# Patient Record
Sex: Male | Born: 1955
Health system: Southern US, Community
[De-identification: ages and names within clinical notes are randomized; demographics above are authoritative.]

## PROBLEM LIST (undated history)

## (undated) DIAGNOSIS — R011 Cardiac murmur, unspecified: Secondary | ICD-10-CM

## (undated) DIAGNOSIS — D649 Anemia, unspecified: Secondary | ICD-10-CM

## (undated) DIAGNOSIS — M1990 Unspecified osteoarthritis, unspecified site: Secondary | ICD-10-CM

## (undated) DIAGNOSIS — E860 Dehydration: Secondary | ICD-10-CM

## (undated) DIAGNOSIS — I10 Essential (primary) hypertension: Secondary | ICD-10-CM

## (undated) DIAGNOSIS — A419 Sepsis, unspecified organism: Secondary | ICD-10-CM

## (undated) DIAGNOSIS — Z01810 Encounter for preprocedural cardiovascular examination: Secondary | ICD-10-CM

## (undated) DIAGNOSIS — F1010 Alcohol abuse, uncomplicated: Secondary | ICD-10-CM

## (undated) DIAGNOSIS — R509 Fever, unspecified: Secondary | ICD-10-CM

## (undated) DIAGNOSIS — R6521 Severe sepsis with septic shock: Secondary | ICD-10-CM

## (undated) DIAGNOSIS — K219 Gastro-esophageal reflux disease without esophagitis: Secondary | ICD-10-CM

## (undated) DIAGNOSIS — K7460 Unspecified cirrhosis of liver: Secondary | ICD-10-CM

## (undated) DIAGNOSIS — K920 Hematemesis: Secondary | ICD-10-CM

## (undated) DIAGNOSIS — R6510 Systemic inflammatory response syndrome (SIRS) of non-infectious origin without acute organ dysfunction: Secondary | ICD-10-CM

## (undated) DIAGNOSIS — D696 Thrombocytopenia, unspecified: Secondary | ICD-10-CM

## (undated) DIAGNOSIS — I219 Acute myocardial infarction, unspecified: Secondary | ICD-10-CM

## (undated) DIAGNOSIS — Z9289 Personal history of other medical treatment: Secondary | ICD-10-CM

## (undated) DIAGNOSIS — E785 Hyperlipidemia, unspecified: Secondary | ICD-10-CM

## (undated) DIAGNOSIS — M7989 Other specified soft tissue disorders: Secondary | ICD-10-CM

## (undated) DIAGNOSIS — I639 Cerebral infarction, unspecified: Secondary | ICD-10-CM

## (undated) HISTORY — PX: KNEE ARTHROSCOPY: SUR90

## (undated) HISTORY — DX: Acute myocardial infarction, unspecified: I21.9

## (undated) HISTORY — DX: Cardiac murmur, unspecified: R01.1

## (undated) HISTORY — DX: Alcohol abuse, uncomplicated: F10.10

## (undated) HISTORY — DX: Hyperlipidemia, unspecified: E78.5

## (undated) HISTORY — DX: Essential (primary) hypertension: I10

## (undated) HISTORY — PX: JOINT REPLACEMENT: SHX530

## (undated) HISTORY — DX: Thrombocytopenia, unspecified: D69.6

## (undated) HISTORY — DX: Anemia, unspecified: D64.9

## (undated) HISTORY — DX: Other specified soft tissue disorders: M79.89

## (undated) HISTORY — DX: Cerebral infarction, unspecified: I63.9

## (undated) HISTORY — PX: HERNIA REPAIR: SHX51

## (undated) HISTORY — DX: Unspecified cirrhosis of liver: K74.60

---

## 2000-08-31 ENCOUNTER — Emergency Department (HOSPITAL_COMMUNITY): Admission: EM | Admit: 2000-08-31 | Discharge: 2000-08-31 | Payer: Self-pay | Admitting: Emergency Medicine

## 2000-08-31 ENCOUNTER — Encounter: Payer: Self-pay | Admitting: Emergency Medicine

## 2004-04-11 ENCOUNTER — Emergency Department (HOSPITAL_COMMUNITY): Admission: EM | Admit: 2004-04-11 | Discharge: 2004-04-11 | Payer: Self-pay | Admitting: Family Medicine

## 2004-05-06 ENCOUNTER — Ambulatory Visit (HOSPITAL_BASED_OUTPATIENT_CLINIC_OR_DEPARTMENT_OTHER): Admission: RE | Admit: 2004-05-06 | Discharge: 2004-05-06 | Payer: Self-pay | Admitting: Orthopedic Surgery

## 2005-10-17 ENCOUNTER — Encounter: Admission: RE | Admit: 2005-10-17 | Discharge: 2005-10-17 | Payer: Self-pay | Admitting: Family Medicine

## 2007-02-24 ENCOUNTER — Emergency Department (HOSPITAL_COMMUNITY): Admission: EM | Admit: 2007-02-24 | Discharge: 2007-02-24 | Payer: Self-pay | Admitting: Emergency Medicine

## 2007-10-27 ENCOUNTER — Emergency Department (HOSPITAL_COMMUNITY): Admission: EM | Admit: 2007-10-27 | Discharge: 2007-10-27 | Payer: Self-pay | Admitting: Emergency Medicine

## 2009-10-26 DIAGNOSIS — I639 Cerebral infarction, unspecified: Secondary | ICD-10-CM

## 2009-10-26 HISTORY — DX: Cerebral infarction, unspecified: I63.9

## 2009-11-01 ENCOUNTER — Encounter (INDEPENDENT_AMBULATORY_CARE_PROVIDER_SITE_OTHER): Payer: Self-pay | Admitting: Internal Medicine

## 2009-11-01 ENCOUNTER — Inpatient Hospital Stay (HOSPITAL_COMMUNITY): Admission: EM | Admit: 2009-11-01 | Discharge: 2009-11-03 | Payer: Self-pay | Admitting: Emergency Medicine

## 2009-11-02 ENCOUNTER — Encounter (INDEPENDENT_AMBULATORY_CARE_PROVIDER_SITE_OTHER): Payer: Self-pay | Admitting: Internal Medicine

## 2009-11-14 ENCOUNTER — Inpatient Hospital Stay (HOSPITAL_COMMUNITY): Admission: EM | Admit: 2009-11-14 | Discharge: 2009-11-14 | Payer: Self-pay | Admitting: Emergency Medicine

## 2009-11-14 ENCOUNTER — Encounter (INDEPENDENT_AMBULATORY_CARE_PROVIDER_SITE_OTHER): Payer: Self-pay | Admitting: Internal Medicine

## 2010-04-28 DIAGNOSIS — I219 Acute myocardial infarction, unspecified: Secondary | ICD-10-CM

## 2010-04-28 DIAGNOSIS — I639 Cerebral infarction, unspecified: Secondary | ICD-10-CM

## 2010-04-28 HISTORY — DX: Cerebral infarction, unspecified: I63.9

## 2010-04-28 HISTORY — DX: Acute myocardial infarction, unspecified: I21.9

## 2010-05-19 ENCOUNTER — Encounter: Payer: Self-pay | Admitting: Family Medicine

## 2010-07-13 LAB — CARDIAC PANEL(CRET KIN+CKTOT+MB+TROPI)
CK, MB: 1.4 ng/mL (ref 0.3–4.0)
Relative Index: 1.2 (ref 0.0–2.5)
Total CK: 115 U/L (ref 7–232)
Troponin I: <0.01 ng/mL (ref 0.00–0.06)

## 2010-07-13 LAB — CK TOTAL AND CKMB (NOT AT ARMC)
CK, MB: 1.6 ng/mL (ref 0.3–4.0)
Relative Index: 1.2 (ref 0.0–2.5)
Total CK: 135 U/L (ref 7–232)

## 2010-07-13 LAB — CBC
HCT: 37.1 % — ABNORMAL LOW (ref 39.0–52.0)
Hemoglobin: 12.3 g/dL — ABNORMAL LOW (ref 13.0–17.0)
MCH: 33.1 pg (ref 26.0–34.0)
MCHC: 33.2 g/dL (ref 30.0–36.0)
MCV: 99.7 fL (ref 78.0–100.0)
Platelets: 191 10*3/uL (ref 150–400)
RBC: 3.73 MIL/uL — ABNORMAL LOW (ref 4.22–5.81)
RDW: 16.6 % — ABNORMAL HIGH (ref 11.5–15.5)
WBC: 8.4 10*3/uL (ref 4.0–10.5)

## 2010-07-13 LAB — BASIC METABOLIC PANEL WITH GFR
BUN: 2 mg/dL — ABNORMAL LOW (ref 6–23)
CO2: 27 meq/L (ref 19–32)
Calcium: 8.4 mg/dL (ref 8.4–10.5)
Chloride: 107 meq/L (ref 96–112)
Creatinine, Ser: 0.62 mg/dL (ref 0.4–1.5)
GFR calc non Af Amer: >60 mL/min
Glucose, Bld: 91 mg/dL (ref 70–99)
Potassium: 3.9 meq/L (ref 3.5–5.1)
Sodium: 139 meq/L (ref 135–145)

## 2010-07-13 LAB — DIFFERENTIAL
Basophils Absolute: 0.1 10*3/uL (ref 0.0–0.1)
Basophils Relative: 1 % (ref 0–1)
Eosinophils Absolute: 0.4 10*3/uL (ref 0.0–0.7)
Eosinophils Relative: 5 % (ref 0–5)
Lymphocytes Relative: 42 % (ref 12–46)
Lymphs Abs: 3.5 10*3/uL (ref 0.7–4.0)
Monocytes Absolute: 0.6 10*3/uL (ref 0.1–1.0)
Monocytes Relative: 8 % (ref 3–12)
Neutro Abs: 3.7 10*3/uL (ref 1.7–7.7)
Neutrophils Relative %: 45 % (ref 43–77)

## 2010-07-13 LAB — RAPID URINE DRUG SCREEN, HOSP PERFORMED
Amphetamines: NOT DETECTED
Barbiturates: NOT DETECTED
Benzodiazepines: NOT DETECTED
Cocaine: NOT DETECTED
Opiates: POSITIVE — AB
Tetrahydrocannabinol: NOT DETECTED

## 2010-07-13 LAB — POCT CARDIAC MARKERS
CKMB, poc: <1 ng/mL — ABNORMAL LOW (ref 1.0–8.0)
Myoglobin, poc: 40 ng/mL (ref 12–200)
Troponin i, poc: <0.05 ng/mL (ref 0.00–0.09)

## 2010-07-13 LAB — PROTIME-INR
INR: 1.17 (ref 0.00–1.49)
Prothrombin Time: 14.8 s (ref 11.6–15.2)

## 2010-07-13 LAB — APTT: aPTT: 30 s (ref 24–37)

## 2010-07-13 LAB — TSH: TSH: 0.491 u[IU]/mL (ref 0.350–4.500)

## 2010-07-13 LAB — TROPONIN I

## 2010-07-14 LAB — DIFFERENTIAL
Basophils Absolute: 0 10*3/uL (ref 0.0–0.1)
Basophils Relative: 1 % (ref 0–1)
Eosinophils Absolute: 0.3 10*3/uL (ref 0.0–0.7)
Eosinophils Relative: 4 % (ref 0–5)
Lymphocytes Relative: 34 % (ref 12–46)
Lymphs Abs: 2.8 10*3/uL (ref 0.7–4.0)
Monocytes Absolute: 0.7 10*3/uL (ref 0.1–1.0)
Monocytes Relative: 9 % (ref 3–12)
Neutro Abs: 4.3 10*3/uL (ref 1.7–7.7)
Neutrophils Relative %: 53 % (ref 43–77)

## 2010-07-14 LAB — CARDIAC PANEL(CRET KIN+CKTOT+MB+TROPI)
CK, MB: 0.9 ng/mL (ref 0.3–4.0)
CK, MB: 1.1 ng/mL (ref 0.3–4.0)
Relative Index: 1 (ref 0.0–2.5)
Relative Index: INVALID (ref 0.0–2.5)
Total CK: 109 U/L (ref 7–232)
Total CK: 86 U/L (ref 7–232)
Troponin I: 0.01 ng/mL (ref 0.00–0.06)
Troponin I: 0.02 ng/mL (ref 0.00–0.06)

## 2010-07-14 LAB — BASIC METABOLIC PANEL
BUN: 5 mg/dL — ABNORMAL LOW (ref 6–23)
CO2: 25 mEq/L (ref 19–32)
Calcium: 8.2 mg/dL — ABNORMAL LOW (ref 8.4–10.5)
Chloride: 103 mEq/L (ref 96–112)
Creatinine, Ser: 0.66 mg/dL (ref 0.4–1.5)
GFR calc Af Amer: >60 mL/min (ref >60)
GFR calc non Af Amer: >60 mL/min (ref >60)
Glucose, Bld: 96 mg/dL (ref 70–99)
Potassium: 3.5 mEq/L (ref 3.5–5.1)
Sodium: 134 mEq/L — ABNORMAL LOW (ref 135–145)

## 2010-07-14 LAB — HEMOGLOBIN A1C
Hgb A1c MFr Bld: 5.1 % (ref <5.7)
Mean Plasma Glucose: 100 mg/dL (ref <117)

## 2010-07-14 LAB — PROTIME-INR
INR: 1.26 (ref 0.00–1.49)
Prothrombin Time: 15.7 seconds — ABNORMAL HIGH (ref 11.6–15.2)

## 2010-07-14 LAB — COMPREHENSIVE METABOLIC PANEL
ALT: 26 U/L (ref 0–53)
AST: 89 U/L — ABNORMAL HIGH (ref 0–37)
Albumin: 3.2 g/dL — ABNORMAL LOW (ref 3.5–5.2)
Alkaline Phosphatase: 165 U/L — ABNORMAL HIGH (ref 39–117)
BUN: 2 mg/dL — ABNORMAL LOW (ref 6–23)
CO2: 24 mEq/L (ref 19–32)
Calcium: 8 mg/dL — ABNORMAL LOW (ref 8.4–10.5)
Chloride: 102 mEq/L (ref 96–112)
Creatinine, Ser: 0.51 mg/dL (ref 0.4–1.5)
GFR calc Af Amer: >60 mL/min (ref >60)
GFR calc non Af Amer: >60 mL/min (ref >60)
Glucose, Bld: 87 mg/dL (ref 70–99)
Potassium: 3.6 mEq/L (ref 3.5–5.1)
Sodium: 133 mEq/L — ABNORMAL LOW (ref 135–145)
Total Bilirubin: 1.1 mg/dL (ref 0.3–1.2)
Total Protein: 7.2 g/dL (ref 6.0–8.3)

## 2010-07-14 LAB — LIPID PANEL
Cholesterol: 149 mg/dL (ref 0–200)
HDL: 33 mg/dL — ABNORMAL LOW (ref >39)
LDL Cholesterol: 104 mg/dL — ABNORMAL HIGH (ref 0–99)
Total CHOL/HDL Ratio: 4.5 RATIO
Triglycerides: 59 mg/dL (ref <150)
VLDL: 12 mg/dL (ref 0–40)

## 2010-07-14 LAB — CBC
HCT: 35 % — ABNORMAL LOW (ref 39.0–52.0)
Hemoglobin: 11.9 g/dL — ABNORMAL LOW (ref 13.0–17.0)
MCH: 33.6 pg (ref 26.0–34.0)
MCHC: 33.8 g/dL (ref 30.0–36.0)
MCV: 99.4 fL (ref 78.0–100.0)
Platelets: 143 10*3/uL — ABNORMAL LOW (ref 150–400)
RBC: 3.52 MIL/uL — ABNORMAL LOW (ref 4.22–5.81)
RDW: 16.3 % — ABNORMAL HIGH (ref 11.5–15.5)
WBC: 8.2 10*3/uL (ref 4.0–10.5)

## 2010-07-14 LAB — GLUCOSE, CAPILLARY
Glucose-Capillary: 106 mg/dL — ABNORMAL HIGH (ref 70–99)
Glucose-Capillary: 114 mg/dL — ABNORMAL HIGH (ref 70–99)
Glucose-Capillary: 140 mg/dL — ABNORMAL HIGH (ref 70–99)
Glucose-Capillary: 83 mg/dL (ref 70–99)
Glucose-Capillary: 91 mg/dL (ref 70–99)

## 2010-07-14 LAB — CK TOTAL AND CKMB (NOT AT ARMC)
CK, MB: 1.6 ng/mL (ref 0.3–4.0)
Relative Index: 0.7 (ref 0.0–2.5)
Total CK: 217 U/L (ref 7–232)

## 2010-07-14 LAB — TROPONIN I: Troponin I: 0.03 ng/mL (ref 0.00–0.06)

## 2010-07-14 LAB — APTT: aPTT: 34 seconds (ref 24–37)

## 2010-07-16 ENCOUNTER — Inpatient Hospital Stay (HOSPITAL_COMMUNITY)
Admission: EM | Admit: 2010-07-16 | Discharge: 2010-07-19 | DRG: 557 | Disposition: A | Discharge status: Home / Self Care | Payer: BC Managed Care – PPO | Attending: Family Medicine | Admitting: Family Medicine

## 2010-07-16 ENCOUNTER — Emergency Department (HOSPITAL_COMMUNITY): Payer: BC Managed Care – PPO

## 2010-07-16 DIAGNOSIS — F10229 Alcohol dependence with intoxication, unspecified: Secondary | ICD-10-CM | POA: Diagnosis present

## 2010-07-16 DIAGNOSIS — I498 Other specified cardiac arrhythmias: Secondary | ICD-10-CM | POA: Diagnosis present

## 2010-07-16 DIAGNOSIS — D649 Anemia, unspecified: Secondary | ICD-10-CM | POA: Diagnosis present

## 2010-07-16 DIAGNOSIS — K7460 Unspecified cirrhosis of liver: Principal | ICD-10-CM | POA: Diagnosis present

## 2010-07-16 DIAGNOSIS — Z8673 Personal history of transient ischemic attack (TIA), and cerebral infarction without residual deficits: Secondary | ICD-10-CM

## 2010-07-16 DIAGNOSIS — E785 Hyperlipidemia, unspecified: Secondary | ICD-10-CM | POA: Diagnosis present

## 2010-07-16 DIAGNOSIS — E8809 Other disorders of plasma-protein metabolism, not elsewhere classified: Secondary | ICD-10-CM | POA: Diagnosis present

## 2010-07-16 DIAGNOSIS — F17200 Nicotine dependence, unspecified, uncomplicated: Secondary | ICD-10-CM | POA: Diagnosis present

## 2010-07-16 DIAGNOSIS — R188 Other ascites: Secondary | ICD-10-CM | POA: Diagnosis present

## 2010-07-16 DIAGNOSIS — D638 Anemia in other chronic diseases classified elsewhere: Secondary | ICD-10-CM | POA: Diagnosis present

## 2010-07-16 DIAGNOSIS — I10 Essential (primary) hypertension: Secondary | ICD-10-CM | POA: Diagnosis present

## 2010-07-16 DIAGNOSIS — I81 Portal vein thrombosis: Secondary | ICD-10-CM | POA: Diagnosis present

## 2010-07-16 DIAGNOSIS — Z7982 Long term (current) use of aspirin: Secondary | ICD-10-CM

## 2010-07-16 DIAGNOSIS — D696 Thrombocytopenia, unspecified: Secondary | ICD-10-CM | POA: Diagnosis present

## 2010-07-16 DIAGNOSIS — F1010 Alcohol abuse, uncomplicated: Secondary | ICD-10-CM | POA: Diagnosis present

## 2010-07-16 LAB — RAPID URINE DRUG SCREEN, HOSP PERFORMED
Amphetamines: NOT DETECTED
Barbiturates: NOT DETECTED
Benzodiazepines: NOT DETECTED
Cocaine: NOT DETECTED
Opiates: POSITIVE — AB
Tetrahydrocannabinol: POSITIVE — AB

## 2010-07-16 LAB — PROTIME-INR
INR: 1.48 (ref 0.00–1.49)
Prothrombin Time: 18.1 seconds — ABNORMAL HIGH (ref 11.6–15.2)

## 2010-07-16 LAB — OCCULT BLOOD, POC DEVICE: Fecal Occult Bld: NEGATIVE

## 2010-07-16 LAB — COMPREHENSIVE METABOLIC PANEL
ALT: 25 U/L (ref 0–53)
AST: 127 U/L — ABNORMAL HIGH (ref 0–37)
Albumin: 2 g/dL — ABNORMAL LOW (ref 3.5–5.2)
Alkaline Phosphatase: 150 U/L — ABNORMAL HIGH (ref 39–117)
BUN: <1 mg/dL — ABNORMAL LOW (ref 6–23)
CO2: 26 mEq/L (ref 19–32)
Calcium: 7.8 mg/dL — ABNORMAL LOW (ref 8.4–10.5)
Chloride: 103 mEq/L (ref 96–112)
Creatinine, Ser: 0.6 mg/dL (ref 0.4–1.5)
GFR calc Af Amer: >60 mL/min (ref >60)
GFR calc non Af Amer: >60 mL/min (ref >60)
Glucose, Bld: 110 mg/dL — ABNORMAL HIGH (ref 70–99)
Potassium: 3.6 mEq/L (ref 3.5–5.1)
Sodium: 135 mEq/L (ref 135–145)
Total Bilirubin: 2.8 mg/dL — ABNORMAL HIGH (ref 0.3–1.2)
Total Protein: 7.3 g/dL (ref 6.0–8.3)

## 2010-07-16 LAB — URINALYSIS, ROUTINE W REFLEX MICROSCOPIC
Bilirubin Urine: NEGATIVE
Glucose, UA: NEGATIVE mg/dL
Hgb urine dipstick: NEGATIVE
Ketones, ur: NEGATIVE mg/dL
Nitrite: NEGATIVE
Protein, ur: NEGATIVE mg/dL
Specific Gravity, Urine: 1.009 (ref 1.005–1.030)
Urobilinogen, UA: 1 mg/dL (ref 0.0–1.0)
pH: 5.5 (ref 5.0–8.0)

## 2010-07-16 LAB — DIFFERENTIAL
Basophils Absolute: 0.1 10*3/uL (ref 0.0–0.1)
Basophils Relative: 1 % (ref 0–1)
Eosinophils Absolute: 0.1 10*3/uL (ref 0.0–0.7)
Eosinophils Relative: 1 % (ref 0–5)
Lymphocytes Relative: 26 % (ref 12–46)
Lymphs Abs: 2.3 10*3/uL (ref 0.7–4.0)
Monocytes Absolute: 0.7 10*3/uL (ref 0.1–1.0)
Monocytes Relative: 7 % (ref 3–12)
Neutro Abs: 5.8 10*3/uL (ref 1.7–7.7)
Neutrophils Relative %: 65 % (ref 43–77)

## 2010-07-16 LAB — CBC
HCT: 28 % — ABNORMAL LOW (ref 39.0–52.0)
Hemoglobin: 9.4 g/dL — ABNORMAL LOW (ref 13.0–17.0)
MCH: 30.5 pg (ref 26.0–34.0)
MCHC: 33.6 g/dL (ref 30.0–36.0)
MCV: 90.9 fL (ref 78.0–100.0)
Platelets: 129 10*3/uL — ABNORMAL LOW (ref 150–400)
RBC: 3.08 MIL/uL — ABNORMAL LOW (ref 4.22–5.81)
RDW: 17.9 % — ABNORMAL HIGH (ref 11.5–15.5)
WBC: 9 10*3/uL (ref 4.0–10.5)

## 2010-07-16 LAB — BRAIN NATRIURETIC PEPTIDE: Pro B Natriuretic peptide (BNP): 119 pg/mL — ABNORMAL HIGH (ref 0.0–100.0)

## 2010-07-16 LAB — ETHANOL: Alcohol, Ethyl (B): 126 mg/dL — ABNORMAL HIGH (ref 0–10)

## 2010-07-16 LAB — APTT: aPTT: 37 seconds (ref 24–37)

## 2010-07-16 LAB — AMMONIA: Ammonia: 48 umol/L — ABNORMAL HIGH (ref 11–35)

## 2010-07-17 ENCOUNTER — Inpatient Hospital Stay (HOSPITAL_COMMUNITY): Payer: BC Managed Care – PPO

## 2010-07-17 LAB — CBC
HCT: 27.7 % — ABNORMAL LOW (ref 39.0–52.0)
Hemoglobin: 9.4 g/dL — ABNORMAL LOW (ref 13.0–17.0)
MCH: 30.8 pg (ref 26.0–34.0)
MCHC: 33.9 g/dL (ref 30.0–36.0)
MCV: 90.8 fL (ref 78.0–100.0)
Platelets: 125 10*3/uL — ABNORMAL LOW (ref 150–400)
RBC: 3.05 MIL/uL — ABNORMAL LOW (ref 4.22–5.81)
RDW: 18 % — ABNORMAL HIGH (ref 11.5–15.5)
WBC: 7.8 10*3/uL (ref 4.0–10.5)

## 2010-07-17 LAB — COMPREHENSIVE METABOLIC PANEL
ALT: 21 U/L (ref 0–53)
AST: 111 U/L — ABNORMAL HIGH (ref 0–37)
Albumin: 1.9 g/dL — ABNORMAL LOW (ref 3.5–5.2)
Alkaline Phosphatase: 135 U/L — ABNORMAL HIGH (ref 39–117)
BUN: <1 mg/dL — ABNORMAL LOW (ref 6–23)
CO2: 24 mEq/L (ref 19–32)
Calcium: 7.8 mg/dL — ABNORMAL LOW (ref 8.4–10.5)
Chloride: 103 mEq/L (ref 96–112)
Creatinine, Ser: 0.5 mg/dL (ref 0.4–1.5)
GFR calc Af Amer: >60 mL/min (ref >60)
GFR calc non Af Amer: >60 mL/min (ref >60)
Glucose, Bld: 80 mg/dL (ref 70–99)
Potassium: 3.7 mEq/L (ref 3.5–5.1)
Sodium: 135 mEq/L (ref 135–145)
Total Bilirubin: 3.1 mg/dL — ABNORMAL HIGH (ref 0.3–1.2)
Total Protein: 6.8 g/dL (ref 6.0–8.3)

## 2010-07-17 LAB — ALBUMIN: Albumin: 1.9 g/dL — ABNORMAL LOW (ref 3.5–5.2)

## 2010-07-17 LAB — IRON AND TIBC
Iron: 55 ug/dL (ref 42–135)
Saturation Ratios: 26 % (ref 20–55)
TIBC: 212 ug/dL — ABNORMAL LOW (ref 215–435)
UIBC: 157 ug/dL

## 2010-07-17 LAB — VITAMIN B12: Vitamin B-12: 1249 pg/mL — ABNORMAL HIGH (ref 211–911)

## 2010-07-17 LAB — AMMONIA: Ammonia: 68 umol/L — ABNORMAL HIGH (ref 11–35)

## 2010-07-17 LAB — FOLATE: Folate: 3.9 ng/mL

## 2010-07-17 LAB — FERRITIN: Ferritin: 43 ng/mL (ref 22–322)

## 2010-07-17 LAB — TSH: TSH: 2.661 u[IU]/mL (ref 0.350–4.500)

## 2010-07-18 ENCOUNTER — Inpatient Hospital Stay (HOSPITAL_COMMUNITY): Payer: BC Managed Care – PPO

## 2010-07-18 LAB — COMPREHENSIVE METABOLIC PANEL
ALT: 20 U/L (ref 0–53)
AST: 89 U/L — ABNORMAL HIGH (ref 0–37)
Albumin: 1.8 g/dL — ABNORMAL LOW (ref 3.5–5.2)
Alkaline Phosphatase: 130 U/L — ABNORMAL HIGH (ref 39–117)
BUN: 1 mg/dL — ABNORMAL LOW (ref 6–23)
CO2: 27 mEq/L (ref 19–32)
Calcium: 7.7 mg/dL — ABNORMAL LOW (ref 8.4–10.5)
Chloride: 104 mEq/L (ref 96–112)
Creatinine, Ser: 0.6 mg/dL (ref 0.4–1.5)
GFR calc Af Amer: >60 mL/min (ref >60)
GFR calc non Af Amer: >60 mL/min (ref >60)
Glucose, Bld: 90 mg/dL (ref 70–99)
Potassium: 3.5 mEq/L (ref 3.5–5.1)
Sodium: 136 mEq/L (ref 135–145)
Total Bilirubin: 3.2 mg/dL — ABNORMAL HIGH (ref 0.3–1.2)
Total Protein: 6.7 g/dL (ref 6.0–8.3)

## 2010-07-18 LAB — CBC
HCT: 27.6 % — ABNORMAL LOW (ref 39.0–52.0)
Hemoglobin: 9.4 g/dL — ABNORMAL LOW (ref 13.0–17.0)
MCH: 31 pg (ref 26.0–34.0)
MCHC: 34.1 g/dL (ref 30.0–36.0)
MCV: 91.1 fL (ref 78.0–100.0)
Platelets: 120 10*3/uL — ABNORMAL LOW (ref 150–400)
RBC: 3.03 MIL/uL — ABNORMAL LOW (ref 4.22–5.81)
RDW: 18.4 % — ABNORMAL HIGH (ref 11.5–15.5)
WBC: 8.5 10*3/uL (ref 4.0–10.5)

## 2010-07-18 LAB — PROTIME-INR
INR: 1.67 — ABNORMAL HIGH (ref 0.00–1.49)
Prothrombin Time: 19.9 seconds — ABNORMAL HIGH (ref 11.6–15.2)

## 2010-07-18 LAB — AMMONIA: Ammonia: 61 umol/L — ABNORMAL HIGH (ref 11–35)

## 2010-07-19 DIAGNOSIS — K7010 Alcoholic hepatitis without ascites: Secondary | ICD-10-CM

## 2010-07-19 LAB — CBC
HCT: 28 % — ABNORMAL LOW (ref 39.0–52.0)
Hemoglobin: 9.5 g/dL — ABNORMAL LOW (ref 13.0–17.0)
MCH: 30.6 pg (ref 26.0–34.0)
MCHC: 33.9 g/dL (ref 30.0–36.0)
MCV: 90.3 fL (ref 78.0–100.0)
Platelets: 141 10*3/uL — ABNORMAL LOW (ref 150–400)
RBC: 3.1 MIL/uL — ABNORMAL LOW (ref 4.22–5.81)
RDW: 18.6 % — ABNORMAL HIGH (ref 11.5–15.5)
WBC: 8.6 10*3/uL (ref 4.0–10.5)

## 2010-07-19 LAB — COMPREHENSIVE METABOLIC PANEL
ALT: 19 U/L (ref 0–53)
AST: 86 U/L — ABNORMAL HIGH (ref 0–37)
Albumin: 1.9 g/dL — ABNORMAL LOW (ref 3.5–5.2)
Alkaline Phosphatase: 126 U/L — ABNORMAL HIGH (ref 39–117)
BUN: 2 mg/dL — ABNORMAL LOW (ref 6–23)
CO2: 27 mEq/L (ref 19–32)
Calcium: 7.9 mg/dL — ABNORMAL LOW (ref 8.4–10.5)
Chloride: 102 mEq/L (ref 96–112)
Creatinine, Ser: 0.71 mg/dL (ref 0.4–1.5)
GFR calc Af Amer: >60 mL/min (ref >60)
GFR calc non Af Amer: >60 mL/min (ref >60)
Glucose, Bld: 95 mg/dL (ref 70–99)
Potassium: 3.5 mEq/L (ref 3.5–5.1)
Sodium: 133 mEq/L — ABNORMAL LOW (ref 135–145)
Total Bilirubin: 3 mg/dL — ABNORMAL HIGH (ref 0.3–1.2)
Total Protein: 6.7 g/dL (ref 6.0–8.3)

## 2010-07-19 LAB — PROTIME-INR
INR: 1.57 — ABNORMAL HIGH (ref 0.00–1.49)
Prothrombin Time: 19 seconds — ABNORMAL HIGH (ref 11.6–15.2)

## 2010-07-19 LAB — AMMONIA: Ammonia: 23 umol/L (ref 11–35)

## 2010-07-20 NOTE — H&P (Signed)
NAME:  Mark [name], Mark [name] NO.:  [id number]  MEDICAL RECORD NO.:  [id number]           PATIENT TYPE:  E  LOCATION:  [facility]                         FACILITY:  [facility]  PHYSICIAN:  Della Goo, M.D. DATE OF BIRTH:  [birth year]  DATE OF ADMISSION:  07/17/2010 DATE OF DISCHARGE:                             HISTORY & PHYSICAL   PRIMARY CARE PHYSICIAN:  None.  CHIEF COMPLAINT:  Swelling all over.  HISTORY OF PRESENT ILLNESS:  This is a 55 year old male with a history of alcohol abuse who presents to the emergency department secondary to worsening swelling over the past week.  He reports the swelling began in his feet and progressed up the legs, groin, and abdomen.  He denies having any shortness of breath.  He also denies having any chest pain. He denies having any fevers or chills.  The patient also denies having any nausea or vomiting or diarrhea.  The patient's wife is at the bedside and reports that he eats very little.  He does report that he drinks 4-5 beers that are 12 ounces each daily.  He also states he drinks 2-3 shots of liquor daily as well and has been drinking for 10+ years.  He has had several hospital admissions since December 2005 and each hospital admission reports a history of alcohol abuse.  PAST MEDICAL HISTORY:  Significant for: 1. Alcohol abuse. 2. Transient ischemic attack. 3. SVT. 4. Dyslipidemia.  PAST SURGICAL HISTORY:  History of bilateral knee arthroscopic surgeries for meniscal tears.  MEDICATIONS:  At this time include aspirin and simvastatin, but this will need to be further verified.  ALLERGIES:  No known drug allergies.  SOCIAL HISTORY:  The patient is married.  He does smoke 4 cigarettes daily.  He reports drinking 4-5 12-ounce beers daily and 2-3 shots of liquor daily for 10+ years.  He also reports smoking marijuana twice a week.  He denies any cocaine usage and he denies any other illicit drug usage.  However, his urine  drug screen is also positive for opiates.  FAMILY HISTORY:  Noncontributory.  REVIEW OF SYSTEMS:  Pertinent as mentioned above.  PHYSICAL EXAMINATION FINDINGS:  GENERAL:  This is a 55 year old African American male who is in no acute distress currently. VITAL SIGNS:  Temperature 98.4, blood pressure 121/66, heart rate 97, respirations 16, and O2 sats 97-99%. HEENT:  Normocephalic and atraumatic.  Sclerae are erythematous and muddy.  Pupils are equally round and reactive to light.  Extraocular movements are intact.  Funduscopic benign.  Nares are patent bilaterally.  Oropharynx is clear. NECK:  Supple and full range of motion.  No thyromegaly or adenopathy. There is no jugular venous distention.  No thyromegaly. CHEST:  Chest wall, normal excursion bilaterally.  Regular rate and rhythm.  No murmurs, gallops, or rubs appreciated. LUNGS:  Clear to auscultation bilaterally. ABDOMEN:  Distended.  Nontender to palpation.  Unable to palpate hepatosplenomegaly on examination at this time.  There is a positive fluid wave. EXTREMITIES:  With 2+ edema which is pitting. NEUROLOGIC:  The patient is agitated and angry.  His speech is clear. He is able to  move all 4 of his extremities.  There is no pronator drifting.  There are no focal deficits on examination.  There is no asterixis. RECTAL:  Performed by the EDP and the fecal occult blood testing was Hemoccult negative.  LABORATORY STUDIES:  White blood cell count 9.0, hemoglobin 9.4, hematocrit 28.0, MCV 90.9, platelets 129, neutrophils 65%, lymphocytes 26%.  Protime 18.1, INR 1.48, PTT 37.  Ammonia level mildly elevated at 48, normal ranges 11-35.  Sodium 135, potassium 3.6, chloride 103, carbon dioxide 26, BUN less than 1, creatinine 0.60, and glucose 110. Albumin 2.0, total protein 7.3, AST of 127, ALT 25, total bilirubin 2.8, and alkaline phosphatase elevated at 150.  Beta-natriuretic peptide 119. Alcohol level 126.  Urine drug screen  positive for opiates, positive for cannabis.  Acute abdominal series reveals clear lung fields.  No pleural effusion.  No free intraperitoneal air.  ASSESSMENT:  This is a 55 year old male being admitted with: 1. Anasarca/ascites/edema. 2. Alcohol abuse. 3. Alcohol intoxication. 4. Hypoalbuminemia. 5. Cirrhosis. 6. Anemia. 7. Thrombocytopenia.  PLAN:  The patient will be admitted for treatment of the anasarca.  The cause of this anasarca is secondary to his hypoalbuminemia from his cirrhosis and alcohol abuse.  The patient has been counseled in regard to his alcoholism and will be placed on the alcohol withdrawal protocol to prevent alcohol withdrawal symptoms.  The patient will be placed on diuretic therapy to help improve the edema.  However, his protein level will need to improve as well.  Spironolactone has also been added to this regimen.  The patient's electrolytes will be monitored and repleted as needed as well.  His liver function tests are elevated as well consistent with alcoholic cirrhosis.  His ammonia level will also be monitored as well since this is mildly elevated.  Substance abuse counseling will be ordered for this patient while he is hospitalized and Behavioral Health/Psychiatry will be consulted to offer this patient resources to help him discontinue his alcohol habit.     Della Goo, M.D.    HJ/MEDQ  D:  07/17/2010  T:  07/17/2010  Job:  [id number]  Electronically Signed by Della Goo M.D. on 07/20/2010 11:28:14 PM

## 2010-07-28 NOTE — Discharge Summary (Signed)
NAME:  Mark [name], Mark [name] NO.:  [id number]  MEDICAL RECORD NO.:  [id number]           PATIENT TYPE:  LOCATION:                                 FACILITY:  PHYSICIAN:  Pleas Koch, MD        DATE OF BIRTH:  [birth year]  DATE OF ADMISSION:  07/17/2010 DATE OF DISCHARGE:  07/19/2010                              DISCHARGE SUMMARY   DISCHARGE DIAGNOSES: 1. Anasarca plus cirrhosis. 2. Portal vein occlusion. 3. Ethanol abuse. 4. Anemia of chronic disease. 5. Thrombocytopenia. 6. Ascites.  DISCHARGE MEDICATIONS: 1. Aspirin 81 mg daily. 2. Spironolactone 50 mg 1 tablet daily. 3. Lactulose 13 mL b.i.d.  The patient was instructed to stop taking     metoprolol and simvastatin.  PERTINENT IMAGING STUDIES: 1. Abdominal x-ray done, July 16, 2010, showed no acute findings. 2. Abdominal ultrasound, July 18, 2010, showed biliary sludge, but no     discreet gallstone, gallbladder wall thickening, and     pericholecystic fluid present. 3. Liver echotexture density is diffusely dense, heterogeneous, liver     size normal, not lobulated , moderate abdominal ascites present.     Left and right kidneys were normal, right kidney had a 1.8-cm     simple cyst.  No splenomegaly was noted. 4. Ultrasound venous Doppler showed thrombotic portal vein occlusion     and abdominal ascites with patent hepatic veins  HISTORY OF PRESENT ILLNESS:  Please see full dictation by Dr. Lovell Sheehan [phone], full details.  Briefly, this is a 55 year old male with a history of Hepatitis C and Alcoholism presenting to the emergency room with swelling began in his feet right up to the legs  having shortness of breath, chest pain, chills, fever.  Denies having nausea, vomiting.  The patient's wife then reports he eats very little and drinks about drinks 2 or 3 shots of liquor a day as well as arge sizd beers.  The patient also reports smoking marijuana twice a week and has a past polysubstance  h/o. Vitals initially blood pressure 121/56, heart rate was non-tachycardic and gen condition fair.  The patient had no asterixis.  His abdomen was distended, nontender to palpation, unable to palpate hepatosplenomegaly.  There was a positive fluid wave.  LABS:  On admission showed a white count of 9, hemoglobin 9.4, hematocrit 28, platelets 129.  INR 1.48, ammonia 48.  Sodium 135, potassium 3.6, chloride 103, carbon dioxide 26, BUN 1, creatinine 0.6, glucose 110, albumin 2.3, AST 127, ALT of 45, total protein of 2.8, alk phos 150, BNP was 115.  HOSPITAL COURSE: 1. Anasarca plus cirrhosis plus portal vein occlusion.  The patient     was seen and elevated liver enzymes noted.  Therefore GI was consulted,     Dr. Christella Hartigan also recommended outpatient followup.  Hepatic     serologies have been done.  The patient was kept initially on Lasix     and Aldactone and will go home on Aldactone 50 mg once daily per GI     The patient was instructed  by both myself     and Gastroenterology to desist  from intake of alcohol and Drugs.  The patient     will need a CMP and CBC on followup with primary care physician     and an appointment has been made with Dr. Chales Salmon at 2:30 p.m. on August 01, 2010.  The patient will continue on lactulose on discharge even though he is not encephalopathic at this time.  The patient has portal vein occlusion and current evidenced based guidelines are equivocal regarding use of Coumadin.  This may likely also secondary to cirrhosis.  The patient has not had a paracentesis in the hospital as he was felt to be low-risk for spontaneous bacterial peritonitis.  Anemia of chronic disease:  This is stable and he had iron studies done in hospital,  which showed iron of 55, total iron binding capacity is 212.  The patient will likely need to desist from alcohol as this may have a toxic effect on blood count.  1. Thrombocytopenia, felt also likely secondary to cirrhosis. 2.  Ascites.  The patient continues spironolactone 50 mg daily and     Lasix will be held and he is seen by GI physician. 3. Hypertension.  The patient was on metoprolol, which has been     discontinued as blood pressures have been stable in the hospital. 4. Hyperlipidemia.  The patient was on Zocor and I would discontinue     this in view of his elevated liver enzymes at present time.  The     patient will likely have dietary modification.      The patient will follow up with Dr. Christella Hartigan as previously mnetioned and was discharged home in     stable state.  Temperature was 98.0, pulse was 95, respirations were 18, blood pressure was 94-111/60-68, and saturating 100% on room air.          ______________________________ Pleas Koch, MD     JS/MEDQ  D:  07/19/2010  T:  07/20/2010  Job:  [id number]  cc:   Rachael Fee, MD  Electronically Signed by Pleas Koch MD on 07/28/2010 05:20:49 AM

## 2010-09-03 ENCOUNTER — Other Ambulatory Visit (INDEPENDENT_AMBULATORY_CARE_PROVIDER_SITE_OTHER): Payer: BC Managed Care – PPO

## 2010-09-03 ENCOUNTER — Ambulatory Visit (INDEPENDENT_AMBULATORY_CARE_PROVIDER_SITE_OTHER): Payer: BC Managed Care – PPO | Admitting: Gastroenterology

## 2010-09-03 ENCOUNTER — Encounter: Payer: Self-pay | Admitting: Gastroenterology

## 2010-09-03 ENCOUNTER — Other Ambulatory Visit: Payer: Self-pay | Admitting: Gastroenterology

## 2010-09-03 VITALS — BP 120/66 | HR 104 | Ht 67.0 in | Wt 151.4 lb

## 2010-09-03 DIAGNOSIS — K7460 Unspecified cirrhosis of liver: Secondary | ICD-10-CM

## 2010-09-03 LAB — BASIC METABOLIC PANEL
BUN: 3 mg/dL — ABNORMAL LOW (ref 6–23)
CO2: 25 mEq/L (ref 19–32)
Calcium: 8 mg/dL — ABNORMAL LOW (ref 8.4–10.5)
Chloride: 107 mEq/L (ref 96–112)
Creatinine, Ser: 0.5 mg/dL (ref 0.4–1.5)
GFR: 225.6 mL/min (ref >60.00)
Glucose, Bld: 83 mg/dL (ref 70–99)
Potassium: 4.3 mEq/L (ref 3.5–5.1)
Sodium: 138 mEq/L (ref 135–145)

## 2010-09-03 LAB — CBC WITH DIFFERENTIAL/PLATELET
Basophils Absolute: 0 10*3/uL (ref 0.0–0.1)
Basophils Relative: 0.6 % (ref 0.0–3.0)
Eosinophils Absolute: 0.1 10*3/uL (ref 0.0–0.7)
Eosinophils Relative: 1 % (ref 0.0–5.0)
HCT: 28.9 % — ABNORMAL LOW (ref 39.0–52.0)
Hemoglobin: 10.1 g/dL — ABNORMAL LOW (ref 13.0–17.0)
Lymphocytes Relative: 19.8 % (ref 12.0–46.0)
Lymphs Abs: 1 10*3/uL (ref 0.7–4.0)
MCHC: 34.9 g/dL (ref 30.0–36.0)
MCV: 101.4 fl — ABNORMAL HIGH (ref 78.0–100.0)
Monocytes Absolute: 0.5 10*3/uL (ref 0.1–1.0)
Monocytes Relative: 9.5 % (ref 3.0–12.0)
Neutro Abs: 3.6 10*3/uL (ref 1.4–7.7)
Neutrophils Relative %: 69.1 % (ref 43.0–77.0)
Platelets: 152 10*3/uL (ref 150.0–400.0)
RBC: 2.85 Mil/uL — ABNORMAL LOW (ref 4.22–5.81)
RDW: 20.5 % — ABNORMAL HIGH (ref 11.5–14.6)
WBC: 5.3 10*3/uL (ref 4.5–10.5)

## 2010-09-03 LAB — COMPREHENSIVE METABOLIC PANEL
ALT: 15 U/L (ref 0–53)
AST: 59 U/L — ABNORMAL HIGH (ref 0–37)
Albumin: 2.2 g/dL — ABNORMAL LOW (ref 3.5–5.2)
Alkaline Phosphatase: 156 U/L — ABNORMAL HIGH (ref 39–117)
BUN: 3 mg/dL — ABNORMAL LOW (ref 6–23)
CO2: 25 mEq/L (ref 19–32)
Calcium: 8 mg/dL — ABNORMAL LOW (ref 8.4–10.5)
Chloride: 107 mEq/L (ref 96–112)
Creatinine, Ser: 0.5 mg/dL (ref 0.4–1.5)
GFR: 225.6 mL/min (ref >60.00)
Glucose, Bld: 83 mg/dL (ref 70–99)
Potassium: 4.3 mEq/L (ref 3.5–5.1)
Sodium: 138 mEq/L (ref 135–145)
Total Bilirubin: 2.3 mg/dL — ABNORMAL HIGH (ref 0.3–1.2)
Total Protein: 7 g/dL (ref 6.0–8.3)

## 2010-09-03 LAB — AFP TUMOR MARKER: AFP-Tumor Marker: 4.1 ng/mL (ref 0.0–8.0)

## 2010-09-03 LAB — IRON AND TIBC
%SAT: 15 % — ABNORMAL LOW (ref 20–55)
Iron: 36 ug/dL — ABNORMAL LOW (ref 42–165)
TIBC: 240 ug/dL (ref 215–435)
UIBC: 204 ug/dL

## 2010-09-03 LAB — IRON: Iron: 39 ug/dL — ABNORMAL LOW (ref 42–165)

## 2010-09-03 LAB — FERRITIN: Ferritin: 15.4 ng/mL — ABNORMAL LOW (ref 22.0–322.0)

## 2010-09-03 LAB — PROTIME-INR
INR: 1.5 ratio — ABNORMAL HIGH (ref 0.8–1.0)
Prothrombin Time: 16.3 s — ABNORMAL HIGH (ref 10.2–12.4)

## 2010-09-03 MED ORDER — FUROSEMIDE 20 MG PO TABS: 20.0000 mg | ORAL_TABLET | Freq: Every day | ORAL | Status: DC

## 2010-09-03 NOTE — Patient Instructions (Addendum)
You need to completely stop drinking alcholol and NEVER restart it.  If you do not stop drinking alcohol, you may die from it. One of your biggest health concerns is your smoking.  You should try your absolute best to stop.  If you need assistence, please contact your PCP or Smoking Cessation Class at [facility] [phone]) or [facility] Quit-Line (1-800-QUIT-NOW). You will have labs checked today in the basement lab.  Please head down after you check out with the front desk  (Hepatitis A (IgM and IgG), Hepatitis B surface antigen, Hepatitis B surface antibody, Hepatitis C antibody, total iron, ferritin, TIBC, ANA, AMA, alphafeto protein (AFP), anti smooth muscle antibody, CBC, CMET, INR). You will have labs checked in 2 weeksin the basement lab  (bmet) It is important that you have a relatively low salt diet.  High salt diet can cause fluid to accumulate in your legs, abdomen and even around your lungs. You should try to avoid NSAID type over the counter pain medicines as best as possible. Tylenol is safe to take for 'routine' aches and pains, but never take more than 1/2 the dose suggested on the package instructions (never more than 2 grams per day). Start lasix (20mg  pill once daily), continue to take the aldactone 50mg  once daily.  This new med was called into your pharmacy already. Return to see Dr. Christella Hartigan in one month.

## 2010-09-03 NOTE — Progress Notes (Signed)
Review of pertinent gastrointestinal problems: 1. Cirrhosis   Labs 06/2010: Tbili 3, platelets 120s, INR around 1.5. Albumin 1.9, AST 80s, ALT normal  Most recent imaging: [place] 06/2010 showed cirrhosis, no discrete masses.    Duplex US 3/12 showed portal vein thrombus with NO flow in main portal vein  HPI: This is a very pleasant 55 year old man whom I last saw about 5 weeks ago when he was hospitalized at [facility] for alcohol related liver troubles.  He clearly had alcoholic hepatitis, likely underlying cirrhosis based on imaging. He was discharged on low-dose Aldactone and recommended to stop drinking completely. He was found to have a portal vein thrombus while hospitalized.  He has been taking aldactone once daily 50mg .   He is still drinking  Beer (12 oz) every day.  Denies liquor use.    No fevers, chills, no overt GI bleeding. Drinking a lot of water lately.  No abd pains.  No new jaundice.     Physical Exam: BP 120/66  Pulse 104  Ht 5\' 7"  (1.702 m)  Wt 151 lb 6.4 oz (68.675 kg)  BMI 23.71 kg/m2 Constitutional: generally well-appearing Psychiatric: alert and oriented x3 Abdomen: soft, nontender, nondistended, no obvious ascites, no peritoneal signs, normal bowel sounds 1+ lower extremity edema in ankles bilaterally    Assessment and plan: 55 y.o. male with cirrhosis, alcoholic  I told him in very clear terms that he needs to completely stop drinking and never restarted. He should also stop smoking. He is a bit fluid overloaded based on ankle edema and I will add low dose Lasix to his regimen. He'll get a repeat set of electrolytes and 2 weeks. He'll return to see me in one month. We will get a laboratory workup to check for other causes of chronic liver disease, see that list below.

## 2010-09-04 LAB — HEPATITIS C ANTIBODY: HCV Ab: NEGATIVE

## 2010-09-04 LAB — IGM: IgM, Serum: 140 mg/dL (ref 60–263)

## 2010-09-04 LAB — HEPATITIS B SURFACE ANTIGEN: Hepatitis B Surface Ag: NEGATIVE

## 2010-09-04 LAB — MITOCHONDRIAL ANTIBODIES: Mitochondrial M2 Ab, IgG: 0.81 (ref <0.91)

## 2010-09-04 LAB — ANA: Anti Nuclear Antibody(ANA): NEGATIVE

## 2010-09-04 LAB — IGG: IgG (Immunoglobin G), Serum: 2890 mg/dL — ABNORMAL HIGH (ref 700–1600)

## 2010-09-04 LAB — ANTI-SMOOTH MUSCLE ANTIBODY, IGG: Smooth Muscle Ab: 22 U — ABNORMAL HIGH (ref <20)

## 2010-09-04 LAB — HEPATITIS B SURFACE ANTIBODY,QUALITATIVE: Hep B S Ab: NEGATIVE

## 2010-09-13 NOTE — Op Note (Signed)
NAME:  Mark [name], Mark [name]               ACCOUNT NO.:  [id number]   MEDICAL RECORD NO.:  [id number]          PATIENT TYPE:  AMB   LOCATION:  [facility]                          FACILITY:  [facility]   PHYSICIAN:  Feliberto Gottron. Turner Daniels, M.D.   DATE OF BIRTH:  June 09, 1955   DATE OF PROCEDURE:  05/06/2004  DATE OF DISCHARGE:  05/06/2004                                 OPERATIVE REPORT   PREOPERATIVE DIAGNOSIS:  Left knee medial meniscal tear.   POSTOPERATIVE DIAGNOSIS:  Left knee medial meniscal tear.   PROCEDURE:  Left knee partial arthroscopic medial meniscectomy.   ANESTHETIC:  General LMA.   ESTIMATED BLOOD LOSS:  Minimal.   FLUID REPLACEMENT:  800 cc crystalloid.   TOURNIQUET TIME:  None.   COMPLICATIONS:  None.   SURGEON:  Feliberto Gottron. Turner Daniels, M.D.   INDICATIONS FOR PROCEDURE:  55 year old man with symptomatic medial meniscal  tear of his left knee.  He has failed conservative treatment and desires  arthroscopic evaluation and treatment of same.   DESCRIPTION OF PROCEDURE:  The patient identified by armband, taken the  operating room, at [facility]. Appropriate anesthetic  monitors were attached and general LMA anesthesia induced with the patient  in supine position. Lateral post applied to the table.  The left lower  extremity prepped, draped in sterile fashion from the ankle to the midthigh.  Using a #11 blade standard inferomedial, inferolateral peripatellar portals  were then made allowing introduction of  the arthroscope through the  inferolateral portal and the outflow through the inferomedial portal.  Diagnostic arthroscopy revealed minimal chondromalacia to the patellofemoral  compartment. Moving into the medial compartment, we identified a complex  parrot beak tear of the medial meniscus which was removed with a straight  biter and 3.5 gator sucker shaver. The ACL and PCL were intact. Lateral  compartment had some mild chondromalacia and the lateral meniscus was noted  to  be intact. The gutters were cleared medial laterally. The scope was taken  medial lateral to the PCL clearing the posterior compartments.  The knee was then irrigated out with normal saline solution using the pump  and the arthroscopic instruments removed. A dressing of Xeroform 4x4  dressing sponges, Webril and Ace wrap applied.  The patient was then  awakened and taken to the recovery room without difficulty.      FJR/MEDQ  D:  05/27/2004  T:  05/27/2004  Job:  [id number]

## 2010-10-25 ENCOUNTER — Other Ambulatory Visit: Payer: Self-pay | Admitting: Gastroenterology

## 2010-10-25 MED ORDER — SPIRONOLACTONE 50 MG PO TABS: 50.0000 mg | ORAL_TABLET | Freq: Every day | ORAL | Status: DC

## 2010-10-25 NOTE — Telephone Encounter (Signed)
Spironolactone has been sent to the pharmacy and his f/u appt has been made for 11/05/10 at 315 pm

## 2010-11-05 ENCOUNTER — Telehealth: Payer: Self-pay | Admitting: Gastroenterology

## 2010-11-05 ENCOUNTER — Ambulatory Visit: Payer: BC Managed Care – PPO | Admitting: Gastroenterology

## 2010-11-05 NOTE — Telephone Encounter (Signed)
Pt admits he has been drinking and did not want to keep his appt until he gets help he has scheduled alcohol cessation meetings.  He did schedule a f/u with Dr Christella Hartigan but states he did not want to waste Dr Christella Hartigan time or his money until he was sober.

## 2010-11-06 NOTE — Telephone Encounter (Signed)
ok 

## 2010-12-05 ENCOUNTER — Ambulatory Visit (INDEPENDENT_AMBULATORY_CARE_PROVIDER_SITE_OTHER): Payer: BC Managed Care – PPO | Admitting: General Surgery

## 2010-12-16 ENCOUNTER — Encounter (INDEPENDENT_AMBULATORY_CARE_PROVIDER_SITE_OTHER): Payer: Self-pay | Admitting: Surgery

## 2010-12-18 ENCOUNTER — Encounter (INDEPENDENT_AMBULATORY_CARE_PROVIDER_SITE_OTHER): Payer: Self-pay | Admitting: Surgery

## 2010-12-20 ENCOUNTER — Encounter: Payer: Self-pay | Admitting: Gastroenterology

## 2010-12-20 ENCOUNTER — Other Ambulatory Visit (INDEPENDENT_AMBULATORY_CARE_PROVIDER_SITE_OTHER): Payer: BC Managed Care – PPO

## 2010-12-20 ENCOUNTER — Ambulatory Visit (INDEPENDENT_AMBULATORY_CARE_PROVIDER_SITE_OTHER): Payer: BC Managed Care – PPO | Admitting: Gastroenterology

## 2010-12-20 VITALS — BP 116/72 | HR 72 | Ht 67.0 in | Wt 146.0 lb

## 2010-12-20 DIAGNOSIS — K7030 Alcoholic cirrhosis of liver without ascites: Secondary | ICD-10-CM | POA: Insufficient documentation

## 2010-12-20 LAB — COMPREHENSIVE METABOLIC PANEL
ALT: 20 U/L (ref 0–53)
AST: 53 U/L — ABNORMAL HIGH (ref 0–37)
Albumin: 2.4 g/dL — ABNORMAL LOW (ref 3.5–5.2)
Alkaline Phosphatase: 139 U/L — ABNORMAL HIGH (ref 39–117)
BUN: 8 mg/dL (ref 6–23)
CO2: 25 mEq/L (ref 19–32)
Calcium: 7.9 mg/dL — ABNORMAL LOW (ref 8.4–10.5)
Chloride: 107 mEq/L (ref 96–112)
Creatinine, Ser: 0.7 mg/dL (ref 0.4–1.5)
GFR: 157.06 mL/min (ref >60.00)
Glucose, Bld: 96 mg/dL (ref 70–99)
Potassium: 4.3 mEq/L (ref 3.5–5.1)
Sodium: 138 mEq/L (ref 135–145)
Total Bilirubin: 2.5 mg/dL — ABNORMAL HIGH (ref 0.3–1.2)
Total Protein: 7 g/dL (ref 6.0–8.3)

## 2010-12-20 LAB — CBC WITH DIFFERENTIAL/PLATELET
Basophils Absolute: 0 10*3/uL (ref 0.0–0.1)
Basophils Relative: 0.5 % (ref 0.0–3.0)
Eosinophils Absolute: 0.2 10*3/uL (ref 0.0–0.7)
Eosinophils Relative: 5.3 % — ABNORMAL HIGH (ref 0.0–5.0)
HCT: 30.4 % — ABNORMAL LOW (ref 39.0–52.0)
Hemoglobin: 10.3 g/dL — ABNORMAL LOW (ref 13.0–17.0)
Lymphocytes Relative: 25.9 % (ref 12.0–46.0)
Lymphs Abs: 1.1 10*3/uL (ref 0.7–4.0)
MCHC: 33.9 g/dL (ref 30.0–36.0)
MCV: 103 fl — ABNORMAL HIGH (ref 78.0–100.0)
Monocytes Absolute: 0.4 10*3/uL (ref 0.1–1.0)
Monocytes Relative: 9.8 % (ref 3.0–12.0)
Neutro Abs: 2.5 10*3/uL (ref 1.4–7.7)
Neutrophils Relative %: 58.5 % (ref 43.0–77.0)
Platelets: 121 10*3/uL — ABNORMAL LOW (ref 150.0–400.0)
RBC: 2.95 Mil/uL — ABNORMAL LOW (ref 4.22–5.81)
RDW: 17.1 % — ABNORMAL HIGH (ref 11.5–14.6)
WBC: 4.3 10*3/uL — ABNORMAL LOW (ref 4.5–10.5)

## 2010-12-20 LAB — PROTIME-INR
INR: 1.4 ratio — ABNORMAL HIGH (ref 0.8–1.0)
Prothrombin Time: 14.9 s — ABNORMAL HIGH (ref 10.2–12.4)

## 2010-12-20 NOTE — Progress Notes (Signed)
Review of pertinent gastrointestinal problems:  1. Cirrhosis likely from alcoholism.: Blood work May 2012 showed hepatitis B. negative hepatitis C negative, ANA negative, AMA negative, anti-smooth muscle antibody weakly positive, iron studies do not indicate iron overload. Labs 06/2010: Tbili 3, platelets 120s, INR around 1.5. Albumin 1.9, AST 80s, ALT normal Most recent imaging: [place] 06/2010 showed cirrhosis, no discrete masses.  Duplex US 3/12 showed portal vein thrombus with NO flow in main portal vein Most recent alpha-fetoprotein in May 2012 was normal   HPI: This is a very pleasant 55 year old man whom I last saw about 3 months ago.  he has lost 5 pounds since his last visit 3 months ago.  He drinks one beer every other day.  Smokes 2-3 cigs every other day.  We added Lasix to his medicine regimen 3 months ago and it seems like it is helping. He stopped taking lactulose.    Past Medical History:   Cirrhosis                                                    Alcohol abuse                                                Anemia                                                       Thrombocytopenia                                             Ascites                                                      Hypertension                                                 Hyperlipidemia                                              Past Surgical History:   KNEE ARTHROSCOPY                                             reports that he has been smoking Cigarettes.  He has never used smokeless tobacco. He reports that he drinks alcohol. He reports that he uses illicit drugs (Marijuana).  family history includes Dementia in his mother; Diabetes  in his father; Hypertension in his father; and Lupus in his sister.    Current medicines and allergies were reviewed in [place] [facility]    Physical Exam: BP 116/72  Pulse 72  Ht 5\' 7"  (1.702 m)  Wt 146 lb (66.225 kg)  BMI 22.87 kg/m2 Constitutional:  generally well-appearing Psychiatric: alert and oriented x3 Abdomen: soft, nontender, nondistended, no obvious ascites, no peritoneal signs, normal bowel sounds 1+ pitting edema I laterally    Assessment and plan: 55 y.o. male with alcoholic cirrhosis  He is dramatically cutting back on his drinking and smoking. I commended him on this. He does have a bit of edema still in his legs but it is not bothering him. He is going to get a basic set of labs today including CBC, complete metabolic profile, coags to see how his liver is functioning again. He has not had upper endoscopy to screen him for varices and we will set that up as well. At that point I will schedule him for a repeat office visit.

## 2010-12-20 NOTE — Patient Instructions (Addendum)
One of your biggest health concerns is your smoking.  This increases your risk for most cancers and serious cardiovascular diseases such as strokes, heart attacks.  You should try your best to stop.  If you need assistance, please contact your PCP or Smoking Cessation Class at [facility] [phone]) or [facility] [facility] Quit-Line (1-800-QUIT-NOW). You will be set up for an upper endoscopy at [facility] with propofol for variceal screening. You will have labs checked today in the basement lab.  Please head down after you check out with the front desk  (cbc, cmet, inr, total Hepatitis IgG) It is important that you have a relatively low salt diet.  High salt diet can cause fluid to accumulate in your legs, abdomen and even around your lungs. You should try to avoid NSAID type over the counter pain medicines as best as possible. Tylenol is safe to take for 'routine' aches and pains, but never take more than 1/2 the dose suggested on the package instructions (never more than 2 grams per day). Avoid alcohol.

## 2010-12-21 LAB — IGG: IgG (Immunoglobin G), Serum: 2860 mg/dL — ABNORMAL HIGH (ref 650–1600)

## 2010-12-24 ENCOUNTER — Encounter (INDEPENDENT_AMBULATORY_CARE_PROVIDER_SITE_OTHER): Payer: Self-pay | Admitting: Surgery

## 2010-12-26 ENCOUNTER — Ambulatory Visit (INDEPENDENT_AMBULATORY_CARE_PROVIDER_SITE_OTHER): Payer: Worker's Compensation | Admitting: Surgery

## 2010-12-26 ENCOUNTER — Encounter (INDEPENDENT_AMBULATORY_CARE_PROVIDER_SITE_OTHER): Payer: Self-pay | Admitting: Surgery

## 2010-12-26 VITALS — BP 110/68 | HR 86 | Temp 98.4°F | Ht 67.0 in | Wt 146.5 lb

## 2010-12-26 DIAGNOSIS — K4090 Unilateral inguinal hernia, without obstruction or gangrene, not specified as recurrent: Secondary | ICD-10-CM

## 2010-12-26 NOTE — Progress Notes (Signed)
Chief Complaint  Patient presents with  . Inguinal Hernia    right    HPI Mark [name] is a 55 y.o. male.   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 He was lifting a heavy load at work when he felt a pulling sensation in his right groin. This happened about 3 weeks ago. He developed a bulge in this area. He was seen at occupational medicine who diagnosed him with a right inguinal hernia. He was now referred for surgical evaluation.  In scanning his hospital notes the patient was hospitalized in July after a TIA. He is also noted to have tobacco and alcohol abuse with signs of cirrhosis. He is followed by Dr. Wendall Papa. He continues to drink alcohol daily. He smokes 5 cigarettes a day. Past Medical History  Diagnosis Date  . Cirrhosis   . Alcohol abuse   . Anemia   . Thrombocytopenia   . Ascites   . Hypertension   . Hyperlipidemia   . Heart murmur   . Myocardial infarction   . Stroke   . Leg swelling     Past Surgical History  Procedure Date  . Knee arthroscopy     Family History  Problem Relation Age of Onset  . Hypertension Father   . Diabetes Father   . Dementia Mother   . Lupus Sister     Social History History  Substance Use Topics  . Smoking status: Current Everyday Smoker -- 0.2 packs/day    Types: Cigarettes  . Smokeless tobacco: Current User   Comment: 4 cigarettes a day  . Alcohol Use: Yes     beer    No Known Allergies  Current Outpatient Prescriptions  Medication Sig Dispense Refill  . aspirin 81 MG tablet Take 81 mg by mouth daily.        . furosemide (LASIX) 20 MG tablet Take 1 tablet (20 mg total) by mouth daily.  30 tablet  11  . IRON PO Take 65 mg PE by mouth daily.        Marland Kitchen spironolactone (ALDACTONE) 50 MG tablet Take 1 tablet (50 mg total) by mouth daily.  30 tablet  6  . lactulose (CHRONULAC) 10 GM/15ML solution Take by mouth. 13 ml twice daily          Review of Systems Review of Systems Positive for leg swelling Blood  pressure 110/68, pulse 86, temperature 98.4 F (36.9 C), height 5\' 7"  (1.702 m), weight 146 lb 8 oz (66.452 kg).  Physical Exam Physical Exam WDWN in NAD HEENT:  EOMI, sclera moderately icteric Neck:  No masses, no thyromegaly Lungs:  CTA bilaterally; normal respiratory effort CV:  Regular rate and rhythm; no murmurs Abd:  +bowel sounds, soft, non-tender, no masses GU:  Visible right inguinal bulge; reducible;  No sign of left inguinal hernia Ext:  Well-perfused; no edema Skin:  Warm, dry; no sign of jaundice  Data Reviewed None  Assessment    Right inguinal hernia Hepatic cirrhosis with continued alcohol abuse    Plan    Recommend right inguinal hernia repair with mesh.  The surgical procedure has been discussed with the patient.  Potential risks, benefits, alternative treatments, and expected outcomes have been explained.  All of the patient's questions at this time have been answered.  The patient understand the proposed surgical procedure and wishes to proceed.  I also spent some time counseling the patient on the importance of alcohol cessation.  His cirrhosis makes this procedure  higher risk.  He states that he will try to quit drinking.       Teja Costen K. 12/26/2010, 11:16 AM

## 2011-01-02 ENCOUNTER — Encounter: Payer: BC Managed Care – PPO | Admitting: Gastroenterology

## 2011-01-02 ENCOUNTER — Telehealth: Payer: Self-pay

## 2011-01-02 ENCOUNTER — Ambulatory Visit (HOSPITAL_COMMUNITY)
Admission: RE | Admit: 2011-01-02 | Discharge: 2011-01-02 | Disposition: A | Discharge status: Home / Self Care | Payer: BC Managed Care – PPO | Source: Ambulatory Visit | Attending: Gastroenterology | Admitting: Gastroenterology

## 2011-01-02 ENCOUNTER — Telehealth: Payer: Self-pay | Admitting: Gastroenterology

## 2011-01-02 DIAGNOSIS — D649 Anemia, unspecified: Secondary | ICD-10-CM | POA: Insufficient documentation

## 2011-01-02 DIAGNOSIS — K319 Disease of stomach and duodenum, unspecified: Secondary | ICD-10-CM | POA: Insufficient documentation

## 2011-01-02 DIAGNOSIS — Z79899 Other long term (current) drug therapy: Secondary | ICD-10-CM | POA: Insufficient documentation

## 2011-01-02 DIAGNOSIS — D696 Thrombocytopenia, unspecified: Secondary | ICD-10-CM | POA: Insufficient documentation

## 2011-01-02 DIAGNOSIS — B1910 Unspecified viral hepatitis B without hepatic coma: Secondary | ICD-10-CM | POA: Insufficient documentation

## 2011-01-02 DIAGNOSIS — Z8673 Personal history of transient ischemic attack (TIA), and cerebral infarction without residual deficits: Secondary | ICD-10-CM | POA: Insufficient documentation

## 2011-01-02 DIAGNOSIS — Z7982 Long term (current) use of aspirin: Secondary | ICD-10-CM | POA: Insufficient documentation

## 2011-01-02 DIAGNOSIS — K766 Portal hypertension: Secondary | ICD-10-CM | POA: Insufficient documentation

## 2011-01-02 DIAGNOSIS — K7460 Unspecified cirrhosis of liver: Secondary | ICD-10-CM | POA: Insufficient documentation

## 2011-01-02 DIAGNOSIS — I8500 Esophageal varices without bleeding: Secondary | ICD-10-CM

## 2011-01-02 DIAGNOSIS — I10 Essential (primary) hypertension: Secondary | ICD-10-CM | POA: Insufficient documentation

## 2011-01-02 DIAGNOSIS — K7030 Alcoholic cirrhosis of liver without ascites: Secondary | ICD-10-CM

## 2011-01-02 MED ORDER — NADOLOL 20 MG PO TABS: 20.0000 mg | ORAL_TABLET | Freq: Every day | ORAL | Status: DC

## 2011-01-02 NOTE — Telephone Encounter (Signed)
Pt will be called on Monday to schedule appt, he had procedure today and unavailable to schedule.

## 2011-01-02 NOTE — Telephone Encounter (Signed)
asdf

## 2011-01-02 NOTE — Telephone Encounter (Signed)
Message copied by Donata Duff on Thu Jan 02, 2011  3:28 PM ------      Message from: Rob Bunting P      Created: Thu Jan 02, 2011 11:09 AM       He needs BP and HR check in 10 days, also needs rov with me in 6-8 weeks.              thanks

## 2011-01-06 NOTE — Telephone Encounter (Signed)
Pt scheduled for BP HR and recall for 6-8 week f/u schedule not out yet for that time pt aware

## 2011-01-17 ENCOUNTER — Ambulatory Visit: Payer: Self-pay | Admitting: Gastroenterology

## 2011-01-17 VITALS — BP 100/52 | HR 60

## 2011-01-17 DIAGNOSIS — K7030 Alcoholic cirrhosis of liver without ascites: Secondary | ICD-10-CM

## 2011-02-07 ENCOUNTER — Other Ambulatory Visit (INDEPENDENT_AMBULATORY_CARE_PROVIDER_SITE_OTHER): Payer: Self-pay | Admitting: Surgery

## 2011-02-07 ENCOUNTER — Encounter (HOSPITAL_COMMUNITY): Payer: Worker's Compensation

## 2011-02-07 LAB — CBC
HCT: 29.5 % — ABNORMAL LOW (ref 39.0–52.0)
Hemoglobin: 10.2 g/dL — ABNORMAL LOW (ref 13.0–17.0)
MCH: 34.2 pg — ABNORMAL HIGH (ref 26.0–34.0)
MCHC: 34.6 g/dL (ref 30.0–36.0)
MCV: 99 fL (ref 78.0–100.0)
Platelets: 132 10*3/uL — ABNORMAL LOW (ref 150–400)
RBC: 2.98 MIL/uL — ABNORMAL LOW (ref 4.22–5.81)
RDW: 16.5 % — ABNORMAL HIGH (ref 11.5–15.5)
WBC: 5.1 10*3/uL (ref 4.0–10.5)

## 2011-02-07 LAB — DIFFERENTIAL
Basophils Absolute: 0 10*3/uL (ref 0.0–0.1)
Basophils Relative: 1 % (ref 0–1)
Eosinophils Absolute: 0.3 10*3/uL (ref 0.0–0.7)
Eosinophils Relative: 7 % — ABNORMAL HIGH (ref 0–5)
Lymphocytes Relative: 26 % (ref 12–46)
Lymphs Abs: 1.3 10*3/uL (ref 0.7–4.0)
Monocytes Absolute: 0.4 10*3/uL (ref 0.1–1.0)
Monocytes Relative: 8 % (ref 3–12)
Neutro Abs: 3 10*3/uL (ref 1.7–7.7)
Neutrophils Relative %: 58 % (ref 43–77)

## 2011-02-07 LAB — SURGICAL PCR SCREEN
MRSA, PCR: NEGATIVE
Staphylococcus aureus: NEGATIVE

## 2011-02-07 LAB — COMPREHENSIVE METABOLIC PANEL
ALT: 15 U/L (ref 0–53)
AST: 47 U/L — ABNORMAL HIGH (ref 0–37)
Albumin: 2.1 g/dL — ABNORMAL LOW (ref 3.5–5.2)
Alkaline Phosphatase: 181 U/L — ABNORMAL HIGH (ref 39–117)
BUN: 6 mg/dL (ref 6–23)
CO2: 24 mEq/L (ref 19–32)
Calcium: 8 mg/dL — ABNORMAL LOW (ref 8.4–10.5)
Chloride: 106 mEq/L (ref 96–112)
Creatinine, Ser: 0.66 mg/dL (ref 0.50–1.35)
GFR calc Af Amer: >90 mL/min (ref >90)
GFR calc non Af Amer: >90 mL/min (ref >90)
Glucose, Bld: 114 mg/dL — ABNORMAL HIGH (ref 70–99)
Potassium: 4 mEq/L (ref 3.5–5.1)
Sodium: 136 mEq/L (ref 135–145)
Total Bilirubin: 1.7 mg/dL — ABNORMAL HIGH (ref 0.3–1.2)
Total Protein: 7.4 g/dL (ref 6.0–8.3)

## 2011-02-07 LAB — PROTIME-INR
INR: 1.74 — ABNORMAL HIGH (ref 0.00–1.49)
Prothrombin Time: 20.7 seconds — ABNORMAL HIGH (ref 11.6–15.2)

## 2011-02-07 NOTE — Progress Notes (Signed)
Quick Note:  This patient may proceed with surgery. He needs to have a repeat PT/ INR the morning of surgery. ______

## 2011-02-11 ENCOUNTER — Ambulatory Visit (HOSPITAL_COMMUNITY)
Admission: RE | Admit: 2011-02-11 | Discharge: 2011-02-11 | Disposition: A | Discharge status: Home / Self Care | Payer: Worker's Compensation | Source: Ambulatory Visit | Attending: Surgery | Admitting: Surgery

## 2011-02-11 ENCOUNTER — Other Ambulatory Visit (INDEPENDENT_AMBULATORY_CARE_PROVIDER_SITE_OTHER): Payer: Self-pay | Admitting: Surgery

## 2011-02-11 ENCOUNTER — Telehealth (INDEPENDENT_AMBULATORY_CARE_PROVIDER_SITE_OTHER): Payer: Self-pay | Admitting: General Surgery

## 2011-02-11 DIAGNOSIS — F17200 Nicotine dependence, unspecified, uncomplicated: Secondary | ICD-10-CM | POA: Insufficient documentation

## 2011-02-11 DIAGNOSIS — K7030 Alcoholic cirrhosis of liver without ascites: Secondary | ICD-10-CM

## 2011-02-11 DIAGNOSIS — K7010 Alcoholic hepatitis without ascites: Secondary | ICD-10-CM | POA: Insufficient documentation

## 2011-02-11 DIAGNOSIS — Z8673 Personal history of transient ischemic attack (TIA), and cerebral infarction without residual deficits: Secondary | ICD-10-CM | POA: Insufficient documentation

## 2011-02-11 DIAGNOSIS — Z01812 Encounter for preprocedural laboratory examination: Secondary | ICD-10-CM | POA: Insufficient documentation

## 2011-02-11 DIAGNOSIS — Z01810 Encounter for preprocedural cardiovascular examination: Secondary | ICD-10-CM | POA: Insufficient documentation

## 2011-02-11 DIAGNOSIS — F1110 Opioid abuse, uncomplicated: Secondary | ICD-10-CM | POA: Insufficient documentation

## 2011-02-11 DIAGNOSIS — I10 Essential (primary) hypertension: Secondary | ICD-10-CM | POA: Insufficient documentation

## 2011-02-11 DIAGNOSIS — Z5309 Procedure and treatment not carried out because of other contraindication: Secondary | ICD-10-CM | POA: Insufficient documentation

## 2011-02-11 DIAGNOSIS — F1020 Alcohol dependence, uncomplicated: Secondary | ICD-10-CM | POA: Insufficient documentation

## 2011-02-11 DIAGNOSIS — K4090 Unilateral inguinal hernia, without obstruction or gangrene, not specified as recurrent: Secondary | ICD-10-CM | POA: Insufficient documentation

## 2011-02-11 LAB — PROTIME-INR
INR: 1.69 — ABNORMAL HIGH (ref 0.00–1.49)
Prothrombin Time: 20.2 seconds — ABNORMAL HIGH (ref 11.6–15.2)

## 2011-02-11 NOTE — Telephone Encounter (Signed)
Called Dr Rob Bunting office and pt has a appt with Dr Christella Hartigan on 03/04/11 and pt is aware of the appt with Dr Christella Hartigan and also aware that I will be giving him a call for a appt to come back in to see Dr Corliss Skains after the appt with Dr Christella Hartigan. I also need a note stating the Alcoholic Cirrhosis so I can fax to Dr Christella Hartigan office

## 2011-03-04 ENCOUNTER — Ambulatory Visit: Payer: Worker's Compensation | Admitting: Gastroenterology

## 2011-05-01 ENCOUNTER — Telehealth (INDEPENDENT_AMBULATORY_CARE_PROVIDER_SITE_OTHER): Payer: Self-pay | Admitting: General Surgery

## 2011-05-01 NOTE — Telephone Encounter (Signed)
Called Mr Tracz today to see if he a Doctor in [place] for his Hernia repair due to his workman comp

## 2011-05-13 ENCOUNTER — Other Ambulatory Visit: Payer: Self-pay | Admitting: Gastroenterology

## 2011-05-13 NOTE — Telephone Encounter (Signed)
Pt needs office visit Left message on machine to call back 1 refill can be sent to last until ROV

## 2011-05-14 MED ORDER — SPIRONOLACTONE 50 MG PO TABS: 50.0000 mg | ORAL_TABLET | Freq: Every day | ORAL | Status: DC

## 2011-05-14 NOTE — Telephone Encounter (Signed)
Pt aware that refill was sent , he was also notified that he needs an office visit before further refill can be given.  He scheduled a follow up for 05/23/11.

## 2011-05-23 ENCOUNTER — Encounter: Payer: Self-pay | Admitting: Gastroenterology

## 2011-05-23 ENCOUNTER — Other Ambulatory Visit (INDEPENDENT_AMBULATORY_CARE_PROVIDER_SITE_OTHER): Payer: BC Managed Care – PPO

## 2011-05-23 ENCOUNTER — Ambulatory Visit (INDEPENDENT_AMBULATORY_CARE_PROVIDER_SITE_OTHER): Payer: BC Managed Care – PPO | Admitting: Gastroenterology

## 2011-05-23 VITALS — BP 100/60 | HR 72 | Ht 67.5 in | Wt 163.0 lb

## 2011-05-23 DIAGNOSIS — K7460 Unspecified cirrhosis of liver: Secondary | ICD-10-CM

## 2011-05-23 LAB — COMPREHENSIVE METABOLIC PANEL
ALT: 21 U/L (ref 0–53)
AST: 46 U/L — ABNORMAL HIGH (ref 0–37)
Albumin: 2.2 g/dL — ABNORMAL LOW (ref 3.5–5.2)
Alkaline Phosphatase: 150 U/L — ABNORMAL HIGH (ref 39–117)
BUN: 7 mg/dL (ref 6–23)
CO2: 22 mEq/L (ref 19–32)
Calcium: 7.7 mg/dL — ABNORMAL LOW (ref 8.4–10.5)
Chloride: 110 mEq/L (ref 96–112)
Creatinine, Ser: 0.9 mg/dL (ref 0.4–1.5)
GFR: 114.49 mL/min (ref >60.00)
Glucose, Bld: 100 mg/dL — ABNORMAL HIGH (ref 70–99)
Potassium: 3.8 mEq/L (ref 3.5–5.1)
Sodium: 137 mEq/L (ref 135–145)
Total Bilirubin: 2.1 mg/dL — ABNORMAL HIGH (ref 0.3–1.2)
Total Protein: 6.4 g/dL (ref 6.0–8.3)

## 2011-05-23 LAB — CBC WITH DIFFERENTIAL/PLATELET
Basophils Absolute: 0 10*3/uL (ref 0.0–0.1)
Basophils Relative: 0.9 % (ref 0.0–3.0)
Eosinophils Absolute: 0.4 10*3/uL (ref 0.0–0.7)
Eosinophils Relative: 8.2 % — ABNORMAL HIGH (ref 0.0–5.0)
HCT: 29.2 % — ABNORMAL LOW (ref 39.0–52.0)
Hemoglobin: 10 g/dL — ABNORMAL LOW (ref 13.0–17.0)
Lymphocytes Relative: 30.1 % (ref 12.0–46.0)
Lymphs Abs: 1.3 10*3/uL (ref 0.7–4.0)
MCHC: 34.3 g/dL (ref 30.0–36.0)
MCV: 98.6 fl (ref 78.0–100.0)
Monocytes Absolute: 0.5 10*3/uL (ref 0.1–1.0)
Monocytes Relative: 11.3 % (ref 3.0–12.0)
Neutro Abs: 2.2 10*3/uL (ref 1.4–7.7)
Neutrophils Relative %: 49.5 % (ref 43.0–77.0)
Platelets: 128 10*3/uL — ABNORMAL LOW (ref 150.0–400.0)
RBC: 2.96 Mil/uL — ABNORMAL LOW (ref 4.22–5.81)
RDW: 17.8 % — ABNORMAL HIGH (ref 11.5–14.6)
WBC: 4.4 10*3/uL — ABNORMAL LOW (ref 4.5–10.5)

## 2011-05-23 LAB — PROTIME-INR
INR: 1.6 ratio — ABNORMAL HIGH (ref 0.8–1.0)
Prothrombin Time: 17.7 s — ABNORMAL HIGH (ref 10.2–12.4)

## 2011-05-23 NOTE — Patient Instructions (Addendum)
One of your biggest health concerns is your smoking.  This increases your risk for most cancers and serious cardiovascular diseases such as strokes, heart attacks.  You should try your best to stop.  If you need assistance, please contact your PCP or Smoking Cessation Class at [facility] [phone]) or [facility] Quit-Line (1-800-QUIT-NOW). You will have labs checked today in the basement lab.  Please head down after you check out with the front desk  (cbc, cmet, inr, AFP, hepatitis A IgG, hepatitis A IgM). It is important that you have a relatively low salt diet.  High salt diet can cause fluid to accumulate in your legs, abdomen and even around your lungs. You should try to avoid NSAID type over the counter pain medicines as best as possible. Tylenol is safe to take for 'routine' aches and pains, but never take more than 1/2 the dose suggested on the package instructions (never more than 2 grams per day). Avoid alcohol. RUQ ultrasound, screening for hepatoma.  [facility] Please arrive 05/26/11 815 am for a 830 am appointment.  Have nothing to eat or drink after midnight. Return to see Dr. Christella Hartigan in 6 months.

## 2011-05-23 NOTE — Progress Notes (Signed)
Review of pertinent gastrointestinal problems: 1. Cirrhosis (likely related to alcohol; blood work May 2012 show hepatitis C negative, hepatitis B surface antigen negative, hepatitis B surface antibody negative, iron levels low, ANA negative, AMA negative, anti-smooth muscle antibody weakly positive) Labs 06/2010: Tbili 3, platelets 120s, INR around 1.5. Albumin 1.9, AST 80s, ALT normal EGD, September 2012 found small to medium esophageal varices, portal gastropathy, no gastric varices. Nadolol was started. Most recent imaging: [place] 06/2010 showed cirrhosis, no discrete masses.  Most recent alpha-fetoprotein: 4.1, normal Duplex US 3/12 showed portal vein thrombus with NO flow in main portal vein  HPI: This is a  very pleasant 56 year old man whom I last saw 6 or 8 months ago. He has been really feeling fine.   Drinks one beer a week.  No overt gi bleeding.  Was going to have inguinal hernia repair last fall but it fell through.  Scheduled for it now at [facility] next week.   Past Medical History  Diagnosis Date  . Cirrhosis   . Alcohol abuse   . Anemia   . Thrombocytopenia   . Ascites   . Hypertension   . Hyperlipidemia   . Heart murmur   . Myocardial infarction   . Stroke   . Leg swelling     Past Surgical History  Procedure Date  . Knee arthroscopy     bilateral    Current Outpatient Prescriptions  Medication Sig Dispense Refill  . aspirin 81 MG tablet Take 81 mg by mouth daily.        . furosemide (LASIX) 20 MG tablet Take 1 tablet (20 mg total) by mouth daily.  30 tablet  11  . IRON PO Take 65 mg PE by mouth daily.        . nadolol (CORGARD) 20 MG tablet Take 1 tablet (20 mg total) by mouth daily.  30 tablet  11  . spironolactone (ALDACTONE) 50 MG tablet Take 1 tablet (50 mg total) by mouth daily.  30 tablet  2    Allergies as of 05/23/2011  . (No Known Allergies)    Family History  Problem Relation Age of Onset  . Hypertension Father   . Diabetes Father   . Dementia  Mother   . Lupus Sister     History   Social History  . Marital Status: Married    Spouse Name: N/A    Number of Children: 4  . 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 Smoking status: Current Everyday Smoker -- 0.2 packs/day    Types: Cigarettes  . Smokeless tobacco: Never Used   Comment: 4 cigarettes a day  . Alcohol Use: Yes     rarely  . Drug Use: No  . Sexually Active: Not on file     now and then smokies marijuana   Other Topics Concern  . Not on file   Social History Narrative  . No narrative on file      Physical Exam: BP 100/60  Pulse 72  Ht 5' 7.5" (1.715 m)  Wt 163 lb (73.936 kg)  BMI 25.15 kg/m2 Constitutional: generally well-appearing Psychiatric: alert and oriented x3 Abdomen: soft, nontender, nondistended, no obvious ascites, no peritoneal signs, normal bowel sounds Trace to 1+ edema in extremities    Assessment and plan: 56 y.o. male with alcohol related cirrhosis, well compensated  He will get some labs drawn today, also need  his annual ultrasound to screen for hepatoma. I gave him the usual recommendations for people with cirrhosis. He will return to see me in 6 months and sooner if needed. I commended him on dramatically cutting back on his alcohol drinking. He is on nadolol and the dose seems good based on his blood pressure and pulse.

## 2011-05-24 LAB — AFP TUMOR MARKER: AFP-Tumor Marker: 4.8 ng/mL (ref 0.0–8.0)

## 2011-05-26 ENCOUNTER — Other Ambulatory Visit (HOSPITAL_COMMUNITY): Payer: BC Managed Care – PPO

## 2011-05-26 LAB — HEPATITIS A ANTIBODY, IGM: Hep A IgM: NEGATIVE

## 2011-05-26 LAB — HEPATITIS A ANTIBODY, TOTAL: Hep A Total Ab: NEGATIVE

## 2011-06-06 ENCOUNTER — Ambulatory Visit: Payer: BC Managed Care – PPO | Admitting: Gastroenterology

## 2011-06-06 ENCOUNTER — Ambulatory Visit (HOSPITAL_COMMUNITY)
Admission: RE | Admit: 2011-06-06 | Discharge: 2011-06-06 | Disposition: A | Discharge status: Home / Self Care | Payer: Worker's Compensation | Source: Ambulatory Visit | Attending: Gastroenterology | Admitting: Gastroenterology

## 2011-06-06 ENCOUNTER — Other Ambulatory Visit (INDEPENDENT_AMBULATORY_CARE_PROVIDER_SITE_OTHER): Payer: BC Managed Care – PPO | Admitting: *Deleted

## 2011-06-06 DIAGNOSIS — K7460 Unspecified cirrhosis of liver: Secondary | ICD-10-CM | POA: Insufficient documentation

## 2011-06-06 DIAGNOSIS — Z23 Encounter for immunization: Secondary | ICD-10-CM

## 2011-06-06 DIAGNOSIS — K8020 Calculus of gallbladder without cholecystitis without obstruction: Secondary | ICD-10-CM | POA: Insufficient documentation

## 2011-06-16 ENCOUNTER — Telehealth: Payer: Self-pay

## 2011-06-16 NOTE — Telephone Encounter (Signed)
Left message on machine to call back about missed twin rix

## 2011-06-18 NOTE — Telephone Encounter (Signed)
I spoke with the pt and he will be in tomorrow at 830 for second twin rix, I advised that if he misses the appt we will need to repeat the series from the beginning.  Pt agreed

## 2011-06-19 ENCOUNTER — Ambulatory Visit: Payer: BC Managed Care – PPO | Admitting: Gastroenterology

## 2011-06-19 DIAGNOSIS — K7460 Unspecified cirrhosis of liver: Secondary | ICD-10-CM

## 2011-06-27 ENCOUNTER — Ambulatory Visit (INDEPENDENT_AMBULATORY_CARE_PROVIDER_SITE_OTHER): Payer: BC Managed Care – PPO | Admitting: Gastroenterology

## 2011-06-27 DIAGNOSIS — Z23 Encounter for immunization: Secondary | ICD-10-CM

## 2011-06-27 DIAGNOSIS — K7460 Unspecified cirrhosis of liver: Secondary | ICD-10-CM | POA: Diagnosis not present

## 2011-08-29 ENCOUNTER — Telehealth: Payer: Self-pay | Admitting: Gastroenterology

## 2011-09-01 ENCOUNTER — Telehealth: Payer: Self-pay

## 2011-09-01 NOTE — Telephone Encounter (Signed)
The pt was taken out of work for hernia surgery and wanted a note to say he could go back to work.  I advised the pt that he would need to call his surgeon and they could take care of that for him.  Pt agreed

## 2011-09-09 ENCOUNTER — Encounter: Payer: Self-pay | Admitting: Gastroenterology

## 2011-09-09 ENCOUNTER — Ambulatory Visit (INDEPENDENT_AMBULATORY_CARE_PROVIDER_SITE_OTHER): Payer: BC Managed Care – PPO | Admitting: Gastroenterology

## 2011-09-09 VITALS — BP 104/60 | HR 88 | Ht 67.0 in | Wt 154.5 lb

## 2011-09-09 DIAGNOSIS — K7460 Unspecified cirrhosis of liver: Secondary | ICD-10-CM

## 2011-09-09 NOTE — Progress Notes (Signed)
Review of pertinent gastrointestinal problems:  1. Cirrhosis (likely related to alcohol; blood work May 2012 show hepatitis C negative, hepatitis B surface antigen negative, hepatitis B surface antibody negative, iron levels low, ANA negative, AMA negative, anti-smooth muscle antibody weakly positive)  Labs 05/2011: Tbili 2.1, platelets 120s, INR around 1.6. Albumin 1.9, AST 80s, ALT normal  EGD, September 2012 found small to medium esophageal varices, portal gastropathy, no gastric varices. Nadolol was started.  Most recent imaging: [place] 06/2011 showed cirrhosis, no discrete masses, +gallstones in GB; no ascites Most recent alpha-fetoprotein: 04/2011 was normal Duplex US 3/12 showed portal vein thrombus with NO flow in main portal vein Hepatitis A/B immunization series started 05/2011   HPI: This is a  pleasant 56 year old man. Whom i last saw 3-4 months ago.  He brought in paperwork from work.  Started drinking again "he backslid."  Drinking etoh again.  Also "snorted some heroine."   He told me his wife feels that he is having an affair.  His granddaughter is going to have a surgery.  He wants to clean "his system up."  He even drank alcohol last night.     Past Medical History  Diagnosis Date  . Cirrhosis   . Alcohol abuse   . Anemia   . Thrombocytopenia   . Ascites   . Hypertension   . Hyperlipidemia   . Heart murmur   . Myocardial infarction   . Stroke   . Leg swelling     Past Surgical History  Procedure Date  . Knee arthroscopy     bilateral    Current Outpatient Prescriptions  Medication Sig Dispense Refill  . aspirin 81 MG tablet Take 81 mg by mouth daily.        . IRON PO Take 65 mg PE by mouth daily.        . nadolol (CORGARD) 20 MG tablet Take 1 tablet (20 mg total) by mouth daily.  30 tablet  11  . spironolactone (ALDACTONE) 50 MG tablet Take 1 tablet (50 mg total) by mouth daily.  30 tablet  2  . furosemide (LASIX) 20 MG tablet Take 1 tablet (20 mg total) by mouth  daily.  30 tablet  11    Allergies as of 09/09/2011  . (No Known Allergies)    Family History  Problem Relation Age of Onset  . Hypertension Father   . Diabetes Father   . Dementia Mother   . Lupus Sister     History   Social History  . Marital Status: Married    Spouse Name: N/A    Number of Children: 4  . 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 Smoking status: Current Everyday Smoker -- 0.2 packs/day    Types: Cigarettes  . Smokeless tobacco: Never Used   Comment: 4 cigarettes a day  . Alcohol Use: Yes     rarely  . Drug Use: No  . Sexually Active: Not on file     now and then smokies marijuana   Other Topics Concern  . Not on file   Social History Narrative  . No narrative on file      Physical Exam: Wt 154 lb 8 oz (70.081 kg) Constitutional: generally well-appearing Psychiatric: alert and oriented x3 Abdomen: soft, nontender, nondistended, no obvious ascites, no peritoneal signs, normal bowel sounds     Assessment and plan: 56 y.o. male with  cirrhosis, started drinking again.  Also did heroin  He wants me to write a letter stating he is not drinking, doing drugs but he admits to me that he is.  He wants a drug test, but not for another 2 weeks.  I am happy to order a drug screen for him to be done about 2 weeks from now, those results will be his for him to do whatever he wants. I did explain very clearly shown that I am not addiction specialist I would like to refer him to behavioral therapist to help a bit more. He was not interested in that.

## 2011-09-09 NOTE — Patient Instructions (Addendum)
One of your biggest health concerns is your smoking.  This increases your risk for most cancers and serious cardiovascular diseases such as strokes, heart attacks.  You should try your best to stop.  If you need assistance, please contact your PCP or Smoking Cessation Class at [facility] [phone]) or [facility] Quit-Line (1-800-QUIT-NOW). It is important that you have a relatively low salt diet.  High salt diet can cause fluid to accumulate in your legs, abdomen and even around your lungs. You should try to avoid NSAID type over the counter pain medicines as best as possible. Tylenol is safe to take for 'routine' aches and pains, but never take more than 1/2 the dose suggested on the package instructions (never more than 2 grams per day). Avoid alcohol. Blood drug screen per your request, to be done in two weeks.  You can obtain a copy of these results when available. Return to see Dr. Christella Hartigan in 6 months, sooner if needed.

## 2011-09-10 ENCOUNTER — Telehealth: Payer: Self-pay

## 2011-09-10 ENCOUNTER — Telehealth: Payer: Self-pay | Admitting: Gastroenterology

## 2011-09-10 NOTE — Telephone Encounter (Signed)
i tried to reach the pt x 2 and his cell phone had no service.  I will await a call from him when he has service

## 2011-09-10 NOTE — Telephone Encounter (Signed)
Pt has been notified that Dr Christella Hartigan can not give him any work notes

## 2011-09-10 NOTE — Telephone Encounter (Signed)
The pt wants a letter saying that he can work in the sun while taking aldactone.  Please advise

## 2011-09-11 NOTE — Telephone Encounter (Signed)
Pt has been notified.

## 2011-09-11 NOTE — Telephone Encounter (Signed)
No, I will not write that letter. Given his other, multiple requests for various work letters it seems to be an unusual request to write a letter about sun sensitivity with aldactone.  Please reassure him though, that there are no problems with him being exposed to the sun while he takes aldactone.  Thank you.

## 2011-09-15 NOTE — Telephone Encounter (Signed)
See note from 09/01/2011

## 2011-10-20 ENCOUNTER — Other Ambulatory Visit: Payer: Self-pay | Admitting: Gastroenterology

## 2011-10-23 ENCOUNTER — Other Ambulatory Visit: Payer: Self-pay | Admitting: Gastroenterology

## 2011-10-23 MED ORDER — SPIRONOLACTONE 50 MG PO TABS: 50.0000 mg | ORAL_TABLET | Freq: Every day | ORAL | Status: DC

## 2011-10-23 MED ORDER — FUROSEMIDE 20 MG PO TABS: 20.0000 mg | ORAL_TABLET | Freq: Every day | ORAL | Status: DC

## 2011-10-23 NOTE — Telephone Encounter (Signed)
Prescriptions refilled.

## 2012-01-26 ENCOUNTER — Other Ambulatory Visit: Payer: Self-pay | Admitting: Gastroenterology

## 2012-05-25 ENCOUNTER — Telehealth: Payer: Self-pay | Admitting: *Deleted

## 2012-05-25 NOTE — Telephone Encounter (Signed)
Called pt to advise it is time for him to have his next yearly Hep A/B injection ( Twinrex).  Made appointment for him on 06-28-2012 at 9:00 am. Patient said he wrote it down.

## 2012-06-01 ENCOUNTER — Emergency Department (HOSPITAL_COMMUNITY)
Admission: EM | Admit: 2012-06-01 | Discharge: 2012-06-02 | Disposition: A | Discharge status: Home / Self Care | Payer: BC Managed Care – PPO | Attending: Emergency Medicine | Admitting: Emergency Medicine

## 2012-06-01 ENCOUNTER — Encounter (HOSPITAL_COMMUNITY): Payer: Self-pay

## 2012-06-01 DIAGNOSIS — F17200 Nicotine dependence, unspecified, uncomplicated: Secondary | ICD-10-CM | POA: Insufficient documentation

## 2012-06-01 DIAGNOSIS — S82409A Unspecified fracture of shaft of unspecified fibula, initial encounter for closed fracture: Secondary | ICD-10-CM

## 2012-06-01 DIAGNOSIS — Z7982 Long term (current) use of aspirin: Secondary | ICD-10-CM | POA: Insufficient documentation

## 2012-06-01 DIAGNOSIS — I10 Essential (primary) hypertension: Secondary | ICD-10-CM | POA: Insufficient documentation

## 2012-06-01 DIAGNOSIS — Z8639 Personal history of other endocrine, nutritional and metabolic disease: Secondary | ICD-10-CM | POA: Insufficient documentation

## 2012-06-01 DIAGNOSIS — R011 Cardiac murmur, unspecified: Secondary | ICD-10-CM | POA: Insufficient documentation

## 2012-06-01 DIAGNOSIS — Z9119 Patient's noncompliance with other medical treatment and regimen: Secondary | ICD-10-CM | POA: Insufficient documentation

## 2012-06-01 DIAGNOSIS — Y9289 Other specified places as the place of occurrence of the external cause: Secondary | ICD-10-CM | POA: Insufficient documentation

## 2012-06-01 DIAGNOSIS — Z8719 Personal history of other diseases of the digestive system: Secondary | ICD-10-CM | POA: Insufficient documentation

## 2012-06-01 DIAGNOSIS — Z862 Personal history of diseases of the blood and blood-forming organs and certain disorders involving the immune mechanism: Secondary | ICD-10-CM | POA: Insufficient documentation

## 2012-06-01 DIAGNOSIS — Z8673 Personal history of transient ischemic attack (TIA), and cerebral infarction without residual deficits: Secondary | ICD-10-CM | POA: Insufficient documentation

## 2012-06-01 DIAGNOSIS — S82839A Other fracture of upper and lower end of unspecified fibula, initial encounter for closed fracture: Secondary | ICD-10-CM | POA: Insufficient documentation

## 2012-06-01 DIAGNOSIS — I252 Old myocardial infarction: Secondary | ICD-10-CM | POA: Insufficient documentation

## 2012-06-01 DIAGNOSIS — Y9389 Activity, other specified: Secondary | ICD-10-CM | POA: Insufficient documentation

## 2012-06-01 DIAGNOSIS — Z91199 Patient's noncompliance with other medical treatment and regimen due to unspecified reason: Secondary | ICD-10-CM | POA: Insufficient documentation

## 2012-06-01 DIAGNOSIS — W230XXA Caught, crushed, jammed, or pinched between moving objects, initial encounter: Secondary | ICD-10-CM | POA: Insufficient documentation
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
## 2012-06-02 ENCOUNTER — Emergency Department (HOSPITAL_COMMUNITY): Payer: BC Managed Care – PPO

## 2012-06-02 ENCOUNTER — Telehealth (HOSPITAL_COMMUNITY): Payer: Self-pay | Admitting: Emergency Medicine

## 2012-06-02 MED ORDER — HYDROCODONE-IBUPROFEN 7.5-200 MG PO TABS: 1.0000 | ORAL_TABLET | Freq: Four times a day (QID) | ORAL | Status: DC | PRN

## 2012-06-02 MED ORDER — IBUPROFEN 200 MG PO TABS
400.0000 mg | ORAL_TABLET | Freq: Once | ORAL | Status: AC
  Administered 2012-06-02: 400 mg via ORAL
  Filled 2012-06-02: qty 2

## 2012-06-02 NOTE — ED Notes (Signed)
Ortho paged. 

## 2012-06-02 NOTE — ED Notes (Signed)
PA at bedside updating pt 
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
## 2012-06-02 NOTE — ED Notes (Addendum)
Pt calling States MD said he would call Rx in.  Med review showed Rx was printed, pt looked through his paperwork and found paper w/ signature

## 2012-06-02 NOTE — ED Notes (Signed)
Pt transported to XR.  

## 2012-06-02 NOTE — Progress Notes (Signed)
Orthopedic Tech Progress Note Patient Details:  Mark [name] [birth year] [id number] Placed Knee immob. On Lt. LE. Ortho Devices Type of Ortho Device: Knee Immobilizer Ortho Device/Splint Interventions: Application   [name] 06/02/2012, 2:52 AM

## 2012-06-02 NOTE — ED Provider Notes (Signed)
Medical screening examination/treatment/procedure(s) were performed by non-physician practitioner and as supervising physician I was immediately available for consultation/collaboration.  Sunnie Nielsen, MD 06/02/12 [phone]

## 2012-06-02 NOTE — ED Notes (Signed)
Pt. returned from XR. 

## 2012-06-03 NOTE — ED Notes (Signed)
Patient called for f/u MD name should be [facility]

## 2012-06-29 ENCOUNTER — Telehealth: Payer: Self-pay

## 2012-06-29 NOTE — Telephone Encounter (Signed)
Message copied by Donata Duff on Tue Jun 29, 2012 10:01 AM ------      Message from: Donata Duff      Created: Tue Jun 08, 2012  3:22 PM                   ----- Message -----         From: Rossie Muskrat, RN,CGRN         Sent: 06/08/2012   3:14 PM           To: Donata Duff, CMA            Needs twinrix #4 ------

## 2012-06-29 NOTE — Telephone Encounter (Signed)
Left message on machine to call back regarding annual twin rix

## 2012-06-30 NOTE — Telephone Encounter (Signed)
Left message on machine to call back letter mailed. 

## 2012-08-17 ENCOUNTER — Emergency Department (HOSPITAL_COMMUNITY)
Admission: EM | Admit: 2012-08-17 | Discharge: 2012-08-17 | Disposition: A | Discharge status: Home / Self Care | Payer: BC Managed Care – PPO | Attending: Emergency Medicine | Admitting: Emergency Medicine

## 2012-08-17 ENCOUNTER — Encounter (HOSPITAL_COMMUNITY): Payer: Self-pay | Admitting: *Deleted

## 2012-08-17 DIAGNOSIS — Z862 Personal history of diseases of the blood and blood-forming organs and certain disorders involving the immune mechanism: Secondary | ICD-10-CM | POA: Insufficient documentation

## 2012-08-17 DIAGNOSIS — E785 Hyperlipidemia, unspecified: Secondary | ICD-10-CM | POA: Insufficient documentation

## 2012-08-17 DIAGNOSIS — F1010 Alcohol abuse, uncomplicated: Secondary | ICD-10-CM | POA: Insufficient documentation

## 2012-08-17 DIAGNOSIS — Z8719 Personal history of other diseases of the digestive system: Secondary | ICD-10-CM | POA: Insufficient documentation

## 2012-08-17 DIAGNOSIS — Z8673 Personal history of transient ischemic attack (TIA), and cerebral infarction without residual deficits: Secondary | ICD-10-CM | POA: Insufficient documentation

## 2012-08-17 DIAGNOSIS — I252 Old myocardial infarction: Secondary | ICD-10-CM | POA: Insufficient documentation

## 2012-08-17 DIAGNOSIS — Z7982 Long term (current) use of aspirin: Secondary | ICD-10-CM | POA: Insufficient documentation

## 2012-08-17 DIAGNOSIS — Z8739 Personal history of other diseases of the musculoskeletal system and connective tissue: Secondary | ICD-10-CM | POA: Insufficient documentation

## 2012-08-17 DIAGNOSIS — K0889 Other specified disorders of teeth and supporting structures: Secondary | ICD-10-CM

## 2012-08-17 DIAGNOSIS — I10 Essential (primary) hypertension: Secondary | ICD-10-CM | POA: Insufficient documentation

## 2012-08-17 DIAGNOSIS — Z79899 Other long term (current) drug therapy: Secondary | ICD-10-CM | POA: Insufficient documentation

## 2012-08-17 DIAGNOSIS — Z8679 Personal history of other diseases of the circulatory system: Secondary | ICD-10-CM | POA: Insufficient documentation

## 2012-08-17 DIAGNOSIS — K089 Disorder of teeth and supporting structures, unspecified: Secondary | ICD-10-CM | POA: Insufficient documentation

## 2012-08-17 DIAGNOSIS — F17200 Nicotine dependence, unspecified, uncomplicated: Secondary | ICD-10-CM | POA: Insufficient documentation

## 2012-08-17 MED ORDER — HYDROCODONE-IBUPROFEN 7.5-200 MG PO TABS: 1.0000 | ORAL_TABLET | Freq: Four times a day (QID) | ORAL | Status: DC | PRN

## 2012-08-17 MED ORDER — HYDROCODONE-ACETAMINOPHEN 5-325 MG PO TABS: 1.0000 | ORAL_TABLET | Freq: Four times a day (QID) | ORAL | Status: DC | PRN

## 2012-08-17 MED ORDER — PENICILLIN V POTASSIUM 500 MG PO TABS: 500.0000 mg | ORAL_TABLET | Freq: Four times a day (QID) | ORAL | Status: AC

## 2012-08-17 NOTE — ED Notes (Signed)
Pt is here with left upper tooth pain and swelling for last three days

## 2012-08-17 NOTE — ED Provider Notes (Signed)
History     CSN: [id number]  Arrival date & time 08/17/12  1337   First MD Initiated Contact with Patient 08/17/12 1347      Chief Complaint  Patient presents with  . Dental Pain    (Consider location/radiation/quality/duration/timing/severity/associated sxs/prior treatment) HPI Comments: Patient presenting with pain of the left upper tooth pain.   Pain has been present for the past 3 days and is gradually worsening.  He denies any acute dental injury or trauma.  Pain associated with some gingiva swelling.  He has been taking Ibuprofen for the pain without relief.  He currently does not have a dentist.  Patient is a 57 y.o. male presenting with tooth pain. The history is provided by the patient.  Dental PainThe primary symptoms include mouth pain. Primary symptoms do not include dental injury, oral bleeding, oral lesions or fever. The symptoms are worsening. The symptoms occur constantly.  Additional symptoms include: gum swelling and gum tenderness. Additional symptoms do not include: dental sensitivity to temperature, purulent gums, trismus, facial swelling and trouble swallowing.    Past Medical History  Diagnosis Date  . Cirrhosis   . Alcohol abuse   . Anemia   . Thrombocytopenia   . Ascites   . Hypertension   . Hyperlipidemia   . Heart murmur   . Myocardial infarction   . Stroke   . Leg swelling     Past Surgical History  Procedure Laterality Date  . Knee arthroscopy      bilateral    Family History  Problem Relation Age of Onset  . Hypertension Father   . Diabetes Father   . Dementia Mother   . Lupus Sister     History  Substance Use Topics  . Smoking status: Current Every Day Smoker -- 0.25 packs/day    Types: Cigarettes  . Smokeless tobacco: Never Used     Comment: 4 cigarettes a day  . Alcohol Use: Yes     Comment: rarely      Review of Systems  Constitutional: Negative for fever.  HENT: Positive for dental problem. Negative for facial  swelling and trouble swallowing.   All other systems reviewed and are negative.    Allergies  Review of patient's allergies indicates no known allergies.  Home Medications   Current Outpatient Rx  Name  Route  Sig  Dispense  Refill  . HYDROcodone-ibuprofen (VICOPROFEN) 7.5-200 MG per tablet   Oral   Take 1 tablet by mouth every 6 (six) hours as needed for pain.   20 tablet   0   . ibuprofen (ADVIL,MOTRIN) 200 MG tablet   Oral   Take 200 mg by mouth every 6 (six) hours as needed for pain.         Marland Kitchen aspirin 81 MG tablet   Oral   Take 81 mg by mouth daily.           . furosemide (LASIX) 20 MG tablet   Oral   Take 1 tablet (20 mg total) by mouth daily.   30 tablet   11   . nadolol (CORGARD) 20 MG tablet      take 1 tablet by mouth once daily   30 tablet   11   . spironolactone (ALDACTONE) 50 MG tablet   Oral   Take 1 tablet (50 mg total) by mouth daily.   30 tablet   6     BP 109/61  Pulse 93  Temp(Src) 99.3 F (37.4 C) (Oral)  Resp 18  SpO2 95%  Physical Exam  Nursing note and vitals reviewed. Constitutional: He is oriented to person, place, and time. He appears well-developed and well-nourished. No distress.  HENT:  Head: Normocephalic and atraumatic. No trismus in the jaw.  Mouth/Throat: Uvula is midline, oropharynx is clear and moist and mucous membranes are normal. Abnormal dentition. No dental abscesses or edematous. No oropharyngeal exudate, posterior oropharyngeal edema, posterior oropharyngeal erythema or tonsillar abscesses.  Poor dental hygiene. Pt able to open and close mouth with out difficulty. Airway intact. Uvula midline. Mild gingival swelling with tenderness over the left upper gingiva, but no fluctuance. No swelling or tenderness of submental and submandibular regions.  No tongue elevation.  Neck: Normal range of motion and full passive range of motion without pain. Neck supple.  Cardiovascular: Normal rate and regular rhythm.    Pulmonary/Chest: Effort normal and breath sounds normal. No stridor. No respiratory distress. He has no wheezes.  Musculoskeletal: Normal range of motion.  Lymphadenopathy:       Head (right side): No submental, no submandibular, no tonsillar, no preauricular and no posterior auricular adenopathy present.       Head (left side): No submental, no submandibular, no tonsillar, no preauricular and no posterior auricular adenopathy present.    He has no cervical adenopathy.  Neurological: He is alert and oriented to person, place, and time.  Skin: Skin is warm and dry. No rash noted. He is not diaphoretic.    ED Course  Procedures (including critical care time)  Labs Reviewed - No data to display No results found.   No diagnosis found.    MDM  Patient with toothache.  No gross abscess.  Exam unconcerning for Ludwig's angina or spread of infection.  Will treat with penicillin and pain medicine.  Urged patient to follow-up with dentist.          Pascal Lux [place], PA-C 08/17/12 1534

## 2012-08-18 ENCOUNTER — Telehealth (HOSPITAL_COMMUNITY): Payer: Self-pay | Admitting: Emergency Medicine

## 2012-08-18 NOTE — ED Notes (Signed)
Dr. Lacretia Leigh office calling about ED Referral Form.

## 2012-08-19 NOTE — ED Provider Notes (Signed)
Medical screening examination/treatment/procedure(s) were performed by non-physician practitioner and as supervising physician I was immediately available for consultation/collaboration.  Donnetta Hutching, MD 08/19/12 [phone]

## 2012-11-12 ENCOUNTER — Other Ambulatory Visit: Payer: Self-pay | Admitting: Gastroenterology

## 2012-11-23 ENCOUNTER — Telehealth: Payer: Self-pay | Admitting: Gastroenterology

## 2012-11-23 MED ORDER — FUROSEMIDE 20 MG PO TABS: 20.0000 mg | ORAL_TABLET | Freq: Every day | ORAL | Status: DC

## 2012-11-23 MED ORDER — SPIRONOLACTONE 50 MG PO TABS: 50.0000 mg | ORAL_TABLET | Freq: Every day | ORAL | Status: DC

## 2012-11-23 MED ORDER — NADOLOL 20 MG PO TABS: ORAL_TABLET | ORAL | Status: DC

## 2012-11-23 NOTE — Telephone Encounter (Signed)
Pt has been scheduled for a f/u with Dr Christella Hartigan and he is aware that he can only have enough meds to last until the appt and NO refills will be given until seen

## 2012-12-22 ENCOUNTER — Encounter: Payer: Self-pay | Admitting: Gastroenterology

## 2012-12-22 ENCOUNTER — Other Ambulatory Visit (INDEPENDENT_AMBULATORY_CARE_PROVIDER_SITE_OTHER): Payer: BC Managed Care – PPO

## 2012-12-22 ENCOUNTER — Ambulatory Visit (INDEPENDENT_AMBULATORY_CARE_PROVIDER_SITE_OTHER): Payer: BC Managed Care – PPO | Admitting: Gastroenterology

## 2012-12-22 VITALS — BP 100/56 | HR 60 | Ht 67.0 in | Wt 156.6 lb

## 2012-12-22 DIAGNOSIS — K7460 Unspecified cirrhosis of liver: Secondary | ICD-10-CM

## 2012-12-22 LAB — PROTIME-INR
INR: 1.6 ratio — ABNORMAL HIGH (ref 0.8–1.0)
Prothrombin Time: 17 s — ABNORMAL HIGH (ref 10.2–12.4)

## 2012-12-22 LAB — CBC WITH DIFFERENTIAL/PLATELET
Basophils Absolute: 0 10*3/uL (ref 0.0–0.1)
Basophils Relative: 0.8 % (ref 0.0–3.0)
Eosinophils Absolute: 0.6 10*3/uL (ref 0.0–0.7)
Eosinophils Relative: 11.3 % — ABNORMAL HIGH (ref 0.0–5.0)
HCT: 35.2 % — ABNORMAL LOW (ref 39.0–52.0)
Hemoglobin: 11.9 g/dL — ABNORMAL LOW (ref 13.0–17.0)
Lymphocytes Relative: 28.7 % (ref 12.0–46.0)
Lymphs Abs: 1.5 10*3/uL (ref 0.7–4.0)
MCHC: 33.9 g/dL (ref 30.0–36.0)
MCV: 101.3 fl — ABNORMAL HIGH (ref 78.0–100.0)
Monocytes Absolute: 0.6 10*3/uL (ref 0.1–1.0)
Monocytes Relative: 11.1 % (ref 3.0–12.0)
Neutro Abs: 2.5 10*3/uL (ref 1.4–7.7)
Neutrophils Relative %: 48.1 % (ref 43.0–77.0)
Platelets: 127 10*3/uL — ABNORMAL LOW (ref 150.0–400.0)
RBC: 3.47 Mil/uL — ABNORMAL LOW (ref 4.22–5.81)
RDW: 15.1 % — ABNORMAL HIGH (ref 11.5–14.6)
WBC: 5.2 10*3/uL (ref 4.5–10.5)

## 2012-12-22 MED ORDER — NADOLOL 20 MG PO TABS: ORAL_TABLET | ORAL | Status: DC

## 2012-12-22 MED ORDER — FUROSEMIDE 20 MG PO TABS: 20.0000 mg | ORAL_TABLET | Freq: Every day | ORAL | Status: DC

## 2012-12-22 MED ORDER — SPIRONOLACTONE 50 MG PO TABS: 50.0000 mg | ORAL_TABLET | Freq: Every day | ORAL | Status: DC

## 2012-12-22 NOTE — Progress Notes (Signed)
Review of pertinent gastrointestinal problems:  1. Cirrhosis (likely related to alcohol; blood work May 2012 show hepatitis C negative, hepatitis B surface antigen negative, hepatitis B surface antibody negative, iron levels low, ANA negative, AMA negative, anti-smooth muscle antibody weakly positive)  Labs 05/2011: Tbili 2.1, platelets 120s, INR around 1.6. Albumin 1.9, AST 80s, ALT normal  EGD, September 2012 found small to medium esophageal varices, portal gastropathy, no gastric varices. Nadolol was started.  Most recent imaging: [place] 06/2011 showed cirrhosis, no discrete masses, +gallstones in GB; no ascites  Most recent alpha-fetoprotein: 04/2011 was normal  Duplex US 3/12 showed portal vein thrombus with NO flow in main portal vein  Hepatitis A/B immunization series started 05/2011   HPI: This is a  very pleasant 57 year old man whom I last saw about a year ago.  Has been about a year.  He's doing pretty good.  He needs medication refills.    Only minor issues with swelling in legs.  No overt GI bleeding.  Up about 1-2 pounds in last year (our scales).  Drinks a beer "every now and then" total about 4 per weeks.  Still uses nasal cocaine "every now and then."     Past Medical History  Diagnosis Date  . Cirrhosis   . Alcohol abuse   . Anemia   . Thrombocytopenia   . Ascites   . Hypertension   . Hyperlipidemia   . Heart murmur   . Myocardial infarction   . Stroke   . Leg swelling     Past Surgical History  Procedure Laterality Date  . Knee arthroscopy      bilateral    Current Outpatient Prescriptions  Medication Sig Dispense Refill  . aspirin 81 MG tablet Take 81 mg by mouth daily.        . furosemide (LASIX) 20 MG tablet Take 1 tablet (20 mg total) by mouth daily.  30 tablet  0  . HYDROcodone-ibuprofen (VICOPROFEN) 7.5-200 MG per tablet Take 1 tablet by mouth every 6 (six) hours as needed for pain.  20 tablet  0  . ibuprofen (ADVIL,MOTRIN) 200 MG tablet Take 200  mg by mouth every 6 (six) hours as needed for pain.      . nadolol (CORGARD) 20 MG tablet take 1 tablet by mouth once daily  30 tablet  0  . spironolactone (ALDACTONE) 50 MG tablet Take 1 tablet (50 mg total) by mouth daily.  30 tablet  0   No current facility-administered medications for this visit.    Allergies as of 12/22/2012  . (No Known Allergies)    Family History  Problem Relation Age of Onset  . Hypertension Father   . Diabetes Father   . Dementia Mother   . Lupus Sister     History   Social History  . Marital Status: Married    Spouse Name: N/A    Number of Children: 4  . Years of Education: N/A   Occupational History  . Maintenance Retired   Social History Main Topics  . Smoking status: Current Every Day Smoker -- 0.25 packs/day    Types: Cigarettes  . Smokeless tobacco: Never Used     Comment: 4 cigarettes a day  . Alcohol Use: Yes     Comment: rarely  . Drug Use: Yes    Special: Marijuana  . Sexual Activity: Not on file     Comment: now and then smokies marijuana   Other Topics Concern  . Not on file  Social History Narrative  . No narrative on file      Physical Exam: BP 100/56  Pulse 60  Ht 5\' 7"  (1.702 m)  Wt 156 lb 9.6 oz (71.033 kg)  BMI 24.52 kg/m2 Constitutional: generally well-appearing Psychiatric: alert and oriented x3 Abdomen: soft, nontender, nondistended, no obvious ascites, no peritoneal signs, normal bowel sounds Trace to 1+ pitting edema in ankles bilaterally    Assessment and plan: 57 y.o. male with alcohol related cirrhosis, continues to drink periodically, continues to do cocaine periodically  First I recommended he stop drinking completely and that he stop using illicit drugs as well. She also needs to stop smoking. Clinically his liver appears to be fairly well compensated he has only trace to 1+ edema in his legs. He is due for hepatoma screening as well as restaging of his liver disease with blood tests. The mild  edema in his legs do not bother him at all and saw will likely be not altering his diuretic regimen. I gave him some basic advice as well about cirrhosis. I would like to see him back in 6 months and sooner if needed. We will refill his diuretics and his nonselective beta blocker. He is

## 2012-12-22 NOTE — Patient Instructions (Addendum)
One of your biggest health concerns is your smoking.  This increases your risk for most cancers and serious cardiovascular diseases such as strokes, heart attacks.  You should try your best to stop.  If you need assistance, please contact your PCP or Smoking Cessation Class at [facility] [phone]) or [facility] Quit-Line (1-800-QUIT-NOW). Med refills for lasix, aldactone, nadolol given. You will have labs checked today in the basement lab.  Please head down after you check out with the front desk  (cbc, cmet, inr, AFP) You will be set up for an ultrasound of liver to screen for hepatoma.  You have been scheduled for an abdominal ultrasound at [facility] (1st floor of hospital) on 12/23/12 at 8 am. Please arrive 15 minutes prior to your appointment for registration. Make certain not to have anything to eat or drink 6 hours prior to your appointment. Should you need to reschedule your appointment, please contact radiology at [phone]. This test typically takes about 30 minutes to perform.  It is important that you have a relatively low salt diet.  High salt diet can cause fluid to accumulate in your legs, abdomen and even around your lungs. You should try to avoid NSAID type over the counter pain medicines as best as possible. Tylenol is safe to take for 'routine' aches and pains, but never take more than 1/2 the dose suggested on the package instructions (never more than 2 grams per day). Avoid alcohol completely, it makes your liver worse. Please return to see Dr. Christella Hartigan in 6 months, sooner if needed.                                              We are excited to introduce MyChart, a new best-in-class service that provides you online access to important information in your electronic medical record. We want to make it easier for you to view your health information - all in one secure location - when and where you need it. We expect MyChart will enhance the quality of care and  service we provide.  When you register for MyChart, you can:    View your test results.    Request appointments and receive appointment reminders via email.    Request medication renewals.    View your medical history, allergies, medications and immunizations.    Communicate with your physician's office through a password-protected site.    Conveniently print information such as your medication lists.  To find out if MyChart is right for you, please talk to a member of our clinical staff today. We will gladly answer your questions about this free health and wellness tool.  If you are age 59 or older and want a member of your family to have access to your record, you must provide written consent by completing a proxy form available at our office. Please speak to our clinical staff about guidelines regarding accounts for patients younger than age 67.  As you activate your MyChart account and need any technical assistance, please call the MyChart technical support line at [phone]-CHART [phone]) or email your question to [name]. If you email your question(s), please include your name, a return phone number and the best time to reach you.  If you have non-urgent health-related questions, you can send a message to our office through MyChart at [email or web address]. If you have a medical emergency,  call 911.  Thank you for using MyChart as your new health and wellness resource!   MyChart licensed from [employer],  1191-4782. Patents Pending.

## 2012-12-23 ENCOUNTER — Ambulatory Visit (HOSPITAL_COMMUNITY): Payer: BC Managed Care – PPO

## 2012-12-23 LAB — COMPREHENSIVE METABOLIC PANEL
ALT: 26 U/L (ref 0–53)
AST: 76 U/L — ABNORMAL HIGH (ref 0–37)
Albumin: 2.4 g/dL — ABNORMAL LOW (ref 3.5–5.2)
Alkaline Phosphatase: 128 U/L — ABNORMAL HIGH (ref 39–117)
BUN: 6 mg/dL (ref 6–23)
CO2: 29 mEq/L (ref 19–32)
Calcium: 8 mg/dL — ABNORMAL LOW (ref 8.4–10.5)
Chloride: 104 mEq/L (ref 96–112)
Creatinine, Ser: 1 mg/dL (ref 0.4–1.5)
GFR: 100.57 mL/min (ref >60.00)
Glucose, Bld: 84 mg/dL (ref 70–99)
Potassium: 4.6 mEq/L (ref 3.5–5.1)
Sodium: 135 mEq/L (ref 135–145)
Total Bilirubin: 1.6 mg/dL — ABNORMAL HIGH (ref 0.3–1.2)
Total Protein: 7.3 g/dL (ref 6.0–8.3)

## 2012-12-23 LAB — AFP TUMOR MARKER: AFP-Tumor Marker: 3.1 ng/mL (ref 0.0–8.0)

## 2013-01-03 ENCOUNTER — Ambulatory Visit (HOSPITAL_COMMUNITY)
Admission: RE | Admit: 2013-01-03 | Discharge: 2013-01-03 | Disposition: A | Discharge status: Home / Self Care | Payer: BC Managed Care – PPO | Source: Ambulatory Visit | Attending: Gastroenterology | Admitting: Gastroenterology

## 2013-01-03 DIAGNOSIS — K8020 Calculus of gallbladder without cholecystitis without obstruction: Secondary | ICD-10-CM | POA: Insufficient documentation

## 2013-01-03 DIAGNOSIS — N281 Cyst of kidney, acquired: Secondary | ICD-10-CM | POA: Insufficient documentation

## 2013-01-03 DIAGNOSIS — K7460 Unspecified cirrhosis of liver: Secondary | ICD-10-CM | POA: Insufficient documentation

## 2013-04-14 ENCOUNTER — Ambulatory Visit (INDEPENDENT_AMBULATORY_CARE_PROVIDER_SITE_OTHER): Payer: BC Managed Care – PPO | Admitting: Cardiovascular Disease

## 2013-04-14 ENCOUNTER — Encounter: Payer: Self-pay | Admitting: Cardiovascular Disease

## 2013-04-14 ENCOUNTER — Encounter (INDEPENDENT_AMBULATORY_CARE_PROVIDER_SITE_OTHER): Payer: Self-pay

## 2013-04-14 VITALS — BP 132/74 | HR 72 | Ht 68.0 in | Wt 159.4 lb

## 2013-04-14 DIAGNOSIS — R188 Other ascites: Secondary | ICD-10-CM

## 2013-04-14 DIAGNOSIS — Z01810 Encounter for preprocedural cardiovascular examination: Secondary | ICD-10-CM

## 2013-04-14 DIAGNOSIS — R011 Cardiac murmur, unspecified: Secondary | ICD-10-CM | POA: Insufficient documentation

## 2013-04-14 DIAGNOSIS — K7030 Alcoholic cirrhosis of liver without ascites: Secondary | ICD-10-CM

## 2013-04-14 HISTORY — DX: Encounter for preprocedural cardiovascular examination: Z01.810

## 2013-04-14 NOTE — Assessment & Plan Note (Signed)
Clear to have surgery from a cardiac perspective.  Biggest risk is post op ETOH withdrawal and bleeding due to thrombocytopenia.  Not an ideal patient for post op anticoagulation either with some varices and low PLTls  Will do echo to make sure EF still normal  Post op telemetry given history of SVT and night time palpitations Should continue nadolol until morning of surgery and be given iv lopressor PRN until taking PO post op

## 2013-04-14 NOTE — Assessment & Plan Note (Signed)
FU Dr Christella Hartigan  Continue nadolol lasix and aldactone  May need f/u EGD for varices.

## 2013-04-14 NOTE — Progress Notes (Signed)
Patient ID: Mark [name], male   DOB: May 09, 1955, 57 y.o.   MRN: [id number]   57 yo referred for preop clearence.  Has chronic alcoholic liver disease with cirrhosis and portal hypertension Previous anasarca and thrombocytopenia.  Sees Dr Christella Hartigan On aldactone, nadolol and lasix for liver disease INR is elevatred at 1.6 and PlT count around 127  Still drinking and smoking  Injured knee at work about 8 months ago on right and failed conservative Rx  Surgery set for 1/5  Has palpitations at night No syncope No chest pain or dyspnea.  Seen by Dr Amil Amen in 2011 for question of SVT  Echo done 7/11 with normal EF 65% and no valve disease.       ROS: Denies fever, malais, weight loss, blurry vision, decreased visual acuity, cough, sputum, SOB, hemoptysis, pleuritic pain, palpitaitons, heartburn, abdominal pain, melena, lower extremity edema, claudication, or rash.  All other systems reviewed and negative   General: Affect appropriate Disheveled black male  HEENT: Muddy sclera  Neck supple with no adenopathy JVP normal no bruits no thyromegaly Lungs clear with no wheezing and good diaphragmatic motion Heart:  S1/S2 SEM  murmur,rub, gallop or click PMI normal Abdomen: benighn, BS positve, no tenderness, no AAA no bruit.  No HSM or HJR Distal pulses intact with no bruits No edema Neuro non-focal Skin warm and dry No muscular weakness  Medications Current Outpatient Prescriptions  Medication Sig Dispense Refill  . aspirin 81 MG tablet Take 81 mg by mouth daily.        . furosemide (LASIX) 20 MG tablet Take 1 tablet (20 mg total) by mouth daily.  30 tablet  11  . HYDROcodone-ibuprofen (VICOPROFEN) 7.5-200 MG per tablet Take 1 tablet by mouth every 6 (six) hours as needed for pain.  20 tablet  0  . ibuprofen (ADVIL,MOTRIN) 200 MG tablet Take 200 mg by mouth every 6 (six) hours as needed for pain.      . nadolol (CORGARD) 20 MG tablet take 1 tablet by mouth once daily  30 tablet  11  .  spironolactone (ALDACTONE) 50 MG tablet Take 1 tablet (50 mg total) by mouth daily.  30 tablet  11   No current facility-administered medications for this visit.    Allergies Review of patient's allergies indicates no known allergies.  Family History: Family History  Problem Relation Age of Onset  . Hypertension Father   . Diabetes Father   . Dementia Mother   . Lupus Sister     Social History: History   Social History  . Marital Status: Married    Spouse Name: N/A    Number of Children: 4  . Years of Education: N/A   Occupational History  . Maintenance [employer]   Social History Main Topics  . Smoking status: Current Every Day Smoker -- 0.25 packs/day    Types: Cigarettes  . Smokeless tobacco: Never Used     Comment: 4 cigarettes a day  . Alcohol Use: Yes     Comment: rarely  . Drug Use: Yes    Special: Marijuana  . Sexual Activity: Not on file     Comment: now and then smokies marijuana   Other Topics Concern  . Not on file   Social History Narrative  . No narrative on file    Electrocardiogram:  10/12  SR rate 68 normal   Today NSR rate 65 normal except T wave inversion in lead 3  Assessment and Plan

## 2013-04-14 NOTE — Patient Instructions (Signed)
Your physician recommends that you schedule a follow-up appointment in: AS NEEDED Your physician recommends that you continue on your current medications as directed. Please refer to the Current Medication list given to you today.  Your physician has requested that you have an echocardiogram. Echocardiography is a painless test that uses sound waves to create images of your heart. It provides your doctor with information about the size and shape of your heart and how well your heart's chambers and valves are working. This procedure takes approximately one hour. There are no restrictions for this procedure.  HAVING  SURGERY  04-29-13  NEED ECHO  BEFORE

## 2013-04-14 NOTE — Assessment & Plan Note (Signed)
Benign AV sclerosis Echo being done to asses EF preop in patient with ETOH abuse

## 2013-04-18 ENCOUNTER — Encounter (HOSPITAL_COMMUNITY): Payer: Self-pay | Admitting: Pharmacy Technician

## 2013-04-19 ENCOUNTER — Other Ambulatory Visit (HOSPITAL_COMMUNITY): Payer: Self-pay | Admitting: *Deleted

## 2013-04-19 NOTE — Patient Instructions (Signed)
ANZEL KEARSE  04/19/2013                           YOUR PROCEDURE IS SCHEDULED ON: 05/02/13               PLEASE REPORT TO SHORT STAY CENTER AT : 5:00 AM               CALL THIS NUMBER IF ANY PROBLEMS THE DAY OF SURGERY :               [phone]                      REMEMBER:   Do not eat food or drink liquids AFTER MIDNIGHT   Take these medicines the morning of surgery with A SIP OF WATER:   Do not wear jewelry, make-up   Do not wear lotions, powders, or perfumes.   Do not shave legs or underarms 12 hrs. before surgery (men may shave face)  Do not bring valuables to the hospital.  Contacts, dentures or bridgework may not be worn into surgery.  Leave suitcase in the car. After surgery it may be brought to your room.  For patients admitted to the hospital more than one night, checkout time is 11:00                          The day of discharge.   Patients discharged the day of surgery will not be allowed to drive home                             If going home same day of surgery, must have someone stay with you first                           24 hrs at home and arrange for some one to drive you home from hospital.    Special Instructions:   Please read over the following fact sheets that you were given:               1. MRSA  INFORMATION                      2. [place] PREPARING FOR SURGERY SHEET               3. INCENTIVE SPIROMETER                                                X_____________________________________________________________________        Failure to follow these instructions may result in cancellation of your surgery

## 2013-04-25 ENCOUNTER — Inpatient Hospital Stay (HOSPITAL_COMMUNITY)
Admission: RE | Admit: 2013-04-25 | Discharge: 2013-04-25 | Disposition: A | Discharge status: Home / Self Care | Payer: Self-pay | Source: Ambulatory Visit

## 2013-04-26 ENCOUNTER — Encounter (HOSPITAL_COMMUNITY)
Admission: RE | Admit: 2013-04-26 | Discharge: 2013-04-26 | Disposition: A | Discharge status: Home / Self Care | Payer: BC Managed Care – PPO | Source: Ambulatory Visit | Attending: Orthopedic Surgery | Admitting: Orthopedic Surgery

## 2013-04-26 ENCOUNTER — Encounter (HOSPITAL_COMMUNITY): Payer: Self-pay

## 2013-04-26 ENCOUNTER — Ambulatory Visit (HOSPITAL_COMMUNITY)
Admission: RE | Admit: 2013-04-26 | Discharge: 2013-04-26 | Disposition: A | Discharge status: Home / Self Care | Payer: BC Managed Care – PPO | Source: Ambulatory Visit | Attending: Orthopedic Surgery | Admitting: Orthopedic Surgery

## 2013-04-26 DIAGNOSIS — M12569 Traumatic arthropathy, unspecified knee: Secondary | ICD-10-CM | POA: Insufficient documentation

## 2013-04-26 DIAGNOSIS — Z01812 Encounter for preprocedural laboratory examination: Secondary | ICD-10-CM | POA: Insufficient documentation

## 2013-04-26 DIAGNOSIS — Z01818 Encounter for other preprocedural examination: Secondary | ICD-10-CM | POA: Insufficient documentation

## 2013-04-26 HISTORY — DX: Unspecified osteoarthritis, unspecified site: M19.90

## 2013-04-26 LAB — COMPREHENSIVE METABOLIC PANEL
ALT: 23 U/L (ref 0–53)
AST: 67 U/L — ABNORMAL HIGH (ref 0–37)
Albumin: 2 g/dL — ABNORMAL LOW (ref 3.5–5.2)
Alkaline Phosphatase: 177 U/L — ABNORMAL HIGH (ref 39–117)
BUN: 6 mg/dL (ref 6–23)
CO2: 26 mEq/L (ref 19–32)
Calcium: 8 mg/dL — ABNORMAL LOW (ref 8.4–10.5)
Chloride: 105 mEq/L (ref 96–112)
Creatinine, Ser: 1.03 mg/dL (ref 0.50–1.35)
GFR calc Af Amer: 90 mL/min — ABNORMAL LOW (ref >90)
GFR calc non Af Amer: 78 mL/min — ABNORMAL LOW (ref >90)
Glucose, Bld: 105 mg/dL — ABNORMAL HIGH (ref 70–99)
Potassium: 5 mEq/L (ref 3.7–5.3)
Sodium: 139 mEq/L (ref 137–147)
Total Bilirubin: 1.8 mg/dL — ABNORMAL HIGH (ref 0.3–1.2)
Total Protein: 6.9 g/dL (ref 6.0–8.3)

## 2013-04-26 LAB — URINALYSIS, ROUTINE W REFLEX MICROSCOPIC
Bilirubin Urine: NEGATIVE
Glucose, UA: NEGATIVE mg/dL
Hgb urine dipstick: NEGATIVE
Ketones, ur: NEGATIVE mg/dL
Leukocytes, UA: NEGATIVE
Nitrite: NEGATIVE
Protein, ur: NEGATIVE mg/dL
Specific Gravity, Urine: 1.009 (ref 1.005–1.030)
Urobilinogen, UA: 2 mg/dL — ABNORMAL HIGH (ref 0.0–1.0)
pH: 7.5 (ref 5.0–8.0)

## 2013-04-26 LAB — CBC
HCT: 30.9 % — ABNORMAL LOW (ref 39.0–52.0)
Hemoglobin: 10.9 g/dL — ABNORMAL LOW (ref 13.0–17.0)
MCH: 35.2 pg — ABNORMAL HIGH (ref 26.0–34.0)
MCHC: 35.3 g/dL (ref 30.0–36.0)
MCV: 99.7 fL (ref 78.0–100.0)
Platelets: 131 10*3/uL — ABNORMAL LOW (ref 150–400)
RBC: 3.1 MIL/uL — ABNORMAL LOW (ref 4.22–5.81)
RDW: 14.8 % (ref 11.5–15.5)
WBC: 4.7 10*3/uL (ref 4.0–10.5)

## 2013-04-26 LAB — PROTIME-INR
INR: 1.53 — ABNORMAL HIGH (ref 0.00–1.49)
Prothrombin Time: 18 seconds — ABNORMAL HIGH (ref 11.6–15.2)

## 2013-04-26 LAB — SURGICAL PCR SCREEN
MRSA, PCR: POSITIVE — AB
Staphylococcus aureus: POSITIVE — AB

## 2013-04-26 LAB — ABO/RH: ABO/RH(D): B POS

## 2013-04-26 LAB — APTT: aPTT: 35 seconds (ref 24–37)

## 2013-04-26 NOTE — Progress Notes (Signed)
EKG 12/14 EPIC 

## 2013-04-26 NOTE — Progress Notes (Signed)
Faxed CBC, CMET, PT, U/A,and PCR to Dr Charlann Boxer through [facility] at [phone].  Left message with Rosalva Ferron at [facility] of POSITIVE PCR.  Left message for patient to call me back regarding PCR report on cell- [phone]

## 2013-04-26 NOTE — Progress Notes (Signed)
LOV with clearance and recommendations  Dr Eden Emms 12/14 EPIC and chart

## 2013-04-26 NOTE — Patient Instructions (Addendum)
[name]  04/26/2013   Your procedure is scheduled on:  05/02/13  MONDAY  Report to [facility] at   0500    AM.  Call this number if you have problems the morning of surgery: [phone]       Remember:   Do not eat food  Or drink :After Midnight. Sunday NIGHT   Take these medicines the morning of surgery with A SIP OF WATER:  NONE   .  Contacts, dentures or partial plates can not be worn to surgery  Leave suitcase in the car. After surgery it may be brought to your room.  For patients admitted to the hospital, checkout time is 11:00 AM day of  discharge.             SPECIAL INSTRUCTIONS- SEE [facility] PREPARING FOR SURGERY INSTRUCTION SHEET-     DO NOT WEAR JEWELRY, LOTIONS, POWDERS, OR PERFUMES.  WOMEN-- DO NOT SHAVE LEGS OR UNDERARMS FOR 12 HOURS BEFORE SHOWERS. MEN MAY SHAVE FACE.  Patients discharged the day of surgery will not be allowed to drive home. IF going home the day of surgery, you must have a driver and someone to stay with you for the first 24 hours  Name and phone number of your driver:    admission                                                                    Please read over the following fact sheets that you were given: MRSA Information, Incentive Spirometry Sheet, Blood Transfusion Sheet  Information                                                                                   [name]  PST 336  [phone]                 FAILURE TO FOLLOW THESE INSTRUCTIONS MAY RESULT IN  CANCELLATION   OF YOUR SURGERY                                                  Patient Signature _____________________________

## 2013-04-27 ENCOUNTER — Ambulatory Visit (HOSPITAL_COMMUNITY): Payer: Self-pay

## 2013-04-27 NOTE — Progress Notes (Signed)
History reviewed by Dr Acey Lav- ordered urine drug screen pre op.  Patient had been instructed at PST appt to dring increased fluids Sunday so he would be able to give [place] a urine specimen if needed

## 2013-04-29 NOTE — H&P (Signed)
Mark KNEE ADMISSION H&P  Patient is being admitted for Mark Mark knee arthroplasty.  Subjective:  Chief Complaint:    Mark knee OA / pain.  HPI: Mark [name], 58 y.o. male, has a history of pain and functional disability in the Mark knee due to [name] and has failed non-surgical conservative treatments for greater than 12 weeks to includeNSAID's and/or analgesics, corticosteriod injections and activity modification.  Onset of symptoms was gradual, starting 4-5 years ago with gradually worsening course since that time. The patient noted prior procedures on the knee to include  arthroscopy on the Mark knee(s).  Patient currently rates pain in the Mark knee(s) at 10 out of 10 with activity. Patient has night pain, worsening of pain with activity and weight bearing, pain that interferes with activities of daily living, pain with passive range of [name], crepitus and joint Mark.  Patient has evidence of periarticular osteophytes and joint space narrowing by imaging studies. There is no active infection.  Risks, benefits and expectations were discussed with the patient.  Risks including but not limited to the risk of anesthesia, blood clots, nerve damage, blood vessel damage, failure of the prosthesis, infection and up to and including death.  Patient understand the risks, benefits and expectations and wishes to proceed with surgery.   D/C Plans:   Home with HHPT  Post-op Meds:    No Rx given  Tranexamic Acid:   Not to be given - previous TIA & MI  Decadron:    To be given  FYI:    ASA post-op  Norco post-op   Patient Active Problem List   Diagnosis Date Noted  . Preop cardiovascular exam 04/14/2013  . Murmur 04/14/2013  . Mark inguinal hernia 12/26/2010  . Cirrhosis, alcoholic 32/20/2542   Past Medical History  Diagnosis Date  . Cirrhosis   . Alcohol abuse   . Anemia   . Thrombocytopenia   . Ascites   . Hypertension   . Hyperlipidemia   . Heart murmur   .  Myocardial infarction   . Leg Mark   . Stroke     no deficits  . [name]     Past Surgical History  Procedure Laterality Date  . Knee arthroscopy      bilateral/  12/14  . Hernia repair Mark     inguinal    No prescriptions prior to admission   No Known Allergies   History  Substance Use Topics  . Smoking status: Current Every Day Smoker -- 0.25 packs/day for 4 years    Types: Cigarettes  . Smokeless tobacco: Never Used     Comment: 4 cigarettes a day  . Alcohol Use: Yes     Comment:  beer  80 oz  week    Family History  Problem Relation Age of Onset  . Hypertension Father   . Diabetes Father   . Dementia Mother   . Lupus Sister      Review of Systems  Constitutional: Negative.   HENT: Negative.   Eyes: Negative.   Respiratory: Negative.   Cardiovascular: Negative.   Gastrointestinal: Negative.   Genitourinary: Negative.   Musculoskeletal: Positive for joint pain.  Skin: Negative.   Neurological: Negative.   Endo/Heme/Allergies: Negative.   Psychiatric/Behavioral: Negative.     Objective:  Physical Exam  Constitutional: He is oriented to person, place, and time. He appears well-developed and well-nourished.  HENT:  Head: Normocephalic and atraumatic.  Mouth/Throat: Oropharynx is clear and moist.  Eyes: Pupils are  equal, round, and reactive to light.  Neck: Neck supple. No JVD present. No tracheal deviation present. No thyromegaly present.  Cardiovascular: Normal rate, regular rhythm and intact distal pulses.   Murmur heard. Respiratory: Effort normal and breath sounds normal. No stridor. No respiratory distress. He has no wheezes.  GI: Soft. There is no tenderness. There is no guarding.  Musculoskeletal:       Mark knee: He exhibits decreased range of [name], Mark and bony tenderness. He exhibits no effusion, no ecchymosis, no deformity, no laceration and no erythema. Tenderness found.  Lymphadenopathy:    He has no cervical adenopathy.   Neurological: He is alert and oriented to person, place, and time.  Skin: Skin is warm and dry.  Psychiatric: He has a normal mood and affect.     Labs:  Estimated body mass index is 24.52 kg/(m^2) as calculated from the following:   Height as of 12/22/12: 5\' 7"  (1.702 m).   Weight as of 12/22/12: 71.033 kg (156 lb 9.6 oz).   Imaging Review Plain radiographs demonstrate moderate degenerative joint disease of the Mark knee(s). The overall alignment is neutral. The bone quality appears to be good for age and reported activity level.  Assessment/Plan:  End stage [name], Mark knee   The patient history, physical examination, clinical judgment of the provider and imaging studies are consistent with end stage degenerative joint disease of the Mark knee(s) and Mark knee arthroplasty is deemed medically necessary. The treatment options including medical management, injection therapy arthroscopy and arthroplasty were discussed at length. The risks and benefits of Mark knee arthroplasty were presented and reviewed. The risks due to aseptic loosening, infection, stiffness, patella tracking problems, thromboembolic complications and other imponderables were discussed. The patient acknowledged the explanation, agreed to proceed with the plan and consent was signed. Patient is being admitted for inpatient treatment for surgery, pain control, PT, OT, prophylactic antibiotics, VTE prophylaxis, progressive ambulation and ADL's and discharge planning. The patient is planning to be discharged home with home health services.    [place] Pugh Sabiha Sura   PAC  04/29/2013, 12:24 PM

## 2013-05-02 ENCOUNTER — Inpatient Hospital Stay (HOSPITAL_COMMUNITY)
Admission: RE | Admit: 2013-05-02 | Discharge: 2013-05-03 | DRG: 470 | Disposition: A | Discharge status: Home Health | Payer: Worker's Compensation | Source: Ambulatory Visit | Attending: Orthopedic Surgery | Admitting: Orthopedic Surgery

## 2013-05-02 ENCOUNTER — Encounter (HOSPITAL_COMMUNITY): Payer: Worker's Compensation | Admitting: Certified Registered Nurse Anesthetist

## 2013-05-02 ENCOUNTER — Inpatient Hospital Stay (HOSPITAL_COMMUNITY): Payer: Worker's Compensation | Admitting: Certified Registered Nurse Anesthetist

## 2013-05-02 ENCOUNTER — Encounter (HOSPITAL_COMMUNITY): Payer: Self-pay | Admitting: *Deleted

## 2013-05-02 ENCOUNTER — Encounter (HOSPITAL_COMMUNITY)
Admission: RE | Disposition: A | Discharge status: Home Health | Payer: Self-pay | Source: Ambulatory Visit | Attending: Orthopedic Surgery

## 2013-05-02 DIAGNOSIS — M1710 Unilateral primary osteoarthritis, unspecified knee: Principal | ICD-10-CM | POA: Diagnosis present

## 2013-05-02 DIAGNOSIS — M25469 Effusion, unspecified knee: Secondary | ICD-10-CM | POA: Diagnosis present

## 2013-05-02 DIAGNOSIS — Z96651 Presence of right artificial knee joint: Secondary | ICD-10-CM

## 2013-05-02 DIAGNOSIS — I10 Essential (primary) hypertension: Secondary | ICD-10-CM | POA: Diagnosis present

## 2013-05-02 DIAGNOSIS — M898X9 Other specified disorders of bone, unspecified site: Secondary | ICD-10-CM | POA: Diagnosis present

## 2013-05-02 DIAGNOSIS — Z8673 Personal history of transient ischemic attack (TIA), and cerebral infarction without residual deficits: Secondary | ICD-10-CM

## 2013-05-02 DIAGNOSIS — D62 Acute posthemorrhagic anemia: Secondary | ICD-10-CM | POA: Diagnosis not present

## 2013-05-02 DIAGNOSIS — I252 Old myocardial infarction: Secondary | ICD-10-CM

## 2013-05-02 DIAGNOSIS — R011 Cardiac murmur, unspecified: Secondary | ICD-10-CM | POA: Diagnosis present

## 2013-05-02 DIAGNOSIS — Z96659 Presence of unspecified artificial knee joint: Secondary | ICD-10-CM

## 2013-05-02 DIAGNOSIS — M6580 Other synovitis and tenosynovitis, unspecified site: Secondary | ICD-10-CM | POA: Diagnosis present

## 2013-05-02 DIAGNOSIS — E785 Hyperlipidemia, unspecified: Secondary | ICD-10-CM | POA: Diagnosis present

## 2013-05-02 DIAGNOSIS — D500 Iron deficiency anemia secondary to blood loss (chronic): Secondary | ICD-10-CM | POA: Diagnosis not present

## 2013-05-02 DIAGNOSIS — Z8249 Family history of ischemic heart disease and other diseases of the circulatory system: Secondary | ICD-10-CM

## 2013-05-02 DIAGNOSIS — F17200 Nicotine dependence, unspecified, uncomplicated: Secondary | ICD-10-CM | POA: Diagnosis present

## 2013-05-02 DIAGNOSIS — K7030 Alcoholic cirrhosis of liver without ascites: Secondary | ICD-10-CM | POA: Diagnosis present

## 2013-05-02 HISTORY — PX: TOTAL KNEE ARTHROPLASTY: SHX125

## 2013-05-02 LAB — TYPE AND SCREEN
ABO/RH(D): B POS
Antibody Screen: NEGATIVE

## 2013-05-02 LAB — RAPID URINE DRUG SCREEN, HOSP PERFORMED
Amphetamines: NOT DETECTED
Barbiturates: NOT DETECTED
Benzodiazepines: NOT DETECTED
Cocaine: NOT DETECTED
Opiates: POSITIVE — AB
Tetrahydrocannabinol: NOT DETECTED

## 2013-05-02 LAB — POCT I-STAT 4, (NA,K, GLUC, HGB,HCT)
Glucose, Bld: 85 mg/dL (ref 70–99)
HCT: 33 % — ABNORMAL LOW (ref 39.0–52.0)
Hemoglobin: 11.2 g/dL — ABNORMAL LOW (ref 13.0–17.0)
Potassium: 3.8 mEq/L (ref 3.7–5.3)
Sodium: 142 mEq/L (ref 137–147)

## 2013-05-02 LAB — PROTIME-INR
INR: 1.66 — ABNORMAL HIGH (ref 0.00–1.49)
Prothrombin Time: 19.1 seconds — ABNORMAL HIGH (ref 11.6–15.2)

## 2013-05-02 SURGERY — ARTHROPLASTY, KNEE, TOTAL
Anesthesia: General | Site: Knee | Laterality: Right

## 2013-05-02 MED ORDER — DEXAMETHASONE SODIUM PHOSPHATE 10 MG/ML IJ SOLN: 10.0000 mg | Freq: Once | INTRAMUSCULAR | Status: DC

## 2013-05-02 MED ORDER — PHENOL 1.4 % MT LIQD: 1.0000 | OROMUCOSAL | Status: DC | PRN

## 2013-05-02 MED ORDER — MUPIROCIN 2 % EX OINT
TOPICAL_OINTMENT | Freq: Two times a day (BID) | CUTANEOUS | Status: DC
  Administered 2013-05-02: 06:00:00 via NASAL
  Filled 2013-05-02: qty 22

## 2013-05-02 MED ORDER — OXYCODONE HCL 5 MG/5ML PO SOLN
5.0000 mg | Freq: Once | ORAL | Status: DC | PRN
  Filled 2013-05-02: qty 5

## 2013-05-02 MED ORDER — SODIUM CHLORIDE 0.9 % IV SOLN
INTRAVENOUS | Status: DC | PRN
  Administered 2013-05-02: 14 mL via INTRAMUSCULAR

## 2013-05-02 MED ORDER — MENTHOL 3 MG MT LOZG: 1.0000 | LOZENGE | OROMUCOSAL | Status: DC | PRN

## 2013-05-02 MED ORDER — FLEET ENEMA 7-19 GM/118ML RE ENEM: 1.0000 | ENEMA | Freq: Once | RECTAL | Status: AC | PRN

## 2013-05-02 MED ORDER — ONDANSETRON HCL 4 MG/2ML IJ SOLN
INTRAMUSCULAR | Status: DC | PRN
  Administered 2013-05-02: 4 mg via INTRAVENOUS

## 2013-05-02 MED ORDER — HYDROCODONE-ACETAMINOPHEN 7.5-325 MG PO TABS
1.0000 | ORAL_TABLET | ORAL | Status: DC
  Administered 2013-05-02 – 2013-05-03 (×4): 2 via ORAL
  Filled 2013-05-02 (×2): qty 2
  Filled 2013-05-02: qty 1
  Filled 2013-05-02 (×3): qty 2

## 2013-05-02 MED ORDER — OXYCODONE HCL 5 MG PO TABS: 5.0000 mg | ORAL_TABLET | Freq: Once | ORAL | Status: DC | PRN

## 2013-05-02 MED ORDER — FENTANYL CITRATE 0.05 MG/ML IJ SOLN
INTRAMUSCULAR | Status: AC
  Filled 2013-05-02: qty 5

## 2013-05-02 MED ORDER — CISATRACURIUM BESYLATE (PF) 10 MG/5ML IV SOLN
INTRAVENOUS | Status: DC | PRN
  Administered 2013-05-02: 6 mg via INTRAVENOUS

## 2013-05-02 MED ORDER — CISATRACURIUM BESYLATE 20 MG/10ML IV SOLN
INTRAVENOUS | Status: AC
  Filled 2013-05-02: qty 10

## 2013-05-02 MED ORDER — ONDANSETRON HCL 4 MG/2ML IJ SOLN
INTRAMUSCULAR | Status: AC
  Filled 2013-05-02: qty 2

## 2013-05-02 MED ORDER — SODIUM CHLORIDE 0.9 % IR SOLN
Status: DC | PRN
  Administered 2013-05-02: 1000 mL

## 2013-05-02 MED ORDER — NEOSTIGMINE METHYLSULFATE 1 MG/ML IJ SOLN
INTRAMUSCULAR | Status: AC
  Filled 2013-05-02: qty 10

## 2013-05-02 MED ORDER — LIDOCAINE HCL (CARDIAC) 20 MG/ML IV SOLN
INTRAVENOUS | Status: DC | PRN
Start: 2013-05-02 — End: 2013-05-02
  Administered 2013-05-02: 50 mg via INTRAVENOUS

## 2013-05-02 MED ORDER — PROPOFOL 10 MG/ML IV BOLUS
INTRAVENOUS | Status: AC
  Filled 2013-05-02: qty 20

## 2013-05-02 MED ORDER — CEFAZOLIN SODIUM-DEXTROSE 2-3 GM-% IV SOLR
INTRAVENOUS | Status: AC
  Filled 2013-05-02: qty 50

## 2013-05-02 MED ORDER — SODIUM CHLORIDE 0.9 % IV SOLN
INTRAVENOUS | Status: DC
  Administered 2013-05-02 – 2013-05-03 (×2): via INTRAVENOUS
  Filled 2013-05-02 (×4): qty 1000

## 2013-05-02 MED ORDER — FENTANYL CITRATE 0.05 MG/ML IJ SOLN
INTRAMUSCULAR | Status: DC | PRN
  Administered 2013-05-02: 100 ug via INTRAVENOUS
  Administered 2013-05-02: 175 ug via INTRAVENOUS
  Administered 2013-05-02: 100 ug via INTRAVENOUS
  Administered 2013-05-02: 50 ug via INTRAVENOUS
  Administered 2013-05-02: 25 ug via INTRAVENOUS
  Administered 2013-05-02: 50 ug via INTRAVENOUS

## 2013-05-02 MED ORDER — 0.9 % SODIUM CHLORIDE (POUR BTL) OPTIME
TOPICAL | Status: DC | PRN
  Administered 2013-05-02: 1000 mL

## 2013-05-02 MED ORDER — DEXAMETHASONE SODIUM PHOSPHATE 10 MG/ML IJ SOLN
INTRAMUSCULAR | Status: DC | PRN
  Administered 2013-05-02: 10 mg via INTRAVENOUS

## 2013-05-02 MED ORDER — BUPIVACAINE-EPINEPHRINE (PF) 0.25% -1:200000 IJ SOLN
INTRAMUSCULAR | Status: DC | PRN
  Administered 2013-05-02: 25 mL

## 2013-05-02 MED ORDER — CEFAZOLIN SODIUM-DEXTROSE 2-3 GM-% IV SOLR
2.0000 g | Freq: Four times a day (QID) | INTRAVENOUS | Status: AC
  Administered 2013-05-02 (×2): 2 g via INTRAVENOUS
  Filled 2013-05-02 (×2): qty 50

## 2013-05-02 MED ORDER — BUPIVACAINE LIPOSOME 1.3 % IJ SUSP
20.0000 mL | Freq: Once | INTRAMUSCULAR | Status: AC
  Administered 2013-05-02: 20 mL
  Filled 2013-05-02: qty 20

## 2013-05-02 MED ORDER — POLYETHYLENE GLYCOL 3350 17 G PO PACK
17.0000 g | PACK | Freq: Two times a day (BID) | ORAL | Status: DC
  Administered 2013-05-02 – 2013-05-03 (×2): 17 g via ORAL

## 2013-05-02 MED ORDER — HYDROMORPHONE HCL PF 1 MG/ML IJ SOLN
INTRAMUSCULAR | Status: AC
  Filled 2013-05-02: qty 1

## 2013-05-02 MED ORDER — BISACODYL 10 MG RE SUPP: 10.0000 mg | Freq: Every day | RECTAL | Status: DC | PRN

## 2013-05-02 MED ORDER — NADOLOL 20 MG PO TABS
20.0000 mg | ORAL_TABLET | Freq: Every day | ORAL | Status: DC
  Administered 2013-05-02: 20 mg via ORAL
  Filled 2013-05-02 (×2): qty 1

## 2013-05-02 MED ORDER — CEFAZOLIN SODIUM-DEXTROSE 2-3 GM-% IV SOLR
2.0000 g | INTRAVENOUS | Status: AC
  Administered 2013-05-02: 2 g via INTRAVENOUS

## 2013-05-02 MED ORDER — SODIUM CHLORIDE 0.9 % IJ SOLN
INTRAMUSCULAR | Status: AC
  Filled 2013-05-02: qty 50

## 2013-05-02 MED ORDER — DIPHENHYDRAMINE HCL 25 MG PO CAPS: 25.0000 mg | ORAL_CAPSULE | Freq: Four times a day (QID) | ORAL | Status: DC | PRN

## 2013-05-02 MED ORDER — FERROUS SULFATE 325 (65 FE) MG PO TABS
325.0000 mg | ORAL_TABLET | Freq: Three times a day (TID) | ORAL | Status: DC
  Administered 2013-05-03 (×2): 325 mg via ORAL
  Filled 2013-05-02 (×5): qty 1

## 2013-05-02 MED ORDER — NEOSTIGMINE METHYLSULFATE 1 MG/ML IJ SOLN
INTRAMUSCULAR | Status: DC | PRN
  Administered 2013-05-02: 3 mg via INTRAVENOUS

## 2013-05-02 MED ORDER — SUCCINYLCHOLINE CHLORIDE 20 MG/ML IJ SOLN
INTRAMUSCULAR | Status: DC | PRN
  Administered 2013-05-02: 100 mg via INTRAVENOUS

## 2013-05-02 MED ORDER — ASPIRIN EC 325 MG PO TBEC
325.0000 mg | DELAYED_RELEASE_TABLET | Freq: Two times a day (BID) | ORAL | Status: DC
  Administered 2013-05-03: 325 mg via ORAL
  Filled 2013-05-02 (×3): qty 1

## 2013-05-02 MED ORDER — MIDAZOLAM HCL 2 MG/2ML IJ SOLN
INTRAMUSCULAR | Status: AC
  Filled 2013-05-02: qty 2

## 2013-05-02 MED ORDER — METOCLOPRAMIDE HCL 10 MG PO TABS: 5.0000 mg | ORAL_TABLET | Freq: Three times a day (TID) | ORAL | Status: DC | PRN

## 2013-05-02 MED ORDER — ONDANSETRON HCL 4 MG PO TABS: 4.0000 mg | ORAL_TABLET | Freq: Four times a day (QID) | ORAL | Status: DC | PRN

## 2013-05-02 MED ORDER — MIDAZOLAM HCL 5 MG/5ML IJ SOLN
INTRAMUSCULAR | Status: DC | PRN
  Administered 2013-05-02: 1 mg via INTRAVENOUS

## 2013-05-02 MED ORDER — DOCUSATE SODIUM 100 MG PO CAPS
100.0000 mg | ORAL_CAPSULE | Freq: Two times a day (BID) | ORAL | Status: DC
  Administered 2013-05-02 – 2013-05-03 (×2): 100 mg via ORAL

## 2013-05-02 MED ORDER — LACTATED RINGERS IV SOLN
INTRAVENOUS | Status: DC | PRN
  Administered 2013-05-02 (×2): via INTRAVENOUS

## 2013-05-02 MED ORDER — ROPIVACAINE HCL 5 MG/ML IJ SOLN
INTRAMUSCULAR | Status: AC
  Filled 2013-05-02: qty 30

## 2013-05-02 MED ORDER — METHOCARBAMOL 100 MG/ML IJ SOLN
500.0000 mg | Freq: Four times a day (QID) | INTRAVENOUS | Status: DC | PRN
  Administered 2013-05-02: 500 mg via INTRAVENOUS
  Filled 2013-05-02: qty 5

## 2013-05-02 MED ORDER — METOCLOPRAMIDE HCL 5 MG/ML IJ SOLN: 5.0000 mg | Freq: Three times a day (TID) | INTRAMUSCULAR | Status: DC | PRN

## 2013-05-02 MED ORDER — ALUM & MAG HYDROXIDE-SIMETH 200-200-20 MG/5ML PO SUSP: 30.0000 mL | ORAL | Status: DC | PRN

## 2013-05-02 MED ORDER — FUROSEMIDE 10 MG/ML IJ SOLN
20.0000 mg | INTRAMUSCULAR | Status: AC
  Administered 2013-05-02: 20 mg via INTRAVENOUS

## 2013-05-02 MED ORDER — KETOROLAC TROMETHAMINE 30 MG/ML IJ SOLN
INTRAMUSCULAR | Status: AC
  Filled 2013-05-02: qty 1

## 2013-05-02 MED ORDER — SPIRITUS FRUMENTI
1.0000 | Freq: Every day | ORAL | Status: DC
  Filled 2013-05-02 (×2): qty 1

## 2013-05-02 MED ORDER — ONDANSETRON HCL 4 MG/2ML IJ SOLN: 4.0000 mg | Freq: Four times a day (QID) | INTRAMUSCULAR | Status: DC | PRN

## 2013-05-02 MED ORDER — EPHEDRINE SULFATE 50 MG/ML IJ SOLN
INTRAMUSCULAR | Status: AC
  Filled 2013-05-02: qty 1

## 2013-05-02 MED ORDER — GLYCOPYRROLATE 0.2 MG/ML IJ SOLN
INTRAMUSCULAR | Status: AC
  Filled 2013-05-02: qty 1

## 2013-05-02 MED ORDER — LIDOCAINE HCL (CARDIAC) 20 MG/ML IV SOLN
INTRAVENOUS | Status: AC
  Filled 2013-05-02: qty 5

## 2013-05-02 MED ORDER — PROPOFOL 10 MG/ML IV BOLUS
INTRAVENOUS | Status: DC | PRN
  Administered 2013-05-02: 175 mg via INTRAVENOUS
  Administered 2013-05-02: 25 mg via INTRAVENOUS

## 2013-05-02 MED ORDER — HYDROMORPHONE HCL PF 1 MG/ML IJ SOLN: 0.5000 mg | INTRAMUSCULAR | Status: DC | PRN

## 2013-05-02 MED ORDER — GLYCOPYRROLATE 0.2 MG/ML IJ SOLN
INTRAMUSCULAR | Status: DC | PRN
  Administered 2013-05-02: .3 mg via INTRAVENOUS

## 2013-05-02 MED ORDER — FUROSEMIDE 20 MG PO TABS
20.0000 mg | ORAL_TABLET | Freq: Every day | ORAL | Status: DC
  Administered 2013-05-02: 20 mg via ORAL
  Filled 2013-05-02 (×2): qty 1

## 2013-05-02 MED ORDER — ZOLPIDEM TARTRATE 5 MG PO TABS: 5.0000 mg | ORAL_TABLET | Freq: Every evening | ORAL | Status: DC | PRN

## 2013-05-02 MED ORDER — MEPERIDINE HCL 50 MG/ML IJ SOLN: 6.2500 mg | INTRAMUSCULAR | Status: DC | PRN

## 2013-05-02 MED ORDER — BUPIVACAINE-EPINEPHRINE PF 0.25-1:200000 % IJ SOLN
INTRAMUSCULAR | Status: AC
  Filled 2013-05-02: qty 30

## 2013-05-02 MED ORDER — KETOROLAC TROMETHAMINE 30 MG/ML IJ SOLN
INTRAMUSCULAR | Status: DC | PRN
  Administered 2013-05-02: 30 mg

## 2013-05-02 MED ORDER — CELECOXIB 200 MG PO CAPS
200.0000 mg | ORAL_CAPSULE | Freq: Two times a day (BID) | ORAL | Status: DC
  Administered 2013-05-02 – 2013-05-03 (×2): 200 mg via ORAL
  Filled 2013-05-02 (×4): qty 1

## 2013-05-02 MED ORDER — SPIRONOLACTONE 50 MG PO TABS
50.0000 mg | ORAL_TABLET | Freq: Every day | ORAL | Status: DC
  Administered 2013-05-02: 50 mg via ORAL
  Filled 2013-05-02 (×2): qty 1

## 2013-05-02 MED ORDER — HYDROMORPHONE HCL PF 1 MG/ML IJ SOLN
0.2500 mg | INTRAMUSCULAR | Status: DC | PRN
  Administered 2013-05-02 (×2): 0.5 mg via INTRAVENOUS

## 2013-05-02 MED ORDER — METHOCARBAMOL 500 MG PO TABS: 500.0000 mg | ORAL_TABLET | Freq: Four times a day (QID) | ORAL | Status: DC | PRN

## 2013-05-02 MED ORDER — DEXAMETHASONE SODIUM PHOSPHATE 10 MG/ML IJ SOLN
10.0000 mg | Freq: Once | INTRAMUSCULAR | Status: AC
  Administered 2013-05-03: 10 mg via INTRAVENOUS
  Filled 2013-05-02: qty 1

## 2013-05-02 MED ORDER — PROMETHAZINE HCL 25 MG/ML IJ SOLN: 6.2500 mg | INTRAMUSCULAR | Status: DC | PRN

## 2013-05-02 SURGICAL SUPPLY — 57 items
ADH SKN CLS APL DERMABOND .7 (GAUZE/BANDAGES/DRESSINGS) ×1
BAG SPEC THK2 15X12 ZIP CLS (MISCELLANEOUS) ×1
BAG ZIPLOCK 12X15 (MISCELLANEOUS) ×2 IMPLANT
BANDAGE ELASTIC 6 VELCRO ST LF (GAUZE/BANDAGES/DRESSINGS) ×2 IMPLANT
BANDAGE ESMARK 6X9 LF (GAUZE/BANDAGES/DRESSINGS) ×1 IMPLANT
BLADE SAW SGTL 13.0X1.19X90.0M (BLADE) ×2 IMPLANT
BNDG CMPR 9X6 STRL LF SNTH (GAUZE/BANDAGES/DRESSINGS) ×1
BNDG ESMARK 6X9 LF (GAUZE/BANDAGES/DRESSINGS) ×2
BOWL SMART MIX CTS (DISPOSABLE) ×2 IMPLANT
CAPT RP KNEE ×2 IMPLANT
CEMENT HV SMART SET (Cement) ×4 IMPLANT
CUFF TOURN SGL QUICK 34 (TOURNIQUET CUFF) ×2
CUFF TRNQT CYL 34X4X40X1 (TOURNIQUET CUFF) ×1 IMPLANT
DECANTER SPIKE VIAL GLASS SM (MISCELLANEOUS) ×2 IMPLANT
DERMABOND ADVANCED (GAUZE/BANDAGES/DRESSINGS) ×1
DERMABOND ADVANCED .7 DNX12 (GAUZE/BANDAGES/DRESSINGS) ×1 IMPLANT
DRAPE EXTREMITY T 121X128X90 (DRAPE) ×2 IMPLANT
DRAPE POUCH INSTRU U-SHP 10X18 (DRAPES) ×2 IMPLANT
DRAPE U-SHAPE 47X51 STRL (DRAPES) ×2 IMPLANT
DRSG AQUACEL AG ADV 3.5X10 (GAUZE/BANDAGES/DRESSINGS) ×2 IMPLANT
DRSG TEGADERM 4X4.75 (GAUZE/BANDAGES/DRESSINGS) IMPLANT
DURAPREP 26ML APPLICATOR (WOUND CARE) ×4 IMPLANT
ELECT REM PT RETURN 9FT ADLT (ELECTROSURGICAL) ×2
ELECTRODE REM PT RTRN 9FT ADLT (ELECTROSURGICAL) ×1 IMPLANT
EVACUATOR 1/8 PVC DRAIN (DRAIN) IMPLANT
FACESHIELD LNG OPTICON STERILE (SAFETY) ×10 IMPLANT
GAUZE SPONGE 2X2 8PLY STRL LF (GAUZE/BANDAGES/DRESSINGS) IMPLANT
GLOVE BIOGEL PI IND STRL 7.5 (GLOVE) ×1 IMPLANT
GLOVE BIOGEL PI IND STRL 8 (GLOVE) ×1 IMPLANT
GLOVE BIOGEL PI INDICATOR 7.5 (GLOVE) ×1
GLOVE BIOGEL PI INDICATOR 8 (GLOVE) ×1
GLOVE ECLIPSE 8.0 STRL XLNG CF (GLOVE) ×2 IMPLANT
GLOVE ORTHO TXT STRL SZ7.5 (GLOVE) ×4 IMPLANT
GOWN BRE IMP PREV XXLGXLNG (GOWN DISPOSABLE) ×2 IMPLANT
GOWN PREVENTION PLUS LG XLONG (DISPOSABLE) ×2 IMPLANT
HANDPIECE INTERPULSE COAX TIP (DISPOSABLE) ×2
KIT BASIN OR (CUSTOM PROCEDURE TRAY) ×2 IMPLANT
MANIFOLD NEPTUNE II (INSTRUMENTS) ×2 IMPLANT
NDL SAFETY ECLIPSE 18X1.5 (NEEDLE) ×1 IMPLANT
NEEDLE HYPO 18GX1.5 SHARP (NEEDLE) ×2
NS IRRIG 1000ML POUR BTL (IV SOLUTION) ×2 IMPLANT
PACK TOTAL JOINT (CUSTOM PROCEDURE TRAY) ×2 IMPLANT
POSITIONER SURGICAL ARM (MISCELLANEOUS) ×2 IMPLANT
SET HNDPC FAN SPRY TIP SCT (DISPOSABLE) ×1 IMPLANT
SET PAD KNEE POSITIONER (MISCELLANEOUS) ×2 IMPLANT
SPONGE GAUZE 2X2 STER 10/PKG (GAUZE/BANDAGES/DRESSINGS)
SUCTION FRAZIER 12FR DISP (SUCTIONS) ×2 IMPLANT
SUT MNCRL AB 4-0 PS2 18 (SUTURE) ×2 IMPLANT
SUT VIC AB 1 CT1 36 (SUTURE) ×2 IMPLANT
SUT VIC AB 2-0 CT1 27 (SUTURE) ×6
SUT VIC AB 2-0 CT1 TAPERPNT 27 (SUTURE) ×3 IMPLANT
SUT VLOC 180 0 24IN GS25 (SUTURE) ×2 IMPLANT
SYR 50ML LL SCALE MARK (SYRINGE) ×2 IMPLANT
TOWEL OR 17X26 10 PK STRL BLUE (TOWEL DISPOSABLE) ×4 IMPLANT
TRAY FOLEY CATH 16FR SILVER (SET/KITS/TRAYS/PACK) ×2 IMPLANT
WATER STERILE IRR 1500ML POUR (IV SOLUTION) ×2 IMPLANT
WRAP KNEE MAXI GEL POST OP (GAUZE/BANDAGES/DRESSINGS) ×2 IMPLANT

## 2013-05-02 NOTE — Progress Notes (Signed)
Pt stated he attempted to pick up bactroban prescription at pharmacy however it was never called in.  Bactroban started am of surgery in short stay.  Teaching done with pt and wife.  Written education given to wife.  Pt and wife verbalized understanding.

## 2013-05-02 NOTE — Interval H&P Note (Signed)
History and Physical Interval Note:  05/02/2013 7:29 AM  Mark [name]  has presented today for surgery, with the diagnosis of Right Knee Post Tramatic Osteoarthritis  The various methods of treatment have been discussed with the patient and family. After consideration of risks, benefits and other options for treatment, the patient has consented to  Procedure(s): RIGHT TOTAL KNEE ARTHROPLASTY (Right) as a surgical intervention .  The patient's history has been reviewed, patient examined, no change in status, stable for surgery.  I have reviewed the patient's chart and labs.  Questions were answered to the patient's satisfaction.     Mauri Pole

## 2013-05-02 NOTE — Anesthesia Preprocedure Evaluation (Addendum)
Anesthesia Evaluation  Patient identified by MRN, date of birth, ID band Patient awake    Reviewed: Allergy & Precautions, H&P , NPO status , Patient's Chart, lab work & pertinent test results  Airway Mallampati: II TM Distance: >3 FB Neck ROM: Full    Dental  (+) Dental Advisory Given and Teeth Intact   Pulmonary neg pulmonary ROS, Current Smoker,  breath sounds clear to auscultation        Cardiovascular hypertension, Pt. on medications + Past MI negative cardio ROS  + Valvular Problems/Murmurs Rhythm:Regular Rate:Normal     Neuro/Psych CVA negative psych ROS   GI/Hepatic negative GI ROS, Neg liver ROS,   Endo/Other  negative endocrine ROS  Renal/GU negative Renal ROS     Musculoskeletal negative musculoskeletal ROS (+)   Abdominal   Peds  Hematology  (+) anemia ,   Anesthesia Other Findings   Reproductive/Obstetrics negative OB ROS                          Anesthesia Physical Anesthesia Plan  ASA: III  Anesthesia Plan: General   Post-op Pain Management:    Induction: Intravenous  Airway Management Planned: Oral ETT  Additional Equipment:   Intra-op Plan:   Post-operative Plan: Extubation in OR  Informed Consent: I have reviewed the patients History and Physical, chart, labs and discussed the procedure including the risks, benefits and alternatives for the proposed anesthesia with the patient or authorized representative who has indicated his/her understanding and acceptance.   Dental advisory given  Plan Discussed with: CRNA  Anesthesia Plan Comments:        Anesthesia Quick Evaluation

## 2013-05-02 NOTE — Transfer of Care (Signed)
Immediate Anesthesia Transfer of Care Note  Patient: Mark [name]  Procedure(s) Performed: Procedure(s): RIGHT TOTAL KNEE ARTHROPLASTY (Right)  Patient Location: PACU  Anesthesia Type:General  Level of Consciousness: awake, oriented, patient cooperative, lethargic and responds to stimulation  Airway & Oxygen Therapy: Patient Spontanous Breathing and Patient connected to face mask oxygen  Post-op Assessment: Report given to PACU RN, Post -op Vital signs reviewed and stable and Patient moving all extremities  Post vital signs: Reviewed and stable  Complications: No apparent anesthesia complications 

## 2013-05-02 NOTE — Transfer of Care (Signed)
Immediate Anesthesia Transfer of Care Note  Patient: Mark [name]  Procedure(s) Performed: Procedure(s): RIGHT TOTAL KNEE ARTHROPLASTY (Right)  Patient Location: PACU  Anesthesia Type:General  Level of Consciousness: awake, oriented, patient cooperative, lethargic and responds to stimulation  Airway & Oxygen Therapy: Patient Spontanous Breathing and Patient connected to face mask oxygen  Post-op Assessment: Report given to PACU RN, Post -op Vital signs reviewed and stable and Patient moving all extremities  Post vital signs: Reviewed and stable  Complications: No apparent anesthesia complications

## 2013-05-02 NOTE — Op Note (Signed)
NAME:  Mark [name]                      MEDICAL RECORD NO.:  [id number]                             FACILITY:  [facility]      PHYSICIAN:  Pietro Cassis. Alvan Dame, M.D.  DATE OF BIRTH:  1955-04-29      DATE OF PROCEDURE:  05/02/2013                                     OPERATIVE REPORT         PREOPERATIVE DIAGNOSIS:  Right knee osteoarthritis.      POSTOPERATIVE DIAGNOSIS:  Right knee osteoarthritis.      FINDINGS:  The patient was noted to have complete loss of cartilage and   bone-on-bone arthritis with associated osteophytes in predominantly the medial compartment of   the knee with a significant synovitis and associated effusion.      PROCEDURE:  Right total knee replacement.      COMPONENTS USED:  DePuy rotating platform posterior stabilized knee   system, a size 4 femur, 4 tibia, 10 mm PS insert, and 41 patellar   button.      SURGEON:  Pietro Cassis. Alvan Dame, M.D.      ASSISTANT:  Danae Orleans, PA-C.      ANESTHESIA:  General and Regional.      SPECIMENS:  None.      COMPLICATION:  None.      DRAINS:  None.  EBL: <100cc      TOURNIQUET TIME:   Total Tourniquet Time Documented: Thigh (Right) - 34 minutes Total: Thigh (Right) - 34 minutes  .      The patient was stable to the recovery room.      INDICATION FOR PROCEDURE:  Mark [name] is a 58 y.o. male patient of   mine.  The patient had been seen, evaluated, and treated conservatively in the   office with medication, activity modification, and injections.  The patient had   radiographic changes of bone-on-bone arthritis with endplate sclerosis and osteophytes noted.      The patient failed conservative measures including medication, injections, and activity modification, and at this point was ready for more definitive measures.   Based on the radiographic changes and failed conservative measures, the patient   decided to proceed with total knee replacement.  Risks of infection,   DVT, component failure, need for  revision surgery, postop course, and   expectations were all   discussed and reviewed.  Consent was obtained for benefit of pain   relief.      PROCEDURE IN DETAIL:  The patient was brought to the operative theater.   Once adequate anesthesia, preoperative antibiotics, 2 gm of Ancef administered, the patient was positioned supine with the right thigh tourniquet placed.  The  right lower extremity was prepped and draped in sterile fashion.  A time-   out was performed identifying the patient, planned procedure, and   extremity.      The right lower extremity was placed in the [facility] leg holder.  The leg was   exsanguinated, tourniquet elevated to 250 mmHg.  A midline incision was   made followed by median parapatellar arthrotomy.  Following initial   exposure, attention was  first directed to the patella.  Precut   measurement was noted to be 25 mm.  I resected down to 14 mm and used a   41 patellar button to restore patellar height as well as cover the cut   surface.      The lug holes were drilled and a metal shim was placed to protect the   patella from retractors and saw blades.      At this point, attention was now directed to the femur.  The femoral   canal was opened with a drill, irrigated to try to prevent fat emboli.  An   intramedullary rod was passed at 5 degrees valgus, 10 mm of bone was   resected off the distal femur.  Following this resection, the tibia was   subluxated anteriorly.  Using the extramedullary guide, 10 mm of bone was resected off   the proximal lateral tibia.  We confirmed the gap would be   stable medially and laterally with a 10 mm insert as well as confirmed   the cut was perpendicular in the coronal plane, checking with an alignment rod.      Once this was done, I sized the femur to be a size 4 in the anterior-   posterior dimension, chose a standard component based on medial and   lateral dimension.  The size 4 rotation block was then pinned in    position anterior referenced using the C-clamp to set rotation.  The   anterior, posterior, and  chamfer cuts were made without difficulty nor   notching making certain that I was along the anterior cortex to help   with flexion gap stability.      The final box cut was made off the lateral aspect of distal femur.      At this point, the tibia was sized to be a size 4, the size 4 tray was   then pinned in position through the medial third of the tubercle,   drilled, and keel punched.  Trial reduction was now carried with a 4 femur,  4 tibia, a 10 mm insert, and the 41 patella botton.  The knee was brought to   extension, full extension with good flexion stability with the patella   tracking through the trochlea without application of pressure.  Given   all these findings, the trial components removed.  Final components were   opened and cement was mixed.  The knee was irrigated with normal saline   solution and pulse lavage.  The synovial lining was   then injected with 20cc of Exparel, 30cc of 0.25% Marcaine with epinephrine and 1 cc of Toradol,   total of 61 cc.      The knee was irrigated.  Final implants were then cemented onto clean and   dried cut surfaces of bone with the knee brought to extension with a 10 mm trial insert.      Once the cement had fully cured, the excess cement was removed   throughout the knee.  I confirmed I was satisfied with the range of   motion and stability, and the final 10 mm PS insert was chosen.  It was   placed into the knee.      The tourniquet had been let down at 33 minutes.  No significant   hemostasis required.  The   extensor mechanism was then reapproximated using #1 Vicryl and #0 V-lock with the knee   in flexion.  The  remaining wound was closed with 2-0 Vicryl and running 4-0 Monocryl.   The knee was cleaned, dried, dressed sterilely using Dermabond and   Aquacel dressing. The patient was then   brought to recovery room in stable  condition, tolerating the procedure   well.   Please note that Physician Assistant, Danae Orleans, PA-C, was present for the entirety of the case, and was utilized for pre-operative positioning, peri-operative retractor management, general facilitation of the procedure.  He was also utilized for primary wound closure at the end of the case.              Pietro Cassis Alvan Dame, M.D.    05/02/2013 9:29 AM

## 2013-05-02 NOTE — Anesthesia Procedure Notes (Addendum)
Anesthesia Regional Block:  Femoral nerve block  Pre-Anesthetic Checklist: ,, timeout performed, Correct Patient, Correct Site, Correct Laterality, Correct Procedure, Correct Position, site marked, Risks and benefits discussed,  Surgical consent,  Pre-op evaluation,  At surgeon's request and post-op pain management  Laterality: Right  Prep: chloraprep       Needles:  Injection technique: Single-shot  Needle Type: Stimulator Needle - 80      Needle Gauge: 22 and 22 G  Needle insertion depth: 6 cm   Additional Needles:  Procedures: ultrasound guided (picture in chart) and nerve stimulator Femoral nerve block  Nerve Stimulator or Paresthesia:  Response: Twitch elicited, petellar snap, 0.5 mA,   Additional Responses:   Narrative:  Start time: 05/02/2013 7:50 AM End time: 05/02/2013 7:55 AM Injection made incrementally with aspirations every 5 mL.  Performed by: Personally  Anesthesiologist: Lissa Hoard, MD  Additional Notes: BP cuff, EKG monitors applied. Sedation begun. Femoral artery palpated for location of nerve. After nerve location anesthetic injected incrementally, slowly , and after neg aspirations with direct U/S guidance. Tolerated well.   Procedure Name: Intubation Date/Time: 05/02/2013 8:10 AM Performed by: Ofilia Neas Pre-anesthesia Checklist: Patient identified, Patient being monitored, Timeout performed, Emergency Drugs available and Suction available Patient Re-evaluated:Patient Re-evaluated prior to inductionOxygen Delivery Method: Circle system utilized Preoxygenation: Pre-oxygenation with 100% oxygen Intubation Type: IV induction and Cricoid Pressure applied Ventilation: Mask ventilation without difficulty Laryngoscope Size: 4 and Mac Grade View: Grade II Tube type: Oral Tube size: 7.5 mm Number of attempts: 1 Airway Equipment and Method: Stylet Placement Confirmation: ETT inserted through vocal cords under direct vision,  positive ETCO2 and breath  sounds checked- equal and bilateral Secured at: 21 cm Tube secured with: Tape Dental Injury: Teeth and Oropharynx as per pre-operative assessment

## 2013-05-02 NOTE — Anesthesia Postprocedure Evaluation (Signed)
Anesthesia Post Note  Patient: Mark [name]  Procedure(s) Performed: Procedure(s) (LRB): RIGHT TOTAL KNEE ARTHROPLASTY (Right)  Anesthesia type: General  Patient location: PACU  Post pain: Pain level controlled  Post assessment: Post-op Vital signs reviewed  Last Vitals: BP 143/65  Pulse 62  Temp(Src) 36.3 C (Axillary)  Resp 16  SpO2 100%  Post vital signs: Reviewed  Level of consciousness: sedated  Complications: No apparent anesthesia complications

## 2013-05-02 NOTE — Plan of Care (Signed)
Problem: Consults Goal: Diagnosis- Total Joint Replacement Outcome: Completed/Met Date Met:  05/02/13 Primary Total Knee     

## 2013-05-02 NOTE — Preoperative (Addendum)
Beta Blockers   Reason not to administer Beta Blockers:Not Applicable 

## 2013-05-02 NOTE — Progress Notes (Signed)
PT Cancellation Note  Patient Details Name: Mark [name] MRN: [id number] DOB: 1956/01/25   Cancelled Treatment:    Reason Eval/Treat Not Completed:  (RN suggested to wait until the next AM.)   Claretha Cooper 05/02/2013, 6:34 PM Tresa Endo PT [phone]

## 2013-05-03 ENCOUNTER — Encounter (HOSPITAL_COMMUNITY): Payer: Self-pay | Admitting: Cardiovascular Disease

## 2013-05-03 DIAGNOSIS — D500 Iron deficiency anemia secondary to blood loss (chronic): Secondary | ICD-10-CM | POA: Diagnosis not present

## 2013-05-03 LAB — HEPATIC FUNCTION PANEL
ALT: 15 U/L (ref 0–53)
AST: 44 U/L — ABNORMAL HIGH (ref 0–37)
Albumin: 1.5 g/dL — ABNORMAL LOW (ref 3.5–5.2)
Alkaline Phosphatase: 101 U/L (ref 39–117)
Bilirubin, Direct: 0.6 mg/dL — ABNORMAL HIGH (ref 0.0–0.3)
Indirect Bilirubin: 0.6 mg/dL (ref 0.3–0.9)
Total Bilirubin: 1.2 mg/dL (ref 0.3–1.2)
Total Protein: 5.3 g/dL — ABNORMAL LOW (ref 6.0–8.3)

## 2013-05-03 LAB — BASIC METABOLIC PANEL
BUN: 16 mg/dL (ref 6–23)
CO2: 21 mEq/L (ref 19–32)
Calcium: 7.5 mg/dL — ABNORMAL LOW (ref 8.4–10.5)
Chloride: 107 mEq/L (ref 96–112)
Creatinine, Ser: 1.63 mg/dL — ABNORMAL HIGH (ref 0.50–1.35)
GFR calc Af Amer: 52 mL/min — ABNORMAL LOW (ref >90)
GFR calc non Af Amer: 45 mL/min — ABNORMAL LOW (ref >90)
Glucose, Bld: 110 mg/dL — ABNORMAL HIGH (ref 70–99)
Potassium: 4.1 mEq/L (ref 3.7–5.3)
Sodium: 137 mEq/L (ref 137–147)

## 2013-05-03 LAB — CBC
HCT: 23.6 % — ABNORMAL LOW (ref 39.0–52.0)
Hemoglobin: 8.6 g/dL — ABNORMAL LOW (ref 13.0–17.0)
MCH: 35.8 pg — ABNORMAL HIGH (ref 26.0–34.0)
MCHC: 36.4 g/dL — ABNORMAL HIGH (ref 30.0–36.0)
MCV: 98.3 fL (ref 78.0–100.0)
Platelets: 104 10*3/uL — ABNORMAL LOW (ref 150–400)
RBC: 2.4 MIL/uL — ABNORMAL LOW (ref 4.22–5.81)
RDW: 14.8 % (ref 11.5–15.5)
WBC: 10.1 10*3/uL (ref 4.0–10.5)

## 2013-05-03 MED ORDER — METHOCARBAMOL 500 MG PO TABS: 500.0000 mg | ORAL_TABLET | Freq: Four times a day (QID) | ORAL | Status: DC | PRN

## 2013-05-03 MED ORDER — POLYETHYLENE GLYCOL 3350 17 G PO PACK: 17.0000 g | PACK | Freq: Two times a day (BID) | ORAL | Status: DC

## 2013-05-03 MED ORDER — DSS 100 MG PO CAPS
100.0000 mg | ORAL_CAPSULE | Freq: Two times a day (BID) | ORAL | Status: DC
Start: 2013-05-03 — End: 2013-12-15

## 2013-05-03 MED ORDER — HYDROCODONE-ACETAMINOPHEN 7.5-325 MG PO TABS: 1.0000 | ORAL_TABLET | ORAL | Status: DC | PRN

## 2013-05-03 MED ORDER — ASPIRIN 325 MG PO TBEC: 325.0000 mg | DELAYED_RELEASE_TABLET | Freq: Two times a day (BID) | ORAL | Status: AC

## 2013-05-03 MED ORDER — FERROUS SULFATE 325 (65 FE) MG PO TABS: 325.0000 mg | ORAL_TABLET | Freq: Three times a day (TID) | ORAL | Status: DC

## 2013-05-03 NOTE — Evaluation (Signed)
Physical Therapy Evaluation Patient Details Name: Mark [name] MRN: [id number] DOB: [birth year] Today's Date: 05/03/2013 Time: 6213-0865 PT Time Calculation (min): 28 min  PT Assessment / Plan / Recommendation History of Present Illness  RTKA  Clinical Impression  Pt has a L knee extension lag and numbness in thigh. Pt unable to put full weight on R leg. Will obtain KI for use on stairs.. Pt will benefit from PT to improve safe ambulation .    PT Assessment  Patient needs continued PT services    Follow Up Recommendations  Home health PT    Does the patient have the potential to tolerate intense rehabilitation      Barriers to Discharge        Equipment Recommendations  None recommended by PT    Recommendations for Other Services     Frequency 7X/week    Precautions / Restrictions Precautions Precautions: Fall Precaution Comments: R thigh remains somewhat  numbish. Required Braces or Orthoses: Knee Immobilizer - Right (will use for safety due to numbness and quads remain weak.)   Pertinent Vitals/Pain No pain c/o. Pt sleepy.      Mobility  Bed Mobility Bed Mobility: Supine to Sit;Sitting - Scoot to Edge of Bed Supine to Sit: 4: Min assist Sitting - Scoot to Edge of Bed: 4: Min guard Details for Bed Mobility Assistance: R leg supported. Transfers Transfers: Sit to Stand Sit to Stand: 4: Min assist;From bed;With upper extremity assist Details for Transfer Assistance: cues for safety and fro hand placement. Ambulation/Gait Ambulation/Gait Assistance: 4: Min assist Ambulation Distance (Feet): 20 Feet Assistive device: Rolling walker Ambulation/Gait Assistance Details: cues for sequence. posture. Gait Pattern: Step-to pattern;Antalgic;Decreased step length - right    Exercises Total Joint Exercises Quad Sets: AROM;Both;10 reps;Supine Heel Slides: AAROM;Right;10 reps;Supine Hip ABduction/ADduction: AAROM;Right;10 reps;Supine Straight Leg Raises: AAROM;Right;10  reps;Supine   PT Diagnosis: Acute pain  PT Problem List: Decreased strength;Decreased range of motion;Decreased activity tolerance;Decreased mobility;Decreased knowledge of use of DME;Decreased safety awareness;Decreased knowledge of precautions;Pain PT Treatment Interventions: DME instruction;Gait training;Stair training;Therapeutic activities;Functional mobility training;Patient/family education     PT Goals(Current goals can be found in the care plan section) Acute Rehab PT Goals Patient Stated Goal: i will walk. PT Goal Formulation: With patient Time For Goal Achievement: 05/07/13 Potential to Achieve Goals: Good  Visit Information  Last PT Received On: 05/03/13 Assistance Needed: +1 History of Present Illness: RTKA       Prior [place] expects to be discharged to:: Private residence Living Arrangements: Spouse/significant other Available Help at Discharge: Family Type of Home: House Home Access: Stairs to enter Technical brewer of Steps: 2 Entrance Stairs-Rails: None Home Layout: One level Home Equipment: Environmental consultant - 2 wheels Prior Function Level of Independence: Independent Communication Communication: No difficulties    Cognition  Cognition Arousal/Alertness: Awake/alert Behavior During Therapy: WFL for tasks assessed/performed Overall Cognitive Status: Within Functional Limits for tasks assessed    Extremity/Trunk Assessment Upper Extremity Assessment Upper Extremity Assessment: Defer to OT evaluation Lower Extremity Assessment Lower Extremity Assessment: RLE deficits/detail RLE Deficits / Details: has a  lag in extension with SLR. Cervical / Trunk Assessment Cervical / Trunk Assessment: Normal   Balance    End of Session PT - End of Session Activity Tolerance: Patient tolerated treatment well Patient left:  (with OT) Nurse Communication: Mobility status  GP     Claretha Cooper 05/03/2013, 11:30 AM Tresa Endo  PT [phone]

## 2013-05-03 NOTE — Evaluation (Signed)
Occupational Therapy Evaluation Patient Details Name: Mark [name] MRN: [id number] DOB: 1956/02/22 Today's Date: 05/03/2013 Time: 3557-3220 OT Time Calculation (min): 29 min  OT Assessment / Plan / Recommendation History of present illness RTKA   Clinical Impression   Pt was admitted for the above. He requires min A for adls and ambulating to bathroom.  All education was completed.  Pt does not need any further OT at this time.      OT Assessment  Patient does not need any further OT services    Follow Up Recommendations  No OT follow up    Barriers to Discharge      Equipment Recommendations  3 in 1 bedside comode    Recommendations for Other Services    Frequency       Precautions / Restrictions Precautions Precautions: Fall Precaution Comments: did not use KI:  has tingling at calf Required Braces or Orthoses: Knee Immobilizer - Right (will use for safety due to numbness and quads remain weak.) Restrictions Weight Bearing Restrictions: No   Pertinent Vitals/Pain Back painful--not rated.  Repositioned in chair    ADL  Toilet Transfer: Minimal assistance Toilet Transfer Method: Sit to stand Toilet Transfer Equipment: Raised toilet seat with arms (or 3-in-1 over toilet) Equipment Used: Rolling walker Transfers/Ambulation Related to ADLs: ambulated to bathroom; sat on 3:1 over commode and rested.  Pt with dyspnea 2/4 with exertion.  Extra time to ambulate and cues for walker distance.  Overlapped with PT by 2 minutes.  ADL Comments: Pt is able to perform ub adls with set up and LB adls with min A.    OT Diagnosis:    OT Problem List:   OT Treatment Interventions:     OT Goals(Current goals can be found in the care plan section) Acute Rehab OT Goals Patient Stated Goal: i will walk.  Visit Information  Last OT Received On: 05/03/13 Assistance Needed: +1 History of Present Illness: RTKA       Prior [place] expects to be  discharged to:: Private residence Living Arrangements: Spouse/significant other Available Help at Discharge: Family Type of Home: House Home Access: Stairs to enter Technical brewer of Steps: 2 Entrance Stairs-Rails: None Home Layout: One level Amo: Environmental consultant - 2 wheels Prior Function Level of Independence: Independent Communication Communication: No difficulties         Vision/Perception     Cognition  Cognition Arousal/Alertness: Awake/alert Behavior During Therapy: WFL for tasks assessed/performed Overall Cognitive Status: Within Functional Limits for tasks assessed    Extremity/Trunk Assessment Upper Extremity Assessment Upper Extremity Assessment: Overall WFL for tasks assessed Lower Extremity Assessment Lower Extremity Assessment: RLE deficits/detail RLE Deficits / Details: has a  lag in extension with SLR. Cervical / Trunk Assessment Cervical / Trunk Assessment: Normal     Mobility Bed Mobility Bed Mobility: Supine to Sit;Sitting - Scoot to Edge of Bed Supine to Sit: 4: Min assist Sitting - Scoot to Edge of Bed: 4: Min guard Details for Bed Mobility Assistance: R leg supported. Transfers Sit to Stand: 4: Min assist;From bed;With upper extremity assist;From chair/3-in-1 Details for Transfer Assistance: cues for hand and leg placement     Exercise    Balance     End of Session OT - End of Session Activity Tolerance: Patient limited by fatigue Patient left: in chair;with call bell/phone within reach  [place] 05/03/2013, 12:21 PM [name], OTR/L [phone] 05/03/2013

## 2013-05-03 NOTE — Progress Notes (Signed)
Pt stable, scripts, d/c instructions and equipment given with no questions/concerns voiced by pt or family member.  Pt transported via wheelchair to private vehicle by NT and family.

## 2013-05-03 NOTE — Progress Notes (Signed)
   Subjective: 1 Day Post-Op Procedure(s) (LRB): RIGHT TOTAL KNEE ARTHROPLASTY (Right)   Patient reports pain as mild, states the pain is controlled with the medication. No events throughout the night. Hasn't been up with PT yet, but if he does well the plan would to be discharged home with HHPT.  Objective:   VITALS:   Filed Vitals:   05/03/13  BP: 109/56  Pulse: 64  Temp: 99.3 F (37.4 C)   Resp: 16    Neurovascular intact Dorsiflexion/Plantar flexion intact Incision: dressing C/D/I No cellulitis present Compartment soft  LABS  Recent Labs  05/02/13 0741 05/03/13 0505  HGB 11.2* 8.6*  HCT 33.0* 23.6*  WBC  --  10.1  PLT  --  104*     Recent Labs  05/02/13 0741 05/03/13 0505  NA 142 137  K 3.8 4.1  BUN  --  16  CREATININE  --  1.63*  GLUCOSE 85 110*     Assessment/Plan: 1 Day Post-Op Procedure(s) (LRB): RIGHT TOTAL KNEE ARTHROPLASTY (Right) Advance diet Up with therapy D/C IV fluids Discharge home with home health Follow up in 2 weeks at [facility]. Follow up with OLIN,Weslyn Holsonback D in 2 weeks.  Contact information:  [facility] [address], [place] [phone]    Expected ABLA  Treated with iron and will observe       West Pugh. Florie Carico   PAC  05/03/2013, 8:29 AM

## 2013-05-03 NOTE — Care Management Note (Signed)
Cm spoke with patient at the bedside concerning discharge planning. Pt to discharge home with [facility] services and DME per MD order. Pt has workers Health and safety inspector. Cm spoke with pt's Case Worker Mingo Amber at [phone], faxed MD orders & Dc summary to [phone]. Confirmation received. [name] Case Worker to have DME delivered to home this evening and contact pt concerning agency to provide [facility]. Pt to dc home with spouse. Pt states having a walker for home dme use until his equipment is delivered.     Venita Lick Arrie Borrelli,MSN,RN [phone]

## 2013-05-03 NOTE — Care Management Note (Signed)
Cm spoke with [employer] at [phone] contracted with Workers Compensation to arrange HH/DME services for pt. Per rep [employer] DME to provide RW and 3n1, delivery anticipated to bedside prior to discharge. Per rep [employer] to provide HHPT. Scheduled start of care 05/05/13.    Venita Lick Gussie Towson,MSN,RN [phone]

## 2013-05-03 NOTE — Progress Notes (Signed)
Physical Therapy Treatment Patient Details Name: Mark [name] MRN: [id number] DOB: November 30, 1955 Today's Date: 05/03/2013 Time: 1139-1202 PT Time Calculation (min): 23 min  PT Assessment / Plan / Recommendation  History of Present Illness RTKA   PT Comments   Gait improved safety with KI. Practiced steps. Will review w/ caregiver.  Follow Up Recommendations  Home health PT     Does the patient have the potential to tolerate intense rehabilitation     Barriers to Discharge        Equipment Recommendations  None recommended by PT    Recommendations for Other Services    Frequency 7X/week   Progress towards PT Goals Progress towards PT goals: Progressing toward goals  Plan Current plan remains appropriate    Precautions / Restrictions Precautions Precautions: Fall Precaution Comments: did not use KI:  has tingling at calf Required Braces or Orthoses: Knee Immobilizer - Right Restrictions Weight Bearing Restrictions: No   Pertinent Vitals/Pain < 3    Mobility  Bed Mobility Bed Mobility: Sit to Supine Supine to Sit: 4: Min assist Sitting - Scoot to Edge of Bed: 4: Min guard Sit to Supine: 4: Min assist Details for Bed Mobility Assistance: R leg supported. Transfers Transfers: Sit to Stand Sit to Stand: 5: Supervision;From chair/3-in-1;With upper extremity assist Stand to Sit: To bed;With upper extremity assist Details for Transfer Assistance: cues for hand and leg placement Ambulation/Gait Ambulation/Gait Assistance: 5: Supervision Ambulation Distance (Feet): 50 Feet Assistive device: Rolling walker Ambulation/Gait Assistance Details: cues for posture and sequence. Gait Pattern: Antalgic;Decreased step length - right;Step-through pattern Stairs: Yes Stairs Assistance: 4: Min assist Stairs Assistance Details (indicate cue type and reason): verbal cues for technique Stair Management Technique: No rails;Backwards;With walker Number of Stairs: 2    Exercises Total  Joint Exercises Quad Sets: AROM;Both;10 reps;Supine Heel Slides: AAROM;Right;10 reps;Supine Hip ABduction/ADduction: AAROM;Right;10 reps;Supine Straight Leg Raises: AAROM;Right;10 reps;Supine   PT Diagnosis: Acute pain  PT Problem List: Decreased strength;Decreased range of motion;Decreased activity tolerance;Decreased mobility;Decreased knowledge of use of DME;Decreased safety awareness;Decreased knowledge of precautions;Pain PT Treatment Interventions: DME instruction;Gait training;Stair training;Therapeutic activities;Functional mobility training;Patient/family education   PT Goals (current goals can now be found in the care plan section) Acute Rehab PT Goals Patient Stated Goal: i will walk. PT Goal Formulation: With patient Time For Goal Achievement: 05/07/13 Potential to Achieve Goals: Good  Visit Information  Last PT Received On: 05/03/13 Assistance Needed: +1 History of Present Illness: RTKA    Subjective Data  Patient Stated Goal: i will walk.   Cognition  Cognition Arousal/Alertness: Awake/alert Behavior During Therapy: WFL for tasks assessed/performed Overall Cognitive Status: Within Functional Limits for tasks assessed    Balance     End of Session PT - End of Session Equipment Utilized During Treatment: Right knee immobilizer Activity Tolerance: Patient tolerated treatment well Patient left: in bed;with call bell/phone within reach Nurse Communication: Mobility status   GP     Claretha Cooper 05/03/2013, 1:47 PM

## 2013-05-05 NOTE — Discharge Summary (Signed)
Physician Discharge Summary  Patient ID: Mark [name] MRN: [id number] DOB/AGE: 60-Jun-1957 60 y.o.  Admit date: 05/02/2013 Discharge date: 05/03/2013   Procedures:  Procedure(s) (LRB): RIGHT TOTAL KNEE ARTHROPLASTY (Right)  Attending Physician:  Dr. Paralee Cancel   Admission Diagnoses:   Right knee OA / pain  Discharge Diagnoses:  Principal Problem:   S/P right TKA Active Problems:   Expected blood loss anemia  Past Medical History  Diagnosis Date  . Cirrhosis   . Alcohol abuse   . Anemia   . Thrombocytopenia   . Ascites   . Hypertension   . Hyperlipidemia   . Heart murmur   . Myocardial infarction   . Leg swelling   . Stroke     no deficits  . Arthritis     HPI: Mark [name], 58 y.o. male, has a history of pain and functional disability in the right knee due to arthritis and has failed non-surgical conservative treatments for greater than 12 weeks to includeNSAID's and/or analgesics, corticosteriod injections and activity modification. Onset of symptoms was gradual, starting 4-5 years ago with gradually worsening course since that time. The patient noted prior procedures on the knee to include arthroscopy on the right knee(s). Patient currently rates pain in the right knee(s) at 10 out of 10 with activity. Patient has night pain, worsening of pain with activity and weight bearing, pain that interferes with activities of daily living, pain with passive range of motion, crepitus and joint swelling. Patient has evidence of periarticular osteophytes and joint space narrowing by imaging studies. There is no active infection. Risks, benefits and expectations were discussed with the patient. Risks including but not limited to the risk of anesthesia, blood clots, nerve damage, blood vessel damage, failure of the prosthesis, infection and up to and including death. Patient understand the risks, benefits and expectations and wishes to proceed with surgery.  PCP: No PCP Per Patient     Discharged Condition: good  Hospital Course:  Patient underwent the above stated procedure on 05/02/2013. Patient tolerated the procedure well and brought to the recovery room in good condition and subsequently to the floor.  POD #1 BP: 109/56 ; Pulse: 64 ; Temp: 99.3 F (37.4 C) ; Resp: 16  Patient reports pain as mild, states the pain is controlled with the medication. No events throughout the night. Hasn't been up with PT yet, but if he does well the plan would to be discharged home with HHPT. Neurovascular intact, dorsiflexion/plantar flexion intact, incision: dressing C/D/I, no cellulitis present and compartment soft.   LABS  Basename    HGB  8.6  HCT  23.6    Discharge Exam: General appearance: alert, cooperative and no distress Extremities: Homans sign is negative, no sign of DVT, no edema, redness or tenderness in the calves or thighs and no ulcers, gangrene or trophic changes  Disposition:    Home-Health Care Svc with follow up in 2 weeks   Follow-up Information   Follow up with Mauri Pole, MD. Schedule an appointment as soon as possible for a visit in 2 weeks.   Specialty:  Orthopedic Surgery   Contact information:   [address] [place] [address] [phone]       Follow up with [facility].   Contact information:   [place] [address] [phone]       Discharge Orders   Future Orders Complete By Expires   Call MD / Call 911  As directed    Comments:     If you experience chest pain or shortness of breath, CALL 911 and be transported to the hospital emergency room.  If you develope a fever above 101 F, pus (white drainage) or increased drainage or redness at the wound, or calf pain, call your surgeon's office.   Change dressing  As directed    Comments:     Maintain surgical dressing for 10-14 days, or until follow up in the clinic.   Constipation Prevention  As directed    Comments:      Drink plenty of fluids.  Prune juice may be helpful.  You may use a stool softener, such as Colace (over the counter) 100 mg twice a day.  Use MiraLax (over the counter) for constipation as needed.   Diet - low sodium heart healthy  As directed    Discharge instructions  As directed    Comments:     Maintain surgical dressing for 10-14 days, or until follow up in the clinic. Follow up in 2 weeks at [facility]. Call with any questions or concerns.   Driving restrictions  As directed    Comments:     No driving for 4 weeks   Increase activity slowly as tolerated  As directed    TED hose  As directed    Comments:     Use stockings (TED hose) for 2 weeks on both leg(s).  You may remove them at night for sleeping.   Weight bearing as tolerated  As directed    Questions:     Laterality:     Extremity:          Medication List    STOP taking these medications       aspirin 81 MG tablet  Replaced by:  aspirin 325 MG EC tablet     HYDROcodone-ibuprofen 7.5-200 MG per tablet  Commonly known as:  VICOPROFEN      TAKE these medications       aspirin 325 MG EC tablet  Take 1 tablet (325 mg total) by mouth 2 (two) times daily.     DSS 100 MG Caps  Take 100 mg by mouth 2 (two) times daily.     ferrous sulfate 325 (65 FE) MG tablet  Take 1 tablet (325 mg total) by mouth 3 (three) times daily after meals.     furosemide 20 MG tablet  Commonly known as:  LASIX  Take 20 mg by mouth at bedtime.     HYDROcodone-acetaminophen 7.5-325 MG per tablet  Commonly known as:  NORCO  Take 1-2 tablets by mouth every 4 (four) hours as needed for moderate pain.     methocarbamol 500 MG tablet  Commonly known as:  ROBAXIN  Take 1 tablet (500 mg total) by mouth every 6 (six) hours as needed for muscle spasms.     nadolol 20 MG tablet  Commonly known as:  CORGARD  Take 20 mg by mouth at bedtime.     polyethylene glycol packet  Commonly known as:  MIRALAX / GLYCOLAX  Take 17 g by  mouth 2 (two) times daily.     spironolactone 50 MG tablet  Commonly known as:  ALDACTONE  Take 50 mg by mouth at bedtime.         Signed: West [name]. Mark [name]   PAC  05/05/2013, 1:21 PM

## 2013-05-21 ENCOUNTER — Encounter: Payer: Self-pay | Admitting: Gastroenterology

## 2013-06-29 ENCOUNTER — Encounter (HOSPITAL_COMMUNITY): Payer: Self-pay | Admitting: Cardiovascular Disease

## 2013-07-28 ENCOUNTER — Encounter (HOSPITAL_COMMUNITY): Payer: Self-pay | Admitting: Interventional Cardiology

## 2013-12-01 ENCOUNTER — Encounter: Payer: Self-pay | Admitting: Internal Medicine

## 2013-12-01 ENCOUNTER — Telehealth: Payer: Self-pay

## 2013-12-01 ENCOUNTER — Ambulatory Visit (INDEPENDENT_AMBULATORY_CARE_PROVIDER_SITE_OTHER): Payer: BC Managed Care – PPO | Admitting: Internal Medicine

## 2013-12-01 VITALS — BP 128/74 | HR 63 | Temp 98.0°F | Resp 18 | Ht 67.0 in | Wt 170.0 lb

## 2013-12-01 DIAGNOSIS — N183 Chronic kidney disease, stage 3 unspecified: Secondary | ICD-10-CM | POA: Insufficient documentation

## 2013-12-01 DIAGNOSIS — K7460 Unspecified cirrhosis of liver: Secondary | ICD-10-CM

## 2013-12-01 DIAGNOSIS — F1910 Other psychoactive substance abuse, uncomplicated: Secondary | ICD-10-CM | POA: Insufficient documentation

## 2013-12-01 DIAGNOSIS — K7030 Alcoholic cirrhosis of liver without ascites: Secondary | ICD-10-CM

## 2013-12-01 DIAGNOSIS — M7989 Other specified soft tissue disorders: Secondary | ICD-10-CM

## 2013-12-01 DIAGNOSIS — Z8673 Personal history of transient ischemic attack (TIA), and cerebral infarction without residual deficits: Secondary | ICD-10-CM

## 2013-12-01 DIAGNOSIS — R601 Generalized edema: Secondary | ICD-10-CM | POA: Insufficient documentation

## 2013-12-01 DIAGNOSIS — D649 Anemia, unspecified: Secondary | ICD-10-CM

## 2013-12-01 DIAGNOSIS — R609 Edema, unspecified: Secondary | ICD-10-CM

## 2013-12-01 DIAGNOSIS — I200 Unstable angina: Secondary | ICD-10-CM

## 2013-12-01 DIAGNOSIS — I249 Acute ischemic heart disease, unspecified: Secondary | ICD-10-CM | POA: Insufficient documentation

## 2013-12-01 LAB — CBC WITH DIFFERENTIAL/PLATELET
Basophils Absolute: 0.1 10*3/uL (ref 0.0–0.1)
Basophils Relative: 2 % — ABNORMAL HIGH (ref 0–1)
Eosinophils Absolute: 0.4 10*3/uL (ref 0.0–0.7)
Eosinophils Relative: 10 % — ABNORMAL HIGH (ref 0–5)
HCT: 27.9 % — ABNORMAL LOW (ref 39.0–52.0)
Hemoglobin: 9.4 g/dL — ABNORMAL LOW (ref 13.0–17.0)
Lymphocytes Relative: 28 % (ref 12–46)
Lymphs Abs: 1.1 10*3/uL (ref 0.7–4.0)
MCH: 30.8 pg (ref 26.0–34.0)
MCHC: 33.7 g/dL (ref 30.0–36.0)
MCV: 91.5 fL (ref 78.0–100.0)
Monocytes Absolute: 0.5 10*3/uL (ref 0.1–1.0)
Monocytes Relative: 12 % (ref 3–12)
Neutro Abs: 1.8 10*3/uL (ref 1.7–7.7)
Neutrophils Relative %: 48 % (ref 43–77)
Platelets: 155 10*3/uL (ref 150–400)
RBC: 3.05 MIL/uL — ABNORMAL LOW (ref 4.22–5.81)
RDW: 18 % — ABNORMAL HIGH (ref 11.5–15.5)
WBC: 3.8 10*3/uL — ABNORMAL LOW (ref 4.0–10.5)

## 2013-12-01 NOTE — Telephone Encounter (Signed)
Message copied by Barron Alvine on Thu Dec 01, 2013 12:51 PM ------      Message from: Owens Loffler P      Created: Thu Dec 01, 2013 11:59 AM       He needs rov with extender or myself in next 2-3 weeks, bmet the day prior.  For cirrhosis care.             (just spoke with his PCP, his LE edema is worsening, they will increase diuretic doses today.) ------

## 2013-12-01 NOTE — Telephone Encounter (Signed)
12/15/13 930 am appt with Nevin Bloodgood labs prior pt is aware and will call with any problems or questions

## 2013-12-01 NOTE — Progress Notes (Signed)
Patient ID: Mark [name], male   DOB: 03-Jun-1955, 58 y.o.   MRN: [id number]   Mark [name], is a 58 y.o. male  VHQ:[id number]  UXL:[id number]  DOB - February 27, 1956  CC:  Chief Complaint  Patient presents with  . Establish Care       HPI: Mark [name] is a 58 y.o. male here today to establish medical care. He has a previous diagnosis of Cirrhosis with Portal HTN and has been under the care of Dr. Owens Loffler. He has continued to use ETOH very- about 1 beer/week. Today he presents with anasarca up to his thighs but not to the scrotal area. He denies SOB, Fever, Chills, Cough, Diarrhea. He does have some increased abdominal girth, but no abdominal pain.  He also states that he snorts heroine on a daily basis and wants to quit but is afraid of the withdrawal symptoms.  Patient has No headache, No chest pain, No abdominal pain - No Nausea, No new weakness tingling or numbness, No Cough - SOB.  No Known Allergies Past Medical History  Diagnosis Date  . Cirrhosis   . Alcohol abuse   . Anemia   . Thrombocytopenia   . Ascites   . Hypertension   . Hyperlipidemia   . Heart murmur   . Myocardial infarction   . Leg swelling   . Stroke     no deficits  . Arthritis    Current Outpatient Prescriptions on File Prior to Visit  Medication Sig Dispense Refill  . furosemide (LASIX) 20 MG tablet Take 20 mg by mouth at bedtime.       . methocarbamol (ROBAXIN) 500 MG tablet Take 1 tablet (500 mg total) by mouth every 6 (six) hours as needed for muscle spasms.  50 tablet  0  . nadolol (CORGARD) 20 MG tablet Take 20 mg by mouth at bedtime.      Marland Kitchen spironolactone (ALDACTONE) 50 MG tablet Take 50 mg by mouth at bedtime.      . docusate sodium 100 MG CAPS Take 100 mg by mouth 2 (two) times daily.  10 capsule  0  . polyethylene glycol (MIRALAX / GLYCOLAX) packet Take 17 g by mouth 2 (two) times daily.  14 each  0   No current facility-administered medications on file prior to visit.   Family  History  Problem Relation Age of Onset  . Hypertension Father   . Diabetes Father   . Dementia Mother   . Lupus Sister    History   Social History  . Marital Status: Married    Spouse Name: N/A    Number of Children: 4  . 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 Smoking status: Current Every Day Smoker -- 0.50 packs/day for 10 years    Types: Cigarettes  . Smokeless tobacco: Never Used     Comment: 4 cigarettes a day  . Alcohol Use: Yes     Comment:  beer  80 oz  week  . Drug Use: Yes    Special: Marijuana, Heroin     Comment: cocaine 6 months ago  . Sexual Activity: Not on file     Comment: now and then smokies marijuana   Other Topics Concern  . Not on file   Social History Narrative  . No narrative on file    Review of Systems: Constitutional: Negative for fever, chills, diaphoresis, activity change, appetite  change and fatigue. HENT: Negative for ear pain, nosebleeds, congestion, facial swelling, rhinorrhea, neck pain, neck stiffness and ear discharge.  Eyes: Negative for pain, discharge, redness, itching and visual disturbance. Respiratory: Negative for cough, choking, chest tightness, shortness of breath, wheezing and stridor.  Cardiovascular: Negative for chest pain, palpitations and leg swelling. Gastrointestinal: Negative for abdominal distention. Pt does have increase abdominal girth Genitourinary: Negative for dysuria, urgency, frequency, hematuria, flank pain, decreased urine volume, difficulty urinating and dyspareunia.  Musculoskeletal: Negative for back pain, joint swelling, arthralgia and gait problem. (+) swelling BLE's. Neurological: Negative for dizziness, tremors, seizures, syncope, facial asymmetry, speech difficulty, weakness, light-headedness, numbness and headaches.  Hematological: Negative for adenopathy. Does not bruise/bleed easily. Psychiatric/Behavioral: Negative for  hallucinations, behavioral problems, confusion, dysphoric mood, decreased concentration and agitation.    Objective:   Filed Vitals:   12/01/13 1050  BP: 128/74  Pulse: 63  Temp: 98 F (36.7 C)  Resp: 18    Physical Exam: Constitutional: Patient appears well-developed and well-nourished. No distress. HENT: Normocephalic, atraumatic, External right and left ear normal. Oropharynx is clear and moist.  Eyes: Conjunctivae and EOM are normal. PERRLA, no scleral icterus. Neck: Normal ROM. Neck supple. No JVD. No tracheal deviation. No thyromegaly. CVS: RRR, S1/S2 +, no murmurs, no gallops, no carotid bruit.  Pulmonary: Effort and breath sounds normal, no stridor, rhonchi, wheezes, rales.  Abdominal: Soft. BS +, no distension, tenderness, rebound or guarding.  Musculoskeletal: Normal range of motion, and no tenderness. Pt has 2+ edema up to level of thighs. Lymphadenopathy: No lymphadenopathy noted, cervical, inguinal or axillary Neuro: Alert. Normal reflexes, muscle tone coordination. No cranial nerve deficit. Skin: Skin is warm and dry. No rash noted. Not diaphoretic. No erythema. No pallor. Psychiatric: Normal mood and affect. Behavior, judgment, thought content normal.       Assessment and plan:   1. Anasarca - This is most likely resultant from Cirrhosis but other causes including cardiac causes cannot be ignored. Will likely need to have diuretic doses increased. However will hold at current dose until renal function and electrolytes have been evaluated. - Comprehensive metabolic panel - EKG 25-ZDGL - 2D Echocardiogram with contrast; Future - TSH  2. Alcoholic cirrhosis of liver without ascites - Pt will likely need a Cof abdomen and paracentesis. However will defer to Gastroenterologist. - Comprehensive metabolic panel - CBC with Differential - Magnesium - Phosphorus - Vitamin D, 25-hydroxy - EKG 12-Lead  3. ACS (acute coronary syndrome) - Pt reports a history of MI  however I have no documentation to support. - Lipid panel - EKG 12-Lead  4.  H/O: CVA (cerebrovascular accident) - CVA is self reported, and pt has no residual effects.  5. HTN - Pt likely has HTN as his BO is normotensive with Diuretics and B-blocker.  6. Anemia: - Review of CBC from 04/2013 shows anemia. - CBC with Differential  7.CKD III - Last Cr showed GFR of 52. This is probably related to the diuretic use. I will check renal function before adjusting any medications. - Comprehensive metabolic panel   Substance abuse - Pt uses Heroine daily and wants to quit. I will check with ADS about resources.     Follow-up in one week  The patient was given clear instructions to go to ER or return to medical center if symptoms don't improve, worsen or new problems develop. The patient verbalized understanding. The patient was told to call to get lab results if they haven't heard anything in the next  week.     This note has been created with Surveyor, quantity. Any transcriptional errors are unintentional.    MATTHEWS,MICHELLE A., MD [place], [place]   12/01/2013, 12:04 PM

## 2013-12-02 LAB — LIPID PANEL
Cholesterol: 84 mg/dL (ref 0–200)
HDL: 26 mg/dL — ABNORMAL LOW (ref >39)
LDL Cholesterol: 46 mg/dL (ref 0–99)
Total CHOL/HDL Ratio: 3.2 Ratio
Triglycerides: 58 mg/dL (ref <150)
VLDL: 12 mg/dL (ref 0–40)

## 2013-12-02 LAB — VITAMIN D 25 HYDROXY (VIT D DEFICIENCY, FRACTURES): Vit D, 25-Hydroxy: 10 ng/mL — ABNORMAL LOW (ref 30–89)

## 2013-12-02 LAB — COMPREHENSIVE METABOLIC PANEL
ALT: 22 U/L (ref 0–53)
AST: 65 U/L — ABNORMAL HIGH (ref 0–37)
Albumin: 2 g/dL — ABNORMAL LOW (ref 3.5–5.2)
Alkaline Phosphatase: 147 U/L — ABNORMAL HIGH (ref 39–117)
BUN: 4 mg/dL — ABNORMAL LOW (ref 6–23)
CO2: 24 mEq/L (ref 19–32)
Calcium: 7.5 mg/dL — ABNORMAL LOW (ref 8.4–10.5)
Chloride: 109 mEq/L (ref 96–112)
Creat: 0.82 mg/dL (ref 0.50–1.35)
Glucose, Bld: 97 mg/dL (ref 70–99)
Potassium: 5.1 mEq/L (ref 3.5–5.3)
Sodium: 137 mEq/L (ref 135–145)
Total Bilirubin: 1.4 mg/dL — ABNORMAL HIGH (ref 0.2–1.2)
Total Protein: 6.4 g/dL (ref 6.0–8.3)

## 2013-12-02 LAB — PHOSPHORUS: Phosphorus: 3.2 mg/dL (ref 2.3–4.6)

## 2013-12-02 LAB — TSH: TSH: 2.517 u[IU]/mL (ref 0.350–4.500)

## 2013-12-02 LAB — MAGNESIUM: Magnesium: 1.6 mg/dL (ref 1.5–2.5)

## 2013-12-05 ENCOUNTER — Encounter: Payer: Self-pay | Admitting: Internal Medicine

## 2013-12-05 ENCOUNTER — Ambulatory Visit: Payer: Worker's Compensation | Admitting: Internal Medicine

## 2013-12-05 VITALS — BP 134/68 | HR 69 | Temp 98.2°F | Resp 14 | Ht 67.0 in | Wt 168.0 lb

## 2013-12-05 DIAGNOSIS — K7031 Alcoholic cirrhosis of liver with ascites: Secondary | ICD-10-CM

## 2013-12-05 DIAGNOSIS — Z23 Encounter for immunization: Secondary | ICD-10-CM

## 2013-12-05 DIAGNOSIS — R601 Generalized edema: Secondary | ICD-10-CM

## 2013-12-05 DIAGNOSIS — E559 Vitamin D deficiency, unspecified: Secondary | ICD-10-CM

## 2013-12-05 DIAGNOSIS — F1124 Opioid dependence with opioid-induced mood disorder: Secondary | ICD-10-CM

## 2013-12-05 MED ORDER — ERGOCALCIFEROL 1.25 MG (50000 UT) PO CAPS
50000.0000 [IU] | ORAL_CAPSULE | ORAL | Status: DC
Start: 2013-12-05 — End: 2014-02-13

## 2013-12-05 NOTE — Progress Notes (Unsigned)
Patient ID: Mark [name], male   DOB: 1955-10-10, 58 y.o.   MRN: [id number]   Mort Smelser, is a 58 y.o. male  YQM:[id number]  [id number]  DOB - 1955/08/08  CC:  Chief Complaint  Patient presents with  . Follow-up    lab results       HPI: Mark [name] is a 58 y.o. male here today to establish medical care. Patient has No headache, No chest pain, No abdominal pain - No Nausea, No new weakness tingling or numbness, No Cough - SOB.  No Known Allergies Past Medical History  Diagnosis Date  . Cirrhosis   . Alcohol abuse   . Anemia   . Thrombocytopenia   . Ascites   . Hypertension   . Hyperlipidemia   . Heart murmur   . Myocardial infarction   . Leg swelling   . Stroke     no deficits  . Arthritis    Current Outpatient Prescriptions on File Prior to Visit  Medication Sig Dispense Refill  . docusate sodium 100 MG CAPS Take 100 mg by mouth 2 (two) times daily.  10 capsule  0  . furosemide (LASIX) 20 MG tablet Take 20 mg by mouth at bedtime.       . methocarbamol (ROBAXIN) 500 MG tablet Take 1 tablet (500 mg total) by mouth every 6 (six) hours as needed for muscle spasms.  50 tablet  0  . nadolol (CORGARD) 20 MG tablet Take 20 mg by mouth at bedtime.      . polyethylene glycol (MIRALAX / GLYCOLAX) packet Take 17 g by mouth 2 (two) times daily.  14 each  0  . spironolactone (ALDACTONE) 50 MG tablet Take 50 mg by mouth at bedtime.       No current facility-administered medications on file prior to visit.   Family History  Problem Relation Age of Onset  . Hypertension Father   . Diabetes Father   . Dementia Mother   . Lupus Sister    History   Social History  . Marital Status: Married    Spouse Name: N/A    Number of Children: 4  . 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 Smoking status: Current Every Day Smoker -- 0.50 packs/day for 10 years    Types: Cigarettes  . Smokeless  tobacco: Never Used     Comment: 4 cigarettes a day  . Alcohol Use: Yes     Comment:  beer  80 oz  week  . Drug Use: Yes    Special: Marijuana, Heroin     Comment: cocaine 6 months ago  . Sexual Activity: Not on file     Comment: now and then smokies marijuana   Other Topics Concern  . Not on file   Social History Narrative  . No narrative on file    Review of Systems: Constitutional: Negative for fever, chills, diaphoresis, activity change, appetite change and fatigue. HENT: Negative for ear pain, nosebleeds, congestion, facial swelling, rhinorrhea, neck pain, neck stiffness and ear discharge.  Eyes: Negative for pain, discharge, redness, itching and visual disturbance. Respiratory: Negative for cough, choking, chest tightness, shortness of breath, wheezing and stridor.  Cardiovascular: Negative for chest pain, palpitations and leg swelling. Gastrointestinal: Negative for abdominal distention. Genitourinary: Negative for dysuria, urgency, frequency, hematuria, flank pain, decreased urine volume, difficulty urinating and dyspareunia.  Musculoskeletal: Negative for back pain, joint swelling,  arthralgia and gait problem. Neurological: Negative for dizziness, tremors, seizures, syncope, facial asymmetry, speech difficulty, weakness, light-headedness, numbness and headaches.  Hematological: Negative for adenopathy. Does not bruise/bleed easily. Psychiatric/Behavioral: Negative for hallucinations, behavioral problems, confusion, dysphoric mood, decreased concentration and agitation.    Objective:         Filed Vitals:   12/05/13 1131  BP: 134/68  Pulse: 69  Temp: 98.2 F (36.8 C)  Resp: 14    Physical Exam: Constitutional: Patient appears well-developed and well-nourished. No distress. HENT: Normocephalic, atraumatic, External right and left ear normal. Oropharynx is clear and moist.  Eyes: Conjunctivae and EOM are normal. PERRLA, no scleral icterus. Neck: Normal ROM.  Neck supple. No JVD. No tracheal deviation. No thyromegaly. CVS: RRR, S1/S2 +, no murmurs, no gallops, no carotid bruit.  Pulmonary: Effort and breath sounds normal, no stridor, rhonchi, wheezes, rales.  Abdominal: Soft. BS +, no distension, tenderness, rebound or guarding.  Musculoskeletal: Normal range of motion. No edema and no tenderness.  Lymphadenopathy: No lymphadenopathy noted, cervical, inguinal or axillary Neuro: Alert. Normal reflexes, muscle tone coordination. No cranial nerve deficit. Skin: Skin is warm and dry. No rash noted. Not diaphoretic. No erythema. No pallor. Psychiatric: Normal mood and affect. Behavior, judgment, thought content normal.  Lab Results  Component Value Date   WBC 3.8* 12/01/2013   HGB 9.4* 12/01/2013   HCT 27.9* 12/01/2013   MCV 91.5 12/01/2013   PLT 155 12/01/2013   Lab Results  Component Value Date   CREATININE 0.82 12/01/2013   BUN 4* 12/01/2013   NA 137 12/01/2013   K 5.1 12/01/2013   CL 109 12/01/2013   CO2 24 12/01/2013    Lab Results  Component Value Date   HGBA1C  Value: 5.1 (NOTE)                                                                       According to the ADA Clinical Practice Recommendations for 2011, when HbA1c is used as a screening test:   >=6.5%   Diagnostic of Diabetes Mellitus           (if abnormal result  is confirmed)  5.7-6.4%   Increased risk of developing Diabetes Mellitus  References:Diagnosis and Classification of Diabetes Mellitus,Diabetes FYBO,1751,02(HENID 1):S62-S69 and Standards of Medical Care in         Diabetes - 2011,Diabetes POEU,2353,61  (Suppl 1):S11-S61. 11/01/2009   Lipid Panel     Component Value Date/Time   CHOL 84 12/01/2013 1203   TRIG 58 12/01/2013 1203   HDL 26* 12/01/2013 1203   CHOLHDL 3.2 12/01/2013 1203   VLDL 12 12/01/2013 1203   LDLCALC 46 12/01/2013 1203       Assessment and plan:   1. Opioid dependence with opioid-induced mood disorder ***  2. Anasarca *** - Basic Metabolic Panel  3. Alcoholic  cirrhosis of liver with ascites *** - PT AND PTT  4. Vitamin D deficiency disease *** - ergocalciferol (VITAMIN D2) 50000 UNITS capsule; Take 1 capsule (50,000 Units total) by mouth once a week.  Dispense: 4 capsule; Refill: 12   No Follow-up on file.  The patient was given clear instructions to go to ER or return to medical center if symptoms don't improve,  worsen or new problems develop. The patient verbalized understanding. The patient was told to call to get lab results if they haven't heard anything in the next week.     This note has been created with Surveyor, quantity. Any transcriptional errors are unintentional.    Tymon Nemetz A., MD [place], [place]nie   12/05/2013, 12:13 PM

## 2013-12-06 LAB — BASIC METABOLIC PANEL
BUN: 3 mg/dL — ABNORMAL LOW (ref 6–23)
CO2: 23 mEq/L (ref 19–32)
Calcium: 7.2 mg/dL — ABNORMAL LOW (ref 8.4–10.5)
Chloride: 109 mEq/L (ref 96–112)
Creat: 0.74 mg/dL (ref 0.50–1.35)
Glucose, Bld: 109 mg/dL — ABNORMAL HIGH (ref 70–99)
Potassium: 5.8 mEq/L — ABNORMAL HIGH (ref 3.5–5.3)
Sodium: 137 mEq/L (ref 135–145)

## 2013-12-07 LAB — PT AND PTT

## 2013-12-08 ENCOUNTER — Telehealth: Payer: Self-pay

## 2013-12-08 NOTE — Telephone Encounter (Signed)
Called and spoke with Tammy at [facility] [phone], she has scheduled pt an appointment for counseling for 12/13/2013 @3pm . She is calling to make patient aware.

## 2013-12-15 ENCOUNTER — Ambulatory Visit (INDEPENDENT_AMBULATORY_CARE_PROVIDER_SITE_OTHER): Payer: BC Managed Care – PPO | Admitting: Nurse Practitioner

## 2013-12-15 ENCOUNTER — Encounter: Payer: Self-pay | Admitting: Nurse Practitioner

## 2013-12-15 VITALS — BP 110/62 | HR 80 | Ht 66.0 in | Wt 165.0 lb

## 2013-12-15 DIAGNOSIS — K7030 Alcoholic cirrhosis of liver without ascites: Secondary | ICD-10-CM

## 2013-12-15 DIAGNOSIS — K7031 Alcoholic cirrhosis of liver with ascites: Secondary | ICD-10-CM

## 2013-12-15 NOTE — Patient Instructions (Addendum)
You have been given a separate informational sheet regarding your tobacco use, the importance of quitting and local resources to help you quit.  You can increase your aldactone to 100 mg daily. (Take 2 of the 50 mg tablets daily.) Keep the lasix the same dosage that you have been taking.  You have been scheduled for an abdominal ultrasound at [facility] (1st floor of hospital) on Monday 8-24 at 9:00 . Please arrive  At 8:45 am.  prior to your appointment for registration. Make certain not to have anything to eat or drink 6 hours prior to your appointment. Should you need to reschedule your appointment, please contact radiology at [phone]. This test typically takes about 30 minutes to perform.  Come to our lab, basement level on 12-23-2013. Anytime between 7:30 am and 5:30 pm.     Call us in October for an appointment with Dr. Ardis Hughs the end of November or December.   Have no more than 200 mg of sodium salt in a 24 hour period.   Try to stop drinking alcohol and try to stop smoking.

## 2013-12-15 NOTE — Progress Notes (Signed)
     History of Present Illness:  Patient is a 58 year old male known to Dr. Ardis Hughs for a history of cirrhosis, likely alcohol related. Patient was last seen one year ago at which time his liver disease was felt to be fairly well compensated. Unfortunately he has not been able to discontinue alcohol. Denies illicit drug use. No abdominal pain. No gastrointestinal complaints. His main complaint is that of fatigue, he is unable to do physical labor required by his job. Patient endorses some lower extremity swelling, he is trying to follow a low sodium diet home  CBC on the sixth of this month showed numbers to be about baseline.  Current Medications, Allergies, Past Medical History, Past Surgical History, Family History and Social History were reviewed in [employer] record.   Physical Exam: General: Pleasant, well developed , black male in no acute distress Head: Normocephalic and atraumatic Eyes:  Bloodshot eyes.  Ears: Normal auditory acuity Lungs: Clear throughout to auscultation Heart: Regular rate and rhythm Abdomen: Soft, non distended, non-tender. No masses, no hepatomegaly. Normal bowel sounds. No appreciable ascites Musculoskeletal: Symmetrical with no gross deformities  Extremities: 1-2 plus bilateral lower extremity  Neurological: Alert oriented x 4, grossly nonfocal Psychological:  Alert and cooperative. Normal mood and affect  Assessment and Recommendations:   51. 58 year old male fwith cirrhosis, presumably alcohol related.Liver disease seems compensated at time of last though he unfortunately continues to drink ETOH.  He does have mild lower extremity edema.   Patient does not really understand how to calculate daily dietary sodium so we reviewed the process. .   Will continue Lasix at current dose 20 mg daily. Aldactone will be increased from 50 mg to 100 mg daily. Patient will come for a basic metabolic profile in 9-52 days to check electrolytes  and renal function. Hopefully the increased dose of spironolactone and better control of sodium intake will help with the peripheral edema, if not then Lasix will need to be increased.Marland Kitchen  He is due for annual [facility] screening. Will obtain ultrasound of the abdomen and an AFP will be drawn at time of BMET in 7-10 days.  Small to medium esophageal varices and portal gastropathy on EGD 2012. Patient remains on Corgard.  2. ETOH abuse, still trying to stop drinking.  3. Tobacco abuse.  4. Fatigue. Patient unable to do physical labor required by job. He mows grass, moves furniture and climb stairs. Patient is seeking disability and ask if Dr. Ardis Hughs will complete appropriate paperwork. I will talk with Dr. Ardis Hughs about this though patient's cirrhosis does seem reasonably compensated at this point so not sure he would qualify for disability from a gastrointestinal standpoint.

## 2013-12-16 ENCOUNTER — Encounter: Payer: Self-pay | Admitting: Nurse Practitioner

## 2013-12-17 NOTE — Progress Notes (Signed)
Sent to [facility] in error. Will now send to [name].

## 2013-12-17 NOTE — Progress Notes (Signed)
I agree with note, plan.  Will not fill out disability paperwork, he can ask his PCP.

## 2013-12-19 ENCOUNTER — Telehealth: Payer: Self-pay | Admitting: *Deleted

## 2013-12-19 ENCOUNTER — Ambulatory Visit (HOSPITAL_COMMUNITY)
Admission: RE | Admit: 2013-12-19 | Discharge: 2013-12-19 | Disposition: A | Discharge status: Home / Self Care | Payer: BC Managed Care – PPO | Source: Ambulatory Visit | Attending: Nurse Practitioner | Admitting: Nurse Practitioner

## 2013-12-19 DIAGNOSIS — K7030 Alcoholic cirrhosis of liver without ascites: Secondary | ICD-10-CM | POA: Diagnosis present

## 2013-12-19 DIAGNOSIS — F1020 Alcohol dependence, uncomplicated: Secondary | ICD-10-CM | POA: Insufficient documentation

## 2013-12-19 DIAGNOSIS — R188 Other ascites: Secondary | ICD-10-CM | POA: Insufficient documentation

## 2013-12-19 DIAGNOSIS — K7031 Alcoholic cirrhosis of liver with ascites: Secondary | ICD-10-CM

## 2013-12-19 NOTE — Telephone Encounter (Signed)
Patient given Dr. Ardis Hughs recommendation again.

## 2013-12-19 NOTE — Telephone Encounter (Signed)
Message copied by Hulan Saas on Mon Dec 19, 2013  1:37 PM ------      Message from: Willia Craze      Created: Mon Dec 19, 2013 12:16 PM       Rollene Fare, please let patient know that PCP would have to fill out disability papers. Thanks            ----- Message -----         From: Milus Banister, MD         Sent: 12/17/2013   6:31 AM           To: Willia Craze, NP                        ----- Message -----         From: Willia Craze, NP         Sent: 12/16/2013   5:19 PM           To: Milus Banister, MD                   ------

## 2013-12-19 NOTE — Telephone Encounter (Signed)
Spoke with patient and gave him Dr. Ardis Hughs recommendation.

## 2013-12-19 NOTE — Telephone Encounter (Signed)
Patient calling back and asking to schedule OV with Dr. Ardis Hughs. Scheduled on 02/13/14 at 3:45 PM.

## 2013-12-26 NOTE — Progress Notes (Signed)
Sent to me in error. 

## 2014-01-04 NOTE — Progress Notes (Signed)
Sent to me in error. 

## 2014-01-13 ENCOUNTER — Other Ambulatory Visit: Payer: Self-pay | Admitting: Gastroenterology

## 2014-01-18 ENCOUNTER — Other Ambulatory Visit: Payer: Self-pay | Admitting: Gastroenterology

## 2014-02-13 ENCOUNTER — Ambulatory Visit (INDEPENDENT_AMBULATORY_CARE_PROVIDER_SITE_OTHER): Payer: BC Managed Care – PPO | Admitting: Gastroenterology

## 2014-02-13 ENCOUNTER — Ambulatory Visit: Payer: Self-pay | Admitting: Gastroenterology

## 2014-02-13 ENCOUNTER — Encounter: Payer: Self-pay | Admitting: Gastroenterology

## 2014-02-13 ENCOUNTER — Other Ambulatory Visit (INDEPENDENT_AMBULATORY_CARE_PROVIDER_SITE_OTHER): Payer: BC Managed Care – PPO

## 2014-02-13 VITALS — BP 90/58 | HR 100 | Ht 67.0 in | Wt 164.0 lb

## 2014-02-13 DIAGNOSIS — K7030 Alcoholic cirrhosis of liver without ascites: Secondary | ICD-10-CM

## 2014-02-13 LAB — BASIC METABOLIC PANEL
BUN: 8 mg/dL (ref 6–23)
CO2: 25 mEq/L (ref 19–32)
Calcium: 7.7 mg/dL — ABNORMAL LOW (ref 8.4–10.5)
Chloride: 107 mEq/L (ref 96–112)
Creatinine, Ser: 1.2 mg/dL (ref 0.4–1.5)
GFR: 78.54 mL/min (ref >60.00)
Glucose, Bld: 99 mg/dL (ref 70–99)
Potassium: 3.9 mEq/L (ref 3.5–5.1)
Sodium: 138 mEq/L (ref 135–145)

## 2014-02-13 NOTE — Patient Instructions (Addendum)
You will have labs checked today in the basement lab.  Please head down after you check out with the front desk  (AFP for hepatoma screening, bmet). It is important that you have a relatively low salt diet.  High salt diet can cause fluid to accumulate in your legs, abdomen and even around your lungs. You should try to avoid NSAID type over the counter pain medicines as best as possible. Tylenol is safe to take for 'routine' aches and pains, but never take more than 1/2 the dose suggested on the package instructions (never more than 2 grams per day). Avoid alcohol completely. There is no reason for you to avoid excessive sunlight. Please return to see Dr. Ardis Hughs in 6 months. Dr. Ardis Hughs will call to adjust your diuretic doses after reviewing your labs (likely will increase lasix to 40mg  per day).

## 2014-02-13 NOTE — Progress Notes (Signed)
Review of pertinent gastrointestinal problems:  1. Cirrhosis (likely related to alcohol; blood work May 2012 show hepatitis C negative, hepatitis B surface antigen negative, hepatitis B surface antibody negative, iron levels low, ANA negative, AMA negative, anti-smooth muscle antibody weakly positive)  Labs 05/2011: Tbili 2.1, platelets 120s, INR around 1.6. Albumin 1.9, AST 80s, ALT normal  EGD, September 2012 found small to medium esophageal varices, portal gastropathy, no gastric varices. Nadolol was started.  Most recent imaging: 11/2013 [place] small amt ascites, no focal lesions, + cirrhosis  Most recent alpha-fetoprotein: 11/2012 was normal  Duplex US 3/12 showed portal vein thrombus with NO flow in main portal vein  Hepatitis A/B immunization series started 05/2011  HPI: This is a  very pleasant 58 year old man whom I last saw about a year ago. He was in our office 2 months ago for what seems like routine cirrhosis followup however he has no recall of that visit.   Feels week, his balance is off.   Drinks about 6 pack of beer per week.  He was asking today about disability forms and that I lift her restrictions for him at work so that he may work outside in the sunlight. I have no memory of ever writing a note to his employer saying he could not work in the sun. I don't see any that have documentation in his charting either..   Past Medical History  Diagnosis Date  . Cirrhosis   . Alcohol abuse   . Anemia   . Thrombocytopenia   . Ascites   . Hypertension   . Hyperlipidemia   . Heart murmur   . Myocardial infarction   . Leg swelling   . Stroke     no deficits  . Arthritis     Past Surgical History  Procedure Laterality Date  . Knee arthroscopy      bilateral/  12/14  . Hernia repair Right     inguinal  . Total knee arthroplasty Right 05/02/2013    Procedure: RIGHT TOTAL KNEE ARTHROPLASTY;  Surgeon: Mauri Pole, MD;  Location: [facility];  Service: Orthopedics;  Laterality: Right;     Current Outpatient Prescriptions  Medication Sig Dispense Refill  . furosemide (LASIX) 20 MG tablet take 1 tablet by mouth once daily  30 tablet  11  . nadolol (CORGARD) 20 MG tablet take 1 tablet by mouth once daily  30 tablet  11  . spironolactone (ALDACTONE) 50 MG tablet Take 2 tablets (100 mg total) by mouth daily.  60 tablet  2   No current facility-administered medications for this visit.    Allergies as of 02/13/2014  . (No Known Allergies)    Family History  Problem Relation Age of Onset  . Hypertension Father   . Diabetes Father   . Dementia Mother   . Lupus Sister     History   Social History  . Marital Status: Married    Spouse Name: N/A    Number of Children: 4  . 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 Smoking status: Current Every Day Smoker -- 0.50 packs/day for 10 years    Types: Cigarettes  . Smokeless tobacco: Never Used     Comment: 4 cigarettes a day  . Alcohol Use: Yes     Comment:  beer  80 oz  week  . Drug Use: Yes    Special: Marijuana, Heroin  Comment: cocaine 6 months ago  . Sexual Activity: Not on file     Comment: now and then smokies marijuana   Other Topics Concern  . Not on file   Social History Narrative  . No narrative on file      Physical Exam: BP 90/58  Pulse 100  Ht 5\' 7"  (1.702 m)  Wt 164 lb (74.39 kg)  BMI 25.68 kg/m2 Constitutional: chronically ill appearing. Psychiatric: alert and oriented x3 Abdomen: soft, nontender, nondistended, mild ascites, no peritoneal signs, normal bowel sounds 1+ lower extremity edema bilaterally     Assessment and plan: 58 y.o. male with cirrhosis, ascites, ongoing alcohol consumption  He is on fairly minimal diuretic doses and I will have him get a basic metabolic profile today and will adjust his diuretic doses upward if possible. Likely I will increase his Lasix to 40 mg a day from 20 mg per day.  He is taking 100 mg a day and Aldactone already. He's alpha-fetoprotein for hepatoma screening. He is up-to-date with imaging, ultrasound 2 months ago showed no discrete lesions. She will return to see me in 6 months and sooner if needed.

## 2014-02-14 ENCOUNTER — Other Ambulatory Visit: Payer: Self-pay

## 2014-02-14 DIAGNOSIS — K7460 Unspecified cirrhosis of liver: Secondary | ICD-10-CM

## 2014-02-14 LAB — AFP TUMOR MARKER: AFP-Tumor Marker: 4 ng/mL (ref <6.1)

## 2014-02-14 MED ORDER — SPIRONOLACTONE 50 MG PO TABS: 100.0000 mg | ORAL_TABLET | Freq: Two times a day (BID) | ORAL | Status: DC

## 2014-02-28 ENCOUNTER — Telehealth: Payer: Self-pay

## 2014-02-28 NOTE — Telephone Encounter (Signed)
-----   Message from Barron Alvine, [place] sent at 02/14/2014  9:42 AM EDT ----- Pt needs repeat BMET see 02/14/14 note

## 2014-02-28 NOTE — Telephone Encounter (Signed)
The patient has been notified of this information and all questions answered.

## 2014-03-09 ENCOUNTER — Ambulatory Visit (INDEPENDENT_AMBULATORY_CARE_PROVIDER_SITE_OTHER): Payer: BC Managed Care – PPO | Admitting: Internal Medicine

## 2014-03-09 ENCOUNTER — Encounter: Payer: Self-pay | Admitting: Internal Medicine

## 2014-03-09 VITALS — BP 115/56 | HR 69 | Temp 98.1°F | Resp 14 | Ht 67.0 in | Wt 166.0 lb

## 2014-03-09 DIAGNOSIS — E559 Vitamin D deficiency, unspecified: Secondary | ICD-10-CM

## 2014-03-09 DIAGNOSIS — K7030 Alcoholic cirrhosis of liver without ascites: Secondary | ICD-10-CM

## 2014-03-11 LAB — PT AND PTT

## 2014-03-13 MED ORDER — ERGOCALCIFEROL 1.25 MG (50000 UT) PO CAPS: 50000.0000 [IU] | ORAL_CAPSULE | ORAL | Status: DC

## 2014-03-13 NOTE — Progress Notes (Signed)
Patient ID: Mark [name], male   DOB: 1955/04/29, 58 y.o.   MRN: [id number]   Mark [name], is a 58 y.o. male  KGY:[id number]  WYO:[id number]  DOB - Apr 01, 1956  CC:  Chief Complaint  Patient presents with  . Follow-up       HPI: Mark [name] is a 58 y.o. male here today for follow up. He is concerned that he is unable to return to work as he requested medical restriction which was  granted and has resulted in him being placed on medical leave.  He states that he has cut down considerably on his ise of heroin and feels that he will be able to stop it's use in the near future. He continues to refuse any medical assistance with cessation of heroin use. He otherwise has no problems. A review of his labs shows a normal cholesterol level and a low HDL.,  normocytic anemia, low vitamin D levels. He has had PT/PTT ordered since august to evaluate liver synthetic function but has not had the specimen collected. He is also overdue for Influenza vaccine but has declined today. I have explained the purpose of the vaccine, its risks and benefits and pt still declines.   Patient has No headache, No chest pain, No abdominal pain - No Nausea, No new weakness tingling or numbness, No Cough - SOB.  No Known Allergies Past Medical History  Diagnosis Date  . Cirrhosis   . Alcohol abuse   . Anemia   . Thrombocytopenia   . Ascites   . Hypertension   . Hyperlipidemia   . Heart murmur   . Myocardial infarction   . Leg swelling   . Stroke     no deficits  . Arthritis    Current Outpatient Prescriptions on File Prior to Visit  Medication Sig Dispense Refill  . furosemide (LASIX) 20 MG tablet take 1 tablet by mouth once daily 30 tablet 11  . nadolol (CORGARD) 20 MG tablet take 1 tablet by mouth once daily 30 tablet 11  . spironolactone (ALDACTONE) 50 MG tablet Take 2 tablets (100 mg total) by mouth 2 (two) times daily. 120 tablet 3   No current facility-administered medications on file prior to  visit.   Family History  Problem Relation Age of Onset  . Hypertension Father   . Diabetes Father   . Dementia Mother   . Lupus Sister    History   Social History  . Marital Status: Married    Spouse Name: N/A    Number of Children: 4  . 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 Smoking status: Current Every Day Smoker -- 0.50 packs/day for 10 years    Types: Cigarettes  . Smokeless tobacco: Never Used     Comment: 4 cigarettes a day  . Alcohol Use: Yes     Comment:  beer  80 oz  week  . Drug Use: Yes    Special: Marijuana, Heroin     Comment: cocaine 6 months ago  . Sexual Activity: Not on file     Comment: now and then smokies marijuana   Other Topics Concern  . Not on file   Social History Narrative  . No narrative on file    Review of Systems: Constitutional: Negative for fever, chills, diaphoresis, activity change, appetite change and fatigue. HENT: Negative for ear pain, nosebleeds, congestion, facial swelling, rhinorrhea, neck pain,  neck stiffness and ear discharge.  Eyes: Negative for pain, discharge, redness, itching and visual disturbance. Respiratory: Negative for cough, choking, chest tightness, shortness of breath, wheezing and stridor.  Cardiovascular: Negative for chest pain, palpitations and leg swelling. Gastrointestinal: Negative for abdominal distention. Genitourinary: Negative for dysuria, urgency, frequency, hematuria, flank pain, decreased urine volume, difficulty and urinating.  Musculoskeletal: Negative for back pain, joint swelling, arthralgia and gait problem. Neurological: Negative for dizziness, tremors, seizures, syncope, facial asymmetry, speech difficulty, weakness, light-headedness, numbness and headaches.  Hematological: Negative for adenopathy. Does not bruise/bleed easily. Psychiatric/Behavioral: Negative for hallucinations, behavioral problems, confusion,  dysphoric mood, decreased concentration and agitation.    Objective:    Filed Vitals:   03/09/14 1218  BP: 115/56  Pulse: 69  Temp: 98.1 F (36.7 C)  Resp: 14    Physical Exam: Constitutional: Patient appears well-developed and well-nourished. No distress. HENT: Normocephalic, atraumatic, External right and left ear normal. Oropharynx is clear and moist.  Eyes: Conjunctivae and EOM are normal. PERRLA, no scleral icterus. Neck: Normal ROM. Neck supple. No JVD. No tracheal deviation. No thyromegaly. CVS: RRR, S1/S2 +, no murmurs, no gallops, no carotid bruit.  Pulmonary: Effort and breath sounds normal, no stridor, rhonchi, wheezes, rales.  Abdominal: Soft. BS +, no distension, tenderness, rebound or guarding.  Musculoskeletal: Normal range of motion. No edema and no tenderness.  Lymphadenopathy: No lymphadenopathy noted, cervical, inguinal or axillary Neuro: Alert. Normal reflexes, muscle tone coordination. No cranial nerve deficit. Skin: Skin is warm and dry. No rash noted. Not diaphoretic. No erythema. No pallor. Psychiatric: Normal mood and affect. Behavior, judgment, thought content normal.  Lab Results  Component Value Date   WBC 3.8* 12/01/2013   HGB 9.4* 12/01/2013   HCT 27.9* 12/01/2013   MCV 91.5 12/01/2013   PLT 155 12/01/2013   Lab Results  Component Value Date   CREATININE 1.2 02/13/2014   BUN 8 02/13/2014   NA 138 02/13/2014   K 3.9 02/13/2014   CL 107 02/13/2014   CO2 25 02/13/2014    Lab Results  Component Value Date   HGBA1C  11/01/2009    5.1 (NOTE)                                                                       According to the ADA Clinical Practice Recommendations for 2011, when HbA1c is used as a screening test:   >=6.5%   Diagnostic of Diabetes Mellitus           (if abnormal result  is confirmed)  5.7-6.4%   Increased risk of developing Diabetes Mellitus  References:Diagnosis and Classification of Diabetes Mellitus,Diabetes  EPPI,9518,84(ZYSAY 1):S62-S69 and Standards of Medical Care in         Diabetes - 2011,Diabetes Care,2011,34  (Suppl 1):S11-S61.   Lipid Panel     Component Value Date/Time   CHOL 84 12/01/2013 1203   TRIG 58 12/01/2013 1203   HDL 26* 12/01/2013 1203   CHOLHDL 3.2 12/01/2013 1203   VLDL 12 12/01/2013 1203   LDLCALC 46 12/01/2013 1203       Assessment and plan:   1. Alcoholic cirrhosis of liver without ascites - Pt doing well. He has followed up with Dr. Ardis Hughs to whom I will  defer in management of his cirrhotic disease. - PT AND PTT  2. Vitamin D deficiency - Will order Drisdol 50000 Units x 4 weeks then recheck vitamin D levels. If Vitamin D stores are adequate at that time,  then start on daily vitamin D replacement.  3. Substance Abuse - Pt continues to use heroin and is not interested in  Rehab or counseling at this time.  Follow-up one month for labs or PRN. The patient was given clear instructions to go to ER or return to medical center if symptoms don't improve, worsen or new problems develop. The patient verbalized understanding. The patient was told to call to get lab results if they haven't heard anything in the next week.     This note has been created with Surveyor, quantity. Any transcriptional errors are unintentional.    Duwan Adrian A., MD [place], [place]   03/13/2014, 11:34 AM

## 2014-06-15 ENCOUNTER — Encounter: Payer: Self-pay | Admitting: Internal Medicine

## 2014-06-27 ENCOUNTER — Encounter: Payer: Self-pay | Admitting: Gastroenterology

## 2014-06-29 ENCOUNTER — Ambulatory Visit (INDEPENDENT_AMBULATORY_CARE_PROVIDER_SITE_OTHER): Payer: BC Managed Care – PPO | Admitting: Internal Medicine

## 2014-06-29 VITALS — BP 118/59 | HR 72 | Temp 98.0°F | Resp 16 | Ht 67.0 in | Wt 164.0 lb

## 2014-06-29 DIAGNOSIS — M25561 Pain in right knee: Secondary | ICD-10-CM

## 2014-06-29 DIAGNOSIS — N528 Other male erectile dysfunction: Secondary | ICD-10-CM

## 2014-06-29 DIAGNOSIS — Z0000 Encounter for general adult medical examination without abnormal findings: Secondary | ICD-10-CM

## 2014-06-29 DIAGNOSIS — E559 Vitamin D deficiency, unspecified: Secondary | ICD-10-CM

## 2014-06-29 DIAGNOSIS — R9431 Abnormal electrocardiogram [ECG] [EKG]: Secondary | ICD-10-CM

## 2014-06-29 DIAGNOSIS — Z1211 Encounter for screening for malignant neoplasm of colon: Secondary | ICD-10-CM

## 2014-06-29 DIAGNOSIS — R079 Chest pain, unspecified: Secondary | ICD-10-CM

## 2014-06-29 DIAGNOSIS — R5383 Other fatigue: Secondary | ICD-10-CM

## 2014-06-29 DIAGNOSIS — F1920 Other psychoactive substance dependence, uncomplicated: Secondary | ICD-10-CM

## 2014-06-29 DIAGNOSIS — Z96651 Presence of right artificial knee joint: Secondary | ICD-10-CM

## 2014-06-29 LAB — CBC WITH DIFFERENTIAL/PLATELET
Basophils Absolute: 0 10*3/uL (ref 0.0–0.1)
Basophils Relative: 1 % (ref 0–1)
Eosinophils Absolute: 0.4 10*3/uL (ref 0.0–0.7)
Eosinophils Relative: 8 % — ABNORMAL HIGH (ref 0–5)
HCT: 27.3 % — ABNORMAL LOW (ref 39.0–52.0)
Hemoglobin: 9 g/dL — ABNORMAL LOW (ref 13.0–17.0)
Lymphocytes Relative: 29 % (ref 12–46)
Lymphs Abs: 1.3 10*3/uL (ref 0.7–4.0)
MCH: 31.7 pg (ref 26.0–34.0)
MCHC: 33 g/dL (ref 30.0–36.0)
MCV: 96.1 fL (ref 78.0–100.0)
MPV: 9.6 fL (ref 8.6–12.4)
Monocytes Absolute: 0.5 10*3/uL (ref 0.1–1.0)
Monocytes Relative: 12 % (ref 3–12)
Neutro Abs: 2.3 10*3/uL (ref 1.7–7.7)
Neutrophils Relative %: 50 % (ref 43–77)
Platelets: 148 10*3/uL — ABNORMAL LOW (ref 150–400)
RBC: 2.84 MIL/uL — ABNORMAL LOW (ref 4.22–5.81)
RDW: 17.1 % — ABNORMAL HIGH (ref 11.5–15.5)
WBC: 4.5 10*3/uL (ref 4.0–10.5)

## 2014-06-29 LAB — TSH: TSH: 1.708 u[IU]/mL (ref 0.350–4.500)

## 2014-06-30 ENCOUNTER — Encounter: Payer: Self-pay | Admitting: Internal Medicine

## 2014-06-30 LAB — VITAMIN D 25 HYDROXY (VIT D DEFICIENCY, FRACTURES): Vit D, 25-Hydroxy: 15 ng/mL — ABNORMAL LOW (ref 30–100)

## 2014-06-30 NOTE — Progress Notes (Signed)
Patient ID: Mark [name], male   DOB: [birth year], 59 y.o.   MRN: [id number].  ANNUAL PREVENTATIVE VISIT AND CPE  Subjective:  Mark [name] is a 59 y.o. male who presents for  Annual Preventative Visit and complete physical.  Date of last preventative visit is unknown.  Pt continues to use Heroin on an every 3 day basis  As he cannot go [name] than 3 days without symptoms of withdrawal. He has refused rehab ib the past but is now willing to explore IOP. He complains of fatigue which is non-specific but renders him unable to enjoy activities as he had before.  Pt also has a complaint of inability to achieve or maintain an erection.   Pt also c/o pain in the right knee which is the knee on which he had a TKA performed in 2015.   He has not had elevated blood pressure. His blood pressure has been controlled at home, today their BP is BP: (!) 118/59 mmHg   Pt c/o non-specific pain in left chest. He is unable to characterize the pain and states that it occurs mostly with activity. His EKG shows changes consistent with a prior MI. He has had ECHO scheduled in the past but has not followed up with having them performed. He reports a previous history of CVA but the most recent MRI on record from 2011 is negative for evidence of CVA.   He does not workout. He denies chest pain, shortness of breath, dizziness.  He is not on cholesterol medication and denies myalgias. His cholesterol is at goal. The cholesterol last visit was:   Lab Results  Component Value Date   CHOL 84 12/01/2013   HDL 26* 12/01/2013   LDLCALC 46 12/01/2013   TRIG 58 12/01/2013   CHOLHDL 3.2 12/01/2013   He has not been diagnosed with diabetes. He has not been working on diet and exercise for prediabetes. Last A1C in the office was:  Lab Results  Component Value Date   HGBA1C  11/01/2009    5.1 (NOTE)                                                                       According to the ADA Clinical Practice Recommendations  for 2011, when HbA1c is used as a screening test:   >=6.5%   Diagnostic of Diabetes Mellitus           (if abnormal result  is confirmed)  5.7-6.4%   Increased risk of developing Diabetes Mellitus  References:Diagnosis and Classification of Diabetes Mellitus,Diabetes LFYB,0175,10(CHENI 1):S62-S69 and Standards of Medical Care in         Diabetes - 2011,Diabetes Care,2011,34  (Suppl 1):S11-S61.   Patient has discontinued his Vitamin D supplement.   Lab Results  Component Value Date   VD25OH 15* 06/29/2014       Names of Other Physician/Practitioners you currently use: 1. [facility] here for primary care 2. He does not have an eye doctor, and cannot recall his last visit.  3. He does not have a, dentist.  Patient Care Team: Leana Gamer, MD as PCP - General (Internal Medicine)   Medication Review: Current Outpatient Prescriptions on File Prior to Visit  Medication Sig  Dispense Refill  . furosemide (LASIX) 20 MG tablet take 1 tablet by mouth once daily 30 tablet 11  . nadolol (CORGARD) 20 MG tablet take 1 tablet by mouth once daily 30 tablet 11  . spironolactone (ALDACTONE) 50 MG tablet Take 2 tablets (100 mg total) by mouth 2 (two) times daily. 120 tablet 3  . ergocalciferol (VITAMIN D2) 50000 UNITS capsule Take 1 capsule (50,000 Units total) by mouth once a week. (Patient not taking: Reported on 06/29/2014) 4 capsule 1   No current facility-administered medications on file prior to visit.    Current Problems (verified) Patient Active Problem List   Diagnosis Date Noted  . H/O: CVA (cerebrovascular accident) 12/01/2013  . ACS (acute coronary syndrome) 12/01/2013  . Anasarca 12/01/2013  . Substance abuse 12/01/2013  . CKD (chronic kidney disease) stage 3, GFR 30-59 ml/min 12/01/2013  . S/P right TKA 05/02/2013  . Preop cardiovascular exam 04/14/2013  . Murmur 04/14/2013  . Right inguinal hernia 12/26/2010  . Cirrhosis, alcoholic 40/01/2724    Screening  Tests Health Maintenance  Topic Date Due  . HIV Screening  08/25/1968  . COLONOSCOPY  08/26/2003  . INFLUENZA VACCINE  07/27/2014 (Originally 11/26/2013)  . TETANUS/TDAP  12/06/2023  . ZOSTAVAX  Completed    Immunization History  Administered Date(s) Administered  . Hep A / Hep B 06/06/2011, 06/19/2011, 06/27/2011  . Tdap 12/05/2013  . Zoster 12/24/2013    Preventative care: Last colonoscopy:  Unsure of date. He thinks it was [name] than 10 years. DEXA:Never  Prior vaccinations:  Influenza: Declines  Pneumococcal: Declines     Medication List       This list is accurate as of: 06/29/14 11:59 PM.  Always use your most recent med list.               ergocalciferol 50000 UNITS capsule  Commonly known as:  VITAMIN D2  Take 1 capsule (50,000 Units total) by mouth once a week.     furosemide 20 MG tablet  Commonly known as:  LASIX  take 1 tablet by mouth once daily     nadolol 20 MG tablet  Commonly known as:  CORGARD  take 1 tablet by mouth once daily     spironolactone 50 MG tablet  Commonly known as:  ALDACTONE  Take 2 tablets (100 mg total) by mouth 2 (two) times daily.        Past Surgical History  Procedure Laterality Date  . Knee arthroscopy      bilateral/  12/14  . Hernia repair Right     inguinal  . Total knee arthroplasty Right 05/02/2013    Procedure: RIGHT TOTAL KNEE ARTHROPLASTY;  Surgeon: Mauri Pole, MD;  Location: [facility];  Service: Orthopedics;  Laterality: Right;   Family History  Problem Relation Age of Onset  . Hypertension Father   . Diabetes Father   . Dementia Mother   . Lupus Sister     History reviewed: allergies, current medications, past family history, past medical history, past social history, past surgical history and problem list   Risk Factors: Osteoporosis/FallRisk: None In the past year have you fallen or had a near fall?:No History of fracture in the past year: no  Tobacco History  Substance Use Topics  .  Smoking status: Current Every Day Smoker -- 0.50 packs/day for 10 years    Types: Cigarettes  . Smokeless tobacco: Never Used     Comment: 4 cigarettes a day  . Alcohol Use:  Yes     Comment:  beer  80 oz  week   He does smoke.  Are there smokers in your home (other than you)?  Yes  Alcohol Current alcohol use: social drinker  Caffeine Current caffeine use: denies use  Exercise Current exercise: none  Nutrition/Diet Current diet: in general, an "unhealthy" diet  Cardiac risk factors: advanced age (older than 58 for men, 71 for women), male gender, smoking/ tobacco exposure, prior cardiac event, and Heroin use.  Depression Screen (Note: if answer to either of the following is "Yes", a [name] complete depression screening is indicated)   Q1: Over the past two weeks, have you felt down, depressed or hopeless? No  Q2: Over the past two weeks, have you felt little interest or pleasure in doing things? No  Have you lost interest or pleasure in daily life? No  Do you often feel hopeless? No  Do you cry easily over simple problems? No  Activities of Daily Living In your present state of health, do you have any difficulty performing the following activities?:  Driving? No Managing money?  No Feeding yourself? No Getting from bed to chair? No Climbing a flight of stairs? No Preparing food and eating?: No Bathing or showering? No Getting dressed: No Getting to the toilet? No Using the toilet:No Moving around from place to place: No In the past year have you fallen or had a near fall?:No   Are you sexually active?  Yes  Do you have [name] than one partner?  Yes  Vision Difficulties: No  Hearing Difficulties: No Do you often ask people to speak up or repeat themselves? No Do you experience ringing or noises in your ears? No Do you have difficulty understanding soft or whispered voices? No  Cognition  Do you feel that you have a problem with memory?No  Do you often misplace  items? No  Do you feel safe at home?  Yes  Advanced directives Does patient have a [place]? No Does patient have a Living Will? No   Objective:     Blood pressure 118/59, pulse 72, temperature 98 F (36.7 C), temperature source Oral, resp. rate 16, height 5\' 7"  (1.702 m), weight 164 lb (74.39 kg). Body mass index is 25.68 kg/(m^2).  General appearance: alert, no distress, WD/WN, male Cognitive Testing  Alert? Yes  Normal Appearance?Yes  Oriented to person? Yes  Place? Yes   Time? Yes  Recall of three objects?  Yes  Can perform simple calculations? Yes  Displays appropriate judgment?Yes  Can read the correct time from a watch face?Yes  HEENT: normocephalic, sclerae anicteric, TMs pearly, nares patent, no discharge or erythema, pharynx normal Oral cavity: MMM, no lesions Neck: supple, no lymphadenopathy, no thyromegaly, no masses Heart: RRR, normal S1, S2, no murmurs Lungs: CTA bilaterally, no wheezes, rhonchi, or rales Abdomen: +bs, soft, non tender, non distended, no masses, no hepatomegaly, no splenomegaly Musculoskeletal: nontender, no swelling, no obvious deformity Extremities: no edema, no cyanosis, no clubbing Pulses: 2+ symmetric, upper and lower extremities, normal cap refill Neurological: alert, oriented x 3, CN2-12 intact, strength normal upper extremities and lower extremities, sensation normal throughout, DTRs 2+ throughout, no cerebellar signs, gait normal Psychiatric: normal affect, behavior normal, pleasant   Assessment:  Patient denies any difficulties at home. No trouble with ADLs, depression or falls. No recent changes to vision or hearing. Is UTD with immunizations. Is UTD with screening. Discussed Advanced Directives, patient agrees to bring [place] copies of  documents if can. Encouraged heart healthy diet, exercise as tolerated and adequate sleep. Declines flu shot. Pap smear done today.   1. Annual physical exam -  Colon cancer  screening - Ambulatory referral to Gastroenterology  2. Other fatigue - Could certainly be related to Heroin use and withdrawal symptoms, but will evaluate for [name] common reasons - TSH - CBC with Differential/Platelet  3.  Vitamin D deficiency -  Pt has discontinued his Drisdol. Will check levels and then proceed accordingly. - Vit D  25 hydroxy (rtn osteoporosis monitoring)  4 Nonspecific abnormal electrocardiogram (ECG) (EKG) - Ambulatory referral to Cardiology  6. Chest pain with high risk of acute coronary syndrome  - Ambulatory referral to Cardiology  5. Other male erectile dysfunction - Will re-evaluate for Viagra use after seen by Cardiology  7.  Dependent drug abuse - Referral made to Alcohol and Drug Services  8. Right knee pain - Pt with c/o pain in TKA knee. - Ambulatory referral to Orthopedic Surgery     Plan:   During the course of the visit the patient was educated and counseled about appropriate screening and preventive services including:    Pneumococcal vaccine   Influenza vaccine  Td vaccine  Screening electrocardiogram  Bone densitometry screening  Colorectal cancer screening  Diabetes screening  Glaucoma screening  Nutrition counseling   Advanced directives: requested  Screening recommendations, referrals: Vaccinations: Please see documentation below and orders this visit.  Nutrition assessed and recommended  Colonoscopy ordered Recommended yearly ophthalmology/optometry visit for glaucoma screening and checkup Recommended yearly dental visit for hygiene and checkup Advanced directives - requested  Conditions/risks identified: BMI: Discussed weight loss, diet, and increase physical activity.  Increase physical activity: AHA recommends 150 minutes of physical activity a week.  Medications reviewed Urinary Incontinence is not an issue: discussed non pharmacology and pharmacology options.  Fall risk: low.    Medicare  Attestation I have personally reviewed: The patient's medical and social history Their use of alcohol, tobacco or illicit drugs Their current medications and supplements The patient's functional ability including ADLs,fall risks, home safety risks, cognitive, and hearing and visual impairment Diet and physical activities Evidence for depression or mood disorders  The patient's weight, height, BMI, and visual acuity have been recorded in the chart.  I have made referrals, counseling, and provided education to the patient based on review of the above and I have provided the patient with a written personalized care plan for preventive services.     Arriana Lohmann A., MD   06/30/2014

## 2014-07-03 NOTE — Progress Notes (Signed)
Patient ID: Mark [name], male   DOB: [birth year], 59 y.o.   MRN: [id number] Reviewed labs for Mark [name] and noted a normocytic-normochromic anemia with Hb of 9.0. This is likely secondary to an anemia of chronic disease

## 2014-07-04 ENCOUNTER — Telehealth: Payer: Self-pay

## 2014-07-04 NOTE — Telephone Encounter (Signed)
Spoke with patient and gave number for Roselyn Reef [phone] ext [phone]) with alcohol and drug services. Patient is to call and set up an appointment to discuss outpatient rehab. Patient verbalizes understanding. Thanks!

## 2014-07-31 ENCOUNTER — Encounter: Payer: Self-pay | Admitting: Gastroenterology

## 2014-08-10 ENCOUNTER — Encounter: Payer: Self-pay | Admitting: Internal Medicine

## 2014-08-22 NOTE — Progress Notes (Signed)
Patient ID: Mark [name], male   DOB: 09/22/1955, 59 y.o.   MRN: [id number]   59 y.o. initially seen 2014 for preop clearance knee surgery .  Has chronic alcoholic liver disease with cirrhosis and portal hypertension Previous anasarca and thrombocytopenia.  Sees Dr Yates Decamp aldactone, nadolol and lasix for liver disease INR is elevatred at 1.6 and PlT count around 127  Still drinking and smoking  Injured knee at work about 8 months ago on right and failed conservative Rx  Has palpitations at night No syncope No chest pain or dyspnea.  Seen by Dr Leonia Reeves in 2011 for question of SVT  Echo done 7/11 with normal EF 65% and no valve disease.    One episode of rapid palpitations woke him from sleep  LE edema chronic   ROS: Denies fever, malais, weight loss, blurry vision, decreased visual acuity, cough, sputum, SOB, hemoptysis, pleuritic pain, palpitaitons, heartburn, abdominal pain, melena, lower extremity edema, claudication, or rash.  All other systems reviewed and negative   General: Affect appropriate Disheveled black male  HEENT: Muddy sclera  Neck supple with no adenopathy JVP normal no bruits no thyromegaly Lungs clear with no wheezing and good diaphragmatic motion Heart:  S1/S2 SEM  murmur,rub, gallop or click PMI normal Abdomen: benighn, BS positve, no tenderness, no AAA no bruit.  No HSM or HJR Distal pulses intact with no bruits No edema Neuro non-focal Skin warm and dry No muscular weakness  Medications Current Outpatient Prescriptions  Medication Sig Dispense Refill  . ergocalciferol (VITAMIN D2) 50000 UNITS capsule Take 1 capsule (50,000 Units total) by mouth once a week. (Patient not taking: Reported on 06/29/2014) 4 capsule 1  . furosemide (LASIX) 20 MG tablet take 1 tablet by mouth once daily 30 tablet 11  . nadolol (CORGARD) 20 MG tablet take 1 tablet by mouth once daily 30 tablet 11  . spironolactone (ALDACTONE) 50 MG tablet Take 2 tablets (100 mg total) by mouth 2  (two) times daily. 120 tablet 3   No current facility-administered medications for this visit.    Allergies Review of patient's allergies indicates no known allergies.  Family History: Family History  Problem Relation Age of Onset  . Hypertension Father   . Diabetes Father   . Dementia Mother   . Lupus Sister     Social History: History   Social History  . Marital Status: Married    Spouse Name: N/A  . Number of Children: 4  . 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 Smoking status: Current Every Day Smoker -- 0.50 packs/day for 10 years    Types: Cigarettes  . Smokeless tobacco: Never Used     Comment: 4 cigarettes a day  . Alcohol Use: Yes     Comment:  beer  80 oz  week  . Drug Use: Yes    Special: Marijuana, Heroin     Comment: cocaine 6 months ago  . Sexual Activity: Not on file     Comment: now and then smokies marijuana   Other Topics Concern  . Not on file   Social History Narrative    Electrocardiogram:  10/12  SR rate 68 normal   Today NSR rate 65 normal except T wave inversion in lead 3    Assessment and Plan Edema:  Related to cirrhosis  Increase lasix 20 bid continue aldactone  BMET in 3 weeks Murmur:  F/u echo SEM aortic area Cirrhosis:  F/u Ardis Hughs  Continue aldactone  Follow [facility]

## 2014-08-23 ENCOUNTER — Encounter: Payer: Self-pay | Admitting: Cardiovascular Disease

## 2014-08-23 ENCOUNTER — Ambulatory Visit (INDEPENDENT_AMBULATORY_CARE_PROVIDER_SITE_OTHER): Payer: BC Managed Care – PPO | Admitting: Cardiovascular Disease

## 2014-08-23 VITALS — BP 100/46 | HR 74 | Ht 68.0 in | Wt 158.0 lb

## 2014-08-23 DIAGNOSIS — Z79899 Other long term (current) drug therapy: Secondary | ICD-10-CM

## 2014-08-23 DIAGNOSIS — R609 Edema, unspecified: Secondary | ICD-10-CM | POA: Diagnosis not present

## 2014-08-23 DIAGNOSIS — R011 Cardiac murmur, unspecified: Secondary | ICD-10-CM

## 2014-08-23 MED ORDER — FUROSEMIDE 20 MG PO TABS: 20.0000 mg | ORAL_TABLET | Freq: Two times a day (BID) | ORAL | Status: DC

## 2014-08-23 NOTE — Patient Instructions (Addendum)
Medication Instructions:  INCREASE  FUROSEMIDE  TO 20 MG  TWICE  DAILY   Labwork: BMET   IN 3 WEEKS  Testing/Procedures: Your physician has requested that you have an echocardiogram. Echocardiography is a painless test that uses sound waves to create images of your heart. It provides your doctor with information about the size and shape of your heart and how well your heart's chambers and valves are working. This procedure takes approximately one hour. There are no restrictions for this procedure.   Follow-Up: Your physician recommends that you schedule a follow-up appointment in: [place] Any Other Special Instructions Will Be Listed Below (If Applicable). \

## 2014-08-30 ENCOUNTER — Ambulatory Visit (HOSPITAL_COMMUNITY): Payer: BC Managed Care – PPO | Attending: Cardiovascular Disease

## 2014-08-30 ENCOUNTER — Other Ambulatory Visit: Payer: Self-pay

## 2014-08-30 DIAGNOSIS — R011 Cardiac murmur, unspecified: Secondary | ICD-10-CM | POA: Insufficient documentation

## 2014-08-30 DIAGNOSIS — Z720 Tobacco use: Secondary | ICD-10-CM | POA: Insufficient documentation

## 2014-08-30 DIAGNOSIS — R609 Edema, unspecified: Secondary | ICD-10-CM

## 2014-09-06 ENCOUNTER — Other Ambulatory Visit (INDEPENDENT_AMBULATORY_CARE_PROVIDER_SITE_OTHER): Payer: BC Managed Care – PPO

## 2014-09-06 DIAGNOSIS — Z79899 Other long term (current) drug therapy: Secondary | ICD-10-CM | POA: Diagnosis not present

## 2014-09-06 LAB — BASIC METABOLIC PANEL
BUN: 12 mg/dL (ref 6–23)
CO2: 22 mEq/L (ref 19–32)
Calcium: 7.4 mg/dL — ABNORMAL LOW (ref 8.4–10.5)
Chloride: 103 mEq/L (ref 96–112)
Creatinine, Ser: 1.17 mg/dL (ref 0.40–1.50)
GFR: 81.5 mL/min (ref >60.00)
Glucose, Bld: 101 mg/dL — ABNORMAL HIGH (ref 70–99)
Potassium: 4.3 mEq/L (ref 3.5–5.1)
Sodium: 128 mEq/L — ABNORMAL LOW (ref 135–145)

## 2014-09-11 ENCOUNTER — Telehealth: Payer: Self-pay | Admitting: Cardiovascular Disease

## 2014-09-11 DIAGNOSIS — E871 Hypo-osmolality and hyponatremia: Secondary | ICD-10-CM

## 2014-09-11 NOTE — Telephone Encounter (Signed)
LMTCB ./CY 

## 2014-09-11 NOTE — Telephone Encounter (Signed)
Follow Up       Pt returning Mark [name]'s call for lab results.

## 2014-09-13 NOTE — Telephone Encounter (Signed)
Follow Up  Pt returning Christine's phone call.  

## 2014-09-13 NOTE — Telephone Encounter (Signed)
PT AWARE OF LAB RESULTS./CY 

## 2014-10-16 ENCOUNTER — Other Ambulatory Visit (INDEPENDENT_AMBULATORY_CARE_PROVIDER_SITE_OTHER): Payer: BC Managed Care – PPO | Admitting: *Deleted

## 2014-10-16 DIAGNOSIS — E871 Hypo-osmolality and hyponatremia: Secondary | ICD-10-CM | POA: Diagnosis not present

## 2014-10-16 LAB — BASIC METABOLIC PANEL
BUN: 11 mg/dL (ref 6–23)
CO2: 24 mEq/L (ref 19–32)
Calcium: 7.8 mg/dL — ABNORMAL LOW (ref 8.4–10.5)
Chloride: 106 mEq/L (ref 96–112)
Creatinine, Ser: 1.07 mg/dL (ref 0.40–1.50)
GFR: 90.32 mL/min (ref >60.00)
Glucose, Bld: 83 mg/dL (ref 70–99)
Potassium: 4.1 mEq/L (ref 3.5–5.1)
Sodium: 133 mEq/L — ABNORMAL LOW (ref 135–145)

## 2014-10-16 NOTE — Addendum Note (Signed)
Addended by: Eulis Foster on: 10/16/2014 08:57 AM   Modules accepted: Orders

## 2014-11-22 ENCOUNTER — Other Ambulatory Visit: Payer: Self-pay | Admitting: Gastroenterology

## 2014-12-11 ENCOUNTER — Ambulatory Visit: Payer: Self-pay | Admitting: Cardiovascular Disease

## 2014-12-19 NOTE — Progress Notes (Signed)
Patient ID: Mark [name], male   DOB: 04-26-1956, 59 y.o.   MRN: [id number]   59 y.o. initially seen 2014 for preop clearance knee surgery .  Has chronic alcoholic liver disease with cirrhosis and portal hypertension Previous anasarca and thrombocytopenia.  Sees Dr Yates Decamp aldactone, nadolol and lasix for liver disease INR is elevatred at 1.6 and PlT count around 127  Still drinking and smoking  Injured knee at work about 8 months ago on right and failed conservative Rx  Has palpitations at night No syncope No chest pain or dyspnea.  Seen by Dr Leonia Reeves in 2011 for question of SVT  Echo done 7/11 with normal EF 65% and no valve disease.    One episode of rapid palpitations woke him from sleep  LE edema chronic   Echo 08/30/14 reviewed  Study Conclusions  - Left ventricle: The cavity size was normal. Wall thickness was normal. Systolic function was normal. The estimated ejection fraction was in the range of 60% to 65%. Wall motion was normal; there were no regional wall motion abnormalities. - Mitral valve: There was mild regurgitation.  ROS: Denies fever, malais, weight loss, blurry vision, decreased visual acuity, cough, sputum, SOB, hemoptysis, pleuritic pain, palpitaitons, heartburn, abdominal pain, melena, lower extremity edema, claudication, or rash.  All other systems reviewed and negative   General: Affect appropriate Disheveled black male  HEENT: Muddy sclera  Neck supple with no adenopathy JVP normal no bruits no thyromegaly Lungs clear with no wheezing and good diaphragmatic motion Heart:  S1/S2 SEM  murmur,rub, gallop or click PMI normal Abdomen: benighn, BS positve, no tenderness, no AAA no bruit.  No HSM or HJR Distal pulses intact with no bruits No edema Neuro non-focal Skin warm and dry No muscular weakness  Medications Current Outpatient Prescriptions  Medication Sig Dispense Refill  . ergocalciferol (VITAMIN D2) 50000 UNITS capsule Take 1 capsule (50,000  Units total) by mouth once a week. 4 capsule 1  . furosemide (LASIX) 20 MG tablet Take 1 tablet (20 mg total) by mouth 2 (two) times daily. 60 tablet 11  . HYDROcodone-acetaminophen (NORCO/VICODIN) 5-325 MG per tablet Take 5-325 tablets by mouth as needed. For pain  0  . ibuprofen (ADVIL,MOTRIN) 600 MG tablet Take 600 mg by mouth as needed. For pain  0  . nadolol (CORGARD) 20 MG tablet take 1 tablet by mouth once daily 30 tablet 11  . spironolactone (ALDACTONE) 50 MG tablet take 2 tablets by mouth twice a day 120 tablet 0   No current facility-administered medications for this visit.    Allergies Review of patient's allergies indicates no known allergies.  Family History: Family History  Problem Relation Age of Onset  . Hypertension Father   . Diabetes Father   . Dementia Mother   . Lupus Sister     Social History: Social History   Social History  . Marital Status: Married    Spouse Name: N/A  . Number of Children: 4  . 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 Smoking status: Current Every Day Smoker -- 0.50 packs/day for 10 years    Types: Cigarettes  . Smokeless tobacco: Never Used     Comment: 4 cigarettes a day  . Alcohol Use: Yes     Comment:  beer  80 oz  week  . Drug Use: Yes    Special: Marijuana, Heroin  Comment: cocaine 6 months ago  . Sexual Activity: Not on file     Comment: now and then smokies marijuana   Other Topics Concern  . Not on file   Social History Narrative    Electrocardiogram:  10/12  SR rate 68 normal   Today NSR rate 65 normal except T wave inversion in lead 3    Assessment and Plan Edema:  Related to cirrhosis  Better on higher dose aldactone/lasix Murmur:  Mild MR on recent echo  Fu echo in a year  Cirrhosis:  F/u Ardis Hughs  Continue aldactone  Follow LFTls   Smoking:  Counseled on cessation little motivation to quit  CXR today has not had one in close to 2  years

## 2014-12-20 ENCOUNTER — Ambulatory Visit (INDEPENDENT_AMBULATORY_CARE_PROVIDER_SITE_OTHER): Payer: BC Managed Care – PPO | Admitting: Cardiovascular Disease

## 2014-12-20 ENCOUNTER — Encounter: Payer: Self-pay | Admitting: Cardiovascular Disease

## 2014-12-20 VITALS — BP 114/56 | HR 76 | Ht 68.0 in | Wt 167.2 lb

## 2014-12-20 DIAGNOSIS — R011 Cardiac murmur, unspecified: Secondary | ICD-10-CM

## 2014-12-20 DIAGNOSIS — Z720 Tobacco use: Secondary | ICD-10-CM | POA: Diagnosis not present

## 2014-12-20 DIAGNOSIS — R0602 Shortness of breath: Secondary | ICD-10-CM

## 2014-12-20 DIAGNOSIS — F17200 Nicotine dependence, unspecified, uncomplicated: Secondary | ICD-10-CM

## 2014-12-20 NOTE — Patient Instructions (Signed)
Medication Instructions:  NO CHANGES  Labwork: NONE  Testing/Procedures: A chest x-ray takes a picture of the organs and structures inside the chest, including the heart, lungs, and blood vessels. This test can show several things, including, whether the heart is enlarges; whether fluid is building up in the lungs; and whether pacemaker / defibrillator leads are still in place.  Follow-Up: Your physician wants you to follow-up in: [place] will receive a reminder letter in the mail two months in advance. If you don't receive a letter, please call our office to schedule the follow-up appointment. Any Other Special Instructions Will Be Listed Below (If Applicable).

## 2014-12-20 NOTE — Addendum Note (Signed)
Addended by: Devra Dopp E on: 12/20/2014 10:58 AM   Modules accepted: Orders

## 2015-01-15 ENCOUNTER — Encounter: Payer: Self-pay | Admitting: Gastroenterology

## 2015-01-27 ENCOUNTER — Other Ambulatory Visit: Payer: Self-pay | Admitting: Gastroenterology

## 2015-02-08 ENCOUNTER — Other Ambulatory Visit: Payer: Self-pay | Admitting: Gastroenterology

## 2015-02-13 ENCOUNTER — Other Ambulatory Visit: Payer: Self-pay | Admitting: Gastroenterology

## 2015-02-14 NOTE — Telephone Encounter (Signed)
Ok to refill until apt with you? Patient last OV eas on 02-09-14 and he was to return in 6 months for OV. Thank you

## 2015-02-14 NOTE — Telephone Encounter (Signed)
Pt aware that rx was sent to his pharmacy and follow up apt made for 03-2815 at 11:00am

## 2015-04-17 ENCOUNTER — Other Ambulatory Visit: Payer: Self-pay | Admitting: Gastroenterology

## 2015-04-17 NOTE — Telephone Encounter (Signed)
Pt was last seen in clinic on 02-13-2014. He has appointment to see you on 04-25-15. Would you like to fill his Aldactone 100mg  a day. Please advise. Thank you

## 2015-04-25 ENCOUNTER — Ambulatory Visit: Payer: Self-pay | Admitting: Gastroenterology

## 2015-06-06 ENCOUNTER — Other Ambulatory Visit: Payer: Self-pay | Admitting: Internal Medicine

## 2015-06-07 ENCOUNTER — Other Ambulatory Visit: Payer: Self-pay

## 2015-06-07 MED ORDER — SPIRONOLACTONE 50 MG PO TABS: 100.0000 mg | ORAL_TABLET | Freq: Two times a day (BID) | ORAL | Status: DC

## 2015-06-22 ENCOUNTER — Ambulatory Visit: Payer: Self-pay | Admitting: Gastroenterology

## 2015-08-06 ENCOUNTER — Telehealth: Payer: Self-pay | Admitting: Gastroenterology

## 2015-08-06 MED ORDER — SPIRONOLACTONE 50 MG PO TABS: 100.0000 mg | ORAL_TABLET | Freq: Two times a day (BID) | ORAL | Status: DC

## 2015-08-06 NOTE — Telephone Encounter (Signed)
NO further refills without appt

## 2015-08-28 ENCOUNTER — Other Ambulatory Visit: Payer: Self-pay

## 2015-08-28 ENCOUNTER — Other Ambulatory Visit: Payer: Self-pay | Admitting: Cardiovascular Disease

## 2015-08-28 MED ORDER — FUROSEMIDE 20 MG PO TABS: 20.0000 mg | ORAL_TABLET | Freq: Two times a day (BID) | ORAL | Status: DC

## 2015-09-18 ENCOUNTER — Other Ambulatory Visit: Payer: Self-pay

## 2015-09-18 ENCOUNTER — Telehealth: Payer: Self-pay | Admitting: Gastroenterology

## 2015-09-18 MED ORDER — SPIRONOLACTONE 50 MG PO TABS: 100.0000 mg | ORAL_TABLET | Freq: Two times a day (BID) | ORAL | Status: DC

## 2015-09-18 NOTE — Telephone Encounter (Signed)
A user error has taken place.

## 2015-10-05 ENCOUNTER — Other Ambulatory Visit: Payer: Self-pay

## 2015-10-05 ENCOUNTER — Telehealth: Payer: Self-pay | Admitting: Gastroenterology

## 2015-10-05 ENCOUNTER — Ambulatory Visit: Payer: Self-pay | Admitting: Gastroenterology

## 2015-10-05 DIAGNOSIS — E559 Vitamin D deficiency, unspecified: Secondary | ICD-10-CM

## 2015-10-05 MED ORDER — PIMECROLIMUS 1 % EX CREA
TOPICAL_CREAM | Freq: Two times a day (BID) | CUTANEOUS | Status: DC
Start: 2015-10-05 — End: 2015-10-18

## 2015-10-05 NOTE — Telephone Encounter (Signed)
Pt scheduled to see Amy Esterwood PA 10/18/15@11am . Please notify pt of appt.

## 2015-10-05 NOTE — Telephone Encounter (Signed)
Patient is aware of appointment °

## 2015-10-18 ENCOUNTER — Other Ambulatory Visit (INDEPENDENT_AMBULATORY_CARE_PROVIDER_SITE_OTHER): Payer: BC Managed Care – PPO

## 2015-10-18 ENCOUNTER — Encounter: Payer: Self-pay | Admitting: Physician Assistant

## 2015-10-18 ENCOUNTER — Ambulatory Visit (INDEPENDENT_AMBULATORY_CARE_PROVIDER_SITE_OTHER): Payer: BC Managed Care – PPO | Admitting: Physician Assistant

## 2015-10-18 VITALS — BP 100/50 | HR 84 | Ht 66.0 in | Wt 172.4 lb

## 2015-10-18 DIAGNOSIS — K7030 Alcoholic cirrhosis of liver without ascites: Secondary | ICD-10-CM

## 2015-10-18 DIAGNOSIS — I8510 Secondary esophageal varices without bleeding: Secondary | ICD-10-CM | POA: Diagnosis not present

## 2015-10-18 DIAGNOSIS — R609 Edema, unspecified: Secondary | ICD-10-CM

## 2015-10-18 LAB — COMPREHENSIVE METABOLIC PANEL
ALT: 10 U/L (ref 0–53)
AST: 21 U/L (ref 0–37)
Albumin: 2 g/dL — ABNORMAL LOW (ref 3.5–5.2)
Alkaline Phosphatase: 147 U/L — ABNORMAL HIGH (ref 39–117)
BUN: 6 mg/dL (ref 6–23)
CO2: 27 mEq/L (ref 19–32)
Calcium: 7.5 mg/dL — ABNORMAL LOW (ref 8.4–10.5)
Chloride: 109 mEq/L (ref 96–112)
Creatinine, Ser: 0.89 mg/dL (ref 0.40–1.50)
GFR: 111.34 mL/min (ref >60.00)
Glucose, Bld: 109 mg/dL — ABNORMAL HIGH (ref 70–99)
Potassium: 3.7 mEq/L (ref 3.5–5.1)
Sodium: 139 mEq/L (ref 135–145)
Total Bilirubin: 0.9 mg/dL (ref 0.2–1.2)
Total Protein: 6.3 g/dL (ref 6.0–8.3)

## 2015-10-18 LAB — CBC WITH DIFFERENTIAL/PLATELET
Basophils Absolute: 0 10*3/uL (ref 0.0–0.1)
Basophils Relative: 1.2 % (ref 0.0–3.0)
Eosinophils Absolute: 0.3 10*3/uL (ref 0.0–0.7)
Eosinophils Relative: 7.2 % — ABNORMAL HIGH (ref 0.0–5.0)
HCT: 20.9 % — CL (ref 39.0–52.0)
Hemoglobin: 6.7 g/dL — CL (ref 13.0–17.0)
Lymphocytes Relative: 23 % (ref 12.0–46.0)
Lymphs Abs: 0.8 10*3/uL (ref 0.7–4.0)
MCHC: 31.8 g/dL (ref 30.0–36.0)
MCV: 78.3 fl (ref 78.0–100.0)
Monocytes Absolute: 0.5 10*3/uL (ref 0.1–1.0)
Monocytes Relative: 13.6 % — ABNORMAL HIGH (ref 3.0–12.0)
Neutro Abs: 1.9 10*3/uL (ref 1.4–7.7)
Neutrophils Relative %: 55 % (ref 43.0–77.0)
Platelets: 137 10*3/uL — ABNORMAL LOW (ref 150.0–400.0)
RBC: 2.67 Mil/uL — ABNORMAL LOW (ref 4.22–5.81)
RDW: 19.9 % — ABNORMAL HIGH (ref 11.5–15.5)
WBC: 3.5 10*3/uL — ABNORMAL LOW (ref 4.0–10.5)

## 2015-10-18 LAB — PROTIME-INR
INR: 1.6 ratio — ABNORMAL HIGH (ref 0.8–1.0)
Prothrombin Time: 17 s — ABNORMAL HIGH (ref 9.6–13.1)

## 2015-10-18 MED ORDER — FUROSEMIDE 20 MG PO TABS: 20.0000 mg | ORAL_TABLET | Freq: Two times a day (BID) | ORAL | Status: DC

## 2015-10-18 MED ORDER — SPIRONOLACTONE 50 MG PO TABS: 100.0000 mg | ORAL_TABLET | Freq: Two times a day (BID) | ORAL | Status: DC

## 2015-10-18 MED ORDER — NADOLOL 20 MG PO TABS: 20.0000 mg | ORAL_TABLET | Freq: Every day | ORAL | Status: DC

## 2015-10-18 NOTE — Progress Notes (Signed)
i agree with the above note, plan 

## 2015-10-18 NOTE — Patient Instructions (Addendum)
Please go to the basement level to have your labs drawn.  Stay on a 2 gram sodium diet.   You have been scheduled for an endoscopy. Please follow written instructions given to you at your visit today. If you use inhalers (even only as needed), please bring them with you on the day of your procedure. Your physician has requested that you go to www.startemmi.com and enter the access code given to you at your visit today. This web site gives a general overview about your procedure. However, you should still follow specific instructions given to you by our office regarding your preparation for the procedure.  You have been scheduled for an abdominal ultrasound at [facility] (1st floor of hospital) on Thursday 10-25-2015 at 11:00 am. Please arrive at 10:45 am to your appointment for registration. Make certain not to have anything to eat or drink 6 hours prior to your appointment. Nothing pat 4:00 am. Should you need to reschedule your appointment, please contact radiology at [phone]. This test typically takes about 30 minutes to perform.  We are sending refills for Aldactone 50 mg, Lasix 40 mg, and Nadaolo 20 mg to [place].     If you are age 46 or younger, your body mass index should be between 19-25. Your Body mass index is 27.84 kg/(m^2). If this is out of the aformentioned range listed, please consider follow up with your Primary Care Provider.

## 2015-10-18 NOTE — Progress Notes (Signed)
Patient ID: Mark [name], male   DOB: 1955/12/22, 60 y.o.   MRN: [id number]   Subjective:    Patient ID: Mark [name], male    DOB: 17-Aug-1955, 60 y.o.   MRN: [id number]  HPI Mark [name] is a 60 year old African-American male known to Dr. Ardis Hughs who has a diagnosis of alcohol-induced cirrhosis, he has had a prior CVA and has stage III chronic kidney disease. He was referred by Liston Alba today for abdominal swelling but the patient and his wife state that he is primarily here for med refills.. Patient's wife state that he does generally take his medications on a daily basis but usually skips at least 1 dose of the Aldactone. He has no complaints, but his wife is concerned about significant swelling in his legs which is been chronic. His appetite is been fine he denies any abdominal pain, he believes his weight has been stable. When asked about continued alcohol use his wife nods yes,and he says not every day and that he generally drinks beer but is not specific about how much. Last office visit was in 2015. Ultrasound at that time showed multiple gallstones a cirrhotic liver and a small amount of ascites. He has not had any recent labs.  Review of Systems Pertinent positive and negative review of systems were noted in the above HPI section.  All other review of systems was otherwise negative.  Outpatient Encounter Prescriptions as of 10/18/2015  Medication Sig  . furosemide (LASIX) 20 MG tablet Take 1 tablet (20 mg total) by mouth 2 (two) times daily.  . nadolol (CORGARD) 20 MG tablet Take 1 tablet (20 mg total) by mouth daily.  Marland Kitchen spironolactone (ALDACTONE) 50 MG tablet Take 2 tablets (100 mg total) by mouth 2 (two) times daily.  . [DISCONTINUED] furosemide (LASIX) 20 MG tablet Take 1 tablet (20 mg total) by mouth 2 (two) times daily.  . [DISCONTINUED] nadolol (CORGARD) 20 MG tablet take 1 tablet by mouth once daily  . [DISCONTINUED] spironolactone (ALDACTONE) 50 MG tablet Take 2 tablets (100  mg total) by mouth 2 (two) times daily.  . [DISCONTINUED] ergocalciferol (VITAMIN D2) 50000 UNITS capsule Take 1 capsule (50,000 Units total) by mouth once a week.  . [DISCONTINUED] pimecrolimus (ELIDEL) 1 % cream Apply topically 2 (two) times daily.   No facility-administered encounter medications on file as of 10/18/2015.   No Known Allergies Patient Active Problem List   Diagnosis Date Noted  . H/O: CVA (cerebrovascular accident) 12/01/2013  . ACS (acute coronary syndrome) ([place]) 12/01/2013  . Anasarca 12/01/2013  . Substance abuse 12/01/2013  . CKD (chronic kidney disease) stage 3, GFR 30-59 ml/min 12/01/2013  . S/P right TKA 05/02/2013  . Preop cardiovascular exam 04/14/2013  . Murmur 04/14/2013  . Right inguinal hernia 12/26/2010  . Cirrhosis, alcoholic ([place]) [id number]   Social History   Social History  . Marital Status: Married    Spouse Name: N/A  . Number of Children: 4  . 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 Smoking status: Current Every Day Smoker -- 0.50 packs/day for 10 years    Types: Cigarettes  . Smokeless tobacco: Never Used     Comment: 4 cigarettes a day  . Alcohol Use: Yes     Comment:  beer  80 oz  week  . Drug Use: Yes    Special: Marijuana, Heroin     Comment: cocaine  6 months ago  . Sexual Activity: Not on file     Comment: now and then smokies marijuana   Other Topics Concern  . Not on file   Social History Narrative    Mr. Kawada family history includes Dementia in his mother; Diabetes in his father; Hypertension in his father; Lupus in his sister.      Objective:    Filed Vitals:   10/18/15 1053  BP: 100/50  Pulse: 84    Physical Exam  well-developed African-American male in no acute distress, accompanied by his wife. Blood pressure 100/50 pulse 84 weight 172, BMI 27. HEENT ;nontraumatic normocephalic EOMI PERRLA sclera anicteric, Cardiovascular;  regular rate and rhythm with S1-S2 no murmur or gallop, Pulmonary; clear bilaterally, Abdome; no appreciable fluid wave nontender no palpable mass or hepatosplenomegaly bowel sounds are present, Rectal ;exam not done, Extremities ;he has 2+ edema to the knees and 1+ edema in his anterior thighs, Neuropsych; mood and affect appropriate       Assessment & Plan:   #58 60 year old male with alcoholic cirrhosis-decompensated with peripheral edema, previously documented small varices, and portal gastropathy (EGD 12/2010) #2 chronic kidney disease stage III #3 history of CVA  Plan; long discussion with patient and his wife today regarding management of his alcoholic liver disease. Stop EtOH 2 g sodium diet was again reviewed in detail and patient was given a copy of a 2 g sodium diet and importance of this was emphasized Increase Lasix to 40 mg by mouth every morning Continue Aldactone 50 mg 2 by mouth twice a day Continue Inadolol 20 mg by mouth every morning, CBC, CMET, AFP, and PT/INR Schedule for upper abdominal ultrasound Schedule for EGD for variceal screening Schedule return office visit with Dr. Ardis Hughs in 3-4 weeks.    Carli Lefevers S Lerline Valdivia PA-C 10/18/2015   Cc: Leana Gamer, MD

## 2015-10-19 ENCOUNTER — Other Ambulatory Visit: Payer: Self-pay

## 2015-10-19 DIAGNOSIS — D649 Anemia, unspecified: Secondary | ICD-10-CM

## 2015-10-19 LAB — AFP TUMOR MARKER: AFP-Tumor Marker: 2.9 ng/mL (ref <6.1)

## 2015-10-23 ENCOUNTER — Other Ambulatory Visit: Payer: Self-pay

## 2015-10-23 ENCOUNTER — Encounter: Payer: Self-pay | Admitting: Gastroenterology

## 2015-10-23 ENCOUNTER — Other Ambulatory Visit (INDEPENDENT_AMBULATORY_CARE_PROVIDER_SITE_OTHER): Payer: BC Managed Care – PPO

## 2015-10-23 DIAGNOSIS — D649 Anemia, unspecified: Secondary | ICD-10-CM

## 2015-10-23 DIAGNOSIS — D509 Iron deficiency anemia, unspecified: Secondary | ICD-10-CM

## 2015-10-23 LAB — FERRITIN: Ferritin: 5.7 ng/mL — ABNORMAL LOW (ref 22.0–322.0)

## 2015-10-23 LAB — IBC PANEL
Iron: 15 ug/dL — ABNORMAL LOW (ref 42–165)
Saturation Ratios: 3.1 % — ABNORMAL LOW (ref 20.0–50.0)
Transferrin: 350 mg/dL (ref 212.0–360.0)

## 2015-10-23 LAB — CBC WITH DIFFERENTIAL/PLATELET
Basophils Absolute: 0 10*3/uL (ref 0.0–0.1)
Basophils Relative: 1.2 % (ref 0.0–3.0)
Eosinophils Absolute: 0.3 10*3/uL (ref 0.0–0.7)
Eosinophils Relative: 8.7 % — ABNORMAL HIGH (ref 0.0–5.0)
HCT: 20.8 % — CL (ref 39.0–52.0)
Hemoglobin: 6.4 g/dL — CL (ref 13.0–17.0)
Lymphocytes Relative: 28.6 % (ref 12.0–46.0)
Lymphs Abs: 1 10*3/uL (ref 0.7–4.0)
MCHC: 31 g/dL (ref 30.0–36.0)
MCV: 78.6 fl (ref 78.0–100.0)
Monocytes Absolute: 0.5 10*3/uL (ref 0.1–1.0)
Monocytes Relative: 14.6 % — ABNORMAL HIGH (ref 3.0–12.0)
Neutro Abs: 1.7 10*3/uL (ref 1.4–7.7)
Neutrophils Relative %: 46.9 % (ref 43.0–77.0)
Platelets: 142 10*3/uL — ABNORMAL LOW (ref 150.0–400.0)
RBC: 2.65 Mil/uL — ABNORMAL LOW (ref 4.22–5.81)
RDW: 19.2 % — ABNORMAL HIGH (ref 11.5–15.5)
WBC: 3.7 10*3/uL — ABNORMAL LOW (ref 4.0–10.5)

## 2015-10-23 LAB — VITAMIN B12: Vitamin B-12: 514 pg/mL (ref 211–911)

## 2015-10-23 LAB — FOLATE: Folate: 14.7 ng/mL (ref >5.9)

## 2015-10-25 ENCOUNTER — Ambulatory Visit (HOSPITAL_COMMUNITY)
Admission: RE | Admit: 2015-10-25 | Discharge: 2015-10-25 | Disposition: A | Discharge status: Home / Self Care | Payer: BC Managed Care – PPO | Source: Ambulatory Visit | Attending: Physician Assistant | Admitting: Physician Assistant

## 2015-10-25 DIAGNOSIS — K7030 Alcoholic cirrhosis of liver without ascites: Secondary | ICD-10-CM | POA: Diagnosis not present

## 2015-10-25 DIAGNOSIS — N281 Cyst of kidney, acquired: Secondary | ICD-10-CM | POA: Insufficient documentation

## 2015-10-25 DIAGNOSIS — R609 Edema, unspecified: Secondary | ICD-10-CM

## 2015-10-25 DIAGNOSIS — K8020 Calculus of gallbladder without cholecystitis without obstruction: Secondary | ICD-10-CM | POA: Diagnosis not present

## 2015-10-26 ENCOUNTER — Telehealth: Payer: Self-pay

## 2015-10-26 ENCOUNTER — Ambulatory Visit (HOSPITAL_COMMUNITY)
Admission: RE | Admit: 2015-10-26 | Discharge: 2015-10-26 | Disposition: A | Discharge status: Home / Self Care | Payer: BC Managed Care – PPO | Source: Ambulatory Visit | Attending: Gastroenterology | Admitting: Gastroenterology

## 2015-10-26 DIAGNOSIS — D509 Iron deficiency anemia, unspecified: Secondary | ICD-10-CM | POA: Diagnosis not present

## 2015-10-26 NOTE — Telephone Encounter (Signed)
The pt arrived for instructions and will go to the lab now for the type and screen.  Amy notified

## 2015-10-26 NOTE — Telephone Encounter (Signed)
The pt was notified to go to [facility] short stay  for a type and screen today and stop by the office for procedure instructions with myself.  He has not shown up at either location at this time (3:00 pm).  I have placed a call to all available numbers voice mail not available on cell, message left on work number and emergency contact.  I spoke with Karena Addison at [facility] short stay and she will wait for a few more minutes to see if the pt shows up.  I have notified Amy Esterwood of the situation.

## 2015-10-29 ENCOUNTER — Ambulatory Visit (HOSPITAL_COMMUNITY)
Admission: RE | Admit: 2015-10-29 | Discharge: 2015-10-29 | Disposition: A | Discharge status: Home / Self Care | Payer: BC Managed Care – PPO | Source: Ambulatory Visit | Attending: Gastroenterology | Admitting: Gastroenterology

## 2015-10-29 ENCOUNTER — Encounter (HOSPITAL_COMMUNITY): Payer: Self-pay

## 2015-10-29 VITALS — BP 150/68 | HR 67 | Temp 97.8°F | Resp 16 | Ht 66.0 in | Wt 172.8 lb

## 2015-10-29 DIAGNOSIS — D509 Iron deficiency anemia, unspecified: Secondary | ICD-10-CM | POA: Diagnosis not present

## 2015-10-29 LAB — PREPARE RBC (CROSSMATCH)

## 2015-10-29 MED ORDER — SODIUM CHLORIDE 0.9 % IV SOLN
Freq: Once | INTRAVENOUS | Status: AC
  Administered 2015-10-29: 09:00:00 via INTRAVENOUS

## 2015-10-29 NOTE — Progress Notes (Signed)
Patient and wife arrived at short stay at 0800 for patient's blood transfusion. First unit # [id number] B+. Infused [id number]. Tolerated well. Up to restroom with RN assist in between units. Currently sleeping and second unit beginning to infuse. Second unit # [id number]. B+. Continue to monitor.

## 2015-10-29 NOTE — Discharge Instructions (Signed)
Blood Transfusion, Care After °Refer to this sheet in the next few weeks. These instructions provide you with information about caring for yourself after your procedure. Your health care provider may also give you more specific instructions. Your treatment has been planned according to current medical practices, but problems sometimes occur. Call your health care provider if you have any problems or questions after your procedure. °WHAT TO EXPECT AFTER THE PROCEDURE °After your procedure, it is common to have: °· Bruising and soreness at the IV site. °· Chills or fever. °· Headache. °HOME CARE INSTRUCTIONS °· Take medicines only as directed by your health care provider. Ask your health care provider if you can take an over-the-counter pain reliever in case you have a fever or headache a day or two after your transfusion. °· Return to your normal activities as directed by your health care provider. °SEEK MEDICAL CARE IF:  °· You develop redness or irritation at your IV site. °· You have persistent fever, chills, or headache. °· Your urine is darker than normal. °· Your urine turns pink, red, or brown.   °· The white part of your eye turns yellow (jaundice).   °· You feel weak after doing your normal activities.   °SEEK IMMEDIATE MEDICAL CARE IF:  °· You have trouble breathing. °· You have fever and chills along with: °¨ Anxiety. °¨ Chest or back pain. °¨ Flushed skin. °¨ Clammy skin. °¨ A rapid heartbeat. °¨ Nausea. °  °This information is not intended to replace advice given to you by your health care provider. Make sure you discuss any questions you have with your health care provider. °  °Document Released: 05/05/2014 Document Reviewed: 05/05/2014 °Elsevier Interactive Patient Education ©2016 Elsevier Inc. ° °

## 2015-10-29 NOTE — Progress Notes (Signed)
Patient completed second unit of PRBC. Tolerated well. Waiting for wife to pick him up for discharge home.

## 2015-10-30 LAB — TYPE AND SCREEN
ABO/RH(D): B POS
Antibody Screen: NEGATIVE
Unit division: 0
Unit division: 0

## 2015-10-31 ENCOUNTER — Ambulatory Visit: Payer: BC Managed Care – PPO | Admitting: Gastroenterology

## 2015-10-31 ENCOUNTER — Telehealth: Payer: Self-pay | Admitting: Gastroenterology

## 2015-10-31 ENCOUNTER — Encounter: Payer: Self-pay | Admitting: Gastroenterology

## 2015-10-31 VITALS — BP 119/64 | HR 77 | Temp 99.6°F | Ht 66.0 in | Wt 172.0 lb

## 2015-10-31 MED ORDER — SODIUM CHLORIDE 0.9 % IV SOLN: 500.0000 mL | INTRAVENOUS | Status: DC

## 2015-10-31 NOTE — Progress Notes (Signed)
Pt in for procedures.  Wife states that he has "sniffed" heroin today and had "some alcohol".  Pt agrees that he has done both today before Coming in.  Dr. Ardis Hughs notified.  Will reschedule per Dr. Ardis Hughs.

## 2015-10-31 NOTE — Telephone Encounter (Signed)
Patty, Can you please get in touch with him.  He needs bmet and CBC later this week.  Also need to reschedule his EGD and colonoscopy to next available [facility] day.  He sniffed heroine and drank several beers earlier today and so the cases have been cancelled.  Amy, Juluis Rainier

## 2015-10-31 NOTE — Progress Notes (Signed)
He presented for his colonoscopy/EGD. Admits to doing heroine 2 hours ago and drinking several beers as well.    I've explained to him and his caregiver that we will not proceed with the cases today and my office will contact him about trying again with very clear instructions that he should not be doing drugs or drinking alcohol at least the same day as his procedures.

## 2015-11-01 NOTE — Telephone Encounter (Signed)
Left message on machine to call back  

## 2015-11-02 NOTE — Telephone Encounter (Signed)
Thanks for follow up.

## 2015-11-08 NOTE — Telephone Encounter (Signed)
Left message on machine to call back letter mailed. 

## 2015-12-25 ENCOUNTER — Encounter: Payer: Self-pay | Admitting: Gastroenterology

## 2015-12-25 ENCOUNTER — Ambulatory Visit (INDEPENDENT_AMBULATORY_CARE_PROVIDER_SITE_OTHER): Payer: BC Managed Care – PPO | Admitting: Gastroenterology

## 2015-12-25 ENCOUNTER — Encounter (INDEPENDENT_AMBULATORY_CARE_PROVIDER_SITE_OTHER): Payer: Self-pay

## 2015-12-25 VITALS — BP 96/54 | HR 68 | Ht 66.0 in | Wt 156.4 lb

## 2015-12-25 DIAGNOSIS — D509 Iron deficiency anemia, unspecified: Secondary | ICD-10-CM | POA: Diagnosis not present

## 2015-12-25 DIAGNOSIS — K7460 Unspecified cirrhosis of liver: Secondary | ICD-10-CM

## 2015-12-25 NOTE — Patient Instructions (Addendum)
You will have labs checked today in the basement lab.  Please head down after you check out with the front desk  (cbc, cmet, inr).   After review of those we'll schedule you for EGD and colonoscopy (at [facility]).

## 2015-12-25 NOTE — Progress Notes (Signed)
Review of pertinent gastrointestinal problems:  1. Cirrhosis (likely related to alcohol; blood work May 2012 show hepatitis C negative, hepatitis B surface antigen negative, hepatitis B surface antibody negative, iron levels low, ANA negative, AMA negative, anti-smooth muscle antibody weakly positive)   Labs 05/2011: Tbili 2.1, platelets 120s, INR around 1.6. Albumin 1.9, AST 80s, ALT normal   EGD, September 2012 found small to medium esophageal varices, portal gastropathy, no gastric varices. Nadolol was started.  Scheduled for   Most recent imaging: 09/2015 gallstones+, cirrhosis without masses in liver   Most recent alpha-fetoprotein: 09/2015 normal  Duplex US 3/12 showed portal vein thrombus with NO flow in main portal vein   Hepatitis A/B immunization series started 05/2011    HPI: This is a  pleasant 60 year old man whom I last saw the day of some scheduled procedures about 2 months ago.    Chief complaint is cirrhosis, anemia  His weight is down 16 pounds since last visit.  He tells me he has been taking his diuretics appropriately as described below.  At his last visit here in our office with Amy Ester would, he was advised to have a low-salt diet, he was advised to increase his Lasix to 40 mg a day. Lab tests were ordered, ultrasound was ordered and he was scheduled for an EGD for varices screening.  His hemoglobin was found to be quite low, 6.7, platelets 137, ferritin 5.7, iron 15.  He was transfused 2 units as an outpatient and Given the severe IDA a colonoscopy was added to the day of his EGD however unfortunately he "drank beer and snorted heroine" on the day of the procedures and so they were cancelled .  Tells me he quit all the drugs after that visit.  I'm not sure that that is true since he did not want to have blood drawn today and he acted his usual sedated self in the office  No overt gi bleeding.  Feels overall alright, a bit tired occasionally. ROS: complete GI  ROS as described in HPI.  Constitutional:  No unintentional weight loss   Past Medical History:  Diagnosis Date  . Alcohol abuse   . Anemia   . Arthritis   . Ascites   . Cirrhosis ([place])   . Heart murmur   . Hyperlipidemia   . Hypertension   . Leg swelling   . Myocardial infarction ([place])   . Stroke [facility])    no deficits  . Thrombocytopenia ([place])     Past Surgical History:  Procedure Laterality Date  . HERNIA REPAIR Right    inguinal  . KNEE ARTHROSCOPY     bilateral/  12/14  . TOTAL KNEE ARTHROPLASTY Right 05/02/2013   Procedure: RIGHT TOTAL KNEE ARTHROPLASTY;  Surgeon: Mauri Pole, MD;  Location: [facility];  Service: Orthopedics;  Laterality: Right;    Current Outpatient Prescriptions  Medication Sig Dispense Refill  . furosemide (LASIX) 20 MG tablet Take 1 tablet (20 mg total) by mouth 2 (two) times daily. 60 tablet 6  . nadolol (CORGARD) 20 MG tablet Take 1 tablet (20 mg total) by mouth daily. 30 tablet 6  . spironolactone (ALDACTONE) 50 MG tablet Take 2 tablets (100 mg total) by mouth 2 (two) times daily. 120 tablet 6   No current facility-administered medications for this visit.     Allergies as of 12/25/2015  . (No Known Allergies)    Family History  Problem Relation Age of Onset  . Hypertension Father   .  Diabetes Father   . Dementia Mother   . Lupus Sister     Social History   Social History  . Marital status: Married    Spouse name: N/A  . Number of children: 4  . 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 Smoking status: Former Smoker    Packs/day: 0.50    Years: 10.00    Types: Cigarettes  . Smokeless tobacco: Never Used     Comment: 4 cigarettes a day  . Alcohol use Yes     Comment:  beer  80 oz  week  . Drug use:     Types: Marijuana, Heroin     Comment: cocaine 6 months ago  . Sexual activity: Not on file     Comment: now and then smokies marijuana   Other  Topics Concern  . Not on file   Social History Narrative  . No narrative on file     Physical Exam: BP (!) 96/54 (BP Location: Left Arm, Patient Position: Sitting, Cuff Size: Normal)   Pulse 68   Ht 5\' 6"  (1.676 m)   Wt 156 lb 6.4 oz (70.9 kg)   BMI 25.24 kg/m  Constitutional: Somewhat sedated appearing Psychiatric: alert and oriented x3 Abdomen: soft, nontender, nondistended, no obvious ascites, no peritoneal signs, normal bowel sounds No peripheral edema noted in lower extremities  Assessment and plan: 60 y.o. male with cirrhosis, significant iron deficiency anemia recently  I explained to him that he needs blood drawn today to recheck his significant anemia, also kidney function and electrolytes since he has been taking his diuretics were appropriately per his history. He did not want to have those labs drawn today and said he will sometime next week. He tells me there is a friend waiting for them he can't keep them waiting any longer. I find a bit suspicious. Hopefully he will indeed follow-up. Pending review of those labs may need to adjust his diuretics and at that point we will also reschedule him for colonoscopy and upper endoscopy and we will make very clear that he is due no illicit street drugs at least for 24 hours prior to those procedures.   Mark Loffler, MD [facility] 12/25/2015, 9:42 AM

## 2016-01-08 ENCOUNTER — Other Ambulatory Visit (INDEPENDENT_AMBULATORY_CARE_PROVIDER_SITE_OTHER): Payer: BC Managed Care – PPO

## 2016-01-08 DIAGNOSIS — K7460 Unspecified cirrhosis of liver: Secondary | ICD-10-CM | POA: Diagnosis not present

## 2016-01-08 LAB — CBC WITH DIFFERENTIAL/PLATELET
Basophils Absolute: 0 10*3/uL (ref 0.0–0.1)
Basophils Relative: 1.2 % (ref 0.0–3.0)
Eosinophils Absolute: 0.3 10*3/uL (ref 0.0–0.7)
Eosinophils Relative: 6.8 % — ABNORMAL HIGH (ref 0.0–5.0)
HCT: 26.7 % — ABNORMAL LOW (ref 39.0–52.0)
Hemoglobin: 9 g/dL — ABNORMAL LOW (ref 13.0–17.0)
Lymphocytes Relative: 25.5 % (ref 12.0–46.0)
Lymphs Abs: 1.1 10*3/uL (ref 0.7–4.0)
MCHC: 33.8 g/dL (ref 30.0–36.0)
MCV: 88.5 fl (ref 78.0–100.0)
Monocytes Absolute: 0.6 10*3/uL (ref 0.1–1.0)
Monocytes Relative: 14.9 % — ABNORMAL HIGH (ref 3.0–12.0)
Neutro Abs: 2.2 10*3/uL (ref 1.4–7.7)
Neutrophils Relative %: 51.6 % (ref 43.0–77.0)
Platelets: 136 10*3/uL — ABNORMAL LOW (ref 150.0–400.0)
RBC: 3.02 Mil/uL — ABNORMAL LOW (ref 4.22–5.81)
RDW: 20.3 % — ABNORMAL HIGH (ref 11.5–15.5)
WBC: 4.2 10*3/uL (ref 4.0–10.5)

## 2016-01-08 LAB — COMPREHENSIVE METABOLIC PANEL
ALT: 10 U/L (ref 0–53)
AST: 26 U/L (ref 0–37)
Albumin: 2.7 g/dL — ABNORMAL LOW (ref 3.5–5.2)
Alkaline Phosphatase: 101 U/L (ref 39–117)
BUN: 11 mg/dL (ref 6–23)
CO2: 25 mEq/L (ref 19–32)
Calcium: 8.3 mg/dL — ABNORMAL LOW (ref 8.4–10.5)
Chloride: 107 mEq/L (ref 96–112)
Creatinine, Ser: 1.13 mg/dL (ref 0.40–1.50)
GFR: 84.46 mL/min (ref >60.00)
Glucose, Bld: 99 mg/dL (ref 70–99)
Potassium: 4.7 mEq/L (ref 3.5–5.1)
Sodium: 135 mEq/L (ref 135–145)
Total Bilirubin: 0.7 mg/dL (ref 0.2–1.2)
Total Protein: 6.4 g/dL (ref 6.0–8.3)

## 2016-01-08 LAB — PROTIME-INR
INR: 1.3 ratio — ABNORMAL HIGH (ref 0.8–1.0)
Prothrombin Time: 13.8 s — ABNORMAL HIGH (ref 9.6–13.1)

## 2016-01-21 ENCOUNTER — Telehealth: Payer: Self-pay | Admitting: Gastroenterology

## 2016-01-21 ENCOUNTER — Other Ambulatory Visit: Payer: Self-pay

## 2016-01-21 DIAGNOSIS — K7469 Other cirrhosis of liver: Secondary | ICD-10-CM

## 2016-01-21 DIAGNOSIS — D509 Iron deficiency anemia, unspecified: Secondary | ICD-10-CM

## 2016-01-21 NOTE — Telephone Encounter (Signed)
Patient notified of results and recommendations He is scheduled for endo/colon with MAC at [facility] for 02/07/16 8:30 He will come for pre-visit on 01/29/16 1:00

## 2016-01-29 ENCOUNTER — Encounter (INDEPENDENT_AMBULATORY_CARE_PROVIDER_SITE_OTHER): Payer: Self-pay

## 2016-01-29 ENCOUNTER — Ambulatory Visit (AMBULATORY_SURGERY_CENTER): Payer: Self-pay

## 2016-01-29 VITALS — Ht 67.5 in | Wt 168.6 lb

## 2016-01-29 DIAGNOSIS — D508 Other iron deficiency anemias: Secondary | ICD-10-CM

## 2016-01-29 DIAGNOSIS — Z1211 Encounter for screening for malignant neoplasm of colon: Secondary | ICD-10-CM

## 2016-01-29 MED ORDER — NA SULFATE-K SULFATE-MG SULF 17.5-3.13-1.6 GM/177ML PO SOLN: ORAL | 0 refills | Status: DC

## 2016-01-29 NOTE — Progress Notes (Signed)
Per pt, no allergies to soy or egg products.Pt not taking any weight loss meds or using  O2 at home. 

## 2016-01-30 ENCOUNTER — Encounter: Payer: Self-pay | Admitting: *Deleted

## 2016-01-31 ENCOUNTER — Telehealth: Payer: Self-pay | Admitting: *Deleted

## 2016-01-31 NOTE — Telephone Encounter (Signed)
Advised patient we got a few samples in and I have a Suprep sample at our front desk.  Told him to come pick it up before 5 PM tomorrow 10-6.

## 2016-02-04 ENCOUNTER — Encounter (HOSPITAL_COMMUNITY): Payer: Self-pay | Admitting: *Deleted

## 2016-02-07 ENCOUNTER — Ambulatory Visit (HOSPITAL_COMMUNITY)
Admission: RE | Admit: 2016-02-07 | Discharge: 2016-02-07 | Disposition: A | Discharge status: Home / Self Care | Payer: BC Managed Care – PPO | Source: Ambulatory Visit | Attending: Gastroenterology | Admitting: Gastroenterology

## 2016-02-07 ENCOUNTER — Encounter (HOSPITAL_COMMUNITY)
Admission: RE | Disposition: A | Discharge status: Home / Self Care | Payer: Self-pay | Source: Ambulatory Visit | Attending: Gastroenterology

## 2016-02-07 ENCOUNTER — Ambulatory Visit (HOSPITAL_COMMUNITY): Payer: BC Managed Care – PPO | Admitting: Certified Registered Nurse Anesthetist

## 2016-02-07 ENCOUNTER — Encounter (HOSPITAL_COMMUNITY): Payer: Self-pay

## 2016-02-07 DIAGNOSIS — D122 Benign neoplasm of ascending colon: Secondary | ICD-10-CM

## 2016-02-07 DIAGNOSIS — K7469 Other cirrhosis of liver: Secondary | ICD-10-CM

## 2016-02-07 DIAGNOSIS — Z87891 Personal history of nicotine dependence: Secondary | ICD-10-CM | POA: Diagnosis not present

## 2016-02-07 DIAGNOSIS — Z8673 Personal history of transient ischemic attack (TIA), and cerebral infarction without residual deficits: Secondary | ICD-10-CM | POA: Diagnosis not present

## 2016-02-07 DIAGNOSIS — K649 Unspecified hemorrhoids: Secondary | ICD-10-CM | POA: Diagnosis not present

## 2016-02-07 DIAGNOSIS — M1990 Unspecified osteoarthritis, unspecified site: Secondary | ICD-10-CM | POA: Diagnosis not present

## 2016-02-07 DIAGNOSIS — Z96651 Presence of right artificial knee joint: Secondary | ICD-10-CM | POA: Insufficient documentation

## 2016-02-07 DIAGNOSIS — D509 Iron deficiency anemia, unspecified: Secondary | ICD-10-CM

## 2016-02-07 DIAGNOSIS — I10 Essential (primary) hypertension: Secondary | ICD-10-CM | POA: Diagnosis not present

## 2016-02-07 DIAGNOSIS — I8500 Esophageal varices without bleeding: Secondary | ICD-10-CM

## 2016-02-07 DIAGNOSIS — I252 Old myocardial infarction: Secondary | ICD-10-CM | POA: Diagnosis not present

## 2016-02-07 DIAGNOSIS — K3189 Other diseases of stomach and duodenum: Secondary | ICD-10-CM | POA: Diagnosis not present

## 2016-02-07 DIAGNOSIS — B9681 Helicobacter pylori [H. pylori] as the cause of diseases classified elsewhere: Secondary | ICD-10-CM | POA: Diagnosis not present

## 2016-02-07 DIAGNOSIS — K644 Residual hemorrhoidal skin tags: Secondary | ICD-10-CM | POA: Insufficient documentation

## 2016-02-07 DIAGNOSIS — Z79899 Other long term (current) drug therapy: Secondary | ICD-10-CM | POA: Insufficient documentation

## 2016-02-07 DIAGNOSIS — K2970 Gastritis, unspecified, without bleeding: Secondary | ICD-10-CM

## 2016-02-07 DIAGNOSIS — K7460 Unspecified cirrhosis of liver: Secondary | ICD-10-CM | POA: Insufficient documentation

## 2016-02-07 DIAGNOSIS — K648 Other hemorrhoids: Secondary | ICD-10-CM | POA: Insufficient documentation

## 2016-02-07 DIAGNOSIS — K635 Polyp of colon: Secondary | ICD-10-CM | POA: Insufficient documentation

## 2016-02-07 DIAGNOSIS — K766 Portal hypertension: Secondary | ICD-10-CM

## 2016-02-07 DIAGNOSIS — K2990 Gastroduodenitis, unspecified, without bleeding: Secondary | ICD-10-CM

## 2016-02-07 HISTORY — PX: COLONOSCOPY WITH PROPOFOL: SHX5780

## 2016-02-07 HISTORY — PX: ESOPHAGOGASTRODUODENOSCOPY (EGD) WITH PROPOFOL: SHX5813

## 2016-02-07 SURGERY — COLONOSCOPY WITH PROPOFOL
Anesthesia: Monitor Anesthesia Care

## 2016-02-07 MED ORDER — SODIUM CHLORIDE 0.9 % IV SOLN: INTRAVENOUS | Status: DC

## 2016-02-07 MED ORDER — PROPOFOL 10 MG/ML IV BOLUS
INTRAVENOUS | Status: DC | PRN
  Administered 2016-02-07: 10 mg via INTRAVENOUS
  Administered 2016-02-07 (×6): 20 mg via INTRAVENOUS

## 2016-02-07 MED ORDER — LACTATED RINGERS IV SOLN
INTRAVENOUS | Status: DC
  Administered 2016-02-07: 1000 mL via INTRAVENOUS

## 2016-02-07 MED ORDER — ONDANSETRON HCL 4 MG/2ML IJ SOLN
INTRAMUSCULAR | Status: AC
  Filled 2016-02-07: qty 2

## 2016-02-07 MED ORDER — ONDANSETRON HCL 4 MG/2ML IJ SOLN
INTRAMUSCULAR | Status: DC | PRN
  Administered 2016-02-07: 4 mg via INTRAVENOUS

## 2016-02-07 MED ORDER — LIDOCAINE 2% (20 MG/ML) 5 ML SYRINGE
INTRAMUSCULAR | Status: DC | PRN
  Administered 2016-02-07: 100 mg via INTRAVENOUS

## 2016-02-07 MED ORDER — LIDOCAINE 2% (20 MG/ML) 5 ML SYRINGE
INTRAMUSCULAR | Status: AC
  Filled 2016-02-07: qty 5

## 2016-02-07 MED ORDER — PROPOFOL 10 MG/ML IV BOLUS
INTRAVENOUS | Status: AC
  Filled 2016-02-07: qty 60

## 2016-02-07 MED ORDER — PROPOFOL 500 MG/50ML IV EMUL
INTRAVENOUS | Status: DC | PRN
  Administered 2016-02-07: 75 ug/kg/min via INTRAVENOUS

## 2016-02-07 SURGICAL SUPPLY — 24 items

## 2016-02-07 NOTE — Anesthesia Postprocedure Evaluation (Signed)
Anesthesia Post Note  Patient: MARCUZ WARRINER  Procedure(s) Performed: Procedure(s) (LRB): COLONOSCOPY WITH PROPOFOL (N/A) ESOPHAGOGASTRODUODENOSCOPY (EGD) WITH PROPOFOL (N/A)  Patient location during evaluation: PACU Anesthesia Type: MAC Level of consciousness: awake and alert Pain management: pain level controlled Vital Signs Assessment: post-procedure vital signs reviewed and stable Respiratory status: spontaneous breathing, nonlabored ventilation, respiratory function stable and patient connected to nasal cannula oxygen Cardiovascular status: stable and blood pressure returned to baseline Anesthetic complications: no    Last Vitals:  Vitals:   02/07/16 0747 02/07/16 0911  BP: (!) 151/81 (!) 164/84  Pulse: 78 82  Resp: 16 (!) 8  Temp: 36.6 C 36.4 C    Last Pain:  Vitals:   02/07/16 0911  TempSrc: Oral                 Anijah Spohr S

## 2016-02-07 NOTE — Op Note (Signed)
[facility] Patient Name: Mark [name] Procedure Date: 02/07/2016 MRN: [id number] Attending MD: Milus Banister , MD Date of Birth: [birth year] CSN: [id number] Age: 60 Admit Type: Outpatient Procedure:                Upper GI endoscopy Indications:              Iron deficiency anemia Providers:                Milus Banister, MD, Cleda Daub, RN, Elspeth Cho Tech., Technician, Adair Laundry, CRNA Referring MD:              Medicines:                Monitored Anesthesia Care Complications:            No immediate complications. Estimated blood loss:                            None. Estimated Blood Loss:     Estimated blood loss: none. Procedure:                Pre-Anesthesia Assessment:                           - Prior to the procedure, a History and Physical                            was performed, and patient medications and                            allergies were reviewed. The patient's tolerance of                            previous anesthesia was also reviewed. The risks                            and benefits of the procedure and the sedation                            options and risks were discussed with the patient.                            All questions were answered, and informed consent                            was obtained. Prior Anticoagulants: The patient has                            taken no previous anticoagulant or antiplatelet                            agents. ASA Grade Assessment: III - A patient with  severe systemic disease. After reviewing the risks                            and benefits, the patient was deemed in                            satisfactory condition to undergo the procedure.                           After obtaining informed consent, the endoscope was                            passed under direct vision. Throughout the                            procedure, the  patient's blood pressure, pulse, and                            oxygen saturations were monitored continuously. The                            Endoscope was introduced through the mouth, and                            advanced to the second part of duodenum. The upper                            GI endoscopy was accomplished without difficulty.                            The patient tolerated the procedure well. Scope In: Scope Out: Findings:      Grade I varices were found in the lower third of the esophagus. There       were no signs of recent blooding.      There was moderate portal gastropathy throughout the stomach, most       pronounced in the proximal 2/3 of the stomach. There was mild,       non-specific distal gastritis. Biopsies were taken with a cold forceps       for histology. Once again there was more than typical oozing from biopsy       sites, these stopped with observation, water flushes.      The examined duodenum was normal. Impression:               - Grade I esophageal varices.                           - Portal gastropathy, moderated.                           - Mild distal gastritis. Biopsied.                           - Normal examined duodenum. Moderate Sedation:      N/A- Per Anesthesia Care Recommendation:           -  Patient has a contact number available for                            emergencies. The signs and symptoms of potential                            delayed complications were discussed with the                            patient. Return to normal activities tomorrow.                            Written discharge instructions were provided to the                            patient.                           - Resume previous diet.                           - Continue present medications.                           - Await pathology results.                           - He should be taking iron daily but it is not on                            his med  list, I will remind him of this.                           - My office will call to schedule return OV in 2                            months and to help set him up with new PCP (per his                            request). Procedure Code(s):        --- Professional ---                           [phone], Esophagogastroduodenoscopy, flexible,                            transoral; with biopsy, single or multiple Diagnosis Code(s):        --- Professional ---                           I85.00, Esophageal varices without bleeding                           K29.70, Gastritis, unspecified, without bleeding  D50.9, Iron deficiency anemia, unspecified CPT copyright 2016 American [employer] Association. All rights reserved. The codes documented in this report are preliminary and upon coder review may  be revised to meet current compliance requirements. Milus Banister, MD 02/07/2016 9:17:40 AM This report has been signed electronically. Number of Addenda: 0

## 2016-02-07 NOTE — Op Note (Signed)
[facility] Patient Name: Mark [name] Procedure Date: 02/07/2016 MRN: [id number] Attending MD: Milus Banister , MD Date of Birth: August 18, 1955 CSN: [id number] Age: 60 Admit Type: Outpatient Procedure:                Colonoscopy Indications:              Iron deficiency anemia; cirrhosis, continued                            polypsubstance abuse Providers:                Milus Banister, MD, Cleda Daub, RN, Elspeth Cho Tech., Technician, Adair Laundry, CRNA Referring MD:              Medicines:                Monitored Anesthesia Care Complications:            No immediate complications. Estimated blood loss:                            None. Estimated Blood Loss:     Estimated blood loss: none. Procedure:                Pre-Anesthesia Assessment:                           - Prior to the procedure, a History and Physical                            was performed, and patient medications and                            allergies were reviewed. The patient's tolerance of                            previous anesthesia was also reviewed. The risks                            and benefits of the procedure and the sedation                            options and risks were discussed with the patient.                            All questions were answered, and informed consent                            was obtained. Prior Anticoagulants: The patient has                            taken no previous anticoagulant or antiplatelet                            agents. ASA Grade Assessment:  III - A patient with                            severe systemic disease. After reviewing the risks                            and benefits, the patient was deemed in                            satisfactory condition to undergo the procedure.                           After obtaining informed consent, the colonoscope                            was passed under direct  vision. Throughout the                            procedure, the patient's blood pressure, pulse, and                            oxygen saturations were monitored continuously. The                            [id number]) scope was introduced through                            the anus and advanced to the the cecum, identified                            by appendiceal orifice and ileocecal valve. The                            colonoscopy was performed without difficulty. The                            patient tolerated the procedure well. The quality                            of the bowel preparation was excellent. The                            ileocecal valve, appendiceal orifice, and rectum                            were photographed. Scope In: 8:36:13 AM Scope Out: 8:49:36 AM Scope Withdrawal Time: 0 hours 10 minutes 3 seconds  Total Procedure Duration: 0 hours 13 minutes 23 seconds  Findings:      A 6 mm polyp was found in the cecum. The polyp was sessile. The polyp       was removed with a cold snare. There was more than usual post       polypectomy oozing of blood, but this stopped with observation and water       flushes. The polyp was  not retrieved..      External and internal hemorrhoids were found. The hemorrhoids were small.      The exam was otherwise without abnormality on direct and retroflexion       views. Impression:               - One 7 mm polyp in the cecum, removed with a cold                            snare. Resected and retrieved.                           - External and internal hemorrhoids.                           - The examination was otherwise normal on direct                            and retroflexion views. Moderate Sedation:      N/A- Per Anesthesia Care Recommendation:           - Patient has a contact number available for                            emergencies. The signs and symptoms of potential                            delayed  complications were discussed with the                            patient. Return to normal activities tomorrow.                            Written discharge instructions were provided to the                            patient.                           - Resume previous diet.                           - Continue present medications.                           - Repeat colonoscopy in 10 years for screening                            purposes.                           - EGD now. Procedure Code(s):        --- Professional ---                           [phone], Colonoscopy, flexible; with removal of  tumor(s), polyp(s), or other lesion(s) by snare                            technique Diagnosis Code(s):        --- Professional ---                           D12.0, Benign neoplasm of cecum                           K64.8, Other hemorrhoids                           D50.9, Iron deficiency anemia, unspecified CPT copyright 2016 American Medical Association. All rights reserved. The codes documented in this report are preliminary and upon coder review may  be revised to meet current compliance requirements. Milus Banister, MD 02/07/2016 9:10:58 AM This report has been signed electronically. Number of Addenda: 0

## 2016-02-07 NOTE — H&P (Signed)
Review of pertinent gastrointestinal problems:  1. Cirrhosis (likely related to alcohol; blood work May 2012 show hepatitis C negative, hepatitis B surface antigen negative, hepatitis B surface antibody negative, iron levels low, ANA negative, AMA negative, anti-smooth muscle antibody weakly positive)   Labs 05/2011: Tbili 2.1, platelets 120s, INR around 1.6. Albumin 1.9, AST 80s, ALT normal   EGD, September 2012 found small to medium esophageal varices, portal gastropathy, no gastric varices. Nadolol was started.  Scheduled for   Most recent imaging: 09/2015 gallstones+, cirrhosis without masses in liver   Most recent alpha-fetoprotein: 09/2015 normal  Duplex US 3/12 showed portal vein thrombus with NO flow in main portal vein   Hepatitis A/B immunization series started 05/2011    HPI: This is a  pleasant 60 year old man whom I last saw the day of some scheduled procedures about 2 months ago.    Chief complaint is cirrhosis, anemia  His weight is down 16 pounds since last visit.  He tells me he has been taking his diuretics appropriately as described below.  At his last visit here in our office with Amy Ester would, he was advised to have a low-salt diet, he was advised to increase his Lasix to 40 mg a day. Lab tests were ordered, ultrasound was ordered and he was scheduled for an EGD for varices screening.  His hemoglobin was found to be quite low, 6.7, platelets 137, ferritin 5.7, iron 15.  He was transfused 2 units as an outpatient and Given the severe IDA a colonoscopy was added to the day of his EGD however unfortunately he "drank beer and snorted heroine" on the day of the procedures and so they were cancelled .  Tells me he quit all the drugs after that visit.  I'm not sure that that is true since he did not want to have blood drawn today and he acted his usual sedated self in the office  No overt gi bleeding.  Feels overall alright, a bit tired occasionally. ROS:  complete GI ROS as described in HPI.  Constitutional:  No unintentional weight loss       Past Medical History:  Diagnosis Date  . Alcohol abuse   . Anemia   . Arthritis   . Ascites   . Cirrhosis ([place])   . Heart murmur   . Hyperlipidemia   . Hypertension   . Leg swelling   . Myocardial infarction ([place])   . Stroke [facility])    no deficits  . Thrombocytopenia ([place])          Past Surgical History:  Procedure Laterality Date  . HERNIA REPAIR Right    inguinal  . KNEE ARTHROSCOPY     bilateral/  12/14  . TOTAL KNEE ARTHROPLASTY Right 05/02/2013   Procedure: RIGHT TOTAL KNEE ARTHROPLASTY;  Surgeon: Mauri Pole, MD;  Location: [facility];  Service: Orthopedics;  Laterality: Right;          Current Outpatient Prescriptions  Medication Sig Dispense Refill  . furosemide (LASIX) 20 MG tablet Take 1 tablet (20 mg total) by mouth 2 (two) times daily. 60 tablet 6  . nadolol (CORGARD) 20 MG tablet Take 1 tablet (20 mg total) by mouth daily. 30 tablet 6  . spironolactone (ALDACTONE) 50 MG tablet Take 2 tablets (100 mg total) by mouth 2 (two) times daily. 120 tablet 6   No current facility-administered medications for this visit.        Allergies as of 12/25/2015  . (No Known Allergies)  Family History  Problem Relation Age of Onset  . Hypertension Father   . Diabetes Father   . Dementia Mother   . Lupus Sister     Social History        Social History  . Marital status: Married    Spouse name: N/A  . Number of children: 4  . 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 Smoking status: Former Smoker    Packs/day: 0.50    Years: 10.00    Types: Cigarettes  . Smokeless tobacco: Never Used     Comment: 4 cigarettes a day  . Alcohol use Yes     Comment:  beer  80 oz  week  . Drug use:     Types: Marijuana, Heroin      Comment: cocaine 6 months ago  . Sexual activity: Not on file     Comment: now and then smokies marijuana       Other Topics Concern  . Not on file      Social History Narrative  . No narrative on file     Physical Exam: Constitutional: Somewhat sedated appearing Psychiatric: alert and oriented x3 Abdomen: soft, nontender, nondistended, no obvious ascites, no peritoneal signs, normal bowel sounds No peripheral edema noted in lower extremities  Assessment and plan: 60 y.o. male with cirrhosis, significant iron deficiency anemia recently  I explained to him that he needs blood drawn today to recheck his significant anemia, also kidney function and electrolytes since he has been taking his diuretics were appropriately per his history. He did not want to have those labs drawn today and said he will sometime next week. He tells me there is a friend waiting for them he can't keep them waiting any longer. I find a bit suspicious. Hopefully he will indeed follow-up. Pending review of those labs may need to adjust his diuretics and at that point we will also reschedule him for colonoscopy and upper endoscopy and we will make very clear that he is due no illicit street drugs at least for 24 hours prior to those procedures.

## 2016-02-07 NOTE — Discharge Instructions (Signed)
YOU HAD AN ENDOSCOPIC PROCEDURE TODAY: Refer to the procedure report and other information in the discharge instructions given to you for any specific questions about what was found during the examination. If this information does not answer your questions, please call [facility] office at [phone] to clarify.  ° °YOU SHOULD EXPECT: Some feelings of bloating in the abdomen. Passage of more gas than usual. Walking can help get rid of the air that was put into your GI tract during the procedure and reduce the bloating. If you had a lower endoscopy (such as a colonoscopy or flexible sigmoidoscopy) you may notice spotting of blood in your stool or on the toilet paper. Some abdominal soreness may be present for a day or two, also. ° °DIET: Your first meal following the procedure should be a light meal and then it is ok to progress to your normal diet. A half-sandwich or bowl of soup is an example of a good first meal. Heavy or fried foods are harder to digest and may make you feel nauseous or bloated. Drink plenty of fluids but you should avoid alcoholic beverages for 24 hours. If you had a esophageal dilation, please see attached instructions for diet.   ° °ACTIVITY: Your care partner should take you home directly after the procedure. You should plan to take it easy, moving slowly for the rest of the day. You can resume normal activity the day after the procedure however YOU SHOULD NOT DRIVE, use power tools, machinery or perform tasks that involve climbing or major physical exertion for 24 hours (because of the sedation medicines used during the test).  ° °SYMPTOMS TO REPORT IMMEDIATELY: °A gastroenterologist can be reached at any hour. Please call [phone]  for any of the following symptoms:  °Following lower endoscopy (colonoscopy, flexible sigmoidoscopy) °Excessive amounts of blood in the stool  °Significant tenderness, worsening of abdominal pains  °Swelling of the abdomen that is new, acute  °Fever of 100° or  higher  °Following upper endoscopy (EGD, EUS, ERCP, esophageal dilation) °Vomiting of blood or coffee ground material  °New, significant abdominal pain  °New, significant chest pain or pain under the shoulder blades  °Painful or persistently difficult swallowing  °New shortness of breath  °Black, tarry-looking or red, bloody stools ° °FOLLOW UP:  °If any biopsies were taken you will be contacted by phone or by letter within the next 1-3 weeks. Call [phone]  if you have not heard about the biopsies in 3 weeks.  °Please also call with any specific questions about appointments or follow up tests. ° °

## 2016-02-07 NOTE — Anesthesia Preprocedure Evaluation (Signed)
Anesthesia Evaluation  Patient identified by MRN, date of birth, ID band Patient awake    Reviewed: Allergy & Precautions, NPO status , Patient's Chart, lab work & pertinent test results  Airway Mallampati: II   Neck ROM: full    Dental   Pulmonary former smoker,    breath sounds clear to auscultation       Cardiovascular hypertension, + Past MI   Rhythm:regular Rate:Normal     Neuro/Psych CVA, No Residual Symptoms    GI/Hepatic (+) Cirrhosis     substance abuse  alcohol use,   Endo/Other    Renal/GU Renal InsufficiencyRenal disease     Musculoskeletal  (+) Arthritis ,   Abdominal   Peds  Hematology  (+) anemia ,   Anesthesia Other Findings   Reproductive/Obstetrics                             Anesthesia Physical Anesthesia Plan  ASA: III  Anesthesia Plan: MAC   Post-op Pain Management:    Induction: Intravenous  Airway Management Planned: Nasal Cannula  Additional Equipment:   Intra-op Plan:   Post-operative Plan:   Informed Consent: I have reviewed the patients History and Physical, chart, labs and discussed the procedure including the risks, benefits and alternatives for the proposed anesthesia with the patient or authorized representative who has indicated his/her understanding and acceptance.     Plan Discussed with: CRNA, Anesthesiologist and Surgeon  Anesthesia Plan Comments:         Anesthesia Quick Evaluation

## 2016-02-07 NOTE — Transfer of Care (Signed)
Immediate Anesthesia Transfer of Care Note  Patient: Mark [name]  Procedure(s) Performed: Procedure(s): COLONOSCOPY WITH PROPOFOL (N/A) ESOPHAGOGASTRODUODENOSCOPY (EGD) WITH PROPOFOL (N/A)  Patient Location: ENDO  Anesthesia Type:MAC  Level of Consciousness:  sedated, patient cooperative and responds to stimulation  Airway & Oxygen Therapy:Patient Spontanous Breathing and Patient connected to face mask oxgen  Post-op Assessment:  Report given to ENDO RN and Post -op Vital signs reviewed and stable  Post vital signs:  Reviewed and stable  Last Vitals:  Vitals:   02/07/16 0747 02/07/16 0911  BP: (!) 151/81 (!) 164/84  Pulse: 78 82  Resp: 16 (!) 8  Temp: [id number] C     Complications: No apparent anesthesia complications

## 2016-02-08 ENCOUNTER — Telehealth: Payer: Self-pay

## 2016-02-08 ENCOUNTER — Encounter (HOSPITAL_COMMUNITY): Payer: Self-pay | Admitting: Gastroenterology

## 2016-02-08 NOTE — Telephone Encounter (Signed)
-----   Message from Milus Banister, MD sent at 02/07/2016  9:21 AM EDT ----- He needs rov with me in 2 months,  Thanks  Also CBC in 1 month. When you set these up with him can you please remind him that he should be taking once daily OTC iron supplement.   Also, He needs help getting new PCP.  I think [facility].  Thanks

## 2016-02-08 NOTE — Telephone Encounter (Signed)
Left message on machine to call back  

## 2016-02-12 NOTE — Telephone Encounter (Signed)
Left message on machine to call back letter mailed. 

## 2016-03-03 ENCOUNTER — Other Ambulatory Visit: Payer: Self-pay

## 2016-03-03 DIAGNOSIS — K7460 Unspecified cirrhosis of liver: Secondary | ICD-10-CM

## 2016-05-13 ENCOUNTER — Encounter: Payer: Self-pay | Admitting: Gastroenterology

## 2016-05-13 ENCOUNTER — Ambulatory Visit (INDEPENDENT_AMBULATORY_CARE_PROVIDER_SITE_OTHER): Payer: BC Managed Care – PPO | Admitting: Gastroenterology

## 2016-05-13 ENCOUNTER — Other Ambulatory Visit (INDEPENDENT_AMBULATORY_CARE_PROVIDER_SITE_OTHER): Payer: BC Managed Care – PPO

## 2016-05-13 VITALS — BP 80/52 | HR 80 | Ht 67.0 in | Wt 161.0 lb

## 2016-05-13 DIAGNOSIS — D509 Iron deficiency anemia, unspecified: Secondary | ICD-10-CM

## 2016-05-13 DIAGNOSIS — K7460 Unspecified cirrhosis of liver: Secondary | ICD-10-CM

## 2016-05-13 DIAGNOSIS — D508 Other iron deficiency anemias: Secondary | ICD-10-CM

## 2016-05-13 LAB — COMPREHENSIVE METABOLIC PANEL
ALT: 11 U/L (ref 0–53)
AST: 29 U/L (ref 0–37)
Albumin: 2.5 g/dL — ABNORMAL LOW (ref 3.5–5.2)
Alkaline Phosphatase: 125 U/L — ABNORMAL HIGH (ref 39–117)
BUN: 10 mg/dL (ref 6–23)
CO2: 25 mEq/L (ref 19–32)
Calcium: 8.2 mg/dL — ABNORMAL LOW (ref 8.4–10.5)
Chloride: 110 mEq/L (ref 96–112)
Creatinine, Ser: 1.06 mg/dL (ref 0.40–1.50)
GFR: 90.83 mL/min (ref >60.00)
Glucose, Bld: 92 mg/dL (ref 70–99)
Potassium: 4.4 mEq/L (ref 3.5–5.1)
Sodium: 138 mEq/L (ref 135–145)
Total Bilirubin: 1.6 mg/dL — ABNORMAL HIGH (ref 0.2–1.2)
Total Protein: 6.5 g/dL (ref 6.0–8.3)

## 2016-05-13 LAB — CBC WITH DIFFERENTIAL/PLATELET
Basophils Absolute: 0 10*3/uL (ref 0.0–0.1)
Basophils Relative: 0.6 % (ref 0.0–3.0)
Eosinophils Absolute: 0.5 10*3/uL (ref 0.0–0.7)
Eosinophils Relative: 12.2 % — ABNORMAL HIGH (ref 0.0–5.0)
HCT: 33 % — ABNORMAL LOW (ref 39.0–52.0)
Hemoglobin: 11.3 g/dL — ABNORMAL LOW (ref 13.0–17.0)
Lymphocytes Relative: 30.5 % (ref 12.0–46.0)
Lymphs Abs: 1.1 10*3/uL (ref 0.7–4.0)
MCHC: 34.1 g/dL (ref 30.0–36.0)
MCV: 95.3 fl (ref 78.0–100.0)
Monocytes Absolute: 0.5 10*3/uL (ref 0.1–1.0)
Monocytes Relative: 13.9 % — ABNORMAL HIGH (ref 3.0–12.0)
Neutro Abs: 1.6 10*3/uL (ref 1.4–7.7)
Neutrophils Relative %: 42.8 % — ABNORMAL LOW (ref 43.0–77.0)
Platelets: 130 10*3/uL — ABNORMAL LOW (ref 150.0–400.0)
RBC: 3.46 Mil/uL — ABNORMAL LOW (ref 4.22–5.81)
RDW: 17.2 % — ABNORMAL HIGH (ref 11.5–15.5)
WBC: 3.7 10*3/uL — ABNORMAL LOW (ref 4.0–10.5)

## 2016-05-13 LAB — PROTIME-INR
INR: 1.4 ratio — ABNORMAL HIGH (ref 0.8–1.0)
Prothrombin Time: 15.2 s — ABNORMAL HIGH (ref 9.6–13.1)

## 2016-05-13 NOTE — Patient Instructions (Addendum)
You will have labs checked today in the basement lab.  Please head down after you check out with the front desk  (cbc, cmet, inr). 14 day course of Pylera. At the same time they need to be on twice daily PPI (omeprazole 20mg  tiwce daily OTC).   Restart your iron (OTC) take one pill every night at bedtime.  If you cannot tolerate this, please call for prescription iron tablet instead. Please return to see Dr. Ardis Hughs in 3 months.

## 2016-05-13 NOTE — Progress Notes (Signed)
Review of pertinent gastrointestinal problems:  1. Cirrhosis (likely related to alcohol; blood work May 2012 show hepatitis C negative, hepatitis B surface antigen negative, hepatitis B surface antibody negative, iron levels low, ANA negative, AMA negative, anti-smooth muscle antibody weakly positive)   Labs 05/2011: Tbili 2.1, platelets 120s, INR around 1.6. Albumin 1.9, AST 80s, ALT normal   EGD, September 2012 found small to medium esophageal varices, portal gastropathy, no gastric varices. Nadolol was started.  EGD 01/2016 small esophageal varices, moderate portal gastropathy, gastritis (H. Pylori +, was started on appropriate antibiotics)  Most recent imaging: 09/2015 gallstones+, cirrhosis without masses in liver   Most recent alpha-fetoprotein: 09/2015 normal  Duplex US 3/12 showed portal vein thrombus with NO flow in main portal vein   Hepatitis A/B immunization series started 05/2011  2. Average risk for colon cancer: Colonoscopy 01/2016 Dr. Ardis Hughs; 31mm polyp removed, not retrieved.  Recall colonoscopy 10 year interval recommended.  3. Persistent noncompliance, substance abuse. Often does not follow recommend medicine doses, continues to drink, continues to do illicit substances. Showed up for outpatient colonoscopy and upper endoscopy after snorting heroin and drinking beer.   HPI: This is a pleasant 61 year old man whom I last saw about 3 months ago  Chief complaint is cirrhosis  The last time I saw him was for upper endoscopy and colonoscopy. The biopsies from stomach showed H. pylori. We try to get in touch with him by phone without success. We eventually wrote a letter for him explaining he needs to call us so that we can get him on appropriate antibiotics. I have not heard from him until this appointment now. Last time I saw him I also recommended to be on iron once daily. He stopped that months ago because it made him nauseous. He never called to let us know about that.  He  tells me today he is not drinking anymore but his wife in the room with him says he is. He denies other illicit drugs as well. Did not get a response from her about that.  He has had no abdominal pains, no swelling in his legs, no overt GI bleeding.  ROS: complete GI ROS as described in HPI.  Constitutional:  No unintentional weight loss   Past Medical History:  Diagnosis Date  . Alcohol abuse   . Anemia   . Arthritis   . Ascites   . Cirrhosis ([place])   . Heart murmur   . Hyperlipidemia   . Hypertension   . Leg swelling   . Myocardial infarction 2012  . Stroke [facility]) 2012   no deficits  . Thrombocytopenia ([place])     Past Surgical History:  Procedure Laterality Date  . COLONOSCOPY WITH PROPOFOL N/A 02/07/2016   Procedure: COLONOSCOPY WITH PROPOFOL;  Surgeon: Milus Banister, MD;  [facility];  Service: Endoscopy;  Laterality: N/A;  . ESOPHAGOGASTRODUODENOSCOPY (EGD) WITH PROPOFOL N/A 02/07/2016   Procedure: ESOPHAGOGASTRODUODENOSCOPY (EGD) WITH PROPOFOL;  Surgeon: Milus Banister, MD;  [facility];  Service: Endoscopy;  Laterality: N/A;  . HERNIA REPAIR Right    inguinal  . KNEE ARTHROSCOPY     bilateral/  12/14  . TOTAL KNEE ARTHROPLASTY Right 05/02/2013   Procedure: RIGHT TOTAL KNEE ARTHROPLASTY;  Surgeon: Mauri Pole, MD;  Location: [facility];  Service: Orthopedics;  Laterality: Right;    Current Outpatient Prescriptions  Medication Sig Dispense Refill  . furosemide (LASIX) 20 MG tablet Take 1 tablet (20 mg total) by mouth 2 (two) times  daily. 60 tablet 6  . nadolol (CORGARD) 20 MG tablet Take 1 tablet (20 mg total) by mouth daily. 30 tablet 6  . spironolactone (ALDACTONE) 50 MG tablet Take 2 tablets (100 mg total) by mouth 2 (two) times daily. 120 tablet 6   No current facility-administered medications for this visit.     Allergies as of 05/13/2016  . (No Known Allergies)    Family History  Problem Relation Age of Onset  . Hypertension Father    . Diabetes Father   . Dementia Mother   . Lupus Sister     Social History   Social History  . Marital status: Married    Spouse name: N/A  . Number of children: 4  . 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 Smoking status: Former Smoker    Packs/day: 0.50    Years: 10.00    Types: Cigarettes    Quit date: 01/05/2016  . Smokeless tobacco: Never Used     Comment: 4 cigarettes a day  . Alcohol use No     Comment: - quit- beer  80 oz  week  . Drug use:     Types: Marijuana, Heroin     Comment: took about 1 month ago  . Sexual activity: Not on file     Comment: now and then smokies marijuana   Other Topics Concern  . Not on file   Social History Narrative  . No narrative on file     Physical Exam: BP (!) 80/52   Pulse 80   Ht 5\' 7"  (1.702 m)   Wt 161 lb (73 kg)   BMI 25.22 kg/m  Constitutional: Chronically ill-appearing Psychiatric: alert and oriented x3 Abdomen: soft, nontender, nondistended, no obvious ascites, no peritoneal signs, normal bowel sounds No peripheral edema noted in lower extremities  Assessment and plan: 61 y.o. male with cirrhosis, continued alcohol drinking, possible continued substance abuse otherwise, persistent noncompliance with medical recommendations  Very difficult to take care of Mark [name]. He does not follow medication instructions very well, he continues to drink, he might be still using other illicit substances. I recommended he get back on the iron that was making him nauseous before. He is going to start taking this at night instead of in the morning. I asked him to call if he is still having nausea and vomiting from it and if so we'll give him a prescription iron pill. His H. pylori gastritis has not been treated yet and we are going to get him prescription for Pylera or samples of it for 14 days. He needs a repeat set of lab testing today including CBC,  complete about profile, coags and he will return to see me in 3 months time.   Mark Loffler, MD [facility] 05/13/2016, 11:15 AM

## 2016-05-21 ENCOUNTER — Telehealth: Payer: Self-pay | Admitting: Gastroenterology

## 2016-05-21 MED ORDER — OMEPRAZOLE 20 MG PO CPDR: 20.0000 mg | DELAYED_RELEASE_CAPSULE | Freq: Two times a day (BID) | ORAL | 3 refills | Status: DC

## 2016-05-21 NOTE — Telephone Encounter (Signed)
The patient has been notified of this information and all questions answered.

## 2016-06-05 ENCOUNTER — Telehealth: Payer: Self-pay | Admitting: Gastroenterology

## 2016-06-05 NOTE — Telephone Encounter (Signed)
Pt completed 10 days of pylera and 3 days after starting began itching all over.  He was advised to stop the Pylera and take benadryl for the itching and will forward to Dr Ardis Hughs for further recommendations.

## 2016-06-06 NOTE — Telephone Encounter (Signed)
The pt was notified and follow up was scheduled.  He will call if the itching does not continue to improve.

## 2016-06-06 NOTE — Telephone Encounter (Signed)
10 days MAY be long enough course to eradicate the H. Pylori.  He needs rov in 2 months

## 2016-08-12 ENCOUNTER — Encounter (INDEPENDENT_AMBULATORY_CARE_PROVIDER_SITE_OTHER): Payer: Self-pay

## 2016-08-12 ENCOUNTER — Other Ambulatory Visit (INDEPENDENT_AMBULATORY_CARE_PROVIDER_SITE_OTHER): Payer: BC Managed Care – PPO

## 2016-08-12 ENCOUNTER — Ambulatory Visit (INDEPENDENT_AMBULATORY_CARE_PROVIDER_SITE_OTHER): Payer: BC Managed Care – PPO | Admitting: Gastroenterology

## 2016-08-12 ENCOUNTER — Encounter: Payer: Self-pay | Admitting: Gastroenterology

## 2016-08-12 VITALS — BP 104/60 | HR 68 | Ht 67.0 in | Wt 147.8 lb

## 2016-08-12 DIAGNOSIS — K7460 Unspecified cirrhosis of liver: Secondary | ICD-10-CM

## 2016-08-12 LAB — CBC WITH DIFFERENTIAL/PLATELET
Basophils Absolute: 0 10*3/uL (ref 0.0–0.1)
Basophils Relative: 1.1 % (ref 0.0–3.0)
Eosinophils Absolute: 0.2 10*3/uL (ref 0.0–0.7)
Eosinophils Relative: 6.6 % — ABNORMAL HIGH (ref 0.0–5.0)
HCT: 30.5 % — ABNORMAL LOW (ref 39.0–52.0)
Hemoglobin: 10.6 g/dL — ABNORMAL LOW (ref 13.0–17.0)
Lymphocytes Relative: 27.2 % (ref 12.0–46.0)
Lymphs Abs: 0.8 10*3/uL (ref 0.7–4.0)
MCHC: 34.9 g/dL (ref 30.0–36.0)
MCV: 103.5 fl — ABNORMAL HIGH (ref 78.0–100.0)
Monocytes Absolute: 0.5 10*3/uL (ref 0.1–1.0)
Monocytes Relative: 17.2 % — ABNORMAL HIGH (ref 3.0–12.0)
Neutro Abs: 1.3 10*3/uL — ABNORMAL LOW (ref 1.4–7.7)
Neutrophils Relative %: 47.9 % (ref 43.0–77.0)
Platelets: 98 10*3/uL — ABNORMAL LOW (ref 150.0–400.0)
RBC: 2.94 Mil/uL — ABNORMAL LOW (ref 4.22–5.81)
RDW: 15.2 % (ref 11.5–15.5)
WBC: 2.8 10*3/uL — ABNORMAL LOW (ref 4.0–10.5)

## 2016-08-12 LAB — COMPREHENSIVE METABOLIC PANEL
ALT: 12 U/L (ref 0–53)
AST: 32 U/L (ref 0–37)
Albumin: 3.1 g/dL — ABNORMAL LOW (ref 3.5–5.2)
Alkaline Phosphatase: 102 U/L (ref 39–117)
BUN: 11 mg/dL (ref 6–23)
CO2: 25 mEq/L (ref 19–32)
Calcium: 9.2 mg/dL (ref 8.4–10.5)
Chloride: 100 mEq/L (ref 96–112)
Creatinine, Ser: 1.2 mg/dL (ref 0.40–1.50)
GFR: 78.65 mL/min (ref >60.00)
Glucose, Bld: 109 mg/dL — ABNORMAL HIGH (ref 70–99)
Potassium: 4.6 mEq/L (ref 3.5–5.1)
Sodium: 129 mEq/L — ABNORMAL LOW (ref 135–145)
Total Bilirubin: 1.9 mg/dL — ABNORMAL HIGH (ref 0.2–1.2)
Total Protein: 7.3 g/dL (ref 6.0–8.3)

## 2016-08-12 LAB — PROTIME-INR
INR: 1.4 ratio — ABNORMAL HIGH (ref 0.8–1.0)
Prothrombin Time: 15 s — ABNORMAL HIGH (ref 9.6–13.1)

## 2016-08-12 NOTE — Progress Notes (Signed)
Review of pertinent gastrointestinal problems:  1. Cirrhosis (likely related to alcohol; blood work May 2012 show hepatitis C negative, hepatitis B surface antigen negative, hepatitis B surface antibody negative, iron levels low, ANA negative, AMA negative, anti-smooth muscle antibody weakly positive)   Labs 05/2011: Tbili 2.1, platelets 120s, INR around 1.6. Albumin 1.9, AST 80s, ALT normal   EGD, September 2012 found small to medium esophageal varices, portal gastropathy, no gastric varices. Nadolol was started. EGD 01/2016 small esophageal varices, moderate portal gastropathy, gastritis (H. Pylori +, was started on appropriate antibiotics but he never took them.  Again prescribed pylera 04/2016, he completed 7 days)  Most recent imaging: 09/2015 gallstones+, cirrhosis without masses in liver  Most recent alpha-fetoprotein: 09/2015 normal  Duplex US 3/12 showed portal vein thrombus with NO flow in main portal vein   Hepatitis A/B immunization series started 05/2011  2. Average risk for colon cancer: Colonoscopy 01/2016 Dr. Ardis Hughs; 44mm polyp removed, not retrieved.  Recall colonoscopy 10 year interval recommended.  3. Persistent noncompliance, substance abuse. Often does not follow recommend medicine doses, continues to drink, continues to do illicit substances. Showed up for outpatient colonoscopy and upper endoscopy after snorting heroin and drinking beer.   HPI: This is a 61 year old man whom I last saw 3 or 4 months ago. He is here with his wife today. They both appear inebriated.  Chief complaint is cirrhosis, needs referral for a new PCP  nauseas with iron.  No vomiting blood, no black colored stools, no significant abdominal pains.  Drinking still; occasional  Denies other illicit drugs.  He's down 14 pounds since OV 3 months ago.  ROS: complete GI ROS as described in HPI.  Constitutional:  No unintentional weight loss   Past Medical History:  Diagnosis Date  . Alcohol  abuse   . Anemia   . Arthritis   . Ascites   . Cirrhosis ([place])   . Heart murmur   . Hyperlipidemia   . Hypertension   . Leg swelling   . Myocardial infarction ([place]) 2012  . Stroke [facility]) 2012   no deficits  . Thrombocytopenia ([place])     Past Surgical History:  Procedure Laterality Date  . COLONOSCOPY WITH PROPOFOL N/A 02/07/2016   Procedure: COLONOSCOPY WITH PROPOFOL;  Surgeon: Milus Banister, MD;  [facility];  Service: Endoscopy;  Laterality: N/A;  . ESOPHAGOGASTRODUODENOSCOPY (EGD) WITH PROPOFOL N/A 02/07/2016   Procedure: ESOPHAGOGASTRODUODENOSCOPY (EGD) WITH PROPOFOL;  Surgeon: Milus Banister, MD;  [facility];  Service: Endoscopy;  Laterality: N/A;  . HERNIA REPAIR Right    inguinal  . KNEE ARTHROSCOPY     bilateral/  12/14  . TOTAL KNEE ARTHROPLASTY Right 05/02/2013   Procedure: RIGHT TOTAL KNEE ARTHROPLASTY;  Surgeon: Mauri Pole, MD;  [facility];  Service: Orthopedics;  Laterality: Right;    Current Outpatient Prescriptions  Medication Sig Dispense Refill  . furosemide (LASIX) 20 MG tablet Take 1 tablet (20 mg total) by mouth 2 (two) times daily. 60 tablet 6  . nadolol (CORGARD) 20 MG tablet Take 1 tablet (20 mg total) by mouth daily. 30 tablet 6  . omeprazole (PRILOSEC) 20 MG capsule Take 1 capsule (20 mg total) by mouth 2 (two) times daily before a meal. 90 capsule 3  . spironolactone (ALDACTONE) 50 MG tablet Take 2 tablets (100 mg total) by mouth 2 (two) times daily. 120 tablet 6   No current facility-administered medications for this visit.     Allergies as  of 08/12/2016  . (No Known Allergies)    Family History  Problem Relation Age of Onset  . Hypertension Father   . Diabetes Father   . Dementia Mother   . Lupus Sister     Social History   Social History  . Marital status: Married    Spouse name: N/A  . Number of children: 4  . 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 Smoking status: Former Smoker    Packs/day: 0.50    Years: 10.00    Types: Cigarettes    Quit date: 01/05/2016  . Smokeless tobacco: Never Used     Comment: 4 cigarettes a day  . Alcohol use No     Comment: - quit- beer  80 oz  week  . Drug use: Yes    Types: Marijuana, Heroin     Comment: took about 1 month ago  . Sexual activity: Not on file     Comment: now and then smokies marijuana   Other Topics Concern  . Not on file   Social History Narrative  . No narrative on file     Physical Exam: BP 104/60   Pulse 68   Ht 5\' 7"  (1.702 m)   Wt 147 lb 12.8 oz (67 kg)   BMI 23.15 kg/m  Constitutional: Chronically ill appearing, appears high on something Psychiatric: alert and oriented x3 Abdomen: soft, nontender, nondistended, no obvious ascites, no peritoneal signs, normal bowel sounds No peripheral edema noted in lower extremities  Assessment and plan: 61 y.o. male with cirrhosis from alcohol abuse, intermittent other illicit substances as well  He asked again about referral for a new primary care physician and we will try to arrange that although I made no promises.  He is still drinking at least intermittently. He appears high on something today and that does not surprise me given his illicit substances in the past. He needs hepatoma screening which I'm going to arrange. I'm going to restage his liver disease with CBC, complete metabolic profile and coags. He has not been on proton pump inhibitor for about a month from what his wife can recall and so I'm going to check stool H. pylori antigen since he only was able to complete 7 days of the Pylera antibiotic treatment.   Please see the "Patient Instructions" section for addition details about the plan.  Owens Loffler, MD [facility] 08/12/2016, 4:06 PM

## 2016-08-12 NOTE — Patient Instructions (Addendum)
You will have labs checked today in the basement lab.  Please head down after you check out with the front desk  (cbc, cmet, inr, AFP).  Stool testing for H. Pylori antigen (since you've been off PPI for 4 weeks now).  You will be set up for an ultrasound for hepatoma screening.  You have been scheduled for an abdominal ultrasound at [facility]iology (1st floor of hospital) on 08/15/16 at 1130 am. Please arrive 15 minutes prior to your appointment for registration. Make certain not to have anything to eat or drink 6 hours prior to your appointment. Should you need to reschedule your appointment, please contact radiology at [phone]. This test typically takes about 30 minutes to perform.  We will help with arranging new PCP (through [facility] or other).  We will call you with appointment information.

## 2016-08-13 LAB — AFP TUMOR MARKER: AFP-Tumor Marker: 4 ng/mL (ref <6.1)

## 2016-08-15 ENCOUNTER — Other Ambulatory Visit: Payer: Self-pay

## 2016-08-15 ENCOUNTER — Ambulatory Visit (HOSPITAL_COMMUNITY): Payer: BC Managed Care – PPO | Attending: Gastroenterology

## 2016-08-15 DIAGNOSIS — K7460 Unspecified cirrhosis of liver: Secondary | ICD-10-CM

## 2016-08-20 ENCOUNTER — Other Ambulatory Visit: Payer: Self-pay | Admitting: Physician Assistant

## 2016-08-21 ENCOUNTER — Other Ambulatory Visit: Payer: BC Managed Care – PPO

## 2016-08-22 LAB — HELICOBACTER PYLORI  SPECIAL ANTIGEN: H. PYLORI Antigen: NOT DETECTED

## 2016-10-27 ENCOUNTER — Other Ambulatory Visit: Payer: Self-pay | Admitting: Physician Assistant

## 2016-10-28 ENCOUNTER — Other Ambulatory Visit: Payer: Self-pay | Admitting: *Deleted

## 2016-10-28 ENCOUNTER — Telehealth: Payer: Self-pay | Admitting: *Deleted

## 2016-10-28 NOTE — Telephone Encounter (Signed)
Per Dr. Ardis Hughs, [facility] to send refills for the Spironolactone 50 mg, to his pharmacy. Enough to get him through to his appointment, next available.   Also sent a letter to the patient advising him of an appointment to see Dr. Ardis Hughs, his next available 12-31-2016 at 11:00 am.

## 2016-10-30 ENCOUNTER — Other Ambulatory Visit: Payer: Self-pay | Admitting: Gastroenterology

## 2016-12-31 ENCOUNTER — Ambulatory Visit: Payer: Self-pay | Admitting: Gastroenterology

## 2017-01-19 ENCOUNTER — Encounter: Payer: Self-pay | Admitting: Physician Assistant

## 2017-01-19 ENCOUNTER — Ambulatory Visit (INDEPENDENT_AMBULATORY_CARE_PROVIDER_SITE_OTHER): Payer: BC Managed Care – PPO | Admitting: Physician Assistant

## 2017-01-19 ENCOUNTER — Other Ambulatory Visit (INDEPENDENT_AMBULATORY_CARE_PROVIDER_SITE_OTHER): Payer: BC Managed Care – PPO

## 2017-01-19 VITALS — BP 118/62 | HR 72 | Ht 66.0 in | Wt 150.4 lb

## 2017-01-19 DIAGNOSIS — K7460 Unspecified cirrhosis of liver: Secondary | ICD-10-CM | POA: Diagnosis not present

## 2017-01-19 LAB — CBC WITH DIFFERENTIAL/PLATELET
Basophils Absolute: 0 10*3/uL (ref 0.0–0.1)
Basophils Relative: 1.1 % (ref 0.0–3.0)
Eosinophils Absolute: 0.4 10*3/uL (ref 0.0–0.7)
Eosinophils Relative: 13.5 % — ABNORMAL HIGH (ref 0.0–5.0)
HCT: 31.2 % — ABNORMAL LOW (ref 39.0–52.0)
Hemoglobin: 10.5 g/dL — ABNORMAL LOW (ref 13.0–17.0)
Lymphocytes Relative: 35.1 % (ref 12.0–46.0)
Lymphs Abs: 1.2 10*3/uL (ref 0.7–4.0)
MCHC: 33.6 g/dL (ref 30.0–36.0)
MCV: 107.8 fl — ABNORMAL HIGH (ref 78.0–100.0)
Monocytes Absolute: 0.4 10*3/uL (ref 0.1–1.0)
Monocytes Relative: 13.5 % — ABNORMAL HIGH (ref 3.0–12.0)
Neutro Abs: 1.2 10*3/uL — ABNORMAL LOW (ref 1.4–7.7)
Neutrophils Relative %: 36.8 % — ABNORMAL LOW (ref 43.0–77.0)
Platelets: 115 10*3/uL — ABNORMAL LOW (ref 150.0–400.0)
RBC: 2.89 Mil/uL — ABNORMAL LOW (ref 4.22–5.81)
RDW: 13.9 % (ref 11.5–15.5)
WBC: 3.3 10*3/uL — ABNORMAL LOW (ref 4.0–10.5)

## 2017-01-19 LAB — COMPREHENSIVE METABOLIC PANEL
ALT: 15 U/L (ref 0–53)
AST: 39 U/L — ABNORMAL HIGH (ref 0–37)
Albumin: 2.9 g/dL — ABNORMAL LOW (ref 3.5–5.2)
Alkaline Phosphatase: 125 U/L — ABNORMAL HIGH (ref 39–117)
BUN: 11 mg/dL (ref 6–23)
CO2: 25 mEq/L (ref 19–32)
Calcium: 8.7 mg/dL (ref 8.4–10.5)
Chloride: 107 mEq/L (ref 96–112)
Creatinine, Ser: 1.15 mg/dL (ref 0.40–1.50)
GFR: 82.49 mL/min (ref >60.00)
Glucose, Bld: 96 mg/dL (ref 70–99)
Potassium: 4.9 mEq/L (ref 3.5–5.1)
Sodium: 137 mEq/L (ref 135–145)
Total Bilirubin: 1.5 mg/dL — ABNORMAL HIGH (ref 0.2–1.2)
Total Protein: 6.9 g/dL (ref 6.0–8.3)

## 2017-01-19 LAB — FERRITIN: Ferritin: 465.2 ng/mL — ABNORMAL HIGH (ref 22.0–322.0)

## 2017-01-19 LAB — IBC PANEL
Iron: 223 ug/dL — ABNORMAL HIGH (ref 42–165)
Saturation Ratios: 87 % — ABNORMAL HIGH (ref 20.0–50.0)
Transferrin: 183 mg/dL — ABNORMAL LOW (ref 212.0–360.0)

## 2017-01-19 LAB — PROTIME-INR
INR: 1.5 ratio — ABNORMAL HIGH (ref 0.8–1.0)
Prothrombin Time: 15.7 s — ABNORMAL HIGH (ref 9.6–13.1)

## 2017-01-19 MED ORDER — NADOLOL 20 MG PO TABS: 20.0000 mg | ORAL_TABLET | Freq: Every day | ORAL | 6 refills | Status: DC

## 2017-01-19 MED ORDER — FUROSEMIDE 20 MG PO TABS: 20.0000 mg | ORAL_TABLET | Freq: Two times a day (BID) | ORAL | 6 refills | Status: DC

## 2017-01-19 MED ORDER — SPIRONOLACTONE 50 MG PO TABS: 100.0000 mg | ORAL_TABLET | Freq: Two times a day (BID) | ORAL | 2 refills | Status: DC

## 2017-01-19 NOTE — Patient Instructions (Signed)
We have sent all of your medications to your pharmacy for you to pick up at your convenience.  Your physician has requested that you go to the basement for lab work before leaving today.  You have been scheduled for an abdominal ultrasound at [facility] (1st floor of hospital) on 01-23-17 at 9:30 am. Please arrive 15 minutes prior to your appointment for registration. Make certain not to have anything to eat or drink 6 hours prior to your appointment. Should you need to reschedule your appointment, please contact radiology at [phone]. This test typically takes about 30 minutes to perform.  We have sent in a referral to [facility] at [facility]. There address is [address], [place],  [address]. You will see Dr. Grier Mitts on 01/27/17 at 1 pm. Please bring a copy of your insurance cards, photo ID, and new patient paperwork. They do charge a no show fee of $50 so please call if you need to reschedule.

## 2017-01-19 NOTE — Progress Notes (Signed)
Chief Complaint:  Review of pertinent gastrointestinal problems:  1. Cirrhosis (likely related to alcohol; blood work May 2012 show hepatitis C negative, hepatitis B surface antigen negative, hepatitis B surface antibody negative, iron levels low, ANA negative, AMA negative, anti-smooth muscle antibody weakly positive)   Labs 05/2011: Tbili 2.1, platelets 120s, INR around 1.6. Albumin 1.9, AST 80s, ALT normal   Labs 08/12/16: INR 1.4, platelets 98, T bili 1.9, albumin 3.1, AST 32, ALT 12, alkaline phosphatase 102  EGD, September 2012 found small to medium esophageal varices, portal gastropathy, no gastric varices. Nadolol was started.  EGD 01/2016 small esophageal varices, moderate portal gastropathy, gastritis (H. Pylori +, was started on appropriate antibiotics but he never took them.  Again prescribed pylera 04/2016, he completed 7 days)-H. pylori fecal antigen 08/21/16 negative  Most recent imaging: 09/2015 gallstones+, cirrhosis without masses in liver   Most recent alpha-fetoprotein: 09/2015 normal  Duplex US 3/12 showed portal vein thrombus with NO flow in main portal vein   Hepatitis A/B immunization series started 05/2011   2. Average risk for colon cancer: Colonoscopy 01/2016 Dr. Ardis [name]; 51mm polyp removed, not retrieved.  Recall colonoscopy 10 year interval recommended.   3. Persistent noncompliance, substance abuse. Often does not follow recommend medicine doses, continues to drink, continues to do illicit substances. Showed up for outpatient colonoscopy and upper endoscopy after snorting heroin and drinking beer.  HPI:   Mark [name] is a 61 year old male with a past medical history of cirrhosis due to illicit drug use and alcohol, who follows with Dr. Ardis [name] and presents to clinic today for routine follow-up.   Please recall patient was last seen in clinic 08/12/16 and was noted to be inebriated at that time. He complained of nausea with his iron and admitted to continued drinking.  At that time, an ultrasound was ordered as well as repeat labs. Patient never appeared for his abdominal ultrasound. His labs were reviewed by Dr. Ardis [name] and were around his normal.  Patient then called our clinic 10/28/16 for a refill of his Spironolactone 50 mg. This was refilled through to his next appointment. This was supposed to be scheduled 12/31/16 with Dr. Ardis [name] but patient did not show or schedule this appointment.    Today, the patient presents to clinic alone. He is a very poor historian. He is unsure exactly what medications he is taking but "I'm on 3 of the pills". Patient tells me that he was going to come to his last appointment but when he looked at his calender it was a Sunday. Patient notes that he has continued to drink beer occasionally but has not had any illicit drugs for the past 3 months. Patient was unaware of imaging schedule at his last appointment and this is why he didn't show up. He is still requesting a PCP. He denies any abdominal pain or GI discomfort.   Patient denies fever, chills, blood in the stool, melena, weight loss, anorexia, nausea, vomiting or symptoms that awaken him at night.  Past Medical History:  Diagnosis Date  . Alcohol abuse   . Anemia   . Arthritis   . Ascites   . Cirrhosis ([place])   . Heart murmur   . Hyperlipidemia   . Hypertension   . Leg swelling   . Myocardial infarction ([place]) 2012  . Stroke [name]) 2012   no deficits  . Thrombocytopenia ([place])     Past Surgical History:  Procedure Laterality Date  . COLONOSCOPY WITH PROPOFOL N/A 02/07/2016  Procedure: COLONOSCOPY WITH PROPOFOL;  Surgeon: Milus Banister, MD;  [facility];  Service: Endoscopy;  Laterality: N/A;  . ESOPHAGOGASTRODUODENOSCOPY (EGD) WITH PROPOFOL N/A 02/07/2016   Procedure: ESOPHAGOGASTRODUODENOSCOPY (EGD) WITH PROPOFOL;  Surgeon: Milus Banister, MD;  [facility];  Service: Endoscopy;  Laterality: N/A;  . HERNIA REPAIR Right    inguinal  . KNEE  ARTHROSCOPY     bilateral/  12/14  . TOTAL KNEE ARTHROPLASTY Right 05/02/2013   Procedure: RIGHT TOTAL KNEE ARTHROPLASTY;  Surgeon: Mauri Pole, MD;  [facility];  Service: Orthopedics;  Laterality: Right;    Current Outpatient Prescriptions  Medication Sig Dispense Refill  . furosemide (LASIX) 20 MG tablet take 1 tablet by mouth twice a day 60 tablet 6  . nadolol (CORGARD) 20 MG tablet take 1 tablet by mouth once daily 30 tablet 6  . omeprazole (PRILOSEC) 20 MG capsule Take 1 capsule (20 mg total) by mouth 2 (two) times daily before a meal. 90 capsule 3  . spironolactone (ALDACTONE) 50 MG tablet Take 2 tablets (100 mg total) by mouth 2 (two) times daily. Patient needs to keep appointment with Dr. Ardis [name] 12-31-2016 at 11:00 am. 120 tablet 2   No current facility-administered medications for this visit.     Allergies as of 01/19/2017  . (No Known Allergies)    Family History  Problem Relation Age of Onset  . Hypertension Father   . Diabetes Father   . Dementia Mother   . Lupus Sister     Social History   Social History  . Marital status: Married    Spouse name: N/A  . Number of children: 4  . 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 Smoking status: Former Smoker    Packs/day: 0.50    Years: 10.00    Types: Cigarettes    Quit date: 01/05/2016  . Smokeless tobacco: Never Used     Comment: 4 cigarettes a day  . Alcohol use No     Comment: - quit- beer  80 oz  week  . Drug use: Yes    Types: Marijuana, Heroin     Comment: took about 1 month ago  . Sexual activity: Not on file     Comment: now and then smokies marijuana   Other Topics Concern  . Not on file   Social History Narrative  . No narrative on file    Review of Systems:    Constitutional: No fever or chills Cardiovascular: No chest pain Respiratory: No SOB  Gastrointestinal: See HPI and otherwise negative   Physical Exam:    Vital signs: BP 118/62   Pulse 72   Ht 5\' 6"  (1.676 m)   Wt 150 lb 6.4 oz (68.2 kg)   BMI 24.28 kg/m    Constitutional: AA male appears to be in NAD, Well developed, Well nourished, alert and cooperative Respiratory: Respirations even and unlabored. Lungs clear to auscultation bilaterally.   No wheezes, crackles, or rhonchi.  Cardiovascular: Normal S1, S2. No MRG. Regular rate and rhythm. No peripheral edema, cyanosis or pallor.  Gastrointestinal:  Soft, nondistended, nontender. No rebound or guarding. Normal bowel sounds. No appreciable masses or hepatomegaly. Skin:   Dry and intact without significant lesions or rashes. Psychiatric: Slowed mentation  MOST RECENT LABS AND IMAGING: CBC    Component Value Date/Time   WBC 2.8 (L) 08/12/2016 1631   RBC  2.94 (L) 08/12/2016 1631   HGB 10.6 (L) 08/12/2016 1631   HCT 30.5 (L) 08/12/2016 1631   PLT 98.0 (L) 08/12/2016 1631   MCV 103.5 (H) 08/12/2016 1631   MCH 31.7 06/29/2014 1640   MCHC 34.9 08/12/2016 1631   RDW 15.2 08/12/2016 1631   LYMPHSABS 0.8 08/12/2016 1631   MONOABS 0.5 08/12/2016 1631   EOSABS 0.2 08/12/2016 1631   BASOSABS 0.0 08/12/2016 1631    CMP     Component Value Date/Time   NA 129 (L) 08/12/2016 1631   K 4.6 08/12/2016 1631   CL 100 08/12/2016 1631   CO2 25 08/12/2016 1631   GLUCOSE 109 (H) 08/12/2016 1631   BUN 11 08/12/2016 1631   CREATININE 1.20 08/12/2016 1631   CREATININE 0.74 12/05/2013 1320   CALCIUM 9.2 08/12/2016 1631   PROT 7.3 08/12/2016 1631   ALBUMIN 3.1 (L) 08/12/2016 1631   AST 32 08/12/2016 1631   ALT 12 08/12/2016 1631   ALKPHOS 102 08/12/2016 1631   BILITOT 1.9 (H) 08/12/2016 1631   GFRNONAA 45 (L) 05/03/2013 0505   GFRAA 52 (L) 05/03/2013 0505    Assessment: 1. Cirrhosis: Due to alcohol abuse and intermittent illicit substances, continue to use etoh, denies recent illicit substances in last 3 mos  Plan: 1. Ordered CBC, CMP, AFP and iron studies 2. Reordered right upper  quadrant ultrasound 3. Refilled patient's medications for the next 4 months 4. Continue to recommend patient abstain from alcohol and illicit drugs 5. Patient continues to request a PCP. We will try and find him one today 6. Patient to follow in clinic with Dr. Ardis [name] in 3-4 months  Ellouise Newer, PA-C [facility] 01/19/2017, 10:02 AM

## 2017-01-20 LAB — AFP TUMOR MARKER: AFP-Tumor Marker: 3.3 ng/mL (ref <6.1)

## 2017-01-20 NOTE — Progress Notes (Signed)
I agree with the above note, plan 

## 2017-01-23 ENCOUNTER — Ambulatory Visit (HOSPITAL_COMMUNITY)
Admission: RE | Admit: 2017-01-23 | Discharge: 2017-01-23 | Disposition: A | Discharge status: Home / Self Care | Payer: BC Managed Care – PPO | Source: Ambulatory Visit | Attending: Gastroenterology | Admitting: Gastroenterology

## 2017-01-23 DIAGNOSIS — R938 Abnormal findings on diagnostic imaging of other specified body structures: Secondary | ICD-10-CM | POA: Insufficient documentation

## 2017-01-23 DIAGNOSIS — K8020 Calculus of gallbladder without cholecystitis without obstruction: Secondary | ICD-10-CM | POA: Insufficient documentation

## 2017-01-23 DIAGNOSIS — N281 Cyst of kidney, acquired: Secondary | ICD-10-CM | POA: Diagnosis not present

## 2017-01-23 DIAGNOSIS — K7460 Unspecified cirrhosis of liver: Secondary | ICD-10-CM | POA: Insufficient documentation

## 2017-01-27 ENCOUNTER — Ambulatory Visit: Payer: Self-pay | Admitting: Family Medicine

## 2017-02-02 ENCOUNTER — Ambulatory Visit (INDEPENDENT_AMBULATORY_CARE_PROVIDER_SITE_OTHER): Payer: BC Managed Care – PPO | Admitting: Family Medicine

## 2017-02-02 ENCOUNTER — Encounter: Payer: Self-pay | Admitting: Family Medicine

## 2017-02-02 VITALS — BP 110/70 | HR 93 | Temp 97.8°F | Ht 66.5 in | Wt 146.5 lb

## 2017-02-02 DIAGNOSIS — M25561 Pain in right knee: Secondary | ICD-10-CM | POA: Diagnosis not present

## 2017-02-02 DIAGNOSIS — N529 Male erectile dysfunction, unspecified: Secondary | ICD-10-CM

## 2017-02-02 DIAGNOSIS — K7030 Alcoholic cirrhosis of liver without ascites: Secondary | ICD-10-CM

## 2017-02-02 DIAGNOSIS — Z7689 Persons encountering health services in other specified circumstances: Secondary | ICD-10-CM

## 2017-02-02 NOTE — Progress Notes (Signed)
Patient presents to clinic today to establish care.  Pt accompanied by his wife.  SUBJECTIVE: PMH: Pt is a 61 yo with pmh sig for esophageal varices, cirrhosis of the liver 2/2 EtOH, portal hypertension, CKD stage 3, s/p R TKR, anemia-iron deficiency s/p txf.  Pt never really seen by a PCP.  Pt is here as was told would need a PCP before able to have Cataract surgery.  When asked about CKD, portal HTN, and esophageal varices pt seemed shocked like he never heard any of this before.  R knee pain, chronic: -s/p R TKR ~ 3 yrs ago -pain 10/10 -gets oxycodone from "friend on the street"  -unable to recall when started. -states went to f/u with surgeon but was told didn't have to f/u anymore.  Cirrhosis: -2/2 EtOH abuse -pt still drinking beer. Two 40 oz per day (per wife).   -pt states he hasn't had liquor in a while. -followed by Dr. Owens Loffler-- GI -had liver u/s last wk.  No masses seen per pt.  ED, acute complaint: -pt complains of inability to get an erection -noticed after pt had hernia surgery Nov 2011 -Pt wants meds. -states tried some viagra from a friend, but it did not work  Allergies: NKDA, NKFA  PSurgHx: R TKR  2015 Hernia repair 2014 in [place] Hx: Pt has been married for 25 years.  He and his wife have been together x 45 yrs.  Pt has not worked in the last 3 yrs, he is retired.  He has worked as a Horticulturist, commercial, a Dealer, and a Development worker, international aid at [employer].  Pt endorses tobacco use, 2-3 cigs/day.  Pt started smoking at age 22.  He currently drinks beer daily, 1-2 40 oz beers.  Pt endorses heroin use.  Last use was yesterday 02/01/17.  Pt states he uses b/c he is bored.   Health Maintenance: Dental -- mostly ednetulous   last tetanus 2000  Past Medical History:  Diagnosis Date  . Alcohol abuse   . Anemia   . Arthritis   . Ascites   . Cirrhosis ([place])   . Heart murmur   . Hyperlipidemia   . Hypertension   . Leg swelling   . Myocardial infarction  ([place]) 2012  . Stroke [facility]) 2012   no deficits  . Thrombocytopenia ([place])     Past Surgical History:  Procedure Laterality Date  . COLONOSCOPY WITH PROPOFOL N/A 02/07/2016   Procedure: COLONOSCOPY WITH PROPOFOL;  Surgeon: Milus Banister, MD;  Location: [facility];  Service: Endoscopy;  Laterality: N/A;  . ESOPHAGOGASTRODUODENOSCOPY (EGD) WITH PROPOFOL N/A 02/07/2016   Procedure: ESOPHAGOGASTRODUODENOSCOPY (EGD) WITH PROPOFOL;  Surgeon: Milus Banister, MD;  Location: [facility];  Service: Endoscopy;  Laterality: N/A;  . HERNIA REPAIR Right    inguinal  . KNEE ARTHROSCOPY     bilateral/  12/14  . TOTAL KNEE ARTHROPLASTY Right 05/02/2013   Procedure: RIGHT TOTAL KNEE ARTHROPLASTY;  Surgeon: Mauri Pole, MD;  Location: [facility] ORS;  Service: Orthopedics;  Laterality: Right;    Current Outpatient Prescriptions on File Prior to Visit  Medication Sig Dispense Refill  . furosemide (LASIX) 20 MG tablet Take 1 tablet (20 mg total) by mouth 2 (two) times daily. 60 tablet 6  . nadolol (CORGARD) 20 MG tablet Take 1 tablet (20 mg total) by mouth daily. 30 tablet 6  . spironolactone (ALDACTONE) 50 MG tablet Take 2 tablets (100 mg total) by mouth 2 (two) times daily.  120 tablet 2   No current facility-administered medications on file prior to visit.     No Known Allergies  Family History  Problem Relation Age of Onset  . Hypertension Father   . Diabetes Father   . Dementia Mother   . Lupus Sister     Social History   Social History  . Marital status: Married    Spouse name: N/A  . Number of children: 4  . 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 Smoking status: Former Smoker    Packs/day: 0.50    Years: 10.00    Types: Cigarettes    Quit date: 01/05/2016  . Smokeless tobacco: Never Used     Comment: 4 cigarettes a day  . Alcohol use No     Comment: 40 oz per week as of 01-19-17  . Drug use: Yes    Types:  Marijuana, Heroin     Comment: took about 1 month ago- as of 01-19-17  . Sexual activity: Not on file     Comment: now and then smokies marijuana   Other Topics Concern  . Not on file   Social History Narrative  . No narrative on file    ROS General: Denies fever, chills, night sweats, changes in weight, changes in appetite HEENT: Denies headaches, ear pain, rhinorrhea, sore throat  +changes in vision 2/2 cataracts CV: Denies CP, palpitations, SOB, orthopnea Pulm: Denies SOB, cough, wheezing GI: Denies abdominal pain, nausea, vomiting, diarrhea, constipation GU: Denies dysuria, hematuria, frequency, vaginal discharge  + erectile dysfunction Msk: Denies muscle cramps, joint pains  + right knee pain Neuro: Denies weakness, numbness, tingling Skin: Denies rashes, bruising Psych: Denies depression, anxiety, hallucinations  BP 110/70 (BP Location: Right Arm, Patient Position: Sitting, Cuff Size: Normal)   Pulse 93   Temp 97.8 F (36.6 C) (Oral)   Ht 5' 6.5" (1.689 m)   Wt 146 lb 8 oz (66.5 kg)   BMI 23.29 kg/m   Physical Exam  Gen. Pleasant, well developed, well-nourished, in NAD HEENT - [place]/AT, PERRL, partial cataract noted b/l, scleral discoloration but no icterus, no nasal drainage, pharynx without erythema or exudate. Neck: No JVD, no thyromegaly Lungs: no use of accessory muscles, CTAB, no wheezes, rales or rhonchi Cardiovascular: RRR, 2/6 murmur, No r/g, no peripheral edema Abdomen: BS present, soft, nontender,nondistended Musculoskeletal: moves all four extremities, no cyanosis or clubbing, normal tone Neuro:  A&Ox3, CN II-XII intact, normal gait Skin:  Warm, dry, intact, no lesions Psych: normal affect,  Mood appropriate.   Had some difficulty answering questions- delayed responses, at times forgetting questions he wanted to ask.  Assessment/Plan: Alcoholic cirrhosis of liver without ascites ([name]) - GI, Dr. Owens Loffler -Discussed importance of medication  compliance -Discussed importance of stopping drinking -Advised to continue Lasix 20 mg,Nadolol 20 mg, Spironolactone 50 mg -Given handout  Erectile dysfunction, unspecified erectile dysfunction type -Discussed possible causes medications, past surgery, current medical conditions -Attempted to explain how medication for ED can causes hypotension given pt's current health issues and medications.  Pt not interested in S/Es, more interested in achieving erection now.  [name] wife understanding.  Recurrent pain of right knee -Advised pt to f/u with surgeon -Advised pt to refrain from taking other ppl's medication or illicit drugs.  Encounter to establish care -records release   F/u prn. Pt did not have any forms for cataract surgery with him this  visit.  Discussed f/u visit to complete papers and further discuss ED.

## 2017-02-02 NOTE — Patient Instructions (Addendum)
Alcoholic Liver Disease Alcoholic liver disease happens when the liver does not work the way it should. The condition is caused by drinking too much alcohol for many years. Follow these instructions at home:  Do not drink alcohol.  Take medicines only as told by your doctor.  Take vitamins only as told by your doctor.  Follow any diet instructions that your doctor gave you. You may need to: ? Eat foods that have thiamine. These include whole-wheat cereals, pork, and raw vegetables. ? Eat foods that have folic acid. These include vegetables, fruits, meats, beans, nuts, and dairy foods. ? Eat foods that are high in carbohydrates. These include yogurt, beans, potatoes, and rice. Contact a doctor if:  You have a fever.  You are short of breath.  You have trouble breathing.  You have bright red blood in your poop (stool).  Your poop looks like tar.  You throw up (vomit) blood.  Your skin looks more yellow, pale, or dark.  You get headaches.  You have trouble thinking.  You have trouble balancing or walking. This information is not intended to replace advice given to you by your health care provider. Make sure you discuss any questions you have with your health care provider. Document Released: 02/09/2009 Document Revised: 09/20/2015 Document Reviewed: 03/16/2014 Elsevier Interactive Patient Education  2018 [employer].  Esophageal Varices The esophagus is the passage that connects the throat to the stomach. Esophageal varices are blood vessels in the esophagus that have become enlarged. They develop when extra blood is forced to flow through them because the blood's normal pathway is blocked. Without treatment these blood vessels eventually break and bleed (hemorrhage). A hemorrhage is life-threatening. What are the causes? This condition may be caused by:  Scarring of the liver (cirrhosis) due to alcoholism. This is the most common cause.  Liver disease.  Severe heart  failure.  A blood clot in the portal vein.  Sarcoidosis. This is an inflammatory disease that can affect the liver.  Schistosomiasis. This is a parasitic infection that can cause liver damage.  What are the signs or symptoms? Usually there are no symptoms unless the esophageal varices bleed. Symptoms of bleeding esophageal varices include:  Vomiting material that is bright red or that is black and looks like coffee grounds.  Coughing up blood.  Black, tarry stools.  Dizziness or lightheadedness.  Low blood pressure.  Loss of consciousness.  How is this diagnosed? This condition is diagnosed with tests, such as:  Endoscopy. During this test a thin, lighted tube is inserted through the mouth and into the esophagus.  Blood tests. These may be done to check liver function, blood counts, and the body's ability to form blood clots.  How is this treated? This condition may be treated with:  Medicines that reduce pressure in the esophageal varices and reduce the risk of bleeding.  Procedures to reduce pressure in the esophageal varices and reduce the risk of bleeding or stop bleeding. These include: ? Variceal ligation. In this procedure, a rubber band is placed around the esophageal varices to keep them from bleeding. ? Injection therapy. This treatment involves an injection that causes the esophageal varices to shrink and close (sclerotherapy). Medicines that tighten blood vessels or alter blood flow may also be used. ? Balloon tamponade. In this procedure, a tube is put into the esophagus and a balloon is passed through it and inflated. ? Transjugular intrahepatic portosystemic shunt (TIPS) placement. In this procedure, a small tube is placed within  the liver veins. This decreases blood flow and pressure to the esophageal varices.  A liver transplant. This may be done if other treatments do not work.  Follow these instructions at home:  Take medicines only as directed by your  health care provider.  Follow your health care provider's instructions about rest and physical activity. Get help right away if:  You have any symptoms of this condition after treatment.  You are unable to eat or drink.  You have chest pain. This information is not intended to replace advice given to you by your health care provider. Make sure you discuss any questions you have with your health care provider. Document Released: 07/05/2003 Document Revised: 09/20/2015 Document Reviewed: 04/10/2014 Elsevier Interactive Patient Education  2018 [employer].  Erectile Dysfunction Erectile dysfunction (ED) is the inability to get or keep an erection in order to have sexual intercourse. Erectile dysfunction may include:  Inability to get an erection.  Lack of enough hardness of the erection to allow penetration.  Loss of the erection before sex is finished.  What are the causes? This condition may be caused by:  Certain medicines, such as: ? Pain relievers. ? Antihistamines. ? Antidepressants. ? Blood pressure medicines. ? Water pills (diuretics). ? Ulcer medicines. ? Muscle relaxants. ? Drugs.  Excessive drinking.  Psychological causes, such as: ? Anxiety. ? Depression. ? Sadness. ? Exhaustion. ? Performance fear. ? Stress.  Physical causes, such as: ? Artery problems. This may include diabetes, smoking, liver disease, or atherosclerosis. ? High blood pressure. ? Hormonal problems, such as low testosterone. ? Obesity. ? Nerve problems. This may include back or pelvic injuries, diabetes mellitus, multiple sclerosis, or Parkinson disease.  What are the signs or symptoms? Symptoms of this condition include:  Inability to get an erection.  Lack of enough hardness of the erection to allow penetration.  Loss of the erection before sex is finished.  Normal erections at some times, but with frequent unsatisfactory episodes.  Low sexual satisfaction in either  partner due to erection problems.  A curved penis occurring with erection. The curve may cause pain or the penis may be too curved to allow for intercourse.  Never having nighttime erections.  How is this diagnosed? This condition is often diagnosed by:  Performing a physical exam to find other diseases or specific problems with the penis.  Asking you detailed questions about the problem.  Performing blood tests to check for diabetes mellitus or to measure hormone levels.  Performing other tests to check for underlying health conditions.  Performing an ultrasound exam to check for scarring.  Performing a test to check blood flow to the penis.  Doing a sleep study at home to measure nighttime erections.  How is this treated? This condition may be treated by:  Medicine taken by mouth to help you achieve an erection (oral medicine).  Hormone replacement therapy to replace low testosterone levels.  Medicine that is injected into the penis. Your health care provider may instruct you how to give yourself these injections at home.  Vacuum pump. This is a pump with a ring on it. The pump and ring are placed on the penis and used to create pressure that helps the penis become erect.  Penile implant surgery. In this procedure, you may receive: ? An inflatable implant. This consists of cylinders, a pump, and a reservoir. The cylinders can be inflated with a fluid that helps to create an erection, and they can be deflated after intercourse. ?  A semi-rigid implant. This consists of two silicone rubber rods. The rods provide some rigidity. They are also flexible, so the penis can both curve downward in its normal position and become straight for sexual intercourse.  Blood vessel surgery, to improve blood flow to the penis. During this procedure, a blood vessel from a different part of the body is placed into the penis to allow blood to flow around (bypass) damaged or blocked blood  vessels.  Lifestyle changes, such as exercising more, losing weight, and quitting smoking.  Follow these instructions at home: Medicines  Take over-the-counter and prescription medicines only as told by your health care provider. Do not increase the dosage without first discussing it with your health care provider.  If you are using self-injections, perform injections as directed by your health care provider. Make sure to avoid any veins that are on the surface of the penis. After giving an injection, apply pressure to the injection site for 5 minutes. General instructions  Exercise regularly, as directed by your health care provider. Work with your health care provider to lose weight, if needed.  Do not use any products that contain nicotine or tobacco, such as cigarettes and e-cigarettes. If you need help quitting, ask your health care provider.  Before using a vacuum pump, read the instructions that come with the pump and discuss any questions with your health care provider.  Keep all follow-up visits as told by your health care provider. This is important. Contact a health care provider if:  You feel nauseous.  You vomit. Get help right away if:  You are taking oral or injectable medicines and you have an erection that lasts longer than 4 hours. If your health care provider is unavailable, go to the nearest emergency room for evaluation. An erection that lasts much longer than 4 hours can result in permanent damage to your penis.  You have severe pain in your groin or abdomen.  You develop redness or severe swelling of your penis.  You have redness spreading up into your groin or lower abdomen.  You are unable to urinate.  You experience chest pain or a rapid heart beat (palpitations) after taking oral medicines. Summary  Erectile dysfunction (ED) is the inability to get or keep an erection during sexual intercourse. This problem can usually be treated successfully.  This  condition is diagnosed based on a physical exam, your symptoms, and tests to determine the cause. Treatment varies depending on the cause, and may include medicines, hormone therapy, surgery, or vacuum pump.  You may need follow-up visits to make sure that you are using your medicines or devices correctly.  Get help right away if you are taking or injecting medicines and you have an erection that lasts longer than 4 hours. This information is not intended to replace advice given to you by your health care provider. Make sure you discuss any questions you have with your health care provider. Document Released: 04/11/2000 Document Revised: 04/30/2016 Document Reviewed: 04/30/2016 Elsevier Interactive Patient Education  2017 [employer].

## 2017-06-17 ENCOUNTER — Emergency Department (HOSPITAL_COMMUNITY): Payer: BC Managed Care – PPO

## 2017-06-17 ENCOUNTER — Inpatient Hospital Stay (HOSPITAL_COMMUNITY): Payer: BC Managed Care – PPO

## 2017-06-17 ENCOUNTER — Encounter (HOSPITAL_COMMUNITY): Payer: Self-pay | Admitting: Emergency Medicine

## 2017-06-17 ENCOUNTER — Inpatient Hospital Stay (HOSPITAL_COMMUNITY)
Admission: EM | Admit: 2017-06-17 | Discharge: 2017-06-27 | DRG: 871 | Disposition: A | Discharge status: Home Health | Payer: BC Managed Care – PPO | Attending: Internal Medicine | Admitting: Internal Medicine

## 2017-06-17 ENCOUNTER — Other Ambulatory Visit: Payer: Self-pay

## 2017-06-17 DIAGNOSIS — D62 Acute posthemorrhagic anemia: Secondary | ICD-10-CM | POA: Diagnosis present

## 2017-06-17 DIAGNOSIS — E8809 Other disorders of plasma-protein metabolism, not elsewhere classified: Secondary | ICD-10-CM | POA: Diagnosis present

## 2017-06-17 DIAGNOSIS — R278 Other lack of coordination: Secondary | ICD-10-CM | POA: Diagnosis present

## 2017-06-17 DIAGNOSIS — D696 Thrombocytopenia, unspecified: Secondary | ICD-10-CM | POA: Diagnosis present

## 2017-06-17 DIAGNOSIS — K920 Hematemesis: Secondary | ICD-10-CM

## 2017-06-17 DIAGNOSIS — K2980 Duodenitis without bleeding: Secondary | ICD-10-CM | POA: Diagnosis present

## 2017-06-17 DIAGNOSIS — A409 Streptococcal sepsis, unspecified: Secondary | ICD-10-CM | POA: Diagnosis present

## 2017-06-17 DIAGNOSIS — I129 Hypertensive chronic kidney disease with stage 1 through stage 4 chronic kidney disease, or unspecified chronic kidney disease: Secondary | ICD-10-CM | POA: Diagnosis present

## 2017-06-17 DIAGNOSIS — N179 Acute kidney failure, unspecified: Secondary | ICD-10-CM | POA: Diagnosis present

## 2017-06-17 DIAGNOSIS — R600 Localized edema: Secondary | ICD-10-CM | POA: Diagnosis not present

## 2017-06-17 DIAGNOSIS — K269 Duodenal ulcer, unspecified as acute or chronic, without hemorrhage or perforation: Secondary | ICD-10-CM

## 2017-06-17 DIAGNOSIS — E872 Acidosis: Secondary | ICD-10-CM | POA: Diagnosis present

## 2017-06-17 DIAGNOSIS — G9341 Metabolic encephalopathy: Secondary | ICD-10-CM | POA: Diagnosis present

## 2017-06-17 DIAGNOSIS — E871 Hypo-osmolality and hyponatremia: Secondary | ICD-10-CM | POA: Diagnosis present

## 2017-06-17 DIAGNOSIS — L03115 Cellulitis of right lower limb: Secondary | ICD-10-CM | POA: Diagnosis present

## 2017-06-17 DIAGNOSIS — K922 Gastrointestinal hemorrhage, unspecified: Secondary | ICD-10-CM | POA: Diagnosis not present

## 2017-06-17 DIAGNOSIS — I8510 Secondary esophageal varices without bleeding: Secondary | ICD-10-CM | POA: Diagnosis present

## 2017-06-17 DIAGNOSIS — N189 Chronic kidney disease, unspecified: Secondary | ICD-10-CM | POA: Diagnosis present

## 2017-06-17 DIAGNOSIS — K210 Gastro-esophageal reflux disease with esophagitis: Secondary | ICD-10-CM | POA: Diagnosis present

## 2017-06-17 DIAGNOSIS — Z79899 Other long term (current) drug therapy: Secondary | ICD-10-CM

## 2017-06-17 DIAGNOSIS — N183 Chronic kidney disease, stage 3 (moderate): Secondary | ICD-10-CM | POA: Diagnosis present

## 2017-06-17 DIAGNOSIS — R188 Other ascites: Secondary | ICD-10-CM

## 2017-06-17 DIAGNOSIS — D684 Acquired coagulation factor deficiency: Secondary | ICD-10-CM | POA: Diagnosis present

## 2017-06-17 DIAGNOSIS — K2981 Duodenitis with bleeding: Secondary | ICD-10-CM | POA: Diagnosis present

## 2017-06-17 DIAGNOSIS — K449 Diaphragmatic hernia without obstruction or gangrene: Secondary | ICD-10-CM | POA: Diagnosis present

## 2017-06-17 DIAGNOSIS — M7989 Other specified soft tissue disorders: Secondary | ICD-10-CM | POA: Diagnosis not present

## 2017-06-17 DIAGNOSIS — I9589 Other hypotension: Secondary | ICD-10-CM

## 2017-06-17 DIAGNOSIS — I252 Old myocardial infarction: Secondary | ICD-10-CM

## 2017-06-17 DIAGNOSIS — E860 Dehydration: Secondary | ICD-10-CM | POA: Diagnosis present

## 2017-06-17 DIAGNOSIS — D509 Iron deficiency anemia, unspecified: Secondary | ICD-10-CM | POA: Diagnosis present

## 2017-06-17 DIAGNOSIS — I2510 Atherosclerotic heart disease of native coronary artery without angina pectoris: Secondary | ICD-10-CM | POA: Diagnosis present

## 2017-06-17 DIAGNOSIS — K7030 Alcoholic cirrhosis of liver without ascites: Secondary | ICD-10-CM | POA: Diagnosis present

## 2017-06-17 DIAGNOSIS — F1010 Alcohol abuse, uncomplicated: Secondary | ICD-10-CM | POA: Diagnosis present

## 2017-06-17 DIAGNOSIS — N19 Unspecified kidney failure: Secondary | ICD-10-CM | POA: Diagnosis present

## 2017-06-17 DIAGNOSIS — K766 Portal hypertension: Secondary | ICD-10-CM | POA: Diagnosis present

## 2017-06-17 DIAGNOSIS — Z96651 Presence of right artificial knee joint: Secondary | ICD-10-CM | POA: Diagnosis present

## 2017-06-17 DIAGNOSIS — R6521 Severe sepsis with septic shock: Secondary | ICD-10-CM | POA: Diagnosis present

## 2017-06-17 DIAGNOSIS — A419 Sepsis, unspecified organism: Secondary | ICD-10-CM

## 2017-06-17 DIAGNOSIS — A400 Sepsis due to streptococcus, group A: Secondary | ICD-10-CM | POA: Diagnosis not present

## 2017-06-17 DIAGNOSIS — K7460 Unspecified cirrhosis of liver: Secondary | ICD-10-CM

## 2017-06-17 DIAGNOSIS — E861 Hypovolemia: Secondary | ICD-10-CM

## 2017-06-17 DIAGNOSIS — K7290 Hepatic failure, unspecified without coma: Secondary | ICD-10-CM | POA: Diagnosis not present

## 2017-06-17 DIAGNOSIS — K3189 Other diseases of stomach and duodenum: Secondary | ICD-10-CM | POA: Diagnosis not present

## 2017-06-17 DIAGNOSIS — F1110 Opioid abuse, uncomplicated: Secondary | ICD-10-CM | POA: Diagnosis present

## 2017-06-17 DIAGNOSIS — D649 Anemia, unspecified: Secondary | ICD-10-CM

## 2017-06-17 DIAGNOSIS — K209 Esophagitis, unspecified: Secondary | ICD-10-CM | POA: Diagnosis not present

## 2017-06-17 DIAGNOSIS — N182 Chronic kidney disease, stage 2 (mild): Secondary | ICD-10-CM | POA: Diagnosis not present

## 2017-06-17 DIAGNOSIS — Z8673 Personal history of transient ischemic attack (TIA), and cerebral infarction without residual deficits: Secondary | ICD-10-CM

## 2017-06-17 DIAGNOSIS — I959 Hypotension, unspecified: Secondary | ICD-10-CM | POA: Diagnosis present

## 2017-06-17 HISTORY — DX: Dehydration: E86.0

## 2017-06-17 HISTORY — DX: Sepsis, unspecified organism: A41.9

## 2017-06-17 LAB — URINALYSIS, ROUTINE W REFLEX MICROSCOPIC
Bilirubin Urine: NEGATIVE
Glucose, UA: NEGATIVE mg/dL
Ketones, ur: NEGATIVE mg/dL
Nitrite: NEGATIVE
Protein, ur: NEGATIVE mg/dL
Specific Gravity, Urine: 1.013 (ref 1.005–1.030)
pH: 5 (ref 5.0–8.0)

## 2017-06-17 LAB — COMPREHENSIVE METABOLIC PANEL
ALT: 24 U/L (ref 17–63)
AST: 72 U/L — ABNORMAL HIGH (ref 15–41)
Albumin: 2.3 g/dL — ABNORMAL LOW (ref 3.5–5.0)
Alkaline Phosphatase: 63 U/L (ref 38–126)
Anion gap: 12 (ref 5–15)
BUN: 76 mg/dL — ABNORMAL HIGH (ref 6–20)
CO2: 17 mmol/L — ABNORMAL LOW (ref 22–32)
Calcium: 7.5 mg/dL — ABNORMAL LOW (ref 8.9–10.3)
Chloride: 93 mmol/L — ABNORMAL LOW (ref 101–111)
Creatinine, Ser: 6.48 mg/dL — ABNORMAL HIGH (ref 0.61–1.24)
GFR calc Af Amer: 9 mL/min — ABNORMAL LOW (ref >60)
GFR calc non Af Amer: 8 mL/min — ABNORMAL LOW (ref >60)
Glucose, Bld: 55 mg/dL — ABNORMAL LOW (ref 65–99)
Potassium: 5.2 mmol/L — ABNORMAL HIGH (ref 3.5–5.1)
Sodium: 122 mmol/L — ABNORMAL LOW (ref 135–145)
Total Bilirubin: 2.1 mg/dL — ABNORMAL HIGH (ref 0.3–1.2)
Total Protein: 6.3 g/dL — ABNORMAL LOW (ref 6.5–8.1)

## 2017-06-17 LAB — I-STAT CG4 LACTIC ACID, ED
Lactic Acid, Venous: 2.01 mmol/L (ref 0.5–1.9)
Lactic Acid, Venous: 1.88 mmol/L (ref 0.5–1.9)

## 2017-06-17 LAB — CBC
HCT: 28.9 % — ABNORMAL LOW (ref 39.0–52.0)
Hemoglobin: 10.4 g/dL — ABNORMAL LOW (ref 13.0–17.0)
MCH: 33.7 pg (ref 26.0–34.0)
MCHC: 36 g/dL (ref 30.0–36.0)
MCV: 93.5 fL (ref 78.0–100.0)
Platelets: 63 10*3/uL — ABNORMAL LOW (ref 150–400)
RBC: 3.09 MIL/uL — ABNORMAL LOW (ref 4.22–5.81)
RDW: 12.6 % (ref 11.5–15.5)
WBC: 4.7 10*3/uL (ref 4.0–10.5)

## 2017-06-17 LAB — PROTIME-INR
INR: 1.38
Prothrombin Time: 16.8 seconds — ABNORMAL HIGH (ref 11.4–15.2)

## 2017-06-17 LAB — CK: Total CK: 75 U/L (ref 49–397)

## 2017-06-17 LAB — LIPASE, BLOOD: Lipase: 23 U/L (ref 11–51)

## 2017-06-17 LAB — MAGNESIUM: Magnesium: 1.1 mg/dL — ABNORMAL LOW (ref 1.7–2.4)

## 2017-06-17 LAB — I-STAT TROPONIN, ED: Troponin i, poc: 0 ng/mL (ref 0.00–0.08)

## 2017-06-17 LAB — AMMONIA: Ammonia: 114 umol/L — ABNORMAL HIGH (ref 9–35)

## 2017-06-17 LAB — PHOSPHORUS: Phosphorus: 6.1 mg/dL — ABNORMAL HIGH (ref 2.5–4.6)

## 2017-06-17 MED ORDER — SODIUM CHLORIDE 0.9 % IV BOLUS (SEPSIS)
2000.0000 mL | Freq: Once | INTRAVENOUS | Status: AC
  Administered 2017-06-17: 2000 mL via INTRAVENOUS

## 2017-06-17 MED ORDER — SODIUM CHLORIDE 0.9 % IV BOLUS (SEPSIS)
1000.0000 mL | Freq: Once | INTRAVENOUS | Status: AC
  Administered 2017-06-17: 1000 mL via INTRAVENOUS

## 2017-06-17 MED ORDER — ALBUMIN HUMAN 5 % IV SOLN
12.5000 g | Freq: Once | INTRAVENOUS | Status: AC
  Administered 2017-06-17: 12.5 g via INTRAVENOUS
  Filled 2017-06-17: qty 250

## 2017-06-17 MED ORDER — MAGNESIUM SULFATE 2 GM/50ML IV SOLN
2.0000 g | Freq: Once | INTRAVENOUS | Status: AC
  Administered 2017-06-18: 2 g via INTRAVENOUS
  Filled 2017-06-17: qty 50

## 2017-06-17 MED ORDER — VITAMIN B-1 100 MG PO TABS: 100.0000 mg | ORAL_TABLET | Freq: Once | ORAL | Status: DC

## 2017-06-17 MED ORDER — MAGNESIUM SULFATE 50 % IJ SOLN
2.0000 g | Freq: Once | INTRAMUSCULAR | Status: DC
  Filled 2017-06-17: qty 4

## 2017-06-17 MED ORDER — PIPERACILLIN-TAZOBACTAM 3.375 G IVPB 30 MIN
3.3750 g | Freq: Once | INTRAVENOUS | Status: AC
  Administered 2017-06-17: 3.375 g via INTRAVENOUS
  Filled 2017-06-17: qty 50

## 2017-06-17 MED ORDER — VANCOMYCIN HCL 10 G IV SOLR
1250.0000 mg | Freq: Once | INTRAVENOUS | Status: AC
  Administered 2017-06-17: 1250 mg via INTRAVENOUS
  Filled 2017-06-17: qty 1250

## 2017-06-17 MED ORDER — FENTANYL CITRATE (PF) 100 MCG/2ML IJ SOLN
50.0000 ug | INTRAMUSCULAR | Status: DC | PRN
  Administered 2017-06-17 – 2017-06-18 (×2): 50 ug via INTRAVENOUS
  Filled 2017-06-17 (×2): qty 2

## 2017-06-17 MED ORDER — ADULT MULTIVITAMIN W/MINERALS CH: 1.0000 | ORAL_TABLET | Freq: Once | ORAL | Status: DC

## 2017-06-17 NOTE — ED Notes (Signed)
Pt returned from imaging.

## 2017-06-17 NOTE — ED Provider Notes (Signed)
[name] EMERGENCY DEPARTMENT Provider Note   CSN: [id number] Arrival date & time: 06/17/17  1446     History   Chief Complaint Chief Complaint  Patient presents with  . Nausea    HPI Mark [name] is a 62 y.o. male.  HPI   61yM with generalized weakness and n/v since Sunday. Increasing R leg pain and swelling. R leg chronically swollen but worse in the past several days. Denies acute trauma. Chills. Some crampy abdominal pain. Anorexia. Decreased urinary output. No dysuria.   Past Medical History:  Diagnosis Date  . Alcohol abuse   . Anemia   . Arthritis   . Ascites   . Cirrhosis ([place])   . Heart murmur   . Hyperlipidemia   . Hypertension   . Leg swelling   . Myocardial infarction ([place]) 2012  . Stroke [facility]) 2012   no deficits  . Thrombocytopenia [facility])     Patient Active Problem List   Diagnosis Date Noted  . Dehydration 06/17/2017  . Hypotension 06/17/2017  . Acute on chronic renal failure ([place]) 06/17/2017  . Hyponatremia 06/17/2017  . Thrombocytopenia ([place]) 06/17/2017  . Sepsis ([place]) 06/17/2017  . Leg edema, right 06/17/2017  . Anemia, iron deficiency   . Benign neoplasm of ascending colon   . Hemorrhoids   . Portal hypertensive gastropathy ([place])   . Gastritis and gastroduodenitis   . Esophageal varices without bleeding ([place])   . H/O: CVA (cerebrovascular accident) 12/01/2013  . ACS (acute coronary syndrome) ([place]) 12/01/2013  . Anasarca 12/01/2013  . Substance abuse ([place]) 12/01/2013  . CKD (chronic kidney disease) stage 3, GFR 30-59 ml/min ([name]) 12/01/2013  . S/P right TKA 05/02/2013  . Preop cardiovascular exam 04/14/2013  . Murmur 04/14/2013  . Right inguinal hernia 12/26/2010  . Cirrhosis, alcoholic ([place]) 34/74/2595    Past Surgical History:  Procedure Laterality Date  . COLONOSCOPY WITH PROPOFOL N/A 02/07/2016   Procedure: COLONOSCOPY WITH PROPOFOL;  Surgeon: Milus Banister, MD;  Location: [facility] ENDOSCOPY;  Service:  Endoscopy;  Laterality: N/A;  . ESOPHAGOGASTRODUODENOSCOPY (EGD) WITH PROPOFOL N/A 02/07/2016   Procedure: ESOPHAGOGASTRODUODENOSCOPY (EGD) WITH PROPOFOL;  Surgeon: Milus Banister, MD;  Location: [facility] ENDOSCOPY;  Service: Endoscopy;  Laterality: N/A;  . HERNIA REPAIR Right    inguinal  . KNEE ARTHROSCOPY     bilateral/  12/14  . TOTAL KNEE ARTHROPLASTY Right 05/02/2013   Procedure: RIGHT TOTAL KNEE ARTHROPLASTY;  Surgeon: Mauri Pole, MD;  Location: [facility] ORS;  Service: Orthopedics;  Laterality: Right;       Home Medications    Prior to Admission medications   Medication Sig Start Date End Date Taking? Authorizing Provider  furosemide (LASIX) 20 MG tablet Take 1 tablet (20 mg total) by mouth 2 (two) times daily. 01/19/17  Yes Levin Erp, PA  nadolol (CORGARD) 20 MG tablet Take 1 tablet (20 mg total) by mouth daily. 01/19/17  Yes Levin Erp, PA  spironolactone (ALDACTONE) 50 MG tablet Take 2 tablets (100 mg total) by mouth 2 (two) times daily. 01/19/17  Yes Levin Erp, PA    Family History Family History  Problem Relation Age of Onset  . Hypertension Father   . Diabetes Father   . Dementia Mother   . Lupus Sister     Social History Social History   Tobacco Use  . Smoking status: Former Smoker    Packs/day: 0.50    Years: 10.00    Pack years: 5.00  Types: Cigarettes    Last attempt to quit: 01/05/2016    Years since quitting: 1.4  . Smokeless tobacco: Never Used  . Tobacco comment: 4 cigarettes a day  Substance Use Topics  . Alcohol use: No    Comment: 40 oz per week as of 01-19-17  . Drug use: Yes    Types: Marijuana, Heroin    Comment: took about 1 month ago- as of 01-19-17     Allergies   Patient has no known allergies.   Review of Systems Review of Systems  All systems reviewed and negative, other than as noted in HPI.  Physical Exam Updated Vital Signs BP 94/69   Pulse 81   Temp (!) 94 F (34.4 C) (Oral)   Resp 13    SpO2 100%   Physical Exam  Constitutional: He appears well-developed and well-nourished. No distress.  Drowsy but not toxic.   HENT:  Head: Normocephalic and atraumatic.  Eyes: Conjunctivae are normal. Right eye exhibits no discharge. Left eye exhibits no discharge.  Neck: Neck supple.  Cardiovascular: Normal rate, regular rhythm and normal heart sounds. Exam reveals no gallop and no friction rub.  No murmur heard. Pulmonary/Chest: Effort normal and breath sounds normal. No respiratory distress.  Abdominal: Soft. He exhibits no distension. There is no tenderness.  Musculoskeletal: He exhibits edema. He exhibits no tenderness.  Assymmetric RLE edema. Pitting. Pain with ROM. TTP diffusely. No discrete ares of more focal tenderness. No concerning skin lesions.   Neurological: He is alert.  Skin: Skin is warm and dry.  Psychiatric: He has a normal mood and affect. His behavior is normal. Thought content normal.  Nursing note and vitals reviewed.    ED Treatments / Results  Labs (all labs ordered are listed, but only abnormal results are displayed) Labs Reviewed  BLOOD CULTURE ID PANEL (REFLEXED) - Abnormal; Notable for the following components:      Result Value   Streptococcus species DETECTED (*)    Streptococcus pyogenes DETECTED (*)    All other components within normal limits  COMPREHENSIVE METABOLIC PANEL - Abnormal; Notable for the following components:   Sodium 122 (*)    Potassium 5.2 (*)    Chloride 93 (*)    CO2 17 (*)    Glucose, Bld 55 (*)    BUN 76 (*)    Creatinine, Ser 6.48 (*)    Calcium 7.5 (*)    Total Protein 6.3 (*)    Albumin 2.3 (*)    AST 72 (*)    Total Bilirubin 2.1 (*)    GFR calc non Af Amer 8 (*)    GFR calc Af Amer 9 (*)    All other components within normal limits  CBC - Abnormal; Notable for the following components:   RBC 3.09 (*)    Hemoglobin 10.4 (*)    HCT 28.9 (*)    Platelets 63 (*)    All other components within normal limits    URINALYSIS, ROUTINE W REFLEX MICROSCOPIC - Abnormal; Notable for the following components:   Color, Urine AMBER (*)    APPearance HAZY (*)    Hgb urine dipstick SMALL (*)    Leukocytes, UA TRACE (*)    Bacteria, UA RARE (*)    Squamous Epithelial / LPF 0-5 (*)    Non Squamous Epithelial 0-5 (*)    All other components within normal limits  MAGNESIUM - Abnormal; Notable for the following components:   Magnesium 1.1 (*)  All other components within normal limits  PHOSPHORUS - Abnormal; Notable for the following components:   Phosphorus 6.1 (*)    All other components within normal limits  PROTIME-INR - Abnormal; Notable for the following components:   Prothrombin Time 16.8 (*)    All other components within normal limits  OSMOLALITY, URINE - Abnormal; Notable for the following components:   Osmolality, Ur 218 (*)    All other components within normal limits  RAPID URINE DRUG SCREEN, HOSP PERFORMED - Abnormal; Notable for the following components:   Opiates POSITIVE (*)    All other components within normal limits  AMMONIA - Abnormal; Notable for the following components:   Ammonia 114 (*)    All other components within normal limits  MAGNESIUM - Abnormal; Notable for the following components:   Magnesium 1.5 (*)    All other components within normal limits  PHOSPHORUS - Abnormal; Notable for the following components:   Phosphorus 5.2 (*)    All other components within normal limits  COMPREHENSIVE METABOLIC PANEL - Abnormal; Notable for the following components:   Sodium 130 (*)    CO2 12 (*)    BUN 67 (*)    Creatinine, Ser 4.50 (*)    Calcium 6.2 (*)    Total Protein 4.8 (*)    Albumin 1.8 (*)    AST 69 (*)    Total Bilirubin 2.2 (*)    GFR calc non Af Amer 13 (*)    GFR calc Af Amer 15 (*)    All other components within normal limits  CBC - Abnormal; Notable for the following components:   RBC 2.58 (*)    Hemoglobin 8.5 (*)    HCT 23.7 (*)    Platelets 43 (*)     All other components within normal limits  LACTIC ACID, PLASMA - Abnormal; Notable for the following components:   Lactic Acid, Venous 2.7 (*)    All other components within normal limits  LACTIC ACID, PLASMA - Abnormal; Notable for the following components:   Lactic Acid, Venous 2.6 (*)    All other components within normal limits  I-STAT CG4 LACTIC ACID, ED - Abnormal; Notable for the following components:   Lactic Acid, Venous 2.01 (*)    All other components within normal limits  CBG MONITORING, ED - Abnormal; Notable for the following components:   Glucose-Capillary <10 (*)    All other components within normal limits  CBG MONITORING, ED - Abnormal; Notable for the following components:   Glucose-Capillary 135 (*)    All other components within normal limits  CBG MONITORING, ED - Abnormal; Notable for the following components:   Glucose-Capillary 126 (*)    All other components within normal limits  CULTURE, BLOOD (ROUTINE X 2)  CULTURE, BLOOD (ROUTINE X 2)  GASTROINTESTINAL PANEL BY PCR, STOOL (REPLACES STOOL CULTURE)  URINE CULTURE  MRSA PCR SCREENING  LIPASE, BLOOD  CK  CREATININE, URINE, RANDOM  SODIUM, URINE, RANDOM  INFLUENZA PANEL BY PCR (TYPE A & B)  HIV ANTIBODY (ROUTINE TESTING)  TSH  PROCALCITONIN  BASIC METABOLIC PANEL  CBC  I-STAT TROPONIN, ED  I-STAT CG4 LACTIC ACID, ED  CBG MONITORING, ED  CBG MONITORING, ED  CBG MONITORING, ED  CBG MONITORING, ED  CBG MONITORING, ED    EKG  EKG Interpretation  Date/Time:  Wednesday June 17 2017 17:56:22 EST Ventricular Rate:  82 PR Interval:    QRS Duration: 105 QT Interval:  412 QTC Calculation: 482  R Axis:   34 Text Interpretation:  Sinus rhythm Borderline prolonged QT interval No significant change since last tracing Confirmed by Thayer Jew [phone]) on 06/18/2017 11:12:09 AM       Radiology Ct Abdomen Pelvis Wo Contrast  Result Date: 06/17/2017 CLINICAL DATA:  Abdominal pain, nausea and  vomiting, and diarrhea for 4 days. Alcoholic cirrhosis. Chronic kidney disease stage 3. EXAM: CT ABDOMEN AND PELVIS WITHOUT CONTRAST TECHNIQUE: Multidetector CT imaging of the abdomen and pelvis was performed following the standard protocol without IV contrast. COMPARISON:  Ultrasound on 01/23/2017 FINDINGS: Lower chest: No acute findings. Hepatobiliary: No mass visualized on this unenhanced exam. Hepatic cirrhosis demonstrated. Multiple tiny gallstones are seen. Gallbladder is nondilated and mild diffuse gallbladder wall thickening appears stable compared to previous ultrasound. No evidence of acute pericholecystic inflammatory changes or fluid collections. No evidence of biliary ductal dilatation. Pancreas: No mass or inflammatory process visualized on this unenhanced exam. Spleen:  Within normal limits in size. Adrenals/Urinary tract: No evidence of urolithiasis or hydronephrosis. Small fluid attenuation cyst noted in lower pole of left kidney. Unremarkable unopacified urinary bladder. Stomach/Bowel: No evidence of obstruction, inflammatory process, or abnormal fluid collections. Normal appendix visualized. Vascular/Lymphatic: No pathologically enlarged lymph nodes identified. No evidence of abdominal aortic aneurysm. Aortic atherosclerosis. Esophageal varices, consistent with portal venous hypertension. Mild mesenteric and retroperitoneal edema, without evidence of ascites, also likely secondary to cirrhosis. Reproductive:  No mass or other significant abnormality. Other:  None. Musculoskeletal:  No suspicious bone lesions identified. IMPRESSION: Hepatic cirrhosis, and findings of portal venous hypertension including esophageal varices. Mild mesenteric and retroperitoneal edema, likely secondary to cirrhosis/portal venous hypertension. No evidence of ascites or focal inflammatory process. Cholelithiasis.  No radiographic evidence of acute cholecystitis. Electronically Signed   By: Earle Gell M.D.   On:  06/17/2017 18:06    Procedures Procedures (including critical care time)  Medications Ordered in ED Medications  fentaNYL (SUBLIMAZE) injection 50 mcg (50 mcg Intravenous Given 06/17/17 1826)  thiamine (VITAMIN B-1) tablet 100 mg (not administered)  multivitamin with minerals tablet 1 tablet (not administered)  sodium chloride 0.9 % bolus 2,000 mL (0 mLs Intravenous Stopped 06/17/17 1726)  vancomycin (VANCOCIN) 1,250 mg in sodium chloride 0.9 % 250 mL IVPB (0 mg Intravenous Stopped 06/17/17 1854)  piperacillin-tazobactam (ZOSYN) IVPB 3.375 g (0 g Intravenous Stopped 06/17/17 1753)  sodium chloride 0.9 % bolus 1,000 mL (0 mLs Intravenous Stopped 06/17/17 1842)     Initial Impression / Assessment and Plan / ED Course  I have reviewed the triage vital signs and the nursing notes.  Pertinent labs & imaging results that were available during my care of the patient were reviewed by me and considered in my medical decision making (see chart for details).     AKI. Hydration. Empiric abx for possible sepsis although no clear source. Doppler for assymmetric RLE swelling. Admit.   Final Clinical Impressions(s) / ED Diagnoses   Final diagnoses:  Sepsis [facility])  Renal failure    ED Discharge Orders    None       Virgel Manifold, MD 06/18/17 2025

## 2017-06-17 NOTE — ED Notes (Signed)
Internal medicine at bedside

## 2017-06-17 NOTE — ED Notes (Signed)
Patient's wife reports that she feels that her husband "may be withdrawing" when asked from what his daughter states that he drinks "all day". When asked how much they said he "sips on beer all day" Wife also states that "he snorts heroin"

## 2017-06-17 NOTE — ED Notes (Signed)
Patient transported to X-ray 

## 2017-06-17 NOTE — H&P (Signed)
Mark [name] XIP:[id number] DOB: September 30, 1955 DOA: 06/17/2017     PCP: [name], MD   Outpatient Specialists: Elwyn Reach Patient coming from:   home Lives   With family    Chief Complaint: Nausea vomiting for the past 3 days   HPI: Mark [name] is a 62 y.o. male with medical history significant of alcoholic cirrhosis, alcohol abuse, history of varices, substance abuse , chronic anemia , H M.D., HTN, history of CAD, s MI, thrombocytopenia  Presented with generalized fatigue nausea and vomiting since the weekend. His right leg was swelling more than usual . Endorses flulike symptoms initiated a hypotensive injury with temperature orally 94  Reports significant pain in right lower extremity with increased swelling.  No sick contacts.  Reports no EtOH for the pst 2 days. Family endorses some shaking.     Family endorses that he has recurrent right leg swelling especially he forgets to take his Lasix he had had numerous surgeries on the right leg. Family denies patient being confused but appear ER report he was noted to be confused on arrival.  Family endorses to me the patient continues to snort heroin.   While in ER:  he was noted to be significantly hypotensive with blood pressure.to 70/5 required 3  L normal saline to improve Sepsis suspected He was started on vancomycin and Zosyn.  Significant initial  Findings:  Patient was noted to have acute renal failure with creatinine up to  6 from baseline of 1.15  At bedtime noted to drop to 63 from baseline of 120s  Lactic Acid: 2.01>1.88 Na 122 K 5.2  Alb 2.3 Hg 10.4 stable WBC 4.7  CT abd: cirrhosis and portal venous hypertension no acute  ER provider discussed case with: Nephrology Dr. Augustin Coupe Who recommends: Continue IV fluid rehydration We'll see patient in consult in the morning     IN ER:  Temp (24hrs), Avg:94 F (34.4 C), Min:94 F (34.4 C), Max:94 F (34.4 C)      on arrival  ED Triage  Vitals  Enc Vitals Group     BP 06/17/17 1523 (!) 70/45     Pulse Rate 06/17/17 1523 74     Resp 06/17/17 1523 16     Temp 06/17/17 1523 (!) 94 F (34.4 C)     Temp Source 06/17/17 1523 Oral     SpO2 06/17/17 1523 100 %     Weight --      Height --      Head Circumference --      Peak Flow --      Pain Score 06/17/17 1524 10     Pain Loc --      Pain Edu? --      Excl. in [place]? --     Latest  RR 13 sat 100% HR 8- BP 94/69  Following Medications were ordered in ER: Medications  fentaNYL (SUBLIMAZE) injection 50 mcg (50 mcg Intravenous Given 06/17/17 1826)  thiamine (VITAMIN B-1) tablet 100 mg (not administered)  multivitamin with minerals tablet 1 tablet (not administered)  sodium chloride 0.9 % bolus 2,000 mL (0 mLs Intravenous Stopped 06/17/17 1726)  vancomycin (VANCOCIN) 1,250 mg in sodium chloride 0.9 % 250 mL IVPB (1,250 mg Intravenous New Bag/Given 06/17/17 1724)  piperacillin-tazobactam (ZOSYN) IVPB 3.375 g (0 g Intravenous Stopped 06/17/17 1753)  sodium chloride 0.9 % bolus 1,000 mL (0 mLs Intravenous Stopped 06/17/17 1842)      Hospitalist was called for admission for  severe dehydration and sepsis in the setting of recent nausea vomiting diarrhea resulting in acute on chronic kidney failure  Regarding pertinent Chronic problems: Cirrhosis myositis secondary to alcohol abuse in the past patient has had workup for hepatitis. Last EGD did show varices which she was treated Nadolol Patient continues to drink heavily, and continues to abuse substances including snorting heroin   Review of Systems:    Pertinent positives include: nausea, vomiting, diarrhea,   Constitutional:  No weight loss, night sweats, Fevers, chills, fatigue, weight loss  HEENT:  No headaches, Difficulty swallowing,Tooth/dental problems,Sore throat,  No sneezing, itching, ear ache, nasal congestion, post nasal drip,  Cardio-vascular:  No chest pain, Orthopnea, PND, anasarca, dizziness, palpitations.no  Bilateral lower extremity swelling  GI:  No heartburn, indigestion, abdominal pain, change in bowel habits, loss of appetite, melena, blood in stool, hematemesis Resp:  no shortness of breath at rest. No dyspnea on exertion, No excess mucus, no productive cough, No non-productive cough, No coughing up of blood.No change in color of mucus.No wheezing. Skin:  no rash or lesions. No jaundice GU:  no dysuria, change in color of urine, no urgency or frequency. No straining to urinate.  No flank pain.  Musculoskeletal:  No joint pain or no joint swelling. No decreased range of motion. No back pain.  Psych:  No change in mood or affect. No depression or anxiety. No memory loss.  Neuro: no localizing neurological complaints, no tingling, no weakness, no double vision, no gait abnormality, no slurred speech, no confusion  As per HPI otherwise 10 point review of systems negative.   Past Medical History: Past Medical History:  Diagnosis Date  . Alcohol abuse   . Anemia   . Arthritis   . Ascites   . Cirrhosis ([place])   . Heart murmur   . Hyperlipidemia   . Hypertension   . Leg swelling   . Myocardial infarction ([place]) 2012  . Stroke [facility]) 2012   no deficits  . Thrombocytopenia ([place])    Past Surgical History:  Procedure Laterality Date  . COLONOSCOPY WITH PROPOFOL N/A 02/07/2016   Procedure: COLONOSCOPY WITH PROPOFOL;  Surgeon: Milus Banister, MD;  Location: [facility] ENDOSCOPY;  Service: Endoscopy;  Laterality: N/A;  . ESOPHAGOGASTRODUODENOSCOPY (EGD) WITH PROPOFOL N/A 02/07/2016   Procedure: ESOPHAGOGASTRODUODENOSCOPY (EGD) WITH PROPOFOL;  Surgeon: Milus Banister, MD;  Location: [facility] ENDOSCOPY;  Service: Endoscopy;  Laterality: N/A;  . HERNIA REPAIR Right    inguinal  . KNEE ARTHROSCOPY     bilateral/  12/14  . TOTAL KNEE ARTHROPLASTY Right 05/02/2013   Procedure: RIGHT TOTAL KNEE ARTHROPLASTY;  Surgeon: Mauri Pole, MD;  Location: [facility] ORS;  Service: Orthopedics;  Laterality: Right;      Social History:  Ambulatory independently     reports that he quit smoking about 17 months ago. His smoking use included cigarettes. He has a 5.00 pack-year smoking history. he has never used smokeless tobacco. He reports that he uses drugs. Drugs: Marijuana and Heroin. He reports that he does not drink alcohol.  Allergies:  No Known Allergies     Family History:   Family History  Problem Relation Age of Onset  . Hypertension Father   . Diabetes Father   . Dementia Mother   . Lupus Sister     Medications: Prior to Admission medications   Medication Sig Start Date End Date Taking? Authorizing Provider  furosemide (LASIX) 20 MG tablet Take 1 tablet (20 mg total) by mouth 2 (two) times  daily. 01/19/17  Yes Levin Erp, PA  nadolol (CORGARD) 20 MG tablet Take 1 tablet (20 mg total) by mouth daily. 01/19/17  Yes Levin Erp, PA  spironolactone (ALDACTONE) 50 MG tablet Take 2 tablets (100 mg total) by mouth 2 (two) times daily. 01/19/17  Yes Levin Erp, PA    Physical Exam: Patient Vitals for the past 24 hrs:  BP Temp Temp src Pulse Resp SpO2  06/17/17 1930 97/68 - - 79 12 100 %  06/17/17 1845 92/62 - - 79 15 99 %  06/17/17 1815 99/65 - - 81 14 100 %  06/17/17 1745 (!) 86/73 - - 81 15 98 %  06/17/17 1715 (!) 87/59 - - 79 10 100 %  06/17/17 1630 92/66 - - - - -  06/17/17 1615 (!) 89/61 - - - - -  06/17/17 1600 (!) 88/57 - - 70 - 98 %  06/17/17 1523 (!) 70/45 (!) 94 F (34.4 C) Oral 74 16 100 %    1. General:  in No Acute distress   Chronically ill -appearing 2. Psychological: Alert but not Oriented 3. Head/ENT:   Dry Mucous Membranes                          Head Non traumatic, neck supple                          Poor Dentition 4. SKIN:   decreased Skin turgor,  Skin clean Dry and intact no rash 5. Heart: Regular rate and rhythm no  Murmur, no Rub or gallop 6. Lungs:  no wheezes or crackles   7. Abdomen: Soft,  non-tender,   distended   obese  bowel sounds present 8. Lower extremities: no clubbing, cyanosis, or edema 9. Neurologically Grossly intact, moving all 4 extremities equally   10. MSK: Normal range of motion   body mass index is unknown because there is no height or weight on file.  Labs on Admission:   Labs on Admission: I have personally reviewed following labs and imaging studies  CBC: Recent Labs  Lab 06/17/17 1525  WBC 4.7  HGB 10.4*  HCT 28.9*  MCV 93.5  PLT 63*   Basic Metabolic Panel: Recent Labs  Lab 06/17/17 1525  NA 122*  K 5.2*  CL 93*  CO2 17*  GLUCOSE 55*  BUN 76*  CREATININE 6.48*  CALCIUM 7.5*   GFR: CrCl cannot be calculated (Unknown ideal weight.). Liver Function Tests: Recent Labs  Lab 06/17/17 1525  AST 72*  ALT 24  ALKPHOS 63  BILITOT 2.1*  PROT 6.3*  ALBUMIN 2.3*   Recent Labs  Lab 06/17/17 1525  LIPASE 23   No results for input(s): AMMONIA in the last 168 hours. Coagulation Profile: No results for input(s): INR, PROTIME in the last 168 hours. Cardiac Enzymes: No results for input(s): CKTOTAL, CKMB, CKMBINDEX, TROPONINI in the last 168 hours. BNP (last 3 results) No results for input(s): PROBNP in the last 8760 hours. HbA1C: No results for input(s): HGBA1C in the last 72 hours. CBG: No results for input(s): GLUCAP in the last 168 hours. Lipid Profile: No results for input(s): CHOL, HDL, LDLCALC, TRIG, CHOLHDL, LDLDIRECT in the last 72 hours. Thyroid Function Tests: No results for input(s): TSH, T4TOTAL, FREET4, T3FREE, THYROIDAB in the last 72 hours. Anemia Panel: No results for input(s): VITAMINB12, FOLATE, FERRITIN, TIBC, IRON, RETICCTPCT in the last 72  hours. Urine analysis:    Component Value Date/Time   COLORURINE AMBER (A) 06/17/2017 1649   APPEARANCEUR HAZY (A) 06/17/2017 1649   LABSPEC 1.013 06/17/2017 1649   PHURINE 5.0 06/17/2017 1649   GLUCOSEU NEGATIVE 06/17/2017 1649   HGBUR SMALL (A) 06/17/2017 1649   BILIRUBINUR  NEGATIVE 06/17/2017 1649   KETONESUR NEGATIVE 06/17/2017 1649   PROTEINUR NEGATIVE 06/17/2017 1649   UROBILINOGEN 2.0 (H) 04/26/2013 0945   NITRITE NEGATIVE 06/17/2017 1649   LEUKOCYTESUR TRACE (A) 06/17/2017 1649   Sepsis Labs: @LABRCNTIP (procalcitonin:4,lacticidven:4) )No results found for this or any previous visit (from the past 240 hour(s)).     UA mild leukocytosis in the urine, rare bacteria  Lab Results  Component Value Date   HGBA1C  11/01/2009    5.1 (NOTE)                                                                       According to the ADA Clinical Practice Recommendations for 2011, when HbA1c is used as a screening test:   >=6.5%   Diagnostic of Diabetes Mellitus           (if abnormal result  is confirmed)  5.7-6.4%   Increased risk of developing Diabetes Mellitus  References:Diagnosis and Classification of Diabetes Mellitus,Diabetes EUMP,5361,44(RXVQM 1):S62-S69 and Standards of Medical Care in         Diabetes - 2011,Diabetes GQQP,6195,09  (Suppl 1):S11-S61.    CrCl cannot be calculated (Unknown ideal weight.).  BNP (last 3 results) No results for input(s): PROBNP in the last 8760 hours.   ECG REPORT  Independently reviewed Rate:82  Rhythm: NSR ST&T Change: No acute ischemic changes   QTC 482  There were no vitals filed for this visit.   Cultures: No results found for: SDES, SPECREQUEST, CULT, REPTSTATUS   Radiological Exams on Admission: Ct Abdomen Pelvis Wo Contrast  Result Date: 06/17/2017 CLINICAL DATA:  Abdominal pain, nausea and vomiting, and diarrhea for 4 days. Alcoholic cirrhosis. Chronic kidney disease stage 3. EXAM: CT ABDOMEN AND PELVIS WITHOUT CONTRAST TECHNIQUE: Multidetector CT imaging of the abdomen and pelvis was performed following the standard protocol without IV contrast. COMPARISON:  Ultrasound on 01/23/2017 FINDINGS: Lower chest: No acute findings. Hepatobiliary: No mass visualized on this unenhanced exam. Hepatic cirrhosis  demonstrated. Multiple tiny gallstones are seen. Gallbladder is nondilated and mild diffuse gallbladder wall thickening appears stable compared to previous ultrasound. No evidence of acute pericholecystic inflammatory changes or fluid collections. No evidence of biliary ductal dilatation. Pancreas: No mass or inflammatory process visualized on this unenhanced exam. Spleen:  Within normal limits in size. Adrenals/Urinary tract: No evidence of urolithiasis or hydronephrosis. Small fluid attenuation cyst noted in lower pole of left kidney. Unremarkable unopacified urinary bladder. Stomach/Bowel: No evidence of obstruction, inflammatory process, or abnormal fluid collections. Normal appendix visualized. Vascular/Lymphatic: No pathologically enlarged lymph nodes identified. No evidence of abdominal aortic aneurysm. Aortic atherosclerosis. Esophageal varices, consistent with portal venous hypertension. Mild mesenteric and retroperitoneal edema, without evidence of ascites, also likely secondary to cirrhosis. Reproductive:  No mass or other significant abnormality. Other:  None. Musculoskeletal:  No suspicious bone lesions identified. IMPRESSION: Hepatic cirrhosis, and findings of portal venous hypertension including esophageal varices. Mild mesenteric and retroperitoneal edema, likely secondary  to cirrhosis/portal venous hypertension. No evidence of ascites or focal inflammatory process. Cholelithiasis.  No radiographic evidence of acute cholecystitis. Electronically Signed   By: Earle Gell M.D.   On: 06/17/2017 18:06    Chart has been reviewed    Assessment/Plan 62 y.o. male with medical history significant of alcoholic cirrhosis, alcohol abuse, history of varices, substance abuse , chronic anemia , H M.D., HTN, history of CAD, sp MI, thrombocytopenia Admitted for severe dehydration and sepsis in the setting of recent nausea vomiting diarrhea resulting in acute on chronic kidney failure   Present on  Admission: . Acute on chronic renal failure ([name]) most likely secondary to dehydration but cannot rule out hepatorenal syndrome. We'll obtain urine electrolytes appreciate nephrology consult will order renal ultrasound . Dehydration like to secondary to recent gastrointestinal illness. Will rehydrate . Hypotension - multifactorial likely secondary to dehydration as well as cirrhosis. Hold home medications for now rehydrate. . Anemia, iron deficiency chronic currently at baseline . Cirrhosis, alcoholic ([place]) - ongoing secondary to continuous alcohol abuse patient has been followed by GI. Overall poor prognosis which was discussed with family  . Hyponatremia likely multifactorial in the setting of cirrhosis but also dehydration will order urine electrolytes rehydrate and monitor . Thrombocytopenia ([place]) chronic secondary to cirrhosis currently worsening hold Lovenox . Sepsis ([place]) source unclear CT of abdomen unremarkable urine culture ordered . Leg edema, right - recurrent Dopplers ordered awaiting results. . Acute metabolic encephalopathy - will order ammonia level most likely as a result of infectious process uremia  Will evaluate for hepatic encephalopathy treated accordingly Hypoalbuminemia will administer albumin to help her fluid status Other plan as per orders.  Alcohol abuse - CIWA ordered, possibly early withdrawal given tremors currently no evidence of tachycardia or hypertension  DVT prophylaxis:  SCD       Code Status:  FULL CODE  as per  family   Family Communication:   Family   at  Bedside  plan of care was discussed with  Daughter, Wife,  Disposition Plan:     To home once workup is complete and patient is stable                           Social Work    consulted                          Consults called: nephrology    Admission status:   inpatient      Level of care       SDU      I have spent a total of 56 min on this admission   Eusebia Grulke 06/17/2017,  10:04 PM    [facility]  Pager [phone]   after 2 AM please page floor coverage PA If 7AM-7PM, please contact the day team taking care of the patient  [email or web address]  Password [email or web address]

## 2017-06-17 NOTE — ED Notes (Signed)
Patient transported to CT 

## 2017-06-17 NOTE — ED Notes (Signed)
Pt transported to Ultrasound.  

## 2017-06-17 NOTE — ED Triage Notes (Signed)
Pt presents to ED for assessment for nausea and vomiting this weekend, being too weak to get out of bed intermittently x 3 days, generalized weakness, right leg swelling and pain, and flu-like symptoms.  Hypotensive in triage, temp 94 orally.

## 2017-06-18 ENCOUNTER — Other Ambulatory Visit: Payer: Self-pay

## 2017-06-18 ENCOUNTER — Inpatient Hospital Stay (HOSPITAL_COMMUNITY)
Admit: 2017-06-18 | Discharge: 2017-06-18 | Disposition: A | Discharge status: Home / Self Care | Payer: BC Managed Care – PPO | Attending: Internal Medicine | Admitting: Internal Medicine

## 2017-06-18 DIAGNOSIS — N179 Acute kidney failure, unspecified: Secondary | ICD-10-CM

## 2017-06-18 DIAGNOSIS — R6521 Severe sepsis with septic shock: Secondary | ICD-10-CM

## 2017-06-18 DIAGNOSIS — K7030 Alcoholic cirrhosis of liver without ascites: Secondary | ICD-10-CM

## 2017-06-18 DIAGNOSIS — A419 Sepsis, unspecified organism: Secondary | ICD-10-CM | POA: Diagnosis present

## 2017-06-18 DIAGNOSIS — M7989 Other specified soft tissue disorders: Secondary | ICD-10-CM

## 2017-06-18 DIAGNOSIS — N182 Chronic kidney disease, stage 2 (mild): Secondary | ICD-10-CM

## 2017-06-18 HISTORY — DX: Sepsis, unspecified organism: A41.9

## 2017-06-18 LAB — COMPREHENSIVE METABOLIC PANEL
ALT: 21 U/L (ref 17–63)
AST: 69 U/L — ABNORMAL HIGH (ref 15–41)
Albumin: 1.8 g/dL — ABNORMAL LOW (ref 3.5–5.0)
Alkaline Phosphatase: 45 U/L (ref 38–126)
Anion gap: 12 (ref 5–15)
BUN: 67 mg/dL — ABNORMAL HIGH (ref 6–20)
CO2: 12 mmol/L — ABNORMAL LOW (ref 22–32)
Calcium: 6.2 mg/dL — CL (ref 8.9–10.3)
Chloride: 106 mmol/L (ref 101–111)
Creatinine, Ser: 4.5 mg/dL — ABNORMAL HIGH (ref 0.61–1.24)
GFR calc Af Amer: 15 mL/min — ABNORMAL LOW (ref >60)
GFR calc non Af Amer: 13 mL/min — ABNORMAL LOW (ref >60)
Glucose, Bld: 81 mg/dL (ref 65–99)
Potassium: 3.6 mmol/L (ref 3.5–5.1)
Sodium: 130 mmol/L — ABNORMAL LOW (ref 135–145)
Total Bilirubin: 2.2 mg/dL — ABNORMAL HIGH (ref 0.3–1.2)
Total Protein: 4.8 g/dL — ABNORMAL LOW (ref 6.5–8.1)

## 2017-06-18 LAB — INFLUENZA PANEL BY PCR (TYPE A & B)
Influenza A By PCR: NEGATIVE
Influenza B By PCR: NEGATIVE

## 2017-06-18 LAB — CBC
HCT: 23.7 % — ABNORMAL LOW (ref 39.0–52.0)
Hemoglobin: 8.5 g/dL — ABNORMAL LOW (ref 13.0–17.0)
MCH: 32.9 pg (ref 26.0–34.0)
MCHC: 35.9 g/dL (ref 30.0–36.0)
MCV: 91.9 fL (ref 78.0–100.0)
Platelets: 43 10*3/uL — ABNORMAL LOW (ref 150–400)
RBC: 2.58 MIL/uL — ABNORMAL LOW (ref 4.22–5.81)
RDW: 12.7 % (ref 11.5–15.5)
WBC: 5.2 10*3/uL (ref 4.0–10.5)

## 2017-06-18 LAB — BLOOD CULTURE ID PANEL (REFLEXED)
Acinetobacter baumannii: NOT DETECTED
Candida albicans: NOT DETECTED
Candida glabrata: NOT DETECTED
Candida krusei: NOT DETECTED
Candida parapsilosis: NOT DETECTED
Candida tropicalis: NOT DETECTED
Enterobacter cloacae complex: NOT DETECTED
Enterobacteriaceae species: NOT DETECTED
Enterococcus species: NOT DETECTED
Escherichia coli: NOT DETECTED
Haemophilus influenzae: NOT DETECTED
Klebsiella oxytoca: NOT DETECTED
Klebsiella pneumoniae: NOT DETECTED
Listeria monocytogenes: NOT DETECTED
Neisseria meningitidis: NOT DETECTED
Proteus species: NOT DETECTED
Pseudomonas aeruginosa: NOT DETECTED
Serratia marcescens: NOT DETECTED
Staphylococcus aureus (BCID): NOT DETECTED
Staphylococcus species: NOT DETECTED
Streptococcus agalactiae: NOT DETECTED
Streptococcus pneumoniae: NOT DETECTED
Streptococcus pyogenes: DETECTED — AB
Streptococcus species: DETECTED — AB

## 2017-06-18 LAB — GLUCOSE, CAPILLARY
Glucose-Capillary: 126 mg/dL — ABNORMAL HIGH (ref 65–99)
Glucose-Capillary: 56 mg/dL — ABNORMAL LOW (ref 65–99)
Glucose-Capillary: 59 mg/dL — ABNORMAL LOW (ref 65–99)

## 2017-06-18 LAB — CBG MONITORING, ED
Glucose-Capillary: 135 mg/dL — ABNORMAL HIGH (ref 65–99)
Glucose-Capillary: <10 mg/dL — CL (ref 65–99)
Glucose-Capillary: 68 mg/dL (ref 65–99)
Glucose-Capillary: 126 mg/dL — ABNORMAL HIGH (ref 65–99)
Glucose-Capillary: 85 mg/dL (ref 65–99)
Glucose-Capillary: 86 mg/dL (ref 65–99)
Glucose-Capillary: 84 mg/dL (ref 65–99)
Glucose-Capillary: 68 mg/dL (ref 65–99)

## 2017-06-18 LAB — OSMOLALITY, URINE: Osmolality, Ur: 218 mOsm/kg — ABNORMAL LOW (ref 300–900)

## 2017-06-18 LAB — PHOSPHORUS: Phosphorus: 5.2 mg/dL — ABNORMAL HIGH (ref 2.5–4.6)

## 2017-06-18 LAB — CREATININE, URINE, RANDOM: Creatinine, Urine: 54.08 mg/dL

## 2017-06-18 LAB — HIV ANTIBODY (ROUTINE TESTING W REFLEX): HIV Screen 4th Generation wRfx: NONREACTIVE

## 2017-06-18 LAB — LACTIC ACID, PLASMA
Lactic Acid, Venous: 2.7 mmol/L (ref 0.5–1.9)
Lactic Acid, Venous: 2.6 mmol/L (ref 0.5–1.9)

## 2017-06-18 LAB — RAPID URINE DRUG SCREEN, HOSP PERFORMED
Amphetamines: NOT DETECTED
Barbiturates: NOT DETECTED
Benzodiazepines: NOT DETECTED
Cocaine: NOT DETECTED
Opiates: POSITIVE — AB
Tetrahydrocannabinol: NOT DETECTED

## 2017-06-18 LAB — PROCALCITONIN: Procalcitonin: 20.28 ng/mL

## 2017-06-18 LAB — SODIUM, URINE, RANDOM: Sodium, Ur: 29 mmol/L

## 2017-06-18 LAB — MAGNESIUM: Magnesium: 1.5 mg/dL — ABNORMAL LOW (ref 1.7–2.4)

## 2017-06-18 LAB — TSH: TSH: 1.98 u[IU]/mL (ref 0.350–4.500)

## 2017-06-18 MED ORDER — FOLIC ACID 5 MG/ML IJ SOLN
1.0000 mg | Freq: Every day | INTRAMUSCULAR | Status: DC
  Filled 2017-06-18 (×3): qty 0.2

## 2017-06-18 MED ORDER — ACETAMINOPHEN 650 MG RE SUPP: 650.0000 mg | Freq: Four times a day (QID) | RECTAL | Status: DC | PRN

## 2017-06-18 MED ORDER — SODIUM CHLORIDE 0.9 % IV BOLUS (SEPSIS)
500.0000 mL | Freq: Once | INTRAVENOUS | Status: AC
  Administered 2017-06-18: 500 mL via INTRAVENOUS

## 2017-06-18 MED ORDER — SODIUM CHLORIDE 0.9 % IV SOLN
INTRAVENOUS | Status: AC
  Administered 2017-06-18 (×2): via INTRAVENOUS

## 2017-06-18 MED ORDER — DEXTROSE 50 % IV SOLN
25.0000 mL | Freq: Once | INTRAVENOUS | Status: AC
  Administered 2017-06-18: 25 mL via INTRAVENOUS

## 2017-06-18 MED ORDER — LACTULOSE 10 GM/15ML PO SOLN
20.0000 g | Freq: Three times a day (TID) | ORAL | Status: DC
  Administered 2017-06-18 (×4): 20 g via ORAL
  Filled 2017-06-18 (×6): qty 30

## 2017-06-18 MED ORDER — VANCOMYCIN HCL IN DEXTROSE 1-5 GM/200ML-% IV SOLN: 1000.0000 mg | INTRAVENOUS | Status: DC

## 2017-06-18 MED ORDER — CLINDAMYCIN PHOSPHATE 900 MG/50ML IV SOLN
900.0000 mg | Freq: Three times a day (TID) | INTRAVENOUS | Status: DC
  Administered 2017-06-18 – 2017-06-20 (×7): 900 mg via INTRAVENOUS
  Filled 2017-06-18 (×10): qty 50

## 2017-06-18 MED ORDER — LORAZEPAM 2 MG/ML IJ SOLN
2.0000 mg | INTRAMUSCULAR | Status: DC | PRN
  Administered 2017-06-23: 2 mg via INTRAVENOUS
  Administered 2017-06-23: 1 mg via INTRAVENOUS
  Filled 2017-06-18 (×2): qty 1

## 2017-06-18 MED ORDER — DEXTROSE 50 % IV SOLN
50.0000 mL | Freq: Once | INTRAVENOUS | Status: AC
  Administered 2017-06-18: 50 mL via INTRAVENOUS

## 2017-06-18 MED ORDER — THIAMINE HCL 100 MG/ML IJ SOLN
100.0000 mg | Freq: Every day | INTRAMUSCULAR | Status: DC
  Administered 2017-06-18: 100 mg via INTRAVENOUS
  Filled 2017-06-18: qty 2
  Filled 2017-06-18: qty 1

## 2017-06-18 MED ORDER — DEXTROSE 50 % IV SOLN
INTRAVENOUS | Status: AC
  Administered 2017-06-18: 25 mL
  Filled 2017-06-18: qty 50

## 2017-06-18 MED ORDER — ACETAMINOPHEN 325 MG PO TABS
650.0000 mg | ORAL_TABLET | Freq: Four times a day (QID) | ORAL | Status: DC | PRN
  Filled 2017-06-18: qty 2

## 2017-06-18 MED ORDER — ONDANSETRON HCL 4 MG/2ML IJ SOLN
4.0000 mg | Freq: Four times a day (QID) | INTRAMUSCULAR | Status: DC | PRN
  Administered 2017-06-21: 4 mg via INTRAVENOUS
  Filled 2017-06-18: qty 2

## 2017-06-18 MED ORDER — PIPERACILLIN-TAZOBACTAM IN DEX 2-0.25 GM/50ML IV SOLN
2.2500 g | Freq: Three times a day (TID) | INTRAVENOUS | Status: DC
  Administered 2017-06-18: 2.25 g via INTRAVENOUS
  Filled 2017-06-18 (×2): qty 50

## 2017-06-18 MED ORDER — ONDANSETRON HCL 4 MG PO TABS: 4.0000 mg | ORAL_TABLET | Freq: Four times a day (QID) | ORAL | Status: DC | PRN

## 2017-06-18 MED ORDER — DEXTROSE-NACL 5-0.9 % IV SOLN
INTRAVENOUS | Status: DC
  Administered 2017-06-18 (×3): via INTRAVENOUS

## 2017-06-18 MED ORDER — PENICILLIN G POT IN DEXTROSE 60000 UNIT/ML IV SOLN
3.0000 10*6.[IU] | Freq: Three times a day (TID) | INTRAVENOUS | Status: DC
  Administered 2017-06-18 – 2017-06-19 (×3): 3 10*6.[IU] via INTRAVENOUS
  Filled 2017-06-18 (×7): qty 50

## 2017-06-18 NOTE — ED Notes (Signed)
Dr. Cruzita [place]ede[place]aged to [phone] Hayley-called by Levada Dy

## 2017-06-18 NOTE — ED Notes (Signed)
Heart Healthy Breakfast Tray Ordered @ 0746-per Hayley-called by Levada Dy

## 2017-06-18 NOTE — ED Notes (Signed)
Rectal temp checked.  Pt drowsy but easily   awakened

## 2017-06-18 NOTE — Progress Notes (Signed)
PHARMACY - PHYSICIAN COMMUNICATION CRITICAL VALUE ALERT - BLOOD CULTURE IDENTIFICATION (BCID)  Mark [name] is an 62 y.o. male who presented to [facility] on 06/17/2017 with a chief complaint of nausea and vomiting for 3 days  Assessment:  62 year old man to be admitted with sepsis, source unclear.  Patient with acute renal failure, so will need to adjust antibiotic doses accordingly.  Name of physician (or Provider) Contacted:Gherghe  Current antibiotics: vancomycin + piperacillin/tazobactam  Changes to prescribed antibiotics recommended: penicillin 3 million units q8h and clindamycin 900mg  IV q8h Recommendations accepted by provider  Results for orders placed or performed during the hospital encounter of 06/17/17  Blood Culture ID Panel (Reflexed) (Collected: 06/17/2017  5:26 PM)  Result Value Ref Range   Enterococcus species NOT DETECTED NOT DETECTED   Listeria monocytogenes NOT DETECTED NOT DETECTED   Staphylococcus species NOT DETECTED NOT DETECTED   Staphylococcus aureus NOT DETECTED NOT DETECTED   Streptococcus species DETECTED (A) NOT DETECTED   Streptococcus agalactiae NOT DETECTED NOT DETECTED   Streptococcus pneumoniae NOT DETECTED NOT DETECTED   Streptococcus pyogenes DETECTED (A) NOT DETECTED   Acinetobacter baumannii NOT DETECTED NOT DETECTED   Enterobacteriaceae species NOT DETECTED NOT DETECTED   Enterobacter cloacae complex NOT DETECTED NOT DETECTED   Escherichia coli NOT DETECTED NOT DETECTED   Klebsiella oxytoca NOT DETECTED NOT DETECTED   Klebsiella pneumoniae NOT DETECTED NOT DETECTED   Proteus species NOT DETECTED NOT DETECTED   Serratia marcescens NOT DETECTED NOT DETECTED   Haemophilus influenzae NOT DETECTED NOT DETECTED   Neisseria meningitidis NOT DETECTED NOT DETECTED   Pseudomonas aeruginosa NOT DETECTED NOT DETECTED   Candida albicans NOT DETECTED NOT DETECTED   Candida glabrata NOT DETECTED NOT DETECTED   Candida krusei NOT DETECTED NOT  DETECTED   Candida parapsilosis NOT DETECTED NOT DETECTED   Candida tropicalis NOT DETECTED NOT DETECTED    Candie Mile 06/18/2017  9:06 AM

## 2017-06-18 NOTE — Progress Notes (Signed)
PROGRESS NOTE  Early Steel HYQ:[id number] DOB: May 24, 1955 DOA: 06/17/2017 PCP: [name], MD   LOS: 1 day   Brief Narrative / Interim history: This is a 62 year old male with history of alcoholic cirrhosis, ongoing alcohol abuse, history of esophageal varices, chronic anemia, hypertension, coronary artery disease with prior MI, thrombocytopenia, who presents with generalized weakness, nausea vomiting and diarrhea for the past few days.  He is also been complaining of right leg being more swollen than normal.  He denies any fever or chills at home.  He was given broad-spectrum antibiotics of vancomycin and Zosyn in the emergency room.  He was persistently hypotensive overnight, and this morning his blood cultures grow Streptococcus.  Assessment & Plan: Active Problems:   Cirrhosis, alcoholic ([name])   Anemia, iron deficiency   Dehydration   Hypotension   Acute on chronic renal failure ([name])   Hyponatremia   Thrombocytopenia ([name])   Sepsis ([name])   Leg edema, right   Acute metabolic encephalopathy   Sepsis -?  Related to GI bug with nausea vomiting and diarrhea versus right lower extremity cellulitis.  His right lower leg looks swollen, erythematous and given gram-positive bacteremia may be a source.  Discussed with pharmacy, place patient on penicillin and clindamycin per protocol -Patient is received IV fluids while bolused 3 L as well as another 1-2 additional liters as maintenance overnight, still hypotensive with blood pressures in the 70s, his lactic acid was going back up to 2.7, he is symptomatic with lightheadedness when sitting in bed, I have consulted critical care to evaluate patient for ICU, appreciate input -Patient's blood pressure in the outpatient visits last year was over 952 systolic but cannot fully exclude underlying hypotension at baseline given liver disease  Right lower extremity swelling -Suspect underlying cellulitis, cannot fully exclude DVT,  ultrasound of the right lower extremities pending  Alcoholic cirrhosis of the liver -Patient being followed as an outpatient by Dr. Ardis Hughs, he unfortunately continues to drink about 1 - 40 ounce beer per day -Continue lactulose -Hold spironolactone, furosemide as well as nadolol in the setting of sepsis  Acute kidney injury on chronic kidney disease stage II-III -Baseline creatinine around 1.1-1.2, creatinine 6.5 on admission, improved to 4.5 this morning after fluids -Likely in the setting of sepsis/dehydration as well as hypotension  Hyponatremia -Likely in the setting of dehydration, monitor with fluids  Thrombocytopenia -Likely in the setting of liver disease, no bleeding, monitor  Iron deficiency anemia -No bleeding, continue to monitor   DVT prophylaxis: SCDs Code Status: Full code Family Communication: no family at bedside Disposition Plan: TBD  Consultants:   PCCM  Nephrology   Procedures:   None   Antimicrobials:  Vancomycin 2/20 >> 2/21  Zosyn 2/20 >> 2/21  Penicillin 2/21 >>  Clindamycin 2/21 >>   Subjective: - no chest pain, shortness of breath, no abdominal pain, his nausea vomiting and diarrhea seem to have resolved, complains of lightheadedness  Objective: Vitals:   06/18/17 1015 06/18/17 1016 06/18/17 1030 06/18/17 1100  BP: (!) 73/47 (!) 77/49 (!) 83/53 (!) 74/48  Pulse: (!) 117 (!) 109 (!) 122 (!) 126  Resp: 14 17 (!) 22 19  Temp:      TempSrc:      SpO2: 97% 96% 97% 98%  Weight:      Height:        Intake/Output Summary (Last 24 hours) at 06/18/2017 1201 Last data filed at 06/18/2017 1022 Gross per 24 hour  Intake 3150 ml  Output 1000 ml  Net 2150 ml   Filed Weights   06/18/17 0700  Weight: 68 kg (150 lb)    Examination:  Constitutional: NAD somewhat somnolent Eyes: lids and conjunctivae normal ENMT: Mucous membranes are moist Neck: normal, supple Respiratory: clear to auscultation bilaterally, no wheezing, no crackles.  Normal respiratory effort.  Cardiovascular: Regular rate and rhythm, no murmurs / rubs / gallops.  Right lower extremity swelling with peripheral edema.  No edema on the left. Abdomen: no tenderness. Bowel sounds positive.  Skin: Slight erythematous rash on right lower extremity Neurologic: Nonfocal, moves all 4 Psychiatric: Normal judgment and insight. Alert and oriented x 3. Normal mood.    Data Reviewed: I have independently reviewed following labs and imaging studies   CXR - no infiltrates EKG - sinus rhyhtm  CBC: Recent Labs  Lab 06/17/17 1525 06/18/17 0927  WBC 4.7 5.2  HGB 10.4* 8.5*  HCT 28.9* 23.7*  MCV 93.5 91.9  PLT 63* 43*   Basic Metabolic Panel: Recent Labs  Lab 06/17/17 1525 06/17/17 2024 06/18/17 0927  NA 122*  --  130*  K 5.2*  --  3.6  CL 93*  --  106  CO2 17*  --  12*  GLUCOSE 55*  --  81  BUN 76*  --  67*  CREATININE 6.48*  --  4.50*  CALCIUM 7.5*  --  6.2*  MG  --  1.1* 1.5*  PHOS  --  6.1* 5.2*   GFR: Estimated Creatinine Clearance: 15.4 mL/min (A) (by C-G formula based on SCr of 4.5 mg/dL (H)). Liver Function Tests: Recent Labs  Lab 06/17/17 1525 06/18/17 0927  AST 72* 69*  ALT 24 21  ALKPHOS 63 45  BILITOT 2.1* 2.2*  PROT 6.3* 4.8*  ALBUMIN 2.3* 1.8*   Recent Labs  Lab 06/17/17 1525  LIPASE 23   Recent Labs  Lab 06/17/17 2124  AMMONIA 114*   Coagulation Profile: Recent Labs  Lab 06/17/17 2024  INR 1.38   Cardiac Enzymes: Recent Labs  Lab 06/17/17 2024  CKTOTAL 75   BNP (last 3 results) No results for input(s): PROBNP in the last 8760 hours. HbA1C: No results for input(s): HGBA1C in the last 72 hours. CBG: Recent Labs  Lab 06/18/17 0508 06/18/17 0634 06/18/17 0736 06/18/17 0842 06/18/17 0934  GLUCAP 68 126* 85 86 84   Lipid Profile: No results for input(s): CHOL, HDL, LDLCALC, TRIG, CHOLHDL, LDLDIRECT in the last 72 hours. Thyroid Function Tests: Recent Labs    06/18/17 0927  TSH 1.980   Anemia  Panel: No results for input(s): VITAMINB12, FOLATE, FERRITIN, TIBC, IRON, RETICCTPCT in the last 72 hours. Urine analysis:    Component Value Date/Time   COLORURINE AMBER (A) 06/17/2017 1649   APPEARANCEUR HAZY (A) 06/17/2017 1649   LABSPEC 1.013 06/17/2017 1649   PHURINE 5.0 06/17/2017 1649   GLUCOSEU NEGATIVE 06/17/2017 1649   HGBUR SMALL (A) 06/17/2017 1649   BILIRUBINUR NEGATIVE 06/17/2017 1649   KETONESUR NEGATIVE 06/17/2017 1649   PROTEINUR NEGATIVE 06/17/2017 1649   UROBILINOGEN 2.0 (H) 04/26/2013 0945   NITRITE NEGATIVE 06/17/2017 1649   LEUKOCYTESUR TRACE (A) 06/17/2017 1649   Sepsis Labs: Invalid input(s): PROCALCITONIN, LACTICIDVEN  Recent Results (from the past 240 hour(s))  Blood culture (routine x 2)     Status: None (Preliminary result)   Collection Time: 06/17/17  5:09 PM  Result Value Ref Range Status   Specimen Description BLOOD RIGHT ANTECUBITAL  Final   Special Requests  Final    BOTTLES DRAWN AEROBIC AND ANAEROBIC Blood Culture adequate volume   Culture  Setup Time   Final    GRAM POSITIVE COCCI IN BOTH AEROBIC AND ANAEROBIC BOTTLES CRITICAL RESULT CALLED TO, READ BACK BY AND VERIFIED WITH: Carolann Littler [facility] 06/18/17 1914 JDW Performed at [facility], [place] [address] [place], [place] [address]    Culture GRAM POSITIVE COCCI  Final   Report Status PENDING  Incomplete  Blood culture (routine x 2)     Status: None (Preliminary result)   Collection Time: 06/17/17  5:26 PM  Result Value Ref Range Status   Specimen Description BLOOD LEFT ANTECUBITAL  Final   Special Requests   Final    BOTTLES DRAWN AEROBIC AND ANAEROBIC Blood Culture adequate volume   Culture  Setup Time   Final    Organism ID to follow GRAM POSITIVE COCCI IN BOTH AEROBIC AND ANAEROBIC BOTTLES CRITICAL RESULT CALLED TO, READ BACK BY AND VERIFIED WITH: Carolann Littler [facility] 06/18/17 6213 JDW Performed at [facility], [place] [address], [place], [place] [address]    Culture GRAM  POSITIVE COCCI  Final   Report Status PENDING  Incomplete  Blood Culture ID Panel (Reflexed)     Status: Abnormal   Collection Time: 06/17/17  5:26 PM  Result Value Ref Range Status   Enterococcus species NOT DETECTED NOT DETECTED Final   Listeria monocytogenes NOT DETECTED NOT DETECTED Final   Staphylococcus species NOT DETECTED NOT DETECTED Final   Staphylococcus aureus NOT DETECTED NOT DETECTED Final   Streptococcus species DETECTED (A) NOT DETECTED Final    Comment: CRITICAL RESULT CALLED TO, READ BACK BY AND VERIFIED WITH: K PATTON PHARMD 06/18/17 0735 JDW    Streptococcus agalactiae NOT DETECTED NOT DETECTED Final   Streptococcus pneumoniae NOT DETECTED NOT DETECTED Final   Streptococcus pyogenes DETECTED (A) NOT DETECTED Final    Comment: CRITICAL RESULT CALLED TO, READ BACK BY AND VERIFIED WITH: K PATTON PHARMD 06/18/17 0735 JDW    Acinetobacter baumannii NOT DETECTED NOT DETECTED Final   Enterobacteriaceae species NOT DETECTED NOT DETECTED Final   Enterobacter cloacae complex NOT DETECTED NOT DETECTED Final   Escherichia coli NOT DETECTED NOT DETECTED Final   Klebsiella oxytoca NOT DETECTED NOT DETECTED Final   Klebsiella pneumoniae NOT DETECTED NOT DETECTED Final   Proteus species NOT DETECTED NOT DETECTED Final   Serratia marcescens NOT DETECTED NOT DETECTED Final   Haemophilus influenzae NOT DETECTED NOT DETECTED Final   Neisseria meningitidis NOT DETECTED NOT DETECTED Final   Pseudomonas aeruginosa NOT DETECTED NOT DETECTED Final   Candida albicans NOT DETECTED NOT DETECTED Final   Candida glabrata NOT DETECTED NOT DETECTED Final   Candida krusei NOT DETECTED NOT DETECTED Final   Candida parapsilosis NOT DETECTED NOT DETECTED Final   Candida tropicalis NOT DETECTED NOT DETECTED Final    Comment: Performed at [facility], [place] [address], [name], [place] [address]      Radiology Studies: Ct Abdomen Pelvis Wo Contrast  Result Date: 06/17/2017 CLINICAL DATA:   Abdominal pain, nausea and vomiting, and diarrhea for 4 days. Alcoholic cirrhosis. Chronic kidney disease stage 3. EXAM: CT ABDOMEN AND PELVIS WITHOUT CONTRAST TECHNIQUE: Multidetector CT imaging of the abdomen and pelvis was performed following the standard protocol without IV contrast. COMPARISON:  Ultrasound on 01/23/2017 FINDINGS: Lower chest: No acute findings. Hepatobiliary: No mass visualized on this unenhanced exam. Hepatic cirrhosis demonstrated. Multiple tiny gallstones are seen. Gallbladder is nondilated  and mild diffuse gallbladder wall thickening appears stable compared to previous ultrasound. No evidence of acute pericholecystic inflammatory changes or fluid collections. No evidence of biliary ductal dilatation. Pancreas: No mass or inflammatory process visualized on this unenhanced exam. Spleen:  Within normal limits in size. Adrenals/Urinary tract: No evidence of urolithiasis or hydronephrosis. Small fluid attenuation cyst noted in lower pole of left kidney. Unremarkable unopacified urinary bladder. Stomach/Bowel: No evidence of obstruction, inflammatory process, or abnormal fluid collections. Normal appendix visualized. Vascular/Lymphatic: No pathologically enlarged lymph nodes identified. No evidence of abdominal aortic aneurysm. Aortic atherosclerosis. Esophageal varices, consistent with portal venous hypertension. Mild mesenteric and retroperitoneal edema, without evidence of ascites, also likely secondary to cirrhosis. Reproductive:  No mass or other significant abnormality. Other:  None. Musculoskeletal:  No suspicious bone lesions identified. IMPRESSION: Hepatic cirrhosis, and findings of portal venous hypertension including esophageal varices. Mild mesenteric and retroperitoneal edema, likely secondary to cirrhosis/portal venous hypertension. No evidence of ascites or focal inflammatory process. Cholelithiasis.  No radiographic evidence of acute cholecystitis. Electronically Signed   By: Earle Gell M.D.   On: 06/17/2017 18:06   Dg Chest 2 View  Result Date: 06/17/2017 CLINICAL DATA:  62 year old male with history of sepsis. No chest pain or shortness of breath. EXAM: CHEST  2 VIEW COMPARISON:  Chest x-ray 04/26/2013. FINDINGS: Lung volumes are low. No consolidative airspace disease. No pleural effusions. No pneumothorax. No pulmonary nodule or mass noted. Pulmonary vasculature and the cardiomediastinal silhouette are within normal limits. Old healed fracture in the lateral aspect of the left seventh rib. IMPRESSION: 1. Low lung volumes without radiographic evidence of acute cardiopulmonary disease. Electronically Signed   By: Vinnie Langton M.D.   On: 06/17/2017 21:04   US Renal  Result Date: 06/17/2017 CLINICAL DATA:  Renal failure. EXAM: RENAL / URINARY TRACT ULTRASOUND COMPLETE COMPARISON:  Noncontrast CT earlier this day. Abdominal ultrasound 01/23/2017 FINDINGS: Right Kidney: Length: 10.5 cm. Echogenicity within normal limits. No mass or hydronephrosis visualized. Left Kidney: Length: 11.0 cm. Echogenicity within normal limits. Simple cyst in the lower pole measures 2.7 cm. No solid mass or hydronephrosis visualized. Bladder: Appears normal for degree of bladder distention. Incidental note of distended gallbladder containing stones and sludge. IMPRESSION: No obstructive uropathy. Left renal cyst, otherwise unremarkable sonographic appearance of both kidneys. Electronically Signed   By: Jeb Levering M.D.   On: 06/17/2017 22:56     Scheduled Meds: . folic acid  1 mg Intravenous Daily  . lactulose  20 g Oral TID  . multivitamin with minerals  1 tablet Oral Once  . thiamine  100 mg Intravenous Daily  . thiamine  100 mg Oral Once   Continuous Infusions: . sodium chloride 75 mL/hr at 06/18/17 0811  . clindamycin (CLEOCIN) IV 900 mg (06/18/17 1047)  . dextrose 5 % and 0.9% NaCl 75 mL/hr at 06/18/17 0523  . pencillin G potassium IV      Marzetta Board, MD, PhD [facility] Pager [phone]  If 7PM-7AM, please contact night-coverage [email or web address] Password [facility] 06/18/2017, 12:01 PM

## 2017-06-18 NOTE — ED Notes (Signed)
Pt discussed with dr Bayard Males  Dextrose 50 5 ORDERED IV UNTIL ADMITTING DOCTOR  ADVISED

## 2017-06-18 NOTE — Consult Note (Addendum)
PULMONARY / CRITICAL CARE MEDICINE   Name: Mark [name] MRN: [id number] DOB: [birth year]    ADMISSION DATE:  06/17/2017 CONSULTATION DATE: 06/18/2017  REFERRING MD: [facility]  CHIEF COMPLAINT: Right leg pain  HISTORY OF PRESENT ILLNESS:   62 year old male with history of alcoholic cirrhosis, alcohol abuse, he likes to snort heroin on a regular basis, he has chronic anemia chronic hypotension history of coronary artery disease with an MI. He reports being his usual state of health until previous Friday which time he notes swelling of his right lower extremity and was painful.  For the pain he snorted cocaine prior to being admitted to [facility]. Is been fluid resuscitated for elevated pro-calcitonin for a right lower extremity cellulitis treated with antimicrobial therapy.  He does have alcoholic cirrhosis and he does have abdominal distention. He is awake alert in no acute distress.  He is able to carry on conversations without problem.  Fortunately his blood pressure remains low despite fluid resuscitation as documented 72/52.  Suspect his continuing to take diuretics while he was having nausea vomiting diarrhea may have contributed to his acute renal failure. Due to his refractory hypotension, right lower extremity cellulitis and the fact he has cirrhosis from substance abuse he will be admitted to the ICU for further evaluation and treatment.  PAST MEDICAL HISTORY :  He  has a past medical history of Alcohol abuse, Anemia, Arthritis, Ascites, Cirrhosis ([place]), Heart murmur, Hyperlipidemia, Hypertension, Leg swelling, Myocardial infarction ([place]) (2012), Stroke ([place]) (2012), and Thrombocytopenia ([place]).  PAST SURGICAL HISTORY: He  has a past surgical history that includes Knee arthroscopy; Hernia repair (Right); Total knee arthroplasty (Right, 05/02/2013); Colonoscopy with propofol (N/A, 02/07/2016); and Esophagogastroduodenoscopy (egd) with propofol (N/A, 02/07/2016).  No Known  Allergies  No current facility-administered medications on file prior to encounter.    Current Outpatient Medications on File Prior to Encounter  Medication Sig  . furosemide (LASIX) 20 MG tablet Take 1 tablet (20 mg total) by mouth 2 (two) times daily.  . nadolol (CORGARD) 20 MG tablet Take 1 tablet (20 mg total) by mouth daily.  Marland Kitchen spironolactone (ALDACTONE) 50 MG tablet Take 2 tablets (100 mg total) by mouth 2 (two) times daily.    FAMILY HISTORY:  His indicated that his mother is deceased. He indicated that his father is deceased. He indicated that all of his four sisters are alive. He indicated that all of his four brothers are alive. He indicated that his maternal grandmother is deceased. He indicated that his maternal grandfather is deceased. He indicated that his paternal grandmother is deceased. He indicated that his paternal grandfather is deceased.   SOCIAL HISTORY: He  reports that he quit smoking about 17 months ago. His smoking use included cigarettes. He has a 5.00 pack-year smoking history. he has never used smokeless tobacco. He reports that he uses drugs. Drugs: Marijuana and Heroin. He reports that he does not drink alcohol.  REVIEW OF SYSTEMS:   10 point review of system taken, please see HPI for positives and negatives.   SUBJECTIVE:  62 year old in no acute distress but hypotensive by cuff.  VITAL SIGNS: BP (!) 77/46   Pulse 95   Temp 98 F (36.7 C) (Rectal)   Resp 14   Ht 5' 6.5" (1.689 m)   Wt 68 kg (150 lb)   SpO2 100%   BMI 23.85 kg/m   HEMODYNAMICS:    VENTILATOR SETTINGS:    INTAKE / OUTPUT: I/O last 3 completed shifts: In:  2050 [IV [id number]] Out: -   PHYSICAL EXAMINATION: General: Appears older than stated age, awake alert able to carry on conversations Neuro: Grossly intact HEENT: No JVD lymphadenopathy appreciated Cardiovascular: Heart sounds are regular regular rate and rhythm Lungs: Mild rhonchi Abdomen:  Distended Musculoskeletal: Right lower extremity swollen with erythremia right total knee replacement scar noted Skin: Right leg hot to touch  LABS:  BMET Recent Labs  Lab 06/17/17 1525 06/18/17 0927  NA 122* 130*  K 5.2* 3.6  CL 93* 106  CO2 17* 12*  BUN 76* 67*  CREATININE 6.48* 4.50*  GLUCOSE 55* 81    Electrolytes Recent Labs  Lab 06/17/17 1525 06/17/17 2024 06/18/17 0927  CALCIUM 7.5*  --  6.2*  MG  --  1.1* 1.5*  PHOS  --  6.1* 5.2*    CBC Recent Labs  Lab 06/17/17 1525 06/18/17 0927  WBC 4.7 5.2  HGB 10.4* 8.5*  HCT 28.9* 23.7*  PLT 63* 43*    Coag's Recent Labs  Lab 06/17/17 2024  INR 1.38    Sepsis Markers Recent Labs  Lab 06/17/17 1548 06/17/17 1717 06/18/17 0927  LATICACIDVEN 2.01* 1.88 2.7*  PROCALCITON  --   --  20.28    ABG No results for input(s): PHART, PCO2ART, PO2ART in the last 168 hours.  Liver Enzymes Recent Labs  Lab 06/17/17 1525 06/18/17 0927  AST 72* 69*  ALT 24 21  ALKPHOS 63 45  BILITOT 2.1* 2.2*  ALBUMIN 2.3* 1.8*    Cardiac Enzymes No results for input(s): TROPONINI, PROBNP in the last 168 hours.  Glucose Recent Labs  Lab 06/18/17 0327 06/18/17 0508 06/18/17 0634 06/18/17 0736 06/18/17 0842 06/18/17 0934  GLUCAP 135* 68 126* 85 86 84    Imaging Ct Abdomen Pelvis Wo Contrast  Result Date: 06/17/2017 CLINICAL DATA:  Abdominal pain, nausea and vomiting, and diarrhea for 4 days. Alcoholic cirrhosis. Chronic kidney disease stage 3. EXAM: CT ABDOMEN AND PELVIS WITHOUT CONTRAST TECHNIQUE: Multidetector CT imaging of the abdomen and pelvis was performed following the standard protocol without IV contrast. COMPARISON:  Ultrasound on 01/23/2017 FINDINGS: Lower chest: No acute findings. Hepatobiliary: No mass visualized on this unenhanced exam. Hepatic cirrhosis demonstrated. Multiple tiny gallstones are seen. Gallbladder is nondilated and mild diffuse gallbladder wall thickening appears stable compared to  previous ultrasound. No evidence of acute pericholecystic inflammatory changes or fluid collections. No evidence of biliary ductal dilatation. Pancreas: No mass or inflammatory process visualized on this unenhanced exam. Spleen:  Within normal limits in size. Adrenals/Urinary tract: No evidence of urolithiasis or hydronephrosis. Small fluid attenuation cyst noted in lower pole of left kidney. Unremarkable unopacified urinary bladder. Stomach/Bowel: No evidence of obstruction, inflammatory process, or abnormal fluid collections. Normal appendix visualized. Vascular/Lymphatic: No pathologically enlarged lymph nodes identified. No evidence of abdominal aortic aneurysm. Aortic atherosclerosis. Esophageal varices, consistent with portal venous hypertension. Mild mesenteric and retroperitoneal edema, without evidence of ascites, also likely secondary to cirrhosis. Reproductive:  No mass or other significant abnormality. Other:  None. Musculoskeletal:  No suspicious bone lesions identified. IMPRESSION: Hepatic cirrhosis, and findings of portal venous hypertension including esophageal varices. Mild mesenteric and retroperitoneal edema, likely secondary to cirrhosis/portal venous hypertension. No evidence of ascites or focal inflammatory process. Cholelithiasis.  No radiographic evidence of acute cholecystitis. Electronically Signed   By: Earle Gell M.D.   On: 06/17/2017 18:06   Dg Chest 2 View  Result Date: 06/17/2017 CLINICAL DATA:  62 year old male with history of sepsis. No chest pain or  shortness of breath. EXAM: CHEST  2 VIEW COMPARISON:  Chest x-ray 04/26/2013. FINDINGS: Lung volumes are low. No consolidative airspace disease. No pleural effusions. No pneumothorax. No pulmonary nodule or mass noted. Pulmonary vasculature and the cardiomediastinal silhouette are within normal limits. Old healed fracture in the lateral aspect of the left seventh rib. IMPRESSION: 1. Low lung volumes without radiographic evidence of  acute cardiopulmonary disease. Electronically Signed   By: Vinnie Langton M.D.   On: 06/17/2017 21:04   US Renal  Result Date: 06/17/2017 CLINICAL DATA:  Renal failure. EXAM: RENAL / URINARY TRACT ULTRASOUND COMPLETE COMPARISON:  Noncontrast CT earlier this day. Abdominal ultrasound 01/23/2017 FINDINGS: Right Kidney: Length: 10.5 cm. Echogenicity within normal limits. No mass or hydronephrosis visualized. Left Kidney: Length: 11.0 cm. Echogenicity within normal limits. Simple cyst in the lower pole measures 2.7 cm. No solid mass or hydronephrosis visualized. Bladder: Appears normal for degree of bladder distention. Incidental note of distended gallbladder containing stones and sludge. IMPRESSION: No obstructive uropathy. Left renal cyst, otherwise unremarkable sonographic appearance of both kidneys. Electronically Signed   By: Jeb Levering M.D.   On: 06/17/2017 22:56     STUDIES:    CULTURES: 221 blood cultures x2>>   ANTIBIOTICS: 06/18/2017 Cleocin>> 06/18/2017 penicillin G>> 06/18/2017 vancomycin>>  SIGNIFICANT EVENTS:   LINES/TUBES:   DISCUSSION: 62 year old male with history of alcoholic cirrhosis, alcohol abuse, he likes to snort heroin on a regular basis, he has chronic anemia chronic hypotension history of coronary artery disease with an MI. He reports being his usual state of health until previous Friday which time he notes swelling of his right lower extremity and was painful.  For the pain he snorted cocaine prior to being admitted to [facility]. Is been fluid resuscitated for elevated pro-calcitonin for a right lower extremity cellulitis treated with antimicrobial therapy.  He does have alcoholic cirrhosis and he does have abdominal distention. He is awake alert in no acute distress.  He is able to carry on conversations without problem.  Fortunately his blood pressure remains low despite fluid resuscitation as documented 72/52.  Suspect his continuing to take  diuretics while he was having nausea vomiting diarrhea may have contributed to his acute renal failure. Due to his refractory hypotension, right lower extremity cellulitis and the fact he has cirrhosis from substance abuse he will be admitted to the ICU for further evaluation and treatment.  ASSESSMENT / PLAN:  PULMONARY A: Tobacco abuse Heroin abuse P:   O2 as needed  CARDIOVASCULAR A:  Chronic hypotension Questionable shock Sepsis supported by pro-calcitonin of 20.20 and elevated lactic acid P:  Fluid resuscitation as needed Hold diuretics Need to place arterial line to get accurate blood pressures  RENAL Lab Results  Component Value Date   CREATININE 4.50 (H) 06/18/2017   CREATININE 6.48 (H) 06/17/2017   CREATININE 1.15 01/19/2017   CREATININE 0.74 12/05/2013   CREATININE 0.82 12/01/2013   Recent Labs  Lab 06/17/17 1525 06/18/17 0927  K 5.2* 3.6   A:   Acute on chronic renal failure Continuation of diuretics while having nausea vomiting and diarrhea. Continued substance abuse and alcohol abuse Lactic acidosis P:   Hydration Follow creatinine No need for dialysis at this time.  Follow lactic acid   GASTROINTESTINAL A:   History of nausea Recent diarrhea Alcoholic cirrhosis P:   No recent CT of abdomen Antiemetics as needed No need for paracentesis at this time. Consider albumin for fluid resuscitation due to his history of cirrhosis  HEMATOLOGIC Recent Labs    06/17/17 1525 06/18/17 0927  HGB 10.4* 8.5*    A:  Anemia P:  Follow hemoglobin  INFECTIOUS A:   Presumed cellulitis right lower extremity P:   Antibiotics per flow sheet  ENDOCRINE CBG (last 3)  Recent Labs    06/18/17 0736 06/18/17 0842 06/18/17 0934  GLUCAP 85 86 84    A:   No acute issues P:   Monitor glucose  NEUROLOGIC A:   Chronic polysubstance abuse Snorts heroin on a frequent basis History of EtOH abuse now drinks approximately 40 ounces of beer  daily History of prior CVA P:   RASS goal:  Folic acid Thiamine   FAMILY  - Updates: Patient updated at bedside  - Inter-disciplinary family meet or Palliative Care meeting due by:  day 7    Steve Ediel Unangst ACNP [facility] PCCM Pager [phone] till 1 pm If no answer page [phone] 06/18/2017, 12:05 PM

## 2017-06-18 NOTE — Progress Notes (Signed)
Pharmacy Antibiotic Note  Mark [name] is a 62 y.o. male admitted on 06/17/2017 with sepsis.  Pharmacy has been consulted for vancomycin and zosyn dosing. Blood cultures growing 2/2 gram positive cocci. Acute renal failure noted with sCr 6.48 and CrCl ~ 10 ml/min.  Vancomycin trough goal 15-20  Plan: 1) Vancomycin 1g IV q48 2) Zosyn 2.25g IV q8 3) Follow up blood cultures, renal function, LOT, level as needed  Height: 5' 6.5" (168.9 cm) Weight: 150 lb (68 kg) IBW/kg (Calculated) : 64.95  Temp (24hrs), Avg:94.7 F (34.8 C), Min:94 F (34.4 C), Max:95.4 F (35.2 C)  Recent Labs  Lab 06/17/17 1525 06/17/17 1548 06/17/17 1717  WBC 4.7  --   --   CREATININE 6.48*  --   --   LATICACIDVEN  --  2.01* 1.88    Estimated Creatinine Clearance: 10.7 mL/min (A) (by C-G formula based on SCr of 6.48 mg/dL (H)).    No Known Allergies  Antimicrobials this admission: 2/20 Vancomcyin >> 2/20 Zosyn >>  Dose adjustments this admission: n/a  Microbiology results: 2/20 blood >> 2/2 GPC  Thank you for allowing pharmacy to be a part of this patient's care.  Deboraha Sprang 06/18/2017 7:32 AM

## 2017-06-18 NOTE — ED Notes (Signed)
Pt asking  What  Time is it day or night/  Asking to call his wife suggested that he call her a little later.  He asked if he had acted foolish earlier

## 2017-06-18 NOTE — ED Notes (Signed)
Pt moved to a regular hospital bed for comfort pt keeps moving around in bed non-verbal

## 2017-06-18 NOTE — Progress Notes (Signed)
CSW went to assess pt at bedside-where pt was asleep. CSW will try back later to gather collateral information pt and pt's needs.     Virgie Dad Alanson Hausmann, MSW, [place] [facility] [facility] Clinical Social Worker [phone]

## 2017-06-18 NOTE — ED Notes (Signed)
Th pr reports that he has an irregular pulse  His rhy is af

## 2017-06-18 NOTE — Progress Notes (Addendum)
Right lower extremity venous duplex has been completed. Negative for DVT. Ultrasound characteristics of enlarged, vascularized lymph node measuring 0.7 cm high by 1.8 cm wide by 1.1 cm long is noted in the groin. Results were given to the patient's nurse, Jenny Reichmann.  06/18/17 2:00 PM Carlos Levering RVT

## 2017-06-18 NOTE — ED Notes (Signed)
Admitting aware of bp and sugar

## 2017-06-18 NOTE — ED Notes (Signed)
Admitting of aware of bp and heart rate and afib rhythm

## 2017-06-18 NOTE — ED Notes (Signed)
Pt will not keep his arm straight so his iv can run in

## 2017-06-18 NOTE — ED Notes (Signed)
Mark [name] [facility] NP CALLED BACK  ORDERS GIVEN FOR   FURTHER Cre  Aware of increased blood sugar  After the d50

## 2017-06-18 NOTE — Clinical Social Work Note (Signed)
Clinical Social Work Assessment  Patient Details  Name: Mark [name] MRN: [id number] Date of Birth: August 19, 1955  Date of referral:  06/18/17               Reason for consult:  Substance Use/ETOH Abuse                Permission sought to share information with:    Permission granted to share information::  No  Name::        Agency::     Relationship::     Contact Information:     Housing/Transportation Living arrangements for the past 2 months:  Single Family Home(home with wife.) Source of Information:  Patient Patient Interpreter Needed:  None Criminal Activity/Legal Involvement Pertinent to Current Situation/Hospitalization:  No - Comment as needed Significant Relationships:  Adult Children, Spouse, Other Family Members Lives with:  Spouse Do you feel safe going back to the place where you live?  Yes Need for family participation in patient care:  Yes (Comment)  Care giving concerns:  CSW spoke with pt at bedside. Per pt report, pt has no concerns about anything at this time.    Social Worker assessment / plan:  CSW spoke with pt at bedside. During this time CSW was informed that pt is from home with wife where pt reports that pt's wife takes care of pt. Per pt, pt also has a total of 5 children. Pt didn't express if they are involved in pt's care at this time. Pt expressed that pt is not using any substances but RN informed CSW that pt came back positive for opioids as well as ETOH use. CSW offered resources to pt and pt declined at this time. Pt expressed being agreeable to rehab if it is needed at the time of discharge.  Employment status:  Other (Comment)(unknown at this time. ) Insurance information:  Other (Comment Required)(BCBS) PT Recommendations:  Not assessed at this time Information / Referral to community resources:     Patient/Family's Response to care:  Pt was falling asleep while talking to CSW but was able to voice that pt understood and was agreeable to  plan of care at this time.   Patient/Family's Understanding of and Emotional Response to Diagnosis, Current Treatment, and Prognosis:  No further questions or concerns have been presented to CSW at this time.   Emotional Assessment Appearance:  Appears older than stated age Attitude/Demeanor/Rapport:  Other(pt was falling asleep while speaking with CSW. ) Affect (typically observed):  Other(pt presented with some sarcasim ) Orientation:  Oriented to Self, Oriented to Place, Oriented to  Time, Oriented to Situation Alcohol / Substance use:  Illicit Drugs, Alcohol Use Psych involvement (Current and /or in the community):  No (Comment)(not at this time. )  Discharge Needs  Concerns to be addressed:  Substance Abuse Concerns Readmission within the last 30 days:  No Current discharge risk:  Dependent with Mobility, Substance Abuse Barriers to Discharge:  Continued Medical Work up, Active Substance Use   Wetzel Bjornstad, [place] 06/18/2017, 8:04 AM

## 2017-06-18 NOTE — ED Notes (Signed)
Admitting aware of patient bp

## 2017-06-18 NOTE — ED Notes (Signed)
Admitting doctor at the bedside 

## 2017-06-18 NOTE — ED Notes (Signed)
Sent admitting MD a text page to inform of critical lactic 2.6

## 2017-06-18 NOTE — ED Notes (Signed)
Admitting MD at bedside.

## 2017-06-18 NOTE — Consult Note (Signed)
[facility] Renal Consultation Note  Requesting MD: Cruzita Lederer Indication for Consultation: AKI  HPI:  Mark [name] is a 62 y.o. male with past medical history significant for alcohol abuse and cirrhosis-portal hypertension, history of varices, coronary artery disease and anemia.  There is also question of other substance abuses.  There is a creatinine in the system from late September 2018 that was 1.15.  According to his wife he was in his usual state of health until the weekend when he developed nausea and also right lower extremity pain.  He presented to the emergency department on 2/20.  Creatinine was noted to be 6.48 with an elevated lactate and pro calcitonin.  He was felt to be dry.  Has been volume resuscitated with creatinine improvement to 4.5 this morning.  1800 of urine has been recorded from a condom catheter.  Patient is somnolent but arousable, most of history was obtained from wife.  He continues to be hypotensive.  CCM has been consulted-sepsis from leg cellulitis  is suspected  Creat  Date/Time Value Ref Range Status  12/05/2013 01:20 PM 0.74 0.50 - 1.35 mg/dL Final  12/01/2013 12:03 PM 0.82 0.50 - 1.35 mg/dL Final   Creatinine, Ser  Date/Time Value Ref Range Status  06/18/2017 09:27 AM 4.50 (H) 0.61 - 1.24 mg/dL Final  06/17/2017 03:25 PM 6.48 (H) 0.61 - 1.24 mg/dL Final  01/19/2017 10:45 AM 1.15 0.40 - 1.50 mg/dL Final  08/12/2016 04:31 PM 1.20 0.40 - 1.50 mg/dL Final  05/13/2016 11:33 AM 1.06 0.40 - 1.50 mg/dL Final  01/08/2016 10:49 AM 1.13 0.40 - 1.50 mg/dL Final  10/18/2015 11:58 AM 0.89 0.40 - 1.50 mg/dL Final  10/16/2014 08:57 AM 1.07 0.40 - 1.50 mg/dL Final  09/06/2014 10:27 AM 1.17 0.40 - 1.50 mg/dL Final  02/13/2014 04:33 PM 1.2 0.4 - 1.5 mg/dL Final  05/03/2013 05:05 AM 1.63 (H) 0.50 - 1.35 mg/dL Final  04/26/2013 10:00 AM 1.03 0.50 - 1.35 mg/dL Final  12/22/2012 09:11 AM 1.0 0.4 - 1.5 mg/dL Final  05/23/2011 09:31 AM 0.9 0.4 - 1.5  mg/dL Final  02/07/2011 10:10 AM 0.66 0.50 - 1.35 mg/dL Final  12/20/2010 10:49 AM 0.7 0.4 - 1.5 mg/dL Final  09/03/2010 10:54 AM 0.5 0.4 - 1.5 mg/dL Final  09/03/2010 10:54 AM 0.5 0.4 - 1.5 mg/dL Final  07/19/2010 06:30 AM 0.71 0.4 - 1.5 mg/dL Final  07/18/2010 04:20 AM 0.60 0.4 - 1.5 mg/dL Final  07/17/2010 05:00 AM 0.50 0.4 - 1.5 mg/dL Final  07/16/2010 08:48 PM 0.60 0.4 - 1.5 mg/dL Final  11/14/2009 04:21 AM 0.62 0.4 - 1.5 mg/dL Final  11/03/2009 03:35 AM 0.66 0.4 - 1.5 mg/dL Final  11/01/2009 02:21 PM 0.51 0.4 - 1.5 mg/dL Final     PMHx:   Past Medical History:  Diagnosis Date  . Alcohol abuse   . Anemia   . Arthritis   . Ascites   . Cirrhosis ([place])   . Heart murmur   . Hyperlipidemia   . Hypertension   . Leg swelling   . Myocardial infarction ([place]) 2012  . Stroke [facility]) 2012   no deficits  . Thrombocytopenia ([place])     Past Surgical History:  Procedure Laterality Date  . COLONOSCOPY WITH PROPOFOL N/A 02/07/2016   Procedure: COLONOSCOPY WITH PROPOFOL;  Surgeon: Milus Banister, MD;  Location: [facility] ENDOSCOPY;  Service: Endoscopy;  Laterality: N/A;  . ESOPHAGOGASTRODUODENOSCOPY (EGD) WITH PROPOFOL N/A 02/07/2016   Procedure: ESOPHAGOGASTRODUODENOSCOPY (EGD) WITH PROPOFOL;  Surgeon: Melene Plan  Ardis Hughs, MD;  Location: [facility];  Service: Endoscopy;  Laterality: N/A;  . HERNIA REPAIR Right    inguinal  . KNEE ARTHROSCOPY     bilateral/  12/14  . TOTAL KNEE ARTHROPLASTY Right 05/02/2013   Procedure: RIGHT TOTAL KNEE ARTHROPLASTY;  Surgeon: Mauri Pole, MD;  Location: [facility] ORS;  Service: Orthopedics;  Laterality: Right;    Family Hx:  Family History  Problem Relation Age of Onset  . Hypertension Father   . Diabetes Father   . Dementia Mother   . Lupus Sister     Social History:  reports that he quit smoking about 17 months ago. His smoking use included cigarettes. He has a 5.00 pack-year smoking history. he has never used smokeless tobacco. He reports that he uses  drugs. Drugs: Marijuana and Heroin. He reports that he does not drink alcohol.  Allergies: No Known Allergies  Medications: Prior to Admission medications   Medication Sig Start Date End Date Taking? Authorizing Provider  furosemide (LASIX) 20 MG tablet Take 1 tablet (20 mg total) by mouth 2 (two) times daily. 01/19/17  Yes Levin Erp, PA  nadolol (CORGARD) 20 MG tablet Take 1 tablet (20 mg total) by mouth daily. 01/19/17  Yes Levin Erp, PA  spironolactone (ALDACTONE) 50 MG tablet Take 2 tablets (100 mg total) by mouth 2 (two) times daily. 01/19/17  Yes Levin Erp, PA    I have reviewed the patient's current medications.  Labs:  Results for orders placed or performed during the hospital encounter of 06/17/17 (from the past 48 hour(s))  Lipase, blood     Status: None   Collection Time: 06/17/17  3:25 PM  Result Value Ref Range   Lipase 23 11 - 51 U/L    Comment: Performed at [facility], [place] [address], [place], [place] [address]  Comprehensive metabolic panel     Status: Abnormal   Collection Time: 06/17/17  3:25 PM  Result Value Ref Range   Sodium 122 (L) 135 - 145 mmol/L   Potassium 5.2 (H) 3.5 - 5.1 mmol/L   Chloride 93 (L) 101 - 111 mmol/L   CO2 17 (L) 22 - 32 mmol/L   Glucose, Bld 55 (L) 65 - 99 mg/dL   BUN 76 (H) 6 - 20 mg/dL   Creatinine, Ser 6.48 (H) 0.61 - 1.24 mg/dL   Calcium 7.5 (L) 8.9 - 10.3 mg/dL   Total Protein 6.3 (L) 6.5 - 8.1 g/dL   Albumin 2.3 (L) 3.5 - 5.0 g/dL   AST 72 (H) 15 - 41 U/L   ALT 24 17 - 63 U/L   Alkaline Phosphatase 63 38 - 126 U/L   Total Bilirubin 2.1 (H) 0.3 - 1.2 mg/dL   GFR calc non Af Amer 8 (L) >60 mL/min   GFR calc Af Amer 9 (L) >60 mL/min    Comment: (NOTE) The eGFR has been calculated using the CKD EPI equation. This calculation has not been validated in all clinical situations. eGFR's persistently <60 mL/min signify possible Chronic Kidney Disease.    Anion gap 12 5 - 15    Comment:  Performed at [place] [address], [place] [address]  CBC     Status: Abnormal   Collection Time: 06/17/17  3:25 PM  Result Value Ref Range   WBC 4.7 4.0 - 10.5 K/uL   RBC 3.09 (L) 4.22 - 5.81 MIL/uL   Hemoglobin 10.4 (L) 13.0 - 17.0 g/dL  HCT 28.9 (L) 39.0 - 52.0 %   MCV 93.5 78.0 - 100.0 fL   MCH 33.7 26.0 - 34.0 pg   MCHC 36.0 30.0 - 36.0 g/dL   RDW 12.6 11.5 - 15.5 %   Platelets 63 (L) 150 - 400 K/uL    Comment: REPEATED TO VERIFY SPECIMEN CHECKED FOR CLOTS PLATELET COUNT CONFIRMED BY SMEAR Performed at [facility], [place] [address] [place], [place] [address]   I-Stat Troponin, ED (not at [facility])     Status: None   Collection Time: 06/17/17  3:46 PM  Result Value Ref Range   Troponin i, poc 0.00 0.00 - 0.08 ng/mL   Comment 3            Comment: Due to the release kinetics of cTnI, a negative result within the first hours of the onset of symptoms does not rule out myocardial infarction with certainty. If myocardial infarction is still suspected, repeat the test at appropriate intervals.   I-Stat CG4 Lactic Acid, ED     Status: Abnormal   Collection Time: 06/17/17  3:48 PM  Result Value Ref Range   Lactic Acid, Venous 2.01 (HH) 0.5 - 1.9 mmol/L   Comment NOTIFIED PHYSICIAN   Urinalysis, Routine w reflex microscopic     Status: Abnormal   Collection Time: 06/17/17  4:49 PM  Result Value Ref Range   Color, Urine AMBER (A) YELLOW    Comment: BIOCHEMICALS MAY BE AFFECTED BY COLOR   APPearance HAZY (A) CLEAR   Specific Gravity, Urine 1.013 1.005 - 1.030   pH 5.0 5.0 - 8.0   Glucose, UA NEGATIVE NEGATIVE mg/dL   Hgb urine dipstick SMALL (A) NEGATIVE   Bilirubin Urine NEGATIVE NEGATIVE   Ketones, ur NEGATIVE NEGATIVE mg/dL   Protein, ur NEGATIVE NEGATIVE mg/dL   Nitrite NEGATIVE NEGATIVE   Leukocytes, UA TRACE (A) NEGATIVE   RBC / HPF 0-5 0 - 5 RBC/hpf   WBC, UA 6-30 0 - 5 WBC/hpf   Bacteria, UA RARE (A) NONE SEEN   Squamous Epithelial / LPF  0-5 (A) NONE SEEN   Hyaline Casts, UA PRESENT    Non Squamous Epithelial 0-5 (A) NONE SEEN    Comment: Performed at [facility], [place] [address], [place], [place] [address]  Blood culture (routine x 2)     Status: None (Preliminary result)   Collection Time: 06/17/17  5:09 PM  Result Value Ref Range   Specimen Description BLOOD RIGHT ANTECUBITAL    Special Requests      BOTTLES DRAWN AEROBIC AND ANAEROBIC Blood Culture adequate volume   Culture  Setup Time      GRAM POSITIVE COCCI IN BOTH AEROBIC AND ANAEROBIC BOTTLES CRITICAL RESULT CALLED TO, READ BACK BY AND VERIFIED WITH: Carolann Littler [facility] 06/18/17 4818 JDW Performed at [facility], [place] [address] [place], [place] [address]    Culture GRAM POSITIVE COCCI    Report Status PENDING   I-Stat CG4 Lactic Acid, ED     Status: None   Collection Time: 06/17/17  5:17 PM  Result Value Ref Range   Lactic Acid, Venous 1.88 0.5 - 1.9 mmol/L  Blood culture (routine x 2)     Status: None (Preliminary result)   Collection Time: 06/17/17  5:26 PM  Result Value Ref Range   Specimen Description BLOOD LEFT ANTECUBITAL    Special Requests      BOTTLES DRAWN AEROBIC AND ANAEROBIC Blood Culture adequate volume  Culture  Setup Time      Organism ID to follow GRAM POSITIVE COCCI IN BOTH AEROBIC AND ANAEROBIC BOTTLES CRITICAL RESULT CALLED TO, READ BACK BY AND VERIFIED WITH: Carolann Littler [facility] 06/18/17 5681 JDW Performed at [facility], [address] [place], [place] [address]    Culture GRAM POSITIVE COCCI    Report Status PENDING   Blood Culture ID Panel (Reflexed)     Status: Abnormal   Collection Time: 06/17/17  5:26 PM  Result Value Ref Range   Enterococcus species NOT DETECTED NOT DETECTED   Listeria monocytogenes NOT DETECTED NOT DETECTED   Staphylococcus species NOT DETECTED NOT DETECTED   Staphylococcus aureus NOT DETECTED NOT DETECTED   Streptococcus species DETECTED (A) NOT DETECTED    Comment: CRITICAL RESULT CALLED  TO, READ BACK BY AND VERIFIED WITH: K PATTON PHARMD 06/18/17 0735 JDW    Streptococcus agalactiae NOT DETECTED NOT DETECTED   Streptococcus pneumoniae NOT DETECTED NOT DETECTED   Streptococcus pyogenes DETECTED (A) NOT DETECTED    Comment: CRITICAL RESULT CALLED TO, READ BACK BY AND VERIFIED WITH: K PATTON PHARMD 06/18/17 0735 JDW    Acinetobacter baumannii NOT DETECTED NOT DETECTED   Enterobacteriaceae species NOT DETECTED NOT DETECTED   Enterobacter cloacae complex NOT DETECTED NOT DETECTED   Escherichia coli NOT DETECTED NOT DETECTED   Klebsiella oxytoca NOT DETECTED NOT DETECTED   Klebsiella pneumoniae NOT DETECTED NOT DETECTED   Proteus species NOT DETECTED NOT DETECTED   Serratia marcescens NOT DETECTED NOT DETECTED   Haemophilus influenzae NOT DETECTED NOT DETECTED   Neisseria meningitidis NOT DETECTED NOT DETECTED   Pseudomonas aeruginosa NOT DETECTED NOT DETECTED   Candida albicans NOT DETECTED NOT DETECTED   Candida glabrata NOT DETECTED NOT DETECTED   Candida krusei NOT DETECTED NOT DETECTED   Candida parapsilosis NOT DETECTED NOT DETECTED   Candida tropicalis NOT DETECTED NOT DETECTED    Comment: Performed at [place] [address], [place], [place] [address]  CK     Status: None   Collection Time: 06/17/17  8:24 PM  Result Value Ref Range   Total CK 75 49 - 397 U/L    Comment: Performed at [facility], [place] [address], [name], [place] [address]  Magnesium     Status: Abnormal   Collection Time: 06/17/17  8:24 PM  Result Value Ref Range   Magnesium 1.1 (L) 1.7 - 2.4 mg/dL    Comment: Performed at [place] [facility] [address], [place], [place] [address]  Phosphorus     Status: Abnormal   Collection Time: 06/17/17  8:24 PM  Result Value Ref Range   Phosphorus 6.1 (H) 2.5 - 4.6 mg/dL    Comment: Performed at [facility] [address] [place], [place] [address]  Protime-INR     Status: Abnormal   Collection Time: 06/17/17  8:24 PM   Result Value Ref Range   Prothrombin Time 16.8 (H) 11.4 - 15.2 seconds   INR 1.38     Comment: Performed at [place] [address], [place], [place] [address]  Ammonia     Status: Abnormal   Collection Time: 06/17/17  9:24 PM  Result Value Ref Range   Ammonia 114 (H) 9 - 35 umol/L    Comment: Performed at [facility], [place] [address] [place], [place] [address]  Influenza panel by PCR (type A & B)     Status: None  Collection Time: 06/17/17 11:21 PM  Result Value Ref Range   Influenza A By PCR NEGATIVE NEGATIVE   Influenza B By PCR NEGATIVE NEGATIVE    Comment: (NOTE) The Xpert Xpress Flu assay is intended as an aid in the diagnosis of  influenza and should not be used as a sole basis for treatment.  This  assay is FDA approved for nasopharyngeal swab specimens only. Nasal  washings and aspirates are unacceptable for Xpert Xpress Flu testing. Performed at [facility], [place] [address], [place], [place] [address]   CBG monitoring, ED     Status: Abnormal   Collection Time: 06/18/17  3:01 AM  Result Value Ref Range   Glucose-Capillary <10 (LL) 65 - 99 mg/dL  CBG monitoring, ED     Status: Abnormal   Collection Time: 06/18/17  3:27 AM  Result Value Ref Range   Glucose-Capillary 135 (H) 65 - 99 mg/dL  CBG monitoring, ED     Status: None   Collection Time: 06/18/17  5:08 AM  Result Value Ref Range   Glucose-Capillary 68 65 - 99 mg/dL  Creatinine, urine, random     Status: None   Collection Time: 06/18/17  5:43 AM  Result Value Ref Range   Creatinine, Urine 54.08 mg/dL    Comment: Performed at [facility], [place] [address] [place], [place] [address]  Sodium, urine, random     Status: None   Collection Time: 06/18/17  5:43 AM  Result Value Ref Range   Sodium, Ur 29 mmol/L    Comment: Performed at [facility] [address], [place], [place] [address]  Osmolality, urine     Status: Abnormal   Collection Time: 06/18/17  5:43 AM  Result  Value Ref Range   Osmolality, Ur 218 (L) 300 - 900 mOsm/kg    Comment: Performed at [facility] [address], [place], [place] [address]  Urine rapid drug screen (hosp performed)     Status: Abnormal   Collection Time: 06/18/17  5:43 AM  Result Value Ref Range   Opiates POSITIVE (A) NONE DETECTED   Cocaine NONE DETECTED NONE DETECTED   Benzodiazepines NONE DETECTED NONE DETECTED   Amphetamines NONE DETECTED NONE DETECTED   Tetrahydrocannabinol NONE DETECTED NONE DETECTED   Barbiturates NONE DETECTED NONE DETECTED    Comment: (NOTE) DRUG SCREEN FOR MEDICAL PURPOSES ONLY.  IF CONFIRMATION IS NEEDED FOR ANY PURPOSE, NOTIFY LAB WITHIN 5 DAYS. LOWEST DETECTABLE LIMITS FOR URINE DRUG SCREEN Drug Class                     Cutoff (ng/mL) Amphetamine and metabolites    1000 Barbiturate and metabolites    200 Benzodiazepine                 283 Tricyclics and metabolites     300 Opiates and metabolites        300 Cocaine and metabolites        300 THC                            50 Performed at [name], [place] [address], [place], [place] [address]   CBG monitoring, ED     Status: Abnormal   Collection Time: 06/18/17  6:34 AM  Result Value Ref Range   Glucose-Capillary 126 (H) 65 - 99 mg/dL   Comment 1 Notify RN  Comment 2 Document in Chart   CBG monitoring, ED     Status: None   Collection Time: 06/18/17  7:36 AM  Result Value Ref Range   Glucose-Capillary 85 65 - 99 mg/dL  CBG monitoring, ED     Status: None   Collection Time: 06/18/17  8:42 AM  Result Value Ref Range   Glucose-Capillary 86 65 - 99 mg/dL  HIV antibody (Routine Testing)     Status: None   Collection Time: 06/18/17  9:27 AM  Result Value Ref Range   HIV Screen 4th Generation wRfx Non Reactive Non Reactive    Comment: (NOTE) Performed At: [facility] [address] [place], [place] [id number] Rush Farmer MD [id number] Performed at [facility], [place] [address],  [place], [place] [address]   Magnesium     Status: Abnormal   Collection Time: 06/18/17  9:27 AM  Result Value Ref Range   Magnesium 1.5 (L) 1.7 - 2.4 mg/dL    Comment: Performed at [facility] [address] [place], [place] [address]  Phosphorus     Status: Abnormal   Collection Time: 06/18/17  9:27 AM  Result Value Ref Range   Phosphorus 5.2 (H) 2.5 - 4.6 mg/dL    Comment: Performed at [place] [address] [name], [place] [address]  TSH     Status: None   Collection Time: 06/18/17  9:27 AM  Result Value Ref Range   TSH 1.980 0.350 - 4.500 uIU/mL    Comment: Performed by a 3rd Generation assay with a functional sensitivity of <=0.01 uIU/mL. Performed at [facility], [place] [address], [place], [place] [address]   Comprehensive metabolic panel     Status: Abnormal   Collection Time: 06/18/17  9:27 AM  Result Value Ref Range   Sodium 130 (L) 135 - 145 mmol/L   Potassium 3.6 3.5 - 5.1 mmol/L   Chloride 106 101 - 111 mmol/L   CO2 12 (L) 22 - 32 mmol/L   Glucose, Bld 81 65 - 99 mg/dL   BUN 67 (H) 6 - 20 mg/dL   Creatinine, Ser 4.50 (H) 0.61 - 1.24 mg/dL   Calcium 6.2 (LL) 8.9 - 10.3 mg/dL    Comment: REPEATED TO VERIFY CRITICAL RESULT CALLED TO, READ BACK BY AND VERIFIED WITH: H.BOWMAN,RN 1049 06/18/17 CLARK,S    Total Protein 4.8 (L) 6.5 - 8.1 g/dL   Albumin 1.8 (L) 3.5 - 5.0 g/dL   AST 69 (H) 15 - 41 U/L   ALT 21 17 - 63 U/L   Alkaline Phosphatase 45 38 - 126 U/L   Total Bilirubin 2.2 (H) 0.3 - 1.2 mg/dL   GFR calc non Af Amer 13 (L) >60 mL/min   GFR calc Af Amer 15 (L) >60 mL/min    Comment: (NOTE) The eGFR has been calculated using the CKD EPI equation. This calculation has not been validated in all clinical situations. eGFR's persistently <60 mL/min signify possible Chronic Kidney Disease.    Anion gap 12 5 - 15    Comment: Performed at [facility] [address] [place], [place] [address]  CBC     Status: Abnormal   Collection  Time: 06/18/17  9:27 AM  Result Value Ref Range   WBC 5.2 4.0 - 10.5 K/uL   RBC 2.58 (L) 4.22 - 5.81 MIL/uL   Hemoglobin 8.5 (L) 13.0 - 17.0 g/dL    Comment: REPEATED TO VERIFY  HCT 23.7 (L) 39.0 - 52.0 %   MCV 91.9 78.0 - 100.0 fL   MCH 32.9 26.0 - 34.0 pg   MCHC 35.9 30.0 - 36.0 g/dL   RDW 12.7 11.5 - 15.5 %   Platelets 43 (L) 150 - 400 K/uL    Comment: PLATELET COUNT CONFIRMED BY SMEAR Performed at [facility] [address] [place], [place] [address]   Lactic acid, plasma     Status: Abnormal   Collection Time: 06/18/17  9:27 AM  Result Value Ref Range   Lactic Acid, Venous 2.7 (HH) 0.5 - 1.9 mmol/L    Comment: CRITICAL RESULT CALLED TO, READ BACK BY AND VERIFIED WITH: H.BOWMAN,RN 1049 06/18/17 CLARK,S Performed at [facility], [place] [address] [place], [place] [address]   Procalcitonin     Status: None   Collection Time: 06/18/17  9:27 AM  Result Value Ref Range   Procalcitonin 20.28 ng/mL    Comment:        Interpretation: PCT >= 10 ng/mL: Important systemic inflammatory response, almost exclusively due to severe bacterial sepsis or septic shock. (NOTE)       Sepsis PCT Algorithm           Lower Respiratory Tract                                      Infection PCT Algorithm    ----------------------------     ----------------------------         PCT < 0.25 ng/mL                PCT < 0.10 ng/mL         Strongly encourage             Strongly discourage   discontinuation of antibiotics    initiation of antibiotics    ----------------------------     -----------------------------       PCT 0.25 - 0.50 ng/mL            PCT 0.10 - 0.25 ng/mL               OR       >80% decrease in PCT            Discourage initiation of                                            antibiotics      Encourage discontinuation           of antibiotics    ----------------------------     -----------------------------         PCT >= 0.50 ng/mL              PCT 0.26 - 0.50  ng/mL                AND       <80% decrease in PCT             Encourage initiation of                                             antibiotics  Encourage continuation           of antibiotics    ----------------------------     -----------------------------        PCT >= 0.50 ng/mL                  PCT > 0.50 ng/mL               AND         increase in PCT                  Strongly encourage                                      initiation of antibiotics    Strongly encourage escalation           of antibiotics                                     -----------------------------                                           PCT <= 0.25 ng/mL                                                 OR                                        > 80% decrease in PCT                                     Discontinue / Do not initiate                                             antibiotics Performed at [facility], [address] [place], [place] [address]   CBG monitoring, ED     Status: None   Collection Time: 06/18/17  9:34 AM  Result Value Ref Range   Glucose-Capillary 84 65 - 99 mg/dL   Comment 1 Notify RN    Comment 2 Document in Chart   Lactic acid, plasma     Status: Abnormal   Collection Time: 06/18/17 11:26 AM  Result Value Ref Range   Lactic Acid, Venous 2.6 (HH) 0.5 - 1.9 mmol/L    Comment: CRITICAL RESULT CALLED TO, READ BACK BY AND VERIFIED WITH: S.MARSHALL,RN 1225 06/18/17 CLARK,S Performed at [facility], [place] [address], [place], [place] [address]   CBG monitoring, ED     Status: None   Collection Time: 06/18/17  2:01 PM  Result Value Ref Range   Glucose-Capillary 68 65 - 99 mg/dL     ROS:  Review of systems not obtained due to patient factors.  He does say that everything feels better  Physical Exam: Vitals:   06/18/17 1426 06/18/17 1430  BP:  (!) 79/56  Pulse: 97 (!) 109  Resp: (!) 21 (!) 21  Temp:    SpO2: 100% 98%     General: Thin, chronically  ill appearing, somnolent but arousable with much stimulation  HEENT: pupils are equally round and reactive to light, external motions are intact, mucous membranes moist Neck: No JVD Heart: Tachycardic Lungs: Poor effort, decreased breath sounds at bases Abdomen: Distended, likely ascites, nonpainful Extremities: Left extremity without edema.  Right extremity is erythematous from the knee down as well as edematous Skin: Warm and dry Neuro: Somnolent.  Arousable to stimulation  Assessment/Plan: 62 year old black male with cirrhosis and CAD.  Now presents with hypotension and acute kidney injury 1.Renal-acute kidney injury.  Creatinine in late September 2018 was 1.15.  Fortunately, renal function has improved overnight.  Was on Aldactone and Lasix as an outpatient which he was likely taking when he was not eating so thought is that he was volume depleted.  Has been given volume resuscitation.  There are no indications for dialysis at this time.  Urinalysis is bland and he is not oliguric at this time.  Also of note, patient is a marginal candidate at best for dialysis 2. Hypertension/volume  -thought to have volume depletion.  It has been given volume and does not have evidence of volume overload at this time.  Continue gentle hydration.  Blood pressure still quite low.  Giving antibiotics for sepsis.  CCM is now on the case-plan to place arterial line 3.  Cirrhosis-seems to be fairly advanced, albumin 1.8- cannot rule out GIB at this time.  Will be complicating factor in his management 4. Anemia  -hemoglobin in the eights.  Apparently has chronic anemia but I see hemoglobin in the tens.  Supportive care for now   [place] A 06/18/2017, 2:45 PM

## 2017-06-18 NOTE — ED Notes (Signed)
Katherine schorr np gave orders for dextrose 50% iv and fractiional d5 0.9 % drip

## 2017-06-18 NOTE — ED Notes (Signed)
Right lower extremity appear to have more swelling than left lower extremity. Patient's wife reports that his leg swells when is dose not take his medicines.

## 2017-06-18 NOTE — ED Notes (Signed)
Patient given orange juice

## 2017-06-18 NOTE — ED Notes (Signed)
bair hygger placed lon the pt for low temp

## 2017-06-18 NOTE — ED Notes (Signed)
Dr. Cruzita Lederer paged @ [phone] to [id number]-[id number] [place]ley-called by Levada Dy

## 2017-06-19 DIAGNOSIS — G9341 Metabolic encephalopathy: Secondary | ICD-10-CM

## 2017-06-19 DIAGNOSIS — R6521 Severe sepsis with septic shock: Secondary | ICD-10-CM

## 2017-06-19 DIAGNOSIS — A419 Sepsis, unspecified organism: Secondary | ICD-10-CM

## 2017-06-19 LAB — LACTIC ACID, PLASMA: Lactic Acid, Venous: 1.9 mmol/L (ref 0.5–1.9)

## 2017-06-19 LAB — GLUCOSE, CAPILLARY
Glucose-Capillary: 49 mg/dL — ABNORMAL LOW (ref 65–99)
Glucose-Capillary: 56 mg/dL — ABNORMAL LOW (ref 65–99)
Glucose-Capillary: 67 mg/dL (ref 65–99)
Glucose-Capillary: 68 mg/dL (ref 65–99)
Glucose-Capillary: 75 mg/dL (ref 65–99)
Glucose-Capillary: 77 mg/dL (ref 65–99)
Glucose-Capillary: 83 mg/dL (ref 65–99)
Glucose-Capillary: 87 mg/dL (ref 65–99)
Glucose-Capillary: 90 mg/dL (ref 65–99)

## 2017-06-19 LAB — GASTROINTESTINAL PANEL BY PCR, STOOL (REPLACES STOOL CULTURE)

## 2017-06-19 LAB — MRSA PCR SCREENING: MRSA by PCR: POSITIVE — AB

## 2017-06-19 LAB — BASIC METABOLIC PANEL
Anion gap: 10 (ref 5–15)
BUN: 63 mg/dL — ABNORMAL HIGH (ref 6–20)
CO2: 13 mmol/L — ABNORMAL LOW (ref 22–32)
Calcium: 6.1 mg/dL — CL (ref 8.9–10.3)
Chloride: 111 mmol/L (ref 101–111)
Creatinine, Ser: 3.35 mg/dL — ABNORMAL HIGH (ref 0.61–1.24)
GFR calc Af Amer: 21 mL/min — ABNORMAL LOW (ref >60)
GFR calc non Af Amer: 18 mL/min — ABNORMAL LOW (ref >60)
Glucose, Bld: 59 mg/dL — ABNORMAL LOW (ref 65–99)
Potassium: 4 mmol/L (ref 3.5–5.1)
Sodium: 134 mmol/L — ABNORMAL LOW (ref 135–145)

## 2017-06-19 LAB — URINE CULTURE: Culture: NO GROWTH

## 2017-06-19 LAB — CBC
HCT: 25.2 % — ABNORMAL LOW (ref 39.0–52.0)
Hemoglobin: 9.2 g/dL — ABNORMAL LOW (ref 13.0–17.0)
MCH: 33.2 pg (ref 26.0–34.0)
MCHC: 36.5 g/dL — ABNORMAL HIGH (ref 30.0–36.0)
MCV: 91 fL (ref 78.0–100.0)
Platelets: 50 10*3/uL — ABNORMAL LOW (ref 150–400)
RBC: 2.77 MIL/uL — ABNORMAL LOW (ref 4.22–5.81)
RDW: 13 % (ref 11.5–15.5)
WBC: 14.9 10*3/uL — ABNORMAL HIGH (ref 4.0–10.5)

## 2017-06-19 MED ORDER — CHLORHEXIDINE GLUCONATE CLOTH 2 % EX PADS
6.0000 | MEDICATED_PAD | Freq: Every day | CUTANEOUS | Status: AC
  Administered 2017-06-19 – 2017-06-22 (×4): 6 via TOPICAL

## 2017-06-19 MED ORDER — PENICILLIN G POT IN DEXTROSE 60000 UNIT/ML IV SOLN
3.0000 10*6.[IU] | INTRAVENOUS | Status: DC
  Administered 2017-06-20 – 2017-06-23 (×22): 3 10*6.[IU] via INTRAVENOUS
  Filled 2017-06-19: qty 0
  Filled 2017-06-19: qty 50
  Filled 2017-06-19: qty 0
  Filled 2017-06-19 (×2): qty 50
  Filled 2017-06-19: qty 0
  Filled 2017-06-19: qty 50
  Filled 2017-06-19: qty 0
  Filled 2017-06-19 (×2): qty 50
  Filled 2017-06-19: qty 0
  Filled 2017-06-19 (×2): qty 50
  Filled 2017-06-19: qty 0
  Filled 2017-06-19 (×2): qty 50
  Filled 2017-06-19: qty 0
  Filled 2017-06-19 (×3): qty 50
  Filled 2017-06-19 (×4): qty 0
  Filled 2017-06-19 (×2): qty 50

## 2017-06-19 MED ORDER — FOLIC ACID 1 MG PO TABS
1.0000 mg | ORAL_TABLET | Freq: Every day | ORAL | Status: DC
  Administered 2017-06-19 – 2017-06-27 (×7): 1 mg via ORAL
  Filled 2017-06-19 (×8): qty 1

## 2017-06-19 MED ORDER — MUPIROCIN 2 % EX OINT
1.0000 "application " | TOPICAL_OINTMENT | Freq: Two times a day (BID) | CUTANEOUS | Status: AC
  Administered 2017-06-19 – 2017-06-23 (×10): 1 via NASAL
  Filled 2017-06-19 (×4): qty 22

## 2017-06-19 MED ORDER — PENICILLIN G POT IN DEXTROSE 60000 UNIT/ML IV SOLN
3.0000 10*6.[IU] | INTRAVENOUS | Status: AC
  Administered 2017-06-19 (×2): 3 10*6.[IU] via INTRAVENOUS
  Filled 2017-06-19 (×9): qty 50

## 2017-06-19 MED ORDER — SODIUM CHLORIDE 0.9 % IV SOLN
1.0000 g | Freq: Once | INTRAVENOUS | Status: AC
  Administered 2017-06-19: 1 g via INTRAVENOUS
  Filled 2017-06-19: qty 10

## 2017-06-19 MED ORDER — LACTULOSE 10 GM/15ML PO SOLN
20.0000 g | Freq: Every day | ORAL | Status: DC
  Administered 2017-06-19 – 2017-06-20 (×2): 20 g via ORAL
  Filled 2017-06-19 (×3): qty 30

## 2017-06-19 MED ORDER — VITAMIN B-1 100 MG PO TABS
100.0000 mg | ORAL_TABLET | Freq: Every day | ORAL | Status: DC
  Administered 2017-06-19 – 2017-06-27 (×7): 100 mg via ORAL
  Filled 2017-06-19 (×8): qty 1

## 2017-06-19 NOTE — Progress Notes (Signed)
Independently examined pt, evaluated data & formulated above care plan with NP/resident   62 year old man with alcoholic cirrhosis, substance abuse, admitted with both lower extremity swelling and pain.  Noted to be hypotensive for an extended.  With lactate of 2.6 which is improved with fluids, did not require pressors He also had worsening renal failure  He appears improved this morning, awake and interactive, has asterixis, mild pallor and muddy sclera, soft nontender abdomen, erythema both lower extremities up to his thighs with mild tenderness, no skin lesions, right leg appears more swollen than left.  Imaging including CT abdomen and renal ultrasound reviewed. Labs show improving creatinine, lactate cleared B CID showing Streptococcus  Impression/plan Septic shock due to streptococcal cellulitis, no evidence of necrotizing fasciitis -continue penicillin with added clindamycin for eagle effect  AKI -improving with fluids, continue at lower rate. He has been placed on isolation precautions due to 2 episodes of loose stools, this is likely related to his lactulose, we will discontinue, doubt C. difficile here.  Alcoholic cirrhosis-last alcohol intake was Friday per patient, monitor for withdrawal, decrease lactulose to once daily  Transferred to telemetry and to triad 2/23  The patient is critically ill with multiple organ systems failure and requires high complexity decision making for assessment and support, frequent evaluation and titration of therapies, application of advanced monitoring technologies and extensive interpretation of multiple databases. Critical Care Time devoted to patient care services described in this note independent of APP/resident  time is 32 minutes.   Leanna Sato Elsworth Soho MD

## 2017-06-19 NOTE — Progress Notes (Signed)
PULMONARY / CRITICAL CARE MEDICINE   Name: Mark [name] MRN: [id number] DOB: [birth year]    ADMISSION DATE:  06/17/2017 CONSULTATION DATE: 06/18/2017  REFERRING MD: [facility]  CHIEF COMPLAINT: Right Leg pain  HISTORY OF PRESENT ILLNESS:  62 year old male with history of alcoholic cirrhosis, alcohol abuse, he likes to snort heroin on a regular basis, he has chronic anemia chronic hypotension history of coronary artery disease with an MI. He reports being his usual state of health until previous Friday which time he notes swelling of his right lower extremity and was painful.  For the pain he snorted cocaine prior to being admitted to [facility]. Is been fluid resuscitated for elevated pro-calcitonin for a right lower extremity cellulitis treated with antimicrobial therapy.  He does have alcoholic cirrhosis and he does have abdominal distention. He is awake alert in no acute distress.  He is able to carry on conversations without problem.  Fortunately his blood pressure remains low despite fluid resuscitation as documented 72/52.  Suspect his continuing to take diuretics while he was having nausea vomiting diarrhea may have contributed to his acute renal failure. Due to his refractory hypotension, right lower extremity cellulitis and the fact he has cirrhosis from substance abuse he will be admitted to the ICU for further evaluation and treatment.   PAST MEDICAL HISTORY :  He  has a past medical history of Alcohol abuse, Anemia, Arthritis, Ascites, Cirrhosis ([place]), Heart murmur, Hyperlipidemia, Hypertension, Leg swelling, Myocardial infarction ([place]) (2012), Stroke ([place]) (2012), and Thrombocytopenia ([place]).  PAST SURGICAL HISTORY: He  has a past surgical history that includes Knee arthroscopy; Hernia repair (Right); Total knee arthroplasty (Right, 05/02/2013); Colonoscopy with propofol (N/A, 02/07/2016); and Esophagogastroduodenoscopy (egd) with propofol (N/A, 02/07/2016).  No Known  Allergies  No current facility-administered medications on file prior to encounter.    Current Outpatient Medications on File Prior to Encounter  Medication Sig  . furosemide (LASIX) 20 MG tablet Take 1 tablet (20 mg total) by mouth 2 (two) times daily.  . nadolol (CORGARD) 20 MG tablet Take 1 tablet (20 mg total) by mouth daily.  Marland Kitchen spironolactone (ALDACTONE) 50 MG tablet Take 2 tablets (100 mg total) by mouth 2 (two) times daily.    FAMILY HISTORY:  His indicated that his mother is deceased. He indicated that his father is deceased. He indicated that all of his four sisters are alive. He indicated that all of his four brothers are alive. He indicated that his maternal grandmother is deceased. He indicated that his maternal grandfather is deceased. He indicated that his paternal grandmother is deceased. He indicated that his paternal grandfather is deceased.   SOCIAL HISTORY: He  reports that he quit smoking about 17 months ago. His smoking use included cigarettes. He has a 5.00 pack-year smoking history. he has never used smokeless tobacco. He reports that he uses drugs. Drugs: Marijuana and Heroin. He reports that he does not drink alcohol.  REVIEW OF SYSTEMS:   10 point review of system taken, please see HPI for positives and negatives.    SUBJECTIVE:  No acute events overnight. Patient continue to complain of pain in right leg. No appetite. Mentating well.  VITAL SIGNS: BP (!) 103/58   Pulse 82   Temp (!) 97.4 F (36.3 C) (Oral)   Resp (!) 22   Ht 5' 6.5" (1.689 m)   Wt 162 lb 14.7 oz (73.9 kg)   SpO2 100%   BMI 25.90 kg/m   HEMODYNAMICS:    VENTILATOR  SETTINGS:    INTAKE / OUTPUT: I/O last 3 completed shifts: In: 4113.3 [I.V.:2663.3; IV Piggyback:1450] Out: 2300 [Urine:2300]  PHYSICAL EXAMINATION: General: Appears older than stated age, awake alert able to carry on conversations Neuro: Grossly intact, asterixis noted on exam  HEENT: No JVD lymphadenopathy  appreciated, Mild scleral icterus  Cardiovascular: Heart sounds are regular regular rate and rhythm Lungs: Mild rhonchi Abdomen: Distended no hepatomegaly Musculoskeletal: Right lower extremity swollen with erythremia right total knee replacement scar noted Skin: Right leg hot to touch  LABS:  BMET Recent Labs  Lab 06/17/17 1525 06/18/17 0927 06/19/17 0320  NA 122* 130* 134*  K 5.2* 3.6 4.0  CL 93* 106 111  CO2 17* 12* 13*  BUN 76* 67* 63*  CREATININE 6.48* 4.50* 3.35*  GLUCOSE 55* 81 59*    Electrolytes Recent Labs  Lab 06/17/17 1525 06/17/17 2024 06/18/17 0927 06/19/17 0320  CALCIUM 7.5*  --  6.2* 6.1*  MG  --  1.1* 1.5*  --   PHOS  --  6.1* 5.2*  --     CBC Recent Labs  Lab 06/17/17 1525 06/18/17 0927 06/19/17 0320  WBC 4.7 5.2 14.9*  HGB 10.4* 8.5* 9.2*  HCT 28.9* 23.7* 25.2*  PLT 63* 43* 50*    Coag's Recent Labs  Lab 06/17/17 2024  INR 1.38    Sepsis Markers Recent Labs  Lab 06/18/17 0927 06/18/17 1126 06/19/17 0320  LATICACIDVEN 2.7* 2.6* 1.9  PROCALCITON 20.28  --   --     ABG No results for input(s): PHART, PCO2ART, PO2ART in the last 168 hours.  Liver Enzymes Recent Labs  Lab 06/17/17 1525 06/18/17 0927  AST 72* 69*  ALT 24 21  ALKPHOS 63 45  BILITOT 2.1* 2.2*  ALBUMIN 2.3* 1.8*    Cardiac Enzymes No results for input(s): TROPONINI, PROBNP in the last 168 hours.  Glucose Recent Labs  Lab 06/18/17 2331 06/19/17 0012 06/19/17 0416 06/19/17 0458 06/19/17 0520 06/19/17 0718  GLUCAP 56* 83 49* 68 90 77    Imaging Ct Abdomen Pelvis Wo Contrast  Result Date: 06/17/2017 CLINICAL DATA:  Abdominal pain, nausea and vomiting, and diarrhea for 4 days. Alcoholic cirrhosis. Chronic kidney disease stage 3. EXAM: CT ABDOMEN AND PELVIS WITHOUT CONTRAST TECHNIQUE: Multidetector CT imaging of the abdomen and pelvis was performed following the standard protocol without IV contrast. COMPARISON:  Ultrasound on 01/23/2017 FINDINGS:  Lower chest: No acute findings. Hepatobiliary: No mass visualized on this unenhanced exam. Hepatic cirrhosis demonstrated. Multiple tiny gallstones are seen. Gallbladder is nondilated and mild diffuse gallbladder wall thickening appears stable compared to previous ultrasound. No evidence of acute pericholecystic inflammatory changes or fluid collections. No evidence of biliary ductal dilatation. Pancreas: No mass or inflammatory process visualized on this unenhanced exam. Spleen:  Within normal limits in size. Adrenals/Urinary tract: No evidence of urolithiasis or hydronephrosis. Small fluid attenuation cyst noted in lower pole of left kidney. Unremarkable unopacified urinary bladder. Stomach/Bowel: No evidence of obstruction, inflammatory process, or abnormal fluid collections. Normal appendix visualized. Vascular/Lymphatic: No pathologically enlarged lymph nodes identified. No evidence of abdominal aortic aneurysm. Aortic atherosclerosis. Esophageal varices, consistent with portal venous hypertension. Mild mesenteric and retroperitoneal edema, without evidence of ascites, also likely secondary to cirrhosis. Reproductive:  No mass or other significant abnormality. Other:  None. Musculoskeletal:  No suspicious bone lesions identified. IMPRESSION: Hepatic cirrhosis, and findings of portal venous hypertension including esophageal varices. Mild mesenteric and retroperitoneal edema, likely secondary to cirrhosis/portal venous hypertension. No evidence of ascites  or focal inflammatory process. Cholelithiasis.  No radiographic evidence of acute cholecystitis. Electronically Signed   By: Earle Gell M.D.   On: 06/17/2017 18:06   Dg Chest 2 View  Result Date: 06/17/2017 CLINICAL DATA:  62 year old male with history of sepsis. No chest pain or shortness of breath. EXAM: CHEST  2 VIEW COMPARISON:  Chest x-ray 04/26/2013. FINDINGS: Lung volumes are low. No consolidative airspace disease. No pleural effusions. No  pneumothorax. No pulmonary nodule or mass noted. Pulmonary vasculature and the cardiomediastinal silhouette are within normal limits. Old healed fracture in the lateral aspect of the left seventh rib. IMPRESSION: 1. Low lung volumes without radiographic evidence of acute cardiopulmonary disease. Electronically Signed   By: Vinnie Langton M.D.   On: 06/17/2017 21:04   US Renal  Result Date: 06/17/2017 CLINICAL DATA:  Renal failure. EXAM: RENAL / URINARY TRACT ULTRASOUND COMPLETE COMPARISON:  Noncontrast CT earlier this day. Abdominal ultrasound 01/23/2017 FINDINGS: Right Kidney: Length: 10.5 cm. Echogenicity within normal limits. No mass or hydronephrosis visualized. Left Kidney: Length: 11.0 cm. Echogenicity within normal limits. Simple cyst in the lower pole measures 2.7 cm. No solid mass or hydronephrosis visualized. Bladder: Appears normal for degree of bladder distention. Incidental note of distended gallbladder containing stones and sludge. IMPRESSION: No obstructive uropathy. Left renal cyst, otherwise unremarkable sonographic appearance of both kidneys. Electronically Signed   By: Jeb Levering M.D.   On: 06/17/2017 22:56    STUDIES:  See above  CULTURES: Blood cx 2/20 >>Strep Pyogenes Urine cx 2/20 >> NGTD  ANTIBIOTICS: 06/18/2017 Cleocin>> 06/18/2017 penicillin G>> 06/18/2017 vancomycin>>  SIGNIFICANT EVENTS: Admitted ICU 2/21  LINES/TUBES: PIVx2  DISCUSSION: 62 year old male with history of alcoholic cirrhosis, alcohol abuse, he likes to snort heroin on a regular basis, he has chronic anemia chronic hypotension history of coronary artery disease with an MI. He reports being his usual state of health until previous Friday which time he notes swelling of his right lower extremity and was painful.  For the pain he snorted cocaine prior to being admitted to [facility]. Is been fluid resuscitated for elevated pro-calcitonin for a right lower extremity cellulitis treated  with antimicrobial therapy.  He does have alcoholic cirrhosis and he does have abdominal distention. He is awake alert in no acute distress.  He is able to carry on conversations without problem.  Fortunately his blood pressure remains low despite fluid resuscitation as documented 72/52.  Suspect his continuing to take diuretics while he was having nausea vomiting diarrhea may have contributed to his acute renal failure. Due to his refractory hypotension, right lower extremity cellulitis and the fact he has cirrhosis from substance abuse he will be admitted to the ICU for further evaluation and treatment.   ASSESSMENT / PLAN:  PULMONARY A: Tobacco abuse Heroin abuse P:   O2 as needed  CARDIOVASCULAR A:  Chronic hypotension-On albumin, no pressors, improving with IVF Likely septic shock- supported by PCT 20.20 and elevated lactic acid and severe renal failure   P:  Fluid resuscitation as needed Hold diuretics Need to place arterial line to get accurate blood pressures  RENAL  A:   Acute on chronic renal failure-Improving Creat 6.48>4.50>3.35 Continuation of diuretics while having nausea vomiting and diarrhea. Continued substance abuse and alcohol abuse Lactic acidosis-Downtrending  P:   Hydration Follow creatinine  GASTROINTESTINAL A:   History of nausea Recent diarrhea Alcoholic cirrhosis-asterixis, on lactulose and albumin   P:   No recent CT of abdomen Antiemetics as needed No  need for paracentesis at this time. Consider albumin for fluid resuscitation due to his history of cirrhosis  INFECTIOUS A:   R LE cellulitis. On PCN and clindamycin. Down trending WBCs   P:   Continue current antibiotic regimen as above Follow CBC Fever curve  HEMATOLOGIC A:   Anemia-Likely chronic in the setting of alcohol cirrhosis and worsening kidney function  P:  Follow up on am CBC    ENDOCRINE A:   Hypoglycemic in the setting of poor po intake  P:   Continue D5NS  prn CBG q4  NEUROLOGIC A:  Chronic polysubstance abuse History of EtOH abuse now drinks approximately 40 ounces of beer daily- Last drink per patient last Friday History of prior CVA Some asterixis noted but otherwise AOx4.  P:   CIWA protocol Continue folate and thiamine Multivitamins  DISPO: Will transfer to Telemetry, Hospitalist group will assume care on 2/23  FAMILY  - Updates: Patient updated  - Inter-disciplinary family meet or Palliative Care meeting due by:  day [place] PGY-2, [facility]  06/19/2017 8:23 AM

## 2017-06-19 NOTE — Progress Notes (Signed)
PHARMACY NOTE:  ANTIMICROBIAL RENAL DOSAGE ADJUSTMENT  Current antimicrobial regimen includes a mismatch between antimicrobial dosage and estimated renal function.  As per policy approved by the Pharmacy & Therapeutics and Medical Executive Committees, the antimicrobial dosage will be adjusted accordingly.  Current antimicrobial dosage:  Penicillin G 3 Million Units every 8 horus  Indication: Group A Strep Bacteremia  Renal Function:  Estimated Creatinine Clearance: 20.8 mL/min (A) (by C-G formula based on SCr of 3.35 mg/dL (H)). []      On intermittent HD, scheduled: []      On CRRT    Antimicrobial dosage has been changed to:  Penicillin G 3 Million Units every 4 hours  Additional comments:   Thank you for allowing pharmacy to be a part of this patient's care.  Lawson Radar, [facility] [place] 06/19/2017 1:32 PM

## 2017-06-19 NOTE — Progress Notes (Signed)
CRITICAL VALUE ALERT  Critical Value:  Calcium 6.1  Date & Time Notied:  06/19/2017 0445  Provider Notified: Warren Lacy MD  Orders Received/Actions taken: see [facility]

## 2017-06-19 NOTE — Progress Notes (Signed)
Pt having multiple episodes of hypoglycemia throughout the night. Notified Elink MD. New orders received. Will continue to monitor.

## 2017-06-19 NOTE — Progress Notes (Signed)
Admit: 06/17/2017 LOS: 2  66M with cellulits in leg and Strep pyogenes bacteremia on PCN w/ AKI related to hypovolemia + diuretics + NSAIDs  Subjective:  Further improved renal function with hydration Good UOP   02/21 0701 - 02/22 0700 In: 4113.3 [I.V.:2663.3; IV Piggyback:1450] Out: 2300 [Urine:2300]  Filed Weights   06/18/17 0700 06/18/17 2012  Weight: 68 kg (150 lb) 73.9 kg (162 lb 14.7 oz)    Scheduled Meds: . Chlorhexidine Gluconate Cloth  6 each Topical Q0600  . folic acid  1 mg Oral Daily  . lactulose  20 g Oral TID  . multivitamin with minerals  1 tablet Oral Once  . mupirocin ointment  1 application Nasal BID  . thiamine  100 mg Oral Daily   Continuous Infusions: . clindamycin (CLEOCIN) IV Stopped (06/19/17 0600)  . dextrose 5 % and 0.9% NaCl 100 mL/hr at 06/19/17 0443  . pencillin G potassium IV Stopped (06/19/17 0532)   PRN Meds:.acetaminophen **OR** acetaminophen, fentaNYL (SUBLIMAZE) injection, LORazepam, ondansetron **OR** ondansetron (ZOFRAN) IV  Current Labs: reviewed    Physical Exam:  Blood pressure (!) 103/58, pulse 82, temperature (!) 97.4 F (36.3 C), temperature source Oral, resp. rate (!) 22, height 5' 6.5" (1.689 m), weight 73.9 kg (162 lb 14.7 oz), SpO2 100 %. NAD RLE swollen with some erythema; LLE not swollen RRR Coarse BS bl  A 1. AKI 2/2 hypovolemia (poor PO, N/V, Diuretics); improving 2. Celluitis with Strep pyogenes bacteremia on PCN per CCM 3. CIrrhosis 4. Anemia 5. Hypotension, improving  P 1. Recovering renal function 2. Cont hydration 3. No further suggestions 4. Will sign off for now.  Please call with any questions or concerns.  Pt does not  need follow up with nephrology unless sustained low GFR    Pearson Grippe MD 06/19/2017, 9:24 AM  Recent Labs  Lab 06/17/17 1525 06/17/17 2024 06/18/17 0927 06/19/17 0320  NA 122*  --  130* 134*  K 5.2*  --  3.6 4.0  CL 93*  --  106 111  CO2 17*  --  12* 13*  GLUCOSE 55*  --   81 59*  BUN 76*  --  67* 63*  CREATININE 6.48*  --  4.50* 3.35*  CALCIUM 7.5*  --  6.2* 6.1*  PHOS  --  6.1* 5.2*  --    Recent Labs  Lab 06/17/17 1525 06/18/17 0927 06/19/17 0320  WBC 4.7 5.2 14.9*  HGB 10.4* 8.5* 9.2*  HCT 28.9* 23.7* 25.2*  MCV 93.5 91.9 91.0  PLT 63* 43* 50*

## 2017-06-20 LAB — GLUCOSE, CAPILLARY
Glucose-Capillary: 97 mg/dL (ref 65–99)
Glucose-Capillary: 92 mg/dL (ref 65–99)
Glucose-Capillary: 80 mg/dL (ref 65–99)
Glucose-Capillary: 116 mg/dL — ABNORMAL HIGH (ref 65–99)
Glucose-Capillary: 123 mg/dL — ABNORMAL HIGH (ref 65–99)

## 2017-06-20 LAB — BASIC METABOLIC PANEL
Anion gap: 10 (ref 5–15)
BUN: 55 mg/dL — ABNORMAL HIGH (ref 6–20)
CO2: 13 mmol/L — ABNORMAL LOW (ref 22–32)
Calcium: 6.8 mg/dL — ABNORMAL LOW (ref 8.9–10.3)
Chloride: 114 mmol/L — ABNORMAL HIGH (ref 101–111)
Creatinine, Ser: 2.27 mg/dL — ABNORMAL HIGH (ref 0.61–1.24)
GFR calc Af Amer: 34 mL/min — ABNORMAL LOW (ref >60)
GFR calc non Af Amer: 29 mL/min — ABNORMAL LOW (ref >60)
Glucose, Bld: 85 mg/dL (ref 65–99)
Potassium: 3.7 mmol/L (ref 3.5–5.1)
Sodium: 137 mmol/L (ref 135–145)

## 2017-06-20 LAB — CBC
HCT: 23.3 % — ABNORMAL LOW (ref 39.0–52.0)
Hemoglobin: 8.5 g/dL — ABNORMAL LOW (ref 13.0–17.0)
MCH: 33.2 pg (ref 26.0–34.0)
MCHC: 36.5 g/dL — ABNORMAL HIGH (ref 30.0–36.0)
MCV: 91 fL (ref 78.0–100.0)
Platelets: 44 10*3/uL — ABNORMAL LOW (ref 150–400)
RBC: 2.56 MIL/uL — ABNORMAL LOW (ref 4.22–5.81)
RDW: 13.4 % (ref 11.5–15.5)
WBC: 19.5 10*3/uL — ABNORMAL HIGH (ref 4.0–10.5)

## 2017-06-20 LAB — CULTURE, BLOOD (ROUTINE X 2)
Special Requests: ADEQUATE
Special Requests: ADEQUATE

## 2017-06-20 MED ORDER — NADOLOL 20 MG PO TABS
10.0000 mg | ORAL_TABLET | Freq: Every day | ORAL | Status: DC
  Administered 2017-06-20 – 2017-06-23 (×2): 10 mg via ORAL
  Filled 2017-06-20 (×5): qty 1

## 2017-06-20 MED ORDER — DEXTROSE-NACL 5-0.9 % IV SOLN
INTRAVENOUS | Status: AC
  Administered 2017-06-20: 10:00:00 via INTRAVENOUS

## 2017-06-20 NOTE — Progress Notes (Addendum)
PROGRESS NOTE  Mark [name] OBS:[id number] DOB: 01-06-1956 DOA: 06/17/2017 PCP: [name], MD   LOS: 3 days   Brief Narrative / Interim history: This is a 62 year old male with history of alcoholic cirrhosis, ongoing alcohol abuse, history of esophageal varices, chronic anemia, hypertension, coronary artery disease with prior MI, thrombocytopenia, who presents with generalized weakness, nausea vomiting and diarrhea for the past few days.   -Found to have severe sepsis, group A strep bacteremia and hypotension, acute kidney injury was briefly in was briefly in the ICU 48 hours, did not have pressors, -Transferred from critical care to [facility] today 2/23  Assessment & Plan:  Severe sepsis/group A strep bacteremia -Secondary to right lower extremity cellulitis -continue IV penicillin G, completed 48hours of clindamycin will discontinue this now - repeat blood cultures - Once repeat cultures negative to transition to oral antibiotics -Will discuss case with infectious disease later this week, I suspect he will need 14 day course of total antibiotic coverage  Right lower extremity cellulitis -Antibiotics as discussed above  Alcoholic cirrhosis of the liver -Patient being followed as an outpatient by Dr. Ardis Hughs, he unfortunately continues to drink about 1 - 40 ounce beer per day -Continue lactulose -Hold spironolactone, furosemide -Restart nadolol  Acute kidney injury on chronic kidney disease stage II-III -Baseline creatinine around 1.1-1.2, creatinine 6.5 on admission, -Most likely secondary to sepsis and hypotension -Improving with IV fluids, down to 2.2 today, continue fluids for another 6 hours  Hyponatremia -Likely in the setting of dehydration, improved  Thrombocytopenia -Likely in the setting of liver disease, no bleeding, monitor  Iron deficiency anemia -No bleeding, continue to monitor  DVT prophylaxis: SCDs Code Status: Full code Family  Communication: no family at bedside Disposition Plan: May need SNF, will consult PT and OT  Consultants:   PCCM  Nephrology   Procedures:   None   Antimicrobials:  Vancomycin 2/20 >> 2/21  Zosyn 2/20 >> 2/21  Penicillin 2/21 >>  Clindamycin 2/21 >>   Subjective: -And comfortable in bed, wants to lay down on the couch beside the bed, reports pain in his right lower leg, admits drinking alcohol until this hospitalization  Objective: Vitals:   06/19/17 1837 06/19/17 2058 06/20/17 0503 06/20/17 1037  BP: (!) 111/50 (!) 129/56 (!) 111/45 (!) 125/58  Pulse: 80 74 84 77  Resp: 18 20 18 18   Temp: 97.6 F (36.4 C) (!) 97.5 F (36.4 C) 98.5 F (36.9 C) (!) 97.5 F (36.4 C)  TempSrc: Oral Oral Oral Axillary  SpO2: 100% 100% 100% 100%  Weight:  71.9 kg (158 lb 8.2 oz)    Height:        Intake/Output Summary (Last 24 hours) at 06/20/2017 1136 Last data filed at 06/20/2017 1101 Gross per 24 hour  Intake 1721.66 ml  Output 1900 ml  Net -178.34 ml   Filed Weights   06/18/17 0700 06/18/17 2012 06/19/17 2058  Weight: 68 kg (150 lb) 73.9 kg (162 lb 14.7 oz) 71.9 kg (158 lb 8.2 oz)    Examination:  Gen: Chronically ill, disheveled male, lying in bed, no distress, alert awake oriented to self and only partly to place  HEENT, pupils equal and reactive, oral mucosa is moist and pink CVS S1-S2 regular rate rhythm, faint systolic murmur noted Lungs decreased breath sounds at both bases, rest clear Abdomen is soft and nontender, mildly distended, bowel sounds present Extremities right lower leg with erythema and edema and tenderness involving most of the lower  leg up to the level of the :  Knee, left leg is without edema Skin erythema noted on the right lower leg  Neuro moves all extremities, no localizing signs, no asterixis noted today Psych, pleasant    Data Reviewed: I have independently reviewed following labs and imaging studies   CXR - no infiltrates EKG - sinus  rhyhtm  CBC: Recent Labs  Lab 06/17/17 1525 06/18/17 0927 06/19/17 0320 06/20/17 0757  WBC 4.7 5.2 14.9* 19.5*  HGB 10.4* 8.5* 9.2* 8.5*  HCT 28.9* 23.7* 25.2* 23.3*  MCV 93.5 91.9 91.0 91.0  PLT 63* 43* 50* 44*   Basic Metabolic Panel: Recent Labs  Lab 06/17/17 1525 06/17/17 2024 06/18/17 0927 06/19/17 0320 06/20/17 0757  NA 122*  --  130* 134* 137  K 5.2*  --  3.6 4.0 3.7  CL 93*  --  106 111 114*  CO2 17*  --  12* 13* 13*  GLUCOSE 55*  --  81 59* 85  BUN 76*  --  67* 63* 55*  CREATININE 6.48*  --  4.50* 3.35* 2.27*  CALCIUM 7.5*  --  6.2* 6.1* 6.8*  MG  --  1.1* 1.5*  --   --   PHOS  --  6.1* 5.2*  --   --    GFR: Estimated Creatinine Clearance: 30.6 mL/min (A) (by C-G formula based on SCr of 2.27 mg/dL (H)). Liver Function Tests: Recent Labs  Lab 06/17/17 1525 06/18/17 0927  AST 72* 69*  ALT 24 21  ALKPHOS 63 45  BILITOT 2.1* 2.2*  PROT 6.3* 4.8*  ALBUMIN 2.3* 1.8*   Recent Labs  Lab 06/17/17 1525  LIPASE 23   Recent Labs  Lab 06/17/17 2124  AMMONIA 114*   Coagulation Profile: Recent Labs  Lab 06/17/17 2024  INR 1.38   Cardiac Enzymes: Recent Labs  Lab 06/17/17 2024  CKTOTAL 75   BNP (last 3 results) No results for input(s): PROBNP in the last 8760 hours. HbA1C: No results for input(s): HGBA1C in the last 72 hours. CBG: Recent Labs  Lab 06/19/17 1514 06/19/17 1612 06/19/17 2136 06/20/17 0459 06/20/17 0817  GLUCAP 56* 75 87 97 92   Lipid Profile: No results for input(s): CHOL, HDL, LDLCALC, TRIG, CHOLHDL, LDLDIRECT in the last 72 hours. Thyroid Function Tests: Recent Labs    06/18/17 0927  TSH 1.980   Anemia Panel: No results for input(s): VITAMINB12, FOLATE, FERRITIN, TIBC, IRON, RETICCTPCT in the last 72 hours. Urine analysis:    Component Value Date/Time   COLORURINE AMBER (A) 06/17/2017 1649   APPEARANCEUR HAZY (A) 06/17/2017 1649   LABSPEC 1.013 06/17/2017 1649   PHURINE 5.0 06/17/2017 1649   GLUCOSEU  NEGATIVE 06/17/2017 1649   HGBUR SMALL (A) 06/17/2017 1649   BILIRUBINUR NEGATIVE 06/17/2017 1649   KETONESUR NEGATIVE 06/17/2017 1649   PROTEINUR NEGATIVE 06/17/2017 1649   UROBILINOGEN 2.0 (H) 04/26/2013 0945   NITRITE NEGATIVE 06/17/2017 1649   LEUKOCYTESUR TRACE (A) 06/17/2017 1649   Sepsis Labs: Invalid input(s): PROCALCITONIN, LACTICIDVEN  Recent Results (from the past 240 hour(s))  Blood culture (routine x 2)     Status: Abnormal (Preliminary result)   Collection Time: 06/17/17  5:09 PM  Result Value Ref Range Status   Specimen Description BLOOD RIGHT ANTECUBITAL  Final   Special Requests   Final    BOTTLES DRAWN AEROBIC AND ANAEROBIC Blood Culture adequate volume   Culture  Setup Time   Final    GRAM POSITIVE COCCI IN BOTH  AEROBIC AND ANAEROBIC BOTTLES CRITICAL RESULT CALLED TO, READ BACK BY AND VERIFIED WITH: Carolann Littler [facility] 06/18/17 0865 JDW Performed at [facility], [address], [place], [place] [address]    Culture GROUP A STREP (S.PYOGENES) ISOLATED (A)  Final   Report Status PENDING  Incomplete   Organism ID, Bacteria GROUP A STREP (S.PYOGENES) ISOLATED  Final      Susceptibility   Group a strep (s.pyogenes) isolated - MIC*    PENICILLIN <=0.06 SENSITIVE Sensitive     CEFTRIAXONE <=0.12 SENSITIVE Sensitive     ERYTHROMYCIN <=0.12 SENSITIVE Sensitive     LEVOFLOXACIN 0.5 SENSITIVE Sensitive     VANCOMYCIN <=0.12 SENSITIVE Sensitive     * GROUP A STREP (S.PYOGENES) ISOLATED  Blood culture (routine x 2)     Status: Abnormal   Collection Time: 06/17/17  5:26 PM  Result Value Ref Range Status   Specimen Description BLOOD LEFT ANTECUBITAL  Final   Special Requests   Final    BOTTLES DRAWN AEROBIC AND ANAEROBIC Blood Culture adequate volume   Culture  Setup Time   Final    GRAM POSITIVE COCCI IN BOTH AEROBIC AND ANAEROBIC BOTTLES CRITICAL RESULT CALLED TO, READ BACK BY AND VERIFIED WITH: K PATTON PHARMD 06/18/17 0735 JDW    Culture (A)  Final    GROUP A  STREP (S.PYOGENES) ISOLATED SUSCEPTIBILITIES PERFORMED ON PREVIOUS CULTURE WITHIN THE LAST 5 DAYS. Performed at [facility], [place] [address] [place], [place] [address]    Report Status 06/20/2017 FINAL  Final  Blood Culture ID Panel (Reflexed)     Status: Abnormal   Collection Time: 06/17/17  5:26 PM  Result Value Ref Range Status   Enterococcus species NOT DETECTED NOT DETECTED Final   Listeria monocytogenes NOT DETECTED NOT DETECTED Final   Staphylococcus species NOT DETECTED NOT DETECTED Final   Staphylococcus aureus NOT DETECTED NOT DETECTED Final   Streptococcus species DETECTED (A) NOT DETECTED Final    Comment: CRITICAL RESULT CALLED TO, READ BACK BY AND VERIFIED WITH: K PATTON PHARMD 06/18/17 0735 JDW    Streptococcus agalactiae NOT DETECTED NOT DETECTED Final   Streptococcus pneumoniae NOT DETECTED NOT DETECTED Final   Streptococcus pyogenes DETECTED (A) NOT DETECTED Final    Comment: CRITICAL RESULT CALLED TO, READ BACK BY AND VERIFIED WITH: K PATTON PHARMD 06/18/17 0735 JDW    Acinetobacter baumannii NOT DETECTED NOT DETECTED Final   Enterobacteriaceae species NOT DETECTED NOT DETECTED Final   Enterobacter cloacae complex NOT DETECTED NOT DETECTED Final   Escherichia coli NOT DETECTED NOT DETECTED Final   Klebsiella oxytoca NOT DETECTED NOT DETECTED Final   Klebsiella pneumoniae NOT DETECTED NOT DETECTED Final   Proteus species NOT DETECTED NOT DETECTED Final   Serratia marcescens NOT DETECTED NOT DETECTED Final   Haemophilus influenzae NOT DETECTED NOT DETECTED Final   Neisseria meningitidis NOT DETECTED NOT DETECTED Final   Pseudomonas aeruginosa NOT DETECTED NOT DETECTED Final   Candida albicans NOT DETECTED NOT DETECTED Final   Candida glabrata NOT DETECTED NOT DETECTED Final   Candida krusei NOT DETECTED NOT DETECTED Final   Candida parapsilosis NOT DETECTED NOT DETECTED Final   Candida tropicalis NOT DETECTED NOT DETECTED Final    Comment: Performed at  [facility], [place] [address] [place], [place] [address]  Urine Culture     Status: None   Collection Time: 06/17/17 10:04 PM  Result Value Ref Range Status   Specimen Description URINE, CLEAN CATCH  Final   Special Requests NONE  Final   Culture   Final    NO GROWTH Performed at [facility], [place] [address], [place], [place] [address]    Report Status 06/19/2017 FINAL  Final  Gastrointestinal Panel by PCR , Stool     Status: None   Collection Time: 06/18/17  6:41 PM  Result Value Ref Range Status   Campylobacter species NOT DETECTED NOT DETECTED Final   Plesimonas shigelloides NOT DETECTED NOT DETECTED Final   Salmonella species NOT DETECTED NOT DETECTED Final   Yersinia enterocolitica NOT DETECTED NOT DETECTED Final   Vibrio species NOT DETECTED NOT DETECTED Final   Vibrio cholerae NOT DETECTED NOT DETECTED Final   Enteroaggregative E coli (EAEC) NOT DETECTED NOT DETECTED Final   Enteropathogenic E coli (EPEC) NOT DETECTED NOT DETECTED Final   Enterotoxigenic E coli (ETEC) NOT DETECTED NOT DETECTED Final   Shiga like toxin producing E coli (STEC) NOT DETECTED NOT DETECTED Final   Shigella/Enteroinvasive E coli (EIEC) NOT DETECTED NOT DETECTED Final   Cryptosporidium NOT DETECTED NOT DETECTED Final   Cyclospora cayetanensis NOT DETECTED NOT DETECTED Final   Entamoeba histolytica NOT DETECTED NOT DETECTED Final   Giardia lamblia NOT DETECTED NOT DETECTED Final   Adenovirus F40/41 NOT DETECTED NOT DETECTED Final   Astrovirus NOT DETECTED NOT DETECTED Final   Norovirus GI/GII NOT DETECTED NOT DETECTED Final   Rotavirus A NOT DETECTED NOT DETECTED Final   Sapovirus (I, II, IV, and V) NOT DETECTED NOT DETECTED Final    Comment: Performed at [facility], [place] [place],  [address]  MRSA PCR Screening     Status: Abnormal   Collection Time: 06/18/17  8:10 PM  Result Value Ref Range Status   MRSA by PCR POSITIVE (A) NEGATIVE Final    Comment:         The GeneXpert MRSA Assay (FDA approved for NASAL specimens only), is one component of a comprehensive MRSA colonization surveillance program. It is not intended to diagnose MRSA infection nor to guide or monitor treatment for MRSA infections. RESULT CALLED TO, READ BACK BY AND VERIFIED WITH: Z.HASSAN,RN AT 0043 06/19/17 L.PITT       Radiology Studies: No results found.   Scheduled Meds: . Chlorhexidine Gluconate Cloth  6 each Topical Q0600  . folic acid  1 mg Oral Daily  . lactulose  20 g Oral Daily  . multivitamin with minerals  1 tablet Oral Once  . mupirocin ointment  1 application Nasal BID  . thiamine  100 mg Oral Daily   Continuous Infusions: . dextrose 5 % and 0.9% NaCl 50 mL/hr at 06/20/17 1024  . pencillin G potassium IV 3 Million Units (06/20/17 1122)    Domenic Polite, MD,  [facility] Page via [email or web address] password [email or web address]  If 7PM-7AM, please contact night-coverage  06/20/2017, 11:36 AM

## 2017-06-21 DIAGNOSIS — D696 Thrombocytopenia, unspecified: Secondary | ICD-10-CM

## 2017-06-21 DIAGNOSIS — K7290 Hepatic failure, unspecified without coma: Secondary | ICD-10-CM

## 2017-06-21 DIAGNOSIS — K922 Gastrointestinal hemorrhage, unspecified: Secondary | ICD-10-CM

## 2017-06-21 LAB — CBC
HCT: 18.2 % — ABNORMAL LOW (ref 39.0–52.0)
HCT: 20.1 % — ABNORMAL LOW (ref 39.0–52.0)
Hemoglobin: 6.6 g/dL — CL (ref 13.0–17.0)
Hemoglobin: 6.7 g/dL — CL (ref 13.0–17.0)
MCH: 32.5 pg (ref 26.0–34.0)
MCH: 34.2 pg — ABNORMAL HIGH (ref 26.0–34.0)
MCHC: 36.3 g/dL — ABNORMAL HIGH (ref 30.0–36.0)
MCHC: 33.3 g/dL (ref 30.0–36.0)
MCV: 89.7 fL (ref 78.0–100.0)
MCV: 102.6 fL — ABNORMAL HIGH (ref 78.0–100.0)
Platelets: 50 10*3/uL — ABNORMAL LOW (ref 150–400)
Platelets: 69 10*3/uL — ABNORMAL LOW (ref 150–400)
RBC: 2.03 MIL/uL — ABNORMAL LOW (ref 4.22–5.81)
RBC: 1.96 MIL/uL — ABNORMAL LOW (ref 4.22–5.81)
RDW: 13.5 % (ref 11.5–15.5)
RDW: 22.4 % — ABNORMAL HIGH (ref 11.5–15.5)
WBC: 17.5 10*3/uL — ABNORMAL HIGH (ref 4.0–10.5)
WBC: 15.8 10*3/uL — ABNORMAL HIGH (ref 4.0–10.5)

## 2017-06-21 LAB — GLUCOSE, CAPILLARY
Glucose-Capillary: 60 mg/dL — ABNORMAL LOW (ref 65–99)
Glucose-Capillary: 94 mg/dL (ref 65–99)

## 2017-06-21 LAB — COMPREHENSIVE METABOLIC PANEL
ALT: 24 U/L (ref 17–63)
AST: 83 U/L — ABNORMAL HIGH (ref 15–41)
Albumin: 1.4 g/dL — ABNORMAL LOW (ref 3.5–5.0)
Alkaline Phosphatase: 136 U/L — ABNORMAL HIGH (ref 38–126)
Anion gap: 8 (ref 5–15)
BUN: 51 mg/dL — ABNORMAL HIGH (ref 6–20)
CO2: 15 mmol/L — ABNORMAL LOW (ref 22–32)
Calcium: 7 mg/dL — ABNORMAL LOW (ref 8.9–10.3)
Chloride: 117 mmol/L — ABNORMAL HIGH (ref 101–111)
Creatinine, Ser: 1.66 mg/dL — ABNORMAL HIGH (ref 0.61–1.24)
GFR calc Af Amer: 49 mL/min — ABNORMAL LOW (ref >60)
GFR calc non Af Amer: 42 mL/min — ABNORMAL LOW (ref >60)
Glucose, Bld: 127 mg/dL — ABNORMAL HIGH (ref 65–99)
Potassium: 3.5 mmol/L (ref 3.5–5.1)
Sodium: 140 mmol/L (ref 135–145)
Total Bilirubin: 1.7 mg/dL — ABNORMAL HIGH (ref 0.3–1.2)
Total Protein: 4.8 g/dL — ABNORMAL LOW (ref 6.5–8.1)

## 2017-06-21 LAB — PREPARE RBC (CROSSMATCH)

## 2017-06-21 LAB — ABO/RH: ABO/RH(D): B POS

## 2017-06-21 MED ORDER — SODIUM CHLORIDE 0.9 % IV SOLN
Freq: Once | INTRAVENOUS | Status: AC
  Administered 2017-06-21: 17:00:00 via INTRAVENOUS

## 2017-06-21 MED ORDER — PANTOPRAZOLE SODIUM 40 MG IV SOLR
40.0000 mg | Freq: Two times a day (BID) | INTRAVENOUS | Status: DC
  Administered 2017-06-21 – 2017-06-23 (×6): 40 mg via INTRAVENOUS
  Filled 2017-06-21 (×6): qty 40

## 2017-06-21 MED ORDER — OCTREOTIDE LOAD VIA INFUSION
50.0000 ug | Freq: Once | INTRAVENOUS | Status: AC
  Administered 2017-06-21: 50 ug via INTRAVENOUS
  Filled 2017-06-21: qty 25

## 2017-06-21 MED ORDER — SODIUM CHLORIDE 0.9 % IV SOLN
50.0000 ug/h | INTRAVENOUS | Status: DC
  Administered 2017-06-21 – 2017-06-22 (×2): 50 ug/h via INTRAVENOUS
  Filled 2017-06-21 (×5): qty 1

## 2017-06-21 MED ORDER — SODIUM CHLORIDE 0.9 % IV SOLN
Freq: Once | INTRAVENOUS | Status: AC
  Administered 2017-06-21: 12:00:00 via INTRAVENOUS

## 2017-06-21 MED ORDER — LACTULOSE 10 GM/15ML PO SOLN
20.0000 g | Freq: Three times a day (TID) | ORAL | Status: DC
  Administered 2017-06-21 – 2017-06-22 (×4): 20 g via ORAL
  Filled 2017-06-21 (×4): qty 30

## 2017-06-21 NOTE — Progress Notes (Signed)
Patient projectile vomiting black jelly like emesis. Patient also found in black liquid stool in bed. MD paged and made aware.

## 2017-06-21 NOTE — Progress Notes (Signed)
PROGRESS NOTE  Mark [name] WVP:[id number] DOB: [birth year] DOA: 06/17/2017 PCP: [name], MD   LOS: 4 days   Brief Narrative / Interim history: This is a 62 year old male with history of alcoholic cirrhosis, ongoing alcohol abuse, history of esophageal varices, chronic anemia, hypertension, coronary artery disease with prior MI, thrombocytopenia, who presents with generalized weakness, nausea vomiting and diarrhea for the past few days.   -Found to have severe sepsis, group A strep bacteremia and hypotension, acute kidney injury was briefly in was briefly in the ICU 48 hours, did not have pressors, -Transferred from critical care to [facility] today 2/23  Assessment & Plan:  Severe sepsis/group A strep bacteremia -Secondary to right lower extremity cellulitis -continue IV penicillin G, completed 48hours of clindamycin will discontinue this now - repeat blood cultures, creatine today, phlebotomy was unable to obtain blood cultures yesterday - Once repeat cultures negative to transition to oral antibiotics -Will discuss case with infectious disease later this week, I suspect he will need 14 day course of total antibiotic coverage  Upper GI bleed /acute blood loss anemia -Hemoglobin dropped but 2 g this morning -Given alcoholic cirrhosis, definitely needs endoscopy, last EGD in 2017 with grade 1 varices -Started IV PPI and octreotide -[facility] consulted for endoscopy   Acute blood loss anemia  -As above, transfuse 2 units of PRBC   Right lower extremity cellulitis -Antibiotics as discussed above  Alcoholic cirrhosis of the liver -Patient being followed as an outpatient by Dr. Ardis Hughs, he unfortunately continues to drink about 1 - 40 ounce beer per day -Continue lactulose -Hold spironolactone, furosemide -Restart nadolol  Acute kidney injury on chronic kidney disease stage II-III -Baseline creatinine around 1.1-1.2, creatinine 6.5 on  admission, -Most likely secondary to sepsis and hypotension -Creatinine continues to trend down, stopped IV fluids, he will be getting some blood today   Hyponatremia -Likely in the setting of dehydration, improved  Thrombocytopenia -Likely in the setting of liver disease, no bleeding, monitor  Iron deficiency anemia -No bleeding, continue to monitor  DVT prophylaxis: SCDs Code Status: Full code Family Communication: no family at bedside Disposition Plan: May need SNF, will consult PT and OT  Consultants:   PCCM  Nephrology   Procedures:   None   Antimicrobials:  Vancomycin 2/20 >> 2/21  Zosyn 2/20 >> 2/21  Penicillin 2/21 >>  Clindamycin 2/21 >>   Subjective: -Had one episode of projectile coffee-ground emesis followed by an episode of dark stools this morning  Objective: Vitals:   06/21/17 0521 06/21/17 0952 06/21/17 1100 06/21/17 1115  BP: (!) 120/49 122/62 (!) 127/53 (!) 111/54  Pulse: 73 79 78 79  Resp: 18 18 20 20   Temp: 97.7 F (36.5 C) 97.6 F (36.4 C) 97.6 F (36.4 C) 98 F (36.7 C)  TempSrc: Oral Oral Oral Oral  SpO2: 100% 100% 100% 100%  Weight: 71.4 kg (157 lb 6.5 oz)     Height:        Intake/Output Summary (Last 24 hours) at 06/21/2017 1231 Last data filed at 06/21/2017 0600 Gross per 24 hour  Intake 1224.17 ml  Output 2175 ml  Net -950.83 ml   Filed Weights   06/18/17 2012 06/19/17 2058 06/21/17 0521  Weight: 73.9 kg (162 lb 14.7 oz) 71.9 kg (158 lb 8.2 oz) 71.4 kg (157 lb 6.5 oz)    Examination:  Gen: Chronically ill, disheveled male, laying in bed no distress HEENT: PERRLA, trace icterus noted Lungs: Faint basilar crackles, rest clear  CVS: S1-S2/regular rate rhythm Abd: soft, Non tender, non distended, BS present Extremities: Right leg with erythema and edema involving most of the lower leg, tenderness is improving from yesterday, no edema in the left leg Neuro: Moves all extremities no localising signs no  asterixis    Data Reviewed: I have independently reviewed following labs and imaging studies   CXR - no infiltrates EKG - sinus rhyhtm  CBC: Recent Labs  Lab 06/17/17 1525 06/18/17 0927 06/19/17 0320 06/20/17 0757 06/21/17 0724  WBC 4.7 5.2 14.9* 19.5* 17.5*  HGB 10.4* 8.5* 9.2* 8.5* 6.6*  HCT 28.9* 23.7* 25.2* 23.3* 18.2*  MCV 93.5 91.9 91.0 91.0 89.7  PLT 63* 43* 50* 44* 50*   Basic Metabolic Panel: Recent Labs  Lab 06/17/17 1525 06/17/17 2024 06/18/17 0927 06/19/17 0320 06/20/17 0757 06/21/17 0724  NA 122*  --  130* 134* 137 140  K 5.2*  --  3.6 4.0 3.7 3.5  CL 93*  --  106 111 114* 117*  CO2 17*  --  12* 13* 13* 15*  GLUCOSE 55*  --  81 59* 85 127*  BUN 76*  --  67* 63* 55* 51*  CREATININE 6.48*  --  4.50* 3.35* 2.27* 1.66*  CALCIUM 7.5*  --  6.2* 6.1* 6.8* 7.0*  MG  --  1.1* 1.5*  --   --   --   PHOS  --  6.1* 5.2*  --   --   --    GFR: Estimated Creatinine Clearance: 41.9 mL/min (A) (by C-G formula based on SCr of 1.66 mg/dL (H)). Liver Function Tests: Recent Labs  Lab 06/17/17 1525 06/18/17 0927 06/21/17 0724  AST 72* 69* 83*  ALT 24 21 24   ALKPHOS 63 45 136*  BILITOT 2.1* 2.2* 1.7*  PROT 6.3* 4.8* 4.8*  ALBUMIN 2.3* 1.8* 1.4*   Recent Labs  Lab 06/17/17 1525  LIPASE 23   Recent Labs  Lab 06/17/17 2124  AMMONIA 114*   Coagulation Profile: Recent Labs  Lab 06/17/17 2024  INR 1.38   Cardiac Enzymes: Recent Labs  Lab 06/17/17 2024  CKTOTAL 75   BNP (last 3 results) No results for input(s): PROBNP in the last 8760 hours. HbA1C: No results for input(s): HGBA1C in the last 72 hours. CBG: Recent Labs  Lab 06/20/17 0459 06/20/17 0817 06/20/17 1133 06/20/17 1703 06/20/17 2113  GLUCAP 97 92 80 116* 123*   Lipid Profile: No results for input(s): CHOL, HDL, LDLCALC, TRIG, CHOLHDL, LDLDIRECT in the last 72 hours. Thyroid Function Tests: No results for input(s): TSH, T4TOTAL, FREET4, T3FREE, THYROIDAB in the last 72  hours. Anemia Panel: No results for input(s): VITAMINB12, FOLATE, FERRITIN, TIBC, IRON, RETICCTPCT in the last 72 hours. Urine analysis:    Component Value Date/Time   COLORURINE AMBER (A) 06/17/2017 1649   APPEARANCEUR HAZY (A) 06/17/2017 1649   LABSPEC 1.013 06/17/2017 1649   PHURINE 5.0 06/17/2017 1649   GLUCOSEU NEGATIVE 06/17/2017 1649   HGBUR SMALL (A) 06/17/2017 1649   BILIRUBINUR NEGATIVE 06/17/2017 1649   KETONESUR NEGATIVE 06/17/2017 1649   PROTEINUR NEGATIVE 06/17/2017 1649   UROBILINOGEN 2.0 (H) 04/26/2013 0945   NITRITE NEGATIVE 06/17/2017 1649   LEUKOCYTESUR TRACE (A) 06/17/2017 1649   Sepsis Labs: Invalid input(s): PROCALCITONIN, LACTICIDVEN  Recent Results (from the past 240 hour(s))  Blood culture (routine x 2)     Status: Abnormal   Collection Time: 06/17/17  5:09 PM  Result Value Ref Range Status   Specimen Description BLOOD RIGHT  ANTECUBITAL  Final   Special Requests   Final    BOTTLES DRAWN AEROBIC AND ANAEROBIC Blood Culture adequate volume   Culture  Setup Time   Final    GRAM POSITIVE COCCI IN BOTH AEROBIC AND ANAEROBIC BOTTLES CRITICAL RESULT CALLED TO, READ BACK BY AND VERIFIED WITH: K PATTON PHARMD 06/18/17 0735 JDW    Culture (A)  Final    GROUP A STREP (S.PYOGENES) ISOLATED HEALTH DEPARTMENT NOTIFIED Performed at [facility], [place] [address] [place], [place] [address]    Report Status 06/20/2017 FINAL  Final   Organism ID, Bacteria GROUP A STREP (S.PYOGENES) ISOLATED  Final      Susceptibility   Group a strep (s.pyogenes) isolated - MIC*    PENICILLIN <=0.06 SENSITIVE Sensitive     CEFTRIAXONE <=0.12 SENSITIVE Sensitive     ERYTHROMYCIN <=0.12 SENSITIVE Sensitive     LEVOFLOXACIN 0.5 SENSITIVE Sensitive     VANCOMYCIN <=0.12 SENSITIVE Sensitive     * GROUP A STREP (S.PYOGENES) ISOLATED  Blood culture (routine x 2)     Status: Abnormal   Collection Time: 06/17/17  5:26 PM  Result Value Ref Range Status   Specimen Description BLOOD  LEFT ANTECUBITAL  Final   Special Requests   Final    BOTTLES DRAWN AEROBIC AND ANAEROBIC Blood Culture adequate volume   Culture  Setup Time   Final    GRAM POSITIVE COCCI IN BOTH AEROBIC AND ANAEROBIC BOTTLES CRITICAL RESULT CALLED TO, READ BACK BY AND VERIFIED WITH: K PATTON PHARMD 06/18/17 0735 JDW    Culture (A)  Final    GROUP A STREP (S.PYOGENES) ISOLATED SUSCEPTIBILITIES PERFORMED ON PREVIOUS CULTURE WITHIN THE LAST 5 DAYS. HEALTH DEPARTMENT NOTIFIED Performed at [facility], [place] [address] [place], [place] [address]    Report Status 06/20/2017 FINAL  Final  Blood Culture ID Panel (Reflexed)     Status: Abnormal   Collection Time: 06/17/17  5:26 PM  Result Value Ref Range Status   Enterococcus species NOT DETECTED NOT DETECTED Final   Listeria monocytogenes NOT DETECTED NOT DETECTED Final   Staphylococcus species NOT DETECTED NOT DETECTED Final   Staphylococcus aureus NOT DETECTED NOT DETECTED Final   Streptococcus species DETECTED (A) NOT DETECTED Final    Comment: CRITICAL RESULT CALLED TO, READ BACK BY AND VERIFIED WITH: K PATTON PHARMD 06/18/17 0735 JDW    Streptococcus agalactiae NOT DETECTED NOT DETECTED Final   Streptococcus pneumoniae NOT DETECTED NOT DETECTED Final   Streptococcus pyogenes DETECTED (A) NOT DETECTED Final    Comment: CRITICAL RESULT CALLED TO, READ BACK BY AND VERIFIED WITH: K PATTON PHARMD 06/18/17 0735 JDW    Acinetobacter baumannii NOT DETECTED NOT DETECTED Final   Enterobacteriaceae species NOT DETECTED NOT DETECTED Final   Enterobacter cloacae complex NOT DETECTED NOT DETECTED Final   Escherichia coli NOT DETECTED NOT DETECTED Final   Klebsiella oxytoca NOT DETECTED NOT DETECTED Final   Klebsiella pneumoniae NOT DETECTED NOT DETECTED Final   Proteus species NOT DETECTED NOT DETECTED Final   Serratia marcescens NOT DETECTED NOT DETECTED Final   Haemophilus influenzae NOT DETECTED NOT DETECTED Final   Neisseria meningitidis NOT  DETECTED NOT DETECTED Final   Pseudomonas aeruginosa NOT DETECTED NOT DETECTED Final   Candida albicans NOT DETECTED NOT DETECTED Final   Candida glabrata NOT DETECTED NOT DETECTED Final   Candida krusei NOT DETECTED NOT DETECTED Final   Candida parapsilosis NOT DETECTED NOT DETECTED Final   Candida tropicalis NOT DETECTED  NOT DETECTED Final    Comment: Performed at [facility], [place] [address] [place], [place] [address]  Urine Culture     Status: None   Collection Time: 06/17/17 10:04 PM  Result Value Ref Range Status   Specimen Description URINE, CLEAN CATCH  Final   Special Requests NONE  Final   Culture   Final    NO GROWTH Performed at [facility], [place] [address], [place], [place] [address]    Report Status 06/19/2017 FINAL  Final  Gastrointestinal Panel by PCR , Stool     Status: None   Collection Time: 06/18/17  6:41 PM  Result Value Ref Range Status   Campylobacter species NOT DETECTED NOT DETECTED Final   Plesimonas shigelloides NOT DETECTED NOT DETECTED Final   Salmonella species NOT DETECTED NOT DETECTED Final   Yersinia enterocolitica NOT DETECTED NOT DETECTED Final   Vibrio species NOT DETECTED NOT DETECTED Final   Vibrio cholerae NOT DETECTED NOT DETECTED Final   Enteroaggregative E coli (EAEC) NOT DETECTED NOT DETECTED Final   Enteropathogenic E coli (EPEC) NOT DETECTED NOT DETECTED Final   Enterotoxigenic E coli (ETEC) NOT DETECTED NOT DETECTED Final   Shiga like toxin producing E coli (STEC) NOT DETECTED NOT DETECTED Final   Shigella/Enteroinvasive E coli (EIEC) NOT DETECTED NOT DETECTED Final   Cryptosporidium NOT DETECTED NOT DETECTED Final   Cyclospora cayetanensis NOT DETECTED NOT DETECTED Final   Entamoeba histolytica NOT DETECTED NOT DETECTED Final   Giardia lamblia NOT DETECTED NOT DETECTED Final   Adenovirus F40/41 NOT DETECTED NOT DETECTED Final   Astrovirus NOT DETECTED NOT DETECTED Final   Norovirus GI/GII NOT DETECTED NOT DETECTED Final    Rotavirus A NOT DETECTED NOT DETECTED Final   Sapovirus (I, II, IV, and V) NOT DETECTED NOT DETECTED Final    Comment: Performed at [facility], [place] [place], [place] [address]  MRSA PCR Screening     Status: Abnormal   Collection Time: 06/18/17  8:10 PM  Result Value Ref Range Status   MRSA by PCR POSITIVE (A) NEGATIVE Final    Comment:        The GeneXpert MRSA Assay (FDA approved for NASAL specimens only), is one component of a comprehensive MRSA colonization surveillance program. It is not intended to diagnose MRSA infection nor to guide or monitor treatment for MRSA infections. RESULT CALLED TO, READ BACK BY AND VERIFIED WITH: Z.HASSAN,RN AT 0043 06/19/17 L.PITT       Radiology Studies: No results found.   Scheduled Meds: . Chlorhexidine Gluconate Cloth  6 each Topical Q0600  . folic acid  1 mg Oral Daily  . lactulose  20 g Oral TID  . multivitamin with minerals  1 tablet Oral Once  . mupirocin ointment  1 application Nasal BID  . nadolol  10 mg Oral Daily  . pantoprazole (PROTONIX) IV  40 mg Intravenous Q12H  . thiamine  100 mg Oral Daily   Continuous Infusions: . sodium chloride    . octreotide  (SANDOSTATIN)    IV infusion 50 mcg/hr (06/21/17 1020)  . pencillin G potassium IV Stopped (06/21/17 1131)    Domenic Polite, MD,  [facility] Page via [email or web address] password [email or web address]  If 7PM-7AM, please contact night-coverage  06/21/2017, 12:31 PM

## 2017-06-21 NOTE — Progress Notes (Signed)
PT Cancellation Note  Patient Details Name: Mark [name] MRN: [id number] DOB: 30-Mar-1956   Cancelled Treatment:    Reason Eval/Treat Not Completed: Medical issues which prohibited therapy. Per RN, pt has been coughing up blood and had blood in his BM. Pt will need a transfusion. Request PT hold until tomorrow.    Scheryl Marten PT, DPT    06/21/2017, 9:23 AM

## 2017-06-21 NOTE — Progress Notes (Signed)
Spoke with RN re PICC line order- positive blood cultures and potential for PIV.  IVT RN to attempt PIV and obtain blood sample.

## 2017-06-21 NOTE — Consult Note (Addendum)
Referring Provider: Dr. Broadus John Primary Care Physician:  [name], MD Primary Gastroenterologist:  Dr. Ardis Hughs   Reason for Consultation:  Coffee ground emesis, hematemesis in cirrhotic with known varices  HPI: Mark [name] is a 62 y.o. male with past medical history significant for alcohol abuse and cirrhosis-portal hypertension, history of varices, coronary artery disease and anemia.  There is also question of other substance abuse.  Was admitted on 2/20 for cellulitis of RLE and sepsis/group A strep bacteremia.  GI was consulted today after patient vomited black jelly like substance.  Hgb 6.6 grams this AM, down 2 grams from yesterday.  He also reports several episodes of dark stools.  He is on pantoprazole 40 mg BID and we have recommended starting octreotide gtt so that has been done.  He is being transfuse PRBC's.  Last EGD 01/2016 with Grade 1 varices and moderate portal gastropathy.  Is on nadolol 20 mg daily at home.  He is alert and oriented but has asterixis on exam today-only on lactulose 20 gm daily.  Platelets low at 50.  INR was 1.38 on admission and has not been checked since that time.   Past Medical History:  Diagnosis Date  . Alcohol abuse   . Anemia   . Arthritis   . Ascites   . Cirrhosis ([place])   . Heart murmur   . Hyperlipidemia   . Hypertension   . Leg swelling   . Myocardial infarction ([place]) 2012  . Stroke [facility]) 2012   no deficits  . Thrombocytopenia ([place])     Past Surgical History:  Procedure Laterality Date  . COLONOSCOPY WITH PROPOFOL N/A 02/07/2016   Procedure: COLONOSCOPY WITH PROPOFOL;  Surgeon: Milus Banister, MD;  Location: [facility] ENDOSCOPY;  Service: Endoscopy;  Laterality: N/A;  . ESOPHAGOGASTRODUODENOSCOPY (EGD) WITH PROPOFOL N/A 02/07/2016   Procedure: ESOPHAGOGASTRODUODENOSCOPY (EGD) WITH PROPOFOL;  Surgeon: Milus Banister, MD;  Location: [facility] ENDOSCOPY;  Service: Endoscopy;  Laterality: N/A;  . HERNIA REPAIR Right    inguinal  .  KNEE ARTHROSCOPY     bilateral/  12/14  . TOTAL KNEE ARTHROPLASTY Right 05/02/2013   Procedure: RIGHT TOTAL KNEE ARTHROPLASTY;  Surgeon: Mauri Pole, MD;  Location: [facility] ORS;  Service: Orthopedics;  Laterality: Right;    Prior to Admission medications   Medication Sig Start Date End Date Taking? Authorizing Provider  furosemide (LASIX) 20 MG tablet Take 1 tablet (20 mg total) by mouth 2 (two) times daily. 01/19/17  Yes Levin Erp, PA  nadolol (CORGARD) 20 MG tablet Take 1 tablet (20 mg total) by mouth daily. 01/19/17  Yes Levin Erp, PA  spironolactone (ALDACTONE) 50 MG tablet Take 2 tablets (100 mg total) by mouth 2 (two) times daily. 01/19/17  Yes Levin Erp, PA    Current Facility-Administered Medications  Medication Dose Route Frequency Provider Last Rate Last Dose  . 0.9 %  sodium chloride infusion   Intravenous Once Domenic Polite, MD      . 0.9 %  sodium chloride infusion   Intravenous Once Domenic Polite, MD      . acetaminophen (TYLENOL) tablet 650 mg  650 mg Oral Q6H PRN Toy Baker, MD       Or  . acetaminophen (TYLENOL) suppository 650 mg  650 mg Rectal Q6H PRN Doutova, Anastassia, MD      . Chlorhexidine Gluconate Cloth 2 % PADS 6 each  6 each Topical [id number] Sharia Reeve, MD   6 each at 06/21/17  0160  . folic acid (FOLVITE) tablet 1 mg  1 mg Oral Daily Susa Raring, [place]   Stopped at 06/21/17 0840  . lactulose (CHRONULAC) 10 GM/15ML solution 20 g  20 g Oral TID Zehr, Laban Emperor, PA-C      . LORazepam (ATIVAN) injection 2-3 mg  2-3 mg Intravenous Q1H PRN Doutova, Anastassia, MD      . multivitamin with minerals tablet 1 tablet  1 tablet Oral Once Virgel Manifold, MD      . mupirocin ointment (BACTROBAN) 2 % 1 application  1 application Nasal BID Sharia Reeve, MD   1 application at 10/93/23 5573  . nadolol (CORGARD) tablet 10 mg  10 mg Oral Daily Domenic Polite, MD   Stopped at 06/21/17 [phone]  . octreotide (SANDOSTATIN) 500 mcg in  sodium chloride 0.9 % 250 mL (2 mcg/mL) infusion  50 mcg/hr Intravenous Continuous Domenic Polite, MD 25 mL/hr at 06/21/17 1020 50 mcg/hr at 06/21/17 1020  . ondansetron (ZOFRAN) tablet 4 mg  4 mg Oral Q6H PRN Doutova, Anastassia, MD       Or  . ondansetron (ZOFRAN) injection 4 mg  4 mg Intravenous Q6H PRN Toy Baker, MD   4 mg at 06/21/17 0825  . pantoprazole (PROTONIX) injection 40 mg  40 mg Intravenous Q12H Domenic Polite, MD   40 mg at 06/21/17 0908  . penicillin G potassium 3 Million Units in dextrose 56mL IVPB  3 Million Units Intravenous Q4H Marshell Garfinkel, MD   Stopped at 06/21/17 0916  . thiamine (VITAMIN B-1) tablet 100 mg  100 mg Oral Daily Susa Raring, [place]   Stopped at 06/21/17 5427    Allergies as of 06/17/2017  . (No Known Allergies)    Family History  Problem Relation Age of Onset  . Hypertension Father   . Diabetes Father   . Dementia Mother   . Lupus Sister     Social History   Socioeconomic History  . Marital status: Married    Spouse name: Not on file  . Number of children: 4  . Years of education: Not on file  . Highest education level: Not on file  Social Needs  . Financial resource strain: Not on file  . Food insecurity - worry: Not on file  . Food insecurity - inability: Not on file  . Transportation needs - medical: Not on file  . Transportation needs - non-medical: Not on file  Occupational History  . Occupation: Maintenance    Employer: [employer]  Tobacco Use  . Smoking status: Former Smoker    Packs/day: 0.50    Years: 10.00    Pack years: 5.00    Types: Cigarettes    Last attempt to quit: 01/05/2016    Years since quitting: 1.4  . Smokeless tobacco: Never Used  . Tobacco comment: 4 cigarettes a day  Substance and Sexual Activity  . Alcohol use: No    Comment: 40 oz per week as of 01-19-17  . Drug use: Yes    Types: Marijuana, Heroin    Comment: took about 1 month ago- as of 01-19-17  . Sexual activity: Not on  file    Comment: now and then smokies marijuana  Other Topics Concern  . Not on file  Social History Narrative  . Not on file    Review of Systems: ROS is negative except as mentioned in HPI.  Physical Exam: Vital signs in last 24 hours: Temp:  [97.5 F (36.4  C)-97.7 F (36.5 C)] 97.6 F (36.4 C) (02/24 0952) Pulse Rate:  [66-79] 79 (02/24 0952) Resp:  [18] 18 (02/24 0952) BP: (120-131)/(49-62) 122/62 (02/24 0952) SpO2:  [100 %] 100 % (02/24 0952) Weight:  [157 lb 6.5 oz (71.4 kg)] 157 lb 6.5 oz (71.4 kg) (02/24 0521) Last BM Date: 06/20/17 General:  Alert, Well-developed, well-nourished, pleasant and cooperative in NAD Head:  Normocephalic and atraumatic. Eyes:  Sclera clear, no icterus.  Conjunctiva pale. Ears:  Normal auditory acuity. Mouth:  No deformity or lesions.   Lungs:  Clear throughout to auscultation.  No wheezes, crackles, or rhonchi.  Heart:  Regular rate and rhythm; no murmurs, clicks, rubs, or gallops. Abdomen:  Soft, slightly distended.  BS present.  Non-tender. Rectal:  Deferred  Msk:  Symmetrical without gross deformities. Pulses:  Normal pulses noted. Extremities:  Right LE edema noted. Neurologic:  Alert and oriented x 4;  grossly normal neurologically. Skin:  Intact without significant lesions or rashes. Psych:  Alert and cooperative. Normal mood and affect.  Intake/Output from previous day: 02/23 0701 - 02/24 0700 In: 2215 [P.O.:960; I.V.:855; IV Piggyback:400] Out: 2175 [Urine:1375; Stool:800]  Lab Results: Recent Labs    06/19/17 0320 06/20/17 0757 06/21/17 0724  WBC 14.9* 19.5* 17.5*  HGB 9.2* 8.5* 6.6*  HCT 25.2* 23.3* 18.2*  PLT 50* 44* 50*   BMET Recent Labs    06/19/17 0320 06/20/17 0757 06/21/17 0724  NA 134* 137 140  K 4.0 3.7 3.5  CL 111 114* 117*  CO2 13* 13* 15*  GLUCOSE 59* 85 127*  BUN 63* 55* 51*  CREATININE 3.35* 2.27* 1.66*  CALCIUM 6.1* 6.8* 7.0*   LFT Recent Labs    06/21/17 0724  PROT 4.8*  ALBUMIN  1.4*  AST 83*  ALT 24  ALKPHOS 136*  BILITOT 1.7*   IMPRESSION:  *Coffee ground emesis in a patient with known ETOH cirrhosis and esophageal varices.  Also reports several dark stools.  Last EGD 01/2016 with Grade 1 varices and moderate portal gastropathy.  Is on nadolol 20 mg daily at home.  Is on PPI IV BID and we have recommended octreotide gtt, which has been started.  He has asterixis on exam today-only on lactulose 20 gm daily. *Acute on chronic anemia:  Hgb 6.6 grams this AM, which is down 2 grams from yesterday. *Thrombocytopenia:  Secondary to cirrhosis. *Coagulopathy:  Secondary to cirrhosis.  INR 1.38 on admission.  Will recheck PT/INR today. *Right lower extremity cellulitis with severe sepsis/group A strep bacteremia.  On antibiotics.  PLAN: *Agree with IV PPI BID and octreotide gtt for now. *Receiving PRBC transfusion. *Will plan for EGD on 2/25.  Just ice chips for now and then NPO after midnight. *Will increase lactulose to 20 grams TID.   Laban Emperor. Zehr  06/21/2017, 10:55 AM  Pager number [phone]     Attending physician's note   I have taken a history, examined the patient and reviewed the chart. I agree with the Advanced Practitioner's note, impression and recommendations.  47 yr M with ETOH cirrhosis, low platelets 50K, INR 1.38 had an episode of coffee ground emesis this AM with drop in Hgb to 6.6. Patient says he has been having dark stool for few days.  Monitor Hgb q12h and transfuse to goal Hgb 7, avoid over transfusion as can increase portal pressure thereby increase risk for bleeding if due to variceal hemorrhage  He is currently hemodynamically stable on the floor Start Octreotide gtt and continue PPI  Plan for EGD tomorrow AM The risks and benefits as well as alternatives of endoscopic procedure(s) have been discussed and reviewed. All questions answered. The patient agrees to proceed.  He is alert and oriented x 3 but has asterixis on exam, will  increase Lactulose dose  No significant ascites or portal vein thrombus based on recent CT abd & pelvis 06/17/17  K Denzil Magnuson, MD [phone] Mon-Fri 8a-5p [phone] after 5p, weekends, holidays

## 2017-06-21 NOTE — H&P (View-Only) (Signed)
Referring Provider: Dr. Broadus John Primary Care Physician:  [name], MD Primary Gastroenterologist:  Dr. Ardis Hughs   Reason for Consultation:  Coffee ground emesis, hematemesis in cirrhotic with known varices  HPI: Mark [name] is a 62 y.o. male with past medical history significant for alcohol abuse and cirrhosis-portal hypertension, history of varices, coronary artery disease and anemia.  There is also question of other substance abuse.  Was admitted on 2/20 for cellulitis of RLE and sepsis/group A strep bacteremia.  GI was consulted today after patient vomited black jelly like substance.  Hgb 6.6 grams this AM, down 2 grams from yesterday.  He also reports several episodes of dark stools.  He is on pantoprazole 40 mg BID and we have recommended starting octreotide gtt so that has been done.  He is being transfuse PRBC's.  Last EGD 01/2016 with Grade 1 varices and moderate portal gastropathy.  Is on nadolol 20 mg daily at home.  He is alert and oriented but has asterixis on exam today-only on lactulose 20 gm daily.  Platelets low at 50.  INR was 1.38 on admission and has not been checked since that time.   Past Medical History:  Diagnosis Date  . Alcohol abuse   . Anemia   . Arthritis   . Ascites   . Cirrhosis ([place])   . Heart murmur   . Hyperlipidemia   . Hypertension   . Leg swelling   . Myocardial infarction ([place]) 2012  . Stroke [facility]) 2012   no deficits  . Thrombocytopenia ([place])     Past Surgical History:  Procedure Laterality Date  . COLONOSCOPY WITH PROPOFOL N/A 02/07/2016   Procedure: COLONOSCOPY WITH PROPOFOL;  Surgeon: Milus Banister, MD;  Location: [facility] ENDOSCOPY;  Service: Endoscopy;  Laterality: N/A;  . ESOPHAGOGASTRODUODENOSCOPY (EGD) WITH PROPOFOL N/A 02/07/2016   Procedure: ESOPHAGOGASTRODUODENOSCOPY (EGD) WITH PROPOFOL;  Surgeon: Milus Banister, MD;  Location: [facility] ENDOSCOPY;  Service: Endoscopy;  Laterality: N/A;  . HERNIA REPAIR Right    inguinal  .  KNEE ARTHROSCOPY     bilateral/  12/14  . TOTAL KNEE ARTHROPLASTY Right 05/02/2013   Procedure: RIGHT TOTAL KNEE ARTHROPLASTY;  Surgeon: Mauri Pole, MD;  Location: [facility] ORS;  Service: Orthopedics;  Laterality: Right;    Prior to Admission medications   Medication Sig Start Date End Date Taking? Authorizing Provider  furosemide (LASIX) 20 MG tablet Take 1 tablet (20 mg total) by mouth 2 (two) times daily. 01/19/17  Yes Levin Erp, PA  nadolol (CORGARD) 20 MG tablet Take 1 tablet (20 mg total) by mouth daily. 01/19/17  Yes Levin Erp, PA  spironolactone (ALDACTONE) 50 MG tablet Take 2 tablets (100 mg total) by mouth 2 (two) times daily. 01/19/17  Yes Levin Erp, PA    Current Facility-Administered Medications  Medication Dose Route Frequency Provider Last Rate Last Dose  . 0.9 %  sodium chloride infusion   Intravenous Once Domenic Polite, MD      . 0.9 %  sodium chloride infusion   Intravenous Once Domenic Polite, MD      . acetaminophen (TYLENOL) tablet 650 mg  650 mg Oral Q6H PRN Toy Baker, MD       Or  . acetaminophen (TYLENOL) suppository 650 mg  650 mg Rectal Q6H PRN Doutova, Anastassia, MD      . Chlorhexidine Gluconate Cloth 2 % PADS 6 each  6 each Topical X1062 Sharia Reeve, MD   6 each at 06/21/17  3244  . folic acid (FOLVITE) tablet 1 mg  1 mg Oral Daily Susa Raring, [place]   Stopped at 06/21/17 0840  . lactulose (CHRONULAC) 10 GM/15ML solution 20 g  20 g Oral TID Denyce Harr, Laban Emperor, PA-C      . LORazepam (ATIVAN) injection 2-3 mg  2-3 mg Intravenous Q1H PRN Doutova, Anastassia, MD      . multivitamin with minerals tablet 1 tablet  1 tablet Oral Once Virgel Manifold, MD      . mupirocin ointment (BACTROBAN) 2 % 1 application  1 application Nasal BID Sharia Reeve, MD   1 application at 04/29/70 5366  . nadolol (CORGARD) tablet 10 mg  10 mg Oral Daily Domenic Polite, MD   Stopped at 06/21/17 [phone]  . octreotide (SANDOSTATIN) 500 mcg in  sodium chloride 0.9 % 250 mL (2 mcg/mL) infusion  50 mcg/hr Intravenous Continuous Domenic Polite, MD 25 mL/hr at 06/21/17 1020 50 mcg/hr at 06/21/17 1020  . ondansetron (ZOFRAN) tablet 4 mg  4 mg Oral Q6H PRN Doutova, Anastassia, MD       Or  . ondansetron (ZOFRAN) injection 4 mg  4 mg Intravenous Q6H PRN Toy Baker, MD   4 mg at 06/21/17 0825  . pantoprazole (PROTONIX) injection 40 mg  40 mg Intravenous Q12H Domenic Polite, MD   40 mg at 06/21/17 0908  . penicillin G potassium 3 Million Units in dextrose 42mL IVPB  3 Million Units Intravenous Q4H Marshell Garfinkel, MD   Stopped at 06/21/17 0916  . thiamine (VITAMIN B-1) tablet 100 mg  100 mg Oral Daily Susa Raring, [place]   Stopped at 06/21/17 4742    Allergies as of 06/17/2017  . (No Known Allergies)    Family History  Problem Relation Age of Onset  . Hypertension Father   . Diabetes Father   . Dementia Mother   . Lupus Sister     Social History   Socioeconomic History  . Marital status: Married    Spouse name: Not on file  . Number of children: 4  . Years of education: Not on file  . Highest education level: Not on file  Social Needs  . Financial resource strain: Not on file  . Food insecurity - worry: Not on file  . Food insecurity - inability: Not on file  . Transportation needs - medical: Not on file  . Transportation needs - non-medical: Not on file  Occupational History  . Occupation: Maintenance    Employer: [employer]  Tobacco Use  . Smoking status: Former Smoker    Packs/day: 0.50    Years: 10.00    Pack years: 5.00    Types: Cigarettes    Last attempt to quit: 01/05/2016    Years since quitting: 1.4  . Smokeless tobacco: Never Used  . Tobacco comment: 4 cigarettes a day  Substance and Sexual Activity  . Alcohol use: No    Comment: 40 oz per week as of 01-19-17  . Drug use: Yes    Types: Marijuana, Heroin    Comment: took about 1 month ago- as of 01-19-17  . Sexual activity: Not on  file    Comment: now and then smokies marijuana  Other Topics Concern  . Not on file  Social History Narrative  . Not on file    Review of Systems: ROS is negative except as mentioned in HPI.  Physical Exam: Vital signs in last 24 hours: Temp:  [97.5 F (36.4  C)-97.7 F (36.5 C)] 97.6 F (36.4 C) (02/24 0952) Pulse Rate:  [66-79] 79 (02/24 0952) Resp:  [18] 18 (02/24 0952) BP: (120-131)/(49-62) 122/62 (02/24 0952) SpO2:  [100 %] 100 % (02/24 0952) Weight:  [157 lb 6.5 oz (71.4 kg)] 157 lb 6.5 oz (71.4 kg) (02/24 0521) Last BM Date: 06/20/17 General:  Alert, Well-developed, well-nourished, pleasant and cooperative in NAD Head:  Normocephalic and atraumatic. Eyes:  Sclera clear, no icterus.  Conjunctiva pale. Ears:  Normal auditory acuity. Mouth:  No deformity or lesions.   Lungs:  Clear throughout to auscultation.  No wheezes, crackles, or rhonchi.  Heart:  Regular rate and rhythm; no murmurs, clicks, rubs, or gallops. Abdomen:  Soft, slightly distended.  BS present.  Non-tender. Rectal:  Deferred  Msk:  Symmetrical without gross deformities. Pulses:  Normal pulses noted. Extremities:  Right LE edema noted. Neurologic:  Alert and oriented x 4;  grossly normal neurologically. Skin:  Intact without significant lesions or rashes. Psych:  Alert and cooperative. Normal mood and affect.  Intake/Output from previous day: 02/23 0701 - 02/24 0700 In: 2215 [P.O.:960; I.V.:855; IV Piggyback:400] Out: 2175 [Urine:1375; Stool:800]  Lab Results: Recent Labs    06/19/17 0320 06/20/17 0757 06/21/17 0724  WBC 14.9* 19.5* 17.5*  HGB 9.2* 8.5* 6.6*  HCT 25.2* 23.3* 18.2*  PLT 50* 44* 50*   BMET Recent Labs    06/19/17 0320 06/20/17 0757 06/21/17 0724  NA 134* 137 140  K 4.0 3.7 3.5  CL 111 114* 117*  CO2 13* 13* 15*  GLUCOSE 59* 85 127*  BUN 63* 55* 51*  CREATININE 3.35* 2.27* 1.66*  CALCIUM 6.1* 6.8* 7.0*   LFT Recent Labs    06/21/17 0724  PROT 4.8*  ALBUMIN  1.4*  AST 83*  ALT 24  ALKPHOS 136*  BILITOT 1.7*   IMPRESSION:  *Coffee ground emesis in a patient with known ETOH cirrhosis and esophageal varices.  Also reports several dark stools.  Last EGD 01/2016 with Grade 1 varices and moderate portal gastropathy.  Is on nadolol 20 mg daily at home.  Is on PPI IV BID and we have recommended octreotide gtt, which has been started.  He has asterixis on exam today-only on lactulose 20 gm daily. *Acute on chronic anemia:  Hgb 6.6 grams this AM, which is down 2 grams from yesterday. *Thrombocytopenia:  Secondary to cirrhosis. *Coagulopathy:  Secondary to cirrhosis.  INR 1.38 on admission.  Will recheck PT/INR today. *Right lower extremity cellulitis with severe sepsis/group A strep bacteremia.  On antibiotics.  PLAN: *Agree with IV PPI BID and octreotide gtt for now. *Receiving PRBC transfusion. *Will plan for EGD on 2/25.  Just ice chips for now and then NPO after midnight. *Will increase lactulose to 20 grams TID.   Laban Emperor. Navid Lenzen  06/21/2017, 10:55 AM  Pager number [phone]     Attending physician's note   I have taken a history, examined the patient and reviewed the chart. I agree with the Advanced Practitioner's note, impression and recommendations.  10 yr M with ETOH cirrhosis, low platelets 50K, INR 1.38 had an episode of coffee ground emesis this AM with drop in Hgb to 6.6. Patient says he has been having dark stool for few days.  Monitor Hgb q12h and transfuse to goal Hgb 7, avoid over transfusion as can increase portal pressure thereby increase risk for bleeding if due to variceal hemorrhage  He is currently hemodynamically stable on the floor Start Octreotide gtt and continue PPI  Plan for EGD tomorrow AM The risks and benefits as well as alternatives of endoscopic procedure(s) have been discussed and reviewed. All questions answered. The patient agrees to proceed.  He is alert and oriented x 3 but has asterixis on exam, will  increase Lactulose dose  No significant ascites or portal vein thrombus based on recent CT abd & pelvis 06/17/17  K Denzil Magnuson, MD [phone] Mon-Fri 8a-5p [phone] after 5p, weekends, holidays

## 2017-06-21 NOTE — Progress Notes (Signed)
Attending physician text paged and notified of critical hemoglobin of 6.6.

## 2017-06-22 ENCOUNTER — Encounter (HOSPITAL_COMMUNITY)
Admission: EM | Disposition: A | Discharge status: Home Health | Payer: Self-pay | Source: Home / Self Care | Attending: Internal Medicine

## 2017-06-22 ENCOUNTER — Inpatient Hospital Stay (HOSPITAL_COMMUNITY): Payer: BC Managed Care – PPO | Admitting: Certified Registered Nurse Anesthetist

## 2017-06-22 ENCOUNTER — Encounter (HOSPITAL_COMMUNITY): Payer: Self-pay | Admitting: *Deleted

## 2017-06-22 DIAGNOSIS — D649 Anemia, unspecified: Secondary | ICD-10-CM

## 2017-06-22 DIAGNOSIS — K3189 Other diseases of stomach and duodenum: Secondary | ICD-10-CM

## 2017-06-22 DIAGNOSIS — K449 Diaphragmatic hernia without obstruction or gangrene: Secondary | ICD-10-CM

## 2017-06-22 DIAGNOSIS — K766 Portal hypertension: Secondary | ICD-10-CM

## 2017-06-22 DIAGNOSIS — K920 Hematemesis: Secondary | ICD-10-CM

## 2017-06-22 DIAGNOSIS — K2980 Duodenitis without bleeding: Secondary | ICD-10-CM

## 2017-06-22 DIAGNOSIS — K210 Gastro-esophageal reflux disease with esophagitis: Secondary | ICD-10-CM

## 2017-06-22 HISTORY — PX: ESOPHAGOGASTRODUODENOSCOPY (EGD) WITH PROPOFOL: SHX5813

## 2017-06-22 LAB — CBC WITH DIFFERENTIAL/PLATELET
Basophils Absolute: 0 10*3/uL (ref 0.0–0.1)
Basophils Relative: 0 %
Eosinophils Absolute: 0 10*3/uL (ref 0.0–0.7)
Eosinophils Relative: 0 %
HCT: 24.4 % — ABNORMAL LOW (ref 39.0–52.0)
Hemoglobin: 8.8 g/dL — ABNORMAL LOW (ref 13.0–17.0)
Lymphocytes Relative: 17 %
Lymphs Abs: 2.5 10*3/uL (ref 0.7–4.0)
MCH: 31.3 pg (ref 26.0–34.0)
MCHC: 36.1 g/dL — ABNORMAL HIGH (ref 30.0–36.0)
MCV: 86.8 fL (ref 78.0–100.0)
Monocytes Absolute: 0.7 10*3/uL (ref 0.1–1.0)
Monocytes Relative: 5 %
Neutro Abs: 11.5 10*3/uL — ABNORMAL HIGH (ref 1.7–7.7)
Neutrophils Relative %: 78 %
Platelets: 47 10*3/uL — ABNORMAL LOW (ref 150–400)
RBC: 2.81 MIL/uL — ABNORMAL LOW (ref 4.22–5.81)
RDW: 15.2 % (ref 11.5–15.5)
WBC: 14.7 10*3/uL — ABNORMAL HIGH (ref 4.0–10.5)

## 2017-06-22 LAB — BPAM RBC
Blood Product Expiration Date: 201903222359
Blood Product Expiration Date: 201903222359
ISSUE DATE / TIME: 201902241104
ISSUE DATE / TIME: 201902241423
Unit Type and Rh: 7300
Unit Type and Rh: 7300

## 2017-06-22 LAB — BASIC METABOLIC PANEL
Anion gap: 6 (ref 5–15)
BUN: 52 mg/dL — ABNORMAL HIGH (ref 6–20)
CO2: 17 mmol/L — ABNORMAL LOW (ref 22–32)
Calcium: 7.3 mg/dL — ABNORMAL LOW (ref 8.9–10.3)
Chloride: 118 mmol/L — ABNORMAL HIGH (ref 101–111)
Creatinine, Ser: 1.48 mg/dL — ABNORMAL HIGH (ref 0.61–1.24)
GFR calc Af Amer: 56 mL/min — ABNORMAL LOW (ref >60)
GFR calc non Af Amer: 49 mL/min — ABNORMAL LOW (ref >60)
Glucose, Bld: 96 mg/dL (ref 65–99)
Potassium: 4.2 mmol/L (ref 3.5–5.1)
Sodium: 141 mmol/L (ref 135–145)

## 2017-06-22 LAB — TYPE AND SCREEN
ABO/RH(D): B POS
Antibody Screen: NEGATIVE
Unit division: 0
Unit division: 0

## 2017-06-22 SURGERY — ESOPHAGOGASTRODUODENOSCOPY (EGD) WITH PROPOFOL
Anesthesia: Monitor Anesthesia Care

## 2017-06-22 MED ORDER — DEXMEDETOMIDINE HCL 200 MCG/2ML IV SOLN
INTRAVENOUS | Status: DC | PRN
  Administered 2017-06-22: 12 ug via INTRAVENOUS
  Administered 2017-06-22: 8 ug via INTRAVENOUS

## 2017-06-22 MED ORDER — PROPOFOL 500 MG/50ML IV EMUL
INTRAVENOUS | Status: DC | PRN
  Administered 2017-06-22: 150 ug/kg/min via INTRAVENOUS

## 2017-06-22 MED ORDER — SODIUM CHLORIDE 0.9 % IV SOLN
INTRAVENOUS | Status: DC
  Administered 2017-06-22: 11:00:00 via INTRAVENOUS

## 2017-06-22 MED ORDER — PROPOFOL 10 MG/ML IV BOLUS
INTRAVENOUS | Status: DC | PRN
  Administered 2017-06-22 (×2): 20 mg via INTRAVENOUS

## 2017-06-22 MED FILL — Penicillin G Potassium Inj 60000 Unit/ML in Dextrose: INTRAVENOUS | Qty: 50 | Status: AC

## 2017-06-22 SURGICAL SUPPLY — 14 items

## 2017-06-22 NOTE — Care Management Note (Signed)
Case Management Note  Patient Details  Name: Nikolus Marczak MRN: [id number] Date of Birth: 1955-12-11  Subjective/Objective:      Admitted for  admitted on 2/20 for cellulitis of RLE and sepsis/group A strep bacteremia; Acute on chronic anemia requiring blood transfusion.  Action/Plan: 2/25 Endoscopy showed:  LA Grade A reflux esophagitis.Small hiatal hernia. Portal hypertensive gastropathy, mild and non-bleeding- Acute ulcerative duodenitis.  Expected Discharge Date:      06/23/17           Expected Discharge Plan:  Home/Self Care  Discharge planning Services  CM Consult  Status of Service:  In process, will continue to follow  If discussed at Long Length of Stay Meetings, dates discussed:    Additional Comments:  Kristen Cardinal, RN  Nurse case [place] 06/22/2017, 2:54 PM

## 2017-06-22 NOTE — Anesthesia Preprocedure Evaluation (Addendum)
Anesthesia Evaluation  Patient identified by MRN, date of birth, ID band Patient awake    Reviewed: Allergy & Precautions, H&P , NPO status , Patient's Chart, lab work & pertinent test results, reviewed documented beta blocker date and time   Airway Mallampati: III  TM Distance: >3 FB Neck ROM: Full    Dental no notable dental hx. (+) Edentulous Upper, Edentulous Lower, Dental Advisory Given   Pulmonary neg pulmonary ROS, former smoker,    Pulmonary exam normal breath sounds clear to auscultation       Cardiovascular hypertension, On Medications and On Home Beta Blockers  Rhythm:Regular Rate:Normal     Neuro/Psych negative neurological ROS  negative psych ROS   GI/Hepatic negative GI ROS, (+) Cirrhosis   ascites    ,   Endo/Other  negative endocrine ROS  Renal/GU Renal InsufficiencyRenal disease  negative genitourinary   Musculoskeletal  (+) Arthritis , Osteoarthritis,    Abdominal   Peds  Hematology negative hematology ROS (+) anemia ,   Anesthesia Other Findings   Reproductive/Obstetrics negative OB ROS                            Anesthesia Physical Anesthesia Plan  ASA: III  Anesthesia Plan: MAC   Post-op Pain Management:    Induction: Intravenous  PONV Risk Score and Plan: 1 and Propofol infusion  Airway Management Planned: Nasal Cannula  Additional Equipment:   Intra-op Plan:   Post-operative Plan:   Informed Consent: I have reviewed the patients History and Physical, chart, labs and discussed the procedure including the risks, benefits and alternatives for the proposed anesthesia with the patient or authorized representative who has indicated his/her understanding and acceptance.   Dental advisory given  Plan Discussed with: CRNA  Anesthesia Plan Comments:         Anesthesia Quick Evaluation

## 2017-06-22 NOTE — Interval H&P Note (Signed)
History and Physical Interval Note: 62 year old male with cirrhosis, secondary to alcohol, history of portal hypertension with small varices and portal gastropathy last seen by endoscopy in 2017 admitted with lower extremity cellulitis and sepsis related to the same.  Acute kidney injury felt secondary to hypoperfusion in the setting of sepsis. Acute on chronic anemia requiring blood transfusion; coffee-ground type emesis Status post transfusion, hemoglobin stable.  INR elevated around 1.4 when checked 5 days ago.  No recheck since.   Platelets are low in the setting of portal hypertension, also likely related to acute sepsis given there is been a significant drop in platelets since September 2018 CBC    Component Value Date/Time   WBC 14.7 (H) 06/22/2017 0715   RBC 2.81 (L) 06/22/2017 0715   HGB 8.8 (L) 06/22/2017 0715   HCT 24.4 (L) 06/22/2017 0715   PLT 47 (L) 06/22/2017 0715   MCV 86.8 06/22/2017 0715   MCH 31.3 06/22/2017 0715   MCHC 36.1 (H) 06/22/2017 0715   RDW 15.2 06/22/2017 0715   LYMPHSABS 2.5 06/22/2017 0715   MONOABS 0.7 06/22/2017 0715   EOSABS 0.0 06/22/2017 0715   BASOSABS 0.0 06/22/2017 0715   On PPI and octreotide infusion.  For upper endoscopy today to evaluate upper GI bleeding HIGHER THAN BASELINE RISK.The nature of the procedure, as well as the risks, benefits, and alternatives were carefully and thoroughly reviewed with the patient. Ample time for discussion and questions allowed. The patient understood, was satisfied, and agreed to proceed.     06/22/2017 11:11 AM  [name]  has presented today for surgery, with the diagnosis of Anemia, black stools, vomiting, cirrhosis, and history of varices  The various methods of treatment have been discussed with the patient and family. After consideration of risks, benefits and other options for treatment, the patient has consented to  Procedure(s): ESOPHAGOGASTRODUODENOSCOPY (EGD) WITH PROPOFOL (N/A) as a  surgical intervention .  The patient's history has been reviewed, patient examined, no change in status, stable for surgery.  I have reviewed the patient's chart and labs.  Questions were answered to the patient's satisfaction.     Lajuan Lines Jaelyne Deeg

## 2017-06-22 NOTE — Op Note (Signed)
[facility] Patient Name: Mark [name] Procedure Date : 06/22/2017 MRN: [id number] Attending MD: Jerene Bears , MD Date of Birth: 03/13/1956 CSN: [id number] Age: 62 Admit Type: Inpatient Procedure:                Upper GI endoscopy Indications:              Acute post hemorrhagic anemia on chronic anemia,                            Coffee-ground emesis, history of cirrhosis with                            portal hypertension Providers:                Lajuan Lines. Hilarie Fredrickson, MD, Burtis Junes, RN, Cherylynn Ridges,                            Technician, Edmonia Lovelle, CRNA Referring MD:             [facility] Medicines:                Monitored Anesthesia Care Complications:            No immediate complications. Estimated Blood Loss:     Estimated blood loss: none. Procedure:                Pre-Anesthesia Assessment:                           - Prior to the procedure, a History and Physical                            was performed, and patient medications and                            allergies were reviewed. The patient's tolerance of                            previous anesthesia was also reviewed. The risks                            and benefits of the procedure and the sedation                            options and risks were discussed with the patient.                            All questions were answered, and informed consent                            was obtained. Prior Anticoagulants: The patient has                            taken no previous anticoagulant or antiplatelet  agents. ASA Grade Assessment: III - A patient with                            severe systemic disease. After reviewing the risks                            and benefits, the patient was deemed in                            satisfactory condition to undergo the procedure.                           After obtaining informed consent, the endoscope was               passed under direct vision. Throughout the                            procedure, the patient's blood pressure, pulse, and                            oxygen saturations were monitored continuously. The                            EG-[id number] ([id number]) scope was introduced through the                            mouth, and advanced to the second part of duodenum.                            The upper GI endoscopy was accomplished without                            difficulty. The patient tolerated the procedure                            well. Scope In: Scope Out: Findings:      LA Grade A (one or more mucosal breaks less than 5 mm, not extending       between tops of 2 mucosal folds) esophagitis with no bleeding was found       at the gastroesophageal junction.      A small hiatal hernia was present.      There is no endoscopic evidence of varices in the distal esophagus or       proximal stomach.      Mild portal hypertensive gastropathy was found in the cardia and in the       gastric fundus. This was nonbleeding.      The exam of the stomach was otherwise normal.      Diffuse severe inflammation characterized by adherent blood, erosions,       erythema and shallow ulcerations was found in the duodenal bulb. This       area was lavaged and no visible vessel was identified, rather there was       friability with contact oozing without overt or brisk bleeding. No       target for endoscopic control of bleeding.  The second portion of the duodenum was normal. Impression:               - LA Grade A reflux esophagitis.                           - Small hiatal hernia.                           - Portal hypertensive gastropathy, mild and                            non-bleeding.                           - Acute ulcerative duodenitis (source of recent                            hematemesis and blood loss).                           - Normal second portion of the duodenum.                            - No specimens collected. Moderate Sedation:      N/A Recommendation:           - Patient has a contact number available for                            emergencies. The signs and symptoms of potential                            delayed complications were discussed with the                            patient. Return to normal activities tomorrow.                            Written discharge instructions were provided to the                            patient.                           - Low sodium diet.                           - Continue present medications. Can stop octreotide                            and PPI drips.                           - No aspirin, ibuprofen, naproxen, or other                            non-steroidal anti-inflammatory drugs.                           -  BID PPI for at least 8 weeks, daily thereafter.                           - Check H. Pylori Ab and treat if positive                            (treatment if needed can be done upon discharge).                           - Resume beta blocker (when appropriate given                            recent hypotension from sepsis) for portal                            hypertensive gastropathy. Procedure Code(s):        --- Professional ---                           [phone], Esophagogastroduodenoscopy, flexible,                            transoral; diagnostic, including collection of                            specimen(s) by brushing or washing, when performed                            (separate procedure) Diagnosis Code(s):        --- Professional ---                           K21.0, Gastro-esophageal reflux disease with                            esophagitis                           K44.9, Diaphragmatic hernia without obstruction or                            gangrene                           K76.6, Portal hypertension                           K31.89, Other diseases of stomach and duodenum                            K29.80, Duodenitis without bleeding                           D62, Acute posthemorrhagic anemia                           K92.0, Hematemesis CPT copyright 2016 American Medical Association. All rights  reserved. The codes documented in this report are preliminary and upon coder review may  be revised to meet current compliance requirements. Jerene Bears, MD 06/22/2017 12:09:24 PM This report has been signed electronically. Number of Addenda: 0

## 2017-06-22 NOTE — Anesthesia Postprocedure Evaluation (Signed)
Anesthesia Post Note  Patient: Mark [name]  Procedure(s) Performed: ESOPHAGOGASTRODUODENOSCOPY (EGD) WITH PROPOFOL (N/A )     Patient location during evaluation: PACU Anesthesia Type: MAC Level of consciousness: awake and alert Pain management: pain level controlled Vital Signs Assessment: post-procedure vital signs reviewed and stable Respiratory status: spontaneous breathing, nonlabored ventilation and respiratory function stable Cardiovascular status: stable and blood pressure returned to baseline Postop Assessment: no apparent nausea or vomiting Anesthetic complications: no    Last Vitals:  Vitals:   06/22/17 1204 06/22/17 1210  BP: (!) 94/43 108/84  Pulse: 64 92  Resp: 16 18  Temp: 36.9 C   SpO2: 99% 99%    Last Pain:  Vitals:   06/22/17 1204  TempSrc: Oral  PainSc:                  Anelia Carriveau,W. EDMOND

## 2017-06-22 NOTE — Anesthesia Procedure Notes (Signed)
Procedure Name: MAC Performed by: Valda Favia, CRNA Pre-anesthesia Checklist: Patient identified, Emergency Drugs available, Suction available, Patient being monitored and Timeout performed Patient Re-evaluated:Patient Re-evaluated prior to induction Oxygen Delivery Method: Nasal cannula Airway Equipment and Method: Bite block Placement Confirmation: positive ETCO2 Dental Injury: Teeth and Oropharynx as per pre-operative assessment

## 2017-06-22 NOTE — Plan of Care (Signed)
No emesis, nausea or diarrhea this shift, VSS, CIWA 0-4.

## 2017-06-22 NOTE — Evaluation (Signed)
Physical Therapy Evaluation Patient Details Name: Mark [name] MRN: [id number] DOB: 19-Aug-1955 Today's Date: 06/22/2017   History of Present Illness  62yo male presenting with generalized fatigue, nausea, vomiting, R LE swelling, flu like symptoms. Admitted with severe dehydration and sepsis, acute on chronic kidney failure. PMH alcohol abuse, cirrhosis, heart murmur, HTN, LE swelling, hx MI, hx CVA, hx hernia repair, B knee arthroscopy, hx TKR   Clinical Impression   Patient received in bed, pleasant and willing to work with skilled PT services. Of note, his R ankle and foot continue to be edematous and warm with some non-weeping skin tears noted today. He is able to complete functional mobility with S to Min guard for safety, but was limited by fatigue during gait distance this afternoon. He does require assistance donning socks and pants/underwear over R foot/LE, as such OT evaluation requested. He was left sitting at EOB with all needs met, alarm set, family present this afternoon. Moving forward he may benefit from skilled HHPT services to further address functional deficits.    Follow Up Recommendations Home health PT    Equipment Recommendations  Rolling walker with 5" wheels    Recommendations for Other Services       Precautions / Restrictions Precautions Precautions: Fall Restrictions Weight Bearing Restrictions: No      Mobility  Bed Mobility Overal bed mobility: Needs Assistance Bed Mobility: Supine to Sit;Sit to Supine     Supine to sit: Supervision Sit to supine: Supervision   General bed mobility comments: increased time and effort   Transfers Overall transfer level: Needs assistance Equipment used: Rolling walker (2 wheeled) Transfers: Sit to/from Stand Sit to Stand: Min guard         General transfer comment: close min guard and cues for safety and sequencing as patient tends up pull up on walker rather than pushing up from bed    Ambulation/Gait Ambulation/Gait assistance: Min guard Ambulation Distance (Feet): 60 Feet Assistive device: Rolling walker (2 wheeled) Gait Pattern/deviations: Step-through pattern;Decreased step length - right;Decreased step length - left;Decreased stride length;Trunk flexed     General Gait Details: gait distance limited by fatigue this afternoon, patient does not state he is tired but does demonstrates physical signs of effort/exertion   Stairs            Wheelchair Mobility    Modified Rankin (Stroke Patients Only)       Balance Overall balance assessment: Needs assistance Sitting-balance support: Bilateral upper extremity supported;Feet supported Sitting balance-[name]: Good     Standing balance support: Bilateral upper extremity supported;During functional activity Standing balance-[name]: Fair                               Pertinent Vitals/Pain Pain Assessment: 0-10 Pain Score: 5  Pain Location: R ankle  Pain Descriptors / Indicators: Aching;Sore Pain Intervention(s): Limited activity within patient's tolerance;Monitored during session    Carl expects to be discharged to:: Private residence Living Arrangements: Spouse/significant other Available Help at Discharge: Family Type of Home: House Home Access: Stairs to enter Entrance Stairs-Rails: None Entrance Stairs-Number of Steps: 2 Home Layout: One level Home Equipment: Environmental consultant - 2 wheels;Cane - single point      Prior Function Level of Independence: Independent               Hand Dominance        Extremity/Trunk Assessment   Upper Extremity Assessment Upper  Extremity Assessment: Defer to OT evaluation    Lower Extremity Assessment Lower Extremity Assessment: Generalized weakness    Cervical / Trunk Assessment Cervical / Trunk Assessment: Kyphotic  Communication   Communication: No difficulties  Cognition Arousal/Alertness:  Awake/alert Behavior During Therapy: WFL for tasks assessed/performed Overall Cognitive Status: Within Functional Limits for tasks assessed                                        General Comments      Exercises     Assessment/Plan    PT Assessment Patient needs continued PT services  PT Problem List Decreased strength;Decreased mobility;Decreased safety awareness;Decreased coordination;Decreased knowledge of precautions;Decreased activity tolerance;Decreased balance;Decreased knowledge of use of DME       PT Treatment Interventions DME instruction;Therapeutic activities;Gait training;Therapeutic exercise;Patient/family education;Stair training;Balance training;Functional mobility training;Neuromuscular re-education    PT Goals (Current goals can be found in the Care Plan section)  Acute Rehab PT Goals Patient Stated Goal: to go home  PT Goal Formulation: With patient Time For Goal Achievement: 07/06/17 Potential to Achieve Goals: Good    Frequency Min 3X/week   Barriers to discharge        Co-evaluation               AM-PAC PT "6 Clicks" Daily Activity  Outcome Measure Difficulty turning over in bed (including adjusting bedclothes, sheets and blankets)?: Unable Difficulty moving from lying on back to sitting on the side of the bed? : Unable Difficulty sitting down on and standing up from a chair with arms (e.g., wheelchair, bedside commode, etc,.)?: Unable Help needed moving to and from a bed to chair (including a wheelchair)?: A Little Help needed walking in hospital room?: A Little Help needed climbing 3-5 steps with a railing? : A Little 6 Click Score: 12    End of Session Equipment Utilized During Treatment: Gait belt Activity Tolerance: Patient tolerated treatment well Patient left: in bed;with family/visitor present;with call bell/phone within reach;with bed alarm set(sitting at EOB per his request )   PT Visit Diagnosis: Unsteadiness on  feet (R26.81);Muscle weakness (generalized) (M62.81);Difficulty in walking, not elsewhere classified (R26.2)    Time: 0814-4818 PT Time Calculation (min) (ACUTE ONLY): 29 min   Charges:   PT Evaluation $PT Eval Low Complexity: 1 Low PT Treatments $Gait Training: 8-22 mins   PT G Codes:        Deniece Ree PT, DPT, CBIS  Supplemental Physical Therapist [place]   Pager [phone]

## 2017-06-22 NOTE — Transfer of Care (Signed)
Immediate Anesthesia Transfer of Care Note  Patient: Mark [name]  Procedure(s) Performed: ESOPHAGOGASTRODUODENOSCOPY (EGD) WITH PROPOFOL (N/A )  Patient Location: Endoscopy Unit  Anesthesia Type:MAC  Level of Consciousness: drowsy  Airway & Oxygen Therapy: Patient Spontanous Breathing and Patient connected to nasal cannula oxygen  Post-op Assessment: Report given to RN and Post -op Vital signs reviewed and stable  Post vital signs: Reviewed and stable  Last Vitals:  Vitals:   06/22/17 1035 06/22/17 1204  BP: (!) 151/73 (!) 94/43  Pulse:  64  Resp: 14 16  Temp: 36.4 C 36.9 C  SpO2: 100% 99%    Last Pain:  Vitals:   06/22/17 1204  TempSrc: Oral  PainSc:       Patients Stated Pain Goal: 0 (11/17/55 5051)  Complications: No apparent anesthesia complications

## 2017-06-22 NOTE — Progress Notes (Signed)
PROGRESS NOTE  Kailash Hinze QIH:[id number] DOB: 17-Jan-1956 DOA: 06/17/2017 PCP: [name], MD   LOS: 5 days   Brief Narrative / Interim history: This is a 62 year old male with history of alcoholic cirrhosis, ongoing alcohol abuse, history of esophageal varices, chronic anemia, hypertension, coronary artery disease with prior MI, thrombocytopenia, who presents with generalized weakness, nausea vomiting and diarrhea for the past few days.   -Found to have severe sepsis, group A strep bacteremia and hypotension, acute kidney injury was briefly in was briefly in the ICU 48 hours, did not have pressors, -Transferred from critical care to [facility] service today 2/23  Assessment & Plan:  Severe sepsis/group A strep bacteremia -Secondary to right lower extremity cellulitis -continue IV penicillin G, completed 48hours of clindamycin then discontinued - repeat blood cultures pending - Once repeat cultures negative to transition to oral antibiotics -I discussed case with infectious disease, will plan total 14day course  Upper GI bleed /acute blood loss anemia -Hemoglobin dropped by 2 g yesterday -Given alcoholic cirrhosis,  needs endoscopy, last EGD in 2017 with grade 1 varices -continue IV PPI and octreotide -[facility] consulted, plan for EGD today  Acute blood loss anemia  -As above, transfused 2 units of PRBC 2/24  Right lower extremity cellulitis -Antibiotics as discussed above  Alcoholic cirrhosis of the liver -Patient being followed as an outpatient by Dr. Ardis Hughs, he unfortunately continues to drink about 1 - 40 ounce beer per day -Continue lactulose -Hold spironolactone, furosemide -continue nadolol  Acute kidney injury on chronic kidney disease stage II-III -Baseline creatinine around 1.1-1.2, creatinine 6.5 on admission, -Most likely secondary to sepsis and hypotension -Creatinine continues to trend down, stopped IV fluids, s/p Blood    Hyponatremia -Likely in the setting of dehydration, improved  Thrombocytopenia -Likely in the setting of liver disease, no bleeding, monitor  Iron deficiency anemia -No bleeding, continue to monitor  DVT prophylaxis: SCDs Code Status: Full code Family Communication: wife at bedside Disposition Plan: May need SNF, will consult PT and OT  Consultants:   PCCM  Nephrology   Procedures:   None   Antimicrobials:  Vancomycin 2/20 >> 2/21  Zosyn 2/20 >> 2/21  Penicillin 2/21 >>  Clindamycin 2/21 >>   Subjective: -had more dark stools yesterday  Objective: Vitals:   06/22/17 0821 06/22/17 1035 06/22/17 1204 06/22/17 1210  BP: 128/65 (!) 151/73 (!) 94/43 108/84  Pulse: 68  64 92  Resp: 18 14 16 18   Temp: (!) 97.5 F (36.4 C) 97.6 F (36.4 C) 98.4 F (36.9 C)   TempSrc: Oral Oral Oral   SpO2: 98% 100% 99% 99%  Weight:  70.8 kg (156 lb)    Height:  5' 6.5" (1.689 m)      Intake/Output Summary (Last 24 hours) at 06/22/2017 1525 Last data filed at 06/22/2017 8756 Gross per 24 hour  Intake 1620.75 ml  Output 1375 ml  Net 245.75 ml   Filed Weights   06/21/17 0521 06/21/17 2040 06/22/17 1035  Weight: 71.4 kg (157 lb 6.5 oz) 71.2 kg (156 lb 15.5 oz) 70.8 kg (156 lb)    Examination: Gen: Awake, Alert, Oriented X 2, no distress HEENT: PERRLA, no JVD Lungs: decreased BS at bases CVS: S1S2/RRR Abd: soft, Non tender, non distended, BS present Extremities: R leg with edema, less tenderness Skin: no new rashes Neuro: Moves all extremities no localising signs + asterixis    Data Reviewed: I have independently reviewed following labs and imaging studies   CXR -  no infiltrates EKG - sinus rhyhtm  CBC: Recent Labs  Lab 06/19/17 0320 06/20/17 0757 06/21/17 0724 06/21/17 0945 06/22/17 0715  WBC 14.9* 19.5* 17.5* 15.8* 14.7*  NEUTROABS  --   --   --   --  11.5*  HGB 9.2* 8.5* 6.6* 6.7* 8.8*  HCT 25.2* 23.3* 18.2* 20.1* 24.4*  MCV 91.0 91.0 89.7 102.6*  86.8  PLT 50* 44* 50* 69* 47*   Basic Metabolic Panel: Recent Labs  Lab 06/17/17 2024 06/18/17 0927 06/19/17 0320 06/20/17 0757 06/21/17 0724 06/22/17 0715  NA  --  130* 134* 137 140 141  K  --  3.6 4.0 3.7 3.5 4.2  CL  --  106 111 114* 117* 118*  CO2  --  12* 13* 13* 15* 17*  GLUCOSE  --  81 59* 85 127* 96  BUN  --  67* 63* 55* 51* 52*  CREATININE  --  4.50* 3.35* 2.27* 1.66* 1.48*  CALCIUM  --  6.2* 6.1* 6.8* 7.0* 7.3*  MG 1.1* 1.5*  --   --   --   --   PHOS 6.1* 5.2*  --   --   --   --    GFR: Estimated Creatinine Clearance: 47 mL/min (A) (by C-G formula based on SCr of 1.48 mg/dL (H)). Liver Function Tests: Recent Labs  Lab 06/17/17 1525 06/18/17 0927 06/21/17 0724  AST 72* 69* 83*  ALT 24 21 24   ALKPHOS 63 45 136*  BILITOT 2.1* 2.2* 1.7*  PROT 6.3* 4.8* 4.8*  ALBUMIN 2.3* 1.8* 1.4*   Recent Labs  Lab 06/17/17 1525  LIPASE 23   Recent Labs  Lab 06/17/17 2124  AMMONIA 114*   Coagulation Profile: Recent Labs  Lab 06/17/17 2024  INR 1.38   Cardiac Enzymes: Recent Labs  Lab 06/17/17 2024  CKTOTAL 75   BNP (last 3 results) No results for input(s): PROBNP in the last 8760 hours. HbA1C: No results for input(s): HGBA1C in the last 72 hours. CBG: Recent Labs  Lab 06/20/17 0459 06/20/17 0817 06/20/17 1133 06/20/17 1703 06/20/17 2113  GLUCAP 97 92 80 116* 123*   Lipid Profile: No results for input(s): CHOL, HDL, LDLCALC, TRIG, CHOLHDL, LDLDIRECT in the last 72 hours. Thyroid Function Tests: No results for input(s): TSH, T4TOTAL, FREET4, T3FREE, THYROIDAB in the last 72 hours. Anemia Panel: No results for input(s): VITAMINB12, FOLATE, FERRITIN, TIBC, IRON, RETICCTPCT in the last 72 hours. Urine analysis:    Component Value Date/Time   COLORURINE AMBER (A) 06/17/2017 1649   APPEARANCEUR HAZY (A) 06/17/2017 1649   LABSPEC 1.013 06/17/2017 1649   PHURINE 5.0 06/17/2017 1649   GLUCOSEU NEGATIVE 06/17/2017 1649   HGBUR SMALL (A) 06/17/2017  1649   BILIRUBINUR NEGATIVE 06/17/2017 1649   KETONESUR NEGATIVE 06/17/2017 1649   PROTEINUR NEGATIVE 06/17/2017 1649   UROBILINOGEN 2.0 (H) 04/26/2013 0945   NITRITE NEGATIVE 06/17/2017 1649   LEUKOCYTESUR TRACE (A) 06/17/2017 1649   Sepsis Labs: Invalid input(s): PROCALCITONIN, LACTICIDVEN  Recent Results (from the past 240 hour(s))  Blood culture (routine x 2)     Status: Abnormal   Collection Time: 06/17/17  5:09 PM  Result Value Ref Range Status   Specimen Description BLOOD RIGHT ANTECUBITAL  Final   Special Requests   Final    BOTTLES DRAWN AEROBIC AND ANAEROBIC Blood Culture adequate volume   Culture  Setup Time   Final    GRAM POSITIVE COCCI IN BOTH AEROBIC AND ANAEROBIC BOTTLES CRITICAL RESULT CALLED TO, READ  BACK BY AND VERIFIED WITH: K PATTON PHARMD 06/18/17 0735 JDW    Culture (A)  Final    GROUP A STREP (S.PYOGENES) ISOLATED HEALTH DEPARTMENT NOTIFIED Performed at [facility], [address] [place], [place] [address]    Report Status 06/20/2017 FINAL  Final   Organism ID, Bacteria GROUP A STREP (S.PYOGENES) ISOLATED  Final      Susceptibility   Group a strep (s.pyogenes) isolated - MIC*    PENICILLIN <=0.06 SENSITIVE Sensitive     CEFTRIAXONE <=0.12 SENSITIVE Sensitive     ERYTHROMYCIN <=0.12 SENSITIVE Sensitive     LEVOFLOXACIN 0.5 SENSITIVE Sensitive     VANCOMYCIN <=0.12 SENSITIVE Sensitive     * GROUP A STREP (S.PYOGENES) ISOLATED  Blood culture (routine x 2)     Status: Abnormal   Collection Time: 06/17/17  5:26 PM  Result Value Ref Range Status   Specimen Description BLOOD LEFT ANTECUBITAL  Final   Special Requests   Final    BOTTLES DRAWN AEROBIC AND ANAEROBIC Blood Culture adequate volume   Culture  Setup Time   Final    GRAM POSITIVE COCCI IN BOTH AEROBIC AND ANAEROBIC BOTTLES CRITICAL RESULT CALLED TO, READ BACK BY AND VERIFIED WITH: K PATTON PHARMD 06/18/17 0735 JDW    Culture (A)  Final    GROUP A STREP (S.PYOGENES)  ISOLATED SUSCEPTIBILITIES PERFORMED ON PREVIOUS CULTURE WITHIN THE LAST 5 DAYS. HEALTH DEPARTMENT NOTIFIED Performed at [facility], [place] [address] [place], [place] [address]    Report Status 06/20/2017 FINAL  Final  Blood Culture ID Panel (Reflexed)     Status: Abnormal   Collection Time: 06/17/17  5:26 PM  Result Value Ref Range Status   Enterococcus species NOT DETECTED NOT DETECTED Final   Listeria monocytogenes NOT DETECTED NOT DETECTED Final   Staphylococcus species NOT DETECTED NOT DETECTED Final   Staphylococcus aureus NOT DETECTED NOT DETECTED Final   Streptococcus species DETECTED (A) NOT DETECTED Final    Comment: CRITICAL RESULT CALLED TO, READ BACK BY AND VERIFIED WITH: K PATTON PHARMD 06/18/17 0735 JDW    Streptococcus agalactiae NOT DETECTED NOT DETECTED Final   Streptococcus pneumoniae NOT DETECTED NOT DETECTED Final   Streptococcus pyogenes DETECTED (A) NOT DETECTED Final    Comment: CRITICAL RESULT CALLED TO, READ BACK BY AND VERIFIED WITH: K PATTON PHARMD 06/18/17 0735 JDW    Acinetobacter baumannii NOT DETECTED NOT DETECTED Final   Enterobacteriaceae species NOT DETECTED NOT DETECTED Final   Enterobacter cloacae complex NOT DETECTED NOT DETECTED Final   Escherichia coli NOT DETECTED NOT DETECTED Final   Klebsiella oxytoca NOT DETECTED NOT DETECTED Final   Klebsiella pneumoniae NOT DETECTED NOT DETECTED Final   Proteus species NOT DETECTED NOT DETECTED Final   Serratia marcescens NOT DETECTED NOT DETECTED Final   Haemophilus influenzae NOT DETECTED NOT DETECTED Final   Neisseria meningitidis NOT DETECTED NOT DETECTED Final   Pseudomonas aeruginosa NOT DETECTED NOT DETECTED Final   Candida albicans NOT DETECTED NOT DETECTED Final   Candida glabrata NOT DETECTED NOT DETECTED Final   Candida krusei NOT DETECTED NOT DETECTED Final   Candida parapsilosis NOT DETECTED NOT DETECTED Final   Candida tropicalis NOT DETECTED NOT DETECTED Final    Comment:  Performed at [facility], [place] [address], [place], [place] [address]  Urine Culture     Status: None   Collection Time: 06/17/17 10:04 PM  Result Value Ref Range Status   Specimen Description URINE, CLEAN  CATCH  Final   Special Requests NONE  Final   Culture   Final    NO GROWTH Performed at [facility], [place] [address] [place], [place] [address]    Report Status 06/19/2017 FINAL  Final  Gastrointestinal Panel by PCR , Stool     Status: None   Collection Time: 06/18/17  6:41 PM  Result Value Ref Range Status   Campylobacter species NOT DETECTED NOT DETECTED Final   Plesimonas shigelloides NOT DETECTED NOT DETECTED Final   Salmonella species NOT DETECTED NOT DETECTED Final   Yersinia enterocolitica NOT DETECTED NOT DETECTED Final   Vibrio species NOT DETECTED NOT DETECTED Final   Vibrio cholerae NOT DETECTED NOT DETECTED Final   Enteroaggregative E coli (EAEC) NOT DETECTED NOT DETECTED Final   Enteropathogenic E coli (EPEC) NOT DETECTED NOT DETECTED Final   Enterotoxigenic E coli (ETEC) NOT DETECTED NOT DETECTED Final   Shiga like toxin producing E coli (STEC) NOT DETECTED NOT DETECTED Final   Shigella/Enteroinvasive E coli (EIEC) NOT DETECTED NOT DETECTED Final   Cryptosporidium NOT DETECTED NOT DETECTED Final   Cyclospora cayetanensis NOT DETECTED NOT DETECTED Final   Entamoeba histolytica NOT DETECTED NOT DETECTED Final   Giardia lamblia NOT DETECTED NOT DETECTED Final   Adenovirus F40/41 NOT DETECTED NOT DETECTED Final   Astrovirus NOT DETECTED NOT DETECTED Final   Norovirus GI/GII NOT DETECTED NOT DETECTED Final   Rotavirus A NOT DETECTED NOT DETECTED Final   Sapovirus (I, II, IV, and V) NOT DETECTED NOT DETECTED Final    Comment: Performed at [facility], [address] [place], [place] [address]  MRSA PCR Screening     Status: Abnormal   Collection Time: 06/18/17  8:10 PM  Result Value Ref Range Status   MRSA by PCR POSITIVE (A) NEGATIVE Final     Comment:        The GeneXpert MRSA Assay (FDA approved for NASAL specimens only), is one component of a comprehensive MRSA colonization surveillance program. It is not intended to diagnose MRSA infection nor to guide or monitor treatment for MRSA infections. RESULT CALLED TO, READ BACK BY AND VERIFIED WITH: Z.HASSAN,RN AT 0043 06/19/17 L.PITT       Radiology Studies: No results found.   Scheduled Meds: . Chlorhexidine Gluconate Cloth  6 each Topical Q0600  . folic acid  1 mg Oral Daily  . lactulose  20 g Oral TID  . multivitamin with minerals  1 tablet Oral Once  . mupirocin ointment  1 application Nasal BID  . nadolol  10 mg Oral Daily  . pantoprazole (PROTONIX) IV  40 mg Intravenous Q12H  . thiamine  100 mg Oral Daily   Continuous Infusions: . pencillin G potassium IV Stopped (06/22/17 1348)    Domenic Polite, MD [facility] Page via [email or web address] password [email or web address]  If 7PM-7AM, please contact night-coverage  06/22/2017, 3:25 PM

## 2017-06-22 NOTE — Progress Notes (Signed)
PT Cancellation Note  Patient Details Name: Mark [name] MRN: [id number] DOB: [birth year]   Cancelled Treatment:    Reason Eval/Treat Not Completed: Medical issues which prohibited therapy Spoke to RN, who reports patient has been bleeding from unknown source, he is going for imaging to further investigate this situation this morning. RN requests that PT hold off until this afternoon due to patient status.   PT hold for this morning, plan to attempt to return as/if able and schedule allows.    Deniece Ree PT, DPT, CBIS  Supplemental Physical Therapist [facility]   Pager [phone]

## 2017-06-23 DIAGNOSIS — N19 Unspecified kidney failure: Secondary | ICD-10-CM

## 2017-06-23 DIAGNOSIS — K269 Duodenal ulcer, unspecified as acute or chronic, without hemorrhage or perforation: Secondary | ICD-10-CM

## 2017-06-23 DIAGNOSIS — K209 Esophagitis, unspecified: Secondary | ICD-10-CM

## 2017-06-23 LAB — BASIC METABOLIC PANEL
Anion gap: 6 (ref 5–15)
BUN: 37 mg/dL — ABNORMAL HIGH (ref 6–20)
CO2: 17 mmol/L — ABNORMAL LOW (ref 22–32)
Calcium: 7.5 mg/dL — ABNORMAL LOW (ref 8.9–10.3)
Chloride: 121 mmol/L — ABNORMAL HIGH (ref 101–111)
Creatinine, Ser: 1.29 mg/dL — ABNORMAL HIGH (ref 0.61–1.24)
GFR calc Af Amer: >60 mL/min (ref >60)
GFR calc non Af Amer: 57 mL/min — ABNORMAL LOW (ref >60)
Glucose, Bld: 88 mg/dL (ref 65–99)
Potassium: 4.1 mmol/L (ref 3.5–5.1)
Sodium: 144 mmol/L (ref 135–145)

## 2017-06-23 LAB — AMMONIA: Ammonia: 19 umol/L (ref 9–35)

## 2017-06-23 LAB — CBC
HCT: 23.9 % — ABNORMAL LOW (ref 39.0–52.0)
Hemoglobin: 8.3 g/dL — ABNORMAL LOW (ref 13.0–17.0)
MCH: 31.1 pg (ref 26.0–34.0)
MCHC: 34.7 g/dL (ref 30.0–36.0)
MCV: 89.5 fL (ref 78.0–100.0)
Platelets: 68 10*3/uL — ABNORMAL LOW (ref 150–400)
RBC: 2.67 MIL/uL — ABNORMAL LOW (ref 4.22–5.81)
RDW: 15.7 % — ABNORMAL HIGH (ref 11.5–15.5)
WBC: 10 10*3/uL (ref 4.0–10.5)

## 2017-06-23 MED ORDER — LACTULOSE 10 GM/15ML PO SOLN
30.0000 g | Freq: Three times a day (TID) | ORAL | Status: DC
  Administered 2017-06-23 – 2017-06-27 (×14): 30 g via ORAL
  Filled 2017-06-23 (×14): qty 45

## 2017-06-23 MED ORDER — PENICILLIN G POTASSIUM 5000000 UNITS IJ SOLR
3.0000 10*6.[IU] | INTRAVENOUS | Status: DC
  Administered 2017-06-23 – 2017-06-25 (×10): 3 10*6.[IU] via INTRAVENOUS
  Filled 2017-06-23 (×12): qty 3

## 2017-06-23 MED FILL — Penicillin G Potassium Inj 60000 Unit/ML in Dextrose: INTRAVENOUS | Qty: 50 | Status: AC

## 2017-06-23 NOTE — Care Management Note (Addendum)
Case Management Note  Patient Details  Name: Eryc Bodey MRN: [id number] Date of Birth: Oct 12, 1955  Subjective/Objective:  Admitted for  admitted on 2/20 for cellulitis of RLE and sepsis/group A strep bacteremia; Acute on chronic anemia requiring blood transfusion.  Action/Plan: 2/25 Endoscopy showed:  LA Grade A reflux esophagitis.Small hiatal hernia. Portal hypertensive gastropathy, mild and non-bleeding- Acute ulcerative duodenitis.  Expected Discharge Date:    TBD              Expected Discharge Plan:  [place]  In-House Referral:  NA  Discharge planning Services  CM Consult  Post Acute Care Choice:  Home Health Choice offered to:  Spouse  Status of Service:  In process, will continue to follow  Additional Comments: Call placed to wife at [phone], patient information verified.  Veified PCP Dr. Volanda Napoleon and Patient uses [facility].  Discussed recommendations regarding recommendations for [facility] PT/OT.  Wife agrees with recommendations for [facility] PT/OT, has used [facility] in the past but could not remember the name.  Offered list of available [employer]; wife requested that I leave list in patient room, she will be back this evening, her/daughter will take a look and let NCM know in the am their choice.   Patient will be returning to the same living situation after discharge.  Patient has the ability to pay for your medication.  Home DME:  Cane x 2, bedside commode and wife thinks they have walker; stated she will check on walker. Wife request tub seat, NCM advised this item would be a private pay item; no further need to address.  Denies inability to afford medications or food.  Denies transportation issue taking patient to medical appointments.  Kristen Cardinal, RN  Nurse Case Manager [place]  06/23/2017, 2:13 PM

## 2017-06-23 NOTE — Progress Notes (Signed)
Daily Rounding Note  06/23/2017, 10:04 AM  LOS: 6 days   SUBJECTIVE:   Chief complaint:  Agitated and confused overnight, treated with Ativan and somewhat obtunded this AM.  No bloody, black stools or emesis reported.  Last recorded BM of 2/25 was brown.      At least 9 days out from last ETOH so beyond withdrawal window.    OBJECTIVE:         Vital signs in last 24 hours:    Temp:  [97.6 F (36.4 C)-98.6 F (37 C)] 97.6 F (36.4 C) (02/26 0745) Pulse Rate:  [58-92] 66 (02/26 0745) Resp:  [14-18] 18 (02/26 0745) BP: (94-156)/(43-84) 144/73 (02/26 0745) SpO2:  [99 %-100 %] 100 % (02/26 0745) Weight:  [156 lb (70.8 kg)-160 lb 4.4 oz (72.7 kg)] 160 lb 4.4 oz (72.7 kg) (02/25 2130) Last BM Date: 06/22/17 Filed Weights   06/21/17 2040 06/22/17 1035 06/22/17 2130  Weight: 156 lb 15.5 oz (71.2 kg) 156 lb (70.8 kg) 160 lb 4.4 oz (72.7 kg)   General: chronically ill looking, confused, malnourished AAM   Heart: RRR Chest: no labored breathing Abdomen: NT, active BS, soft, ND  Extremities: swelling and wounds on right LE Neuro/Psych:  Not oriented, follows commands.  Unable to elicit asterixis.    Intake/Output from previous day: 02/25 0701 - 02/26 0700 In: 983.8 [P.O.:440; I.V.:243.8; IV Piggyback:300] Out: 1225 [Urine:1225]  Intake/Output this shift: No intake/output data recorded.  Lab Results: Recent Labs    06/21/17 0724 06/21/17 0945 06/22/17 0715  WBC 17.5* 15.8* 14.7*  HGB 6.6* 6.7* 8.8*  HCT 18.2* 20.1* 24.4*  PLT 50* 69* 47*   BMET Recent Labs    06/21/17 0724 06/22/17 0715  NA 140 141  K 3.5 4.2  CL 117* 118*  CO2 15* 17*  GLUCOSE 127* 96  BUN 51* 52*  CREATININE 1.66* 1.48*  CALCIUM 7.0* 7.3*   LFT Recent Labs    06/21/17 0724  PROT 4.8*  ALBUMIN 1.4*  AST 83*  ALT 24  ALKPHOS 136*  BILITOT 1.7*   Scheduled Meds: . Chlorhexidine Gluconate Cloth  6 each Topical Q0600  . folic  acid  1 mg Oral Daily  . lactulose  30 g Oral TID  . mupirocin ointment  1 application Nasal BID  . nadolol  10 mg Oral Daily  . pantoprazole (PROTONIX) IV  40 mg Intravenous Q12H  . thiamine  100 mg Oral Daily   Continuous Infusions: . pencillin G potassium IV Stopped (06/23/17 0946)   PRN Meds:.acetaminophen **OR** acetaminophen, ondansetron **OR** ondansetron (ZOFRAN) IV   ASSESMENT:   *  Black emesis. ABL on top of chronic anemia.    2/25 EGD: mild esophagitis, small HH, portal hypertensive gastropathy, ulcerative duodenitis (this is source of bleeding and acute anemia).  No varices as seen in 01/2016.   On IV BID Protonix   *  ABL anemia on top of chronic anemia.  S/p 2 U PRBC on 2/24 with good response.    *  Cirrhosis of liver,  Secondary to ETOH.  Not sober/abstinent.  Also uses illicit substances.   Viral serologies negative.    *  HE with asterixis, on TID Lactulose.  AMS after procedures yesterday.  Suspect med effects on CNS.    *  Thrombocytopenia.     *  Right LE cellulitis with group A strep bacteremia.  Clinda >> Pcn G.     PLAN   *  BID PPI for at least 8 weeks, daily thereafter.    *  Continue Nadolol 10 mg daily currently, increase to 20 mg daily (home dose) as BP allows.    *  awating results of CBC, BMET and H Pylori Ab, yet to be collected today (he is difficult stick).   If H Pylori +, will need abx eradication.    *  Will arrange GI ROV in several weeks with APP or Dr Ardis Hughs.         Azucena Freed  06/23/2017, 10:04 AM Pager: [phone]

## 2017-06-23 NOTE — Progress Notes (Signed)
PROGRESS NOTE  Mark [name] JSE:[id number] DOB: Jun 03, 1955 DOA: 06/17/2017 PCP: [name], MD   LOS: 6 days   Brief Narrative / Interim history: This is a 62 year old male with history of advanced alcoholic cirrhosis, ongoing alcohol abuse, history of esophageal varices, chronic anemia, hypertension, coronary artery disease with prior MI, thrombocytopenia, severe hypoalbuminemia who presented with generalized weakness, nausea vomiting and right leg swelling  -Found to have severe sepsis, cellulitis, group A strep bacteremia and hypotension, acute kidney injury was briefly in was briefly in the ICU 48 hours, did not have pressors, -Transferred [facility] critical care to [facility] 2/23 -over the weekend developed active upper GI bleed requiring IV PPI, 2 units of PRBC and endoscopy 2/25 which noted ulcerative duodenitis -2/26 AM agitated got Ativan 2 doses  Assessment & Plan:  Severe sepsis/group A strep bacteremia -Secondary to right lower extremity cellulitis -continue IV penicillin G, completed 48hours of clindamycin then discontinued - repeat blood cultures negative - Can transition to transition to oral antibiotics at discharge -I discussed case with infectious disease Dr.Snider, plan total 14day course  Upper GI bleed /acute blood loss anemia -Hemoglobin dropped by 2 g over weekend and given PRBC x2 -EGD 2/25 with ulcerative duodenitis, continue PPI, monitor Hb, stop octreotide - gastroenterology following  Encephalopathy -this am, likley due to ATivan x2 doses this am and decreased clearance with cirrhosis -check ammonia, continue lactulose -out of window for withdrawals  Acute blood loss anemia  -As above, transfused 2 units of PRBC 2/24 -stable, monitor  Right lower extremity cellulitis -Antibiotics as discussed above  Alcoholic cirrhosis of the liver -Patient being followed as an outpatient by Dr. Ardis Hughs, he unfortunately continues to drink about  1 - 40 ounce beer per day -Continue lactulose -Hold spironolactone, furosemide -continue nadolol  Acute kidney injury on chronic kidney disease stage II-III -Baseline creatinine around 1.1-1.2, creatinine 6.5 on admission, -Most likely secondary to sepsis and hypotension -Creatinine continues to trend down, stopped IV fluids, s/p Blood   Hyponatremia -Likely in the setting of dehydration, improved  Thrombocytopenia -Likely in the setting of liver disease, no bleeding, monitor  DVT prophylaxis: SCDs Code Status: Full code Family Communication: wife at bedside Disposition Plan: May need SNF  Consultants:   PCCM  Nephrology   Procedures:   None   Antimicrobials:  Vancomycin 2/20 >> 2/21  Zosyn 2/20 >> 2/21  Penicillin 2/21 >>  Clindamycin 2/21 >>   Subjective: -Confused and agitated since overnight, received Ativan 2 doses around 4 and 4:30 this morning, has been drowsy ever sense  Objective: Vitals:   06/22/17 1745 06/22/17 2130 06/23/17 0427 06/23/17 0745  BP: (!) 149/63 (!) 156/62 (!) 148/63 (!) 144/73  Pulse: (!) 58 65 70 66  Resp: 18 18 18 18   Temp: 98.6 F (37 C) 97.7 F (36.5 C) 97.8 F (36.6 C) 97.6 F (36.4 C)  TempSrc: Oral Oral Axillary Oral  SpO2: 99% 100% 100% 100%  Weight:  72.7 kg (160 lb 4.4 oz)    Height:        Intake/Output Summary (Last 24 hours) at 06/23/2017 1513 Last data filed at 06/23/2017 0700 Gross per 24 hour  Intake 983.75 ml  Output 850 ml  Net 133.75 ml   Filed Weights   06/21/17 2040 06/22/17 1035 06/22/17 2130  Weight: 71.2 kg (156 lb 15.5 oz) 70.8 kg (156 lb) 72.7 kg (160 lb 4.4 oz)    Examination:  Gen: somnolent, arousable, disoriented today HEENT: PERRLA, Neck supple,  no JVD Lungs: decreased breath sounds at the bases  CVS: RRR,No Murmurs Abd: soft, Non tender, non distended, BS present Extremities: right leg edema, improving, few fluid-filled blisters noted  Skin: Blisters in the right leg, some have  ruptured  Neuro:  drowsy,Moves all extremities no localising signs + asterixis    Data Reviewed: I have independently reviewed following labs and imaging studies   CXR - no infiltrates EKG - sinus rhyhtm  CBC: Recent Labs  Lab 06/20/17 0757 06/21/17 0724 06/21/17 0945 06/22/17 0715 06/23/17 1013  WBC 19.5* 17.5* 15.8* 14.7* 10.0  NEUTROABS  --   --   --  11.5*  --   HGB 8.5* 6.6* 6.7* 8.8* 8.3*  HCT 23.3* 18.2* 20.1* 24.4* 23.9*  MCV 91.0 89.7 102.6* 86.8 89.5  PLT 44* 50* 69* 47* 68*   Basic Metabolic Panel: Recent Labs  Lab 06/17/17 2024 06/18/17 0927 06/19/17 0320 06/20/17 0757 06/21/17 0724 06/22/17 0715 06/23/17 1013  NA  --  130* 134* 137 140 141 144  K  --  3.6 4.0 3.7 3.5 4.2 4.1  CL  --  106 111 114* 117* 118* 121*  CO2  --  12* 13* 13* 15* 17* 17*  GLUCOSE  --  81 59* 85 127* 96 88  BUN  --  67* 63* 55* 51* 52* 37*  CREATININE  --  4.50* 3.35* 2.27* 1.66* 1.48* 1.29*  CALCIUM  --  6.2* 6.1* 6.8* 7.0* 7.3* 7.5*  MG 1.1* 1.5*  --   --   --   --   --   PHOS 6.1* 5.2*  --   --   --   --   --    GFR: Estimated Creatinine Clearance: 53.9 mL/min (A) (by C-G formula based on SCr of 1.29 mg/dL (H)). Liver Function Tests: Recent Labs  Lab 06/17/17 1525 06/18/17 0927 06/21/17 0724  AST 72* 69* 83*  ALT 24 21 24   ALKPHOS 63 45 136*  BILITOT 2.1* 2.2* 1.7*  PROT 6.3* 4.8* 4.8*  ALBUMIN 2.3* 1.8* 1.4*   Recent Labs  Lab 06/17/17 1525  LIPASE 23   Recent Labs  Lab 06/17/17 2124 06/23/17 1023  AMMONIA 114* 19   Coagulation Profile: Recent Labs  Lab 06/17/17 2024  INR 1.38   Cardiac Enzymes: Recent Labs  Lab 06/17/17 2024  CKTOTAL 75   BNP (last 3 results) No results for input(s): PROBNP in the last 8760 hours. HbA1C: No results for input(s): HGBA1C in the last 72 hours. CBG: Recent Labs  Lab 06/20/17 0459 06/20/17 0817 06/20/17 1133 06/20/17 1703 06/20/17 2113  GLUCAP 97 92 80 116* 123*   Lipid Profile: No results for  input(s): CHOL, HDL, LDLCALC, TRIG, CHOLHDL, LDLDIRECT in the last 72 hours. Thyroid Function Tests: No results for input(s): TSH, T4TOTAL, FREET4, T3FREE, THYROIDAB in the last 72 hours. Anemia Panel: No results for input(s): VITAMINB12, FOLATE, FERRITIN, TIBC, IRON, RETICCTPCT in the last 72 hours. Urine analysis:    Component Value Date/Time   COLORURINE AMBER (A) 06/17/2017 1649   APPEARANCEUR HAZY (A) 06/17/2017 1649   LABSPEC 1.013 06/17/2017 1649   PHURINE 5.0 06/17/2017 1649   GLUCOSEU NEGATIVE 06/17/2017 1649   HGBUR SMALL (A) 06/17/2017 1649   BILIRUBINUR NEGATIVE 06/17/2017 1649   KETONESUR NEGATIVE 06/17/2017 1649   PROTEINUR NEGATIVE 06/17/2017 1649   UROBILINOGEN 2.0 (H) 04/26/2013 0945   NITRITE NEGATIVE 06/17/2017 1649   LEUKOCYTESUR TRACE (A) 06/17/2017 1649   Sepsis Labs: Invalid input(s): PROCALCITONIN, LACTICIDVEN  Recent Results (from the past 240 hour(s))  Blood culture (routine x 2)     Status: Abnormal   Collection Time: 06/17/17  5:09 PM  Result Value Ref Range Status   Specimen Description BLOOD RIGHT ANTECUBITAL  Final   Special Requests   Final    BOTTLES DRAWN AEROBIC AND ANAEROBIC Blood Culture adequate volume   Culture  Setup Time   Final    GRAM POSITIVE COCCI IN BOTH AEROBIC AND ANAEROBIC BOTTLES CRITICAL RESULT CALLED TO, READ BACK BY AND VERIFIED WITH: K PATTON PHARMD 06/18/17 0735 JDW    Culture (A)  Final    GROUP A STREP (S.PYOGENES) ISOLATED HEALTH DEPARTMENT NOTIFIED Performed at [facility], [place] [address], [place], [place] [address]    Report Status 06/20/2017 FINAL  Final   Organism ID, Bacteria GROUP A STREP (S.PYOGENES) ISOLATED  Final      Susceptibility   Group a strep (s.pyogenes) isolated - MIC*    PENICILLIN <=0.06 SENSITIVE Sensitive     CEFTRIAXONE <=0.12 SENSITIVE Sensitive     ERYTHROMYCIN <=0.12 SENSITIVE Sensitive     LEVOFLOXACIN 0.5 SENSITIVE Sensitive     VANCOMYCIN <=0.12 SENSITIVE Sensitive     *  GROUP A STREP (S.PYOGENES) ISOLATED  Blood culture (routine x 2)     Status: Abnormal   Collection Time: 06/17/17  5:26 PM  Result Value Ref Range Status   Specimen Description BLOOD LEFT ANTECUBITAL  Final   Special Requests   Final    BOTTLES DRAWN AEROBIC AND ANAEROBIC Blood Culture adequate volume   Culture  Setup Time   Final    GRAM POSITIVE COCCI IN BOTH AEROBIC AND ANAEROBIC BOTTLES CRITICAL RESULT CALLED TO, READ BACK BY AND VERIFIED WITH: K PATTON PHARMD 06/18/17 0735 JDW    Culture (A)  Final    GROUP A STREP (S.PYOGENES) ISOLATED SUSCEPTIBILITIES PERFORMED ON PREVIOUS CULTURE WITHIN THE LAST 5 DAYS. HEALTH DEPARTMENT NOTIFIED Performed at [facility], [place] [address] [place], [place] [address]    Report Status 06/20/2017 FINAL  Final  Blood Culture ID Panel (Reflexed)     Status: Abnormal   Collection Time: 06/17/17  5:26 PM  Result Value Ref Range Status   Enterococcus species NOT DETECTED NOT DETECTED Final   Listeria monocytogenes NOT DETECTED NOT DETECTED Final   Staphylococcus species NOT DETECTED NOT DETECTED Final   Staphylococcus aureus NOT DETECTED NOT DETECTED Final   Streptococcus species DETECTED (A) NOT DETECTED Final    Comment: CRITICAL RESULT CALLED TO, READ BACK BY AND VERIFIED WITH: K PATTON PHARMD 06/18/17 0735 JDW    Streptococcus agalactiae NOT DETECTED NOT DETECTED Final   Streptococcus pneumoniae NOT DETECTED NOT DETECTED Final   Streptococcus pyogenes DETECTED (A) NOT DETECTED Final    Comment: CRITICAL RESULT CALLED TO, READ BACK BY AND VERIFIED WITH: K PATTON PHARMD 06/18/17 0735 JDW    Acinetobacter baumannii NOT DETECTED NOT DETECTED Final   Enterobacteriaceae species NOT DETECTED NOT DETECTED Final   Enterobacter cloacae complex NOT DETECTED NOT DETECTED Final   Escherichia coli NOT DETECTED NOT DETECTED Final   Klebsiella oxytoca NOT DETECTED NOT DETECTED Final   Klebsiella pneumoniae NOT DETECTED NOT DETECTED Final   Proteus  species NOT DETECTED NOT DETECTED Final   Serratia marcescens NOT DETECTED NOT DETECTED Final   Haemophilus influenzae NOT DETECTED NOT DETECTED Final   Neisseria meningitidis NOT DETECTED NOT DETECTED Final   Pseudomonas aeruginosa NOT DETECTED NOT DETECTED Final   Candida  albicans NOT DETECTED NOT DETECTED Final   Candida glabrata NOT DETECTED NOT DETECTED Final   Candida krusei NOT DETECTED NOT DETECTED Final   Candida parapsilosis NOT DETECTED NOT DETECTED Final   Candida tropicalis NOT DETECTED NOT DETECTED Final    Comment: Performed at [facility], [place] [address] [place], [place] [address]  Urine Culture     Status: None   Collection Time: 06/17/17 10:04 PM  Result Value Ref Range Status   Specimen Description URINE, CLEAN CATCH  Final   Special Requests NONE  Final   Culture   Final    NO GROWTH Performed at [facility], [place] [address], [place], [place] [address]    Report Status 06/19/2017 FINAL  Final  Gastrointestinal Panel by PCR , Stool     Status: None   Collection Time: 06/18/17  6:41 PM  Result Value Ref Range Status   Campylobacter species NOT DETECTED NOT DETECTED Final   Plesimonas shigelloides NOT DETECTED NOT DETECTED Final   Salmonella species NOT DETECTED NOT DETECTED Final   Yersinia enterocolitica NOT DETECTED NOT DETECTED Final   Vibrio species NOT DETECTED NOT DETECTED Final   Vibrio cholerae NOT DETECTED NOT DETECTED Final   Enteroaggregative E coli (EAEC) NOT DETECTED NOT DETECTED Final   Enteropathogenic E coli (EPEC) NOT DETECTED NOT DETECTED Final   Enterotoxigenic E coli (ETEC) NOT DETECTED NOT DETECTED Final   Shiga like toxin producing E coli (STEC) NOT DETECTED NOT DETECTED Final   Shigella/Enteroinvasive E coli (EIEC) NOT DETECTED NOT DETECTED Final   Cryptosporidium NOT DETECTED NOT DETECTED Final   Cyclospora cayetanensis NOT DETECTED NOT DETECTED Final   Entamoeba histolytica NOT DETECTED NOT DETECTED Final   Giardia lamblia  NOT DETECTED NOT DETECTED Final   Adenovirus F40/41 NOT DETECTED NOT DETECTED Final   Astrovirus NOT DETECTED NOT DETECTED Final   Norovirus GI/GII NOT DETECTED NOT DETECTED Final   Rotavirus A NOT DETECTED NOT DETECTED Final   Sapovirus (I, II, IV, and V) NOT DETECTED NOT DETECTED Final    Comment: Performed at [facility], [place] [place], [place] [address]  MRSA PCR Screening     Status: Abnormal   Collection Time: 06/18/17  8:10 PM  Result Value Ref Range Status   MRSA by PCR POSITIVE (A) NEGATIVE Final    Comment:        The GeneXpert MRSA Assay (FDA approved for NASAL specimens only), is one component of a comprehensive MRSA colonization surveillance program. It is not intended to diagnose MRSA infection nor to guide or monitor treatment for MRSA infections. RESULT CALLED TO, READ BACK BY AND VERIFIED WITH: Z.HASSAN,RN AT 0043 06/19/17 L.PITT   Culture, blood (routine x 2)     Status: None (Preliminary result)   Collection Time: 06/21/17 11:45 AM  Result Value Ref Range Status   Specimen Description BLOOD LEFT HAND  Final   Special Requests IN PEDIATRIC BOTTLE Blood Culture adequate volume  Final   Culture   Final    NO GROWTH 2 DAYS Performed at [facility], [address], [place], [place] [address]    Report Status PENDING  Incomplete      Radiology Studies: No results found.   Scheduled Meds: . Chlorhexidine Gluconate Cloth  6 each Topical Q0600  . folic acid  1 mg Oral Daily  . lactulose  30 g Oral TID  . mupirocin ointment  1 application Nasal BID  . nadolol  10 mg Oral Daily  . pantoprazole (PROTONIX) IV  40 mg Intravenous Q12H  . thiamine  100 mg Oral Daily   Continuous Infusions: . pencillin G potassium IV 3 Million Units (06/23/17 1505)    Domenic Polite, MD [facility] Page via [email or web address] password [email or web address]  If 7PM-7AM, please contact night-coverage  06/23/2017, 3:13 PM

## 2017-06-23 NOTE — Evaluation (Signed)
Occupational Therapy Evaluation Patient Details Name: Mark [name] MRN: [id number] DOB: Dec 16, 1955 Today's Date: 06/23/2017    History of Present Illness 62yo male presenting with generalized fatigue, nausea, vomiting, R LE swelling, flu like symptoms. Admitted with severe dehydration and sepsis, acute on chronic kidney failure. PMH alcohol abuse, cirrhosis, heart murmur, HTN, LE swelling, hx MI, hx CVA, hx hernia repair, B knee arthroscopy, hx TKR    Clinical Impression   Pt admitted with the above diagnosis and has the deficits listed below. Pt was very agitated and not able to follow all commands during session.  Pt did better when sitting up on the edge of the bed but continued to need constant cues for safety and repetition following commands. Spoke to nurse who stated he was at test earlier that he also had to be sedated for.  Behavior appears to be medicine related per nursing as he was participating well in PT yesterday. Will continue to follow with goals of increasing independence with LE adls.     Follow Up Recommendations  Home health OT;Supervision/Assistance - 24 hour    Equipment Recommendations  None recommended by OT    Recommendations for Other Services       Precautions / Restrictions Precautions Precautions: Fall Restrictions Weight Bearing Restrictions: No      Mobility Bed Mobility Overal bed mobility: Needs Assistance Bed Mobility: Supine to Sit;Sit to Supine     Supine to sit: Supervision Sit to supine: Supervision   General bed mobility comments: increased time and effort   Transfers Overall transfer level: Needs assistance Equipment used: Rolling walker (2 wheeled) Transfers: Sit to/from Stand Sit to Stand: Min assist         General transfer comment: Pt very unsafe today pulling on walker and not following commands or recs for safety.    Balance Overall balance assessment: Needs assistance Sitting-balance support: Bilateral upper  extremity supported;Feet supported Sitting balance-[name]: Fair     Standing balance support: Bilateral upper extremity supported;During functional activity Standing balance-[name]: Fair                             ADL either performed or assessed with clinical judgement   ADL Overall ADL's : Needs assistance/impaired Eating/Feeding: Minimal assistance;Sitting   Grooming: Minimal assistance;Sitting   Upper Body Bathing: Minimal assistance;Sitting   Lower Body Bathing: Moderate assistance;Sit to/from stand   Upper Body Dressing : Set up;Sitting   Lower Body Dressing: Maximal assistance;Sit to/from stand   Toilet Transfer: Minimal Patent examiner Details (indicate cue type and reason): Pt very agitated but moved well just unsafe. Toileting- Clothing Manipulation and Hygiene: Minimal assistance;Sit to/from stand       Functional mobility during ADLs: Minimal assistance General ADL Comments: Feel pt's cognitive issues today may have been medicine related.  Will continue to evaluate in future sessions.      Vision   Vision Assessment?: Vision impaired- to be further tested in functional context     Perception Perception Perception Tested?: No   Praxis      Pertinent Vitals/Pain Pain Assessment: No/denies pain Pain Intervention(s): Limited activity within patient's tolerance;Repositioned     Hand Dominance     Extremity/Trunk Assessment Upper Extremity Assessment Upper Extremity Assessment: Overall WFL for tasks assessed   Lower Extremity Assessment Lower Extremity Assessment: Defer to PT evaluation   Cervical / Trunk Assessment Cervical / Trunk Assessment: Kyphotic   Communication Communication Communication: Other (  comment)(Pt talking nonsensical )   Cognition Arousal/Alertness: Suspect due to medications Behavior During Therapy: Agitated Overall Cognitive Status: Impaired/Different from baseline Area of Impairment:  Orientation;Safety/judgement;Following commands;Memory;Attention;Awareness;Problem solving                 Orientation Level: Place;Time;Situation Current Attention Level: Sustained Memory: Decreased recall of precautions;Decreased short-term memory Following Commands: Follows one step commands inconsistently Safety/Judgement: Decreased awareness of safety;Decreased awareness of deficits Awareness: Intellectual Problem Solving: Slow processing General Comments: Pt given ativan last night for combativeness.  Pt was doing better but continues to be very confused and unable to follow through with therapy.   General Comments  Pt very agitated today but did attempt simple adls. pt requring a great amount of assist due to confusion that was no present yesterday according to PT notes.    Exercises     Shoulder Instructions      Home Living Family/patient expects to be discharged to:: Private residence Living Arrangements: Spouse/significant other Available Help at Discharge: Family Type of Home: House Home Access: Stairs to enter Technical brewer of Steps: 2 Entrance Stairs-Rails: None Home Layout: One level     Bathroom Shower/Tub: Tub only   Biochemist, clinical: Standard     Home Equipment: Environmental consultant - 2 wheels;Cane - single point          Prior Functioning/Environment Level of Independence: Independent                 OT Problem List: Decreased activity tolerance;Impaired balance (sitting and/or standing);Decreased cognition;Decreased safety awareness;Decreased knowledge of use of DME or AE;Decreased knowledge of precautions;Increased edema      OT Treatment/Interventions: Self-care/ADL training;DME and/or AE instruction;Therapeutic activities    OT Goals(Current goals can be found in the care plan section) Acute Rehab OT Goals Patient Stated Goal: none stated OT Goal Formulation: Patient unable to participate in goal setting Time For Goal Achievement:  07/07/17 Potential to Achieve Goals: Fair ADL Goals Pt Will Perform Lower Body Bathing: with supervision;sit to/from stand;with adaptive equipment Pt Will Perform Lower Body Dressing: with supervision;with adaptive equipment;sit to/from stand Additional ADL Goal #1: Pt will walk to toilet and complete all toileting with 3:1 over commode with S. Additional ADL Goal #2: Pt will follow all basic commands without cues to increase ability to participate in adls.  OT Frequency: Min 2X/week   Barriers to D/C:            Co-evaluation              AM-PAC PT "6 Clicks" Daily Activity     Outcome Measure Help from another person eating meals?: A Little Help from another person taking care of personal grooming?: A Little Help from another person toileting, which includes using toliet, bedpan, or urinal?: A Lot Help from another person bathing (including washing, rinsing, drying)?: A Little Help from another person to put on and taking off regular upper body clothing?: A Little Help from another person to put on and taking off regular lower body clothing?: A Little 6 Click Score: 17   End of Session Nurse Communication: Mobility status  Activity Tolerance: Treatment limited secondary to agitation Patient left: in bed;with call bell/phone within reach;with bed alarm set  OT Visit Diagnosis: Unsteadiness on feet (R26.81);Other abnormalities of gait and mobility (R26.89);Other symptoms and signs involving cognitive function                Time: 9924-2683 OT Time Calculation (min): 14 min Charges:  OT General Charges $  OT Visit: 1 Visit OT Evaluation $OT Eval Moderate Complexity: 1 Mod G-Codes:     Gayanne Prescott,OTR/L [phone]  Glenford Peers 06/23/2017, 11:32 AM

## 2017-06-24 DIAGNOSIS — A400 Sepsis due to streptococcus, group A: Secondary | ICD-10-CM

## 2017-06-24 LAB — H. PYLORI ANTIBODY, IGG: H Pylori IgG: 1.96 Index Value — ABNORMAL HIGH (ref 0.00–0.79)

## 2017-06-24 MED ORDER — HALOPERIDOL LACTATE 5 MG/ML IJ SOLN
INTRAMUSCULAR | Status: AC
  Administered 2017-06-24: 05:00:00
  Filled 2017-06-24: qty 1

## 2017-06-24 MED ORDER — RIFAXIMIN 550 MG PO TABS
550.0000 mg | ORAL_TABLET | Freq: Two times a day (BID) | ORAL | Status: DC
  Administered 2017-06-24 – 2017-06-27 (×7): 550 mg via ORAL
  Filled 2017-06-24 (×8): qty 1

## 2017-06-24 MED ORDER — HALOPERIDOL LACTATE 5 MG/ML IJ SOLN: 2.0000 mg | Freq: Once | INTRAMUSCULAR | Status: DC

## 2017-06-24 MED ORDER — NADOLOL 20 MG PO TABS
20.0000 mg | ORAL_TABLET | Freq: Every day | ORAL | Status: DC
  Administered 2017-06-24 – 2017-06-27 (×4): 20 mg via ORAL
  Filled 2017-06-24 (×4): qty 1

## 2017-06-24 MED ORDER — PANTOPRAZOLE SODIUM 40 MG PO TBEC
40.0000 mg | DELAYED_RELEASE_TABLET | Freq: Two times a day (BID) | ORAL | Status: DC
  Administered 2017-06-24 – 2017-06-27 (×7): 40 mg via ORAL
  Filled 2017-06-24 (×7): qty 1

## 2017-06-24 MED ORDER — QUETIAPINE FUMARATE 25 MG PO TABS
25.0000 mg | ORAL_TABLET | Freq: Every day | ORAL | Status: DC
  Administered 2017-06-24 – 2017-06-26 (×3): 25 mg via ORAL
  Filled 2017-06-24 (×3): qty 1

## 2017-06-24 NOTE — Progress Notes (Signed)
PROGRESS NOTE    Mark [name]  TDD:[id number] DOB: 1956/03/12 DOA: 06/17/2017 PCP: [name], MD     Brief Narrative:  Mark [name] is a 62 year old male with history of advanced alcoholic cirrhosis, ongoing alcohol abuse, history of esophageal varices, chronic anemia, hypertension, coronary artery disease with prior MI, thrombocytopenia, severe hypoalbuminemia who presented with generalized weakness, nausea vomiting and right leg swelling. He was found to have severe sepsis, cellulitis of right leg, group A strep bacteremia, hypotension, acute kidney injury. He was briefly in the ICU 48 hours, did not have pressors and was transferred to hospitalist service on 2/23. He then developed active upper GI bleed and required IV PPI, 2u pRBC, and underwent EGD 2/25 which showed ulcerative duodenitis. Hospitalization is further complicated by delirium.   Assessment & Plan:   Active Problems:   Cirrhosis, alcoholic ([name])   Anemia, iron deficiency   Dehydration   Hypotension   Acute on chronic renal failure ([name])   Hyponatremia   Thrombocytopenia ([name])   Sepsis ([name])   Leg edema, right   Acute metabolic encephalopathy   Septic shock ([name])   Cirrhosis ([name])   Coffee ground emesis   Acute on chronic anemia   Duodenal ulcer   Renal failure   Severe sepsis secondary to group A strep bacteremia, right lower extremity cellulitis -Continue IV penicillin G, completed 48 hours of clindamycin then discontinued. Dr. Broadus John discussed with Dr. Baxter Flattery of infectious disease. Plan for total 14 day course. Can deescalate to PO at discharge.  -Repeat blood cultures negative  Upper GI bleed / acute blood loss anemia -Transfused 2u pRBC  -EGD 2/25 with ulcerative duodenitis, continue PPI BID at least 8 weeks and then daily thereafter, monitor Hgb, stop octreotide -GI following  Acute encephalopathy -Out of window for alcohol withdrawal -Ammonia 19  -Likely medication induced,  got worse after ativan per wife. Stop ativan. Will add seroquel qhs   Alcoholic cirrhosis of the liver -Patient being followed as an outpatient by Dr. Ardis Hughs, he unfortunately continues to drink about 1 - 40 ounce beer per day -Continue lactulose, nadolol, started rifaximin  -Hold spironolactone, furosemide  Acute kidney injury on chronic kidney disease stage II-III -Baseline creatinine around 1.1-1.2, creatinine 6.5 on admission -Cr back to near baseline   Thrombocytopenia -Likely in the setting of liver disease, no bleeding, monitor -Stable    DVT prophylaxis: SCD Code Status: Full Family Communication: Wife at bedside Disposition Plan: Pending improvement    Consultants:   PCCM  Nephrology  GI  Procedures:   EGD 2/25   Antimicrobials:  Anti-infectives (From admission, onward)   Start     Dose/Rate Route Frequency Ordered Stop   06/24/17 1000  rifaximin (XIFAXAN) tablet 550 mg     550 mg Oral 2 times daily 06/24/17 0910     06/23/17 2000  penicillin G potassium 3 Million Units in dextrose 5 % 100 mL IVPB     3 Million Units 200 mL/hr over 30 Minutes Intravenous Every 4 hours 06/23/17 1623     06/20/17 0400  penicillin G potassium 3 Million Units in dextrose 44mL IVPB  Status:  Discontinued     3 Million Units 100 mL/hr over 30 Minutes Intravenous Every 4 hours 06/19/17 1939 06/23/17 1623   06/19/17 1700  vancomycin (VANCOCIN) IVPB 1000 mg/200 mL premix  Status:  Discontinued     1,000 mg 200 mL/hr over 60 Minutes Intravenous Every 48 hours 06/18/17 0730 06/18/17 0905   06/19/17  1600  penicillin G potassium 3 Million Units in dextrose 3mL IVPB     3 Million Units 100 mL/hr over 30 Minutes Intravenous Every 4 hours 06/19/17 1332 06/19/17 2209   06/18/17 1400  penicillin G potassium 3 Million Units in dextrose 75mL IVPB  Status:  Discontinued     3 Million Units 100 mL/hr over 30 Minutes Intravenous Every 8 hours 06/18/17 0905 06/19/17 1332   06/18/17 0915   clindamycin (CLEOCIN) IVPB 900 mg  Status:  Discontinued     900 mg 100 mL/hr over 30 Minutes Intravenous Every 8 hours 06/18/17 0905 06/20/17 1020   06/18/17 0800  piperacillin-tazobactam (ZOSYN) IVPB 2.25 g  Status:  Discontinued     2.25 g 100 mL/hr over 30 Minutes Intravenous Every 8 hours 06/18/17 0730 06/18/17 0905   06/17/17 1630  vancomycin (VANCOCIN) 1,250 mg in sodium chloride 0.9 % 250 mL IVPB     1,250 mg 166.7 mL/hr over 90 Minutes Intravenous  Once 06/17/17 1557 06/17/17 1854   06/17/17 1600  piperacillin-tazobactam (ZOSYN) IVPB 3.375 g     3.375 g 100 mL/hr over 30 Minutes Intravenous  Once 06/17/17 1557 06/17/17 1753       Subjective: Confused and unable to provide ROS. Wife states that patient was up all night, restless and delirious. Finally was able to calm down and eat grits, and now sleeping   Objective: Vitals:   06/23/17 0427 06/23/17 0745 06/23/17 2138 06/24/17 0528  BP: (!) 148/63 (!) 144/73 136/82 (!) 107/92  Pulse: 70 66 79 83  Resp: 18 18 19 20   Temp: 97.8 F (36.6 C) 97.6 F (36.4 C) 98.9 F (37.2 C) 98.6 F (37 C)  TempSrc: Axillary Oral Oral Oral  SpO2: 100% 100% 100% 100%  Weight:   72.5 kg (159 lb 13.3 oz)   Height:        Intake/Output Summary (Last 24 hours) at 06/24/2017 1400 Last data filed at 06/24/2017 0610 Gross per 24 hour  Intake 200 ml  Output 725 ml  Net -525 ml   Filed Weights   06/22/17 1035 06/22/17 2130 06/23/17 2138  Weight: 70.8 kg (156 lb) 72.7 kg (160 lb 4.4 oz) 72.5 kg (159 lb 13.3 oz)    Examination:  General exam: Appears calm and comfortable, in bed with restraints in place Respiratory system: Clear to auscultation. Respiratory effort normal. Cardiovascular system: S1 & S2 heard, RRR. No JVD, murmurs, rubs, gallops or clicks. No pedal edema. Gastrointestinal system: Abdomen is nondistended, soft and nontender. No organomegaly or masses felt. Normal bowel sounds heard. Central nervous system: Alert to voice but  easily falls asleep  Extremities: Symmetric 5 x 5 power. Skin: +Dry and clean dressing RLE   Data Reviewed: I have personally reviewed following labs and imaging studies  CBC: Recent Labs  Lab 06/20/17 0757 06/21/17 0724 06/21/17 0945 06/22/17 0715 06/23/17 1013  WBC 19.5* 17.5* 15.8* 14.7* 10.0  NEUTROABS  --   --   --  11.5*  --   HGB 8.5* 6.6* 6.7* 8.8* 8.3*  HCT 23.3* 18.2* 20.1* 24.4* 23.9*  MCV 91.0 89.7 102.6* 86.8 89.5  PLT 44* 50* 69* 47* 68*   Basic Metabolic Panel: Recent Labs  Lab 06/17/17 2024 06/18/17 0927 06/19/17 0320 06/20/17 0757 06/21/17 0724 06/22/17 0715 06/23/17 1013  NA  --  130* 134* 137 140 141 144  K  --  3.6 4.0 3.7 3.5 4.2 4.1  CL  --  106 111 114* 117* 118* 121*  CO2  --  12* 13* 13* 15* 17* 17*  GLUCOSE  --  81 59* 85 127* 96 88  BUN  --  67* 63* 55* 51* 52* 37*  CREATININE  --  4.50* 3.35* 2.27* 1.66* 1.48* 1.29*  CALCIUM  --  6.2* 6.1* 6.8* 7.0* 7.3* 7.5*  MG 1.1* 1.5*  --   --   --   --   --   PHOS 6.1* 5.2*  --   --   --   --   --    GFR: Estimated Creatinine Clearance: 53.9 mL/min (A) (by C-G formula based on SCr of 1.29 mg/dL (H)). Liver Function Tests: Recent Labs  Lab 06/17/17 1525 06/18/17 0927 06/21/17 0724  AST 72* 69* 83*  ALT 24 21 24   ALKPHOS 63 45 136*  BILITOT 2.1* 2.2* 1.7*  PROT 6.3* 4.8* 4.8*  ALBUMIN 2.3* 1.8* 1.4*   Recent Labs  Lab 06/17/17 1525  LIPASE 23   Recent Labs  Lab 06/17/17 2124 06/23/17 1023  AMMONIA 114* 19   Coagulation Profile: Recent Labs  Lab 06/17/17 2024  INR 1.38   Cardiac Enzymes: Recent Labs  Lab 06/17/17 2024  CKTOTAL 75   BNP (last 3 results) No results for input(s): PROBNP in the last 8760 hours. HbA1C: No results for input(s): HGBA1C in the last 72 hours. CBG: Recent Labs  Lab 06/20/17 0459 06/20/17 0817 06/20/17 1133 06/20/17 1703 06/20/17 2113  GLUCAP 97 92 80 116* 123*   Lipid Profile: No results for input(s): CHOL, HDL, LDLCALC, TRIG, CHOLHDL,  LDLDIRECT in the last 72 hours. Thyroid Function Tests: No results for input(s): TSH, T4TOTAL, FREET4, T3FREE, THYROIDAB in the last 72 hours. Anemia Panel: No results for input(s): VITAMINB12, FOLATE, FERRITIN, TIBC, IRON, RETICCTPCT in the last 72 hours. Sepsis Labs: Recent Labs  Lab 06/17/17 1717 06/18/17 0927 06/18/17 1126 06/19/17 0320  PROCALCITON  --  20.28  --   --   LATICACIDVEN 1.88 2.7* 2.6* 1.9    Recent Results (from the past 240 hour(s))  Blood culture (routine x 2)     Status: Abnormal   Collection Time: 06/17/17  5:09 PM  Result Value Ref Range Status   Specimen Description BLOOD RIGHT ANTECUBITAL  Final   Special Requests   Final    BOTTLES DRAWN AEROBIC AND ANAEROBIC Blood Culture adequate volume   Culture  Setup Time   Final    GRAM POSITIVE COCCI IN BOTH AEROBIC AND ANAEROBIC BOTTLES CRITICAL RESULT CALLED TO, READ BACK BY AND VERIFIED WITH: K [place] PHARMD 06/18/17 0735 JDW    Culture (A)  Final    GROUP A STREP (S.PYOGENES) ISOLATED HEALTH DEPARTMENT NOTIFIED Performed at [facility], [place] [address], [place], [place] [address]    Report Status 06/20/2017 FINAL  Final   Organism ID, Bacteria GROUP A STREP (S.PYOGENES) ISOLATED  Final      Susceptibility   Group a strep (s.pyogenes) isolated - MIC*    PENICILLIN <=0.06 SENSITIVE Sensitive     CEFTRIAXONE <=0.12 SENSITIVE Sensitive     ERYTHROMYCIN <=0.12 SENSITIVE Sensitive     LEVOFLOXACIN 0.5 SENSITIVE Sensitive     VANCOMYCIN <=0.12 SENSITIVE Sensitive     * GROUP A STREP (S.PYOGENES) ISOLATED  Blood culture (routine x 2)     Status: Abnormal   Collection Time: 06/17/17  5:26 PM  Result Value Ref Range Status   Specimen Description BLOOD LEFT ANTECUBITAL  Final   Special Requests   Final  BOTTLES DRAWN AEROBIC AND ANAEROBIC Blood Culture adequate volume   Culture  Setup Time   Final    GRAM POSITIVE COCCI IN BOTH AEROBIC AND ANAEROBIC BOTTLES CRITICAL RESULT CALLED TO, READ BACK BY  AND VERIFIED WITH: K [place] PHARMD 06/18/17 0735 JDW    Culture (A)  Final    GROUP A STREP (S.PYOGENES) ISOLATED SUSCEPTIBILITIES PERFORMED ON PREVIOUS CULTURE WITHIN THE LAST 5 DAYS. HEALTH DEPARTMENT NOTIFIED Performed at [facility], [place] [address], [place], [place] [address]    Report Status 06/20/2017 FINAL  Final  Blood Culture ID Panel (Reflexed)     Status: Abnormal   Collection Time: 06/17/17  5:26 PM  Result Value Ref Range Status   Enterococcus species NOT DETECTED NOT DETECTED Final   Listeria monocytogenes NOT DETECTED NOT DETECTED Final   Staphylococcus species NOT DETECTED NOT DETECTED Final   Staphylococcus aureus NOT DETECTED NOT DETECTED Final   Streptococcus species DETECTED (A) NOT DETECTED Final    Comment: CRITICAL RESULT CALLED TO, READ BACK BY AND VERIFIED WITH: K PATTON PHARMD 06/18/17 0735 JDW    Streptococcus agalactiae NOT DETECTED NOT DETECTED Final   Streptococcus pneumoniae NOT DETECTED NOT DETECTED Final   Streptococcus pyogenes DETECTED (A) NOT DETECTED Final    Comment: CRITICAL RESULT CALLED TO, READ BACK BY AND VERIFIED WITH: K PATTON PHARMD 06/18/17 0735 JDW    Acinetobacter baumannii NOT DETECTED NOT DETECTED Final   Enterobacteriaceae species NOT DETECTED NOT DETECTED Final   Enterobacter cloacae complex NOT DETECTED NOT DETECTED Final   Escherichia coli NOT DETECTED NOT DETECTED Final   Klebsiella oxytoca NOT DETECTED NOT DETECTED Final   Klebsiella pneumoniae NOT DETECTED NOT DETECTED Final   Proteus species NOT DETECTED NOT DETECTED Final   Serratia marcescens NOT DETECTED NOT DETECTED Final   Haemophilus influenzae NOT DETECTED NOT DETECTED Final   Neisseria meningitidis NOT DETECTED NOT DETECTED Final   Pseudomonas aeruginosa NOT DETECTED NOT DETECTED Final   Candida albicans NOT DETECTED NOT DETECTED Final   Candida glabrata NOT DETECTED NOT DETECTED Final   Candida krusei NOT DETECTED NOT DETECTED Final   Candida parapsilosis  NOT DETECTED NOT DETECTED Final   Candida tropicalis NOT DETECTED NOT DETECTED Final    Comment: Performed at [facility], [place] [address], [place], [place] [address]  Urine Culture     Status: None   Collection Time: 06/17/17 10:04 PM  Result Value Ref Range Status   Specimen Description URINE, CLEAN CATCH  Final   Special Requests NONE  Final   Culture   Final    NO GROWTH Performed at [facility], [place] [address] [place], [place] [address]    Report Status 06/19/2017 FINAL  Final  Gastrointestinal Panel by PCR , Stool     Status: None   Collection Time: 06/18/17  6:41 PM  Result Value Ref Range Status   Campylobacter species NOT DETECTED NOT DETECTED Final   Plesimonas shigelloides NOT DETECTED NOT DETECTED Final   Salmonella species NOT DETECTED NOT DETECTED Final   Yersinia enterocolitica NOT DETECTED NOT DETECTED Final   Vibrio species NOT DETECTED NOT DETECTED Final   Vibrio cholerae NOT DETECTED NOT DETECTED Final   Enteroaggregative E coli (EAEC) NOT DETECTED NOT DETECTED Final   Enteropathogenic E coli (EPEC) NOT DETECTED NOT DETECTED Final   Enterotoxigenic E coli (ETEC) NOT DETECTED NOT DETECTED Final   Shiga like toxin producing E coli (STEC) NOT DETECTED NOT DETECTED Final  Shigella/Enteroinvasive E coli (EIEC) NOT DETECTED NOT DETECTED Final   Cryptosporidium NOT DETECTED NOT DETECTED Final   Cyclospora cayetanensis NOT DETECTED NOT DETECTED Final   Entamoeba histolytica NOT DETECTED NOT DETECTED Final   Giardia lamblia NOT DETECTED NOT DETECTED Final   Adenovirus F40/41 NOT DETECTED NOT DETECTED Final   Astrovirus NOT DETECTED NOT DETECTED Final   Norovirus GI/GII NOT DETECTED NOT DETECTED Final   Rotavirus A NOT DETECTED NOT DETECTED Final   Sapovirus (I, II, IV, and V) NOT DETECTED NOT DETECTED Final    Comment: Performed at [facility], [place] [place], [place] [address]  MRSA PCR Screening     Status: Abnormal   Collection  Time: 06/18/17  8:10 PM  Result Value Ref Range Status   MRSA by PCR POSITIVE (A) NEGATIVE Final    Comment:        The GeneXpert MRSA Assay (FDA approved for NASAL specimens only), is one component of a comprehensive MRSA colonization surveillance program. It is not intended to diagnose MRSA infection nor to guide or monitor treatment for MRSA infections. RESULT CALLED TO, READ BACK BY AND VERIFIED WITH: Z.HASSAN,RN AT 0043 06/19/17 L.PITT   Culture, blood (routine x 2)     Status: None (Preliminary result)   Collection Time: 06/21/17 11:45 AM  Result Value Ref Range Status   Specimen Description BLOOD LEFT HAND  Final   Special Requests IN PEDIATRIC BOTTLE Blood Culture adequate volume  Final   Culture   Final    NO GROWTH 3 DAYS Performed at [facility], [place] [address] [place], [place] [address]    Report Status PENDING  Incomplete       Radiology Studies: No results found.    Scheduled Meds: . folic acid  1 mg Oral Daily  . lactulose  30 g Oral TID  . nadolol  20 mg Oral Daily  . pantoprazole  40 mg Oral BID  . QUEtiapine  25 mg Oral QHS  . rifaximin  550 mg Oral BID  . thiamine  100 mg Oral Daily   Continuous Infusions: . pencillin G potassium IV Stopped (06/24/17 1245)     LOS: 7 days    Time spent: 40 minutes   Dessa Phi, DO [facility] [email or web address] Password [email or web address] 06/24/2017, 2:00 PM

## 2017-06-24 NOTE — Progress Notes (Signed)
Daily Rounding Note  06/24/2017, 8:54 AM  LOS: 7 days   SUBJECTIVE:   Chief complaint:  Another bought of agitation overnight, per his wife he required security to address behavior and haldol added.  Now sleeping.  Wife got him to eat some grits this AM. Wife happy about how much RLE has gone down.   Stool at 10 PM last night.       OBJECTIVE:         Vital signs in last 24 hours:    Temp:  [98.6 F (37 C)-98.9 F (37.2 C)] 98.6 F (37 C) (02/27 0528) Pulse Rate:  [79-83] 83 (02/27 0528) Resp:  [19-20] 20 (02/27 0528) BP: (107-136)/(82-92) 107/92 (02/27 0528) SpO2:  [100 %] 100 % (02/27 0528) Weight:  [159 lb 13.3 oz (72.5 kg)] 159 lb 13.3 oz (72.5 kg) (02/26 2138) Last BM Date: 06/23/17 Filed Weights   06/22/17 1035 06/22/17 2130 06/23/17 2138  Weight: 156 lb (70.8 kg) 160 lb 4.4 oz (72.7 kg) 159 lb 13.3 oz (72.5 kg)   General: sleeping    Heart: RRR Chest: clear bil in front.  No dyspnea while sleeping Abdomen: soft, NT.  Active BS.    Extremities: swelling in RLE improved Neuro/Psych:  Did not awaken during exam.    Intake/Output from previous day: 02/26 0701 - 02/27 0700 In: 200 [IV Piggyback:200] Out: 725 [Urine:725]  Intake/Output this shift: No intake/output data recorded.  Lab Results: Recent Labs    06/21/17 0945 06/22/17 0715 06/23/17 1013  WBC 15.8* 14.7* 10.0  HGB 6.7* 8.8* 8.3*  HCT 20.1* 24.4* 23.9*  PLT 69* 47* 68*   BMET Recent Labs    06/22/17 0715 06/23/17 1013  NA 141 144  K 4.2 4.1  CL 118* 121*  CO2 17* 17*  GLUCOSE 96 88  BUN 52* 37*  CREATININE 1.48* 1.29*  CALCIUM 7.3* 7.5*   Scheduled Meds: . Chlorhexidine Gluconate Cloth  6 each Topical Q0600  . folic acid  1 mg Oral Daily  . haloperidol lactate  2 mg Intravenous Once  . lactulose  30 g Oral TID  . nadolol  10 mg Oral Daily  . pantoprazole (PROTONIX) IV  40 mg Intravenous Q12H  . thiamine  100 mg Oral Daily     Continuous Infusions: . pencillin G potassium IV 3 Million Units (06/24/17 0841)   PRN Meds:.acetaminophen **OR** acetaminophen, ondansetron **OR** ondansetron (ZOFRAN) IV   ASSESMENT:   *  Black emesis. ABL on top of chronic anemia.    2/25 EGD: mild esophagitis, small HH, portal hypertensive gastropathy, ulcerative duodenitis (this is source of bleeding and acute anemia).  No varices as seen in 01/2016.   On IV BID Protonix   *  ABL anemia on top of chronic anemia.  S/p 2 U PRBC on 2/24 with good response.    *  Cirrhosis of liver,  Secondary to ETOH.  Not sober/abstinent.  Also uses illicit substances.   Viral serologies negative.    *  AMS, multifactorial.  HE with asterixis, on TID Lactulose. Ammonia level 114 >> 19 Agitation >> somnolence cycles in setting of sedation, Ativan (discontinued), Haldol.   *  Thrombocytopenia.  Acute on chronic.     *  Right LE cellulitis with group A strep bacteremia.  Clinda >> Pcn G.     PLAN   *   Increased Nadolol to his home dose of 20 mg daily.  Switch  to oral Protonix BID.  On 5/1 drop dose to 1x daily.  Stay with TID Lactulose.  Added Rifaximin.      Azucena Freed  06/24/2017, 8:54 AM Pager: [phone]

## 2017-06-24 NOTE — Progress Notes (Signed)
Physical Therapy Treatment Patient Details Name: Mark [name] MRN: [id number] DOB: Jun 16, 1955 Today's Date: 06/24/2017    History of Present Illness 62yo male presenting with generalized fatigue, nausea, vomiting, R LE swelling, flu like symptoms. Admitted with severe dehydration and sepsis, acute on chronic kidney failure. PMH alcohol abuse, cirrhosis, heart murmur, HTN, LE swelling, hx MI, hx CVA, hx hernia repair, B knee arthroscopy, hx TKR     PT Comments    Pt still restless, but not fully agitated.  Pt needed guiding cues to progress though task.  VC not sufficient to get pt to participate.   Follow Up Recommendations  Home health PT     Equipment Recommendations  Rolling walker with 5" wheels    Recommendations for Other Services       Precautions / Restrictions Precautions Precautions: Fall    Mobility  Bed Mobility   Bed Mobility: Supine to Sit;Sit to Supine     Supine to sit: Supervision Sit to supine: Supervision   General bed mobility comments: increased time and effort   Transfers     Transfers: Sit to/from Stand Sit to Stand: Min guard         General transfer comment: unsteady, but stood without assist  Ambulation/Gait Ambulation/Gait assistance: Min assist Ambulation Distance (Feet): 250 Feet Assistive device: 1 person hand held assist Gait Pattern/deviations: Step-through pattern   Gait velocity interpretation: Below normal speed for age/gender General Gait Details: gait unsteady and weak-kneed overall, but improved with distance/time up   Stairs            Wheelchair Mobility    Modified Rankin (Stroke Patients Only)       Balance Overall balance assessment: Needs assistance Sitting-balance support: No upper extremity supported Sitting balance-[name]: Fair     Standing balance support: Single extremity supported Standing balance-[name]: Fair Standing balance comment: static fair, dynamic poor.                             Cognition Arousal/Alertness: Suspect due to medications Behavior During Therapy: Restless(close to agitated) Overall Cognitive Status: Impaired/Different from baseline Area of Impairment: Orientation;Safety/judgement;Following commands;Memory;Attention;Awareness;Problem solving                 Orientation Level: Place;Time;Situation Current Attention Level: Sustained Memory: Decreased recall of precautions;Decreased short-term memory Following Commands: Follows one step commands inconsistently Safety/Judgement: Decreased awareness of safety;Decreased awareness of deficits Awareness: Intellectual Problem Solving: Slow processing        Exercises      General Comments        Pertinent Vitals/Pain Pain Assessment: No/denies pain    Home Living                      Prior Function            PT Goals (current goals can now be found in the care plan section) Acute Rehab PT Goals Patient Stated Goal: none stated PT Goal Formulation: With patient Time For Goal Achievement: 07/06/17 Potential to Achieve Goals: Good Progress towards PT goals: Progressing toward goals    Frequency    Min 3X/week      PT Plan Current plan remains appropriate    Co-evaluation              AM-PAC PT "6 Clicks" Daily Activity  Outcome Measure  Difficulty turning over in bed (including adjusting bedclothes, sheets and blankets)?: A Little Difficulty  moving from lying on back to sitting on the side of the bed? : A Little Difficulty sitting down on and standing up from a chair with arms (e.g., wheelchair, bedside commode, etc,.)?: Unable Help needed moving to and from a bed to chair (including a wheelchair)?: A Little Help needed walking in hospital room?: A Little Help needed climbing 3-5 steps with a railing? : A Little 6 Click Score: 16    End of Session   Activity Tolerance: Patient tolerated treatment well Patient left: in  bed;with call bell/phone within reach;with restraints reapplied Nurse Communication: Mobility status PT Visit Diagnosis: Unsteadiness on feet (R26.81);Muscle weakness (generalized) (M62.81);Difficulty in walking, not elsewhere classified (R26.2)     Time: 4098-1191 PT Time Calculation (min) (ACUTE ONLY): 25 min  Charges:  $Gait Training: 8-22 mins                    G Codes:       07/23/17  Donnella Sham, PT [phone]  (pager)   Tessie Fass Ouita Nish 07/23/17, 11:31 AM

## 2017-06-25 ENCOUNTER — Encounter (HOSPITAL_COMMUNITY): Payer: Self-pay | Admitting: Internal Medicine

## 2017-06-25 LAB — CBC
HCT: 21.7 % — ABNORMAL LOW (ref 39.0–52.0)
Hemoglobin: 7.4 g/dL — ABNORMAL LOW (ref 13.0–17.0)
MCH: 32.2 pg (ref 26.0–34.0)
MCHC: 34.1 g/dL (ref 30.0–36.0)
MCV: 94.3 fL (ref 78.0–100.0)
Platelets: 77 10*3/uL — ABNORMAL LOW (ref 150–400)
RBC: 2.3 MIL/uL — ABNORMAL LOW (ref 4.22–5.81)
RDW: 17.7 % — ABNORMAL HIGH (ref 11.5–15.5)
WBC: 8.3 10*3/uL (ref 4.0–10.5)

## 2017-06-25 LAB — BASIC METABOLIC PANEL
Anion gap: 8 (ref 5–15)
BUN: 20 mg/dL (ref 6–20)
CO2: 16 mmol/L — ABNORMAL LOW (ref 22–32)
Calcium: 7.9 mg/dL — ABNORMAL LOW (ref 8.9–10.3)
Chloride: 127 mmol/L — ABNORMAL HIGH (ref 101–111)
Creatinine, Ser: 1.17 mg/dL (ref 0.61–1.24)
GFR calc Af Amer: >60 mL/min (ref >60)
GFR calc non Af Amer: >60 mL/min (ref >60)
Glucose, Bld: 79 mg/dL (ref 65–99)
Potassium: 3.9 mmol/L (ref 3.5–5.1)
Sodium: 151 mmol/L — ABNORMAL HIGH (ref 135–145)

## 2017-06-25 MED ORDER — HALOPERIDOL LACTATE 5 MG/ML IJ SOLN
2.0000 mg | Freq: Once | INTRAMUSCULAR | Status: AC
  Administered 2017-06-25: 2 mg via INTRAVENOUS
  Filled 2017-06-25: qty 1

## 2017-06-25 MED ORDER — AMOXICILLIN 500 MG PO CAPS
1000.0000 mg | ORAL_CAPSULE | Freq: Two times a day (BID) | ORAL | Status: DC
  Administered 2017-06-25 – 2017-06-27 (×5): 1000 mg via ORAL
  Filled 2017-06-25 (×6): qty 2

## 2017-06-25 MED ORDER — AMOXICILLIN 250 MG PO CHEW
1000.0000 mg | CHEWABLE_TABLET | Freq: Two times a day (BID) | ORAL | Status: DC
  Filled 2017-06-25: qty 4

## 2017-06-25 MED ORDER — CLARITHROMYCIN 500 MG PO TABS
500.0000 mg | ORAL_TABLET | Freq: Two times a day (BID) | ORAL | Status: DC
  Administered 2017-06-25 – 2017-06-27 (×5): 500 mg via ORAL
  Filled 2017-06-25 (×6): qty 1

## 2017-06-25 NOTE — Progress Notes (Signed)
PROGRESS NOTE    Mark [name]  KDT:[id number] DOB: [birth year] DOA: 06/17/2017 PCP: [name], MD     Brief Narrative:  Mark [name] is a 62 year old male with history of advanced alcoholic cirrhosis, ongoing alcohol abuse, history of esophageal varices, chronic anemia, hypertension, coronary artery disease with prior MI, thrombocytopenia, severe hypoalbuminemia who presented with generalized weakness, nausea vomiting and right leg swelling. He was found to have severe sepsis, cellulitis of right leg, group A strep bacteremia, hypotension, acute kidney injury. He was briefly in the ICU 48 hours, did not have pressors and was transferred to hospitalist service on 2/23. He then developed active upper GI bleed and required IV PPI, 2u pRBC, and underwent EGD 2/25 which showed ulcerative duodenitis. Hospitalization is further complicated by delirium.   Assessment & Plan:   Active Problems:   Cirrhosis, alcoholic ([name])   Anemia, iron deficiency   Dehydration   Hypotension   Acute on chronic renal failure ([name])   Hyponatremia   Thrombocytopenia ([name])   Sepsis ([name])   Leg edema, right   Acute metabolic encephalopathy   Septic shock ([name])   Cirrhosis ([name])   Coffee ground emesis   Acute on chronic anemia   Duodenal ulcer   Renal failure   Severe sepsis secondary to group A strep bacteremia, right lower extremity cellulitis -Has been on penicillin, completed 48 hours of clindamycin then discontinued. Dr. Broadus John discussed with Dr. Baxter Flattery of infectious disease. Plan for total 14 day course. Can deescalate to PO at discharge.  -Repeat blood cultures negative -Amoxicillin now as patient being tx for triple-tx H Pylori   Upper GI bleed / acute blood loss anemia -Transfused 2u pRBC  -EGD 2/25 with ulcerative duodenitis, continue PPI BID at least 8 weeks and then daily thereafter, monitor Hgb, stop octreotide -H Pylori IgG positive. Spoke with GI, they recommend prolonged  treatment. Will initiate triple-therapy PPI/clarithromycin/amoxicillin for 14 day course  -Monitor Hgb   Acute encephalopathy -Out of window for alcohol withdrawal -Ammonia 19  -Likely medication induced, got worse after ativan per wife. Stop ativan. Will add seroquel qhs  -Stable today, alert although not oriented to place ("by [address]" but does not know that he's in hospital) or year ("[id number]")   Alcoholic cirrhosis of the liver -Patient being followed as an outpatient by Dr. Ardis Hughs, he unfortunately continues to drink about 1 - 40 ounce beer per day -Continue lactulose, nadolol, started rifaximin  -Hold spironolactone, furosemide  Acute kidney injury on chronic kidney disease stage II-III -Baseline creatinine around 1.1-1.2, creatinine 6.5 on admission -Resolved   Thrombocytopenia -Likely in the setting of liver disease, no bleeding, monitor -Stable   DVT prophylaxis: SCD Code Status: Full Family Communication: No family at bedside, called wife with no answer on phone  Disposition Plan: Pending improvement of mentation    Consultants:   PCCM  Nephrology  GI  Procedures:   EGD 2/25   Antimicrobials:  Anti-infectives (From admission, onward)   Start     Dose/Rate Route Frequency Ordered Stop   06/25/17 1000  clarithromycin (BIAXIN) tablet 500 mg     500 mg Oral Every 12 hours 06/25/17 0938     06/25/17 1000  amoxicillin (AMOXIL) chewable tablet 1,000 mg  Status:  Discontinued     1,000 mg Oral Every 12 hours 06/25/17 0938 06/25/17 0952   06/25/17 1000  amoxicillin (AMOXIL) capsule 1,000 mg     1,000 mg Oral Every 12 hours 06/25/17 0952  06/24/17 1000  rifaximin (XIFAXAN) tablet 550 mg     550 mg Oral 2 times daily 06/24/17 0910     06/23/17 2000  penicillin G potassium 3 Million Units in dextrose 5 % 100 mL IVPB  Status:  Discontinued     3 Million Units 200 mL/hr over 30 Minutes Intravenous Every 4 hours 06/23/17 1623 06/25/17 0938   06/20/17 0400   penicillin G potassium 3 Million Units in dextrose 8mL IVPB  Status:  Discontinued     3 Million Units 100 mL/hr over 30 Minutes Intravenous Every 4 hours 06/19/17 1939 06/23/17 1623   06/19/17 1700  vancomycin (VANCOCIN) IVPB 1000 mg/200 mL premix  Status:  Discontinued     1,000 mg 200 mL/hr over 60 Minutes Intravenous Every 48 hours 06/18/17 0730 06/18/17 0905   06/19/17 1600  penicillin G potassium 3 Million Units in dextrose 59mL IVPB     3 Million Units 100 mL/hr over 30 Minutes Intravenous Every 4 hours 06/19/17 1332 06/19/17 2209   06/18/17 1400  penicillin G potassium 3 Million Units in dextrose 23mL IVPB  Status:  Discontinued     3 Million Units 100 mL/hr over 30 Minutes Intravenous Every 8 hours 06/18/17 0905 06/19/17 1332   06/18/17 0915  clindamycin (CLEOCIN) IVPB 900 mg  Status:  Discontinued     900 mg 100 mL/hr over 30 Minutes Intravenous Every 8 hours 06/18/17 0905 06/20/17 1020   06/18/17 0800  piperacillin-tazobactam (ZOSYN) IVPB 2.25 g  Status:  Discontinued     2.25 g 100 mL/hr over 30 Minutes Intravenous Every 8 hours 06/18/17 0730 06/18/17 0905   06/17/17 1630  vancomycin (VANCOCIN) 1,250 mg in sodium chloride 0.9 % 250 mL IVPB     1,250 mg 166.7 mL/hr over 90 Minutes Intravenous  Once 06/17/17 1557 06/17/17 1854   06/17/17 1600  piperacillin-tazobactam (ZOSYN) IVPB 3.375 g     3.375 g 100 mL/hr over 30 Minutes Intravenous  Once 06/17/17 1557 06/17/17 1753       Subjective: States he is doing fairly well. He appears confused but is alert and verbal, able to answer some questions and asks me to call his wife as she is concerned about him  Objective: Vitals:   06/24/17 1650 06/24/17 2124 06/25/17 0630 06/25/17 0932  BP: 138/85 140/78 137/80 139/78  Pulse: 74 76 75 74  Resp: 18 18 18 19   Temp: 98.3 F (36.8 C) 98.4 F (36.9 C) 98.7 F (37.1 C) 98.6 F (37 C)  TempSrc: Oral Oral Oral Oral  SpO2: 94% 95% 95% 98%  Weight:      Height:         Intake/Output Summary (Last 24 hours) at 06/25/2017 1134 Last data filed at 06/25/2017 0824 Gross per 24 hour  Intake 740 ml  Output 600 ml  Net 140 ml   Filed Weights   06/22/17 1035 06/22/17 2130 06/23/17 2138  Weight: 70.8 kg (156 lb) 72.7 kg (160 lb 4.4 oz) 72.5 kg (159 lb 13.3 oz)    Examination: General exam: Appears calm and comfortable  Respiratory system: Clear to auscultation. Respiratory effort normal. Cardiovascular system: S1 & S2 heard, RRR. No JVD, murmurs, rubs, gallops or clicks. No pedal edema. Gastrointestinal system: Abdomen is nondistended, soft and nontender. No organomegaly or masses felt. Normal bowel sounds heard. Central nervous system: Alert, not oriented to place or year  Extremities: Symmetric 5 x 5 power. Skin: Open wound right dorsal foot without drainage    Data  Reviewed: I have personally reviewed following labs and imaging studies  CBC: Recent Labs  Lab 06/21/17 0724 06/21/17 0945 06/22/17 0715 06/23/17 1013 06/25/17 0629  WBC 17.5* 15.8* 14.7* 10.0 8.3  NEUTROABS  --   --  11.5*  --   --   HGB 6.6* 6.7* 8.8* 8.3* 7.4*  HCT 18.2* 20.1* 24.4* 23.9* 21.7*  MCV 89.7 102.6* 86.8 89.5 94.3  PLT 50* 69* 47* 68* 77*   Basic Metabolic Panel: Recent Labs  Lab 06/20/17 0757 06/21/17 0724 06/22/17 0715 06/23/17 1013 06/25/17 0629  NA 137 140 141 144 151*  K 3.7 3.5 4.2 4.1 3.9  CL 114* 117* 118* 121* 127*  CO2 13* 15* 17* 17* 16*  GLUCOSE 85 127* 96 88 79  BUN 55* 51* 52* 37* 20  CREATININE 2.27* 1.66* 1.48* 1.29* 1.17  CALCIUM 6.8* 7.0* 7.3* 7.5* 7.9*   GFR: Estimated Creatinine Clearance: 59.4 mL/min (by C-G formula based on SCr of 1.17 mg/dL). Liver Function Tests: Recent Labs  Lab 06/21/17 0724  AST 83*  ALT 24  ALKPHOS 136*  BILITOT 1.7*  PROT 4.8*  ALBUMIN 1.4*   No results for input(s): LIPASE, AMYLASE in the last 168 hours. Recent Labs  Lab 06/23/17 1023  AMMONIA 19   Coagulation Profile: No results for  input(s): INR, PROTIME in the last 168 hours. Cardiac Enzymes: No results for input(s): CKTOTAL, CKMB, CKMBINDEX, TROPONINI in the last 168 hours. BNP (last 3 results) No results for input(s): PROBNP in the last 8760 hours. HbA1C: No results for input(s): HGBA1C in the last 72 hours. CBG: Recent Labs  Lab 06/20/17 0459 06/20/17 0817 06/20/17 1133 06/20/17 1703 06/20/17 2113  GLUCAP 97 92 80 116* 123*   Lipid Profile: No results for input(s): CHOL, HDL, LDLCALC, TRIG, CHOLHDL, LDLDIRECT in the last 72 hours. Thyroid Function Tests: No results for input(s): TSH, T4TOTAL, FREET4, T3FREE, THYROIDAB in the last 72 hours. Anemia Panel: No results for input(s): VITAMINB12, FOLATE, FERRITIN, TIBC, IRON, RETICCTPCT in the last 72 hours. Sepsis Labs: Recent Labs  Lab 06/19/17 0320  LATICACIDVEN 1.9    Recent Results (from the past 240 hour(s))  Blood culture (routine x 2)     Status: Abnormal   Collection Time: 06/17/17  5:09 PM  Result Value Ref Range Status   Specimen Description BLOOD RIGHT ANTECUBITAL  Final   Special Requests   Final    BOTTLES DRAWN AEROBIC AND ANAEROBIC Blood Culture adequate volume   Culture  Setup Time   Final    GRAM POSITIVE COCCI IN BOTH AEROBIC AND ANAEROBIC BOTTLES CRITICAL RESULT CALLED TO, READ BACK BY AND VERIFIED WITH: K PATTON PHARMD 06/18/17 0735 JDW    Culture (A)  Final    GROUP A STREP (S.PYOGENES) ISOLATED HEALTH DEPARTMENT NOTIFIED Performed at [facility], [place] [address], [place], [place] [address]    Report Status 06/20/2017 FINAL  Final   Organism ID, Bacteria GROUP A STREP (S.PYOGENES) ISOLATED  Final      Susceptibility   Group a strep (s.pyogenes) isolated - MIC*    PENICILLIN <=0.06 SENSITIVE Sensitive     CEFTRIAXONE <=0.12 SENSITIVE Sensitive     ERYTHROMYCIN <=0.12 SENSITIVE Sensitive     LEVOFLOXACIN 0.5 SENSITIVE Sensitive     VANCOMYCIN <=0.12 SENSITIVE Sensitive     * GROUP A STREP (S.PYOGENES) ISOLATED    Blood culture (routine x 2)     Status: Abnormal   Collection Time: 06/17/17  5:26 PM  Result  Value Ref Range Status   Specimen Description BLOOD LEFT ANTECUBITAL  Final   Special Requests   Final    BOTTLES DRAWN AEROBIC AND ANAEROBIC Blood Culture adequate volume   Culture  Setup Time   Final    GRAM POSITIVE COCCI IN BOTH AEROBIC AND ANAEROBIC BOTTLES CRITICAL RESULT CALLED TO, READ BACK BY AND VERIFIED WITH: K PATTON PHARMD 06/18/17 0735 JDW    Culture (A)  Final    GROUP A STREP (S.PYOGENES) ISOLATED SUSCEPTIBILITIES PERFORMED ON PREVIOUS CULTURE WITHIN THE LAST 5 DAYS. HEALTH DEPARTMENT NOTIFIED Performed at [facility], [place] [address], [place], [place] [address]    Report Status 06/20/2017 FINAL  Final  Blood Culture ID Panel (Reflexed)     Status: Abnormal   Collection Time: 06/17/17  5:26 PM  Result Value Ref Range Status   Enterococcus species NOT DETECTED NOT DETECTED Final   Listeria monocytogenes NOT DETECTED NOT DETECTED Final   Staphylococcus species NOT DETECTED NOT DETECTED Final   Staphylococcus aureus NOT DETECTED NOT DETECTED Final   Streptococcus species DETECTED (A) NOT DETECTED Final    Comment: CRITICAL RESULT CALLED TO, READ BACK BY AND VERIFIED WITH: K PATTON PHARMD 06/18/17 0735 JDW    Streptococcus agalactiae NOT DETECTED NOT DETECTED Final   Streptococcus pneumoniae NOT DETECTED NOT DETECTED Final   Streptococcus pyogenes DETECTED (A) NOT DETECTED Final    Comment: CRITICAL RESULT CALLED TO, READ BACK BY AND VERIFIED WITH: K PATTON PHARMD 06/18/17 0735 JDW    Acinetobacter baumannii NOT DETECTED NOT DETECTED Final   Enterobacteriaceae species NOT DETECTED NOT DETECTED Final   Enterobacter cloacae complex NOT DETECTED NOT DETECTED Final   Escherichia coli NOT DETECTED NOT DETECTED Final   Klebsiella oxytoca NOT DETECTED NOT DETECTED Final   Klebsiella pneumoniae NOT DETECTED NOT DETECTED Final   Proteus species NOT DETECTED NOT DETECTED Final    Serratia marcescens NOT DETECTED NOT DETECTED Final   Haemophilus influenzae NOT DETECTED NOT DETECTED Final   Neisseria meningitidis NOT DETECTED NOT DETECTED Final   Pseudomonas aeruginosa NOT DETECTED NOT DETECTED Final   Candida albicans NOT DETECTED NOT DETECTED Final   Candida glabrata NOT DETECTED NOT DETECTED Final   Candida krusei NOT DETECTED NOT DETECTED Final   Candida parapsilosis NOT DETECTED NOT DETECTED Final   Candida tropicalis NOT DETECTED NOT DETECTED Final    Comment: Performed at [facility], [place] [address], [place], [place] [address]  Urine Culture     Status: None   Collection Time: 06/17/17 10:04 PM  Result Value Ref Range Status   Specimen Description URINE, CLEAN CATCH  Final   Special Requests NONE  Final   Culture   Final    NO GROWTH Performed at [facility], [place] [address] [place], [place] [address]    Report Status 06/19/2017 FINAL  Final  Gastrointestinal Panel by PCR , Stool     Status: None   Collection Time: 06/18/17  6:41 PM  Result Value Ref Range Status   Campylobacter species NOT DETECTED NOT DETECTED Final   Plesimonas shigelloides NOT DETECTED NOT DETECTED Final   Salmonella species NOT DETECTED NOT DETECTED Final   Yersinia enterocolitica NOT DETECTED NOT DETECTED Final   Vibrio species NOT DETECTED NOT DETECTED Final   Vibrio cholerae NOT DETECTED NOT DETECTED Final   Enteroaggregative E coli (EAEC) NOT DETECTED NOT DETECTED Final   Enteropathogenic E coli (EPEC) NOT DETECTED NOT DETECTED Final   Enterotoxigenic E  coli (ETEC) NOT DETECTED NOT DETECTED Final   Shiga like toxin producing E coli (STEC) NOT DETECTED NOT DETECTED Final   Shigella/Enteroinvasive E coli (EIEC) NOT DETECTED NOT DETECTED Final   Cryptosporidium NOT DETECTED NOT DETECTED Final   Cyclospora cayetanensis NOT DETECTED NOT DETECTED Final   Entamoeba histolytica NOT DETECTED NOT DETECTED Final   Giardia lamblia NOT DETECTED NOT DETECTED Final    Adenovirus F40/41 NOT DETECTED NOT DETECTED Final   Astrovirus NOT DETECTED NOT DETECTED Final   Norovirus GI/GII NOT DETECTED NOT DETECTED Final   Rotavirus A NOT DETECTED NOT DETECTED Final   Sapovirus (I, II, IV, and V) NOT DETECTED NOT DETECTED Final    Comment: Performed at [facility], [place] [place], [place] [address]  MRSA PCR Screening     Status: Abnormal   Collection Time: 06/18/17  8:10 PM  Result Value Ref Range Status   MRSA by PCR POSITIVE (A) NEGATIVE Final    Comment:        The GeneXpert MRSA Assay (FDA approved for NASAL specimens only), is one component of a comprehensive MRSA colonization surveillance program. It is not intended to diagnose MRSA infection nor to guide or monitor treatment for MRSA infections. RESULT CALLED TO, READ BACK BY AND VERIFIED WITH: Z.HASSAN,RN AT 0043 06/19/17 L.PITT   Culture, blood (routine x 2)     Status: None (Preliminary result)   Collection Time: 06/21/17 11:45 AM  Result Value Ref Range Status   Specimen Description BLOOD LEFT HAND  Final   Special Requests IN PEDIATRIC BOTTLE Blood Culture adequate volume  Final   Culture   Final    NO GROWTH 3 DAYS Performed at [facility], [place] [address], [name], [place] [address]    Report Status PENDING  Incomplete       Radiology Studies: No results found.    Scheduled Meds: . amoxicillin  1,000 mg Oral Q12H  . clarithromycin  500 mg Oral Q12H  . folic acid  1 mg Oral Daily  . lactulose  30 g Oral TID  . nadolol  20 mg Oral Daily  . pantoprazole  40 mg Oral BID  . QUEtiapine  25 mg Oral QHS  . rifaximin  550 mg Oral BID  . thiamine  100 mg Oral Daily   Continuous Infusions:    LOS: 8 days    Time spent: 30 minutes   Dessa Phi, DO [facility] [email or web address] Password [facility] 06/25/2017, 11:34 AM

## 2017-06-26 LAB — BASIC METABOLIC PANEL
Anion gap: 8 (ref 5–15)
BUN: 15 mg/dL (ref 6–20)
CO2: 16 mmol/L — ABNORMAL LOW (ref 22–32)
Calcium: 7.8 mg/dL — ABNORMAL LOW (ref 8.9–10.3)
Chloride: 128 mmol/L — ABNORMAL HIGH (ref 101–111)
Creatinine, Ser: 1.08 mg/dL (ref 0.61–1.24)
GFR calc Af Amer: >60 mL/min (ref >60)
GFR calc non Af Amer: >60 mL/min (ref >60)
Glucose, Bld: 100 mg/dL — ABNORMAL HIGH (ref 65–99)
Potassium: 3.5 mmol/L (ref 3.5–5.1)
Sodium: 152 mmol/L — ABNORMAL HIGH (ref 135–145)

## 2017-06-26 LAB — CULTURE, BLOOD (ROUTINE X 2)
Culture: NO GROWTH
Special Requests: ADEQUATE

## 2017-06-26 LAB — CBC
HCT: 21.7 % — ABNORMAL LOW (ref 39.0–52.0)
Hemoglobin: 7.2 g/dL — ABNORMAL LOW (ref 13.0–17.0)
MCH: 32 pg (ref 26.0–34.0)
MCHC: 33.2 g/dL (ref 30.0–36.0)
MCV: 96.4 fL (ref 78.0–100.0)
Platelets: 90 10*3/uL — ABNORMAL LOW (ref 150–400)
RBC: 2.25 MIL/uL — ABNORMAL LOW (ref 4.22–5.81)
RDW: 18.7 % — ABNORMAL HIGH (ref 11.5–15.5)
WBC: 7.3 10*3/uL (ref 4.0–10.5)

## 2017-06-26 MED ORDER — DEXTROSE-NACL 5-0.45 % IV SOLN
INTRAVENOUS | Status: DC
  Administered 2017-06-26 (×2): via INTRAVENOUS

## 2017-06-26 NOTE — Progress Notes (Signed)
Physical Therapy Treatment Patient Details Name: Mark [name] MRN: [id number] DOB: 1955-08-06 Today's Date: 06/26/2017    History of Present Illness Pt is a 62 y/o male presenting with generalized fatigue, nausea, vomiting, R LE swelling, flu like symptoms. Admitted with severe dehydration and sepsis, acute on chronic kidney failure. PMH alcohol abuse, cirrhosis, heart murmur, HTN, LE swelling, hx MI, hx CVA, hx hernia repair, B knee arthroscopy, hx TKR     PT Comments    Pt continues to demonstrate impulsivity and cognitive deficits putting him at a higher risk for falls. Pt unable to self-pace with mobility and completely unaware of having a BM during standing and with ambulation. If pt's family cannot provide 24/7 supervision/assistance, pt will need SNF for ST rehab. Pt would continue to benefit from skilled physical therapy services at this time while admitted and after d/c to address the below listed limitations in order to improve overall safety and independence with functional mobility.    Follow Up Recommendations  Home health PT;Supervision/Assistance - 24 hour;Other (comment)(WILL NEED SNF IF FAMILY CANNOT PROVIDE 24/7 SUPERVISION)      Equipment Recommendations  Rolling walker with 5" wheels    Recommendations for Other Services       Precautions / Restrictions Precautions Precautions: Fall Restrictions Weight Bearing Restrictions: No    Mobility  Bed Mobility Overal bed mobility: Needs Assistance Bed Mobility: Sit to Supine     Supine to sit: Supervision Sit to supine: Min assist   General bed mobility comments: increased time and effort, assist for LEs back into bed  Transfers Overall transfer level: Needs assistance Equipment used: Rolling walker (2 wheeled) Transfers: Sit to/from [facility] Sit to Stand: Min assist;Min guard Stand pivot transfers: Min assist       General transfer comment: pt required constant cueing for safe  hand placement, close min guard to min A for stability with transitional movement  Ambulation/Gait Ambulation/Gait assistance: Min guard Ambulation Distance (Feet): 75 Feet(75' x3 with sitting rest breaks) Assistive device: Rolling walker (2 wheeled) Gait Pattern/deviations: Step-through pattern Gait velocity: decreased Gait velocity interpretation: Below normal speed for age/gender General Gait Details: pt with mild instability but no overt LOB or need for physical assistance; however, close min guard for safety and pt unable to self-pace, fatiguing quickly with ambulation   Stairs            Wheelchair Mobility    Modified Rankin (Stroke Patients Only)       Balance Overall balance assessment: Needs assistance Sitting-balance support: No upper extremity supported Sitting balance-[name]: Fair     Standing balance support: Bilateral upper extremity supported Standing balance-[name]: Poor Standing balance comment: requires B UE support                            Cognition Arousal/Alertness: Awake/alert Behavior During Therapy: Impulsive Overall Cognitive Status: Impaired/Different from baseline Area of Impairment: Orientation;Safety/judgement;Following commands;Problem solving                 Orientation Level: Situation;Time Current Attention Level: Sustained Memory: Decreased recall of precautions;Decreased short-term memory Following Commands: Follows one step commands inconsistently;Follows one step commands with increased time Safety/Judgement: Decreased awareness of safety;Decreased awareness of deficits   Problem Solving: Slow processing;Difficulty sequencing;Requires verbal cues General Comments: pt unaware of bowel incontinence, did not initiate need to use [facility]      Exercises      General Comments  Pertinent Vitals/Pain Pain Assessment: No/denies pain    Home Living                      Prior Function             PT Goals (current goals can now be found in the care plan section) Acute Rehab PT Goals Patient Stated Goal: to walk PT Goal Formulation: With patient Time For Goal Achievement: 07/06/17 Potential to Achieve Goals: Good Progress towards PT goals: Progressing toward goals    Frequency    Min 3X/week      PT Plan Discharge plan needs to be updated    Co-evaluation              AM-PAC PT "6 Clicks" Daily Activity  Outcome Measure  Difficulty turning over in bed (including adjusting bedclothes, sheets and blankets)?: A Little Difficulty moving from lying on back to sitting on the side of the bed? : Unable Difficulty sitting down on and standing up from a chair with arms (e.g., wheelchair, bedside commode, etc,.)?: Unable Help needed moving to and from a bed to chair (including a wheelchair)?: A Little Help needed walking in hospital room?: A Little Help needed climbing 3-5 steps with a railing? : A Lot 6 Click Score: 13    End of Session Equipment Utilized During Treatment: Gait belt Activity Tolerance: Patient tolerated treatment well Patient left: in bed;with call bell/phone within reach;with bed alarm set Nurse Communication: Mobility status PT Visit Diagnosis: Unsteadiness on feet (R26.81);Muscle weakness (generalized) (M62.81);Difficulty in walking, not elsewhere classified (R26.2)     Time: 9480-1655 PT Time Calculation (min) (ACUTE ONLY): 33 min  Charges:  $Gait Training: 8-22 mins                    G Codes:       [place], [place], [place] 06/26/2017, 1:15 PM

## 2017-06-26 NOTE — Progress Notes (Signed)
Occupational Therapy Treatment Patient Details Name: Mark [name] MRN: [id number] DOB: [birth year] Today's Date: 06/26/2017    History of present illness 62yo male presenting with generalized fatigue, nausea, vomiting, R LE swelling, flu like symptoms. Admitted with severe dehydration and sepsis, acute on chronic kidney failure. PMH alcohol abuse, cirrhosis, heart murmur, HTN, LE swelling, hx MI, hx CVA, hx hernia repair, B knee arthroscopy, hx TKR    OT comments  Pt with improved cognition, but remains impulsive with poor awareness of safety and deficits. He requires set up to max assist for ADL and min to min guard assist for mobility. Pt will need SNF if family cannot provide 24 hour care. Pt grateful for activity at end of session.  Follow Up Recommendations  Home health OT;Supervision/Assistance - 24 hour    Equipment Recommendations  3 in 1 bedside commode    Recommendations for Other Services      Precautions / Restrictions Precautions Precautions: Fall Restrictions Weight Bearing Restrictions: No       Mobility Bed Mobility Overal bed mobility: Needs Assistance Bed Mobility: Supine to Sit;Sit to Supine     Supine to sit: Supervision Sit to supine: Min assist   General bed mobility comments: increased time and effort, assist for LEs back into bed  Transfers Overall transfer level: Needs assistance Equipment used: Rolling walker (2 wheeled) Transfers: Sit to/from Stand Sit to Stand: Min assist;Min guard         General transfer comment: cues for hand placement from bed, chair and 3 in 1    Balance Overall balance assessment: Needs assistance   Sitting balance-[name]: Fair     Standing balance support: Bilateral upper extremity supported Standing balance-[name]: Poor Standing balance comment: requires B UE support                           ADL either performed or assessed with clinical judgement   ADL Overall ADL's : Needs  assistance/impaired Eating/Feeding: Independent;Bed level Eating/Feeding Details (indicate cue type and reason): some incoordination with opening straw Grooming: Wash/dry hands;Sitting;Set up           Upper Body Dressing : Minimal assistance;Sitting   Lower Body Dressing: Maximal assistance;Sit to/from stand   Toilet Transfer: Minimal assistance;BSC;RW;Stand-pivot   Toileting- Clothing Manipulation and Hygiene: Minimal assistance;Sitting/lateral [name] Toileting - Clothing Manipulation Details (indicate cue type and reason): decreased thoroughness with pericare     Functional mobility during ADLs: Minimal assistance;Rolling walker;Cueing for safety General ADL Comments: Improved cognition this visit, but remains unsafe and a high fall risk.     Vision       Perception     Praxis      Cognition Arousal/Alertness: Awake/alert Behavior During Therapy: Impulsive Overall Cognitive Status: Impaired/Different from baseline Area of Impairment: Orientation;Safety/judgement;Following commands;Problem solving                 Orientation Level: Situation;Time Current Attention Level: Sustained Memory: Decreased recall of precautions;Decreased short-term memory Following Commands: Follows one step commands inconsistently;Follows one step commands with increased time Safety/Judgement: Decreased awareness of safety;Decreased awareness of deficits   Problem Solving: Slow processing;Difficulty sequencing;Requires verbal cues General Comments: pt unaware of bowel incontinence, did not initiate need to use [facility]        Exercises     Shoulder Instructions       General Comments      Pertinent Vitals/ Pain       Pain Assessment:  No/denies pain  Home Living                                          Prior Functioning/Environment              Frequency  Min 2X/week        Progress Toward Goals  OT Goals(current goals can now be found in the  care plan section)  Progress towards OT goals: Progressing toward goals  Acute Rehab OT Goals Patient Stated Goal: to walk OT Goal Formulation: With patient Time For Goal Achievement: 07/07/17 Potential to Achieve Goals: [place] Discharge plan needs to be updated    Co-evaluation                 AM-PAC PT "6 Clicks" Daily Activity     Outcome Measure   Help from another person eating meals?: None Help from another person taking care of personal grooming?: A Little Help from another person toileting, which includes using toliet, bedpan, or urinal?: A Lot Help from another person bathing (including washing, rinsing, drying)?: A Lot Help from another person to put on and taking off regular upper body clothing?: A Little Help from another person to put on and taking off regular lower body clothing?: A Lot 6 Click Score: 16    End of Session Equipment Utilized During Treatment: Gait belt;Rolling walker  OT Visit Diagnosis: Unsteadiness on feet (R26.81);Other abnormalities of gait and mobility (R26.89);Other symptoms and signs involving cognitive function   Activity Tolerance Patient tolerated treatment well   Patient Left in bed;with call bell/phone within reach;with bed alarm set   Nurse Communication          Time: 9357-0177 OT Time Calculation (min): 45 min  Charges: OT General Charges $OT Visit: 1 Visit OT Treatments $Self Care/Home Management : 23-37 mins  06/26/2017 Mark [name], OTR/L Pager: [phone] Mark [name] Mark [name] 06/26/2017, 12:45 PM

## 2017-06-26 NOTE — Progress Notes (Signed)
Unable to assess for IV when consult entered due to pt up in chair eating and requested return after 7pm. Currently pt in chair and RN states will be placed in bed after report.

## 2017-06-26 NOTE — Progress Notes (Addendum)
PROGRESS NOTE    Mark [name]  AYT:[id number] DOB: 09/17/1955 DOA: 06/17/2017 PCP: [name], MD     Brief Narrative:  Mark [name] is a 62 year old male with history of advanced alcoholic cirrhosis, ongoing alcohol abuse, history of esophageal varices, chronic anemia, hypertension, coronary artery disease with prior MI, thrombocytopenia, severe hypoalbuminemia who presented with generalized weakness, nausea vomiting and right leg swelling. He was found to have severe sepsis, cellulitis of right leg, group A strep bacteremia, hypotension, acute kidney injury. He was briefly in the ICU 48 hours, did not have pressors and was transferred to hospitalist service on 2/23. He then developed active upper GI bleed and required IV PPI, 2u pRBC, and underwent EGD 2/25 which showed ulcerative duodenitis. Hospitalization is further complicated by delirium.   Assessment & Plan:   Active Problems:   Cirrhosis, alcoholic ([name])   Anemia, iron deficiency   Dehydration   Hypotension   Acute on chronic renal failure ([name])   Hyponatremia   Thrombocytopenia ([name])   Sepsis ([name])   Leg edema, right   Acute metabolic encephalopathy   Septic shock ([name])   Cirrhosis ([name])   Coffee ground emesis   Acute on chronic anemia   Duodenal ulcer   Renal failure   Severe sepsis secondary to group A strep bacteremia, right lower extremity cellulitis -Has been on penicillin, completed 48 hours of clindamycin then discontinued. Dr. Broadus John discussed with Dr. Baxter Flattery of infectious disease. Plan for total 14 day course. Can deescalate to PO at discharge.  -Repeat blood cultures negative -Amoxicillin now as patient being tx for triple-tx H Pylori   Upper GI bleed / acute blood loss anemia -Transfused 2u pRBC  -EGD 2/25 with ulcerative duodenitis, continue PPI BID at least 8 weeks and then daily thereafter, monitor Hgb, stop octreotide -H Pylori IgG positive. Spoke with GI, they recommend prolonged  treatment. Will initiate triple-therapy PPI/clarithromycin/amoxicillin for 14 day course  -Monitor Hgb, stable   Acute encephalopathy -Out of window for alcohol withdrawal -Ammonia 19  -Likely medication induced, got worse after ativan per wife. Stop ativan. Will add seroquel qhs  -Improved today. He is oriented to self, place [facility] hospital), and year (2019). He is off restraints this morning and no signs of delirium.   Alcoholic cirrhosis of the liver -Patient being followed as an outpatient by Dr. Ardis Hughs, he unfortunately continues to drink about 1 - 40 ounce beer per day -Continue lactulose, nadolol, started rifaximin  -Hold spironolactone, furosemide  Acute kidney injury on chronic kidney disease stage II-III -Baseline creatinine around 1.1-1.2, creatinine 6.5 on admission -Resolved   Thrombocytopenia -Likely in the setting of liver disease, no bleeding, monitor -Stable   Hypernatremia -Start D5-0.45NS IVF today, trend BMP    DVT prophylaxis: SCD Code Status: Full Family Communication: Wife at bedside   Disposition Plan: Pending stabilization, hopefully home 3/2 if mentation remains stable    Consultants:   PCCM  Nephrology  GI  Procedures:   EGD 2/25   Antimicrobials:  Anti-infectives (From admission, onward)   Start     Dose/Rate Route Frequency Ordered Stop   06/25/17 1000  clarithromycin (BIAXIN) tablet 500 mg     500 mg Oral Every 12 hours 06/25/17 0938     06/25/17 1000  amoxicillin (AMOXIL) chewable tablet 1,000 mg  Status:  Discontinued     1,000 mg Oral Every 12 hours 06/25/17 0938 06/25/17 0952   06/25/17 1000  amoxicillin (AMOXIL) capsule 1,000 mg  1,000 mg Oral Every 12 hours 06/25/17 0952     06/24/17 1000  rifaximin (XIFAXAN) tablet 550 mg     550 mg Oral 2 times daily 06/24/17 0910     06/23/17 2000  penicillin G potassium 3 Million Units in dextrose 5 % 100 mL IVPB  Status:  Discontinued     3 Million Units 200 mL/hr over 30  Minutes Intravenous Every 4 hours 06/23/17 1623 06/25/17 0938   06/20/17 0400  penicillin G potassium 3 Million Units in dextrose 80mL IVPB  Status:  Discontinued     3 Million Units 100 mL/hr over 30 Minutes Intravenous Every 4 hours 06/19/17 1939 06/23/17 1623   06/19/17 1700  vancomycin (VANCOCIN) IVPB 1000 mg/200 mL premix  Status:  Discontinued     1,000 mg 200 mL/hr over 60 Minutes Intravenous Every 48 hours 06/18/17 0730 06/18/17 0905   06/19/17 1600  penicillin G potassium 3 Million Units in dextrose 48mL IVPB     3 Million Units 100 mL/hr over 30 Minutes Intravenous Every 4 hours 06/19/17 1332 06/19/17 2209   06/18/17 1400  penicillin G potassium 3 Million Units in dextrose 66mL IVPB  Status:  Discontinued     3 Million Units 100 mL/hr over 30 Minutes Intravenous Every 8 hours 06/18/17 0905 06/19/17 1332   06/18/17 0915  clindamycin (CLEOCIN) IVPB 900 mg  Status:  Discontinued     900 mg 100 mL/hr over 30 Minutes Intravenous Every 8 hours 06/18/17 0905 06/20/17 1020   06/18/17 0800  piperacillin-tazobactam (ZOSYN) IVPB 2.25 g  Status:  Discontinued     2.25 g 100 mL/hr over 30 Minutes Intravenous Every 8 hours 06/18/17 0730 06/18/17 0905   06/17/17 1630  vancomycin (VANCOCIN) 1,250 mg in sodium chloride 0.9 % 250 mL IVPB     1,250 mg 166.7 mL/hr over 90 Minutes Intravenous  Once 06/17/17 1557 06/17/17 1854   06/17/17 1600  piperacillin-tazobactam (ZOSYN) IVPB 3.375 g     3.375 g 100 mL/hr over 30 Minutes Intravenous  Once 06/17/17 1557 06/17/17 1753       Subjective: No new issues. Wife at bedside states patient slept most of the night.    Objective: Vitals:   06/25/17 1651 06/25/17 2211 06/26/17 0602 06/26/17 0945  BP: 140/66 (!) 145/75 138/68 (!) 146/72  Pulse: 84 80 82 84  Resp: 18 18 18 18   Temp: 98.3 F (36.8 C) 98.3 F (36.8 C) 98.6 F (37 C) 98.2 F (36.8 C)  TempSrc: Oral Oral Oral Oral  SpO2: 99% 98% 98% 98%  Weight:      Height:         Intake/Output Summary (Last 24 hours) at 06/26/2017 1125 Last data filed at 06/26/2017 0602 Gross per 24 hour  Intake 0 ml  Output 0 ml  Net 0 ml   Filed Weights   06/22/17 1035 06/22/17 2130 06/23/17 2138  Weight: 70.8 kg (156 lb) 72.7 kg (160 lb 4.4 oz) 72.5 kg (159 lb 13.3 oz)    Examination: General exam: Appears calm and comfortable  Respiratory system: Clear to auscultation. Respiratory effort normal. Cardiovascular system: S1 & S2 heard, RRR. No JVD, murmurs, rubs, gallops or clicks. No pedal edema. Gastrointestinal system: Abdomen is nondistended, soft and nontender. No organomegaly or masses felt. Normal bowel sounds heard. Central nervous system: Alert and oriented x 3. Answers questions appropriately.  Extremities: Symmetric 5 x 5 power.    Data Reviewed: I have personally reviewed following labs and imaging studies  CBC: Recent Labs  Lab 06/21/17 0945 06/22/17 0715 06/23/17 1013 06/25/17 0629 06/26/17 0518  WBC 15.8* 14.7* 10.0 8.3 7.3  NEUTROABS  --  11.5*  --   --   --   HGB 6.7* 8.8* 8.3* 7.4* 7.2*  HCT 20.1* 24.4* 23.9* 21.7* 21.7*  MCV 102.6* 86.8 89.5 94.3 96.4  PLT 69* 47* 68* 77* 90*   Basic Metabolic Panel: Recent Labs  Lab 06/21/17 0724 06/22/17 0715 06/23/17 1013 06/25/17 0629 06/26/17 0518  NA 140 141 144 151* 152*  K 3.5 4.2 4.1 3.9 3.5  CL 117* 118* 121* 127* 128*  CO2 15* 17* 17* 16* 16*  GLUCOSE 127* 96 88 79 100*  BUN 51* 52* 37* 20 15  CREATININE 1.66* 1.48* 1.29* 1.17 1.08  CALCIUM 7.0* 7.3* 7.5* 7.9* 7.8*   GFR: Estimated Creatinine Clearance: 64.4 mL/min (by C-G formula based on SCr of 1.08 mg/dL). Liver Function Tests: Recent Labs  Lab 06/21/17 0724  AST 83*  ALT 24  ALKPHOS 136*  BILITOT 1.7*  PROT 4.8*  ALBUMIN 1.4*   No results for input(s): LIPASE, AMYLASE in the last 168 hours. Recent Labs  Lab 06/23/17 1023  AMMONIA 19   Coagulation Profile: No results for input(s): INR, PROTIME in the last 168  hours. Cardiac Enzymes: No results for input(s): CKTOTAL, CKMB, CKMBINDEX, TROPONINI in the last 168 hours. BNP (last 3 results) No results for input(s): PROBNP in the last 8760 hours. HbA1C: No results for input(s): HGBA1C in the last 72 hours. CBG: Recent Labs  Lab 06/20/17 0459 06/20/17 0817 06/20/17 1133 06/20/17 1703 06/20/17 2113  GLUCAP 97 92 80 116* 123*   Lipid Profile: No results for input(s): CHOL, HDL, LDLCALC, TRIG, CHOLHDL, LDLDIRECT in the last 72 hours. Thyroid Function Tests: No results for input(s): TSH, T4TOTAL, FREET4, T3FREE, THYROIDAB in the last 72 hours. Anemia Panel: No results for input(s): VITAMINB12, FOLATE, FERRITIN, TIBC, IRON, RETICCTPCT in the last 72 hours. Sepsis Labs: No results for input(s): PROCALCITON, LATICACIDVEN in the last 168 hours.  Recent Results (from the past 240 hour(s))  Blood culture (routine x 2)     Status: Abnormal   Collection Time: 06/17/17  5:09 PM  Result Value Ref Range Status   Specimen Description BLOOD RIGHT ANTECUBITAL  Final   Special Requests   Final    BOTTLES DRAWN AEROBIC AND ANAEROBIC Blood Culture adequate volume   Culture  Setup Time   Final    GRAM POSITIVE COCCI IN BOTH AEROBIC AND ANAEROBIC BOTTLES CRITICAL RESULT CALLED TO, READ BACK BY AND VERIFIED WITH: K PATTON PHARMD 06/18/17 0735 JDW    Culture (A)  Final    GROUP A STREP (S.PYOGENES) ISOLATED HEALTH DEPARTMENT NOTIFIED Performed at [facility], [place] [address], [place], [place] [address]    Report Status 06/20/2017 FINAL  Final   Organism ID, Bacteria GROUP A STREP (S.PYOGENES) ISOLATED  Final      Susceptibility   Group a strep (s.pyogenes) isolated - MIC*    PENICILLIN <=0.06 SENSITIVE Sensitive     CEFTRIAXONE <=0.12 SENSITIVE Sensitive     ERYTHROMYCIN <=0.12 SENSITIVE Sensitive     LEVOFLOXACIN 0.5 SENSITIVE Sensitive     VANCOMYCIN <=0.12 SENSITIVE Sensitive     * GROUP A STREP (S.PYOGENES) ISOLATED  Blood culture (routine x  2)     Status: Abnormal   Collection Time: 06/17/17  5:26 PM  Result Value Ref Range Status   Specimen Description BLOOD LEFT ANTECUBITAL  Final   Special Requests   Final    BOTTLES DRAWN AEROBIC AND ANAEROBIC Blood Culture adequate volume   Culture  Setup Time   Final    GRAM POSITIVE COCCI IN BOTH AEROBIC AND ANAEROBIC BOTTLES CRITICAL RESULT CALLED TO, READ BACK BY AND VERIFIED WITH: K PATTON PHARMD 06/18/17 0735 JDW    Culture (A)  Final    GROUP A STREP (S.PYOGENES) ISOLATED SUSCEPTIBILITIES PERFORMED ON PREVIOUS CULTURE WITHIN THE LAST 5 DAYS. HEALTH DEPARTMENT NOTIFIED Performed at [facility], [place] [address] [place], [place] [address]    Report Status 06/20/2017 FINAL  Final  Blood Culture ID Panel (Reflexed)     Status: Abnormal   Collection Time: 06/17/17  5:26 PM  Result Value Ref Range Status   Enterococcus species NOT DETECTED NOT DETECTED Final   Listeria monocytogenes NOT DETECTED NOT DETECTED Final   Staphylococcus species NOT DETECTED NOT DETECTED Final   Staphylococcus aureus NOT DETECTED NOT DETECTED Final   Streptococcus species DETECTED (A) NOT DETECTED Final    Comment: CRITICAL RESULT CALLED TO, READ BACK BY AND VERIFIED WITH: K PATTON PHARMD 06/18/17 0735 JDW    Streptococcus agalactiae NOT DETECTED NOT DETECTED Final   Streptococcus pneumoniae NOT DETECTED NOT DETECTED Final   Streptococcus pyogenes DETECTED (A) NOT DETECTED Final    Comment: CRITICAL RESULT CALLED TO, READ BACK BY AND VERIFIED WITH: K PATTON PHARMD 06/18/17 0735 JDW    Acinetobacter baumannii NOT DETECTED NOT DETECTED Final   Enterobacteriaceae species NOT DETECTED NOT DETECTED Final   Enterobacter cloacae complex NOT DETECTED NOT DETECTED Final   Escherichia coli NOT DETECTED NOT DETECTED Final   Klebsiella oxytoca NOT DETECTED NOT DETECTED Final   Klebsiella pneumoniae NOT DETECTED NOT DETECTED Final   Proteus species NOT DETECTED NOT DETECTED Final   Serratia marcescens NOT  DETECTED NOT DETECTED Final   Haemophilus influenzae NOT DETECTED NOT DETECTED Final   Neisseria meningitidis NOT DETECTED NOT DETECTED Final   Pseudomonas aeruginosa NOT DETECTED NOT DETECTED Final   Candida albicans NOT DETECTED NOT DETECTED Final   Candida glabrata NOT DETECTED NOT DETECTED Final   Candida krusei NOT DETECTED NOT DETECTED Final   Candida parapsilosis NOT DETECTED NOT DETECTED Final   Candida tropicalis NOT DETECTED NOT DETECTED Final    Comment: Performed at [facility], [place] [address], [place], [place] [address]  Urine Culture     Status: None   Collection Time: 06/17/17 10:04 PM  Result Value Ref Range Status   Specimen Description URINE, CLEAN CATCH  Final   Special Requests NONE  Final   Culture   Final    NO GROWTH Performed at [facility], [place] [address], [place], [place] [address]    Report Status 06/19/2017 FINAL  Final  Gastrointestinal Panel by PCR , Stool     Status: None   Collection Time: 06/18/17  6:41 PM  Result Value Ref Range Status   Campylobacter species NOT DETECTED NOT DETECTED Final   Plesimonas shigelloides NOT DETECTED NOT DETECTED Final   Salmonella species NOT DETECTED NOT DETECTED Final   Yersinia enterocolitica NOT DETECTED NOT DETECTED Final   Vibrio species NOT DETECTED NOT DETECTED Final   Vibrio cholerae NOT DETECTED NOT DETECTED Final   Enteroaggregative E coli (EAEC) NOT DETECTED NOT DETECTED Final   Enteropathogenic E coli (EPEC) NOT DETECTED NOT DETECTED Final   Enterotoxigenic E coli (ETEC) NOT DETECTED NOT DETECTED Final   Shiga like toxin  producing E coli (STEC) NOT DETECTED NOT DETECTED Final   Shigella/Enteroinvasive E coli (EIEC) NOT DETECTED NOT DETECTED Final   Cryptosporidium NOT DETECTED NOT DETECTED Final   Cyclospora cayetanensis NOT DETECTED NOT DETECTED Final   Entamoeba histolytica NOT DETECTED NOT DETECTED Final   Giardia lamblia NOT DETECTED NOT DETECTED Final   Adenovirus F40/41 NOT DETECTED  NOT DETECTED Final   Astrovirus NOT DETECTED NOT DETECTED Final   Norovirus GI/GII NOT DETECTED NOT DETECTED Final   Rotavirus A NOT DETECTED NOT DETECTED Final   Sapovirus (I, II, IV, and V) NOT DETECTED NOT DETECTED Final    Comment: Performed at [facility], [place] [place], [place] [address]  MRSA PCR Screening     Status: Abnormal   Collection Time: 06/18/17  8:10 PM  Result Value Ref Range Status   MRSA by PCR POSITIVE (A) NEGATIVE Final    Comment:        The GeneXpert MRSA Assay (FDA approved for NASAL specimens only), is one component of a comprehensive MRSA colonization surveillance program. It is not intended to diagnose MRSA infection nor to guide or monitor treatment for MRSA infections. RESULT CALLED TO, READ BACK BY AND VERIFIED WITH: Z.HASSAN,RN AT 0043 06/19/17 L.PITT   Culture, blood (routine x 2)     Status: None   Collection Time: 06/21/17 11:45 AM  Result Value Ref Range Status   Specimen Description BLOOD LEFT HAND  Final   Special Requests IN PEDIATRIC BOTTLE Blood Culture adequate volume  Final   Culture   Final    NO GROWTH 5 DAYS Performed at [facility], [place] [address], [place], [place] [address]    Report Status 06/26/2017 FINAL  Final       Radiology Studies: No results found.    Scheduled Meds: . amoxicillin  1,000 mg Oral Q12H  . clarithromycin  500 mg Oral Q12H  . folic acid  1 mg Oral Daily  . lactulose  30 g Oral TID  . nadolol  20 mg Oral Daily  . pantoprazole  40 mg Oral BID  . QUEtiapine  25 mg Oral QHS  . rifaximin  550 mg Oral BID  . thiamine  100 mg Oral Daily   Continuous Infusions: . dextrose 5 % and 0.45% NaCl 50 mL/hr at 06/26/17 1107     LOS: 9 days    Time spent: 30 minutes   Dessa Phi, DO [facility] [email or web address] Password [facility] 06/26/2017, 11:25 AM

## 2017-06-27 ENCOUNTER — Encounter (HOSPITAL_COMMUNITY): Payer: Self-pay

## 2017-06-27 LAB — BASIC METABOLIC PANEL
Anion gap: 5 (ref 5–15)
BUN: 10 mg/dL (ref 6–20)
CO2: 16 mmol/L — ABNORMAL LOW (ref 22–32)
Calcium: 7.5 mg/dL — ABNORMAL LOW (ref 8.9–10.3)
Chloride: 125 mmol/L — ABNORMAL HIGH (ref 101–111)
Creatinine, Ser: 0.89 mg/dL (ref 0.61–1.24)
GFR calc Af Amer: >60 mL/min (ref >60)
GFR calc non Af Amer: >60 mL/min (ref >60)
Glucose, Bld: 97 mg/dL (ref 65–99)
Potassium: 3.1 mmol/L — ABNORMAL LOW (ref 3.5–5.1)
Sodium: 146 mmol/L — ABNORMAL HIGH (ref 135–145)

## 2017-06-27 LAB — CBC
HCT: 20.4 % — ABNORMAL LOW (ref 39.0–52.0)
Hemoglobin: 6.9 g/dL — CL (ref 13.0–17.0)
MCH: 33 pg (ref 26.0–34.0)
MCHC: 33.8 g/dL (ref 30.0–36.0)
MCV: 97.6 fL (ref 78.0–100.0)
Platelets: 99 10*3/uL — ABNORMAL LOW (ref 150–400)
RBC: 2.09 MIL/uL — ABNORMAL LOW (ref 4.22–5.81)
RDW: 19.3 % — ABNORMAL HIGH (ref 11.5–15.5)
WBC: 6.7 10*3/uL (ref 4.0–10.5)

## 2017-06-27 LAB — HEMOGLOBIN AND HEMATOCRIT, BLOOD
HCT: 25.9 % — ABNORMAL LOW (ref 39.0–52.0)
Hemoglobin: 9 g/dL — ABNORMAL LOW (ref 13.0–17.0)

## 2017-06-27 LAB — PREPARE RBC (CROSSMATCH)

## 2017-06-27 MED ORDER — PANTOPRAZOLE SODIUM 40 MG PO TBEC: 40.0000 mg | DELAYED_RELEASE_TABLET | Freq: Two times a day (BID) | ORAL | 1 refills | Status: DC

## 2017-06-27 MED ORDER — RIFAXIMIN 550 MG PO TABS: 550.0000 mg | ORAL_TABLET | Freq: Two times a day (BID) | ORAL | 0 refills | Status: AC

## 2017-06-27 MED ORDER — AMOXICILLIN 500 MG PO CAPS: 1000.0000 mg | ORAL_CAPSULE | Freq: Two times a day (BID) | ORAL | 0 refills | Status: AC

## 2017-06-27 MED ORDER — FOLIC ACID 1 MG PO TABS: 1.0000 mg | ORAL_TABLET | Freq: Every day | ORAL | 0 refills | Status: DC

## 2017-06-27 MED ORDER — SODIUM CHLORIDE 0.9 % IV SOLN
Freq: Once | INTRAVENOUS | Status: AC
  Administered 2017-06-27: 10:00:00 via INTRAVENOUS

## 2017-06-27 MED ORDER — THIAMINE HCL 100 MG PO TABS: 100.0000 mg | ORAL_TABLET | Freq: Every day | ORAL | 0 refills | Status: DC

## 2017-06-27 MED ORDER — LACTULOSE 10 GM/15ML PO SOLN: 30.0000 g | Freq: Three times a day (TID) | ORAL | 0 refills | Status: AC

## 2017-06-27 MED ORDER — CLARITHROMYCIN 500 MG PO TABS: 500.0000 mg | ORAL_TABLET | Freq: Two times a day (BID) | ORAL | 0 refills | Status: AC

## 2017-06-27 MED ORDER — POTASSIUM CHLORIDE CRYS ER 20 MEQ PO TBCR
40.0000 meq | EXTENDED_RELEASE_TABLET | Freq: Once | ORAL | Status: AC
  Administered 2017-06-27: 40 meq via ORAL
  Filled 2017-06-27: qty 2

## 2017-06-27 NOTE — Progress Notes (Addendum)
Received call from [place] with [facility]. She stated that they can't accept the referral, because they are short of PT in that area. Contacted pt's wife and she agreed to use [facility] for HHPT. Contacted Jermaine with [facility] and made him aware that wife agreed to use them for HHPT.

## 2017-06-27 NOTE — Progress Notes (Signed)
Pt HMG-6.9. MD notified. Awaiting orders. Will continue to monitor.

## 2017-06-27 NOTE — Progress Notes (Addendum)
Per RN, pt is ready to be D/C today. Met with pt, wife is not in the room. Per RN, wife is coming today after 3 p.m. Contacted wife at [phone] and discussed the name of the [facility] agency. She stated that she has friend that works for [employer] and she prefers to use them for HHPT/OT. Informed wife that [facility] provides personal care services and they don't provide PT or OT. Informed wife that I will contact [facility] to verify their services. [facility] at [phone], spoke to a male who stated that they don't provide HHPT or OT. Contacted wife again and provided the name of [name] agencies that can provide the therapy. She chose [facility]. Conctacted Jermaine at [facility] for the referral, he stated that he can accept the referral for the HHPT, but not for OT because they are short of staff for OT. Wife agreed to use [facility] if they are able to provide PT and OT. Contacted Katina at [facility] and she is verifying to see if she can accept the referral. Will continue to f/u.

## 2017-06-27 NOTE — Progress Notes (Incomplete)
Patient discharged to home. After visit Summary reviewed. Patient capable of reverbalizing medications and follow up visits. No signs and symptoms of distress noted. Patient educated to return to the ED in the case of an emergency. Spouse was also with patient. Instructions were given to both patient and spouse.

## 2017-06-27 NOTE — Discharge Summary (Signed)
Physician Discharge Summary  Mark [name] YQI:[id number] DOB: 1955/09/02 DOA: 06/17/2017  PCP: [name], MD  Admit date: 06/17/2017 Discharge date: 06/27/2017  Admitted From: Home Disposition:  Home, patient and wife declined SNF and stated family can provide 24 hour supervision at home   Recommendations for Outpatient Follow-up:  1. Follow up with PCP in 1 week 2. Please obtain BMP/CBC in 1 week  3. Follow up with GI Dr. Ardis Hughs in 2 weeks 4. Refrain from further alcohol use   Home Health: PT OT RN  Equipment/Devices: Rolling walker, 3 in 1 commode   Discharge Condition: Stable CODE STATUS: Full  Diet recommendation: Heart healthy   Brief/Interim Summary: Mark [name] is a 62 year old male with history ofadvancedalcoholic cirrhosis, ongoing alcohol abuse, history of esophageal varices, chronic anemia, hypertension, coronary artery disease with prior MI, thrombocytopenia,severe hypoalbuminemiawho presentedwith generalized weakness, nausea vomiting and right leg swelling. He was found to have severe sepsis,cellulitis of right leg,group A strep bacteremia, hypotension, acute kidney injury. He was briefly in the ICU 48 hours, did not have pressors and was transferred to hospitalist service on 2/23. He then developed active upper GI bleed and required IV PPI, 2u pRBC, and underwent EGD 2/25 which showed ulcerative duodenitis. Hospitalization is further complicated by delirium. H Pylori IgG returned positive; after discussion with GI, they recommended treating for longer course and he was started on triple therapy. Delirium continued to improve with seroquel and lactulose and rifaximin. On day of discharge, he was free of wrist restraints, alert and oriented to self, place, year. SNF was recommended but wife and patient elected to take patient home and wife assured me that her family can provide 24 hour care at home.   Discharge Diagnoses:  Principal Problem:   Sepsis  ([place]) Active Problems:   Cirrhosis, alcoholic ([name])   Anemia, iron deficiency   Dehydration   Hypotension   Acute on chronic renal failure ([name])   Hyponatremia   Thrombocytopenia ([name])   Leg edema, right   Acute metabolic encephalopathy   Septic shock ([name])   Cirrhosis ([name])   Coffee ground emesis   Acute on chronic anemia   Duodenal ulcer   Renal failure  Severe sepsis secondary to group A strep bacteremia, right lower extremity cellulitis -Has been on penicillin, completed 48 hours of clindamycin then discontinued. Dr. Broadus John discussed with Dr. Baxter Flattery of infectious disease. Plan for total 14 day course. Can deescalate to PO at discharge.  -Repeat blood culturesnegative -Amoxicillin now as patient being tx for triple-tx H Pylori   Upper GI bleed / acute blood loss anemia -Transfused 2u pRBC  -EGD 2/25 with ulcerative duodenitis, continue PPI BID at least 8 weeks and then daily thereafter, monitor Hgb, stop octreotide -H Pylori IgG positive. Spoke with GI, they recommend prolonged treatment. Will initiate triple-therapy PPI/clarithromycin/amoxicillin for 14 day course  -Hgb 6.9 this morning; no reports of acute bleeding. Will transfuse 1 more unit pRBC today prior to discharge   Acute encephalopathy -Out of window for alcohol withdrawal -Ammonia 19  -Likely medication induced, got worse after ativan per wife. Stop ativan. -Improved. He is oriented to self, place [facility] hospital), and year (2019). He is off restraints this morning and no signs of delirium.   Alcoholic cirrhosis of the liver -Patient being followed as an outpatient by Dr. Ardis Hughs, he unfortunately continues to drink about 1 - 40 ounce beer per day -Continue lactulose, nadolol, started rifaximin  -Hold spironolactone, furosemide due to renal dysfunction  -  Follow up with GI -Refrain from further alcohol use   Acute kidney injury on chronic kidney disease stage II-III -Baseline creatinine around  1.1-1.2, creatinine 6.5 on admission -Resolved. Will hold spironolactone and furosemide until outpatient follow up with GI and PCP   Thrombocytopenia -Likely in the setting of liver disease, no bleeding, monitor -Stable   Hypernatremia -Improved    Discharge Instructions  Discharge Instructions    Call MD for:   Complete by:  As directed    Confusion   Call MD for:  difficulty breathing, headache or visual disturbances   Complete by:  As directed    Call MD for:  extreme fatigue   Complete by:  As directed    Call MD for:  hives   Complete by:  As directed    Call MD for:  persistant dizziness or light-headedness   Complete by:  As directed    Call MD for:  persistant nausea and vomiting   Complete by:  As directed    Call MD for:  severe uncontrolled pain   Complete by:  As directed    Call MD for:  temperature >100.4   Complete by:  As directed    Diet - low sodium heart healthy   Complete by:  As directed    Discharge instructions   Complete by:  As directed    You were cared for by a hospitalist during your hospital stay. If you have any questions about your discharge medications or the care you received while you were in the hospital after you are discharged, you can call the unit and asked to speak with the hospitalist on call if the hospitalist that took care of you is not available. Once you are discharged, your primary care physician will handle any further medical issues. Please note that NO REFILLS for any discharge medications will be authorized once you are discharged, as it is imperative that you return to your primary care physician (or establish a relationship with a primary care physician if you do not have one) for your aftercare needs so that they can reassess your need for medications and monitor your lab values.   Discharge instructions   Complete by:  As directed    You must refrain from drinking any alcohol   Increase activity slowly   Complete by:  As  directed      Allergies as of 06/27/2017   No Known Allergies     Medication List    STOP taking these medications   furosemide 20 MG tablet Commonly known as:  LASIX   spironolactone 50 MG tablet Commonly known as:  ALDACTONE     TAKE these medications   amoxicillin 500 MG capsule Commonly known as:  AMOXIL Take 2 capsules (1,000 mg total) by mouth every 12 (twelve) hours for 11 days.   clarithromycin 500 MG tablet Commonly known as:  BIAXIN Take 1 tablet (500 mg total) by mouth every 12 (twelve) hours for 11 days.   folic acid 1 MG tablet Commonly known as:  FOLVITE Take 1 tablet (1 mg total) by mouth daily. Start taking on:  06/28/2017   lactulose 10 GM/15ML solution Commonly known as:  CHRONULAC Take 45 mLs (30 g total) by mouth 3 (three) times daily.   nadolol 20 MG tablet Commonly known as:  CORGARD Take 1 tablet (20 mg total) by mouth daily.   pantoprazole 40 MG tablet Commonly known as:  PROTONIX Take 1 tablet (40 mg total) by  mouth 2 (two) times daily.   rifaximin 550 MG Tabs tablet Commonly known as:  XIFAXAN Take 1 tablet (550 mg total) by mouth 2 (two) times daily.   thiamine 100 MG tablet Take 1 tablet (100 mg total) by mouth daily. Start taking on:  06/28/2017            Durable Medical Equipment  (From admission, onward)        Start     Ordered   06/27/17 1218  For home use only DME Walker rolling  Once    Question:  Patient needs a walker to treat with the following condition  Answer:  Weakness   06/27/17 1217   06/27/17 1218  For home use only DME 3 n 1  Once     06/27/17 1217     Follow-up Information    [name], MD. Schedule an appointment as soon as possible for a visit in 1 week(s).   Specialty:  Family Medicine Contact information: [place] [address] [phone]        Milus Banister, MD. Schedule an appointment as soon as possible for a visit in 2 week(s).   Specialty:   Gastroenterology Contact information: [address] [address] [phone]          No Known Allergies  Consultations:  PCCM  Nephrology  GI    Procedures/Studies: Ct Abdomen Pelvis Wo Contrast  Result Date: 06/17/2017 CLINICAL DATA:  Abdominal pain, nausea and vomiting, and diarrhea for 4 days. Alcoholic cirrhosis. Chronic kidney disease stage 3. EXAM: CT ABDOMEN AND PELVIS WITHOUT CONTRAST TECHNIQUE: Multidetector CT imaging of the abdomen and pelvis was performed following the standard protocol without IV contrast. COMPARISON:  Ultrasound on 01/23/2017 FINDINGS: Lower chest: No acute findings. Hepatobiliary: No mass visualized on this unenhanced exam. Hepatic cirrhosis demonstrated. Multiple tiny gallstones are seen. Gallbladder is nondilated and mild diffuse gallbladder wall thickening appears stable compared to previous ultrasound. No evidence of acute pericholecystic inflammatory changes or fluid collections. No evidence of biliary ductal dilatation. Pancreas: No mass or inflammatory process visualized on this unenhanced exam. Spleen:  Within normal limits in size. Adrenals/Urinary tract: No evidence of urolithiasis or hydronephrosis. Small fluid attenuation cyst noted in lower pole of left kidney. Unremarkable unopacified urinary bladder. Stomach/Bowel: No evidence of obstruction, inflammatory process, or abnormal fluid collections. Normal appendix visualized. Vascular/Lymphatic: No pathologically enlarged lymph nodes identified. No evidence of abdominal aortic aneurysm. Aortic atherosclerosis. Esophageal varices, consistent with portal venous hypertension. Mild mesenteric and retroperitoneal edema, without evidence of ascites, also likely secondary to cirrhosis. Reproductive:  No mass or other significant abnormality. Other:  None. Musculoskeletal:  No suspicious bone lesions identified. IMPRESSION: Hepatic cirrhosis, and findings of portal venous hypertension  including esophageal varices. Mild mesenteric and retroperitoneal edema, likely secondary to cirrhosis/portal venous hypertension. No evidence of ascites or focal inflammatory process. Cholelithiasis.  No radiographic evidence of acute cholecystitis. Electronically Signed   By: Earle Gell M.D.   On: 06/17/2017 18:06   Dg Chest 2 View  Result Date: 06/17/2017 CLINICAL DATA:  62 year old male with history of sepsis. No chest pain or shortness of breath. EXAM: CHEST  2 VIEW COMPARISON:  Chest x-ray 04/26/2013. FINDINGS: Lung volumes are low. No consolidative airspace disease. No pleural effusions. No pneumothorax. No pulmonary nodule or mass noted. Pulmonary vasculature and the cardiomediastinal silhouette are within normal limits. Old healed fracture in the lateral aspect of the left seventh rib. IMPRESSION: 1. Low  lung volumes without radiographic evidence of acute cardiopulmonary disease. Electronically Signed   By: Vinnie Langton M.D.   On: 06/17/2017 21:04   US Renal  Result Date: 06/17/2017 CLINICAL DATA:  Renal failure. EXAM: RENAL / URINARY TRACT ULTRASOUND COMPLETE COMPARISON:  Noncontrast CT earlier this day. Abdominal ultrasound 01/23/2017 FINDINGS: Right Kidney: Length: 10.5 cm. Echogenicity within normal limits. No mass or hydronephrosis visualized. Left Kidney: Length: 11.0 cm. Echogenicity within normal limits. Simple cyst in the lower pole measures 2.7 cm. No solid mass or hydronephrosis visualized. Bladder: Appears normal for degree of bladder distention. Incidental note of distended gallbladder containing stones and sludge. IMPRESSION: No obstructive uropathy. Left renal cyst, otherwise unremarkable sonographic appearance of both kidneys. Electronically Signed   By: Jeb Levering M.D.   On: 06/17/2017 22:56    EGD 2/25 Impression:       - LA Grade A reflux esophagitis.                           - Small hiatal hernia.                           - Portal hypertensive gastropathy, mild  and                            non-bleeding.                           - Acute ulcerative duodenitis (source of recent                            hematemesis and blood loss).                           - Normal second portion of the duodenum.                           - No specimens collected.   Discharge Exam: Vitals:   06/27/17 1022 06/27/17 1330  BP: 136/87 129/71  Pulse: 76 77  Resp: 14 16  Temp: 97.7 F (36.5 C) (!) 97.5 F (36.4 C)  SpO2: 100% 100%    General: Pt is alert, awake, not in acute distress Cardiovascular: RRR, S1/S2 +, no rubs, no gallops Respiratory: CTA bilaterally, no wheezing, no rhonchi Abdominal: Soft, NT, ND, bowel sounds + Extremities: no edema, no cyanosis Skin: right lower leg edema and erythema resolved Neuro: alert, awake, oriented to self, place, year     The results of significant diagnostics from this hospitalization (including imaging, microbiology, ancillary and laboratory) are listed below for reference.     Microbiology: Recent Results (from the past 240 hour(s))  Blood culture (routine x 2)     Status: Abnormal   Collection Time: 06/17/17  5:26 PM  Result Value Ref Range Status   Specimen Description BLOOD LEFT ANTECUBITAL  Final   Special Requests   Final    BOTTLES DRAWN AEROBIC AND ANAEROBIC Blood Culture adequate volume   Culture  Setup Time   Final    GRAM POSITIVE COCCI IN BOTH AEROBIC AND ANAEROBIC BOTTLES CRITICAL RESULT CALLED TO, READ BACK BY AND VERIFIED WITH: K PATTON PHARMD 06/18/17 0735 JDW    Culture (A)  Final  GROUP A STREP (S.PYOGENES) ISOLATED SUSCEPTIBILITIES PERFORMED ON PREVIOUS CULTURE WITHIN THE LAST 5 DAYS. HEALTH DEPARTMENT NOTIFIED Performed at [facility], [place] [address], [place], [place] [address]    Report Status 06/20/2017 FINAL  Final  Blood Culture ID Panel (Reflexed)     Status: Abnormal   Collection Time: 06/17/17  5:26 PM  Result Value Ref Range Status   Enterococcus species NOT  DETECTED NOT DETECTED Final   Listeria monocytogenes NOT DETECTED NOT DETECTED Final   Staphylococcus species NOT DETECTED NOT DETECTED Final   Staphylococcus aureus NOT DETECTED NOT DETECTED Final   Streptococcus species DETECTED (A) NOT DETECTED Final    Comment: CRITICAL RESULT CALLED TO, READ BACK BY AND VERIFIED WITH: K PATTON PHARMD 06/18/17 0735 JDW    Streptococcus agalactiae NOT DETECTED NOT DETECTED Final   Streptococcus pneumoniae NOT DETECTED NOT DETECTED Final   Streptococcus pyogenes DETECTED (A) NOT DETECTED Final    Comment: CRITICAL RESULT CALLED TO, READ BACK BY AND VERIFIED WITH: K PATTON PHARMD 06/18/17 0735 JDW    Acinetobacter baumannii NOT DETECTED NOT DETECTED Final   Enterobacteriaceae species NOT DETECTED NOT DETECTED Final   Enterobacter cloacae complex NOT DETECTED NOT DETECTED Final   Escherichia coli NOT DETECTED NOT DETECTED Final   Klebsiella oxytoca NOT DETECTED NOT DETECTED Final   Klebsiella pneumoniae NOT DETECTED NOT DETECTED Final   Proteus species NOT DETECTED NOT DETECTED Final   Serratia marcescens NOT DETECTED NOT DETECTED Final   Haemophilus influenzae NOT DETECTED NOT DETECTED Final   Neisseria meningitidis NOT DETECTED NOT DETECTED Final   Pseudomonas aeruginosa NOT DETECTED NOT DETECTED Final   Candida albicans NOT DETECTED NOT DETECTED Final   Candida glabrata NOT DETECTED NOT DETECTED Final   Candida krusei NOT DETECTED NOT DETECTED Final   Candida parapsilosis NOT DETECTED NOT DETECTED Final   Candida tropicalis NOT DETECTED NOT DETECTED Final    Comment: Performed at [facility], [place] [address], [place], [place] [address]  Urine Culture     Status: None   Collection Time: 06/17/17 10:04 PM  Result Value Ref Range Status   Specimen Description URINE, CLEAN CATCH  Final   Special Requests NONE  Final   Culture   Final    NO GROWTH Performed at [facility], [place] [address], [place],  [address]    Report Status  06/19/2017 FINAL  Final  Gastrointestinal Panel by PCR , Stool     Status: None   Collection Time: 06/18/17  6:41 PM  Result Value Ref Range Status   Campylobacter species NOT DETECTED NOT DETECTED Final   Plesimonas shigelloides NOT DETECTED NOT DETECTED Final   Salmonella species NOT DETECTED NOT DETECTED Final   Yersinia enterocolitica NOT DETECTED NOT DETECTED Final   Vibrio species NOT DETECTED NOT DETECTED Final   Vibrio cholerae NOT DETECTED NOT DETECTED Final   Enteroaggregative E coli (EAEC) NOT DETECTED NOT DETECTED Final   Enteropathogenic E coli (EPEC) NOT DETECTED NOT DETECTED Final   Enterotoxigenic E coli (ETEC) NOT DETECTED NOT DETECTED Final   Shiga like toxin producing E coli (STEC) NOT DETECTED NOT DETECTED Final   Shigella/Enteroinvasive E coli (EIEC) NOT DETECTED NOT DETECTED Final   Cryptosporidium NOT DETECTED NOT DETECTED Final   Cyclospora cayetanensis NOT DETECTED NOT DETECTED Final   Entamoeba histolytica NOT DETECTED NOT DETECTED Final   Giardia lamblia NOT DETECTED NOT DETECTED Final   Adenovirus F40/41 NOT DETECTED NOT DETECTED Final  Astrovirus NOT DETECTED NOT DETECTED Final   Norovirus GI/GII NOT DETECTED NOT DETECTED Final   Rotavirus A NOT DETECTED NOT DETECTED Final   Sapovirus (I, II, IV, and V) NOT DETECTED NOT DETECTED Final    Comment: Performed at [facility], [place] [place], [place] [address]  MRSA PCR Screening     Status: Abnormal   Collection Time: 06/18/17  8:10 PM  Result Value Ref Range Status   MRSA by PCR POSITIVE (A) NEGATIVE Final    Comment:        The GeneXpert MRSA Assay (FDA approved for NASAL specimens only), is one component of a comprehensive MRSA colonization surveillance program. It is not intended to diagnose MRSA infection nor to guide or monitor treatment for MRSA infections. RESULT CALLED TO, READ BACK BY AND VERIFIED WITH: Z.HASSAN,RN AT 0043 06/19/17 L.PITT   Culture, blood (routine x 2)      Status: None   Collection Time: 06/21/17 11:45 AM  Result Value Ref Range Status   Specimen Description BLOOD LEFT HAND  Final   Special Requests IN PEDIATRIC BOTTLE Blood Culture adequate volume  Final   Culture   Final    NO GROWTH 5 DAYS Performed at [facility], [place] [address], [place], [place] [address]    Report Status 06/26/2017 FINAL  Final     Labs: BNP (last 3 results) No results for input(s): BNP in the last 8760 hours. Basic Metabolic Panel: Recent Labs  Lab 06/22/17 0715 06/23/17 1013 06/25/17 0629 06/26/17 0518 06/27/17 0525  NA 141 144 151* 152* 146*  K 4.2 4.1 3.9 3.5 3.1*  CL 118* 121* 127* 128* 125*  CO2 17* 17* 16* 16* 16*  GLUCOSE 96 88 79 100* 97  BUN 52* 37* 20 15 10   CREATININE 1.48* 1.29* 1.17 1.08 0.89  CALCIUM 7.3* 7.5* 7.9* 7.8* 7.5*   Liver Function Tests: Recent Labs  Lab 06/21/17 0724  AST 83*  ALT 24  ALKPHOS 136*  BILITOT 1.7*  PROT 4.8*  ALBUMIN 1.4*   No results for input(s): LIPASE, AMYLASE in the last 168 hours. Recent Labs  Lab 06/23/17 1023  AMMONIA 19   CBC: Recent Labs  Lab 06/22/17 0715 06/23/17 1013 06/25/17 0629 06/26/17 0518 06/27/17 0525 06/27/17 1549  WBC 14.7* 10.0 8.3 7.3 6.7  --   NEUTROABS 11.5*  --   --   --   --   --   HGB 8.8* 8.3* 7.4* 7.2* 6.9* 9.0*  HCT 24.4* 23.9* 21.7* 21.7* 20.4* 25.9*  MCV 86.8 89.5 94.3 96.4 97.6  --   PLT 47* 68* 77* 90* 99*  --    Cardiac Enzymes: No results for input(s): CKTOTAL, CKMB, CKMBINDEX, TROPONINI in the last 168 hours. BNP: Invalid input(s): POCBNP CBG: Recent Labs  Lab 06/20/17 2113  GLUCAP 123*   D-Dimer No results for input(s): DDIMER in the last 72 hours. Hgb A1c No results for input(s): HGBA1C in the last 72 hours. Lipid Profile No results for input(s): CHOL, HDL, LDLCALC, TRIG, CHOLHDL, LDLDIRECT in the last 72 hours. Thyroid function studies No results for input(s): TSH, T4TOTAL, T3FREE, THYROIDAB in the last 72 hours.  Invalid  input(s): FREET3 Anemia work up No results for input(s): VITAMINB12, FOLATE, FERRITIN, TIBC, IRON, RETICCTPCT in the last 72 hours. Urinalysis    Component Value Date/Time   COLORURINE AMBER (A) 06/17/2017 1649   APPEARANCEUR HAZY (A) 06/17/2017 1649   LABSPEC 1.013 06/17/2017 1649   PHURINE  5.0 06/17/2017 1649   GLUCOSEU NEGATIVE 06/17/2017 1649   HGBUR SMALL (A) 06/17/2017 1649   BILIRUBINUR NEGATIVE 06/17/2017 1649   KETONESUR NEGATIVE 06/17/2017 1649   PROTEINUR NEGATIVE 06/17/2017 1649   UROBILINOGEN 2.0 (H) 04/26/2013 0945   NITRITE NEGATIVE 06/17/2017 1649   LEUKOCYTESUR TRACE (A) 06/17/2017 1649   Sepsis Labs Invalid input(s): PROCALCITONIN,  WBC,  LACTICIDVEN Microbiology Recent Results (from the past 240 hour(s))  Blood culture (routine x 2)     Status: Abnormal   Collection Time: 06/17/17  5:26 PM  Result Value Ref Range Status   Specimen Description BLOOD LEFT ANTECUBITAL  Final   Special Requests   Final    BOTTLES DRAWN AEROBIC AND ANAEROBIC Blood Culture adequate volume   Culture  Setup Time   Final    GRAM POSITIVE COCCI IN BOTH AEROBIC AND ANAEROBIC BOTTLES CRITICAL RESULT CALLED TO, READ BACK BY AND VERIFIED WITH: K PATTON PHARMD 06/18/17 0735 JDW    Culture (A)  Final    GROUP A STREP (S.PYOGENES) ISOLATED SUSCEPTIBILITIES PERFORMED ON PREVIOUS CULTURE WITHIN THE LAST 5 DAYS. HEALTH DEPARTMENT NOTIFIED Performed at [facility], [place] [address] [place], [place] [address]    Report Status 06/20/2017 FINAL  Final  Blood Culture ID Panel (Reflexed)     Status: Abnormal   Collection Time: 06/17/17  5:26 PM  Result Value Ref Range Status   Enterococcus species NOT DETECTED NOT DETECTED Final   Listeria monocytogenes NOT DETECTED NOT DETECTED Final   Staphylococcus species NOT DETECTED NOT DETECTED Final   Staphylococcus aureus NOT DETECTED NOT DETECTED Final   Streptococcus species DETECTED (A) NOT DETECTED Final    Comment: CRITICAL RESULT CALLED  TO, READ BACK BY AND VERIFIED WITH: K PATTON PHARMD 06/18/17 0735 JDW    Streptococcus agalactiae NOT DETECTED NOT DETECTED Final   Streptococcus pneumoniae NOT DETECTED NOT DETECTED Final   Streptococcus pyogenes DETECTED (A) NOT DETECTED Final    Comment: CRITICAL RESULT CALLED TO, READ BACK BY AND VERIFIED WITH: K PATTON PHARMD 06/18/17 0735 JDW    Acinetobacter baumannii NOT DETECTED NOT DETECTED Final   Enterobacteriaceae species NOT DETECTED NOT DETECTED Final   Enterobacter cloacae complex NOT DETECTED NOT DETECTED Final   Escherichia coli NOT DETECTED NOT DETECTED Final   Klebsiella oxytoca NOT DETECTED NOT DETECTED Final   Klebsiella pneumoniae NOT DETECTED NOT DETECTED Final   Proteus species NOT DETECTED NOT DETECTED Final   Serratia marcescens NOT DETECTED NOT DETECTED Final   Haemophilus influenzae NOT DETECTED NOT DETECTED Final   Neisseria meningitidis NOT DETECTED NOT DETECTED Final   Pseudomonas aeruginosa NOT DETECTED NOT DETECTED Final   Candida albicans NOT DETECTED NOT DETECTED Final   Candida glabrata NOT DETECTED NOT DETECTED Final   Candida krusei NOT DETECTED NOT DETECTED Final   Candida parapsilosis NOT DETECTED NOT DETECTED Final   Candida tropicalis NOT DETECTED NOT DETECTED Final    Comment: Performed at [facility], [place] [address] [place], [place] [address]  Urine Culture     Status: None   Collection Time: 06/17/17 10:04 PM  Result Value Ref Range Status   Specimen Description URINE, CLEAN CATCH  Final   Special Requests NONE  Final   Culture   Final    NO GROWTH Performed at [facility], [place] [address], [place], [place] [address]    Report Status 06/19/2017 FINAL  Final  Gastrointestinal Panel by PCR , Stool     Status:  None   Collection Time: 06/18/17  6:41 PM  Result Value Ref Range Status   Campylobacter species NOT DETECTED NOT DETECTED Final   Plesimonas shigelloides NOT DETECTED NOT DETECTED Final   Salmonella species NOT  DETECTED NOT DETECTED Final   Yersinia enterocolitica NOT DETECTED NOT DETECTED Final   Vibrio species NOT DETECTED NOT DETECTED Final   Vibrio cholerae NOT DETECTED NOT DETECTED Final   Enteroaggregative E coli (EAEC) NOT DETECTED NOT DETECTED Final   Enteropathogenic E coli (EPEC) NOT DETECTED NOT DETECTED Final   Enterotoxigenic E coli (ETEC) NOT DETECTED NOT DETECTED Final   Shiga like toxin producing E coli (STEC) NOT DETECTED NOT DETECTED Final   Shigella/Enteroinvasive E coli (EIEC) NOT DETECTED NOT DETECTED Final   Cryptosporidium NOT DETECTED NOT DETECTED Final   Cyclospora cayetanensis NOT DETECTED NOT DETECTED Final   Entamoeba histolytica NOT DETECTED NOT DETECTED Final   Giardia lamblia NOT DETECTED NOT DETECTED Final   Adenovirus F40/41 NOT DETECTED NOT DETECTED Final   Astrovirus NOT DETECTED NOT DETECTED Final   Norovirus GI/GII NOT DETECTED NOT DETECTED Final   Rotavirus A NOT DETECTED NOT DETECTED Final   Sapovirus (I, II, IV, and V) NOT DETECTED NOT DETECTED Final    Comment: Performed at [facility] [address] [place], [place] [address]  MRSA PCR Screening     Status: Abnormal   Collection Time: 06/18/17  8:10 PM  Result Value Ref Range Status   MRSA by PCR POSITIVE (A) NEGATIVE Final    Comment:        The GeneXpert MRSA Assay (FDA approved for NASAL specimens only), is one component of a comprehensive MRSA colonization surveillance program. It is not intended to diagnose MRSA infection nor to guide or monitor treatment for MRSA infections. RESULT CALLED TO, READ BACK BY AND VERIFIED WITH: Z.HASSAN,RN AT 0043 06/19/17 L.PITT   Culture, blood (routine x 2)     Status: None   Collection Time: 06/21/17 11:45 AM  Result Value Ref Range Status   Specimen Description BLOOD LEFT HAND  Final   Special Requests IN PEDIATRIC BOTTLE Blood Culture adequate volume  Final   Culture   Final    NO GROWTH 5 DAYS Performed at [facility], [place] [address], [place],  [address]    Report Status 06/26/2017 FINAL  Final     Patient was seen and examined on the day of discharge and was found to be in stable condition. Time coordinating discharge: 35 minutes including assessment and coordination of care, as well as examination of the patient.   SIGNED:  Dessa Phi, DO [facility] Pager [phone]  If 7PM-7AM, please contact night-coverage [email or web address] Password [email or web address] 06/27/2017, 5:10 PM

## 2017-06-28 LAB — TYPE AND SCREEN
ABO/RH(D): B POS
Antibody Screen: NEGATIVE
Unit division: 0

## 2017-06-28 LAB — BPAM RBC
Blood Product Expiration Date: 201903302359
ISSUE DATE / TIME: 201903020953
Unit Type and Rh: 7300

## 2017-06-29 ENCOUNTER — Telehealth: Payer: Self-pay | Admitting: *Deleted

## 2017-06-29 NOTE — Telephone Encounter (Signed)
Transition Care Management Follow-up Telephone Call  Per Discharge Summary:  Admit date: 06/17/2017 Discharge date: 06/27/2017  Admitted From: Home Disposition:  Home, patient and wife declined SNF and stated family can provide 24 hour supervision at home   Recommendations for Outpatient Follow-up:  1. Follow up with PCP in 1 week 2. Please obtain BMP/CBC in 1 week  3. Follow up with GI Dr. Ardis Hughs in 2 weeks 4. Refrain from further alcohol use   Home Health: PT OT RN  Equipment/Devices: Rolling walker, 3 in 1 commode   Discharge Condition: Stable CODE STATUS: Full  Diet recommendation: Heart healthy   --   How have you been since you were released from the hospital? "He's been doing good, pretty good."   Do you understand why you were in the hospital? yes   Do you understand the discharge instructions? yes   Where were you discharged to? Home   Items Reviewed:  Medications reviewed: yes  Allergies reviewed: yes  Dietary changes reviewed: yes  Referrals reviewed: yes    Functional Questionnaire:   Activities of Daily Living (ADLs):   He states they are independent in the following: feeding, continence, grooming, toileting and dressing States they require assistance with the following: ambulation and bathing and hygiene. Wife is assisting with ADLs and patient is walking with a walker.   Any transportation issues/concerns?: no, daughter in law will drive him to appt   Any patient concerns? no   Confirmed importance and date/time of follow-up visits scheduled yes  Provider Appointment booked with Dr. Grier Mitts, 07/01/17 @ 9:00am  Confirmed with patient if condition begins to worsen call PCP or go to the ER.  Patient was given the office number and encouraged to call back with question or concerns.  : yes

## 2017-07-01 ENCOUNTER — Encounter: Payer: Self-pay | Admitting: Family Medicine

## 2017-07-01 ENCOUNTER — Ambulatory Visit: Payer: BC Managed Care – PPO | Admitting: Family Medicine

## 2017-07-01 VITALS — BP 138/60 | HR 57 | Temp 98.0°F

## 2017-07-01 DIAGNOSIS — L03115 Cellulitis of right lower limb: Secondary | ICD-10-CM | POA: Diagnosis not present

## 2017-07-01 DIAGNOSIS — K269 Duodenal ulcer, unspecified as acute or chronic, without hemorrhage or perforation: Secondary | ICD-10-CM

## 2017-07-01 DIAGNOSIS — Z09 Encounter for follow-up examination after completed treatment for conditions other than malignant neoplasm: Secondary | ICD-10-CM | POA: Diagnosis not present

## 2017-07-01 DIAGNOSIS — N179 Acute kidney failure, unspecified: Secondary | ICD-10-CM | POA: Diagnosis not present

## 2017-07-01 DIAGNOSIS — K7030 Alcoholic cirrhosis of liver without ascites: Secondary | ICD-10-CM | POA: Diagnosis not present

## 2017-07-01 DIAGNOSIS — N189 Chronic kidney disease, unspecified: Secondary | ICD-10-CM | POA: Diagnosis not present

## 2017-07-01 LAB — CBC WITH DIFFERENTIAL/PLATELET
Basophils Absolute: 0.1 10*3/uL (ref 0.0–0.1)
Basophils Relative: 2.2 % (ref 0.0–3.0)
Eosinophils Absolute: 0.1 10*3/uL (ref 0.0–0.7)
Eosinophils Relative: 2.1 % (ref 0.0–5.0)
HCT: 25.5 % — ABNORMAL LOW (ref 39.0–52.0)
Hemoglobin: 8.7 g/dL — ABNORMAL LOW (ref 13.0–17.0)
Lymphocytes Relative: 14.6 % (ref 12.0–46.0)
Lymphs Abs: 0.7 10*3/uL (ref 0.7–4.0)
MCHC: 34.2 g/dL (ref 30.0–36.0)
MCV: 98.4 fl (ref 78.0–100.0)
Monocytes Absolute: 0.7 10*3/uL (ref 0.1–1.0)
Monocytes Relative: 13.6 % — ABNORMAL HIGH (ref 3.0–12.0)
Neutro Abs: 3.4 10*3/uL (ref 1.4–7.7)
Neutrophils Relative %: 67.5 % (ref 43.0–77.0)
Platelets: 171 10*3/uL (ref 150.0–400.0)
RBC: 2.59 Mil/uL — ABNORMAL LOW (ref 4.22–5.81)
RDW: 21.3 % — ABNORMAL HIGH (ref 11.5–15.5)
WBC: 5.1 10*3/uL (ref 4.0–10.5)

## 2017-07-01 LAB — COMPREHENSIVE METABOLIC PANEL
ALT: 23 U/L (ref 0–53)
AST: 38 U/L — ABNORMAL HIGH (ref 0–37)
Albumin: 1.8 g/dL — ABNORMAL LOW (ref 3.5–5.2)
Alkaline Phosphatase: 106 U/L (ref 39–117)
BUN: 10 mg/dL (ref 6–23)
CO2: 21 mEq/L (ref 19–32)
Calcium: 7.3 mg/dL — ABNORMAL LOW (ref 8.4–10.5)
Chloride: 110 mEq/L (ref 96–112)
Creatinine, Ser: 0.96 mg/dL (ref 0.40–1.50)
GFR: 101.46 mL/min (ref >60.00)
Glucose, Bld: 99 mg/dL (ref 70–99)
Potassium: 3.9 mEq/L (ref 3.5–5.1)
Sodium: 135 mEq/L (ref 135–145)
Total Bilirubin: 1.6 mg/dL — ABNORMAL HIGH (ref 0.2–1.2)
Total Protein: 6.8 g/dL (ref 6.0–8.3)

## 2017-07-01 NOTE — Progress Notes (Signed)
Subjective:    Patient ID: Mark [name], male    DOB: 06/10/1955, 62 y.o.   MRN: [id number]  Chief Complaint  Patient presents with  . Hospitalization Follow-up    Staph infection R leg open sore on foot    HPI Patient was seen today for HFU.  Pt was admitted on 2/20-06/27/17 for weakness, N/V, and R leg swelling.  Pt was found to have severe sepsis, cellulitis of R lef, group A strep bacteremia, hypotension, and AKI.  Pt spent 48 hrs in ICU then transferred to hospitalist service on 2/23.  He developed upper GIB, requiring IV PPI, 2 u pRBCs.  EGD on 2/25 revealed ulcerative duodenitis and was H. Pylori IgG positive.  Pt transfused another 1 u pRBCs prior to d/c.  Triple therapy started with PPI, clarithromycin, and amoxicillin.  Since discharge from hospital pt endorses feeling weak and being in pain.  Pt has been unable to take pain medications secondary to his ongoing health problems including alcoholic cirrhosis and acute on chronic kidney disease.  Per pt's wife his appetite is normal and he is no longer drinking EtOH or smoking cigarettes.  [name] wife states she was unable to get all of the medications prescribed at discharge as they were unavailable at the pharmacy.  [name] wife called the pharmacy today and the rest of the medications are available including clarithromycin 500 mg, Protonix 40 mg, and rifaximin 550 mg.  Per wife she tried to contact home health but was told there were no orders placed at time of discharge.  Edema in pt's RLE is improving, but the skin is peeling.    Pt has an upcoming appointment with GI next week. Past Medical History:  Diagnosis Date  . Alcohol abuse   . Anemia   . Arthritis   . Ascites   . Cirrhosis ([place])   . Heart murmur   . Hyperlipidemia   . Hypertension   . Leg swelling   . Myocardial infarction ([place]) 2012  . Stroke [facility]) 2012   no deficits  . Thrombocytopenia ([name])     No Known Allergies  ROS General: Denies fever, chills, night  sweats, changes in weight, changes in appetite + fatigue HEENT: Denies headaches, ear pain, changes in vision, rhinorrhea, sore throat CV: Denies CP, palpitations, SOB, orthopnea Pulm: Denies SOB, cough, wheezing GI: Denies abdominal pain, nausea, vomiting, diarrhea, constipation GU: Denies dysuria, hematuria, frequency, vaginal discharge Msk: Denies muscle cramps, joint pains  +RLE pain and edema Neuro: Denies weakness, numbness, tingling Skin: Denies rashes, bruising  +wound RLE Psych: Denies depression, anxiety, hallucinations     Objective:    Blood pressure 138/60, pulse (!) 57, temperature 98 F (36.7 C), temperature source Oral, SpO2 100 %.   Gen. Pleasant, well-nourished, in no distress, normal affect   HEENT: [place]/AT, face symmetric, no scleral icterus, PERRLA, nares patent without drainage, pharynx without erythema or exudate. Lungs: no accessory muscle use, CTAB, no wheezes or rales Cardiovascular: RRR, no m/r/g, RLE with trace edema in foot. Abdomen: BS present, soft, NT/ND Neuro:  A&Ox3, CN II-XII intact, normal gait Skin:  Warm, dry.  RLE peeling skin healing wound on foot, slightly edematous, no erythema or increased warmth to area, no induration   Wt Readings from Last 3 Encounters:  06/26/17 158 lb 15.2 oz (72.1 kg)  02/02/17 146 lb 8 oz (66.5 kg)  01/19/17 150 lb 6.4 oz (68.2 kg)    Lab Results  Component Value Date   WBC 6.7  06/27/2017   HGB 9.0 (L) 06/27/2017   HCT 25.9 (L) 06/27/2017   PLT 99 (L) 06/27/2017   GLUCOSE 97 06/27/2017   CHOL 84 12/01/2013   TRIG 58 12/01/2013   HDL 26 (L) 12/01/2013   LDLCALC 46 12/01/2013   ALT 24 06/21/2017   AST 83 (H) 06/21/2017   NA 146 (H) 06/27/2017   K 3.1 (L) 06/27/2017   CL 125 (H) 06/27/2017   CREATININE 0.89 06/27/2017   BUN 10 06/27/2017   CO2 16 (L) 06/27/2017   TSH 1.980 06/18/2017   INR 1.38 06/17/2017   HGBA1C  11/01/2009    5.1 (NOTE)                                                                        According to the ADA Clinical Practice Recommendations for 2011, when HbA1c is used as a screening test:   >=6.5%   Diagnostic of Diabetes Mellitus           (if abnormal result  is confirmed)  5.7-6.4%   Increased risk of developing Diabetes Mellitus  References:Diagnosis and Classification of Diabetes Mellitus,Diabetes LNLG,9211,94(RDEYC 1):S62-S69 and Standards of Medical Care in         Diabetes - 2011,Diabetes Care,2011,34  (Suppl 1):S11-S61.    Assessment/Plan:  Hospital discharge follow-up -Transition care management follow-up phone call made on 06/29/2017 -We will continue to hold previous blood pressure medications.  Cellulitis of right lower extremity  -Patient advised to complete antibiotic course -Patient's wife contacted pharmacy, medications that were missing including Biaxin, Protonix, rifaximin mean are now available for pickup. - Plan: CBC with Differential/Platelet, Ambulatory referral to [facility], CBC with Differential/Platelet  Acute kidney injury superimposed on chronic kidney disease ([place])  -Patient encouraged to increase p.o. intake of water - Plan: Comprehensive metabolic panel  Alcoholic cirrhosis of liver without ascites ([place])  -Patient advised to refrain from alcohol -Patient advised to keep follow-up appointment with GI in the next week -Continue Nadolol 20 mg, rifaximin 550 mg, lactulose, thiamine, folic acid - Plan: Comprehensive metabolic panel -Given RTC or ED precautions including bleeding, fever, chills, nausea, vomiting, wound infection, etc.  Duodenal ulcer -H. pylori IgG positive -Continue triple therapy including Protonix 40 mg twice daily, clarithromycin 500 mg, amoxicillin 500 mg -Patient encouraged to avoid NSAIDs -Patient encouraged to follow-up with GI in the next week  Follow-up in 1 month, sooner if needed.  [facility] referral placed this visit for wound care and physical therapy.   Grier Mitts, MD

## 2017-07-01 NOTE — Patient Instructions (Addendum)
Cirrhosis Cirrhosis is long-term (chronic) liver injury. The liver is your largest internal organ, and it performs many functions. The liver converts food into energy, removes toxic material from your blood, makes important proteins, and absorbs necessary vitamins from your diet. If you have cirrhosis, it means many of your healthy liver cells have been replaced by scar tissue. This prevents blood from flowing through your liver, which makes it difficult for your liver to function. This scarring is not reversible, but treatment can prevent it from getting worse. What are the causes? Hepatitis C and long-term alcohol abuse are the most common causes of cirrhosis. Other causes include:  Nonalcoholic fatty liver disease.  Hepatitis B infection.  Autoimmune hepatitis.  Diseases that cause blockage of ducts inside the liver.  Inherited liver diseases.  Reactions to certain long-term medicines.  Parasitic infections.  Long-term exposure to certain toxins.  What increases the risk? You may have a higher risk of cirrhosis if you:  Have certain hepatitis viruses.  Abuse alcohol, especially if you are male.  Are overweight.  Share needles.  Have unprotected sex with someone who has hepatitis.  What are the signs or symptoms? You may not have any signs and symptoms at first. Symptoms may not develop until the damage to your liver starts to get worse. Signs and symptoms of cirrhosis may include:  Tenderness in the right-upper part of your abdomen.  Weakness and tiredness (fatigue).  Loss of appetite.  Nausea.  Weight loss and muscle loss.  Itchiness.  Yellow skin and eyes (jaundice).  Buildup of fluid in the abdomen (ascites).  Swelling of the feet and ankles (edema).  Appearance of tiny blood vessels under the skin.  Mental confusion.  Easy bruising and bleeding.  How is this diagnosed? Your health care provider may suspect cirrhosis based on your symptoms and  medical history, especially if you have other medical conditions or a history of alcohol abuse. Your health care provider will do a physical exam to feel your liver and check for signs of cirrhosis. Your health care provider may perform other tests, including:  Blood tests to check: ? Whether you have hepatitis B or C. ? Kidney function. ? Liver function.  Imaging tests such as: ? MRI or CT scan to look for changes seen in advanced cirrhosis. ? Ultrasound to see if normal liver tissue is being replaced by scar tissue.  A procedure using a long needle to take a sample of liver tissue (biopsy) for examination under a microscope. Liver biopsy can confirm the diagnosis of cirrhosis.  How is this treated? Treatment depends on how damaged your liver is and what caused the damage. Treatment may include treating cirrhosis symptoms or treating the underlying causes of the condition to try to slow the progression of the damage. Treatment may include:  Making lifestyle changes, such as: ? Eating a healthy diet. ? Restricting salt intake. ? Maintaining a healthy weight. ? Not abusing drugs or alcohol.  Taking medicines to: ? Treat liver infections or other infections. ? Control itching. ? Reduce fluid buildup. ? Reduce certain blood toxins. ? Reduce risk of bleeding from enlarged blood vessels in the stomach or esophagus (varices).  If varices are causing bleeding problems, you may need treatment with a procedure that ties up the vessels causing them to fall off (band ligation).  If cirrhosis is causing your liver to fail, your health care provider may recommend a liver transplant.  Other treatments may be recommended depending on any complications   of cirrhosis, such as liver-related kidney failure (hepatorenal syndrome).  Follow these instructions at home:  Take medicines only as directed by your health care provider. Do not use drugs that are toxic to your liver. Ask your health care  provider before taking any new medicines, including over-the-counter medicines.  Rest as needed.  Eat a well-balanced diet. Ask your health care provider or dietitian for more information.  You may have to follow a low-salt diet or restrict your water intake as directed.  Do not drink alcohol. This is especially important if you are taking acetaminophen.  Keep all follow-up visits as directed by your health care provider. This is important. Contact a health care provider if:  You have fatigue or weakness that is getting worse.  You develop swelling of the hands, feet, legs, or face.  You have a fever.  You develop loss of appetite.  You have nausea or vomiting.  You develop jaundice.  You develop easy bruising or bleeding. Get help right away if:  You vomit bright red blood or a material that looks like coffee grounds.  You have blood in your stools.  Your stools appear black and tarry.  You become confused.  You have chest pain or trouble breathing. This information is not intended to replace advice given to you by your health care provider. Make sure you discuss any questions you have with your health care provider. Document Released: 04/14/2005 Document Revised: 08/23/2015 Document Reviewed: 12/21/2013 Elsevier Interactive Patient Education  2018 [place], Adult Taking care of your wound properly can help to prevent pain and infection. It can also help your wound to heal more quickly. How is this treated? Wound care  Follow instructions from your health care provider about how to take care of your wound. Make sure you: ? Wash your hands with soap and water before you change the bandage (dressing). If soap and water are not available, use hand sanitizer. ? Change your dressing as told by your health care provider. ? Leave stitches (sutures), skin glue, or adhesive strips in place. These skin closures may need to stay in place for 2 weeks or longer.  If adhesive strip edges start to loosen and curl up, you may trim the loose edges. Do not remove adhesive strips completely unless your health care provider tells you to do that.  Check your wound area every day for signs of infection. Check for: ? More redness, swelling, or pain. ? More fluid or blood. ? Warmth. ? Pus or a bad smell.  Ask your health care provider if you should clean the wound with mild soap and water. Doing this may include: ? Using a clean towel to pat the wound dry after cleaning it. Do not rub or scrub the wound. ? Applying a cream or ointment. Do this only as told by your health care provider. ? Covering the incision with a clean dressing.  Ask your health care provider when you can leave the wound uncovered. Medicines   If you were prescribed an antibiotic medicine, cream, or ointment, take or use the antibiotic as told by your health care provider. Do not stop taking or using the antibiotic even if your condition improves.  Take over-the-counter and prescription medicines only as told by your health care provider. If you were prescribed pain medicine, take it at least 30 minutes before doing any wound care or as told by your health care provider. General instructions  Return to your normal activities  as told by your health care provider. Ask your health care provider what activities are safe.  Do not scratch or pick at the wound.  Keep all follow-up visits as told by your health care provider. This is important.  Eat a diet that includes protein, vitamin A, vitamin C, and other nutrient-rich foods. These help the wound heal: ? Protein-rich foods include meat, dairy, beans, nuts, and other sources. ? Vitamin A-rich foods include carrots and dark green, leafy vegetables. ? Vitamin C-rich foods include citrus, tomatoes, and other fruits and vegetables. ? Nutrient-rich foods have protein, carbohydrates, fat, vitamins, or minerals. Eat a variety of healthy foods  including vegetables, fruits, and whole grains. Contact a health care provider if:  You received a tetanus shot and you have swelling, severe pain, redness, or bleeding at the injection site.  Your pain is not controlled with medicine.  You have more redness, swelling, or pain around the wound.  You have more fluid or blood coming from the wound.  Your wound feels warm to the touch.  You have pus or a bad smell coming from the wound.  You have a fever or chills.  You are nauseous or you vomit.  You are dizzy. Get help right away if:  You have a red streak going away from your wound.  The edges of the wound open up and separate.  Your wound is bleeding and the bleeding does not stop with gentle pressure.  You have a rash.  You faint.  You have trouble breathing. This information is not intended to replace advice given to you by your health care provider. Make sure you discuss any questions you have with your health care provider. Document Released: 01/22/2008 Document Revised: 12/12/2015 Document Reviewed: 10/30/2015 Elsevier Interactive Patient Education  2017 [employer].

## 2017-07-06 ENCOUNTER — Telehealth: Payer: Self-pay | Admitting: Family Medicine

## 2017-07-06 NOTE — Telephone Encounter (Signed)
Verbal orders given to Dan Europe PT with [facility] per [name].

## 2017-07-06 NOTE — Telephone Encounter (Signed)
Ok

## 2017-07-06 NOTE — Telephone Encounter (Signed)
Copied from [place]. Topic: Quick Communication - See Telephone Encounter >> Jul 06, 2017 11:54 AM Ether Griffins B wrote: CRM for notification. See Telephone encounter for:  Nicolette with [facility] calling needing verbal orders for home health skilled nursing for disease management and education. 1 x 1 week 1x 2 weeks and 2x 1 week and 3 PRN visits. As well she will be doing a PT evaluation. Call back number [phone]. 07/06/17.

## 2017-07-06 NOTE — Telephone Encounter (Signed)
Dan Europe PT with [facility] calling for verbal orders for 2x 4 weeks effective 07/05/17 for gate training, exercise and fall prevention. Contact # [phone] ok to LVM.

## 2017-07-06 NOTE — Telephone Encounter (Signed)
Ok to give verbal orders?

## 2017-07-08 ENCOUNTER — Ambulatory Visit: Payer: Self-pay | Admitting: Physician Assistant

## 2017-07-09 ENCOUNTER — Encounter: Payer: Self-pay | Admitting: Physician Assistant

## 2017-07-09 ENCOUNTER — Other Ambulatory Visit (INDEPENDENT_AMBULATORY_CARE_PROVIDER_SITE_OTHER): Payer: BC Managed Care – PPO

## 2017-07-09 ENCOUNTER — Ambulatory Visit (INDEPENDENT_AMBULATORY_CARE_PROVIDER_SITE_OTHER): Payer: BC Managed Care – PPO | Admitting: Physician Assistant

## 2017-07-09 VITALS — BP 124/54 | HR 84 | Ht 66.0 in

## 2017-07-09 DIAGNOSIS — Z8719 Personal history of other diseases of the digestive system: Secondary | ICD-10-CM

## 2017-07-09 DIAGNOSIS — Z8619 Personal history of other infectious and parasitic diseases: Secondary | ICD-10-CM

## 2017-07-09 DIAGNOSIS — K7030 Alcoholic cirrhosis of liver without ascites: Secondary | ICD-10-CM | POA: Diagnosis not present

## 2017-07-09 LAB — CBC WITH DIFFERENTIAL/PLATELET
Basophils Absolute: 0 K/uL (ref 0.0–0.1)
Basophils Relative: 1.1 % (ref 0.0–3.0)
Eosinophils Absolute: 0.1 K/uL (ref 0.0–0.7)
Eosinophils Relative: 4.1 % (ref 0.0–5.0)
HCT: 22 % — CL (ref 39.0–52.0)
Hemoglobin: 7.5 g/dL — CL (ref 13.0–17.0)
Lymphocytes Relative: 14.4 % (ref 12.0–46.0)
Lymphs Abs: 0.4 K/uL — ABNORMAL LOW (ref 0.7–4.0)
MCHC: 34.3 g/dL (ref 30.0–36.0)
MCV: 98.6 fl (ref 78.0–100.0)
Monocytes Absolute: 0.5 K/uL (ref 0.1–1.0)
Monocytes Relative: 15.4 % — ABNORMAL HIGH (ref 3.0–12.0)
Neutro Abs: 1.9 K/uL (ref 1.4–7.7)
Neutrophils Relative %: 65 % (ref 43.0–77.0)
Platelets: 73 K/uL — ABNORMAL LOW (ref 150.0–400.0)
RBC: 2.23 Mil/uL — ABNORMAL LOW (ref 4.22–5.81)
RDW: 19.1 % — ABNORMAL HIGH (ref 11.5–15.5)
WBC: 2.9 K/uL — ABNORMAL LOW (ref 4.0–10.5)

## 2017-07-09 NOTE — Patient Instructions (Signed)
Continue current medications.  Your physician has requested that you go to the basement for the following lab work before leaving today: CBC

## 2017-07-09 NOTE — Progress Notes (Signed)
Chief Complaint: Follow-up after recent hospitalization and upper GI bleed   HPI:     [name] is a 62 year old African-American male with a past medical history of alcoholic cirrhosis, who follows with Dr. Ardis Hughs and presents to clinic today for follow-up after recent hospitalization and upper GI bleed.     Per review of chart our service was consulted 06/21/17 for coffee-ground emesis, hematemesis and history of esophageal varices.  Hemoglobin noted at 6.6 at that time.  EGD 06/22/17 Dr. Hilarie Fredrickson with LA grade a esophagitis, mild portal hypertensive gastropathy, and diffuse severe inflammation characterized by adherent blood, erosions, erythema and shallow ulcerations of the duodenal bulb.  Patient found to be H. pylori positive and started on triple therapy.  While in hospital patient continued on lactulose 3 times daily and rifaximin was added for multifactorial encephalopathy including hepatic encephalopathy.  Continued on Protonix twice daily and nadolol 20 mg daily.    Recent CBC 07/01/17.  Hemoglobin improved to 8.7 from 6.912 days ago.  CMP with AST elevated at 38, total bilirubin 1.6 and albumin 1.8.    Today, presents to clinic accompanied by his wife who does take care of him.  They tell me that he is doing well after recent hospitalization, no further hematemesis, his right leg is healing from cellulitis.  Patient does complain today of back pain but tells me he thinks this is because he has been in the bed and the wheelchair too much.  Tells me he got up and "walked and walked and walked" the other day and had no back pain afterwards.  His wife denies any complaints today.    Denies fever, chills, melena, blood in his stool, change in bowel habits, weight loss, anorexia, nausea or vomiting.  Past Medical History:  Diagnosis Date  . Alcohol abuse   . Anemia   . Arthritis   . Ascites   . Cirrhosis ([place])   . Heart murmur   . Hyperlipidemia   . Hypertension   . Leg swelling   .  Myocardial infarction ([place]) 2012  . Stroke [facility]) 2012   no deficits  . Thrombocytopenia ([place])     Past Surgical History:  Procedure Laterality Date  . COLONOSCOPY WITH PROPOFOL N/A 02/07/2016   Procedure: COLONOSCOPY WITH PROPOFOL;  Surgeon: Milus Banister, MD;  Location: [facility] ENDOSCOPY;  Service: Endoscopy;  Laterality: N/A;  . ESOPHAGOGASTRODUODENOSCOPY (EGD) WITH PROPOFOL N/A 02/07/2016   Procedure: ESOPHAGOGASTRODUODENOSCOPY (EGD) WITH PROPOFOL;  Surgeon: Milus Banister, MD;  Location: [facility] ENDOSCOPY;  Service: Endoscopy;  Laterality: N/A;  . ESOPHAGOGASTRODUODENOSCOPY (EGD) WITH PROPOFOL N/A 06/22/2017   Procedure: ESOPHAGOGASTRODUODENOSCOPY (EGD) WITH PROPOFOL;  Surgeon: Jerene Bears, MD;  Location: [facility] ENDOSCOPY;  Service: Gastroenterology;  Laterality: N/A;  . HERNIA REPAIR Right    inguinal  . KNEE ARTHROSCOPY     bilateral/  12/14  . TOTAL KNEE ARTHROPLASTY Right 05/02/2013   Procedure: RIGHT TOTAL KNEE ARTHROPLASTY;  Surgeon: Mauri Pole, MD;  Location: [facility] ORS;  Service: Orthopedics;  Laterality: Right;    Current Outpatient Medications  Medication Sig Dispense Refill  . folic acid (FOLVITE) 1 MG tablet Take 1 tablet (1 mg total) by mouth daily. 30 tablet 0  . lactulose (CHRONULAC) 10 GM/15ML solution Take 45 mLs (30 g total) by mouth 3 (three) times daily. 4050 mL 0  . nadolol (CORGARD) 20 MG tablet Take 1 tablet (20 mg total) by mouth daily. 30 tablet 6  . pantoprazole (PROTONIX) 40 MG tablet Take  1 tablet (40 mg total) by mouth 2 (two) times daily. 60 tablet 1  . RA VITAMIN B-1 100 MG TABS   0  . rifaximin (XIFAXAN) 550 MG TABS tablet Take 1 tablet (550 mg total) by mouth 2 (two) times daily. 60 tablet 0  . thiamine 100 MG tablet Take 1 tablet (100 mg total) by mouth daily. 30 tablet 0   No current facility-administered medications for this visit.     Allergies as of 07/09/2017  . (No Known Allergies)    Family History  Problem Relation Age of Onset  .  Hypertension Father   . Diabetes Father   . Dementia Mother   . Lupus Sister     Social History   Socioeconomic History  . Marital status: Married    Spouse name: Not on file  . Number of children: 4  . Years of education: Not on file  . Highest education level: Not on file  Social Needs  . Financial resource strain: Not on file  . Food insecurity - worry: Not on file  . Food insecurity - inability: Not on file  . Transportation needs - medical: Not on file  . Transportation needs - non-medical: Not on file  Occupational History  . Occupation: Maintenance    Employer: [employer]  Tobacco Use  . Smoking status: Former Smoker    Packs/day: 0.50    Years: 10.00    Pack years: 5.00    Types: Cigarettes    Last attempt to quit: 01/05/2016    Years since quitting: 1.5  . Smokeless tobacco: Never Used  . Tobacco comment: 4 cigarettes a day  Substance and Sexual Activity  . Alcohol use: No    Comment: 40 oz per week as of 01-19-17  . Drug use: Yes    Types: Marijuana, Heroin    Comment: took about 1 month ago- as of 01-19-17  . Sexual activity: Not on file    Comment: now and then smokies marijuana  Other Topics Concern  . Not on file  Social History Narrative  . Not on file    Review of Systems:    Constitutional: No weight loss, fever or chills Skin:+ healing cellulitus right leg Cardiovascular: No chest pain Respiratory: No SOB  Gastrointestinal: See HPI and otherwise negative   Physical Exam:  Vital signs: BP (!) 124/54 (BP Location: Left Arm, Patient Position: Sitting, Cuff Size: Normal)   Pulse 84   Ht 5\' 6"  (1.676 m)   BMI 25.66 kg/m    Constitutional:   AA male appears to be in NAD, Well developed, Well nourished, alert and cooperative Respiratory: Respirations even and unlabored. Lungs clear to auscultation bilaterally.   No wheezes, crackles, or rhonchi.  Cardiovascular: Normal S1, S2. No MRG. Regular rate and rhythm. No peripheral edema, cyanosis  or pallor.  Gastrointestinal:  Soft, nondistended, nontender. No rebound or guarding. Normal bowel sounds. No appreciable masses or hepatomegaly. Rectal:  Not performed.  Psychiatric: Demonstrates good judgement and reason without abnormal affect or behaviors.  RELEVANT LABS AND IMAGING: CBC    Component Value Date/Time   WBC 5.1 07/01/2017 0957   RBC 2.59 (L) 07/01/2017 0957   HGB 8.7 Repeated and verified X2. (L) 07/01/2017 0957   HCT 25.5 (L) 07/01/2017 0957   PLT 171.0 07/01/2017 0957   MCV 98.4 07/01/2017 0957   MCH 33.0 06/27/2017 0525   MCHC 34.2 07/01/2017 0957   RDW 21.3 (H) 07/01/2017 0957  LYMPHSABS 0.7 07/01/2017 0957   MONOABS 0.7 07/01/2017 0957   EOSABS 0.1 07/01/2017 0957   BASOSABS 0.1 07/01/2017 0957    CMP     Component Value Date/Time   NA 135 07/01/2017 0957   K 3.9 07/01/2017 0957   CL 110 07/01/2017 0957   CO2 21 07/01/2017 0957   GLUCOSE 99 07/01/2017 0957   BUN 10 07/01/2017 0957   CREATININE 0.96 07/01/2017 0957   CREATININE 0.74 12/05/2013 1320   CALCIUM 7.3 (L) 07/01/2017 0957   PROT 6.8 07/01/2017 0957   ALBUMIN 1.8 (L) 07/01/2017 0957   AST 38 (H) 07/01/2017 0957   ALT 23 07/01/2017 0957   ALKPHOS 106 07/01/2017 0957   BILITOT 1.6 (H) 07/01/2017 0957   GFRNONAA >60 06/27/2017 0525   GFRAA >60 06/27/2017 0525    Assessment: 1.  Upper GI bleed: Hospitalization 06/21/17 severe inflammation erythema and blood clots in the duodenum H. pylori positive, also portal hypertensive gastropathy 2.  Alcoholic cirrhosis: Stable, patient currently not drinking  Plan: 1.  Ordered repeat CBC. 2.  Patient to continue current home medications including Lactulose 3 times daily, Rifaximin 550 mg twice daily, Nadolol 20 mg daily. 3.  Also continue Pantoprazole 40 mg twice daily and finish triple therapy for H. pylori.  Patient will need to be checked for eradication 4-6 weeks after finishing. 4.  Patient to follow-up with Dr. Ardis Hughs in the next 2-3 months  or sooner if necessary.  Ellouise Newer, PA-C [facility] 07/09/2017, 3:02 PM  Cc: [name], MD

## 2017-07-10 ENCOUNTER — Encounter (HOSPITAL_COMMUNITY): Payer: Self-pay

## 2017-07-10 ENCOUNTER — Other Ambulatory Visit: Payer: Self-pay

## 2017-07-10 ENCOUNTER — Emergency Department (HOSPITAL_COMMUNITY): Payer: BC Managed Care – PPO

## 2017-07-10 ENCOUNTER — Inpatient Hospital Stay (HOSPITAL_COMMUNITY)
Admission: EM | Admit: 2017-07-10 | Discharge: 2017-07-14 | DRG: 872 | Disposition: A | Discharge status: Home Health | Payer: BC Managed Care – PPO | Attending: Nephrology | Admitting: Nephrology

## 2017-07-10 DIAGNOSIS — D509 Iron deficiency anemia, unspecified: Secondary | ICD-10-CM

## 2017-07-10 DIAGNOSIS — Z87891 Personal history of nicotine dependence: Secondary | ICD-10-CM

## 2017-07-10 DIAGNOSIS — R6520 Severe sepsis without septic shock: Secondary | ICD-10-CM | POA: Diagnosis present

## 2017-07-10 DIAGNOSIS — D899 Disorder involving the immune mechanism, unspecified: Secondary | ICD-10-CM | POA: Diagnosis present

## 2017-07-10 DIAGNOSIS — I8510 Secondary esophageal varices without bleeding: Secondary | ICD-10-CM | POA: Diagnosis present

## 2017-07-10 DIAGNOSIS — I471 Supraventricular tachycardia: Secondary | ICD-10-CM | POA: Diagnosis present

## 2017-07-10 DIAGNOSIS — R509 Fever, unspecified: Secondary | ICD-10-CM | POA: Diagnosis not present

## 2017-07-10 DIAGNOSIS — N189 Chronic kidney disease, unspecified: Secondary | ICD-10-CM | POA: Diagnosis present

## 2017-07-10 DIAGNOSIS — E785 Hyperlipidemia, unspecified: Secondary | ICD-10-CM | POA: Diagnosis present

## 2017-07-10 DIAGNOSIS — J101 Influenza due to other identified influenza virus with other respiratory manifestations: Secondary | ICD-10-CM | POA: Diagnosis present

## 2017-07-10 DIAGNOSIS — A4189 Other specified sepsis: Principal | ICD-10-CM | POA: Diagnosis present

## 2017-07-10 DIAGNOSIS — D61818 Other pancytopenia: Secondary | ICD-10-CM | POA: Diagnosis present

## 2017-07-10 DIAGNOSIS — R6510 Systemic inflammatory response syndrome (SIRS) of non-infectious origin without acute organ dysfunction: Secondary | ICD-10-CM | POA: Diagnosis present

## 2017-07-10 DIAGNOSIS — Z96651 Presence of right artificial knee joint: Secondary | ICD-10-CM | POA: Diagnosis present

## 2017-07-10 DIAGNOSIS — D696 Thrombocytopenia, unspecified: Secondary | ICD-10-CM | POA: Diagnosis present

## 2017-07-10 DIAGNOSIS — I252 Old myocardial infarction: Secondary | ICD-10-CM

## 2017-07-10 DIAGNOSIS — I2510 Atherosclerotic heart disease of native coronary artery without angina pectoris: Secondary | ICD-10-CM | POA: Diagnosis present

## 2017-07-10 DIAGNOSIS — Z8619 Personal history of other infectious and parasitic diseases: Secondary | ICD-10-CM

## 2017-07-10 DIAGNOSIS — K7030 Alcoholic cirrhosis of liver without ascites: Secondary | ICD-10-CM | POA: Diagnosis present

## 2017-07-10 DIAGNOSIS — E871 Hypo-osmolality and hyponatremia: Secondary | ICD-10-CM | POA: Diagnosis present

## 2017-07-10 DIAGNOSIS — I129 Hypertensive chronic kidney disease with stage 1 through stage 4 chronic kidney disease, or unspecified chronic kidney disease: Secondary | ICD-10-CM | POA: Diagnosis present

## 2017-07-10 DIAGNOSIS — F1010 Alcohol abuse, uncomplicated: Secondary | ICD-10-CM | POA: Diagnosis present

## 2017-07-10 DIAGNOSIS — D649 Anemia, unspecified: Secondary | ICD-10-CM | POA: Diagnosis present

## 2017-07-10 DIAGNOSIS — N179 Acute kidney failure, unspecified: Secondary | ICD-10-CM | POA: Diagnosis present

## 2017-07-10 DIAGNOSIS — Z8673 Personal history of transient ischemic attack (TIA), and cerebral infarction without residual deficits: Secondary | ICD-10-CM

## 2017-07-10 LAB — I-STAT CG4 LACTIC ACID, ED
Lactic Acid, Venous: 1.53 mmol/L (ref 0.5–1.9)
Lactic Acid, Venous: 1.17 mmol/L (ref 0.5–1.9)

## 2017-07-10 LAB — CBC WITH DIFFERENTIAL/PLATELET
Basophils Absolute: 0 10*3/uL (ref 0.0–0.1)
Basophils Relative: 0 %
Eosinophils Absolute: 0 10*3/uL (ref 0.0–0.7)
Eosinophils Relative: 0 %
HCT: 23.4 % — ABNORMAL LOW (ref 39.0–52.0)
Hemoglobin: 7.9 g/dL — ABNORMAL LOW (ref 13.0–17.0)
Lymphocytes Relative: 20 %
Lymphs Abs: 0.5 10*3/uL — ABNORMAL LOW (ref 0.7–4.0)
MCH: 32.9 pg (ref 26.0–34.0)
MCHC: 33.8 g/dL (ref 30.0–36.0)
MCV: 97.5 fL (ref 78.0–100.0)
Monocytes Absolute: 0.2 10*3/uL (ref 0.1–1.0)
Monocytes Relative: 10 %
Neutro Abs: 1.6 10*3/uL — ABNORMAL LOW (ref 1.7–7.7)
Neutrophils Relative %: 70 %
Platelets: 69 10*3/uL — ABNORMAL LOW (ref 150–400)
RBC: 2.4 MIL/uL — ABNORMAL LOW (ref 4.22–5.81)
RDW: 17.9 % — ABNORMAL HIGH (ref 11.5–15.5)
WBC: 2.3 10*3/uL — ABNORMAL LOW (ref 4.0–10.5)

## 2017-07-10 LAB — URINALYSIS, ROUTINE W REFLEX MICROSCOPIC
Bilirubin Urine: NEGATIVE
Glucose, UA: NEGATIVE mg/dL
Hgb urine dipstick: NEGATIVE
Ketones, ur: NEGATIVE mg/dL
Leukocytes, UA: NEGATIVE
Nitrite: NEGATIVE
Protein, ur: NEGATIVE mg/dL
Specific Gravity, Urine: 1.019 (ref 1.005–1.030)
pH: 5 (ref 5.0–8.0)

## 2017-07-10 LAB — COMPREHENSIVE METABOLIC PANEL
ALT: 17 U/L (ref 17–63)
AST: 33 U/L (ref 15–41)
Albumin: 1.7 g/dL — ABNORMAL LOW (ref 3.5–5.0)
Alkaline Phosphatase: 111 U/L (ref 38–126)
Anion gap: 5 (ref 5–15)
BUN: 11 mg/dL (ref 6–20)
CO2: 18 mmol/L — ABNORMAL LOW (ref 22–32)
Calcium: 7.8 mg/dL — ABNORMAL LOW (ref 8.9–10.3)
Chloride: 108 mmol/L (ref 101–111)
Creatinine, Ser: 1.33 mg/dL — ABNORMAL HIGH (ref 0.61–1.24)
GFR calc Af Amer: >60 mL/min (ref >60)
GFR calc non Af Amer: 55 mL/min — ABNORMAL LOW (ref >60)
Glucose, Bld: 102 mg/dL — ABNORMAL HIGH (ref 65–99)
Potassium: 4.4 mmol/L (ref 3.5–5.1)
Sodium: 131 mmol/L — ABNORMAL LOW (ref 135–145)
Total Bilirubin: 0.9 mg/dL (ref 0.3–1.2)
Total Protein: 7.6 g/dL (ref 6.5–8.1)

## 2017-07-10 LAB — PROTIME-INR
INR: 1.4
Prothrombin Time: 17.1 seconds — ABNORMAL HIGH (ref 11.4–15.2)

## 2017-07-10 MED ORDER — VANCOMYCIN HCL IN DEXTROSE 1-5 GM/200ML-% IV SOLN
1000.0000 mg | Freq: Once | INTRAVENOUS | Status: AC
  Administered 2017-07-10: 1000 mg via INTRAVENOUS
  Filled 2017-07-10: qty 200

## 2017-07-10 MED ORDER — PIPERACILLIN-TAZOBACTAM 3.375 G IVPB 30 MIN
3.3750 g | Freq: Once | INTRAVENOUS | Status: AC
  Administered 2017-07-10: 3.375 g via INTRAVENOUS
  Filled 2017-07-10: qty 50

## 2017-07-10 MED ORDER — ACETAMINOPHEN 325 MG PO TABS
650.0000 mg | ORAL_TABLET | Freq: Once | ORAL | Status: AC
  Administered 2017-07-10: 650 mg via ORAL
  Filled 2017-07-10: qty 2

## 2017-07-10 MED ORDER — VANCOMYCIN HCL 500 MG IV SOLR
500.0000 mg | Freq: Two times a day (BID) | INTRAVENOUS | Status: DC
  Administered 2017-07-11 – 2017-07-13 (×5): 500 mg via INTRAVENOUS
  Filled 2017-07-10 (×8): qty 500

## 2017-07-10 MED ORDER — PIPERACILLIN-TAZOBACTAM 3.375 G IVPB
3.3750 g | Freq: Three times a day (TID) | INTRAVENOUS | Status: DC
  Administered 2017-07-11 – 2017-07-13 (×8): 3.375 g via INTRAVENOUS
  Filled 2017-07-10 (×11): qty 50

## 2017-07-10 NOTE — Progress Notes (Signed)
I agree with the above note, plan 

## 2017-07-10 NOTE — ED Triage Notes (Signed)
Pt brought in by [facility] from home for weakness. Pt states "My stomach feels tight", denies pain. Pt has hx of cirrhosis. Pt denies NVD, SOB. Pt currently being tx for sepsis with amoxicillin. Pt recently d/c'd from hospital, admitted for infection in foot and pneumonia. Pt A+Ox4 during triage and denies having any complaints.

## 2017-07-10 NOTE — ED Provider Notes (Signed)
[facility] EMERGENCY DEPARTMENT Provider Note   CSN: [id number] Arrival date & time: 07/10/17  1957     History   Chief Complaint Chief Complaint  Patient presents with  . Weakness    HPI Mark [name] is a 62 y.o. male.  HPI 5 caveat acuity of situation patient complains of generalized weakness onset today.  He admits to slight cough.  He denies shortness of breath.  He also stated that his abdomen felt "tight" earlier today however no longer feels tight.  He is presently hungry.  No other associated symptoms.  No treatment prior to coming here.  By EMS. Past Medical History:  Diagnosis Date  . Alcohol abuse   . Anemia   . Arthritis   . Ascites   . Cirrhosis ([place])   . Heart murmur   . Hyperlipidemia   . Hypertension   . Leg swelling   . Myocardial infarction ([place]) 2012  . Stroke [facility]) 2012   no deficits  . Thrombocytopenia [facility])     Patient Active Problem List   Diagnosis Date Noted  . Duodenal ulcer   . Renal failure   . Cirrhosis ([place])   . Coffee ground emesis   . Acute on chronic anemia   . Septic shock ([place]) 06/18/2017  . Dehydration 06/17/2017  . Hypotension 06/17/2017  . Acute on chronic renal failure ([place]) 06/17/2017  . Hyponatremia 06/17/2017  . Thrombocytopenia ([place]) 06/17/2017  . Sepsis ([place]) 06/17/2017  . Leg edema, right 06/17/2017  . Acute metabolic encephalopathy 40/98/1191  . Anemia, iron deficiency   . Benign neoplasm of ascending colon   . Hemorrhoids   . Portal hypertensive gastropathy ([place])   . Gastritis and gastroduodenitis   . Esophageal varices without bleeding ([place])   . H/O: CVA (cerebrovascular accident) 12/01/2013  . ACS (acute coronary syndrome) ([place]) 12/01/2013  . Anasarca 12/01/2013  . Substance abuse ([place]) 12/01/2013  . CKD (chronic kidney disease) stage 3, GFR 30-59 ml/min ([name]) 12/01/2013  . S/P right TKA 05/02/2013  . Preop cardiovascular exam 04/14/2013  . Murmur 04/14/2013  . Right  inguinal hernia 12/26/2010  . Cirrhosis, alcoholic ([employer]) 47/82/9562    Past Surgical History:  Procedure Laterality Date  . COLONOSCOPY WITH PROPOFOL N/A 02/07/2016   Procedure: COLONOSCOPY WITH PROPOFOL;  Surgeon: Milus Banister, MD;  [facility] END[facility];  Service: Endoscopy;  Laterality: N/A;  . ESOPHAGOGASTRODUODENOSCOPY (EGD) WITH PROPOFOL N/A 02/07/2016   Procedure: ESOPHAGOGASTRODUODENOSCOPY (EGD) WITH PROPOFOL;  Surgeon: Milus Banister, MD;  [facility]OS[facility];  Service: Endoscopy;  Laterality: N/A;  . ESOPHAGOGASTRODUODENOSCOPY (EGD) WITH PROPOFOL N/A 06/22/2017   Procedure: ESOPHAGOGASTRODUODENOSCOPY (EGD) WITH PROPOFOL;  Surgeon: Jerene Bears, MD;  Location: [facility] ENDOSCOPY;  Service: Gastroenterology;  Laterality: N/A;  . HERNIA REPAIR Right    inguinal  . KNEE ARTHROSCOPY     bilateral/  12/14  . TOTAL KNEE ARTHROPLASTY Right 05/02/2013   Procedure: RIGHT TOTAL KNEE ARTHROPLASTY;  Surgeon: Mauri Pole, MD;  Location: [facility] ORS;  Service: Orthopedics;  Laterality: Right;       Home Medications    Prior to Admission medications   Medication Sig Start Date End Date Taking? Authorizing Provider  clarithromycin (BIAXIN) 500 MG tablet Take 500 mg by mouth every 12 (twelve) hours.    [provider]  folic acid (FOLVITE) 1 MG tablet Take 1 tablet (1 mg total) by mouth daily. 06/28/17   Dessa Phi, DO  lactulose (CHRONULAC) 10 GM/15ML solution Take 45  mLs (30 g total) by mouth 3 (three) times daily. 06/27/17 07/27/17  Dessa Phi, DO  nadolol (CORGARD) 20 MG tablet Take 1 tablet (20 mg total) by mouth daily. 01/19/17   Levin Erp, PA  pantoprazole (PROTONIX) 40 MG tablet Take 1 tablet (40 mg total) by mouth 2 (two) times daily. 06/27/17 08/26/17  Dessa Phi, DO  RA VITAMIN B-1 100 MG TABS  06/30/17   [provider]  rifaximin (XIFAXAN) 550 MG TABS tablet Take 1 tablet (550 mg total) by mouth 2 (two) times daily. 06/27/17 07/27/17  Dessa Phi, DO    thiamine 100 MG tablet Take 1 tablet (100 mg total) by mouth daily. 06/28/17   Dessa Phi, DO    Family History Family History  Problem Relation Age of Onset  . Hypertension Father   . Diabetes Father   . Dementia Mother   . Lupus Sister     Social History Social History   Tobacco Use  . Smoking status: Former Smoker    Packs/day: 0.50    Years: 10.00    Pack years: 5.00    Types: Cigarettes    Last attempt to quit: 01/05/2016    Years since quitting: 1.5  . Smokeless tobacco: Never Used  . Tobacco comment: 4 cigarettes a day  Substance Use Topics  . Alcohol use: No    Comment: 40 oz per week as of 01-19-17  . Drug use: Yes    Types: Marijuana, Heroin    Comment: took about 1 month ago- as of 01-19-17    Snorts heroine last time approximately 2 weeks ago.  No history of IV drug use Allergies   Patient has no known allergies.   Review of Systems Review of Systems  Unable to perform ROS: Acuity of condition  HENT: Negative.   Respiratory: Positive for cough.   Cardiovascular: Negative.   Gastrointestinal: Negative.   Musculoskeletal: Negative.   Skin: Positive for wound.       Wound on right ankle for several days  Allergic/Immunologic: Positive for immunocompromised state.       Alcoholic  Neurological: Positive for weakness.       Generalized weakness  Psychiatric/Behavioral: Negative.      Physical Exam Updated Vital Signs BP (!) 151/72 (BP Location: Right Arm)   Pulse (!) 101   Temp (!) 102.2 F (39 C) (Rectal)   Resp 18   Ht 5\' 6"  (1.676 m)   Wt 67.1 kg (148 lb)   SpO2 99%   BMI 23.89 kg/m   Physical Exam  Constitutional: He is oriented to person, place, and time.  Chronically ill-appearing  HENT:  Head: Normocephalic and atraumatic.  Eyes: Conjunctivae are normal. Pupils are equal, round, and reactive to light.  Neck: Neck supple. No tracheal deviation present. No thyromegaly present.  Cardiovascular: Normal rate and regular rhythm.  No  murmur heard. Pulmonary/Chest: Effort normal.  Rales at right base  Abdominal: Soft. Bowel sounds are normal. He exhibits distension.  Genitourinary: Penis normal.  Musculoskeletal: Normal range of motion. He exhibits no edema or tenderness.  Neurological: He is alert and oriented to person, place, and time. Coordination normal.  Skin: Skin is warm and dry. No rash noted.  Scabbed lesion approximately 2 cm diameter over right ankle.  Nontender.  No surrounding redness swelling or tenderness.  Skin surrounding the area is hyperpigmented.  Skin hyperpigmented at right lower extremity at distal third of leg.  Otherwise without rash  Psychiatric: He has a  normal mood and affect.  Nursing note and vitals reviewed.    ED Treatments / Results  Labs (all labs ordered are listed, but only abnormal results are displayed) Labs Reviewed  CULTURE, BLOOD (ROUTINE X 2)  CULTURE, BLOOD (ROUTINE X 2)  COMPREHENSIVE METABOLIC PANEL  CBC WITH DIFFERENTIAL/PLATELET  PROTIME-INR  URINALYSIS, ROUTINE W REFLEX MICROSCOPIC  I-STAT CG4 LACTIC ACID, ED  I-STAT CG4 LACTIC ACID, ED    EKG  EKG Interpretation  Date/Time:  Friday July 10 2017 20:07:32 EDT Ventricular Rate:  82 PR Interval:    QRS Duration: 76 QT Interval:  387 QTC Calculation: 452 R Axis:   29 Text Interpretation:  Sinus rhythm Probable anteroseptal infarct, old No significant change since last tracing Confirmed by Orlie Dakin [phone]) on 07/10/2017 8:45:55 PM      Chest x-ray reviewed by me Results for orders placed or performed during the hospital encounter of 07/10/17  Comprehensive metabolic panel  Result Value Ref Range   Sodium 131 (L) 135 - 145 mmol/L   Potassium 4.4 3.5 - 5.1 mmol/L   Chloride 108 101 - 111 mmol/L   CO2 18 (L) 22 - 32 mmol/L   Glucose, Bld 102 (H) 65 - 99 mg/dL   BUN 11 6 - 20 mg/dL   Creatinine, Ser 1.33 (H) 0.61 - 1.24 mg/dL   Calcium 7.8 (L) 8.9 - 10.3 mg/dL   Total Protein 7.6 6.5 - 8.1 g/dL    Albumin 1.7 (L) 3.5 - 5.0 g/dL   AST 33 15 - 41 U/L   ALT 17 17 - 63 U/L   Alkaline Phosphatase 111 38 - 126 U/L   Total Bilirubin 0.9 0.3 - 1.2 mg/dL   GFR calc non Af Amer 55 (L) >60 mL/min   GFR calc Af Amer >60 >60 mL/min   Anion gap 5 5 - 15  CBC with Differential  Result Value Ref Range   WBC 2.3 (L) 4.0 - 10.5 K/uL   RBC 2.40 (L) 4.22 - 5.81 MIL/uL   Hemoglobin 7.9 (L) 13.0 - 17.0 g/dL   HCT 23.4 (L) 39.0 - 52.0 %   MCV 97.5 78.0 - 100.0 fL   MCH 32.9 26.0 - 34.0 pg   MCHC 33.8 30.0 - 36.0 g/dL   RDW 17.9 (H) 11.5 - 15.5 %   Platelets 69 (L) 150 - 400 K/uL   Neutrophils Relative % 70 %   Neutro Abs 1.6 (L) 1.7 - 7.7 K/uL   Lymphocytes Relative 20 %   Lymphs Abs 0.5 (L) 0.7 - 4.0 K/uL   Monocytes Relative 10 %   Monocytes Absolute 0.2 0.1 - 1.0 K/uL   Eosinophils Relative 0 %   Eosinophils Absolute 0.0 0.0 - 0.7 K/uL   Basophils Relative 0 %   Basophils Absolute 0.0 0.0 - 0.1 K/uL  Protime-INR  Result Value Ref Range   Prothrombin Time 17.1 (H) 11.4 - 15.2 seconds   INR 1.40   Urinalysis, Routine w reflex microscopic  Result Value Ref Range   Color, Urine AMBER (A) YELLOW   APPearance CLEAR CLEAR   Specific Gravity, Urine 1.019 1.005 - 1.030   pH 5.0 5.0 - 8.0   Glucose, UA NEGATIVE NEGATIVE mg/dL   Hgb urine dipstick NEGATIVE NEGATIVE   Bilirubin Urine NEGATIVE NEGATIVE   Ketones, ur NEGATIVE NEGATIVE mg/dL   Protein, ur NEGATIVE NEGATIVE mg/dL   Nitrite NEGATIVE NEGATIVE   Leukocytes, UA NEGATIVE NEGATIVE  I-Stat CG4 Lactic Acid, ED  Result Value Ref  Range   Lactic Acid, Venous 1.53 0.5 - 1.9 mmol/L  I-Stat CG4 Lactic Acid, ED  (not at  [facility])  Result Value Ref Range   Lactic Acid, Venous 1.17 0.5 - 1.9 mmol/L   Ct Abdomen Pelvis Wo Contrast  Result Date: 06/17/2017 CLINICAL DATA:  Abdominal pain, nausea and vomiting, and diarrhea for 4 days. Alcoholic cirrhosis. Chronic kidney disease stage 3. EXAM: CT ABDOMEN AND PELVIS WITHOUT CONTRAST TECHNIQUE:  Multidetector CT imaging of the abdomen and pelvis was performed following the standard protocol without IV contrast. COMPARISON:  Ultrasound on 01/23/2017 FINDINGS: Lower chest: No acute findings. Hepatobiliary: No mass visualized on this unenhanced exam. Hepatic cirrhosis demonstrated. Multiple tiny gallstones are seen. Gallbladder is nondilated and mild diffuse gallbladder wall thickening appears stable compared to previous ultrasound. No evidence of acute pericholecystic inflammatory changes or fluid collections. No evidence of biliary ductal dilatation. Pancreas: No mass or inflammatory process visualized on this unenhanced exam. Spleen:  Within normal limits in size. Adrenals/Urinary tract: No evidence of urolithiasis or hydronephrosis. Small fluid attenuation cyst noted in lower pole of left kidney. Unremarkable unopacified urinary bladder. Stomach/Bowel: No evidence of obstruction, inflammatory process, or abnormal fluid collections. Normal appendix visualized. Vascular/Lymphatic: No pathologically enlarged lymph nodes identified. No evidence of abdominal aortic aneurysm. Aortic atherosclerosis. Esophageal varices, consistent with portal venous hypertension. Mild mesenteric and retroperitoneal edema, without evidence of ascites, also likely secondary to cirrhosis. Reproductive:  No mass or other significant abnormality. Other:  None. Musculoskeletal:  No suspicious bone lesions identified. IMPRESSION: Hepatic cirrhosis, and findings of portal venous hypertension including esophageal varices. Mild mesenteric and retroperitoneal edema, likely secondary to cirrhosis/portal venous hypertension. No evidence of ascites or focal inflammatory process. Cholelithiasis.  No radiographic evidence of acute cholecystitis. Electronically Signed   By: Earle Gell M.D.   On: 06/17/2017 18:06   Dg Chest 2 View  Result Date: 07/10/2017 CLINICAL DATA:  Dry cough since yesterday EXAM: CHEST - 2 VIEW COMPARISON:  06/17/2017  FINDINGS: The heart size and mediastinal contours are within normal limits. Both lungs are clear. The visualized skeletal structures are unremarkable. IMPRESSION: No active cardiopulmonary disease. Electronically Signed   By: Ashley Royalty M.D.   On: 07/10/2017 20:55   Dg Chest 2 View  Result Date: 06/17/2017 CLINICAL DATA:  62 year old male with history of sepsis. No chest pain or shortness of breath. EXAM: CHEST  2 VIEW COMPARISON:  Chest x-ray 04/26/2013. FINDINGS: Lung volumes are low. No consolidative airspace disease. No pleural effusions. No pneumothorax. No pulmonary nodule or mass noted. Pulmonary vasculature and the cardiomediastinal silhouette are within normal limits. Old healed fracture in the lateral aspect of the left seventh rib. IMPRESSION: 1. Low lung volumes without radiographic evidence of acute cardiopulmonary disease. Electronically Signed   By: Vinnie Langton M.D.   On: 06/17/2017 21:04   US Renal  Result Date: 06/17/2017 CLINICAL DATA:  Renal failure. EXAM: RENAL / URINARY TRACT ULTRASOUND COMPLETE COMPARISON:  Noncontrast CT earlier this day. Abdominal ultrasound 01/23/2017 FINDINGS: Right Kidney: Length: 10.5 cm. Echogenicity within normal limits. No mass or hydronephrosis visualized. Left Kidney: Length: 11.0 cm. Echogenicity within normal limits. Simple cyst in the lower pole measures 2.7 cm. No solid mass or hydronephrosis visualized. Bladder: Appears normal for degree of bladder distention. Incidental note of distended gallbladder containing stones and sludge. IMPRESSION: No obstructive uropathy. Left renal cyst, otherwise unremarkable sonographic appearance of both kidneys. Electronically Signed   By: Jeb Levering M.D.   On: 06/17/2017 22:56  Radiology No results found.  Procedures Procedures (including critical care time)  Medications Ordered in ED Medications  acetaminophen (TYLENOL) tablet 650 mg (not administered)  piperacillin-tazobactam (ZOSYN) IVPB 3.375  g (not administered)  vancomycin (VANCOCIN) IVPB 1000 mg/200 mL premix (not administered)     Initial Impression / Assessment and Plan / ED Course  I have reviewed the triage vital signs and the nursing notes.  Pertinent labs & imaging results that were available during my care of the patient were reviewed by me and considered in my medical decision making (see chart for details).     Code sepsis called based on sirs criteria of temperature and heart rate.  Source of infection unclear.  11:45 PM Patient feels much improved after treatment with intravenous antibiotics and Tylenol.  Sepsis - Repeat Assessment  Performed at:    1145 pm  Vitals     Blood pressure (!) 151/72, pulse 76, temperature 99.2 F (37.3 C), temperature source Oral, resp. rate (!) 30, height 5\' 6"  (1.676 m), weight 67.1 kg (148 lb), SpO2 99 %.  Heart:     Regular rate and rhythm  Lungs:    CTA  Capillary Refill:   <2 sec  Peripheral Pulse:   Radial pulse palpable  Skin:     Normal Color  Dr.Doutova from hospital service consulted and will arrange for overnight stay and lab work consistent with mildly worsening renal function from July 01, 2017 CBC consistent with anemia, thrombocytopenia and leukopenia.  New from July 01, 2017.    Doubt TTP.  Patient has no mental status change.  No rash.  He is immunocompromise due to chronic alcohol abuse Final Clinical Impressions(s) / ED Diagnoses  Diagnoses #1 febrile illness #2 anemia #3 thrombocytopenia Final diagnoses:  None  #4 renal insufficiency  ED Discharge Orders    None       Orlie Dakin, MD 07/11/17 0025

## 2017-07-10 NOTE — Progress Notes (Signed)
Pharmacy Antibiotic Note  Mark [name] is a 62 y.o. male recently admitted in Feb '19 with UGIB who presented to the [facility] on 07/10/2017 with concerning for sepsis. Pharmacy has been consulted for Vancomycin and Zosyn dosing. SCr 1.33, CrCl~50  Plan: 1. Vancomycin 1g IV x 1 followed by 500 mg IV every 12 hours 2. Zosyn 3.375g IV x 1 (over 30 minutes) followed by 3.375g IV every 8 hours (infused over 4 hours) 3. Will continue to follow HD schedule/duration, culture results, LOT, and antibiotic de-escalation plans   Height: 5\' 6"  (167.6 cm) Weight: 148 lb (67.1 kg) IBW/kg (Calculated) : 63.8  Temp (24hrs), Avg:102.2 F (39 C), Min:102.2 F (39 C), Max:102.2 F (39 C)  Recent Labs  Lab 07/09/17 1529  WBC 2.9*    Estimated Creatinine Clearance: 71.1 mL/min (by C-G formula based on SCr of 0.96 mg/dL).    No Known Allergies  Antimicrobials this admission: Vanc 3/15 >> Zosyn 3/15 >>  Dose adjustments this admission: n/a  Microbiology results: 3/15 BCx >>         Thank you for allowing pharmacy to be a part of this patient's care.  Lawson Radar 07/10/2017 8:42 PM

## 2017-07-11 DIAGNOSIS — I471 Supraventricular tachycardia: Secondary | ICD-10-CM | POA: Diagnosis present

## 2017-07-11 DIAGNOSIS — R6510 Systemic inflammatory response syndrome (SIRS) of non-infectious origin without acute organ dysfunction: Secondary | ICD-10-CM

## 2017-07-11 DIAGNOSIS — K7030 Alcoholic cirrhosis of liver without ascites: Secondary | ICD-10-CM | POA: Diagnosis present

## 2017-07-11 DIAGNOSIS — Z87891 Personal history of nicotine dependence: Secondary | ICD-10-CM | POA: Diagnosis not present

## 2017-07-11 DIAGNOSIS — Z8619 Personal history of other infectious and parasitic diseases: Secondary | ICD-10-CM | POA: Diagnosis not present

## 2017-07-11 DIAGNOSIS — E785 Hyperlipidemia, unspecified: Secondary | ICD-10-CM | POA: Diagnosis present

## 2017-07-11 DIAGNOSIS — I129 Hypertensive chronic kidney disease with stage 1 through stage 4 chronic kidney disease, or unspecified chronic kidney disease: Secondary | ICD-10-CM | POA: Diagnosis present

## 2017-07-11 DIAGNOSIS — D61818 Other pancytopenia: Secondary | ICD-10-CM | POA: Diagnosis present

## 2017-07-11 DIAGNOSIS — D649 Anemia, unspecified: Secondary | ICD-10-CM

## 2017-07-11 DIAGNOSIS — N179 Acute kidney failure, unspecified: Secondary | ICD-10-CM

## 2017-07-11 DIAGNOSIS — I361 Nonrheumatic tricuspid (valve) insufficiency: Secondary | ICD-10-CM | POA: Diagnosis not present

## 2017-07-11 DIAGNOSIS — D899 Disorder involving the immune mechanism, unspecified: Secondary | ICD-10-CM | POA: Diagnosis present

## 2017-07-11 DIAGNOSIS — N182 Chronic kidney disease, stage 2 (mild): Secondary | ICD-10-CM | POA: Diagnosis not present

## 2017-07-11 DIAGNOSIS — Z96651 Presence of right artificial knee joint: Secondary | ICD-10-CM | POA: Diagnosis present

## 2017-07-11 DIAGNOSIS — F1010 Alcohol abuse, uncomplicated: Secondary | ICD-10-CM | POA: Diagnosis present

## 2017-07-11 DIAGNOSIS — N189 Chronic kidney disease, unspecified: Secondary | ICD-10-CM | POA: Diagnosis present

## 2017-07-11 DIAGNOSIS — D696 Thrombocytopenia, unspecified: Secondary | ICD-10-CM | POA: Diagnosis not present

## 2017-07-11 DIAGNOSIS — R509 Fever, unspecified: Secondary | ICD-10-CM | POA: Diagnosis present

## 2017-07-11 DIAGNOSIS — Z8673 Personal history of transient ischemic attack (TIA), and cerebral infarction without residual deficits: Secondary | ICD-10-CM | POA: Diagnosis not present

## 2017-07-11 DIAGNOSIS — I252 Old myocardial infarction: Secondary | ICD-10-CM | POA: Diagnosis not present

## 2017-07-11 DIAGNOSIS — E871 Hypo-osmolality and hyponatremia: Secondary | ICD-10-CM | POA: Diagnosis present

## 2017-07-11 DIAGNOSIS — A4189 Other specified sepsis: Secondary | ICD-10-CM | POA: Diagnosis present

## 2017-07-11 DIAGNOSIS — R6520 Severe sepsis without septic shock: Secondary | ICD-10-CM | POA: Diagnosis present

## 2017-07-11 DIAGNOSIS — J101 Influenza due to other identified influenza virus with other respiratory manifestations: Secondary | ICD-10-CM | POA: Diagnosis present

## 2017-07-11 DIAGNOSIS — I8510 Secondary esophageal varices without bleeding: Secondary | ICD-10-CM | POA: Diagnosis present

## 2017-07-11 DIAGNOSIS — I2510 Atherosclerotic heart disease of native coronary artery without angina pectoris: Secondary | ICD-10-CM | POA: Diagnosis present

## 2017-07-11 HISTORY — DX: Systemic inflammatory response syndrome (sirs) of non-infectious origin without acute organ dysfunction: R65.10

## 2017-07-11 LAB — SODIUM, URINE, RANDOM: Sodium, Ur: 55 mmol/L

## 2017-07-11 LAB — COMPREHENSIVE METABOLIC PANEL
ALT: 16 U/L — ABNORMAL LOW (ref 17–63)
AST: 30 U/L (ref 15–41)
Albumin: 1.7 g/dL — ABNORMAL LOW (ref 3.5–5.0)
Alkaline Phosphatase: 102 U/L (ref 38–126)
Anion gap: 7 (ref 5–15)
BUN: 11 mg/dL (ref 6–20)
CO2: 17 mmol/L — ABNORMAL LOW (ref 22–32)
Calcium: 7.5 mg/dL — ABNORMAL LOW (ref 8.9–10.3)
Chloride: 107 mmol/L (ref 101–111)
Creatinine, Ser: 1.49 mg/dL — ABNORMAL HIGH (ref 0.61–1.24)
GFR calc Af Amer: 56 mL/min — ABNORMAL LOW (ref >60)
GFR calc non Af Amer: 48 mL/min — ABNORMAL LOW (ref >60)
Glucose, Bld: 84 mg/dL (ref 65–99)
Potassium: 4.6 mmol/L (ref 3.5–5.1)
Sodium: 131 mmol/L — ABNORMAL LOW (ref 135–145)
Total Bilirubin: 1 mg/dL (ref 0.3–1.2)
Total Protein: 7.3 g/dL (ref 6.5–8.1)

## 2017-07-11 LAB — RESPIRATORY PANEL BY PCR

## 2017-07-11 LAB — CBC
HCT: 22.5 % — ABNORMAL LOW (ref 39.0–52.0)
Hemoglobin: 7.5 g/dL — ABNORMAL LOW (ref 13.0–17.0)
MCH: 32.2 pg (ref 26.0–34.0)
MCHC: 33.3 g/dL (ref 30.0–36.0)
MCV: 96.6 fL (ref 78.0–100.0)
Platelets: 72 10*3/uL — ABNORMAL LOW (ref 150–400)
RBC: 2.33 MIL/uL — ABNORMAL LOW (ref 4.22–5.81)
RDW: 17.9 % — ABNORMAL HIGH (ref 11.5–15.5)
WBC: 2.5 10*3/uL — ABNORMAL LOW (ref 4.0–10.5)

## 2017-07-11 LAB — RAPID URINE DRUG SCREEN, HOSP PERFORMED
Amphetamines: NOT DETECTED
Barbiturates: NOT DETECTED
Benzodiazepines: NOT DETECTED
Cocaine: NOT DETECTED
Opiates: POSITIVE — AB
Tetrahydrocannabinol: NOT DETECTED

## 2017-07-11 LAB — OSMOLALITY, URINE: Osmolality, Ur: 569 mOsm/kg (ref 300–900)

## 2017-07-11 LAB — INFLUENZA PANEL BY PCR (TYPE A & B)
Influenza A By PCR: POSITIVE — AB
Influenza B By PCR: NEGATIVE

## 2017-07-11 LAB — CBG MONITORING, ED: Glucose-Capillary: 91 mg/dL (ref 65–99)

## 2017-07-11 LAB — MAGNESIUM: Magnesium: 1.6 mg/dL — ABNORMAL LOW (ref 1.7–2.4)

## 2017-07-11 LAB — TSH: TSH: 1.809 u[IU]/mL (ref 0.350–4.500)

## 2017-07-11 LAB — PHOSPHORUS: Phosphorus: 4.7 mg/dL — ABNORMAL HIGH (ref 2.5–4.6)

## 2017-07-11 LAB — CREATININE, URINE, RANDOM: Creatinine, Urine: 246.82 mg/dL

## 2017-07-11 LAB — PREALBUMIN: Prealbumin: <5 mg/dL — ABNORMAL LOW (ref 18–38)

## 2017-07-11 LAB — PROCALCITONIN: Procalcitonin: <0.1 ng/mL

## 2017-07-11 MED ORDER — LORAZEPAM 2 MG/ML IJ SOLN: 1.0000 mg | Freq: Four times a day (QID) | INTRAMUSCULAR | Status: DC | PRN

## 2017-07-11 MED ORDER — VITAMIN B-1 100 MG PO TABS
100.0000 mg | ORAL_TABLET | Freq: Every day | ORAL | Status: DC
  Administered 2017-07-11 – 2017-07-14 (×4): 100 mg via ORAL
  Filled 2017-07-11 (×4): qty 1

## 2017-07-11 MED ORDER — NADOLOL 20 MG PO TABS
20.0000 mg | ORAL_TABLET | Freq: Every day | ORAL | Status: DC
  Administered 2017-07-11 – 2017-07-14 (×4): 20 mg via ORAL
  Filled 2017-07-11 (×5): qty 1

## 2017-07-11 MED ORDER — FOLIC ACID 1 MG PO TABS
1.0000 mg | ORAL_TABLET | Freq: Every day | ORAL | Status: DC
  Administered 2017-07-11 – 2017-07-14 (×4): 1 mg via ORAL
  Filled 2017-07-11 (×4): qty 1

## 2017-07-11 MED ORDER — SODIUM CHLORIDE 0.9 % IV BOLUS (SEPSIS)
500.0000 mL | Freq: Once | INTRAVENOUS | Status: AC
  Administered 2017-07-11: 500 mL via INTRAVENOUS

## 2017-07-11 MED ORDER — CLARITHROMYCIN 500 MG PO TABS
500.0000 mg | ORAL_TABLET | Freq: Two times a day (BID) | ORAL | Status: DC
  Administered 2017-07-11 – 2017-07-12 (×4): 500 mg via ORAL
  Filled 2017-07-11 (×4): qty 1

## 2017-07-11 MED ORDER — SODIUM CHLORIDE 0.9 % IV SOLN
INTRAVENOUS | Status: AC
  Administered 2017-07-11: 05:00:00 via INTRAVENOUS

## 2017-07-11 MED ORDER — ADENOSINE 6 MG/2ML IV SOLN
INTRAVENOUS | Status: AC
  Administered 2017-07-11: 12 mg
  Filled 2017-07-11: qty 8

## 2017-07-11 MED ORDER — RIFAXIMIN 550 MG PO TABS
550.0000 mg | ORAL_TABLET | Freq: Two times a day (BID) | ORAL | Status: DC
  Administered 2017-07-11 – 2017-07-14 (×8): 550 mg via ORAL
  Filled 2017-07-11 (×10): qty 1

## 2017-07-11 MED ORDER — ONDANSETRON HCL 4 MG PO TABS: 4.0000 mg | ORAL_TABLET | Freq: Four times a day (QID) | ORAL | Status: DC | PRN

## 2017-07-11 MED ORDER — ENSURE ENLIVE PO LIQD
237.0000 mL | Freq: Three times a day (TID) | ORAL | Status: DC
  Administered 2017-07-11 – 2017-07-14 (×6): 237 mL via ORAL
  Filled 2017-07-11 (×2): qty 237

## 2017-07-11 MED ORDER — ONDANSETRON HCL 4 MG/2ML IJ SOLN: 4.0000 mg | Freq: Four times a day (QID) | INTRAMUSCULAR | Status: DC | PRN

## 2017-07-11 MED ORDER — GUAIFENESIN ER 600 MG PO TB12
600.0000 mg | ORAL_TABLET | Freq: Two times a day (BID) | ORAL | Status: DC
  Administered 2017-07-11 – 2017-07-13 (×6): 600 mg via ORAL
  Filled 2017-07-11 (×6): qty 1

## 2017-07-11 MED ORDER — LORAZEPAM 1 MG PO TABS
1.0000 mg | ORAL_TABLET | Freq: Four times a day (QID) | ORAL | Status: DC | PRN
  Administered 2017-07-11: 1 mg via ORAL
  Filled 2017-07-11: qty 1

## 2017-07-11 MED ORDER — ACETAMINOPHEN 325 MG PO TABS: 650.0000 mg | ORAL_TABLET | Freq: Four times a day (QID) | ORAL | Status: DC | PRN

## 2017-07-11 MED ORDER — ADULT MULTIVITAMIN W/MINERALS CH
1.0000 | ORAL_TABLET | Freq: Every day | ORAL | Status: DC
  Administered 2017-07-11 – 2017-07-14 (×4): 1 via ORAL
  Filled 2017-07-11 (×4): qty 1

## 2017-07-11 MED ORDER — ACETAMINOPHEN 650 MG RE SUPP: 650.0000 mg | Freq: Four times a day (QID) | RECTAL | Status: DC | PRN

## 2017-07-11 MED ORDER — METOPROLOL TARTRATE 5 MG/5ML IV SOLN
5.0000 mg | Freq: Once | INTRAVENOUS | Status: AC
Start: 2017-07-11 — End: 2017-07-11
  Administered 2017-07-11: 5 mg via INTRAVENOUS

## 2017-07-11 MED ORDER — OSELTAMIVIR PHOSPHATE 30 MG PO CAPS
30.0000 mg | ORAL_CAPSULE | Freq: Two times a day (BID) | ORAL | Status: DC
  Administered 2017-07-11 – 2017-07-14 (×6): 30 mg via ORAL
  Filled 2017-07-11 (×7): qty 1

## 2017-07-11 MED ORDER — ALBUTEROL SULFATE (2.5 MG/3ML) 0.083% IN NEBU
2.5000 mg | INHALATION_SOLUTION | RESPIRATORY_TRACT | Status: DC | PRN
  Administered 2017-07-13: 2.5 mg via RESPIRATORY_TRACT
  Filled 2017-07-11: qty 3

## 2017-07-11 MED ORDER — PANTOPRAZOLE SODIUM 40 MG PO TBEC
40.0000 mg | DELAYED_RELEASE_TABLET | Freq: Two times a day (BID) | ORAL | Status: DC
  Administered 2017-07-11 – 2017-07-14 (×8): 40 mg via ORAL
  Filled 2017-07-11 (×8): qty 1

## 2017-07-11 MED ORDER — METOPROLOL TARTRATE 5 MG/5ML IV SOLN
INTRAVENOUS | Status: AC
  Filled 2017-07-11: qty 5

## 2017-07-11 MED ORDER — ADENOSINE 6 MG/2ML IV SOLN
INTRAVENOUS | Status: AC
  Administered 2017-07-11: 6 mg
  Filled 2017-07-11: qty 2

## 2017-07-11 MED ORDER — METOPROLOL TARTRATE 5 MG/5ML IV SOLN
5.0000 mg | Freq: Once | INTRAVENOUS | Status: AC
  Administered 2017-07-11: 5 mg via INTRAVENOUS
  Filled 2017-07-11: qty 5

## 2017-07-11 MED ORDER — LACTULOSE 10 GM/15ML PO SOLN
30.0000 g | Freq: Three times a day (TID) | ORAL | Status: DC
  Administered 2017-07-11 – 2017-07-14 (×10): 30 g via ORAL
  Filled 2017-07-11 (×12): qty 45

## 2017-07-11 NOTE — ED Notes (Addendum)
12 mg of Adenosine given, per MD Wilson Singer

## 2017-07-11 NOTE — Evaluation (Addendum)
Physical Therapy Evaluation Patient Details Name: Mark [name] MRN: [id number] DOB: 1956/03/02 Today's Date: 07/11/2017   History of Present Illness  Pt is a 62 y/o male presenting with generalized fatigue and flu like symptoms. Admitted with severe dehydration and sepsis, acute on chronic kidney failure. PMH alcohol abuse, cirrhosis, heart murmur, HTN, LE swelling, hx MI, hx CVA, hx hernia repair, B knee arthroscopy, hx TKR   Clinical Impression  Patient presents with decreased endurance and stability with gait. He would benefit from skilled acute therapy as well as therapy at home or at a SNF depending on the amount of assistance that can  Be provided at home.     Follow Up Recommendations Home health PT;SNF(depending on progress ) Home with 24 hour supervision    Equipment Recommendations  Rolling walker with 5" wheels    Recommendations for Other Services       Precautions / Restrictions Precautions Precautions: Fall Restrictions Weight Bearing Restrictions: No      Mobility  Bed Mobility Overal bed mobility: Needs Assistance Bed Mobility: Sit to Supine     Supine to sit: Supervision Sit to supine: Supervision   General bed mobility comments: able to get himself in and out of bed. Supervision for safety   Transfers Overall transfer level: Needs assistance Equipment used: Rolling walker (2 wheeled) Transfers: Sit to/from Stand Sit to Stand: Min assist Stand pivot transfers: Min assist       General transfer comment: Min a for balance and min cuing for safety when standing   Ambulation/Gait Ambulation/Gait assistance: Min assist Ambulation Distance (Feet): 25 Feet Assistive device: Rolling walker (2 wheeled) Gait Pattern/deviations: Step-through pattern Gait velocity: decreased Gait velocity interpretation: Below normal speed for age/gender General Gait Details: Patient ambualted to the bathroom but became SOB. He had no shortness of breath    Stairs            Wheelchair Mobility    Modified Rankin (Stroke Patients Only)       Balance Overall balance assessment: Needs assistance Sitting-balance support: No upper extremity supported Sitting balance-[name]: Fair     Standing balance support: Bilateral upper extremity supported Standing balance-[name]: Poor Standing balance comment: requires B UE support                             Pertinent Vitals/Pain Pain Assessment: No/denies pain    Home Living   Living Arrangements: Spouse/significant other Available Help at Discharge: Family Type of Home: House Home Access: Stairs to enter Entrance Stairs-Rails: None Entrance Stairs-Number of Steps: 2 Home Layout: One level Home Equipment: Environmental consultant - 2 wheels;Cane - single point      Prior Function Level of Independence: Independent               Hand Dominance        Extremity/Trunk Assessment   Upper Extremity Assessment Upper Extremity Assessment: Defer to OT evaluation    Lower Extremity Assessment Lower Extremity Assessment: Overall WFL for tasks assessed       Communication   Communication: Other (comment)  Cognition Arousal/Alertness: Awake/alert Behavior During Therapy: Impulsive                     Orientation Level: Situation;Time Current Attention Level: Sustained                  General Comments      Exercises  Assessment/Plan    PT Assessment    PT Problem List         PT Treatment Interventions DME instruction;Therapeutic activities;Gait training;Therapeutic exercise;Patient/family education;Stair training;Balance training;Functional mobility training;Neuromuscular re-education    PT Goals (Current goals can be found in the Care Plan section)  Acute Rehab PT Goals Patient Stated Goal: to get better  PT Goal Formulation: With patient Time For Goal Achievement: 07/06/17 Potential to Achieve Goals: Good    Frequency Min  3X/week   Barriers to discharge        Co-evaluation               AM-PAC PT "6 Clicks" Daily Activity  Outcome Measure Difficulty turning over in bed (including adjusting bedclothes, sheets and blankets)?: A Little Difficulty moving from lying on back to sitting on the side of the bed? : A Little Difficulty sitting down on and standing up from a chair with arms (e.g., wheelchair, bedside commode, etc,.)?: A Little Help needed moving to and from a bed to chair (including a wheelchair)?: A Little Help needed walking in hospital room?: A Lot Help needed climbing 3-5 steps with a railing? : A Lot 6 Click Score: 16    End of Session Equipment Utilized During Treatment: Gait belt Activity Tolerance: Patient tolerated treatment well;Patient limited by fatigue Patient left: in bed;with call bell/phone within reach;with bed alarm set Nurse Communication: Mobility status PT Visit Diagnosis: Unsteadiness on feet (R26.81);Muscle weakness (generalized) (M62.81);Difficulty in walking, not elsewhere classified (R26.2)    Time: 7902-4097 PT Time Calculation (min) (ACUTE ONLY): 23 min   Charges:   PT Evaluation $PT Eval Moderate Complexity: 1 Mod     PT G Codes:          Carney Living PT DPT 07/11/2017, 3:34 PM

## 2017-07-11 NOTE — ED Notes (Signed)
Assisted pt to restroom by wheelchair, no issues returned back to room.

## 2017-07-11 NOTE — ED Notes (Signed)
Ryland, RN, Myriam Jacobson, RN, Maudie Mercury, EMT, and MD Wilson Singer at bedside.

## 2017-07-11 NOTE — ED Notes (Signed)
ED Provider at bedside. 

## 2017-07-11 NOTE — H&P (Signed)
Mark [name] JXB:[id number] DOB: Nov 25, 1955 DOA: 07/10/2017     PCP: [name], MD   Outpatient Specialists:none   Patient coming from: home Lives With family    Chief Complaint:  Chief Complaint  Patient presents with  . Weakness  Abdominal distention  HPI: Mark [name] is a 63 y.o. male with medical history significant of advancedalcoholic cirrhosis, ongoing alcohol abuse, history of esophageal varices, chronic anemia, hypertension, coronary artery disease with prior MI, thrombocytopenia,severe hypoalbuminemia   Presented with abdominal distention and generalized fatigue cough for past 1 day Associated with slight cough.  Abdominal distention rapidly improved he is asking for food.  Noted to be febrile in the emergency department.  Denies Ongoing alcohol abuse of heroin abuse states he never injected heroin but only snorted states he has not used it since his last admission.  Patient states he is currently feeling much better wants to eat. Denies any abdominal pain.  Denies any sick contacts reports compliance to home medications.  Reports loose stools but patient is compliant with her lactulose  Recently admitted in the end of February with generalized weakness nausea and vomiting and right leg swelling was found to have severe sepsis secondary to cellulitis of right leg resulting in [place] a strep bacteremia and acute kidney failure requiring ICU stay while hospitalized developed active GI bleed requiring 2 unit blood transfusion EGD was done on 25 February showed ulcerative duodenitis.  Positive for H. pylori he was started with triple therapy. Recently had episode of severe delirium by the time of discharge he improved he was supposed to go to SNF but refused and was taking home under care of his wife for 24-hour home care.   While in ER: Febrile given Tylenol currently improving Significant initial  Findings:  Lactic acid 1.53> 1.17  WBC 2.3 down  from discharge 5.1 Hemoglobin & Hematocrit Down from discharge 8.7    Component Value Date/Time   HGB 7.9 (L) 07/10/2017 2018   HCT 23.4 (L) 07/10/2017 2018   PLT 69  INR 1.4  Na 131 k 4.4 Alb 1.7 CR up from baseline Lab Results  Component Value Date   CREATININE 1.33 (H) 07/10/2017   CREATININE 0.96 07/01/2017   CREATININE 0.89 06/27/2017     CXR non acute     IN ER:  Temp (24hrs), Avg:101.2 F (38.4 C), Min:99.2 F (37.3 C), Max:102.2 F (39 C)      on arrival  ED Triage Vitals  Enc Vitals Group     BP 07/10/17 2013 (!) 151/72     Pulse Rate 07/10/17 2005 (!) 101     Resp 07/10/17 2005 18     Temp 07/10/17 2005 (!) 102.2 F (39 C)     Temp Source 07/10/17 2005 Rectal     SpO2 07/10/17 2000 99 %     Weight 07/10/17 2006 148 lb (67.1 kg)     Height 07/10/17 2006 5\' 6"  (1.676 m)     Head Circumference --      Peak Flow --      Pain Score 07/10/17 2006 0     Pain Loc --      Pain Edu? --      Excl. in [place]? --      Latest   Blood pressure (!) 151/72, pulse 76, temperature 99.2 F (37.3 C), temperature source Oral, resp. rate (!) 30, height 5\' 6"  (1.676 m), weight 67.1 kg (148 lb), SpO2 99 %.  Following Medications were ordered in ER: Medications  piperacillin-tazobactam (ZOSYN) IVPB 3.375 g (not administered)  vancomycin (VANCOCIN) 500 mg in sodium chloride 0.9 % 100 mL IVPB (not administered)  acetaminophen (TYLENOL) tablet 650 mg (650 mg Oral Given 07/10/17 2106)  piperacillin-tazobactam (ZOSYN) IVPB 3.375 g (0 g Intravenous Stopped 07/10/17 2156)  vancomycin (VANCOCIN) IVPB 1000 mg/200 mL premix (0 mg Intravenous Stopped 07/10/17 2229)     Hospitalist was called for admission for Sirs   Review of Systems:    Pertinent positives include: Fevers, chills, fatigue, abdominal distention  Constitutional:  No weight loss, night sweats,   weight loss  HEENT:  No headaches, Difficulty swallowing,Tooth/dental problems,Sore throat,  No sneezing, itching, ear  ache, nasal congestion, post nasal drip,  Cardio-vascular:  No chest pain, Orthopnea, PND, anasarca, dizziness, palpitations.no Bilateral lower extremity swelling  GI:  No heartburn, indigestion, abdominal pain, nausea, vomiting, diarrhea, change in bowel habits, loss of appetite, melena, blood in stool, hematemesis Resp:  no shortness of breath at rest. No dyspnea on exertion, No excess mucus, no productive cough, No non-productive cough, No coughing up of blood.No change in color of mucus.No wheezing. Skin:  no rash or lesions. No jaundice GU:  no dysuria, change in color of urine, no urgency or frequency. No straining to urinate.  No flank pain.  Musculoskeletal:  No joint pain or no joint swelling. No decreased range of motion. No back pain.  Psych:  No change in mood or affect. No depression or anxiety. No memory loss.  Neuro: no localizing neurological complaints, no tingling, no weakness, no double vision, no gait abnormality, no slurred speech, no confusion  As per HPI otherwise 10 point review of systems negative.   Past Medical History: Past Medical History:  Diagnosis Date  . Alcohol abuse   . Anemia   . Arthritis   . Ascites   . Cirrhosis ([place])   . Heart murmur   . Hyperlipidemia   . Hypertension   . Leg swelling   . Myocardial infarction ([place]) 2012  . Stroke [facility]) 2012   no deficits  . Thrombocytopenia ([place])    Past Surgical History:  Procedure Laterality Date  . COLONOSCOPY WITH PROPOFOL N/A 02/07/2016   Procedure: COLONOSCOPY WITH PROPOFOL;  Surgeon: Milus Banister, MD;  Location: [facility] ENDOSCOPY;  Service: Endoscopy;  Laterality: N/A;  . ESOPHAGOGASTRODUODENOSCOPY (EGD) WITH PROPOFOL N/A 02/07/2016   Procedure: ESOPHAGOGASTRODUODENOSCOPY (EGD) WITH PROPOFOL;  Surgeon: Milus Banister, MD;  Location: [facility] ENDOSCOPY;  Service: Endoscopy;  Laterality: N/A;  . ESOPHAGOGASTRODUODENOSCOPY (EGD) WITH PROPOFOL N/A 06/22/2017   Procedure: ESOPHAGOGASTRODUODENOSCOPY (EGD)  WITH PROPOFOL;  Surgeon: Jerene Bears, MD;  Location: [facility] ENDOSCOPY;  Service: Gastroenterology;  Laterality: N/A;  . HERNIA REPAIR Right    inguinal  . KNEE ARTHROSCOPY     bilateral/  12/14  . TOTAL KNEE ARTHROPLASTY Right 05/02/2013   Procedure: RIGHT TOTAL KNEE ARTHROPLASTY;  Surgeon: Mauri Pole, MD;  Location: [facility] ORS;  Service: Orthopedics;  Laterality: Right;     Social History:     reports that he quit smoking about 18 months ago. His smoking use included cigarettes. He has a 5.00 pack-year smoking history. he has never used smokeless tobacco. He reports that he uses drugs. Drugs: Marijuana and Heroin. He reports that he does not drink alcohol.  Allergies:  No Known Allergies     Family History:   Family History  Problem Relation Age of Onset  . Hypertension Father   . Diabetes  Father   . Dementia Mother   . Lupus Sister     Medications: Prior to Admission medications   Medication Sig Start Date End Date Taking? Authorizing Provider  clarithromycin (BIAXIN) 500 MG tablet Take 500 mg by mouth every 12 (twelve) hours.    [provider]  folic acid (FOLVITE) 1 MG tablet Take 1 tablet (1 mg total) by mouth daily. 06/28/17   Dessa Phi, DO  lactulose (CHRONULAC) 10 GM/15ML solution Take 45 mLs (30 g total) by mouth 3 (three) times daily. 06/27/17 07/27/17  Dessa Phi, DO  nadolol (CORGARD) 20 MG tablet Take 1 tablet (20 mg total) by mouth daily. 01/19/17   Levin Erp, PA  pantoprazole (PROTONIX) 40 MG tablet Take 1 tablet (40 mg total) by mouth 2 (two) times daily. 06/27/17 08/26/17  Dessa Phi, DO  RA VITAMIN B-1 100 MG TABS  06/30/17   [provider]  rifaximin (XIFAXAN) 550 MG TABS tablet Take 1 tablet (550 mg total) by mouth 2 (two) times daily. 06/27/17 07/27/17  Dessa Phi, DO  thiamine 100 MG tablet Take 1 tablet (100 mg total) by mouth daily. 06/28/17   Dessa Phi, DO    Physical Exam: Patient Vitals for the past 24 hrs:  BP Temp  Temp src Pulse Resp SpO2 Height Weight  07/10/17 2245 (!) 151/72 - - 76 (!) 30 99 % - -  07/10/17 2235 - 99.2 F (37.3 C) Oral - - - - -  07/10/17 2215 (!) 155/73 - - 81 (!) 23 95 % - -  07/10/17 2145 135/73 - - 82 (!) 30 99 % - -  07/10/17 2100 (!) 144/92 - - 87 (!) 28 99 % - -  07/10/17 2013 (!) 151/72 (!) 102.2 F (39 C) Rectal (!) 101 18 99 % - -  07/10/17 2006 - - - - - - 5\' 6"  (1.676 m) 67.1 kg (148 lb)  07/10/17 2005 - (!) 102.2 F (39 C) Rectal (!) 101 18 99 % - -  07/10/17 2000 - - - - - 99 % - -    1. General:  in No Acute distress   Chronically ill  -appearing 2. Psychological: Alert and Oriented 3. Head/ENT:   Moist  Mucous Membranes                          Head Non traumatic, neck supple                           Poor Dentition 4. SKIN:   decreased Skin turgor,  Skin clean Dry and area of ulceration on right ankle. No purulent drainage. Mild edema    5. Heart: Regular rate and rhythm mild Murmur, no Rub or gallop 6. Lungs:  no wheezes or crackles   7. Abdomen: Soft,  non-tender, Non distended  bowel sounds present 8. Lower extremities: no clubbing, cyanosis, mild right foot edema 9. Neurologically Grossly intact, moving all 4 extremities equally  10. MSK: Normal range of motion   body mass index is 23.89 kg/m.  Labs on Admission:   Labs on Admission: I have personally reviewed following labs and imaging studies  CBC: Recent Labs  Lab 07/09/17 1529 07/10/17 2018  WBC 2.9* 2.3*  NEUTROABS 1.9 1.6*  HGB 7.5 Repeated and verified X2.* 7.9*  HCT 22.0 Repeated and verified X2.* 23.4*  MCV 98.6 97.5  PLT 73.0* 69*  Basic Metabolic Panel: Recent Labs  Lab 07/10/17 2018  NA 131*  K 4.4  CL 108  CO2 18*  GLUCOSE 102*  BUN 11  CREATININE 1.33*  CALCIUM 7.8*   GFR: Estimated Creatinine Clearance: 51.3 mL/min (A) (by C-G formula based on SCr of 1.33 mg/dL (H)). Liver Function Tests: Recent Labs  Lab 07/10/17 2018  AST 33  ALT 17  ALKPHOS 111    BILITOT 0.9  PROT 7.6  ALBUMIN 1.7*   No results for input(s): LIPASE, AMYLASE in the last 168 hours. No results for input(s): AMMONIA in the last 168 hours. Coagulation Profile: Recent Labs  Lab 07/10/17 2018  INR 1.40   Cardiac Enzymes: No results for input(s): CKTOTAL, CKMB, CKMBINDEX, TROPONINI in the last 168 hours. BNP (last 3 results) No results for input(s): PROBNP in the last 8760 hours. HbA1C: No results for input(s): HGBA1C in the last 72 hours. CBG: No results for input(s): GLUCAP in the last 168 hours. Lipid Profile: No results for input(s): CHOL, HDL, LDLCALC, TRIG, CHOLHDL, LDLDIRECT in the last 72 hours. Thyroid Function Tests: No results for input(s): TSH, T4TOTAL, FREET4, T3FREE, THYROIDAB in the last 72 hours. Anemia Panel: No results for input(s): VITAMINB12, FOLATE, FERRITIN, TIBC, IRON, RETICCTPCT in the last 72 hours. Urine analysis:    Component Value Date/Time   COLORURINE AMBER (A) 07/10/2017 2246   APPEARANCEUR CLEAR 07/10/2017 2246   LABSPEC 1.019 07/10/2017 2246   PHURINE 5.0 07/10/2017 2246   GLUCOSEU NEGATIVE 07/10/2017 2246   HGBUR NEGATIVE 07/10/2017 2246   BILIRUBINUR NEGATIVE 07/10/2017 2246   KETONESUR NEGATIVE 07/10/2017 2246   PROTEINUR NEGATIVE 07/10/2017 2246   UROBILINOGEN 2.0 (H) 04/26/2013 0945   NITRITE NEGATIVE 07/10/2017 2246   LEUKOCYTESUR NEGATIVE 07/10/2017 2246   Sepsis Labs: @LABRCNTIP (procalcitonin:4,lacticidven:4) )No results found for this or any previous visit (from the past 240 hour(s)).     UA  no evidence of UTI     Lab Results  Component Value Date   HGBA1C  11/01/2009    5.1 (NOTE)                                                                       According to the ADA Clinical Practice Recommendations for 2011, when HbA1c is used as a screening test:   >=6.5%   Diagnostic of Diabetes Mellitus           (if abnormal result  is confirmed)  5.7-6.4%   Increased risk of developing Diabetes Mellitus   References:Diagnosis and Classification of Diabetes Mellitus,Diabetes FUXN,2355,73(UKGUR 1):S62-S69 and Standards of Medical Care in         Diabetes - 2011,Diabetes KYHC,6237,62  (Suppl 1):S11-S61.    Estimated Creatinine Clearance: 51.3 mL/min (A) (by C-G formula based on SCr of 1.33 mg/dL (H)).  BNP (last 3 results) No results for input(s): PROBNP in the last 8760 hours.   ECG REPORT  Independently reviewed Rate: 82  Rhythm: NSR ST&T Change: No acute ischemic changes  QTC 452  Filed Weights   07/10/17 2006  Weight: 67.1 kg (148 lb)     Cultures:    Component Value Date/Time   SDES BLOOD LEFT HAND 06/21/2017 1145   SPECREQUEST IN PEDIATRIC BOTTLE Blood Culture adequate volume  06/21/2017 1145   CULT  06/21/2017 1145    NO GROWTH 5 DAYS Performed at [facility], [place] [address], [place], [place] [address]    REPTSTATUS 06/26/2017 FINAL 06/21/2017 1145     Radiological Exams on Admission: Dg Chest 2 View  Result Date: 07/10/2017 CLINICAL DATA:  Dry cough since yesterday EXAM: CHEST - 2 VIEW COMPARISON:  06/17/2017 FINDINGS: The heart size and mediastinal contours are within normal limits. Both lungs are clear. The visualized skeletal structures are unremarkable. IMPRESSION: No active cardiopulmonary disease. Electronically Signed   By: Ashley Royalty M.D.   On: 07/10/2017 20:55    Chart has been reviewed    Assessment/Plan   62 y.o. male with medical history significant of advancedalcoholic cirrhosis, ongoing alcohol abuse, history of esophageal varices, chronic anemia, hypertension, coronary artery disease with prior MI, thrombocytopenia,severe hypoalbuminemia Admitted for SIRS  Present on Admission: . SIRS (systemic inflammatory response syndrome) ([name]) -unknown source, evaluate for possible viral illness.  No evidence of abdominal distention currently to suggest ascites no abdominal tenderness.  Given recent admission will initiate broad-spectrum abdomen  antibiotics await results of blood cultures and urine culture. Given recurrent fever and history of drug abuse although denies IV drug use but known history of heroin will obtain echogram. Diarrhea  - most likely secondary to use of lactulose    . Acute on chronic anemia -somewhat down from prior.  Did not endorse any history of GI bleeding currently.  We will need to discuss further with family in the morning.  Potential leak has continues slow blood loss in the setting of thrombocytopenia. . Acute on chronic renal failure ([name]) somewhat worsening renal function will gently rehydrate . Cirrhosis, alcoholic ([place]) chronic currently states he stopped drinking alcohol . Hyponatremia - in the setting of cirrhosis currently no evidence of fluid overload.  Obtain urine electrolytes . Thrombocytopenia ([place]) chronic likely secondary to cirrhosis History of alcohol abuse currently states has discontinued will monitor for any signs of withdrawal History of H. pylori - continue home medications Recent admission for cellulitis currently lower extremity and previous much improved.     Other plan as per orders.  DVT prophylaxis:  SCD    Code Status:  FULL CODE as per patient    Family Communication:   Family not  at  Bedside    Disposition Plan:   likely will need placement for rehabilitation                                                 Would benefit from PT/OT eval prior to DC   ordered                  Nutrition   consulted                          Consults called: none    Admission status: inpatient     Level of care     tele        I have spent a total of 56 min on this admission   Camrynn Mcclintic 07/11/2017, 1:14 AM    [facility]  Pager [phone]   after 2 AM please page floor coverage PA If 7AM-7PM, please contact the day team taking care of the patient  [email or web address]  Password [email or web address]

## 2017-07-11 NOTE — ED Notes (Signed)
Awaiting medications from pharmacy.

## 2017-07-11 NOTE — Progress Notes (Signed)
Mark [name] is a 62 y.o. male patient admitted from ED awake, alert - oriented  X 4 - no acute distress noted.  VSS - Blood pressure 101/69, pulse (!) 129, temperature 98.3 F (36.8 C), temperature source Oral, resp. rate (!) 24, height 5\' 6"  (1.676 m), weight 66.9 kg (147 lb 7.8 oz), SpO2 93 %.    IV in place, occlusive dsg intact without redness.  Orientation to room, and floor completed with information packet given to patient/family.  Patient declined safety video at this time.  Admission INP armband ID verified with patient/family, and in place.   SR up x 2, fall assessment complete, with patient and family able to verbalize understanding of risk associated with falls, and verbalized understanding to call nsg before up out of bed.  Call light within reach, patient able to voice, and demonstrate understanding.  Skin, clean-dry- intact without evidence of bruising, or skin tears. A venous skin ulcer noted on right foot, they are on different spot of the foot.  No evidence of skin break down noted on exam.     Will cont to eval and treat per MD orders.  Dorris Carnes, RN 07/11/2017 7:18 PM

## 2017-07-11 NOTE — ED Notes (Signed)
PAGED ADMITTING/OSEI-BONSU TO Darrel Reach, RN x 2

## 2017-07-11 NOTE — ED Notes (Signed)
Paged MD to notify of HR

## 2017-07-11 NOTE — ED Notes (Signed)
Dr. Vista Lawman paged to [phone]-per RN-paged by Levada Dy

## 2017-07-11 NOTE — ED Notes (Signed)
Pt was cleaned and washed up at this time

## 2017-07-11 NOTE — Progress Notes (Signed)
Seen and Evaluated on rounds.  Clinical diabetes including lab work and admit H&P reviewed and noted. Mark [name] is a 62 y.o. male with medical history significant  for but not limited to advancedalcoholic cirrhosis, ongoing alcohol abuse, history of esophageal varices,coronary artery disease with prior MI, Thrombocytopenia,severe hypoalbuminemia presenting 1 day history of generalized weakness and cough associated with fever, leukopenia, tachycardia, tachypnea elevated lactic acid level.  X-ray of the chest was negative for any acute infiltrate.  Patient admitted for systemic inflammatory response syndrome after initial ED and history of IV fluids with antibiotics.  Patient resting comfortably not in acute distress.  Lungs clear to auscultation, no tachycardic, right foot ulcer with edema noted.

## 2017-07-11 NOTE — Progress Notes (Signed)
Report received from thye ED. Pt arrived to the unit at 1830 via bed.

## 2017-07-11 NOTE — ED Notes (Signed)
12 mg of Adenosine given per MD Wilson Singer

## 2017-07-11 NOTE — ED Notes (Addendum)
Spoke with admitting provider regarding pt status. Made MD aware pt was given 10/08/10 of adenosine with no change in HR. MD states he will put in new bed request for stepdown unit.

## 2017-07-11 NOTE — Progress Notes (Signed)
Initial Nutrition Assessment  DOCUMENTATION CODES:  Not applicable  INTERVENTION:  Ensure Enlive po TID, each supplement provides 350 kcal and 20 grams of protein  Food requests noted and to be passed to dietary to promote intake  Of note, IF patients intake report is accurate, he is meeting his kcal/pro needs at home. He still refuses SNF  NUTRITION DIAGNOSIS:  Increased nutrient needs related to chronic illness(Cirrhosis) as evidenced by estimated nutritional requirements for this condition   GOAL:  Patient will meet greater than or equal to 90% of their needs  MONITOR:  PO intake, Supplement acceptance, Weight trends  REASON FOR ASSESSMENT:  Consult Assessment of nutrition requirement/status  ASSESSMENT:  62 y/o male PMHx heroin use, advanced etoh related cirrhosis, esophageal varices, chronic anemia, HTN/HLD,  CKD, CAD, MI, severe hypoalbuminemia. Recently admitted at end of February w/ sepsis r/t cellulitis, c/b GIB, delirium. Refused SNF. RePresented w/ abdominal distention, generalized fatigue, and cough x1 day. Found to have AKI and fever w/ SIRS criteria met. Admitted for further workup/evaluation   Pt reports that PTA he was eating "quite well". He defines this at eating 2 decent sized meals/day. He also says he was drinking "a case" of Ensure per day. RD asked him to verify he was drinking an entire case and he says he was drinking a six pack of ensure/day. Denies any more etoh intake. He reports following a low sodium diet, saying his wife wont let him have salt.   Denies n/v/c/d (other than lactulose induced)  Bed weight today is 66.9 kg. He says his UBW is 170? Per chart, his weight has fluctuated between 145-160 for the past year. He denies any abdominal swelling and says that he didn't even present with swelling, rather just an increased sense of fullness, like he had overeaten.   At this time, the patient says his appetite is good. He is requesting Ensure. Will  order TID. RD asked what items he would assuredly eat. He said "cream potatoes and green peas". Will try to order these intermittently.   RD tried to advocate for him to go to SNF as this could help improve his functional status and likely his nutritional status by association. He declines, saying he can do it all at home.   Physical Exam: No discernible muscle/fat wasting. No lower extremity edema.   Labs: Na: 131, H/H:7.5/22.5, Albumin:1.7, Phos:4.7, Mag: 1.6, Creat: 1.49-up from 1.33 yesterday Meds: Folate, Lactulose, PPI, Rifaximin, Thiamin, IV abx,   Recent Labs  Lab 07/10/17 2018 07/11/17 0208  NA 131* 131*  K 4.4 4.6  CL 108 107  CO2 18* 17*  BUN 11 11  CREATININE 1.33* 1.49*  CALCIUM 7.8* 7.5*  MG  --  1.6*  PHOS  --  4.7*  GLUCOSE 102* 84   NUTRITION - FOCUSED PHYSICAL EXAM:   Most Recent Value  Orbital Region  No depletion  Upper Arm Region  No depletion  Thoracic and Lumbar Region  Unable to assess  Buccal Region  No depletion  Temple Region  No depletion  Clavicle Bone Region  No depletion  Clavicle and Acromion Bone Region  No depletion  Scapular Bone Region  Unable to assess  Dorsal Hand  No depletion  Patellar Region  No depletion  Anterior Thigh Region  No depletion  Posterior Calf Region  No depletion  Edema (RD Assessment)  None     Diet Order:  Diet 2 gram sodium Room service appropriate? Yes; Fluid consistency: Thin  EDUCATION NEEDS:  No education needs have been identified at this time  Skin:  Blister/healing cellulitis to RLE.   Last BM:  Unknown  Height:  Ht Readings from Last 1 Encounters:  07/10/17 '5\' 6"'  (1.676 m)   Weight:  Wt Readings from Last 1 Encounters:  07/11/17 147 lb 7.8 oz (66.9 kg)   Wt Readings from Last 10 Encounters:  07/11/17 147 lb 7.8 oz (66.9 kg)  06/26/17 158 lb 15.2 oz (72.1 kg)  02/02/17 146 lb 8 oz (66.5 kg)  01/19/17 150 lb 6.4 oz (68.2 kg)  08/12/16 147 lb 12.8 oz (67 kg)  05/13/16 161 lb (73 kg)   02/07/16 168 lb (76.2 kg)  01/29/16 168 lb 9.6 oz (76.5 kg)  12/25/15 156 lb 6.4 oz (70.9 kg)  10/31/15 172 lb (78 kg)   Ideal Body Weight:  64.55 kg  BMI:  Body mass index is 23.81 kg/m.  Estimated Nutritional Needs:  Kcal:  2000-2200 kcals (30-33 kcal/kg bw) Protein:  94-107g Pro (1.4-1.6g/kg bw) Fluid:  >2 Liters (30 ml/kg bw)  Burtis Junes RD, LDN, CNSC Clinical Nutrition Pager: [phone] 07/11/2017 1:49 PM

## 2017-07-12 ENCOUNTER — Other Ambulatory Visit (HOSPITAL_COMMUNITY): Payer: Self-pay

## 2017-07-12 ENCOUNTER — Inpatient Hospital Stay (HOSPITAL_COMMUNITY): Payer: BC Managed Care – PPO

## 2017-07-12 DIAGNOSIS — I361 Nonrheumatic tricuspid (valve) insufficiency: Secondary | ICD-10-CM

## 2017-07-12 LAB — ECHOCARDIOGRAM COMPLETE
Height: 66 in
Weight: 2359.8 oz

## 2017-07-12 LAB — MRSA PCR SCREENING: MRSA by PCR: NEGATIVE

## 2017-07-12 MED ORDER — CLARITHROMYCIN 500 MG PO TABS: 500.0000 mg | ORAL_TABLET | Freq: Two times a day (BID) | ORAL | Status: DC

## 2017-07-12 MED ORDER — SODIUM CHLORIDE 0.9 % IV SOLN
INTRAVENOUS | Status: DC
  Administered 2017-07-12 – 2017-07-14 (×3): via INTRAVENOUS

## 2017-07-12 NOTE — Progress Notes (Signed)
OT Cancellation Note  Patient Details Name: Mark [name] MRN: [id number] DOB: 1955-10-19   Cancelled Treatment:    Reason Eval/Treat Not Completed: Other (comment). Pt reports he just got back in bed from going to bathroom and is all covered up to take a nap and really does not want to get up right now. Will re-attempt in AM.  Almon Register [phone] 07/12/2017, 2:40 PM

## 2017-07-12 NOTE — Progress Notes (Signed)
PROGRESS NOTE    Mark [name]  QBH:[id number] DOB: Dec 22, 1955 DOA: 07/10/2017 PCP: [name], MD  Outpatient Specialists: Dr. Constance Holster, ENT    Brief Narrative:  Mark Downs Johnsonis a 62 y.o.malewith medical history significant  for but not limited to advancedalcoholic cirrhosis, ongoing alcohol abuse, history of esophageal varices,coronary artery disease with prior MI, Thrombocytopenia,severe hypoalbuminemia presenting 1 day history of generalized weakness and cough associated with fever, leukopenia, tachycardia, tachypnea elevated lactic acid level.  X-ray of the chest was negative for any acute infiltrate.    Assessment & Plan:   Active Problems:   Cirrhosis, alcoholic ([name])   Acute on chronic renal failure ([name])   Hyponatremia   Thrombocytopenia ([name])   Acute on chronic anemia   SIRS (systemic inflammatory response syndrome) ([name])  #1 systemic inflammatory response syndrome: Influenza A positive-Tamiflu initiated Follow-up pancultures Chest x-ray negative for airspace disease Continue antibiotics #2 pancytopenia: Due to CLD - follow clinically  #3 acute on chronic kidney injury: Gentle hydration Monitor renal function and electrolytes  #4 history of alcohol abuse: Denies any recent alcohol use Monitor for withdrawal   DVT prophylaxis: SCD Code Status: (Full Family Communication:  Disposition Plan: To be determined-may need placement   Consultants:     Procedures:  Antimicrobials: Zosyn  Subjective: Patient feels better today, denies any fever or chills, no nausea vomiting, no abdominal pains. Episode of tachycardia followed by SVT since they noted.  Still tachycardic and we will go up on his fluids  Objective: Vitals:   07/11/17 2350 07/12/17 0340 07/12/17 0500 07/12/17 0805  BP: 121/89 123/89    Pulse: (!) 132 (!) 125  (!) 123  Resp:  (!) 21  19  Temp: 98.1 F (36.7 C) 98.1 F (36.7 C) 97.9 F (36.6 C) (!) 97.3 F (36.3 C)    TempSrc: Oral Oral Oral Oral  SpO2: 99% 100%  100%  Weight:      Height:        Intake/Output Summary (Last 24 hours) at 07/12/2017 1030 Last data filed at 07/12/2017 0100 Gross per 24 hour  Intake 750 ml  Output -  Net 750 ml   Filed Weights   07/10/17 2006 07/11/17 1335  Weight: 67.1 kg (148 lb) 66.9 kg (147 lb 7.8 oz)    Examination:  General exam: A chronically ill looking, no acute distress, mucous membranes slightly dry Respiratory system: Clear to auscultation. Respiratory effort normal. Cardiovascular system: Mild tachycardic, regular, no murmur, no pedal edema. Gastrointestinal system: Abdomen is nondistended, soft and nontender. No organomegaly or masses felt. Normal bowel sounds heard. Central nervous system: Alert and oriented. No focal neurological deficits. Extremities: Symmetric 5 x 5 power. Skin: Right ankle ulceration mild erythema noted without drainage Psychiatry: Judgement and insight appear normal. Mood & affect appropriate.     Data Reviewed: I have personally reviewed following labs and imaging studies  CBC: Recent Labs  Lab 07/09/17 1529 07/10/17 2018 07/11/17 0208  WBC 2.9* 2.3* 2.5*  NEUTROABS 1.9 1.6*  --   HGB 7.5 Repeated and verified X2.* 7.9* 7.5*  HCT 22.0 Repeated and verified X2.* 23.4* 22.5*  MCV 98.6 97.5 96.6  PLT 73.0* 69* 72*   Basic Metabolic Panel: Recent Labs  Lab 07/10/17 2018 07/11/17 0208  NA 131* 131*  K 4.4 4.6  CL 108 107  CO2 18* 17*  GLUCOSE 102* 84  BUN 11 11  CREATININE 1.33* 1.49*  CALCIUM 7.8* 7.5*  MG  --  1.6*  PHOS  --  4.7*   GFR: Estimated Creatinine Clearance: 45.8 mL/min (A) (by C-G formula based on SCr of 1.49 mg/dL (H)). Liver Function Tests: Recent Labs  Lab 07/10/17 2018 07/11/17 0208  AST 33 30  ALT 17 16*  ALKPHOS 111 102  BILITOT 0.9 1.0  PROT 7.6 7.3  ALBUMIN 1.7* 1.7*   No results for input(s): LIPASE, AMYLASE in the last 168 hours. No results for input(s): AMMONIA in the  last 168 hours. Coagulation Profile: Recent Labs  Lab 07/10/17 2018  INR 1.40   Cardiac Enzymes: No results for input(s): CKTOTAL, CKMB, CKMBINDEX, TROPONINI in the last 168 hours. BNP (last 3 results) No results for input(s): PROBNP in the last 8760 hours. HbA1C: No results for input(s): HGBA1C in the last 72 hours. CBG: Recent Labs  Lab 07/11/17 1306  GLUCAP 91   Lipid Profile: No results for input(s): CHOL, HDL, LDLCALC, TRIG, CHOLHDL, LDLDIRECT in the last 72 hours. Thyroid Function Tests: Recent Labs    07/11/17 0208  TSH 1.809   Anemia Panel: No results for input(s): VITAMINB12, FOLATE, FERRITIN, TIBC, IRON, RETICCTPCT in the last 72 hours. Urine analysis:    Component Value Date/Time   COLORURINE AMBER (A) 07/10/2017 2246   APPEARANCEUR CLEAR 07/10/2017 2246   LABSPEC 1.019 07/10/2017 2246   PHURINE 5.0 07/10/2017 2246   GLUCOSEU NEGATIVE 07/10/2017 2246   HGBUR NEGATIVE 07/10/2017 2246   BILIRUBINUR NEGATIVE 07/10/2017 2246   KETONESUR NEGATIVE 07/10/2017 2246   PROTEINUR NEGATIVE 07/10/2017 2246   UROBILINOGEN 2.0 (H) 04/26/2013 0945   NITRITE NEGATIVE 07/10/2017 2246   LEUKOCYTESUR NEGATIVE 07/10/2017 2246   Sepsis Labs: @LABRCNTIP (procalcitonin:4,lacticidven:4)  ) Recent Results (from the past 240 hour(s))  Culture, blood (Routine x 2)     Status: None (Preliminary result)   Collection Time: 07/10/17  8:18 PM  Result Value Ref Range Status   Specimen Description BLOOD RIGHT ANTECUBITAL  Final   Special Requests   Final    BOTTLES DRAWN AEROBIC AND ANAEROBIC Blood Culture adequate volume   Culture   Final    NO GROWTH < 24 HOURS Performed at [facility], [place] [address], [place], [place] [address]    Report Status PENDING  Incomplete  Culture, blood (Routine x 2)     Status: None (Preliminary result)   Collection Time: 07/10/17  9:35 PM  Result Value Ref Range Status   Specimen Description BLOOD LEFT HAND  Final   Special Requests IN  PEDIATRIC BOTTLE Blood Culture adequate volume  Final   Culture   Final    NO GROWTH < 24 HOURS Performed at [facility], [place] [address], [place], [place] [address]    Report Status PENDING  Incomplete  Respiratory Panel by PCR     Status: None   Collection Time: 07/11/17  3:51 AM  Result Value Ref Range Status   Adenovirus NOT DETECTED NOT DETECTED Final   Coronavirus 229E NOT DETECTED NOT DETECTED Final   Coronavirus HKU1 NOT DETECTED NOT DETECTED Final   Coronavirus NL63 NOT DETECTED NOT DETECTED Final   Coronavirus OC43 NOT DETECTED NOT DETECTED Final   Metapneumovirus NOT DETECTED NOT DETECTED Final   Rhinovirus / Enterovirus NOT DETECTED NOT DETECTED Final   Influenza A NOT DETECTED NOT DETECTED Final   Influenza B NOT DETECTED NOT DETECTED Final   Parainfluenza Virus 1 NOT DETECTED NOT DETECTED Final   Parainfluenza Virus 2 NOT DETECTED NOT DETECTED Final   Parainfluenza Virus 3 NOT DETECTED NOT DETECTED  Final   Parainfluenza Virus 4 NOT DETECTED NOT DETECTED Final   Respiratory Syncytial Virus NOT DETECTED NOT DETECTED Final   Bordetella pertussis NOT DETECTED NOT DETECTED Final   Chlamydophila pneumoniae NOT DETECTED NOT DETECTED Final   Mycoplasma pneumoniae NOT DETECTED NOT DETECTED Final  MRSA PCR Screening     Status: None   Collection Time: 07/12/17 12:20 AM  Result Value Ref Range Status   MRSA by PCR NEGATIVE NEGATIVE Final    Comment:        The GeneXpert MRSA Assay (FDA approved for NASAL specimens only), is one component of a comprehensive MRSA colonization surveillance program. It is not intended to diagnose MRSA infection nor to guide or monitor treatment for MRSA infections. Performed at [facility], [place] [address], [place], [place] [address]          Radiology Studies: Dg Chest 2 View  Result Date: 07/10/2017 CLINICAL DATA:  Dry cough since yesterday EXAM: CHEST - 2 VIEW COMPARISON:  06/17/2017 FINDINGS: The heart size and  mediastinal contours are within normal limits. Both lungs are clear. The visualized skeletal structures are unremarkable. IMPRESSION: No active cardiopulmonary disease. Electronically Signed   By: Ashley Royalty M.D.   On: 07/10/2017 20:55        Scheduled Meds: . clarithromycin  500 mg Oral Q12H  . feeding supplement (ENSURE ENLIVE)  237 mL Oral TID BM  . folic acid  1 mg Oral Daily  . guaiFENesin  600 mg Oral BID  . lactulose  30 g Oral TID  . multivitamin with minerals  1 tablet Oral Daily  . nadolol  20 mg Oral Daily  . oseltamivir  30 mg Oral BID  . pantoprazole  40 mg Oral BID  . rifaximin  550 mg Oral BID  . thiamine  100 mg Oral Daily   Continuous Infusions: . piperacillin-tazobactam (ZOSYN)  IV 3.375 g (07/12/17 0639)  . vancomycin Stopped (07/11/17 2253)     LOS: 1 day    Time spent: 59 minutes   OSEI-BONSU,Arieana Somoza, MD [facility] Pager [phone]  If 7PM-7AM, please contact night-coverage [email or web address] Password [email or web address] 07/12/2017, 10:30 AM

## 2017-07-12 NOTE — Plan of Care (Signed)
  Progressing Education: Knowledge of General Education information will improve 07/12/2017 1151 - Progressing by Kaeden Depaz, Vertell Limber, RN Health Behavior/Discharge Planning: Ability to manage health-related needs will improve 07/12/2017 1151 - Progressing by Citlali Gautney, Vertell Limber, RN Clinical Measurements: Ability to maintain clinical measurements within normal limits will improve 07/12/2017 1151 - Progressing by Deaun Rocha, Vertell Limber, RN Will remain free from infection 07/12/2017 1151 - Progressing by Evalee Jefferson, RN Diagnostic test results will improve 07/12/2017 1151 - Progressing by Evalee Jefferson, RN Respiratory complications will improve 07/12/2017 1151 - Progressing by Evalee Jefferson, RN Cardiovascular complication will be avoided 07/12/2017 1151 - Progressing by Evalee Jefferson, RN Activity: Risk for activity intolerance will decrease 07/12/2017 1151 - Progressing by Evalee Jefferson, RN Nutrition: Adequate nutrition will be maintained 07/12/2017 1151 - Progressing by Evalee Jefferson, RN Coping: Level of anxiety will decrease 07/12/2017 1151 - Progressing by Evalee Jefferson, RN Elimination: Will not experience complications related to bowel motility 07/12/2017 1151 - Progressing by Evalee Jefferson, RN Will not experience complications related to urinary retention 07/12/2017 1151 - Progressing by Eliseo Withers, Vertell Limber, RN Pain Managment: General experience of comfort will improve 07/12/2017 1151 - Progressing by Evalee Jefferson, RN Safety: Ability to remain free from injury will improve 07/12/2017 1151 - Progressing by Evalee Jefferson, RN Skin Integrity: Risk for impaired skin integrity will decrease 07/12/2017 1151 - Progressing by Gillian Meeuwsen, Vertell Limber, RN

## 2017-07-12 NOTE — Plan of Care (Signed)
  Progressing Safety: Ability to remain free from injury will improve 07/12/2017 0139 - Progressing by Verne Grain, RN   No Outcome Clinical Measurements: Respiratory complications will improve 07/12/2017 0139 by Verne Grain, RN Note Encouraged patient to cough and deep breath to clear airways a few times hourly.  Encouraged patient to turn from side to side and cough. Activity: Risk for activity intolerance will decrease 07/12/2017 0139 by Verne Grain, RN Note Encouraged to turn self in bed but be aware of lines for monitoring.    No Outcome Clinical Measurements: Respiratory complications will improve 07/12/2017 0139 by Verne Grain, RN Note Encouraged patient to cough and deep breath to clear airways a few times hourly.  Encouraged patient to turn from side to side and cough. Activity: Risk for activity intolerance will decrease 07/12/2017 0139 by Verne Grain, RN Note Encouraged to turn self in bed but be aware of lines for monitoring.

## 2017-07-12 NOTE — Progress Notes (Signed)
  Echocardiogram 2D Echocardiogram has been performed.  Mark [name] 07/12/2017, 3:29 PM

## 2017-07-13 ENCOUNTER — Telehealth: Payer: Self-pay | Admitting: Family Medicine

## 2017-07-13 MED ORDER — ACETAMINOPHEN 500 MG PO TABS: 500.0000 mg | ORAL_TABLET | Freq: Four times a day (QID) | ORAL | Status: DC | PRN

## 2017-07-13 MED ORDER — MAGNESIUM SULFATE 2 GM/50ML IV SOLN
2.0000 g | Freq: Once | INTRAVENOUS | Status: AC
  Administered 2017-07-13: 2 g via INTRAVENOUS
  Filled 2017-07-13: qty 50

## 2017-07-13 MED ORDER — GUAIFENESIN ER 600 MG PO TB12: 600.0000 mg | ORAL_TABLET | Freq: Two times a day (BID) | ORAL | Status: DC | PRN

## 2017-07-13 MED ORDER — ACETAMINOPHEN 650 MG RE SUPP
650.0000 mg | Freq: Four times a day (QID) | RECTAL | Status: DC | PRN
Start: 2017-07-13 — End: 2017-07-14

## 2017-07-13 NOTE — Progress Notes (Signed)
Few crackles noted in base of right lung, patient denies SOB.  RN paged C. Bodenheimer, NP to inquire if IVF rate can be decreased, awaiting response.  Patient has not been urinating in urinal.  RN educated patient about need to urinate in urinal to measure output, patient voiced understanding.  RN will continue to monitor patient and report any changes to provider as needed.  P.J. Linus Mako, RN

## 2017-07-13 NOTE — Telephone Encounter (Signed)
Copied from [place] [id number]. Topic: Inquiry >> Jul 13, 2017 11:22 AM Pricilla Handler wrote: Nicolette with [facility] [phone]) called requesting verbal orders for home health skilled nursing for disease management and education. Nicolette has requested: 1 visit x 1 week, 2 Visits x 2 weeks and 1 Visit x 2 weeks and 3 PRN visits. As well she will be doing a PT evaluation. Call back number [phone].       Thank You!!!

## 2017-07-13 NOTE — Progress Notes (Signed)
PT Cancellation Note  Patient Details Name: Mark [name] MRN: [id number] DOB: 1956/04/22   Cancelled Treatment:    Reason Eval/Treat Not Completed: Fatigue/lethargy limiting ability to participate. Pt sleeping soundly on arrival. Able to wake pt after multiple attempts. Pt very groggy and declining participation in therapy. Pt returned to sleeping before therapist exited room. Will re-attempt at later date/time.   Lorriane Shire 07/13/2017, 11:13 AM   Lorrin Goodell, PT  Office # [phone] Pager [phone]

## 2017-07-13 NOTE — Telephone Encounter (Signed)
Ok to give verbal orders? Please advise  

## 2017-07-13 NOTE — Progress Notes (Signed)
[facility] TEAM 1 - Stepdown/ICU TEAM  Mark [name]  VZC:[id number] DOB: 09-17-1955 DOA: 07/10/2017 PCP: [name], MD    Brief Narrative:  62 y.o.malewith a history of advancedalcoholic cirrhosis, ongoing alcohol abuse, esophageal varices, CAD, thrombocytopenia,and severe hypoalbuminemiawho presented w/ a 1 day history of generalized weakness and cough associated with fever, leukopenia, tachycardia, tachypnea elevated lactic acid level. X-ray of the chest was negative for any acute infiltrate.  Significant Events: 3/15 admit 3/17 TTE - EF 60-65% - no WMA  Subjective: Resting comfortably in bed.  Denies cp, sob, n/v, or abdom pain.  Reports that he is feeling much better overall.    Assessment & Plan:  Sepsis due to Influenza A Stop other abx - cont to hydrate - mobilize - complete 5 days of tamiflu   Pancytopenia  Due to chronic liver disease   Acute kidney injury on CKD Cont to hydrate and f/u in AM  Recent Labs  Lab 07/10/17 2018 07/11/17 0208  CREATININE 1.33* 1.49*    EtOH abuse - alcoholic cirrhosis - esophageal varices  Cont to counsel on absolute need to abstain from EtOH  Hypomagnesemia  supplemente and f/u in AM   DVT prophylaxis: SCDs Code Status: FULL CODE Family Communication: no family present at time of exam  Disposition Plan: PT/OT - possible d/c next 24-48hrs  Consultants:  none  Antimicrobials:  Zosyn 3/15 > 3/18 Vanc 3/15 > 3/18 Clarithromycin 3/15 > Tamiflu 3/16 >  Objective: Blood pressure 139/71, pulse 64, temperature 98.5 F (36.9 C), temperature source Oral, resp. rate 17, height 5\' 6"  (1.676 m), weight 66.9 kg (147 lb 7.8 oz), SpO2 100 %.  Intake/Output Summary (Last 24 hours) at 07/13/2017 1519 Last data filed at 07/13/2017 1355 Gross per 24 hour  Intake 350 ml  Output -  Net 350 ml   Filed Weights   07/10/17 2006 07/11/17 1335  Weight: 67.1 kg (148 lb) 66.9 kg (147 lb 7.8 oz)    Examination: General: No  acute respiratory distress Lungs: Clear to auscultation bilaterally without wheezes or crackles Cardiovascular: Regular rate and rhythm without murmur gallop or rub normal S1 and S2 Abdomen: Nontender, nondistended, soft, bowel sounds positive, no rebound, no ascites, no appreciable mass Extremities: No significant cyanosis, clubbing, or edema bilateral lower extremities  CBC: Recent Labs  Lab 07/09/17 1529 07/10/17 2018 07/11/17 0208  WBC 2.9* 2.3* 2.5*  NEUTROABS 1.9 1.6*  --   HGB 7.5 Repeated and verified X2.* 7.9* 7.5*  HCT 22.0 Repeated and verified X2.* 23.4* 22.5*  MCV 98.6 97.5 96.6  PLT 73.0* 69* 72*   Basic Metabolic Panel: Recent Labs  Lab 07/10/17 2018 07/11/17 0208  NA 131* 131*  K 4.4 4.6  CL 108 107  CO2 18* 17*  GLUCOSE 102* 84  BUN 11 11  CREATININE 1.33* 1.49*  CALCIUM 7.8* 7.5*  MG  --  1.6*  PHOS  --  4.7*   GFR: Estimated Creatinine Clearance: 45.8 mL/min (A) (by C-G formula based on SCr of 1.49 mg/dL (H)).  Liver Function Tests: Recent Labs  Lab 07/10/17 2018 07/11/17 0208  AST 33 30  ALT 17 16*  ALKPHOS 111 102  BILITOT 0.9 1.0  PROT 7.6 7.3  ALBUMIN 1.7* 1.7*   No results for input(s): AMMONIA in the last 168 hours.  Coagulation Profile: Recent Labs  Lab 07/10/17 2018  INR 1.40   HbA1C: Hgb A1c MFr Bld  Date/Time Value Ref Range Status  11/01/2009 04:55 PM  <  5.7 % Final   5.1 (NOTE)                                                                       According to the ADA Clinical Practice Recommendations for 2011, when HbA1c is used as a screening test:   >=6.5%   Diagnostic of Diabetes Mellitus           (if abnormal result  is confirmed)  5.7-6.4%   Increased risk of developing Diabetes Mellitus  References:Diagnosis and Classification of Diabetes Mellitus,Diabetes AOZH,0865,78(IONGE 1):S62-S69 and Standards of Medical Care in         Diabetes - 2011,Diabetes XBMW,4132,44  (Suppl 1):S11-S61.    CBG: Recent Labs  Lab  07/11/17 1306  GLUCAP 91    Recent Results (from the past 240 hour(s))  Culture, blood (Routine x 2)     Status: None (Preliminary result)   Collection Time: 07/10/17  8:18 PM  Result Value Ref Range Status   Specimen Description BLOOD RIGHT ANTECUBITAL  Final   Special Requests   Final    BOTTLES DRAWN AEROBIC AND ANAEROBIC Blood Culture adequate volume   Culture   Final    NO GROWTH 3 DAYS Performed at [facility], [address], [place], [place] [address]    Report Status PENDING  Incomplete  Culture, blood (Routine x 2)     Status: None (Preliminary result)   Collection Time: 07/10/17  9:35 PM  Result Value Ref Range Status   Specimen Description BLOOD LEFT HAND  Final   Special Requests IN PEDIATRIC BOTTLE Blood Culture adequate volume  Final   Culture   Final    NO GROWTH 3 DAYS Performed at [facility], [place] [address], [place], [place] [address]    Report Status PENDING  Incomplete  Respiratory Panel by PCR     Status: None   Collection Time: 07/11/17  3:51 AM  Result Value Ref Range Status   Adenovirus NOT DETECTED NOT DETECTED Final   Coronavirus 229E NOT DETECTED NOT DETECTED Final   Coronavirus HKU1 NOT DETECTED NOT DETECTED Final   Coronavirus NL63 NOT DETECTED NOT DETECTED Final   Coronavirus OC43 NOT DETECTED NOT DETECTED Final   Metapneumovirus NOT DETECTED NOT DETECTED Final   Rhinovirus / Enterovirus NOT DETECTED NOT DETECTED Final   Influenza A NOT DETECTED NOT DETECTED Final   Influenza B NOT DETECTED NOT DETECTED Final   Parainfluenza Virus 1 NOT DETECTED NOT DETECTED Final   Parainfluenza Virus 2 NOT DETECTED NOT DETECTED Final   Parainfluenza Virus 3 NOT DETECTED NOT DETECTED Final   Parainfluenza Virus 4 NOT DETECTED NOT DETECTED Final   Respiratory Syncytial Virus NOT DETECTED NOT DETECTED Final   Bordetella pertussis NOT DETECTED NOT DETECTED Final   Chlamydophila pneumoniae NOT DETECTED NOT DETECTED Final   Mycoplasma pneumoniae  NOT DETECTED NOT DETECTED Final  MRSA PCR Screening     Status: None   Collection Time: 07/12/17 12:20 AM  Result Value Ref Range Status   MRSA by PCR NEGATIVE NEGATIVE Final    Comment:        The GeneXpert MRSA Assay (FDA approved for NASAL specimens only), is one component of a comprehensive MRSA colonization  surveillance program. It is not intended to diagnose MRSA infection nor to guide or monitor treatment for MRSA infections. Performed at [facility], [place] [address], [place], [place] [address]      Scheduled Meds: . feeding supplement (ENSURE ENLIVE)  237 mL Oral TID BM  . folic acid  1 mg Oral Daily  . guaiFENesin  600 mg Oral BID  . lactulose  30 g Oral TID  . multivitamin with minerals  1 tablet Oral Daily  . nadolol  20 mg Oral Daily  . oseltamivir  30 mg Oral BID  . pantoprazole  40 mg Oral BID  . rifaximin  550 mg Oral BID  . thiamine  100 mg Oral Daily     LOS: 2 days   Cherene Altes, MD [facility] Office  [phone] Pager - Text Page per Amion as per below:  On-Call/Text Page:      [email or web address]      password [email or web address]  If 7PM-7AM, please contact night-coverage [email or web address] Password [facility] 07/13/2017, 3:19 PM

## 2017-07-13 NOTE — Evaluation (Addendum)
Occupational Therapy Evaluation Patient Details Name: Mark [name] MRN: [id number] DOB: August 21, 1955 Today's Date: 07/13/2017    History of Present Illness 62 y.o. male with a history of advanced alcoholic cirrhosis, ongoing alcohol abuse, esophageal varices, CAD, thrombocytopenia, and severe hypoalbuminemia who presented w/ a 1 day history of generalized weakness and cough associated with fever, leukopenia, tachycardia, tachypnea elevated lactic acid level.  X-ray of the chest was negative for any acute infiltrate.   Clinical Impression   PTA, pt reports that his wife assists him with all ADL tasks. He reports that he was ambulating without assistance until he became ill. Pt currently demonstrates poor safety awareness, decreased balance, and poor activity tolerance for ADL impacting his ability to participate in daily self-care tasks. He requires mod assist for LB ADL and min assist for toilet transfers. Pt reports strong desire to return home and adamantly declines SNF rehabilitation. He will need 24 hour assistance post-acute D/C. Recommend home health OT follow-up post-acute D/C and will continue to follow while admitted.     Follow Up Recommendations  Home health OT;Supervision/Assistance - 24 hour(pt adamantly declines SNF)    Equipment Recommendations  None recommended by OT    Recommendations for Other Services       Precautions / Restrictions Precautions Precautions: Fall Restrictions Weight Bearing Restrictions: No      Mobility Bed Mobility Overal bed mobility: Needs Assistance Bed Mobility: Sit to Supine     Supine to sit: Supervision Sit to supine: Supervision   General bed mobility comments: Supervision for safety.   Transfers Overall transfer level: Needs assistance Equipment used: Rolling walker (2 wheeled) Transfers: Sit to/from Stand Sit to Stand: Min assist Stand pivot transfers: Min assist       General transfer comment: Min assist to power  up and maintain balance. Requires UE support throughout.     Balance Overall balance assessment: Needs assistance Sitting-balance support: No upper extremity supported Sitting balance-[name]: Fair     Standing balance support: Bilateral upper extremity supported;Single extremity supported;During functional activity Standing balance-[name]: Poor Standing balance comment: Reuires UE support.                            ADL either performed or assessed with clinical judgement   ADL Overall ADL's : Needs assistance/impaired Eating/Feeding: Set up;Sitting   Grooming: Minimal assistance;Standing   Upper Body Bathing: Min guard;Sitting   Lower Body Bathing: Moderate assistance;Sit to/from stand   Upper Body Dressing : Minimal assistance;Sitting   Lower Body Dressing: Moderate assistance;Sit to/from stand   Toilet Transfer: Minimal assistance;Ambulation;Comfort height toilet;Grab bars Toilet Transfer Details (indicate cue type and reason): Pt unsafe and holding on to sink and refused to use RW. Min and up to mod assist without assistive devices.  Toileting- Clothing Manipulation and Hygiene: Min guard;Sitting/lateral lean       Functional mobility during ADLs: Minimal assistance General ADL Comments: Pt refused to use RW during session.      Vision   Additional Comments: Will continue to assess     Perception     Praxis      Pertinent Vitals/Pain Pain Assessment: No/denies pain     Hand Dominance     Extremity/Trunk Assessment Upper Extremity Assessment Upper Extremity Assessment: Generalized weakness   Lower Extremity Assessment Lower Extremity Assessment: Generalized weakness       Communication Communication Communication: Other (comment)   Cognition Arousal/Alertness: Awake/alert Behavior During Therapy: WFL for  tasks assessed/performed Overall Cognitive Status: Impaired/Different from baseline Area of Impairment: Attention;Following  commands;Awareness                   Current Attention Level: Sustained Memory: Decreased recall of precautions;Decreased short-term memory Following Commands: Follows one step commands with increased time Safety/Judgement: Decreased awareness of safety;Decreased awareness of deficits Awareness: Intellectual Problem Solving: Slow processing;Decreased initiation;Difficulty sequencing;Requires verbal cues;Requires tactile cues General Comments: Pt with poor understanding of impact of mobility deficits on functionality.    General Comments  Pt appearing agitated at times. Agreeable to ambulation to bathroom for toileting tasks and participation with basic ADL.     Exercises     Shoulder Instructions      Home Living Family/patient expects to be discharged to:: Private residence Living Arrangements: Spouse/significant other Available Help at Discharge: Family Type of Home: House Home Access: Stairs to enter Technical brewer of Steps: 2 Entrance Stairs-Rails: None Home Layout: One level     Bathroom Shower/Tub: Tub only   Biochemist, clinical: Standard     Home Equipment: Environmental consultant - 2 wheels;Cane - single point          Prior Functioning/Environment Level of Independence: Needs assistance  Gait / Transfers Assistance Needed: Reports he was ambulating independently before becoming ill. ADL's / Homemaking Assistance Needed: Reports wife assists with all ADL.            OT Problem List: Decreased strength;Decreased range of motion;Decreased activity tolerance;Impaired balance (sitting and/or standing);Impaired vision/perception;Decreased safety awareness;Decreased knowledge of use of DME or AE;Decreased knowledge of precautions;Decreased cognition;Cardiopulmonary status limiting activity      OT Treatment/Interventions: DME and/or AE instruction;Therapeutic activities;Self-care/ADL training;Therapeutic exercise;Energy conservation;Cognitive  remediation/compensation;Visual/perceptual remediation/compensation;Patient/family education;Balance training    OT Goals(Current goals can be found in the care plan section) Acute Rehab OT Goals Patient Stated Goal: to go home OT Goal Formulation: With patient Time For Goal Achievement: 07/27/17 Potential to Achieve Goals: Fair ADL Goals Pt Will Perform Grooming: with supervision;standing Pt Will Perform Lower Body Dressing: with supervision;sit to/from stand Pt Will Transfer to Toilet: with supervision;ambulating;bedside commode Pt Will Perform Toileting - Clothing Manipulation and hygiene: with supervision;sit to/from stand Additional ADL Goal #1: Pt will demonstrate emergent awareness during grooming tasks.  OT Frequency: Min 2X/week   Barriers to D/C:            Co-evaluation              AM-PAC PT "6 Clicks" Daily Activity     Outcome Measure Help from another person eating meals?: A Little Help from another person taking care of personal grooming?: A Little Help from another person toileting, which includes using toliet, bedpan, or urinal?: A Little Help from another person bathing (including washing, rinsing, drying)?: A Lot Help from another person to put on and taking off regular upper body clothing?: A Little Help from another person to put on and taking off regular lower body clothing?: A Lot 6 Click Score: 16   End of Session Equipment Utilized During Treatment: Gait belt(refused to use RW) Nurse Communication: Mobility status  Activity Tolerance: Patient tolerated treatment well Patient left: in bed;with call bell/phone within reach;with bed alarm set  OT Visit Diagnosis: Unsteadiness on feet (R26.81);Other abnormalities of gait and mobility (R26.89);Other symptoms and signs involving cognitive function;Muscle weakness (generalized) (M62.81)                Time: 1610-9604 OT Time Calculation (min): 39 min Charges:  OT General Charges $OT Visit:  1  Visit OT Evaluation $OT Eval Moderate Complexity: 1 Mod OT Treatments $Self Care/Home Management : 23-37 mins G-Codes:     Norman Herrlich, MS OTR/L  Pager: [place] A Subrina Vecchiarelli 07/13/2017, 5:13 PM

## 2017-07-13 NOTE — Progress Notes (Signed)
Patient was transferred to telemetry level of care per MD order. Will continue to monitor.

## 2017-07-14 ENCOUNTER — Other Ambulatory Visit: Payer: Self-pay | Admitting: Physician Assistant

## 2017-07-14 DIAGNOSIS — K7030 Alcoholic cirrhosis of liver without ascites: Secondary | ICD-10-CM

## 2017-07-14 DIAGNOSIS — D696 Thrombocytopenia, unspecified: Secondary | ICD-10-CM

## 2017-07-14 DIAGNOSIS — E871 Hypo-osmolality and hyponatremia: Secondary | ICD-10-CM

## 2017-07-14 LAB — COMPREHENSIVE METABOLIC PANEL
ALT: 15 U/L — ABNORMAL LOW (ref 17–63)
AST: 35 U/L (ref 15–41)
Albumin: 1.5 g/dL — ABNORMAL LOW (ref 3.5–5.0)
Alkaline Phosphatase: 98 U/L (ref 38–126)
Anion gap: 7 (ref 5–15)
BUN: <5 mg/dL — ABNORMAL LOW (ref 6–20)
CO2: 17 mmol/L — ABNORMAL LOW (ref 22–32)
Calcium: 7.4 mg/dL — ABNORMAL LOW (ref 8.9–10.3)
Chloride: 113 mmol/L — ABNORMAL HIGH (ref 101–111)
Creatinine, Ser: 0.93 mg/dL (ref 0.61–1.24)
GFR calc Af Amer: >60 mL/min (ref >60)
GFR calc non Af Amer: >60 mL/min (ref >60)
Glucose, Bld: 84 mg/dL (ref 65–99)
Potassium: 3.8 mmol/L (ref 3.5–5.1)
Sodium: 137 mmol/L (ref 135–145)
Total Bilirubin: 1.1 mg/dL (ref 0.3–1.2)
Total Protein: 6.6 g/dL (ref 6.5–8.1)

## 2017-07-14 LAB — CBC
HCT: 22.8 % — ABNORMAL LOW (ref 39.0–52.0)
Hemoglobin: 7.5 g/dL — ABNORMAL LOW (ref 13.0–17.0)
MCH: 32.3 pg (ref 26.0–34.0)
MCHC: 32.9 g/dL (ref 30.0–36.0)
MCV: 98.3 fL (ref 78.0–100.0)
Platelets: 83 10*3/uL — ABNORMAL LOW (ref 150–400)
RBC: 2.32 MIL/uL — ABNORMAL LOW (ref 4.22–5.81)
RDW: 18.3 % — ABNORMAL HIGH (ref 11.5–15.5)
WBC: 2.7 10*3/uL — ABNORMAL LOW (ref 4.0–10.5)

## 2017-07-14 LAB — PROTIME-INR
INR: 1.39
Prothrombin Time: 16.9 seconds — ABNORMAL HIGH (ref 11.4–15.2)

## 2017-07-14 LAB — MAGNESIUM: Magnesium: 2 mg/dL (ref 1.7–2.4)

## 2017-07-14 MED ORDER — ALBUTEROL SULFATE HFA 108 (90 BASE) MCG/ACT IN AERS: 2.0000 | INHALATION_SPRAY | Freq: Four times a day (QID) | RESPIRATORY_TRACT | 0 refills | Status: DC | PRN

## 2017-07-14 MED ORDER — GUAIFENESIN ER 600 MG PO TB12: 600.0000 mg | ORAL_TABLET | Freq: Two times a day (BID) | ORAL | 0 refills | Status: DC | PRN

## 2017-07-14 MED ORDER — OSELTAMIVIR PHOSPHATE 30 MG PO CAPS: 30.0000 mg | ORAL_CAPSULE | Freq: Two times a day (BID) | ORAL | 0 refills | Status: AC

## 2017-07-14 NOTE — Discharge Summary (Addendum)
Physician Discharge Summary  Mark [name] LZJ:[id number] DOB: [birth year] DOA: 07/10/2017  PCP: [name], MD  Admit date: 07/10/2017 Discharge date: 07/14/2017  Admitted From:home Disposition:home  Recommendations for Outpatient Follow-up:  1. Follow up with PCP in 1-2 weeks 2. Please obtain BMP/CBC in one week 3.  Home Health:yes Equipment/Devices:none Discharge Condition:stable CODE STATUS:full code Diet recommendation:heart healthy  Brief/Interim Summary: 62 year old male with alcohol abuse, advanced alcoholic liver cirrhosis with esophageal varices, coronary artery disease, thrombocytopenia who presented with a day of generalized weakness associated with cough, fever, leukopenia, tachycardia with elevated lactic acid level.  Chest x-ray was negative for any acute infiltrates.  Patient was admitted for sepsis due to influenza A.  Treated with Tamiflu.  Antibiotics discontinued.  Echocardiogram with EF of 60% with normal wall motion abnormalities. Patient has pancytopenia likely contributed by alcoholic liver disease.  Recommend outpatient follow-up. Acute kidney injury improved on discharge. Hypomagnesemia resolved  Continue home medication.  Ordered Mucinex and albuterol inhaler as needed.  Prescription for Tamiflu provided to complete the course.  Resume home medication.  Stable on discharge.  Recommended outpatient follow-up.  Home care services ordered.  Discussed with the case manager.    Discharge Diagnoses:  Active Problems:   Cirrhosis, alcoholic ([name])   Acute kidney disease   Hyponatremia   Thrombocytopenia ([name])   Acute on chronic anemia      Discharge Instructions  Discharge Instructions    Call MD for:  difficulty breathing, headache or visual disturbances   Complete by:  As directed    Call MD for:  extreme fatigue   Complete by:  As directed    Call MD for:  hives   Complete by:  As directed    Call MD for:  persistant dizziness or  light-headedness   Complete by:  As directed    Call MD for:  persistant nausea and vomiting   Complete by:  As directed    Call MD for:  severe uncontrolled pain   Complete by:  As directed    Call MD for:  temperature >100.4   Complete by:  As directed    Diet - low sodium heart healthy   Complete by:  As directed    Increase activity slowly   Complete by:  As directed      Allergies as of 07/14/2017   No Known Allergies     Medication List    STOP taking these medications   clarithromycin 500 MG tablet Commonly known as:  BIAXIN     TAKE these medications   albuterol 108 (90 Base) MCG/ACT inhaler Commonly known as:  PROVENTIL HFA;VENTOLIN HFA Inhale 2 puffs into the lungs every 6 (six) hours as needed for wheezing or shortness of breath.   folic acid 1 MG tablet Commonly known as:  FOLVITE Take 1 tablet (1 mg total) by mouth daily.   furosemide 20 MG tablet Commonly known as:  LASIX Take 20 mg by mouth 2 (two) times daily.   guaiFENesin 600 MG 12 hr tablet Commonly known as:  MUCINEX Take 1 tablet (600 mg total) by mouth 2 (two) times daily as needed for cough or to loosen phlegm.   lactulose 10 GM/15ML solution Commonly known as:  CHRONULAC Take 45 mLs (30 g total) by mouth 3 (three) times daily.   nadolol 20 MG tablet Commonly known as:  CORGARD Take 1 tablet (20 mg total) by mouth daily.   oseltamivir 30 MG capsule Commonly known as:  TAMIFLU  Take 1 capsule (30 mg total) by mouth 2 (two) times daily for 2 days.   pantoprazole 40 MG tablet Commonly known as:  PROTONIX Take 1 tablet (40 mg total) by mouth 2 (two) times daily.   rifaximin 550 MG Tabs tablet Commonly known as:  XIFAXAN Take 1 tablet (550 mg total) by mouth 2 (two) times daily.   spironolactone 50 MG tablet Commonly known as:  ALDACTONE TAKE 2 TABLETS BY MOUTH TWICE A DAY(MUST SCHEDULE APPOINTMENT FOR ADDITIONAL REFILLS) What changed:  See the new instructions.   thiamine 100 MG  tablet Take 1 tablet (100 mg total) by mouth daily.      Follow-up Information    [name], MD. Schedule an appointment as soon as possible for a visit in 1 week(s).   Specialty:  Family Medicine Contact information: [place] [address] [phone]          No Known Allergies  Consultations: None  Procedures/Studies: None  Subjective: Seen and examined at bedside.  Patient is eager to go home.  He reported feeling much better.  Still has some dry cough.  Denied shortness of breath, chest pain, nausea vomiting.  He is not requiring oxygen.  Discharge Exam: Vitals:   07/14/17 0614 07/14/17 0950  BP: (!) 158/65 (!) 151/57  Pulse: 68 62  Resp:    Temp: 98.6 F (37 C)   SpO2: 100%    Vitals:   07/13/17 1558 07/13/17 2000 07/14/17 0614 07/14/17 0950  BP: (!) 151/73 (!) 141/68 (!) 158/65 (!) 151/57  Pulse:  61 68 62  Resp: 19 16    Temp:  98.7 F (37.1 C) 98.6 F (37 C)   TempSrc:  Oral Oral   SpO2: 100% 100% 100%   Weight:      Height:        General: Pt is alert, awake, not in acute distress, pleasant male Cardiovascular: RRR, S1/S2 +, no rubs, no gallops Respiratory: CTA bilaterally, no wheezing, no rhonchi Abdominal: Soft, NT, ND, bowel sounds + Extremities: no edema, no cyanosis    The results of significant diagnostics from this hospitalization (including imaging, microbiology, ancillary and laboratory) are listed below for reference.     Microbiology: Recent Results (from the past 240 hour(s))  Culture, blood (Routine x 2)     Status: None (Preliminary result)   Collection Time: 07/10/17  8:18 PM  Result Value Ref Range Status   Specimen Description BLOOD RIGHT ANTECUBITAL  Final   Special Requests   Final    BOTTLES DRAWN AEROBIC AND ANAEROBIC Blood Culture adequate volume   Culture   Final    NO GROWTH 4 DAYS Performed at [facility], [address], [place], [place] [address]    Report Status  PENDING  Incomplete  Culture, blood (Routine x 2)     Status: None (Preliminary result)   Collection Time: 07/10/17  9:35 PM  Result Value Ref Range Status   Specimen Description BLOOD LEFT HAND  Final   Special Requests IN PEDIATRIC BOTTLE Blood Culture adequate volume  Final   Culture   Final    NO GROWTH 4 DAYS Performed at [facility], [place] [address], [place], [place] [address]    Report Status PENDING  Incomplete  Respiratory Panel by PCR     Status: None   Collection Time: 07/11/17  3:51 AM  Result Value Ref Range Status   Adenovirus NOT DETECTED NOT DETECTED Final  Coronavirus 229E NOT DETECTED NOT DETECTED Final   Coronavirus HKU1 NOT DETECTED NOT DETECTED Final   Coronavirus NL63 NOT DETECTED NOT DETECTED Final   Coronavirus OC43 NOT DETECTED NOT DETECTED Final   Metapneumovirus NOT DETECTED NOT DETECTED Final   Rhinovirus / Enterovirus NOT DETECTED NOT DETECTED Final   Influenza A NOT DETECTED NOT DETECTED Final   Influenza B NOT DETECTED NOT DETECTED Final   Parainfluenza Virus 1 NOT DETECTED NOT DETECTED Final   Parainfluenza Virus 2 NOT DETECTED NOT DETECTED Final   Parainfluenza Virus 3 NOT DETECTED NOT DETECTED Final   Parainfluenza Virus 4 NOT DETECTED NOT DETECTED Final   Respiratory Syncytial Virus NOT DETECTED NOT DETECTED Final   Bordetella pertussis NOT DETECTED NOT DETECTED Final   Chlamydophila pneumoniae NOT DETECTED NOT DETECTED Final   Mycoplasma pneumoniae NOT DETECTED NOT DETECTED Final  MRSA PCR Screening     Status: None   Collection Time: 07/12/17 12:20 AM  Result Value Ref Range Status   MRSA by PCR NEGATIVE NEGATIVE Final    Comment:        The GeneXpert MRSA Assay (FDA approved for NASAL specimens only), is one component of a comprehensive MRSA colonization surveillance program. It is not intended to diagnose MRSA infection nor to guide or monitor treatment for MRSA infections. Performed at [facility], [place] [address], [place], [place] [address]      Labs: BNP (last 3 results) No results for input(s): BNP in the last 8760 hours. Basic Metabolic Panel: Recent Labs  Lab 07/10/17 2018 07/11/17 0208 07/14/17 0542  NA 131* 131* 137  K 4.4 4.6 3.8  CL 108 107 113*  CO2 18* 17* 17*  GLUCOSE 102* 84 84  BUN 11 11 <5*  CREATININE 1.33* 1.49* 0.93  CALCIUM 7.8* 7.5* 7.4*  MG  --  1.6* 2.0  PHOS  --  4.7*  --    Liver Function Tests: Recent Labs  Lab 07/10/17 2018 07/11/17 0208 07/14/17 0542  AST 33 30 35  ALT 17 16* 15*  ALKPHOS 111 102 98  BILITOT 0.9 1.0 1.1  PROT 7.6 7.3 6.6  ALBUMIN 1.7* 1.7* 1.5*   No results for input(s): LIPASE, AMYLASE in the last 168 hours. No results for input(s): AMMONIA in the last 168 hours. CBC: Recent Labs  Lab 07/09/17 1529 07/10/17 2018 07/11/17 0208 07/14/17 0542  WBC 2.9* 2.3* 2.5* 2.7*  NEUTROABS 1.9 1.6*  --   --   HGB 7.5 Repeated and verified X2.* 7.9* 7.5* 7.5*  HCT 22.0 Repeated and verified X2.* 23.4* 22.5* 22.8*  MCV 98.6 97.5 96.6 98.3  PLT 73.0* 69* 72* 83*   Cardiac Enzymes: No results for input(s): CKTOTAL, CKMB, CKMBINDEX, TROPONINI in the last 168 hours. BNP: Invalid input(s): POCBNP CBG: Recent Labs  Lab 07/11/17 1306  GLUCAP 91   D-Dimer No results for input(s): DDIMER in the last 72 hours. Hgb A1c No results for input(s): HGBA1C in the last 72 hours. Lipid Profile No results for input(s): CHOL, HDL, LDLCALC, TRIG, CHOLHDL, LDLDIRECT in the last 72 hours. Thyroid function studies No results for input(s): TSH, T4TOTAL, T3FREE, THYROIDAB in the last 72 hours.  Invalid input(s): FREET3 Anemia work up No results for input(s): VITAMINB12, FOLATE, FERRITIN, TIBC, IRON, RETICCTPCT in the last 72 hours. Urinalysis    Component Value Date/Time   COLORURINE AMBER (A) 07/10/2017 2246   APPEARANCEUR CLEAR 07/10/2017 2246   LABSPEC 1.019 07/10/2017 2246   PHURINE 5.0 07/10/2017  [place] 07/10/2017 2246    HGBUR NEGATIVE 07/10/2017 2246   BILIRUBINUR NEGATIVE 07/10/2017 2246   KETONESUR NEGATIVE 07/10/2017 2246   PROTEINUR NEGATIVE 07/10/2017 2246   UROBILINOGEN 2.0 (H) 04/26/2013 0945   NITRITE NEGATIVE 07/10/2017 2246   LEUKOCYTESUR NEGATIVE 07/10/2017 2246   Sepsis Labs Invalid input(s): PROCALCITONIN,  WBC,  LACTICIDVEN Microbiology Recent Results (from the past 240 hour(s))  Culture, blood (Routine x 2)     Status: None (Preliminary result)   Collection Time: 07/10/17  8:18 PM  Result Value Ref Range Status   Specimen Description BLOOD RIGHT ANTECUBITAL  Final   Special Requests   Final    BOTTLES DRAWN AEROBIC AND ANAEROBIC Blood Culture adequate volume   Culture   Final    NO GROWTH 4 DAYS Performed at [facility], [place] [address] [place], [place] [address]    Report Status PENDING  Incomplete  Culture, blood (Routine x 2)     Status: None (Preliminary result)   Collection Time: 07/10/17  9:35 PM  Result Value Ref Range Status   Specimen Description BLOOD LEFT HAND  Final   Special Requests IN PEDIATRIC BOTTLE Blood Culture adequate volume  Final   Culture   Final    NO GROWTH 4 DAYS Performed at [facility], [place] [address], [place], [place] [address]    Report Status PENDING  Incomplete  Respiratory Panel by PCR     Status: None   Collection Time: 07/11/17  3:51 AM  Result Value Ref Range Status   Adenovirus NOT DETECTED NOT DETECTED Final   Coronavirus 229E NOT DETECTED NOT DETECTED Final   Coronavirus HKU1 NOT DETECTED NOT DETECTED Final   Coronavirus NL63 NOT DETECTED NOT DETECTED Final   Coronavirus OC43 NOT DETECTED NOT DETECTED Final   Metapneumovirus NOT DETECTED NOT DETECTED Final   Rhinovirus / Enterovirus NOT DETECTED NOT DETECTED Final   Influenza A NOT DETECTED NOT DETECTED Final   Influenza B NOT DETECTED NOT DETECTED Final   Parainfluenza Virus 1 NOT DETECTED NOT DETECTED Final   Parainfluenza Virus 2 NOT DETECTED NOT DETECTED Final    Parainfluenza Virus 3 NOT DETECTED NOT DETECTED Final   Parainfluenza Virus 4 NOT DETECTED NOT DETECTED Final   Respiratory Syncytial Virus NOT DETECTED NOT DETECTED Final   Bordetella pertussis NOT DETECTED NOT DETECTED Final   Chlamydophila pneumoniae NOT DETECTED NOT DETECTED Final   Mycoplasma pneumoniae NOT DETECTED NOT DETECTED Final  MRSA PCR Screening     Status: None   Collection Time: 07/12/17 12:20 AM  Result Value Ref Range Status   MRSA by PCR NEGATIVE NEGATIVE Final    Comment:        The GeneXpert MRSA Assay (FDA approved for NASAL specimens only), is one component of a comprehensive MRSA colonization surveillance program. It is not intended to diagnose MRSA infection nor to guide or monitor treatment for MRSA infections. Performed at [facility], [place] [address] [place], [place] [address]      Time coordinating discharge:  30 minutes  SIGNED:   Rosita Fire, MD  [facility] 07/14/2017, 10:23 AM  If 7PM-7AM, please contact night-coverage [email or web address] Password [email or web address]

## 2017-07-14 NOTE — Telephone Encounter (Signed)
Verbal orders given per [name].

## 2017-07-14 NOTE — Telephone Encounter (Signed)
ok 

## 2017-07-14 NOTE — Care Management Note (Signed)
Case Management Note  Patient Details  Name: Mark [name] MRN: [id number] Date of Birth: [birth year]  Subjective/Objective:     Acute on chronic anemia             PCP: Grier Mitts  Action/Plan: Transition to home with home health services ( PT,only. Pt declined RN,OT and Nurse Aide services). Wife to provide transportation to home.  Expected Discharge Date:  07/14/17               Expected Discharge Plan:  No Name  In-House Referral:     Discharge planning Services     Post Acute Care Choice:    Choice offered to:  Patient  DME Arranged:   rolling walker DME Agency:   [facility]  HH Arranged:  PT [place] Agency:  [place]  Status of Service:  Completed, signed off  If discussed at [place] of Stay Meetings, dates discussed:    Additional Comments:  Sharin Mons, RN 07/14/2017, 10:40 AM

## 2017-07-14 NOTE — Progress Notes (Signed)
Patient was discharged home with home health by MD order; discharged instructions  review and give to patient and his wife with care notes; IV DIC; rolling walker was brought by [facility] staff in his room; patient will be escorted to the car by nurse tech via wheelchair.

## 2017-07-15 ENCOUNTER — Telehealth: Payer: Self-pay | Admitting: Family Medicine

## 2017-07-15 LAB — CULTURE, BLOOD (ROUTINE X 2)
Culture: NO GROWTH
Culture: NO GROWTH
Special Requests: ADEQUATE
Special Requests: ADEQUATE

## 2017-07-15 NOTE — Telephone Encounter (Signed)
Verbal orders given per Dr. Volanda Napoleon

## 2017-07-15 NOTE — Telephone Encounter (Signed)
ok 

## 2017-07-15 NOTE — Telephone Encounter (Signed)
Copied from [place] [phone]. Topic: Quick Communication - See Telephone Encounter >> Jul 15, 2017  9:00 AM Margot Ables wrote: CRM for notification. See Telephone encounter for: 07/15/17. Requesting VO for PT to increase strength 2x week for 2 weeks, 1 x week for 1 week

## 2017-07-15 NOTE — Telephone Encounter (Signed)
Ok to give verbal orders, please advise.

## 2017-07-16 ENCOUNTER — Telehealth: Payer: Self-pay

## 2017-07-16 NOTE — Telephone Encounter (Signed)
Copied from [place]. Topic: Inquiry >> Jul 16, 2017 11:15 AM Oliver Pila B wrote: Reason for CRM: [facility] are needing verbals to change the wraps around wounds, contact [place] [phone]; can take verbal per VM

## 2017-07-17 NOTE — Telephone Encounter (Signed)
Verbal orders were left on voicemail.

## 2017-07-21 ENCOUNTER — Inpatient Hospital Stay: Payer: Self-pay | Admitting: Family Medicine

## 2017-07-22 ENCOUNTER — Telehealth: Payer: Self-pay | Admitting: Family Medicine

## 2017-07-22 ENCOUNTER — Inpatient Hospital Stay: Payer: Self-pay | Admitting: Family Medicine

## 2017-07-22 NOTE — Telephone Encounter (Signed)
Copied from [place]. Topic: Inquiry >> Jul 22, 2017 11:39 AM Oliver Pila B wrote: Reason for CRM: [facility] needing verbal orders for PT contact [facility] @ [phone]

## 2017-07-22 NOTE — Telephone Encounter (Signed)
Verbal orders given to Tillie Rung, PT per [name]

## 2017-07-24 ENCOUNTER — Encounter: Payer: Self-pay | Admitting: Family Medicine

## 2017-07-24 ENCOUNTER — Ambulatory Visit (INDEPENDENT_AMBULATORY_CARE_PROVIDER_SITE_OTHER): Payer: BC Managed Care – PPO | Admitting: Family Medicine

## 2017-07-24 VITALS — BP 104/60 | HR 78 | Temp 98.1°F | Wt 138.0 lb

## 2017-07-24 DIAGNOSIS — N529 Male erectile dysfunction, unspecified: Secondary | ICD-10-CM | POA: Diagnosis not present

## 2017-07-24 DIAGNOSIS — J09X2 Influenza due to identified novel influenza A virus with other respiratory manifestations: Secondary | ICD-10-CM

## 2017-07-24 DIAGNOSIS — K7030 Alcoholic cirrhosis of liver without ascites: Secondary | ICD-10-CM

## 2017-07-24 DIAGNOSIS — Z09 Encounter for follow-up examination after completed treatment for conditions other than malignant neoplasm: Secondary | ICD-10-CM

## 2017-07-24 NOTE — Progress Notes (Signed)
Subjective:    Patient ID: Mark [name], male    DOB: [birth year], 62 y.o.   MRN: [id number]  No chief complaint on file. Patient accompanied by his wife.  HPI Patient was seen today for hospital.  Patient was admitted from 3/15 for influenza A.  Pt was given Tamiflu.  Per pt and wife, antibiotics were d/c'd.  Pt was taking antibiotics for a right foot wound which caused sepsis for which he was previously in the hospital on 06/17/17.  Pt also had a upper GIB during that admission.    Pt states he is feeling better since being discharged from the hospital.  Pt is now using his cane to get around instead of his walker.  Pt states he feels better doing this.  Pt denies shortness of breath, fever, nausea vomiting.  Home health is still coming to the house for wound checks.  Pt endorses continued right foot edema but improvement in the wound.  Pt states he would like to have something for ED.  Pt states he tried viagra and cialis in the past but they did not work.    Past Medical History:  Diagnosis Date  . Alcohol abuse   . Anemia   . Arthritis   . Ascites   . Cirrhosis ([place])   . Heart murmur   . Hyperlipidemia   . Hypertension   . Leg swelling   . Myocardial infarction ([place]) 2012  . Stroke [facility]) 2012   no deficits  . Thrombocytopenia ([name])     No Known Allergies  ROS General: Denies fever, chills, night sweats, changes in weight, changes in appetite HEENT: Denies headaches, ear pain, changes in vision, rhinorrhea, sore throat CV: Denies CP, palpitations, SOB, orthopnea Pulm: Denies SOB, cough, wheezing GI: Denies abdominal pain, nausea, vomiting, diarrhea, constipation GU: Denies dysuria, hematuria, frequency, vaginal discharge   +ED Msk: Denies muscle cramps, joint pains Neuro: Denies weakness, numbness, tingling Skin: Denies rashes, bruising   +r foot wound/edema Psych: Denies depression, anxiety, hallucinations     Objective:    Blood pressure 104/60, pulse 78,  temperature 98.1 F (36.7 C), temperature source Oral, weight 138 lb (62.6 kg), SpO2 98 %.   Gen. Pleasant, well-nourished, in no distress, normal affect   HEENT: [place]/AT, face symmetric, conjunctiva clear, no scleral icterus, PERRLA, EOMI, nares patent without drainage, pharynx without erythema or exudate. Neck: No JVD, no thyromegaly, no carotid bruits Lungs: no accessory muscle use, CTAB, no wheezes or rales Cardiovascular: RRR, no m/r/g, R foot and ankle edema, nonpitting. Abdomen: BS present, soft, NT/ND. Neuro:  A&Ox3, CN II-XII intact, normal gait Skin:  Warm, dry.  Right foot with eschar on numerous healing wounds, no erythema, no drainage, or increased warmth noted from areas. Mild TTP on midline top of R foot.  Non pitting edema of R foot.  Dorsum of right hand with multiple healing wounds s/p numerous attempts to draw blood   Wt Readings from Last 3 Encounters:  07/24/17 138 lb (62.6 kg)  07/11/17 147 lb 7.8 oz (66.9 kg)  06/26/17 158 lb 15.2 oz (72.1 kg)    Lab Results  Component Value Date   WBC 2.7 (L) 07/14/2017   HGB 7.5 (L) 07/14/2017   HCT 22.8 (L) 07/14/2017   PLT 83 (L) 07/14/2017   GLUCOSE 84 07/14/2017   CHOL 84 12/01/2013   TRIG 58 12/01/2013   HDL 26 (L) 12/01/2013   LDLCALC 46 12/01/2013   ALT 15 (L) 07/14/2017   AST  35 07/14/2017   NA 137 07/14/2017   K 3.8 07/14/2017   CL 113 (H) 07/14/2017   CREATININE 0.93 07/14/2017   BUN <5 (L) 07/14/2017   CO2 17 (L) 07/14/2017   TSH 1.809 07/11/2017   INR 1.39 07/14/2017   HGBA1C  11/01/2009    5.1 (NOTE)                                                                       According to the ADA Clinical Practice Recommendations for 2011, when HbA1c is used as a screening test:   >=6.5%   Diagnostic of Diabetes Mellitus           (if abnormal result  is confirmed)  5.7-6.4%   Increased risk of developing Diabetes Mellitus  References:Diagnosis and Classification of Diabetes Mellitus,Diabetes XJOI,3254,98(YMEBR  1):S62-S69 and Standards of Medical Care in         Diabetes - 2011,Diabetes Care,2011,34  (Suppl 1):S11-S61.    Assessment/Plan:  Hospital discharge follow-up -pt admitted 3/15-3/19/19 for influenza A -improved -encouraged to mobilize some during the day, but not do too much given the edema of his R foot and wounds. -Attempted to order CBC and BMP however pt declines at this time.  Alcoholic cirrhosis of liver without ascites ([place]) -conttnue f/u with GI -pt encouraged to continue lacutlose TID, rifaximin  ED -pt advised that it may be difficult to treat given his chronic medical issues (h/o hypotension).  F/u in 1 month  Grier Mitts, MD

## 2017-08-05 ENCOUNTER — Other Ambulatory Visit: Payer: Self-pay | Admitting: Family Medicine

## 2017-08-05 NOTE — Telephone Encounter (Signed)
Rx refill on medications listed by outside providers: medications on list pended others listed: Rifaximin for provider review                                                                                                                                                     Vitamin B-1  LOV: 07/24/17  PCP: Volanda Napoleon  Pharmacy: verified

## 2017-08-05 NOTE — Telephone Encounter (Signed)
Copied from [place]. Topic: Quick Communication - Rx Refill/Question >> Aug 05, 2017  1:02 PM Oliver Pila B wrote: Medication: folic acid (FOLVITE) 1 MG tablet [[id number]] , nadolol (CORGARD) 20 MG tablet [[id number]] , rifaximin (XIFAXAN) tablet 550 mg   [[id number]], RA VITAMIN B-1 100 MG TABS [[id number]]  DISCONTINUED ,  Has the patient contacted their pharmacy? Yes.   (Agent: If no, request that the patient contact the pharmacy for the refill.) Preferred Pharmacy (with phone number or street name): [facility] Agent: Please be advised that RX refills may take up to 3 business days. We ask that you follow-up with your pharmacy.

## 2017-08-06 NOTE — Telephone Encounter (Signed)
Ok to refill meds? Please advise 

## 2017-08-10 ENCOUNTER — Telehealth: Payer: Self-pay | Admitting: Physician Assistant

## 2017-08-10 MED ORDER — NADOLOL 20 MG PO TABS: 20.0000 mg | ORAL_TABLET | Freq: Every day | ORAL | 6 refills | Status: DC

## 2017-08-10 MED ORDER — FOLIC ACID 1 MG PO TABS: 1.0000 mg | ORAL_TABLET | Freq: Every day | ORAL | 0 refills | Status: DC

## 2017-08-10 NOTE — Telephone Encounter (Signed)
Patient wife states patient needs refill of medication nadolol sent to [facility].  Pt had ov on 3.14.19.

## 2017-08-10 NOTE — Telephone Encounter (Signed)
It looks like refill has been sent to pharmacy by patients PCP. Left message on patients voicemail.

## 2017-08-11 ENCOUNTER — Other Ambulatory Visit: Payer: Self-pay | Admitting: Gastroenterology

## 2017-08-12 ENCOUNTER — Telehealth: Payer: Self-pay | Admitting: Family Medicine

## 2017-08-12 NOTE — Telephone Encounter (Signed)
Copied from [place] [phone]. Topic: Quick Communication - See Telephone Encounter >> Aug 12, 2017 10:22 AM Margot Ables wrote: CRM for notification. See Telephone encounter for: 08/12/17.  Calling to check status of [place] of Care being signed by Dr. Volanda Napoleon. Form was faxed to [phone] on 08/05/17 and she will refax now.

## 2017-08-26 ENCOUNTER — Telehealth: Payer: Self-pay | Admitting: Family Medicine

## 2017-08-26 NOTE — Telephone Encounter (Signed)
Copied from [place] [phone]. Topic: Quick Communication - See Telephone Encounter >> Aug 26, 2017  4:00 PM [place], Laban Emperor wrote: CRM for notification. See Telephone encounter for: 08/26/17.  Lavalle from [facility] calling to let Dr. Volanda Napoleon know that the pt has had a few falls, since his last nurse visit 08/12/17. She states the pt has no injury.   Contact# [phone]

## 2017-08-26 NOTE — Telephone Encounter (Signed)
Called Mark [name] and left message to return call.

## 2017-08-26 NOTE — Telephone Encounter (Signed)
Noted.  Is PT still working with pt?

## 2017-08-28 ENCOUNTER — Telehealth: Payer: Self-pay | Admitting: Family Medicine

## 2017-08-28 NOTE — Telephone Encounter (Signed)
Patient is not receiving PT at this time, but Mark [name] does feel he could benefit from some type of therapy.

## 2017-08-28 NOTE — Telephone Encounter (Signed)
Please advise 

## 2017-08-28 NOTE — Telephone Encounter (Signed)
Copied from [place] [phone]. Topic: Quick Communication - See Telephone Encounter >> Aug 28, 2017  4:06 PM Aurelio Brash B wrote: CRM for notification. See Telephone encounter for: 08/28/17. Devan from [facility] asked if the fax was received for Verbal order for skilled nursing 2 times a week for 2 weeks      [phone]    ext [phone]

## 2017-08-31 NOTE — Telephone Encounter (Signed)
If pt would be willing to do PT I am ok with this.

## 2017-08-31 NOTE — Telephone Encounter (Signed)
Yes. It was completed.

## 2017-08-31 NOTE — Telephone Encounter (Signed)
Devan notified. Nothing further needed.

## 2017-08-31 NOTE — Telephone Encounter (Signed)
Called Lavalle and left message with instructions.

## 2017-09-02 ENCOUNTER — Ambulatory Visit: Payer: Self-pay | Admitting: Family Medicine

## 2017-09-02 DIAGNOSIS — Z0289 Encounter for other administrative examinations: Secondary | ICD-10-CM

## 2017-09-07 ENCOUNTER — Ambulatory Visit: Payer: BC Managed Care – PPO | Admitting: Family Medicine

## 2017-09-11 ENCOUNTER — Telehealth: Payer: Self-pay | Admitting: Family Medicine

## 2017-09-11 NOTE — Telephone Encounter (Signed)
Request for expired medication protonix 40 mg. Prescription expired on 08/26/17  LOV  07/24/17 NOV  09/16/17  Dr. Lockie Pares

## 2017-09-11 NOTE — Telephone Encounter (Signed)
Copied from [place] [phone]. Topic: Quick Communication - See Telephone Encounter >> Sep 11, 2017  3:40 PM Conception Chancy, NT wrote: CRM for notification. See Telephone encounter for: 09/11/17.  Patient is requesting a refill on pantoprazole (PROTONIX) 40 MG tablet (he states he was given that at the hospital).  [facility] [phone] - [place], [address] AT [place] [address] Phone: [phone] Fax: [phone]

## 2017-09-14 MED ORDER — PANTOPRAZOLE SODIUM 40 MG PO TBEC: 40.0000 mg | DELAYED_RELEASE_TABLET | Freq: Every day | ORAL | 0 refills | Status: DC

## 2017-09-14 NOTE — Telephone Encounter (Signed)
That's fine.  Protonix 40 mg daily.  #90, 0 refills.

## 2017-09-14 NOTE — Telephone Encounter (Signed)
Please advise. This was last filled by hospitalist on discharge 06/27/17.

## 2017-09-16 ENCOUNTER — Other Ambulatory Visit: Payer: Self-pay

## 2017-09-16 ENCOUNTER — Ambulatory Visit (INDEPENDENT_AMBULATORY_CARE_PROVIDER_SITE_OTHER): Payer: BC Managed Care – PPO | Admitting: Family Medicine

## 2017-09-16 ENCOUNTER — Encounter: Payer: Self-pay | Admitting: Family Medicine

## 2017-09-16 VITALS — BP 110/62 | HR 90 | Temp 97.7°F | Wt 128.0 lb

## 2017-09-16 DIAGNOSIS — K7030 Alcoholic cirrhosis of liver without ascites: Secondary | ICD-10-CM | POA: Diagnosis not present

## 2017-09-16 DIAGNOSIS — R634 Abnormal weight loss: Secondary | ICD-10-CM

## 2017-09-16 DIAGNOSIS — S91301A Unspecified open wound, right foot, initial encounter: Secondary | ICD-10-CM | POA: Diagnosis not present

## 2017-09-16 MED ORDER — ALBUTEROL SULFATE HFA 108 (90 BASE) MCG/ACT IN AERS: 2.0000 | INHALATION_SPRAY | Freq: Four times a day (QID) | RESPIRATORY_TRACT | 1 refills | Status: DC | PRN

## 2017-09-16 MED ORDER — THIAMINE HCL 100 MG PO TABS: 100.0000 mg | ORAL_TABLET | Freq: Every day | ORAL | 1 refills | Status: DC

## 2017-09-16 MED ORDER — FOLIC ACID 1 MG PO TABS: 1.0000 mg | ORAL_TABLET | Freq: Every day | ORAL | 1 refills | Status: DC

## 2017-09-16 MED ORDER — PANTOPRAZOLE SODIUM 40 MG PO TBEC: 40.0000 mg | DELAYED_RELEASE_TABLET | Freq: Every day | ORAL | 0 refills | Status: DC

## 2017-09-16 MED ORDER — NADOLOL 20 MG PO TABS: 20.0000 mg | ORAL_TABLET | Freq: Every day | ORAL | 1 refills | Status: DC

## 2017-09-16 NOTE — Progress Notes (Signed)
Subjective:    Patient ID: [name], male    DOB: June 27, 1955, 62 y.o.   MRN: [id number]  No chief complaint on file.   HPI Patient was seen today for f/u.  Pt states he has been feeling better.  He has been able to walk around more without pain in his R foot.  His foot is getting better.  He states it is still open some, but not draining and the swelling has improved.  Pt has been wrapping his foot.  HH coming to the house wkly.  Pt asks if he has to continue taking several of his medications including Nadolol and spironolactone.   Pt is still drinking ~ 12 oz of beer per day and smoking cigarettes per his wife.  Pt does endorse continued EtOH use, but states he is not drinking a whole beer each day.  Past Medical History:  Diagnosis Date  . Alcohol abuse   . Anemia   . Arthritis   . Ascites   . Cirrhosis ([place])   . Heart murmur   . Hyperlipidemia   . Hypertension   . Leg swelling   . Myocardial infarction ([place]) 2012  . Stroke [facility]) 2012   no deficits  . Thrombocytopenia ([name])     No Known Allergies  ROS General: Denies fever, chills, night sweats, changes in weight, changes in appetite HEENT: Denies headaches, ear pain, changes in vision, rhinorrhea, sore throat CV: Denies CP, palpitations, SOB, orthopnea Pulm: Denies SOB, cough, wheezing GI: Denies abdominal pain, nausea, vomiting, diarrhea, constipation GU: Denies dysuria, hematuria, frequency, vaginal discharge Msk: Denies muscle cramps, joint pains Neuro: Denies weakness, numbness, tingling Skin: Denies rashes, bruising  +R foot wound Psych: Denies depression, anxiety, hallucinations     Objective:    Blood pressure 110/62, pulse 90, temperature 97.7 F (36.5 C), temperature source Oral, weight 128 lb (58.1 kg), SpO2 97 %.   Gen. Pleasant, well-nourished, in no distress, normal affect   HEENT: [place]/AT, face symmetric, no scleral icterus, PERRLA, nares patent without drainage, pharynx without erythema  or exudate. Lungs: no accessory muscle use Cardiovascular: RRR, no peripheral edema Abdomen: BS present, soft, NT/ND. Neuro:  A&Ox3, CN II-XII intact, normal gait Skin:  Warm, no rash.  R foot with 2 cm well healing wound with healthy appearing pink tissue and granulation tissue present.  Small wound similar in appearance medial and superior to the 1st wound.   Wt Readings from Last 3 Encounters:  09/16/17 128 lb (58.1 kg)  07/24/17 138 lb (62.6 kg)  07/11/17 147 lb 7.8 oz (66.9 kg)    Lab Results  Component Value Date   WBC 2.7 (L) 07/14/2017   HGB 7.5 (L) 07/14/2017   HCT 22.8 (L) 07/14/2017   PLT 83 (L) 07/14/2017   GLUCOSE 84 07/14/2017   CHOL 84 12/01/2013   TRIG 58 12/01/2013   HDL 26 (L) 12/01/2013   LDLCALC 46 12/01/2013   ALT 15 (L) 07/14/2017   AST 35 07/14/2017   NA 137 07/14/2017   K 3.8 07/14/2017   CL 113 (H) 07/14/2017   CREATININE 0.93 07/14/2017   BUN <5 (L) 07/14/2017   CO2 17 (L) 07/14/2017   TSH 1.809 07/11/2017   INR 1.39 07/14/2017   HGBA1C  11/01/2009    5.1 (NOTE)  According to the ADA Clinical Practice Recommendations for 2011, when HbA1c is used as a screening test:   >=6.5%   Diagnostic of Diabetes Mellitus           (if abnormal result  is confirmed)  5.7-6.4%   Increased risk of developing Diabetes Mellitus  References:Diagnosis and Classification of Diabetes Mellitus,Diabetes EXNT,7001,74(BSWHQ 1):S62-S69 and Standards of Medical Care in         Diabetes - 2011,Diabetes PRFF,6384,66  (Suppl 1):S11-S61.    Assessment/Plan:  Wound of right foot -Healing -Patient encouraged to continue wrapping foot when wearing shoes to prevent irritation. -Encouraged to leave area open to air when at home -Discussed signs and symptoms of infection.  Alcoholic cirrhosis, unspecified whether ascites present [facility]) -Patient encouraged to continue not along, Spironolactone, thiamine, Lasix,  folic acid. -Patient encouraged to keep follow-up appointments with GI. -Patient encouraged to refrain from drinking  Weight loss -Weight today 120 pounds.  Was 138 lbs on 07/24/17 -Patient encouraged to increase p.o. intake. -Consider boost or Ensure  Follow-up in 1 month, sooner if needed  Grier Mitts, MD

## 2017-09-21 ENCOUNTER — Other Ambulatory Visit: Payer: Self-pay | Admitting: Family Medicine

## 2017-09-21 ENCOUNTER — Other Ambulatory Visit: Payer: Self-pay | Admitting: Gastroenterology

## 2017-09-25 ENCOUNTER — Ambulatory Visit: Payer: Self-pay | Admitting: Gastroenterology

## 2017-09-25 ENCOUNTER — Telehealth: Payer: Self-pay | Admitting: Gastroenterology

## 2017-10-01 ENCOUNTER — Telehealth: Payer: Self-pay | Admitting: Family Medicine

## 2017-10-01 NOTE — Telephone Encounter (Signed)
Copied from Center [id number]. Topic: Quick Communication - See Telephone Encounter >> Oct 01, 2017 10:16 AM Vernona Rieger wrote: CRM for notification. See Telephone encounter for: 10/01/17.  [place], RN from [facility] called to let [name] know that he had fallen yesterday, no injuries.

## 2017-10-01 NOTE — Telephone Encounter (Signed)
Ok

## 2017-10-13 ENCOUNTER — Telehealth: Payer: Self-pay | Admitting: Family Medicine

## 2017-10-13 NOTE — Telephone Encounter (Unsigned)
Copied from [place] [id number]. Topic: General - Other >> Oct 13, 2017  9:41 AM Carolyn Stare wrote:  Tressia Miners with [facility] call to req continue orders for wound care   [phone]

## 2017-10-14 NOTE — Telephone Encounter (Signed)
Spoke with Traci with [facility], gave verbal orders to continue wound care for the pt per [name].

## 2017-10-23 ENCOUNTER — Other Ambulatory Visit: Payer: Self-pay | Admitting: Family Medicine

## 2017-10-27 ENCOUNTER — Ambulatory Visit: Payer: BC Managed Care – PPO | Admitting: Family Medicine

## 2017-10-27 ENCOUNTER — Telehealth: Payer: Self-pay | Admitting: Family Medicine

## 2017-10-27 NOTE — Telephone Encounter (Signed)
Sent to Dr. Volanda Napoleon as Juluis Rainier

## 2017-10-27 NOTE — Telephone Encounter (Signed)
Patient had an appt with [name], however arrived too late to be seen (12 mins late). Patient was offered a new appointment with a different provider today, pt refused to see another provider.  Patient couldn't come back tomorrow to see [name] so he said he was going to go to the hospital today to be seen then left.

## 2017-11-19 ENCOUNTER — Other Ambulatory Visit: Payer: Self-pay | Admitting: Gastroenterology

## 2017-11-19 NOTE — Telephone Encounter (Signed)
May I refill this? Will route to [employer] as she saw this patient in April. Dr Ardis Hughs is out of the office this week.

## 2017-11-20 ENCOUNTER — Other Ambulatory Visit: Payer: Self-pay | Admitting: Family Medicine

## 2017-12-03 ENCOUNTER — Other Ambulatory Visit: Payer: Self-pay | Admitting: Family Medicine

## 2017-12-03 ENCOUNTER — Ambulatory Visit (INDEPENDENT_AMBULATORY_CARE_PROVIDER_SITE_OTHER): Payer: BC Managed Care – PPO | Admitting: Family Medicine

## 2017-12-03 ENCOUNTER — Encounter: Payer: Self-pay | Admitting: Family Medicine

## 2017-12-03 VITALS — BP 108/60 | HR 70 | Temp 97.7°F | Wt 133.0 lb

## 2017-12-03 DIAGNOSIS — R413 Other amnesia: Secondary | ICD-10-CM | POA: Diagnosis not present

## 2017-12-03 DIAGNOSIS — E7220 Disorder of urea cycle metabolism, unspecified: Secondary | ICD-10-CM

## 2017-12-03 DIAGNOSIS — K7030 Alcoholic cirrhosis of liver without ascites: Secondary | ICD-10-CM

## 2017-12-03 LAB — COMPREHENSIVE METABOLIC PANEL
ALT: 11 U/L (ref 0–53)
AST: 25 U/L (ref 0–37)
Albumin: 2.8 g/dL — ABNORMAL LOW (ref 3.5–5.2)
Alkaline Phosphatase: 127 U/L — ABNORMAL HIGH (ref 39–117)
BUN: 13 mg/dL (ref 6–23)
CO2: 24 mEq/L (ref 19–32)
Calcium: 8.6 mg/dL (ref 8.4–10.5)
Chloride: 106 mEq/L (ref 96–112)
Creatinine, Ser: 1.42 mg/dL (ref 0.40–1.50)
GFR: 64.5 mL/min (ref >60.00)
Glucose, Bld: 101 mg/dL — ABNORMAL HIGH (ref 70–99)
Potassium: 4.6 mEq/L (ref 3.5–5.1)
Sodium: 135 mEq/L (ref 135–145)
Total Bilirubin: 1.2 mg/dL (ref 0.2–1.2)
Total Protein: 7.1 g/dL (ref 6.0–8.3)

## 2017-12-03 LAB — CBC WITH DIFFERENTIAL/PLATELET
Basophils Absolute: 0.1 10*3/uL (ref 0.0–0.1)
Basophils Relative: 2.1 % (ref 0.0–3.0)
Eosinophils Absolute: 0.6 10*3/uL (ref 0.0–0.7)
Eosinophils Relative: 16.1 % — ABNORMAL HIGH (ref 0.0–5.0)
HCT: 27.3 % — ABNORMAL LOW (ref 39.0–52.0)
Hemoglobin: 9.4 g/dL — ABNORMAL LOW (ref 13.0–17.0)
Lymphocytes Relative: 30.9 % (ref 12.0–46.0)
Lymphs Abs: 1.1 10*3/uL (ref 0.7–4.0)
MCHC: 34.4 g/dL (ref 30.0–36.0)
MCV: 104.9 fl — ABNORMAL HIGH (ref 78.0–100.0)
Monocytes Absolute: 0.4 10*3/uL (ref 0.1–1.0)
Monocytes Relative: 10.2 % (ref 3.0–12.0)
Neutro Abs: 1.4 10*3/uL (ref 1.4–7.7)
Neutrophils Relative %: 40.7 % — ABNORMAL LOW (ref 43.0–77.0)
Platelets: 114 10*3/uL — ABNORMAL LOW (ref 150.0–400.0)
RBC: 2.6 Mil/uL — ABNORMAL LOW (ref 4.22–5.81)
RDW: 16.5 % — ABNORMAL HIGH (ref 11.5–15.5)
WBC: 3.4 10*3/uL — ABNORMAL LOW (ref 4.0–10.5)

## 2017-12-03 LAB — AMMONIA: Ammonia: 159 umol/L — ABNORMAL HIGH (ref 11–35)

## 2017-12-03 LAB — VITAMIN B12: Vitamin B-12: 597 pg/mL (ref 211–911)

## 2017-12-03 MED ORDER — LACTULOSE 10 GM/15ML PO SOLN: 30.0000 g | Freq: Two times a day (BID) | ORAL | 3 refills | Status: DC

## 2017-12-03 NOTE — Progress Notes (Signed)
Subjective:    Patient ID: Mark [name], male    DOB: 10/11/1955, 62 y.o.   MRN: [id number]  No chief complaint on file.   HPI Patient was seen today for ongoing concern.  [name] wife expresses concern about pt's memory/behavior.  Last wk pt put his clothes on backwards, tried to use a hair brush as a telephone, was difficult to understand, and "mean".  [name] wife called an ambulance but pt refused care.  Pt later returned to baseline.  [name] wife endorse one other similar episode.  Pt states "I'm not crazy like they are trying to make it seem".  Per wife pt does not like taking lactulose.  In the past was advised to take TID.  Pt still drinking beer.     Past Medical History:  Diagnosis Date  . Alcohol abuse   . Anemia   . Arthritis   . Ascites   . Cirrhosis ([place])   . Heart murmur   . Hyperlipidemia   . Hypertension   . Leg swelling   . Myocardial infarction ([place]) 2012  . Stroke [facility]) 2012   no deficits  . Thrombocytopenia ([name])     No Known Allergies  ROS General: Denies fever, chills, night sweats, changes in weight, changes in appetite  +confusion HEENT: Denies headaches, ear pain, changes in vision, rhinorrhea, sore throat CV: Denies CP, palpitations, SOB, orthopnea Pulm: Denies SOB, cough, wheezing GI: Denies abdominal pain, nausea, vomiting, diarrhea, constipation GU: Denies dysuria, hematuria, frequency, vaginal discharge Msk: Denies muscle cramps, joint pains Neuro: Denies weakness, numbness, tingling Skin: Denies rashes, bruising Psych: Denies depression, anxiety, hallucinations     Objective:    Blood pressure 108/60, pulse 70, temperature 97.7 F (36.5 C), temperature source Oral, weight 133 lb (60.3 kg), SpO2 96 %.   Gen. Pleasant, well-nourished, in no distress, normal affect  HEENT: [place]/AT, face symmetric, conjunctiva clear no scleral icterus, PERRLA, nares patent without drainage Lungs: no accessory muscle use Cardiovascular: RRR Neuro:  A&Ox3, CN  II-XII intact, normal gait MOCA administered in clinic--to be scanned  Skin:  Warm, no lesions/ rash   Wt Readings from Last 3 Encounters:  12/03/17 133 lb (60.3 kg)  09/16/17 128 lb (58.1 kg)  07/24/17 138 lb (62.6 kg)    Lab Results  Component Value Date   WBC 2.7 (L) 07/14/2017   HGB 7.5 (L) 07/14/2017   HCT 22.8 (L) 07/14/2017   PLT 83 (L) 07/14/2017   GLUCOSE 84 07/14/2017   CHOL 84 12/01/2013   TRIG 58 12/01/2013   HDL 26 (L) 12/01/2013   LDLCALC 46 12/01/2013   ALT 15 (L) 07/14/2017   AST 35 07/14/2017   NA 137 07/14/2017   K 3.8 07/14/2017   CL 113 (H) 07/14/2017   CREATININE 0.93 07/14/2017   BUN <5 (L) 07/14/2017   CO2 17 (L) 07/14/2017   TSH 1.809 07/11/2017   INR 1.39 07/14/2017   HGBA1C  11/01/2009    5.1 (NOTE)                                                                       According to the ADA Clinical Practice Recommendations for 2011, when HbA1c is used as a screening test:   >=  6.5%   Diagnostic of Diabetes Mellitus           (if abnormal result  is confirmed)  5.7-6.4%   Increased risk of developing Diabetes Mellitus  References:Diagnosis and Classification of Diabetes Mellitus,Diabetes WLSL,3734,28(JGOTL 1):S62-S69 and Standards of Medical Care in         Diabetes - 2011,Diabetes Care,2011,34  (Suppl 1):S11-S61.    Assessment/Plan:  Memory deficit  -discussed possible causes including increased ammonia levels 2/2 chronic conditions, TIA, dementia -MoCA score 17/30.  18/30 if given 1 pt for educations.  Score <26 =Cognitive impairment. -will refer for neuropsych testing.   -consider imaging of brain - Plan: Ambulatory referral to Neurology, Comprehensive metabolic panel, Ammonia  Alcoholic cirrhosis, unspecified whether ascites present ([place])  - Plan: CBC with Differential/Platelet, Comprehensive metabolic panel, Ammonia, Vitamin B12  F/u prn in the next few wks   More than 50% of over 20 minutes spent in total in caring for this patient was  spent face-to-face with the patient, counseling and/or coordinating care.   Grier Mitts, MD

## 2017-12-03 NOTE — Patient Instructions (Signed)
Do not forget to schedule an appointment with her GI doctor, Dr. Olena Heckle.  Someone should be calling you about scheduling an appointment for the neuropsych testing we talked about today's visit.

## 2017-12-04 ENCOUNTER — Other Ambulatory Visit: Payer: Self-pay

## 2017-12-04 ENCOUNTER — Ambulatory Visit: Payer: Self-pay | Admitting: Gastroenterology

## 2017-12-04 MED ORDER — THIAMINE HCL 100 MG PO TABS: 100.0000 mg | ORAL_TABLET | Freq: Every day | ORAL | 1 refills | Status: DC

## 2017-12-04 MED ORDER — NADOLOL 20 MG PO TABS: ORAL_TABLET | ORAL | 0 refills | Status: DC

## 2017-12-04 MED ORDER — FUROSEMIDE 20 MG PO TABS: 20.0000 mg | ORAL_TABLET | Freq: Two times a day (BID) | ORAL | 0 refills | Status: DC

## 2017-12-04 MED ORDER — FOLIC ACID 1 MG PO TABS: ORAL_TABLET | ORAL | 1 refills | Status: DC

## 2017-12-04 MED ORDER — PANTOPRAZOLE SODIUM 40 MG PO TBEC: 40.0000 mg | DELAYED_RELEASE_TABLET | Freq: Every day | ORAL | 0 refills | Status: DC

## 2017-12-18 ENCOUNTER — Other Ambulatory Visit: Payer: Self-pay | Admitting: Family Medicine

## 2018-01-02 ENCOUNTER — Other Ambulatory Visit: Payer: Self-pay | Admitting: Family Medicine

## 2018-01-07 ENCOUNTER — Other Ambulatory Visit: Payer: Self-pay | Admitting: Family Medicine

## 2018-02-08 ENCOUNTER — Other Ambulatory Visit: Payer: Self-pay | Admitting: Physician Assistant

## 2018-02-12 NOTE — Telephone Encounter (Signed)
Ok to refill 

## 2018-02-13 ENCOUNTER — Other Ambulatory Visit: Payer: Self-pay | Admitting: Family Medicine

## 2018-03-03 ENCOUNTER — Other Ambulatory Visit: Payer: Self-pay | Admitting: Family Medicine

## 2018-03-18 ENCOUNTER — Other Ambulatory Visit: Payer: Self-pay | Admitting: Family Medicine

## 2018-03-18 ENCOUNTER — Other Ambulatory Visit: Payer: Self-pay | Admitting: Physician Assistant

## 2018-03-19 ENCOUNTER — Other Ambulatory Visit: Payer: Self-pay | Admitting: Family Medicine

## 2018-04-18 ENCOUNTER — Other Ambulatory Visit: Payer: Self-pay | Admitting: Family Medicine

## 2018-04-19 ENCOUNTER — Ambulatory Visit (INDEPENDENT_AMBULATORY_CARE_PROVIDER_SITE_OTHER): Payer: BC Managed Care – PPO | Admitting: Internal Medicine

## 2018-04-19 ENCOUNTER — Other Ambulatory Visit: Payer: Self-pay

## 2018-04-19 ENCOUNTER — Encounter: Payer: Self-pay | Admitting: Internal Medicine

## 2018-04-19 ENCOUNTER — Telehealth: Payer: Self-pay | Admitting: Family Medicine

## 2018-04-19 VITALS — BP 110/58 | HR 98 | Temp 97.4°F | Wt 140.0 lb

## 2018-04-19 DIAGNOSIS — T22211A Burn of second degree of right forearm, initial encounter: Secondary | ICD-10-CM | POA: Diagnosis not present

## 2018-04-19 DIAGNOSIS — R21 Rash and other nonspecific skin eruption: Secondary | ICD-10-CM | POA: Diagnosis not present

## 2018-04-19 DIAGNOSIS — L853 Xerosis cutis: Secondary | ICD-10-CM

## 2018-04-19 MED ORDER — SPIRONOLACTONE 50 MG PO TABS: 100.0000 mg | ORAL_TABLET | Freq: Two times a day (BID) | ORAL | 0 refills | Status: DC

## 2018-04-19 MED ORDER — SILVER SULFADIAZINE 1 % EX CREA: 1.0000 "application " | TOPICAL_CREAM | Freq: Every day | CUTANEOUS | 1 refills | Status: DC

## 2018-04-19 NOTE — Patient Instructions (Addendum)
[place]   and keep burn covered.     Non stick dressing .   Daily  No trauma or heat to the area .   Skin areas otherwise   Could many causes.   Use a non soap   Such as Dove    Use moisturizer  As soon as out of bath .   Eucerin or cerave   And may need to see dermatologist   sometimes a vitamin deficiency can add to  The dryness .  Take yo u vitamins.   Plan ROV  check   Friday or Monday .             Burn Care, Adult A burn is an injury to the skin or the tissues under the skin. There are three types of burns:  First degree. These burns may cause the skin to be red and slightly swollen.  Second degree. These burns are very painful and cause the skin to be very red. The skin may also leak fluid, look shiny, and develop blisters.  Third degree. These burns cause permanent damage. They either turn the skin white or black and make it look charred, dry, and leathery. Taking care of your burn properly can help to prevent pain and infection. It can also help the burn to heal more quickly. What are the risks? Complications from burns include:  Damage to the skin.  Reduced blood flow near the injury.  Dead tissue.  Scarring.  Problems with movement, if the burn happened near a joint or on the hands or feet. Severe burns can lead to problems that affect the whole body, such as:  Fluid loss.  Less blood circulating in the body.  Inability to maintain a normal core body temperature (thermoregulation).  Infection.  Shock.  Problems breathing. How to care for a first-degree burn Right after a burn:  Rinse or soak the burn under cool water until the pain stops. Do not put ice on your burn. This can cause more damage.  Lightly cover the burn with a sterile cloth (dressing). Burn care  Follow instructions from your health care provider about: ? How to clean and take care of the burn. ? When to change and remove the dressing.  Check your burn every day for  signs of infection. Check for: ? More redness, swelling, or pain. ? Warmth. ? Pus or a bad smell. Medicine  Take over-the-counter and prescription medicines only as told by your health care provider.  If you were prescribed antibiotic medicine, take or apply it as told by your health care provider. Do not stop using the antibiotic even if your condition improves. General instructions  To prevent infection, do not put butter, oil, or other home remedies on your burn.  Do not rub your burn, even when you are cleaning it.  Protect your burn from the sun. How to care for a second-degree burn Right after a burn:  Rinse or soak the burn under cool water. Do this for several minutes. Do not put ice on your burn. This can cause more damage.  Lightly cover the burn with a sterile cloth (dressing). Burn care  Raise (elevate) the injured area above the level of your heart while sitting or lying down.  Follow instructions from your health care provider about: ? How to clean and take care of the burn. ? When to change and remove the dressing.  Check your burn every day for signs of infection. Check for: ? More  redness, swelling, or pain. ? Warmth. ? Pus or a bad smell. Medicine   Take over-the-counter and prescription medicines only as told by your health care provider.  If you were prescribed antibiotic medicine, take or apply it as told by your health care provider. Do not stop using the antibiotic even if your condition improves. General instructions  To prevent infection: ? Do not put butter, oil, or other home remedies on the burn. ? Do not scratch or pick at the burn. ? Do not break any blisters. ? Do not peel skin.  Do not rub your burn, even when you are cleaning it.  Protect your burn from the sun. How to care for a third-degree burn Right after a burn:  Lightly cover the burn with gauze.  Seek immediate medical attention. Burn care  Raise (elevate) the injured  area above the level of your heart while sitting or lying down.  Drink enough fluid to keep your urine clear or pale yellow.  Rest as told by your health care provider. Do not participate in sports or other physical activities until your health care provider approves.  Follow instructions from your health care provider about: ? How to clean and take care of the burn. ? When to change and remove the dressing.  Check your burn every day for signs of infection. Check for: ? More redness, swelling, or pain. ? Warmth. ? Pus or a bad smell. Medicine  Take over-the-counter and prescription medicines only as told by your health care provider.  If you were prescribed antibiotic medicine, take or apply it as told by your health care provider. Do not stop using the antibiotic even if your condition improves. General instructions  To prevent infection: ? Do not put butter, oil, or other home remedies on the burn. ? Do not scratch or pick at the burn. ? Do not break any blisters. ? Do not peel skin. ? Do not rub your burn, even when you are cleaning it.  Protect your burn from the sun.  Keep all follow-up visits as told by your health care provider. This is important. Contact a health care provider if:  Your condition does not improve.  Your condition gets worse.  You have a fever.  Your burn changes in appearance or develops black or red spots.  Your burn feels warm to the touch.  Your pain is not controlled with medicine. Get help right away if:  You have redness, swelling, or pain at the site of the burn.  You have fluid, blood, or pus coming from your burn.  You have red streaks near the burn.  You have severe pain. This information is not intended to replace advice given to you by your health care provider. Make sure you discuss any questions you have with your health care provider. Document Released: 04/14/2005 Document Revised: 11/04/2015 Document Reviewed:  10/02/2015 Elsevier Interactive Patient Education  2019 [employer].
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
## 2018-04-19 NOTE — Telephone Encounter (Signed)
Pt states that he wants to try this cream,Please Advise

## 2018-04-19 NOTE — Telephone Encounter (Signed)
Copied from [place] [id number]. Topic: Quick Communication - See Telephone Encounter >> Apr 19, 2018  4:56 PM Antonieta Iba C wrote: CRM for notification. See Telephone encounter for: 04/19/18.  Pt was seen today. He was told by provider to let him know name of the cream that he needs. Pt says that the name is Triamcinolone    Pt would like to have this sent in today if possible.

## 2018-04-20 MED ORDER — TRIAMCINOLONE ACETONIDE 0.025 % EX OINT: 1.0000 "application " | TOPICAL_OINTMENT | Freq: Two times a day (BID) | CUTANEOUS | 1 refills | Status: DC

## 2018-04-20 NOTE — Telephone Encounter (Signed)
Left message for pt to call back crm created

## 2018-04-20 NOTE — Telephone Encounter (Signed)
That is a  Steroid cream.   And can help itching but would caution on face .    Can send in the oiintment and use twice a day for 10 days.  Order has been placed for dermatology appt.  You should be contacted about   That appt.

## 2018-04-20 NOTE — Telephone Encounter (Signed)
Left message on machine  CRM 

## 2018-04-22 ENCOUNTER — Telehealth: Payer: Self-pay | Admitting: Family Medicine

## 2018-04-22 NOTE — Telephone Encounter (Deleted)
Patient is scheduled for  Pt scheduled for 06/24/2017-@3 :00 PM  Mark [name]  Dermatologist in [place], [facility]  [address], [place],  [address]   [phone]  Pt want to now if  Could have a Rx for

## 2018-04-22 NOTE — Telephone Encounter (Signed)
Pt scheduled for 06/24/2017@3 :00 pm --- First available appt - Most of the dermatologist office are booking out In march for new pt  Mark [name]  Dermatologist in [place], [place]  [address] [place], [place] [address]   [phone]  Pt is requesting a Rx for  triamcinolone (KENALOG) 0.025 % ointment - called into his pharmacy  Until he can see the Dermatologist please advise

## 2018-04-22 NOTE — Telephone Encounter (Signed)
triamcinolone (KENALOG) 0.025 % ointment

## 2018-04-25 ENCOUNTER — Emergency Department (HOSPITAL_COMMUNITY)
Admission: EM | Admit: 2018-04-25 | Discharge: 2018-04-25 | Disposition: A | Discharge status: Home / Self Care | Payer: BC Managed Care – PPO | Attending: Emergency Medicine | Admitting: Emergency Medicine

## 2018-04-25 ENCOUNTER — Other Ambulatory Visit: Payer: Self-pay

## 2018-04-25 DIAGNOSIS — N183 Chronic kidney disease, stage 3 (moderate): Secondary | ICD-10-CM | POA: Insufficient documentation

## 2018-04-25 DIAGNOSIS — Z96651 Presence of right artificial knee joint: Secondary | ICD-10-CM | POA: Insufficient documentation

## 2018-04-25 DIAGNOSIS — R21 Rash and other nonspecific skin eruption: Secondary | ICD-10-CM | POA: Diagnosis not present

## 2018-04-25 DIAGNOSIS — Z87891 Personal history of nicotine dependence: Secondary | ICD-10-CM | POA: Diagnosis not present

## 2018-04-25 DIAGNOSIS — I129 Hypertensive chronic kidney disease with stage 1 through stage 4 chronic kidney disease, or unspecified chronic kidney disease: Secondary | ICD-10-CM | POA: Insufficient documentation

## 2018-04-25 DIAGNOSIS — Z79899 Other long term (current) drug therapy: Secondary | ICD-10-CM | POA: Diagnosis not present

## 2018-04-25 MED ORDER — PREDNISONE 20 MG PO TABS: ORAL_TABLET | ORAL | 0 refills | Status: DC

## 2018-04-25 MED ORDER — DIPHENHYDRAMINE-ZINC ACETATE 2-0.1 % EX CREA: 1.0000 "application " | TOPICAL_CREAM | Freq: Three times a day (TID) | CUTANEOUS | 0 refills | Status: DC | PRN

## 2018-04-25 NOTE — ED Provider Notes (Signed)
[facility] EMERGENCY DEPARTMENT Provider Note   CSN: [id number] Arrival date & time: 04/25/18  1618     History   Chief Complaint Chief Complaint  Patient presents with  . Rash    HPI Gentle Hoge is a 62 y.o. male.  The history is provided by the patient and medical records. No language interpreter was used.  Rash       63 year old male with history of alcohol abuse, anemia, cirrhosis, prior MI, stroke, thrombocytopenia presenting for evaluation of a rash.  Patient report for the past 2 weeks he has noticed a rash throughout his body is getting progressively worse.  Rash is both itchy and painful started in his forearms bilaterally and has now spread throughout the body.  He has been scratching at it, and when he takes a bath, he noticed a lot of sloughing of the skin.  States that the rash is really annoying.  He does not complain of any associated fever, headache, neck stiffness, trouble swallowing, tongue swelling, chest pain, shortness of breath, abdominal cramping or joint pain.  He denies any environmental changes or medication changes.  No one else at home with similar rash.  He has never had this rash before.  He has tried Aquaphor and steroid cream at home without relief.  Past Medical History:  Diagnosis Date  . Alcohol abuse   . Anemia   . Arthritis   . Ascites   . Cirrhosis ([place])   . Heart murmur   . Hyperlipidemia   . Hypertension   . Leg swelling   . Myocardial infarction ([place]) 2012  . Stroke [facility]) 2012   no deficits  . Thrombocytopenia [facility])     Patient Active Problem List   Diagnosis Date Noted  . SIRS (systemic inflammatory response syndrome) ([place]) 07/11/2017  . Febrile illness   . Duodenal ulcer   . Renal failure   . Cirrhosis ([place])   . Coffee ground emesis   . Acute on chronic anemia   . Septic shock ([place]) 06/18/2017  . Dehydration 06/17/2017  . Hypotension 06/17/2017  . Acute on chronic renal failure ([place])  06/17/2017  . Hyponatremia 06/17/2017  . Thrombocytopenia ([place]) 06/17/2017  . Sepsis ([place]) 06/17/2017  . Leg edema, right 06/17/2017  . Acute metabolic encephalopathy 63/14/9702  . Anemia, iron deficiency   . Benign neoplasm of ascending colon   . Hemorrhoids   . Portal hypertensive gastropathy ([place])   . Gastritis and gastroduodenitis   . Esophageal varices without bleeding ([place])   . H/O: CVA (cerebrovascular accident) 12/01/2013  . ACS (acute coronary syndrome) ([place]) 12/01/2013  . Anasarca 12/01/2013  . Substance abuse ([place]) 12/01/2013  . CKD (chronic kidney disease) stage 3, GFR 30-59 ml/min ([name]) 12/01/2013  . S/P right TKA 05/02/2013  . Preop cardiovascular exam 04/14/2013  . Murmur 04/14/2013  . Right inguinal hernia 12/26/2010  . Cirrhosis, alcoholic ([place]) 63/78/5885    Past Surgical History:  Procedure Laterality Date  . COLONOSCOPY WITH PROPOFOL N/A 02/07/2016   Procedure: COLONOSCOPY WITH PROPOFOL;  Surgeon: Milus Banister, MD;  Location: [facility] ENDOSCOPY;  Service: Endoscopy;  Laterality: N/A;  . ESOPHAGOGASTRODUODENOSCOPY (EGD) WITH PROPOFOL N/A 02/07/2016   Procedure: ESOPHAGOGASTRODUODENOSCOPY (EGD) WITH PROPOFOL;  Surgeon: Milus Banister, MD;  Location: [facility] ENDOSCOPY;  Service: Endoscopy;  Laterality: N/A;  . ESOPHAGOGASTRODUODENOSCOPY (EGD) WITH PROPOFOL N/A 06/22/2017   Procedure: ESOPHAGOGASTRODUODENOSCOPY (EGD) WITH PROPOFOL;  Surgeon: Jerene Bears, MD;  Location: [facility] ENDOSCOPY;  Service: Gastroenterology;  Laterality: N/A;  . HERNIA REPAIR Right    inguinal  . KNEE ARTHROSCOPY     bilateral/  12/14  . TOTAL KNEE ARTHROPLASTY Right 05/02/2013   Procedure: RIGHT TOTAL KNEE ARTHROPLASTY;  Surgeon: Mauri Pole, MD;  Location: [facility] ORS;  Service: Orthopedics;  Laterality: Right;        Home Medications    Prior to Admission medications   Medication Sig Start Date End Date Taking? Authorizing Provider  albuterol (PROVENTIL HFA;VENTOLIN HFA) 108 (90 Base) MCG/ACT  inhaler Inhale 2 puffs into the lungs every 6 (six) hours as needed for wheezing or shortness of breath. 09/16/17   [name], MD  folic acid (FOLVITE) 1 MG tablet TAKE 1 TABLET BY MOUTH EVERY DAY 03/19/18   [name], MD  furosemide (LASIX) 20 MG tablet TAKE 1 TABLET BY MOUTH TWICE DAILY 04/19/18   [name], MD  guaiFENesin (MUCINEX) 600 MG 12 hr tablet Take 1 tablet (600 mg total) by mouth 2 (two) times daily as needed for cough or to loosen phlegm. 07/14/17   Rosita Fire, MD  lactulose (CHRONULAC) 10 GM/15ML solution Take 45 mLs (30 g total) by mouth 2 (two) times daily. 12/03/17   [name], MD  nadolol (CORGARD) 20 MG tablet TAKE 1 TABLET BY MOUTH EVERY DAY 02/17/18   [name], MD  pantoprazole (PROTONIX) 40 MG tablet TAKE 1 TABLET(40 MG) BY MOUTH DAILY 03/03/18   [name], MD  silver sulfADIAZINE (SILVADENE) 1 % cream Apply 1 application topically daily. To  Burn 04/19/18   Panosh, Standley Brooking, MD  spironolactone (ALDACTONE) 50 MG tablet Take 2 tablets (100 mg total) by mouth 2 (two) times daily. 04/19/18   Levin Erp, PA  thiamine 100 MG tablet Take 1 tablet (100 mg total) by mouth daily. 12/04/17   [name], MD  triamcinolone (KENALOG) 0.025 % ointment Apply 1 application topically 2 (two) times daily. To dry skin rash .Limit use on face to  10 days 04/20/18   Panosh, Standley Brooking, MD    Family History Family History  Problem Relation Age of Onset  . Hypertension Father   . Diabetes Father   . Dementia Mother   . Lupus Sister     Social History Social History   Tobacco Use  . Smoking status: Former Smoker    Packs/day: 0.50    Years: 10.00    Pack years: 5.00    Types: Cigarettes    Last attempt to quit: 01/05/2016    Years since quitting: 2.3  . Smokeless tobacco: Never Used  . Tobacco comment: 4 cigarettes a day  Substance Use Topics  . Alcohol use: No    Comment: 40 oz per week as of 01-19-17  . Drug use: Yes     Types: Marijuana, Heroin    Comment: took about 1 month ago- as of 01-19-17     Allergies   Patient has no known allergies.   Review of Systems Review of Systems  Skin: Positive for rash.  All other systems reviewed and are negative.    Physical Exam Updated Vital Signs BP 114/76 (BP Location: Right Arm)   Pulse 100   Temp 98.3 F (36.8 C) (Oral)   Resp 14   Ht 5\' 8"  (1.727 m)   Wt 63.5 kg   SpO2 100%   BMI 21.29 kg/m   Physical Exam Vitals signs and nursing note reviewed.  Constitutional:  General: He is not in acute distress.    Appearance: He is well-developed.     Comments: Patient appears chronically ill-appearing but nontoxic  HENT:     Head: Atraumatic.     Comments: No rash in mouth, no tongue swelling    Mouth/Throat:     Mouth: Mucous membranes are moist.  Eyes:     Conjunctiva/sclera: Conjunctivae normal.  Neck:     Musculoskeletal: Neck supple.  Cardiovascular:     Rate and Rhythm: Normal rate and regular rhythm.  Pulmonary:     Effort: Pulmonary effort is normal.     Breath sounds: Normal breath sounds. No wheezing.  Abdominal:     Palpations: Abdomen is soft.     Tenderness: There is no abdominal tenderness.  Skin:    Findings: Rash (Scattered maculopapular rash noted throughout entire body most significant to bilateral forearm with multiple excoriation marks and scabs noted throughout.  No erythema and nontender.) present.  Neurological:     Mental Status: He is alert.      ED Treatments / Results  Labs (all labs ordered are listed, but only abnormal results are displayed) Labs Reviewed - No data to display  EKG None  Radiology No results found.  Procedures Procedures (including critical care time)  Medications Ordered in ED Medications - No data to display   Initial Impression / Assessment and Plan / ED Course  I have reviewed the triage vital signs and the nursing notes.  Pertinent labs & imaging results that were  available during my care of the patient were reviewed by me and considered in my medical decision making (see chart for details).     BP 114/76 (BP Location: Right Arm)   Pulse 100   Temp 98.3 F (36.8 C) (Oral)   Resp 14   Ht 5\' 8"  (1.727 m)   Wt 63.5 kg   SpO2 100%   BMI 21.29 kg/m    Final Clinical Impressions(s) / ED Diagnoses   Final diagnoses:  Rash and nonspecific skin eruption    ED Discharge Orders         Ordered    predniSONE (DELTASONE) 20 MG tablet     04/25/18 1702    diphenhydrAMINE-zinc acetate (BENADRYL EXTRA STRENGTH) cream  3 times daily PRN     04/25/18 1702         5:01 PM Patient here with nonspecific rash that his body which is been ongoing for about 2 weeks.  He does not have any of anaphylaxis reaction.  Rash does not appear to be infectious.  No precipitating symptoms to attribute to his rash.  Since it is itchy, plan to prescribe a course of steroid as well as Benadryl but encourage patient to follow-up with dermatology for further evaluation.  Recommend return in 48 hours if symptoms worsen.   Domenic Moras, PA-C 04/25/18 1703    Blanchie Dessert, MD 04/25/18 1919

## 2018-04-25 NOTE — Discharge Instructions (Addendum)
Please take steroid and benadryl as prescribed.  Follow up with dermatologist for further evaluation.  Return if you have any concerns.

## 2018-04-25 NOTE — ED Triage Notes (Signed)
Pt reports itchy rash all over his body x2 weeks. Has used aquafor and vaseline at home with no relief.

## 2018-05-05 NOTE — Telephone Encounter (Signed)
rx for kenalog was sent in for pt at the end of Dec 2019 pt another provider.  Please let pt know of Derm appt in February.

## 2018-05-06 NOTE — Telephone Encounter (Signed)
Spoke with pt states voiced understanding that the Rx for an ointment was sent to pharmacy, pt states that he will pick up rx today

## 2018-05-10 ENCOUNTER — Encounter (HOSPITAL_COMMUNITY): Payer: Self-pay | Admitting: *Deleted

## 2018-05-10 ENCOUNTER — Other Ambulatory Visit: Payer: Self-pay

## 2018-05-10 ENCOUNTER — Emergency Department (HOSPITAL_COMMUNITY): Payer: BC Managed Care – PPO

## 2018-05-10 ENCOUNTER — Inpatient Hospital Stay (HOSPITAL_COMMUNITY)
Admission: EM | Admit: 2018-05-10 | Discharge: 2018-05-14 | DRG: 433 | Disposition: A | Discharge status: Home Health | Payer: BC Managed Care – PPO | Attending: Internal Medicine | Admitting: Internal Medicine

## 2018-05-10 DIAGNOSIS — D61818 Other pancytopenia: Secondary | ICD-10-CM | POA: Diagnosis present

## 2018-05-10 DIAGNOSIS — N183 Chronic kidney disease, stage 3 unspecified: Secondary | ICD-10-CM | POA: Diagnosis present

## 2018-05-10 DIAGNOSIS — K7040 Alcoholic hepatic failure without coma: Principal | ICD-10-CM | POA: Diagnosis present

## 2018-05-10 DIAGNOSIS — K7290 Hepatic failure, unspecified without coma: Secondary | ICD-10-CM | POA: Diagnosis not present

## 2018-05-10 DIAGNOSIS — I361 Nonrheumatic tricuspid (valve) insufficiency: Secondary | ICD-10-CM | POA: Diagnosis not present

## 2018-05-10 DIAGNOSIS — N182 Chronic kidney disease, stage 2 (mild): Secondary | ICD-10-CM | POA: Diagnosis not present

## 2018-05-10 DIAGNOSIS — E785 Hyperlipidemia, unspecified: Secondary | ICD-10-CM | POA: Diagnosis present

## 2018-05-10 DIAGNOSIS — R7881 Bacteremia: Secondary | ICD-10-CM | POA: Diagnosis present

## 2018-05-10 DIAGNOSIS — F17210 Nicotine dependence, cigarettes, uncomplicated: Secondary | ICD-10-CM | POA: Diagnosis present

## 2018-05-10 DIAGNOSIS — B9561 Methicillin susceptible Staphylococcus aureus infection as the cause of diseases classified elsewhere: Secondary | ICD-10-CM | POA: Diagnosis present

## 2018-05-10 DIAGNOSIS — K000 Anodontia: Secondary | ICD-10-CM

## 2018-05-10 DIAGNOSIS — I129 Hypertensive chronic kidney disease with stage 1 through stage 4 chronic kidney disease, or unspecified chronic kidney disease: Secondary | ICD-10-CM | POA: Diagnosis present

## 2018-05-10 DIAGNOSIS — K7031 Alcoholic cirrhosis of liver with ascites: Secondary | ICD-10-CM | POA: Diagnosis present

## 2018-05-10 DIAGNOSIS — K219 Gastro-esophageal reflux disease without esophagitis: Secondary | ICD-10-CM | POA: Diagnosis present

## 2018-05-10 DIAGNOSIS — F1010 Alcohol abuse, uncomplicated: Secondary | ICD-10-CM | POA: Diagnosis present

## 2018-05-10 DIAGNOSIS — T22011D Burn of unspecified degree of right forearm, subsequent encounter: Secondary | ICD-10-CM | POA: Diagnosis not present

## 2018-05-10 DIAGNOSIS — I2510 Atherosclerotic heart disease of native coronary artery without angina pectoris: Secondary | ICD-10-CM | POA: Diagnosis present

## 2018-05-10 DIAGNOSIS — K7030 Alcoholic cirrhosis of liver without ascites: Secondary | ICD-10-CM | POA: Diagnosis present

## 2018-05-10 DIAGNOSIS — I252 Old myocardial infarction: Secondary | ICD-10-CM | POA: Diagnosis not present

## 2018-05-10 DIAGNOSIS — F1910 Other psychoactive substance abuse, uncomplicated: Secondary | ICD-10-CM | POA: Diagnosis not present

## 2018-05-10 DIAGNOSIS — D539 Nutritional anemia, unspecified: Secondary | ICD-10-CM | POA: Diagnosis present

## 2018-05-10 DIAGNOSIS — Z8249 Family history of ischemic heart disease and other diseases of the circulatory system: Secondary | ICD-10-CM

## 2018-05-10 DIAGNOSIS — I700 Atherosclerosis of aorta: Secondary | ICD-10-CM | POA: Diagnosis present

## 2018-05-10 DIAGNOSIS — F1110 Opioid abuse, uncomplicated: Secondary | ICD-10-CM | POA: Diagnosis present

## 2018-05-10 DIAGNOSIS — B955 Unspecified streptococcus as the cause of diseases classified elsewhere: Secondary | ICD-10-CM | POA: Diagnosis not present

## 2018-05-10 DIAGNOSIS — D509 Iron deficiency anemia, unspecified: Secondary | ICD-10-CM | POA: Diagnosis present

## 2018-05-10 DIAGNOSIS — K766 Portal hypertension: Secondary | ICD-10-CM | POA: Diagnosis present

## 2018-05-10 DIAGNOSIS — I340 Nonrheumatic mitral (valve) insufficiency: Secondary | ICD-10-CM | POA: Diagnosis present

## 2018-05-10 DIAGNOSIS — R160 Hepatomegaly, not elsewhere classified: Secondary | ICD-10-CM | POA: Diagnosis not present

## 2018-05-10 DIAGNOSIS — D684 Acquired coagulation factor deficiency: Secondary | ICD-10-CM | POA: Diagnosis present

## 2018-05-10 DIAGNOSIS — Z7952 Long term (current) use of systemic steroids: Secondary | ICD-10-CM

## 2018-05-10 DIAGNOSIS — K08109 Complete loss of teeth, unspecified cause, unspecified class: Secondary | ICD-10-CM | POA: Diagnosis present

## 2018-05-10 DIAGNOSIS — F1210 Cannabis abuse, uncomplicated: Secondary | ICD-10-CM | POA: Diagnosis present

## 2018-05-10 DIAGNOSIS — Z8673 Personal history of transient ischemic attack (TIA), and cerebral infarction without residual deficits: Secondary | ICD-10-CM | POA: Diagnosis not present

## 2018-05-10 DIAGNOSIS — Z96651 Presence of right artificial knee joint: Secondary | ICD-10-CM | POA: Diagnosis present

## 2018-05-10 DIAGNOSIS — I959 Hypotension, unspecified: Secondary | ICD-10-CM | POA: Diagnosis present

## 2018-05-10 DIAGNOSIS — Z833 Family history of diabetes mellitus: Secondary | ICD-10-CM

## 2018-05-10 DIAGNOSIS — X088XXD Exposure to other specified smoke, fire and flames, subsequent encounter: Secondary | ICD-10-CM | POA: Diagnosis not present

## 2018-05-10 DIAGNOSIS — F1310 Sedative, hypnotic or anxiolytic abuse, uncomplicated: Secondary | ICD-10-CM | POA: Diagnosis present

## 2018-05-10 DIAGNOSIS — R21 Rash and other nonspecific skin eruption: Secondary | ICD-10-CM | POA: Diagnosis not present

## 2018-05-10 DIAGNOSIS — K7682 Hepatic encephalopathy: Secondary | ICD-10-CM | POA: Diagnosis present

## 2018-05-10 DIAGNOSIS — L299 Pruritus, unspecified: Secondary | ICD-10-CM | POA: Diagnosis not present

## 2018-05-10 DIAGNOSIS — R4182 Altered mental status, unspecified: Secondary | ICD-10-CM | POA: Diagnosis present

## 2018-05-10 HISTORY — DX: Dehydration: E86.0

## 2018-05-10 HISTORY — DX: Encounter for preprocedural cardiovascular examination: Z01.810

## 2018-05-10 HISTORY — DX: Systemic inflammatory response syndrome (sirs) of non-infectious origin without acute organ dysfunction: R65.10

## 2018-05-10 HISTORY — DX: Sepsis, unspecified organism: A41.9

## 2018-05-10 HISTORY — DX: Fever, unspecified: R50.9

## 2018-05-10 HISTORY — DX: Severe sepsis with septic shock: R65.21

## 2018-05-10 HISTORY — DX: Hematemesis: K92.0

## 2018-05-10 LAB — BLOOD CULTURE ID PANEL (REFLEXED)

## 2018-05-10 LAB — URINALYSIS, ROUTINE W REFLEX MICROSCOPIC
Bacteria, UA: NONE SEEN
Bilirubin Urine: NEGATIVE
Glucose, UA: NEGATIVE mg/dL
Ketones, ur: NEGATIVE mg/dL
Leukocytes, UA: NEGATIVE
Nitrite: NEGATIVE
Protein, ur: NEGATIVE mg/dL
Specific Gravity, Urine: 1.01 (ref 1.005–1.030)
pH: 5 (ref 5.0–8.0)

## 2018-05-10 LAB — AMMONIA: Ammonia: 203 umol/L — ABNORMAL HIGH (ref 9–35)

## 2018-05-10 LAB — COMPREHENSIVE METABOLIC PANEL
ALT: 23 U/L (ref 0–44)
ALT: 28 U/L (ref 0–44)
AST: 52 U/L — ABNORMAL HIGH (ref 15–41)
AST: 62 U/L — ABNORMAL HIGH (ref 15–41)
Albumin: 2.1 g/dL — ABNORMAL LOW (ref 3.5–5.0)
Albumin: 2.2 g/dL — ABNORMAL LOW (ref 3.5–5.0)
Alkaline Phosphatase: 87 U/L (ref 38–126)
Alkaline Phosphatase: 91 U/L (ref 38–126)
Anion gap: 8 (ref 5–15)
Anion gap: 9 (ref 5–15)
BUN: 15 mg/dL (ref 8–23)
BUN: 14 mg/dL (ref 8–23)
CO2: 17 mmol/L — ABNORMAL LOW (ref 22–32)
CO2: 17 mmol/L — ABNORMAL LOW (ref 22–32)
Calcium: 8 mg/dL — ABNORMAL LOW (ref 8.9–10.3)
Calcium: 8.1 mg/dL — ABNORMAL LOW (ref 8.9–10.3)
Chloride: 114 mmol/L — ABNORMAL HIGH (ref 98–111)
Chloride: 112 mmol/L — ABNORMAL HIGH (ref 98–111)
Creatinine, Ser: 1.58 mg/dL — ABNORMAL HIGH (ref 0.61–1.24)
Creatinine, Ser: 1.55 mg/dL — ABNORMAL HIGH (ref 0.61–1.24)
GFR calc Af Amer: 53 mL/min — ABNORMAL LOW (ref >60)
GFR calc Af Amer: 54 mL/min — ABNORMAL LOW (ref >60)
GFR calc non Af Amer: 46 mL/min — ABNORMAL LOW (ref >60)
GFR calc non Af Amer: 47 mL/min — ABNORMAL LOW (ref >60)
Glucose, Bld: 87 mg/dL (ref 70–99)
Glucose, Bld: 81 mg/dL (ref 70–99)
Potassium: 4.8 mmol/L (ref 3.5–5.1)
Potassium: 5.1 mmol/L (ref 3.5–5.1)
Sodium: 139 mmol/L (ref 135–145)
Sodium: 138 mmol/L (ref 135–145)
Total Bilirubin: 2.9 mg/dL — ABNORMAL HIGH (ref 0.3–1.2)
Total Bilirubin: 3.8 mg/dL — ABNORMAL HIGH (ref 0.3–1.2)
Total Protein: 6 g/dL — ABNORMAL LOW (ref 6.5–8.1)
Total Protein: 6.4 g/dL — ABNORMAL LOW (ref 6.5–8.1)

## 2018-05-10 LAB — LACTIC ACID, PLASMA: Lactic Acid, Venous: 2.9 mmol/L (ref 0.5–1.9)

## 2018-05-10 LAB — CBC WITH DIFFERENTIAL/PLATELET
Abs Immature Granulocytes: 0.01 10*3/uL (ref 0.00–0.07)
Basophils Absolute: 0 10*3/uL (ref 0.0–0.1)
Basophils Relative: 1 %
Eosinophils Absolute: 0.2 10*3/uL (ref 0.0–0.5)
Eosinophils Relative: 7 %
HCT: 29.9 % — ABNORMAL LOW (ref 39.0–52.0)
Hemoglobin: 9.9 g/dL — ABNORMAL LOW (ref 13.0–17.0)
Immature Granulocytes: 0 %
Lymphocytes Relative: 20 %
Lymphs Abs: 0.6 10*3/uL — ABNORMAL LOW (ref 0.7–4.0)
MCH: 35.1 pg — ABNORMAL HIGH (ref 26.0–34.0)
MCHC: 33.1 g/dL (ref 30.0–36.0)
MCV: 106 fL — ABNORMAL HIGH (ref 80.0–100.0)
Monocytes Absolute: 0.3 10*3/uL (ref 0.1–1.0)
Monocytes Relative: 11 %
Neutro Abs: 1.7 10*3/uL (ref 1.7–7.7)
Neutrophils Relative %: 61 %
Platelets: 75 10*3/uL — ABNORMAL LOW (ref 150–400)
RBC: 2.82 MIL/uL — ABNORMAL LOW (ref 4.22–5.81)
RDW: 14.9 % (ref 11.5–15.5)
WBC: 2.8 10*3/uL — ABNORMAL LOW (ref 4.0–10.5)
nRBC: 0 % (ref 0.0–0.2)

## 2018-05-10 LAB — MRSA PCR SCREENING: MRSA by PCR: NEGATIVE

## 2018-05-10 LAB — RAPID URINE DRUG SCREEN, HOSP PERFORMED
Amphetamines: NOT DETECTED
Barbiturates: NOT DETECTED
Benzodiazepines: POSITIVE — AB
Cocaine: NOT DETECTED
Opiates: POSITIVE — AB
Tetrahydrocannabinol: NOT DETECTED

## 2018-05-10 LAB — APTT: aPTT: 39 seconds — ABNORMAL HIGH (ref 24–36)

## 2018-05-10 LAB — PROTIME-INR
INR: 1.47
Prothrombin Time: 17.6 seconds — ABNORMAL HIGH (ref 11.4–15.2)

## 2018-05-10 LAB — ETHANOL: Alcohol, Ethyl (B): <10 mg/dL (ref <10)

## 2018-05-10 MED ORDER — ONDANSETRON HCL 4 MG PO TABS: 4.0000 mg | ORAL_TABLET | Freq: Four times a day (QID) | ORAL | Status: DC | PRN

## 2018-05-10 MED ORDER — SODIUM CHLORIDE 0.9 % IV BOLUS (SEPSIS): 1000.0000 mL | Freq: Once | INTRAVENOUS | Status: DC

## 2018-05-10 MED ORDER — ONDANSETRON HCL 4 MG/2ML IJ SOLN
4.0000 mg | Freq: Four times a day (QID) | INTRAMUSCULAR | Status: DC | PRN
  Administered 2018-05-11: 4 mg via INTRAVENOUS
  Filled 2018-05-10: qty 2

## 2018-05-10 MED ORDER — FAMOTIDINE IN NACL 20-0.9 MG/50ML-% IV SOLN
20.0000 mg | Freq: Two times a day (BID) | INTRAVENOUS | Status: DC
  Administered 2018-05-10 (×2): 20 mg via INTRAVENOUS
  Filled 2018-05-10 (×3): qty 50

## 2018-05-10 MED ORDER — ALBUMIN HUMAN 25 % IV SOLN
100.0000 g | Freq: Once | INTRAVENOUS | Status: AC
  Administered 2018-05-10: 100 g via INTRAVENOUS
  Filled 2018-05-10: qty 400

## 2018-05-10 MED ORDER — LACTULOSE ENEMA
300.0000 mL | Freq: Three times a day (TID) | ORAL | Status: DC
  Filled 2018-05-10: qty 300

## 2018-05-10 MED ORDER — SODIUM CHLORIDE 0.9 % IV SOLN
1.0000 g | INTRAVENOUS | Status: DC
  Administered 2018-05-10: 1 g via INTRAVENOUS
  Filled 2018-05-10: qty 10

## 2018-05-10 MED ORDER — SODIUM CHLORIDE 0.9 % IV SOLN
INTRAVENOUS | Status: DC
  Administered 2018-05-10: 08:00:00 via INTRAVENOUS

## 2018-05-10 MED ORDER — CEFAZOLIN SODIUM-DEXTROSE 2-4 GM/100ML-% IV SOLN
2.0000 g | Freq: Three times a day (TID) | INTRAVENOUS | Status: DC
  Administered 2018-05-10 – 2018-05-14 (×12): 2 g via INTRAVENOUS
  Filled 2018-05-10 (×12): qty 100

## 2018-05-10 MED ORDER — LACTULOSE ENEMA
300.0000 mL | Freq: Three times a day (TID) | ORAL | Status: DC
  Administered 2018-05-10 (×3): 300 mL via RECTAL
  Filled 2018-05-10 (×4): qty 300

## 2018-05-10 MED ORDER — SODIUM CHLORIDE 0.9 % IV SOLN: 1000.0000 mL | INTRAVENOUS | Status: DC

## 2018-05-10 NOTE — ED Triage Notes (Signed)
EMS called out for AMS; on arrival, pt was very combative, not allowing EMS to help at all. [employer] called to scene to help administer 2.5mg  Haldol, and 5mg  versed. Hx of cirrhosis. Wife told EMS pt has acted like this before with high ammonia level

## 2018-05-10 NOTE — [place] Notes (Signed)
PHARMACY - PHYSICIAN COMMUNICATION CRITICAL VALUE ALERT - BLOOD CULTURE IDENTIFICATION (BCID)  Mark [name] is an 63 y.o. male who presented to [facility] on 05/10/2018 with a chief complaint of AMS.  Assessment:  BCID showed MSSA in 3 of 4 bottles.  Not on any antibiotics currently.  Afebrile, WBC 2.8, LA 2.9.  Name of physician (or Provider) Contacted: Tylene Fantasia  Current antibiotics: N/A  Changes to prescribed antibiotics recommended:  Recommendations accepted by provider  Start Ancef  Results for orders placed or performed during the hospital encounter of 05/10/18  Blood Culture ID Panel (Reflexed) (Collected: 05/10/2018  4:52 AM)  Result Value Ref Range   Enterococcus species NOT DETECTED NOT DETECTED   Listeria monocytogenes NOT DETECTED NOT DETECTED   Staphylococcus species DETECTED (A) NOT DETECTED   Staphylococcus aureus (BCID) DETECTED (A) NOT DETECTED   Methicillin resistance NOT DETECTED NOT DETECTED   Streptococcus species NOT DETECTED NOT DETECTED   Streptococcus agalactiae NOT DETECTED NOT DETECTED   Streptococcus pneumoniae NOT DETECTED NOT DETECTED   Streptococcus pyogenes NOT DETECTED NOT DETECTED   Acinetobacter baumannii NOT DETECTED NOT DETECTED   Enterobacteriaceae species NOT DETECTED NOT DETECTED   Enterobacter cloacae complex NOT DETECTED NOT DETECTED   Escherichia coli NOT DETECTED NOT DETECTED   Klebsiella oxytoca NOT DETECTED NOT DETECTED   Klebsiella pneumoniae NOT DETECTED NOT DETECTED   Proteus species NOT DETECTED NOT DETECTED   Serratia marcescens NOT DETECTED NOT DETECTED   Haemophilus influenzae NOT DETECTED NOT DETECTED   Neisseria meningitidis NOT DETECTED NOT DETECTED   Pseudomonas aeruginosa NOT DETECTED NOT DETECTED   Candida albicans NOT DETECTED NOT DETECTED   Candida glabrata NOT DETECTED NOT DETECTED   Candida krusei NOT DETECTED NOT DETECTED   Candida parapsilosis NOT DETECTED NOT DETECTED   Candida tropicalis NOT DETECTED  NOT DETECTED     [name] D. Mina Marble, PharmD, BCPS, [place] 05/10/2018, 9:26 PM

## 2018-05-10 NOTE — H&P (Signed)
History and Physical    Mark [name] ALP:[id number] DOB: June 27, 1955 DOA: 05/10/2018  Referring MD/NP/PA:   PCP: [name], MD   Patient coming from:  The patient is coming from home.  At baseline, pt is partially dependent for most of ADL.        Chief Complaint: AMS  HPI: Mark [name] is a 63 y.o. male with medical history significant of alcoholic liver cirrhosis with ascites, pancytopenia, hypertension, stroke, cardiac, CAD, polysubstance abuse (alcohol, heroin, marijuana), CKD-3, who presents with altered mental status.  Patient has AMS, and is unable to provide accurate medical history, therefore, most of the history is obtained by discussing the case with ED physician, per EMS report, and with the nursing staff. Per report, pt is confused and agitated at home today. At EMS arrival, pt was very combative, not allowing EMS to help at all. [employer] called to scene to help administer 2.5mg  Haldol, and 5mg  versed. When I saw pt in ED, he is confused, not following commands, not orientated x3.  He moves all extremities.  No active nausea vomiting, diarrhea, respiratory distress, cough noted.  Not sure if patient has any pain.  No facial droop.  Patient initially had hypotension with blood pressure 71/60, which improved to 137/79 without any treatment in ED.  ED Course: pt was found to have ammonia level 2 3, alcohol level less than 10, worsening renal function, pancytopenia (WBC 2.8, hemoglobin 9.9, platelets 75), temperature 96.9, tachycardia, oxygen saturation 100% on room air, negative chest x-ray.  CT head negative for acute intracranial abnormalities.  Review of Systems: Could not be reviewed due to altered mental status.  Allergy: No Known Allergies  Past Medical History:  Diagnosis Date  . Alcohol abuse   . Anemia   . Arthritis   . Ascites   . Cirrhosis ([place])   . Heart murmur   . Hyperlipidemia   . Hypertension   . Leg swelling   . Myocardial infarction ([place])  2012  . Stroke [facility]) 2012   no deficits  . Thrombocytopenia ([place])     Past Surgical History:  Procedure Laterality Date  . COLONOSCOPY WITH PROPOFOL N/A 02/07/2016   Procedure: COLONOSCOPY WITH PROPOFOL;  Surgeon: Milus Banister, MD;  Location: [facility] ENDOSCOPY;  Service: Endoscopy;  Laterality: N/A;  . ESOPHAGOGASTRODUODENOSCOPY (EGD) WITH PROPOFOL N/A 02/07/2016   Procedure: ESOPHAGOGASTRODUODENOSCOPY (EGD) WITH PROPOFOL;  Surgeon: Milus Banister, MD;  Location: [facility] ENDOSCOPY;  Service: Endoscopy;  Laterality: N/A;  . ESOPHAGOGASTRODUODENOSCOPY (EGD) WITH PROPOFOL N/A 06/22/2017   Procedure: ESOPHAGOGASTRODUODENOSCOPY (EGD) WITH PROPOFOL;  Surgeon: Jerene Bears, MD;  Location: [facility] ENDOSCOPY;  Service: Gastroenterology;  Laterality: N/A;  . HERNIA REPAIR Right    inguinal  . KNEE ARTHROSCOPY     bilateral/  12/14  . TOTAL KNEE ARTHROPLASTY Right 05/02/2013   Procedure: RIGHT TOTAL KNEE ARTHROPLASTY;  Surgeon: Mauri Pole, MD;  Location: [facility] ORS;  Service: Orthopedics;  Laterality: Right;    Social History:  reports that he quit smoking about 2 years ago. His smoking use included cigarettes. He has a 5.00 pack-year smoking history. He has never used smokeless tobacco. He reports current drug use. Drugs: Marijuana and Heroin. He reports that he does not drink alcohol.  Family History:  Family History  Problem Relation Age of Onset  . Hypertension Father   . Diabetes Father   . Dementia Mother   . Lupus Sister      Prior to Admission medications  Medication Sig Start Date End Date Taking? Authorizing Provider  albuterol (PROVENTIL HFA;VENTOLIN HFA) 108 (90 Base) MCG/ACT inhaler Inhale 2 puffs into the lungs every 6 (six) hours as needed for wheezing or shortness of breath. 09/16/17   [name], MD  diphenhydrAMINE-zinc acetate (BENADRYL EXTRA STRENGTH) cream Apply 1 application topically 3 (three) times daily as needed for itching. 04/25/18   Domenic Moras, PA-C  folic acid  (FOLVITE) 1 MG tablet TAKE 1 TABLET BY MOUTH EVERY DAY 03/19/18   [name], MD  furosemide (LASIX) 20 MG tablet TAKE 1 TABLET BY MOUTH TWICE DAILY 04/19/18   [name], MD  guaiFENesin (MUCINEX) 600 MG 12 hr tablet Take 1 tablet (600 mg total) by mouth 2 (two) times daily as needed for cough or to loosen phlegm. 07/14/17   Rosita Fire, MD  lactulose (CHRONULAC) 10 GM/15ML solution Take 45 mLs (30 g total) by mouth 2 (two) times daily. 12/03/17   [name], MD  nadolol (CORGARD) 20 MG tablet TAKE 1 TABLET BY MOUTH EVERY DAY 02/17/18   [name], MD  pantoprazole (PROTONIX) 40 MG tablet TAKE 1 TABLET(40 MG) BY MOUTH DAILY 03/03/18   [name], MD  predniSONE (DELTASONE) 20 MG tablet 3 tabs po day one, then 2 tabs daily x 4 days 04/25/18   Domenic Moras, PA-C  silver sulfADIAZINE (SILVADENE) 1 % cream Apply 1 application topically daily. To  Burn 04/19/18   Panosh, Standley Brooking, MD  spironolactone (ALDACTONE) 50 MG tablet Take 2 tablets (100 mg total) by mouth 2 (two) times daily. 04/19/18   Levin Erp, PA  thiamine 100 MG tablet Take 1 tablet (100 mg total) by mouth daily. 12/04/17   [name], MD  triamcinolone (KENALOG) 0.025 % ointment Apply 1 application topically 2 (two) times daily. To dry skin rash .Limit use on face to  10 days 04/20/18   Burnis Medin, MD    Physical Exam: Vitals:   05/10/18 0400 05/10/18 0430 05/10/18 0445 05/10/18 0515  BP: (!) 118/56 137/79 125/69 133/77  Pulse: 72 (!) 112 97 99  Resp: 16 (!) 24 19 20   Temp:      TempSrc:      SpO2: 100% 100% 100% 100%   General: Not in acute distress HEENT:       Eyes: PERRL, EOMI, no scleral icterus.       ENT: No discharge from the ears and nose, no pharynx injection, no tonsillar enlargement.        Neck: No JVD, no bruit, no mass felt. Heme: No neck lymph node enlargement. Cardiac: S1/S2, RRR, No murmurs, No gallops or rubs. Respiratory: No rales, wheezing, rhonchi or  rubs. GI: Soft, nondistended, nontender, no organomegaly, BS present. GU: No hematuria Ext: 2+ pitting leg edema bilaterally. 2+DP/PT pulse bilaterally. Musculoskeletal: No joint deformities, No joint redness or warmth, no limitation of ROM in spin. Skin: No rashes.  Neuro: Confused, not following command, not oriented X3, cranial nerves II-XII grossly intact, moves all extremities. Psych: Patient was combative Labs on Admission: I have personally reviewed following labs and imaging studies  CBC: Recent Labs  Lab 05/10/18 0042  WBC 2.8*  NEUTROABS 1.7  HGB 9.9*  HCT 29.9*  MCV 106.0*  PLT 75*   Basic Metabolic Panel: Recent Labs  Lab 05/10/18 0042  NA 139  K 4.8  CL 114*  CO2 17*  GLUCOSE 87  BUN 15  CREATININE 1.58*  CALCIUM  8.0*   GFR: CrCl cannot be calculated (Unknown ideal weight.). Liver Function Tests: Recent Labs  Lab 05/10/18 0042  AST 52*  ALT 23  ALKPHOS 87  BILITOT 2.9*  PROT 6.0*  ALBUMIN 2.1*   No results for input(s): LIPASE, AMYLASE in the last 168 hours. Recent Labs  Lab 05/10/18 0042  AMMONIA 203*   Coagulation Profile: Recent Labs  Lab 05/10/18 0044  INR 1.47   Cardiac Enzymes: No results for input(s): CKTOTAL, CKMB, CKMBINDEX, TROPONINI in the last 168 hours. BNP (last 3 results) No results for input(s): PROBNP in the last 8760 hours. HbA1C: No results for input(s): HGBA1C in the last 72 hours. CBG: No results for input(s): GLUCAP in the last 168 hours. Lipid Profile: No results for input(s): CHOL, HDL, LDLCALC, TRIG, CHOLHDL, LDLDIRECT in the last 72 hours. Thyroid Function Tests: No results for input(s): TSH, T4TOTAL, FREET4, T3FREE, THYROIDAB in the last 72 hours. Anemia Panel: No results for input(s): VITAMINB12, FOLATE, FERRITIN, TIBC, IRON, RETICCTPCT in the last 72 hours. Urine analysis:    Component Value Date/Time   COLORURINE YELLOW 05/10/2018 0310   APPEARANCEUR CLEAR 05/10/2018 0310   LABSPEC 1.010  05/10/2018 0310   PHURINE 5.0 05/10/2018 0310   GLUCOSEU NEGATIVE 05/10/2018 0310   HGBUR SMALL (A) 05/10/2018 0310   BILIRUBINUR NEGATIVE 05/10/2018 0310   KETONESUR NEGATIVE 05/10/2018 0310   PROTEINUR NEGATIVE 05/10/2018 0310   UROBILINOGEN 2.0 (H) 04/26/2013 0945   NITRITE NEGATIVE 05/10/2018 0310   LEUKOCYTESUR NEGATIVE 05/10/2018 0310   Sepsis Labs: @LABRCNTIP (procalcitonin:4,lacticidven:4) ) Recent Results (from the past 240 hour(s))  Culture, blood (Routine X 2) w Reflex to ID Panel     Status: None (Preliminary result)   Collection Time: 05/10/18  4:52 AM  Result Value Ref Range Status   Specimen Description BLOOD RIGHT ANTECUBITAL  Final   Special Requests   Final    BOTTLES DRAWN AEROBIC AND ANAEROBIC Blood Culture adequate volume   Culture PENDING  Incomplete   Report Status PENDING  Incomplete     Radiological Exams on Admission: Ct Head Wo Contrast  Result Date: 05/10/2018 CLINICAL DATA:  Altered mental status/confusion EXAM: CT HEAD WITHOUT CONTRAST TECHNIQUE: Contiguous axial images were obtained from the base of the skull through the vertex without intravenous contrast. COMPARISON:  MR brain dated 11/02/2009 FINDINGS: Brain: No evidence of acute infarction, hemorrhage, hydrocephalus, extra-axial collection or mass lesion/mass effect. Subcortical white matter and periventricular small vessel ischemic changes. Vascular: No hyperdense vessel or unexpected calcification. Skull: Normal. Negative for fracture or focal lesion. Sinuses/Orbits: The visualized paranasal sinuses are essentially clear. The mastoid air cells are unopacified. Other: None. IMPRESSION: No evidence of acute intracranial abnormality. Small vessel ischemic changes. Electronically Signed   By: Julian Hy M.D.   On: 05/10/2018 02:27   Dg Chest Port 1 View  Result Date: 05/10/2018 CLINICAL DATA:  Mental status changes. EXAM: PORTABLE CHEST 1 VIEW COMPARISON:  07/10/2017 FINDINGS: Normal heart size  and pulmonary vascularity. No focal airspace disease or consolidation in the lungs. No blunting of costophrenic angles. No pneumothorax. Mediastinal contours appear intact. Degenerative changes in the shoulders. Old left rib fractures. IMPRESSION: No active disease. Electronically Signed   By: Lucienne Capers M.D.   On: 05/10/2018 01:01     EKG: Independently reviewed.  Sinus rhythm, QTC 467, anteroseptal infarction pattern, nonspecific T wave change.  Assessment/Plan Principal Problem:   Hepatic encephalopathy ([name]) Active Problems:   Cirrhosis, alcoholic ([place])   Substance abuse ([place])  CKD (chronic kidney disease) stage 3, GFR 30-59 ml/min ([name])   Hypotension   Pancytopenia ([name])   CAD (coronary artery disease)   Alcohol abuse   GERD (gastroesophageal reflux disease)   Hepatic encephalopathy [facility]): Patient has altered mental status, no focal neurological findings on physical examination.  CT head is negative.  Ammonium is elevated 203, clinically consistent with hepatic encephalopathy.  -will admit to SDU -restart lactulose 30 g tid -Frequent neuro check - will start IV Rocephin and follow-up blood culture -Give 100 g of albumin IV -hold all oral meds and keep pt NPO until mental status improves  Cirrhosis, alcoholic ([place]): -check INR and PTT  Substance abuse ([place]): -check UDS  CKD (chronic kidney disease) stage 3, GFR 30-59 ml/min ([place]): Slightly worsening.  Baseline creatinine 1.0-1.3 recently.  His creatinine is 1.58, BUN 15.  Likely due to decreased oral intake, and continuation of diuretics. -Hold Lasix and spironolactone  Hypotension: Initial blood pressure 71/60, which improved to 137/79 without any treatment or IV fluid.  Likely due to sedative medication which were given by EMS. -Observe closely  Pancytopenia ([place]): This is a chronic issue, likely due to cirrhosis.  Hemoglobin stable.  9.4 on 12/03/2017,--> 9.9 today. -Follow-up with CBC  Hx of CAD (coronary  artery disease): Patient does not seem to have any pain or chest pain.  -Observe closely.  HTN: -hold nadolol which is also for liver cirrhosis  Hx of Alcohol abuse: not sure if pt is still drinking alcohol or not.  Alcohol level less than 10.  His wife is coming in this morning.  Currently patient has altered mental status. -Observe any signs of withdrawal, if he develops withdrawal--> will start CIWA protocol.  (Not ordered yet).  GERD: -Pepcid IV      Inpatient status:  # Patient requires inpatient status due to high intensity of service, high risk for further deterioration and high frequency of surveillance required.  I certify that at the point of admission it is my clinical judgment that the patient will require inpatient hospital care spanning beyond 2 midnights from the point of admission.  . This patient has multiple chronic comorbidities including*alcoholic liver cirrhosis with ascites, pancytopenia, hypertension, stroke, cardiac, CAD, polysubstance abuse (alcohol, heroin, marijuana), CKD-3 . Now patient has presenting symptoms include AMS likely due to hepatic encephalopathy . The worrisome physical exam findings include altered mental status, leg edema, hypotension . The initial radiographic and laboratory data are worrisome because of worsening renal function, elevated ammonia level, pancytopenia . Current medical needs: please see my assessment and plan . Predictability of an adverse outcome (risk): Patient has multiple comorbidities, now presents with altered mental status which is likely due to hepatic encephalopathy.  Patient is at high risk of deteriorating.  Will need to stay in hospital for at least 2 days.    DVT ppx: SCD Code Status: Full code Family Communication: None at bed side.    Disposition Plan:  Anticipate discharge back to previous home environment Consults called:  none Admission status:   SDU/inpation       Date of Service 05/10/2018    Ivor Costa [facility] Pager [phone]  If 7PM-7AM, please contact night-coverage [email or web address] Password [facility] 05/10/2018, 5:38 AM

## 2018-05-10 NOTE — [place] Notes (Addendum)
[facility]  Mark [name] is a 63 y.o. male with medical history significant of alcoholic liver cirrhosis with ascites, pancytopenia, hypertension, stroke, cardiac, CAD, polysubstance abuse (alcohol, heroin, marijuana), CKD-3, who presents with altered mental status. EMS was called for confusion and he was noted to be combative. 2.5mg  Haldol and 5mg  versed administered by EMS last night. Ammonia level 203 I have examined him today. He is very lethargic.   Principal Problem:   Hepatic encephalopathy/ lethargic - head CT negative - lactulose enemas ordered- NPO until he regains consciousness- cont slow NS at 75 cc/hr while NPO  Active Problems: CKD3 - Cr ranges from 0.9-1.4 at baseline   Alcohol abuse-  Cirrhosis, alcoholic   Acquired coagulopathy- INR 1.l47 - Nadolol on hold while lethargic    Substance abuse  - UDS + for narcotics and Benzodiazepines    Pancytopenia - chronic and stable     GERD (gastroesophageal reflux disease) -  On IV Pepcid currently   Debbe Odea, MD

## 2018-05-10 NOTE — ED Provider Notes (Signed)
[facility] EMERGENCY DEPARTMENT Provider Note   CSN: [id number] Arrival date & time: 05/10/18  8099     History   Chief Complaint Chief Complaint  Patient presents with  . Altered Mental Status    HPI Mark [name] is a 63 y.o. male.  Patient brought to the emergency department for evaluation of altered mental status.  Patient reportedly was acting confused and agitated at home earlier.  Patient's wife called EMS.  Wife told EMS that he was acting like he normally does when his ammonia gets high secondary to his liver cirrhosis.  EMS report that when they got on the scene the patient was agitated and combative, required chemical sedation with Ativan and Haldol in order for them to transport.  At arrival to the ER, patient is somnolent. Level V Caveat due to mental status change.     Past Medical History:  Diagnosis Date  . Alcohol abuse   . Anemia   . Arthritis   . Ascites   . Cirrhosis ([place])   . Heart murmur   . Hyperlipidemia   . Hypertension   . Leg swelling   . Myocardial infarction ([place]) 2012  . Stroke [facility]) 2012   no deficits  . Thrombocytopenia [facility])     Patient Active Problem List   Diagnosis Date Noted  . SIRS (systemic inflammatory response syndrome) ([place]) 07/11/2017  . Febrile illness   . Duodenal ulcer   . Renal failure   . Cirrhosis ([place])   . Coffee ground emesis   . Acute on chronic anemia   . Septic shock ([place]) 06/18/2017  . Dehydration 06/17/2017  . Hypotension 06/17/2017  . Acute on chronic renal failure ([place]) 06/17/2017  . Hyponatremia 06/17/2017  . Thrombocytopenia ([place]) 06/17/2017  . Sepsis ([place]) 06/17/2017  . Leg edema, right 06/17/2017  . Acute metabolic encephalopathy 83/38/2505  . Anemia, iron deficiency   . Benign neoplasm of ascending colon   . Hemorrhoids   . Portal hypertensive gastropathy ([place])   . Gastritis and gastroduodenitis   . Esophageal varices without bleeding ([place])   . H/O: CVA  (cerebrovascular accident) 12/01/2013  . ACS (acute coronary syndrome) ([place]) 12/01/2013  . Anasarca 12/01/2013  . Substance abuse ([place]) 12/01/2013  . CKD (chronic kidney disease) stage 3, GFR 30-59 ml/min ([name]) 12/01/2013  . S/P right TKA 05/02/2013  . Preop cardiovascular exam 04/14/2013  . Murmur 04/14/2013  . Right inguinal hernia 12/26/2010  . Cirrhosis, alcoholic ([place]) 39/76/7341    Past Surgical History:  Procedure Laterality Date  . COLONOSCOPY WITH PROPOFOL N/A 02/07/2016   Procedure: COLONOSCOPY WITH PROPOFOL;  Surgeon: Milus Banister, MD;  Location: [facility] ENDOSCOPY;  Service: Endoscopy;  Laterality: N/A;  . ESOPHAGOGASTRODUODENOSCOPY (EGD) WITH PROPOFOL N/A 02/07/2016   Procedure: ESOPHAGOGASTRODUODENOSCOPY (EGD) WITH PROPOFOL;  Surgeon: Milus Banister, MD;  Location: [facility] ENDOSCOPY;  Service: Endoscopy;  Laterality: N/A;  . ESOPHAGOGASTRODUODENOSCOPY (EGD) WITH PROPOFOL N/A 06/22/2017   Procedure: ESOPHAGOGASTRODUODENOSCOPY (EGD) WITH PROPOFOL;  Surgeon: Jerene Bears, MD;  Location: [facility] ENDOSCOPY;  Service: Gastroenterology;  Laterality: N/A;  . HERNIA REPAIR Right    inguinal  . KNEE ARTHROSCOPY     bilateral/  12/14  . TOTAL KNEE ARTHROPLASTY Right 05/02/2013   Procedure: RIGHT TOTAL KNEE ARTHROPLASTY;  Surgeon: Mauri Pole, MD;  Location: [facility] ORS;  Service: Orthopedics;  Laterality: Right;        Home Medications    Prior to Admission medications   Medication Sig Start Date  End Date Taking? Authorizing Provider  albuterol (PROVENTIL HFA;VENTOLIN HFA) 108 (90 Base) MCG/ACT inhaler Inhale 2 puffs into the lungs every 6 (six) hours as needed for wheezing or shortness of breath. 09/16/17   [name], MD  diphenhydrAMINE-zinc acetate (BENADRYL EXTRA STRENGTH) cream Apply 1 application topically 3 (three) times daily as needed for itching. 04/25/18   Domenic Moras, PA-C  folic acid (FOLVITE) 1 MG tablet TAKE 1 TABLET BY MOUTH EVERY DAY 03/19/18   [name], MD    furosemide (LASIX) 20 MG tablet TAKE 1 TABLET BY MOUTH TWICE DAILY 04/19/18   [name], MD  guaiFENesin (MUCINEX) 600 MG 12 hr tablet Take 1 tablet (600 mg total) by mouth 2 (two) times daily as needed for cough or to loosen phlegm. 07/14/17   Rosita Fire, MD  lactulose (CHRONULAC) 10 GM/15ML solution Take 45 mLs (30 g total) by mouth 2 (two) times daily. 12/03/17   [name], MD  nadolol (CORGARD) 20 MG tablet TAKE 1 TABLET BY MOUTH EVERY DAY 02/17/18   [name], MD  pantoprazole (PROTONIX) 40 MG tablet TAKE 1 TABLET(40 MG) BY MOUTH DAILY 03/03/18   [name], MD  predniSONE (DELTASONE) 20 MG tablet 3 tabs po day one, then 2 tabs daily x 4 days 04/25/18   Domenic Moras, PA-C  silver sulfADIAZINE (SILVADENE) 1 % cream Apply 1 application topically daily. To  Burn 04/19/18   Panosh, Standley Brooking, MD  spironolactone (ALDACTONE) 50 MG tablet Take 2 tablets (100 mg total) by mouth 2 (two) times daily. 04/19/18   Levin Erp, PA  thiamine 100 MG tablet Take 1 tablet (100 mg total) by mouth daily. 12/04/17   [name], MD  triamcinolone (KENALOG) 0.025 % ointment Apply 1 application topically 2 (two) times daily. To dry skin rash .Limit use on face to  10 days 04/20/18   Panosh, Standley Brooking, MD    Family History Family History  Problem Relation Age of Onset  . Hypertension Father   . Diabetes Father   . Dementia Mother   . Lupus Sister     Social History Social History   Tobacco Use  . Smoking status: Former Smoker    Packs/day: 0.50    Years: 10.00    Pack years: 5.00    Types: Cigarettes    Last attempt to quit: 01/05/2016    Years since quitting: 2.3  . Smokeless tobacco: Never Used  . Tobacco comment: 4 cigarettes a day  Substance Use Topics  . Alcohol use: No    Comment: 40 oz per week as of 01-19-17  . Drug use: Yes    Types: Marijuana, Heroin    Comment: took about 1 month ago- as of 01-19-17     Allergies   Patient has no known  allergies.   Review of Systems Review of Systems  Unable to perform ROS: Mental status change     Physical Exam Updated Vital Signs BP 121/73   Pulse 81   Temp (!) 96.9 F (36.1 C) (Axillary)   Resp (!) 22   SpO2 100%   Physical Exam Vitals signs and nursing note reviewed.  Constitutional:      General: He is not in acute distress.    Appearance: Normal appearance. He is well-developed.  HENT:     Head: Normocephalic and atraumatic.     Right Ear: Hearing normal.     Left Ear: Hearing normal.  Nose: Nose normal.  Eyes:     Conjunctiva/sclera: Conjunctivae normal.     Pupils: Pupils are equal, round, and reactive to light.  Neck:     Musculoskeletal: Normal range of motion and neck supple.  Cardiovascular:     Rate and Rhythm: Regular rhythm.     Heart sounds: S1 normal and S2 normal. No murmur. No friction rub. No gallop.   Pulmonary:     Effort: Pulmonary effort is normal. No respiratory distress.     Breath sounds: Normal breath sounds.  Chest:     Chest wall: No tenderness.  Abdominal:     General: Bowel sounds are normal.     Palpations: Abdomen is soft.     Tenderness: There is no abdominal tenderness. There is no guarding or rebound. Negative signs include Murphy's sign and McBurney's sign.     Hernia: No hernia is present.  Musculoskeletal: Normal range of motion.  Skin:    General: Skin is warm and dry.     Findings: No rash.  Neurological:     Cranial Nerves: No cranial nerve deficit.     Sensory: No sensory deficit.     Coordination: Coordination normal.     Comments: Somnolent, awakens to painful stimuli, localizes  Psychiatric:        Speech: Speech normal.        Behavior: Behavior normal.        Thought Content: Thought content normal.      ED Treatments / Results  Labs (all labs ordered are listed, but only abnormal results are displayed) Labs Reviewed  CBC WITH DIFFERENTIAL/PLATELET - Abnormal; Notable for the following components:       Result Value   WBC 2.8 (*)    RBC 2.82 (*)    Hemoglobin 9.9 (*)    HCT 29.9 (*)    MCV 106.0 (*)    MCH 35.1 (*)    Platelets 75 (*)    Lymphs Abs 0.6 (*)    All other components within normal limits  COMPREHENSIVE METABOLIC PANEL - Abnormal; Notable for the following components:   Chloride 114 (*)    CO2 17 (*)    Creatinine, Ser 1.58 (*)    Calcium 8.0 (*)    Total Protein 6.0 (*)    Albumin 2.1 (*)    AST 52 (*)    Total Bilirubin 2.9 (*)    GFR calc non Af Amer 46 (*)    GFR calc Af Amer 53 (*)    All other components within normal limits  AMMONIA - Abnormal; Notable for the following components:   Ammonia 203 (*)    All other components within normal limits  ETHANOL  URINALYSIS, ROUTINE W REFLEX MICROSCOPIC    EKG None  Radiology Dg Chest Port 1 View  Result Date: 05/10/2018 CLINICAL DATA:  Mental status changes. EXAM: PORTABLE CHEST 1 VIEW COMPARISON:  07/10/2017 FINDINGS: Normal heart size and pulmonary vascularity. No focal airspace disease or consolidation in the lungs. No blunting of costophrenic angles. No pneumothorax. Mediastinal contours appear intact. Degenerative changes in the shoulders. Old left rib fractures. IMPRESSION: No active disease. Electronically Signed   By: Lucienne Capers M.D.   On: 05/10/2018 01:01    Procedures Procedures (including critical care time)  Medications Ordered in ED Medications - No data to display   Initial Impression / Assessment and Plan / ED Course  I have reviewed the triage vital signs and the nursing notes.  Pertinent labs &  imaging results that were available during my care of the patient were reviewed by me and considered in my medical decision making (see chart for details).     Patient brought to the emergency department for evaluation of altered mental status.  Patient has a history of liver cirrhosis.  He was confused, agitated and combative earlier tonight.  EMS sedated him with Ativan and Haldol,  patient now somnolent.  Work-up reveals significant hyperammonemia, otherwise work-up unremarkable.  Will require hospitalization for further management of acute hepatic encephalopathy.  Final Clinical Impressions(s) / ED Diagnoses   Final diagnoses:  Hepatic encephalopathy [facility])    ED Discharge Orders    None       Betsey Holiday Gwenyth Allegra, MD 05/10/18 [phone]

## 2018-05-10 NOTE — Progress Notes (Signed)
Pharmacy Antibiotic Note  Mark [name] is a 63 y.o. male admitted on 05/10/2018 with AMS.  Now with MSSA bacteremia and Pharmacy has been consulted for Ancef dosing.  SCr 1.55, CrCL 39 ml/min, afebrile, WBC 2.8, LA 2.9.   Plan: Ancef 2gm IV Q8H Monitor renal fxn, clinical progress F/u ID consult, repeat BCx  Height: 5\' 8"  (172.7 cm) Weight: 127 lb 10.3 oz (57.9 kg) IBW/kg (Calculated) : 68.4  Temp (24hrs), Avg:98.2 F (36.8 C), Min:96.9 F (36.1 C), Max:98.7 F (37.1 C)  Recent Labs  Lab 05/10/18 0042 05/10/18 0452  WBC 2.8*  --   CREATININE 1.58* 1.55*  LATICACIDVEN  --  2.9*    Estimated Creatinine Clearance: 39.4 mL/min (A) (by C-G formula based on SCr of 1.55 mg/dL (H)).    No Known Allergies   Ancef 1/13 >>  1/13 MRSA PCR - negative 1/13 BCx - 3 of 4 GPC (BCID MSSA)  Sherell Christoffel D. Mina Marble, PharmD, BCPS, [place] 05/10/2018, 9:29 PM

## 2018-05-10 NOTE — ED Notes (Signed)
Patient transported to CT 

## 2018-05-11 ENCOUNTER — Encounter (HOSPITAL_COMMUNITY): Payer: Self-pay | Admitting: Internal Medicine

## 2018-05-11 DIAGNOSIS — R7881 Bacteremia: Secondary | ICD-10-CM | POA: Diagnosis present

## 2018-05-11 DIAGNOSIS — X088XXD Exposure to other specified smoke, fire and flames, subsequent encounter: Secondary | ICD-10-CM

## 2018-05-11 DIAGNOSIS — R21 Rash and other nonspecific skin eruption: Secondary | ICD-10-CM

## 2018-05-11 DIAGNOSIS — K7031 Alcoholic cirrhosis of liver with ascites: Secondary | ICD-10-CM

## 2018-05-11 DIAGNOSIS — T22011D Burn of unspecified degree of right forearm, subsequent encounter: Secondary | ICD-10-CM

## 2018-05-11 DIAGNOSIS — Z720 Tobacco use: Secondary | ICD-10-CM

## 2018-05-11 DIAGNOSIS — I129 Hypertensive chronic kidney disease with stage 1 through stage 4 chronic kidney disease, or unspecified chronic kidney disease: Secondary | ICD-10-CM

## 2018-05-11 DIAGNOSIS — K7290 Hepatic failure, unspecified without coma: Secondary | ICD-10-CM

## 2018-05-11 DIAGNOSIS — B9561 Methicillin susceptible Staphylococcus aureus infection as the cause of diseases classified elsewhere: Secondary | ICD-10-CM | POA: Diagnosis present

## 2018-05-11 DIAGNOSIS — N182 Chronic kidney disease, stage 2 (mild): Secondary | ICD-10-CM

## 2018-05-11 DIAGNOSIS — K000 Anodontia: Secondary | ICD-10-CM

## 2018-05-11 DIAGNOSIS — Z8673 Personal history of transient ischemic attack (TIA), and cerebral infarction without residual deficits: Secondary | ICD-10-CM

## 2018-05-11 DIAGNOSIS — F1010 Alcohol abuse, uncomplicated: Secondary | ICD-10-CM

## 2018-05-11 DIAGNOSIS — I2510 Atherosclerotic heart disease of native coronary artery without angina pectoris: Secondary | ICD-10-CM

## 2018-05-11 DIAGNOSIS — F1190 Opioid use, unspecified, uncomplicated: Secondary | ICD-10-CM

## 2018-05-11 DIAGNOSIS — K08109 Complete loss of teeth, unspecified cause, unspecified class: Secondary | ICD-10-CM | POA: Diagnosis present

## 2018-05-11 DIAGNOSIS — D61818 Other pancytopenia: Secondary | ICD-10-CM

## 2018-05-11 LAB — CBC
HCT: 23.1 % — ABNORMAL LOW (ref 39.0–52.0)
Hemoglobin: 7.7 g/dL — ABNORMAL LOW (ref 13.0–17.0)
MCH: 35 pg — ABNORMAL HIGH (ref 26.0–34.0)
MCHC: 33.3 g/dL (ref 30.0–36.0)
MCV: 105 fL — ABNORMAL HIGH (ref 80.0–100.0)
Platelets: 86 10*3/uL — ABNORMAL LOW (ref 150–400)
RBC: 2.2 MIL/uL — ABNORMAL LOW (ref 4.22–5.81)
RDW: 15.4 % (ref 11.5–15.5)
WBC: 5.5 10*3/uL (ref 4.0–10.5)
nRBC: 0 % (ref 0.0–0.2)

## 2018-05-11 LAB — BASIC METABOLIC PANEL
Anion gap: 9 (ref 5–15)
BUN: 14 mg/dL (ref 8–23)
CO2: 15 mmol/L — ABNORMAL LOW (ref 22–32)
Calcium: 8.3 mg/dL — ABNORMAL LOW (ref 8.9–10.3)
Chloride: 120 mmol/L — ABNORMAL HIGH (ref 98–111)
Creatinine, Ser: 1.44 mg/dL — ABNORMAL HIGH (ref 0.61–1.24)
GFR calc Af Amer: 59 mL/min — ABNORMAL LOW (ref >60)
GFR calc non Af Amer: 51 mL/min — ABNORMAL LOW (ref >60)
Glucose, Bld: 81 mg/dL (ref 70–99)
Potassium: 3.3 mmol/L — ABNORMAL LOW (ref 3.5–5.1)
Sodium: 144 mmol/L (ref 135–145)

## 2018-05-11 LAB — AMMONIA: Ammonia: 27 umol/L (ref 9–35)

## 2018-05-11 MED ORDER — POTASSIUM CHLORIDE CRYS ER 20 MEQ PO TBCR
40.0000 meq | EXTENDED_RELEASE_TABLET | Freq: Once | ORAL | Status: AC
  Administered 2018-05-11: 40 meq via ORAL
  Filled 2018-05-11: qty 2

## 2018-05-11 MED ORDER — PANTOPRAZOLE SODIUM 40 MG PO TBEC
40.0000 mg | DELAYED_RELEASE_TABLET | Freq: Every day | ORAL | Status: DC
  Administered 2018-05-11 – 2018-05-14 (×4): 40 mg via ORAL
  Filled 2018-05-11 (×4): qty 1

## 2018-05-11 MED ORDER — LACTULOSE 10 GM/15ML PO SOLN
30.0000 g | Freq: Two times a day (BID) | ORAL | Status: DC
  Administered 2018-05-11 – 2018-05-13 (×2): 30 g via ORAL
  Administered 2018-05-13: 10 g via ORAL
  Administered 2018-05-14: 30 g via ORAL
  Filled 2018-05-11 (×7): qty 45

## 2018-05-11 MED ORDER — NADOLOL 20 MG PO TABS
20.0000 mg | ORAL_TABLET | Freq: Every day | ORAL | Status: DC
  Administered 2018-05-11 – 2018-05-14 (×4): 20 mg via ORAL
  Filled 2018-05-11 (×5): qty 1

## 2018-05-11 MED ORDER — SPIRONOLACTONE 25 MG PO TABS
100.0000 mg | ORAL_TABLET | Freq: Two times a day (BID) | ORAL | Status: DC
  Administered 2018-05-11 – 2018-05-14 (×7): 100 mg via ORAL
  Filled 2018-05-11 (×7): qty 4

## 2018-05-11 MED ORDER — VITAMIN B-1 100 MG PO TABS
100.0000 mg | ORAL_TABLET | Freq: Every day | ORAL | Status: DC
  Administered 2018-05-11 – 2018-05-14 (×4): 100 mg via ORAL
  Filled 2018-05-11 (×4): qty 1

## 2018-05-11 NOTE — Progress Notes (Signed)
[facility] Infusion Coordinator will follow pt progress with ID team to support home infusion pharmacy services if ordered and if home is DC disposition.  If patient discharges after hours, please call [phone].   Mark [name] 05/11/2018, 9:48 AM

## 2018-05-11 NOTE — Consult Note (Addendum)
Mark [name] for Infectious Disease    Date of Admission:  05/10/2018   Total days of antibiotics 2        Day 2 Cefazolin              Reason for Consult: MSSA Bacteremia    Referring Provider: Debbe Odea, MD Primary Care Provider: Grier Mitts, MD  Assessment:   Mark [name] is a 63 year old male with a history of alcoholic cirrhosis complicated by ascites and pancytopenia, HTN, CVA, CAD, CKD II-III, and Substance use (Alcohol, Heroin, and Marijuana) who presented with 1 day of altered mental status. Patient found to have MSSA bacteremia likely from either his recent burn site or excoriations from scratching. Unclear how long he has been bacteremic given his minimal immune response; this was likely contributing if not the driver of his encephalopathy. He has responded well clinically to cefazolin. He has remained afebrile without leukocytosis, which is likely due to known pancytopenia and immunosuppressed state of cirrhosis. Will continue Cefazolin and evaluate for endocarditis.  Plan:  1. MSSA Bacteremia - Continue Cefazolin - Repeat blood cultures - TTE to evaluate for endocarditis, followed by TEE if negative  Principal Problem:   Hepatic encephalopathy ([name]) Active Problems:   Cirrhosis, alcoholic ([name])   MSSA bacteremia   Substance abuse ([name])   CKD (chronic kidney disease) stage 3, GFR 30-59 ml/min ([name])   Hypotension   Pancytopenia ([name])   CAD (coronary artery disease)   Alcohol abuse   GERD (gastroesophageal reflux disease)   Scheduled Meds: . lactulose  30 g Oral BID  . nadolol  20 mg Oral Daily  . pantoprazole  40 mg Oral Daily  . spironolactone  100 mg Oral BID  . thiamine  100 mg Oral Daily   Continuous Infusions: .  ceFAZolin (ANCEF) IV 2 g (05/11/18 0537)   PRN Meds:.ondansetron **OR** ondansetron (ZOFRAN) IV  HPI: Mark [name] is a 63 y.o. male with a history of alcoholic cirrhosis, pancytopenia, HTN, CVA, CAD, CKD II-III, and  Substance use (Alcohol, Heroin, and Marijuana) who presented with 1 day of altered mental status. Due to the extent of his encephalopathy he was unable to participate in initial exams. He was treated with lactulose for hepatic encephalopathy given his history of cirrhosis. Blood cultures were drawn as a part of his work up and returned positive for MSSA in 4/4 bottles. He was started on Cefazolin and ID was consulted.  Of note patient has a rash that began about 3-4 week ago and a burn on his right fore arm that occurred about 2 weeks ago. He was seen for the burn and rash at his PCP's office on 04/19/18 and was prescribed kenalog cream and referred to dermatology for a visit in February. He was seen again for the rash in the ED on 04/25/18 and prescribed prednisone and benadryl. He states the rash has been slowing improving since that time but persists and is pruritic.  This morning he is alert and oriented. He was surprised to have lost a day, thinking it was Monday initially. He states he snorts heroin daily but states he has never injected drugs.  Review of Systems: Review of Systems  Constitutional: Negative for chills and fever.  Respiratory: Negative for shortness of breath.   Cardiovascular: Negative for chest pain.  Gastrointestinal: Negative for nausea.  Skin: Positive for itching and rash.    Past Medical History:  Diagnosis Date  .  Alcohol abuse   . Anemia   . Arthritis   . Ascites   . Cirrhosis ([place])   . Coffee ground emesis   . Dehydration 06/17/2017  . Febrile illness   . Heart murmur   . Hyperlipidemia   . Hypertension   . Leg swelling   . Myocardial infarction ([place]) 2012  . Preop cardiovascular exam 04/14/2013  . Sepsis ([place]) 06/17/2017  . Septic shock ([place]) 06/18/2017  . SIRS (systemic inflammatory response syndrome) ([place]) 07/11/2017  . Stroke [facility]) 2012   no deficits  . Thrombocytopenia ([name])     Social History   Tobacco Use  . Smoking status: Former Smoker     Packs/day: 0.50    Years: 10.00    Pack years: 5.00    Types: Cigarettes    Last attempt to quit: 01/05/2016    Years since quitting: 2.3  . Smokeless tobacco: Never Used  . Tobacco comment: 4 cigarettes a day  Substance Use Topics  . Alcohol use: No    Comment: 40 oz per week as of 01-19-17  . Drug use: Yes    Types: Marijuana, Heroin    Comment: took about 1 month ago- as of 01-19-17    Family History  Problem Relation Age of Onset  . Hypertension Father   . Diabetes Father   . Dementia Mother   . Lupus Sister    No Known Allergies  OBJECTIVE: Blood pressure (!) 124/58, pulse 73, temperature 98 F (36.7 C), temperature source Oral, resp. rate 18, height 5\' 8"  (1.727 m), weight 57.9 kg, SpO2 100 %.  Physical Exam Constitutional:      General: He is not in acute distress.    Comments: Thin male  HENT:     Mouth/Throat:     Mouth: Mucous membranes are moist.     Pharynx: Oropharynx is clear. No oropharyngeal exudate or posterior oropharyngeal erythema.     Comments: Edentulous Cardiovascular:     Rate and Rhythm: Normal rate and regular rhythm.     Pulses: Normal pulses.     Heart sounds: Normal heart sounds.  Pulmonary:     Effort: Pulmonary effort is normal. No respiratory distress.     Breath sounds: Normal breath sounds.  Abdominal:     General: Abdomen is flat. Bowel sounds are normal. There is no distension.     Palpations: Abdomen is soft.     Tenderness: There is no abdominal tenderness.  Musculoskeletal:        General: No swelling or tenderness.     Right lower leg: No edema.     Left lower leg: No edema.  Skin:    General: Skin is warm and dry.     Findings: Rash present.     Comments: Rash of bilateral forearms, hands, feet, and face Healing burn on right forearm near wrist Excoriations along distribution of rash on his arms  Neurological:     General: No focal deficit present.     Mental Status: He is alert and oriented to person, place, and time.  Mental status is at baseline.    Lab Results Lab Results  Component Value Date   WBC 5.5 05/11/2018   HGB 7.7 (L) 05/11/2018   HCT 23.1 (L) 05/11/2018   MCV 105.0 (H) 05/11/2018   PLT 86 (L) 05/11/2018    Lab Results  Component Value Date   CREATININE 1.44 (H) 05/11/2018   BUN 14 05/11/2018   NA 144 05/11/2018   K  3.3 (L) 05/11/2018   CL 120 (H) 05/11/2018   CO2 15 (L) 05/11/2018    Lab Results  Component Value Date   ALT 28 05/10/2018   AST 62 (H) 05/10/2018   ALKPHOS 91 05/10/2018   BILITOT 3.8 (H) 05/10/2018     Microbiology: Recent Results (from the past 240 hour(s))  Culture, blood (Routine X 2) w Reflex to ID Panel     Status: Abnormal (Preliminary result)   Collection Time: 05/10/18  4:52 AM  Result Value Ref Range Status   Specimen Description BLOOD RIGHT ANTECUBITAL  Final   Special Requests   Final    BOTTLES DRAWN AEROBIC AND ANAEROBIC Blood Culture adequate volume   Culture  Setup Time   Final    IN BOTH AEROBIC AND ANAEROBIC BOTTLES GRAM POSITIVE COCCI CRITICAL RESULT CALLED TO, READ BACK BY AND VERIFIED WITH: T PANGDANG PHARMD 05/10/18 2054 JDW    Culture (A)  Final    STAPHYLOCOCCUS AUREUS SUSCEPTIBILITIES TO FOLLOW Performed at [facility], [address], [place], [place] [address]    Report Status PENDING  Incomplete  Culture, blood (Routine X 2) w Reflex to ID Panel     Status: Abnormal (Preliminary result)   Collection Time: 05/10/18  4:52 AM  Result Value Ref Range Status   Specimen Description BLOOD RIGHT ANTECUBITAL  Final   Special Requests   Final    BOTTLES DRAWN AEROBIC AND ANAEROBIC Blood Culture adequate volume   Culture  Setup Time   Final    IN BOTH AEROBIC AND ANAEROBIC BOTTLES GRAM POSITIVE COCCI CRITICAL VALUE NOTED.  VALUE IS CONSISTENT WITH PREVIOUSLY REPORTED AND CALLED VALUE. Performed at [facility], [place] [address] [place], [place] [address]    Culture STAPHYLOCOCCUS AUREUS (A)  Final   Report Status  PENDING  Incomplete  Blood Culture ID Panel (Reflexed)     Status: Abnormal   Collection Time: 05/10/18  4:52 AM  Result Value Ref Range Status   Enterococcus species NOT DETECTED NOT DETECTED Final   Listeria monocytogenes NOT DETECTED NOT DETECTED Final   Staphylococcus species DETECTED (A) NOT DETECTED Final    Comment: CRITICAL RESULT CALLED TO, READ BACK BY AND VERIFIED WITH: T PANGDANG PHARMD 05/10/18 2054 JDW    Staphylococcus aureus (BCID) DETECTED (A) NOT DETECTED Final    Comment: Methicillin (oxacillin) susceptible Staphylococcus aureus (MSSA). Preferred therapy is anti staphylococcal beta lactam antibiotic (Cefazolin or Nafcillin), unless clinically contraindicated. CRITICAL RESULT CALLED TO, READ BACK BY AND VERIFIED WITH: T PANGDANG PHARMD 05/10/18 JDW    Methicillin resistance NOT DETECTED NOT DETECTED Final   Streptococcus species NOT DETECTED NOT DETECTED Final   Streptococcus agalactiae NOT DETECTED NOT DETECTED Final   Streptococcus pneumoniae NOT DETECTED NOT DETECTED Final   Streptococcus pyogenes NOT DETECTED NOT DETECTED Final   Acinetobacter baumannii NOT DETECTED NOT DETECTED Final   Enterobacteriaceae species NOT DETECTED NOT DETECTED Final   Enterobacter cloacae complex NOT DETECTED NOT DETECTED Final   Escherichia coli NOT DETECTED NOT DETECTED Final   Klebsiella oxytoca NOT DETECTED NOT DETECTED Final   Klebsiella pneumoniae NOT DETECTED NOT DETECTED Final   Proteus species NOT DETECTED NOT DETECTED Final   Serratia marcescens NOT DETECTED NOT DETECTED Final   Haemophilus influenzae NOT DETECTED NOT DETECTED Final   Neisseria meningitidis NOT DETECTED NOT DETECTED Final   Pseudomonas aeruginosa NOT DETECTED NOT DETECTED Final   Candida albicans NOT DETECTED NOT DETECTED Final   Candida glabrata NOT  DETECTED NOT DETECTED Final   Candida krusei NOT DETECTED NOT DETECTED Final   Candida parapsilosis NOT DETECTED NOT DETECTED Final   Candida tropicalis NOT  DETECTED NOT DETECTED Final    Comment: Performed at [facility], [place] [address], [place], [place] [address]  MRSA PCR Screening     Status: None   Collection Time: 05/10/18 10:21 AM  Result Value Ref Range Status   MRSA by PCR NEGATIVE NEGATIVE Final    Comment:        The GeneXpert MRSA Assay (FDA approved for NASAL specimens only), is one component of a comprehensive MRSA colonization surveillance program. It is not intended to diagnose MRSA infection nor to guide or monitor treatment for MRSA infections. Performed at [facility], [place] [address] [place], [place] [address]     Neva Seat, MD IM PGY-2 [phone] Attending: [phone] pager   [phone] cell 05/11/2018, 9:56 AM

## 2018-05-11 NOTE — Progress Notes (Signed)
PROGRESS NOTE    Mark [name]   IHK:[id number]  DOB: 08/24/1955  DOA: 05/10/2018 PCP: [name], MD   Brief Narrative:  Mark [name]  is a 63 y.o.malewith medical history significant ofalcoholic liver cirrhosis with ascites, pancytopenia, hypertension, stroke, cardiac, CAD, polysubstance abuse (alcohol, heroin, marijuana), CKD-3, who presents with altered mental status. EMS was called for confusion and he was noted to be combative. 2.5mg  Haldol and 5mg  versed administered by EMS last night. Ammonia level 203. Blood cultures returned + for MSSA overnight and started on Cefazolin.   Subjective: He has not complaints today.     Assessment & Plan:   Principal Problem:   Hepatic encephalopathy - lethargic yesterday - improved with lactulose enema's - he is alert today- will start diet/ oral liquids, d/c IVF   Active Problems: MSSA - source uncertain but he has very dry skin (diffusely) which is flaking all over his sheet and may have a minor skin tear or abrasion which could have caused this - has 2 sets of blood cultures + for this-  Ancef started - ID is now following- repeat blood cultures and TEE ordered    Cirrhosis, alcoholic with ascites - stop IVF as he is alert enough to eat/drink today - resume Aldactone and Nadolol today and follow- Lasix still on hold today  CKD3 - Cr ranges from 0.9-1.4 at baseline  Anemia -Hb 9 yesterday but 7.7 today-partly dilutional? Stopping IVF today -  &>& appears to be around his baseline- he was 7-8 in March - will check an anemia panel - if lower tomorrow, may need to consider a transfusion   Alcohol abuse-  Cirrhosis, alcoholic with portal hypertension and ascites  Acquired coagulopathy- INR 1.l47 - Nadolol and Aldactone resume today as he is alert- hold Lasix for now and ensure his is not becoming dehydrated with Aldactone for now    Substance abuse  - UDS + for narcotics and Benzodiazepines   Pancytopenia - chronic and stable     GERD (gastroesophageal reflux disease) -  resume PPI today   Time spent in minutes: 40 DVT prophylaxis: SCDs- ambulate Code Status: Full code Family Communication:  Disposition Plan: f/u ID reccomendations Consultants:   ID Procedures:   none Antimicrobials:  Anti-infectives (From admission, onward)   Start     Dose/Rate Route Frequency Ordered Stop   05/10/18 2200  ceFAZolin (ANCEF) IVPB 2g/100 mL premix     2 g 200 mL/hr over 30 Minutes Intravenous Every 8 hours 05/10/18 2131     05/10/18 0500  cefTRIAXone (ROCEPHIN) 1 g in sodium chloride 0.9 % 100 mL IVPB  Status:  Discontinued     1 g 200 mL/hr over 30 Minutes Intravenous Every 24 hours 05/10/18 0436 05/10/18 1526       Objective: Vitals:   05/10/18 0800 05/10/18 1626 05/10/18 2059 05/11/18 0509  BP: 102/65 129/69 114/78 (!) 124/58  Pulse: (!) 117 76 68 73  Resp: (!) 22 18 17 18   Temp:  98.4 F (36.9 C) 98.3 F (36.8 C) 98 F (36.7 C)  TempSrc:  Oral Oral Oral  SpO2: 100% 100% 100% 100%  Weight:  57.9 kg    Height:  5\' 8"  (1.727 m)      Intake/Output Summary (Last 24 hours) at 05/11/2018 0857 Last data filed at 05/10/2018 1800 Gross per 24 hour  Intake 794.11 ml  Output -  Net 794.11 ml   Filed Weights   05/10/18 1626  Weight: 57.9  kg    Examination: General exam: Appears comfortable  HEENT: PERRLA, oral mucosa moist, no sclera icterus or thrush Respiratory system: Clear to auscultation. Respiratory effort normal. Cardiovascular system: S1 & S2 heard, RRR.   Gastrointestinal system: Abdomen soft, non-tender, nondistended. Normal bowel sounds. Central nervous system: Alert and oriented. No focal neurological deficits. Extremities: No cyanosis, clubbing or edema Skin: No rashes or ulcers Psychiatry:  Mood & affect appropriate.     Data Reviewed: I have personally reviewed following labs and imaging studies  CBC: Recent Labs  Lab 05/10/18 0042  WBC  2.8*  NEUTROABS 1.7  HGB 9.9*  HCT 29.9*  MCV 106.0*  PLT 75*   Basic Metabolic Panel: Recent Labs  Lab 05/10/18 0042 05/10/18 0452  NA 139 138  K 4.8 5.1  CL 114* 112*  CO2 17* 17*  GLUCOSE 87 81  BUN 15 14  CREATININE 1.58* 1.55*  CALCIUM 8.0* 8.1*   GFR: Estimated Creatinine Clearance: 39.4 mL/min (A) (by C-G formula based on SCr of 1.55 mg/dL (H)). Liver Function Tests: Recent Labs  Lab 05/10/18 0042 05/10/18 0452  AST 52* 62*  ALT 23 28  ALKPHOS 87 91  BILITOT 2.9* 3.8*  PROT 6.0* 6.4*  ALBUMIN 2.1* 2.2*   No results for input(s): LIPASE, AMYLASE in the last 168 hours. Recent Labs  Lab 05/10/18 0042  AMMONIA 203*   Coagulation Profile: Recent Labs  Lab 05/10/18 0044  INR 1.47   Cardiac Enzymes: No results for input(s): CKTOTAL, CKMB, CKMBINDEX, TROPONINI in the last 168 hours. BNP (last 3 results) No results for input(s): PROBNP in the last 8760 hours. HbA1C: No results for input(s): HGBA1C in the last 72 hours. CBG: No results for input(s): GLUCAP in the last 168 hours. Lipid Profile: No results for input(s): CHOL, HDL, LDLCALC, TRIG, CHOLHDL, LDLDIRECT in the last 72 hours. Thyroid Function Tests: No results for input(s): TSH, T4TOTAL, FREET4, T3FREE, THYROIDAB in the last 72 hours. Anemia Panel: No results for input(s): VITAMINB12, FOLATE, FERRITIN, TIBC, IRON, RETICCTPCT in the last 72 hours. Urine analysis:    Component Value Date/Time   COLORURINE YELLOW 05/10/2018 0310   APPEARANCEUR CLEAR 05/10/2018 0310   LABSPEC 1.010 05/10/2018 0310   PHURINE 5.0 05/10/2018 0310   GLUCOSEU NEGATIVE 05/10/2018 0310   HGBUR SMALL (A) 05/10/2018 0310   BILIRUBINUR NEGATIVE 05/10/2018 0310   KETONESUR NEGATIVE 05/10/2018 0310   PROTEINUR NEGATIVE 05/10/2018 0310   UROBILINOGEN 2.0 (H) 04/26/2013 0945   NITRITE NEGATIVE 05/10/2018 0310   LEUKOCYTESUR NEGATIVE 05/10/2018 0310   Sepsis Labs: @LABRCNTIP (procalcitonin:4,lacticidven:4) ) Recent  Results (from the past 240 hour(s))  Culture, blood (Routine X 2) w Reflex to ID Panel     Status: Abnormal (Preliminary result)   Collection Time: 05/10/18  4:52 AM  Result Value Ref Range Status   Specimen Description BLOOD RIGHT ANTECUBITAL  Final   Special Requests   Final    BOTTLES DRAWN AEROBIC AND ANAEROBIC Blood Culture adequate volume   Culture  Setup Time   Final    IN BOTH AEROBIC AND ANAEROBIC BOTTLES GRAM POSITIVE COCCI CRITICAL RESULT CALLED TO, READ BACK BY AND VERIFIED WITH: T PANGDANG PHARMD 05/10/18 2054 JDW    Culture (A)  Final    STAPHYLOCOCCUS AUREUS SUSCEPTIBILITIES TO FOLLOW Performed at [facility], [place] [address], [place], [place] [address]    Report Status PENDING  Incomplete  Culture, blood (Routine X 2) w Reflex to ID Panel     Status: Abnormal (  Preliminary result)   Collection Time: 05/10/18  4:52 AM  Result Value Ref Range Status   Specimen Description BLOOD RIGHT ANTECUBITAL  Final   Special Requests   Final    BOTTLES DRAWN AEROBIC AND ANAEROBIC Blood Culture adequate volume   Culture  Setup Time   Final    IN BOTH AEROBIC AND ANAEROBIC BOTTLES GRAM POSITIVE COCCI CRITICAL VALUE NOTED.  VALUE IS CONSISTENT WITH PREVIOUSLY REPORTED AND CALLED VALUE. Performed at [facility], [place] [address] [place], [place] [address]    Culture STAPHYLOCOCCUS AUREUS (A)  Final   Report Status PENDING  Incomplete  Blood Culture ID Panel (Reflexed)     Status: Abnormal   Collection Time: 05/10/18  4:52 AM  Result Value Ref Range Status   Enterococcus species NOT DETECTED NOT DETECTED Final   Listeria monocytogenes NOT DETECTED NOT DETECTED Final   Staphylococcus species DETECTED (A) NOT DETECTED Final    Comment: CRITICAL RESULT CALLED TO, READ BACK BY AND VERIFIED WITH: T PANGDANG PHARMD 05/10/18 2054 JDW    Staphylococcus aureus (BCID) DETECTED (A) NOT DETECTED Final    Comment: Methicillin (oxacillin) susceptible Staphylococcus aureus (MSSA).  Preferred therapy is anti staphylococcal beta lactam antibiotic (Cefazolin or Nafcillin), unless clinically contraindicated. CRITICAL RESULT CALLED TO, READ BACK BY AND VERIFIED WITH: T PANGDANG PHARMD 05/10/18 JDW    Methicillin resistance NOT DETECTED NOT DETECTED Final   Streptococcus species NOT DETECTED NOT DETECTED Final   Streptococcus agalactiae NOT DETECTED NOT DETECTED Final   Streptococcus pneumoniae NOT DETECTED NOT DETECTED Final   Streptococcus pyogenes NOT DETECTED NOT DETECTED Final   Acinetobacter baumannii NOT DETECTED NOT DETECTED Final   Enterobacteriaceae species NOT DETECTED NOT DETECTED Final   Enterobacter cloacae complex NOT DETECTED NOT DETECTED Final   Escherichia coli NOT DETECTED NOT DETECTED Final   Klebsiella oxytoca NOT DETECTED NOT DETECTED Final   Klebsiella pneumoniae NOT DETECTED NOT DETECTED Final   Proteus species NOT DETECTED NOT DETECTED Final   Serratia marcescens NOT DETECTED NOT DETECTED Final   Haemophilus influenzae NOT DETECTED NOT DETECTED Final   Neisseria meningitidis NOT DETECTED NOT DETECTED Final   Pseudomonas aeruginosa NOT DETECTED NOT DETECTED Final   Candida albicans NOT DETECTED NOT DETECTED Final   Candida glabrata NOT DETECTED NOT DETECTED Final   Candida krusei NOT DETECTED NOT DETECTED Final   Candida parapsilosis NOT DETECTED NOT DETECTED Final   Candida tropicalis NOT DETECTED NOT DETECTED Final    Comment: Performed at [facility], [place] [address] [place], [place] [address]  MRSA PCR Screening     Status: None   Collection Time: 05/10/18 10:21 AM  Result Value Ref Range Status   MRSA by PCR NEGATIVE NEGATIVE Final    Comment:        The GeneXpert MRSA Assay (FDA approved for NASAL specimens only), is one component of a comprehensive MRSA colonization surveillance program. It is not intended to diagnose MRSA infection nor to guide or monitor treatment for MRSA infections. Performed at [facility],  [place] [address] [place], [place] [address]          Radiology Studies: Ct Head Wo Contrast  Result Date: 05/10/2018 CLINICAL DATA:  Altered mental status/confusion EXAM: CT HEAD WITHOUT CONTRAST TECHNIQUE: Contiguous axial images were obtained from the base of the skull through the vertex without intravenous contrast. COMPARISON:  MR brain dated 11/02/2009 FINDINGS: Brain: No evidence of acute infarction, hemorrhage, hydrocephalus, extra-axial collection or mass lesion/mass  effect. Subcortical white matter and periventricular small vessel ischemic changes. Vascular: No hyperdense vessel or unexpected calcification. Skull: Normal. Negative for fracture or focal lesion. Sinuses/Orbits: The visualized paranasal sinuses are essentially clear. The mastoid air cells are unopacified. Other: None. IMPRESSION: No evidence of acute intracranial abnormality. Small vessel ischemic changes. Electronically Signed   By: Julian Hy M.D.   On: 05/10/2018 02:27   Dg Chest Port 1 View  Result Date: 05/10/2018 CLINICAL DATA:  Mental status changes. EXAM: PORTABLE CHEST 1 VIEW COMPARISON:  07/10/2017 FINDINGS: Normal heart size and pulmonary vascularity. No focal airspace disease or consolidation in the lungs. No blunting of costophrenic angles. No pneumothorax. Mediastinal contours appear intact. Degenerative changes in the shoulders. Old left rib fractures. IMPRESSION: No active disease. Electronically Signed   By: Lucienne Capers M.D.   On: 05/10/2018 01:01      Scheduled Meds: . lactulose  300 mL Rectal TID   Continuous Infusions: . sodium chloride 75 mL/hr at 05/10/18 0803  .  ceFAZolin (ANCEF) IV 2 g (05/11/18 0537)  . famotidine (PEPCID) IV 20 mg (05/10/18 2200)     LOS: 1 day      Debbe Odea, MD [facility] Pager: [email or web address] Password [facility] 05/11/2018, 8:57 AM

## 2018-05-12 ENCOUNTER — Inpatient Hospital Stay (HOSPITAL_COMMUNITY): Payer: BC Managed Care – PPO

## 2018-05-12 DIAGNOSIS — L299 Pruritus, unspecified: Secondary | ICD-10-CM

## 2018-05-12 DIAGNOSIS — I340 Nonrheumatic mitral (valve) insufficiency: Secondary | ICD-10-CM

## 2018-05-12 DIAGNOSIS — I361 Nonrheumatic tricuspid (valve) insufficiency: Secondary | ICD-10-CM

## 2018-05-12 LAB — CBC
HCT: 22.6 % — ABNORMAL LOW (ref 39.0–52.0)
Hemoglobin: 7.6 g/dL — ABNORMAL LOW (ref 13.0–17.0)
MCH: 36 pg — ABNORMAL HIGH (ref 26.0–34.0)
MCHC: 33.6 g/dL (ref 30.0–36.0)
MCV: 107.1 fL — ABNORMAL HIGH (ref 80.0–100.0)
Platelets: 89 10*3/uL — ABNORMAL LOW (ref 150–400)
RBC: 2.11 MIL/uL — ABNORMAL LOW (ref 4.22–5.81)
RDW: 15.8 % — ABNORMAL HIGH (ref 11.5–15.5)
WBC: 5.7 10*3/uL (ref 4.0–10.5)
nRBC: 0 % (ref 0.0–0.2)

## 2018-05-12 LAB — BASIC METABOLIC PANEL
Anion gap: 8 (ref 5–15)
BUN: 14 mg/dL (ref 8–23)
CO2: 14 mmol/L — ABNORMAL LOW (ref 22–32)
Calcium: 8.4 mg/dL — ABNORMAL LOW (ref 8.9–10.3)
Chloride: 120 mmol/L — ABNORMAL HIGH (ref 98–111)
Creatinine, Ser: 1.33 mg/dL — ABNORMAL HIGH (ref 0.61–1.24)
GFR calc Af Amer: >60 mL/min (ref >60)
GFR calc non Af Amer: 56 mL/min — ABNORMAL LOW (ref >60)
Glucose, Bld: 142 mg/dL — ABNORMAL HIGH (ref 70–99)
Potassium: 3.8 mmol/L (ref 3.5–5.1)
Sodium: 142 mmol/L (ref 135–145)

## 2018-05-12 LAB — CULTURE, BLOOD (ROUTINE X 2)
Special Requests: ADEQUATE
Special Requests: ADEQUATE

## 2018-05-12 LAB — ECHOCARDIOGRAM COMPLETE
Height: 68 in
Weight: 2042.34 oz

## 2018-05-12 MED ORDER — SODIUM CHLORIDE 0.9 % IV SOLN
INTRAVENOUS | Status: DC
  Administered 2018-05-12: 22:00:00 via INTRAVENOUS

## 2018-05-12 MED ORDER — FUROSEMIDE 20 MG PO TABS
20.0000 mg | ORAL_TABLET | Freq: Two times a day (BID) | ORAL | Status: DC
  Administered 2018-05-12 – 2018-05-14 (×4): 20 mg via ORAL
  Filled 2018-05-12 (×4): qty 1

## 2018-05-12 NOTE — Evaluation (Signed)
Physical Therapy Evaluation Patient Details Name: Mark [name] MRN: [id number] DOB: 09/05/1955 Today's Date: 05/12/2018   History of Present Illness  Pt is a 63 y/o male history of alcoholic liver cirrhosis with ascites, hypertension, stroke, CAD, polysubstance abuse (alcohol, heroin, marijuana), CKD stage III presented with altered mental status.  He was found to be combative and needed Haldol and Versed by EMS.  Ammonia level was 203.  Blood cultures was positive for MSSA and he was started on Ancef.  ID was consulted.    Clinical Impression  Pt presented supine in bed with HOB elevated, awake and willing to participate in therapy session. Prior to admission, pt reported that he was independent with all functional mobility and ADLs. Pt currently at min guard level overall for transfers and ambulation with RW. Pt required one sitting rest break with ambulation secondary to fatigue. Pt would continue to benefit from skilled physical therapy services at this time while admitted and after d/c to address the below listed limitations in order to improve overall safety and independence with functional mobility.     Follow Up Recommendations Home health PT;Supervision/Assistance - 24 hour    Equipment Recommendations  None recommended by PT    Recommendations for Other Services       Precautions / Restrictions Precautions Precautions: Fall Restrictions Weight Bearing Restrictions: No      Mobility  Bed Mobility Overal bed mobility: Modified Independent                Transfers Overall transfer level: Needs assistance Equipment used: Rolling walker (2 wheeled) Transfers: Sit to/from Stand Sit to Stand: Min guard         General transfer comment: increased time and effort, min guard for safety, x1 from EOB and x1 from straight back chair  Ambulation/Gait Ambulation/Gait assistance: Min guard Gait Distance (Feet): 100 Feet(with one sitting rest break) Assistive  device: Rolling walker (2 wheeled) Gait Pattern/deviations: Step-through pattern;Decreased stride length;Drifts right/left Gait velocity: decreased   General Gait Details: pt with mild instability but no overt LOB or need for physical assistance, min guard for safety, cueing to maintain a safe distance from [employer] Mobility    Modified Rankin (Stroke Patients Only)       Balance Overall balance assessment: Needs assistance Sitting-balance support: Feet supported Sitting balance-[name]: Good     Standing balance support: Bilateral upper extremity supported Standing balance-[name]: Poor                               Pertinent Vitals/Pain Pain Assessment: No/denies pain    Home Living Family/patient expects to be discharged to:: Private residence Living Arrangements: Spouse/significant other Available Help at Discharge: Family Type of Home: House Home Access: Stairs to enter Entrance Stairs-Rails: None Entrance Stairs-Number of Steps: 2 Home Layout: One level Home Equipment: Environmental consultant - 2 wheels;Cane - single point      Prior Function Level of Independence: Independent               Hand Dominance        Extremity/Trunk Assessment   Upper Extremity Assessment Upper Extremity Assessment: Generalized weakness    Lower Extremity Assessment Lower Extremity Assessment: Generalized weakness       Communication   Communication: No difficulties  Cognition Arousal/Alertness: Awake/alert Behavior During Therapy: Flat affect Overall Cognitive Status: Impaired/Different from  baseline Area of Impairment: Safety/judgement;Problem solving                         Safety/Judgement: Decreased awareness of deficits;Decreased awareness of safety   Problem Solving: Slow processing;Difficulty sequencing;Requires verbal cues        General Comments      Exercises     Assessment/Plan    PT  Assessment Patient needs continued PT services  PT Problem List Decreased balance;Decreased mobility;Decreased coordination;Decreased knowledge of use of DME;Decreased safety awareness;Decreased knowledge of precautions       PT Treatment Interventions DME instruction;Gait training;Stair training;Therapeutic activities;Balance training;Functional mobility training;Therapeutic exercise;Neuromuscular re-education;Patient/family education    PT Goals (Current goals can be found in the Care Plan section)  Acute Rehab PT Goals Patient Stated Goal: for his wife to come visit him today PT Goal Formulation: With patient Time For Goal Achievement: 05/26/18 Potential to Achieve Goals: Good    Frequency Min 3X/week   Barriers to discharge        Co-evaluation               AM-PAC PT "6 Clicks" Mobility  Outcome Measure Help needed turning from your back to your side while in a flat bed without using bedrails?: None Help needed moving from lying on your back to sitting on the side of a flat bed without using bedrails?: None Help needed moving to and from a bed to a chair (including a wheelchair)?: A Little Help needed standing up from a chair using your arms (e.g., wheelchair or bedside chair)?: None Help needed to walk in hospital room?: A Little Help needed climbing 3-5 steps with a railing? : A Little 6 Click Score: 21    End of Session Equipment Utilized During Treatment: Gait belt Activity Tolerance: Patient tolerated treatment well Patient left: in bed;with call bell/phone within reach;with bed alarm set Nurse Communication: Mobility status PT Visit Diagnosis: Other abnormalities of gait and mobility (R26.89)    Time: 1443-1511 PT Time Calculation (min) (ACUTE ONLY): 28 min   Charges:   PT Evaluation $PT Eval Moderate Complexity: 1 Mod PT Treatments $Gait Training: 8-22 mins        Sherie Don, PT, DPT  Acute Rehabilitation Services Pager [phone] Office  [place] 05/12/2018, 3:49 PM

## 2018-05-12 NOTE — Progress Notes (Signed)
    [facility] has been requested to perform a transesophageal echocardiogram on [name] for bacteremia.  After careful review of history and examination, the risks and benefits of transesophageal echocardiogram have been explained including risks of esophageal damage, perforation (1:10,000 risk), bleeding, pharyngeal hematoma as well as other potential complications associated with conscious sedation including aspiration, arrhythmia, respiratory failure and death. Alternatives to treatment were discussed, questions were answered. Patient is willing to proceed.   Kathyrn Drown, NP  05/12/2018 5:25 PM

## 2018-05-12 NOTE — Progress Notes (Signed)
[facility]  Date of Admission:  05/10/2018                                   Total days of antibiotics 3                                                                                     Day 3 Cefazolin                                                                                                                                                  Reason for Consult: MSSA Bacteremia                                  Referring Provider: Debbe Odea, MD Primary Care Provider: Grier Mitts, MD  Assessment:  Mr Belling is a 63 year old male with a history of alcoholic cirrhosis complicated by ascites and pancytopenia, HTN, CVA, CAD, CKD II-III, and Substance use (Alcohol, Heroin, and Marijuana) who presented with 1 day of altered mental status. We are treating MSSA bacteremia with possible source of recent burn site or excoriations from scratching (though neither site appear infected). Unclear how long he has been bacteremic given his minimal immune response; this was likely contributing if not the driver of his encephalopathy. Repeat blood cultures from 1/15 are in process. TTE today to evaluate for endocarditis, likely followed by TEE if negative. Will continue Cefazolin. Patient is eager to leave, but has agreed to stay another night if needed. Will place line if repeat cultures are negative.  Plan:  1. MSSA Bacteremia - Continue Cefazolin - TTE today  Principal Problem:   MSSA bacteremia Active Problems:   Cirrhosis, alcoholic ([name])   Hepatic encephalopathy ([name])   Substance abuse ([name])   CKD (chronic kidney disease) stage 3, GFR 30-59 ml/min ([name])   Hypotension   Pancytopenia ([name])   CAD (coronary artery disease)   Alcohol abuse   GERD (gastroesophageal reflux disease)   Edentulous   Scheduled Meds: . lactulose  30 g Oral BID  . nadolol  20 mg Oral Daily  . pantoprazole  40 mg Oral Daily  . spironolactone  100 mg Oral BID  .  thiamine  100 mg Oral Daily   Continuous Infusions: .  ceFAZolin (ANCEF) IV 2 g (05/12/18  0543)   PRN Meds:.ondansetron **OR** ondansetron (ZOFRAN) IV   SUBJECTIVE: Mr Miklas is feeling better this morning and states he is ready to go home. When informed that we will need to complete his work up and we will likely need him to stay he states that he he needs to help his son pick up some wood to heat Mr Seelman's home and that no one else knows where it is located (on a friend's property). After discussing the need for further workup Mr Steelman stated he was agreeable to staying another night if needed and could plan to pick up the wood tomorrow afternoon. His itching continues to improve.  Review of Systems: Review of Systems  Constitutional: Negative for fever.  Respiratory: Negative for shortness of breath.   Cardiovascular: Negative for chest pain.  Skin: Positive for itching and rash.  Neurological: Negative for weakness.   No Known Allergies  OBJECTIVE: Vitals:   05/11/18 0509 05/11/18 1652 05/11/18 2048 05/12/18 0428  BP: (!) 124/58 126/83 (!) 148/73 (!) 153/77  Pulse: 73 88 (!) 58 64  Resp: 18 20 18 16   Temp: 98 F (36.7 C) (!) 97.5 F (36.4 C) (!) 97.5 F (36.4 C)   TempSrc: Oral Oral Oral   SpO2: 100% 100% 100% 100%  Weight:      Height:       Body mass index is 19.41 kg/m.  Physical Exam Constitutional:      General: He is not in acute distress.    Comments: Thin Male  HENT:     Head:     Comments: Edentulous  Cardiovascular:     Rate and Rhythm: Normal rate and regular rhythm.     Pulses: Normal pulses.     Heart sounds: Normal heart sounds.  Pulmonary:     Effort: Pulmonary effort is normal. No respiratory distress.     Breath sounds: Normal breath sounds.  Abdominal:     General: Bowel sounds are normal. There is no distension.     Palpations: Abdomen is soft.     Tenderness: There is no abdominal tenderness.  Musculoskeletal:        General: No  swelling or tenderness.  Skin:    General: Skin is warm and dry.     Comments: Rash of bilateral forearms, hands, feet, and face Healing burn on right forearm near wrist Excoriations along distribution of rash on his arms   Neurological:     General: No focal deficit present.     Mental Status: He is alert. Mental status is at baseline.    Lab Results Lab Results  Component Value Date   WBC 5.7 05/12/2018   HGB 7.6 (L) 05/12/2018   HCT 22.6 (L) 05/12/2018   MCV 107.1 (H) 05/12/2018   PLT 89 (L) 05/12/2018    Lab Results  Component Value Date   CREATININE 1.33 (H) 05/12/2018   BUN 14 05/12/2018   NA 142 05/12/2018   K 3.8 05/12/2018   CL 120 (H) 05/12/2018   CO2 14 (L) 05/12/2018    Lab Results  Component Value Date   ALT 28 05/10/2018   AST 62 (H) 05/10/2018   ALKPHOS 91 05/10/2018   BILITOT 3.8 (H) 05/10/2018     Microbiology: Recent Results (from the past 240 hour(s))  Culture, blood (Routine X 2) w Reflex to ID Panel     Status: Abnormal   Collection Time: 05/10/18  4:52 AM  Result Value Ref Range Status   Specimen  Description BLOOD RIGHT ANTECUBITAL  Final   Special Requests   Final    BOTTLES DRAWN AEROBIC AND ANAEROBIC Blood Culture adequate volume   Culture  Setup Time   Final    IN BOTH AEROBIC AND ANAEROBIC BOTTLES GRAM POSITIVE COCCI CRITICAL RESULT CALLED TO, READ BACK BY AND VERIFIED WITHDarene Lamer [facility] PHARMD 05/10/18 2054 JDW Performed at [facility], [address], [place], [place] [address]    Culture STAPHYLOCOCCUS AUREUS (A)  Final   Report Status 05/12/2018 FINAL  Final   Organism ID, Bacteria STAPHYLOCOCCUS AUREUS  Final      Susceptibility   Staphylococcus aureus - MIC*    CIPROFLOXACIN <=0.5 SENSITIVE Sensitive     ERYTHROMYCIN <=0.25 SENSITIVE Sensitive     GENTAMICIN <=0.5 SENSITIVE Sensitive     OXACILLIN 0.5 SENSITIVE Sensitive     TETRACYCLINE <=1 SENSITIVE Sensitive     VANCOMYCIN <=0.5 SENSITIVE Sensitive     TRIMETH/SULFA  <=10 SENSITIVE Sensitive     CLINDAMYCIN <=0.25 SENSITIVE Sensitive     RIFAMPIN <=0.5 SENSITIVE Sensitive     Inducible Clindamycin NEGATIVE Sensitive     * STAPHYLOCOCCUS AUREUS  Culture, blood (Routine X 2) w Reflex to ID Panel     Status: Abnormal   Collection Time: 05/10/18  4:52 AM  Result Value Ref Range Status   Specimen Description BLOOD RIGHT ANTECUBITAL  Final   Special Requests   Final    BOTTLES DRAWN AEROBIC AND ANAEROBIC Blood Culture adequate volume   Culture  Setup Time   Final    IN BOTH AEROBIC AND ANAEROBIC BOTTLES GRAM POSITIVE COCCI CRITICAL VALUE NOTED.  VALUE IS CONSISTENT WITH PREVIOUSLY REPORTED AND CALLED VALUE.    Culture (A)  Final    STAPHYLOCOCCUS AUREUS SUSCEPTIBILITIES PERFORMED ON PREVIOUS CULTURE WITHIN THE LAST 5 DAYS. Performed at [facility], [place] [address] [place], [place] [address]    Report Status 05/12/2018 FINAL  Final  Blood Culture ID Panel (Reflexed)     Status: Abnormal   Collection Time: 05/10/18  4:52 AM  Result Value Ref Range Status   Enterococcus species NOT DETECTED NOT DETECTED Final   Listeria monocytogenes NOT DETECTED NOT DETECTED Final   Staphylococcus species DETECTED (A) NOT DETECTED Final    Comment: CRITICAL RESULT CALLED TO, READ BACK BY AND VERIFIED WITH: T PANGDANG PHARMD 05/10/18 2054 JDW    Staphylococcus aureus (BCID) DETECTED (A) NOT DETECTED Final    Comment: Methicillin (oxacillin) susceptible Staphylococcus aureus (MSSA). Preferred therapy is anti staphylococcal beta lactam antibiotic (Cefazolin or Nafcillin), unless clinically contraindicated. CRITICAL RESULT CALLED TO, READ BACK BY AND VERIFIED WITH: T PANGDANG PHARMD 05/10/18 JDW    Methicillin resistance NOT DETECTED NOT DETECTED Final   Streptococcus species NOT DETECTED NOT DETECTED Final   Streptococcus agalactiae NOT DETECTED NOT DETECTED Final   Streptococcus pneumoniae NOT DETECTED NOT DETECTED Final   Streptococcus pyogenes NOT DETECTED NOT  DETECTED Final   Acinetobacter baumannii NOT DETECTED NOT DETECTED Final   Enterobacteriaceae species NOT DETECTED NOT DETECTED Final   Enterobacter cloacae complex NOT DETECTED NOT DETECTED Final   Escherichia coli NOT DETECTED NOT DETECTED Final   Klebsiella oxytoca NOT DETECTED NOT DETECTED Final   Klebsiella pneumoniae NOT DETECTED NOT DETECTED Final   Proteus species NOT DETECTED NOT DETECTED Final   Serratia marcescens NOT DETECTED NOT DETECTED Final   Haemophilus influenzae NOT DETECTED NOT DETECTED Final   Neisseria meningitidis NOT DETECTED NOT DETECTED Final  Pseudomonas aeruginosa NOT DETECTED NOT DETECTED Final   Candida albicans NOT DETECTED NOT DETECTED Final   Candida glabrata NOT DETECTED NOT DETECTED Final   Candida krusei NOT DETECTED NOT DETECTED Final   Candida parapsilosis NOT DETECTED NOT DETECTED Final   Candida tropicalis NOT DETECTED NOT DETECTED Final    Comment: Performed at [facility], [place] [address], [place], [place] [address]  MRSA PCR Screening     Status: None   Collection Time: 05/10/18 10:21 AM  Result Value Ref Range Status   MRSA by PCR NEGATIVE NEGATIVE Final    Comment:        The GeneXpert MRSA Assay (FDA approved for NASAL specimens only), is one component of a comprehensive MRSA colonization surveillance program. It is not intended to diagnose MRSA infection nor to guide or monitor treatment for MRSA infections. Performed at [facility], [place] [address] [place], [place] [address]     Neva Seat, MD IM PGY-2 [phone] Attending: [phone] pager   [phone] cell 05/12/2018, 9:15 AM

## 2018-05-12 NOTE — Progress Notes (Signed)
Patient ID: Mark [name], male   DOB: August 06, 1955, 63 y.o.   MRN: [id number]  PROGRESS NOTE    Barnett Elzey  IWL:[id number] DOB: 10/14/1955 DOA: 05/10/2018 PCP: [name], MD   Brief Narrative:  63 year old male with history of alcoholic liver cirrhosis with ascites, pancytopenia, hypertension, stroke, CAD, polysubstance abuse (alcohol, heroin, marijuana), CKD stage III presented with altered mental status.  He was found to be combative and needed Haldol and Versed by EMS.  Ammonia level was 203.  Blood cultures was positive for MSSA and he was started on Ancef.  ID was consulted.   Assessment & Plan:   Principal Problem:   MSSA bacteremia Active Problems:   Cirrhosis, alcoholic ([name])   Substance abuse ([place])   CKD (chronic kidney disease) stage 3, GFR 30-59 ml/min ([name])   Hypotension   Pancytopenia ([name])   CAD (coronary artery disease)   Hepatic encephalopathy ([name])   Alcohol abuse   GERD (gastroesophageal reflux disease)   Edentulous   Hepatic encephalopathy in a patient with alcoholic cirrhosis of liver with portal hypertension and ascites -Mental status has much improved.  Continue oral lactulose.  Monitor mental status.  Fall precautions -Continue spironolactone.  Might need to restart Lasix as well.  Outpatient follow-up with GI.  Continue nadolol.  MSSA bacteremia -Most likely skin source.  No signs of peritonitis or urinary infection  -Continue IV Ancef.  ID following.  Will follow 2D echo.  Follow repeat blood cultures  Chronic kidney disease stage III -Stable.  Creatinine ranged from 0.9-1.4 at baseline  Chronic macrocytic anemia -Most likely from alcohol abuse.  No signs of bleeding.  Hemoglobin is 7.6 today.  Substance abuse -UDS positive for narcotics and benzodiazepines  Chronic thrombocytopenia -Most likely from alcohol abuse.  No signs of bleeding.  Monitor  GERD -Continue PPI   DVT prophylaxis: SCDs Code Status: Full Family  Communication: None at bedside Disposition Plan: Home once cleared by ID.  Consultants: ID  Procedures: None  Antimicrobials: Ancef from 05/10/2018 onwards   Subjective: Patient seen and examined at bedside.  He denies worsening abdominal pain, nausea, vomiting or fevers.  Objective: Vitals:   05/11/18 0509 05/11/18 1652 05/11/18 2048 05/12/18 0428  BP: (!) 124/58 126/83 (!) 148/73 (!) 153/77  Pulse: 73 88 (!) 58 64  Resp: 18 20 18 16   Temp: 98 F (36.7 C) (!) 97.5 F (36.4 C) (!) 97.5 F (36.4 C)   TempSrc: Oral Oral Oral   SpO2: 100% 100% 100% 100%  Weight:      Height:        Intake/Output Summary (Last 24 hours) at 05/12/2018 1047 Last data filed at 05/12/2018 0543 Gross per 24 hour  Intake 500 ml  Output -  Net 500 ml   Filed Weights   05/10/18 1626  Weight: 57.9 kg    Examination:  General exam: Appears calm and comfortable.  Awake and alert, poor historian Respiratory system: Bilateral decreased breath sounds at bases Cardiovascular system: S1 & S2 heard, Rate controlled Gastrointestinal system: Abdomen is slightly distended, soft and nontender. Normal bowel sounds heard. Extremities: No cyanosis; trace edema Central nervous system: Alert and oriented. No focal neurological deficits. Moving extremities.  Poor historian  Data Reviewed: I have personally reviewed following labs and imaging studies  CBC: Recent Labs  Lab 05/10/18 0042 05/11/18 0842 05/12/18 0418  WBC 2.8* 5.5 5.7  NEUTROABS 1.7  --   --   HGB 9.9* 7.7* 7.6*  HCT 29.9*  23.1* 22.6*  MCV 106.0* 105.0* 107.1*  PLT 75* 86* 89*   Basic Metabolic Panel: Recent Labs  Lab 05/10/18 0042 05/10/18 0452 05/11/18 0842 05/12/18 0418  NA 139 138 144 142  K 4.8 5.1 3.3* 3.8  CL 114* 112* 120* 120*  CO2 17* 17* 15* 14*  GLUCOSE 87 81 81 142*  BUN 15 14 14 14   CREATININE 1.58* 1.55* 1.44* 1.33*  CALCIUM 8.0* 8.1* 8.3* 8.4*   GFR: Estimated Creatinine Clearance: 46 mL/min (A) (by C-G  formula based on SCr of 1.33 mg/dL (H)). Liver Function Tests: Recent Labs  Lab 05/10/18 0042 05/10/18 0452  AST 52* 62*  ALT 23 28  ALKPHOS 87 91  BILITOT 2.9* 3.8*  PROT 6.0* 6.4*  ALBUMIN 2.1* 2.2*   No results for input(s): LIPASE, AMYLASE in the last 168 hours. Recent Labs  Lab 05/10/18 0042 05/11/18 0842  AMMONIA 203* 27   Coagulation Profile: Recent Labs  Lab 05/10/18 0044  INR 1.47   Cardiac Enzymes: No results for input(s): CKTOTAL, CKMB, CKMBINDEX, TROPONINI in the last 168 hours. BNP (last 3 results) No results for input(s): PROBNP in the last 8760 hours. HbA1C: No results for input(s): HGBA1C in the last 72 hours. CBG: No results for input(s): GLUCAP in the last 168 hours. Lipid Profile: No results for input(s): CHOL, HDL, LDLCALC, TRIG, CHOLHDL, LDLDIRECT in the last 72 hours. Thyroid Function Tests: No results for input(s): TSH, T4TOTAL, FREET4, T3FREE, THYROIDAB in the last 72 hours. Anemia Panel: No results for input(s): VITAMINB12, FOLATE, FERRITIN, TIBC, IRON, RETICCTPCT in the last 72 hours. Sepsis Labs: Recent Labs  Lab 05/10/18 0452  LATICACIDVEN 2.9*    Recent Results (from the past 240 hour(s))  Culture, blood (Routine X 2) w Reflex to ID Panel     Status: Abnormal   Collection Time: 05/10/18  4:52 AM  Result Value Ref Range Status   Specimen Description BLOOD RIGHT ANTECUBITAL  Final   Special Requests   Final    BOTTLES DRAWN AEROBIC AND ANAEROBIC Blood Culture adequate volume   Culture  Setup Time   Final    IN BOTH AEROBIC AND ANAEROBIC BOTTLES GRAM POSITIVE COCCI CRITICAL RESULT CALLED TO, READ BACK BY AND VERIFIED WITHRito Ehrlich PHARMD 05/10/18 2054 JDW Performed at [facility], [place] [address] [place], [place] [address]    Culture STAPHYLOCOCCUS AUREUS (A)  Final   Report Status 05/12/2018 FINAL  Final   Organism ID, Bacteria STAPHYLOCOCCUS AUREUS  Final      Susceptibility   Staphylococcus aureus - MIC*     CIPROFLOXACIN <=0.5 SENSITIVE Sensitive     ERYTHROMYCIN <=0.25 SENSITIVE Sensitive     GENTAMICIN <=0.5 SENSITIVE Sensitive     OXACILLIN 0.5 SENSITIVE Sensitive     TETRACYCLINE <=1 SENSITIVE Sensitive     VANCOMYCIN <=0.5 SENSITIVE Sensitive     TRIMETH/SULFA <=10 SENSITIVE Sensitive     CLINDAMYCIN <=0.25 SENSITIVE Sensitive     RIFAMPIN <=0.5 SENSITIVE Sensitive     Inducible Clindamycin NEGATIVE Sensitive     * STAPHYLOCOCCUS AUREUS  Culture, blood (Routine X 2) w Reflex to ID Panel     Status: Abnormal   Collection Time: 05/10/18  4:52 AM  Result Value Ref Range Status   Specimen Description BLOOD RIGHT ANTECUBITAL  Final   Special Requests   Final    BOTTLES DRAWN AEROBIC AND ANAEROBIC Blood Culture adequate volume   Culture  Setup Time   Final  IN BOTH AEROBIC AND ANAEROBIC BOTTLES GRAM POSITIVE COCCI CRITICAL VALUE NOTED.  VALUE IS CONSISTENT WITH PREVIOUSLY REPORTED AND CALLED VALUE.    Culture (A)  Final    STAPHYLOCOCCUS AUREUS SUSCEPTIBILITIES PERFORMED ON PREVIOUS CULTURE WITHIN THE LAST 5 DAYS. Performed at [facility], [place] [address], [place], [place] [address]    Report Status 05/12/2018 FINAL  Final  Blood Culture ID Panel (Reflexed)     Status: Abnormal   Collection Time: 05/10/18  4:52 AM  Result Value Ref Range Status   Enterococcus species NOT DETECTED NOT DETECTED Final   Listeria monocytogenes NOT DETECTED NOT DETECTED Final   Staphylococcus species DETECTED (A) NOT DETECTED Final    Comment: CRITICAL RESULT CALLED TO, READ BACK BY AND VERIFIED WITH: T PANGDANG PHARMD 05/10/18 2054 JDW    Staphylococcus aureus (BCID) DETECTED (A) NOT DETECTED Final    Comment: Methicillin (oxacillin) susceptible Staphylococcus aureus (MSSA). Preferred therapy is anti staphylococcal beta lactam antibiotic (Cefazolin or Nafcillin), unless clinically contraindicated. CRITICAL RESULT CALLED TO, READ BACK BY AND VERIFIED WITH: T PANGDANG PHARMD 05/10/18 JDW     Methicillin resistance NOT DETECTED NOT DETECTED Final   Streptococcus species NOT DETECTED NOT DETECTED Final   Streptococcus agalactiae NOT DETECTED NOT DETECTED Final   Streptococcus pneumoniae NOT DETECTED NOT DETECTED Final   Streptococcus pyogenes NOT DETECTED NOT DETECTED Final   Acinetobacter baumannii NOT DETECTED NOT DETECTED Final   Enterobacteriaceae species NOT DETECTED NOT DETECTED Final   Enterobacter cloacae complex NOT DETECTED NOT DETECTED Final   Escherichia coli NOT DETECTED NOT DETECTED Final   Klebsiella oxytoca NOT DETECTED NOT DETECTED Final   Klebsiella pneumoniae NOT DETECTED NOT DETECTED Final   Proteus species NOT DETECTED NOT DETECTED Final   Serratia marcescens NOT DETECTED NOT DETECTED Final   Haemophilus influenzae NOT DETECTED NOT DETECTED Final   Neisseria meningitidis NOT DETECTED NOT DETECTED Final   Pseudomonas aeruginosa NOT DETECTED NOT DETECTED Final   Candida albicans NOT DETECTED NOT DETECTED Final   Candida glabrata NOT DETECTED NOT DETECTED Final   Candida krusei NOT DETECTED NOT DETECTED Final   Candida parapsilosis NOT DETECTED NOT DETECTED Final   Candida tropicalis NOT DETECTED NOT DETECTED Final    Comment: Performed at [facility], [place] [address], [place], [place] [address]  MRSA PCR Screening     Status: None   Collection Time: 05/10/18 10:21 AM  Result Value Ref Range Status   MRSA by PCR NEGATIVE NEGATIVE Final    Comment:        The GeneXpert MRSA Assay (FDA approved for NASAL specimens only), is one component of a comprehensive MRSA colonization surveillance program. It is not intended to diagnose MRSA infection nor to guide or monitor treatment for MRSA infections. Performed at [facility], [place] [address] [place], [place] [address]   Culture, blood (routine x 2)     Status: None (Preliminary result)   Collection Time: 05/11/18  3:13 PM  Result Value Ref Range Status   Specimen Description BLOOD LEFT HAND   Final   Special Requests   Final    BOTTLES DRAWN AEROBIC ONLY Blood Culture adequate volume   Culture   Final    NO GROWTH < 24 HOURS Performed at [facility], [place] [address], [place], [place] [address]    Report Status PENDING  Incomplete  Culture, blood (routine x 2)     Status: None (Preliminary result)   Collection Time:  05/11/18  4:00 PM  Result Value Ref Range Status   Specimen Description BLOOD LEFT HAND  Final   Special Requests   Final    BOTTLES DRAWN AEROBIC ONLY Blood Culture adequate volume   Culture   Final    NO GROWTH < 24 HOURS Performed at [facility], [address] [place], [place] [address]    Report Status PENDING  Incomplete         Radiology Studies: No results found.      Scheduled Meds: . lactulose  30 g Oral BID  . nadolol  20 mg Oral Daily  . pantoprazole  40 mg Oral Daily  . spironolactone  100 mg Oral BID  . thiamine  100 mg Oral Daily   Continuous Infusions: .  ceFAZolin (ANCEF) IV 2 g (05/12/18 0543)     LOS: 2 days        Aline August, MD [facility] Pager [phone]  If 7PM-7AM, please contact night-coverage [email or web address] Password [facility] 05/12/2018, 10:47 AM

## 2018-05-13 ENCOUNTER — Encounter (HOSPITAL_COMMUNITY)
Admission: EM | Disposition: A | Discharge status: Home Health | Payer: Self-pay | Source: Home / Self Care | Attending: Internal Medicine

## 2018-05-13 ENCOUNTER — Inpatient Hospital Stay (HOSPITAL_COMMUNITY): Payer: BC Managed Care – PPO | Admitting: Certified Registered Nurse Anesthetist

## 2018-05-13 ENCOUNTER — Inpatient Hospital Stay (HOSPITAL_COMMUNITY): Payer: BC Managed Care – PPO

## 2018-05-13 ENCOUNTER — Encounter (HOSPITAL_COMMUNITY): Payer: Self-pay | Admitting: Certified Registered Nurse Anesthetist

## 2018-05-13 DIAGNOSIS — I340 Nonrheumatic mitral (valve) insufficiency: Secondary | ICD-10-CM

## 2018-05-13 DIAGNOSIS — B955 Unspecified streptococcus as the cause of diseases classified elsewhere: Secondary | ICD-10-CM

## 2018-05-13 HISTORY — PX: TEE WITHOUT CARDIOVERSION: SHX5443

## 2018-05-13 LAB — CBC WITH DIFFERENTIAL/PLATELET
Abs Immature Granulocytes: 0.03 10*3/uL (ref 0.00–0.07)
Abs Immature Granulocytes: 0.03 10*3/uL (ref 0.00–0.07)
Basophils Absolute: 0 10*3/uL (ref 0.0–0.1)
Basophils Absolute: 0 10*3/uL (ref 0.0–0.1)
Basophils Relative: 0 %
Basophils Relative: 0 %
Eosinophils Absolute: 0.1 10*3/uL (ref 0.0–0.5)
Eosinophils Absolute: 0.1 10*3/uL (ref 0.0–0.5)
Eosinophils Relative: 1 %
Eosinophils Relative: 1 %
HCT: 24 % — ABNORMAL LOW (ref 39.0–52.0)
HCT: 28.6 % — ABNORMAL LOW (ref 39.0–52.0)
Hemoglobin: 8.1 g/dL — ABNORMAL LOW (ref 13.0–17.0)
Hemoglobin: 9.1 g/dL — ABNORMAL LOW (ref 13.0–17.0)
Immature Granulocytes: 0 %
Immature Granulocytes: 0 %
Lymphocytes Relative: 15 %
Lymphocytes Relative: 16 %
Lymphs Abs: 1.3 10*3/uL (ref 0.7–4.0)
Lymphs Abs: 1.4 10*3/uL (ref 0.7–4.0)
MCH: 35.1 pg — ABNORMAL HIGH (ref 26.0–34.0)
MCH: 34.9 pg — ABNORMAL HIGH (ref 26.0–34.0)
MCHC: 33.8 g/dL (ref 30.0–36.0)
MCHC: 31.8 g/dL (ref 30.0–36.0)
MCV: 103.9 fL — ABNORMAL HIGH (ref 80.0–100.0)
MCV: 109.6 fL — ABNORMAL HIGH (ref 80.0–100.0)
Monocytes Absolute: 0.7 10*3/uL (ref 0.1–1.0)
Monocytes Absolute: 0.7 10*3/uL (ref 0.1–1.0)
Monocytes Relative: 9 %
Monocytes Relative: 8 %
Neutro Abs: 6.1 10*3/uL (ref 1.7–7.7)
Neutro Abs: 6.5 10*3/uL (ref 1.7–7.7)
Neutrophils Relative %: 75 %
Neutrophils Relative %: 75 %
Platelets: 95 10*3/uL — ABNORMAL LOW (ref 150–400)
Platelets: 89 10*3/uL — ABNORMAL LOW (ref 150–400)
RBC: 2.31 MIL/uL — ABNORMAL LOW (ref 4.22–5.81)
RBC: 2.61 MIL/uL — ABNORMAL LOW (ref 4.22–5.81)
RDW: 15.7 % — ABNORMAL HIGH (ref 11.5–15.5)
RDW: 15.9 % — ABNORMAL HIGH (ref 11.5–15.5)
WBC: 8.1 10*3/uL (ref 4.0–10.5)
WBC: 8.6 10*3/uL (ref 4.0–10.5)
nRBC: 0.2 % (ref 0.0–0.2)
nRBC: 0 % (ref 0.0–0.2)

## 2018-05-13 LAB — COMPREHENSIVE METABOLIC PANEL
ALT: 14 U/L (ref 0–44)
AST: 31 U/L (ref 15–41)
Albumin: 2.6 g/dL — ABNORMAL LOW (ref 3.5–5.0)
Alkaline Phosphatase: 61 U/L (ref 38–126)
Anion gap: 9 (ref 5–15)
BUN: 13 mg/dL (ref 8–23)
CO2: 16 mmol/L — ABNORMAL LOW (ref 22–32)
Calcium: 8.5 mg/dL — ABNORMAL LOW (ref 8.9–10.3)
Chloride: 115 mmol/L — ABNORMAL HIGH (ref 98–111)
Creatinine, Ser: 1.35 mg/dL — ABNORMAL HIGH (ref 0.61–1.24)
GFR calc Af Amer: >60 mL/min (ref >60)
GFR calc non Af Amer: 55 mL/min — ABNORMAL LOW (ref >60)
Glucose, Bld: 97 mg/dL (ref 70–99)
Potassium: 3.8 mmol/L (ref 3.5–5.1)
Sodium: 140 mmol/L (ref 135–145)
Total Bilirubin: 1.1 mg/dL (ref 0.3–1.2)
Total Protein: 6.4 g/dL — ABNORMAL LOW (ref 6.5–8.1)

## 2018-05-13 LAB — MAGNESIUM: Magnesium: 1.6 mg/dL — ABNORMAL LOW (ref 1.7–2.4)

## 2018-05-13 SURGERY — ECHOCARDIOGRAM, TRANSESOPHAGEAL
Anesthesia: Monitor Anesthesia Care

## 2018-05-13 MED ORDER — BUTAMBEN-TETRACAINE-BENZOCAINE 2-2-14 % EX AERO
INHALATION_SPRAY | CUTANEOUS | Status: DC | PRN
  Administered 2018-05-13: 2 via TOPICAL

## 2018-05-13 MED ORDER — PROPOFOL 10 MG/ML IV BOLUS
INTRAVENOUS | Status: DC | PRN
  Administered 2018-05-13 (×3): 10 mg via INTRAVENOUS

## 2018-05-13 MED ORDER — PROPOFOL 500 MG/50ML IV EMUL
INTRAVENOUS | Status: DC | PRN
  Administered 2018-05-13: 11:00:00 via INTRAVENOUS
  Administered 2018-05-13: 150 ug/kg/min via INTRAVENOUS

## 2018-05-13 MED ORDER — ZOLPIDEM TARTRATE 5 MG PO TABS
5.0000 mg | ORAL_TABLET | Freq: Every evening | ORAL | Status: DC | PRN
  Administered 2018-05-14: 5 mg via ORAL
  Filled 2018-05-13: qty 1

## 2018-05-13 MED ORDER — SODIUM CHLORIDE 0.9 % IV SOLN
INTRAVENOUS | Status: DC | PRN
  Administered 2018-05-13: 10:00:00 via INTRAVENOUS

## 2018-05-13 NOTE — H&P (View-Only) (Signed)
Patient ID: Mark [name], male   DOB: [birth year], 63 y.o.   MRN: [id number]  PROGRESS NOTE    Mark [name]  KAJ:[id number] DOB: Apr 13, 1956 DOA: 05/10/2018 PCP: [name], MD   Brief Narrative:  63 year old male with history of alcoholic liver cirrhosis with ascites, pancytopenia, hypertension, stroke, CAD, polysubstance abuse (alcohol, heroin, marijuana), CKD stage III presented with altered mental status.  He was found to be combative and needed Haldol and Versed by EMS.  Ammonia level was 203.  Blood cultures was positive for MSSA and he was started on Ancef.  ID was consulted.   Assessment & Plan:   Principal Problem:   MSSA bacteremia Active Problems:   Cirrhosis, alcoholic ([name])   Substance abuse ([place])   CKD (chronic kidney disease) stage 3, GFR 30-59 ml/min ([name])   Hypotension   Pancytopenia ([name])   CAD (coronary artery disease)   Hepatic encephalopathy ([name])   Alcohol abuse   GERD (gastroesophageal reflux disease)   Edentulous   Hepatic encephalopathy in a patient with alcoholic cirrhosis of liver with portal hypertension and ascites -Mental status has much improved.  Continue oral lactulose. Patient is intermittently refusing to take lactulose; counseled him about the importance of compliance.  Monitor mental status.  Fall precautions -Continue spironolactone and lasix.  Outpatient follow-up with GI.  Continue nadolol.  MSSA bacteremia -Most likely skin source.  No signs of peritonitis or urinary infection  -Continue IV Ancef.  ID following. 2 d echo was negative for vegetations. For TEE today.  Follow repeat blood cultures  Chronic kidney disease stage III -Stable.  Creatinine ranged from 0.9-1.4 at baseline  Hypomagnesemia -Replace. Repeat AM labs  Chronic macrocytic anemia -Most likely from alcohol abuse.  No signs of bleeding.  Hemoglobin is 7.6 today.  Substance abuse -UDS positive for narcotics and benzodiazepines  Chronic  thrombocytopenia -Most likely from alcohol abuse.  No signs of bleeding.  Monitor  GERD -Continue PPI   DVT prophylaxis: SCDs Code Status: Full Family Communication: None at bedside Disposition Plan: Home once cleared by ID.  Consultants: ID  Procedures: None  Antimicrobials: Ancef from 05/10/2018 onwards   Subjective: Patient seen and examined at bedside. He denies worsening shortness of breath, fever, nausea or vomiting. He is having bowel movement.s Objective: Vitals:   05/12/18 1633 05/12/18 2101 05/13/18 0429 05/13/18 0915  BP: (!) 145/60 139/67 (!) 148/71 (!) 176/74  Pulse: (!) 54 (!) 59 60   Resp:  16  18  Temp: 97.9 F (36.6 C) 98 F (36.7 C) 98.1 F (36.7 C) 98.1 F (36.7 C)  TempSrc: Oral Oral Oral Oral  SpO2: 100% 100% 100% 100%  Weight:    57.9 kg  Height:    5\' 8"  (1.727 m)    Intake/Output Summary (Last 24 hours) at 05/13/2018 0949 Last data filed at 05/12/2018 1538 Gross per 24 hour  Intake 200 ml  Output -  Net 200 ml   Filed Weights   05/10/18 1626 05/13/18 0915  Weight: 57.9 kg 57.9 kg    Examination:  General exam: Appears calm and comfortable.  Awake and alert, poor historian. No distress Respiratory system: Bilateral decreased breath sounds at bases, no wheezing Cardiovascular system: rate controlled, S1 S2 heard Gastrointestinal system: Abdomen is slightly distended, soft and nontender. Normal bowel sounds heard. Extremities: No cyanosis; trace edema  Data Reviewed: I have personally reviewed following labs and imaging studies  CBC: Recent Labs  Lab 05/10/18 0042 05/11/18 0842 05/12/18 0418 05/13/18  0310  WBC 2.8* 5.5 5.7 8.1  NEUTROABS 1.7  --   --  6.1  HGB 9.9* 7.7* 7.6* 8.1*  HCT 29.9* 23.1* 22.6* 24.0*  MCV 106.0* 105.0* 107.1* 103.9*  PLT 75* 86* 89* 95*   Basic Metabolic Panel: Recent Labs  Lab 05/10/18 0042 05/10/18 0452 05/11/18 0842 05/12/18 0418 05/13/18 0310  NA 139 138 144 142 140  K 4.8 5.1 3.3* 3.8  3.8  CL 114* 112* 120* 120* 115*  CO2 17* 17* 15* 14* 16*  GLUCOSE 87 81 81 142* 97  BUN 15 14 14 14 13   CREATININE 1.58* 1.55* 1.44* 1.33* 1.35*  CALCIUM 8.0* 8.1* 8.3* 8.4* 8.5*  MG  --   --   --   --  1.6*   GFR: Estimated Creatinine Clearance: 45.3 mL/min (A) (by C-G formula based on SCr of 1.35 mg/dL (H)). Liver Function Tests: Recent Labs  Lab 05/10/18 0042 05/10/18 0452 05/13/18 0310  AST 52* 62* 31  ALT 23 28 14   ALKPHOS 87 91 61  BILITOT 2.9* 3.8* 1.1  PROT 6.0* 6.4* 6.4*  ALBUMIN 2.1* 2.2* 2.6*   No results for input(s): LIPASE, AMYLASE in the last 168 hours. Recent Labs  Lab 05/10/18 0042 05/11/18 0842  AMMONIA 203* 27   Coagulation Profile: Recent Labs  Lab 05/10/18 0044  INR 1.47   Cardiac Enzymes: No results for input(s): CKTOTAL, CKMB, CKMBINDEX, TROPONINI in the last 168 hours. BNP (last 3 results) No results for input(s): PROBNP in the last 8760 hours. HbA1C: No results for input(s): HGBA1C in the last 72 hours. CBG: No results for input(s): GLUCAP in the last 168 hours. Lipid Profile: No results for input(s): CHOL, HDL, LDLCALC, TRIG, CHOLHDL, LDLDIRECT in the last 72 hours. Thyroid Function Tests: No results for input(s): TSH, T4TOTAL, FREET4, T3FREE, THYROIDAB in the last 72 hours. Anemia Panel: No results for input(s): VITAMINB12, FOLATE, FERRITIN, TIBC, IRON, RETICCTPCT in the last 72 hours. Sepsis Labs: Recent Labs  Lab 05/10/18 0452  LATICACIDVEN 2.9*    Recent Results (from the past 240 hour(s))  Culture, blood (Routine X 2) w Reflex to ID Panel     Status: Abnormal   Collection Time: 05/10/18  4:52 AM  Result Value Ref Range Status   Specimen Description BLOOD RIGHT ANTECUBITAL  Final   Special Requests   Final    BOTTLES DRAWN AEROBIC AND ANAEROBIC Blood Culture adequate volume   Culture  Setup Time   Final    IN BOTH AEROBIC AND ANAEROBIC BOTTLES GRAM POSITIVE COCCI CRITICAL RESULT CALLED TO, READ BACK BY AND VERIFIED  WITHRito Ehrlich PHARMD 05/10/18 2054 JDW Performed at [facility], [place] [address], [place], [place] [address]    Culture STAPHYLOCOCCUS AUREUS (A)  Final   Report Status 05/12/2018 FINAL  Final   Organism ID, Bacteria STAPHYLOCOCCUS AUREUS  Final      Susceptibility   Staphylococcus aureus - MIC*    CIPROFLOXACIN <=0.5 SENSITIVE Sensitive     ERYTHROMYCIN <=0.25 SENSITIVE Sensitive     GENTAMICIN <=0.5 SENSITIVE Sensitive     OXACILLIN 0.5 SENSITIVE Sensitive     TETRACYCLINE <=1 SENSITIVE Sensitive     VANCOMYCIN <=0.5 SENSITIVE Sensitive     TRIMETH/SULFA <=10 SENSITIVE Sensitive     CLINDAMYCIN <=0.25 SENSITIVE Sensitive     RIFAMPIN <=0.5 SENSITIVE Sensitive     Inducible Clindamycin NEGATIVE Sensitive     * STAPHYLOCOCCUS AUREUS  Culture, blood (Routine X 2) w Reflex to  ID Panel     Status: Abnormal   Collection Time: 05/10/18  4:52 AM  Result Value Ref Range Status   Specimen Description BLOOD RIGHT ANTECUBITAL  Final   Special Requests   Final    BOTTLES DRAWN AEROBIC AND ANAEROBIC Blood Culture adequate volume   Culture  Setup Time   Final    IN BOTH AEROBIC AND ANAEROBIC BOTTLES GRAM POSITIVE COCCI CRITICAL VALUE NOTED.  VALUE IS CONSISTENT WITH PREVIOUSLY REPORTED AND CALLED VALUE.    Culture (A)  Final    STAPHYLOCOCCUS AUREUS SUSCEPTIBILITIES PERFORMED ON PREVIOUS CULTURE WITHIN THE LAST 5 DAYS. Performed at [facility], [place] [address], [place], [place] [address]    Report Status 05/12/2018 FINAL  Final  Blood Culture ID Panel (Reflexed)     Status: Abnormal   Collection Time: 05/10/18  4:52 AM  Result Value Ref Range Status   Enterococcus species NOT DETECTED NOT DETECTED Final   Listeria monocytogenes NOT DETECTED NOT DETECTED Final   Staphylococcus species DETECTED (A) NOT DETECTED Final    Comment: CRITICAL RESULT CALLED TO, READ BACK BY AND VERIFIED WITH: T PANGDANG PHARMD 05/10/18 2054 JDW    Staphylococcus aureus (BCID) DETECTED (A) NOT  DETECTED Final    Comment: Methicillin (oxacillin) susceptible Staphylococcus aureus (MSSA). Preferred therapy is anti staphylococcal beta lactam antibiotic (Cefazolin or Nafcillin), unless clinically contraindicated. CRITICAL RESULT CALLED TO, READ BACK BY AND VERIFIED WITH: T PANGDANG PHARMD 05/10/18 JDW    Methicillin resistance NOT DETECTED NOT DETECTED Final   Streptococcus species NOT DETECTED NOT DETECTED Final   Streptococcus agalactiae NOT DETECTED NOT DETECTED Final   Streptococcus pneumoniae NOT DETECTED NOT DETECTED Final   Streptococcus pyogenes NOT DETECTED NOT DETECTED Final   Acinetobacter baumannii NOT DETECTED NOT DETECTED Final   Enterobacteriaceae species NOT DETECTED NOT DETECTED Final   Enterobacter cloacae complex NOT DETECTED NOT DETECTED Final   Escherichia coli NOT DETECTED NOT DETECTED Final   Klebsiella oxytoca NOT DETECTED NOT DETECTED Final   Klebsiella pneumoniae NOT DETECTED NOT DETECTED Final   Proteus species NOT DETECTED NOT DETECTED Final   Serratia marcescens NOT DETECTED NOT DETECTED Final   Haemophilus influenzae NOT DETECTED NOT DETECTED Final   Neisseria meningitidis NOT DETECTED NOT DETECTED Final   Pseudomonas aeruginosa NOT DETECTED NOT DETECTED Final   Candida albicans NOT DETECTED NOT DETECTED Final   Candida glabrata NOT DETECTED NOT DETECTED Final   Candida krusei NOT DETECTED NOT DETECTED Final   Candida parapsilosis NOT DETECTED NOT DETECTED Final   Candida tropicalis NOT DETECTED NOT DETECTED Final    Comment: Performed at [facility], [place] [address], [place], [place] [address]  MRSA PCR Screening     Status: None   Collection Time: 05/10/18 10:21 AM  Result Value Ref Range Status   MRSA by PCR NEGATIVE NEGATIVE Final    Comment:        The GeneXpert MRSA Assay (FDA approved for NASAL specimens only), is one component of a comprehensive MRSA colonization surveillance program. It is not intended to diagnose MRSA infection  nor to guide or monitor treatment for MRSA infections. Performed at [facility], [place] [address] [place], [place] [address]   Culture, blood (routine x 2)     Status: None (Preliminary result)   Collection Time: 05/11/18  3:13 PM  Result Value Ref Range Status   Specimen Description BLOOD LEFT HAND  Final   Special Requests   Final  BOTTLES DRAWN AEROBIC ONLY Blood Culture adequate volume   Culture   Final    NO GROWTH 2 DAYS Performed at [facility], [place] [address], [place], [place] [address]    Report Status PENDING  Incomplete  Culture, blood (routine x 2)     Status: None (Preliminary result)   Collection Time: 05/11/18  4:00 PM  Result Value Ref Range Status   Specimen Description BLOOD LEFT HAND  Final   Special Requests   Final    BOTTLES DRAWN AEROBIC ONLY Blood Culture adequate volume   Culture   Final    NO GROWTH 2 DAYS Performed at [facility], [place] [address] [place], [place] [address]    Report Status PENDING  Incomplete         Radiology Studies: No results found.      Scheduled Meds: . [MAR Hold] furosemide  20 mg Oral BID  . [MAR Hold] lactulose  30 g Oral BID  . [MAR Hold] nadolol  20 mg Oral Daily  . [MAR Hold] pantoprazole  40 mg Oral Daily  . [MAR Hold] spironolactone  100 mg Oral BID  . [MAR Hold] thiamine  100 mg Oral Daily   Continuous Infusions: . sodium chloride 20 mL/hr at 05/12/18 2212  . [MAR Hold]  ceFAZolin (ANCEF) IV 2 g (05/13/18 0706)     LOS: 3 days        Aline August, MD [facility] Pager [phone]  If 7PM-7AM, please contact night-coverage [email or web address] Password [facility] 05/13/2018, 9:49 AM

## 2018-05-13 NOTE — Progress Notes (Signed)
Patient ID: Mark [name], male   DOB: 1956/04/15, 63 y.o.   MRN: [id number]  PROGRESS NOTE    Mark [name]  WYO:[id number] DOB: [birth year] DOA: 05/10/2018 PCP: [name], MD   Brief Narrative:  63 year old male with history of alcoholic liver cirrhosis with ascites, pancytopenia, hypertension, stroke, CAD, polysubstance abuse (alcohol, heroin, marijuana), CKD stage III presented with altered mental status.  He was found to be combative and needed Haldol and Versed by EMS.  Ammonia level was 203.  Blood cultures was positive for MSSA and he was started on Ancef.  ID was consulted.   Assessment & Plan:   Principal Problem:   MSSA bacteremia Active Problems:   Cirrhosis, alcoholic ([name])   Substance abuse ([place])   CKD (chronic kidney disease) stage 3, GFR 30-59 ml/min ([name])   Hypotension   Pancytopenia ([name])   CAD (coronary artery disease)   Hepatic encephalopathy ([name])   Alcohol abuse   GERD (gastroesophageal reflux disease)   Edentulous   Hepatic encephalopathy in a patient with alcoholic cirrhosis of liver with portal hypertension and ascites -Mental status has much improved.  Continue oral lactulose. Patient is intermittently refusing to take lactulose; counseled him about the importance of compliance.  Monitor mental status.  Fall precautions -Continue spironolactone and lasix.  Outpatient follow-up with GI.  Continue nadolol.  MSSA bacteremia -Most likely skin source.  No signs of peritonitis or urinary infection  -Continue IV Ancef.  ID following. 2 d echo was negative for vegetations. For TEE today.  Follow repeat blood cultures  Chronic kidney disease stage III -Stable.  Creatinine ranged from 0.9-1.4 at baseline  Hypomagnesemia -Replace. Repeat AM labs  Chronic macrocytic anemia -Most likely from alcohol abuse.  No signs of bleeding.  Hemoglobin is 7.6 today.  Substance abuse -UDS positive for narcotics and benzodiazepines  Chronic  thrombocytopenia -Most likely from alcohol abuse.  No signs of bleeding.  Monitor  GERD -Continue PPI   DVT prophylaxis: SCDs Code Status: Full Family Communication: None at bedside Disposition Plan: Home once cleared by ID.  Consultants: ID  Procedures: None  Antimicrobials: Ancef from 05/10/2018 onwards   Subjective: Patient seen and examined at bedside. He denies worsening shortness of breath, fever, nausea or vomiting. He is having bowel movement.s Objective: Vitals:   05/12/18 1633 05/12/18 2101 05/13/18 0429 05/13/18 0915  BP: (!) 145/60 139/67 (!) 148/71 (!) 176/74  Pulse: (!) 54 (!) 59 60   Resp:  16  18  Temp: 97.9 F (36.6 C) 98 F (36.7 C) 98.1 F (36.7 C) 98.1 F (36.7 C)  TempSrc: Oral Oral Oral Oral  SpO2: 100% 100% 100% 100%  Weight:    57.9 kg  Height:    5\' 8"  (1.727 m)    Intake/Output Summary (Last 24 hours) at 05/13/2018 0949 Last data filed at 05/12/2018 1538 Gross per 24 hour  Intake 200 ml  Output -  Net 200 ml   Filed Weights   05/10/18 1626 05/13/18 0915  Weight: 57.9 kg 57.9 kg    Examination:  General exam: Appears calm and comfortable.  Awake and alert, poor historian. No distress Respiratory system: Bilateral decreased breath sounds at bases, no wheezing Cardiovascular system: rate controlled, S1 S2 heard Gastrointestinal system: Abdomen is slightly distended, soft and nontender. Normal bowel sounds heard. Extremities: No cyanosis; trace edema  Data Reviewed: I have personally reviewed following labs and imaging studies  CBC: Recent Labs  Lab 05/10/18 0042 05/11/18 0842 05/12/18 0418 05/13/18  0310  WBC 2.8* 5.5 5.7 8.1  NEUTROABS 1.7  --   --  6.1  HGB 9.9* 7.7* 7.6* 8.1*  HCT 29.9* 23.1* 22.6* 24.0*  MCV 106.0* 105.0* 107.1* 103.9*  PLT 75* 86* 89* 95*   Basic Metabolic Panel: Recent Labs  Lab 05/10/18 0042 05/10/18 0452 05/11/18 0842 05/12/18 0418 05/13/18 0310  NA 139 138 144 142 140  K 4.8 5.1 3.3* 3.8  3.8  CL 114* 112* 120* 120* 115*  CO2 17* 17* 15* 14* 16*  GLUCOSE 87 81 81 142* 97  BUN 15 14 14 14 13   CREATININE 1.58* 1.55* 1.44* 1.33* 1.35*  CALCIUM 8.0* 8.1* 8.3* 8.4* 8.5*  MG  --   --   --   --  1.6*   GFR: Estimated Creatinine Clearance: 45.3 mL/min (A) (by C-G formula based on SCr of 1.35 mg/dL (H)). Liver Function Tests: Recent Labs  Lab 05/10/18 0042 05/10/18 0452 05/13/18 0310  AST 52* 62* 31  ALT 23 28 14   ALKPHOS 87 91 61  BILITOT 2.9* 3.8* 1.1  PROT 6.0* 6.4* 6.4*  ALBUMIN 2.1* 2.2* 2.6*   No results for input(s): LIPASE, AMYLASE in the last 168 hours. Recent Labs  Lab 05/10/18 0042 05/11/18 0842  AMMONIA 203* 27   Coagulation Profile: Recent Labs  Lab 05/10/18 0044  INR 1.47   Cardiac Enzymes: No results for input(s): CKTOTAL, CKMB, CKMBINDEX, TROPONINI in the last 168 hours. BNP (last 3 results) No results for input(s): PROBNP in the last 8760 hours. HbA1C: No results for input(s): HGBA1C in the last 72 hours. CBG: No results for input(s): GLUCAP in the last 168 hours. Lipid Profile: No results for input(s): CHOL, HDL, LDLCALC, TRIG, CHOLHDL, LDLDIRECT in the last 72 hours. Thyroid Function Tests: No results for input(s): TSH, T4TOTAL, FREET4, T3FREE, THYROIDAB in the last 72 hours. Anemia Panel: No results for input(s): VITAMINB12, FOLATE, FERRITIN, TIBC, IRON, RETICCTPCT in the last 72 hours. Sepsis Labs: Recent Labs  Lab 05/10/18 0452  LATICACIDVEN 2.9*    Recent Results (from the past 240 hour(s))  Culture, blood (Routine X 2) w Reflex to ID Panel     Status: Abnormal   Collection Time: 05/10/18  4:52 AM  Result Value Ref Range Status   Specimen Description BLOOD RIGHT ANTECUBITAL  Final   Special Requests   Final    BOTTLES DRAWN AEROBIC AND ANAEROBIC Blood Culture adequate volume   Culture  Setup Time   Final    IN BOTH AEROBIC AND ANAEROBIC BOTTLES GRAM POSITIVE COCCI CRITICAL RESULT CALLED TO, READ BACK BY AND VERIFIED  WITHRito Ehrlich PHARMD 05/10/18 2054 JDW Performed at [facility], [place] [address] [place], [place] [address]    Culture STAPHYLOCOCCUS AUREUS (A)  Final   Report Status 05/12/2018 FINAL  Final   Organism ID, Bacteria STAPHYLOCOCCUS AUREUS  Final      Susceptibility   Staphylococcus aureus - MIC*    CIPROFLOXACIN <=0.5 SENSITIVE Sensitive     ERYTHROMYCIN <=0.25 SENSITIVE Sensitive     GENTAMICIN <=0.5 SENSITIVE Sensitive     OXACILLIN 0.5 SENSITIVE Sensitive     TETRACYCLINE <=1 SENSITIVE Sensitive     VANCOMYCIN <=0.5 SENSITIVE Sensitive     TRIMETH/SULFA <=10 SENSITIVE Sensitive     CLINDAMYCIN <=0.25 SENSITIVE Sensitive     RIFAMPIN <=0.5 SENSITIVE Sensitive     Inducible Clindamycin NEGATIVE Sensitive     * STAPHYLOCOCCUS AUREUS  Culture, blood (Routine X 2) w Reflex to  ID Panel     Status: Abnormal   Collection Time: 05/10/18  4:52 AM  Result Value Ref Range Status   Specimen Description BLOOD RIGHT ANTECUBITAL  Final   Special Requests   Final    BOTTLES DRAWN AEROBIC AND ANAEROBIC Blood Culture adequate volume   Culture  Setup Time   Final    IN BOTH AEROBIC AND ANAEROBIC BOTTLES GRAM POSITIVE COCCI CRITICAL VALUE NOTED.  VALUE IS CONSISTENT WITH PREVIOUSLY REPORTED AND CALLED VALUE.    Culture (A)  Final    STAPHYLOCOCCUS AUREUS SUSCEPTIBILITIES PERFORMED ON PREVIOUS CULTURE WITHIN THE LAST 5 DAYS. Performed at [facility], [place] [address], [place], [place] [address]    Report Status 05/12/2018 FINAL  Final  Blood Culture ID Panel (Reflexed)     Status: Abnormal   Collection Time: 05/10/18  4:52 AM  Result Value Ref Range Status   Enterococcus species NOT DETECTED NOT DETECTED Final   Listeria monocytogenes NOT DETECTED NOT DETECTED Final   Staphylococcus species DETECTED (A) NOT DETECTED Final    Comment: CRITICAL RESULT CALLED TO, READ BACK BY AND VERIFIED WITH: T PANGDANG PHARMD 05/10/18 2054 JDW    Staphylococcus aureus (BCID) DETECTED (A) NOT  DETECTED Final    Comment: Methicillin (oxacillin) susceptible Staphylococcus aureus (MSSA). Preferred therapy is anti staphylococcal beta lactam antibiotic (Cefazolin or Nafcillin), unless clinically contraindicated. CRITICAL RESULT CALLED TO, READ BACK BY AND VERIFIED WITH: T PANGDANG PHARMD 05/10/18 JDW    Methicillin resistance NOT DETECTED NOT DETECTED Final   Streptococcus species NOT DETECTED NOT DETECTED Final   Streptococcus agalactiae NOT DETECTED NOT DETECTED Final   Streptococcus pneumoniae NOT DETECTED NOT DETECTED Final   Streptococcus pyogenes NOT DETECTED NOT DETECTED Final   Acinetobacter baumannii NOT DETECTED NOT DETECTED Final   Enterobacteriaceae species NOT DETECTED NOT DETECTED Final   Enterobacter cloacae complex NOT DETECTED NOT DETECTED Final   Escherichia coli NOT DETECTED NOT DETECTED Final   Klebsiella oxytoca NOT DETECTED NOT DETECTED Final   Klebsiella pneumoniae NOT DETECTED NOT DETECTED Final   Proteus species NOT DETECTED NOT DETECTED Final   Serratia marcescens NOT DETECTED NOT DETECTED Final   Haemophilus influenzae NOT DETECTED NOT DETECTED Final   Neisseria meningitidis NOT DETECTED NOT DETECTED Final   Pseudomonas aeruginosa NOT DETECTED NOT DETECTED Final   Candida albicans NOT DETECTED NOT DETECTED Final   Candida glabrata NOT DETECTED NOT DETECTED Final   Candida krusei NOT DETECTED NOT DETECTED Final   Candida parapsilosis NOT DETECTED NOT DETECTED Final   Candida tropicalis NOT DETECTED NOT DETECTED Final    Comment: Performed at [facility], [place] [address] [place], [place] [address]  MRSA PCR Screening     Status: None   Collection Time: 05/10/18 10:21 AM  Result Value Ref Range Status   MRSA by PCR NEGATIVE NEGATIVE Final    Comment:        The GeneXpert MRSA Assay (FDA approved for NASAL specimens only), is one component of a comprehensive MRSA colonization surveillance program. It is not intended to diagnose MRSA infection  nor to guide or monitor treatment for MRSA infections. Performed at [facility], [place] [address], [place], [place] [address]   Culture, blood (routine x 2)     Status: None (Preliminary result)   Collection Time: 05/11/18  3:13 PM  Result Value Ref Range Status   Specimen Description BLOOD LEFT HAND  Final   Special Requests   Final  BOTTLES DRAWN AEROBIC ONLY Blood Culture adequate volume   Culture   Final    NO GROWTH 2 DAYS Performed at [facility], [place] [address] [name], [place] [address]    Report Status PENDING  Incomplete  Culture, blood (routine x 2)     Status: None (Preliminary result)   Collection Time: 05/11/18  4:00 PM  Result Value Ref Range Status   Specimen Description BLOOD LEFT HAND  Final   Special Requests   Final    BOTTLES DRAWN AEROBIC ONLY Blood Culture adequate volume   Culture   Final    NO GROWTH 2 DAYS Performed at [facility], [place] [address] [place], [place] [address]    Report Status PENDING  Incomplete         Radiology Studies: No results found.      Scheduled Meds: . [MAR Hold] furosemide  20 mg Oral BID  . [MAR Hold] lactulose  30 g Oral BID  . [MAR Hold] nadolol  20 mg Oral Daily  . [MAR Hold] pantoprazole  40 mg Oral Daily  . [MAR Hold] spironolactone  100 mg Oral BID  . [MAR Hold] thiamine  100 mg Oral Daily   Continuous Infusions: . sodium chloride 20 mL/hr at 05/12/18 2212  . [MAR Hold]  ceFAZolin (ANCEF) IV 2 g (05/13/18 0706)     LOS: 3 days        Aline August, MD [facility] Pager [phone]  If 7PM-7AM, please contact night-coverage [email or web address] Password [facility] 05/13/2018, 9:49 AM

## 2018-05-13 NOTE — Consult Note (Addendum)
Cardiology Consultation:   Patient ID: Mark [name] MRN: [id number]; DOB: 27-Feb-1956  Admit date: 05/10/2018 Date of Consult: 05/13/2018  Primary Care Provider: [name], MD Primary Cardiologist: Mark Rouge, MD  Primary Electrophysiologist:  None    Patient Profile:   Mark [name] is a 63 y.o. male with a hx of alcoholic liver cirrhosis with ascites, pancytopenia, hypertension, stroke, CAD, polysubstance abuse (alcohol, heroin, marijuana), CKD stage III who is being seen today for the evaluation of severe mitral regurgitation at the request of Mark [name].  History of Present Illness:   Mark [name] has been seen in the past, last 11/2014, by Mark [name] to follow mitral regurgitation.  Echo showed normal EF 60-65% with mild mitral regurgitation 08/30/2014.  His edema was controlled with Aldactone and Lasix at the time.  He was planned for a follow-up echo in a year but this was not done.  Patient has MI noted [name] his medical history from 2012  The patient was admitted to the hospital [name] 05/10/2018 with altered mental status.  Ammonia level was 203.  Blood cultures were positive for MSSA and he was started [name] Ancef.  Felt to be most likely a skin source, no signs of peritonitis or urinary infection.  ID was consulted.  Till status has improved [name] lactulose.  He is continued [name] Spironolactone and Lasix.  The patient has chronic microcytic anemia most likely related to alcohol abuse.  Hemoglobin is 7.6.  UDS was positive for narcotics and benzodiazepines.  TTE [name] 05/12/2018 showed EF 60-65%, no regional wall motion abnormalities, mild mitral regurgitation, no obvious vegetation, suggested TEE.  TEE done today, 05/13/18, revealing no evidence of endocarditis, likely moderate-severe regurgitation and mild aortic atherosclerosis.  Cardiology has been asked to consult for severe mitral regurgitation.   Mark [name] tells me that he lives with his wife.  He says that he does some  housework, vacuuming, washing dishes and yard work like getting firewood.  He heats his home with wood fires.  He has had no chest discomfort or shortness of breath with these activities.  He denies any palpitations, lightheadedness, orthopnea, PND, lower extremity edema.  He does admit to occasionally smoking some heroine but he denies any IV drug use.  Patient had severe sepsis secondary to group a strep bacteremia, right lower extremity cellulitis in 05/2017 with acute encephalopathy and acute kidney injury with creatinine 6.5 [name] admission.  Past Medical History:  Diagnosis Date  . Alcohol abuse   . Anemia   . Arthritis   . Ascites   . Cirrhosis ([place])   . Coffee ground emesis   . Dehydration 06/17/2017  . Febrile illness   . Heart murmur   . Hyperlipidemia   . Hypertension   . Leg swelling   . Myocardial infarction ([place]) 2012  . Preop cardiovascular exam 04/14/2013  . Sepsis ([place]) 06/17/2017  . Septic shock ([place]) 06/18/2017  . SIRS (systemic inflammatory response syndrome) ([place]) 07/11/2017  . Stroke [name]) 2012   no deficits  . Thrombocytopenia ([place])     Past Surgical History:  Procedure Laterality Date  . COLONOSCOPY WITH PROPOFOL N/A 02/07/2016   Procedure: COLONOSCOPY WITH PROPOFOL;  Surgeon: Mark Banister, MD;  Location: [facility];  Service: Endoscopy;  Laterality: N/A;  . ESOPHAGOGASTRODUODENOSCOPY (EGD) WITH PROPOFOL N/A 02/07/2016   Procedure: ESOPHAGOGASTRODUODENOSCOPY (EGD) WITH PROPOFOL;  Surgeon: Mark Banister, MD;  Location: [facility];  Service: Endoscopy;  Laterality: N/A;  . ESOPHAGOGASTRODUODENOSCOPY (EGD) WITH PROPOFOL N/A  06/22/2017   Procedure: ESOPHAGOGASTRODUODENOSCOPY (EGD) WITH PROPOFOL;  Surgeon: Mark Bears, MD;  Location: [name];  Service: Gastroenterology;  Laterality: N/A;  . HERNIA REPAIR Right    inguinal  . KNEE ARTHROSCOPY     bilateral/  12/14  . TOTAL KNEE ARTHROPLASTY Right 05/02/2013   Procedure: RIGHT TOTAL KNEE ARTHROPLASTY;   Surgeon: Mark Pole, MD;  Location: [facility];  Service: Orthopedics;  Laterality: Right;     Home Medications:  Prior to Admission medications   Medication Sig Start Date End Date Taking? Authorizing Provider  albuterol (PROVENTIL HFA;VENTOLIN HFA) 108 (90 Base) MCG/ACT inhaler Inhale 2 puffs into the lungs every 6 (six) hours as needed for wheezing or shortness of breath. 09/16/17  Yes Mark Ruddy, MD  diphenhydrAMINE-zinc acetate (BENADRYL EXTRA STRENGTH) cream Apply 1 application topically 3 (three) times daily as needed for itching. 04/25/18  Yes Mark Moras, Mark [name]  folic acid (FOLVITE) 1 MG tablet TAKE 1 TABLET BY MOUTH EVERY DAY Patient taking differently: Take 1 mg by mouth daily.  03/19/18  Yes Mark Ruddy, MD  furosemide (LASIX) 20 MG tablet TAKE 1 TABLET BY MOUTH TWICE DAILY Patient taking differently: Take 20 mg by mouth 2 (two) times daily.  04/19/18  Yes Mark Ruddy, MD  guaiFENesin (MUCINEX) 600 MG 12 hr tablet Take 1 tablet (600 mg total) by mouth 2 (two) times daily as needed for cough or to loosen phlegm. 07/14/17  Yes Mark Fire, MD  lactulose (CHRONULAC) 10 GM/15ML solution Take 45 mLs (30 g total) by mouth 2 (two) times daily. 12/03/17  Yes Mark Ruddy, MD  nadolol (CORGARD) 20 MG tablet TAKE 1 TABLET BY MOUTH EVERY DAY Patient taking differently: Take 20 mg by mouth daily.  02/17/18  Yes Mark Ruddy, MD  pantoprazole (PROTONIX) 40 MG tablet TAKE 1 TABLET(40 MG) BY MOUTH DAILY Patient taking differently: Take 40 mg by mouth daily.  03/03/18  Yes Mark Ruddy, MD  silver sulfADIAZINE (SILVADENE) 1 % cream Apply 1 application topically daily. To  Burn Patient taking differently: Apply 1 application topically daily as needed. To  Burn 04/19/18  Yes [name], Mark Brooking, MD  spironolactone (ALDACTONE) 50 MG tablet Take 2 tablets (100 mg total) by mouth 2 (two) times daily. 04/19/18  Yes Mark Erp, PA  thiamine 100 MG tablet Take 1  tablet (100 mg total) by mouth daily. 12/04/17  Yes Mark Ruddy, MD  triamcinolone (KENALOG) 0.025 % ointment Apply 1 application topically 2 (two) times daily. To dry skin rash .Limit use [name] face to  10 days Patient taking differently: Apply 1 application topically 2 (two) times daily as needed (rash).  04/20/18  Yes [name], Mark Brooking, MD    Inpatient Medications: Scheduled Meds: . furosemide  20 mg Oral BID  . lactulose  30 g Oral BID  . nadolol  20 mg Oral Daily  . pantoprazole  40 mg Oral Daily  . spironolactone  100 mg Oral BID  . thiamine  100 mg Oral Daily   Continuous Infusions: .  ceFAZolin (ANCEF) IV 2 g (05/13/18 1315)   PRN Meds: ondansetron **OR** ondansetron (ZOFRAN) IV  Allergies:   No Known Allergies  Social History:   Social History   Socioeconomic History  . Marital status: Married    Spouse name: Not [name] file  . Number of children: 4  . Years of education: Not [name] file  . Highest education level: Not [name] file  Occupational History  . Occupation: Maintenance    Employer: [employer]  Social Needs  . Financial resource strain: Not [name] file  . Food insecurity:    Worry: Not [name] file    Inability: Not [name] file  . Transportation needs:    Medical: Not [name] file    Non-medical: Not [name] file  Tobacco Use  . Smoking status: Former Smoker    Packs/day: 0.50    Years: 10.00    Pack years: 5.00    Types: Cigarettes    Last attempt to quit: 01/05/2016    Years since quitting: 2.3  . Smokeless tobacco: Never Used  . Tobacco comment: 4 cigarettes a day  Substance and Sexual Activity  . Alcohol use: No    Comment: 40 oz per week as of 01-19-17  . Drug use: Yes    Types: Marijuana, Heroin    Comment: took about 1 month ago- as of 01-19-17  . Sexual activity: Not [name] file    Comment: now and then smokies marijuana  Lifestyle  . Physical activity:    Days per week: Not [name] file    Minutes per session: Not [name] file  . Stress: Not [name] file  Relationships  .  Social connections:    Talks [name] phone: Not [name] file    Gets together: Not [name] file    Attends religious service: Not [name] file    Active member of club or organization: Not [name] file    Attends meetings of clubs or organizations: Not [name] file    Relationship status: Not [name] file  . Intimate partner violence:    Fear of current or ex partner: Not [name] file    Emotionally abused: Not [name] file    Physically abused: Not [name] file    Forced sexual activity: Not [name] file  Other Topics Concern  . Not [name] file  Social History Narrative  . Not [name] file    Family History:    Family History  Problem Relation Age of Onset  . Hypertension Father   . Diabetes Father   . Dementia Mother   . Lupus Sister      ROS:  Please see the history of present illness.   All other ROS reviewed and negative.     Physical Exam/Data:   Vitals:   05/13/18 0915 05/13/18 1112 05/13/18 1120 05/13/18 1432  BP: (!) 176/74 127/79 (!) 149/75 (!) 149/76  Pulse:   (!) 55 (!) 49  Resp: 18 18 16    Temp: 98.1 F (36.7 C) 98.1 F (36.7 C)  98.1 F (36.7 C)  TempSrc: Oral Oral  Oral  SpO2: 100% 92% 100% 100%  Weight: 57.9 kg     Height: 5\' 8"  (1.727 m)       Intake/Output Summary (Last 24 hours) at 05/13/2018 1525 Last data filed at 05/13/2018 1104 Gross per 24 hour  Intake 700 ml  Output -  Net 700 ml   Last 3 Weights 05/13/2018 05/10/2018 04/25/2018  Weight (lbs) 127 lb 10.3 oz 127 lb 10.3 oz 140 lb  Weight (kg) 57.9 kg 57.9 kg 63.504 kg     Body mass index is 19.41 kg/m.  General:  Well nourished, well developed, in no acute distress HEENT: normal Lymph: no adenopathy Neck: no JVD Endocrine:  No thryomegaly Vascular: No carotid bruits; FA pulses 2+ bilaterally without bruits  Cardiac:  normal S1, S2; RRR; no murmur  Lungs:  clear to auscultation bilaterally, no  wheezing, rhonchi or rales  Abd: soft, nontender, no hepatomegaly  Ext: no edema Musculoskeletal:  No deformities, BUE and BLE strength normal and  equal Skin: warm and dry  Neuro:  CNs 2-12 intact, no focal abnormalities noted Psych:  Normal affect   EKG:  The EKG was personally reviewed and demonstrates: Sinus rhythm, 77 bpm, abnormal R wave progression  ?Anterior Q waves Telemetry:  Telemetry was personally reviewed and demonstrates: Sinus bradycardia in the 50s  Relevant CV Studies:  TEE 05/13/18: formal results pending  TTE 05/12/18 Study Conclusions - Left ventricle: The cavity size was normal. Wall thickness was   normal. Systolic function was normal. The estimated ejection   fraction was in the range of 60% to 65%. Wall motion was normal;   there were no regional wall motion abnormalities. Left   ventricular diastolic function parameters were normal for the   patient&'s age. - Aortic valve: Valve area (VTI): 2.64 cm^2. Valve area (Vmean):   2.43 cm^2. - Mitral valve: There was mild regurgitation. - Left atrium: The atrium was moderately dilated.  Impressions: - No obviouis vegetation   Suggest TEE if clinically indicated.   Laboratory Data:  Chemistry Recent Labs  Lab 05/11/18 0842 05/12/18 0418 05/13/18 0310  NA 144 142 140  K 3.3* 3.8 3.8  CL 120* 120* 115*  CO2 15* 14* 16*  GLUCOSE 81 142* 97  BUN 14 14 13   CREATININE 1.44* 1.33* 1.35*  CALCIUM 8.3* 8.4* 8.5*  GFRNONAA 51* 56* 55*  GFRAA 59* >60 >60  ANIONGAP 9 8 9     Recent Labs  Lab 05/10/18 0042 05/10/18 0452 05/13/18 0310  PROT 6.0* 6.4* 6.4*  ALBUMIN 2.1* 2.2* 2.6*  AST 52* 62* 31  ALT 23 28 14   ALKPHOS 87 91 61  BILITOT 2.9* 3.8* 1.1   Hematology Recent Labs  Lab 05/12/18 0418 05/13/18 0310 05/13/18 1139  WBC 5.7 8.1 8.6  RBC 2.11* 2.31* 2.61*  HGB 7.6* 8.1* 9.1*  HCT 22.6* 24.0* 28.6*  MCV 107.1* 103.9* 109.6*  MCH 36.0* 35.1* 34.9*  MCHC 33.6 33.8 31.8  RDW 15.8* 15.7* 15.9*  PLT 89* 95* 89*   Cardiac EnzymesNo results for input(s): TROPONINI in the last 168 hours. No results for input(s): TROPIPOC in the last 168  hours.  BNPNo results for input(s): BNP, PROBNP in the last 168 hours.  DDimer No results for input(s): DDIMER in the last 168 hours.  Radiology/Studies:  Ct Head Wo Contrast  Result Date: 05/10/2018 CLINICAL DATA:  Altered mental status/confusion EXAM: CT HEAD WITHOUT CONTRAST TECHNIQUE: Contiguous axial images were obtained from the base of the skull through the vertex without intravenous contrast. COMPARISON:  MR brain dated 11/02/2009 FINDINGS: Brain: No evidence of acute infarction, hemorrhage, hydrocephalus, extra-axial collection or mass lesion/mass effect. Subcortical white matter and periventricular small vessel ischemic changes. Vascular: No hyperdense vessel or unexpected calcification. Skull: Normal. Negative for fracture or focal lesion. Sinuses/Orbits: The visualized paranasal sinuses are essentially clear. The mastoid air cells are unopacified. Other: None. IMPRESSION: No evidence of acute intracranial abnormality. Small vessel ischemic changes. Electronically Signed   By: Julian Hy M.D.   [name]: 05/10/2018 02:27   Dg Chest Port 1 View  Result Date: 05/10/2018 CLINICAL DATA:  Mental status changes. EXAM: PORTABLE CHEST 1 VIEW COMPARISON:  07/10/2017 FINDINGS: Normal heart size and pulmonary vascularity. No focal airspace disease or consolidation in the lungs. No blunting of costophrenic angles. No pneumothorax. Mediastinal contours appear intact. Degenerative changes in the  shoulders. Old left rib fractures. IMPRESSION: No active disease. Electronically Signed   By: Lucienne Capers M.D.   [name]: 05/10/2018 01:01    Assessment and Plan:   1. Mitral regurgitation -History of mild MR.  TTE done yesterday showed mild MR, however today's TEE appears to show moderate to severe MR, no vegetation noted. -Patient has no anginal or heart failure symptoms -Awaiting formal TEE report.  -Will have Dr. Sallyanne Kuster review.   2.  Hepatic encephalopathy in a patient with alcoholic cirrhosis of  the liver and portal hypertension with ascites -Mental status improving with lactulose -Patient continues [name] Spironolactone and Lasix, nadolol  3.  MSSA bacteremia -Unclear source, likely skin. Pt denies IV drug use. -Management per primary team and ID, [name] IV Ancef -TEE shows no vegetation  4. CKD stage III -SCr 1.58->1.35  5. Chronic macrocytic anemia -Most likely related to alcohol abuse per primary team. -Hemoglobin 7.6->9.1  6. Chronic thrombocytopenia -Most likely related to alcohol abuse. -Platelets 75->89  7. Substance abuse -UDS positive for narcotics and benzodiazepines -He admits to occasionally smoking heroin but denies any IV drug use  For questions or updates, please contact [place] Please consult [email or web address] for contact info under     Signed, Daune Perch, NP  05/13/2018 3:25 PM   I have seen and examined the patient along with Daune Perch, NP.  I have reviewed the chart, notes and new data.  I agree with PA/NP's note.  Key new complaints: he just wants to go home, no complaints Key examination changes: I cannot hear an apical holosystolic murmur. Key new findings / data: reviewed the TTE and TEE. In my opinion the MR is moderate. Cultures positive for strep pyogenes x 2 and staph non-aureus x 1, neither one a typical endocarditis organism in a patient with native valves.  PLAN: No evidence for endocarditis. No further evaluation or treatment is planned for the MR. Can follow up as an outpatient.  Sanda Klein, MD, [place] [phone] 05/13/2018, 5:47 PM

## 2018-05-13 NOTE — Progress Notes (Signed)
  Echocardiogram TEE has been performed.  Jannett Celestine 05/13/2018, 11:20 AM

## 2018-05-13 NOTE — Transfer of Care (Signed)
Immediate Anesthesia Transfer of Care Note  Patient: Mark [name]  Procedure(s) Performed: TRANSESOPHAGEAL ECHOCARDIOGRAM (TEE) (N/A ) BUBBLE STUDY  Patient Location: Endoscopy Unit  Anesthesia Type:MAC  Level of Consciousness: awake, alert  and oriented  Airway & Oxygen Therapy: Patient Spontanous Breathing  Post-op Assessment: Report given to RN, Post -op Vital signs reviewed and stable and Patient moving all extremities X 4  Post vital signs: Reviewed and stable  Last Vitals:  Vitals Value Taken Time  BP    Temp    Pulse    Resp    SpO2      Last Pain:  Vitals:   05/13/18 0915  TempSrc: Oral  PainSc: 0-No pain         Complications: No apparent anesthesia complications

## 2018-05-13 NOTE — Anesthesia Preprocedure Evaluation (Addendum)
Anesthesia Evaluation  Patient identified by MRN, date of birth, ID band Patient awake    Reviewed: Allergy & Precautions, NPO status , Patient's Chart, lab work & pertinent test results  Airway Mallampati: IV  TM Distance: >3 FB Neck ROM: Full  Mouth opening: Limited Mouth Opening  Dental no notable dental hx. (+) Edentulous Upper, Edentulous Lower   Pulmonary neg pulmonary ROS, former smoker,    Pulmonary exam normal breath sounds clear to auscultation       Cardiovascular hypertension, Pt. on medications and Pt. on home beta blockers + CAD and + Past MI (2012)  Normal cardiovascular exam Rhythm:Regular Rate:Normal  05/12/18 - Left ventricle: The cavity size was normal. Wall thickness was normal. Systolic function was normal. The estimated ejection fraction was in the range of 60% to 65%. Wall motion was normal; there were no regional wall motion abnormalities. - Mitral valve: There was mild regurgitation. - Left atrium: The atrium was moderately dilated. - No obviouis vegetation    Neuro/Psych CVA (2012), No Residual Symptoms negative psych ROS   GI/Hepatic PUD, GERD  Medicated,(+) Cirrhosis   ascites  substance abuse  alcohol use, cocaine use and marijuana use,   Endo/Other  negative endocrine ROS  Renal/GU negative Renal ROS  negative genitourinary   Musculoskeletal negative musculoskeletal ROS (+)   Abdominal   Peds  Hematology  (+) Blood dyscrasia (Hgb 8.1, platelets 95), anemia ,   Anesthesia Other Findings TEE for MSSA bacteremia  Reproductive/Obstetrics                            Anesthesia Physical Anesthesia Plan  ASA: III  Anesthesia Plan: MAC   Post-op Pain Management:    Induction: Intravenous  PONV Risk Score and Plan: 1 and Propofol infusion and Treatment may vary due to age or medical condition  Airway Management Planned: Natural Airway and Nasal  Cannula  Additional Equipment:   Intra-op Plan:   Post-operative Plan:   Informed Consent: I have reviewed the patients History and Physical, chart, labs and discussed the procedure including the risks, benefits and alternatives for the proposed anesthesia with the patient or authorized representative who has indicated his/her understanding and acceptance.     Dental advisory given  Plan Discussed with: CRNA  Anesthesia Plan Comments:         Anesthesia Quick Evaluation

## 2018-05-13 NOTE — CV Procedure (Signed)
INDICATIONS: infective endocarditis  PROCEDURE:   Informed consent was obtained prior to the procedure. The risks, benefits and alternatives for the procedure were discussed and the patient comprehended these risks.  Risks include, but are not limited to, cough, sore throat, vomiting, nausea, somnolence, esophageal and stomach trauma or perforation, bleeding, low blood pressure, aspiration, pneumonia, infection, trauma to the teeth and death.    After a procedural time-out, the oropharynx was anesthetized with 20% benzocaine spray.   During this procedure the patient was administered propofol per anesthesia to achieve and maintain moderate conscious sedation.  The patient's heart rate, blood pressure, and oxygen saturationweare monitored continuously during the procedure. The period of conscious sedation was 30 minutes, of which I was present face-to-face 100% of this time.  The transesophageal probe was inserted in the esophagus and stomach without difficulty and multiple views were obtained.  The patient was kept under observation until the patient left the procedure room.  The patient left the procedure room in stable condition.   Agitated microbubble saline contrast was administered.  COMPLICATIONS:    There were no immediate complications.  FINDINGS:  No evidence of endocarditis. Likely moderate-severe mitral valve regurgitation. Mild aortic atherosclerosis.   RECOMMENDATIONS:    Can return to hospital room.  Time Spent Directly with the Patient:  45 minutes   Gayatri A Margaretann Loveless 05/13/2018, 11:09 AM

## 2018-05-13 NOTE — Interval H&P Note (Signed)
History and Physical Interval Note:  05/13/2018 11:22 AM  Mark [name]  has presented today for surgery, with the diagnosis of bacteremia  The various methods of treatment have been discussed with the patient and family. After consideration of risks, benefits and other options for treatment, the patient has consented to  Procedure(s): TRANSESOPHAGEAL ECHOCARDIOGRAM (TEE) (N/A) BUBBLE STUDY as a surgical intervention .  The patient's history has been reviewed, patient examined, no change in status, stable for surgery.  I have reviewed the patient's chart and labs.  Questions were answered to the patient's satisfaction.     [name]

## 2018-05-13 NOTE — Care Management Note (Addendum)
Case Management Note  Patient Details  Name: [name] MRN: [id number] Date of Birth: April 19, 1956  Subjective/Objective:      Admitted with encephalopathy /MSSA Bacteremia. Hx of of alcoholic cirrhosis/ascites, pancytopenia, HTN, CVA, CAD, CKD II-III, substance use (Alcohol, Heroin, and Marijuana) From home with wife. Pt states PTA independent with ADL's, no DME usage.                   [name] (Daughter) Cordaryl Decelles ([phone]          PCP: Grier Mitts   Action/Plan: ID following, repeat bld cultures pending, s/p TEE 1/16(negative for endocarditis) .Marland KitchenMarland KitchenNCM  monitoring for TOC  Needs  [name] recommendations: Home health PT;Supervision/Assistance - 24 hour. Pt states once d/c family will provide 24 hr supervision/assist. needed.  Expected Discharge Date:                  Expected Discharge Plan:     In-House Referral:     Discharge planning Services     Post Acute Care Choice:    Choice offered to:   patient  DME Arranged:   N/A DME Agency:   N/A  HH Arranged:   PT HH Agency:   [place]  Status of Service:  In process, will continue to follow  If discussed at Long Length of Stay Meetings, dates discussed:    Additional Comments:  Sharin Mons, RN 05/13/2018, 2:34 PM

## 2018-05-13 NOTE — Plan of Care (Signed)
  Problem: Education: Goal: Knowledge of General Education information will improve Description: Including pain rating scale, medication(s)/side effects and non-pharmacologic comfort measures Outcome: Progressing   Problem: Health Behavior/Discharge Planning: Goal: Ability to manage health-related needs will improve Outcome: Progressing   Problem: Clinical Measurements: Goal: Will remain free from infection Outcome: Progressing   Problem: Nutrition: Goal: Adequate nutrition will be maintained Outcome: Progressing   Problem: Coping: Goal: Level of anxiety will decrease Outcome: Progressing   Problem: Elimination: Goal: Will not experience complications related to bowel motility Outcome: Progressing   

## 2018-05-13 NOTE — Progress Notes (Signed)
Pharmacy Antibiotic Note  Mark [name] is a 63 y.o. male admitted on 05/10/2018 with AMS.  Now with MSSA bacteremia and Pharmacy was consulted on 05/10/18 for Ancef dosing.  SCr 1.55>>1.33>1.35, CrCl 45 ml/min, afebrile, WBC 2.8>5.7>8.6, LA 2.9. TEE 1/16  No evidence of endocarditis per Dr. Margaretann Loveless.   Plan: Continue Ancef 2gm IV Q8H Monitor renal fxn, clinical progress F/u ID consult, repeat BCx  Height: 5\' 8"  (172.7 cm) Weight: 127 lb 10.3 oz (57.9 kg) IBW/kg (Calculated) : 68.4  Temp (24hrs), Avg:98 F (36.7 C), Min:97.9 F (36.6 C), Max:98.1 F (36.7 C)  Recent Labs  Lab 05/10/18 0042 05/10/18 0452 05/11/18 0842 05/12/18 0418 05/13/18 0310 05/13/18 1139  WBC 2.8*  --  5.5 5.7 8.1 8.6  CREATININE 1.58* 1.55* 1.44* 1.33* 1.35*  --   LATICACIDVEN  --  2.9*  --   --   --   --     Estimated Creatinine Clearance: 45.3 mL/min (A) (by C-G formula based on SCr of 1.35 mg/dL (H)).    No Known Allergies   Ancef 1/13 >>  1/13 MRSA PCR - negative 1/13 BCx - 3 of 4 GPC (BCID MSSA) 1/14 Bcx - ngtd   Thank you for allowing pharmacy to be part of this patients care team. Nicole Cella, [place] Pharmacist [phone] Please check AMION for all [place] phone numbers After 10:00 PM, call [place] 05/13/2018, 2:15 PM

## 2018-05-13 NOTE — Anesthesia Procedure Notes (Signed)
Procedure Name: MAC Date/Time: 05/13/2018 10:30 AM Performed by: Harden Mo, CRNA Pre-anesthesia Checklist: Patient identified, Emergency Drugs available, Suction available and Patient being monitored Patient Re-evaluated:Patient Re-evaluated prior to induction Oxygen Delivery Method: Nasal cannula Preoxygenation: Pre-oxygenation with 100% oxygen Induction Type: IV induction Placement Confirmation: positive ETCO2 and breath sounds checked- equal and bilateral Dental Injury: Teeth and Oropharynx as per pre-operative assessment

## 2018-05-14 ENCOUNTER — Inpatient Hospital Stay: Payer: Self-pay

## 2018-05-14 DIAGNOSIS — K7030 Alcoholic cirrhosis of liver without ascites: Secondary | ICD-10-CM

## 2018-05-14 LAB — COMPREHENSIVE METABOLIC PANEL
ALT: 13 U/L (ref 0–44)
AST: 39 U/L (ref 15–41)
Albumin: 2.4 g/dL — ABNORMAL LOW (ref 3.5–5.0)
Alkaline Phosphatase: 74 U/L (ref 38–126)
Anion gap: 9 (ref 5–15)
BUN: 14 mg/dL (ref 8–23)
CO2: 18 mmol/L — ABNORMAL LOW (ref 22–32)
Calcium: 8.4 mg/dL — ABNORMAL LOW (ref 8.9–10.3)
Chloride: 113 mmol/L — ABNORMAL HIGH (ref 98–111)
Creatinine, Ser: 1.15 mg/dL (ref 0.61–1.24)
Glucose, Bld: 98 mg/dL (ref 70–99)
Potassium: 3.8 mmol/L (ref 3.5–5.1)
Sodium: 140 mmol/L (ref 135–145)
Total Bilirubin: 1 mg/dL (ref 0.3–1.2)
Total Protein: 6 g/dL — ABNORMAL LOW (ref 6.5–8.1)

## 2018-05-14 LAB — HEPATITIS B SURFACE ANTIGEN: Hepatitis B Surface Ag: NEGATIVE

## 2018-05-14 LAB — MAGNESIUM: Magnesium: 1.7 mg/dL (ref 1.7–2.4)

## 2018-05-14 LAB — HEPATITIS B SURFACE ANTIBODY, QUANTITATIVE: Hep B S AB Quant (Post): <3.1 m[IU]/mL — ABNORMAL LOW (ref >9.9)

## 2018-05-14 LAB — HCV INTERPRETATION

## 2018-05-14 LAB — HEPATITIS A ANTIBODY, TOTAL: Hep A Total Ab: POSITIVE — AB

## 2018-05-14 LAB — HCV AB W REFLEX TO QUANT PCR: HCV Ab: <0.1 s/co ratio (ref 0.0–0.9)

## 2018-05-14 MED ORDER — CEFAZOLIN IV (FOR PTA / DISCHARGE USE ONLY): 2.0000 g | Freq: Three times a day (TID) | INTRAVENOUS | 0 refills | Status: DC

## 2018-05-14 MED ORDER — SODIUM CHLORIDE 0.9% FLUSH
10.0000 mL | Freq: Two times a day (BID) | INTRAVENOUS | Status: DC
  Administered 2018-05-14: 20 mL

## 2018-05-14 MED ORDER — SODIUM CHLORIDE 0.9% FLUSH: 10.0000 mL | INTRAVENOUS | Status: DC | PRN

## 2018-05-14 MED ORDER — SILVER SULFADIAZINE 1 % EX CREA: 1.0000 "application " | TOPICAL_CREAM | Freq: Every day | CUTANEOUS | Status: DC | PRN

## 2018-05-14 MED ORDER — TRIAMCINOLONE ACETONIDE 0.025 % EX OINT: 1.0000 "application " | TOPICAL_OINTMENT | Freq: Two times a day (BID) | CUTANEOUS | Status: DC | PRN

## 2018-05-14 NOTE — Progress Notes (Signed)
Pt d/c home per MD order, pt verbalized understanding of d/c. Pt VSS, all questions answered.

## 2018-05-14 NOTE — Progress Notes (Signed)
[facility] for Infectious [facility]   Reason for visit: Follow up on bacteremia  Interval History: Late entry - TEE negative for vegetation.  New finding of moderate to severe mitral regurgitation.  Afebrile, WBC wnl.  Wants to go home.   Physical Exam: Constitutional:  Vitals:   05/13/18 2146 05/14/18 0450  BP: 140/72 (!) 124/58  Pulse: (!) 56 68  Resp: 18 18  Temp: 97.8 F (36.6 C) 98.4 F (36.9 C)  SpO2: 100% 100%   patient appears in NAD Eyes: anicteric HENT: no thrush Respiratory: Normal respiratory effort; CTA B Cardiovascular: RRR no murmur appreciated GI: soft, nt, nd  Review of Systems: Constitutional: negative for fevers, chills and anorexia Gastrointestinal: negative for nausea and diarrhea Integument/breast: negative for rash  Lab Results  Component Value Date   WBC 8.6 05/13/2018   HGB 9.1 (L) 05/13/2018   HCT 28.6 (L) 05/13/2018   MCV 109.6 (H) 05/13/2018   PLT 89 (L) 05/13/2018    Lab Results  Component Value Date   CREATININE 1.15 05/14/2018   BUN 14 05/14/2018   NA 140 05/14/2018   K 3.8 05/14/2018   CL 113 (H) 05/14/2018   CO2 18 (L) 05/14/2018    Lab Results  Component Value Date   ALT 13 05/14/2018   AST 39 05/14/2018   ALKPHOS 74 05/14/2018     Microbiology: Recent Results (from the past 240 hour(s))  Culture, blood (Routine X 2) w Reflex to ID Panel     Status: Abnormal   Collection Time: 05/10/18  4:52 AM  Result Value Ref Range Status   Specimen Description BLOOD RIGHT ANTECUBITAL  Final   Special Requests   Final    BOTTLES DRAWN AEROBIC AND ANAEROBIC Blood Culture adequate volume   Culture  Setup Time   Final    IN BOTH AEROBIC AND ANAEROBIC BOTTLES GRAM POSITIVE COCCI CRITICAL RESULT CALLED TO, READ BACK BY AND VERIFIED WITHRito Ehrlich PHARMD 05/10/18 2054 JDW Performed at [facility], [address] [place],  [address]    Culture STAPHYLOCOCCUS AUREUS (A)  Final   Report Status 05/12/2018 FINAL   Final   Organism ID, Bacteria STAPHYLOCOCCUS AUREUS  Final      Susceptibility   Staphylococcus aureus - MIC*    CIPROFLOXACIN <=0.5 SENSITIVE Sensitive     ERYTHROMYCIN <=0.25 SENSITIVE Sensitive     GENTAMICIN <=0.5 SENSITIVE Sensitive     OXACILLIN 0.5 SENSITIVE Sensitive     TETRACYCLINE <=1 SENSITIVE Sensitive     VANCOMYCIN <=0.5 SENSITIVE Sensitive     TRIMETH/SULFA <=10 SENSITIVE Sensitive     CLINDAMYCIN <=0.25 SENSITIVE Sensitive     RIFAMPIN <=0.5 SENSITIVE Sensitive     Inducible Clindamycin NEGATIVE Sensitive     * STAPHYLOCOCCUS AUREUS  Culture, blood (Routine X 2) w Reflex to ID Panel     Status: Abnormal   Collection Time: 05/10/18  4:52 AM  Result Value Ref Range Status   Specimen Description BLOOD RIGHT ANTECUBITAL  Final   Special Requests   Final    BOTTLES DRAWN AEROBIC AND ANAEROBIC Blood Culture adequate volume   Culture  Setup Time   Final    IN BOTH AEROBIC AND ANAEROBIC BOTTLES GRAM POSITIVE COCCI CRITICAL VALUE NOTED.  VALUE IS CONSISTENT WITH PREVIOUSLY REPORTED AND CALLED VALUE.    Culture (A)  Final    STAPHYLOCOCCUS AUREUS SUSCEPTIBILITIES PERFORMED ON PREVIOUS CULTURE WITHIN THE LAST 5 DAYS. Performed at [facility],  [address] [place], [place] [address]    Report Status 05/12/2018 FINAL  Final  Blood Culture ID Panel (Reflexed)     Status: Abnormal   Collection Time: 05/10/18  4:52 AM  Result Value Ref Range Status   Enterococcus species NOT DETECTED NOT DETECTED Final   Listeria monocytogenes NOT DETECTED NOT DETECTED Final   Staphylococcus species DETECTED (A) NOT DETECTED Final    Comment: CRITICAL RESULT CALLED TO, READ BACK BY AND VERIFIED WITH: T PANGDANG PHARMD 05/10/18 2054 JDW    Staphylococcus aureus (BCID) DETECTED (A) NOT DETECTED Final    Comment: Methicillin (oxacillin) susceptible Staphylococcus aureus (MSSA). Preferred therapy is anti staphylococcal beta lactam antibiotic (Cefazolin or Nafcillin), unless clinically  contraindicated. CRITICAL RESULT CALLED TO, READ BACK BY AND VERIFIED WITH: T PANGDANG PHARMD 05/10/18 JDW    Methicillin resistance NOT DETECTED NOT DETECTED Final   Streptococcus species NOT DETECTED NOT DETECTED Final   Streptococcus agalactiae NOT DETECTED NOT DETECTED Final   Streptococcus pneumoniae NOT DETECTED NOT DETECTED Final   Streptococcus pyogenes NOT DETECTED NOT DETECTED Final   Acinetobacter baumannii NOT DETECTED NOT DETECTED Final   Enterobacteriaceae species NOT DETECTED NOT DETECTED Final   Enterobacter cloacae complex NOT DETECTED NOT DETECTED Final   Escherichia coli NOT DETECTED NOT DETECTED Final   Klebsiella oxytoca NOT DETECTED NOT DETECTED Final   Klebsiella pneumoniae NOT DETECTED NOT DETECTED Final   Proteus species NOT DETECTED NOT DETECTED Final   Serratia marcescens NOT DETECTED NOT DETECTED Final   Haemophilus influenzae NOT DETECTED NOT DETECTED Final   Neisseria meningitidis NOT DETECTED NOT DETECTED Final   Pseudomonas aeruginosa NOT DETECTED NOT DETECTED Final   Candida albicans NOT DETECTED NOT DETECTED Final   Candida glabrata NOT DETECTED NOT DETECTED Final   Candida krusei NOT DETECTED NOT DETECTED Final   Candida parapsilosis NOT DETECTED NOT DETECTED Final   Candida tropicalis NOT DETECTED NOT DETECTED Final    Comment: Performed at [facility], [place] [address] [place], [place] [address]  MRSA PCR Screening     Status: None   Collection Time: 05/10/18 10:21 AM  Result Value Ref Range Status   MRSA by PCR NEGATIVE NEGATIVE Final    Comment:        The GeneXpert MRSA Assay (FDA approved for NASAL specimens only), is one component of a comprehensive MRSA colonization surveillance program. It is not intended to diagnose MRSA infection nor to guide or monitor treatment for MRSA infections. Performed at [facility], [place] [address] [place], [place] [address]   Culture, blood (routine x 2)     Status: None (Preliminary  result)   Collection Time: 05/11/18  3:13 PM  Result Value Ref Range Status   Specimen Description BLOOD LEFT HAND  Final   Special Requests   Final    BOTTLES DRAWN AEROBIC ONLY Blood Culture adequate volume   Culture   Final    NO GROWTH 2 DAYS Performed at [facility], [place] [address], [place], [place] [address]    Report Status PENDING  Incomplete  Culture, blood (routine x 2)     Status: None (Preliminary result)   Collection Time: 05/11/18  4:00 PM  Result Value Ref Range Status   Specimen Description BLOOD LEFT HAND  Final   Special Requests   Final    BOTTLES DRAWN AEROBIC ONLY Blood Culture adequate volume   Culture   Final    NO GROWTH 2 DAYS Performed  at [facility], [place] [address], [place], [place] [address]    Report Status PENDING  Incomplete    Impression/Plan:  1. Bacteremia - MSSA and on appropriate therapy.  Repeat blood cultures remain negative.   TEE negative so will treat for 14 days with IV cefazolin through January 27th and stop. Home health can pull picc line at the end of treatment.   OPAT  2.  Access - ok for picc line and I have ordered for today, 1/17.    3.  Medication monitoring - labs per home health protocol.    4. Mitral regurgitation - new.  Will follow up with cardiology.    Follow up arranged 1/27.

## 2018-05-14 NOTE — Progress Notes (Signed)
PHARMACY CONSULT NOTE FOR:  OUTPATIENT  PARENTERAL ANTIBIOTIC THERAPY (OPAT)  Indication: MSSA bacteremia Regimen: Cefazolin 2 gm every 8 hours  End date: 05/24/2018  IV antibiotic discharge orders are pended. To discharging provider:  please sign these orders via discharge navigator,  Select New Orders & click on the button choice - Manage This Unsigned Work.     Thank you for allowing pharmacy to be a part of this patient's care.  Jimmy Footman, PharmD, BCPS, [place] [facility] Clinical Pharmacist Phone: [phone] 05/14/2018, 8:22 AM

## 2018-05-14 NOTE — Anesthesia Postprocedure Evaluation (Signed)
Anesthesia Post Note  Patient: Mark [name]  Procedure(s) Performed: TRANSESOPHAGEAL ECHOCARDIOGRAM (TEE) (N/A ) BUBBLE STUDY     Patient location during evaluation: PACU Anesthesia Type: MAC Level of consciousness: awake and alert Pain management: pain level controlled Vital Signs Assessment: post-procedure vital signs reviewed and stable Respiratory status: spontaneous breathing, nonlabored ventilation, respiratory function stable and patient connected to nasal cannula oxygen Cardiovascular status: stable and blood pressure returned to baseline Postop Assessment: no apparent nausea or vomiting Anesthetic complications: no    Last Vitals:  Vitals:   05/13/18 2146 05/14/18 0450  BP: 140/72 (!) 124/58  Pulse: (!) 56 68  Resp: 18 18  Temp: 36.6 C 36.9 C  SpO2: 100% 100%    Last Pain:  Vitals:   05/14/18 0450  TempSrc: Oral  PainSc:                  Konor Noren L Hildegard Hlavac

## 2018-05-14 NOTE — Progress Notes (Signed)
Peripherally Inserted Central Catheter/Midline Placement  The IV Nurse has discussed with the patient and/or persons authorized to consent for the patient, the purpose of this procedure and the potential benefits and risks involved with this procedure.  The benefits include less needle sticks, lab draws from the catheter, and the patient may be discharged home with the catheter. Risks include, but not limited to, infection, bleeding, blood clot (thrombus formation), and puncture of an artery; nerve damage and irregular heartbeat and possibility to perform a PICC exchange if needed/ordered by physician.  Alternatives to this procedure were also discussed.  B[employer] Power PICC patient education guide, fact sheet on infection prevention and patient information card has been provided to patient /or left at bedside.    PICC/Midline Placement Documentation  PICC Single Lumen 05/14/18 PICC Right Brachial 38 cm 0 cm (Active)  Indication for Insertion or Continuance of Line Home intravenous therapies (PICC only) 05/14/2018  9:00 AM  Exposed Catheter (cm) 0 cm 05/14/2018  9:00 AM  Site Assessment Clean;Dry;Intact 05/14/2018  9:00 AM  Line Status Flushed;Blood return noted 05/14/2018  9:00 AM  Dressing Type Transparent 05/14/2018  9:00 AM  Dressing Status Clean;Dry;Intact;Antimicrobial disc in place 05/14/2018  9:00 AM  Dressing Change Due 05/21/18 05/14/2018  9:00 AM       Jule Economy Horton 05/14/2018, 9:31 AM

## 2018-05-14 NOTE — Discharge Summary (Signed)
Physician Discharge Summary  Mark [name] KYH:[id number] DOB: 1955/09/29 DOA: 05/10/2018  PCP: [name], MD  Admit date: 05/10/2018 Discharge date: 05/14/2018  Admitted From: Home Disposition:  Home  Recommendations for Outpatient Follow-up:  1. Follow up with PCP in 1 week with repeat CBC/CMP 2. Follow-up with ID as an outpatient 3. Follow-up with cardiology as an outpatient 4. Comply with medications and follow-up 5. Follow-up in the ED if symptoms worsen or new appear   Home Health: Yes, home health RN Equipment/Devices: PICC line, being placed on 05/14/2018  Discharge Condition: Stable CODE STATUS: Full Diet recommendation: Heart Healthy /fluid restriction of up to 1500 cc a day  Brief/Interim Summary:63 year old male with history of alcoholic liver cirrhosis with ascites, pancytopenia, hypertension, stroke, CAD, polysubstance abuse (alcohol, heroin, marijuana), CKD stage III presented with altered mental status.  He was found to be combative and needed Haldol and Versed by EMS.  Ammonia level was 203.  Blood cultures was positive for MSSA and he was started on Ancef.  ID was consulted.  2D echo and TEE were negative for vegetations.  PICC line is being placed today.  His mental status has much improved.  He will be discharged on IV Ancef.  Discharge Diagnoses:  Principal Problem:   MSSA bacteremia Active Problems:   Cirrhosis, alcoholic ([name])   Substance abuse ([place])   CKD (chronic kidney disease) stage 3, GFR 30-59 ml/min ([name])   Hypotension   Pancytopenia ([name])   CAD (coronary artery disease)   Hepatic encephalopathy ([name])   Alcohol abuse   GERD (gastroesophageal reflux disease)   Edentulous  Hepatic encephalopathy in a patient with alcoholic cirrhosis of liver with portal hypertension and ascites -Mental status has much improved.  Continue oral lactulose. Patient is intermittently refusing to take lactulose; counseled him about the importance of  compliance.   -Continue spironolactone and lasix.  Outpatient follow-up with GI.  Continue nadolol. -Counseled about alcohol abstinence.  MSSA bacteremia -Most likely skin source.  No signs of peritonitis or urinary infection  -2D echo and TEE were negative for vegetations.  Currently on IV Ancef.  PICC line being placed today.  ID recommends Ancef till 05/24/2018.  Outpatient follow-up.  Repeat cultures negative so far.  Chronic kidney disease stage III -Stable.  Creatinine ranged from 0.9-1.4 at baseline  Hypomagnesemia -Replaced.  Improved  Chronic macrocytic anemia -Most likely from alcohol abuse.  No signs of bleeding.    Hemoglobin stable.  Outpatient follow-up  Substance abuse -UDS positive for narcotics and benzodiazepines.  Counseled about abstinence.  Chronic thrombocytopenia -Most likely from alcohol abuse.  No signs of bleeding.    Outpatient follow-up  Moderate to severe MR -As seen on 2D echo.  Cardiology evaluation appreciated.  No further work-up needed.  Outpatient follow-up.  GERD -Continue PPI  Discharge Instructions  Discharge Instructions    Ambulatory referral to Cardiology   Complete by:  As directed    Follow up for severe MR   Ambulatory referral to Infectious Disease   Complete by:  As directed    MSSA bacteremia followup   Call MD for:  difficulty breathing, headache or visual disturbances   Complete by:  As directed    Call MD for:  extreme fatigue   Complete by:  As directed    Call MD for:  hives   Complete by:  As directed    Call MD for:  persistant dizziness or light-headedness   Complete by:  As directed  Call MD for:  persistant nausea and vomiting   Complete by:  As directed    Call MD for:  severe uncontrolled pain   Complete by:  As directed    Call MD for:  temperature >100.4   Complete by:  As directed    Diet - low sodium heart healthy   Complete by:  As directed    Home infusion instructions [facility] May  follow [place] Dosing Protocol; May administer Cathflo as needed to maintain patency of vascular access device.; Flushing of vascular access device: per [facility] Protocol: 0.9% NaCl pre/post medica...   Complete by:  As directed    Instructions:  May follow [place] Dosing Protocol   Instructions:  May administer Cathflo as needed to maintain patency of vascular access device.   Instructions:  Flushing of vascular access device: per [facility] Protocol: 0.9% NaCl pre/post medication administration and prn patency; Heparin 100 u/ml, 56m for implanted ports and Heparin 10u/ml, 565mfor all other central venous catheters.   Instructions:  May follow [facility] Anaphylaxis Protocol for First Dose Administration in the home: 0.9% NaCl at 25-50 ml/hr to maintain IV access for protocol meds. Epinephrine 0.3 ml IV/IM PRN and Benadryl 25-50 IV/IM PRN s/s of anaphylaxis.   Instructions:  AdLinglestownnfusion Coordinator (RN) to assist per patient IV care needs in the home PRN.   Increase activity slowly   Complete by:  As directed      Allergies as of 05/14/2018   No Known Allergies     Medication List    TAKE these medications   albuterol 108 (90 Base) MCG/ACT inhaler Commonly known as:  PROVENTIL HFA;VENTOLIN HFA Inhale 2 puffs into the lungs every 6 (six) hours as needed for wheezing or shortness of breath.   ceFAZolin  IVPB Commonly known as:  ANCEF Inject 2 g into the vein every 8 (eight) hours for 10 days. Indication:  MSSA bacteremia Last Day of Therapy:  05/24/2018 Labs - Once weekly:  CBC/D and BMP, Labs - Every other week:  ESR and CRP   diphenhydrAMINE-zinc acetate cream Commonly known as:  BENADRYL EXTRA STRENGTH Apply 1 application topically 3 (three) times daily as needed for itching.   folic acid 1 MG tablet Commonly known as:  FOLVITE TAKE 1 TABLET BY MOUTH EVERY DAY What changed:    how much to take  how to take this  when to take this  additional instructions    furosemide 20 MG tablet Commonly known as:  LASIX TAKE 1 TABLET BY MOUTH TWICE DAILY What changed:  when to take this   guaiFENesin 600 MG 12 hr tablet Commonly known as:  MUCINEX Take 1 tablet (600 mg total) by mouth 2 (two) times daily as needed for cough or to loosen phlegm.   lactulose 10 GM/15ML solution Commonly known as:  CHRONULAC Take 45 mLs (30 g total) by mouth 2 (two) times daily.   nadolol 20 MG tablet Commonly known as:  CORGARD TAKE 1 TABLET BY MOUTH EVERY DAY   pantoprazole 40 MG tablet Commonly known as:  PROTONIX TAKE 1 TABLET(40 MG) BY MOUTH DAILY What changed:  See the new instructions.   silver sulfADIAZINE 1 % cream Commonly known as:  SILVADENE Apply 1 application topically daily as needed. To  Burn   spironolactone 50 MG tablet Commonly known as:  ALDACTONE Take 2 tablets (100 mg total) by mouth 2 (two) times daily.   thiamine 100 MG tablet Take  1 tablet (100 mg total) by mouth daily.   triamcinolone 0.025 % ointment Commonly known as:  KENALOG Apply 1 application topically 2 (two) times daily as needed (rash).            Home Infusion Instuctions  (From admission, onward)         Start     Ordered   05/14/18 0000  Home infusion instructions [facility] May follow [place] Dosing Protocol; May administer Cathflo as needed to maintain patency of vascular access device.; Flushing of vascular access device: per [facility] Protocol: 0.9% NaCl pre/post medica...    Question Answer Comment  Instructions May follow [place] Dosing Protocol   Instructions May administer Cathflo as needed to maintain patency of vascular access device.   Instructions Flushing of vascular access device: per [facility] Protocol: 0.9% NaCl pre/post medication administration and prn patency; Heparin 100 u/ml, 78m for implanted ports and Heparin 10u/ml, 538mfor all other central venous catheters.   Instructions May follow [facility] Anaphylaxis Protocol for First Dose  Administration in the home: 0.9% NaCl at 25-50 ml/hr to maintain IV access for protocol meds. Epinephrine 0.3 ml IV/IM PRN and Benadryl 25-50 IV/IM PRN s/s of anaphylaxis.   Instructions [facility] Infusion Coordinator (RN) to assist per patient IV care needs in the home PRN.      05/14/18 0905          Follow-up Information    [facility]   Specialty:  Home Health Services Why:  homehealth services arranged Contact information: [phone]        BaBillie RuddyMD. Schedule an appointment as soon as possible for a visit in 1 week(s).   Specialty:  Family Medicine Why:  with cbc/cmp Contact information: [phone]        NiJosue HectorMD .   Specialty:  Cardiology Contact information: [phone]. [address]u[phone]        CoThayer HeadingsMD. Schedule an appointment as soon as possible for a visit in 1 week(s).   Specialty:  Infectious Diseases Contact information: [phone]          No Known Allergies  Consultations: Cardiology/ID  Procedures/Studies: Ct Head Wo Contrast  Result Date: 05/10/2018 CLINICAL DATA:  Altered mental status/confusion EXAM: CT HEAD WITHOUT CONTRAST TECHNIQUE: Contiguous axial images were obtained from the base of the skull through the vertex without intravenous contrast. COMPARISON:  MR brain dated 11/02/2009 FINDINGS: Brain: No evidence of acute infarction, hemorrhage, hydrocephalus, extra-axial collection or mass lesion/mass effect. Subcortical white matter and periventricular small vessel ischemic changes. Vascular: No hyperdense vessel or unexpected calcification. Skull: Normal. Negative for fracture or focal lesion. Sinuses/Orbits: The visualized paranasal sinuses are essentially clear. The mastoid air cells are  unopacified. Other: None. IMPRESSION: No evidence of acute intracranial abnormality. Small vessel ischemic changes. Electronically Signed   By: SrJulian Hy.D.   On: 05/10/2018 02:27   Dg Chest Port 1 View  Result Date: 05/10/2018 CLINICAL DATA:  Mental status changes. EXAM: PORTABLE CHEST 1 VIEW COMPARISON:  07/10/2017 FINDINGS: Normal heart size and pulmonary vascularity. No focal airspace disease or consolidation in the lungs. No blunting of costophrenic angles. No pneumothorax. Mediastinal contours appear intact. Degenerative changes in the shoulders. Old left rib fractures. IMPRESSION: No active disease. Electronically  Signed   By: Lucienne Capers M.D.   On: 05/10/2018 01:01   [place] Ekg Site Rite  Result Date: 05/14/2018 If Site Rite image not attached, placement could not be confirmed due to current cardiac rhythm.   2D echo on 05/12/2018  study Conclusions  - Left ventricle: The cavity size was normal. Wall thickness was   normal. Systolic function was normal. The estimated ejection   fraction was in the range of 60% to 65%. Wall motion was normal;   there were no regional wall motion abnormalities. Left   ventricular diastolic function parameters were normal for the   patient&'s age. - Aortic valve: Valve area (VTI): 2.64 cm^2. Valve area (Vmean):   2.43 cm^2. - Mitral valve: There was mild regurgitation. - Left atrium: The atrium was moderately dilated.  Impressions:  - No obviouis vegetation   Suggest TEE if clinically indicated.  TEE on 05/13/2018 Study Conclusions  - Left ventricle: The cavity size was normal. Wall thickness was   normal. Systolic function was normal. The estimated ejection   fraction was in the range of 60% to 65%. - Aortic valve: No evidence of vegetation. There was trivial   regurgitation. - Mitral valve: There was moderate to severe regurgitation   (multiple jets.) PISA radius 0.6 cm, ERO 0.18 cm2, RV 29 mL.   There is one dominant jet  which quantitates moderate, and two   smaller jets. In total, there is probable moderate to severe   mitral valve regurgitation. No systolic reversals in the   pulmonary vein Doppler. No vegetations. - Left atrium: The atrium was moderately dilated. No evidence of   thrombus in the atrial cavity or appendage. - Right atrium: The atrium was mildly dilated. - Atrial septum: There was a probable patent foramen ovale. - Tricuspid valve: No evidence of vegetation. There was trivial   regurgitation. - Pulmonic valve: No evidence of vegetation. There was trivial   regurgitation.  Impressions:  - No evidence of endocarditis. Moderate to severe mitral valve   regurgitation.   Subjective: Patient seen and examined at bedside.  No fever, nausea or vomiting.  He wants to go home.  He is a poor historian.  Discharge Exam: Vitals:   05/13/18 2146 05/14/18 0450  BP: 140/72 (!) 124/58  Pulse: (!) 56 68  Resp: 18 18  Temp: 97.8 F (36.6 C) 98.4 F (36.9 C)  SpO2: 100% 100%   Vitals:   05/13/18 1432 05/13/18 1703 05/13/18 2146 05/14/18 0450  BP: (!) 149/76 (!) 154/64 140/72 (!) 124/58  Pulse: (!) 49 (!) 53 (!) 56 68  Resp:   18 18  Temp: 98.1 F (36.7 C)  97.8 F (36.6 C) 98.4 F (36.9 C)  TempSrc: Oral  Oral Oral  SpO2: 100%  100% 100%  Weight:    62.9 kg  Height:        General: Pt is alert, awake, not in acute distress.  Answering questions.  Poor historian.  Wants to go home Cardiovascular: rate controlled, S1/S2 + Respiratory: bilateral decreased breath sounds at bases, some scattered crackles Abdominal: Soft, NT, ND, bowel sounds + Extremities: Trace edema, no cyanosis    The results of significant diagnostics from this hospitalization (including imaging, microbiology, ancillary and laboratory) are listed below for reference.     Microbiology: Recent Results (from the past 240 hour(s))  Culture, blood (Routine X 2) w Reflex to ID Panel     Status: Abnormal    Collection Time: 05/10/18  4:52 AM  Result Value Ref Range Status   Specimen Description BLOOD RIGHT ANTECUBITAL  Final   Special Requests   Final    BOTTLES DRAWN AEROBIC AND ANAEROBIC Blood Culture adequate volume   Culture  Setup Time   Final    IN BOTH AEROBIC AND ANAEROBIC BOTTLES GRAM POSITIVE COCCI CRITICAL RESULT CALLED TO, READ BACK BY AND VERIFIED WITHDarene Lamer [facility] PHARMD 05/10/18 2054 JDW Performed at [facility], [address], [place], [place] [address]    Culture STAPHYLOCOCCUS AUREUS (A)  Final   Report Status 05/12/2018 FINAL  Final   Organism ID, Bacteria STAPHYLOCOCCUS AUREUS  Final      Susceptibility   Staphylococcus aureus - MIC*    CIPROFLOXACIN <=0.5 SENSITIVE Sensitive     ERYTHROMYCIN <=0.25 SENSITIVE Sensitive     GENTAMICIN <=0.5 SENSITIVE Sensitive     OXACILLIN 0.5 SENSITIVE Sensitive     TETRACYCLINE <=1 SENSITIVE Sensitive     VANCOMYCIN <=0.5 SENSITIVE Sensitive     TRIMETH/SULFA <=10 SENSITIVE Sensitive     CLINDAMYCIN <=0.25 SENSITIVE Sensitive     RIFAMPIN <=0.5 SENSITIVE Sensitive     Inducible Clindamycin NEGATIVE Sensitive     * STAPHYLOCOCCUS AUREUS  Culture, blood (Routine X 2) w Reflex to ID Panel     Status: Abnormal   Collection Time: 05/10/18  4:52 AM  Result Value Ref Range Status   Specimen Description BLOOD RIGHT ANTECUBITAL  Final   Special Requests   Final    BOTTLES DRAWN AEROBIC AND ANAEROBIC Blood Culture adequate volume   Culture  Setup Time   Final    IN BOTH AEROBIC AND ANAEROBIC BOTTLES GRAM POSITIVE COCCI CRITICAL VALUE NOTED.  VALUE IS CONSISTENT WITH PREVIOUSLY REPORTED AND CALLED VALUE.    Culture (A)  Final    STAPHYLOCOCCUS AUREUS SUSCEPTIBILITIES PERFORMED ON PREVIOUS CULTURE WITHIN THE LAST 5 DAYS. Performed at [facility], [place] [address], [place], [place] [address]    Report Status 05/12/2018 FINAL  Final  Blood Culture ID Panel (Reflexed)     Status: Abnormal   Collection Time: 05/10/18  4:52 AM   Result Value Ref Range Status   Enterococcus species NOT DETECTED NOT DETECTED Final   Listeria monocytogenes NOT DETECTED NOT DETECTED Final   Staphylococcus species DETECTED (A) NOT DETECTED Final    Comment: CRITICAL RESULT CALLED TO, READ BACK BY AND VERIFIED WITH: T PANGDANG PHARMD 05/10/18 2054 JDW    Staphylococcus aureus (BCID) DETECTED (A) NOT DETECTED Final    Comment: Methicillin (oxacillin) susceptible Staphylococcus aureus (MSSA). Preferred therapy is anti staphylococcal beta lactam antibiotic (Cefazolin or Nafcillin), unless clinically contraindicated. CRITICAL RESULT CALLED TO, READ BACK BY AND VERIFIED WITH: T PANGDANG PHARMD 05/10/18 JDW    Methicillin resistance NOT DETECTED NOT DETECTED Final   Streptococcus species NOT DETECTED NOT DETECTED Final   Streptococcus agalactiae NOT DETECTED NOT DETECTED Final   Streptococcus pneumoniae NOT DETECTED NOT DETECTED Final   Streptococcus pyogenes NOT DETECTED NOT DETECTED Final   Acinetobacter baumannii NOT DETECTED NOT DETECTED Final   Enterobacteriaceae species NOT DETECTED NOT DETECTED Final   Enterobacter cloacae complex NOT DETECTED NOT DETECTED Final   Escherichia coli NOT DETECTED NOT DETECTED Final   Klebsiella oxytoca NOT DETECTED NOT DETECTED Final   Klebsiella pneumoniae NOT DETECTED NOT DETECTED Final   Proteus species NOT DETECTED NOT DETECTED Final   Serratia marcescens NOT DETECTED NOT DETECTED Final   Haemophilus influenzae NOT DETECTED NOT DETECTED Final  Neisseria meningitidis NOT DETECTED NOT DETECTED Final   Pseudomonas aeruginosa NOT DETECTED NOT DETECTED Final   Candida albicans NOT DETECTED NOT DETECTED Final   Candida glabrata NOT DETECTED NOT DETECTED Final   Candida krusei NOT DETECTED NOT DETECTED Final   Candida parapsilosis NOT DETECTED NOT DETECTED Final   Candida tropicalis NOT DETECTED NOT DETECTED Final    Comment: Performed at [facility], [place] [address] [place], [place] [address]   MRSA PCR Screening     Status: None   Collection Time: 05/10/18 10:21 AM  Result Value Ref Range Status   MRSA by PCR NEGATIVE NEGATIVE Final    Comment:        The GeneXpert MRSA Assay (FDA approved for NASAL specimens only), is one component of a comprehensive MRSA colonization surveillance program. It is not intended to diagnose MRSA infection nor to guide or monitor treatment for MRSA infections. Performed at [facility], [place] [address], [place], [place] [address]   Culture, blood (routine x 2)     Status: None (Preliminary result)   Collection Time: 05/11/18  3:13 PM  Result Value Ref Range Status   Specimen Description BLOOD LEFT HAND  Final   Special Requests   Final    BOTTLES DRAWN AEROBIC ONLY Blood Culture adequate volume   Culture   Final    NO GROWTH 3 DAYS Performed at [facility], [place] [address] [place], [place] [address]    Report Status PENDING  Incomplete  Culture, blood (routine x 2)     Status: None (Preliminary result)   Collection Time: 05/11/18  4:00 PM  Result Value Ref Range Status   Specimen Description BLOOD LEFT HAND  Final   Special Requests   Final    BOTTLES DRAWN AEROBIC ONLY Blood Culture adequate volume   Culture   Final    NO GROWTH 3 DAYS Performed at [facility], [place] [address] [place],  [address]    Report Status PENDING  Incomplete     Labs: BNP (last 3 results) No results for input(s): BNP in the last 8760 hours. Basic Metabolic Panel: Recent Labs  Lab 05/10/18 0452 05/11/18 0842 05/12/18 0418 05/13/18 0310 05/14/18 0435  NA 138 144 142 140 140  K 5.1 3.3* 3.8 3.8 3.8  CL 112* 120* 120* 115* 113*  CO2 17* 15* 14* 16* 18*  GLUCOSE 81 81 142* 97 98  BUN '14 14 14 13 14  ' CREATININE 1.55* 1.44* 1.33* 1.35* 1.15  CALCIUM 8.1* 8.3* 8.4* 8.5* 8.4*  MG  --   --   --  1.6* 1.7   Liver Function Tests: Recent Labs  Lab 05/10/18 0042 05/10/18 0452 05/13/18 0310 05/14/18 0435  AST 52* 62* 31  39  ALT '23 28 14 13  ' ALKPHOS 87 91 61 74  BILITOT 2.9* 3.8* 1.1 1.0  PROT 6.0* 6.4* 6.4* 6.0*  ALBUMIN 2.1* 2.2* 2.6* 2.4*   No results for input(s): LIPASE, AMYLASE in the last 168 hours. Recent Labs  Lab 05/10/18 0042 05/11/18 0842  AMMONIA 203* 27   CBC: Recent Labs  Lab 05/10/18 0042 05/11/18 0842 05/12/18 0418 05/13/18 0310 05/13/18 1139  WBC 2.8* 5.5 5.7 8.1 8.6  NEUTROABS 1.7  --   --  6.1 6.5  HGB 9.9* 7.7* 7.6* 8.1* 9.1*  HCT 29.9* 23.1* 22.6* 24.0* 28.6*  MCV 106.0* 105.0* 107.1* 103.9* 109.6*  PLT 75* 86* 89* 95* 89*   Cardiac  Enzymes: No results for input(s): CKTOTAL, CKMB, CKMBINDEX, TROPONINI in the last 168 hours. BNP: Invalid input(s): POCBNP CBG: No results for input(s): GLUCAP in the last 168 hours. D-Dimer No results for input(s): DDIMER in the last 72 hours. Hgb A1c No results for input(s): HGBA1C in the last 72 hours. Lipid Profile No results for input(s): CHOL, HDL, LDLCALC, TRIG, CHOLHDL, LDLDIRECT in the last 72 hours. Thyroid function studies No results for input(s): TSH, T4TOTAL, T3FREE, THYROIDAB in the last 72 hours.  Invalid input(s): FREET3 Anemia work up No results for input(s): VITAMINB12, FOLATE, FERRITIN, TIBC, IRON, RETICCTPCT in the last 72 hours. Urinalysis    Component Value Date/Time   COLORURINE YELLOW 05/10/2018 0310   APPEARANCEUR CLEAR 05/10/2018 0310   LABSPEC 1.010 05/10/2018 0310   PHURINE 5.0 05/10/2018 0310   GLUCOSEU NEGATIVE 05/10/2018 0310   HGBUR SMALL (A) 05/10/2018 0310   BILIRUBINUR NEGATIVE 05/10/2018 0310   KETONESUR NEGATIVE 05/10/2018 0310   PROTEINUR NEGATIVE 05/10/2018 0310   UROBILINOGEN 2.0 (H) 04/26/2013 0945   NITRITE NEGATIVE 05/10/2018 0310   LEUKOCYTESUR NEGATIVE 05/10/2018 0310   Sepsis Labs Invalid input(s): PROCALCITONIN,  WBC,  LACTICIDVEN Microbiology Recent Results (from the past 240 hour(s))  Culture, blood (Routine X 2) w Reflex to ID Panel     Status: Abnormal   Collection  Time: 05/10/18  4:52 AM  Result Value Ref Range Status   Specimen Description BLOOD RIGHT ANTECUBITAL  Final   Special Requests   Final    BOTTLES DRAWN AEROBIC AND ANAEROBIC Blood Culture adequate volume   Culture  Setup Time   Final    IN BOTH AEROBIC AND ANAEROBIC BOTTLES GRAM POSITIVE COCCI CRITICAL RESULT CALLED TO, READ BACK BY AND VERIFIED WITHRito Ehrlich PHARMD 05/10/18 2054 JDW Performed at [facility], [place] [address] [place], [place] [address]    Culture STAPHYLOCOCCUS AUREUS (A)  Final   Report Status 05/12/2018 FINAL  Final   Organism ID, Bacteria STAPHYLOCOCCUS AUREUS  Final      Susceptibility   Staphylococcus aureus - MIC*    CIPROFLOXACIN <=0.5 SENSITIVE Sensitive     ERYTHROMYCIN <=0.25 SENSITIVE Sensitive     GENTAMICIN <=0.5 SENSITIVE Sensitive     OXACILLIN 0.5 SENSITIVE Sensitive     TETRACYCLINE <=1 SENSITIVE Sensitive     VANCOMYCIN <=0.5 SENSITIVE Sensitive     TRIMETH/SULFA <=10 SENSITIVE Sensitive     CLINDAMYCIN <=0.25 SENSITIVE Sensitive     RIFAMPIN <=0.5 SENSITIVE Sensitive     Inducible Clindamycin NEGATIVE Sensitive     * STAPHYLOCOCCUS AUREUS  Culture, blood (Routine X 2) w Reflex to ID Panel     Status: Abnormal   Collection Time: 05/10/18  4:52 AM  Result Value Ref Range Status   Specimen Description BLOOD RIGHT ANTECUBITAL  Final   Special Requests   Final    BOTTLES DRAWN AEROBIC AND ANAEROBIC Blood Culture adequate volume   Culture  Setup Time   Final    IN BOTH AEROBIC AND ANAEROBIC BOTTLES GRAM POSITIVE COCCI CRITICAL VALUE NOTED.  VALUE IS CONSISTENT WITH PREVIOUSLY REPORTED AND CALLED VALUE.    Culture (A)  Final    STAPHYLOCOCCUS AUREUS SUSCEPTIBILITIES PERFORMED ON PREVIOUS CULTURE WITHIN THE LAST 5 DAYS. Performed at [facility], [place] [address], [place], [place] [address]    Report Status 05/12/2018 FINAL  Final  Blood Culture ID Panel (Reflexed)     Status: Abnormal   Collection Time: 05/10/18  4:52 AM  Result  Value Ref Range Status   Enterococcus species NOT DETECTED NOT DETECTED Final   Listeria monocytogenes NOT DETECTED NOT DETECTED Final   Staphylococcus species DETECTED (A) NOT DETECTED Final    Comment: CRITICAL RESULT CALLED TO, READ BACK BY AND VERIFIED WITH: T PANGDANG PHARMD 05/10/18 2054 JDW    Staphylococcus aureus (BCID) DETECTED (A) NOT DETECTED Final    Comment: Methicillin (oxacillin) susceptible Staphylococcus aureus (MSSA). Preferred therapy is anti staphylococcal beta lactam antibiotic (Cefazolin or Nafcillin), unless clinically contraindicated. CRITICAL RESULT CALLED TO, READ BACK BY AND VERIFIED WITH: T PANGDANG PHARMD 05/10/18 JDW    Methicillin resistance NOT DETECTED NOT DETECTED Final   Streptococcus species NOT DETECTED NOT DETECTED Final   Streptococcus agalactiae NOT DETECTED NOT DETECTED Final   Streptococcus pneumoniae NOT DETECTED NOT DETECTED Final   Streptococcus pyogenes NOT DETECTED NOT DETECTED Final   Acinetobacter baumannii NOT DETECTED NOT DETECTED Final   Enterobacteriaceae species NOT DETECTED NOT DETECTED Final   Enterobacter cloacae complex NOT DETECTED NOT DETECTED Final   Escherichia coli NOT DETECTED NOT DETECTED Final   Klebsiella oxytoca NOT DETECTED NOT DETECTED Final   Klebsiella pneumoniae NOT DETECTED NOT DETECTED Final   Proteus species NOT DETECTED NOT DETECTED Final   Serratia marcescens NOT DETECTED NOT DETECTED Final   Haemophilus influenzae NOT DETECTED NOT DETECTED Final   Neisseria meningitidis NOT DETECTED NOT DETECTED Final   Pseudomonas aeruginosa NOT DETECTED NOT DETECTED Final   Candida albicans NOT DETECTED NOT DETECTED Final   Candida glabrata NOT DETECTED NOT DETECTED Final   Candida krusei NOT DETECTED NOT DETECTED Final   Candida parapsilosis NOT DETECTED NOT DETECTED Final   Candida tropicalis NOT DETECTED NOT DETECTED Final    Comment: Performed at [facility], [place] [address] [place], [place] [address]  MRSA  PCR Screening     Status: None   Collection Time: 05/10/18 10:21 AM  Result Value Ref Range Status   MRSA by PCR NEGATIVE NEGATIVE Final    Comment:        The GeneXpert MRSA Assay (FDA approved for NASAL specimens only), is one component of a comprehensive MRSA colonization surveillance program. It is not intended to diagnose MRSA infection nor to guide or monitor treatment for MRSA infections. Performed at [facility], [place] [address], [place], [place] [address]   Culture, blood (routine x 2)     Status: None (Preliminary result)   Collection Time: 05/11/18  3:13 PM  Result Value Ref Range Status   Specimen Description BLOOD LEFT HAND  Final   Special Requests   Final    BOTTLES DRAWN AEROBIC ONLY Blood Culture adequate volume   Culture   Final    NO GROWTH 3 DAYS Performed at [facility], [place] [address] [place], [place] [address]    Report Status PENDING  Incomplete  Culture, blood (routine x 2)     Status: None (Preliminary result)   Collection Time: 05/11/18  4:00 PM  Result Value Ref Range Status   Specimen Description BLOOD LEFT HAND  Final   Special Requests   Final    BOTTLES DRAWN AEROBIC ONLY Blood Culture adequate volume   Culture   Final    NO GROWTH 3 DAYS Performed at [facility], [place] [address] [place], [place] [address]    Report Status PENDING  Incomplete     Time coordinating discharge: 35 minutes  SIGNED:   Aline August, MD  [facility]  05/14/2018, 10:05 AM Pager: [phone]  If 7PM-7AM, please contact night-coverage [email or web address] Password [email or web address]

## 2018-05-16 LAB — CULTURE, BLOOD (ROUTINE X 2)
Culture: NO GROWTH
Culture: NO GROWTH
Special Requests: ADEQUATE
Special Requests: ADEQUATE

## 2018-05-17 ENCOUNTER — Encounter (HOSPITAL_COMMUNITY): Payer: Self-pay | Admitting: Internal Medicine

## 2018-05-18 ENCOUNTER — Encounter (HOSPITAL_COMMUNITY): Payer: Self-pay | Admitting: Emergency Medicine

## 2018-05-18 ENCOUNTER — Inpatient Hospital Stay (HOSPITAL_COMMUNITY)
Admission: EM | Admit: 2018-05-18 | Discharge: 2018-05-21 | DRG: 442 | Disposition: A | Discharge status: Home Health | Payer: BC Managed Care – PPO | Attending: Family Medicine | Admitting: Family Medicine

## 2018-05-18 ENCOUNTER — Emergency Department (HOSPITAL_COMMUNITY): Payer: BC Managed Care – PPO

## 2018-05-18 ENCOUNTER — Other Ambulatory Visit: Payer: Self-pay

## 2018-05-18 DIAGNOSIS — F1910 Other psychoactive substance abuse, uncomplicated: Secondary | ICD-10-CM | POA: Diagnosis present

## 2018-05-18 DIAGNOSIS — R7881 Bacteremia: Secondary | ICD-10-CM | POA: Diagnosis present

## 2018-05-18 DIAGNOSIS — F1010 Alcohol abuse, uncomplicated: Secondary | ICD-10-CM | POA: Diagnosis not present

## 2018-05-18 DIAGNOSIS — N183 Chronic kidney disease, stage 3 unspecified: Secondary | ICD-10-CM | POA: Diagnosis present

## 2018-05-18 DIAGNOSIS — Z978 Presence of other specified devices: Secondary | ICD-10-CM

## 2018-05-18 DIAGNOSIS — K766 Portal hypertension: Secondary | ICD-10-CM | POA: Diagnosis present

## 2018-05-18 DIAGNOSIS — I252 Old myocardial infarction: Secondary | ICD-10-CM

## 2018-05-18 DIAGNOSIS — Z96651 Presence of right artificial knee joint: Secondary | ICD-10-CM | POA: Diagnosis present

## 2018-05-18 DIAGNOSIS — B9561 Methicillin susceptible Staphylococcus aureus infection as the cause of diseases classified elsewhere: Secondary | ICD-10-CM | POA: Diagnosis present

## 2018-05-18 DIAGNOSIS — R4182 Altered mental status, unspecified: Secondary | ICD-10-CM

## 2018-05-18 DIAGNOSIS — E785 Hyperlipidemia, unspecified: Secondary | ICD-10-CM | POA: Diagnosis present

## 2018-05-18 DIAGNOSIS — N179 Acute kidney failure, unspecified: Secondary | ICD-10-CM | POA: Diagnosis present

## 2018-05-18 DIAGNOSIS — I2510 Atherosclerotic heart disease of native coronary artery without angina pectoris: Secondary | ICD-10-CM | POA: Diagnosis present

## 2018-05-18 DIAGNOSIS — Z87891 Personal history of nicotine dependence: Secondary | ICD-10-CM | POA: Diagnosis not present

## 2018-05-18 DIAGNOSIS — E875 Hyperkalemia: Secondary | ICD-10-CM | POA: Diagnosis present

## 2018-05-18 DIAGNOSIS — Z9114 Patient's other noncompliance with medication regimen: Secondary | ICD-10-CM

## 2018-05-18 DIAGNOSIS — D509 Iron deficiency anemia, unspecified: Secondary | ICD-10-CM | POA: Diagnosis not present

## 2018-05-18 DIAGNOSIS — Z8673 Personal history of transient ischemic attack (TIA), and cerebral infarction without residual deficits: Secondary | ICD-10-CM | POA: Diagnosis not present

## 2018-05-18 DIAGNOSIS — K7290 Hepatic failure, unspecified without coma: Principal | ICD-10-CM | POA: Diagnosis present

## 2018-05-18 DIAGNOSIS — K7682 Hepatic encephalopathy: Secondary | ICD-10-CM | POA: Diagnosis present

## 2018-05-18 DIAGNOSIS — Z79899 Other long term (current) drug therapy: Secondary | ICD-10-CM

## 2018-05-18 DIAGNOSIS — Z7951 Long term (current) use of inhaled steroids: Secondary | ICD-10-CM | POA: Diagnosis not present

## 2018-05-18 DIAGNOSIS — D61818 Other pancytopenia: Secondary | ICD-10-CM | POA: Diagnosis present

## 2018-05-18 DIAGNOSIS — K7030 Alcoholic cirrhosis of liver without ascites: Secondary | ICD-10-CM | POA: Diagnosis present

## 2018-05-18 DIAGNOSIS — I129 Hypertensive chronic kidney disease with stage 1 through stage 4 chronic kidney disease, or unspecified chronic kidney disease: Secondary | ICD-10-CM | POA: Diagnosis present

## 2018-05-18 LAB — CBC WITH DIFFERENTIAL/PLATELET
Abs Immature Granulocytes: 0.01 10*3/uL (ref 0.00–0.07)
Basophils Absolute: 0 10*3/uL (ref 0.0–0.1)
Basophils Relative: 1 %
Eosinophils Absolute: 0.6 10*3/uL — ABNORMAL HIGH (ref 0.0–0.5)
Eosinophils Relative: 16 %
HCT: 24.9 % — ABNORMAL LOW (ref 39.0–52.0)
Hemoglobin: 8.1 g/dL — ABNORMAL LOW (ref 13.0–17.0)
Immature Granulocytes: 0 %
Lymphocytes Relative: 15 %
Lymphs Abs: 0.6 10*3/uL — ABNORMAL LOW (ref 0.7–4.0)
MCH: 36.5 pg — ABNORMAL HIGH (ref 26.0–34.0)
MCHC: 32.5 g/dL (ref 30.0–36.0)
MCV: 112.2 fL — ABNORMAL HIGH (ref 80.0–100.0)
Monocytes Absolute: 0.5 10*3/uL (ref 0.1–1.0)
Monocytes Relative: 13 %
Neutro Abs: 2.2 10*3/uL (ref 1.7–7.7)
Neutrophils Relative %: 55 %
Platelets: 84 10*3/uL — ABNORMAL LOW (ref 150–400)
RBC: 2.22 MIL/uL — ABNORMAL LOW (ref 4.22–5.81)
RDW: 17.6 % — ABNORMAL HIGH (ref 11.5–15.5)
WBC: 3.9 10*3/uL — ABNORMAL LOW (ref 4.0–10.5)
nRBC: 0 % (ref 0.0–0.2)

## 2018-05-18 LAB — BASIC METABOLIC PANEL
Anion gap: 7 (ref 5–15)
BUN: 42 mg/dL — ABNORMAL HIGH (ref 8–23)
CO2: 20 mmol/L — ABNORMAL LOW (ref 22–32)
Calcium: 9.1 mg/dL (ref 8.9–10.3)
Chloride: 114 mmol/L — ABNORMAL HIGH (ref 98–111)
Creatinine, Ser: 2 mg/dL — ABNORMAL HIGH (ref 0.61–1.24)
GFR calc Af Amer: 40 mL/min — ABNORMAL LOW (ref >60)
GFR calc non Af Amer: 34 mL/min — ABNORMAL LOW (ref >60)
Glucose, Bld: 100 mg/dL — ABNORMAL HIGH (ref 70–99)
Potassium: 6.1 mmol/L — ABNORMAL HIGH (ref 3.5–5.1)
Sodium: 141 mmol/L (ref 135–145)

## 2018-05-18 LAB — RAPID URINE DRUG SCREEN, HOSP PERFORMED
Amphetamines: NOT DETECTED
Barbiturates: NOT DETECTED
Benzodiazepines: NOT DETECTED
Cocaine: NOT DETECTED
Opiates: POSITIVE — AB
Tetrahydrocannabinol: NOT DETECTED

## 2018-05-18 LAB — COMPREHENSIVE METABOLIC PANEL
ALT: 5 U/L (ref 0–44)
AST: 34 U/L (ref 15–41)
Albumin: 2.9 g/dL — ABNORMAL LOW (ref 3.5–5.0)
Alkaline Phosphatase: 103 U/L (ref 38–126)
Anion gap: 6 (ref 5–15)
BUN: 42 mg/dL — ABNORMAL HIGH (ref 8–23)
CO2: 23 mmol/L (ref 22–32)
Calcium: 9.1 mg/dL (ref 8.9–10.3)
Chloride: 110 mmol/L (ref 98–111)
Creatinine, Ser: 2.12 mg/dL — ABNORMAL HIGH (ref 0.61–1.24)
GFR calc Af Amer: 37 mL/min — ABNORMAL LOW (ref >60)
GFR calc non Af Amer: 32 mL/min — ABNORMAL LOW (ref >60)
Glucose, Bld: 109 mg/dL — ABNORMAL HIGH (ref 70–99)
Potassium: 6 mmol/L — ABNORMAL HIGH (ref 3.5–5.1)
Sodium: 139 mmol/L (ref 135–145)
Total Bilirubin: 1.5 mg/dL — ABNORMAL HIGH (ref 0.3–1.2)
Total Protein: 6.9 g/dL (ref 6.5–8.1)

## 2018-05-18 LAB — URINALYSIS, ROUTINE W REFLEX MICROSCOPIC
Bilirubin Urine: NEGATIVE
Glucose, UA: NEGATIVE mg/dL
Hgb urine dipstick: NEGATIVE
Ketones, ur: NEGATIVE mg/dL
Leukocytes, UA: NEGATIVE
Nitrite: NEGATIVE
Protein, ur: NEGATIVE mg/dL
Specific Gravity, Urine: 1.012 (ref 1.005–1.030)
pH: 8 (ref 5.0–8.0)

## 2018-05-18 LAB — AMMONIA: Ammonia: 254 umol/L — ABNORMAL HIGH (ref 9–35)

## 2018-05-18 LAB — ETHANOL
Alcohol, Ethyl (B): <10 mg/dL (ref <10)
Alcohol, Ethyl (B): <10 mg/dL (ref <10)

## 2018-05-18 LAB — POTASSIUM: Potassium: 6 mmol/L — ABNORMAL HIGH (ref 3.5–5.1)

## 2018-05-18 MED ORDER — SODIUM CHLORIDE 0.9 % IV SOLN: INTRAVENOUS | Status: DC | PRN

## 2018-05-18 MED ORDER — GUAIFENESIN ER 600 MG PO TB12: 600.0000 mg | ORAL_TABLET | Freq: Two times a day (BID) | ORAL | Status: DC | PRN

## 2018-05-18 MED ORDER — NADOLOL 20 MG PO TABS
20.0000 mg | ORAL_TABLET | Freq: Every day | ORAL | Status: DC
  Administered 2018-05-20 – 2018-05-21 (×2): 20 mg via ORAL
  Filled 2018-05-18 (×3): qty 1

## 2018-05-18 MED ORDER — ALBUTEROL SULFATE (2.5 MG/3ML) 0.083% IN NEBU: 2.5000 mg | INHALATION_SOLUTION | Freq: Once | RESPIRATORY_TRACT | Status: DC

## 2018-05-18 MED ORDER — LORAZEPAM 2 MG/ML IJ SOLN
1.0000 mg | Freq: Once | INTRAMUSCULAR | Status: AC
  Administered 2018-05-18: 1 mg via INTRAVENOUS
  Filled 2018-05-18: qty 1

## 2018-05-18 MED ORDER — SPIRONOLACTONE 100 MG PO TABS: 100.0000 mg | ORAL_TABLET | Freq: Two times a day (BID) | ORAL | Status: DC

## 2018-05-18 MED ORDER — LACTULOSE ENEMA
300.0000 mL | Freq: Once | ORAL | Status: DC
  Filled 2018-05-18: qty 300

## 2018-05-18 MED ORDER — LORAZEPAM 2 MG/ML IJ SOLN: 2.0000 mg | INTRAMUSCULAR | Status: AC

## 2018-05-18 MED ORDER — CEFAZOLIN SODIUM-DEXTROSE 2-4 GM/100ML-% IV SOLN
2.0000 g | Freq: Three times a day (TID) | INTRAVENOUS | Status: DC
  Administered 2018-05-19 – 2018-05-21 (×7): 2 g via INTRAVENOUS
  Filled 2018-05-18 (×10): qty 100

## 2018-05-18 MED ORDER — FUROSEMIDE 20 MG PO TABS: 20.0000 mg | ORAL_TABLET | Freq: Two times a day (BID) | ORAL | Status: DC

## 2018-05-18 MED ORDER — SODIUM POLYSTYRENE SULFONATE 15 GM/60ML PO SUSP
30.0000 g | Freq: Once | ORAL | Status: AC
  Administered 2018-05-18: 30 g via RECTAL
  Filled 2018-05-18: qty 120

## 2018-05-18 MED ORDER — LACTULOSE 10 GM/15ML PO SOLN
30.0000 g | Freq: Two times a day (BID) | ORAL | Status: DC
  Administered 2018-05-20 – 2018-05-21 (×3): 30 g via ORAL
  Filled 2018-05-18 (×4): qty 45

## 2018-05-18 MED ORDER — FOLIC ACID 1 MG PO TABS
1.0000 mg | ORAL_TABLET | Freq: Every day | ORAL | Status: DC
  Administered 2018-05-20 – 2018-05-21 (×2): 1 mg via ORAL
  Filled 2018-05-18 (×2): qty 1

## 2018-05-18 MED ORDER — BISACODYL 10 MG RE SUPP: 10.0000 mg | Freq: Every day | RECTAL | Status: DC | PRN

## 2018-05-18 MED ORDER — ENOXAPARIN SODIUM 30 MG/0.3ML ~~LOC~~ SOLN
30.0000 mg | SUBCUTANEOUS | Status: DC
  Administered 2018-05-18 – 2018-05-19 (×2): 30 mg via SUBCUTANEOUS
  Filled 2018-05-18 (×2): qty 0.3

## 2018-05-18 MED ORDER — ONDANSETRON HCL 4 MG PO TABS: 4.0000 mg | ORAL_TABLET | Freq: Four times a day (QID) | ORAL | Status: DC | PRN

## 2018-05-18 MED ORDER — DOCUSATE SODIUM 100 MG PO CAPS: 100.0000 mg | ORAL_CAPSULE | Freq: Two times a day (BID) | ORAL | Status: DC | PRN

## 2018-05-18 MED ORDER — ALBUTEROL SULFATE (2.5 MG/3ML) 0.083% IN NEBU: 3.0000 mL | INHALATION_SOLUTION | Freq: Four times a day (QID) | RESPIRATORY_TRACT | Status: DC | PRN

## 2018-05-18 MED ORDER — CALCIUM GLUCONATE-NACL 1-0.675 GM/50ML-% IV SOLN
1.0000 g | Freq: Once | INTRAVENOUS | Status: AC
  Administered 2018-05-19: 1000 mg via INTRAVENOUS

## 2018-05-18 MED ORDER — ONDANSETRON HCL 4 MG/2ML IJ SOLN: 4.0000 mg | Freq: Four times a day (QID) | INTRAMUSCULAR | Status: DC | PRN

## 2018-05-18 MED ORDER — LACTULOSE 10 GM/15ML PO SOLN
30.0000 g | Freq: Once | ORAL | Status: DC
  Filled 2018-05-18: qty 45

## 2018-05-18 MED ORDER — VITAMIN B-1 100 MG PO TABS
100.0000 mg | ORAL_TABLET | Freq: Every day | ORAL | Status: DC
  Administered 2018-05-20 – 2018-05-21 (×2): 100 mg via ORAL
  Filled 2018-05-18 (×2): qty 1

## 2018-05-18 MED ORDER — THIAMINE HCL 100 MG PO TABS: 100.0000 mg | ORAL_TABLET | Freq: Every day | ORAL | Status: DC

## 2018-05-18 MED ORDER — CEFAZOLIN IV (FOR PTA / DISCHARGE USE ONLY): 2.0000 g | Freq: Three times a day (TID) | INTRAVENOUS | Status: DC

## 2018-05-18 MED ORDER — PANTOPRAZOLE SODIUM 40 MG PO TBEC
40.0000 mg | DELAYED_RELEASE_TABLET | Freq: Every day | ORAL | Status: DC
  Administered 2018-05-20 – 2018-05-21 (×2): 40 mg via ORAL
  Filled 2018-05-18 (×2): qty 1

## 2018-05-18 NOTE — H&P (Addendum)
History and Physical    Mark [name] WRU:[id number] DOB: December 02, 1955 DOA: 05/18/2018  PCP: [name], MD Consultants:  none Patient coming from: home- lives with wife  Chief Complaint: Agitation/combativeness  HPI: Mark [name] is a 63 y.o. male with medical history significant for alcoholic liver cirrhosis with ascites, pancytopenia, hypertension, stroke, CAD, polysubstance abuse (alcohol, heroin, marijuana), CKD stage III who was just admitted here on 05/10/2018 for hepatic encephalopathy who was brought to the ED today due to AMS and combative behavior. He presented last admission with altered mental status; at that time mmonia level was 203, blood cultures were positive for MSSA and he was started on Ancef. Bacteremia felt to have most likely a skin source, no signs of peritonitis or urinary infection.  MS improved on lactulose.  He was continued on Spironolactone and Lasix.  The patient has chronic microcytic anemia most likely related to alcohol abuse.  Hemoglobin was 7.6.  UDS was positive for narcotics and benzodiazepines. TTE at the time showed EF 60-65% with no regional WMA; no obvious vegetation.  TEE 1/16 showed no evidence of endocarditis; likely mod-severe MR.He was discharged on 1/17. He was brought back today reportedly b/c he was having combative behavior again. His wife called today to report this but she is not currently present in the ED.   ED Course: Pt was somnolent but arousable. VS were stable.  K was 6, also 6 on recheck. He was given rectal lactulose.  Review of Systems: As per HPI; otherwise review of systems reviewed and negative.   Ambulatory Status:  Ambulates without assistance  Past Medical History:  Diagnosis Date  . Alcohol abuse   . Anemia   . Arthritis   . Ascites   . Cirrhosis ([place])   . Coffee ground emesis   . Dehydration 06/17/2017  . Febrile illness   . Heart murmur   . Hyperlipidemia   . Hypertension   . Leg swelling   .  Myocardial infarction ([place]) 2012  . Preop cardiovascular exam 04/14/2013  . Sepsis ([place]) 06/17/2017  . Septic shock ([place]) 06/18/2017  . SIRS (systemic inflammatory response syndrome) ([place]) 07/11/2017  . Stroke [facility]) 2012   no deficits  . Thrombocytopenia ([place])     Past Surgical History:  Procedure Laterality Date  . COLONOSCOPY WITH PROPOFOL N/A 02/07/2016   Procedure: COLONOSCOPY WITH PROPOFOL;  Surgeon: Milus Banister, MD;  Location: [facility] ENDOSCOPY;  Service: Endoscopy;  Laterality: N/A;  . ESOPHAGOGASTRODUODENOSCOPY (EGD) WITH PROPOFOL N/A 02/07/2016   Procedure: ESOPHAGOGASTRODUODENOSCOPY (EGD) WITH PROPOFOL;  Surgeon: Milus Banister, MD;  Location: [facility] ENDOSCOPY;  Service: Endoscopy;  Laterality: N/A;  . ESOPHAGOGASTRODUODENOSCOPY (EGD) WITH PROPOFOL N/A 06/22/2017   Procedure: ESOPHAGOGASTRODUODENOSCOPY (EGD) WITH PROPOFOL;  Surgeon: Jerene Bears, MD;  Location: [facility] ENDOSCOPY;  Service: Gastroenterology;  Laterality: N/A;  . HERNIA REPAIR Right    inguinal  . KNEE ARTHROSCOPY     bilateral/  12/14  . TEE WITHOUT CARDIOVERSION N/A 05/13/2018   Procedure: TRANSESOPHAGEAL ECHOCARDIOGRAM (TEE);  Surgeon: [name], MD;  Location: [facility] ENDOSCOPY;  Service: Cardiovascular;  Laterality: N/A;  . TOTAL KNEE ARTHROPLASTY Right 05/02/2013   Procedure: RIGHT TOTAL KNEE ARTHROPLASTY;  Surgeon: Mauri Pole, MD;  Location: [facility] ORS;  Service: Orthopedics;  Laterality: Right;    Social History   Socioeconomic History  . Marital status: Married    Spouse name: Not on file  . Number of children: 4  . Years of education: Not on file  .  Highest education level: Not on file  Occupational History  . Occupation: Maintenance    Employer: [employer]  Social Needs  . Financial resource strain: Not on file  . Food insecurity:    Worry: Not on file    Inability: Not on file  . Transportation needs:    Medical: Not on file    Non-medical: Not on file  Tobacco Use  . Smoking status:  Former Smoker    Packs/day: 0.50    Years: 10.00    Pack years: 5.00    Types: Cigarettes    Last attempt to quit: 01/05/2016    Years since quitting: 2.3  . Smokeless tobacco: Never Used  . Tobacco comment: 4 cigarettes a day  Substance and Sexual Activity  . Alcohol use: No    Comment: 40 oz per week as of 01-19-17  . Drug use: Yes    Types: Marijuana, Heroin    Comment: took about 1 month ago- as of 01-19-17  . Sexual activity: Not on file    Comment: now and then smokies marijuana  Lifestyle  . Physical activity:    Days per week: Not on file    Minutes per session: Not on file  . Stress: Not on file  Relationships  . Social connections:    Talks on phone: Not on file    Gets together: Not on file    Attends religious service: Not on file    Active member of club or organization: Not on file    Attends meetings of clubs or organizations: Not on file    Relationship status: Not on file  . Intimate partner violence:    Fear of current or ex partner: Not on file    Emotionally abused: Not on file    Physically abused: Not on file    Forced sexual activity: Not on file  Other Topics Concern  . Not on file  Social History Narrative  . Not on file    No Known Allergies  Family History  Problem Relation Age of Onset  . Hypertension Father   . Diabetes Father   . Dementia Mother   . Lupus Sister     Prior to Admission medications   Medication Sig Start Date End Date Taking? Authorizing Provider  albuterol (PROVENTIL HFA;VENTOLIN HFA) 108 (90 Base) MCG/ACT inhaler Inhale 2 puffs into the lungs every 6 (six) hours as needed for wheezing or shortness of breath. 09/16/17  Yes [name], MD  diphenhydrAMINE-zinc acetate (BENADRYL EXTRA STRENGTH) cream Apply 1 application topically 3 (three) times daily as needed for itching. 04/25/18  Yes Domenic Moras, PA-C  folic acid (FOLVITE) 1 MG tablet TAKE 1 TABLET BY MOUTH EVERY DAY Patient taking differently: Take 1 mg by mouth  daily.  03/19/18  Yes [name], MD  furosemide (LASIX) 20 MG tablet TAKE 1 TABLET BY MOUTH TWICE DAILY Patient taking differently: Take 20 mg by mouth 2 (two) times daily.  04/19/18  Yes [name], MD  guaiFENesin (MUCINEX) 600 MG 12 hr tablet Take 1 tablet (600 mg total) by mouth 2 (two) times daily as needed for cough or to loosen phlegm. 07/14/17  Yes Rosita Fire, MD  lactulose (CHRONULAC) 10 GM/15ML solution Take 45 mLs (30 g total) by mouth 2 (two) times daily. 12/03/17  Yes [name], MD  nadolol (CORGARD) 20 MG tablet TAKE 1 TABLET BY MOUTH EVERY DAY Patient taking differently: Take 20 mg  by mouth daily.  02/17/18  Yes [name], MD  pantoprazole (PROTONIX) 40 MG tablet TAKE 1 TABLET(40 MG) BY MOUTH DAILY Patient taking differently: Take 40 mg by mouth daily.  03/03/18  Yes [name], MD  silver sulfADIAZINE (SILVADENE) 1 % cream Apply 1 application topically daily as needed. To  Burn 05/14/18  Yes Aline August, MD  spironolactone (ALDACTONE) 50 MG tablet Take 2 tablets (100 mg total) by mouth 2 (two) times daily. 04/19/18  Yes Levin Erp, PA  thiamine 100 MG tablet Take 1 tablet (100 mg total) by mouth daily. 12/04/17  Yes [name], MD  triamcinolone (KENALOG) 0.025 % ointment Apply 1 application topically 2 (two) times daily as needed (rash). 05/14/18  Yes Aline August, MD  ceFAZolin (ANCEF) IVPB Inject 2 g into the vein every 8 (eight) hours for 10 days. Indication:  MSSA bacteremia Last Day of Therapy:  05/24/2018 Labs - Once weekly:  CBC/D and BMP, Labs - Every other week:  ESR and CRP 05/14/18 05/24/18  Aline August, MD    Physical Exam: Vitals:   05/18/18 1200 05/18/18 1211 05/18/18 1213 05/18/18 1245  BP: 107/74   (!) 141/78  Resp: 13   14  Temp:  (S) (!) 95.5 F (35.3 C) (S) (!) 97.4 F (36.3 C)   TempSrc:  (S) Rectal (S) Axillary   SpO2: 96%   94%  Weight:      Height:         . General: Somnolent but  arousable . Eyes:  PERRL, EOMI, normal lids, iris . ENT:  grossly normal hearing, lips & tongue, mmm. Edentulous . Neck:  supple, no lymphadenopathy . Cardiovascular:  nL S1, S2, normal rate, reg rhythm, no murmur. Marland Kitchen Respiratory:   CTA bilaterally with no wheezes/rales/rhonchi.  Normal respiratory effort. . Abdomen:  soft, NT, ND, NABS . Back:   grossly normal alignment . Skin: bilateral arm excoriations with no purulence/drainage; o/w no rash or lesions seen on limited exam . Musculoskeletal:  grossly normal tone BUE/BLE, good ROM, no bony abnormality or obvious joint deformity . Lower extremities:  1+ BLE edema.  Limited foot exam with no ulcerations.  2+ distal pulses. Marland Kitchen Psychiatric:  grossly normal mood and affect, speech fluent and appropriate, AOx3 Neurologic:  Somnolent but arousable. Not able to follow commands but moving all extremities.   Radiological Exams on Admission: Dg Chest 1 View  Result Date: 05/18/2018 CLINICAL DATA:  63 year old male with altered mental status, combative. EXAM: CHEST  1 VIEW COMPARISON:  05/10/2018 and earlier. FINDINGS: Portable AP upright view at 1139 hours. Right PICC line is in place, tip at the lower SVC level. Normal cardiac size and mediastinal contours. Visualized tracheal air column is within normal limits. Allowing for portable technique the lungs are clear. No pneumothorax. No acute osseous abnormality identified. IMPRESSION: No cardiopulmonary abnormality.  Right PICC line in place. Electronically Signed   By: Genevie Ann M.D.   On: 05/18/2018 11:53    EKG: Independently reviewed.  Date/Time:                  Tuesday May 18 2018 11:02:13 EST Ventricular Rate:         61 PR Interval:                   QRS Duration: 82 QT Interval:                 452 QTC Calculation:  456 R Axis:                         55 Text Interpretation:       Sinus rhythm peaked T waves, more prominent than May 10 2018 Probable anteroseptal infarct, old  Confirmed by Sherwood Gambler [phone]) on 05/18/2018 11:46:46 AM Also confirmed by Sherwood Gambler [phone]), editor Philomena Doheny [phone])  on 05/18/2018 11:58:53 AM   Labs on Admission: I have personally reviewed the available labs and imaging studies at the time of the admission.  Pertinent labs:  Sodium 139 potassium 6.0, same on recheck, chloride 110, CO2 23 glucose 109 BUN 42 creatinine 2.12 (baseline appears to be 1.2-1.5); alkaline phosphatase 103 albumin 2.9 AST 34 ALT 5 total protein 6.9 ammonia 254 total bilirubin 1.5 WBC 3.9 hemoglobin 8.1, slightly below baseline; platelets 84, baseline   Assessment/Plan Principal Problem:   Hepatic encephalopathy ([name]) Active Problems:   Cirrhosis, alcoholic ([name])   H/O: CVA (cerebrovascular accident)   Substance abuse ([place])   CKD (chronic kidney disease) stage 3, GFR 30-59 ml/min ([name])   Anemia, iron deficiency   Alcohol abuse   MSSA bacteremia   Hyperkalemia   Hepatic encephalopathy: Most likely cause is medication noncompliance although patient is unable to give history and there is no family member present to give supplemental history or corroborate this. -admit to medical telemetry -He is status post 1 dose of lactulose PR in the ED -Closely monitor mental status -Continue rectal lactulose until patient is awake enough to take p.o. at which point continue his p.o. lactulose 30 g TID  Hyperkalemia; with peaked T waves on EKG -pt has AKI on CKD and has been on spironolactone -hold spironolactone.  -will give calcium gluconate, albuterol neb -will repeat level in 2 hours now that pt has been given lactulose enema  Alcoholic cirrhosis with portal HTN with h/o gastric varices -cont nadolol -hold diuretics 2/2 AKI  MSSA bacteremia, prior to admission, source felt likely to be skin -cont ancef til 05/24/18 as originally planned  AKI on CKD stage III; likely prerenal  -hold lasix and spironolactone for now; recheck CMP In a.m. -avoid  nephrotoxins  Substance abuse -check UDS  Chronic anemia, due to cirrhosis, alcohol abuse; stable but slightly low end of normal -f/u CBC in a.m.  Alcohol abuse -CIWA protocol if any withdrawal becomes evident -check alcohol level    DVT prophylaxis: lovenox Code Status:  Full  Family Communication: none as yet  Disposition Plan:  Home once clinically improved Consults called: none  Admission status: Admit - It is my clinical opinion that admission to INPATIENT is reasonable and necessary because of the expectation that this patient will require hospital care that crosses at least 2 midnights to treat this condition based on the medical complexity of the problems presented.  Given the aforementioned information, the predictability of an adverse outcome is felt to be significant.     Janora Norlander MD [facility]  If note is complete, please contact covering daytime or nighttime physician. [email or web address] Password [email or web address]  05/18/2018, 1:17 PM

## 2018-05-18 NOTE — ED Notes (Addendum)
Pt confused, attempting to crawl over sides of bed and slide off the stretcher.  This RN along with the assistance of 5 other staff members repositioned patient.  Pt combative and aggressive, unable to console.  Spoke with EDP, Ativan was ordered.

## 2018-05-18 NOTE — ED Notes (Signed)
ED TO INPATIENT HANDOFF REPORT  ED Nurse Name and Phone #:  Jenny Reichmann [phone]  Name/Age/Gender Mark [name] 63 y.o. male Room/Bed: 033C/033C  Code Status   Code Status: Full Code  Home/SNF/Other Home {Patient oriented to nothing Is this baseline? Unknown, family not present  Triage Complete: Triage complete   Chief Complaint ams  Triage Note BIB EMS for C/O AMS and combativeness.  [name] wife reported to EMS, pt becomes agitated and combative when Anomia levels are elevated.  Pt not allowing staff to help at all.   LSN 0200 this AM.    Allergies No Known Allergies  Level of Care/Admitting Diagnosis ED Disposition    ED Disposition Condition [facility] Area: [facility] [[id number]]  Level of Care: Medical Telemetry [104]  Diagnosis: Hepatic encephalopathy ([place]) [572.2.ICD-9-CM]  Admitting Physician: Janora Norlander [[id number]]  Attending Physician: Janora Norlander [[id number]]  Estimated length of stay: 3 - 4 days  Certification:: I certify this patient will need inpatient services for at least 2 midnights  PT Class (Do Not Modify): Inpatient [101]  PT Acc Code (Do Not Modify): Private [1]       Medical/Surgery History Past Medical History:  Diagnosis Date  . Alcohol abuse   . Anemia   . Arthritis   . Ascites   . Cirrhosis ([place])   . Coffee ground emesis   . Dehydration 06/17/2017  . Febrile illness   . Heart murmur   . Hyperlipidemia   . Hypertension   . Leg swelling   . Myocardial infarction ([place]) 2012  . Preop cardiovascular exam 04/14/2013  . Sepsis ([place]) 06/17/2017  . Septic shock ([place]) 06/18/2017  . SIRS (systemic inflammatory response syndrome) ([place]) 07/11/2017  . Stroke [facility]) 2012   no deficits  . Thrombocytopenia ([place])    Past Surgical History:  Procedure Laterality Date  . COLONOSCOPY WITH PROPOFOL N/A 02/07/2016   Procedure: COLONOSCOPY WITH PROPOFOL;  Surgeon: Milus Banister, MD;  Location: [facility] ENDOSCOPY;   Service: Endoscopy;  Laterality: N/A;  . ESOPHAGOGASTRODUODENOSCOPY (EGD) WITH PROPOFOL N/A 02/07/2016   Procedure: ESOPHAGOGASTRODUODENOSCOPY (EGD) WITH PROPOFOL;  Surgeon: Milus Banister, MD;  Location: [facility] ENDOSCOPY;  Service: Endoscopy;  Laterality: N/A;  . ESOPHAGOGASTRODUODENOSCOPY (EGD) WITH PROPOFOL N/A 06/22/2017   Procedure: ESOPHAGOGASTRODUODENOSCOPY (EGD) WITH PROPOFOL;  Surgeon: Jerene Bears, MD;  Location: [facility] ENDOSCOPY;  Service: Gastroenterology;  Laterality: N/A;  . HERNIA REPAIR Right    inguinal  . KNEE ARTHROSCOPY     bilateral/  12/14  . TEE WITHOUT CARDIOVERSION N/A 05/13/2018   Procedure: TRANSESOPHAGEAL ECHOCARDIOGRAM (TEE);  Surgeon: [name], MD;  Location: [facility] ENDOSCOPY;  Service: Cardiovascular;  Laterality: N/A;  . TOTAL KNEE ARTHROPLASTY Right 05/02/2013   Procedure: RIGHT TOTAL KNEE ARTHROPLASTY;  Surgeon: Mauri Pole, MD;  Location: [facility] ORS;  Service: Orthopedics;  Laterality: Right;     IV Location/Drains/Wounds Patient Lines/Drains/Airways Status   Active Line/Drains/Airways    None          Intake/Output Last 24 hours No intake or output data in the 24 hours ending 05/18/18 1626  Labs/Imaging Results for orders placed or performed during the hospital encounter of 05/18/18 (from the past 48 hour(s))  Ammonia     Status: Abnormal   Collection Time: 05/18/18 11:00 AM  Result Value Ref Range   Ammonia 254 (H) 9 - 35 umol/L    Comment: Performed at [facility], [address], [place], [place]  [address]  CBC with Differential     Status: Abnormal   Collection Time: 05/18/18 11:05 AM  Result Value Ref Range   WBC 3.9 (L) 4.0 - 10.5 K/uL   RBC 2.22 (L) 4.22 - 5.81 MIL/uL   Hemoglobin 8.1 (L) 13.0 - 17.0 g/dL   HCT 24.9 (L) 39.0 - 52.0 %   MCV 112.2 (H) 80.0 - 100.0 fL   MCH 36.5 (H) 26.0 - 34.0 pg   MCHC 32.5 30.0 - 36.0 g/dL   RDW 17.6 (H) 11.5 - 15.5 %   Platelets 84 (L) 150 - 400 K/uL    Comment: REPEATED TO VERIFY PLATELET  COUNT CONFIRMED BY SMEAR SPECIMEN CHECKED FOR CLOTS Immature Platelet Fraction may be clinically indicated, consider ordering this additional test [id number]    nRBC 0.0 0.0 - 0.2 %   Neutrophils Relative % 55 %   Neutro Abs 2.2 1.7 - 7.7 K/uL   Lymphocytes Relative 15 %   Lymphs Abs 0.6 (L) 0.7 - 4.0 K/uL   Monocytes Relative 13 %   Monocytes Absolute 0.5 0.1 - 1.0 K/uL   Eosinophils Relative 16 %   Eosinophils Absolute 0.6 (H) 0.0 - 0.5 K/uL   Basophils Relative 1 %   Basophils Absolute 0.0 0.0 - 0.1 K/uL   Immature Granulocytes 0 %   Abs Immature Granulocytes 0.01 0.00 - 0.07 K/uL   Target Cells PRESENT     Comment: Performed at [facility], [place] [address], [place], [place] [address]  Comprehensive metabolic panel     Status: Abnormal   Collection Time: 05/18/18 11:05 AM  Result Value Ref Range   Sodium 139 135 - 145 mmol/L   Potassium 6.0 (H) 3.5 - 5.1 mmol/L   Chloride 110 98 - 111 mmol/L   CO2 23 22 - 32 mmol/L   Glucose, Bld 109 (H) 70 - 99 mg/dL   BUN 42 (H) 8 - 23 mg/dL   Creatinine, Ser 2.12 (H) 0.61 - 1.24 mg/dL   Calcium 9.1 8.9 - 10.3 mg/dL   Total Protein 6.9 6.5 - 8.1 g/dL   Albumin 2.9 (L) 3.5 - 5.0 g/dL   AST 34 15 - 41 U/L   ALT 5 0 - 44 U/L   Alkaline Phosphatase 103 38 - 126 U/L   Total Bilirubin 1.5 (H) 0.3 - 1.2 mg/dL   GFR calc non Af Amer 32 (L) >60 mL/min   GFR calc Af Amer 37 (L) >60 mL/min   Anion gap 6 5 - 15    Comment: Performed at [facility], [place] [address] [place], [place] [address]  Ethanol     Status: None   Collection Time: 05/18/18 11:42 AM  Result Value Ref Range   Alcohol, Ethyl (B) <10 <10 mg/dL    Comment: (NOTE) Lowest detectable limit for serum alcohol is 10 mg/dL. For medical purposes only. Performed at [facility], [place] [address] [place], [place] [address]   Potassium     Status: Abnormal   Collection Time: 05/18/18 12:11 PM  Result Value Ref Range   Potassium 6.0 (H) 3.5 - 5.1 mmol/L     Comment: Performed at [facility] [address], [place],  [address]  Urinalysis, Routine w reflex microscopic     Status: None   Collection Time: 05/18/18 12:30 PM  Result Value Ref Range   Color, Urine YELLOW YELLOW   APPearance CLEAR CLEAR   Specific Gravity, Urine 1.012 1.005 -  1.030   pH 8.0 5.0 - 8.0   Glucose, UA NEGATIVE NEGATIVE mg/dL   Hgb urine dipstick NEGATIVE NEGATIVE   Bilirubin Urine NEGATIVE NEGATIVE   Ketones, ur NEGATIVE NEGATIVE mg/dL   Protein, ur NEGATIVE NEGATIVE mg/dL   Nitrite NEGATIVE NEGATIVE   Leukocytes, UA NEGATIVE NEGATIVE    Comment: Performed at [facility] [address], [place], [place] [address]  Urine rapid drug screen (hosp performed)     Status: Abnormal   Collection Time: 05/18/18  1:50 PM  Result Value Ref Range   Opiates POSITIVE (A) NONE DETECTED   Cocaine NONE DETECTED NONE DETECTED   Benzodiazepines NONE DETECTED NONE DETECTED   Amphetamines NONE DETECTED NONE DETECTED   Tetrahydrocannabinol NONE DETECTED NONE DETECTED   Barbiturates NONE DETECTED NONE DETECTED    Comment: (NOTE) DRUG SCREEN FOR MEDICAL PURPOSES ONLY.  IF CONFIRMATION IS NEEDED FOR ANY PURPOSE, NOTIFY LAB WITHIN 5 DAYS. LOWEST DETECTABLE LIMITS FOR URINE DRUG SCREEN Drug Class                     Cutoff (ng/mL) Amphetamine and metabolites    1000 Barbiturate and metabolites    200 Benzodiazepine                 191 Tricyclics and metabolites     300 Opiates and metabolites        300 Cocaine and metabolites        300 THC                            50 Performed at [facility], [place] [address], [place], [place] [address]   Basic metabolic panel     Status: Abnormal   Collection Time: 05/18/18  3:27 PM  Result Value Ref Range   Sodium 141 135 - 145 mmol/L   Potassium 6.1 (H) 3.5 - 5.1 mmol/L   Chloride 114 (H) 98 - 111 mmol/L   CO2 20 (L) 22 - 32 mmol/L   Glucose, Bld 100 (H) 70 - 99 mg/dL   BUN 42 (H) 8 - 23 mg/dL    Creatinine, Ser 2.00 (H) 0.61 - 1.24 mg/dL   Calcium 9.1 8.9 - 10.3 mg/dL   GFR calc non Af Amer 34 (L) >60 mL/min   GFR calc Af Amer 40 (L) >60 mL/min   Anion gap 7 5 - 15    Comment: Performed at [facility] [address] [place], [place] [address]  Ethanol     Status: None   Collection Time: 05/18/18  3:27 PM  Result Value Ref Range   Alcohol, Ethyl (B) <10 <10 mg/dL    Comment: (NOTE) Lowest detectable limit for serum alcohol is 10 mg/dL. For medical purposes only. Performed at [facility], [place] [address], [place], [place] [address]    Dg Chest 1 View  Result Date: 05/18/2018 CLINICAL DATA:  63 year old male with altered mental status, combative. EXAM: CHEST  1 VIEW COMPARISON:  05/10/2018 and earlier. FINDINGS: Portable AP upright view at 1139 hours. Right PICC line is in place, tip at the lower SVC level. Normal cardiac size and mediastinal contours. Visualized tracheal air column is within normal limits. Allowing for portable technique the lungs are clear. No pneumothorax. No acute osseous abnormality identified. IMPRESSION: No cardiopulmonary abnormality.  Right PICC line in place. Electronically Signed   By: Herminio Heads.D.  On: 05/18/2018 11:53    Pending Labs Unresulted Labs (From admission, onward)    Start     Ordered   05/19/18 0500  CBC  Tomorrow morning,   R     05/18/18 1315   05/19/18 0500  Comprehensive metabolic panel  Tomorrow morning,   R     05/18/18 1315   05/19/18 0500  Ammonia  Tomorrow morning,   R     05/18/18 1350          Vitals/Pain Today's Vitals   05/18/18 1213 05/18/18 1245 05/18/18 1345 05/18/18 1430  BP:  (!) 141/78 (!) 117/101 (!) 164/55  Pulse:    71  Resp:  14 16 16   Temp: (S) (!) 97.4 F (36.3 C)     TempSrc: (S) Axillary     SpO2:  94% 93% 97%  Weight:      Height:        Isolation Precautions No active isolations  Medications Medications  lactulose (CHRONULAC) enema 200 gm (has no administration in time  range)  ceFAZolin (ANCEF) IVPB (has no administration in time range)  nadolol (CORGARD) tablet 20 mg (20 mg Oral Not Given 05/18/18 1547)  lactulose (CHRONULAC) 10 GM/15ML solution 30 g (30 g Oral Not Given 05/18/18 1548)  pantoprazole (PROTONIX) EC tablet 40 mg (40 mg Oral Not Given 0/99/83 3825)  folic acid (FOLVITE) tablet 1 mg (1 mg Oral Not Given 05/18/18 1549)  thiamine tablet 100 mg (100 mg Oral Not Given 05/18/18 1550)  albuterol (PROVENTIL HFA;VENTOLIN HFA) 108 (90 Base) MCG/ACT inhaler 2 puff (has no administration in time range)  guaiFENesin (MUCINEX) 12 hr tablet 600 mg (has no administration in time range)  enoxaparin (LOVENOX) injection 30 mg (has no administration in time range)  docusate sodium (COLACE) capsule 100 mg (has no administration in time range)  bisacodyl (DULCOLAX) suppository 10 mg (has no administration in time range)  ondansetron (ZOFRAN) tablet 4 mg (has no administration in time range)    Or  ondansetron (ZOFRAN) injection 4 mg (has no administration in time range)  albuterol (PROVENTIL) (2.5 MG/3ML) 0.083% nebulizer solution 2.5 mg (has no administration in time range)  calcium gluconate 1 g/ 50 mL sodium chloride IVPB (has no administration in time range)  lactulose (CHRONULAC) enema 200 gm (has no administration in time range)  LORazepam (ATIVAN) injection 2 mg (has no administration in time range)  LORazepam (ATIVAN) injection 1 mg (1 mg Intravenous Given 05/18/18 1455)    Mobility non-ambulatory Moderate fall risk   Focused Assessments Neuro Assessment Handoff:  Swallow screen pass? No    NIH Stroke Scale ( + Modified Stroke Scale Criteria)  Interval: Initial Level of Consciousness (1a.)   : Not alert, but arousable by minor stimulation to obey, answer, or respond LOC Questions (1b. )   +: Answers neither question correctly LOC Commands (1c. )   + : Performs neither task correctly Best Gaze (2. )  +: Forced deviation Visual (3. )  +: Complete  hemianopia Facial Palsy (4. )    : Normal symmetrical movements Motor Arm, Left (5a. )   +: No drift Motor Arm, Right (5b. )   +: No drift Motor Leg, Left (6a. )   +: No drift Motor Leg, Right (6b. )   +: No drift Limb Ataxia (7. ): Amputation or joint fusion Sensory (8. )   +: Normal, no sensory loss Best Language (9. )   +: Mute, global aphasia(Moans/grunts) Dysarthria (10. ): Intubated  or other physical barrier Extinction/Inattention (11.)   +: No Abnormality Modified SS Total  +: 11 Complete NIHSS TOTAL: 12 Last date known well: 05/18/18 Last time known well: 0200 Neuro Assessment:   Neuro Checks:   Initial (05/18/18 1110)  Last Documented NIHSS Modified Score: 11 (05/18/18 1110) Has TPA been given? No If patient is a Neuro Trauma and patient is going to OR before floor call report to [place] nurse: [phone] or [phone]     Recommendations: See Admitting Provider Note  Report given to:   Additional Notes: Pt has been aggressive with staff, refuses to follow command, unknown what baseline. Family is not present.

## 2018-05-18 NOTE — ED Triage Notes (Addendum)
BIB EMS for C/O AMS and combativeness.  [name] wife reported to EMS, pt becomes agitated and combative when Anomia levels are elevated.  Pt not allowing staff to help at all.   LSN 0200 this AM.

## 2018-05-18 NOTE — ED Notes (Addendum)
Unable to administer medications to pt currently. Pt altered, aggressive, combative, and climbing out of bed with eyes closed.  Notified attending MD lactulose not administered as of yet.

## 2018-05-18 NOTE — Progress Notes (Signed)
Patient never given Chronulac in ED prior to transport to floor. Swallow eval performed and patient failed. Is now NPO. K.Schorr texted to inform and see if alternate route to be used. Awaiting response. Kayexalate enema given with no results.

## 2018-05-18 NOTE — ED Provider Notes (Signed)
[place] [facility] EMERGENCY DEPARTMENT Provider Note   CSN: [id number] Arrival date & time: 05/18/18  1054     History   Chief Complaint Chief Complaint  Patient presents with  . Altered Mental Status  . Aggressive Behavior    HPI Kimball Appleby is a 63 y.o. male.  The history is provided by the patient and medical records. No language interpreter was used.   Martie Fulgham is a 63 y.o. male  with a PMH of HTN, HLD, cirrhosis, prior MI who presents to the Emergency Department for altered mental status and combativeness.  Per EMS, wife reported that patient becomes agitated and combative when his ammonia levels are elevated.  Unfortunately, there is no one at the bedside to help provide any further history.   Level V caveat applies 2/2 AMS  Past Medical History:  Diagnosis Date  . Alcohol abuse   . Anemia   . Arthritis   . Ascites   . Cirrhosis ([place])   . Coffee ground emesis   . Dehydration 06/17/2017  . Febrile illness   . Heart murmur   . Hyperlipidemia   . Hypertension   . Leg swelling   . Myocardial infarction ([place]) 2012  . Preop cardiovascular exam 04/14/2013  . Sepsis ([place]) 06/17/2017  . Septic shock ([place]) 06/18/2017  . SIRS (systemic inflammatory response syndrome) ([place]) 07/11/2017  . Stroke [facility]) 2012   no deficits  . Thrombocytopenia [facility])     Patient Active Problem List   Diagnosis Date Noted  . MSSA bacteremia 05/11/2018  . Edentulous 05/11/2018  . Pancytopenia ([place]) 05/10/2018  . CAD (coronary artery disease) 05/10/2018  . Hepatic encephalopathy ([place]) 05/10/2018  . Alcohol abuse 05/10/2018  . GERD (gastroesophageal reflux disease) 05/10/2018  . Duodenal ulcer   . Renal failure   . Acute on chronic anemia   . Hypotension 06/17/2017  . Acute on chronic renal failure ([place]) 06/17/2017  . Hyponatremia 06/17/2017  . Thrombocytopenia ([place]) 06/17/2017  . Leg edema, right 06/17/2017  . Acute metabolic encephalopathy 11/57/2620    . Anemia, iron deficiency   . Benign neoplasm of ascending colon   . Hemorrhoids   . Portal hypertensive gastropathy ([place])   . Gastritis and gastroduodenitis   . Esophageal varices without bleeding ([place])   . H/O: CVA (cerebrovascular accident) 12/01/2013  . ACS (acute coronary syndrome) ([place]) 12/01/2013  . Anasarca 12/01/2013  . Substance abuse ([place]) 12/01/2013  . CKD (chronic kidney disease) stage 3, GFR 30-59 ml/min ([name]) 12/01/2013  . S/P right TKA 05/02/2013  . Murmur 04/14/2013  . Right inguinal hernia 12/26/2010  . Cirrhosis, alcoholic ([place]) 35/59/7416    Past Surgical History:  Procedure Laterality Date  . COLONOSCOPY WITH PROPOFOL N/A 02/07/2016   Procedure: COLONOSCOPY WITH PROPOFOL;  Surgeon: Milus Banister, MD;  [facility]: [facility] ENDOSCOPY;  Service: Endoscopy;  Laterality: N/A;  . ESOPHAGOGASTRODUODENOSCOPY (EGD) WITH PROPOFOL N/A 02/07/2016   Procedure: ESOPHAGOGASTRODUODENOSCOPY (EGD) WITH PROPOFOL;  Surgeon: Milus Banister, MD;  [facility] ENDOSCOPY;  Service: Endoscopy;  Laterality: N/A;  . ESOPHAGOGASTRODUODENOSCOPY (EGD) WITH PROPOFOL N/A 06/22/2017   Procedure: ESOPHAGOGASTRODUODENOSCOPY (EGD) WITH PROPOFOL;  Surgeon: Jerene Bears, MD;  Location: [facility] ENDOSCOPY;  Service: Gastroenterology;  Laterality: N/A;  . HERNIA REPAIR Right    inguinal  . KNEE ARTHROSCOPY     bilateral/  12/14  . TEE WITHOUT CARDIOVERSION N/A 05/13/2018   Procedure: TRANSESOPHAGEAL ECHOCARDIOGRAM (TEE);  Surgeon: [name], MD;  Location: [facility] ENDOSCOPY;  Service:  Cardiovascular;  Laterality: N/A;  . TOTAL KNEE ARTHROPLASTY Right 05/02/2013   Procedure: RIGHT TOTAL KNEE ARTHROPLASTY;  Surgeon: Mauri Pole, MD;  [facility]: [facility] ORS;  Service: Orthopedics;  Laterality: Right;        Home Medications    Prior to Admission medications   Medication Sig Start Date End Date Taking? Authorizing Provider  albuterol (PROVENTIL HFA;VENTOLIN HFA) 108 (90 Base) MCG/ACT inhaler Inhale 2 puffs  into the lungs every 6 (six) hours as needed for wheezing or shortness of breath. 09/16/17  Yes [name], MD  diphenhydrAMINE-zinc acetate (BENADRYL EXTRA STRENGTH) cream Apply 1 application topically 3 (three) times daily as needed for itching. 04/25/18  Yes Domenic Moras, PA-C  folic acid (FOLVITE) 1 MG tablet TAKE 1 TABLET BY MOUTH EVERY DAY Patient taking differently: Take 1 mg by mouth daily.  03/19/18  Yes [name], MD  furosemide (LASIX) 20 MG tablet TAKE 1 TABLET BY MOUTH TWICE DAILY Patient taking differently: Take 20 mg by mouth 2 (two) times daily.  04/19/18  Yes [name], MD  guaiFENesin (MUCINEX) 600 MG 12 hr tablet Take 1 tablet (600 mg total) by mouth 2 (two) times daily as needed for cough or to loosen phlegm. 07/14/17  Yes Rosita Fire, MD  lactulose (CHRONULAC) 10 GM/15ML solution Take 45 mLs (30 g total) by mouth 2 (two) times daily. 12/03/17  Yes [name], MD  nadolol (CORGARD) 20 MG tablet TAKE 1 TABLET BY MOUTH EVERY DAY Patient taking differently: Take 20 mg by mouth daily.  02/17/18  Yes [name], MD  pantoprazole (PROTONIX) 40 MG tablet TAKE 1 TABLET(40 MG) BY MOUTH DAILY Patient taking differently: Take 40 mg by mouth daily.  03/03/18  Yes [name], MD  silver sulfADIAZINE (SILVADENE) 1 % cream Apply 1 application topically daily as needed. To  Burn 05/14/18  Yes Aline August, MD  spironolactone (ALDACTONE) 50 MG tablet Take 2 tablets (100 mg total) by mouth 2 (two) times daily. 04/19/18  Yes Levin Erp, PA  thiamine 100 MG tablet Take 1 tablet (100 mg total) by mouth daily. 12/04/17  Yes [name], MD  triamcinolone (KENALOG) 0.025 % ointment Apply 1 application topically 2 (two) times daily as needed (rash). 05/14/18  Yes Aline August, MD  ceFAZolin (ANCEF) IVPB Inject 2 g into the vein every 8 (eight) hours for 10 days. Indication:  MSSA bacteremia Last Day of Therapy:  05/24/2018 Labs - Once weekly:   CBC/D and BMP, Labs - Every other week:  ESR and CRP 05/14/18 05/24/18  Aline August, MD    Family History Family History  Problem Relation Age of Onset  . Hypertension Father   . Diabetes Father   . Dementia Mother   . Lupus Sister     Social History Social History   Tobacco Use  . Smoking status: Former Smoker    Packs/day: 0.50    Years: 10.00    Pack years: 5.00    Types: Cigarettes    Last attempt to quit: 01/05/2016    Years since quitting: 2.3  . Smokeless tobacco: Never Used  . Tobacco comment: 4 cigarettes a day  Substance Use Topics  . Alcohol use: No    Comment: 40 oz per week as of 01-19-17  . Drug use: Yes    Types: Marijuana, Heroin    Comment: took about 1 month ago- as of 01-19-17     Allergies   Patient  has no known allergies.   Review of Systems Review of Systems  Unable to perform ROS: Mental status change     Physical Exam Updated Vital Signs BP (!) 141/78   Temp (S) (!) 97.4 F (36.3 C) (Axillary)   Resp 14   Ht '5\' 8"'  (1.727 m)   Wt 62.9 kg   SpO2 94%   BMI 21.08 kg/m   Physical Exam Vitals signs and nursing note reviewed.  Constitutional:      General: He is not in acute distress.    Appearance: He is well-developed.  HENT:     Head: Normocephalic and atraumatic.  Neck:     Musculoskeletal: Neck supple.  Cardiovascular:     Rate and Rhythm: Normal rate and regular rhythm.     Heart sounds: Normal heart sounds. No murmur.  Pulmonary:     Effort: Pulmonary effort is normal. No respiratory distress.  Abdominal:     General: There is no distension.     Palpations: Abdomen is soft.     Tenderness: There is no abdominal tenderness.  Skin:    General: Skin is warm and dry.  Neurological:     Mental Status: He is alert.     Comments: Somnolent, but arousable.      ED Treatments / Results  Labs (all labs ordered are listed, but only abnormal results are displayed) Labs Reviewed  CBC WITH DIFFERENTIAL/PLATELET - Abnormal;  Notable for the following components:      Result Value   WBC 3.9 (*)    RBC 2.22 (*)    Hemoglobin 8.1 (*)    HCT 24.9 (*)    MCV 112.2 (*)    MCH 36.5 (*)    RDW 17.6 (*)    Platelets 84 (*)    Lymphs Abs 0.6 (*)    Eosinophils Absolute 0.6 (*)    All other components within normal limits  COMPREHENSIVE METABOLIC PANEL - Abnormal; Notable for the following components:   Potassium 6.0 (*)    Glucose, Bld 109 (*)    BUN 42 (*)    Creatinine, Ser 2.12 (*)    Albumin 2.9 (*)    Total Bilirubin 1.5 (*)    GFR calc non Af Amer 32 (*)    GFR calc Af Amer 37 (*)    All other components within normal limits  AMMONIA - Abnormal; Notable for the following components:   Ammonia 254 (*)    All other components within normal limits  POTASSIUM - Abnormal; Notable for the following components:   Potassium 6.0 (*)    All other components within normal limits  URINALYSIS, ROUTINE W REFLEX MICROSCOPIC  ETHANOL    EKG EKG Interpretation  Date/Time:  Tuesday May 18 2018 11:02:13 EST Ventricular Rate:  61 PR Interval:    QRS Duration: 82 QT Interval:  452 QTC Calculation: 456 R Axis:   55 Text Interpretation:  Sinus rhythm peaked T waves, more prominent than May 10 2018 Probable anteroseptal infarct, old Confirmed by Sherwood Gambler [phone]) on 05/18/2018 11:46:46 AM Also confirmed by Sherwood Gambler [phone]), editor Philomena Doheny [phone])  on 05/18/2018 11:58:53 AM   Radiology Dg Chest 1 View  Result Date: 05/18/2018 CLINICAL DATA:  63 year old male with altered mental status, combative. EXAM: CHEST  1 VIEW COMPARISON:  05/10/2018 and earlier. FINDINGS: Portable AP upright view at 1139 hours. Right PICC line is in place, tip at the lower SVC level. Normal cardiac size and mediastinal contours. Visualized tracheal air  column is within normal limits. Allowing for portable technique the lungs are clear. No pneumothorax. No acute osseous abnormality identified. IMPRESSION: No cardiopulmonary  abnormality.  Right PICC line in place. Electronically Signed   By: Genevie Ann M.D.   On: 05/18/2018 11:53    Procedures Procedures (including critical care time)  Medications Ordered in ED Medications  lactulose (CHRONULAC) enema 200 gm (has no administration in time range)     Initial Impression / Assessment and Plan / ED Course  I have reviewed the triage vital signs and the nursing notes.  Pertinent labs & imaging results that were available during my care of the patient were reviewed by me and considered in my medical decision making (see chart for details).    Avi Kerschner is a 64 y.o. male who presents to ED for altered mental status.  History of cirrhosis with hepatic encephalopathy in the past.  Per EMS, wife at the scene indicated this is his typical behavior when his ammonia level is high.  Ammonia is indeed high today.  Also has hyperkalemia.  Will give lactulose.  Medicine consulted who will admit.  Patient seen by and discussed with Dr. Regenia Skeeter who agrees with treatment plan.    Final Clinical Impressions(s) / ED Diagnoses   Final diagnoses:  AMS (altered mental status)  Hepatic encephalopathy [facility])  Hyperkalemia    ED Discharge Orders    None       Ward, Ozella Almond, PA-C 05/18/18 [place], MD 05/19/18 1219

## 2018-05-19 ENCOUNTER — Inpatient Hospital Stay (HOSPITAL_COMMUNITY): Payer: BC Managed Care – PPO

## 2018-05-19 DIAGNOSIS — K7030 Alcoholic cirrhosis of liver without ascites: Secondary | ICD-10-CM

## 2018-05-19 DIAGNOSIS — R7881 Bacteremia: Secondary | ICD-10-CM

## 2018-05-19 DIAGNOSIS — E875 Hyperkalemia: Secondary | ICD-10-CM

## 2018-05-19 LAB — COMPREHENSIVE METABOLIC PANEL
ALT: 6 U/L (ref 0–44)
AST: 29 U/L (ref 15–41)
Albumin: 2.5 g/dL — ABNORMAL LOW (ref 3.5–5.0)
Alkaline Phosphatase: 62 U/L (ref 38–126)
Anion gap: 9 (ref 5–15)
BUN: 39 mg/dL — ABNORMAL HIGH (ref 8–23)
CO2: 19 mmol/L — ABNORMAL LOW (ref 22–32)
Calcium: 6.7 mg/dL — ABNORMAL LOW (ref 8.9–10.3)
Chloride: 116 mmol/L — ABNORMAL HIGH (ref 98–111)
Creatinine, Ser: 1.86 mg/dL — ABNORMAL HIGH (ref 0.61–1.24)
GFR calc Af Amer: 43 mL/min — ABNORMAL LOW (ref >60)
GFR calc non Af Amer: 37 mL/min — ABNORMAL LOW (ref >60)
Glucose, Bld: 75 mg/dL (ref 70–99)
Potassium: 6.4 mmol/L (ref 3.5–5.1)
Sodium: 144 mmol/L (ref 135–145)
Total Bilirubin: 1.9 mg/dL — ABNORMAL HIGH (ref 0.3–1.2)
Total Protein: 6.2 g/dL — ABNORMAL LOW (ref 6.5–8.1)

## 2018-05-19 LAB — AMMONIA: Ammonia: 67 umol/L — ABNORMAL HIGH (ref 9–35)

## 2018-05-19 LAB — CBC
HCT: 26.1 % — ABNORMAL LOW (ref 39.0–52.0)
Hemoglobin: 8.4 g/dL — ABNORMAL LOW (ref 13.0–17.0)
MCH: 35 pg — ABNORMAL HIGH (ref 26.0–34.0)
MCHC: 32.2 g/dL (ref 30.0–36.0)
MCV: 108.8 fL — ABNORMAL HIGH (ref 80.0–100.0)
Platelets: 81 10*3/uL — ABNORMAL LOW (ref 150–400)
RBC: 2.4 MIL/uL — ABNORMAL LOW (ref 4.22–5.81)
RDW: 17.6 % — ABNORMAL HIGH (ref 11.5–15.5)
WBC: 3.3 10*3/uL — ABNORMAL LOW (ref 4.0–10.5)
nRBC: 0 % (ref 0.0–0.2)

## 2018-05-19 MED ORDER — SODIUM CHLORIDE 0.9% FLUSH: 10.0000 mL | INTRAVENOUS | Status: DC | PRN

## 2018-05-19 MED ORDER — ORAL CARE MOUTH RINSE
15.0000 mL | Freq: Two times a day (BID) | OROMUCOSAL | Status: DC
  Administered 2018-05-20 – 2018-05-21 (×3): 15 mL via OROMUCOSAL

## 2018-05-19 MED ORDER — SODIUM CHLORIDE 0.9 % IV SOLN
INTRAVENOUS | Status: DC | PRN
  Administered 2018-05-19: 10 mL via INTRAVENOUS

## 2018-05-19 MED ORDER — DEXTROSE-NACL 5-0.45 % IV SOLN
INTRAVENOUS | Status: DC
  Administered 2018-05-19 – 2018-05-20 (×2): via INTRAVENOUS

## 2018-05-19 MED ORDER — SODIUM POLYSTYRENE SULFONATE 15 GM/60ML PO SUSP
60.0000 g | Freq: Once | ORAL | Status: AC
  Administered 2018-05-19: 60 g via ORAL
  Filled 2018-05-19: qty 240

## 2018-05-19 MED ORDER — CALCIUM GLUCONATE-NACL 1-0.675 GM/50ML-% IV SOLN
1.0000 g | Freq: Once | INTRAVENOUS | Status: AC
  Filled 2018-05-19: qty 50

## 2018-05-19 MED ORDER — LORAZEPAM 2 MG/ML IJ SOLN
1.0000 mg | Freq: Once | INTRAMUSCULAR | Status: AC
  Administered 2018-05-19: 1 mg via INTRAVENOUS
  Filled 2018-05-19: qty 1

## 2018-05-19 MED ORDER — SODIUM CHLORIDE 0.9 % IV SOLN
INTRAVENOUS | Status: DC
  Administered 2018-05-19: 12:00:00 via INTRAVENOUS

## 2018-05-19 MED ORDER — SODIUM POLYSTYRENE SULFONATE 15 GM/60ML PO SUSP
60.0000 g | Freq: Once | ORAL | Status: DC
  Filled 2018-05-19: qty 240

## 2018-05-19 NOTE — Progress Notes (Signed)
K.Schorr texted again. Patient CIWA increased from 1 to 6 or greater. Pt not answering any questions except that his last name is Lawyer. Will not follow any requests. BP elevated but question accuracy due to pt noncompliance when attempting to take. IV team unable to assess RUA PICC due to pt not willing to lower arm. Unable to administer Cefazolin. Pt refusing to keep tele leads on. Rapid Response called and made aware of situation.

## 2018-05-19 NOTE — Significant Event (Signed)
Rapid Response Event Note  I have been in communication with bedside RN about patient's need. Patient needed medications ordered throughout the night and RN paged provider on call as well. There were no RRT Interventions indicated for this patient. RRT is aware of patient, should the need arise, please contact RRT for assistance. Patient needed orders for treatment of CIWA related agitation, hyperkalemia, and hypertension.   Time: multiple calls between RN and RR RN.  Mark [name]

## 2018-05-19 NOTE — Evaluation (Signed)
Clinical/Bedside Swallow Evaluation Patient Details  Name: Mark [name] MRN: [id number] Date of Birth: 1955/12/25  Today's Date: 05/19/2018 Time: SLP Start Time (ACUTE ONLY): 1525 SLP Stop Time (ACUTE ONLY): 1535 SLP Time Calculation (min) (ACUTE ONLY): 10 min  Past Medical History:  Past Medical History:  Diagnosis Date  . Alcohol abuse   . Anemia   . Arthritis   . Ascites   . Cirrhosis ([place])   . Coffee ground emesis   . Dehydration 06/17/2017  . Febrile illness   . Heart murmur   . Hyperlipidemia   . Hypertension   . Leg swelling   . Myocardial infarction ([place]) 2012  . Preop cardiovascular exam 04/14/2013  . Sepsis ([place]) 06/17/2017  . Septic shock ([place]) 06/18/2017  . SIRS (systemic inflammatory response syndrome) ([place]) 07/11/2017  . Stroke [facility]) 2012   no deficits  . Thrombocytopenia ([place])    Past Surgical History:  Past Surgical History:  Procedure Laterality Date  . COLONOSCOPY WITH PROPOFOL N/A 02/07/2016   Procedure: COLONOSCOPY WITH PROPOFOL;  Surgeon: Milus Banister, MD;  [facility] ENDOSCOPY;  Service: Endoscopy;  Laterality: N/A;  . ESOPHAGOGASTRODUODENOSCOPY (EGD) WITH PROPOFOL N/A 02/07/2016   Procedure: ESOPHAGOGASTRODUODENOSCOPY (EGD) WITH PROPOFOL;  Surgeon: Milus Banister, MD;  [facility] ENDOSCOPY;  Service: Endoscopy;  Laterality: N/A;  . ESOPHAGOGASTRODUODENOSCOPY (EGD) WITH PROPOFOL N/A 06/22/2017   Procedure: ESOPHAGOGASTRODUODENOSCOPY (EGD) WITH PROPOFOL;  Surgeon: Jerene Bears, MD;  Location: [facility] ENDOSCOPY;  Service: Gastroenterology;  Laterality: N/A;  . HERNIA REPAIR Right    inguinal  . KNEE ARTHROSCOPY     bilateral/  12/14  . TEE WITHOUT CARDIOVERSION N/A 05/13/2018   Procedure: TRANSESOPHAGEAL ECHOCARDIOGRAM (TEE);  Surgeon: [name], MD;  Location: [facility] ENDOSCOPY;  Service: Cardiovascular;  Laterality: N/A;  . TOTAL KNEE ARTHROPLASTY Right 05/02/2013   Procedure: RIGHT TOTAL KNEE ARTHROPLASTY;  Surgeon: Mauri Pole, MD;   [facility] ORS;  Service: Orthopedics;  Laterality: Right;   HPI:  63 y.o. male with a PMH of HTN, HLD, cirrhosis, prior MI, pancytopenia, stroke, polysubstance abuse who presents to the Emergency Department for altered mental status and combativeness.  Dx hepatic encephalopathy, hyperkalemia. Recent admission 05/10/18 for the same.    Assessment / Plan / Recommendation Clinical Impression  Pt with transient dysphagia related to mental status. He was poorly responsive; did not follow commands, open eyes, or verbalize despite maximal efforts to elicit participation.  HOB elevated; wet washcloth applied to face. Pt eventually received water from a spoon, withdrew water from a straw with no overt s/s of aspiration, adequate oral seal.  Was not sufficiently alert for further POs.  Until MS improves, allow ice chips, sips of water, meds crushed in puree or liquid form when alert. SLP will follow for PO readiness. D/W RN.  SLP Visit Diagnosis: Dysphagia, unspecified (R13.10)    Aspiration Risk       Diet Recommendation   sips and chips  Medication Administration: Crushed with puree    Other  Recommendations Oral Care Recommendations: Oral care prior to ice chip/H20   Follow up Recommendations None      Frequency and Duration min 2x/week  1 week       Prognosis Prognosis for Safe Diet Advancement: Good Barriers/Prognosis Comment: MS      Swallow Study   General Date of Onset: 05/18/18 HPI: 63 y.o. male with a PMH of HTN, HLD, cirrhosis, prior MI, pancytopenia, stroke, polysubstance abuse who presents to the Emergency Department for  altered mental status and combativeness.  Dx hepatic encephalopathy, hyperkalemia. Recent admission 05/10/18 for the same.  Type of Study: Bedside Swallow Evaluation Previous Swallow Assessment: no Diet Prior to this Study: NPO Temperature Spikes Noted: No Respiratory Status: Room air History of Recent Intubation: No Behavior/Cognition:  Lethargic/Drowsy Oral Cavity Assessment: (unable to fully assess) Oral Care Completed by SLP: Recent completion by staff Vision: (unknown) Self-Feeding Abilities: Total assist Patient Positioning: Upright in bed Baseline Vocal Quality: Not observed Volitional Cough: Cognitively unable to elicit Volitional Swallow: Unable to elicit    Oral/Motor/Sensory Function Overall Oral Motor/Sensory Function: Other (comment)(symmetric at baseline)   Ice Chips Ice chips: Not tested   Thin Liquid Thin Liquid: Within functional limits Presentation: Spoon;Straw    Nectar Thick Nectar Thick Liquid: Not tested   Honey Thick Honey Thick Liquid: Not tested   Puree Puree: Not tested   Solid     Solid: Not tested      Mark [name] 05/19/2018,3:40 PM   Estill Bamberg L. Tivis Ringer, [place] Office number [phone] Pager [phone]

## 2018-05-19 NOTE — Progress Notes (Signed)
Writer @ bedside to assess PICC per IVT consult. On arrival, primary care RN @ bedside, patient attempting to get out of bed. Pt agitated, not following commands and patient refusing to straighten arm for writer to assess PICC. Per chest xray from today, PICC in lower SVC. Writer attempted to straighten RUE multiple times all unsuccessful as patient forcefully keeping RUE drawn toward chest. Primary care RN awaiting return call from MD for new orders. Instructed to call writer when patient is settled to allow for proper line assessment and possible dressing change.

## 2018-05-19 NOTE — Progress Notes (Signed)
K.Schorr texted again after CIWA re assessed. Pt now a 16. Attempting to climb out of bed when awake,swinging/kicking at staff, threatening to '[place]."  Incontinent small amount of stool. Still unable to access PICC to give antibiotic. Still unable to give Chronulac due to ordered po and patient is npo. Rapid Response notified again also.

## 2018-05-19 NOTE — Progress Notes (Signed)
K.Schorr texted potassium level of 6.4 this am.

## 2018-05-19 NOTE — Progress Notes (Signed)
K.Schoor texted again. Patient more awake. Continues to not let tele be applied. Pulled condom cath off. Swinging @ staff,shouting "leave me alone".

## 2018-05-19 NOTE — Progress Notes (Signed)
Pt Ng was pulled out while radiology was verifying placement, writer attempted to replace NG into left then into right nares unsuccessfully. Writer made provider aware. Awaiting orders, will continue to monitor.

## 2018-05-19 NOTE — Care Management Note (Signed)
Case Management Note Manya Silvas, RN MSN CCM Transitions of Care [address] [place] [phone]  Patient Details  Name: Mark [name] MRN: [id number] Date of Birth: 10/26/1955  Subjective/Objective:    Hepatic encephalopathy        Action/Plan: PTA home with wife and home health services-[name] to administer ancef every 8 hours through [facility] to provide IV ancef. Will continue to follow for transition of care needs.   Expected Discharge Date:  (unknown)               Expected Discharge Plan:  [place]  In-House Referral:     Discharge planning Services  CM Consult  Post Acute Care Choice:    Choice offered to:     DME Arranged:    DME Agency:     HH Arranged:    HH Agency:     Status of Service:  In process, will continue to follow  If discussed at Long Length of Stay Meetings, dates discussed:    Additional Comments:  Bartholomew Crews, RN 05/19/2018, 3:30 PM

## 2018-05-19 NOTE — Progress Notes (Signed)
Soft wrist restraint was placed at 6:50 pm to put NG tube.  Patient tolerated ok.

## 2018-05-19 NOTE — Progress Notes (Signed)
Took 5 people to try [facility] enema, but pt was not cooperative and was pushing out the catheter with gluteus muscles.  Notified Dr. Darrick Meigs.

## 2018-05-19 NOTE — Progress Notes (Signed)
[facility]  PROGRESS NOTE  Datron Brakebill IFO:[id number] DOB: [birth year] DOA: 05/18/2018 PCP: [name], MD   Brief HPI:   63 year old male history of alcoholic liver cirrhosis with ascites, pancytopenia, hypertension,  CAD, polysubstance abuse, CKD stage III who was discharged from the hospital on 05/14/2018 after he was treated for hepatic encephalopathy.  At that time he had a TEE for MSSA bacteremia which showed no vegetation.  Patient was discharged on IV Ancef till 05/24/2018. Patient was brought back to hospital with altered mental status.    Subjective   Patient continues to lethargic, nonverbal.  Ammonia level 67 this morning.  Potassium 6.4.   Assessment/Plan:     1. Hepatic encephalopathy-slowly improving, likely from medication noncompliance.  Continue lactulose 30 g p.o. twice daily.  Ammonia level is down to 67, it was 254 yesterday.  Follow ammonia level in a.m.  2. Hyperkalemia-potassium is 6.4, Aldactone on hold.  Will give calcium gluconate 1 g IV x1.  Kayexalate 60 g PR was ordered, earlier today.  It could not be given as patient was resisting PR dose.  Will change to 60 mg p.o. x1.  Swallow evaluation was done today and patient has been started on ice chips and sips with meds.  Follow BMP in a.m.  3. Alcoholic cirrhosis with portal hypertension with history of gastric varices-continue nadolol, diuretics on hold due to acute kidney injury.  4. MSSA bacteremia-diagnosed in previous admission continue Ancef till 05/24/2018.  5. Acute kidney injury on CKD stage III-likely prerenal, Lasix and Aldactone on hold.  Today creatinine is improved to 1.86.  Follow BMP in a.m.  6. Chronic anemia-secondary to the liver cirrhosis, alcohol abuse.  Follow CBC in a.m.  7. Alcohol abuse-continue CIWA protocol     CBG: No results for input(s): GLUCAP in the last 168 hours.  CBC: Recent Labs  Lab 05/13/18 0310 05/13/18 1139 05/18/18 1105 05/19/18 0408   WBC 8.1 8.6 3.9* 3.3*  NEUTROABS 6.1 6.5 2.2  --   HGB 8.1* 9.1* 8.1* 8.4*  HCT 24.0* 28.6* 24.9* 26.1*  MCV 103.9* 109.6* 112.2* 108.8*  PLT 95* 89* 84* 81*    Basic Metabolic Panel: Recent Labs  Lab 05/13/18 0310 05/14/18 0435 05/18/18 1105 05/18/18 1211 05/18/18 1527 05/19/18 0408  NA 140 140 139  --  141 144  K 3.8 3.8 6.0* 6.0* 6.1* 6.4*  CL 115* 113* 110  --  114* 116*  CO2 16* 18* 23  --  20* 19*  GLUCOSE 97 98 109*  --  100* 75  BUN 13 14 42*  --  42* 39*  CREATININE 1.35* 1.15 2.12*  --  2.00* 1.86*  CALCIUM 8.5* 8.4* 9.1  --  9.1 6.7*  MG 1.6* 1.7  --   --   --   --      DVT prophylaxis: Lovenox  Code Status: Full code  Family Communication: No family at bedside  Disposition Plan: likely home when medically ready for discharge   Consultants:    Procedures:     Antibiotics:   Anti-infectives (From admission, onward)   Start     Dose/Rate Route Frequency Ordered Stop   05/18/18 2200  ceFAZolin (ANCEF) IVPB 2g/100 mL premix     2 g 200 mL/hr over 30 Minutes Intravenous Every 8 hours 05/18/18 1929 05/25/18 0559   05/18/18 1400  ceFAZolin (ANCEF) IVPB  Status:  Discontinued    Note to Pharmacy:  Indication:  MSSA bacteremia Last Day of  Therapy:  05/24/2018 Labs - Once weekly:  CBC/D and BMP, Labs - Every other week:  ESR and CRP     2 g Intravenous Every 8 hours 05/18/18 1315 05/18/18 1927       Objective   Vitals:   05/18/18 2026 05/18/18 2323 05/19/18 0545 05/19/18 1022  BP: (!) 207/154 (!) 150/121 (!) 151/75 130/82  Pulse: 87 86 83 87  Resp: (!) _0 Temp: 98.4 F (36.9 C)  98.1 F (36.7 C) 97.8 F (36.6 C)  TempSrc:   Oral Oral  SpO2:   95% 100%  Weight: 62.7 kg     Height:        Intake/Output Summary (Last 24 hours) at 05/19/2018 1641 Last data filed at 05/19/2018 1300 Gross per 24 hour  Intake 100 ml  Output 500 ml  Net -400 ml   Filed Weights   05/18/18 1100 05/18/18 2026  Weight: 62.9 kg 62.7 kg      Physical Examination:    General: Appears lethargic  Cardiovascular: S1-S2, regular, no murmur auscultated  Respiratory: Clear to auscultation bilaterally  Abdomen: Soft, nontender, no organomegaly  Extremities: No edema in the lower extremities  Neurologic: Lethargic, but arousable     Data Reviewed: I have personally reviewed following labs and imaging studies   Recent Results (from the past 240 hour(s))  Culture, blood (Routine X 2) w Reflex to ID Panel     Status: Abnormal   Collection Time: 05/10/18  4:52 AM  Result Value Ref Range Status   Specimen Description BLOOD RIGHT ANTECUBITAL  Final   Special Requests   Final    BOTTLES DRAWN AEROBIC AND ANAEROBIC Blood Culture adequate volume   Culture  Setup Time   Final    IN BOTH AEROBIC AND ANAEROBIC BOTTLES GRAM POSITIVE COCCI CRITICAL RESULT CALLED TO, READ BACK BY AND VERIFIED WITHRito Ehrlich PHARMD 05/10/18 2054 JDW Performed at [facility], [address] [place], [place] [address]    Culture STAPHYLOCOCCUS AUREUS (A)  Final   Report Status 05/12/2018 FINAL  Final   Organism ID, Bacteria STAPHYLOCOCCUS AUREUS  Final      Susceptibility   Staphylococcus aureus - MIC*    CIPROFLOXACIN <=0.5 SENSITIVE Sensitive     ERYTHROMYCIN <=0.25 SENSITIVE Sensitive     GENTAMICIN <=0.5 SENSITIVE Sensitive     OXACILLIN 0.5 SENSITIVE Sensitive     TETRACYCLINE <=1 SENSITIVE Sensitive     VANCOMYCIN <=0.5 SENSITIVE Sensitive     TRIMETH/SULFA <=10 SENSITIVE Sensitive     CLINDAMYCIN <=0.25 SENSITIVE Sensitive     RIFAMPIN <=0.5 SENSITIVE Sensitive     Inducible Clindamycin NEGATIVE Sensitive     * STAPHYLOCOCCUS AUREUS  Culture, blood (Routine X 2) w Reflex to ID Panel     Status: Abnormal   Collection Time: 05/10/18  4:52 AM  Result Value Ref Range Status   Specimen Description BLOOD RIGHT ANTECUBITAL  Final   Special Requests   Final    BOTTLES DRAWN AEROBIC AND ANAEROBIC Blood Culture adequate volume    Culture  Setup Time   Final    IN BOTH AEROBIC AND ANAEROBIC BOTTLES GRAM POSITIVE COCCI CRITICAL VALUE NOTED.  VALUE IS CONSISTENT WITH PREVIOUSLY REPORTED AND CALLED VALUE.    Culture (A)  Final    STAPHYLOCOCCUS AUREUS SUSCEPTIBILITIES PERFORMED ON PREVIOUS CULTURE WITHIN THE LAST 5 DAYS. Performed at [facility], [place] [address], [place], [place] [address]    Report  Status 05/12/2018 FINAL  Final  Blood Culture ID Panel (Reflexed)     Status: Abnormal   Collection Time: 05/10/18  4:52 AM  Result Value Ref Range Status   Enterococcus species NOT DETECTED NOT DETECTED Final   Listeria monocytogenes NOT DETECTED NOT DETECTED Final   Staphylococcus species DETECTED (A) NOT DETECTED Final    Comment: CRITICAL RESULT CALLED TO, READ BACK BY AND VERIFIED WITH: T PANGDANG PHARMD 05/10/18 2054 JDW    Staphylococcus aureus (BCID) DETECTED (A) NOT DETECTED Final    Comment: Methicillin (oxacillin) susceptible Staphylococcus aureus (MSSA). Preferred therapy is anti staphylococcal beta lactam antibiotic (Cefazolin or Nafcillin), unless clinically contraindicated. CRITICAL RESULT CALLED TO, READ BACK BY AND VERIFIED WITH: T PANGDANG PHARMD 05/10/18 JDW    Methicillin resistance NOT DETECTED NOT DETECTED Final   Streptococcus species NOT DETECTED NOT DETECTED Final   Streptococcus agalactiae NOT DETECTED NOT DETECTED Final   Streptococcus pneumoniae NOT DETECTED NOT DETECTED Final   Streptococcus pyogenes NOT DETECTED NOT DETECTED Final   Acinetobacter baumannii NOT DETECTED NOT DETECTED Final   Enterobacteriaceae species NOT DETECTED NOT DETECTED Final   Enterobacter cloacae complex NOT DETECTED NOT DETECTED Final   Escherichia coli NOT DETECTED NOT DETECTED Final   Klebsiella oxytoca NOT DETECTED NOT DETECTED Final   Klebsiella pneumoniae NOT DETECTED NOT DETECTED Final   Proteus species NOT DETECTED NOT DETECTED Final   Serratia marcescens NOT DETECTED NOT DETECTED Final    Haemophilus influenzae NOT DETECTED NOT DETECTED Final   Neisseria meningitidis NOT DETECTED NOT DETECTED Final   Pseudomonas aeruginosa NOT DETECTED NOT DETECTED Final   Candida albicans NOT DETECTED NOT DETECTED Final   Candida glabrata NOT DETECTED NOT DETECTED Final   Candida krusei NOT DETECTED NOT DETECTED Final   Candida parapsilosis NOT DETECTED NOT DETECTED Final   Candida tropicalis NOT DETECTED NOT DETECTED Final    Comment: Performed at [facility], [place] [address] [place], [place] [address]  MRSA PCR Screening     Status: None   Collection Time: 05/10/18 10:21 AM  Result Value Ref Range Status   MRSA by PCR NEGATIVE NEGATIVE Final    Comment:        The GeneXpert MRSA Assay (FDA approved for NASAL specimens only), is one component of a comprehensive MRSA colonization surveillance program. It is not intended to diagnose MRSA infection nor to guide or monitor treatment for MRSA infections. Performed at [facility], [place] [address], [place], [place] [address]   Culture, blood (routine x 2)     Status: None   Collection Time: 05/11/18  3:13 PM  Result Value Ref Range Status   Specimen Description BLOOD LEFT HAND  Final   Special Requests   Final    BOTTLES DRAWN AEROBIC ONLY Blood Culture adequate volume   Culture   Final    NO GROWTH 5 DAYS Performed at [facility], [place] [address], [place], [place] [address]    Report Status 05/16/2018 FINAL  Final  Culture, blood (routine x 2)     Status: None   Collection Time: 05/11/18  4:00 PM  Result Value Ref Range Status   Specimen Description BLOOD LEFT HAND  Final   Special Requests   Final    BOTTLES DRAWN AEROBIC ONLY Blood Culture adequate volume   Culture   Final    NO GROWTH 5 DAYS Performed at [facility], [place] [address], [place], [place] [address]  Report Status 05/16/2018 FINAL  Final     Liver Function Tests: Recent Labs  Lab 05/13/18 0310 05/14/18 0435 05/18/18 1105  05/19/18 0408  AST 31 39 34 29  ALT _0 ALKPHOS 61 74 103 62  BILITOT 1.1 1.0 1.5* 1.9*  PROT 6.4* 6.0* 6.9 6.2*  ALBUMIN 2.6* 2.4* 2.9* 2.5*   No results for input(s): LIPASE, AMYLASE in the last 168 hours. Recent Labs  Lab 05/18/18 1100 05/19/18 0422  AMMONIA 254* 67*    Cardiac Enzymes: No results for input(s): CKTOTAL, CKMB, CKMBINDEX, TROPONINI in the last 168 hours. BNP (last 3 results) No results for input(s): BNP in the last 8760 hours.  ProBNP (last 3 results) No results for input(s): PROBNP in the last 8760 hours.    Studies: Dg Chest 1 View  Result Date: 05/18/2018 CLINICAL DATA:  64 year old male with altered mental status, combative. EXAM: CHEST  1 VIEW COMPARISON:  05/10/2018 and earlier. FINDINGS: Portable AP upright view at 1139 hours. Right PICC line is in place, tip at the lower SVC level. Normal cardiac size and mediastinal contours. Visualized tracheal air column is within normal limits. Allowing for portable technique the lungs are clear. No pneumothorax. No acute osseous abnormality identified. IMPRESSION: No cardiopulmonary abnormality.  Right PICC line in place. Electronically Signed   By: Genevie Ann M.D.   On: 05/18/2018 11:53    Scheduled Meds: . albuterol  2.5 mg Nebulization Once  . enoxaparin (LOVENOX) injection  30 mg Subcutaneous Q24H  . folic acid  1 mg Oral Daily  . lactulose  30 g Oral BID  . lactulose  300 mL Rectal Once  . lactulose  300 mL Rectal Once  . mouth rinse  15 mL Mouth Rinse BID  . nadolol  20 mg Oral Daily  . pantoprazole  40 mg Oral Daily  . sodium polystyrene  60 g Oral Once  . thiamine  100 mg Oral Daily    Admission status: Inpatient: Based on patients clinical presentation and evaluation of above clinical data, I have made determination that patient meets Inpatient criteria at this time.  Patient with hepatic encephalopathy, started on lactulose.  Also has hyperkalemia  Time spent: 30 minutes  [facility] Pager [phone]. If 7PM-7AM, please contact night-coverage at [email or web address], Office  [phone]  password [email or web address]  05/19/2018, 4:41 PM  LOS: 1 day

## 2018-05-19 NOTE — Progress Notes (Signed)
CIWA score is 6, pt is resting.

## 2018-05-20 DIAGNOSIS — N183 Chronic kidney disease, stage 3 (moderate): Secondary | ICD-10-CM

## 2018-05-20 LAB — COMPREHENSIVE METABOLIC PANEL
ALT: 5 U/L (ref 0–44)
AST: 29 U/L (ref 15–41)
Albumin: 2.6 g/dL — ABNORMAL LOW (ref 3.5–5.0)
Alkaline Phosphatase: 55 U/L (ref 38–126)
Anion gap: 7 (ref 5–15)
BUN: 32 mg/dL — ABNORMAL HIGH (ref 8–23)
CO2: 19 mmol/L — ABNORMAL LOW (ref 22–32)
Calcium: 8.4 mg/dL — ABNORMAL LOW (ref 8.9–10.3)
Chloride: 120 mmol/L — ABNORMAL HIGH (ref 98–111)
Creatinine, Ser: 1.51 mg/dL — ABNORMAL HIGH (ref 0.61–1.24)
GFR calc Af Amer: 56 mL/min — ABNORMAL LOW (ref >60)
GFR calc non Af Amer: 48 mL/min — ABNORMAL LOW (ref >60)
Glucose, Bld: 92 mg/dL (ref 70–99)
Potassium: 4.2 mmol/L (ref 3.5–5.1)
Sodium: 146 mmol/L — ABNORMAL HIGH (ref 135–145)
Total Bilirubin: 1.7 mg/dL — ABNORMAL HIGH (ref 0.3–1.2)
Total Protein: 6.4 g/dL — ABNORMAL LOW (ref 6.5–8.1)

## 2018-05-20 LAB — CBC
HCT: 24.9 % — ABNORMAL LOW (ref 39.0–52.0)
Hemoglobin: 7.9 g/dL — ABNORMAL LOW (ref 13.0–17.0)
MCH: 35.3 pg — ABNORMAL HIGH (ref 26.0–34.0)
MCHC: 31.7 g/dL (ref 30.0–36.0)
MCV: 111.2 fL — ABNORMAL HIGH (ref 80.0–100.0)
Platelets: 104 10*3/uL — ABNORMAL LOW (ref 150–400)
RBC: 2.24 MIL/uL — ABNORMAL LOW (ref 4.22–5.81)
RDW: 18 % — ABNORMAL HIGH (ref 11.5–15.5)
WBC: 3.7 10*3/uL — ABNORMAL LOW (ref 4.0–10.5)
nRBC: 0 % (ref 0.0–0.2)

## 2018-05-20 MED ORDER — ENOXAPARIN SODIUM 40 MG/0.4ML ~~LOC~~ SOLN
40.0000 mg | SUBCUTANEOUS | Status: DC
  Administered 2018-05-21: 40 mg via SUBCUTANEOUS
  Filled 2018-05-20: qty 0.4

## 2018-05-20 NOTE — Progress Notes (Signed)
  Speech Language Pathology Treatment: Dysphagia  Patient Details Name: Mark [name] MRN: [id number] DOB: [birth year] Today's Date: 05/20/2018 Time: 5110-2111 SLP Time Calculation (min) (ACUTE ONLY): 23 min  Assessment / Plan / Recommendation Clinical Impression  Pt with improved mental status; today he is alert, talkative, restless and confused but able to participate in re-assessment.  Demonstrated adequate recognition/anticipation of approaching bolus material; functional mastication; the appearance of a brisk swallow response; no s/s of aspiration with successive sips of water from a cup.  Recommend resuming a PO diet - mechanical soft/dysphagia 3, thin liquids.  Give meds whole in liquid.  No further f/u needed. No further swallowing difficulty.    HPI HPI: 63 y.o. male with a PMH of HTN, HLD, cirrhosis, prior MI, pancytopenia, stroke, polysubstance abuse who presents to the Emergency Department for altered mental status and combativeness.  Dx hepatic encephalopathy, hyperkalemia. Recent admission 05/10/18 for the same.       SLP Plan  Discharge SLP treatment due to (comment)       Recommendations  Diet recommendations: Dysphagia 3 (mechanical soft);Thin liquid Liquids provided via: Cup;Straw Medication Administration: Whole meds with liquid Supervision: Patient able to self feed                Oral Care Recommendations: Oral care BID Follow up Recommendations: None SLP Visit Diagnosis: Dysphagia, unspecified (R13.10) Plan: Discharge SLP treatment due to (comment)       GO                Mark [name] 05/20/2018, 1:32 PM  Mark [name] Ringer, [place] Office number [phone] Pager [phone]

## 2018-05-20 NOTE — Progress Notes (Signed)
Patient continuously took off telemetry all night.

## 2018-05-20 NOTE — Progress Notes (Signed)
[facility]  PROGRESS NOTE  Harrel Ferrone VEL:[id number] DOB: 12/26/1955 DOA: 05/18/2018 PCP: [name], MD   Brief HPI:   63 year old male history of alcoholic liver cirrhosis with ascites, pancytopenia, hypertension,  CAD, polysubstance abuse, CKD stage III who was discharged from the hospital on 05/14/2018 after he was treated for hepatic encephalopathy.  At that time he had a TEE for MSSA bacteremia which showed no vegetation.  Patient was discharged on IV Ancef till 05/24/2018. Patient was brought back to hospital with altered mental status.    Subjective   Patient seen and examined, more alert today.  Pulled out NG tube last night.  Potassium is down to 4.2 this morning   Assessment/Plan:     1. Hepatic encephalopathy-slowly improving, likely from medication noncompliance.  Continue lactulose 30 g p.o. twice daily.  Ammonia level is down to 67, it was 254 yesterday.  Will recheck ammonia level in a.m.  2. Hyperkalemia -resolved, potassium this morning is 4.2.  3. Alcoholic cirrhosis with portal hypertension with history of gastric varices-continue nadolol, diuretics on hold due to acute kidney injury.  4. MSSA bacteremia-diagnosed in previous admission continue Ancef till 05/24/2018.  5. Acute kidney injury on CKD stage III-likely prerenal, Lasix and Aldactone on hold.  Today creatinine is improved to 1.51.  Follow BMP in a.m.  6. Chronic anemia-secondary to the liver cirrhosis, alcohol abuse.  Follow CBC in a.m.  7. Alcohol abuse-continue CIWA protocol     CBG: No results for input(s): GLUCAP in the last 168 hours.  CBC: Recent Labs  Lab 05/13/18 1139 05/18/18 1105 05/19/18 0408 05/20/18 0408  WBC 8.6 3.9* 3.3* 3.7*  NEUTROABS 6.5 2.2  --   --   HGB 9.1* 8.1* 8.4* 7.9*  HCT 28.6* 24.9* 26.1* 24.9*  MCV 109.6* 112.2* 108.8* 111.2*  PLT 89* 84* 81* 104*    Basic Metabolic Panel: Recent Labs  Lab 05/14/18 0435 05/18/18 1105 05/18/18 1211  05/18/18 1527 05/19/18 0408 05/20/18 0408  NA 140 139  --  141 144 146*  K 3.8 6.0* 6.0* 6.1* 6.4* 4.2  CL 113* 110  --  114* 116* 120*  CO2 18* 23  --  20* 19* 19*  GLUCOSE 98 109*  --  100* 75 92  BUN 14 42*  --  42* 39* 32*  CREATININE 1.15 2.12*  --  2.00* 1.86* 1.51*  CALCIUM 8.4* 9.1  --  9.1 6.7* 8.4*  MG 1.7  --   --   --   --   --      DVT prophylaxis: Lovenox  Code Status: Full code  Family Communication: No family at bedside  Disposition Plan: likely home when medically ready for discharge   Consultants:    Procedures:     Antibiotics:   Anti-infectives (From admission, onward)   Start     Dose/Rate Route Frequency Ordered Stop   05/18/18 2200  ceFAZolin (ANCEF) IVPB 2g/100 mL premix     2 g 200 mL/hr over 30 Minutes Intravenous Every 8 hours 05/18/18 1929 05/25/18 0559   05/18/18 1400  ceFAZolin (ANCEF) IVPB  Status:  Discontinued    Note to Pharmacy:  Indication:  MSSA bacteremia Last Day of Therapy:  05/24/2018 Labs - Once weekly:  CBC/D and BMP, Labs - Every other week:  ESR and CRP     2 g Intravenous Every 8 hours 05/18/18 1315 05/18/18 1927       Objective   Vitals:   05/20/18  0003 05/20/18 0204 05/20/18 0416 05/20/18 0845  BP:    (!) 151/60  Pulse: 80 75 73 66  Resp:    16  Temp:    98.4 F (36.9 C)  TempSrc:    Oral  SpO2:    100%  Weight:      Height:        Intake/Output Summary (Last 24 hours) at 05/20/2018 1026 Last data filed at 05/20/2018 0900 Gross per 24 hour  Intake 600 ml  Output 0 ml  Net 600 ml   Filed Weights   05/18/18 1100 05/18/18 2026  Weight: 62.9 kg 62.7 kg     Physical Examination:    General: Appears in no acute distress, alert and more cooperative this morning Cardiovascular: S1-S2, regular, no murmur auscultated Respiratory: Clear to auscultation bilaterally Abdomen: Soft, nontender, no organomegaly Musculoskeletal: No edema of the lower extremities      Data Reviewed: I have  personally reviewed following labs and imaging studies   Recent Results (from the past 240 hour(s))  Culture, blood (routine x 2)     Status: None   Collection Time: 05/11/18  3:13 PM  Result Value Ref Range Status   Specimen Description BLOOD LEFT HAND  Final   Special Requests   Final    BOTTLES DRAWN AEROBIC ONLY Blood Culture adequate volume   Culture   Final    NO GROWTH 5 DAYS Performed at [facility], [address] [place], [place] [address]    Report Status 05/16/2018 FINAL  Final  Culture, blood (routine x 2)     Status: None   Collection Time: 05/11/18  4:00 PM  Result Value Ref Range Status   Specimen Description BLOOD LEFT HAND  Final   Special Requests   Final    BOTTLES DRAWN AEROBIC ONLY Blood Culture adequate volume   Culture   Final    NO GROWTH 5 DAYS Performed at [facility], [place] [address], [place],  [address]    Report Status 05/16/2018 FINAL  Final     Liver Function Tests: Recent Labs  Lab 05/14/18 0435 05/18/18 1105 05/19/18 0408 05/20/18 0408  AST 39 34 29 29  ALT '13 5 6 5  ' ALKPHOS 74 103 62 55  BILITOT 1.0 1.5* 1.9* 1.7*  PROT 6.0* 6.9 6.2* 6.4*  ALBUMIN 2.4* 2.9* 2.5* 2.6*   No results for input(s): LIPASE, AMYLASE in the last 168 hours. Recent Labs  Lab 05/18/18 1100 05/19/18 0422  AMMONIA 254* 67*    Cardiac Enzymes: No results for input(s): CKTOTAL, CKMB, CKMBINDEX, TROPONINI in the last 168 hours. BNP (last 3 results) No results for input(s): BNP in the last 8760 hours.  ProBNP (last 3 results) No results for input(s): PROBNP in the last 8760 hours.    Studies: Dg Chest 1 View  Result Date: 05/18/2018 CLINICAL DATA:  63 year old male with altered mental status, combative. EXAM: CHEST  1 VIEW COMPARISON:  05/10/2018 and earlier. FINDINGS: Portable AP upright view at 1139 hours. Right PICC line is in place, tip at the lower SVC level. Normal cardiac size and mediastinal contours. Visualized tracheal air  column is within normal limits. Allowing for portable technique the lungs are clear. No pneumothorax. No acute osseous abnormality identified. IMPRESSION: No cardiopulmonary abnormality.  Right PICC line in place. Electronically Signed   By: Genevie Ann M.D.   On: 05/18/2018 11:53   Dg Abd Portable 1v  Result Date: 05/19/2018 CLINICAL DATA:  Evaluate NG tube EXAM: PORTABLE ABDOMEN - 1 VIEW COMPARISON:  None. FINDINGS: The NG tube terminates over the upper chest. It projects over the left lateral aspect of the trachea. It is unclear whether this tube is in the airway or the upper esophagus. No other acute abnormalities. IMPRESSION: 1. The NG tube terminates over the upper chest, either within the trachea or upper esophagus. Findings called to the patient's nurse, Belenda Cruise. By report, the NG tube has been removed. Electronically Signed   By: Dorise Bullion III M.D   On: 05/19/2018 20:19    Scheduled Meds: . albuterol  2.5 mg Nebulization Once  . enoxaparin (LOVENOX) injection  30 mg Subcutaneous Q24H  . folic acid  1 mg Oral Daily  . lactulose  30 g Oral BID  . lactulose  300 mL Rectal Once  . lactulose  300 mL Rectal Once  . mouth rinse  15 mL Mouth Rinse BID  . nadolol  20 mg Oral Daily  . pantoprazole  40 mg Oral Daily  . thiamine  100 mg Oral Daily    Admission status: Inpatient: Based on patients clinical presentation and evaluation of above clinical data, I have made determination that patient meets Inpatient criteria at this time.  Patient with hepatic encephalopathy, started on lactulose.    Time spent: 30 minutes  [facility] Pager [phone]. If 7PM-7AM, please contact night-coverage at [email or web address], Office  [phone]  password [email or web address]  05/20/2018, 10:26 AM  LOS: 2 days

## 2018-05-21 LAB — BASIC METABOLIC PANEL
Anion gap: 6 (ref 5–15)
BUN: 20 mg/dL (ref 8–23)
CO2: 19 mmol/L — ABNORMAL LOW (ref 22–32)
Calcium: 8.4 mg/dL — ABNORMAL LOW (ref 8.9–10.3)
Chloride: 125 mmol/L — ABNORMAL HIGH (ref 98–111)
Creatinine, Ser: 1.28 mg/dL — ABNORMAL HIGH (ref 0.61–1.24)
GFR calc Af Amer: >60 mL/min (ref >60)
GFR calc non Af Amer: 59 mL/min — ABNORMAL LOW (ref >60)
Glucose, Bld: 117 mg/dL — ABNORMAL HIGH (ref 70–99)
Potassium: 3.4 mmol/L — ABNORMAL LOW (ref 3.5–5.1)
Sodium: 150 mmol/L — ABNORMAL HIGH (ref 135–145)

## 2018-05-21 LAB — AMMONIA: Ammonia: 55 umol/L — ABNORMAL HIGH (ref 9–35)

## 2018-05-21 MED ORDER — SPIRONOLACTONE 50 MG PO TABS: 50.0000 mg | ORAL_TABLET | Freq: Two times a day (BID) | ORAL | 0 refills | Status: DC

## 2018-05-21 MED ORDER — POTASSIUM CHLORIDE CRYS ER 20 MEQ PO TBCR
20.0000 meq | EXTENDED_RELEASE_TABLET | Freq: Once | ORAL | Status: AC
  Administered 2018-05-21: 20 meq via ORAL
  Filled 2018-05-21: qty 1

## 2018-05-21 MED ORDER — HEPARIN SOD (PORK) LOCK FLUSH 100 UNIT/ML IV SOLN
250.0000 [IU] | INTRAVENOUS | Status: AC | PRN
  Administered 2018-05-21: 250 [IU]

## 2018-05-21 NOTE — Progress Notes (Signed)
Attempted to call family member twice daughter and spouse no answer patient is ready to be discharged will continue to try to make contact. Left a message for spouse.

## 2018-05-21 NOTE — Care Management Note (Signed)
Case Management Note Manya Silvas, RN MSN CCM Transitions of Care [address] [place] [phone]  Patient Details  Name: Mark [name] MRN: [id number] Date of Birth: Mar 14, 1956  Subjective/Objective:    Hepatic encephalopathy        Action/Plan: PTA home with wife and home health services-[name] to administer ancef every 8 hours through [facility] to provide IV ancef. Will continue to follow for transition of care needs.   Expected Discharge Date:  05/21/18               Expected Discharge Plan:  [place]  In-House Referral:     Discharge planning Services  CM Consult  Post Acute Care Choice:  NA Choice offered to:  NA  DME Arranged:  N/A DME Agency:  NA  HH Arranged:  NA HH Agency:  NA  Status of Service:  Completed, signed off  If discussed at [place] of Stay Meetings, dates discussed:    Additional Comments: 05/21/2018-spoke with [name] at [facility] to discuss resumption of [name] services for RN to administer IV antibiotic via PICC line. [name] hesitant about resumption of care d/t pt behavior at last visit where pt became aggressive toward [name] and required the assistance of 2 paramedics and 2 officers plus a family member to escort to the ambulance for transport to the hospital. Pt had previously admitted to [name] that he had used IV drugs a week before previous hospitalization. HHRN did state that the PICC line did not appear to have been tampered. [name] discussed concerns with Pam, [facility] infusion liason who recommended discussing concerns with attending and possible ID consult. Given patient history there concerns for patient safety if patient transition home with PICC. Concerns discussed with Dr. Darrick Meigs with discussion for possible ID consult to recommend alternative antibiotic course that would allow removal of PICC prior to transition home. Received call back from Dr. Darrick Meigs to advise to have PICC be removed prior to transition home. No further antibiotic needs at this  time. Bedside RN aware. No other transition of care needs identified at this time.   Bartholomew Crews, RN 05/21/2018, 3:28 PM

## 2018-05-21 NOTE — Discharge Instructions (Signed)
Mechanical soft diet

## 2018-05-21 NOTE — Discharge Summary (Addendum)
Physician Discharge Summary  Mark [name] NFA:[id number] DOB: [birth year] DOA: 05/18/2018  PCP: [name], MD  Admit date: 05/18/2018 Discharge date: 05/21/2018  Time spent: 50 minutes  Recommendations for Outpatient Follow-up:  1. Follow-up PCP in 1 week 2. Continue IV cefazolin as prescribed, stop date 05/24/2018.   Discharge Diagnoses:  Principal Problem:   Hepatic encephalopathy ([place]) Active Problems:   Cirrhosis, alcoholic ([place])   H/O: CVA (cerebrovascular accident)   Substance abuse ([place])   CKD (chronic kidney disease) stage 3, GFR 30-59 ml/min ([name])   Anemia, iron deficiency   Alcohol abuse   MSSA bacteremia   Hyperkalemia   Discharge Condition: Stable  Diet recommendation: Low-salt diet/mechanical soft diet  Filed Weights   05/18/18 1100 05/18/18 2026 05/20/18 2006  Weight: 62.9 kg 62.7 kg 57.4 kg    History of present illness:  63 year old male history of alcoholic liver cirrhosis with ascites, pancytopenia, hypertension,  CAD, polysubstance abuse, CKD stage III who was discharged from the hospital on 05/14/2018 after he was treated for hepatic encephalopathy.  At that time he had a TEE for MSSA bacteremia which showed no vegetation.  Patient was discharged on IV Ancef till 05/24/2018. Patient was brought back to hospital with altered mental status.  Hospital Course:   1. Hepatic encephalopathy-slowly improving, likely from medication noncompliance.  Continue lactulose 30 g p.o. twice daily.  Ammonia level is down to 55, it was 254 on the day of admission yesterday. .  2. Hyperkalemia -resolved, potassium is 3.4 today.  We will replace potassium with 20 mEq K. Dur x1 before discharge.  3. Alcoholic cirrhosis with portal hypertension with history of gastric varices-continue nadolol, will restart Lasix 20 mg p.o. twice daily, cut down the dose of Aldactone to 50 mg p.o. twice daily due to hyperkalemia as above.  4. MSSA bacteremia-diagnosed in  previous admission patient was getting IV Ancef with stop date till 05/24/2018.  Case management called at home health does not feel comfortable going to patient's home.  Also patient has a previous history of IV drug use.  I called and discussed with infectious disease on-call Dr.Comer, who feels that antibiotics can be discontinued at this time.  Will discontinue IV Ancef and remove PICC line before discharge.  5. Acute kidney injury on CKD stage III-likely prerenal, Lasix and Aldactone on hold.  Today creatinine is improved to 1.28.    6. Chronic anemia-secondary to the liver cirrhosis, alcohol abuse.    7.  Alcohol abuse-patient was started on CIWA protocol, no signs and symptoms of alcohol withdrawal.  Procedures:    Consultations:    Discharge Exam: Vitals:   05/21/18 0543 05/21/18 0833  BP: 126/88 (!) 110/53  Pulse: 80 79  Resp:  19  Temp:  99.4 F (37.4 C)  SpO2:  100%    General: Appears calm and comfortable Cardiovascular: S1-S2, regular Respiratory: No respiratory distress, clear to auscultation bilaterally  Discharge Instructions   Discharge Instructions    Diet - low sodium heart healthy   Complete by:  As directed    Increase activity slowly   Complete by:  As directed      Allergies as of 05/21/2018   No Known Allergies     Medication List    STOP taking these medications   ceFAZolin  IVPB Commonly known as:  ANCEF     TAKE these medications   albuterol 108 (90 Base) MCG/ACT inhaler Commonly known as:  PROVENTIL HFA;VENTOLIN HFA Inhale 2 puffs into  the lungs every 6 (six) hours as needed for wheezing or shortness of breath.   diphenhydrAMINE-zinc acetate cream Commonly known as:  BENADRYL EXTRA STRENGTH Apply 1 application topically 3 (three) times daily as needed for itching.   folic acid 1 MG tablet Commonly known as:  FOLVITE TAKE 1 TABLET BY MOUTH EVERY DAY What changed:    how much to take  how to take this  when to take  this  additional instructions   furosemide 20 MG tablet Commonly known as:  LASIX TAKE 1 TABLET BY MOUTH TWICE DAILY What changed:  when to take this   guaiFENesin 600 MG 12 hr tablet Commonly known as:  MUCINEX Take 1 tablet (600 mg total) by mouth 2 (two) times daily as needed for cough or to loosen phlegm.   lactulose 10 GM/15ML solution Commonly known as:  CHRONULAC Take 45 mLs (30 g total) by mouth 2 (two) times daily.   nadolol 20 MG tablet Commonly known as:  CORGARD TAKE 1 TABLET BY MOUTH EVERY DAY   pantoprazole 40 MG tablet Commonly known as:  PROTONIX TAKE 1 TABLET(40 MG) BY MOUTH DAILY What changed:  See the new instructions.   silver sulfADIAZINE 1 % cream Commonly known as:  SILVADENE Apply 1 application topically daily as needed. To  Burn   spironolactone 50 MG tablet Commonly known as:  ALDACTONE Take 1 tablet (50 mg total) by mouth 2 (two) times daily. What changed:  how much to take   thiamine 100 MG tablet Take 1 tablet (100 mg total) by mouth daily.   triamcinolone 0.025 % ointment Commonly known as:  KENALOG Apply 1 application topically 2 (two) times daily as needed (rash).      No Known Allergies Follow-up Information    [facility] Follow up.   Why:  for home IV antibiotics Contact information: [address] [place] [address] [phone]        [facility] Follow up.   Specialty:  Home Health Services Why:  to continue with home health Contact information: [place] [address] [phone]        [name], MD Follow up in 1 week(s).   Specialty:  Family Medicine Contact information: [place] [address] [phone]        Josue Hector, MD .   Specialty:  Cardiology Contact information: [phone] [address] [place] [address] [phone]            The results of significant diagnostics from  this hospitalization (including imaging, microbiology, ancillary and laboratory) are listed below for reference.    Significant Diagnostic Studies: Dg Chest 1 View  Result Date: 05/18/2018 CLINICAL DATA:  63 year old male with altered mental status, combative. EXAM: CHEST  1 VIEW COMPARISON:  05/10/2018 and earlier. FINDINGS: Portable AP upright view at 1139 hours. Right PICC line is in place, tip at the lower SVC level. Normal cardiac size and mediastinal contours. Visualized tracheal air column is within normal limits. Allowing for portable technique the lungs are clear. No pneumothorax. No acute osseous abnormality identified. IMPRESSION: No cardiopulmonary abnormality.  Right PICC line in place. Electronically Signed   By: Genevie Ann M.D.   On: 05/18/2018 11:53   Ct Head Wo Contrast  Result Date: 05/10/2018 CLINICAL DATA:  Altered mental status/confusion EXAM: CT HEAD WITHOUT CONTRAST TECHNIQUE: Contiguous axial images were obtained from the base of  the skull through the vertex without intravenous contrast. COMPARISON:  MR brain dated 11/02/2009 FINDINGS: Brain: No evidence of acute infarction, hemorrhage, hydrocephalus, extra-axial collection or mass lesion/mass effect. Subcortical white matter and periventricular small vessel ischemic changes. Vascular: No hyperdense vessel or unexpected calcification. Skull: Normal. Negative for fracture or focal lesion. Sinuses/Orbits: The visualized paranasal sinuses are essentially clear. The mastoid air cells are unopacified. Other: None. IMPRESSION: No evidence of acute intracranial abnormality. Small vessel ischemic changes. Electronically Signed   By: Julian Hy M.D.   On: 05/10/2018 02:27   Dg Chest Port 1 View  Result Date: 05/10/2018 CLINICAL DATA:  Mental status changes. EXAM: PORTABLE CHEST 1 VIEW COMPARISON:  07/10/2017 FINDINGS: Normal heart size and pulmonary vascularity. No focal airspace disease or consolidation in the lungs. No blunting of  costophrenic angles. No pneumothorax. Mediastinal contours appear intact. Degenerative changes in the shoulders. Old left rib fractures. IMPRESSION: No active disease. Electronically Signed   By: Lucienne Capers M.D.   On: 05/10/2018 01:01   Dg Abd Portable 1v  Result Date: 05/19/2018 CLINICAL DATA:  Evaluate NG tube EXAM: PORTABLE ABDOMEN - 1 VIEW COMPARISON:  None. FINDINGS: The NG tube terminates over the upper chest. It projects over the left lateral aspect of the trachea. It is unclear whether this tube is in the airway or the upper esophagus. No other acute abnormalities. IMPRESSION: 1. The NG tube terminates over the upper chest, either within the trachea or upper esophagus. Findings called to the patient's nurse, Belenda Cruise. By report, the NG tube has been removed. Electronically Signed   By: Dorise Bullion III M.D   On: 05/19/2018 20:19   [place] Ekg Site Rite  Result Date: 05/14/2018 If Site Rite image not attached, placement could not be confirmed due to current cardiac rhythm.   Microbiology: Recent Results (from the past 240 hour(s))  Culture, blood (routine x 2)     Status: None   Collection Time: 05/11/18  4:00 PM  Result Value Ref Range Status   Specimen Description BLOOD LEFT HAND  Final   Special Requests   Final    BOTTLES DRAWN AEROBIC ONLY Blood Culture adequate volume   Culture   Final    NO GROWTH 5 DAYS Performed at [facility], [address], [place], [place] [address]    Report Status 05/16/2018 FINAL  Final     Labs: Basic Metabolic Panel: Recent Labs  Lab 05/18/18 1105 05/18/18 1211 05/18/18 1527 05/19/18 0408 05/20/18 0408 05/21/18 0456  NA 139  --  141 144 146* 150*  K 6.0* 6.0* 6.1* 6.4* 4.2 3.4*  CL 110  --  114* 116* 120* 125*  CO2 23  --  20* 19* 19* 19*  GLUCOSE 109*  --  100* 75 92 117*  BUN 42*  --  42* 39* 32* 20  CREATININE 2.12*  --  2.00* 1.86* 1.51* 1.28*  CALCIUM 9.1  --  9.1 6.7* 8.4* 8.4*   Liver Function Tests: Recent  Labs  Lab 05/18/18 1105 05/19/18 0408 05/20/18 0408  AST 34 29 29  ALT 5 6 5   ALKPHOS 103 62 55  BILITOT 1.5* 1.9* 1.7*  PROT 6.9 6.2* 6.4*  ALBUMIN 2.9* 2.5* 2.6*   No results for input(s): LIPASE, AMYLASE in the last 168 hours. Recent Labs  Lab 05/18/18 1100 05/19/18 0422 05/21/18 0456  AMMONIA 254* 67* 55*   CBC: Recent Labs  Lab 05/18/18 1105 05/19/18 0408 05/20/18 0408  WBC 3.9* 3.3*  3.7*  NEUTROABS 2.2  --   --   HGB 8.1* 8.4* 7.9*  HCT 24.9* 26.1* 24.9*  MCV 112.2* 108.8* 111.2*  PLT 84* 81* 104*      Signed:  Oswald Hillock MD.  [facility] 05/21/2018, 3:20 PM

## 2018-05-24 ENCOUNTER — Inpatient Hospital Stay: Payer: BC Managed Care – PPO | Admitting: Family

## 2018-05-28 ENCOUNTER — Inpatient Hospital Stay: Payer: BC Managed Care – PPO | Admitting: Family Medicine

## 2018-06-03 ENCOUNTER — Encounter (HOSPITAL_COMMUNITY): Payer: Self-pay | Admitting: Emergency Medicine

## 2018-06-03 ENCOUNTER — Emergency Department (HOSPITAL_COMMUNITY)
Admission: EM | Admit: 2018-06-03 | Discharge: 2018-06-03 | Discharge status: Against Medical Advice | Payer: BC Managed Care – PPO | Attending: Emergency Medicine | Admitting: Emergency Medicine

## 2018-06-03 ENCOUNTER — Emergency Department (HOSPITAL_COMMUNITY): Payer: BC Managed Care – PPO

## 2018-06-03 DIAGNOSIS — N183 Chronic kidney disease, stage 3 (moderate): Secondary | ICD-10-CM | POA: Insufficient documentation

## 2018-06-03 DIAGNOSIS — R4182 Altered mental status, unspecified: Secondary | ICD-10-CM | POA: Diagnosis not present

## 2018-06-03 DIAGNOSIS — I129 Hypertensive chronic kidney disease with stage 1 through stage 4 chronic kidney disease, or unspecified chronic kidney disease: Secondary | ICD-10-CM | POA: Insufficient documentation

## 2018-06-03 DIAGNOSIS — Z79899 Other long term (current) drug therapy: Secondary | ICD-10-CM | POA: Insufficient documentation

## 2018-06-03 DIAGNOSIS — Z96653 Presence of artificial knee joint, bilateral: Secondary | ICD-10-CM | POA: Insufficient documentation

## 2018-06-03 DIAGNOSIS — Z87891 Personal history of nicotine dependence: Secondary | ICD-10-CM | POA: Diagnosis not present

## 2018-06-03 MED ORDER — SODIUM CHLORIDE 0.9 % IV SOLN: INTRAVENOUS | Status: DC

## 2018-06-03 NOTE — ED Notes (Signed)
Pt refusing to allow this RN to draw blood. Pt able to tell this RN the month, his age, and who the president is. Pt states "you can't do things if I say you can't do them and I don't want my blood drawn. Nothings wrong with me. I was just sleeping and I woke up and my house was full of people. I'm not crazy". This RN informed Dr. Gilford Raid of pts refusal of blood work.

## 2018-06-03 NOTE — ED Provider Notes (Signed)
[facility] EMERGENCY DEPARTMENT Provider Note   CSN: [id number] Arrival date & time: 06/03/18  1446     History   Chief Complaint Chief Complaint  Patient presents with  . Altered Mental Status    HPI Mark [name] is a 63 y.o. male.  Pt presents to the ED today with [facility].  The pt was admitted from 1/24-27 for hepatic encephalopathy.  The pt is from home and EMS was called due to [facility].  Pt is unable to give any hx.  Pt is supposed to be taking lactulose, but it is unclear if he's been taking it.     Past Medical History:  Diagnosis Date  . Alcohol abuse   . Anemia   . Arthritis   . Ascites   . Cirrhosis ([place])   . Coffee ground emesis   . Dehydration 06/17/2017  . Febrile illness   . Heart murmur   . Hyperlipidemia   . Hypertension   . Leg swelling   . Myocardial infarction ([place]) 2012  . Preop cardiovascular exam 04/14/2013  . Sepsis ([place]) 06/17/2017  . Septic shock ([place]) 06/18/2017  . SIRS (systemic inflammatory response syndrome) ([place]) 07/11/2017  . Stroke [facility]) 2012   no deficits  . Thrombocytopenia [facility])     Patient Active Problem List   Diagnosis Date Noted  . Hyperkalemia 05/18/2018  . MSSA bacteremia 05/11/2018  . Edentulous 05/11/2018  . Pancytopenia ([place]) 05/10/2018  . CAD (coronary artery disease) 05/10/2018  . Hepatic encephalopathy ([place]) 05/10/2018  . Alcohol abuse 05/10/2018  . GERD (gastroesophageal reflux disease) 05/10/2018  . Duodenal ulcer   . Renal failure   . Acute on chronic anemia   . Hypotension 06/17/2017  . Acute on chronic renal failure ([place]) 06/17/2017  . Hyponatremia 06/17/2017  . Thrombocytopenia ([place]) 06/17/2017  . Leg edema, right 06/17/2017  . Acute metabolic encephalopathy 33/29/5188  . Anemia, iron deficiency   . Benign neoplasm of ascending colon   . Hemorrhoids   . Portal hypertensive gastropathy ([place])   . Gastritis and gastroduodenitis   . Esophageal varices without bleeding ([place])   .  H/O: CVA (cerebrovascular accident) 12/01/2013  . ACS (acute coronary syndrome) ([place]) 12/01/2013  . Anasarca 12/01/2013  . Substance abuse ([place]) 12/01/2013  . CKD (chronic kidney disease) stage 3, GFR 30-59 ml/min ([name]) 12/01/2013  . S/P right TKA 05/02/2013  . Murmur 04/14/2013  . Right inguinal hernia 12/26/2010  . Cirrhosis, alcoholic ([place]) 41/66/0630    Past Surgical History:  Procedure Laterality Date  . COLONOSCOPY WITH PROPOFOL N/A 02/07/2016   Procedure: COLONOSCOPY WITH PROPOFOL;  Surgeon: Milus Banister, MD;  Location: [facility] ENDOSCOPY;  Service: Endoscopy;  Laterality: N/A;  . ESOPHAGOGASTRODUODENOSCOPY (EGD) WITH PROPOFOL N/A 02/07/2016   Procedure: ESOPHAGOGASTRODUODENOSCOPY (EGD) WITH PROPOFOL;  Surgeon: Milus Banister, MD;  Location: [facility] ENDOSCOPY;  Service: Endoscopy;  Laterality: N/A;  . ESOPHAGOGASTRODUODENOSCOPY (EGD) WITH PROPOFOL N/A 06/22/2017   Procedure: ESOPHAGOGASTRODUODENOSCOPY (EGD) WITH PROPOFOL;  Surgeon: Jerene Bears, MD;  Location: [facility] ENDOSCOPY;  Service: Gastroenterology;  Laterality: N/A;  . HERNIA REPAIR Right    inguinal  . KNEE ARTHROSCOPY     bilateral/  12/14  . TEE WITHOUT CARDIOVERSION N/A 05/13/2018   Procedure: TRANSESOPHAGEAL ECHOCARDIOGRAM (TEE);  Surgeon: [name], MD;  Location: [facility] ENDOSCOPY;  Service: Cardiovascular;  Laterality: N/A;  . TOTAL KNEE ARTHROPLASTY Right 05/02/2013   Procedure: RIGHT TOTAL KNEE ARTHROPLASTY;  Surgeon: Mauri Pole, MD;  Location: [facility] ORS;  Service: Orthopedics;  Laterality: Right;        Home Medications    Prior to Admission medications   Medication Sig Start Date End Date Taking? Authorizing Provider  folic acid (FOLVITE) 1 MG tablet TAKE 1 TABLET BY MOUTH EVERY DAY Patient taking differently: Take 1 mg by mouth daily.  03/19/18  Yes [name], MD  pantoprazole (PROTONIX) 40 MG tablet TAKE 1 TABLET(40 MG) BY MOUTH DAILY Patient taking differently: Take 40 mg by mouth daily.  03/03/18  Yes  [name], MD  albuterol (PROVENTIL HFA;VENTOLIN HFA) 108 (90 Base) MCG/ACT inhaler Inhale 2 puffs into the lungs every 6 (six) hours as needed for wheezing or shortness of breath. Patient not taking: Reported on 06/03/2018 09/16/17   [name], MD  diphenhydrAMINE-zinc acetate (BENADRYL EXTRA STRENGTH) cream Apply 1 application topically 3 (three) times daily as needed for itching. Patient not taking: Reported on 06/03/2018 04/25/18   Domenic Moras, PA-C  furosemide (LASIX) 20 MG tablet TAKE 1 TABLET BY MOUTH TWICE DAILY Patient taking differently: Take 20 mg by mouth 2 (two) times daily.  04/19/18   [name], MD  guaiFENesin (MUCINEX) 600 MG 12 hr tablet Take 1 tablet (600 mg total) by mouth 2 (two) times daily as needed for cough or to loosen phlegm. Patient not taking: Reported on 06/03/2018 07/14/17   Rosita Fire, MD  lactulose (CHRONULAC) 10 GM/15ML solution Take 45 mLs (30 g total) by mouth 2 (two) times daily. 12/03/17   [name], MD  nadolol (CORGARD) 20 MG tablet TAKE 1 TABLET BY MOUTH EVERY DAY Patient taking differently: Take 20 mg by mouth daily.  02/17/18   [name], MD  silver sulfADIAZINE (SILVADENE) 1 % cream Apply 1 application topically daily as needed. To  Burn Patient not taking: Reported on 06/03/2018 05/14/18   Aline August, MD  spironolactone (ALDACTONE) 50 MG tablet Take 1 tablet (50 mg total) by mouth 2 (two) times daily. 05/21/18   Oswald Hillock, MD  thiamine 100 MG tablet Take 1 tablet (100 mg total) by mouth daily. Patient not taking: Reported on 06/03/2018 12/04/17   [name], MD  triamcinolone (KENALOG) 0.025 % ointment Apply 1 application topically 2 (two) times daily as needed (rash). 05/14/18   Aline August, MD    Family History Family History  Problem Relation Age of Onset  . Hypertension Father   . Diabetes Father   . Dementia Mother   . Lupus Sister     Social History Social History   Tobacco Use  . Smoking  status: Former Smoker    Packs/day: 0.50    Years: 10.00    Pack years: 5.00    Types: Cigarettes    Last attempt to quit: 01/05/2016    Years since quitting: 2.4  . Smokeless tobacco: Never Used  . Tobacco comment: 4 cigarettes a day  Substance Use Topics  . Alcohol use: No    Comment: 40 oz per week as of 01-19-17  . Drug use: Yes    Types: Marijuana, Heroin    Comment: took about 1 month ago- as of 01-19-17     Allergies   Patient has no known allergies.   Review of Systems Review of Systems  Unable to perform ROS: Mental status change     Physical Exam Updated Vital Signs BP 125/60   Pulse 78   Temp 98.3 F (36.8 C) (Axillary)   Resp 18  Ht 5\' 8"  (1.727 m)   Wt 57.4 kg   SpO2 100%   BMI 19.24 kg/m   Physical Exam Vitals signs and nursing note reviewed.  Constitutional:      Appearance: Normal appearance.  HENT:     Head: Normocephalic and atraumatic.     Right Ear: External ear normal.     Left Ear: External ear normal.     Nose: Nose normal.     Mouth/Throat:     Mouth: Mucous membranes are dry.  Eyes:     Extraocular Movements: Extraocular movements intact.     Conjunctiva/sclera: Conjunctivae normal.     Pupils: Pupils are equal, round, and reactive to light.  Neck:     Musculoskeletal: Normal range of motion and neck supple.  Cardiovascular:     Rate and Rhythm: Normal rate and regular rhythm.     Pulses: Normal pulses.     Heart sounds: Normal heart sounds.  Pulmonary:     Effort: Pulmonary effort is normal.     Breath sounds: Normal breath sounds.  Abdominal:     General: Abdomen is flat. Bowel sounds are normal.  Musculoskeletal:     Right lower leg: Edema present.     Left lower leg: Edema present.  Skin:    General: Skin is warm.     Capillary Refill: Capillary refill takes less than 2 seconds.  Neurological:     Mental Status: He is lethargic and confused.     Comments: Pt is moving all 4 extremities, but is not following commands.   Psychiatric:        Behavior: Behavior is uncooperative.      ED Treatments / Results  Labs (all labs ordered are listed, but only abnormal results are displayed) Labs Reviewed  ETHANOL  RAPID URINE DRUG SCREEN, HOSP PERFORMED  CBC WITH DIFFERENTIAL/PLATELET  COMPREHENSIVE METABOLIC PANEL  AMMONIA  URINALYSIS, ROUTINE W REFLEX MICROSCOPIC    EKG EKG Interpretation  Date/Time:  Thursday June 03 2018 15:47:48 EST Ventricular Rate:  74 PR Interval:    QRS Duration: 85 QT Interval:  409 QTC Calculation: 454 R Axis:   41 Text Interpretation:  Sinus rhythm Consider left atrial enlargement Left ventricular hypertrophy Anterior infarct, old Confirmed by Isla Pence [phone]) on 06/03/2018 3:49:49 PM   Radiology No results found.  Procedures Procedures (including critical care time)  Medications Ordered in ED Medications  0.9 %  sodium chloride infusion (has no administration in time range)     Initial Impression / Assessment and Plan / ED Course  I have reviewed the triage vital signs and the nursing notes.  Pertinent labs & imaging results that were available during my care of the patient were reviewed by me and considered in my medical decision making (see chart for details).  Pt is now awake and alert.  He is oriented times 3.  He is refusing any blood work or tests.  I attempted to call his wife, but no one picked up.  Pt said he was just asleep.  I suspect he does have some encephalopathy, but as he is oriented times 3 now, he can go home as requested.  Final Clinical Impressions(s) / ED Diagnoses   Final diagnoses:  Altered mental status, unspecified altered mental status type    ED Discharge Orders    None       Isla Pence, MD 06/03/18 1555

## 2018-06-03 NOTE — ED Notes (Signed)
Pt upset in room. Pt upset that his wife called EMS. Pt verbalized understanding of AMA. Pt signed AMA form and given paperwork. Pt encouraged to come back if he gets worse or has any other concerns. Pt wheeled out to waiting room.

## 2018-06-03 NOTE — ED Triage Notes (Signed)
BIB [facility] from home, called by wife who states that pt is having periods of unresponsiveness, and then uncooperativeness. Pt is intermittently yelling then falls directly to sleep. Refusing to get undressed or have any vitals taken-- able to talk pt into vital signs,

## 2018-06-03 NOTE — ED Notes (Signed)
Dr. Haviland at bedside. 

## 2018-06-03 NOTE — ED Notes (Signed)
Pt removed all monitoring equipment. 

## 2018-06-17 ENCOUNTER — Ambulatory Visit: Payer: BC Managed Care – PPO | Admitting: Internal Medicine

## 2018-07-21 ENCOUNTER — Ambulatory Visit: Payer: BC Managed Care – PPO | Admitting: Internal Medicine

## 2018-07-21 ENCOUNTER — Other Ambulatory Visit: Payer: Self-pay | Admitting: Family Medicine

## 2018-08-22 ENCOUNTER — Other Ambulatory Visit: Payer: Self-pay

## 2018-08-22 ENCOUNTER — Emergency Department (HOSPITAL_COMMUNITY)
Admission: EM | Admit: 2018-08-22 | Discharge: 2018-08-22 | Disposition: A | Discharge status: Home / Self Care | Payer: Medicare Other | Attending: Emergency Medicine | Admitting: Emergency Medicine

## 2018-08-22 ENCOUNTER — Encounter (HOSPITAL_COMMUNITY): Payer: Self-pay | Admitting: Emergency Medicine

## 2018-08-22 DIAGNOSIS — D649 Anemia, unspecified: Secondary | ICD-10-CM | POA: Insufficient documentation

## 2018-08-22 DIAGNOSIS — R2241 Localized swelling, mass and lump, right lower limb: Secondary | ICD-10-CM | POA: Diagnosis present

## 2018-08-22 DIAGNOSIS — L03116 Cellulitis of left lower limb: Secondary | ICD-10-CM | POA: Insufficient documentation

## 2018-08-22 DIAGNOSIS — Z96651 Presence of right artificial knee joint: Secondary | ICD-10-CM | POA: Diagnosis not present

## 2018-08-22 DIAGNOSIS — N183 Chronic kidney disease, stage 3 (moderate): Secondary | ICD-10-CM | POA: Insufficient documentation

## 2018-08-22 DIAGNOSIS — I2510 Atherosclerotic heart disease of native coronary artery without angina pectoris: Secondary | ICD-10-CM | POA: Insufficient documentation

## 2018-08-22 DIAGNOSIS — I129 Hypertensive chronic kidney disease with stage 1 through stage 4 chronic kidney disease, or unspecified chronic kidney disease: Secondary | ICD-10-CM | POA: Insufficient documentation

## 2018-08-22 DIAGNOSIS — Z87891 Personal history of nicotine dependence: Secondary | ICD-10-CM | POA: Diagnosis not present

## 2018-08-22 DIAGNOSIS — R609 Edema, unspecified: Secondary | ICD-10-CM

## 2018-08-22 DIAGNOSIS — Z79899 Other long term (current) drug therapy: Secondary | ICD-10-CM | POA: Insufficient documentation

## 2018-08-22 LAB — COMPREHENSIVE METABOLIC PANEL
ALT: 11 U/L (ref 0–44)
AST: 25 U/L (ref 15–41)
Albumin: 1.7 g/dL — ABNORMAL LOW (ref 3.5–5.0)
Alkaline Phosphatase: 137 U/L — ABNORMAL HIGH (ref 38–126)
Anion gap: 6 (ref 5–15)
BUN: 9 mg/dL (ref 8–23)
CO2: 18 mmol/L — ABNORMAL LOW (ref 22–32)
Calcium: 7.9 mg/dL — ABNORMAL LOW (ref 8.9–10.3)
Chloride: 110 mmol/L (ref 98–111)
Creatinine, Ser: 0.95 mg/dL (ref 0.61–1.24)
GFR calc Af Amer: >60 mL/min (ref >60)
GFR calc non Af Amer: >60 mL/min (ref >60)
Glucose, Bld: 77 mg/dL (ref 70–99)
Potassium: 3.9 mmol/L (ref 3.5–5.1)
Sodium: 134 mmol/L — ABNORMAL LOW (ref 135–145)
Total Bilirubin: 0.7 mg/dL (ref 0.3–1.2)
Total Protein: 6.7 g/dL (ref 6.5–8.1)

## 2018-08-22 LAB — CBC WITH DIFFERENTIAL/PLATELET
Abs Immature Granulocytes: 0.01 10*3/uL (ref 0.00–0.07)
Basophils Absolute: 0 10*3/uL (ref 0.0–0.1)
Basophils Relative: 1 %
Eosinophils Absolute: 0.6 10*3/uL — ABNORMAL HIGH (ref 0.0–0.5)
Eosinophils Relative: 17 %
HCT: 21 % — ABNORMAL LOW (ref 39.0–52.0)
Hemoglobin: 6.9 g/dL — CL (ref 13.0–17.0)
Immature Granulocytes: 0 %
Lymphocytes Relative: 26 %
Lymphs Abs: 0.9 10*3/uL (ref 0.7–4.0)
MCH: 31.2 pg (ref 26.0–34.0)
MCHC: 32.9 g/dL (ref 30.0–36.0)
MCV: 95 fL (ref 80.0–100.0)
Monocytes Absolute: 0.7 10*3/uL (ref 0.1–1.0)
Monocytes Relative: 19 %
Neutro Abs: 1.4 10*3/uL — ABNORMAL LOW (ref 1.7–7.7)
Neutrophils Relative %: 37 %
Platelets: 138 10*3/uL — ABNORMAL LOW (ref 150–400)
RBC: 2.21 MIL/uL — ABNORMAL LOW (ref 4.22–5.81)
RDW: 15 % (ref 11.5–15.5)
WBC: 3.7 10*3/uL — ABNORMAL LOW (ref 4.0–10.5)
nRBC: 0 % (ref 0.0–0.2)

## 2018-08-22 MED ORDER — KETOCONAZOLE 2 % EX SHAM: 1.0000 "application " | MEDICATED_SHAMPOO | CUTANEOUS | 0 refills | Status: DC

## 2018-08-22 MED ORDER — SULFAMETHOXAZOLE-TRIMETHOPRIM 800-160 MG PO TABS: 1.0000 | ORAL_TABLET | Freq: Two times a day (BID) | ORAL | 0 refills | Status: AC

## 2018-08-22 NOTE — Discharge Instructions (Addendum)
You were seen in the ED for feet swelling and pain. You have a fungal infection in all your toes and toe nails.  Keep the skin clean and dry as much as possible. Apply ketoconazole shampoo after showering and let it dry, rinse off. Do this shampoo daily at least once a day. You have a bacterial infection in your left toe, take bactrim for infection.  Return to the emergency room for fever greater than 100, worsening swelling redness warmth or discharge form the foot  Your hemoglobin was low today, lower than your baseline.  You need to call your doctor TOMORROW and make an appointment to get your hemoglobin re-checked in 48-72 hours. I will send a message to your doctor [name] today to help you set up a re-check of your blood work soon. If you cannot get this done, return to the ED for repeat lab work. Return if you have blood in stool or vomit, black stool, chest pain, shortness of breath, light-headedness, passing out.

## 2018-08-22 NOTE — ED Triage Notes (Signed)
Pt reports swollen feet for the past 2 weeks. Pt reports not going to his PCP and now the feet are hurting.

## 2018-08-22 NOTE — ED Provider Notes (Addendum)
[facility] EMERGENCY DEPARTMENT Provider Note   CSN: [id number] Arrival date & time: 08/22/18  6144    History   Chief Complaint Chief Complaint  Patient presents with  . Edema    HPI Mark [name] is a 63 y.o. male with history of alcoholic cirrhosis, CVA, substance abuse, CKD, anemia, thrombocytopenia, alcohol use, MI is here for evaluation of symmetric bilateral feet swelling onset 2 weeks ago.  Associated with pain bilaterally worse in heels. Last night his wife stepped on his left fifth toe and since it has been hurting more, swelling, oozing discharge. Pain is constant, moderate to severe.  It is worse with weightbearing and palpation.  Takes Spironolactone and furosemide daily but admits missing some doses.  He has no associated fever, chills, chest pain, shortness of breath, orthopnea, calf pain.  Last time he used drugs was 3 weeks ago, he started heroin.  He denies IV drug use.  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  HPI  Past Medical History:  Diagnosis Date  . Alcohol abuse   . Anemia   . Arthritis   . Ascites   . Cirrhosis ([place])   . Coffee ground emesis   . Dehydration 06/17/2017  . Febrile illness   . Heart murmur   . Hyperlipidemia   . Hypertension   . Leg swelling   . Myocardial infarction ([place]) 2012  . Preop cardiovascular exam 04/14/2013  . Sepsis ([place]) 06/17/2017  . Septic shock ([place]) 06/18/2017  . SIRS (systemic inflammatory response syndrome) ([place]) 07/11/2017  . Stroke [facility]) 2012   no deficits  . Thrombocytopenia [facility])     Patient Active Problem List   Diagnosis Date Noted  . Hyperkalemia 05/18/2018  . MSSA bacteremia 05/11/2018  . Edentulous 05/11/2018  . Pancytopenia ([place]) 05/10/2018  . CAD (coronary artery disease) 05/10/2018  . Hepatic encephalopathy ([place]) 05/10/2018  . Alcohol abuse 05/10/2018  . GERD (gastroesophageal reflux disease) 05/10/2018  . Duodenal ulcer   . Renal  failure   . Acute on chronic anemia   . Hypotension 06/17/2017  . Acute on chronic renal failure ([place]) 06/17/2017  . Hyponatremia 06/17/2017  . Thrombocytopenia ([name]) 06/17/2017  . Leg edema, right 06/17/2017  . Acute metabolic encephalopathy 31/54/0086  . Anemia, iron deficiency   . Benign neoplasm of ascending colon   . Hemorrhoids   . Portal hypertensive gastropathy ([place])   . Gastritis and gastroduodenitis   . Esophageal varices without bleeding ([place])   . H/O: CVA (cerebrovascular accident) 12/01/2013  . ACS (acute coronary syndrome) ([place]) 12/01/2013  . Anasarca 12/01/2013  . Substance abuse ([place]) 12/01/2013  . CKD (chronic kidney disease) stage 3, GFR 30-59 ml/min ([name]) 12/01/2013  . S/P right TKA 05/02/2013  . Murmur 04/14/2013  . Right inguinal hernia 12/26/2010  . Cirrhosis, alcoholic ([place]) 76/19/5093    Past Surgical History:  Procedure Laterality Date  . COLONOSCOPY WITH PROPOFOL N/A 02/07/2016   Procedure: COLONOSCOPY WITH PROPOFOL;  Surgeon: Milus Banister, MD;  Location: [facility] ENDOSCOPY;  Service: Endoscopy;  Laterality: N/A;  . ESOPHAGOGASTRODUODENOSCOPY (EGD) WITH PROPOFOL N/A 02/07/2016   Procedure: ESOPHAGOGASTRODUODENOSCOPY (EGD) WITH PROPOFOL;  Surgeon: Milus Banister, MD;  Location: [facility] ENDOSCOPY;  Service: Endoscopy;  Laterality: N/A;  . ESOPHAGOGASTRODUODENOSCOPY (EGD) WITH PROPOFOL N/A 06/22/2017   Procedure: ESOPHAGOGASTRODUODENOSCOPY (EGD) WITH PROPOFOL;  Surgeon: Jerene Bears, MD;  Location[facility] ENDOSCOPY;  Service: Gastroenterology;  Laterality: N/A;  . HERNIA REPAIR Right  inguinal  . KNEE ARTHROSCOPY     bilateral/  12/14  . TEE WITHOUT CARDIOVERSION N/A 05/13/2018   Procedure: TRANSESOPHAGEAL ECHOCARDIOGRAM (TEE);  Surgeon: [name], MD;  Location: [facility] ENDOSCOPY;  Service: Cardiovascular;  Laterality: N/A;  . TOTAL KNEE ARTHROPLASTY Right 05/02/2013   Procedure: RIGHT TOTAL KNEE ARTHROPLASTY;  Surgeon: Mauri Pole, MD;  Location: [facility] ORS;   Service: Orthopedics;  Laterality: Right;        Home Medications    Prior to Admission medications   Medication Sig Start Date End Date Taking? Authorizing Provider  albuterol (PROVENTIL HFA;VENTOLIN HFA) 108 (90 Base) MCG/ACT inhaler Inhale 2 puffs into the lungs every 6 (six) hours as needed for wheezing or shortness of breath. Patient not taking: Reported on 06/03/2018 09/16/17   [name], MD  diphenhydrAMINE-zinc acetate (BENADRYL EXTRA STRENGTH) cream Apply 1 application topically 3 (three) times daily as needed for itching. Patient not taking: Reported on 06/03/2018 04/25/18   Domenic Moras, PA-C  folic acid (FOLVITE) 1 MG tablet TAKE 1 TABLET BY MOUTH EVERY DAY 07/23/18   [name], MD  furosemide (LASIX) 20 MG tablet TAKE 1 TABLET BY MOUTH TWICE DAILY 07/23/18   [name], MD  guaiFENesin (MUCINEX) 600 MG 12 hr tablet Take 1 tablet (600 mg total) by mouth 2 (two) times daily as needed for cough or to loosen phlegm. Patient not taking: Reported on 06/03/2018 07/14/17   Rosita Fire, MD  ketoconazole (NIZORAL) 2 % shampoo Apply 1 application topically 2 (two) times a week. 08/23/18   Kinnie Feil, PA-C  lactulose (CHRONULAC) 10 GM/15ML solution Take 45 mLs (30 g total) by mouth 2 (two) times daily. 12/03/17   [name], MD  nadolol (CORGARD) 20 MG tablet TAKE 1 TABLET BY MOUTH EVERY DAY Patient taking differently: Take 20 mg by mouth daily.  02/17/18   [name], MD  pantoprazole (PROTONIX) 40 MG tablet TAKE 1 TABLET(40 MG) BY MOUTH DAILY Patient taking differently: Take 40 mg by mouth daily.  03/03/18   [name], MD  silver sulfADIAZINE (SILVADENE) 1 % cream Apply 1 application topically daily as needed. To  Burn Patient not taking: Reported on 06/03/2018 05/14/18   Aline August, MD  spironolactone (ALDACTONE) 50 MG tablet Take 1 tablet (50 mg total) by mouth 2 (two) times daily. 05/21/18   Oswald Hillock, MD  sulfamethoxazole-trimethoprim  (BACTRIM DS) 800-160 MG tablet Take 1 tablet by mouth 2 (two) times daily for 7 days. 08/22/18 08/29/18  Kinnie Feil, PA-C  thiamine 100 MG tablet Take 1 tablet (100 mg total) by mouth daily. Patient not taking: Reported on 06/03/2018 12/04/17   [name], MD  triamcinolone (KENALOG) 0.025 % ointment Apply 1 application topically 2 (two) times daily as needed (rash). 05/14/18   Aline August, MD    Family History Family History  Problem Relation Age of Onset  . Hypertension Father   . Diabetes Father   . Dementia Mother   . Lupus Sister     Social History Social History   Tobacco Use  . Smoking status: Former Smoker    Packs/day: 0.50    Years: 10.00    Pack years: 5.00    Types: Cigarettes    Last attempt to quit: 01/05/2016    Years since quitting: 2.6  . Smokeless tobacco: Never Used  . Tobacco comment: 4 cigarettes a day  Substance Use Topics  . Alcohol use: Yes  .  Drug use: Yes    Types: Marijuana, Heroin     Allergies   Patient has no known allergies.   Review of Systems Review of Systems  Cardiovascular: Positive for leg swelling.  Skin: Positive for color change and wound.  All other systems reviewed and are negative.    Physical Exam Updated Vital Signs BP (!) 110/54 (BP Location: Right Arm)   Pulse 74   Temp 98.3 F (36.8 C) (Oral)   Resp 20   Ht _0  (1.676 m)   Wt 63.5 kg   SpO2 99%   BMI 22.60 kg/m   Physical Exam Vitals signs and nursing note reviewed.  Constitutional:      General: He is not in acute distress.    Appearance: He is well-developed.     Comments: NAD.  HENT:     Head: Normocephalic and atraumatic.     Right Ear: External ear normal.     Left Ear: External ear normal.     Nose: Nose normal.  Eyes:     General: No scleral icterus.    Conjunctiva/sclera: Conjunctivae normal.  Neck:     Musculoskeletal: Normal range of motion and neck supple.  Cardiovascular:     Rate and Rhythm: Normal rate and regular  rhythm.     Heart sounds: Normal heart sounds.     Comments: 1+ pitting edema symmetric from feet up to the mid shin.  No calf tenderness. Pulmonary:     Effort: Pulmonary effort is normal.     Breath sounds: Normal breath sounds. No wheezing.  Musculoskeletal: Normal range of motion.        General: No deformity.  Skin:    General: Skin is warm and dry.     Capillary Refill: Capillary refill takes less than 2 seconds.     Findings: Signs of injury present.     Comments: Dry, flaky skin to the lower extremities and feet.  Thickened and dark toenails bilaterally.  Macerated skin in between the toes.  Left fourth toe webspace has a small abrasion with mild edema, erythema and discharge.  This is focally tender.  No streaking up the midfoot or leg.  No significant warmth.  Neurological:     Mental Status: He is alert and oriented to person, place, and time.     Comments: Strength and sensation intact in lower extremities bilaterally  Psychiatric:        Behavior: Behavior normal.        Thought Content: Thought content normal.        Judgment: Judgment normal.     ED Treatments / Results  Labs (all labs ordered are listed, but only abnormal results are displayed) Labs Reviewed  CBC WITH DIFFERENTIAL/PLATELET - Abnormal; Notable for the following components:      Result Value   WBC 3.7 (*)    RBC 2.21 (*)    Hemoglobin 6.9 (*)    HCT 21.0 (*)    Platelets 138 (*)    Neutro Abs 1.4 (*)    Eosinophils Absolute 0.6 (*)    All other components within normal limits  COMPREHENSIVE METABOLIC PANEL - Abnormal; Notable for the following components:   Sodium 134 (*)    CO2 18 (*)    Calcium 7.9 (*)    Albumin 1.7 (*)    Alkaline Phosphatase 137 (*)    All other components within normal limits    EKG None  Radiology No results found.  Procedures Procedures (including  critical care time)  Medications Ordered in ED Medications - No data to display   Initial Impression /  Assessment and Plan / ED Course  I have reviewed the triage vital signs and the nursing notes.  Pertinent labs & imaging results that were available during my care of the patient were reviewed by me and considered in my medical decision making (see chart for details).  Clinical Course as of Aug 22 1143  Sun Aug 22, 2018  1117 Platelets(!): 138 [CG]  1117 Hemoglobin(!!): 6.9 [CG]  1117 WBC(!): 3.7 [CG]    Clinical Course User Index [CG] Kinnie Feil, PA-C      Clinical exam most consistent with chronic maceration leading to fungal and superimposed bacterial infection.   No clinical signs of severe cellulitis or abscess. No clinical signs of significant fluid overload, orthopnea. Afebrile without tachycardia, tachypnea, hypoxia.  No signs of DVT. No symptoms to suggest PE/DVT otherwise.  Will defer CXR and BNP not thought to be indicated as he has no respiratory symptoms, h/o HF and lungs are clear. Given know liver disease and kidney disease, with new although mild lower extremity swelling will check LFT and creatinine, WBC for infection.  He admits to med non compliance and prolonged standing doing mechanic/yard work, this is likely cause of swelling.    Final Clinical Impressions(s) / ED Diagnoses   1130: Stable thrombocytopenia and elevated alk phos.  Slightly worse anemia with baseline 7.6-9.1 this year. Asymptomatic. He has no melena, CP, Sob, light-headedness, hematemesis.  I discussed drop in hemoglobin with patient, he is aware importance of f/u with PCP for H/H recheck in 72 hours.  He understands the risks of noncompliance with f/u.  He is aware of symptoms that would warrant immediate return to ED. Although hgb has dropped slightly from baseline I see no indication for immediate emergent lab work today.  Given stable VS and lack of symptoms, he can f/u as OP with PCP for this. His initial reason for ED visit today was foot pain, we will dc with antibiotics and symptomatic care.   Difficult situation given underlying cirrhosis, renal disease and heroin use and pain control is limited in this patient. Discussed with EDMD.    Final diagnoses:  Peripheral edema  Low hemoglobin  Cellulitis of left foot    ED Discharge Orders         Ordered    ketoconazole (NIZORAL) 2 % shampoo  2 times weekly     08/22/18 1142    sulfamethoxazole-trimethoprim (BACTRIM DS) 800-160 MG tablet  2 times daily     08/22/18 1139            Arlean Hopping 08/22/18 1145    Jola Schmidt, MD 08/22/18 1150

## 2018-08-26 ENCOUNTER — Other Ambulatory Visit: Payer: Self-pay | Admitting: Family Medicine

## 2018-08-26 DIAGNOSIS — E7220 Disorder of urea cycle metabolism, unspecified: Secondary | ICD-10-CM

## 2018-09-19 ENCOUNTER — Inpatient Hospital Stay (HOSPITAL_COMMUNITY)
Admission: EM | Admit: 2018-09-19 | Discharge: 2018-10-05 | DRG: 094 | Disposition: A | Discharge status: Hospice | Payer: Medicare Other | Attending: Internal Medicine | Admitting: Internal Medicine

## 2018-09-19 ENCOUNTER — Other Ambulatory Visit: Payer: Self-pay

## 2018-09-19 ENCOUNTER — Encounter (HOSPITAL_COMMUNITY): Payer: Self-pay | Admitting: Emergency Medicine

## 2018-09-19 ENCOUNTER — Other Ambulatory Visit: Payer: Self-pay | Admitting: Family Medicine

## 2018-09-19 DIAGNOSIS — E162 Hypoglycemia, unspecified: Secondary | ICD-10-CM | POA: Diagnosis present

## 2018-09-19 DIAGNOSIS — F1010 Alcohol abuse, uncomplicated: Secondary | ICD-10-CM | POA: Diagnosis present

## 2018-09-19 DIAGNOSIS — R0682 Tachypnea, not elsewhere classified: Secondary | ICD-10-CM

## 2018-09-19 DIAGNOSIS — E871 Hypo-osmolality and hyponatremia: Secondary | ICD-10-CM | POA: Diagnosis present

## 2018-09-19 DIAGNOSIS — I129 Hypertensive chronic kidney disease with stage 1 through stage 4 chronic kidney disease, or unspecified chronic kidney disease: Secondary | ICD-10-CM | POA: Diagnosis present

## 2018-09-19 DIAGNOSIS — M48061 Spinal stenosis, lumbar region without neurogenic claudication: Secondary | ICD-10-CM

## 2018-09-19 DIAGNOSIS — K7290 Hepatic failure, unspecified without coma: Secondary | ICD-10-CM | POA: Diagnosis present

## 2018-09-19 DIAGNOSIS — F1910 Other psychoactive substance abuse, uncomplicated: Secondary | ICD-10-CM | POA: Diagnosis not present

## 2018-09-19 DIAGNOSIS — R7881 Bacteremia: Secondary | ICD-10-CM

## 2018-09-19 DIAGNOSIS — Z833 Family history of diabetes mellitus: Secondary | ICD-10-CM

## 2018-09-19 DIAGNOSIS — I2510 Atherosclerotic heart disease of native coronary artery without angina pectoris: Secondary | ICD-10-CM | POA: Diagnosis present

## 2018-09-19 DIAGNOSIS — E8809 Other disorders of plasma-protein metabolism, not elsewhere classified: Secondary | ICD-10-CM | POA: Diagnosis present

## 2018-09-19 DIAGNOSIS — M0008 Staphylococcal arthritis, vertebrae: Secondary | ICD-10-CM | POA: Diagnosis present

## 2018-09-19 DIAGNOSIS — E872 Acidosis: Secondary | ICD-10-CM | POA: Diagnosis present

## 2018-09-19 DIAGNOSIS — K7682 Hepatic encephalopathy: Secondary | ICD-10-CM | POA: Diagnosis present

## 2018-09-19 DIAGNOSIS — G061 Intraspinal abscess and granuloma: Secondary | ICD-10-CM

## 2018-09-19 DIAGNOSIS — M545 Low back pain, unspecified: Secondary | ICD-10-CM

## 2018-09-19 DIAGNOSIS — G8929 Other chronic pain: Secondary | ICD-10-CM | POA: Diagnosis present

## 2018-09-19 DIAGNOSIS — N19 Unspecified kidney failure: Secondary | ICD-10-CM

## 2018-09-19 DIAGNOSIS — G062 Extradural and subdural abscess, unspecified: Secondary | ICD-10-CM | POA: Diagnosis present

## 2018-09-19 DIAGNOSIS — Z96651 Presence of right artificial knee joint: Secondary | ICD-10-CM | POA: Diagnosis present

## 2018-09-19 DIAGNOSIS — Z832 Family history of diseases of the blood and blood-forming organs and certain disorders involving the immune mechanism: Secondary | ICD-10-CM

## 2018-09-19 DIAGNOSIS — B9562 Methicillin resistant Staphylococcus aureus infection as the cause of diseases classified elsewhere: Secondary | ICD-10-CM | POA: Diagnosis present

## 2018-09-19 DIAGNOSIS — F17210 Nicotine dependence, cigarettes, uncomplicated: Secondary | ICD-10-CM | POA: Diagnosis present

## 2018-09-19 DIAGNOSIS — I252 Old myocardial infarction: Secondary | ICD-10-CM

## 2018-09-19 DIAGNOSIS — K7040 Alcoholic hepatic failure without coma: Secondary | ICD-10-CM | POA: Diagnosis present

## 2018-09-19 DIAGNOSIS — Z515 Encounter for palliative care: Secondary | ICD-10-CM | POA: Diagnosis not present

## 2018-09-19 DIAGNOSIS — Z66 Do not resuscitate: Secondary | ICD-10-CM | POA: Diagnosis not present

## 2018-09-19 DIAGNOSIS — N179 Acute kidney failure, unspecified: Secondary | ICD-10-CM

## 2018-09-19 DIAGNOSIS — A498 Other bacterial infections of unspecified site: Secondary | ICD-10-CM

## 2018-09-19 DIAGNOSIS — M009 Pyogenic arthritis, unspecified: Secondary | ICD-10-CM | POA: Diagnosis present

## 2018-09-19 DIAGNOSIS — R0600 Dyspnea, unspecified: Secondary | ICD-10-CM

## 2018-09-19 DIAGNOSIS — Z8249 Family history of ischemic heart disease and other diseases of the circulatory system: Secondary | ICD-10-CM

## 2018-09-19 DIAGNOSIS — D696 Thrombocytopenia, unspecified: Secondary | ICD-10-CM

## 2018-09-19 DIAGNOSIS — E876 Hypokalemia: Secondary | ICD-10-CM | POA: Diagnosis present

## 2018-09-19 DIAGNOSIS — D638 Anemia in other chronic diseases classified elsewhere: Secondary | ICD-10-CM | POA: Diagnosis present

## 2018-09-19 DIAGNOSIS — Z1159 Encounter for screening for other viral diseases: Secondary | ICD-10-CM

## 2018-09-19 DIAGNOSIS — Z9119 Patient's noncompliance with other medical treatment and regimen: Secondary | ICD-10-CM

## 2018-09-19 DIAGNOSIS — R1115 Cyclical vomiting syndrome unrelated to migraine: Secondary | ICD-10-CM

## 2018-09-19 DIAGNOSIS — F1110 Opioid abuse, uncomplicated: Secondary | ICD-10-CM | POA: Diagnosis present

## 2018-09-19 DIAGNOSIS — D61818 Other pancytopenia: Secondary | ICD-10-CM | POA: Diagnosis present

## 2018-09-19 DIAGNOSIS — F10239 Alcohol dependence with withdrawal, unspecified: Secondary | ICD-10-CM | POA: Diagnosis not present

## 2018-09-19 DIAGNOSIS — D649 Anemia, unspecified: Secondary | ICD-10-CM

## 2018-09-19 DIAGNOSIS — R1311 Dysphagia, oral phase: Secondary | ICD-10-CM | POA: Diagnosis present

## 2018-09-19 DIAGNOSIS — J690 Pneumonitis due to inhalation of food and vomit: Secondary | ICD-10-CM

## 2018-09-19 DIAGNOSIS — K7030 Alcoholic cirrhosis of liver without ascites: Secondary | ICD-10-CM | POA: Diagnosis present

## 2018-09-19 DIAGNOSIS — T8453XA Infection and inflammatory reaction due to internal right knee prosthesis, initial encounter: Secondary | ICD-10-CM | POA: Diagnosis present

## 2018-09-19 DIAGNOSIS — Z8673 Personal history of transient ischemic attack (TIA), and cerebral infarction without residual deficits: Secondary | ICD-10-CM

## 2018-09-19 DIAGNOSIS — M0080 Arthritis due to other bacteria, unspecified joint: Secondary | ICD-10-CM

## 2018-09-19 DIAGNOSIS — F1123 Opioid dependence with withdrawal: Secondary | ICD-10-CM | POA: Diagnosis not present

## 2018-09-19 DIAGNOSIS — N183 Chronic kidney disease, stage 3 (moderate): Secondary | ICD-10-CM | POA: Diagnosis present

## 2018-09-19 MED ORDER — CYCLOBENZAPRINE HCL 10 MG PO TABS
10.0000 mg | ORAL_TABLET | Freq: Once | ORAL | Status: AC
  Administered 2018-09-19: 10 mg via ORAL
  Filled 2018-09-19: qty 1

## 2018-09-19 MED ORDER — OXYCODONE HCL 5 MG PO TABS
5.0000 mg | ORAL_TABLET | Freq: Once | ORAL | Status: AC
  Administered 2018-09-19: 5 mg via ORAL
  Filled 2018-09-19: qty 1

## 2018-09-19 NOTE — ED Provider Notes (Signed)
[name] EMERGENCY DEPARTMENT Provider Note   CSN: [id number] Arrival date & time: 09/19/18  2003    History   Chief Complaint Chief Complaint  Patient presents with  . Back Pain    HPI Mark [name] is a 63 y.o. male.   The history is provided by the patient.  Back Pain  He has history of hypertension, hyperlipidemia, cirrhosis, stroke, coronary artery disease and comes in complaining of low back pain.  He states he has had pain for years, but it has been getting worse over the last month.  He works as a Dealer and, while he does not have to do any lifting, he does have to get into awkward positions.  Pain is across the lumbar area without radiation to the hips or legs.  He denies any weakness, numbness, tingling.  He denies any bowel or bladder dysfunction.  He has taken naproxen without relief.  He states he has told his physician about his back pain and he has been told that there is nothing wrong.  Currently, pain is rated at 5/10.  It will sometimes get as severe as 10/10.  Of note, he does admit to continuing to drink 24 ounces of beer a day.  Past Medical History:  Diagnosis Date  . Alcohol abuse   . Anemia   . Arthritis   . Ascites   . Cirrhosis ([place])   . Coffee ground emesis   . Dehydration 06/17/2017  . Febrile illness   . Heart murmur   . Hyperlipidemia   . Hypertension   . Leg swelling   . Myocardial infarction ([place]) 2012  . Preop cardiovascular exam 04/14/2013  . Sepsis ([place]) 06/17/2017  . Septic shock ([place]) 06/18/2017  . SIRS (systemic inflammatory response syndrome) ([place]) 07/11/2017  . Stroke [facility]) 2012   no deficits  . Thrombocytopenia [facility])     Patient Active Problem List   Diagnosis Date Noted  . Hyperkalemia 05/18/2018  . MSSA bacteremia 05/11/2018  . Edentulous 05/11/2018  . Pancytopenia ([place]) 05/10/2018  . CAD (coronary artery disease) 05/10/2018  . Hepatic encephalopathy ([place]) 05/10/2018  . Alcohol abuse  05/10/2018  . GERD (gastroesophageal reflux disease) 05/10/2018  . Duodenal ulcer   . Renal failure   . Acute on chronic anemia   . Hypotension 06/17/2017  . Acute on chronic renal failure ([place]) 06/17/2017  . Hyponatremia 06/17/2017  . Thrombocytopenia ([place]) 06/17/2017  . Leg edema, right 06/17/2017  . Acute metabolic encephalopathy 15/17/6160  . Anemia, iron deficiency   . Benign neoplasm of ascending colon   . Hemorrhoids   . Portal hypertensive gastropathy ([place])   . Gastritis and gastroduodenitis   . Esophageal varices without bleeding ([place])   . H/O: CVA (cerebrovascular accident) 12/01/2013  . ACS (acute coronary syndrome) ([place]) 12/01/2013  . Anasarca 12/01/2013  . Substance abuse ([place]) 12/01/2013  . CKD (chronic kidney disease) stage 3, GFR 30-59 ml/min ([name]) 12/01/2013  . S/P right TKA 05/02/2013  . Murmur 04/14/2013  . Right inguinal hernia 12/26/2010  . Cirrhosis, alcoholic ([place]) 73/71/0626    Past Surgical History:  Procedure Laterality Date  . COLONOSCOPY WITH PROPOFOL N/A 02/07/2016   Procedure: COLONOSCOPY WITH PROPOFOL;  Surgeon: Milus Banister, MD;  Location: [facility] ENDOSCOPY;  Service: Endoscopy;  Laterality: N/A;  . ESOPHAGOGASTRODUODENOSCOPY (EGD) WITH PROPOFOL N/A 02/07/2016   Procedure: ESOPHAGOGASTRODUODENOSCOPY (EGD) WITH PROPOFOL;  Surgeon: Milus Banister, MD;  Location: [facility] ENDOSCOPY;  Service: Endoscopy;  Laterality: N/A;  .  ESOPHAGOGASTRODUODENOSCOPY (EGD) WITH PROPOFOL N/A 06/22/2017   Procedure: ESOPHAGOGASTRODUODENOSCOPY (EGD) WITH PROPOFOL;  Surgeon: Jerene Bears, MD;  Location: [facility];  Service: Gastroenterology;  Laterality: N/A;  . HERNIA REPAIR Right    inguinal  . KNEE ARTHROSCOPY     bilateral/  12/14  . TEE WITHOUT CARDIOVERSION N/A 05/13/2018   Procedure: TRANSESOPHAGEAL ECHOCARDIOGRAM (TEE);  Surgeon: [name], MD;  Location: [facility] ENDOSCOPY;  Service: Cardiovascular;  Laterality: N/A;  . TOTAL KNEE ARTHROPLASTY Right 05/02/2013    Procedure: RIGHT TOTAL KNEE ARTHROPLASTY;  Surgeon: Mauri Pole, MD;  Location: [facility] ORS;  Service: Orthopedics;  Laterality: Right;        Home Medications    Prior to Admission medications   Medication Sig Start Date End Date Taking? Authorizing Provider  albuterol (PROVENTIL HFA;VENTOLIN HFA) 108 (90 Base) MCG/ACT inhaler Inhale 2 puffs into the lungs every 6 (six) hours as needed for wheezing or shortness of breath. Patient not taking: Reported on 06/03/2018 09/16/17   [name], MD  diphenhydrAMINE-zinc acetate (BENADRYL EXTRA STRENGTH) cream Apply 1 application topically 3 (three) times daily as needed for itching. Patient not taking: Reported on 06/03/2018 04/25/18   Domenic Moras, PA-C  folic acid (FOLVITE) 1 MG tablet TAKE 1 TABLET BY MOUTH EVERY DAY 07/23/18   [name], MD  furosemide (LASIX) 20 MG tablet TAKE 1 TABLET BY MOUTH TWICE DAILY 07/23/18   [name], MD  guaiFENesin (MUCINEX) 600 MG 12 hr tablet Take 1 tablet (600 mg total) by mouth 2 (two) times daily as needed for cough or to loosen phlegm. Patient not taking: Reported on 06/03/2018 07/14/17   Rosita Fire, MD  ketoconazole (NIZORAL) 2 % shampoo Apply 1 application topically 2 (two) times a week. 08/23/18   Kinnie Feil, PA-C  lactulose (CHRONULAC) 10 GM/15ML solution TAKE 45 MILLILITERS BY MOUTH TWICE DAILY 08/26/18   [name], MD  nadolol (CORGARD) 20 MG tablet TAKE 1 TABLET BY MOUTH EVERY DAY 08/26/18   [name], MD  pantoprazole (PROTONIX) 40 MG tablet TAKE 1 TABLET(40 MG) BY MOUTH DAILY Patient taking differently: Take 40 mg by mouth daily.  03/03/18   [name], MD  silver sulfADIAZINE (SILVADENE) 1 % cream Apply 1 application topically daily as needed. To  Burn Patient not taking: Reported on 06/03/2018 05/14/18   Aline August, MD  spironolactone (ALDACTONE) 50 MG tablet Take 1 tablet (50 mg total) by mouth 2 (two) times daily. 05/21/18   Oswald Hillock, MD  thiamine  100 MG tablet Take 1 tablet (100 mg total) by mouth daily. Patient not taking: Reported on 06/03/2018 12/04/17   [name], MD  triamcinolone (KENALOG) 0.025 % ointment Apply 1 application topically 2 (two) times daily as needed (rash). 05/14/18   Aline August, MD    Family History Family History  Problem Relation Age of Onset  . Hypertension Father   . Diabetes Father   . Dementia Mother   . Lupus Sister     Social History Social History   Tobacco Use  . Smoking status: Former Smoker    Packs/day: 0.50    Years: 10.00    Pack years: 5.00    Types: Cigarettes    Last attempt to quit: 01/05/2016    Years since quitting: 2.7  . Smokeless tobacco: Never Used  . Tobacco comment: 4 cigarettes a day  Substance Use Topics  . Alcohol use: Yes  . Drug use:  Yes    Types: Marijuana, Heroin     Allergies   Patient has no known allergies.   Review of Systems Review of Systems  Musculoskeletal: Positive for back pain.  All other systems reviewed and are negative.    Physical Exam Updated Vital Signs BP (!) 104/56 (BP Location: Right Arm)   Pulse 76   Temp 97.6 F (36.4 C) (Oral)   Resp 14   Ht 5\' 6"  (1.676 m)   Wt 61.2 kg   SpO2 95%   BMI 21.79 kg/m   Physical Exam Vitals signs and nursing note reviewed.    63 year old male, resting comfortably and in no acute distress. Vital signs are normal. Oxygen saturation is 95%, which is normal. Head is normocephalic and atraumatic. PERRLA, EOMI. Oropharynx is clear. Neck is nontender and supple without adenopathy or JVD. Back has 1+ presacral edema.  There is mild tenderness in the lower lumbar area.  There is mild to moderate bilateral paralumbar spasm.  Straight leg raise is negative.  There is no CVA tenderness. Lungs are clear without rales, wheezes, or rhonchi. Chest is nontender. Heart has regular rate and rhythm without murmur. Abdomen is soft, flat, nontender without masses or hepatosplenomegaly and peristalsis  is normoactive. Extremities have 1+ edema, full range of motion is present. Skin is warm and dry without rash. Neurologic: Mental status is normal, cranial nerves are intact, there are no motor or sensory deficits.  ED Treatments / Results   Radiology Mr Lumbar Spine W Wo Contrast  Result Date: 09/20/2018 CLINICAL DATA:  Initial evaluation for persistent low back pain for several weeks. EXAM: MRI LUMBAR SPINE WITHOUT AND WITH CONTRAST TECHNIQUE: Multiplanar and multiecho pulse sequences of the lumbar spine were obtained without and with intravenous contrast. CONTRAST:  60 cc of Gadavist. COMPARISON:  None available. FINDINGS: Segmentation: Standard. Lowest well-formed disc labeled the L5-S1 level. Alignment: Vertebral bodies normally aligned with preservation of the normal lumbar lordosis. No listhesis or subluxation. Vertebrae: Vertebral body heights maintained without evidence for acute or chronic fracture. Bone marrow signal intensity diffusely heterogeneous without discrete or worrisome osseous lesions. Reactive endplate changes present about the L3-4, L4-5, and L5-S1 interspaces. Fluid density present within the right aspect of the L4-5 interspace without significant endplate irregularity or enhancement, favored to be degenerative. Mild edema and enhancement about the L5-S1 interspace also favored to be degenerative. There is abnormal edema and enhancement about the L5-S1 facets, most notable on the right. Associated small bilateral joint effusions. While these findings could be degenerative in nature, possible septic arthritis should be considered. Possible involvement at L4-5 on the right as well. Conus medullaris and cauda equina: Conus extends to the L2 level. Conus medullaris within normal limits. There is abnormal epidural enhancement involving the lower lumbar spinal canal, primarily extending from L3 through the sacrum. Associated epidural collection within the ventral epidural space most  likely reflects abscess and/or phlegmon. This measures approximately 0.6 x 1.9 x 4.4 cm in greatest dimensions (AP by transverse by craniocaudad), most prominent at the L4 and L5 levels (series 14, image 8). Additional component seen posteriorly within the dorsal epidural space, most notable at L5-S1 on the right (series 15, image 23). Probable associated subdural and/or intradural effusion with mild displacement of the distal nerve roots of the cauda equina at the level of L2-3 (series 8, image 12). Paraspinal and other soft tissues: Mild soft tissue edema and enhancement adjacent to the right L4-5 and L5-S1 facets. Similarly, edema and  enhancement present within the right psoas muscle extending from L3-4 through L5-S1, consistent with infection. No discrete soft tissue abscess or drainable fluid collection. Disc levels: L1-2: Negative interspace. Mild bilateral facet hypertrophy. No canal or foraminal stenosis. L2-3: Negative interspace. Mild bilateral facet and ligament flavum hypertrophy. Mild concentric epidural phlegmon/abscess with probable subdural effusion. Nerve roots of the cauda equina mildly compressed (series 8, image 12). Thecal sac remains patent. No foraminal stenosis. L3-4: Chronic intervertebral disc space narrowing with diffuse disc bulge and disc desiccation. Mild reactive endplate changes. Moderate facet and ligament flavum hypertrophy. Concentric epidural phlegmon/abscess. Resultant moderate spinal stenosis. Mild bilateral L3 foraminal narrowing. L4-5: Chronic intervertebral disc space narrowing with diffuse disc bulge and disc desiccation. Mild reactive endplate changes. Moderate facet and ligament flavum hypertrophy. Associated bilateral joint effusions. Epidural phlegmon/abscess, most notable within the ventral epidural space at the level of the left lateral recesses at the level of L4 and L5 (series 8, images 21, 25). Resultant severe canal with left worse than right lateral recess  stenosis. Moderate bilateral L4 foraminal narrowing, right worse than left. L5-S1: Chronic intervertebral disc space narrowing with disc desiccation and diffuse disc bulge. Chronic reactive endplate changes with marginal endplate osteophytic spurring. Moderate to advanced right worse than left facet degeneration. Bilateral joint effusions, with concern for possible septic arthritis, right greater than left. Epidural phlegmon/abscess, most notable at the left lateral recess and right dorsal epidural space. Moderate to severe canal and bilateral foraminal stenosis. IMPRESSION: 1. Abnormal enhancement throughout the epidural space extending from approximately L3 through the sacrum, concerning for infection. Superimposed epidural phlegmon/abscess as above, most notable within the ventral epidural space at the levels of L4 and L5. 2. Abnormal marrow edema and enhancement about the right greater than left L5-S1 facets, suggesting septic arthritis. Finding could be source of infection. 3. Associated moderate to severe spinal stenosis at L3-4 through L5-S1, with moderate to advanced bilateral L4 and L5 foraminal stenosis as above. 4. Associated lower right paraspinous soft tissue edema and enhancement without discrete abscess or drainable fluid collection. Electronically Signed   By: Jeannine Boga M.D.   On: 09/20/2018 06:58    Procedures Procedures   Medications Ordered in ED Medications  vancomycin (VANCOCIN) IVPB 1000 mg/200 mL premix (has no administration in time range)  piperacillin-tazobactam (ZOSYN) IVPB 3.375 g (has no administration in time range)  cyclobenzaprine (FLEXERIL) tablet 10 mg (10 mg Oral Given 09/19/18 2346)  oxyCODONE (Oxy IR/ROXICODONE) immediate release tablet 5 mg (5 mg Oral Given 09/19/18 2344)  gadobutrol (GADAVIST) 1 MMOL/ML injection 6 mL (6 mLs Intravenous Contrast Given 09/20/18 0613)  oxyCODONE (Oxy IR/ROXICODONE) immediate release tablet 5 mg (5 mg Oral Given 09/20/18 0645)      Initial Impression / Assessment and Plan / ED Course  I have reviewed the triage vital signs and the nursing notes.  Pertinent imaging results that were available during my care of the patient were reviewed by me and considered in my medical decision making (see chart for details).  Low back pain.  No red flags to suggest serious pathology.  Old records are reviewed, and he has no dedicated back imaging, but did have a CT of his abdomen and pelvis in February 2020 which showed she is significant degenerative changes in the lumbar spine, no evidence of abdominal aneurysm.  Unfortunately, with his cirrhosis, he cannot use anything with acetaminophen.  He also has a history of hematemesis, so I am reluctant to use any NSAIDs.  Will send for  MRI to define his anatomy.  He is given a dose of cyclobenzaprine and oxycodone.  He had good relief with above-noted treatment.  MRI showed some epidural fluid collections that were concerning for infection and radiologist requested he be sent back for an MRI with contrast.  MRI with contrast is still concerning for epidural abscess and septic arthritis.  Blood cultures were obtained and is started on antibiotics.  He will need to be admitted.  Screening labs are obtained.  Case is signed out to Dr. Ashok Cordia.  Final Clinical Impressions(s) / ED Diagnoses   Final diagnoses:  Acute exacerbation of chronic low back pain    ED Discharge Orders    None       Delora Fuel, MD 23/70/23 [phone]

## 2018-09-19 NOTE — ED Triage Notes (Signed)
Pt in POV, reports back pain X several weeks. Denies injury. Has tried OTC meds with no relief. Ambulatory.

## 2018-09-20 ENCOUNTER — Encounter (HOSPITAL_COMMUNITY): Payer: Self-pay | Admitting: *Deleted

## 2018-09-20 ENCOUNTER — Emergency Department (HOSPITAL_COMMUNITY): Payer: Medicare Other

## 2018-09-20 DIAGNOSIS — E871 Hypo-osmolality and hyponatremia: Secondary | ICD-10-CM

## 2018-09-20 DIAGNOSIS — F1910 Other psychoactive substance abuse, uncomplicated: Secondary | ICD-10-CM | POA: Diagnosis present

## 2018-09-20 DIAGNOSIS — Z833 Family history of diabetes mellitus: Secondary | ICD-10-CM | POA: Diagnosis not present

## 2018-09-20 DIAGNOSIS — D61818 Other pancytopenia: Secondary | ICD-10-CM

## 2018-09-20 DIAGNOSIS — Z8673 Personal history of transient ischemic attack (TIA), and cerebral infarction without residual deficits: Secondary | ICD-10-CM | POA: Diagnosis not present

## 2018-09-20 DIAGNOSIS — G062 Extradural and subdural abscess, unspecified: Secondary | ICD-10-CM | POA: Diagnosis present

## 2018-09-20 DIAGNOSIS — I361 Nonrheumatic tricuspid (valve) insufficiency: Secondary | ICD-10-CM | POA: Diagnosis not present

## 2018-09-20 DIAGNOSIS — Z66 Do not resuscitate: Secondary | ICD-10-CM | POA: Diagnosis not present

## 2018-09-20 DIAGNOSIS — D638 Anemia in other chronic diseases classified elsewhere: Secondary | ICD-10-CM | POA: Diagnosis present

## 2018-09-20 DIAGNOSIS — E8809 Other disorders of plasma-protein metabolism, not elsewhere classified: Secondary | ICD-10-CM | POA: Diagnosis present

## 2018-09-20 DIAGNOSIS — K7290 Hepatic failure, unspecified without coma: Secondary | ICD-10-CM | POA: Diagnosis not present

## 2018-09-20 DIAGNOSIS — Z8249 Family history of ischemic heart disease and other diseases of the circulatory system: Secondary | ICD-10-CM | POA: Diagnosis not present

## 2018-09-20 DIAGNOSIS — Z9119 Patient's noncompliance with other medical treatment and regimen: Secondary | ICD-10-CM | POA: Diagnosis not present

## 2018-09-20 DIAGNOSIS — F1123 Opioid dependence with withdrawal: Secondary | ICD-10-CM | POA: Diagnosis not present

## 2018-09-20 DIAGNOSIS — M4656 Other infective spondylopathies, lumbar region: Secondary | ICD-10-CM | POA: Diagnosis not present

## 2018-09-20 DIAGNOSIS — N179 Acute kidney failure, unspecified: Secondary | ICD-10-CM | POA: Diagnosis present

## 2018-09-20 DIAGNOSIS — I2510 Atherosclerotic heart disease of native coronary artery without angina pectoris: Secondary | ICD-10-CM | POA: Diagnosis present

## 2018-09-20 DIAGNOSIS — M009 Pyogenic arthritis, unspecified: Secondary | ICD-10-CM | POA: Diagnosis not present

## 2018-09-20 DIAGNOSIS — E162 Hypoglycemia, unspecified: Secondary | ICD-10-CM | POA: Diagnosis present

## 2018-09-20 DIAGNOSIS — Z96651 Presence of right artificial knee joint: Secondary | ICD-10-CM | POA: Diagnosis present

## 2018-09-20 DIAGNOSIS — J690 Pneumonitis due to inhalation of food and vomit: Secondary | ICD-10-CM | POA: Diagnosis not present

## 2018-09-20 DIAGNOSIS — B9562 Methicillin resistant Staphylococcus aureus infection as the cause of diseases classified elsewhere: Secondary | ICD-10-CM | POA: Diagnosis present

## 2018-09-20 DIAGNOSIS — R7881 Bacteremia: Secondary | ICD-10-CM | POA: Diagnosis present

## 2018-09-20 DIAGNOSIS — G8929 Other chronic pain: Secondary | ICD-10-CM | POA: Diagnosis present

## 2018-09-20 DIAGNOSIS — F1010 Alcohol abuse, uncomplicated: Secondary | ICD-10-CM | POA: Diagnosis not present

## 2018-09-20 DIAGNOSIS — K7030 Alcoholic cirrhosis of liver without ascites: Secondary | ICD-10-CM | POA: Diagnosis present

## 2018-09-20 DIAGNOSIS — E872 Acidosis: Secondary | ICD-10-CM | POA: Diagnosis present

## 2018-09-20 DIAGNOSIS — D696 Thrombocytopenia, unspecified: Secondary | ICD-10-CM | POA: Diagnosis not present

## 2018-09-20 DIAGNOSIS — M48061 Spinal stenosis, lumbar region without neurogenic claudication: Secondary | ICD-10-CM | POA: Diagnosis not present

## 2018-09-20 DIAGNOSIS — Z1159 Encounter for screening for other viral diseases: Secondary | ICD-10-CM | POA: Diagnosis not present

## 2018-09-20 DIAGNOSIS — Z515 Encounter for palliative care: Secondary | ICD-10-CM | POA: Diagnosis not present

## 2018-09-20 DIAGNOSIS — I129 Hypertensive chronic kidney disease with stage 1 through stage 4 chronic kidney disease, or unspecified chronic kidney disease: Secondary | ICD-10-CM | POA: Diagnosis present

## 2018-09-20 DIAGNOSIS — G061 Intraspinal abscess and granuloma: Secondary | ICD-10-CM | POA: Diagnosis present

## 2018-09-20 DIAGNOSIS — I340 Nonrheumatic mitral (valve) insufficiency: Secondary | ICD-10-CM | POA: Diagnosis not present

## 2018-09-20 DIAGNOSIS — T8453XA Infection and inflammatory reaction due to internal right knee prosthesis, initial encounter: Secondary | ICD-10-CM | POA: Diagnosis present

## 2018-09-20 DIAGNOSIS — N183 Chronic kidney disease, stage 3 (moderate): Secondary | ICD-10-CM | POA: Diagnosis present

## 2018-09-20 DIAGNOSIS — M0008 Staphylococcal arthritis, vertebrae: Secondary | ICD-10-CM | POA: Diagnosis present

## 2018-09-20 DIAGNOSIS — F10239 Alcohol dependence with withdrawal, unspecified: Secondary | ICD-10-CM | POA: Diagnosis not present

## 2018-09-20 LAB — CBC WITH DIFFERENTIAL/PLATELET
Abs Immature Granulocytes: 0.02 10*3/uL (ref 0.00–0.07)
Basophils Absolute: 0 10*3/uL (ref 0.0–0.1)
Basophils Relative: 0 %
Eosinophils Absolute: 0.3 10*3/uL (ref 0.0–0.5)
Eosinophils Relative: 6 %
HCT: 28.2 % — ABNORMAL LOW (ref 39.0–52.0)
Hemoglobin: 9.1 g/dL — ABNORMAL LOW (ref 13.0–17.0)
Immature Granulocytes: 0 %
Lymphocytes Relative: 5 %
Lymphs Abs: 0.3 10*3/uL — ABNORMAL LOW (ref 0.7–4.0)
MCH: 31.7 pg (ref 26.0–34.0)
MCHC: 32.3 g/dL (ref 30.0–36.0)
MCV: 98.3 fL (ref 80.0–100.0)
Monocytes Absolute: 0.3 10*3/uL (ref 0.1–1.0)
Monocytes Relative: 5 %
Neutro Abs: 4.1 10*3/uL (ref 1.7–7.7)
Neutrophils Relative %: 84 %
Platelets: DECREASED 10*3/uL (ref 150–400)
RBC: 2.87 MIL/uL — ABNORMAL LOW (ref 4.22–5.81)
RDW: 18 % — ABNORMAL HIGH (ref 11.5–15.5)
WBC: 4.9 10*3/uL (ref 4.0–10.5)
nRBC: 0 % (ref 0.0–0.2)

## 2018-09-20 LAB — GLUCOSE, CAPILLARY
Glucose-Capillary: 52 mg/dL — ABNORMAL LOW (ref 70–99)
Glucose-Capillary: 77 mg/dL (ref 70–99)
Glucose-Capillary: 68 mg/dL — ABNORMAL LOW (ref 70–99)
Glucose-Capillary: 75 mg/dL (ref 70–99)

## 2018-09-20 LAB — LACTIC ACID, PLASMA
Lactic Acid, Venous: 2.1 mmol/L (ref 0.5–1.9)
Lactic Acid, Venous: 2.8 mmol/L (ref 0.5–1.9)
Lactic Acid, Venous: 2.6 mmol/L (ref 0.5–1.9)

## 2018-09-20 LAB — COMPREHENSIVE METABOLIC PANEL
ALT: 15 U/L (ref 0–44)
AST: 34 U/L (ref 15–41)
Albumin: 1.7 g/dL — ABNORMAL LOW (ref 3.5–5.0)
Alkaline Phosphatase: 120 U/L (ref 38–126)
Anion gap: 11 (ref 5–15)
BUN: 26 mg/dL — ABNORMAL HIGH (ref 8–23)
CO2: 18 mmol/L — ABNORMAL LOW (ref 22–32)
Calcium: 8.1 mg/dL — ABNORMAL LOW (ref 8.9–10.3)
Chloride: 101 mmol/L (ref 98–111)
Creatinine, Ser: 1.82 mg/dL — ABNORMAL HIGH (ref 0.61–1.24)
GFR calc Af Amer: 44 mL/min — ABNORMAL LOW (ref >60)
GFR calc non Af Amer: 38 mL/min — ABNORMAL LOW (ref >60)
Glucose, Bld: 56 mg/dL — ABNORMAL LOW (ref 70–99)
Potassium: 4.2 mmol/L (ref 3.5–5.1)
Sodium: 130 mmol/L — ABNORMAL LOW (ref 135–145)
Total Bilirubin: 1.8 mg/dL — ABNORMAL HIGH (ref 0.3–1.2)
Total Protein: 6 g/dL — ABNORMAL LOW (ref 6.5–8.1)

## 2018-09-20 LAB — TYPE AND SCREEN
ABO/RH(D): B POS
Antibody Screen: NEGATIVE

## 2018-09-20 LAB — AMMONIA: Ammonia: 111 umol/L — ABNORMAL HIGH (ref 9–35)

## 2018-09-20 LAB — SARS CORONAVIRUS 2 BY RT PCR (HOSPITAL ORDER, PERFORMED IN ~~LOC~~ HOSPITAL LAB): SARS Coronavirus 2: NEGATIVE

## 2018-09-20 LAB — SEDIMENTATION RATE: Sed Rate: 55 mm/hr — ABNORMAL HIGH (ref 0–16)

## 2018-09-20 LAB — CBG MONITORING, ED: Glucose-Capillary: 65 mg/dL — ABNORMAL LOW (ref 70–99)

## 2018-09-20 LAB — C-REACTIVE PROTEIN: CRP: 18.9 mg/dL — ABNORMAL HIGH (ref <1.0)

## 2018-09-20 MED ORDER — LACTULOSE 10 GM/15ML PO SOLN
30.0000 g | Freq: Two times a day (BID) | ORAL | Status: DC
  Administered 2018-09-20 – 2018-09-21 (×3): 30 g via ORAL
  Administered 2018-09-21: 10 g via ORAL
  Administered 2018-09-22 (×2): 30 g via ORAL
  Filled 2018-09-20 (×6): qty 45

## 2018-09-20 MED ORDER — PIPERACILLIN-TAZOBACTAM 3.375 G IVPB 30 MIN
3.3750 g | Freq: Once | INTRAVENOUS | Status: AC
  Administered 2018-09-20: 3.375 g via INTRAVENOUS
  Filled 2018-09-20: qty 50

## 2018-09-20 MED ORDER — LORAZEPAM 2 MG/ML IJ SOLN
0.0000 mg | Freq: Two times a day (BID) | INTRAMUSCULAR | Status: DC
  Administered 2018-09-23: 2 mg via INTRAVENOUS
  Filled 2018-09-20: qty 1

## 2018-09-20 MED ORDER — LORAZEPAM 2 MG/ML IJ SOLN: 1.0000 mg | Freq: Four times a day (QID) | INTRAMUSCULAR | Status: DC | PRN

## 2018-09-20 MED ORDER — NADOLOL 20 MG PO TABS
20.0000 mg | ORAL_TABLET | Freq: Every day | ORAL | Status: DC
  Administered 2018-09-20 – 2018-09-22 (×3): 20 mg via ORAL
  Filled 2018-09-20 (×5): qty 1

## 2018-09-20 MED ORDER — ONDANSETRON HCL 4 MG/2ML IJ SOLN
4.0000 mg | Freq: Four times a day (QID) | INTRAMUSCULAR | Status: DC | PRN
  Administered 2018-09-30: 4 mg via INTRAVENOUS
  Filled 2018-09-20: qty 2

## 2018-09-20 MED ORDER — DEXTROSE 50 % IV SOLN
25.0000 g | Freq: Once | INTRAVENOUS | Status: AC
  Administered 2018-09-20: 25 g via INTRAVENOUS
  Filled 2018-09-20: qty 50

## 2018-09-20 MED ORDER — LORAZEPAM 1 MG PO TABS
1.0000 mg | ORAL_TABLET | Freq: Four times a day (QID) | ORAL | Status: DC | PRN
  Administered 2018-09-21: 1 mg via ORAL
  Filled 2018-09-20 (×2): qty 1

## 2018-09-20 MED ORDER — PIPERACILLIN-TAZOBACTAM 3.375 G IVPB 30 MIN: 3.3750 g | Freq: Once | INTRAVENOUS | Status: DC

## 2018-09-20 MED ORDER — VANCOMYCIN HCL IN DEXTROSE 1-5 GM/200ML-% IV SOLN
1000.0000 mg | Freq: Once | INTRAVENOUS | Status: AC
  Administered 2018-09-20: 1000 mg via INTRAVENOUS
  Filled 2018-09-20: qty 200

## 2018-09-20 MED ORDER — ONDANSETRON HCL 4 MG PO TABS: 4.0000 mg | ORAL_TABLET | Freq: Four times a day (QID) | ORAL | Status: DC | PRN

## 2018-09-20 MED ORDER — ADULT MULTIVITAMIN W/MINERALS CH
1.0000 | ORAL_TABLET | Freq: Every day | ORAL | Status: DC
  Administered 2018-09-20 – 2018-10-04 (×12): 1 via ORAL
  Filled 2018-09-20 (×14): qty 1

## 2018-09-20 MED ORDER — DEXTROSE 10 % IV SOLN
INTRAVENOUS | Status: DC
  Administered 2018-09-20 – 2018-09-21 (×3): via INTRAVENOUS

## 2018-09-20 MED ORDER — DEXTROSE-NACL 5-0.45 % IV SOLN
INTRAVENOUS | Status: DC
  Administered 2018-09-20: 15:00:00 via INTRAVENOUS

## 2018-09-20 MED ORDER — VITAMIN B-1 100 MG PO TABS
100.0000 mg | ORAL_TABLET | Freq: Every day | ORAL | Status: DC
  Administered 2018-09-20 – 2018-10-04 (×13): 100 mg via ORAL
  Filled 2018-09-20 (×14): qty 1

## 2018-09-20 MED ORDER — LORAZEPAM 2 MG/ML IJ SOLN
0.0000 mg | Freq: Four times a day (QID) | INTRAMUSCULAR | Status: DC
  Filled 2018-09-20 (×2): qty 1

## 2018-09-20 MED ORDER — ACETAMINOPHEN 650 MG RE SUPP: 650.0000 mg | Freq: Four times a day (QID) | RECTAL | Status: DC | PRN

## 2018-09-20 MED ORDER — PIPERACILLIN-TAZOBACTAM 3.375 G IVPB: 3.3750 g | Freq: Three times a day (TID) | INTRAVENOUS | Status: DC

## 2018-09-20 MED ORDER — VANCOMYCIN HCL 10 G IV SOLR
1250.0000 mg | Freq: Once | INTRAVENOUS | Status: DC
  Filled 2018-09-20: qty 1250

## 2018-09-20 MED ORDER — VANCOMYCIN HCL IN DEXTROSE 750-5 MG/150ML-% IV SOLN: 750.0000 mg | INTRAVENOUS | Status: DC

## 2018-09-20 MED ORDER — OXYCODONE HCL 5 MG PO TABS
5.0000 mg | ORAL_TABLET | Freq: Once | ORAL | Status: AC
  Administered 2018-09-20: 5 mg via ORAL
  Filled 2018-09-20: qty 1

## 2018-09-20 MED ORDER — SODIUM CHLORIDE 0.9% FLUSH
3.0000 mL | Freq: Two times a day (BID) | INTRAVENOUS | Status: DC
  Administered 2018-09-21: 3 mL via INTRAVENOUS

## 2018-09-20 MED ORDER — SODIUM CHLORIDE 0.9 % IV BOLUS
1000.0000 mL | Freq: Once | INTRAVENOUS | Status: AC
  Administered 2018-09-20: 1000 mL via INTRAVENOUS

## 2018-09-20 MED ORDER — GADOBUTROL 1 MMOL/ML IV SOLN
6.0000 mL | Freq: Once | INTRAVENOUS | Status: AC | PRN
  Administered 2018-09-20: 6 mL via INTRAVENOUS

## 2018-09-20 MED ORDER — VANCOMYCIN HCL 500 MG IV SOLR
500.0000 mg | Freq: Once | INTRAVENOUS | Status: AC
  Administered 2018-09-20: 500 mg via INTRAVENOUS
  Filled 2018-09-20: qty 500

## 2018-09-20 MED ORDER — ACETAMINOPHEN 325 MG PO TABS
650.0000 mg | ORAL_TABLET | Freq: Four times a day (QID) | ORAL | Status: DC | PRN
  Administered 2018-09-20 – 2018-09-27 (×4): 650 mg via ORAL
  Filled 2018-09-20 (×5): qty 2

## 2018-09-20 MED ORDER — DEXTROSE 50 % IV SOLN
25.0000 g | INTRAVENOUS | Status: DC | PRN
  Administered 2018-09-20 – 2018-09-23 (×2): 25 g via INTRAVENOUS
  Filled 2018-09-20 (×4): qty 50

## 2018-09-20 MED ORDER — ALBUTEROL SULFATE (2.5 MG/3ML) 0.083% IN NEBU: 2.5000 mg | INHALATION_SOLUTION | Freq: Four times a day (QID) | RESPIRATORY_TRACT | Status: DC | PRN

## 2018-09-20 MED ORDER — CYCLOBENZAPRINE HCL 10 MG PO TABS: 10.0000 mg | ORAL_TABLET | Freq: Three times a day (TID) | ORAL | 0 refills | Status: DC | PRN

## 2018-09-20 MED ORDER — PIPERACILLIN-TAZOBACTAM 3.375 G IVPB
3.3750 g | Freq: Three times a day (TID) | INTRAVENOUS | Status: DC
  Administered 2018-09-20 (×2): 3.375 g via INTRAVENOUS
  Filled 2018-09-20 (×2): qty 50

## 2018-09-20 MED ORDER — FOLIC ACID 1 MG PO TABS
1.0000 mg | ORAL_TABLET | Freq: Every day | ORAL | Status: DC
  Administered 2018-09-20 – 2018-10-04 (×13): 1 mg via ORAL
  Filled 2018-09-20 (×14): qty 1

## 2018-09-20 MED ORDER — OXYCODONE HCL 5 MG PO TABS
5.0000 mg | ORAL_TABLET | ORAL | Status: DC | PRN
  Administered 2018-09-20 – 2018-09-21 (×4): 5 mg via ORAL
  Filled 2018-09-20 (×4): qty 1

## 2018-09-20 MED ORDER — OXYCODONE HCL 5 MG PO TABS: 5.0000 mg | ORAL_TABLET | ORAL | 0 refills | Status: DC | PRN

## 2018-09-20 NOTE — Progress Notes (Signed)
Pharmacy Antibiotic Note  Mark [name] is a 63 y.o. male admitted on 09/19/2018 with septic arthritis of lumbar spine. Pharmacy has been consulted for vancomycin and zosyn dosing. Patient afebrile, WBC 4.9 - wnl, Scr 1.85, and lactic acid of 2.1.   Plan: Vancomycin 1500 mg IV loading dose, followed by 750 mg IV q24h (expected AUC 508, Cmax ss 30.8, Cmin ss 14.3 using Scr of 1.82) Zosyn 3.375g IV q8h (4 hour infusion) Obtain vancomycin peak and trough once at steady state F/u BCx, Scr, CBC, clinical progression F/u plans for de-escalation and LOT  Height: 5\' 6"  (167.6 cm) Weight: 135 lb (61.2 kg) IBW/kg (Calculated) : 63.8  Temp (24hrs), Avg:97.8 F (36.6 C), Min:97.6 F (36.4 C), Max:98 F (36.7 C)  Recent Labs  Lab 09/20/18 0726 09/20/18 0727  WBC 4.9  --   CREATININE 1.82*  --   LATICACIDVEN  --  2.1*    Estimated Creatinine Clearance: 35 mL/min (A) (by C-G formula based on SCr of 1.82 mg/dL (H)).    No Known Allergies  Antimicrobials this admission: 5/25 Vancomycin  >>  5/25 Zosyn >>   Dose adjustments this admission: N/A  Microbiology results: 5/25 BCx:  5/25 COVID negative  Thank you for allowing pharmacy to be a part of this patient's care.  Leron Croak, PharmD PGY1 Pharmacy Resident Phone: [phone]  Please check AMION for all [place] phone numbers 09/20/2018 9:25 AM

## 2018-09-20 NOTE — ED Notes (Signed)
ED TO INPATIENT HANDOFF REPORT  ED Nurse Name and Phone #: Kathlee Nations [phone]  S Name/Age/Gender Mark [name] 63 y.o. male Room/Bed: 025C/025C  Code Status   Code Status: Full Code  Home/SNF/Other Home Patient oriented to: self, place, time and situation Is this baseline? Yes   Triage Complete: Triage complete  Chief Complaint back pain  Triage Note Pt in POV, reports back pain X several weeks. Denies injury. Has tried OTC meds with no relief. Ambulatory.    Allergies No Known Allergies  Level of Care/Admitting Diagnosis ED Disposition    ED Disposition Condition [facility] Area: [place] [[id number]]  Level of Care: Telemetry Medical [104]  Covid Evaluation: Screening Protocol (No Symptoms)  Diagnosis: Septic arthritis [facility]) [[id number]]  Admitting Physician: Norval Morton [[id number]]  Attending Physician: Norval Morton [[id number]]  Estimated length of stay: past midnight tomorrow  Certification:: I certify this patient will need inpatient services for at least 2 midnights  PT Class (Do Not Modify): Inpatient [101]  PT Acc Code (Do Not Modify): Private [1]       B Medical/Surgery History Past Medical History:  Diagnosis Date  . Alcohol abuse   . Anemia   . Arthritis   . Ascites   . Cirrhosis ([place])   . Coffee ground emesis   . Dehydration 06/17/2017  . Febrile illness   . Heart murmur   . Hyperlipidemia   . Hypertension   . Leg swelling   . Myocardial infarction ([place]) 2012  . Preop cardiovascular exam 04/14/2013  . Sepsis ([place]) 06/17/2017  . Septic shock ([place]) 06/18/2017  . SIRS (systemic inflammatory response syndrome) ([place]) 07/11/2017  . Stroke [facility]) 2012   no deficits  . Thrombocytopenia ([place])    Past Surgical History:  Procedure Laterality Date  . COLONOSCOPY WITH PROPOFOL N/A 02/07/2016   Procedure: COLONOSCOPY WITH PROPOFOL;  Surgeon: Milus Banister, MD;  Location: [facility] ENDOSCOPY;  Service: Endoscopy;   Laterality: N/A;  . ESOPHAGOGASTRODUODENOSCOPY (EGD) WITH PROPOFOL N/A 02/07/2016   Procedure: ESOPHAGOGASTRODUODENOSCOPY (EGD) WITH PROPOFOL;  Surgeon: Milus Banister, MD;  Location: [facility] ENDOSCOPY;  Service: Endoscopy;  Laterality: N/A;  . ESOPHAGOGASTRODUODENOSCOPY (EGD) WITH PROPOFOL N/A 06/22/2017   Procedure: ESOPHAGOGASTRODUODENOSCOPY (EGD) WITH PROPOFOL;  Surgeon: Jerene Bears, MD;  Location: [facility] ENDOSCOPY;  Service: Gastroenterology;  Laterality: N/A;  . HERNIA REPAIR Right    inguinal  . KNEE ARTHROSCOPY     bilateral/  12/14  . TEE WITHOUT CARDIOVERSION N/A 05/13/2018   Procedure: TRANSESOPHAGEAL ECHOCARDIOGRAM (TEE);  Surgeon: [name], MD;  Location: [facility] ENDOSCOPY;  Service: Cardiovascular;  Laterality: N/A;  . TOTAL KNEE ARTHROPLASTY Right 05/02/2013   Procedure: RIGHT TOTAL KNEE ARTHROPLASTY;  Surgeon: Mauri Pole, MD;  Location: [facility] ORS;  Service: Orthopedics;  Laterality: Right;     A IV Location/Drains/Wounds Patient Lines/Drains/Airways Status   Active Line/Drains/Airways    Name:   Placement date:   Placement time:   Site:   Days:   Peripheral IV 09/20/18 Right;Upper Arm   09/20/18    0451    Arm   less than 1          Intake/Output Last 24 hours  Intake/Output Summary (Last 24 hours) at 09/20/2018 0931 Last data filed at 09/20/2018 3016 Gross per 24 hour  Intake 200 ml  Output -  Net 200 ml    Labs/Imaging Results for orders placed or performed during the hospital encounter of 09/19/18 (from the  past 48 hour(s))  Type and screen     Status: None   Collection Time: 09/20/18  7:25 AM  Result Value Ref Range   ABO/RH(D) B POS    Antibody Screen NEG    Sample Expiration      09/23/2018,2359 Performed at [facility], [place] [address], [place], [place] [address]   Comprehensive metabolic panel     Status: Abnormal   Collection Time: 09/20/18  7:26 AM  Result Value Ref Range   Sodium 130 (L) 135 - 145 mmol/L   Potassium 4.2 3.5 - 5.1 mmol/L    Chloride 101 98 - 111 mmol/L   CO2 18 (L) 22 - 32 mmol/L   Glucose, Bld 56 (L) 70 - 99 mg/dL   BUN 26 (H) 8 - 23 mg/dL   Creatinine, Ser 1.82 (H) 0.61 - 1.24 mg/dL   Calcium 8.1 (L) 8.9 - 10.3 mg/dL   Total Protein 6.0 (L) 6.5 - 8.1 g/dL   Albumin 1.7 (L) 3.5 - 5.0 g/dL   AST 34 15 - 41 U/L   ALT 15 0 - 44 U/L   Alkaline Phosphatase 120 38 - 126 U/L   Total Bilirubin 1.8 (H) 0.3 - 1.2 mg/dL   GFR calc non Af Amer 38 (L) >60 mL/min   GFR calc Af Amer 44 (L) >60 mL/min   Anion gap 11 5 - 15    Comment: Performed at [facility], [place] [address] [place], [place] [address]  CBC with Differential     Status: Abnormal   Collection Time: 09/20/18  7:26 AM  Result Value Ref Range   WBC 4.9 4.0 - 10.5 K/uL   RBC 2.87 (L) 4.22 - 5.81 MIL/uL   Hemoglobin 9.1 (L) 13.0 - 17.0 g/dL   HCT 28.2 (L) 39.0 - 52.0 %   MCV 98.3 80.0 - 100.0 fL   MCH 31.7 26.0 - 34.0 pg   MCHC 32.3 30.0 - 36.0 g/dL   RDW 18.0 (H) 11.5 - 15.5 %   Platelets PLATELETS APPEAR DECREASED 150 - 400 K/uL    Comment: Immature Platelet Fraction may be clinically indicated, consider ordering this additional test [id number]    nRBC 0.0 0.0 - 0.2 %   Neutrophils Relative % 84 %   Neutro Abs 4.1 1.7 - 7.7 K/uL   Lymphocytes Relative 5 %   Lymphs Abs 0.3 (L) 0.7 - 4.0 K/uL   Monocytes Relative 5 %   Monocytes Absolute 0.3 0.1 - 1.0 K/uL   Eosinophils Relative 6 %   Eosinophils Absolute 0.3 0.0 - 0.5 K/uL   Basophils Relative 0 %   Basophils Absolute 0.0 0.0 - 0.1 K/uL   WBC Morphology MILD LEFT SHIFT (1-5% METAS, OCC MYELO, OCC BANDS)    Immature Granulocytes 0 %   Abs Immature Granulocytes 0.02 0.00 - 0.07 K/uL    Comment: Performed at [facility], [place] [address] [place], [place] [address]  C-reactive protein     Status: Abnormal   Collection Time: 09/20/18  7:26 AM  Result Value Ref Range   CRP 18.9 (H) <1.0 mg/dL    Comment: Performed at [place] [address] [place], [place] [address]   Sedimentation rate     Status: Abnormal   Collection Time: 09/20/18  7:26 AM  Result Value Ref Range   Sed Rate 55 (H) 0 - 16 mm/hr    Comment: Performed at [place]  [address], [place], [place] [address]  Lactic acid, plasma     Status: Abnormal   Collection Time: 09/20/18  7:27 AM  Result Value Ref Range   Lactic Acid, Venous 2.1 (HH) 0.5 - 1.9 mmol/L    Comment: CRITICAL RESULT CALLED TO, READ BACK BY AND VERIFIED WITH: J.BLUE,EN 09/20/2018 0816 DAVISB Performed at [name] [address] [place], [place] [address]   SARS Coronavirus 2 (CEPHEID - Performed in [place] hospital lab), Hosp Order     Status: None   Collection Time: 09/20/18  7:34 AM  Result Value Ref Range   SARS Coronavirus 2 NEGATIVE NEGATIVE    Comment: (NOTE) If result is NEGATIVE SARS-CoV-2 target nucleic acids are NOT DETECTED. The SARS-CoV-2 RNA is generally detectable in upper and lower  respiratory specimens during the acute phase of infection. The lowest  concentration of SARS-CoV-2 viral copies this assay can detect is 250  copies / mL. A negative result does not preclude SARS-CoV-2 infection  and should not be used as the sole basis for treatment or other  patient management decisions.  A negative result may occur with  improper specimen collection / handling, submission of specimen other  than nasopharyngeal swab, presence of viral mutation(s) within the  areas targeted by this assay, and inadequate number of viral copies  (<250 copies / mL). A negative result must be combined with clinical  observations, patient history, and epidemiological information. If result is POSITIVE SARS-CoV-2 target nucleic acids are DETECTED. The SARS-CoV-2 RNA is generally detectable in upper and lower  respiratory specimens dur ing the acute phase of infection.  Positive  results are indicative of active infection with SARS-CoV-2.  Clinical  correlation with patient history and other  diagnostic information is  necessary to determine patient infection status.  Positive results do  not rule out bacterial infection or co-infection with other viruses. If result is PRESUMPTIVE POSTIVE SARS-CoV-2 nucleic acids MAY BE PRESENT.   A presumptive positive result was obtained on the submitted specimen  and confirmed on repeat testing.  While 2019 novel coronavirus  (SARS-CoV-2) nucleic acids may be present in the submitted sample  additional confirmatory testing may be necessary for epidemiological  and / or clinical management purposes  to differentiate between  SARS-CoV-2 and other Sarbecovirus currently known to infect humans.  If clinically indicated additional testing with an alternate test  methodology [phone]) is advised. The SARS-CoV-2 RNA is generally  detectable in upper and lower respiratory sp ecimens during the acute  phase of infection. The expected result is Negative. Fact Sheet for Patients:  StrictlyIdeas.no Fact Sheet for Healthcare Providers: BankingDealers.co.za This test is not yet approved or cleared by the [place] FDA and has been authorized for detection and/or diagnosis of SARS-CoV-2 by FDA under an Emergency Use Authorization (EUA).  This EUA will remain in effect (meaning this test can be used) for the duration of the COVID-19 declaration under Section 564(b)(1) of the Act, 21 U.S.C. section 360bbb-3(b)(1), unless the authorization is terminated or revoked sooner. Performed at [facility], [place] [address], [place], [place] [address]   POC CBG, ED     Status: Abnormal   Collection Time: 09/20/18  9:27 AM  Result Value Ref Range   Glucose-Capillary 65 (L) 70 - 99 mg/dL   Mr Lumbar Spine W Wo Contrast  Result Date: 09/20/2018 CLINICAL DATA:  Initial evaluation for persistent low back pain for several weeks. EXAM: MRI LUMBAR SPINE WITHOUT AND WITH  CONTRAST TECHNIQUE: Multiplanar and  multiecho pulse sequences of the lumbar spine were obtained without and with intravenous contrast. CONTRAST:  60 cc of Gadavist. COMPARISON:  None available. FINDINGS: Segmentation: Standard. Lowest well-formed disc labeled the L5-S1 level. Alignment: Vertebral bodies normally aligned with preservation of the normal lumbar lordosis. No listhesis or subluxation. Vertebrae: Vertebral body heights maintained without evidence for acute or chronic fracture. Bone marrow signal intensity diffusely heterogeneous without discrete or worrisome osseous lesions. Reactive endplate changes present about the L3-4, L4-5, and L5-S1 interspaces. Fluid density present within the right aspect of the L4-5 interspace without significant endplate irregularity or enhancement, favored to be degenerative. Mild edema and enhancement about the L5-S1 interspace also favored to be degenerative. There is abnormal edema and enhancement about the L5-S1 facets, most notable on the right. Associated small bilateral joint effusions. While these findings could be degenerative in nature, possible septic arthritis should be considered. Possible involvement at L4-5 on the right as well. Conus medullaris and cauda equina: Conus extends to the L2 level. Conus medullaris within normal limits. There is abnormal epidural enhancement involving the lower lumbar spinal canal, primarily extending from L3 through the sacrum. Associated epidural collection within the ventral epidural space most likely reflects abscess and/or phlegmon. This measures approximately 0.6 x 1.9 x 4.4 cm in greatest dimensions (AP by transverse by craniocaudad), most prominent at the L4 and L5 levels (series 14, image 8). Additional component seen posteriorly within the dorsal epidural space, most notable at L5-S1 on the right (series 15, image 23). Probable associated subdural and/or intradural effusion with mild displacement of the distal nerve roots of the cauda equina at the level of  L2-3 (series 8, image 12). Paraspinal and other soft tissues: Mild soft tissue edema and enhancement adjacent to the right L4-5 and L5-S1 facets. Similarly, edema and enhancement present within the right psoas muscle extending from L3-4 through L5-S1, consistent with infection. No discrete soft tissue abscess or drainable fluid collection. Disc levels: L1-2: Negative interspace. Mild bilateral facet hypertrophy. No canal or foraminal stenosis. L2-3: Negative interspace. Mild bilateral facet and ligament flavum hypertrophy. Mild concentric epidural phlegmon/abscess with probable subdural effusion. Nerve roots of the cauda equina mildly compressed (series 8, image 12). Thecal sac remains patent. No foraminal stenosis. L3-4: Chronic intervertebral disc space narrowing with diffuse disc bulge and disc desiccation. Mild reactive endplate changes. Moderate facet and ligament flavum hypertrophy. Concentric epidural phlegmon/abscess. Resultant moderate spinal stenosis. Mild bilateral L3 foraminal narrowing. L4-5: Chronic intervertebral disc space narrowing with diffuse disc bulge and disc desiccation. Mild reactive endplate changes. Moderate facet and ligament flavum hypertrophy. Associated bilateral joint effusions. Epidural phlegmon/abscess, most notable within the ventral epidural space at the level of the left lateral recesses at the level of L4 and L5 (series 8, images 21, 25). Resultant severe canal with left worse than right lateral recess stenosis. Moderate bilateral L4 foraminal narrowing, right worse than left. L5-S1: Chronic intervertebral disc space narrowing with disc desiccation and diffuse disc bulge. Chronic reactive endplate changes with marginal endplate osteophytic spurring. Moderate to advanced right worse than left facet degeneration. Bilateral joint effusions, with concern for possible septic arthritis, right greater than left. Epidural phlegmon/abscess, most notable at the left lateral recess and  right dorsal epidural space. Moderate to severe canal and bilateral foraminal stenosis. IMPRESSION: 1. Abnormal enhancement throughout the epidural space extending from approximately L3 through the sacrum, concerning for infection. Superimposed epidural phlegmon/abscess as above, most notable within the ventral epidural space at the levels  of L4 and L5. 2. Abnormal marrow edema and enhancement about the right greater than left L5-S1 facets, suggesting septic arthritis. Finding could be source of infection. 3. Associated moderate to severe spinal stenosis at L3-4 through L5-S1, with moderate to advanced bilateral L4 and L5 foraminal stenosis as above. 4. Associated lower right paraspinous soft tissue edema and enhancement without discrete abscess or drainable fluid collection. Electronically Signed   By: Jeannine Boga M.D.   On: 09/20/2018 06:58    Pending Labs Unresulted Labs (From admission, onward)    Start     Ordered   09/21/18 0500  CBC  Tomorrow morning,   R     09/20/18 0923   09/21/18 0500  Comprehensive metabolic panel  Tomorrow morning,   R     09/20/18 0923   09/20/18 0920  Lactic acid, plasma  STAT Now then every 3 hours,   STAT     09/20/18 0924   09/20/18 0913  Ammonia  Add-on,   R     09/20/18 0912   09/20/18 0750  Rapid urine drug screen (hospital performed)  ONCE - STAT,   R     09/20/18 0749   09/20/18 0706  Urinalysis, Routine w reflex microscopic  ONCE - STAT,   STAT     09/20/18 0706   09/20/18 0705  Lactic acid, plasma  Now then every 2 hours,   STAT     09/20/18 0706   09/20/18 0705  Culture, blood (routine x 2)  BLOOD CULTURE X 2,   STAT     09/20/18 0706          Vitals/Pain Today's Vitals   09/20/18 0641 09/20/18 0718 09/20/18 0755 09/20/18 0800  BP: 129/68 (!) 109/59  110/63  Pulse: 99 (!) 102  (!) 103  Resp: 16 14    Temp:      TempSrc:      SpO2: 100% 98%  99%  Weight:      Height:      PainSc:  Asleep 0-No pain     Isolation  Precautions No active isolations  Medications Medications  sodium chloride 0.9 % bolus 1,000 mL (has no administration in time range)  nadolol (CORGARD) tablet 20 mg (has no administration in time range)  folic acid (FOLVITE) tablet 1 mg (has no administration in time range)  thiamine tablet 100 mg (has no administration in time range)  sodium chloride flush (NS) 0.9 % injection 3 mL (has no administration in time range)  dextrose 5 %-0.45 % sodium chloride infusion (has no administration in time range)  acetaminophen (TYLENOL) tablet 650 mg (has no administration in time range)    Or  acetaminophen (TYLENOL) suppository 650 mg (has no administration in time range)  ondansetron (ZOFRAN) tablet 4 mg (has no administration in time range)    Or  ondansetron (ZOFRAN) injection 4 mg (has no administration in time range)  albuterol (PROVENTIL) (2.5 MG/3ML) 0.083% nebulizer solution 2.5 mg (has no administration in time range)  LORazepam (ATIVAN) tablet 1 mg (has no administration in time range)    Or  LORazepam (ATIVAN) injection 1 mg (has no administration in time range)  multivitamin with minerals tablet 1 tablet (has no administration in time range)  LORazepam (ATIVAN) injection 0-4 mg (has no administration in time range)    Followed by  LORazepam (ATIVAN) injection 0-4 mg (has no administration in time range)  oxyCODONE (Oxy IR/ROXICODONE) immediate release tablet 5 mg (has no administration in  time range)  piperacillin-tazobactam (ZOSYN) IVPB 3.375 g (has no administration in time range)  vancomycin (VANCOCIN) 1,250 mg in sodium chloride 0.9 % 250 mL IVPB (has no administration in time range)  piperacillin-tazobactam (ZOSYN) IVPB 3.375 g (has no administration in time range)  cyclobenzaprine (FLEXERIL) tablet 10 mg (10 mg Oral Given 09/19/18 2346)  oxyCODONE (Oxy IR/ROXICODONE) immediate release tablet 5 mg (5 mg Oral Given 09/19/18 2344)  gadobutrol (GADAVIST) 1 MMOL/ML injection 6 mL  (6 mLs Intravenous Contrast Given 09/20/18 0613)  oxyCODONE (Oxy IR/ROXICODONE) immediate release tablet 5 mg (5 mg Oral Given 09/20/18 0645)  vancomycin (VANCOCIN) IVPB 1000 mg/200 mL premix (0 mg Intravenous Stopped 09/20/18 0906)  piperacillin-tazobactam (ZOSYN) IVPB 3.375 g (3.375 g Intravenous New Bag/Given 09/20/18 0751)  dextrose 50 % solution 25 g (25 g Intravenous Given 09/20/18 0858)    Mobility walks with device Moderate fall risk   Focused Assessments Pulmonary Assessment Handoff:  Lung sounds:   O2 Device: Room Air        R Recommendations: See Admitting Provider Note  Report given to:   Additional Notes:

## 2018-09-20 NOTE — ED Notes (Signed)
Pt went to MRI.

## 2018-09-20 NOTE — Plan of Care (Signed)
  Problem: Pain Managment: Goal: General experience of comfort will improve Outcome: Progressing Note:  Pt has complained of back pain and received a PRN when he arrived to unit. Will continue to monitor this during my shift.    Problem: Nutrition: Goal: Adequate nutrition will be maintained Outcome: Not Progressing Note:  Pt has not been able to eat during my shift. Pt has been sleepy but has also been ordered to be NPO.    Problem: Metabolic: Goal: Ability to maintain appropriate glucose levels will improve Outcome: Not Progressing Note:  Pt arrived to unit with a glucose level of 52. Pt was treated and glucose recovered. Pt was encouraged to eat lunch with little effect during my shift.

## 2018-09-20 NOTE — Progress Notes (Signed)
Received report from Benjamine Mola, [place] in ED

## 2018-09-20 NOTE — H&P (Signed)
History and Physical    Calvert Charland PHX:[id number] DOB: May 14, 1955 DOA: 09/19/2018  Referring MD/NP/PA: Lajean Saver, MD PCP: [name], MD  Patient coming from: Home  Chief Complaint: Back pain  I have personally briefly reviewed patient's old medical records in [facility]   HPI: Mark [name] is a 63 y.o. male with medical history significant of alcoholic liver cirrhosis, pancytopenia, HTN, CAD, polysubstance abuse(alcohol, heroin, marijuana), CKD stage III, and MSSA bacteremia in 04/2018; who presents with complaints of worsening lower back pain.  Pain is described as severe. Reports that he has had this pain intermittently for years, but has been more constant.  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  Denies having any significant fever, change in bowel habits, nausea, vomiting, diarrhea, shortness of breath, or chest pain.  He tried over-the-counter NSAIDs without relief.  Patient still reports drinking beer on a daily basis and last reports snorting heroin prior to coming into the emergency department.  In January patient was admitted for MSSA bacteremia and plan was to treat with 3 weeks of IV Ancef to be stopped originally on 1/27.  However, due to home health concerns of going to the patient's house IV antibiotics were discontinued on 1/24 with removal of the PICC line after discussions with infectious disease.  Patient had just recently been seen in the emergency department for left lower extremity cellulitis on 4/26 treated with oral antibiotics of Bactrim and topical ketoconazole.  Denies having any issues with large left lower extremity at this time.  Lastly, he admits to eating normal, but states that he has been more sleepy where sometimes will not get up to eat.  ED Course: Upon admission to the emergency department patient was noted to be afebrile, pulse 76-105, and all other vital signs within normal  limits.  Labs revealed WBC 4.9, hemoglobin 9.1, platelet count documented as low, sodium 138, CO2 18, BUN 26, creatinine 1.82, glucose 56, albumin 1.7, total bilirubin 1.8, CRP 18.9, and lactic acid 2.1.  COVID-19 screening was negative.  MRI of the lumbar spine revealed enhancement throughout the epidural space of L3 through the sacrum concerning for possible phlegmon/abscess, L5-S1 marrow enhancement concerning for septic arthritis, and spinal stenosis L3-S1.    Blood cultures were obtained.  For his low blood glucose patient had been given dextrose 25 g x 1 dose.  Patient was given 1 L of normal saline IV fluids, oxycodone for pain, Flexeril, vancomycin and Zosyn.  Dr. Zada Finders of neurosurgery consulted, and will see later this a.m. [facility] called to admit.  Review of Systems  Constitutional: Negative for chills and fever.  HENT: Negative for ear pain and nosebleeds.   Eyes: Negative for double vision and photophobia.  Respiratory: Negative for cough and shortness of breath.   Cardiovascular: Negative for chest pain and orthopnea.  Gastrointestinal: Negative for abdominal pain, diarrhea, nausea and vomiting.  Genitourinary: Negative for dysuria, frequency and hematuria.  Musculoskeletal: Positive for back pain and myalgias. Negative for falls.  Skin: Negative for rash.  Neurological: Negative for focal weakness and loss of consciousness.  Psychiatric/Behavioral: Positive for substance abuse. Negative for memory loss.    Past Medical History:  Diagnosis Date   Alcohol abuse    Anemia    Arthritis    Ascites    Cirrhosis ([name])    Coffee ground emesis    Dehydration 06/17/2017   Febrile illness    Heart murmur  Hyperlipidemia    Hypertension    Leg swelling    Myocardial infarction ([place]) 2012   Preop cardiovascular exam 04/14/2013   Sepsis ([place]) 06/17/2017   Septic shock ([place]) 06/18/2017   SIRS (systemic inflammatory response syndrome) ([place]) 07/11/2017   Stroke ([place])  2012   no deficits   Thrombocytopenia ([place])     Past Surgical History:  Procedure Laterality Date   COLONOSCOPY WITH PROPOFOL N/A 02/07/2016   Procedure: COLONOSCOPY WITH PROPOFOL;  Surgeon: Milus Banister, MD;  Location: [facility];  Service: Endoscopy;  Laterality: N/A;   ESOPHAGOGASTRODUODENOSCOPY (EGD) WITH PROPOFOL N/A 02/07/2016   Procedure: ESOPHAGOGASTRODUODENOSCOPY (EGD) WITH PROPOFOL;  Surgeon: Milus Banister, MD;  Location: [facility];  Service: Endoscopy;  Laterality: N/A;   ESOPHAGOGASTRODUODENOSCOPY (EGD) WITH PROPOFOL N/A 06/22/2017   Procedure: ESOPHAGOGASTRODUODENOSCOPY (EGD) WITH PROPOFOL;  Surgeon: Jerene Bears, MD;  Location: [facility] ENDOSCOPY;  Service: Gastroenterology;  Laterality: N/A;   HERNIA REPAIR Right    inguinal   KNEE ARTHROSCOPY     bilateral/  12/14   TEE WITHOUT CARDIOVERSION N/A 05/13/2018   Procedure: TRANSESOPHAGEAL ECHOCARDIOGRAM (TEE);  Surgeon: [name], MD;  Location: [facility];  Service: Cardiovascular;  Laterality: N/A;   TOTAL KNEE ARTHROPLASTY Right 05/02/2013   Procedure: RIGHT TOTAL KNEE ARTHROPLASTY;  Surgeon: Mauri Pole, MD;  Location: [facility];  Service: Orthopedics;  Laterality: Right;     reports that he quit smoking about 2 years ago. His smoking use included cigarettes. He has a 5.00 pack-year smoking history. He has never used smokeless tobacco. He reports current alcohol use. He reports current drug use. Drugs: Marijuana and Heroin.  No Known Allergies  Family History  Problem Relation Age of Onset   Hypertension Father    Diabetes Father    Dementia Mother    Lupus Sister     Prior to Admission medications   Medication Sig Start Date End Date Taking? Authorizing Provider  albuterol (PROVENTIL HFA;VENTOLIN HFA) 108 (90 Base) MCG/ACT inhaler Inhale 2 puffs into the lungs every 6 (six) hours as needed for wheezing or shortness of breath. Patient not taking: Reported on 06/03/2018 09/16/17   [name], MD  diphenhydrAMINE-zinc acetate (BENADRYL EXTRA STRENGTH) cream Apply 1 application topically 3 (three) times daily as needed for itching. Patient not taking: Reported on 06/03/2018 04/25/18   Domenic Moras, PA-C  folic acid (FOLVITE) 1 MG tablet TAKE 1 TABLET BY MOUTH EVERY DAY 07/23/18   [name], MD  furosemide (LASIX) 20 MG tablet TAKE 1 TABLET BY MOUTH TWICE DAILY 07/23/18   [name], MD  guaiFENesin (MUCINEX) 600 MG 12 hr tablet Take 1 tablet (600 mg total) by mouth 2 (two) times daily as needed for cough or to loosen phlegm. Patient not taking: Reported on 06/03/2018 07/14/17   Rosita Fire, MD  ketoconazole (NIZORAL) 2 % shampoo Apply 1 application topically 2 (two) times a week. 08/23/18   Kinnie Feil, PA-C  lactulose (CHRONULAC) 10 GM/15ML solution TAKE 45 MILLILITERS BY MOUTH TWICE DAILY 08/26/18   [name], MD  nadolol (CORGARD) 20 MG tablet TAKE 1 TABLET BY MOUTH EVERY DAY 08/26/18   [name], MD  pantoprazole (PROTONIX) 40 MG tablet TAKE 1 TABLET(40 MG) BY MOUTH DAILY Patient taking differently: Take 40 mg by mouth daily.  03/03/18   [name], MD  silver sulfADIAZINE (SILVADENE) 1 % cream Apply 1 application topically daily as needed. To  Burn Patient not  taking: Reported on 06/03/2018 05/14/18   Aline August, MD  spironolactone (ALDACTONE) 50 MG tablet Take 1 tablet (50 mg total) by mouth 2 (two) times daily. 05/21/18   Oswald Hillock, MD  thiamine 100 MG tablet Take 1 tablet (100 mg total) by mouth daily. Patient not taking: Reported on 06/03/2018 12/04/17   [name], MD  triamcinolone (KENALOG) 0.025 % ointment Apply 1 application topically 2 (two) times daily as needed (rash). 05/14/18   Aline August, MD    Physical Exam:  Constitutional: Older male who appears chronically ill Vitals:   09/20/18 0145 09/20/18 0345 09/20/18 0641 09/20/18 0718  BP: 119/67 (!) 137/99 129/68 (!) 109/59  Pulse: 85 (!) 105 99 (!) 102    Resp: '13 16 16 14  ' Temp:      TempSrc:      SpO2: 98% 100% 100% 98%  Weight:      Height:       Eyes: PERRL, lids and conjunctivae normal ENMT: Mucous membranes are moist. Posterior pharynx clear of any exudate or lesions. Edentulous. Neck: normal, supple, no masses, no thyromegaly Respiratory: clear to auscultation bilaterally, no wheezing, no crackles. Normal respiratory effort. No accessory muscle use.  Cardiovascular: Tachycardic, no murmurs / rubs / gallops. No extremity edema. 2+ pedal pulses. No carotid bruits.  Abdomen: no tenderness, no masses palpated. No hepatosplenomegaly. Bowel sounds positive.  Musculoskeletal: no clubbing / cyanosis. No joint deformity upper and lower extremities. Good ROM, no contractures. Normal muscle tone.  Skin: no rashes, lesions, ulcers. No induration Neurologic: CN 2-12 grossly intact. Sensation intact, DTR normal. Strength 5/5 in all 4.  Psychiatric: Normal judgment and insight. Alert and oriented x 3. Normal mood.     Labs on Admission: I have personally reviewed following labs and imaging studies  CBC: Recent Labs  Lab 09/20/18 0726  WBC 4.9  NEUTROABS 4.1  HGB 9.1*  HCT 28.2*  MCV 98.3  PLT PLATELETS APPEAR DECREASED   Basic Metabolic Panel: Recent Labs  Lab 09/20/18 0726  NA 130*  K 4.2  CL 101  CO2 18*  GLUCOSE 56*  BUN 26*  CREATININE 1.82*  CALCIUM 8.1*   GFR: Estimated Creatinine Clearance: 35 mL/min (A) (by C-G formula based on SCr of 1.82 mg/dL (H)). Liver Function Tests: Recent Labs  Lab 09/20/18 0726  AST 34  ALT 15  ALKPHOS 120  BILITOT 1.8*  PROT 6.0*  ALBUMIN 1.7*   No results for input(s): LIPASE, AMYLASE in the last 168 hours. No results for input(s): AMMONIA in the last 168 hours. Coagulation Profile: No results for input(s): INR, PROTIME in the last 168 hours. Cardiac Enzymes: No results for input(s): CKTOTAL, CKMB, CKMBINDEX, TROPONINI in the last 168 hours. BNP (last 3 results) No results  for input(s): PROBNP in the last 8760 hours. HbA1C: No results for input(s): HGBA1C in the last 72 hours. CBG: No results for input(s): GLUCAP in the last 168 hours. Lipid Profile: No results for input(s): CHOL, HDL, LDLCALC, TRIG, CHOLHDL, LDLDIRECT in the last 72 hours. Thyroid Function Tests: No results for input(s): TSH, T4TOTAL, FREET4, T3FREE, THYROIDAB in the last 72 hours. Anemia Panel: No results for input(s): VITAMINB12, FOLATE, FERRITIN, TIBC, IRON, RETICCTPCT in the last 72 hours. Urine analysis:    Component Value Date/Time   COLORURINE YELLOW 05/18/2018 1230   APPEARANCEUR CLEAR 05/18/2018 1230   LABSPEC 1.012 05/18/2018 1230   PHURINE 8.0 05/18/2018 1230   GLUCOSEU NEGATIVE 05/18/2018 1230   [place] 05/18/2018  [place] 05/18/2018 [place] 05/18/2018 1230   PROTEINUR NEGATIVE 05/18/2018 1230   UROBILINOGEN 2.0 (H) 04/26/2013 0945   NITRITE NEGATIVE 05/18/2018 1230   LEUKOCYTESUR NEGATIVE 05/18/2018 1230   Sepsis Labs: Recent Results (from the past 240 hour(s))  SARS Coronavirus 2 (CEPHEID - Performed in [place] hospital lab), Hosp Order     Status: None   Collection Time: 09/20/18  7:34 AM  Result Value Ref Range Status   SARS Coronavirus 2 NEGATIVE NEGATIVE Final    Comment: (NOTE) If result is NEGATIVE SARS-CoV-2 target nucleic acids are NOT DETECTED. The SARS-CoV-2 RNA is generally detectable in upper and lower  respiratory specimens during the acute phase of infection. The lowest  concentration of SARS-CoV-2 viral copies this assay can detect is 250  copies / mL. A negative result does not preclude SARS-CoV-2 infection  and should not be used as the sole basis for treatment or other  patient management decisions.  A negative result may occur with  improper specimen collection / handling, submission of specimen other  than nasopharyngeal swab, presence of viral mutation(s) within the  areas targeted by this  assay, and inadequate number of viral copies  (<250 copies / mL). A negative result must be combined with clinical  observations, patient history, and epidemiological information. If result is POSITIVE SARS-CoV-2 target nucleic acids are DETECTED. The SARS-CoV-2 RNA is generally detectable in upper and lower  respiratory specimens dur ing the acute phase of infection.  Positive  results are indicative of active infection with SARS-CoV-2.  Clinical  correlation with patient history and other diagnostic information is  necessary to determine patient infection status.  Positive results do  not rule out bacterial infection or co-infection with other viruses. If result is PRESUMPTIVE POSTIVE SARS-CoV-2 nucleic acids MAY BE PRESENT.   A presumptive positive result was obtained on the submitted specimen  and confirmed on repeat testing.  While 2019 novel coronavirus  (SARS-CoV-2) nucleic acids may be present in the submitted sample  additional confirmatory testing may be necessary for epidemiological  and / or clinical management purposes  to differentiate between  SARS-CoV-2 and other Sarbecovirus currently known to infect humans.  If clinically indicated additional testing with an alternate test  methodology [phone]) is advised. The SARS-CoV-2 RNA is generally  detectable in upper and lower respiratory sp ecimens during the acute  phase of infection. The expected result is Negative. Fact Sheet for Patients:  StrictlyIdeas.no Fact Sheet for Healthcare Providers: BankingDealers.co.za This test is not yet approved or cleared by the [place] FDA and has been authorized for detection and/or diagnosis of SARS-CoV-2 by FDA under an Emergency Use Authorization (EUA).  This EUA will remain in effect (meaning this test can be used) for the duration of the COVID-19 declaration under Section 564(b)(1) of the Act, 21 U.S.C. section 360bbb-3(b)(1),  unless the authorization is terminated or revoked sooner. Performed at [facility], [place] [address] [place], [place] [address]      Radiological Exams on Admission: Mr Lumbar Spine W Wo Contrast  Result Date: 09/20/2018 CLINICAL DATA:  Initial evaluation for persistent low back pain for several weeks. EXAM: MRI LUMBAR SPINE WITHOUT AND WITH CONTRAST TECHNIQUE: Multiplanar and multiecho pulse sequences of the lumbar spine were obtained without and with intravenous contrast. CONTRAST:  60 cc of Gadavist. COMPARISON:  None available. FINDINGS: Segmentation: Standard. Lowest well-formed disc labeled the L5-S1 level. Alignment: Vertebral bodies normally aligned with preservation  of the normal lumbar lordosis. No listhesis or subluxation. Vertebrae: Vertebral body heights maintained without evidence for acute or chronic fracture. Bone marrow signal intensity diffusely heterogeneous without discrete or worrisome osseous lesions. Reactive endplate changes present about the L3-4, L4-5, and L5-S1 interspaces. Fluid density present within the right aspect of the L4-5 interspace without significant endplate irregularity or enhancement, favored to be degenerative. Mild edema and enhancement about the L5-S1 interspace also favored to be degenerative. There is abnormal edema and enhancement about the L5-S1 facets, most notable on the right. Associated small bilateral joint effusions. While these findings could be degenerative in nature, possible septic arthritis should be considered. Possible involvement at L4-5 on the right as well. Conus medullaris and cauda equina: Conus extends to the L2 level. Conus medullaris within normal limits. There is abnormal epidural enhancement involving the lower lumbar spinal canal, primarily extending from L3 through the sacrum. Associated epidural collection within the ventral epidural space most likely reflects abscess and/or phlegmon. This measures approximately 0.6 x 1.9 x 4.4  cm in greatest dimensions (AP by transverse by craniocaudad), most prominent at the L4 and L5 levels (series 14, image 8). Additional component seen posteriorly within the dorsal epidural space, most notable at L5-S1 on the right (series 15, image 23). Probable associated subdural and/or intradural effusion with mild displacement of the distal nerve roots of the cauda equina at the level of L2-3 (series 8, image 12). Paraspinal and other soft tissues: Mild soft tissue edema and enhancement adjacent to the right L4-5 and L5-S1 facets. Similarly, edema and enhancement present within the right psoas muscle extending from L3-4 through L5-S1, consistent with infection. No discrete soft tissue abscess or drainable fluid collection. Disc levels: L1-2: Negative interspace. Mild bilateral facet hypertrophy. No canal or foraminal stenosis. L2-3: Negative interspace. Mild bilateral facet and ligament flavum hypertrophy. Mild concentric epidural phlegmon/abscess with probable subdural effusion. Nerve roots of the cauda equina mildly compressed (series 8, image 12). Thecal sac remains patent. No foraminal stenosis. L3-4: Chronic intervertebral disc space narrowing with diffuse disc bulge and disc desiccation. Mild reactive endplate changes. Moderate facet and ligament flavum hypertrophy. Concentric epidural phlegmon/abscess. Resultant moderate spinal stenosis. Mild bilateral L3 foraminal narrowing. L4-5: Chronic intervertebral disc space narrowing with diffuse disc bulge and disc desiccation. Mild reactive endplate changes. Moderate facet and ligament flavum hypertrophy. Associated bilateral joint effusions. Epidural phlegmon/abscess, most notable within the ventral epidural space at the level of the left lateral recesses at the level of L4 and L5 (series 8, images 21, 25). Resultant severe canal with left worse than right lateral recess stenosis. Moderate bilateral L4 foraminal narrowing, right worse than left. L5-S1: Chronic  intervertebral disc space narrowing with disc desiccation and diffuse disc bulge. Chronic reactive endplate changes with marginal endplate osteophytic spurring. Moderate to advanced right worse than left facet degeneration. Bilateral joint effusions, with concern for possible septic arthritis, right greater than left. Epidural phlegmon/abscess, most notable at the left lateral recess and right dorsal epidural space. Moderate to severe canal and bilateral foraminal stenosis. IMPRESSION: 1. Abnormal enhancement throughout the epidural space extending from approximately L3 through the sacrum, concerning for infection. Superimposed epidural phlegmon/abscess as above, most notable within the ventral epidural space at the levels of L4 and L5. 2. Abnormal marrow edema and enhancement about the right greater than left L5-S1 facets, suggesting septic arthritis. Finding could be source of infection. 3. Associated moderate to severe spinal stenosis at L3-4 through L5-S1, with moderate to advanced bilateral L4 and L5  foraminal stenosis as above. 4. Associated lower right paraspinous soft tissue edema and enhancement without discrete abscess or drainable fluid collection. Electronically Signed   By: Jeannine Boga M.D.   On: 09/20/2018 06:58    EKG: Independently reviewed.  Sinus rhythm at 99 bpm  Assessment/Plan Septic arthritis/epidural abscess of lumbar spine: Acute.  Patient presents with complaints of lower back pain.  MRI imaging concerning for possible epidural abscess with septic arthritis.  Labs also concerning for infection as lactic acid 2.1, CRP 18, and ESR 55.  Patient previously had MSSA bacteremia that was pansensitive in January treated with Ancef.  Blood cultures were obtained and patient had been started on vancomycin and Zosyn. -Admit to a telemetry bed -Follow-up blood cultures -Continue empiric antibiotics of vancomycin and Zosyn -Oxycodone as needed for pain  Acute kidney injury: Baseline  creatinine previously noted to be 0.95 on 08/22/2018.  On admission creatinine 1.82 with BUN 26.  Patient on diuretics of furosemide and spironolactone.  Patient was given 1 L normal saline IV fluids. -Check urinalysis -D5 -0.45%NS at 100 mL/h -Hold nephrotoxic agent -Recheck BMP in a.m.  Lactic acidosis: On admission lactic acid elevated 2.1.  Suspect combination of infection and/or dehydration. -Trend lactic acid level     Hypoglycemia: Acute.  Patient's initial blood glucose noted to be 56.  He is not on any oral hypoglycemic agents or insulin. -Hypoglycemic protocols -Continue to monitor CBGs every 4 hours x3 -IV fluids as seen above -Consider changing IV fluids if patient develops hyperglycemia  Hepatic encephalopathy with alcoholic cirrhosis: Acute on chronic.  Patient appears to be compensated at this time, but was noted to be lethargic on physical exam.  Patient with previous history of elevated ammonia levels in the past.  Lactulose does not appear to be on his daily home medications. -Aspiration precautions -Check ammonia level(111) -Start lactulose 30 g twice daily and recheck ammonia level in a.m. -Continue nadolol -Hold furosemide and spironolactone -Restart medications when medically appropriate  Polysubstance abuse: Patient admits to alcohol and recent heroin use prior to arrival. -Follow-up UDS -CIWA protocol -Counseled on need of cessation of alcohol and illicit drug use  Anemia of chronic disease: Hemoglobin 9.1 g/dL which appears improved from previous ED visit last month on 4/26 where hemoglobin was noted to be 6.9 g/dL. -Recheck CBC in a.m.  Hyponatremia: Acute.  Initial sodium 130 on admission.  Suspect hypovolemic hyponatremia given acute kidney injury and diuretics. -IV fluids as seen above  Hypoalbuminemia: Albumin 1.7 on admission.  -Check prealbumin in a.m.   DVT prophylaxis: SCDs Code Status: Full Family Communication: No family present at  bedside Disposition Plan: To be determined Consults called: Neurosurgery Admission status: Inpatient  Norval Morton MD [facility] Pager [phone]   If 7PM-7AM, please contact night-coverage [email or web address] Password [facility]  09/20/2018, 8:55 AM

## 2018-09-20 NOTE — Discharge Instructions (Signed)
Apply ice as needed.  Do not take acetaminophen or ibuprofen or naproxen.

## 2018-09-20 NOTE — ED Notes (Signed)
Attempted iv unsuccessful

## 2018-09-20 NOTE — Consult Note (Signed)
Neurosurgery Consultation  Reason for Consult: Lumbar spinal epidural abscess Referring Physician: Tamala Julian  CC: Back pain  HPI: This is a 63 y.o. man that presents with acute on chronic low back pain. He denies any new weakness, numbness, or parasthesias, no recent change in bowel or bladder function, states he has been able to void / defecate without difficulty, with normal control and sensation. Of note, he had a recent admission for MSSA bacteremia, polysubstance abuse with alcoholic cirrhosis, stage 3 CKD, and pancytopenia.    ROS: A 14 point ROS was performed and is negative except as noted in the HPI.   PMHx:  Past Medical History:  Diagnosis Date  . Alcohol abuse   . Anemia   . Arthritis   . Ascites   . Cirrhosis ([place])   . Coffee ground emesis   . Dehydration 06/17/2017  . Febrile illness   . Heart murmur   . Hyperlipidemia   . Hypertension   . Leg swelling   . Myocardial infarction ([place]) 2012  . Preop cardiovascular exam 04/14/2013  . Sepsis ([place]) 06/17/2017  . Septic shock ([place]) 06/18/2017  . SIRS (systemic inflammatory response syndrome) ([place]) 07/11/2017  . Stroke [facility]) 2012   no deficits  . Thrombocytopenia ([place])    FamHx:  Family History  Problem Relation Age of Onset  . Hypertension Father   . Diabetes Father   . Dementia Mother   . Lupus Sister    SocHx:  reports that he quit smoking about 2 years ago. His smoking use included cigarettes. He has a 5.00 pack-year smoking history. He has never used smokeless tobacco. He reports current alcohol use. He reports current drug use. Drugs: Marijuana and Heroin.  Exam: Vital signs in last 24 hours: Temp:  [97.6 F (36.4 C)-98 F (36.7 C)] 97.6 F (36.4 C) (05/25 1055) Pulse Rate:  [76-105] 96 (05/25 1055) Resp:  [10-19] 18 (05/25 1055) BP: (104-137)/(56-99) 120/63 (05/25 1055) SpO2:  [95 %-100 %] 98 % (05/25 1055) Weight:  [61.2 kg-64.7 kg] 64.7 kg (05/25 1055) General: Laying in bed asleep with his hand still  on the plate with a piece of roast beef in his hand, once awake he was friendly and cooperative, poor historian, appear chronically ill Head: normocephalic and atruamatic, +bitemporal wasting HEENT: neck supple Pulmonary: breathing room air comfortably, no evidence of increased work of breathing Psych: flat affect with some reactivity Abdomen: S NT ND Extremities: warm and well perfused x4 Neuro: AOx3, PERRL, gaze neutral Strength 5/5 x4, SILTx4   Assessment and Plan: 63 y.o. man with IVDU, alcoholic cirrhosis, CKD3 with acute on chronic low back pain. MRI L-spine personally reviewed, which shows a large lumbar epidural abscess.   -I discussed the above with the patient. His abscess is significant and I think there is a chance it could cause a neurologic deficit in the future. I explained this to him and that he could lose all function in his legs including loss of bowel and bladder control. I also explained that, if he was healthy and wanted to proceed, I would recommend immediate surgical drainage. However, he is a very poor operative candidate with advanced cirrhosis and hepatic dysfunction, renal insufficiency, and thrombocytopenia. He told me that he "does not ever want surgery on his back" and, despite his likely hepatic encephalopathy, he was Ox3 and able to repeat back the risks of not having surgery. I therefore recommend IR drainage or disc biopsy, if they feel that it is technically possible, to  identify an organism and maximal medical treatment with antibiotics.  Judith Part, MD 09/20/18 2:29 PM [facility]

## 2018-09-20 NOTE — Significant Event (Signed)
Hypoglycemic Event  CBG: *52**  Treatment: 4 oz juice/soda graham crackers and peanut butter  Symptoms: Nervous/irritable  Follow-up CBG: Time:*1152** CBG Result:**77*  Possible Reasons for Event: Other: Pt had low reading in ED and was given an amp of D5 at 0858. [name] recheck was 73. Pt was not checked after this, even by request of charge nurse on admitting unit.  Comments/MD notified:     Earney Hamburg

## 2018-09-20 NOTE — ED Provider Notes (Signed)
Signed out by Dr Roxanne Mins to admit to hospitalist service once labs resulted.  Patient with septic arthritis, epidural abscess on MRI. Interestingly, patient had admission 04/2018 with MSSA bacteria, and has hx polysubstance abuse - no clear source bacteria noted then.  Cultures and IV abx ordered by Dr Roxanne Mins.   Labs back - reviewed by me. Mild hyponatremia. +hypoglycemia - D50, ivf. AKI on labs, ivf. Chronic anemia and chronic thrombocytopenia noted. CRP and ESR elevated.   Neurosurgery consulted - NS, Dr Zada Finders , is in [place] now, discussed pt/MRI - they will see later this AM when finished with current case.   Discussed with hospitalist, Dr Tamala Julian - he will admit.   Patient alert, comfortable, nad. No new numbness/weakness, no faintness or dizziness.      Lajean Saver, MD 09/20/18 [phone]

## 2018-09-21 ENCOUNTER — Inpatient Hospital Stay (HOSPITAL_COMMUNITY): Payer: Medicare Other

## 2018-09-21 ENCOUNTER — Other Ambulatory Visit (HOSPITAL_COMMUNITY): Payer: Medicare Other

## 2018-09-21 DIAGNOSIS — K7030 Alcoholic cirrhosis of liver without ascites: Secondary | ICD-10-CM

## 2018-09-21 DIAGNOSIS — F1110 Opioid abuse, uncomplicated: Secondary | ICD-10-CM

## 2018-09-21 DIAGNOSIS — I2510 Atherosclerotic heart disease of native coronary artery without angina pectoris: Secondary | ICD-10-CM

## 2018-09-21 DIAGNOSIS — B9562 Methicillin resistant Staphylococcus aureus infection as the cause of diseases classified elsewhere: Secondary | ICD-10-CM

## 2018-09-21 DIAGNOSIS — R7881 Bacteremia: Secondary | ICD-10-CM

## 2018-09-21 DIAGNOSIS — M4656 Other infective spondylopathies, lumbar region: Secondary | ICD-10-CM

## 2018-09-21 DIAGNOSIS — F1010 Alcohol abuse, uncomplicated: Secondary | ICD-10-CM

## 2018-09-21 DIAGNOSIS — N183 Chronic kidney disease, stage 3 (moderate): Secondary | ICD-10-CM

## 2018-09-21 DIAGNOSIS — Z8619 Personal history of other infectious and parasitic diseases: Secondary | ICD-10-CM

## 2018-09-21 DIAGNOSIS — Z720 Tobacco use: Secondary | ICD-10-CM

## 2018-09-21 DIAGNOSIS — N179 Acute kidney failure, unspecified: Secondary | ICD-10-CM

## 2018-09-21 DIAGNOSIS — I129 Hypertensive chronic kidney disease with stage 1 through stage 4 chronic kidney disease, or unspecified chronic kidney disease: Secondary | ICD-10-CM

## 2018-09-21 DIAGNOSIS — G061 Intraspinal abscess and granuloma: Principal | ICD-10-CM

## 2018-09-21 LAB — URINALYSIS, ROUTINE W REFLEX MICROSCOPIC
Bilirubin Urine: NEGATIVE
Glucose, UA: NEGATIVE mg/dL
Ketones, ur: NEGATIVE mg/dL
Leukocytes,Ua: NEGATIVE
Nitrite: NEGATIVE
Protein, ur: NEGATIVE mg/dL
Specific Gravity, Urine: 1.014 (ref 1.005–1.030)
pH: 5 (ref 5.0–8.0)

## 2018-09-21 LAB — BLOOD CULTURE ID PANEL (REFLEXED)

## 2018-09-21 LAB — RAPID URINE DRUG SCREEN, HOSP PERFORMED
Amphetamines: NOT DETECTED
Barbiturates: NOT DETECTED
Benzodiazepines: NOT DETECTED
Cocaine: NOT DETECTED
Opiates: POSITIVE — AB
Tetrahydrocannabinol: NOT DETECTED

## 2018-09-21 LAB — COMPREHENSIVE METABOLIC PANEL
ALT: 12 U/L (ref 0–44)
AST: 30 U/L (ref 15–41)
Albumin: 1.3 g/dL — ABNORMAL LOW (ref 3.5–5.0)
Alkaline Phosphatase: 94 U/L (ref 38–126)
Anion gap: 10 (ref 5–15)
BUN: 37 mg/dL — ABNORMAL HIGH (ref 8–23)
CO2: 16 mmol/L — ABNORMAL LOW (ref 22–32)
Calcium: 7.4 mg/dL — ABNORMAL LOW (ref 8.9–10.3)
Chloride: 103 mmol/L (ref 98–111)
Creatinine, Ser: 2.4 mg/dL — ABNORMAL HIGH (ref 0.61–1.24)
GFR calc Af Amer: 32 mL/min — ABNORMAL LOW (ref >60)
GFR calc non Af Amer: 27 mL/min — ABNORMAL LOW (ref >60)
Glucose, Bld: 99 mg/dL (ref 70–99)
Potassium: 4.1 mmol/L (ref 3.5–5.1)
Sodium: 129 mmol/L — ABNORMAL LOW (ref 135–145)
Total Bilirubin: 1.6 mg/dL — ABNORMAL HIGH (ref 0.3–1.2)
Total Protein: 5.2 g/dL — ABNORMAL LOW (ref 6.5–8.1)

## 2018-09-21 LAB — AMMONIA: Ammonia: 114 umol/L — ABNORMAL HIGH (ref 9–35)

## 2018-09-21 LAB — CBC
HCT: 24.3 % — ABNORMAL LOW (ref 39.0–52.0)
Hemoglobin: 8.2 g/dL — ABNORMAL LOW (ref 13.0–17.0)
MCH: 31.9 pg (ref 26.0–34.0)
MCHC: 33.7 g/dL (ref 30.0–36.0)
MCV: 94.6 fL (ref 80.0–100.0)
Platelets: 53 10*3/uL — ABNORMAL LOW (ref 150–400)
RBC: 2.57 MIL/uL — ABNORMAL LOW (ref 4.22–5.81)
RDW: 17.8 % — ABNORMAL HIGH (ref 11.5–15.5)
WBC: 7.9 10*3/uL (ref 4.0–10.5)
nRBC: 0 % (ref 0.0–0.2)

## 2018-09-21 LAB — GLUCOSE, CAPILLARY
Glucose-Capillary: 70 mg/dL (ref 70–99)
Glucose-Capillary: 76 mg/dL (ref 70–99)
Glucose-Capillary: 77 mg/dL (ref 70–99)
Glucose-Capillary: 85 mg/dL (ref 70–99)
Glucose-Capillary: 87 mg/dL (ref 70–99)
Glucose-Capillary: 92 mg/dL (ref 70–99)
Glucose-Capillary: 99 mg/dL (ref 70–99)

## 2018-09-21 LAB — PREALBUMIN: Prealbumin: <5 mg/dL — ABNORMAL LOW (ref 18–38)

## 2018-09-21 MED ORDER — CEFAZOLIN SODIUM-DEXTROSE 2-4 GM/100ML-% IV SOLN
2.0000 g | Freq: Three times a day (TID) | INTRAVENOUS | Status: DC
  Administered 2018-09-21: 2 g via INTRAVENOUS
  Filled 2018-09-21 (×2): qty 100

## 2018-09-21 MED ORDER — SODIUM CHLORIDE 0.9 % IV SOLN
INTRAVENOUS | Status: DC
  Administered 2018-09-21 – 2018-09-23 (×5): via INTRAVENOUS

## 2018-09-21 MED ORDER — ENSURE ENLIVE PO LIQD
237.0000 mL | Freq: Three times a day (TID) | ORAL | Status: DC
  Administered 2018-09-21 – 2018-09-28 (×7): 237 mL via ORAL

## 2018-09-21 MED ORDER — SODIUM CHLORIDE 0.9% FLUSH
10.0000 mL | INTRAVENOUS | Status: DC | PRN
  Administered 2018-09-27: 20 mL
  Administered 2018-09-28: 10 mL
  Filled 2018-09-21 (×2): qty 40

## 2018-09-21 MED ORDER — SODIUM CHLORIDE 0.9 % IV SOLN
500.0000 mg | INTRAVENOUS | Status: DC
  Administered 2018-09-21 – 2018-09-23 (×2): 500 mg via INTRAVENOUS
  Filled 2018-09-21 (×2): qty 10

## 2018-09-21 MED ORDER — OXYCODONE HCL 5 MG PO TABS
5.0000 mg | ORAL_TABLET | ORAL | Status: DC | PRN
  Administered 2018-09-21 – 2018-09-22 (×4): 10 mg via ORAL
  Administered 2018-09-22: 5 mg via ORAL
  Administered 2018-09-22 (×2): 10 mg via ORAL
  Filled 2018-09-21 (×4): qty 2
  Filled 2018-09-21: qty 1
  Filled 2018-09-21 (×2): qty 2
  Filled 2018-09-21: qty 1

## 2018-09-21 NOTE — Progress Notes (Signed)
Patient ID: Mark [name], male   DOB: [birth year], 63 y.o.   MRN: [id number]  PROGRESS NOTE    Antoinette Haskett  HYW:[id number] DOB: May 20, 1955 DOA: 09/19/2018 PCP: [name], MD   Brief Narrative:  63 year old male with history of alcoholic liver cirrhosis, pancytopenia, hypertension, coronary artery disease, polysubstance abuse (alcohol, heroin, marijuana), CKD stage III and MSSA bacteremia in 04/2018 presented with worsening lower back pain.  COVID-19 rapid testing was negative on admission.  MRI of the lumbar spine revealed enhancement throughout the epidural space of L3 through the sacrum concerning for possible phlegmon/abscess, L5-S1 marrow enhancement concerning for septic arthritis, and spinal stenosis L3-S1. He was started on broad-spectrum antibiotics.  Neurosurgery was consulted.  Assessment & Plan:   Principal Problem:   Septic arthritis ([place]) Active Problems:   Cirrhosis, alcoholic ([place])   Polysubstance abuse ([place])   Hyponatremia   Pancytopenia ([name])   Hepatic encephalopathy ([name])   Alcohol abuse   Anemia of chronic disease   Hypoalbuminemia   Hypoglycemia without diagnosis of diabetes mellitus   Epidural abscess   Septic arthritis/epidural abscess of lumbar spine -MRI of the lumbar spine revealed enhancement throughout the epidural space of L3 through the sacrum concerning for possible phlegmon/abscess, L5-S1 marrow enhancement concerning for septic arthritis, and spinal stenosis L3-S1 -Patient was initially started on broad-spectrum antibiotics vancomycin and Zosyn.  Patient has been subsequently switched to daptomycin per ID. -Neurosurgery has been consulted and had recommended IR consultation for probable drainage/biopsy.  As per IR, there was no abscess/collection large enough to aspirate or drain.  IR has notified Dr. Zada Finders.  I also have notified Dr. Zada Finders regarding the same. -No evidence of loss of bowel or bladder control for now -Continue  pain management  MRSA bacteremia -Susceptibilities pending.  ID has been consulted automatically.  Currently on daptomycin.  Follow repeat cultures.  Will get 2D echo.  Acute kidney injury with metabolic acidosis -Baseline creatinine of 0.95 on 08/22/2018.  Patient on Lasix and spironolactone at home which is on hold. -Presented with creatinine of 1.82.  Creatinine has worsened today at 2.4 -Strict input and output. -Switch IV fluids to normal saline at 125 cc an hour. -Renal ultrasound.  Alcoholic cirrhosis with hepatic encephalopathy -Patient is more awake this morning.  Poor historian. -Continue lactulose. -Diuretics on hold. -Continue nadolol  Polysubstance abuse -Patient admits to alcohol and recent heroin use prior to arrival -Continue CIWA protocol.  Social worker consult.  Continue multivitamin, thiamine and folate. -Be careful with use of sedatives because of history of alcoholic cirrhosis and encephalopathy  Hyponatremia -Sodium 130 on admission.  129 today.  Probably from hypovolemia.  Will give IV fluids as above.  Hypoalbuminemia -Nutrition consult.  Generalized deconditioning -Overall prognosis is very poor.  Patient is noncompliant and actively using drugs.  He has   DVT prophylaxis: SCDs for now till we decide if there is going to be surgical intervention Code Status: Full Family Communication: None at bedside Disposition Plan: Depends on clinical outcome  Consultants: Neurosurgery/ID consulted automatically because of positive blood cultures  Procedures: None  Antimicrobials: 1 dose of vancomycin Zosyn and Ancef on 09/20/2018 Daptomycin from 09/21/2018 onwards   Subjective: Patient seen and examined at bedside.  He is awake but a poor historian.  Still complains of back pain.  Denies any bowel or bladder incontinence.  No overnight fever or vomiting.  Objective: Vitals:   09/20/18 2250 09/21/18 0019 09/21/18 0204 09/21/18 0410  BP: 106/62 (!) 105/56  116/66 (!) 115/59  Pulse: 88 86 89 85  Resp: 18 18 18 18   Temp: 98.5 F (36.9 C) 98.2 F (36.8 C) 98.1 F (36.7 C) (!) 97.5 F (36.4 C)  TempSrc: Oral Oral  Oral  SpO2: 96% 99% 98% 98%  Weight:      Height:        Intake/Output Summary (Last 24 hours) at 09/21/2018 1110 Last data filed at 09/21/2018 0500 Gross per 24 hour  Intake 1966.06 ml  Output 0 ml  Net 1966.06 ml   Filed Weights   09/19/18 2011 09/20/18 1055  Weight: 61.2 kg 64.7 kg    Examination:  General exam: Appears calm and comfortable.  Very poor historian.  Awake and answers some of the questions. Respiratory system: Bilateral decreased breath sounds at bases, some scattered crackles Cardiovascular system: S1 & S2 heard, Rate controlled Gastrointestinal system: Abdomen is nondistended, soft and nontender. Normal bowel sounds heard. Extremities: No cyanosis, clubbing, edema  Central nervous system: Alert and oriented.  Very poor historian.  No focal neurological deficits. Moving extremities Skin: No rashes, lesions or ulcers Psychiatry: Flat affect.    Data Reviewed: I have personally reviewed following labs and imaging studies  CBC: Recent Labs  Lab 09/20/18 0726 09/21/18 0546  WBC 4.9 7.9  NEUTROABS 4.1  --   HGB 9.1* 8.2*  HCT 28.2* 24.3*  MCV 98.3 94.6  PLT PLATELETS APPEAR DECREASED 53*   Basic Metabolic Panel: Recent Labs  Lab 09/20/18 0726 09/21/18 0546  NA 130* 129*  K 4.2 4.1  CL 101 103  CO2 18* 16*  GLUCOSE 56* 99  BUN 26* 37*  CREATININE 1.82* 2.40*  CALCIUM 8.1* 7.4*   GFR: Estimated Creatinine Clearance: 26.7 mL/min (A) (by C-G formula based on SCr of 2.4 mg/dL (H)). Liver Function Tests: Recent Labs  Lab 09/20/18 0726 09/21/18 0546  AST 34 30  ALT 15 12  ALKPHOS 120 94  BILITOT 1.8* 1.6*  PROT 6.0* 5.2*  ALBUMIN 1.7* 1.3*   No results for input(s): LIPASE, AMYLASE in the last 168 hours. Recent Labs  Lab 09/20/18 0930 09/21/18 0546  AMMONIA 111* 114*    Coagulation Profile: No results for input(s): INR, PROTIME in the last 168 hours. Cardiac Enzymes: No results for input(s): CKTOTAL, CKMB, CKMBINDEX, TROPONINI in the last 168 hours. BNP (last 3 results) No results for input(s): PROBNP in the last 8760 hours. HbA1C: No results for input(s): HGBA1C in the last 72 hours. CBG: Recent Labs  Lab 09/20/18 1603 09/20/18 2016 09/21/18 0017 09/21/18 0408 09/21/18 0825  GLUCAP 68* 75 85 99 92   Lipid Profile: No results for input(s): CHOL, HDL, LDLCALC, TRIG, CHOLHDL, LDLDIRECT in the last 72 hours. Thyroid Function Tests: No results for input(s): TSH, T4TOTAL, FREET4, T3FREE, THYROIDAB in the last 72 hours. Anemia Panel: No results for input(s): VITAMINB12, FOLATE, FERRITIN, TIBC, IRON, RETICCTPCT in the last 72 hours. Sepsis Labs: Recent Labs  Lab 09/20/18 0727 09/20/18 0905 09/20/18 1157  LATICACIDVEN 2.1* 2.8* 2.6*    Recent Results (from the past 240 hour(s))  Culture, blood (routine x 2)     Status: Abnormal (Preliminary result)   Collection Time: 09/20/18  7:25 AM  Result Value Ref Range Status   Specimen Description BLOOD RIGHT ANTECUBITAL  Final   Special Requests   Final    BOTTLES DRAWN AEROBIC AND ANAEROBIC Blood Culture results may not be optimal due to an excessive volume of blood received in culture bottles   Culture  Setup Time   Final    IN BOTH AEROBIC AND ANAEROBIC BOTTLES GRAM POSITIVE COCCI CRITICAL RESULT CALLED TO, READ BACK BY AND VERIFIED WITH: G. ABBOTT,PHARMD 0226 09/21/2018 T. TYSOR    Culture (A)  Final    STAPHYLOCOCCUS AUREUS SUSCEPTIBILITIES TO FOLLOW Performed at [facility], [address], [place], [place] [address]    Report Status PENDING  Incomplete  Blood Culture ID Panel (Reflexed)     Status: Abnormal   Collection Time: 09/20/18  7:25 AM  Result Value Ref Range Status   Enterococcus species NOT DETECTED NOT DETECTED Final   Listeria monocytogenes NOT DETECTED NOT DETECTED  Final   Staphylococcus species DETECTED (A) NOT DETECTED Final    Comment: CRITICAL RESULT CALLED TO, READ BACK BY AND VERIFIED WITH: Nadean Corwin [id number] 09/21/2018 T. TYSOR    Staphylococcus aureus (BCID) DETECTED (A) NOT DETECTED Final    Comment: Methicillin (oxacillin)-resistant Staphylococcus aureus (MRSA). MRSA is predictably resistant to beta-lactam antibiotics (except ceftaroline). Preferred therapy is vancomycin unless clinically contraindicated. Patient requires contact precautions if  hospitalized. CRITICAL RESULT CALLED TO, READ BACK BY AND VERIFIED WITH: G. ABBOTT,PHARMD 0226 09/21/2018 T. TYSOR    Methicillin resistance DETECTED (A) NOT DETECTED Final    Comment: CRITICAL RESULT CALLED TO, READ BACK BY AND VERIFIED WITH: G. ABBOTT,PHARMD 0226 09/21/2018 T. TYSOR    Streptococcus species NOT DETECTED NOT DETECTED Final   Streptococcus agalactiae NOT DETECTED NOT DETECTED Final   Streptococcus pneumoniae NOT DETECTED NOT DETECTED Final   Streptococcus pyogenes NOT DETECTED NOT DETECTED Final   Acinetobacter baumannii NOT DETECTED NOT DETECTED Final   Enterobacteriaceae species NOT DETECTED NOT DETECTED Final   Enterobacter cloacae complex NOT DETECTED NOT DETECTED Final   Escherichia coli NOT DETECTED NOT DETECTED Final   Klebsiella oxytoca NOT DETECTED NOT DETECTED Final   Klebsiella pneumoniae NOT DETECTED NOT DETECTED Final   Proteus species NOT DETECTED NOT DETECTED Final   Serratia marcescens NOT DETECTED NOT DETECTED Final   Haemophilus influenzae NOT DETECTED NOT DETECTED Final   Neisseria meningitidis NOT DETECTED NOT DETECTED Final   Pseudomonas aeruginosa NOT DETECTED NOT DETECTED Final   Candida albicans NOT DETECTED NOT DETECTED Final   Candida glabrata NOT DETECTED NOT DETECTED Final   Candida krusei NOT DETECTED NOT DETECTED Final   Candida parapsilosis NOT DETECTED NOT DETECTED Final   Candida tropicalis NOT DETECTED NOT DETECTED Final    Comment:  Performed at [facility], [place]. [address], [place], [place] [address]  Culture, blood (routine x 2)     Status: None (Preliminary result)   Collection Time: 09/20/18  7:34 AM  Result Value Ref Range Status   Specimen Description BLOOD LEFT ANTECUBITAL  Final   Special Requests   Final    BOTTLES DRAWN AEROBIC AND ANAEROBIC Blood Culture adequate volume   Culture  Setup Time   Final    IN BOTH AEROBIC AND ANAEROBIC BOTTLES GRAM POSITIVE COCCI CRITICAL RESULT CALLED TO, READ BACK BY AND VERIFIED WITH: Salli Real [id number] 09/21/2018 Mena Goes Performed at [facility], [place] [address] [place], [place] [address]    Culture [facility] POSITIVE COCCI  Final   Report Status PENDING  Incomplete  SARS Coronavirus 2 (CEPHEID - Performed in [place] hospital lab), Hosp Order     Status: None   Collection Time: 09/20/18  7:34 AM  Result Value Ref Range Status   SARS Coronavirus 2 NEGATIVE NEGATIVE Final    Comment: (  NOTE) If result is NEGATIVE SARS-CoV-2 target nucleic acids are NOT DETECTED. The SARS-CoV-2 RNA is generally detectable in upper and lower  respiratory specimens during the acute phase of infection. The lowest  concentration of SARS-CoV-2 viral copies this assay can detect is 250  copies / mL. A negative result does not preclude SARS-CoV-2 infection  and should not be used as the sole basis for treatment or other  patient management decisions.  A negative result may occur with  improper specimen collection / handling, submission of specimen other  than nasopharyngeal swab, presence of viral mutation(s) within the  areas targeted by this assay, and inadequate number of viral copies  (<250 copies / mL). A negative result must be combined with clinical  observations, patient history, and epidemiological information. If result is POSITIVE SARS-CoV-2 target nucleic acids are DETECTED. The SARS-CoV-2 RNA is generally detectable in upper and lower  respiratory specimens dur  ing the acute phase of infection.  Positive  results are indicative of active infection with SARS-CoV-2.  Clinical  correlation with patient history and other diagnostic information is  necessary to determine patient infection status.  Positive results do  not rule out bacterial infection or co-infection with other viruses. If result is PRESUMPTIVE POSTIVE SARS-CoV-2 nucleic acids MAY BE PRESENT.   A presumptive positive result was obtained on the submitted specimen  and confirmed on repeat testing.  While 2019 novel coronavirus  (SARS-CoV-2) nucleic acids may be present in the submitted sample  additional confirmatory testing may be necessary for epidemiological  and / or clinical management purposes  to differentiate between  SARS-CoV-2 and other Sarbecovirus currently known to infect humans.  If clinically indicated additional testing with an alternate test  methodology [phone]) is advised. The SARS-CoV-2 RNA is generally  detectable in upper and lower respiratory sp ecimens during the acute  phase of infection. The expected result is Negative. Fact Sheet for Patients:  StrictlyIdeas.no Fact Sheet for Healthcare Providers: BankingDealers.co.za This test is not yet approved or cleared by the [place] FDA and has been authorized for detection and/or diagnosis of SARS-CoV-2 by FDA under an Emergency Use Authorization (EUA).  This EUA will remain in effect (meaning this test can be used) for the duration of the COVID-19 declaration under Section 564(b)(1) of the Act, 21 U.S.C. section 360bbb-3(b)(1), unless the authorization is terminated or revoked sooner. Performed at [facility], [place] [address], [place],  [address]          Radiology Studies: Mr Lumbar Spine W Wo Contrast  Result Date: 09/20/2018 CLINICAL DATA:  Initial evaluation for persistent low back pain for several weeks. EXAM: MRI LUMBAR SPINE WITHOUT  AND WITH CONTRAST TECHNIQUE: Multiplanar and multiecho pulse sequences of the lumbar spine were obtained without and with intravenous contrast. CONTRAST:  60 cc of Gadavist. COMPARISON:  None available. FINDINGS: Segmentation: Standard. Lowest well-formed disc labeled the L5-S1 level. Alignment: Vertebral bodies normally aligned with preservation of the normal lumbar lordosis. No listhesis or subluxation. Vertebrae: Vertebral body heights maintained without evidence for acute or chronic fracture. Bone marrow signal intensity diffusely heterogeneous without discrete or worrisome osseous lesions. Reactive endplate changes present about the L3-4, L4-5, and L5-S1 interspaces. Fluid density present within the right aspect of the L4-5 interspace without significant endplate irregularity or enhancement, favored to be degenerative. Mild edema and enhancement about the L5-S1 interspace also favored to be degenerative. There is abnormal edema and enhancement about the L5-S1 facets, most notable on the right.  Associated small bilateral joint effusions. While these findings could be degenerative in nature, possible septic arthritis should be considered. Possible involvement at L4-5 on the right as well. Conus medullaris and cauda equina: Conus extends to the L2 level. Conus medullaris within normal limits. There is abnormal epidural enhancement involving the lower lumbar spinal canal, primarily extending from L3 through the sacrum. Associated epidural collection within the ventral epidural space most likely reflects abscess and/or phlegmon. This measures approximately 0.6 x 1.9 x 4.4 cm in greatest dimensions (AP by transverse by craniocaudad), most prominent at the L4 and L5 levels (series 14, image 8). Additional component seen posteriorly within the dorsal epidural space, most notable at L5-S1 on the right (series 15, image 23). Probable associated subdural and/or intradural effusion with mild displacement of the distal  nerve roots of the cauda equina at the level of L2-3 (series 8, image 12). Paraspinal and other soft tissues: Mild soft tissue edema and enhancement adjacent to the right L4-5 and L5-S1 facets. Similarly, edema and enhancement present within the right psoas muscle extending from L3-4 through L5-S1, consistent with infection. No discrete soft tissue abscess or drainable fluid collection. Disc levels: L1-2: Negative interspace. Mild bilateral facet hypertrophy. No canal or foraminal stenosis. L2-3: Negative interspace. Mild bilateral facet and ligament flavum hypertrophy. Mild concentric epidural phlegmon/abscess with probable subdural effusion. Nerve roots of the cauda equina mildly compressed (series 8, image 12). Thecal sac remains patent. No foraminal stenosis. L3-4: Chronic intervertebral disc space narrowing with diffuse disc bulge and disc desiccation. Mild reactive endplate changes. Moderate facet and ligament flavum hypertrophy. Concentric epidural phlegmon/abscess. Resultant moderate spinal stenosis. Mild bilateral L3 foraminal narrowing. L4-5: Chronic intervertebral disc space narrowing with diffuse disc bulge and disc desiccation. Mild reactive endplate changes. Moderate facet and ligament flavum hypertrophy. Associated bilateral joint effusions. Epidural phlegmon/abscess, most notable within the ventral epidural space at the level of the left lateral recesses at the level of L4 and L5 (series 8, images 21, 25). Resultant severe canal with left worse than right lateral recess stenosis. Moderate bilateral L4 foraminal narrowing, right worse than left. L5-S1: Chronic intervertebral disc space narrowing with disc desiccation and diffuse disc bulge. Chronic reactive endplate changes with marginal endplate osteophytic spurring. Moderate to advanced right worse than left facet degeneration. Bilateral joint effusions, with concern for possible septic arthritis, right greater than left. Epidural phlegmon/abscess,  most notable at the left lateral recess and right dorsal epidural space. Moderate to severe canal and bilateral foraminal stenosis. IMPRESSION: 1. Abnormal enhancement throughout the epidural space extending from approximately L3 through the sacrum, concerning for infection. Superimposed epidural phlegmon/abscess as above, most notable within the ventral epidural space at the levels of L4 and L5. 2. Abnormal marrow edema and enhancement about the right greater than left L5-S1 facets, suggesting septic arthritis. Finding could be source of infection. 3. Associated moderate to severe spinal stenosis at L3-4 through L5-S1, with moderate to advanced bilateral L4 and L5 foraminal stenosis as above. 4. Associated lower right paraspinous soft tissue edema and enhancement without discrete abscess or drainable fluid collection. Electronically Signed   By: Jeannine Boga M.D.   On: 09/20/2018 06:58        Scheduled Meds: . folic acid  1 mg Oral Daily  . lactulose  30 g Oral BID  . LORazepam  0-4 mg Intravenous Q6H   Followed by  . [START ON 09/22/2018] LORazepam  0-4 mg Intravenous Q12H  . multivitamin with minerals  1 tablet Oral Daily  .  nadolol  20 mg Oral Daily  . sodium chloride flush  3 mL Intravenous Q12H  . thiamine  100 mg Oral Daily   Continuous Infusions: . DAPTOmycin (CUBICIN)  IV 500 mg (09/21/18 1038)  . dextrose 100 mL/hr at 09/21/18 0113     LOS: 1 day        Aline August, MD [facility] 09/21/2018, 11:10 AM

## 2018-09-21 NOTE — Progress Notes (Signed)
PHARMACY - PHYSICIAN COMMUNICATION CRITICAL VALUE ALERT - BLOOD CULTURE IDENTIFICATION (BCID)  Mark [name] is an 63 y.o. male who presented to [facility] on 09/19/2018 with a chief complaint of back pain, possible epidural abcess/septic arthritis  Assessment:  2/2 blood cultures growing MSSA  Name of physician (or Provider) Contacted:  Tylene Fantasia  Current antibiotics: Vancomycin and Zosyn   Changes to prescribed antibiotics recommended:  Will change to Ancef 2 g  IV q8h F/U renal fxn  Results for orders placed or performed during the hospital encounter of 05/10/18  Blood Culture ID Panel (Reflexed) (Collected: 05/10/2018  4:52 AM)  Result Value Ref Range   Enterococcus species NOT DETECTED NOT DETECTED   Listeria monocytogenes NOT DETECTED NOT DETECTED   Staphylococcus species DETECTED (A) NOT DETECTED   Staphylococcus aureus (BCID) DETECTED (A) NOT DETECTED   Methicillin resistance NOT DETECTED NOT DETECTED   Streptococcus species NOT DETECTED NOT DETECTED   Streptococcus agalactiae NOT DETECTED NOT DETECTED   Streptococcus pneumoniae NOT DETECTED NOT DETECTED   Streptococcus pyogenes NOT DETECTED NOT DETECTED   Acinetobacter baumannii NOT DETECTED NOT DETECTED   Enterobacteriaceae species NOT DETECTED NOT DETECTED   Enterobacter cloacae complex NOT DETECTED NOT DETECTED   Escherichia coli NOT DETECTED NOT DETECTED   Klebsiella oxytoca NOT DETECTED NOT DETECTED   Klebsiella pneumoniae NOT DETECTED NOT DETECTED   Proteus species NOT DETECTED NOT DETECTED   Serratia marcescens NOT DETECTED NOT DETECTED   Haemophilus influenzae NOT DETECTED NOT DETECTED   Neisseria meningitidis NOT DETECTED NOT DETECTED   Pseudomonas aeruginosa NOT DETECTED NOT DETECTED   Candida albicans NOT DETECTED NOT DETECTED   Candida glabrata NOT DETECTED NOT DETECTED   Candida krusei NOT DETECTED NOT DETECTED   Candida parapsilosis NOT DETECTED NOT DETECTED   Candida tropicalis NOT DETECTED NOT  DETECTED    Caryl Pina 09/21/2018  2:27 AM

## 2018-09-21 NOTE — Progress Notes (Addendum)
Pharmacy Antibiotic Note  Mark [name] is a 63 y.o. male admitted on 09/19/2018 with septic arthritis.  Pharmacy has been consulted for daptomycin dosing. Blood cultures from 5/25 growing MRSA. Patient currently afebrile, Scr increased from 1.82 to 2.40 (CrCl ~26 mL/min), and WBC up from 4.9 to 7.9.   Plan: Start Daptomycin 500 mg IV q48h Obtain baseline CK on 5/27 Monitor Scr for further dose adjustments Monitor C&S and LOT plans   Height: 5\' 5"  (165.1 cm) Weight: 142 lb 10.2 oz (64.7 kg) IBW/kg (Calculated) : 61.5  Temp (24hrs), Avg:98.1 F (36.7 C), Min:97.5 F (36.4 C), Max:98.5 F (36.9 C)  Recent Labs  Lab 09/20/18 0726 09/20/18 0727 09/20/18 0905 09/20/18 1157 09/21/18 0546  WBC 4.9  --   --   --  7.9  CREATININE 1.82*  --   --   --  2.40*  LATICACIDVEN  --  2.1* 2.8* 2.6*  --     Estimated Creatinine Clearance: 26.7 mL/min (A) (by C-G formula based on SCr of 2.4 mg/dL (H)).    No Known Allergies  Antimicrobials this admission: 5/25 Vancomycin>>5/25 5/25 Zosyn>>5/25 5/26 Cefazolin x 1 dose 5/26 Daptomycin >>  Dose adjustments this admission: N/A  Microbiology results: 5/25 BCx: MRSA 5/25 Covid negative  Thank you for allowing pharmacy to be a part of this patient's care.  Leron Croak, PharmD PGY1 Pharmacy Resident Phone: [phone]  Please check AMION for all [facility] phone numbers 09/21/2018 9:01 AM

## 2018-09-21 NOTE — Progress Notes (Signed)
Patient ID: Mark [name], male   DOB: Sep 25, 1955, 63 y.o.   MRN: [id number]   Request for epidural abscess vs disc aspiration  I have reviewed imaging with Dr Anselm Pancoast and Dr Estanislado Pandy NO abscess/collection large enough top aspirate or drain NO active discitis  NO IR procedure   Will cancel order  Message to Dr Zada Finders and Dr Starla Link

## 2018-09-21 NOTE — Progress Notes (Signed)
Primary nurse told this writer to hold off on the PIV. Per RN patient is a possible PICC insertion r/t long term antibiotic. He will update IV VAST team.

## 2018-09-21 NOTE — Progress Notes (Signed)
Notified by primary team that IR does not feel they are able to safely aspirate fluid collection, which I agree is very reasonable in the absence of a clear discitis or paraspinal collection. Per ID notes, pt would be interested in discussing surgery further. Some of the pt's labs were pending yesterday when I saw him. His platelet count is 50 and his labs, by my calculation, put him at Child-Pugh class B, which is predictive of a very high complication rate for spine surgery, somewhere between 60-80%. Combined with his thrombocytopenia, I think he is too high risk for surgery at this time. Even with a minimally invasive approach, hemostasis can be difficult and post-operative epidural hematoma can lead to serious harm. Given that his neurologic function is currently intact, I think that we should see if he can clear the infection without surgery, which I understand is certainly suboptimal from an ID standpoint. If he develops any neurologic deficits or is unable to clear the infection, then I can discuss with the patient about the risks/benefits of surgery. But, even if there is a small chance that he can clear the abscess non-surgically, we should pursue that chance to avoid a high morbidity operation.

## 2018-09-21 NOTE — Progress Notes (Signed)
Initial Nutrition Assessment  RD working remotely.  DOCUMENTATION CODES:   Not applicable  INTERVENTION:   -Continue MVI with minerals daily -Ensure Enlive po TID, each supplement provides 350 kcal and 20 grams of protein  NUTRITION DIAGNOSIS:   Increased nutrient needs related to chronic illness(alcoholic cirrhosis) as evidenced by estimated needs.  GOAL:   Patient will meet greater than or equal to 90% of their needs  MONITOR:   PO intake, Supplement acceptance, Labs, Weight trends, Skin, I & O's  REASON FOR ASSESSMENT:   Consult Assessment of nutrition requirement/status, Diet education  ASSESSMENT:   Mark [name] is a 63 y.o. male with medical history significant of alcoholic liver cirrhosis, pancytopenia, HTN, CAD, polysubstance abuse(alcohol, heroin, marijuana), CKD stage III, and MSSA bacteremia in 04/2018; who presents with complaints of worsening lower back pain.  Pain is described as severe. Reports that he has had this pain intermittently for years, but has been more constant.  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  Denies having any significant fever, change in bowel habits, nausea, vomiting, diarrhea, shortness of breath, or chest pain.  He tried over-the-counter NSAIDs without relief.  Patient still reports drinking beer on a daily basis and last reports snorting heroin prior to coming into the emergency department.  In January patient was admitted for MSSA bacteremia and plan was to treat with 3 weeks of IV Ancef to be stopped originally on 1/27.  However, due to home health concerns of going to the patient's house IV antibiotics were discontinued on 1/24 with removal of the PICC line after discussions with infectious disease.  Patient had just recently been seen in the emergency department for left lower extremity cellulitis on 4/26 treated with oral antibiotics of Bactrim and topical ketoconazole.  Denies  having any issues with large left lower extremity at this time.  Lastly, he admits to eating normal, but states that he has been more sleepy where sometimes will not get up to eat.  Pt admitted with septic arthritis/ epidural abscess of lumbar spine.   Reviewed I/O's: +2.3 L x 24 hours   UOP: 0 ml x 24 hours  Noted pt had a hypoglycemic episode yesterday at 1119.   Per neurosurgery notes, pt is a poor surgical candidate and is refusing surgical interventions at this time. IR reports abscess not large enough to drain.  Per RN notes, potential of PICC line placement today for long term IV antibiotics.   Attempted to speak with pt via phone, however, unable to reach (no answer). Phone message reports "call cannot be completed as dialed". Unable to obtain further nutrition history at this time.   Reviewed wt hx; wt has been stable over the past year.   Per meal completion records, pt consumed only 20% of lunch meal today.  Pt with increased nutrient needs due to sepsis and multiple co-morbidities and would benefit from addition of nutritional supplements. Suspect pt is at high risk for malnutrition, however, unable to identify at this time without additional history and completion of nutrition-focused physical exam.    Medications reviewed and include lactulose, MVI, thiamine, and folic acid.   Albumin has a half-life of 21 days and is strongly affected by stress response and inflammatory process, therefore, do not expect to see an improvement in this lab value during acute hospitalization. When a patient presents with low albumin, it is likely skewed due to the acute inflammatory response.  Unless it  is suspected that patient had poor PO intake or malnutrition prior to admission, then RD should not be consulted solely for low albumin. Note that low albumin is no longer used to diagnose malnutrition; [facility] uses the new malnutrition guidelines published by the American Society for Parenteral  and Enteral Nutrition (A.S.P.E.N.) and the Academy of Nutrition and Dietetics (AND).    Labs reviewed: Na: 129. CBGS: 76-99.   NUTRITION - FOCUSED PHYSICAL EXAM:    Most Recent Value  Orbital Region  Unable to assess  Upper Arm Region  Unable to assess  Thoracic and Lumbar Region  Unable to assess  Buccal Region  Unable to assess  Temple Region  Unable to assess  Clavicle Bone Region  Unable to assess  Clavicle and Acromion Bone Region  Unable to assess  Scapular Bone Region  Unable to assess  Dorsal Hand  Unable to assess  Patellar Region  Unable to assess  Anterior Thigh Region  Unable to assess  Posterior Calf Region  Unable to assess  Edema (RD Assessment)  Unable to assess  Hair  Unable to assess  Eyes  Unable to assess  Mouth  Unable to assess  Skin  Unable to assess  Nails  Unable to assess       Diet Order:   Diet Order            Diet regular Room service appropriate? Yes; Fluid consistency: Thin  Diet effective now              EDUCATION NEEDS:   No education needs have been identified at this time  Skin:  Skin Assessment: Reviewed RN Assessment  Last BM:  09/20/18  Height:   Ht Readings from Last 1 Encounters:  09/20/18 5\' 5"  (1.651 m)    Weight:   Wt Readings from Last 1 Encounters:  09/20/18 64.7 kg    Ideal Body Weight:  61.8 kg  BMI:  Body mass index is 23.74 kg/m.  Estimated Nutritional Needs:   Kcal:  2050-2250  Protein:  100-115 grams  Fluid:  > 2 L    Mark [name] A. Jimmye Norman, RD, LDN, [place] Registered Dietitian II Certified Diabetes Care and Education Specialist Pager: [phone] After hours Pager: [phone]

## 2018-09-21 NOTE — Consult Note (Signed)
[facility] for Infectious Disease    Date of Admission:  09/19/2018     Total days of antibiotics 1               Reason for Consult:MRSA Bacteremia / Lumbar Epidural Abscess    Referring Provider: [employer] / [facility] Primary Care Provider: [name], MD   Assessment/Plan:  Mark [name] is a 63 y/o male with multiple medical problems including alcoholic cirrhosis, CKD III, and recent admission in January 2020 with MSSA bacteremia treated for 2 weeks with cefazolin who now has a lumbar epidural abscess and septic arthritis of L3-L4 facet joints complicated with MRSA bacteremia with neurosurgical recommendation for intervention.   MRSA bacteremia - Recheck blood cultures to ensure clearance of bacteremia. TTE ordered to rule out endocarditis. Source appears be lumbar epidural abscess. Continue current dose of Daptomycin. Will need to determine affordability as outpatient.   Lumbar Epidural Abscess and Facet Septic Arthritis - Neurosurgery recommends surgical intervention with Mark [name] refusing surgery at time of evaluation and IR evaluation without significant fluid collection or abscess to aspiration/drain. Discussed in detail options of treatment with the most ideal recommendation for surgical intervention combined with antibiotic therapy. He is amenable to speaking with Dr. Zada Finders regarding the procedure. Will continue current daptomycin.  Chronic Kidney Disease, Stage 3 - Creatinine of 2.40 with baseline around 1.5-2. Given renal concerns will use Daptomycin. Will need central line placement instead of PICC upon discharge depending upon disposition planning.   Alcoholic cirrhosis - Continues to drink at least a 40 ounce beer per day on average. Cirrhosis management on going per primary team and remains on CIWA protocol.   Polysubstance abuse - Mark [name] has used heroin and alcohol prior to admission. Heroin was snorted and he has never used IV drugs because is not  a fan of needles. Did have previous PICC line placed in January, however [place] with concern given his history of polysubstance abuse. This will make his disposition challenging.  Tobacco use -  Continues to smoke about 1/3 pack of cigarettes per day for the last 5 years. Discussed that continued tobacco use when combined with other co-morbidities could complicate healing process.   Healthcare Maintenance - HIV and Hepatitis C negative.    Principal Problem:   Epidural abscess Active Problems:   Septic arthritis ([place])   MRSA bacteremia   Cirrhosis, alcoholic ([place])   Polysubstance abuse ([place])   Hyponatremia   Pancytopenia ([place])   Hepatic encephalopathy ([name])   Alcohol abuse   Anemia of chronic disease   Hypoalbuminemia   Hypoglycemia without diagnosis of diabetes mellitus    folic acid  1 mg Oral Daily   lactulose  30 g Oral BID   LORazepam  0-4 mg Intravenous Q6H   Followed by   Derrill Memo ON 09/22/2018] LORazepam  0-4 mg Intravenous Q12H   multivitamin with minerals  1 tablet Oral Daily   nadolol  20 mg Oral Daily   sodium chloride flush  3 mL Intravenous Q12H   thiamine  100 mg Oral Daily     HPI: Mark [name] is a 63 y.o. male with previous medical history significant for alcoholic liver cirrhosis, hypertension, CAD, CKD Stage III, polysubstance abuse (alcohol, heroin, marijuana) and recent admission for MSSA bacteremia in January 2020 who was admitted with about 1.5 month history of worsening back pain in the absence of trauma or injury.   Mark [name] was previously admitted on 05/10/2018 with  MSSA bacteremia with TEE negative for endocarditis and new findings of severe mitral regurgitation who was treated with 2 weeks of IV therapy with Cefazolin scheduled to stop on 05/24/18 and was subsequently readmitted on 05/18/18 with hepatic encephalopathy. At discharge on 05/21/18 Case Management reported that [place] did no feel comfortable going to Mark [name]  home and IV Cefazolin was stopped at that time with PICC line removed.   Mark [name] has had back pain going on for about 1 year and experienced worsening of the pain within the last 1.5 months. No systemic symptoms of fever, chills or sweats. No radiculopathy is present. Vital signs in the ED were stable and he was afebrile. MRI of the lumbar spine with abnormal enhancement throughout the epidural space extending from approximately L3 through the sacrum concerning for infection with superimposed epidural phlegmon/abscess most notable within the ventral epidural space at levels L4 and L5; Abnormal marrow edema and enhancement suggesting L5-S1 septic arthritis; and associated lower right paraspinous soft tissue edema and enhancement without discrete abscess or drainable fluid collection.  Neurosurgery recommending surgical irrigation and debridement with Mark [name] refusing surgery at the time of evaluation.   Mark [name] has remained afebrile since admission and has received broad-spectrum antibiotics with vancomycin and piperacillin-tazobactam.  Blood cultures drawn on 09/20/2018 positive for MRSA in 4/4 bottles. Antimicrobial therapy has been changed to Daptomycin.  Mark [name] currently lives at home with his wife. He smokes about 1/3 pack of cigarettes per day and is working on quitting. He drinks about 1 40 ounce beer per day on average. Denies history of IV drug use indicating that he is not a fan of needles but has been snorthing heroin to help with his increasing back pain.    Review of Systems: Review of Systems  Constitutional: Negative for chills, fever and weight loss.  Respiratory: Negative for cough, shortness of breath and wheezing.   Cardiovascular: Negative for chest pain and leg swelling.  Gastrointestinal: Negative for abdominal pain, constipation, diarrhea, nausea and vomiting.  Musculoskeletal: Positive for back pain.  Skin: Negative for rash.     Past Medical History:   Diagnosis Date   Alcohol abuse    Anemia    Arthritis    Ascites    Cirrhosis ([name])    Coffee ground emesis    Dehydration 06/17/2017   Febrile illness    Heart murmur    Hyperlipidemia    Hypertension    Leg swelling    Myocardial infarction ([place]) 2012   Preop cardiovascular exam 04/14/2013   Sepsis ([place]) 06/17/2017   Septic shock ([place]) 06/18/2017   SIRS (systemic inflammatory response syndrome) ([place]) 07/11/2017   Stroke ([place]) 2012   no deficits   Thrombocytopenia ([name])     Social History   Tobacco Use   Smoking status: Former Smoker    Packs/day: 0.50    Years: 10.00    Pack years: 5.00    Types: Cigarettes    Last attempt to quit: 01/05/2016    Years since quitting: 2.7   Smokeless tobacco: Never Used   Tobacco comment: 4 cigarettes a day  Substance Use Topics   Alcohol use: Yes   Drug use: Yes    Types: Marijuana, Heroin    Family History  Problem Relation Age of Onset   Hypertension Father    Diabetes Father    Dementia Mother    Lupus Sister     No Known Allergies  OBJECTIVE: Blood pressure (!) 115/59, pulse  85, temperature (!) 97.5 F (36.4 C), temperature source Oral, resp. rate 18, height 5\' 5"  (1.651 m), weight 64.7 kg, SpO2 98 %.  Physical Exam Constitutional:      General: He is not in acute distress.    Appearance: He is well-developed. He is ill-appearing.     Comments: Lying in bed with head of bed slightly elevated; appears older than his stated age.   Cardiovascular:     Rate and Rhythm: Normal rate and regular rhythm.     Heart sounds: Normal heart sounds. No murmur.  Pulmonary:     Effort: Pulmonary effort is normal. No respiratory distress.     Breath sounds: Normal breath sounds. No wheezing, rhonchi or rales.  Chest:     Chest wall: No tenderness.  Abdominal:     General: Bowel sounds are normal. There is no distension.     Palpations: There is no mass.     Tenderness: There is no abdominal  tenderness.  Musculoskeletal:     Comments: Lower lumbar spine tenderness. Distal pulses and sensation are intact and appropriate.  Skin:    General: Skin is warm and dry.  Neurological:     Mental Status: He is alert and oriented to person, place, and time.  Psychiatric:        Mood and Affect: Mood normal.     Lab Results Lab Results  Component Value Date   WBC 7.9 09/21/2018   HGB 8.2 (L) 09/21/2018   HCT 24.3 (L) 09/21/2018   MCV 94.6 09/21/2018   PLT 53 (L) 09/21/2018    Lab Results  Component Value Date   CREATININE 2.40 (H) 09/21/2018   BUN 37 (H) 09/21/2018   NA 129 (L) 09/21/2018   K 4.1 09/21/2018   CL 103 09/21/2018   CO2 16 (L) 09/21/2018    Lab Results  Component Value Date   ALT 12 09/21/2018   AST 30 09/21/2018   ALKPHOS 94 09/21/2018   BILITOT 1.6 (H) 09/21/2018     Microbiology: Recent Results (from the past 240 hour(s))  Culture, blood (routine x 2)     Status: Abnormal (Preliminary result)   Collection Time: 09/20/18  7:25 AM  Result Value Ref Range Status   Specimen Description BLOOD RIGHT ANTECUBITAL  Final   Special Requests   Final    BOTTLES DRAWN AEROBIC AND ANAEROBIC Blood Culture results may not be optimal due to an excessive volume of blood received in culture bottles   Culture  Setup Time   Final    IN BOTH AEROBIC AND ANAEROBIC BOTTLES GRAM POSITIVE COCCI CRITICAL RESULT CALLED TO, READ BACK BY AND VERIFIED WITH: G. ABBOTT,PHARMD 0226 09/21/2018 T. TYSOR    Culture (A)  Final    STAPHYLOCOCCUS AUREUS SUSCEPTIBILITIES TO FOLLOW Performed at [facility], [address], [name], [place] [address]    Report Status PENDING  Incomplete  Blood Culture ID Panel (Reflexed)     Status: Abnormal   Collection Time: 09/20/18  7:25 AM  Result Value Ref Range Status   Enterococcus species NOT DETECTED NOT DETECTED Final   Listeria monocytogenes NOT DETECTED NOT DETECTED Final   Staphylococcus species DETECTED (A) NOT DETECTED Final     Comment: CRITICAL RESULT CALLED TO, READ BACK BY AND VERIFIED WITH: Nadean Corwin [id number] 09/21/2018 T. TYSOR    Staphylococcus aureus (BCID) DETECTED (A) NOT DETECTED Final    Comment: Methicillin (oxacillin)-resistant Staphylococcus aureus (MRSA). MRSA is predictably resistant to beta-lactam antibiotics (  except ceftaroline). Preferred therapy is vancomycin unless clinically contraindicated. Patient requires contact precautions if  hospitalized. CRITICAL RESULT CALLED TO, READ BACK BY AND VERIFIED WITH: G. ABBOTT,PHARMD 0226 09/21/2018 T. TYSOR    Methicillin resistance DETECTED (A) NOT DETECTED Final    Comment: CRITICAL RESULT CALLED TO, READ BACK BY AND VERIFIED WITH: G. ABBOTT,PHARMD 0226 09/21/2018 T. TYSOR    Streptococcus species NOT DETECTED NOT DETECTED Final   Streptococcus agalactiae NOT DETECTED NOT DETECTED Final   Streptococcus pneumoniae NOT DETECTED NOT DETECTED Final   Streptococcus pyogenes NOT DETECTED NOT DETECTED Final   Acinetobacter baumannii NOT DETECTED NOT DETECTED Final   Enterobacteriaceae species NOT DETECTED NOT DETECTED Final   Enterobacter cloacae complex NOT DETECTED NOT DETECTED Final   Escherichia coli NOT DETECTED NOT DETECTED Final   Klebsiella oxytoca NOT DETECTED NOT DETECTED Final   Klebsiella pneumoniae NOT DETECTED NOT DETECTED Final   Proteus species NOT DETECTED NOT DETECTED Final   Serratia marcescens NOT DETECTED NOT DETECTED Final   Haemophilus influenzae NOT DETECTED NOT DETECTED Final   Neisseria meningitidis NOT DETECTED NOT DETECTED Final   Pseudomonas aeruginosa NOT DETECTED NOT DETECTED Final   Candida albicans NOT DETECTED NOT DETECTED Final   Candida glabrata NOT DETECTED NOT DETECTED Final   Candida krusei NOT DETECTED NOT DETECTED Final   Candida parapsilosis NOT DETECTED NOT DETECTED Final   Candida tropicalis NOT DETECTED NOT DETECTED Final    Comment: Performed at [facility], [place]. [address] [place], [place]  [address]  Culture, blood (routine x 2)     Status: None (Preliminary result)   Collection Time: 09/20/18  7:34 AM  Result Value Ref Range Status   Specimen Description BLOOD LEFT ANTECUBITAL  Final   Special Requests   Final    BOTTLES DRAWN AEROBIC AND ANAEROBIC Blood Culture adequate volume   Culture  Setup Time   Final    IN BOTH AEROBIC AND ANAEROBIC BOTTLES GRAM POSITIVE COCCI CRITICAL RESULT CALLED TO, READ BACK BY AND VERIFIED WITH: Salli Real [id number] 09/21/2018 Mena Goes Performed at [facility], [place] [address], [place], [place] [address]    Culture [facility] POSITIVE COCCI  Final   Report Status PENDING  Incomplete  SARS Coronavirus 2 (CEPHEID - Performed in [place] hospital lab), Hosp Order     Status: None   Collection Time: 09/20/18  7:34 AM  Result Value Ref Range Status   SARS Coronavirus 2 NEGATIVE NEGATIVE Final    Comment: (NOTE) If result is NEGATIVE SARS-CoV-2 target nucleic acids are NOT DETECTED. The SARS-CoV-2 RNA is generally detectable in upper and lower  respiratory specimens during the acute phase of infection. The lowest  concentration of SARS-CoV-2 viral copies this assay can detect is 250  copies / mL. A negative result does not preclude SARS-CoV-2 infection  and should not be used as the sole basis for treatment or other  patient management decisions.  A negative result may occur with  improper specimen collection / handling, submission of specimen other  than nasopharyngeal swab, presence of viral mutation(s) within the  areas targeted by this assay, and inadequate number of viral copies  (<250 copies / mL). A negative result must be combined with clinical  observations, patient history, and epidemiological information. If result is POSITIVE SARS-CoV-2 target nucleic acids are DETECTED. The SARS-CoV-2 RNA is generally detectable in upper and lower  respiratory specimens dur ing the acute phase of infection.  Positive  results are indicative  of  active infection with SARS-CoV-2.  Clinical  correlation with patient history and other diagnostic information is  necessary to determine patient infection status.  Positive results do  not rule out bacterial infection or co-infection with other viruses. If result is PRESUMPTIVE POSTIVE SARS-CoV-2 nucleic acids MAY BE PRESENT.   A presumptive positive result was obtained on the submitted specimen  and confirmed on repeat testing.  While 2019 novel coronavirus  (SARS-CoV-2) nucleic acids may be present in the submitted sample  additional confirmatory testing may be necessary for epidemiological  and / or clinical management purposes  to differentiate between  SARS-CoV-2 and other Sarbecovirus currently known to infect humans.  If clinically indicated additional testing with an alternate test  methodology [phone]) is advised. The SARS-CoV-2 RNA is generally  detectable in upper and lower respiratory sp ecimens during the acute  phase of infection. The expected result is Negative. Fact Sheet for Patients:  StrictlyIdeas.no Fact Sheet for Healthcare Providers: BankingDealers.co.za This test is not yet approved or cleared by the [place] FDA and has been authorized for detection and/or diagnosis of SARS-CoV-2 by FDA under an Emergency Use Authorization (EUA).  This EUA will remain in effect (meaning this test can be used) for the duration of the COVID-19 declaration under Section 564(b)(1) of the Act, 21 U.S.C. section 360bbb-3(b)(1), unless the authorization is terminated or revoked sooner. Performed at [facility], [place] [address] [place], [place] [address]      Terri Piedra, [place] [facility] [phone] Pager  09/21/2018  11:50 AM

## 2018-09-21 NOTE — Progress Notes (Addendum)
Palliative Medicine RN Note: Case discussed with PMT Medical Director Dr Rhea Pink.  At this time, there is not a role for PMT to discuss Mark [name]. If Mark [name] reaches end of life/actively dying stage OR if he has both a provider to manage outpatient symptoms and has completed treatment for his addiction, then PMT will be able to engage with him.   If he reaches end of life during a hospital stay, please call our office at [phone]. If he has outpatient management and addiction treatment, he can be referred to community-based palliative care for [place] discussions.   PMT will sign off.  Marjie Skiff Andrea Ferrer, RN, BSN, [facility] Palliative Medicine Team 09/21/2018 12:22 PM Office [phone]

## 2018-09-22 ENCOUNTER — Inpatient Hospital Stay (HOSPITAL_COMMUNITY): Payer: Medicare Other

## 2018-09-22 DIAGNOSIS — I361 Nonrheumatic tricuspid (valve) insufficiency: Secondary | ICD-10-CM

## 2018-09-22 DIAGNOSIS — I340 Nonrheumatic mitral (valve) insufficiency: Secondary | ICD-10-CM

## 2018-09-22 DIAGNOSIS — G061 Intraspinal abscess and granuloma: Secondary | ICD-10-CM

## 2018-09-22 LAB — CBC WITH DIFFERENTIAL/PLATELET
Abs Immature Granulocytes: 0.16 10*3/uL — ABNORMAL HIGH (ref 0.00–0.07)
Basophils Absolute: 0 10*3/uL (ref 0.0–0.1)
Basophils Relative: 0 %
Eosinophils Absolute: 0.1 10*3/uL (ref 0.0–0.5)
Eosinophils Relative: 1 %
HCT: 24.3 % — ABNORMAL LOW (ref 39.0–52.0)
Hemoglobin: 8.2 g/dL — ABNORMAL LOW (ref 13.0–17.0)
Immature Granulocytes: 1 %
Lymphocytes Relative: 6 %
Lymphs Abs: 0.6 10*3/uL — ABNORMAL LOW (ref 0.7–4.0)
MCH: 31.3 pg (ref 26.0–34.0)
MCHC: 33.7 g/dL (ref 30.0–36.0)
MCV: 92.7 fL (ref 80.0–100.0)
Monocytes Absolute: 1 10*3/uL (ref 0.1–1.0)
Monocytes Relative: 9 %
Neutro Abs: 9.4 10*3/uL — ABNORMAL HIGH (ref 1.7–7.7)
Neutrophils Relative %: 83 %
Platelets: 56 10*3/uL — ABNORMAL LOW (ref 150–400)
RBC: 2.62 MIL/uL — ABNORMAL LOW (ref 4.22–5.81)
RDW: 17.5 % — ABNORMAL HIGH (ref 11.5–15.5)
WBC: 11.3 10*3/uL — ABNORMAL HIGH (ref 4.0–10.5)
nRBC: 0 % (ref 0.0–0.2)

## 2018-09-22 LAB — COMPREHENSIVE METABOLIC PANEL
ALT: 11 U/L (ref 0–44)
AST: 27 U/L (ref 15–41)
Albumin: 1.2 g/dL — ABNORMAL LOW (ref 3.5–5.0)
Alkaline Phosphatase: 103 U/L (ref 38–126)
Anion gap: 9 (ref 5–15)
BUN: 40 mg/dL — ABNORMAL HIGH (ref 8–23)
CO2: 18 mmol/L — ABNORMAL LOW (ref 22–32)
Calcium: 7.4 mg/dL — ABNORMAL LOW (ref 8.9–10.3)
Chloride: 106 mmol/L (ref 98–111)
Creatinine, Ser: 2.18 mg/dL — ABNORMAL HIGH (ref 0.61–1.24)
GFR calc Af Amer: 36 mL/min — ABNORMAL LOW (ref >60)
GFR calc non Af Amer: 31 mL/min — ABNORMAL LOW (ref >60)
Glucose, Bld: 93 mg/dL (ref 70–99)
Potassium: 3.9 mmol/L (ref 3.5–5.1)
Sodium: 133 mmol/L — ABNORMAL LOW (ref 135–145)
Total Bilirubin: 1 mg/dL (ref 0.3–1.2)
Total Protein: 4.9 g/dL — ABNORMAL LOW (ref 6.5–8.1)

## 2018-09-22 LAB — CULTURE, BLOOD (ROUTINE X 2): Special Requests: ADEQUATE

## 2018-09-22 LAB — GLUCOSE, CAPILLARY
Glucose-Capillary: 108 mg/dL — ABNORMAL HIGH (ref 70–99)
Glucose-Capillary: 82 mg/dL (ref 70–99)
Glucose-Capillary: 66 mg/dL — ABNORMAL LOW (ref 70–99)
Glucose-Capillary: 100 mg/dL — ABNORMAL HIGH (ref 70–99)
Glucose-Capillary: 81 mg/dL (ref 70–99)
Glucose-Capillary: 102 mg/dL — ABNORMAL HIGH (ref 70–99)

## 2018-09-22 LAB — CK: Total CK: 12 U/L — ABNORMAL LOW (ref 49–397)

## 2018-09-22 LAB — MAGNESIUM: Magnesium: 1.9 mg/dL (ref 1.7–2.4)

## 2018-09-22 NOTE — Progress Notes (Signed)
Patient ID: Mark [name], male   DOB: 08-06-1955, 64 y.o.   MRN: [id number]  PROGRESS NOTE    Arshdeep Bolger  CVE:[id number] DOB: Apr 28, 1956 DOA: 09/19/2018 PCP: [name], MD   Brief Narrative:  63 year old male with history of alcoholic liver cirrhosis, pancytopenia, hypertension, coronary artery disease, polysubstance abuse (alcohol, heroin, marijuana), CKD stage III and MSSA bacteremia in 04/2018 presented with worsening lower back pain.  COVID-19 rapid testing was negative on admission.  MRI of the lumbar spine revealed enhancement throughout the epidural space of L3 through the sacrum concerning for possible phlegmon/abscess, L5-S1 marrow enhancement concerning for septic arthritis, and spinal stenosis L3-S1. He was started on broad-spectrum antibiotics.  Neurosurgery was consulted.  Assessment & Plan:   Principal Problem:   MRSA bacteremia Active Problems:   Cirrhosis, alcoholic ([place])   Polysubstance abuse ([place])   Hyponatremia   Pancytopenia ([name])   Hepatic encephalopathy ([name])   Alcohol abuse   Septic arthritis ([name])   Anemia of chronic disease   Hypoalbuminemia   Hypoglycemia without diagnosis of diabetes mellitus   Epidural abscess   Septic arthritis/epidural abscess of lumbar spine -MRI of the lumbar spine revealed enhancement throughout the epidural space of L3 through the sacrum concerning for possible phlegmon/abscess, L5-S1 marrow enhancement concerning for septic arthritis, and spinal stenosis L3-S1 -Patient was initially started on broad-spectrum antibiotics vancomycin and Zosyn.  Patient has been subsequently switched to daptomycin per ID. -Neurosurgery has been consulted and had recommended IR consultation for probable drainage/biopsy.  As per IR, the abscess/collection was not large enough to aspirate or drain.  IR has notified Dr. Zada Finders. -Neurosurgery found patient to be high risk for any surgical intervention and is recommended conservative  management with IV antibiotics only. -No evidence of loss of bowel or bladder control for now -Continue pain management and supportive care. -Patient remains afebrile.  MRSA bacteremia -Susceptibilities pending.  ID has been consulted automatically.  Currently on daptomycin.   -Follow repeat cultures.   -2D echo: Preserved ejection fraction, no wall motion normalities, no vegetations appreciated on any valve.  Mild regurgitation and he tricuspid valve was appreciated by color flow Doppler..  Acute kidney injury with metabolic acidosis -Baseline creatinine of 0.95 on 08/22/2018.  Patient on Lasix and spironolactone at home which is on hold. -Presented with creatinine of 1.82.  Creatinine has worsened at 2.4 -Continue IV fluids -Follow renal function trend -Strict input and output. -Renal ultrasound, demonstrated no acute obstructive uropathy.  Alcoholic cirrhosis with hepatic encephalopathy -Continue lactulose. -Diuretics remains on hold. -Continue nadolol  Polysubstance abuse -Patient admits to alcohol and recent heroin use prior to arrival -Continue CIWA protocol.  Social worker consult.  Continue multivitamin, thiamine and folate. -No active withdrawal currently. -Be careful with use of sedatives because of history of alcoholic cirrhosis and encephalopathy.  Hyponatremia -Sodium 130 on admission. With a chronic component given cirrhosis.  -Continue gentle fluid resuscitation.  Hypoalbuminemia -Follow recommendations by nutritional service -We will use feeding supplements.  Generalized deconditioning -Overall prognosis is very poor.  Patient is noncompliant and actively using drugs.   -Will discuss regarding inpatient supervise therapy as he will require IV antibiotics for the next 6 to 8 weeks minimum.  DVT prophylaxis: SCDs  Code Status: Full Family Communication: None at bedside Disposition Plan: Patient not a candidate for IV antibiotic therapy given prior history of  substance abuse.  Will require 6 to 8 weeks of IV antibiotics minimum.  Consultants:  Neurosurgery ID consulted automatically because  of positive blood cultures  Procedures:  See below for x-ray reports 2D echo: IMPRESSIONS 1. The left ventricle has normal systolic function with an ejection fraction of 60-65%. The cavity size was normal. There is mildly increased left ventricular wall thickness. Left ventricular diastolic parameters were normal.  2. The right ventricle has normal systolic function. The cavity was normal. There is no increase in right ventricular wall thickness.  3. Left atrial size was severely dilated.  4. No evidence of mitral valve stenosis.  5. Mild thickening of the aortic valve. Mild calcification of the aortic valve. No stenosis of the aortic valve.  Antimicrobials: 1 dose of vancomycin Zosyn and Ancef on 09/20/2018 -Daptomycin from 09/21/2018 onwards   Subjective: Afebrile, no chest pain, no nausea, no vomiting.  Complaining of back pain.  Overnight with hypoglycemic event and a slight increase in lethargy.  Objective: Vitals:   09/22/18 0014 09/22/18 0425 09/22/18 0820 09/22/18 1426  BP: 132/72 119/70 111/62 116/68  Pulse: (!) 103 (!) 105 93 91  Resp:  18 19 19   Temp: 98.2 F (36.8 C) 98.2 F (36.8 C) 98.6 F (37 C) 98.8 F (37.1 C)  TempSrc:  Oral Oral Oral  SpO2: 96% 96% 97%   Weight:      Height:        Intake/Output Summary (Last 24 hours) at 09/22/2018 1820 Last data filed at 09/22/2018 1320 Gross per 24 hour  Intake 240 ml  Output 2 ml  Net 238 ml   Filed Weights   09/19/18 2011 09/20/18 1055  Weight: 61.2 kg 64.7 kg    Examination: General exam: Alert, awake, oriented x 3; slightly somnolent after hypoglycemia earlier.  Reports poor appetite.  No chest pain, no shortness of breath.  No nausea or vomiting.  Feeling weak and complaining of back pain. Respiratory system: Clear to auscultation. Respiratory effort normal. Cardiovascular  system:RRR. No murmurs, rubs, gallops. Gastrointestinal system: Abdomen is nondistended, soft and nontender. No organomegaly or masses felt. Normal bowel sounds heard. Central nervous system: Alert and oriented. No focal neurological deficits. Extremities: No C/C/E, +pedal pulses; moving 4 limbs without any difficulties. Skin: No rashes, no petechiae. Psychiatry: Flat affect; no suicidal ideation or hallucinations.   Data Reviewed: I have personally reviewed following labs and imaging studies  CBC: Recent Labs  Lab 09/20/18 0726 09/21/18 0546 09/22/18 0427  WBC 4.9 7.9 11.3*  NEUTROABS 4.1  --  9.4*  HGB 9.1* 8.2* 8.2*  HCT 28.2* 24.3* 24.3*  MCV 98.3 94.6 92.7  PLT PLATELETS APPEAR DECREASED 53* 56*   Basic Metabolic Panel: Recent Labs  Lab 09/20/18 0726 09/21/18 0546 09/22/18 0427  NA 130* 129* 133*  K 4.2 4.1 3.9  CL 101 103 106  CO2 18* 16* 18*  GLUCOSE 56* 99 93  BUN 26* 37* 40*  CREATININE 1.82* 2.40* 2.18*  CALCIUM 8.1* 7.4* 7.4*  MG  --   --  1.9   GFR: Estimated Creatinine Clearance: 29.4 mL/min (A) (by C-G formula based on SCr of 2.18 mg/dL (H)).   Liver Function Tests: Recent Labs  Lab 09/20/18 0726 09/21/18 0546 09/22/18 0427  AST 34 30 27  ALT 15 12 11   ALKPHOS 120 94 103  BILITOT 1.8* 1.6* 1.0  PROT 6.0* 5.2* 4.9*  ALBUMIN 1.7* 1.3* 1.2*    Recent Labs  Lab 09/20/18 0930 09/21/18 0546  AMMONIA 111* 114*   Cardiac Enzymes: Recent Labs  Lab 09/22/18 0427  CKTOTAL 12*   CBG: Recent Labs  Lab 09/22/18 0427 09/22/18 0819 09/22/18 1006 09/22/18 1229 09/22/18 1647  GLUCAP 82 66* 100* 81 102*   Sepsis Labs: Recent Labs  Lab 09/20/18 0727 09/20/18 0905 09/20/18 1157  LATICACIDVEN 2.1* 2.8* 2.6*    Recent Results (from the past 240 hour(s))  Culture, blood (routine x 2)     Status: Abnormal   Collection Time: 09/20/18  7:25 AM  Result Value Ref Range Status   Specimen Description BLOOD RIGHT ANTECUBITAL  Final   Special  Requests   Final    BOTTLES DRAWN AEROBIC AND ANAEROBIC Blood Culture results may not be optimal due to an excessive volume of blood received in culture bottles   Culture  Setup Time   Final    IN BOTH AEROBIC AND ANAEROBIC BOTTLES GRAM POSITIVE COCCI CRITICAL RESULT CALLED TO, READ BACK BY AND VERIFIED WITH: Salli Real [id number] 09/21/2018 Mena Goes Performed at [facility], [place] [address] [place], [place] [address]    Culture METHICILLIN RESISTANT STAPHYLOCOCCUS AUREUS (A)  Final   Report Status 09/22/2018 FINAL  Final   Organism ID, Bacteria METHICILLIN RESISTANT STAPHYLOCOCCUS AUREUS  Final      Susceptibility   Methicillin resistant staphylococcus aureus - MIC*    CIPROFLOXACIN <=0.5 SENSITIVE Sensitive     ERYTHROMYCIN >=8 RESISTANT Resistant     GENTAMICIN <=0.5 SENSITIVE Sensitive     OXACILLIN RESISTANT Resistant     TETRACYCLINE <=1 SENSITIVE Sensitive     VANCOMYCIN 1 SENSITIVE Sensitive     TRIMETH/SULFA <=10 SENSITIVE Sensitive     CLINDAMYCIN <=0.25 SENSITIVE Sensitive     RIFAMPIN <=0.5 SENSITIVE Sensitive     Inducible Clindamycin NEGATIVE Sensitive     * METHICILLIN RESISTANT STAPHYLOCOCCUS AUREUS  Blood Culture ID Panel (Reflexed)     Status: Abnormal   Collection Time: 09/20/18  7:25 AM  Result Value Ref Range Status   Enterococcus species NOT DETECTED NOT DETECTED Final   Listeria monocytogenes NOT DETECTED NOT DETECTED Final   Staphylococcus species DETECTED (A) NOT DETECTED Final    Comment: CRITICAL RESULT CALLED TO, READ BACK BY AND VERIFIED WITH: Nadean Corwin [id number] 09/21/2018 T. TYSOR    Staphylococcus aureus (BCID) DETECTED (A) NOT DETECTED Final    Comment: Methicillin (oxacillin)-resistant Staphylococcus aureus (MRSA). MRSA is predictably resistant to beta-lactam antibiotics (except ceftaroline). Preferred therapy is vancomycin unless clinically contraindicated. Patient requires contact precautions if  hospitalized. CRITICAL RESULT CALLED TO,  READ BACK BY AND VERIFIED WITH: G. ABBOTT,PHARMD 0226 09/21/2018 T. TYSOR    Methicillin resistance DETECTED (A) NOT DETECTED Final    Comment: CRITICAL RESULT CALLED TO, READ BACK BY AND VERIFIED WITH: G. ABBOTT,PHARMD 0226 09/21/2018 T. TYSOR    Streptococcus species NOT DETECTED NOT DETECTED Final   Streptococcus agalactiae NOT DETECTED NOT DETECTED Final   Streptococcus pneumoniae NOT DETECTED NOT DETECTED Final   Streptococcus pyogenes NOT DETECTED NOT DETECTED Final   Acinetobacter baumannii NOT DETECTED NOT DETECTED Final   Enterobacteriaceae species NOT DETECTED NOT DETECTED Final   Enterobacter cloacae complex NOT DETECTED NOT DETECTED Final   Escherichia coli NOT DETECTED NOT DETECTED Final   Klebsiella oxytoca NOT DETECTED NOT DETECTED Final   Klebsiella pneumoniae NOT DETECTED NOT DETECTED Final   Proteus species NOT DETECTED NOT DETECTED Final   Serratia marcescens NOT DETECTED NOT DETECTED Final   Haemophilus influenzae NOT DETECTED NOT DETECTED Final   Neisseria meningitidis NOT DETECTED NOT DETECTED Final   Pseudomonas aeruginosa NOT DETECTED NOT DETECTED Final   Candida  albicans NOT DETECTED NOT DETECTED Final   Candida glabrata NOT DETECTED NOT DETECTED Final   Candida krusei NOT DETECTED NOT DETECTED Final   Candida parapsilosis NOT DETECTED NOT DETECTED Final   Candida tropicalis NOT DETECTED NOT DETECTED Final    Comment: Performed at [facility], [place] [address], [place], [place] [address]  Culture, blood (routine x 2)     Status: Abnormal   Collection Time: 09/20/18  7:34 AM  Result Value Ref Range Status   Specimen Description BLOOD LEFT ANTECUBITAL  Final   Special Requests   Final    BOTTLES DRAWN AEROBIC AND ANAEROBIC Blood Culture adequate volume   Culture  Setup Time   Final    IN BOTH AEROBIC AND ANAEROBIC BOTTLES GRAM POSITIVE COCCI CRITICAL RESULT CALLED TO, READ BACK BY AND VERIFIED WITH: G. ABBOTT,PHARMD 0226 09/21/2018 T. TYSOR     Culture (A)  Final    STAPHYLOCOCCUS AUREUS SUSCEPTIBILITIES PERFORMED ON PREVIOUS CULTURE WITHIN THE LAST 5 DAYS. Performed at [facility], [place] [address], [place], [place] [address]    Report Status 09/22/2018 FINAL  Final  SARS Coronavirus 2 (CEPHEID - Performed in [place] hospital lab), Hosp Order     Status: None   Collection Time: 09/20/18  7:34 AM  Result Value Ref Range Status   SARS Coronavirus 2 NEGATIVE NEGATIVE Final    Comment: (NOTE) If result is NEGATIVE SARS-CoV-2 target nucleic acids are NOT DETECTED. The SARS-CoV-2 RNA is generally detectable in upper and lower  respiratory specimens during the acute phase of infection. The lowest  concentration of SARS-CoV-2 viral copies this assay can detect is 250  copies / mL. A negative result does not preclude SARS-CoV-2 infection  and should not be used as the sole basis for treatment or other  patient management decisions.  A negative result may occur with  improper specimen collection / handling, submission of specimen other  than nasopharyngeal swab, presence of viral mutation(s) within the  areas targeted by this assay, and inadequate number of viral copies  (<250 copies / mL). A negative result must be combined with clinical  observations, patient history, and epidemiological information. If result is POSITIVE SARS-CoV-2 target nucleic acids are DETECTED. The SARS-CoV-2 RNA is generally detectable in upper and lower  respiratory specimens dur ing the acute phase of infection.  Positive  results are indicative of active infection with SARS-CoV-2.  Clinical  correlation with patient history and other diagnostic information is  necessary to determine patient infection status.  Positive results do  not rule out bacterial infection or co-infection with other viruses. If result is PRESUMPTIVE POSTIVE SARS-CoV-2 nucleic acids MAY BE PRESENT.   A presumptive positive result was obtained on the submitted specimen    and confirmed on repeat testing.  While 2019 novel coronavirus  (SARS-CoV-2) nucleic acids may be present in the submitted sample  additional confirmatory testing may be necessary for epidemiological  and / or clinical management purposes  to differentiate between  SARS-CoV-2 and other Sarbecovirus currently known to infect humans.  If clinically indicated additional testing with an alternate test  methodology [phone]) is advised. The SARS-CoV-2 RNA is generally  detectable in upper and lower respiratory sp ecimens during the acute  phase of infection. The expected result is Negative. Fact Sheet for Patients:  StrictlyIdeas.no Fact Sheet for Healthcare Providers: BankingDealers.co.za This test is not yet approved or cleared by the [place] FDA and has been authorized for detection and/or  diagnosis of SARS-CoV-2 by FDA under an Emergency Use Authorization (EUA).  This EUA will remain in effect (meaning this test can be used) for the duration of the COVID-19 declaration under Section 564(b)(1) of the Act, 21 U.S.C. section 360bbb-3(b)(1), unless the authorization is terminated or revoked sooner. Performed at [facility], [place] [address], [place], [place] [address]   Culture, blood (routine x 2)     Status: None (Preliminary result)   Collection Time: 09/21/18 12:23 PM  Result Value Ref Range Status   Specimen Description BLOOD LEFT ANTECUBITAL  Final   Special Requests   Final    BOTTLES DRAWN AEROBIC ONLY Blood Culture results may not be optimal due to an inadequate volume of blood received in culture bottles   Culture   Final    NO GROWTH < 24 HOURS Performed at [facility], [place] [address] [place], [place] [address]    Report Status PENDING  Incomplete  Culture, blood (routine x 2)     Status: None (Preliminary result)   Collection Time: 09/21/18  3:45 PM  Result Value Ref Range Status   Specimen Description  BLOOD RIGHT HAND  Final   Special Requests   Final    BOTTLES DRAWN AEROBIC ONLY Blood Culture results may not be optimal due to an inadequate volume of blood received in culture bottles   Culture   Final    NO GROWTH < 24 HOURS Performed at [facility], [place] [address] [place], [place] [address]    Report Status PENDING  Incomplete     Radiology Studies: US Renal  Result Date: 09/21/2018 CLINICAL DATA:  Acute renal failure EXAM: RENAL / URINARY TRACT ULTRASOUND COMPLETE COMPARISON:  None. FINDINGS: Right Kidney: Renal measurements: 10.7 x 4.4 x 4.6 cm = volume: 113 mL . Echogenicity within normal limits. No mass or hydronephrosis visualized. Left Kidney: Renal measurements: 10.2 x 4.3 x 4.3 cm = volume: 98 mL. Echogenicity within normal limits. No mass or hydronephrosis visualized. There is a cyst at the lower pole measuring 4.3 x 2.8 x 2.9 cm. Bladder: Appears normal for degree of bladder distention. Other: Small volume abdominal ascites and incompletely visualized bilateral pleural effusions. IMPRESSION: 1. Normal appearance of the kidneys. 2. Bilateral pleural effusions and small volume abdominal ascites. Electronically Signed   By: Ulyses Jarred M.D.   On: 09/21/2018 17:05    Scheduled Meds:  feeding supplement (ENSURE ENLIVE)  237 mL Oral TID BM   folic acid  1 mg Oral Daily   lactulose  30 g Oral BID   LORazepam  0-4 mg Intravenous Q12H   multivitamin with minerals  1 tablet Oral Daily   nadolol  20 mg Oral Daily   thiamine  100 mg Oral Daily   Continuous Infusions:  sodium chloride 125 mL/hr at 09/22/18 0540   DAPTOmycin (CUBICIN)  IV Stopped (09/21/18 1124)     LOS: 2 days    Barton Dubois, MD [phone]  [facility] 09/22/2018, 6:20 PM

## 2018-09-22 NOTE — Progress Notes (Signed)
  Echocardiogram 2D Echocardiogram has been performed.  Orpheus [place] G Lynx Goodrich 09/22/2018, 2:07 PM

## 2018-09-22 NOTE — Progress Notes (Signed)
Hypoglycemic Event  CBG: 66  Treatment: 4 oz juice/soda  Symptoms: Pale  Follow-up CBG: Time: 1006 CBG Result:100  Possible Reasons for Event: Inadequate meal intake  Comments/MD notified:bedside notified Dr. Dyann Kief.    Mark [name]

## 2018-09-22 NOTE — Progress Notes (Signed)
[facility]  Date of Admission:  09/19/2018     Total days of antibiotics 2         ASSESSMENT/PLAN  Mark [name] is a 63 y/o male with multiple medical problems including alcoholic cirrhosis, CKD III, and recent admission in January 2020 with MSSA bacteremia treated for 2 weeks with cefazolin who now has a lumbar epidural abscess and septic arthritis of L3-L4 facet joints complicated with MRSA bacteremia. Neurosurgery follow up with recommendations for medical intervention given high risk for surgery.   MRSA Bacteremia -  Repeat blood cultures with no growth in <24 hours. Awaiting TTE to rule out endocarditis. Source appears to be lumbar abscess. Continue current Daptomycin and monitor cultures for clearance of bacteremia.    Lumbar Epidural Abscess and Facet Septic Arthritis - Continue medical management given Mark [name] multiple medical problems making him a high risk for surgery. Disposition remains a challenge as previous [facility] had concerns regarding Mark [name] admitting prior IV drug use prior to his previous admission. Given his need for Daptomycin it is likely skilled nursing facility placement would be challenging. Options at present include remaining in hospital for treatment or consideration of alternative regimen with possible long acting antibiotic and oral therapy. Will continue to monitor and develop plan.   Chronic Kidney Disease, Stage 3 - Renal function stable today. Continue with Daptomycin to avoid nephrotoxicity that can occur with Vancomycin.  Therapeutic Drug Monitoring -  CK level within normal ranges. Continue to monitor while on Daptomycin.   Principal Problem:   MRSA bacteremia Active Problems:   Septic arthritis ([place])   Cirrhosis, alcoholic ([place])   Polysubstance abuse ([place])   Hyponatremia   Pancytopenia ([name])   Hepatic encephalopathy ([name])   Alcohol abuse   Anemia of chronic disease   Hypoalbuminemia   Hypoglycemia  without diagnosis of diabetes mellitus   Epidural abscess   . feeding supplement (ENSURE ENLIVE)  237 mL Oral TID BM  . folic acid  1 mg Oral Daily  . lactulose  30 g Oral BID  . LORazepam  0-4 mg Intravenous Q6H   Followed by  . LORazepam  0-4 mg Intravenous Q12H  . multivitamin with minerals  1 tablet Oral Daily  . nadolol  20 mg Oral Daily  . thiamine  100 mg Oral Daily    SUBJECTIVE:  Afebrile overnight. Continues to have pain located in his back. Per nursing he has had 1/2 of the dose of his pain medication as he has been drowsy at times.   No Known Allergies   Review of Systems: Review of Systems  Constitutional: Negative for chills, fever and weight loss.  Respiratory: Negative for cough, shortness of breath and wheezing.   Cardiovascular: Negative for chest pain and leg swelling.  Gastrointestinal: Negative for abdominal pain, constipation, diarrhea, nausea and vomiting.  Musculoskeletal: Positive for back pain.  Skin: Negative for rash.      OBJECTIVE: Vitals:   09/21/18 2210 09/22/18 0014 09/22/18 0425 09/22/18 0820  BP: 127/70 132/72 119/70 111/62  Pulse: (!) 102 (!) 103 (!) 105 93  Resp:   18 19  Temp: 98.6 F (37 C) 98.2 F (36.8 C) 98.2 F (36.8 C) 98.6 F (37 C)  TempSrc:   Oral Oral  SpO2: 100% 96% 96% 97%  Weight:      Height:       Body mass index is 23.74 kg/m.  Physical Exam Constitutional:  General: He is not in acute distress.    Appearance: He is well-developed. He is ill-appearing.     Comments: Lying in bed; lethargic and requires verbal/tactile stimulation to continue conversation.   Cardiovascular:     Rate and Rhythm: Normal rate and regular rhythm.     Heart sounds: Normal heart sounds.  Pulmonary:     Effort: Pulmonary effort is normal.     Breath sounds: Normal breath sounds.  Skin:    General: Skin is warm and dry.  Neurological:     Mental Status: He is oriented to person, place, and time. He is lethargic.   Psychiatric:        Behavior: Behavior normal.        Thought Content: Thought content normal.        Judgment: Judgment normal.     Lab Results Lab Results  Component Value Date   WBC 11.3 (H) 09/22/2018   HGB 8.2 (L) 09/22/2018   HCT 24.3 (L) 09/22/2018   MCV 92.7 09/22/2018   PLT 56 (L) 09/22/2018    Lab Results  Component Value Date   CREATININE 2.18 (H) 09/22/2018   BUN 40 (H) 09/22/2018   NA 133 (L) 09/22/2018   K 3.9 09/22/2018   CL 106 09/22/2018   CO2 18 (L) 09/22/2018    Lab Results  Component Value Date   ALT 11 09/22/2018   AST 27 09/22/2018   ALKPHOS 103 09/22/2018   BILITOT 1.0 09/22/2018     Microbiology: Recent Results (from the past 240 hour(s))  Culture, blood (routine x 2)     Status: Abnormal   Collection Time: 09/20/18  7:25 AM  Result Value Ref Range Status   Specimen Description BLOOD RIGHT ANTECUBITAL  Final   Special Requests   Final    BOTTLES DRAWN AEROBIC AND ANAEROBIC Blood Culture results may not be optimal due to an excessive volume of blood received in culture bottles   Culture  Setup Time   Final    IN BOTH AEROBIC AND ANAEROBIC BOTTLES GRAM POSITIVE COCCI CRITICAL RESULT CALLED TO, READ BACK BY AND VERIFIED WITH: Salli Real [id number] 09/21/2018 Mena Goes Performed at [facility], [place] [address] [place], [place] [address]    Culture METHICILLIN RESISTANT STAPHYLOCOCCUS AUREUS (A)  Final   Report Status 09/22/2018 FINAL  Final   Organism ID, Bacteria METHICILLIN RESISTANT STAPHYLOCOCCUS AUREUS  Final      Susceptibility   Methicillin resistant staphylococcus aureus - MIC*    CIPROFLOXACIN <=0.5 SENSITIVE Sensitive     ERYTHROMYCIN >=8 RESISTANT Resistant     GENTAMICIN <=0.5 SENSITIVE Sensitive     OXACILLIN RESISTANT Resistant     TETRACYCLINE <=1 SENSITIVE Sensitive     VANCOMYCIN 1 SENSITIVE Sensitive     TRIMETH/SULFA <=10 SENSITIVE Sensitive     CLINDAMYCIN <=0.25 SENSITIVE Sensitive     RIFAMPIN <=0.5 SENSITIVE  Sensitive     Inducible Clindamycin NEGATIVE Sensitive     * METHICILLIN RESISTANT STAPHYLOCOCCUS AUREUS  Blood Culture ID Panel (Reflexed)     Status: Abnormal   Collection Time: 09/20/18  7:25 AM  Result Value Ref Range Status   Enterococcus species NOT DETECTED NOT DETECTED Final   Listeria monocytogenes NOT DETECTED NOT DETECTED Final   Staphylococcus species DETECTED (A) NOT DETECTED Final    Comment: CRITICAL RESULT CALLED TO, READ BACK BY AND VERIFIED WITH: Nadean Corwin 2130 09/21/2018 T. TYSOR    Staphylococcus aureus (BCID) DETECTED (A) NOT DETECTED  Final    Comment: Methicillin (oxacillin)-resistant Staphylococcus aureus (MRSA). MRSA is predictably resistant to beta-lactam antibiotics (except ceftaroline). Preferred therapy is vancomycin unless clinically contraindicated. Patient requires contact precautions if  hospitalized. CRITICAL RESULT CALLED TO, READ BACK BY AND VERIFIED WITH: G. ABBOTT,PHARMD 0226 09/21/2018 T. TYSOR    Methicillin resistance DETECTED (A) NOT DETECTED Final    Comment: CRITICAL RESULT CALLED TO, READ BACK BY AND VERIFIED WITH: G. ABBOTT,PHARMD 0226 09/21/2018 T. TYSOR    Streptococcus species NOT DETECTED NOT DETECTED Final   Streptococcus agalactiae NOT DETECTED NOT DETECTED Final   Streptococcus pneumoniae NOT DETECTED NOT DETECTED Final   Streptococcus pyogenes NOT DETECTED NOT DETECTED Final   Acinetobacter baumannii NOT DETECTED NOT DETECTED Final   Enterobacteriaceae species NOT DETECTED NOT DETECTED Final   Enterobacter cloacae complex NOT DETECTED NOT DETECTED Final   Escherichia coli NOT DETECTED NOT DETECTED Final   Klebsiella oxytoca NOT DETECTED NOT DETECTED Final   Klebsiella pneumoniae NOT DETECTED NOT DETECTED Final   Proteus species NOT DETECTED NOT DETECTED Final   Serratia marcescens NOT DETECTED NOT DETECTED Final   Haemophilus influenzae NOT DETECTED NOT DETECTED Final   Neisseria meningitidis NOT DETECTED NOT DETECTED Final    Pseudomonas aeruginosa NOT DETECTED NOT DETECTED Final   Candida albicans NOT DETECTED NOT DETECTED Final   Candida glabrata NOT DETECTED NOT DETECTED Final   Candida krusei NOT DETECTED NOT DETECTED Final   Candida parapsilosis NOT DETECTED NOT DETECTED Final   Candida tropicalis NOT DETECTED NOT DETECTED Final    Comment: Performed at [facility], [place]. [address] [place], [place] [address]  Culture, blood (routine x 2)     Status: Abnormal   Collection Time: 09/20/18  7:34 AM  Result Value Ref Range Status   Specimen Description BLOOD LEFT ANTECUBITAL  Final   Special Requests   Final    BOTTLES DRAWN AEROBIC AND ANAEROBIC Blood Culture adequate volume   Culture  Setup Time   Final    IN BOTH AEROBIC AND ANAEROBIC BOTTLES GRAM POSITIVE COCCI CRITICAL RESULT CALLED TO, READ BACK BY AND VERIFIED WITH: G. ABBOTT,PHARMD 0226 09/21/2018 T. TYSOR    Culture (A)  Final    STAPHYLOCOCCUS AUREUS SUSCEPTIBILITIES PERFORMED ON PREVIOUS CULTURE WITHIN THE LAST 5 DAYS. Performed at [facility], [place] [address] [place], [place] [address]    Report Status 09/22/2018 FINAL  Final  SARS Coronavirus 2 (CEPHEID - Performed in [place] hospital lab), Hosp Order     Status: None   Collection Time: 09/20/18  7:34 AM  Result Value Ref Range Status   SARS Coronavirus 2 NEGATIVE NEGATIVE Final    Comment: (NOTE) If result is NEGATIVE SARS-CoV-2 target nucleic acids are NOT DETECTED. The SARS-CoV-2 RNA is generally detectable in upper and lower  respiratory specimens during the acute phase of infection. The lowest  concentration of SARS-CoV-2 viral copies this assay can detect is 250  copies / mL. A negative result does not preclude SARS-CoV-2 infection  and should not be used as the sole basis for treatment or other  patient management decisions.  A negative result may occur with  improper specimen collection / handling, submission of specimen other  than nasopharyngeal swab,  presence of viral mutation(s) within the  areas targeted by this assay, and inadequate number of viral copies  (<250 copies / mL). A negative result must be combined with clinical  observations, patient history, and epidemiological information. If result is POSITIVE SARS-CoV-2  target nucleic acids are DETECTED. The SARS-CoV-2 RNA is generally detectable in upper and lower  respiratory specimens dur ing the acute phase of infection.  Positive  results are indicative of active infection with SARS-CoV-2.  Clinical  correlation with patient history and other diagnostic information is  necessary to determine patient infection status.  Positive results do  not rule out bacterial infection or co-infection with other viruses. If result is PRESUMPTIVE POSTIVE SARS-CoV-2 nucleic acids MAY BE PRESENT.   A presumptive positive result was obtained on the submitted specimen  and confirmed on repeat testing.  While 2019 novel coronavirus  (SARS-CoV-2) nucleic acids may be present in the submitted sample  additional confirmatory testing may be necessary for epidemiological  and / or clinical management purposes  to differentiate between  SARS-CoV-2 and other Sarbecovirus currently known to infect humans.  If clinically indicated additional testing with an alternate test  methodology [id number]) is advised. The SARS-CoV-2 RNA is generally  detectable in upper and lower respiratory sp ecimens during the acute  phase of infection. The expected result is Negative. Fact Sheet for Patients:  StrictlyIdeas.no Fact Sheet for Healthcare Providers: BankingDealers.co.za This test is not yet approved or cleared by the [place] FDA and has been authorized for detection and/or diagnosis of SARS-CoV-2 by FDA under an Emergency Use Authorization (EUA).  This EUA will remain in effect (meaning this test can be used) for the duration of the COVID-19 declaration under  Section 564(b)(1) of the Act, 21 U.S.C. section 360bbb-3(b)(1), unless the authorization is terminated or revoked sooner. Performed at [facility], [place] [address] [place],  [place] [address]   Culture, blood (routine x 2)     Status: None (Preliminary result)   Collection Time: 09/21/18 12:23 PM  Result Value Ref Range Status   Specimen Description BLOOD LEFT ANTECUBITAL  Final   Special Requests   Final    BOTTLES DRAWN AEROBIC ONLY Blood Culture results may not be optimal due to an inadequate volume of blood received in culture bottles   Culture   Final    NO GROWTH < 24 HOURS Performed at [facility], [place] [address], [name], [place] [address]    Report Status PENDING  Incomplete  Culture, blood (routine x 2)     Status: None (Preliminary result)   Collection Time: 09/21/18  3:45 PM  Result Value Ref Range Status   Specimen Description BLOOD RIGHT HAND  Final   Special Requests   Final    BOTTLES DRAWN AEROBIC ONLY Blood Culture results may not be optimal due to an inadequate volume of blood received in culture bottles   Culture   Final    NO GROWTH < 24 HOURS Performed at [facility], [place] [address] [place],  [address]    Report Status PENDING  Incomplete     Terri Piedra, NP [place] [facility] [place] Pager  09/22/2018  10:39 AM

## 2018-09-23 ENCOUNTER — Inpatient Hospital Stay (HOSPITAL_COMMUNITY): Payer: Medicare Other

## 2018-09-23 LAB — AMMONIA: Ammonia: 11 umol/L (ref 9–35)

## 2018-09-23 LAB — GLUCOSE, CAPILLARY
Glucose-Capillary: 61 mg/dL — ABNORMAL LOW (ref 70–99)
Glucose-Capillary: 143 mg/dL — ABNORMAL HIGH (ref 70–99)
Glucose-Capillary: 74 mg/dL (ref 70–99)
Glucose-Capillary: 82 mg/dL (ref 70–99)
Glucose-Capillary: 74 mg/dL (ref 70–99)
Glucose-Capillary: 76 mg/dL (ref 70–99)
Glucose-Capillary: 72 mg/dL (ref 70–99)
Glucose-Capillary: 59 mg/dL — ABNORMAL LOW (ref 70–99)
Glucose-Capillary: 121 mg/dL — ABNORMAL HIGH (ref 70–99)
Glucose-Capillary: 90 mg/dL (ref 70–99)

## 2018-09-23 LAB — COMPREHENSIVE METABOLIC PANEL
ALT: 10 U/L (ref 0–44)
AST: 27 U/L (ref 15–41)
Albumin: 1.4 g/dL — ABNORMAL LOW (ref 3.5–5.0)
Alkaline Phosphatase: 134 U/L — ABNORMAL HIGH (ref 38–126)
Anion gap: 7 (ref 5–15)
BUN: 36 mg/dL — ABNORMAL HIGH (ref 8–23)
CO2: 16 mmol/L — ABNORMAL LOW (ref 22–32)
Calcium: 7.6 mg/dL — ABNORMAL LOW (ref 8.9–10.3)
Chloride: 115 mmol/L — ABNORMAL HIGH (ref 98–111)
Creatinine, Ser: 1.52 mg/dL — ABNORMAL HIGH (ref 0.61–1.24)
GFR calc Af Amer: 55 mL/min — ABNORMAL LOW (ref >60)
GFR calc non Af Amer: 47 mL/min — ABNORMAL LOW (ref >60)
Glucose, Bld: 90 mg/dL (ref 70–99)
Potassium: 3.8 mmol/L (ref 3.5–5.1)
Sodium: 138 mmol/L (ref 135–145)
Total Bilirubin: 1.3 mg/dL — ABNORMAL HIGH (ref 0.3–1.2)
Total Protein: 5.3 g/dL — ABNORMAL LOW (ref 6.5–8.1)

## 2018-09-23 LAB — BLOOD GAS, ARTERIAL
Acid-base deficit: 7.2 mmol/L — ABNORMAL HIGH (ref 0.0–2.0)
Bicarbonate: 16 mmol/L — ABNORMAL LOW (ref 20.0–28.0)
Drawn by: 448981
FIO2: 21
O2 Saturation: 91.2 %
Patient temperature: 98.8
pCO2 arterial: 23.4 mmHg — ABNORMAL LOW (ref 32.0–48.0)
pH, Arterial: 7.451 — ABNORMAL HIGH (ref 7.350–7.450)
pO2, Arterial: 63.4 mmHg — ABNORMAL LOW (ref 83.0–108.0)

## 2018-09-23 MED ORDER — LORAZEPAM 2 MG/ML IJ SOLN
1.0000 mg | INTRAMUSCULAR | Status: DC | PRN
  Administered 2018-09-23: 1 mg via INTRAVENOUS
  Filled 2018-09-23: qty 1

## 2018-09-23 MED ORDER — LORAZEPAM 2 MG/ML IJ SOLN
INTRAMUSCULAR | Status: AC
  Filled 2018-09-23: qty 2

## 2018-09-23 MED ORDER — PIPERACILLIN-TAZOBACTAM 3.375 G IVPB
3.3750 g | Freq: Three times a day (TID) | INTRAVENOUS | Status: DC
  Administered 2018-09-24: 3.375 g via INTRAVENOUS
  Filled 2018-09-23: qty 50

## 2018-09-23 MED ORDER — SODIUM CHLORIDE 0.9 % IV SOLN
500.0000 mg | Freq: Every day | INTRAVENOUS | Status: DC
  Administered 2018-09-24: 500 mg via INTRAVENOUS
  Filled 2018-09-23 (×4): qty 10

## 2018-09-23 MED ORDER — OXYCODONE HCL 5 MG PO TABS: 5.0000 mg | ORAL_TABLET | Freq: Four times a day (QID) | ORAL | Status: DC | PRN

## 2018-09-23 MED ORDER — DEXTROSE-NACL 5-0.9 % IV SOLN
INTRAVENOUS | Status: DC
  Administered 2018-09-23: 23:00:00 via INTRAVENOUS
  Administered 2018-09-24: 1000 mL via INTRAVENOUS
  Administered 2018-09-25 – 2018-09-28 (×3): via INTRAVENOUS

## 2018-09-23 MED ORDER — LORAZEPAM 2 MG/ML IJ SOLN
0.0000 mg | INTRAMUSCULAR | Status: DC
  Administered 2018-09-23: 4 mg via INTRAVENOUS

## 2018-09-23 MED ORDER — PIPERACILLIN-TAZOBACTAM 3.375 G IVPB 30 MIN
3.3750 g | Freq: Once | INTRAVENOUS | Status: AC
  Administered 2018-09-24: 3.375 g via INTRAVENOUS
  Filled 2018-09-23 (×2): qty 50

## 2018-09-23 MED ORDER — NALOXONE HCL 0.4 MG/ML IJ SOLN
0.2000 mg | INTRAMUSCULAR | Status: DC | PRN
  Administered 2018-09-23: 0.2 mg via INTRAVENOUS
  Filled 2018-09-23: qty 1

## 2018-09-23 MED ORDER — LACTULOSE ENEMA
300.0000 mL | Freq: Once | ORAL | Status: AC
  Administered 2018-09-23: 300 mL via RECTAL
  Filled 2018-09-23: qty 300

## 2018-09-23 MED ORDER — LACTULOSE 10 GM/15ML PO SOLN
30.0000 g | Freq: Three times a day (TID) | ORAL | Status: DC
  Administered 2018-09-23: 15 g via ORAL
  Administered 2018-09-25 – 2018-09-28 (×8): 30 g via ORAL
  Filled 2018-09-23 (×15): qty 45

## 2018-09-23 NOTE — Progress Notes (Signed)
[facility]  Date of Admission:  09/19/2018     Total days of antibiotics 3         ASSESSMENT/PLAN  Mark [name] is a 63 y/o male with multiple medical problems including alcoholic cirrhosis, CKD III, and recent admission in January 2020 with MSSA bacteremia treated for 2 weeks with cefazolin who now has a lumbar epidural abscess and septic arthritis of L3-L4 facet joints complicated with MRSA bacteremia. Neurosurgery follow up with recommendations for medical intervention given high risk for surgery. TTE negative for endocarditis.  MRSA bacteremia - Repeat blood cultures remain without growth to date. TTE negative for endocarditis. Continue to monitor cultures to ensure clearance of bacteremia.   Lumbar epidural abscess and facet septic arthritis - Continuing with medical management with Daptomycin. Given overnight events and previous note review, Mark [name] does not appear to be a candidate for home health IV therapy and PICC line. Ideally he would receive 2 weeks of IV antibiotics and then we will work to provide dose of long acting Ortiavancin with oral doxycyline with goal of treatment of at least 8 weeks.   Therapeutic Drug Monitoring - CK levels stable. Will check CMET.  Lethargy/Encephalopathy -  Unclear origin. Does not appear to have had any recent medications prior to assessment although he did receive lorazepam around 7am this morning. Will check ammonia levels given cirrhosis. Continue management per primary team.   Principal Problem:   MRSA bacteremia Active Problems:   Septic arthritis ([place])   Cirrhosis, alcoholic ([place])   Polysubstance abuse ([place])   Hyponatremia   Pancytopenia ([name])   Hepatic encephalopathy ([name])   Alcohol abuse   Anemia of chronic disease   Hypoalbuminemia   Hypoglycemia without diagnosis of diabetes mellitus   Epidural abscess   Abscess in epidural space of lumbar spine   . feeding supplement (ENSURE ENLIVE)  237 mL  Oral TID BM  . folic acid  1 mg Oral Daily  . lactulose  30 g Oral BID  . LORazepam      . multivitamin with minerals  1 tablet Oral Daily  . nadolol  20 mg Oral Daily  . thiamine  100 mg Oral Daily    SUBJECTIVE:  Events overnight noted with agitation. Blood cultures remain without growth. Mark [name] is lethargic today and does not provide any history.   No Known Allergies   Review of Systems: Review of Systems  Unable to perform ROS: Medical condition    OBJECTIVE: Vitals:   09/23/18 0631 09/23/18 0649 09/23/18 0830 09/23/18 0946  BP: (!) 172/151 (!) 148/90  (!) 157/72  Pulse: 72 69  67  Resp:      Temp: 98.2 F (36.8 C)  97.8 F (36.6 C) (!) 97.4 F (36.3 C)  TempSrc: Axillary   Oral  SpO2: 99%   100%  Weight:      Height:       Body mass index is 23.74 kg/m.  Physical Exam Constitutional:      General: He is not in acute distress.    Appearance: He is well-developed. He is ill-appearing.     Comments: Appears older than his stated age.   Cardiovascular:     Rate and Rhythm: Normal rate and regular rhythm.     Heart sounds: Normal heart sounds.  Pulmonary:     Effort: Pulmonary effort is normal.     Breath sounds: Normal breath sounds. No wheezing or rales.  Abdominal:  General: Abdomen is flat. Bowel sounds are normal.  Skin:    General: Skin is warm and dry.  Neurological:     Mental Status: He is lethargic.     Lab Results Lab Results  Component Value Date   WBC 11.3 (H) 09/22/2018   HGB 8.2 (L) 09/22/2018   HCT 24.3 (L) 09/22/2018   MCV 92.7 09/22/2018   PLT 56 (L) 09/22/2018    Lab Results  Component Value Date   CREATININE 2.18 (H) 09/22/2018   BUN 40 (H) 09/22/2018   NA 133 (L) 09/22/2018   K 3.9 09/22/2018   CL 106 09/22/2018   CO2 18 (L) 09/22/2018    Lab Results  Component Value Date   ALT 11 09/22/2018   AST 27 09/22/2018   ALKPHOS 103 09/22/2018   BILITOT 1.0 09/22/2018     Microbiology: Recent Results (from the  past 240 hour(s))  Culture, blood (routine x 2)     Status: Abnormal   Collection Time: 09/20/18  7:25 AM  Result Value Ref Range Status   Specimen Description BLOOD RIGHT ANTECUBITAL  Final   Special Requests   Final    BOTTLES DRAWN AEROBIC AND ANAEROBIC Blood Culture results may not be optimal due to an excessive volume of blood received in culture bottles   Culture  Setup Time   Final    IN BOTH AEROBIC AND ANAEROBIC BOTTLES GRAM POSITIVE COCCI CRITICAL RESULT CALLED TO, READ BACK BY AND VERIFIED WITH: Salli Real [id number] 09/21/2018 Mena Goes Performed at [facility], [place] [address] [place], [place] [address]    Culture METHICILLIN RESISTANT STAPHYLOCOCCUS AUREUS (A)  Final   Report Status 09/22/2018 FINAL  Final   Organism ID, Bacteria METHICILLIN RESISTANT STAPHYLOCOCCUS AUREUS  Final      Susceptibility   Methicillin resistant staphylococcus aureus - MIC*    CIPROFLOXACIN <=0.5 SENSITIVE Sensitive     ERYTHROMYCIN >=8 RESISTANT Resistant     GENTAMICIN <=0.5 SENSITIVE Sensitive     OXACILLIN RESISTANT Resistant     TETRACYCLINE <=1 SENSITIVE Sensitive     VANCOMYCIN 1 SENSITIVE Sensitive     TRIMETH/SULFA <=10 SENSITIVE Sensitive     CLINDAMYCIN <=0.25 SENSITIVE Sensitive     RIFAMPIN <=0.5 SENSITIVE Sensitive     Inducible Clindamycin NEGATIVE Sensitive     * METHICILLIN RESISTANT STAPHYLOCOCCUS AUREUS  Blood Culture ID Panel (Reflexed)     Status: Abnormal   Collection Time: 09/20/18  7:25 AM  Result Value Ref Range Status   Enterococcus species NOT DETECTED NOT DETECTED Final   Listeria monocytogenes NOT DETECTED NOT DETECTED Final   Staphylococcus species DETECTED (A) NOT DETECTED Final    Comment: CRITICAL RESULT CALLED TO, READ BACK BY AND VERIFIED WITH: Nadean Corwin [id number] 09/21/2018 T. TYSOR    Staphylococcus aureus (BCID) DETECTED (A) NOT DETECTED Final    Comment: Methicillin (oxacillin)-resistant Staphylococcus aureus (MRSA). MRSA is predictably  resistant to beta-lactam antibiotics (except ceftaroline). Preferred therapy is vancomycin unless clinically contraindicated. Patient requires contact precautions if  hospitalized. CRITICAL RESULT CALLED TO, READ BACK BY AND VERIFIED WITH: G. ABBOTT,PHARMD 0226 09/21/2018 T. TYSOR    Methicillin resistance DETECTED (A) NOT DETECTED Final    Comment: CRITICAL RESULT CALLED TO, READ BACK BY AND VERIFIED WITH: G. ABBOTT,PHARMD 0226 09/21/2018 T. TYSOR    Streptococcus species NOT DETECTED NOT DETECTED Final   Streptococcus agalactiae NOT DETECTED NOT DETECTED Final   Streptococcus pneumoniae NOT DETECTED NOT DETECTED Final   Streptococcus pyogenes  NOT DETECTED NOT DETECTED Final   Acinetobacter baumannii NOT DETECTED NOT DETECTED Final   Enterobacteriaceae species NOT DETECTED NOT DETECTED Final   Enterobacter cloacae complex NOT DETECTED NOT DETECTED Final   Escherichia coli NOT DETECTED NOT DETECTED Final   Klebsiella oxytoca NOT DETECTED NOT DETECTED Final   Klebsiella pneumoniae NOT DETECTED NOT DETECTED Final   Proteus species NOT DETECTED NOT DETECTED Final   Serratia marcescens NOT DETECTED NOT DETECTED Final   Haemophilus influenzae NOT DETECTED NOT DETECTED Final   Neisseria meningitidis NOT DETECTED NOT DETECTED Final   Pseudomonas aeruginosa NOT DETECTED NOT DETECTED Final   Candida albicans NOT DETECTED NOT DETECTED Final   Candida glabrata NOT DETECTED NOT DETECTED Final   Candida krusei NOT DETECTED NOT DETECTED Final   Candida parapsilosis NOT DETECTED NOT DETECTED Final   Candida tropicalis NOT DETECTED NOT DETECTED Final    Comment: Performed at [facility], [place] [address], [place], [place] [address]  Culture, blood (routine x 2)     Status: Abnormal   Collection Time: 09/20/18  7:34 AM  Result Value Ref Range Status   Specimen Description BLOOD LEFT ANTECUBITAL  Final   Special Requests   Final    BOTTLES DRAWN AEROBIC AND ANAEROBIC Blood Culture adequate  volume   Culture  Setup Time   Final    IN BOTH AEROBIC AND ANAEROBIC BOTTLES GRAM POSITIVE COCCI CRITICAL RESULT CALLED TO, READ BACK BY AND VERIFIED WITH: G. ABBOTT,PHARMD 0226 09/21/2018 T. TYSOR    Culture (A)  Final    STAPHYLOCOCCUS AUREUS SUSCEPTIBILITIES PERFORMED ON PREVIOUS CULTURE WITHIN THE LAST 5 DAYS. Performed at [facility], [place] [address], [place], [place] [address]    Report Status 09/22/2018 FINAL  Final  SARS Coronavirus 2 (CEPHEID - Performed in [facility] lab), Hosp Order     Status: None   Collection Time: 09/20/18  7:34 AM  Result Value Ref Range Status   SARS Coronavirus 2 NEGATIVE NEGATIVE Final    Comment: (NOTE) If result is NEGATIVE SARS-CoV-2 target nucleic acids are NOT DETECTED. The SARS-CoV-2 RNA is generally detectable in upper and lower  respiratory specimens during the acute phase of infection. The lowest  concentration of SARS-CoV-2 viral copies this assay can detect is 250  copies / mL. A negative result does not preclude SARS-CoV-2 infection  and should not be used as the sole basis for treatment or other  patient management decisions.  A negative result may occur with  improper specimen collection / handling, submission of specimen other  than nasopharyngeal swab, presence of viral mutation(s) within the  areas targeted by this assay, and inadequate number of viral copies  (<250 copies / mL). A negative result must be combined with clinical  observations, patient history, and epidemiological information. If result is POSITIVE SARS-CoV-2 target nucleic acids are DETECTED. The SARS-CoV-2 RNA is generally detectable in upper and lower  respiratory specimens dur ing the acute phase of infection.  Positive  results are indicative of active infection with SARS-CoV-2.  Clinical  correlation with patient history and other diagnostic information is  necessary to determine patient infection status.  Positive results do  not rule out  bacterial infection or co-infection with other viruses. If result is PRESUMPTIVE POSTIVE SARS-CoV-2 nucleic acids MAY BE PRESENT.   A presumptive positive result was obtained on the submitted specimen  and confirmed on repeat testing.  While 2019 novel coronavirus  (SARS-CoV-2) nucleic acids may be present  in the submitted sample  additional confirmatory testing may be necessary for epidemiological  and / or clinical management purposes  to differentiate between  SARS-CoV-2 and other Sarbecovirus currently known to infect humans.  If clinically indicated additional testing with an alternate test  methodology [phone]) is advised. The SARS-CoV-2 RNA is generally  detectable in upper and lower respiratory sp ecimens during the acute  phase of infection. The expected result is Negative. Fact Sheet for Patients:  StrictlyIdeas.no Fact Sheet for Healthcare Providers: BankingDealers.co.za This test is not yet approved or cleared by the [place] FDA and has been authorized for detection and/or diagnosis of SARS-CoV-2 by FDA under an Emergency Use Authorization (EUA).  This EUA will remain in effect (meaning this test can be used) for the duration of the COVID-19 declaration under Section 564(b)(1) of the Act, 21 U.S.C. section 360bbb-3(b)(1), unless the authorization is terminated or revoked sooner. Performed at [facility], [place] [address], [place], [place] [address]   Culture, blood (routine x 2)     Status: None (Preliminary result)   Collection Time: 09/21/18 12:23 PM  Result Value Ref Range Status   Specimen Description BLOOD LEFT ANTECUBITAL  Final   Special Requests   Final    BOTTLES DRAWN AEROBIC ONLY Blood Culture results may not be optimal due to an inadequate volume of blood received in culture bottles   Culture   Final    NO GROWTH < 24 HOURS Performed at [facility], [place] [address], [place], [place] [address]     Report Status PENDING  Incomplete  Culture, blood (routine x 2)     Status: None (Preliminary result)   Collection Time: 09/21/18  3:45 PM  Result Value Ref Range Status   Specimen Description BLOOD RIGHT HAND  Final   Special Requests   Final    BOTTLES DRAWN AEROBIC ONLY Blood Culture results may not be optimal due to an inadequate volume of blood received in culture bottles   Culture   Final    NO GROWTH < 24 HOURS Performed at [facility], [place] [address], [place], [place] [address]    Report Status PENDING  Incomplete     Terri Piedra, [place] [facility] [place] Pager  09/23/2018  11:38 AM

## 2018-09-23 NOTE — Progress Notes (Signed)
Notified MD patient remains lethargic and did not receive full dose of lactulose. New orders to give lactulose enema and narcan 0.2mg . When entered room to give narcan patient still only moaning in verbal response. Gave narcan iv and patient began dry heaving and gurgling sounds noted. Position high fowlers and suction mouth. Rapid called to further assess, then MD called to bedside. Patient vomited green bile, suctioned. Given IV d50 for hypoglycemia. Vitals stable. Encouraged patient to cough for suctioning. Patient a little more alert.

## 2018-09-23 NOTE — Significant Event (Addendum)
Rapid Response Event Note  Overview: Respiratory Distress  Initial Focused Assessment: Called by staff about patient having respiratory distress after receiving Narcan 0.2 mg IV. Upon arrival, as I walked in, patient vomited and was immediately suctioned. Per nurse, patient was somnolent all day, had agitation last night that required multiple doses of Ativan, did received some opioids night before last for back pain. Patient was grunting, increased work of breathing, and very hard to arouse. Blood sugar was 59 and it was treated to 31mL of D5. 97% on RA, lung sounds wheezing in the upper fields and scattered rhonchi. HR and BP were normal. I was concerned that patient might have had a stroke like event because I did not see move the RT side of this body initially, and it was hard to assess for facial asymmetry d/t to the lack of teeth/dentures, however once blood sugar improved, patient did follow simple commands, equal strong grips, able to move legs spontaneous, sensory intact (responds to painful stimuli in all extremities). MD was present the entire time I was there.   Interventions: -- STAT ABG - WNL  Plan of Care: -- Safety Sitter -- RN to administer Lactulose Enema (has not received Lactulose d/t to AMS). -- Aspiration Precautions -- Keep HOB > 30, suction as bedside, oxygen device etc -- Delirium Precautions  -- PLEASE REASSESS PATIENT AFTER GIVING ANY SEDATING MEDICATIONS AND AVOID USING IF POSSIBLE.  -- HIGH RISK FOR NEEDING INTUBATION   Event Summary: Call Time [place] [address] [place], [place]

## 2018-09-23 NOTE — Progress Notes (Signed)
Patient ID: Mark [name], male   DOB: 1956-01-01, 63 y.o.   MRN: [id number]  PROGRESS NOTE    Mark [name]  IRS:[id number] DOB: [birth year] DOA: 09/19/2018 PCP: [name], MD   Brief Narrative:  63 year old male with history of alcoholic liver cirrhosis, pancytopenia, hypertension, coronary artery disease, polysubstance abuse (alcohol, heroin, marijuana), CKD stage III and MSSA bacteremia in 04/2018 presented with worsening lower back pain.  COVID-19 rapid testing was negative on admission.  MRI of the lumbar spine revealed enhancement throughout the epidural space of L3 through the sacrum concerning for possible phlegmon/abscess, L5-S1 marrow enhancement concerning for septic arthritis, and spinal stenosis L3-S1. He was started on broad-spectrum antibiotics.  Neurosurgery was consulted.  Assessment & Plan:   Principal Problem:   MRSA bacteremia Active Problems:   Cirrhosis, alcoholic ([place])   Polysubstance abuse ([place])   Hyponatremia   Pancytopenia ([name])   Hepatic encephalopathy ([name])   Alcohol abuse   Septic arthritis ([name])   Anemia of chronic disease   Hypoalbuminemia   Hypoglycemia without diagnosis of diabetes mellitus   Epidural abscess   Abscess in epidural space of lumbar spine   Septic arthritis/epidural abscess of lumbar spine -MRI of the lumbar spine revealed enhancement throughout the epidural space of L3 through the sacrum concerning for possible phlegmon/abscess, L5-S1 marrow enhancement concerning for septic arthritis, and spinal stenosis L3-S1 -Patient was initially started on broad-spectrum antibiotics vancomycin and Zosyn.  Patient has been subsequently switched to daptomycin per ID. -Neurosurgery has been consulted and had recommended IR consultation for probable drainage/biopsy.  As per IR, the abscess/collection was not large enough to aspirate or drain.  IR has notified Dr. Zada Finders. -Neurosurgery found patient to be high risk for any surgical  intervention and is recommended conservative management with IV antibiotics only. -No evidence of loss of bowel or bladder control for now -Continue pain management and supportive care. -Patient remains afebrile.  MRSA bacteremia -Susceptibilities pending.  ID has been consulted automatically.  Currently on daptomycin.   -Repeat blood cultures without any growth. -2D echo: Preserved ejection fraction, no wall motion normalities, no vegetations appreciated on any valve.  Mild regurgitation and he tricuspid valve was appreciated by color flow Doppler..  Acute kidney injury with metabolic acidosis -Baseline creatinine of 0.95 on 08/22/2018.  Patient on Lasix and spironolactone at home which is on hold. -Presented with creatinine of 1.82.  Creatinine has worsened at 2.4 -Continue IV fluids -Follow renal function trend -Strict input and output. -Renal ultrasound, demonstrated no acute obstructive uropathy.  Alcoholic cirrhosis with hepatic encephalopathy -Continue lactulose. -Diuretics remains on hold. -Continue nadolol  Polysubstance abuse -Patient admits to alcohol and recent heroin use prior to arrival -Continue CIWA protocol.  Social worker consult.  Continue multivitamin, thiamine and folate. -Elevated CIWA score overnight with subsequent treatment with extra Ativan. -Patient is confused and lethargic this morning. -Will check ammonia level, to rule out any hepatic encephalopathy component. -Be careful with use of sedatives because of history of alcoholic cirrhosis.  Hyponatremia -Sodium 130 on admission. With a chronic component given cirrhosis.  -Continue gentle fluid resuscitation.  Hypoalbuminemia -Follow recommendations by nutritional service -We will use feeding supplements.  Generalized deconditioning -Overall prognosis is very poor.  Patient is noncompliant and actively using drugs.   -Will discuss regarding inpatient supervise therapy as he will require IV antibiotics  for the next 6 to 8 weeks minimum.  DVT prophylaxis: SCDs  Code Status: Full Family Communication: None at bedside Disposition Plan: Patient  not a candidate for IV antibiotic therapy given prior history of substance abuse.  Will require 6 to 8 weeks of IV antibiotics minimum.  Consultants:  Neurosurgery ID consulted automatically because of positive blood cultures  Procedures:  See below for x-ray reports 2D echo: IMPRESSIONS 1. The left ventricle has normal systolic function with an ejection fraction of 60-65%. The cavity size was normal. There is mildly increased left ventricular wall thickness. Left ventricular diastolic parameters were normal.  2. The right ventricle has normal systolic function. The cavity was normal. There is no increase in right ventricular wall thickness.  3. Left atrial size was severely dilated.  4. No evidence of mitral valve stenosis.  5. Mild thickening of the aortic valve. Mild calcification of the aortic valve. No stenosis of the aortic valve.  Antimicrobials: 1 dose of vancomycin Zosyn and Ancef on 09/20/2018 -Daptomycin from 09/21/2018 onwards   Subjective: Afebrile, no chest pain, no shortness of breath.  Patient is lethargic and confused currently.  Objective: Vitals:   09/23/18 0649 09/23/18 0830 09/23/18 0946 09/23/18 1153  BP: (!) 148/90  (!) 157/72 (!) 159/71  Pulse: 69  67 67  Resp:      Temp:  97.8 F (36.6 C) (!) 97.4 F (36.3 C) 98.8 F (37.1 C)  TempSrc:   Oral Axillary  SpO2:   100% 100%  Weight:      Height:        Intake/Output Summary (Last 24 hours) at 09/23/2018 1405 Last data filed at 09/23/2018 0600 Gross per 24 hour  Intake 4500 ml  Output --  Net 4500 ml   Filed Weights   09/19/18 2011 09/20/18 1055  Weight: 61.2 kg 64.7 kg    Examination: General exam: Afebrile, lethargic, no chest pain, no shortness of breath.  Per nursing report he was agitated and complaining of back pain throughout the night.  There was  concern for withdrawal symptoms and received Ativan. Respiratory system: Clear to auscultation. Respiratory effort normal. Cardiovascular system:RRR. No murmurs, rubs, gallops. Gastrointestinal system: Abdomen is nondistended, soft and nontender. No organomegaly or masses felt. Normal bowel sounds heard. Central nervous system: Alert and oriented. No focal neurological deficits. Extremities: No cyanosis or clubbing.  Moving 4 limbs without difficulties. Skin: No rashes, lesions or ulcers Psychiatry: Unable to fully assess secondary to current mental status.  Data Reviewed: I have personally reviewed following labs and imaging studies  CBC: Recent Labs  Lab 09/20/18 0726 09/21/18 0546 09/22/18 0427  WBC 4.9 7.9 11.3*  NEUTROABS 4.1  --  9.4*  HGB 9.1* 8.2* 8.2*  HCT 28.2* 24.3* 24.3*  MCV 98.3 94.6 92.7  PLT PLATELETS APPEAR DECREASED 53* 56*   Basic Metabolic Panel: Recent Labs  Lab 09/20/18 0726 09/21/18 0546 09/22/18 0427  NA 130* 129* 133*  K 4.2 4.1 3.9  CL 101 103 106  CO2 18* 16* 18*  GLUCOSE 56* 99 93  BUN 26* 37* 40*  CREATININE 1.82* 2.40* 2.18*  CALCIUM 8.1* 7.4* 7.4*  MG  --   --  1.9   GFR: Estimated Creatinine Clearance: 29.4 mL/min (A) (by C-G formula based on SCr of 2.18 mg/dL (H)).   Liver Function Tests: Recent Labs  Lab 09/20/18 0726 09/21/18 0546 09/22/18 0427  AST 34 30 27  ALT 15 12 11   ALKPHOS 120 94 103  BILITOT 1.8* 1.6* 1.0  PROT 6.0* 5.2* 4.9*  ALBUMIN 1.7* 1.3* 1.2*    Recent Labs  Lab 09/20/18 0930 09/21/18 0546  AMMONIA 111* 114*   Cardiac Enzymes: Recent Labs  Lab 09/22/18 0427  CKTOTAL 12*   CBG: Recent Labs  Lab 09/23/18 0201 09/23/18 0436 09/23/18 0521 09/23/18 0805 09/23/18 1152  GLUCAP 143* 74 82 74 76   Sepsis Labs: Recent Labs  Lab 09/20/18 0727 09/20/18 0905 09/20/18 1157  LATICACIDVEN 2.1* 2.8* 2.6*    Recent Results (from the past 240 hour(s))  Culture, blood (routine x 2)     Status:  Abnormal   Collection Time: 09/20/18  7:25 AM  Result Value Ref Range Status   Specimen Description BLOOD RIGHT ANTECUBITAL  Final   Special Requests   Final    BOTTLES DRAWN AEROBIC AND ANAEROBIC Blood Culture results may not be optimal due to an excessive volume of blood received in culture bottles   Culture  Setup Time   Final    IN BOTH AEROBIC AND ANAEROBIC BOTTLES GRAM POSITIVE COCCI CRITICAL RESULT CALLED TO, READ BACK BY AND VERIFIED WITH: Salli Real [id number] 09/21/2018 Mena Goes Performed at [facility], [place] [address], [place], [place] [address]    Culture METHICILLIN RESISTANT STAPHYLOCOCCUS AUREUS (A)  Final   Report Status 09/22/2018 FINAL  Final   Organism ID, Bacteria METHICILLIN RESISTANT STAPHYLOCOCCUS AUREUS  Final      Susceptibility   Methicillin resistant staphylococcus aureus - MIC*    CIPROFLOXACIN <=0.5 SENSITIVE Sensitive     ERYTHROMYCIN >=8 RESISTANT Resistant     GENTAMICIN <=0.5 SENSITIVE Sensitive     OXACILLIN RESISTANT Resistant     TETRACYCLINE <=1 SENSITIVE Sensitive     VANCOMYCIN 1 SENSITIVE Sensitive     TRIMETH/SULFA <=10 SENSITIVE Sensitive     CLINDAMYCIN <=0.25 SENSITIVE Sensitive     RIFAMPIN <=0.5 SENSITIVE Sensitive     Inducible Clindamycin NEGATIVE Sensitive     * METHICILLIN RESISTANT STAPHYLOCOCCUS AUREUS  Blood Culture ID Panel (Reflexed)     Status: Abnormal   Collection Time: 09/20/18  7:25 AM  Result Value Ref Range Status   Enterococcus species NOT DETECTED NOT DETECTED Final   Listeria monocytogenes NOT DETECTED NOT DETECTED Final   Staphylococcus species DETECTED (A) NOT DETECTED Final    Comment: CRITICAL RESULT CALLED TO, READ BACK BY AND VERIFIED WITH: Nadean Corwin [id number] 09/21/2018 T. TYSOR    Staphylococcus aureus (BCID) DETECTED (A) NOT DETECTED Final    Comment: Methicillin (oxacillin)-resistant Staphylococcus aureus (MRSA). MRSA is predictably resistant to beta-lactam antibiotics (except ceftaroline).  Preferred therapy is vancomycin unless clinically contraindicated. Patient requires contact precautions if  hospitalized. CRITICAL RESULT CALLED TO, READ BACK BY AND VERIFIED WITH: G. ABBOTT,PHARMD 0226 09/21/2018 T. TYSOR    Methicillin resistance DETECTED (A) NOT DETECTED Final    Comment: CRITICAL RESULT CALLED TO, READ BACK BY AND VERIFIED WITH: G. ABBOTT,PHARMD 0226 09/21/2018 T. TYSOR    Streptococcus species NOT DETECTED NOT DETECTED Final   Streptococcus agalactiae NOT DETECTED NOT DETECTED Final   Streptococcus pneumoniae NOT DETECTED NOT DETECTED Final   Streptococcus pyogenes NOT DETECTED NOT DETECTED Final   Acinetobacter baumannii NOT DETECTED NOT DETECTED Final   Enterobacteriaceae species NOT DETECTED NOT DETECTED Final   Enterobacter cloacae complex NOT DETECTED NOT DETECTED Final   Escherichia coli NOT DETECTED NOT DETECTED Final   Klebsiella oxytoca NOT DETECTED NOT DETECTED Final   Klebsiella pneumoniae NOT DETECTED NOT DETECTED Final   Proteus species NOT DETECTED NOT DETECTED Final   Serratia marcescens NOT DETECTED NOT DETECTED Final   Haemophilus influenzae NOT DETECTED NOT DETECTED  Final   Neisseria meningitidis NOT DETECTED NOT DETECTED Final   Pseudomonas aeruginosa NOT DETECTED NOT DETECTED Final   Candida albicans NOT DETECTED NOT DETECTED Final   Candida glabrata NOT DETECTED NOT DETECTED Final   Candida krusei NOT DETECTED NOT DETECTED Final   Candida parapsilosis NOT DETECTED NOT DETECTED Final   Candida tropicalis NOT DETECTED NOT DETECTED Final    Comment: Performed at [facility], [place] [address], [place], [place] [address]  Culture, blood (routine x 2)     Status: Abnormal   Collection Time: 09/20/18  7:34 AM  Result Value Ref Range Status   Specimen Description BLOOD LEFT ANTECUBITAL  Final   Special Requests   Final    BOTTLES DRAWN AEROBIC AND ANAEROBIC Blood Culture adequate volume   Culture  Setup Time   Final    IN BOTH AEROBIC AND  ANAEROBIC BOTTLES GRAM POSITIVE COCCI CRITICAL RESULT CALLED TO, READ BACK BY AND VERIFIED WITH: G. ABBOTT,PHARMD 0226 09/21/2018 T. TYSOR    Culture (A)  Final    STAPHYLOCOCCUS AUREUS SUSCEPTIBILITIES PERFORMED ON PREVIOUS CULTURE WITHIN THE LAST 5 DAYS. Performed at [facility], [place] [address] [place], [place] [address]    Report Status 09/22/2018 FINAL  Final  SARS Coronavirus 2 (CEPHEID - Performed in [place] hospital lab), Hosp Order     Status: None   Collection Time: 09/20/18  7:34 AM  Result Value Ref Range Status   SARS Coronavirus 2 NEGATIVE NEGATIVE Final    Comment: (NOTE) If result is NEGATIVE SARS-CoV-2 target nucleic acids are NOT DETECTED. The SARS-CoV-2 RNA is generally detectable in upper and lower  respiratory specimens during the acute phase of infection. The lowest  concentration of SARS-CoV-2 viral copies this assay can detect is 250  copies / mL. A negative result does not preclude SARS-CoV-2 infection  and should not be used as the sole basis for treatment or other  patient management decisions.  A negative result may occur with  improper specimen collection / handling, submission of specimen other  than nasopharyngeal swab, presence of viral mutation(s) within the  areas targeted by this assay, and inadequate number of viral copies  (<250 copies / mL). A negative result must be combined with clinical  observations, patient history, and epidemiological information. If result is POSITIVE SARS-CoV-2 target nucleic acids are DETECTED. The SARS-CoV-2 RNA is generally detectable in upper and lower  respiratory specimens dur ing the acute phase of infection.  Positive  results are indicative of active infection with SARS-CoV-2.  Clinical  correlation with patient history and other diagnostic information is  necessary to determine patient infection status.  Positive results do  not rule out bacterial infection or co-infection with other viruses. If  result is PRESUMPTIVE POSTIVE SARS-CoV-2 nucleic acids MAY BE PRESENT.   A presumptive positive result was obtained on the submitted specimen  and confirmed on repeat testing.  While 2019 novel coronavirus  (SARS-CoV-2) nucleic acids may be present in the submitted sample  additional confirmatory testing may be necessary for epidemiological  and / or clinical management purposes  to differentiate between  SARS-CoV-2 and other Sarbecovirus currently known to infect humans.  If clinically indicated additional testing with an alternate test  methodology [phone]) is advised. The SARS-CoV-2 RNA is generally  detectable in upper and lower respiratory sp ecimens during the acute  phase of infection. The expected result is Negative. Fact Sheet for Patients:  StrictlyIdeas.no Fact Sheet for Healthcare Providers:  BankingDealers.co.za This test is not yet approved or cleared by the [place] and has been authorized for detection and/or diagnosis of SARS-CoV-2 by FDA under an Emergency Use Authorization (EUA).  This EUA will remain in effect (meaning this test can be used) for the duration of the COVID-19 declaration under Section 564(b)(1) of the Act, 21 U.S.C. section 360bbb-3(b)(1), unless the authorization is terminated or revoked sooner. Performed at [facility], [place] [address], [place], [place] [address]   Culture, blood (routine x 2)     Status: None (Preliminary result)   Collection Time: 09/21/18 12:23 PM  Result Value Ref Range Status   Specimen Description BLOOD LEFT ANTECUBITAL  Final   Special Requests   Final    BOTTLES DRAWN AEROBIC ONLY Blood Culture results may not be optimal due to an inadequate volume of blood received in culture bottles   Culture   Final    NO GROWTH < 24 HOURS Performed at [facility], [place] [address] [place], [place] [address]    Report Status PENDING  Incomplete  Culture, blood  (routine x 2)     Status: None (Preliminary result)   Collection Time: 09/21/18  3:45 PM  Result Value Ref Range Status   Specimen Description BLOOD RIGHT HAND  Final   Special Requests   Final    BOTTLES DRAWN AEROBIC ONLY Blood Culture results may not be optimal due to an inadequate volume of blood received in culture bottles   Culture   Final    NO GROWTH < 24 HOURS Performed at [facility], [place] [address], [place], [place] [address]    Report Status PENDING  Incomplete     Radiology Studies: US Renal  Result Date: 09/21/2018 CLINICAL DATA:  Acute renal failure EXAM: RENAL / URINARY TRACT ULTRASOUND COMPLETE COMPARISON:  None. FINDINGS: Right Kidney: Renal measurements: 10.7 x 4.4 x 4.6 cm = volume: 113 mL . Echogenicity within normal limits. No mass or hydronephrosis visualized. Left Kidney: Renal measurements: 10.2 x 4.3 x 4.3 cm = volume: 98 mL. Echogenicity within normal limits. No mass or hydronephrosis visualized. There is a cyst at the lower pole measuring 4.3 x 2.8 x 2.9 cm. Bladder: Appears normal for degree of bladder distention. Other: Small volume abdominal ascites and incompletely visualized bilateral pleural effusions. IMPRESSION: 1. Normal appearance of the kidneys. 2. Bilateral pleural effusions and small volume abdominal ascites. Electronically Signed   By: Ulyses Jarred M.D.   On: 09/21/2018 17:05    Scheduled Meds:  feeding supplement (ENSURE ENLIVE)  237 mL Oral TID BM   folic acid  1 mg Oral Daily   lactulose  30 g Oral TID   LORazepam       multivitamin with minerals  1 tablet Oral Daily   nadolol  20 mg Oral Daily   thiamine  100 mg Oral Daily   Continuous Infusions:  sodium chloride 125 mL/hr at 09/23/18 1151   DAPTOmycin (CUBICIN)  IV Stopped (09/21/18 1124)     LOS: 3 days    Barton Dubois, MD [phone]  [facility] 09/23/2018, 2:05 PM

## 2018-09-23 NOTE — Plan of Care (Signed)
Patient has been resting throughout shift, responds to sternal rub/or ADL care such as turning or pericare by moaning or mumbling unclear words. Some resistance to turning with mumbling at staff. Attempted to feed patient but still not fully alert/oriented. Held oral pills today, attempted lactulose but only able to get 10mg  into patient before he became too drowsy. Wife updated over the phone and seemed certain patient was behaving like this due to lack of lactulose and being given narcotics yesterday. Will continue to monitor. Problem: Elimination: Goal: Will not experience complications related to bowel motility Outcome: Progressing Goal: Will not experience complications related to urinary retention Outcome: Progressing   Problem: Pain Managment: Goal: General experience of comfort will improve Outcome: Progressing   Problem: Metabolic: Goal: Ability to maintain appropriate glucose levels will improve Outcome: Progressing

## 2018-09-23 NOTE — Progress Notes (Signed)
Pharmacy Antibiotic Note  Mark [name] is a 63 y.o. male admitted on 09/19/2018 with epidural abscess and septic arthritis of L3-L4 facet joints complicated with MRSA bacteremia. The patient has been on Daptomycin per ID recommendations however with respiratory distress this evening - concerned for PNA and plans are to add Zosyn.   Plan:  - Start Zosyn 3.375g IV x1 followed by every 8 hours (infused over 4 hours) - Adjust Daptomycin to 500 mg IV every 24 hours due to improving renal function - Weekly CK checks - Will continue to follow renal function, culture results, LOT, and antibiotic de-escalation plans   Height: 5\' 5"  (165.1 cm) Weight: 142 lb 10.2 oz (64.7 kg) IBW/kg (Calculated) : 61.5  Temp (24hrs), Avg:98 F (36.7 C), Min:97.4 F (36.3 C), Max:98.8 F (37.1 C)  Recent Labs  Lab 09/20/18 0726 09/20/18 0727 09/20/18 0905 09/20/18 1157 09/21/18 0546 09/22/18 0427 09/23/18 1757  WBC 4.9  --   --   --  7.9 11.3*  --   CREATININE 1.82*  --   --   --  2.40* 2.18* 1.52*  LATICACIDVEN  --  2.1* 2.8* 2.6*  --   --   --     Estimated Creatinine Clearance: 42.1 mL/min (A) (by C-G formula based on SCr of 1.52 mg/dL (H)).    No Known Allergies  Antimicrobials this admission: 5/25 Vancomycin>>5/25 5/25 Zosyn>>5/25 5/26 Cefazolin x 1 dose 5/26 Daptomycin >>  Dose adjustments this admission: N/A  Microbiology results: 5/25 BCx: MRSA 5/25 Covid negative 5/26 BCx >> ngtd  Thank you for allowing pharmacy to be a part of this patient's care.  Alycia Rossetti, PharmD, BCPS Clinical Pharmacist Clinical phone for 09/23/2018: [phone] 09/23/2018 10:31 PM   **Pharmacist phone directory can now be found on [email or web address] ([email or web address]).  Listed under [place].

## 2018-09-23 NOTE — Progress Notes (Signed)
Given lactulose enema patient was combative towards nurses when putting tubing into rectum. Green stool noted.

## 2018-09-23 NOTE — Progress Notes (Signed)
Follow-up to rapid response call.  Pt still agitated after receiving 4mg  ativan at 0351.  Pt however, altered from earlier exam, not speaking or following commands, just moaning.  CBG-84, HR-77, BP-167/78, RR-22, SpO2-95 on RA. After repeated stimulation, pt was able to say name, and squeeze hands. ? If 4mg  ativan too much at once for patient. Pt CIWA >8 and, per CIWA protocol, pt needs ativan, however question whether pt would do better with a decreased dose more often.  Baltazar Najjar, NP paged and dose changed to 1mg  ativan q2h prn for CIWA> 8. Posey belt also ordered d/t pt repeated trying to get OOB.

## 2018-09-23 NOTE — Plan of Care (Signed)
  Problem: Education: Goal: Knowledge of General Education information will improve Description Including pain rating scale, medication(s)/side effects and non-pharmacologic comfort measures 09/23/2018 0456 by Rennie Plowman, RN Outcome: Progressing 09/23/2018 0405 by Rennie Plowman, RN Outcome: Progressing   Problem: Health Behavior/Discharge Planning: Goal: Ability to manage health-related needs will improve 09/23/2018 0456 by Rennie Plowman, RN Outcome: Progressing 09/23/2018 0405 by Rennie Plowman, RN Outcome: Progressing   Problem: Elimination: Goal: Will not experience complications related to bowel motility Outcome: Progressing Goal: Will not experience complications related to urinary retention Outcome: Progressing   Problem: Safety: Goal: Ability to remain free from injury will improve Outcome: Progressing   Problem: Safety: Goal: Ability to remain free from injury will improve 09/23/2018 0456 by Rennie Plowman, RN Outcome: Progressing 09/23/2018 0405 by Rennie Plowman, RN Outcome: Progressing Note:  wal   Problem: Nutrition: Goal: Adequate nutrition will be maintained Outcome: Not Progressing Note:  Patient currently on regular diet, had poor appetite at supper.  Ate 5 bites.   Problem: Pain Managment: Goal: General experience of comfort will improve Outcome: Not Progressing Note:  No progress made towards goals; patient continues to complain of pain 10/10 despite administration of opioid analgesic.  Patient lethargic, but continues to complain of pain 10/10/   Problem: Education: Goal: Knowledge of disease or condition will improve 09/23/2018 0456 by Rennie Plowman, RN Outcome: Not Progressing Note:  No progress towards goals; patient has a high CIWA.  Patient not receptive to teaching.   09/23/2018 0405 by Rennie Plowman, RN Outcome: Not Progressing Note:  Patient has high CIWA score, patient not receptivet o teaching or instruction.   Additional time needed to complete goals.   Problem: Health Behavior/Discharge Planning: Goal: Ability to identify changes in lifestyle to reduce recurrence of condition will improve 09/23/2018 0456 by Rennie Plowman, RN Outcome: Not Progressing Note:  No progress towards goals; patient has a high CIWA.  Patient not receptive to teaching.  No interest in making any life changes. 09/23/2018 0405 by Rennie Plowman, RN Outcome: Not Progressing Note:  Patient has high CIWA score, patient not receptive to teaching or instruction.  Additional time needed to complete goals.   Problem: Physical Regulation: Goal: Complications related to the disease process, condition or treatment will be avoided or minimized 09/23/2018 0456 by Rennie Plowman, RN Outcome: Not Progressing Note:  No progress towards goals; patient has a high CIWA.  Patient not receptive to teaching.   09/23/2018 0405 by Rennie Plowman, RN Outcome: Not Progressing Note:  Patient has high CIWA score, patient not receptive to teaching or instruction.  Additional time needed to complete goals.  Patient currently receiving IV ativan to manage withdrawal.

## 2018-09-23 NOTE — Significant Event (Addendum)
Rapid Response Event Note  Overview: called as second set of eyes. CIWA 11 at 0000 2mg  Ativan IV given.  RN called at 0300 due to pt increased agitation, CIWA 26 per bedside RN. Time Called: 0300 Arrival Time: 0315 Event Type: Neurologic  Initial Focused Assessment: Upon entering room, pt agitated and trying to get up.  Pt alert to self and time, follows commands, and moves all extremities.  With decrease stimulation pt began to go to sleep. Asked RN to hold off giving PRN Ativan at this time due to patient resting.  HR-80s, RR-24   Interventions: Pt assessed, no need for Ativan at this time. Order obtained by bedside RN PTA RRT for Ativan Q4h if CIWA >5. Plan of Care (if not transferred): Continue to monitor patient, if patient sustains CIWA >5 ok to give Ativan per order, however attempt decreased stimulation prior to Ativan administration. Call RRT if further assistance needed. Event Summary:   at      at    Outcome: Stayed in room and stabalized  Event End Time: 0330  Dillard [place]

## 2018-09-24 DIAGNOSIS — J690 Pneumonitis due to inhalation of food and vomit: Secondary | ICD-10-CM

## 2018-09-24 LAB — COMPREHENSIVE METABOLIC PANEL
ALT: 11 U/L (ref 0–44)
AST: 31 U/L (ref 15–41)
Albumin: 1.4 g/dL — ABNORMAL LOW (ref 3.5–5.0)
Alkaline Phosphatase: 152 U/L — ABNORMAL HIGH (ref 38–126)
Anion gap: 9 (ref 5–15)
BUN: 36 mg/dL — ABNORMAL HIGH (ref 8–23)
CO2: 18 mmol/L — ABNORMAL LOW (ref 22–32)
Calcium: 7.7 mg/dL — ABNORMAL LOW (ref 8.9–10.3)
Chloride: 115 mmol/L — ABNORMAL HIGH (ref 98–111)
Creatinine, Ser: 1.41 mg/dL — ABNORMAL HIGH (ref 0.61–1.24)
GFR calc Af Amer: >60 mL/min (ref >60)
GFR calc non Af Amer: 52 mL/min — ABNORMAL LOW (ref >60)
Glucose, Bld: 113 mg/dL — ABNORMAL HIGH (ref 70–99)
Potassium: 3.6 mmol/L (ref 3.5–5.1)
Sodium: 142 mmol/L (ref 135–145)
Total Bilirubin: 1.1 mg/dL (ref 0.3–1.2)
Total Protein: 5.6 g/dL — ABNORMAL LOW (ref 6.5–8.1)

## 2018-09-24 LAB — GLUCOSE, CAPILLARY
Glucose-Capillary: 99 mg/dL (ref 70–99)
Glucose-Capillary: 106 mg/dL — ABNORMAL HIGH (ref 70–99)
Glucose-Capillary: 112 mg/dL — ABNORMAL HIGH (ref 70–99)

## 2018-09-24 LAB — CBC
HCT: 25.3 % — ABNORMAL LOW (ref 39.0–52.0)
Hemoglobin: 8.2 g/dL — ABNORMAL LOW (ref 13.0–17.0)
MCH: 31.7 pg (ref 26.0–34.0)
MCHC: 32.4 g/dL (ref 30.0–36.0)
MCV: 97.7 fL (ref 80.0–100.0)
Platelets: 64 10*3/uL — ABNORMAL LOW (ref 150–400)
RBC: 2.59 MIL/uL — ABNORMAL LOW (ref 4.22–5.81)
RDW: 18.5 % — ABNORMAL HIGH (ref 11.5–15.5)
WBC: 13.8 10*3/uL — ABNORMAL HIGH (ref 4.0–10.5)
nRBC: 0.2 % (ref 0.0–0.2)

## 2018-09-24 LAB — AMMONIA: Ammonia: 17 umol/L (ref 9–35)

## 2018-09-24 MED ORDER — PROCHLORPERAZINE EDISYLATE 10 MG/2ML IJ SOLN
5.0000 mg | INTRAMUSCULAR | Status: DC | PRN
  Administered 2018-09-30: 5 mg via INTRAVENOUS
  Filled 2018-09-24: qty 2

## 2018-09-24 MED ORDER — DIAZEPAM 5 MG/ML PO CONC: 2.5000 mg | Freq: Two times a day (BID) | ORAL | Status: DC

## 2018-09-24 MED ORDER — OXYCODONE HCL 5 MG PO TABS
5.0000 mg | ORAL_TABLET | Freq: Three times a day (TID) | ORAL | Status: DC | PRN
  Administered 2018-09-26 – 2018-09-29 (×5): 5 mg via ORAL
  Filled 2018-09-24 (×6): qty 1

## 2018-09-24 MED ORDER — WHITE PETROLATUM EX OINT
TOPICAL_OINTMENT | CUTANEOUS | Status: AC
  Administered 2018-09-24: 1
  Filled 2018-09-24: qty 28.35

## 2018-09-24 MED ORDER — CLONIDINE HCL 0.2 MG/24HR TD PTWK
0.2000 mg | MEDICATED_PATCH | TRANSDERMAL | Status: DC
  Administered 2018-09-24 – 2018-10-01 (×2): 0.2 mg via TRANSDERMAL
  Filled 2018-09-24 (×2): qty 1

## 2018-09-24 MED ORDER — METOPROLOL TARTRATE 5 MG/5ML IV SOLN
2.5000 mg | Freq: Three times a day (TID) | INTRAVENOUS | Status: DC
  Administered 2018-09-24 – 2018-09-25 (×3): 2.5 mg via INTRAVENOUS
  Filled 2018-09-24 (×3): qty 5

## 2018-09-24 MED ORDER — MORPHINE SULFATE (PF) 2 MG/ML IV SOLN
1.0000 mg | Freq: Four times a day (QID) | INTRAVENOUS | Status: DC
  Administered 2018-09-24 – 2018-09-29 (×10): 1 mg via INTRAVENOUS
  Filled 2018-09-24 (×10): qty 1

## 2018-09-24 MED ORDER — SODIUM CHLORIDE 0.9 % IV SOLN
3.0000 g | Freq: Three times a day (TID) | INTRAVENOUS | Status: DC
  Administered 2018-09-24 – 2018-09-25 (×3): 3 g via INTRAVENOUS
  Filled 2018-09-24 (×9): qty 3

## 2018-09-24 MED ORDER — DIAZEPAM 5 MG PO TABS
2.5000 mg | ORAL_TABLET | Freq: Two times a day (BID) | ORAL | Status: DC
  Administered 2018-09-25 – 2018-09-30 (×11): 2.5 mg via ORAL
  Filled 2018-09-24 (×11): qty 1

## 2018-09-24 NOTE — Progress Notes (Signed)
Patient's daughter Eben Burow able to come and visit her father. She expressed concern about patient's wallet and cell-phone. When patient's daughter in his room, staff gave to her a bag with patient's wallet and cell-phone. Sitter at beside at that time.

## 2018-09-24 NOTE — Progress Notes (Signed)
Pt remains agitated and restless. Bodenheimer paged requesting sitter order be renewed.

## 2018-09-24 NOTE — Progress Notes (Signed)
[facility] for Infectious Disease  Date of Admission:  09/19/2018     Total days of antibiotics 4         ASSESSMENT/PLAN  Mr. Sanjuan is a 63 year old male with multiple medical problems including alcoholic cirrhosis, CKD stage III, and recent admission in January 2020 with MSSA bacteremia treated for 2 weeks with cefazolin who now has a lumbar epidural abscess and septic arthritis of L3-L4 facet joints complicated with MRSA bacteremia.  TTE negative for endocarditis.  Hospitalizations been complicated by a decreased mental status and most recent vomiting with concern for aspiration pneumonia.  MRSA bacteremia -repeat blood cultures remain without growth to date.  Continue to monitor cultures and continue current dose of daptomycin.  Lumbar epidural abscess and facet septic arthritis -continuing with medical management with daptomycin.  Will need at least 8 weeks of therapy and consider use of oritavancin with oral doxycycline pending disposition.  Therapeutic drug monitoring -CK levels remain stable.  Continue current dose of daptomycin.  Aspiration pneumonia -afebrile with wheezing and adventitious lung sounds on exam following vomiting with chest x-ray concerning for aspiration pneumonia.  Discontinue piperacillin-tazobactam and start Unasyn. Continue to monitor oxygenation. He remains high risk for aspiration given his recent altered mental status.   Cirrhosis - previous ammonia levels stable. Continue management per primary team.    Principal Problem:   MRSA bacteremia Active Problems:   Septic arthritis ([place])   Cirrhosis, alcoholic ([place])   Polysubstance abuse ([place])   Hyponatremia   Pancytopenia ([name])   Hepatic encephalopathy ([name])   Alcohol abuse   Anemia of chronic disease   Hypoalbuminemia   Hypoglycemia without diagnosis of diabetes mellitus   Epidural abscess   Abscess in epidural space of lumbar spine   . feeding supplement (ENSURE ENLIVE)  237 mL Oral TID  BM  . folic acid  1 mg Oral Daily  . lactulose  30 g Oral TID  . multivitamin with minerals  1 tablet Oral Daily  . nadolol  20 mg Oral Daily  . thiamine  100 mg Oral Daily    SUBJECTIVE:  Afebrile overnight with acute events of vomiting noted.  Mr. Malmstrom is more awake today, however speech is garbled and unintelligible most of the time.   No Known Allergies   Review of Systems: Review of Systems  Unable to perform ROS: Medical condition    OBJECTIVE: Vitals:   09/24/18 0458 09/24/18 0459 09/24/18 0500 09/24/18 0730  BP:    (!) 149/79  Pulse:    67  Resp: (!) 21 (!) 21 (!) 23 (!) 22  Temp:   98.6 F (37 C) 98.7 F (37.1 C)  TempSrc:    Axillary  SpO2:    100%  Weight:      Height:       Body mass index is 23.74 kg/m.  Physical Exam Constitutional:      General: He is not in acute distress.    Appearance: He is well-developed. He is ill-appearing.  Cardiovascular:     Rate and Rhythm: Normal rate and regular rhythm.     Heart sounds: Normal heart sounds.     Comments: Midline site wrapped in gauze and unable to be viewed. Pulmonary:     Effort: Pulmonary effort is normal.     Breath sounds: Wheezing and rhonchi present.  Skin:    General: Skin is warm and dry.  Neurological:     Mental Status: He is alert. He is disoriented.  Lab Results Lab Results  Component Value Date   WBC 13.8 (H) 09/24/2018   HGB 8.2 (L) 09/24/2018   HCT 25.3 (L) 09/24/2018   MCV 97.7 09/24/2018   PLT 64 (L) 09/24/2018    Lab Results  Component Value Date   CREATININE 1.41 (H) 09/24/2018   BUN 36 (H) 09/24/2018   NA 142 09/24/2018   K 3.6 09/24/2018   CL 115 (H) 09/24/2018   CO2 18 (L) 09/24/2018    Lab Results  Component Value Date   ALT 11 09/24/2018   AST 31 09/24/2018   ALKPHOS 152 (H) 09/24/2018   BILITOT 1.1 09/24/2018     Microbiology: Recent Results (from the past 240 hour(s))  Culture, blood (routine x 2)     Status: Abnormal   Collection Time:  09/20/18  7:25 AM  Result Value Ref Range Status   Specimen Description BLOOD RIGHT ANTECUBITAL  Final   Special Requests   Final    BOTTLES DRAWN AEROBIC AND ANAEROBIC Blood Culture results may not be optimal due to an excessive volume of blood received in culture bottles   Culture  Setup Time   Final    IN BOTH AEROBIC AND ANAEROBIC BOTTLES GRAM POSITIVE COCCI CRITICAL RESULT CALLED TO, READ BACK BY AND VERIFIED WITH: Salli Real [id number] 09/21/2018 Mena Goes Performed at [facility], [place] [address], [place], [place] [address]    Culture METHICILLIN RESISTANT STAPHYLOCOCCUS AUREUS (A)  Final   Report Status 09/22/2018 FINAL  Final   Organism ID, Bacteria METHICILLIN RESISTANT STAPHYLOCOCCUS AUREUS  Final      Susceptibility   Methicillin resistant staphylococcus aureus - MIC*    CIPROFLOXACIN <=0.5 SENSITIVE Sensitive     ERYTHROMYCIN >=8 RESISTANT Resistant     GENTAMICIN <=0.5 SENSITIVE Sensitive     OXACILLIN RESISTANT Resistant     TETRACYCLINE <=1 SENSITIVE Sensitive     VANCOMYCIN 1 SENSITIVE Sensitive     TRIMETH/SULFA <=10 SENSITIVE Sensitive     CLINDAMYCIN <=0.25 SENSITIVE Sensitive     RIFAMPIN <=0.5 SENSITIVE Sensitive     Inducible Clindamycin NEGATIVE Sensitive     * METHICILLIN RESISTANT STAPHYLOCOCCUS AUREUS  Blood Culture ID Panel (Reflexed)     Status: Abnormal   Collection Time: 09/20/18  7:25 AM  Result Value Ref Range Status   Enterococcus species NOT DETECTED NOT DETECTED Final   Listeria monocytogenes NOT DETECTED NOT DETECTED Final   Staphylococcus species DETECTED (A) NOT DETECTED Final    Comment: CRITICAL RESULT CALLED TO, READ BACK BY AND VERIFIED WITH: Nadean Corwin [id number] 09/21/2018 T. TYSOR    Staphylococcus aureus (BCID) DETECTED (A) NOT DETECTED Final    Comment: Methicillin (oxacillin)-resistant Staphylococcus aureus (MRSA). MRSA is predictably resistant to beta-lactam antibiotics (except ceftaroline). Preferred therapy is vancomycin  unless clinically contraindicated. Patient requires contact precautions if  hospitalized. CRITICAL RESULT CALLED TO, READ BACK BY AND VERIFIED WITH: G. ABBOTT,PHARMD 0226 09/21/2018 T. TYSOR    Methicillin resistance DETECTED (A) NOT DETECTED Final    Comment: CRITICAL RESULT CALLED TO, READ BACK BY AND VERIFIED WITH: G. ABBOTT,PHARMD 0226 09/21/2018 T. TYSOR    Streptococcus species NOT DETECTED NOT DETECTED Final   Streptococcus agalactiae NOT DETECTED NOT DETECTED Final   Streptococcus pneumoniae NOT DETECTED NOT DETECTED Final   Streptococcus pyogenes NOT DETECTED NOT DETECTED Final   Acinetobacter baumannii NOT DETECTED NOT DETECTED Final   Enterobacteriaceae species NOT DETECTED NOT DETECTED Final   Enterobacter cloacae complex NOT DETECTED NOT DETECTED Final  Escherichia coli NOT DETECTED NOT DETECTED Final   Klebsiella oxytoca NOT DETECTED NOT DETECTED Final   Klebsiella pneumoniae NOT DETECTED NOT DETECTED Final   Proteus species NOT DETECTED NOT DETECTED Final   Serratia marcescens NOT DETECTED NOT DETECTED Final   Haemophilus influenzae NOT DETECTED NOT DETECTED Final   Neisseria meningitidis NOT DETECTED NOT DETECTED Final   Pseudomonas aeruginosa NOT DETECTED NOT DETECTED Final   Candida albicans NOT DETECTED NOT DETECTED Final   Candida glabrata NOT DETECTED NOT DETECTED Final   Candida krusei NOT DETECTED NOT DETECTED Final   Candida parapsilosis NOT DETECTED NOT DETECTED Final   Candida tropicalis NOT DETECTED NOT DETECTED Final    Comment: Performed at [facility], [place] [address], [place], [place] [address]  Culture, blood (routine x 2)     Status: Abnormal   Collection Time: 09/20/18  7:34 AM  Result Value Ref Range Status   Specimen Description BLOOD LEFT ANTECUBITAL  Final   Special Requests   Final    BOTTLES DRAWN AEROBIC AND ANAEROBIC Blood Culture adequate volume   Culture  Setup Time   Final    IN BOTH AEROBIC AND ANAEROBIC BOTTLES GRAM POSITIVE  COCCI CRITICAL RESULT CALLED TO, READ BACK BY AND VERIFIED WITH: G. ABBOTT,PHARMD 0226 09/21/2018 T. TYSOR    Culture (A)  Final    STAPHYLOCOCCUS AUREUS SUSCEPTIBILITIES PERFORMED ON PREVIOUS CULTURE WITHIN THE LAST 5 DAYS. Performed at [facility], [place] [address] [place], [place] [address]    Report Status 09/22/2018 FINAL  Final  SARS Coronavirus 2 (CEPHEID - Performed in [facility] hospital lab), Hosp Order     Status: None   Collection Time: 09/20/18  7:34 AM  Result Value Ref Range Status   SARS Coronavirus 2 NEGATIVE NEGATIVE Final    Comment: (NOTE) If result is NEGATIVE SARS-CoV-2 target nucleic acids are NOT DETECTED. The SARS-CoV-2 RNA is generally detectable in upper and lower  respiratory specimens during the acute phase of infection. The lowest  concentration of SARS-CoV-2 viral copies this assay can detect is 250  copies / mL. A negative result does not preclude SARS-CoV-2 infection  and should not be used as the sole basis for treatment or other  patient management decisions.  A negative result may occur with  improper specimen collection / handling, submission of specimen other  than nasopharyngeal swab, presence of viral mutation(s) within the  areas targeted by this assay, and inadequate number of viral copies  (<250 copies / mL). A negative result must be combined with clinical  observations, patient history, and epidemiological information. If result is POSITIVE SARS-CoV-2 target nucleic acids are DETECTED. The SARS-CoV-2 RNA is generally detectable in upper and lower  respiratory specimens dur ing the acute phase of infection.  Positive  results are indicative of active infection with SARS-CoV-2.  Clinical  correlation with patient history and other diagnostic information is  necessary to determine patient infection status.  Positive results do  not rule out bacterial infection or co-infection with other viruses. If result is PRESUMPTIVE POSTIVE  SARS-CoV-2 nucleic acids MAY BE PRESENT.   A presumptive positive result was obtained on the submitted specimen  and confirmed on repeat testing.  While 2019 novel coronavirus  (SARS-CoV-2) nucleic acids may be present in the submitted sample  additional confirmatory testing may be necessary for epidemiological  and / or clinical management purposes  to differentiate between  SARS-CoV-2 and other Sarbecovirus currently known to infect humans.  If clinically indicated additional testing with an alternate test  methodology [phone]) is advised. The SARS-CoV-2 RNA is generally  detectable in upper and lower respiratory sp ecimens during the acute  phase of infection. The expected result is Negative. Fact Sheet for Patients:  StrictlyIdeas.no Fact Sheet for Healthcare Providers: BankingDealers.co.za This test is not yet approved or cleared by the [place] FDA and has been authorized for detection and/or diagnosis of SARS-CoV-2 by FDA under an Emergency Use Authorization (EUA).  This EUA will remain in effect (meaning this test can be used) for the duration of the COVID-19 declaration under Section 564(b)(1) of the Act, 21 U.S.C. section 360bbb-3(b)(1), unless the authorization is terminated or revoked sooner. Performed at [facility], [place] [address] [place], [place] [address]   Culture, blood (routine x 2)     Status: None (Preliminary result)   Collection Time: 09/21/18 12:23 PM  Result Value Ref Range Status   Specimen Description BLOOD LEFT ANTECUBITAL  Final   Special Requests   Final    BOTTLES DRAWN AEROBIC ONLY Blood Culture results may not be optimal due to an inadequate volume of blood received in culture bottles   Culture   Final    NO GROWTH 2 DAYS Performed at [facility], [place] [address], [place], [place] [address]    Report Status PENDING  Incomplete  Culture, blood (routine x 2)     Status: None  (Preliminary result)   Collection Time: 09/21/18  3:45 PM  Result Value Ref Range Status   Specimen Description BLOOD RIGHT HAND  Final   Special Requests   Final    BOTTLES DRAWN AEROBIC ONLY Blood Culture results may not be optimal due to an inadequate volume of blood received in culture bottles   Culture   Final    NO GROWTH 2 DAYS Performed at [facility], [place] [address], [place], [place] [address]    Report Status PENDING  Incomplete     Terri Piedra, [place] for [place] Pager  09/24/2018  11:32 AM

## 2018-09-24 NOTE — Progress Notes (Signed)
Pts wife and son able to come visit. Pt became more agitated. Wife expressed that "pt is acting like this cause he needs his lactulose". Family educated that on-call NP, Bodenheimer, is aware of pts mental status and inability to effectively swallow and does not feel necessity to administer a lactulose enema due to ammonia level being 17. Wife expressed that she mainly takes care of her husband and knows what he needs. Encouraged family to attend scheduled family meeting with the palliative care team so that her concerns can be addressed. Will notify day shift RN in AM.   Mark [name] continues to be at bedside.

## 2018-09-24 NOTE — Progress Notes (Signed)
Pt NPO per orders. Pt continues to have altered mentation. Bodenheimer paged concerning PO scheduled meds. Will hold PO meds per orders.

## 2018-09-24 NOTE — Progress Notes (Addendum)
Pharmacy Antibiotic Note  Mark [name] is a 63 y.o. male admitted on 09/19/2018 with epidural abscess and septic arthritis of L3-L4 facet joints complicated with MRSA bacteremia.  Daptomycin continues for MRSA bacteremia and Lumbar epidural abscess and facet septic arthritis , per ID. ID noted need at least 8 weeks of therapy   Orders received to discontinu zosyn and to start Unasyn for aspiration PNA.  Plan:  Unasyn 3g IV q8h  - continue Daptomycin to 500 mg IV every 24 hours - Weekly CK checks - Will continue to follow renal function, culture results, LOT, and antibiotic de-escalation plans   Height: 5\' 5"  (165.1 cm) Weight: 142 lb 10.2 oz (64.7 kg) IBW/kg (Calculated) : 61.5  Temp (24hrs), Avg:98.4 F (36.9 C), Min:98 F (36.7 C), Max:98.7 F (37.1 C)  Recent Labs  Lab 09/20/18 0726 09/20/18 0727 09/20/18 0905 09/20/18 1157 09/21/18 0546 09/22/18 0427 09/23/18 1757 09/24/18 0213  WBC 4.9  --   --   --  7.9 11.3*  --  13.8*  CREATININE 1.82*  --   --   --  2.40* 2.18* 1.52* 1.41*  LATICACIDVEN  --  2.1* 2.8* 2.6*  --   --   --   --     Estimated Creatinine Clearance: 45.4 mL/min (A) (by C-G formula based on SCr of 1.41 mg/dL (H)).    No Known Allergies  Antimicrobials this admission: 5/25 Vancomycin  >> 5/26 5/25 Zosyn >> 5/26; 5/28>5/29 5/26 Cefazolin >> 5/26 5/26 Daptomycin >>         5/27 baseline CK 12 5/29 Unasyn>>  Dose adjustments this admission: N/A  Microbiology results: 5/25 BCx: MRSA 5/25 Covid negative 5/26 BCx >> ngtd  Thank you for allowing pharmacy to be a part of this patient's care.  Nicole Cella, RPh Clinical Pharmacist Clinical phone for 09/24/2018: [phone] **Pharmacist phone directory can now be found on [email or web address] ([email or web address]).  Listed under [place]. 09/24/2018 12:13 PM

## 2018-09-24 NOTE — Progress Notes (Signed)
Patient was transferred to med-surg level of care per MD order. Will continue to monitor. 

## 2018-09-24 NOTE — Progress Notes (Signed)
Patient bathed at 2325 after having large bowel movement in bed.  Received call from telemetry unit, irregular HR noted with PAC and bigamy.  Rapid response notified, spoke to [facility].  On-call NP notified, stat EKG obtained.  Order for labs also obtained, IV team paged to draw labs and change midline dressing.  Rapid response RN on floor to assess patient.  No changes in mentation noted, patient remains awake, but does not answer questions.  Elevated BP noted,  Patient denies pain.  No guarding noted.  Elevated respiration noted, MD, on-call NP and rapid response RN aware.  Patient observed resting quietly in bed.

## 2018-09-24 NOTE — Progress Notes (Signed)
Patient ID: Mark [name], male   DOB: [birth year], 63 y.o.   MRN: [id number]  PROGRESS NOTE    Mark [name]  HUT:[id number] DOB: January 26, 1956 DOA: 09/19/2018 PCP: [name], MD   Brief Narrative:  63 year old male with history of alcoholic liver cirrhosis, pancytopenia, hypertension, coronary artery disease, polysubstance abuse (alcohol, heroin, marijuana), CKD stage III and MSSA bacteremia in 04/2018 presented with worsening lower back pain.  COVID-19 rapid testing was negative on admission.  MRI of the lumbar spine revealed enhancement throughout the epidural space of L3 through the sacrum concerning for possible phlegmon/abscess, L5-S1 marrow enhancement concerning for septic arthritis, and spinal stenosis L3-S1. He was started on broad-spectrum antibiotics.  Neurosurgery was consulted.  Assessment & Plan:   Principal Problem:   MRSA bacteremia Active Problems:   Cirrhosis, alcoholic ([facility])   Polysubstance abuse ([place])   Hyponatremia   Pancytopenia ([name])   Hepatic encephalopathy ([name])   Alcohol abuse   Septic arthritis ([name])   Anemia of chronic disease   Hypoalbuminemia   Hypoglycemia without diagnosis of diabetes mellitus   Epidural abscess   Abscess in epidural space of lumbar spine   Aspiration pneumonia ([name])   Septic arthritis/epidural abscess of lumbar spine -MRI of the lumbar spine revealed enhancement throughout the epidural space of L3 through the sacrum concerning for possible phlegmon/abscess, L5-S1 marrow enhancement concerning for septic arthritis, and spinal stenosis L3-S1 -Patient was initially started on broad-spectrum antibiotics vancomycin and Zosyn.  Patient has been subsequently switched to daptomycin per ID. -Neurosurgery has been consulted and had recommended IR consultation for probable drainage/biopsy.  As per IR, the abscess/collection was not large enough to aspirate or drain.  IR has notified Dr. Zada Finders. -Neurosurgery found patient to  be high risk for any surgical intervention and is recommended conservative management with IV antibiotics only. -No evidence of loss of bowel or bladder control for now -Continue pain management and supportive care. -Patient remains afebrile.  MRSA bacteremia -Susceptibilities pending.  ID has been consulted automatically.  Currently on daptomycin.   -Repeat blood cultures without any growth. -2D echo: Preserved ejection fraction, no wall motion normalities, no vegetations appreciated on any valve.  Mild regurgitation and he tricuspid valve was appreciated by color flow Doppler..  Acute kidney injury with metabolic acidosis -Baseline creatinine of 0.95 on 08/22/2018.  Patient on Lasix and spironolactone at home which is on hold. -Presented with creatinine of 1.82.  Creatinine has worsened at 2.4 -Continue IV fluids; rate adjusted -Creatinine down to 1.4 -Follow renal function trend -Continue strict input and output. -Renal ultrasound, demonstrated no acute obstructive uropathy.  Alcoholic cirrhosis with hepatic encephalopathy -Continue lactulose. -Diuretics remains on hold. -Continue beta-blocker; will use low-dose of IV Lopressor.  Polysubstance abuse -Patient admits to alcohol and recent heroin use prior to arrival -Continue CIWA protocol.  Social worker consult.  Continue multivitamin, thiamine and folate. -Patient is confused and lethargic this morning. -Ammonia level is low -Be careful with use of sedatives because of history of alcoholic cirrhosis.  Hyponatremia -Sodium 130 on admission. With a chronic component given cirrhosis.  -Continue gentle fluid resuscitation.  Hypoalbuminemia -Follow recommendations by nutritional service -We will use feeding supplements.  Generalized deconditioning -Overall prognosis is very poor.  Patient is noncompliant and actively using drugs.   -Will discuss regarding inpatient supervise therapy as he will require IV antibiotics for the  next 6 to 8 weeks minimum. -Palliative care has been consulted and will follow recommendations.  Shortness of breath/multifocal pneumonia -In  the setting of aspiration -Continue treatment with Unasyn.  Dysphagia -In the setting of acute encephalopathy -will ask speech therapy to evaluate.  DVT prophylaxis: SCDs  Code Status: Full Family Communication: None at bedside Disposition Plan: Patient not a candidate for IV antibiotic therapy given prior history of substance abuse.  Will require 6 to 8 weeks of IV antibiotics minimum.  Consultants:  Neurosurgery ID consulted automatically because of positive blood cultures Palliative care  Procedures:  See below for x-ray reports 2D echo: IMPRESSIONS 1. The left ventricle has normal systolic function with an ejection fraction of 60-65%. The cavity size was normal. There is mildly increased left ventricular wall thickness. Left ventricular diastolic parameters were normal.  2. The right ventricle has normal systolic function. The cavity was normal. There is no increase in right ventricular wall thickness.  3. Left atrial size was severely dilated.  4. No evidence of mitral valve stenosis.  5. Mild thickening of the aortic valve. Mild calcification of the aortic valve. No stenosis of the aortic valve.  Antimicrobials: 1 dose of vancomycin Zosyn and Ancef on 09/20/2018 -Daptomycin from 09/21/2018 onwards   Subjective: Afebrile currently.  Increase shortness of breath, still having pain in his back.  Patient confused and even able to follow simple commands, unsafe to protect airways or safely swallow this time.  Overnight with episode of vomiting and concern for aspiration.  X-ray of the chest demonstrating multifocal airspace opacities, primarily at the lung bases; concerning for multifocal pneumonia or aspiration.  Objective: Vitals:   09/24/18 0459 09/24/18 0500 09/24/18 0730 09/24/18 1219  BP:   (!) 149/79 (!) 148/53  Pulse:   67    Resp: (!) 21 (!) 23 (!) 22 20  Temp:  98.6 F (37 C) 98.7 F (37.1 C) 98.5 F (36.9 C)  TempSrc:   Axillary Axillary  SpO2:   100% 100%  Weight:      Height:        Intake/Output Summary (Last 24 hours) at 09/24/2018 1547 Last data filed at 09/24/2018 1400 Gross per 24 hour  Intake 2670.64 ml  Output -  Net 2670.64 ml   Filed Weights   09/19/18 2011 09/20/18 1055  Weight: 61.2 kg 64.7 kg    Examination: General exam: More alert this morning, but agitated and restless; no fever.  Increased difficulty breathing.  Following simple commands, but with concerns for ability to protect airways and swallow safely. Respiratory system: Increased respiratory rate, positive rhonchi, mild use of accessory muscles.   Cardiovascular system: RRR. No murmurs, rubs, gallops. Gastrointestinal system: Abdomen is slightly distended, soft and nontender. No organomegaly or masses felt. Normal bowel sounds heard. Central nervous system: Alert and oriented. No focal neurological deficits. Extremities: No cyanosis or clubbing.  Trace edema bilaterally. Skin: No rashes, lesions or ulcers Psychiatry: Judgement and insight appear impaired due to current AMS. Intermittent agitation.  Data Reviewed: I have personally reviewed following labs and imaging studies  CBC: Recent Labs  Lab 09/20/18 0726 09/21/18 0546 09/22/18 0427 09/24/18 0213  WBC 4.9 7.9 11.3* 13.8*  NEUTROABS 4.1  --  9.4*  --   HGB 9.1* 8.2* 8.2* 8.2*  HCT 28.2* 24.3* 24.3* 25.3*  MCV 98.3 94.6 92.7 97.7  PLT PLATELETS APPEAR DECREASED 53* 56* 64*   Basic Metabolic Panel: Recent Labs  Lab 09/20/18 0726 09/21/18 0546 09/22/18 0427 09/23/18 1757 09/24/18 0213  NA 130* 129* 133* 138 142  K 4.2 4.1 3.9 3.8 3.6  CL 101 103 106 115*  115*  CO2 18* 16* 18* 16* 18*  GLUCOSE 56* 99 93 90 113*  BUN 26* 37* 40* 36* 36*  CREATININE 1.82* 2.40* 2.18* 1.52* 1.41*  CALCIUM 8.1* 7.4* 7.4* 7.6* 7.7*  MG  --   --  1.9  --   --    GFR:  Estimated Creatinine Clearance: 45.4 mL/min (A) (by C-G formula based on SCr of 1.41 mg/dL (H)).   Liver Function Tests: Recent Labs  Lab 09/20/18 0726 09/21/18 0546 09/22/18 0427 09/23/18 1757 09/24/18 0213  AST 34 30 27 27 31   ALT 15 12 11 10 11   ALKPHOS 120 94 103 134* 152*  BILITOT 1.8* 1.6* 1.0 1.3* 1.1  PROT 6.0* 5.2* 4.9* 5.3* 5.6*  ALBUMIN 1.7* 1.3* 1.2* 1.4* 1.4*    Recent Labs  Lab 09/20/18 0930 09/21/18 0546 09/23/18 1757 09/24/18 0214  AMMONIA 111* 114* 11 17   Cardiac Enzymes: Recent Labs  Lab 09/22/18 0427  CKTOTAL 12*   CBG: Recent Labs  Lab 09/23/18 1838 09/23/18 2109 09/24/18 0110 09/24/18 0444 09/24/18 0808  GLUCAP 121* 90 99 106* 112*   Sepsis Labs: Recent Labs  Lab 09/20/18 0727 09/20/18 0905 09/20/18 1157  LATICACIDVEN 2.1* 2.8* 2.6*    Recent Results (from the past 240 hour(s))  Culture, blood (routine x 2)     Status: Abnormal   Collection Time: 09/20/18  7:25 AM  Result Value Ref Range Status   Specimen Description BLOOD RIGHT ANTECUBITAL  Final   Special Requests   Final    BOTTLES DRAWN AEROBIC AND ANAEROBIC Blood Culture results may not be optimal due to an excessive volume of blood received in culture bottles   Culture  Setup Time   Final    IN BOTH AEROBIC AND ANAEROBIC BOTTLES GRAM POSITIVE COCCI CRITICAL RESULT CALLED TO, READ BACK BY AND VERIFIED WITH: G. ABBOTT,PHARMD 0226 09/21/2018 T. TYSOR Performed at [facility], [place] [address], [place], [place] [address]    Culture METHICILLIN RESISTANT STAPHYLOCOCCUS AUREUS (A)  Final   Report Status 09/22/2018 FINAL  Final   Organism ID, Bacteria METHICILLIN RESISTANT STAPHYLOCOCCUS AUREUS  Final      Susceptibility   Methicillin resistant staphylococcus aureus - MIC*    CIPROFLOXACIN <=0.5 SENSITIVE Sensitive     ERYTHROMYCIN >=8 RESISTANT Resistant     GENTAMICIN <=0.5 SENSITIVE Sensitive     OXACILLIN RESISTANT Resistant     TETRACYCLINE <=1 SENSITIVE  Sensitive     VANCOMYCIN 1 SENSITIVE Sensitive     TRIMETH/SULFA <=10 SENSITIVE Sensitive     CLINDAMYCIN <=0.25 SENSITIVE Sensitive     RIFAMPIN <=0.5 SENSITIVE Sensitive     Inducible Clindamycin NEGATIVE Sensitive     * METHICILLIN RESISTANT STAPHYLOCOCCUS AUREUS  Blood Culture ID Panel (Reflexed)     Status: Abnormal   Collection Time: 09/20/18  7:25 AM  Result Value Ref Range Status   Enterococcus species NOT DETECTED NOT DETECTED Final   Listeria monocytogenes NOT DETECTED NOT DETECTED Final   Staphylococcus species DETECTED (A) NOT DETECTED Final    Comment: CRITICAL RESULT CALLED TO, READ BACK BY AND VERIFIED WITH: Nadean Corwin [id number] 09/21/2018 T. TYSOR    Staphylococcus aureus (BCID) DETECTED (A) NOT DETECTED Final    Comment: Methicillin (oxacillin)-resistant Staphylococcus aureus (MRSA). MRSA is predictably resistant to beta-lactam antibiotics (except ceftaroline). Preferred therapy is vancomycin unless clinically contraindicated. Patient requires contact precautions if  hospitalized. CRITICAL RESULT CALLED TO, READ BACK BY AND VERIFIED WITH: G. ABBOTT,PHARMD 0226 09/21/2018 T.  TYSOR    Methicillin resistance DETECTED (A) NOT DETECTED Final    Comment: CRITICAL RESULT CALLED TO, READ BACK BY AND VERIFIED WITH: G. ABBOTT,PHARMD 0226 09/21/2018 T. TYSOR    Streptococcus species NOT DETECTED NOT DETECTED Final   Streptococcus agalactiae NOT DETECTED NOT DETECTED Final   Streptococcus pneumoniae NOT DETECTED NOT DETECTED Final   Streptococcus pyogenes NOT DETECTED NOT DETECTED Final   Acinetobacter baumannii NOT DETECTED NOT DETECTED Final   Enterobacteriaceae species NOT DETECTED NOT DETECTED Final   Enterobacter cloacae complex NOT DETECTED NOT DETECTED Final   Escherichia coli NOT DETECTED NOT DETECTED Final   Klebsiella oxytoca NOT DETECTED NOT DETECTED Final   Klebsiella pneumoniae NOT DETECTED NOT DETECTED Final   Proteus species NOT DETECTED NOT DETECTED Final    Serratia marcescens NOT DETECTED NOT DETECTED Final   Haemophilus influenzae NOT DETECTED NOT DETECTED Final   Neisseria meningitidis NOT DETECTED NOT DETECTED Final   Pseudomonas aeruginosa NOT DETECTED NOT DETECTED Final   Candida albicans NOT DETECTED NOT DETECTED Final   Candida glabrata NOT DETECTED NOT DETECTED Final   Candida krusei NOT DETECTED NOT DETECTED Final   Candida parapsilosis NOT DETECTED NOT DETECTED Final   Candida tropicalis NOT DETECTED NOT DETECTED Final    Comment: Performed at [facility], [place]. [address] [place], [place] [address]  Culture, blood (routine x 2)     Status: Abnormal   Collection Time: 09/20/18  7:34 AM  Result Value Ref Range Status   Specimen Description BLOOD LEFT ANTECUBITAL  Final   Special Requests   Final    BOTTLES DRAWN AEROBIC AND ANAEROBIC Blood Culture adequate volume   Culture  Setup Time   Final    IN BOTH AEROBIC AND ANAEROBIC BOTTLES GRAM POSITIVE COCCI CRITICAL RESULT CALLED TO, READ BACK BY AND VERIFIED WITH: G. ABBOTT,PHARMD 0226 09/21/2018 T. TYSOR    Culture (A)  Final    STAPHYLOCOCCUS AUREUS SUSCEPTIBILITIES PERFORMED ON PREVIOUS CULTURE WITHIN THE LAST 5 DAYS. Performed at [facility], [place] [address] [place], [place] [address]    Report Status 09/22/2018 FINAL  Final  SARS Coronavirus 2 (CEPHEID - Performed in [facility] [place] hospital lab), Hosp Order     Status: None   Collection Time: 09/20/18  7:34 AM  Result Value Ref Range Status   SARS Coronavirus 2 NEGATIVE NEGATIVE Final    Comment: (NOTE) If result is NEGATIVE SARS-CoV-2 target nucleic acids are NOT DETECTED. The SARS-CoV-2 RNA is generally detectable in upper and lower  respiratory specimens during the acute phase of infection. The lowest  concentration of SARS-CoV-2 viral copies this assay can detect is 250  copies / mL. A negative result does not preclude SARS-CoV-2 infection  and should not be used as the sole basis for treatment or other   patient management decisions.  A negative result may occur with  improper specimen collection / handling, submission of specimen other  than nasopharyngeal swab, presence of viral mutation(s) within the  areas targeted by this assay, and inadequate number of viral copies  (<250 copies / mL). A negative result must be combined with clinical  observations, patient history, and epidemiological information. If result is POSITIVE SARS-CoV-2 target nucleic acids are DETECTED. The SARS-CoV-2 RNA is generally detectable in upper and lower  respiratory specimens dur ing the acute phase of infection.  Positive  results are indicative of active infection with SARS-CoV-2.  Clinical  correlation with patient history and other diagnostic information is  necessary to determine patient infection status.  Positive results do  not rule out bacterial infection or co-infection with other viruses. If result is PRESUMPTIVE POSTIVE SARS-CoV-2 nucleic acids MAY BE PRESENT.   A presumptive positive result was obtained on the submitted specimen  and confirmed on repeat testing.  While 2019 novel coronavirus  (SARS-CoV-2) nucleic acids may be present in the submitted sample  additional confirmatory testing may be necessary for epidemiological  and / or clinical management purposes  to differentiate between  SARS-CoV-2 and other Sarbecovirus currently known to infect humans.  If clinically indicated additional testing with an alternate test  methodology [phone]) is advised. The SARS-CoV-2 RNA is generally  detectable in upper and lower respiratory sp ecimens during the acute  phase of infection. The expected result is Negative. Fact Sheet for Patients:  StrictlyIdeas.no Fact Sheet for Healthcare Providers: BankingDealers.co.za This test is not yet approved or cleared by the [place] FDA and has been authorized for detection and/or diagnosis of SARS-CoV-2 by  FDA under an Emergency Use Authorization (EUA).  This EUA will remain in effect (meaning this test can be used) for the duration of the COVID-19 declaration under Section 564(b)(1) of the Act, 21 U.S.C. section 360bbb-3(b)(1), unless the authorization is terminated or revoked sooner. Performed at [facility], [place] [address] [place], [place] [address]   Culture, blood (routine x 2)     Status: None (Preliminary result)   Collection Time: 09/21/18 12:23 PM  Result Value Ref Range Status   Specimen Description BLOOD LEFT ANTECUBITAL  Final   Special Requests   Final    BOTTLES DRAWN AEROBIC ONLY Blood Culture results may not be optimal due to an inadequate volume of blood received in culture bottles   Culture   Final    NO GROWTH 3 DAYS Performed at [facility], [place] [address], [place], [place] [address]    Report Status PENDING  Incomplete  Culture, blood (routine x 2)     Status: None (Preliminary result)   Collection Time: 09/21/18  3:45 PM  Result Value Ref Range Status   Specimen Description BLOOD RIGHT HAND  Final   Special Requests   Final    BOTTLES DRAWN AEROBIC ONLY Blood Culture results may not be optimal due to an inadequate volume of blood received in culture bottles   Culture   Final    NO GROWTH 3 DAYS Performed at [facility], [place] [address], [place],  [address]    Report Status PENDING  Incomplete     Radiology Studies: Dg Chest Port 1 View  Result Date: 09/23/2018 CLINICAL DATA:  Tachypnea EXAM: PORTABLE CHEST 1 VIEW COMPARISON:  05/18/2018 FINDINGS: There are multifocal airspace opacities greatest at the right lung base. The heart size is mildly enlarged. There is no pneumothorax or large pleural effusion. There is some mild elevation of the right hemidiaphragm. There is no acute osseous abnormality. IMPRESSION: Multifocal airspace opacities, primarily at the lung bases, concerning for multifocal pneumonia or aspiration. Electronically  Signed   By: Constance Holster M.D.   On: 09/23/2018 21:44    Scheduled Meds: . cloNIDine  0.2 mg Transdermal Weekly  . diazepam  2.5 mg Oral BID  . feeding supplement (ENSURE ENLIVE)  237 mL Oral TID BM  . folic acid  1 mg Oral Daily  . lactulose  30 g Oral TID  .  morphine injection  1 mg Intravenous Q6H  . multivitamin with minerals  1 tablet Oral Daily  . nadolol  20 mg Oral Daily  . thiamine  100 mg Oral Daily   Continuous Infusions: . ampicillin-sulbactam (UNASYN) IV 3 g (09/24/18 1333)  . DAPTOmycin (CUBICIN)  IV    . dextrose 5 % and 0.9% NaCl 1,000 mL (09/24/18 1219)     LOS: 4 days    Barton Dubois, MD [phone]  [facility] 09/24/2018, 3:47 PM

## 2018-09-24 NOTE — Progress Notes (Addendum)
I attempted to contact multiple family members, I finally was able to get his sister Mark [name] on the phone who was able to provide me with information. She understands how sick he is and I have updated her on his condition. We need to determine who is the legal decision maker for him-he has a wife "Mark [name]" and a daughter "Mark [name]". I am concerned that he is mostly unresponsive, garbled speech and is not following commands-he is not presenting like typical hepatic encephalopathy. This is HOD #4 he may be having withdrawal from ETOH and opioids. Because of his cirrhosis he will be benzodiazapine sensitive and they will cause significant sedation. He may also be more prone to withdrawal seizures because of prior strokes.  I explained to his sister that he is critically ill and does not have a reversible condition at this point and may not even survive to discharge- I explained the urgency of needing to make some critical decisions on his behalf about code status etc. I do not believe he would benefit from intubation or CPR given his advanced cirrhosis, ongoing drug and ETOH abuse and now probable epidural abscess.  The sister is going to try to get a hold of his wife and daughter and let me know when I can call them and conference call with them.  His condition appears to be deteriorating rapidly.  I suspect withdrawal may be occurring since he has not had benzos  or opioid in the last 24 hours according to the [facility]  Problems: 1. Advanced Liver Disease 2. Active ETOH and Heroin Abuse 3. Withdrawal syndrome 4. Epidural Abscesses   Symptom Management:   Would give him a very small dose of diazepam IV   Start Clonidine(catapres patch) for withdrawal symptoms   Administer a small amount of IV opioid  Give Compazine for nausea and vomiting that could be related to withdrawal.  Hopefull we can further clarify his goals of care quickly. I would support two physician DNR if family does not get back in  touch with [place] and he has an acute or sudden decline.  [name], DO Palliative Medicine [phone]  70 min Greater than 50%  of this time was spent counseling and coordinating care related to the above assessment and plan.

## 2018-09-24 NOTE — Progress Notes (Signed)
Palliative Medicine RN Note: Rec'd a call from unit charge RN; pt's daughter Eben Burow is here. Dr Hilma Favors is not in the building to meet with them and has not authorized a visit; visiting would be determined, at this time, by unit leadership.  Per Dr Delanna Ahmadi request, I called his daughter [phone]) and left a message requesting she call our team line [phone]) to let us know what time Hassell's wife and daughter would be available by phone to talk to Dr Hilma Favors tomorrow, 09/25/18.  Marjie Skiff Conroy Goracke, RN, BSN, [facility] Palliative Medicine Team 09/24/2018 3:45 PM Office [phone]

## 2018-09-25 ENCOUNTER — Inpatient Hospital Stay: Payer: Self-pay

## 2018-09-25 DIAGNOSIS — G062 Extradural and subdural abscess, unspecified: Secondary | ICD-10-CM

## 2018-09-25 DIAGNOSIS — G9340 Encephalopathy, unspecified: Secondary | ICD-10-CM

## 2018-09-25 DIAGNOSIS — M0008 Staphylococcal arthritis, vertebrae: Secondary | ICD-10-CM

## 2018-09-25 DIAGNOSIS — J690 Pneumonitis due to inhalation of food and vomit: Secondary | ICD-10-CM

## 2018-09-25 LAB — BASIC METABOLIC PANEL
Anion gap: 9 (ref 5–15)
BUN: 27 mg/dL — ABNORMAL HIGH (ref 8–23)
CO2: 18 mmol/L — ABNORMAL LOW (ref 22–32)
Calcium: 7.6 mg/dL — ABNORMAL LOW (ref 8.9–10.3)
Chloride: 117 mmol/L — ABNORMAL HIGH (ref 98–111)
Creatinine, Ser: 1.17 mg/dL (ref 0.61–1.24)
GFR calc Af Amer: >60 mL/min (ref >60)
GFR calc non Af Amer: >60 mL/min (ref >60)
Glucose, Bld: 130 mg/dL — ABNORMAL HIGH (ref 70–99)
Potassium: 3.4 mmol/L — ABNORMAL LOW (ref 3.5–5.1)
Sodium: 144 mmol/L (ref 135–145)

## 2018-09-25 LAB — CBC
HCT: 22.9 % — ABNORMAL LOW (ref 39.0–52.0)
Hemoglobin: 7.6 g/dL — ABNORMAL LOW (ref 13.0–17.0)
MCH: 31.4 pg (ref 26.0–34.0)
MCHC: 33.2 g/dL (ref 30.0–36.0)
MCV: 94.6 fL (ref 80.0–100.0)
Platelets: 61 10*3/uL — ABNORMAL LOW (ref 150–400)
RBC: 2.42 MIL/uL — ABNORMAL LOW (ref 4.22–5.81)
RDW: 18.5 % — ABNORMAL HIGH (ref 11.5–15.5)
WBC: 11.8 10*3/uL — ABNORMAL HIGH (ref 4.0–10.5)
nRBC: 0 % (ref 0.0–0.2)

## 2018-09-25 LAB — GLUCOSE, CAPILLARY
Glucose-Capillary: 113 mg/dL — ABNORMAL HIGH (ref 70–99)
Glucose-Capillary: 99 mg/dL (ref 70–99)

## 2018-09-25 MED ORDER — METOPROLOL TARTRATE 5 MG/5ML IV SOLN: 5.0000 mg | Freq: Three times a day (TID) | INTRAVENOUS | Status: DC

## 2018-09-25 MED ORDER — HYDRALAZINE HCL 20 MG/ML IJ SOLN
10.0000 mg | Freq: Four times a day (QID) | INTRAMUSCULAR | Status: DC | PRN
  Administered 2018-09-25: 10 mg via INTRAVENOUS
  Filled 2018-09-25: qty 1

## 2018-09-25 MED ORDER — RESOURCE THICKENUP CLEAR PO POWD
ORAL | Status: DC | PRN
  Filled 2018-09-25: qty 125

## 2018-09-25 MED ORDER — NADOLOL 20 MG PO TABS
20.0000 mg | ORAL_TABLET | Freq: Every day | ORAL | Status: DC
  Administered 2018-09-25 – 2018-10-04 (×10): 20 mg via ORAL
  Filled 2018-09-25 (×13): qty 1

## 2018-09-25 NOTE — Plan of Care (Addendum)
Patient alert oriented to self and knows year "2020". Follows simple commands. Hallucinating yelling at "frank".  Patient was placed on bedside commode 2 assist large void and bm noted. Very weak in BLE unable to take steps. Tolerated lactulose and oral meds without difficulty. Drank 120 of nectar thick fluid. Awaiting PICC line or central assess to be attempted since patient was not a candidate for piv/midline access.   Family updated on plan to speak with palliative 10am on Sunday, May 31st. Conference code given to family member.  Problem: Education: Goal: Knowledge of General Education information will improve Description Including pain rating scale, medication(s)/side effects and non-pharmacologic comfort measures 09/25/2018 1849 by Verne Grain, RN Outcome: Progressing 09/25/2018 1201 by Verne Grain, RN Outcome: Progressing   Problem: Health Behavior/Discharge Planning: Goal: Ability to manage health-related needs will improve 09/25/2018 1849 by Verne Grain, RN Outcome: Progressing 09/25/2018 1201 by Verne Grain, RN Outcome: Progressing   Problem: Nutrition: Goal: Adequate nutrition will be maintained 09/25/2018 1849 by Verne Grain, RN Outcome: Progressing 09/25/2018 1201 by Verne Grain, RN Outcome: Progressing   Problem: Elimination: Goal: Will not experience complications related to bowel motility 09/25/2018 1849 by Verne Grain, RN Outcome: Progressing 09/25/2018 1201 by Verne Grain, RN Outcome: Progressing Goal: Will not experience complications related to urinary retention 09/25/2018 1849 by Verne Grain, RN Outcome: Progressing 09/25/2018 1201 by Verne Grain, RN Outcome: Progressing   Problem: Pain Managment: Goal: General experience of comfort will improve 09/25/2018 1849 by Verne Grain, RN Outcome: Progressing 09/25/2018 1201 by Verne Grain, RN Outcome: Progressing   Problem: Safety: Goal: Ability to remain free from injury  will improve 09/25/2018 1849 by Verne Grain, RN Outcome: Progressing 09/25/2018 1201 by Verne Grain, RN Outcome: Progressing   Problem: Metabolic: Goal: Ability to maintain appropriate glucose levels will improve 09/25/2018 1849 by Verne Grain, RN Outcome: Progressing 09/25/2018 1201 by Verne Grain, RN Outcome: Progressing   Problem: Nutritional: Goal: Maintenance of adequate nutrition will improve 09/25/2018 1849 by Verne Grain, RN Outcome: Progressing 09/25/2018 1201 by Verne Grain, RN Outcome: Progressing Goal: Progress toward achieving an optimal weight will improve 09/25/2018 1849 by Verne Grain, RN Outcome: Progressing 09/25/2018 1201 by Verne Grain, RN Outcome: Progressing

## 2018-09-25 NOTE — Progress Notes (Signed)
INFECTIOUS DISEASE PROGRESS NOTE  ID: Mark [name] is a 63 y.o. male with  Principal Problem:   MRSA bacteremia Active Problems:   Cirrhosis, alcoholic (Mark [name])   Polysubstance abuse (Mark [name])   Hyponatremia   Pancytopenia (Mark [name])   Hepatic encephalopathy (Mark [name])   Alcohol abuse   Septic arthritis (Mark [name])   Anemia of chronic disease   Hypoalbuminemia   Hypoglycemia without diagnosis of diabetes mellitus   Epidural abscess   Abscess in epidural space of lumbar spine   Aspiration pneumonia (Mark [name])  Subjective: Continued confusion. More awake.   Abtx:  Anti-infectives (From admission, onward)   Start     Dose/Rate Route Frequency Ordered Stop   09/24/18 2000  DAPTOmycin (CUBICIN) 500 mg in sodium chloride 0.9 % IVPB     500 mg 220 mL/hr over 30 Minutes Intravenous Daily 09/23/18 2233     09/24/18 1330  Ampicillin-Sulbactam (UNASYN) 3 g in sodium chloride 0.9 % 100 mL IVPB     3 g 200 mL/hr over 30 Minutes Intravenous Every 8 hours 09/24/18 1225     09/24/18 0600  piperacillin-tazobactam (ZOSYN) IVPB 3.375 g  Status:  Discontinued     3.375 g 12.5 mL/hr over 240 Minutes Intravenous Every 8 hours 09/23/18 2233 09/24/18 1135   09/23/18 2300  piperacillin-tazobactam (ZOSYN) IVPB 3.375 g     3.375 g 100 mL/hr over 30 Minutes Intravenous  Once 09/23/18 2233 09/24/18 0318   09/21/18 1000  vancomycin (VANCOCIN) IVPB 750 mg/150 ml premix  Status:  Discontinued     750 mg 150 mL/hr over 60 Minutes Intravenous Every 24 hours 09/20/18 0949 09/21/18 0233   09/21/18 1000  DAPTOmycin (CUBICIN) 500 mg in sodium chloride 0.9 % IVPB  Status:  Discontinued     500 mg 220 mL/hr over 30 Minutes Intravenous Every 48 hours 09/21/18 0903 09/23/18 2233   09/21/18 0400  ceFAZolin (ANCEF) IVPB 2g/100 mL premix  Status:  Discontinued     2 g 200 mL/hr over 30 Minutes Intravenous Every 8 hours 09/21/18 0233 09/21/18 0900   09/20/18 1800  piperacillin-tazobactam (ZOSYN) IVPB 3.375 g  Status:  Discontinued      3.375 g 12.5 mL/hr over 240 Minutes Intravenous Every 8 hours 09/20/18 0929 09/20/18 0935   09/20/18 1500  piperacillin-tazobactam (ZOSYN) IVPB 3.375 g  Status:  Discontinued     3.375 g 12.5 mL/hr over 240 Minutes Intravenous Every 8 hours 09/20/18 0935 09/21/18 0233   09/20/18 1000  piperacillin-tazobactam (ZOSYN) IVPB 3.375 g  Status:  Discontinued     3.375 g 100 mL/hr over 30 Minutes Intravenous  Once 09/20/18 0928 09/20/18 0934   09/20/18 1000  vancomycin (VANCOCIN) 1,250 mg in sodium chloride 0.9 % 250 mL IVPB  Status:  Discontinued     1,250 mg 166.7 mL/hr over 90 Minutes Intravenous  Once 09/20/18 0928 09/20/18 0934   09/20/18 1000  vancomycin (VANCOCIN) 500 mg in sodium chloride 0.9 % 100 mL IVPB     500 mg 100 mL/hr over 60 Minutes Intravenous  Once 09/20/18 0937 09/20/18 1407   09/20/18 0715  vancomycin (VANCOCIN) IVPB 1000 mg/200 mL premix     1,000 mg 200 mL/hr over 60 Minutes Intravenous  Once 09/20/18 0706 09/20/18 0906   09/20/18 0715  piperacillin-tazobactam (ZOSYN) IVPB 3.375 g     3.375 g 100 mL/hr over 30 Minutes Intravenous  Once 09/20/18 0706 09/20/18 1008      Medications:  Scheduled: . cloNIDine  0.2 mg Transdermal Weekly  .  diazepam  2.5 mg Oral BID  . feeding supplement (ENSURE ENLIVE)  237 mL Oral TID BM  . folic acid  1 mg Oral Daily  . lactulose  30 g Oral TID  . metoprolol tartrate  5 mg Intravenous Q8H  .  morphine injection  1 mg Intravenous Q6H  . multivitamin with minerals  1 tablet Oral Daily  . thiamine  100 mg Oral Daily    Objective: Vital signs in last 24 hours: Temp:  [97.7 F (36.5 C)-98.5 F (36.9 C)] 98.2 F (36.8 C) (05/30 0514) Pulse Rate:  [60-64] 60 (05/30 0611) Resp:  [18-20] 18 (05/30 0514) BP: (148-175)/(53-86) 175/86 (05/30 0611) SpO2:  [100 %] 100 % (05/30 0514)   General appearance: alert, delirious and no distress Eyes: positive findings: conjunctiva: icterus Resp: clear to auscultation bilaterally Cardio:  regular rate and rhythm GI: normal findings: bowel sounds normal and soft, non-tender and abnormal findings:  distended Neurologic: Mental status: alertness: alert, orientation: place is Mark [name], month is Mark [name]  Lab Results Recent Labs    09/24/18 0213 09/25/18 0512  WBC 13.8* 11.8*  HGB 8.2* 7.6*  HCT 25.3* 22.9*  NA 142 144  K 3.6 3.4*  CL 115* 117*  CO2 18* 18*  BUN 36* 27*  CREATININE 1.41* 1.17   Liver Panel Recent Labs    09/23/18 1757 09/24/18 0213  PROT 5.3* 5.6*  ALBUMIN 1.4* 1.4*  AST 27 31  ALT 10 11  ALKPHOS 134* 152*  BILITOT 1.3* 1.1   Sedimentation Rate No results for input(s): ESRSEDRATE in the last 72 hours. C-Reactive Protein No results for input(s): CRP in the last 72 hours.  Microbiology: Recent Results (from the past 240 hour(s))  Culture, blood (routine x 2)     Status: Abnormal   Collection Time: 09/20/18  7:25 AM  Result Value Ref Range Status   Specimen Description BLOOD RIGHT ANTECUBITAL  Final   Special Requests   Final    BOTTLES DRAWN AEROBIC AND ANAEROBIC Blood Culture results may not be optimal due to an excessive volume of blood received in culture bottles   Culture  Setup Time   Final    IN BOTH AEROBIC AND ANAEROBIC BOTTLES GRAM POSITIVE COCCI CRITICAL RESULT CALLED TO, READ BACK BY AND VERIFIED WITH: Mark [name] [id number] 09/21/2018 Mark [name] Performed at [name], [place] [address] [place], Mark [name] [address]    Culture METHICILLIN RESISTANT STAPHYLOCOCCUS AUREUS (A)  Final   Report Status 09/22/2018 FINAL  Final   Organism ID, Bacteria METHICILLIN RESISTANT STAPHYLOCOCCUS AUREUS  Final      Susceptibility   Methicillin resistant staphylococcus aureus - MIC*    CIPROFLOXACIN <=0.5 SENSITIVE Sensitive     ERYTHROMYCIN >=8 RESISTANT Resistant     GENTAMICIN <=0.5 SENSITIVE Sensitive     OXACILLIN RESISTANT Resistant     TETRACYCLINE <=1 SENSITIVE Sensitive     VANCOMYCIN 1 SENSITIVE Sensitive     TRIMETH/SULFA  <=10 SENSITIVE Sensitive     CLINDAMYCIN <=0.25 SENSITIVE Sensitive     RIFAMPIN <=0.5 SENSITIVE Sensitive     Inducible Clindamycin NEGATIVE Sensitive     * METHICILLIN RESISTANT STAPHYLOCOCCUS AUREUS  Blood Culture ID Panel (Reflexed)     Status: Abnormal   Collection Time: 09/20/18  7:25 AM  Result Value Ref Range Status   Enterococcus species NOT DETECTED NOT DETECTED Final   Listeria monocytogenes NOT DETECTED NOT DETECTED Final   Staphylococcus species DETECTED (A) NOT DETECTED Final  Comment: CRITICAL RESULT CALLED TO, READ BACK BY AND VERIFIED WITH: Nadean Corwin [id number] 09/21/2018 T. TYSOR    Staphylococcus aureus (BCID) DETECTED (A) NOT DETECTED Final    Comment: Methicillin (oxacillin)-resistant Staphylococcus aureus (MRSA). MRSA is predictably resistant to beta-lactam antibiotics (except ceftaroline). Preferred therapy is vancomycin unless clinically contraindicated. Patient requires contact precautions if  hospitalized. CRITICAL RESULT CALLED TO, READ BACK BY AND VERIFIED WITH: G. ABBOTT,PHARMD 0226 09/21/2018 T. TYSOR    Methicillin resistance DETECTED (A) NOT DETECTED Final    Comment: CRITICAL RESULT CALLED TO, READ BACK BY AND VERIFIED WITH: G. ABBOTT,PHARMD 0226 09/21/2018 T. TYSOR    Streptococcus species NOT DETECTED NOT DETECTED Final   Streptococcus agalactiae NOT DETECTED NOT DETECTED Final   Streptococcus pneumoniae NOT DETECTED NOT DETECTED Final   Streptococcus pyogenes NOT DETECTED NOT DETECTED Final   Acinetobacter baumannii NOT DETECTED NOT DETECTED Final   Enterobacteriaceae species NOT DETECTED NOT DETECTED Final   Enterobacter cloacae complex NOT DETECTED NOT DETECTED Final   Escherichia coli NOT DETECTED NOT DETECTED Final   Klebsiella oxytoca NOT DETECTED NOT DETECTED Final   Klebsiella pneumoniae NOT DETECTED NOT DETECTED Final   Proteus species NOT DETECTED NOT DETECTED Final   Serratia marcescens NOT DETECTED NOT DETECTED Final   Haemophilus  influenzae NOT DETECTED NOT DETECTED Final   Neisseria meningitidis NOT DETECTED NOT DETECTED Final   Pseudomonas aeruginosa NOT DETECTED NOT DETECTED Final   Candida albicans NOT DETECTED NOT DETECTED Final   Candida glabrata NOT DETECTED NOT DETECTED Final   Candida krusei NOT DETECTED NOT DETECTED Final   Candida parapsilosis NOT DETECTED NOT DETECTED Final   Candida tropicalis NOT DETECTED NOT DETECTED Final    Comment: Performed at [name], [place]. [address], [place], [place] [address]  Culture, blood (routine x 2)     Status: Abnormal   Collection Time: 09/20/18  7:34 AM  Result Value Ref Range Status   Specimen Description BLOOD LEFT ANTECUBITAL  Final   Special Requests   Final    BOTTLES DRAWN AEROBIC AND ANAEROBIC Blood Culture adequate volume   Culture  Setup Time   Final    IN BOTH AEROBIC AND ANAEROBIC BOTTLES GRAM POSITIVE COCCI CRITICAL RESULT CALLED TO, READ BACK BY AND VERIFIED WITH: G. ABBOTT,PHARMD 0226 09/21/2018 T. TYSOR    Culture (A)  Final    STAPHYLOCOCCUS AUREUS SUSCEPTIBILITIES PERFORMED ON PREVIOUS CULTURE WITHIN THE LAST 5 DAYS. Performed at [name], [place] [address] [place], [place] [address]    Report Status 09/22/2018 FINAL  Final  SARS Coronavirus 2 (CEPHEID - Performed in [name]), Hosp Order     Status: None   Collection Time: 09/20/18  7:34 AM  Result Value Ref Range Status   SARS Coronavirus 2 NEGATIVE NEGATIVE Final    Comment: (NOTE) If result is NEGATIVE SARS-CoV-2 target nucleic acids are NOT DETECTED. The SARS-CoV-2 RNA is generally detectable in upper and lower  respiratory specimens during the acute phase of infection. The lowest  concentration of SARS-CoV-2 viral copies this assay can detect is 250  copies / mL. A negative result does not preclude SARS-CoV-2 infection  and should not be used as the sole basis for treatment or other  patient management decisions.  A negative result may occur with   improper specimen collection / handling, submission of specimen other  than nasopharyngeal swab, presence of viral mutation(s) within the  areas targeted by this assay, and inadequate number of  viral copies  (<250 copies / mL). A negative result must be combined with clinical  observations, patient history, and epidemiological information. If result is POSITIVE SARS-CoV-2 target nucleic acids are DETECTED. The SARS-CoV-2 RNA is generally detectable in upper and lower  respiratory specimens dur ing the acute phase of infection.  Positive  results are indicative of active infection with SARS-CoV-2.  Clinical  correlation with patient history and other diagnostic information is  necessary to determine patient infection status.  Positive results do  not rule out bacterial infection or co-infection with other viruses. If result is PRESUMPTIVE POSTIVE SARS-CoV-2 nucleic acids MAY BE PRESENT.   A presumptive positive result was obtained on the submitted specimen  and confirmed on repeat testing.  While 2019 novel coronavirus  (SARS-CoV-2) nucleic acids may be present in the submitted sample  additional confirmatory testing may be necessary for epidemiological  and / or clinical management purposes  to differentiate between  SARS-CoV-2 and other Sarbecovirus currently known to infect humans.  If clinically indicated additional testing with an alternate test  methodology [phone]) is advised. The SARS-CoV-2 RNA is generally  detectable in upper and lower respiratory sp ecimens during the acute  phase of infection. The expected result is Negative. Fact Sheet for Patients:  StrictlyIdeas.no Fact Sheet for Healthcare Providers: BankingDealers.co.za This test is not yet approved or cleared by the [place] FDA and has been authorized for detection and/or diagnosis of SARS-CoV-2 by FDA under an Emergency Use Authorization (EUA).  This EUA will  remain in effect (meaning this test can be used) for the duration of the COVID-19 declaration under Section 564(b)(1) of the Act, 21 U.S.C. section 360bbb-3(b)(1), unless the authorization is terminated or revoked sooner. Performed at [name], [place] [address], [place], [place] [address]   Culture, blood (routine x 2)     Status: None (Preliminary result)   Collection Time: 09/21/18 12:23 PM  Result Value Ref Range Status   Specimen Description BLOOD LEFT ANTECUBITAL  Final   Special Requests   Final    BOTTLES DRAWN AEROBIC ONLY Blood Culture results may not be optimal due to an inadequate volume of blood received in culture bottles   Culture   Final    NO GROWTH 3 DAYS Performed at [name], [place] [address], [place], [place] [address]    Report Status PENDING  Incomplete  Culture, blood (routine x 2)     Status: None (Preliminary result)   Collection Time: 09/21/18  3:45 PM  Result Value Ref Range Status   Specimen Description BLOOD RIGHT HAND  Final   Special Requests   Final    BOTTLES DRAWN AEROBIC ONLY Blood Culture results may not be optimal due to an inadequate volume of blood received in culture bottles   Culture   Final    NO GROWTH 3 DAYS Performed at [name], [place] [address] [place], [place] [address]    Report Status PENDING  Incomplete    Studies/Results: Dg Chest Port 1 View  Result Date: 09/23/2018 CLINICAL DATA:  Tachypnea EXAM: PORTABLE CHEST 1 VIEW COMPARISON:  05/18/2018 FINDINGS: There are multifocal airspace opacities greatest at the right lung base. The heart size is mildly enlarged. There is no pneumothorax or large pleural effusion. There is some mild elevation of the right hemidiaphragm. There is no acute osseous abnormality. IMPRESSION: Multifocal airspace opacities, primarily at the lung bases, concerning for multifocal pneumonia or aspiration. Electronically Signed   By:  Constance Holster M.D.   On: 09/23/2018 21:44      Assessment/Plan: MRSA bacteremia (5-25) Epidural abscess, septic arthritis L3-4 Aspiration pneumonia CKD3 Alcoholic cirrhosis Encephalopathy  Total days of antibiotics: 5 daptomycin, 2 unsayn  Repeat BCx 5-26 are ngtd His CK was 12 (5-27) Appreciate paliative care eval. Await family meeting.  request for lactulose noted from family. Will defer to primary team. His last ammonia was 17 (5-29) His mental status is better but he still has significant way to progress.  His Cr has normalized.  Available as needed.          Bobby Rumpf MD, FACP Infectious Diseases (pager) [phone] [email or web address] 09/25/2018, 10:29 AM  LOS: 5 days

## 2018-09-25 NOTE — Progress Notes (Signed)
anthoni geerts wife [phone] Daughter C. Crenshaw. [phone] Contact info for Palliative care consult.

## 2018-09-25 NOTE — Progress Notes (Signed)
FYI: Pt currently has no IV access. This was lost earlier today on day shift. IV team has assessed and he is scheduled to receive PICC line 09/26/2018 at 0700. Pharmacy has been made aware. Will continue to monitor patient as well as his blood pressure.

## 2018-09-25 NOTE — Progress Notes (Signed)
Patient ID: Mark [name], male   DOB: [birth year], 63 y.o.   MRN: [id number]  PROGRESS NOTE    Zadkiel Dragan  RDE:[id number] DOB: 1955/10/22 DOA: 09/19/2018 PCP: [name], MD   Brief Narrative:  63 year old male with history of alcoholic liver cirrhosis, pancytopenia, hypertension, coronary artery disease, polysubstance abuse (alcohol, heroin, marijuana), CKD stage III and MSSA bacteremia in 04/2018 presented with worsening lower back pain.  COVID-19 rapid testing was negative on admission.  MRI of the lumbar spine revealed enhancement throughout the epidural space of L3 through the sacrum concerning for possible phlegmon/abscess, L5-S1 marrow enhancement concerning for septic arthritis, and spinal stenosis L3-S1. He was started on broad-spectrum antibiotics.  Neurosurgery was consulted.  Assessment & Plan:   Principal Problem:   MRSA bacteremia Active Problems:   Cirrhosis, alcoholic ([place])   Polysubstance abuse ([place])   Hyponatremia   Pancytopenia ([name])   Hepatic encephalopathy ([name])   Alcohol abuse   Septic arthritis ([name])   Anemia of chronic disease   Hypoalbuminemia   Hypoglycemia without diagnosis of diabetes mellitus   Epidural abscess   Abscess in epidural space of lumbar spine   Aspiration pneumonia ([name])   Septic arthritis/epidural abscess of lumbar spine -MRI of the lumbar spine revealed enhancement throughout the epidural space of L3 through the sacrum concerning for possible phlegmon/abscess, L5-S1 marrow enhancement concerning for septic arthritis, and spinal stenosis L3-S1 -Patient was initially started on broad-spectrum antibiotics vancomycin and Zosyn.  Patient has been subsequently switched to daptomycin per ID. -Neurosurgery has been consulted and had recommended IR consultation for probable drainage/biopsy.  As per IR, the abscess/collection was not large enough to aspirate or drain.  IR has notified Dr. Zada Finders. -Neurosurgery found patient to  be high risk for any surgical intervention and is recommended conservative management with IV antibiotics only. -No evidence of loss of bowel or bladder control for now -Continue pain management and supportive care. -Patient remains afebrile.  MRSA bacteremia -Susceptibilities pending.  ID has been consulted automatically.  Currently on daptomycin.   -Repeat blood cultures without any growth. -2D echo: Preserved ejection fraction, no wall motion normalities, no vegetations appreciated on any valve.  Mild regurgitation and he tricuspid valve was appreciated by color flow Doppler..  Acute kidney injury with metabolic acidosis -Baseline creatinine of 0.95 on 08/22/2018.  Patient on Lasix and spironolactone at home which is on hold. -Presented with creatinine of 1.82.  Creatinine has worsened at 2.4 -Continue IV fluids; rate adjusted -Creatinine down to 1.17 -Follow renal function trend -Continue strict input and output. -Renal ultrasound, demonstrated no acute obstructive uropathy.  Alcoholic cirrhosis with hepatic encephalopathy -Continue lactulose. -Diuretics remains on hold. -Continue beta-blocker; will use low-dose of IV Lopressor.  Polysubstance abuse -Patient admits to alcohol and recent heroin use prior to arrival -Continue CIWA protocol.  Social worker consult.  Continue multivitamin, thiamine and folate. -Patient is confused and lethargic this morning. -Ammonia level is low -Be careful with use of sedatives because of history of alcoholic cirrhosis.  Hyponatremia -Sodium 130 on admission. With a chronic component given cirrhosis.  -Continue gentle fluid resuscitation and supportive care. -Na 144 currently  Hypoalbuminemia -Follow recommendations by nutritional service -Will use feeding supplements.  Generalized deconditioning -Overall prognosis is very poor.  Patient is noncompliant and still actively using drugs.   -Will discuss regarding inpatient supervise therapy as  he will require IV antibiotics for the next 6 to 8 weeks minimum. -Palliative care has been consulted and will follow recommendations.  Shortness of breath/multifocal pneumonia -In the setting of aspiration -Continue treatment with Unasyn.  Dysphagia -In the setting of acute encephalopathy -speech therapy has seen patient and recommended clear/nectar thick diet..  DVT prophylaxis: SCDs  Code Status: Full Family Communication: None at bedside Disposition Plan: Patient not a candidate for IV antibiotic therapy given prior history of substance abuse.  Will require 6 to 8 weeks of IV antibiotics minimum.  Consultants:  Neurosurgery ID consulted automatically because of positive blood cultures Palliative care  Procedures:  See below for x-ray reports 2D echo: IMPRESSIONS 1. The left ventricle has normal systolic function with an ejection fraction of 60-65%. The cavity size was normal. There is mildly increased left ventricular wall thickness. Left ventricular diastolic parameters were normal.  2. The right ventricle has normal systolic function. The cavity was normal. There is no increase in right ventricular wall thickness.  3. Left atrial size was severely dilated.  4. No evidence of mitral valve stenosis.  5. Mild thickening of the aortic valve. Mild calcification of the aortic valve. No stenosis of the aortic valve.  Antimicrobials: 1 dose of vancomycin Zosyn and Ancef on 09/20/2018 -Daptomycin from 09/21/2018 onwards   Subjective: No fever, sudden improvement in respiratory status appreciated; no further nausea vomiting.  Patient denies chest pain.  Good oxygen saturation on room air.  Objective: Vitals:   09/24/18 2118 09/24/18 2208 09/25/18 0514 09/25/18 0611  BP: (!) 153/84  (!) 173/84 (!) 175/86  Pulse: 64  60 60  Resp: 20  18   Temp:  97.7 F (36.5 C) 98.2 F (36.8 C)   TempSrc:  Axillary Oral   SpO2:   100%   Weight:      Height:        Intake/Output Summary  (Last 24 hours) at 09/25/2018 1531 Last data filed at 09/25/2018 1200 Gross per 24 hour  Intake 1588.4 ml  Output -  Net 1588.4 ml   Filed Weights   09/19/18 2011 09/20/18 1055  Weight: 61.2 kg 64.7 kg    Examination: General exam: More awake and alert this morning.  Continued to be confused and delirious.  With intermittent episode of restlessness and agitation.  Respiratory system: Scattered rhonchi, no crackles, no wheezing. Cardiovascular system:RRR. No murmurs, rubs, gallops. Gastrointestinal system: Abdomen is mildly distended, soft and nontender. No organomegaly or masses felt. Normal bowel sounds heard. Central nervous system: No focal neurological deficits. Extremities: No cyanosis or clubbing.  Trace edema bilaterally. Skin: No rashes, lesions or ulcers Psychiatry: Judgement and insight has remained impaired in the setting of altered mental status.   Data Reviewed: I have personally reviewed following labs and imaging studies  CBC: Recent Labs  Lab 09/20/18 0726 09/21/18 0546 09/22/18 0427 09/24/18 0213 09/25/18 0512  WBC 4.9 7.9 11.3* 13.8* 11.8*  NEUTROABS 4.1  --  9.4*  --   --   HGB 9.1* 8.2* 8.2* 8.2* 7.6*  HCT 28.2* 24.3* 24.3* 25.3* 22.9*  MCV 98.3 94.6 92.7 97.7 94.6  PLT PLATELETS APPEAR DECREASED 53* 56* 64* 61*   Basic Metabolic Panel: Recent Labs  Lab 09/21/18 0546 09/22/18 0427 09/23/18 1757 09/24/18 0213 09/25/18 0512  NA 129* 133* 138 142 144  K 4.1 3.9 3.8 3.6 3.4*  CL 103 106 115* 115* 117*  CO2 16* 18* 16* 18* 18*  GLUCOSE 99 93 90 113* 130*  BUN 37* 40* 36* 36* 27*  CREATININE 2.40* 2.18* 1.52* 1.41* 1.17  CALCIUM 7.4* 7.4* 7.6* 7.7* 7.6*  MG  --  1.9  --   --   --    GFR: Estimated Creatinine Clearance: 54.8 mL/min (by C-G formula based on SCr of 1.17 mg/dL).   Liver Function Tests: Recent Labs  Lab 09/20/18 0726 09/21/18 0546 09/22/18 0427 09/23/18 1757 09/24/18 0213  AST 34 30 27 27 31   ALT 15 12 11 10 11   ALKPHOS  120 94 103 134* 152*  BILITOT 1.8* 1.6* 1.0 1.3* 1.1  PROT 6.0* 5.2* 4.9* 5.3* 5.6*  ALBUMIN 1.7* 1.3* 1.2* 1.4* 1.4*    Recent Labs  Lab 09/20/18 0930 09/21/18 0546 09/23/18 1757 09/24/18 0214  AMMONIA 111* 114* 11 17   Cardiac Enzymes: Recent Labs  Lab 09/22/18 0427  CKTOTAL 12*   CBG: Recent Labs  Lab 09/23/18 2109 09/24/18 0110 09/24/18 0444 09/24/18 0808 09/25/18 1459  GLUCAP 90 99 106* 112* 113*   Sepsis Labs: Recent Labs  Lab 09/20/18 0727 09/20/18 0905 09/20/18 1157  LATICACIDVEN 2.1* 2.8* 2.6*    Recent Results (from the past 240 hour(s))  Culture, blood (routine x 2)     Status: Abnormal   Collection Time: 09/20/18  7:25 AM  Result Value Ref Range Status   Specimen Description BLOOD RIGHT ANTECUBITAL  Final   Special Requests   Final    BOTTLES DRAWN AEROBIC AND ANAEROBIC Blood Culture results may not be optimal due to an excessive volume of blood received in culture bottles   Culture  Setup Time   Final    IN BOTH AEROBIC AND ANAEROBIC BOTTLES GRAM POSITIVE COCCI CRITICAL RESULT CALLED TO, READ BACK BY AND VERIFIED WITH: G. ABBOTT,PHARMD 0226 09/21/2018 T. TYSOR Performed at [facility], [place]. [address], [place], [place] [address]    Culture METHICILLIN RESISTANT STAPHYLOCOCCUS AUREUS (A)  Final   Report Status 09/22/2018 FINAL  Final   Organism ID, Bacteria METHICILLIN RESISTANT STAPHYLOCOCCUS AUREUS  Final      Susceptibility   Methicillin resistant staphylococcus aureus - MIC*    CIPROFLOXACIN <=0.5 SENSITIVE Sensitive     ERYTHROMYCIN >=8 RESISTANT Resistant     GENTAMICIN <=0.5 SENSITIVE Sensitive     OXACILLIN RESISTANT Resistant     TETRACYCLINE <=1 SENSITIVE Sensitive     VANCOMYCIN 1 SENSITIVE Sensitive     TRIMETH/SULFA <=10 SENSITIVE Sensitive     CLINDAMYCIN <=0.25 SENSITIVE Sensitive     RIFAMPIN <=0.5 SENSITIVE Sensitive     Inducible Clindamycin NEGATIVE Sensitive     * METHICILLIN RESISTANT STAPHYLOCOCCUS AUREUS   Blood Culture ID Panel (Reflexed)     Status: Abnormal   Collection Time: 09/20/18  7:25 AM  Result Value Ref Range Status   Enterococcus species NOT DETECTED NOT DETECTED Final   Listeria monocytogenes NOT DETECTED NOT DETECTED Final   Staphylococcus species DETECTED (A) NOT DETECTED Final    Comment: CRITICAL RESULT CALLED TO, READ BACK BY AND VERIFIED WITH: Nadean Corwin [id number] 09/21/2018 T. TYSOR    Staphylococcus aureus (BCID) DETECTED (A) NOT DETECTED Final    Comment: Methicillin (oxacillin)-resistant Staphylococcus aureus (MRSA). MRSA is predictably resistant to beta-lactam antibiotics (except ceftaroline). Preferred therapy is vancomycin unless clinically contraindicated. Patient requires contact precautions if  hospitalized. CRITICAL RESULT CALLED TO, READ BACK BY AND VERIFIED WITH: G. ABBOTT,PHARMD 0226 09/21/2018 T. TYSOR    Methicillin resistance DETECTED (A) NOT DETECTED Final    Comment: CRITICAL RESULT CALLED TO, READ BACK BY AND VERIFIED WITH: G. ABBOTT,PHARMD 0226 09/21/2018 T. TYSOR    Streptococcus species NOT DETECTED NOT DETECTED Final  Streptococcus agalactiae NOT DETECTED NOT DETECTED Final   Streptococcus pneumoniae NOT DETECTED NOT DETECTED Final   Streptococcus pyogenes NOT DETECTED NOT DETECTED Final   Acinetobacter baumannii NOT DETECTED NOT DETECTED Final   Enterobacteriaceae species NOT DETECTED NOT DETECTED Final   Enterobacter cloacae complex NOT DETECTED NOT DETECTED Final   Escherichia coli NOT DETECTED NOT DETECTED Final   Klebsiella oxytoca NOT DETECTED NOT DETECTED Final   Klebsiella pneumoniae NOT DETECTED NOT DETECTED Final   Proteus species NOT DETECTED NOT DETECTED Final   Serratia marcescens NOT DETECTED NOT DETECTED Final   Haemophilus influenzae NOT DETECTED NOT DETECTED Final   Neisseria meningitidis NOT DETECTED NOT DETECTED Final   Pseudomonas aeruginosa NOT DETECTED NOT DETECTED Final   Candida albicans NOT DETECTED NOT DETECTED  Final   Candida glabrata NOT DETECTED NOT DETECTED Final   Candida krusei NOT DETECTED NOT DETECTED Final   Candida parapsilosis NOT DETECTED NOT DETECTED Final   Candida tropicalis NOT DETECTED NOT DETECTED Final    Comment: Performed at [facility], [place] [address] [place], [place] [address]  Culture, blood (routine x 2)     Status: Abnormal   Collection Time: 09/20/18  7:34 AM  Result Value Ref Range Status   Specimen Description BLOOD LEFT ANTECUBITAL  Final   Special Requests   Final    BOTTLES DRAWN AEROBIC AND ANAEROBIC Blood Culture adequate volume   Culture  Setup Time   Final    IN BOTH AEROBIC AND ANAEROBIC BOTTLES GRAM POSITIVE COCCI CRITICAL RESULT CALLED TO, READ BACK BY AND VERIFIED WITH: G. ABBOTT,PHARMD 0226 09/21/2018 T. TYSOR    Culture (A)  Final    STAPHYLOCOCCUS AUREUS SUSCEPTIBILITIES PERFORMED ON PREVIOUS CULTURE WITHIN THE LAST 5 DAYS. Performed at [facility], [place] [address], [place], [place] [address]    Report Status 09/22/2018 FINAL  Final  SARS Coronavirus 2 (CEPHEID - Performed in [place] hospital lab), Hosp Order     Status: None   Collection Time: 09/20/18  7:34 AM  Result Value Ref Range Status   SARS Coronavirus 2 NEGATIVE NEGATIVE Final    Comment: (NOTE) If result is NEGATIVE SARS-CoV-2 target nucleic acids are NOT DETECTED. The SARS-CoV-2 RNA is generally detectable in upper and lower  respiratory specimens during the acute phase of infection. The lowest  concentration of SARS-CoV-2 viral copies this assay can detect is 250  copies / mL. A negative result does not preclude SARS-CoV-2 infection  and should not be used as the sole basis for treatment or other  patient management decisions.  A negative result may occur with  improper specimen collection / handling, submission of specimen other  than nasopharyngeal swab, presence of viral mutation(s) within the  areas targeted by this assay, and inadequate number of viral  copies  (<250 copies / mL). A negative result must be combined with clinical  observations, patient history, and epidemiological information. If result is POSITIVE SARS-CoV-2 target nucleic acids are DETECTED. The SARS-CoV-2 RNA is generally detectable in upper and lower  respiratory specimens dur ing the acute phase of infection.  Positive  results are indicative of active infection with SARS-CoV-2.  Clinical  correlation with patient history and other diagnostic information is  necessary to determine patient infection status.  Positive results do  not rule out bacterial infection or co-infection with other viruses. If result is PRESUMPTIVE POSTIVE SARS-CoV-2 nucleic acids MAY BE PRESENT.   A presumptive positive result was obtained on the  submitted specimen  and confirmed on repeat testing.  While 2019 novel coronavirus  (SARS-CoV-2) nucleic acids may be present in the submitted sample  additional confirmatory testing may be necessary for epidemiological  and / or clinical management purposes  to differentiate between  SARS-CoV-2 and other Sarbecovirus currently known to infect humans.  If clinically indicated additional testing with an alternate test  methodology [phone]) is advised. The SARS-CoV-2 RNA is generally  detectable in upper and lower respiratory sp ecimens during the acute  phase of infection. The expected result is Negative. Fact Sheet for Patients:  StrictlyIdeas.no Fact Sheet for Healthcare Providers: BankingDealers.co.za This test is not yet approved or cleared by the [place] FDA and has been authorized for detection and/or diagnosis of SARS-CoV-2 by FDA under an Emergency Use Authorization (EUA).  This EUA will remain in effect (meaning this test can be used) for the duration of the COVID-19 declaration under Section 564(b)(1) of the Act, 21 U.S.C. section 360bbb-3(b)(1), unless the authorization is terminated  or revoked sooner. Performed at [facility], [place] [address] [place], [place] [address]   Culture, blood (routine x 2)     Status: None (Preliminary result)   Collection Time: 09/21/18 12:23 PM  Result Value Ref Range Status   Specimen Description BLOOD LEFT ANTECUBITAL  Final   Special Requests   Final    BOTTLES DRAWN AEROBIC ONLY Blood Culture results may not be optimal due to an inadequate volume of blood received in culture bottles   Culture   Final    NO GROWTH 4 DAYS Performed at [facility], [place] [address], [place], [place] [address]    Report Status PENDING  Incomplete  Culture, blood (routine x 2)     Status: None (Preliminary result)   Collection Time: 09/21/18  3:45 PM  Result Value Ref Range Status   Specimen Description BLOOD RIGHT HAND  Final   Special Requests   Final    BOTTLES DRAWN AEROBIC ONLY Blood Culture results may not be optimal due to an inadequate volume of blood received in culture bottles   Culture   Final    NO GROWTH 4 DAYS Performed at [facility], [place] [address], [place], [place] [address]    Report Status PENDING  Incomplete     Radiology Studies: Dg Chest Port 1 View  Result Date: 09/23/2018 CLINICAL DATA:  Tachypnea EXAM: PORTABLE CHEST 1 VIEW COMPARISON:  05/18/2018 FINDINGS: There are multifocal airspace opacities greatest at the right lung base. The heart size is mildly enlarged. There is no pneumothorax or large pleural effusion. There is some mild elevation of the right hemidiaphragm. There is no acute osseous abnormality. IMPRESSION: Multifocal airspace opacities, primarily at the lung bases, concerning for multifocal pneumonia or aspiration. Electronically Signed   By: Constance Holster M.D.   On: 09/23/2018 21:44    Scheduled Meds: . cloNIDine  0.2 mg Transdermal Weekly  . diazepam  2.5 mg Oral BID  . feeding supplement (ENSURE ENLIVE)  237 mL Oral TID BM  . folic acid  1 mg Oral Daily  . lactulose  30 g Oral  TID  . metoprolol tartrate  5 mg Intravenous Q8H  .  morphine injection  1 mg Intravenous Q6H  . multivitamin with minerals  1 tablet Oral Daily  . thiamine  100 mg Oral Daily   Continuous Infusions: . ampicillin-sulbactam (UNASYN) IV Stopped (09/25/18 1152)  . DAPTOmycin (CUBICIN)  IV 500 mg (09/24/18  2057)  . dextrose 5 % and 0.9% NaCl Stopped (09/25/18 0800)     LOS: 5 days    Barton Dubois, MD [phone]  [facility] 09/25/2018, 3:31 PM

## 2018-09-25 NOTE — Progress Notes (Signed)
Pts BP rose to 175/86 from 173/84 after 2.5 mg dose of scheduled IV metoprolol. Pt denies chest pain or headache. Pt continues to have altered mentation. On call NP, Bodenheimer paged and made aware. Will continue to treat and monitor per orders.

## 2018-09-25 NOTE — Evaluation (Signed)
Clinical/Bedside Swallow Evaluation Patient Details  Name: Mark [name] MRN: [id number] Date of Birth: 01-19-1956  Today's Date: 09/25/2018 Time: SLP Start Time (ACUTE ONLY): 0945 SLP Stop Time (ACUTE ONLY): 1021 SLP Time Calculation (min) (ACUTE ONLY): 36 min  Past Medical History:  Past Medical History:  Diagnosis Date  . Alcohol abuse   . Anemia   . Arthritis   . Ascites   . Cirrhosis ([place])   . Coffee ground emesis   . Dehydration 06/17/2017  . Febrile illness   . Heart murmur   . Hyperlipidemia   . Hypertension   . Leg swelling   . Myocardial infarction ([place]) 2012  . Preop cardiovascular exam 04/14/2013  . Sepsis ([place]) 06/17/2017  . Septic shock ([place]) 06/18/2017  . SIRS (systemic inflammatory response syndrome) ([place]) 07/11/2017  . Stroke [facility]) 2012   no deficits  . Thrombocytopenia ([place])    Past Surgical History:  Past Surgical History:  Procedure Laterality Date  . COLONOSCOPY WITH PROPOFOL N/A 02/07/2016   Procedure: COLONOSCOPY WITH PROPOFOL;  Surgeon: Milus Banister, MD;  Location: [facility] ENDOSCOPY;  Service: Endoscopy;  Laterality: N/A;  . ESOPHAGOGASTRODUODENOSCOPY (EGD) WITH PROPOFOL N/A 02/07/2016   Procedure: ESOPHAGOGASTRODUODENOSCOPY (EGD) WITH PROPOFOL;  Surgeon: Milus Banister, MD;  Location: [facility] ENDOSCOPY;  Service: Endoscopy;  Laterality: N/A;  . ESOPHAGOGASTRODUODENOSCOPY (EGD) WITH PROPOFOL N/A 06/22/2017   Procedure: ESOPHAGOGASTRODUODENOSCOPY (EGD) WITH PROPOFOL;  Surgeon: Jerene Bears, MD;  Location: [facility] ENDOSCOPY;  Service: Gastroenterology;  Laterality: N/A;  . HERNIA REPAIR Right    inguinal  . KNEE ARTHROSCOPY     bilateral/  12/14  . TEE WITHOUT CARDIOVERSION N/A 05/13/2018   Procedure: TRANSESOPHAGEAL ECHOCARDIOGRAM (TEE);  Surgeon: [name], MD;  Location: [facility] ENDOSCOPY;  Service: Cardiovascular;  Laterality: N/A;  . TOTAL KNEE ARTHROPLASTY Right 05/02/2013   Procedure: RIGHT TOTAL KNEE ARTHROPLASTY;  Surgeon: Mauri Pole, MD;   Location: [facility] ORS;  Service: Orthopedics;  Laterality: Right;   HPI:  63 yo male adm to [facility] with h/o polysubstance abuse and ETOH/cirrohisis with lumbar spine abscess and MRSA bacteremia.  Neuro surgery and ID following.  Pt with possible/probable aspiraiton episode and SlP swallow eval was ordered.  CXR showed lower lobe infiltrates concerning for aspiration.  Pt also had episode of vomiting bile type material.     Assessment / Plan / Recommendation Clinical Impression  Pt with no focal CN deficits from OME of which he would participate.  Tongue midline and voice is strong however he is dysarthric - likely due to AMS/? deconditioning.  Pt allowed SlP to provide oral care and accepted po intake.  He demonstrates delayed swallowing across all consistencies and tends to talk with boluses in his mouth.  Pt is confused and asking about money and that he was going to "hit you if you don't shut up" - not referring to this SLP.  Baseline wheeze did increase with po trials.  No overt indication of aspiration however due to mentation/cognition causing delay - recommend either dys1/nectar and allow tsps thin water or clears /nectar thick and allow tsps of thin water.  Given pt with emesis of bile material a few days prior, ? if this is source of possible pna.  Informed RN of recommendations and created swallow precaution sign.  [name] mentation, cirrhosis and epideral abscess of lumbar spine with increase his aspiration pna risk if he aspirates.  Also note palliative plans to touch base with family today = dependent on goals, SLP will  follow up.  Skilled intervention included determining least restrictive diet and helpful compensation strategies.    SLP Visit Diagnosis: Dysphagia, unspecified (R13.10)    Aspiration Risk  Moderate aspiration risk    Diet Recommendation Dysphagia 1 (Puree);Nectar-thick liquid;Other (Comment)(tsps thin water after mouth care)   Liquid Administration via: Cup;Spoon;Straw Medication  Administration: Whole meds with puree Supervision: Staff to assist with self feeding Compensations: Slow rate;Small sips/bites;Other (Comment)(clean mouth after meals, assure pt swallows before giving more) Postural Changes: Seated upright at 90 degrees;Remain upright for at least 30 minutes after po intake    Other  Recommendations Oral Care Recommendations: Oral care QID   Follow up Recommendations None      Frequency and Duration min 1 x/week  1 week       Prognosis Prognosis for Safe Diet Advancement: Fair Barriers to Reach Goals: Cognitive deficits      Swallow Study   General Date of Onset: 09/25/18 HPI: 63 yo male adm to [facility] with h/o polysubstance abuse and ETOH/cirrohisis with lumbar spine abscess and MRSA bacteremia.  Neuro surgery and ID following.  Pt with possible/probable aspiraiton episode and SlP swallow eval was ordered.  CXR showed lower lobe infiltrates concerning for aspiration.  Pt also had episode of vomiting bile type material.   Type of Study: Bedside Swallow Evaluation Diet Prior to this Study: NPO Temperature Spikes Noted: No Respiratory Status: Room air History of Recent Intubation: No Behavior/Cognition: Alert;Distractible;Confused(inconsistently follows directions) Oral Cavity Assessment: Other (comment);Dried secretions(on hard palate, pt allowed slp to remove) Oral Care Completed by SLP: Yes Oral Cavity - Dentition: Other (Comment);Missing dentition Self-Feeding Abilities: Total assist Patient Positioning: Upright in bed Baseline Vocal Quality: Normal Volitional Cough: Cognitively unable to elicit Volitional Swallow: Unable to elicit    Oral/Motor/Sensory Function Overall Oral Motor/Sensory Function: Generalized oral weakness   Ice Chips Ice chips: Not tested Other Comments: pt dysarthric and concerned about aspiration of large ice chips   Thin Liquid Thin Liquid: Impaired Presentation: Spoon;Straw Pharyngeal  Phase Impairments: Suspected  delayed Swallow    Nectar Thick Nectar Thick Liquid: Impaired Pharyngeal Phase Impairments: Suspected delayed Swallow   Honey Thick Honey Thick Liquid: Not tested   Puree Puree: Impaired Presentation: Spoon Oral Phase Functional Implications: Prolonged oral transit   Solid     Solid: Not tested      Macario Golds 09/25/2018,10:46 AM Luanna Salk, MS [place] Pager [phone] Office [phone]

## 2018-09-25 NOTE — Progress Notes (Signed)
Patient oriented to self continues to have altered mentation, pulled out midline tubing. IV team consult placed. Linen change provided, patient uncooperative at times. Hand mitts remain in place. Will continue to monitor.

## 2018-09-26 LAB — CULTURE, BLOOD (ROUTINE X 2)
Culture: NO GROWTH
Culture: NO GROWTH

## 2018-09-26 LAB — GLUCOSE, CAPILLARY: Glucose-Capillary: 72 mg/dL (ref 70–99)

## 2018-09-26 MED ORDER — SODIUM CHLORIDE 0.9 % IV SOLN
3.0000 g | Freq: Three times a day (TID) | INTRAVENOUS | Status: DC
  Filled 2018-09-26: qty 3

## 2018-09-26 MED ORDER — AMOXICILLIN-POT CLAVULANATE 875-125 MG PO TABS
1.0000 | ORAL_TABLET | Freq: Two times a day (BID) | ORAL | Status: AC
  Administered 2018-09-26 – 2018-09-27 (×2): 1 via ORAL
  Filled 2018-09-26 (×2): qty 1

## 2018-09-26 MED ORDER — LINEZOLID 600 MG PO TABS
600.0000 mg | ORAL_TABLET | Freq: Two times a day (BID) | ORAL | Status: AC
  Administered 2018-09-26 – 2018-09-27 (×2): 600 mg via ORAL
  Filled 2018-09-26 (×2): qty 1

## 2018-09-26 NOTE — Progress Notes (Signed)
Palliative Care  Family Meeting/Goals of Care  Spoke with patient's wife and daughter.   Summary:  1. DNR 2. Continue Medical Treatment including IV antibiotics 3. Continue to manage his encephalopathy which is likely multifactorial (liver dx, delirium, withdrawal) 4. His wife is his main caregiver and tells me he has "been like this before", she reports when he gets home, "he will ride his bike to the store and works on cars and even is able to chop wood" They report that his normal functioning state is to be "high" and that for most of his adult life it has been this way. His wife holds out hope that he can quit using ETOH and other drugs. 5. I prepared them for possible trajectories of his illness including EOL. 6. Prepared Goals of Care document and differed ACP/MOST completion.   [name], DO Palliative Medicine  Total Time: 60 min Greater than 50%  of this time was spent counseling and coordinating care related to the above assessment and plan.

## 2018-09-26 NOTE — Progress Notes (Signed)
  Speech Language Pathology Treatment: Dysphagia  Patient Details Name: Mark [name] MRN: [id number] DOB: 1955/11/14 Today's Date: 09/26/2018 Time: 2035-5974 SLP Time Calculation (min) (ACUTE ONLY): 19 min  Assessment / Plan / Recommendation Clinical Impression  ST follow up for therapeutic diet tolerance following swallowing evaluation yesterday.  Cranial nerve exam was repeated and generally unremarkable.  Lingual, labial, facial and jaw range of motion and strength appeared to be adequate.  Of note, the patient was easily agitated and impulsive this date requiring cues to take just one sip at a time.  The patient was presented with the following:  Thin liquids via spoon, cup and straw, nectar thick liquids via cup, pureed material and dry solids.  He was encouraged to self feed but required assistance to complete feeding tasks.  He presented with a possible oropharyngeal dysphagia.  The oral phase was marked by delayed oral transit with dry solids with oral residue seen.  He was noted to "swish" liquids but when he took assisted self fed cup and straw sips this was seen less.  Question possible delay in the swallow trigger with immediate and delayed cough seen given thin liquids following dry solids.  Cough was not seen given pureed material and nectar thick liquids.  Per [name] with Palliative Care the plan is to medically treat what can be treated and the patient's wife reported his mentation always improves after he returns home.  Recommend advance diet to dysphagia 1 with nectar thick liquids with MBS next date to assess efficiency and safety of the swallowing mechanism.     HPI HPI: 63 yo male adm to [facility] with h/o polysubstance abuse and ETOH/cirrohisis with lumbar spine abscess and MRSA bacteremia.  Neuro surgery and ID following.  Pt with possible/probable aspiration episode and SLP swallow eval was ordered.  CXR showed lower lobe infiltrates concerning for aspiration.  Pt also  had episode of vomiting bile type material.        SLP Plan  MBS       Recommendations  Diet recommendations: Dysphagia 1 (puree);Nectar-thick liquid Liquids provided via: Cup Medication Administration: Whole meds with puree Supervision: Staff to assist with self feeding Compensations: Minimize environmental distractions;Slow rate;Small sips/bites Postural Changes and/or Swallow Maneuvers: Seated upright 90 degrees                Oral Care Recommendations: Oral care QID Follow up Recommendations: Other (comment)(TBD) SLP Visit Diagnosis: Dysphagia, unspecified (R13.10) Plan: MBS       GO               Shelly Flatten, MA, CCC-SLP Acute Rehab SLP [phone]  Lamar Sprinkles 09/26/2018, 11:39 AM

## 2018-09-26 NOTE — Progress Notes (Signed)
Pharmacy note: Daptomycin and Unasyn  Patient with MRSA bacteremia and aspiration PNA on daptomycin and Unasyn. He has lost IV access and plans for IV placement by IR on 6/1.    -Spoke with Dr. Johnnye Sima  Plan -Augmentin 875mg  po bid x2 doses -Zyvox 600mg  po bid x2 doses -Reschedule Unasyn and dapto to start 8pm 6/1  -Will f/u IV placement  Hildred Laser, PharmD Clinical Pharmacist **Pharmacist phone directory can now be found on [email or web address] ([email or web address]).  Listed under [place].

## 2018-09-26 NOTE — Progress Notes (Signed)
Attempt to place PICC unsuccessful. Accessed vein easily but unable to to thread centrally meeting some type of blockage at the level of about the subclavian.  Various methods attempted without success.

## 2018-09-26 NOTE — Progress Notes (Signed)
Patient ID: Mark [name], male   DOB: 12-03-1955, 63 y.o.   MRN: [id number]  PROGRESS NOTE    Alaric Gladwin  WEX:[id number] DOB: Jan 04, 1956 DOA: 09/19/2018 PCP: [name], MD   Brief Narrative:  63 year old male with history of alcoholic liver cirrhosis, pancytopenia, hypertension, coronary artery disease, polysubstance abuse (alcohol, heroin, marijuana), CKD stage III and MSSA bacteremia in 04/2018 presented with worsening lower back pain.  COVID-19 rapid testing was negative on admission.  MRI of the lumbar spine revealed enhancement throughout the epidural space of L3 through the sacrum concerning for possible phlegmon/abscess, L5-S1 marrow enhancement concerning for septic arthritis, and spinal stenosis L3-S1. He was started on broad-spectrum antibiotics.  Neurosurgery was consulted.  Assessment & Plan:   Principal Problem:   MRSA bacteremia Active Problems:   Cirrhosis, alcoholic ([place])   Polysubstance abuse ([place])   Hyponatremia   Pancytopenia ([name])   Hepatic encephalopathy ([name])   Alcohol abuse   Septic arthritis ([name])   Anemia of chronic disease   Hypoalbuminemia   Hypoglycemia without diagnosis of diabetes mellitus   Epidural abscess   Abscess in epidural space of lumbar spine   Aspiration pneumonia ([name])   Septic arthritis/epidural abscess of lumbar spine -MRI of the lumbar spine revealed enhancement throughout the epidural space of L3 through the sacrum concerning for possible phlegmon/abscess, L5-S1 marrow enhancement concerning for septic arthritis, and spinal stenosis L3-S1 -Patient was initially started on broad-spectrum antibiotics vancomycin and Zosyn.  Patient has been subsequently switched to daptomycin per ID. -Neurosurgery has been consulted and had recommended IR consultation for probable drainage/biopsy.  As per IR, the abscess/collection was not large enough to aspirate or drain.  IR has notified Dr. Zada Finders. -Neurosurgery found patient to  be high risk for any surgical intervention and is recommended conservative management with IV antibiotics only. -No evidence of loss of bowel or bladder control for now -Continue pain management and supportive care. -Patient remains afebrile. -Planning for 14 days of IV daptomycin; with subsequent lifelong doxycycline treatment.  MRSA bacteremia -Susceptibilities pending.  ID has been consulted automatically.  Currently on daptomycin.   -Repeat blood cultures without any growth. -2D echo: Preserved ejection fraction, no wall motion normalities, no vegetations appreciated on any valve.  Mild regurgitation and he tricuspid valve was appreciated by color flow Doppler.. -As mentioned above patient will receive 14 days of daptomycin with subsequent lifelong suppressive therapy with the use of doxycycline. -Continue supportive care.  Acute kidney injury with metabolic acidosis -Baseline creatinine of 0.95 on 08/22/2018.  Patient on Lasix and spironolactone at home which is on hold. -Presented with creatinine of 1.82.  Creatinine has worsened at 2.4 -Continue IV fluids; and encourage increase oral intake. -Creatinine down to 1.17 -Follow renal function trend -Continue strict input and output. -Renal ultrasound, demonstrated no acute obstructive uropathy.  Alcoholic cirrhosis with hepatic encephalopathy -Continue lactulose. -Diuretics remains on hold. -Continue beta-blocker; resuming the use of nadolol  Polysubstance abuse -Patient admits to alcohol and recent heroin use prior to arrival -Continue CIWA protocol.  Social worker consult.  Continue multivitamin, thiamine and folate. -Patient is confused and lethargic this morning. -Ammonia level is low -Be careful with use of sedatives because of history of alcoholic cirrhosis.  Hyponatremia -Sodium 130 on admission. With a chronic component given cirrhosis.  -Continue gentle fluid resuscitation and supportive care. -Na 144 currently and is  stable.  Hypoalbuminemia -Follow recommendations by nutritional service -Will use feeding supplements.  Generalized deconditioning -Overall prognosis is very poor.  Patient is noncompliant and still actively using drugs.   -Will discuss regarding inpatient supervise therapy as he will require IV antibiotics for the next 6 to 8 weeks minimum. -Appreciate assistance and recommendations by palliative care; will continue medications as prescribed.  Continue IV antibiotics and supportive care. -Patient is DNR/DNI; looking to maintain quality and avoiding heroic invasive measurements.  -Continue treating what is treatable.  Shortness of breath/multifocal pneumonia -In the setting of aspiration -Continue treatment with Unasyn; 3 days of Unasyn so far. -Plan is for a total of 8 days treatment.  Dysphagia -In the setting of acute encephalopathy -speech therapy has seen patient and recommended dysphagia 1 with thin liquids.  DVT prophylaxis: SCDs  Code Status: Full Family Communication: None at bedside Disposition Plan: Patient not a candidate for IV antibiotic therapy given prior history of substance abuse.  Per infectious disease doctor plan P.o. for 14 days of IV antibiotics using daptomycin and subsequently lifelong return use of doxycycline.  For his aspiration pneumonia component will treat with a total of 8 days of Unasyn.  Consultants:  Neurosurgery ID consulted automatically because of positive blood cultures Palliative care  Procedures:  See below for x-ray reports 2D echo: IMPRESSIONS 1. The left ventricle has normal systolic function with an ejection fraction of 60-65%. The cavity size was normal. There is mildly increased left ventricular wall thickness. Left ventricular diastolic parameters were normal.  2. The right ventricle has normal systolic function. The cavity was normal. There is no increase in right ventricular wall thickness.  3. Left atrial size was severely  dilated.  4. No evidence of mitral valve stenosis.  5. Mild thickening of the aortic valve. Mild calcification of the aortic valve. No stenosis of the aortic valve.  Antimicrobials: 1 dose of vancomycin Zosyn and Ancef on 09/20/2018 -Daptomycin from 09/21/2018 onwards   Subjective: No fever, improvement in mentation appreciated, still with poor insight but able to follow commands and expressing wishes.  Denies nausea or vomiting.  Reports to have some improvement in appetite.  Still having some pain in his back.  Objective: Vitals:   09/25/18 1800 09/25/18 2015 09/26/18 0000 09/26/18 0600  BP: (!) 179/82 (!) 191/78 (!) 171/64 (!) 148/67  Pulse: (!) 59 64 (!) 58 63  Resp: 20 20    Temp: 97.9 F (36.6 C) 97.9 F (36.6 C) 98 F (36.7 C) 98.2 F (36.8 C)  TempSrc: Axillary Axillary Axillary Axillary  SpO2: 100% 100%    Weight:      Height:        Intake/Output Summary (Last 24 hours) at 09/26/2018 0854 Last data filed at 09/26/2018 0659 Gross per 24 hour  Intake 770 ml  Output -  Net 770 ml   Filed Weights   09/19/18 2011 09/20/18 1055  Weight: 61.2 kg 64.7 kg    Examination: General exam: More awake and alert this morning; able to follow commands and in no acute distress.  Patient is afebrile.  Breathing easier; no nausea or vomiting. Respiratory system: Scattered rhonchi, no crackles, no wheezing. Cardiovascular system:RRR. No murmurs, rubs, gallops. Gastrointestinal system: Abdomen is nondistended, soft and nontender. No organomegaly or masses felt. Normal bowel sounds heard. Central nervous system: No focal neurological deficits. Extremities: No cyanosis or clubbing; trace edema bilaterally. Skin: No rashes, lesions or ulcers Psychiatry: Judgement and insight remains impaired in the setting of altered mental status.  Currently without agitation or hallucinations.  Data Reviewed: I have personally reviewed following labs and imaging studies  CBC: Recent Labs  Lab  09/20/18 0726 09/21/18 0546 09/22/18 0427 09/24/18 0213 09/25/18 0512  WBC 4.9 7.9 11.3* 13.8* 11.8*  NEUTROABS 4.1  --  9.4*  --   --   HGB 9.1* 8.2* 8.2* 8.2* 7.6*  HCT 28.2* 24.3* 24.3* 25.3* 22.9*  MCV 98.3 94.6 92.7 97.7 94.6  PLT PLATELETS APPEAR DECREASED 53* 56* 64* 61*   Basic Metabolic Panel: Recent Labs  Lab 09/21/18 0546 09/22/18 0427 09/23/18 1757 09/24/18 0213 09/25/18 0512  NA 129* 133* 138 142 144  K 4.1 3.9 3.8 3.6 3.4*  CL 103 106 115* 115* 117*  CO2 16* 18* 16* 18* 18*  GLUCOSE 99 93 90 113* 130*  BUN 37* 40* 36* 36* 27*  CREATININE 2.40* 2.18* 1.52* 1.41* 1.17  CALCIUM 7.4* 7.4* 7.6* 7.7* 7.6*  MG  --  1.9  --   --   --    GFR: Estimated Creatinine Clearance: 54.8 mL/min (by C-G formula based on SCr of 1.17 mg/dL).   Liver Function Tests: Recent Labs  Lab 09/20/18 0726 09/21/18 0546 09/22/18 0427 09/23/18 1757 09/24/18 0213  AST 34 30 27 27 31   ALT 15 12 11 10 11   ALKPHOS 120 94 103 134* 152*  BILITOT 1.8* 1.6* 1.0 1.3* 1.1  PROT 6.0* 5.2* 4.9* 5.3* 5.6*  ALBUMIN 1.7* 1.3* 1.2* 1.4* 1.4*    Recent Labs  Lab 09/20/18 0930 09/21/18 0546 09/23/18 1757 09/24/18 0214  AMMONIA 111* 114* 11 17   Cardiac Enzymes: Recent Labs  Lab 09/22/18 0427  CKTOTAL 12*   CBG: Recent Labs  Lab 09/24/18 0110 09/24/18 0444 09/24/18 0808 09/25/18 1459 09/25/18 1705  GLUCAP 99 106* 112* 113* 99   Sepsis Labs: Recent Labs  Lab 09/20/18 0727 09/20/18 0905 09/20/18 1157  LATICACIDVEN 2.1* 2.8* 2.6*    Recent Results (from the past 240 hour(s))  Culture, blood (routine x 2)     Status: Abnormal   Collection Time: 09/20/18  7:25 AM  Result Value Ref Range Status   Specimen Description BLOOD RIGHT ANTECUBITAL  Final   Special Requests   Final    BOTTLES DRAWN AEROBIC AND ANAEROBIC Blood Culture results may not be optimal due to an excessive volume of blood received in culture bottles   Culture  Setup Time   Final    IN BOTH AEROBIC AND  ANAEROBIC BOTTLES GRAM POSITIVE COCCI CRITICAL RESULT CALLED TO, READ BACK BY AND VERIFIED WITH: G. ABBOTT,PHARMD 0226 09/21/2018 T. TYSOR Performed at [facility], [place] [address] [place], [place] [address]    Culture METHICILLIN RESISTANT STAPHYLOCOCCUS AUREUS (A)  Final   Report Status 09/22/2018 FINAL  Final   Organism ID, Bacteria METHICILLIN RESISTANT STAPHYLOCOCCUS AUREUS  Final      Susceptibility   Methicillin resistant staphylococcus aureus - MIC*    CIPROFLOXACIN <=0.5 SENSITIVE Sensitive     ERYTHROMYCIN >=8 RESISTANT Resistant     GENTAMICIN <=0.5 SENSITIVE Sensitive     OXACILLIN RESISTANT Resistant     TETRACYCLINE <=1 SENSITIVE Sensitive     VANCOMYCIN 1 SENSITIVE Sensitive     TRIMETH/SULFA <=10 SENSITIVE Sensitive     CLINDAMYCIN <=0.25 SENSITIVE Sensitive     RIFAMPIN <=0.5 SENSITIVE Sensitive     Inducible Clindamycin NEGATIVE Sensitive     * METHICILLIN RESISTANT STAPHYLOCOCCUS AUREUS  Blood Culture ID Panel (Reflexed)     Status: Abnormal   Collection Time: 09/20/18  7:25 AM  Result Value Ref Range Status   Enterococcus  species NOT DETECTED NOT DETECTED Final   Listeria monocytogenes NOT DETECTED NOT DETECTED Final   Staphylococcus species DETECTED (A) NOT DETECTED Final    Comment: CRITICAL RESULT CALLED TO, READ BACK BY AND VERIFIED WITH: Nadean Corwin [id number] 09/21/2018 T. TYSOR    Staphylococcus aureus (BCID) DETECTED (A) NOT DETECTED Final    Comment: Methicillin (oxacillin)-resistant Staphylococcus aureus (MRSA). MRSA is predictably resistant to beta-lactam antibiotics (except ceftaroline). Preferred therapy is vancomycin unless clinically contraindicated. Patient requires contact precautions if  hospitalized. CRITICAL RESULT CALLED TO, READ BACK BY AND VERIFIED WITH: G. ABBOTT,PHARMD 0226 09/21/2018 T. TYSOR    Methicillin resistance DETECTED (A) NOT DETECTED Final    Comment: CRITICAL RESULT CALLED TO, READ BACK BY AND VERIFIED WITH: G.  ABBOTT,PHARMD 0226 09/21/2018 T. TYSOR    Streptococcus species NOT DETECTED NOT DETECTED Final   Streptococcus agalactiae NOT DETECTED NOT DETECTED Final   Streptococcus pneumoniae NOT DETECTED NOT DETECTED Final   Streptococcus pyogenes NOT DETECTED NOT DETECTED Final   Acinetobacter baumannii NOT DETECTED NOT DETECTED Final   Enterobacteriaceae species NOT DETECTED NOT DETECTED Final   Enterobacter cloacae complex NOT DETECTED NOT DETECTED Final   Escherichia coli NOT DETECTED NOT DETECTED Final   Klebsiella oxytoca NOT DETECTED NOT DETECTED Final   Klebsiella pneumoniae NOT DETECTED NOT DETECTED Final   Proteus species NOT DETECTED NOT DETECTED Final   Serratia marcescens NOT DETECTED NOT DETECTED Final   Haemophilus influenzae NOT DETECTED NOT DETECTED Final   Neisseria meningitidis NOT DETECTED NOT DETECTED Final   Pseudomonas aeruginosa NOT DETECTED NOT DETECTED Final   Candida albicans NOT DETECTED NOT DETECTED Final   Candida glabrata NOT DETECTED NOT DETECTED Final   Candida krusei NOT DETECTED NOT DETECTED Final   Candida parapsilosis NOT DETECTED NOT DETECTED Final   Candida tropicalis NOT DETECTED NOT DETECTED Final    Comment: Performed at [facility], [place]. [address] [place], [place] [address]  Culture, blood (routine x 2)     Status: Abnormal   Collection Time: 09/20/18  7:34 AM  Result Value Ref Range Status   Specimen Description BLOOD LEFT ANTECUBITAL  Final   Special Requests   Final    BOTTLES DRAWN AEROBIC AND ANAEROBIC Blood Culture adequate volume   Culture  Setup Time   Final    IN BOTH AEROBIC AND ANAEROBIC BOTTLES GRAM POSITIVE COCCI CRITICAL RESULT CALLED TO, READ BACK BY AND VERIFIED WITH: G. ABBOTT,PHARMD 0226 09/21/2018 T. TYSOR    Culture (A)  Final    STAPHYLOCOCCUS AUREUS SUSCEPTIBILITIES PERFORMED ON PREVIOUS CULTURE WITHIN THE LAST 5 DAYS. Performed at [facility], [place] [address] [place], [place] [address]    Report Status  09/22/2018 FINAL  Final  SARS Coronavirus 2 (CEPHEID - Performed in [place] [facility] lab), Hosp Order     Status: None   Collection Time: 09/20/18  7:34 AM  Result Value Ref Range Status   SARS Coronavirus 2 NEGATIVE NEGATIVE Final    Comment: (NOTE) If result is NEGATIVE SARS-CoV-2 target nucleic acids are NOT DETECTED. The SARS-CoV-2 RNA is generally detectable in upper and lower  respiratory specimens during the acute phase of infection. The lowest  concentration of SARS-CoV-2 viral copies this assay can detect is 250  copies / mL. A negative result does not preclude SARS-CoV-2 infection  and should not be used as the sole basis for treatment or other  patient management decisions.  A negative result may occur with  improper specimen  collection / handling, submission of specimen other  than nasopharyngeal swab, presence of viral mutation(s) within the  areas targeted by this assay, and inadequate number of viral copies  (<250 copies / mL). A negative result must be combined with clinical  observations, patient history, and epidemiological information. If result is POSITIVE SARS-CoV-2 target nucleic acids are DETECTED. The SARS-CoV-2 RNA is generally detectable in upper and lower  respiratory specimens dur ing the acute phase of infection.  Positive  results are indicative of active infection with SARS-CoV-2.  Clinical  correlation with patient history and other diagnostic information is  necessary to determine patient infection status.  Positive results do  not rule out bacterial infection or co-infection with other viruses. If result is PRESUMPTIVE POSTIVE SARS-CoV-2 nucleic acids MAY BE PRESENT.   A presumptive positive result was obtained on the submitted specimen  and confirmed on repeat testing.  While 2019 novel coronavirus  (SARS-CoV-2) nucleic acids may be present in the submitted sample  additional confirmatory testing may be necessary for epidemiological  and / or  clinical management purposes  to differentiate between  SARS-CoV-2 and other Sarbecovirus currently known to infect humans.  If clinically indicated additional testing with an alternate test  methodology [phone]) is advised. The SARS-CoV-2 RNA is generally  detectable in upper and lower respiratory sp ecimens during the acute  phase of infection. The expected result is Negative. Fact Sheet for Patients:  StrictlyIdeas.no Fact Sheet for Healthcare Providers: BankingDealers.co.za This test is not yet approved or cleared by the [place] FDA and has been authorized for detection and/or diagnosis of SARS-CoV-2 by FDA under an Emergency Use Authorization (EUA).  This EUA will remain in effect (meaning this test can be used) for the duration of the COVID-19 declaration under Section 564(b)(1) of the Act, 21 U.S.C. section 360bbb-3(b)(1), unless the authorization is terminated or revoked sooner. Performed at [facility], [place] [address], [place], [place] [address]   Culture, blood (routine x 2)     Status: None (Preliminary result)   Collection Time: 09/21/18 12:23 PM  Result Value Ref Range Status   Specimen Description BLOOD LEFT ANTECUBITAL  Final   Special Requests   Final    BOTTLES DRAWN AEROBIC ONLY Blood Culture results may not be optimal due to an inadequate volume of blood received in culture bottles   Culture   Final    NO GROWTH 4 DAYS Performed at [facility], [place] [address] [place], [place] [address]    Report Status PENDING  Incomplete  Culture, blood (routine x 2)     Status: None (Preliminary result)   Collection Time: 09/21/18  3:45 PM  Result Value Ref Range Status   Specimen Description BLOOD RIGHT HAND  Final   Special Requests   Final    BOTTLES DRAWN AEROBIC ONLY Blood Culture results may not be optimal due to an inadequate volume of blood received in culture bottles   Culture   Final    NO GROWTH  4 DAYS Performed at [facility], [place] [address], [place], [place] [address]    Report Status PENDING  Incomplete     Radiology Studies: [place] Ekg Site Rite  Result Date: 09/25/2018 If Site Rite image not attached, placement could not be confirmed due to current cardiac rhythm.   Scheduled Meds: . cloNIDine  0.2 mg Transdermal Weekly  . diazepam  2.5 mg Oral BID  . feeding supplement (ENSURE ENLIVE)  237 mL  Oral TID BM  . folic acid  1 mg Oral Daily  . lactulose  30 g Oral TID  .  morphine injection  1 mg Intravenous Q6H  . multivitamin with minerals  1 tablet Oral Daily  . nadolol  20 mg Oral Daily  . thiamine  100 mg Oral Daily   Continuous Infusions: . ampicillin-sulbactam (UNASYN) IV Stopped (09/25/18 1152)  . DAPTOmycin (CUBICIN)  IV 500 mg (09/24/18 2057)  . dextrose 5 % and 0.9% NaCl Stopped (09/25/18 0800)     LOS: 6 days    Barton Dubois, MD [phone]  [facility] 09/26/2018, 8:54 AM

## 2018-09-26 NOTE — Progress Notes (Signed)
INFECTIOUS DISEASE PROGRESS NOTE  ID: Mark [name] is a 63 y.o. male with  Principal Problem:   MRSA bacteremia Active Problems:   Cirrhosis, alcoholic ([place])   Polysubstance abuse ([place])   Hyponatremia   Pancytopenia ([place])   Hepatic encephalopathy ([name])   Alcohol abuse   Septic arthritis ([place])   Anemia of chronic disease   Hypoalbuminemia   Hypoglycemia without diagnosis of diabetes mellitus   Epidural abscess   Abscess in epidural space of lumbar spine   Aspiration pneumonia ([name])  Subjective: Resting quietly  Abtx:  Anti-infectives (From admission, onward)   Start     Dose/Rate Route Frequency Ordered Stop   09/24/18 2000  DAPTOmycin (CUBICIN) 500 mg in sodium chloride 0.9 % IVPB     500 mg 220 mL/hr over 30 Minutes Intravenous Daily 09/23/18 2233     09/24/18 1330  Ampicillin-Sulbactam (UNASYN) 3 g in sodium chloride 0.9 % 100 mL IVPB     3 g 200 mL/hr over 30 Minutes Intravenous Every 8 hours 09/24/18 1225     09/24/18 0600  piperacillin-tazobactam (ZOSYN) IVPB 3.375 g  Status:  Discontinued     3.375 g 12.5 mL/hr over 240 Minutes Intravenous Every 8 hours 09/23/18 2233 09/24/18 1135   09/23/18 2300  piperacillin-tazobactam (ZOSYN) IVPB 3.375 g     3.375 g 100 mL/hr over 30 Minutes Intravenous  Once 09/23/18 2233 09/24/18 0318   09/21/18 1000  vancomycin (VANCOCIN) IVPB 750 mg/150 ml premix  Status:  Discontinued     750 mg 150 mL/hr over 60 Minutes Intravenous Every 24 hours 09/20/18 0949 09/21/18 0233   09/21/18 1000  DAPTOmycin (CUBICIN) 500 mg in sodium chloride 0.9 % IVPB  Status:  Discontinued     500 mg 220 mL/hr over 30 Minutes Intravenous Every 48 hours 09/21/18 0903 09/23/18 2233   09/21/18 0400  ceFAZolin (ANCEF) IVPB 2g/100 mL premix  Status:  Discontinued     2 g 200 mL/hr over 30 Minutes Intravenous Every 8 hours 09/21/18 0233 09/21/18 0900   09/20/18 1800  piperacillin-tazobactam (ZOSYN) IVPB 3.375 g  Status:  Discontinued     3.375 g  12.5 mL/hr over 240 Minutes Intravenous Every 8 hours 09/20/18 0929 09/20/18 0935   09/20/18 1500  piperacillin-tazobactam (ZOSYN) IVPB 3.375 g  Status:  Discontinued     3.375 g 12.5 mL/hr over 240 Minutes Intravenous Every 8 hours 09/20/18 0935 09/21/18 0233   09/20/18 1000  piperacillin-tazobactam (ZOSYN) IVPB 3.375 g  Status:  Discontinued     3.375 g 100 mL/hr over 30 Minutes Intravenous  Once 09/20/18 0928 09/20/18 0934   09/20/18 1000  vancomycin (VANCOCIN) 1,250 mg in sodium chloride 0.9 % 250 mL IVPB  Status:  Discontinued     1,250 mg 166.7 mL/hr over 90 Minutes Intravenous  Once 09/20/18 0928 09/20/18 0934   09/20/18 1000  vancomycin (VANCOCIN) 500 mg in sodium chloride 0.9 % 100 mL IVPB     500 mg 100 mL/hr over 60 Minutes Intravenous  Once 09/20/18 0937 09/20/18 1407   09/20/18 0715  vancomycin (VANCOCIN) IVPB 1000 mg/200 mL premix     1,000 mg 200 mL/hr over 60 Minutes Intravenous  Once 09/20/18 0706 09/20/18 0906   09/20/18 0715  piperacillin-tazobactam (ZOSYN) IVPB 3.375 g     3.375 g 100 mL/hr over 30 Minutes Intravenous  Once 09/20/18 0706 09/20/18 1008      Medications:  Scheduled: . cloNIDine  0.2 mg Transdermal Weekly  . diazepam  2.5 mg Oral BID  . feeding supplement (ENSURE ENLIVE)  237 mL Oral TID BM  . folic acid  1 mg Oral Daily  . lactulose  30 g Oral TID  .  morphine injection  1 mg Intravenous Q6H  . multivitamin with minerals  1 tablet Oral Daily  . nadolol  20 mg Oral Daily  . thiamine  100 mg Oral Daily    Objective: Vital signs in last 24 hours: Temp:  [97.9 F (36.6 C)-98.2 F (36.8 C)] 98 F (36.7 C) (05/31 1341) Pulse Rate:  [58-64] 60 (05/31 1341) Resp:  [18-20] 18 (05/31 1341) BP: (131-191)/(64-86) 131/86 (05/31 1341) SpO2:  [100 %] 100 % (05/31 1341)   General appearance: fatigued and no distress  Lab Results Recent Labs    09/24/18 0213 09/25/18 0512  WBC 13.8* 11.8*  HGB 8.2* 7.6*  HCT 25.3* 22.9*  NA 142 144  K 3.6  3.4*  CL 115* 117*  CO2 18* 18*  BUN 36* 27*  CREATININE 1.41* 1.17   Liver Panel Recent Labs    09/23/18 1757 09/24/18 0213  PROT 5.3* 5.6*  ALBUMIN 1.4* 1.4*  AST 27 31  ALT 10 11  ALKPHOS 134* 152*  BILITOT 1.3* 1.1   Sedimentation Rate No results for input(s): ESRSEDRATE in the last 72 hours. C-Reactive Protein No results for input(s): CRP in the last 72 hours.  Microbiology: Recent Results (from the past 240 hour(s))  Culture, blood (routine x 2)     Status: Abnormal   Collection Time: 09/20/18  7:25 AM  Result Value Ref Range Status   Specimen Description BLOOD RIGHT ANTECUBITAL  Final   Special Requests   Final    BOTTLES DRAWN AEROBIC AND ANAEROBIC Blood Culture results may not be optimal due to an excessive volume of blood received in culture bottles   Culture  Setup Time   Final    IN BOTH AEROBIC AND ANAEROBIC BOTTLES GRAM POSITIVE COCCI CRITICAL RESULT CALLED TO, READ BACK BY AND VERIFIED WITH: Salli Real [id number] 09/21/2018 Mena Goes Performed at [facility], [place] [address], [place],  [address]    Culture METHICILLIN RESISTANT STAPHYLOCOCCUS AUREUS (A)  Final   Report Status 09/22/2018 FINAL  Final   Organism ID, Bacteria METHICILLIN RESISTANT STAPHYLOCOCCUS AUREUS  Final      Susceptibility   Methicillin resistant staphylococcus aureus - MIC*    CIPROFLOXACIN <=0.5 SENSITIVE Sensitive     ERYTHROMYCIN >=8 RESISTANT Resistant     GENTAMICIN <=0.5 SENSITIVE Sensitive     OXACILLIN RESISTANT Resistant     TETRACYCLINE <=1 SENSITIVE Sensitive     VANCOMYCIN 1 SENSITIVE Sensitive     TRIMETH/SULFA <=10 SENSITIVE Sensitive     CLINDAMYCIN <=0.25 SENSITIVE Sensitive     RIFAMPIN <=0.5 SENSITIVE Sensitive     Inducible Clindamycin NEGATIVE Sensitive     * METHICILLIN RESISTANT STAPHYLOCOCCUS AUREUS  Blood Culture ID Panel (Reflexed)     Status: Abnormal   Collection Time: 09/20/18  7:25 AM  Result Value Ref Range Status   Enterococcus  species NOT DETECTED NOT DETECTED Final   Listeria monocytogenes NOT DETECTED NOT DETECTED Final   Staphylococcus species DETECTED (A) NOT DETECTED Final    Comment: CRITICAL RESULT CALLED TO, READ BACK BY AND VERIFIED WITH: Nadean Corwin [id number] 09/21/2018 T. TYSOR    Staphylococcus aureus (BCID) DETECTED (A) NOT DETECTED Final    Comment: Methicillin (oxacillin)-resistant Staphylococcus aureus (MRSA). MRSA is predictably resistant to beta-lactam antibiotics (except  ceftaroline). Preferred therapy is vancomycin unless clinically contraindicated. Patient requires contact precautions if  hospitalized. CRITICAL RESULT CALLED TO, READ BACK BY AND VERIFIED WITH: G. ABBOTT,PHARMD 0226 09/21/2018 T. TYSOR    Methicillin resistance DETECTED (A) NOT DETECTED Final    Comment: CRITICAL RESULT CALLED TO, READ BACK BY AND VERIFIED WITH: G. ABBOTT,PHARMD 0226 09/21/2018 T. TYSOR    Streptococcus species NOT DETECTED NOT DETECTED Final   Streptococcus agalactiae NOT DETECTED NOT DETECTED Final   Streptococcus pneumoniae NOT DETECTED NOT DETECTED Final   Streptococcus pyogenes NOT DETECTED NOT DETECTED Final   Acinetobacter baumannii NOT DETECTED NOT DETECTED Final   Enterobacteriaceae species NOT DETECTED NOT DETECTED Final   Enterobacter cloacae complex NOT DETECTED NOT DETECTED Final   Escherichia coli NOT DETECTED NOT DETECTED Final   Klebsiella oxytoca NOT DETECTED NOT DETECTED Final   Klebsiella pneumoniae NOT DETECTED NOT DETECTED Final   Proteus species NOT DETECTED NOT DETECTED Final   Serratia marcescens NOT DETECTED NOT DETECTED Final   Haemophilus influenzae NOT DETECTED NOT DETECTED Final   Neisseria meningitidis NOT DETECTED NOT DETECTED Final   Pseudomonas aeruginosa NOT DETECTED NOT DETECTED Final   Candida albicans NOT DETECTED NOT DETECTED Final   Candida glabrata NOT DETECTED NOT DETECTED Final   Candida krusei NOT DETECTED NOT DETECTED Final   Candida parapsilosis NOT DETECTED  NOT DETECTED Final   Candida tropicalis NOT DETECTED NOT DETECTED Final    Comment: Performed at [facility], [place]. [address] [place], [place] [address]  Culture, blood (routine x 2)     Status: Abnormal   Collection Time: 09/20/18  7:34 AM  Result Value Ref Range Status   Specimen Description BLOOD LEFT ANTECUBITAL  Final   Special Requests   Final    BOTTLES DRAWN AEROBIC AND ANAEROBIC Blood Culture adequate volume   Culture  Setup Time   Final    IN BOTH AEROBIC AND ANAEROBIC BOTTLES GRAM POSITIVE COCCI CRITICAL RESULT CALLED TO, READ BACK BY AND VERIFIED WITH: G. ABBOTT,PHARMD 0226 09/21/2018 T. TYSOR    Culture (A)  Final    STAPHYLOCOCCUS AUREUS SUSCEPTIBILITIES PERFORMED ON PREVIOUS CULTURE WITHIN THE LAST 5 DAYS. Performed at [facility], [place] [address] [place], [place] [address]    Report Status 09/22/2018 FINAL  Final  SARS Coronavirus 2 (CEPHEID - Performed in [facility]), Hosp Order     Status: None   Collection Time: 09/20/18  7:34 AM  Result Value Ref Range Status   SARS Coronavirus 2 NEGATIVE NEGATIVE Final    Comment: (NOTE) If result is NEGATIVE SARS-CoV-2 target nucleic acids are NOT DETECTED. The SARS-CoV-2 RNA is generally detectable in upper and lower  respiratory specimens during the acute phase of infection. The lowest  concentration of SARS-CoV-2 viral copies this assay can detect is 250  copies / mL. A negative result does not preclude SARS-CoV-2 infection  and should not be used as the sole basis for treatment or other  patient management decisions.  A negative result may occur with  improper specimen collection / handling, submission of specimen other  than nasopharyngeal swab, presence of viral mutation(s) within the  areas targeted by this assay, and inadequate number of viral copies  (<250 copies / mL). A negative result must be combined with clinical  observations, patient history, and epidemiological information. If  result is POSITIVE SARS-CoV-2 target nucleic acids are DETECTED. The SARS-CoV-2 RNA is generally detectable in upper and lower  respiratory specimens  dur ing the acute phase of infection.  Positive  results are indicative of active infection with SARS-CoV-2.  Clinical  correlation with patient history and other diagnostic information is  necessary to determine patient infection status.  Positive results do  not rule out bacterial infection or co-infection with other viruses. If result is PRESUMPTIVE POSTIVE SARS-CoV-2 nucleic acids MAY BE PRESENT.   A presumptive positive result was obtained on the submitted specimen  and confirmed on repeat testing.  While 2019 novel coronavirus  (SARS-CoV-2) nucleic acids may be present in the submitted sample  additional confirmatory testing may be necessary for epidemiological  and / or clinical management purposes  to differentiate between  SARS-CoV-2 and other Sarbecovirus currently known to infect humans.  If clinically indicated additional testing with an alternate test  methodology [phone]) is advised. The SARS-CoV-2 RNA is generally  detectable in upper and lower respiratory sp ecimens during the acute  phase of infection. The expected result is Negative. Fact Sheet for Patients:  StrictlyIdeas.no Fact Sheet for Healthcare Providers: BankingDealers.co.za This test is not yet approved or cleared by the [place] FDA and has been authorized for detection and/or diagnosis of SARS-CoV-2 by FDA under an Emergency Use Authorization (EUA).  This EUA will remain in effect (meaning this test can be used) for the duration of the COVID-19 declaration under Section 564(b)(1) of the Act, 21 U.S.C. section 360bbb-3(b)(1), unless the authorization is terminated or revoked sooner. Performed at [facility], [place] [address], [place], [place] [address]   Culture, blood (routine x 2)     Status: None  (Preliminary result)   Collection Time: 09/21/18 12:23 PM  Result Value Ref Range Status   Specimen Description BLOOD LEFT ANTECUBITAL  Final   Special Requests   Final    BOTTLES DRAWN AEROBIC ONLY Blood Culture results may not be optimal due to an inadequate volume of blood received in culture bottles   Culture   Final    NO GROWTH 4 DAYS Performed at [facility], [place] [address], [place], [place] [address]    Report Status PENDING  Incomplete  Culture, blood (routine x 2)     Status: None (Preliminary result)   Collection Time: 09/21/18  3:45 PM  Result Value Ref Range Status   Specimen Description BLOOD RIGHT HAND  Final   Special Requests   Final    BOTTLES DRAWN AEROBIC ONLY Blood Culture results may not be optimal due to an inadequate volume of blood received in culture bottles   Culture   Final    NO GROWTH 4 DAYS Performed at [facility], [place] [address] [place],  [address]    Report Status PENDING  Incomplete    Studies/Results: [place] Ekg Site Rite  Result Date: 09/25/2018 If Site Rite image not attached, placement could not be confirmed due to current cardiac rhythm.    Assessment/Plan: MRSA bacteremia (5-25) Epidural abscess, septic arthritis L3-4 Aspiration pneumonia CKD3 Alcoholic cirrhosis Encephalopathy  Total days of antibiotics: 6 daptomycin, 3 unsayn  Last CK 12 (5-27) Would stop unasyn at day 8 (I have set this in Epic) Stop dapto at day 14, then 4 weeks of doxy.  Continue to watch CK while on dapto ID available as needed.           Bobby Rumpf MD, FACP Infectious Diseases (pager) [phone] [email or web address] 09/26/2018, 2:02 PM  LOS: 6 days

## 2018-09-27 ENCOUNTER — Inpatient Hospital Stay (HOSPITAL_COMMUNITY): Payer: Medicare Other

## 2018-09-27 LAB — GLUCOSE, CAPILLARY
Glucose-Capillary: 106 mg/dL — ABNORMAL HIGH (ref 70–99)
Glucose-Capillary: 109 mg/dL — ABNORMAL HIGH (ref 70–99)
Glucose-Capillary: 138 mg/dL — ABNORMAL HIGH (ref 70–99)
Glucose-Capillary: 78 mg/dL (ref 70–99)
Glucose-Capillary: 84 mg/dL (ref 70–99)
Glucose-Capillary: 88 mg/dL (ref 70–99)

## 2018-09-27 MED ORDER — DOXYCYCLINE HYCLATE 100 MG PO TABS
100.0000 mg | ORAL_TABLET | Freq: Two times a day (BID) | ORAL | Status: DC
  Administered 2018-09-27: 100 mg via ORAL
  Filled 2018-09-27: qty 1

## 2018-09-27 MED ORDER — LIDOCAINE HCL 1 % IJ SOLN
INTRAMUSCULAR | Status: AC
  Filled 2018-09-27: qty 20

## 2018-09-27 MED ORDER — LIDOCAINE HCL 1 % IJ SOLN
INTRAMUSCULAR | Status: AC | PRN
  Administered 2018-09-27: 5 mL

## 2018-09-27 MED ORDER — AMOXICILLIN-POT CLAVULANATE 875-125 MG PO TABS
1.0000 | ORAL_TABLET | Freq: Two times a day (BID) | ORAL | Status: DC
  Administered 2018-09-27 – 2018-09-29 (×5): 1 via ORAL
  Filled 2018-09-27 (×5): qty 1

## 2018-09-27 MED ORDER — SODIUM CHLORIDE 0.9 % IV SOLN
500.0000 mg | Freq: Every day | INTRAVENOUS | Status: AC
  Administered 2018-09-27 – 2018-10-03 (×7): 500 mg via INTRAVENOUS
  Filled 2018-09-27 (×10): qty 10

## 2018-09-27 MED ORDER — SODIUM CHLORIDE 0.9% FLUSH
10.0000 mL | Freq: Two times a day (BID) | INTRAVENOUS | Status: DC
  Administered 2018-09-28 – 2018-10-03 (×9): 10 mL

## 2018-09-27 NOTE — Procedures (Signed)
Bacteremia  S/p RUE DL POWER PICC  TIP SVCRA NO COMP STABLE EBL MIN READY FOR USE

## 2018-09-27 NOTE — Progress Notes (Signed)
Patient ID: Mark [name], male   DOB: Dec 12, 1955, 63 y.o.   MRN: [id number]   Pt was brought to IR for PICC **Refused procedure

## 2018-09-27 NOTE — Progress Notes (Signed)
Pharmacy Antibiotic Note  Trevonne [name] is a 63 y.o. male admitted on 09/19/2018 with epidural abscess and septic arthritis of L3-L4 facet joints complicated with MRSA bacteremia.  Daptomycin to continue for MRSA bacteremia and Lumbar epidural abscess and facet septic arthritis , per ID. ID noted need at least 8 weeks of therapy   IV access was lost but has now been regained with a PICC per IR. Will resume IV daptomycin  Plan:  Restart Daptomycin 500 mg IV every 24 hours - Weekly CK checks - Will continue to follow renal function, culture results, LOT, and antibiotic de-escalation plans   Height: 5\' 5"  (165.1 cm) Weight: 142 lb 10.2 oz (64.7 kg) IBW/kg (Calculated) : 61.5  Temp (24hrs), Avg:97.8 F (36.6 C), Min:97.1 F (36.2 C), Max:98.5 F (36.9 C)  Recent Labs  Lab 09/21/18 0546 09/22/18 0427 09/23/18 1757 09/24/18 0213 09/25/18 0512  WBC 7.9 11.3*  --  13.8* 11.8*  CREATININE 2.40* 2.18* 1.52* 1.41* 1.17    Estimated Creatinine Clearance: 54.8 mL/min (by C-G formula based on SCr of 1.17 mg/dL).    No Known Allergies  Antimicrobials this admission: 5/25 Vancomycin  >> 5/26 5/25 Zosyn >> 5/26; 5/28>5/29 5/26 Cefazolin >> 5/26 5/26 Daptomycin >>         5/27 baseline CK 12 6/1: Augmentin >>  Dose adjustments this admission: N/A  Microbiology results: 5/25 BCx: MRSA 5/25 Covid negative 5/26 BCx >> ngtd  Mark [name] A. Mark [name], PharmD, [place] Please utilize Amion for appropriate phone number to reach the unit pharmacist ([place])   09/27/2018 4:17 PM

## 2018-09-27 NOTE — Progress Notes (Signed)
Patient ID: Mark [name], male   DOB: [birth year], 63 y.o.   MRN: [id number]  PROGRESS NOTE    Mark [name]  TKW:[id number] DOB: 10-23-1955 DOA: 09/19/2018 PCP: [name], MD   Brief Narrative:  63 year old male with history of alcoholic liver cirrhosis, pancytopenia, hypertension, coronary artery disease, polysubstance abuse (alcohol, heroin, marijuana), CKD stage III and MSSA bacteremia in 04/2018 presented with worsening lower back pain.  COVID-19 rapid testing was negative on admission.  MRI of the lumbar spine revealed enhancement throughout the epidural space of L3 through the sacrum concerning for possible phlegmon/abscess, L5-S1 marrow enhancement concerning for septic arthritis, and spinal stenosis L3-S1. He was started on broad-spectrum antibiotics.  Neurosurgery was consulted.  Assessment & Plan:   Principal Problem:   MRSA bacteremia Active Problems:   Cirrhosis, alcoholic ([place])   Polysubstance abuse ([place])   Hyponatremia   Pancytopenia ([name])   Hepatic encephalopathy ([name])   Alcohol abuse   Septic arthritis ([name])   Anemia of chronic disease   Hypoalbuminemia   Hypoglycemia without diagnosis of diabetes mellitus   Epidural abscess   Abscess in epidural space of lumbar spine   Aspiration pneumonia ([name])   Septic arthritis/epidural abscess of lumbar spine -MRI of the lumbar spine revealed enhancement throughout the epidural space of L3 through the sacrum concerning for possible phlegmon/abscess, L5-S1 marrow enhancement concerning for septic arthritis, and spinal stenosis L3-S1 -Patient was initially started on broad-spectrum antibiotics vancomycin and Zosyn.  Patient has been subsequently switched to daptomycin per ID. -Neurosurgery has been consulted and had recommended IR consultation for probable drainage/biopsy.  As per IR, the abscess/collection was not large enough to aspirate or drain.  IR has notified Dr. Zada Finders. -Neurosurgery found patient to  be high risk for any surgical intervention and is recommended conservative management with IV antibiotics only. -No evidence of loss of bowel or bladder control for now -Continue pain management and supportive care. -Patient remains afebrile. -Planning for a total of 14 days of IV daptomycin; with subsequent lifelong doxycycline treatment. -Given issues with IV access patient received 2 doses of Augmentin and 2 doses of Zyvox on 6/31 20.  MRSA bacteremia -Susceptibilities pending.  ID has been consulted automatically.  Currently on daptomycin.   -Repeat blood cultures without any growth. -2D echo: Preserved ejection fraction, no wall motion normalities, no vegetations appreciated on any valve.  Mild regurgitation and he tricuspid valve was appreciated by color flow Doppler.. -As mentioned above patient will receive 14 days of daptomycin with subsequent lifelong suppressive therapy with the use of doxycycline. -Continue supportive care.  Acute kidney injury with metabolic acidosis -Baseline creatinine of 0.95 on 08/22/2018.  Patient on Lasix and spironolactone at home which is on hold. -Presented with creatinine of 1.72.  Creatinine has worsened at 2.4 -Continue IV fluids; and encourage increase oral intake. -Creatinine down to 1.17 -Follow renal function trend -Continue strict input and output. -Renal ultrasound, demonstrated no acute obstructive uropathy.  Alcoholic cirrhosis with hepatic encephalopathy -Continue lactulose. -Diuretics remains on hold. -Continue beta-blocker; resuming the use of nadolol  Polysubstance abuse -Patient admits to alcohol and recent heroin use prior to arrival -Continue CIWA protocol.  Social worker consult.  Continue multivitamin, thiamine and folate. -Patient is confused and lethargic this morning. -Ammonia level is low -Be careful with use of sedatives because of history of alcoholic cirrhosis.  Hyponatremia -Sodium 130 on admission. With a chronic  component given cirrhosis.  -Continue gentle fluid resuscitation and supportive care. -Na 144 currently  and is stable.  Hypoalbuminemia -Follow recommendations by nutritional service -Will use feeding supplements.  Generalized deconditioning -Overall prognosis is very poor.  Patient is noncompliant and still actively using drugs.   -Will discuss regarding inpatient supervise therapy as he will require IV antibiotics for the next 6 to 8 weeks minimum. -Appreciate assistance and recommendations by palliative care; will continue medications as prescribed.  Continue IV antibiotics and supportive care. -Patient is DNR/DNI; looking to maintain quality and avoiding heroic invasive measurements.  -Continue treating what is treatable.  Shortness of breath/multifocal pneumonia -In the setting of aspiration -Continue treatment with Unasyn; 3 days of Unasyn so far. -Plan is for a total of 8 days treatment.  Dysphagia -In the setting of acute encephalopathy -speech therapy has seen patient and recommended dysphagia 1 with thin liquids.  DVT prophylaxis: SCDs  Code Status: Full Family Communication: None at bedside Disposition Plan: Patient not a candidate for IV antibiotic therapy given prior history of substance abuse.  Per infectious disease doctor plan P.o. for 14 days of IV antibiotics using daptomycin and subsequently lifelong return use of doxycycline.  For his aspiration pneumonia component will treat with a total of 8 days of Unasyn.  Consultants:  Neurosurgery ID consulted automatically because of positive blood cultures Palliative care IR: For placement of PICC.  Procedures:  See below for x-ray reports 2D echo: IMPRESSIONS 1. The left ventricle has normal systolic function with an ejection fraction of 60-65%. The cavity size was normal. There is mildly increased left ventricular wall thickness. Left ventricular diastolic parameters were normal.  2. The right ventricle has  normal systolic function. The cavity was normal. There is no increase in right ventricular wall thickness.  3. Left atrial size was severely dilated.  4. No evidence of mitral valve stenosis.  5. Mild thickening of the aortic valve. Mild calcification of the aortic valve. No stenosis of the aortic valve.  Antimicrobials: 1 dose of vancomycin Zosyn and Ancef on 09/20/2018 -Daptomycin from 09/21/2018 onwards   Subjective: No fever, more alert and oriented x2 currently; no nausea or vomiting.  No wearing any nasal cannula supplementation at this time, agreement with family for IV antibiotics and attempt rehab has been decided.   Objective: Vitals:   09/26/18 0600 09/26/18 1341 09/26/18 2124 09/27/18 0501  BP: (!) 148/67 131/86 (!) 155/63 (!) 149/56  Pulse: 63 60 (!) 56 (!) 55  Resp:  18    Temp: 98.2 F (36.8 C) 98 F (36.7 C) 98.5 F (36.9 C) (!) 97.1 F (36.2 C)  TempSrc: Axillary  Oral Oral  SpO2:  100% 100% 100%  Weight:      Height:       No intake or output data in the 24 hours ending 09/27/18 0833 Filed Weights   09/19/18 2011 09/20/18 1055  Weight: 61.2 kg 64.7 kg    Examination: General exam: Afebrile, no chest pain, no nausea, breathing a lot easier and oriented x2 repeated no nausea vomiting demonstrated. Respiratory system: No using accessory muscles, normal respiratory effort, positive rhonchi.,  No crackles. Cardiovascular system:RRR. No murmurs, rubs, gallops. Gastrointestinal system: Abdomen is slightly distended; no tenderness to palpation appreciated; positive bowel sounds.  No guarding.   Central nervous system: Alert and oriented. No focal neurological deficits. Extremities: Chronic trace edema bilaterally; no cyanosis or clubbing. Skin: No rashes, lesions or ulcers Psychiatry: Judgement and insight appear to be impaired in the setting of sepsis, bacteremia, medications and underlying hepatic encephalopathy from cirrhosis.  Flat affect.  Data Reviewed: I  have personally reviewed following labs and imaging studies  CBC: Recent Labs  Lab 09/21/18 0546 09/22/18 0427 09/24/18 0213 09/25/18 0512  WBC 7.9 11.3* 13.8* 11.8*  NEUTROABS  --  9.4*  --   --   HGB 8.2* 8.2* 8.2* 7.6*  HCT 24.3* 24.3* 25.3* 22.9*  MCV 94.6 92.7 97.7 94.6  PLT 53* 56* 64* 61*   Basic Metabolic Panel: Recent Labs  Lab 09/21/18 0546 09/22/18 0427 09/23/18 1757 09/24/18 0213 09/25/18 0512  NA 129* 133* 138 142 144  K 4.1 3.9 3.8 3.6 3.4*  CL 103 106 115* 115* 117*  CO2 16* 18* 16* 18* 18*  GLUCOSE 99 93 90 113* 130*  BUN 37* 40* 36* 36* 27*  CREATININE 2.40* 2.18* 1.52* 1.41* 1.17  CALCIUM 7.4* 7.4* 7.6* 7.7* 7.6*  MG  --  1.9  --   --   --    GFR: Estimated Creatinine Clearance: 54.8 mL/min (by C-G formula based on SCr of 1.17 mg/dL).   Liver Function Tests: Recent Labs  Lab 09/21/18 0546 09/22/18 0427 09/23/18 1757 09/24/18 0213  AST 30 27 27 31   ALT 12 11 10 11   ALKPHOS 94 103 134* 152*  BILITOT 1.6* 1.0 1.3* 1.1  PROT 5.2* 4.9* 5.3* 5.6*  ALBUMIN 1.3* 1.2* 1.4* 1.4*    Recent Labs  Lab 09/20/18 0930 09/21/18 0546 09/23/18 1757 09/24/18 0214  AMMONIA 111* 114* 11 17   Cardiac Enzymes: Recent Labs  Lab 09/22/18 0427  CKTOTAL 12*   CBG: Recent Labs  Lab 09/25/18 1705 09/26/18 2128 09/27/18 0029 09/27/18 0626 09/27/18 0802  GLUCAP 99 72 106* 88 84   Sepsis Labs: Recent Labs  Lab 09/20/18 0905 09/20/18 1157  LATICACIDVEN 2.8* 2.6*    Recent Results (from the past 240 hour(s))  Culture, blood (routine x 2)     Status: Abnormal   Collection Time: 09/20/18  7:25 AM  Result Value Ref Range Status   Specimen Description BLOOD RIGHT ANTECUBITAL  Final   Special Requests   Final    BOTTLES DRAWN AEROBIC AND ANAEROBIC Blood Culture results may not be optimal due to an excessive volume of blood received in culture bottles   Culture  Setup Time   Final    IN BOTH AEROBIC AND ANAEROBIC BOTTLES GRAM POSITIVE COCCI  CRITICAL RESULT CALLED TO, READ BACK BY AND VERIFIED WITH: G. ABBOTT,PHARMD 0226 09/21/2018 T. TYSOR Performed at [facility], [place] [address] [place], [place] [address]    Culture METHICILLIN RESISTANT STAPHYLOCOCCUS AUREUS (A)  Final   Report Status 09/22/2018 FINAL  Final   Organism ID, Bacteria METHICILLIN RESISTANT STAPHYLOCOCCUS AUREUS  Final      Susceptibility   Methicillin resistant staphylococcus aureus - MIC*    CIPROFLOXACIN <=0.5 SENSITIVE Sensitive     ERYTHROMYCIN >=8 RESISTANT Resistant     GENTAMICIN <=0.5 SENSITIVE Sensitive     OXACILLIN RESISTANT Resistant     TETRACYCLINE <=1 SENSITIVE Sensitive     VANCOMYCIN 1 SENSITIVE Sensitive     TRIMETH/SULFA <=10 SENSITIVE Sensitive     CLINDAMYCIN <=0.25 SENSITIVE Sensitive     RIFAMPIN <=0.5 SENSITIVE Sensitive     Inducible Clindamycin NEGATIVE Sensitive     * METHICILLIN RESISTANT STAPHYLOCOCCUS AUREUS  Blood Culture ID Panel (Reflexed)     Status: Abnormal   Collection Time: 09/20/18  7:25 AM  Result Value Ref Range Status   Enterococcus species NOT DETECTED NOT DETECTED Final   Listeria  monocytogenes NOT DETECTED NOT DETECTED Final   Staphylococcus species DETECTED (A) NOT DETECTED Final    Comment: CRITICAL RESULT CALLED TO, READ BACK BY AND VERIFIED WITH: Nadean Corwin 0093 09/21/2018 T. TYSOR    Staphylococcus aureus (BCID) DETECTED (A) NOT DETECTED Final    Comment: Methicillin (oxacillin)-resistant Staphylococcus aureus (MRSA). MRSA is predictably resistant to beta-lactam antibiotics (except ceftaroline). Preferred therapy is vancomycin unless clinically contraindicated. Patient requires contact precautions if  hospitalized. CRITICAL RESULT CALLED TO, READ BACK BY AND VERIFIED WITH: G. ABBOTT,PHARMD 0226 09/21/2018 T. TYSOR    Methicillin resistance DETECTED (A) NOT DETECTED Final    Comment: CRITICAL RESULT CALLED TO, READ BACK BY AND VERIFIED WITH: G. ABBOTT,PHARMD 0226 09/21/2018 T. TYSOR     Streptococcus species NOT DETECTED NOT DETECTED Final   Streptococcus agalactiae NOT DETECTED NOT DETECTED Final   Streptococcus pneumoniae NOT DETECTED NOT DETECTED Final   Streptococcus pyogenes NOT DETECTED NOT DETECTED Final   Acinetobacter baumannii NOT DETECTED NOT DETECTED Final   Enterobacteriaceae species NOT DETECTED NOT DETECTED Final   Enterobacter cloacae complex NOT DETECTED NOT DETECTED Final   Escherichia coli NOT DETECTED NOT DETECTED Final   Klebsiella oxytoca NOT DETECTED NOT DETECTED Final   Klebsiella pneumoniae NOT DETECTED NOT DETECTED Final   Proteus species NOT DETECTED NOT DETECTED Final   Serratia marcescens NOT DETECTED NOT DETECTED Final   Haemophilus influenzae NOT DETECTED NOT DETECTED Final   Neisseria meningitidis NOT DETECTED NOT DETECTED Final   Pseudomonas aeruginosa NOT DETECTED NOT DETECTED Final   Candida albicans NOT DETECTED NOT DETECTED Final   Candida glabrata NOT DETECTED NOT DETECTED Final   Candida krusei NOT DETECTED NOT DETECTED Final   Candida parapsilosis NOT DETECTED NOT DETECTED Final   Candida tropicalis NOT DETECTED NOT DETECTED Final    Comment: Performed at [facility], [place]. [address], [place], [place] [address]  Culture, blood (routine x 2)     Status: Abnormal   Collection Time: 09/20/18  7:34 AM  Result Value Ref Range Status   Specimen Description BLOOD LEFT ANTECUBITAL  Final   Special Requests   Final    BOTTLES DRAWN AEROBIC AND ANAEROBIC Blood Culture adequate volume   Culture  Setup Time   Final    IN BOTH AEROBIC AND ANAEROBIC BOTTLES GRAM POSITIVE COCCI CRITICAL RESULT CALLED TO, READ BACK BY AND VERIFIED WITH: G. ABBOTT,PHARMD 0226 09/21/2018 T. TYSOR    Culture (A)  Final    STAPHYLOCOCCUS AUREUS SUSCEPTIBILITIES PERFORMED ON PREVIOUS CULTURE WITHIN THE LAST 5 DAYS. Performed at [facility], [place] [address] [place], [place] [address]    Report Status 09/22/2018 FINAL  Final  SARS Coronavirus 2  (CEPHEID - Performed in [place] hospital lab), Hosp Order     Status: None   Collection Time: 09/20/18  7:34 AM  Result Value Ref Range Status   SARS Coronavirus 2 NEGATIVE NEGATIVE Final    Comment: (NOTE) If result is NEGATIVE SARS-CoV-2 target nucleic acids are NOT DETECTED. The SARS-CoV-2 RNA is generally detectable in upper and lower  respiratory specimens during the acute phase of infection. The lowest  concentration of SARS-CoV-2 viral copies this assay can detect is 250  copies / mL. A negative result does not preclude SARS-CoV-2 infection  and should not be used as the sole basis for treatment or other  patient management decisions.  A negative result may occur with  improper specimen collection / handling, submission of specimen other  than  nasopharyngeal swab, presence of viral mutation(s) within the  areas targeted by this assay, and inadequate number of viral copies  (<250 copies / mL). A negative result must be combined with clinical  observations, patient history, and epidemiological information. If result is POSITIVE SARS-CoV-2 target nucleic acids are DETECTED. The SARS-CoV-2 RNA is generally detectable in upper and lower  respiratory specimens dur ing the acute phase of infection.  Positive  results are indicative of active infection with SARS-CoV-2.  Clinical  correlation with patient history and other diagnostic information is  necessary to determine patient infection status.  Positive results do  not rule out bacterial infection or co-infection with other viruses. If result is PRESUMPTIVE POSTIVE SARS-CoV-2 nucleic acids MAY BE PRESENT.   A presumptive positive result was obtained on the submitted specimen  and confirmed on repeat testing.  While 2019 novel coronavirus  (SARS-CoV-2) nucleic acids may be present in the submitted sample  additional confirmatory testing may be necessary for epidemiological  and / or clinical management purposes  to differentiate  between  SARS-CoV-2 and other Sarbecovirus currently known to infect humans.  If clinically indicated additional testing with an alternate test  methodology [id number]) is advised. The SARS-CoV-2 RNA is generally  detectable in upper and lower respiratory sp ecimens during the acute  phase of infection. The expected result is Negative. Fact Sheet for Patients:  StrictlyIdeas.no Fact Sheet for Healthcare Providers: BankingDealers.co.za This test is not yet approved or cleared by the [place] FDA and has been authorized for detection and/or diagnosis of SARS-CoV-2 by FDA under an Emergency Use Authorization (EUA).  This EUA will remain in effect (meaning this test can be used) for the duration of the COVID-19 declaration under Section 564(b)(1) of the Act, 21 U.S.C. section 360bbb-3(b)(1), unless the authorization is terminated or revoked sooner. Performed at [facility], [place] [address], [place], [place] [address]   Culture, blood (routine x 2)     Status: None   Collection Time: 09/21/18 12:23 PM  Result Value Ref Range Status   Specimen Description BLOOD LEFT ANTECUBITAL  Final   Special Requests   Final    BOTTLES DRAWN AEROBIC ONLY Blood Culture results may not be optimal due to an inadequate volume of blood received in culture bottles   Culture   Final    NO GROWTH 5 DAYS Performed at [facility], [place] [address] [place], [place] [address]    Report Status 09/26/2018 FINAL  Final  Culture, blood (routine x 2)     Status: None   Collection Time: 09/21/18  3:45 PM  Result Value Ref Range Status   Specimen Description BLOOD RIGHT HAND  Final   Special Requests   Final    BOTTLES DRAWN AEROBIC ONLY Blood Culture results may not be optimal due to an inadequate volume of blood received in culture bottles   Culture   Final    NO GROWTH 5 DAYS Performed at [facility], [place] [address] [place], [place] [address]     Report Status 09/26/2018 FINAL  Final     Radiology Studies: [place] Ekg Site Rite  Result Date: 09/25/2018 If Site Rite image not attached, placement could not be confirmed due to current cardiac rhythm.   Scheduled Meds: . amoxicillin-clavulanate  1 tablet Oral Q12H  . cloNIDine  0.2 mg Transdermal Weekly  . diazepam  2.5 mg Oral BID  . feeding supplement (ENSURE ENLIVE)  237 mL Oral TID BM  .  folic acid  1 mg Oral Daily  . lactulose  30 g Oral TID  . linezolid  600 mg Oral Q12H  .  morphine injection  1 mg Intravenous Q6H  . multivitamin with minerals  1 tablet Oral Daily  . nadolol  20 mg Oral Daily  . thiamine  100 mg Oral Daily   Continuous Infusions: . ampicillin-sulbactam (UNASYN) IV    . DAPTOmycin (CUBICIN)  IV 500 mg (09/24/18 2057)  . dextrose 5 % and 0.9% NaCl Stopped (09/25/18 0800)     LOS: 7 days    Barton Dubois, MD [phone]  [facility] 09/27/2018, 8:33 AM

## 2018-09-27 NOTE — Progress Notes (Addendum)
Modified Barium Swallow Progress Note  Patient Details  Name: Mark [name] MRN: [id number] Date of Birth: [birth year]  Today's Date: 09/27/2018  Modified Barium Swallow completed.  Full report located under Chart Review in the Imaging Section.  Brief recommendations include the following:  Clinical Impression  Patient presents with mild oral dysphagia characterized by decreased oral coordination and premature spillage.  Pharyngeal swallow is strong but did allow laryngeal penetration of thin x3 - once to cords (silent) with tsp amount and twice into laryngeal vestibule due to decreased timing of laryngeal closure.  Penetrates cleared with further swallows and pt was adequately protecting airway with sequential boluses of thin.  Pharyngeal swallow is strong without residuals.  Recommend Dys3/thin with precautions.  Pt needs to be fully alert and sitting upright for all po intake.  Esophageal sweep did not reveal any gross abnormalities but this test is not designed to diagnosis in esophagus.     Of note, pt became frustrated at end of test because he wanted a beer and became agitated (cussing) after informed repeatedly that staff could not obtain one for him. He perseverated significantly and could not be redirected. Pt did NOT cough during MBS.   Swallow Evaluation Recommendations       SLP Diet Recommendations: Dysphagia 3 (Mech soft) solids;Thin liquid   Liquid Administration via: Cup;Straw   Medication Administration: Whole meds with puree   Supervision: Patient able to self feed   Compensations: Slow rate;Small sips/bites   Postural Changes: Seated upright at 90 degrees   Oral Care Recommendations: Oral care BID      Luanna Salk, MS [place] Pager [phone] Office [phone]   Macario Golds 09/27/2018,9:18 AM

## 2018-09-28 ENCOUNTER — Encounter (HOSPITAL_COMMUNITY): Payer: Self-pay | Admitting: *Deleted

## 2018-09-28 LAB — GLUCOSE, CAPILLARY
Glucose-Capillary: 100 mg/dL — ABNORMAL HIGH (ref 70–99)
Glucose-Capillary: 105 mg/dL — ABNORMAL HIGH (ref 70–99)
Glucose-Capillary: 113 mg/dL — ABNORMAL HIGH (ref 70–99)
Glucose-Capillary: 120 mg/dL — ABNORMAL HIGH (ref 70–99)
Glucose-Capillary: 153 mg/dL — ABNORMAL HIGH (ref 70–99)

## 2018-09-28 MED ORDER — PRO-STAT SUGAR FREE PO LIQD
30.0000 mL | Freq: Three times a day (TID) | ORAL | Status: DC
  Administered 2018-09-28 – 2018-10-04 (×13): 30 mL via ORAL
  Filled 2018-09-28 (×11): qty 30

## 2018-09-28 MED ORDER — BOOST / RESOURCE BREEZE PO LIQD CUSTOM
1.0000 | Freq: Two times a day (BID) | ORAL | Status: DC
  Administered 2018-09-28 – 2018-10-04 (×11): 1 via ORAL

## 2018-09-28 MED ORDER — INSULIN ASPART 100 UNIT/ML ~~LOC~~ SOLN
0.0000 [IU] | Freq: Three times a day (TID) | SUBCUTANEOUS | Status: DC
  Administered 2018-10-03 – 2018-10-04 (×2): 1 [IU] via SUBCUTANEOUS

## 2018-09-28 MED ORDER — ENSURE ENLIVE PO LIQD
237.0000 mL | Freq: Two times a day (BID) | ORAL | Status: DC
  Administered 2018-09-28 – 2018-10-04 (×11): 237 mL via ORAL

## 2018-09-28 MED ORDER — SACCHAROMYCES BOULARDII 250 MG PO CAPS
250.0000 mg | ORAL_CAPSULE | Freq: Two times a day (BID) | ORAL | Status: DC
  Administered 2018-09-28 – 2018-10-04 (×11): 250 mg via ORAL
  Filled 2018-09-28 (×11): qty 1

## 2018-09-28 NOTE — Plan of Care (Signed)
  Problem: Education: Goal: Knowledge of General Education information will improve Description: Including pain rating scale, medication(s)/side effects and non-pharmacologic comfort measures Outcome: Progressing   Problem: Health Behavior/Discharge Planning: Goal: Ability to manage health-related needs will improve Outcome: Progressing   Problem: Safety: Goal: Ability to remain free from injury will improve Outcome: Progressing   

## 2018-09-28 NOTE — Progress Notes (Signed)
Patient ID: Mark [name], male   DOB: 1955-10-31, 63 y.o.   MRN: [id number]  PROGRESS NOTE    Brier Reid  FUX:[id number] DOB: 03-Nov-1955 DOA: 09/19/2018 PCP: [name], MD   Brief Narrative:  63 year old male with history of alcoholic liver cirrhosis, pancytopenia, hypertension, coronary artery disease, polysubstance abuse (alcohol, heroin, marijuana), CKD stage III and MSSA bacteremia in 04/2018 presented with worsening lower back pain.  COVID-19 rapid testing was negative on admission.  MRI of the lumbar spine revealed enhancement throughout the epidural space of L3 through the sacrum concerning for possible phlegmon/abscess, L5-S1 marrow enhancement concerning for septic arthritis, and spinal stenosis L3-S1. He was started on broad-spectrum antibiotics.  Neurosurgery was consulted.  Assessment & Plan:   Principal Problem:   MRSA bacteremia Active Problems:   Cirrhosis, alcoholic ([place])   Polysubstance abuse ([place])   Hyponatremia   Pancytopenia ([name])   Hepatic encephalopathy ([name])   Alcohol abuse   Septic arthritis ([name])   Anemia of chronic disease   Hypoalbuminemia   Hypoglycemia without diagnosis of diabetes mellitus   Epidural abscess   Abscess in epidural space of lumbar spine   Aspiration pneumonia ([name])   Septic arthritis/epidural abscess of lumbar spine -MRI of the lumbar spine revealed enhancement throughout the epidural space of L3 through the sacrum concerning for possible phlegmon/abscess, L5-S1 marrow enhancement concerning for septic arthritis, and spinal stenosis L3-S1 -Patient was initially started on broad-spectrum antibiotics vancomycin and Zosyn.  Patient has been subsequently switched to daptomycin per ID. -Neurosurgery has been consulted and had recommended IR consultation for probable drainage/biopsy.  As per IR, the abscess/collection was not large enough to aspirate or drain.  IR has notified Dr. Zada Finders. -Neurosurgery found patient to  be high risk for any surgical intervention and is recommended conservative management with IV antibiotics only. -No evidence of loss of bowel or bladder control for now -Continue pain management and supportive care. -Patient remains afebrile. -Planning for a total of 14 days of IV daptomycin; with subsequent 4 weeks of doxycycline. -Given issues with IV access patient received 2 doses of Augmentin and 2 doses of Zyvox on 09/26/18. Now back to daptomycin.   MRSA bacteremia -Susceptibilities pending.  ID has been consulted automatically.  Currently on daptomycin.   -Repeat blood cultures without any growth. -2D echo: Preserved ejection fraction, no wall motion normalities, no vegetations appreciated on any valve.  Mild regurgitation and he tricuspid valve was appreciated by color flow Doppler.. -As mentioned above patient will receive 14 days of daptomycin with subsequent 4 weeks of doxycycline. -Continue supportive care.  Acute kidney injury with metabolic acidosis -Baseline creatinine of 0.95 on 08/22/2018.  Patient on Lasix and spironolactone at home which is on hold. -Presented with creatinine of 1.72.  Creatinine has worsened at 2.4 -Continue IV fluids; and encourage increase oral intake. -Creatinine down to 1.17 -Follow renal function trend -Continue strict input and output. -Renal ultrasound, demonstrated no acute obstructive uropathy.  Alcoholic cirrhosis with hepatic encephalopathy -Continue lactulose; but tailor regimen to achieve 2-3 stools per day and avoid severe diarrhea.. -Diuretics remains on hold. -Continue beta-blocker (nadolol)  Polysubstance abuse -Patient admits to alcohol and recent heroin use prior to arrival -Continue CIWA protocol.  Social worker consult.  Continue multivitamin, thiamine and folate. -Patient is confused and lethargic this morning. -Ammonia level is low -Be careful with use of sedatives because of history of alcoholic  cirrhosis.  Hyponatremia -Sodium 130 on admission. With a chronic component given cirrhosis.  -  Continue gentle fluid resuscitation and supportive care. -last Na 144; will repeat BMET in am.  Hypoalbuminemia -Follow recommendations by nutritional service -Will use feeding supplements.  Generalized deconditioning -Overall prognosis is very poor.  Patient is noncompliant and still actively using drugs.   -Appreciate assistance and recommendations by palliative care; will continue medications as prescribed.  Continue IV antibiotics and supportive care. -Patient is DNR/DNI; looking to maintain quality and avoiding heroic invasive measurements.  -Continue treating what is treatable.  Shortness of breath/multifocal pneumonia -In the setting of aspiration -Continue treatment with augmentin now. Previously received 3 days of unasyn. -Plan is for a total of 8 days treatment.  Dysphagia -In the setting of acute encephalopathy -speech therapy has seen patient and recommended dysphagia 1 with thin liquids.  DVT prophylaxis: SCDs  Code Status: Full Family Communication: None at bedside Disposition Plan: Patient not a candidate for IV antibiotic therapy given prior history of substance abuse.  Per infectious disease doctor plan P.o. for 14 days of IV antibiotics using daptomycin and subsequently 4 weeks of doxycycline.  For his aspiration pneumonia component will treat with a total of 8 days of augmentin.  Consultants:  Neurosurgery ID consulted automatically because of positive blood cultures Palliative care IR: For placement of PICC.  Procedures:  See below for x-ray reports 2D echo: IMPRESSIONS 1. The left ventricle has normal systolic function with an ejection fraction of 60-65%. The cavity size was normal. There is mildly increased left ventricular wall thickness. Left ventricular diastolic parameters were normal.  2. The right ventricle has normal systolic function. The cavity was  normal. There is no increase in right ventricular wall thickness.  3. Left atrial size was severely dilated.  4. No evidence of mitral valve stenosis.  5. Mild thickening of the aortic valve. Mild calcification of the aortic valve. No stenosis of the aortic valve.  Antimicrobials: 1 dose of vancomycin Zosyn and Ancef on 09/20/2018 -Daptomycin from 09/21/2018 onwards   Subjective: No fever, no chest pain, no nausea, no vomiting.  Patient reports no shortness of breath currently.  Having some loose stools and still with altered mentation's and now back to baseline.  Objective: Vitals:   09/27/18 2250 09/28/18 0432 09/28/18 1035 09/28/18 1240  BP: (!) 146/54 (!) 126/51 137/72 (!) 155/66  Pulse: (!) 54 60 67 (!) 57  Resp: _0 Temp: 97.8 F (36.6 C) (!) 97.5 F (36.4 C)  97.6 F (36.4 C)  TempSrc: Oral Oral  Oral  SpO2:  100% 100% 100%  Weight:      Height:        Intake/Output Summary (Last 24 hours) at 09/28/2018 1608 Last data filed at 09/28/2018 1020 Gross per 24 hour  Intake 350 ml  Output 600 ml  Net -250 ml   Filed Weights   09/19/18 2011 09/20/18 1055  Weight: 61.2 kg 64.7 kg    Examination: General exam: Alert, awake, oriented x 2; no chest pain, no nausea, no vomiting.  Reports breathing is improved and is no requiring oxygen supplementation.  Having some diarrhea and complaining about rectal tube.  No fever Respiratory system: Positive rhonchi. Respiratory effort normal. Cardiovascular system:RRR. No murmurs, rubs, gallops. Gastrointestinal system: Abdomen is nondistended, soft and nontender. No organomegaly or masses felt. Normal bowel sounds heard. Central nervous system: Alert and oriented. No focal neurological deficits. Extremities: No cyanosis or clubbing.  Trace edema appreciated bilaterally. Skin: No rashes, lesions or ulcers Psychiatry: Judgement and insight appear impair for underlying  encephalopathy. Mood & affect appropriate.    Data Reviewed: I  have personally reviewed following labs and imaging studies  CBC: Recent Labs  Lab 09/22/18 0427 09/24/18 0213 09/25/18 0512  WBC 11.3* 13.8* 11.8*  NEUTROABS 9.4*  --   --   HGB 8.2* 8.2* 7.6*  HCT 24.3* 25.3* 22.9*  MCV 92.7 97.7 94.6  PLT 56* 64* 61*   Basic Metabolic Panel: Recent Labs  Lab 09/22/18 0427 09/23/18 1757 09/24/18 0213 09/25/18 0512  NA 133* 138 142 144  K 3.9 3.8 3.6 3.4*  CL 106 115* 115* 117*  CO2 18* 16* 18* 18*  GLUCOSE 93 90 113* 130*  BUN 40* 36* 36* 27*  CREATININE 2.18* 1.52* 1.41* 1.17  CALCIUM 7.4* 7.6* 7.7* 7.6*  MG 1.9  --   --   --    GFR: Estimated Creatinine Clearance: 54.8 mL/min (by C-G formula based on SCr of 1.17 mg/dL).   Liver Function Tests: Recent Labs  Lab 09/22/18 0427 09/23/18 1757 09/24/18 0213  AST _0 ALT _1 ALKPHOS 103 134* 152*  BILITOT 1.0 1.3* 1.1  PROT 4.9* 5.3* 5.6*  ALBUMIN 1.2* 1.4* 1.4*    Recent Labs  Lab 09/23/18 1757 09/24/18 0214  AMMONIA 11 17   Cardiac Enzymes: Recent Labs  Lab 09/22/18 0427  CKTOTAL 12*   CBG: Recent Labs  Lab 09/27/18 1728 09/27/18 2027 09/28/18 0039 09/28/18 0434 09/28/18 0809  GLUCAP 109* 138* 153* 113* 120*   Sepsis Labs: No results for input(s): PROCALCITON, LATICACIDVEN in the last 168 hours.  Recent Results (from the past 240 hour(s))  Culture, blood (routine x 2)     Status: Abnormal   Collection Time: 09/20/18  7:25 AM  Result Value Ref Range Status   Specimen Description BLOOD RIGHT ANTECUBITAL  Final   Special Requests   Final    BOTTLES DRAWN AEROBIC AND ANAEROBIC Blood Culture results may not be optimal due to an excessive volume of blood received in culture bottles   Culture  Setup Time   Final    IN BOTH AEROBIC AND ANAEROBIC BOTTLES GRAM POSITIVE COCCI CRITICAL RESULT CALLED TO, READ BACK BY AND VERIFIED WITH: Salli Real [id number] 09/21/2018 Mena Goes Performed at [facility], [place] [address] [place], [place]  [address]    Culture METHICILLIN RESISTANT STAPHYLOCOCCUS AUREUS (A)  Final   Report Status 09/22/2018 FINAL  Final   Organism ID, Bacteria METHICILLIN RESISTANT STAPHYLOCOCCUS AUREUS  Final      Susceptibility   Methicillin resistant staphylococcus aureus - MIC*    CIPROFLOXACIN <=0.5 SENSITIVE Sensitive     ERYTHROMYCIN >=8 RESISTANT Resistant     GENTAMICIN <=0.5 SENSITIVE Sensitive     OXACILLIN RESISTANT Resistant     TETRACYCLINE <=1 SENSITIVE Sensitive     VANCOMYCIN 1 SENSITIVE Sensitive     TRIMETH/SULFA <=10 SENSITIVE Sensitive     CLINDAMYCIN <=0.25 SENSITIVE Sensitive     RIFAMPIN <=0.5 SENSITIVE Sensitive     Inducible Clindamycin NEGATIVE Sensitive     * METHICILLIN RESISTANT STAPHYLOCOCCUS AUREUS  Blood Culture ID Panel (Reflexed)     Status: Abnormal   Collection Time: 09/20/18  7:25 AM  Result Value Ref Range Status   Enterococcus species NOT DETECTED NOT DETECTED Final   Listeria monocytogenes NOT DETECTED NOT DETECTED Final   Staphylococcus species DETECTED (A) NOT DETECTED Final    Comment: CRITICAL RESULT CALLED TO, READ BACK BY AND VERIFIED WITH: Nadean Corwin [id number]  09/21/2018 T. TYSOR    Staphylococcus aureus (BCID) DETECTED (A) NOT DETECTED Final    Comment: Methicillin (oxacillin)-resistant Staphylococcus aureus (MRSA). MRSA is predictably resistant to beta-lactam antibiotics (except ceftaroline). Preferred therapy is vancomycin unless clinically contraindicated. Patient requires contact precautions if  hospitalized. CRITICAL RESULT CALLED TO, READ BACK BY AND VERIFIED WITH: G. ABBOTT,PHARMD 0226 09/21/2018 T. TYSOR    Methicillin resistance DETECTED (A) NOT DETECTED Final    Comment: CRITICAL RESULT CALLED TO, READ BACK BY AND VERIFIED WITH: G. ABBOTT,PHARMD 0226 09/21/2018 T. TYSOR    Streptococcus species NOT DETECTED NOT DETECTED Final   Streptococcus agalactiae NOT DETECTED NOT DETECTED Final   Streptococcus pneumoniae NOT DETECTED NOT DETECTED Final    Streptococcus pyogenes NOT DETECTED NOT DETECTED Final   Acinetobacter baumannii NOT DETECTED NOT DETECTED Final   Enterobacteriaceae species NOT DETECTED NOT DETECTED Final   Enterobacter cloacae complex NOT DETECTED NOT DETECTED Final   Escherichia coli NOT DETECTED NOT DETECTED Final   Klebsiella oxytoca NOT DETECTED NOT DETECTED Final   Klebsiella pneumoniae NOT DETECTED NOT DETECTED Final   Proteus species NOT DETECTED NOT DETECTED Final   Serratia marcescens NOT DETECTED NOT DETECTED Final   Haemophilus influenzae NOT DETECTED NOT DETECTED Final   Neisseria meningitidis NOT DETECTED NOT DETECTED Final   Pseudomonas aeruginosa NOT DETECTED NOT DETECTED Final   Candida albicans NOT DETECTED NOT DETECTED Final   Candida glabrata NOT DETECTED NOT DETECTED Final   Candida krusei NOT DETECTED NOT DETECTED Final   Candida parapsilosis NOT DETECTED NOT DETECTED Final   Candida tropicalis NOT DETECTED NOT DETECTED Final    Comment: Performed at [facility], [name]. [address] [place], [place] [address]  Culture, blood (routine x 2)     Status: Abnormal   Collection Time: 09/20/18  7:34 AM  Result Value Ref Range Status   Specimen Description BLOOD LEFT ANTECUBITAL  Final   Special Requests   Final    BOTTLES DRAWN AEROBIC AND ANAEROBIC Blood Culture adequate volume   Culture  Setup Time   Final    IN BOTH AEROBIC AND ANAEROBIC BOTTLES GRAM POSITIVE COCCI CRITICAL RESULT CALLED TO, READ BACK BY AND VERIFIED WITH: G. ABBOTT,PHARMD 0226 09/21/2018 T. TYSOR    Culture (A)  Final    STAPHYLOCOCCUS AUREUS SUSCEPTIBILITIES PERFORMED ON PREVIOUS CULTURE WITHIN THE LAST 5 DAYS. Performed at [facility], [place] [address] [place], [place] [address]    Report Status 09/22/2018 FINAL  Final  SARS Coronavirus 2 (CEPHEID - Performed in [facility] hospital lab), Hosp Order     Status: None   Collection Time: 09/20/18  7:34 AM  Result Value Ref Range Status   SARS Coronavirus 2  NEGATIVE NEGATIVE Final    Comment: (NOTE) If result is NEGATIVE SARS-CoV-2 target nucleic acids are NOT DETECTED. The SARS-CoV-2 RNA is generally detectable in upper and lower  respiratory specimens during the acute phase of infection. The lowest  concentration of SARS-CoV-2 viral copies this assay can detect is 250  copies / mL. A negative result does not preclude SARS-CoV-2 infection  and should not be used as the sole basis for treatment or other  patient management decisions.  A negative result may occur with  improper specimen collection / handling, submission of specimen other  than nasopharyngeal swab, presence of viral mutation(s) within the  areas targeted by this assay, and inadequate number of viral copies  (<250 copies / mL). A negative result must be combined with  clinical  observations, patient history, and epidemiological information. If result is POSITIVE SARS-CoV-2 target nucleic acids are DETECTED. The SARS-CoV-2 RNA is generally detectable in upper and lower  respiratory specimens dur ing the acute phase of infection.  Positive  results are indicative of active infection with SARS-CoV-2.  Clinical  correlation with patient history and other diagnostic information is  necessary to determine patient infection status.  Positive results do  not rule out bacterial infection or co-infection with other viruses. If result is PRESUMPTIVE POSTIVE SARS-CoV-2 nucleic acids MAY BE PRESENT.   A presumptive positive result was obtained on the submitted specimen  and confirmed on repeat testing.  While 2019 novel coronavirus  (SARS-CoV-2) nucleic acids may be present in the submitted sample  additional confirmatory testing may be necessary for epidemiological  and / or clinical management purposes  to differentiate between  SARS-CoV-2 and other Sarbecovirus currently known to infect humans.  If clinically indicated additional testing with an alternate test  methodology [id number])  is advised. The SARS-CoV-2 RNA is generally  detectable in upper and lower respiratory sp ecimens during the acute  phase of infection. The expected result is Negative. Fact Sheet for Patients:  StrictlyIdeas.no Fact Sheet for Healthcare Providers: BankingDealers.co.za This test is not yet approved or cleared by the [place] FDA and has been authorized for detection and/or diagnosis of SARS-CoV-2 by FDA under an Emergency Use Authorization (EUA).  This EUA will remain in effect (meaning this test can be used) for the duration of the COVID-19 declaration under Section 564(b)(1) of the Act, 21 U.S.C. section 360bbb-3(b)(1), unless the authorization is terminated or revoked sooner. Performed at [facility], [place] [address] [place], [place] [address]   Culture, blood (routine x 2)     Status: None   Collection Time: 09/21/18 12:23 PM  Result Value Ref Range Status   Specimen Description BLOOD LEFT ANTECUBITAL  Final   Special Requests   Final    BOTTLES DRAWN AEROBIC ONLY Blood Culture results may not be optimal due to an inadequate volume of blood received in culture bottles   Culture   Final    NO GROWTH 5 DAYS Performed at [facility], [place] [address], [place], [place] [address]    Report Status 09/26/2018 FINAL  Final  Culture, blood (routine x 2)     Status: None   Collection Time: 09/21/18  3:45 PM  Result Value Ref Range Status   Specimen Description BLOOD RIGHT HAND  Final   Special Requests   Final    BOTTLES DRAWN AEROBIC ONLY Blood Culture results may not be optimal due to an inadequate volume of blood received in culture bottles   Culture   Final    NO GROWTH 5 DAYS Performed at [facility], [place] [address] [place], [place] [address]    Report Status 09/26/2018 FINAL  Final     Radiology Studies: Dg Swallowing Func-speech Pathology  Result Date: 09/27/2018 Objective Swallowing Evaluation: Type  of Study: MBS-Modified Barium Swallow Study  Patient Details Name: Lenzy Kerschner MRN: [id number] Date of Birth: Nov 19, 1955 Today's Date: 09/27/2018 Time: SLP Start Time (ACUTE ONLY): 0831 -SLP Stop Time (ACUTE ONLY): 0855 SLP Time Calculation (min) (ACUTE ONLY): 24 min Past Medical History: Past Medical History: Diagnosis Date  Alcohol abuse   Anemia   Arthritis   Ascites   Cirrhosis ([place])   Coffee ground emesis   Dehydration 06/17/2017  Febrile illness   Heart murmur  Hyperlipidemia   Hypertension   Leg swelling   Myocardial infarction ([place]) 2012  Preop cardiovascular exam 04/14/2013  Sepsis ([place]) 06/17/2017  Septic shock ([place]) 06/18/2017  SIRS (systemic inflammatory response syndrome) ([place]) 07/11/2017  Stroke ([place]) 2012  no deficits  Thrombocytopenia ([place])  Past Surgical History: Past Surgical History: Procedure Laterality Date  COLONOSCOPY WITH PROPOFOL N/A 02/07/2016  Procedure: COLONOSCOPY WITH PROPOFOL;  Surgeon: Milus Banister, MD;  Location: [facility];  Service: Endoscopy;  Laterality: N/A;  ESOPHAGOGASTRODUODENOSCOPY (EGD) WITH PROPOFOL N/A 02/07/2016  Procedure: ESOPHAGOGASTRODUODENOSCOPY (EGD) WITH PROPOFOL;  Surgeon: Milus Banister, MD;  Location: [facility];  Service: Endoscopy;  Laterality: N/A;  ESOPHAGOGASTRODUODENOSCOPY (EGD) WITH PROPOFOL N/A 06/22/2017  Procedure: ESOPHAGOGASTRODUODENOSCOPY (EGD) WITH PROPOFOL;  Surgeon: Jerene Bears, MD;  Location: [facility] ENDOSCOPY;  Service: Gastroenterology;  Laterality: N/A;  HERNIA REPAIR Right   inguinal  KNEE ARTHROSCOPY    bilateral/  12/14  TEE WITHOUT CARDIOVERSION N/A 05/13/2018  Procedure: TRANSESOPHAGEAL ECHOCARDIOGRAM (TEE);  Surgeon: [name], MD;  Location: [facility];  Service: Cardiovascular;  Laterality: N/A;  TOTAL KNEE ARTHROPLASTY Right 05/02/2013  Procedure: RIGHT TOTAL KNEE ARTHROPLASTY;  Surgeon: Mauri Pole, MD;  Location: [facility];  Service: Orthopedics;  Laterality: Right; HPI: 63 yo male adm to  [facility] with h/o polysubstance abuse and ETOH/cirrohisis with lumbar spine abscess and MRSA bacteremia.  Neuro surgery and ID following.  Pt with possible/probable aspiration episode and SLP swallow eval was ordered.  CXR showed lower lobe infiltrates concerning for aspiration.  Pt also had episode of vomiting bile type material.   Subjective: pt awake in chair Assessment / Plan / Recommendation CHL IP CLINICAL IMPRESSIONS 09/27/2018 Clinical Impression Patient presents with mild oral dysphagia characterized by decreased oral coordination and premature spillage.  Pharyngeal swallow is strong but did allow laryngeal penetration of thin x3 - once to cords (silent) with tsp amount and twice into laryngeal vestibule due to decreased timing of laryngeal closure.  Penetrates cleared with further swallows and pt was adequately protecting airway with sequential boluses of thin.  Pharyngeal swallow is strong without residuals.  Recommend Dys3/thin with precautions.  Pt needs to be fully alert and sitting upright for all po intake.  Esophageal sweep did not reveal any gross abnormalities but this test is not designed to diagnosis in esophagus. Of note, pt became frustrated at end of test because he wanted a beer and became agitated (cussing) after informed repeatedly that staff could not obtain one for him. He perseverated significantly and could not be redirected.   SLP Visit Diagnosis -- Attention and concentration deficit following -- Frontal lobe and executive function deficit following -- Impact on safety and function Mild aspiration risk   CHL IP TREATMENT RECOMMENDATION 09/27/2018 Treatment Recommendations Therapy as outlined in treatment plan below   Prognosis 09/27/2018 Prognosis for Safe Diet Advancement Fair Barriers to Reach Goals Cognitive deficits Barriers/Prognosis Comment -- CHL IP DIET RECOMMENDATION 09/27/2018 SLP Diet Recommendations Dysphagia 3 (Mech soft) solids;Thin liquid Liquid Administration via Cup;Straw  Medication Administration Whole meds with puree Compensations Slow rate;Small sips/bites Postural Changes Seated upright at 90 degrees   CHL IP OTHER RECOMMENDATIONS 09/27/2018 Recommended Consults -- Oral Care Recommendations Oral care BID Other Recommendations --   CHL IP FOLLOW UP RECOMMENDATIONS 09/26/2018 Follow up Recommendations Other (comment)   CHL IP FREQUENCY AND DURATION 09/27/2018 Speech Therapy Frequency (ACUTE ONLY) min 1 x/week Treatment Duration 1 week      CHL IP ORAL PHASE 09/27/2018 Oral Phase Impaired Oral -  Pudding Teaspoon -- Oral - Pudding Cup -- Oral - Honey Teaspoon -- Oral - Honey Cup -- Oral - Nectar Teaspoon -- Oral - Nectar Cup WFL Oral - Nectar Straw -- Oral - Thin Teaspoon -- Oral - Thin Cup Premature spillage Oral - Thin Straw WFL Oral - Puree WFL Oral - Mech Soft -- Oral - Regular WFL Oral - Multi-Consistency -- Oral - Pill Other (Comment) Oral Phase - Comment --  CHL IP PHARYNGEAL PHASE 09/27/2018 Pharyngeal Phase Impaired Pharyngeal- Pudding Teaspoon -- Pharyngeal -- Pharyngeal- Pudding Cup -- Pharyngeal -- Pharyngeal- Honey Teaspoon -- Pharyngeal -- Pharyngeal- Honey Cup -- Pharyngeal -- Pharyngeal- Nectar Teaspoon -- Pharyngeal -- Pharyngeal- Nectar Cup WFL Pharyngeal -- Pharyngeal- Nectar Straw WFL Pharyngeal -- Pharyngeal- Thin Teaspoon -- Pharyngeal -- Pharyngeal- Thin Cup Penetration/Aspiration during swallow Pharyngeal Material enters airway, CONTACTS cords and not ejected out;Material enters airway, remains ABOVE vocal cords then ejected out Pharyngeal- Thin Straw WFL Pharyngeal -- Pharyngeal- Puree -- Pharyngeal -- Pharyngeal- Mechanical Soft -- Pharyngeal -- Pharyngeal- Regular WFL Pharyngeal -- Pharyngeal- Multi-consistency -- Pharyngeal -- Pharyngeal- Pill -- Pharyngeal -- Pharyngeal Comment --  CHL IP CERVICAL ESOPHAGEAL PHASE 09/27/2018 Cervical Esophageal Phase WFL Pudding Teaspoon -- Pudding Cup -- Honey Teaspoon -- Honey Cup -- Nectar Teaspoon -- Nectar Cup -- Nectar Straw  -- Thin Teaspoon -- Thin Cup -- Thin Straw -- Puree -- Mechanical Soft -- Regular -- Multi-consistency -- Pill -- Cervical Esophageal Comment -- Macario Golds 09/27/2018, 9:21 AM  Mark Salk, MS [facility] SLP Acute Rehab Services Pager [phone] Office [phone]             Ir Picc Placement Right >5 Yrs Inc Img Guide  Result Date: 09/27/2018 INDICATION: Bacteremia, access for antibiotics EXAM: ULTRASOUND AND FLUOROSCOPIC GUIDED PICC LINE INSERTION MEDICATIONS: 1% lidocaine local CONTRAST:  None FLUOROSCOPY TIME:  5.0 minutes (27 mGy) COMPLICATIONS: None immediate. TECHNIQUE: The procedure, risks, benefits, and alternatives were explained to the patient's family and informed written consent was obtained. A timeout was performed prior to the initiation of the procedure. The right upper extremity was prepped with chlorhexidine in a sterile fashion, and a sterile drape was applied covering the operative field. Maximum barrier sterile technique with sterile gowns and gloves were used for the procedure. A timeout was performed prior to the initiation of the procedure. Local anesthesia was provided with 1% lidocaine. Under direct ultrasound guidance, the right brachial vein was accessed with a micropuncture kit after the overlying soft tissues were anesthetized with 1% lidocaine. An ultrasound image was saved for documentation purposes. A guidewire was eventually advanced to the level of the superior caval-atrial junction for measurement purposes and the PICC line was cut to length. A peel-away sheath was placed and a 28 cm, 5 [place], dual lumen was inserted to level of the superior caval-atrial junction. A post procedure spot fluoroscopic was obtained. The catheter easily aspirated and flushed and was sutured in place. A dressing was placed. The patient tolerated the procedure well without immediate post procedural complication. FINDINGS: After catheter placement, the tip lies within the superior cavoatrial  junction. The catheter aspirates and flushes normally and is ready for immediate use. IMPRESSION: Successful ultrasound and fluoroscopic guided placement of a right brachial vein approach, 28 cm, 5 French, dual lumen PICC with tip at the superior caval-atrial junction. The PICC line is ready for immediate use. Electronically Signed   By: Jerilynn Mages.  Shick M.D.   On: 09/27/2018 15:53    Scheduled Meds:  amoxicillin-clavulanate  1 tablet Oral Q12H   cloNIDine  0.2 mg Transdermal Weekly   diazepam  2.5 mg Oral BID   feeding supplement  1 Container Oral BID BM   feeding supplement (ENSURE ENLIVE)  237 mL Oral BID PC   feeding supplement (PRO-STAT SUGAR FREE 64)  30 mL Oral TID WC   folic acid  1 mg Oral Daily   lactulose  30 g Oral TID    morphine injection  1 mg Intravenous Q6H   multivitamin with minerals  1 tablet Oral Daily   nadolol  20 mg Oral Daily   sodium chloride flush  10-40 mL Intracatheter Q12H   thiamine  100 mg Oral Daily   Continuous Infusions:  DAPTOmycin (CUBICIN)  IV Stopped (09/27/18 2037)   dextrose 5 % and 0.9% NaCl 75 mL/hr at 09/28/18 0941     LOS: 8 days    Barton Dubois, MD [phone]  [facility] 09/28/2018, 4:08 PM

## 2018-09-28 NOTE — Progress Notes (Signed)
Nutrition Follow-up  RD working remotely.  DOCUMENTATION CODES:   Not applicable  INTERVENTION:   -Decrease Ensure Enlive po to BID, each supplement provides 350 kcal and 20 grams of protein -Boost Breeze po BID, each supplement provides 250 kcal and 9 grams of protein -30 ml Porstat TID, each supplement provides 100 kcals and 15 grams protein -Continue MVI with minerals daily -Feeding assistance with meals  NUTRITION DIAGNOSIS:   Increased nutrient needs related to chronic illness(alcoholic cirrhosis) as evidenced by estimated needs.  Ongoing  GOAL:   Patient will meet greater than or equal to 90% of their needs  Progressing  MONITOR:   PO intake, Supplement acceptance, Labs, Weight trends, Skin, I & O's  REASON FOR ASSESSMENT:   Consult Assessment of nutrition requirement/status, Diet education  ASSESSMENT:   Mark [name] is a 63 y.o. male with medical history significant of alcoholic liver cirrhosis, pancytopenia, HTN, CAD, polysubstance abuse(alcohol, heroin, marijuana), CKD stage III, and MSSA bacteremia in 04/2018; who presents with complaints of worsening lower back pain.  Pain is described as severe. Reports that he has had this pain intermittently for years, but has been more constant.  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  Denies having any significant fever, change in bowel habits, nausea, vomiting, diarrhea, shortness of breath, or chest pain.  He tried over-the-counter NSAIDs without relief.  Patient still reports drinking beer on a daily basis and last reports snorting heroin prior to coming into the emergency department.  In January patient was admitted for MSSA bacteremia and plan was to treat with 3 weeks of IV Ancef to be stopped originally on 1/27.  However, due to home health concerns of going to the patient's house IV antibiotics were discontinued on 1/24 with removal of the PICC line after  discussions with infectious disease.  Patient had just recently been seen in the emergency department for left lower extremity cellulitis on 4/26 treated with oral antibiotics of Bactrim and topical ketoconazole.  Denies having any issues with large left lower extremity at this time.  Lastly, he admits to eating normal, but states that he has been more sleepy where sometimes will not get up to eat.  5/30- s/p BSE- advanced to dysphagia 1 diet with nectar thick liquids 6/1- s/p MBSS- advanced to dysphagia 3 diet with thin liquids, s/p PICC placement  Reviewed I/O's: -290 ml x 24 hours and +12/4 L since admission  UOP: 400 ml x 24 hours  Per neurosurgery notes, pt is a poor surgical candidate and is refusing surgical interventions at this time. IR reports abscess not large enough to drain.  [name] mentation has improved, but remains lethargic at times. Meal completion 0-25%. Palliative care goals of care meeting note reviewed; plan to treat the treatable to prevent re-hospitalization as well as ICU and intubation. Family desires to take pt home at discharge.   Labs reviewed: CBGS: [id number]   Diet Order:   Diet Order            DIET DYS 3 Room service appropriate? No; Fluid consistency: Thin  Diet effective now              EDUCATION NEEDS:   No education needs have been identified at this time  Skin:  Skin Assessment: Reviewed RN Assessment  Last BM:  09/28/18 (via rectal pouch)  Height:   Ht Readings from Last 1 Encounters:  09/20/18 5\' 5"  (1.651 m)  Weight:   Wt Readings from Last 1 Encounters:  09/20/18 64.7 kg    Ideal Body Weight:  61.8 kg  BMI:  Body mass index is 23.74 kg/m.  Estimated Nutritional Needs:   Kcal:  2050-2250  Protein:  100-115 grams  Fluid:  > 2 L    Stone Spirito A. Jimmye Norman, RD, LDN, [place] Registered Dietitian II Certified Diabetes Care and Education Specialist Pager: [phone] After hours Pager: [phone]

## 2018-09-29 LAB — GLUCOSE, CAPILLARY
Glucose-Capillary: 107 mg/dL — ABNORMAL HIGH (ref 70–99)
Glucose-Capillary: 107 mg/dL — ABNORMAL HIGH (ref 70–99)
Glucose-Capillary: 121 mg/dL — ABNORMAL HIGH (ref 70–99)
Glucose-Capillary: 126 mg/dL — ABNORMAL HIGH (ref 70–99)
Glucose-Capillary: 143 mg/dL — ABNORMAL HIGH (ref 70–99)
Glucose-Capillary: 82 mg/dL (ref 70–99)

## 2018-09-29 LAB — CK: Total CK: 493 U/L — ABNORMAL HIGH (ref 49–397)

## 2018-09-29 LAB — CREATININE, SERUM
Creatinine, Ser: 0.91 mg/dL (ref 0.61–1.24)
GFR calc Af Amer: >60 mL/min (ref >60)
GFR calc non Af Amer: >60 mL/min (ref >60)

## 2018-09-29 MED ORDER — OXYCODONE HCL 5 MG PO TABS
10.0000 mg | ORAL_TABLET | Freq: Once | ORAL | Status: AC
  Administered 2018-09-29: 10 mg via ORAL
  Filled 2018-09-29: qty 2

## 2018-09-29 MED ORDER — AMOXICILLIN-POT CLAVULANATE 875-125 MG PO TABS: 1.0000 | ORAL_TABLET | Freq: Two times a day (BID) | ORAL | Status: DC

## 2018-09-29 MED ORDER — AMOXICILLIN-POT CLAVULANATE 875-125 MG PO TABS
1.0000 | ORAL_TABLET | Freq: Two times a day (BID) | ORAL | Status: AC
  Administered 2018-09-29 – 2018-09-30 (×2): 1 via ORAL
  Filled 2018-09-29 (×3): qty 1

## 2018-09-29 NOTE — Progress Notes (Signed)
Dr. Hilbert Bible notified of patient c/o of pain. Pt screaming medications is not working and would like something else.

## 2018-09-29 NOTE — Progress Notes (Signed)
Dr. Hilbert Bible notified by this nurse patient c/o of pain. Would like something else because pain medications is not working.

## 2018-09-29 NOTE — Progress Notes (Signed)
Patient ID: Mark [name], male   DOB: 07/03/1955, 63 y.o.   MRN: [id number]  PROGRESS NOTE    Mark [name]  RSW:[id number] DOB: [birth year] DOA: 09/19/2018 PCP: [name], MD   Brief Narrative:  63 year old male with history of alcoholic liver cirrhosis, pancytopenia, hypertension, coronary artery disease, polysubstance abuse (alcohol, heroin, marijuana), CKD stage III and MSSA bacteremia in 04/2018 presented with worsening lower back pain.  COVID-19 rapid testing was negative on admission.  MRI of the lumbar spine revealed enhancement throughout the epidural space of L3 through the sacrum concerning for possible phlegmon/abscess, L5-S1 marrow enhancement concerning for septic arthritis, and spinal stenosis L3-S1. He was started on broad-spectrum antibiotics.  Neurosurgery was consulted.  IR was also consulted.  IR thought that there is no abscess/collection large enough to aspirate or drain.  Neurosurgery subsequently recommended medical management with antibiotics for now as patient is very high risk for surgical intervention.  He was found to have MRSA bacteremia and ID was consulted.  Antibiotics was switched to daptomycin.  Hospitalization has been complicated with waxing and waning mental status.  Palliative care was consulted and CODE STATUS has been changed to DNR.  He was also started on Unasyn for aspiration pneumonia.  ID recommends intravenous daptomycin for 2 weeks and 4 weeks of doxycycline.  ID has signed off.  Assessment & Plan:   Principal Problem:   MRSA bacteremia Active Problems:   Cirrhosis, alcoholic ([place])   Polysubstance abuse ([place])   Hyponatremia   Pancytopenia ([name])   Hepatic encephalopathy ([name])   Alcohol abuse   Septic arthritis ([name])   Anemia of chronic disease   Hypoalbuminemia   Hypoglycemia without diagnosis of diabetes mellitus   Epidural abscess   Abscess in epidural space of lumbar spine   Aspiration pneumonia ([name])  Septic  arthritis/epidural abscess of lumbar spine -MRI of the lumbar spine revealed enhancement throughout the epidural space of L3 through the sacrum concerning for possible phlegmon/abscess, L5-S1 marrow enhancement concerning for septic arthritis, and spinal stenosis L3-S1 -Patient was initially started on broad-spectrum antibiotics vancomycin and Zosyn.  Currently on daptomycin as per ID. -Neurosurgery had initially recommended IR consultation for probable drainage/biopsy -As per IR, the abscess/collection was not large enough to aspirate or drain.  IR has notified Dr. Zada Finders. -Neurosurgery found patient to be high risk for any surgical intervention and is recommended conservative management with IV antibiotics only. -No evidence of loss of bowel or bladder control for now -Continue pain management and supportive care. -Patient remains afebrile. -Given issues with IV access, patient received 2 doses of Zyvox on 09/26/2018. -PICC line was placed on 09/27/2018 by IR.  Now back on daptomycin.  ID recommends 2 weeks of daptomycin followed by 4 weeks of doxycycline.  MRSA bacteremia -Repeat blood cultures have been negative since 09/21/2018 --2D echo: Preserved ejection fraction, no wall motion normalities, no vegetations appreciated on any valve.  Mild regurgitation and he tricuspid valve was appreciated by color flow Doppler -ID has signed off.  Antibiotic plan as above.  Aspiration pneumonia -Treated with Unasyn and has been switched to oral Augmentin.  Finish after 5 to 7-day of antibiotic therapy -Diet as per SLP recommendations  Acute kidney injury with metabolic acidosis  -Baseline creatinine of 0.95 on 08/22/2018.  Patient on Lasix and spironolactone at home which have remained on hold -Presented with creatinine of 1.72 which worsened up to 2.4 -Much improved.  Creatinine 0.91 today. -Renal ultrasound showed no obstructive uropathy or hydronephrosis -  Strict input and output.  Daily weights.   Monitor creatinine  Alcoholic cirrhosis with hepatic encephalopathy  Chronic thrombocytopenia -continue lactulose. -More awake this morning.  Wants to go home.  I told him that he will not be able to be discharged with a PICC line and if he wants to go home, he will need to sign out [place]. -Diuretics on hold.  Continue nadolol. -Patient has had waxing and waning mental status during the hospitalization along with aspiration pneumonia as well.  We need to be careful with use of intravenous morphine and sedatives.  Polysubstance abuse -Patient admits to alcohol and recent heroin use prior to arrival -Continue multivitamin, thiamine and folate.  Hypoalbuminemia -Follow nutrition recommendations  Generalized deconditioning -Overall prognosis is very poor.  Patient is noncompliant and still actively using drugs. -His mental status has been waxing and waning as well during the hospitalization.  Palliative care has been intermittently following the patient.  Patient has been made DNR as per palliative care discussions with family: Looking to maintain quality and avoid heroic invasive measures. -If condition worsens, consider hospice/comfort measures.  Dysphagia -Diet as per SLP recommendations.   DVT prophylaxis: SCDs Code Status: DNR Family Communication: None at bedside Disposition Plan: Depends on clinical outcome.  Will need to stay inpatient to finish 14 days of IV daptomycin.  Consultants: Neurosurgery/ID/IR/palliative care  Procedures:  2D echo: IMPRESSIONS 1. The left ventricle has normal systolic function with an ejection fraction of 60-65%. The cavity size was normal. There is mildly increased left ventricular wall thickness. Left ventricular diastolic parameters were normal. 2. The right ventricle has normal systolic function. The cavity was normal. There is no increase in right ventricular wall thickness. 3. Left atrial size was severely dilated. 4. No  evidence of mitral valve stenosis. 5. Mild thickening of the aortic valve. Mild calcification of the aortic valve. No stenosis of the aortic valve  Antimicrobials:  Anti-infectives (From admission, onward)   Start     Dose/Rate Route Frequency Ordered Stop   09/27/18 2000  Ampicillin-Sulbactam (UNASYN) 3 g in sodium chloride 0.9 % 100 mL IVPB  Status:  Discontinued     3 g 200 mL/hr over 30 Minutes Intravenous Every 8 hours 09/26/18 2013 09/27/18 1237   09/27/18 2000  DAPTOmycin (CUBICIN) 500 mg in sodium chloride 0.9 % IVPB     500 mg 220 mL/hr over 30 Minutes Intravenous Daily 09/27/18 1617     09/27/18 1245  amoxicillin-clavulanate (AUGMENTIN) 875-125 MG per tablet 1 tablet     1 tablet Oral Every 12 hours 09/27/18 1237 10/04/18 0959   09/27/18 1245  doxycycline (VIBRA-TABS) tablet 100 mg  Status:  Discontinued     100 mg Oral Every 12 hours 09/27/18 1237 09/27/18 1605   09/26/18 2200  amoxicillin-clavulanate (AUGMENTIN) 875-125 MG per tablet 1 tablet     1 tablet Oral Every 12 hours 09/26/18 2013 09/27/18 0939   09/26/18 2200  linezolid (ZYVOX) tablet 600 mg     600 mg Oral Every 12 hours 09/26/18 2013 09/27/18 0939   09/24/18 2000  DAPTOmycin (CUBICIN) 500 mg in sodium chloride 0.9 % IVPB  Status:  Discontinued     500 mg 220 mL/hr over 30 Minutes Intravenous Daily 09/23/18 2233 09/27/18 1455   09/24/18 1330  Ampicillin-Sulbactam (UNASYN) 3 g in sodium chloride 0.9 % 100 mL IVPB  Status:  Discontinued     3 g 200 mL/hr over 30 Minutes Intravenous Every 8 hours 09/24/18 1225 09/26/18  2013   09/24/18 0600  piperacillin-tazobactam (ZOSYN) IVPB 3.375 g  Status:  Discontinued     3.375 g 12.5 mL/hr over 240 Minutes Intravenous Every 8 hours 09/23/18 2233 09/24/18 1135   09/23/18 2300  piperacillin-tazobactam (ZOSYN) IVPB 3.375 g     3.375 g 100 mL/hr over 30 Minutes Intravenous  Once 09/23/18 2233 09/24/18 0318   09/21/18 1000  vancomycin (VANCOCIN) IVPB 750 mg/150 ml premix  Status:   Discontinued     750 mg 150 mL/hr over 60 Minutes Intravenous Every 24 hours 09/20/18 0949 09/21/18 0233   09/21/18 1000  DAPTOmycin (CUBICIN) 500 mg in sodium chloride 0.9 % IVPB  Status:  Discontinued     500 mg 220 mL/hr over 30 Minutes Intravenous Every 48 hours 09/21/18 0903 09/23/18 2233   09/21/18 0400  ceFAZolin (ANCEF) IVPB 2g/100 mL premix  Status:  Discontinued     2 g 200 mL/hr over 30 Minutes Intravenous Every 8 hours 09/21/18 0233 09/21/18 0900   09/20/18 1800  piperacillin-tazobactam (ZOSYN) IVPB 3.375 g  Status:  Discontinued     3.375 g 12.5 mL/hr over 240 Minutes Intravenous Every 8 hours 09/20/18 0929 09/20/18 0935   09/20/18 1500  piperacillin-tazobactam (ZOSYN) IVPB 3.375 g  Status:  Discontinued     3.375 g 12.5 mL/hr over 240 Minutes Intravenous Every 8 hours 09/20/18 0935 09/21/18 0233   09/20/18 1000  piperacillin-tazobactam (ZOSYN) IVPB 3.375 g  Status:  Discontinued     3.375 g 100 mL/hr over 30 Minutes Intravenous  Once 09/20/18 0928 09/20/18 0934   09/20/18 1000  vancomycin (VANCOCIN) 1,250 mg in sodium chloride 0.9 % 250 mL IVPB  Status:  Discontinued     1,250 mg 166.7 mL/hr over 90 Minutes Intravenous  Once 09/20/18 0928 09/20/18 0934   09/20/18 1000  vancomycin (VANCOCIN) 500 mg in sodium chloride 0.9 % 100 mL IVPB     500 mg 100 mL/hr over 60 Minutes Intravenous  Once 09/20/18 0937 09/20/18 1407   09/20/18 0715  vancomycin (VANCOCIN) IVPB 1000 mg/200 mL premix     1,000 mg 200 mL/hr over 60 Minutes Intravenous  Once 09/20/18 0706 09/20/18 0906   09/20/18 0715  piperacillin-tazobactam (ZOSYN) IVPB 3.375 g     3.375 g 100 mL/hr over 30 Minutes Intravenous  Once 09/20/18 0706 09/20/18 1008       Subjective: Patient seen and examined at bedside.  He is awake and keeps repeating that he wants to go home.  Poor historian.  No overnight fever or vomiting reported by nursing staff.  Objective: Vitals:   09/28/18 1240 09/29/18 0100 09/29/18 0515  09/29/18 1208  BP: (!) 155/66 (!) 131/54 (!) 156/69 140/64  Pulse: (!) 57 70 69 63  Resp: '16 15 16 15  ' Temp: 97.6 F (36.4 C) 98.6 F (37 C) 98.7 F (37.1 C) 98.3 F (36.8 C)  TempSrc: Oral Oral Oral Oral  SpO2: 100% 97% 97% 99%  Weight:      Height:        Intake/Output Summary (Last 24 hours) at 09/29/2018 1442 Last data filed at 09/29/2018 1030 Gross per 24 hour  Intake 250 ml  Output 2275 ml  Net -2025 ml   Filed Weights   09/19/18 2011 09/20/18 1055  Weight: 61.2 kg 64.7 kg    Examination:  General exam: Appears calm and comfortable.  Keeps repeating that he wants to go home.  Intermittently gets angry.  Looks older than stated age. Respiratory system: Bilateral decreased  breath sounds at bases with some scattered crackles Cardiovascular system: S1 & S2 heard, Rate controlled Gastrointestinal system: Abdomen is distended, soft and nontender. Normal bowel sounds heard. Extremities: No cyanosis, clubbing; trace edema   Data Reviewed: I have personally reviewed following labs and imaging studies  CBC: Recent Labs  Lab 09/24/18 0213 09/25/18 0512  WBC 13.8* 11.8*  HGB 8.2* 7.6*  HCT 25.3* 22.9*  MCV 97.7 94.6  PLT 64* 61*   Basic Metabolic Panel: Recent Labs  Lab 09/23/18 1757 09/24/18 0213 09/25/18 0512 09/29/18 0442  NA 138 142 144  --   K 3.8 3.6 3.4*  --   CL 115* 115* 117*  --   CO2 16* 18* 18*  --   GLUCOSE 90 113* 130*  --   BUN 36* 36* 27*  --   CREATININE 1.52* 1.41* 1.17 0.91  CALCIUM 7.6* 7.7* 7.6*  --    GFR: Estimated Creatinine Clearance: 70.4 mL/min (by C-G formula based on SCr of 0.91 mg/dL). Liver Function Tests: Recent Labs  Lab 09/23/18 1757 09/24/18 0213  AST 27 31  ALT 10 11  ALKPHOS 134* 152*  BILITOT 1.3* 1.1  PROT 5.3* 5.6*  ALBUMIN 1.4* 1.4*   No results for input(s): LIPASE, AMYLASE in the last 168 hours. Recent Labs  Lab 09/23/18 1757 09/24/18 0214  AMMONIA 11 17   Coagulation Profile: No results for  input(s): INR, PROTIME in the last 168 hours. Cardiac Enzymes: Recent Labs  Lab 09/29/18 0442  CKTOTAL 493*   BNP (last 3 results) No results for input(s): PROBNP in the last 8760 hours. HbA1C: No results for input(s): HGBA1C in the last 72 hours. CBG: Recent Labs  Lab 09/28/18 2052 09/29/18 0057 09/29/18 0419 09/29/18 0752 09/29/18 1207  GLUCAP 105* 143* 121* 107* 126*   Lipid Profile: No results for input(s): CHOL, HDL, LDLCALC, TRIG, CHOLHDL, LDLDIRECT in the last 72 hours. Thyroid Function Tests: No results for input(s): TSH, T4TOTAL, FREET4, T3FREE, THYROIDAB in the last 72 hours. Anemia Panel: No results for input(s): VITAMINB12, FOLATE, FERRITIN, TIBC, IRON, RETICCTPCT in the last 72 hours. Sepsis Labs: No results for input(s): PROCALCITON, LATICACIDVEN in the last 168 hours.  Recent Results (from the past 240 hour(s))  Culture, blood (routine x 2)     Status: Abnormal   Collection Time: 09/20/18  7:25 AM  Result Value Ref Range Status   Specimen Description BLOOD RIGHT ANTECUBITAL  Final   Special Requests   Final    BOTTLES DRAWN AEROBIC AND ANAEROBIC Blood Culture results may not be optimal due to an excessive volume of blood received in culture bottles   Culture  Setup Time   Final    IN BOTH AEROBIC AND ANAEROBIC BOTTLES GRAM POSITIVE COCCI CRITICAL RESULT CALLED TO, READ BACK BY AND VERIFIED WITH: Salli Real [id number] 09/21/2018 Mena Goes Performed at [facility], [place] [address] [place], [place] [address]    Culture METHICILLIN RESISTANT STAPHYLOCOCCUS AUREUS (A)  Final   Report Status 09/22/2018 FINAL  Final   Organism ID, Bacteria METHICILLIN RESISTANT STAPHYLOCOCCUS AUREUS  Final      Susceptibility   Methicillin resistant staphylococcus aureus - MIC*    CIPROFLOXACIN <=0.5 SENSITIVE Sensitive     ERYTHROMYCIN >=8 RESISTANT Resistant     GENTAMICIN <=0.5 SENSITIVE Sensitive     OXACILLIN RESISTANT Resistant     TETRACYCLINE <=1 SENSITIVE  Sensitive     VANCOMYCIN 1 SENSITIVE Sensitive     TRIMETH/SULFA <=10 SENSITIVE Sensitive  CLINDAMYCIN <=0.25 SENSITIVE Sensitive     RIFAMPIN <=0.5 SENSITIVE Sensitive     Inducible Clindamycin NEGATIVE Sensitive     * METHICILLIN RESISTANT STAPHYLOCOCCUS AUREUS  Blood Culture ID Panel (Reflexed)     Status: Abnormal   Collection Time: 09/20/18  7:25 AM  Result Value Ref Range Status   Enterococcus species NOT DETECTED NOT DETECTED Final   Listeria monocytogenes NOT DETECTED NOT DETECTED Final   Staphylococcus species DETECTED (A) NOT DETECTED Final    Comment: CRITICAL RESULT CALLED TO, READ BACK BY AND VERIFIED WITH: Nadean Corwin [id number] 09/21/2018 T. TYSOR    Staphylococcus aureus (BCID) DETECTED (A) NOT DETECTED Final    Comment: Methicillin (oxacillin)-resistant Staphylococcus aureus (MRSA). MRSA is predictably resistant to beta-lactam antibiotics (except ceftaroline). Preferred therapy is vancomycin unless clinically contraindicated. Patient requires contact precautions if  hospitalized. CRITICAL RESULT CALLED TO, READ BACK BY AND VERIFIED WITH: G. ABBOTT,PHARMD 0226 09/21/2018 T. TYSOR    Methicillin resistance DETECTED (A) NOT DETECTED Final    Comment: CRITICAL RESULT CALLED TO, READ BACK BY AND VERIFIED WITH: G. ABBOTT,PHARMD 0226 09/21/2018 T. TYSOR    Streptococcus species NOT DETECTED NOT DETECTED Final   Streptococcus agalactiae NOT DETECTED NOT DETECTED Final   Streptococcus pneumoniae NOT DETECTED NOT DETECTED Final   Streptococcus pyogenes NOT DETECTED NOT DETECTED Final   Acinetobacter baumannii NOT DETECTED NOT DETECTED Final   Enterobacteriaceae species NOT DETECTED NOT DETECTED Final   Enterobacter cloacae complex NOT DETECTED NOT DETECTED Final   Escherichia coli NOT DETECTED NOT DETECTED Final   Klebsiella oxytoca NOT DETECTED NOT DETECTED Final   Klebsiella pneumoniae NOT DETECTED NOT DETECTED Final   Proteus species NOT DETECTED NOT DETECTED Final    Serratia marcescens NOT DETECTED NOT DETECTED Final   Haemophilus influenzae NOT DETECTED NOT DETECTED Final   Neisseria meningitidis NOT DETECTED NOT DETECTED Final   Pseudomonas aeruginosa NOT DETECTED NOT DETECTED Final   Candida albicans NOT DETECTED NOT DETECTED Final   Candida glabrata NOT DETECTED NOT DETECTED Final   Candida krusei NOT DETECTED NOT DETECTED Final   Candida parapsilosis NOT DETECTED NOT DETECTED Final   Candida tropicalis NOT DETECTED NOT DETECTED Final    Comment: Performed at [facility], [place]. [address] [place], [place] [address]  Culture, blood (routine x 2)     Status: Abnormal   Collection Time: 09/20/18  7:34 AM  Result Value Ref Range Status   Specimen Description BLOOD LEFT ANTECUBITAL  Final   Special Requests   Final    BOTTLES DRAWN AEROBIC AND ANAEROBIC Blood Culture adequate volume   Culture  Setup Time   Final    IN BOTH AEROBIC AND ANAEROBIC BOTTLES GRAM POSITIVE COCCI CRITICAL RESULT CALLED TO, READ BACK BY AND VERIFIED WITH: G. ABBOTT,PHARMD 0226 09/21/2018 T. TYSOR    Culture (A)  Final    STAPHYLOCOCCUS AUREUS SUSCEPTIBILITIES PERFORMED ON PREVIOUS CULTURE WITHIN THE LAST 5 DAYS. Performed at [facility], [place] [address], [place], [place] [address]    Report Status 09/22/2018 FINAL  Final  SARS Coronavirus 2 (CEPHEID - Performed in [place] hospital lab), Hosp Order     Status: None   Collection Time: 09/20/18  7:34 AM  Result Value Ref Range Status   SARS Coronavirus 2 NEGATIVE NEGATIVE Final    Comment: (NOTE) If result is NEGATIVE SARS-CoV-2 target nucleic acids are NOT DETECTED. The SARS-CoV-2 RNA is generally detectable in upper and lower  respiratory specimens during the acute  phase of infection. The lowest  concentration of SARS-CoV-2 viral copies this assay can detect is 250  copies / mL. A negative result does not preclude SARS-CoV-2 infection  and should not be used as the sole basis for treatment or other   patient management decisions.  A negative result may occur with  improper specimen collection / handling, submission of specimen other  than nasopharyngeal swab, presence of viral mutation(s) within the  areas targeted by this assay, and inadequate number of viral copies  (<250 copies / mL). A negative result must be combined with clinical  observations, patient history, and epidemiological information. If result is POSITIVE SARS-CoV-2 target nucleic acids are DETECTED. The SARS-CoV-2 RNA is generally detectable in upper and lower  respiratory specimens dur ing the acute phase of infection.  Positive  results are indicative of active infection with SARS-CoV-2.  Clinical  correlation with patient history and other diagnostic information is  necessary to determine patient infection status.  Positive results do  not rule out bacterial infection or co-infection with other viruses. If result is PRESUMPTIVE POSTIVE SARS-CoV-2 nucleic acids MAY BE PRESENT.   A presumptive positive result was obtained on the submitted specimen  and confirmed on repeat testing.  While 2019 novel coronavirus  (SARS-CoV-2) nucleic acids may be present in the submitted sample  additional confirmatory testing may be necessary for epidemiological  and / or clinical management purposes  to differentiate between  SARS-CoV-2 and other Sarbecovirus currently known to infect humans.  If clinically indicated additional testing with an alternate test  methodology [id number]) is advised. The SARS-CoV-2 RNA is generally  detectable in upper and lower respiratory sp ecimens during the acute  phase of infection. The expected result is Negative. Fact Sheet for Patients:  StrictlyIdeas.no Fact Sheet for Healthcare Providers: BankingDealers.co.za This test is not yet approved or cleared by the [place] FDA and has been authorized for detection and/or diagnosis of SARS-CoV-2 by  FDA under an Emergency Use Authorization (EUA).  This EUA will remain in effect (meaning this test can be used) for the duration of the COVID-19 declaration under Section 564(b)(1) of the Act, 21 U.S.C. section 360bbb-3(b)(1), unless the authorization is terminated or revoked sooner. Performed at [facility], [place] [address], [place], [place] [address]   Culture, blood (routine x 2)     Status: None   Collection Time: 09/21/18 12:23 PM  Result Value Ref Range Status   Specimen Description BLOOD LEFT ANTECUBITAL  Final   Special Requests   Final    BOTTLES DRAWN AEROBIC ONLY Blood Culture results may not be optimal due to an inadequate volume of blood received in culture bottles   Culture   Final    NO GROWTH 5 DAYS Performed at [facility], [place] [address] [place], [place] [address]    Report Status 09/26/2018 FINAL  Final  Culture, blood (routine x 2)     Status: None   Collection Time: 09/21/18  3:45 PM  Result Value Ref Range Status   Specimen Description BLOOD RIGHT HAND  Final   Special Requests   Final    BOTTLES DRAWN AEROBIC ONLY Blood Culture results may not be optimal due to an inadequate volume of blood received in culture bottles   Culture   Final    NO GROWTH 5 DAYS Performed at [facility], [place] [address], [place], [place] [address]    Report Status 09/26/2018 FINAL  Final  Radiology Studies: Ir Picc Placement Right >5 Yrs Inc Img Guide  Result Date: 09/27/2018 INDICATION: Bacteremia, access for antibiotics EXAM: ULTRASOUND AND FLUOROSCOPIC GUIDED PICC LINE INSERTION MEDICATIONS: 1% lidocaine local CONTRAST:  None FLUOROSCOPY TIME:  5.0 minutes (27 mGy) COMPLICATIONS: None immediate. TECHNIQUE: The procedure, risks, benefits, and alternatives were explained to the patient's family and informed written consent was obtained. A timeout was performed prior to the initiation of the procedure. The right upper extremity was prepped with  chlorhexidine in a sterile fashion, and a sterile drape was applied covering the operative field. Maximum barrier sterile technique with sterile gowns and gloves were used for the procedure. A timeout was performed prior to the initiation of the procedure. Local anesthesia was provided with 1% lidocaine. Under direct ultrasound guidance, the right brachial vein was accessed with a micropuncture kit after the overlying soft tissues were anesthetized with 1% lidocaine. An ultrasound image was saved for documentation purposes. A guidewire was eventually advanced to the level of the superior caval-atrial junction for measurement purposes and the PICC line was cut to length. A peel-away sheath was placed and a 28 cm, 5 [place], dual lumen was inserted to level of the superior caval-atrial junction. A post procedure spot fluoroscopic was obtained. The catheter easily aspirated and flushed and was sutured in place. A dressing was placed. The patient tolerated the procedure well without immediate post procedural complication. FINDINGS: After catheter placement, the tip lies within the superior cavoatrial junction. The catheter aspirates and flushes normally and is ready for immediate use. IMPRESSION: Successful ultrasound and fluoroscopic guided placement of a right brachial vein approach, 28 cm, 5 [place], dual lumen PICC with tip at the superior caval-atrial junction. The PICC line is ready for immediate use. Electronically Signed   By: Jerilynn Mages.  Shick M.D.   On: 09/27/2018 15:53        Scheduled Meds: . amoxicillin-clavulanate  1 tablet Oral Q12H  . cloNIDine  0.2 mg Transdermal Weekly  . diazepam  2.5 mg Oral BID  . feeding supplement  1 Container Oral BID BM  . feeding supplement (ENSURE ENLIVE)  237 mL Oral BID PC  . feeding supplement (PRO-STAT SUGAR FREE 64)  30 mL Oral TID WC  . folic acid  1 mg Oral Daily  . insulin aspart  0-9 Units Subcutaneous TID WC  . lactulose  30 g Oral TID  .  morphine injection   1 mg Intravenous Q6H  . multivitamin with minerals  1 tablet Oral Daily  . nadolol  20 mg Oral Daily  . saccharomyces boulardii  250 mg Oral BID  . sodium chloride flush  10-40 mL Intracatheter Q12H  . thiamine  100 mg Oral Daily   Continuous Infusions: . DAPTOmycin (CUBICIN)  IV Stopped (09/28/18 2352)  . dextrose 5 % and 0.9% NaCl 75 mL/hr at 09/28/18 0941     LOS: 9 days        Aline August, MD [facility] 09/29/2018, 2:42 PM

## 2018-09-29 NOTE — Progress Notes (Signed)
  Speech Language Pathology Treatment: Dysphagia  Patient Details Name: Mark [name] MRN: [id number] DOB: 1956/01/02 Today's Date: 09/29/2018 Time: 6578-4696 SLP Time Calculation (min) (ACUTE ONLY): 14 min  Assessment / Plan / Recommendation Clinical Impression  Pt tolerating thin liquids without incident, observed taking multiple large pills with thin liquids with no struggle. Pt also able to masticate grapes with somewhat unsafe self feeding methods with extra time but adequate mastication. He is alert and participatory, making logical arguments but also still moderately inappropriate. He insists upon seeing his "surgeon" though I believe he means the Radiologist who placed his PICC. Discussed with RN. Pts diet is appropriate, no SLP f/u needed will sign off.    HPI HPI: 63 yo male adm to [facility] with h/o polysubstance abuse and ETOH/cirrohisis with lumbar spine abscess and MRSA bacteremia.  Neuro surgery and ID following.  Pt with possible/probable aspiration episode and SLP swallow eval was ordered.  CXR showed lower lobe infiltrates concerning for aspiration.  Pt also had episode of vomiting bile type material.        SLP Plan  All goals met       Recommendations  Diet recommendations: Dysphagia 3 (mechanical soft);Thin liquid Supervision: Patient able to self feed Compensations: Slow rate;Small sips/bites                Plan: All goals met       GO               Mark [place], MA CCC-SLP  Acute Rehabilitation Services Pager [phone] Office [phone]  Lynann Beaver 09/29/2018, 10:35 AM

## 2018-09-30 LAB — MAGNESIUM: Magnesium: 1.6 mg/dL — ABNORMAL LOW (ref 1.7–2.4)

## 2018-09-30 LAB — CBC WITH DIFFERENTIAL/PLATELET
Abs Immature Granulocytes: 0.11 10*3/uL — ABNORMAL HIGH (ref 0.00–0.07)
Basophils Absolute: 0 10*3/uL (ref 0.0–0.1)
Basophils Relative: 0 %
Eosinophils Absolute: 0.2 10*3/uL (ref 0.0–0.5)
Eosinophils Relative: 1 %
HCT: 22.3 % — ABNORMAL LOW (ref 39.0–52.0)
Hemoglobin: 7 g/dL — ABNORMAL LOW (ref 13.0–17.0)
Immature Granulocytes: 1 %
Lymphocytes Relative: 6 %
Lymphs Abs: 0.8 10*3/uL (ref 0.7–4.0)
MCH: 31.7 pg (ref 26.0–34.0)
MCHC: 31.4 g/dL (ref 30.0–36.0)
MCV: 100.9 fL — ABNORMAL HIGH (ref 80.0–100.0)
Monocytes Absolute: 1.1 10*3/uL — ABNORMAL HIGH (ref 0.1–1.0)
Monocytes Relative: 7 %
Neutro Abs: 12.3 10*3/uL — ABNORMAL HIGH (ref 1.7–7.7)
Neutrophils Relative %: 85 %
Platelets: 126 10*3/uL — ABNORMAL LOW (ref 150–400)
RBC: 2.21 MIL/uL — ABNORMAL LOW (ref 4.22–5.81)
RDW: 19.1 % — ABNORMAL HIGH (ref 11.5–15.5)
WBC: 14.5 10*3/uL — ABNORMAL HIGH (ref 4.0–10.5)
nRBC: 0 % (ref 0.0–0.2)

## 2018-09-30 LAB — COMPREHENSIVE METABOLIC PANEL
ALT: 38 U/L (ref 0–44)
AST: 104 U/L — ABNORMAL HIGH (ref 15–41)
Albumin: 1.3 g/dL — ABNORMAL LOW (ref 3.5–5.0)
Alkaline Phosphatase: 117 U/L (ref 38–126)
Anion gap: 6 (ref 5–15)
BUN: 8 mg/dL (ref 8–23)
CO2: 18 mmol/L — ABNORMAL LOW (ref 22–32)
Calcium: 7.4 mg/dL — ABNORMAL LOW (ref 8.9–10.3)
Chloride: 114 mmol/L — ABNORMAL HIGH (ref 98–111)
Creatinine, Ser: 0.77 mg/dL (ref 0.61–1.24)
GFR calc Af Amer: >60 mL/min (ref >60)
GFR calc non Af Amer: >60 mL/min (ref >60)
Glucose, Bld: 102 mg/dL — ABNORMAL HIGH (ref 70–99)
Potassium: 2.7 mmol/L — CL (ref 3.5–5.1)
Sodium: 138 mmol/L (ref 135–145)
Total Bilirubin: 0.8 mg/dL (ref 0.3–1.2)
Total Protein: 4.9 g/dL — ABNORMAL LOW (ref 6.5–8.1)

## 2018-09-30 LAB — GLUCOSE, CAPILLARY
Glucose-Capillary: 104 mg/dL — ABNORMAL HIGH (ref 70–99)
Glucose-Capillary: 102 mg/dL — ABNORMAL HIGH (ref 70–99)
Glucose-Capillary: 100 mg/dL — ABNORMAL HIGH (ref 70–99)
Glucose-Capillary: 129 mg/dL — ABNORMAL HIGH (ref 70–99)

## 2018-09-30 LAB — AMMONIA: Ammonia: 37 umol/L — ABNORMAL HIGH (ref 9–35)

## 2018-09-30 MED ORDER — MAGNESIUM SULFATE 2 GM/50ML IV SOLN
2.0000 g | Freq: Once | INTRAVENOUS | Status: AC
  Administered 2018-09-30: 2 g via INTRAVENOUS
  Filled 2018-09-30: qty 50

## 2018-09-30 MED ORDER — POTASSIUM CHLORIDE 10 MEQ/100ML IV SOLN
10.0000 meq | INTRAVENOUS | Status: AC
  Administered 2018-09-30 (×5): 10 meq via INTRAVENOUS
  Filled 2018-09-30 (×5): qty 100

## 2018-09-30 MED ORDER — POTASSIUM CHLORIDE CRYS ER 20 MEQ PO TBCR
40.0000 meq | EXTENDED_RELEASE_TABLET | Freq: Once | ORAL | Status: AC
  Administered 2018-09-30: 40 meq via ORAL
  Filled 2018-09-30: qty 2

## 2018-09-30 MED ORDER — PROMETHAZINE HCL 25 MG/ML IJ SOLN
12.5000 mg | Freq: Once | INTRAMUSCULAR | Status: AC
  Administered 2018-10-01: 12.5 mg via INTRAVENOUS
  Filled 2018-09-30: qty 1

## 2018-09-30 MED ORDER — DOXYCYCLINE HYCLATE 100 MG PO TABS: 100.0000 mg | ORAL_TABLET | Freq: Two times a day (BID) | ORAL | Status: DC

## 2018-09-30 MED ORDER — OXYCODONE HCL 5 MG PO TABS
5.0000 mg | ORAL_TABLET | ORAL | Status: DC | PRN
  Administered 2018-09-30: 5 mg via ORAL
  Administered 2018-09-30 – 2018-10-01 (×2): 10 mg via ORAL
  Administered 2018-10-01: 5 mg via ORAL
  Administered 2018-10-01 – 2018-10-04 (×9): 10 mg via ORAL
  Filled 2018-09-30: qty 2
  Filled 2018-09-30: qty 1
  Filled 2018-09-30 (×10): qty 2
  Filled 2018-09-30: qty 1
  Filled 2018-09-30: qty 2

## 2018-09-30 MED ORDER — FUROSEMIDE 20 MG PO TABS
20.0000 mg | ORAL_TABLET | Freq: Two times a day (BID) | ORAL | Status: DC
  Administered 2018-09-30 – 2018-10-04 (×10): 20 mg via ORAL
  Filled 2018-09-30 (×10): qty 1

## 2018-09-30 NOTE — Progress Notes (Signed)
Patient ID: Mark [name], male   DOB: [birth year], 63 y.o.   MRN: [id number]  PROGRESS NOTE    Rico Massar  NWG:[id number] DOB: [birth year] DOA: 09/19/2018 PCP: [name], MD   Brief Narrative:  63 year old male with history of alcoholic liver cirrhosis, pancytopenia, hypertension, coronary artery disease, polysubstance abuse (alcohol, heroin, marijuana), CKD stage III and MSSA bacteremia in 04/2018 presented with worsening lower back pain.  COVID-19 rapid testing was negative on admission.  MRI of the lumbar spine revealed enhancement throughout the epidural space of L3 through the sacrum concerning for possible phlegmon/abscess, L5-S1 marrow enhancement concerning for septic arthritis, and spinal stenosis L3-S1. He was started on broad-spectrum antibiotics.  Neurosurgery was consulted.  IR was also consulted.  IR thought that there is no abscess/collection large enough to aspirate or drain.  Neurosurgery subsequently recommended medical management with antibiotics for now as patient is very high risk for surgical intervention.  He was found to have MRSA bacteremia and ID was consulted.  Antibiotics was switched to daptomycin.  Hospitalization has been complicated with waxing and waning mental status.  Palliative care was consulted and CODE STATUS has been changed to DNR.  He was also started on Unasyn for aspiration pneumonia.  ID recommends intravenous daptomycin for 2 weeks and 4 weeks of doxycycline.  ID has signed off.  Assessment & Plan:   Principal Problem:   MRSA bacteremia Active Problems:   Cirrhosis, alcoholic ([place])   Polysubstance abuse ([place])   Hyponatremia   Pancytopenia ([name])   Hepatic encephalopathy ([name])   Alcohol abuse   Septic arthritis ([name])   Anemia of chronic disease   Hypoalbuminemia   Hypoglycemia without diagnosis of diabetes mellitus   Epidural abscess   Abscess in epidural space of lumbar spine   Aspiration pneumonia ([name])  Septic  arthritis/epidural abscess of lumbar spine -MRI of the lumbar spine revealed enhancement throughout the epidural space of L3 through the sacrum concerning for possible phlegmon/abscess, L5-S1 marrow enhancement concerning for septic arthritis, and spinal stenosis L3-S1 -Patient was initially started on broad-spectrum antibiotics vancomycin and Zosyn.  Currently on daptomycin as per ID. -Neurosurgery had initially recommended IR consultation for probable drainage/biopsy -As per IR, the abscess/collection was not large enough to aspirate or drain.  IR has notified Dr. Zada Finders. -Neurosurgery found patient to be high risk for any surgical intervention and is recommended conservative management with IV antibiotics only. -No evidence of loss of bowel or bladder control for now -Continue pain management and supportive care.  Patient complaining of increasing pain.  Increase oxycodone dose.  Might need long-acting opiate.  Monitor mental status. -Patient remains afebrile. -Given issues with IV access, patient received 2 doses of Zyvox on 09/26/2018. -PICC line was placed on 09/27/2018 by IR.  Now back on daptomycin.  ID recommends 2 weeks of daptomycin, last date is 10/04/2018; followed by 4 weeks of doxycycline.  MRSA bacteremia -Repeat blood cultures have been negative since 09/21/2018 -2D echo: Preserved ejection fraction, no wall motion normalities, no vegetations appreciated on any valve.  Mild regurgitation and he tricuspid valve was appreciated by color flow Doppler -ID has signed off.  Antibiotic plan as above.  Aspiration pneumonia -Treated with Unasyn and has been switched to oral Augmentin.  Finish after 7-day of antibiotic therapy -Diet as per SLP recommendations  Leukocytosis -From above.  Worsening.  Monitor.  Acute kidney injury with metabolic acidosis  -Baseline creatinine of 0.95 on 08/22/2018.  Patient on Lasix and spironolactone at home  which have remained on hold -Presented with  creatinine of 1.72 which worsened up to 2.4 -Much improved.  Creatinine 0.77 today. -Renal ultrasound showed no obstructive uropathy or hydronephrosis -Strict input and output.  Daily weights.  Monitor creatinine  Anemia of chronic disease -Hemoglobin 7 today.  Transfuse if hemoglobin is less than 7.  No signs of active bleeding.  Hypokalemia -Replace.  Repeat a.m. labs  Hypomagnesemia--replace.  Repeat a.m. labs  Alcoholic cirrhosis with hepatic encephalopathy  Chronic thrombocytopenia -continue lactulose. -Diuretics on hold.  Continue nadolol. -Patient has had waxing and waning mental status during the hospitalization along with aspiration pneumonia as well.  We need to be careful with use of opioids and sedatives. -Might need to consider restarting diuretics as his abdomen is getting more distended.  Polysubstance abuse -Patient admits to alcohol and recent heroin use prior to arrival -Continue multivitamin, thiamine and folate.  Hypoalbuminemia -Oral intake still very poor. -Follow nutrition recommendations  Generalized deconditioning -Overall prognosis is very poor.  Patient is noncompliant and still actively using drugs. -His mental status has been waxing and waning as well during the hospitalization.  Palliative care has been intermittently following the patient.  Patient has been made DNR as per palliative care discussions with family: Looking to maintain quality and avoid heroic invasive measures. -If condition worsens, consider hospice/comfort measures.  Dysphagia -Diet as per SLP recommendations.   DVT prophylaxis: SCDs Code Status: DNR Family Communication: None at bedside Disposition Plan: Depends on clinical outcome.  Will need to stay inpatient till 10/04/2018 to finish 14 days of IV daptomycin.  Consultants: Neurosurgery/ID/IR/palliative care  Procedures:  2D echo: IMPRESSIONS 1. The left ventricle has normal systolic function with an ejection fraction of  60-65%. The cavity size was normal. There is mildly increased left ventricular wall thickness. Left ventricular diastolic parameters were normal. 2. The right ventricle has normal systolic function. The cavity was normal. There is no increase in right ventricular wall thickness. 3. Left atrial size was severely dilated. 4. No evidence of mitral valve stenosis. 5. Mild thickening of the aortic valve. Mild calcification of the aortic valve. No stenosis of the aortic valve  Antimicrobials:  Anti-infectives (From admission, onward)   Start     Dose/Rate Route Frequency Ordered Stop   09/30/18 1000  amoxicillin-clavulanate (AUGMENTIN) 875-125 MG per tablet 1 tablet  Status:  Discontinued     1 tablet Oral Every 12 hours 09/29/18 1548 09/29/18 1549   09/29/18 2200  amoxicillin-clavulanate (AUGMENTIN) 875-125 MG per tablet 1 tablet     1 tablet Oral Every 12 hours 09/29/18 1549 10/01/18 0959   09/27/18 2000  Ampicillin-Sulbactam (UNASYN) 3 g in sodium chloride 0.9 % 100 mL IVPB  Status:  Discontinued     3 g 200 mL/hr over 30 Minutes Intravenous Every 8 hours 09/26/18 2013 09/27/18 1237   09/27/18 2000  DAPTOmycin (CUBICIN) 500 mg in sodium chloride 0.9 % IVPB     500 mg 220 mL/hr over 30 Minutes Intravenous Daily 09/27/18 1617 10/04/18 2359   09/27/18 1245  amoxicillin-clavulanate (AUGMENTIN) 875-125 MG per tablet 1 tablet  Status:  Discontinued     1 tablet Oral Every 12 hours 09/27/18 1237 09/29/18 1548   09/27/18 1245  doxycycline (VIBRA-TABS) tablet 100 mg  Status:  Discontinued     100 mg Oral Every 12 hours 09/27/18 1237 09/27/18 1605   09/26/18 2200  amoxicillin-clavulanate (AUGMENTIN) 875-125 MG per tablet 1 tablet     1 tablet Oral Every 12 hours  09/26/18 2013 09/27/18 0939   09/26/18 2200  linezolid (ZYVOX) tablet 600 mg     600 mg Oral Every 12 hours 09/26/18 2013 09/27/18 0939   09/24/18 2000  DAPTOmycin (CUBICIN) 500 mg in sodium chloride 0.9 % IVPB  Status:  Discontinued      500 mg 220 mL/hr over 30 Minutes Intravenous Daily 09/23/18 2233 09/27/18 1455   09/24/18 1330  Ampicillin-Sulbactam (UNASYN) 3 g in sodium chloride 0.9 % 100 mL IVPB  Status:  Discontinued     3 g 200 mL/hr over 30 Minutes Intravenous Every 8 hours 09/24/18 1225 09/26/18 2013   09/24/18 0600  piperacillin-tazobactam (ZOSYN) IVPB 3.375 g  Status:  Discontinued     3.375 g 12.5 mL/hr over 240 Minutes Intravenous Every 8 hours 09/23/18 2233 09/24/18 1135   09/23/18 2300  piperacillin-tazobactam (ZOSYN) IVPB 3.375 g     3.375 g 100 mL/hr over 30 Minutes Intravenous  Once 09/23/18 2233 09/24/18 0318   09/21/18 1000  vancomycin (VANCOCIN) IVPB 750 mg/150 ml premix  Status:  Discontinued     750 mg 150 mL/hr over 60 Minutes Intravenous Every 24 hours 09/20/18 0949 09/21/18 0233   09/21/18 1000  DAPTOmycin (CUBICIN) 500 mg in sodium chloride 0.9 % IVPB  Status:  Discontinued     500 mg 220 mL/hr over 30 Minutes Intravenous Every 48 hours 09/21/18 0903 09/23/18 2233   09/21/18 0400  ceFAZolin (ANCEF) IVPB 2g/100 mL premix  Status:  Discontinued     2 g 200 mL/hr over 30 Minutes Intravenous Every 8 hours 09/21/18 0233 09/21/18 0900   09/20/18 1800  piperacillin-tazobactam (ZOSYN) IVPB 3.375 g  Status:  Discontinued     3.375 g 12.5 mL/hr over 240 Minutes Intravenous Every 8 hours 09/20/18 0929 09/20/18 0935   09/20/18 1500  piperacillin-tazobactam (ZOSYN) IVPB 3.375 g  Status:  Discontinued     3.375 g 12.5 mL/hr over 240 Minutes Intravenous Every 8 hours 09/20/18 0935 09/21/18 0233   09/20/18 1000  piperacillin-tazobactam (ZOSYN) IVPB 3.375 g  Status:  Discontinued     3.375 g 100 mL/hr over 30 Minutes Intravenous  Once 09/20/18 0928 09/20/18 0934   09/20/18 1000  vancomycin (VANCOCIN) 1,250 mg in sodium chloride 0.9 % 250 mL IVPB  Status:  Discontinued     1,250 mg 166.7 mL/hr over 90 Minutes Intravenous  Once 09/20/18 0928 09/20/18 0934   09/20/18 1000  vancomycin (VANCOCIN) 500 mg in  sodium chloride 0.9 % 100 mL IVPB     500 mg 100 mL/hr over 60 Minutes Intravenous  Once 09/20/18 0937 09/20/18 1407   09/20/18 0715  vancomycin (VANCOCIN) IVPB 1000 mg/200 mL premix     1,000 mg 200 mL/hr over 60 Minutes Intravenous  Once 09/20/18 0706 09/20/18 0906   09/20/18 0715  piperacillin-tazobactam (ZOSYN) IVPB 3.375 g     3.375 g 100 mL/hr over 30 Minutes Intravenous  Once 09/20/18 0706 09/20/18 1008        Subjective: Patient seen and examined at bedside.  Very poor historian.  Keeps repeating that he wants to go home and that he has done PICC line at home in the past.  As per nursing report, complained of increasing pain overnight.  Denies any worsening pain currently.  No overnight fever or vomiting.   Objective: Vitals:   09/29/18 0515 09/29/18 1208 09/29/18 2138 09/30/18 0641  BP: (!) 156/69 140/64 (!) 147/57 139/63  Pulse: 69 63 (!) 59 69  Resp: 16 15 18  18  Temp: 98.7 F (37.1 C) 98.3 F (36.8 C) (!) 97.5 F (36.4 C) 97.7 F (36.5 C)  TempSrc: Oral Oral Oral Oral  SpO2: 97% 99% 99% 99%  Weight:      Height:        Intake/Output Summary (Last 24 hours) at 09/30/2018 0802 Last data filed at 09/30/2018 1324 Gross per 24 hour  Intake 600 ml  Output 2600 ml  Net -2000 ml   Filed Weights   09/19/18 2011 09/20/18 1055  Weight: 61.2 kg 64.7 kg    Examination:  General exam: Looks chronically ill.  Looks older than stated age.  Poor historian.  Awake, alert and almost oriented x3.  States that this is may of 2020. Respiratory system: Bilateral decreased breath sounds at bases with some scattered crackles.  No wheezing Cardiovascular system: S1 & S2 heard, intermittently bradycardic gastrointestinal system: Abdomen is distended, soft and nontender. Normal bowel sounds heard. Extremities: No cyanosis; trace edema   Data Reviewed: I have personally reviewed following labs and imaging studies  CBC: Recent Labs  Lab 09/24/18 0213 09/25/18 0512 09/30/18 0443    WBC 13.8* 11.8* 14.5*  NEUTROABS  --   --  12.3*  HGB 8.2* 7.6* 7.0*  HCT 25.3* 22.9* 22.3*  MCV 97.7 94.6 100.9*  PLT 64* 61* 401*   Basic Metabolic Panel: Recent Labs  Lab 09/23/18 1757 09/24/18 0213 09/25/18 0512 09/29/18 0442 09/30/18 0443  NA 138 142 144  --  138  K 3.8 3.6 3.4*  --  2.7*  CL 115* 115* 117*  --  114*  CO2 16* 18* 18*  --  18*  GLUCOSE 90 113* 130*  --  102*  BUN 36* 36* 27*  --  8  CREATININE 1.52* 1.41* 1.17 0.91 0.77  CALCIUM 7.6* 7.7* 7.6*  --  7.4*  MG  --   --   --   --  1.6*   GFR: Estimated Creatinine Clearance: 80.1 mL/min (by C-G formula based on SCr of 0.77 mg/dL). Liver Function Tests: Recent Labs  Lab 09/23/18 1757 09/24/18 0213 09/30/18 0443  AST 27 31 104*  ALT 10 11 38  ALKPHOS 134* 152* 117  BILITOT 1.3* 1.1 0.8  PROT 5.3* 5.6* 4.9*  ALBUMIN 1.4* 1.4* 1.3*   No results for input(s): LIPASE, AMYLASE in the last 168 hours. Recent Labs  Lab 09/23/18 1757 09/24/18 0214 09/30/18 0443  AMMONIA 11 17 37*   Coagulation Profile: No results for input(s): INR, PROTIME in the last 168 hours. Cardiac Enzymes: Recent Labs  Lab 09/29/18 0442  CKTOTAL 493*   BNP (last 3 results) No results for input(s): PROBNP in the last 8760 hours. HbA1C: No results for input(s): HGBA1C in the last 72 hours. CBG: Recent Labs  Lab 09/29/18 0752 09/29/18 1207 09/29/18 1556 09/29/18 2021 09/30/18 0639  GLUCAP 107* 126* 82 107* 104*   Lipid Profile: No results for input(s): CHOL, HDL, LDLCALC, TRIG, CHOLHDL, LDLDIRECT in the last 72 hours. Thyroid Function Tests: No results for input(s): TSH, T4TOTAL, FREET4, T3FREE, THYROIDAB in the last 72 hours. Anemia Panel: No results for input(s): VITAMINB12, FOLATE, FERRITIN, TIBC, IRON, RETICCTPCT in the last 72 hours. Sepsis Labs: No results for input(s): PROCALCITON, LATICACIDVEN in the last 168 hours.  Recent Results (from the past 240 hour(s))  Culture, blood (routine x 2)     Status:  None   Collection Time: 09/21/18 12:23 PM  Result Value Ref Range Status   Specimen Description BLOOD LEFT ANTECUBITAL  Final   Special Requests   Final    BOTTLES DRAWN AEROBIC ONLY Blood Culture results may not be optimal due to an inadequate volume of blood received in culture bottles   Culture   Final    NO GROWTH 5 DAYS Performed at [facility], [place] [address] [place], [place] [address]    Report Status 09/26/2018 FINAL  Final  Culture, blood (routine x 2)     Status: None   Collection Time: 09/21/18  3:45 PM  Result Value Ref Range Status   Specimen Description BLOOD RIGHT HAND  Final   Special Requests   Final    BOTTLES DRAWN AEROBIC ONLY Blood Culture results may not be optimal due to an inadequate volume of blood received in culture bottles   Culture   Final    NO GROWTH 5 DAYS Performed at [facility], [place] [address], [employer], [place] [address]    Report Status 09/26/2018 FINAL  Final         Radiology Studies: No results found.      Scheduled Meds:  amoxicillin-clavulanate  1 tablet Oral Q12H   cloNIDine  0.2 mg Transdermal Weekly   diazepam  2.5 mg Oral BID   feeding supplement  1 Container Oral BID BM   feeding supplement (ENSURE ENLIVE)  237 mL Oral BID PC   feeding supplement (PRO-STAT SUGAR FREE 64)  30 mL Oral TID WC   folic acid  1 mg Oral Daily   insulin aspart  0-9 Units Subcutaneous TID WC   lactulose  30 g Oral TID   multivitamin with minerals  1 tablet Oral Daily   nadolol  20 mg Oral Daily   saccharomyces boulardii  250 mg Oral BID   sodium chloride flush  10-40 mL Intracatheter Q12H   thiamine  100 mg Oral Daily   Continuous Infusions:  DAPTOmycin (CUBICIN)  IV 500 mg (09/29/18 2030)   potassium chloride       LOS: 10 days        Aline August, MD [facility] 09/30/2018, 8:02 AM

## 2018-09-30 NOTE — Progress Notes (Signed)
Nutrition Follow-up  RD working remotely.  DOCUMENTATION CODES:   Not applicable  INTERVENTION:   -Continue Ensure Enlive po to BID, each supplement provides 350 kcal and 20 grams of protein -Continue Boost Breeze po BID, each supplement provides 250 kcal and 9 grams of protein -Continue 30 ml Prostat TID, each supplement provides 100 kcals and 15 grams protein -Continue MVI with minerals daily -Continue feeding assistance with meals  NUTRITION DIAGNOSIS:   Increased nutrient needs related to chronic illness(alcoholic cirrhosis) as evidenced by estimated needs.  Ongoing  GOAL:   Patient will meet greater than or equal to 90% of their needs  Progressing  MONITOR:   PO intake, Supplement acceptance, Labs, Weight trends, Skin, I & O's  REASON FOR ASSESSMENT:   Consult Assessment of nutrition requirement/status, Diet education  ASSESSMENT:   Mark [name] is a 63 y.o. male with medical history significant of alcoholic liver cirrhosis, pancytopenia, HTN, CAD, polysubstance abuse(alcohol, heroin, marijuana), CKD stage III, and MSSA bacteremia in 04/2018; who presents with complaints of worsening lower back pain.  Pain is described as severe. Reports that he has had this pain intermittently for years, but has been more constant.  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  Denies having any significant fever, change in bowel habits, nausea, vomiting, diarrhea, shortness of breath, or chest pain.  He tried over-the-counter NSAIDs without relief.  Patient still reports drinking beer on a daily basis and last reports snorting heroin prior to coming into the emergency department.  In January patient was admitted for MSSA bacteremia and plan was to treat with 3 weeks of IV Ancef to be stopped originally on 1/27.  However, due to home health concerns of going to the patient's house IV antibiotics were discontinued on 1/24 with removal  of the PICC line after discussions with infectious disease.  Patient had just recently been seen in the emergency department for left lower extremity cellulitis on 4/26 treated with oral antibiotics of Bactrim and topical ketoconazole.  Denies having any issues with large left lower extremity at this time.  Lastly, he admits to eating normal, but states that he has been more sleepy where sometimes will not get up to eat.  5/30- s/p BSE- advanced to dysphagia 1 diet with nectar thick liquids 6/1- s/p MBSS- advanced to dysphagia 3 diet with thin liquids, s/p PICC placement  Reviewed I/O's: -2 L x 24 hours and +10 L since admission  UOP: 600 ml x 24 hours  Stool output: 2 L x 24 hours  Per neurosurgery notes, pt is a poor surgical candidate and is refusing surgical interventions at this time. IR reports abscess not large enough to drain.  [name] intake has improved since last visit; noted meal completion 25-75%. Pt is also accepting of Prostat, Ensure Enlive, and Boost Breeze supplements. Per MD notes, pt family would like to avoid heroic measures.   Medications reviewed and include lactulose.   [name] mentation has improved, but remains lethargic at times. Meal completion 0-25%. Palliative care goals of care meeting note reviewed; plan to treat the treatable to prevent re-hospitalization as well as ICU and intubation. Family desires to take pt home at discharge.   Labs reviewed: K: 2.7, CBGS: 82-126 (inpatient orders 0-9 units insulin aspart TID with meals).  Diet Order:   Diet Order            DIET DYS 3 Room service appropriate? No; Fluid consistency: Thin  Diet effective now              EDUCATION NEEDS:   No education needs have been identified at this time  Skin:  Skin Assessment: Reviewed RN Assessment  Last BM:  09/29/18 (2000 ml via rectal pouch)  Height:   Ht Readings from Last 1 Encounters:  09/20/18 5\' 5"  (1.651 m)    Weight:   Wt Readings from Last 1 Encounters:   09/20/18 64.7 kg    Ideal Body Weight:  61.8 kg  BMI:  Body mass index is 23.74 kg/m.  Estimated Nutritional Needs:   Kcal:  2050-2250  Protein:  100-115 grams  Fluid:  > 2 L    Mark [name] A. Mark [name], RD, LDN, [place] Registered Dietitian II Certified Diabetes Care and Education Specialist Pager: [phone] After hours Pager: [phone]

## 2018-09-30 NOTE — Evaluation (Signed)
Physical Therapy Evaluation Patient Details Name: Mark [name] MRN: [id number] DOB: [birth year] Today's Date: 09/30/2018   History of Present Illness  63 year old male with history of alcoholic liver cirrhosis, pancytopenia, hypertension, coronary artery disease, polysubstance abuse (alcohol, heroin, marijuana), CKD stage III and MSSA bacteremia in 04/2018 presented with worsening lower back pain.  COVID-19 rapid testing was negative on admission.  MRI of the lumbar spine revealed enhancement throughout the epidural space of L3 through the sacrum concerning for possible phlegmon/abscess, L5-S1 marrow enhancement concerning for septic arthritis, and spinal stenosis L3-S1    Clinical Impression  Pt admitted with above diagnosis. Pt currently with functional limitations due to the deficits listed below (see PT Problem List).  PTA pt living with wife, reports mod I with intermittent use of SPC. Today with high levels of pain limiting his mobility, standing in flexed posture EOB with MOD, side stepping with premature sitting. Unsafe to return home at this given time.  Pt will benefit from skilled PT to increase their independence and safety with mobility to allow discharge to the venue listed below.       Follow Up Recommendations CIR    Equipment Recommendations  (TBD)    Recommendations for Other Services       Precautions / Restrictions Precautions Precautions: Fall Restrictions Weight Bearing Restrictions: No      Mobility  Bed Mobility Overal bed mobility: Needs Assistance Bed Mobility: Supine to Sit     Supine to sit: Mod assist     General bed mobility comments: mod A to come to sitting  Transfers Overall transfer level: Needs assistance Equipment used: Rolling walker (2 wheeled) Transfers: Sit to/from Stand Sit to Stand: Mod assist         General transfer comment: mod A several attempts with and without RW, imrpoved with RW still flexed, poor stability,  patient states legs hurt. side stepped along bed premature sit down.  Ambulation/Gait             General Gait Details: deferred  Stairs            Wheelchair Mobility    Modified Rankin (Stroke Patients Only)       Balance Overall balance assessment: Needs assistance   Sitting balance-[name]: Fair                                       Pertinent Vitals/Pain Pain Assessment: Faces Faces Pain Scale: Hurts even more Pain Location: back Pain Descriptors / Indicators: Aching;Burning Pain Intervention(s): Limited activity within patient's tolerance    Home Living Family/patient expects to be discharged to:: Private residence Living Arrangements: Spouse/significant other Available Help at Discharge: Family Type of Home: House Home Access: Stairs to enter Entrance Stairs-Rails: None Entrance Stairs-Number of Steps: 2 Home Layout: One level Home Equipment: Environmental consultant - 2 wheels;Cane - single point      Prior Function Level of Independence: Independent         Comments: says he uses a SPC, history obtained from prior chart, patient nto respodning clearly today moaning in pain and distracted     Hand Dominance        Extremity/Trunk Assessment   Upper Extremity Assessment Upper Extremity Assessment: Generalized weakness    Lower Extremity Assessment Lower Extremity Assessment: Generalized weakness       Communication   Communication: No difficulties  Cognition Arousal/Alertness: Awake/alert Behavior During Therapy:  WFL for tasks assessed/performed Overall Cognitive Status: Within Functional Limits for tasks assessed                                        General Comments      Exercises     Assessment/Plan    PT Assessment Patient needs continued PT services  PT Problem List Decreased strength       PT Treatment Interventions DME instruction;Gait training;Stair training;Functional mobility  training;Therapeutic exercise    PT Goals (Current goals can be found in the Care Plan section)  Acute Rehab PT Goals Patient Stated Goal: less pain PT Goal Formulation: With patient Time For Goal Achievement: 10/14/18 Potential to Achieve Goals: Good    Frequency Min 3X/week   Barriers to discharge Decreased caregiver support      Co-evaluation               AM-PAC PT "6 Clicks" Mobility  Outcome Measure Help needed turning from your back to your side while in a flat bed without using bedrails?: A Little Help needed moving from lying on your back to sitting on the side of a flat bed without using bedrails?: A Little Help needed moving to and from a bed to a chair (including a wheelchair)?: A Lot Help needed standing up from a chair using your arms (e.g., wheelchair or bedside chair)?: A Lot Help needed to walk in hospital room?: Total Help needed climbing 3-5 steps with a railing? : Total 6 Click Score: 12    End of Session Equipment Utilized During Treatment: Gait belt Activity Tolerance: Patient tolerated treatment well Patient left: in bed Nurse Communication: Mobility status PT Visit Diagnosis: Unsteadiness on feet (R26.81)    Time: 1720-1750 PT Time Calculation (min) (ACUTE ONLY): 30 min   Charges:   PT Evaluation $PT Eval Moderate Complexity: 1 Mod PT Treatments $Therapeutic Activity: 8-22 mins        Reinaldo Berber, PT, DPT Acute Rehabilitation Services Pager: [phone] Office: [place] 09/30/2018, 6:10 PM

## 2018-09-30 NOTE — Progress Notes (Signed)
Dr. Hilbert Bible notified of potassium of 2.7

## 2018-09-30 NOTE — Progress Notes (Signed)
Pharmacy Antibiotic Note  Mark [name] is a 63 y.o. male admitted on 09/19/2018 with epidural abscess and septic arthritis of L3-L4 facet joints complicated with MRSA bacteremia. Pharmacy consulted for Daptomycin dosing.   The patient's AKI has resolved and Daptomycin dosing remains appropriate. CK on 6/3 was 493 - will recheck again on 6/6 to monitor rise. Stop date added for  6/8 to complete 14d duration - then plan per ID is to transition to Doxy po.   Plan:  - Continue Daptomycin 500 mg IV every 24 hours - Next CK check on 6/6 - Will continue to follow renal function, culture results, LOT, and antibiotic de-escalation plans   Height: 5\' 5"  (165.1 cm) Weight: 142 lb 10.2 oz (64.7 kg) IBW/kg (Calculated) : 61.5  Temp (24hrs), Avg:97.6 F (36.4 C), Min:97.5 F (36.4 C), Max:97.7 F (36.5 C)  Recent Labs  Lab 09/23/18 1757 09/24/18 0213 09/25/18 0512 09/29/18 0442 09/30/18 0443  WBC  --  13.8* 11.8*  --  14.5*  CREATININE 1.52* 1.41* 1.17 0.91 0.77    Estimated Creatinine Clearance: 80.1 mL/min (by C-G formula based on SCr of 0.77 mg/dL).    No Known Allergies  Antimicrobials this admission: 5/25 Vancomycin  >> 5/26 5/25 Zosyn >> 5/26; 5/28>5/29 5/26 Cefazolin >> 5/26 5/26 Daptomycin >> [6/8], then doxy x 4 weeks - CKD 12 (5/27), 493 (6/3) 5/29 Unasyn>> 6/1 6/1 Augmentin >> (6/5)  Dose adjustments this admission: N/A  Microbiology results: 5/26 BCx: ngtd 5/25 BCx: MRSA 5/25 COVID negative  Thank you for allowing pharmacy to be a part of this patient's care.  Alycia Rossetti, PharmD, BCPS Clinical Pharmacist Clinical phone for 09/30/2018: [phone] 09/30/2018 2:12 PM   **Pharmacist phone directory can now be found on [email or web address] ([email or web address]).  Listed under [place].

## 2018-10-01 ENCOUNTER — Inpatient Hospital Stay (HOSPITAL_COMMUNITY): Payer: Medicare Other

## 2018-10-01 DIAGNOSIS — F1910 Other psychoactive substance abuse, uncomplicated: Secondary | ICD-10-CM

## 2018-10-01 DIAGNOSIS — K7290 Hepatic failure, unspecified without coma: Secondary | ICD-10-CM

## 2018-10-01 DIAGNOSIS — N179 Acute kidney failure, unspecified: Secondary | ICD-10-CM

## 2018-10-01 DIAGNOSIS — R7881 Bacteremia: Secondary | ICD-10-CM

## 2018-10-01 DIAGNOSIS — D696 Thrombocytopenia, unspecified: Secondary | ICD-10-CM

## 2018-10-01 DIAGNOSIS — M48061 Spinal stenosis, lumbar region without neurogenic claudication: Secondary | ICD-10-CM

## 2018-10-01 DIAGNOSIS — D638 Anemia in other chronic diseases classified elsewhere: Secondary | ICD-10-CM

## 2018-10-01 LAB — CBC WITH DIFFERENTIAL/PLATELET
Abs Immature Granulocytes: 0.15 10*3/uL — ABNORMAL HIGH (ref 0.00–0.07)
Basophils Absolute: 0 10*3/uL (ref 0.0–0.1)
Basophils Relative: 0 %
Eosinophils Absolute: 0 10*3/uL (ref 0.0–0.5)
Eosinophils Relative: 0 %
HCT: 23 % — ABNORMAL LOW (ref 39.0–52.0)
Hemoglobin: 7.5 g/dL — ABNORMAL LOW (ref 13.0–17.0)
Immature Granulocytes: 1 %
Lymphocytes Relative: 3 %
Lymphs Abs: 0.6 10*3/uL — ABNORMAL LOW (ref 0.7–4.0)
MCH: 33 pg (ref 26.0–34.0)
MCHC: 32.6 g/dL (ref 30.0–36.0)
MCV: 101.3 fL — ABNORMAL HIGH (ref 80.0–100.0)
Monocytes Absolute: 1 10*3/uL (ref 0.1–1.0)
Monocytes Relative: 5 %
Neutro Abs: 19.6 10*3/uL — ABNORMAL HIGH (ref 1.7–7.7)
Neutrophils Relative %: 91 %
Platelets: 194 10*3/uL (ref 150–400)
RBC: 2.27 MIL/uL — ABNORMAL LOW (ref 4.22–5.81)
RDW: 19.4 % — ABNORMAL HIGH (ref 11.5–15.5)
WBC: 21.5 10*3/uL — ABNORMAL HIGH (ref 4.0–10.5)
nRBC: 0 % (ref 0.0–0.2)

## 2018-10-01 LAB — GLUCOSE, CAPILLARY
Glucose-Capillary: 117 mg/dL — ABNORMAL HIGH (ref 70–99)
Glucose-Capillary: 121 mg/dL — ABNORMAL HIGH (ref 70–99)
Glucose-Capillary: 100 mg/dL — ABNORMAL HIGH (ref 70–99)
Glucose-Capillary: 102 mg/dL — ABNORMAL HIGH (ref 70–99)

## 2018-10-01 LAB — BASIC METABOLIC PANEL
Anion gap: 7 (ref 5–15)
BUN: 15 mg/dL (ref 8–23)
CO2: 23 mmol/L (ref 22–32)
Calcium: 7.9 mg/dL — ABNORMAL LOW (ref 8.9–10.3)
Chloride: 112 mmol/L — ABNORMAL HIGH (ref 98–111)
Creatinine, Ser: 0.81 mg/dL (ref 0.61–1.24)
GFR calc Af Amer: >60 mL/min (ref >60)
GFR calc non Af Amer: >60 mL/min (ref >60)
Glucose, Bld: 125 mg/dL — ABNORMAL HIGH (ref 70–99)
Potassium: 3.3 mmol/L — ABNORMAL LOW (ref 3.5–5.1)
Sodium: 142 mmol/L (ref 135–145)

## 2018-10-01 LAB — MAGNESIUM: Magnesium: 2.1 mg/dL (ref 1.7–2.4)

## 2018-10-01 MED ORDER — METOCLOPRAMIDE HCL 5 MG/ML IJ SOLN
10.0000 mg | Freq: Once | INTRAMUSCULAR | Status: AC
  Administered 2018-10-01: 10 mg via INTRAVENOUS
  Filled 2018-10-01: qty 2

## 2018-10-01 MED ORDER — LACTULOSE 10 GM/15ML PO SOLN
20.0000 g | Freq: Three times a day (TID) | ORAL | Status: DC
  Administered 2018-10-01 – 2018-10-04 (×5): 20 g via ORAL
  Filled 2018-10-01 (×7): qty 30

## 2018-10-01 MED ORDER — PANTOPRAZOLE SODIUM 40 MG PO TBEC
40.0000 mg | DELAYED_RELEASE_TABLET | Freq: Two times a day (BID) | ORAL | Status: DC
  Administered 2018-10-01 – 2018-10-04 (×8): 40 mg via ORAL
  Filled 2018-10-01 (×7): qty 1

## 2018-10-01 NOTE — Progress Notes (Signed)
   10/01/18 1100  Clinical Encounter Type  Visited With Patient  Visit Type Initial;Spiritual support  Referral From Nurse  Consult/Referral To Chaplain  Spiritual Encounters  Spiritual Needs Emotional;Prayer  Stress Factors  Patient Stress Factors Health changes;Major life changes   While rounding. PT called for me. PT was wanting to talk and mentioned he wanted to go home to his family. I offered spiritual care with words of encouragement, ministry of presence, and prayer. After prayer, PT asked me to help him stand. I respectfully stated that I cannot help him and asked him if he asked to Nurse for help. Pt said Nurse doesn't want to help. I informed his Nurse.  Chaplain Fidel Levy [phone]

## 2018-10-01 NOTE — Consult Note (Signed)
Physical Medicine and Rehabilitation Consult Reason for Consult: Decreased functional mobility Referring Physician: [facility]   HPI: Mark [name] is a 63 y.o. right-handed male with significant past medical history of alcoholic liver cirrhosis, pancytopenia, chronic anemia, hypertension, coronary artery disease, intravenous polysubstance abuse, CKD stage III and MSSA bacteremia in January 2020 and completed a 3-week course of intravenous Ancef.  History taken from chart review and patient.  Patient also seen in the emergency department for left lower extremity cellulitis 08/22/2018 treated with oral antibiotics.  Patient lives with spouse. One level home with 2 steps to entry.  He uses a straight point cane, but requests a wheelchair.  Patient reportedly works as a Dealer.  Presented 09/20/2018 with increasing low back pain.  Denied any fever or change in bowel or bladder habits.  In the ED noted WBC 4.9, hemoglobin 9.1, creatinine 1.82, total bilirubin 1.8, lactic acid 2.1, ammonia level 114, blood cultures negative.  Urine drug screen positive for opioids.  COVID screening negative.  Renal ultrasound normal appearance of kidneys.  MRI lumbar spine showed abnormal enhancement throughout the epidural space extending from approximately L3 through the sacrum concerning for infection as well as associated moderate to severe spinal stenosis at L3-4 through L5-S1.  Neurosurgery Dr. Venetia Constable consulted, not felt to be a good surgical candidate, recommended VIR follow-up, however collection not large enough to drain. Infectious disease consulted and currently on daptomycin x2 weeks followed by 4 weeks of doxycycline.  Hospital course further complicated by chronic anemia.  Patient has had waxing and waning mental status during hospitalization close monitoring with the use of opioids and sedatives with agitation felt to be related to hepatic encephalopathy.  Therapy evaluations completed with  recommendations of physical medicine rehab consult.  Review of Systems  Constitutional: Negative for chills and fever.  HENT: Negative for hearing loss.   Eyes: Negative for blurred vision and double vision.  Respiratory: Negative for cough.   Cardiovascular: Positive for leg swelling. Negative for chest pain and palpitations.  Gastrointestinal: Positive for constipation. Negative for heartburn, nausea and vomiting.  Genitourinary: Negative for dysuria, flank pain and hematuria.  Musculoskeletal: Positive for back pain and myalgias.  Skin: Negative for rash.  Neurological: Positive for weakness.  All other systems reviewed and are negative.  Past Medical History:  Diagnosis Date   Alcohol abuse    Anemia    Arthritis    Ascites    Cirrhosis ([place])    Coffee ground emesis    Dehydration 06/17/2017   Febrile illness    Heart murmur    Hyperlipidemia    Hypertension    Leg swelling    Myocardial infarction ([place]) 2012   Preop cardiovascular exam 04/14/2013   Sepsis ([place]) 06/17/2017   Septic shock ([place]) 06/18/2017   SIRS (systemic inflammatory response syndrome) ([place]) 07/11/2017   Stroke ([place]) 2012   no deficits   Thrombocytopenia ([place])    Past Surgical History:  Procedure Laterality Date   COLONOSCOPY WITH PROPOFOL N/A 02/07/2016   Procedure: COLONOSCOPY WITH PROPOFOL;  Surgeon: Milus Banister, MD;  Location: [facility] ENDOSCOPY;  Service: Endoscopy;  Laterality: N/A;   ESOPHAGOGASTRODUODENOSCOPY (EGD) WITH PROPOFOL N/A 02/07/2016   Procedure: ESOPHAGOGASTRODUODENOSCOPY (EGD) WITH PROPOFOL;  Surgeon: Milus Banister, MD;  Location: [facility] ENDOSCOPY;  Service: Endoscopy;  Laterality: N/A;   ESOPHAGOGASTRODUODENOSCOPY (EGD) WITH PROPOFOL N/A 06/22/2017   Procedure: ESOPHAGOGASTRODUODENOSCOPY (EGD) WITH PROPOFOL;  Surgeon: Jerene Bears, MD;  Location: [facility] ENDOSCOPY;  Service: Gastroenterology;  Laterality: N/A;   HERNIA REPAIR Right    inguinal   KNEE ARTHROSCOPY      bilateral/  12/14   TEE WITHOUT CARDIOVERSION N/A 05/13/2018   Procedure: TRANSESOPHAGEAL ECHOCARDIOGRAM (TEE);  Surgeon: [name], MD;  Location: [facility];  Service: Cardiovascular;  Laterality: N/A;   TOTAL KNEE ARTHROPLASTY Right 05/02/2013   Procedure: RIGHT TOTAL KNEE ARTHROPLASTY;  Surgeon: Mauri Pole, MD;  Location: [facility];  Service: Orthopedics;  Laterality: Right;   Family History  Problem Relation Age of Onset   Hypertension Father    Diabetes Father    Dementia Mother    Lupus Sister    Social History:  reports that he quit smoking about 2 years ago. His smoking use included cigarettes. He has a 5.00 pack-year smoking history. He has never used smokeless tobacco. He reports current alcohol use. He reports current drug use. Drugs: Marijuana and Heroin. Allergies: No Known Allergies Medications Prior to Admission  Medication Sig Dispense Refill   folic acid (FOLVITE) 1 MG tablet TAKE 1 TABLET BY MOUTH EVERY DAY 30 tablet 1   furosemide (LASIX) 20 MG tablet TAKE 1 TABLET BY MOUTH TWICE DAILY 60 tablet 1   ketoconazole (NIZORAL) 2 % shampoo Apply 1 application topically 2 (two) times a week. 120 mL 0   lactulose (CHRONULAC) 10 GM/15ML solution TAKE 45 MILLILITERS BY MOUTH TWICE DAILY (Patient taking differently: Take 30 g by mouth 2 (two) times daily. Taking 70ml as needed) 2700 mL 3   nadolol (CORGARD) 20 MG tablet TAKE 1 TABLET BY MOUTH EVERY DAY (Patient taking differently: Take 20 mg by mouth daily. TAKE 1 TABLET BY MOUTH EVERY DAY) 30 tablet 1   triamcinolone (KENALOG) 0.025 % ointment Apply 1 application topically 2 (two) times daily as needed (rash).     albuterol (PROVENTIL HFA;VENTOLIN HFA) 108 (90 Base) MCG/ACT inhaler Inhale 2 puffs into the lungs every 6 (six) hours as needed for wheezing or shortness of breath. (Patient not taking: Reported on 06/03/2018) 1 Inhaler 1   diphenhydrAMINE-zinc acetate (BENADRYL EXTRA STRENGTH) cream Apply 1  application topically 3 (three) times daily as needed for itching. (Patient not taking: Reported on 06/03/2018) 28.4 g 0   guaiFENesin (MUCINEX) 600 MG 12 hr tablet Take 1 tablet (600 mg total) by mouth 2 (two) times daily as needed for cough or to loosen phlegm. (Patient not taking: Reported on 06/03/2018) 20 tablet 0   silver sulfADIAZINE (SILVADENE) 1 % cream Apply 1 application topically daily as needed. To  Burn (Patient not taking: Reported on 06/03/2018)     spironolactone (ALDACTONE) 50 MG tablet Take 1 tablet (50 mg total) by mouth 2 (two) times daily. (Patient not taking: Reported on 09/20/2018) 120 tablet 0   thiamine 100 MG tablet Take 1 tablet (100 mg total) by mouth daily. (Patient not taking: Reported on 06/03/2018) 30 tablet 1    Home: [place] expects to be discharged to:: Private residence Living Arrangements: Spouse/significant other Available Help at Discharge: Family Type of Home: House Home Access: Stairs to enter Technical brewer of Steps: 2 Entrance Stairs-Rails: None Home Layout: One level Bathroom Shower/Tub: Tub/shower unit [place]: Environmental consultant - 2 wheels, [place] - single point  Functional History: Prior Function Level of Independence: Independent Comments: says he uses a SPC, history obtained from prior chart, patient nto respodning clearly today moaning in pain and distracted Functional Status:  Mobility: Bed Mobility Overal bed mobility: Needs Assistance Bed Mobility: Supine to Sit Supine  to sit: Mod assist General bed mobility comments: mod A to come to sitting Transfers Overall transfer level: Needs assistance Equipment used: Rolling walker (2 wheeled) Transfers: Sit to/from Stand Sit to Stand: Mod assist General transfer comment: mod A several attempts with and without RW, imrpoved with RW still flexed, poor stability, patient states legs hurt. side stepped along bed premature sit down. Ambulation/Gait General Gait Details:  deferred    ADL:    Cognition: Cognition Overall Cognitive Status: Within Functional Limits for tasks assessed Orientation Level: Oriented X4 Cognition Arousal/Alertness: Awake/alert Behavior During Therapy: WFL for tasks assessed/performed Overall Cognitive Status: Within Functional Limits for tasks assessed  Blood pressure (!) 141/71, pulse 72, temperature 98.4 F (36.9 C), temperature source Oral, resp. rate 18, height 5\' 5"  (1.651 m), weight 64.7 kg, SpO2 99 %. Physical Exam  Vitals reviewed. Constitutional: He appears well-developed and well-nourished.  HENT:  Head: Normocephalic and atraumatic.  Eyes: EOM are normal. Right eye exhibits no discharge. Left eye exhibits no discharge.  Respiratory: Effort normal. No respiratory distress.  GI: He exhibits distension.  Musculoskeletal:     Comments: No edema or tenderness in extremities  Neurological: He is alert.  He does provide his name and age but very limited medical historian.   Follows simple commands. Motor: Bilateral upper extremities: 4+/5 proximal distal Bilateral lower extremities: Hip flexion 2+/5, knee extension 3/5, ankle dorsiflexion 4/5  Psychiatric: He has a normal mood and affect. His behavior is normal.    Results for orders placed or performed during the hospital encounter of 09/19/18 (from the past 24 hour(s))  Glucose, capillary     Status: Abnormal   Collection Time: 09/30/18  6:39 AM  Result Value Ref Range   Glucose-Capillary 104 (H) 70 - 99 mg/dL  Glucose, capillary     Status: Abnormal   Collection Time: 09/30/18 12:33 PM  Result Value Ref Range   Glucose-Capillary 102 (H) 70 - 99 mg/dL  Glucose, capillary     Status: Abnormal   Collection Time: 09/30/18  4:36 PM  Result Value Ref Range   Glucose-Capillary 100 (H) 70 - 99 mg/dL  Glucose, capillary     Status: Abnormal   Collection Time: 09/30/18 11:01 PM  Result Value Ref Range   Glucose-Capillary 129 (H) 70 - 99 mg/dL  CBC with  Differential/Platelet     Status: Abnormal   Collection Time: 10/01/18  4:00 AM  Result Value Ref Range   WBC 21.5 (H) 4.0 - 10.5 K/uL   RBC 2.27 (L) 4.22 - 5.81 MIL/uL   Hemoglobin 7.5 (L) 13.0 - 17.0 g/dL   HCT 23.0 (L) 39.0 - 52.0 %   MCV 101.3 (H) 80.0 - 100.0 fL   MCH 33.0 26.0 - 34.0 pg   MCHC 32.6 30.0 - 36.0 g/dL   RDW 19.4 (H) 11.5 - 15.5 %   Platelets 194 150 - 400 K/uL   nRBC 0.0 0.0 - 0.2 %   Neutrophils Relative % 91 %   Neutro Abs 19.6 (H) 1.7 - 7.7 K/uL   Lymphocytes Relative 3 %   Lymphs Abs 0.6 (L) 0.7 - 4.0 K/uL   Monocytes Relative 5 %   Monocytes Absolute 1.0 0.1 - 1.0 K/uL   Eosinophils Relative 0 %   Eosinophils Absolute 0.0 0.0 - 0.5 K/uL   Basophils Relative 0 %   Basophils Absolute 0.0 0.0 - 0.1 K/uL   Immature Granulocytes 1 %   Abs Immature Granulocytes 0.15 (H) 0.00 - 0.07 K/uL  Basic metabolic panel     Status: Abnormal   Collection Time: 10/01/18  4:00 AM  Result Value Ref Range   Sodium 142 135 - 145 mmol/L   Potassium 3.3 (L) 3.5 - 5.1 mmol/L   Chloride 112 (H) 98 - 111 mmol/L   CO2 23 22 - 32 mmol/L   Glucose, Bld 125 (H) 70 - 99 mg/dL   BUN 15 8 - 23 mg/dL   Creatinine, Ser 0.81 0.61 - 1.24 mg/dL   Calcium 7.9 (L) 8.9 - 10.3 mg/dL   GFR calc non Af Amer >60 >60 mL/min   GFR calc Af Amer >60 >60 mL/min   Anion gap 7 5 - 15  Magnesium     Status: None   Collection Time: 10/01/18  4:00 AM  Result Value Ref Range   Magnesium 2.1 1.7 - 2.4 mg/dL  Type and screen [place]     Status: None   Collection Time: 10/01/18  4:10 AM  Result Value Ref Range   ABO/RH(D) B POS    Antibody Screen NEG    Sample Expiration      10/04/2018,2359 Performed at [facility], [place] [address] [name],  [address]    Dg Abd Portable 1v  Result Date: 10/01/2018 CLINICAL DATA:  Persistent vomiting EXAM: PORTABLE ABDOMEN - 1 VIEW COMPARISON:  06/17/2017 abdominal CT FINDINGS: Moderate gaseous distention of the stomach. Overall  nonobstructive bowel gas pattern. No concerning mass effect or calcification. Elevated right diaphragm also seen on prior. IMPRESSION: Moderate gaseous distention of the stomach. Electronically Signed   By: Monte Fantasia M.D.   On: 10/01/2018 04:38    Assessment/Plan: Diagnosis: Spinal stenosis with epidural infection Labs and images (see above) independently reviewed.  Records reviewed and summated above.  1. Does the need for close, 24 hr/day medical supervision in concert with the patient's rehab needs make it unreasonable for this patient to be served in a less intensive setting? Potentially 2. Co-Morbidities requiring supervision/potential complications: alcoholic liver cirrhosis (avoid hepatotoxic meds), pancytopenia, chronic anemia (repeat labs, consider transfusion if necessary to ensure appropriate perfusion for increased activity tolerance), HTN (monitor and provide prns in accordance with increased physical exertion and pain), coronary artery disease, intravenous polysubstance abuse, CKD stage III (avoid nephrotoxic meds), MSSA bacteremia in January 2020, hypokalemia (continue to monitor and replete as necessary), leukocytosis (repeat labs, cont to monitor for signs and symptoms of infection, further workup if indicated) 3. Due to safety, disease management, medication administration, pain management and patient education, does the patient require 24 hr/day rehab nursing? Yes 4. Does the patient require coordinated care of a physician, rehab nurse, PT (1-2 hrs/day, 5 days/week), OT (1-2 hrs/day, 5 days/week) and SLP (1-2 hrs/day, 5 days/week) to address physical and functional deficits in the context of the above medical diagnosis(es)? Yes Addressing deficits in the following areas: balance, endurance, locomotion, strength, transferring, bathing, dressing, toileting, cognition and psychosocial support 5. Can the patient actively participate in an intensive therapy program of at least 3 hrs  of therapy per day at least 5 days per week? Potentially 6. The potential for patient to make measurable gains while on inpatient rehab is good and fair 7. Anticipated functional outcomes upon discharge from inpatient rehab are supervision and min assist  with PT, supervision and min assist with OT, modified independent and supervision with SLP. 8. Estimated rehab length of stay to reach the above functional goals is: 10-14 days. 9. Anticipated D/C setting: Home 10.  Anticipated post D/C treatments: HH therapy and Home excercise program 11. Overall Rehab/Functional Prognosis: good and fair  RECOMMENDATIONS: This patient's condition is appropriate for continued rehabilitative care in the following setting: Patient refusing CIR and wants to go home.  Recommend home with [facility] if caregiver support available. Patient has agreed to participate in recommended program. Potentially Note that insurance prior authorization may be required for reimbursement for recommended care.  Comment: Rehab Admissions Coordinator to follow up.   I have personally performed a face to face diagnostic evaluation, including, but not limited to relevant history and physical exam findings, of this patient and developed relevant assessment and plan.  Additionally, I have reviewed and concur with the physician assistant's documentation above.   Delice Lesch, MD, ABPMR Lavon Paganini Angiulli, PA-C 10/01/2018

## 2018-10-01 NOTE — Progress Notes (Signed)
Patient ID: Mark [name], male   DOB: April 03, 1956, 63 y.o.   MRN: [id number]  PROGRESS NOTE    Mark [name]  JZP:[id number] DOB: [birth year] DOA: 09/19/2018 PCP: [name], MD   Brief Narrative:  63 year old male with history of alcoholic liver cirrhosis, pancytopenia, hypertension, coronary artery disease, polysubstance abuse (alcohol, heroin, marijuana), CKD stage III and MSSA bacteremia in 04/2018 presented with worsening lower back pain.  COVID-19 rapid testing was negative on admission.  MRI of the lumbar spine revealed enhancement throughout the epidural space of L3 through the sacrum concerning for possible phlegmon/abscess, L5-S1 marrow enhancement concerning for septic arthritis, and spinal stenosis L3-S1. He was started on broad-spectrum antibiotics.  Neurosurgery was consulted.  IR was also consulted.  IR thought that there is no abscess/collection large enough to aspirate or drain.  Neurosurgery subsequently recommended medical management with antibiotics for now as patient is very high risk for surgical intervention.  He was found to have MRSA bacteremia and ID was consulted.  Antibiotics was switched to daptomycin.  Hospitalization has been complicated with waxing and waning mental status.  Palliative care was consulted and CODE STATUS has been changed to DNR.  He was also started on Unasyn for aspiration pneumonia.  ID recommends intravenous daptomycin for 2 weeks and 4 weeks of doxycycline.  ID has signed off.  Assessment & Plan:   Principal Problem:   MRSA bacteremia Active Problems:   Cirrhosis, alcoholic ([place])   Polysubstance abuse ([place])   Hyponatremia   Pancytopenia ([name])   Hepatic encephalopathy ([name])   Alcohol abuse   Septic arthritis ([name])   Anemia of chronic disease   Hypoalbuminemia   Hypoglycemia without diagnosis of diabetes mellitus   Epidural abscess   Abscess in epidural space of lumbar spine   Aspiration pneumonia ([name])  Septic  arthritis/epidural abscess of lumbar spine -MRI of the lumbar spine revealed enhancement throughout the epidural space of L3 through the sacrum concerning for possible phlegmon/abscess, L5-S1 marrow enhancement concerning for septic arthritis, and spinal stenosis L3-S1 -Patient was initially started on broad-spectrum antibiotics vancomycin and Zosyn.  Currently on daptomycin as per ID. -Neurosurgery had initially recommended IR consultation for probable drainage/biopsy -As per IR, the abscess/collection was not large enough to aspirate or drain.  IR has notified Dr. Zada Finders. -Neurosurgery found patient to be high risk for any surgical intervention and is recommended conservative management with IV antibiotics only. -No evidence of loss of bowel or bladder control for now -Continue pain management and supportive care.  Patient complaining of increasing pain.  Increase oxycodone dose.  Might need long-acting opiate.  Monitor mental status. -Currently afebrile.  Leukocytosis has worsened. -Given issues with IV access, patient received 2 doses of Zyvox on 09/26/2018. -PICC line was placed on 09/27/2018 by IR.  Now back on daptomycin.  Monitor CK intermittently.  ID recommends 2 weeks of daptomycin, last date is 10/04/2018; followed by 4 weeks of doxycycline.  MRSA bacteremia -Repeat blood cultures have been negative since 09/21/2018 -2D echo: Preserved ejection fraction, no wall motion normalities, no vegetations appreciated on any valve.  Mild regurgitation and he tricuspid valve was appreciated by color flow Doppler -ID has signed off.  Antibiotic plan as above.  Aspiration pneumonia -Treated with Unasyn and has been switched to oral Augmentin.  Finish after 7-day of antibiotic therapy -Diet as per SLP recommendations  Leukocytosis -From above.  Worsening.  Will repeat blood cultures.  Chest x-ray.  Urinalysis with cultures.  Acute kidney injury with  metabolic acidosis  -Baseline creatinine of  0.95 on 08/22/2018.  Patient on Lasix and spironolactone at home which have remained on hold -Presented with creatinine of 1.72 which worsened up to 2.4 -Much improved.  Creatinine 0.77 today. -Renal ultrasound showed no obstructive uropathy or hydronephrosis -Strict input and output.  Daily weights.  Monitor creatinine  Anemia of chronic disease -Transfuse if hemoglobin is less than 7.  No signs of active bleeding.  Hypokalemia -Replace.  Repeat a.m. labs  Hypomagnesemia--improved.  Alcoholic cirrhosis with hepatic encephalopathy  Chronic thrombocytopenia -continue lactulose.  Patient intermittently refuses lactulose. -Diuretics on hold.  Continue nadolol. -Patient has had waxing and waning mental status during the hospitalization along with aspiration pneumonia as well.  We need to be careful with use of opioids and sedatives. -Lasix was restarted on 09/30/2018.  Monitor.  Might need to restart spironolactone as well.    Vomiting -Patient had overnight vomiting with question of coffee-ground.  Will monitor H&H.  If persistently vomits, might need GI evaluation.  Will start Protonix.  Polysubstance abuse -Patient admits to alcohol and recent heroin use prior to arrival -Continue multivitamin, thiamine and folate.  Hypoalbuminemia -Oral intake still very poor. -Follow nutrition recommendations  Generalized deconditioning -Overall prognosis is very poor.  Patient is noncompliant and still actively using drugs. -His mental status has been waxing and waning as well during the hospitalization.  Palliative care has been intermittently following the patient.  Patient has been made DNR as per palliative care discussions with family: Looking to maintain quality and avoid heroic invasive measures. -If condition worsens, consider hospice/comfort measures. -PT recommends CIR placement.  CIR consulted. -Over the last 2 days, patient has persistently requested to go home with PICC line.  I have  also persistently stated that he will not be able to go home with a PICC line given history of drug abuse.  If he wants to go home, he will have to leave [place].  Patient is alert, awake and almost oriented x3.  The only thing that he answers incorrectly is the month of the year.  He states that this is may of 2020. -I spoke to daughter Eben Burow on phone on 10/01/2018 in detail and explained to her that his condition is progressively getting worse and he is not ready to be discharged home.  If he insists on being discharged, she will need to go home with hospice.  Daughter agrees and is concerned about his father and will try and talk to his father and mother as well regarding him.  Dysphagia -Diet as per SLP recommendations.   DVT prophylaxis: SCDs Code Status: DNR Family Communication: Spoke to daughter Eben Burow on phone on 10/01/2018 Disposition Plan: Depends on clinical outcome.  Will need to stay inpatient till 10/04/2018 to finish 14 days of IV daptomycin.  Consultants: Neurosurgery/ID/IR/palliative care  Procedures:  2D echo: IMPRESSIONS 1. The left ventricle has normal systolic function with an ejection fraction of 60-65%. The cavity size was normal. There is mildly increased left ventricular wall thickness. Left ventricular diastolic parameters were normal. 2. The right ventricle has normal systolic function. The cavity was normal. There is no increase in right ventricular wall thickness. 3. Left atrial size was severely dilated. 4. No evidence of mitral valve stenosis. 5. Mild thickening of the aortic valve. Mild calcification of the aortic valve. No stenosis of the aortic valve  Antimicrobials:  Anti-infectives (From admission, onward)   Start     Dose/Rate Route Frequency Ordered Stop  10/05/18 1000  doxycycline (VIBRA-TABS) tablet 100 mg     100 mg Oral Every 12 hours 09/30/18 1414 11/02/18 0959   09/30/18 1000  amoxicillin-clavulanate (AUGMENTIN) 875-125 MG per  tablet 1 tablet  Status:  Discontinued     1 tablet Oral Every 12 hours 09/29/18 1548 09/29/18 1549   09/29/18 2200  amoxicillin-clavulanate (AUGMENTIN) 875-125 MG per tablet 1 tablet     1 tablet Oral Every 12 hours 09/29/18 1549 10/01/18 0959   09/27/18 2000  Ampicillin-Sulbactam (UNASYN) 3 g in sodium chloride 0.9 % 100 mL IVPB  Status:  Discontinued     3 g 200 mL/hr over 30 Minutes Intravenous Every 8 hours 09/26/18 2013 09/27/18 1237   09/27/18 2000  DAPTOmycin (CUBICIN) 500 mg in sodium chloride 0.9 % IVPB     500 mg 220 mL/hr over 30 Minutes Intravenous Daily 09/27/18 1617 10/04/18 2359   09/27/18 1245  amoxicillin-clavulanate (AUGMENTIN) 875-125 MG per tablet 1 tablet  Status:  Discontinued     1 tablet Oral Every 12 hours 09/27/18 1237 09/29/18 1548   09/27/18 1245  doxycycline (VIBRA-TABS) tablet 100 mg  Status:  Discontinued     100 mg Oral Every 12 hours 09/27/18 1237 09/27/18 1605   09/26/18 2200  amoxicillin-clavulanate (AUGMENTIN) 875-125 MG per tablet 1 tablet     1 tablet Oral Every 12 hours 09/26/18 2013 09/27/18 0939   09/26/18 2200  linezolid (ZYVOX) tablet 600 mg     600 mg Oral Every 12 hours 09/26/18 2013 09/27/18 0939   09/24/18 2000  DAPTOmycin (CUBICIN) 500 mg in sodium chloride 0.9 % IVPB  Status:  Discontinued     500 mg 220 mL/hr over 30 Minutes Intravenous Daily 09/23/18 2233 09/27/18 1455   09/24/18 1330  Ampicillin-Sulbactam (UNASYN) 3 g in sodium chloride 0.9 % 100 mL IVPB  Status:  Discontinued     3 g 200 mL/hr over 30 Minutes Intravenous Every 8 hours 09/24/18 1225 09/26/18 2013   09/24/18 0600  piperacillin-tazobactam (ZOSYN) IVPB 3.375 g  Status:  Discontinued     3.375 g 12.5 mL/hr over 240 Minutes Intravenous Every 8 hours 09/23/18 2233 09/24/18 1135   09/23/18 2300  piperacillin-tazobactam (ZOSYN) IVPB 3.375 g     3.375 g 100 mL/hr over 30 Minutes Intravenous  Once 09/23/18 2233 09/24/18 0318   09/21/18 1000  vancomycin (VANCOCIN) IVPB 750  mg/150 ml premix  Status:  Discontinued     750 mg 150 mL/hr over 60 Minutes Intravenous Every 24 hours 09/20/18 0949 09/21/18 0233   09/21/18 1000  DAPTOmycin (CUBICIN) 500 mg in sodium chloride 0.9 % IVPB  Status:  Discontinued     500 mg 220 mL/hr over 30 Minutes Intravenous Every 48 hours 09/21/18 0903 09/23/18 2233   09/21/18 0400  ceFAZolin (ANCEF) IVPB 2g/100 mL premix  Status:  Discontinued     2 g 200 mL/hr over 30 Minutes Intravenous Every 8 hours 09/21/18 0233 09/21/18 0900   09/20/18 1800  piperacillin-tazobactam (ZOSYN) IVPB 3.375 g  Status:  Discontinued     3.375 g 12.5 mL/hr over 240 Minutes Intravenous Every 8 hours 09/20/18 0929 09/20/18 0935   09/20/18 1500  piperacillin-tazobactam (ZOSYN) IVPB 3.375 g  Status:  Discontinued     3.375 g 12.5 mL/hr over 240 Minutes Intravenous Every 8 hours 09/20/18 0935 09/21/18 0233   09/20/18 1000  piperacillin-tazobactam (ZOSYN) IVPB 3.375 g  Status:  Discontinued     3.375 g 100 mL/hr over 30 Minutes Intravenous  Once 09/20/18 0928 09/20/18 0934   09/20/18 1000  vancomycin (VANCOCIN) 1,250 mg in sodium chloride 0.9 % 250 mL IVPB  Status:  Discontinued     1,250 mg 166.7 mL/hr over 90 Minutes Intravenous  Once 09/20/18 0928 09/20/18 0934   09/20/18 1000  vancomycin (VANCOCIN) 500 mg in sodium chloride 0.9 % 100 mL IVPB     500 mg 100 mL/hr over 60 Minutes Intravenous  Once 09/20/18 0937 09/20/18 1407   09/20/18 0715  vancomycin (VANCOCIN) IVPB 1000 mg/200 mL premix     1,000 mg 200 mL/hr over 60 Minutes Intravenous  Once 09/20/18 0706 09/20/18 0906   09/20/18 0715  piperacillin-tazobactam (ZOSYN) IVPB 3.375 g     3.375 g 100 mL/hr over 30 Minutes Intravenous  Once 09/20/18 0706 09/20/18 1008         Subjective: Patient seen and examined at bedside.  Very poor historian.  Had complaints of overnight vomiting.  No overnight fever reported no chest pains.  Still adamant that he wants to go home.  Objective: Vitals:    09/30/18 1226 09/30/18 1745 09/30/18 2257 10/01/18 0503  BP: 128/74 (!) 146/61 140/73 (!) 141/71  Pulse: 80 66 67 72  Resp: 15 18 16 18   Temp: 97.7 F (36.5 C) 98 F (36.7 C) 98.5 F (36.9 C) 98.4 F (36.9 C)  TempSrc: Oral Oral Oral Oral  SpO2: 99% 100% 100% 99%  Weight:      Height:        Intake/Output Summary (Last 24 hours) at 10/01/2018 0750 Last data filed at 10/01/2018 0230 Gross per 24 hour  Intake 948 ml  Output 300 ml  Net 648 ml   Filed Weights   09/19/18 2011 09/20/18 1055  Weight: 61.2 kg 64.7 kg    Examination:  General exam: Looks chronically ill.  Looks older than stated age.  Very poor historian  Respiratory system: Bilateral decreased breath sounds at bases with some scattered crackles.   Cardiovascular system: S1 & S2 heard, rate controlled  gastrointestinal system: Abdomen is distended, soft and nontender. Normal bowel sounds heard. Extremities: No cyanosis; trace edema CNS: Awake, alert and almost oriented x3.  He states that this is made to 120.  Moving extremities.  No focal neurologic deficit Psych: Intermittently gets angry and upset Lymph: No cervical lymphadenopathy Skin: No petechiae or rashes Neck: Supple, no JVD   Data Reviewed: I have personally reviewed following labs and imaging studies  CBC: Recent Labs  Lab 09/25/18 0512 09/30/18 0443 10/01/18 0400  WBC 11.8* 14.5* 21.5*  NEUTROABS  --  12.3* 19.6*  HGB 7.6* 7.0* 7.5*  HCT 22.9* 22.3* 23.0*  MCV 94.6 100.9* 101.3*  PLT 61* 126* 160   Basic Metabolic Panel: Recent Labs  Lab 09/25/18 0512 09/29/18 0442 09/30/18 0443 10/01/18 0400  NA 144  --  138 142  K 3.4*  --  2.7* 3.3*  CL 117*  --  114* 112*  CO2 18*  --  18* 23  GLUCOSE 130*  --  102* 125*  BUN 27*  --  8 15  CREATININE 1.17 0.91 0.77 0.81  CALCIUM 7.6*  --  7.4* 7.9*  MG  --   --  1.6* 2.1   GFR: Estimated Creatinine Clearance: 79.1 mL/min (by C-G formula based on SCr of 0.81 mg/dL). Liver Function Tests:  Recent Labs  Lab 09/30/18 0443  AST 104*  ALT 38  ALKPHOS 117  BILITOT 0.8  PROT 4.9*  ALBUMIN 1.3*  No results for input(s): LIPASE, AMYLASE in the last 168 hours. Recent Labs  Lab 09/30/18 0443  AMMONIA 37*   Coagulation Profile: No results for input(s): INR, PROTIME in the last 168 hours. Cardiac Enzymes: Recent Labs  Lab 09/29/18 0442  CKTOTAL 493*   BNP (last 3 results) No results for input(s): PROBNP in the last 8760 hours. HbA1C: No results for input(s): HGBA1C in the last 72 hours. CBG: Recent Labs  Lab 09/29/18 2021 09/30/18 0639 09/30/18 1233 09/30/18 1636 09/30/18 2301  GLUCAP 107* 104* 102* 100* 129*   Lipid Profile: No results for input(s): CHOL, HDL, LDLCALC, TRIG, CHOLHDL, LDLDIRECT in the last 72 hours. Thyroid Function Tests: No results for input(s): TSH, T4TOTAL, FREET4, T3FREE, THYROIDAB in the last 72 hours. Anemia Panel: No results for input(s): VITAMINB12, FOLATE, FERRITIN, TIBC, IRON, RETICCTPCT in the last 72 hours. Sepsis Labs: No results for input(s): PROCALCITON, LATICACIDVEN in the last 168 hours.  Recent Results (from the past 240 hour(s))  Culture, blood (routine x 2)     Status: None   Collection Time: 09/21/18 12:23 PM  Result Value Ref Range Status   Specimen Description BLOOD LEFT ANTECUBITAL  Final   Special Requests   Final    BOTTLES DRAWN AEROBIC ONLY Blood Culture results may not be optimal due to an inadequate volume of blood received in culture bottles   Culture   Final    NO GROWTH 5 DAYS Performed at [facility] [address], [place], [place] [address]    Report Status 09/26/2018 FINAL  Final  Culture, blood (routine x 2)     Status: None   Collection Time: 09/21/18  3:45 PM  Result Value Ref Range Status   Specimen Description BLOOD RIGHT HAND  Final   Special Requests   Final    BOTTLES DRAWN AEROBIC ONLY Blood Culture results may not be optimal due to an inadequate volume of blood received in  culture bottles   Culture   Final    NO GROWTH 5 DAYS Performed at [facility], [place] [address], [place], [place] [address]    Report Status 09/26/2018 FINAL  Final         Radiology Studies: Dg Abd Portable 1v  Result Date: 10/01/2018 CLINICAL DATA:  Persistent vomiting EXAM: PORTABLE ABDOMEN - 1 VIEW COMPARISON:  06/17/2017 abdominal CT FINDINGS: Moderate gaseous distention of the stomach. Overall nonobstructive bowel gas pattern. No concerning mass effect or calcification. Elevated right diaphragm also seen on prior. IMPRESSION: Moderate gaseous distention of the stomach. Electronically Signed   By: Monte Fantasia M.D.   On: 10/01/2018 04:38        Scheduled Meds: . amoxicillin-clavulanate  1 tablet Oral Q12H  . cloNIDine  0.2 mg Transdermal Weekly  . [START ON 10/05/2018] doxycycline  100 mg Oral Q12H  . feeding supplement  1 Container Oral BID BM  . feeding supplement (ENSURE ENLIVE)  237 mL Oral BID PC  . feeding supplement (PRO-STAT SUGAR FREE 64)  30 mL Oral TID WC  . folic acid  1 mg Oral Daily  . furosemide  20 mg Oral BID  . insulin aspart  0-9 Units Subcutaneous TID WC  . lactulose  30 g Oral TID  . multivitamin with minerals  1 tablet Oral Daily  . nadolol  20 mg Oral Daily  . saccharomyces boulardii  250 mg Oral BID  . sodium chloride flush  10-40 mL Intracatheter Q12H  . thiamine  100 mg Oral Daily   Continuous Infusions: . DAPTOmycin (CUBICIN)  IV 500 mg (09/30/18 2048)     LOS: 11 days        Aline August, MD [facility] 10/01/2018, 7:50 AM

## 2018-10-01 NOTE — Evaluation (Signed)
Occupational Therapy Evaluation Patient Details Name: Mark [name] MRN: [id number] DOB: 06-16-1955 Today's Date: 10/01/2018    History of Present Illness 62 year old male with history of alcoholic liver cirrhosis, pancytopenia, hypertension, coronary artery disease, polysubstance abuse (alcohol, heroin, marijuana), CKD stage III and MSSA bacteremia in 04/2018 presented with worsening lower back pain.  COVID-19 rapid testing was negative on admission.  MRI of the lumbar spine revealed enhancement throughout the epidural space of L3 through the sacrum concerning for possible phlegmon/abscess, L5-S1 marrow enhancement concerning for septic arthritis, and spinal stenosis L3-S1   Clinical Impression   Pt PTA: reports living with spouse and pt independent prior. Pt currently performing ADL transfers with Max+1-2 for safety as pt leans forward over RW for stability and pt refusing to stand upright due to back pain. Pt performing ADL functional mobility only taking a few steps guided from bed <-> recliner. Pt donning pants with maxA and requires minA for steadiness to pull pants to waist. Pt with mumbling speech at this time. Pt would benefit from continued OT skilled services for ADL, mobility and safety in SNF setting. Pt had agreed during OT session to SNF. OT to follow acutely for ADL, energy conservation and safety.      Follow Up Recommendations  SNF;Supervision/Assistance - 24 hour    Equipment Recommendations  None recommended by OT    Recommendations for Other Services       Precautions / Restrictions Precautions Precautions: Fall Restrictions Weight Bearing Restrictions: No      Mobility Bed Mobility Overal bed mobility: Needs Assistance Bed Mobility: Supine to Sit     Supine to sit: Mod assist     General bed mobility comments: Assist for BLE movement and trunk elevation with OTRs hand  Transfers Overall transfer level: Needs assistance Equipment used: Rolling  walker (2 wheeled) Transfers: Sit to/from Stand Sit to Stand: Max assist         General transfer comment: Pt with very flexed posture and requiring max A for lift assist and steadying to stand from recliner. Pt only able to tolerate a few seconds before having to sit down.     Balance Overall balance assessment: Needs assistance Sitting-balance support: No upper extremity supported;Feet supported Sitting balance-[name]: Fair     Standing balance support: Bilateral upper extremity supported Standing balance-[name]: Poor Standing balance comment: Reliant on UE and external support                            ADL either performed or assessed with clinical judgement   ADL Overall ADL's : Needs assistance/impaired Eating/Feeding: Modified independent;Sitting   Grooming: Supervision/safety;Sitting;Wash/dry hands;Wash/dry face   Upper Body Bathing: Set up;Sitting   Lower Body Bathing: Moderate assistance;Sitting/lateral leans;Sit to/from stand   Upper Body Dressing : Minimal assistance;Sitting   Lower Body Dressing: Moderate assistance;Sitting/lateral leans;Sit to/from stand   Toilet Transfer: Maximal assistance;Stand-pivot;BSC   Toileting- Clothing Manipulation and Hygiene: Maximal assistance;Sitting/lateral lean;Sit to/from stand;+2 for physical assistance       Functional mobility during ADLs: Maximal assistance;+2 for physical assistance;Rolling walker General ADL Comments: Pt set-upA for UB ADL and MaxA for LB ADL; pt leaning on elbows in standing with RW and very unsafe with taking any steps so deferred to transfer only.     Vision Baseline Vision/History: No visual deficits Vision Assessment?: No apparent visual deficits     Perception     Praxis      Pertinent  Vitals/Pain Pain Assessment: Faces Faces Pain Scale: Hurts even more Pain Location: back Pain Descriptors / Indicators: Aching;Burning Pain Intervention(s): Limited activity within  patient's tolerance     Hand Dominance     Extremity/Trunk Assessment Upper Extremity Assessment Upper Extremity Assessment: Generalized weakness   Lower Extremity Assessment Lower Extremity Assessment: Generalized weakness   Cervical / Trunk Assessment Cervical / Trunk Assessment: Other exceptions(leaning forward; elbows on RW)   Communication Communication Communication: No difficulties   Cognition Arousal/Alertness: Awake/alert Behavior During Therapy: WFL for tasks assessed/performed Overall Cognitive Status: Impaired/Different from baseline Area of Impairment: Safety/judgement                         Safety/Judgement: Decreased awareness of safety;Decreased awareness of deficits     General Comments: Pt with very poor awareness of deficits. When education provided about recommendations and current deficits, pt reporting "I'll be fine at home"   General Comments  Pt uncooperative and demanding at times. Pt did not try to start to don pants- waited for OTR to assist to pull to waist at  knees with the absence of any politeness.    Exercises Exercises: General Lower Extremity General Exercises - Lower Extremity Ankle Circles/Pumps: AROM;Both;10 reps Long Arc Quad: AROM;Both;5 reps;Seated Heel Slides: AAROM;Both;5 reps   Shoulder Instructions      Home Living Family/patient expects to be discharged to:: Private residence Living Arrangements: Spouse/significant other Available Help at Discharge: Family Type of Home: House Home Access: Stairs to enter Technical brewer of Steps: 2 Entrance Stairs-Rails: None Home Layout: One level     Bathroom Shower/Tub: Tub/shower unit         Home Equipment: Environmental consultant - 2 wheels;Cane - single point          Prior Functioning/Environment Level of Independence: Independent with assistive device(s)        Comments: uses a walking stick        OT Problem List: Decreased strength;Decreased activity  tolerance;Impaired balance (sitting and/or standing);Decreased safety awareness;Decreased cognition;Decreased coordination      OT Treatment/Interventions: Self-care/ADL training;Therapeutic exercise;Neuromuscular education;Energy conservation;Therapeutic activities;Patient/family education;Balance training    OT Goals(Current goals can be found in the care plan section) Acute Rehab OT Goals Patient Stated Goal: to go home OT Goal Formulation: With patient Time For Goal Achievement: 10/15/18 Potential to Achieve Goals: Good ADL Goals Pt Will Perform Grooming: with modified independence;standing Pt Will Perform Lower Body Dressing: with supervision;sit to/from stand Pt Will Transfer to Toilet: with min guard assist;ambulating;regular height toilet Pt Will Perform Toileting - Clothing Manipulation and hygiene: with min assist;sit to/from stand Additional ADL Goal #1: Pt will stand at sink x5 mins with minguardA for ADL tasks iwth set-up and fair balance.  OT Frequency: Min 2X/week   Barriers to D/C:            Co-evaluation              AM-PAC OT "6 Clicks" Daily Activity     Outcome Measure Help from another person eating meals?: None Help from another person taking care of personal grooming?: A Little Help from another person toileting, which includes using toliet, bedpan, or urinal?: A Lot Help from another person bathing (including washing, rinsing, drying)?: A Lot Help from another person to put on and taking off regular upper body clothing?: A Little Help from another person to put on and taking off regular lower body clothing?: A Lot 6 Click Score: 16   End  of Session Equipment Utilized During Treatment: Gait belt;Rolling walker Nurse Communication: Mobility status  Activity Tolerance: Treatment limited secondary to agitation Patient left: in chair;with call bell/phone within reach;with chair alarm set  OT Visit Diagnosis: Unsteadiness on feet (R26.81);Muscle  weakness (generalized) (M62.81)                Time: 8022-1798 OT Time Calculation (min): 32 min Charges:  OT General Charges $OT Visit: 1 Visit OT Evaluation $OT Eval Moderate Complexity: 1 Mod OT Treatments $Self Care/Home Management : 8-22 mins  Ebony Hail Harold Hedge) Marsa Aris OTR/L Acute Rehabilitation Services Pager: [phone] Office: [place] 10/01/2018, 3:10 PM

## 2018-10-01 NOTE — Progress Notes (Signed)
Occupational Therapy Treatment Patient Details Name: Mark [name] MRN: [id number] DOB: 1955/11/14 Today's Date: 10/01/2018    History of present illness 63 year old male with history of alcoholic liver cirrhosis, pancytopenia, hypertension, coronary artery disease, polysubstance abuse (alcohol, heroin, marijuana), CKD stage III and MSSA bacteremia in 04/2018 presented with worsening lower back pain.  COVID-19 rapid testing was negative on admission.  MRI of the lumbar spine revealed enhancement throughout the epidural space of L3 through the sacrum concerning for possible phlegmon/abscess, L5-S1 marrow enhancement concerning for septic arthritis, and spinal stenosis L3-S1   OT comments  Pt found in recliner on edge of seat and sitting mostly on the legs of the recliner attempting to stand. Chair alarm had fallen off or was thrown off (pt denied touching it). Pt required cues for proper hand placement for sit to stands with maxA to scoot to back of chair. Pt attempting to take a few steps away from chair, but remained unsteady leaning over RW unsafely. Pt guided back to recliner with BLE elevated. Pt dressed in own clothes and appears ready for d/c, but is very unsafe and maxA would be difficult for family to manage safely. OT to focus on energy conservation and ADL tasks with safety acutely and in SNF setting.   Follow Up Recommendations  SNF;Supervision/Assistance - 24 hour    Equipment Recommendations  None recommended by OT    Recommendations for Other Services      Precautions / Restrictions Precautions Precautions: Fall Restrictions Weight Bearing Restrictions: No       Mobility Bed Mobility Overal bed mobility: Needs Assistance Bed Mobility: Supine to Sit     Supine to sit: Mod assist     General bed mobility comments: Assist for BLE movement and trunk elevation with OTRs hand  Transfers Overall transfer level: Needs assistance Equipment used: Rolling walker (2  wheeled) Transfers: Sit to/from Stand Sit to Stand: Max assist         General transfer comment: Pt with very flexed posture and requiring max A for lift assist and steadying to stand from recliner. Pt only able to tolerate a few seconds before having to sit down.     Balance Overall balance assessment: Needs assistance Sitting-balance support: No upper extremity supported;Feet supported Sitting balance-[name]: Fair     Standing balance support: Bilateral upper extremity supported Standing balance-[name]: Poor Standing balance comment: Reliant on UE and external support                            ADL either performed or assessed with clinical judgement   ADL Overall ADL's : Needs assistance/impaired Eating/Feeding: Modified independent;Sitting   Grooming: Supervision/safety;Sitting;Wash/dry hands;Wash/dry face   Upper Body Bathing: Set up;Sitting   Lower Body Bathing: Moderate assistance;Sitting/lateral leans;Sit to/from stand   Upper Body Dressing : Minimal assistance;Sitting   Lower Body Dressing: Moderate assistance;Sitting/lateral leans;Sit to/from stand   Toilet Transfer: Maximal assistance;Stand-pivot;BSC   Toileting- Clothing Manipulation and Hygiene: Maximal assistance;Sitting/lateral lean;Sit to/from stand;+2 for physical assistance       Functional mobility during ADLs: Maximal assistance;+2 for physical assistance;Rolling walker General ADL Comments: Pt set-upA for UB ADL and MaxA for LB ADL; pt leaning on elbows in standing with RW and very unsafe with taking any steps so deferred to transfer only.     Vision Baseline Vision/History: No visual deficits Vision Assessment?: No apparent visual deficits   Perception     Praxis  Cognition Arousal/Alertness: Awake/alert Behavior During Therapy: Impulsive;Flat affect Overall Cognitive Status: Impaired/Different from baseline Area of Impairment: Safety/judgement                          Safety/Judgement: Decreased awareness of safety;Decreased awareness of deficits     General Comments: Pt found in recliner on edge of seat and sitting mostly on the legs of the recliner attempting to stand. Chair alarm had fallen off or was thrown off (pt denied touching it). Pt required cues for proper hand placement for sit to stands with maxA to scoot to back of chair. Pt attempting to take a few steps away from chair, but remained unsteady leaning over RW unsafely. Pt guided back to recliner with BLE elevated. Pt dressed up and appears ready for d/c, but is very unsafe and maxA would be difficult for family to manage safely.        Exercises Exercises: General Lower Extremity General Exercises - Lower Extremity Ankle Circles/Pumps: AROM;Both;10 reps Long Arc Quad: AROM;Both;5 reps;Seated Heel Slides: AAROM;Both;5 reps   Shoulder Instructions       General Comments Pt hard to convince to sit back in recliner stating "I want to walk" eventhough OTR and pt attempted, but pt unsteady and refuses to stand upright.    Pertinent Vitals/ Pain       Pain Assessment: Faces Faces Pain Scale: Hurts little more Pain Location: back Pain Descriptors / Indicators: Aching;Burning Pain Intervention(s): Limited activity within patient's tolerance  Home Living Family/patient expects to be discharged to:: Private residence Living Arrangements: Spouse/significant other Available Help at Discharge: Family Type of Home: House Home Access: Stairs to enter Technical brewer of Steps: 2 Entrance Stairs-Rails: None Home Layout: One level     Bathroom Shower/Tub: Tub/shower unit         Home Equipment: Environmental consultant - 2 wheels;Cane - single point          Prior Functioning/Environment Level of Independence: Independent with assistive device(s)        Comments: uses a walking stick   Frequency  Min 2X/week        Progress Toward Goals  OT Goals(current goals can now  be found in the care plan section)     Acute Rehab OT Goals Patient Stated Goal: to go home OT Goal Formulation: With patient Time For Goal Achievement: 10/15/18 Potential to Achieve Goals: Good ADL Goals Pt Will Perform Grooming: with modified independence;standing Pt Will Perform Lower Body Dressing: with supervision;sit to/from stand Pt Will Transfer to Toilet: with min guard assist;ambulating;regular height toilet Pt Will Perform Toileting - Clothing Manipulation and hygiene: with min assist;sit to/from stand Additional ADL Goal #1: Pt will stand at sink x5 mins with minguardA for ADL tasks iwth set-up and fair balance.  Plan      Co-evaluation                 AM-PAC OT "6 Clicks" Daily Activity     Outcome Measure   Help from another person eating meals?: None Help from another person taking care of personal grooming?: A Little Help from another person toileting, which includes using toliet, bedpan, or urinal?: A Lot Help from another person bathing (including washing, rinsing, drying)?: A Lot Help from another person to put on and taking off regular upper body clothing?: A Little Help from another person to put on and taking off regular lower body clothing?: A Lot 6 Click Score: 16  End of Session Equipment Utilized During Treatment: Gait belt;Rolling walker  OT Visit Diagnosis: Unsteadiness on feet (R26.81);Muscle weakness (generalized) (M62.81)   Activity Tolerance Treatment limited secondary to agitation   Patient Left in chair;with call bell/phone within reach;with chair alarm set   Nurse Communication Mobility status        Time: 1220-1230 OT Time Calculation (min): 10 min  Charges: OT General Charges $OT Visit: 1 Visit OT Evaluation $OT Eval Moderate Complexity: 1 Mod OT Treatments $Self Care/Home Management : 8-22 mins $Therapeutic Activity: 8-22 mins  Ebony Hail Harold Hedge) Marsa Aris OTR/L Acute Rehabilitation Services Pager:  [phone] Office: [place] 10/01/2018, 3:20 PM

## 2018-10-01 NOTE — Progress Notes (Signed)
Inpatient Rehabilitation-Admissions Coordinator   [facility] met with pt at the bedside as follow up from PM&R consult. At this time pt is adamantly refusing CIR. He states he wants to go home and feels he will have assistance at home. Due to adamant refusal, AC will sign off.   Please call if questions.   Jhonnie Garner, OTR/L  Rehab Admissions Coordinator  [phone] 10/01/2018 12:08 PM

## 2018-10-01 NOTE — Progress Notes (Signed)
Physical Therapy Treatment Patient Details Name: Mark [name] MRN: [id number] DOB: 05/30/1955 Today's Date: 10/01/2018    History of Present Illness 63 year old male with history of alcoholic liver cirrhosis, pancytopenia, hypertension, coronary artery disease, polysubstance abuse (alcohol, heroin, marijuana), CKD stage III and MSSA bacteremia in 04/2018 presented with worsening lower back pain.  COVID-19 rapid testing was negative on admission.  MRI of the lumbar spine revealed enhancement throughout the epidural space of L3 through the sacrum concerning for possible phlegmon/abscess, L5-S1 marrow enhancement concerning for septic arthritis, and spinal stenosis L3-S1    PT Comments    Pt with slow progression towards goals. Required max A to stand, however, pt only able to tolerate a few seconds in standing prior to having to sit down. Pt performed seated HEP. Feel pt has very poor awareness of current deficits and is insistent on going home. Pt with very high risk for falls and feel he would benefit from SNF level therapies. Will continue to follow acutely to maximize functional mobility independence and safety.     Follow Up Recommendations  SNF;Supervision/Assistance - 24 hour(will likely refuse)     Equipment Recommendations  Wheelchair cushion (measurements PT);Wheelchair (measurements PT);Hospital bed    Recommendations for Other Services       Precautions / Restrictions Precautions Precautions: Fall Restrictions Weight Bearing Restrictions: No    Mobility  Bed Mobility               General bed mobility comments: Pt in chair upon entry.   Transfers Overall transfer level: Needs assistance Equipment used: Rolling walker (2 wheeled) Transfers: Sit to/from Stand Sit to Stand: Max assist         General transfer comment: Pt with very flexed posture and requiring max A for lift assist and steadying to stand from recliner. Pt only able to tolerate a few  seconds before having to sit down.   Ambulation/Gait             General Gait Details: unab;e   Marine scientist Rankin (Stroke Patients Only)       Balance Overall balance assessment: Needs assistance Sitting-balance support: No upper extremity supported;Feet supported Sitting balance-[name]: Fair     Standing balance support: Bilateral upper extremity supported Standing balance-[name]: Poor Standing balance comment: Reliant on UE and external support                             Cognition Arousal/Alertness: Awake/alert Behavior During Therapy: WFL for tasks assessed/performed Overall Cognitive Status: Impaired/Different from baseline Area of Impairment: Safety/judgement                         Safety/Judgement: Decreased awareness of safety;Decreased awareness of deficits     General Comments: Pt with very poor awareness of deficits. When education provided about recommendations and current deficits, pt reporting "I'll be fine at home"      Exercises General Exercises - Lower Extremity Ankle Circles/Pumps: AROM;Both;10 reps Long Arc Quad: AROM;Both;5 reps;Seated Heel Slides: AAROM;Both;5 reps    General Comments        Pertinent Vitals/Pain Pain Assessment: Faces Faces Pain Scale: Hurts even more Pain Location: back Pain Descriptors / Indicators: Aching;Burning Pain Intervention(s): Limited activity within patient's tolerance;Monitored during session;Repositioned    Home Living  Prior Function            PT Goals (current goals can now be found in the care plan section) Acute Rehab PT Goals Patient Stated Goal: less pain PT Goal Formulation: With patient Time For Goal Achievement: 10/14/18 Potential to Achieve Goals: Good Progress towards PT goals: Progressing toward goals    Frequency    Min 3X/week      PT Plan Discharge plan needs  to be updated    Co-evaluation              AM-PAC PT "6 Clicks" Mobility   Outcome Measure  Help needed turning from your back to your side while in a flat bed without using bedrails?: A Lot Help needed moving from lying on your back to sitting on the side of a flat bed without using bedrails?: A Lot Help needed moving to and from a bed to a chair (including a wheelchair)?: Total Help needed standing up from a chair using your arms (e.g., wheelchair or bedside chair)?: Total Help needed to walk in hospital room?: Total Help needed climbing 3-5 steps with a railing? : Total 6 Click Score: 8    End of Session Equipment Utilized During Treatment: Gait belt Activity Tolerance: Patient tolerated treatment well Patient left: in chair;with call bell/phone within reach;with chair alarm set;with nursing/sitter in room Nurse Communication: Mobility status PT Visit Diagnosis: Unsteadiness on feet (R26.81)     Time: 6950-7225 PT Time Calculation (min) (ACUTE ONLY): 15 min  Charges:  $Therapeutic Activity: 8-22 mins                     Leighton Ruff, PT, DPT  Acute Rehabilitation Services  Pager: [phone] Office: [phone]    Rudean Hitt 10/01/2018, 2:02 PM

## 2018-10-02 LAB — COMPREHENSIVE METABOLIC PANEL
ALT: 53 U/L — ABNORMAL HIGH (ref 0–44)
AST: 129 U/L — ABNORMAL HIGH (ref 15–41)
Albumin: 1.5 g/dL — ABNORMAL LOW (ref 3.5–5.0)
Alkaline Phosphatase: 108 U/L (ref 38–126)
Anion gap: 5 (ref 5–15)
BUN: 18 mg/dL (ref 8–23)
CO2: 24 mmol/L (ref 22–32)
Calcium: 8 mg/dL — ABNORMAL LOW (ref 8.9–10.3)
Chloride: 115 mmol/L — ABNORMAL HIGH (ref 98–111)
Creatinine, Ser: 0.81 mg/dL (ref 0.61–1.24)
GFR calc Af Amer: >60 mL/min (ref >60)
GFR calc non Af Amer: >60 mL/min (ref >60)
Glucose, Bld: 102 mg/dL — ABNORMAL HIGH (ref 70–99)
Potassium: 3.3 mmol/L — ABNORMAL LOW (ref 3.5–5.1)
Sodium: 144 mmol/L (ref 135–145)
Total Bilirubin: 0.9 mg/dL (ref 0.3–1.2)
Total Protein: 5.5 g/dL — ABNORMAL LOW (ref 6.5–8.1)

## 2018-10-02 LAB — CBC WITH DIFFERENTIAL/PLATELET
Abs Immature Granulocytes: 0.09 10*3/uL — ABNORMAL HIGH (ref 0.00–0.07)
Basophils Absolute: 0 10*3/uL (ref 0.0–0.1)
Basophils Relative: 0 %
Eosinophils Absolute: 0.4 10*3/uL (ref 0.0–0.5)
Eosinophils Relative: 2 %
HCT: 19.7 % — ABNORMAL LOW (ref 39.0–52.0)
Hemoglobin: 6.3 g/dL — CL (ref 13.0–17.0)
Immature Granulocytes: 1 %
Lymphocytes Relative: 6 %
Lymphs Abs: 0.9 10*3/uL (ref 0.7–4.0)
MCH: 32.6 pg (ref 26.0–34.0)
MCHC: 32 g/dL (ref 30.0–36.0)
MCV: 102.1 fL — ABNORMAL HIGH (ref 80.0–100.0)
Monocytes Absolute: 1.3 10*3/uL — ABNORMAL HIGH (ref 0.1–1.0)
Monocytes Relative: 8 %
Neutro Abs: 13.4 10*3/uL — ABNORMAL HIGH (ref 1.7–7.7)
Neutrophils Relative %: 83 %
Platelets: 160 10*3/uL (ref 150–400)
RBC: 1.93 MIL/uL — ABNORMAL LOW (ref 4.22–5.81)
RDW: 20.1 % — ABNORMAL HIGH (ref 11.5–15.5)
WBC: 16.2 10*3/uL — ABNORMAL HIGH (ref 4.0–10.5)
nRBC: 0 % (ref 0.0–0.2)

## 2018-10-02 LAB — PREPARE RBC (CROSSMATCH)

## 2018-10-02 LAB — GLUCOSE, CAPILLARY
Glucose-Capillary: 100 mg/dL — ABNORMAL HIGH (ref 70–99)
Glucose-Capillary: 100 mg/dL — ABNORMAL HIGH (ref 70–99)
Glucose-Capillary: 107 mg/dL — ABNORMAL HIGH (ref 70–99)
Glucose-Capillary: 87 mg/dL (ref 70–99)
Glucose-Capillary: 85 mg/dL (ref 70–99)
Glucose-Capillary: 85 mg/dL (ref 70–99)

## 2018-10-02 LAB — MAGNESIUM: Magnesium: 1.8 mg/dL (ref 1.7–2.4)

## 2018-10-02 LAB — CK: Total CK: 500 U/L — ABNORMAL HIGH (ref 49–397)

## 2018-10-02 LAB — AMMONIA: Ammonia: 26 umol/L (ref 9–35)

## 2018-10-02 MED ORDER — POTASSIUM CHLORIDE CRYS ER 20 MEQ PO TBCR
40.0000 meq | EXTENDED_RELEASE_TABLET | Freq: Once | ORAL | Status: AC
  Administered 2018-10-02: 40 meq via ORAL
  Filled 2018-10-02: qty 2

## 2018-10-02 MED ORDER — SPIRONOLACTONE 25 MG PO TABS
50.0000 mg | ORAL_TABLET | Freq: Two times a day (BID) | ORAL | Status: DC
  Administered 2018-10-02 – 2018-10-04 (×6): 50 mg via ORAL
  Filled 2018-10-02 (×8): qty 2

## 2018-10-02 MED ORDER — SODIUM CHLORIDE 0.9% IV SOLUTION
Freq: Once | INTRAVENOUS | Status: AC
  Administered 2018-10-02: 09:00:00 via INTRAVENOUS

## 2018-10-02 NOTE — Progress Notes (Signed)
Patient ID: Mark [name], male   DOB: [birth year], 63 y.o.   MRN: [id number]  PROGRESS NOTE    Mark [name]  ZSW:[id number] DOB: 1955-06-17 DOA: 09/19/2018 PCP: [name], MD   Brief Narrative:  63 year old male with history of alcoholic liver cirrhosis, pancytopenia, hypertension, coronary artery disease, polysubstance abuse (alcohol, heroin, marijuana), CKD stage III and MSSA bacteremia in 04/2018 presented with worsening lower back pain.  COVID-19 rapid testing was negative on admission.  MRI of the lumbar spine revealed enhancement throughout the epidural space of L3 through the sacrum concerning for possible phlegmon/abscess, L5-S1 marrow enhancement concerning for septic arthritis, and spinal stenosis L3-S1. He was started on broad-spectrum antibiotics.  Neurosurgery was consulted.  IR was also consulted.  IR thought that there is no abscess/collection large enough to aspirate or drain.  Neurosurgery subsequently recommended medical management with antibiotics for now as patient is very high risk for surgical intervention.  He was found to have MRSA bacteremia and ID was consulted.  Antibiotics was switched to daptomycin.  Hospitalization has been complicated with waxing and waning mental status.  Palliative care was consulted and CODE STATUS has been changed to DNR.  He was also started on Unasyn for aspiration pneumonia.  ID recommends intravenous daptomycin for 2 weeks and 4 weeks of doxycycline.  ID has signed off.  Assessment & Plan:   Principal Problem:   MRSA bacteremia Active Problems:   Cirrhosis, alcoholic ([place])   Polysubstance abuse ([place])   Hyponatremia   Pancytopenia ([name])   Hepatic encephalopathy ([name])   Alcohol abuse   Septic arthritis ([name])   Anemia of chronic disease   Hypoalbuminemia   Hypoglycemia without diagnosis of diabetes mellitus   Epidural abscess   Abscess in epidural space of lumbar spine   Aspiration pneumonia ([name])   AKI (acute kidney  injury) ([place])   Bacteremia   Spinal stenosis of lumbar region without neurogenic claudication  Septic arthritis/epidural abscess of lumbar spine -MRI of the lumbar spine revealed enhancement throughout the epidural space of L3 through the sacrum concerning for possible phlegmon/abscess, L5-S1 marrow enhancement concerning for septic arthritis, and spinal stenosis L3-S1 -Patient was initially started on broad-spectrum antibiotics vancomycin and Zosyn.  Currently on daptomycin as per ID. -Neurosurgery had initially recommended IR consultation for probable drainage/biopsy -As per IR, the abscess/collection was not large enough to aspirate or drain.  IR has notified Dr. Zada Finders. -Neurosurgery found patient to be high risk for any surgical intervention and is recommended conservative management with IV antibiotics only. -No evidence of loss of bowel or bladder control for now -Continue pain management and supportive care.  Patient complaining of increasing pain.  Increase oxycodone dose.  Might need long-acting opiate.  Monitor mental status. -Currently afebrile.   -Given issues with IV access, patient received 2 doses of Zyvox on 09/26/2018. -PICC line was placed on 09/27/2018 by IR.  Now back on daptomycin.  Monitor CK intermittently.  ID recommends 2 weeks of daptomycin, last date is 10/04/2018; followed by 4 weeks of doxycycline.  MRSA bacteremia -Repeat blood cultures have been negative since 09/21/2018 -2D echo: Preserved ejection fraction, no wall motion normalities, no vegetations appreciated on any valve.  Mild regurgitation and he tricuspid valve was appreciated by color flow Doppler -ID has signed off.  Antibiotic plan as above.  Aspiration pneumonia -Completed treatment with Unasyn and Augmentin.  Chest x-ray on 10/01/2018 showed mild bibasilar atelectasis -Diet as per SLP recommendations  Leukocytosis -From above.  Improving, 16.2  today.  Blood culture from 10/01/2018 is negative so  far.  Acute kidney injury with metabolic acidosis  -Baseline creatinine of 0.95 on 08/22/2018.  Patient on Lasix and spironolactone at home which have remained on hold -Presented with creatinine of 1.72 which worsened up to 2.4 -Resolved -Renal ultrasound showed no obstructive uropathy or hydronephrosis -Strict input and output.  Daily weights.  Monitor creatinine  Anemia of chronic disease -Hemoglobin 6.3 today.  No signs of bleeding.  Transfuse 1 unit of packed red cells.  Hypokalemia -Replace.  Repeat a.m. labs  Hypomagnesemia--improved.  Alcoholic cirrhosis with hepatic encephalopathy  Chronic thrombocytopenia -continue lactulose.  Patient intermittently refuses lactulose. -Continue nadolol. -Patient has had waxing and waning mental status during the hospitalization along with aspiration pneumonia as well.  We need to be careful with use of opioids and sedatives. -Lasix was restarted on 09/30/2018.  Monitor.  Will restart spironolactone as well.  Polysubstance abuse -Patient admits to alcohol and recent heroin use prior to arrival -Continue multivitamin, thiamine and folate.  Hypoalbuminemia -Oral intake still very poor. -Follow nutrition recommendations  Generalized deconditioning -Overall prognosis is very poor.  Patient is noncompliant and still actively using drugs. -His mental status has been waxing and waning as well during the hospitalization.  Palliative care has been intermittently following the patient.  Patient has been made DNR as per palliative care discussions with family: Looking to maintain quality and avoid heroic invasive measures. -If condition worsens, consider hospice/comfort measures. -PT recommends CIR placement.  CIR consulted.  Patient refused CIR placement.  Social worker consulted for nursing home placement.  I doubt that he would agree for that. -Over the last few days, patient has persistently requested to go home with PICC line.  I have also  persistently stated that he will not be able to go home with a PICC line given history of drug abuse.  If he wants to go home, he will have to leave [place].  -I spoke to daughter Mark [name] on phone on 10/01/2018 in detail and explained to her that his condition is progressively getting worse and he is not ready to be discharged home.  If he insists on being discharged, he will need to go home with hospice.  Daughter agrees and is concerned about his father and will try and talk to his father and mother as well regarding him.  Dysphagia -Diet as per SLP recommendations.   DVT prophylaxis: SCDs Code Status: DNR Family Communication: Spoke to daughter Mark [name] on phone on 10/01/2018 Disposition Plan: Depends on clinical outcome.  Will need to stay inpatient till 10/04/2018 to finish 14 days of IV daptomycin.  Consultants: Neurosurgery/ID/IR/palliative care  Procedures:  2D echo: IMPRESSIONS 1. The left ventricle has normal systolic function with an ejection fraction of 60-65%. The cavity size was normal. There is mildly increased left ventricular wall thickness. Left ventricular diastolic parameters were normal. 2. The right ventricle has normal systolic function. The cavity was normal. There is no increase in right ventricular wall thickness. 3. Left atrial size was severely dilated. 4. No evidence of mitral valve stenosis. 5. Mild thickening of the aortic valve. Mild calcification of the aortic valve. No stenosis of the aortic valve  Antimicrobials:  Anti-infectives (From admission, onward)   Start     Dose/Rate Route Frequency Ordered Stop   10/05/18 1000  doxycycline (VIBRA-TABS) tablet 100 mg     100 mg Oral Every 12 hours 09/30/18 1414 11/02/18 0959   09/30/18 1000  amoxicillin-clavulanate (AUGMENTIN)  875-125 MG per tablet 1 tablet  Status:  Discontinued     1 tablet Oral Every 12 hours 09/29/18 1548 09/29/18 1549   09/29/18 2200  amoxicillin-clavulanate (AUGMENTIN) 875-125 MG  per tablet 1 tablet     1 tablet Oral Every 12 hours 09/29/18 1549 10/01/18 0959   09/27/18 2000  Ampicillin-Sulbactam (UNASYN) 3 g in sodium chloride 0.9 % 100 mL IVPB  Status:  Discontinued     3 g 200 mL/hr over 30 Minutes Intravenous Every 8 hours 09/26/18 2013 09/27/18 1237   09/27/18 2000  DAPTOmycin (CUBICIN) 500 mg in sodium chloride 0.9 % IVPB     500 mg 220 mL/hr over 30 Minutes Intravenous Daily 09/27/18 1617 10/04/18 2359   09/27/18 1245  amoxicillin-clavulanate (AUGMENTIN) 875-125 MG per tablet 1 tablet  Status:  Discontinued     1 tablet Oral Every 12 hours 09/27/18 1237 09/29/18 1548   09/27/18 1245  doxycycline (VIBRA-TABS) tablet 100 mg  Status:  Discontinued     100 mg Oral Every 12 hours 09/27/18 1237 09/27/18 1605   09/26/18 2200  amoxicillin-clavulanate (AUGMENTIN) 875-125 MG per tablet 1 tablet     1 tablet Oral Every 12 hours 09/26/18 2013 09/27/18 0939   09/26/18 2200  linezolid (ZYVOX) tablet 600 mg     600 mg Oral Every 12 hours 09/26/18 2013 09/27/18 0939   09/24/18 2000  DAPTOmycin (CUBICIN) 500 mg in sodium chloride 0.9 % IVPB  Status:  Discontinued     500 mg 220 mL/hr over 30 Minutes Intravenous Daily 09/23/18 2233 09/27/18 1455   09/24/18 1330  Ampicillin-Sulbactam (UNASYN) 3 g in sodium chloride 0.9 % 100 mL IVPB  Status:  Discontinued     3 g 200 mL/hr over 30 Minutes Intravenous Every 8 hours 09/24/18 1225 09/26/18 2013   09/24/18 0600  piperacillin-tazobactam (ZOSYN) IVPB 3.375 g  Status:  Discontinued     3.375 g 12.5 mL/hr over 240 Minutes Intravenous Every 8 hours 09/23/18 2233 09/24/18 1135   09/23/18 2300  piperacillin-tazobactam (ZOSYN) IVPB 3.375 g     3.375 g 100 mL/hr over 30 Minutes Intravenous  Once 09/23/18 2233 09/24/18 0318   09/21/18 1000  vancomycin (VANCOCIN) IVPB 750 mg/150 ml premix  Status:  Discontinued     750 mg 150 mL/hr over 60 Minutes Intravenous Every 24 hours 09/20/18 0949 09/21/18 0233   09/21/18 1000  DAPTOmycin  (CUBICIN) 500 mg in sodium chloride 0.9 % IVPB  Status:  Discontinued     500 mg 220 mL/hr over 30 Minutes Intravenous Every 48 hours 09/21/18 0903 09/23/18 2233   09/21/18 0400  ceFAZolin (ANCEF) IVPB 2g/100 mL premix  Status:  Discontinued     2 g 200 mL/hr over 30 Minutes Intravenous Every 8 hours 09/21/18 0233 09/21/18 0900   09/20/18 1800  piperacillin-tazobactam (ZOSYN) IVPB 3.375 g  Status:  Discontinued     3.375 g 12.5 mL/hr over 240 Minutes Intravenous Every 8 hours 09/20/18 0929 09/20/18 0935   09/20/18 1500  piperacillin-tazobactam (ZOSYN) IVPB 3.375 g  Status:  Discontinued     3.375 g 12.5 mL/hr over 240 Minutes Intravenous Every 8 hours 09/20/18 0935 09/21/18 0233   09/20/18 1000  piperacillin-tazobactam (ZOSYN) IVPB 3.375 g  Status:  Discontinued     3.375 g 100 mL/hr over 30 Minutes Intravenous  Once 09/20/18 0928 09/20/18 0934   09/20/18 1000  vancomycin (VANCOCIN) 1,250 mg in sodium chloride 0.9 % 250 mL IVPB  Status:  Discontinued  1,250 mg 166.7 mL/hr over 90 Minutes Intravenous  Once 09/20/18 0928 09/20/18 0934   09/20/18 1000  vancomycin (VANCOCIN) 500 mg in sodium chloride 0.9 % 100 mL IVPB     500 mg 100 mL/hr over 60 Minutes Intravenous  Once 09/20/18 0937 09/20/18 1407   09/20/18 0715  vancomycin (VANCOCIN) IVPB 1000 mg/200 mL premix     1,000 mg 200 mL/hr over 60 Minutes Intravenous  Once 09/20/18 0706 09/20/18 0906   09/20/18 0715  piperacillin-tazobactam (ZOSYN) IVPB 3.375 g     3.375 g 100 mL/hr over 30 Minutes Intravenous  Once 09/20/18 0706 09/20/18 1008      Subjective: Patient seen and examined at bedside.  Very poor historian.  He is sleepy, wakes up only very slightly, does not answer much questions.  No overnight fever or vomiting reported by nursing staff.  He still is intermittently refusing lactulose.  Objective: Vitals:   10/01/18 2117 10/02/18 0421 10/02/18 0859 10/02/18 0924  BP: 139/70 (!) 148/72 126/60 126/86  Pulse: 74 78 91 99   Resp:   18 16  Temp: (!) 97.4 F (36.3 C) 98.4 F (36.9 C) 98.3 F (36.8 C) 98.2 F (36.8 C)  TempSrc:   Oral Oral  SpO2: 100% 98% 99% 98%  Weight:      Height:        Intake/Output Summary (Last 24 hours) at 10/02/2018 1045 Last data filed at 10/01/2018 1700 Gross per 24 hour  Intake 240 ml  Output --  Net 240 ml   Filed Weights   09/19/18 2011 09/20/18 1055  Weight: 61.2 kg 64.7 kg    Examination:  General exam: Looks chronically ill.  Looks older than stated age.  Looks up slightly on calling his name.  Hardly answers any questions.   Respiratory system: Bilateral decreased breath sounds at bases with some scattered crackles mostly in the bases.  No wheezing Cardiovascular system: Rate controlled, S1-S2 heard  gastrointestinal system: Abdomen is distended, soft and nontender. Normal bowel sounds heard. Extremities: Trace edema, no cyanosis CNS: Sleepy, moving extremities.  Hardly answers any questions.   Psych: Could not be assessed because of patient being very sleepy Lymph: No cervical lymphadenopathy Skin: No petechiae or ulcers or rashes  neck: Supple, no JVD   Data Reviewed: I have personally reviewed following labs and imaging studies  CBC: Recent Labs  Lab 09/30/18 0443 10/01/18 0400 10/02/18 0454  WBC 14.5* 21.5* 16.2*  NEUTROABS 12.3* 19.6* 13.4*  HGB 7.0* 7.5* 6.3*  HCT 22.3* 23.0* 19.7*  MCV 100.9* 101.3* 102.1*  PLT 126* 194 759   Basic Metabolic Panel: Recent Labs  Lab 09/29/18 0442 09/30/18 0443 10/01/18 0400 10/02/18 0454  NA  --  138 142 144  K  --  2.7* 3.3* 3.3*  CL  --  114* 112* 115*  CO2  --  18* 23 24  GLUCOSE  --  102* 125* 102*  BUN  --  8 15 18   CREATININE 0.91 0.77 0.81 0.81  CALCIUM  --  7.4* 7.9* 8.0*  MG  --  1.6* 2.1 1.8   GFR: Estimated Creatinine Clearance: 79.1 mL/min (by C-G formula based on SCr of 0.81 mg/dL). Liver Function Tests: Recent Labs  Lab 09/30/18 0443 10/02/18 0454  AST 104* 129*  ALT 38 53*   ALKPHOS 117 108  BILITOT 0.8 0.9  PROT 4.9* 5.5*  ALBUMIN 1.3* 1.5*   No results for input(s): LIPASE, AMYLASE in the last 168 hours. Recent Labs  Lab 09/30/18 0443 10/02/18 0454  AMMONIA 37* 26   Coagulation Profile: No results for input(s): INR, PROTIME in the last 168 hours. Cardiac Enzymes: Recent Labs  Lab 09/29/18 0442 10/02/18 0454  CKTOTAL 493* 500*   BNP (last 3 results) No results for input(s): PROBNP in the last 8760 hours. HbA1C: No results for input(s): HGBA1C in the last 72 hours. CBG: Recent Labs  Lab 10/01/18 1716 10/01/18 2113 10/02/18 0114 10/02/18 0418 10/02/18 0818  GLUCAP 100* 102* 100* 100* 107*   Lipid Profile: No results for input(s): CHOL, HDL, LDLCALC, TRIG, CHOLHDL, LDLDIRECT in the last 72 hours. Thyroid Function Tests: No results for input(s): TSH, T4TOTAL, FREET4, T3FREE, THYROIDAB in the last 72 hours. Anemia Panel: No results for input(s): VITAMINB12, FOLATE, FERRITIN, TIBC, IRON, RETICCTPCT in the last 72 hours. Sepsis Labs: No results for input(s): PROCALCITON, LATICACIDVEN in the last 168 hours.  Recent Results (from the past 240 hour(s))  Culture, blood (routine x 2)     Status: None (Preliminary result)   Collection Time: 10/01/18  8:15 AM  Result Value Ref Range Status   Specimen Description BLOOD RIGHT ANTECUBITAL  Final   Special Requests   Final    BOTTLES DRAWN AEROBIC ONLY Blood Culture adequate volume   Culture   Final    NO GROWTH < 24 HOURS Performed at [facility], [address], [place], [place] [address]    Report Status PENDING  Incomplete         Radiology Studies: Dg Chest Port 1 View  Result Date: 10/01/2018 CLINICAL DATA:  Dyspnea. EXAM: PORTABLE CHEST 1 VIEW COMPARISON:  Radiograph of Sep 23, 2018. FINDINGS: Stable cardiomediastinal silhouette. Interval placement of right-sided PICC line with distal tip in expected position of the SVC. No pneumothorax or significant pleural effusion is  noted. Mild bibasilar subsegmental atelectasis or infiltrates are noted. The visualized skeletal structures are unremarkable. IMPRESSION: Interval placement of right-sided PICC line with distal tip in expected position of the SVC. Mild bibasilar subsegmental atelectasis or infiltrate. Electronically Signed   By: Marijo Conception M.D.   On: 10/01/2018 10:03   Dg Abd Portable 1v  Result Date: 10/01/2018 CLINICAL DATA:  Persistent vomiting EXAM: PORTABLE ABDOMEN - 1 VIEW COMPARISON:  06/17/2017 abdominal CT FINDINGS: Moderate gaseous distention of the stomach. Overall nonobstructive bowel gas pattern. No concerning mass effect or calcification. Elevated right diaphragm also seen on prior. IMPRESSION: Moderate gaseous distention of the stomach. Electronically Signed   By: Monte Fantasia M.D.   On: 10/01/2018 04:38        Scheduled Meds:  cloNIDine  0.2 mg Transdermal Weekly   [START ON 10/05/2018] doxycycline  100 mg Oral Q12H   feeding supplement  1 Container Oral BID BM   feeding supplement (ENSURE ENLIVE)  237 mL Oral BID PC   feeding supplement (PRO-STAT SUGAR FREE 64)  30 mL Oral TID WC   folic acid  1 mg Oral Daily   furosemide  20 mg Oral BID   insulin aspart  0-9 Units Subcutaneous TID WC   lactulose  20 g Oral TID   multivitamin with minerals  1 tablet Oral Daily   nadolol  20 mg Oral Daily   pantoprazole  40 mg Oral BID   potassium chloride  40 mEq Oral Once   saccharomyces boulardii  250 mg Oral BID   sodium chloride flush  10-40 mL Intracatheter Q12H   thiamine  100 mg Oral Daily   Continuous  Infusions:  DAPTOmycin (CUBICIN)  IV Stopped (10/02/18 7342)     LOS: 12 days        Aline August, MD [facility] 10/02/2018, 10:45 AM

## 2018-10-02 NOTE — Progress Notes (Signed)
Pharmacy Antibiotic Note  Mark [name] is a 63 y.o. male admitted on 09/19/2018 with epidural abscess and septic arthritis of L3-L4 facet joints complicated with MRSA bacteremia. Pharmacy consulted for Daptomycin dosing.   The patient's AKI has resolved and Daptomycin dosing remains appropriate. CK on 6/3 was 493, up slightly to 500 on 6/6.  Will continue course. Stop date added for 6/8 to complete 14d duration - then plan per ID is to transition to Doxy po.   Plan:  - Continue Daptomycin 500 mg IV every 24 hours - Will continue to follow renal function, culture results, LOT, and antibiotic de-escalation plans   Height: 5\' 5"  (165.1 cm) Weight: 142 lb 10.2 oz (64.7 kg) IBW/kg (Calculated) : 61.5  Temp (24hrs), Avg:97.8 F (36.6 C), Min:97.4 F (36.3 C), Max:98.4 F (36.9 C)  Recent Labs  Lab 09/29/18 0442 09/30/18 0443 10/01/18 0400 10/02/18 0454  WBC  --  14.5* 21.5* 16.2*  CREATININE 0.91 0.77 0.81 0.81    Estimated Creatinine Clearance: 79.1 mL/min (by C-G formula based on SCr of 0.81 mg/dL).    No Known Allergies  Antimicrobials this admission: 5/25 Vancomycin  >> 5/26 5/25 Zosyn >> 5/26; 5/28>5/29 5/26 Cefazolin >> 5/26 5/26 Daptomycin >> [6/8], then doxy x 4 weeks - CK 12 (5/27), 493 (6/3), 500 (6/6) 5/29 Unasyn>> 6/1 6/1 Augmentin >> (6/5)  Dose adjustments this admission: N/A  Microbiology results: 6/5 BCx: ngtd 5/26 BCx: negative 5/25 BCx: MRSA 5/25 COVID negative  Thank you for allowing pharmacy to be a part of this patient's care.  [employer], Pharm.D., BCPS Clinical Pharmacist  **Pharmacist phone directory can now be found on [email or web address] ([email or web address]).  Listed under Man.  10/02/2018 8:20 AM

## 2018-10-03 LAB — AMMONIA: Ammonia: 30 umol/L (ref 9–35)

## 2018-10-03 LAB — GLUCOSE, CAPILLARY
Glucose-Capillary: 101 mg/dL — ABNORMAL HIGH (ref 70–99)
Glucose-Capillary: 107 mg/dL — ABNORMAL HIGH (ref 70–99)
Glucose-Capillary: 130 mg/dL — ABNORMAL HIGH (ref 70–99)
Glucose-Capillary: 132 mg/dL — ABNORMAL HIGH (ref 70–99)
Glucose-Capillary: 73 mg/dL (ref 70–99)
Glucose-Capillary: 88 mg/dL (ref 70–99)

## 2018-10-03 LAB — CBC WITH DIFFERENTIAL/PLATELET
Abs Immature Granulocytes: 0.04 10*3/uL (ref 0.00–0.07)
Basophils Absolute: 0 10*3/uL (ref 0.0–0.1)
Basophils Relative: 0 %
Eosinophils Absolute: 0.2 10*3/uL (ref 0.0–0.5)
Eosinophils Relative: 2 %
HCT: 21.4 % — ABNORMAL LOW (ref 39.0–52.0)
Hemoglobin: 6.9 g/dL — CL (ref 13.0–17.0)
Immature Granulocytes: 0 %
Lymphocytes Relative: 10 %
Lymphs Abs: 1 10*3/uL (ref 0.7–4.0)
MCH: 31.1 pg (ref 26.0–34.0)
MCHC: 32.2 g/dL (ref 30.0–36.0)
MCV: 96.4 fL (ref 80.0–100.0)
Monocytes Absolute: 1.3 10*3/uL — ABNORMAL HIGH (ref 0.1–1.0)
Monocytes Relative: 13 %
Neutro Abs: 7.5 10*3/uL (ref 1.7–7.7)
Neutrophils Relative %: 75 %
Platelets: 141 10*3/uL — ABNORMAL LOW (ref 150–400)
RBC: 2.22 MIL/uL — ABNORMAL LOW (ref 4.22–5.81)
RDW: 23.1 % — ABNORMAL HIGH (ref 11.5–15.5)
WBC: 10 10*3/uL (ref 4.0–10.5)
nRBC: 0 % (ref 0.0–0.2)

## 2018-10-03 LAB — COMPREHENSIVE METABOLIC PANEL
ALT: 75 U/L — ABNORMAL HIGH (ref 0–44)
AST: 180 U/L — ABNORMAL HIGH (ref 15–41)
Albumin: 1.4 g/dL — ABNORMAL LOW (ref 3.5–5.0)
Alkaline Phosphatase: 123 U/L (ref 38–126)
Anion gap: 4 — ABNORMAL LOW (ref 5–15)
BUN: 11 mg/dL (ref 8–23)
CO2: 25 mmol/L (ref 22–32)
Calcium: 7.6 mg/dL — ABNORMAL LOW (ref 8.9–10.3)
Chloride: 112 mmol/L — ABNORMAL HIGH (ref 98–111)
Creatinine, Ser: 0.79 mg/dL (ref 0.61–1.24)
GFR calc Af Amer: >60 mL/min (ref >60)
GFR calc non Af Amer: >60 mL/min (ref >60)
Glucose, Bld: 83 mg/dL (ref 70–99)
Potassium: 3.3 mmol/L — ABNORMAL LOW (ref 3.5–5.1)
Sodium: 141 mmol/L (ref 135–145)
Total Bilirubin: 0.8 mg/dL (ref 0.3–1.2)
Total Protein: 5 g/dL — ABNORMAL LOW (ref 6.5–8.1)

## 2018-10-03 LAB — MAGNESIUM: Magnesium: 1.4 mg/dL — ABNORMAL LOW (ref 1.7–2.4)

## 2018-10-03 LAB — HEMOGLOBIN AND HEMATOCRIT, BLOOD
HCT: 26 % — ABNORMAL LOW (ref 39.0–52.0)
Hemoglobin: 8.4 g/dL — ABNORMAL LOW (ref 13.0–17.0)

## 2018-10-03 LAB — PREPARE RBC (CROSSMATCH)

## 2018-10-03 MED ORDER — MAGNESIUM SULFATE 2 GM/50ML IV SOLN
2.0000 g | Freq: Once | INTRAVENOUS | Status: AC
  Administered 2018-10-03: 2 g via INTRAVENOUS
  Filled 2018-10-03: qty 50

## 2018-10-03 MED ORDER — SODIUM CHLORIDE 0.9% IV SOLUTION
Freq: Once | INTRAVENOUS | Status: AC
  Administered 2018-10-03: 13:00:00 via INTRAVENOUS

## 2018-10-03 MED ORDER — POTASSIUM CHLORIDE CRYS ER 20 MEQ PO TBCR
40.0000 meq | EXTENDED_RELEASE_TABLET | Freq: Once | ORAL | Status: AC
  Administered 2018-10-03: 40 meq via ORAL
  Filled 2018-10-03: qty 2

## 2018-10-03 NOTE — Progress Notes (Signed)
Occupational Therapy Treatment Patient Details Name: Mark [name] MRN: [id number] DOB: 1955/05/13 Today's Date: 10/03/2018    History of present illness 63 year old male with history of alcoholic liver cirrhosis, pancytopenia, hypertension, coronary artery disease, polysubstance abuse (alcohol, heroin, marijuana), CKD stage III and MSSA bacteremia in 04/2018 presented with worsening lower back pain.  COVID-19 rapid testing was negative on admission.  MRI of the lumbar spine revealed enhancement throughout the epidural space of L3 through the sacrum concerning for possible phlegmon/abscess, L5-S1 marrow enhancement concerning for septic arthritis, and spinal stenosis L3-S1   OT comments  Pt limited in today's session due to pain and low strength. Pt received in bed upon arrival ready to use BSC. Pt required level of assistance as mentioned below. Pt will benefit from continued therapy to address safety in functional transfers, strength, and engagement in ADLs for activity tolerance. DC and freq remains the same. OT will continue to follow acutely.    Follow Up Recommendations  SNF;Supervision/Assistance - 24 hour    Equipment Recommendations  None recommended by OT    Recommendations for Other Services      Precautions / Restrictions Precautions Precautions: Fall Restrictions Weight Bearing Restrictions: No       Mobility Bed Mobility Overal bed mobility: Needs Assistance Bed Mobility: Supine to Sit     Supine to sit: Mod assist     General bed mobility comments: Assist for BLE movement and trunk elevation with OTRs hand  Transfers Overall transfer level: Needs assistance Equipment used: Rolling walker (2 wheeled) Transfers: Squat Pivot Transfers     Squat pivot transfers: Max assist;+2 physical assistance;+2 safety/equipment     General transfer comment: pt required Max A+2 for toilet transfer from bed to [facility] with squat pivot.     Balance Overall balance  assessment: Needs assistance Sitting-balance support: No upper extremity supported;Feet supported Sitting balance-[name]: Fair     Standing balance support: Bilateral upper extremity supported Standing balance-[name]: Poor Standing balance comment: pt unable to tolerate standing position during today's session. required transfer.                           ADL either performed or assessed with clinical judgement   ADL Overall ADL's : Needs assistance/impaired                         Toilet Transfer: Maximal assistance;BSC;+2 for physical assistance;+2 for safety/equipment;Squat-pivot;Cueing for safety;Cueing for sequencing Toilet Transfer Details (indicate cue type and reason): Pt demonstrates inability to use RW due to UE weakness.  Toileting- Clothing Manipulation and Hygiene: Maximal assistance;Sitting/lateral lean;Sit to/from stand;+2 for physical assistance Toileting - Clothing Manipulation Details (indicate cue type and reason): Pt unable to perform sit to stand transition for toilet hygiene required to slightly bend forward with RW due to weakness.     Functional mobility during ADLs: Maximal assistance;+2 for physical assistance;Rolling walker General ADL Comments: Pt required Max A to return back to bed with lateral scoots on bed surface to come towards head of bed. Pt requires assist with LE to bring in to bed.      Vision   Vision Assessment?: No apparent visual deficits   Perception     Praxis      Cognition Arousal/Alertness: Awake/alert Behavior During Therapy: Impulsive;Flat affect Overall Cognitive Status: Impaired/Different from baseline Area of Impairment: Safety/judgement  Safety/Judgement: Decreased awareness of safety;Decreased awareness of deficits     General Comments: pt received in bed ready to use BSC. Really impulsive. Several cues for sequenicng and hand placement.          Exercises     Shoulder Instructions       General Comments      Pertinent Vitals/ Pain       Pain Assessment: Faces Faces Pain Scale: Hurts little more Pain Location: back Pain Descriptors / Indicators: Aching;Burning Pain Intervention(s): Limited activity within patient's tolerance;Monitored during session  Home Living                                          Prior Functioning/Environment              Frequency  Min 2X/week        Progress Toward Goals  OT Goals(current goals can now be found in the care plan section)  Progress towards OT goals: Not progressing toward goals - comment(Pt still required Max A+2 for transfers due to UE/LE weaknes)  Acute Rehab OT Goals Patient Stated Goal: to go home OT Goal Formulation: With patient Time For Goal Achievement: 10/15/18 Potential to Achieve Goals: Good ADL Goals Pt Will Perform Grooming: with modified independence;standing Pt Will Perform Lower Body Dressing: with supervision;sit to/from stand Pt Will Transfer to Toilet: with min guard assist;ambulating;regular height toilet Pt Will Perform Toileting - Clothing Manipulation and hygiene: with min assist;sit to/from stand Additional ADL Goal #1: Pt will stand at sink x5 mins with minguardA for ADL tasks iwth set-up and fair balance.  Plan Discharge plan remains appropriate;Frequency remains appropriate    Co-evaluation                 AM-PAC OT "6 Clicks" Daily Activity     Outcome Measure   Help from another person eating meals?: None Help from another person taking care of personal grooming?: A Little Help from another person toileting, which includes using toliet, bedpan, or urinal?: A Lot Help from another person bathing (including washing, rinsing, drying)?: A Lot Help from another person to put on and taking off regular upper body clothing?: A Little Help from another person to put on and taking off regular lower body clothing?:  A Lot 6 Click Score: 16    End of Session Equipment Utilized During Treatment: Gait belt;Rolling walker  OT Visit Diagnosis: Unsteadiness on feet (R26.81);Muscle weakness (generalized) (M62.81)   Activity Tolerance Patient limited by pain   Patient Left in bed;with bed alarm set   Nurse Communication Mobility status        Time: 1109-1140 OT Time Calculation (min): 31 min  Charges: OT General Charges $OT Visit: 1 Visit OT Treatments $Self Care/Home Management : 23-37 mins  Minus Breeding, MSOT, OTR/L  Supplemental Rehabilitation Services  [phone]   Marius Ditch 10/03/2018, 11:52 AM

## 2018-10-03 NOTE — Progress Notes (Signed)
Patient ID: Mark [name], male   DOB: 06-25-1955, 63 y.o.   MRN: [id number]  PROGRESS NOTE    Mark [name]  ZWC:[id number] DOB: July 28, 1955 DOA: 09/19/2018 PCP: [name], MD   Brief Narrative:  63 year old male with history of alcoholic liver cirrhosis, pancytopenia, hypertension, coronary artery disease, polysubstance abuse (alcohol, heroin, marijuana), CKD stage III and MSSA bacteremia in 04/2018 presented with worsening lower back pain.  COVID-19 rapid testing was negative on admission.  MRI of the lumbar spine revealed enhancement throughout the epidural space of L3 through the sacrum concerning for possible phlegmon/abscess, L5-S1 marrow enhancement concerning for septic arthritis, and spinal stenosis L3-S1. He was started on broad-spectrum antibiotics.  Neurosurgery was consulted.  IR was also consulted.  IR thought that there is no abscess/collection large enough to aspirate or drain.  Neurosurgery subsequently recommended medical management with antibiotics for now as patient is very high risk for surgical intervention.  He was found to have MRSA bacteremia and ID was consulted.  Antibiotics was switched to daptomycin.  Hospitalization has been complicated with waxing and waning mental status.  Palliative care was consulted and CODE STATUS has been changed to DNR.  He was also started on Unasyn for aspiration pneumonia.  ID recommends intravenous daptomycin for 2 weeks and 4 weeks of doxycycline.  ID has signed off.  Assessment & Plan:   Principal Problem:   MRSA bacteremia Active Problems:   Cirrhosis, alcoholic ([name])   Polysubstance abuse ([name])   Hyponatremia   Pancytopenia ([name])   Hepatic encephalopathy ([name])   Alcohol abuse   Septic arthritis ([name])   Anemia of chronic disease   Hypoalbuminemia   Hypoglycemia without diagnosis of diabetes mellitus   Epidural abscess   Abscess in epidural space of lumbar spine   Aspiration pneumonia ([name])   AKI (acute kidney  injury) ([name])   Bacteremia   Spinal stenosis of lumbar region without neurogenic claudication  Septic arthritis/epidural abscess of lumbar spine -MRI of the lumbar spine revealed enhancement throughout the epidural space of L3 through the sacrum concerning for possible phlegmon/abscess, L5-S1 marrow enhancement concerning for septic arthritis, and spinal stenosis L3-S1 -Patient was initially started on broad-spectrum antibiotics vancomycin and Zosyn.  Currently on daptomycin as per ID. -Neurosurgery had initially recommended IR consultation for probable drainage/biopsy -As per IR, the abscess/collection was not large enough to aspirate or drain.  IR has notified Dr. Zada Finders. -Neurosurgery found patient to be high risk for any surgical intervention and is recommended conservative management with IV antibiotics only. -No evidence of loss of bowel or bladder control for now -Continue pain management and supportive care.  Monitor mental status. -Currently afebrile.   -Given issues with IV access, patient received 2 doses of Zyvox on 09/26/2018. -PICC line was placed on 09/27/2018 by IR.  Now back on daptomycin.  Monitor CK intermittently.  ID recommends 2 weeks of daptomycin, last date is 10/04/2018; followed by 4 weeks of doxycycline.  MRSA bacteremia -Repeat blood cultures have been negative since 09/21/2018 -2D echo: Preserved ejection fraction, no wall motion normalities, no vegetations appreciated on any valve.  Mild regurgitation and he tricuspid valve was appreciated by color flow Doppler -ID has signed off.  Antibiotic plan as above.  Aspiration pneumonia -Completed treatment with Unasyn and Augmentin.  Chest x-ray on 10/01/2018 showed mild bibasilar atelectasis -Diet as per SLP recommendations  Elevated LFTs -Progressively increasing LFTs.  Will monitor.  If cholestasis increase, might need right upper quadrant ultrasound.  Leukocytosis -From  above.  Resolved.  Blood culture from 10/01/2018  is negative so far.  Acute kidney injury with metabolic acidosis  -Baseline creatinine of 0.95 on 08/22/2018.  Patient on Lasix and spironolactone at home which have remained on hold -Presented with creatinine of 1.72 which worsened up to 2.4 -Resolved -Renal ultrasound showed no obstructive uropathy or hydronephrosis -Strict input and output.  Daily weights.  Monitor creatinine  Anemia of chronic disease -Hemoglobin 6.9 today.  No signs of bleeding.  Status post 1 unit packed red cells transfusion on 10/02/2018.  Will transfuse 1 more unit today.  Monitor H&H.  Hypokalemia -Replace.  Patient refused to take orally.  Advised him to take oral potassium, if persistently refuses, will give IV potassium.  Repeat a.m. labs  Hypomagnesemia--replace.  Repeat a.m. labs.  Alcoholic cirrhosis with hepatic encephalopathy  Chronic thrombocytopenia -continue lactulose.  Patient intermittently refuses lactulose.  He only agreed for 1 dose of lactulose on 10/02/2018.  I told him again this morning that he needs to take lactulose as prescribed. -Continue nadolol. -Patient has had waxing and waning mental status during the hospitalization along with aspiration pneumonia as well.  We need to be careful with use of opioids and sedatives. -Lasix was restarted on 09/30/2018.  Monitor.  Spironolactone was restarted on 10/02/2018.  Polysubstance abuse -Patient admits to alcohol and recent heroin use prior to arrival -Continue multivitamin, thiamine and folate.  Hypoalbuminemia -Oral intake still very poor. -Follow nutrition recommendations  Generalized deconditioning -Overall prognosis is very poor.  Patient is noncompliant and still actively using drugs. -His mental status has been waxing and waning as well during the hospitalization.  Palliative care has been intermittently following the patient.  Patient has been made DNR as per palliative care discussions with family: Looking to maintain quality and avoid  heroic invasive measures. -If condition worsens, consider hospice/comfort measures. -PT recommends CIR placement.  CIR consulted.  Patient refused CIR placement.  Social worker consulted for nursing home placement.  I doubt that he would agree for that. -Over the last few days, patient has persistently requested to go home with PICC line.  I have also persistently stated that he will not be able to go home with a PICC line given history of drug abuse.  If he wants to go home, he will have to leave [facility].  -I spoke to daughter Eben Burow on phone on 10/01/2018 in detail and explained to her that his condition is progressively getting worse and he is not ready to be discharged home.  If he insists on being discharged, he will need to go home with hospice.  Daughter agrees and is concerned about his father and will try and talk to his father and mother as well regarding him.  Dysphagia -Diet as per SLP recommendations.   DVT prophylaxis: SCDs Code Status: DNR Family Communication: Spoke to daughter Eben Burow on phone on 10/01/2018 Disposition Plan: Depends on clinical outcome.  Will need to stay inpatient till 10/04/2018 to finish 14 days of IV daptomycin.  Consultants: Neurosurgery/ID/IR/palliative care  Procedures:  2D echo: IMPRESSIONS 1. The left ventricle has normal systolic function with an ejection fraction of 60-65%. The cavity size was normal. There is mildly increased left ventricular wall thickness. Left ventricular diastolic parameters were normal. 2. The right ventricle has normal systolic function. The cavity was normal. There is no increase in right ventricular wall thickness. 3. Left atrial size was severely dilated. 4. No evidence of mitral valve stenosis. 5. Mild thickening of  the aortic valve. Mild calcification of the aortic valve. No stenosis of the aortic valve  Antimicrobials:  Anti-infectives (From admission, onward)   Start     Dose/Rate Route Frequency Ordered  Stop   10/05/18 1000  doxycycline (VIBRA-TABS) tablet 100 mg     100 mg Oral Every 12 hours 09/30/18 1414 11/02/18 0959   09/30/18 1000  amoxicillin-clavulanate (AUGMENTIN) 875-125 MG per tablet 1 tablet  Status:  Discontinued     1 tablet Oral Every 12 hours 09/29/18 1548 09/29/18 1549   09/29/18 2200  amoxicillin-clavulanate (AUGMENTIN) 875-125 MG per tablet 1 tablet     1 tablet Oral Every 12 hours 09/29/18 1549 10/01/18 0959   09/27/18 2000  Ampicillin-Sulbactam (UNASYN) 3 g in sodium chloride 0.9 % 100 mL IVPB  Status:  Discontinued     3 g 200 mL/hr over 30 Minutes Intravenous Every 8 hours 09/26/18 2013 09/27/18 1237   09/27/18 2000  DAPTOmycin (CUBICIN) 500 mg in sodium chloride 0.9 % IVPB     500 mg 220 mL/hr over 30 Minutes Intravenous Daily 09/27/18 1617 10/04/18 2359   09/27/18 1245  amoxicillin-clavulanate (AUGMENTIN) 875-125 MG per tablet 1 tablet  Status:  Discontinued     1 tablet Oral Every 12 hours 09/27/18 1237 09/29/18 1548   09/27/18 1245  doxycycline (VIBRA-TABS) tablet 100 mg  Status:  Discontinued     100 mg Oral Every 12 hours 09/27/18 1237 09/27/18 1605   09/26/18 2200  amoxicillin-clavulanate (AUGMENTIN) 875-125 MG per tablet 1 tablet     1 tablet Oral Every 12 hours 09/26/18 2013 09/27/18 0939   09/26/18 2200  linezolid (ZYVOX) tablet 600 mg     600 mg Oral Every 12 hours 09/26/18 2013 09/27/18 0939   09/24/18 2000  DAPTOmycin (CUBICIN) 500 mg in sodium chloride 0.9 % IVPB  Status:  Discontinued     500 mg 220 mL/hr over 30 Minutes Intravenous Daily 09/23/18 2233 09/27/18 1455   09/24/18 1330  Ampicillin-Sulbactam (UNASYN) 3 g in sodium chloride 0.9 % 100 mL IVPB  Status:  Discontinued     3 g 200 mL/hr over 30 Minutes Intravenous Every 8 hours 09/24/18 1225 09/26/18 2013   09/24/18 0600  piperacillin-tazobactam (ZOSYN) IVPB 3.375 g  Status:  Discontinued     3.375 g 12.5 mL/hr over 240 Minutes Intravenous Every 8 hours 09/23/18 2233 09/24/18 1135   09/23/18  2300  piperacillin-tazobactam (ZOSYN) IVPB 3.375 g     3.375 g 100 mL/hr over 30 Minutes Intravenous  Once 09/23/18 2233 09/24/18 0318   09/21/18 1000  vancomycin (VANCOCIN) IVPB 750 mg/150 ml premix  Status:  Discontinued     750 mg 150 mL/hr over 60 Minutes Intravenous Every 24 hours 09/20/18 0949 09/21/18 0233   09/21/18 1000  DAPTOmycin (CUBICIN) 500 mg in sodium chloride 0.9 % IVPB  Status:  Discontinued     500 mg 220 mL/hr over 30 Minutes Intravenous Every 48 hours 09/21/18 0903 09/23/18 2233   09/21/18 0400  ceFAZolin (ANCEF) IVPB 2g/100 mL premix  Status:  Discontinued     2 g 200 mL/hr over 30 Minutes Intravenous Every 8 hours 09/21/18 0233 09/21/18 0900   09/20/18 1800  piperacillin-tazobactam (ZOSYN) IVPB 3.375 g  Status:  Discontinued     3.375 g 12.5 mL/hr over 240 Minutes Intravenous Every 8 hours 09/20/18 0929 09/20/18 0935   09/20/18 1500  piperacillin-tazobactam (ZOSYN) IVPB 3.375 g  Status:  Discontinued     3.375 g 12.5 mL/hr over  240 Minutes Intravenous Every 8 hours 09/20/18 0935 09/21/18 0233   09/20/18 1000  piperacillin-tazobactam (ZOSYN) IVPB 3.375 g  Status:  Discontinued     3.375 g 100 mL/hr over 30 Minutes Intravenous  Once 09/20/18 0928 09/20/18 0934   09/20/18 1000  vancomycin (VANCOCIN) 1,250 mg in sodium chloride 0.9 % 250 mL IVPB  Status:  Discontinued     1,250 mg 166.7 mL/hr over 90 Minutes Intravenous  Once 09/20/18 0928 09/20/18 0934   09/20/18 1000  vancomycin (VANCOCIN) 500 mg in sodium chloride 0.9 % 100 mL IVPB     500 mg 100 mL/hr over 60 Minutes Intravenous  Once 09/20/18 0937 09/20/18 1407   09/20/18 0715  vancomycin (VANCOCIN) IVPB 1000 mg/200 mL premix     1,000 mg 200 mL/hr over 60 Minutes Intravenous  Once 09/20/18 0706 09/20/18 0906   09/20/18 0715  piperacillin-tazobactam (ZOSYN) IVPB 3.375 g     3.375 g 100 mL/hr over 30 Minutes Intravenous  Once 09/20/18 0706 09/20/18 1008       Subjective: Patient seen and examined at  bedside. No fever, vomiting reported by nursing staff.  Patient is extremely poor historian.  He still intermittently refuses lactulose.  He is awake more this morning and his only concern is when he can go home. objective: Vitals:   10/02/18 1248 10/02/18 2107 10/03/18 0625 10/03/18 0706  BP: (!) 142/70 137/63 (!) 130/59 (!) 135/59  Pulse: 80 74 84 78  Resp: 16 17 16 20   Temp: 98.3 F (36.8 C) 99 F (37.2 C) 97.8 F (36.6 C)   TempSrc:      SpO2: 100% 98% 100% 100%  Weight:      Height:        Intake/Output Summary (Last 24 hours) at 10/03/2018 0813 Last data filed at 10/03/2018 0443 Gross per 24 hour  Intake 960 ml  Output 650 ml  Net 310 ml   Filed Weights   09/19/18 2011 09/20/18 1055  Weight: 61.2 kg 64.7 kg    Examination:  General exam: Looks chronically ill.  Looks older than stated age.  Answers slightly more questions today compared to yesterday.  Poor historian.   Respiratory system: Bilateral decreased breath sounds at bases with some scattered crackles mostly in the bases Cardiovascular system: S1-S2 heard, rate controlled gastrointestinal system: Abdomen is distended, soft and nontender. Normal bowel sounds heard. Extremities: No cyanosis.  Trace edema.   CNS: More awake this morning.  Answers some questions very slow to respond.  Almost oriented x3.  Moving extremities.  Psych: Flat affect.  Intermittently gets upset  lymph: No cervical lymphadenopathy Skin: No petechiae or ulcers or rashes  neck: No JVD.   Data Reviewed: I have personally reviewed following labs and imaging studies  CBC: Recent Labs  Lab 09/30/18 0443 10/01/18 0400 10/02/18 0454 10/03/18 0324  WBC 14.5* 21.5* 16.2* 10.0  NEUTROABS 12.3* 19.6* 13.4* 7.5  HGB 7.0* 7.5* 6.3* 6.9*  HCT 22.3* 23.0* 19.7* 21.4*  MCV 100.9* 101.3* 102.1* 96.4  PLT 126* 194 160 825*   Basic Metabolic Panel: Recent Labs  Lab 09/29/18 0442 09/30/18 0443 10/01/18 0400 10/02/18 0454 10/03/18 0324  NA   --  138 142 144 141  K  --  2.7* 3.3* 3.3* 3.3*  CL  --  114* 112* 115* 112*  CO2  --  18* 23 24 25   GLUCOSE  --  102* 125* 102* 83  BUN  --  8 15 18 11   CREATININE  0.91 0.77 0.81 0.81 0.79  CALCIUM  --  7.4* 7.9* 8.0* 7.6*  MG  --  1.6* 2.1 1.8 1.4*   GFR: Estimated Creatinine Clearance: 80.1 mL/min (by C-G formula based on SCr of 0.79 mg/dL). Liver Function Tests: Recent Labs  Lab 09/30/18 0443 10/02/18 0454 10/03/18 0324  AST 104* 129* 180*  ALT 38 53* 75*  ALKPHOS 117 108 123  BILITOT 0.8 0.9 0.8  PROT 4.9* 5.5* 5.0*  ALBUMIN 1.3* 1.5* 1.4*   No results for input(s): LIPASE, AMYLASE in the last 168 hours. Recent Labs  Lab 09/30/18 0443 10/02/18 0454 10/03/18 0324  AMMONIA 37* 26 30   Coagulation Profile: No results for input(s): INR, PROTIME in the last 168 hours. Cardiac Enzymes: Recent Labs  Lab 09/29/18 0442 10/02/18 0454  CKTOTAL 493* 500*   BNP (last 3 results) No results for input(s): PROBNP in the last 8760 hours. HbA1C: No results for input(s): HGBA1C in the last 72 hours. CBG: Recent Labs  Lab 10/02/18 1215 10/02/18 1707 10/02/18 2107 10/03/18 0041 10/03/18 0441  GLUCAP 87 85 85 132* 73   Lipid Profile: No results for input(s): CHOL, HDL, LDLCALC, TRIG, CHOLHDL, LDLDIRECT in the last 72 hours. Thyroid Function Tests: No results for input(s): TSH, T4TOTAL, FREET4, T3FREE, THYROIDAB in the last 72 hours. Anemia Panel: No results for input(s): VITAMINB12, FOLATE, FERRITIN, TIBC, IRON, RETICCTPCT in the last 72 hours. Sepsis Labs: No results for input(s): PROCALCITON, LATICACIDVEN in the last 168 hours.  Recent Results (from the past 240 hour(s))  Culture, blood (routine x 2)     Status: None (Preliminary result)   Collection Time: 10/01/18  8:15 AM  Result Value Ref Range Status   Specimen Description BLOOD RIGHT ANTECUBITAL  Final   Special Requests   Final    BOTTLES DRAWN AEROBIC ONLY Blood Culture adequate volume   Culture   Final     NO GROWTH 2 DAYS Performed at [facility], [address], [place], [place] [address]    Report Status PENDING  Incomplete         Radiology Studies: Dg Chest Port 1 View  Result Date: 10/01/2018 CLINICAL DATA:  Dyspnea. EXAM: PORTABLE CHEST 1 VIEW COMPARISON:  Radiograph of Sep 23, 2018. FINDINGS: Stable cardiomediastinal silhouette. Interval placement of right-sided PICC line with distal tip in expected position of the SVC. No pneumothorax or significant pleural effusion is noted. Mild bibasilar subsegmental atelectasis or infiltrates are noted. The visualized skeletal structures are unremarkable. IMPRESSION: Interval placement of right-sided PICC line with distal tip in expected position of the SVC. Mild bibasilar subsegmental atelectasis or infiltrate. Electronically Signed   By: Marijo Conception M.D.   On: 10/01/2018 10:03        Scheduled Meds:  sodium chloride   Intravenous Once   cloNIDine  0.2 mg Transdermal Weekly   [START ON 10/05/2018] doxycycline  100 mg Oral Q12H   feeding supplement  1 Container Oral BID BM   feeding supplement (ENSURE ENLIVE)  237 mL Oral BID PC   feeding supplement (PRO-STAT SUGAR FREE 64)  30 mL Oral TID WC   folic acid  1 mg Oral Daily   furosemide  20 mg Oral BID   insulin aspart  0-9 Units Subcutaneous TID WC   lactulose  20 g Oral TID   multivitamin with minerals  1 tablet Oral Daily   nadolol  20 mg Oral Daily   pantoprazole  40 mg Oral BID  saccharomyces boulardii  250 mg Oral BID   sodium chloride flush  10-40 mL Intracatheter Q12H   spironolactone  50 mg Oral BID   thiamine  100 mg Oral Daily   Continuous Infusions:  DAPTOmycin (CUBICIN)  IV 500 mg (10/02/18 2134)     LOS: 13 days        Aline August, MD [facility] 10/03/2018, 8:13 AM

## 2018-10-03 NOTE — Progress Notes (Signed)
CSW spoke with pt at bedside.  Pt has refused to discuss SNF options.  Pt stated that he wants to go home.    Reed Breech LCSWA [phone]

## 2018-10-04 LAB — TYPE AND SCREEN
ABO/RH(D): B POS
Antibody Screen: NEGATIVE
Unit division: 0
Unit division: 0

## 2018-10-04 LAB — BPAM RBC
Blood Product Expiration Date: 202006132359
Blood Product Expiration Date: 202006222359
ISSUE DATE / TIME: 202006060905
ISSUE DATE / TIME: 202006070643
Unit Type and Rh: 7300
Unit Type and Rh: 7300

## 2018-10-04 LAB — CBC WITH DIFFERENTIAL/PLATELET
Abs Immature Granulocytes: 0.04 10*3/uL (ref 0.00–0.07)
Basophils Absolute: 0.1 10*3/uL (ref 0.0–0.1)
Basophils Relative: 1 %
Eosinophils Absolute: 0.2 10*3/uL (ref 0.0–0.5)
Eosinophils Relative: 2 %
HCT: 26 % — ABNORMAL LOW (ref 39.0–52.0)
Hemoglobin: 8.3 g/dL — ABNORMAL LOW (ref 13.0–17.0)
Immature Granulocytes: 1 %
Lymphocytes Relative: 10 %
Lymphs Abs: 0.8 10*3/uL (ref 0.7–4.0)
MCH: 30.4 pg (ref 26.0–34.0)
MCHC: 31.9 g/dL (ref 30.0–36.0)
MCV: 95.2 fL (ref 80.0–100.0)
Monocytes Absolute: 1.1 10*3/uL — ABNORMAL HIGH (ref 0.1–1.0)
Monocytes Relative: 12 %
Neutro Abs: 6.4 10*3/uL (ref 1.7–7.7)
Neutrophils Relative %: 74 %
Platelets: 152 10*3/uL (ref 150–400)
RBC: 2.73 MIL/uL — ABNORMAL LOW (ref 4.22–5.81)
RDW: 21.2 % — ABNORMAL HIGH (ref 11.5–15.5)
WBC: 8.5 10*3/uL (ref 4.0–10.5)
nRBC: 0 % (ref 0.0–0.2)

## 2018-10-04 LAB — COMPREHENSIVE METABOLIC PANEL
ALT: 71 U/L — ABNORMAL HIGH (ref 0–44)
AST: 130 U/L — ABNORMAL HIGH (ref 15–41)
Albumin: 1.4 g/dL — ABNORMAL LOW (ref 3.5–5.0)
Alkaline Phosphatase: 139 U/L — ABNORMAL HIGH (ref 38–126)
Anion gap: 4 — ABNORMAL LOW (ref 5–15)
BUN: 11 mg/dL (ref 8–23)
CO2: 25 mmol/L (ref 22–32)
Calcium: 7.7 mg/dL — ABNORMAL LOW (ref 8.9–10.3)
Chloride: 110 mmol/L (ref 98–111)
Creatinine, Ser: 0.77 mg/dL (ref 0.61–1.24)
GFR calc Af Amer: >60 mL/min (ref >60)
GFR calc non Af Amer: >60 mL/min (ref >60)
Glucose, Bld: 106 mg/dL — ABNORMAL HIGH (ref 70–99)
Potassium: 3.7 mmol/L (ref 3.5–5.1)
Sodium: 139 mmol/L (ref 135–145)
Total Bilirubin: 1.1 mg/dL (ref 0.3–1.2)
Total Protein: 5.5 g/dL — ABNORMAL LOW (ref 6.5–8.1)

## 2018-10-04 LAB — GLUCOSE, CAPILLARY
Glucose-Capillary: 119 mg/dL — ABNORMAL HIGH (ref 70–99)
Glucose-Capillary: 123 mg/dL — ABNORMAL HIGH (ref 70–99)
Glucose-Capillary: 79 mg/dL (ref 70–99)
Glucose-Capillary: 90 mg/dL (ref 70–99)
Glucose-Capillary: 96 mg/dL (ref 70–99)
Glucose-Capillary: 98 mg/dL (ref 70–99)

## 2018-10-04 LAB — MAGNESIUM: Magnesium: 1.6 mg/dL — ABNORMAL LOW (ref 1.7–2.4)

## 2018-10-04 MED ORDER — MAGNESIUM SULFATE 2 GM/50ML IV SOLN
2.0000 g | Freq: Once | INTRAVENOUS | Status: AC
  Administered 2018-10-04: 2 g via INTRAVENOUS
  Filled 2018-10-04: qty 50

## 2018-10-04 MED ORDER — OXYCODONE HCL 5 MG PO TABS
5.0000 mg | ORAL_TABLET | ORAL | Status: DC | PRN
  Administered 2018-10-04 – 2018-10-05 (×5): 5 mg via ORAL
  Filled 2018-10-04 (×4): qty 1

## 2018-10-04 NOTE — Progress Notes (Signed)
PT Cancellation Note  Patient Details Name: Mark [name] MRN: [id number] DOB: Apr 18, 1956   Cancelled Treatment:    Reason Eval/Treat Not Completed: Other (comment)   Mr. Proto is angry, and tearful, and wants to go home;   Took time to talk with him, validate his frustrations;   He told me he doesn't want to walk now;   I intend to make every effort to come back for PT today (at some point after 30 minutes bedrest post PICC removal)  Roney Marion, [place] Pager [phone] Office [place] 10/04/2018, 11:56 AM

## 2018-10-04 NOTE — Progress Notes (Signed)
Patient ID: Mark [name], male   DOB: October 25, 1955, 63 y.o.   MRN: [id number]  PROGRESS NOTE    Mark [name]  WUJ:[id number] DOB: [birth year] DOA: 09/19/2018 PCP: [name], MD   Brief Narrative:  63 year old male with history of alcoholic liver cirrhosis, pancytopenia, hypertension, coronary artery disease, polysubstance abuse (alcohol, heroin, marijuana), CKD stage III and MSSA bacteremia in 04/2018 presented with worsening lower back pain.  COVID-19 rapid testing was negative on admission.  MRI of the lumbar spine revealed enhancement throughout the epidural space of L3 through the sacrum concerning for possible phlegmon/abscess, L5-S1 marrow enhancement concerning for septic arthritis, and spinal stenosis L3-S1. He was started on broad-spectrum antibiotics.  Neurosurgery was consulted.  IR was also consulted.  IR thought that there is no abscess/collection large enough to aspirate or drain.  Neurosurgery subsequently recommended medical management with antibiotics for now as patient is very high risk for surgical intervention.  He was found to have MRSA bacteremia and ID was consulted.  Antibiotics was switched to daptomycin.  Hospitalization has been complicated with waxing and waning mental status.  Palliative care was consulted and CODE STATUS has been changed to DNR.  He was also started on Unasyn for aspiration pneumonia.  ID recommends intravenous daptomycin for 2 weeks and 4 weeks of doxycycline.  ID has signed off.  Assessment & Plan:   Principal Problem:   MRSA bacteremia Active Problems:   Cirrhosis, alcoholic ([place])   Polysubstance abuse ([place])   Hyponatremia   Pancytopenia ([name])   Hepatic encephalopathy ([name])   Alcohol abuse   Septic arthritis ([name])   Anemia of chronic disease   Hypoalbuminemia   Hypoglycemia without diagnosis of diabetes mellitus   Epidural abscess   Abscess in epidural space of lumbar spine   Aspiration pneumonia ([name])   AKI (acute kidney  injury) ([name])   Bacteremia   Spinal stenosis of lumbar region without neurogenic claudication  Septic arthritis/epidural abscess of lumbar spine -MRI of the lumbar spine revealed enhancement throughout the epidural space of L3 through the sacrum concerning for possible phlegmon/abscess, L5-S1 marrow enhancement concerning for septic arthritis, and spinal stenosis L3-S1 -Patient was initially started on broad-spectrum antibiotics vancomycin and Zosyn.  Currently on daptomycin as per ID. -Neurosurgery had initially recommended IR consultation for probable drainage/biopsy -As per IR, the abscess/collection was not large enough to aspirate or drain.  IR has notified Dr. Zada Finders. -Neurosurgery found patient to be high risk for any surgical intervention and is recommended conservative management with IV antibiotics only. -No evidence of loss of bowel or bladder control for now -Continue pain management and supportive care.  Monitor mental status. -Currently afebrile.   -Given issues with IV access, patient received 2 doses of Zyvox on 09/26/2018. -PICC line was placed on 09/27/2018 by IR.  Now back on daptomycin.  Monitor CK intermittently.  ID recommends 2 weeks of daptomycin, last date is today; followed by 4 weeks of doxycycline. -DC PICC line.  MRSA bacteremia -Repeat blood cultures have been negative since 09/21/2018 -2D echo: Preserved ejection fraction, no wall motion normalities, no vegetations appreciated on any valve.  Mild regurgitation and he tricuspid valve was appreciated by color flow Doppler -ID has signed off.  Antibiotic plan as above.  Aspiration pneumonia -Completed treatment with Unasyn and Augmentin.  Chest x-ray on 10/01/2018 showed mild bibasilar atelectasis -Diet as per SLP recommendations  Elevated LFTs -LFTs almost the same as yesterday.  Will repeat LFTs in a.m.  Leukocytosis -From above.  Resolved.  Blood culture from 10/01/2018 is negative so far.  Acute kidney  injury with metabolic acidosis  -Baseline creatinine of 0.95 on 08/22/2018.  Patient on Lasix and spironolactone at home which have remained on hold -Presented with creatinine of 1.72 which worsened up to 2.4 -Resolved -Renal ultrasound showed no obstructive uropathy or hydronephrosis -Strict input and output.  Daily weights.  Monitor creatinine  Anemia of chronic disease -Hemoglobin stable today.  Status post 2 unit packed red cell transfusion during the hospitalization.  Monitor H&H.  No signs of bleeding.  Hypokalemia -Improved.  Hypomagnesemia--replace.  Repeat a.m. labs.  Alcoholic cirrhosis with hepatic encephalopathy  Chronic thrombocytopenia -continue lactulose.  Patient intermittently refuses lactulose.  I have told him multiple times that he needs to keep taking lactulose but still intermittently refuses -Continue nadolol. -Patient has had waxing and waning mental status during the hospitalization along with aspiration pneumonia as well.  We need to be careful with use of opioids and sedatives. -Lasix was restarted on 09/30/2018.  Monitor.  Spironolactone was restarted on 10/02/2018.  Polysubstance abuse -Patient admits to alcohol and recent heroin use prior to arrival -Continue multivitamin, thiamine and folate.  Hypoalbuminemia -Oral intake still very poor. -Follow nutrition recommendations  Generalized deconditioning -Overall prognosis is very poor.  Patient is noncompliant and still actively using drugs. -His mental status has been waxing and waning as well during the hospitalization.  Palliative care has been intermittently following the patient.  Patient has been made DNR as per palliative care discussions with family: Looking to maintain quality and avoid heroic invasive measures. -If condition worsens, consider hospice/comfort measures. -PT recommends CIR placement.  CIR consulted.  Patient refused CIR placement.  Social worker consulted for nursing home placement.  I  doubt that he would agree for that. -Patient states that he wants to go home again today.  He hardly can get out of bed.  He is maximal assist when evaluated by PT/OT.  PT recommends SNF placement.  He is alert awake oriented almost x3 and if he decides to go, he will have to sign out [place].  Spoke to daughter Eben Burow again today on phone and explained to her the same thing. -I think the best thing for him would be home with hospice.  Patient is currently refusing that that. -Very poor prognosis.  Will remove PICC line.  Dysphagia -Diet as per SLP recommendations.   DVT prophylaxis: SCDs Code Status: DNR Family Communication: Spoke to daughter Eben Burow on phone on 10/01/2018 and on 10/04/2018 Disposition Plan: SNF once bed is available.  Currently refusing the same. Consultants: Neurosurgery/ID/IR/palliative care  Procedures:  2D echo: IMPRESSIONS 1. The left ventricle has normal systolic function with an ejection fraction of 60-65%. The cavity size was normal. There is mildly increased left ventricular wall thickness. Left ventricular diastolic parameters were normal. 2. The right ventricle has normal systolic function. The cavity was normal. There is no increase in right ventricular wall thickness. 3. Left atrial size was severely dilated. 4. No evidence of mitral valve stenosis. 5. Mild thickening of the aortic valve. Mild calcification of the aortic valve. No stenosis of the aortic valve  Antimicrobials:  Anti-infectives (From admission, onward)   Start     Dose/Rate Route Frequency Ordered Stop   10/05/18 1000  doxycycline (VIBRA-TABS) tablet 100 mg     100 mg Oral Every 12 hours 09/30/18 1414 11/02/18 0959   09/30/18 1000  amoxicillin-clavulanate (AUGMENTIN) 875-125 MG per tablet 1 tablet  Status:  Discontinued     1 tablet Oral Every 12 hours 09/29/18 1548 09/29/18 1549   09/29/18 2200  amoxicillin-clavulanate (AUGMENTIN) 875-125 MG per tablet 1 tablet     1 tablet  Oral Every 12 hours 09/29/18 1549 10/01/18 0959   09/27/18 2000  Ampicillin-Sulbactam (UNASYN) 3 g in sodium chloride 0.9 % 100 mL IVPB  Status:  Discontinued     3 g 200 mL/hr over 30 Minutes Intravenous Every 8 hours 09/26/18 2013 09/27/18 1237   09/27/18 2000  DAPTOmycin (CUBICIN) 500 mg in sodium chloride 0.9 % IVPB     500 mg 220 mL/hr over 30 Minutes Intravenous Daily 09/27/18 1617 10/04/18 2359   09/27/18 1245  amoxicillin-clavulanate (AUGMENTIN) 875-125 MG per tablet 1 tablet  Status:  Discontinued     1 tablet Oral Every 12 hours 09/27/18 1237 09/29/18 1548   09/27/18 1245  doxycycline (VIBRA-TABS) tablet 100 mg  Status:  Discontinued     100 mg Oral Every 12 hours 09/27/18 1237 09/27/18 1605   09/26/18 2200  amoxicillin-clavulanate (AUGMENTIN) 875-125 MG per tablet 1 tablet     1 tablet Oral Every 12 hours 09/26/18 2013 09/27/18 0939   09/26/18 2200  linezolid (ZYVOX) tablet 600 mg     600 mg Oral Every 12 hours 09/26/18 2013 09/27/18 0939   09/24/18 2000  DAPTOmycin (CUBICIN) 500 mg in sodium chloride 0.9 % IVPB  Status:  Discontinued     500 mg 220 mL/hr over 30 Minutes Intravenous Daily 09/23/18 2233 09/27/18 1455   09/24/18 1330  Ampicillin-Sulbactam (UNASYN) 3 g in sodium chloride 0.9 % 100 mL IVPB  Status:  Discontinued     3 g 200 mL/hr over 30 Minutes Intravenous Every 8 hours 09/24/18 1225 09/26/18 2013   09/24/18 0600  piperacillin-tazobactam (ZOSYN) IVPB 3.375 g  Status:  Discontinued     3.375 g 12.5 mL/hr over 240 Minutes Intravenous Every 8 hours 09/23/18 2233 09/24/18 1135   09/23/18 2300  piperacillin-tazobactam (ZOSYN) IVPB 3.375 g     3.375 g 100 mL/hr over 30 Minutes Intravenous  Once 09/23/18 2233 09/24/18 0318   09/21/18 1000  vancomycin (VANCOCIN) IVPB 750 mg/150 ml premix  Status:  Discontinued     750 mg 150 mL/hr over 60 Minutes Intravenous Every 24 hours 09/20/18 0949 09/21/18 0233   09/21/18 1000  DAPTOmycin (CUBICIN) 500 mg in sodium chloride 0.9 %  IVPB  Status:  Discontinued     500 mg 220 mL/hr over 30 Minutes Intravenous Every 48 hours 09/21/18 0903 09/23/18 2233   09/21/18 0400  ceFAZolin (ANCEF) IVPB 2g/100 mL premix  Status:  Discontinued     2 g 200 mL/hr over 30 Minutes Intravenous Every 8 hours 09/21/18 0233 09/21/18 0900   09/20/18 1800  piperacillin-tazobactam (ZOSYN) IVPB 3.375 g  Status:  Discontinued     3.375 g 12.5 mL/hr over 240 Minutes Intravenous Every 8 hours 09/20/18 0929 09/20/18 0935   09/20/18 1500  piperacillin-tazobactam (ZOSYN) IVPB 3.375 g  Status:  Discontinued     3.375 g 12.5 mL/hr over 240 Minutes Intravenous Every 8 hours 09/20/18 0935 09/21/18 0233   09/20/18 1000  piperacillin-tazobactam (ZOSYN) IVPB 3.375 g  Status:  Discontinued     3.375 g 100 mL/hr over 30 Minutes Intravenous  Once 09/20/18 0928 09/20/18 0934   09/20/18 1000  vancomycin (VANCOCIN) 1,250 mg in sodium chloride 0.9 % 250 mL IVPB  Status:  Discontinued     1,250 mg 166.7 mL/hr over 90 Minutes  Intravenous  Once 09/20/18 0928 09/20/18 0934   09/20/18 1000  vancomycin (VANCOCIN) 500 mg in sodium chloride 0.9 % 100 mL IVPB     500 mg 100 mL/hr over 60 Minutes Intravenous  Once 09/20/18 0937 09/20/18 1407   09/20/18 0715  vancomycin (VANCOCIN) IVPB 1000 mg/200 mL premix     1,000 mg 200 mL/hr over 60 Minutes Intravenous  Once 09/20/18 0706 09/20/18 0906   09/20/18 0715  piperacillin-tazobactam (ZOSYN) IVPB 3.375 g     3.375 g 100 mL/hr over 30 Minutes Intravenous  Once 09/20/18 0706 09/20/18 1008        Subjective: Patient seen and examined at bedside.  Patient is awake and still stating that he wants to go home.  Poor historian.  No overnight fever or vomiting.  Still intermittently refusing lactulose.  Very deconditioned as per nursing staff and hardly able to sit up. objective: Vitals:   10/03/18 1413 10/03/18 2017 10/04/18 0000 10/04/18 0446  BP: (!) 148/64 (!) 133/59 138/61 (!) 152/86  Pulse: 79 87 88 80  Resp:  18  18   Temp:  99.1 F (37.3 C)  99.3 F (37.4 C)  TempSrc:      SpO2:  96%  98%  Weight:      Height:        Intake/Output Summary (Last 24 hours) at 10/04/2018 1136 Last data filed at 10/03/2018 1400 Gross per 24 hour  Intake 480 ml  Output -  Net 480 ml   Filed Weights   09/19/18 2011 09/20/18 1055  Weight: 61.2 kg 64.7 kg    Examination:  General exam: Looks chronically ill.  Looks older than stated age.  Awake and almost oriented x3.  Poor historian.  Keeps stating that he wants to go home.  Respiratory system: Bilateral decreased breath sounds at bases with some scattered crackles mostly in the bases, no wheezing Cardiovascular system: Rate controlled, S1-S2 heard gastrointestinal system: Abdomen is distended, soft and nontender. Normal bowel sounds heard. Extremities: No cyanosis.  Trace edema.    Data Reviewed: I have personally reviewed following labs and imaging studies  CBC: Recent Labs  Lab 09/30/18 0443 10/01/18 0400 10/02/18 0454 10/03/18 0324 10/03/18 1820 10/04/18 0340  WBC 14.5* 21.5* 16.2* 10.0  --  8.5  NEUTROABS 12.3* 19.6* 13.4* 7.5  --  6.4  HGB 7.0* 7.5* 6.3* 6.9* 8.4* 8.3*  HCT 22.3* 23.0* 19.7* 21.4* 26.0* 26.0*  MCV 100.9* 101.3* 102.1* 96.4  --  95.2  PLT 126* 194 160 141*  --  706   Basic Metabolic Panel: Recent Labs  Lab 09/30/18 0443 10/01/18 0400 10/02/18 0454 10/03/18 0324 10/04/18 0340  NA 138 142 144 141 139  K 2.7* 3.3* 3.3* 3.3* 3.7  CL 114* 112* 115* 112* 110  CO2 18* 23 24 25 25   GLUCOSE 102* 125* 102* 83 106*  BUN 8 15 18 11 11   CREATININE 0.77 0.81 0.81 0.79 0.77  CALCIUM 7.4* 7.9* 8.0* 7.6* 7.7*  MG 1.6* 2.1 1.8 1.4* 1.6*   GFR: Estimated Creatinine Clearance: 80.1 mL/min (by C-G formula based on SCr of 0.77 mg/dL). Liver Function Tests: Recent Labs  Lab 09/30/18 0443 10/02/18 0454 10/03/18 0324 10/04/18 0340  AST 104* 129* 180* 130*  ALT 38 53* 75* 71*  ALKPHOS 117 108 123 139*  BILITOT 0.8 0.9 0.8 1.1  PROT  4.9* 5.5* 5.0* 5.5*  ALBUMIN 1.3* 1.5* 1.4* 1.4*   No results for input(s): LIPASE, AMYLASE in the last 168  hours. Recent Labs  Lab 09/30/18 0443 10/02/18 0454 10/03/18 0324  AMMONIA 37* 26 30   Coagulation Profile: No results for input(s): INR, PROTIME in the last 168 hours. Cardiac Enzymes: Recent Labs  Lab 09/29/18 0442 10/02/18 0454  CKTOTAL 493* 500*   BNP (last 3 results) No results for input(s): PROBNP in the last 8760 hours. HbA1C: No results for input(s): HGBA1C in the last 72 hours. CBG: Recent Labs  Lab 10/03/18 1645 10/03/18 2014 10/04/18 0049 10/04/18 0444 10/04/18 0744  GLUCAP 130* 88 90 96 123*   Lipid Profile: No results for input(s): CHOL, HDL, LDLCALC, TRIG, CHOLHDL, LDLDIRECT in the last 72 hours. Thyroid Function Tests: No results for input(s): TSH, T4TOTAL, FREET4, T3FREE, THYROIDAB in the last 72 hours. Anemia Panel: No results for input(s): VITAMINB12, FOLATE, FERRITIN, TIBC, IRON, RETICCTPCT in the last 72 hours. Sepsis Labs: No results for input(s): PROCALCITON, LATICACIDVEN in the last 168 hours.  Recent Results (from the past 240 hour(s))  Culture, blood (routine x 2)     Status: None (Preliminary result)   Collection Time: 10/01/18  8:15 AM  Result Value Ref Range Status   Specimen Description BLOOD RIGHT ANTECUBITAL  Final   Special Requests   Final    BOTTLES DRAWN AEROBIC ONLY Blood Culture adequate volume   Culture   Final    NO GROWTH 3 DAYS Performed at [facility], [address] [place], [place] [address]    Report Status PENDING  Incomplete         Radiology Studies: No results found.      Scheduled Meds: . cloNIDine  0.2 mg Transdermal Weekly  . [START ON 10/05/2018] doxycycline  100 mg Oral Q12H  . feeding supplement  1 Container Oral BID BM  . feeding supplement (ENSURE ENLIVE)  237 mL Oral BID PC  . feeding supplement (PRO-STAT SUGAR FREE 64)  30 mL Oral TID WC  . folic acid  1 mg Oral Daily  .  furosemide  20 mg Oral BID  . insulin aspart  0-9 Units Subcutaneous TID WC  . lactulose  20 g Oral TID  . multivitamin with minerals  1 tablet Oral Daily  . nadolol  20 mg Oral Daily  . pantoprazole  40 mg Oral BID  . saccharomyces boulardii  250 mg Oral BID  . sodium chloride flush  10-40 mL Intracatheter Q12H  . spironolactone  50 mg Oral BID  . thiamine  100 mg Oral Daily   Continuous Infusions: . DAPTOmycin (CUBICIN)  IV 500 mg (10/03/18 2257)     LOS: 14 days        Aline August, MD [facility] 10/04/2018, 11:36 AM

## 2018-10-04 NOTE — Progress Notes (Signed)
Physical Therapy Treatment Patient Details Name: Mark [name] MRN: [id number] DOB: 10/05/1955 Today's Date: 10/04/2018    History of Present Illness 63 year old male with history of alcoholic liver cirrhosis, pancytopenia, hypertension, coronary artery disease, polysubstance abuse (alcohol, heroin, marijuana), CKD stage III and MSSA bacteremia in 04/2018 presented with worsening lower back pain.  COVID-19 rapid testing was negative on admission.  MRI of the lumbar spine revealed enhancement throughout the epidural space of L3 through the sacrum concerning for possible phlegmon/abscess, L5-S1 marrow enhancement concerning for septic arthritis, and spinal stenosis L3-S1    PT Comments    Attempted to work on mobility/transfers/amb, however Mr. Asencio declined, stating he had too much on his mind, and that he was comfortable and did not want to move; We discussed his strong desire to go home; I mentioned it takes a lot of assistance to get up, get to [facility], etc, and he tells me he has lots of help at home;   Given his refusal of SNF for rehab, would recommend maximizing [facility] services; At one point, was there consideration of Hospice/Palliative Care support?  I did mention that if he leaves AMA it is possible that insurance would not cover equipment and services.   Follow Up Recommendations  Home health PT;Supervision/Assistance - 24 hour;Other (comment)(HHOT, HHAide, HHRN, HHSW)     Equipment Recommendations  Wheelchair cushion (measurements PT);Wheelchair (measurements PT);Hospital bed; also consider non-emergent ambulance transport home   Recommendations for Other Services       Precautions / Restrictions Precautions Precautions: Fall    Mobility  Bed Mobility               General bed mobility comments: Declined  Transfers                 General transfer comment: Declined  Ambulation/Gait             General Gait Details: Declined   Stairs             Wheelchair Mobility    Modified Rankin (Stroke Patients Only)       Balance                                            Cognition Arousal/Alertness: Awake/alert Behavior During Therapy: Restless;Anxious Overall Cognitive Status: Impaired/Different from baseline Area of Impairment: Safety/judgement                         Safety/Judgement: Decreased awareness of safety;Decreased awareness of deficits            Exercises      General Comments General comments (skin integrity, edema, etc.): See PT comments      Pertinent Vitals/Pain Pain Assessment: Faces Faces Pain Scale: Hurts little more Pain Location: back Pain Descriptors / Indicators: Aching Pain Intervention(s): Limited activity within patient's tolerance    Home Living                      Prior Function            PT Goals (current goals can now be found in the care plan section) Acute Rehab PT Goals Patient Stated Goal: Clearly stating he wants to go home throught session PT Goal Formulation: With patient Time For Goal Achievement: 10/14/18 Potential to Achieve Goals: Good Progress towards PT goals: Not  progressing toward goals - comment(Very focused on wanting to get home)    Frequency    Min 3X/week      PT Plan Discharge plan needs to be updated    Co-evaluation              AM-PAC PT "6 Clicks" Mobility   Outcome Measure  Help needed turning from your back to your side while in a flat bed without using bedrails?: A Lot Help needed moving from lying on your back to sitting on the side of a flat bed without using bedrails?: A Lot Help needed moving to and from a bed to a chair (including a wheelchair)?: Total Help needed standing up from a chair using your arms (e.g., wheelchair or bedside chair)?: Total Help needed to walk in hospital room?: Total Help needed climbing 3-5 steps with a railing? : Total 6 Click Score: 8    End of  Session   Activity Tolerance: Other (comment)(decr participation) Patient left: in bed;with call bell/phone within reach;with bed alarm set Nurse Communication: Mobility status PT Visit Diagnosis: Unsteadiness on feet (R26.81)     Time: 5997-7414 PT Time Calculation (min) (ACUTE ONLY): 13 min  Charges:  $Self Care/Home Management: [place], [place] Pager [phone] Office [place] 10/04/2018, 3:49 PM

## 2018-10-04 NOTE — Progress Notes (Signed)
Told nurse

## 2018-10-04 NOTE — Progress Notes (Signed)
NP on call notified of pt refusing care & trying to leave AMA. Spoke with pt's wife who stated she was not coming to get the patient & that she would come up to see pt tomorrow morning. Pt informed that his wife stated she would come see him tomorrow. Per pt he has been lied to by the doctor saying he could leave but now not allowing him. Pt informed that SNF is recommended. Per pt he knows that but he wants to go home. Pt informed that he needs rehab & that his wife stated she can't physically take care of him. Per pt his friend is coming to get him & he needs help getting dressed. Pt refusing to get IV access so he therefore has no way of getting his last dose of cubicin. NP aware. Will continue to monitor pt. Hoover Brunette, RN

## 2018-10-05 ENCOUNTER — Telehealth: Payer: Self-pay | Admitting: *Deleted

## 2018-10-05 LAB — GLUCOSE, CAPILLARY
Glucose-Capillary: 104 mg/dL — ABNORMAL HIGH (ref 70–99)
Glucose-Capillary: 125 mg/dL — ABNORMAL HIGH (ref 70–99)
Glucose-Capillary: 87 mg/dL (ref 70–99)
Glucose-Capillary: 90 mg/dL (ref 70–99)

## 2018-10-05 LAB — COMPREHENSIVE METABOLIC PANEL
ALT: 56 U/L — ABNORMAL HIGH (ref 0–44)
AST: 86 U/L — ABNORMAL HIGH (ref 15–41)
Albumin: 1.3 g/dL — ABNORMAL LOW (ref 3.5–5.0)
Alkaline Phosphatase: 117 U/L (ref 38–126)
Anion gap: 6 (ref 5–15)
BUN: 10 mg/dL (ref 8–23)
CO2: 27 mmol/L (ref 22–32)
Calcium: 7.8 mg/dL — ABNORMAL LOW (ref 8.9–10.3)
Chloride: 105 mmol/L (ref 98–111)
Creatinine, Ser: 0.78 mg/dL (ref 0.61–1.24)
GFR calc Af Amer: >60 mL/min (ref >60)
GFR calc non Af Amer: >60 mL/min (ref >60)
Glucose, Bld: 115 mg/dL — ABNORMAL HIGH (ref 70–99)
Potassium: 3.4 mmol/L — ABNORMAL LOW (ref 3.5–5.1)
Sodium: 138 mmol/L (ref 135–145)
Total Bilirubin: 0.9 mg/dL (ref 0.3–1.2)
Total Protein: 5.3 g/dL — ABNORMAL LOW (ref 6.5–8.1)

## 2018-10-05 LAB — MAGNESIUM: Magnesium: 1.6 mg/dL — ABNORMAL LOW (ref 1.7–2.4)

## 2018-10-05 LAB — CBC WITH DIFFERENTIAL/PLATELET
Abs Immature Granulocytes: 0.02 10*3/uL (ref 0.00–0.07)
Basophils Absolute: 0.1 10*3/uL (ref 0.0–0.1)
Basophils Relative: 1 %
Eosinophils Absolute: 0.1 10*3/uL (ref 0.0–0.5)
Eosinophils Relative: 2 %
HCT: 25 % — ABNORMAL LOW (ref 39.0–52.0)
Hemoglobin: 8.2 g/dL — ABNORMAL LOW (ref 13.0–17.0)
Immature Granulocytes: 0 %
Lymphocytes Relative: 12 %
Lymphs Abs: 0.8 10*3/uL (ref 0.7–4.0)
MCH: 31.1 pg (ref 26.0–34.0)
MCHC: 32.8 g/dL (ref 30.0–36.0)
MCV: 94.7 fL (ref 80.0–100.0)
Monocytes Absolute: 1.1 10*3/uL — ABNORMAL HIGH (ref 0.1–1.0)
Monocytes Relative: 16 %
Neutro Abs: 4.7 10*3/uL (ref 1.7–7.7)
Neutrophils Relative %: 69 %
Platelets: 138 10*3/uL — ABNORMAL LOW (ref 150–400)
RBC: 2.64 MIL/uL — ABNORMAL LOW (ref 4.22–5.81)
RDW: 20.3 % — ABNORMAL HIGH (ref 11.5–15.5)
WBC: 6.8 10*3/uL (ref 4.0–10.5)
nRBC: 0 % (ref 0.0–0.2)

## 2018-10-05 MED ORDER — SPIRONOLACTONE 50 MG PO TABS: 50.0000 mg | ORAL_TABLET | Freq: Two times a day (BID) | ORAL | 0 refills | Status: DC

## 2018-10-05 MED ORDER — DOXYCYCLINE HYCLATE 100 MG PO TABS: 100.0000 mg | ORAL_TABLET | Freq: Two times a day (BID) | ORAL | 0 refills | Status: DC

## 2018-10-05 MED ORDER — OXYCODONE HCL 5 MG PO TABS: 5.0000 mg | ORAL_TABLET | ORAL | 0 refills | Status: DC | PRN

## 2018-10-05 MED ORDER — THIAMINE HCL 100 MG PO TABS: 100.0000 mg | ORAL_TABLET | Freq: Every day | ORAL | 0 refills | Status: DC

## 2018-10-05 MED ORDER — DIPHENHYDRAMINE-ZINC ACETATE 2-0.1 % EX CREA: 1.0000 "application " | TOPICAL_CREAM | Freq: Three times a day (TID) | CUTANEOUS | 0 refills | Status: DC | PRN

## 2018-10-05 MED ORDER — PANTOPRAZOLE SODIUM 40 MG PO TBEC: 40.0000 mg | DELAYED_RELEASE_TABLET | Freq: Every day | ORAL | 0 refills | Status: DC

## 2018-10-05 MED ORDER — ONDANSETRON HCL 4 MG PO TABS: 4.0000 mg | ORAL_TABLET | Freq: Four times a day (QID) | ORAL | 0 refills | Status: DC | PRN

## 2018-10-05 MED ORDER — FUROSEMIDE 20 MG PO TABS: 20.0000 mg | ORAL_TABLET | Freq: Two times a day (BID) | ORAL | 0 refills | Status: DC

## 2018-10-05 MED ORDER — ADULT MULTIVITAMIN W/MINERALS CH: 1.0000 | ORAL_TABLET | Freq: Every day | ORAL | Status: DC

## 2018-10-05 NOTE — Discharge Summary (Signed)
Physician Discharge Summary  Mark [name] JOA:[id number] DOB: 10/15/1955 DOA: 09/19/2018  PCP: [name], MD  Admit date: 09/19/2018 Discharge date: 10/05/2018  Admitted From: Home Disposition: Home with hospice  Recommendations for Outpatient Follow-up:  1. Follow up with home hospice at earliest convenience. 2. Follow-up with ID and neurosurgery as an outpatient    Home Health: Home hospice Equipment/Devices: None  Discharge Condition: Poor CODE STATUS: DNR Diet recommendation: Heart healthy  Brief/Interim Summary: 63 year old male with history of alcoholic liver cirrhosis, pancytopenia, hypertension, coronary artery disease, polysubstance abuse(alcohol, heroin, marijuana), CKD stage III and MSSA bacteremia in 04/2018 presented with worsening lower back pain. COVID-19 rapid testing was negative on admission. MRI of the lumbar spine revealed enhancement throughout the epidural space of L3 through the sacrum concerning for possible phlegmon/abscess, L5-S1 marrow enhancement concerning for septic arthritis, and spinal stenosis L3-S1. He was started on broad-spectrum antibiotics. Neurosurgery was consulted.  IR was also consulted.  IR thought that there is no abscess/collection large enough to aspirate or drain.  Neurosurgery subsequently recommended medical management with antibiotics for now as patient is very high risk for surgical intervention.  He was found to have MRSA bacteremia and ID was consulted.  Antibiotics was switched to daptomycin.  Hospitalization has been complicated with waxing and waning mental status.  Palliative care was consulted and CODE STATUS has been changed to DNR.  He was also started on Unasyn for aspiration pneumonia.  ID recommends intravenous daptomycin for 2 weeks and 4 weeks of doxycycline.  ID has signed off.  He has completed daptomycin treatment.  He is very deconditioned.  PT recommended CIR initially.  He refused CIR.  Subsequently PT  recommended SNF placement.  He has refused SNF as well.  After discussion with patient and family members today, he has agreed for home hospice.  He will be discharged home with hospice once arrangements have been made.  Discharge Diagnoses:  Principal Problem:   MRSA bacteremia Active Problems:   Cirrhosis, alcoholic ([place])   Polysubstance abuse ([place])   Hyponatremia   Pancytopenia ([name])   Hepatic encephalopathy ([name])   Alcohol abuse   Septic arthritis ([name])   Anemia of chronic disease   Hypoalbuminemia   Hypoglycemia without diagnosis of diabetes mellitus   Epidural abscess   Abscess in epidural space of lumbar spine   Aspiration pneumonia ([name])   AKI (acute kidney injury) ([place])   Bacteremia   Spinal stenosis of lumbar region without neurogenic claudication  Septic arthritis/epidural abscess of lumbar spine -MRI of the lumbar spine revealed enhancement throughout the epidural space of L3 through the sacrum concerning for possible phlegmon/abscess, L5-S1 marrow enhancement concerning for septic arthritis, and spinal stenosis L3-S1 -Patient was initially started on broad-spectrum antibiotics vancomycin and Zosyn.    Subsequently switched to daptomycin as per ID. -Neurosurgery had initially recommended IR consultation for probable drainage/biopsy -As per IR, the abscess/collection was not large enough to aspirate or drain. IR has notified Dr. Zada Finders. -Neurosurgery found patient to be high risk for any surgical intervention and is recommended conservative management with IV antibiotics only. -No evidence of loss of bowel or bladder control for now -Continue pain management and supportive care.  Monitor mental status. -Currently afebrile.   -Given issues with IV access, patient received 2 doses of Zyvox on 09/26/2018. -PICC line was placed on 09/27/2018 by IR.    Subsequently daptomycin was continued.   He has completed daptomycin for 2 weeks on 10/04/2018.  PICC line was discontinued  on  10/04/2018.  ID recommends 4 more weeks of doxycycline.  Outpatient follow-up with ID and neurosurgery.  Overall prognosis is very poor.  MRSA bacteremia -Repeat blood cultures have been negative since 09/21/2018 -2D echo: Preserved ejection fraction, no wall motion normalities, no vegetations appreciated on any valve. Mild regurgitation and he tricuspid valve was appreciated by color flow Doppler -ID has signed off.  Antibiotic plan as above.  Aspiration pneumonia -Completed treatment with Unasyn and Augmentin.  Chest x-ray on 10/01/2018 showed mild bibasilar atelectasis -Diet as per SLP recommendations  Elevated LFTs -Improving.  Leukocytosis -From above.  Resolved.  Blood culture from 10/01/2018 is negative so far.  Acute kidney injury with metabolic acidosis  -Baseline creatinine of 0.95 on 08/22/2018.  Patient on Lasix and spironolactone at home which have remained on hold -Presented with creatinine of 1.72 which worsened up to 2.4 -Resolved -Renal ultrasound showed no obstructive uropathy or hydronephrosis -Outpatient follow-up  Anemia of chronic disease -Hemoglobin stable today.  Status post 2 unit packed red cell transfusion during the hospitalization.  Outpatient follow-up  Hypokalemia -Improved.  Continue supplementation as an outpatient   Alcoholic cirrhosis with hepatic encephalopathy  Chronic thrombocytopenia -continue lactulose.  Patient intermittently refuses lactulose.  I have told him multiple times that he needs to keep taking lactulose but still intermittently refuses -Continue nadolol. -Patient has had waxing and waning mental status during the hospitalization along with aspiration pneumonia as well.  -Lasix was restarted on 09/30/2018.  Monitor.  Spironolactone was restarted on 10/02/2018.  Polysubstance abuse -Patient admits to alcohol and recent heroin use prior to arrival -Continue multivitamin, thiamine and folate. -Consult multiple number of times  regarding abstinence from illicit use.  Hypoalbuminemia -Oral intake still very poor. -Follow nutrition recommendations  Generalized deconditioning -Overall prognosis is very poor.  Patient is noncompliant and still actively using drugs. -His mental status has been waxing and waning as well during the hospitalization.  Palliative care has been intermittently following the patient.  Patient has been made DNR as per palliative care discussions with family: Looking to maintain quality and avoid heroic invasive measures. -Patient has refused CIR or nursing home placement. -He hardly can get out of bed.  He is maximal assist when evaluated by PT/OT.He is alert awake oriented almost x3 spoke with the patient in front of multiple family members including wife and daughter along with social worker and patient's nurse, it was decided that patient will go home with hospice.  If condition rapidly worsens, consider total comfort measures.    Dysphagia -Diet as per SLP recommendations.  Discharge Instructions  Discharge Instructions    Ambulatory referral to Infectious Disease   Complete by:  As directed    Epidural abscess/Bacteremia   Diet - low sodium heart healthy   Complete by:  As directed    Increase activity slowly   Complete by:  As directed      Allergies as of 10/05/2018   No Known Allergies     Medication List    STOP taking these medications   guaiFENesin 600 MG 12 hr tablet Commonly known as:  MUCINEX   silver sulfADIAZINE 1 % cream Commonly known as:  SILVADENE     TAKE these medications   albuterol 108 (90 Base) MCG/ACT inhaler Commonly known as:  VENTOLIN HFA Inhale 2 puffs into the lungs every 6 (six) hours as needed for wheezing or shortness of breath.   diphenhydrAMINE-zinc acetate cream Commonly known as:  Benadryl Extra Strength Apply 1  application topically 3 (three) times daily as needed for itching.   doxycycline 100 MG tablet Commonly known as:   VIBRA-TABS Take 1 tablet (100 mg total) by mouth every 12 (twelve) hours.   folic acid 1 MG tablet Commonly known as:  FOLVITE TAKE 1 TABLET BY MOUTH EVERY DAY   furosemide 20 MG tablet Commonly known as:  LASIX Take 1 tablet (20 mg total) by mouth 2 (two) times daily. What changed:  when to take this   ketoconazole 2 % shampoo Commonly known as:  NIZORAL Apply 1 application topically 2 (two) times a week.   lactulose 10 GM/15ML solution Commonly known as:  CHRONULAC TAKE 45 MILLILITERS BY MOUTH TWICE DAILY What changed:  See the new instructions.   multivitamin with minerals Tabs tablet Take 1 tablet by mouth daily. Start taking on:  October 06, 2018   nadolol 20 MG tablet Commonly known as:  CORGARD TAKE 1 TABLET BY MOUTH EVERY DAY What changed:  additional instructions   ondansetron 4 MG tablet Commonly known as:  ZOFRAN Take 1 tablet (4 mg total) by mouth every 6 (six) hours as needed for nausea.   oxyCODONE 5 MG immediate release tablet Commonly known as:  Oxy IR/ROXICODONE Take 1 tablet (5 mg total) by mouth every 4 (four) hours as needed for moderate pain.   pantoprazole 40 MG tablet Commonly known as:  PROTONIX Take 1 tablet (40 mg total) by mouth daily. What changed:  See the new instructions.   spironolactone 50 MG tablet Commonly known as:  ALDACTONE Take 1 tablet (50 mg total) by mouth 2 (two) times daily.   thiamine 100 MG tablet Take 1 tablet (100 mg total) by mouth daily.   triamcinolone 0.025 % ointment Commonly known as:  KENALOG Apply 1 application topically 2 (two) times daily as needed (rash).      Follow-up Information    Schedule an appointment as soon as possible for a visit  with [name], MD.   Specialty:  Family Medicine Contact information: [place] [address] [phone]        Home hospice Follow up.   Why:  At earliest convenience       Ostergard, Joyice Faster, MD. Schedule an appointment  as soon as possible for a visit in 2 week(s).   Specialty:  Neurosurgery Contact information: [place] [address] [phone]          No Known Allergies  Consultations:  Neurosurgery/ID/palliative care   Procedures/Studies: Mr Lumbar Spine W Wo Contrast  Result Date: 09/20/2018 CLINICAL DATA:  Initial evaluation for persistent low back pain for several weeks. EXAM: MRI LUMBAR SPINE WITHOUT AND WITH CONTRAST TECHNIQUE: Multiplanar and multiecho pulse sequences of the lumbar spine were obtained without and with intravenous contrast. CONTRAST:  60 cc of Gadavist. COMPARISON:  None available. FINDINGS: Segmentation: Standard. Lowest well-formed disc labeled the L5-S1 level. Alignment: Vertebral bodies normally aligned with preservation of the normal lumbar lordosis. No listhesis or subluxation. Vertebrae: Vertebral body heights maintained without evidence for acute or chronic fracture. Bone marrow signal intensity diffusely heterogeneous without discrete or worrisome osseous lesions. Reactive endplate changes present about the L3-4, L4-5, and L5-S1 interspaces. Fluid density present within the right aspect of the L4-5 interspace without significant endplate irregularity or enhancement, favored to be degenerative. Mild edema and enhancement about the L5-S1 interspace also favored to be degenerative. There is abnormal edema and enhancement about the L5-S1 facets, most notable  on the right. Associated small bilateral joint effusions. While these findings could be degenerative in nature, possible septic arthritis should be considered. Possible involvement at L4-5 on the right as well. Conus medullaris and cauda equina: Conus extends to the L2 level. Conus medullaris within normal limits. There is abnormal epidural enhancement involving the lower lumbar spinal canal, primarily extending from L3 through the sacrum. Associated epidural collection within the ventral epidural space most  likely reflects abscess and/or phlegmon. This measures approximately 0.6 x 1.9 x 4.4 cm in greatest dimensions (AP by transverse by craniocaudad), most prominent at the L4 and L5 levels (series 14, image 8). Additional component seen posteriorly within the dorsal epidural space, most notable at L5-S1 on the right (series 15, image 23). Probable associated subdural and/or intradural effusion with mild displacement of the distal nerve roots of the cauda equina at the level of L2-3 (series 8, image 12). Paraspinal and other soft tissues: Mild soft tissue edema and enhancement adjacent to the right L4-5 and L5-S1 facets. Similarly, edema and enhancement present within the right psoas muscle extending from L3-4 through L5-S1, consistent with infection. No discrete soft tissue abscess or drainable fluid collection. Disc levels: L1-2: Negative interspace. Mild bilateral facet hypertrophy. No canal or foraminal stenosis. L2-3: Negative interspace. Mild bilateral facet and ligament flavum hypertrophy. Mild concentric epidural phlegmon/abscess with probable subdural effusion. Nerve roots of the cauda equina mildly compressed (series 8, image 12). Thecal sac remains patent. No foraminal stenosis. L3-4: Chronic intervertebral disc space narrowing with diffuse disc bulge and disc desiccation. Mild reactive endplate changes. Moderate facet and ligament flavum hypertrophy. Concentric epidural phlegmon/abscess. Resultant moderate spinal stenosis. Mild bilateral L3 foraminal narrowing. L4-5: Chronic intervertebral disc space narrowing with diffuse disc bulge and disc desiccation. Mild reactive endplate changes. Moderate facet and ligament flavum hypertrophy. Associated bilateral joint effusions. Epidural phlegmon/abscess, most notable within the ventral epidural space at the level of the left lateral recesses at the level of L4 and L5 (series 8, images 21, 25). Resultant severe canal with left worse than right lateral recess  stenosis. Moderate bilateral L4 foraminal narrowing, right worse than left. L5-S1: Chronic intervertebral disc space narrowing with disc desiccation and diffuse disc bulge. Chronic reactive endplate changes with marginal endplate osteophytic spurring. Moderate to advanced right worse than left facet degeneration. Bilateral joint effusions, with concern for possible septic arthritis, right greater than left. Epidural phlegmon/abscess, most notable at the left lateral recess and right dorsal epidural space. Moderate to severe canal and bilateral foraminal stenosis. IMPRESSION: 1. Abnormal enhancement throughout the epidural space extending from approximately L3 through the sacrum, concerning for infection. Superimposed epidural phlegmon/abscess as above, most notable within the ventral epidural space at the levels of L4 and L5. 2. Abnormal marrow edema and enhancement about the right greater than left L5-S1 facets, suggesting septic arthritis. Finding could be source of infection. 3. Associated moderate to severe spinal stenosis at L3-4 through L5-S1, with moderate to advanced bilateral L4 and L5 foraminal stenosis as above. 4. Associated lower right paraspinous soft tissue edema and enhancement without discrete abscess or drainable fluid collection. Electronically Signed   By: Jeannine Boga M.D.   On: 09/20/2018 06:58   US Renal  Result Date: 09/21/2018 CLINICAL DATA:  Acute renal failure EXAM: RENAL / URINARY TRACT ULTRASOUND COMPLETE COMPARISON:  None. FINDINGS: Right Kidney: Renal measurements: 10.7 x 4.4 x 4.6 cm = volume: 113 mL . Echogenicity within normal limits. No mass or hydronephrosis visualized. Left Kidney: Renal measurements: 10.2 x  4.3 x 4.3 cm = volume: 98 mL. Echogenicity within normal limits. No mass or hydronephrosis visualized. There is a cyst at the lower pole measuring 4.3 x 2.8 x 2.9 cm. Bladder: Appears normal for degree of bladder distention. Other: Small volume abdominal ascites  and incompletely visualized bilateral pleural effusions. IMPRESSION: 1. Normal appearance of the kidneys. 2. Bilateral pleural effusions and small volume abdominal ascites. Electronically Signed   By: Ulyses Jarred M.D.   On: 09/21/2018 17:05   Dg Chest Port 1 View  Result Date: 10/01/2018 CLINICAL DATA:  Dyspnea. EXAM: PORTABLE CHEST 1 VIEW COMPARISON:  Radiograph of Sep 23, 2018. FINDINGS: Stable cardiomediastinal silhouette. Interval placement of right-sided PICC line with distal tip in expected position of the SVC. No pneumothorax or significant pleural effusion is noted. Mild bibasilar subsegmental atelectasis or infiltrates are noted. The visualized skeletal structures are unremarkable. IMPRESSION: Interval placement of right-sided PICC line with distal tip in expected position of the SVC. Mild bibasilar subsegmental atelectasis or infiltrate. Electronically Signed   By: Marijo Conception M.D.   On: 10/01/2018 10:03   Dg Chest Port 1 View  Result Date: 09/23/2018 CLINICAL DATA:  Tachypnea EXAM: PORTABLE CHEST 1 VIEW COMPARISON:  05/18/2018 FINDINGS: There are multifocal airspace opacities greatest at the right lung base. The heart size is mildly enlarged. There is no pneumothorax or large pleural effusion. There is some mild elevation of the right hemidiaphragm. There is no acute osseous abnormality. IMPRESSION: Multifocal airspace opacities, primarily at the lung bases, concerning for multifocal pneumonia or aspiration. Electronically Signed   By: Constance Holster M.D.   On: 09/23/2018 21:44   Dg Abd Portable 1v  Result Date: 10/01/2018 CLINICAL DATA:  Persistent vomiting EXAM: PORTABLE ABDOMEN - 1 VIEW COMPARISON:  06/17/2017 abdominal CT FINDINGS: Moderate gaseous distention of the stomach. Overall nonobstructive bowel gas pattern. No concerning mass effect or calcification. Elevated right diaphragm also seen on prior. IMPRESSION: Moderate gaseous distention of the stomach. Electronically Signed    By: Monte Fantasia M.D.   On: 10/01/2018 04:38   Dg Swallowing Func-speech Pathology  Result Date: 09/27/2018 Objective Swallowing Evaluation: Type of Study: MBS-Modified Barium Swallow Study  Patient Details Name: Quaron Delacruz MRN: [id number] Date of Birth: [birth year] Today's Date: 09/27/2018 Time: SLP Start Time (ACUTE ONLY): 0831 -SLP Stop Time (ACUTE ONLY): 0855 SLP Time Calculation (min) (ACUTE ONLY): 24 min Past Medical History: Past Medical History: Diagnosis Date . Alcohol abuse  . Anemia  . Arthritis  . Ascites  . Cirrhosis ([place])  . Coffee ground emesis  . Dehydration 06/17/2017 . Febrile illness  . Heart murmur  . Hyperlipidemia  . Hypertension  . Leg swelling  . Myocardial infarction ([place]) 2012 . Preop cardiovascular exam 04/14/2013 . Sepsis ([place]) 06/17/2017 . Septic shock ([place]) 06/18/2017 . SIRS (systemic inflammatory response syndrome) ([place]) 07/11/2017 . Stroke [facility]) 2012  no deficits . Thrombocytopenia ([place])  Past Surgical History: Past Surgical History: Procedure Laterality Date . COLONOSCOPY WITH PROPOFOL N/A 02/07/2016  Procedure: COLONOSCOPY WITH PROPOFOL;  Surgeon: Milus Banister, MD;  Location: [facility] ENDOSCOPY;  Service: Endoscopy;  Laterality: N/A; . ESOPHAGOGASTRODUODENOSCOPY (EGD) WITH PROPOFOL N/A 02/07/2016  Procedure: ESOPHAGOGASTRODUODENOSCOPY (EGD) WITH PROPOFOL;  Surgeon: Milus Banister, MD;  Location: [facility] ENDOSCOPY;  Service: Endoscopy;  Laterality: N/A; . ESOPHAGOGASTRODUODENOSCOPY (EGD) WITH PROPOFOL N/A 06/22/2017  Procedure: ESOPHAGOGASTRODUODENOSCOPY (EGD) WITH PROPOFOL;  Surgeon: Jerene Bears, MD;  Location: [facility] ENDOSCOPY;  Service: Gastroenterology;  Laterality: N/A; . HERNIA REPAIR Right   inguinal .  KNEE ARTHROSCOPY    bilateral/  12/14 . TEE WITHOUT CARDIOVERSION N/A 05/13/2018  Procedure: TRANSESOPHAGEAL ECHOCARDIOGRAM (TEE);  Surgeon: [name], MD;  Location: [facility] ENDOSCOPY;  Service: Cardiovascular;  Laterality: N/A; . TOTAL KNEE ARTHROPLASTY Right 05/02/2013   Procedure: RIGHT TOTAL KNEE ARTHROPLASTY;  Surgeon: Mauri Pole, MD;  Location: [facility] ORS;  Service: Orthopedics;  Laterality: Right; HPI: 63 yo male adm [facility] with h/o polysubstance abuse and ETOH/cirrohisis with lumbar spine abscess and MRSA bacteremia.  Neuro surgery and ID following.  Pt with possible/probable aspiration episode and SLP swallow eval was ordered.  CXR showed lower lobe infiltrates concerning for aspiration.  Pt also had episode of vomiting bile type material.   Subjective: pt awake in chair Assessment / Plan / Recommendation CHL IP CLINICAL IMPRESSIONS 09/27/2018 Clinical Impression Patient presents with mild oral dysphagia characterized by decreased oral coordination and premature spillage.  Pharyngeal swallow is strong but did allow laryngeal penetration of thin x3 - once to cords (silent) with tsp amount and twice into laryngeal vestibule due to decreased timing of laryngeal closure.  Penetrates cleared with further swallows and pt was adequately protecting airway with sequential boluses of thin.  Pharyngeal swallow is strong without residuals.  Recommend Dys3/thin with precautions.  Pt needs to be fully alert and sitting upright for all po intake.  Esophageal sweep did not reveal any gross abnormalities but this test is not designed to diagnosis in esophagus. Of note, pt became frustrated at end of test because he wanted a beer and became agitated (cussing) after informed repeatedly that staff could not obtain one for him. He perseverated significantly and could not be redirected.   SLP Visit Diagnosis -- Attention and concentration deficit following -- Frontal lobe and executive function deficit following -- Impact on safety and function Mild aspiration risk   CHL IP TREATMENT RECOMMENDATION 09/27/2018 Treatment Recommendations Therapy as outlined in treatment plan below   Prognosis 09/27/2018 Prognosis for Safe Diet Advancement Fair Barriers to Reach Goals Cognitive deficits Barriers/Prognosis  Comment -- CHL IP DIET RECOMMENDATION 09/27/2018 SLP Diet Recommendations Dysphagia 3 (Mech soft) solids;Thin liquid Liquid Administration via Cup;Straw Medication Administration Whole meds with puree Compensations Slow rate;Small sips/bites Postural Changes Seated upright at 90 degrees   CHL IP OTHER RECOMMENDATIONS 09/27/2018 Recommended Consults -- Oral Care Recommendations Oral care BID Other Recommendations --   CHL IP FOLLOW UP RECOMMENDATIONS 09/26/2018 Follow up Recommendations Other (comment)   CHL IP FREQUENCY AND DURATION 09/27/2018 Speech Therapy Frequency (ACUTE ONLY) min 1 x/week Treatment Duration 1 week      CHL IP ORAL PHASE 09/27/2018 Oral Phase Impaired Oral - Pudding Teaspoon -- Oral - Pudding Cup -- Oral - Honey Teaspoon -- Oral - Honey Cup -- Oral - Nectar Teaspoon -- Oral - Nectar Cup WFL Oral - Nectar Straw -- Oral - Thin Teaspoon -- Oral - Thin Cup Premature spillage Oral - Thin Straw WFL Oral - Puree WFL Oral - Mech Soft -- Oral - Regular WFL Oral - Multi-Consistency -- Oral - Pill Other (Comment) Oral Phase - Comment --  CHL IP PHARYNGEAL PHASE 09/27/2018 Pharyngeal Phase Impaired Pharyngeal- Pudding Teaspoon -- Pharyngeal -- Pharyngeal- Pudding Cup -- Pharyngeal -- Pharyngeal- Honey Teaspoon -- Pharyngeal -- Pharyngeal- Honey Cup -- Pharyngeal -- Pharyngeal- Nectar Teaspoon -- Pharyngeal -- Pharyngeal- Nectar Cup WFL Pharyngeal -- Pharyngeal- Nectar Straw WFL Pharyngeal -- Pharyngeal- Thin Teaspoon -- Pharyngeal -- Pharyngeal- Thin Cup Penetration/Aspiration during swallow Pharyngeal Material enters airway, CONTACTS cords and not ejected  out;Material enters airway, remains ABOVE vocal cords then ejected out Pharyngeal- Thin Straw WFL Pharyngeal -- Pharyngeal- Puree -- Pharyngeal -- Pharyngeal- Mechanical Soft -- Pharyngeal -- Pharyngeal- Regular WFL Pharyngeal -- Pharyngeal- Multi-consistency -- Pharyngeal -- Pharyngeal- Pill -- Pharyngeal -- Pharyngeal Comment --  CHL IP CERVICAL ESOPHAGEAL PHASE  09/27/2018 Cervical Esophageal Phase WFL Pudding Teaspoon -- Pudding Cup -- Honey Teaspoon -- Honey Cup -- Nectar Teaspoon -- Nectar Cup -- Nectar Straw -- Thin Teaspoon -- Thin Cup -- Thin Straw -- Puree -- Mechanical Soft -- Regular -- Multi-consistency -- Pill -- Cervical Esophageal Comment -- Macario Golds 09/27/2018, 9:21 AM  Luanna Salk, MS [facility] SLP Acute Rehab Services Pager [phone] Office [phone]             Ir Picc Placement Right >5 Yrs Inc Img Guide  Result Date: 09/27/2018 INDICATION: Bacteremia, access for antibiotics EXAM: ULTRASOUND AND FLUOROSCOPIC GUIDED PICC LINE INSERTION MEDICATIONS: 1% lidocaine local CONTRAST:  None FLUOROSCOPY TIME:  5.0 minutes (27 mGy) COMPLICATIONS: None immediate. TECHNIQUE: The procedure, risks, benefits, and alternatives were explained to the patient's family and informed written consent was obtained. A timeout was performed prior to the initiation of the procedure. The right upper extremity was prepped with chlorhexidine in a sterile fashion, and a sterile drape was applied covering the operative field. Maximum barrier sterile technique with sterile gowns and gloves were used for the procedure. A timeout was performed prior to the initiation of the procedure. Local anesthesia was provided with 1% lidocaine. Under direct ultrasound guidance, the right brachial vein was accessed with a micropuncture kit after the overlying soft tissues were anesthetized with 1% lidocaine. An ultrasound image was saved for documentation purposes. A guidewire was eventually advanced to the level of the superior caval-atrial junction for measurement purposes and the PICC line was cut to length. A peel-away sheath was placed and a 28 cm, 5 [place], dual lumen was inserted to level of the superior caval-atrial junction. A post procedure spot fluoroscopic was obtained. The catheter easily aspirated and flushed and was sutured in place. A dressing was placed. The patient  tolerated the procedure well without immediate post procedural complication. FINDINGS: After catheter placement, the tip lies within the superior cavoatrial junction. The catheter aspirates and flushes normally and is ready for immediate use. IMPRESSION: Successful ultrasound and fluoroscopic guided placement of a right brachial vein approach, 28 cm, 5 French, dual lumen PICC with tip at the superior caval-atrial junction. The PICC line is ready for immediate use. Electronically Signed   By: Jerilynn Mages.  Shick M.D.   On: 09/27/2018 15:53   [place] Ekg Site Rite  Result Date: 09/25/2018 If Site Rite image not attached, placement could not be confirmed due to current cardiac rhythm.   2D echo: IMPRESSIONS 1. The left ventricle has normal systolic function with an ejection fraction of 60-65%. The cavity size was normal. There is mildly increased left ventricular wall thickness. Left ventricular diastolic parameters were normal. 2. The right ventricle has normal systolic function. The cavity was normal. There is no increase in right ventricular wall thickness. 3. Left atrial size was severely dilated. 4. No evidence of mitral valve stenosis. 5. Mild thickening of the aortic valve. Mild calcification of the aortic valve. No stenosis of the aortic valve    Subjective: Patient seen and examined at bedside.  He is awake and almost oriented x3.  He does note stated the month of the seizure apparently.  States that he wants to go home  repeatedly.  No overnight fever or vomiting reported  Discharge Exam: Vitals:   10/04/18 2128 10/05/18 0641  BP: (!) 151/57 (!) 146/61  Pulse: 76 78  Resp:    Temp: 99.3 F (37.4 C) 100 F (37.8 C)  SpO2: 100% 98%     General exam: Looks chronically ill.  Looks older than stated age.  Awake and almost oriented x3.  Poor historian.  Keeps stating that he wants to go home.  Respiratory system: Bilateral decreased breath sounds at bases with some scattered crackles mostly  in the bases, no wheezing Cardiovascular system: Rate controlled, S1-S2 heard gastrointestinal system: Abdomen is distended, soft and nontender. Normal bowel sounds heard. Extremities: No cyanosis.  Trace edema.       The results of significant diagnostics from this hospitalization (including imaging, microbiology, ancillary and laboratory) are listed below for reference.     Microbiology: Recent Results (from the past 240 hour(s))  Culture, blood (routine x 2)     Status: None (Preliminary result)   Collection Time: 10/01/18  8:15 AM  Result Value Ref Range Status   Specimen Description BLOOD RIGHT ANTECUBITAL  Final   Special Requests   Final    BOTTLES DRAWN AEROBIC ONLY Blood Culture adequate volume   Culture   Final    NO GROWTH 4 DAYS Performed at [facility], [address], [place], [place] [address]    Report Status PENDING  Incomplete     Labs: BNP (last 3 results) No results for input(s): BNP in the last 8760 hours. Basic Metabolic Panel: Recent Labs  Lab 10/01/18 0400 10/02/18 0454 10/03/18 0324 10/04/18 0340 10/05/18 0241  NA 142 144 141 139 138  K 3.3* 3.3* 3.3* 3.7 3.4*  CL 112* 115* 112* 110 105  CO2 '23 24 25 25 27  ' GLUCOSE 125* 102* 83 106* 115*  BUN '15 18 11 11 10  ' CREATININE 0.81 0.81 0.79 0.77 0.78  CALCIUM 7.9* 8.0* 7.6* 7.7* 7.8*  MG 2.1 1.8 1.4* 1.6* 1.6*   Liver Function Tests: Recent Labs  Lab 09/30/18 0443 10/02/18 0454 10/03/18 0324 10/04/18 0340 10/05/18 0241  AST 104* 129* 180* 130* 86*  ALT 38 53* 75* 71* 56*  ALKPHOS 117 108 123 139* 117  BILITOT 0.8 0.9 0.8 1.1 0.9  PROT 4.9* 5.5* 5.0* 5.5* 5.3*  ALBUMIN 1.3* 1.5* 1.4* 1.4* 1.3*   No results for input(s): LIPASE, AMYLASE in the last 168 hours. Recent Labs  Lab 09/30/18 0443 10/02/18 0454 10/03/18 0324  AMMONIA 37* 26 30   CBC: Recent Labs  Lab 10/01/18 0400 10/02/18 0454 10/03/18 0324 10/03/18 1820 10/04/18 0340 10/05/18 0241  WBC 21.5* 16.2* 10.0   --  8.5 6.8  NEUTROABS 19.6* 13.4* 7.5  --  6.4 4.7  HGB 7.5* 6.3* 6.9* 8.4* 8.3* 8.2*  HCT 23.0* 19.7* 21.4* 26.0* 26.0* 25.0*  MCV 101.3* 102.1* 96.4  --  95.2 94.7  PLT 194 160 141*  --  152 138*   Cardiac Enzymes: Recent Labs  Lab 09/29/18 0442 10/02/18 0454  CKTOTAL 493* 500*   BNP: Invalid input(s): POCBNP CBG: Recent Labs  Lab 10/04/18 1631 10/04/18 2124 10/05/18 0008 10/05/18 0636 10/05/18 0754  GLUCAP 98 79 125* 90 104*   D-Dimer No results for input(s): DDIMER in the last 72 hours. Hgb A1c No results for input(s): HGBA1C in the last 72 hours. Lipid Profile No results for input(s): CHOL, HDL, LDLCALC, TRIG, CHOLHDL, LDLDIRECT in the last 72 hours. Thyroid  function studies No results for input(s): TSH, T4TOTAL, T3FREE, THYROIDAB in the last 72 hours.  Invalid input(s): FREET3 Anemia work up No results for input(s): VITAMINB12, FOLATE, FERRITIN, TIBC, IRON, RETICCTPCT in the last 72 hours. Urinalysis    Component Value Date/Time   COLORURINE YELLOW 09/21/2018 1550   APPEARANCEUR HAZY (A) 09/21/2018 1550   LABSPEC 1.014 09/21/2018 1550   PHURINE 5.0 09/21/2018 1550   GLUCOSEU NEGATIVE 09/21/2018 1550   HGBUR LARGE (A) 09/21/2018 1550   BILIRUBINUR NEGATIVE 09/21/2018 1550   KETONESUR NEGATIVE 09/21/2018 1550   PROTEINUR NEGATIVE 09/21/2018 1550   UROBILINOGEN 2.0 (H) 04/26/2013 0945   NITRITE NEGATIVE 09/21/2018 1550   LEUKOCYTESUR NEGATIVE 09/21/2018 1550   Sepsis Labs Invalid input(s): PROCALCITONIN,  WBC,  LACTICIDVEN Microbiology Recent Results (from the past 240 hour(s))  Culture, blood (routine x 2)     Status: None (Preliminary result)   Collection Time: 10/01/18  8:15 AM  Result Value Ref Range Status   Specimen Description BLOOD RIGHT ANTECUBITAL  Final   Special Requests   Final    BOTTLES DRAWN AEROBIC ONLY Blood Culture adequate volume   Culture   Final    NO GROWTH 4 DAYS Performed at [facility], [address]  [place], [place] [address]    Report Status PENDING  Incomplete     Time coordinating discharge: 35 minutes  SIGNED:   Aline August, MD  [facility] 10/05/2018, 12:00 PM

## 2018-10-05 NOTE — TOC Transition Note (Addendum)
Transition of Care [facility]) - CM/SW Discharge Note   Patient Details  Name: Mark [name] MRN: [id number] Date of Birth: 1956/03/19  [facility]) CM/SW Contact:  Benard Halsted, LCSW Phone Number: 10/05/2018, 3:19 PM   Clinical Narrative:    Patient to discharge home with hospice. PTAR called.    Final next level of care: Home w Hospice Care Barriers to Discharge: No Barriers Identified   Patient Goals and CMS Choice   CMS Medicare.gov Compare Post Acute Care list provided to:: Patient Choice offered to / list presented to : Patient  Discharge Placement                  Name of family member notified: Eben Burow, daughter Patient and family notified of of transfer: 10/05/18  Discharge Plan and Services                DME Arranged: N/A DME Agency: NA       HH Arranged: NA, Refused SNF [place] Agency: NA        Social Determinants of Health ([place]) Interventions     Readmission Risk Interventions Readmission Risk Prevention Plan 10/03/2018  Transportation Screening Complete  Medication Review Press photographer) Complete  PCP or Specialist appointment within 3-5 days of discharge Complete  HRI or [place] Not Complete  HRI or Home Care Consult Pt Refusal Comments Pt stated he wanted to go home.  Did not discuss discharging with Home Health services.  SW Recovery Care/Counseling Consult Complete  Palliative Care Screening Not [place] Patient Refused  Some recent data might be hidden

## 2018-10-05 NOTE — Progress Notes (Signed)
CSW, MD, and RN spoke with patient, wife, daughter, and sister in law. Patient is adamantly refusing SNF and wants to return home. He and family are agreeable to Hospice at home. CSW sent referral to [employer] for review. Family denied need for equipment.   Percell Locus Mustaf Antonacci LCSW [phone]

## 2018-10-05 NOTE — Telephone Encounter (Signed)
Copied from [place] [id number]. Topic: Quick Communication - See Telephone Encounter >> Oct 05, 2018  2:06 PM Berneta Levins wrote: CRM for notification. See Telephone encounter for: 10/05/18.  Jenn with [facility] calling.  States pt is being discharged from [facility] and she would like to know if Dr. Volanda Napoleon will be attending of record once he leaves the hospital. Danise Mina can be reached at [phone]

## 2018-10-05 NOTE — Progress Notes (Signed)
Pt refusing all medications throughout the day. MD aware. RN to continue to monitor.

## 2018-10-05 NOTE — Progress Notes (Signed)
Manufacturing engineer [facility])  Received request from [place] for home hospice services. Chart reviewed and eligibility has been confirmed by [facility] physician.   Spoke with patient by phone to confirm interest. No DME requested at this time.   [facility] referral center specialist to contact patient/family to schedule first visit.   Thank you,  Erling Conte, LCSW [phone]

## 2018-10-06 LAB — CULTURE, BLOOD (ROUTINE X 2)
Culture: NO GROWTH
Special Requests: ADEQUATE

## 2018-10-06 NOTE — Telephone Encounter (Signed)
Jenn called back to see the status of the previous request below/ please advise / Pt family is wanting the care visit asap

## 2018-10-06 NOTE — Telephone Encounter (Signed)
Mark [name] called to f/u on order request. She only has one appt left for today at 5pm. It is first come first serve. She is wanting to get pt scheduled for tonight but needs VO for Dr. Volanda Napoleon to be attending of record before she can go out. Please call.

## 2018-10-06 NOTE — Telephone Encounter (Signed)
Please Advise

## 2018-10-06 NOTE — Telephone Encounter (Signed)
Jen with Hospice called again waiting for a response from the doctor.

## 2018-10-07 NOTE — Telephone Encounter (Signed)
Spoke with Mark [name] with Hospice verbalized understanding that [name] approves the VO for pt attending of records.

## 2018-10-15 ENCOUNTER — Telehealth: Payer: Self-pay | Admitting: Family Medicine

## 2018-10-15 NOTE — Telephone Encounter (Unsigned)
Copied from [place] [id number]. Topic: Appointment Scheduling - Scheduling Inquiry for Clinic >> Oct 15, 2018 12:59 PM Yvette Rack wrote: Reason for CRM: Pt wife called to schedule a hospital follow up appt. Attempted to transfer pt wife to office but we were placed on hold. After holding for a while the call was disconnected. Please contact pt wife to schedule appt.

## 2018-10-15 NOTE — Telephone Encounter (Signed)
15 day supply sent in for pt from [facility]. Pt will need further refills after 15 days.

## 2018-10-15 NOTE — Telephone Encounter (Signed)
Helene Kelp with [facility] requests refill  Medication:   oxyCODONE (OXY IR/ROXICODONE) 5 MG immediate release tablet   Has the patient contacted their pharmacy? no  Preferred Pharmacy (with phone number or street name):  [facility] [phone] - [place], [address] AT [address] [place] [phone] (Phone) [phone] (Fax)    Agent: Please be advised that RX refills may take up to 3 business days. We ask that you follow-up with your pharmacy.

## 2018-10-19 NOTE — Telephone Encounter (Signed)
Called left detailed VM for Mark [name] with [facility] regarding Medication requested. Advised to call the office

## 2018-10-25 NOTE — Telephone Encounter (Signed)
Pt has an office visit appointment

## 2018-10-26 ENCOUNTER — Telehealth: Payer: Self-pay

## 2018-10-26 NOTE — Telephone Encounter (Signed)
Copied from [place] [id number]. Topic: Quick Communication - Rx Refill/Question >> Oct 26, 2018  4:12 PM [place] wrote: CRM for notification. See Telephone encounter for: 10/26/18.[facility] /  hospice Helene Kelp [place] requesting refill  has worked extremely well...ketoconazole (NIZORAL) 2 % shampoo  [facility] [place], [address] [place] [phone] (Phone) [phone] (Fax)

## 2018-10-27 ENCOUNTER — Other Ambulatory Visit: Payer: Self-pay

## 2018-10-27 MED ORDER — KETOCONAZOLE 2 % EX SHAM: 1.0000 "application " | MEDICATED_SHAMPOO | CUTANEOUS | 0 refills | Status: DC

## 2018-10-27 NOTE — Telephone Encounter (Signed)
Rx sent 

## 2018-11-02 ENCOUNTER — Telehealth: Payer: Self-pay | Admitting: Family Medicine

## 2018-11-02 NOTE — Telephone Encounter (Signed)
Error not needed

## 2018-11-03 ENCOUNTER — Other Ambulatory Visit: Payer: Self-pay | Admitting: Family Medicine

## 2018-11-03 ENCOUNTER — Ambulatory Visit: Payer: Medicare Other | Admitting: Family Medicine

## 2018-11-03 ENCOUNTER — Encounter: Payer: Self-pay | Admitting: Family Medicine

## 2018-11-03 ENCOUNTER — Other Ambulatory Visit: Payer: Self-pay

## 2018-11-03 VITALS — BP 94/60 | HR 82 | Temp 97.8°F

## 2018-11-03 DIAGNOSIS — R5381 Other malaise: Secondary | ICD-10-CM | POA: Diagnosis not present

## 2018-11-03 DIAGNOSIS — K7030 Alcoholic cirrhosis of liver without ascites: Secondary | ICD-10-CM

## 2018-11-03 DIAGNOSIS — Z515 Encounter for palliative care: Secondary | ICD-10-CM

## 2018-11-03 NOTE — Progress Notes (Signed)
Subjective:    Patient ID: Mark [name], male    DOB: October 17, 1955, 63 y.o.   MRN: [id number]  No chief complaint on file. Patient is accompanied by his wife, [name].  HPI Patient was seen today for follow-up.  Pt states he is feeling good.  Notes continued weakness since discharge from hospital.  Using a wheelchair to get around.  Pt states mostly on the couch and transfers to bedside commode.  Pt states nurse comes by weekly.  Pt taking meds including lactulose.  Pt states he is no longer drinking.  Pt does mention he has been drinking nonalcoholic beer with 6.60% alcohol content.  States likes the taste.  May have 2-3 per day.  Pt has not had f/u with ID s/p hospitalization 6/30-05/04/99 for alcoholic cirrhosis with hepatic encephalopathy, MRSA bacteremia 2/2 epidural abscess of lumbar spine.  Pt was given vanc and zosyn, then switched to daptomycin per ID recs.  Abscess was not large enough to drain per IR.  Pt was high risk for surgery per Neurosurg. Pt remained in hospital for IV abx via PICC line given concern for possible misuse.  Pt also had aspiration pna, treated with unasyn and augmentin.  Pt was subsequently d/c home on 4 wks of doxycycline and hospice.     Past Medical History:  Diagnosis Date  . Alcohol abuse   . Anemia   . Arthritis   . Ascites   . Cirrhosis ([place])   . Coffee ground emesis   . Dehydration 06/17/2017  . Febrile illness   . Heart murmur   . Hyperlipidemia   . Hypertension   . Leg swelling   . Myocardial infarction ([place]) 2012  . Preop cardiovascular exam 04/14/2013  . Sepsis ([place]) 06/17/2017  . Septic shock ([place]) 06/18/2017  . SIRS (systemic inflammatory response syndrome) ([place]) 07/11/2017  . Stroke [facility]) 2012   no deficits  . Thrombocytopenia ([name])     No Known Allergies  ROS General: Denies fever, chills, night sweats, changes in weight, changes in appetite HEENT: Denies headaches, ear pain, changes in vision, rhinorrhea, sore throat CV: Denies  CP, palpitations, SOB, orthopnea Pulm: Denies SOB, cough, wheezing GI: Denies abdominal pain, nausea, vomiting, diarrhea, constipation GU: Denies dysuria, hematuria, frequency, vaginal discharge Msk: Denies muscle cramps, joint pains  +decreased strength Neuro: Denies weakness, numbness, tingling Skin: Denies rashes, bruising Psych: Denies depression, anxiety, hallucinations    Objective:    Blood pressure 94/60, pulse 82, temperature 97.8 F (36.6 C), temperature source Oral, SpO2 98 %.  Gen. Pleasant, well-nourished, in no distress, normal affect.  Sitting in a transport wheelchair. HEENT: [place]/AT, face symmetric, conjunctiva clear, no scleral icterus, PERRLA, nares patent without drainage Lungs: no accessory muscle use, CTAB, no wheezes or rales Cardiovascular: RRR, no m/r/g, no peripheral edema Abdomen: BS present, soft, NT/ND Musculoskeletal: No deformities, no cyanosis or clubbing, normal tone Neuro:  A&Ox3, CN II-XII intact, normal gait Skin:  Warm, no lesions/ rash   Wt Readings from Last 3 Encounters:  09/20/18 142 lb 10.2 oz (64.7 kg)  08/22/18 140 lb (63.5 kg)  06/03/18 126 lb 8.7 oz (57.4 kg)    Lab Results  Component Value Date   WBC 6.8 10/05/2018   HGB 8.2 (L) 10/05/2018   HCT 25.0 (L) 10/05/2018   PLT 138 (L) 10/05/2018   GLUCOSE 115 (H) 10/05/2018   CHOL 84 12/01/2013   TRIG 58 12/01/2013   HDL 26 (L) 12/01/2013   LDLCALC 46 12/01/2013   ALT  56 (H) 10/05/2018   AST 86 (H) 10/05/2018   NA 138 10/05/2018   K 3.4 (L) 10/05/2018   CL 105 10/05/2018   CREATININE 0.78 10/05/2018   BUN 10 10/05/2018   CO2 27 10/05/2018   TSH 1.809 07/11/2017   INR 1.47 05/10/2018   HGBA1C  11/01/2009    5.1 (NOTE)                                                                       According to the ADA Clinical Practice Recommendations for 2011, when HbA1c is used as a screening test:   >=6.5%   Diagnostic of Diabetes Mellitus           (if abnormal result  is confirmed)   5.7-6.4%   Increased risk of developing Diabetes Mellitus  References:Diagnosis and Classification of Diabetes Mellitus,Diabetes HALP,3790,24(OXBDZ 1):S62-S69 and Standards of Medical Care in         Diabetes - 2011,Diabetes Care,2011,34  (Suppl 1):S11-S61.    Assessment/Plan:  Alcoholic cirrhosis of liver without ascites ([place]) -prognosis poor.  On hospice -chronic thrombocytopenia -continued alcohol cessation encouraged -continue current meds: lactulose BID, lasix 20 mg, nadolol 20 mg, spironolactone 50 mg thiamine, protonix, folic acid   Physical deconditioning  -discussed chair exercises. -pt declines SNF - Plan: Ambulatory referral to [place] care patient  -continue current plan   F/u prn  Grier Mitts, MD

## 2018-11-05 ENCOUNTER — Telehealth: Payer: Self-pay | Admitting: Family Medicine

## 2018-11-05 ENCOUNTER — Encounter: Payer: Self-pay | Admitting: Family Medicine

## 2018-11-05 NOTE — Telephone Encounter (Signed)
Informed patient since ID doc at hospital sent him on medication to call ID clinic. Provided patient the number and informed him that his PCP is out of the office today.

## 2018-11-05 NOTE — Telephone Encounter (Signed)
General/Other - Hospice  When he came home from Hospital they gave him doxycycline (VIBRA-TABS) 100 MG tablet [[id number]] It is for the abscess in back. He has 1 pill left with no refills left. The hospice nurse wanted to know if the patient is supposed to stop taking this.  Please call the patient as soon as possible. [phone] She does not want him to get an infection [phone].

## 2018-11-05 NOTE — Telephone Encounter (Signed)
Pt was supposed to complete a 4 wk course of doxycycline and  f/u with ID after d/c from hospital.

## 2018-11-08 NOTE — Telephone Encounter (Signed)
Please see previous response.  Pt was given a 4 wk course of doxycycline after d/c from hospital for his infection.  Pt has since completed that course.

## 2018-11-08 NOTE — Telephone Encounter (Signed)
Spoke with patient. Informed patient he received a 4 week supply when he was discharged from hospital. Patient reports he still feels like he is not cured stating "my back still hurts." Advised patient I would let his PCP know but he needs to contact ID clinic. Patient verbalized understanding.

## 2018-11-08 NOTE — Telephone Encounter (Signed)
Patient is requesting more medication. He would like to know can Dr. Volanda Napoleon refill for him?  doxycycline (VIBRA-TABS) 100 MG tablet [[id number]]   Or does he need to go through Hospice Please advise CB- [phone]

## 2018-11-15 ENCOUNTER — Encounter: Payer: Self-pay | Admitting: Internal Medicine

## 2018-11-15 ENCOUNTER — Other Ambulatory Visit: Payer: Self-pay

## 2018-11-15 ENCOUNTER — Ambulatory Visit (INDEPENDENT_AMBULATORY_CARE_PROVIDER_SITE_OTHER): Admitting: Internal Medicine

## 2018-11-15 DIAGNOSIS — G062 Extradural and subdural abscess, unspecified: Secondary | ICD-10-CM

## 2018-11-15 MED ORDER — DOXYCYCLINE HYCLATE 100 MG PO TABS: 100.0000 mg | ORAL_TABLET | Freq: Two times a day (BID) | ORAL | 1 refills | Status: DC

## 2018-11-15 NOTE — Assessment & Plan Note (Signed)
Given that he was not treated with the standard 6 weeks of IV antibiotic therapy I favor restarting oral doxycycline and repeating lab work today.  He will follow-up in 6 weeks.  He is in agreement with that plan.

## 2018-11-15 NOTE — Progress Notes (Signed)
[facility] for Infectious Disease  Patient Active Problem List   Diagnosis Date Noted   MRSA bacteremia 09/21/2018    Priority: High   Epidural abscess 09/20/2018    Priority: High   AKI (acute kidney injury) ([place])    Bacteremia    Spinal stenosis of lumbar region without neurogenic claudication    Aspiration pneumonia ([place]) 09/24/2018   Abscess in epidural space of lumbar spine    Septic arthritis ([place]) 09/20/2018   Anemia of chronic disease 09/20/2018   Hypoalbuminemia 09/20/2018   Hypoglycemia without diagnosis of diabetes mellitus 09/20/2018   Hyperkalemia 05/18/2018   Edentulous 05/11/2018   Pancytopenia ([place]) 05/10/2018   CAD (coronary artery disease) 05/10/2018   Hepatic encephalopathy ([place]) 05/10/2018   Alcohol abuse 05/10/2018   GERD (gastroesophageal reflux disease) 05/10/2018   Duodenal ulcer    Renal failure    Acute on chronic anemia    Hypotension 06/17/2017   Acute on chronic renal failure ([place]) 06/17/2017   Hyponatremia 06/17/2017   Thrombocytopenia ([place]) 06/17/2017   Leg edema, right 86/76/1950   Acute metabolic encephalopathy 93/26/7124   Anemia, iron deficiency    Benign neoplasm of ascending colon    Hemorrhoids    Portal hypertensive gastropathy ([name])    Gastritis and gastroduodenitis    Esophageal varices without bleeding ([place])    H/O: CVA (cerebrovascular accident) 12/01/2013   ACS (acute coronary syndrome) ([place]) 12/01/2013   Anasarca 12/01/2013   Polysubstance abuse ([place]) 12/01/2013   CKD (chronic kidney disease) stage 3, GFR 30-59 ml/min ([place]) 12/01/2013   S/P right TKA 05/02/2013   Murmur 04/14/2013   Right inguinal hernia 12/26/2010   Cirrhosis, alcoholic ([place]) 58/12/9831    Patient's Medications  New Prescriptions   No medications on file  Previous Medications   ALBUTEROL (PROVENTIL HFA;VENTOLIN HFA) 108 (90 BASE) MCG/ACT INHALER    Inhale 2 puffs into the lungs every 6 (six)  hours as needed for wheezing or shortness of breath.   DIPHENHYDRAMINE-ZINC ACETATE (BENADRYL EXTRA STRENGTH) CREAM    Apply 1 application topically 3 (three) times daily as needed for itching.   FOLIC ACID (FOLVITE) 1 MG TABLET    TAKE 1 TABLET BY MOUTH EVERY DAY   FUROSEMIDE (LASIX) 20 MG TABLET    Take 1 tablet (20 mg total) by mouth 2 (two) times daily.   KETOCONAZOLE (NIZORAL) 2 % SHAMPOO    Apply 1 application topically 2 (two) times a week.   LACTULOSE (CHRONULAC) 10 GM/15ML SOLUTION    TAKE 45 MILLILITERS BY MOUTH TWICE DAILY   MULTIPLE VITAMIN (MULTIVITAMIN WITH MINERALS) TABS TABLET    Take 1 tablet by mouth daily.   NADOLOL (CORGARD) 20 MG TABLET    TAKE 1 TABLET BY MOUTH EVERY DAY   ONDANSETRON (ZOFRAN) 4 MG TABLET    Take 1 tablet (4 mg total) by mouth every 6 (six) hours as needed for nausea.   OXYCODONE (OXY IR/ROXICODONE) 5 MG IMMEDIATE RELEASE TABLET    Take 1 tablet (5 mg total) by mouth every 4 (four) hours as needed for moderate pain.   PANTOPRAZOLE (PROTONIX) 40 MG TABLET    Take 1 tablet (40 mg total) by mouth daily.   SPIRONOLACTONE (ALDACTONE) 50 MG TABLET    Take 1 tablet (50 mg total) by mouth 2 (two) times daily.   THIAMINE 100 MG TABLET    Take 1 tablet (100 mg total) by mouth daily.   TRIAMCINOLONE (KENALOG) 0.025 %  OINTMENT    Apply 1 application topically 2 (two) times daily as needed (rash).  Modified Medications   Modified Medication Previous Medication   DOXYCYCLINE (VIBRA-TABS) 100 MG TABLET doxycycline (VIBRA-TABS) 100 MG tablet      Take 1 tablet (100 mg total) by mouth every 12 (twelve) hours.    Take 1 tablet (100 mg total) by mouth every 12 (twelve) hours.  Discontinued Medications   No medications on file    Subjective: Mark [name] is in for a hospital follow-up visit.  He was hospitalized in late May with MRSA bacteremia and acute kidney injury.  He was started on daptomycin.  Repeat blood cultures were negative within 36 hours.  He had no evidence of  endocarditis by exam or TTE.  He has been hospitalized in January of this year with MSSA bacteremia and was treated with 2 weeks of IV cefazolin.  He had given a history of polysubstance abuse.  Apparently his home health agency had concerns about ongoing IVDU so during his most recent hospitalization there was concern about replacing a PIC and treating him again as an outpatient.  He refused inpatient rehab and skilled nursing facility placement.  He received 2 weeks of IV daptomycin completing therapy on 10/04/2018 followed by 4 weeks of oral doxycycline.  He ran out 10 days ago.  He says that he is feeling much better.  He is still having back pain but nowhere near as bad as when he was admitted.  He says that his back pain was 20 out of 10 on admission and is now only 8 out of 10.  He is able to be a little more active at home but it sounds like he spends most of his day sitting on the couch or in a wheelchair.  Review of Systems: Review of Systems  Constitutional: Negative for chills, diaphoresis and fever.  Respiratory: Negative for cough and shortness of breath.   Cardiovascular: Negative for chest pain.  Gastrointestinal: Negative for abdominal pain, diarrhea, nausea and vomiting.  Musculoskeletal: Positive for back pain.    Past Medical History:  Diagnosis Date   Alcohol abuse    Anemia    Arthritis    Ascites    Cirrhosis ([name])    Coffee ground emesis    Dehydration 06/17/2017   Febrile illness    Heart murmur    Hyperlipidemia    Hypertension    Leg swelling    Myocardial infarction ([place]) 2012   Preop cardiovascular exam 04/14/2013   Sepsis ([place]) 06/17/2017   Septic shock ([place]) 06/18/2017   SIRS (systemic inflammatory response syndrome) ([place]) 07/11/2017   Stroke ([place]) 2012   no deficits   Thrombocytopenia ([name])     Social History   Tobacco Use   Smoking status: Former Smoker    Packs/day: 0.50    Years: 10.00    Pack years: 5.00    Types: Cigarettes      Quit date: 01/05/2016    Years since quitting: 2.8   Smokeless tobacco: Never Used   Tobacco comment: 4 cigarettes a day  Substance Use Topics   Alcohol use: Yes   Drug use: Yes    Types: Marijuana, Heroin    Family History  Problem Relation Age of Onset   Hypertension Father    Diabetes Father    Dementia Mother    Lupus Sister     No Known Allergies  Objective: Vitals:   11/15/18 1513  BP: (!) 111/91  Pulse: 86  Temp: 98 F (36.7 C)   There is no height or weight on file to calculate BMI.  Physical Exam Constitutional:      Comments: He is very thin.  He is seated in a wheelchair.  He is accompanied by his wife.  They argue over virtually everything we discussed during the visit.  Cardiovascular:     Rate and Rhythm: Normal rate and regular rhythm.     Heart sounds: No murmur.  Pulmonary:     Effort: Pulmonary effort is normal.     Breath sounds: Normal breath sounds.  Neurological:     Comments: He does not appear to have any focal weakness.     Lab Results Sed Rate (mm/hr)  Date Value  09/20/2018 55 (H)   CRP (mg/dL)  Date Value  09/20/2018 18.9 (H)     Problem List Items Addressed This Visit      High   Epidural abscess    Given that he was not treated with the standard 6 weeks of IV antibiotic therapy I favor restarting oral doxycycline and repeating lab work today.  He will follow-up in 6 weeks.  He is in agreement with that plan.      Relevant Orders   Basic metabolic panel   C-reactive protein   Sedimentation rate       Mark Bickers, MD [facility] for Infectious [place] [phone] pager   [phone] cell 11/15/2018, 3:33 PM

## 2018-11-16 LAB — BASIC METABOLIC PANEL WITH GFR
BUN/Creatinine Ratio: 11 (calc) (ref 6–22)
BUN: 20 mg/dL (ref 7–25)
CO2: 21 mmol/L (ref 20–32)
Calcium: 9.3 mg/dL (ref 8.6–10.3)
Chloride: 105 mmol/L (ref 98–110)
Creat: 1.84 mg/dL — ABNORMAL HIGH (ref 0.70–1.25)
Glucose, Bld: 91 mg/dL (ref 65–99)
Potassium: 4.4 mmol/L (ref 3.5–5.3)
Sodium: 134 mmol/L — ABNORMAL LOW (ref 135–146)

## 2018-11-16 LAB — SEDIMENTATION RATE: Sed Rate: >130 mm/h — ABNORMAL HIGH (ref 0–20)

## 2018-11-16 LAB — C-REACTIVE PROTEIN: CRP: 3.5 mg/L (ref <8.0)

## 2018-11-19 ENCOUNTER — Telehealth: Payer: Self-pay | Admitting: Family Medicine

## 2018-11-19 NOTE — Telephone Encounter (Signed)
Pt had a visit with dr banks on 11/03/2018. Pt needs a refill on vit b1 100 mg. Pt takes one pill a day. This med was prescribed when pt was in hospital. [address] in [place]. Pt is out of med

## 2018-11-19 NOTE — Telephone Encounter (Signed)
Dr. Banks please advise  

## 2018-11-25 ENCOUNTER — Telehealth: Payer: Self-pay | Admitting: Family Medicine

## 2018-11-25 ENCOUNTER — Telehealth: Payer: Self-pay | Admitting: *Deleted

## 2018-11-25 NOTE — Telephone Encounter (Signed)
Pt. States he is using heat to his back now. A request has been sent to PCP for refill of pain medication, per PEC agent.
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
## 2018-11-25 NOTE — Telephone Encounter (Signed)
Relation to pt: self  Call back number: [phone]  Pharmacy: [facility] [place], [name] AT [address] [phone] (Phone) [phone] (Fax)     Reason for call:  Patient requesting oxyCODONE (OXY IR/ROXICODONE) 5 MG immediate release tablet due to lower back pain, patient states after therapy today back pain increased to a 10 pain level. Patient requesting Rx sent today, please advise due to wife dropping medication in the sink.

## 2018-11-26 NOTE — Telephone Encounter (Signed)
Patient is calling back to check on the status of his medication refill. Patient states that he is hurting bad. The patient states he is tired of hurting.

## 2018-12-01 NOTE — Telephone Encounter (Signed)
Please advise 

## 2018-12-02 ENCOUNTER — Other Ambulatory Visit: Payer: Self-pay | Admitting: Family Medicine

## 2018-12-02 DIAGNOSIS — Z515 Encounter for palliative care: Secondary | ICD-10-CM | POA: Insufficient documentation

## 2018-12-02 MED ORDER — OXYCODONE HCL 5 MG PO TABS: 5.0000 mg | ORAL_TABLET | ORAL | 0 refills | Status: DC | PRN

## 2018-12-02 NOTE — Telephone Encounter (Signed)
Refill sent.

## 2018-12-08 ENCOUNTER — Other Ambulatory Visit: Payer: Self-pay | Admitting: Family Medicine

## 2018-12-14 ENCOUNTER — Telehealth: Payer: Self-pay | Admitting: Family Medicine

## 2018-12-14 NOTE — Telephone Encounter (Signed)
Verbal orders given  

## 2018-12-14 NOTE — Telephone Encounter (Signed)
Copied from [facility] [phone]. Topic: General - Other >> Dec 14, 2018  8:40 AM Keene Breath wrote: Reason for CRM: Calling to request verbal orders to continue Hospice care for patient.  Please advise and call for approval.  CB# [phone]

## 2018-12-22 ENCOUNTER — Other Ambulatory Visit: Payer: Self-pay

## 2018-12-22 ENCOUNTER — Encounter (HOSPITAL_COMMUNITY): Payer: Self-pay

## 2018-12-22 ENCOUNTER — Emergency Department (HOSPITAL_COMMUNITY)

## 2018-12-22 ENCOUNTER — Observation Stay (HOSPITAL_COMMUNITY)
Admission: EM | Admit: 2018-12-22 | Discharge: 2018-12-24 | Disposition: A | Discharge status: Home / Self Care | Attending: Internal Medicine | Admitting: Internal Medicine

## 2018-12-22 DIAGNOSIS — M1990 Unspecified osteoarthritis, unspecified site: Secondary | ICD-10-CM | POA: Insufficient documentation

## 2018-12-22 DIAGNOSIS — K7682 Hepatic encephalopathy: Secondary | ICD-10-CM | POA: Diagnosis present

## 2018-12-22 DIAGNOSIS — N179 Acute kidney failure, unspecified: Secondary | ICD-10-CM | POA: Insufficient documentation

## 2018-12-22 DIAGNOSIS — Z79899 Other long term (current) drug therapy: Secondary | ICD-10-CM | POA: Insufficient documentation

## 2018-12-22 DIAGNOSIS — Z20828 Contact with and (suspected) exposure to other viral communicable diseases: Secondary | ICD-10-CM | POA: Diagnosis not present

## 2018-12-22 DIAGNOSIS — I252 Old myocardial infarction: Secondary | ICD-10-CM | POA: Insufficient documentation

## 2018-12-22 DIAGNOSIS — M47816 Spondylosis without myelopathy or radiculopathy, lumbar region: Secondary | ICD-10-CM | POA: Diagnosis not present

## 2018-12-22 DIAGNOSIS — Z87891 Personal history of nicotine dependence: Secondary | ICD-10-CM | POA: Insufficient documentation

## 2018-12-22 DIAGNOSIS — Z8673 Personal history of transient ischemic attack (TIA), and cerebral infarction without residual deficits: Secondary | ICD-10-CM | POA: Diagnosis not present

## 2018-12-22 DIAGNOSIS — K7290 Hepatic failure, unspecified without coma: Principal | ICD-10-CM | POA: Insufficient documentation

## 2018-12-22 DIAGNOSIS — W19XXXA Unspecified fall, initial encounter: Secondary | ICD-10-CM

## 2018-12-22 DIAGNOSIS — D638 Anemia in other chronic diseases classified elsewhere: Secondary | ICD-10-CM | POA: Insufficient documentation

## 2018-12-22 DIAGNOSIS — K7030 Alcoholic cirrhosis of liver without ascites: Secondary | ICD-10-CM | POA: Diagnosis not present

## 2018-12-22 DIAGNOSIS — F1910 Other psychoactive substance abuse, uncomplicated: Secondary | ICD-10-CM | POA: Insufficient documentation

## 2018-12-22 DIAGNOSIS — K7460 Unspecified cirrhosis of liver: Secondary | ICD-10-CM | POA: Diagnosis present

## 2018-12-22 DIAGNOSIS — E46 Unspecified protein-calorie malnutrition: Secondary | ICD-10-CM

## 2018-12-22 DIAGNOSIS — Z8249 Family history of ischemic heart disease and other diseases of the circulatory system: Secondary | ICD-10-CM | POA: Insufficient documentation

## 2018-12-22 DIAGNOSIS — Z66 Do not resuscitate: Secondary | ICD-10-CM | POA: Diagnosis not present

## 2018-12-22 DIAGNOSIS — E785 Hyperlipidemia, unspecified: Secondary | ICD-10-CM | POA: Diagnosis not present

## 2018-12-22 LAB — CBC WITH DIFFERENTIAL/PLATELET
Abs Immature Granulocytes: 0.01 10*3/uL (ref 0.00–0.07)
Basophils Absolute: 0 10*3/uL (ref 0.0–0.1)
Basophils Relative: 1 %
Eosinophils Absolute: 0.1 10*3/uL (ref 0.0–0.5)
Eosinophils Relative: 2 %
HCT: 28 % — ABNORMAL LOW (ref 39.0–52.0)
Hemoglobin: 9.3 g/dL — ABNORMAL LOW (ref 13.0–17.0)
Immature Granulocytes: 0 %
Lymphocytes Relative: 25 %
Lymphs Abs: 1.1 10*3/uL (ref 0.7–4.0)
MCH: 31 pg (ref 26.0–34.0)
MCHC: 33.2 g/dL (ref 30.0–36.0)
MCV: 93.3 fL (ref 80.0–100.0)
Monocytes Absolute: 0.4 10*3/uL (ref 0.1–1.0)
Monocytes Relative: 9 %
Neutro Abs: 2.7 10*3/uL (ref 1.7–7.7)
Neutrophils Relative %: 63 %
Platelets: 130 10*3/uL — ABNORMAL LOW (ref 150–400)
RBC: 3 MIL/uL — ABNORMAL LOW (ref 4.22–5.81)
RDW: 15.3 % (ref 11.5–15.5)
WBC: 4.3 10*3/uL (ref 4.0–10.5)
nRBC: 0 % (ref 0.0–0.2)

## 2018-12-22 LAB — COMPREHENSIVE METABOLIC PANEL
ALT: 14 U/L (ref 0–44)
AST: 29 U/L (ref 15–41)
Albumin: 3 g/dL — ABNORMAL LOW (ref 3.5–5.0)
Alkaline Phosphatase: 151 U/L — ABNORMAL HIGH (ref 38–126)
Anion gap: 12 (ref 5–15)
BUN: 34 mg/dL — ABNORMAL HIGH (ref 8–23)
CO2: 16 mmol/L — ABNORMAL LOW (ref 22–32)
Calcium: 10.2 mg/dL (ref 8.9–10.3)
Chloride: 108 mmol/L (ref 98–111)
Creatinine, Ser: 2.59 mg/dL — ABNORMAL HIGH (ref 0.61–1.24)
GFR calc Af Amer: 29 mL/min — ABNORMAL LOW (ref >60)
GFR calc non Af Amer: 25 mL/min — ABNORMAL LOW (ref >60)
Glucose, Bld: 100 mg/dL — ABNORMAL HIGH (ref 70–99)
Potassium: 4.9 mmol/L (ref 3.5–5.1)
Sodium: 136 mmol/L (ref 135–145)
Total Bilirubin: 1.3 mg/dL — ABNORMAL HIGH (ref 0.3–1.2)
Total Protein: 8.3 g/dL — ABNORMAL HIGH (ref 6.5–8.1)

## 2018-12-22 LAB — CBG MONITORING, ED: Glucose-Capillary: 82 mg/dL (ref 70–99)

## 2018-12-22 LAB — AMMONIA: Ammonia: 56 umol/L — ABNORMAL HIGH (ref 9–35)

## 2018-12-22 LAB — PROTIME-INR
INR: 1.2 (ref 0.8–1.2)
Prothrombin Time: 14.6 seconds (ref 11.4–15.2)

## 2018-12-22 LAB — ETHANOL: Alcohol, Ethyl (B): <10 mg/dL (ref <10)

## 2018-12-22 MED ORDER — LACTULOSE 10 GM/15ML PO SOLN
45.0000 g | Freq: Once | ORAL | Status: AC
  Administered 2018-12-23: 01:00:00 45 g via ORAL
  Filled 2018-12-22: qty 90

## 2018-12-22 MED ORDER — SODIUM CHLORIDE 0.9 % IV BOLUS: 1000.0000 mL | Freq: Once | INTRAVENOUS | Status: DC
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
## 2018-12-22 NOTE — ED Notes (Signed)
Patient wife calling asking for a call back  Mark [name] [phone]

## 2018-12-22 NOTE — ED Notes (Signed)
Pt transported to CT ?
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
## 2018-12-22 NOTE — ED Notes (Signed)
cbg 82

## 2018-12-22 NOTE — ED Notes (Signed)
Pt placed on a condom cath. 

## 2018-12-23 ENCOUNTER — Encounter (HOSPITAL_COMMUNITY): Payer: Self-pay | Admitting: Internal Medicine

## 2018-12-23 ENCOUNTER — Observation Stay (HOSPITAL_COMMUNITY)

## 2018-12-23 DIAGNOSIS — K7290 Hepatic failure, unspecified without coma: Secondary | ICD-10-CM

## 2018-12-23 DIAGNOSIS — K7460 Unspecified cirrhosis of liver: Secondary | ICD-10-CM

## 2018-12-23 DIAGNOSIS — N179 Acute kidney failure, unspecified: Secondary | ICD-10-CM

## 2018-12-23 LAB — COMPREHENSIVE METABOLIC PANEL
ALT: 11 U/L (ref 0–44)
AST: 24 U/L (ref 15–41)
Albumin: 2.8 g/dL — ABNORMAL LOW (ref 3.5–5.0)
Alkaline Phosphatase: 126 U/L (ref 38–126)
Anion gap: 11 (ref 5–15)
BUN: 37 mg/dL — ABNORMAL HIGH (ref 8–23)
CO2: 16 mmol/L — ABNORMAL LOW (ref 22–32)
Calcium: 10 mg/dL (ref 8.9–10.3)
Chloride: 113 mmol/L — ABNORMAL HIGH (ref 98–111)
Creatinine, Ser: 2.57 mg/dL — ABNORMAL HIGH (ref 0.61–1.24)
GFR calc Af Amer: 29 mL/min — ABNORMAL LOW (ref >60)
GFR calc non Af Amer: 25 mL/min — ABNORMAL LOW (ref >60)
Glucose, Bld: 81 mg/dL (ref 70–99)
Potassium: 4.5 mmol/L (ref 3.5–5.1)
Sodium: 140 mmol/L (ref 135–145)
Total Bilirubin: 1.1 mg/dL (ref 0.3–1.2)
Total Protein: 7.4 g/dL (ref 6.5–8.1)

## 2018-12-23 LAB — URINALYSIS, ROUTINE W REFLEX MICROSCOPIC
Bilirubin Urine: NEGATIVE
Glucose, UA: NEGATIVE mg/dL
Ketones, ur: NEGATIVE mg/dL
Leukocytes,Ua: NEGATIVE
Nitrite: NEGATIVE
Protein, ur: NEGATIVE mg/dL
Specific Gravity, Urine: 1.011 (ref 1.005–1.030)
pH: 6 (ref 5.0–8.0)

## 2018-12-23 LAB — CBC WITH DIFFERENTIAL/PLATELET
Abs Immature Granulocytes: 0.04 10*3/uL (ref 0.00–0.07)
Basophils Absolute: 0 10*3/uL (ref 0.0–0.1)
Basophils Relative: 1 %
Eosinophils Absolute: 0.1 10*3/uL (ref 0.0–0.5)
Eosinophils Relative: 2 %
HCT: 42.7 % (ref 39.0–52.0)
Hemoglobin: 14.5 g/dL (ref 13.0–17.0)
Immature Granulocytes: 1 %
Lymphocytes Relative: 24 %
Lymphs Abs: 0.8 10*3/uL (ref 0.7–4.0)
MCH: 30.6 pg (ref 26.0–34.0)
MCHC: 34 g/dL (ref 30.0–36.0)
MCV: 90.1 fL (ref 80.0–100.0)
Monocytes Absolute: 0.3 10*3/uL (ref 0.1–1.0)
Monocytes Relative: 8 %
Neutro Abs: 1.9 10*3/uL (ref 1.7–7.7)
Neutrophils Relative %: 64 %
Platelets: 96 10*3/uL — ABNORMAL LOW (ref 150–400)
RBC: 4.74 MIL/uL (ref 4.22–5.81)
RDW: 15.3 % (ref 11.5–15.5)
WBC: 3.1 10*3/uL — ABNORMAL LOW (ref 4.0–10.5)
nRBC: 0 % (ref 0.0–0.2)

## 2018-12-23 LAB — FOLATE: Folate: 52 ng/mL (ref >5.9)

## 2018-12-23 LAB — FERRITIN: Ferritin: 1116 ng/mL — ABNORMAL HIGH (ref 24–336)

## 2018-12-23 LAB — IRON AND TIBC
Iron: 167 ug/dL (ref 45–182)
Saturation Ratios: 81 % — ABNORMAL HIGH (ref 17.9–39.5)
TIBC: 207 ug/dL — ABNORMAL LOW (ref 250–450)
UIBC: 40 ug/dL

## 2018-12-23 LAB — SARS CORONAVIRUS 2 (TAT 6-24 HRS): SARS Coronavirus 2: NEGATIVE

## 2018-12-23 LAB — VITAMIN B12: Vitamin B-12: 492 pg/mL (ref 180–914)

## 2018-12-23 LAB — RETICULOCYTES
Immature Retic Fract: 8.5 % (ref 2.3–15.9)
RBC.: 3.05 MIL/uL — ABNORMAL LOW (ref 4.22–5.81)
Retic Count, Absolute: 38.7 10*3/uL (ref 19.0–186.0)
Retic Ct Pct: 1.3 % (ref 0.4–3.1)

## 2018-12-23 LAB — TSH: TSH: 0.806 u[IU]/mL (ref 0.350–4.500)

## 2018-12-23 LAB — RAPID URINE DRUG SCREEN, HOSP PERFORMED
Amphetamines: NOT DETECTED
Barbiturates: NOT DETECTED
Benzodiazepines: NOT DETECTED
Cocaine: NOT DETECTED
Opiates: POSITIVE — AB
Tetrahydrocannabinol: NOT DETECTED

## 2018-12-23 LAB — SODIUM, URINE, RANDOM: Sodium, Ur: 40 mmol/L

## 2018-12-23 MED ORDER — THIAMINE HCL 100 MG PO TABS: 100.0000 mg | ORAL_TABLET | Freq: Every day | ORAL | Status: DC

## 2018-12-23 MED ORDER — ADULT MULTIVITAMIN W/MINERALS CH: 1.0000 | ORAL_TABLET | Freq: Every day | ORAL | Status: DC

## 2018-12-23 MED ORDER — LORAZEPAM 1 MG PO TABS
1.0000 mg | ORAL_TABLET | Freq: Four times a day (QID) | ORAL | Status: DC | PRN
  Filled 2018-12-23: qty 1

## 2018-12-23 MED ORDER — HEPARIN SODIUM (PORCINE) 5000 UNIT/ML IJ SOLN
5000.0000 [IU] | Freq: Three times a day (TID) | INTRAMUSCULAR | Status: DC
  Administered 2018-12-23 – 2018-12-24 (×3): 5000 [IU] via SUBCUTANEOUS
  Filled 2018-12-23 (×4): qty 1

## 2018-12-23 MED ORDER — ONDANSETRON HCL 4 MG PO TABS: 4.0000 mg | ORAL_TABLET | Freq: Four times a day (QID) | ORAL | Status: DC | PRN

## 2018-12-23 MED ORDER — LACTULOSE 10 GM/15ML PO SOLN
30.0000 g | Freq: Three times a day (TID) | ORAL | Status: DC
  Filled 2018-12-23: qty 45

## 2018-12-23 MED ORDER — ALBUTEROL SULFATE (2.5 MG/3ML) 0.083% IN NEBU: 3.0000 mL | INHALATION_SOLUTION | Freq: Four times a day (QID) | RESPIRATORY_TRACT | Status: DC | PRN

## 2018-12-23 MED ORDER — OXYCODONE HCL 5 MG PO TABS
5.0000 mg | ORAL_TABLET | ORAL | Status: DC | PRN
  Administered 2018-12-23 – 2018-12-24 (×4): 5 mg via ORAL
  Filled 2018-12-23 (×4): qty 1

## 2018-12-23 MED ORDER — FOLIC ACID 1 MG PO TABS
1.0000 mg | ORAL_TABLET | Freq: Every day | ORAL | Status: DC
  Administered 2018-12-23 – 2018-12-24 (×2): 1 mg via ORAL
  Filled 2018-12-23 (×2): qty 1

## 2018-12-23 MED ORDER — ALBUMIN HUMAN 25 % IV SOLN
12.5000 g | Freq: Four times a day (QID) | INTRAVENOUS | Status: AC
  Administered 2018-12-23 (×3): 12.5 g via INTRAVENOUS
  Filled 2018-12-23 (×4): qty 50

## 2018-12-23 MED ORDER — PANTOPRAZOLE SODIUM 40 MG PO TBEC
40.0000 mg | DELAYED_RELEASE_TABLET | Freq: Every day | ORAL | Status: DC
  Administered 2018-12-23 – 2018-12-24 (×2): 40 mg via ORAL
  Filled 2018-12-23 (×2): qty 1

## 2018-12-23 MED ORDER — LACTULOSE 10 GM/15ML PO SOLN
30.0000 g | Freq: Three times a day (TID) | ORAL | Status: DC
  Administered 2018-12-23 (×3): 30 g via ORAL
  Administered 2018-12-24: 10 g via ORAL
  Filled 2018-12-23 (×3): qty 45

## 2018-12-23 MED ORDER — VITAMIN B-1 100 MG PO TABS
100.0000 mg | ORAL_TABLET | Freq: Every day | ORAL | Status: DC
  Administered 2018-12-23 – 2018-12-24 (×2): 100 mg via ORAL
  Filled 2018-12-23 (×2): qty 1

## 2018-12-23 MED ORDER — ACETAMINOPHEN 325 MG PO TABS
650.0000 mg | ORAL_TABLET | Freq: Four times a day (QID) | ORAL | Status: DC | PRN
  Administered 2018-12-23 – 2018-12-24 (×2): 650 mg via ORAL
  Filled 2018-12-23 (×2): qty 2

## 2018-12-23 MED ORDER — ACETAMINOPHEN 650 MG RE SUPP: 650.0000 mg | Freq: Four times a day (QID) | RECTAL | Status: DC | PRN

## 2018-12-23 MED ORDER — ONDANSETRON HCL 4 MG/2ML IJ SOLN: 4.0000 mg | Freq: Four times a day (QID) | INTRAMUSCULAR | Status: DC | PRN

## 2018-12-23 MED ORDER — NADOLOL 20 MG PO TABS
20.0000 mg | ORAL_TABLET | Freq: Every day | ORAL | Status: DC
  Administered 2018-12-23 – 2018-12-24 (×2): 20 mg via ORAL
  Filled 2018-12-23 (×2): qty 1

## 2018-12-23 MED ORDER — FOLIC ACID 1 MG PO TABS: 1.0000 mg | ORAL_TABLET | Freq: Every day | ORAL | Status: DC

## 2018-12-23 MED ORDER — SODIUM CHLORIDE 0.9 % IV BOLUS: 250.0000 mL | Freq: Once | INTRAVENOUS | Status: DC

## 2018-12-23 MED ORDER — THIAMINE HCL 100 MG/ML IJ SOLN: 100.0000 mg | Freq: Every day | INTRAMUSCULAR | Status: DC

## 2018-12-23 MED ORDER — LORAZEPAM 2 MG/ML IJ SOLN: 1.0000 mg | Freq: Four times a day (QID) | INTRAMUSCULAR | Status: DC | PRN

## 2018-12-23 MED ORDER — ADULT MULTIVITAMIN W/MINERALS CH
1.0000 | ORAL_TABLET | Freq: Every day | ORAL | Status: DC
  Administered 2018-12-23 – 2018-12-24 (×2): 1 via ORAL
  Filled 2018-12-23 (×2): qty 1

## 2018-12-23 MED ORDER — DOXYCYCLINE HYCLATE 100 MG PO TABS
100.0000 mg | ORAL_TABLET | Freq: Two times a day (BID) | ORAL | Status: DC
  Administered 2018-12-23 – 2018-12-24 (×3): 100 mg via ORAL
  Filled 2018-12-23 (×3): qty 1

## 2018-12-23 NOTE — H&P (Signed)
History and Physical    Mark [name] [id number] DOB: 22-May-1955 DOA: 12/22/2018  PCP: Mark Ruddy, MD  Patient coming from: Home.  History obtained from patient's wife.  Chief Complaint: Confusion.  HPI: Mark [name] is a 63 y.o. male with history of alcoholic liver cirrhosis admitted in June for epidural abscess had had IV antibiotics and presently on doxycycline was brought to the ER after patient was found to be increasingly confused over the last 24 hours.  As per the patient's wife patient was found to be confused and standing in the kitchen yesterday morning.  Patient has just recently started walking again of the epidural abscess.  He has to hold onto things for walking.  Has had at least 3 falls over the last few days.  Did not lose consciousness.  Has not had any nausea vomiting or diarrhea still drinks alcohol and has been taking his medicines but not lactulose as needed to be taken.  ED Course: In the ER patient is appearing confused and lethargic.  Oriented to his name and place moves all extremities.  Labs show ammonia of 56 and creatinine is increased from baseline to 2.5.  COVID-19 test was negative.  Patient is afebrile.  Was given lactulose and admitted for acute hepatic encephalopathy and acute renal failure.  Review of Systems: As per HPI, rest all negative.   Past Medical History:  Diagnosis Date   Alcohol abuse    Anemia    Arthritis    Ascites    Cirrhosis ([place])    Coffee ground emesis    Dehydration 06/17/2017   Febrile illness    Heart murmur    Hyperlipidemia    Hypertension    Leg swelling    Myocardial infarction ([place]) 2012   Preop cardiovascular exam 04/14/2013   Sepsis ([place]) 06/17/2017   Septic shock ([place]) 06/18/2017   SIRS (systemic inflammatory response syndrome) ([place]) 07/11/2017   Stroke ([place]) 2012   no deficits   Thrombocytopenia ([place])     Past Surgical History:  Procedure Laterality Date    COLONOSCOPY WITH PROPOFOL N/A 02/07/2016   Procedure: COLONOSCOPY WITH PROPOFOL;  Surgeon: Mark Banister, MD;  Location: [facility]COPY;  Service: Endoscopy;  Laterality: N/A;   ESOPHAGOGASTRODUODENOSCOPY (EGD) WITH PROPOFOL N/A 02/07/2016   Procedure: ESOPHAGOGASTRODUODENOSCOPY (EGD) WITH PROPOFOL;  Surgeon: Mark Banister, MD;  Location: [facility];  Service: Endoscopy;  Laterality: N/A;   ESOPHAGOGASTRODUODENOSCOPY (EGD) WITH PROPOFOL N/A 06/22/2017   Procedure: ESOPHAGOGASTRODUODENOSCOPY (EGD) WITH PROPOFOL;  Surgeon: Mark Bears, MD;  Location[facility] ENDOSCOPY;  Service: Gastroenterology;  Laterality: N/A;   HERNIA REPAIR Right    inguinal   KNEE ARTHROSCOPY     bilateral/  12/14   TEE WITHOUT CARDIOVERSION N/A 05/13/2018   Procedure: TRANSESOPHAGEAL ECHOCARDIOGRAM (TEE);  Surgeon: Mark Munroe, MD;  Location: [place];  Service: Cardiovascular;  Laterality: N/A;   TOTAL KNEE ARTHROPLASTY Right 05/02/2013   Procedure: RIGHT TOTAL KNEE ARTHROPLASTY;  Surgeon: Mark Pole, MD;  Location: [facility];  Service: Orthopedics;  Laterality: Right;     reports that he quit smoking about 2 years ago. His smoking use included cigarettes. He has a 5.00 pack-year smoking history. He has never used smokeless tobacco. He reports current alcohol use. He reports current drug use. Drugs: Marijuana and Heroin.  No Known Allergies  Family History  Problem Relation Age of Onset   Hypertension Father    Diabetes Father    Dementia Mother  Lupus Sister     Prior to Admission medications   Medication Sig Start Date End Date Taking? Authorizing Provider  albuterol (PROVENTIL HFA;VENTOLIN HFA) 108 (90 Base) MCG/ACT inhaler Inhale 2 puffs into the lungs every 6 (six) hours as needed for wheezing or shortness of breath. 09/16/17   Mark Ruddy, MD  diphenhydrAMINE-zinc acetate (BENADRYL EXTRA STRENGTH) cream Apply 1 application topically 3 (three) times daily as needed for itching.  10/05/18   Mark August, MD  doxycycline (VIBRA-TABS) 100 MG tablet Take 1 tablet (100 mg total) by mouth every 12 (twelve) hours. 11/15/18   Mark Bickers, MD  folic acid (FOLVITE) 1 MG tablet TAKE 1 TABLET BY MOUTH EVERY DAY 11/03/18   Mark Ruddy, MD  furosemide (LASIX) 20 MG tablet Take 1 tablet (20 mg total) by mouth 2 (two) times daily. 10/05/18   Mark August, MD  ketoconazole (NIZORAL) 2 % shampoo Apply 1 application topically 2 (two) times a week. 10/28/18   Mark Ruddy, MD  lactulose (CHRONULAC) 10 GM/15ML solution TAKE 45 MILLILITERS BY MOUTH TWICE DAILY Patient taking differently: Take 30 g by mouth 2 (two) times daily. Taking 27ml as needed 08/26/18   Mark Ruddy, MD  Multiple Vitamin (MULTIVITAMIN WITH MINERALS) TABS tablet Take 1 tablet by mouth daily. 10/06/18   Mark August, MD  nadolol (CORGARD) 20 MG tablet TAKE 1 TABLET BY MOUTH EVERY DAY 12/08/18   Mark Ruddy, MD  ondansetron (ZOFRAN) 4 MG tablet Take 1 tablet (4 mg total) by mouth every 6 (six) hours as needed for nausea. 10/05/18   Mark August, MD  oxyCODONE (OXY IR/ROXICODONE) 5 MG immediate release tablet Take 1 tablet (5 mg total) by mouth every 4 (four) hours as needed for moderate pain. 12/02/18   Mark Ruddy, MD  pantoprazole (PROTONIX) 40 MG tablet Take 1 tablet (40 mg total) by mouth daily. 10/05/18   Mark August, MD  spironolactone (ALDACTONE) 50 MG tablet Take 1 tablet (50 mg total) by mouth 2 (two) times daily. 10/05/18   Mark August, MD  thiamine 100 MG tablet Take 1 tablet (100 mg total) by mouth daily. 10/05/18   Mark August, MD  triamcinolone (KENALOG) 0.025 % ointment Apply 1 application topically 2 (two) times daily as needed (rash). 05/14/18   Mark August, MD    Physical Exam: Constitutional: Moderately built and poorly nourished. Vitals:   12/22/18 2003 12/22/18 2151 12/23/18 0048  BP: 132/79 122/68 (!) 135/57  Pulse: 65 65 63  Resp: 15 (!) 22 18  Temp: 97.8 F (36.6 C)      TempSrc: Oral    SpO2: 100% 99% 100%   Eyes: Anicteric no pallor. ENMT: No discharge from the ears eyes nose and mouth. Neck: No mass felt.  No neck rigidity. Respiratory: No rhonchi or crepitations. Cardiovascular: S1-S2 heard. Abdomen: Soft nontender bowel sounds present. Musculoskeletal: No edema. Skin: No rash. Neurologic: Mildly lethargic but oriented to his name and place moves all extremities. Psychiatric: Mildly lethargic but oriented to his name and place.   Labs on Admission: I have personally reviewed following labs and imaging studies  CBC: Recent Labs  Lab 12/22/18 2229  WBC 4.3  NEUTROABS 2.7  HGB 9.3*  HCT 28.0*  MCV 93.3  PLT [id number]*   Basic Metabolic Panel: Recent Labs  Lab 12/22/18 2023  NA 136  K 4.9  CL 108  CO2 16*  GLUCOSE 100*  BUN 34*  CREATININE 2.59*  CALCIUM 10.2   GFR:  CrCl cannot be calculated (Unknown ideal weight.). Liver Function Tests: Recent Labs  Lab 12/22/18 2023  AST 29  ALT 14  ALKPHOS 151*  BILITOT 1.3*  PROT 8.3*  ALBUMIN 3.0*   No results for input(s): LIPASE, AMYLASE in the last 168 hours. Recent Labs  Lab 12/22/18 2023  AMMONIA 56*   Coagulation Profile: Recent Labs  Lab 12/22/18 2023  INR 1.2   Cardiac Enzymes: No results for input(s): CKTOTAL, CKMB, CKMBINDEX, TROPONINI in the last 168 hours. BNP (last 3 results) No results for input(s): PROBNP in the last 8760 hours. HbA1C: No results for input(s): HGBA1C in the last 72 hours. CBG: Recent Labs  Lab 12/22/18 2006  GLUCAP 82   Lipid Profile: No results for input(s): CHOL, HDL, LDLCALC, TRIG, CHOLHDL, LDLDIRECT in the last 72 hours. Thyroid Function Tests: No results for input(s): TSH, T4TOTAL, FREET4, T3FREE, THYROIDAB in the last 72 hours. Anemia Panel: No results for input(s): VITAMINB12, FOLATE, FERRITIN, TIBC, IRON, RETICCTPCT in the last 72 hours. Urine analysis:    Component Value Date/Time   COLORURINE YELLOW 09/21/2018 1550    APPEARANCEUR HAZY (A) 09/21/2018 1550   LABSPEC 1.014 09/21/2018 1550   PHURINE 5.0 09/21/2018 1550   GLUCOSEU NEGATIVE 09/21/2018 1550   HGBUR LARGE (A) 09/21/2018 1550   BILIRUBINUR NEGATIVE 09/21/2018 1550   KETONESUR NEGATIVE 09/21/2018 1550   PROTEINUR NEGATIVE 09/21/2018 1550   UROBILINOGEN 2.0 (H) 04/26/2013 0945   NITRITE NEGATIVE 09/21/2018 1550   LEUKOCYTESUR NEGATIVE 09/21/2018 1550   Sepsis Labs: @LABRCNTIP (procalcitonin:4,lacticidven:4) )No results found for this or any previous visit (from the past 240 hour(s)).   Radiological Exams on Admission: Ct Head Wo Contrast  Result Date: 12/22/2018 CLINICAL DATA:  AMS, cirrhosis on lactulose EXAM: CT HEAD WITHOUT CONTRAST TECHNIQUE: Contiguous axial images were obtained from the base of the skull through the vertex without intravenous contrast. COMPARISON:  CT 05/10/2018 FINDINGS: Brain: No evidence of acute infarction, hemorrhage, hydrocephalus, extra-axial collection or mass lesion/mass effect. Symmetric prominence of the ventricles, cisterns and sulci compatible with parenchymal volume loss. Patchy areas of white matter hypoattenuation are most compatible with chronic microvascular angiopathy. Vascular: Atherosclerotic calcification of the carotid siphons and intradural vertebral arteries. No hyperdense vessel. Skull: No calvarial fracture or suspicious osseous lesion. No scalp swelling or hematoma. Sinuses/Orbits: Mild mural disease within the left sphenoid sinus. Remainder of the paranasal sinuses are predominantly clear. Mastoid air cells are well aerated with pneumatization of the petrous apices. The included orbital structures are unremarkable. Other: None. IMPRESSION: 1. No acute intracranial abnormality. 2. Generalized parenchymal volume loss and chronic microvascular angiopathy. Electronically Signed   By: Lovena Le M.D.   On: 12/22/2018 21:55    EKG: Independently reviewed.  Normal sinus  rhythm.  Assessment/Plan Principal Problem:   Hepatic encephalopathy ([name]) Active Problems:   Cirrhosis, alcoholic ([name])   Polysubstance abuse ([place])   Anemia of chronic disease   AKI (acute kidney injury) ([place])    1. Hepatic encephalopathy -patient confusion is likely from hepatic encephalopathy and patient's wife states he has not been taking lactulose as he supposed to be taking.  Patient was given lactulose in the ER will continue orally at least 3 times daily and recheck ammonia and closely follow clinically.  CT head is unremarkable.  UA is pending.  In addition I have ordered thiamine levels along with B12 folate TSH RPR. 2. Acute renal failure -with creatinine in June was 0.7 November 23, 2018 was 1.8 and presently is 2.5.  Per  patient's wife patient has been eating but not to what he usually supposed to be.  No vomiting or diarrhea.  Will check urine sodium.  I have ordered 250 cc normal saline bolus and place patient on albumin infusion.  Will hold of Lasix and spironolactone.  Closely follow metabolic panel intake output.  UA and urine sodium is pending. 3. History of alcohol abuse -we will keep patient on CIWA protocol. 4. Normocytic normochromic anemia appears to be chronic.  Follow CBC. 5. Recently diagnosed epidural abscess in June 2020 presently on oral doxycycline.  Patient denies any back pain and is able to move his lower extremities and as per the patient's wife has just recently started walking again.  X-ray pelvis is pending which was ordered because of recurrent falls.   DVT prophylaxis: SCDs for now. Code Status: DNR confirmed with patient's wife. Family Communication: Patient's wife. Disposition Plan: To be determined. Consults called: None. Admission status: Observation.   Rise Patience MD [facility] Pager [phone].  If 7PM-7AM, please contact night-coverage [email or web address] Password [facility]  12/23/2018, 3:43 AM

## 2018-12-23 NOTE — Progress Notes (Addendum)
Patient admitted after midnight but care began prior to midnight in the ER.  He presents with hepatic encephalopathy and AKI related to medicine non-compliance. He denies confusion, and states he wants to go home. Ammonia level elevated on admission (very mild). Will increase lactulose to TID for now.  Cr elevated to 2.58 from <1 two months ago   1. Hepatic encephalopathy - has not taken meds at home.   Patient was given lactulose in the ER will continue orally at least 3 times daily and recheck ammonia and closely follow clinically.  CT head is unremarkable.  UA is + opiates. Ferritin 1116, TIBC 207. b12 and folate within normal limits.  2. Acute renal failure -with creatinine in June was 0.7 November 23, 2018 was 1.8 and presently is 2.5.  Per patient's wife patient has been eating but not to what he usually supposed to be.  No vomiting or diarrhea.  Will check urine sodium.  I have ordered 250 cc normal saline bolus and place patient on albumin infusion.  Will hold of Lasix and spironolactone.  Closely follow metabolic panel intake output.  UA and urine sodium is pending. 3. History of alcohol abuse -we will keep patient on CIWA protocol. No s/sx withdrawal 4. Normocytic normochromic anemia appears to be chronic.  Follow CBC. 5. Recently diagnosed epidural abscess in June 2020 presently on oral doxycycline.  Patient continues to  deny any back pain and is able to move his lower extremities and as per the patient's wife has just recently started walking    Dyanne Carrel, NP Eulogio Bear DO
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
## 2018-12-23 NOTE — Progress Notes (Signed)
[place] ([place])  GIP RN Visit  Mr. Salsbury is under hospice services at home with a diagnosis of alcoholic cirrhosis per Dr. Tomasa Hosteller.  Hospice was not notified of his admission to the hospital by his familiy.  Mr. Rickert is admitted with altered mental status.  This is a related hospital admission.  Wife states he has been taking all medications as prescribed to her knowledge.  She found him confused and incontinent yesterday and contacted EMS.    V/S:  98.2 oral, 121/58, HR 64, RR 18, SPO2 100% RA I&O:  +40 Lab work:  Chloride 113, CO2 16, BUN 37, Cr 2.57, gfr 29, ferritin 1116, WBC 3.1, platelets 96, ammonia 56, SARS coronavirus 2 is negative Diagnostics:  CT of head WNL, pelvis imaging negative for any fracture IV's/PRNs:  Albumin 25% Q6H, NS 1L bolus IV x 1  Problem List: Hepatic encephalopathy: Pt had not been taking his lactulose per wife. Acute renal failure:  Baseline Cr 0.7>2.57, fluid bolus, albumin administration, holding diuretics for now  D/C Planning: Home with hospice IDT:  Updated Family:  Updated GOC:  Appear clear  Venia Carbon RN, BSN, [facility] Liaison (in [place]) [phone]

## 2018-12-24 DIAGNOSIS — K7290 Hepatic failure, unspecified without coma: Secondary | ICD-10-CM | POA: Diagnosis not present

## 2018-12-24 LAB — BASIC METABOLIC PANEL
Anion gap: 11 (ref 5–15)
BUN: 34 mg/dL — ABNORMAL HIGH (ref 8–23)
CO2: 12 mmol/L — ABNORMAL LOW (ref 22–32)
Calcium: 9.4 mg/dL (ref 8.9–10.3)
Chloride: 117 mmol/L — ABNORMAL HIGH (ref 98–111)
Creatinine, Ser: 2.42 mg/dL — ABNORMAL HIGH (ref 0.61–1.24)
GFR calc Af Amer: 31 mL/min — ABNORMAL LOW (ref >60)
GFR calc non Af Amer: 27 mL/min — ABNORMAL LOW (ref >60)
Glucose, Bld: 95 mg/dL (ref 70–99)
Potassium: 3.5 mmol/L (ref 3.5–5.1)
Sodium: 140 mmol/L (ref 135–145)

## 2018-12-24 LAB — CBC
HCT: 22.2 % — ABNORMAL LOW (ref 39.0–52.0)
Hemoglobin: 7.6 g/dL — ABNORMAL LOW (ref 13.0–17.0)
MCH: 31.7 pg (ref 26.0–34.0)
MCHC: 34.2 g/dL (ref 30.0–36.0)
MCV: 92.5 fL (ref 80.0–100.0)
Platelets: 97 10*3/uL — ABNORMAL LOW (ref 150–400)
RBC: 2.4 MIL/uL — ABNORMAL LOW (ref 4.22–5.81)
RDW: 15.5 % (ref 11.5–15.5)
WBC: 4.6 10*3/uL (ref 4.0–10.5)
nRBC: 0 % (ref 0.0–0.2)

## 2018-12-24 LAB — MRSA PCR SCREENING: MRSA by PCR: NEGATIVE

## 2018-12-24 MED ORDER — SPIRONOLACTONE 50 MG PO TABS: 50.0000 mg | ORAL_TABLET | Freq: Two times a day (BID) | ORAL | 0 refills | Status: DC

## 2018-12-24 MED ORDER — FUROSEMIDE 20 MG PO TABS: 20.0000 mg | ORAL_TABLET | Freq: Two times a day (BID) | ORAL | 0 refills | Status: DC

## 2018-12-24 NOTE — Progress Notes (Addendum)
[place] [phone]  [facility] ([place])  GIP RN Note  [name] is under hospice services at home with a diagnosis of alcoholic cirrhosis per Dr. Tomasa Hosteller.  Hospice was not notified of his admission to the hospital by his familiy.  [name] is admitted with altered mental status.  This is a related hospital admission.  Spoke with bedside RN who states patient has refused some of his medications this morning and asked for pain medication earlier than scheduled.   VS: 98.3, 122/60, 72, 100%RA I/O: 962/- Lab: Chloride 117, CO2 12, BUN 34, Creatinine 2.42, GFR 27  No new MD notes.  D/C Planning: Home with hospice IDT:  Updated Family:  Left VM for wife.  GOC:  Appear clear.   Please call with any hospice related questions or concerns.  If ambulance transport is needed at discharge, please use [facility] for active [facility] hospice patients.   Farrel Gordon, RN, CCM [facility] Liaison (in [phone]   In-Patient Referral v18.3 [facility] Transfer Summary 18) Routine visit - on 12-24-2018 Manufacturing engineer ([facility] Patient: [id number] Mark, [name] Documentation Advanced Directive: Select all that apply: Has Out of Facility DNR Transfer: Date: 12/23/2018, Patient went from: home, Patient went to: [facility] Patient Information: Yes, Date of Birth: 03-29-1956, Significant Health History: TERMINAL  DIAGNOSIS: Alcoholic cirrhosis of the liver RELATED  DISEASES:  MRSA bacteremia, L3 epidural abscess, polysubstance abuse including IVDA, hepatic encephalopathy, pancytopenia, dysphagia with aspiration pneumonia, CAD, HTN, stage 3 CKD, anemia of chronic disease., Date of [facility] Admission: 10/05/2018, Status Upon Transfer: Altered Mental Status, not stable Psychological Issues to be Aware of: anxiety related to hospitalization, illness Pain and Symptom Management Needs: confusion, hepatic  encephalopathy and AKI Services Provided by Hospice: RN, Aid, Chaplain, CSW, MD Ongoing Needs Hospice Unable to Meet: Wife unable to manage patient at home due to altered mental status End of Life Decisions: DNR Transfer Order: Yes Directions: none given To Whom Report Was Given/Date: not given. Transfer Physician/Phone Number: Dr. Grier Mitts [phone], Primary Caregiver/Phone Number: Mark, [name] [phone], Emergency Contact/Phone Number: Mark [name]

## 2018-12-24 NOTE — Discharge Summary (Signed)
Physician Discharge Summary  Mark [name] [id number] DOB: January 07, 1956 DOA: 12/22/2018  PCP: [name], MD  Admit date: 12/22/2018 Discharge date: 12/24/2018  Admitted From: home Discharge disposition: home   Recommendations for Outpatient Follow-Up:   1. Home with hospice 2. Consider changing lactulose to xifaxim (may get better patient compliance) 3. Adjust lasix and aldactone doses as needed (based on intake)   Discharge Diagnosis:   Principal Problem:   Hepatic encephalopathy ([place]) Active Problems:   Cirrhosis, alcoholic ([place])   Polysubstance abuse ([place])   Anemia of chronic disease   AKI (acute kidney injury) ([place])    Discharge Condition: Improved.  Diet recommendation: Low sodium, heart healthy.  Wound care: None.  Code status: Full.   History of Present Illness:   Mark [name] is a 63 y.o. male with history of alcoholic liver cirrhosis admitted in June for epidural abscess had had IV antibiotics and presently on doxycycline was brought to the ER after patient was found to be increasingly confused over the last 24 hours.  As per the patient's wife patient was found to be confused and standing in the kitchen yesterday morning.  Patient has just recently started walking again of the epidural abscess.  He has to hold onto things for walking.  Has had at least 3 falls over the last few days.  Did not lose consciousness.  Has not had any nausea vomiting or diarrhea still drinks alcohol and has been taking his medicines but not lactulose as needed to be taken.   Hospital Course by Problem:   1. Hepatic encephalopathy- has not taken meds at home.  Patient was given lactulose in the ER will continue orally at least 3 times daily and recheck ammonia and closely follow clinically. CT head is unremarkable. UA is + opiates. Ferritin 1116, TIBC 207. b12 and folate within normal limits.  2. Acute renal failure-with creatinine in June was 0.7  November 23, 2018 was 1.8 and presently is 2.5.  If not eating and drinking much, may need lower dose of lasix/aldactone 3. History of alcohol abuse-we will keep patient on CIWA protocol. No s/sx withdrawal 4. Normocytic normochromic anemia appears to be chronic. 5. Recently diagnosed epidural abscess in June 2020 presently on oral doxycycline. Patient continues to  deny any back pain and is able to move his lower extremities and as per the patient's wife has just recently started walking     Medical Consultants:      Discharge Exam:   Vitals:   12/24/18 0547 12/24/18 0645  BP:  122/60  Pulse:  72  Resp: 14   Temp:  98.3 F (36.8 C)  SpO2:  100%   Vitals:   12/23/18 2215 12/24/18 0125 12/24/18 0547 12/24/18 0645  BP: (!) 126/59   122/60  Pulse: 69   72  Resp: 14 14 14    Temp: 98.4 F (36.9 C)   98.3 F (36.8 C)  TempSrc: Oral   Oral  SpO2: 100%   100%  Weight:    66.8 kg    General exam: Appears calm and comfortable. Asking to go home   The results of significant diagnostics from this hospitalization (including imaging, microbiology, ancillary and laboratory) are listed below for reference.     Procedures and Diagnostic Studies:   Dg Pelvis 1-2 Views  Result Date: 12/23/2018 CLINICAL DATA:  Fall. EXAM: PELVIS - 1-2 VIEW COMPARISON:  CT 06/17/2017. FINDINGS: Degenerative changes lumbar spine and both hips. No  acute bony abnormality. No evidence of fracture. Pelvic calcifications consistent phleboliths. Distal ureteral stones cannot be excluded. Aortoiliac atherosclerotic vascular calcification. IMPRESSION: Degenerative changes lumbar spine and both hips. No acute bony abnormality. Electronically Signed   By: Marcello Moores  Register   On: 12/23/2018 05:35   Ct Head Wo Contrast  Result Date: 12/22/2018 CLINICAL DATA:  AMS, cirrhosis on lactulose EXAM: CT HEAD WITHOUT CONTRAST TECHNIQUE: Contiguous axial images were obtained from the base of the skull through the vertex without  intravenous contrast. COMPARISON:  CT 05/10/2018 FINDINGS: Brain: No evidence of acute infarction, hemorrhage, hydrocephalus, extra-axial collection or mass lesion/mass effect. Symmetric prominence of the ventricles, cisterns and sulci compatible with parenchymal volume loss. Patchy areas of white matter hypoattenuation are most compatible with chronic microvascular angiopathy. Vascular: Atherosclerotic calcification of the carotid siphons and intradural vertebral arteries. No hyperdense vessel. Skull: No calvarial fracture or suspicious osseous lesion. No scalp swelling or hematoma. Sinuses/Orbits: Mild mural disease within the left sphenoid sinus. Remainder of the paranasal sinuses are predominantly clear. Mastoid air cells are well aerated with pneumatization of the petrous apices. The included orbital structures are unremarkable. Other: None. IMPRESSION: 1. No acute intracranial abnormality. 2. Generalized parenchymal volume loss and chronic microvascular angiopathy. Electronically Signed   By: Lovena Le M.D.   On: 12/22/2018 21:55     Labs:   Basic Metabolic Panel: Recent Labs  Lab 12/22/18 2023 12/23/18 1210 12/24/18 0831  NA 136 140 140  K 4.9 4.5 3.5  CL 108 113* 117*  CO2 16* 16* 12*  GLUCOSE 100* 81 95  BUN 34* 37* 34*  CREATININE 2.59* 2.57* 2.42*  CALCIUM 10.2 10.0 9.4   GFR Estimated Creatinine Clearance: 26.5 mL/min (A) (by C-G formula based on SCr of 2.42 mg/dL (H)). Liver Function Tests: Recent Labs  Lab 12/22/18 2023 12/23/18 1210  AST 29 24  ALT 14 11  ALKPHOS 151* 126  BILITOT 1.3* 1.1  PROT 8.3* 7.4  ALBUMIN 3.0* 2.8*   No results for input(s): LIPASE, AMYLASE in the last 168 hours. Recent Labs  Lab 12/22/18 2023  AMMONIA 56*   Coagulation profile Recent Labs  Lab 12/22/18 2023  INR 1.2    CBC: Recent Labs  Lab 12/22/18 2229 12/23/18 1210  WBC 4.3 3.1*  NEUTROABS 2.7 1.9  HGB 9.3* 14.5  HCT 28.0* 42.7  MCV 93.3 90.1  PLT 130* 96*    Cardiac Enzymes: No results for input(s): CKTOTAL, CKMB, CKMBINDEX, TROPONINI in the last 168 hours. BNP: Invalid input(s): POCBNP CBG: Recent Labs  Lab 12/22/18 2006  GLUCAP 82   D-Dimer No results for input(s): DDIMER in the last 72 hours. Hgb A1c No results for input(s): HGBA1C in the last 72 hours. Lipid Profile No results for input(s): CHOL, HDL, LDLCALC, TRIG, CHOLHDL, LDLDIRECT in the last 72 hours. Thyroid function studies Recent Labs    12/23/18 1210  TSH 0.806   Anemia work up Recent Labs    12/23/18 0611  VITAMINB12 492  FOLATE 52.0  FERRITIN 1,116*  TIBC 207*  IRON 167  RETICCTPCT 1.3   Microbiology Recent Results (from the past 240 hour(s))  SARS CORONAVIRUS 2 (TAT 6-12 HRS) Nasal Swab Aptima Multi Swab     Status: None   Collection Time: 12/23/18  4:13 AM   Specimen: Aptima Multi Swab; Nasal Swab  Result Value Ref Range Status   SARS Coronavirus 2 NEGATIVE NEGATIVE Final    Comment: (NOTE) SARS-CoV-2 target nucleic acids are NOT DETECTED. The SARS-CoV-2 RNA  is generally detectable in upper and lower respiratory specimens during the acute phase of infection. Negative results do not preclude SARS-CoV-2 infection, do not rule out co-infections with other pathogens, and should not be used as the sole basis for treatment or other patient management decisions. Negative results must be combined with clinical observations, patient history, and epidemiological information. The expected result is Negative. Fact Sheet for Patients: SugarRoll.be Fact Sheet for Healthcare Providers: https://www.woods-mathews.com/ This test is not yet approved or cleared by the [place] FDA and  has been authorized for detection and/or diagnosis of SARS-CoV-2 by FDA under an Emergency Use Authorization (EUA). This EUA will remain  in effect (meaning this test can be used) for the duration of the COVID-19 declaration under Section  56 4(b)(1) of the Act, 21 U.S.C. section 360bbb-3(b)(1), unless the authorization is terminated or revoked sooner. Performed at [facility], [place] [address] [place], [place] [address]   MRSA PCR Screening     Status: None   Collection Time: 12/23/18 11:07 PM   Specimen: Nasal Mucosa; Nasopharyngeal  Result Value Ref Range Status   MRSA by PCR NEGATIVE NEGATIVE Final    Comment:        The GeneXpert MRSA Assay (FDA approved for NASAL specimens only), is one component of a comprehensive MRSA colonization surveillance program. It is not intended to diagnose MRSA infection nor to guide or monitor treatment for MRSA infections. Performed at [facility], [place] [address], [place], [place] [address]      Discharge Instructions:   Discharge Instructions    Diet - low sodium heart healthy   Complete by: As directed    Discharge instructions   Complete by: As directed    Need to titrate lactulose so you have 3-4 BMs/day to rid body of ammonia   Increase activity slowly   Complete by: As directed      Allergies as of 12/24/2018   No Known Allergies     Medication List    TAKE these medications   albuterol 108 (90 Base) MCG/ACT inhaler Commonly known as: VENTOLIN HFA Inhale 2 puffs into the lungs every 6 (six) hours as needed for wheezing or shortness of breath.   diphenhydrAMINE-zinc acetate cream Commonly known as: Benadryl Extra Strength Apply 1 application topically 3 (three) times daily as needed for itching.   doxycycline 100 MG tablet Commonly known as: VIBRA-TABS Take 1 tablet (100 mg total) by mouth every 12 (twelve) hours.   folic acid 1 MG tablet Commonly known as: FOLVITE TAKE 1 TABLET BY MOUTH EVERY DAY   furosemide 20 MG tablet Commonly known as: LASIX Take 1 tablet (20 mg total) by mouth 2 (two) times daily. Start taking on: December 27, 2018 What changed: These instructions start on December 27, 2018. If you are unsure what to do until  then, ask your doctor or other care provider.   ketoconazole 2 % shampoo Commonly known as: NIZORAL Apply 1 application topically 2 (two) times a week.   lactulose 10 GM/15ML solution Commonly known as: CHRONULAC TAKE 45 MILLILITERS BY MOUTH TWICE DAILY What changed: See the new instructions.   multivitamin with minerals Tabs tablet Take 1 tablet by mouth daily.   nadolol 20 MG tablet Commonly known as: CORGARD TAKE 1 TABLET BY MOUTH EVERY DAY   ondansetron 4 MG tablet Commonly known as: ZOFRAN Take 1 tablet (4 mg total) by mouth every 6 (six) hours as needed for nausea.   oxyCODONE 5  MG immediate release tablet Commonly known as: Oxy IR/ROXICODONE Take 1 tablet (5 mg total) by mouth every 4 (four) hours as needed for moderate pain.   pantoprazole 40 MG tablet Commonly known as: PROTONIX Take 1 tablet (40 mg total) by mouth daily.   spironolactone 50 MG tablet Commonly known as: ALDACTONE Take 1 tablet (50 mg total) by mouth 2 (two) times daily. Start taking on: December 27, 2018 What changed: These instructions start on December 27, 2018. If you are unsure what to do until then, ask your doctor or other care provider.   thiamine 100 MG tablet Take 1 tablet (100 mg total) by mouth daily.   triamcinolone 0.025 % ointment Commonly known as: KENALOG Apply 1 application topically 2 (two) times daily as needed (rash).         Time coordinating discharge: 35 min  Signed:  Geradine Girt DO  [facility] 12/24/2018, 10:44 AM

## 2018-12-24 NOTE — Discharge Instructions (Signed)
Cirrhosis    Cirrhosis is long-term (chronic) liver injury. The liver is the body's largest internal organ, and it performs many functions. It converts food into energy, removes toxic material from the blood, makes important proteins, and absorbs necessary vitamins from food.  In cirrhosis, healthy liver cells are replaced by scar tissue. This prevents blood from flowing through the liver, making it difficult for the liver to function. Scarring of the liver cannot be reversed, but treatment can prevent it from getting worse.  What are the causes?  Common causes of this condition are hepatitis C and long-term alcohol abuse. Other causes include:  · Nonalcoholic fatty liver disease. This happens when fat is deposited in the liver by causes other than alcohol.  · Hepatitis B infection.  · Autoimmune hepatitis. In this condition, the body's defense system (immune system) mistakenly attacks the liver cells, causing irritation and swelling (inflammation).  · Diseases that cause blockage of ducts inside the liver.  · Inherited liver diseases, such as hemochromatosis. This is one of the most common inherited liver diseases. In this disease, deposits of iron collect in the liver and other organs.  · Reactions to certain long-term medicines, such as amiodarone, a heart medicine.  · Parasitic infections. These include schistosomiasis, which is caused by a flatworm.  · Long-term contact to certain toxins. These toxins include certain organic solvents, such as toluene and chloroform.  What increases the risk?  You are more likely to develop this condition if:  · You have certain types of viral hepatitis.  · You abuse alcohol, especially if you are male.  · You are overweight.  · You share needles.  · You have unprotected sex with someone who has viral hepatitis.  What are the signs or symptoms?  You may not have any signs and symptoms at first. Symptoms may not develop until the damage to your liver starts to get worse.  Early  symptoms may include:  · Weakness and tiredness (fatigue).  · Changes in sleep patterns or having trouble sleeping.  · Itchiness.  · Tenderness in the right-upper part of your abdomen.  · Weight loss and muscle loss.  · Nausea.  · Loss of appetite.  · Appearance of tiny blood vessels under the skin.  Later symptoms may include:  · Fatigue or weakness that is getting worse.  · Yellow skin and eyes (jaundice).  · Buildup of fluid in the abdomen (ascites). You may notice that your clothes are tight around your waist.  · Weight gain.  · Swelling of the feet and ankles (edema).  · Trouble breathing.  · Easy bruising and bleeding.  · Vomiting blood.  · Black or bloody stool.  · Mental confusion.  How is this diagnosed?  Your health care provider may suspect cirrhosis based on your symptoms and medical history, especially if you have other medical conditions or a history of alcohol abuse. Your health care provider will do a physical exam to feel your liver and to check for signs of cirrhosis. He or she may perform other tests, including:  · Blood tests to check:  ? For hepatitis B or C.  ? Kidney function.  ? Liver function.  · Imaging tests such as:  ? MRI or CT scan to look for changes seen in advanced cirrhosis.  ? Ultrasound to see if normal liver tissue is being replaced by scar tissue.  · A procedure in which a long needle is used to take a sample of liver   tissue to be checked in a lab (biopsy). Liver biopsy can confirm the diagnosis of cirrhosis.  How is this treated?  Treatment for this condition depends on how damaged your liver is and what caused the damage. It may include treating the symptoms of cirrhosis, or treating the underlying causes in order to slow the damage. Treatment may include:  · Making lifestyle changes, such as:  ? Eating a healthy diet. You may need to work with your health care provider or a diet and nutrition specialist (dietitian) to develop an eating plan.  ? Restricting salt  intake.  ? Maintaining a healthy weight.  ? Not abusing drugs or alcohol.  · Taking medicines to:  ? Treat liver infections or other infections.  ? Control itching.  ? Reduce fluid buildup.  ? Reduce certain blood toxins.  ? Reduce risk of bleeding from enlarged blood vessels in the stomach or esophagus (varices).  · Liver transplant. In this procedure, a liver from a donor is used to replace your diseased liver. This is done if cirrhosis has caused liver failure.  Other treatments and procedures may be done depending on the problems that you get from cirrhosis. Common problems include liver-related kidney failure (hepatorenal syndrome).  Follow these instructions at home:    · Take medicines only as told by your health care provider. Do not use medicines that are toxic to your liver. Ask your health care provider before taking any new medicines, including over-the-counter medicines.  · Rest as needed.  · Eat a well-balanced diet. Ask your health care provider or dietitian for more information.  · Limit your salt or water intake, if your health care provider asks you to do this.  · Do not drink alcohol. This is especially important if you are taking acetaminophen.  · Keep all follow-up visits as told by your health care provider. This is important.  Contact a health care provider if you:  · Have fatigue or weakness that is getting worse.  · Develop swelling of the hands, feet, legs, or face.  · Have a fever.  · Develop loss of appetite.  · Have nausea or vomiting.  · Develop jaundice.  · Develop easy bruising or bleeding.  Get help right away if you:  · Vomit bright red blood or a material that looks like coffee grounds.  · Have blood in your stools.  · Notice that your stools appear black and tarry.  · Become confused.  · Have chest pain or trouble breathing.  Summary  · Cirrhosis is chronic liver injury. Liver damage cannot be reversed. Common causes are hepatitis C and long-term alcohol abuse.  · Tests used to  diagnose cirrhosis include blood tests, imaging tests, and liver biopsy.  · Treatment for this condition involves treating the underlying cause. Avoid alcohol, drugs, salt, and medicines that may damage your liver.  · Contact your health care provider if you develop ascites, edema, jaundice, fever, nausea or vomiting, easy bruising or bleeding, or worsening fatigue.  This information is not intended to replace advice given to you by your health care provider. Make sure you discuss any questions you have with your health care provider.  Document Released: 04/14/2005 Document Revised: 08/04/2018 Document Reviewed: 03/04/2017  Elsevier Patient Education © 2020 Elsevier Inc.

## 2018-12-24 NOTE — Progress Notes (Addendum)
0845[name] with hospice nurse [place]. Updated on condition.   0921: Patient refusing entire dose of lactulose. Asking for pain meds, not due at this time.   1001: Went to give patient pain meds, patient sleeping.   1145: Spoke with patient, states he will call his wife and she will pick him up.  1211: Discharge instructions and teaching provided to patient.   30: Spoke with wife Mark [name]. Asking for ambulance services to bring patient home. Will speak with CM.  1325: Call back to wife Mark [name]. Patient to go home via ambulance.   1450: Patient discharged with personal belongings and discharge paperwork via EMS.

## 2018-12-24 NOTE — Progress Notes (Signed)
Patient's spouse requesting [facility] transport. CSW confirmed address is correct and she wil be at home to receive patient.   CSW contacted [facility] for pickup.   Percell Locus [place] LCSW [phone]

## 2018-12-24 NOTE — Progress Notes (Signed)
PT Cancellation Note  Patient Details Name: Mark [name] MRN: [id number] DOB: Sep 08, 1955   Cancelled Treatment:     I reviewed order for PT eval.  Pt angry that he is in the hospital. He is angry that I have heard he has fallen at home - he denies falls.  Pt reports he has all equipment and RW at home.  He says he doesn't need anything but to go home.  Pt reports not wanting to come back to the hospital.  I encouraged him to have wife call hospice and not ambulance if he needs medical attention again.  No PT eval done per pt refusal.  Pt ready to be DCed home with hospice and family assist.   Loyal Buba 12/24/2018, 1:28 PM

## 2018-12-25 LAB — RPR: RPR Ser Ql: NONREACTIVE

## 2018-12-26 LAB — VITAMIN B1: Vitamin B1 (Thiamine): 101.6 nmol/L (ref 66.5–200.0)

## 2018-12-28 LAB — CULTURE, BLOOD (ROUTINE X 2)
Culture: NO GROWTH
Culture: NO GROWTH

## 2018-12-28 NOTE — Telephone Encounter (Signed)
Helene Kelp with [facility] called again to inquire about refill for pt for Vitamin B1 100MG  for 1x daily. Stated pt was put on medication in the hospital and is supposed to continue having it filled by Dr. Volanda Napoleon. Please reach out to Helene Kelp if there is an issue or questions. No response has been received. First request sent in July.  CB#[phone]

## 2018-12-30 NOTE — Telephone Encounter (Signed)
Ok for refill? 

## 2018-12-31 ENCOUNTER — Other Ambulatory Visit: Payer: Self-pay

## 2018-12-31 MED ORDER — VITAMIN B-1 100 MG PO TABS: 100.0000 mg | ORAL_TABLET | Freq: Every day | ORAL | 0 refills | Status: DC

## 2018-12-31 NOTE — Telephone Encounter (Signed)
Rx sent 

## 2018-12-31 NOTE — Telephone Encounter (Signed)
That fine.

## 2019-01-15 ENCOUNTER — Other Ambulatory Visit: Payer: Self-pay

## 2019-01-15 MED ORDER — FUROSEMIDE 20 MG PO TABS: 20.0000 mg | ORAL_TABLET | Freq: Two times a day (BID) | ORAL | 0 refills | Status: DC

## 2019-02-10 ENCOUNTER — Other Ambulatory Visit: Payer: Self-pay | Admitting: Family Medicine

## 2019-02-10 MED ORDER — FOLIC ACID 1 MG PO TABS: 1.0000 mg | ORAL_TABLET | Freq: Every day | ORAL | 1 refills | Status: DC

## 2019-02-10 NOTE — Telephone Encounter (Signed)
folic acid (FOLVITE) 1 MG tablet  [facility] [phone] - [place], [place] AT [address] [phone] (Phone) [phone] (Fax)   Please refill per hospice

## 2019-02-22 ENCOUNTER — Ambulatory Visit: Payer: Medicare Other | Admitting: Internal Medicine

## 2019-03-03 ENCOUNTER — Other Ambulatory Visit: Payer: Self-pay

## 2019-03-03 ENCOUNTER — Telehealth: Payer: Self-pay

## 2019-03-03 MED ORDER — DOXYCYCLINE HYCLATE 100 MG PO TABS: 100.0000 mg | ORAL_TABLET | Freq: Two times a day (BID) | ORAL | 0 refills | Status: DC

## 2019-03-03 NOTE — Telephone Encounter (Signed)
Per Dr. Megan Salon patient can have 1 refill of doxycycline. Call patient and made aware of refill and appointment date/time. Mark [name]

## 2019-03-03 NOTE — Telephone Encounter (Signed)
Patient called requesting appointment due to missing appointment last week. Appointment rescheduled. Patient also requesting refill for Doxycycline to be sent to  [place] [facility] to provider for advise on refill through next appointment.  Mark [name]

## 2019-03-03 NOTE — Telephone Encounter (Signed)
He may have 1 refill of doxycycline if he reschedules the appointment.

## 2019-03-21 ENCOUNTER — Ambulatory Visit: Payer: Medicare Other | Admitting: Internal Medicine

## 2019-03-22 ENCOUNTER — Other Ambulatory Visit: Payer: Self-pay

## 2019-03-22 ENCOUNTER — Ambulatory Visit (INDEPENDENT_AMBULATORY_CARE_PROVIDER_SITE_OTHER): Admitting: Internal Medicine

## 2019-03-22 ENCOUNTER — Encounter: Payer: Self-pay | Admitting: Internal Medicine

## 2019-03-22 DIAGNOSIS — G062 Extradural and subdural abscess, unspecified: Secondary | ICD-10-CM | POA: Diagnosis not present

## 2019-03-22 NOTE — Assessment & Plan Note (Signed)
I talked to them again about the difficulty of knowing when epidural abscess has been cured.  He prefers to stay on doxycycline for now.  I will see him back in 3 months.

## 2019-03-22 NOTE — Progress Notes (Signed)
[facility] for Infectious Disease  Patient Active Problem List   Diagnosis Date Noted  . MRSA bacteremia 09/21/2018    Priority: High  . Epidural abscess 09/20/2018    Priority: High  . Cirrhosis of liver without ascites ([facility])   . Hospice care patient 12/02/2018  . AKI (acute kidney injury) ([place])   . Bacteremia   . Spinal stenosis of lumbar region without neurogenic claudication   . Aspiration pneumonia ([place]) 09/24/2018  . Abscess in epidural space of lumbar spine   . Septic arthritis ([place]) 09/20/2018  . Anemia of chronic disease 09/20/2018  . Hypoalbuminemia 09/20/2018  . Hypoglycemia without diagnosis of diabetes mellitus 09/20/2018  . Hyperkalemia 05/18/2018  . Edentulous 05/11/2018  . Pancytopenia ([place]) 05/10/2018  . CAD (coronary artery disease) 05/10/2018  . Hepatic encephalopathy ([place]) 05/10/2018  . Alcohol abuse 05/10/2018  . GERD (gastroesophageal reflux disease) 05/10/2018  . Duodenal ulcer   . Renal failure   . Acute on chronic anemia   . Hypotension 06/17/2017  . Acute on chronic renal failure ([place]) 06/17/2017  . Hyponatremia 06/17/2017  . Thrombocytopenia ([place]) 06/17/2017  . Leg edema, right 06/17/2017  . Acute metabolic encephalopathy [id number]  . Anemia, iron deficiency   . Benign neoplasm of ascending colon   . Hemorrhoids   . Portal hypertensive gastropathy ([place])   . Gastritis and gastroduodenitis   . Esophageal varices without bleeding ([place])   . H/O: CVA (cerebrovascular accident) 12/01/2013  . ACS (acute coronary syndrome) ([place]) 12/01/2013  . Anasarca 12/01/2013  . Polysubstance abuse ([place]) 12/01/2013  . CKD (chronic kidney disease) stage 3, GFR 30-59 ml/min ([name]) 12/01/2013  . S/P right TKA 05/02/2013  . Murmur 04/14/2013  . Right inguinal hernia 12/26/2010  . Cirrhosis, alcoholic ([place]) [id number]    Patient's Medications  New Prescriptions   No medications on file  Previous Medications   ALBUTEROL (PROVENTIL HFA;VENTOLIN  HFA) 108 (90 BASE) MCG/ACT INHALER    Inhale 2 puffs into the lungs every 6 (six) hours as needed for wheezing or shortness of breath.   DIPHENHYDRAMINE-ZINC ACETATE (BENADRYL EXTRA STRENGTH) CREAM    Apply 1 application topically 3 (three) times daily as needed for itching.   DOXYCYCLINE (VIBRA-TABS) 100 MG TABLET    Take 1 tablet (100 mg total) by mouth every 12 (twelve) hours.   FOLIC ACID (FOLVITE) 1 MG TABLET    Take 1 tablet (1 mg total) by mouth daily.   FUROSEMIDE (LASIX) 20 MG TABLET    Take 1 tablet (20 mg total) by mouth 2 (two) times daily.   IBUPROFEN (ADVIL) 400 MG TABLET    Take 400 mg by mouth every 8 (eight) hours.   KETOCONAZOLE (NIZORAL) 2 % SHAMPOO    Apply 1 application topically 2 (two) times a week.   LACTULOSE (CHRONULAC) 10 GM/15ML SOLUTION    TAKE 45 MILLILITERS BY MOUTH TWICE DAILY   MULTIPLE VITAMIN (MULTIVITAMIN WITH MINERALS) TABS TABLET    Take 1 tablet by mouth daily.   NADOLOL (CORGARD) 20 MG TABLET    TAKE 1 TABLET BY MOUTH EVERY DAY   ONDANSETRON (ZOFRAN) 4 MG TABLET    Take 1 tablet (4 mg total) by mouth every 6 (six) hours as needed for nausea.   OXYCODONE (OXY IR/ROXICODONE) 5 MG IMMEDIATE RELEASE TABLET    Take 1 tablet (5 mg total) by mouth every 4 (four) hours as needed for moderate pain.   PANTOPRAZOLE (PROTONIX) 40 MG TABLET  Take 1 tablet (40 mg total) by mouth daily.   SPIRONOLACTONE (ALDACTONE) 50 MG TABLET    Take 1 tablet (50 mg total) by mouth 2 (two) times daily.   THIAMINE (VITAMIN B-1) 100 MG TABLET    Take 1 tablet (100 mg total) by mouth daily.   THIAMINE 100 MG TABLET    Take 1 tablet (100 mg total) by mouth daily.   TRIAMCINOLONE (KENALOG) 0.025 % OINTMENT    Apply 1 application topically 2 (two) times daily as needed (rash).  Modified Medications   No medications on file  Discontinued Medications   No medications on file    Subjective: Mark [name] is in for a hospital follow-up visit.  He was hospitalized in late May with MRSA  bacteremia, lumbar epidural abscess and acute kidney injury.  He was started on daptomycin.  Repeat blood cultures were negative within 36 hours.  He had no evidence of endocarditis by exam or TTE.  He has been hospitalized in January of this year with MSSA bacteremia and was treated with 2 weeks of IV cefazolin.  He had given a history of polysubstance abuse.  Apparently his home health agency had concerns about ongoing IVDU so during his most recent hospitalization there was concern about replacing a PIC and treating him again as an outpatient.  He refused inpatient rehab and skilled nursing facility placement.  He received 2 weeks of IV daptomycin completing therapy on 10/04/2018 followed by 4 weeks of oral doxycycline.    He was hospitalized again in late August with hepatic encephalopathy and discharged home under hospice care.  He has continued doxycycline.  Review of Systems: Review of Systems  Constitutional: Negative for chills, diaphoresis and fever.  Respiratory: Negative for cough and shortness of breath.   Cardiovascular: Negative for chest pain.  Gastrointestinal: Negative for abdominal pain, diarrhea, nausea and vomiting.  Musculoskeletal: Positive for back pain.    Past Medical History:  Diagnosis Date  . Alcohol abuse   . Anemia   . Arthritis   . Ascites   . Cirrhosis ([place])   . Coffee ground emesis   . Dehydration 06/17/2017  . Febrile illness   . Heart murmur   . Hyperlipidemia   . Hypertension   . Leg swelling   . Myocardial infarction ([place]) 2012  . Preop cardiovascular exam 04/14/2013  . Sepsis ([place]) 06/17/2017  . Septic shock ([place]) 06/18/2017  . SIRS (systemic inflammatory response syndrome) ([place]) 07/11/2017  . Stroke [facility]) 2012   no deficits  . Thrombocytopenia ([name])     Social History   Tobacco Use  . Smoking status: Former Smoker    Packs/day: 0.50    Years: 10.00    Pack years: 5.00    Types: Cigarettes    Quit date: 01/05/2016    Years since quitting:  3.2  . Smokeless tobacco: Never Used  . Tobacco comment: 4 cigarettes a day  Substance Use Topics  . Alcohol use: Yes    Comment: o'dooles   . Drug use: Not Currently    Types: Marijuana, Heroin    Family History  Problem Relation Age of Onset  . Hypertension Father   . Diabetes Father   . Dementia Mother   . Lupus Sister     No Known Allergies  Objective: Vitals:   03/22/19 1148  BP: 123/77  Pulse: 78  Temp: 98 F (36.7 C)  TempSrc: Oral  Weight: 125 lb 12.8 oz (57.1 kg)   Body mass index  is 20.93 kg/m.  Physical Exam Constitutional:      Comments: He is very thin.  Not absolutely certain but documented weights indicate that he is lost 25 pounds in the last several months.  He is accompanied by his daughter.  He is somewhat confused.  Cardiovascular:     Rate and Rhythm: Normal rate and regular rhythm.     Heart sounds: No murmur.  Pulmonary:     Effort: Pulmonary effort is normal.     Breath sounds: Normal breath sounds.  Abdominal:     Palpations: Abdomen is soft.     Tenderness: There is no abdominal tenderness.  Skin:    Comments: Very dry hyperpigmented skin.  Neurological:     Comments: He does not appear to have any focal weakness.     Lab Results Sed Rate  Date Value  11/15/2018 >130 mm/h (H)  09/20/2018 55 mm/hr (H)   CRP  Date Value  11/15/2018 3.5 mg/L  09/20/2018 18.9 mg/dL (H)     Problem List Items Addressed This Visit      High   Epidural abscess    I talked to them again about the difficulty of knowing when epidural abscess has been cured.  He prefers to stay on doxycycline for now.  I will see him back in 3 months.          Michel Bickers, MD [facility] [phone] pager   [phone] cell 03/22/2019, 12:42 PM

## 2019-04-19 ENCOUNTER — Other Ambulatory Visit: Payer: Self-pay | Admitting: Family Medicine

## 2019-04-19 ENCOUNTER — Other Ambulatory Visit: Payer: Self-pay

## 2019-04-19 MED ORDER — PANTOPRAZOLE SODIUM 40 MG PO TBEC: 40.0000 mg | DELAYED_RELEASE_TABLET | Freq: Every day | ORAL | 0 refills | Status: DC

## 2019-05-05 ENCOUNTER — Ambulatory Visit: Payer: Medicare Other | Admitting: Family Medicine

## 2019-05-06 ENCOUNTER — Other Ambulatory Visit: Payer: Self-pay | Admitting: Family Medicine

## 2019-05-11 ENCOUNTER — Encounter: Payer: Self-pay | Admitting: Family Medicine

## 2019-05-11 ENCOUNTER — Other Ambulatory Visit: Payer: Self-pay | Admitting: *Deleted

## 2019-05-11 ENCOUNTER — Telehealth: Payer: Self-pay | Admitting: Family Medicine

## 2019-05-11 DIAGNOSIS — G062 Extradural and subdural abscess, unspecified: Secondary | ICD-10-CM

## 2019-05-11 MED ORDER — DOXYCYCLINE HYCLATE 100 MG PO TABS: 100.0000 mg | ORAL_TABLET | Freq: Two times a day (BID) | ORAL | 1 refills | Status: DC

## 2019-05-11 NOTE — Progress Notes (Signed)
Refill requested by Hall Busing, hospice nurse.

## 2019-05-11 NOTE — Telephone Encounter (Signed)
Mark [name] with [facility] / hospice  is calling in for assistance, she is requesting VO to continue Hospice services if hospice provider says that he is eligible. They will be checking pts CMET and INR to check pt's liver functions   [phone]

## 2019-05-11 NOTE — Telephone Encounter (Signed)
Ok for verbal orders ?

## 2019-05-17 NOTE — Telephone Encounter (Signed)
Spoke with Clarene Critchley with [facility] state that hey checked pt CMET and INR foe Hospice eligibility. State that she will request the results be faxed to [name] for review

## 2019-05-17 NOTE — Telephone Encounter (Signed)
Ok

## 2019-05-19 ENCOUNTER — Other Ambulatory Visit: Payer: Self-pay | Admitting: Family Medicine

## 2019-05-19 DIAGNOSIS — G8929 Other chronic pain: Secondary | ICD-10-CM

## 2019-05-27 ENCOUNTER — Other Ambulatory Visit: Payer: Self-pay

## 2019-06-08 ENCOUNTER — Other Ambulatory Visit: Payer: Self-pay

## 2019-06-09 ENCOUNTER — Ambulatory Visit (INDEPENDENT_AMBULATORY_CARE_PROVIDER_SITE_OTHER): Payer: Medicare (Managed Care) | Admitting: Family Medicine

## 2019-06-09 ENCOUNTER — Encounter: Payer: Self-pay | Admitting: Family Medicine

## 2019-06-09 VITALS — BP 96/60 | HR 80 | Temp 97.9°F | Wt 128.0 lb

## 2019-06-09 DIAGNOSIS — G8929 Other chronic pain: Secondary | ICD-10-CM

## 2019-06-09 DIAGNOSIS — K4090 Unilateral inguinal hernia, without obstruction or gangrene, not specified as recurrent: Secondary | ICD-10-CM | POA: Diagnosis not present

## 2019-06-09 DIAGNOSIS — G062 Extradural and subdural abscess, unspecified: Secondary | ICD-10-CM | POA: Diagnosis not present

## 2019-06-09 NOTE — Patient Instructions (Addendum)
The original referral for pain clinic was denied in January.  A new referral has been placed. Inguinal Hernia, Adult An inguinal hernia is when fat or your intestines push through a weak spot in a muscle where your leg meets your lower belly (groin). This causes a rounded lump (bulge). This kind of hernia could also be:  In your scrotum, if you are male.  In folds of skin around your vagina, if you are male. There are three types of inguinal hernias. These include:  Hernias that can be pushed back into the belly (are reducible). This type rarely causes pain.  Hernias that cannot be pushed back into the belly (are incarcerated).  Hernias that cannot be pushed back into the belly and lose their blood supply (are strangulated). This type needs emergency surgery. If you do not have symptoms, you may not need treatment. If you have symptoms or a large hernia, you may need surgery. Follow these instructions at home: Lifestyle  Do these things if told by your doctor so you do not have trouble pooping (constipation): ? Drink enough fluid to keep your pee (urine) pale yellow. ? Eat foods that have a lot of fiber. These include fresh fruits and vegetables, whole grains, and beans. ? Limit foods that are high in fat and processed sugars. These include foods that are fried or sweet. ? Take medicine for trouble pooping.  Avoid lifting heavy objects.  Avoid standing for long amounts of time.  Do not use any products that contain nicotine or tobacco. These include cigarettes and e-cigarettes. If you need help quitting, ask your doctor.  Stay at a healthy weight. General instructions  You may try to push your hernia in by very gently pressing on it when you are lying down. Do not try to force the bulge back in if it will not push in easily.  Watch your hernia for any changes in shape, size, or color. Tell your doctor if you see any changes.  Take over-the-counter and prescription medicines  only as told by your doctor.  Keep all follow-up visits as told by your doctor. This is important. Contact a doctor if:  You have a fever.  You have new symptoms.  Your symptoms get worse. Get help right away if:  The area where your leg meets your lower belly has: ? Pain that gets worse suddenly. ? A bulge that gets bigger suddenly, and it does not get smaller after that. ? A bulge that turns red or purple. ? A bulge that is painful when you touch it.  You are a man, and your scrotum: ? Suddenly feels painful. ? Suddenly changes in size.  You cannot push the hernia in by very gently pressing on it when you are lying down. Do not try to force the bulge back in if it will not push in easily.  You feel sick to your stomach (nauseous), and that feeling does not go away.  You throw up (vomit), and that keeps happening.  You have a fast heartbeat.  You cannot poop (have a bowel movement) or pass gas. These symptoms may be an emergency. Do not wait to see if the symptoms will go away. Get medical help right away. Call your local emergency services (911 in the [place] Summary  An inguinal hernia is when fat or your intestines push through a weak spot in a muscle where your leg meets your lower belly (groin). This causes a rounded lump (bulge).  If you do  not have symptoms, you may not need treatment. If you have symptoms or a large hernia, you may need surgery.  Avoid lifting heavy objects. Also avoid standing for long amounts of time.  Do not try to force the bulge back in if it will not push in easily. This information is not intended to replace advice given to you by your health care provider. Make sure you discuss any questions you have with your health care provider. Document Revised: 05/16/2017 Document Reviewed: 01/14/2017 Elsevier Patient Education  [place].  Chronic Pain, Adult Chronic pain is a type of pain that lasts or keeps coming back (recurs) for at  least six months. You may have chronic headaches, abdominal pain, or body pain. Chronic pain may be related to an illness, such as fibromyalgia or complex regional pain syndrome. Sometimes the cause of chronic pain is not known. Chronic pain can make it hard for you to do daily activities. If not treated, chronic pain can lead to other health problems, including anxiety and depression. Treatment depends on the cause and severity of your pain. You may need to work with a pain specialist to come up with a treatment plan. The plan may include medicine, counseling, and physical therapy. Many people benefit from a combination of two or more types of treatment to control their pain. Follow these instructions at home: Lifestyle  Consider keeping a pain diary to share with your health care providers.  Consider talking with a mental health care provider (psychologist) about how to cope with chronic pain.  Consider joining a chronic pain support group.  Try to control or lower your stress levels. Talk to your health care provider about strategies to do this. General instructions   Take over-the-counter and prescription medicines only as told by your health care provider.  Follow your treatment plan as told by your health care provider. This may include: ? Gentle, regular exercise. ? Eating a healthy diet that includes foods such as vegetables, fruits, fish, and lean meats. ? Cognitive or behavioral therapy. ? Working with a Community education officer. ? Meditation or yoga. ? Acupuncture or massage therapy. ? Aroma, color, light, or sound therapy. ? Local electrical stimulation. ? Shots (injections) of numbing or pain-relieving medicines into the spine or the area of pain.  Check your pain level as told by your health care provider. Ask your health care provider if you should use a pain scale.  Learn as much as you can about how to manage your chronic pain. Ask your health care provider if an intensive  pain rehabilitation program or a chronic pain specialist would be helpful.  Keep all follow-up visits as told by your health care provider. This is important. Contact a health care provider if:  Your pain gets worse.  You have new pain.  You have trouble sleeping.  You have trouble doing your normal activities.  Your pain is not controlled with treatment.  Your have side effects from pain medicine.  You feel weak. Get help right away if:  You lose feeling or have numbness in your body.  You lose control of bowel or bladder function.  Your pain suddenly gets much worse.  You develop shaking or chills.  You develop confusion.  You develop chest pain.  You have trouble breathing or shortness of breath.  You pass out.  You have thoughts about hurting yourself or others. This information is not intended to replace advice given to you by your health care provider. Make  sure you discuss any questions you have with your health care provider. Document Revised: 03/27/2017 Document Reviewed: 10/02/2015 Elsevier Patient Education  [name].

## 2019-06-10 ENCOUNTER — Other Ambulatory Visit: Payer: Self-pay | Admitting: Family Medicine

## 2019-06-11 ENCOUNTER — Encounter: Payer: Self-pay | Admitting: Family Medicine

## 2019-06-11 NOTE — Progress Notes (Signed)
Subjective:    Patient ID: Mark [name], male    DOB: 05-08-1955, 64 y.o.   MRN: [id number]  No chief complaint on file. Pt accompanied by his wife.  HPI Pt is a 64 yo male with pmh for alcoholic cirrhosis of liver, chronic pain, h/o epidural abscess, septic arthritis, HTN, h/o MI, h/o CVA, polysubstance abuse, HLD, GERD, anemia, portal HTN, CKD 3, spinal stenosis, h/o R inguinal hernia s/p repair who was seen today for f/u and ongoing concern.  Pt endorses pain and recurring bulge in L groin similar to when he had a R inguinal hernia.  Bulge is reducible. May occur after working in the yard.  Pt recently released from hospice care.  Pt was receiving pain meds through hospice and is concerned about running out of medications.  Pt hospitalized several times last year for MRSA bacteremia with lumbar epidural abscess.  Pt has questions about continuing doxycycline for h/o epidural abscess. Followed by ID, Dr. Megan Salon.  Pt hesitant to stop medication as abscess may reoccur.  Past Medical History:  Diagnosis Date  . Alcohol abuse   . Anemia   . Arthritis   . Ascites   . Cirrhosis ([place])   . Coffee ground emesis   . Dehydration 06/17/2017  . Febrile illness   . Heart murmur   . Hyperlipidemia   . Hypertension   . Leg swelling   . Myocardial infarction ([place]) 2012  . Preop cardiovascular exam 04/14/2013  . Sepsis ([place]) 06/17/2017  . Septic shock ([place]) 06/18/2017  . SIRS (systemic inflammatory response syndrome) ([place]) 07/11/2017  . Stroke [facility]) 2012   no deficits  . Thrombocytopenia ([name])     No Known Allergies  ROS General: Denies fever, chills, night sweats, changes in weight, changes in appetite HEENT: Denies headaches, ear pain, changes in vision, rhinorrhea, sore throat CV: Denies CP, palpitations, SOB, orthopnea Pulm: Denies SOB, cough, wheezing GI: Denies abdominal pain, nausea, vomiting, diarrhea, constipation GU: Denies dysuria, hematuria, frequency Msk: Denies muscle  cramps, joint pains  + back pain, +L inguinal pain Neuro: Denies weakness, numbness, tingling Skin: Denies rashes, bruising Psych: Denies depression, anxiety, hallucinations      Objective:    Blood pressure 96/60, pulse 80, temperature 97.9 F (36.6 C), weight 128 lb (58.1 kg), SpO2 99 %.   Gen. Pleasant, well-nourished, in no distress, normal affect  HEENT: [place]/AT, face symmetric, no scleral icterus, PERRLA, nares patent without drainage Lungs: no accessory muscle use, CTAB, no wheezes or rales Cardiovascular: RRR, no m/r/g, no peripheral edema Neuro:  A&Ox3, CN II-XII intact, ambulating with a cane Skin:  Warm, no lesions/ rash  Wt Readings from Last 3 Encounters:  06/09/19 128 lb (58.1 kg)  03/22/19 125 lb 12.8 oz (57.1 kg)  12/24/18 147 lb 4.3 oz (66.8 kg)    Lab Results  Component Value Date   WBC 4.6 12/24/2018   HGB 7.6 (L) 12/24/2018   HCT 22.2 (L) 12/24/2018   PLT 97 (L) 12/24/2018   GLUCOSE 95 12/24/2018   CHOL 84 12/01/2013   TRIG 58 12/01/2013   HDL 26 (L) 12/01/2013   LDLCALC 46 12/01/2013   ALT 11 12/23/2018   AST 24 12/23/2018   NA 140 12/24/2018   K 3.5 12/24/2018   CL 117 (H) 12/24/2018   CREATININE 2.42 (H) 12/24/2018   BUN 34 (H) 12/24/2018   CO2 12 (L) 12/24/2018   TSH 0.806 12/23/2018   INR 1.2 12/22/2018   HGBA1C  11/01/2009  5.1 (NOTE)                                                                       According to the ADA Clinical Practice Recommendations for 2011, when HbA1c is used as a screening test:   >=6.5%   Diagnostic of Diabetes Mellitus           (if abnormal result  is confirmed)  5.7-6.4%   Increased risk of developing Diabetes Mellitus  References:Diagnosis and Classification of Diabetes Mellitus,Diabetes [id number] 1):S62-S69 and Standards of Medical Care in         Diabetes - 2011,Diabetes [id number]  (Suppl 1):S11-S61.   Of note: per CMA, upon entering the room to give pt AVS, pt found rummaging through exam  room drawers.  Assessment/Plan:  Left inguinal hernia  -h/o R inguinal hernia s/p repair, now with similar symptoms on L - Plan: Ambulatory referral to General Surgery  Other chronic pain  -given pt's h/o and amount of pain medications previously required this provider is not comfortable managing pt's pain. -if needed will supply very limited amount of medication while waiting on referral. - Plan: Ambulatory referral to Pain Clinic  Epidural abscess -continue doxycyline  -continue f/u with ID, Dr. Megan Salon  F/u prn  Grier Mitts, MD

## 2019-06-14 ENCOUNTER — Telehealth: Payer: Self-pay | Admitting: Family Medicine

## 2019-06-14 NOTE — Telephone Encounter (Signed)
Medication Refill: Oxycodone Pharmacy: [facility]  Phone:    Pt said that when hospice released him they provided a few pain pills to last him.He said social services came by this morning and took his pills and now he is in pain and needs his prescription filled as soon as possible. Pt states he was getting a month supply.

## 2019-06-14 NOTE — Telephone Encounter (Signed)
Pt called again to see if this was filled. Informed pt that Mark [name] have been seeing pts and has not had a moment to review the note. Pt stated he would like a call once it has been filled.   Pt can be reached at [phone]

## 2019-06-15 NOTE — Telephone Encounter (Signed)
Patient has called back for the refill to be called in.  He states he is in a lot of pain and wanted me to send another message back.

## 2019-06-15 NOTE — Telephone Encounter (Signed)
Please advise 

## 2019-06-16 NOTE — Telephone Encounter (Signed)
Called pt left detailed message regarding pain medication refill, advised pt to call the office and let us know if its ok to place another referral to pain management since the previous referral was denied

## 2019-06-16 NOTE — Telephone Encounter (Signed)
Per database review and previous discussion with Hospice, pt was given rx for Oxycodone 10 mg weekly.  He was given a months supply on 05/22/18 at time of d/c from Hospice until he could get in with pain management.  Pt should not be out of meds as was given 180 pills on 05/22/18.  At last OFV pt stated he took med prn, so he definitely should not be out of meds.  Unclear as to what happened this am as pt mentioned "social services took his meds this morning"?  Referral for pain management declined.  Will f/u with that referral and place another if needed.

## 2019-06-17 NOTE — Telephone Encounter (Signed)
Pt was notified of [name] recommendation, pt verbalized understanding

## 2019-06-17 NOTE — Telephone Encounter (Signed)
If someone "took" pt's pain medication he should file a police report.  Be advised stolen or misplaced pain prescriptions will not be refilled early.

## 2019-06-17 NOTE — Telephone Encounter (Signed)
FYI Spoke with pt advised to call [facility] to check if his referral has been approved and to check how soon they can scheduled his appointment, Pt insisted that he needs refill on the Rx and that the two ladies from social services who took his pain medications told him that [name] should continue with his care and medication management.Referral was sent this morning by our office referral coordinator, pt verbalized understanding

## 2019-06-21 ENCOUNTER — Telehealth: Payer: Self-pay | Admitting: Family Medicine

## 2019-06-21 NOTE — Telephone Encounter (Signed)
Pt is requesting a refill on oxyCODONE(OXY IR/ROXICODONE) 10 MG sent to [facility] on [address]. Pt is requesting a call when this has been called in to the pharmacy. Thanks

## 2019-06-21 NOTE — Telephone Encounter (Signed)
  Pt has been calling the office requesting for refills on Rx Oxycodone, pt was previously advised to file police report on the person who took his pain medications, advised that this is a control substance and that Rx would not be approved for refill since the database shows that pt was prescribed enough with refills.Pt does not seem to understand. Please advise

## 2019-06-22 ENCOUNTER — Other Ambulatory Visit: Payer: Self-pay

## 2019-06-22 ENCOUNTER — Ambulatory Visit: Payer: Medicare Other | Admitting: Internal Medicine

## 2019-06-23 ENCOUNTER — Telehealth: Payer: Self-pay | Admitting: Family Medicine

## 2019-06-23 ENCOUNTER — Telehealth: Payer: Self-pay

## 2019-06-23 NOTE — Telephone Encounter (Signed)
Patient would like for Brittney to call him as soon as possible that it's very important that she contacts him today.  Please advise

## 2019-06-23 NOTE — Telephone Encounter (Signed)
Spoke with pt advised that his pain clinic appointment is scheduled for  06/29/2019 at 2.45 pm with [facility]. Pt verbalized understanding

## 2019-06-28 ENCOUNTER — Telehealth: Payer: Self-pay | Admitting: Family Medicine

## 2019-06-28 NOTE — Telephone Encounter (Signed)
Pt would like a call back about his medication as soon as possible.

## 2019-06-30 ENCOUNTER — Ambulatory Visit (INDEPENDENT_AMBULATORY_CARE_PROVIDER_SITE_OTHER): Payer: Medicare HMO | Admitting: Internal Medicine

## 2019-06-30 ENCOUNTER — Encounter: Payer: Self-pay | Admitting: Internal Medicine

## 2019-06-30 ENCOUNTER — Other Ambulatory Visit: Payer: Self-pay

## 2019-06-30 DIAGNOSIS — G062 Extradural and subdural abscess, unspecified: Secondary | ICD-10-CM | POA: Diagnosis not present

## 2019-06-30 NOTE — Assessment & Plan Note (Signed)
I went over with him again the reality that the only way to ever know if his MRSA infection has been cured is to stop doxycycline completely and see what happens.  He seems frustrated that we cannot do a simple test to determine if he has been cured.  I asked him to make a decision to go one way or the other, either take full dose doxycycline or stop completely.  I will recheck his inflammatory markers and see him back in 6 weeks.  I told him that I would not be no one to manage his chronic pain.

## 2019-06-30 NOTE — Progress Notes (Signed)
[facility] for Infectious Disease  Patient Active Problem List   Diagnosis Date Noted  . MRSA bacteremia 09/21/2018    Priority: High  . Epidural abscess 09/20/2018    Priority: High  . Cirrhosis of liver without ascites ([place])   . AKI (acute kidney injury) ([place])   . Bacteremia   . Spinal stenosis of lumbar region without neurogenic claudication   . Aspiration pneumonia ([place]) 09/24/2018  . Abscess in epidural space of lumbar spine   . Septic arthritis ([place]) 09/20/2018  . Anemia of chronic disease 09/20/2018  . Hypoalbuminemia 09/20/2018  . Hypoglycemia without diagnosis of diabetes mellitus 09/20/2018  . Hyperkalemia 05/18/2018  . Edentulous 05/11/2018  . Pancytopenia ([place]) 05/10/2018  . CAD (coronary artery disease) 05/10/2018  . Hepatic encephalopathy ([place]) 05/10/2018  . Alcohol abuse 05/10/2018  . GERD (gastroesophageal reflux disease) 05/10/2018  . Duodenal ulcer   . Renal failure   . Acute on chronic anemia   . Hypotension 06/17/2017  . Acute on chronic renal failure ([place]) 06/17/2017  . Hyponatremia 06/17/2017  . Thrombocytopenia ([place]) 06/17/2017  . Leg edema, right 06/17/2017  . Acute metabolic encephalopathy 28/76/8115  . Anemia, iron deficiency   . Benign neoplasm of ascending colon   . Hemorrhoids   . Portal hypertensive gastropathy ([place])   . Gastritis and gastroduodenitis   . Esophageal varices without bleeding ([place])   . H/O: CVA (cerebrovascular accident) 12/01/2013  . ACS (acute coronary syndrome) ([place]) 12/01/2013  . Anasarca 12/01/2013  . Polysubstance abuse ([place]) 12/01/2013  . CKD (chronic kidney disease) stage 3, GFR 30-59 ml/min ([name]) 12/01/2013  . S/P right TKA 05/02/2013  . Murmur 04/14/2013  . Right inguinal hernia 12/26/2010  . Cirrhosis, alcoholic ([place]) 72/62/0355    Patient's Medications  New Prescriptions   No medications on file  Previous Medications   ALBUTEROL (PROVENTIL HFA;VENTOLIN HFA) 108 (90 BASE) MCG/ACT INHALER     Inhale 2 puffs into the lungs every 6 (six) hours as needed for wheezing or shortness of breath.   DIPHENHYDRAMINE-ZINC ACETATE (BENADRYL EXTRA STRENGTH) CREAM    Apply 1 application topically 3 (three) times daily as needed for itching.   DOXYCYCLINE (VIBRA-TABS) 100 MG TABLET    Take 1 tablet (100 mg total) by mouth every 12 (twelve) hours.   FOLIC ACID (FOLVITE) 1 MG TABLET    TAKE 1 TABLET(1 MG) BY MOUTH DAILY   FUROSEMIDE (LASIX) 20 MG TABLET    Take 1 tablet (20 mg total) by mouth 2 (two) times daily.   IBUPROFEN (ADVIL) 400 MG TABLET    Take 400 mg by mouth every 8 (eight) hours.   KETOCONAZOLE (NIZORAL) 2 % SHAMPOO    Apply 1 application topically 2 (two) times a week.   LACTULOSE (CHRONULAC) 10 GM/15ML SOLUTION    TAKE 45 MILLILITERS BY MOUTH TWICE DAILY   MULTIPLE VITAMIN (MULTIVITAMIN WITH MINERALS) TABS TABLET    Take 1 tablet by mouth daily.   NADOLOL (CORGARD) 20 MG TABLET    TAKE 1 TABLET BY MOUTH EVERY DAY   ONDANSETRON (ZOFRAN) 4 MG TABLET    Take 1 tablet (4 mg total) by mouth every 6 (six) hours as needed for nausea.   OXYCODONE (OXY IR/ROXICODONE) 5 MG IMMEDIATE RELEASE TABLET    Take 1 tablet (5 mg total) by mouth every 4 (four) hours as needed for moderate pain.   PANTOPRAZOLE (PROTONIX) 40 MG TABLET    Take 1 tablet (40 mg  total) by mouth daily.   SPIRONOLACTONE (ALDACTONE) 50 MG TABLET    Take 1 tablet (50 mg total) by mouth 2 (two) times daily.   THIAMINE (VITAMIN B-1) 100 MG TABLET    Take 1 tablet (100 mg total) by mouth daily.   THIAMINE 100 MG TABLET    Take 1 tablet (100 mg total) by mouth daily.   TRIAMCINOLONE (KENALOG) 0.025 % OINTMENT    Apply 1 application topically 2 (two) times daily as needed (rash).  Modified Medications   No medications on file  Discontinued Medications   No medications on file    Subjective: Mark [name] is in for a routine follow-up visit.  Mark [name] was hospitalized in late May with MRSA bacteremia, lumbar epidural abscess and acute kidney  injury.  Mark [name] was started on daptomycin.  Repeat blood cultures were negative within 36 hours.  Mark [name] had no evidence of endocarditis by exam or TTE.  Mark [name] has been hospitalized in January of this year with MSSA bacteremia and was treated with 2 weeks of IV cefazolin.  Mark [name] had given a history of polysubstance abuse.  Apparently his home health agency had concerns about ongoing IVDU so during his most recent hospitalization there was concern about replacing a PIC and treating him again as an outpatient.  Mark [name] refused inpatient rehab and skilled nursing facility placement.  Mark [name] received 2 weeks of IV daptomycin completing therapy on 10/04/2018 followed by 4 weeks of oral doxycycline.  Mark [name] was hospitalized again in late August with hepatic encephalopathy and discharged home under hospice care.  Mark [name] has continued doxycycline.  Mark [name] met with his PCP, Dr. Grier Mitts, on 06/09/2019.  She indicated that Mark [name] would continue doxycycline and follow-up with me.  Somehow interpreted that as it might be dangerous for him to continue taking the doxycycline.  Mark [name] says that Mark [name] started taking a smaller dose.  Mark [name] says that Mark [name] has cut down to where Mark [name] is only taking 1 tablet about 3 days out of 7.  Mark [name] has not noted any change in his pain.  However Mark [name] does ask me for pain medication.  Mark [name] is scheduled to be seen in a pain clinic.  Mark [name] has not been having any problems tolerating doxycycline before Mark [name] decided to cut down.  Review of Systems: Review of Systems  Constitutional: Negative for chills, diaphoresis and fever.  Respiratory: Negative for cough and shortness of breath.   Cardiovascular: Negative for chest pain.  Gastrointestinal: Negative for abdominal pain, diarrhea, nausea and vomiting.  Musculoskeletal: Positive for back pain and joint pain.    Past Medical History:  Diagnosis Date  . Alcohol abuse   . Anemia   . Arthritis   . Ascites   . Cirrhosis ([place])   . Coffee ground emesis   . Dehydration 06/17/2017  . Febrile illness     . Heart murmur   . Hyperlipidemia   . Hypertension   . Leg swelling   . Myocardial infarction ([place]) 2012  . Preop cardiovascular exam 04/14/2013  . Sepsis ([place]) 06/17/2017  . Septic shock ([place]) 06/18/2017  . SIRS (systemic inflammatory response syndrome) ([place]) 07/11/2017  . Stroke [facility]) 2012   no deficits  . Thrombocytopenia ([name])     Social History   Tobacco Use  . Smoking status: Former Smoker    Packs/day: 0.50    Years: 10.00    Pack years: 5.00    Types: Cigarettes    Quit date: 01/05/2016    Years since quitting:  3.4  . Smokeless tobacco: Never Used  . Tobacco comment: 4 cigarettes a day  Substance Use Topics  . Alcohol use: Yes    Comment: o'dooles   . Drug use: Not Currently    Types: Marijuana, Heroin    Family History  Problem Relation Age of Onset  . Hypertension Father   . Diabetes Father   . Dementia Mother   . Lupus Sister     No Known Allergies  Objective: Vitals:   06/30/19 1437  BP: 123/72  Pulse: (!) 101  Temp: 97.7 F (36.5 C)  TempSrc: Oral  Weight: 132 lb (59.9 kg)   Body mass index is 21.97 kg/m.  Physical Exam Constitutional:      Comments: Mark [name] remains very thin but his weight is up 4 pounds since his last visit.  His conversation is somewhat rambling and argumentative.  Mark [name] may be slightly confused.  Cardiovascular:     Rate and Rhythm: Normal rate and regular rhythm.     Heart sounds: No murmur.  Pulmonary:     Effort: Pulmonary effort is normal.     Breath sounds: Normal breath sounds.  Abdominal:     Palpations: Abdomen is soft.     Tenderness: There is no abdominal tenderness.  Musculoskeletal:     Comments: No kyphotic deformity of the spine noted.  Skin:    Comments: Very dry hyperpigmented skin.  Neurological:     Comments: Mark [name] does not appear to have any focal weakness.  Mark [name] walks slowly with the aid of a cane.     Lab Results Sed Rate  Date Value  11/15/2018 >130 mm/h (H)  09/20/2018 55 mm/hr (H)   CRP  Date  Value  11/15/2018 3.5 mg/L  09/20/2018 18.9 mg/dL (H)     Problem List Items Addressed This Visit      High   Epidural abscess    I went over with him again the reality that the only way to ever know if his MRSA infection has been cured is to stop doxycycline completely and see what happens.  Mark [name] seems frustrated that we cannot do a simple test to determine if Mark [name] has been cured.  I asked him to make a decision to go one way or the other, either take full dose doxycycline or stop completely.  I will recheck his inflammatory markers and see him back in 6 weeks.  I told him that I would not be no one to manage his chronic pain.      Relevant Orders   C-reactive protein   Sedimentation rate       Michel Bickers, MD [facility] for Infectious [place] [phone] pager   [phone] cell 06/30/2019, 3:17 PM

## 2019-06-30 NOTE — Telephone Encounter (Signed)
Spoke to pt and he stated that the hospice Dr. Reubin Milan prescribe any pain medication. Pt has an appt. 3/9. Pt stated he is hurting really bad. Wants to know if he can get some pain med. Until his appt. With the pain clinic.

## 2019-07-01 ENCOUNTER — Telehealth: Payer: Self-pay

## 2019-07-01 LAB — SEDIMENTATION RATE

## 2019-07-01 LAB — C-REACTIVE PROTEIN: CRP: 24 mg/L — ABNORMAL HIGH (ref <8.0)

## 2019-07-01 LAB — SED RATE MANUAL WEST RFLX: SED RATE BY MODIFIED WESTERGREN,MANUAL: 125 mm/h — ABNORMAL HIGH (ref 0–20)

## 2019-07-01 NOTE — Telephone Encounter (Signed)
-----   Message from Mark Bickers, MD sent at 07/01/2019  8:53 AM EST ----- Please call [name] and let him know that his sed rate and C-reactive protein are still elevated suggesting that his MRSA spine infection may still be active.  I would suggest continuing full dose doxycycline for now.

## 2019-07-01 NOTE — Telephone Encounter (Signed)
Patient called and notified regarding lab results. Understands he should continue with full dose of doxy at this time. Will follow up as planned. Does not have any questions at this time. [place]

## 2019-07-01 NOTE — Telephone Encounter (Signed)
Matter previously addressed. 

## 2019-07-08 ENCOUNTER — Other Ambulatory Visit: Payer: Medicare HMO

## 2019-07-08 ENCOUNTER — Other Ambulatory Visit: Payer: Self-pay

## 2019-07-08 DIAGNOSIS — G062 Extradural and subdural abscess, unspecified: Secondary | ICD-10-CM

## 2019-07-08 LAB — SEDIMENTATION RATE

## 2019-07-08 LAB — SED RATE MANUAL WEST RFLX: SED RATE BY MODIFIED WESTERGREN,MANUAL: >130 mm/h — ABNORMAL HIGH (ref 0–20)

## 2019-07-19 ENCOUNTER — Other Ambulatory Visit: Payer: Self-pay

## 2019-07-19 ENCOUNTER — Telehealth: Payer: Self-pay | Admitting: *Deleted

## 2019-07-19 DIAGNOSIS — G062 Extradural and subdural abscess, unspecified: Secondary | ICD-10-CM

## 2019-07-19 DIAGNOSIS — E7220 Disorder of urea cycle metabolism, unspecified: Secondary | ICD-10-CM

## 2019-07-19 MED ORDER — DOXYCYCLINE HYCLATE 100 MG PO TABS: 100.0000 mg | ORAL_TABLET | Freq: Two times a day (BID) | ORAL | 0 refills | Status: DC

## 2019-07-19 NOTE — Telephone Encounter (Signed)
Received fax request from [facility] for patient's doxycycline.  RN sent in 30 day fill per Dr Hale Bogus last phone note instructing patient to continue with antibiotics until his next appointment. Landis Gandy, RN

## 2019-07-21 ENCOUNTER — Telehealth: Payer: Self-pay

## 2019-07-21 NOTE — Telephone Encounter (Signed)
Patient called to hear about lab results. Reports that he is taking his antibiotics as instructed. Informed patient that RN would reach out to provider and call back with recommendations.   Mark Garlitz Lorita Officer, RN

## 2019-07-21 NOTE — Telephone Encounter (Signed)
Please let him know that his inflammatory markers remain elevated.  I would like him to continue taking doxycycline.

## 2019-07-21 NOTE — Telephone Encounter (Signed)
Called patient to inform him that per Dr. Megan Salon, labs show that his inflammatory markers remain elevated and he would like the patient to continue to take his antibiotics. Patient verbalized understanding.  Leaha Cuervo Lorita Officer, RN

## 2019-07-26 ENCOUNTER — Other Ambulatory Visit: Payer: Self-pay

## 2019-07-26 ENCOUNTER — Telehealth: Payer: Self-pay

## 2019-07-26 DIAGNOSIS — E7220 Disorder of urea cycle metabolism, unspecified: Secondary | ICD-10-CM

## 2019-07-26 MED ORDER — SPIRONOLACTONE 50 MG PO TABS: 50.0000 mg | ORAL_TABLET | Freq: Two times a day (BID) | ORAL | 0 refills | Status: DC

## 2019-07-26 MED ORDER — PANTOPRAZOLE SODIUM 40 MG PO TBEC: 40.0000 mg | DELAYED_RELEASE_TABLET | Freq: Every day | ORAL | 0 refills | Status: DC

## 2019-07-26 MED ORDER — LACTULOSE 10 GM/15ML PO SOLN: ORAL | 3 refills | Status: DC

## 2019-07-26 MED ORDER — FOLIC ACID 1 MG PO TABS: ORAL_TABLET | ORAL | 0 refills | Status: DC

## 2019-07-26 NOTE — Telephone Encounter (Signed)
Attempted to call pt for clarification on Lasix, Rx states that pt stopped taking, no option to leave a message

## 2019-08-17 ENCOUNTER — Other Ambulatory Visit: Payer: Self-pay

## 2019-08-17 ENCOUNTER — Encounter: Payer: Self-pay | Admitting: Internal Medicine

## 2019-08-17 ENCOUNTER — Ambulatory Visit (INDEPENDENT_AMBULATORY_CARE_PROVIDER_SITE_OTHER): Payer: Medicare HMO | Admitting: Internal Medicine

## 2019-08-17 DIAGNOSIS — G062 Extradural and subdural abscess, unspecified: Secondary | ICD-10-CM

## 2019-08-17 NOTE — Progress Notes (Signed)
[facility] for Infectious Disease  Patient Active Problem List   Diagnosis Date Noted  . MRSA bacteremia 09/21/2018    Priority: High  . Epidural abscess 09/20/2018    Priority: High  . Cirrhosis of liver without ascites ([place])   . AKI (acute kidney injury) ([place])   . Bacteremia   . Spinal stenosis of lumbar region without neurogenic claudication   . Aspiration pneumonia ([place]) 09/24/2018  . Abscess in epidural space of lumbar spine   . Septic arthritis ([place]) 09/20/2018  . Anemia of chronic disease 09/20/2018  . Hypoalbuminemia 09/20/2018  . Hypoglycemia without diagnosis of diabetes mellitus 09/20/2018  . Hyperkalemia 05/18/2018  . Edentulous 05/11/2018  . Pancytopenia ([place]) 05/10/2018  . CAD (coronary artery disease) 05/10/2018  . Hepatic encephalopathy ([place]) 05/10/2018  . Alcohol abuse 05/10/2018  . GERD (gastroesophageal reflux disease) 05/10/2018  . Duodenal ulcer   . Renal failure   . Acute on chronic anemia   . Hypotension 06/17/2017  . Acute on chronic renal failure ([place]) 06/17/2017  . Hyponatremia 06/17/2017  . Thrombocytopenia ([place]) 06/17/2017  . Leg edema, right 06/17/2017  . Acute metabolic encephalopathy [id number]  . Anemia, iron deficiency   . Benign neoplasm of ascending colon   . Hemorrhoids   . Portal hypertensive gastropathy ([place])   . Gastritis and gastroduodenitis   . Esophageal varices without bleeding ([place])   . H/O: CVA (cerebrovascular accident) 12/01/2013  . ACS (acute coronary syndrome) ([place]) 12/01/2013  . Anasarca 12/01/2013  . Polysubstance abuse ([place]) 12/01/2013  . CKD (chronic kidney disease) stage 3, GFR 30-59 ml/min ([name]) 12/01/2013  . S/P right TKA 05/02/2013  . Murmur 04/14/2013  . Right inguinal hernia 12/26/2010  . Cirrhosis, alcoholic ([place]) [id number]    Patient's Medications  New Prescriptions   No medications on file  Previous Medications   ALBUTEROL (PROVENTIL HFA;VENTOLIN HFA) 108 (90 BASE) MCG/ACT INHALER     Inhale 2 puffs into the lungs every 6 (six) hours as needed for wheezing or shortness of breath.   DIPHENHYDRAMINE-ZINC ACETATE (BENADRYL EXTRA STRENGTH) CREAM    Apply 1 application topically 3 (three) times daily as needed for itching.   DOXYCYCLINE (VIBRA-TABS) 100 MG TABLET    Take 1 tablet (100 mg total) by mouth every 12 (twelve) hours.   FOLIC ACID (FOLVITE) 1 MG TABLET    TAKE 1 TABLET(1 MG) BY MOUTH DAILY   FUROSEMIDE (LASIX) 20 MG TABLET    Take 1 tablet (20 mg total) by mouth 2 (two) times daily.   IBUPROFEN (ADVIL) 400 MG TABLET    Take 400 mg by mouth every 8 (eight) hours.   KETOCONAZOLE (NIZORAL) 2 % SHAMPOO    Apply 1 application topically 2 (two) times a week.   LACTULOSE (CHRONULAC) 10 GM/15ML SOLUTION    TAKE 45 MILLILITERS BY MOUTH TWICE DAILY   MULTIPLE VITAMIN (MULTIVITAMIN WITH MINERALS) TABS TABLET    Take 1 tablet by mouth daily.   NADOLOL (CORGARD) 20 MG TABLET    TAKE 1 TABLET BY MOUTH EVERY DAY   ONDANSETRON (ZOFRAN) 4 MG TABLET    Take 1 tablet (4 mg total) by mouth every 6 (six) hours as needed for nausea.   OXYCODONE (OXY IR/ROXICODONE) 5 MG IMMEDIATE RELEASE TABLET    Take 1 tablet (5 mg total) by mouth every 4 (four) hours as needed for moderate pain.   PANTOPRAZOLE (PROTONIX) 40 MG TABLET    Take 1 tablet (40 mg total)  by mouth daily.   SPIRONOLACTONE (ALDACTONE) 50 MG TABLET    Take 1 tablet (50 mg total) by mouth 2 (two) times daily.   THIAMINE (VITAMIN B-1) 100 MG TABLET    Take 1 tablet (100 mg total) by mouth daily.   THIAMINE 100 MG TABLET    Take 1 tablet (100 mg total) by mouth daily.   TRIAMCINOLONE (KENALOG) 0.025 % OINTMENT    Apply 1 application topically 2 (two) times daily as needed (rash).  Modified Medications   No medications on file  Discontinued Medications   No medications on file    Subjective: Mark [name] is in for his routine follow-up visit.  He has had recurrent bacteremia.  He had group A strep bacteremia in February 2019, MSSA bacteremia  in January 2020 and MRSA bacteremia in May 2020.  His last bacteremia was complicated by thoracic spine infection.  He has been taking chronic doxycycline ever since that hospitalization with the exception of a 1 month break last fall.  His pain got a little bit worse when he was off doxycycline and is a little better and stable now.  He says that he will take ibuprofen maybe once or twice every 2 weeks for pain but otherwise does not usually need anything.  He has had no problems tolerating doxycycline.  Review of Systems: Review of Systems  Constitutional: Negative for chills, diaphoresis, fever and weight loss.  Gastrointestinal: Negative for abdominal pain, diarrhea, nausea and vomiting.  Musculoskeletal: Positive for back pain.    Past Medical History:  Diagnosis Date  . Alcohol abuse   . Anemia   . Arthritis   . Ascites   . Cirrhosis ([place])   . Coffee ground emesis   . Dehydration 06/17/2017  . Febrile illness   . Heart murmur   . Hyperlipidemia   . Hypertension   . Leg swelling   . Myocardial infarction ([place]) 2012  . Preop cardiovascular exam 04/14/2013  . Sepsis ([place]) 06/17/2017  . Septic shock ([place]) 06/18/2017  . SIRS (systemic inflammatory response syndrome) ([place]) 07/11/2017  . Stroke [facility]) 2012   no deficits  . Thrombocytopenia ([name])     Social History   Tobacco Use  . Smoking status: Former Smoker    Packs/day: 0.50    Years: 10.00    Pack years: 5.00    Types: Cigarettes    Quit date: 01/05/2016    Years since quitting: 3.6  . Smokeless tobacco: Never Used  . Tobacco comment: 4 cigarettes a day  Substance Use Topics  . Alcohol use: Yes    Comment: o'dooles   . Drug use: Not Currently    Types: Marijuana, Heroin    Family History  Problem Relation Age of Onset  . Hypertension Father   . Diabetes Father   . Dementia Mother   . Lupus Sister     No Known Allergies  Objective: Vitals:   08/17/19 1336  BP: 114/71  Pulse: 92  Weight: 134 lb (60.8 kg)     Body mass index is 22.3 kg/m.  Physical Exam Constitutional:      Comments: He is calm and in good spirits.  Cardiovascular:     Rate and Rhythm: Normal rate and regular rhythm.     Heart sounds: No murmur.  Pulmonary:     Effort: Pulmonary effort is normal.     Breath sounds: Normal breath sounds.  Musculoskeletal:     Comments: No bony deformity of the spine.  Lab Results  Sed rate 07/08/2019 greater than 130 Sed Rate  Date Value  07/08/2019 CANCELED  06/30/2019 CANCELED  11/15/2018 >130 mm/h (H)   CRP  Date Value  06/30/2019 24.0 mg/L (H)  11/15/2018 3.5 mg/L  09/20/2018 18.9 mg/dL (H)     Problem List Items Addressed This Visit      High   Epidural abscess    We discussed the pros and cons of continuing long-term suppressive doxycycline with Mark [name] again today.  He is in favor of continuing it for now.  He will follow-up in 6 months.          Michel Bickers, MD [facility] for Infectious [place] [facility] [phone] pager   [phone] cell 08/17/2019, 1:50 PM

## 2019-08-17 NOTE — Assessment & Plan Note (Signed)
We discussed the pros and cons of continuing long-term suppressive doxycycline with Mark [name] again today.  He is in favor of continuing it for now.  He will follow-up in 6 months.

## 2020-01-19 ENCOUNTER — Other Ambulatory Visit: Payer: Self-pay | Admitting: Family Medicine

## 2020-01-20 ENCOUNTER — Other Ambulatory Visit: Payer: Self-pay | Admitting: Family Medicine

## 2020-02-02 ENCOUNTER — Other Ambulatory Visit: Payer: Self-pay

## 2020-02-02 ENCOUNTER — Other Ambulatory Visit: Payer: Self-pay | Admitting: *Deleted

## 2020-02-02 ENCOUNTER — Other Ambulatory Visit: Payer: Medicare Other

## 2020-02-02 DIAGNOSIS — G062 Extradural and subdural abscess, unspecified: Secondary | ICD-10-CM

## 2020-02-03 LAB — C-REACTIVE PROTEIN: CRP: 18.3 mg/L — ABNORMAL HIGH (ref <8.0)

## 2020-02-03 LAB — SEDIMENTATION RATE: Sed Rate: 129 mm/h — ABNORMAL HIGH (ref 0–20)

## 2020-02-15 ENCOUNTER — Other Ambulatory Visit: Payer: Self-pay | Admitting: Family Medicine

## 2020-02-16 ENCOUNTER — Ambulatory Visit: Payer: Medicare HMO | Admitting: Internal Medicine

## 2020-02-17 ENCOUNTER — Other Ambulatory Visit: Payer: Self-pay | Admitting: Family Medicine

## 2020-02-20 ENCOUNTER — Other Ambulatory Visit: Payer: Self-pay | Admitting: Family Medicine

## 2020-02-23 ENCOUNTER — Ambulatory Visit: Payer: Medicare HMO | Admitting: Family Medicine

## 2020-02-29 ENCOUNTER — Ambulatory Visit (INDEPENDENT_AMBULATORY_CARE_PROVIDER_SITE_OTHER): Payer: Medicare Other | Admitting: Internal Medicine

## 2020-02-29 ENCOUNTER — Encounter: Payer: Self-pay | Admitting: Internal Medicine

## 2020-02-29 ENCOUNTER — Other Ambulatory Visit: Payer: Self-pay

## 2020-02-29 DIAGNOSIS — Z96651 Presence of right artificial knee joint: Secondary | ICD-10-CM | POA: Diagnosis not present

## 2020-02-29 DIAGNOSIS — G062 Extradural and subdural abscess, unspecified: Secondary | ICD-10-CM

## 2020-02-29 MED ORDER — DOXYCYCLINE HYCLATE 100 MG PO TABS: 100.0000 mg | ORAL_TABLET | Freq: Two times a day (BID) | ORAL | 11 refills | Status: DC

## 2020-02-29 NOTE — Assessment & Plan Note (Addendum)
We will need to get records from Dr. Gladstone Lighter to see if his prosthetic knee is indeed infected.  I asked him to call us right away if he is hospitalized for knee surgery.  Addendum: I was able to obtain records from Dr. Gladstone Lighter.  He saw Mark [name] on 02/08/2019 wound and noted some right prepatellar swelling.  An aspirate yielded a few drops of bloody fluid.  No organisms were seen on Gram stain or culture.  He was given a steroid injection and started back on doxycycline.  X-rays did reveal loosening of the prosthesis, particularly the tibial component.  On exam today he seems to be doing better.  He will continue doxycycline.

## 2020-02-29 NOTE — Assessment & Plan Note (Signed)
It seems as though his back pain is better.  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  He will stay on doxycycline for now.

## 2020-02-29 NOTE — Progress Notes (Addendum)
[facility]  Patient Active Problem List   Diagnosis Date Noted  . MRSA bacteremia 09/21/2018    Priority: High  . Epidural abscess 09/20/2018    Priority: High  . S/P right TKA 05/02/2013    Priority: High  . Cirrhosis of liver without ascites (Mark [name])   . AKI (acute kidney injury) (Mark [name])   . Bacteremia   . Spinal stenosis of lumbar region without neurogenic claudication   . Aspiration pneumonia (Mark [name]) 09/24/2018  . Abscess in epidural space of lumbar spine   . Septic arthritis (Mark [name]) 09/20/2018  . Anemia of chronic disease 09/20/2018  . Hypoalbuminemia 09/20/2018  . Hypoglycemia without diagnosis of diabetes mellitus 09/20/2018  . Hyperkalemia 05/18/2018  . Edentulous 05/11/2018  . Pancytopenia (Mark [name]) 05/10/2018  . CAD (coronary artery disease) 05/10/2018  . Hepatic encephalopathy (Mark [name]) 05/10/2018  . Alcohol abuse 05/10/2018  . GERD (gastroesophageal reflux disease) 05/10/2018  . Duodenal ulcer   . Renal failure   . Acute on chronic anemia   . Hypotension 06/17/2017  . Acute on chronic renal failure (Mark [name]) 06/17/2017  . Hyponatremia 06/17/2017  . Thrombocytopenia (Mark [name]) 06/17/2017  . Leg edema, right 06/17/2017  . Acute metabolic encephalopathy 02/19/8526  . Anemia, iron deficiency   . Benign neoplasm of ascending colon   . Hemorrhoids   . Portal hypertensive gastropathy (Mark [name])   . Gastritis and gastroduodenitis   . Esophageal varices without bleeding (Mark [name])   . H/O: CVA (cerebrovascular accident) 12/01/2013  . ACS (acute coronary syndrome) (Mark [name]) 12/01/2013  . Anasarca 12/01/2013  . Polysubstance abuse (Mark [name]) 12/01/2013  . CKD (chronic kidney disease) stage 3, GFR 30-59 ml/min (Mark [name]) 12/01/2013  . Murmur 04/14/2013  . Right inguinal hernia 12/26/2010  . Cirrhosis, alcoholic (Mark [name]) 78/24/2353    Patient's Medications  Mark Prescriptions   No medications on file  Previous Medications   ALBUTEROL (PROVENTIL HFA;VENTOLIN HFA) 108 (90  BASE) MCG/ACT INHALER    Inhale 2 puffs into the lungs every 6 (six) hours as needed for wheezing or shortness of breath.   FOLIC ACID (FOLVITE) 1 MG TABLET    TAKE 1 TABLET(1 MG) BY MOUTH DAILY   FUROSEMIDE (LASIX) 20 MG TABLET    TAKE 1 TABLET(20 MG) BY MOUTH TWICE DAILY   IBUPROFEN (ADVIL) 400 MG TABLET    Take 400 mg by mouth every 8 (eight) hours.   KETOCONAZOLE (NIZORAL) 2 % SHAMPOO    Apply 1 application topically 2 (two) times a week.   LACTULOSE (CHRONULAC) 10 GM/15ML SOLUTION    TAKE 45 MILLILITERS BY MOUTH TWICE DAILY   MULTIPLE VITAMIN (MULTIVITAMIN WITH MINERALS) TABS TABLET    Take 1 tablet by mouth daily.   NADOLOL (CORGARD) 20 MG TABLET    TAKE 1 TABLET BY MOUTH EVERY DAY   NAPROXEN SODIUM (ALEVE) 220 MG TABLET    Take 440 mg by mouth 2 (two) times daily as needed.   ONDANSETRON (ZOFRAN) 4 MG TABLET    Take 1 tablet (4 mg total) by mouth every 6 (six) hours as needed for nausea.   OXYCODONE (OXY IR/ROXICODONE) 5 MG IMMEDIATE RELEASE TABLET    Take 1 tablet (5 mg total) by mouth every 4 (four) hours as needed for moderate pain.   PANTOPRAZOLE (PROTONIX) 40 MG TABLET    Take 1 tablet (40 mg total) by mouth daily.   SPIRONOLACTONE (ALDACTONE) 50 MG TABLET    Take 1 tablet (50 mg total) by mouth 2 (  two) times daily.   THIAMINE (VITAMIN B-1) 100 MG TABLET    Take 1 tablet (100 mg total) by mouth daily.   THIAMINE 100 MG TABLET    Take 1 tablet (100 mg total) by mouth daily.   TRAMADOL (ULTRAM) 50 MG TABLET    Take 50 mg by mouth every 6 (six) hours.   TRIAMCINOLONE (KENALOG) 0.025 % OINTMENT    Apply 1 application topically 2 (two) times daily as needed (rash).  Modified Medications   Modified Medication Previous Medication   DOXYCYCLINE (VIBRA-TABS) 100 MG TABLET doxycycline (VIBRA-TABS) 100 MG tablet      Take 1 tablet (100 mg total) by mouth every 12 (twelve) hours.    Take 1 tablet (100 mg total) by mouth every 12 (twelve) hours.  Discontinued Medications   DIPHENHYDRAMINE-ZINC  ACETATE (BENADRYL EXTRA STRENGTH) CREAM    Apply 1 application topically 3 (three) times daily as needed for itching.    Subjective: Mark [name] is in for his routine follow-up visit.  He has had recurrent bacteremia.  He had group A strep bacteremia in February 2019, MSSA bacteremia in January 2020 and MRSA bacteremia in May 2020.  His last bacteremia was complicated by thoracic spine infection.  He has been taking chronic doxycycline ever since that hospitalization.  However, he stopped taking it sometime in the past 2 months.  He says he stopped and his wife interrupts him and says he ran out.  He is not exactly sure when he stopped it.  He has not had any return of back pain but he began having increased pain, swelling and "popping" in his prosthetic right knee.  He thinks that this started after he quit doxycycline.  He cannot recall who replaced his knee about 8 years ago.  He recently saw Mark [name] who told him that the prosthesis was infected and loose.  He said that fluid was drained off of the knee.  A call to Mark [name] office indicated that the culture specimen was lost.  I believe he was started back on doxycycline.  Mark [name] and his wife were not sure what he is taking.  He is scheduled for follow-up there tomorrow.  Review of Systems: Review of Systems  Constitutional: Negative for chills, diaphoresis, fever and weight loss.  Gastrointestinal: Negative for abdominal pain, diarrhea, nausea and vomiting.  Musculoskeletal: Positive for joint pain. Negative for back pain.    Past Medical History:  Diagnosis Date  . Alcohol abuse   . Anemia   . Arthritis   . Ascites   . Cirrhosis ([place])   . Coffee ground emesis   . Dehydration 06/17/2017  . Febrile illness   . Heart murmur   . Hyperlipidemia   . Hypertension   . Leg swelling   . Myocardial infarction ([place]) 2012  . Preop cardiovascular exam 04/14/2013  . Sepsis ([place]) 06/17/2017  . Septic shock ([place]) 06/18/2017  . SIRS (systemic  inflammatory response syndrome) ([place]) 07/11/2017  . Stroke [facility]) 2012   no deficits  . Thrombocytopenia (Mark [name])     Social History   Tobacco Use  . Smoking status: Former Smoker    Packs/day: 0.50    Years: 10.00    Pack years: 5.00    Types: Cigarettes    Quit date: 01/05/2016    Years since quitting: 4.1  . Smokeless tobacco: Never Used  . Tobacco comment: 4 cigarettes a day  Vaping Use  . Vaping Use: Never used  Substance Use Topics  .  Alcohol use: Yes    Comment: o'dooles   . Drug use: Not Currently    Types: Marijuana, Heroin    Family History  Problem Relation Age of Onset  . Hypertension Father   . Diabetes Father   . Dementia Mother   . Lupus Sister     No Known Allergies  Objective: Vitals:   02/29/20 1147  BP: (!) 168/77  Pulse: (!) 101  Temp: 98.1 F (36.7 C)  TempSrc: Oral  SpO2: 100%  Weight: 134 lb (60.8 kg)  Height: 5\' 7"  (1.702 m)   Body mass index is 20.99 kg/m.  Physical Exam Constitutional:      Comments: He and his wife are very confused about recent events.  She is very groggy.  He is very focused about getting pain medication.  Cardiovascular:     Rate and Rhythm: Normal rate and regular rhythm.     Heart sounds: Murmur heard.      Comments: 2/6 systolic murmur at the right upper sternal border. Pulmonary:     Effort: Pulmonary effort is normal.     Breath sounds: Normal breath sounds.  Musculoskeletal:     Comments: No bony deformity of the spine.  He has healed incisions on his right knee.  There is slight diffuse swelling but no unusual warmth or redness.     Lab Results  Sed Rate  Date Value  02/02/2020 129 mm/h (H)  07/08/2019 CANCELED  06/30/2019 CANCELED   CRP (mg/L)  Date Value  02/02/2020 18.3 (H)  06/30/2019 24.0 (H)  11/15/2018 3.5     Problem List Items Addressed This Visit      High   S/P right TKA    We will need to get records from Dr. Gladstone Lighter to see if his prosthetic knee is indeed infected.  I  asked him to call us right away if he is hospitalized for knee surgery.  Addendum: I was able to obtain records from Dr. Gladstone Lighter.  He saw Mark [name] on 02/08/2019 wound and noted some right prepatellar swelling.  An aspirate yielded a few drops of bloody fluid.  No organisms were seen on Gram stain or culture.  He was given a steroid injection and started back on doxycycline.  X-rays did reveal loosening of the prosthesis, particularly the tibial component.  On exam today he seems to be doing better.  He will continue doxycycline.      Epidural abscess    It seems as though his back pain is better.  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  He will stay on doxycycline for now.      Relevant Medications   doxycycline (VIBRA-TABS) 100 MG tablet       Michel Bickers, MD [facility] for [facility] [phone] pager   [phone] cell 03/01/2020, 8:44 AM

## 2020-03-02 ENCOUNTER — Telehealth: Payer: Self-pay | Admitting: Family Medicine

## 2020-03-02 NOTE — Telephone Encounter (Signed)
Left message for patient to schedule Annual Wellness Visit.  Please schedule with Nurse Health Advisor Shannon Crews, RN at [facility]  

## 2020-03-07 ENCOUNTER — Ambulatory Visit: Payer: Self-pay

## 2020-03-12 NOTE — Progress Notes (Signed)
Pt. Needs orders for upcomming surgery.PST and lab. appointment on 03/13/20.Thanks.

## 2020-03-12 NOTE — Patient Instructions (Addendum)
DUE TO COVID-19 ONLY ONE VISITOR IS ALLOWED TO COME WITH YOU AND STAY IN THE WAITING ROOM ONLY DURING PRE OP AND PROCEDURE DAY OF SURGERY. THE 1 VISITOR  MAY VISIT WITH YOU AFTER SURGERY IN YOUR PRIVATE ROOM DURING VISITING HOURS ONLY!  YOU NEED TO HAVE A COVID 19 TEST ON: 03/16/20 @ 2:00 PM , THIS TEST MUST BE DONE BEFORE SURGERY,  COVID TESTING SITE [place] [id number], IT IS ON THE RIGHT GOING OUT [address] APPROXIMATELY  2 MINUTES PAST [employer] ON THE RIGHT. ONCE YOUR COVID TEST IS COMPLETED,  PLEASE BEGIN THE QUARANTINE INSTRUCTIONS AS OUTLINED IN YOUR HANDOUT.                [name]    Your procedure is scheduled on: 03/20/20   Report to [facility] Main  Entrance   Report to admitting at: 8:00 AM     Call this number if you have problems the morning of surgery [phone]    Remember: Do not eat food or drink liquids :After Midnight.   BRUSH YOUR TEETH MORNING OF SURGERY AND RINSE YOUR MOUTH OUT, NO CHEWING GUM CANDY OR MINTS.    Take these medicines the morning of surgery with A SIP OF WATER: Protonix,nadolol.Use inhalers as usual.                               You may not have any metal on your body including hair pins and              piercings  Do not wear jewelry, lotions, powders or perfumes, deodorant             Men may shave face and neck.    Do not bring valuables to the hospital. [place].  Contacts, dentures or bridgework may not be worn into surgery.  Leave suitcase in the car. After surgery it may be brought to your room.     Patients discharged the day of surgery will not be allowed to drive home. IF YOU ARE HAVING SURGERY AND GOING HOME THE SAME DAY, YOU MUST HAVE AN ADULT TO DRIVE YOU HOME AND BE WITH YOU FOR 24 HOURS. YOU MAY GO HOME BY TAXI OR UBER OR ORTHERWISE, BUT AN ADULT MUST ACCOMPANY YOU HOME AND STAY WITH YOU FOR 24 HOURS.  Name and  phone number of your driver:  Special Instructions: N/A              Please read over the following fact sheets you were given: _____________________________________________________________________         [facility] - Preparing for Surgery Before surgery, you can play an important role.  Because skin is not sterile, your skin needs to be as free of germs as possible.  You can reduce the number of germs on your skin by washing with CHG (chlorahexidine gluconate) soap before surgery.  CHG is an antiseptic cleaner which kills germs and bonds with the skin to continue killing germs even after washing. Please DO NOT use if you have an allergy to CHG or antibacterial soaps.  If your skin becomes reddened/irritated stop using the CHG and inform your nurse when you arrive at Short Stay. Do not shave (including legs and underarms) for at least 48 hours  prior to the first CHG shower.  You may shave your face/neck. Please follow these instructions carefully:  1.  Shower with CHG Soap the night before surgery and the  morning of Surgery.  2.  If you choose to wash your hair, wash your hair first as usual with your  normal  shampoo.  3.  After you shampoo, rinse your hair and body thoroughly to remove the  shampoo.                           4.  Use CHG as you would any other liquid soap.  You can apply chg directly  to the skin and wash                       Gently with a scrungie or clean washcloth.  5.  Apply the CHG Soap to your body ONLY FROM THE NECK DOWN.   Do not use on face/ open                           Wound or open sores. Avoid contact with eyes, ears mouth and genitals (private parts).                       Wash face,  Genitals (private parts) with your normal soap.             6.  Wash thoroughly, paying special attention to the area where your surgery  will be performed.  7.  Thoroughly rinse your body with warm water from the neck down.  8.  DO NOT shower/wash with your normal soap after  using and rinsing off  the CHG Soap.                9.  Pat yourself dry with a clean towel.            10.  Wear clean pajamas.            11.  Place clean sheets on your bed the night of your first shower and do not  sleep with pets. Day of Surgery : Do not apply any lotions/deodorants the morning of surgery.  Please wear clean clothes to the hospital/surgery center.  FAILURE TO FOLLOW THESE INSTRUCTIONS MAY RESULT IN THE CANCELLATION OF YOUR SURGERY PATIENT SIGNATURE_________________________________  NURSE SIGNATURE__________________________________  ________________________________________________________________________

## 2020-03-13 ENCOUNTER — Encounter (HOSPITAL_COMMUNITY)
Admission: RE | Admit: 2020-03-13 | Discharge: 2020-03-13 | Disposition: A | Discharge status: Home / Self Care | Payer: Medicare Other | Source: Ambulatory Visit | Attending: Orthopedic Surgery | Admitting: Orthopedic Surgery

## 2020-03-13 ENCOUNTER — Other Ambulatory Visit: Payer: Self-pay

## 2020-03-13 ENCOUNTER — Encounter (HOSPITAL_COMMUNITY): Payer: Self-pay

## 2020-03-13 DIAGNOSIS — Z01818 Encounter for other preprocedural examination: Secondary | ICD-10-CM | POA: Insufficient documentation

## 2020-03-13 LAB — CBC
HCT: 24.1 % — ABNORMAL LOW (ref 39.0–52.0)
Hemoglobin: 7.9 g/dL — ABNORMAL LOW (ref 13.0–17.0)
MCH: 31.7 pg (ref 26.0–34.0)
MCHC: 32.8 g/dL (ref 30.0–36.0)
MCV: 96.8 fL (ref 80.0–100.0)
Platelets: 199 10*3/uL (ref 150–400)
RBC: 2.49 MIL/uL — ABNORMAL LOW (ref 4.22–5.81)
RDW: 15.9 % — ABNORMAL HIGH (ref 11.5–15.5)
WBC: 5.4 10*3/uL (ref 4.0–10.5)
nRBC: 0 % (ref 0.0–0.2)

## 2020-03-13 LAB — BASIC METABOLIC PANEL
Anion gap: 10 (ref 5–15)
BUN: 53 mg/dL — ABNORMAL HIGH (ref 8–23)
CO2: 21 mmol/L — ABNORMAL LOW (ref 22–32)
Calcium: 8.5 mg/dL — ABNORMAL LOW (ref 8.9–10.3)
Chloride: 108 mmol/L (ref 98–111)
Creatinine, Ser: 1.74 mg/dL — ABNORMAL HIGH (ref 0.61–1.24)
GFR, Estimated: 43 mL/min — ABNORMAL LOW (ref >60)
Glucose, Bld: 81 mg/dL (ref 70–99)
Potassium: 4.6 mmol/L (ref 3.5–5.1)
Sodium: 139 mmol/L (ref 135–145)

## 2020-03-13 LAB — SURGICAL PCR SCREEN
MRSA, PCR: NEGATIVE
Staphylococcus aureus: NEGATIVE

## 2020-03-13 NOTE — Progress Notes (Signed)
Lab. Results: HCT: 24.1; Hemoglobin: 7.9.; Cr: 1.74.

## 2020-03-13 NOTE — Progress Notes (Signed)
COVID Vaccine Completed: NO Date COVID Vaccine completed: COVID vaccine manufacturer: [employer]   PCP - Dr. Grier Mitts. Cardiologist - Dr. Jenkins Rouge.  Chest x-ray -  EKG -  Stress Test -  ECHO - 09/22/18. EPIC Cardiac Cath -  Pacemaker/ICD device last checked:  Sleep Study -  CPAP -   Fasting Blood Sugar -  Checks Blood Sugar _____ times a day  Blood Thinner Instructions: Aspirin Instructions: Last Dose:  Anesthesia review: Hx. HTN,septic shock,MI,stroke. Pt. Able to go up one flight of stairs with some difficulty because his knee pain.  Patient denies shortness of breath, fever, cough and chest pain at PAT appointment   Patient verbalized understanding of instructions that were given to them at the PAT appointment. Patient was also instructed that they will need to review over the PAT instructions again at home before surgery.

## 2020-03-13 NOTE — Progress Notes (Signed)
Pt. Did not show up for his PST appointment at 11:00 am today,RN was able to do the interview over the phone,pt. Agreed to come at 1:30 for lab. Work.

## 2020-03-14 ENCOUNTER — Ambulatory Visit (INDEPENDENT_AMBULATORY_CARE_PROVIDER_SITE_OTHER): Payer: Medicare Other

## 2020-03-14 DIAGNOSIS — Z0000 Encounter for general adult medical examination without abnormal findings: Secondary | ICD-10-CM

## 2020-03-14 NOTE — Progress Notes (Signed)
Subjective:   Mark [name] is a 64 y.o. male who presents for an Initial Medicare Annual Wellness Visit.  I connected with Mark [name] today by telephone and verified that I am speaking with the correct person using two identifiers. Location patient: home Location provider: work Persons participating in the virtual visit: patient, provider.   I discussed the limitations, risks, security and privacy concerns of performing an evaluation and management service by telephone and the availability of in person appointments. I also discussed with the patient that there may be a patient responsible charge related to this service. The patient expressed understanding and verbally consented to this telephonic visit.    Interactive audio and video telecommunications were attempted between this provider and patient, however failed, due to patient having technical difficulties OR patient did not have access to video capability.  We continued and completed visit with audio only.      Review of Systems    N/A  Cardiac Risk Factors include: advanced age (>73men, >75 women);male gender;hypertension     Objective:    Today's Vitals   There is no height or weight on file to calculate BMI.  Advanced Directives 03/14/2020 03/13/2020 12/23/2018 12/22/2018 09/20/2018 09/19/2018 08/22/2018  Does Patient Have a Medical Advance Directive? No;Yes No Yes Unable to assess, patient is non-responsive or altered mental status No No No  Type of Paramedic of [place];Living will - - - - - -  Does patient want to make changes to medical advance directive? No - Patient declined - - - - - -  Copy of [place] in Chart? No - copy requested - - - - - -  Would patient like information on creating a medical advance directive? - - - - No - Patient declined No - Patient declined No - Patient declined  Pre-existing out of facility DNR order (yellow form or pink MOST form) - - -  - - - -    Current Medications (verified) Outpatient Encounter Medications as of 03/14/2020  Medication Sig  . albuterol (PROVENTIL HFA;VENTOLIN HFA) 108 (90 Base) MCG/ACT inhaler Inhale 2 puffs into the lungs every 6 (six) hours as needed for wheezing or shortness of breath.  . doxycycline (VIBRA-TABS) 100 MG tablet Take 1 tablet (100 mg total) by mouth every 12 (twelve) hours.  . folic acid (FOLVITE) 1 MG tablet TAKE 1 TABLET(1 MG) BY MOUTH DAILY  . furosemide (LASIX) 20 MG tablet TAKE 1 TABLET(20 MG) BY MOUTH TWICE DAILY  . ibuprofen (ADVIL) 400 MG tablet Take 400 mg by mouth every 8 (eight) hours.   Marland Kitchen ketoconazole (NIZORAL) 2 % shampoo Apply 1 application topically 2 (two) times a week.  . lactulose (CHRONULAC) 10 GM/15ML solution TAKE 45 MILLILITERS BY MOUTH TWICE DAILY  . Multiple Vitamin (MULTIVITAMIN WITH MINERALS) TABS tablet Take 1 tablet by mouth daily.  . nadolol (CORGARD) 20 MG tablet TAKE 1 TABLET BY MOUTH EVERY DAY  . naproxen sodium (ALEVE) 220 MG tablet Take 440 mg by mouth 2 (two) times daily as needed.  . ondansetron (ZOFRAN) 4 MG tablet Take 1 tablet (4 mg total) by mouth every 6 (six) hours as needed for nausea.  Marland Kitchen oxyCODONE (OXY IR/ROXICODONE) 5 MG immediate release tablet Take 1 tablet (5 mg total) by mouth every 4 (four) hours as needed for moderate pain.  . pantoprazole (PROTONIX) 40 MG tablet Take 1 tablet (40 mg total) by mouth daily.  Marland Kitchen spironolactone (ALDACTONE) 50 MG tablet Take  1 tablet (50 mg total) by mouth 2 (two) times daily.  Marland Kitchen thiamine (VITAMIN B-1) 100 MG tablet Take 1 tablet (100 mg total) by mouth daily.  Marland Kitchen thiamine 100 MG tablet Take 1 tablet (100 mg total) by mouth daily.  . traMADol (ULTRAM) 50 MG tablet Take 50 mg by mouth every 6 (six) hours.  . triamcinolone (KENALOG) 0.025 % ointment Apply 1 application topically 2 (two) times daily as needed (rash).   No facility-administered encounter medications on file as of 03/14/2020.    Allergies  (verified) Patient has no known allergies.   History: Past Medical History:  Diagnosis Date  . Alcohol abuse   . Anemia   . Arthritis   . Ascites   . Cirrhosis ([place])   . Coffee ground emesis   . Dehydration 06/17/2017  . Febrile illness   . Heart murmur   . Hyperlipidemia   . Hypertension   . Leg swelling   . Myocardial infarction ([place]) 2012  . Preop cardiovascular exam 04/14/2013  . Sepsis ([place]) 06/17/2017  . Septic shock ([place]) 06/18/2017  . SIRS (systemic inflammatory response syndrome) ([place]) 07/11/2017  . Stroke [facility]) 2012   no deficits  . Thrombocytopenia ([place])    Past Surgical History:  Procedure Laterality Date  . COLONOSCOPY WITH PROPOFOL N/A 02/07/2016   Procedure: COLONOSCOPY WITH PROPOFOL;  Surgeon: Milus Banister, MD;  Location: [facility] ENDOSCOPY;  Service: Endoscopy;  Laterality: N/A;  . ESOPHAGOGASTRODUODENOSCOPY (EGD) WITH PROPOFOL N/A 02/07/2016   Procedure: ESOPHAGOGASTRODUODENOSCOPY (EGD) WITH PROPOFOL;  Surgeon: Milus Banister, MD;  Location: [facility] ENDOSCOPY;  Service: Endoscopy;  Laterality: N/A;  . ESOPHAGOGASTRODUODENOSCOPY (EGD) WITH PROPOFOL N/A 06/22/2017   Procedure: ESOPHAGOGASTRODUODENOSCOPY (EGD) WITH PROPOFOL;  Surgeon: Jerene Bears, MD;  Location: [facility] ENDOSCOPY;  Service: Gastroenterology;  Laterality: N/A;  . HERNIA REPAIR Right    inguinal  . KNEE ARTHROSCOPY     bilateral/  12/14  . TEE WITHOUT CARDIOVERSION N/A 05/13/2018   Procedure: TRANSESOPHAGEAL ECHOCARDIOGRAM (TEE);  Surgeon: [name], MD;  Location[facility] END[facility]COPY;  Service: Cardiovascular;  Laterality: N/A;  . TOTAL KNEE ARTHROPLASTY Right 05/02/2013   Procedure: RIGHT TOTAL KNEE ARTHROPLASTY;  Surgeon: Mauri Pole, MD;  Location: [facility] ORS;  Service: Orthopedics;  Laterality: Right;   Family History  Problem Relation Age of Onset  . Hypertension Father   . Diabetes Father   . Dementia Mother   . Lupus Sister    Social History   Socioeconomic History  . Marital status: Married      Spouse name: Not on file  . Number of children: 4  . Years of education: Not on file  . Highest education level: Not on file  Occupational History  . Occupation: Maintenance    Employer: [employer]  Tobacco Use  . Smoking status: Former Smoker    Packs/day: 0.50    Years: 10.00    Pack years: 5.00    Types: Cigarettes    Quit date: 01/05/2016    Years since quitting: 4.1  . Smokeless tobacco: Never Used  . Tobacco comment: 4 cigarettes a day  Vaping Use  . Vaping Use: Never used  Substance and Sexual Activity  . Alcohol use: Yes    Comment: very little  . Drug use: Not Currently    Types: Marijuana, Heroin  . Sexual activity: Not Currently    Comment: now and then smokies marijuana  Other Topics Concern  . Not on file  Social History  Narrative  . Not on file   Social Determinants of Health   Financial Resource Strain: Medium Risk  . Difficulty of Paying Living Expenses: Somewhat hard  Food Insecurity: No Food Insecurity  . Worried About Charity fundraiser in the Last Year: Never true  . Ran Out of Food in the Last Year: Never true  Transportation Needs: No Transportation Needs  . Lack of Transportation (Medical): No  . Lack of Transportation (Non-Medical): No  Physical Activity: Inactive  . Days of Exercise per Week: 0 days  . Minutes of Exercise per Session: 0 min  Stress: No Stress Concern Present  . Feeling of Stress : Not at all  Social Connections: Moderately Integrated  . Frequency of Communication with Friends and Family: More than three times a week  . Frequency of Social Gatherings with Friends and Family: More than three times a week  . Attends Religious Services: More than 4 times per year  . Active Member of Clubs or Organizations: No  . Attends Archivist Meetings: Never  . Marital Status: Married    Tobacco Counseling Counseling given: Not Answered Comment: 4 cigarettes a day   Clinical Intake:  Pre-visit preparation  completed: Yes  Pain : No/denies pain     Nutritional Risks: Unintentional weight loss (has c/o no appetite) Diabetes: No  How often do you need to have someone help you when you read instructions, pamphlets, or other written materials from your doctor or pharmacy?: 1 - Never What is the last grade level you completed in school?: 12th Grade  Diabetic?No   Interpreter Needed?: No  Information entered by :: [place] of Daily Living In your present state of health, do you have any difficulty performing the following activities: 03/14/2020 03/13/2020  Hearing? N N  Vision? Y N  Comment right eye blurred vision -  Difficulty concentrating or making decisions? N N  Walking or climbing stairs? N Y  Dressing or bathing? N N  Doing errands, shopping? Y N  Preparing Food and eating ? N -  Using the Toilet? N -  In the past six months, have you accidently leaked urine? N -  Do you have problems with loss of bowel control? N -  Managing your Medications? N -  Managing your Finances? N -  Housekeeping or managing your Housekeeping? N -  Some recent data might be hidden    Patient Care Team: [name], MD as PCP - General (Family Medicine) Josue Hector, MD as PCP - Cardiology (Cardiology)  Indicate any recent Medical Services you may have received from other than [facility] providers in the past year (date may be approximate).     Assessment:   This is a routine wellness examination for [place].  Hearing/Vision screen  Hearing Screening   125Hz  250Hz  500Hz  1000Hz  2000Hz  3000Hz  4000Hz  6000Hz  8000Hz   Right ear:           Left ear:           Vision Screening Comments: Patient states has not had and eye exam in long time   Dietary issues and exercise activities discussed: Current Exercise Habits: The patient does not participate in regular exercise at present, Exercise limited by: orthopedic condition(s);psychological condition(s)  Goals    . Exercise 3x per  week (30 min per time)    . Patient Stated     I would like to work on some things around my house  Depression Screen PHQ 2/9 Scores 03/14/2020 08/17/2019 03/22/2019 11/15/2018 02/02/2017 06/29/2014 06/29/2014  PHQ - 2 Score 0 1 0 0 0 1 0  PHQ- 9 Score 0 - - - - - -    Fall Risk Fall Risk  03/14/2020 08/17/2019 03/22/2019 11/15/2018 06/29/2014  Falls in the past year? 1 0 0 0 Yes  Comment - - - - -  Number falls in past yr: 1 - - 0 2 or more  Comment 4-5 falls - - - -  Injury with Fall? 0 - - 0 No  Risk for fall due to : Impaired balance/gait Impaired balance/gait;Impaired mobility Impaired balance/gait;Impaired mobility;Orthopedic patient - Impaired balance/gait  Follow up Falls evaluation completed;Falls prevention discussed Falls evaluation completed;Education provided Falls evaluation completed - -    Any stairs in or around the home? No  If so, are there any without handrails? No  Home free of loose throw rugs in walkways, pet beds, electrical cords, etc? Yes  Adequate lighting in your home to reduce risk of falls? Yes   ASSISTIVE DEVICES UTILIZED TO PREVENT FALLS:  Life alert? No  Use of a cane, walker or w/c? Yes  Grab bars in the bathroom? No  Shower chair or bench in shower? No  Elevated toilet seat or a handicapped toilet? No   Cognitive Function:  Cognition within normal limits based on direct observation.       Immunizations Immunization History  Administered Date(s) Administered  . Hep A / Hep B 06/06/2011, 06/19/2011, 06/27/2011  . Tdap 12/05/2013  . Zoster 12/24/2013    TDAP status: Up to date Flu Vaccine status: Declined, Education has been provided regarding the importance of this vaccine but patient still declined. Advised may receive this vaccine at local pharmacy or Health Dept. Aware to provide a copy of the vaccination record if obtained from local pharmacy or Health Dept. Verbalized acceptance and understanding. Pneumococcal vaccine status: Declined,   Education has been provided regarding the importance of this vaccine but patient still declined. Advised may receive this vaccine at local pharmacy or Health Dept. Aware to provide a copy of the vaccination record if obtained from local pharmacy or Health Dept. Verbalized acceptance and understanding.  Covid-19 vaccine status: Declined, Education has been provided regarding the importance of this vaccine but patient still declined. Advised may receive this vaccine at local pharmacy or Health Dept.or vaccine clinic. Aware to provide a copy of the vaccination record if obtained from local pharmacy or Health Dept. Verbalized acceptance and understanding.  Qualifies for Shingles Vaccine? Yes   Zostavax completed Yes   Shingrix Completed?: No.    Education has been provided regarding the importance of this vaccine. Patient has been advised to call insurance company to determine out of pocket expense if they have not yet received this vaccine. Advised may also receive vaccine at local pharmacy or Health Dept. Verbalized acceptance and understanding.  Screening Tests Health Maintenance  Topic Date Due  . COVID-19 Vaccine (1) Never done  . PNA vac Low Risk Adult (1 of 2 - PCV13) Never done  . INFLUENZA VACCINE  Never done  . TETANUS/TDAP  12/06/2023  . COLONOSCOPY  02/06/2026  . Hepatitis C Screening  Completed    Health Maintenance  Health Maintenance Due  Topic Date Due  . COVID-19 Vaccine (1) Never done  . PNA vac Low Risk Adult (1 of 2 - PCV13) Never done  . INFLUENZA VACCINE  Never done    Colorectal cancer screening: Completed 02/07/2016.  Repeat every 10 years  Lung Cancer Screening: (Low Dose CT Chest recommended if Age 77-80 years, 30 pack-year currently smoking OR have quit w/in 15years.) does not qualify.   Lung Cancer Screening Referral: N/A   Additional Screening:  Hepatitis C Screening: does qualify; Completed 05/13/2018  Vision Screening: Recommended annual ophthalmology  exams for early detection of glaucoma and other disorders of the eye. Is the patient up to date with their annual eye exam?  No  Who is the provider or what is the name of the office in which the patient attends annual eye exams? Patient does not currently have and eye doctor  If pt is not established with a provider, would they like to be referred to a provider to establish care? No .   Dental Screening: Recommended annual dental exams for proper oral hygiene  Community Resource Referral / Chronic Care Management: CRR required this visit?  No   CCM required this visit?  No      Plan:     I have personally reviewed and noted the following in the patient's chart:   . Medical and social history . Use of alcohol, tobacco or illicit drugs  . Current medications and supplements . Functional ability and status . Nutritional status . Physical activity . Advanced directives . List of other physicians . Hospitalizations, surgeries, and ER visits in previous 12 months . Vitals . Screenings to include cognitive, depression, and falls . Referrals and appointments  In addition, I have reviewed and discussed with patient certain preventive protocols, quality metrics, and best practice recommendations. A written personalized care plan for preventive services as well as general preventive health recommendations were provided to patient.     Ofilia Neas, LPN   [id number]   Nurse Notes: None

## 2020-03-14 NOTE — Patient Instructions (Signed)
[name] , Thank you for taking time to come for your Medicare Wellness Visit. I appreciate your ongoing commitment to your health goals. Please review the following plan we discussed and let me know if I can assist you in the future.   Screening recommendations/referrals: Colonoscopy: Up to date, next due 02/06/2026 Recommended yearly ophthalmology/optometry visit for glaucoma screening and checkup Recommended yearly dental visit for hygiene and checkup  Vaccinations: Influenza vaccine: You declined this, however if you wish to receive please let us know  Pneumococcal vaccine: You declined this if you change your mind we will be happy to give you the vaccines Tdap vaccine: Up to date, next due 12/06/2023 Shingles vaccine: Currently due for Shingrix, if you wish to receive we recommend that you get it at your local pharmacy as it will be less expensive     Advanced directives: Copies of advanced directives on file   Conditions/risks identified: Please try to incorporate at least 30 minutes of exercise into your daily routine 3x per week  Next appointment: None   Preventive Care 65 Years and Older, Male Preventive care refers to lifestyle choices and visits with your health care provider that can promote health and wellness. What does preventive care include?  A yearly physical exam. This is also called an annual well check.  Dental exams once or twice a year.  Routine eye exams. Ask your health care provider how often you should have your eyes checked.  Personal lifestyle choices, including:  Daily care of your teeth and gums.  Regular physical activity.  Eating a healthy diet.  Avoiding tobacco and drug use.  Limiting alcohol use.  Practicing safe sex.  Taking low doses of aspirin every day.  Taking vitamin and mineral supplements as recommended by your health care provider. What happens during an annual well check? The services and screenings done by your health  care provider during your annual well check will depend on your age, overall health, lifestyle risk factors, and family history of disease. Counseling  Your health care provider may ask you questions about your:  Alcohol use.  Tobacco use.  Drug use.  Emotional well-being.  Home and relationship well-being.  Sexual activity.  Eating habits.  History of falls.  Memory and ability to understand (cognition).  Work and work Statistician. Screening  You may have the following tests or measurements:  Height, weight, and BMI.  Blood pressure.  Lipid and cholesterol levels. These may be checked every 5 years, or more frequently if you are over 42 years old.  Skin check.  Lung cancer screening. You may have this screening every year starting at age 76 if you have a 30-pack-year history of smoking and currently smoke or have quit within the past 15 years.  Fecal occult blood test (FOBT) of the stool. You may have this test every year starting at age 30.  Flexible sigmoidoscopy or colonoscopy. You may have a sigmoidoscopy every 5 years or a colonoscopy every 10 years starting at age 90.  Prostate cancer screening. Recommendations will vary depending on your family history and other risks.  Hepatitis C blood test.  Hepatitis B blood test.  Sexually transmitted disease (STD) testing.  Diabetes screening. This is done by checking your blood sugar (glucose) after you have not eaten for a while (fasting). You may have this done every 1-3 years.  Abdominal aortic aneurysm (AAA) screening. You may need this if you are a current or former smoker.  Osteoporosis. You may be  screened starting at age 72 if you are at high risk. Talk with your health care provider about your test results, treatment options, and if necessary, the need for more tests. Vaccines  Your health care provider may recommend certain vaccines, such as:  Influenza vaccine. This is recommended every  year.  Tetanus, diphtheria, and acellular pertussis (Tdap, Td) vaccine. You may need a Td booster every 10 years.  Zoster vaccine. You may need this after age 32.  Pneumococcal 13-valent conjugate (PCV13) vaccine. One dose is recommended after age 51.  Pneumococcal polysaccharide (PPSV23) vaccine. One dose is recommended after age 55. Talk to your health care provider about which screenings and vaccines you need and how often you need them. This information is not intended to replace advice given to you by your health care provider. Make sure you discuss any questions you have with your health care provider. Document Released: 05/11/2015 Document Revised: 01/02/2016 Document Reviewed: 02/13/2015 Elsevier Interactive Patient Education  2017 [place] Prevention in the Home Falls can cause injuries. They can happen to people of all ages. There are many things you can do to make your home safe and to help prevent falls. What can I do on the outside of my home?  Regularly fix the edges of walkways and driveways and fix any cracks.  Remove anything that might make you trip as you walk through a door, such as a raised step or threshold.  Trim any bushes or trees on the path to your home.  Use bright outdoor lighting.  Clear any walking paths of anything that might make someone trip, such as rocks or tools.  Regularly check to see if handrails are loose or broken. Make sure that both sides of any steps have handrails.  Any raised decks and porches should have guardrails on the edges.  Have any leaves, snow, or ice cleared regularly.  Use sand or salt on walking paths during winter.  Clean up any spills in your garage right away. This includes oil or grease spills. What can I do in the bathroom?  Use night lights.  Install grab bars by the toilet and in the tub and shower. Do not use towel bars as grab bars.  Use non-skid mats or decals in the tub or shower.  If you  need to sit down in the shower, use a plastic, non-slip stool.  Keep the floor dry. Clean up any water that spills on the floor as soon as it happens.  Remove soap buildup in the tub or shower regularly.  Attach bath mats securely with double-sided non-slip rug tape.  Do not have throw rugs and other things on the floor that can make you trip. What can I do in the bedroom?  Use night lights.  Make sure that you have a light by your bed that is easy to reach.  Do not use any sheets or blankets that are too big for your bed. They should not hang down onto the floor.  Have a firm chair that has side arms. You can use this for support while you get dressed.  Do not have throw rugs and other things on the floor that can make you trip. What can I do in the kitchen?  Clean up any spills right away.  Avoid walking on wet floors.  Keep items that you use a lot in easy-to-reach places.  If you need to reach something above you, use a strong step stool that has a  grab bar.  Keep electrical cords out of the way.  Do not use floor polish or wax that makes floors slippery. If you must use wax, use non-skid floor wax.  Do not have throw rugs and other things on the floor that can make you trip. What can I do with my stairs?  Do not leave any items on the stairs.  Make sure that there are handrails on both sides of the stairs and use them. Fix handrails that are broken or loose. Make sure that handrails are as long as the stairways.  Check any carpeting to make sure that it is firmly attached to the stairs. Fix any carpet that is loose or worn.  Avoid having throw rugs at the top or bottom of the stairs. If you do have throw rugs, attach them to the floor with carpet tape.  Make sure that you have a light switch at the top of the stairs and the bottom of the stairs. If you do not have them, ask someone to add them for you. What else can I do to help prevent falls?  Wear shoes  that:  Do not have high heels.  Have rubber bottoms.  Are comfortable and fit you well.  Are closed at the toe. Do not wear sandals.  If you use a stepladder:  Make sure that it is fully opened. Do not climb a closed stepladder.  Make sure that both sides of the stepladder are locked into place.  Ask someone to hold it for you, if possible.  Clearly mark and make sure that you can see:  Any grab bars or handrails.  First and last steps.  Where the edge of each step is.  Use tools that help you move around (mobility aids) if they are needed. These include:  Canes.  Walkers.  Scooters.  Crutches.  Turn on the lights when you go into a dark area. Replace any light bulbs as soon as they burn out.  Set up your furniture so you have a clear path. Avoid moving your furniture around.  If any of your floors are uneven, fix them.  If there are any pets around you, be aware of where they are.  Review your medicines with your doctor. Some medicines can make you feel dizzy. This can increase your chance of falling. Ask your doctor what other things that you can do to help prevent falls. This information is not intended to replace advice given to you by your health care provider. Make sure you discuss any questions you have with your health care provider. Document Released: 02/08/2009 Document Revised: 09/20/2015 Document Reviewed: 05/19/2014 Elsevier Interactive Patient Education  2017 [employer].

## 2020-03-15 NOTE — Progress Notes (Addendum)
Anesthesia Chart Review   Case: [id number] Date/Time: 03/20/20 1015   Procedure: Resection right total knee arthroplasty and placement of antibiotic spacer (Right Knee) - 90 mins   Anesthesia type: Spinal   Pre-op diagnosis: Infected Right total knee arthroplasty   Location: [facility] ROOM 09 / [facility]   Surgeons: Paralee Cancel, MD      DISCUSSION:64 y.o. former smoker (5 pack years, quit 01/05/16) with h/o HTN, CAD, alcoholic liver cirrhosis, polysubstance abuse, CKD Stage III, infected right total knee arthroplasty scheduled for above procedure 03/20/2020 with Dr. Paralee Cancel.   Followed by ID for recurrent bacteremia with multiple hospital admissions. Group A strep bacteremia in February 2019, MSSA bacteremia in January 2020 and MRSA bacteremia in May 2020 of epidural abscess of lumbar spine. Per [facility]ase ID note 02/29/2020, "It seems as though his back pain is better.  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  He will stay on doxycycline for now."  Hemoglobin 7.9, Dr. Alvan Dame informed.   Discussed with Dr. Kalman Shan.  Pt not a candidate for spinal due to previous epidural abscess and uncertainty of resolution.  Hemoglobin also needs to be addressed before proceeding.  Discussed with Dr. Aurea Graff scheduler.  VS: BP 126/61   Pulse 79   Temp 36.5 C (Oral)   Resp 16   Ht 5\' 7"  (1.702 m)   Wt 60.8 kg   SpO2 100%   BMI 20.99 kg/m   PROVIDERS: [name], MD is PCP   Jenkins Rouge, MD is Cardiologist  LABS: Labs reviewed: Acceptable for surgery. (all labs ordered are listed, but only abnormal results are displayed)  Labs Reviewed  CBC - Abnormal; Notable for the following components:      Result Value   RBC 2.49 (*)    Hemoglobin 7.9 (*)    HCT 24.1 (*)    RDW 15.9 (*)    All other components within normal limits  BASIC METABOLIC PANEL - Abnormal; Notable for the following components:   CO2 21 (*)    BUN 53 (*)     Creatinine, Ser 1.74 (*)    Calcium 8.5 (*)    GFR, Estimated 43 (*)    All other components within normal limits  SURGICAL PCR SCREEN     IMAGES:   EKG: 03/13/2020 Rate 75 bpm  Normal sinus rhythm Septal infarct , age undetermined Abnormal ECG No significant change since last tracing  CV: Echo 09/22/2018 IMPRESSIONS    1. The left ventricle has normal systolic function with an ejection  fraction of 60-65%. The cavity size was normal. There is mildly increased  left ventricular wall thickness. Left ventricular diastolic parameters  were normal.  2. The right ventricle has normal systolic function. The cavity was  normal. There is no increase in right ventricular wall thickness.  3. Left atrial size was severely dilated.  4. No evidence of mitral valve stenosis.  5. Mild thickening of the aortic valve. Mild calcification of the aortic  valve. No stenosis of the aortic valve.  Past Medical History:  Diagnosis Date  . Alcohol abuse   . Anemia   . Arthritis   . Ascites   . Cirrhosis ([place])   . Coffee ground emesis   . Dehydration 06/17/2017  . Febrile illness   . Heart murmur   . Hyperlipidemia   . Hypertension   . Leg swelling   . Myocardial infarction (  [place]) 2012  . Preop cardiovascular exam 04/14/2013  . Sepsis ([place]) 06/17/2017  . Septic shock ([place]) 06/18/2017  . SIRS (systemic inflammatory response syndrome) ([place]) 07/11/2017  . Stroke [facility]) 2012   no deficits  . Thrombocytopenia ([place])     Past Surgical History:  Procedure Laterality Date  . COLONOSCOPY WITH PROPOFOL N/A 02/07/2016   Procedure: COLONOSCOPY WITH PROPOFOL;  Surgeon: Milus Banister, MD;  Location: [facility];  Service: Endoscopy;  Laterality: N/A;  . ESOPHAGOGASTRODUODENOSCOPY (EGD) WITH PROPOFOL N/A 02/07/2016   Procedure: ESOPHAGOGASTRODUODENOSCOPY (EGD) WITH PROPOFOL;  Surgeon: Milus Banister, MD;  Location: [facility];  Service: Endoscopy;  Laterality: N/A;  .  ESOPHAGOGASTRODUODENOSCOPY (EGD) WITH PROPOFOL N/A 06/22/2017   Procedure: ESOPHAGOGASTRODUODENOSCOPY (EGD) WITH PROPOFOL;  Surgeon: Jerene Bears, MD;  Location: [facility] ENDOSCOPY;  Service: Gastroenterology;  Laterality: N/A;  . HERNIA REPAIR Right    inguinal  . KNEE ARTHROSCOPY     bilateral/  12/14  . TEE WITHOUT CARDIOVERSION N/A 05/13/2018   Procedure: TRANSESOPHAGEAL ECHOCARDIOGRAM (TEE);  Surgeon: [name], MD;  Location: [facility] ENDOSCOPY;  Service: Cardiovascular;  Laterality: N/A;  . TOTAL KNEE ARTHROPLASTY Right 05/02/2013   Procedure: RIGHT TOTAL KNEE ARTHROPLASTY;  Surgeon: Mauri Pole, MD;  Location: [facility];  Service: Orthopedics;  Laterality: Right;    MEDICATIONS: . albuterol (PROVENTIL HFA;VENTOLIN HFA) 108 (90 Base) MCG/ACT inhaler  . doxycycline (VIBRA-TABS) 100 MG tablet  . folic acid (FOLVITE) 1 MG tablet  . furosemide (LASIX) 20 MG tablet  . ibuprofen (ADVIL) 400 MG tablet  . ketoconazole (NIZORAL) 2 % shampoo  . lactulose (CHRONULAC) 10 GM/15ML solution  . Multiple Vitamin (MULTIVITAMIN WITH MINERALS) TABS tablet  . nadolol (CORGARD) 20 MG tablet  . naproxen sodium (ALEVE) 220 MG tablet  . ondansetron (ZOFRAN) 4 MG tablet  . oxyCODONE (OXY IR/ROXICODONE) 5 MG immediate release tablet  . pantoprazole (PROTONIX) 40 MG tablet  . spironolactone (ALDACTONE) 50 MG tablet  . thiamine (VITAMIN B-1) 100 MG tablet  . thiamine 100 MG tablet  . traMADol (ULTRAM) 50 MG tablet  . triamcinolone (KENALOG) 0.025 % ointment   No current facility-administered medications for this encounter.     Konrad Felix, PA-C [facility] [phone]

## 2020-03-16 ENCOUNTER — Other Ambulatory Visit (HOSPITAL_COMMUNITY): Payer: Medicare Other

## 2020-03-16 NOTE — Anesthesia Preprocedure Evaluation (Addendum)
Anesthesia Evaluation  Patient identified by MRN, date of birth, ID band Patient awake    Reviewed: Allergy & Precautions, NPO status , Patient's Chart, lab work & pertinent test results  History of Anesthesia Complications Negative for: history of anesthetic complications  Airway Mallampati: II  TM Distance: >3 FB Neck ROM: Full    Dental  (+) Edentulous Upper, Edentulous Lower   Pulmonary neg pulmonary ROS, former smoker,    Pulmonary exam normal        Cardiovascular hypertension, + CAD and + Past MI  Normal cardiovascular exam     Neuro/Psych CVA negative psych ROS   GI/Hepatic PUD, GERD  ,(+) Cirrhosis   Esophageal Varices and ascites  substance abuse  alcohol use,   Endo/Other  negative endocrine ROS  Renal/GU Renal InsufficiencyRenal disease  negative genitourinary   Musculoskeletal  (+) Arthritis ,   Abdominal   Peds  Hematology  (+) anemia , Chronic anemia per PCP. Range 7.5-8.5 for over a year.  Primarily iron deficiency   Anesthesia Other Findings  HTN, CAD, cirrhosis w/ ascites, anemia (Hgb 7.9), CKD (Cr 1.74), multiple recent episodes of bacteremia, h/o vertebral/epidural MRSA infection  Reproductive/Obstetrics                           Anesthesia Physical Anesthesia Plan  ASA: III  Anesthesia Plan: General   Post-op Pain Management:    Induction: Intravenous  PONV Risk Score and Plan: 2 and Ondansetron, Dexamethasone, Treatment may vary due to age or medical condition and Midazolam  Airway Management Planned: Oral ETT  Additional Equipment: None  Intra-op Plan:   Post-operative Plan: Extubation in OR  Informed Consent: I have reviewed the patients History and Physical, chart, labs and discussed the procedure including the risks, benefits and alternatives for the proposed anesthesia with the patient or authorized representative who has indicated his/her  understanding and acceptance.     Dental advisory given  Plan Discussed with:   Anesthesia Plan Comments:        Anesthesia Quick Evaluation

## 2020-03-19 ENCOUNTER — Inpatient Hospital Stay (HOSPITAL_COMMUNITY): Admission: RE | Admit: 2020-03-19 | Payer: BC Managed Care – PPO | Source: Ambulatory Visit

## 2020-03-19 ENCOUNTER — Encounter (HOSPITAL_COMMUNITY): Payer: Self-pay | Admitting: Orthopedic Surgery

## 2020-03-19 NOTE — H&P (Signed)
INFECTED TOTAL KNEE ADMISSION H&P  Patient is admitted for infected right total knee arthroplasty.  Subjective:  Chief Complaint:  Right knee infection in the presence of TKA  Procedure: Right TKA resection with debridement and placement of a cement antibiotic spacer  HPI:  The patient is a 64 y.o. male with a history of a TKA in the right knee in 2015 by Dr. Alvan Dame.  Recently has been seen with pain and swelling.  Further work up has revealed an infection in the right knee.  Patient has very poor dentition and will also need dental work preformed, as this is concern for his infected knee.   Onset of symptoms was abrupt starting 2 months ago with rapidlly worsening course since that time. The patient noted prior procedures on the knee to include  arthroplasty on the right knee.  Patient currently rates pain in the right knee at 10 out of 10 with activity. Patient has night pain, worsening of pain with activity and weight bearing, pain that interfers with activities of daily living, pain with passive range of motion, crepitus and joint swelling. Patient has evidence of previous TKA by imaging studies.  Labs reveal Sedimentation rate of 129 and C-reactive protein of 18.3 This condition presents safety issues increasing the risk of falls.   There active infection.  Risks, benefits and expectations were discussed with the patient.  Risks including but not limited to the risk of anesthesia, blood clots, nerve damage, blood vessel damage, failure of the prosthesis, infection and up to and including death.  Patient understand the risks, benefits and expectations and wishes to proceed with surgery.   D/C Plans:       Home   Post-op Meds:       No Rx given  Tranexamic Acid:      To be given - IV   Decadron:      Is to be given     Review of Systems  Constitutional: Negative.   HENT: Negative.   Eyes: Negative.   Respiratory: Negative.   Cardiovascular: Negative.   Gastrointestinal: Negative.    Genitourinary: Negative.   Musculoskeletal: Positive for joint pain.  Skin: Negative.   Neurological: Negative.   Endo/Heme/Allergies: Negative.   Psychiatric/Behavioral: Negative.       Objective:   Physical Exam Constitutional:      Appearance: He is well-developed.  HENT:     Head: Normocephalic.  Eyes:     Pupils: Pupils are equal, round, and reactive to light.  Neck:     Thyroid: No thyromegaly.     Vascular: No JVD.     Trachea: No tracheal deviation.  Cardiovascular:     Rate and Rhythm: Normal rate and regular rhythm.  Pulmonary:     Effort: Pulmonary effort is normal. No respiratory distress.     Breath sounds: Normal breath sounds. No wheezing.  Abdominal:     Palpations: Abdomen is soft.     Tenderness: There is no abdominal tenderness. There is no guarding.  Musculoskeletal:     Cervical back: Neck supple.     Right knee: Effusion present. No erythema or ecchymosis. Decreased range of motion. Tenderness present.  Lymphadenopathy:     Cervical: No cervical adenopathy.  Skin:    General: Skin is warm and dry.  Neurological:     Mental Status: He is alert and oriented to person, place, and time.        Imaging Review Plain radiographs demonstrate previous TKA of the right  knee(s). The bone quality appears to be good for age and reported activity level.  Assessment/Plan:  Infected right TKA  The patient history, physical examination, clinical judgement of the provider and labs are consistent with an infected right TKA and resection of total knee arthroplasty and placement of antibiotic spacer  is deemed medically necessary.  The risks and benefits of the procedure were presented and reviewed. The risks of infection, stiffness, dislocation/subluxation,  thromboembolic complications and other imponderables were discussed.  The patient acknowledged the explanation, agreed to proceed with the plan and consent was signed. Patient is being admitted for  inpatient treatment for surgery, pain control, PT, OT, IV antibiotics, VTE prophylaxis, progressive ambulation and ADL's and discharge planning.The patient is planning to be discharged home.     [place] Timothey Dahlstrom   PA-C  03/19/2020, 2:24 PM

## 2020-03-20 ENCOUNTER — Other Ambulatory Visit: Payer: Self-pay

## 2020-03-20 ENCOUNTER — Inpatient Hospital Stay: Payer: Self-pay

## 2020-03-20 ENCOUNTER — Encounter (HOSPITAL_COMMUNITY)
Admission: RE | Disposition: A | Discharge status: Home Health | Payer: Self-pay | Source: Ambulatory Visit | Attending: Orthopedic Surgery

## 2020-03-20 ENCOUNTER — Inpatient Hospital Stay (HOSPITAL_COMMUNITY): Payer: Medicare Other | Admitting: Physician Assistant

## 2020-03-20 ENCOUNTER — Inpatient Hospital Stay (HOSPITAL_COMMUNITY)
Admission: RE | Admit: 2020-03-20 | Discharge: 2020-03-23 | DRG: 467 | Disposition: A | Discharge status: Home Health | Payer: Medicare Other | Source: Ambulatory Visit | Attending: Orthopedic Surgery | Admitting: Orthopedic Surgery

## 2020-03-20 ENCOUNTER — Encounter (HOSPITAL_COMMUNITY): Payer: Self-pay | Admitting: Orthopedic Surgery

## 2020-03-20 DIAGNOSIS — Z87891 Personal history of nicotine dependence: Secondary | ICD-10-CM | POA: Diagnosis not present

## 2020-03-20 DIAGNOSIS — I129 Hypertensive chronic kidney disease with stage 1 through stage 4 chronic kidney disease, or unspecified chronic kidney disease: Secondary | ICD-10-CM | POA: Diagnosis present

## 2020-03-20 DIAGNOSIS — B9561 Methicillin susceptible Staphylococcus aureus infection as the cause of diseases classified elsewhere: Secondary | ICD-10-CM | POA: Diagnosis not present

## 2020-03-20 DIAGNOSIS — T8453XA Infection and inflammatory reaction due to internal right knee prosthesis, initial encounter: Principal | ICD-10-CM | POA: Diagnosis present

## 2020-03-20 DIAGNOSIS — B9562 Methicillin resistant Staphylococcus aureus infection as the cause of diseases classified elsewhere: Secondary | ICD-10-CM | POA: Diagnosis present

## 2020-03-20 DIAGNOSIS — I252 Old myocardial infarction: Secondary | ICD-10-CM

## 2020-03-20 DIAGNOSIS — F1990 Other psychoactive substance use, unspecified, uncomplicated: Secondary | ICD-10-CM | POA: Diagnosis not present

## 2020-03-20 DIAGNOSIS — Y831 Surgical operation with implant of artificial internal device as the cause of abnormal reaction of the patient, or of later complication, without mention of misadventure at the time of the procedure: Secondary | ICD-10-CM | POA: Diagnosis present

## 2020-03-20 DIAGNOSIS — R7881 Bacteremia: Secondary | ICD-10-CM

## 2020-03-20 DIAGNOSIS — T8454XD Infection and inflammatory reaction due to internal left knee prosthesis, subsequent encounter: Secondary | ICD-10-CM

## 2020-03-20 DIAGNOSIS — N189 Chronic kidney disease, unspecified: Secondary | ICD-10-CM | POA: Diagnosis present

## 2020-03-20 DIAGNOSIS — G062 Extradural and subdural abscess, unspecified: Secondary | ICD-10-CM | POA: Diagnosis not present

## 2020-03-20 DIAGNOSIS — K7030 Alcoholic cirrhosis of liver without ascites: Secondary | ICD-10-CM | POA: Diagnosis present

## 2020-03-20 DIAGNOSIS — D631 Anemia in chronic kidney disease: Secondary | ICD-10-CM | POA: Diagnosis present

## 2020-03-20 DIAGNOSIS — Z20822 Contact with and (suspected) exposure to covid-19: Secondary | ICD-10-CM | POA: Diagnosis present

## 2020-03-20 DIAGNOSIS — T8454XA Infection and inflammatory reaction due to internal left knee prosthesis, initial encounter: Secondary | ICD-10-CM

## 2020-03-20 DIAGNOSIS — Z8673 Personal history of transient ischemic attack (TIA), and cerebral infarction without residual deficits: Secondary | ICD-10-CM

## 2020-03-20 DIAGNOSIS — D62 Acute posthemorrhagic anemia: Secondary | ICD-10-CM | POA: Diagnosis not present

## 2020-03-20 DIAGNOSIS — F1020 Alcohol dependence, uncomplicated: Secondary | ICD-10-CM | POA: Diagnosis present

## 2020-03-20 DIAGNOSIS — N183 Chronic kidney disease, stage 3 unspecified: Secondary | ICD-10-CM

## 2020-03-20 DIAGNOSIS — E785 Hyperlipidemia, unspecified: Secondary | ICD-10-CM | POA: Diagnosis present

## 2020-03-20 DIAGNOSIS — M1710 Unilateral primary osteoarthritis, unspecified knee: Secondary | ICD-10-CM

## 2020-03-20 DIAGNOSIS — M00062 Staphylococcal arthritis, left knee: Secondary | ICD-10-CM | POA: Diagnosis not present

## 2020-03-20 HISTORY — PX: EXCISIONAL TOTAL KNEE ARTHROPLASTY WITH ANTIBIOTIC SPACERS: SHX5827

## 2020-03-20 HISTORY — DX: Personal history of other medical treatment: Z92.89

## 2020-03-20 LAB — SARS CORONAVIRUS 2 BY RT PCR (HOSPITAL ORDER, PERFORMED IN ~~LOC~~ HOSPITAL LAB): SARS Coronavirus 2: NEGATIVE

## 2020-03-20 LAB — PREPARE RBC (CROSSMATCH)

## 2020-03-20 SURGERY — REMOVAL, TOTAL ARTHROPLASTY HARDWARE, KNEE, WITH ANTIBIOTIC SPACER INSERTION
Anesthesia: General | Site: Knee | Laterality: Right

## 2020-03-20 MED ORDER — HYDROMORPHONE HCL 1 MG/ML IJ SOLN
INTRAMUSCULAR | Status: AC
  Administered 2020-03-20: 0.25 mg via INTRAVENOUS
  Filled 2020-03-20: qty 1

## 2020-03-20 MED ORDER — METHOCARBAMOL 500 MG IVPB - SIMPLE MED
500.0000 mg | Freq: Four times a day (QID) | INTRAVENOUS | Status: DC | PRN
  Administered 2020-03-20: 500 mg via INTRAVENOUS
  Filled 2020-03-20: qty 50

## 2020-03-20 MED ORDER — ASPIRIN 81 MG PO CHEW
81.0000 mg | CHEWABLE_TABLET | Freq: Two times a day (BID) | ORAL | Status: DC
  Administered 2020-03-21 – 2020-03-23 (×5): 81 mg via ORAL
  Filled 2020-03-20 (×5): qty 1

## 2020-03-20 MED ORDER — POLYETHYLENE GLYCOL 3350 17 G PO PACK
17.0000 g | PACK | Freq: Two times a day (BID) | ORAL | Status: DC
  Administered 2020-03-20 – 2020-03-22 (×4): 17 g via ORAL
  Filled 2020-03-20 (×6): qty 1

## 2020-03-20 MED ORDER — SODIUM CHLORIDE 0.9 % IR SOLN
Status: DC | PRN
  Administered 2020-03-20: 6000 mL

## 2020-03-20 MED ORDER — CEFAZOLIN SODIUM-DEXTROSE 2-4 GM/100ML-% IV SOLN
INTRAVENOUS | Status: AC
  Filled 2020-03-20: qty 100

## 2020-03-20 MED ORDER — HYDROCODONE-ACETAMINOPHEN 5-325 MG PO TABS: 1.0000 | ORAL_TABLET | ORAL | Status: DC | PRN

## 2020-03-20 MED ORDER — FOLIC ACID 1 MG PO TABS
1.0000 mg | ORAL_TABLET | Freq: Every day | ORAL | Status: DC
  Administered 2020-03-21 – 2020-03-23 (×3): 1 mg via ORAL
  Filled 2020-03-20 (×3): qty 1

## 2020-03-20 MED ORDER — PROPOFOL 10 MG/ML IV BOLUS
INTRAVENOUS | Status: AC
  Filled 2020-03-20: qty 20

## 2020-03-20 MED ORDER — VANCOMYCIN HCL IN DEXTROSE 1-5 GM/200ML-% IV SOLN: 1000.0000 mg | INTRAVENOUS | Status: DC

## 2020-03-20 MED ORDER — ESMOLOL HCL 100 MG/10ML IV SOLN
INTRAVENOUS | Status: AC
  Filled 2020-03-20: qty 10

## 2020-03-20 MED ORDER — PHENOL 1.4 % MT LIQD: 1.0000 | OROMUCOSAL | Status: DC | PRN

## 2020-03-20 MED ORDER — SODIUM CHLORIDE 0.9 % IV SOLN
500.0000 mg | Freq: Every day | INTRAVENOUS | Status: DC
  Administered 2020-03-20 – 2020-03-22 (×3): 500 mg via INTRAVENOUS
  Filled 2020-03-20 (×3): qty 10

## 2020-03-20 MED ORDER — SODIUM CHLORIDE 0.9 % IV SOLN: INTRAVENOUS | Status: DC

## 2020-03-20 MED ORDER — ESMOLOL HCL 100 MG/10ML IV SOLN
INTRAVENOUS | Status: DC | PRN
  Administered 2020-03-20 (×4): 20 mg via INTRAVENOUS

## 2020-03-20 MED ORDER — FENTANYL CITRATE (PF) 100 MCG/2ML IJ SOLN
INTRAMUSCULAR | Status: AC
  Filled 2020-03-20: qty 2

## 2020-03-20 MED ORDER — CHLORHEXIDINE GLUCONATE 0.12 % MT SOLN
15.0000 mL | Freq: Once | OROMUCOSAL | Status: AC
  Administered 2020-03-20: 15 mL via OROMUCOSAL

## 2020-03-20 MED ORDER — DEXAMETHASONE SODIUM PHOSPHATE 10 MG/ML IJ SOLN
10.0000 mg | Freq: Once | INTRAMUSCULAR | Status: AC
  Administered 2020-03-21: 10 mg via INTRAVENOUS
  Filled 2020-03-20: qty 1

## 2020-03-20 MED ORDER — SODIUM CHLORIDE 0.9% IV SOLUTION: Freq: Once | INTRAVENOUS | Status: DC

## 2020-03-20 MED ORDER — PROPOFOL 10 MG/ML IV BOLUS
INTRAVENOUS | Status: DC | PRN
  Administered 2020-03-20: 200 mg via INTRAVENOUS

## 2020-03-20 MED ORDER — LIDOCAINE HCL (CARDIAC) PF 100 MG/5ML IV SOSY
PREFILLED_SYRINGE | INTRAVENOUS | Status: DC | PRN
  Administered 2020-03-20: 60 mg via INTRAVENOUS

## 2020-03-20 MED ORDER — PHENYLEPHRINE 40 MCG/ML (10ML) SYRINGE FOR IV PUSH (FOR BLOOD PRESSURE SUPPORT)
PREFILLED_SYRINGE | INTRAVENOUS | Status: AC
  Filled 2020-03-20: qty 10

## 2020-03-20 MED ORDER — ALUM & MAG HYDROXIDE-SIMETH 200-200-20 MG/5ML PO SUSP: 15.0000 mL | ORAL | Status: DC | PRN

## 2020-03-20 MED ORDER — ACETAMINOPHEN 325 MG PO TABS
325.0000 mg | ORAL_TABLET | Freq: Four times a day (QID) | ORAL | Status: DC | PRN
  Administered 2020-03-21: 325 mg via ORAL
  Filled 2020-03-20: qty 2

## 2020-03-20 MED ORDER — 0.9 % SODIUM CHLORIDE (POUR BTL) OPTIME
TOPICAL | Status: DC | PRN
  Administered 2020-03-20: 1000 mL

## 2020-03-20 MED ORDER — PANTOPRAZOLE SODIUM 40 MG PO TBEC
40.0000 mg | DELAYED_RELEASE_TABLET | Freq: Every day | ORAL | Status: DC
  Administered 2020-03-20 – 2020-03-23 (×4): 40 mg via ORAL
  Filled 2020-03-20 (×4): qty 1

## 2020-03-20 MED ORDER — VANCOMYCIN HCL 1000 MG IV SOLR
INTRAVENOUS | Status: AC
  Filled 2020-03-20: qty 1000

## 2020-03-20 MED ORDER — OXYCODONE HCL 5 MG/5ML PO SOLN: 5.0000 mg | Freq: Once | ORAL | Status: DC | PRN

## 2020-03-20 MED ORDER — MIDAZOLAM HCL 2 MG/2ML IJ SOLN
INTRAMUSCULAR | Status: AC
  Filled 2020-03-20: qty 2

## 2020-03-20 MED ORDER — EPHEDRINE SULFATE-NACL 50-0.9 MG/10ML-% IV SOSY
PREFILLED_SYRINGE | INTRAVENOUS | Status: DC | PRN
  Administered 2020-03-20: 10 mg via INTRAVENOUS

## 2020-03-20 MED ORDER — ONDANSETRON HCL 4 MG/2ML IJ SOLN
INTRAMUSCULAR | Status: AC
  Filled 2020-03-20: qty 2

## 2020-03-20 MED ORDER — MAGNESIUM CITRATE PO SOLN: 1.0000 | Freq: Once | ORAL | Status: DC | PRN

## 2020-03-20 MED ORDER — ONDANSETRON HCL 4 MG/2ML IJ SOLN: 4.0000 mg | Freq: Four times a day (QID) | INTRAMUSCULAR | Status: DC | PRN

## 2020-03-20 MED ORDER — METHOCARBAMOL 500 MG PO TABS
500.0000 mg | ORAL_TABLET | Freq: Four times a day (QID) | ORAL | Status: DC | PRN
  Administered 2020-03-20 – 2020-03-22 (×3): 500 mg via ORAL
  Filled 2020-03-20 (×3): qty 1

## 2020-03-20 MED ORDER — NADOLOL 20 MG PO TABS
20.0000 mg | ORAL_TABLET | Freq: Every day | ORAL | Status: DC
  Administered 2020-03-21 – 2020-03-23 (×3): 20 mg via ORAL
  Filled 2020-03-20 (×3): qty 1

## 2020-03-20 MED ORDER — LIDOCAINE 2% (20 MG/ML) 5 ML SYRINGE
INTRAMUSCULAR | Status: AC
  Filled 2020-03-20: qty 5

## 2020-03-20 MED ORDER — ORAL CARE MOUTH RINSE: 15.0000 mL | Freq: Once | OROMUCOSAL | Status: AC

## 2020-03-20 MED ORDER — VANCOMYCIN HCL 1000 MG IV SOLR: 1000.0000 mg | INTRAVENOUS | Status: DC

## 2020-03-20 MED ORDER — LACTULOSE 10 GM/15ML PO SOLN
30.0000 g | Freq: Two times a day (BID) | ORAL | Status: DC
  Administered 2020-03-21 – 2020-03-23 (×2): 30 g via ORAL
  Filled 2020-03-20 (×5): qty 45

## 2020-03-20 MED ORDER — TRANEXAMIC ACID-NACL 1000-0.7 MG/100ML-% IV SOLN
1000.0000 mg | INTRAVENOUS | Status: AC
  Administered 2020-03-20: 1000 mg via INTRAVENOUS
  Filled 2020-03-20: qty 100

## 2020-03-20 MED ORDER — HYDROMORPHONE HCL 1 MG/ML IJ SOLN
0.5000 mg | INTRAMUSCULAR | Status: DC | PRN
  Administered 2020-03-20 (×2): 1 mg via INTRAVENOUS
  Administered 2020-03-21: 0.5 mg via INTRAVENOUS
  Filled 2020-03-20 (×3): qty 1

## 2020-03-20 MED ORDER — BISACODYL 10 MG RE SUPP: 10.0000 mg | Freq: Every day | RECTAL | Status: DC | PRN

## 2020-03-20 MED ORDER — VANCOMYCIN HCL 1000 MG IV SOLR
INTRAVENOUS | Status: AC
  Filled 2020-03-20: qty 2000

## 2020-03-20 MED ORDER — DEXAMETHASONE SODIUM PHOSPHATE 10 MG/ML IJ SOLN
INTRAMUSCULAR | Status: AC
  Filled 2020-03-20: qty 1

## 2020-03-20 MED ORDER — ALBUMIN HUMAN 5 % IV SOLN: INTRAVENOUS | Status: DC | PRN

## 2020-03-20 MED ORDER — SUGAMMADEX SODIUM 200 MG/2ML IV SOLN
INTRAVENOUS | Status: DC | PRN
  Administered 2020-03-20: 150 mg via INTRAVENOUS

## 2020-03-20 MED ORDER — VANCOMYCIN HCL 1000 MG IV SOLR
INTRAVENOUS | Status: AC
  Filled 2020-03-20: qty 3000

## 2020-03-20 MED ORDER — DOCUSATE SODIUM 100 MG PO CAPS
100.0000 mg | ORAL_CAPSULE | Freq: Two times a day (BID) | ORAL | Status: DC
  Administered 2020-03-20 – 2020-03-23 (×6): 100 mg via ORAL
  Filled 2020-03-20 (×6): qty 1

## 2020-03-20 MED ORDER — VANCOMYCIN HCL 1000 MG IV SOLR
1000.0000 mg | Freq: Two times a day (BID) | INTRAVENOUS | Status: DC
  Administered 2020-03-20: 1000 mg via INTRAVENOUS

## 2020-03-20 MED ORDER — VANCOMYCIN HCL 1000 MG IV SOLR
INTRAVENOUS | Status: DC | PRN
  Administered 2020-03-20: 6000 mg

## 2020-03-20 MED ORDER — TOBRAMYCIN SULFATE 1.2 G IJ SOLR
INTRAMUSCULAR | Status: AC
  Filled 2020-03-20: qty 7.2

## 2020-03-20 MED ORDER — FERROUS SULFATE 325 (65 FE) MG PO TABS
325.0000 mg | ORAL_TABLET | Freq: Two times a day (BID) | ORAL | Status: DC
  Administered 2020-03-21 – 2020-03-23 (×5): 325 mg via ORAL
  Filled 2020-03-20 (×5): qty 1

## 2020-03-20 MED ORDER — ONDANSETRON HCL 4 MG PO TABS: 4.0000 mg | ORAL_TABLET | Freq: Four times a day (QID) | ORAL | Status: DC | PRN

## 2020-03-20 MED ORDER — PHENYLEPHRINE 40 MCG/ML (10ML) SYRINGE FOR IV PUSH (FOR BLOOD PRESSURE SUPPORT)
PREFILLED_SYRINGE | INTRAVENOUS | Status: DC | PRN
  Administered 2020-03-20: 80 ug via INTRAVENOUS
  Administered 2020-03-20: 120 ug via INTRAVENOUS
  Administered 2020-03-20: 80 ug via INTRAVENOUS
  Administered 2020-03-20 (×2): 120 ug via INTRAVENOUS

## 2020-03-20 MED ORDER — METOCLOPRAMIDE HCL 5 MG/ML IJ SOLN: 5.0000 mg | Freq: Three times a day (TID) | INTRAMUSCULAR | Status: DC | PRN

## 2020-03-20 MED ORDER — VANCOMYCIN HCL IN DEXTROSE 1-5 GM/200ML-% IV SOLN
INTRAVENOUS | Status: AC
  Filled 2020-03-20: qty 200

## 2020-03-20 MED ORDER — ROCURONIUM BROMIDE 100 MG/10ML IV SOLN
INTRAVENOUS | Status: DC | PRN
  Administered 2020-03-20: 50 mg via INTRAVENOUS

## 2020-03-20 MED ORDER — SPIRONOLACTONE 25 MG PO TABS
50.0000 mg | ORAL_TABLET | Freq: Two times a day (BID) | ORAL | Status: DC
  Administered 2020-03-20 – 2020-03-22 (×4): 50 mg via ORAL
  Filled 2020-03-20 (×4): qty 2

## 2020-03-20 MED ORDER — ONDANSETRON HCL 4 MG/2ML IJ SOLN: 4.0000 mg | Freq: Once | INTRAMUSCULAR | Status: DC | PRN

## 2020-03-20 MED ORDER — ONDANSETRON HCL 4 MG/2ML IJ SOLN
INTRAMUSCULAR | Status: DC | PRN
  Administered 2020-03-20: 4 mg via INTRAVENOUS

## 2020-03-20 MED ORDER — MENTHOL 3 MG MT LOZG: 1.0000 | LOZENGE | OROMUCOSAL | Status: DC | PRN

## 2020-03-20 MED ORDER — OXYCODONE HCL 5 MG PO TABS: 5.0000 mg | ORAL_TABLET | Freq: Once | ORAL | Status: DC | PRN

## 2020-03-20 MED ORDER — TRANEXAMIC ACID-NACL 1000-0.7 MG/100ML-% IV SOLN
1000.0000 mg | Freq: Once | INTRAVENOUS | Status: AC
  Administered 2020-03-20: 1000 mg via INTRAVENOUS
  Filled 2020-03-20: qty 100

## 2020-03-20 MED ORDER — STERILE WATER FOR IRRIGATION IR SOLN
Status: DC | PRN
  Administered 2020-03-20: 1000 mL

## 2020-03-20 MED ORDER — DEXAMETHASONE SODIUM PHOSPHATE 10 MG/ML IJ SOLN
10.0000 mg | Freq: Once | INTRAMUSCULAR | Status: AC
  Administered 2020-03-20: 10 mg via INTRAVENOUS

## 2020-03-20 MED ORDER — CELECOXIB 200 MG PO CAPS: 200.0000 mg | ORAL_CAPSULE | Freq: Two times a day (BID) | ORAL | Status: DC

## 2020-03-20 MED ORDER — LACTATED RINGERS IV SOLN: INTRAVENOUS | Status: DC | PRN

## 2020-03-20 MED ORDER — FUROSEMIDE 20 MG PO TABS
20.0000 mg | ORAL_TABLET | Freq: Two times a day (BID) | ORAL | Status: DC
  Administered 2020-03-20 – 2020-03-23 (×6): 20 mg via ORAL
  Filled 2020-03-20 (×6): qty 1

## 2020-03-20 MED ORDER — DIPHENHYDRAMINE HCL 12.5 MG/5ML PO ELIX: 12.5000 mg | ORAL_SOLUTION | ORAL | Status: DC | PRN

## 2020-03-20 MED ORDER — MIDAZOLAM HCL 2 MG/2ML IJ SOLN
1.0000 mg | INTRAMUSCULAR | Status: DC
  Administered 2020-03-20 (×2): 1 mg via INTRAVENOUS
  Filled 2020-03-20: qty 2

## 2020-03-20 MED ORDER — IRRISEPT - 450ML BOTTLE WITH 0.05% CHG IN STERILE WATER, USP 99.95% OPTIME
TOPICAL | Status: DC | PRN
  Administered 2020-03-20: 450 mL

## 2020-03-20 MED ORDER — POVIDONE-IODINE 10 % EX SWAB
2.0000 "application " | Freq: Once | CUTANEOUS | Status: AC
  Administered 2020-03-20: 2 via TOPICAL

## 2020-03-20 MED ORDER — AMISULPRIDE (ANTIEMETIC) 5 MG/2ML IV SOLN: 10.0000 mg | Freq: Once | INTRAVENOUS | Status: DC | PRN

## 2020-03-20 MED ORDER — HYDROMORPHONE HCL 1 MG/ML IJ SOLN
0.2500 mg | INTRAMUSCULAR | Status: DC | PRN
  Administered 2020-03-20: 0.25 mg via INTRAVENOUS
  Administered 2020-03-20 (×2): 0.5 mg via INTRAVENOUS
  Administered 2020-03-20: 0.25 mg via INTRAVENOUS

## 2020-03-20 MED ORDER — HYDROMORPHONE HCL 1 MG/ML IJ SOLN
INTRAMUSCULAR | Status: DC | PRN
Start: 2020-03-20 — End: 2020-03-20
  Administered 2020-03-20 (×2): 1 mg via INTRAVENOUS

## 2020-03-20 MED ORDER — VANCOMYCIN HCL 1000 MG IV SOLR
INTRAVENOUS | Status: DC | PRN
  Administered 2020-03-20: 1000 mg via TOPICAL

## 2020-03-20 MED ORDER — HYDROMORPHONE HCL 2 MG/ML IJ SOLN
INTRAMUSCULAR | Status: AC
  Filled 2020-03-20: qty 1

## 2020-03-20 MED ORDER — METOCLOPRAMIDE HCL 5 MG PO TABS: 5.0000 mg | ORAL_TABLET | Freq: Three times a day (TID) | ORAL | Status: DC | PRN

## 2020-03-20 MED ORDER — EPHEDRINE 5 MG/ML INJ
INTRAVENOUS | Status: AC
  Filled 2020-03-20: qty 10

## 2020-03-20 MED ORDER — TOBRAMYCIN SULFATE 1.2 G IJ SOLR
INTRAMUSCULAR | Status: DC | PRN
  Administered 2020-03-20: 7.2 g

## 2020-03-20 MED ORDER — FENTANYL CITRATE (PF) 100 MCG/2ML IJ SOLN
50.0000 ug | INTRAMUSCULAR | Status: DC
  Administered 2020-03-20: 50 ug via INTRAVENOUS
  Administered 2020-03-20 (×2): 100 ug via INTRAVENOUS
  Administered 2020-03-20: 50 ug via INTRAVENOUS
  Filled 2020-03-20: qty 2

## 2020-03-20 MED ORDER — LACTATED RINGERS IV SOLN: INTRAVENOUS | Status: DC

## 2020-03-20 MED ORDER — METHOCARBAMOL 500 MG IVPB - SIMPLE MED
INTRAVENOUS | Status: AC
  Filled 2020-03-20: qty 50

## 2020-03-20 MED ORDER — HYDROCODONE-ACETAMINOPHEN 7.5-325 MG PO TABS
1.0000 | ORAL_TABLET | ORAL | Status: DC | PRN
  Administered 2020-03-20 – 2020-03-23 (×11): 2 via ORAL
  Filled 2020-03-20 (×12): qty 2

## 2020-03-20 SURGICAL SUPPLY — 53 items
ADH SKN CLS APL DERMABOND .7 (GAUZE/BANDAGES/DRESSINGS) ×1
BAG SPEC THK2 15X12 ZIP CLS (MISCELLANEOUS) ×1
BAG ZIPLOCK 12X15 (MISCELLANEOUS) ×2 IMPLANT
BNDG CMPR MED 10X6 ELC LF (GAUZE/BANDAGES/DRESSINGS) ×1
BNDG ELASTIC 6X10 VLCR STRL LF (GAUZE/BANDAGES/DRESSINGS) ×1 IMPLANT
BNDG ELASTIC 6X5.8 VLCR STR LF (GAUZE/BANDAGES/DRESSINGS) ×2 IMPLANT
BOWL SMART MIX CTS (DISPOSABLE) ×2 IMPLANT
BRUSH FEMORAL CANAL (MISCELLANEOUS) ×2 IMPLANT
CEMENT HV SMART SET (Cement) ×9 IMPLANT
COVER SURGICAL LIGHT HANDLE (MISCELLANEOUS) ×2 IMPLANT
CUFF TOURN SGL QUICK 34 (TOURNIQUET CUFF) ×2
CUFF TRNQT CYL 34X4.125X (TOURNIQUET CUFF) ×1 IMPLANT
DERMABOND ADVANCED (GAUZE/BANDAGES/DRESSINGS) ×1
DERMABOND ADVANCED .7 DNX12 (GAUZE/BANDAGES/DRESSINGS) ×1 IMPLANT
DRAPE U-SHAPE 47X51 STRL (DRAPES) ×2 IMPLANT
DRESSING AQUACEL AG SP 3.5X10 (GAUZE/BANDAGES/DRESSINGS) ×1 IMPLANT
DRSG AQUACEL AG ADV 3.5X10 (GAUZE/BANDAGES/DRESSINGS) ×1 IMPLANT
DRSG AQUACEL AG SP 3.5X10 (GAUZE/BANDAGES/DRESSINGS) ×2
DURAPREP 26ML APPLICATOR (WOUND CARE) ×4 IMPLANT
ELECT REM PT RETURN 15FT ADLT (MISCELLANEOUS) ×2 IMPLANT
GLOVE BIO SURGEON STRL SZ 6 (GLOVE) ×4 IMPLANT
GLOVE BIOGEL PI IND STRL 6.5 (GLOVE) ×1 IMPLANT
GLOVE BIOGEL PI IND STRL 8.5 (GLOVE) ×1 IMPLANT
GLOVE BIOGEL PI INDICATOR 6.5 (GLOVE) ×1
GLOVE BIOGEL PI INDICATOR 8.5 (GLOVE) ×1
GLOVE ECLIPSE 8.0 STRL XLNG CF (GLOVE) ×4 IMPLANT
GLOVE INDICATOR 7.5 STRL GRN (GLOVE) ×2 IMPLANT
GLOVE ORTHO TXT STRL SZ7.5 (GLOVE) ×4 IMPLANT
GOWN STRL REUS W/TWL LRG LVL3 (GOWN DISPOSABLE) ×2 IMPLANT
GOWN STRL REUS W/TWL XL LVL3 (GOWN DISPOSABLE) ×2 IMPLANT
HANDPIECE INTERPULSE COAX TIP (DISPOSABLE) ×2
JET LAVAGE IRRISEPT WOUND (IRRIGATION / IRRIGATOR) ×2
KIT TURNOVER KIT A (KITS) IMPLANT
LAVAGE JET IRRISEPT WOUND (IRRIGATION / IRRIGATOR) ×1 IMPLANT
MANIFOLD NEPTUNE II (INSTRUMENTS) ×2 IMPLANT
NS IRRIG 1000ML POUR BTL (IV SOLUTION) ×2 IMPLANT
PACK TOTAL KNEE CUSTOM (KITS) ×2 IMPLANT
PENCIL SMOKE EVACUATOR (MISCELLANEOUS) IMPLANT
PROTECTOR NERVE ULNAR (MISCELLANEOUS) ×2 IMPLANT
SET HNDPC FAN SPRY TIP SCT (DISPOSABLE) ×1 IMPLANT
SET PAD KNEE POSITIONER (MISCELLANEOUS) ×2 IMPLANT
SPACER FEM 44 A/P 67 M/L MED (Miscellaneous) ×1 IMPLANT
SPACER TIB 45 A/P 70 M/L MED (Miscellaneous) ×1 IMPLANT
STAPLER VISISTAT 35W (STAPLE) IMPLANT
SUT MNCRL AB 3-0 PS2 18 (SUTURE) ×2 IMPLANT
SUT MNCRL AB 4-0 PS2 18 (SUTURE) ×1 IMPLANT
SUT STRATAFIX PDS+ 0 24IN (SUTURE) ×2 IMPLANT
SUT VIC AB 1 CT1 36 (SUTURE) ×4 IMPLANT
SUT VIC AB 2-0 CT1 27 (SUTURE) ×6
SUT VIC AB 2-0 CT1 TAPERPNT 27 (SUTURE) ×3 IMPLANT
TRAY FOLEY MTR SLVR 16FR STAT (SET/KITS/TRAYS/PACK) ×2 IMPLANT
WATER STERILE IRR 1000ML POUR (IV SOLUTION) ×2 IMPLANT
WRAP KNEE MAXI GEL POST OP (GAUZE/BANDAGES/DRESSINGS) ×2 IMPLANT

## 2020-03-20 NOTE — Brief Op Note (Signed)
03/20/2020  11:27 AM  PATIENT:  [name]  64 y.o. male  PRE-OPERATIVE DIAGNOSIS:  Infected Right total knee arthroplasty  POST-OPERATIVE DIAGNOSIS:  Infected Right total knee arthroplasty  PROCEDURE:  Procedure(s) with comments: Resection right total knee arthroplasty and placement of antibiotic spacer (Right) - 90 mins  SURGEON:  Surgeon(s) and Role:    Paralee Cancel, MD - Primary  PHYSICIAN ASSISTANT: Griffith Citron, PA-C  ANESTHESIA:   regional and general  EBL:  <200 cc  BLOOD ADMINISTERED:none  DRAINS: none   LOCAL MEDICATIONS USED:  NONE  SPECIMEN:  No Specimen  DISPOSITION OF SPECIMEN:  N/A  COUNTS:  YES  TOURNIQUET:  60 min at 244mmHg  DICTATION: .Other Dictation: Dictation Number [id number]  PLAN OF CARE: Admit to inpatient   PATIENT DISPOSITION:  PACU - hemodynamically stable.   Delay start of Pharmacological VTE agent (>24hrs) due to surgical blood loss or risk of bleeding: no

## 2020-03-20 NOTE — Progress Notes (Signed)
LABS obtained on 03/01/20 At [facility]

## 2020-03-20 NOTE — Transfer of Care (Signed)
Immediate Anesthesia Transfer of Care Note  Patient: Mark [name]  Procedure(s) Performed: Resection right total knee arthroplasty and placement of antibiotic spacer (Right Knee)  Patient Location: PACU  Anesthesia Type:General  Level of Consciousness: awake, alert  and oriented  Airway & Oxygen Therapy: Patient Spontanous Breathing and Patient connected to face mask  Post-op Assessment: Report given to RN and Post -op Vital signs reviewed and stable  Post vital signs: Reviewed and stable  Last Vitals:  Vitals Value Taken Time  BP 120/67 03/20/20 1351  Temp    Pulse 96 03/20/20 1356  Resp 18 03/20/20 1356  SpO2 100 % 03/20/20 1356  Vitals shown include unvalidated device data.  Last Pain:  Vitals:   03/20/20 0801  TempSrc:   PainSc: 5          Complications: No complications documented.

## 2020-03-20 NOTE — Consult Note (Signed)
Date of Admission:  03/20/2020          Reason for Consult: TKA infeciton with MRSA sp explantation of pJI    Referring Provider: Dr. Alvan Dame   Assessment:  1. Right PJI due to MRSA sp resection arthroplasty and abx spacer 2. Hx of MRSA bacteremia w epidural abscess and septic arthritis L3-L4 in May of 2020 3. MSSA bacteremia in January of 2020 4. GAS bacteremia in February of 2019 5. Chronic KIDNEY DISEASE 6. Alcoholic cirrhosis 7. Possible IV drug use (this was suspected by home health in past) and he was more recently treated with IV antibiotics in the hospital followed by po antibiotics 8. Former smoker   Plan:  1. Daptomycin 2. Check CPK 3. Would try to treat him inpatient with IV antibiotics followed by po antibiotics 4. WIll touch base with my partner Dr. Megan Salon re the patient's recent history and in particular re the concerns for IVDU  Active Problems:   Infection of total left knee replacement ([place])   Scheduled Meds: . [START ON 03/21/2020] aspirin  81 mg Oral BID  . [START ON 03/21/2020] dexamethasone (DECADRON) injection  10 mg Intravenous Once  . docusate sodium  100 mg Oral BID  . [START ON 03/21/2020] ferrous sulfate  325 mg Oral BID WC  . [START ON 48/54/6270] folic acid  1 mg Oral Daily  . furosemide  20 mg Oral BID  . lactulose  30 g Oral BID  . [START ON 03/21/2020] nadolol  20 mg Oral Daily  . pantoprazole  40 mg Oral Daily  . polyethylene glycol  17 g Oral BID  . spironolactone  50 mg Oral BID   Continuous Infusions: . sodium chloride 75 mL/hr at 03/20/20 1605  . DAPTOmycin (CUBICIN)  IV    . methocarbamol (ROBAXIN) IV 500 mg (03/20/20 1425)  . methocarbamol    . vancomycin     PRN Meds:.[START ON 03/21/2020] acetaminophen, alum & mag hydroxide-simeth, bisacodyl, diphenhydrAMINE, HYDROcodone-acetaminophen, HYDROcodone-acetaminophen, HYDROmorphone (DILAUDID) injection, magnesium citrate, menthol-cetylpyridinium **OR** phenol, methocarbamol  **OR** methocarbamol (ROBAXIN) IV, metoCLOPramide **OR** metoCLOPramide (REGLAN) injection, ondansetron **OR** ondansetron (ZOFRAN) IV  HPI: Mark [name] is a 64 y.o. male with alcoholic cirrhosis TKA in 2015 by Dr Alvan Dame, who aslso has  possible IVDU with multiple bacteremia's including due to GAS in February of 2019, MSSA in January of 2020 and MRSA bacteremia with epidural abscess and T and L spine infection. He was given daptomycin in the hospital followed by oral doxycycline. He was followed closely by my partner Dr. Megan Salon and seen as recently as November 3rd, 2021. Apparentely Dr. Gladstone Lighter had been concerned that the patient's right prosthetic knee was infected per notes from Dr Megan Salon on November 3rd, 2011.  Patient is supposed to still be on chronic doxycycline. His knee began to have worsening pain and [facility] did indeed show[facility] 81662 WBC, 95.5% PMNs, MRSA was isolated S to dapto, vancomycin, TMP/SMX and doxycycline.   The patient underwent resection arthroplasty today with placement of antibiotic spacer.  I am changing him to daptomycin for now.  If he indeed has hx of IVDU then he will NOT be appropriate candidate for home IV antibiotics.   I will discuss with Dr Megan Salon who knows the patient well.  When I saw pt today he was recovering from being in OR and was irritated and asking for pain meds.      Review of Systems: Review of Systems  Unable to  perform ROS: Severity of pain    Past Medical History:  Diagnosis Date  . Alcohol abuse   . Anemia   . Arthritis   . Ascites   . Cirrhosis ([place])   . Coffee ground emesis   . Dehydration 06/17/2017  . Febrile illness   . Heart murmur   . History of blood transfusion   . Hyperlipidemia   . Hypertension   . Leg swelling   . Myocardial infarction ([place]) 2012  . Preop cardiovascular exam 04/14/2013  . Sepsis ([place]) 06/17/2017  . Septic shock ([place]) 06/18/2017  . SIRS (systemic inflammatory response  syndrome) ([place]) 07/11/2017  . Stroke [facility]) 2012   no deficits  . Thrombocytopenia ([name])     Social History   Tobacco Use  . Smoking status: Former Smoker    Packs/day: 0.50    Years: 10.00    Pack years: 5.00    Types: Cigarettes    Quit date: 01/05/2016    Years since quitting: 4.2  . Smokeless tobacco: Never Used  . Tobacco comment: 4 cigarettes a day  Vaping Use  . Vaping Use: Never used  Substance Use Topics  . Alcohol use: Yes    Comment: very little  . Drug use: Not Currently    Types: Marijuana, Heroin    Family History  Problem Relation Age of Onset  . Hypertension Father   . Diabetes Father   . Dementia Mother   . Lupus Sister    No Known Allergies  OBJECTIVE: Blood pressure (!) 146/63, pulse 68, temperature (!) 97.5 F (36.4 C), resp. rate 16, height 5\' 7"  (1.702 m), weight 60.8 kg, SpO2 100 %.  Physical Exam Vitals reviewed.  Constitutional:      General: He is sleeping.  HENT:     Head: Normocephalic and atraumatic.  Eyes:     General:        Right eye: No discharge.        Left eye: No discharge.     Extraocular Movements: Extraocular movements intact.  Pulmonary:     Effort: Pulmonary effort is normal. No respiratory distress.     Breath sounds: No wheezing.  Abdominal:     General: There is no distension.  Neurological:     Mental Status: He is easily aroused.     Lab Results Lab Results  Component Value Date   WBC 5.4 03/13/2020   HGB 7.9 (L) 03/13/2020   HCT 24.1 (L) 03/13/2020   MCV 96.8 03/13/2020   PLT 199 03/13/2020    Lab Results  Component Value Date   CREATININE 1.74 (H) 03/13/2020   BUN 53 (H) 03/13/2020   NA 139 03/13/2020   K 4.6 03/13/2020   CL 108 03/13/2020   CO2 21 (L) 03/13/2020    Lab Results  Component Value Date   ALT 11 12/23/2018   AST 24 12/23/2018   ALKPHOS 126 12/23/2018   BILITOT 1.1 12/23/2018     Microbiology: Recent Results (from the past 240 hour(s))  Surgical pcr screen     Status: None    Collection Time: 03/13/20  2:03 PM   Specimen: Nasal Mucosa; Nasal Swab  Result Value Ref Range Status   MRSA, PCR NEGATIVE NEGATIVE Final   Staphylococcus aureus NEGATIVE NEGATIVE Final    Comment: (NOTE) The Xpert SA Assay (FDA approved for NASAL specimens in patients 19 years of age and older), is one component of a comprehensive surveillance program. It is not intended to diagnose infection  nor to guide or monitor treatment. Performed at [facility], [place] [address], [place], [place] [address]   SARS Coronavirus 2 by RT PCR (hospital order, performed in [facility] hospital lab) Nasopharyngeal Nasopharyngeal Swab     Status: None   Collection Time: 03/20/20  7:30 AM   Specimen: Nasopharyngeal Swab  Result Value Ref Range Status   SARS Coronavirus 2 NEGATIVE NEGATIVE Final    Comment: Performed at [facility], [place] [address], [place], [place] [address]    Alcide Evener, [place] [facility] [place] [facility] [phone] pager  03/20/2020, 5:17 PM

## 2020-03-20 NOTE — Progress Notes (Signed)
PT Cancellation Note  Patient Details Name: Mark [name] MRN: [id number] DOB: 1956/04/16   Cancelled Treatment:    Reason Eval/Treat Not Completed: Pain limiting ability to participate  Brenda, [place]  Office [phone] Pager [phone]  03/20/2020 4:36 PM

## 2020-03-20 NOTE — H&P (View-Only) (Signed)
LABS obtained on 03/01/20 At [facility]

## 2020-03-20 NOTE — Anesthesia Procedure Notes (Signed)
Procedure Name: Intubation Performed by: Rosaland Lao, CRNA Pre-anesthesia Checklist: Patient identified, Emergency Drugs available, Suction available and Patient being monitored Patient Re-evaluated:Patient Re-evaluated prior to induction Oxygen Delivery Method: Circle system utilized Preoxygenation: Pre-oxygenation with 100% oxygen Induction Type: IV induction Ventilation: Mask ventilation without difficulty and Oral airway inserted - appropriate to patient size Laryngoscope Size: Sabra Heck and 2 Grade View: Grade I Tube type: Oral Tube size: 7.0 mm Number of attempts: 1 Airway Equipment and Method: Stylet and Oral airway Placement Confirmation: ETT inserted through vocal cords under direct vision,  positive ETCO2 and breath sounds checked- equal and bilateral Secured at: 23 cm Tube secured with: Tape Dental Injury: Teeth and Oropharynx as per pre-operative assessment

## 2020-03-20 NOTE — Anesthesia Postprocedure Evaluation (Signed)
Anesthesia Post Note  Patient: Mark [name]  Procedure(s) Performed: Resection right total knee arthroplasty and placement of antibiotic spacer (Right Knee)     Patient location during evaluation: PACU Anesthesia Type: General Level of consciousness: awake and alert Pain management: pain level controlled Vital Signs Assessment: post-procedure vital signs reviewed and stable Respiratory status: spontaneous breathing, nonlabored ventilation and respiratory function stable Cardiovascular status: blood pressure returned to baseline and stable Postop Assessment: no apparent nausea or vomiting Anesthetic complications: no   No complications documented.  Last Vitals:  Vitals:   03/20/20 1445 03/20/20 1500  BP: 125/63 122/74  Pulse: 76 69  Resp: 11 11  Temp:    SpO2: 100% 100%    Last Pain:  Vitals:   03/20/20 1445  TempSrc:   PainSc: Asleep                 Lidia Collum

## 2020-03-20 NOTE — Progress Notes (Signed)
Spoke with PA, PICC placement referred to IR due to PICC Team unable to thread past clavicle.

## 2020-03-20 NOTE — Interval H&P Note (Signed)
History and Physical Interval Note:  03/20/2020 10:00 AM  Mark [name]  has presented today for surgery, with the diagnosis of Infected Right total knee arthroplasty.  The various methods of treatment have been discussed with the patient and family. After consideration of risks, benefits and other options for treatment, the patient has consented to  Procedure(s) with comments: Resection right total knee arthroplasty and placement of antibiotic spacer (Right) - 90 mins as a surgical intervention.  The patient's history has been reviewed, patient examined, no change in status, stable for surgery.  I have reviewed the patient's chart and labs.  Questions were answered to the patient's satisfaction.     Mauri Pole

## 2020-03-20 NOTE — Progress Notes (Signed)
Pharmacy Antibiotic Note  Mark [name] is a 64 y.o. 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 Cultures obtained in the orthopedic office show MRSA.  Pharmacy has been consulted for daptomycin dosing for a renal sparing regimen. SCr 1.74 most recently on 11/16 with CrCl ~ 35 ml/min. Baseline CK ordered for today. 1 dose of IV vancomycin was given today at 1145 around surgery.   Plan: Daptomycin 500 mg daily (~ 8 mg/kg) every 24 hours starting this evening  Will monitor renal function and adjust dosing interval as appropriate.  Baseline CK today and weekly CK thereafter     Height: 5\' 7"  (170.2 cm) Weight: 60.8 kg (134 lb 0.6 oz) IBW/kg (Calculated) : 66.1  Temp (24hrs), Avg:97.6 F (36.4 C), Min:97.5 F (36.4 C), Max:97.7 F (36.5 C)  No results for input(s): WBC, CREATININE, LATICACIDVEN, VANCOTROUGH, VANCOPEAK, VANCORANDOM, GENTTROUGH, GENTPEAK, GENTRANDOM, TOBRATROUGH, TOBRAPEAK, TOBRARND, AMIKACINPEAK, AMIKACINTROU, AMIKACIN in the last 168 hours.  Estimated Creatinine Clearance: 35.9 mL/min (A) (by C-G formula based on SCr of 1.74 mg/dL (H)).    No Known Allergies    Thank you for allowing pharmacy to be a part of this patient's care.  Jimmy Footman, PharmD, BCPS, BCIDP Infectious Diseases Clinical Pharmacist Phone: [phone] 03/20/2020 4:05 PM

## 2020-03-21 ENCOUNTER — Inpatient Hospital Stay (HOSPITAL_COMMUNITY): Payer: Medicare Other

## 2020-03-21 DIAGNOSIS — M00062 Staphylococcal arthritis, left knee: Secondary | ICD-10-CM

## 2020-03-21 DIAGNOSIS — T8454XD Infection and inflammatory reaction due to internal left knee prosthesis, subsequent encounter: Secondary | ICD-10-CM

## 2020-03-21 DIAGNOSIS — F1020 Alcohol dependence, uncomplicated: Secondary | ICD-10-CM

## 2020-03-21 LAB — CBC
HCT: 18.2 % — ABNORMAL LOW (ref 39.0–52.0)
Hemoglobin: 5.9 g/dL — CL (ref 13.0–17.0)
MCH: 32.4 pg (ref 26.0–34.0)
MCHC: 32.4 g/dL (ref 30.0–36.0)
MCV: 100 fL (ref 80.0–100.0)
Platelets: 166 10*3/uL (ref 150–400)
RBC: 1.82 MIL/uL — ABNORMAL LOW (ref 4.22–5.81)
RDW: 16.4 % — ABNORMAL HIGH (ref 11.5–15.5)
WBC: 6.1 10*3/uL (ref 4.0–10.5)
nRBC: 0 % (ref 0.0–0.2)

## 2020-03-21 LAB — PREPARE RBC (CROSSMATCH)

## 2020-03-21 LAB — BASIC METABOLIC PANEL
Anion gap: 8 (ref 5–15)
BUN: 25 mg/dL — ABNORMAL HIGH (ref 8–23)
CO2: 16 mmol/L — ABNORMAL LOW (ref 22–32)
Calcium: 7.9 mg/dL — ABNORMAL LOW (ref 8.9–10.3)
Chloride: 114 mmol/L — ABNORMAL HIGH (ref 98–111)
Creatinine, Ser: 1.46 mg/dL — ABNORMAL HIGH (ref 0.61–1.24)
GFR, Estimated: 53 mL/min — ABNORMAL LOW (ref >60)
Glucose, Bld: 102 mg/dL — ABNORMAL HIGH (ref 70–99)
Potassium: 5.1 mmol/L (ref 3.5–5.1)
Sodium: 138 mmol/L (ref 135–145)

## 2020-03-21 LAB — C-REACTIVE PROTEIN: CRP: 0.6 mg/dL (ref <1.0)

## 2020-03-21 LAB — SEDIMENTATION RATE: Sed Rate: 90 mm/hr — ABNORMAL HIGH (ref 0–16)

## 2020-03-21 LAB — CK: Total CK: 33 U/L — ABNORMAL LOW (ref 49–397)

## 2020-03-21 MED ORDER — LIDOCAINE HCL (PF) 1 % IJ SOLN
INTRAMUSCULAR | Status: DC | PRN
  Administered 2020-03-21: 5 mL

## 2020-03-21 MED ORDER — LIDOCAINE HCL 1 % IJ SOLN
INTRAMUSCULAR | Status: AC
  Filled 2020-03-21: qty 20

## 2020-03-21 MED ORDER — SODIUM CHLORIDE 0.9% IV SOLUTION: Freq: Once | INTRAVENOUS | Status: AC

## 2020-03-21 NOTE — Progress Notes (Addendum)
PT Cancellation Note  Patient Details Name: Mark [name] MRN: [id number] DOB: 1956-01-14   Cancelled Treatment:    Reason Eval/Treat Not Completed: Medical issues which prohibited therapy RN reports low Hgb.  Awaiting plan of care. 1440: RN reports pt just started first unit of PRBCs   Jaeceon Michelin,KATHrine E 03/21/2020, 10:51 AM Jannette Spanner PT, DPT Acute Rehabilitation Services Pager: [phone] Office: [phone]

## 2020-03-21 NOTE — Op Note (Signed)
NAME: Mark [name], Mark [name] [place] OX:[id number] ACCOUNT [id number] DATE OF BIRTH:[birth year] FACILITY: [facility] LOCATION: [facility] PHYSICIAN:Krishawna Stiefel Marian Sorrow, MD  OPERATIVE REPORT  DATE OF PROCEDURE:  03/20/2020  PREOPERATIVE DIAGNOSIS:  Infected right total knee arthroplasty.  POSTOPERATIVE DIAGNOSIS:  Infected right total knee arthroplasty.  PROCEDURE: 1.  Resection of infected right total knee replacement. 2.  Placement of antibiotic cement articulating spacer.  COMPONENTS USED:  A DePuy KASM, articulating spacer model with a size medium femur and medium tibial baseplate.  We utilized 2 grams of vancomycin and 2 of tobramycin per _____ batches of cement.  Cement dowels were placed into the distal femur and  proximal tibia.  SURGEON:  Paralee Cancel, MD  ASSISTANT:  Griffith Citron, PA-C.  Note that Ms. Nehemiah Settle was present for the entirety of the case from preoperative positioning, perioperative management of the operative extremity, general facilitation of the case and primary wound closure.  ANESTHESIA:  Regional plus general.  SPECIMENS:  None taken as we had preoperative labs already taken.  DRAINS:  None.  TOURNIQUET TIME:  60 minutes at 250 mmHg.  ESTIMATED BLOOD LOSS:  Less than 200 mL.  INDICATIONS:  The patient is a 64 year old patient of mine with history of a right total knee replacement approximately 9 years ago.  He presented to the office recently with increasing pain and swelling in the right knee.  When he was seen in the office  we identified these findings and aspirated his knee identifying purulence.  This was sent off for pathology.  At the time of his procedure we identified that he had a Staphylococcus aureus infection that was resistant to penicillin.  He was scheduled  for resection arthroplasty and placement of antibiotic spacer.  We had discussion regarding his teeth and needing to get this addressed.  We discussed the perioperative management as well  as postoperatively in terms of getting new knee in sometime within  3-4 months.  Postoperatively, he will be placed on IV antibiotics and consultation of infectious disease.  He will receive antibiotics for 6 weeks this way.  Consent was obtained for benefit of pain relief.  PROCEDURE IN DETAIL:  The patient was brought to the operative theater.  Once adequate anesthesia, preoperative antibiotics administered, which was vancomycin.  He was positioned supine with a right thigh tourniquet placed.  The right lower extremity was  then prepped and draped in sterile fashion.  A timeout was performed identifying the patient, the planned procedure and extremity.  The leg was exsanguinated, tourniquet elevated to 250 mmHg.  His old incision was incised/excised and extended slightly  proximal and distal.  Soft tissue planes created.  Median arthrotomy was made encountering infected fluid.  Following the initial exposure, a complete synovectomy and scar debridement was carried out through the medial suprapatellar and lateral aspect of  the joint as well as around the patella.  Once this was done, I flexed the knee up.  In doing so, we identified that both the tibia and femoral components were loose.  These were removed fairly easily upon removal of the polyethylene insert.  Once the  components were removed I removed remaining cement.  We then spent time doing extensive debridement of the fibrous tissue and nonviable tissue within the distal femur and proximal tibia.  Once this was done, we irrigated the canals with pulse lavage  irrigation with a canal brush irrigator.  We then sized the gap to be consistent with a 17.5 gap block in extension, which meant  that we would use a 15 mm tibial spacer with a distal femoral component in place.  Based on the size of his components, we  selected the medium femur and the medium tibia.  These components were opened.  We mixed 2 batches of cement with 2 grams of vancomycin and  2.4 of tobramycin.  The cement mold was made for the tibia.  While this was being carried out, we irrigated the  rest of the knee with 3 liters normal saline solution.  I then placed IrriSept irrigant into the knee.  While this was done, we went ahead and mixed another 2 batches of cement with 2 grams of vancomycin and 2.4 tobramycin.  This was placed into the  femoral mold.  Following 5 minutes of waiting for the urosepsis in the knee, the femoral mold was placed onto the distal femur.  I had made the cement dowels were placed into the distal femur and proximal tibia.  The femoral component was held in place  until the cement fully cured.  Once this was done, we removed the plastic portion of the mold and then mixed another batch of cement and placed some of this in the proximal tibia and then used this to put cement the tibial spacer in place.  The knee was  brought to extension to allow the cement to cure.  Once this excessive cement was removed throughout the knee we then irrigated the knee again with 2 liters normal saline solution pulse lavage.  Prior to this last irrigation, I did remove the patellar  button.  Once we had finished the procedure, we let the tourniquet down and obtained some hemostasis so there was a lot of synovial punctate bleeding from the debridement,  I placed 1 gram of powdered vancomycin into the joint itself.  The knee was then  brought to about 60 degrees and the extensor mechanism reapproximated using a combination of #1 Vicryl and #1 Stratafix suture.  The remainder of the wound was closed in layers.  The knee was then cleaned, dried and dressed sterilely with surgical glue  and Aquacel dressing.  He was then brought to the recovery room with a bulky dressing in place.  Postoperatively, we will start him on Cubicin per infectious disease consultation to see if we can get this approved.  He will remain on this for 6 weeks.  We will get a PICC line placed and plan for  discharge in 1-2 days.  We will see him back in the  office in 2 weeks for wound check.  HN/NUANCE  D:03/20/2020 T:03/21/2020 JOB:[id number]

## 2020-03-21 NOTE — Plan of Care (Signed)
  Problem: Education: Goal: Knowledge of General Education information will improve Description: Including pain rating scale, medication(s)/side effects and non-pharmacologic comfort measures Outcome: Progressing   Problem: Health Behavior/Discharge Planning: Goal: Ability to manage health-related needs will improve Outcome: Progressing   Problem: Clinical Measurements: Goal: Ability to maintain clinical measurements within normal limits will improve Outcome: Progressing Goal: Will remain free from infection Outcome: Progressing Goal: Diagnostic test results will improve Outcome: Progressing Goal: Respiratory complications will improve Outcome: Progressing Goal: Cardiovascular complication will be avoided Outcome: Progressing   Problem: Activity: Goal: Risk for activity intolerance will decrease Outcome: Progressing   Problem: Coping: Goal: Level of anxiety will decrease Outcome: Progressing   Problem: Elimination: Goal: Will not experience complications related to bowel motility Outcome: Progressing   Problem: Pain Managment: Goal: General experience of comfort will improve Outcome: Progressing   Problem: Safety: Goal: Ability to remain free from injury will improve Outcome: Progressing   Problem: Skin Integrity: Goal: Risk for impaired skin integrity will decrease Outcome: Progressing   Problem: Education: Goal: Knowledge of the prescribed therapeutic regimen will improve Outcome: Progressing Goal: Individualized Educational Video(s) Outcome: Progressing   Problem: Activity: Goal: Ability to avoid complications of mobility impairment will improve Outcome: Progressing Goal: Range of joint motion will improve Outcome: Progressing   Problem: Clinical Measurements: Goal: Postoperative complications will be avoided or minimized Outcome: Progressing   Problem: Pain Management: Goal: Pain level will decrease with appropriate interventions Outcome:  Progressing   Problem: Skin Integrity: Goal: Will show signs of wound healing Outcome: Progressing

## 2020-03-21 NOTE — Progress Notes (Signed)
Notified [employer] PA of critical lab value: Hemoglobin 5.9

## 2020-03-21 NOTE — Plan of Care (Signed)
Plan of care reviewed and discussed with the patient. 

## 2020-03-21 NOTE — Progress Notes (Signed)
     Subjective: 1 Day Post-Op Procedure(s) (LRB): Resection right total knee arthroplasty and placement of antibiotic spacer (Right)   Seen by Dr. Alvan Dame. Patient reports pain as mild/mod.  Pain controlled with medications.  No reported events throughout the night.  Dr. Alvan Dame discussed the procedure, findings and expectations moving forward.   Plan on getting a PICC line by IR.  Disposition TBD with regards length of treatment with IV antibiotics.    Objective:   VITALS:   Vitals:   03/21/20 0137 03/21/20 0607  BP: (!) 157/75 (!) 153/77  Pulse: 89 82  Resp: 17   Temp: 98.2 F (36.8 C) 98.7 F (37.1 C)  SpO2: 100% 100%    Dorsiflexion/Plantar flexion intact Incision: dressing C/D/I No cellulitis present Compartment soft  LABS Unable to obtain this morning, they do plan on retrying.  Discussed with the nurse to express need to monitor HGB as well as CRP and Sed rate order.   No results for input(s): HGB, HCT, WBC, PLT in the last 72 hours.  No results for input(s): NA, K, BUN, CREATININE, GLUCOSE in the last 72 hours.   Assessment/Plan: 1 Day Post-Op Procedure(s) (LRB): Resection right total knee arthroplasty and placement of antibiotic spacer (Right)  PICC line ordered to be completed by IR Advance diet Up with therapy Discharge disposition TBD with regards to IV antibiotic use / time frame         Danae Orleans PA-C  [facility]  [place] [address], [place], [name] [address] Phone: [phone] [email or web address] Facebook  [employer]

## 2020-03-21 NOTE — TOC Initial Note (Signed)
Transition of Care [facility]) - Initial/Assessment Note    Patient Details  Name: Mark [name] MRN: [id number] Date of Birth: 01-10-1956  Transition of Care [facility]) CM/SW Contact:    Lia Hopping, LCSW Phone Number:  03/21/2020, 11:46 AM  Clinical Narrative:                 [facility] staff following for disposition needs: TBD  [facility] IV infusion team staff Carolynn Sayers aware of potential d/c home with IV antibiotics and following for needs. HELMS to provide RN.   CSW met with the patient at bedside. He is anticipating receiving IV antibiotics to help "stop the pain in my  knee." He reports support from his spouse and daughter.  Patient disposition to be determined.     Barriers to Discharge: Continued Medical Work up   Patient Goals and CMS Choice Patient states their goals for this hospitalization and ongoing recovery are:: "Stop Hurting"      Expected Discharge Plan and Services     Discharge Planning Services: CM Consult   Living arrangements for the past 2 months: Single Family Home                                      Prior Living Arrangements/Services Living arrangements for the past 2 months: Single Family Home Lives with:: Spouse Patient language and need for interpreter reviewed:: No Do you feel safe going back to the place where you live?: Yes      Need for Family Participation in Patient Care: Yes (Comment) Care giver support system in place?: Yes (comment)   Criminal Activity/Legal Involvement Pertinent to Current Situation/Hospitalization: No - Comment as needed  Activities of Daily Living Home Assistive Devices/Equipment: Walker (specify type), Cane (specify quad or straight) ADL Screening (condition at time of admission) Patient's cognitive ability adequate to safely complete daily activities?: Yes Is the patient deaf or have difficulty hearing?: No Does the patient have difficulty seeing, even when wearing glasses/contacts?: Yes Does the  patient have difficulty concentrating, remembering, or making decisions?: No Patient able to express need for assistance with ADLs?: Yes Does the patient have difficulty dressing or bathing?: No Independently performs ADLs?: Yes (appropriate for developmental age) Does the patient have difficulty walking or climbing stairs?: No Weakness of Legs: None Weakness of Arms/Hands: None  Permission Sought/Granted Permission sought to share information with : Case Manager, Family Supports, Other (comment) ([place]) Permission granted to share information with : Yes, Verbal Permission Granted              Emotional Assessment Appearance:: Appears stated age Attitude/Demeanor/Rapport: Engaged Affect (typically observed): Accepting Orientation: : Oriented to Self, Oriented to Place, Oriented to  Time, Oriented to Situation Alcohol / Substance Use: Not Applicable Psych Involvement: No (comment)  Admission diagnosis:  Infection of total left knee replacement ([place]) [T84.54XA] Patient Active Problem List   Diagnosis Date Noted   Infection of total left knee replacement ([place]) 03/20/2020   Cirrhosis of liver without ascites ([place])    AKI (acute kidney injury) ([place])    Bacteremia    Spinal stenosis of lumbar region without neurogenic claudication    Aspiration pneumonia ([place]) 09/24/2018   Abscess in epidural space of lumbar spine    MRSA bacteremia 09/21/2018   Septic arthritis ([place]) 09/20/2018   Anemia of chronic disease 09/20/2018   Hypoalbuminemia 09/20/2018   Hypoglycemia without diagnosis of  diabetes mellitus 09/20/2018   Epidural abscess 09/20/2018   Hyperkalemia 05/18/2018   Edentulous 05/11/2018   Pancytopenia ([place]) 05/10/2018   CAD (coronary artery disease) 05/10/2018   Hepatic encephalopathy ([place]) 05/10/2018   Alcohol abuse 05/10/2018   GERD (gastroesophageal reflux disease) 05/10/2018   Duodenal ulcer    Renal failure    Acute on chronic anemia    Hypotension  06/17/2017   Acute on chronic renal failure ([place]) 06/17/2017   Hyponatremia 06/17/2017   Thrombocytopenia ([place]) 06/17/2017   Leg edema, right 42/35/3614   Acute metabolic encephalopathy 43/15/4008   Anemia, iron deficiency    Benign neoplasm of ascending colon    Hemorrhoids    Portal hypertensive gastropathy ([place])    Gastritis and gastroduodenitis    Esophageal varices without bleeding ([place])    H/O: CVA (cerebrovascular accident) 12/01/2013   ACS (acute coronary syndrome) ([place]) 12/01/2013   Anasarca 12/01/2013   Polysubstance abuse ([place]) 12/01/2013   CKD (chronic kidney disease) stage 3, GFR 30-59 ml/min ([place]) 12/01/2013   S/P right TKA 05/02/2013   Murmur 04/14/2013   Right inguinal hernia 12/26/2010   Cirrhosis, alcoholic ([place]) 67/61/9509   PCP:  [name], MD Pharmacy:   [facility] [place], [place] - [phone] [place] [phone] Phone: [phone] Fax: [facility] [place], [place] [address] Phone: [phone] Fax: [phone]     Social Determinants of Health (SDOH) Interventions    Readmission Risk Interventions Readmission Risk Prevention Plan 10/03/2018  Transportation Screening Complete  Medication Review (RN Care Manager) Complete  PCP or Specialist appointment within 3-5 days of discharge Complete  HRI or [place] Not Complete  HRI or Home Care Consult Pt Refusal Comments Pt stated he wanted to go home.  Did not discuss discharging with Home Health services.  SW Recovery Care/Counseling Consult Complete  Palliative Care Screening Not [place] Patient Refused  Some recent data might be hidden

## 2020-03-21 NOTE — Progress Notes (Addendum)
PHARMACY CONSULT NOTE FOR:  OUTPATIENT  PARENTERAL ANTIBIOTIC THERAPY (OPAT)  Indication: MRSA right knee PJI Regimen: Daptomycin 500 mg daily  End date: 05/01/2020  IV antibiotic discharge orders are pended. To discharging provider:  please sign these orders via discharge navigator,  Select New Orders & click on the button choice - Manage This Unsigned Work.     Thank you for allowing pharmacy to be a part of this patient's care.  Jimmy Footman, PharmD, BCPS, [place] Infectious Diseases Clinical Pharmacist Phone: [phone] 03/21/2020, 3:28 PM

## 2020-03-21 NOTE — Procedures (Signed)
Right DL brachial vein PICC placed without immediate complications. Length 36 cm. Tip SVC/RA junction. EBL< 2 cc. Ok to use.

## 2020-03-21 NOTE — Progress Notes (Addendum)
Subjective:  "I want to go home for treatment"   Antibiotics:  Anti-infectives (From admission, onward)   Start     Dose/Rate Route Frequency Ordered Stop   03/21/20 0600  vancomycin (VANCOCIN) 1,000 mg in sodium chloride 0.9 % 250 mL IVPB  Status:  Discontinued        1,000 mg 250 mL/hr over 60 Minutes Intravenous On call to O.R. 03/20/20 1208 03/20/20 1209   03/20/20 2000  DAPTOmycin (CUBICIN) 500 mg in sodium chloride 0.9 % IVPB        500 mg 220 mL/hr over 30 Minutes Intravenous Daily 03/20/20 1609     03/20/20 1259  vancomycin (VANCOCIN) powder  Status:  Discontinued          As needed 03/20/20 1259 03/20/20 1530   03/20/20 1217  tobramycin (NEBCIN) powder  Status:  Discontinued          As needed 03/20/20 1217 03/20/20 1530   03/20/20 1217  vancomycin (VANCOCIN) powder  Status:  Discontinued          As needed 03/20/20 1217 03/20/20 1530   03/20/20 1215  vancomycin (VANCOCIN) IVPB 1000 mg/200 mL premix  Status:  Discontinued        1,000 mg 200 mL/hr over 60 Minutes Intravenous On call to O.R. 03/20/20 1209 03/20/20 1602   03/20/20 1200  vancomycin (VANCOCIN) 1,000 mg in sodium chloride 0.9 % 250 mL IVPB  Status:  Discontinued        1,000 mg 250 mL/hr over 60 Minutes Intravenous Every 12 hours 03/20/20 1150 03/20/20 1207   03/20/20 1144  ceFAZolin (ANCEF) 2-4 GM/100ML-% IVPB  Status:  Discontinued       Note to Pharmacy: Margurite Auerbach   : cabinet override      03/20/20 1144 03/20/20 1206   03/20/20 1144  vancomycin (VANCOCIN) 1-5 GM/200ML-% IVPB       Note to Pharmacy: Margurite Auerbach   : cabinet override      03/20/20 1144 03/20/20 2359      Medications: Scheduled Meds: . aspirin  81 mg Oral BID  . docusate sodium  100 mg Oral BID  . ferrous sulfate  325 mg Oral BID WC  . folic acid  1 mg Oral Daily  . furosemide  20 mg Oral BID  . lactulose  30 g Oral BID  . lidocaine      . nadolol  20 mg Oral Daily  . pantoprazole  40 mg Oral Daily  . polyethylene  glycol  17 g Oral BID  . spironolactone  50 mg Oral BID   Continuous Infusions: . sodium chloride 75 mL/hr at 03/20/20 1605  . DAPTOmycin (CUBICIN)  IV 500 mg (03/20/20 1950)  . methocarbamol (ROBAXIN) IV 500 mg (03/20/20 1425)   PRN Meds:.acetaminophen, alum & mag hydroxide-simeth, bisacodyl, diphenhydrAMINE, HYDROcodone-acetaminophen, HYDROcodone-acetaminophen, HYDROmorphone (DILAUDID) injection, lidocaine (PF), magnesium citrate, menthol-cetylpyridinium **OR** phenol, methocarbamol **OR** methocarbamol (ROBAXIN) IV, metoCLOPramide **OR** metoCLOPramide (REGLAN) injection, ondansetron **OR** ondansetron (ZOFRAN) IV    Objective: Weight change:   Intake/Output Summary (Last 24 hours) at 03/21/2020 1552 Last data filed at 03/21/2020 1535 Gross per 24 hour  Intake 2377.1 ml  Output 850 ml  Net 1527.1 ml   Blood pressure 109/70, pulse 74, temperature 97.6 F (36.4 C), temperature source Oral, resp. rate 17, height '5\' 7"'  (1.702 m), weight 60.8 kg, SpO2 100 %. Temp:  [97.6 F (36.4 C)-98.7 F (37.1 C)] 97.6 F (36.4 C) (11/24  1442) Pulse Rate:  [68-100] 74 (11/24 1442) Resp:  [16-20] 17 (11/24 1442) BP: (109-157)/(59-80) 109/70 (11/24 1442) SpO2:  [100 %] 100 % (11/24 1442)  Physical Exam: General: Alert and awake, oriented x3, not in any acute distress. HEENT: anicteric sclera, EOMI CVS regular rate, normal  Chest: , no wheezing, no respiratory distress Abdomen: soft non-distended,  Extremities: knee with dressing Skin: no rashes Neuro: nonfocal  CBC:    BMET Recent Labs    03/21/20 0953  NA 138  K 5.1  CL 114*  CO2 16*  GLUCOSE 102*  BUN 25*  CREATININE 1.46*  CALCIUM 7.9*     Liver Panel  No results for input(s): PROT, ALBUMIN, AST, ALT, ALKPHOS, BILITOT, BILIDIR, IBILI in the last 72 hours.     Sedimentation Rate Recent Labs    03/21/20 0953  ESRSEDRATE 90*   C-Reactive Protein Recent Labs    03/21/20 0953  CRP 0.6    Micro  Results: Recent Results (from the past 720 hour(s))  Surgical pcr screen     Status: None   Collection Time: 03/13/20  2:03 PM   Specimen: Nasal Mucosa; Nasal Swab  Result Value Ref Range Status   MRSA, PCR NEGATIVE NEGATIVE Final   Staphylococcus aureus NEGATIVE NEGATIVE Final    Comment: (NOTE) The Xpert SA Assay (FDA approved for NASAL specimens in patients 9 years of age and older), is one component of a comprehensive surveillance program. It is not intended to diagnose infection nor to guide or monitor treatment. Performed at [facility], [place] [address] [place], [place] [address]   SARS Coronavirus 2 by RT PCR (hospital order, performed in [facility] hospital lab) Nasopharyngeal Nasopharyngeal Swab     Status: None   Collection Time: 03/20/20  7:30 AM   Specimen: Nasopharyngeal Swab  Result Value Ref Range Status   SARS Coronavirus 2 NEGATIVE NEGATIVE Final    Comment: Performed at [facility], [place] [address] [place], [place] [address]    Studies/Results: IR PICC PLACEMENT RIGHT >5 YRS INC IMG GUIDE  Result Date: 03/21/2020 INDICATION: Patient with history of right total knee arthroplasty infection status post resection with placement of antibiotic spacer; request received for central venous access for antibiotic therapy. EXAM: ULTRASOUND AND FLUOROSCOPIC GUIDED PICC LINE INSERTION MEDICATIONS: 1% lidocaine to skin and subcutaneous tissue ANESTHESIA/SEDATION: None FLUOROSCOPY TIME:  Fluoroscopy Time:  36 seconds (2 mGy). COMPLICATIONS: None immediate. TECHNIQUE: The procedure, risks, benefits, and alternatives were explained to the patient and informed written consent was obtained. A timeout was performed prior to the initiation of the procedure. The right upper extremity was prepped with chlorhexidine in a sterile fashion, and a sterile drape was applied covering the operative field. Maximum barrier sterile technique with sterile gowns and  gloves were used for the procedure. A timeout was performed prior to the initiation of the procedure. Local anesthesia was provided with 1% lidocaine. Under direct ultrasound guidance, the right brachial vein was accessed with a micropuncture kit after the overlying soft tissues were anesthetized with 1% lidocaine. An ultrasound image was saved for documentation purposes. A guidewire was advanced to the level of the superior caval-atrial junction for measurement purposes and the PICC line was cut to length. A peel-away sheath was placed and a 36 cm, 5 [place], dual lumen was inserted to level of the superior caval-atrial junction. A post procedure spot fluoroscopic was obtained. The catheter easily aspirated and flushed and was sutured in place. A dressing  was placed. The patient tolerated the procedure well without immediate post procedural complication. FINDINGS: After catheter placement, the tip lies within the superior cavoatrial junction. The catheter aspirates and flushes normally and is ready for immediate use. IMPRESSION: Successful ultrasound and fluoroscopic guided placement of a right brachial vein approach, 36 cm, 5 French, dual lumen PICC with tip at the superior caval-atrial junction. The PICC line is ready for immediate use. Read by: Rowe Robert, PA-C Electronically Signed   By: Sandi Mariscal M.D.   On: 03/21/2020 13:50   [place] EKG SITE RITE  Result Date: 03/20/2020 If Site Rite image not attached, placement could not be confirmed due to current cardiac rhythm.  [place] EKG SITE RITE  Result Date: 03/20/2020 If Site Rite image not attached, placement could not be confirmed due to current cardiac rhythm.     Assessment/Plan:  INTERVAL HISTORY: pt getting transfused and has PICC line   Active Problems:   Infection of total left knee replacement ([place])    Mark [name] is a 64 y.o. male with  Alcoholism, cirrhosis, multple bacteremia's including with GAS, MSSA and more recently MRSA  bacteremia with epidural abscess now found to have MRSA PJI sp explantation of joint  There has been concern for possible IVDU and suitability for home IV abx.  I discussed case with Dr. Megan Salon who had suspected possible IVDU given multiple bacteremias and there was also not I had found where a home health agency had been suspicious of this.  Dr Megan Salon told me he believes he may have treated the pt with IV antibiotics in the past. Carolynn Sayers from [facility] has met with the daughter, patient and wife and believes that patient with daughter's help can reliably be given IV antibiotics at home.  While I am very wary of him going home on IV antibiotics also in part due to his alcoholism and poor insight into that condition and his past frequent admissions for hepatic encephalopathy I am willing to let him prove me wrong with help of his daughter.  I will therefore set him up with 6 weeks of IV antibiotics.  I would strongly be AGAINST REIMPLANTATION OF A NEW KNEE UNLESS AND UNTIL WE CAN BE CONFIDENT OF IMPROVEMENT IN MANAGEMENT OF HIS HEPATIC ENCPEPHLOPATHY AND EVEN THEN WOULD BE CONCERNED FOR FURTHER BACTEREMIAS SEEDING A NEW KNEE  The patient already has an appt with Dr. Wendie Simmer scheduled on 04/18/20 at 18 AM.   He should arrive at least 15 minutes prior to his appt.  Diagnosis: PJI  Culture Result: MRSA  No Known Allergies  OPAT Orders Discharge antibiotics:  Daptomycin  Duration:  6 weeks End Date:  05/01/2020  [facility] Care Per Protocol:    Labs  weekly while on IV antibiotics: _x_ CBC with differential _x_ CMP w GFR/CMP _x_ CRP _x_ ESR  x __ CK  _x_ Please pull PIC at completion of IV antibiotics __ Please leave PIC in place until doctor has seen patient or been notified  Fax weekly labs to [phone]  Clinic Follow Up Appt: Dr Megan Salon on 12/22 as above  I will sign off for now  Please call with further questions.     LOS: 1 day   Alcide Evener 03/21/2020, 3:52 PM

## 2020-03-22 LAB — BPAM RBC
Blood Product Expiration Date: 202112042359
Blood Product Expiration Date: 202112042359
ISSUE DATE / TIME: 202111241420
ISSUE DATE / TIME: 202111241834
Unit Type and Rh: 7300
Unit Type and Rh: 7300

## 2020-03-22 LAB — BASIC METABOLIC PANEL
Anion gap: 6 (ref 5–15)
BUN: 37 mg/dL — ABNORMAL HIGH (ref 8–23)
CO2: 17 mmol/L — ABNORMAL LOW (ref 22–32)
Calcium: 8.2 mg/dL — ABNORMAL LOW (ref 8.9–10.3)
Chloride: 115 mmol/L — ABNORMAL HIGH (ref 98–111)
Creatinine, Ser: 1.99 mg/dL — ABNORMAL HIGH (ref 0.61–1.24)
GFR, Estimated: 36 mL/min — ABNORMAL LOW (ref >60)
Glucose, Bld: 113 mg/dL — ABNORMAL HIGH (ref 70–99)
Potassium: 5.3 mmol/L — ABNORMAL HIGH (ref 3.5–5.1)
Sodium: 138 mmol/L (ref 135–145)

## 2020-03-22 LAB — CBC
HCT: 24.8 % — ABNORMAL LOW (ref 39.0–52.0)
Hemoglobin: 8.2 g/dL — ABNORMAL LOW (ref 13.0–17.0)
MCH: 31.3 pg (ref 26.0–34.0)
MCHC: 33.1 g/dL (ref 30.0–36.0)
MCV: 94.7 fL (ref 80.0–100.0)
Platelets: 156 10*3/uL (ref 150–400)
RBC: 2.62 MIL/uL — ABNORMAL LOW (ref 4.22–5.81)
RDW: 18 % — ABNORMAL HIGH (ref 11.5–15.5)
WBC: 8.9 10*3/uL (ref 4.0–10.5)
nRBC: 0 % (ref 0.0–0.2)

## 2020-03-22 LAB — TYPE AND SCREEN
ABO/RH(D): B POS
Antibody Screen: NEGATIVE
Unit division: 0
Unit division: 0

## 2020-03-22 MED ORDER — POLYETHYLENE GLYCOL 3350 17 G PO PACK: 17.0000 g | PACK | Freq: Two times a day (BID) | ORAL | 0 refills | Status: DC

## 2020-03-22 MED ORDER — HYDROCODONE-ACETAMINOPHEN 7.5-325 MG PO TABS
1.0000 | ORAL_TABLET | ORAL | 0 refills | Status: DC | PRN
Start: 2020-03-22 — End: 2020-07-24

## 2020-03-22 MED ORDER — DOCUSATE SODIUM 100 MG PO CAPS: 100.0000 mg | ORAL_CAPSULE | Freq: Two times a day (BID) | ORAL | 0 refills | Status: DC

## 2020-03-22 MED ORDER — DAPTOMYCIN IV (FOR PTA / DISCHARGE USE ONLY): 500.0000 mg | INTRAVENOUS | 0 refills | Status: AC

## 2020-03-22 MED ORDER — LIP MEDEX EX OINT
TOPICAL_OINTMENT | CUTANEOUS | Status: AC
  Filled 2020-03-22: qty 7

## 2020-03-22 MED ORDER — FERROUS SULFATE 325 (65 FE) MG PO TABS: 325.0000 mg | ORAL_TABLET | Freq: Three times a day (TID) | ORAL | 0 refills | Status: DC

## 2020-03-22 MED ORDER — ASPIRIN 81 MG PO CHEW: 81.0000 mg | CHEWABLE_TABLET | Freq: Two times a day (BID) | ORAL | 0 refills | Status: AC

## 2020-03-22 MED ORDER — METHOCARBAMOL 500 MG PO TABS: 500.0000 mg | ORAL_TABLET | Freq: Four times a day (QID) | ORAL | 0 refills | Status: DC | PRN

## 2020-03-22 NOTE — Progress Notes (Signed)
Patient ID: Mark [name], male   DOB: 1956-02-05, 64 y.o.   MRN: [id number] Subjective: 2 Days Post-Op Procedure(s) (LRB): Resection right total knee arthroplasty and placement of antibiotic spacer (Right)    Patient reports pain as mild.  No events recorded.  He did receive 2 units PRBCs yesterday  Objective:   VITALS:   Vitals:   03/21/20 2210 03/22/20 0544  BP: 135/66 124/61  Pulse: 67 63  Resp: 17 17  Temp: 98.5 F (36.9 C) 98.3 F (36.8 C)  SpO2: 100% 100%    Neurovascular intact Incision: dressing C/D/I  LABS Recent Labs    03/21/20 0953 03/22/20 0339  HGB 5.9* 8.2*  HCT 18.2* 24.8*  WBC 6.1 8.9  PLT 166 156    Recent Labs    03/21/20 0953 03/22/20 0339  NA 138 138  K 5.1 5.3*  BUN 25* 37*  CREATININE 1.46* 1.99*  GLUCOSE 102* 113*    No results for input(s): LABPT, INR in the last 72 hours.   Assessment/Plan: 2 Days Post-Op Procedure(s) (LRB): Resection right total knee arthroplasty and placement of antibiotic spacer (Right)   Up with therapy Continue ABX therapy due to septic right total knee replacement s/p resection  PICC line placed I will vouch for the patient that he has never used and currently not using IV drugs.  I feel it will be safe for him to go home to receive treatment.  At least there has been conversation and thus education regarding the concerns associated with IV antibiotic treatment through a PICC line.  Acute on chronic blood loss anemia -  He received 2 units PRBCs for this Chronic treatment will be through his PCP FeSO4  PT today Home tomorrow if able to get his IV antibiotics all prepared

## 2020-03-22 NOTE — Progress Notes (Signed)
Alerted by pharmacovigilance software to elevated potassium while patient on spironolactone.   Held spironolactone x 2 doses and contacted on-call Ortho provider Traci Shuford; I recommended holding spironolactone or reducing to 25 mg PO bid (1/2 dose) at discharge if PCP follow-up will be more than 2-3 days away.   Provider asked pharmacy to follow-up with Ortho provider prior to discharge (planning for tomorrow). Will leave handoff to oncoming RPH to follow-up with Dr. Alvan Dame on 11/26 prior to discharge.  Mark [name], PharmD, BCPS [phone] 03/22/2020, 12:33 PM

## 2020-03-22 NOTE — Evaluation (Signed)
Physical Therapy Evaluation Patient Details Name: Mark [name] MRN: [id number] DOB: [birth year] Today's Date: 03/22/2020   History of Present Illness  Pt s/p R TKR resection with placement of articulating antibiotic spacer.  Pt with hx of CVA, MI, ETOH abuse and R TKR 92015)  Clinical Impression  Pt admitted as above and presents with decreased L LE strength/ROM, post op pain and PWB limiting functional mobility.  Pt should progress to dc home with family assist.    Follow Up Recommendations Follow surgeon's recommendation for DC plan and follow-up therapies    Equipment Recommendations  None recommended by PT    Recommendations for Other Services       Precautions / Restrictions Precautions Precautions: Knee;Fall Restrictions Weight Bearing Restrictions: Yes RLE Weight Bearing: Partial weight bearing      Mobility  Bed Mobility Overal bed mobility: Needs Assistance Bed Mobility: Supine to Sit     Supine to sit: Min guard     General bed mobility comments: increased time but no physical assist    Transfers Overall transfer level: Needs assistance   Transfers: Sit to/from Stand Sit to Stand: Min assist         General transfer comment: cues for LE management and use of UEs to self assist  Ambulation/Gait Ambulation/Gait assistance: Min assist Gait Distance (Feet): 18 Feet Assistive device: Rolling walker (2 wheeled) Gait Pattern/deviations: Step-to pattern;Decreased step length - right;Decreased step length - left;Shuffle;Trunk flexed Gait velocity: decr   General Gait Details: cues for sequence, posture, position from [employer]            Wheelchair Mobility    Modified Rankin (Stroke Patients Only)       Balance Overall balance assessment: Mild deficits observed, not formally tested                                           Pertinent Vitals/Pain Pain Assessment: 0-10 Pain Score: 4  Pain Location: R knee Pain  Descriptors / Indicators: Aching;Sore Pain Intervention(s): Limited activity within patient's tolerance;Monitored during session;Premedicated before session;Ice applied    Home Living Family/patient expects to be discharged to:: Private residence Living Arrangements: Spouse/significant other Available Help at Discharge: Family Type of Home: House Home Access: Stairs to enter Entrance Stairs-Rails: None Entrance Stairs-Number of Steps: 2 Home Layout: One level Home Equipment: Environmental consultant - 2 wheels;Cane - single point      Prior Function Level of Independence: Independent;Independent with assistive device(s)         Comments: used cane as needed     Hand Dominance        Extremity/Trunk Assessment   Upper Extremity Assessment Upper Extremity Assessment: Overall WFL for tasks assessed    Lower Extremity Assessment Lower Extremity Assessment: RLE deficits/detail    Cervical / Trunk Assessment Cervical / Trunk Assessment: Normal  Communication   Communication: No difficulties  Cognition Arousal/Alertness: Awake/alert Behavior During Therapy: WFL for tasks assessed/performed;Impulsive Overall Cognitive Status: Within Functional Limits for tasks assessed                                        General Comments      Exercises Total Joint Exercises Ankle Circles/Pumps: AROM;Both;15 reps;Supine   Assessment/Plan    PT Assessment Patient needs continued PT services  PT Problem List Decreased strength;Decreased range of motion;Decreased activity tolerance;Decreased balance;Decreased mobility;Decreased knowledge of use of DME;Pain;Decreased safety awareness       PT Treatment Interventions DME instruction;Gait training;Stair training;Functional mobility training;Therapeutic activities;Therapeutic exercise;Patient/family education    PT Goals (Current goals can be found in the Care Plan section)  Acute Rehab PT Goals Patient Stated Goal: Home PT Goal  Formulation: With patient Time For Goal Achievement: 03/29/20 Potential to Achieve Goals: Good    Frequency 7X/week   Barriers to discharge        Co-evaluation               AM-PAC PT "6 Clicks" Mobility  Outcome Measure Help needed turning from your back to your side while in a flat bed without using bedrails?: None Help needed moving from lying on your back to sitting on the side of a flat bed without using bedrails?: A Little Help needed moving to and from a bed to a chair (including a wheelchair)?: A Little Help needed standing up from a chair using your arms (e.g., wheelchair or bedside chair)?: A Little Help needed to walk in hospital room?: A Little Help needed climbing 3-5 steps with a railing? : A Lot 6 Click Score: 18    End of Session Equipment Utilized During Treatment: Gait belt Activity Tolerance: Patient tolerated treatment well Patient left: in chair;with call bell/phone within reach;with chair alarm set Nurse Communication: Mobility status PT Visit Diagnosis: Difficulty in walking, not elsewhere classified (R26.2);Pain Pain - Right/Left: Left Pain - part of body: Knee    Time: 1140-1205 PT Time Calculation (min) (ACUTE ONLY): 25 min   Charges:   PT Evaluation $PT Eval Low Complexity: 1 Low PT Treatments $Gait Training: 8-22 mins        [name] PT Acute Rehabilitation Services Pager [phone] Office [phone]   Selden Noteboom 03/22/2020, 2:13 PM

## 2020-03-22 NOTE — Plan of Care (Signed)
  Problem: Education: Goal: Knowledge of General Education information will improve Description: Including pain rating scale, medication(s)/side effects and non-pharmacologic comfort measures Outcome: Progressing   Problem: Health Behavior/Discharge Planning: Goal: Ability to manage health-related needs will improve Outcome: Progressing   Problem: Clinical Measurements: Goal: Ability to maintain clinical measurements within normal limits will improve Outcome: Progressing Goal: Will remain free from infection Outcome: Progressing Goal: Diagnostic test results will improve Outcome: Progressing Goal: Respiratory complications will improve Outcome: Progressing Goal: Cardiovascular complication will be avoided Outcome: Progressing   Problem: Activity: Goal: Risk for activity intolerance will decrease Outcome: Progressing   Problem: Coping: Goal: Level of anxiety will decrease Outcome: Progressing   Problem: Elimination: Goal: Will not experience complications related to bowel motility Outcome: Progressing   Problem: Pain Managment: Goal: General experience of comfort will improve Outcome: Progressing   Problem: Safety: Goal: Ability to remain free from injury will improve Outcome: Progressing   Problem: Skin Integrity: Goal: Risk for impaired skin integrity will decrease Outcome: Progressing   Problem: Education: Goal: Knowledge of the prescribed therapeutic regimen will improve Outcome: Progressing Goal: Individualized Educational Video(s) Outcome: Progressing   Problem: Activity: Goal: Ability to avoid complications of mobility impairment will improve Outcome: Progressing Goal: Range of joint motion will improve Outcome: Progressing   Problem: Clinical Measurements: Goal: Postoperative complications will be avoided or minimized Outcome: Progressing   Problem: Pain Management: Goal: Pain level will decrease with appropriate interventions Outcome:  Progressing   Problem: Skin Integrity: Goal: Will show signs of wound healing Outcome: Progressing

## 2020-03-22 NOTE — Progress Notes (Signed)
Physical Therapy Treatment Patient Details Name: Mark [name] MRN: [id number] DOB: 1955/08/20 Today's Date: 03/22/2020    History of Present Illness Pt s/p R TKR resection with placement of articulating antibiotic spacer.  Pt with hx of CVA, MI, ETOH abuse and R TKR 92015)    PT Comments    Pt continues cooperative but impulsive and with questionable safety awareness.  Pt up to ambulate in halls and limited HEP initiated.   Follow Up Recommendations  Follow surgeon's recommendation for DC plan and follow-up therapies     Equipment Recommendations  None recommended by PT    Recommendations for Other Services       Precautions / Restrictions Precautions Precautions: Knee;Fall Restrictions RLE Weight Bearing: Partial weight bearing RLE Partial Weight Bearing Percentage or Pounds: 50%    Mobility  Bed Mobility Overal bed mobility: Needs Assistance Bed Mobility: Supine to Sit;Sit to Supine     Supine to sit: Supervision Sit to supine: Supervision   General bed mobility comments: increased time with pt self assisting R LE with UEs  Transfers Overall transfer level: Needs assistance Equipment used: Rolling walker (2 wheeled) Transfers: Sit to/from Stand Sit to Stand: Min guard         General transfer comment: cues for LE management and use of UEs to self assist  Ambulation/Gait Ambulation/Gait assistance: Min guard Gait Distance (Feet): 120 Feet Assistive device: Rolling walker (2 wheeled) Gait Pattern/deviations: Step-to pattern;Decreased step length - right;Decreased step length - left;Shuffle;Trunk flexed Gait velocity: decr   General Gait Details: cues for sequence, posture, position from RW; pt with tendancy to bring bil LEs fwd simultaneously by swinging on arms   Stairs             Wheelchair Mobility    Modified Rankin (Stroke Patients Only)       Balance Overall balance assessment: Mild deficits observed, not formally tested                                           Cognition Arousal/Alertness: Awake/alert Behavior During Therapy: [facility] for tasks assessed/performed;Impulsive Overall Cognitive Status: Within Functional Limits for tasks assessed                                        Exercises Total Joint Exercises Ankle Circles/Pumps: AROM;Both;15 reps;Supine Quad Sets: AROM;Both;10 reps;Supine Heel Slides: AAROM;Right;15 reps;Supine Straight Leg Raises: AAROM;Right;Supine;15 reps Goniometric ROM: -8 - 60    General Comments        Pertinent Vitals/Pain Pain Assessment: 0-10 Pain Score: 3  Pain Location: R knee Pain Descriptors / Indicators: Aching;Sore Pain Intervention(s): Limited activity within patient's tolerance;Monitored during session;Premedicated before session;Ice applied    Home Living                      Prior Function            PT Goals (current goals can now be found in the care plan section) Acute Rehab PT Goals Patient Stated Goal: Home PT Goal Formulation: With patient Time For Goal Achievement: 03/29/20 Potential to Achieve Goals: Good Progress towards PT goals: Progressing toward goals    Frequency    7X/week      PT Plan Current plan remains appropriate    Co-evaluation  AM-PAC PT "6 Clicks" Mobility   Outcome Measure  Help needed turning from your back to your side while in a flat bed without using bedrails?: None Help needed moving from lying on your back to sitting on the side of a flat bed without using bedrails?: A Little Help needed moving to and from a bed to a chair (including a wheelchair)?: A Little Help needed standing up from a chair using your arms (e.g., wheelchair or bedside chair)?: A Little Help needed to walk in hospital room?: A Little Help needed climbing 3-5 steps with a railing? : A Lot 6 Click Score: 18    End of Session Equipment Utilized During Treatment: Gait  belt Activity Tolerance: Patient tolerated treatment well Patient left: in bed;with call bell/phone within reach;with bed alarm set Nurse Communication: Mobility status PT Visit Diagnosis: Difficulty in walking, not elsewhere classified (R26.2);Pain Pain - Right/Left: Left Pain - part of body: Knee     Time: 6770-3403 PT Time Calculation (min) (ACUTE ONLY): 37 min  Charges:  $Gait Training: 8-22 mins $Therapeutic Exercise: 8-22 mins                     [name] PT Acute Rehabilitation Services Pager [phone] Office [phone]    Benoit Meech 03/22/2020, 5:17 PM

## 2020-03-23 MED ORDER — SODIUM CHLORIDE 0.9 % IV SOLN
500.0000 mg | Freq: Every day | INTRAVENOUS | Status: DC
  Administered 2020-03-23: 500 mg via INTRAVENOUS
  Filled 2020-03-23: qty 10

## 2020-03-23 MED ORDER — HEPARIN SOD (PORK) LOCK FLUSH 100 UNIT/ML IV SOLN
250.0000 [IU] | INTRAVENOUS | Status: AC | PRN
  Administered 2020-03-23 (×2): 250 [IU]
  Filled 2020-03-23: qty 2.5

## 2020-03-23 NOTE — Progress Notes (Signed)
RN reviewed discharge instructions with patient and family. All questions answered.   Paperwork given. Prescriptions electronically sent to patient pharmacy.    NT rolled patient down with all belongings to family car.     Tomie Elko, RN  

## 2020-03-23 NOTE — Progress Notes (Signed)
Physical Therapy Treatment Patient Details Name: Mark [name] MRN: [id number] DOB: [birth year] Today's Date: 03/23/2020    History of Present Illness Pt s/p R TKR resection with placement of articulating antibiotic spacer.  Pt with hx of CVA, MI, ETOH abuse and R TKR 92015)    PT Comments    Noted improvement in pt safety awareness with mobility this am.  Pt up to ambulate in hall and to negotiate stairs - written instruction provided.  Pt reviewed don/doff KI and pleased with increased support with gait.   Follow Up Recommendations  Follow surgeon's recommendation for DC plan and follow-up therapies     Equipment Recommendations  None recommended by PT    Recommendations for Other Services       Precautions / Restrictions Precautions Precautions: Knee;Fall Required Braces or Orthoses: Knee Immobilizer - Right Knee Immobilizer - Right: Discontinue once straight leg raise with < 10 degree lag Restrictions RLE Weight Bearing: Partial weight bearing RLE Partial Weight Bearing Percentage or Pounds: 50%    Mobility  Bed Mobility Overal bed mobility: Needs Assistance Bed Mobility: Supine to Sit;Sit to Supine     Supine to sit: Supervision Sit to supine: Supervision   General bed mobility comments: increased time with pt self assisting R LE with UEs  Transfers Overall transfer level: Needs assistance Equipment used: Rolling walker (2 wheeled) Transfers: Sit to/from Stand Sit to Stand: Supervision         General transfer comment: cues for LE management and use of UEs to self assist  Ambulation/Gait Ambulation/Gait assistance: Min guard;Supervision Gait Distance (Feet): 100 Feet Assistive device: Rolling walker (2 wheeled) Gait Pattern/deviations: Step-to pattern;Decreased step length - right;Decreased step length - left;Shuffle;Trunk flexed Gait velocity: decr   General Gait Details: cues for sequence, posture, position from RW; pt with tendancy to bring  bil LEs fwd simultaneously by swinging on arms   Stairs Stairs: Yes Stairs assistance: Min assist Stair Management: No rails;Step to pattern;Backwards;With walker Number of Stairs: 4 General stair comments: 2 steps twice bkwd with cues for sequence and foot/RW placement   Wheelchair Mobility    Modified Rankin (Stroke Patients Only)       Balance Overall balance assessment: Mild deficits observed, not formally tested                                          Cognition Arousal/Alertness: Awake/alert Behavior During Therapy: WFL for tasks assessed/performed;Impulsive Overall Cognitive Status: Within Functional Limits for tasks assessed                                        Exercises Total Joint Exercises Ankle Circles/Pumps: AROM;Both;15 reps;Supine Quad Sets: AROM;Both;Supine;15 reps Heel Slides: AAROM;Right;15 reps;Supine Hip ABduction/ADduction: AAROM;Right;10 reps;Supine Straight Leg Raises: AAROM;Right;Supine;15 reps Goniometric ROM: -8-60    General Comments        Pertinent Vitals/Pain Pain Assessment: 0-10 Pain Score: 3  Pain Location: R knee Pain Descriptors / Indicators: Aching;Sore Pain Intervention(s): Limited activity within patient's tolerance;Monitored during session;Premedicated before session;Ice applied    Home Living                      Prior Function            PT Goals (current goals can now be  found in the care plan section) Acute Rehab PT Goals Patient Stated Goal: Home PT Goal Formulation: With patient Time For Goal Achievement: 03/29/20 Potential to Achieve Goals: Good Progress towards PT goals: Progressing toward goals    Frequency    7X/week      PT Plan Current plan remains appropriate    Co-evaluation              AM-PAC PT "6 Clicks" Mobility   Outcome Measure  Help needed turning from your back to your side while in a flat bed without using bedrails?: None Help  needed moving from lying on your back to sitting on the side of a flat bed without using bedrails?: A Little Help needed moving to and from a bed to a chair (including a wheelchair)?: A Little Help needed standing up from a chair using your arms (e.g., wheelchair or bedside chair)?: A Little Help needed to walk in hospital room?: A Little Help needed climbing 3-5 steps with a railing? : A Little 6 Click Score: 19    End of Session Equipment Utilized During Treatment: Gait belt Activity Tolerance: Patient tolerated treatment well Patient left: in bed;with call bell/phone within reach;with bed alarm set Nurse Communication: Mobility status PT Visit Diagnosis: Difficulty in walking, not elsewhere classified (R26.2);Pain Pain - Right/Left: Left Pain - part of body: Knee     Time: 0911-0943 PT Time Calculation (min) (ACUTE ONLY): 32 min  Charges:  $Gait Training: 8-22 mins $Therapeutic Exercise: 23-37 mins $Therapeutic Activity: 8-22 mins                     [name] PT Acute Rehabilitation Services Pager [phone] Office [phone]    [name] 03/23/2020, 10:35 AM

## 2020-03-23 NOTE — Progress Notes (Signed)
Physical Therapy Treatment Patient Details Name: Mark [name] MRN: [id number] DOB: Jan 08, 1956 Today's Date: 03/23/2020    History of Present Illness Pt s/p R TKR resection with placement of articulating antibiotic spacer.  Pt with hx of CVA, MI, ETOH abuse and R TKR 92015)    PT Comments    Pt performed HEP with assist - written instruction provided and reviewed.  R quads still very weak and KI requested prior to attempting stairs.   Follow Up Recommendations  Follow surgeon's recommendation for DC plan and follow-up therapies     Equipment Recommendations  None recommended by PT    Recommendations for Other Services       Precautions / Restrictions Precautions Precautions: Knee;Fall Required Braces or Orthoses:  (KI requested) Restrictions RLE Weight Bearing: Partial weight bearing RLE Partial Weight Bearing Percentage or Pounds: 50%    Mobility  Bed Mobility                  Transfers                    Ambulation/Gait                 Stairs             Wheelchair Mobility    Modified Rankin (Stroke Patients Only)       Balance                                            Cognition Arousal/Alertness: Awake/alert Behavior During Therapy: WFL for tasks assessed/performed;Impulsive Overall Cognitive Status: Within Functional Limits for tasks assessed                                        Exercises Total Joint Exercises Ankle Circles/Pumps: AROM;Both;15 reps;Supine Quad Sets: AROM;Both;Supine;15 reps Heel Slides: AAROM;Right;15 reps;Supine Hip ABduction/ADduction: AAROM;Right;10 reps;Supine Straight Leg Raises: AAROM;Right;Supine;15 reps Goniometric ROM: -8-60    General Comments        Pertinent Vitals/Pain Pain Assessment: 0-10 Pain Score: 3  Pain Location: R knee Pain Descriptors / Indicators: Aching;Sore Pain Intervention(s): Limited activity within patient's  tolerance;Monitored during session;Premedicated before session    Home Living                      Prior Function            PT Goals (current goals can now be found in the care plan section) Acute Rehab PT Goals Patient Stated Goal: Home PT Goal Formulation: With patient Time For Goal Achievement: 03/29/20 Potential to Achieve Goals: Good Progress towards PT goals: Progressing toward goals    Frequency    7X/week      PT Plan Current plan remains appropriate    Co-evaluation              AM-PAC PT "6 Clicks" Mobility   Outcome Measure  Help needed turning from your back to your side while in a flat bed without using bedrails?: None Help needed moving from lying on your back to sitting on the side of a flat bed without using bedrails?: A Little Help needed moving to and from a bed to a chair (including a wheelchair)?: A Little Help needed standing up from a chair using  your arms (e.g., wheelchair or bedside chair)?: A Little Help needed to walk in hospital room?: A Little Help needed climbing 3-5 steps with a railing? : A Lot 6 Click Score: 18    End of Session Equipment Utilized During Treatment: Gait belt Activity Tolerance: Patient tolerated treatment well Patient left: in bed;with call bell/phone within reach;with bed alarm set Nurse Communication: Mobility status PT Visit Diagnosis: Difficulty in walking, not elsewhere classified (R26.2);Pain Pain - Right/Left: Left Pain - part of body: Knee     Time: 0825-0850 PT Time Calculation (min) (ACUTE ONLY): 25 min  Charges:  $Therapeutic Exercise: 23-37 mins                     [place] Pager [phone] Office [phone]    Devere Brem 03/23/2020, 10:31 AM

## 2020-03-23 NOTE — Progress Notes (Addendum)
Patient ID: Mark [name], male   DOB: 05-28-1955, 64 y.o.   MRN: [id number] Subjective: 3 Days Post-Op Procedure(s) (LRB): Resection right total knee arthroplasty and placement of antibiotic spacer (Right)    Patient reports pain as mild.  Ready to gp home today  Objective:   VITALS:   Vitals:   03/22/20 2117 03/23/20 0532  BP: 123/61 (!) 115/50  Pulse: 63 71  Resp: 16 18  Temp: 98.5 F (36.9 C) 98.9 F (37.2 C)  SpO2: 100% 98%    Neurovascular intact Incision: dressing C/D/I  LABS Recent Labs    03/21/20 0953 03/22/20 0339  HGB 5.9* 8.2*  HCT 18.2* 24.8*  WBC 6.1 8.9  PLT 166 156    Recent Labs    03/21/20 0953 03/22/20 0339  NA 138 138  K 5.1 5.3*  BUN 25* 37*  CREATININE 1.46* 1.99*  GLUCOSE 102* 113*    No results for input(s): LABPT, INR in the last 72 hours.   Assessment/Plan: 3 Days Post-Op Procedure(s) (LRB): Resection right total knee arthroplasty and placement of antibiotic spacer (Right)  Acute on chronic blood loss anemia - received 2 units of PRBCs, hgb returned to pre-admission levels  Up with therapy Continue ABX therapy due to septic right TKR status post resection  Will hold Spironolactone until follow up with PCP Needs PCP follow up in 1-2 weeks to check BP and labs (renal function, potassium, etc)  PWB RLE IV Abx per ID for 6 weeks Follow up with ID and Ortho in 2 weeks  I will work on a consult with or referral to a dentist for evaluation and management of possibly infected teeth

## 2020-03-23 NOTE — TOC Transition Note (Signed)
Transition of Care [facility]) - CM/SW Discharge Note   Patient Details  Name: Mark [name] MRN: [id number] Date of Birth: [birth year]  Transition of Care [facility] CM/SW Contact:  Lia Hopping, [place] Phone Number: 03/23/2020, 10:29 AM   Clinical Narrative:    Patient will discharge home. CSW confirm w/ [facility] Staff Pam the agency is ware of d/c and scheduled to administer IV antibiotic medications.  No other needs identified.    Final next level of care: [place] Barriers to Discharge: Barriers Resolved   Patient Goals and CMS Choice Patient states their goals for this hospitalization and ongoing recovery are:: "Stop Hurting"      Discharge Placement                       Discharge Plan and Services   Discharge Planning Services: CM Consult                      HH Arranged: RN, IV Antibiotics HH Agency: [facility] ([facility]) Date HH Agency Contacted: 03/23/20 Time [place]: 1029 Representative spoke with at [place]: [place] ([place]) Interventions     Readmission Risk Interventions Readmission Risk Prevention Plan 10/03/2018  Transportation Screening Complete  Medication Review Press photographer) Complete  PCP or Specialist appointment within 3-5 days of discharge Complete  HRI or [place] Not Complete  HRI or Home Care Consult Pt Refusal Comments Pt stated he wanted to go home.  Did not discuss discharging with Home Health services.  SW Recovery Care/Counseling Consult Complete  Palliative Care Screening Not [place] Patient Refused  Some recent data might be hidden

## 2020-03-23 NOTE — Plan of Care (Signed)
Plan of care reviewed and discussed with the patient. 

## 2020-03-27 ENCOUNTER — Encounter (HOSPITAL_COMMUNITY): Payer: Self-pay | Admitting: Orthopedic Surgery

## 2020-03-27 NOTE — Discharge Summary (Signed)
Patient ID: Mark [name] MRN: [id number] DOB/AGE: July 01, 1955 64 y.o.  Admit date: 03/20/2020 Discharge date: 03/27/2020  Admission Diagnoses:  Active Problems:   Infection of total left knee replacement [facility])   Discharge Diagnoses:  Same  Past Medical History:  Diagnosis Date  . Alcohol abuse   . Anemia   . Arthritis   . Ascites   . Cirrhosis ([place])   . Coffee ground emesis   . Dehydration 06/17/2017  . Febrile illness   . Heart murmur   . History of blood transfusion   . Hyperlipidemia   . Hypertension   . Leg swelling   . Myocardial infarction ([place]) 2012  . Preop cardiovascular exam 04/14/2013  . Sepsis ([place]) 06/17/2017  . Septic shock ([place]) 06/18/2017  . SIRS (systemic inflammatory response syndrome) ([place]) 07/11/2017  . Stroke [facility]) 2012   no deficits  . Thrombocytopenia ([employer])     Surgeries: Procedure(s):   Resection right total knee arthroplasty and placement of antibiotic spacer on 03/20/2020   Consultants: Infectious Disease  Discharged Condition: Improved  Hospital Course: Mark [name] is an 64 y.o. male who was admitted 03/20/2020 for operative treatment of infected right TKA . Patient has severe unremitting pain that affects sleep, daily activities, and work/hobbies. After pre-op clearance the patient was taken to the operating room on 03/20/2020 and underwent  Procedure(s): Resection right total knee arthroplasty and placement of antibiotic spacer.    Patient was given perioperative antibiotics:  Anti-infectives (From admission, onward)   Start     Dose/Rate Route Frequency Ordered Stop   03/23/20 1130  DAPTOmycin (CUBICIN) 500 mg in sodium chloride 0.9 % IVPB  Status:  Discontinued        500 mg 220 mL/hr over 30 Minutes Intravenous Daily 03/23/20 0931 03/23/20 2102   03/22/20 0000  daptomycin (CUBICIN) IVPB        500 mg Intravenous Every 24 hours 03/22/20 1012 05/02/20 2359   03/21/20 0600  vancomycin (VANCOCIN) 1,000 mg in sodium  chloride 0.9 % 250 mL IVPB  Status:  Discontinued        1,000 mg 250 mL/hr over 60 Minutes Intravenous On call to O.R. 03/20/20 1208 03/20/20 1209   03/20/20 2000  DAPTOmycin (CUBICIN) 500 mg in sodium chloride 0.9 % IVPB  Status:  Discontinued        500 mg 220 mL/hr over 30 Minutes Intravenous Daily 03/20/20 1609 03/23/20 0931   03/20/20 1259  vancomycin (VANCOCIN) powder  Status:  Discontinued          As needed 03/20/20 1259 03/20/20 1530   03/20/20 1217  tobramycin (NEBCIN) powder  Status:  Discontinued          As needed 03/20/20 1217 03/20/20 1530   03/20/20 1217  vancomycin (VANCOCIN) powder  Status:  Discontinued          As needed 03/20/20 1217 03/20/20 1530   03/20/20 1215  vancomycin (VANCOCIN) IVPB 1000 mg/200 mL premix  Status:  Discontinued        1,000 mg 200 mL/hr over 60 Minutes Intravenous On call to O.R. 03/20/20 1209 03/20/20 1602   03/20/20 1200  vancomycin (VANCOCIN) 1,000 mg in sodium chloride 0.9 % 250 mL IVPB  Status:  Discontinued        1,000 mg 250 mL/hr over 60 Minutes Intravenous Every 12 hours 03/20/20 1150 03/20/20 1207   03/20/20 1144  ceFAZolin (ANCEF) 2-4 GM/100ML-% IVPB  Status:  Discontinued  Note to Pharmacy: Margurite Auerbach   : cabinet override      03/20/20 1144 03/20/20 1206   03/20/20 1144  vancomycin (VANCOCIN) 1-5 GM/200ML-% IVPB       Note to Pharmacy: Margurite Auerbach   : cabinet override      03/20/20 1144 03/20/20 2359       Patient was given sequential compression devices, early ambulation, and chemoprophylaxis to prevent DVT.  Patient benefited maximally from hospital stay and there were no complications.    Recent vital signs: No data found.   Recent laboratory studies: No results for input(s): WBC, HGB, HCT, PLT, NA, K, CL, CO2, BUN, CREATININE, GLUCOSE, INR, CALCIUM in the last 72 hours.  Invalid input(s): PT, 2   Discharge Medications:   Allergies as of 03/23/2020   No Known Allergies     Medication List    STOP  taking these medications   albuterol 108 (90 Base) MCG/ACT inhaler Commonly known as: VENTOLIN HFA   doxycycline 100 MG tablet Commonly known as: VIBRA-TABS   ketoconazole 2 % shampoo Commonly known as: NIZORAL   multivitamin with minerals Tabs tablet   ondansetron 4 MG tablet Commonly known as: ZOFRAN   oxyCODONE 5 MG immediate release tablet Commonly known as: Oxy IR/ROXICODONE   spironolactone 50 MG tablet Commonly known as: ALDACTONE   thiamine 100 MG tablet Commonly known as: Vitamin B-1   traMADol 50 MG tablet Commonly known as: ULTRAM   triamcinolone 0.025 % ointment Commonly known as: KENALOG     TAKE these medications   aspirin 81 MG chewable tablet Commonly known as: Aspirin Childrens Chew 1 tablet (81 mg total) by mouth 2 (two) times daily. Take for 4 weeks, then resume regular dose.   daptomycin  IVPB Commonly known as: CUBICIN Inject 500 mg into the vein daily. Indication:  MRSA PJI of right knee First Dose: Yes Last Day of Therapy:  05/01/2020 Labs - Once weekly:  CBC/D, BMP, and CPK Labs - Every other week:  ESR and CRP Method of administration: IV Push Method of administration may be changed at the discretion of home infusion pharmacist based upon assessment of the patient and/or caregiver's ability to self-administer the medication ordered.   docusate sodium 100 MG capsule Commonly known as: Colace Take 1 capsule (100 mg total) by mouth 2 (two) times daily. Notes to patient: Stool softener   ferrous sulfate 325 (65 FE) MG tablet Commonly known as: FerrouSul Take 1 tablet (325 mg total) by mouth 3 (three) times daily with meals for 14 days. Notes to patient: 3 times daily after meals    folic acid 1 MG tablet Commonly known as: FOLVITE TAKE 1 TABLET(1 MG) BY MOUTH DAILY What changed: See the new instructions.   furosemide 20 MG tablet Commonly known as: LASIX TAKE 1 TABLET(20 MG) BY MOUTH TWICE DAILY What changed: See the new  instructions.   HYDROcodone-acetaminophen 7.5-325 MG tablet Commonly known as: Norco Take 1-2 tablets by mouth every 4 (four) hours as needed for moderate pain.   lactulose 10 GM/15ML solution Commonly known as: CHRONULAC TAKE 45 MILLILITERS BY MOUTH TWICE DAILY What changed:   how much to take  how to take this  when to take this  additional instructions   methocarbamol 500 MG tablet Commonly known as: Robaxin Take 1 tablet (500 mg total) by mouth every 6 (six) hours as needed for muscle spasms.   nadolol 20 MG tablet Commonly known as: CORGARD TAKE 1 TABLET BY MOUTH  EVERY DAY   pantoprazole 40 MG tablet Commonly known as: PROTONIX Take 1 tablet (40 mg total) by mouth daily.   polyethylene glycol 17 g packet Commonly known as: MIRALAX / GLYCOLAX Take 17 g by mouth 2 (two) times daily.            Discharge Care Instructions  (From admission, onward)         Start     Ordered   03/22/20 0000  Change dressing on IV access line weekly and PRN  (Home infusion instructions - [facility] )        03/22/20 1012   03/22/20 0000  Change dressing       Comments: Maintain surgical dressing until follow up in the clinic. If the edges start to pull up, may reinforce with tape. If the dressing is no longer working, may remove and cover with gauze and tape, but must keep the area dry and clean.  Call with any questions or concerns.   03/22/20 1018   03/22/20 0000  Partial weight bearing       Question Answer Comment  % Body Weight 50   Laterality right   Extremity Lower      03/22/20 1018          Diagnostic Studies: IR PICC PLACEMENT RIGHT >5 YRS INC IMG GUIDE  Result Date: 03/21/2020 INDICATION: Patient with history of right total knee arthroplasty infection status post resection with placement of antibiotic spacer; request received for central venous access for antibiotic therapy. EXAM: ULTRASOUND AND FLUOROSCOPIC GUIDED PICC LINE INSERTION  MEDICATIONS: 1% lidocaine to skin and subcutaneous tissue ANESTHESIA/SEDATION: None FLUOROSCOPY TIME:  Fluoroscopy Time:  36 seconds (2 mGy). COMPLICATIONS: None immediate. TECHNIQUE: The procedure, risks, benefits, and alternatives were explained to the patient and informed written consent was obtained. A timeout was performed prior to the initiation of the procedure. The right upper extremity was prepped with chlorhexidine in a sterile fashion, and a sterile drape was applied covering the operative field. Maximum barrier sterile technique with sterile gowns and gloves were used for the procedure. A timeout was performed prior to the initiation of the procedure. Local anesthesia was provided with 1% lidocaine. Under direct ultrasound guidance, the right brachial vein was accessed with a micropuncture kit after the overlying soft tissues were anesthetized with 1% lidocaine. An ultrasound image was saved for documentation purposes. A guidewire was advanced to the level of the superior caval-atrial junction for measurement purposes and the PICC line was cut to length. A peel-away sheath was placed and a 36 cm, 5 [place], dual lumen was inserted to level of the superior caval-atrial junction. A post procedure spot fluoroscopic was obtained. The catheter easily aspirated and flushed and was sutured in place. A dressing was placed. The patient tolerated the procedure well without immediate post procedural complication. FINDINGS: After catheter placement, the tip lies within the superior cavoatrial junction. The catheter aspirates and flushes normally and is ready for immediate use. IMPRESSION: Successful ultrasound and fluoroscopic guided placement of a right brachial vein approach, 36 cm, 5 French, dual lumen PICC with tip at the superior caval-atrial junction. The PICC line is ready for immediate use. Read by: Rowe Robert, PA-C Electronically Signed   By: Sandi Mariscal M.D.   On: 03/21/2020 13:50   [place] EKG SITE  RITE  Result Date: 03/20/2020 If Site Rite image not attached, placement could not be confirmed due to current cardiac rhythm.  [place] EKG SITE RITE  Result  Date: 03/20/2020 If Site Rite image not attached, placement could not be confirmed due to current cardiac rhythm.   Disposition: Home  Discharge Instructions    [facility] pharmacist to adjust dose for Vancomycin, Aminoglycosides and other anti-infective therapies as requested by physician.   Complete by: As directed    [facility] to provide Cath Flo 76m   Complete by: As directed    Administer for PICC line occlusion and as ordered by physician for other access device issues.   Anaphylaxis Kit: Provided to treat any anaphylactic reaction to the medication being provided to the patient if First Dose or when requested by physician   Complete by: As directed    Epinephrine 186mml vial / amp: Administer 0.72m27m0.72ml35mubcutaneously once for moderate to severe anaphylaxis, nurse to call physician and pharmacy when reaction occurs and call 911 if needed for immediate care   Diphenhydramine 50mg82mIV vial: Administer 25-50mg 37mM PRN for first dose reaction, rash, itching, mild reaction, nurse to call physician and pharmacy when reaction occurs   Sodium Chloride 0.9% NS 500ml I99mdminister if needed for hypovolemic blood pressure drop or as ordered by physician after call to physician with anaphylactic reaction   Call MD / Call 911   Complete by: As directed    If you experience chest pain or shortness of breath, CALL 911 and be transported to the hospital emergency room.  If you develope a fever above 101 F, pus (white drainage) or increased drainage or redness at the wound, or calf pain, call your surgeon's office.   Change dressing   Complete by: As directed    Maintain surgical dressing until follow up in the clinic. If the edges start to pull up, may reinforce with tape. If the dressing is no longer working, may  remove and cover with gauze and tape, but must keep the area dry and clean.  Call with any questions or concerns.   Change dressing on IV access line weekly and PRN   Complete by: As directed    Constipation Prevention   Complete by: As directed    Drink plenty of fluids.  Prune juice may be helpful.  You may use a stool softener, such as Colace (over the counter) 100 mg twice a day.  Use MiraLax (over the counter) for constipation as needed.   Diet - low sodium heart healthy   Complete by: As directed    Discharge instructions   Complete by: As directed    Maintain surgical dressing until follow up in the clinic. If the edges start to pull up, may reinforce with tape. If the dressing is no longer working, may remove and cover with gauze and tape, but must keep the area dry and clean.  [facility]   Flush IV access with Sodium Chloride 0.9% and Heparin 10 units/ml or 100 units/ml   Complete by: As directed    Home infusion instructions - [facility]   Complete by: As directed    Instructions: Flush IV access with Sodium Chloride 0.9% and Heparin 10units/ml or 100units/ml   Change dressing on IV access line: Weekly and PRN   Instructions Cath Flo 2mg: Ad59mister for PICC Line occlusion and as ordered by physician for other access device   [facility] pharmacist to adjust dose for: Vancomycin, Aminoglycosides and other anti-infective therapies as requested by physician   Method of administration may  be changed at the discretion of home infusion pharmacist based upon assessment of the patient and/or caregiver's ability to self-administer the medication ordered   Complete by: As directed    Partial weight bearing   Complete by: As directed    % Body Weight: 50   Laterality: right   Extremity: Lower   TED hose   Complete by: As directed    Use stockings (TED hose) for 2 weeks on both leg(s).  You may remove  them at night for sleeping.       Follow-up Information    Paralee Cancel, MD. Schedule an appointment as soon as possible for a visit in 2 weeks.   Specialty: Orthopedic Surgery Contact information: [address] [place] [address] [phone]                Signed: Lucille Passy [facility] 03/27/2020, 10:11 AM

## 2020-03-28 ENCOUNTER — Other Ambulatory Visit: Payer: Self-pay

## 2020-03-28 ENCOUNTER — Ambulatory Visit (HOSPITAL_COMMUNITY): Payer: Self-pay | Admitting: Dentistry

## 2020-03-28 DIAGNOSIS — M270 Developmental disorders of jaws: Secondary | ICD-10-CM

## 2020-03-28 DIAGNOSIS — M278 Other specified diseases of jaws: Secondary | ICD-10-CM

## 2020-03-28 DIAGNOSIS — K047 Periapical abscess without sinus: Secondary | ICD-10-CM

## 2020-03-28 DIAGNOSIS — K083 Retained dental root: Secondary | ICD-10-CM

## 2020-03-28 DIAGNOSIS — K029 Dental caries, unspecified: Secondary | ICD-10-CM

## 2020-03-28 DIAGNOSIS — K08409 Partial loss of teeth, unspecified cause, unspecified class: Secondary | ICD-10-CM

## 2020-03-28 DIAGNOSIS — K045 Chronic apical periodontitis: Secondary | ICD-10-CM

## 2020-03-28 NOTE — Progress Notes (Addendum)
DENTAL VISIT OUTPATIENT CONSULTATION   Patient Name:   Mark [name] Date of Birth:   March 31, 1956 Medical Record Number: [id number]   Referring Provider:                  Paralee Cancel, MD      PLAN & RECOMMENDATIONS  >> Patient does have multiple retained root tips that are chronically infected that may or may not have contributed or caused his recent R-TKR infection; regardless of this, recommend extraction of root tips to decrease the risk of recurrent systemic infection and pain. >> He is interested in having dentures made, and in order to prepare him for this he needs Mandibular tori reduction/exostosis removal.  I recommend he see an oral surgeon for a consult to discuss having all surgical procedures completed at one time.  I discussed this with the patient and he is agreeable to this plan.  >> Referral was sent and I will follow-up with the patient as needed.  >> Recommend patient establish care at a dental office of his choice for routine dental care including replacement of missing teeth and exams. >> Discussed in detail all treatment options with the patient and he is agreeable to the plan. ____________________________________________________________ Thank you for consulting with [facility] and for the opportunity to participate in this patient's treatment.  Should you have any questions or concerns, please contact the [facility] at [phone]. ______________________________________________________    Progress Note    Service Date:   03/28/2020 Patient Name:   Mark [name] Referring Provider:                  Paralee Cancel, MD   COVID 19 SCREENING: The patient denies symptoms concerning for COVID-19 infection including fever, chills, cough, or newly developed shortness of breath.  HPI: Mark [name] is a very pleasant 63 y.o. male with h/o infection of Right total knee replacement currently on IV antibiotics, CAD, anemia, cirrhosis  of liver secondary to alcohol abuse, GERD, HTN, Hyperlipidemia and arthritis who presents today for a dental evaluation to rule out any potential source of infection.   Dental History: Patient reports having multiple teeth extracted in the past, but states that he feels like he still has the roots in his gums.  He currently denies any dental/oral pain or sensitivity. Patient able to manage oral secretions.  Patient denies dysphagia, odynophagia, dysphonia, SOB and neck pain.  Patient denies fever, rigors and malaise.  CHIEF COMPLAINT: "My teeth may have caused the infection in my knee."  Patient Active Problem List   Diagnosis Date Noted  . Infection of total left knee replacement ([place]) 03/20/2020  . Cirrhosis of liver without ascites ([place])   . AKI (acute kidney injury) ([place])   . Bacteremia   . Spinal stenosis of lumbar region without neurogenic claudication   . Aspiration pneumonia ([place]) 09/24/2018  . Abscess in epidural space of lumbar spine   . MRSA bacteremia 09/21/2018  . Septic arthritis ([place]) 09/20/2018  . Anemia of chronic disease 09/20/2018  . Hypoalbuminemia 09/20/2018  . Hypoglycemia without diagnosis of diabetes mellitus 09/20/2018  . Epidural abscess 09/20/2018  . Hyperkalemia 05/18/2018  . Edentulous 05/11/2018  . Pancytopenia ([place]) 05/10/2018  . CAD (coronary artery disease) 05/10/2018  . Hepatic encephalopathy ([place]) 05/10/2018  . Alcohol abuse 05/10/2018  . GERD (gastroesophageal reflux disease) 05/10/2018  . Duodenal ulcer   . Renal failure   . Acute on chronic anemia   . Hypotension 06/17/2017  .  Acute on chronic renal failure ([place]) 06/17/2017  . Hyponatremia 06/17/2017  . Thrombocytopenia ([place]) 06/17/2017  . Leg edema, right 06/17/2017  . Acute metabolic encephalopathy 85/46/2703  . Anemia, iron deficiency   . Benign neoplasm of ascending colon   . Hemorrhoids   . Portal hypertensive gastropathy ([place])   . Gastritis and gastroduodenitis   . Esophageal  varices without bleeding ([place])   . H/O: CVA (cerebrovascular accident) 12/01/2013  . ACS (acute coronary syndrome) ([place]) 12/01/2013  . Anasarca 12/01/2013  . Polysubstance abuse ([place]) 12/01/2013  . CKD (chronic kidney disease) stage 3, GFR 30-59 ml/min ([name]) 12/01/2013  . S/P right TKA 05/02/2013  . Murmur 04/14/2013  . Right inguinal hernia 12/26/2010  . Cirrhosis, alcoholic ([place]) 50/12/3816   Past Medical History:  Diagnosis Date  . Alcohol abuse   . Anemia   . Arthritis   . Ascites   . Cirrhosis ([place])   . Coffee ground emesis   . Dehydration 06/17/2017  . Febrile illness   . Heart murmur   . History of blood transfusion   . Hyperlipidemia   . Hypertension   . Leg swelling   . Myocardial infarction ([place]) 2012  . Preop cardiovascular exam 04/14/2013  . Sepsis ([place]) 06/17/2017  . Septic shock ([place]) 06/18/2017  . SIRS (systemic inflammatory response syndrome) ([place]) 07/11/2017  . Stroke [facility]) 2012   no deficits  . Thrombocytopenia ([place])    Past Surgical History:  Procedure Laterality Date  . COLONOSCOPY WITH PROPOFOL N/A 02/07/2016   Procedure: COLONOSCOPY WITH PROPOFOL;  Surgeon: Milus Banister, MD;  Location: [facility] ENDOSCOPY;  Service: Endoscopy;  Laterality: N/A;  . ESOPHAGOGASTRODUODENOSCOPY (EGD) WITH PROPOFOL N/A 02/07/2016   Procedure: ESOPHAGOGASTRODUODENOSCOPY (EGD) WITH PROPOFOL;  Surgeon: Milus Banister, MD;  Location: [facility] ENDOSCOPY;  Service: Endoscopy;  Laterality: N/A;  . ESOPHAGOGASTRODUODENOSCOPY (EGD) WITH PROPOFOL N/A 06/22/2017   Procedure: ESOPHAGOGASTRODUODENOSCOPY (EGD) WITH PROPOFOL;  Surgeon: Jerene Bears, MD;  Location: [facility] ENDOSCOPY;  Service: Gastroenterology;  Laterality: N/A;  . EXCISIONAL TOTAL KNEE ARTHROPLASTY WITH ANTIBIOTIC SPACERS Right 03/20/2020   Procedure: Resection right total knee arthroplasty and placement of antibiotic spacer;  Surgeon: Paralee Cancel, MD;  Location: [facility] ORS;  Service: Orthopedics;  Laterality: Right;  90 mins  . HERNIA  REPAIR Right    inguinal  . KNEE ARTHROSCOPY     bilateral/  12/14  . TEE WITHOUT CARDIOVERSION N/A 05/13/2018   Procedure: TRANSESOPHAGEAL ECHOCARDIOGRAM (TEE);  Surgeon: [name], MD;  Location: [facility] ENDOSCOPY;  Service: Cardiovascular;  Laterality: N/A;  . TOTAL KNEE ARTHROPLASTY Right 05/02/2013   Procedure: RIGHT TOTAL KNEE ARTHROPLASTY;  Surgeon: Mauri Pole, MD;  Location: [facility] ORS;  Service: Orthopedics;  Laterality: Right;   No Known Allergies Current Outpatient Medications  Medication Sig Dispense Refill  . aspirin (ASPIRIN CHILDRENS) 81 MG chewable tablet Chew 1 tablet (81 mg total) by mouth 2 (two) times daily. Take for 4 weeks, then resume regular dose. 60 tablet 0  . daptomycin (CUBICIN) IVPB Inject 500 mg into the vein daily. Indication:  MRSA PJI of right knee First Dose: Yes Last Day of Therapy:  05/01/2020 Labs - Once weekly:  CBC/D, BMP, and CPK Labs - Every other week:  ESR and CRP Method of administration: IV Push Method of administration may be changed at the discretion of home infusion pharmacist based upon assessment of the patient and/or caregiver's ability to self-administer the medication ordered. 41 Units 0  . docusate sodium (COLACE) 100 MG capsule  Take 1 capsule (100 mg total) by mouth 2 (two) times daily. 28 capsule 0  . ferrous sulfate (FERROUSUL) 325 (65 FE) MG tablet Take 1 tablet (325 mg total) by mouth 3 (three) times daily with meals for 14 days. 42 tablet 0  . folic acid (FOLVITE) 1 MG tablet TAKE 1 TABLET(1 MG) BY MOUTH DAILY (Patient taking differently: Take 1 mg by mouth daily. ) 90 tablet 0  . furosemide (LASIX) 20 MG tablet TAKE 1 TABLET(20 MG) BY MOUTH TWICE DAILY (Patient taking differently: Take 20 mg by mouth 2 (two) times daily. ) 180 tablet 0  . HYDROcodone-acetaminophen (NORCO) 7.5-325 MG tablet Take 1-2 tablets by mouth every 4 (four) hours as needed for moderate pain. 60 tablet 0  . lactulose (CHRONULAC) 10 GM/15ML solution TAKE 45  MILLILITERS BY MOUTH TWICE DAILY (Patient taking differently: Take 30 g by mouth 2 (two) times daily. ) 2700 mL 3  . methocarbamol (ROBAXIN) 500 MG tablet Take 1 tablet (500 mg total) by mouth every 6 (six) hours as needed for muscle spasms. 40 tablet 0  . nadolol (CORGARD) 20 MG tablet TAKE 1 TABLET BY MOUTH EVERY DAY (Patient taking differently: Take 20 mg by mouth daily. ) 30 tablet 1  . pantoprazole (PROTONIX) 40 MG tablet Take 1 tablet (40 mg total) by mouth daily. 90 tablet 0  . polyethylene glycol (MIRALAX / GLYCOLAX) 17 g packet Take 17 g by mouth 2 (two) times daily. 28 packet 0   No current facility-administered medications for this visit.    LABS: Lab Results  Component Value Date   WBC 8.9 03/22/2020   HGB 8.2 (L) 03/22/2020   HCT 24.8 (L) 03/22/2020   MCV 94.7 03/22/2020   PLT 156 03/22/2020      Component Value Date/Time   NA 138 03/22/2020 0339   K 5.3 (H) 03/22/2020 0339   CL 115 (H) 03/22/2020 0339   CO2 17 (L) 03/22/2020 0339   GLUCOSE 113 (H) 03/22/2020 0339   BUN 37 (H) 03/22/2020 0339   CREATININE 1.99 (H) 03/22/2020 0339   CREATININE 1.84 (H) 11/15/2018 1533   CALCIUM 8.2 (L) 03/22/2020 0339   GFRNONAA 36 (L) 03/22/2020 0339   GFRAA 31 (L) 12/24/2018 0831   Lab Results  Component Value Date   INR 1.2 12/22/2018   INR 1.47 05/10/2018   INR 1.39 07/14/2017   No results found for: PTT  Social History   Socioeconomic History  . Marital status: Married    Spouse name: Not on file  . Number of children: 4  . Years of education: Not on file  . Highest education level: Not on file  Occupational History  . Occupation: Maintenance    Employer: [employer]  Tobacco Use  . Smoking status: Former Smoker    Packs/day: 0.50    Years: 10.00    Pack years: 5.00    Types: Cigarettes    Quit date: 01/05/2016    Years since quitting: 4.2  . Smokeless tobacco: Never Used  . Tobacco comment: 4 cigarettes a day  Vaping Use  . Vaping Use: Never used   Substance and Sexual Activity  . Alcohol use: Yes    Comment: very little  . Drug use: Not Currently    Types: Marijuana, Heroin  . Sexual activity: Not Currently    Comment: now and then smokies marijuana  Other Topics Concern  . Not on file  Social History Narrative  . Not on file  Social Determinants of Health   Financial Resource Strain: Medium Risk  . Difficulty of Paying Living Expenses: Somewhat hard  Food Insecurity: No Food Insecurity  . Worried About Charity fundraiser in the Last Year: Never true  . Ran Out of Food in the Last Year: Never true  Transportation Needs: No Transportation Needs  . Lack of Transportation (Medical): No  . Lack of Transportation (Non-Medical): No  Physical Activity: Inactive  . Days of Exercise per Week: 0 days  . Minutes of Exercise per Session: 0 min  Stress: No Stress Concern Present  . Feeling of Stress : Not at all  Social Connections: Moderately Integrated  . Frequency of Communication with Friends and Family: More than three times a week  . Frequency of Social Gatherings with Friends and Family: More than three times a week  . Attends Religious Services: More than 4 times per year  . Active Member of Clubs or Organizations: No  . Attends Archivist Meetings: Never  . Marital Status: Married  Human resources officer Violence: Not At Risk  . Fear of Current or Ex-Partner: No  . Emotionally Abused: No  . Physically Abused: No  . Sexually Abused: No   Family History  Problem Relation Age of Onset  . Hypertension Father   . Diabetes Father   . Dementia Mother   . Lupus Sister     REVIEW OF SYSTEMS: Reviewed with the patient as per HPI. PSYCH: Patient denies having dental phobia.   VITAL SIGNS: BP (!) 106/59 (BP Location: Right Arm)   Pulse 67   Temp 98.4 F (36.9 C)    PHYSICAL EXAMINATION: GENERAL: Well-developed, comfortable and in no apparent distress. NEUROLOGICAL: Alert and oriented to person, place and  time. EXTRAORAL:  Facial symmetry present without any edema or erythema.  No swelling or lymphadenopathy evident. INTRAORAL: Soft tissues appear well-perfused and mucous membranes moist.  FOM and vestibules soft and not raised. Oral cavity without mass or lesion. Parulis evident on facial gingival mucosa near apices of retained roots #23-#26. Very large bilateral mandibular tori and exostosis.  Bilateral maxillary exostosis in posterior regions.  DENTAL EXAMINATION: DENTITION: Missing teeth. Retained root tips #23-#26 not visible clinically. #2 retained root visible clinically. PERIODONTAL: Erythematous gingival tissue covering retained roots #23-#26. DENTAL CARIES: #2, #23, #24, #25 and #26 severe decay/retained root tips ENDODONTIC: #23, #24, #25, #26 necrotic pulp with chronic apical abscess. PROSTHODONTIC: Patient denies wearing dentures or partial dentures, but is interested in having some made in the future. OCCLUSION: Class III tendency, collapsed VDO. Edentulous maxilla and mandible.  RADIOGRAPHIC EXAMINATION PAN and (3) Periapical radiographs exposed and interpreted: Condyles seated bilaterally in fossas.  All visualized osseous structures appear WNL. Small, radiopaque circular regions evident on the left side just under the angle of the mandible which may be indicative of carotid artery calcifications vs sialolith  Large, bilateral radiopaque regions in mandible likely from mandibular tori. Missing teeth, caries/ retained root tips #2, #23, #24, #25, #26. #23-#26 periapical radiolucencies.  #2 soft-tissue impacted.  NOTE: Radiopaque regions are likely carotid calcifications: per [facility] documentation "atherosclerotic calcification of the carotid siphons" were identified in 11/2018 on a CT Head Wo Contrast.    ASSESSMENT 1. Infection of Right Total Knee Replacement  2. Coronary Artery Disease 3. GERD 4. Cirrhosis of liver d/t alcohol abuse 5. Chronic Kidney disease 6. Missing  teeth 7. Caries 8. Retained dental root 9. Chronic apical periodontitis 10. Periapical abscess w/out sinus 11. Bilateral mandibular  tori 12. Exostosis of Maxilla and Mandible   PLAN/RECOMMENDATIONS  . I discussed the risks, benefits, and complications of various treatment options with the patient in relationship to his medical and dental conditions. We discussed various treatment options to include no treatment, multiple extractions with alveoloplasty, pre-prosthetic surgery as indicated, crown and bridge therapy, implant therapy, and replacement of missing teeth as indicated.  Explained to patient that he does have multiple chronically infected root tips that may or may not have contributed to his recent prosthetic knee infection, however regardless of this they need to be removed to decrease his risk of future recurrent systemic infection and pain. . The patient is interested in complete dentures, so it was also discussed with pt that due to the size of his extra bone, he will need to have at least a reduction of it surgically for adequate denture fabrication.  Since he needs extractions and possible alveoloplasty, I recommend he see an oral surgeon for a consult regarding having all surgical procedures completed at the same time under IV sedation or general anesthesia.  I also offered to extract root tips + alveoloplasty here, but explained I would not be able to remove his extra bone due to the size and would subsequently refer him to an oral surgeon for this. . The patient verbalized understanding of all options, and currently wishes to proceed with referral to an oral surgeon for consult regarding extractions, alveoloplasty, Bilateral Mandibular Tori reduction and upper/lower Exostosis removal.  . Discussion of findings with medical team and coordination of future medical and dental care as needed.  . Referral to [facility] sent.  Will follow-up with patient as needed.   The patient tolerated  today's visit well.  All questions/concerns were addressed and the patient departed in stable condition.   I spent in excess of 120 minutes during the conduct of this consultation and >50% of this time involved direct face-to-face encounter for counseling and/or coordination of the patient's care.   Gerlach Benson [place], DMD

## 2020-03-29 ENCOUNTER — Encounter (HOSPITAL_COMMUNITY): Payer: Self-pay | Admitting: Dentistry

## 2020-04-04 ENCOUNTER — Telehealth: Payer: Self-pay

## 2020-04-04 NOTE — Telephone Encounter (Signed)
Received call from [facility] regarding labs  04/03/20: CK:63 Sed rate: 47 Creatinine: 1.6 WBC 5.9 CRP: 9 Hbg: 8.1 (up from last week 7.6)  Patient currently being treated Daptomycin IV 500 mg daily: MRSA PJI right knee with last day of therapy on 05/01/2020 Patient to follow up with Dr. Megan Salon on 04/18/20  Routing to Dr. Megan Salon to make aware of recent labs.

## 2020-04-04 NOTE — Telephone Encounter (Signed)
His lab results look okay.  I would continue daptomycin and have him follow-up with me on 04/18/2020.

## 2020-04-11 NOTE — Telephone Encounter (Signed)
Received call from [facility] PharmD with [facility] to report labs results collected on 04/09/20  RBC 2.45 Hemoglobin 7.8 Platelets 149 Creatinine 1.71 Sed rate 61 CRP 4  Patient currently on Daptomycin  Routing to Dr. Megan Salon to make aware.  Mark [name]

## 2020-04-12 NOTE — Telephone Encounter (Signed)
His creatinine has come down.  I will continue daptomycin for now.

## 2020-04-18 ENCOUNTER — Telehealth: Payer: Self-pay | Admitting: *Deleted

## 2020-04-18 ENCOUNTER — Ambulatory Visit: Payer: Medicare Other | Admitting: Internal Medicine

## 2020-04-18 NOTE — Telephone Encounter (Signed)
I would hold the course with dapto as long as properly dosed based on his GFR. When is his stop date and who is his PCP they could workup his renal problems since should not be due to dapto

## 2020-04-18 NOTE — Telephone Encounter (Signed)
Mark [name] from [facility] calling regarding alert lab results from 12/20.  Creatinine 2.16 (previously 1.7 12/13), WBC 2.8 (3.8 on 12/20).  Please advise. Landis Gandy, RN

## 2020-04-18 NOTE — Telephone Encounter (Signed)
Relayed to Mark [name] at [place]. Patient scheduled to end daptomycin 1/4, when he is also scheduled to see Dr Megan Salon. PCP is [name] at [place]. Jeani Hawking will forward labs to PCP, will do a creatinine clearance calculation to make sure dose is within boundaries as well. Landis Gandy, RN

## 2020-04-19 ENCOUNTER — Telehealth: Payer: Self-pay | Admitting: Pharmacist

## 2020-04-19 NOTE — Telephone Encounter (Signed)
Thanks Cassie 

## 2020-04-19 NOTE — Telephone Encounter (Signed)
Mark [name] called regarding patient's kidney function. His CrCL is <30 indicating the need to go q48h with his daptomycin. I agreed and gave verbal orders to change. He sent orders over to his PCP as well. Not sure why his kidneys are declining but will defer to his PCP as it isn't the daptomycin. Will let Dr. Tommy Medal know.

## 2020-04-28 DIAGNOSIS — R7881 Bacteremia: Secondary | ICD-10-CM | POA: Diagnosis not present

## 2020-04-28 DIAGNOSIS — T8454XA Infection and inflammatory reaction due to internal left knee prosthesis, initial encounter: Secondary | ICD-10-CM | POA: Diagnosis not present

## 2020-05-01 ENCOUNTER — Telehealth: Payer: Self-pay

## 2020-05-01 ENCOUNTER — Ambulatory Visit: Payer: Medicare Other | Admitting: Internal Medicine

## 2020-05-01 DIAGNOSIS — B9562 Methicillin resistant Staphylococcus aureus infection as the cause of diseases classified elsewhere: Secondary | ICD-10-CM | POA: Diagnosis not present

## 2020-05-01 DIAGNOSIS — T8454XA Infection and inflammatory reaction due to internal left knee prosthesis, initial encounter: Secondary | ICD-10-CM | POA: Diagnosis not present

## 2020-05-01 DIAGNOSIS — R7881 Bacteremia: Secondary | ICD-10-CM | POA: Diagnosis not present

## 2020-05-01 NOTE — Telephone Encounter (Addendum)
Per MD called [facility] with orders to hold patient's Daptomycin and maintain picc until further orders. Spoke with Corrie Dandy who states last dose is today. Patient has double lumen picc that is not sutured. Will have RN maintain picc. Rescheduled appointment for patient to 1/6 at 11. Patient is okay with appointment.  Lorenso Courier, [place]

## 2020-05-03 ENCOUNTER — Ambulatory Visit (INDEPENDENT_AMBULATORY_CARE_PROVIDER_SITE_OTHER): Payer: Medicare Other | Admitting: Internal Medicine

## 2020-05-03 ENCOUNTER — Other Ambulatory Visit: Payer: Self-pay

## 2020-05-03 ENCOUNTER — Encounter: Payer: Self-pay | Admitting: Internal Medicine

## 2020-05-03 DIAGNOSIS — T8454XA Infection and inflammatory reaction due to internal left knee prosthesis, initial encounter: Secondary | ICD-10-CM | POA: Diagnosis not present

## 2020-05-03 DIAGNOSIS — T8453XD Infection and inflammatory reaction due to internal right knee prosthesis, subsequent encounter: Secondary | ICD-10-CM | POA: Diagnosis not present

## 2020-05-03 DIAGNOSIS — B9562 Methicillin resistant Staphylococcus aureus infection as the cause of diseases classified elsewhere: Secondary | ICD-10-CM | POA: Diagnosis not present

## 2020-05-03 DIAGNOSIS — Z8614 Personal history of Methicillin resistant Staphylococcus aureus infection: Secondary | ICD-10-CM | POA: Diagnosis not present

## 2020-05-03 DIAGNOSIS — K7460 Unspecified cirrhosis of liver: Secondary | ICD-10-CM

## 2020-05-03 DIAGNOSIS — R7881 Bacteremia: Secondary | ICD-10-CM | POA: Diagnosis not present

## 2020-05-03 MED ORDER — DOXYCYCLINE HYCLATE 100 MG PO TABS: 100.0000 mg | ORAL_TABLET | Freq: Two times a day (BID) | ORAL | 11 refills | Status: DC

## 2020-05-03 NOTE — Assessment & Plan Note (Signed)
Mark [name] is doing better following resection arthroplasty for MRSA prosthetic knee infection.  His inflammatory markers remain elevated but these are difficult to interpret given his underlying cirrhosis.  I will stop his daptomycin, have his PICC removed and changed him to oral doxycycline.  I believe that he will need to be on lifelong doxycycline given his history of recurrent, persistent infections.  He will follow-up in 6 weeks.

## 2020-05-03 NOTE — Progress Notes (Signed)
[facility] for Infectious Disease  Patient Active Problem List   Diagnosis Date Noted  . MRSA bacteremia 09/21/2018    Priority: High  . Infection of prosthetic right knee joint ([place]) 09/20/2018    Priority: High  . Epidural abscess 09/20/2018    Priority: High  . S/P right TKA 05/02/2013    Priority: High  . Infection of total left knee replacement ([place]) 03/20/2020  . Cirrhosis of liver without ascites ([place])   . AKI (acute kidney injury) ([place])   . Bacteremia   . Spinal stenosis of lumbar region without neurogenic claudication   . Aspiration pneumonia ([place]) 09/24/2018  . Abscess in epidural space of lumbar spine   . Anemia of chronic disease 09/20/2018  . Hypoalbuminemia 09/20/2018  . Hypoglycemia without diagnosis of diabetes mellitus 09/20/2018  . Hyperkalemia 05/18/2018  . Edentulous 05/11/2018  . Pancytopenia ([place]) 05/10/2018  . CAD (coronary artery disease) 05/10/2018  . Hepatic encephalopathy ([place]) 05/10/2018  . Alcohol abuse 05/10/2018  . GERD (gastroesophageal reflux disease) 05/10/2018  . Duodenal ulcer   . Renal failure   . Acute on chronic anemia   . Hypotension 06/17/2017  . Hyponatremia 06/17/2017  . Thrombocytopenia ([place]) 06/17/2017  . Leg edema, right 06/17/2017  . Acute metabolic encephalopathy [id number]  . Anemia, iron deficiency   . Benign neoplasm of ascending colon   . Hemorrhoids   . Portal hypertensive gastropathy ([place])   . Gastritis and gastroduodenitis   . Esophageal varices without bleeding ([place])   . H/O: CVA (cerebrovascular accident) 12/01/2013  . ACS (acute coronary syndrome) ([place]) 12/01/2013  . Anasarca 12/01/2013  . Polysubstance abuse ([place]) 12/01/2013  . CKD (chronic kidney disease) stage 3, GFR 30-59 ml/min ([name]) 12/01/2013  . Murmur 04/14/2013  . Right inguinal hernia 12/26/2010  . Cirrhosis, alcoholic ([place]) [id number]    Patient's Medications  New Prescriptions   DOXYCYCLINE (VIBRA-TABS) 100 MG TABLET    Take  1 tablet (100 mg total) by mouth 2 (two) times daily.  Previous Medications   DOCUSATE SODIUM (COLACE) 100 MG CAPSULE    Take 1 capsule (100 mg total) by mouth 2 (two) times daily.   FERROUS SULFATE (FERROUSUL) 325 (65 FE) MG TABLET    Take 1 tablet (325 mg total) by mouth 3 (three) times daily with meals for 14 days.   FOLIC ACID (FOLVITE) 1 MG TABLET    TAKE 1 TABLET(1 MG) BY MOUTH DAILY   FUROSEMIDE (LASIX) 20 MG TABLET    TAKE 1 TABLET(20 MG) BY MOUTH TWICE DAILY   HYDROCODONE-ACETAMINOPHEN (NORCO) 7.5-325 MG TABLET    Take 1-2 tablets by mouth every 4 (four) hours as needed for moderate pain.   LACTULOSE (CHRONULAC) 10 GM/15ML SOLUTION    TAKE 45 MILLILITERS BY MOUTH TWICE DAILY   METHOCARBAMOL (ROBAXIN) 500 MG TABLET    Take 1 tablet (500 mg total) by mouth every 6 (six) hours as needed for muscle spasms.   NADOLOL (CORGARD) 20 MG TABLET    TAKE 1 TABLET BY MOUTH EVERY DAY   PANTOPRAZOLE (PROTONIX) 40 MG TABLET    Take 1 tablet (40 mg total) by mouth daily.   POLYETHYLENE GLYCOL (MIRALAX / GLYCOLAX) 17 G PACKET    Take 17 g by mouth 2 (two) times daily.  Modified Medications   No medications on file  Discontinued Medications   No medications on file    Subjective: Mark [name] is in for Mark [name] routine follow-up visit.  Mark [name] has  had recurrent bacteremia.  Mark [name] had group A strep bacteremia in February 2019, MSSA bacteremia in January 2020 and MRSA bacteremia in May 2020.  Mark [name] last bacteremia was complicated by thoracic spine infection.  Mark [name] has been taking chronic doxycycline ever since that hospitalization.  However, Mark [name] stopped taking it sometime in the past 2 months.  Mark [name] says Mark [name] stopped and Mark [name] interrupts him and says Mark [name] ran out.  Mark [name] is not exactly sure when Mark [name] stopped it.  Mark [name] has not had any return of back pain but Mark [name] began having increased pain, swelling and "popping" in Mark [name] prosthetic right knee.  Mark [name] thinks that this started after Mark [name] quit doxycycline.  Mark [name] cannot recall who replaced Mark [name] knee about  8 years ago.  Mark [name] recently saw Dr. Lindwood Qua who told him that the prosthesis was infected and loose.  Mark [name] said that fluid was drained off of the knee.  It was reported the cultures grew MRSA susceptible to doxycycline and daptomycin.  Mark [name] was admitted to the hospital in late November and underwent resection arthroplasty and spacer placement.  No operative cultures were obtained.  Mark [name] has chronic renal insufficiency.  My partner discharged him on IV daptomycin and Mark [name] completed 8 weeks of therapy on 05/02/2019.  Mark [name] denies any problems tolerating Mark [name] daptomycin or PICC.  Mark [name] says that Mark [name] knee is feeling better.  Mark [name] is now only taking Naprosyn as needed.  Mark [name] denies any fever, chills, sweats, nausea, vomiting or diarrhea. Review of Systems: Review of Systems  Constitutional: Negative for chills, diaphoresis, fever and weight loss.  Gastrointestinal: Negative for abdominal pain, diarrhea, nausea and vomiting.  Musculoskeletal: Positive for joint pain. Negative for back pain.    Past Medical History:  Diagnosis Date  . Alcohol abuse   . Anemia   . Arthritis   . Ascites   . Cirrhosis ([place])   . Coffee ground emesis   . Dehydration 06/17/2017  . Febrile illness   . Heart murmur   . History of blood transfusion   . Hyperlipidemia   . Hypertension   . Leg swelling   . Myocardial infarction ([place]) 2012  . Preop cardiovascular exam 04/14/2013  . Sepsis ([place]) 06/17/2017  . Septic shock ([place]) 06/18/2017  . SIRS (systemic inflammatory response syndrome) ([place]) 07/11/2017  . Stroke [facility]) 2012   no deficits  . Thrombocytopenia ([name])     Social History   Tobacco Use  . Smoking status: Former Smoker    Packs/day: 0.50    Years: 10.00    Pack years: 5.00    Types: Cigarettes    Quit date: 01/05/2016    Years since quitting: 4.3  . Smokeless tobacco: Never Used  . Tobacco comment: 4 cigarettes a day  Vaping Use  . Vaping Use: Never used  Substance Use Topics  . Alcohol use: Yes    Comment: very  little  . Drug use: Not Currently    Types: Marijuana, Heroin    Family History  Problem Relation Age of Onset  . Hypertension Father   . Diabetes Father   . Dementia Mother   . Lupus Sister     No Known Allergies  Objective: Vitals:   05/03/20 1046  BP: 121/68  Pulse: 94  Temp: 97.6 F (36.4 C)  TempSrc: Oral  Weight: 129 lb (58.5 kg)   Body mass index is 20.2 kg/m.  Physical Exam Constitutional:      Comments: Mark [name] and Mark [name] remain confused about recent events.  Mark [name] appears comfortable and in no distress.  Cardiovascular:     Rate and Rhythm: Normal rate and regular rhythm.     Heart sounds: Murmur heard.      Comments: 2/6 systolic murmur at the right upper sternal border. Pulmonary:     Effort: Pulmonary effort is normal.     Breath sounds: Normal breath sounds.  Musculoskeletal:     Comments: Mark [name] has healed incisions on Mark [name] right knee.  There is slight diffuse swelling with only slight warmth and no redness.     Lab Results  Sed rate 04/23/2020 88 C-reactive protein 04/23/2020 21  Sed Rate  Date Value  03/21/2020 90 mm/hr (H)  02/02/2020 129 mm/h (H)  07/08/2019 CANCELED   CRP  Date Value  03/21/2020 0.6 mg/dL  14/97/0263 78.5 mg/L (H)  06/30/2019 24.0 mg/L (H)     Problem List Items Addressed This Visit      High   Infection of prosthetic right knee joint ([name])    Mark [name] is doing better following resection arthroplasty for MRSA prosthetic knee infection.  Mark [name] inflammatory markers remain elevated but these are difficult to interpret given Mark [name] underlying cirrhosis.  I will stop Mark [name] daptomycin, have Mark [name] PICC removed and changed him to oral doxycycline.  I believe that Mark [name] will need to be on lifelong doxycycline given Mark [name] history of recurrent, persistent infections.  Mark [name] will follow-up in 6 weeks.      Relevant Medications   doxycycline (VIBRA-TABS) 100 MG tablet       Cliffton Asters, MD [facility] for Infectious Disease [facility] [phone] pager   [phone] cell 05/03/2020, 11:09 AM

## 2020-05-03 NOTE — Telephone Encounter (Signed)
Per MD called [facility] with verbal order to pull picc. Spoke with Eunice Blase who will update nursing. Orders were repeated and confirmed before ending call. Lorenso Courier, [place]

## 2020-05-08 ENCOUNTER — Ambulatory Visit: Payer: Medicare Other | Admitting: Internal Medicine

## 2020-05-18 ENCOUNTER — Other Ambulatory Visit: Payer: Self-pay | Admitting: Family Medicine

## 2020-05-25 ENCOUNTER — Other Ambulatory Visit: Payer: Self-pay | Admitting: Family Medicine

## 2020-05-30 DIAGNOSIS — Z471 Aftercare following joint replacement surgery: Secondary | ICD-10-CM | POA: Diagnosis not present

## 2020-05-30 DIAGNOSIS — M25461 Effusion, right knee: Secondary | ICD-10-CM | POA: Diagnosis not present

## 2020-05-30 DIAGNOSIS — Z96651 Presence of right artificial knee joint: Secondary | ICD-10-CM | POA: Diagnosis not present

## 2020-06-01 IMAGING — CT CT HEAD W/O CM
4 series · 17 of 47 positions shown, 19 images · non-contrast
Comparison: MR brain dated 11/02/2009

CLINICAL DATA: Altered mental status/confusion

EXAM:
CT HEAD WITHOUT CONTRAST
TECHNIQUE: Contiguous axial images were obtained from the base of the skull
through the vertex without intravenous contrast.

[Series 3: head wo · axial · 0.43mm/px · z∈[-138,-18]mm · 7 of 33 slices shown, 9 images]
[im 5/33  brain]
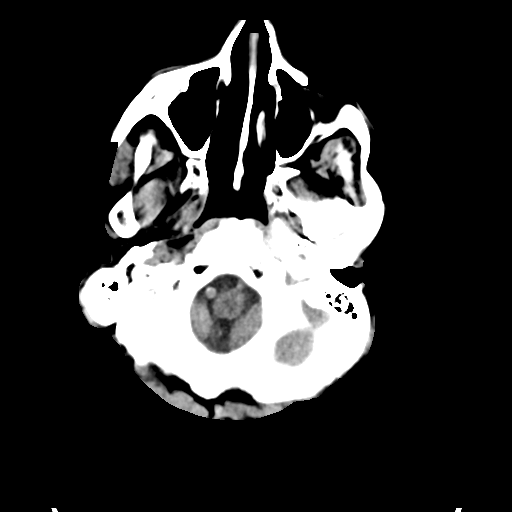
[im 5/33  bone]
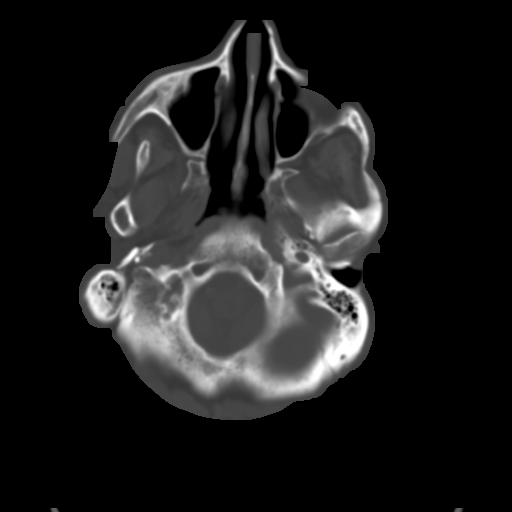
[im 9/33  brain]
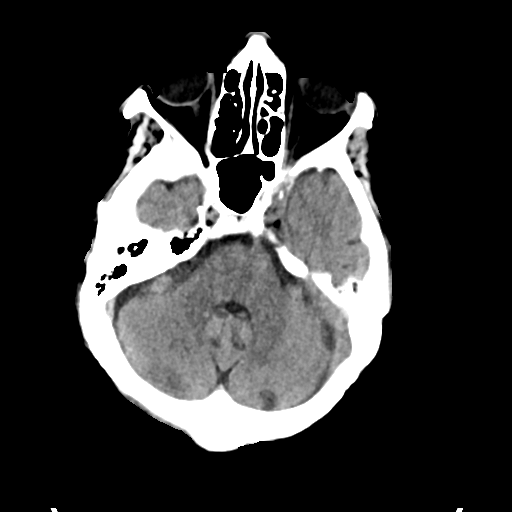
[im 13/33  brain]
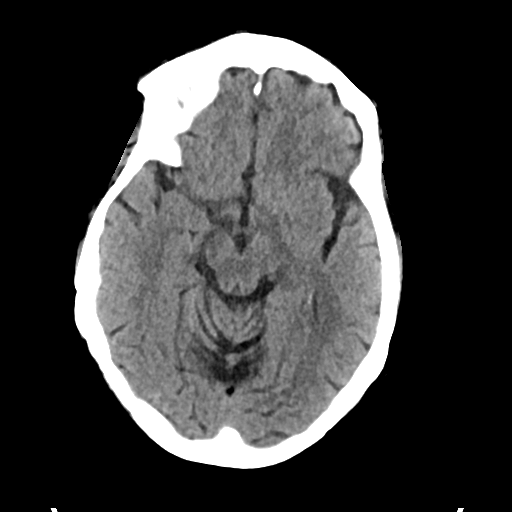
[im 17/33  brain]
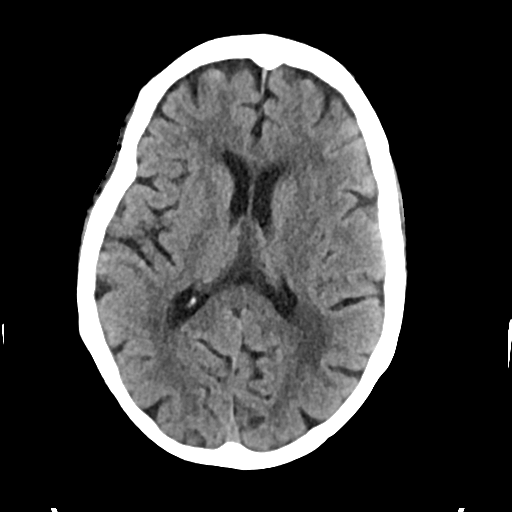
[im 21/33  brain]
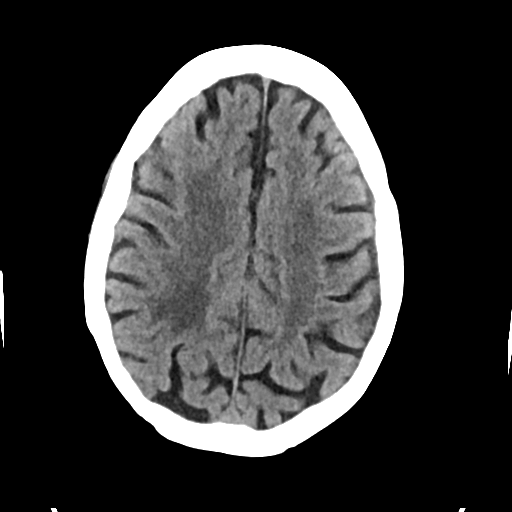
[im 21/33  bone]
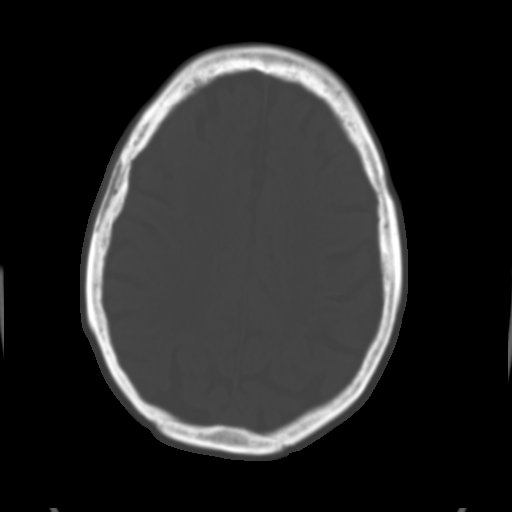
[im 25/33  brain]
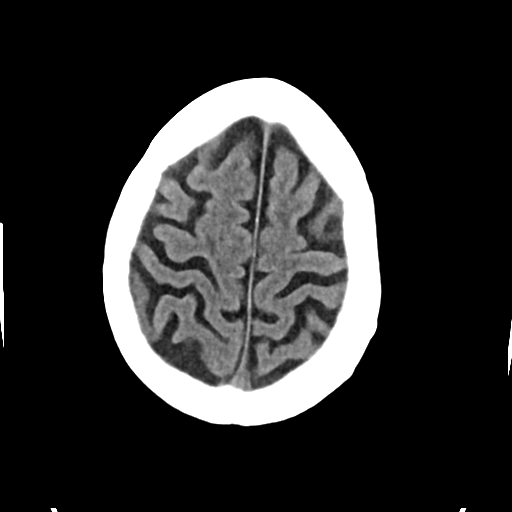
[im 29/33  brain]
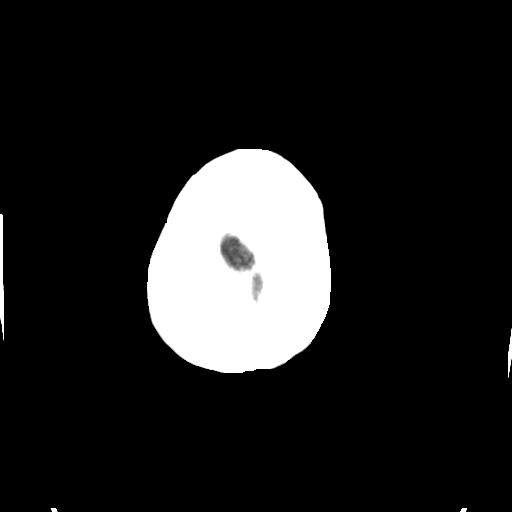

[Series 4: head bone · axial · 0.43mm/px · z∈[-142,-86]mm · 4 of 81 slices shown]
[im 9/81  bone]
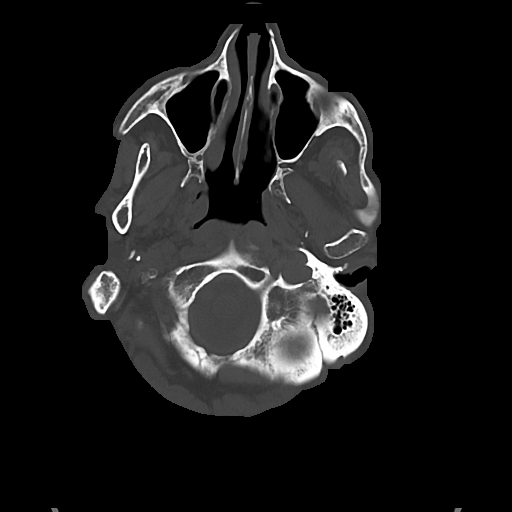
[im 17/81  bone]
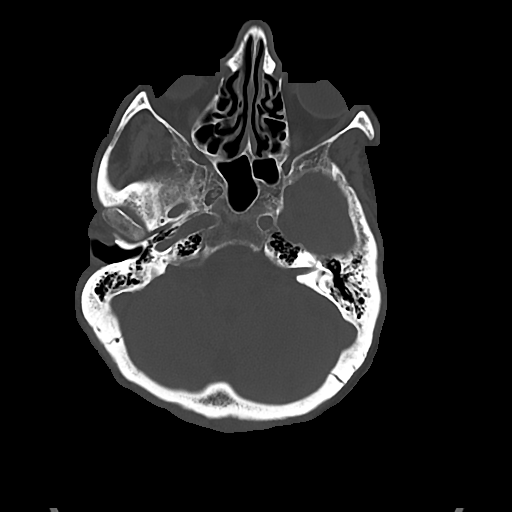
[im 25/81  bone]
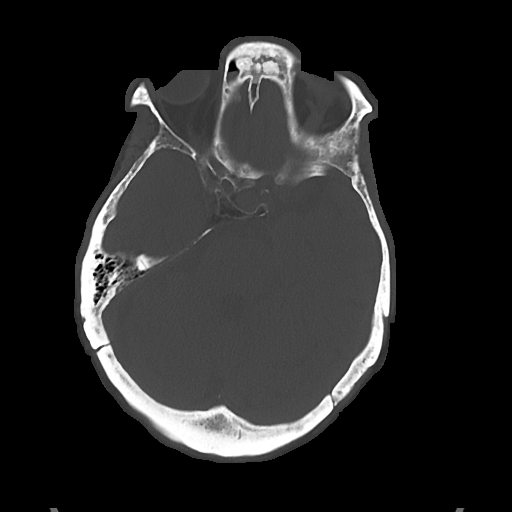
[im 37/81  bone]
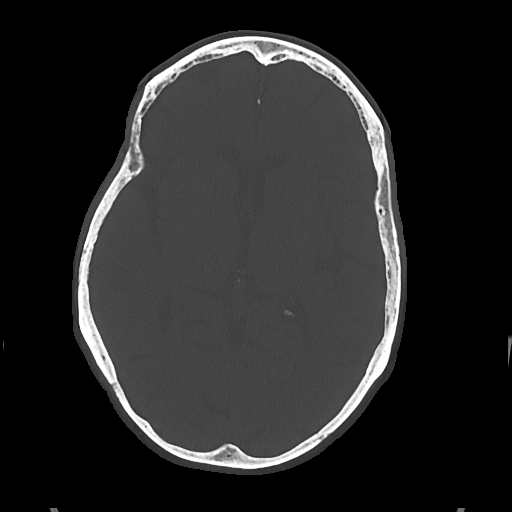

[Series 5: cor soft · coronal · 0.31mm/px · 3 of 67 slices shown]
[im 23/67  brain]
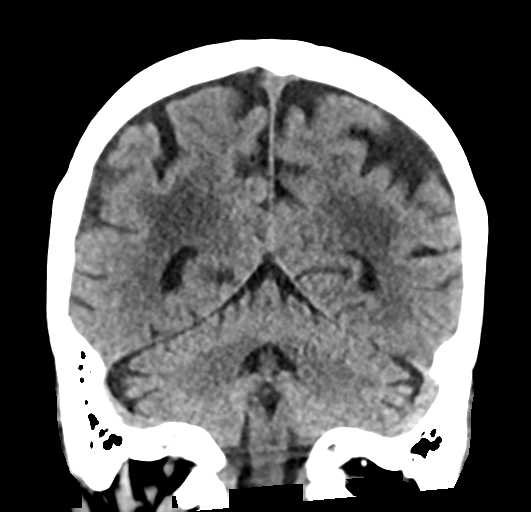
[im 30/67  brain]
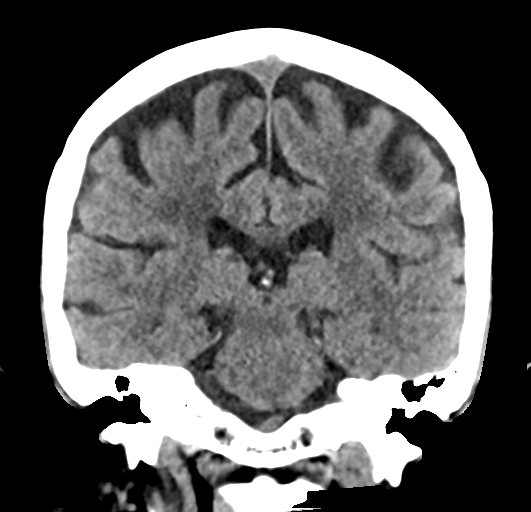
[im 37/67  brain]
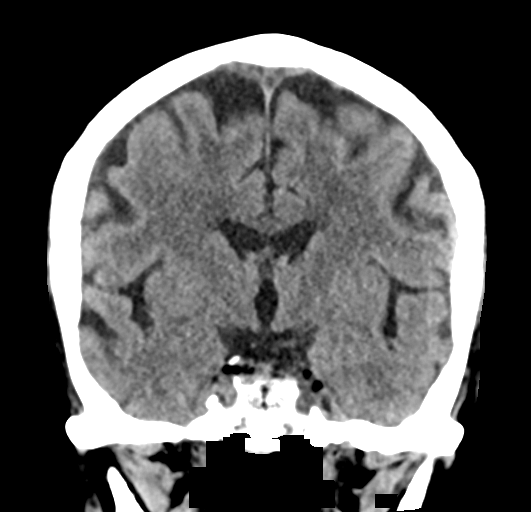

[Series 6: sag soft · sagittal · 0.31mm/px · 3 of 58 slices shown]
[im 20/58  brain]
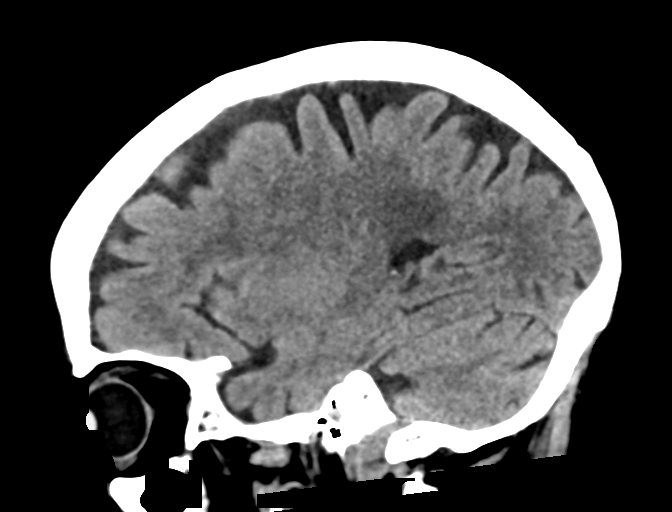
[im 29/58  brain]
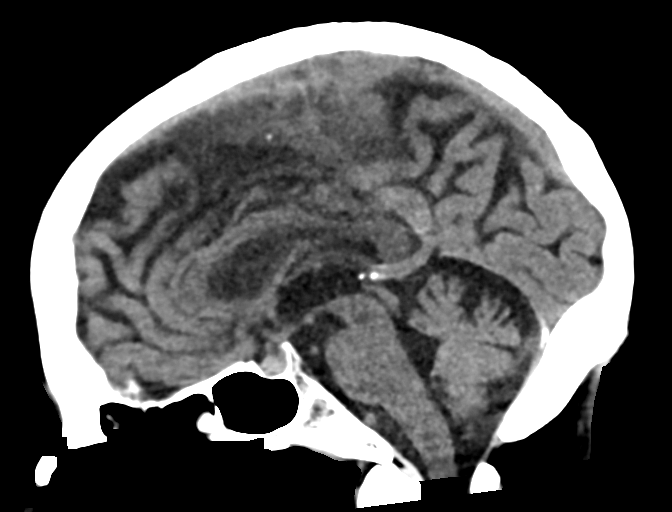
[im 39/58  brain]
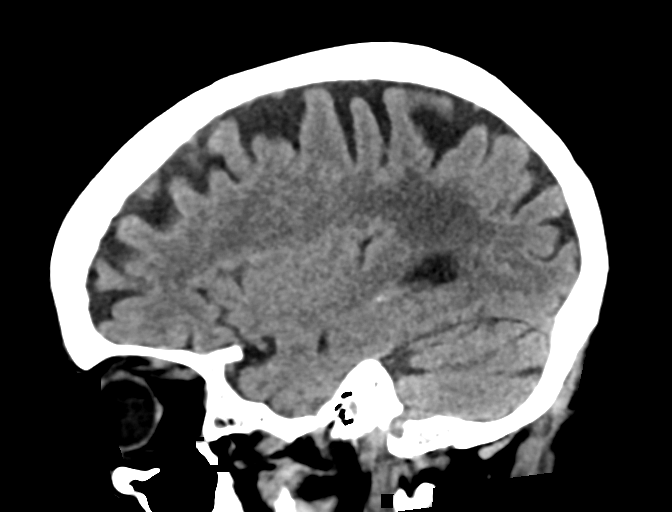

[17 of 47 positions shown; findings below may reference images not displayed]

FINDINGS: Brain: No evidence of acute infarction, hemorrhage, hydrocephalus,
extra-axial collection or mass lesion/mass effect.

Subcortical white matter and periventricular small vessel ischemic
changes.

Vascular: No hyperdense vessel or unexpected calcification.

Skull: Normal. Negative for fracture or focal lesion.

Sinuses/Orbits: The visualized paranasal sinuses are essentially
clear. The mastoid air cells are unopacified.

Other: None.
IMPRESSION: No evidence of acute intracranial abnormality.

Small vessel ischemic changes.

## 2020-06-01 IMAGING — DX DG CHEST 1V PORT
1 series · 1 of 1 positions shown · non-contrast
Comparison: 07/10/2017

CLINICAL DATA: Mental status changes.

EXAM:
PORTABLE CHEST 1 VIEW

[chest]
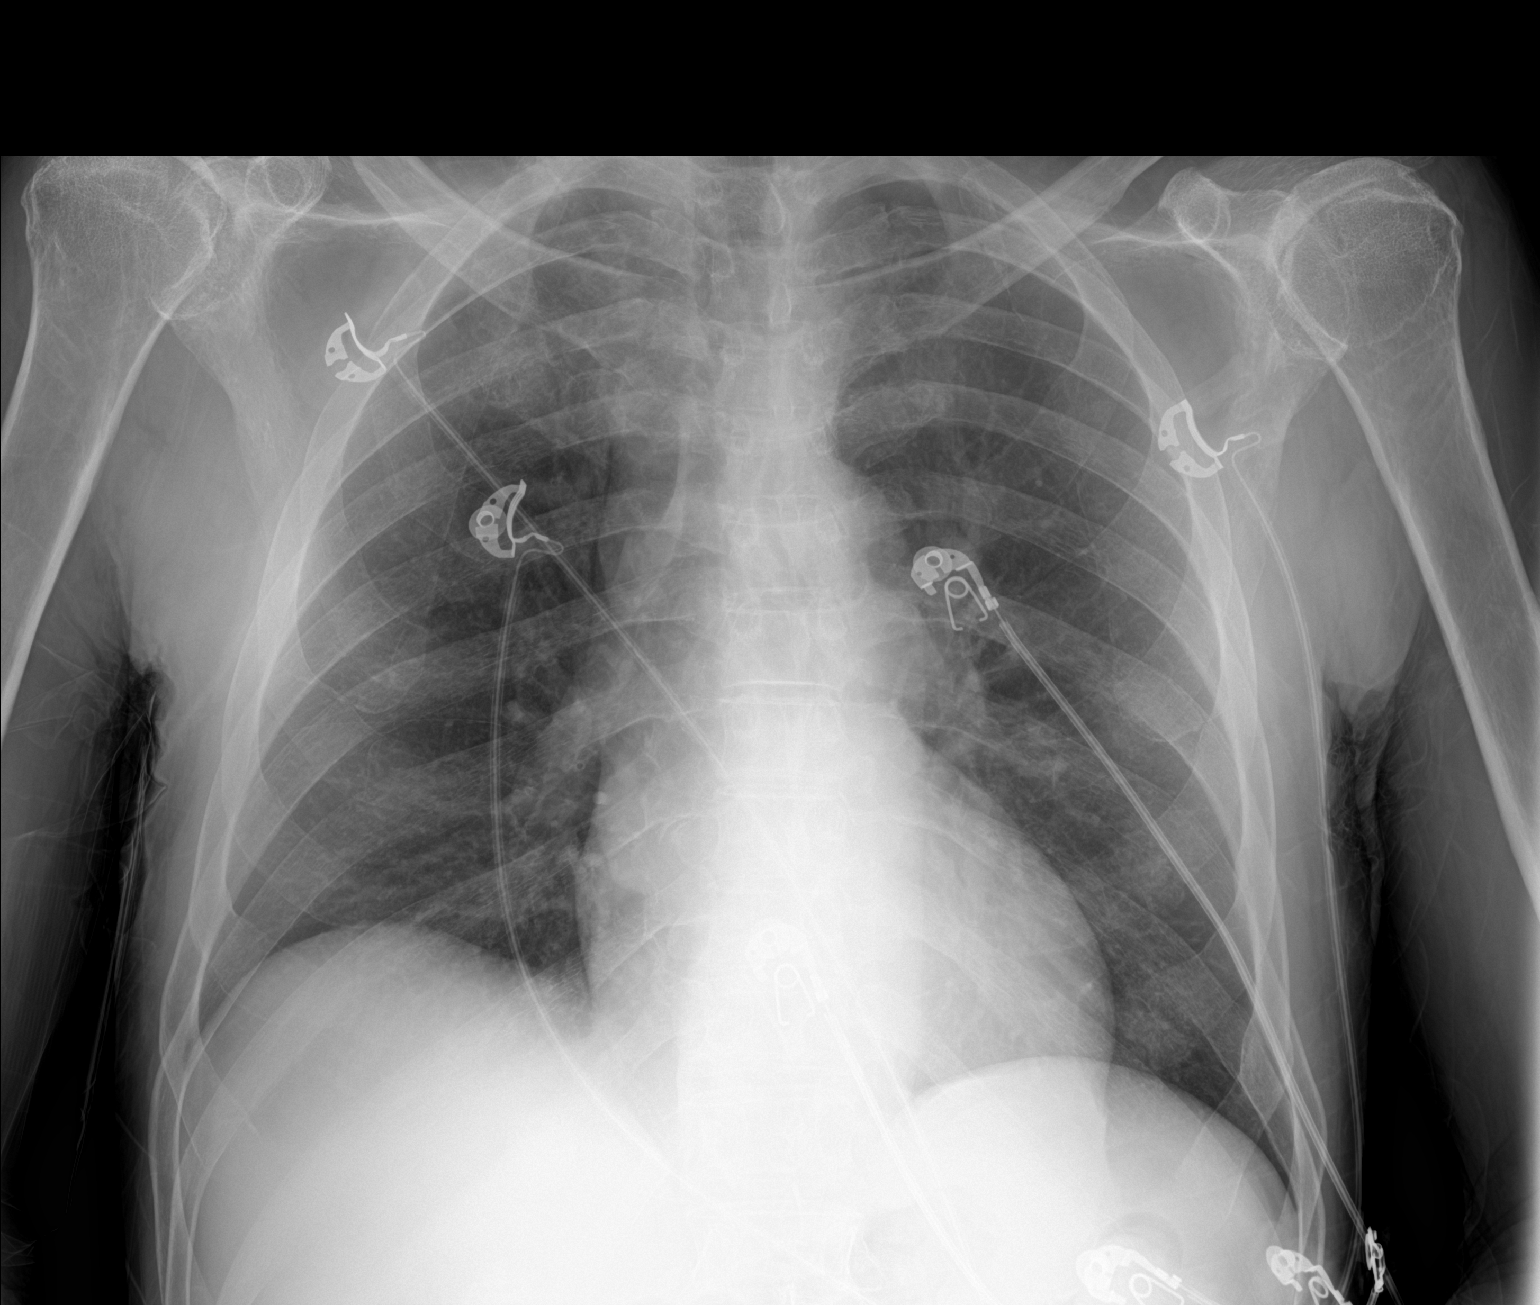

[1 of 1 positions shown; findings below may reference images not displayed]

FINDINGS: Normal heart size and pulmonary vascularity. No focal airspace
disease or consolidation in the lungs. No blunting of costophrenic
angles. No pneumothorax. Mediastinal contours appear intact.
Degenerative changes in the shoulders. Old left rib fractures.
IMPRESSION: No active disease.

## 2020-06-09 IMAGING — CR DG CHEST 1V
1 series · 1 of 1 positions shown · non-contrast
Comparison: 05/10/2018 and earlier.

CLINICAL DATA: 62-year-old male with altered mental status,
combative.

EXAM:
CHEST  1 VIEW

[chest ap]
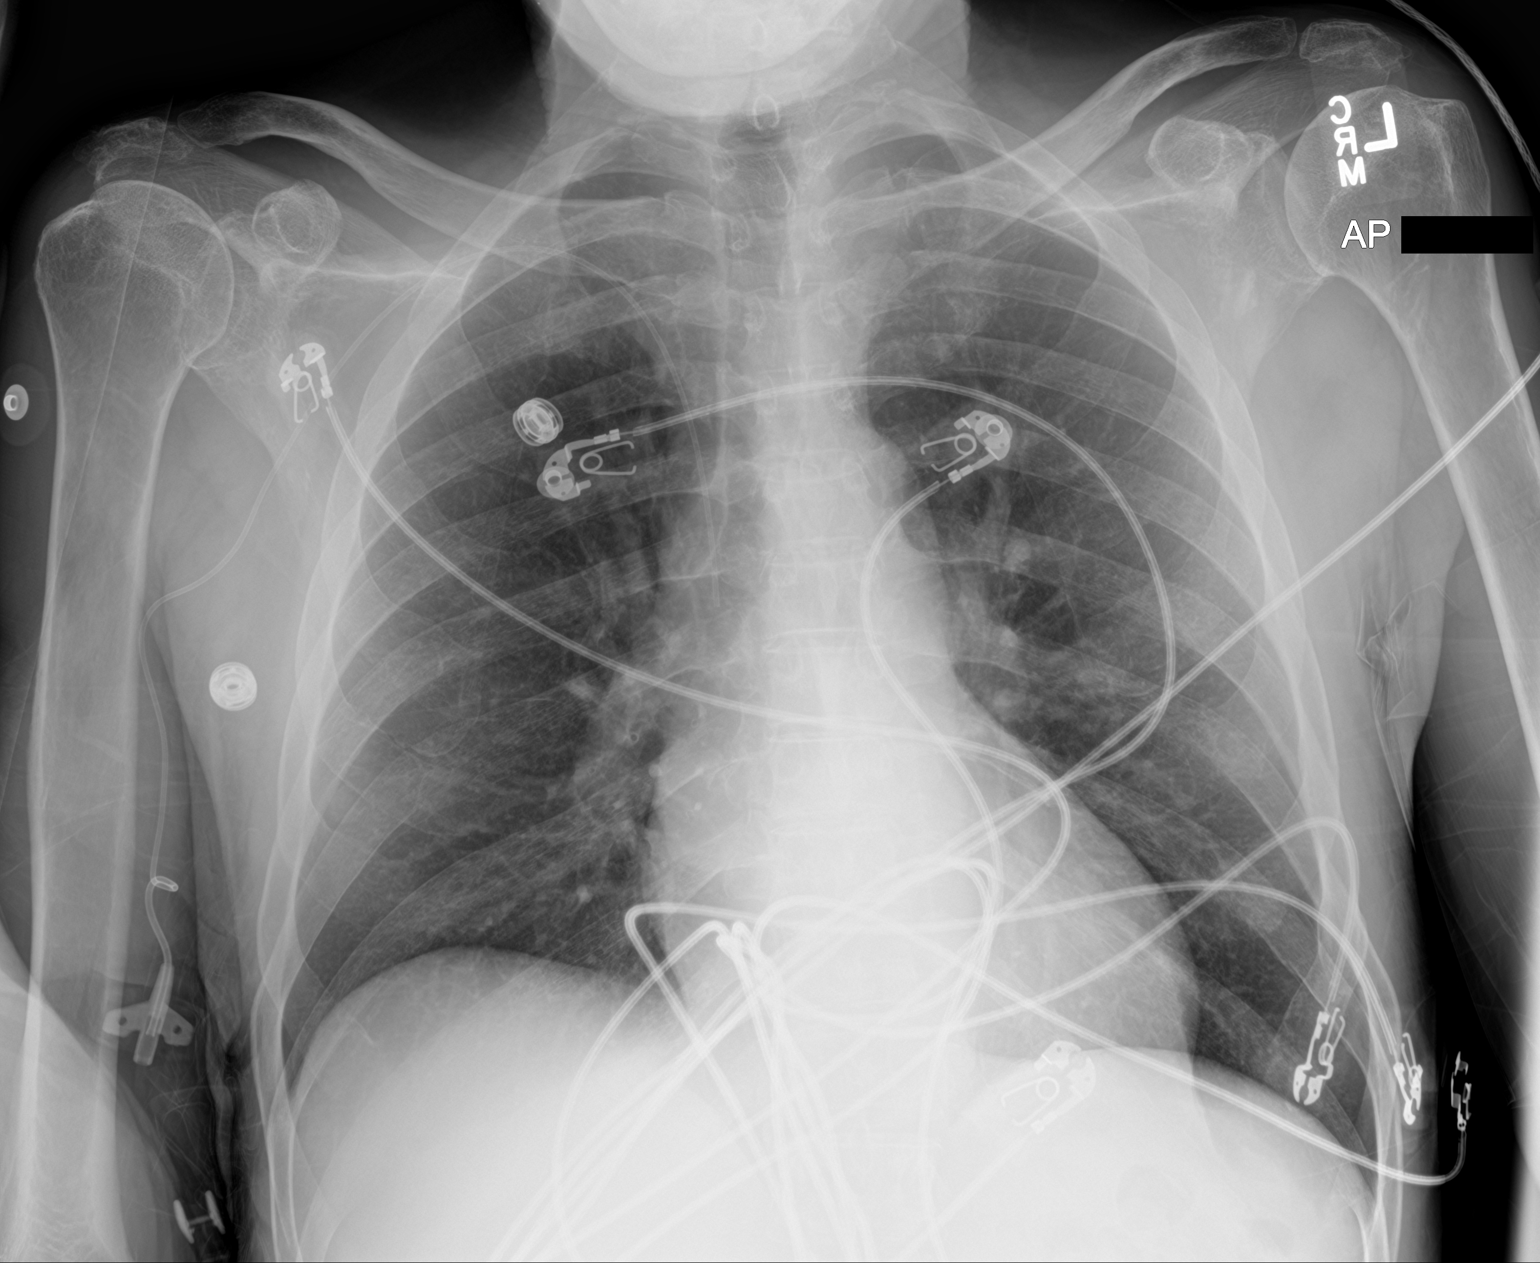

[1 of 1 positions shown; findings below may reference images not displayed]

FINDINGS: Portable AP upright view at 1143 hours. Right PICC line is in place,
tip at the lower SVC level. Normal cardiac size and mediastinal
contours. Visualized tracheal air column is within normal limits.
Allowing for portable technique the lungs are clear. No
pneumothorax. No acute osseous abnormality identified.
IMPRESSION: No cardiopulmonary abnormality.  Right PICC line in place.

## 2020-06-10 IMAGING — DX DG ABD PORTABLE 1V
1 series · 2 of 2 positions shown · non-contrast
Comparison: None.

CLINICAL DATA: Evaluate NG tube

EXAM:
PORTABLE ABDOMEN - 1 VIEW

[Series 1: abdomen · 0.14mm/px · 2 of 2 slices shown]
[im 1/2]
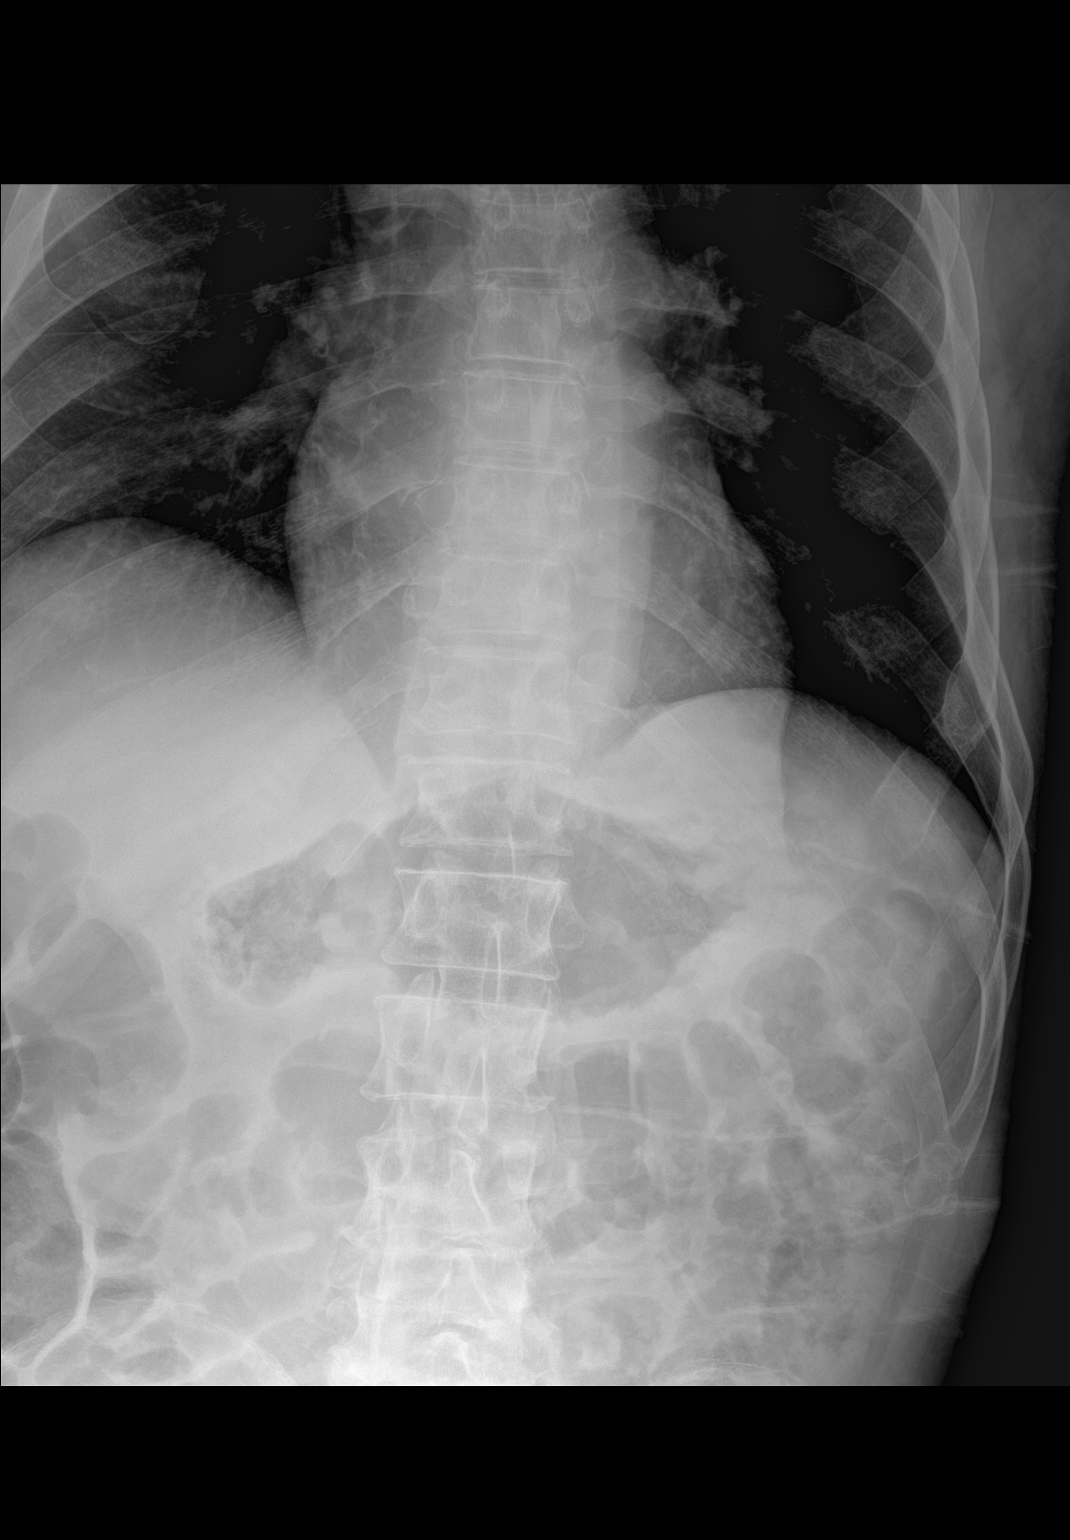
[im 2/2]
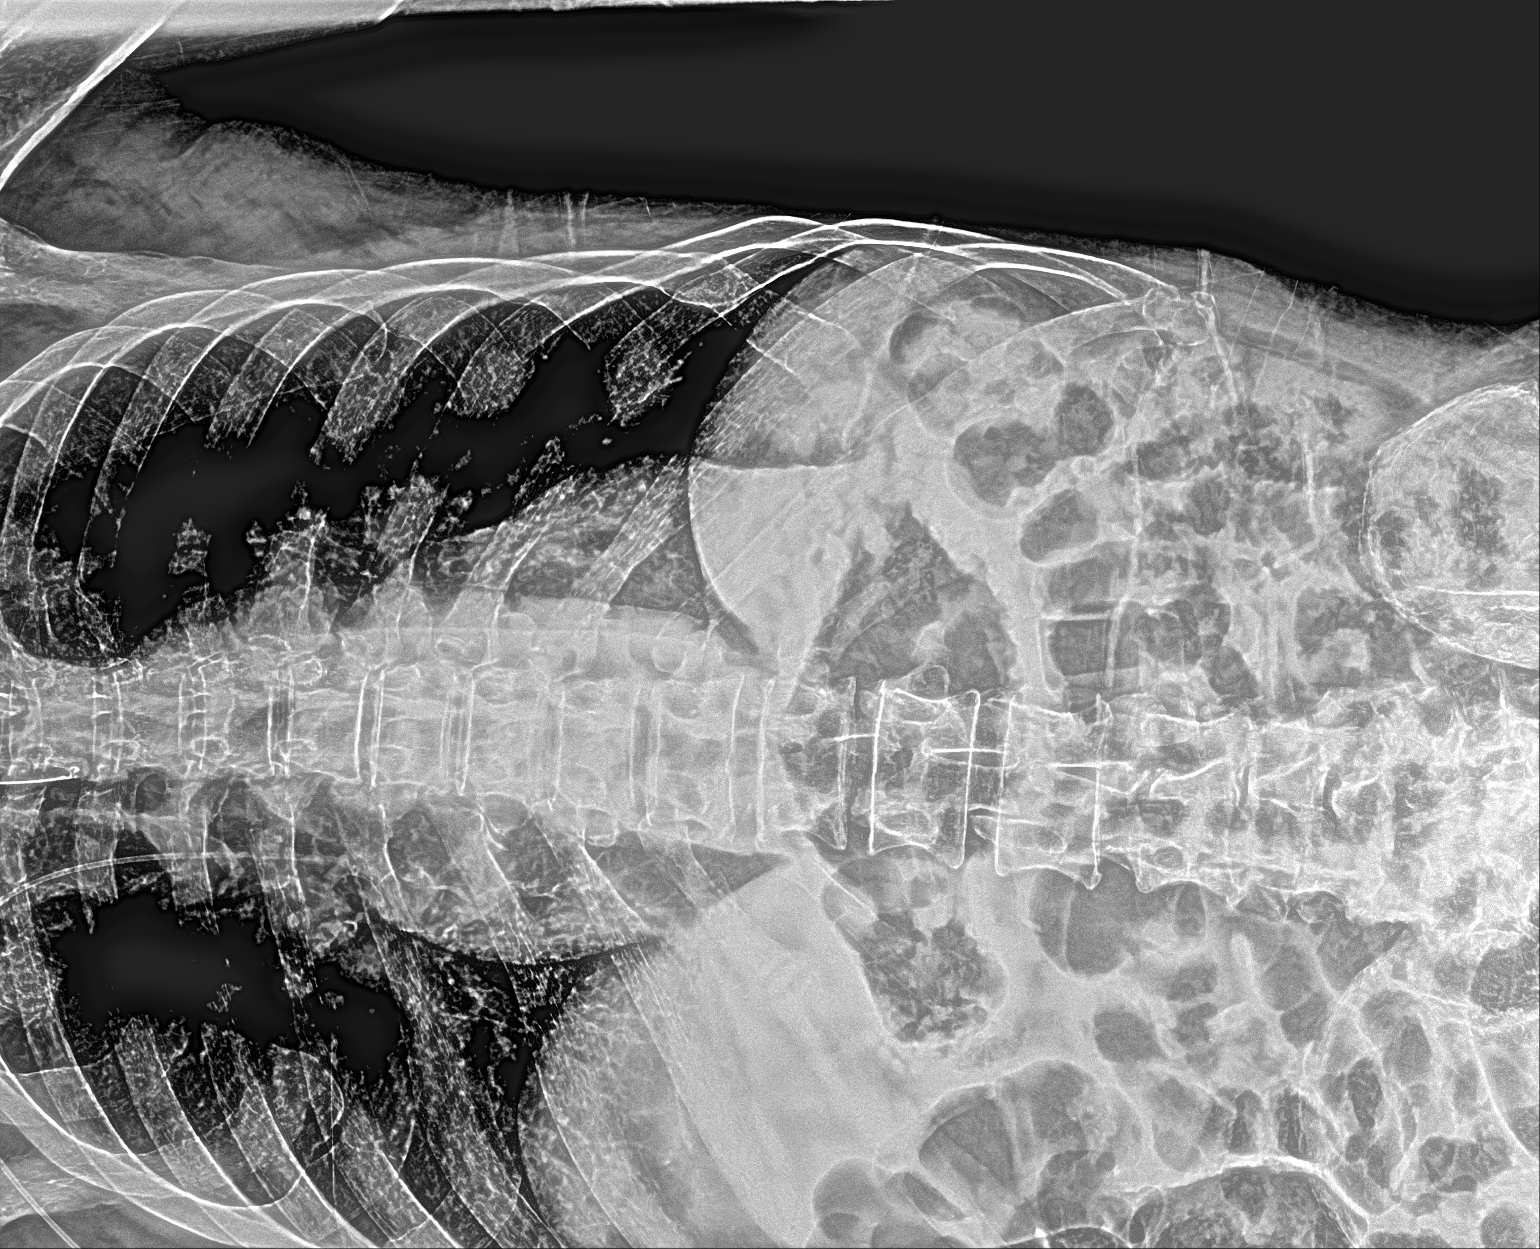

[2 of 2 positions shown; findings below may reference images not displayed]

FINDINGS: The NG tube terminates over the upper chest. It projects over the
left lateral aspect of the trachea. It is unclear whether this tube
is in the airway or the upper esophagus. No other acute
abnormalities.
IMPRESSION: 1. The NG tube terminates over the upper chest, either within the
trachea or upper esophagus.

Findings called to the patient's nurse, [place]. By report, the NG
tube has been removed.

## 2020-06-12 ENCOUNTER — Encounter: Payer: Self-pay | Admitting: Internal Medicine

## 2020-06-12 ENCOUNTER — Ambulatory Visit (INDEPENDENT_AMBULATORY_CARE_PROVIDER_SITE_OTHER): Payer: BC Managed Care – PPO | Admitting: Internal Medicine

## 2020-06-12 ENCOUNTER — Other Ambulatory Visit: Payer: Self-pay

## 2020-06-12 DIAGNOSIS — T8453XD Infection and inflammatory reaction due to internal right knee prosthesis, subsequent encounter: Secondary | ICD-10-CM | POA: Diagnosis not present

## 2020-06-12 NOTE — Progress Notes (Signed)
[facility] for Infectious Disease  Patient Active Problem List   Diagnosis Date Noted  . MRSA bacteremia 09/21/2018    Priority: High  . Infection of prosthetic right knee joint ([place]) 09/20/2018    Priority: High  . Epidural abscess 09/20/2018    Priority: High  . S/P right TKA 05/02/2013    Priority: High  . Infection of total left knee replacement ([place]) 03/20/2020  . Cirrhosis of liver without ascites ([place])   . AKI (acute kidney injury) ([place])   . Bacteremia   . Spinal stenosis of lumbar region without neurogenic claudication   . Aspiration pneumonia ([place]) 09/24/2018  . Abscess in epidural space of lumbar spine   . Anemia of chronic disease 09/20/2018  . Hypoalbuminemia 09/20/2018  . Hypoglycemia without diagnosis of diabetes mellitus 09/20/2018  . Hyperkalemia 05/18/2018  . Edentulous 05/11/2018  . Pancytopenia ([place]) 05/10/2018  . CAD (coronary artery disease) 05/10/2018  . Hepatic encephalopathy ([place]) 05/10/2018  . Alcohol abuse 05/10/2018  . GERD (gastroesophageal reflux disease) 05/10/2018  . Duodenal ulcer   . Renal failure   . Acute on chronic anemia   . Hypotension 06/17/2017  . Hyponatremia 06/17/2017  . Thrombocytopenia ([place]) 06/17/2017  . Leg edema, right 06/17/2017  . Acute metabolic encephalopathy 58/12/9831  . Anemia, iron deficiency   . Benign neoplasm of ascending colon   . Hemorrhoids   . Portal hypertensive gastropathy ([place])   . Gastritis and gastroduodenitis   . Esophageal varices without bleeding ([name])   . H/O: CVA (cerebrovascular accident) 12/01/2013  . ACS (acute coronary syndrome) ([place]) 12/01/2013  . Anasarca 12/01/2013  . Polysubstance abuse ([name]) 12/01/2013  . CKD (chronic kidney disease) stage 3, GFR 30-59 ml/min ([name]) 12/01/2013  . Murmur 04/14/2013  . Right inguinal hernia 12/26/2010  . Cirrhosis, alcoholic ([place]) 82/50/5397    Patient's Medications  New Prescriptions   No medications on file  Previous Medications    DOCUSATE SODIUM (COLACE) 100 MG CAPSULE    Take 1 capsule (100 mg total) by mouth 2 (two) times daily.   DOXYCYCLINE (VIBRA-TABS) 100 MG TABLET    Take 1 tablet (100 mg total) by mouth 2 (two) times daily.   FERROUS SULFATE (FERROUSUL) 325 (65 FE) MG TABLET    Take 1 tablet (325 mg total) by mouth 3 (three) times daily with meals for 14 days.   FOLIC ACID (FOLVITE) 1 MG TABLET    TAKE 1 TABLET(1 MG) BY MOUTH DAILY   FUROSEMIDE (LASIX) 20 MG TABLET    TAKE 1 TABLET(20 MG) BY MOUTH TWICE DAILY   HYDROCODONE-ACETAMINOPHEN (NORCO) 7.5-325 MG TABLET    Take 1-2 tablets by mouth every 4 (four) hours as needed for moderate pain.   LACTULOSE (CHRONULAC) 10 GM/15ML SOLUTION    TAKE 45 MILLILITERS BY MOUTH TWICE DAILY   METHOCARBAMOL (ROBAXIN) 500 MG TABLET    Take 1 tablet (500 mg total) by mouth every 6 (six) hours as needed for muscle spasms.   NADOLOL (CORGARD) 20 MG TABLET    TAKE 1 TABLET BY MOUTH EVERY DAY   PANTOPRAZOLE (PROTONIX) 40 MG TABLET    TAKE 1 TABLET(40 MG) BY MOUTH DAILY   POLYETHYLENE GLYCOL (MIRALAX / GLYCOLAX) 17 G PACKET    Take 17 g by mouth 2 (two) times daily.  Modified Medications   No medications on file  Discontinued Medications   No medications on file    Subjective: Mark [name] is in for his routine follow-up  visit.  He has had recurrent bacteremia.  He had group A strep bacteremia in February 2019, MSSA bacteremia in January 2020 and MRSA bacteremia in May 2020.  His last bacteremia was complicated by thoracic spine infection.  He has been taking chronic doxycycline ever since that hospitalization.  However, he stopped taking it sometime in the past 2 months.  He says he stopped and his wife interrupts him and says he ran out.  He is not exactly sure when he stopped it.  He began having increased pain, swelling and "popping" in his prosthetic right knee last fall.  He thinks that this started after he quit doxycycline.  He cannot recall who replaced his knee about 8 years ago.   He saw Dr. Lindwood Qua who told him that the prosthesis was infected and loose.  He said that fluid was drained off of the knee.  It was reported the cultures grew MRSA susceptible to doxycycline and daptomycin.  He was admitted to the hospital in late November and underwent resection arthroplasty and spacer placement.  No operative cultures were obtained.  He has chronic renal insufficiency.  My partner discharged him on IV daptomycin and he completed 8 weeks of therapy on 05/02/2019 before switching back to chronic, suppressive oral doxycycline.  He denies any problems tolerating doxycycline.  Review of Systems: Review of Systems  Constitutional: Negative for chills, diaphoresis, fever and weight loss.  Gastrointestinal: Negative for abdominal pain, diarrhea, nausea and vomiting.  Musculoskeletal: Positive for back pain and joint pain.    Past Medical History:  Diagnosis Date  . Alcohol abuse   . Anemia   . Arthritis   . Ascites   . Cirrhosis ([place])   . Coffee ground emesis   . Dehydration 06/17/2017  . Febrile illness   . Heart murmur   . History of blood transfusion   . Hyperlipidemia   . Hypertension   . Leg swelling   . Myocardial infarction ([place]) 2012  . Preop cardiovascular exam 04/14/2013  . Sepsis ([place]) 06/17/2017  . Septic shock ([place]) 06/18/2017  . SIRS (systemic inflammatory response syndrome) ([place]) 07/11/2017  . Stroke [facility]) 2012   no deficits  . Thrombocytopenia ([name])     Social History   Tobacco Use  . Smoking status: Former Smoker    Packs/day: 0.50    Years: 10.00    Pack years: 5.00    Types: Cigarettes    Quit date: 01/05/2016    Years since quitting: 4.4  . Smokeless tobacco: Never Used  . Tobacco comment: 4 cigarettes a day  Vaping Use  . Vaping Use: Never used  Substance Use Topics  . Alcohol use: Yes    Comment: very little  . Drug use: Not Currently    Types: Marijuana, Heroin    Family History  Problem Relation Age of Onset  . Hypertension  Father   . Diabetes Father   . Dementia Mother   . Lupus Sister     No Known Allergies  Objective: Vitals:   06/12/20 1552  BP: (!) 114/54  Pulse: 99  Temp: 98.1 F (36.7 C)  TempSrc: Oral  Weight: 122 lb (55.3 kg)   Body mass index is 19.11 kg/m.  Physical Exam Constitutional:      Comments: He arrived 45 minutes late for his appointment today.  His weight is down 12 pounds over the past 3 months.  Cardiovascular:     Rate and Rhythm: Normal rate and regular rhythm.  Heart sounds: Murmur heard.      Comments: 2/6 systolic murmur. Pulmonary:     Effort: Pulmonary effort is normal.     Breath sounds: Normal breath sounds.  Musculoskeletal:     Comments: He has healed incisions on his right knee.  There is no unusual swelling, warmth or redness.     Lab Results  Sed rate 04/23/2020 88 C-reactive protein 04/23/2020 21  Sed Rate  Date Value  03/21/2020 90 mm/hr (H)  02/02/2020 129 mm/h (H)  07/08/2019 CANCELED   CRP  Date Value  03/21/2020 0.6 mg/dL  02/02/2020 18.3 mg/L (H)  06/30/2019 24.0 mg/L (H)     Problem List Items Addressed This Visit      High   Infection of prosthetic right knee joint ([place])    His relapsed MRSA infection causing right prosthetic knee infection is improving following surgery and back on antibiotic therapy.  There is no clinical evidence of active infection today.  Given his long history of recurrent/persistent infection I still recommend a life on doxycycline therapy.  He will follow-up with his orthopedic surgeon, Dr. Adriana Mccallum, to discuss the possibility of repeat arthroplasty.  He will follow-up here in 3 months.      Relevant Orders   C-reactive protein   Sedimentation rate       Michel Bickers, MD [facility] for Infectious [place] [phone] pager   [phone] cell 06/12/2020, 4:15 PM

## 2020-06-12 NOTE — Assessment & Plan Note (Signed)
His relapsed MRSA infection causing right prosthetic knee infection is improving following surgery and back on antibiotic therapy.  There is no clinical evidence of active infection today.  Given his long history of recurrent/persistent infection I still recommend a life on doxycycline therapy.  He will follow-up with his orthopedic surgeon, Dr. Adriana Mccallum, to discuss the possibility of repeat arthroplasty.  He will follow-up here in 3 months.

## 2020-06-13 LAB — SEDIMENTATION RATE: Sed Rate: 43 mm/h — ABNORMAL HIGH (ref 0–20)

## 2020-06-13 LAB — C-REACTIVE PROTEIN: CRP: 1.6 mg/L (ref <8.0)

## 2020-06-22 ENCOUNTER — Other Ambulatory Visit: Payer: Self-pay | Admitting: Family Medicine

## 2020-06-26 ENCOUNTER — Other Ambulatory Visit: Payer: Self-pay | Admitting: Family Medicine

## 2020-07-09 NOTE — Telephone Encounter (Signed)
Pt needs appointment for further refills 

## 2020-07-13 DIAGNOSIS — M25561 Pain in right knee: Secondary | ICD-10-CM | POA: Diagnosis not present

## 2020-07-26 NOTE — Progress Notes (Addendum)
COVID Vaccine Completed: No Date COVID Vaccine completed: Has received booster: COVID vaccine manufacturer: [employer]   Date of COVID positive in last 90 days: N/A  PCP - Grier Mitts, MD Cardiologist - Jenkins Rouge, MD.  Last OV 2016  Chest x-ray - N/A EKG - 03-13-20 Epic Stress Test -  ECHO - 09-22-18 Epic Cardiac Cath -  Pacemaker/ICD device last checked: Spinal Cord Stimulator:  Sleep Study - N/A CPAP -   Fasting Blood Sugar - N/A Checks Blood Sugar _____ times a day  Blood Thinner Instructions:  N/A Aspirin Instructions: Last Dose:  Activity level:  Can go up a flight of stairs and perform activities of daily living without stopping and without symptoms of chest pain or shortness of breath.  Pt does have some limitations due to knee pain  Anesthesia review: Acute coronary syndrome, CAD, murmur, hx of MI and stroke  Patient denies shortness of breath, fever, cough and chest pain at PAT appointment (completed over the phone)   Patient verbalized understanding of instructions that were given to them at the PAT appointment. Patient was also instructed that they will need to review over the PAT instructions again at home before surgery.

## 2020-07-26 NOTE — Patient Instructions (Addendum)
DUE TO COVID-19 ONLY ONE VISITOR IS ALLOWED TO COME WITH YOU AND STAY IN THE WAITING ROOM ONLY DURING PRE OP AND PROCEDURE.   **NO VISITORS ARE ALLOWED IN THE SHORT STAY AREA OR RECOVERY ROOM!!**  IF YOU WILL BE ADMITTED INTO THE HOSPITAL YOU ARE ALLOWED ONLY TWO SUPPORT PEOPLE DURING VISITATION HOURS ONLY (10AM -8PM)   . The support person(s) may change daily. . The support person(s) must pass our screening, gel in and out, and wear a mask at all times, including in the patient's room. . Patients must also wear a mask when staff or their support person are in the room.  No visitors under the age of 46. Any visitor under the age of 68 must be accompanied by an adult.    COVID SWAB TESTING MUST BE COMPLETED ON:  Tuesday, 07-31-20 @ 2:00 PM   [address] [place], [place] [address]  (Must self quarantine after testing. Follow instructions on handout.)        Your procedure is scheduled on: Thursday, 08-02-20   Report to [facility] Main  Entrance    Report to admitting at 7:30 AM   Call this number if you have problems the morning of surgery [phone]   Do not eat food :After Midnight.   May have liquids until 7:00 AM day of surgery  CLEAR LIQUID DIET  Foods Allowed                                                                     Foods Excluded  Water, Black Coffee and tea, regular and decaf              liquids that you cannot  Plain Jell-O in any flavor  (No red)                                    see through such as: Fruit ices (not with fruit pulp)                                      milk, soups, orange juice              Iced Popsicles (No red)                                      All solid food                                   Apple juices Sports drinks like Gatorade (No red) Lightly seasoned clear broth or consume(fat free) Sugar, honey syrup     Complete one Ensure drink the morning of surgery at 7:00 AM  the day of surgery.       1. The day of  surgery:  ? Drink ONE (1) Pre-Surgery Clear Ensure or G2 by am the morning of surgery. Drink in one sitting. Do not sip.  ? This drink was given  to you during your hospital  pre-op appointment visit. ? Nothing else to drink after completing the  Pre-Surgery Clear Ensure or G2.          If you have questions, please contact your surgeon's office.     Oral Hygiene is also important to reduce your risk of infection.                                    Remember - BRUSH YOUR TEETH THE MORNING OF SURGERY WITH YOUR REGULAR TOOTHPASTE   Do NOT smoke after Midnight   Take these medicines the morning of surgery with A SIP OF WATER:  Doxycycline, Pantoprazole                             You may not have any metal on your body including  jewelry, and body piercings             Do not wear  lotions, powders, cologne, or deodorant             Men may shave face and neck.   Do not bring valuables to the hospital. [place].   Contacts, dentures or bridgework may not be worn into surgery.   Bring small overnight bag day of surgery.    Please read over the following fact sheets you were given: IF YOU HAVE QUESTIONS ABOUT YOUR PRE OP INSTRUCTIONS PLEASE CALL  [facility] - Preparing for Surgery Before surgery, you can play an important role.  Because skin is not sterile, your skin needs to be as free of germs as possible.  You can reduce the number of germs on your skin by washing with CHG (chlorahexidine gluconate) soap before surgery.  CHG is an antiseptic cleaner which kills germs and bonds with the skin to continue killing germs even after washing. Please DO NOT use if you have an allergy to CHG or antibacterial soaps.  If your skin becomes reddened/irritated stop using the CHG and inform your nurse when you arrive at Short Stay. Do not shave (including legs and underarms) for at least 48 hours prior to the first CHG shower.  You  may shave your face/neck.  Please follow these instructions carefully:  1.  Shower with CHG Soap the night before surgery and the  morning of surgery.  2.  If you choose to wash your hair, wash your hair first as usual with your normal  shampoo.  3.  After you shampoo, rinse your hair and body thoroughly to remove the shampoo.                             4.  Use CHG as you would any other liquid soap.  You can apply chg directly to the skin and wash.  Gently with a scrungie or clean washcloth.  5.  Apply the CHG Soap to your body ONLY FROM THE NECK DOWN.   Do   not use on face/ open                           Wound or open sores. Avoid contact with eyes, ears mouth and  genitals (private parts).                       Wash face,  Genitals (private parts) with your normal soap.             6.  Wash thoroughly, paying special attention to the area where your    surgery  will be performed.  7.  Thoroughly rinse your body with warm water from the neck down.  8.  DO NOT shower/wash with your normal soap after using and rinsing off the CHG Soap.                9.  Pat yourself dry with a clean towel.            10.  Wear clean pajamas.            11.  Place clean sheets on your bed the night of your first shower and do not  sleep with pets. Day of Surgery : Do not apply any lotions/deodorants the morning of surgery.  Please wear clean clothes to the hospital/surgery center.  FAILURE TO FOLLOW THESE INSTRUCTIONS MAY RESULT IN THE CANCELLATION OF YOUR SURGERY  PATIENT SIGNATURE_________________________________  NURSE SIGNATURE__________________________________  ________________________________________________________________________   Mark [name]  An incentive spirometer is a tool that can help keep your lungs clear and active. This tool measures how well you are filling your lungs with each breath. Taking long deep breaths may help reverse or decrease the chance of developing breathing  (pulmonary) problems (especially infection) following:  A long period of time when you are unable to move or be active. BEFORE THE PROCEDURE   If the spirometer includes an indicator to show your best effort, your nurse or respiratory therapist will set it to a desired goal.  If possible, sit up straight or lean slightly forward. Try not to slouch.  Hold the incentive spirometer in an upright position. INSTRUCTIONS FOR USE  1. Sit on the edge of your bed if possible, or sit up as far as you can in bed or on a chair. 2. Hold the incentive spirometer in an upright position. 3. Breathe out normally. 4. Place the mouthpiece in your mouth and seal your lips tightly around it. 5. Breathe in slowly and as deeply as possible, raising the piston or the ball toward the top of the column. 6. Hold your breath for 3-5 seconds or for as long as possible. Allow the piston or ball to fall to the bottom of the column. 7. Remove the mouthpiece from your mouth and breathe out normally. 8. Rest for a few seconds and repeat Steps 1 through 7 at least 10 times every 1-2 hours when you are awake. Take your time and take a few normal breaths between deep breaths. 9. The spirometer may include an indicator to show your best effort. Use the indicator as a goal to work toward during each repetition. 10. After each set of 10 deep breaths, practice coughing to be sure your lungs are clear. If you have an incision (the cut made at the time of surgery), support your incision when coughing by placing a pillow or rolled up towels firmly against it. Once you are able to get out of bed, walk around indoors and cough well. You may stop using the incentive spirometer when instructed by your caregiver.  RISKS AND COMPLICATIONS  Take your time so you do not get dizzy or light-headed.  If you are in  pain, you may need to take or ask for pain medication before doing incentive spirometry. It is harder to take a deep breath if you  are having pain. AFTER USE  Rest and breathe slowly and easily.  It can be helpful to keep track of a log of your progress. Your caregiver can provide you with a simple table to help with this. If you are using the spirometer at home, follow these instructions: [place] IF:   You are having difficultly using the spirometer.  You have trouble using the spirometer as often as instructed.  Your pain medication is not giving enough relief while using the spirometer.  You develop fever of 100.5 F (38.1 C) or higher. SEEK IMMEDIATE MEDICAL CARE IF:   You cough up bloody sputum that had not been present before.  You develop fever of 102 F (38.9 C) or greater.  You develop worsening pain at or near the incision site. MAKE SURE YOU:   Understand these instructions.  Will watch your condition.  Will get help right away if you are not doing well or get worse. Document Released: 08/25/2006 Document Revised: 07/07/2011 Document Reviewed: 10/26/2006 ExitCare Patient Information 2014 ExitCare, [place].   ________________________________________________________________________  WHAT IS A BLOOD TRANSFUSION? Blood Transfusion Information  A transfusion is the replacement of blood or some of its parts. Blood is made up of multiple cells which provide different functions.  Red blood cells carry oxygen and are used for blood loss replacement.  White blood cells fight against infection.  Platelets control bleeding.  Plasma helps clot blood.  Other blood products are available for specialized needs, such as hemophilia or other clotting disorders. BEFORE THE TRANSFUSION  Who gives blood for transfusions?   Healthy volunteers who are fully evaluated to make sure their blood is safe. This is blood bank blood. Transfusion therapy is the safest it has ever been in the practice of medicine. Before blood is taken from a donor, a complete history is taken to make sure that person has  no history of diseases nor engages in risky social behavior (examples are intravenous drug use or sexual activity with multiple partners). The donor's travel history is screened to minimize risk of transmitting infections, such as malaria. The donated blood is tested for signs of infectious diseases, such as HIV and hepatitis. The blood is then tested to be sure it is compatible with you in order to minimize the chance of a transfusion reaction. If you or a relative donates blood, this is often done in anticipation of surgery and is not appropriate for emergency situations. It takes many days to process the donated blood. RISKS AND COMPLICATIONS Although transfusion therapy is very safe and saves many lives, the main dangers of transfusion include:   Getting an infectious disease.  Developing a transfusion reaction. This is an allergic reaction to something in the blood you were given. Every precaution is taken to prevent this. The decision to have a blood transfusion has been considered carefully by your caregiver before blood is given. Blood is not given unless the benefits outweigh the risks. AFTER THE TRANSFUSION  Right after receiving a blood transfusion, you will usually feel much better and more energetic. This is especially true if your red blood cells have gotten low (anemic). The transfusion raises the level of the red blood cells which carry oxygen, and this usually causes an energy increase.  The nurse administering the transfusion will monitor you carefully for complications. HOME CARE INSTRUCTIONS  No special instructions are needed after a transfusion. You may find your energy is better. Speak with your caregiver about any limitations on activity for underlying diseases you may have. SEEK MEDICAL CARE IF:   Your condition is not improving after your transfusion.  You develop redness or irritation at the intravenous (IV) site. SEEK IMMEDIATE MEDICAL CARE IF:  Any of the following  symptoms occur over the next 12 hours:  Shaking chills.  You have a temperature by mouth above 102 F (38.9 C), not controlled by medicine.  Chest, back, or muscle pain.  People around you feel you are not acting correctly or are confused.  Shortness of breath or difficulty breathing.  Dizziness and fainting.  You get a rash or develop hives.  You have a decrease in urine output.  Your urine turns a dark color or changes to pink, red, or brown. Any of the following symptoms occur over the next 10 days:  You have a temperature by mouth above 102 F (38.9 C), not controlled by medicine.  Shortness of breath.  Weakness after normal activity.  The white part of the eye turns yellow (jaundice).  You have a decrease in the amount of urine or are urinating less often.  Your urine turns a dark color or changes to pink, red, or brown. Document Released: 04/11/2000 Document Revised: 07/07/2011 Document Reviewed: 11/29/2007 [facility] Patient Information 2014 [place], [place].  _______________________________________________________________________

## 2020-07-27 ENCOUNTER — Encounter (HOSPITAL_COMMUNITY): Payer: Self-pay

## 2020-07-27 ENCOUNTER — Encounter (HOSPITAL_COMMUNITY)
Admission: RE | Admit: 2020-07-27 | Discharge: 2020-07-27 | Disposition: A | Discharge status: Home / Self Care | Payer: Medicare Other | Source: Ambulatory Visit | Attending: Orthopedic Surgery | Admitting: Orthopedic Surgery

## 2020-07-27 ENCOUNTER — Other Ambulatory Visit: Payer: Self-pay

## 2020-07-30 NOTE — H&P (Signed)
TOTAL KNEE ADMISSION H&P  Patient is being admitted for right total knee arthroplasty reimplantation  Subjective:  Chief Complaint: Status post right total knee arthroplasty resection with antibiotic spacer placement.   HPI: Mark [name], 65 y.o. male, has a history of primary right knee replacement in 2015 by Dr. Alvan Dame. He presented in September 2021 with increased pain and swelling. His knee was aspirated of purulent fluid, which was sent for culture revealing infection with staph aureus. He was then taken to the OR on 03/21/20 for resection arthroplasty and placed on IV antibiotics for 6 weeks. He is now 4 months s/p resection arthroplasty, with negative Synovasure in February 2022 and is ready to proceed with reimplantation.   Patient Active Problem List   Diagnosis Date Noted  . Infection of total left knee replacement ([place]) 03/20/2020  . Cirrhosis of liver without ascites ([place])   . AKI (acute kidney injury) ([place])   . Bacteremia   . Spinal stenosis of lumbar region without neurogenic claudication   . Aspiration pneumonia ([name]) 09/24/2018  . Abscess in epidural space of lumbar spine   . MRSA bacteremia 09/21/2018  . Infection of prosthetic right knee joint ([place]) 09/20/2018  . Anemia of chronic disease 09/20/2018  . Hypoalbuminemia 09/20/2018  . Hypoglycemia without diagnosis of diabetes mellitus 09/20/2018  . Epidural abscess 09/20/2018  . Hyperkalemia 05/18/2018  . Edentulous 05/11/2018  . Pancytopenia ([place]) 05/10/2018  . CAD (coronary artery disease) 05/10/2018  . Hepatic encephalopathy ([place]) 05/10/2018  . Alcohol abuse 05/10/2018  . GERD (gastroesophageal reflux disease) 05/10/2018  . Duodenal ulcer   . Renal failure   . Acute on chronic anemia   . Hypotension 06/17/2017  . Hyponatremia 06/17/2017  . Thrombocytopenia ([place]) 06/17/2017  . Leg edema, right 06/17/2017  . Acute metabolic encephalopathy 40/98/1191  . Anemia, iron deficiency   . Benign neoplasm of  ascending colon   . Hemorrhoids   . Portal hypertensive gastropathy ([place])   . Gastritis and gastroduodenitis   . Esophageal varices without bleeding ([place])   . H/O: CVA (cerebrovascular accident) 12/01/2013  . ACS (acute coronary syndrome) ([place]) 12/01/2013  . Anasarca 12/01/2013  . Polysubstance abuse ([place]) 12/01/2013  . CKD (chronic kidney disease) stage 3, GFR 30-59 ml/min ([name]) 12/01/2013  . S/P right TKA 05/02/2013  . Murmur 04/14/2013  . Right inguinal hernia 12/26/2010  . Cirrhosis, alcoholic ([place]) 47/82/9562   Past Medical History:  Diagnosis Date  . Alcohol abuse   . Anemia   . Arthritis   . Ascites   . Cirrhosis ([place])   . Coffee ground emesis   . Dehydration 06/17/2017  . Febrile illness   . Heart murmur   . History of blood transfusion   . Hyperlipidemia   . Hypertension   . Leg swelling   . Myocardial infarction ([place]) 2012  . Preop cardiovascular exam 04/14/2013  . Sepsis ([place]) 06/17/2017  . Septic shock ([place]) 06/18/2017  . SIRS (systemic inflammatory response syndrome) ([place]) 07/11/2017  . Stroke [facility]) 2012   no deficits  . Thrombocytopenia ([place])     Past Surgical History:  Procedure Laterality Date  . COLONOSCOPY WITH PROPOFOL N/A 02/07/2016   Procedure: COLONOSCOPY WITH PROPOFOL;  Surgeon: Milus Banister, MD;  Location: [facility] ENDOSCOPY;  Service: Endoscopy;  Laterality: N/A;  . ESOPHAGOGASTRODUODENOSCOPY (EGD) WITH PROPOFOL N/A 02/07/2016   Procedure: ESOPHAGOGASTRODUODENOSCOPY (EGD) WITH PROPOFOL;  Surgeon: Milus Banister, MD;  Location: [facility] ENDOSCOPY;  Service: Endoscopy;  Laterality: N/A;  . ESOPHAGOGASTRODUODENOSCOPY (EGD)  WITH PROPOFOL N/A 06/22/2017   Procedure: ESOPHAGOGASTRODUODENOSCOPY (EGD) WITH PROPOFOL;  Surgeon: Jerene Bears, MD;  Location: [facility] ENDOSCOPY;  Service: Gastroenterology;  Laterality: N/A;  . EXCISIONAL TOTAL KNEE ARTHROPLASTY WITH ANTIBIOTIC SPACERS Right 03/20/2020   Procedure: Resection right total knee arthroplasty and placement of  antibiotic spacer;  Surgeon: Paralee Cancel, MD;  Location: [facility] ORS;  Service: Orthopedics;  Laterality: Right;  90 mins  . HERNIA REPAIR Right    inguinal  . KNEE ARTHROSCOPY     bilateral/  12/14  . TEE WITHOUT CARDIOVERSION N/A 05/13/2018   Procedure: TRANSESOPHAGEAL ECHOCARDIOGRAM (TEE);  Surgeon: [name], MD;  Location: [facility] ENDOSCOPY;  Service: Cardiovascular;  Laterality: N/A;  . TOTAL KNEE ARTHROPLASTY Right 05/02/2013   Procedure: RIGHT TOTAL KNEE ARTHROPLASTY;  Surgeon: Mauri Pole, MD;  Location: [facility] ORS;  Service: Orthopedics;  Laterality: Right;    No current facility-administered medications for this encounter.   Current Outpatient Medications  Medication Sig Dispense Refill Last Dose  . docusate sodium (COLACE) 100 MG capsule Take 1 capsule (100 mg total) by mouth 2 (two) times daily. 28 capsule 0   . doxycycline (VIBRA-TABS) 100 MG tablet Take 1 tablet (100 mg total) by mouth 2 (two) times daily. (Patient taking differently: Take 100 mg by mouth every 12 (twelve) hours.) 60 tablet 11   . ferrous sulfate (FERROUSUL) 325 (65 FE) MG tablet Take 1 tablet (325 mg total) by mouth 3 (three) times daily with meals for 14 days. 42 tablet 0   . folic acid (FOLVITE) 1 MG tablet TAKE 1 TABLET(1 MG) BY MOUTH DAILY (Patient taking differently: Take 1 mg by mouth daily.) 90 tablet 0   . furosemide (LASIX) 20 MG tablet TAKE 1 TABLET(20 MG) BY MOUTH TWICE DAILY (Patient taking differently: Take 20 mg by mouth 2 (two) times daily.) 180 tablet 0   . naproxen (NAPROSYN) 500 MG tablet Take 500 mg by mouth 2 (two) times daily as needed for pain.     . pantoprazole (PROTONIX) 40 MG tablet TAKE 1 TABLET BY MOUTH EVERY DAY (Patient taking differently: Take 40 mg by mouth daily.) 90 tablet 0   . polyethylene glycol (MIRALAX / GLYCOLAX) 17 g packet Take 17 g by mouth 2 (two) times daily. 28 packet 0   . lactulose (CHRONULAC) 10 GM/15ML solution TAKE 45 MILLILITERS BY MOUTH TWICE DAILY (Patient not  taking: Reported on 07/24/2020) 2700 mL 3 Not Taking at Unknown time  . methocarbamol (ROBAXIN) 500 MG tablet Take 1 tablet (500 mg total) by mouth every 6 (six) hours as needed for muscle spasms. (Patient not taking: No sig reported) 40 tablet 0 Not Taking at Unknown time  . nadolol (CORGARD) 20 MG tablet TAKE 1 TABLET BY MOUTH EVERY DAY (Patient not taking: No sig reported) 30 tablet 1 Not Taking at Unknown time   No Known Allergies  Social History   Tobacco Use  . Smoking status: Former Smoker    Packs/day: 0.50    Years: 10.00    Pack years: 5.00    Types: Cigarettes    Quit date: 01/05/2016    Years since quitting: 4.5  . Smokeless tobacco: Never Used  . Tobacco comment: 4 cigarettes a day  Substance Use Topics  . Alcohol use: Yes    Comment: very little    Family History  Problem Relation Age of Onset  . Hypertension Father   . Diabetes Father   . Dementia Mother   . Lupus Sister  Review of Systems  Constitutional: Negative for chills and fever.  Respiratory: Negative for cough and shortness of breath.   Cardiovascular: Negative for chest pain.  Gastrointestinal: Negative for nausea and vomiting.  Musculoskeletal: Positive for arthralgias.    Objective:  Physical Exam Well nourished and well developed. General: Alert and oriented x3, cooperative and pleasant, no acute distress. Head: normocephalic, atraumatic, neck supple. Eyes: EOMI.  Musculoskeletal: Right Knee: Well healed arthroplasty scar. No significant warmth or erythema. No palpable effusion.  Calves soft and nontender. Motor function intact in LE. Strength 5/5 LE bilaterally. Neuro: Distal pulses 2+. Sensation to light touch intact in LE.  Vital signs in last 24 hours:    Labs:   Estimated body mass index is 20.36 kg/m as calculated from the following:   Height as of 07/27/20: 5\' 7"  (1.702 m).   Weight as of 07/27/20: 59 kg.   Imaging Review   Assessment/Plan:  Status post right total  knee arthroplasty resection with antibiotic spacer placement, right knee   The patient history, physical examination, clinical judgment of the provider and imaging studies are consistent withStatus post right total knee arthroplasty resection with antibiotic spacer placement of the right knee(s) and reimplantation total knee arthroplasty is deemed medically necessary. The treatment options including medical management, injection therapy arthroscopy and arthroplasty were discussed at length. The risks and benefits of total knee arthroplasty were presented and reviewed. The risks due to aseptic loosening, infection, stiffness, patella tracking problems, thromboembolic complications and other imponderables were discussed. The patient acknowledged the explanation, agreed to proceed with the plan and consent was signed. Patient is being admitted for inpatient treatment for surgery, pain control, PT, OT, prophylactic antibiotics, VTE prophylaxis, progressive ambulation and ADL's and discharge planning. The patient is planning to be discharged home.  Therapy Plans: outpatient therapy at [facility] Disposition: Home with Maurine Planned DVT Prophylaxis: aspirin 81mg  BID DME needed: none PCP: Dr. Volanda Napoleon TXA: IV Allergies: NKDA Anesthesia Concerns: none BMI: 22.2 Not diabetic. Other:  - Reports new hernia over the last several months, will be seeing Dr. Volanda Napoleon - Hx of TIA and "mini-heart attack" - Norco is okay  Griffith Citron, PA-C Orthopedic Surgery [facility] [place] [phone]

## 2020-07-31 ENCOUNTER — Other Ambulatory Visit (HOSPITAL_COMMUNITY): Payer: BC Managed Care – PPO

## 2020-07-31 ENCOUNTER — Inpatient Hospital Stay (HOSPITAL_COMMUNITY): Admission: RE | Admit: 2020-07-31 | Payer: BC Managed Care – PPO | Source: Ambulatory Visit

## 2020-07-31 NOTE — Progress Notes (Signed)
Patient phoned stating that he does not have transportation to get him to Covid testing site or to his lab appointment.  Patient will come in 3 hours early on 08-02-20 to get Covid test and labs prior to surgery.

## 2020-08-02 ENCOUNTER — Inpatient Hospital Stay (HOSPITAL_COMMUNITY): Payer: Medicare Other | Admitting: Physician Assistant

## 2020-08-02 ENCOUNTER — Other Ambulatory Visit: Payer: Self-pay

## 2020-08-02 ENCOUNTER — Encounter (HOSPITAL_COMMUNITY)
Admission: RE | Disposition: A | Discharge status: Home / Self Care | Payer: Self-pay | Source: Home / Self Care | Attending: Orthopedic Surgery

## 2020-08-02 ENCOUNTER — Inpatient Hospital Stay (HOSPITAL_COMMUNITY)
Admission: RE | Admit: 2020-08-02 | Discharge: 2020-08-04 | DRG: 467 | Disposition: A | Discharge status: Home / Self Care | Payer: Medicare Other | Attending: Orthopedic Surgery | Admitting: Orthopedic Surgery

## 2020-08-02 ENCOUNTER — Inpatient Hospital Stay (HOSPITAL_COMMUNITY): Payer: Medicare Other | Admitting: Certified Registered Nurse Anesthetist

## 2020-08-02 ENCOUNTER — Encounter (HOSPITAL_COMMUNITY): Payer: Self-pay | Admitting: Orthopedic Surgery

## 2020-08-02 DIAGNOSIS — E785 Hyperlipidemia, unspecified: Secondary | ICD-10-CM | POA: Diagnosis present

## 2020-08-02 DIAGNOSIS — Z8711 Personal history of peptic ulcer disease: Secondary | ICD-10-CM

## 2020-08-02 DIAGNOSIS — Z20822 Contact with and (suspected) exposure to covid-19: Secondary | ICD-10-CM | POA: Diagnosis not present

## 2020-08-02 DIAGNOSIS — Z96651 Presence of right artificial knee joint: Secondary | ICD-10-CM

## 2020-08-02 DIAGNOSIS — I2510 Atherosclerotic heart disease of native coronary artery without angina pectoris: Secondary | ICD-10-CM | POA: Diagnosis present

## 2020-08-02 DIAGNOSIS — Z8673 Personal history of transient ischemic attack (TIA), and cerebral infarction without residual deficits: Secondary | ICD-10-CM | POA: Diagnosis not present

## 2020-08-02 DIAGNOSIS — Z86010 Personal history of colonic polyps: Secondary | ICD-10-CM

## 2020-08-02 DIAGNOSIS — K219 Gastro-esophageal reflux disease without esophagitis: Secondary | ICD-10-CM | POA: Diagnosis not present

## 2020-08-02 DIAGNOSIS — T8453XD Infection and inflammatory reaction due to internal right knee prosthesis, subsequent encounter: Secondary | ICD-10-CM

## 2020-08-02 DIAGNOSIS — Z833 Family history of diabetes mellitus: Secondary | ICD-10-CM

## 2020-08-02 DIAGNOSIS — D62 Acute posthemorrhagic anemia: Secondary | ICD-10-CM | POA: Diagnosis not present

## 2020-08-02 DIAGNOSIS — T8453XA Infection and inflammatory reaction due to internal right knee prosthesis, initial encounter: Secondary | ICD-10-CM | POA: Diagnosis not present

## 2020-08-02 DIAGNOSIS — G8918 Other acute postprocedural pain: Secondary | ICD-10-CM | POA: Diagnosis not present

## 2020-08-02 DIAGNOSIS — K7030 Alcoholic cirrhosis of liver without ascites: Secondary | ICD-10-CM | POA: Diagnosis not present

## 2020-08-02 DIAGNOSIS — Z4733 Aftercare following explantation of knee joint prosthesis: Principal | ICD-10-CM

## 2020-08-02 DIAGNOSIS — N183 Chronic kidney disease, stage 3 unspecified: Secondary | ICD-10-CM | POA: Diagnosis present

## 2020-08-02 DIAGNOSIS — Z79899 Other long term (current) drug therapy: Secondary | ICD-10-CM | POA: Diagnosis not present

## 2020-08-02 DIAGNOSIS — D631 Anemia in chronic kidney disease: Secondary | ICD-10-CM | POA: Diagnosis not present

## 2020-08-02 DIAGNOSIS — M48061 Spinal stenosis, lumbar region without neurogenic claudication: Secondary | ICD-10-CM | POA: Diagnosis not present

## 2020-08-02 DIAGNOSIS — I129 Hypertensive chronic kidney disease with stage 1 through stage 4 chronic kidney disease, or unspecified chronic kidney disease: Secondary | ICD-10-CM | POA: Diagnosis present

## 2020-08-02 DIAGNOSIS — Z8249 Family history of ischemic heart disease and other diseases of the circulatory system: Secondary | ICD-10-CM

## 2020-08-02 DIAGNOSIS — I252 Old myocardial infarction: Secondary | ICD-10-CM

## 2020-08-02 DIAGNOSIS — D638 Anemia in other chronic diseases classified elsewhere: Secondary | ICD-10-CM | POA: Diagnosis not present

## 2020-08-02 DIAGNOSIS — F17210 Nicotine dependence, cigarettes, uncomplicated: Secondary | ICD-10-CM | POA: Diagnosis not present

## 2020-08-02 DIAGNOSIS — Z832 Family history of diseases of the blood and blood-forming organs and certain disorders involving the immune mechanism: Secondary | ICD-10-CM

## 2020-08-02 HISTORY — PX: REIMPLANTATION OF TOTAL KNEE: SHX6052

## 2020-08-02 LAB — COMPREHENSIVE METABOLIC PANEL
ALT: 11 U/L (ref 0–44)
AST: 25 U/L (ref 15–41)
Albumin: 3 g/dL — ABNORMAL LOW (ref 3.5–5.0)
Alkaline Phosphatase: 106 U/L (ref 38–126)
Anion gap: 6 (ref 5–15)
BUN: 15 mg/dL (ref 8–23)
CO2: 18 mmol/L — ABNORMAL LOW (ref 22–32)
Calcium: 8.2 mg/dL — ABNORMAL LOW (ref 8.9–10.3)
Chloride: 111 mmol/L (ref 98–111)
Creatinine, Ser: 1.22 mg/dL (ref 0.61–1.24)
GFR, Estimated: >60 mL/min (ref >60)
Glucose, Bld: 87 mg/dL (ref 70–99)
Potassium: 4.5 mmol/L (ref 3.5–5.1)
Sodium: 135 mmol/L (ref 135–145)
Total Bilirubin: 0.8 mg/dL (ref 0.3–1.2)
Total Protein: 7.1 g/dL (ref 6.5–8.1)

## 2020-08-02 LAB — CBC
HCT: 23.4 % — ABNORMAL LOW (ref 39.0–52.0)
Hemoglobin: 7.9 g/dL — ABNORMAL LOW (ref 13.0–17.0)
MCH: 32.9 pg (ref 26.0–34.0)
MCHC: 33.8 g/dL (ref 30.0–36.0)
MCV: 97.5 fL (ref 80.0–100.0)
Platelets: 115 10*3/uL — ABNORMAL LOW (ref 150–400)
RBC: 2.4 MIL/uL — ABNORMAL LOW (ref 4.22–5.81)
RDW: 14.8 % (ref 11.5–15.5)
WBC: 2.2 10*3/uL — ABNORMAL LOW (ref 4.0–10.5)
nRBC: 0 % (ref 0.0–0.2)

## 2020-08-02 LAB — SARS CORONAVIRUS 2 BY RT PCR (HOSPITAL ORDER, PERFORMED IN ~~LOC~~ HOSPITAL LAB): SARS Coronavirus 2: NEGATIVE

## 2020-08-02 LAB — SURGICAL PCR SCREEN
MRSA, PCR: NEGATIVE
Staphylococcus aureus: NEGATIVE

## 2020-08-02 LAB — APTT: aPTT: 48 seconds — ABNORMAL HIGH (ref 24–36)

## 2020-08-02 LAB — PROTIME-INR
INR: 1.3 — ABNORMAL HIGH (ref 0.8–1.2)
Prothrombin Time: 15.8 seconds — ABNORMAL HIGH (ref 11.4–15.2)

## 2020-08-02 SURGERY — REVISION, TOTAL ARTHROPLASTY, KNEE
Anesthesia: General | Site: Knee | Laterality: Right

## 2020-08-02 MED ORDER — FOLIC ACID 1 MG PO TABS
1.0000 mg | ORAL_TABLET | Freq: Every day | ORAL | Status: DC
  Administered 2020-08-03 – 2020-08-04 (×2): 1 mg via ORAL
  Filled 2020-08-02 (×2): qty 1

## 2020-08-02 MED ORDER — MENTHOL 3 MG MT LOZG: 1.0000 | LOZENGE | OROMUCOSAL | Status: DC | PRN

## 2020-08-02 MED ORDER — VANCOMYCIN HCL 1000 MG IV SOLR
INTRAVENOUS | Status: DC | PRN
  Administered 2020-08-02: 2 g

## 2020-08-02 MED ORDER — HYDROMORPHONE HCL 1 MG/ML IJ SOLN
INTRAMUSCULAR | Status: AC
  Filled 2020-08-02: qty 1

## 2020-08-02 MED ORDER — HYDROMORPHONE HCL 1 MG/ML IJ SOLN
0.5000 mg | INTRAMUSCULAR | Status: DC | PRN
Start: 2020-08-02 — End: 2020-08-02

## 2020-08-02 MED ORDER — DEXAMETHASONE SODIUM PHOSPHATE 10 MG/ML IJ SOLN
10.0000 mg | Freq: Once | INTRAMUSCULAR | Status: AC
  Administered 2020-08-03: 10 mg via INTRAVENOUS
  Filled 2020-08-02: qty 1

## 2020-08-02 MED ORDER — DOCUSATE SODIUM 100 MG PO CAPS
100.0000 mg | ORAL_CAPSULE | Freq: Two times a day (BID) | ORAL | Status: DC
  Administered 2020-08-02 – 2020-08-03 (×3): 100 mg via ORAL
  Filled 2020-08-02 (×4): qty 1

## 2020-08-02 MED ORDER — FERROUS SULFATE 325 (65 FE) MG PO TABS: 325.0000 mg | ORAL_TABLET | Freq: Three times a day (TID) | ORAL | Status: DC

## 2020-08-02 MED ORDER — FENTANYL CITRATE (PF) 100 MCG/2ML IJ SOLN
50.0000 ug | INTRAMUSCULAR | Status: DC
  Administered 2020-08-02: 50 ug via INTRAVENOUS
  Filled 2020-08-02: qty 2

## 2020-08-02 MED ORDER — SUCCINYLCHOLINE CHLORIDE 200 MG/10ML IV SOSY
PREFILLED_SYRINGE | INTRAVENOUS | Status: DC | PRN
  Administered 2020-08-02: 100 mg via INTRAVENOUS

## 2020-08-02 MED ORDER — HYDROMORPHONE HCL 1 MG/ML IJ SOLN: 0.2500 mg | INTRAMUSCULAR | Status: DC | PRN

## 2020-08-02 MED ORDER — PANTOPRAZOLE SODIUM 40 MG PO TBEC
40.0000 mg | DELAYED_RELEASE_TABLET | Freq: Every day | ORAL | Status: DC
  Administered 2020-08-03 – 2020-08-04 (×2): 40 mg via ORAL
  Filled 2020-08-02 (×2): qty 1

## 2020-08-02 MED ORDER — HYDROMORPHONE HCL 1 MG/ML IJ SOLN
0.5000 mg | INTRAMUSCULAR | Status: DC | PRN
  Administered 2020-08-03 (×2): 1 mg via INTRAVENOUS
  Filled 2020-08-02 (×3): qty 1

## 2020-08-02 MED ORDER — PHENOL 1.4 % MT LIQD: 1.0000 | OROMUCOSAL | Status: DC | PRN

## 2020-08-02 MED ORDER — CEFAZOLIN SODIUM-DEXTROSE 2-4 GM/100ML-% IV SOLN
2.0000 g | Freq: Four times a day (QID) | INTRAVENOUS | Status: AC
  Administered 2020-08-02 – 2020-08-03 (×2): 2 g via INTRAVENOUS
  Filled 2020-08-02 (×2): qty 100

## 2020-08-02 MED ORDER — HYDROMORPHONE HCL 2 MG/ML IJ SOLN
INTRAMUSCULAR | Status: AC
  Filled 2020-08-02: qty 1

## 2020-08-02 MED ORDER — VANCOMYCIN HCL 1000 MG IV SOLR
INTRAVENOUS | Status: AC
  Filled 2020-08-02: qty 1000

## 2020-08-02 MED ORDER — KETOROLAC TROMETHAMINE 30 MG/ML IJ SOLN
INTRAMUSCULAR | Status: DC | PRN
  Administered 2020-08-02: 30 mg

## 2020-08-02 MED ORDER — BUPIVACAINE-EPINEPHRINE (PF) 0.25% -1:200000 IJ SOLN
INTRAMUSCULAR | Status: AC
  Filled 2020-08-02: qty 30

## 2020-08-02 MED ORDER — 0.9 % SODIUM CHLORIDE (POUR BTL) OPTIME
TOPICAL | Status: DC | PRN
  Administered 2020-08-02: 1000 mL

## 2020-08-02 MED ORDER — HYDROMORPHONE HCL 1 MG/ML IJ SOLN
0.2500 mg | INTRAMUSCULAR | Status: DC | PRN
Start: 2020-08-02 — End: 2020-08-02
  Administered 2020-08-02 (×5): 0.5 mg via INTRAVENOUS

## 2020-08-02 MED ORDER — METHOCARBAMOL 500 MG PO TABS
500.0000 mg | ORAL_TABLET | Freq: Four times a day (QID) | ORAL | Status: DC | PRN
  Administered 2020-08-02 – 2020-08-04 (×6): 500 mg via ORAL
  Filled 2020-08-02 (×6): qty 1

## 2020-08-02 MED ORDER — SUGAMMADEX SODIUM 200 MG/2ML IV SOLN
INTRAVENOUS | Status: DC | PRN
  Administered 2020-08-02 (×2): 100 mg via INTRAVENOUS

## 2020-08-02 MED ORDER — ONDANSETRON HCL 4 MG/2ML IJ SOLN: 4.0000 mg | Freq: Four times a day (QID) | INTRAMUSCULAR | Status: DC | PRN

## 2020-08-02 MED ORDER — ROCURONIUM BROMIDE 10 MG/ML (PF) SYRINGE
PREFILLED_SYRINGE | INTRAVENOUS | Status: DC | PRN
  Administered 2020-08-02: 50 mg via INTRAVENOUS

## 2020-08-02 MED ORDER — DOXYCYCLINE HYCLATE 100 MG PO TABS
100.0000 mg | ORAL_TABLET | Freq: Two times a day (BID) | ORAL | Status: DC
  Administered 2020-08-03 (×2): 100 mg via ORAL
  Filled 2020-08-02 (×3): qty 1

## 2020-08-02 MED ORDER — METHOCARBAMOL 500 MG IVPB - SIMPLE MED
500.0000 mg | Freq: Four times a day (QID) | INTRAVENOUS | Status: DC | PRN
  Administered 2020-08-02: 500 mg via INTRAVENOUS
  Filled 2020-08-02: qty 50

## 2020-08-02 MED ORDER — PROPOFOL 1000 MG/100ML IV EMUL
INTRAVENOUS | Status: AC
  Filled 2020-08-02: qty 100

## 2020-08-02 MED ORDER — SODIUM CHLORIDE (PF) 0.9 % IJ SOLN
INTRAMUSCULAR | Status: DC | PRN
  Administered 2020-08-02: 30 mL

## 2020-08-02 MED ORDER — LACTATED RINGERS IV SOLN: INTRAVENOUS | Status: DC

## 2020-08-02 MED ORDER — MIDAZOLAM HCL 2 MG/2ML IJ SOLN
1.0000 mg | INTRAMUSCULAR | Status: DC
  Administered 2020-08-02: 1 mg via INTRAVENOUS
  Filled 2020-08-02: qty 2

## 2020-08-02 MED ORDER — FENTANYL CITRATE (PF) 250 MCG/5ML IJ SOLN
INTRAMUSCULAR | Status: AC
  Filled 2020-08-02: qty 5

## 2020-08-02 MED ORDER — OXYCODONE HCL 5 MG PO TABS
5.0000 mg | ORAL_TABLET | ORAL | Status: DC | PRN
Start: 2020-08-02 — End: 2020-08-04

## 2020-08-02 MED ORDER — FENTANYL CITRATE (PF) 100 MCG/2ML IJ SOLN
INTRAMUSCULAR | Status: DC | PRN
  Administered 2020-08-02: 200 ug via INTRAVENOUS
  Administered 2020-08-02: 50 ug via INTRAVENOUS

## 2020-08-02 MED ORDER — OXYCODONE HCL 5 MG PO TABS
10.0000 mg | ORAL_TABLET | ORAL | Status: DC | PRN
  Administered 2020-08-02 – 2020-08-04 (×10): 15 mg via ORAL
  Filled 2020-08-02 (×10): qty 3

## 2020-08-02 MED ORDER — ONDANSETRON HCL 4 MG/2ML IJ SOLN
INTRAMUSCULAR | Status: DC | PRN
  Administered 2020-08-02: 4 mg via INTRAVENOUS

## 2020-08-02 MED ORDER — STERILE WATER FOR IRRIGATION IR SOLN
Status: DC | PRN
  Administered 2020-08-02: 2000 mL

## 2020-08-02 MED ORDER — HYDROMORPHONE HCL 1 MG/ML IJ SOLN
INTRAMUSCULAR | Status: DC | PRN
  Administered 2020-08-02 (×2): .5 mg via INTRAVENOUS

## 2020-08-02 MED ORDER — DEXAMETHASONE SODIUM PHOSPHATE 10 MG/ML IJ SOLN
INTRAMUSCULAR | Status: AC
  Filled 2020-08-02: qty 1

## 2020-08-02 MED ORDER — TRANEXAMIC ACID-NACL 1000-0.7 MG/100ML-% IV SOLN
1000.0000 mg | Freq: Once | INTRAVENOUS | Status: AC
  Administered 2020-08-02: 1000 mg via INTRAVENOUS
  Filled 2020-08-02: qty 100

## 2020-08-02 MED ORDER — DEXAMETHASONE SODIUM PHOSPHATE 10 MG/ML IJ SOLN: 8.0000 mg | Freq: Once | INTRAMUSCULAR | Status: DC

## 2020-08-02 MED ORDER — ROCURONIUM BROMIDE 10 MG/ML (PF) SYRINGE
PREFILLED_SYRINGE | INTRAVENOUS | Status: AC
  Filled 2020-08-02: qty 10

## 2020-08-02 MED ORDER — CEFAZOLIN SODIUM-DEXTROSE 2-4 GM/100ML-% IV SOLN
2.0000 g | INTRAVENOUS | Status: AC
  Administered 2020-08-02: 2 g via INTRAVENOUS
  Filled 2020-08-02: qty 100

## 2020-08-02 MED ORDER — ONDANSETRON HCL 4 MG PO TABS: 4.0000 mg | ORAL_TABLET | Freq: Four times a day (QID) | ORAL | Status: DC | PRN

## 2020-08-02 MED ORDER — ACETAMINOPHEN 325 MG PO TABS
325.0000 mg | ORAL_TABLET | Freq: Four times a day (QID) | ORAL | Status: DC | PRN
  Administered 2020-08-04: 650 mg via ORAL
  Filled 2020-08-02: qty 2

## 2020-08-02 MED ORDER — KETOROLAC TROMETHAMINE 30 MG/ML IJ SOLN
INTRAMUSCULAR | Status: AC
  Filled 2020-08-02: qty 1

## 2020-08-02 MED ORDER — ONDANSETRON HCL 4 MG/2ML IJ SOLN
INTRAMUSCULAR | Status: AC
  Filled 2020-08-02: qty 2

## 2020-08-02 MED ORDER — POLYETHYLENE GLYCOL 3350 17 G PO PACK: 17.0000 g | PACK | Freq: Every day | ORAL | Status: DC | PRN

## 2020-08-02 MED ORDER — ONDANSETRON HCL 4 MG/2ML IJ SOLN: 4.0000 mg | Freq: Once | INTRAMUSCULAR | Status: DC | PRN

## 2020-08-02 MED ORDER — SODIUM CHLORIDE 0.9 % IV SOLN: INTRAVENOUS | Status: DC

## 2020-08-02 MED ORDER — BISACODYL 10 MG RE SUPP: 10.0000 mg | Freq: Every day | RECTAL | Status: DC | PRN

## 2020-08-02 MED ORDER — METOCLOPRAMIDE HCL 5 MG/ML IJ SOLN
5.0000 mg | Freq: Three times a day (TID) | INTRAMUSCULAR | Status: DC | PRN
Start: 2020-08-02 — End: 2020-08-04

## 2020-08-02 MED ORDER — LIDOCAINE 2% (20 MG/ML) 5 ML SYRINGE
INTRAMUSCULAR | Status: AC
  Filled 2020-08-02: qty 5

## 2020-08-02 MED ORDER — POVIDONE-IODINE 10 % EX SWAB: 2.0000 "application " | Freq: Once | CUTANEOUS | Status: DC

## 2020-08-02 MED ORDER — TOBRAMYCIN SULFATE 1.2 G IJ SOLR
INTRAMUSCULAR | Status: AC
  Filled 2020-08-02: qty 1.2

## 2020-08-02 MED ORDER — BUPIVACAINE-EPINEPHRINE (PF) 0.25% -1:200000 IJ SOLN
INTRAMUSCULAR | Status: DC | PRN
  Administered 2020-08-02: 30 mL

## 2020-08-02 MED ORDER — TRANEXAMIC ACID-NACL 1000-0.7 MG/100ML-% IV SOLN
1000.0000 mg | INTRAVENOUS | Status: AC
  Administered 2020-08-02: 1000 mg via INTRAVENOUS
  Filled 2020-08-02: qty 100

## 2020-08-02 MED ORDER — LIDOCAINE 2% (20 MG/ML) 5 ML SYRINGE
INTRAMUSCULAR | Status: DC | PRN
  Administered 2020-08-02: 80 mg via INTRAVENOUS

## 2020-08-02 MED ORDER — SUCCINYLCHOLINE CHLORIDE 200 MG/10ML IV SOSY
PREFILLED_SYRINGE | INTRAVENOUS | Status: AC
  Filled 2020-08-02: qty 10

## 2020-08-02 MED ORDER — ORAL CARE MOUTH RINSE
15.0000 mL | Freq: Once | OROMUCOSAL | Status: AC
  Administered 2020-08-02: 15 mL via OROMUCOSAL

## 2020-08-02 MED ORDER — METOCLOPRAMIDE HCL 5 MG PO TABS: 5.0000 mg | ORAL_TABLET | Freq: Three times a day (TID) | ORAL | Status: DC | PRN

## 2020-08-02 MED ORDER — ROPIVACAINE HCL 7.5 MG/ML IJ SOLN
INTRAMUSCULAR | Status: DC | PRN
  Administered 2020-08-02: 20 mL via PERINEURAL

## 2020-08-02 MED ORDER — FERROUS SULFATE 325 (65 FE) MG PO TABS
325.0000 mg | ORAL_TABLET | Freq: Three times a day (TID) | ORAL | Status: DC
  Administered 2020-08-02 – 2020-08-03 (×4): 325 mg via ORAL
  Filled 2020-08-02 (×5): qty 1

## 2020-08-02 MED ORDER — FUROSEMIDE 20 MG PO TABS
20.0000 mg | ORAL_TABLET | Freq: Two times a day (BID) | ORAL | Status: DC
  Administered 2020-08-03 – 2020-08-04 (×3): 20 mg via ORAL
  Filled 2020-08-02 (×3): qty 1

## 2020-08-02 MED ORDER — DEXAMETHASONE SODIUM PHOSPHATE 10 MG/ML IJ SOLN
INTRAMUSCULAR | Status: DC | PRN
  Administered 2020-08-02: 10 mg via INTRAVENOUS

## 2020-08-02 MED ORDER — ASPIRIN 81 MG PO CHEW
81.0000 mg | CHEWABLE_TABLET | Freq: Two times a day (BID) | ORAL | Status: DC
  Administered 2020-08-02 – 2020-08-04 (×4): 81 mg via ORAL
  Filled 2020-08-02 (×4): qty 1

## 2020-08-02 MED ORDER — PROPOFOL 10 MG/ML IV BOLUS
INTRAVENOUS | Status: DC | PRN
  Administered 2020-08-02: 50 mg via INTRAVENOUS
  Administered 2020-08-02: 120 mg via INTRAVENOUS

## 2020-08-02 MED ORDER — CHLORHEXIDINE GLUCONATE 0.12 % MT SOLN: 15.0000 mL | Freq: Once | OROMUCOSAL | Status: AC

## 2020-08-02 MED ORDER — DIPHENHYDRAMINE HCL 12.5 MG/5ML PO ELIX
12.5000 mg | ORAL_SOLUTION | ORAL | Status: DC | PRN
Start: 2020-08-02 — End: 2020-08-04

## 2020-08-02 MED ORDER — FUROSEMIDE 20 MG PO TABS
20.0000 mg | ORAL_TABLET | Freq: Once | ORAL | Status: AC
  Administered 2020-08-02: 20 mg via ORAL
  Filled 2020-08-02: qty 1

## 2020-08-02 MED ORDER — METHOCARBAMOL 500 MG IVPB - SIMPLE MED
INTRAVENOUS | Status: AC
  Filled 2020-08-02: qty 50

## 2020-08-02 SURGICAL SUPPLY — 71 items
ADH SKN CLS APL DERMABOND .7 (GAUZE/BANDAGES/DRESSINGS) ×1
ATTUNE PSRP INSR SZ6 5 KNEE (Insert) ×2 IMPLANT
ATUNE CEMENTED STEM 16X80 ×2 IMPLANT
AUG FEM SZ6 4 REV DIST STRL LF (Miscellaneous) ×1 IMPLANT
AUG FEM SZ6 8 REV POST STRL LF (Miscellaneous) ×2 IMPLANT
AUG POST FEM KNEE SZ6 8 (Miscellaneous) ×4 IMPLANT
AUGMENT DISTAL FEM SZ6 4 KNEE (Miscellaneous) ×2 IMPLANT
AUGMENT POST FEM KNEE SZ6 8 (Miscellaneous) ×2 IMPLANT
BAG SPEC THK2 15X12 ZIP CLS (MISCELLANEOUS) ×1
BAG ZIPLOCK 12X15 (MISCELLANEOUS) ×2 IMPLANT
BLADE SAW SGTL 11.0X1.19X90.0M (BLADE) ×2 IMPLANT
BLADE SAW SGTL 13.0X1.19X90.0M (BLADE) ×2 IMPLANT
BLADE SAW SGTL 81X20 HD (BLADE) ×2 IMPLANT
BNDG ELASTIC 6X5.8 VLCR STR LF (GAUZE/BANDAGES/DRESSINGS) ×2 IMPLANT
BOWL SMART MIX CTS (DISPOSABLE) ×2 IMPLANT
BRUSH FEMORAL CANAL (MISCELLANEOUS) ×2 IMPLANT
BSPLAT TIB 4 CMNT REV ROT PLAT (Knees) ×1 IMPLANT
CEMENT HV SMART SET (Cement) ×6 IMPLANT
CEMENT RESTRICTOR DEPUY SZ 6 (Cement) ×2 IMPLANT
COVER SURGICAL LIGHT HANDLE (MISCELLANEOUS) ×2 IMPLANT
COVER WAND RF STERILE (DRAPES) IMPLANT
CUFF TOURN SGL QUICK 34 (TOURNIQUET CUFF) ×2
CUFF TRNQT CYL 34X4.125X (TOURNIQUET CUFF) ×1 IMPLANT
DERMABOND ADVANCED (GAUZE/BANDAGES/DRESSINGS) ×1
DERMABOND ADVANCED .7 DNX12 (GAUZE/BANDAGES/DRESSINGS) ×1 IMPLANT
DRAPE U-SHAPE 47X51 STRL (DRAPES) ×4 IMPLANT
DRESSING AQUACEL AG SP 3.5X10 (GAUZE/BANDAGES/DRESSINGS) IMPLANT
DRSG AQUACEL AG ADV 3.5X14 (GAUZE/BANDAGES/DRESSINGS) ×2 IMPLANT
DRSG AQUACEL AG SP 3.5X10 (GAUZE/BANDAGES/DRESSINGS)
DURAPREP 26ML APPLICATOR (WOUND CARE) ×4 IMPLANT
ELECT REM PT RETURN 15FT ADLT (MISCELLANEOUS) ×2 IMPLANT
FEM KNEE ATTUNE REV CRS SZ6 RT (Orthopedic Implant) ×2 IMPLANT
FEMORAL KNE ATTN REV CRS SZ6RT (Orthopedic Implant) IMPLANT
GLOVE ORTHO TXT STRL SZ7.5 (GLOVE) ×4 IMPLANT
GLOVE SURG ENC MOIS LTX SZ6 (GLOVE) ×2 IMPLANT
GLOVE SURG UNDER LTX SZ6.5 (GLOVE) ×2 IMPLANT
GLOVE SURG UNDER POLY LF SZ7.5 (GLOVE) ×2 IMPLANT
GOWN STRL REUS W/TWL LRG LVL3 (GOWN DISPOSABLE) ×4 IMPLANT
HANDPIECE INTERPULSE COAX TIP (DISPOSABLE) ×2
HOLDER FOLEY CATH W/STRAP (MISCELLANEOUS) ×2 IMPLANT
INSERT TIB CMT ATTUNE RP SZ4 (Knees) ×2 IMPLANT
JET LAVAGE IRRISEPT WOUND (IRRIGATION / IRRIGATOR) ×2
KIT TURNOVER KIT A (KITS) ×2 IMPLANT
LAVAGE JET IRRISEPT WOUND (IRRIGATION / IRRIGATOR) ×1 IMPLANT
MANIFOLD NEPTUNE II (INSTRUMENTS) ×2 IMPLANT
NDL SAFETY ECLIPSE 18X1.5 (NEEDLE) ×2 IMPLANT
NEEDLE HYPO 18GX1.5 SHARP (NEEDLE) ×4
NS IRRIG 1000ML POUR BTL (IV SOLUTION) ×2 IMPLANT
PACK TOTAL KNEE CUSTOM (KITS) ×2 IMPLANT
PADDING CAST COTTON 6X4 STRL (CAST SUPPLIES) ×1 IMPLANT
PENCIL SMOKE EVACUATOR (MISCELLANEOUS) ×2 IMPLANT
PROTECTOR NERVE ULNAR (MISCELLANEOUS) ×2 IMPLANT
SET HNDPC FAN SPRY TIP SCT (DISPOSABLE) IMPLANT
SET PAD KNEE POSITIONER (MISCELLANEOUS) ×2 IMPLANT
SLEEVE TIB ATTUNE FP 45 (Knees) ×2 IMPLANT
STAPLER VISISTAT 35W (STAPLE) IMPLANT
STEM ATTUNE CEMENTED 16X80 IMPLANT
STEM STR ATTUNE PF 16X60 (Knees) ×2 IMPLANT
SUT MNCRL AB 3-0 PS2 18 (SUTURE) ×2 IMPLANT
SUT STRATAFIX PDS+ 0 24IN (SUTURE) ×2 IMPLANT
SUT VIC AB 1 CT1 36 (SUTURE) ×6 IMPLANT
SUT VIC AB 2-0 CT1 27 (SUTURE) ×6
SUT VIC AB 2-0 CT1 TAPERPNT 27 (SUTURE) ×3 IMPLANT
SWAB COLLECTION DEVICE MRSA (MISCELLANEOUS) IMPLANT
SWAB CULTURE ESWAB REG 1ML (MISCELLANEOUS) IMPLANT
SYR 3ML LL SCALE MARK (SYRINGE) ×4 IMPLANT
TOWER CARTRIDGE SMART MIX (DISPOSABLE) ×2 IMPLANT
TRAY FOLEY MTR SLVR 16FR STAT (SET/KITS/TRAYS/PACK) ×2 IMPLANT
TUBE SUCTION HIGH CAP CLEAR NV (SUCTIONS) ×2 IMPLANT
WATER STERILE IRR 1000ML POUR (IV SOLUTION) ×4 IMPLANT
WRAP KNEE MAXI GEL POST OP (GAUZE/BANDAGES/DRESSINGS) ×2 IMPLANT

## 2020-08-02 NOTE — Anesthesia Procedure Notes (Signed)
Procedure Name: Intubation Date/Time: 08/02/2020 11:46 AM Performed by: Claudia Desanctis, CRNA Pre-anesthesia Checklist: Patient identified, Emergency Drugs available, Suction available and Patient being monitored Patient Re-evaluated:Patient Re-evaluated prior to induction Oxygen Delivery Method: Circle system utilized Preoxygenation: Pre-oxygenation with 100% oxygen Induction Type: IV induction Ventilation: Mask ventilation without difficulty Laryngoscope Size: 2 and Miller Grade View: Grade I Tube type: Oral Number of attempts: 1 Airway Equipment and Method: Stylet Placement Confirmation: ETT inserted through vocal cords under direct vision,  positive ETCO2 and breath sounds checked- equal and bilateral Tube secured with: Tape Dental Injury: Teeth and Oropharynx as per pre-operative assessment

## 2020-08-02 NOTE — Op Note (Signed)
NAME: Mark [name], Mark [name] MEDICAL RECORD NO: [id number] ACCOUNT NO: [id number] DATE OF BIRTH: 05-18-1955 FACILITY: [facility]ATION: [facility] PHYSICIAN: Pietro Cassis. Alvan Dame, MD  Operative Report   DATE OF PROCEDURE: 08/02/2020  PREOPERATIVE DIAGNOSIS:  History of right total knee arthroplasty complicated by infection requiring resection and placement of antibiotic spacer.  POSTOPERATIVE DIAGNOSIS:  History of right total knee arthroplasty complicated by infection requiring resection and placement of antibiotic spacer.  PROCEDURE:  Revision/reimplantation of a right total knee arthroplasty with removal of previously placed articulating spacer.  SURGEON:  Pietro Cassis. Alvan Dame, MD  ASSISTANT:  Asencion Partridge, PA-C.  Note that Ms. Sheran Lawless was present for the entirety of the case for preoperative positioning, perioperative management of the operative extremity general facilitation of the case and primary wound closure.  ANESTHESIA:  Regional plus general.  SPECIMEN:  None.  COMPLICATION:  None.  FINDINGS:  Please see dictated operative note.  TOURNIQUET TIME:  67 minutes at 250 mmHg.  INDICATIONS:  The patient is a 65 year old male with a remote history of right total knee arthroplasty approximately 10 years ago.  He presented to the office and over the last year with increasing pain and swelling.  Workup in the office indicated  infection.  He is now approximately 3 months after resection of the total knee and placement of antibiotic spacer with IV antibiotics for 6 weeks.  He presented to the office.  Lab work had normalized and at this point, he was ready to proceed with total  knee replacement.  We reviewed the risk of recurrence of infection, DVT, stiffness and need for future surgeries.  We reviewed the postoperative course and expectations.  Consent was obtained for the benefit of pain relief.  DESCRIPTION OF PROCEDURE:  The patient was brought to the operative theater.  Once adequate anesthesia,  preoperative antibiotics, Ancef administered as well as tranexamic acid and Decadron.  He was positioned supine with a right thigh tourniquet placed.   The right lower extremity was then prepped and draped in sterile fashion.  Timeout was performed identifying the patient, planned procedure, and extremity.  His leg was exsanguinated, tourniquet elevated to 250 mmHg.  His old incision was incised.  Soft  tissue planes created.  Medial arthrotomy was made, encountering clear yellow synovial fluid.  I performed a medial, lateral and suprapatellar synovectomy as well as evaluated his patella.  As I exposed the patella and performed debridement I measured  the remaining bone stalk of the patella to be less than 13 mm.  I did not feel that it was safe for me to remove further bone and apply a button.  For that reason, I left his patella unresurfaced.  Once I had done this, we were able to expose the knee  and flexed the knee up.  I was able to remove the previously placed cemented articulating spacer.  We removed all cement.  We then debrided the ends of the bones.  Once this was done, we tended to the tibia side first.  I made a small thin cut off the  proximal tip really is a fresh cut.  I then broached to a size 45 broach.  We placed a size 4 trial with a 45 sleeve and a 60 mm stem into the proximal tibia.  The orientation of this was perpendicular in the coronal plane.  I then sized and determined,  I would use a size 6 femur.  We used per protocol the jigs available to revisit and evaluate the  distal cut.  I determined that I would need a 4 mm augment distal lateral.  We addressed posteriorly and made the cuts around the knee, we decided that I  would need augments 8 mm on the medial and lateral posterior aspect of the femur.  Given this, we placed a trial component following preparation of the bone per protocol along with the jigs available, the size 6 revision femoral component was impacted  with 90 mm  stem.  We did a trial reduction and found the knee came basically to full extension and flexed with a balanced knee with the remaining patella tracking through the trochlea without complication.  Given these findings, all trial components were  removed.  We irrigated the canals with a canal brush irrigator.  I then irrigated the knee with a regular pulse lavage.  On the back table the final components were opened and configured under my direct supervision.  We then mixed cement.  The knee was  flexed and the knee was dried and the femoral component was cemented into place.  Again, this was a size 6 femoral component with 8 mm posterior, medial and lateral augments, 4-mm distal lateral augment and a cemented 14 x 90 mm stem.  On the tibia side,  it was a press-fit size 4 tibial baseplate with a 45 mm press-fit sleeve with a press-fit stem, 16 x 60 mm.  The knee was brought to extension with a size 5 insert.  Extruded cement was removed.  Once the cement had fully cured excessive cement was  removed throughout the knee and the final 5 mm insert to match the 6 femur was opened and placed in the knee.  Tourniquet was let down after 67 minutes.  Other than just scar tissue punctate bleeding and bleeding from the bone, there was no significant  hemostasis required.  We irrigated the knee again with normal saline solution.  Once this was completed, the knee dried, we sprinkled in about 3/4 of 1 vial of 1 gram of vancomycin powder.  The extensor mechanism was then reapproximated with the knee in  flexion.  The remaining vancomycin powder was sprinkled into the subcutaneous layer.  The subcutaneous layer was reapproximated using 2-0 Vicryl and the skin with a running Monocryl stitch.  The knee wound was then cleaned, dried and dressed sterilely  using surgical glue and Aquacel dressing.  He was then brought to the recovery room in stable condition, tolerated the procedure well.  Postoperatively, he will be admitted  for observation and pain control.  We will work to discharge over the next couple of days.   PUS D: 08/02/2020 1:29:41 pm T: 08/02/2020 4:49:00 pm  JOB: [id number]/ [id number]

## 2020-08-02 NOTE — Progress Notes (Signed)
AssistedDr. Foster with right, ultrasound guided, adductor canal block. Side rails up, monitors on throughout procedure. See vital signs in flow sheet. Tolerated Procedure well.  

## 2020-08-02 NOTE — Progress Notes (Signed)
Entering patient's room to do his assessment, he asked about pain meds.  I told him I would check to see what else he could get since he had gotten pain meds @ 1810.  I then got him some pain medicine and asked to scan his bracelet.  He said, " what are you gonna do about my pain medicine?"  I said, " I have your pain medicine right here, I just need to scan your bracelet.":  He offered no response, and, again, I asked for his bracelet.  He responded, " I don't know what you want me to do."  I explained to him that I needed to see his bracelet, that I didn't know which arm it was on, so I needed him to pull out whichever arm his ID bracelet was on.  The patient again offered no response so, I left the room.

## 2020-08-02 NOTE — Transfer of Care (Signed)
Immediate Anesthesia Transfer of Care Note  Patient: Mark [name]  Procedure(s) Performed: REIMPLANTATION/REVISION OF TOTAL KNEE WITH REMOVAL OF ANTIBIOTIC SPACER (Right Knee)  Patient Location: PACU  Anesthesia Type:GA combined with regional for post-op pain  Level of Consciousness: awake and patient cooperative  Airway & Oxygen Therapy: Patient Spontanous Breathing and Patient connected to face mask  Post-op Assessment: Report given to RN and Post -op Vital signs reviewed and stable  Post vital signs: Reviewed and stable  Last Vitals:  Vitals Value Taken Time  BP 154/74 08/02/20 1318  Temp    Pulse 95 08/02/20 1323  Resp 13 08/02/20 1323  SpO2 100 % 08/02/20 1323  Vitals shown include unvalidated device data.  Last Pain:  Vitals:   08/02/20 1020  TempSrc: Oral  PainSc: 0-No pain         Complications: No complications documented.

## 2020-08-02 NOTE — Brief Op Note (Signed)
08/02/2020  1:14 PM  PATIENT:  Mark [name]  65 y.o. male  PRE-OPERATIVE DIAGNOSIS:  Status post resection of an infected right total knee arthroplasty and placement of antibiotic spacer  POST-OPERATIVE DIAGNOSIS:  Status post resection of an infected right total knee arthroplasty and placement of antibiotic spacer  PROCEDURE:  Procedure(s) with comments: REIMPLANTATION/REVISION OF TOTAL KNEE WITH REMOVAL OF ANTIBIOTIC SPACER (Right) - 67mins  SURGEON:  Surgeon(s) and Role:    Paralee Cancel, MD - Primary  PHYSICIAN ASSISTANT: Griffith Citron, PA-C  ANESTHESIA:   regional and general  EBL:  50 mL   BLOOD ADMINISTERED:none  DRAINS: none   LOCAL MEDICATIONS USED:  MARCAINE plus Vancomycin powder. 1 gm  SPECIMEN:  No Specimen  DISPOSITION OF SPECIMEN:  N/A  COUNTS:  YES  TOURNIQUET:   Total Tourniquet Time Documented: Thigh (Left) - 67 minutes Total: Thigh (Left) - 67 minutes   DICTATION: .Other Dictation: Dictation Number [id number]  PLAN OF CARE: Admit to inpatient   PATIENT DISPOSITION:  PACU - hemodynamically stable.   Delay start of Pharmacological VTE agent (>24hrs) due to surgical blood loss or risk of bleeding: no

## 2020-08-02 NOTE — Discharge Instructions (Signed)

## 2020-08-02 NOTE — TOC Initial Note (Signed)
Transition of Care [facility]) - Initial/Assessment Note   Patient Details  Name: Mark [name] MRN: [id number] Date of Birth: Apr 09, 1956  Transition of Care [facility]) CM/SW Contact:    Sherie Don, LCSW Phone Number: 08/02/2020, 3:37 PM  Clinical Narrative: Patient is a 65 year old male who was admitted for right TKA reimplantation. CSW spoke with patient's wife, Jamie Belger, to review discharge plan. Per wife, patient will complete OPPT at [facility]. Patient does not have any DME needs as he has a rolling walker, BSC, and cane at home. TOC to follow.  Expected Discharge Plan: OP Rehab Barriers to Discharge: Continued Medical Work up  Patient Goals and CMS Choice Patient states their goals for this hospitalization and ongoing recovery are:: Discharge home with OPPT through [facility] CMS Medicare.gov Compare Post Acute Care list provided to:: Patient Choice offered to / list presented to : NA  Expected Discharge Plan and Services Expected Discharge Plan: OP Rehab In-house Referral: Clinical Social Work Post Acute Care Choice: NA Living arrangements for the past 2 months: Single Family Home             DME Arranged: N/A DME Agency: NA  Prior Living Arrangements/Services Living arrangements for the past 2 months: Single Family Home Lives with:: Spouse Patient language and need for interpreter reviewed:: Yes Do you feel safe going back to the place where you live?: Yes      Need for Family Participation in Patient Care: No (Comment) Care giver support system in place?: Yes (comment) Current home services: DME (Rolling walker, BSC, cane) Criminal Activity/Legal Involvement Pertinent to Current Situation/Hospitalization: No - Comment as needed  Activities of Daily Living ADL Screening (condition at time of admission) Patient's cognitive ability adequate to safely complete daily activities?: Yes Is the patient deaf or have difficulty hearing?: No Does the patient have  difficulty seeing, even when wearing glasses/contacts?: No Does the patient have difficulty concentrating, remembering, or making decisions?: No Patient able to express need for assistance with ADLs?: Yes Does the patient have difficulty dressing or bathing?: No Independently performs ADLs?: Yes (appropriate for developmental age) Weakness of Legs: Right Weakness of Arms/Hands: None  Emotional Assessment Orientation: : Oriented to Self,Oriented to Place,Oriented to  Time,Oriented to Situation Alcohol / Substance Use: Not Applicable Psych Involvement: No (comment)  Admission diagnosis:  S/P revision of total knee, right [Z96.651] Patient Active Problem List   Diagnosis Date Noted  . S/P right TKA reimplantation 08/02/2020  . Infection of total left knee replacement ([place]) 03/20/2020  . Cirrhosis of liver without ascites ([place])   . AKI (acute kidney injury) ([place])   . Bacteremia   . Spinal stenosis of lumbar region without neurogenic claudication   . Aspiration pneumonia ([place]) 09/24/2018  . Abscess in epidural space of lumbar spine   . MRSA bacteremia 09/21/2018  . Infection of prosthetic right knee joint ([place]) 09/20/2018  . Anemia of chronic disease 09/20/2018  . Hypoalbuminemia 09/20/2018  . Hypoglycemia without diagnosis of diabetes mellitus 09/20/2018  . Epidural abscess 09/20/2018  . Hyperkalemia 05/18/2018  . Edentulous 05/11/2018  . Pancytopenia ([place]) 05/10/2018  . CAD (coronary artery disease) 05/10/2018  . Hepatic encephalopathy ([place]) 05/10/2018  . Alcohol abuse 05/10/2018  . GERD (gastroesophageal reflux disease) 05/10/2018  . Duodenal ulcer   . Renal failure   . Acute on chronic anemia   . Hypotension 06/17/2017  . Hyponatremia 06/17/2017  . Thrombocytopenia ([place]) 06/17/2017  . Leg edema, right 06/17/2017  . Acute  metabolic encephalopathy 50/75/7322  . Anemia, iron deficiency   . Benign neoplasm of ascending colon   . Hemorrhoids   . Portal hypertensive  gastropathy ([place])   . Gastritis and gastroduodenitis   . Esophageal varices without bleeding ([place])   . H/O: CVA (cerebrovascular accident) 12/01/2013  . ACS (acute coronary syndrome) ([place]) 12/01/2013  . Anasarca 12/01/2013  . Polysubstance abuse ([place]) 12/01/2013  . CKD (chronic kidney disease) stage 3, GFR 30-59 ml/min ([name]) 12/01/2013  . S/P right TKA 05/02/2013  . Murmur 04/14/2013  . Right inguinal hernia 12/26/2010  . Cirrhosis, alcoholic ([place]) 56/72/0919   PCP:  [name], MD Pharmacy:   [facility] [place], [place] - [address] [place] [address] Phone: [phone] Fax: [facility] [place] [place] [address] Phone: [phone] Fax: [phone]  Readmission Risk Interventions Readmission Risk Prevention Plan 10/03/2018  Transportation Screening Complete  Medication Review (RN Care Manager) Complete  PCP or Specialist appointment within 3-5 days of discharge Complete  HRI or [place] Not Complete  HRI or Home Care Consult Pt Refusal Comments Pt stated he wanted to go home.  Did not discuss discharging with Home Health services.  SW Recovery Care/Counseling Consult Complete  Palliative Care Screening Not [place] Patient Refused  Some recent data might be hidden

## 2020-08-02 NOTE — Interval H&P Note (Signed)
History and Physical Interval Note:  08/02/2020 10:35 AM  Mark [name]  has presented today for surgery, with the diagnosis of Status post Right total knee arthroplasty and placement of antibiotic spacer.  The various methods of treatment have been discussed with the patient and family. After consideration of risks, benefits and other options for treatment, the patient has consented to  Procedure(s) with comments: REIMPLANTATION OF TOTAL KNEE WITH REMOVAL OF ANTIBIOTIC SPACER (Right) - 50mins as a surgical intervention.  The patient's history has been reviewed, patient examined, no change in status, stable for surgery.  I have reviewed the patient's chart and labs.  Questions were answered to the patient's satisfaction.     Mauri Pole

## 2020-08-02 NOTE — Anesthesia Postprocedure Evaluation (Signed)
Anesthesia Post Note  Patient: Mark [name]  Procedure(s) Performed: REIMPLANTATION/REVISION OF TOTAL KNEE WITH REMOVAL OF ANTIBIOTIC SPACER (Right Knee)     Patient location during evaluation: PACU Anesthesia Type: General and Regional Level of consciousness: awake and alert Pain management: pain level controlled Vital Signs Assessment: post-procedure vital signs reviewed and stable Respiratory status: spontaneous breathing, nonlabored ventilation, respiratory function stable and patient connected to nasal cannula oxygen Cardiovascular status: blood pressure returned to baseline and stable Postop Assessment: no apparent nausea or vomiting Anesthetic complications: no   No complications documented.  Last Vitals:  Vitals:   08/02/20 1445 08/02/20 1508  BP: (!) 157/67 (!) 162/72  Pulse: 65 65  Resp: 14 18  Temp:    SpO2: 100% 100%    Last Pain:  Vitals:   08/02/20 1445  TempSrc:   PainSc: [place]

## 2020-08-02 NOTE — Anesthesia Preprocedure Evaluation (Addendum)
Anesthesia Evaluation  Patient identified by MRN, date of birth, ID band Patient awake    Reviewed: Allergy & Precautions, NPO status , Patient's Chart, lab work & pertinent test results, reviewed documented beta blocker date and time   Airway Mallampati: II  TM Distance: >3 FB Neck ROM: Full    Dental  (+) Edentulous Upper, Edentulous Lower   Pulmonary pneumonia, resolved, former smoker,    Pulmonary exam normal breath sounds clear to auscultation       Cardiovascular hypertension, Pt. on medications and Pt. on home beta blockers + CAD and + Past MI  Normal cardiovascular exam+ Valvular Problems/Murmurs  Rhythm:Regular Rate:Normal  Echo 09/22/18 1. The left ventricle has normal systolic function with an ejection fraction of 60-65%. The cavity size was normal. There is mildly increased left ventricular wall thickness. Left ventricular diastolic parameters were normal.  2. The right ventricle has normal systolic function. The cavity was normal. There is no increase in right ventricular wall thickness.  3. Left atrial size was severely dilated.  4. No evidence of mitral valve stenosis.  5. Mild thickening of the aortic valve. Mild calcification of the aortic valve. No stenosis of the aortic valve.   EKG 03/13/20 NSR, septal infarct   Neuro/Psych PSYCHIATRIC DISORDERS CVA, No Residual Symptoms    GI/Hepatic PUD, GERD  Medicated and Controlled,(+) Cirrhosis   Esophageal Varices and ascites  substance abuse  alcohol use,   Endo/Other  negative endocrine ROS  Renal/GU Renal InsufficiencyRenal disease  negative genitourinary   Musculoskeletal  (+) Arthritis , Osteoarthritis,  Hx/o epidural abscess 08/2019 Infected Right TKR with removal and placement of Abx spacers   Abdominal   Peds  Hematology  (+) anemia , Hx/o thrombocytopenia   Anesthesia Other Findings   Reproductive/Obstetrics                             Anesthesia Physical Anesthesia Plan  ASA: III  Anesthesia Plan: General   Post-op Pain Management:  Regional for Post-op pain   Induction: Intravenous  PONV Risk Score and Plan: 3 and Treatment may vary due to age or medical condition, Ondansetron, Dexamethasone and Midazolam  Airway Management Planned: Oral ETT and LMA  Additional Equipment:   Intra-op Plan:   Post-operative Plan: Extubation in OR  Informed Consent: I have reviewed the patients History and Physical, chart, labs and discussed the procedure including the risks, benefits and alternatives for the proposed anesthesia with the patient or authorized representative who has indicated his/her understanding and acceptance.     Dental advisory given  Plan Discussed with: CRNA and Anesthesiologist  Anesthesia Plan Comments:         Anesthesia Quick Evaluation

## 2020-08-02 NOTE — Anesthesia Procedure Notes (Signed)
Anesthesia Regional Block: Adductor canal block   Pre-Anesthetic Checklist: ,, timeout performed, Correct Patient, Correct Site, Correct Laterality, Correct Procedure, Correct Position, site marked, Risks and benefits discussed,  Surgical consent,  Pre-op evaluation,  At surgeon's request and post-op pain management  Laterality: Right  Prep: chloraprep       Needles:  Injection technique: Single-shot  Needle Type: Echogenic Stimulator Needle     Needle Length: 9cm  Needle Gauge: 21   Needle insertion depth: 5 cm   Additional Needles:   Procedures:,,,, ultrasound used (permanent image in chart),,,,  Narrative:  Start time: 08/02/2020 10:15 AM End time: 08/02/2020 10:20 AM Injection made incrementally with aspirations every 5 mL.  Performed by: Personally  Anesthesiologist: Josephine Igo, MD  Additional Notes: Timeout performed. Patient sedated. Relevant anatomy ID'd using [place]. Incremental 2-25ml injection of LA with frequent aspiration. Patient tolerated procedure well.        Right Adductor Canal Block

## 2020-08-03 ENCOUNTER — Encounter (HOSPITAL_COMMUNITY): Payer: Self-pay | Admitting: Orthopedic Surgery

## 2020-08-03 LAB — BASIC METABOLIC PANEL
Anion gap: 5 (ref 5–15)
BUN: 18 mg/dL (ref 8–23)
CO2: 20 mmol/L — ABNORMAL LOW (ref 22–32)
Calcium: 7.5 mg/dL — ABNORMAL LOW (ref 8.9–10.3)
Chloride: 116 mmol/L — ABNORMAL HIGH (ref 98–111)
Creatinine, Ser: 1.36 mg/dL — ABNORMAL HIGH (ref 0.61–1.24)
GFR, Estimated: 57 mL/min — ABNORMAL LOW (ref >60)
Glucose, Bld: 145 mg/dL — ABNORMAL HIGH (ref 70–99)
Potassium: 4.6 mmol/L (ref 3.5–5.1)
Sodium: 141 mmol/L (ref 135–145)

## 2020-08-03 LAB — CBC
HCT: 17.6 % — ABNORMAL LOW (ref 39.0–52.0)
Hemoglobin: 5.7 g/dL — CL (ref 13.0–17.0)
MCH: 31.3 pg (ref 26.0–34.0)
MCHC: 32.4 g/dL (ref 30.0–36.0)
MCV: 96.7 fL (ref 80.0–100.0)
Platelets: 82 10*3/uL — ABNORMAL LOW (ref 150–400)
RBC: 1.82 MIL/uL — ABNORMAL LOW (ref 4.22–5.81)
RDW: 14.6 % (ref 11.5–15.5)
WBC: 2.3 10*3/uL — ABNORMAL LOW (ref 4.0–10.5)
nRBC: 0 % (ref 0.0–0.2)

## 2020-08-03 MED ORDER — SODIUM CHLORIDE 0.9% IV SOLUTION: Freq: Once | INTRAVENOUS | Status: DC

## 2020-08-03 NOTE — Plan of Care (Signed)
  Problem: Education: Goal: Knowledge of General Education information will improve Description Including pain rating scale, medication(s)/side effects and non-pharmacologic comfort measures Outcome: Progressing   Problem: Clinical Measurements: Goal: Will remain free from infection Outcome: Progressing   Problem: Activity: Goal: Risk for activity intolerance will decrease Outcome: Progressing   Problem: Coping: Goal: Level of anxiety will decrease Outcome: Progressing   Problem: Pain Managment: Goal: General experience of comfort will improve Outcome: Progressing   Problem: Safety: Goal: Ability to remain free from injury will improve Outcome: Progressing   

## 2020-08-03 NOTE — Progress Notes (Signed)
Date and time results received: 08/03/20 0420 (use smartphrase ".now" to insert current time)  Test: Hgb:  Critical Value: 5.7  Name of Provider Notified:Justin [place], [place]  Orders Received? Or Actions Taken?: Yes.  See MD orders

## 2020-08-03 NOTE — Progress Notes (Signed)
Physical Therapy Treatment Patient Details Name: Mark [name] MRN: [id number] DOB: Dec 22, 1955 Today's Date: 08/03/2020    History of Present Illness Pt s/p reimplantation of R knee following antibiotic spacer.  Pt wtih hx of CVA, MI, and ETOH abuse    PT Comments    Pt moving well and with good follow through on [facility] but with pain increasing.  Pt assisted from chair to ambulate short distance and transfer to bed.  R LE positioned to promote extension and with ice packs in place. RN advised of rising pain level.  Follow Up Recommendations  Follow surgeon's recommendation for DC plan and follow-up therapies     Equipment Recommendations  None recommended by PT    Recommendations for Other Services       Precautions / Restrictions Precautions Precautions: Knee;Fall Restrictions Weight Bearing Restrictions: Yes Other Position/Activity Restrictions: 50% PWB R LE    Mobility  Bed Mobility Overal bed mobility: Needs Assistance Bed Mobility: Sit to Supine     Supine to sit: Min assist Sit to supine: Min assist   General bed mobility comments: Increased time with assist to manage R LE    Transfers Overall transfer level: Needs assistance Equipment used: Rolling walker (2 wheeled) Transfers: Sit to/from Stand Sit to Stand: Min guard         General transfer comment: cues for LE management and use of UEs to self assist  Ambulation/Gait Ambulation/Gait assistance: Min assist Gait Distance (Feet): 10 Feet Assistive device: Rolling walker (2 wheeled) Gait Pattern/deviations: Step-to pattern;Decreased step length - right;Decreased step length - left;Shuffle;Trunk flexed Gait velocity: decr   General Gait Details: cues for sequence, posture and position from RW; Good follow through with [facility]   Stairs             Wheelchair Mobility    Modified Rankin (Stroke Patients Only)       Balance Overall balance assessment: Mild deficits observed, not formally  tested                                          Cognition Arousal/Alertness: Awake/alert Behavior During Therapy: WFL for tasks assessed/performed Overall Cognitive Status: Within Functional Limits for tasks assessed                                        Exercises Total Joint Exercises Ankle Circles/Pumps: AROM;Both;15 reps;Supine Quad Sets: AROM;Both;5 reps;Supine Heel Slides: AAROM;Right;10 reps;Supine Straight Leg Raises: AAROM;Right;10 reps;Supine    General Comments        Pertinent Vitals/Pain Pain Assessment: 0-10 Pain Score: 5  Pain Location: R knee Pain Descriptors / Indicators: Aching;Grimacing Pain Intervention(s): Limited activity within patient's tolerance;Monitored during session;Premedicated before session;Ice applied    Home Living Family/patient expects to be discharged to:: Private residence Living Arrangements: Spouse/significant other Available Help at Discharge: Family Type of Home: House Home Access: Stairs to enter Entrance Stairs-Rails: None Home Layout: One level Home Equipment: Environmental consultant - 2 wheels;Cane - single point      Prior Function Level of Independence: Independent with assistive device(s)      Comments: RW   PT Goals (current goals can now be found in the care plan section) Acute Rehab PT Goals Patient Stated Goal: Regain IND PT Goal Formulation: With patient Time For Goal Achievement: 08/10/20  Potential to Achieve Goals: Good Progress towards PT goals: Progressing toward goals    Frequency    7X/week      PT Plan Current plan remains appropriate    Co-evaluation              AM-PAC PT "6 Clicks" Mobility   Outcome Measure  Help needed turning from your back to your side while in a flat bed without using bedrails?: A Little Help needed moving from lying on your back to sitting on the side of a flat bed without using bedrails?: A Little Help needed moving to and from a bed to a  chair (including a wheelchair)?: A Little Help needed standing up from a chair using your arms (e.g., wheelchair or bedside chair)?: A Little Help needed to walk in hospital room?: A Little Help needed climbing 3-5 steps with a railing? : A Lot 6 Click Score: 17    End of Session Equipment Utilized During Treatment: Gait belt Activity Tolerance: Patient tolerated treatment well Patient left: in bed;with call bell/phone within reach;with nursing/sitter in room Nurse Communication: Mobility status PT Visit Diagnosis: Difficulty in walking, not elsewhere classified (R26.2);Pain Pain - Right/Left: Right Pain - part of body: Knee     Time: 7371-0626 PT Time Calculation (min) (ACUTE ONLY): 21 min  Charges:  $Gait Training: 8-22 mins $Therapeutic Activity: 8-22 mins                     [name] PT Acute Rehabilitation Services Pager [phone] Office [phone]    Rashi Giuliani 08/03/2020, 5:05 PM

## 2020-08-03 NOTE — Progress Notes (Addendum)
Subjective: 1 Day Post-Op Procedure(s) (LRB): REIMPLANTATION/REVISION OF TOTAL KNEE WITH REMOVAL OF ANTIBIOTIC SPACER (Right) Patient reports pain as mild. Patient seen in rounds for Dr. Alvan Dame. Patient is resting in bed on exam this morning. Hemoglobin of 5.7 this AM. Blood was ordered, but the patient had concerns about bloodborne pathogens which we discussed. His goal is to get home as soon as he can. Foley catheter removed.  We will start therapy today.   Objective: Vital signs in last 24 hours: Temp:  [97.3 F (36.3 C)-98.5 F (36.9 C)] 98.5 F (36.9 C) (04/08 0542) Pulse Rate:  [63-99] 81 (04/08 0542) Resp:  [8-21] 16 (04/08 0542) BP: (114-170)/(48-85) 114/64 (04/08 0542) SpO2:  [100 %] 100 % (04/08 0542)  Intake/Output from previous day:  Intake/Output Summary (Last 24 hours) at 08/03/2020 0803 Last data filed at 08/03/2020 0600 Gross per 24 hour  Intake 3243.62 ml  Output 1850 ml  Net 1393.62 ml     Intake/Output this shift: No intake/output data recorded.  Labs: Recent Labs    08/02/20 0806 08/03/20 0256  HGB 7.9* 5.7*   Recent Labs    08/02/20 0806 08/03/20 0256  WBC 2.2* 2.3*  RBC 2.40* 1.82*  HCT 23.4* 17.6*  PLT 115* 82*   Recent Labs    08/02/20 0806 08/03/20 0256  NA 135 141  K 4.5 4.6  CL 111 116*  CO2 18* 20*  BUN 15 18  CREATININE 1.22 1.36*  GLUCOSE 87 145*  CALCIUM 8.2* 7.5*   Recent Labs    08/02/20 0806  INR 1.3*    Exam: General - Patient is Alert and Oriented Extremity - Neurologically intact Sensation intact distally Intact pulses distally Dorsiflexion/Plantar flexion intact Dressing - dressing C/D/I Motor Function - intact, moving foot and toes well on exam.   Past Medical History:  Diagnosis Date  . Alcohol abuse   . Anemia   . Arthritis   . Ascites   . Cirrhosis ([place])   . Coffee ground emesis   . Dehydration 06/17/2017  . Febrile illness   . Heart murmur   . History of blood transfusion   . Hyperlipidemia    . Hypertension   . Leg swelling   . Myocardial infarction ([place]) 2012  . Preop cardiovascular exam 04/14/2013  . Sepsis ([place]) 06/17/2017  . Septic shock ([place]) 06/18/2017  . SIRS (systemic inflammatory response syndrome) ([place]) 07/11/2017  . Stroke [facility]) 2012   no deficits  . Thrombocytopenia ([name])     Assessment/Plan: 1 Day Post-Op Procedure(s) (LRB): REIMPLANTATION/REVISION OF TOTAL KNEE WITH REMOVAL OF ANTIBIOTIC SPACER (Right) Active Problems:   S/P right TKA reimplantation  Estimated body mass index is 20.36 kg/m as calculated from the following:   Height as of this encounter: 5\' 7"  (1.702 m).   Weight as of this encounter: 59 kg. Advance diet Up with therapy   DVT Prophylaxis - Aspirin PWB RLE 50%  Hemoglobin of 5.7 this AM, down from 7.9 preoperatively. He is chronically anemic, with ABLA now. We had a discussion regarding therapy with iron vs blood transfusion. Patient had concerns about bloodborne pathogens, and we discussed the measures taken to avoid this. His goal is to get home as soon as possible, and we discussed that blood transfusion will improve his hemoglobin much quicker than iron will. He is in agreement to receive 2 units PRBCs.   Plan is to go Home after hospital stay. Plan to get up with PT after transfusion.   Griffith Citron,  PA-C Orthopedic Surgery [phone] 08/03/2020, 8:03 AM

## 2020-08-03 NOTE — Evaluation (Signed)
Physical Therapy Evaluation Patient Details Name: Mark [name] MRN: [id number] DOB: December 08, 1955 Today's Date: 08/03/2020   History of Present Illness  Pt s/p reimplantation of R knee following antibiotic spacer.  Pt wtih hx of CVA, MI, and ETOH abuse  Clinical Impression  Pt admitted as above and presenting with functional mobility limitations 2* decreased R LE strength/ROM, post op pain and PWB on R.  Pt should progress to dc home with family assist.    Follow Up Recommendations Follow surgeon's recommendation for DC plan and follow-up therapies    Equipment Recommendations  None recommended by PT    Recommendations for Other Services       Precautions / Restrictions Precautions Precautions: Knee;Fall Restrictions Weight Bearing Restrictions: Yes Other Position/Activity Restrictions: 50% PWB R LE      Mobility  Bed Mobility Overal bed mobility: Needs Assistance Bed Mobility: Supine to Sit     Supine to sit: Min assist     General bed mobility comments: Increased time with assist to manage R LE    Transfers Overall transfer level: Needs assistance Equipment used: Rolling walker (2 wheeled) Transfers: Sit to/from Stand Sit to Stand: Min assist;From elevated surface         General transfer comment: cues for LE management and use of UEs to self assist  Ambulation/Gait Ambulation/Gait assistance: Min assist Gait Distance (Feet): 75 Feet Assistive device: Rolling walker (2 wheeled) Gait Pattern/deviations: Step-to pattern;Decreased step length - right;Decreased step length - left;Shuffle;Trunk flexed Gait velocity: decr   General Gait Details: cues for sequence, posture and position from RW; Good follow through with [facility]  Stairs            Wheelchair Mobility    Modified Rankin (Stroke Patients Only)       Balance Overall balance assessment: Mild deficits observed, not formally tested                                            Pertinent Vitals/Pain Pain Assessment: 0-10 Pain Score: 5  Pain Location: R knee Pain Descriptors / Indicators: Aching;Grimacing Pain Intervention(s): Limited activity within patient's tolerance;Monitored during session;Premedicated before session;Ice applied    Home Living Family/patient expects to be discharged to:: Private residence Living Arrangements: Spouse/significant other Available Help at Discharge: Family Type of Home: House Home Access: Stairs to enter Entrance Stairs-Rails: None Entrance Stairs-Number of Steps: 2 Home Layout: One level Home Equipment: Environmental consultant - 2 wheels;Cane - single point      Prior Function Level of Independence: Independent with assistive device(s)         Comments: RW     Hand Dominance        Extremity/Trunk Assessment   Upper Extremity Assessment Upper Extremity Assessment: Overall WFL for tasks assessed    Lower Extremity Assessment Lower Extremity Assessment: RLE deficits/detail RLE Deficits / Details: 2+/5 quads with AAROM at knee -10 - 50    Cervical / Trunk Assessment Cervical / Trunk Assessment: Normal  Communication   Communication: No difficulties  Cognition Arousal/Alertness: Awake/alert Behavior During Therapy: WFL for tasks assessed/performed Overall Cognitive Status: Within Functional Limits for tasks assessed                                        General Comments  Exercises Total Joint Exercises Ankle Circles/Pumps: AROM;Both;15 reps;Supine Quad Sets: AROM;Both;5 reps;Supine Heel Slides: AAROM;Right;10 reps;Supine Straight Leg Raises: AAROM;Right;10 reps;Supine   Assessment/Plan    PT Assessment Patient needs continued PT services  PT Problem List Decreased strength;Decreased range of motion;Decreased activity tolerance;Decreased balance;Decreased mobility;Decreased knowledge of use of DME;Pain       PT Treatment Interventions DME instruction;Gait training;Stair  training;Functional mobility training;Therapeutic activities;Therapeutic exercise;Patient/family education    PT Goals (Current goals can be found in the Care Plan section)  Acute Rehab PT Goals Patient Stated Goal: Regain IND PT Goal Formulation: With patient Time For Goal Achievement: 08/10/20 Potential to Achieve Goals: Good    Frequency 7X/week   Barriers to discharge        Co-evaluation               AM-PAC PT "6 Clicks" Mobility  Outcome Measure Help needed turning from your back to your side while in a flat bed without using bedrails?: A Little Help needed moving from lying on your back to sitting on the side of a flat bed without using bedrails?: A Little Help needed moving to and from a bed to a chair (including a wheelchair)?: A Little Help needed standing up from a chair using your arms (e.g., wheelchair or bedside chair)?: A Little Help needed to walk in hospital room?: A Little Help needed climbing 3-5 steps with a railing? : A Lot 6 Click Score: 17    End of Session Equipment Utilized During Treatment: Gait belt Activity Tolerance: Patient tolerated treatment well Patient left: in chair;with call bell/phone within reach;with chair alarm set Nurse Communication: Mobility status PT Visit Diagnosis: Difficulty in walking, not elsewhere classified (R26.2);Pain Pain - Right/Left: Right Pain - part of body: Knee    Time: 8099-8338 PT Time Calculation (min) (ACUTE ONLY): 27 min   Charges:   PT Evaluation $PT Eval Low Complexity: 1 Low PT Treatments $Gait Training: 8-22 mins        [name] PT Acute Rehabilitation Services Pager [phone] Office [phone]   Kayon Dozier 08/03/2020, 4:57 PM

## 2020-08-04 LAB — CBC
HCT: 25.4 % — ABNORMAL LOW (ref 39.0–52.0)
Hemoglobin: 8.8 g/dL — ABNORMAL LOW (ref 13.0–17.0)
MCH: 30.7 pg (ref 26.0–34.0)
MCHC: 34.6 g/dL (ref 30.0–36.0)
MCV: 88.5 fL (ref 80.0–100.0)
Platelets: 107 10*3/uL — ABNORMAL LOW (ref 150–400)
RBC: 2.87 MIL/uL — ABNORMAL LOW (ref 4.22–5.81)
RDW: 18.2 % — ABNORMAL HIGH (ref 11.5–15.5)
WBC: 5.6 10*3/uL (ref 4.0–10.5)
nRBC: 0 % (ref 0.0–0.2)

## 2020-08-04 LAB — TYPE AND SCREEN
ABO/RH(D): B POS
Antibody Screen: NEGATIVE
Unit division: 0
Unit division: 0

## 2020-08-04 LAB — BPAM RBC
Blood Product Expiration Date: 202204142359
Blood Product Expiration Date: 202204272359
ISSUE DATE / TIME: 202204081059
ISSUE DATE / TIME: 202204081650
Unit Type and Rh: 1700
Unit Type and Rh: 1700

## 2020-08-04 MED ORDER — FERROUS SULFATE 325 (65 FE) MG PO TABS: 325.0000 mg | ORAL_TABLET | Freq: Three times a day (TID) | ORAL | 0 refills | Status: DC

## 2020-08-04 MED ORDER — ASPIRIN 81 MG PO CHEW: 81.0000 mg | CHEWABLE_TABLET | Freq: Two times a day (BID) | ORAL | 0 refills | Status: DC

## 2020-08-04 MED ORDER — OXYCODONE HCL 5 MG PO TABS: 5.0000 mg | ORAL_TABLET | ORAL | 0 refills | Status: DC | PRN

## 2020-08-04 MED ORDER — POLYETHYLENE GLYCOL 3350 17 G PO PACK: 17.0000 g | PACK | Freq: Every day | ORAL | 0 refills | Status: DC | PRN

## 2020-08-04 MED ORDER — METHOCARBAMOL 500 MG PO TABS: 500.0000 mg | ORAL_TABLET | Freq: Four times a day (QID) | ORAL | 0 refills | Status: DC | PRN

## 2020-08-04 NOTE — Plan of Care (Signed)
Plan of care reviewed and discussed with the patient. 

## 2020-08-04 NOTE — Progress Notes (Signed)
PT Cancellation Note  Patient Details Name: Mark [name] MRN: [id number] DOB: 06-24-1955   Cancelled Treatment:     PT informed buy RN that pt states he is going home, he has no interest in participating with PT this morning and he is calling his ride.  PT attempted to speak with pt but pt on phone attempting to get outside line and not engaging PT.  Re-attempted 30 minutes later but pt had been discharged.   Alesa Echevarria 08/04/2020, 11:48 AM

## 2020-08-04 NOTE — Plan of Care (Signed)
Pt stable at this time. Pt to d/c home when nursing staff is ready.

## 2020-08-04 NOTE — Progress Notes (Signed)
   Subjective:  Patient reports pain as mild.  Pain well controlled this morning.  No complaints of shortness of breath or chest pain following 2 units packed red blood cells yesterday.  Up with therapy and doing well.  Ready to DC home today.  Objective:   VITALS:   Vitals:   08/03/20 1714 08/03/20 1718 08/03/20 2033 08/04/20 0459  BP:  (!) 164/67 (!) 172/77 (!) 174/82  Pulse:  67 70 80  Resp:  16 16 16   Temp: 98.6 F (37 C) 98.6 F (37 C) 98.3 F (36.8 C) 98 F (36.7 C)  TempSrc: Oral Oral Oral   SpO2:  100% 100% 100%  Weight:      Height:        Neurologically intact Neurovascular intact Sensation intact distally Dorsiflexion/Plantar flexion intact Incision: dressing C/D/I Compartment soft   Lab Results  Component Value Date   WBC 5.6 08/04/2020   HGB 8.8 (L) 08/04/2020   HCT 25.4 (L) 08/04/2020   MCV 88.5 08/04/2020   PLT 107 (L) 08/04/2020   BMET    Component Value Date/Time   NA 141 08/03/2020 0256   K 4.6 08/03/2020 0256   CL 116 (H) 08/03/2020 0256   CO2 20 (L) 08/03/2020 0256   GLUCOSE 145 (H) 08/03/2020 0256   BUN 18 08/03/2020 0256   CREATININE 1.36 (H) 08/03/2020 0256   CREATININE 1.84 (H) 11/15/2018 1533   CALCIUM 7.5 (L) 08/03/2020 0256   GFRNONAA 57 (L) 08/03/2020 0256   GFRAA 31 (L) 12/24/2018 0831     Assessment/Plan: 2 Days Post-Op   Active Problems:   S/P right TKA reimplantation  REIMPLANTATION/REVISION OF TOTAL KNEE WITH REMOVAL OF ANTIBIOTIC SPACER (Right) Active Problems:   S/P right TKA reimplantation  Estimated body mass index is 20.36 kg/m as calculated from the following:   Height as of this encounter: 5\' 7"  (1.702 m).   Weight as of this encounter: 59 kg. Advance diet Up with therapy   DVT Prophylaxis - Aspirin PWB RLE 50%  Hemoglobin of 8.8 this AM, following 2 units of packed red blood cell. He is chronically anemic, with ABLA now  Plan for discharge home today.  Nicholes Stairs 08/04/2020, 8:28  AM   Geralynn Rile, MD [phone]

## 2020-08-04 NOTE — Progress Notes (Signed)
Pt stable at time of discharge instructions and educations. He understood partial weight bearing status on operative leg. Pt refused physical therapy this am prior to d/c. Pt also refused po antibiotic, stating he would take hit at home. He stated, I want to go home soon. Rn will continue to monitor until pt ride arrives.

## 2020-08-13 NOTE — Discharge Summary (Signed)
Physician Discharge Summary   Patient ID: Mark [name] MRN: [id number] DOB/AGE: 67-Dec-1957 67 y.o.  Admit date: 08/02/2020 Discharge date: 08/04/2020  Primary Diagnosis: History of right total knee arthroplasty complicated by infection requiring resection and placement of antibiotic spacer.  Admission Diagnoses:  Past Medical History:  Diagnosis Date  . Alcohol abuse   . Anemia   . Arthritis   . Ascites   . Cirrhosis ([place])   . Coffee ground emesis   . Dehydration 06/17/2017  . Febrile illness   . Heart murmur   . History of blood transfusion   . Hyperlipidemia   . Hypertension   . Leg swelling   . Myocardial infarction ([place]) 2012  . Preop cardiovascular exam 04/14/2013  . Sepsis ([place]) 06/17/2017  . Septic shock ([place]) 06/18/2017  . SIRS (systemic inflammatory response syndrome) ([place]) 07/11/2017  . Stroke [facility]) 2012   no deficits  . Thrombocytopenia ([place])    Discharge Diagnoses:   Active Problems:   S/P right TKA reimplantation  Estimated body mass index is 20.36 kg/m as calculated from the following:   Height as of this encounter: 5\' 7"  (1.702 m).   Weight as of this encounter: 59 kg.  Procedure:  Procedure(s) (LRB): REIMPLANTATION/REVISION OF TOTAL KNEE WITH REMOVAL OF ANTIBIOTIC SPACER (Right)   Consults: ID  HPI: The patient is a 65 year old male with a remote history of right total knee arthroplasty approximately 10 years ago.  He presented to the office and over the last year with increasing pain and swelling.  Workup in the office indicated  infection.  He is now approximately 3 months after resection of the total knee and placement of antibiotic spacer with IV antibiotics for 6 weeks.  He presented to the office.  Lab work had normalized and at this point, he was ready to proceed with total  knee replacement.  We reviewed the risk of recurrence of infection, DVT, stiffness and need for future surgeries.  We reviewed the postoperative course and expectations.   Consent was obtained for the benefit of pain relief.  Laboratory Data: Admission on 08/02/2020, Discharged on 08/04/2020  Component Date Value Ref Range Status  . SARS Coronavirus 2 08/02/2020 NEGATIVE  NEGATIVE Final   Comment: (NOTE) SARS-CoV-2 target nucleic acids are NOT DETECTED.  The SARS-CoV-2 RNA is generally detectable in upper and lower respiratory specimens during the acute phase of infection. The lowest concentration of SARS-CoV-2 viral copies this assay can detect is 250 copies / mL. A negative result does not preclude SARS-CoV-2 infection and should not be used as the sole basis for treatment or other patient management decisions.  A negative result may occur with improper specimen collection / handling, submission of specimen other than nasopharyngeal swab, presence of viral mutation(s) within the areas targeted by this assay, and inadequate number of viral copies (<250 copies / mL). A negative result must be combined with clinical observations, patient history, and epidemiological information.  Fact Sheet for Patients:   StrictlyIdeas.no  Fact Sheet for Healthcare Providers: BankingDealers.co.za  This test is not yet approved or                           cleared by the [place] FDA and has been authorized for detection and/or diagnosis of SARS-CoV-2 by FDA under an Emergency Use Authorization (EUA).  This EUA will remain in effect (meaning this test can be used) for the duration of the COVID-19 declaration under Section  564(b)(1) of the Act, 21 U.S.C. section 360bbb-3(b)(1), unless the authorization is terminated or revoked sooner.  Performed at [facility], [place] [address], [place], [place] [address]   . MRSA, PCR 08/02/2020 NEGATIVE  NEGATIVE Final  . Staphylococcus aureus 08/02/2020 NEGATIVE  NEGATIVE Final   Comment: (NOTE) The Xpert SA Assay (FDA approved for NASAL specimens in  patients 63 years of age and older), is one component of a comprehensive surveillance program. It is not intended to diagnose infection nor to guide or monitor treatment. Performed at [facility], [place] [address], [place], [place] [address]   . WBC 08/02/2020 2.2* 4.0 - 10.5 K/uL Final  . RBC 08/02/2020 2.40* 4.22 - 5.81 MIL/uL Final  . Hemoglobin 08/02/2020 7.9* 13.0 - 17.0 g/dL Final  . HCT 08/02/2020 23.4* 39.0 - 52.0 % Final  . MCV 08/02/2020 97.5  80.0 - 100.0 fL Final  . MCH 08/02/2020 32.9  26.0 - 34.0 pg Final  . MCHC 08/02/2020 33.8  30.0 - 36.0 g/dL Final  . RDW 08/02/2020 14.8  11.5 - 15.5 % Final  . Platelets 08/02/2020 115* 150 - 400 K/uL Final   Comment: SPECIMEN CHECKED FOR CLOTS REPEATED TO VERIFY PLATELET COUNT CONFIRMED BY SMEAR   . nRBC 08/02/2020 0.0  0.0 - 0.2 % Final   Performed at [place] [address], [place], [place] [address]  . Sodium 08/02/2020 135  135 - 145 mmol/L Final  . Potassium 08/02/2020 4.5  3.5 - 5.1 mmol/L Final  . Chloride 08/02/2020 111  98 - 111 mmol/L Final  . CO2 08/02/2020 18* 22 - 32 mmol/L Final  . Glucose, Bld 08/02/2020 87  70 - 99 mg/dL Final   Glucose reference range applies only to samples taken after fasting for at least 8 hours.  . BUN 08/02/2020 15  8 - 23 mg/dL Final  . Creatinine, Ser 08/02/2020 1.22  0.61 - 1.24 mg/dL Final  . Calcium 08/02/2020 8.2* 8.9 - 10.3 mg/dL Final  . Total Protein 08/02/2020 7.1  6.5 - 8.1 g/dL Final  . Albumin 08/02/2020 3.0* 3.5 - 5.0 g/dL Final  . AST 08/02/2020 25  15 - 41 U/L Final  . ALT 08/02/2020 11  0 - 44 U/L Final  . Alkaline Phosphatase 08/02/2020 106  38 - 126 U/L Final  . Total Bilirubin 08/02/2020 0.8  0.3 - 1.2 mg/dL Final  . GFR, Estimated 08/02/2020 >60  >60 mL/min Final   Comment: (NOTE) Calculated using the CKD-EPI Creatinine Equation (2021)   . Anion gap 08/02/2020 6  5 - 15 Final   Performed at [facility],  [place] [address] [place], [place] [address]  . Prothrombin Time 08/02/2020 15.8* 11.4 - 15.2 seconds Final  . INR 08/02/2020 1.3* 0.8 - 1.2 Final   Comment: (NOTE) INR goal varies based on device and disease states. Performed at [facility], [place] [address] [place], [place] [address]   . aPTT 08/02/2020 48* 24 - 36 seconds Final   Comment:        IF BASELINE aPTT IS ELEVATED, SUGGEST PATIENT RISK ASSESSMENT BE USED TO DETERMINE APPROPRIATE ANTICOAGULANT THERAPY. Performed at [facility], [place] [address] [place], [place] [address]   . ABO/RH(D) 08/02/2020 B POS   Final  . Antibody Screen 08/02/2020 NEG   Final  . Sample Expiration 08/02/2020 08/05/2020,2359   Final  . Unit Number 08/02/2020 [id number]   Final  . Blood  Component Type 08/02/2020 RED CELLS,LR   Final  . Unit division 08/02/2020 00   Final  . Status of Unit 08/02/2020 ISSUED,FINAL   Final  . Transfusion Status 08/02/2020 OK TO TRANSFUSE   Final  . Crossmatch Result 08/02/2020    Final                   Value:Compatible Performed at [facility], [place] [address] [place], [place] [address]   . Unit Number 08/02/2020 [id number]   Final  . Blood Component Type 08/02/2020 RED CELLS,LR   Final  . Unit division 08/02/2020 00   Final  . Status of Unit 08/02/2020 ISSUED,FINAL   Final  . Transfusion Status 08/02/2020 OK TO TRANSFUSE   Final  . Crossmatch Result 08/02/2020 Compatible   Final  . WBC 08/03/2020 2.3* 4.0 - 10.5 K/uL Final  . RBC 08/03/2020 1.82* 4.22 - 5.81 MIL/uL Final  . Hemoglobin 08/03/2020 5.7* 13.0 - 17.0 g/dL Final   Comment: REPEATED TO VERIFY THIS CRITICAL RESULT HAS VERIFIED AND BEEN CALLED TO RN BOBBY BY ALEXIS Ingalls ON 04 08 2022 AT 0401, AND HAS BEEN READ BACK.    Marland Kitchen HCT 08/03/2020 17.6* 39.0 - 52.0 % Final  . MCV 08/03/2020 96.7  80.0 - 100.0 fL Final  . MCH 08/03/2020 31.3  26.0 - 34.0 pg Final  . MCHC 08/03/2020 32.4  30.0 -  36.0 g/dL Final  . RDW 08/03/2020 14.6  11.5 - 15.5 % Final  . Platelets 08/03/2020 82* 150 - 400 K/uL Final   Comment: SPECIMEN CHECKED FOR CLOTS Immature Platelet Fraction may be clinically indicated, consider ordering this additional test [id number] CONSISTENT WITH PREVIOUS RESULT REPEATED TO VERIFY   . nRBC 08/03/2020 0.0  0.0 - 0.2 % Final   Performed at [facility] [address] [place], [place] [address]  . Sodium 08/03/2020 141  135 - 145 mmol/L Final  . Potassium 08/03/2020 4.6  3.5 - 5.1 mmol/L Final  . Chloride 08/03/2020 116* 98 - 111 mmol/L Final  . CO2 08/03/2020 20* 22 - 32 mmol/L Final  . Glucose, Bld 08/03/2020 145* 70 - 99 mg/dL Final   Glucose reference range applies only to samples taken after fasting for at least 8 hours.  . BUN 08/03/2020 18  8 - 23 mg/dL Final  . Creatinine, Ser 08/03/2020 1.36* 0.61 - 1.24 mg/dL Final  . Calcium 08/03/2020 7.5* 8.9 - 10.3 mg/dL Final  . GFR, Estimated 08/03/2020 57* >60 mL/min Final   Comment: (NOTE) Calculated using the CKD-EPI Creatinine Equation (2021)   . Anion gap 08/03/2020 5  5 - 15 Final   Performed at [facility], [place] [address], [place], [place] [address]  . ISSUE DATE / TIME 08/02/2020 [id number]   Final  . Blood Product Unit Number 08/02/2020 [id number]   Final  . PRODUCT CODE 08/02/2020 [id number]   Final  . Unit Type and Rh 08/02/2020 1700   Final  . Blood Product Expiration Date 08/02/2020 [id number]   Final  . ISSUE DATE / TIME 08/02/2020 [id number]   Final  . Blood Product Unit Number 08/02/2020 [id number]   Final  . PRODUCT CODE 08/02/2020 [id number]   Final  . Unit Type and Rh 08/02/2020 1700   Final  . Blood Product Expiration Date 08/02/2020 [id number]   Final  . WBC 08/04/2020 5.6  4.0 - 10.5 K/uL Final  . RBC 08/04/2020 2.87* 4.22 - 5.81 MIL/uL Final  . Hemoglobin  08/04/2020 8.8* 13.0 - 17.0 g/dL Final   Comment: REPEATED TO VERIFY POST  TRANSFUSION SPECIMEN   . HCT 08/04/2020 25.4* 39.0 - 52.0 % Final  . MCV 08/04/2020 88.5  80.0 - 100.0 fL Final   Comment: POST TRANSFUSION SPECIMEN REPEATED TO VERIFY   . MCH 08/04/2020 30.7  26.0 - 34.0 pg Final  . MCHC 08/04/2020 34.6  30.0 - 36.0 g/dL Final  . RDW 08/04/2020 18.2* 11.5 - 15.5 % Final  . Platelets 08/04/2020 107* 150 - 400 K/uL Final   Comment: Immature Platelet Fraction may be clinically indicated, consider ordering this additional test [id number] CONSISTENT WITH PREVIOUS RESULT REPEATED TO VERIFY   . nRBC 08/04/2020 0.0  0.0 - 0.2 % Final   Performed at [place] [address], [place], [place] [address]  Office Visit on 06/12/2020  Component Date Value Ref Range Status  . CRP 06/12/2020 1.6  <8.0 mg/L Final  . Sed Rate 06/12/2020 43* 0 - 20 mm/h Final     X-Rays:No results found.  EKG: Orders placed or performed during the hospital encounter of 03/13/20  . EKG 12 lead per protocol  . EKG 12 lead per protocol  . EKG 12-Lead  . EKG 12-Lead     Hospital Course: Mark [name] is a 66 y.o. who was admitted to [facility]. They were brought to the operating room on 08/02/2020 and underwent Procedure(s): REIMPLANTATION/REVISION OF TOTAL KNEE WITH REMOVAL OF ANTIBIOTIC SPACER.  Patient tolerated the procedure well and was later transferred to the recovery room and then to the orthopaedic floor for postoperative care. They were given PO and IV analgesics for pain control following their surgery. They were given 24 hours of postoperative antibiotics of  Anti-infectives (From admission, onward)   Start     Dose/Rate Route Frequency Ordered Stop   08/03/20 1000  doxycycline (VIBRA-TABS) tablet 100 mg  Status:  Discontinued        100 mg Oral Every 12 hours 08/02/20 1518 08/04/20 1619   08/02/20 1700  ceFAZolin (ANCEF) IVPB 2g/100 mL premix        2 g 200 mL/hr over 30 Minutes Intravenous Every 6 hours 08/02/20 1518 08/03/20  0033   08/02/20 1227  vancomycin (VANCOCIN) powder  Status:  Discontinued          As needed 08/02/20 1229 08/02/20 1517   08/02/20 0745  ceFAZolin (ANCEF) IVPB 2g/100 mL premix        2 g 200 mL/hr over 30 Minutes Intravenous On call to O.R. 08/02/20 0730 08/02/20 1046     and started on DVT prophylaxis in the form of Aspirin.   PT and OT were ordered for total joint protocol. Discharge planning consulted to help with postop disposition and equipment needs. Patient had a fair night on the evening of surgery. Patient has chronic anemia at baseline, but was found to have a hemoglobin of 5.7%. He received 2 units PRBCs. They started to get up OOB with therapy on POD #1. Continued to work with therapy into POD #2.  Hemoglobin improved to 8.8%. Pt was seen during rounds on day two and was ready to go home pending progress with therapy. Pt declined additional physical therapy. He was discharged to home later that day in stable condition.  Diet: Regular diet Activity: PWB Follow-up: in 2 weeks Disposition: Home Discharged Condition: good   Discharge Instructions    Call MD / Call 911   Complete by: As directed  If you experience chest pain or shortness of breath, CALL 911 and be transported to the hospital emergency room.  If you develope a fever above 101 F, pus (white drainage) or increased drainage or redness at the wound, or calf pain, call your surgeon's office.   Change dressing   Complete by: As directed    Maintain surgical dressing until follow up in the clinic. If the edges start to pull up, may reinforce with tape. If the dressing is no longer working, may remove and cover with gauze and tape, but must keep the area dry and clean.  Call with any questions or concerns.   Constipation Prevention   Complete by: As directed    Drink plenty of fluids.  Prune juice may be helpful.  You may use a stool softener, such as Colace (over the counter) 100 mg twice a day.  Use MiraLax (over the  counter) for constipation as needed.   Diet - low sodium heart healthy   Complete by: As directed    Discharge instructions   Complete by: As directed    Maintain surgical dressing until follow up in the clinic. If the edges start to pull up, may reinforce with tape. If the dressing is no longer working, may remove and cover with gauze and tape, but must keep the area dry and clean.  Follow up in 2 weeks at [facility]. Call with any questions or concerns.   Increase activity slowly as tolerated   Complete by: As directed    Partial weight bearing with assist device as directed.   TED hose   Complete by: As directed    Use stockings (TED hose) for 2 weeks on both leg(s).  You may remove them at night for sleeping.     Allergies as of 08/04/2020   No Known Allergies     Medication List    STOP taking these medications   naproxen 500 MG tablet Commonly known as: NAPROSYN     TAKE these medications   aspirin 81 MG chewable tablet Chew 1 tablet (81 mg total) by mouth 2 (two) times daily for 28 days.   docusate sodium 100 MG capsule Commonly known as: Colace Take 1 capsule (100 mg total) by mouth 2 (two) times daily.   doxycycline 100 MG tablet Commonly known as: VIBRA-TABS Take 1 tablet (100 mg total) by mouth 2 (two) times daily. What changed: when to take this   ferrous sulfate 325 (65 FE) MG tablet Take 1 tablet (325 mg total) by mouth 3 (three) times daily with meals for 14 days. Take for two weeks as tolerated. What changed: additional instructions   folic acid 1 MG tablet Commonly known as: FOLVITE TAKE 1 TABLET(1 MG) BY MOUTH DAILY What changed: See the new instructions.   furosemide 20 MG tablet Commonly known as: LASIX TAKE 1 TABLET(20 MG) BY MOUTH TWICE DAILY What changed: See the new instructions.   lactulose 10 GM/15ML solution Commonly known as: CHRONULAC TAKE 45 MILLILITERS BY MOUTH TWICE DAILY   methocarbamol 500 MG tablet Commonly known as:  ROBAXIN Take 1 tablet (500 mg total) by mouth every 6 (six) hours as needed for muscle spasms.   nadolol 20 MG tablet Commonly known as: CORGARD TAKE 1 TABLET BY MOUTH EVERY DAY   oxyCODONE 5 MG immediate release tablet Commonly known as: Oxy IR/ROXICODONE Take 1-2 tablets (5-10 mg total) by mouth every 4 (four) hours as needed for moderate pain (pain score 4-6).   pantoprazole  40 MG tablet Commonly known as: PROTONIX TAKE 1 TABLET BY MOUTH EVERY DAY   polyethylene glycol 17 g packet Commonly known as: MIRALAX / GLYCOLAX Take 17 g by mouth daily as needed for mild constipation. What changed:   when to take this  reasons to take this            Discharge Care Instructions  (From admission, onward)         Start     Ordered   08/04/20 0000  Change dressing       Comments: Maintain surgical dressing until follow up in the clinic. If the edges start to pull up, may reinforce with tape. If the dressing is no longer working, may remove and cover with gauze and tape, but must keep the area dry and clean.  Call with any questions or concerns.   08/04/20 0831          Follow-up Information    Paralee Cancel, MD. Schedule an appointment as soon as possible for a visit in 2 weeks.   Specialty: Orthopedic Surgery Contact information: [address] [place] [address] [phone]               Signed: Griffith Citron, PA-C Orthopedic Surgery 08/13/2020, 2:56 PM

## 2020-08-14 ENCOUNTER — Other Ambulatory Visit: Payer: Self-pay

## 2020-08-15 ENCOUNTER — Encounter: Payer: Self-pay | Admitting: Family Medicine

## 2020-08-15 ENCOUNTER — Ambulatory Visit (INDEPENDENT_AMBULATORY_CARE_PROVIDER_SITE_OTHER): Payer: Medicare Other | Admitting: Family Medicine

## 2020-08-15 VITALS — BP 106/58 | HR 100 | Temp 98.0°F | Wt 123.0 lb

## 2020-08-15 DIAGNOSIS — Z8673 Personal history of transient ischemic attack (TIA), and cerebral infarction without residual deficits: Secondary | ICD-10-CM

## 2020-08-15 DIAGNOSIS — K4090 Unilateral inguinal hernia, without obstruction or gangrene, not specified as recurrent: Secondary | ICD-10-CM

## 2020-08-15 DIAGNOSIS — Z96651 Presence of right artificial knee joint: Secondary | ICD-10-CM | POA: Diagnosis not present

## 2020-08-15 DIAGNOSIS — D508 Other iron deficiency anemias: Secondary | ICD-10-CM

## 2020-08-15 DIAGNOSIS — L819 Disorder of pigmentation, unspecified: Secondary | ICD-10-CM

## 2020-08-15 MED ORDER — FERROUS SULFATE 325 (65 FE) MG PO TABS: 325.0000 mg | ORAL_TABLET | Freq: Two times a day (BID) | ORAL | 2 refills | Status: AC

## 2020-08-15 MED ORDER — DOCUSATE SODIUM 100 MG PO CAPS: 100.0000 mg | ORAL_CAPSULE | Freq: Two times a day (BID) | ORAL | 1 refills | Status: DC

## 2020-08-15 MED ORDER — ASPIRIN 81 MG PO CHEW: 81.0000 mg | CHEWABLE_TABLET | Freq: Every day | ORAL | 1 refills | Status: DC

## 2020-08-15 NOTE — Patient Instructions (Signed)
Iron Deficiency Anemia, Adult Iron deficiency anemia is when you do not have enough red blood cells or hemoglobin in your blood. This happens because you have too little iron in your body. Hemoglobin carries oxygen to parts of the body. Anemia can cause your body to not get enough oxygen. What are the causes?  Not eating enough foods that have iron in them.  The body not being able to take in iron well.  Needing more iron due to pregnancy or heavy menstrual periods, for females.  Cancer.  Bleeding in the bowels.  Many blood draws. What increases the risk?  Being pregnant.  Being a teenage girl going through a growth spurt. What are the signs or symptoms?  Pale skin, lips, and nails.  Weakness, dizziness, and getting tired easily.  Headache.  Feeling like you cannot breathe well when moving (shortness of breath).  Cold hands and feet.  Fast heartbeat or a heartbeat that is not regular.  Feeling grouchy (irritable) or breathing fast. These are more common in very bad anemia. Mild anemia may not cause any symptoms. How is this treated? This condition is treated by finding out why you do not have enough iron and then getting more iron. It may include:  Adding foods to your diet that have a lot of iron.  Taking iron pills (supplements). If you are pregnant or breastfeeding, you may need to take extra iron. Your diet often does not provide the amount of iron that you need.  Getting more vitamin C in your diet. Vitamin C helps your body take in iron. You may need to take iron pills with a glass of orange juice or vitamin C pills.  Medicines to make heavy menstrual periods lighter.  Surgery. You may need blood tests to see if treatment is working. If the treatment does not seem to be working, you may need more tests. Follow these instructions at home: Medicines  Take over-the-counter and prescription medicines only as told by your doctor. This includes iron pills and  vitamins. ? Take iron pills when your stomach is empty. If you cannot handle this, take them with food. ? Do not drink milk or take antacids at the same time as your iron pills. ? Iron pills may turn your poop (stool)black.  If you cannot handle taking iron pills by mouth, ask your doctor about getting iron through: ? An IV tube. ? A shot (injection) into a muscle. Eating and drinking  Talk with your doctor before changing the foods you eat. He or she may tell you to eat foods that have a lot of iron, such as: ? Liver. ? Low-fat (lean) beef. ? Breads and cereals that have iron added to them. ? Eggs. ? Dried fruit. ? Dark green, leafy vegetables.  Eat fresh fruits and vegetables that are high in vitamin C. They help your body use iron. Foods with a lot of vitamin C include: ? Oranges. ? Peppers. ? Tomatoes. ? Mangoes.  Drink enough fluid to keep your pee (urine) pale yellow.   Managing constipation If you are taking iron pills, they may cause trouble pooping (constipation). To prevent or treat trouble pooping, you may need to:  Take over-the-counter or prescription medicines.  Eat foods that are high in fiber. These include beans, whole grains, and fresh fruits and vegetables.  Limit foods that are high in fat and sugar. These include fried or sweet foods. General instructions  Return to your normal activities as told by your doctor.   Ask your doctor what activities are safe for you.  Keep yourself clean, and keep things clean around you.  Keep all follow-up visits as told by your doctor. This is important. Contact a doctor if:  You feel like you may vomit (nauseous), or you vomit.  You feel weak.  You are sweating for no reason.  You have trouble pooping, such as: ? Pooping less than 3 times a week. ? Straining to poop. ? Having poop that is hard, dry, or larger than normal. ? Feeling full or bloated. ? Pain in the lower belly. ? Not feeling better after  pooping. Get help right away if:  You pass out (faint).  You have chest pain.  You have trouble breathing that: ? Is very bad. ? Gets worse with physical activity.  You have a fast heartbeat, or a heartbeat that does not feel regular.  You get light-headed when getting up from sitting or lying down. These symptoms may be an emergency. Do not wait to see if the symptoms will go away. Get medical help right away. Call your local emergency services (911 in the [place] Do not drive yourself to the hospital. Summary  Iron deficiency anemia is when you have too little iron in your body.  This condition is treated by finding out why you do not have enough iron in your body and then getting more iron.  Take over-the-counter and prescription medicines only as told by your doctor.  Eat fresh fruits and vegetables that are high in vitamin C.  Get help right away if you cannot breathe well. This information is not intended to replace advice given to you by your health care provider. Make sure you discuss any questions you have with your health care provider. Document Revised: 12/21/2018 Document Reviewed: 12/21/2018 Elsevier Patient Education  2021 [place].  Inguinal Hernia, Adult An inguinal hernia is when fat or your intestines push through a weak spot in a muscle where your leg meets your lower belly (groin). This causes a bulge. This kind of hernia could also be:  In your scrotum, if you are male.  In folds of skin around your vagina, if you are male. There are three types of inguinal hernias:  Hernias that can be pushed back into the belly (are reducible). This type rarely causes pain.  Hernias that cannot be pushed back into the belly (are incarcerated).  Hernias that cannot be pushed back into the belly and lose their blood supply (are strangulated). This type needs emergency surgery. What are the causes? This condition is caused by having a weak spot in the muscles or  tissues in your groin. This develops over time. The hernia may poke through the weak spot when you strain your lower belly muscles all of a sudden, such as when you:  Lift a heavy object.  Strain to poop (have a bowel movement). Trouble pooping (constipation) can lead to straining.  Cough. What increases the risk? This condition is more likely to develop in:  Males.  Pregnant females.  People who: ? Are overweight. ? Work in jobs that require long periods of standing or heavy lifting. ? Have had an inguinal hernia before. ? Smoke or have lung disease. These factors can lead to long-term (chronic) coughing. What are the signs or symptoms? Symptoms may depend on the size of the hernia. Often, a small hernia has no symptoms. Symptoms of a larger hernia may include:  A bulge in the groin area. This is easier  to see when standing. You might not be able to see it when you are lying down.  Pain or burning in the groin. This may get worse when you lift, strain, or cough.  A dull ache or a feeling of pressure in the groin.  An abnormal bulge in the scrotum, in males. Symptoms of a strangulated inguinal hernia may include:  A bulge in your groin that is very painful and tender to the touch.  A bulge that turns red or purple.  Fever, feeling like you may vomit (nausea), and vomiting.  Not being able to poop or to pass gas. How is this treated? Treatment depends on the size of your hernia and whether you have symptoms. If you do not have symptoms, your doctor may have you watch your hernia carefully and have you come in for follow-up visits. If your hernia is large or if you have symptoms, you may need surgery to repair the hernia. Follow these instructions at home: Lifestyle  Avoid lifting heavy objects.  Avoid standing for long amounts of time.  Do not smoke or use any products that contain nicotine or tobacco. If you need help quitting, ask your doctor.  Stay at a healthy  weight. Prevent trouble pooping You may need to take these actions to prevent or treat trouble pooping:  Drink enough fluid to keep your pee (urine) pale yellow.  Take over-the-counter or prescription medicines.  Eat foods that are high in fiber. These include beans, whole grains, and fresh fruits and vegetables.  Limit foods that are high in fat and sugar. These include fried or sweet foods. General instructions  You may try to push your hernia back in place by very gently pressing on it when you are lying down. Do not try to push the bulge back in if it will not go in easily.  Watch your hernia for any changes in shape, size, or color. Tell your doctor if you see any changes.  Take over-the-counter and prescription medicines only as told by your doctor.  Keep all follow-up visits. Contact a doctor if:  You have a fever or chills.  You have new symptoms.  Your symptoms get worse. Get help right away if:  You have pain in your groin that gets worse all of a sudden.  You have a bulge in your groin that: ? Gets bigger all of a sudden, and it does not get smaller after that. ? Turns red or purple. ? Is painful when you touch it.  You are a male, and you have: ? Sudden pain in your scrotum. ? A sudden change in the size of your scrotum.  You cannot push the hernia back in place by very gently pressing on it when you are lying down.  You feel like you may vomit, and that feeling does not go away.  You keep vomiting.  You have a fast heartbeat.  You cannot poop or pass gas. These symptoms may be an emergency. Get help right away. Call your local emergency services (911 in the [place]  Do not wait to see if the symptoms will go away.  Do not drive yourself to the hospital. Summary  An inguinal hernia is when fat or your intestines push through a weak spot in a muscle where your leg meets your lower belly (groin). This causes a bulge.  If you do not have symptoms, you  may not need treatment. If you have symptoms or a large hernia, you may need  surgery.  Avoid lifting heavy objects. Also, avoid standing for long amounts of time.  Do not try to push the bulge back in if it will not go in easily. This information is not intended to replace advice given to you by your health care provider. Make sure you discuss any questions you have with your health care provider. Document Revised: 12/13/2019 Document Reviewed: 12/13/2019 Elsevier Patient Education  2021 [employer].

## 2020-08-15 NOTE — Progress Notes (Signed)
Subjective:    Patient ID: Mark [name], male    DOB: 1955/10/18, 65 y.o.   MRN: [id number]  No chief complaint on file. Patient accompanied by his wife.  HPI Patient is a 65 year old male with past medical history significant for EtOH abuse, anemia, cirrhosis, ascites, chronic pain, HLD, HTN, h/o R TKR who was seen today for follow-up.  Patient endorses continued pain/discomfort from left inguinal hernia.  Per chart review previously referred to gen surg 06/09/2019.  Pt h/o R TKR complicated by infection requiring resection and placement of antibiotic spacer.  States has been doing well endorses increased pain on Sunday.  Has appointment with Ortho tomorrow.  Notes no longer having edema in right LE, but has discoloration of skin on feet.  Patient states he wants to know all the medications he is on because if he does not need them he does not want to take them.  Pt started to apologize to this provider for past behavior then goes on to say he was upset he could not get pain medication.  Per chart review s/p R TKR complicated by infection requiring resection and placement of antibiotic spacer with reimplantation/revision of total knee with removal of antibiotic spacer on 08/02/2020.  Per discharge summary patient declined [facility] PT/OT.  Pt s/p 2 units PRBCs 2/2 hemoglobin 5.7%.  Hemoglobin improved to 8.8%  Past Medical History:  Diagnosis Date  . Alcohol abuse   . Anemia   . Arthritis   . Ascites   . Cirrhosis ([place])   . Coffee ground emesis   . Dehydration 06/17/2017  . Febrile illness   . Heart murmur   . History of blood transfusion   . Hyperlipidemia   . Hypertension   . Leg swelling   . Myocardial infarction ([place]) 2012  . Preop cardiovascular exam 04/14/2013  . Sepsis ([place]) 06/17/2017  . Septic shock ([place]) 06/18/2017  . SIRS (systemic inflammatory response syndrome) ([place]) 07/11/2017  . Stroke [facility]) 2012   no deficits  . Thrombocytopenia ([name])     No Known  Allergies  ROS General: Denies fever, chills, night sweats, changes in weight, changes in appetite HEENT: Denies headaches, ear pain, changes in vision, rhinorrhea, sore throat CV: Denies CP, palpitations, SOB, orthopnea Pulm: Denies SOB, cough, wheezing GI: Denies abdominal pain, nausea, vomiting, diarrhea, constipation GU: Denies dysuria, hematuria, frequency Msk: Denies muscle cramps, joint pains  + right knee pain status post right TKR Neuro: Denies weakness, numbness, tingling Skin: Denies rashes, bruising  + skin changes of bilateral feet Psych: Denies depression, anxiety, hallucinations     Objective:    Blood pressure (!) 106/58, pulse 100, temperature 98 F (36.7 C), temperature source Oral, weight 123 lb (55.8 kg), SpO2 98 %.  Gen. Pleasant, well-nourished, in no distress, poor historian, normal affect   HEENT: [place]/AT, face symmetric, conjunctiva clear, no scleral icterus, PERRLA, EOMI, nares patent without drainage Lungs: no accessory muscle use, CTAB, no wheezes or rales Cardiovascular:Tachycardic, no m/r/g, no peripheral edema Musculoskeletal: No deformities, no cyanosis or clubbing, normal tone Neuro:  A&Ox3, CN II-XII intact, sitting in transport wheelchair.  Has a walking stick with him. Skin:  Warm,dry, intact.  R foot with hypertrophic hyperpigmented plaques and a healing ulcerated area.  No open wounds noted.   Wt Readings from Last 3 Encounters:  08/15/20 123 lb (55.8 kg)  08/02/20 130 lb (59 kg)  07/27/20 130 lb (59 kg)    Lab Results  Component Value Date   WBC 5.6  08/04/2020   HGB 8.8 (L) 08/04/2020   HCT 25.4 (L) 08/04/2020   PLT 107 (L) 08/04/2020   GLUCOSE 145 (H) 08/03/2020   CHOL 84 12/01/2013   TRIG 58 12/01/2013   HDL 26 (L) 12/01/2013   LDLCALC 46 12/01/2013   ALT 11 08/02/2020   AST 25 08/02/2020   NA 141 08/03/2020   K 4.6 08/03/2020   CL 116 (H) 08/03/2020   CREATININE 1.36 (H) 08/03/2020   BUN 18 08/03/2020   CO2 20 (L) 08/03/2020    TSH 0.806 12/23/2018   INR 1.3 (H) 08/02/2020   HGBA1C  11/01/2009    5.1 (NOTE)                                                                       According to the ADA Clinical Practice Recommendations for 2011, when HbA1c is used as a screening test:   >=6.5%   Diagnostic of Diabetes Mellitus           (if abnormal result  is confirmed)  5.7-6.4%   Increased risk of developing Diabetes Mellitus  References:Diagnosis and Classification of Diabetes Mellitus,Diabetes IONG,2952,84(XLKGM 1):S62-S69 and Standards of Medical Care in         Diabetes - 2011,Diabetes Care,2011,34  (Suppl 1):S11-S61.    Assessment/Plan:  Left inguinal hernia  - Plan: Ambulatory referral to General Surgery  Status post right knee replacement -Per chart review s/p R TKR complicated by infection requiring resection and placement of antibiotic spacer with reimplantation/revision of total knee with removal of antibiotic spacer on 08/02/2020. -Continue DVT prophylaxis with ASA -PT/OT advised. -Discussed supportive care -Encouraged to keep follow-up with Ortho tomorrow  Iron deficiency anemia secondary to inadequate dietary iron intake -Per chart review pt s/p 2 units PRBCs 2/2 hemoglobin 5.7% on 08/03/20.  Hemoglobin improved to 8.8% on 08/04/20 - Plan: ferrous sulfate 325 (65 FE) MG tablet, docusate sodium (COLACE) 100 MG capsule  History of CVA (cerebrovascular accident) - Plan: aspirin 81 MG chewable tablet  Hyperpigmentation of skin -Discussed likely 2/2 chronic changes from edema -Patient advised to keep feet clean, dry, and avoid picking at hypertrophic areas. -Discussed using a good moisturizer and elevating LEs when able -Other lifestyle modifications encouraged.  Patient advised to bring medications to next [facility] so they can be reconciled.   F/u as needed  More than 50% of over 45 minutes spent in total spent face-to-face with the patient, caring for/counseling and/or coordinating care in addition to  time spent on chart review.   Grier Mitts, MD

## 2020-08-23 ENCOUNTER — Other Ambulatory Visit: Payer: Self-pay | Admitting: Family Medicine

## 2020-08-28 DIAGNOSIS — Z471 Aftercare following joint replacement surgery: Secondary | ICD-10-CM | POA: Diagnosis not present

## 2020-08-28 DIAGNOSIS — Z96651 Presence of right artificial knee joint: Secondary | ICD-10-CM | POA: Diagnosis not present

## 2020-08-31 ENCOUNTER — Telehealth: Payer: Self-pay | Admitting: Family Medicine

## 2020-08-31 NOTE — Telephone Encounter (Signed)
Spoke with patient and advised they had left him a vm to call them and schedule the consult appt. Gave patient number to call.

## 2020-08-31 NOTE — Telephone Encounter (Signed)
Patient states Dr. Volanda Napoleon told him someone would call about his hernia within 2 days and that it has been a week and no one has called him.

## 2020-08-31 NOTE — Telephone Encounter (Signed)
ATC patient, no answer, left vm to all office

## 2020-09-14 DIAGNOSIS — M25561 Pain in right knee: Secondary | ICD-10-CM | POA: Diagnosis not present

## 2020-10-03 ENCOUNTER — Other Ambulatory Visit: Payer: Self-pay | Admitting: Surgery

## 2020-10-03 DIAGNOSIS — K4090 Unilateral inguinal hernia, without obstruction or gangrene, not specified as recurrent: Secondary | ICD-10-CM | POA: Diagnosis not present

## 2020-10-11 DIAGNOSIS — H2513 Age-related nuclear cataract, bilateral: Secondary | ICD-10-CM | POA: Diagnosis not present

## 2020-10-23 ENCOUNTER — Other Ambulatory Visit: Payer: Self-pay

## 2020-10-23 ENCOUNTER — Encounter (HOSPITAL_BASED_OUTPATIENT_CLINIC_OR_DEPARTMENT_OTHER): Payer: Self-pay | Admitting: Surgery

## 2020-10-31 ENCOUNTER — Ambulatory Visit (HOSPITAL_COMMUNITY): Payer: Medicare (Managed Care) | Admitting: Anesthesiology

## 2020-10-31 NOTE — Progress Notes (Signed)
Reminded pt to come in for lab work, pt states, "will try my best to come in but I might not have a ride".

## 2020-10-31 NOTE — H&P (Signed)
Mark [name] Appointment: 10/03/2020 4:20 PM Location: [facility] Patient #: [id number] DOB: 26-Oct-1955 Married / Language: English / Race: Black or African American Male   History of Present Illness (Dareion Kneece A. Ninfa Linden MD; 10/03/2020 4:52 PM) The patient is a 65 year old male who presents with an inguinal hernia.  Chief complaint: Left inguinal hernia  This gentleman is referred here for evaluation of left inguinal hernia.  He has multiple medical issues including history of alcohol abuse.  He recently required replacement of infected knee prosthetic.  He has been having increasing discomfort from a left inguinal hernia with pain but no obstructive symptoms.  He reports that it does easily reduce.  His most recent liver fossa just for normal.  He did require transfusion after his most recent surgery.  He has no nausea or vomiting.   Past Surgical History Altamese Cabal, [place]; 10/03/2020 4:49 PM) Knee Surgery   Right. Oral Surgery    Diagnostic Studies History Altamese Cabal, [place]; 10/03/2020 4:49 PM) Colonoscopy   1-5 years ago  Allergies (Jocelyn Bond, CMA; 10/03/2020 4:30 PM) No Known Drug Allergies   [10/03/2020]: Allergies Reconciled    Medication History (Jocelyn Bond, CMA; 10/03/2020 4:34 PM) Pantoprazole Sodium  (40MG  For Solution, Intravenous) Active. Doxycycline  (40MG  Capsule DR, Oral) Active. Furosemide  (20MG  Tablet, Oral) Active. Iron  (325 (65 Fe)MG Tablet, Oral) Active. Folic Acid  (1MG  Tablet, Oral) Active. Docusate Calcium  (240MG  Capsule, Oral) Active. Medications Reconciled   Social History Altamese Cabal, CMA; 10/03/2020 4:49 PM) Alcohol use   Occasional alcohol use. No drug use   Tobacco use   Current some day smoker.  Family History Altamese Cabal, [place]; 10/03/2020 4:49 PM) Arthritis   Father. Hypertension   Brother.  Other Problems Altamese Cabal, [place]; 10/03/2020 4:49 PM) Back Pain   Heart murmur   Inguinal Hernia   Transfusion history       Review of Systems Altamese Cabal CMA; 10/03/2020 4:49 PM) General Not Present- Appetite Loss, Chills, Fatigue, Fever, Night Sweats, Weight Gain and Weight Loss. Skin Not Present- Change in Wart/Mole, Dryness, Hives, Jaundice, New Lesions, Non-Healing Wounds, Rash and Ulcer. HEENT Not Present- Earache, Hearing Loss, Hoarseness, Nose Bleed, Oral Ulcers, Ringing in the Ears, Seasonal Allergies, Sinus Pain, Sore Throat, Visual Disturbances, Wears glasses/contact lenses and Yellow Eyes. Respiratory Not Present- Bloody sputum, Chronic Cough, Difficulty Breathing, Snoring and Wheezing. Breast Not Present- Breast Mass, Breast Pain, Nipple Discharge and Skin Changes. Cardiovascular Not Present- Chest Pain, Difficulty Breathing Lying Down, Leg Cramps, Palpitations, Rapid Heart Rate, Shortness of Breath and Swelling of Extremities. Gastrointestinal Not Present- Abdominal Pain, Bloating, Bloody Stool, Change in Bowel Habits, Chronic diarrhea, Constipation, Difficulty Swallowing, Excessive gas, Gets full quickly at meals, Hemorrhoids, Indigestion, Nausea, Rectal Pain and Vomiting. Male Genitourinary Present- Painful Urination. Not Present- Blood in Urine, Change in Urinary Stream, Frequency, Impotence, Nocturia, Urgency and Urine Leakage. Musculoskeletal Not Present- Back Pain, Joint Pain, Joint Stiffness, Muscle Pain, Muscle Weakness and Swelling of Extremities. Neurological Present- Trouble walking and Weakness. Not Present- Decreased Memory, Fainting, Headaches, Numbness, Seizures, Tingling and Tremor. Psychiatric Not Present- Anxiety, Bipolar, Change in Sleep Pattern, Depression, Fearful and Frequent crying. Endocrine Not Present- Cold Intolerance, Excessive Hunger, Hair Changes, Heat Intolerance, Hot flashes and New Diabetes. Hematology Not Present- Blood Thinners, Easy Bruising, Excessive bleeding, Gland problems, HIV and Persistent Infections.  Vitals (Jocelyn Bond CMA; 10/03/2020 4:29 PM) 10/03/2020  4:29 PM Weight: 124 lb   Height: 68 in  Body Surface Area:  1.67 m   Body Mass Index: 18.85 kg/m   Temp.: 98.2 F    Pulse: 97 (Regular)    P.OX: 99% (Room air) BP: 120/50(Sitting, Left Arm, Standard)       Physical Exam (Jerek Meulemans A. Ninfa Linden MD; 10/03/2020 4:53 PM) The physical exam findings are as follows: Note:  He appears well exam today.  He has a small, easily reducible left inguinal hernia without evidence of right inguinal hernia. His abdomen is soft. There is no evidence of ascites  Lungs clear  Cv RRR  Abdomen otherwise NT  Skin without rash   Assessment & Plan (Della Homan A. Ninfa Linden MD; 10/03/2020 4:54 PM) LEFT INGUINAL HERNIA (K40.90) Impression: I have reviewed his notes in the electronic medical records.  He does have a symptomatic left inguinal hernia. I discussed the diagnosis with him. He is eager proceed with repair. I would plan an open repair with mesh given his previous history of alcohol abuse and cirrhosis. I explained the risks which includes but is not limited to bleeding, infection, injury to surrounding structures, these of mesh, nerve entrapment, chronic pain, etc. He understands and wishes to proceed with surgery soon as possible.

## 2020-10-31 NOTE — Anesthesia Preprocedure Evaluation (Addendum)
Anesthesia Evaluation    Airway        Dental   Pulmonary           Cardiovascular   09/22/18 Echo 1. The left ventricle has normal systolic function with an ejection  fraction of 60-65%. The cavity size was normal. There is mildly increased  left ventricular wall thickness. Left ventricular diastolic parameters  were normal.  2. The right ventricle has normal systolic function. The cavity was  normal. There is no increase in right ventricular wall thickness.  3. Left atrial size was severely dilated.  4. No evidence of mitral valve stenosis.  5. Mild thickening of the aortic valve. Mild calcification of the aortic  valve. No stenosis of the aortic valve.    Neuro/Psych    GI/Hepatic Medicated and Controlled,  Endo/Other    Renal/GU      Musculoskeletal   Abdominal   Peds  Hematology   Anesthesia Other Findings   Reproductive/Obstetrics                            Anesthesia Physical Anesthesia Plan  ASA:   Anesthesia Plan:    Post-op Pain Management:    Induction:   PONV Risk Score and Plan:   Airway Management Planned:   Additional Equipment:   Intra-op Plan:   Post-operative Plan:   Informed Consent:     Dental advisory given  Plan Discussed with:   Anesthesia Plan Comments: (   Case CX By Surgeon due to Labs: ANemia)       Anesthesia Quick Evaluation

## 2020-11-01 ENCOUNTER — Encounter (HOSPITAL_BASED_OUTPATIENT_CLINIC_OR_DEPARTMENT_OTHER)
Admission: RE | Disposition: A | Discharge status: Home / Self Care | Payer: Self-pay | Source: Ambulatory Visit | Attending: Surgery

## 2020-11-01 ENCOUNTER — Telehealth: Payer: Self-pay

## 2020-11-01 ENCOUNTER — Ambulatory Visit (HOSPITAL_BASED_OUTPATIENT_CLINIC_OR_DEPARTMENT_OTHER)
Admission: RE | Admit: 2020-11-01 | Discharge: 2020-11-01 | Disposition: A | Discharge status: Home / Self Care | Payer: Medicare (Managed Care) | Source: Ambulatory Visit | Attending: Surgery | Admitting: Surgery

## 2020-11-01 ENCOUNTER — Other Ambulatory Visit: Payer: Self-pay

## 2020-11-01 ENCOUNTER — Encounter (HOSPITAL_BASED_OUTPATIENT_CLINIC_OR_DEPARTMENT_OTHER): Payer: Self-pay | Admitting: Surgery

## 2020-11-01 DIAGNOSIS — K7460 Unspecified cirrhosis of liver: Secondary | ICD-10-CM | POA: Diagnosis not present

## 2020-11-01 DIAGNOSIS — K4090 Unilateral inguinal hernia, without obstruction or gangrene, not specified as recurrent: Secondary | ICD-10-CM | POA: Insufficient documentation

## 2020-11-01 DIAGNOSIS — F17200 Nicotine dependence, unspecified, uncomplicated: Secondary | ICD-10-CM | POA: Insufficient documentation

## 2020-11-01 DIAGNOSIS — Z5309 Procedure and treatment not carried out because of other contraindication: Secondary | ICD-10-CM | POA: Diagnosis not present

## 2020-11-01 HISTORY — DX: Gastro-esophageal reflux disease without esophagitis: K21.9

## 2020-11-01 LAB — CBC WITH DIFFERENTIAL/PLATELET
Abs Immature Granulocytes: 0.01 10*3/uL (ref 0.00–0.07)
Basophils Absolute: 0 10*3/uL (ref 0.0–0.1)
Basophils Relative: 1 %
Eosinophils Absolute: 0.3 10*3/uL (ref 0.0–0.5)
Eosinophils Relative: 8 %
HCT: 18.7 % — ABNORMAL LOW (ref 39.0–52.0)
Hemoglobin: 6.2 g/dL — CL (ref 13.0–17.0)
Immature Granulocytes: 0 %
Lymphocytes Relative: 13 %
Lymphs Abs: 0.6 10*3/uL — ABNORMAL LOW (ref 0.7–4.0)
MCH: 31.2 pg (ref 26.0–34.0)
MCHC: 33.2 g/dL (ref 30.0–36.0)
MCV: 94 fL (ref 80.0–100.0)
Monocytes Absolute: 0.6 10*3/uL (ref 0.1–1.0)
Monocytes Relative: 14 %
Neutro Abs: 2.8 10*3/uL (ref 1.7–7.7)
Neutrophils Relative %: 64 %
Platelets: 176 10*3/uL (ref 150–400)
RBC: 1.99 MIL/uL — ABNORMAL LOW (ref 4.22–5.81)
RDW: 15.9 % — ABNORMAL HIGH (ref 11.5–15.5)
WBC: 4.3 10*3/uL (ref 4.0–10.5)
nRBC: 0 % (ref 0.0–0.2)

## 2020-11-01 LAB — COMPREHENSIVE METABOLIC PANEL
ALT: 10 U/L (ref 0–44)
AST: 29 U/L (ref 15–41)
Albumin: 2.2 g/dL — ABNORMAL LOW (ref 3.5–5.0)
Alkaline Phosphatase: 91 U/L (ref 38–126)
Anion gap: 10 (ref 5–15)
BUN: 34 mg/dL — ABNORMAL HIGH (ref 8–23)
CO2: 18 mmol/L — ABNORMAL LOW (ref 22–32)
Calcium: 8 mg/dL — ABNORMAL LOW (ref 8.9–10.3)
Chloride: 99 mmol/L (ref 98–111)
Creatinine, Ser: 2.17 mg/dL — ABNORMAL HIGH (ref 0.61–1.24)
GFR, Estimated: 33 mL/min — ABNORMAL LOW (ref >60)
Glucose, Bld: 81 mg/dL (ref 70–99)
Potassium: 4.8 mmol/L (ref 3.5–5.1)
Sodium: 127 mmol/L — ABNORMAL LOW (ref 135–145)
Total Bilirubin: 1.2 mg/dL (ref 0.3–1.2)
Total Protein: 7.8 g/dL (ref 6.5–8.1)

## 2020-11-01 LAB — PROTIME-INR
INR: 1.3 — ABNORMAL HIGH (ref 0.8–1.2)
Prothrombin Time: 16.1 seconds — ABNORMAL HIGH (ref 11.4–15.2)

## 2020-11-01 LAB — APTT: aPTT: 45 seconds — ABNORMAL HIGH (ref 24–36)

## 2020-11-01 SURGERY — REPAIR, HERNIA, INGUINAL, ADULT
Anesthesia: General | Laterality: Left

## 2020-11-01 MED ORDER — CEFAZOLIN SODIUM-DEXTROSE 2-4 GM/100ML-% IV SOLN: 2.0000 g | INTRAVENOUS | Status: DC

## 2020-11-01 MED ORDER — LIDOCAINE HCL (PF) 2 % IJ SOLN
INTRAMUSCULAR | Status: AC
  Filled 2020-11-01: qty 5

## 2020-11-01 MED ORDER — ACETAMINOPHEN 500 MG PO TABS
ORAL_TABLET | ORAL | Status: AC
  Filled 2020-11-01: qty 2

## 2020-11-01 MED ORDER — PROPOFOL 10 MG/ML IV BOLUS
INTRAVENOUS | Status: AC
  Filled 2020-11-01: qty 20

## 2020-11-01 MED ORDER — CHLORHEXIDINE GLUCONATE CLOTH 2 % EX PADS: 6.0000 | MEDICATED_PAD | Freq: Once | CUTANEOUS | Status: DC

## 2020-11-01 MED ORDER — FENTANYL CITRATE (PF) 100 MCG/2ML IJ SOLN
INTRAMUSCULAR | Status: AC
  Filled 2020-11-01: qty 2

## 2020-11-01 MED ORDER — ACETAMINOPHEN 500 MG PO TABS: 1000.0000 mg | ORAL_TABLET | ORAL | Status: DC

## 2020-11-01 MED ORDER — LACTATED RINGERS IV SOLN: INTRAVENOUS | Status: DC

## 2020-11-01 MED ORDER — ONDANSETRON HCL 4 MG/2ML IJ SOLN
INTRAMUSCULAR | Status: AC
  Filled 2020-11-01: qty 2

## 2020-11-01 MED ORDER — CEFAZOLIN SODIUM-DEXTROSE 2-4 GM/100ML-% IV SOLN
INTRAVENOUS | Status: AC
  Filled 2020-11-01: qty 100

## 2020-11-01 MED ORDER — DEXAMETHASONE SODIUM PHOSPHATE 10 MG/ML IJ SOLN
INTRAMUSCULAR | Status: AC
  Filled 2020-11-01: qty 1

## 2020-11-01 MED ORDER — MIDAZOLAM HCL 2 MG/2ML IJ SOLN
INTRAMUSCULAR | Status: AC
  Filled 2020-11-01: qty 2

## 2020-11-01 NOTE — Telephone Encounter (Signed)
CRITICAL VALUE STICKER  CRITICAL VALUE: hemoglobin 6.2  RECEIVER (on-site recipient of call): Mark [name]  DATE & TIME NOTIFIED: 11:00 am   MESSENGER (representative from lab): Christina from [facility]  MD NOTIFIED:  [place]: 11:02 am  RESPONSE:

## 2020-11-01 NOTE — Discharge Summary (Signed)
  Patient was to have surgery today for a left inguinal hernia.  He failed to show up to his preoperative visit  Labs were drawn by anesthesia at his arrival and he was found to have a chronically low hemoglobin of 6.2 and an elevated creatinine. Anesthesiology thus canceled his surgery as the procedure is elective and not emergent  Patient is advised to follow-up with his primary care provider

## 2020-11-01 NOTE — Progress Notes (Signed)
Dr. Grier Mitts, MD office notified of pt's surgery being cancelled this morning.  Notified Telithia, RN that pt's hemoglobin was 6.2, and pt's surgery was cancelled.  RN stated she would notify Dr. Volanda Napoleon of critical lab value.

## 2020-11-01 NOTE — Discharge Instructions (Signed)
Call your primary care provider and set up an appointment to be seen regarding your abnormal labs

## 2020-11-05 DIAGNOSIS — M25561 Pain in right knee: Secondary | ICD-10-CM | POA: Diagnosis not present

## 2020-11-07 ENCOUNTER — Other Ambulatory Visit: Payer: Self-pay | Admitting: Family Medicine

## 2020-11-08 ENCOUNTER — Emergency Department (HOSPITAL_COMMUNITY): Payer: Medicare (Managed Care)

## 2020-11-08 ENCOUNTER — Inpatient Hospital Stay (HOSPITAL_COMMUNITY)
Admission: EM | Admit: 2020-11-08 | Discharge: 2020-11-15 | DRG: 486 | Disposition: A | Discharge status: Home Health | Payer: Medicare (Managed Care) | Attending: Internal Medicine | Admitting: Internal Medicine

## 2020-11-08 ENCOUNTER — Other Ambulatory Visit: Payer: Self-pay | Admitting: Family Medicine

## 2020-11-08 ENCOUNTER — Other Ambulatory Visit: Payer: Self-pay

## 2020-11-08 ENCOUNTER — Encounter: Payer: Self-pay | Admitting: Family Medicine

## 2020-11-08 ENCOUNTER — Ambulatory Visit (INDEPENDENT_AMBULATORY_CARE_PROVIDER_SITE_OTHER): Payer: Medicare (Managed Care) | Admitting: Family Medicine

## 2020-11-08 VITALS — BP 102/50 | HR 98 | Temp 98.4°F | Wt 123.5 lb

## 2020-11-08 DIAGNOSIS — Z681 Body mass index (BMI) 19 or less, adult: Secondary | ICD-10-CM

## 2020-11-08 DIAGNOSIS — N179 Acute kidney failure, unspecified: Secondary | ICD-10-CM | POA: Diagnosis not present

## 2020-11-08 DIAGNOSIS — Z20822 Contact with and (suspected) exposure to covid-19: Secondary | ICD-10-CM | POA: Diagnosis not present

## 2020-11-08 DIAGNOSIS — M25561 Pain in right knee: Secondary | ICD-10-CM

## 2020-11-08 DIAGNOSIS — Z8249 Family history of ischemic heart disease and other diseases of the circulatory system: Secondary | ICD-10-CM | POA: Diagnosis not present

## 2020-11-08 DIAGNOSIS — Z8673 Personal history of transient ischemic attack (TIA), and cerebral infarction without residual deficits: Secondary | ICD-10-CM

## 2020-11-08 DIAGNOSIS — I471 Supraventricular tachycardia: Secondary | ICD-10-CM | POA: Diagnosis not present

## 2020-11-08 DIAGNOSIS — Z7982 Long term (current) use of aspirin: Secondary | ICD-10-CM | POA: Diagnosis not present

## 2020-11-08 DIAGNOSIS — Y831 Surgical operation with implant of artificial internal device as the cause of abnormal reaction of the patient, or of later complication, without mention of misadventure at the time of the procedure: Secondary | ICD-10-CM | POA: Diagnosis present

## 2020-11-08 DIAGNOSIS — D509 Iron deficiency anemia, unspecified: Secondary | ICD-10-CM | POA: Diagnosis not present

## 2020-11-08 DIAGNOSIS — Z79899 Other long term (current) drug therapy: Secondary | ICD-10-CM | POA: Diagnosis not present

## 2020-11-08 DIAGNOSIS — M00061 Staphylococcal arthritis, right knee: Secondary | ICD-10-CM

## 2020-11-08 DIAGNOSIS — T8453XA Infection and inflammatory reaction due to internal right knee prosthesis, initial encounter: Secondary | ICD-10-CM | POA: Diagnosis not present

## 2020-11-08 DIAGNOSIS — M25461 Effusion, right knee: Secondary | ICD-10-CM | POA: Diagnosis not present

## 2020-11-08 DIAGNOSIS — G8918 Other acute postprocedural pain: Secondary | ICD-10-CM | POA: Diagnosis not present

## 2020-11-08 DIAGNOSIS — I10 Essential (primary) hypertension: Secondary | ICD-10-CM | POA: Diagnosis not present

## 2020-11-08 DIAGNOSIS — E46 Unspecified protein-calorie malnutrition: Secondary | ICD-10-CM | POA: Diagnosis not present

## 2020-11-08 DIAGNOSIS — F17210 Nicotine dependence, cigarettes, uncomplicated: Secondary | ICD-10-CM | POA: Diagnosis not present

## 2020-11-08 DIAGNOSIS — Z9119 Patient's noncompliance with other medical treatment and regimen: Secondary | ICD-10-CM | POA: Diagnosis not present

## 2020-11-08 DIAGNOSIS — K7030 Alcoholic cirrhosis of liver without ascites: Secondary | ICD-10-CM | POA: Diagnosis present

## 2020-11-08 DIAGNOSIS — M7989 Other specified soft tissue disorders: Secondary | ICD-10-CM | POA: Diagnosis not present

## 2020-11-08 DIAGNOSIS — E860 Dehydration: Secondary | ICD-10-CM | POA: Diagnosis present

## 2020-11-08 DIAGNOSIS — K219 Gastro-esophageal reflux disease without esophagitis: Secondary | ICD-10-CM | POA: Diagnosis not present

## 2020-11-08 DIAGNOSIS — Z66 Do not resuscitate: Secondary | ICD-10-CM | POA: Diagnosis present

## 2020-11-08 DIAGNOSIS — E861 Hypovolemia: Secondary | ICD-10-CM | POA: Diagnosis present

## 2020-11-08 DIAGNOSIS — E871 Hypo-osmolality and hyponatremia: Secondary | ICD-10-CM | POA: Diagnosis not present

## 2020-11-08 DIAGNOSIS — M00861 Arthritis due to other bacteria, right knee: Secondary | ICD-10-CM

## 2020-11-08 DIAGNOSIS — D638 Anemia in other chronic diseases classified elsewhere: Secondary | ICD-10-CM | POA: Diagnosis present

## 2020-11-08 DIAGNOSIS — I252 Old myocardial infarction: Secondary | ICD-10-CM | POA: Diagnosis not present

## 2020-11-08 DIAGNOSIS — I2510 Atherosclerotic heart disease of native coronary artery without angina pectoris: Secondary | ICD-10-CM | POA: Diagnosis present

## 2020-11-08 DIAGNOSIS — Z452 Encounter for adjustment and management of vascular access device: Secondary | ICD-10-CM | POA: Diagnosis not present

## 2020-11-08 DIAGNOSIS — Z792 Long term (current) use of antibiotics: Secondary | ICD-10-CM | POA: Diagnosis not present

## 2020-11-08 DIAGNOSIS — M009 Pyogenic arthritis, unspecified: Secondary | ICD-10-CM

## 2020-11-08 DIAGNOSIS — D649 Anemia, unspecified: Secondary | ICD-10-CM | POA: Diagnosis not present

## 2020-11-08 DIAGNOSIS — K7460 Unspecified cirrhosis of liver: Secondary | ICD-10-CM | POA: Diagnosis present

## 2020-11-08 DIAGNOSIS — Z96651 Presence of right artificial knee joint: Secondary | ICD-10-CM | POA: Diagnosis not present

## 2020-11-08 DIAGNOSIS — R000 Tachycardia, unspecified: Secondary | ICD-10-CM | POA: Diagnosis not present

## 2020-11-08 DIAGNOSIS — E785 Hyperlipidemia, unspecified: Secondary | ICD-10-CM | POA: Diagnosis not present

## 2020-11-08 DIAGNOSIS — R700 Elevated erythrocyte sedimentation rate: Secondary | ICD-10-CM | POA: Diagnosis not present

## 2020-11-08 LAB — CBC WITH DIFFERENTIAL/PLATELET
Abs Immature Granulocytes: 0.01 10*3/uL (ref 0.00–0.07)
Basophils Absolute: 0 10*3/uL (ref 0.0–0.1)
Basophils Relative: 1 %
Eosinophils Absolute: 0.3 10*3/uL (ref 0.0–0.5)
Eosinophils Relative: 9 %
HCT: 17.8 % — ABNORMAL LOW (ref 39.0–52.0)
Hemoglobin: 5.7 g/dL — CL (ref 13.0–17.0)
Immature Granulocytes: 0 %
Lymphocytes Relative: 23 %
Lymphs Abs: 0.8 10*3/uL (ref 0.7–4.0)
MCH: 31.1 pg (ref 26.0–34.0)
MCHC: 32 g/dL (ref 30.0–36.0)
MCV: 97.3 fL (ref 80.0–100.0)
Monocytes Absolute: 0.5 10*3/uL (ref 0.1–1.0)
Monocytes Relative: 14 %
Neutro Abs: 1.7 10*3/uL (ref 1.7–7.7)
Neutrophils Relative %: 53 %
Platelets: 223 10*3/uL (ref 150–400)
RBC: 1.83 MIL/uL — ABNORMAL LOW (ref 4.22–5.81)
RDW: 16 % — ABNORMAL HIGH (ref 11.5–15.5)
WBC: 3.3 10*3/uL — ABNORMAL LOW (ref 4.0–10.5)
nRBC: 0 % (ref 0.0–0.2)

## 2020-11-08 LAB — COMPREHENSIVE METABOLIC PANEL
ALT: 9 U/L (ref 0–44)
AST: 23 U/L (ref 15–41)
Albumin: 2.2 g/dL — ABNORMAL LOW (ref 3.5–5.0)
Alkaline Phosphatase: 99 U/L (ref 38–126)
Anion gap: 9 (ref 5–15)
BUN: 15 mg/dL (ref 8–23)
CO2: 19 mmol/L — ABNORMAL LOW (ref 22–32)
Calcium: 8.1 mg/dL — ABNORMAL LOW (ref 8.9–10.3)
Chloride: 102 mmol/L (ref 98–111)
Creatinine, Ser: 1.18 mg/dL (ref 0.61–1.24)
GFR, Estimated: >60 mL/min (ref >60)
Glucose, Bld: 88 mg/dL (ref 70–99)
Potassium: 4.2 mmol/L (ref 3.5–5.1)
Sodium: 130 mmol/L — ABNORMAL LOW (ref 135–145)
Total Bilirubin: 0.5 mg/dL (ref 0.3–1.2)
Total Protein: 7.8 g/dL (ref 6.5–8.1)

## 2020-11-08 LAB — RESP PANEL BY RT-PCR (FLU A&B, COVID) ARPGX2
Influenza A by PCR: NEGATIVE
Influenza B by PCR: NEGATIVE
SARS Coronavirus 2 by RT PCR: NEGATIVE

## 2020-11-08 LAB — SEDIMENTATION RATE: Sed Rate: >140 mm/hr — ABNORMAL HIGH (ref 0–16)

## 2020-11-08 LAB — LACTIC ACID, PLASMA: Lactic Acid, Venous: 1.7 mmol/L (ref 0.5–1.9)

## 2020-11-08 LAB — C-REACTIVE PROTEIN: CRP: 3.6 mg/dL — ABNORMAL HIGH (ref <1.0)

## 2020-11-08 LAB — PREPARE RBC (CROSSMATCH)

## 2020-11-08 MED ORDER — SODIUM CHLORIDE 0.9 % IV BOLUS
500.0000 mL | Freq: Once | INTRAVENOUS | Status: AC
  Administered 2020-11-08: 500 mL via INTRAVENOUS

## 2020-11-08 MED ORDER — LIDOCAINE HCL (PF) 1 % IJ SOLN
5.0000 mL | Freq: Once | INTRAMUSCULAR | Status: AC
  Administered 2020-11-08: 5 mL
  Filled 2020-11-08: qty 5

## 2020-11-08 MED ORDER — SODIUM CHLORIDE 0.9 % IV SOLN
10.0000 mL/h | Freq: Once | INTRAVENOUS | Status: AC
  Administered 2020-11-08: 10 mL/h via INTRAVENOUS

## 2020-11-08 MED ORDER — METOPROLOL TARTRATE 5 MG/5ML IV SOLN
2.5000 mg | Freq: Once | INTRAVENOUS | Status: AC | PRN
  Administered 2020-11-08: 2.5 mg via INTRAVENOUS
  Filled 2020-11-08: qty 5

## 2020-11-08 MED ORDER — FENTANYL CITRATE (PF) 100 MCG/2ML IJ SOLN
50.0000 ug | Freq: Once | INTRAMUSCULAR | Status: DC
  Filled 2020-11-08: qty 2

## 2020-11-08 NOTE — ED Provider Notes (Signed)
[facility] EMERGENCY DEPARTMENT Provider Note   CSN: [id number] Arrival date & time: 11/08/20  1742     History Chief Complaint  Patient presents with   Abnormal Lab    Mark [name] is a 65 y.o. male.  Presents to ER with concern for knee pain.  Patient has history of right knee replacement, septic right knee.  In November had knee surgery to remove his knee replacement and placement of antibiotic spacer.  Then in April had revision and removal of the antibiotic spacer.  Patient reports that he had been doing well on his knee over the past couple months but then over the past week or so has noted worsening right knee pain and right knee swelling.  No chills or fevers.  Per review of chart he was supposed to have an outpatient inguinal surgery earlier this month.  Surgery was canceled due to anemia of 6.2.  Patient was instructed to follow-up with his primary care doctor.  Patient saw his primary care doctor today and was sent to ER due to PCPs concern for possible septic right knee.  Patient denies any blood in stools, does not feel fatigued, no syncope, no chest pain or difficulty in breathing.  HPI     Past Medical History:  Diagnosis Date   Alcohol abuse    Anemia    Arthritis    Ascites    Cirrhosis ([place])    Coffee ground emesis    Dehydration 06/17/2017   Febrile illness    GERD (gastroesophageal reflux disease)    Heart murmur    History of blood transfusion    Hyperlipidemia    Hypertension    Leg swelling    Myocardial infarction ([place]) 2012   Preop cardiovascular exam 04/14/2013   Sepsis ([place]) 06/17/2017   Septic shock ([place]) 06/18/2017   SIRS (systemic inflammatory response syndrome) ([place]) 07/11/2017   Stroke ([place]) 2012   no deficits   Thrombocytopenia ([place])     Patient Active Problem List   Diagnosis Date Noted   S/P right TKA reimplantation 08/02/2020   Infection of total left knee replacement ([place]) 03/20/2020   Cirrhosis of  liver without ascites ([place])    AKI (acute kidney injury) ([place])    Bacteremia    Spinal stenosis of lumbar region without neurogenic claudication    Aspiration pneumonia ([place]) 09/24/2018   Abscess in epidural space of lumbar spine    MRSA bacteremia 09/21/2018   Infection of prosthetic right knee joint ([place]) 09/20/2018   Anemia of chronic disease 09/20/2018   Hypoalbuminemia 09/20/2018   Hypoglycemia without diagnosis of diabetes mellitus 09/20/2018   Epidural abscess 09/20/2018   Hyperkalemia 05/18/2018   Edentulous 05/11/2018   Pancytopenia ([place]) 05/10/2018   CAD (coronary artery disease) 05/10/2018   Hepatic encephalopathy ([place]) 05/10/2018   Alcohol abuse 05/10/2018   GERD (gastroesophageal reflux disease) 05/10/2018   Duodenal ulcer    Renal failure    Acute on chronic anemia    Hypotension 06/17/2017   Hyponatremia 06/17/2017   Thrombocytopenia ([place]) 06/17/2017   Leg edema, right 58/52/7782   Acute metabolic encephalopathy 42/35/3614   Anemia, iron deficiency    Benign neoplasm of ascending colon    Hemorrhoids    Portal hypertensive gastropathy ([place])    Gastritis and gastroduodenitis    Esophageal varices without bleeding ([place])    H/O: CVA (cerebrovascular accident) 12/01/2013   ACS (acute coronary syndrome) ([place]) 12/01/2013   Anasarca 12/01/2013   Polysubstance abuse ([place])  12/01/2013   CKD (chronic kidney disease) stage 3, GFR 30-59 ml/min ([name]) 12/01/2013   S/P right TKA 05/02/2013   Murmur 04/14/2013   Right inguinal hernia 12/26/2010   Cirrhosis, alcoholic ([place]) 40/98/1191    Past Surgical History:  Procedure Laterality Date   COLONOSCOPY WITH PROPOFOL N/A 02/07/2016   Procedure: COLONOSCOPY WITH PROPOFOL;  Surgeon: Milus Banister, MD;  Location: [facility] ENDOSCOPY;  Service: Endoscopy;  Laterality: N/A;   ESOPHAGOGASTRODUODENOSCOPY (EGD) WITH PROPOFOL N/A 02/07/2016   Procedure: ESOPHAGOGASTRODUODENOSCOPY (EGD) WITH PROPOFOL;  Surgeon: Milus Banister, MD;   Location: [facility] ENDOS[facility];  Service: Endoscopy;  Laterality: N/A;   ESOPHAGOGASTRODUODENOSCOPY (EGD) WITH PROPOFOL N/A 06/22/2017   Procedure: ESOPHAGOGASTRODUODENOSCOPY (EGD) WITH PROPOFOL;  Surgeon: Jerene Bears, MD;  Location: [facility] ENDOSCOPY;  Service: Gastroenterology;  Laterality: N/A;   EXCISIONAL TOTAL KNEE ARTHROPLASTY WITH ANTIBIOTIC SPACERS Right 03/20/2020   Procedure: Resection right total knee arthroplasty and placement of antibiotic spacer;  Surgeon: Paralee Cancel, MD;  Location: [facility] ORS;  Service: Orthopedics;  Laterality: Right;  90 mins   HERNIA REPAIR Right    inguinal   KNEE ARTHROSCOPY     bilateral/  12/14   REIMPLANTATION OF TOTAL KNEE Right 08/02/2020   Procedure: REIMPLANTATION/REVISION OF TOTAL KNEE WITH REMOVAL OF ANTIBIOTIC SPACER;  Surgeon: Paralee Cancel, MD;  Location: [facility] ORS;  Service: Orthopedics;  Laterality: Right;  64mns   TEE WITHOUT CARDIOVERSION N/A 05/13/2018   Procedure: TRANSESOPHAGEAL ECHOCARDIOGRAM (TEE);  Surgeon: AElouise Munroe MD;  [facility]  Service: Cardiovascular;  Laterality: N/A;   TOTAL KNEE ARTHROPLASTY Right 05/02/2013   Procedure: RIGHT TOTAL KNEE ARTHROPLASTY;  Surgeon: MMauri Pole MD;  Location: [facility];  Service: Orthopedics;  Laterality: Right;       Family History  Problem Relation Age of Onset   Hypertension Father    Diabetes Father    Dementia Mother    Lupus Sister     Social History   Tobacco Use   Smoking status: Former    Packs/day: 0.50    Years: 10.00    Pack years: 5.00    Types: Cigarettes    Quit date: 01/05/2016    Years since quitting: 4.8   Smokeless tobacco: Never   Tobacco comments:    4 cigarettes a day  Vaping Use   Vaping Use: Never used  Substance Use Topics   Alcohol use: Yes    Comment: 2-3 beers/day   Drug use: Yes    Types: Marijuana    Comment: last smoked marijuana 10-22-20    Home Medications Prior to Admission medications   Medication Sig Start Date End Date Taking?  Authorizing Provider  aspirin 81 MG chewable tablet Chew 1 tablet (81 mg total) by mouth daily. 08/15/20   BBillie Ruddy MD  docusate sodium (COLACE) 100 MG capsule Take 1 capsule (100 mg total) by mouth 2 (two) times daily. 08/15/20   BBillie Ruddy MD  doxycycline (VIBRA-TABS) 100 MG tablet Take 1 tablet (100 mg total) by mouth 2 (two) times daily. Patient taking differently: Take 100 mg by mouth every 12 (twelve) hours. 05/03/20   CMichel Bickers MD  ferrous sulfate 325 (65 FE) MG tablet Take 1 tablet (325 mg total) by mouth 2 (two) times daily with a meal. Take for two weeks as tolerated. 08/15/20   BBillie Ruddy MD  folic acid (FOLVITE) 1 MG tablet TAKE 1 TABLET(1 MG) BY MOUTH DAILY Patient taking differently: Take 1 mg by mouth daily.  02/15/20   [name], MD  furosemide (LASIX) 20 MG tablet TAKE 1 TABLET(20 MG) BY MOUTH TWICE DAILY 08/23/20   [name], MD  nadolol (CORGARD) 20 MG tablet TAKE 1 TABLET BY MOUTH EVERY DAY 11/08/20   [name], MD  oxyCODONE (OXY IR/ROXICODONE) 5 MG immediate release tablet Take 1-2 tablets (5-10 mg total) by mouth every 4 (four) hours as needed for moderate pain (pain score 4-6). 08/04/20   Nicholes Stairs, MD  pantoprazole (PROTONIX) 40 MG tablet TAKE 1 TABLET BY MOUTH EVERY DAY Patient taking differently: Take 40 mg by mouth daily. 07/09/20   [name], MD    Allergies    Patient has no known allergies.  Review of Systems   Review of Systems  Constitutional:  Negative for chills and fever.  HENT:  Negative for ear pain and sore throat.   Eyes:  Negative for pain and visual disturbance.  Respiratory:  Negative for cough and shortness of breath.   Cardiovascular:  Negative for chest pain and palpitations.  Gastrointestinal:  Negative for abdominal pain and vomiting.  Genitourinary:  Negative for dysuria and hematuria.  Musculoskeletal:  Positive for arthralgias. Negative for back pain.  Skin:  Negative for color  change and rash.  Neurological:  Negative for seizures and syncope.  All other systems reviewed and are negative.  Physical Exam Updated Vital Signs BP 125/66 (BP Location: Right Arm)   Pulse 68   Temp 98.2 F (36.8 C) (Oral)   Resp 17   SpO2 100%   Physical Exam Vitals and nursing note reviewed.  Constitutional:      Appearance: He is well-developed.  HENT:     Head: Normocephalic and atraumatic.  Eyes:     Conjunctiva/sclera: Conjunctivae normal.  Cardiovascular:     Rate and Rhythm: Normal rate and regular rhythm.     Heart sounds: No murmur heard. Pulmonary:     Effort: Pulmonary effort is normal. No respiratory distress.     Breath sounds: Normal breath sounds.  Abdominal:     Palpations: Abdomen is soft.     Tenderness: There is no abdominal tenderness.  Musculoskeletal:     Cervical back: Neck supple.     Comments: Right knee has generalized swelling, there is decreased range of motion, held in slight flexion  Skin:    General: Skin is warm and dry.  Neurological:     General: No focal deficit present.     Mental Status: He is alert.  Psychiatric:        Mood and Affect: Mood normal.        Behavior: Behavior normal.    ED Results / Procedures / Treatments   Labs (all labs ordered are listed, but only abnormal results are displayed) Labs Reviewed  CBC WITH DIFFERENTIAL/PLATELET - Abnormal; Notable for the following components:      Result Value   WBC 3.3 (*)    RBC 1.83 (*)    Hemoglobin 5.7 (*)    HCT 17.8 (*)    RDW 16.0 (*)    All other components within normal limits  COMPREHENSIVE METABOLIC PANEL - Abnormal; Notable for the following components:   Sodium 130 (*)    CO2 19 (*)    Calcium 8.1 (*)    Albumin 2.2 (*)    All other components within normal limits  C-REACTIVE PROTEIN - Abnormal; Notable for the following components:   CRP 3.6 (*)    All other  components within normal limits  SEDIMENTATION RATE - Abnormal; Notable for the following  components:   Sed Rate >140 (*)    All other components within normal limits  CULTURE, BLOOD (ROUTINE X 2)  CULTURE, BLOOD (ROUTINE X 2)  RESP PANEL BY RT-PCR (FLU A&B, COVID) ARPGX2  LACTIC ACID, PLASMA    EKG EKG Interpretation  Date/Time:  Thursday November 08 2020 22:47:30 EDT Ventricular Rate:  128 PR Interval:    QRS Duration: 75 QT Interval:  310 QTC Calculation: 453 R Axis:   14 Text Interpretation: Supraventricular tachycardia Anteroseptal infarct, old ST depr, consider ischemia, inferior leads When compared with ECG of 03/13/2020, Supraventricular tachycardia has replaced Sinus rhythm ST depression in Inferior leads is now present - possibly rate-related Confirmed by Delora Fuel ([id number]) on 11/08/2020 11:09:20 PM  Radiology DG Knee Complete 4 Views Right  Result Date: 11/08/2020 CLINICAL DATA:  65 year old male with concern for right knee infection. EXAM: RIGHT KNEE - COMPLETE 4+ VIEW COMPARISON:  None. FINDINGS: There is a total right knee arthroplasty. The arthroplasty components appear intact and in anatomic alignment. There is approximately 2 mm gap between the bone and tibial plateau components of the hardware which may represent a degree of loosening or an infectious process. The bones are osteopenic. There is no acute fracture or dislocation. There is degenerative changes or heterotopic bone at the lateral tibial plateau. There is joint effusion and diffuse soft tissue swelling of the knee concerning for an infectious process. Clinical correlation is recommended. No soft tissue gas. IMPRESSION: 1. No acute fracture or dislocation. 2. Total right knee arthroplasty appears intact and in anatomic alignment. 3. Diffuse soft tissue swelling of the knee and joint effusion concerning for an infectious process. Clinical correlation is recommended. Electronically Signed   By: Anner Crete M.D.   On: 11/08/2020 19:33    Procedures .Joint Aspiration/Arthrocentesis  Date/Time:  11/09/2020 1:12 AM Performed by: Lucrezia Starch, MD Authorized by: Lucrezia Starch, MD   Consent:    Consent obtained:  Verbal   Consent given by:  Patient   Risks, benefits, and alternatives were discussed: yes     Risks discussed:  Bleeding, incomplete drainage, nerve damage, infection and poor cosmetic result   Alternatives discussed:  Alternative treatment, observation, delayed treatment and no treatment Universal protocol:    Immediately prior to procedure, a time out was called: yes     Patient identity confirmed:  Verbally with patient Location:    Location:  Knee   Knee:  R knee Anesthesia:    Anesthesia method:  Local infiltration   Local anesthetic:  Lidocaine 1% w/o epi Procedure details:    Preparation: Patient was prepped and draped in usual sterile fashion     Needle gauge: 21 gauage.   Ultrasound guidance: no     Approach: superior-lateral.   Aspirate amount:  86m   Aspirate characteristics: cloudy and bloody.   Steroid injected: no     Specimen collected: yes   Post-procedure details:    Procedure completion:  Tolerated well, no immediate complications .Critical Care  Date/Time: 11/09/2020 1:14 AM Performed by: DLucrezia Starch MD Authorized by: DLucrezia Starch MD   Critical care provider statement:    Critical care time (minutes):  42   Critical care was time spent personally by me on the following activities:  Discussions with consultants, evaluation of patient's response to treatment, examination of patient, ordering and performing treatments and interventions, ordering and review of laboratory studies,  ordering and review of radiographic studies, pulse oximetry, re-evaluation of patient's condition, obtaining history from patient or surrogate and review of old charts   Medications Ordered in ED Medications - No data to display  ED Course  I have reviewed the triage vital signs and the nursing notes.  Pertinent labs & imaging results that  were available during my care of the patient were reviewed by me and considered in my medical decision making (see chart for details).    MDM Rules/Calculators/A&P                          65 year old male presents to ER with multiple issues.  Regarding his right knee pain, he does have swelling, no overlying erythema but does have range of joint motion decreased.  Given his past history, highly suspicious for septic knee.  ESR is profoundly elevated.  On his other labs, he has significant anemia.  This seems to be more of a chronic process.  Denies any acute blood loss and does not have any specific anemia symptoms.  Given both of these issues, believe patient would benefit from admission for further management.  I discussed the case in detail with Dr. Stann Mainland on-call for [facility].  He requests that I complete a knee arthrocentesis this evening and send off knee studies.  Further requests that patient be admitted to [facility] for that patient's primary orthopod, Dr. Alvan Dame can evalute there.  He recommends holding antibiotics for now.  For his anemia, provided 1 unit of blood for now. Consult [facility] for admit.    Final Clinical Impression(s) / ED Diagnoses Final diagnoses:  Acute pain of right knee  ESR raised  Anemia, unspecified type    Rx / DC Orders ED Discharge Orders     None        Lucrezia Starch, MD 11/09/20 [phone]

## 2020-11-08 NOTE — ED Provider Notes (Signed)
Emergency Medicine Provider Triage Evaluation Note  Haris Baack , a 65 y.o. male  was evaluated in triage.  Pt complains of right knee pain associated with edema and erythema. Patient seen by PCP and told to report to the ED for IV antibiotics due to concerns for possible septic joint. Patient notes pain started 6 days ago. Per PCP note, he had a total knee replacement in November and has had recurrent bouts of infections in his knee and spine (MSSA and MRSA). He saw Dr. Valerie Salts with ID in February and was advised to take doxycycline the rest of his life. Patient notes he ran out of doxycycline 3 weeks ago. No fever or chills.   Review of Systems  Positive: Arthralgia, joint edema Negative: fever  Physical Exam  BP 104/83   Pulse 88   Temp 99 F (37.2 C) (Oral)   Resp 16   SpO2 100%  Gen:   Awake, no distress   Resp:  Normal effort  MSK:   Moves extremities without difficulty  Other:  Erythematous, edematous and warmth right knee. Able to flex and extend knee  Medical Decision Making  Medically screening exam initiated at 6:06 PM.  Appropriate orders placed.  [name] was informed that the remainder of the evaluation will be completed by another provider, this initial triage assessment does not replace that evaluation, and the importance of remaining in the ED until their evaluation is complete.  Possible septic joint. Labs, blood cultures, and lactic acid ordered. X-ray ordered. Inflammatory markers.    Karie Kirks 11/08/20 Dionicia Abler    Dorie Rank, MD 11/11/20 [phone]

## 2020-11-08 NOTE — ED Triage Notes (Signed)
Pt c/o "blood infection" after he was advised of test results by surgeon for hernia repair. Went to PCP, advised of hernia, referred to specialist, advised of abnormal labs related to clotting & infection.

## 2020-11-08 NOTE — Progress Notes (Signed)
   Subjective:    Patient ID: Mark [name], male    DOB: 04-Jan-1956, 65 y.o.   MRN: [id number]  HPI Here with his wife for several days of swelling and pain in the right knee. He is S/P a total knee replacement last November, and he has a had recurrent bouts of infections in the knee and the spine with both MSSA and MRSA. He saw Dr. Megan Salon in Infectious Disease in February, and he was doing well at that time. He was to take Doxycycline 100 mg BID for the rest of his life. That day his CRP was 1.6 and the ESR was 43. However he ran out of Doxycycline about 3 weeks ago and did not pick up a refill until yesterday. He took one dose last night and another this morning. He denies any fever.    Review of Systems  Constitutional: Negative.   Respiratory: Negative.    Cardiovascular: Negative.   Musculoskeletal:  Positive for arthralgias and joint swelling.      Objective:   Physical Exam Constitutional:      Comments: In a wheelchair   Cardiovascular:     Rate and Rhythm: Normal rate and regular rhythm.     Pulses: Normal pulses.     Heart sounds: Normal heart sounds.  Pulmonary:     Effort: Pulmonary effort is normal.     Breath sounds: Normal breath sounds.  Musculoskeletal:     Comments: The right knee is swollen, warm, and tender. No erythema   Neurological:     Mental Status: He is alert.          Assessment & Plan:  He likely has another septic knee and he will require IV antibiotics. He will arrange for his daughter to drive him to [facility] ER immediately for further evaluation. We spent 35 minutes reviewing records and discussing these issues.  Alysia Penna, MD

## 2020-11-08 NOTE — ED Notes (Signed)
EDP Knapp aware of Hgn

## 2020-11-08 NOTE — ED Notes (Signed)
[name] chart (consult note) reveals possible septic R knee, PA notified, to place appropriate orders

## 2020-11-08 NOTE — ED Notes (Signed)
Synovial fluid walked down to lab

## 2020-11-09 ENCOUNTER — Encounter (HOSPITAL_COMMUNITY): Payer: Self-pay | Admitting: Family Medicine

## 2020-11-09 DIAGNOSIS — D649 Anemia, unspecified: Secondary | ICD-10-CM

## 2020-11-09 LAB — CBC
HCT: 21.8 % — ABNORMAL LOW (ref 39.0–52.0)
Hemoglobin: 7.1 g/dL — ABNORMAL LOW (ref 13.0–17.0)
MCH: 31.1 pg (ref 26.0–34.0)
MCHC: 32.6 g/dL (ref 30.0–36.0)
MCV: 95.6 fL (ref 80.0–100.0)
Platelets: 228 10*3/uL (ref 150–400)
RBC: 2.28 MIL/uL — ABNORMAL LOW (ref 4.22–5.81)
RDW: 16.1 % — ABNORMAL HIGH (ref 11.5–15.5)
WBC: 2.7 10*3/uL — ABNORMAL LOW (ref 4.0–10.5)
nRBC: 0 % (ref 0.0–0.2)

## 2020-11-09 LAB — TYPE AND SCREEN
ABO/RH(D): B POS
Antibody Screen: NEGATIVE
Unit division: 0

## 2020-11-09 LAB — BASIC METABOLIC PANEL
Anion gap: 5 (ref 5–15)
BUN: 16 mg/dL (ref 8–23)
CO2: 22 mmol/L (ref 22–32)
Calcium: 8.1 mg/dL — ABNORMAL LOW (ref 8.9–10.3)
Chloride: 108 mmol/L (ref 98–111)
Creatinine, Ser: 1.09 mg/dL (ref 0.61–1.24)
GFR, Estimated: >60 mL/min (ref >60)
Glucose, Bld: 73 mg/dL (ref 70–99)
Potassium: 4.5 mmol/L (ref 3.5–5.1)
Sodium: 135 mmol/L (ref 135–145)

## 2020-11-09 LAB — BPAM RBC
Blood Product Expiration Date: 202208062359
ISSUE DATE / TIME: 202207142342
Unit Type and Rh: 7300

## 2020-11-09 LAB — RETICULOCYTES
Immature Retic Fract: 17.4 % — ABNORMAL HIGH (ref 2.3–15.9)
RBC.: 2.23 MIL/uL — ABNORMAL LOW (ref 4.22–5.81)
Retic Count, Absolute: 56.4 10*3/uL (ref 19.0–186.0)
Retic Ct Pct: 2.5 % (ref 0.4–3.1)

## 2020-11-09 LAB — IRON AND TIBC
Iron: 193 ug/dL — ABNORMAL HIGH (ref 45–182)
Saturation Ratios: 85 % — ABNORMAL HIGH (ref 17.9–39.5)
TIBC: 227 ug/dL — ABNORMAL LOW (ref 250–450)
UIBC: 34 ug/dL

## 2020-11-09 LAB — SYNOVIAL CELL COUNT + DIFF, W/ CRYSTALS
Crystals, Fluid: NONE SEEN
Eosinophils-Synovial: 1 % (ref 0–1)
Lymphocytes-Synovial Fld: 1 % (ref 0–20)
Monocyte-Macrophage-Synovial Fluid: 4 % — ABNORMAL LOW (ref 50–90)
Neutrophil, Synovial: 94 % — ABNORMAL HIGH (ref 0–25)
WBC, Synovial: 28200 /mm3 — ABNORMAL HIGH (ref 0–200)

## 2020-11-09 LAB — FERRITIN: Ferritin: 239 ng/mL (ref 24–336)

## 2020-11-09 LAB — HIV ANTIBODY (ROUTINE TESTING W REFLEX): HIV Screen 4th Generation wRfx: NONREACTIVE

## 2020-11-09 LAB — FOLATE: Folate: 22.5 ng/mL (ref >5.9)

## 2020-11-09 LAB — VITAMIN B12: Vitamin B-12: 617 pg/mL (ref 180–914)

## 2020-11-09 MED ORDER — OXYCODONE HCL 5 MG PO TABS
5.0000 mg | ORAL_TABLET | ORAL | Status: DC | PRN
  Administered 2020-11-09 – 2020-11-10 (×3): 10 mg via ORAL
  Filled 2020-11-09 (×3): qty 2

## 2020-11-09 MED ORDER — ACETAMINOPHEN 325 MG PO TABS: 650.0000 mg | ORAL_TABLET | Freq: Four times a day (QID) | ORAL | Status: DC | PRN

## 2020-11-09 MED ORDER — PANTOPRAZOLE SODIUM 40 MG PO TBEC
40.0000 mg | DELAYED_RELEASE_TABLET | Freq: Every day | ORAL | Status: DC
  Administered 2020-11-09 – 2020-11-15 (×6): 40 mg via ORAL
  Filled 2020-11-09 (×6): qty 1

## 2020-11-09 MED ORDER — PIPERACILLIN-TAZOBACTAM 3.375 G IVPB: 3.3750 g | Freq: Three times a day (TID) | INTRAVENOUS | Status: DC

## 2020-11-09 MED ORDER — PIPERACILLIN-TAZOBACTAM 3.375 G IVPB: 3.3750 g | Freq: Four times a day (QID) | INTRAVENOUS | Status: DC

## 2020-11-09 MED ORDER — ACETAMINOPHEN 650 MG RE SUPP: 650.0000 mg | Freq: Four times a day (QID) | RECTAL | Status: DC | PRN

## 2020-11-09 MED ORDER — PIPERACILLIN-TAZOBACTAM 3.375 G IVPB 30 MIN
3.3750 g | Freq: Once | INTRAVENOUS | Status: AC
  Administered 2020-11-09: 3.375 g via INTRAVENOUS
  Filled 2020-11-09: qty 50

## 2020-11-09 MED ORDER — VANCOMYCIN HCL IN DEXTROSE 1-5 GM/200ML-% IV SOLN
1000.0000 mg | INTRAVENOUS | Status: DC
  Administered 2020-11-10: 1000 mg via INTRAVENOUS
  Filled 2020-11-09: qty 200

## 2020-11-09 MED ORDER — VANCOMYCIN HCL 1250 MG/250ML IV SOLN
1250.0000 mg | Freq: Once | INTRAVENOUS | Status: AC
  Administered 2020-11-09: 1250 mg via INTRAVENOUS
  Filled 2020-11-09: qty 250

## 2020-11-09 MED ORDER — NADOLOL 20 MG PO TABS: 20.0000 mg | ORAL_TABLET | Freq: Every day | ORAL | Status: DC

## 2020-11-09 MED ORDER — ONDANSETRON HCL 4 MG PO TABS: 4.0000 mg | ORAL_TABLET | Freq: Four times a day (QID) | ORAL | Status: DC | PRN

## 2020-11-09 MED ORDER — PIPERACILLIN-TAZOBACTAM 3.375 G IVPB
3.3750 g | Freq: Three times a day (TID) | INTRAVENOUS | Status: DC
  Administered 2020-11-09 – 2020-11-10 (×2): 3.375 g via INTRAVENOUS
  Filled 2020-11-09 (×3): qty 50

## 2020-11-09 MED ORDER — ONDANSETRON HCL 4 MG/2ML IJ SOLN: 4.0000 mg | Freq: Four times a day (QID) | INTRAMUSCULAR | Status: DC | PRN

## 2020-11-09 NOTE — H&P (Signed)
History and Physical    Mark [name] TKW:[id number] DOB: [birth year] DOA: 11/08/2020  PCP: Mark Ruddy, MD   Patient coming from: Home   Chief Complaint: right knee pain and swelling   HPI: Mark [name] is a 65 y.o. male with medical history significant for alcoholic liver cirrhosis and anemia presenting with increasing pain and swelling in the right knee.  Patient has history of right total knee arthroplasty in 2015 which became infected and was removed in November 2021 with placement of antibiotic spacer and treatment with IV antibiotics.  In April 2022, he underwent replacement of the TKA.  He had been on lifelong doxycycline at the direction of ID but ran out approximately 3 weeks ago.  He went on to develop pain and swelling in the right knee which has increased over the past week.  Patient reports that he has cut back on his alcohol intake significantly but continues to drink beer a couple times a week, noting that it will take him all day to drink a 40 ounce.  He denies any withdrawal symptoms in the past couple years.  He denies fevers, chills, chest pain, shortness of breath, melena, hematochezia, abdominal pain, or vomiting.  ED Course: Upon arrival to the ED, patient is found to be afebrile, saturating well on room air, and with blood pressure 102/50.  EKG features supraventricular tachycardia.  Radiographs of the right knee are negative for acute fracture or dislocation, apparently intact TKA, and diffuse soft tissue swelling and joint effusion concerning for infection.  Chemistry panel with sodium 130 and albumin 2.2.  CBC notable for WBC of 3300 and hemoglobin 5.7.  Lactic acid was normal, CRP 3.6, and ESR >140.  Orthopedic surgery was consulted by the ED physician and recommended arthrocentesis with synovial fluid studies, with holding antibiotics for now, and medical admission to [facility].  1 unit RBC transfusion was started in the ED.  Review of Systems:   All other systems reviewed and apart from HPI, are negative.  Past Medical History:  Diagnosis Date   Alcohol abuse    Anemia    Arthritis    Ascites    Cirrhosis ([place])    Coffee ground emesis    Dehydration 06/17/2017   Febrile illness    GERD (gastroesophageal reflux disease)    Heart murmur    History of blood transfusion    Hyperlipidemia    Hypertension    Leg swelling    Myocardial infarction ([place]) 2012   Preop cardiovascular exam 04/14/2013   Sepsis ([place]) 06/17/2017   Septic shock ([place]) 06/18/2017   SIRS (systemic inflammatory response syndrome) ([place]) 07/11/2017   Stroke ([place]) 2012   no deficits   Thrombocytopenia ([place])     Past Surgical History:  Procedure Laterality Date   COLONOSCOPY WITH PROPOFOL N/A 02/07/2016   Procedure: COLONOSCOPY WITH PROPOFOL;  Surgeon: Milus Banister, MD;  Location: [facility];  Service: Endoscopy;  Laterality: N/A;   ESOPHAGOGASTRODUODENOSCOPY (EGD) WITH PROPOFOL N/A 02/07/2016   Procedure: ESOPHAGOGASTRODUODENOSCOPY (EGD) WITH PROPOFOL;  Surgeon: Milus Banister, MD;  Location: [facility];  Service: Endoscopy;  Laterality: N/A;   ESOPHAGOGASTRODUODENOSCOPY (EGD) WITH PROPOFOL N/A 06/22/2017   Procedure: ESOPHAGOGASTRODUODENOSCOPY (EGD) WITH PROPOFOL;  Surgeon: Jerene Bears, MD;  Location: [facility] ENDOSCOPY;  Service: Gastroenterology;  Laterality: N/A;   EXCISIONAL TOTAL KNEE ARTHROPLASTY WITH ANTIBIOTIC SPACERS Right 03/20/2020   Procedure: Resection right total knee arthroplasty and placement of antibiotic spacer;  Surgeon: Paralee Cancel, MD;  Location: [facility];  Service: Orthopedics;  Laterality: Right;  90 mins   HERNIA REPAIR Right    inguinal   KNEE ARTHROSCOPY     bilateral/  12/14   REIMPLANTATION OF TOTAL KNEE Right 08/02/2020   Procedure: REIMPLANTATION/REVISION OF TOTAL KNEE WITH REMOVAL OF ANTIBIOTIC SPACER;  Surgeon: Paralee Cancel, MD;  Location: [facility];  Service: Orthopedics;  Laterality: Right;  82mns   TEE WITHOUT  CARDIOVERSION N/A 05/13/2018   Procedure: TRANSESOPHAGEAL ECHOCARDIOGRAM (TEE);  Surgeon: AElouise Munroe MD;  [facility]  Service: Cardiovascular;  Laterality: N/A;   TOTAL KNEE ARTHROPLASTY Right 05/02/2013   Procedure: RIGHT TOTAL KNEE ARTHROPLASTY;  Surgeon: MMauri Pole MD;  Location: [facility];  Service: Orthopedics;  Laterality: Right;    Social History:   reports that he quit smoking about 4 years ago. His smoking use included cigarettes. He has a 5.00 pack-year smoking history. He has never used smokeless tobacco. He reports current alcohol use. He reports current drug use. Drug: Marijuana.  No Known Allergies  Family History  Problem Relation Age of Onset   Hypertension Father    Diabetes Father    Dementia Mother    Lupus Sister      Prior to Admission medications   Medication Sig Start Date End Date Taking? Authorizing Provider  aspirin 81 MG chewable tablet Chew 1 tablet (81 mg total) by mouth daily. 08/15/20  Yes BBillie Ruddy MD  celecoxib (CELEBREX) 200 MG capsule Take 200 mg by mouth daily. 09/27/20  Yes [provider]  docusate sodium (COLACE) 100 MG capsule Take 1 capsule (100 mg total) by mouth 2 (two) times daily. 08/15/20  Yes BBillie Ruddy MD  doxycycline (VIBRA-TABS) 100 MG tablet Take 1 tablet (100 mg total) by mouth 2 (two) times daily. Patient taking differently: Take 100 mg by mouth every 12 (twelve) hours. 05/03/20  Yes CMichel Bickers MD  ferrous sulfate 325 (65 FE) MG tablet Take 1 tablet (325 mg total) by mouth 2 (two) times daily with a meal. Take for two weeks as tolerated. 08/15/20  Yes BBillie Ruddy MD  folic acid (FOLVITE) 1 MG tablet TAKE 1 TABLET(1 MG) BY MOUTH DAILY Patient taking differently: Take 1 mg by mouth daily. 02/15/20  Yes BBillie Ruddy MD  furosemide (LASIX) 20 MG tablet TAKE 1 TABLET(20 MG) BY MOUTH TWICE DAILY Patient taking differently: Take 20 mg by mouth 2 (two) times daily. 08/23/20  Yes BBillie Ruddy MD  naproxen (NAPROSYN) 500 MG tablet Take 500 mg by mouth 2 (two) times daily. 09/25/20  Yes [provider]  pantoprazole (PROTONIX) 40 MG tablet TAKE 1 TABLET BY MOUTH EVERY DAY Patient taking differently: Take 40 mg by mouth daily. 07/09/20  Yes BBillie Ruddy MD  nadolol (CORGARD) 20 MG tablet TAKE 1 TABLET BY MOUTH EVERY DAY Patient not taking: Reported on 11/08/2020 11/08/20   BBillie Ruddy MD  oxyCODONE (OXY IR/ROXICODONE) 5 MG immediate release tablet Take 1-2 tablets (5-10 mg total) by mouth every 4 (four) hours as needed for moderate pain (pain score 4-6). Patient not taking: Reported on 11/08/2020 08/04/20   RNicholes Stairs MD    Physical Exam: Vitals:   11/08/20 2300 11/08/20 2315 11/08/20 2351 11/09/20 0009  BP: 128/78  114/64 (!) 111/56  Pulse: (!) 137 (!) 146 78 82  Resp: '16 11 11 14  ' Temp:   97.9 F (36.6 C) 97.9 F (36.6 C)  TempSrc:  Oral Oral  SpO2: 100% 100% 100% 100%    Constitutional: NAD, calm  Eyes: PERTLA, lids and conjunctivae normal ENMT: Mucous membranes are moist. Posterior pharynx clear of any exudate or lesions.   Neck:  supple, no masses  Respiratory:  no wheezing, no crackles. No accessory muscle use.  Cardiovascular: S1 & S2 heard, regular rate and rhythm. No extremity edema.   Abdomen: soft, no tenderness. Bowel sounds active.  Musculoskeletal: no clubbing / cyanosis. Right knee swelling, heat, and tenderness.   Skin: no significant rashes, lesions, ulcers. Warm, dry, well-perfused. Neurologic: CN 2-12 grossly intact. Sensation intact. Moving all extremities.  Psychiatric: Alert and oriented to person, place, and situation. Pleasant and cooperative.    Labs and Imaging on Admission: I have personally reviewed following labs and imaging studies  CBC: Recent Labs  Lab 11/08/20 1803  WBC 3.3*  NEUTROABS 1.7  HGB 5.7*  HCT 17.8*  MCV 97.3  PLT 160   Basic Metabolic Panel: Recent Labs  Lab 11/08/20 1803   NA 130*  K 4.2  CL 102  CO2 19*  GLUCOSE 88  BUN 15  CREATININE 1.18  CALCIUM 8.1*   GFR: Estimated Creatinine Clearance: 48.1 mL/min (by C-G formula based on SCr of 1.18 mg/dL). Liver Function Tests: Recent Labs  Lab 11/08/20 1803  AST 23  ALT 9  ALKPHOS 99  BILITOT 0.5  PROT 7.8  ALBUMIN 2.2*   No results for input(s): LIPASE, AMYLASE in the last 168 hours. No results for input(s): AMMONIA in the last 168 hours. Coagulation Profile: No results for input(s): INR, PROTIME in the last 168 hours. Cardiac Enzymes: No results for input(s): CKTOTAL, CKMB, CKMBINDEX, TROPONINI in the last 168 hours. BNP (last 3 results) No results for input(s): PROBNP in the last 8760 hours. HbA1C: No results for input(s): HGBA1C in the last 72 hours. CBG: No results for input(s): GLUCAP in the last 168 hours. Lipid Profile: No results for input(s): CHOL, HDL, LDLCALC, TRIG, CHOLHDL, LDLDIRECT in the last 72 hours. Thyroid Function Tests: No results for input(s): TSH, T4TOTAL, FREET4, T3FREE, THYROIDAB in the last 72 hours. Anemia Panel: No results for input(s): VITAMINB12, FOLATE, FERRITIN, TIBC, IRON, RETICCTPCT in the last 72 hours. Urine analysis:    Component Value Date/Time   COLORURINE YELLOW 12/23/2018 0533   APPEARANCEUR CLEAR 12/23/2018 0533   LABSPEC 1.011 12/23/2018 0533   PHURINE 6.0 12/23/2018 0533   GLUCOSEU NEGATIVE 12/23/2018 0533   HGBUR MODERATE (A) 12/23/2018 0533   BILIRUBINUR NEGATIVE 12/23/2018 0533   KETONESUR NEGATIVE 12/23/2018 0533   PROTEINUR NEGATIVE 12/23/2018 0533   UROBILINOGEN 2.0 (H) 04/26/2013 0945   NITRITE NEGATIVE 12/23/2018 0533   LEUKOCYTESUR NEGATIVE 12/23/2018 0533   Sepsis Labs: '@LABRCNTIP' (procalcitonin:4,lacticidven:4) ) Recent Results (from the past 240 hour(s))  Resp Panel by RT-PCR (Flu A&B, Covid) Nasopharyngeal Swab     Status: None   Collection Time: 11/08/20  9:03 PM   Specimen: Nasopharyngeal Swab; Nasopharyngeal(NP) swabs  in vial transport medium  Result Value Ref Range Status   SARS Coronavirus 2 by RT PCR NEGATIVE NEGATIVE Final    Comment: (NOTE) SARS-CoV-2 target nucleic acids are NOT DETECTED.  The SARS-CoV-2 RNA is generally detectable in upper respiratory specimens during the acute phase of infection. The lowest concentration of SARS-CoV-2 viral copies this assay can detect is 138 copies/mL. A negative result does not preclude SARS-Cov-2 infection and should not be used as the sole basis for treatment or other patient management decisions. A negative result may  occur with  improper specimen collection/handling, submission of specimen other than nasopharyngeal swab, presence of viral mutation(s) within the areas targeted by this assay, and inadequate number of viral copies(<138 copies/mL). A negative result must be combined with clinical observations, patient history, and epidemiological information. The expected result is Negative.  Fact Sheet for Patients:  EntrepreneurPulse.com.au  Fact Sheet for Healthcare Providers:  IncredibleEmployment.be  This test is no t yet approved or cleared by the [place] FDA and  has been authorized for detection and/or diagnosis of SARS-CoV-2 by FDA under an Emergency Use Authorization (EUA). This EUA will remain  in effect (meaning this test can be used) for the duration of the COVID-19 declaration under Section 564(b)(1) of the Act, 21 U.S.C.section 360bbb-3(b)(1), unless the authorization is terminated  or revoked sooner.       Influenza A by PCR NEGATIVE NEGATIVE Final   Influenza B by PCR NEGATIVE NEGATIVE Final    Comment: (NOTE) The Xpert Xpress SARS-CoV-2/FLU/RSV plus assay is intended as an aid in the diagnosis of influenza from Nasopharyngeal swab specimens and should not be used as a sole basis for treatment. Nasal washings and aspirates are unacceptable for Xpert Xpress  SARS-CoV-2/FLU/RSV testing.  Fact Sheet for Patients: EntrepreneurPulse.com.au  Fact Sheet for Healthcare Providers: IncredibleEmployment.be  This test is not yet approved or cleared by the [place] FDA and has been authorized for detection and/or diagnosis of SARS-CoV-2 by FDA under an Emergency Use Authorization (EUA). This EUA will remain in effect (meaning this test can be used) for the duration of the COVID-19 declaration under Section 564(b)(1) of the Act, 21 U.S.C. section 360bbb-3(b)(1), unless the authorization is terminated or revoked.  Performed at [facility], [place] [address] [place], [place] [address]      Radiological Exams on Admission: DG Knee Complete 4 Views Right  Result Date: 11/08/2020 CLINICAL DATA:  65 year old male with concern for right knee infection. EXAM: RIGHT KNEE - COMPLETE 4+ VIEW COMPARISON:  None. FINDINGS: There is a total right knee arthroplasty. The arthroplasty components appear intact and in anatomic alignment. There is approximately 2 mm gap between the bone and tibial plateau components of the hardware which may represent a degree of loosening or an infectious process. The bones are osteopenic. There is no acute fracture or dislocation. There is degenerative changes or heterotopic bone at the lateral tibial plateau. There is joint effusion and diffuse soft tissue swelling of the knee concerning for an infectious process. Clinical correlation is recommended. No soft tissue gas. IMPRESSION: 1. No acute fracture or dislocation. 2. Total right knee arthroplasty appears intact and in anatomic alignment. 3. Diffuse soft tissue swelling of the knee and joint effusion concerning for an infectious process. Clinical correlation is recommended. Electronically Signed   By: Anner Crete M.D.   On: 11/08/2020 19:33    EKG: Independently reviewed. SVT.   Assessment/Plan   1. Septic right knee  - Patient with  hx of infected right TKA on lifelong doxycycline presents with increasing right knee pain and swelling over the past week after running out of doxycycline 3 weeks ago  - Appreciate orthopedic surgery consultants, recommending arthrocentesis with synovial fluid studies and holding antibiotics for now     2. SVT  - Patient went into SVT in ED, was asymptomatic with stable BP  - Vagal maneuvers unsuccessful, returned to NSR with 2.5 mg IV Lopressor    3. Anemia  - Hgb is 5.7 on admission without overt bleeding  -  Hx of GIB in 2019, had EGD then with no varices, mild portal hypertensive gastropathy, and acute ulcerative duodenitis but denies recent abdominal pain or N/V and denies melena or hematochezia  - 1 unit RBC is transfusing in ED  - Add anemia panel to pre-transfusion blood draw, check FOBT, avoid NSAID, continue PPI    4. Cirrhosis  - Appears compensated, attributed to alcohol  - Continue nadolol, hold Lasix, encourage strict alcohol avoidance  5. CAD - No anginal complaint, hold ASA while evaluating anemia and potential need for orthopedic surgery    6. Hyponatremia  - Serum sodium is 130 in setting of hypovolemia  - Given 500 cc NS in ED, hold Lasix, monitor    DVT prophylaxis: SCDs  Code Status: DNR, confirmed on admission  Level of Care: Level of care: Telemetry Family Communication: none present  Disposition Plan:  Patient is from: home  Anticipated d/c is to: TBD Anticipated d/c date is: 11/10/20 Patient currently: Pending synovial fluid studies, orthopedic surgery eval  Consults called: Orthopedic surgery  Admission status: Inpatient     Vianne Bulls, MD [facility]  11/09/2020, 12:55 AM

## 2020-11-09 NOTE — Progress Notes (Addendum)
Pharmacy Antibiotic Note  Mark [name] is a 65 y.o. male admitted on 11/08/2020 with right knee pain s/p TKA in April 2022. Pt is on lifelong doxy, but ran out a few weeks ago. Pharmacy has been consulted for vancomycin dosing for septic knee joint.  Plan: TBW 56kg IBW 66.1kg Scr 1.09 CrCl 52 Lactic Acid 1.7  Vancomycin 1250 mg IV once Vancomycin 1000 mg q24h (eAUC 520.7)  Monitor renal fxn, cx results, clinical progression, LOT   Height: 5\' 7"  (170.2 cm) Weight: 56 kg (123 lb 7.3 oz) IBW/kg (Calculated) : 66.1  Temp (24hrs), Avg:98.2 F (36.8 C), Min:97.8 F (36.6 C), Max:99 F (37.2 C)  Recent Labs  Lab 11/08/20 1803 11/09/20 0513  WBC 3.3* 2.7*  CREATININE 1.18 1.09  LATICACIDVEN 1.7  --     Estimated Creatinine Clearance: 52.1 mL/min (by C-G formula based on SCr of 1.09 mg/dL).    No Known Allergies  Antimicrobials this admission: 7/15 Vancomycin >>  7/15 Zosyn >>  Microbiology results: 7/ BCx: Pending  Thank you for allowing pharmacy to be a part of this patient's care.  Marlowe Alt, PharmD Candidate 11/09/2020 10:37 AM

## 2020-11-09 NOTE — ED Notes (Signed)
Pt resting at this time. VS WNL. Visible chest rise and fall noted. Pt appears in NAD.  

## 2020-11-09 NOTE — ED Notes (Signed)
[facility] called by Keimya Briddell pt is on the list

## 2020-11-09 NOTE — Consult Note (Signed)
Reason for Consult:Right knee pain Referring Physician: Madalyn Rob Time called: 0730 Time at bedside: Mark [name] is an 65 y.o. male.  HPI: Zacharey comes in with a 1 week hx/o right knee pain. He woke up last Friday morning and could hardly walk on it. He denies any hx/o trauma and was doing very well with it prior to that. He's been managing at home with ice and elevation which has helped quite a bit but the pain has worsened in the last 3d and he came in for evaluation. 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 He denies fevers, chills, sweats, N/V.  Past Medical History:  Diagnosis Date   Alcohol abuse    Anemia    Arthritis    Ascites    Cirrhosis ([place])    Coffee ground emesis    Dehydration 06/17/2017   Febrile illness    GERD (gastroesophageal reflux disease)    Heart murmur    History of blood transfusion    Hyperlipidemia    Hypertension    Leg swelling    Myocardial infarction ([place]) 2012   Preop cardiovascular exam 04/14/2013   Sepsis ([place]) 06/17/2017   Septic shock ([place]) 06/18/2017   SIRS (systemic inflammatory response syndrome) ([place]) 07/11/2017   Stroke ([place]) 2012   no deficits   Thrombocytopenia ([place])     Past Surgical History:  Procedure Laterality Date   COLONOSCOPY WITH PROPOFOL N/A 02/07/2016   Procedure: COLONOSCOPY WITH PROPOFOL;  Surgeon: Milus Banister, MD;  Location: [facility];  Service: Endoscopy;  Laterality: N/A;   ESOPHAGOGASTRODUODENOSCOPY (EGD) WITH PROPOFOL N/A 02/07/2016   Procedure: ESOPHAGOGASTRODUODENOSCOPY (EGD) WITH PROPOFOL;  Surgeon: Milus Banister, MD;  Location: [facility];  Service: Endoscopy;  Laterality: N/A;   ESOPHAGOGASTRODUODENOSCOPY (EGD) WITH PROPOFOL N/A 06/22/2017   Procedure: ESOPHAGOGASTRODUODENOSCOPY (EGD) WITH PROPOFOL;  Surgeon: Jerene Bears, MD;  Location: [facility] ENDOSCOPY;  Service: Gastroenterology;  Laterality: N/A;   EXCISIONAL  TOTAL KNEE ARTHROPLASTY WITH ANTIBIOTIC SPACERS Right 03/20/2020   Procedure: Resection right total knee arthroplasty and placement of antibiotic spacer;  Surgeon: Paralee Cancel, MD;  Location: [facility];  Service: Orthopedics;  Laterality: Right;  90 mins   HERNIA REPAIR Right    inguinal   KNEE ARTHROSCOPY     bilateral/  12/14   REIMPLANTATION OF TOTAL KNEE Right 08/02/2020   Procedure: REIMPLANTATION/REVISION OF TOTAL KNEE WITH REMOVAL OF ANTIBIOTIC SPACER;  Surgeon: Paralee Cancel, MD;  Location: [facility];  Service: Orthopedics;  Laterality: Right;  56mins   TEE WITHOUT CARDIOVERSION N/A 05/13/2018   Procedure: TRANSESOPHAGEAL ECHOCARDIOGRAM (TEE);  Surgeon: [name], MD;  Location: [facility];  Service: Cardiovascular;  Laterality: N/A;   TOTAL KNEE ARTHROPLASTY Right 05/02/2013   Procedure: RIGHT TOTAL KNEE ARTHROPLASTY;  Surgeon: Mauri Pole, MD;  Location: [facility];  Service: Orthopedics;  Laterality: Right;    Family History  Problem Relation Age of Onset   Hypertension Father    Diabetes Father    Dementia Mother    Lupus Sister     Social History:  reports that he quit smoking about 4 years ago. His smoking use included cigarettes. He has a 5.00 pack-year smoking history. He has never used smokeless tobacco. He reports current alcohol use. He reports current drug use. Drug: Marijuana.  Allergies: No Known Allergies  Medications: I have reviewed the patient's current medications.  Results for orders placed or  performed during the hospital encounter of 11/08/20 (from the past 48 hour(s))  CBC with Differential     Status: Abnormal   Collection Time: 11/08/20  6:03 PM  Result Value Ref Range   WBC 3.3 (L) 4.0 - 10.5 K/uL   RBC 1.83 (L) 4.22 - 5.81 MIL/uL   Hemoglobin 5.7 (LL) 13.0 - 17.0 g/dL    Comment: CRITICAL VALUE NOTED.  VALUE IS CONSISTENT WITH PREVIOUSLY REPORTED AND CALLED VALUE. REPEATED TO VERIFY CRITICAL RESULT CALLED TO, READ BACK BY AND VERIFIED  WITH: MEGAN RUGGELILA,RN 1906 11/08/2020 WBOND    HCT 17.8 (L) 39.0 - 52.0 %   MCV 97.3 80.0 - 100.0 fL   MCH 31.1 26.0 - 34.0 pg   MCHC 32.0 30.0 - 36.0 g/dL   RDW 16.0 (H) 11.5 - 15.5 %   Platelets 223 150 - 400 K/uL   nRBC 0.0 0.0 - 0.2 %   Neutrophils Relative % 53 %   Neutro Abs 1.7 1.7 - 7.7 K/uL   Lymphocytes Relative 23 %   Lymphs Abs 0.8 0.7 - 4.0 K/uL   Monocytes Relative 14 %   Monocytes Absolute 0.5 0.1 - 1.0 K/uL   Eosinophils Relative 9 %   Eosinophils Absolute 0.3 0.0 - 0.5 K/uL   Basophils Relative 1 %   Basophils Absolute 0.0 0.0 - 0.1 K/uL   Immature Granulocytes 0 %   Abs Immature Granulocytes 0.01 0.00 - 0.07 K/uL    Comment: Performed at [facility] [address] [place], [place] [address]  Comprehensive metabolic panel     Status: Abnormal   Collection Time: 11/08/20  6:03 PM  Result Value Ref Range   Sodium 130 (L) 135 - 145 mmol/L   Potassium 4.2 3.5 - 5.1 mmol/L   Chloride 102 98 - 111 mmol/L   CO2 19 (L) 22 - 32 mmol/L   Glucose, Bld 88 70 - 99 mg/dL    Comment: Glucose reference range applies only to samples taken after fasting for at least 8 hours.   BUN 15 8 - 23 mg/dL   Creatinine, Ser 1.18 0.61 - 1.24 mg/dL   Calcium 8.1 (L) 8.9 - 10.3 mg/dL   Total Protein 7.8 6.5 - 8.1 g/dL   Albumin 2.2 (L) 3.5 - 5.0 g/dL   AST 23 15 - 41 U/L   ALT 9 0 - 44 U/L   Alkaline Phosphatase 99 38 - 126 U/L   Total Bilirubin 0.5 0.3 - 1.2 mg/dL   GFR, Estimated >60 >60 mL/min    Comment: (NOTE) Calculated using the CKD-EPI Creatinine Equation (2021)    Anion gap 9 5 - 15    Comment: Performed at [facility] [address] [place], [place] [address]  Lactic acid, plasma     Status: None   Collection Time: 11/08/20  6:03 PM  Result Value Ref Range   Lactic Acid, Venous 1.7 0.5 - 1.9 mmol/L    Comment: Performed at [facility] [address], [place], [place] [address]  C-reactive protein     Status: Abnormal   Collection Time:  11/08/20  6:03 PM  Result Value Ref Range   CRP 3.6 (H) <1.0 mg/dL    Comment: Performed at [facility] [address], [place], [place] [address]  Sedimentation rate     Status: Abnormal   Collection Time: 11/08/20  6:03 PM  Result Value Ref Range   Sed Rate >140 (H) 0 -  16 mm/hr    Comment: Performed at [facility], [place] [address], [place], [place] [address]  Blood culture (routine x 2)     Status: None (Preliminary result)   Collection Time: 11/08/20  6:15 PM   Specimen: BLOOD LEFT ARM  Result Value Ref Range   Specimen Description BLOOD LEFT ARM    Special Requests      BOTTLES DRAWN AEROBIC AND ANAEROBIC Blood Culture adequate volume   Culture      NO GROWTH < 12 HOURS Performed at [facility], [place] [address] [place], [place] [address]    Report Status PENDING   Resp Panel by RT-PCR (Flu A&B, Covid) Nasopharyngeal Swab     Status: None   Collection Time: 11/08/20  9:03 PM   Specimen: Nasopharyngeal Swab; Nasopharyngeal(NP) swabs in vial transport medium  Result Value Ref Range   SARS Coronavirus 2 by RT PCR NEGATIVE NEGATIVE    Comment: (NOTE) SARS-CoV-2 target nucleic acids are NOT DETECTED.  The SARS-CoV-2 RNA is generally detectable in upper respiratory specimens during the acute phase of infection. The lowest concentration of SARS-CoV-2 viral copies this assay can detect is 138 copies/mL. A negative result does not preclude SARS-Cov-2 infection and should not be used as the sole basis for treatment or other patient management decisions. A negative result may occur with  improper specimen collection/handling, submission of specimen other than nasopharyngeal swab, presence of viral mutation(s) within the areas targeted by this assay, and inadequate number of viral copies(<138 copies/mL). A negative result must be combined with clinical observations, patient history, and epidemiological information. The expected result is Negative.  Fact Sheet  for Patients:  EntrepreneurPulse.com.au  Fact Sheet for Healthcare Providers:  IncredibleEmployment.be  This test is no t yet approved or cleared by the [place] FDA and  has been authorized for detection and/or diagnosis of SARS-CoV-2 by FDA under an Emergency Use Authorization (EUA). This EUA will remain  in effect (meaning this test can be used) for the duration of the COVID-19 declaration under Section 564(b)(1) of the Act, 21 U.S.C.section 360bbb-3(b)(1), unless the authorization is terminated  or revoked sooner.       Influenza A by PCR NEGATIVE NEGATIVE   Influenza B by PCR NEGATIVE NEGATIVE    Comment: (NOTE) The Xpert Xpress SARS-CoV-2/FLU/RSV plus assay is intended as an aid in the diagnosis of influenza from Nasopharyngeal swab specimens and should not be used as a sole basis for treatment. Nasal washings and aspirates are unacceptable for Xpert Xpress SARS-CoV-2/FLU/RSV testing.  Fact Sheet for Patients: EntrepreneurPulse.com.au  Fact Sheet for Healthcare Providers: IncredibleEmployment.be  This test is not yet approved or cleared by the [place] FDA and has been authorized for detection and/or diagnosis of SARS-CoV-2 by FDA under an Emergency Use Authorization (EUA). This EUA will remain in effect (meaning this test can be used) for the duration of the COVID-19 declaration under Section 564(b)(1) of the Act, 21 U.S.C. section 360bbb-3(b)(1), unless the authorization is terminated or revoked.  Performed at [facility], [place] [address], [place], [place] [address]   Prepare RBC (crossmatch)     Status: None   Collection Time: 11/08/20 10:08 PM  Result Value Ref Range   Order Confirmation      ORDER PROCESSED BY BLOOD BANK Performed at [facility], [place] [address], [place], [place] [address]   Blood culture (routine x 2)     Status: None (Preliminary result)  Collection Time: 11/08/20 10:23 PM   Specimen: BLOOD RIGHT FOREARM  Result Value Ref Range   Specimen Description BLOOD RIGHT FOREARM    Special Requests      BOTTLES DRAWN AEROBIC AND ANAEROBIC Blood Culture adequate volume   Culture      NO GROWTH < 12 HOURS Performed at [facility], [place] [address], [place], [place] [address]    Report Status PENDING   Type and screen [place]     Status: None   Collection Time: 11/08/20 10:23 PM  Result Value Ref Range   ABO/RH(D) B POS    Antibody Screen NEG    Sample Expiration 11/11/2020,2359    Unit Number [id number]    Blood Component Type RED CELLS,LR    Unit division 00    Status of Unit ISSUED,FINAL    Transfusion Status OK TO TRANSFUSE    Crossmatch Result      Compatible Performed at [facility], [place] [address] [place], [place] [address]   Synovial cell count + diff, w/ crystals     Status: Abnormal   Collection Time: 11/08/20 11:00 PM  Result Value Ref Range   Color, Synovial RED (A) YELLOW   Appearance-Synovial TURBID (A) CLEAR   Crystals, Fluid NO CRYSTALS SEEN    WBC, Synovial 28,200 (H) 0 - 200 /cu mm   Neutrophil, Synovial 94 (H) 0 - 25 %   Lymphocytes-Synovial Fld 1 0 - 20 %   Monocyte-Macrophage-Synovial Fluid 4 (L) 50 - 90 %   Eosinophils-Synovial 1 0 - 1 %    Comment: Performed at [facility] [address] [place], [place] [address]  Basic metabolic panel     Status: Abnormal   Collection Time: 11/09/20  5:13 AM  Result Value Ref Range   Sodium 135 135 - 145 mmol/L   Potassium 4.5 3.5 - 5.1 mmol/L   Chloride 108 98 - 111 mmol/L   CO2 22 22 - 32 mmol/L   Glucose, Bld 73 70 - 99 mg/dL    Comment: Glucose reference range applies only to samples taken after fasting for at least 8 hours.   BUN 16 8 - 23 mg/dL   Creatinine, Ser 1.09 0.61 - 1.24 mg/dL   Calcium 8.1 (L) 8.9 - 10.3 mg/dL   GFR, Estimated >60 >60 mL/min    Comment: (NOTE) Calculated using the CKD-EPI  Creatinine Equation (2021)    Anion gap 5 5 - 15    Comment: Performed at [facility] [address], [place], [place] [address]  CBC     Status: Abnormal   Collection Time: 11/09/20  5:13 AM  Result Value Ref Range   WBC 2.7 (L) 4.0 - 10.5 K/uL   RBC 2.28 (L) 4.22 - 5.81 MIL/uL   Hemoglobin 7.1 (L) 13.0 - 17.0 g/dL    Comment: REPEATED TO VERIFY   HCT 21.8 (L) 39.0 - 52.0 %   MCV 95.6 80.0 - 100.0 fL   MCH 31.1 26.0 - 34.0 pg   MCHC 32.6 30.0 - 36.0 g/dL   RDW 16.1 (H) 11.5 - 15.5 %   Platelets 228 150 - 400 K/uL   nRBC 0.0 0.0 - 0.2 %    Comment: Performed at [facility] [address] [place], [place] [address]  Folate     Status: None   Collection Time: 11/09/20  5:13 AM  Result Value Ref Range   Folate 22.5 >  5.9 ng/mL    Comment: Performed at [facility], [place] [address] [place], [place] [address]  Reticulocytes     Status: Abnormal   Collection Time: 11/09/20  5:13 AM  Result Value Ref Range   Retic Ct Pct 2.5 0.4 - 3.1 %   RBC. 2.23 (L) 4.22 - 5.81 MIL/uL   Retic Count, Absolute 56.4 19.0 - 186.0 K/uL   Immature Retic Fract 17.4 (H) 2.3 - 15.9 %    Comment: Performed at [facility] [address], [place], [place] [address]  HIV Antibody (routine testing w rflx)     Status: None   Collection Time: 11/09/20  6:26 AM  Result Value Ref Range   HIV Screen 4th Generation wRfx Non Reactive Non Reactive    Comment: Performed at [facility], [place] [address], [place], [place] [address]  Vitamin B12     Status: None   Collection Time: 11/09/20  6:26 AM  Result Value Ref Range   Vitamin B-12 617 180 - 914 pg/mL    Comment: (NOTE) This assay is not validated for testing neonatal or myeloproliferative syndrome specimens for Vitamin B12 levels. Performed at [facility], [place] [address], [place], [place] [address]   Iron and TIBC     Status: Abnormal   Collection Time: 11/09/20  6:26 AM  Result Value Ref Range   Iron 193 (H) 45 -  182 ug/dL   TIBC 227 (L) 250 - 450 ug/dL   Saturation Ratios 85 (H) 17.9 - 39.5 %   UIBC 34 ug/dL    Comment: Performed at [facility] [name] [place], [place] [address]  Ferritin     Status: None   Collection Time: 11/09/20  6:26 AM  Result Value Ref Range   Ferritin 239 24 - 336 ng/mL    Comment: Performed at [facility] [address] [place], [place] [address]    DG Knee Complete 4 Views Right  Result Date: 11/08/2020 CLINICAL DATA:  65 year old male with concern for right knee infection. EXAM: RIGHT KNEE - COMPLETE 4+ VIEW COMPARISON:  None. FINDINGS: There is a total right knee arthroplasty. The arthroplasty components appear intact and in anatomic alignment. There is approximately 2 mm gap between the bone and tibial plateau components of the hardware which may represent a degree of loosening or an infectious process. The bones are osteopenic. There is no acute fracture or dislocation. There is degenerative changes or heterotopic bone at the lateral tibial plateau. There is joint effusion and diffuse soft tissue swelling of the knee concerning for an infectious process. Clinical correlation is recommended. No soft tissue gas. IMPRESSION: 1. No acute fracture or dislocation. 2. Total right knee arthroplasty appears intact and in anatomic alignment. 3. Diffuse soft tissue swelling of the knee and joint effusion concerning for an infectious process. Clinical correlation is recommended. Electronically Signed   By: Anner Crete M.D.   On: 11/08/2020 19:33    Review of Systems  Constitutional:  Negative for chills, diaphoresis and fever.  HENT:  Negative for ear discharge, ear pain, hearing loss and tinnitus.   Eyes:  Negative for photophobia and pain.  Respiratory:  Negative for cough and shortness of breath.   Cardiovascular:  Negative for chest pain.  Gastrointestinal:  Negative for abdominal pain, nausea and vomiting.  Genitourinary:  Negative for dysuria,  flank pain, frequency and urgency.  Musculoskeletal:  Positive for arthralgias (Right knee).  Negative for back pain, myalgias and neck pain.  Neurological:  Negative for dizziness and headaches.  Hematological:  Does not bruise/bleed easily.  Psychiatric/Behavioral:  The patient is not nervous/anxious.   Blood pressure (!) 104/52, pulse 65, temperature 97.8 F (36.6 C), temperature source Oral, resp. rate 11, height 5\' 7"  (1.702 m), weight 56 kg, SpO2 100 %. Physical Exam Constitutional:      General: He is not in acute distress.    Appearance: He is well-developed. He is not diaphoretic.  HENT:     Head: Normocephalic and atraumatic.  Eyes:     General: No scleral icterus.       Right eye: No discharge.        Left eye: No discharge.     Conjunctiva/sclera: Conjunctivae normal.  Cardiovascular:     Rate and Rhythm: Normal rate and regular rhythm.  Pulmonary:     Effort: Pulmonary effort is normal. No respiratory distress.  Musculoskeletal:     Cervical back: Normal range of motion.     Comments: RLE No traumatic wounds, ecchymosis, or rash  Mod TTP knee med/lat joint line  Mild knee effusion  Knee stable to varus/ valgus and anterior/posterior stress but painful, able to PROM knee 90 degrees painless  Sens DPN, SPN, TN intact  Motor EHL, ext, flex, evers 5/5  DP 2+, PT 2+, No significant edema  Skin:    General: Skin is warm and dry.  Neurological:     Mental Status: He is alert.  Psychiatric:        Mood and Affect: Mood normal.        Behavior: Behavior normal.    Assessment/Plan: Right knee pain -- Plan for transfer to [facility] and evaluation by Dr. Alvan Dame with possible wash out Sunday. Start vanc/zosyn in meantime.    Lisette Abu, PA-C Orthopedic Surgery [phone] 11/09/2020, 9:22 AM

## 2020-11-09 NOTE — Progress Notes (Signed)
PROGRESS NOTE    Mark [name]  TDH:[id number] DOB: [birth year] DOA: 11/08/2020 PCP: [name], MD   Brief Narrative:  Mark [name] is a 65 y.o. male with medical history significant for alcoholic liver cirrhosis and anemia presenting with increasing pain and swelling in the right knee.  Patient has history of right total knee arthroplasty in 2015 which became infected and was removed in November 2021 with placement of antibiotic spacer and treatment with IV antibiotics.  In April 2022, he underwent replacement of the TKA.  He had been on lifelong doxycycline at the direction of ID but ran out approximately 3 weeks ago due to issues of cost/being unable to afford medications despite continued alcohol use/abuse. Ortho recommended arthrocentesis with synovial fluid studies, admission to [facility].  1 unit RBC transfusion was started in the ED.  Assessment & Plan:   Principal Problem:   Septic joint of right knee joint ([place]) Active Problems:   Hyponatremia   Acute on chronic anemia   CAD (coronary artery disease)   Cirrhosis of liver without ascites ([name])   Septic right knee, POA - Patient with hx of infected right TKA on lifelong doxycycline presents with increasing right knee pain and swelling over the past week after running out of doxycycline 3 weeks ago - Appreciate orthopedic surgery consultants, recommending arthrocentesis with synovial fluid studies - vanc/zosyn until follow up for potential washout  - tentatively planned Sunday 11/11/20   SVT, transient, POA  -Transient SVT event in the ED, asymptomatic, required IV Lopressor x1 dose, remains in normal sinus rhythm   Acute on chronic normocytic anemia, likely chronic anemia of chronic disease/malnutrition Cannot rule out acute blood loss  - Hgb is 5.7 on admission without signs or symptoms of overt bleeding, history of GI bleed 2019 status post EGD given mild portal hypertension as well as known  history of ulcerative duodenitis.  Denies any black tarry stools abdominal pain or hematemesis -Status post 1 unit RBC -continue to follow morning labs -B12 folate within normal limits, iron panel unremarkable  Alcohol use/abuse, notable history of cirrhosis and mild portal hypertension - Continue nadolol, hold Lasix, encourage strict alcohol avoidance  CAD - No anginal complaint, hold ASA while evaluating anemia and potential need for orthopedic surgery     Hyponatremia, hypovolemic  - Serum sodium is 130 in setting of dehydration/poor PO intake vs ?beer potomania given history of alcohol use/abuse  - Given 500 cc NS in ED, hold Lasix, follow repeat labs     DVT prophylaxis: SCDs  Code Status: DNR, confirmed on admission  Family Communication: None present  Status is: Inpatient  Dispo: The patient is from: Home              Anticipated d/c is to: To be determined              Anticipated d/c date is: 72+ hours given need for ongoing evaluation possible procedure with orthopedic surgery              Patient currently not medically stable for discharge  Consultants:  Orthopedic surgery  Procedures:  Tentative right knee washout planned 11/11/2020  Antimicrobials:  Vanco, Zosyn  Subjective: No acute issues or events overnight, denies nausea vomiting diarrhea constipation headache fevers or chills.  Right knee pain ongoing but minimally improving.  Main complaint this morning is about diet which we discussed at the time was on hold given his possible need for procedure.  Objective: Vitals:  11/09/20 0345 11/09/20 0515 11/09/20 0529 11/09/20 0630  BP: 108/63 121/65  (!) 104/52  Pulse: 65 72  65  Resp:  11  11  Temp: 97.8 F (36.6 C)     TempSrc: Oral     SpO2: 100% 100%  100%  Weight:   56 kg   Height:   5\' 7"  (1.702 m)     Intake/Output Summary (Last 24 hours) at 11/09/2020 0728 Last data filed at 11/09/2020 0529 Gross per 24 hour  Intake 315 ml  Output 0 ml  Net  315 ml   Filed Weights   11/09/20 0529  Weight: 56 kg    Examination:  General:  Pleasantly resting in bed, No acute distress. HEENT:  Normocephalic atraumatic.  Sclerae nonicteric, noninjected.  Extraocular movements intact bilaterally. Neck:  Without mass or deformity.  Trachea is midline. Lungs:  Clear to auscultate bilaterally without rhonchi, wheeze, or rales. Heart:  Regular rate and rhythm.  Without murmurs, rubs, or gallops. Abdomen:  Soft, nontender, nondistended.  Without guarding or rebound. Extremities: Right knee swollen, tender to palpation, decreased range of motion. Vascular:  Dorsalis pedis and posterior tibial pulses weakly palpable bilaterally. Skin:  Warm and dry, no erythema, no ulcerations.  Data Reviewed: I have personally reviewed following labs and imaging studies  CBC: Recent Labs  Lab 11/08/20 1803 11/09/20 0513  WBC 3.3* 2.7*  NEUTROABS 1.7  --   HGB 5.7* 7.1*  HCT 17.8* 21.8*  MCV 97.3 95.6  PLT 223 195   Basic Metabolic Panel: Recent Labs  Lab 11/08/20 1803 11/09/20 0513  NA 130* 135  K 4.2 4.5  CL 102 108  CO2 19* 22  GLUCOSE 88 73  BUN 15 16  CREATININE 1.18 1.09  CALCIUM 8.1* 8.1*   GFR: Estimated Creatinine Clearance: 52.1 mL/min (by C-G formula based on SCr of 1.09 mg/dL). Liver Function Tests: Recent Labs  Lab 11/08/20 1803  AST 23  ALT 9  ALKPHOS 99  BILITOT 0.5  PROT 7.8  ALBUMIN 2.2*   No results for input(s): LIPASE, AMYLASE in the last 168 hours. No results for input(s): AMMONIA in the last 168 hours. Coagulation Profile: No results for input(s): INR, PROTIME in the last 168 hours. Cardiac Enzymes: No results for input(s): CKTOTAL, CKMB, CKMBINDEX, TROPONINI in the last 168 hours. BNP (last 3 results) No results for input(s): PROBNP in the last 8760 hours. HbA1C: No results for input(s): HGBA1C in the last 72 hours. CBG: No results for input(s): GLUCAP in the last 168 hours. Lipid Profile: No results for  input(s): CHOL, HDL, LDLCALC, TRIG, CHOLHDL, LDLDIRECT in the last 72 hours. Thyroid Function Tests: No results for input(s): TSH, T4TOTAL, FREET4, T3FREE, THYROIDAB in the last 72 hours. Anemia Panel: Recent Labs    11/09/20 0513  RETICCTPCT 2.5   Sepsis Labs: Recent Labs  Lab 11/08/20 1803  LATICACIDVEN 1.7    Recent Results (from the past 240 hour(s))  Resp Panel by RT-PCR (Flu A&B, Covid) Nasopharyngeal Swab     Status: None   Collection Time: 11/08/20  9:03 PM   Specimen: Nasopharyngeal Swab; Nasopharyngeal(NP) swabs in vial transport medium  Result Value Ref Range Status   SARS Coronavirus 2 by RT PCR NEGATIVE NEGATIVE Final    Comment: (NOTE) SARS-CoV-2 target nucleic acids are NOT DETECTED.  The SARS-CoV-2 RNA is generally detectable in upper respiratory specimens during the acute phase of infection. The lowest concentration of SARS-CoV-2 viral copies this assay can detect is 138 copies/mL.  A negative result does not preclude SARS-Cov-2 infection and should not be used as the sole basis for treatment or other patient management decisions. A negative result may occur with  improper specimen collection/handling, submission of specimen other than nasopharyngeal swab, presence of viral mutation(s) within the areas targeted by this assay, and inadequate number of viral copies(<138 copies/mL). A negative result must be combined with clinical observations, patient history, and epidemiological information. The expected result is Negative.  Fact Sheet for Patients:  EntrepreneurPulse.com.au  Fact Sheet for Healthcare Providers:  IncredibleEmployment.be  This test is no t yet approved or cleared by the [place] FDA and  has been authorized for detection and/or diagnosis of SARS-CoV-2 by FDA under an Emergency Use Authorization (EUA). This EUA will remain  in effect (meaning this test can be used) for the duration of the COVID-19  declaration under Section 564(b)(1) of the Act, 21 U.S.C.section 360bbb-3(b)(1), unless the authorization is terminated  or revoked sooner.       Influenza A by PCR NEGATIVE NEGATIVE Final   Influenza B by PCR NEGATIVE NEGATIVE Final    Comment: (NOTE) The Xpert Xpress SARS-CoV-2/FLU/RSV plus assay is intended as an aid in the diagnosis of influenza from Nasopharyngeal swab specimens and should not be used as a sole basis for treatment. Nasal washings and aspirates are unacceptable for Xpert Xpress SARS-CoV-2/FLU/RSV testing.  Fact Sheet for Patients: EntrepreneurPulse.com.au  Fact Sheet for Healthcare Providers: IncredibleEmployment.be  This test is not yet approved or cleared by the [place] FDA and has been authorized for detection and/or diagnosis of SARS-CoV-2 by FDA under an Emergency Use Authorization (EUA). This EUA will remain in effect (meaning this test can be used) for the duration of the COVID-19 declaration under Section 564(b)(1) of the Act, 21 U.S.C. section 360bbb-3(b)(1), unless the authorization is terminated or revoked.  Performed at [facility], [place] [address], [place], [place] [address]     Radiology Studies: DG Knee Complete 4 Views Right  Result Date: 11/08/2020 CLINICAL DATA:  65 year old male with concern for right knee infection. EXAM: RIGHT KNEE - COMPLETE 4+ VIEW COMPARISON:  None. FINDINGS: There is a total right knee arthroplasty. The arthroplasty components appear intact and in anatomic alignment. There is approximately 2 mm gap between the bone and tibial plateau components of the hardware which may represent a degree of loosening or an infectious process. The bones are osteopenic. There is no acute fracture or dislocation. There is degenerative changes or heterotopic bone at the lateral tibial plateau. There is joint effusion and diffuse soft tissue swelling of the knee concerning for an infectious  process. Clinical correlation is recommended. No soft tissue gas. IMPRESSION: 1. No acute fracture or dislocation. 2. Total right knee arthroplasty appears intact and in anatomic alignment. 3. Diffuse soft tissue swelling of the knee and joint effusion concerning for an infectious process. Clinical correlation is recommended. Electronically Signed   By: Anner Crete M.D.   On: 11/08/2020 19:33    Scheduled Meds:  pantoprazole  40 mg Oral Daily    LOS: 1 day   Time spent: 39min  Littleton Haub C Tayton Decaire, DO [facility]  If 7PM-7AM, please contact night-coverage [email or web address]  11/09/2020, 7:28 AM

## 2020-11-09 NOTE — ED Notes (Signed)
Report given to [facility]. Pt transported to [facility] via [facility].

## 2020-11-10 DIAGNOSIS — M25561 Pain in right knee: Secondary | ICD-10-CM

## 2020-11-10 LAB — CBC
HCT: 21.6 % — ABNORMAL LOW (ref 39.0–52.0)
Hemoglobin: 6.8 g/dL — CL (ref 13.0–17.0)
MCH: 30.8 pg (ref 26.0–34.0)
MCHC: 31.5 g/dL (ref 30.0–36.0)
MCV: 97.7 fL (ref 80.0–100.0)
Platelets: 196 10*3/uL (ref 150–400)
RBC: 2.21 MIL/uL — ABNORMAL LOW (ref 4.22–5.81)
RDW: 16.3 % — ABNORMAL HIGH (ref 11.5–15.5)
WBC: 2.9 10*3/uL — ABNORMAL LOW (ref 4.0–10.5)
nRBC: 0 % (ref 0.0–0.2)

## 2020-11-10 LAB — BASIC METABOLIC PANEL
Anion gap: 6 (ref 5–15)
BUN: 18 mg/dL (ref 8–23)
CO2: 22 mmol/L (ref 22–32)
Calcium: 7.9 mg/dL — ABNORMAL LOW (ref 8.9–10.3)
Chloride: 112 mmol/L — ABNORMAL HIGH (ref 98–111)
Creatinine, Ser: 1.45 mg/dL — ABNORMAL HIGH (ref 0.61–1.24)
GFR, Estimated: 53 mL/min — ABNORMAL LOW (ref >60)
Glucose, Bld: 109 mg/dL — ABNORMAL HIGH (ref 70–99)
Potassium: 4 mmol/L (ref 3.5–5.1)
Sodium: 140 mmol/L (ref 135–145)

## 2020-11-10 LAB — PREPARE RBC (CROSSMATCH)

## 2020-11-10 MED ORDER — DOCUSATE SODIUM 100 MG PO CAPS
100.0000 mg | ORAL_CAPSULE | Freq: Two times a day (BID) | ORAL | Status: DC
  Administered 2020-11-10: 100 mg via ORAL
  Filled 2020-11-10 (×5): qty 1

## 2020-11-10 MED ORDER — NADOLOL 20 MG PO TABS
20.0000 mg | ORAL_TABLET | Freq: Every day | ORAL | Status: DC
  Administered 2020-11-10 – 2020-11-11 (×2): 20 mg via ORAL
  Filled 2020-11-10 (×2): qty 1

## 2020-11-10 MED ORDER — ACETAMINOPHEN 500 MG PO TABS: 500.0000 mg | ORAL_TABLET | Freq: Four times a day (QID) | ORAL | Status: DC | PRN

## 2020-11-10 MED ORDER — SODIUM CHLORIDE 0.9 % IV SOLN
2.0000 g | Freq: Two times a day (BID) | INTRAVENOUS | Status: DC
  Administered 2020-11-10 – 2020-11-12 (×5): 2 g via INTRAVENOUS
  Filled 2020-11-10 (×5): qty 2

## 2020-11-10 MED ORDER — TRAMADOL HCL 50 MG PO TABS
50.0000 mg | ORAL_TABLET | Freq: Four times a day (QID) | ORAL | Status: DC | PRN
Start: 2020-11-10 — End: 2020-11-13

## 2020-11-10 MED ORDER — FOLIC ACID 1 MG PO TABS
1.0000 mg | ORAL_TABLET | Freq: Every day | ORAL | Status: DC
  Administered 2020-11-10 – 2020-11-15 (×5): 1 mg via ORAL
  Filled 2020-11-10 (×5): qty 1

## 2020-11-10 MED ORDER — SODIUM CHLORIDE 0.9% IV SOLUTION: Freq: Once | INTRAVENOUS | Status: DC

## 2020-11-10 MED ORDER — OXYCODONE HCL 5 MG PO TABS
5.0000 mg | ORAL_TABLET | ORAL | Status: DC | PRN
  Administered 2020-11-10 – 2020-11-13 (×16): 10 mg via ORAL
  Filled 2020-11-10 (×16): qty 2

## 2020-11-10 MED ORDER — VANCOMYCIN HCL 750 MG/150ML IV SOLN
750.0000 mg | INTRAVENOUS | Status: DC
  Administered 2020-11-11: 750 mg via INTRAVENOUS
  Filled 2020-11-10: qty 150

## 2020-11-10 NOTE — Progress Notes (Signed)
   Subjective:    Patient reports pain as mild.   Patient seen in rounds for Dr. Alvan Dame. Patient reports swelling in the knee has improved. Pain currently controlled.  Objective: Vital signs in last 24 hours: Temp:  [98.1 F (36.7 C)-98.3 F (36.8 C)] 98.3 F (36.8 C) (07/16 0609) Pulse Rate:  [62-72] 71 (07/16 0609) Resp:  [8-18] 15 (07/16 0609) BP: (102-125)/(39-71) 125/65 (07/16 0609) SpO2:  [98 %-100 %] 99 % (07/16 0609)  Intake/Output from previous day:  Intake/Output Summary (Last 24 hours) at 11/10/2020 0838 Last data filed at 11/10/2020 0600 Gross per 24 hour  Intake 326.39 ml  Output 500 ml  Net -173.61 ml    Intake/Output this shift: No intake/output data recorded.  Labs: Recent Labs    11/08/20 1803 11/09/20 0513 11/10/20 0438  HGB 5.7* 7.1* 6.8*   Recent Labs    11/09/20 0513 11/10/20 0438  WBC 2.7* 2.9*  RBC 2.28*  2.23* 2.21*  HCT 21.8* 21.6*  PLT 228 196   Recent Labs    11/09/20 0513 11/10/20 0438  NA 135 140  K 4.5 4.0  CL 108 112*  CO2 22 22  BUN 16 18  CREATININE 1.09 1.45*  GLUCOSE 73 109*  CALCIUM 8.1* 7.9*   No results for input(s): LABPT, INR in the last 72 hours.  Exam: Right knee exam: Well healed arthroplasty scar. Mild effusion No tenderness to palpation Calves soft and nontender Plantar flexion and dorsiflexion intact  Past Medical History:  Diagnosis Date   Alcohol abuse    Anemia    Arthritis    Ascites    Cirrhosis ([name])    Coffee ground emesis    Dehydration 06/17/2017   Febrile illness    GERD (gastroesophageal reflux disease)    Heart murmur    History of blood transfusion    Hyperlipidemia    Hypertension    Leg swelling    Myocardial infarction ([place]) 2012   Preop cardiovascular exam 04/14/2013   Sepsis ([place]) 06/17/2017   Septic shock ([place]) 06/18/2017   SIRS (systemic inflammatory response syndrome) ([place]) 07/11/2017   Stroke ([place]) 2012   no deficits   Thrombocytopenia ([name])      Assessment/Plan:    Principal Problem:   Septic joint of right knee joint ([name]) Active Problems:   Hyponatremia   Acute on chronic anemia   CAD (coronary artery disease)   Cirrhosis of liver without ascites ([name])  Estimated body mass index is 19.34 kg/m as calculated from the following:   Height as of this encounter: 5\' 7"  (1.702 m).   Weight as of this encounter: 56 kg.  Continue vanc and zosyn. Patient to be evaluated by Dr. Alvan Dame tomorrow with possible washout.  Theresa Duty, PA-C Orthopedic Surgery [phone] 11/10/2020, 8:38 AM

## 2020-11-10 NOTE — Progress Notes (Addendum)
Pharmacy Antibiotic Note  Mark [name] is a 65 y.o. male admitted on 11/08/2020 with right knee pain s/p TKA in April 2022. Pt is on lifelong doxy, but ran out a few weeks ago. Pharmacy has been consulted for vancomycin dosing for septic knee joint.  11/10/20 10:13 AM  SCr increased to 1.45 Possible increased risk for renal insult w/ concomitant Zosyn   Plan: Decrease vancomycin dosing to 750 mg q 24 hours (AUC 435, SCR 1.45)  Transition from Zosyn to cefepime per MD  Monitor renal fxn, cx results, clinical progression, LOT   Height: 5\' 7"  (170.2 cm) Weight: 56 kg (123 lb 7.3 oz) IBW/kg (Calculated) : 66.1  Temp (24hrs), Avg:98.2 F (36.8 C), Min:98.1 F (36.7 C), Max:98.3 F (36.8 C)  Recent Labs  Lab 11/08/20 1803 11/09/20 0513 11/10/20 0438  WBC 3.3* 2.7* 2.9*  CREATININE 1.18 1.09 1.45*  LATICACIDVEN 1.7  --   --      Estimated Creatinine Clearance: 39.2 mL/min (A) (by C-G formula based on SCr of 1.45 mg/dL (H)).    No Known Allergies  Antimicrobials this admission: 7/15 Vancomycin >>  7/15 Zosyn >> 7/16 7/16 cefepime >>  Microbiology results: 7/14 BCx: ngtd Knee body fluid 7/14: rare GPC in pairs, pending   Thank you for allowing pharmacy to be a part of this patient's care.  Ulice Dash D  11/10/2020 10:11 AM

## 2020-11-10 NOTE — Progress Notes (Signed)
Mark [name]  XQJ:[id number] DOB: 02-21-1956 DOA: 11/08/2020 PCP: [name], MD    Brief Narrative:  [phone] with a history of alcoholic liver cirrhosis and chronic anemia who presented to the ED with increasing pain and swelling of his right knee.  The patient underwent a right total knee arthroplasty in 8563 which was complicated by an infection and required explantation November 2021 with placement of an antibiotic spacer and long-term antibiotic treatment.  In April 2022 he underwent replacement of his total knee.  He was prescribed lifelong doxycycline at the direction of the infectious disease service but ran out approximately 3 weeks prior to this admission.  Orthopedics evaluated the patient in the ED and performed an arthrocentesis.  Arrangements were then made for him to be transferred from [facility] ED to [facility] ED for planned orthopedic procedure.  He was also found to be significantly anemic and therefore was transfused while still in the ED at [facility].  Significant Events:  2015 R knee TKA 02/2020 removal TKA - abx spacer placed 4/22 redo TKA 7/14 admit via [facility] w/ recurrent septic R knee 7/14 arthrocentesis right knee in the ED 7/15 1 U PRBC  Consultants:  Orthopedic surgery  Code Status: NO CODE BLUE  Antimicrobials:  Vancomycin 7/14 > Zosyn 7/14 >  DVT prophylaxis: SCDs  Subjective: Afebrile.  Vital signs stable.  Saturation 100% on room air.  Is worried about the possibility of requiring another dose of knee surgery.  Denies chest pain nausea vomiting abdominal pain.  States pain in knee has greatly improved over the last 24 hours.  Assessment & Plan:  Recurrent septic right knee TKA Patient was noncompliant with prescribed lifelong doxycycline suppression therapy -care per orthopedic surgery with plan for probable washout early next week - cont empiric abx and f/u culture data   Transient SVT Appreciated while in ED in setting of  DH/anemia - asymptomatic  Acute on chronic normocytic anemia -anemia of chronic disease Due to alcoholism as well as smoldering infection and poor nutrition -cannot rule out occult blood loss though there is no evidence of gross bleeding at this time -hemolysis also a possibility - hemoglobin 5.7 on admission - does have a known history of ulcerative duodenitis noted on EGD 2019 -denies any clear GI bleeding -transfused 1 unit PRBC 7/15 -will transfuse additional unit today as hemoglobin has settled out less than 7 after 1 unit  Recent Labs  Lab 11/08/20 1803 11/09/20 0513 11/10/20 0438  HGB 5.7* 7.1* 6.8*     Acute kidney injury (not POA) Creatinine has climbed since admission -keep hydrated -follow trend -avoid potential nephrotoxins  Recent Labs  Lab 11/08/20 1803 11/09/20 0513 11/10/20 0438  CREATININE 1.18 1.09 1.45*     Leukopenia HIV negative -likely due to bone marrow suppression in setting of alcoholism -follow trend  Alcoholism -cirrhosis of the liver Continue usual home medical therapies  CAD Aspirin on hold -asymptomatic  Hyponatremia Due to alcoholism as well as volume depletion    Family Communication: No family present at time of exam Status is: Inpatient  Remains inpatient appropriate because:Inpatient level of care appropriate due to severity of illness  Dispo: The patient is from: Home              Anticipated d/c is to:  unclear              Patient currently is not medically stable to d/c.   Difficult to place patient No  Objective: Blood pressure 125/65, pulse 71, temperature 98.3 F (36.8 C), temperature source Oral, resp. rate 15, height 5\' 7"  (1.702 m), weight 56 kg, SpO2 99 %.  Intake/Output Summary (Last 24 hours) at 11/10/2020 0800 Last data filed at 11/10/2020 0600 Gross per 24 hour  Intake 326.39 ml  Output 500 ml  Net -173.61 ml   Filed Weights   11/09/20 0529  Weight: 56 kg    Examination: General: No acute respiratory  distress Lungs: Clear to auscultation bilaterally without wheezes or crackles Cardiovascular: Regular rate and rhythm without murmur gallop or rub normal S1 and S2 Abdomen: Nontender, nondistended, soft, bowel sounds positive, no rebound, no ascites, no appreciable mass Extremities: Right knee swollen and tender to touch with no appreciable overlying wound, no significant edema bilateral lower extremities  CBC: Recent Labs  Lab 11/08/20 1803 11/09/20 0513 11/10/20 0438  WBC 3.3* 2.7* 2.9*  NEUTROABS 1.7  --   --   HGB 5.7* 7.1* 6.8*  HCT 17.8* 21.8* 21.6*  MCV 97.3 95.6 97.7  PLT 223 228 322   Basic Metabolic Panel: Recent Labs  Lab 11/08/20 1803 11/09/20 0513 11/10/20 0438  NA 130* 135 140  K 4.2 4.5 4.0  CL 102 108 112*  CO2 19* 22 22  GLUCOSE 88 73 109*  BUN 15 16 18   CREATININE 1.18 1.09 1.45*  CALCIUM 8.1* 8.1* 7.9*   GFR: Estimated Creatinine Clearance: 39.2 mL/min (A) (by C-G formula based on SCr of 1.45 mg/dL (H)).  Liver Function Tests: Recent Labs  Lab 11/08/20 1803  AST 23  ALT 9  ALKPHOS 99  BILITOT 0.5  PROT 7.8  ALBUMIN 2.2*     HbA1C: Hgb A1c MFr Bld  Date/Time Value Ref Range Status  11/01/2009 04:55 PM  <5.7 % Final   5.1 (NOTE)                                                                       According to the ADA Clinical Practice Recommendations for 2011, when HbA1c is used as a screening test:   >=6.5%   Diagnostic of Diabetes Mellitus           (if abnormal result  is confirmed)  5.7-6.4%   Increased risk of developing Diabetes Mellitus  References:Diagnosis and Classification of Diabetes Mellitus,Diabetes GURK,2706,23(JSEGB 1):S62-S69 and Standards of Medical Care in         Diabetes - 2011,Diabetes TDVV,6160,73  (Suppl 1):S11-S61.    Recent Results (from the past 240 hour(s))  Blood culture (routine x 2)     Status: None (Preliminary result)   Collection Time: 11/08/20  6:15 PM   Specimen: BLOOD LEFT ARM  Result Value Ref  Range Status   Specimen Description BLOOD LEFT ARM  Final   Special Requests   Final    BOTTLES DRAWN AEROBIC AND ANAEROBIC Blood Culture adequate volume   Culture   Final    NO GROWTH < 12 HOURS Performed at [facility], [address] [place], [place] [address]    Report Status PENDING  Incomplete  Resp Panel by RT-PCR (Flu A&B, Covid) Nasopharyngeal Swab     Status: None   Collection Time: 11/08/20  9:03 PM   Specimen: Nasopharyngeal  Swab; Nasopharyngeal(NP) swabs in vial transport medium  Result Value Ref Range Status   SARS Coronavirus 2 by RT PCR NEGATIVE NEGATIVE Final    Comment: (NOTE) SARS-CoV-2 target nucleic acids are NOT DETECTED.  The SARS-CoV-2 RNA is generally detectable in upper respiratory specimens during the acute phase of infection. The lowest concentration of SARS-CoV-2 viral copies this assay can detect is 138 copies/mL. A negative result does not preclude SARS-Cov-2 infection and should not be used as the sole basis for treatment or other patient management decisions. A negative result may occur with  improper specimen collection/handling, submission of specimen other than nasopharyngeal swab, presence of viral mutation(s) within the areas targeted by this assay, and inadequate number of viral copies(<138 copies/mL). A negative result must be combined with clinical observations, patient history, and epidemiological information. The expected result is Negative.  Fact Sheet for Patients:  EntrepreneurPulse.com.au  Fact Sheet for Healthcare Providers:  IncredibleEmployment.be  This test is no t yet approved or cleared by the [place] FDA and  has been authorized for detection and/or diagnosis of SARS-CoV-2 by FDA under an Emergency Use Authorization (EUA). This EUA will remain  in effect (meaning this test can be used) for the duration of the COVID-19 declaration under Section 564(b)(1) of the Act,  21 U.S.C.section 360bbb-3(b)(1), unless the authorization is terminated  or revoked sooner.       Influenza A by PCR NEGATIVE NEGATIVE Final   Influenza B by PCR NEGATIVE NEGATIVE Final    Comment: (NOTE) The Xpert Xpress SARS-CoV-2/FLU/RSV plus assay is intended as an aid in the diagnosis of influenza from Nasopharyngeal swab specimens and should not be used as a sole basis for treatment. Nasal washings and aspirates are unacceptable for Xpert Xpress SARS-CoV-2/FLU/RSV testing.  Fact Sheet for Patients: EntrepreneurPulse.com.au  Fact Sheet for Healthcare Providers: IncredibleEmployment.be  This test is not yet approved or cleared by the [place] FDA and has been authorized for detection and/or diagnosis of SARS-CoV-2 by FDA under an Emergency Use Authorization (EUA). This EUA will remain in effect (meaning this test can be used) for the duration of the COVID-19 declaration under Section 564(b)(1) of the Act, 21 U.S.C. section 360bbb-3(b)(1), unless the authorization is terminated or revoked.  Performed at [facility], [place] [address], [place], [place] [address]   Body fluid culture w Gram Stain     Status: None (Preliminary result)   Collection Time: 11/08/20 10:09 PM   Specimen: KNEE; Body Fluid  Result Value Ref Range Status   Specimen Description KNEE  Final   Special Requests NONE  Final   Gram Stain   Final    ABUNDANT WBC PRESENT, PREDOMINANTLY PMN RARE GRAM POSITIVE COCCI IN PAIRS Performed at [facility], [place] [address], [place], [place] [address]    Culture PENDING  Incomplete   Report Status PENDING  Incomplete  Blood culture (routine x 2)     Status: None (Preliminary result)   Collection Time: 11/08/20 10:23 PM   Specimen: BLOOD RIGHT FOREARM  Result Value Ref Range Status   Specimen Description BLOOD RIGHT FOREARM  Final   Special Requests   Final    BOTTLES DRAWN AEROBIC AND ANAEROBIC Blood Culture  adequate volume   Culture   Final    NO GROWTH < 12 HOURS Performed at [facility], [place] [address], [place],  [address]    Report Status PENDING  Incomplete     Scheduled Meds:  sodium chloride  Intravenous Once   pantoprazole  40 mg Oral Daily   Continuous Infusions:  piperacillin-tazobactam (ZOSYN)  IV 3.375 g (11/10/20 0253)   vancomycin       LOS: 2 days   Cherene Altes, MD [facility] Office  [phone] Pager - Text Page per Amion  If 7PM-7AM, please contact night-coverage per Amion 11/10/2020, 8:00 AM

## 2020-11-11 LAB — BPAM RBC
Blood Product Expiration Date: 202208092359
ISSUE DATE / TIME: 202207161640
Unit Type and Rh: 7300

## 2020-11-11 LAB — CBC
HCT: 25.6 % — ABNORMAL LOW (ref 39.0–52.0)
Hemoglobin: 8.4 g/dL — ABNORMAL LOW (ref 13.0–17.0)
MCH: 31 pg (ref 26.0–34.0)
MCHC: 32.8 g/dL (ref 30.0–36.0)
MCV: 94.5 fL (ref 80.0–100.0)
Platelets: 183 10*3/uL (ref 150–400)
RBC: 2.71 MIL/uL — ABNORMAL LOW (ref 4.22–5.81)
RDW: 16.7 % — ABNORMAL HIGH (ref 11.5–15.5)
WBC: 3.3 10*3/uL — ABNORMAL LOW (ref 4.0–10.5)
nRBC: 0 % (ref 0.0–0.2)

## 2020-11-11 LAB — TYPE AND SCREEN
ABO/RH(D): B POS
Antibody Screen: NEGATIVE
Unit division: 0

## 2020-11-11 LAB — MAGNESIUM: Magnesium: 1.9 mg/dL (ref 1.7–2.4)

## 2020-11-11 LAB — BASIC METABOLIC PANEL
Anion gap: 7 (ref 5–15)
BUN: 18 mg/dL (ref 8–23)
CO2: 21 mmol/L — ABNORMAL LOW (ref 22–32)
Calcium: 8.1 mg/dL — ABNORMAL LOW (ref 8.9–10.3)
Chloride: 111 mmol/L (ref 98–111)
Creatinine, Ser: 1.18 mg/dL (ref 0.61–1.24)
GFR, Estimated: >60 mL/min (ref >60)
Glucose, Bld: 73 mg/dL (ref 70–99)
Potassium: 3.8 mmol/L (ref 3.5–5.1)
Sodium: 139 mmol/L (ref 135–145)

## 2020-11-11 LAB — HEPATIC FUNCTION PANEL
ALT: 9 U/L (ref 0–44)
AST: 20 U/L (ref 15–41)
Albumin: 2.2 g/dL — ABNORMAL LOW (ref 3.5–5.0)
Alkaline Phosphatase: 77 U/L (ref 38–126)
Bilirubin, Direct: 0.2 mg/dL (ref 0.0–0.2)
Indirect Bilirubin: 0.7 mg/dL (ref 0.3–0.9)
Total Bilirubin: 0.9 mg/dL (ref 0.3–1.2)
Total Protein: 7.1 g/dL (ref 6.5–8.1)

## 2020-11-11 LAB — PHOSPHORUS: Phosphorus: 2.9 mg/dL (ref 2.5–4.6)

## 2020-11-11 LAB — PROTEIN, BODY FLUID (OTHER): Total Protein, Body Fluid Other: 5.7 g/dL

## 2020-11-11 LAB — GLUCOSE, BODY FLUID OTHER: Glucose, Body Fluid Other: <2 mg/dL

## 2020-11-11 MED ORDER — NADOLOL 20 MG PO TABS
10.0000 mg | ORAL_TABLET | Freq: Every day | ORAL | Status: DC
  Administered 2020-11-12 – 2020-11-15 (×3): 10 mg via ORAL
  Filled 2020-11-11 (×4): qty 1

## 2020-11-11 MED ORDER — VANCOMYCIN HCL 1000 MG/200ML IV SOLN
1000.0000 mg | INTRAVENOUS | Status: DC
  Administered 2020-11-12: 1000 mg via INTRAVENOUS
  Filled 2020-11-11: qty 200

## 2020-11-11 MED ORDER — THIAMINE HCL 100 MG PO TABS
100.0000 mg | ORAL_TABLET | Freq: Every day | ORAL | Status: DC
  Administered 2020-11-11 – 2020-11-15 (×4): 100 mg via ORAL
  Filled 2020-11-11 (×4): qty 1

## 2020-11-11 MED ORDER — ADULT MULTIVITAMIN W/MINERALS CH
1.0000 | ORAL_TABLET | Freq: Every day | ORAL | Status: DC
  Administered 2020-11-11 – 2020-11-15 (×4): 1 via ORAL
  Filled 2020-11-11 (×4): qty 1

## 2020-11-11 MED ORDER — ENSURE ENLIVE PO LIQD
237.0000 mL | Freq: Three times a day (TID) | ORAL | Status: DC
  Administered 2020-11-11 – 2020-11-15 (×8): 237 mL via ORAL

## 2020-11-11 NOTE — Progress Notes (Signed)
Subjective:    Patient is resting in bed on exam this morning. He is well known to our team. He reports the swelling has gone down in his knee. He is very hesitant about repeat surgical intervention, and is hoping to treat this with antibiotics alone.  Objective: Vital signs in last 24 hours: Temp:  [98.3 F (36.8 C)-98.6 F (37 C)] 98.3 F (36.8 C) (07/17 0601) Pulse Rate:  [62-64] 62 (07/17 0601) Resp:  [13-19] 18 (07/17 0601) BP: (116-135)/(63-68) 116/67 (07/17 0601) SpO2:  [98 %-100 %] 98 % (07/17 0601)  Intake/Output from previous day:  Intake/Output Summary (Last 24 hours) at 11/11/2020 1004 Last data filed at 11/11/2020 0603 Gross per 24 hour  Intake 1021.67 ml  Output 750 ml  Net 271.67 ml     Intake/Output this shift: No intake/output data recorded.  Labs: Recent Labs    11/08/20 1803 11/09/20 0513 11/10/20 0438 11/11/20 0534  HGB 5.7* 7.1* 6.8* 8.4*   Recent Labs    11/10/20 0438 11/11/20 0534  WBC 2.9* 3.3*  RBC 2.21* 2.71*  HCT 21.6* 25.6*  PLT 196 183   Recent Labs    11/10/20 0438 11/11/20 0534  NA 140 139  K 4.0 3.8  CL 112* 111  CO2 22 21*  BUN 18 18  CREATININE 1.45* 1.18  GLUCOSE 109* 73  CALCIUM 7.9* 8.1*   No results for input(s): LABPT, INR in the last 72 hours.  Exam: General - Patient is Alert and Oriented Extremity - Neurologically intact Sensation intact distally Intact pulses distally Dorsiflexion/Plantar flexion intact Dressing - dressing C/D/I Motor Function - intact, moving foot and toes well on exam.   Past Medical History:  Diagnosis Date   Alcohol abuse    Anemia    Arthritis    Ascites    Cirrhosis ([name])    Coffee ground emesis    Dehydration 06/17/2017   Febrile illness    GERD (gastroesophageal reflux disease)    Heart murmur    History of blood transfusion    Hyperlipidemia    Hypertension    Leg swelling    Myocardial infarction ([place]) 2012   Preop cardiovascular exam 04/14/2013   Sepsis  ([place]) 06/17/2017   Septic shock ([place]) 06/18/2017   SIRS (systemic inflammatory response syndrome) ([place]) 07/11/2017   Stroke ([place]) 2012   no deficits   Thrombocytopenia ([name])     Assessment/Plan:    Principal Problem:   Septic joint of right knee joint ([place]) Active Problems:   Hyponatremia   Acute on chronic anemia   CAD (coronary artery disease)   Cirrhosis of liver without ascites ([name])  Estimated body mass index is 19.34 kg/m as calculated from the following:   Height as of this encounter: 5\' 7"  (1.702 m).   Weight as of this encounter: 56 kg.   Assessment: Recurrent prosthetic joint infection, right knee  Chronic anemia, improved to 8.4 this AM after transfusion  Plan: Mark [name] is well known to our practice, and unfortunately has recurrence of infection after 2 stage revision of the right knee within the past year. Culture reveals staph aureus. I discussed this diagnosis with him, and recommendation for treatment with irrigation and debridement with polyethylene exchange followed by a course of IV antibiotics. He is very hesitant to consider surgery, and wishes to try treatment with antibiotics alone. We discussed the risk of requiring repeat 2 stage revision should we fail to resolve this infection acutely.   Dr. Alvan Dame will be  by to see the patient in the AM to discuss this in more detail. If surgery is decided on, it will not be until Tuesday so may remain with normal diet and pain control.    Mark Citron, PA-C Orthopedic Surgery [phone] 11/11/2020, 10:04 AM

## 2020-11-11 NOTE — Progress Notes (Signed)
Initial Nutrition Assessment  DOCUMENTATION CODES:  Not applicable  INTERVENTION:  Continue current diet as ordered, encourage PO intake Ensure Enlive po TID, each supplement provides 350 kcal and 20 grams of protein Magic cup TID with meals, each supplement provides 290 kcal and 9 grams of protein MVI with minerals daily, continue folic acid, add thiamine 100mg  daily for hx of EtOH abuse.  NUTRITION DIAGNOSIS:  Inadequate oral intake related to poor appetite (and altered taste perception) as evidenced by per patient/family report, meal completion < 50%.  GOAL:  Patient will meet greater than or equal to 90% of their needs  MONITOR:  PO intake, Supplement acceptance, Labs, Skin  REASON FOR ASSESSMENT:  Malnutrition Screening Tool    ASSESSMENT:  65 y.o. male sent to ED from PCP over concerns for possible septic joint. Pt complains of right knee pain and swelling which started 6 days ago. Pt underwent total knee replacement in November 2021 and has had recurrent bouts of infections in his knee and spine. Medical history significant for alcoholic liver cirrhosis, anemia, GERD, HLD, HTN, and Hx MI and CVA.  Aspirate was taken from the right knee which revealed staph aureus. Orthopedics recommending I&D this week. However, pt hesitant to undergo more surgery.  Called pt on room phone to discuss nutrition hx. Pt reports that appetite has not been good since admission, but this is not a new issue. States that since he lost his teeth, he has had altered taste perception and food is not as appealing to him. Requests ensure (prefers vanilla flavor). Offered a modified texture diet to ensure soft foods are sent, declines at this time. Pt is also agreeable to receiving magic cup.  Endorses weight loss over several months and that usual is about 145 lbs. 4.3% weight loss noted over the last 6 months (1/6-7/17) which is not significant. BMI is less than desirable for >13 years of age.  Average  Meal Intake: 7/14-7/17: 35% intake x 2 recorded meals  Nutritionally Relevant Medications: Scheduled Meds:  docusate sodium  100 mg Oral BID   folic acid  1 mg Oral Daily   pantoprazole  40 mg Oral Daily   Continuous Infusions:  ceFEPime (MAXIPIME) IV 2 g (11/11/20 1040)   vancomycin     PRN Meds: ondansetron   Labs Reviewed  NUTRITION - FOCUSED PHYSICAL EXAM: Defer to in-person assessment  Diet Order:   Diet Order             Diet regular Room service appropriate? Yes; Fluid consistency: Thin  Diet effective now                   EDUCATION NEEDS:  No education needs have been identified at this time  Skin:  Skin Assessment: Reviewed RN Assessment  Last BM:  PTA  Height:  Ht Readings from Last 1 Encounters:  11/09/20 5\' 7"  (1.702 m)    Weight:  Wt Readings from Last 1 Encounters:  11/09/20 56 kg    Ideal Body Weight:  67.3 kg  BMI:  Body mass index is 19.34 kg/m.  Estimated Nutritional Needs:  Kcal:  1700-1900 kcal/d Protein:  85-100 g/d Fluid:  >1.8L/d  [name], RD, LDN Clinical Dietitian Pager on [place]

## 2020-11-11 NOTE — Plan of Care (Signed)
  Problem: Education: Goal: Knowledge of General Education information will improve Description Including pain rating scale, medication(s)/side effects and non-pharmacologic comfort measures Outcome: Progressing   Problem: Activity: Goal: Risk for activity intolerance will decrease Outcome: Progressing   Problem: Safety: Goal: Ability to remain free from injury will improve Outcome: Progressing   

## 2020-11-11 NOTE — Progress Notes (Signed)
Mark [name]  XBJ:[id number] DOB: 1956/01/01 DOA: 11/08/2020 PCP: [name], MD    Brief Narrative:  [phone] with a history of alcoholic liver cirrhosis and chronic anemia who presented to the ED with increasing pain and swelling of his right knee.  The patient underwent a right total knee arthroplasty in 5784 which was complicated by an infection and required explantation November 2021 with placement of an antibiotic spacer and long-term antibiotic treatment.  In April 2022 he underwent replacement of his total knee.  He was prescribed lifelong doxycycline at the direction of the Infectious Disease service but ran out approximately 3 weeks prior to this admission.  Orthopedics evaluated the patient in the ED and performed an arthrocentesis.  Arrangements were then made for him to be transferred from [facility] ED to [facility] ED for planned orthopedic procedure.  He was also found to be significantly anemic and therefore was transfused while still in the ED at [facility].  Significant Events:  2015 R knee TKA 02/2020 removal TKA - abx spacer placed 4/22 redo TKA 7/14 admit via [facility] w/ recurrent septic R knee 7/14 arthrocentesis right knee in the ED 7/14 1 U PRBC 7/15 1 U PRBC  Consultants:  Orthopedic surgery  Code Status: NO CODE BLUE  Antimicrobials:  Vancomycin 7/15 > Zosyn 7/15 Cefepime 7/16 >  DVT prophylaxis: SCDs  Subjective: Is very hesitant to consider another knee surgery.  I spoke to him at length concerning the difficult nature of septic arthritis.  I advised him that orthopedic surgery would see him and allow him discuss this further.  He otherwise has no specific complaints.  He denies shortness of breath or chest pain.  Assessment & Plan:  Recurrent septic right knee TKA Patient was noncompliant with prescribed lifelong doxycycline suppression therapy - care per Orthopedic Surgery with plan for probable washout early next week - cont empiric abx  and f/u culture data   Transient SVT Appreciated while in ED in setting of DH/anemia - asymptomatic  Acute on chronic normocytic anemia - anemia of chronic disease Due to alcoholism as well as smoldering infection and poor nutrition - cannot rule out occult blood loss though there is no evidence of gross bleeding at this time - hemoglobin 5.7 on admission - does have a known history of ulcerative duodenitis noted on EGD 2019 - denies any clear GI bleeding -transfused a total of 2 units PRBC thus far - cont to follow trend   Recent Labs  Lab 11/08/20 1803 11/09/20 0513 11/10/20 0438 11/11/20 0534  HGB 5.7* 7.1* 6.8* 8.4*     Acute kidney injury (not POA) Creatinine climbed somewhat following admission -now improving again with volume expansion and transfusion -follow trend  Recent Labs  Lab 11/08/20 1803 11/09/20 0513 11/10/20 0438 11/11/20 0534  CREATININE 1.18 1.09 1.45* 1.18     Leukopenia HIV negative -likely due to bone marrow suppression in setting of alcoholism -follow trend  Alcoholism -cirrhosis of the liver Continue usual home medical therapies - no evidence of alcohol withdrawal thus far  CAD Aspirin on hold -asymptomatic  Hyponatremia Due to alcoholism as well as volume depletion -resolved with volume expansion/transfusion    Family Communication: No family present at time of exam Status is: Inpatient  Remains inpatient appropriate because:Inpatient level of care appropriate due to severity of illness  Dispo: The patient is from: Home              Anticipated d/c is to:  unclear              Patient currently is not medically stable to d/c.   Difficult to place patient No  Objective: Blood pressure 116/67, pulse 62, temperature 98.3 F (36.8 C), temperature source Oral, resp. rate 18, height 5\' 7"  (1.702 m), weight 56 kg, SpO2 98 %.  Intake/Output Summary (Last 24 hours) at 11/11/2020 0918 Last data filed at 11/11/2020 0603 Gross per 24 hour   Intake 1021.67 ml  Output 750 ml  Net 271.67 ml    Filed Weights   11/09/20 0529  Weight: 56 kg    Examination: General: No acute respiratory distress Lungs: CTA B - no wheezing  Cardiovascular: RRR - no M  Abdomen: NT/ND, soft, bs+, no mass, no rebound  Extremities: R knee swollen and tender to touch with no appreciable overlying wound - no signif edema B LE   CBC: Recent Labs  Lab 11/08/20 1803 11/09/20 0513 11/10/20 0438 11/11/20 0534  WBC 3.3* 2.7* 2.9* 3.3*  NEUTROABS 1.7  --   --   --   HGB 5.7* 7.1* 6.8* 8.4*  HCT 17.8* 21.8* 21.6* 25.6*  MCV 97.3 95.6 97.7 94.5  PLT 223 228 196 010    Basic Metabolic Panel: Recent Labs  Lab 11/09/20 0513 11/10/20 0438 11/11/20 0534  NA 135 140 139  K 4.5 4.0 3.8  CL 108 112* 111  CO2 22 22 21*  GLUCOSE 73 109* 73  BUN 16 18 18   CREATININE 1.09 1.45* 1.18  CALCIUM 8.1* 7.9* 8.1*  MG  --   --  1.9  PHOS  --   --  2.9    GFR: Estimated Creatinine Clearance: 48.1 mL/min (by C-G formula based on SCr of 1.18 mg/dL).  Liver Function Tests: Recent Labs  Lab 11/08/20 1803 11/11/20 0534  AST 23 20  ALT 9 9  ALKPHOS 99 77  BILITOT 0.5 0.9  PROT 7.8 7.1  ALBUMIN 2.2* 2.2*      HbA1C: Hgb A1c MFr Bld  Date/Time Value Ref Range Status  11/01/2009 04:55 PM  <5.7 % Final   5.1 (NOTE)                                                                       According to the ADA Clinical Practice Recommendations for 2011, when HbA1c is used as a screening test:   >=6.5%   Diagnostic of Diabetes Mellitus           (if abnormal result  is confirmed)  5.7-6.4%   Increased risk of developing Diabetes Mellitus  References:Diagnosis and Classification of Diabetes Mellitus,Diabetes XNAT,5573,22(GURKY 1):S62-S69 and Standards of Medical Care in         Diabetes - 2011,Diabetes Care,2011,34  (Suppl 1):S11-S61.    Recent Results (from the past 240 hour(s))  Blood culture (routine x 2)     Status: None (Preliminary result)    Collection Time: 11/08/20  6:15 PM   Specimen: BLOOD LEFT ARM  Result Value Ref Range Status   Specimen Description BLOOD LEFT ARM  Final   Special Requests   Final    BOTTLES DRAWN AEROBIC AND ANAEROBIC Blood Culture adequate volume   Culture   Final  NO GROWTH 2 DAYS Performed at [facility], [place] [address], [place], [place] [address]    Report Status PENDING  Incomplete  Resp Panel by RT-PCR (Flu A&B, Covid) Nasopharyngeal Swab     Status: None   Collection Time: 11/08/20  9:03 PM   Specimen: Nasopharyngeal Swab; Nasopharyngeal(NP) swabs in vial transport medium  Result Value Ref Range Status   SARS Coronavirus 2 by RT PCR NEGATIVE NEGATIVE Final    Comment: (NOTE) SARS-CoV-2 target nucleic acids are NOT DETECTED.  The SARS-CoV-2 RNA is generally detectable in upper respiratory specimens during the acute phase of infection. The lowest concentration of SARS-CoV-2 viral copies this assay can detect is 138 copies/mL. A negative result does not preclude SARS-Cov-2 infection and should not be used as the sole basis for treatment or other patient management decisions. A negative result may occur with  improper specimen collection/handling, submission of specimen other than nasopharyngeal swab, presence of viral mutation(s) within the areas targeted by this assay, and inadequate number of viral copies(<138 copies/mL). A negative result must be combined with clinical observations, patient history, and epidemiological information. The expected result is Negative.  Fact Sheet for Patients:  EntrepreneurPulse.com.au  Fact Sheet for Healthcare Providers:  IncredibleEmployment.be  This test is no t yet approved or cleared by the [place] FDA and  has been authorized for detection and/or diagnosis of SARS-CoV-2 by FDA under an Emergency Use Authorization (EUA). This EUA will remain  in effect (meaning this test can be used) for the  duration of the COVID-19 declaration under Section 564(b)(1) of the Act, 21 U.S.C.section 360bbb-3(b)(1), unless the authorization is terminated  or revoked sooner.       Influenza A by PCR NEGATIVE NEGATIVE Final   Influenza B by PCR NEGATIVE NEGATIVE Final    Comment: (NOTE) The Xpert Xpress SARS-CoV-2/FLU/RSV plus assay is intended as an aid in the diagnosis of influenza from Nasopharyngeal swab specimens and should not be used as a sole basis for treatment. Nasal washings and aspirates are unacceptable for Xpert Xpress SARS-CoV-2/FLU/RSV testing.  Fact Sheet for Patients: EntrepreneurPulse.com.au  Fact Sheet for Healthcare Providers: IncredibleEmployment.be  This test is not yet approved or cleared by the [place] FDA and has been authorized for detection and/or diagnosis of SARS-CoV-2 by FDA under an Emergency Use Authorization (EUA). This EUA will remain in effect (meaning this test can be used) for the duration of the COVID-19 declaration under Section 564(b)(1) of the Act, 21 U.S.C. section 360bbb-3(b)(1), unless the authorization is terminated or revoked.  Performed at [facility], [place] [address], [place], [place] [address]   Body fluid culture w Gram Stain     Status: None (Preliminary result)   Collection Time: 11/08/20 10:09 PM   Specimen: KNEE; Body Fluid  Result Value Ref Range Status   Specimen Description KNEE  Final   Special Requests NONE  Final   Gram Stain   Final    ABUNDANT WBC PRESENT, PREDOMINANTLY PMN RARE GRAM POSITIVE COCCI IN PAIRS    Culture   Final    RARE STAPHYLOCOCCUS AUREUS SUSCEPTIBILITIES TO FOLLOW Performed at [facility], [place] [address], [place],  [address]    Report Status PENDING  Incomplete  Blood culture (routine x 2)     Status: None (Preliminary result)   Collection Time: 11/08/20 10:23 PM   Specimen: BLOOD RIGHT FOREARM  Result Value Ref Range Status    Specimen Description BLOOD RIGHT FOREARM  Final  Special Requests   Final    BOTTLES DRAWN AEROBIC AND ANAEROBIC Blood Culture adequate volume   Culture   Final    NO GROWTH 2 DAYS Performed at [facility], [place] [address], [place], [place] [address]    Report Status PENDING  Incomplete     Scheduled Meds:  sodium chloride   Intravenous Once   docusate sodium  100 mg Oral BID   folic acid  1 mg Oral Daily   nadolol  20 mg Oral Daily   pantoprazole  40 mg Oral Daily   Continuous Infusions:  ceFEPime (MAXIPIME) IV 2 g (11/10/20 2121)   vancomycin       LOS: 3 days   Cherene Altes, MD [facility] Office  [phone] Pager - Text Page per Shea Evans  If 7PM-7AM, please contact night-coverage per Amion 11/11/2020, 9:18 AM

## 2020-11-11 NOTE — Progress Notes (Signed)
Pharmacy Antibiotic Note  Mark [name] is a 65 y.o. male admitted on 11/08/2020 with right knee pain s/p TKA in April 2022. Pt is on lifelong doxy, but ran out a few weeks ago. Pharmacy has been consulted for vancomycin dosing for septic knee joint.  11/11/20 12:05 PM  SCr back down to 1.18 Transitioned from Zosyn to cefepime yesterday Joint culture growing Staph aureus afebrile   Plan: Increase vancomycin dosing to 1000 mg q 24 hours (AUC 481, SCR 1.18)  Cefepime per MD  Monitor renal fxn, cx results, clinical progression, LOT   Height: 5\' 7"  (170.2 cm) Weight: 56 kg (123 lb 7.3 oz) IBW/kg (Calculated) : 66.1  Temp (24hrs), Avg:98.4 F (36.9 C), Min:98.3 F (36.8 C), Max:98.6 F (37 C)  Recent Labs  Lab 11/08/20 1803 11/09/20 0513 11/10/20 0438 11/11/20 0534  WBC 3.3* 2.7* 2.9* 3.3*  CREATININE 1.18 1.09 1.45* 1.18  LATICACIDVEN 1.7  --   --   --      Estimated Creatinine Clearance: 48.1 mL/min (by C-G formula based on SCr of 1.18 mg/dL).    No Known Allergies  Antimicrobials this admission: 7/15 Vancomycin >>  7/15 Zosyn >> 7/16 7/16 cefepime >>  Microbiology results: 7/14 BCx: ngtd Knee body fluid 7/14: rare Staph aureus  Thank you for allowing pharmacy to be a part of this patient's care.  Ulice Dash D  11/11/2020 12:05 PM

## 2020-11-12 ENCOUNTER — Inpatient Hospital Stay: Payer: Self-pay

## 2020-11-12 LAB — BASIC METABOLIC PANEL
Anion gap: 7 (ref 5–15)
BUN: 17 mg/dL (ref 8–23)
CO2: 21 mmol/L — ABNORMAL LOW (ref 22–32)
Calcium: 8.4 mg/dL — ABNORMAL LOW (ref 8.9–10.3)
Chloride: 114 mmol/L — ABNORMAL HIGH (ref 98–111)
Creatinine, Ser: 1.18 mg/dL (ref 0.61–1.24)
GFR, Estimated: >60 mL/min (ref >60)
Glucose, Bld: 82 mg/dL (ref 70–99)
Potassium: 4 mmol/L (ref 3.5–5.1)
Sodium: 142 mmol/L (ref 135–145)

## 2020-11-12 LAB — CBC
HCT: 28.1 % — ABNORMAL LOW (ref 39.0–52.0)
Hemoglobin: 9.1 g/dL — ABNORMAL LOW (ref 13.0–17.0)
MCH: 31.2 pg (ref 26.0–34.0)
MCHC: 32.4 g/dL (ref 30.0–36.0)
MCV: 96.2 fL (ref 80.0–100.0)
Platelets: 199 10*3/uL (ref 150–400)
RBC: 2.92 MIL/uL — ABNORMAL LOW (ref 4.22–5.81)
RDW: 16.3 % — ABNORMAL HIGH (ref 11.5–15.5)
WBC: 5.6 10*3/uL (ref 4.0–10.5)
nRBC: 0 % (ref 0.0–0.2)

## 2020-11-12 NOTE — Plan of Care (Signed)
  Problem: Education: Goal: Knowledge of General Education information will improve Description: Including pain rating scale, medication(s)/side effects and non-pharmacologic comfort measures Outcome: Progressing   Problem: Activity: Goal: Risk for activity intolerance will decrease Outcome: Progressing   

## 2020-11-12 NOTE — Care Management Important Message (Signed)
Important Message  Patient Details IM Letter given to the Patient. Name: Mark [name] MRN: [id number] Date of Birth: 01-27-1956   Medicare Important Message Given:  Yes     Kerin Salen 11/12/2020, 12:12 PM

## 2020-11-12 NOTE — Progress Notes (Signed)
Patient ID: Mark [name], male   DOB: [birth year], 65 y.o.   MRN: [id number]  Agitated in the sense that he wants to get out of the hospital.  Tired of sitting around. This was his primary concern. No significant complaints of pain.  Right knee: Incision healed without drainage Warmth No significant effusion  Specimen Description KNEE   Special Requests NONE   Gram Stain ABUNDANT WBC PRESENT, PREDOMINANTLY PMN  RARE GRAM POSITIVE COCCI IN PAIRS  Performed at [facility], [place] [address], [name], [place] [address]   Culture RARE METHICILLIN RESISTANT STAPHYLOCOCCUS AUREUS   Report Status 11/11/2020 FINAL   Organism ID, Bacteria METHICILLIN RESISTANT STAPHYLOCOCCUS AUREUS   Resulting Agency [facility]      Susceptibility   Methicillin resistant staphylococcus aureus    MIC    CIPROFLOXACIN <=0.5 SENSI... Sensitive    CLINDAMYCIN <=0.25 SENS... Sensitive    ERYTHROMYCIN >=8 RESISTANT  Resistant    GENTAMICIN <=0.5 SENSI... Sensitive    Inducible Clindamycin NEGATIVE  Sensitive    OXACILLIN >=4 RESISTANT  Resistant    RIFAMPIN <=0.5 SENSI... Sensitive    TETRACYCLINE <=1 SENSITIVE  Sensitive    TRIMETH/SULFA <=10 SENSIT... Sensitive    VANCOMYCIN 1 SENSITIVE  Sensitive            Susceptibility Comments  Methicillin resistant staphylococcus aureus  RARE METHICILLIN RESISTANT STAPHYLOCOCCUS AUREUS          Assessment: 1. recurrent right total knee revision infection  Plan: I reviewed with him the significance of this challenging problem.  Not only does he have a recurrent infection it is infected with MRSA and this is complicated by having in place revision components.  I stressed the importance of trying to salvage his joint to prevent morbidity of removing well fixed revision components vs amputation.  He agrees to surgery tomorrow We will order PIC line to be placed today to expedite discharge once procedure performed

## 2020-11-12 NOTE — Progress Notes (Signed)
Mark [name]  QMV:[id number] DOB: 1956/04/27 DOA: 11/08/2020 PCP: [name], MD    Brief Narrative:  [phone] with a history of alcoholic liver cirrhosis and chronic anemia who presented to the ED with increasing pain and swelling of his right knee.  The patient underwent a right total knee arthroplasty in 3244 which was complicated by an infection and required explantation November 2021 with placement of an antibiotic spacer and long-term antibiotic treatment.  In April 2022 he underwent replacement of his total knee.  He was prescribed lifelong doxycycline at the direction of the Infectious Disease service but ran out approximately 3 weeks prior to this admission.  Orthopedics evaluated the patient in the ED and performed an arthrocentesis.  Arrangements were then made for him to be transferred from [facility] to [facility] ED for planned orthopedic procedure.  He was also found to be significantly anemic and therefore was transfused while still in the ED at [facility].  Significant Events:  2015 R knee TKA 02/2020 removal TKA - abx spacer placed 4/22 redo TKA 7/14 admit via [facility] w/ recurrent septic R knee 7/14 arthrocentesis right knee in the ED 7/14 1 U PRBC 7/15 1 U PRBC  Consultants:  Orthopedic surgery  Code Status: NO CODE BLUE  Antimicrobials:  Vancomycin 7/15 > Zosyn 7/15 Cefepime 7/16 >  DVT prophylaxis: SCDs  Subjective: No new questions.  Reports stable pain in right knee.  Continues to have many questions about whether operative procedure is necessary on right knee, despite being educated on this every day since admission.  Denies chest pain or shortness of breath.  Assessment & Plan:  Recurrent MRSA septic right knee TKA Patient was noncompliant with prescribed lifelong doxycycline suppression therapy - care per Orthopedic Surgery with plan for probable washout this week if patient consents -narrow antibiotics to vancomycin alone  Transient  SVT Appreciated while in ED in setting of DH/anemia - asymptomatic  Acute on chronic normocytic anemia - anemia of chronic disease Due to alcoholism as well as smoldering infection and poor nutrition - no evidence of gross bleeding - hemoglobin 5.7 on admission - does have a known history of ulcerative duodenitis noted on EGD 2019 - denies any clear GI bleeding - transfused a total of 2 units PRBC thus far - Hgb improving   Recent Labs  Lab 11/08/20 1803 11/09/20 0513 11/10/20 0438 11/11/20 0534 11/12/20 0405  HGB 5.7* 7.1* 6.8* 8.4* 9.1*     Acute kidney injury (not POA) Creatinine climbed somewhat following admission but improved again with volume expansion and transfusion - follow trend  Recent Labs  Lab 11/08/20 1803 11/09/20 0513 11/10/20 0438 11/11/20 0534 11/12/20 0405  CREATININE 1.18 1.09 1.45* 1.18 1.18     Leukopenia HIV negative -likely due to bone marrow suppression in setting of alcoholism - WBC has normalized   Alcoholism -cirrhosis of the liver Continue usual home medical therapies - no evidence of alcohol withdrawal thus far  CAD Aspirin on hold -asymptomatic  Hyponatremia Due to alcoholism as well as volume depletion -resolved with volume expansion/transfusion    Family Communication: Spoke with patient and wife at bedside Status is: Inpatient  Remains inpatient appropriate because:Inpatient level of care appropriate due to severity of illness  Dispo: The patient is from: Home              Anticipated d/c is to:  unclear              Patient  currently is not medically stable to d/c.   Difficult to place patient No  Objective: Blood pressure 118/66, pulse (!) 58, temperature 98.2 F (36.8 C), temperature source Oral, resp. rate 18, height 5\' 7"  (1.702 m), weight 56 kg, SpO2 100 %.  Intake/Output Summary (Last 24 hours) at 11/12/2020 0917 Last data filed at 11/11/2020 1300 Gross per 24 hour  Intake 250 ml  Output --  Net 250 ml    Filed  Weights   11/09/20 0529  Weight: 56 kg    Examination: General: No acute respiratory distress Lungs: CTA B without wheezing Cardiovascular: RRR - no M  Abdomen: NT/ND, soft, bs+, no mass, no rebound  Extremities: R knee swollen warm and tender to touch with no appreciable overlying wound - no signif edema B LE   CBC: Recent Labs  Lab 11/08/20 1803 11/09/20 0513 11/10/20 0438 11/11/20 0534 11/12/20 0405  WBC 3.3*   < > 2.9* 3.3* 5.6  NEUTROABS 1.7  --   --   --   --   HGB 5.7*   < > 6.8* 8.4* 9.1*  HCT 17.8*   < > 21.6* 25.6* 28.1*  MCV 97.3   < > 97.7 94.5 96.2  PLT 223   < > 196 183 199   < > = values in this interval not displayed.    Basic Metabolic Panel: Recent Labs  Lab 11/10/20 0438 11/11/20 0534 11/12/20 0405  NA 140 139 142  K 4.0 3.8 4.0  CL 112* 111 114*  CO2 22 21* 21*  GLUCOSE 109* 73 82  BUN 18 18 17   CREATININE 1.45* 1.18 1.18  CALCIUM 7.9* 8.1* 8.4*  MG  --  1.9  --   PHOS  --  2.9  --     GFR: Estimated Creatinine Clearance: 48.1 mL/min (by C-G formula based on SCr of 1.18 mg/dL).  Liver Function Tests: Recent Labs  Lab 11/08/20 1803 11/11/20 0534  AST 23 20  ALT 9 9  ALKPHOS 99 77  BILITOT 0.5 0.9  PROT 7.8 7.1  ALBUMIN 2.2* 2.2*      HbA1C: Hgb A1c MFr Bld  Date/Time Value Ref Range Status  11/01/2009 04:55 PM  <5.7 % Final   5.1 (NOTE)                                                                       According to the ADA Clinical Practice Recommendations for 2011, when HbA1c is used as a screening test:   >=6.5%   Diagnostic of Diabetes Mellitus           (if abnormal result  is confirmed)  5.7-6.4%   Increased risk of developing Diabetes Mellitus  References:Diagnosis and Classification of Diabetes Mellitus,Diabetes IRWE,3154,00(QQPYP 1):S62-S69 and Standards of Medical Care in         Diabetes - 2011,Diabetes Care,2011,34  (Suppl 1):S11-S61.    Recent Results (from the past 240 hour(s))  Blood culture (routine x 2)      Status: None (Preliminary result)   Collection Time: 11/08/20  6:15 PM   Specimen: BLOOD LEFT ARM  Result Value Ref Range Status   Specimen Description BLOOD LEFT ARM  Final   Special Requests   Final  BOTTLES DRAWN AEROBIC AND ANAEROBIC Blood Culture adequate volume   Culture   Final    NO GROWTH 4 DAYS Performed at [facility], [place] [address] [place], [place] [address]    Report Status PENDING  Incomplete  Resp Panel by RT-PCR (Flu A&B, Covid) Nasopharyngeal Swab     Status: None   Collection Time: 11/08/20  9:03 PM   Specimen: Nasopharyngeal Swab; Nasopharyngeal(NP) swabs in vial transport medium  Result Value Ref Range Status   SARS Coronavirus 2 by RT PCR NEGATIVE NEGATIVE Final    Comment: (NOTE) SARS-CoV-2 target nucleic acids are NOT DETECTED.  The SARS-CoV-2 RNA is generally detectable in upper respiratory specimens during the acute phase of infection. The lowest concentration of SARS-CoV-2 viral copies this assay can detect is 138 copies/mL. A negative result does not preclude SARS-Cov-2 infection and should not be used as the sole basis for treatment or other patient management decisions. A negative result may occur with  improper specimen collection/handling, submission of specimen other than nasopharyngeal swab, presence of viral mutation(s) within the areas targeted by this assay, and inadequate number of viral copies(<138 copies/mL). A negative result must be combined with clinical observations, patient history, and epidemiological information. The expected result is Negative.  Fact Sheet for Patients:  EntrepreneurPulse.com.au  Fact Sheet for Healthcare Providers:  IncredibleEmployment.be  This test is no t yet approved or cleared by the [place] FDA and  has been authorized for detection and/or diagnosis of SARS-CoV-2 by FDA under an Emergency Use Authorization (EUA). This EUA will remain  in effect  (meaning this test can be used) for the duration of the COVID-19 declaration under Section 564(b)(1) of the Act, 21 U.S.C.section 360bbb-3(b)(1), unless the authorization is terminated  or revoked sooner.       Influenza A by PCR NEGATIVE NEGATIVE Final   Influenza B by PCR NEGATIVE NEGATIVE Final    Comment: (NOTE) The Xpert Xpress SARS-CoV-2/FLU/RSV plus assay is intended as an aid in the diagnosis of influenza from Nasopharyngeal swab specimens and should not be used as a sole basis for treatment. Nasal washings and aspirates are unacceptable for Xpert Xpress SARS-CoV-2/FLU/RSV testing.  Fact Sheet for Patients: EntrepreneurPulse.com.au  Fact Sheet for Healthcare Providers: IncredibleEmployment.be  This test is not yet approved or cleared by the [place] FDA and has been authorized for detection and/or diagnosis of SARS-CoV-2 by FDA under an Emergency Use Authorization (EUA). This EUA will remain in effect (meaning this test can be used) for the duration of the COVID-19 declaration under Section 564(b)(1) of the Act, 21 U.S.C. section 360bbb-3(b)(1), unless the authorization is terminated or revoked.  Performed at [facility], [place] [address], [place], [place] [address]   Body fluid culture w Gram Stain     Status: None   Collection Time: 11/08/20 10:09 PM   Specimen: KNEE; Body Fluid  Result Value Ref Range Status   Specimen Description KNEE  Final   Special Requests NONE  Final   Gram Stain   Final    ABUNDANT WBC PRESENT, PREDOMINANTLY PMN RARE GRAM POSITIVE COCCI IN PAIRS Performed at [facility], [place] [address], [place], [place] [address]    Culture RARE METHICILLIN RESISTANT STAPHYLOCOCCUS AUREUS  Final   Report Status 11/11/2020 FINAL  Final   Organism ID, Bacteria METHICILLIN RESISTANT STAPHYLOCOCCUS AUREUS  Final      Susceptibility   Methicillin resistant staphylococcus aureus - MIC*    CIPROFLOXACIN  <=0.5 SENSITIVE  Sensitive     ERYTHROMYCIN >=8 RESISTANT Resistant     GENTAMICIN <=0.5 SENSITIVE Sensitive     OXACILLIN >=4 RESISTANT Resistant     TETRACYCLINE <=1 SENSITIVE Sensitive     VANCOMYCIN 1 SENSITIVE Sensitive     TRIMETH/SULFA <=10 SENSITIVE Sensitive     CLINDAMYCIN <=0.25 SENSITIVE Sensitive     RIFAMPIN <=0.5 SENSITIVE Sensitive     Inducible Clindamycin NEGATIVE Sensitive     * RARE METHICILLIN RESISTANT STAPHYLOCOCCUS AUREUS  Blood culture (routine x 2)     Status: None (Preliminary result)   Collection Time: 11/08/20 10:23 PM   Specimen: BLOOD RIGHT FOREARM  Result Value Ref Range Status   Specimen Description BLOOD RIGHT FOREARM  Final   Special Requests   Final    BOTTLES DRAWN AEROBIC AND ANAEROBIC Blood Culture adequate volume   Culture   Final    NO GROWTH 4 DAYS Performed at [facility], [address] [place], [place] [address]    Report Status PENDING  Incomplete     Scheduled Meds:  docusate sodium  100 mg Oral BID   feeding supplement  237 mL Oral TID BM   folic acid  1 mg Oral Daily   multivitamin with minerals  1 tablet Oral Daily   nadolol  10 mg Oral Daily   pantoprazole  40 mg Oral Daily   thiamine  100 mg Oral Daily   Continuous Infusions:  ceFEPime (MAXIPIME) IV 2 g (11/12/20 1314)   vancomycin 1,000 mg (11/12/20 0914)     LOS: 4 days   Cherene Altes, MD [facility] Office  [phone] Pager - Text Page per Shea Evans  If 7PM-7AM, please contact night-coverage per Amion 11/12/2020, 9:17 AM

## 2020-11-12 NOTE — Progress Notes (Signed)
Visit to patient to discuss PICC and IV team placement at bedside.  Patient states that he is unwilling to allow IV team to attempt.  Prefers interventional radiology placement.  Message sent to Dr. Thereasa Solo regarding this discussion.

## 2020-11-12 NOTE — H&P (View-Only) (Signed)
Patient ID: Mark [name], male   DOB: 1955/11/12, 65 y.o.   MRN: [id number]  Agitated in the sense that he wants to get out of the hospital.  Tired of sitting around. This was his primary concern. No significant complaints of pain.  Right knee: Incision healed without drainage Warmth No significant effusion  Specimen Description KNEE   Special Requests NONE   Gram Stain ABUNDANT WBC PRESENT, PREDOMINANTLY PMN  RARE GRAM POSITIVE COCCI IN PAIRS  Performed at [facility], [place] [address], [place], [place] [address]   Culture RARE METHICILLIN RESISTANT STAPHYLOCOCCUS AUREUS   Report Status 11/11/2020 FINAL   Organism ID, Bacteria METHICILLIN RESISTANT STAPHYLOCOCCUS AUREUS   Resulting Agency [facility]      Susceptibility   Methicillin resistant staphylococcus aureus    MIC    CIPROFLOXACIN <=0.5 SENSI... Sensitive    CLINDAMYCIN <=0.25 SENS... Sensitive    ERYTHROMYCIN >=8 RESISTANT  Resistant    GENTAMICIN <=0.5 SENSI... Sensitive    Inducible Clindamycin NEGATIVE  Sensitive    OXACILLIN >=4 RESISTANT  Resistant    RIFAMPIN <=0.5 SENSI... Sensitive    TETRACYCLINE <=1 SENSITIVE  Sensitive    TRIMETH/SULFA <=10 SENSIT... Sensitive    VANCOMYCIN 1 SENSITIVE  Sensitive            Susceptibility Comments  Methicillin resistant staphylococcus aureus  RARE METHICILLIN RESISTANT STAPHYLOCOCCUS AUREUS          Assessment: 1. recurrent right total knee revision infection  Plan: I reviewed with him the significance of this challenging problem.  Not only does he have a recurrent infection it is infected with MRSA and this is complicated by having in place revision components.  I stressed the importance of trying to salvage his joint to prevent morbidity of removing well fixed revision components vs amputation.  He agrees to surgery tomorrow We will order PIC line to be placed today to expedite discharge once procedure performed

## 2020-11-13 ENCOUNTER — Inpatient Hospital Stay (HOSPITAL_COMMUNITY): Payer: Medicare (Managed Care) | Admitting: Certified Registered Nurse Anesthetist

## 2020-11-13 ENCOUNTER — Encounter (HOSPITAL_COMMUNITY): Payer: Self-pay | Admitting: Family Medicine

## 2020-11-13 ENCOUNTER — Encounter (HOSPITAL_COMMUNITY)
Admission: EM | Disposition: A | Discharge status: Home Health | Payer: Self-pay | Source: Home / Self Care | Attending: Internal Medicine

## 2020-11-13 HISTORY — PX: IRRIGATION AND DEBRIDEMENT KNEE: SHX5185

## 2020-11-13 LAB — SURGICAL PCR SCREEN
MRSA, PCR: NEGATIVE
Staphylococcus aureus: NEGATIVE

## 2020-11-13 LAB — CULTURE, BLOOD (ROUTINE X 2)
Culture: NO GROWTH
Culture: NO GROWTH
Special Requests: ADEQUATE
Special Requests: ADEQUATE

## 2020-11-13 SURGERY — IRRIGATION AND DEBRIDEMENT KNEE
Anesthesia: General | Site: Knee | Laterality: Right

## 2020-11-13 MED ORDER — OXYCODONE HCL 5 MG PO TABS
10.0000 mg | ORAL_TABLET | ORAL | Status: DC | PRN
  Administered 2020-11-14: 10 mg via ORAL
  Filled 2020-11-13: qty 2

## 2020-11-13 MED ORDER — METOCLOPRAMIDE HCL 5 MG PO TABS
5.0000 mg | ORAL_TABLET | Freq: Three times a day (TID) | ORAL | Status: DC | PRN
Start: 2020-11-13 — End: 2020-11-15

## 2020-11-13 MED ORDER — ONDANSETRON HCL 4 MG/2ML IJ SOLN
INTRAMUSCULAR | Status: DC | PRN
  Administered 2020-11-13: 4 mg via INTRAVENOUS

## 2020-11-13 MED ORDER — BUPIVACAINE-EPINEPHRINE (PF) 0.5% -1:200000 IJ SOLN
INTRAMUSCULAR | Status: DC | PRN
  Administered 2020-11-13: 20 mL via PERINEURAL

## 2020-11-13 MED ORDER — CHLORHEXIDINE GLUCONATE 0.12 % MT SOLN
15.0000 mL | Freq: Once | OROMUCOSAL | Status: AC
  Administered 2020-11-13: 15 mL via OROMUCOSAL

## 2020-11-13 MED ORDER — VANCOMYCIN HCL 1000 MG/200ML IV SOLN
1000.0000 mg | INTRAVENOUS | Status: AC
  Administered 2020-11-13: 1000 mg via INTRAVENOUS

## 2020-11-13 MED ORDER — LACTATED RINGERS IV SOLN: INTRAVENOUS | Status: DC

## 2020-11-13 MED ORDER — ACETAMINOPHEN 10 MG/ML IV SOLN
INTRAVENOUS | Status: AC
  Administered 2020-11-13: 1000 mg
  Filled 2020-11-13: qty 100

## 2020-11-13 MED ORDER — METHOCARBAMOL 500 MG PO TABS: 500.0000 mg | ORAL_TABLET | Freq: Four times a day (QID) | ORAL | Status: DC | PRN

## 2020-11-13 MED ORDER — KETOROLAC TROMETHAMINE 30 MG/ML IJ SOLN: 15.0000 mg | Freq: Once | INTRAMUSCULAR | Status: DC

## 2020-11-13 MED ORDER — KETAMINE HCL 10 MG/ML IJ SOLN
INTRAMUSCULAR | Status: DC | PRN
  Administered 2020-11-13: 50 mg via INTRAVENOUS

## 2020-11-13 MED ORDER — DOCUSATE SODIUM 100 MG PO CAPS
100.0000 mg | ORAL_CAPSULE | Freq: Two times a day (BID) | ORAL | Status: DC
  Administered 2020-11-14: 100 mg via ORAL
  Filled 2020-11-13 (×2): qty 1

## 2020-11-13 MED ORDER — CEFAZOLIN SODIUM 2 G IJ SOLR
2.0000 g | Freq: Four times a day (QID) | INTRAMUSCULAR | Status: AC
  Administered 2020-11-14 (×2): 2 g via INTRAVENOUS
  Filled 2020-11-13 (×2): qty 2

## 2020-11-13 MED ORDER — 0.9 % SODIUM CHLORIDE (POUR BTL) OPTIME
TOPICAL | Status: DC | PRN
  Administered 2020-11-13: 1000 mL

## 2020-11-13 MED ORDER — TRANEXAMIC ACID-NACL 1000-0.7 MG/100ML-% IV SOLN
1000.0000 mg | INTRAVENOUS | Status: AC
  Administered 2020-11-13: 1000 mg via INTRAVENOUS
  Filled 2020-11-13: qty 100

## 2020-11-13 MED ORDER — FENTANYL CITRATE (PF) 250 MCG/5ML IJ SOLN
INTRAMUSCULAR | Status: AC
  Filled 2020-11-13: qty 5

## 2020-11-13 MED ORDER — HYDROMORPHONE HCL 1 MG/ML IJ SOLN
INTRAMUSCULAR | Status: AC
  Filled 2020-11-13: qty 2

## 2020-11-13 MED ORDER — MIDAZOLAM HCL 2 MG/2ML IJ SOLN
INTRAMUSCULAR | Status: AC
  Filled 2020-11-13: qty 2

## 2020-11-13 MED ORDER — MENTHOL 3 MG MT LOZG: 1.0000 | LOZENGE | OROMUCOSAL | Status: DC | PRN

## 2020-11-13 MED ORDER — FERROUS SULFATE 325 (65 FE) MG PO TABS
325.0000 mg | ORAL_TABLET | Freq: Three times a day (TID) | ORAL | Status: DC
  Administered 2020-11-14 – 2020-11-15 (×3): 325 mg via ORAL
  Filled 2020-11-13 (×3): qty 1

## 2020-11-13 MED ORDER — PROPOFOL 10 MG/ML IV BOLUS
INTRAVENOUS | Status: DC | PRN
  Administered 2020-11-13: 30 mg via INTRAVENOUS
  Administered 2020-11-13: 120 mg via INTRAVENOUS

## 2020-11-13 MED ORDER — HYDROMORPHONE HCL 2 MG/ML IJ SOLN
INTRAMUSCULAR | Status: AC
  Filled 2020-11-13: qty 1

## 2020-11-13 MED ORDER — CLONIDINE HCL (ANALGESIA) 100 MCG/ML EP SOLN
EPIDURAL | Status: DC | PRN
  Administered 2020-11-13: 100 ug

## 2020-11-13 MED ORDER — ONDANSETRON HCL 4 MG/2ML IJ SOLN: 4.0000 mg | Freq: Four times a day (QID) | INTRAMUSCULAR | Status: DC | PRN

## 2020-11-13 MED ORDER — HYDROMORPHONE HCL 1 MG/ML IJ SOLN
0.5000 mg | INTRAMUSCULAR | Status: DC | PRN
  Administered 2020-11-14: 1 mg via INTRAVENOUS
  Filled 2020-11-13: qty 1

## 2020-11-13 MED ORDER — KETAMINE HCL 10 MG/ML IJ SOLN
INTRAMUSCULAR | Status: AC
  Filled 2020-11-13: qty 1

## 2020-11-13 MED ORDER — HYDROMORPHONE HCL 1 MG/ML IJ SOLN
0.2500 mg | INTRAMUSCULAR | Status: DC | PRN
  Administered 2020-11-13 (×4): 0.5 mg via INTRAVENOUS

## 2020-11-13 MED ORDER — ACETAMINOPHEN 10 MG/ML IV SOLN: 1000.0000 mg | Freq: Once | INTRAVENOUS | Status: DC | PRN

## 2020-11-13 MED ORDER — LIDOCAINE 2% (20 MG/ML) 5 ML SYRINGE
INTRAMUSCULAR | Status: AC
  Filled 2020-11-13: qty 5

## 2020-11-13 MED ORDER — HYDROMORPHONE HCL 1 MG/ML IJ SOLN
0.5000 mg | Freq: Once | INTRAMUSCULAR | Status: AC
  Administered 2020-11-13: 0.5 mg via INTRAVENOUS

## 2020-11-13 MED ORDER — CEFAZOLIN SODIUM-DEXTROSE 2-4 GM/100ML-% IV SOLN
2.0000 g | Freq: Four times a day (QID) | INTRAVENOUS | Status: DC
Start: 2020-11-14 — End: 2020-11-13
  Filled 2020-11-13 (×2): qty 100

## 2020-11-13 MED ORDER — LIDOCAINE 2% (20 MG/ML) 5 ML SYRINGE
INTRAMUSCULAR | Status: DC | PRN
  Administered 2020-11-13: 80 mg via INTRAVENOUS

## 2020-11-13 MED ORDER — KETOROLAC TROMETHAMINE 30 MG/ML IJ SOLN
INTRAMUSCULAR | Status: AC
  Administered 2020-11-13: 15 mg via INTRAVENOUS
  Filled 2020-11-13: qty 1

## 2020-11-13 MED ORDER — METOCLOPRAMIDE HCL 5 MG/ML IJ SOLN
5.0000 mg | Freq: Three times a day (TID) | INTRAMUSCULAR | Status: DC | PRN
Start: 2020-11-13 — End: 2020-11-15

## 2020-11-13 MED ORDER — HYDROMORPHONE HCL 1 MG/ML IJ SOLN
INTRAMUSCULAR | Status: AC
  Filled 2020-11-13: qty 1

## 2020-11-13 MED ORDER — OXYCODONE HCL 5 MG PO TABS
5.0000 mg | ORAL_TABLET | ORAL | Status: DC | PRN
  Administered 2020-11-14: 5 mg via ORAL
  Administered 2020-11-14 – 2020-11-15 (×4): 10 mg via ORAL
  Filled 2020-11-13 (×3): qty 2
  Filled 2020-11-13: qty 1
  Filled 2020-11-13: qty 2

## 2020-11-13 MED ORDER — VANCOMYCIN HCL IN DEXTROSE 1-5 GM/200ML-% IV SOLN
INTRAVENOUS | Status: AC
  Filled 2020-11-13: qty 200

## 2020-11-13 MED ORDER — DEXMEDETOMIDINE (PRECEDEX) IN NS 20 MCG/5ML (4 MCG/ML) IV SYRINGE
PREFILLED_SYRINGE | INTRAVENOUS | Status: AC
  Filled 2020-11-13: qty 5

## 2020-11-13 MED ORDER — METHOCARBAMOL 1000 MG/10ML IJ SOLN
500.0000 mg | Freq: Four times a day (QID) | INTRAVENOUS | Status: DC | PRN
  Filled 2020-11-13: qty 5

## 2020-11-13 MED ORDER — HYDROMORPHONE HCL 1 MG/ML IJ SOLN: 0.5000 mg | Freq: Once | INTRAMUSCULAR | Status: DC

## 2020-11-13 MED ORDER — ACETAMINOPHEN 325 MG PO TABS: 325.0000 mg | ORAL_TABLET | Freq: Four times a day (QID) | ORAL | Status: DC | PRN

## 2020-11-13 MED ORDER — POVIDONE-IODINE 10 % EX SWAB
2.0000 "application " | Freq: Once | CUTANEOUS | Status: AC
  Administered 2020-11-13: 2 via TOPICAL

## 2020-11-13 MED ORDER — ONDANSETRON HCL 4 MG PO TABS: 4.0000 mg | ORAL_TABLET | Freq: Four times a day (QID) | ORAL | Status: DC | PRN

## 2020-11-13 MED ORDER — ONDANSETRON HCL 4 MG/2ML IJ SOLN
INTRAMUSCULAR | Status: AC
  Filled 2020-11-13: qty 2

## 2020-11-13 MED ORDER — ASPIRIN 81 MG PO CHEW
81.0000 mg | CHEWABLE_TABLET | Freq: Two times a day (BID) | ORAL | Status: DC
  Administered 2020-11-14 – 2020-11-15 (×3): 81 mg via ORAL
  Filled 2020-11-13 (×3): qty 1

## 2020-11-13 MED ORDER — POLYETHYLENE GLYCOL 3350 17 G PO PACK: 17.0000 g | PACK | Freq: Every day | ORAL | Status: DC | PRN

## 2020-11-13 MED ORDER — HYDROMORPHONE HCL 1 MG/ML IJ SOLN
INTRAMUSCULAR | Status: DC | PRN
  Administered 2020-11-13: 1 mg via INTRAVENOUS
  Administered 2020-11-13 (×2): .5 mg via INTRAVENOUS
  Administered 2020-11-13: 1 mg via INTRAVENOUS

## 2020-11-13 MED ORDER — FENTANYL CITRATE (PF) 100 MCG/2ML IJ SOLN
INTRAMUSCULAR | Status: DC | PRN
  Administered 2020-11-13: 50 ug via INTRAVENOUS
  Administered 2020-11-13: 100 ug via INTRAVENOUS
  Administered 2020-11-13 (×2): 50 ug via INTRAVENOUS

## 2020-11-13 MED ORDER — CHLORHEXIDINE GLUCONATE 4 % EX LIQD
60.0000 mL | Freq: Once | CUTANEOUS | Status: AC
  Administered 2020-11-13: 4 via TOPICAL
  Filled 2020-11-13: qty 60

## 2020-11-13 MED ORDER — PROMETHAZINE HCL 25 MG/ML IJ SOLN: 6.2500 mg | INTRAMUSCULAR | Status: DC | PRN

## 2020-11-13 MED ORDER — POVIDONE-IODINE 10 % EX SWAB: 2.0000 "application " | Freq: Once | CUTANEOUS | Status: DC

## 2020-11-13 MED ORDER — DEXAMETHASONE SODIUM PHOSPHATE 10 MG/ML IJ SOLN
10.0000 mg | Freq: Once | INTRAMUSCULAR | Status: AC
  Administered 2020-11-14: 10 mg via INTRAVENOUS
  Filled 2020-11-13: qty 1

## 2020-11-13 MED ORDER — DIPHENHYDRAMINE HCL 12.5 MG/5ML PO ELIX: 12.5000 mg | ORAL_SOLUTION | ORAL | Status: DC | PRN

## 2020-11-13 MED ORDER — TRANEXAMIC ACID-NACL 1000-0.7 MG/100ML-% IV SOLN
1000.0000 mg | Freq: Once | INTRAVENOUS | Status: AC
  Administered 2020-11-14: 1000 mg via INTRAVENOUS
  Filled 2020-11-13: qty 100

## 2020-11-13 MED ORDER — BISACODYL 10 MG RE SUPP: 10.0000 mg | Freq: Every day | RECTAL | Status: DC | PRN

## 2020-11-13 MED ORDER — SODIUM CHLORIDE 0.9 % IR SOLN
Status: DC | PRN
  Administered 2020-11-13: 3000 mL

## 2020-11-13 MED ORDER — SODIUM CHLORIDE 0.9 % IV SOLN: INTRAVENOUS | Status: DC

## 2020-11-13 MED ORDER — DEXMEDETOMIDINE (PRECEDEX) IN NS 20 MCG/5ML (4 MCG/ML) IV SYRINGE
PREFILLED_SYRINGE | INTRAVENOUS | Status: DC | PRN
  Administered 2020-11-13: 8 ug via INTRAVENOUS

## 2020-11-13 MED ORDER — PHENOL 1.4 % MT LIQD: 1.0000 | OROMUCOSAL | Status: DC | PRN

## 2020-11-13 MED ORDER — MIDAZOLAM HCL 5 MG/5ML IJ SOLN
INTRAMUSCULAR | Status: DC | PRN
  Administered 2020-11-13: 1 mg via INTRAVENOUS
  Administered 2020-11-13: 2 mg via INTRAVENOUS
  Administered 2020-11-13: 1 mg via INTRAVENOUS

## 2020-11-13 SURGICAL SUPPLY — 45 items
ADH SKN CLS APL DERMABOND .7 (GAUZE/BANDAGES/DRESSINGS) ×1
ATTUNE PSRP INSR SZ6 5 KNEE (Insert) ×1 IMPLANT
BAG COUNTER SPONGE SURGICOUNT (BAG) IMPLANT
BAG SPEC THK2 15X12 ZIP CLS (MISCELLANEOUS) ×1
BAG SPNG CNTER NS LX DISP (BAG)
BAG ZIPLOCK 12X15 (MISCELLANEOUS) ×2 IMPLANT
BLADE SAW SGTL 11.0X1.19X90.0M (BLADE) IMPLANT
BNDG ELASTIC 6X5.8 VLCR STR LF (GAUZE/BANDAGES/DRESSINGS) ×2 IMPLANT
COVER SURGICAL LIGHT HANDLE (MISCELLANEOUS) ×2 IMPLANT
CUFF TOURN SGL QUICK 34 (TOURNIQUET CUFF) ×2
CUFF TRNQT CYL 34X4.125X (TOURNIQUET CUFF) ×1 IMPLANT
DERMABOND ADVANCED (GAUZE/BANDAGES/DRESSINGS) ×1
DERMABOND ADVANCED .7 DNX12 (GAUZE/BANDAGES/DRESSINGS) ×1 IMPLANT
DRAPE SHEET LG 3/4 BI-LAMINATE (DRAPES) ×2 IMPLANT
DRSG ADAPTIC 3X8 NADH LF (GAUZE/BANDAGES/DRESSINGS) IMPLANT
DRSG AQUACEL AG ADV 3.5X10 (GAUZE/BANDAGES/DRESSINGS) ×2 IMPLANT
DRSG AQUACEL AG ADV 3.5X14 (GAUZE/BANDAGES/DRESSINGS) ×1 IMPLANT
DRSG PAD ABDOMINAL 8X10 ST (GAUZE/BANDAGES/DRESSINGS) IMPLANT
DRSG TEGADERM 4X4.75 (GAUZE/BANDAGES/DRESSINGS) IMPLANT
DURAPREP 26ML APPLICATOR (WOUND CARE) ×4 IMPLANT
EVACUATOR 1/8 PVC DRAIN (DRAIN) IMPLANT
GAUZE SPONGE 2X2 8PLY STRL LF (GAUZE/BANDAGES/DRESSINGS) IMPLANT
GAUZE SPONGE 4X4 12PLY STRL (GAUZE/BANDAGES/DRESSINGS) IMPLANT
GLOVE SURG ENC MOIS LTX SZ6 (GLOVE) IMPLANT
GLOVE SURG UNDER POLY LF SZ6.5 (GLOVE) IMPLANT
GLOVE SURG UNDER POLY LF SZ7.5 (GLOVE) ×6 IMPLANT
GOWN STRL REUS W/TWL LRG LVL3 (GOWN DISPOSABLE) ×2 IMPLANT
HANDPIECE INTERPULSE COAX TIP (DISPOSABLE) ×2
KIT TURNOVER KIT A (KITS) ×2 IMPLANT
MANIFOLD NEPTUNE II (INSTRUMENTS) ×2 IMPLANT
PACK TOTAL KNEE CUSTOM (KITS) ×2 IMPLANT
PADDING CAST COTTON 6X4 STRL (CAST SUPPLIES) ×2 IMPLANT
PENCIL SMOKE EVACUATOR (MISCELLANEOUS) ×2 IMPLANT
PROTECTOR NERVE ULNAR (MISCELLANEOUS) ×2 IMPLANT
SET HNDPC FAN SPRY TIP SCT (DISPOSABLE) ×1 IMPLANT
SET PAD KNEE POSITIONER (MISCELLANEOUS) ×2 IMPLANT
SPONGE GAUZE 2X2 STER 10/PKG (GAUZE/BANDAGES/DRESSINGS)
STAPLER VISISTAT 35W (STAPLE) IMPLANT
SUT MNCRL AB 4-0 PS2 18 (SUTURE) ×2 IMPLANT
SUT STRATAFIX PDS+ 0 24IN (SUTURE) ×2 IMPLANT
SUT VIC AB 1 CT1 36 (SUTURE) ×4 IMPLANT
SUT VIC AB 2-0 CT1 27 (SUTURE) ×6
SUT VIC AB 2-0 CT1 TAPERPNT 27 (SUTURE) ×3 IMPLANT
SWAB COLLECTION DEVICE MRSA (MISCELLANEOUS) ×2 IMPLANT
SWAB CULTURE ESWAB REG 1ML (MISCELLANEOUS) ×2 IMPLANT

## 2020-11-13 NOTE — Brief Op Note (Signed)
11/08/2020 - 11/13/2020  6:45 PM  PATIENT:  Mark [name]  65 y.o. male  PRE-OPERATIVE DIAGNOSIS:  INFECTED RIGHT TOTAL KNEE REVISION  POST-OPERATIVE DIAGNOSIS:  INFECTED RIGHT TOTAL KNEE REVISION  PROCEDURE:  Procedure(s): IRRIGATION AND DEBRIDEMENT KNEE (Right) with polyethylene revision  SURGEON:  Surgeon(s) and Role:    Paralee Cancel, MD - Primary  PHYSICIAN ASSISTANT: Costella Hatcher, PA-C  ANESTHESIA:   general  EBL:  <50 cc due to tourniquet use  BLOOD ADMINISTERED:none  DRAINS: none   LOCAL MEDICATIONS USED:  Vancomycin powder  SPECIMEN:  No Specimen  DISPOSITION OF SPECIMEN:  N/A  COUNTS:  YES  TOURNIQUET:  59 min at 250 mmHg  DICTATION: .Other Dictation: Dictation Number [id number]  PLAN OF CARE: Admit to inpatient   PATIENT DISPOSITION:  PACU - hemodynamically stable.   Delay start of Pharmacological VTE agent (>24hrs) due to surgical blood loss or risk of bleeding: no

## 2020-11-13 NOTE — Progress Notes (Signed)
Mark [name]  IWL:[id number] DOB: Feb 16, 1956 DOA: 11/08/2020 PCP: [name], MD    Brief Narrative:  [phone] with a history of alcoholic liver cirrhosis and chronic anemia who presented to the ED with increasing pain and swelling of his right knee.  The patient underwent a right total knee arthroplasty in 8144 which was complicated by an infection and required explantation November 2021 with placement of an antibiotic spacer and long-term antibiotic treatment.  In April 2022 he underwent replacement of his total knee.  He was prescribed lifelong doxycycline at the direction of the Infectious Disease service but ran out approximately 3 weeks prior to this admission.  Orthopedics evaluated the patient in the ED and performed an arthrocentesis.  Arrangements were then made for him to be transferred from [facility] ED to [facility] ED for planned orthopedic procedure.  He was also found to be significantly anemic and therefore was transfused while still in the ED at [facility].  Significant Events:  2015 R knee TKA 02/2020 removal TKA - abx spacer placed 4/22 redo TKA 7/14 admit via [facility] w/ recurrent septic R knee 7/14 arthrocentesis right knee in the ED 7/14 1 U PRBC 7/15 1 U PRBC  Consultants:  Orthopedic Surgery  Code Status: NO CODE BLUE  Antimicrobials:  Vancomycin 7/15 > Zosyn 7/15 Cefepime 7/16 > 7/18  DVT prophylaxis: SCDs  Subjective: Medical issues stable at this time. In OR under care of Orthopedic Surgery.   Assessment & Plan:  Recurrent MRSA septic right knee TKA Patient was noncompliant with prescribed lifelong doxycycline suppression therapy - care per Orthopedic Surgery with plan for washout in OR today  Transient SVT Appreciated while in ED in setting of DH/anemia - asymptomatic  Acute on chronic normocytic anemia - anemia of chronic disease Due to alcoholism as well as smoldering infection and poor nutrition - no evidence of gross bleeding -  hemoglobin 5.7 on admission - does have a known history of ulcerative duodenitis noted on EGD 2019 - denies any clear GI bleeding - transfused a total of 2 units PRBC thus far - Hgb stable   Recent Labs  Lab 11/08/20 1803 11/09/20 0513 11/10/20 0438 11/11/20 0534 11/12/20 0405  HGB 5.7* 7.1* 6.8* 8.4* 9.1*     Acute kidney injury (not POA) Creatinine climbed somewhat following admission but improved again with volume expansion and transfusion - now stable   Recent Labs  Lab 11/08/20 1803 11/09/20 0513 11/10/20 0438 11/11/20 0534 11/12/20 0405  CREATININE 1.18 1.09 1.45* 1.18 1.18     Leukopenia HIV negative -likely due to bone marrow suppression in setting of alcoholism - WBC has normalized   Alcoholism -cirrhosis of the liver Continue usual home medical therapies - no evidence of alcohol withdrawal thus far  CAD Aspirin on hold - asymptomatic  Hyponatremia Due to alcoholism as well as volume depletion - resolved with volume expansion    Family Communication:  Status is: Inpatient  Remains inpatient appropriate because:Inpatient level of care appropriate due to severity of illness  Dispo: The patient is from: Home              Anticipated d/c is to:  unclear              Patient currently is not medically stable to d/c.   Difficult to place patient No  Objective: Blood pressure 132/72, pulse (!) 59, temperature 98.4 F (36.9 C), temperature source Oral, resp. rate 16, height 5\' 7"  (1.702 m),  weight 56 kg, SpO2 100 %.  Intake/Output Summary (Last 24 hours) at 11/13/2020 0746 Last data filed at 11/12/2020 0900 Gross per 24 hour  Intake 240 ml  Output --  Net 240 ml    Filed Weights   11/09/20 0529  Weight: 56 kg    Examination: No exam possible as pt in OR  CBC: Recent Labs  Lab 11/08/20 1803 11/09/20 0513 11/10/20 0438 11/11/20 0534 11/12/20 0405  WBC 3.3*   < > 2.9* 3.3* 5.6  NEUTROABS 1.7  --   --   --   --   HGB 5.7*   < > 6.8* 8.4*  9.1*  HCT 17.8*   < > 21.6* 25.6* 28.1*  MCV 97.3   < > 97.7 94.5 96.2  PLT 223   < > 196 183 199   < > = values in this interval not displayed.    Basic Metabolic Panel: Recent Labs  Lab 11/10/20 0438 11/11/20 0534 11/12/20 0405  NA 140 139 142  K 4.0 3.8 4.0  CL 112* 111 114*  CO2 22 21* 21*  GLUCOSE 109* 73 82  BUN 18 18 17   CREATININE 1.45* 1.18 1.18  CALCIUM 7.9* 8.1* 8.4*  MG  --  1.9  --   PHOS  --  2.9  --     GFR: Estimated Creatinine Clearance: 48.1 mL/min (by C-G formula based on SCr of 1.18 mg/dL).  Liver Function Tests: Recent Labs  Lab 11/08/20 1803 11/11/20 0534  AST 23 20  ALT 9 9  ALKPHOS 99 77  BILITOT 0.5 0.9  PROT 7.8 7.1  ALBUMIN 2.2* 2.2*      HbA1C: Hgb A1c MFr Bld  Date/Time Value Ref Range Status  11/01/2009 04:55 PM  <5.7 % Final   5.1 (NOTE)                                                                       According to the ADA Clinical Practice Recommendations for 2011, when HbA1c is used as a screening test:   >=6.5%   Diagnostic of Diabetes Mellitus           (if abnormal result  is confirmed)  5.7-6.4%   Increased risk of developing Diabetes Mellitus  References:Diagnosis and Classification of Diabetes Mellitus,Diabetes XTGG,2694,85(IOEVO 1):S62-S69 and Standards of Medical Care in         Diabetes - 2011,Diabetes Care,2011,34  (Suppl 1):S11-S61.    Recent Results (from the past 240 hour(s))  Blood culture (routine x 2)     Status: None   Collection Time: 11/08/20  6:15 PM   Specimen: BLOOD LEFT ARM  Result Value Ref Range Status   Specimen Description BLOOD LEFT ARM  Final   Special Requests   Final    BOTTLES DRAWN AEROBIC AND ANAEROBIC Blood Culture adequate volume   Culture   Final    NO GROWTH 5 DAYS Performed at [facility], [address] [place], [place] [address]    Report Status 11/13/2020 FINAL  Final  Resp Panel by RT-PCR (Flu A&B, Covid) Nasopharyngeal Swab     Status: None   Collection Time:  11/08/20  9:03 PM   Specimen: Nasopharyngeal Swab; Nasopharyngeal(NP) swabs in vial  transport medium  Result Value Ref Range Status   SARS Coronavirus 2 by RT PCR NEGATIVE NEGATIVE Final    Comment: (NOTE) SARS-CoV-2 target nucleic acids are NOT DETECTED.  The SARS-CoV-2 RNA is generally detectable in upper respiratory specimens during the acute phase of infection. The lowest concentration of SARS-CoV-2 viral copies this assay can detect is 138 copies/mL. A negative result does not preclude SARS-Cov-2 infection and should not be used as the sole basis for treatment or other patient management decisions. A negative result may occur with  improper specimen collection/handling, submission of specimen other than nasopharyngeal swab, presence of viral mutation(s) within the areas targeted by this assay, and inadequate number of viral copies(<138 copies/mL). A negative result must be combined with clinical observations, patient history, and epidemiological information. The expected result is Negative.  Fact Sheet for Patients:  EntrepreneurPulse.com.au  Fact Sheet for Healthcare Providers:  IncredibleEmployment.be  This test is no t yet approved or cleared by the [place] FDA and  has been authorized for detection and/or diagnosis of SARS-CoV-2 by FDA under an Emergency Use Authorization (EUA). This EUA will remain  in effect (meaning this test can be used) for the duration of the COVID-19 declaration under Section 564(b)(1) of the Act, 21 U.S.C.section 360bbb-3(b)(1), unless the authorization is terminated  or revoked sooner.       Influenza A by PCR NEGATIVE NEGATIVE Final   Influenza B by PCR NEGATIVE NEGATIVE Final    Comment: (NOTE) The Xpert Xpress SARS-CoV-2/FLU/RSV plus assay is intended as an aid in the diagnosis of influenza from Nasopharyngeal swab specimens and should not be used as a sole basis for treatment. Nasal washings  and aspirates are unacceptable for Xpert Xpress SARS-CoV-2/FLU/RSV testing.  Fact Sheet for Patients: EntrepreneurPulse.com.au  Fact Sheet for Healthcare Providers: IncredibleEmployment.be  This test is not yet approved or cleared by the [place] FDA and has been authorized for detection and/or diagnosis of SARS-CoV-2 by FDA under an Emergency Use Authorization (EUA). This EUA will remain in effect (meaning this test can be used) for the duration of the COVID-19 declaration under Section 564(b)(1) of the Act, 21 U.S.C. section 360bbb-3(b)(1), unless the authorization is terminated or revoked.  Performed at [facility], [place] [address] [place], [place] [address]   Body fluid culture w Gram Stain     Status: None   Collection Time: 11/08/20 10:09 PM   Specimen: KNEE; Body Fluid  Result Value Ref Range Status   Specimen Description KNEE  Final   Special Requests NONE  Final   Gram Stain   Final    ABUNDANT WBC PRESENT, PREDOMINANTLY PMN RARE GRAM POSITIVE COCCI IN PAIRS Performed at [facility], [place] [address] [place], [place] [address]    Culture RARE METHICILLIN RESISTANT STAPHYLOCOCCUS AUREUS  Final   Report Status 11/11/2020 FINAL  Final   Organism ID, Bacteria METHICILLIN RESISTANT STAPHYLOCOCCUS AUREUS  Final      Susceptibility   Methicillin resistant staphylococcus aureus - MIC*    CIPROFLOXACIN <=0.5 SENSITIVE Sensitive     ERYTHROMYCIN >=8 RESISTANT Resistant     GENTAMICIN <=0.5 SENSITIVE Sensitive     OXACILLIN >=4 RESISTANT Resistant     TETRACYCLINE <=1 SENSITIVE Sensitive     VANCOMYCIN 1 SENSITIVE Sensitive     TRIMETH/SULFA <=10 SENSITIVE Sensitive     CLINDAMYCIN <=0.25 SENSITIVE Sensitive     RIFAMPIN <=0.5 SENSITIVE Sensitive     Inducible Clindamycin NEGATIVE Sensitive     *  RARE METHICILLIN RESISTANT STAPHYLOCOCCUS AUREUS  Blood culture (routine x 2)     Status: None   Collection Time: 11/08/20  10:23 PM   Specimen: BLOOD RIGHT FOREARM  Result Value Ref Range Status   Specimen Description BLOOD RIGHT FOREARM  Final   Special Requests   Final    BOTTLES DRAWN AEROBIC AND ANAEROBIC Blood Culture adequate volume   Culture   Final    NO GROWTH 5 DAYS Performed at [facility], [address], [place], [place] [address]    Report Status 11/13/2020 FINAL  Final  Surgical pcr screen     Status: None   Collection Time: 11/13/20  3:40 AM   Specimen: Nasal Mucosa; Nasal Swab  Result Value Ref Range Status   MRSA, PCR NEGATIVE NEGATIVE Final   Staphylococcus aureus NEGATIVE NEGATIVE Final    Comment: (NOTE) The Xpert SA Assay (FDA approved for NASAL specimens in patients 75 years of age and older), is one component of a comprehensive surveillance program. It is not intended to diagnose infection nor to guide or monitor treatment. Performed at [facility], [place] [address], [place], [place] [address]      Scheduled Meds:  docusate sodium  100 mg Oral BID   feeding supplement  237 mL Oral TID BM   folic acid  1 mg Oral Daily   multivitamin with minerals  1 tablet Oral Daily   nadolol  10 mg Oral Daily   pantoprazole  40 mg Oral Daily   povidone-iodine  2 application Topical Once   thiamine  100 mg Oral Daily   Continuous Infusions:  tranexamic acid     vancomycin 1,000 mg (11/12/20 0914)   vancomycin       LOS: 5 days   Cherene Altes, MD [facility] Office  [phone] Pager - Text Page per Shea Evans  If 7PM-7AM, please contact night-coverage per Amion 11/13/2020, 7:46 AM

## 2020-11-13 NOTE — Interval H&P Note (Signed)
History and Physical Interval Note:  11/13/2020 3:05 PM  Mark [name]  has presented today for surgery, with the diagnosis of INFECTED RIGHT TOTAL KNEE REVISION.  The various methods of treatment have been discussed with the patient and family. After consideration of risks, benefits and other options for treatment, the patient has consented to  Procedure(s): IRRIGATION AND DEBRIDEMENT KNEE (Right) as a surgical intervention.  The patient's history has been reviewed, patient examined, no change in status, stable for surgery.  I have reviewed the patient's chart and labs.  Questions were answered to the patient's satisfaction.     Mauri Pole

## 2020-11-13 NOTE — Anesthesia Postprocedure Evaluation (Signed)
Anesthesia Post Note  Patient: Mark [name]  Procedure(s) Performed: IRRIGATION AND DEBRIDEMENT KNEE (Right: Knee)     Patient location during evaluation: PACU Anesthesia Type: General Level of consciousness: awake and alert and oriented Pain management: pain level controlled Vital Signs Assessment: post-procedure vital signs reviewed and stable Respiratory status: spontaneous breathing, nonlabored ventilation and respiratory function stable Cardiovascular status: blood pressure returned to baseline Postop Assessment: no apparent nausea or vomiting Anesthetic complications: no Comments: Pain not controlled after IV dilaudid in excess of 5mg  in PACU. On further questioning, patient admits to near daily heroin use prior to hospital admission. Patient and his daughter (by telephone) gave consent for femoral nerve block. Following block, patient denies pain, RR 12, VSS on discharge to floor. Spoke to Dr. Alvan Dame by telephone to provide update on patient status and likelihood of high requirement for pain medications postoperatively. Daiva Huge, MD   No notable events documented.  Last Vitals:  Vitals:   11/13/20 2130 11/13/20 2145  BP: (!) 155/56 (!) 155/79  Pulse:  (!) 51  Resp:  18  Temp:    SpO2:      Last Pain:  Vitals:   11/13/20 1153  TempSrc: Oral  PainSc:                  Brennan Bailey

## 2020-11-13 NOTE — Progress Notes (Signed)
Patient ID: Mark [name], male   DOB: 16-Sep-1955, 65 y.o.   MRN: [id number]  To OR today for attempted salvage of his right total knee revision NPO Consent signed Needs [facility] ID consult

## 2020-11-13 NOTE — Anesthesia Preprocedure Evaluation (Addendum)
Anesthesia Evaluation    Reviewed: Allergy & Precautions, Patient's Chart, lab work & pertinent test results  History of Anesthesia Complications Negative for: history of anesthetic complications  Airway Mallampati: II  TM Distance: >3 FB Neck ROM: Full    Dental  (+) Edentulous Upper, Edentulous Lower   Pulmonary former smoker,    Pulmonary exam normal        Cardiovascular hypertension, Pt. on medications + CAD and + Past MI (2012)  Normal cardiovascular exam     Neuro/Psych CVA (2012) negative psych ROS   GI/Hepatic PUD, GERD  Medicated and Controlled,(+) Cirrhosis   ascites  substance abuse  alcohol use and marijuana use,   Endo/Other  negative endocrine ROS  Renal/GU Renal InsufficiencyRenal disease  negative genitourinary   Musculoskeletal  (+) Arthritis , Septic right knee   Abdominal   Peds  Hematology  (+) anemia , Hgb 9.1   Anesthesia Other Findings Day of surgery medications reviewed with patient.  Reproductive/Obstetrics negative OB ROS                            Anesthesia Physical Anesthesia Plan  ASA: 3  Anesthesia Plan: General   Post-op Pain Management:    Induction: Intravenous  PONV Risk Score and Plan: 3 and Treatment may vary due to age or medical condition, Ondansetron, Dexamethasone and Midazolam  Airway Management Planned: LMA  Additional Equipment: None  Intra-op Plan:   Post-operative Plan: Extubation in OR  Informed Consent: I have reviewed the patients History and Physical, chart, labs and discussed the procedure including the risks, benefits and alternatives for the proposed anesthesia with the patient or authorized representative who has indicated his/her understanding and acceptance.   Patient has DNR.  Discussed DNR with patient and Suspend DNR.   Dental advisory given  Plan Discussed with: CRNA  Anesthesia Plan Comments: (Patient  wishes to be full code perioperatively. Daiva Huge, MD)       Anesthesia Quick Evaluation

## 2020-11-13 NOTE — Transfer of Care (Signed)
Immediate Anesthesia Transfer of Care Note  Patient: Mark [name]  Procedure(s) Performed: Procedure(s): IRRIGATION AND DEBRIDEMENT KNEE (Right)  Patient Location: PACU  Anesthesia Type:General  Level of Consciousness: Alert, Awake, Oriented  Airway & Oxygen Therapy: Patient Spontanous Breathing  Post-op Assessment: Report given to RN  Post vital signs: Reviewed and stable  Last Vitals:  Vitals:   11/13/20 0411 11/13/20 1153  BP: 132/72 124/69  Pulse: (!) 59 (!) 58  Resp: 16 16  Temp: 36.9 C 36.7 C  SpO2: 179% 150%    Complications: No apparent anesthesia complications

## 2020-11-13 NOTE — Plan of Care (Signed)
  Problem: Education: Goal: Knowledge of General Education information will improve Description Including pain rating scale, medication(s)/side effects and non-pharmacologic comfort measures Outcome: Progressing   

## 2020-11-13 NOTE — Anesthesia Procedure Notes (Signed)
Procedure Name: LMA Insertion Date/Time: 11/13/2020 7:06 PM Performed by: Gerald Leitz, CRNA Pre-anesthesia Checklist: Patient identified, Patient being monitored, Timeout performed, Emergency Drugs available and Suction available Patient Re-evaluated:Patient Re-evaluated prior to induction Oxygen Delivery Method: Circle system utilized Preoxygenation: Pre-oxygenation with 100% oxygen Induction Type: IV induction Ventilation: Mask ventilation without difficulty LMA: LMA with gastric port inserted LMA Size: 4.0 Tube type: Oral Number of attempts: 1 Placement Confirmation: positive ETCO2 and breath sounds checked- equal and bilateral Tube secured with: Tape Dental Injury: Teeth and Oropharynx as per pre-operative assessment

## 2020-11-13 NOTE — TOC Initial Note (Signed)
Transition of Care [facility]) - Initial/Assessment Note    Patient Details  Name: Daivd Fredericksen MRN: [id number] Date of Birth: 1955-07-24  Transition of Care [facility]) CM/SW Contact:    Dessa Phi, RN Phone Number: 11/13/2020, 10:42 AM  Clinical Narrative:  Noted may need long term iv abx-spoke to patient/dtr Ciana-has received iv abx in past-dtr will check on who they used before for HHRN-dtr is familiar with iv abx administration. [facility] already following for iv initial instruction, & med supply. Currently has peripheral line. Will check on [place] agency to provide HHRN-iv abx ongoing instruction,labs,picc flush per protocal.                 Expected Discharge Plan: [place] Barriers to Discharge: Continued Medical Work up   Patient Goals and CMS Choice Patient states their goals for this hospitalization and ongoing recovery are:: go home CMS Medicare.gov Compare Post Acute Care list provided to:: Patient Choice offered to / list presented to : Patient  Expected Discharge Plan and Services Expected Discharge Plan: [place]                                              Prior Living Arrangements/Services                       Activities of Daily Living Home Assistive Devices/Equipment: Cane (specify quad or straight), Walker (specify type) ADL Screening (condition at time of admission) Patient's cognitive ability adequate to safely complete daily activities?: Yes Is the patient deaf or have difficulty hearing?: No Does the patient have difficulty seeing, even when wearing glasses/contacts?: No Does the patient have difficulty concentrating, remembering, or making decisions?: No Patient able to express need for assistance with ADLs?: Yes Does the patient have difficulty dressing or bathing?: Yes Independently performs ADLs?: Yes (appropriate for developmental age) Does the patient have difficulty walking or climbing  stairs?: Yes Weakness of Legs: Both Weakness of Arms/Hands: None  Permission Sought/Granted                  Emotional Assessment              Admission diagnosis:  ESR raised [R70.0] Septic joint of right knee joint ([place]) [M00.9] Acute pain of right knee [M25.561] Anemia, unspecified type [D64.9] Patient Active Problem List   Diagnosis Date Noted   Septic joint of right knee joint ([place]) 11/08/2020   S/P right TKA reimplantation 08/02/2020   Infection of total left knee replacement ([place]) 03/20/2020   Cirrhosis of liver without ascites ([place])    AKI (acute kidney injury) ([place])    Bacteremia    Spinal stenosis of lumbar region without neurogenic claudication    Aspiration pneumonia ([place]) 09/24/2018   Abscess in epidural space of lumbar spine    MRSA bacteremia 09/21/2018   Infection of prosthetic right knee joint ([place]) 09/20/2018   Anemia of chronic disease 09/20/2018   Hypoalbuminemia 09/20/2018   Hypoglycemia without diagnosis of diabetes mellitus 09/20/2018   Epidural abscess 09/20/2018   Hyperkalemia 05/18/2018   Edentulous 05/11/2018   Pancytopenia ([place]) 05/10/2018   CAD (coronary artery disease) 05/10/2018   Hepatic encephalopathy ([place]) 05/10/2018   Alcohol abuse 05/10/2018   GERD (gastroesophageal reflux disease) 05/10/2018   Duodenal ulcer    Renal failure    Acute  on chronic anemia    Hypotension 06/17/2017   Hyponatremia 06/17/2017   Leg edema, right 67/20/9198   Acute metabolic encephalopathy 06/19/7979   Anemia, iron deficiency    Benign neoplasm of ascending colon    Hemorrhoids    Portal hypertensive gastropathy ([place])    Gastritis and gastroduodenitis    Esophageal varices without bleeding ([place])    H/O: CVA (cerebrovascular accident) 12/01/2013   ACS (acute coronary syndrome) ([place]) 12/01/2013   Anasarca 12/01/2013   Polysubstance abuse ([place]) 12/01/2013   CKD (chronic kidney disease) stage 3, GFR 30-59 ml/min ([place]) 12/01/2013   S/P right TKA  05/02/2013   Murmur 04/14/2013   Right inguinal hernia 12/26/2010   Cirrhosis, alcoholic ([place]) 02/54/8628   PCP:  [name], MD Pharmacy:   [facility] [place], [place] - [phone] [place] [address] [name] [address] Phone: [phone] Fax: [phone]  [facility] ([facility] - [place], [place] [address] Phone: [phone] Fax: [phone]     Social Determinants of Health (SDOH) Interventions    Readmission Risk Interventions Readmission Risk Prevention Plan 08/03/2020 10/03/2018  Transportation Screening Complete Complete  HRI or Home Care Consult Complete -  Social Work Consult for [place] Planning/Counseling Complete -  Palliative Care Screening Not Applicable -  Medication Review Press photographer) Complete Complete  PCP or Specialist appointment within 3-5 days of discharge - Complete  HRI or [place] - Not Complete  HRI or Home Care Consult Pt Refusal Comments - Pt stated he wanted to go home.  Did not discuss discharging with Home Health services.  SW Recovery Care/Counseling Consult - Complete  Palliative Care Screening - Not Applicable  Skilled Nursing Facility - Patient Refused  Some recent data might be hidden

## 2020-11-13 NOTE — Discharge Instructions (Addendum)
If you wish to obtain a different Primary Care Physician (as you requested), one option would be for you to contact the [facility] and [place] at [phone] and ask for the next available new patient appointment.     INSTRUCTIONS AFTER SURGERY  Remove items at home which could result in a fall. This includes throw rugs or furniture in walking pathways ICE to the affected joint every three hours while awake for 30 minutes at a time, for at least the first 3-5 days, and then as needed for pain and swelling.  Continue to use ice for pain and swelling. You may notice swelling that will progress down to the foot and ankle.  This is normal after surgery.  Elevate your leg when you are not up walking on it.   Continue to use the breathing machine you got in the hospital (incentive spirometer) which will help keep your temperature down.  It is common for your temperature to cycle up and down following surgery, especially at night when you are not up moving around and exerting yourself.  The breathing machine keeps your lungs expanded and your temperature down.   DIET:  As you were doing prior to hospitalization, we recommend a well-balanced diet.  DRESSING / WOUND CARE / SHOWERING  Keep the surgical dressing until follow up.  The dressing is water proof, so you can shower without any extra covering.  IF THE DRESSING FALLS OFF or the wound gets wet inside, change the dressing with sterile gauze.  Please use good hand washing techniques before changing the dressing.  Do not use any lotions or creams on the incision until instructed by your surgeon.    ACTIVITY  Increase activity slowly as tolerated, but follow the weight bearing instructions below.   No driving for 6 weeks or until further direction given by your physician.  You cannot drive while taking narcotics.  No lifting or carrying greater than 10 lbs. until further directed by your surgeon. Avoid periods of inactivity  such as sitting longer than an hour when not asleep. This helps prevent blood clots.  You may return to work once you are authorized by your doctor.     WEIGHT BEARING   Weight bearing as tolerated with assist device (walker, cane, etc) as directed, use it as long as suggested by your surgeon or therapist, typically at least 4-6 weeks.  CONSTIPATION  Constipation is defined medically as fewer than three stools per week and severe constipation as less than one stool per week.  Even if you have a regular bowel pattern at home, your normal regimen is likely to be disrupted due to multiple reasons following surgery.  Combination of anesthesia, postoperative narcotics, change in appetite and fluid intake all can affect your bowels.   YOU MUST use at least one of the following options; they are listed in order of increasing strength to get the job done.  They are all available over the counter, and you may need to use some, POSSIBLY even all of these options:    Drink plenty of fluids (prune juice may be helpful) and high fiber foods Colace 100 mg by mouth twice a day  Senokot for constipation as directed and as needed Dulcolax (bisacodyl), take with full glass of water  Miralax (polyethylene glycol) once or twice a day as needed.  If you have tried all these things and are unable to have a bowel movement in the first 3-4 days after surgery call  either your surgeon or your primary doctor.    If you experience loose stools or diarrhea, hold the medications until you stool forms back up.  If your symptoms do not get better within 1 week or if they get worse, check with your doctor.  If you experience "the worst abdominal pain ever" or develop nausea or vomiting, please contact the office immediately for further recommendations for treatment.   ITCHING:  If you experience itching with your medications, try taking only a single pain pill, or even half a pain pill at a time.  You can also use Benadryl  over the counter for itching or also to help with sleep.   TED HOSE STOCKINGS:  Use stockings on both legs until for at least 2 weeks or as directed by physician office. They may be removed at night for sleeping.  MEDICATIONS:  See your medication summary on the "After Visit Summary" that nursing will review with you.  You may have some home medications which will be placed on hold until you complete the course of blood thinner medication.  It is important for you to complete the blood thinner medication as prescribed.  PRECAUTIONS:  If you experience chest pain or shortness of breath - call 911 immediately for transfer to the hospital emergency department.   If you develop a fever greater that 101 F, purulent drainage from wound, increased redness or drainage from wound, foul odor from the wound/dressing, or calf pain - CONTACT YOUR SURGEON.                                                   FOLLOW-UP APPOINTMENTS:  If you do not already have a post-op appointment, please call the office for an appointment to be seen by your surgeon.  Guidelines for how soon to be seen are listed in your "After Visit Summary", but are typically between 1-4 weeks after surgery.  POST-OPERATIVE OPIOID TAPER INSTRUCTIONS: It is important to wean off of your opioid medication as soon as possible. If you do not need pain medication after your surgery it is ok to stop day one. Opioids include: Codeine, Hydrocodone(Norco, Vicodin), Oxycodone(Percocet, oxycontin) and hydromorphone amongst others.  Long term and even short term use of opiods can cause: Increased pain response Dependence Constipation Depression Respiratory depression And more.  Withdrawal symptoms can include Flu like symptoms Nausea, vomiting And more Techniques to manage these symptoms Hydrate well Eat regular healthy meals Stay active Use relaxation techniques(deep breathing, meditating, yoga) Do Not substitute Alcohol to help with  tapering If you have been on opioids for less than two weeks and do not have pain than it is ok to stop all together.  Plan to wean off of opioids This plan should start within one week post op of your joint replacement. Maintain the same interval or time between taking each dose and first decrease the dose.  Cut the total daily intake of opioids by one tablet each day Next start to increase the time between doses. The last dose that should be eliminated is the evening dose.   MAKE SURE YOU:  Understand these instructions.  Get help right away if you are not doing well or get worse.    Thank you for letting us be a part of your medical care team.  It is a privilege we respect  greatly.  We hope these instructions will help you stay on track for a fast and full recovery!

## 2020-11-13 NOTE — Anesthesia Procedure Notes (Signed)
Anesthesia Regional Block: Femoral nerve block   Pre-Anesthetic Checklist: , timeout performed,  Correct Patient, Correct Site, Correct Laterality,  Correct Procedure, Correct Position, site marked,  Risks and benefits discussed,  Pre-op evaluation,  At surgeon's request and post-op pain management  Laterality: Right  Prep: Maximum Sterile Barrier Precautions used, chloraprep       Needles:  Injection technique: Single-shot  Needle Type: Echogenic Stimulator Needle     Needle Length: 9cm  Needle Gauge: 22     Additional Needles:   Procedures:,,,, ultrasound used (permanent image in chart),,    Narrative:  Start time: 11/13/2020 9:57 PM End time: 11/13/2020 9:59 PM Injection made incrementally with aspirations every 5 mL.  Performed by: Personally  Anesthesiologist: Brennan Bailey, MD  Additional Notes: Risks, benefits, and alternative discussed. Patient gave consent for procedure. Patient prepped and draped in sterile fashion. Relevant anatomy identified with ultrasound guidance. Local anesthetic given in 5cc increments with no signs or symptoms of intravascular injection. No pain or paraesthesias with injection. Patient monitored throughout procedure with signs of LAST or immediate complications. Tolerated well. Ultrasound image placed in chart.  Tawny Asal, MD

## 2020-11-14 ENCOUNTER — Inpatient Hospital Stay (HOSPITAL_COMMUNITY): Payer: Medicare (Managed Care)

## 2020-11-14 ENCOUNTER — Encounter (HOSPITAL_COMMUNITY): Payer: Self-pay | Admitting: Orthopedic Surgery

## 2020-11-14 DIAGNOSIS — M00061 Staphylococcal arthritis, right knee: Secondary | ICD-10-CM | POA: Diagnosis not present

## 2020-11-14 LAB — COMPREHENSIVE METABOLIC PANEL
ALT: 9 U/L (ref 0–44)
AST: 22 U/L (ref 15–41)
Albumin: 1.9 g/dL — ABNORMAL LOW (ref 3.5–5.0)
Alkaline Phosphatase: 67 U/L (ref 38–126)
Anion gap: 5 (ref 5–15)
BUN: 20 mg/dL (ref 8–23)
CO2: 20 mmol/L — ABNORMAL LOW (ref 22–32)
Calcium: 7.6 mg/dL — ABNORMAL LOW (ref 8.9–10.3)
Chloride: 115 mmol/L — ABNORMAL HIGH (ref 98–111)
Creatinine, Ser: 1.33 mg/dL — ABNORMAL HIGH (ref 0.61–1.24)
GFR, Estimated: 59 mL/min — ABNORMAL LOW (ref >60)
Glucose, Bld: 108 mg/dL — ABNORMAL HIGH (ref 70–99)
Potassium: 3.8 mmol/L (ref 3.5–5.1)
Sodium: 140 mmol/L (ref 135–145)
Total Bilirubin: 0.6 mg/dL (ref 0.3–1.2)
Total Protein: 6.2 g/dL — ABNORMAL LOW (ref 6.5–8.1)

## 2020-11-14 LAB — CBC
HCT: 24.9 % — ABNORMAL LOW (ref 39.0–52.0)
Hemoglobin: 7.8 g/dL — ABNORMAL LOW (ref 13.0–17.0)
MCH: 31.1 pg (ref 26.0–34.0)
MCHC: 31.3 g/dL (ref 30.0–36.0)
MCV: 99.2 fL (ref 80.0–100.0)
Platelets: 146 10*3/uL — ABNORMAL LOW (ref 150–400)
RBC: 2.51 MIL/uL — ABNORMAL LOW (ref 4.22–5.81)
RDW: 16.2 % — ABNORMAL HIGH (ref 11.5–15.5)
WBC: 5.2 10*3/uL (ref 4.0–10.5)
nRBC: 0 % (ref 0.0–0.2)

## 2020-11-14 LAB — CK: Total CK: 16 U/L — ABNORMAL LOW (ref 49–397)

## 2020-11-14 MED ORDER — METHOCARBAMOL 500 MG PO TABS: 500.0000 mg | ORAL_TABLET | Freq: Four times a day (QID) | ORAL | 0 refills | Status: DC | PRN

## 2020-11-14 MED ORDER — OXYCODONE HCL 5 MG PO TABS: 5.0000 mg | ORAL_TABLET | ORAL | 0 refills | Status: DC | PRN

## 2020-11-14 MED ORDER — SODIUM CHLORIDE 0.9 % IV SOLN
500.0000 mg | Freq: Every day | INTRAVENOUS | Status: DC
  Administered 2020-11-14: 500 mg via INTRAVENOUS
  Filled 2020-11-14 (×2): qty 10

## 2020-11-14 MED ORDER — LIDOCAINE HCL 1 % IJ SOLN
INTRAMUSCULAR | Status: AC
  Filled 2020-11-14: qty 20

## 2020-11-14 MED ORDER — DAPTOMYCIN IV (FOR PTA / DISCHARGE USE ONLY): 500.0000 mg | INTRAVENOUS | 0 refills | Status: DC

## 2020-11-14 MED ORDER — LIDOCAINE HCL 1 % IJ SOLN
INTRAMUSCULAR | Status: DC | PRN
  Administered 2020-11-14: 5 mL

## 2020-11-14 MED ORDER — CHLORHEXIDINE GLUCONATE CLOTH 2 % EX PADS
6.0000 | MEDICATED_PAD | Freq: Every day | CUTANEOUS | Status: DC
  Administered 2020-11-14 – 2020-11-15 (×2): 6 via TOPICAL

## 2020-11-14 MED ORDER — ASPIRIN 81 MG PO CHEW: 81.0000 mg | CHEWABLE_TABLET | Freq: Two times a day (BID) | ORAL | 0 refills | Status: AC

## 2020-11-14 NOTE — Consult Note (Signed)
[facility] for Infectious Disease       Reason for Consult:prosthetic knee infection    Referring Physician: Dr. Alvan Dame  Principal Problem:   Septic joint of right knee joint [facility]) Active Problems:   Hyponatremia   Acute on chronic anemia   CAD (coronary artery disease)   Cirrhosis of liver without ascites ([name])    aspirin  81 mg Oral BID   Chlorhexidine Gluconate Cloth  6 each Topical Daily   docusate sodium  100 mg Oral BID   feeding supplement  237 mL Oral TID BM   ferrous sulfate  325 mg Oral TID PC   folic acid  1 mg Oral Daily   lidocaine       multivitamin with minerals  1 tablet Oral Daily   nadolol  10 mg Oral Daily   pantoprazole  40 mg Oral Daily   thiamine  100 mg Oral Daily    Recommendations:  Daptomycin for 6 weeks Picc line (already placed) OPAT Ok from ID standpoint for discharge  Assessment: He has a recurrence of his infection with the same MRSA and s/p debridement and will need 6 weeks of IV antibiotics.  Will again use daptomycin and will check daptomycin sensitivities.  This will be followed by continuation of suppressive doxycycline after treatment.  This was discussed with the patient including the importance of staying on the doxycycline, which he has stopped in the past.   Diagnosis: Recurrent prosthetic joint infection   Culture Result: MRSA  No Known Allergies  OPAT Orders Discharge antibiotics to be given via PICC line Discharge antibiotics: daptomycin 500 mg IV once daily Per pharmacy protocol yes Duration: 6 weeks End Date: 12/26/20  [facility] Care Per Protocol: yes  Home health RN for IV administration and teaching; PICC line care and labs.    Labs weekly while on IV antibiotics: _x_ CBC with differential __ BMP _x_ CMP __x CRP _x_ ESR __ Vancomycin trough _x_ CK  _x_ Please pull PIC at completion of IV antibiotics __ Please leave PIC in place until doctor has seen patient or been notified  Fax weekly labs to  [phone]  Clinic Follow Up Appt: 12/19/20  @  2:45 pm with Dr. Megan Salon  Antibiotics: Day 6 vancomycin  HPI: Mark [name] is a 65 y.o. male with a history of alcoholic cirrhosis, s/p right TKA in 2015 and multiple infections of his right total knee came in with a recurrence of his right prosthetic knee and culture with MRSA, doxcycline sensitive.  He underwent non-excisional debridement with polyethylene revision by Dr. Alvan Dame yesterday for recurrence of infection and culture from 11/08/20 positive for MRSA, similar to previous.  He was sent in on 7/14 with increased pain and swelling of his right knee and aspiration culture as above.     He has a history of group A Strep bacteremia in 2019, MSSA bacteremia in 2020 and MRSA bacteremia in May 2020.    Review of Systems:  Constitutional: negative for fevers and chills Gastrointestinal: negative for nausea and diarrhea Integument/breast: negative for rash All other systems reviewed and are negative    Past Medical History:  Diagnosis Date   Alcohol abuse    Anemia    Arthritis    Ascites    Cirrhosis ([name])    Coffee ground emesis    Dehydration 06/17/2017   Febrile illness    GERD (gastroesophageal reflux disease)    Heart murmur    History of blood  transfusion    Hyperlipidemia    Hypertension    Leg swelling    Myocardial infarction ([place]) 2012   Preop cardiovascular exam 04/14/2013   Sepsis ([place]) 06/17/2017   Septic shock ([place]) 06/18/2017   SIRS (systemic inflammatory response syndrome) ([place]) 07/11/2017   Stroke ([place]) 2012   no deficits   Thrombocytopenia ([name])     Social History   Tobacco Use   Smoking status: Former    Packs/day: 0.50    Years: 10.00    Pack years: 5.00    Types: Cigarettes    Quit date: 01/05/2016    Years since quitting: 4.8   Smokeless tobacco: Never   Tobacco comments:    4 cigarettes a day  Vaping Use   Vaping Use: Never used  Substance Use Topics   Alcohol use: Yes     Comment: 2-3 beers/day   Drug use: Yes    Types: Marijuana    Comment: last smoked marijuana 10-22-20    Family History  Problem Relation Age of Onset   Hypertension Father    Diabetes Father    Dementia Mother    Lupus Sister     No Known Allergies  Physical Exam: Constitutional: in no apparent distress  Vitals:   11/14/20 0458 11/14/20 0827  BP: (!) 105/49 (!) 109/45  Pulse: (!) 57 65  Resp: 18 18  Temp: 98.1 F (36.7 C) 98.5 F (36.9 C)  SpO2: 100% 100%   EYES: anicteric Cardiovascular: Cor RRR Respiratory: clear; Musculoskeletal: no pedal edema noted Skin: negatives: no rash Neuro: non-focal  Lab Results  Component Value Date   WBC 5.2 11/14/2020   HGB 7.8 (L) 11/14/2020   HCT 24.9 (L) 11/14/2020   MCV 99.2 11/14/2020   PLT 146 (L) 11/14/2020    Lab Results  Component Value Date   CREATININE 1.33 (H) 11/14/2020   BUN 20 11/14/2020   NA 140 11/14/2020   K 3.8 11/14/2020   CL 115 (H) 11/14/2020   CO2 20 (L) 11/14/2020    Lab Results  Component Value Date   ALT 9 11/14/2020   AST 22 11/14/2020   ALKPHOS 67 11/14/2020     Microbiology: Recent Results (from the past 240 hour(s))  Blood culture (routine x 2)     Status: None   Collection Time: 11/08/20  6:15 PM   Specimen: BLOOD LEFT ARM  Result Value Ref Range Status   Specimen Description BLOOD LEFT ARM  Final   Special Requests   Final    BOTTLES DRAWN AEROBIC AND ANAEROBIC Blood Culture adequate volume   Culture   Final    NO GROWTH 5 DAYS Performed at [facility], [address], [place], [place] [address]    Report Status 11/13/2020 FINAL  Final  Resp Panel by RT-PCR (Flu A&B, Covid) Nasopharyngeal Swab     Status: None   Collection Time: 11/08/20  9:03 PM   Specimen: Nasopharyngeal Swab; Nasopharyngeal(NP) swabs in vial transport medium  Result Value Ref Range Status   SARS Coronavirus 2 by RT PCR NEGATIVE NEGATIVE Final    Comment: (NOTE) SARS-CoV-2 target nucleic acids are  NOT DETECTED.  The SARS-CoV-2 RNA is generally detectable in upper respiratory specimens during the acute phase of infection. The lowest concentration of SARS-CoV-2 viral copies this assay can detect is 138 copies/mL. A negative result does not preclude SARS-Cov-2 infection and should not be used as the sole basis for treatment or other patient management decisions. A negative  result may occur with  improper specimen collection/handling, submission of specimen other than nasopharyngeal swab, presence of viral mutation(s) within the areas targeted by this assay, and inadequate number of viral copies(<138 copies/mL). A negative result must be combined with clinical observations, patient history, and epidemiological information. The expected result is Negative.  Fact Sheet for Patients:  EntrepreneurPulse.com.au  Fact Sheet for Healthcare Providers:  IncredibleEmployment.be  This test is no t yet approved or cleared by the [place] FDA and  has been authorized for detection and/or diagnosis of SARS-CoV-2 by FDA under an Emergency Use Authorization (EUA). This EUA will remain  in effect (meaning this test can be used) for the duration of the COVID-19 declaration under Section 564(b)(1) of the Act, 21 U.S.C.section 360bbb-3(b)(1), unless the authorization is terminated  or revoked sooner.       Influenza A by PCR NEGATIVE NEGATIVE Final   Influenza B by PCR NEGATIVE NEGATIVE Final    Comment: (NOTE) The Xpert Xpress SARS-CoV-2/FLU/RSV plus assay is intended as an aid in the diagnosis of influenza from Nasopharyngeal swab specimens and should not be used as a sole basis for treatment. Nasal washings and aspirates are unacceptable for Xpert Xpress SARS-CoV-2/FLU/RSV testing.  Fact Sheet for Patients: EntrepreneurPulse.com.au  Fact Sheet for Healthcare Providers: IncredibleEmployment.be  This test is not  yet approved or cleared by the [place] FDA and has been authorized for detection and/or diagnosis of SARS-CoV-2 by FDA under an Emergency Use Authorization (EUA). This EUA will remain in effect (meaning this test can be used) for the duration of the COVID-19 declaration under Section 564(b)(1) of the Act, 21 U.S.C. section 360bbb-3(b)(1), unless the authorization is terminated or revoked.  Performed at [facility], [place] [address] [place], [place] [address]   Body fluid culture w Gram Stain     Status: None (Preliminary result)   Collection Time: 11/08/20 10:09 PM   Specimen: KNEE; Body Fluid  Result Value Ref Range Status   Specimen Description KNEE  Final   Special Requests NONE  Final   Gram Stain   Final    ABUNDANT WBC PRESENT, PREDOMINANTLY PMN RARE GRAM POSITIVE COCCI IN PAIRS    Culture   Final    RARE METHICILLIN RESISTANT STAPHYLOCOCCUS AUREUS CULTURE REINCUBATED FOR BETTER GROWTH Performed at [facility], [place] [address] [place], [place] [address]    Report Status PENDING  Incomplete   Organism ID, Bacteria METHICILLIN RESISTANT STAPHYLOCOCCUS AUREUS  Final      Susceptibility   Methicillin resistant staphylococcus aureus - MIC*    CIPROFLOXACIN <=0.5 SENSITIVE Sensitive     ERYTHROMYCIN >=8 RESISTANT Resistant     GENTAMICIN <=0.5 SENSITIVE Sensitive     OXACILLIN >=4 RESISTANT Resistant     TETRACYCLINE <=1 SENSITIVE Sensitive     VANCOMYCIN 1 SENSITIVE Sensitive     TRIMETH/SULFA <=10 SENSITIVE Sensitive     CLINDAMYCIN <=0.25 SENSITIVE Sensitive     RIFAMPIN <=0.5 SENSITIVE Sensitive     Inducible Clindamycin NEGATIVE Sensitive     * RARE METHICILLIN RESISTANT STAPHYLOCOCCUS AUREUS  Blood culture (routine x 2)     Status: None   Collection Time: 11/08/20 10:23 PM   Specimen: BLOOD RIGHT FOREARM  Result Value Ref Range Status   Specimen Description BLOOD RIGHT FOREARM  Final   Special Requests   Final    BOTTLES DRAWN AEROBIC AND  ANAEROBIC Blood Culture adequate volume   Culture   Final    NO GROWTH  5 DAYS Performed at [facility], [place] [address], [place], [place] [address]    Report Status 11/13/2020 FINAL  Final  Surgical pcr screen     Status: None   Collection Time: 11/13/20  3:40 AM   Specimen: Nasal Mucosa; Nasal Swab  Result Value Ref Range Status   MRSA, PCR NEGATIVE NEGATIVE Final   Staphylococcus aureus NEGATIVE NEGATIVE Final    Comment: (NOTE) The Xpert SA Assay (FDA approved for NASAL specimens in patients 61 years of age and older), is one component of a comprehensive surveillance program. It is not intended to diagnose infection nor to guide or monitor treatment. Performed at [facility], [place] [address], [place], [place] [address]     Amoree Newlon W Demitra Danley, MD [facility] for Infectious Disease [facility] [email or web address] 11/14/2020, 10:53 AM

## 2020-11-14 NOTE — Progress Notes (Signed)
Pharmacy Antibiotic Note  Mark [name] is a 65 y.o. male admitted on 11/08/2020 with right knee pain s/p TKA in April 2022. Pt is on lifelong doxy, but ran out a few weeks ago. Pharmacy has been consulted for vancomycin dosing for septic knee joint.  Pt underwent I&D with revision of right knee on 7/19  11/14/20 10:04 AM  WBC WNL SCr 1.33, CrCl ~42 mL/min. SCr trended up slightly Afebrile Total body weight << ideal body weight   Plan: Continue vancomycin 1000 mg IV q24h for goal AUC 400-550 Monitor renal function Anticipate prolonged IV antibiotic course, will plan to check levels at steady state  Height: 5\' 7"  (170.2 cm) Weight: 56 kg (123 lb 7.3 oz) IBW/kg (Calculated) : 66.1  Temp (24hrs), Avg:97.9 F (36.6 C), Min:97.2 F (36.2 C), Max:98.5 F (36.9 C)  Recent Labs  Lab 11/08/20 1803 11/09/20 0513 11/10/20 0438 11/11/20 0534 11/12/20 0405 11/14/20 0406  WBC 3.3* 2.7* 2.9* 3.3* 5.6 5.2  CREATININE 1.18 1.09 1.45* 1.18 1.18 1.33*  LATICACIDVEN 1.7  --   --   --   --   --      Estimated Creatinine Clearance: 42.7 mL/min (A) (by C-G formula based on SCr of 1.33 mg/dL (H)).    No Known Allergies  Antimicrobials this admission: 7/15 Vancomycin >>  7/15 Zosyn >> 7/16 7/16 cefepime >> 7/18  Microbiology results: 7/14 BCx: ngF Knee body fluid 7/14: rare MRSA  Thank you for allowing pharmacy to be a part of this patient's care.  Lenis Noon, PharmD 11/14/2020 10:04 AM

## 2020-11-14 NOTE — TOC Progression Note (Signed)
Transition of Care [facility]) - Progression Note    Patient Details  Name: Dymir Neeson MRN: [id number] Date of Birth: 06/06/1955  Transition of Care [facility]) CM/SW Contact  Ayane Delancey, Juliann Pulse, RN Phone Number: 11/14/2020, 11:43 AM  Clinical Narrative: Per prior note on suspicions of drugs-[employer] rep Pam notified-await further d/c plans per MD/ID.      Expected Discharge Plan: Home/Self Care Barriers to Discharge: Continued Medical Work up  Expected Discharge Plan and Services Expected Discharge Plan: Home/Self Care                                               Social Determinants of Health (SDOH) Interventions    Readmission Risk Interventions Readmission Risk Prevention Plan 08/03/2020 10/03/2018  Transportation Screening Complete Complete  HRI or Home Care Consult Complete -  Social Work Consult for [place] Planning/Counseling Complete -  Palliative Care Screening Not Applicable -  Medication Review Press photographer) Complete Complete  PCP or Specialist appointment within 3-5 days of discharge - Complete  HRI or [place] - Not Complete  HRI or Home Care Consult Pt Refusal Comments - Pt stated he wanted to go home.  Did not discuss discharging with Home Health services.  SW Recovery Care/Counseling Consult - Complete  Palliative Care Screening - Not Applicable  Skilled Nursing Facility - Patient Refused  Some recent data might be hidden

## 2020-11-14 NOTE — Evaluation (Signed)
Physical Therapy Evaluation Patient Details Name: Mark [name] MRN: [id number] DOB: 30-May-1955 Today's Date: 11/14/2020   History of Present Illness  Pt is 65 yo male admitted with recurrent R TKA revision infection on 11/08/20.  He is s/p open excisional and non-excisional debridement of R knee w polyethylene revision on 11/13/20.  Pt had R TKA in 2015 which became infected and removed in 11/21 with antibiotic spacer placement.  In 4/22 he underwent revision of R TKA and was on lifelong doxycycline but ran out 3 weeks prior to admission and R knee developed pain and edema.  Pt has hx of ETOH and drug abuse, arthritis, HTN, MI, and multiple sx on R knee.  Clinical Impression  Pt admitted with above diagnosis. At baseline , pt is independent without AD and lives with his wife.  He is well equipped with DME including RW, BSC, and shower seat and cane.  Today, pt reports pain at "200" but showed no major signs of severe pain and was able to ambulate 30' in room with min guard.  He was somewhat impulsive and needed cues for safety.  Pt easily irritated and expressing wants to go home. He also demonstrated R LE weakness with poor quad contraction - noted pt did have femoral nerve block with surgery yesterday that is potentially contributing.  He did compensate with UE with ambulation.  Pt currently with functional limitations due to the deficits listed below (see PT Problem List). Pt will benefit from skilled PT to increase their independence and safety with mobility to allow discharge to the venue listed below.       Follow Up Recommendations Outpatient PT;Supervision for mobility/OOB    Equipment Recommendations  None recommended by PT    Recommendations for Other Services       Precautions / Restrictions Precautions Precautions: Fall Restrictions Other Position/Activity Restrictions: WBAT      Mobility  Bed Mobility Overal bed mobility: Needs Assistance Bed Mobility: Supine to  Sit;Sit to Supine     Supine to sit: Supervision Sit to supine: Supervision   General bed mobility comments: Used UE to assist R LE    Transfers Overall transfer level: Needs assistance Equipment used: Rolling walker (2 wheeled) Transfers: Sit to/from Stand Sit to Stand: Min guard         General transfer comment: Cues for safety  Ambulation/Gait Ambulation/Gait assistance: Min guard Gait Distance (Feet): 30 Feet Assistive device: Rolling walker (2 wheeled) Gait Pattern/deviations: Step-to pattern;Decreased stride length;Decreased stance time - right;Decreased weight shift to right Gait velocity: decreased   General Gait Details: Ambulated around room with min guard and cues for safety with R LE weakness.  Pt somewhat impulsive.  Pt with heavy reliance on UE during R stance due to weakness.  Stairs            Wheelchair Mobility    Modified Rankin (Stroke Patients Only)       Balance Overall balance assessment: Needs assistance Sitting-balance support: No upper extremity supported Sitting balance-[name]: Good     Standing balance support: Bilateral upper extremity supported Standing balance-[name]: Poor Standing balance comment: requiring RW but was stable with RW                             Pertinent Vitals/Pain Pain Assessment: 0-10 Pain Score:  ("200") Pain Location: R knee Pain Descriptors / Indicators: Discomfort Pain Intervention(s): Limited activity within patient's tolerance;Monitored during session;Premedicated before  session;Ice applied (Pt reports pain a "200".  He had flat affect but no other signs of pain)    Home Living Family/patient expects to be discharged to:: Private residence Living Arrangements: Spouse/significant other Available Help at Discharge: Family;Available 24 hours/day Type of Home: House Home Access: Stairs to enter Entrance Stairs-Rails: None Entrance Stairs-Number of Steps: 2 Home Layout: One  level Home Equipment: Walker - 2 wheels;Bedside commode;Grab bars - tub/shower;Shower seat      Prior Function     Gait / Transfers Assistance Needed: Since TKA pt had returned to community ambulation without AD  ADL's / Homemaking Assistance Needed: Reports independent with ADLs , driving, and IADLs        Hand Dominance        Extremity/Trunk Assessment   Upper Extremity Assessment Upper Extremity Assessment: Overall WFL for tasks assessed    Lower Extremity Assessment Lower Extremity Assessment: LLE deficits/detail;RLE deficits/detail RLE Deficits / Details: ROM: knee lacks 5 ext, 80 flexion; MMT: ankle 4/5, knee 1/5, hip 2/5; did not pt had femoral nerve block with surgery yesterday evening RLE Sensation: WNL LLE Deficits / Details: ROM WFL; MMT 5/5 LLE Sensation: WNL       Communication   Communication: No difficulties  Cognition Arousal/Alertness: Awake/alert Behavior During Therapy: Flat affect Overall Cognitive Status: Within Functional Limits for tasks assessed                                        General Comments General comments (skin integrity, edema, etc.): VSS; encouraged to perform ankle pumps and quad sets as able.  Pt with c/o just feeling weak.  Also, c/o not being able to lift/move R LE.  Educated just had surgery yesterday and also noted femoral nerve block that could still be contributing to weakness.    Exercises Total Joint Exercises Ankle Circles/Pumps: AROM;Both;5 reps;Supine Quad Sets: AROM;Both;5 reps;Supine   Assessment/Plan    PT Assessment Patient needs continued PT services  PT Problem List Decreased strength;Decreased mobility;Decreased safety awareness;Decreased range of motion;Decreased knowledge of precautions;Decreased activity tolerance;Decreased balance;Decreased knowledge of use of DME;Pain       PT Treatment Interventions DME instruction;Therapeutic activities;Gait training;Therapeutic  exercise;Patient/family education;Modalities;Stair training;Functional mobility training;Neuromuscular re-education    PT Goals (Current goals can be found in the Care Plan section)  Acute Rehab PT Goals Patient Stated Goal: return home; decrease pain PT Goal Formulation: With patient Time For Goal Achievement: 11/28/20 Potential to Achieve Goals: Good    Frequency Min 5X/week   Barriers to discharge        Co-evaluation               AM-PAC PT "6 Clicks" Mobility  Outcome Measure Help needed turning from your back to your side while in a flat bed without using bedrails?: A Little Help needed moving from lying on your back to sitting on the side of a flat bed without using bedrails?: A Little Help needed moving to and from a bed to a chair (including a wheelchair)?: A Little Help needed standing up from a chair using your arms (e.g., wheelchair or bedside chair)?: A Little Help needed to walk in hospital room?: A Little Help needed climbing 3-5 steps with a railing? : A Lot 6 Click Score: 17    End of Session Equipment Utilized During Treatment: Gait belt Activity Tolerance: Patient limited by pain Patient left: in bed;with call  bell/phone within reach;with bed alarm set Nurse Communication: Mobility status PT Visit Diagnosis: Other abnormalities of gait and mobility (R26.89);Muscle weakness (generalized) (M62.81)    Time: 2241-1464 PT Time Calculation (min) (ACUTE ONLY): 24 min   Charges:   PT Evaluation $PT Eval Low Complexity: 1 Low PT Treatments $Gait Training: 8-22 mins        Abran Richard, PT Acute Rehab Services Pager [phone] [facility] [place] 11/14/2020, 11:52 AM

## 2020-11-14 NOTE — Progress Notes (Addendum)
At 0200 NT reported to CN patient had some suspicious  white substance in a container (pill crusher) he removed from his pants pocket. CN immediately went to patient bedside to assess situation and observed the same thing. Patient explained that his wife brought him an Oxycodone to treat his pain. CN explained this was not allowed and removed the powdered substances from the container. Educated the importance and risk of taking additional medications at the bed side in addition to PRN pain medication the doctor prescribes. CN rinsed container out and gave container back per patient request.  Made house supervisor aware . House supervisor came to bedside and discussed alternatives of care if this happens again. Patient agreed to follow rules per [facility] supervisor. Will cont to monitor.

## 2020-11-14 NOTE — Op Note (Signed)
NAME: CODA, MATHEY MEDICAL RECORD NO: [id number] ACCOUNT NO: [id number] DATE OF BIRTH: 05-08-1955 FACILITY: [facility] LOCATION: [id number] PHYSICIAN: Pietro Cassis. Alvan Dame, MD  Operative Report   DATE OF PROCEDURE: 11/13/2020  PREOPERATIVE DIAGNOSIS:  Recurrent infection of right total knee revision for infection.  POSTOPERATIVE DIAGNOSIS:  Recurrent infection of right total knee revision for infection.  PROCEDURE:  Open excisional and non-excisional debridement of right knee with polyethylene revision.  COMPONENTS USED:  A DePuy size 5 mm posterior stabilized insert to match the 6 femur.  SURGEON:  Pietro Cassis. Alvan Dame, MD.  ASSISTANT:  Costella Hatcher, PA-C.  Note that Ms. Lu Duffel was present for the entirety of the case in preoperative position.  Perioperative management of the operative extremity, general facilitation of the case, primary wound closure.  ANESTHESIA:  General.  BLOOD LOSS:  Less than 50 mL due to the fact the tourniquet was up for the entirety of the case.  TOURNIQUET TIME:  59 minutes at 250 mmHg.  DRAINS:  None.  COMPLICATIONS:  None apparent.  INDICATIONS:  The patient is a 65 year old male with a complex history involving his right knee.  He was noted to have infection of his right knee over the past year.  He underwent a 2-stage treatment of this with a resection.  He had an extended period  of time prior to placement of his new knee with no signs clinically of infection prior to reimplantation.  He underwent reimplantation and made through the recovery.  He was on suppressive antibiotics, but apparently was unable to get access to  doxycycline.  He had increasing pain in his right knee and was subsequently admitted to the hospital on 11/08/2020 where knee aspiration revealed concerns for recurrent Staphylococcus infection with MRSA.  Given this new finding as well as the complexity  of the revised components in his knee, I feel that it is in his best interest at this point  to try to salvage his joint, though the risks of recurrence are quite significant related to medical issues associated with his hepatitis as well as cirrhosis.   We discussed the necessity of doing a procedure to try to debulk the infection as well as to start treatment again.  Consent was obtained for management of infection.  DESCRIPTION OF PROCEDURE:  The patient was brought to the operative theater.  Once adequate anesthesia, preoperative antibiotics had already been given during his hospitalization.  He was positioned supine with a right thigh tourniquet placed.  The right  lower extremity was then prepped and draped in sterile fashion.  A timeout was performed identifying the patient, planned procedure, and extremity.  His old incision was utilized.  Soft tissue plane was created.  A median arthrotomy was then made  encountering no active purulence, but fluid inside the knee.  Following an extensive excisional debridement of nonviable tissue and scar tissue with the use of the Bovie, we removed the old polyethylene insert to allow for further debridement  posteriorly.  Once this was carried out, we irrigated the knee with 3 liters of normal saline solution initially followed by Betadine solution and IrriSept, chlorhexidine base solution.  Once this was done, we changed our surgical gloves and opened up  the new implant.  We placed a new implant into the knee.  We then irrigated the knee again with normal saline solution prior to closure.  The extensor mechanism was reapproximated using #1 Vicryl and #1 Stratafix suture.  The remainder of the wound was  closed  with 2-0 Vicryl and a running Monocryl stitch.  The knee was clean, dry and dressed sterilely using surgical glue and Aquacel dressing.  The patient was then brought to the recovery room in stable condition tolerating the procedure well.  Postoperatively, he will need antibiotic treatment.  We will discuss this with Infectious Disease for  management purposes.  PICC line has been ordered.     Elián.Darby D: 11/14/2020 7:39:58 am T: 11/14/2020 9:34:00 am  JOB: [id number]/ [id number]

## 2020-11-14 NOTE — Progress Notes (Signed)
Patient ID: Mark [name], male   DOB: 1956-04-11, 65 y.o.   MRN: [id number] Subjective: 1 Day Post-Op Procedure(s) (LRB): IRRIGATION AND DEBRIDEMENT KNEE (Right)    Patient reports pain as moderate. Events from post op and am noted  Objective:   VITALS:   Vitals:   11/13/20 2225 11/14/20 0458  BP: (!) 154/72 (!) 105/49  Pulse: (!) 48 (!) 57  Resp: 12 18  Temp:  98.1 F (36.7 C)  SpO2: 100% 100%    Neurologically intact Incision: dressing C/D/I  LABS Recent Labs    11/12/20 0405 11/14/20 0406  HGB 9.1* 7.8*  HCT 28.1* 24.9*  WBC 5.6 5.2  PLT 199 146*    Recent Labs    11/12/20 0405 11/14/20 0406  NA 142 140  K 4.0 3.8  BUN 17 20  CREATININE 1.18 1.33*  GLUCOSE 82 108*    No results for input(s): LABPT, INR in the last 72 hours.   Assessment/Plan: 1 Day Post-Op Procedure(s) (LRB): IRRIGATION AND DEBRIDEMENT KNEE (Right)   Advance diet Up with therapy  PIC line ordered ID consult made to assist with management of this complex problem with goal of joint salvage and preservation due to excessive morbidity associated with resection of hie revision total knee components

## 2020-11-14 NOTE — Progress Notes (Signed)
PHARMACY CONSULT NOTE FOR:  OUTPATIENT  PARENTERAL ANTIBIOTIC THERAPY (OPAT)  Indication: Prosthetic knee infection Regimen: Daptomycin 500 mg Q24H End date: 12/26/20  IV antibiotic discharge orders are pended. To discharging provider:  please sign these orders via discharge navigator,  Select New Orders & click on the button choice - Manage This Unsigned Work.    Thank you for allowing pharmacy to be a part of this patient's care.  Faustino Congress, PharmD Candidate 11/14/2020, 10:57 AM

## 2020-11-14 NOTE — Progress Notes (Addendum)
Mark [name]  TDV:[id number] DOB: 1956-01-28 DOA: 11/08/2020 PCP: [name], MD    Brief Narrative:  [phone] with a history of alcoholic liver cirrhosis and chronic anemia who presented to the ED with increasing pain and swelling of his right knee.  The patient underwent a right total knee arthroplasty in 9485 which was complicated by an infection and required explantation November 2021 with placement of an antibiotic spacer and long-term antibiotic treatment.  In April 2022 he underwent replacement of his total knee.  He was prescribed lifelong doxycycline at the direction of the Infectious Disease service but ran out approximately 3 weeks prior to this admission.  Orthopedics evaluated the patient in the ED and performed an arthrocentesis.  Arrangements were then made for him to be transferred from [facility] ED to [facility] ED for planned orthopedic procedure.  He was also found to be significantly anemic and therefore was transfused while still in the ED at [facility].  Significant Events:  2015 R knee TKA 02/2020 removal TKA - abx spacer placed 4/22 redo TKA 7/14 admit via [place] w/ recurrent septic R knee 7/14 arthrocentesis right knee in the ED 7/14 1 U PRBC 7/15 1 U PRBC 7/19 I&D R knee w/ polyethylene revision   Consultants:  Orthopedic Surgery  Code Status: NO CODE BLUE  Antimicrobials:  Vancomycin 7/15 > Zosyn 7/15 Cefepime 7/16 > 7/18  DVT prophylaxis: SCDs  Subjective: Patient was found to be in possession of a "white powder" earlier this morning, which was reportedly thought to be a crushed narcotic pill (it was in a pill crusher).  Patient has been informed that this is against hospital policy and agrees to stop doing so. Of note he does have a hx of polysubstance abuse.   Assessment & Plan:  Recurrent MRSA septic right knee TKA Patient was noncompliant with prescribed lifelong doxycycline suppression therapy - care per Orthopedic Surgery - s/p I&D  7/19 - ID to see for abx recs - PICC to be placed per IR   Need for home IV abx tx This is a complicated situation given the patient's history of polysubstance abuse -please see Dr. Lucianne Lei Dam's note dated 03/21/2020 and Dr. Alvan Dame 03/22/2020 when the same situation was encountered - there has been suspicion of IV drug use in the past but this has not been definitively confirmed and the patient has previously vehemently denied it, admitting at times that he "snorts heroin" and does not inject it as he has a fear of needles -as per the referenced notes patient has been successfully treated with IV antibiotics at home previously - we can investigate the possibility of a "PICC lock" prior to his discharge  Transient SVT Appreciated while in ED in setting of DH/anemia - asymptomatic  Acute on chronic normocytic anemia - anemia of chronic disease Due to alcoholism as well as smoldering infection and poor nutrition - no evidence of gross bleeding - hemoglobin 5.7 on admission - does have a known history of ulcerative duodenitis noted on EGD 2019 - denies any clear GI bleeding - transfused a total of 2 units PRBC thus far - transfuse further if Hgb drops below 7.0   Recent Labs  Lab 11/09/20 0513 11/10/20 0438 11/11/20 0534 11/12/20 0405 11/14/20 0406  HGB 7.1* 6.8* 8.4* 9.1* 7.8*     Acute kidney injury (not POA) Creatinine climbed somewhat following admission but improved again with volume expansion and transfusion - cont to follow   Recent Labs  Lab 11/09/20 0513 11/10/20 0438 11/11/20 0534 11/12/20 0405 11/14/20 0406  CREATININE 1.09 1.45* 1.18 1.18 1.33*     Leukopenia HIV negative -likely due to bone marrow suppression in setting of alcoholism - WBC has normalized   Alcoholism - cirrhosis of the liver Continue usual home medical therapies - no evidence of alcohol withdrawal thus far - f/u ammonia in AM  CAD Aspirin - asymptomatic  Hyponatremia Due to alcoholism, cirrhosis, as  well as volume depletion - resolved with volume expansion    Family Communication: no family present at time of exam  Status is: Inpatient  Remains inpatient appropriate because:Inpatient level of care appropriate due to severity of illness  Dispo: The patient is from: Home              Anticipated d/c is to:  unclear              Patient currently is not medically stable to d/c.   Difficult to place patient No  Objective: Blood pressure (!) 105/49, pulse (!) 57, temperature 98.1 F (36.7 C), temperature source Oral, resp. rate 18, height 5\' 7"  (1.702 m), weight 56 kg, SpO2 100 %.  Intake/Output Summary (Last 24 hours) at 11/14/2020 0742 Last data filed at 11/14/2020 0600 Gross per 24 hour  Intake 1177.29 ml  Output 350 ml  Net 827.29 ml    Filed Weights   11/09/20 0529  Weight: 56 kg    Examination: General: No acute respiratory distress Lungs: CTA B without wheezing Cardiovascular: RRR without murmur Abdomen: Nontender, nondistended, soft, bowel sounds positive, no rebound Extremities: Right knee dressed and dry -no significant edema left lower extremity   CBC: Recent Labs  Lab 11/08/20 1803 11/09/20 0513 11/11/20 0534 11/12/20 0405 11/14/20 0406  WBC 3.3*   < > 3.3* 5.6 5.2  NEUTROABS 1.7  --   --   --   --   HGB 5.7*   < > 8.4* 9.1* 7.8*  HCT 17.8*   < > 25.6* 28.1* 24.9*  MCV 97.3   < > 94.5 96.2 99.2  PLT 223   < > 183 199 146*   < > = values in this interval not displayed.    Basic Metabolic Panel: Recent Labs  Lab 11/11/20 0534 11/12/20 0405 11/14/20 0406  NA 139 142 140  K 3.8 4.0 3.8  CL 111 114* 115*  CO2 21* 21* 20*  GLUCOSE 73 82 108*  BUN 18 17 20   CREATININE 1.18 1.18 1.33*  CALCIUM 8.1* 8.4* 7.6*  MG 1.9  --   --   PHOS 2.9  --   --     GFR: Estimated Creatinine Clearance: 42.7 mL/min (A) (by C-G formula based on SCr of 1.33 mg/dL (H)).  Liver Function Tests: Recent Labs  Lab 11/08/20 1803 11/11/20 0534 11/14/20 0406   AST 23 20 22   ALT 9 9 9   ALKPHOS 99 77 67  BILITOT 0.5 0.9 0.6  PROT 7.8 7.1 6.2*  ALBUMIN 2.2* 2.2* 1.9*      HbA1C: Hgb A1c MFr Bld  Date/Time Value Ref Range Status  11/01/2009 04:55 PM  <5.7 % Final   5.1 (NOTE)  According to the ADA Clinical Practice Recommendations for 2011, when HbA1c is used as a screening test:   >=6.5%   Diagnostic of Diabetes Mellitus           (if abnormal result  is confirmed)  5.7-6.4%   Increased risk of developing Diabetes Mellitus  References:Diagnosis and Classification of Diabetes Mellitus,Diabetes CHEN,2778,24(MPNTI 1):S62-S69 and Standards of Medical Care in         Diabetes - 2011,Diabetes RWER,1540,08  (Suppl 1):S11-S61.    Recent Results (from the past 240 hour(s))  Blood culture (routine x 2)     Status: None   Collection Time: 11/08/20  6:15 PM   Specimen: BLOOD LEFT ARM  Result Value Ref Range Status   Specimen Description BLOOD LEFT ARM  Final   Special Requests   Final    BOTTLES DRAWN AEROBIC AND ANAEROBIC Blood Culture adequate volume   Culture   Final    NO GROWTH 5 DAYS Performed at [facility], [address] [place], [place] [address]    Report Status 11/13/2020 FINAL  Final  Resp Panel by RT-PCR (Flu A&B, Covid) Nasopharyngeal Swab     Status: None   Collection Time: 11/08/20  9:03 PM   Specimen: Nasopharyngeal Swab; Nasopharyngeal(NP) swabs in vial transport medium  Result Value Ref Range Status   SARS Coronavirus 2 by RT PCR NEGATIVE NEGATIVE Final    Comment: (NOTE) SARS-CoV-2 target nucleic acids are NOT DETECTED.  The SARS-CoV-2 RNA is generally detectable in upper respiratory specimens during the acute phase of infection. The lowest concentration of SARS-CoV-2 viral copies this assay can detect is 138 copies/mL. A negative result does not preclude SARS-Cov-2 infection and should not be used as the sole basis for treatment or other  patient management decisions. A negative result may occur with  improper specimen collection/handling, submission of specimen other than nasopharyngeal swab, presence of viral mutation(s) within the areas targeted by this assay, and inadequate number of viral copies(<138 copies/mL). A negative result must be combined with clinical observations, patient history, and epidemiological information. The expected result is Negative.  Fact Sheet for Patients:  EntrepreneurPulse.com.au  Fact Sheet for Healthcare Providers:  IncredibleEmployment.be  This test is no t yet approved or cleared by the [place] FDA and  has been authorized for detection and/or diagnosis of SARS-CoV-2 by FDA under an Emergency Use Authorization (EUA). This EUA will remain  in effect (meaning this test can be used) for the duration of the COVID-19 declaration under Section 564(b)(1) of the Act, 21 U.S.C.section 360bbb-3(b)(1), unless the authorization is terminated  or revoked sooner.       Influenza A by PCR NEGATIVE NEGATIVE Final   Influenza B by PCR NEGATIVE NEGATIVE Final    Comment: (NOTE) The Xpert Xpress SARS-CoV-2/FLU/RSV plus assay is intended as an aid in the diagnosis of influenza from Nasopharyngeal swab specimens and should not be used as a sole basis for treatment. Nasal washings and aspirates are unacceptable for Xpert Xpress SARS-CoV-2/FLU/RSV testing.  Fact Sheet for Patients: EntrepreneurPulse.com.au  Fact Sheet for Healthcare Providers: IncredibleEmployment.be  This test is not yet approved or cleared by the [place] FDA and has been authorized for detection and/or diagnosis of SARS-CoV-2 by FDA under an Emergency Use Authorization (EUA). This EUA will remain in effect (meaning this test can be used) for the duration of the COVID-19 declaration under Section 564(b)(1) of the Act, 21 U.S.C. section  360bbb-3(b)(1), unless the authorization is terminated or revoked.  Performed at [facility], [place] [address], [place], [place] [address]   Body fluid culture w Gram Stain     Status: None   Collection Time: 11/08/20 10:09 PM   Specimen: KNEE; Body Fluid  Result Value Ref Range Status   Specimen Description KNEE  Final   Special Requests NONE  Final   Gram Stain   Final    ABUNDANT WBC PRESENT, PREDOMINANTLY PMN RARE GRAM POSITIVE COCCI IN PAIRS Performed at [facility], [place] [address] [place], [place] [address]    Culture RARE METHICILLIN RESISTANT STAPHYLOCOCCUS AUREUS  Final   Report Status 11/11/2020 FINAL  Final   Organism ID, Bacteria METHICILLIN RESISTANT STAPHYLOCOCCUS AUREUS  Final      Susceptibility   Methicillin resistant staphylococcus aureus - MIC*    CIPROFLOXACIN <=0.5 SENSITIVE Sensitive     ERYTHROMYCIN >=8 RESISTANT Resistant     GENTAMICIN <=0.5 SENSITIVE Sensitive     OXACILLIN >=4 RESISTANT Resistant     TETRACYCLINE <=1 SENSITIVE Sensitive     VANCOMYCIN 1 SENSITIVE Sensitive     TRIMETH/SULFA <=10 SENSITIVE Sensitive     CLINDAMYCIN <=0.25 SENSITIVE Sensitive     RIFAMPIN <=0.5 SENSITIVE Sensitive     Inducible Clindamycin NEGATIVE Sensitive     * RARE METHICILLIN RESISTANT STAPHYLOCOCCUS AUREUS  Blood culture (routine x 2)     Status: None   Collection Time: 11/08/20 10:23 PM   Specimen: BLOOD RIGHT FOREARM  Result Value Ref Range Status   Specimen Description BLOOD RIGHT FOREARM  Final   Special Requests   Final    BOTTLES DRAWN AEROBIC AND ANAEROBIC Blood Culture adequate volume   Culture   Final    NO GROWTH 5 DAYS Performed at [facility], [address] [place], [place] [address]    Report Status 11/13/2020 FINAL  Final  Surgical pcr screen     Status: None   Collection Time: 11/13/20  3:40 AM   Specimen: Nasal Mucosa; Nasal Swab  Result Value Ref Range Status   MRSA, PCR NEGATIVE NEGATIVE Final   Staphylococcus  aureus NEGATIVE NEGATIVE Final    Comment: (NOTE) The Xpert SA Assay (FDA approved for NASAL specimens in patients 44 years of age and older), is one component of a comprehensive surveillance program. It is not intended to diagnose infection nor to guide or monitor treatment. Performed at [facility], [place] [address], [place], [place] [address]      Scheduled Meds:  aspirin  81 mg Oral BID   dexamethasone (DECADRON) injection  10 mg Intravenous Once   docusate sodium  100 mg Oral BID   feeding supplement  237 mL Oral TID BM   ferrous sulfate  325 mg Oral TID PC   folic acid  1 mg Oral Daily   HYDROmorphone       HYDROmorphone       multivitamin with minerals  1 tablet Oral Daily   nadolol  10 mg Oral Daily   pantoprazole  40 mg Oral Daily   thiamine  100 mg Oral Daily   Continuous Infusions:  sodium chloride 75 mL/hr at 11/13/20 2333   methocarbamol (ROBAXIN) IV     vancomycin 1,000 mg (11/12/20 0914)     LOS: 6 days   Cherene Altes, MD [facility] Office  [phone] Pager - Text Page per Shea Evans  If 7PM-7AM, please contact night-coverage per Amion 11/14/2020, 7:42 AM

## 2020-11-14 NOTE — Procedures (Signed)
PROCEDURE SUMMARY:  Successful placement of single  lumen PICC line to right brachial vein. Length 38 cm Tip at lower SVC/RA PICC capped No complications Ready for use  EBL < 5 mL   Blessed [place] H Shantia Sanford PA-C 11/14/2020, 10:07 AM

## 2020-11-15 LAB — CBC
HCT: 23.3 % — ABNORMAL LOW (ref 39.0–52.0)
Hemoglobin: 7.4 g/dL — ABNORMAL LOW (ref 13.0–17.0)
MCH: 31.2 pg (ref 26.0–34.0)
MCHC: 31.8 g/dL (ref 30.0–36.0)
MCV: 98.3 fL (ref 80.0–100.0)
Platelets: 121 10*3/uL — ABNORMAL LOW (ref 150–400)
RBC: 2.37 MIL/uL — ABNORMAL LOW (ref 4.22–5.81)
RDW: 16.3 % — ABNORMAL HIGH (ref 11.5–15.5)
WBC: 6.7 10*3/uL (ref 4.0–10.5)
nRBC: 0 % (ref 0.0–0.2)

## 2020-11-15 LAB — AMMONIA: Ammonia: 23 umol/L (ref 9–35)

## 2020-11-15 LAB — COMPREHENSIVE METABOLIC PANEL
ALT: 12 U/L (ref 0–44)
AST: 32 U/L (ref 15–41)
Albumin: 2.1 g/dL — ABNORMAL LOW (ref 3.5–5.0)
Alkaline Phosphatase: 81 U/L (ref 38–126)
Anion gap: 8 (ref 5–15)
BUN: 22 mg/dL (ref 8–23)
CO2: 19 mmol/L — ABNORMAL LOW (ref 22–32)
Calcium: 8.1 mg/dL — ABNORMAL LOW (ref 8.9–10.3)
Chloride: 117 mmol/L — ABNORMAL HIGH (ref 98–111)
Creatinine, Ser: 0.96 mg/dL (ref 0.61–1.24)
GFR, Estimated: >60 mL/min (ref >60)
Glucose, Bld: 101 mg/dL — ABNORMAL HIGH (ref 70–99)
Potassium: 3.9 mmol/L (ref 3.5–5.1)
Sodium: 144 mmol/L (ref 135–145)
Total Bilirubin: 0.5 mg/dL (ref 0.3–1.2)
Total Protein: 6.7 g/dL (ref 6.5–8.1)

## 2020-11-15 MED ORDER — POLYETHYLENE GLYCOL 3350 17 G PO PACK: 17.0000 g | PACK | Freq: Every day | ORAL | 0 refills | Status: DC

## 2020-11-15 NOTE — TOC Transition Note (Signed)
Transition of Care [facility]) - CM/SW Discharge Note   Patient Details  Name: Mark [name] MRN: [id number] Date of Birth: February 20, 1956  Transition of Care [facility]) CM/SW Contact:  Dessa Phi, RN Phone Number: 11/15/2020, 9:51 AM   Clinical Narrative: cjonfirmed w/MD-no picc lock. Has picc line in place-d/c home w/[facility] rep-iv infusion-meds;supplies,initial instruction;[place] to [employer] instruction if needed. Dtr Eben Burow has done iv abx in the past, & agree to asst @ home. Otpt PT referral sent. Once all orders placed. No further CM needs.      Final next level of care: [place] Barriers to Discharge: No Barriers Identified   Patient Goals and CMS Choice Patient states their goals for this hospitalization and ongoing recovery are:: go home CMS Medicare.gov Compare Post Acute Care list provided to:: Patient Choice offered to / list presented to : Patient  Discharge Placement                       Discharge Plan and Services   Discharge Planning Services: CM Consult Post Acute Care Choice: Home Health                    HH Arranged: RN, IV Antibiotics HH Agency: [facility] Date HH Agency Contacted: 11/15/20 Time [place]: [phone] Representative spoke with at [name]: [facility]) Interventions     Readmission Risk Interventions Readmission Risk Prevention Plan 08/03/2020 10/03/2018  Transportation Screening Complete Complete  HRI or Home Care Consult Complete -  Social Work Consult for [place] Planning/Counseling Complete -  Palliative Care Screening Not Applicable -  Medication Review Press photographer) Complete Complete  PCP or Specialist appointment within 3-5 days of discharge - Complete  HRI or [place] - Not Complete  HRI or Home Care Consult Pt Refusal Comments - Pt stated he wanted to go home.  Did not discuss discharging with Home Health services.  SW  Recovery Care/Counseling Consult - Complete  Palliative Care Screening - Not Applicable  Skilled Nursing Facility - Patient Refused  Some recent data might be hidden

## 2020-11-15 NOTE — Progress Notes (Signed)
Discharge instructions provided and discussed. Addressed all questions and concerns. All PIV removed intact.  Jerene Pitch

## 2020-11-15 NOTE — Discharge Summary (Signed)
[facility]  Physician Discharge Summary   Patient ID: Mark [name] MRN: [id number] DOB/AGE: October 01, 1955 65 y.o.  Admit date: 11/08/2020 Discharge date: 11/15/2020    PCP: Mark Ruddy, MD  DISCHARGE DIAGNOSES:  Recurrent MRSA septic right knee Transient supraventricular tachycardia Normocytic anemia/anemia of chronic disease History of alcoholism Liver cirrhosis Coronary artery disease Hyponatremia   RECOMMENDATIONS FOR OUTPATIENT FOLLOW UP: Home health has been ordered for home IV antibiotics Follow-up with orthopedics in 2 weeks    Home Health: Home health RN Equipment/Devices: None  CODE STATUS: DNR here in the hospital  DISCHARGE CONDITION: fair  Diet recommendation: As before  INITIAL HISTORY: 65yo with a history of alcoholic liver cirrhosis and chronic anemia who presented to the ED with increasing pain and swelling of his right knee.  The patient underwent a right total knee arthroplasty in 6195 which was complicated by an infection and required explantation November 2021 with placement of an antibiotic spacer and long-term antibiotic treatment.  In April 2022 he underwent replacement of his total knee.  He was prescribed lifelong doxycycline at the direction of the Infectious Disease service but ran out approximately 3 weeks prior to this admission.  Orthopedics evaluated the patient in the ED and performed an arthrocentesis.  Arrangements were then made for him to be transferred from [facility] to [facility] ED for planned orthopedic procedure.  He was also found to be significantly anemic and therefore was transfused while still in the ED at [facility].   Significant Events: 2015 R knee TKA 02/2020 removal TKA - abx spacer placed 4/22 redo TKA 7/14 admit via [place] w/ recurrent septic R knee 7/14 arthrocentesis right knee in the ED 7/14 1 U PRBC 7/15 1 U PRBC 7/19 I&D R knee w/ polyethylene revision  PICC line placement    Consultants: Orthopedic Surgery   HOSPITAL COURSE:     Recurrent MRSA septic right knee TKA Patient was noncompliant with prescribed lifelong doxycycline suppression therapy. Seen by orthopedic surgery- s/p I&D 7/19 Seen by infectious disease.  PICC line was placed.  Patient to be discharged on daptomycin for 8 weeks.  ID to follow-up in office.   Transient SVT Appreciated while in ED in setting of DH/anemia - asymptomatic   Acute on chronic normocytic anemia - anemia of chronic disease Due to alcoholism as well as smoldering infection and poor nutrition - no evidence of gross bleeding - hemoglobin 5.7 on admission - does have a known history of ulcerative duodenitis noted on EGD 2019 - denies any clear GI bleeding - transfused a total of 2 units PRBC.    Acute kidney injury Creatinine climbed somewhat following admission but improved again with volume expansion and transfusion  Leukopenia HIV negative -likely due to bone marrow suppression in setting of alcoholism - WBC has normalized   Alcoholism - cirrhosis of the liver Continue usual home medical therapies - no evidence of alcohol withdrawal thus far - f/u ammonia in AM   CAD Aspirin - asymptomatic   Hyponatremia Due to alcoholism, cirrhosis, as well as volume depletion - resolved with volume expansion   Patient wants to go home today.  Seems to be stable otherwise.  No bleeding noted.  Tolerating his diet.  Okay for discharge today.  PERTINENT LABS:  The results of significant diagnostics from this hospitalization (including imaging, microbiology, ancillary and laboratory) are listed below for reference.    Microbiology: Recent Results (from the past 240 hour(s))  Blood culture (routine  x 2)     Status: None   Collection Time: 11/08/20  6:15 PM   Specimen: BLOOD LEFT ARM  Result Value Ref Range Status   Specimen Description BLOOD LEFT ARM  Final   Special Requests   Final    BOTTLES DRAWN AEROBIC AND ANAEROBIC  Blood Culture adequate volume   Culture   Final    NO GROWTH 5 DAYS Performed at [facility], [address], [place], [place] [address]    Report Status 11/13/2020 FINAL  Final  Resp Panel by RT-PCR (Flu A&B, Covid) Nasopharyngeal Swab     Status: None   Collection Time: 11/08/20  9:03 PM   Specimen: Nasopharyngeal Swab; Nasopharyngeal(NP) swabs in vial transport medium  Result Value Ref Range Status   SARS Coronavirus 2 by RT PCR NEGATIVE NEGATIVE Final    Comment: (NOTE) SARS-CoV-2 target nucleic acids are NOT DETECTED.  The SARS-CoV-2 RNA is generally detectable in upper respiratory specimens during the acute phase of infection. The lowest concentration of SARS-CoV-2 viral copies this assay can detect is 138 copies/mL. A negative result does not preclude SARS-Cov-2 infection and should not be used as the sole basis for treatment or other patient management decisions. A negative result may occur with  improper specimen collection/handling, submission of specimen other than nasopharyngeal swab, presence of viral mutation(s) within the areas targeted by this assay, and inadequate number of viral copies(<138 copies/mL). A negative result must be combined with clinical observations, patient history, and epidemiological information. The expected result is Negative.  Fact Sheet for Patients:  EntrepreneurPulse.com.au  Fact Sheet for Healthcare Providers:  IncredibleEmployment.be  This test is no t yet approved or cleared by the [place] FDA and  has been authorized for detection and/or diagnosis of SARS-CoV-2 by FDA under an Emergency Use Authorization (EUA). This EUA will remain  in effect (meaning this test can be used) for the duration of the COVID-19 declaration under Section 564(b)(1) of the Act, 21 U.S.C.section 360bbb-3(b)(1), unless the authorization is terminated  or revoked sooner.       Influenza A by PCR NEGATIVE  NEGATIVE Final   Influenza B by PCR NEGATIVE NEGATIVE Final    Comment: (NOTE) The Xpert Xpress SARS-CoV-2/FLU/RSV plus assay is intended as an aid in the diagnosis of influenza from Nasopharyngeal swab specimens and should not be used as a sole basis for treatment. Nasal washings and aspirates are unacceptable for Xpert Xpress SARS-CoV-2/FLU/RSV testing.  Fact Sheet for Patients: EntrepreneurPulse.com.au  Fact Sheet for Healthcare Providers: IncredibleEmployment.be  This test is not yet approved or cleared by the [place] FDA and has been authorized for detection and/or diagnosis of SARS-CoV-2 by FDA under an Emergency Use Authorization (EUA). This EUA will remain in effect (meaning this test can be used) for the duration of the COVID-19 declaration under Section 564(b)(1) of the Act, 21 U.S.C. section 360bbb-3(b)(1), unless the authorization is terminated or revoked.  Performed at [facility], [place] [address], [place], [place] [address]   Body fluid culture w Gram Stain     Status: None (Preliminary result)   Collection Time: 11/08/20 10:09 PM   Specimen: KNEE; Body Fluid  Result Value Ref Range Status   Specimen Description KNEE  Final   Special Requests NONE  Final   Gram Stain   Final    ABUNDANT WBC PRESENT, PREDOMINANTLY PMN RARE GRAM POSITIVE COCCI IN PAIRS    Culture   Final    RARE METHICILLIN RESISTANT  STAPHYLOCOCCUS AUREUS Sent to [facility] for further susceptibility testing. Performed at [facility], [place] [address], [place], [place] [address]    Report Status PENDING  Incomplete   Organism ID, Bacteria METHICILLIN RESISTANT STAPHYLOCOCCUS AUREUS  Final      Susceptibility   Methicillin resistant staphylococcus aureus - MIC*    CIPROFLOXACIN <=0.5 SENSITIVE Sensitive     ERYTHROMYCIN >=8 RESISTANT Resistant     GENTAMICIN <=0.5 SENSITIVE Sensitive     OXACILLIN >=4 RESISTANT Resistant     TETRACYCLINE <=1  SENSITIVE Sensitive     VANCOMYCIN 1 SENSITIVE Sensitive     TRIMETH/SULFA <=10 SENSITIVE Sensitive     CLINDAMYCIN <=0.25 SENSITIVE Sensitive     RIFAMPIN <=0.5 SENSITIVE Sensitive     Inducible Clindamycin NEGATIVE Sensitive     * RARE METHICILLIN RESISTANT STAPHYLOCOCCUS AUREUS  Blood culture (routine x 2)     Status: None   Collection Time: 11/08/20 10:23 PM   Specimen: BLOOD RIGHT FOREARM  Result Value Ref Range Status   Specimen Description BLOOD RIGHT FOREARM  Final   Special Requests   Final    BOTTLES DRAWN AEROBIC AND ANAEROBIC Blood Culture adequate volume   Culture   Final    NO GROWTH 5 DAYS Performed at [facility], [address] [place], [place] [address]    Report Status 11/13/2020 FINAL  Final  Surgical pcr screen     Status: None   Collection Time: 11/13/20  3:40 AM   Specimen: Nasal Mucosa; Nasal Swab  Result Value Ref Range Status   MRSA, PCR NEGATIVE NEGATIVE Final   Staphylococcus aureus NEGATIVE NEGATIVE Final    Comment: (NOTE) The Xpert SA Assay (FDA approved for NASAL specimens in patients 32 years of age and older), is one component of a comprehensive surveillance program. It is not intended to diagnose infection nor to guide or monitor treatment. Performed at [facility], [place] [address] [place], [place] [address]      Labs:     Basic Metabolic Panel: Recent Labs  Lab 11/10/20 0438 11/11/20 0534 11/12/20 0405 11/14/20 0406 11/15/20 0502  NA 140 139 142 140 144  K 4.0 3.8 4.0 3.8 3.9  CL 112* 111 114* 115* 117*  CO2 22 21* 21* 20* 19*  GLUCOSE 109* 73 82 108* 101*  BUN '18 18 17 20 22  ' CREATININE 1.45* 1.18 1.18 1.33* 0.96  CALCIUM 7.9* 8.1* 8.4* 7.6* 8.1*  MG  --  1.9  --   --   --   PHOS  --  2.9  --   --   --    Liver Function Tests: Recent Labs  Lab 11/11/20 0534 11/14/20 0406 11/15/20 0502  AST 20 22 32  ALT '9 9 12  ' ALKPHOS 77 67 81  BILITOT 0.9 0.6 0.5  PROT 7.1 6.2* 6.7  ALBUMIN 2.2*  1.9* 2.1*    Recent Labs  Lab 11/15/20 0502  AMMONIA 23   CBC: Recent Labs  Lab 11/10/20 0438 11/11/20 0534 11/12/20 0405 11/14/20 0406 11/15/20 0502  WBC 2.9* 3.3* 5.6 5.2 6.7  HGB 6.8* 8.4* 9.1* 7.8* 7.4*  HCT 21.6* 25.6* 28.1* 24.9* 23.3*  MCV 97.7 94.5 96.2 99.2 98.3  PLT 196 183 199 146* 121*   Cardiac Enzymes: Recent Labs  Lab 11/14/20 0406  CKTOTAL 16*      IMAGING STUDIES DG Knee Complete 4 Views Right  Result Date: 11/08/2020 CLINICAL DATA:  65 year old male with concern  for right knee infection. EXAM: RIGHT KNEE - COMPLETE 4+ VIEW COMPARISON:  None. FINDINGS: There is a total right knee arthroplasty. The arthroplasty components appear intact and in anatomic alignment. There is approximately 2 mm gap between the bone and tibial plateau components of the hardware which may represent a degree of loosening or an infectious process. The bones are osteopenic. There is no acute fracture or dislocation. There is degenerative changes or heterotopic bone at the lateral tibial plateau. There is joint effusion and diffuse soft tissue swelling of the knee concerning for an infectious process. Clinical correlation is recommended. No soft tissue gas. IMPRESSION: 1. No acute fracture or dislocation. 2. Total right knee arthroplasty appears intact and in anatomic alignment. 3. Diffuse soft tissue swelling of the knee and joint effusion concerning for an infectious process. Clinical correlation is recommended. Electronically Signed   By: Anner Crete M.D.   On: 11/08/2020 19:33   IR PICC PLACEMENT RIGHT >5 YRS INC IMG GUIDE  Result Date: 11/14/2020 INDICATION: Septic arthritis, in need of long-term IV antibiotic treatment. Request for a PICC line placement. EXAM: ULTRASOUND AND FLUOROSCOPIC GUIDED PICC LINE INSERTION MEDICATIONS: 1% lidocaine CONTRAST:  None FLUOROSCOPY TIME:  24 seconds (2 mGy) COMPLICATIONS: None immediate. TECHNIQUE: The procedure, risks, benefits, and  alternatives were explained to the patient and informed written consent was obtained. The right upper extremity was prepped with chlorhexidine in a sterile fashion, and a sterile drape was applied covering the operative field. Maximum barrier sterile technique with sterile gowns and gloves were used for the procedure. A timeout was performed prior to the initiation of the procedure. Local anesthesia was provided with 1% lidocaine. After the overlying soft tissues were anesthetized with 1% lidocaine, a micropuncture kit was utilized to access the right brachial vein. Real-time ultrasound guidance was utilized for vascular access including the acquisition of a permanent ultrasound image documenting patency of the accessed vessel. A guidewire was advanced to the level of the superior caval-atrial junction for measurement purposes and the PICC line was cut to length. A peel-away sheath was placed and a 38 cm, 5 [place], single lumen was inserted to level of the superior caval-atrial junction. A post procedure spot fluoroscopic was obtained. The catheter easily aspirated and flushed and was secured in place. A dressing was placed. The patient tolerated the procedure well without immediate post procedural complication. FINDINGS: After catheter placement, the tip lies within the superior cavoatrial junction. The catheter aspirates and flushes normally and is ready for immediate use. IMPRESSION: Successful ultrasound and fluoroscopic guided placement of a right brachial vein approach, 38 cm, 5 [place], single lumen PICC with tip at the superior caval-atrial junction. The PICC line is ready for immediate use. Read by: Durenda Guthrie, PA-C Electronically Signed   By: Markus Daft M.D.   On: 11/14/2020 10:41   [place] EKG SITE RITE  Result Date: 11/12/2020 If Site Rite image not attached, placement could not be confirmed due to current cardiac rhythm.   DISCHARGE EXAMINATION: Vitals:   11/14/20 0827 11/14/20 1248 11/14/20 2042  11/15/20 0456  BP: (!) 109/45 (!) 148/77 (!) 151/68 136/61  Pulse: 65 64 61 (!) 58  Resp: '18 15 18 18  ' Temp: 98.5 F (36.9 C) 98.3 F (36.8 C) 98.5 F (36.9 C) 98.3 F (36.8 C)  TempSrc:  Oral Oral Oral  SpO2: 100% 98% 100% 100%  Weight:      Height:       General appearance: Awake alert.  In no  distress Resp: Clear to auscultation bilaterally.  Normal effort Cardio: S1-S2 is normal regular.  No S3-S4.  No rubs murmurs or bruit GI: Abdomen is soft.  Nontender nondistended.  Bowel sounds are present normal.  No masses organomegaly    DISPOSITION: Home  Discharge Instructions     [facility] pharmacist to adjust dose for Vancomycin, Aminoglycosides and other anti-infective therapies as requested by physician.   Complete by: As directed    [facility] to provide Cath Flo 94m   Complete by: As directed    Administer for PICC line occlusion and as ordered by physician for other access device issues.   Ambulatory referral to Physical Therapy   Complete by: As directed    Anaphylaxis Kit: Provided to treat any anaphylactic reaction to the medication being provided to the patient if First Dose or when requested by physician   Complete by: As directed    Epinephrine 152mml vial / amp: Administer 0.29m65m0.29ml74mubcutaneously once for moderate to severe anaphylaxis, nurse to call physician and pharmacy when reaction occurs and call 911 if needed for immediate care   Diphenhydramine 50mg54mIV vial: Administer 25-50mg 56mM PRN for first dose reaction, rash, itching, mild reaction, nurse to call physician and pharmacy when reaction occurs   Sodium Chloride 0.9% NS 500ml I529mdminister if needed for hypovolemic blood pressure drop or as ordered by physician after call to physician with anaphylactic reaction   Call MD for:  difficulty breathing, headache or visual disturbances   Complete by: As directed    Call MD for:  extreme fatigue   Complete by: As directed     Call MD for:  persistant dizziness or light-headedness   Complete by: As directed    Call MD for:  persistant nausea and vomiting   Complete by: As directed    Call MD for:  severe uncontrolled pain   Complete by: As directed    Call MD for:  temperature >100.4   Complete by: As directed    Change dressing on IV access line weekly and PRN   Complete by: As directed    Diet - low sodium heart healthy   Complete by: As directed    Discharge instructions   Complete by: As directed    Please take your medications as prescribed.  Follow instructions provided by the infectious disease specialist.  Please follow-up with Dr. Olin asAlvan Dametructed in 2 weeks.  You were cared for by a hospitalist during your hospital stay. If you have any questions about your discharge medications or the care you received while you were in the hospital after you are discharged, you can call the unit and asked to speak with the hospitalist on call if the hospitalist that took care of you is not available. Once you are discharged, your primary care physician will handle any further medical issues. Please note that NO REFILLS for any discharge medications will be authorized once you are discharged, as it is imperative that you return to your primary care physician (or establish a relationship with a primary care physician if you do not have one) for your aftercare needs so that they can reassess your need for medications and monitor your lab values. If you do not have a primary care physician, you can call [phone] referral.   Flush IV access with Sodium Chloride 0.9% and Heparin 10 units/ml or 100 units/ml   Complete by: As directed    Home infusion instructions -  [facility]   Complete by: As directed    Instructions: Flush IV access with Sodium Chloride 0.9% and Heparin 10units/ml or 100units/ml   Change dressing on IV access line: Weekly and PRN   Instructions Cath Flo 66m: Administer for PICC  Line occlusion and as ordered by physician for other access device   [facility] pharmacist to adjust dose for: Vancomycin, Aminoglycosides and other anti-infective therapies as requested by physician   Increase activity slowly   Complete by: As directed    Method of administration may be changed at the discretion of home infusion pharmacist based upon assessment of the patient and/or caregiver's ability to self-administer the medication ordered   Complete by: As directed    No wound care   Complete by: As directed           Allergies as of 11/15/2020   No Known Allergies      Medication List     STOP taking these medications    doxycycline 100 MG tablet Commonly known as: VIBRA-TABS   naproxen 500 MG tablet Commonly known as: NAPROSYN       TAKE these medications    aspirin 81 MG chewable tablet Chew 1 tablet (81 mg total) by mouth 2 (two) times daily for 28 days. What changed: when to take this   celecoxib 200 MG capsule Commonly known as: CELEBREX Take 200 mg by mouth daily.   daptomycin  IVPB Commonly known as: CUBICIN Inject 500 mg into the vein daily. Indication:  Prosthetic joint infection First Dose: Yes Last Day of Therapy:  12/26/2020 Labs - Once weekly:  CBC/D, BMP, and CPK Labs - Every other week:  ESR and CRP Method of administration: IV Push Method of administration may be changed at the discretion of home infusion pharmacist based upon assessment of the patient and/or caregiver's ability to self-administer the medication ordered.   docusate sodium 100 MG capsule Commonly known as: Colace Take 1 capsule (100 mg total) by mouth 2 (two) times daily.   ferrous sulfate 325 (65 FE) MG tablet Take 1 tablet (325 mg total) by mouth 2 (two) times daily with a meal. Take for two weeks as tolerated.   folic acid 1 MG tablet Commonly known as: FOLVITE TAKE 1 TABLET(1 MG) BY MOUTH DAILY What changed: See the new instructions.   furosemide  20 MG tablet Commonly known as: LASIX TAKE 1 TABLET(20 MG) BY MOUTH TWICE DAILY What changed: See the new instructions.   methocarbamol 500 MG tablet Commonly known as: ROBAXIN Take 1 tablet (500 mg total) by mouth every 6 (six) hours as needed for muscle spasms.   nadolol 20 MG tablet Commonly known as: CORGARD TAKE 1 TABLET BY MOUTH EVERY DAY   oxyCODONE 5 MG immediate release tablet Commonly known as: Oxy IR/ROXICODONE Take 1-2 tablets (5-10 mg total) by mouth every 4 (four) hours as needed for severe pain. What changed: reasons to take this   pantoprazole 40 MG tablet Commonly known as: PROTONIX TAKE 1 TABLET BY MOUTH EVERY DAY   polyethylene glycol 17 g packet Commonly known as: MIRALAX / GLYCOLAX Take 17 g by mouth daily.               Discharge Care Instructions  (From admission, onward)           Start     Ordered   11/14/20 0000  Change dressing on IV access line weekly and PRN  (Home infusion instructions - [facility] )        11/14/20 1714              Follow-up Information     Paralee Cancel, MD. Schedule an appointment as soon as possible for a visit in 2 week(s).   Specialty: Orthopedic Surgery Contact information: [address] [place] [address] [phone]         [facility] Schedule an appointment as soon as possible for a visit.   Specialty: Rehabilitation Contact information: [address] [id number] [place] [phone]        [facility] Follow up.   Why: Home iv infusion-med,supplies;they will use [facility] for ongoing labs,flush,& instruction. Contact information: [place] [phone]                TOTAL DISCHARGE TIME: 14 minutes  [facility] Pager on [email or web address]  11/16/2020, 11:41 AM

## 2020-11-15 NOTE — Progress Notes (Signed)
Patient ID: Mark [name], male   DOB: 1956-03-10, 65 y.o.   MRN: [id number] Subjective: 2 Days Post-Op Procedure(s) (LRB): IRRIGATION AND DEBRIDEMENT KNEE (Right)    Patient reports pain as mild to moderate but with decent pain control currently. PIC line placed Ready to go home  Objective:   VITALS:   Vitals:   11/14/20 2042 11/15/20 0456  BP: (!) 151/68 136/61  Pulse: 61 (!) 58  Resp: 18 18  Temp: 98.5 F (36.9 C) 98.3 F (36.8 C)  SpO2: 100% 100%    Neurovascular intact Incision: dressing C/D/I  LABS Recent Labs    11/14/20 0406 11/15/20 0502  HGB 7.8* 7.4*  HCT 24.9* 23.3*  WBC 5.2 6.7  PLT 146* 121*    Recent Labs    11/14/20 0406 11/15/20 0502  NA 140 144  K 3.8 3.9  BUN 20 22  CREATININE 1.33* 0.96  GLUCOSE 108* 101*    No results for input(s): LABPT, INR in the last 72 hours.   Assessment/Plan: 2 Days Post-Op Procedure(s) (LRB): IRRIGATION AND DEBRIDEMENT KNEE (Right)  Plan: Home today RTC to see Binh Doten in 2 weeks for wound check IV antibiotics (Daptomycin) per ID for 6 weeks followed by suppressive antibiotics for life

## 2020-11-15 NOTE — Progress Notes (Signed)
Physical Therapy Treatment Patient Details Name: Mark [name] MRN: [id number] DOB: 1955/10/01 Today's Date: 11/15/2020    History of Present Illness Pt is 65 yo male admitted with recurrent R TKA revision infection on 11/08/20.  He is s/p open excisional and non-excisional debridement of R knee w polyethylene revision on 11/13/20.  Pt had R TKA in 2015 which became infected and removed in 11/21 with antibiotic spacer placement.  In 4/22 he underwent revision of R TKA and was on lifelong doxycycline but ran out 3 weeks prior to admission and R knee developed pain and edema.  Pt has hx of ETOH and drug abuse, arthritis, HTN, MI, and multiple sx on R knee.    PT Comments    POD # 2 Pt EOB getting self dressed, eager to D/C to home.  Assisted with amb in hallway.  General Gait Details: excessive lean on walker and quick step gait.  Educated to decrease gait speed, increase heel strike/WB ing.  Pt non compliant with any suggestions.  Returned to room and attempted to educate on TE's however again pt not interested.  "The only thing I want to do is get the _____  out of here".   Follow Up Recommendations  Outpatient PT;Supervision for mobility/OOB     Equipment Recommendations  None recommended by PT    Recommendations for Other Services       Precautions / Restrictions Precautions Precautions: Fall Precaution Comments: instructed no pillow under knee    Mobility  Bed Mobility Overal bed mobility: Modified Independent             General bed mobility comments: pt EOB getting dressed with no assist    Transfers Overall transfer level: Needs assistance Equipment used: Rolling walker (2 wheeled) Transfers: Sit to/from [facility] Sit to Stand: Supervision         General transfer comment: pt self able impulsive  Ambulation/Gait Ambulation/Gait assistance: Supervision Gait Distance (Feet): 75 Feet Assistive device: Rolling walker (2 wheeled) Gait  Pattern/deviations: Step-to pattern;Decreased stride length;Decreased stance time - right;Decreased weight shift to right Gait velocity: decreased   General Gait Details: excessive lean on walker and quick step gait.  Educated to decrease gait speed, increase heel strike/WB ing   Stairs             Wheelchair Mobility    Modified Rankin (Stroke Patients Only)       Balance                                            Cognition Arousal/Alertness: Awake/alert Behavior During Therapy: WFL for tasks assessed/performed Overall Cognitive Status: Within Functional Limits for tasks assessed                                 General Comments: AxO x 3 eager to DE/C to home      Exercises      General Comments        Pertinent Vitals/Pain Pain Assessment: Faces Faces Pain Scale: Hurts a little bit Pain Location: R knee Pain Descriptors / Indicators: Discomfort;Operative site guarding Pain Intervention(s): Monitored during session;Repositioned    Home Living                      Prior Function  PT Goals (current goals can now be found in the care plan section) Progress towards PT goals: Progressing toward goals    Frequency    Min 5X/week      PT Plan Current plan remains appropriate    Co-evaluation              AM-PAC PT "6 Clicks" Mobility   Outcome Measure  Help needed turning from your back to your side while in a flat bed without using bedrails?: A Little Help needed moving from lying on your back to sitting on the side of a flat bed without using bedrails?: A Little Help needed moving to and from a bed to a chair (including a wheelchair)?: A Little Help needed standing up from a chair using your arms (e.g., wheelchair or bedside chair)?: A Little Help needed to walk in hospital room?: A Little Help needed climbing 3-5 steps with a railing? : A Lot 6 Click Score: 17    End of Session  Equipment Utilized During Treatment: Gait belt Activity Tolerance: Patient tolerated treatment well Patient left: in bed;with call bell/phone within reach;with bed alarm set Nurse Communication: Mobility status PT Visit Diagnosis: Other abnormalities of gait and mobility (R26.89);Muscle weakness (generalized) (M62.81)     Time: 3007-6226 PT Time Calculation (min) (ACUTE ONLY): 17 min  Charges:  $Gait Training: 8-22 mins                     Rica Koyanagi  PTA Acute  Rehabilitation Services Pager      [phone] Office      [phone]

## 2020-11-16 DIAGNOSIS — M00861 Arthritis due to other bacteria, right knee: Secondary | ICD-10-CM | POA: Diagnosis not present

## 2020-11-16 DIAGNOSIS — T8454XA Infection and inflammatory reaction due to internal left knee prosthesis, initial encounter: Secondary | ICD-10-CM | POA: Diagnosis not present

## 2020-11-16 DIAGNOSIS — R7881 Bacteremia: Secondary | ICD-10-CM | POA: Diagnosis not present

## 2020-11-16 DIAGNOSIS — B9562 Methicillin resistant Staphylococcus aureus infection as the cause of diseases classified elsewhere: Secondary | ICD-10-CM | POA: Diagnosis not present

## 2020-11-16 DIAGNOSIS — M009 Pyogenic arthritis, unspecified: Secondary | ICD-10-CM | POA: Diagnosis not present

## 2020-11-16 LAB — MISC LABCORP TEST (SEND OUT)
LabCorp test name: 96388
Labcorp test code: 96388

## 2020-11-18 LAB — MINIMUM INHIBITORY CONC. (1 DRUG)

## 2020-11-18 LAB — MIC RESULT

## 2020-11-19 DIAGNOSIS — M00861 Arthritis due to other bacteria, right knee: Secondary | ICD-10-CM | POA: Diagnosis not present

## 2020-11-19 DIAGNOSIS — M009 Pyogenic arthritis, unspecified: Secondary | ICD-10-CM | POA: Diagnosis not present

## 2020-11-19 DIAGNOSIS — R7881 Bacteremia: Secondary | ICD-10-CM | POA: Diagnosis not present

## 2020-11-19 DIAGNOSIS — T8454XA Infection and inflammatory reaction due to internal left knee prosthesis, initial encounter: Secondary | ICD-10-CM | POA: Diagnosis not present

## 2020-11-19 DIAGNOSIS — B9562 Methicillin resistant Staphylococcus aureus infection as the cause of diseases classified elsewhere: Secondary | ICD-10-CM | POA: Diagnosis not present

## 2020-11-20 ENCOUNTER — Telehealth: Payer: Self-pay

## 2020-11-20 NOTE — Telephone Encounter (Signed)
Critical results called in from [facility].   RBC 2.28 drawn on 7/25. HH to fax results once finalized.   Forwarding to provider.

## 2020-11-21 DIAGNOSIS — M25561 Pain in right knee: Secondary | ICD-10-CM | POA: Diagnosis not present

## 2020-11-21 LAB — BODY FLUID CULTURE W GRAM STAIN

## 2020-11-22 ENCOUNTER — Other Ambulatory Visit: Payer: Self-pay | Admitting: Family Medicine

## 2020-11-24 DIAGNOSIS — M00861 Arthritis due to other bacteria, right knee: Secondary | ICD-10-CM | POA: Diagnosis not present

## 2020-11-24 DIAGNOSIS — B9562 Methicillin resistant Staphylococcus aureus infection as the cause of diseases classified elsewhere: Secondary | ICD-10-CM | POA: Diagnosis not present

## 2020-11-24 DIAGNOSIS — M009 Pyogenic arthritis, unspecified: Secondary | ICD-10-CM | POA: Diagnosis not present

## 2020-11-24 DIAGNOSIS — T8454XA Infection and inflammatory reaction due to internal left knee prosthesis, initial encounter: Secondary | ICD-10-CM | POA: Diagnosis not present

## 2020-11-24 DIAGNOSIS — R7881 Bacteremia: Secondary | ICD-10-CM | POA: Diagnosis not present

## 2020-11-26 DIAGNOSIS — M25561 Pain in right knee: Secondary | ICD-10-CM | POA: Diagnosis not present

## 2020-11-26 DIAGNOSIS — M009 Pyogenic arthritis, unspecified: Secondary | ICD-10-CM | POA: Diagnosis not present

## 2020-11-26 DIAGNOSIS — B9562 Methicillin resistant Staphylococcus aureus infection as the cause of diseases classified elsewhere: Secondary | ICD-10-CM | POA: Diagnosis not present

## 2020-11-26 DIAGNOSIS — T8454XA Infection and inflammatory reaction due to internal left knee prosthesis, initial encounter: Secondary | ICD-10-CM | POA: Diagnosis not present

## 2020-11-26 DIAGNOSIS — R7881 Bacteremia: Secondary | ICD-10-CM | POA: Diagnosis not present

## 2020-11-26 DIAGNOSIS — M00861 Arthritis due to other bacteria, right knee: Secondary | ICD-10-CM | POA: Diagnosis not present

## 2020-11-28 DIAGNOSIS — M25561 Pain in right knee: Secondary | ICD-10-CM | POA: Diagnosis not present

## 2020-11-30 DIAGNOSIS — Z0000 Encounter for general adult medical examination without abnormal findings: Secondary | ICD-10-CM | POA: Diagnosis not present

## 2020-12-01 DIAGNOSIS — T8454XA Infection and inflammatory reaction due to internal left knee prosthesis, initial encounter: Secondary | ICD-10-CM | POA: Diagnosis not present

## 2020-12-01 DIAGNOSIS — M00861 Arthritis due to other bacteria, right knee: Secondary | ICD-10-CM | POA: Diagnosis not present

## 2020-12-01 DIAGNOSIS — R7881 Bacteremia: Secondary | ICD-10-CM | POA: Diagnosis not present

## 2020-12-01 DIAGNOSIS — M009 Pyogenic arthritis, unspecified: Secondary | ICD-10-CM | POA: Diagnosis not present

## 2020-12-01 DIAGNOSIS — B9562 Methicillin resistant Staphylococcus aureus infection as the cause of diseases classified elsewhere: Secondary | ICD-10-CM | POA: Diagnosis not present

## 2020-12-05 DIAGNOSIS — R7881 Bacteremia: Secondary | ICD-10-CM | POA: Diagnosis not present

## 2020-12-05 DIAGNOSIS — M009 Pyogenic arthritis, unspecified: Secondary | ICD-10-CM | POA: Diagnosis not present

## 2020-12-05 DIAGNOSIS — T8454XA Infection and inflammatory reaction due to internal left knee prosthesis, initial encounter: Secondary | ICD-10-CM | POA: Diagnosis not present

## 2020-12-05 DIAGNOSIS — B9562 Methicillin resistant Staphylococcus aureus infection as the cause of diseases classified elsewhere: Secondary | ICD-10-CM | POA: Diagnosis not present

## 2020-12-05 DIAGNOSIS — M00861 Arthritis due to other bacteria, right knee: Secondary | ICD-10-CM | POA: Diagnosis not present

## 2020-12-07 DIAGNOSIS — M00861 Arthritis due to other bacteria, right knee: Secondary | ICD-10-CM | POA: Diagnosis not present

## 2020-12-07 DIAGNOSIS — B9562 Methicillin resistant Staphylococcus aureus infection as the cause of diseases classified elsewhere: Secondary | ICD-10-CM | POA: Diagnosis not present

## 2020-12-07 DIAGNOSIS — R7881 Bacteremia: Secondary | ICD-10-CM | POA: Diagnosis not present

## 2020-12-07 DIAGNOSIS — T8454XA Infection and inflammatory reaction due to internal left knee prosthesis, initial encounter: Secondary | ICD-10-CM | POA: Diagnosis not present

## 2020-12-07 DIAGNOSIS — M009 Pyogenic arthritis, unspecified: Secondary | ICD-10-CM | POA: Diagnosis not present

## 2020-12-08 DIAGNOSIS — M00861 Arthritis due to other bacteria, right knee: Secondary | ICD-10-CM | POA: Diagnosis not present

## 2020-12-08 DIAGNOSIS — M009 Pyogenic arthritis, unspecified: Secondary | ICD-10-CM | POA: Diagnosis not present

## 2020-12-08 DIAGNOSIS — T8454XA Infection and inflammatory reaction due to internal left knee prosthesis, initial encounter: Secondary | ICD-10-CM | POA: Diagnosis not present

## 2020-12-08 DIAGNOSIS — R7881 Bacteremia: Secondary | ICD-10-CM | POA: Diagnosis not present

## 2020-12-08 DIAGNOSIS — B9562 Methicillin resistant Staphylococcus aureus infection as the cause of diseases classified elsewhere: Secondary | ICD-10-CM | POA: Diagnosis not present

## 2020-12-10 DIAGNOSIS — T8451XA Infection and inflammatory reaction due to internal right hip prosthesis, initial encounter: Secondary | ICD-10-CM | POA: Diagnosis not present

## 2020-12-10 DIAGNOSIS — M00861 Arthritis due to other bacteria, right knee: Secondary | ICD-10-CM | POA: Diagnosis not present

## 2020-12-10 DIAGNOSIS — R7881 Bacteremia: Secondary | ICD-10-CM | POA: Diagnosis not present

## 2020-12-10 DIAGNOSIS — M009 Pyogenic arthritis, unspecified: Secondary | ICD-10-CM | POA: Diagnosis not present

## 2020-12-10 DIAGNOSIS — T8454XA Infection and inflammatory reaction due to internal left knee prosthesis, initial encounter: Secondary | ICD-10-CM | POA: Diagnosis not present

## 2020-12-10 DIAGNOSIS — B9562 Methicillin resistant Staphylococcus aureus infection as the cause of diseases classified elsewhere: Secondary | ICD-10-CM | POA: Diagnosis not present

## 2020-12-12 NOTE — Progress Notes (Signed)
Mark Butts RN at Dr Trevor Mace office notified that Mark [name] is currently dealing with a septic right knee replacement and has a PICC line and receiving IV antibiotics. She states she will let Dr Ninfa Linden know and plan to cx surgery for now.

## 2020-12-13 DIAGNOSIS — M25561 Pain in right knee: Secondary | ICD-10-CM | POA: Diagnosis not present

## 2020-12-15 DIAGNOSIS — R7881 Bacteremia: Secondary | ICD-10-CM | POA: Diagnosis not present

## 2020-12-15 DIAGNOSIS — B9562 Methicillin resistant Staphylococcus aureus infection as the cause of diseases classified elsewhere: Secondary | ICD-10-CM | POA: Diagnosis not present

## 2020-12-15 DIAGNOSIS — M00861 Arthritis due to other bacteria, right knee: Secondary | ICD-10-CM | POA: Diagnosis not present

## 2020-12-15 DIAGNOSIS — T8454XA Infection and inflammatory reaction due to internal left knee prosthesis, initial encounter: Secondary | ICD-10-CM | POA: Diagnosis not present

## 2020-12-15 DIAGNOSIS — M009 Pyogenic arthritis, unspecified: Secondary | ICD-10-CM | POA: Diagnosis not present

## 2020-12-17 DIAGNOSIS — M00861 Arthritis due to other bacteria, right knee: Secondary | ICD-10-CM | POA: Diagnosis not present

## 2020-12-17 DIAGNOSIS — R7881 Bacteremia: Secondary | ICD-10-CM | POA: Diagnosis not present

## 2020-12-17 DIAGNOSIS — T8451XA Infection and inflammatory reaction due to internal right hip prosthesis, initial encounter: Secondary | ICD-10-CM | POA: Diagnosis not present

## 2020-12-17 DIAGNOSIS — B9562 Methicillin resistant Staphylococcus aureus infection as the cause of diseases classified elsewhere: Secondary | ICD-10-CM | POA: Diagnosis not present

## 2020-12-17 DIAGNOSIS — T8454XA Infection and inflammatory reaction due to internal left knee prosthesis, initial encounter: Secondary | ICD-10-CM | POA: Diagnosis not present

## 2020-12-17 DIAGNOSIS — M009 Pyogenic arthritis, unspecified: Secondary | ICD-10-CM | POA: Diagnosis not present

## 2020-12-18 ENCOUNTER — Other Ambulatory Visit: Payer: Self-pay

## 2020-12-18 ENCOUNTER — Encounter: Payer: Self-pay | Admitting: Internal Medicine

## 2020-12-18 ENCOUNTER — Ambulatory Visit (INDEPENDENT_AMBULATORY_CARE_PROVIDER_SITE_OTHER): Payer: Medicare (Managed Care) | Admitting: Internal Medicine

## 2020-12-18 ENCOUNTER — Telehealth: Payer: Self-pay

## 2020-12-18 DIAGNOSIS — T8453XD Infection and inflammatory reaction due to internal right knee prosthesis, subsequent encounter: Secondary | ICD-10-CM | POA: Diagnosis not present

## 2020-12-18 MED ORDER — DAPTOMYCIN IV (FOR PTA / DISCHARGE USE ONLY): 500.0000 mg | INTRAVENOUS | 0 refills | Status: AC

## 2020-12-18 MED ORDER — DOXYCYCLINE HYCLATE 100 MG PO CAPS: 100.0000 mg | ORAL_CAPSULE | Freq: Two times a day (BID) | ORAL | 11 refills | Status: DC

## 2020-12-18 NOTE — Assessment & Plan Note (Signed)
I told him that he has chronic and probably an curable MRSA infection and needs to be on lifelong suppressive antibiotic therapy.  I instructed him to stop doxycycline now but restart it when he completes daptomycin therapy in 1 week.  He will follow-up in 6 weeks.

## 2020-12-18 NOTE — Progress Notes (Signed)
Mark [name] for Infectious Disease  Patient Active Problem List   Diagnosis Date Noted   MRSA bacteremia 09/21/2018    Priority: High   Infection of prosthetic right knee joint ([place]) 09/20/2018    Priority: High   Epidural abscess 09/20/2018    Priority: High   S/P right TKA 05/02/2013    Priority: High   Septic joint of right knee joint ([place]) 11/08/2020   S/P right TKA reimplantation 08/02/2020   Infection of total left knee replacement ([place]) 03/20/2020   Cirrhosis of liver without ascites ([place])    AKI (acute kidney injury) ([place])    Bacteremia    Spinal stenosis of lumbar region without neurogenic claudication    Aspiration pneumonia ([place]) 09/24/2018   Abscess in epidural space of lumbar spine    Anemia of chronic disease 09/20/2018   Hypoalbuminemia 09/20/2018   Hypoglycemia without diagnosis of diabetes mellitus 09/20/2018   Hyperkalemia 05/18/2018   Edentulous 05/11/2018   Pancytopenia ([place]) 05/10/2018   CAD (coronary artery disease) 05/10/2018   Hepatic encephalopathy ([place]) 05/10/2018   Alcohol abuse 05/10/2018   GERD (gastroesophageal reflux disease) 05/10/2018   Duodenal ulcer    Renal failure    Acute on chronic anemia    Hypotension 06/17/2017   Hyponatremia 06/17/2017   Leg edema, right 82/42/3536   Acute metabolic encephalopathy 14/43/1540   Anemia, iron deficiency    Benign neoplasm of ascending colon    Hemorrhoids    Portal hypertensive gastropathy ([name])    Gastritis and gastroduodenitis    Esophageal varices without bleeding ([place])    H/O: CVA (cerebrovascular accident) 12/01/2013   ACS (acute coronary syndrome) ([place]) 12/01/2013   Anasarca 12/01/2013   Polysubstance abuse ([place]) 12/01/2013   CKD (chronic kidney disease) stage 3, GFR 30-59 ml/min ([place]) 12/01/2013   Murmur 04/14/2013   Right inguinal hernia 12/26/2010   Cirrhosis, alcoholic ([place]) 08/67/6195    Patient's Medications  New Prescriptions   No medications on file   Previous Medications   CELECOXIB (CELEBREX) 200 MG CAPSULE    Take 200 mg by mouth daily.   DOCUSATE SODIUM (COLACE) 100 MG CAPSULE    Take 1 capsule (100 mg total) by mouth 2 (two) times daily.   FERROUS SULFATE 325 (65 FE) MG TABLET    Take 1 tablet (325 mg total) by mouth 2 (two) times daily with a meal. Take for two weeks as tolerated.   FOLIC ACID (FOLVITE) 1 MG TABLET    TAKE 1 TABLET(1 MG) BY MOUTH DAILY   FUROSEMIDE (LASIX) 20 MG TABLET    TAKE 1 TABLET(20 MG) BY MOUTH TWICE DAILY   METHOCARBAMOL (ROBAXIN) 500 MG TABLET    Take 1 tablet (500 mg total) by mouth every 6 (six) hours as needed for muscle spasms.   NADOLOL (CORGARD) 20 MG TABLET    TAKE 1 TABLET BY MOUTH EVERY DAY   OXYCODONE (OXY IR/ROXICODONE) 5 MG IMMEDIATE RELEASE TABLET    Take 1-2 tablets (5-10 mg total) by mouth every 4 (four) hours as needed for severe pain.   PANTOPRAZOLE (PROTONIX) 40 MG TABLET    TAKE 1 TABLET BY MOUTH EVERY DAY   POLYETHYLENE GLYCOL (MIRALAX / GLYCOLAX) 17 G PACKET    Take 17 g by mouth daily.  Modified Medications   Modified Medication Previous Medication   DAPTOMYCIN (CUBICIN) IVPB daptomycin (CUBICIN) IVPB      Inject 500 mg into the vein daily. Indication:  Prosthetic joint  infection First Dose: Yes Last Day of Therapy:  12/26/2020 Labs - Once weekly:  CBC/D, BMP, and CPK Labs - Every other week:  ESR and CRP Method of administration: IV Push Method of administration may be changed at the discretion of home infusion pharmacist based upon assessment of the patient and/or caregiver's ability to self-administer the medication ordered.    Inject 500 mg into the vein daily. Indication:  Prosthetic joint infection First Dose: Yes Last Day of Therapy:  12/26/2020 Labs - Once weekly:  CBC/D, BMP, and CPK Labs - Every other week:  ESR and CRP Method of administration: IV Push Method of administration may be changed at the discretion of home infusion pharmacist based upon assessment of the  patient and/or caregiver's ability to self-administer the medication ordered.   DOXYCYCLINE (VIBRAMYCIN) 100 MG CAPSULE doxycycline (VIBRAMYCIN) 100 MG capsule      Take 1 capsule (100 mg total) by mouth 2 (two) times daily.    Take 100 mg by mouth 2 (two) times daily.  Discontinued Medications   No medications on file    Subjective: Mark [name] is in for his routine hospital follow-up visit.  He has had recurrent bacteremia.  He had group A strep bacteremia in February 2019, MSSA bacteremia in January 2020 and MRSA bacteremia in May 2020.  His last bacteremia was complicated by thoracic spine infection.  He was supposed to be taking chronic doxycycline ever since that hospitalization.  However, he stopped taking it sometime last year.  He is not exactly sure when or why he stopped it.   He began having increased pain, swelling and "popping" in his prosthetic right knee last fall.  He thinks that this started after he quit doxycycline.  He cannot recall who replaced his knee about 8 years ago.  He saw Dr. Lindwood Qua who told him that the prosthesis was infected and loose.  He said that fluid was drained off of the knee.  It was reported the cultures grew MRSA susceptible to doxycycline and daptomycin.  He was admitted to the hospital in late November and underwent resection arthroplasty and spacer placement.  No operative cultures were obtained. He has chronic renal insufficiency.  My partner discharged him on IV daptomycin and he completed 8 weeks of therapy on 05/02/2019 before switching back to chronic, suppressive oral doxycycline.    Unfortunately he stopped taking the doxycycline again.  His wife says that he was off about 3 weeks before he started having recurrent pain and swelling in his right prosthetic knee.  He was hospitalized again and underwent incision and drainage with polyexchange on 11/13/2020.  Cultures grew MRSA again which was still susceptible to doxycycline.  He has now completed 5 weeks  of IV daptomycin.  He has not had any problems tolerating his PICC or antibiotics.  He is still having his chronic back and joint pain but overall is feeling better.  He is making progress with physical therapy.  He has also been taking his doxycycline along with daptomycin.  Review of Systems: Review of Systems  Constitutional:  Negative for chills, diaphoresis and fever.  Gastrointestinal:  Negative for abdominal pain, diarrhea, nausea and vomiting.  Musculoskeletal:  Positive for back pain and joint pain.   Past Medical History:  Diagnosis Date   Alcohol abuse    Anemia    Arthritis    Ascites    Cirrhosis ([place])    Coffee ground emesis    Dehydration 06/17/2017   Febrile illness  GERD (gastroesophageal reflux disease)    Heart murmur    History of blood transfusion    Hyperlipidemia    Hypertension    Leg swelling    Myocardial infarction ([place]) 2012   Preop cardiovascular exam 04/14/2013   Sepsis ([place]) 06/17/2017   Septic shock ([place]) 06/18/2017   SIRS (systemic inflammatory response syndrome) ([place]) 07/11/2017   Stroke ([place]) 2012   no deficits   Thrombocytopenia ([name])     Social History   Tobacco Use   Smoking status: Some Days    Packs/day: 0.50    Years: 10.00    Pack years: 5.00    Types: Cigarettes    Last attempt to quit: 01/05/2016    Years since quitting: 4.9   Smokeless tobacco: Never   Tobacco comments:    4 cigarettes a day  Vaping Use   Vaping Use: Never used  Substance Use Topics   Alcohol use: Yes    Comment: 2-3 beers/day   Drug use: Yes    Types: Marijuana    Comment: last smoked marijuana 10-22-20    Family History  Problem Relation Age of Onset   Hypertension Father    Diabetes Father    Dementia Mother    Lupus Sister     No Known Allergies  Objective: Vitals:   12/18/20 1417  BP: 122/67  Pulse: 82  Temp: 98.4 F (36.9 C)  TempSrc: Oral  SpO2: 99%   There is no height or weight on file to calculate BMI.  Physical  Exam Constitutional:      Comments: He is cantankerous as usual.  Cardiovascular:     Rate and Rhythm: Normal rate.  Pulmonary:     Effort: Pulmonary effort is normal.  Musculoskeletal:     Comments: His right knee incision has healed nicely.  He has diffuse swelling of his knee but no unusual warmth or redness.  He has good range of motion.  He walks with the aid of a cane.  Psychiatric:        Mood and Affect: Mood normal.    Lab Results Sed Rate  Date Value  11/08/2020 >140 mm/hr (H)  06/12/2020 43 mm/h (H)  03/21/2020 90 mm/hr (H)   CRP  Date Value  11/08/2020 3.6 mg/dL (H)  06/12/2020 1.6 mg/L  03/21/2020 0.6 mg/dL      Problem List Items Addressed This Visit       High   Infection of prosthetic right knee joint ([place])    I told him that he has chronic and probably an curable MRSA infection and needs to be on lifelong suppressive antibiotic therapy.  I instructed him to stop doxycycline now but restart it when he completes daptomycin therapy in 1 week.  He will follow-up in 6 weeks.      Relevant Medications   daptomycin (CUBICIN) IVPB     Michel Bickers, MD [facility] [place] [phone] pager   [phone] cell 12/18/2020, 2:53 PM

## 2020-12-18 NOTE — Telephone Encounter (Signed)
Verbal orders given to [facility] Pharmacist with [facility] - Per Dr.Campbell - Stop Daptomycin and Pull PICC line on 12/26/2020. Jeani Hawking repeated orders back and verbalized his understanding.    [place], CMA

## 2020-12-20 ENCOUNTER — Encounter (HOSPITAL_BASED_OUTPATIENT_CLINIC_OR_DEPARTMENT_OTHER): Payer: Self-pay

## 2020-12-20 ENCOUNTER — Ambulatory Visit (HOSPITAL_BASED_OUTPATIENT_CLINIC_OR_DEPARTMENT_OTHER): Admit: 2020-12-20 | Payer: Medicare (Managed Care) | Admitting: Surgery

## 2020-12-20 SURGERY — REPAIR, HERNIA, INGUINAL, ADULT
Anesthesia: General | Laterality: Left

## 2020-12-21 DIAGNOSIS — M25561 Pain in right knee: Secondary | ICD-10-CM | POA: Diagnosis not present

## 2020-12-22 DIAGNOSIS — B9562 Methicillin resistant Staphylococcus aureus infection as the cause of diseases classified elsewhere: Secondary | ICD-10-CM | POA: Diagnosis not present

## 2020-12-22 DIAGNOSIS — T8454XA Infection and inflammatory reaction due to internal left knee prosthesis, initial encounter: Secondary | ICD-10-CM | POA: Diagnosis not present

## 2020-12-22 DIAGNOSIS — M00861 Arthritis due to other bacteria, right knee: Secondary | ICD-10-CM | POA: Diagnosis not present

## 2020-12-22 DIAGNOSIS — R7881 Bacteremia: Secondary | ICD-10-CM | POA: Diagnosis not present

## 2020-12-22 DIAGNOSIS — M009 Pyogenic arthritis, unspecified: Secondary | ICD-10-CM | POA: Diagnosis not present

## 2020-12-24 DIAGNOSIS — B9562 Methicillin resistant Staphylococcus aureus infection as the cause of diseases classified elsewhere: Secondary | ICD-10-CM | POA: Diagnosis not present

## 2020-12-24 DIAGNOSIS — T8454XA Infection and inflammatory reaction due to internal left knee prosthesis, initial encounter: Secondary | ICD-10-CM | POA: Diagnosis not present

## 2020-12-24 DIAGNOSIS — M00861 Arthritis due to other bacteria, right knee: Secondary | ICD-10-CM | POA: Diagnosis not present

## 2020-12-24 DIAGNOSIS — R7881 Bacteremia: Secondary | ICD-10-CM | POA: Diagnosis not present

## 2020-12-24 DIAGNOSIS — M009 Pyogenic arthritis, unspecified: Secondary | ICD-10-CM | POA: Diagnosis not present

## 2020-12-24 DIAGNOSIS — T8451XA Infection and inflammatory reaction due to internal right hip prosthesis, initial encounter: Secondary | ICD-10-CM | POA: Diagnosis not present

## 2020-12-26 DIAGNOSIS — M00861 Arthritis due to other bacteria, right knee: Secondary | ICD-10-CM | POA: Diagnosis not present

## 2020-12-26 DIAGNOSIS — M009 Pyogenic arthritis, unspecified: Secondary | ICD-10-CM | POA: Diagnosis not present

## 2020-12-26 DIAGNOSIS — R7881 Bacteremia: Secondary | ICD-10-CM | POA: Diagnosis not present

## 2020-12-26 DIAGNOSIS — T8454XA Infection and inflammatory reaction due to internal left knee prosthesis, initial encounter: Secondary | ICD-10-CM | POA: Diagnosis not present

## 2020-12-26 DIAGNOSIS — M25561 Pain in right knee: Secondary | ICD-10-CM | POA: Diagnosis not present

## 2020-12-26 DIAGNOSIS — B9562 Methicillin resistant Staphylococcus aureus infection as the cause of diseases classified elsewhere: Secondary | ICD-10-CM | POA: Diagnosis not present

## 2020-12-28 DIAGNOSIS — Z4789 Encounter for other orthopedic aftercare: Secondary | ICD-10-CM | POA: Diagnosis not present

## 2021-01-08 DIAGNOSIS — M25561 Pain in right knee: Secondary | ICD-10-CM | POA: Diagnosis not present

## 2021-01-09 ENCOUNTER — Telehealth: Payer: Self-pay

## 2021-01-09 NOTE — Telephone Encounter (Signed)
Called patient to discuss procedure concerns. Left voicemail requesting a call back.   Sidrah Harden Lorita Officer, RN

## 2021-01-09 NOTE — Telephone Encounter (Signed)
-----   Message from Michel Bickers, MD sent at 01/09/2021  4:01 PM EDT ----- Regarding: RE: I can't really say without knowing what procedure he is referring to.  John ----- Message ----- From: Marva Panda Sent: 01/09/2021   3:52 PM EDT To: Michel Bickers, MD, [facility] Triage Nurse Pool  Patient called wanting to know if it was safe for him to get a procedure done, that another doctor is recommending.Marland Kitchen He stated he wanted to hear the "OKAY" from Dr. Megan Salon, told him I would get a message sent over!

## 2021-01-15 NOTE — Telephone Encounter (Signed)
Patient returned call stating that he is considering a hernia repair and wanted confirmation from Dr. Megan Salon that it's ok to pursue.   Davit Vassar Lorita Officer, RN

## 2021-01-17 ENCOUNTER — Encounter: Payer: Self-pay | Admitting: Internal Medicine

## 2021-01-17 ENCOUNTER — Ambulatory Visit (INDEPENDENT_AMBULATORY_CARE_PROVIDER_SITE_OTHER): Payer: Medicare (Managed Care) | Admitting: Internal Medicine

## 2021-01-17 ENCOUNTER — Other Ambulatory Visit: Payer: Self-pay

## 2021-01-17 DIAGNOSIS — T8453XD Infection and inflammatory reaction due to internal right knee prosthesis, subsequent encounter: Secondary | ICD-10-CM | POA: Diagnosis not present

## 2021-01-17 NOTE — Progress Notes (Signed)
Virtual Visit via Telephone Note  I connected with Mark [name] on 01/17/21 at  9:00 AM EDT by telephone and verified that I am speaking with the correct person using two identifiers.  Location: Patient: Home Provider: [facility]   I discussed the limitations, risks, security and privacy concerns of performing an evaluation and management service by telephone and the availability of in person appointments. I also discussed with the patient that there may be a patient responsible charge related to this service. The patient expressed understanding and agreed to proceed.   History of Present Illness: I called and spoke with Mark [name] by phone today.  He continues to take doxycycline for his chronic MRSA right prosthetic knee infection.  He says that he is making slow progress and is able to walk a little bit better now.  He is not having any problems tolerating doxycycline.  He has seen Dr. Ninfa Linden who has recommended a left inguinal herniorrhaphy with mesh repair.  Amarrion wants to know if I think that it is okay for him to proceed with surgery.   Observations/Objective: Sed Rate  Date Value  11/08/2020 >140 mm/hr (H)  06/12/2020 43 mm/h (H)  03/21/2020 90 mm/hr (H)   CRP  Date Value  11/08/2020 3.6 mg/dL (H)  06/12/2020 1.6 mg/L  03/21/2020 0.6 mg/dL     Assessment and Plan: He will continue chronic, suppressive doxycycline therapy.  I told him that it is okay with me for him to proceed with his hernia repair.  Follow Up Instructions: Continue doxycycline indefinitely Follow-up next month   I discussed the assessment and treatment plan with the patient. The patient was provided an opportunity to ask questions and all were answered. The patient agreed with the plan and demonstrated an understanding of the instructions.   The patient was advised to call back or seek an in-person evaluation if the symptoms worsen or if the condition fails to improve as anticipated.  I provided 15  minutes of non-face-to-face time during this encounter.   Michel Bickers, MD

## 2021-01-18 DIAGNOSIS — M25561 Pain in right knee: Secondary | ICD-10-CM | POA: Diagnosis not present

## 2021-01-28 ENCOUNTER — Telehealth: Payer: Self-pay

## 2021-01-28 NOTE — Telephone Encounter (Signed)
Patient called wanting to know if it is okay with him to proceed with hernia repair. RN relayed that per Dr. Hale Bogus last office note that he is okay with him proceeding with the surgery. Let him know that his surgeon's office can fax over surgical clearance request to our office. Patient verbalized understanding and has no further questions.   Beryle Flock, RN

## 2021-01-29 DIAGNOSIS — H43822 Vitreomacular adhesion, left eye: Secondary | ICD-10-CM | POA: Diagnosis not present

## 2021-01-29 DIAGNOSIS — H40023 Open angle with borderline findings, high risk, bilateral: Secondary | ICD-10-CM | POA: Diagnosis not present

## 2021-01-29 DIAGNOSIS — H25813 Combined forms of age-related cataract, bilateral: Secondary | ICD-10-CM | POA: Diagnosis not present

## 2021-01-29 DIAGNOSIS — H16223 Keratoconjunctivitis sicca, not specified as Sjogren's, bilateral: Secondary | ICD-10-CM | POA: Diagnosis not present

## 2021-01-30 ENCOUNTER — Telehealth: Payer: Self-pay

## 2021-01-30 NOTE — Progress Notes (Signed)
Surgical Instructions    Your procedure is scheduled on Friday, October 14 th, 2022.   Report to [facility] Entrance "A" at 13:45 P.M., then check in with the Admitting office.  Call this number if you have problems the morning of surgery:  [phone]   If you have any questions prior to your surgery date call [phone]: Open Monday-Friday 8am-4pm    Remember:  Do not eat after midnight the night before your surgery  You may drink clear liquids until 12:45 the day of your surgery.   Clear liquids allowed are: Water, Non-Citrus Juices (without pulp), Carbonated Beverages, Clear Tea, Black Coffee ONLY (NO MILK, CREAM OR POWDERED CREAMER of any kind), and Gatorade     Take these medicines the morning of surgery with A SIP OF WATER   doxycycline (VIBRA-TABS)  pantoprazole (PROTONIX)  Oxycodone HCl - if needed  Follow your surgeon's instructions on when to stop Aspirin.  If no instructions were given by your surgeon then you will need to call the office to get those instructions.     As of today, STOP taking any Aspirin (unless otherwise instructed by your surgeon) Aleve, Naproxen, Ibuprofen, Motrin, Advil, Goody's, BC's, all herbal medications, fish oil, and all vitamins.   After your COVID test   You are not required to quarantine however you are required to wear a well-fitting mask when you are out and around people not in your household.  If your mask becomes wet or soiled, replace with a new one.  Wash your hands often with soap and water for 20 seconds or clean your hands with an alcohol-based hand sanitizer that contains at least 60% alcohol.  Do not share personal items.  Notify your provider: if you are in close contact with someone who has COVID  or if you develop a fever of 100.4 or greater, sneezing, cough, sore throat, shortness of breath or body aches.     The day of surgery:          Do not wear jewelry  Do not wear lotions, powders, colognes, or  deodorant. Men may shave face and neck. Do not bring valuables to the hospital.              [facility] is not responsible for any belongings or valuables.  Do NOT Smoke (Tobacco/Vaping)  24 hours prior to your procedure If you use a CPAP at night, you may bring your mask for your overnight stay.   Contacts, glasses, dentures or bridgework may not be worn into surgery, please bring cases for these belongings   For patients admitted to the hospital, discharge time will be determined by your treatment team.   Patients discharged the day of surgery will not be allowed to drive home, and someone needs to stay with them for 24 hours.  NO VISITORS WILL BE ALLOWED IN PRE-OP WHERE PATIENTS ARE PREPPED FOR SURGERY.  ONLY 1 SUPPORT PERSON MAY BE PRESENT IN THE WAITING ROOM WHILE YOU ARE IN SURGERY.  IF YOU ARE TO BE ADMITTED, ONCE YOU ARE IN YOUR ROOM YOU WILL BE ALLOWED TWO (2) VISITORS. 1 (ONE) VISITOR MAY STAY OVERNIGHT BUT MUST ARRIVE TO THE ROOM BY 8pm.  Minor children may have two parents present. Special consideration for safety and communication needs will be reviewed on a case by case basis.  Special instructions:    Oral Hygiene is also important to reduce your risk of infection.  Remember - BRUSH YOUR TEETH THE MORNING  OF SURGERY WITH YOUR REGULAR TOOTHPASTE   [place]- Preparing For Surgery  Before surgery, you can play an important role. Because skin is not sterile, your skin needs to be as free of germs as possible. You can reduce the number of germs on your skin by washing with CHG (chlorahexidine gluconate) Soap before surgery.  CHG is an antiseptic cleaner which kills germs and bonds with the skin to continue killing germs even after washing.     Please do not use if you have an allergy to CHG or antibacterial soaps. If your skin becomes reddened/irritated stop using the CHG.  Do not shave (including legs and underarms) for at least 48 hours prior to first CHG shower. It is OK  to shave your face.  Please follow these instructions carefully.     Shower the NIGHT BEFORE SURGERY and the MORNING OF SURGERY with CHG Soap.   If you chose to wash your hair, wash your hair first as usual with your normal shampoo. After you shampoo, rinse your hair and body thoroughly to remove the shampoo.  Then [employer] and genitals (private parts) with your normal soap and rinse thoroughly to remove soap.  After that Use CHG Soap as you would any other liquid soap. You can apply CHG directly to the skin and wash gently with a scrungie or a clean washcloth.   Apply the CHG Soap to your body ONLY FROM THE NECK DOWN.  Do not use on open wounds or open sores. Avoid contact with your eyes, ears, mouth and genitals (private parts). Wash Face and genitals (private parts)  with your normal soap.   Wash thoroughly, paying special attention to the area where your surgery will be performed.  Thoroughly rinse your body with warm water from the neck down.  DO NOT shower/wash with your normal soap after using and rinsing off the CHG Soap.  Pat yourself dry with a CLEAN TOWEL.  Wear CLEAN PAJAMAS to bed the night before surgery  Place CLEAN SHEETS on your bed the night before your surgery  DO NOT SLEEP WITH PETS.   Day of Surgery:  Take a shower with CHG soap. Wear Clean/Comfortable clothing the morning of surgery Do not apply any deodorants/lotions.   Remember to brush your teeth WITH YOUR REGULAR TOOTHPASTE.   Please read over the following fact sheets that you were given.

## 2021-01-31 ENCOUNTER — Encounter (HOSPITAL_COMMUNITY)
Admission: RE | Admit: 2021-01-31 | Discharge: 2021-01-31 | Disposition: A | Discharge status: Home / Self Care | Payer: Medicare (Managed Care) | Source: Ambulatory Visit | Attending: Surgery | Admitting: Surgery

## 2021-01-31 ENCOUNTER — Ambulatory Visit: Payer: Medicare (Managed Care) | Admitting: Internal Medicine

## 2021-01-31 ENCOUNTER — Other Ambulatory Visit: Payer: Self-pay

## 2021-01-31 ENCOUNTER — Encounter (HOSPITAL_COMMUNITY): Payer: Self-pay

## 2021-01-31 ENCOUNTER — Other Ambulatory Visit: Payer: Self-pay | Admitting: Surgery

## 2021-01-31 DIAGNOSIS — N183 Chronic kidney disease, stage 3 unspecified: Secondary | ICD-10-CM | POA: Diagnosis not present

## 2021-01-31 DIAGNOSIS — Z8614 Personal history of Methicillin resistant Staphylococcus aureus infection: Secondary | ICD-10-CM | POA: Insufficient documentation

## 2021-01-31 DIAGNOSIS — Z01812 Encounter for preprocedural laboratory examination: Secondary | ICD-10-CM | POA: Insufficient documentation

## 2021-01-31 DIAGNOSIS — K219 Gastro-esophageal reflux disease without esophagitis: Secondary | ICD-10-CM | POA: Diagnosis not present

## 2021-01-31 DIAGNOSIS — Z79899 Other long term (current) drug therapy: Secondary | ICD-10-CM | POA: Diagnosis not present

## 2021-01-31 DIAGNOSIS — R011 Cardiac murmur, unspecified: Secondary | ICD-10-CM | POA: Diagnosis not present

## 2021-01-31 DIAGNOSIS — K4090 Unilateral inguinal hernia, without obstruction or gangrene, not specified as recurrent: Secondary | ICD-10-CM | POA: Diagnosis not present

## 2021-01-31 DIAGNOSIS — Z96651 Presence of right artificial knee joint: Secondary | ICD-10-CM | POA: Insufficient documentation

## 2021-01-31 DIAGNOSIS — K766 Portal hypertension: Secondary | ICD-10-CM | POA: Insufficient documentation

## 2021-01-31 DIAGNOSIS — I252 Old myocardial infarction: Secondary | ICD-10-CM | POA: Insufficient documentation

## 2021-01-31 DIAGNOSIS — Z7982 Long term (current) use of aspirin: Secondary | ICD-10-CM | POA: Insufficient documentation

## 2021-01-31 DIAGNOSIS — Z8673 Personal history of transient ischemic attack (TIA), and cerebral infarction without residual deficits: Secondary | ICD-10-CM | POA: Insufficient documentation

## 2021-01-31 DIAGNOSIS — I129 Hypertensive chronic kidney disease with stage 1 through stage 4 chronic kidney disease, or unspecified chronic kidney disease: Secondary | ICD-10-CM | POA: Insufficient documentation

## 2021-01-31 DIAGNOSIS — D696 Thrombocytopenia, unspecified: Secondary | ICD-10-CM | POA: Insufficient documentation

## 2021-01-31 DIAGNOSIS — K7031 Alcoholic cirrhosis of liver with ascites: Secondary | ICD-10-CM | POA: Diagnosis not present

## 2021-01-31 DIAGNOSIS — F17210 Nicotine dependence, cigarettes, uncomplicated: Secondary | ICD-10-CM | POA: Insufficient documentation

## 2021-01-31 LAB — COMPREHENSIVE METABOLIC PANEL
ALT: 10 U/L (ref 0–44)
AST: 26 U/L (ref 15–41)
Albumin: 2.7 g/dL — ABNORMAL LOW (ref 3.5–5.0)
Alkaline Phosphatase: 107 U/L (ref 38–126)
Anion gap: 10 (ref 5–15)
BUN: 21 mg/dL (ref 8–23)
CO2: 23 mmol/L (ref 22–32)
Calcium: 8.6 mg/dL — ABNORMAL LOW (ref 8.9–10.3)
Chloride: 100 mmol/L (ref 98–111)
Creatinine, Ser: 1.16 mg/dL (ref 0.61–1.24)
GFR, Estimated: >60 mL/min (ref >60)
Glucose, Bld: 108 mg/dL — ABNORMAL HIGH (ref 70–99)
Potassium: 4.3 mmol/L (ref 3.5–5.1)
Sodium: 133 mmol/L — ABNORMAL LOW (ref 135–145)
Total Bilirubin: 0.7 mg/dL (ref 0.3–1.2)
Total Protein: 7.7 g/dL (ref 6.5–8.1)

## 2021-01-31 LAB — CBC
HCT: 24.6 % — ABNORMAL LOW (ref 39.0–52.0)
Hemoglobin: 8.2 g/dL — ABNORMAL LOW (ref 13.0–17.0)
MCH: 32 pg (ref 26.0–34.0)
MCHC: 33.3 g/dL (ref 30.0–36.0)
MCV: 96.1 fL (ref 80.0–100.0)
Platelets: 153 10*3/uL (ref 150–400)
RBC: 2.56 MIL/uL — ABNORMAL LOW (ref 4.22–5.81)
RDW: 13.4 % (ref 11.5–15.5)
WBC: 3.9 10*3/uL — ABNORMAL LOW (ref 4.0–10.5)
nRBC: 0 % (ref 0.0–0.2)

## 2021-01-31 NOTE — Progress Notes (Signed)
Surgical Instructions    Your procedure is scheduled on Friday, October 14 th, 2022.   Report to [facility] Main Entrance "A" at 1:45 P.M., then check in with the Admitting office.  Call this number if you have problems the morning of surgery:  [phone]   If you have any questions prior to your surgery date call [phone]: Open Monday-Friday 8am-4pm    Remember:  Do not eat after midnight the night before your surgery  You may drink clear liquids until 12:45 the day of your surgery.   Clear liquids allowed are: Water, Non-Citrus Juices (without pulp), Carbonated Beverages, Clear Tea, Black Coffee ONLY (NO MILK, CREAM OR POWDERED CREAMER of any kind), and Gatorade     Take these medicines the morning of surgery with A SIP OF WATER   doxycycline (VIBRA-TABS)  pantoprazole (PROTONIX)  Oxycodone HCl - if needed  Follow your surgeon's instructions on when to stop Aspirin.  If no instructions were given by your surgeon then you will need to call the office to get those instructions.     As of today, STOP taking any  (unless otherwise instructed by your surgeon) Aleve, Naproxen, Ibuprofen, Motrin, Advil, Goody's, BC's, all herbal medications, fish oil, and all vitamins.   After your COVID test   You are not required to quarantine however you are required to wear a well-fitting mask when you are out and around people not in your household.  If your mask becomes wet or soiled, replace with a new one.  Wash your hands often with soap and water for 20 seconds or clean your hands with an alcohol-based hand sanitizer that contains at least 60% alcohol.  Do not share personal items.  Notify your provider: if you are in close contact with someone who has COVID  or if you develop a fever of 100.4 or greater, sneezing, cough, sore throat, shortness of breath or body aches.     The day of surgery:          Do not wear jewelry  Do not wear lotions, powders, colognes, or  deodorant. Men may shave face and neck. Do not bring valuables to the hospital.              [facility] is not responsible for any belongings or valuables.  Do NOT Smoke (Tobacco/Vaping)  24 hours prior to your procedure If you use a CPAP at night, you may bring your mask for your overnight stay.   Contacts, glasses, dentures or bridgework may not be worn into surgery, please bring cases for these belongings   For patients admitted to the hospital, discharge time will be determined by your treatment team.   Patients discharged the day of surgery will not be allowed to drive home, and someone needs to stay with them for 24 hours.  NO VISITORS WILL BE ALLOWED IN PRE-OP WHERE PATIENTS ARE PREPPED FOR SURGERY.  ONLY 1 SUPPORT PERSON MAY BE PRESENT IN THE WAITING ROOM WHILE YOU ARE IN SURGERY.  IF YOU ARE TO BE ADMITTED, ONCE YOU ARE IN YOUR ROOM YOU WILL BE ALLOWED TWO (2) VISITORS. 1 (ONE) VISITOR MAY STAY OVERNIGHT BUT MUST ARRIVE TO THE ROOM BY 8pm.  Minor children may have two parents present. Special consideration for safety and communication needs will be reviewed on a case by case basis.  Special instructions:    Oral Hygiene is also important to reduce your risk of infection.  Remember - BRUSH YOUR TEETH THE MORNING  OF SURGERY WITH YOUR REGULAR TOOTHPASTE   [facility]- Preparing For Surgery  Before surgery, you can play an important role. Because skin is not sterile, your skin needs to be as free of germs as possible. You can reduce the number of germs on your skin by washing with CHG (chlorahexidine gluconate) Soap before surgery.  CHG is an antiseptic cleaner which kills germs and bonds with the skin to continue killing germs even after washing.     Please do not use if you have an allergy to CHG or antibacterial soaps. If your skin becomes reddened/irritated stop using the CHG.  Do not shave (including legs and underarms) for at least 48 hours prior to first CHG shower. It is OK  to shave your face.  Please follow these instructions carefully.     Shower the NIGHT BEFORE SURGERY and the MORNING OF SURGERY with CHG Soap.   If you chose to wash your hair, wash your hair first as usual with your normal shampoo. After you shampoo, rinse your hair and body thoroughly to remove the shampoo.  Then [employer] and genitals (private parts) with your normal soap and rinse thoroughly to remove soap.  After that Use CHG Soap as you would any other liquid soap. You can apply CHG directly to the skin and wash gently with a scrungie or a clean washcloth.   Apply the CHG Soap to your body ONLY FROM THE NECK DOWN.  Do not use on open wounds or open sores. Avoid contact with your eyes, ears, mouth and genitals (private parts). Wash Face and genitals (private parts)  with your normal soap.   Wash thoroughly, paying special attention to the area where your surgery will be performed.  Thoroughly rinse your body with warm water from the neck down.  DO NOT shower/wash with your normal soap after using and rinsing off the CHG Soap.  Pat yourself dry with a CLEAN TOWEL.  Wear CLEAN PAJAMAS to bed the night before surgery  Place CLEAN SHEETS on your bed the night before your surgery  DO NOT SLEEP WITH PETS.   Day of Surgery: Take a shower with CHG soap. Wear Clean/Comfortable clothing the morning of surgery Do not apply any deodorants/lotions.   Remember to brush your teeth WITH YOUR REGULAR TOOTHPASTE.   Please read over the following fact sheets that you were given.

## 2021-01-31 NOTE — Telephone Encounter (Signed)
error 

## 2021-01-31 NOTE — Progress Notes (Signed)
[employer] and called office with hemoglobin result.

## 2021-01-31 NOTE — Progress Notes (Signed)
PCP - Grier Mitts Cardiologist - none per patient Gastroenterologist: Owens Loffler Infectious disease doctor: Michel Bickers  PPM/ICD - denies   Chest x-ray - n/a EKG - 11/08/20 Stress Test - denies ECHO - 09/22/18 Cardiac Cath - denies  Sleep Study - denies   No diabetes  Follow your surgeon's instructions on when to stop Aspirin.  If no instructions were given by your surgeon then you will need to call the office to get those instructions.      As of today, STOP taking any  (unless otherwise instructed by your surgeon) Aleve, Naproxen, Ibuprofen, Motrin, Advil, Goody's, BC's, all herbal medications, fish oil, and all vitamins.  ERAS Protcol -yes PRE-SURGERY Ensure or G2- no  COVID TEST- ambulatory surgery, not needed   Anesthesia review: yes, substance abuse.   Patient denies shortness of breath, fever, cough and chest pain at PAT appointment   All instructions explained to the patient, with a verbal understanding of the material. Patient agrees to go over the instructions while at home for a better understanding. Patient also instructed to self quarantine after being tested for COVID-19. The opportunity to ask questions was provided.

## 2021-02-01 ENCOUNTER — Encounter (HOSPITAL_COMMUNITY): Payer: Self-pay

## 2021-02-01 NOTE — Anesthesia Preprocedure Evaluation (Addendum)
Anesthesia Evaluation  Patient identified by MRN, date of birth, ID band Patient awake    Reviewed: Allergy & Precautions, NPO status , Patient's Chart, lab work & pertinent test results  History of Anesthesia Complications Negative for: history of anesthetic complications  Airway Mallampati: II  TM Distance: >3 FB Neck ROM: Full    Dental  (+) Edentulous Upper, Edentulous Lower   Pulmonary neg pulmonary ROS, Current Smoker,    Pulmonary exam normal        Cardiovascular hypertension, Normal cardiovascular exam+ dysrhythmias Supra Ventricular Tachycardia    Echo 09/22/2018 IMPRESSIONS  1. The left ventricle has normal systolic function with an ejection fraction of 60-65%. The cavity size was normal. There is mildly increased left ventricular wall thickness. Left ventricular diastolic parameters were normal.  2. The right ventricle has normal systolic function. The cavity was normal. There is no increase in right ventricular wall thickness.  3. Left atrial size was severely dilated.  4. The mitral valve is normal in structure. Mitral valve regurgitation is mild by color flow Doppler. No evidence of mitral valve stenosis. 5. Mild thickening of the aortic valve. Mild calcification of the aortic valve. No stenosis of the aortic valve.    Neuro/Psych TIA   GI/Hepatic PUD, GERD  ,(+) Cirrhosis   ascites  substance abuse  alcohol use and marijuana use,   Endo/Other  negative endocrine ROS  Renal/GU CRFRenal disease  negative genitourinary   Musculoskeletal negative musculoskeletal ROS (+)   Abdominal   Peds  Hematology  (+) anemia , Hgb 8.2, plts 153   Anesthesia Other Findings  Chronic infected TKA  Reproductive/Obstetrics                          Anesthesia Physical Anesthesia Plan  ASA: 3  Anesthesia Plan: General   Post-op Pain Management: GA combined w/ Regional for post-op pain    Induction: Intravenous  PONV Risk Score and Plan: 1 and Ondansetron, Dexamethasone, Midazolam and Treatment may vary due to age or medical condition  Airway Management Planned: LMA  Additional Equipment: None  Intra-op Plan:   Post-operative Plan: Extubation in OR  Informed Consent: I have reviewed the patients History and Physical, chart, labs and discussed the procedure including the risks, benefits and alternatives for the proposed anesthesia with the patient or authorized representative who has indicated his/her understanding and acceptance.     Dental advisory given  Plan Discussed with:   Anesthesia Plan Comments: (PAT note written 02/01/2021 by Myra Gianotti, PA-C. )      Anesthesia Quick Evaluation

## 2021-02-01 NOTE — Progress Notes (Signed)
Anesthesia Chart Review:  Case: [id number] Date/Time: 02/08/21 1530   Procedure: OPEN LEFT INGUINAL HERNIA REPAIR WITH MESH (Left)   Anesthesia type: General   Pre-op diagnosis: LEFT INGUINAL HERNIA   Location: [facility] ROOM 04 / [facility]   Surgeons: Coralie Keens, MD       DISCUSSION: Patient is a 65 year old male scheduled for the above procedure. Surgery was initially scheduled for August 2022, but it was cancelled because he needed to complete IV antibiotics for recurrent MRSA right TKA. He is now on lifelong suppressive oral doxycycline therapy and has preoperative input by ID (see below)   History includes smoking, HTN, alcoholic liver cirrhosis (with portal hypertension, thrombocytopenia, ascites), polysubstance abuse (tobacco, THC, alcohol), murmur (mild MR 08/2018), TIA (10/2009), GERD, CKD (Stage III), right TKA (1/0/17 complicated by infection, s/p resection of right TKA and placement of antibiotic spacer 03/20/20; reimplantation of right TKA with removal of antibiotic spacer 08/02/20; s/p I&D right knee revision with polyethylene revision 11/13/20), recurrent infections (sepsis with RLE cellulitis, Group A bacteremia 05/2017; MSSA bacteremia 04/2018 likely skin source; lumbar epidural abscess and septic arthritis P1-0 joints complicated by MRSA bacteremia 08/2018). MI in 2012 is listed in history, but not mentioned in cardiology consult by Dr. Johnsie Cancel on 04/14/13 (does mention cardiology evaluation in 2011 by Dr. Leonia Reeves for possible SVT.)  [facility]end admission 11/08/20-11/15/20 for recurrent MRSA septic right TKA. ID had previously recommended lifelong doxycycline for right TKA MRSA infection, s/p removal and replacement, but he had run out 3 weeks prior. He presented with increasing right knee pain and swelling. Arthrocentesis performed in the ED by Ortho, + MRSA. Of note, in the ED he also had transient SVT. His HGB was 5.7, s/p 1 unit PRBC on 11/08/20 & 11/09/20. ID consulted, and PICC line placed  for daptomycin x8 weeks. S/p I&D right knee revision 11/13/20.  Virtual visit with ID Dr. Megan Salon on 01/17/21. He felt it was "okay" from his standpoint for patient to proceed with left inguinal herniorrhaphy with mesh repair.   Last EKG is from 11/08/20 showing SVT at 128 bpm when he presented with severe anemia and recurrent right TKA MRSA infection. He underwent knee surgery on 11/13/20. He is not routinely followed by cardiology (see PROVIDERS below for details). Last echo 08/2018 showed normal LVEF, mild MR. HR 98 bpm with vitals.   Preoperative labs show H/H 8.2/24.6. Except for on HGB 14.5 on 12/22/20, his HGB has not been over 9.3 in the other 25+ results in [facility] since 04/2018. His HGB has been 7.4-9.1 since 11/11/20 (5 different results). He has had a few PLT counts < 100, but overall, no significant thrombocytopenia in the last few years. PLT 121-183 since 7/17/122. Current alcohol use is listed as 2-3 beers/day.   Reviewed above with anesthesiologist Adele Barthel, MD. Anesthesia team to evaluate on the day of surgery. PAT RN did contact Dr. Trevor Mace office regarding HGB results.    VS: BP 122/61   Pulse 98   Temp 37 C (Oral)   Resp 18   Ht 5\' 8"  (1.727 m)   Wt 59 kg   SpO2 99%   BMI 19.78 kg/m    PROVIDERS: [name], MD is PCP  Owens Loffler, MD is GI Michel Bickers, MD is ID Jenkins Rouge, MD is cardiologist, although not routinely followed. First evaluated 04/14/13 for preoperative evaluation for TKA. He then had a  TEE 05/13/18 by Cherlynn Kaiser, MD during admission for MSSA bacteremia. TEE which  did not show infective endocarditis, but did show moderate to severe MR prompting formal consult by Croitoru, Dani Gobble, MD. In his opinion MR appeared moderate. On antibiotic therapy. No treatment recommended for MR, can follow as out-patient. Follow-up TTE 09/22/18 showed only mild MR.   LABS: Preoperative labs noted. See DISCUSSION. (all labs ordered are listed, but only  abnormal results are displayed)  Labs Reviewed  COMPREHENSIVE METABOLIC PANEL - Abnormal; Notable for the following components:      Result Value   Sodium 133 (*)    Glucose, Bld 108 (*)    Calcium 8.6 (*)    Albumin 2.7 (*)    All other components within normal limits  CBC - Abnormal; Notable for the following components:   WBC 3.9 (*)    RBC 2.56 (*)    Hemoglobin 8.2 (*)    HCT 24.6 (*)    All other components within normal limits     EGD 06/22/17: Impression: LA Grade A reflux esophagitis. - Small hiatal hernia. - Portal hypertensive gastropathy, mild and non-bleeding. - Acute ulcerative duodenitis (source of recent hematemesis and blood loss). - Normal second portion of the duodenum. - No specimens collected.   EKG: EKG 11/08/20 (in setting of acute recurrent MRSA TKA infection and acute on chronic anemia with HGB 5.7):  Ventricular rate 128 bpm Supraventricular tachycardia Anteroseptal infarct, old ST depr, consider ischemia, inferior leads When compared with ECG of 03/13/2020, Supraventricular tachycardia has replaced Sinus rhythm ST depression in Inferior leads is now present - possibly rate-related Confirmed by Delora Fuel ([id number]) on 11/08/2020 11:09:20 PM  EKG 03/13/20: Normal sinus rhythm Septal infarct , age undetermined Abnormal ECG No significant change since last tracing Confirmed by Cristopher Peru [phone]) on 03/13/2020 10:40:10 PM   CV: Echo 09/22/2018 IMPRESSIONS   1. The left ventricle has normal systolic function with an ejection fraction of 60-65%. The cavity size was normal. There is mildly increased left ventricular wall thickness. Left ventricular diastolic parameters were normal.   2. The right ventricle has normal systolic function. The cavity was normal. There is no increase in right ventricular wall thickness.   3. Left atrial size was severely dilated.   4. The mitral valve is normal in structure. Mitral valve regurgitation is mild by  color flow Doppler. No evidence of mitral valve stenosis.  5. Mild thickening of the aortic valve. Mild calcification of the aortic valve. No stenosis of the aortic valve.    Past Medical History:  Diagnosis Date   Alcohol abuse    Anemia    Arthritis    Ascites    Cirrhosis ([place])    Coffee ground emesis    Dehydration 06/17/2017   Febrile illness    GERD (gastroesophageal reflux disease)    Heart murmur    History of blood transfusion    Hyperlipidemia    Hypertension    Leg swelling    Myocardial infarction ([place]) 2012   Preop cardiovascular exam 04/14/2013   Sepsis ([place]) 06/17/2017   Septic shock ([place]) 06/18/2017   SIRS (systemic inflammatory response syndrome) ([place]) 07/11/2017   Stroke ([place]) 2012   no deficits   Thrombocytopenia ([place])     Past Surgical History:  Procedure Laterality Date   COLONOSCOPY WITH PROPOFOL N/A 02/07/2016   Procedure: COLONOSCOPY WITH PROPOFOL;  Surgeon: Milus Banister, MD;  Location: [facility] ENDOSCOPY;  Service: Endoscopy;  Laterality: N/A;   ESOPHAGOGASTRODUODENOSCOPY (EGD) WITH PROPOFOL N/A 02/07/2016   Procedure: ESOPHAGOGASTRODUODENOSCOPY (EGD) WITH PROPOFOL;  Surgeon:  Milus Banister, MD;  Location: [facility];  Service: Endoscopy;  Laterality: N/A;   ESOPHAGOGASTRODUODENOSCOPY (EGD) WITH PROPOFOL N/A 06/22/2017   Procedure: ESOPHAGOGASTRODUODENOSCOPY (EGD) WITH PROPOFOL;  Surgeon: Jerene Bears, MD;  Location: [facility] ENDOSCOPY;  Service: Gastroenterology;  Laterality: N/A;   EXCISIONAL TOTAL KNEE ARTHROPLASTY WITH ANTIBIOTIC SPACERS Right 03/20/2020   Procedure: Resection right total knee arthroplasty and placement of antibiotic spacer;  Surgeon: Paralee Cancel, MD;  Location: [facility] ORS;  Service: Orthopedics;  Laterality: Right;  90 mins   HERNIA REPAIR Right    inguinal   IRRIGATION AND DEBRIDEMENT KNEE Right 11/13/2020   Procedure: IRRIGATION AND DEBRIDEMENT KNEE;  Surgeon: Paralee Cancel, MD;  Location: [facility] ORS;  Service: Orthopedics;  Laterality:  Right;   KNEE ARTHROSCOPY     bilateral/  12/14   REIMPLANTATION OF TOTAL KNEE Right 08/02/2020   Procedure: REIMPLANTATION/REVISION OF TOTAL KNEE WITH REMOVAL OF ANTIBIOTIC SPACER;  Surgeon: Paralee Cancel, MD;  Location: [facility] ORS;  Service: Orthopedics;  Laterality: Right;  93mins   TEE WITHOUT CARDIOVERSION N/A 05/13/2018   Procedure: TRANSESOPHAGEAL ECHOCARDIOGRAM (TEE);  Surgeon: [name], MD;  Location: [facility];  Service: Cardiovascular;  Laterality: N/A;   TOTAL KNEE ARTHROPLASTY Right 05/02/2013   Procedure: RIGHT TOTAL KNEE ARTHROPLASTY;  Surgeon: Mauri Pole, MD;  Location: [facility] ORS;  Service: Orthopedics;  Laterality: Right;    MEDICATIONS:  aspirin EC 81 MG tablet   daptomycin (CUBICIN) IVPB   docusate sodium (COLACE) 100 MG capsule   doxycycline (VIBRA-TABS) 100 MG tablet   doxycycline (VIBRAMYCIN) 100 MG capsule   ferrous sulfate 325 (65 FE) MG tablet   folic acid (FOLVITE) 1 MG tablet   furosemide (LASIX) 20 MG tablet   methocarbamol (ROBAXIN) 500 MG tablet   nadolol (CORGARD) 20 MG tablet   oxyCODONE (OXY IR/ROXICODONE) 5 MG immediate release tablet   Oxycodone HCl 10 MG TABS   pantoprazole (PROTONIX) 40 MG tablet   polyethylene glycol (MIRALAX / GLYCOLAX) 17 g packet   No current facility-administered medications for this encounter.   He is no longer on daptomycin. He is listed as not taking Robaxin, nadolol, Oxy IR 5mg , Miralax. He was advised to contact surgeon regarding perioperative ASA instructions.   Myra Gianotti, PA-C Surgical Short Stay/Anesthesiology [facility] Phone [phone] [facility] Phone [phone] 02/01/2021 5:52 PM

## 2021-02-06 ENCOUNTER — Ambulatory Visit: Payer: Medicare (Managed Care) | Admitting: Internal Medicine

## 2021-02-08 ENCOUNTER — Encounter (HOSPITAL_COMMUNITY)
Admission: RE | Disposition: A | Discharge status: Home / Self Care | Payer: Self-pay | Source: Home / Self Care | Attending: Surgery

## 2021-02-08 ENCOUNTER — Ambulatory Visit (HOSPITAL_COMMUNITY)
Admission: RE | Admit: 2021-02-08 | Discharge: 2021-02-08 | Disposition: A | Discharge status: Home / Self Care | Payer: Medicare (Managed Care) | Attending: Surgery | Admitting: Surgery

## 2021-02-08 ENCOUNTER — Ambulatory Visit (HOSPITAL_COMMUNITY): Payer: Medicare (Managed Care) | Admitting: Anesthesiology

## 2021-02-08 ENCOUNTER — Ambulatory Visit (HOSPITAL_COMMUNITY): Payer: Medicare (Managed Care) | Admitting: Vascular Surgery

## 2021-02-08 DIAGNOSIS — D649 Anemia, unspecified: Secondary | ICD-10-CM | POA: Insufficient documentation

## 2021-02-08 DIAGNOSIS — F17200 Nicotine dependence, unspecified, uncomplicated: Secondary | ICD-10-CM | POA: Diagnosis not present

## 2021-02-08 DIAGNOSIS — Z792 Long term (current) use of antibiotics: Secondary | ICD-10-CM | POA: Diagnosis not present

## 2021-02-08 DIAGNOSIS — E871 Hypo-osmolality and hyponatremia: Secondary | ICD-10-CM | POA: Diagnosis not present

## 2021-02-08 DIAGNOSIS — K4090 Unilateral inguinal hernia, without obstruction or gangrene, not specified as recurrent: Secondary | ICD-10-CM | POA: Insufficient documentation

## 2021-02-08 DIAGNOSIS — Z4789 Encounter for other orthopedic aftercare: Secondary | ICD-10-CM | POA: Diagnosis not present

## 2021-02-08 DIAGNOSIS — Z79899 Other long term (current) drug therapy: Secondary | ICD-10-CM | POA: Insufficient documentation

## 2021-02-08 DIAGNOSIS — Z8249 Family history of ischemic heart disease and other diseases of the circulatory system: Secondary | ICD-10-CM | POA: Insufficient documentation

## 2021-02-08 DIAGNOSIS — G8918 Other acute postprocedural pain: Secondary | ICD-10-CM | POA: Diagnosis not present

## 2021-02-08 DIAGNOSIS — K219 Gastro-esophageal reflux disease without esophagitis: Secondary | ICD-10-CM | POA: Diagnosis not present

## 2021-02-08 DIAGNOSIS — E875 Hyperkalemia: Secondary | ICD-10-CM | POA: Diagnosis not present

## 2021-02-08 HISTORY — PX: INGUINAL HERNIA REPAIR: SHX194

## 2021-02-08 SURGERY — REPAIR, HERNIA, INGUINAL, ADULT
Anesthesia: General | Laterality: Left

## 2021-02-08 MED ORDER — CHLORHEXIDINE GLUCONATE CLOTH 2 % EX PADS: 6.0000 | MEDICATED_PAD | Freq: Once | CUTANEOUS | Status: DC

## 2021-02-08 MED ORDER — CEFAZOLIN SODIUM-DEXTROSE 2-3 GM-%(50ML) IV SOLR
INTRAVENOUS | Status: DC | PRN
  Administered 2021-02-08: 2 g via INTRAVENOUS

## 2021-02-08 MED ORDER — OXYCODONE HCL 5 MG PO TABS: 5.0000 mg | ORAL_TABLET | Freq: Four times a day (QID) | ORAL | 0 refills | Status: DC | PRN

## 2021-02-08 MED ORDER — LIDOCAINE 2% (20 MG/ML) 5 ML SYRINGE
INTRAMUSCULAR | Status: AC
  Filled 2021-02-08: qty 5

## 2021-02-08 MED ORDER — BUPIVACAINE HCL (PF) 0.25 % IJ SOLN
INTRAMUSCULAR | Status: DC | PRN
  Administered 2021-02-08: 30 mL

## 2021-02-08 MED ORDER — FENTANYL CITRATE (PF) 250 MCG/5ML IJ SOLN
INTRAMUSCULAR | Status: AC
  Filled 2021-02-08: qty 5

## 2021-02-08 MED ORDER — ORAL CARE MOUTH RINSE: 15.0000 mL | Freq: Once | OROMUCOSAL | Status: AC

## 2021-02-08 MED ORDER — FENTANYL CITRATE (PF) 100 MCG/2ML IJ SOLN
INTRAMUSCULAR | Status: AC
  Administered 2021-02-08: 100 ug via INTRAVENOUS
  Filled 2021-02-08: qty 2

## 2021-02-08 MED ORDER — ACETAMINOPHEN 325 MG PO TABS: 325.0000 mg | ORAL_TABLET | ORAL | Status: DC | PRN

## 2021-02-08 MED ORDER — CEFAZOLIN SODIUM-DEXTROSE 2-4 GM/100ML-% IV SOLN
2.0000 g | INTRAVENOUS | Status: DC
  Filled 2021-02-08: qty 100

## 2021-02-08 MED ORDER — OXYCODONE HCL 5 MG/5ML PO SOLN: 5.0000 mg | Freq: Once | ORAL | Status: DC | PRN

## 2021-02-08 MED ORDER — ACETAMINOPHEN 500 MG PO TABS
1000.0000 mg | ORAL_TABLET | ORAL | Status: DC
  Filled 2021-02-08: qty 2

## 2021-02-08 MED ORDER — OXYCODONE HCL 5 MG PO TABS: 5.0000 mg | ORAL_TABLET | Freq: Once | ORAL | Status: DC | PRN

## 2021-02-08 MED ORDER — FENTANYL CITRATE (PF) 100 MCG/2ML IJ SOLN: 25.0000 ug | INTRAMUSCULAR | Status: DC | PRN

## 2021-02-08 MED ORDER — LIDOCAINE 2% (20 MG/ML) 5 ML SYRINGE
INTRAMUSCULAR | Status: DC | PRN
  Administered 2021-02-08: 40 mg via INTRAVENOUS

## 2021-02-08 MED ORDER — BUPIVACAINE-EPINEPHRINE 0.5% -1:200000 IJ SOLN
INTRAMUSCULAR | Status: DC | PRN
  Administered 2021-02-08: 10 mL

## 2021-02-08 MED ORDER — PROPOFOL 10 MG/ML IV BOLUS
INTRAVENOUS | Status: DC | PRN
  Administered 2021-02-08: 170 mg via INTRAVENOUS

## 2021-02-08 MED ORDER — PROMETHAZINE HCL 25 MG/ML IJ SOLN: 6.2500 mg | INTRAMUSCULAR | Status: DC | PRN

## 2021-02-08 MED ORDER — FENTANYL CITRATE (PF) 100 MCG/2ML IJ SOLN
INTRAMUSCULAR | Status: DC | PRN
  Administered 2021-02-08: 50 ug via INTRAVENOUS

## 2021-02-08 MED ORDER — LACTATED RINGERS IV SOLN: INTRAVENOUS | Status: DC

## 2021-02-08 MED ORDER — BUPIVACAINE-EPINEPHRINE 0.5% -1:200000 IJ SOLN
INTRAMUSCULAR | Status: AC
  Filled 2021-02-08: qty 1

## 2021-02-08 MED ORDER — ACETAMINOPHEN 160 MG/5ML PO SOLN: 325.0000 mg | ORAL | Status: DC | PRN

## 2021-02-08 MED ORDER — 0.9 % SODIUM CHLORIDE (POUR BTL) OPTIME
TOPICAL | Status: DC | PRN
  Administered 2021-02-08: 1000 mL

## 2021-02-08 MED ORDER — AMISULPRIDE (ANTIEMETIC) 5 MG/2ML IV SOLN: 10.0000 mg | Freq: Once | INTRAVENOUS | Status: DC | PRN

## 2021-02-08 MED ORDER — PHENYLEPHRINE HCL (PRESSORS) 10 MG/ML IV SOLN
INTRAVENOUS | Status: DC | PRN
  Administered 2021-02-08: 80 ug via INTRAVENOUS

## 2021-02-08 MED ORDER — PROPOFOL 10 MG/ML IV BOLUS
INTRAVENOUS | Status: AC
  Filled 2021-02-08: qty 20

## 2021-02-08 MED ORDER — CHLORHEXIDINE GLUCONATE 0.12 % MT SOLN
15.0000 mL | Freq: Once | OROMUCOSAL | Status: AC
  Administered 2021-02-08: 15 mL via OROMUCOSAL
  Filled 2021-02-08: qty 15

## 2021-02-08 MED ORDER — ACETAMINOPHEN 10 MG/ML IV SOLN: 1000.0000 mg | Freq: Once | INTRAVENOUS | Status: DC | PRN

## 2021-02-08 MED ORDER — FENTANYL CITRATE (PF) 100 MCG/2ML IJ SOLN: 100.0000 ug | Freq: Once | INTRAMUSCULAR | Status: AC

## 2021-02-08 MED ORDER — MIDAZOLAM HCL 2 MG/2ML IJ SOLN
INTRAMUSCULAR | Status: AC
  Administered 2021-02-08: 2 mg via INTRAVENOUS
  Filled 2021-02-08: qty 2

## 2021-02-08 MED ORDER — MIDAZOLAM HCL 2 MG/2ML IJ SOLN: 2.0000 mg | Freq: Once | INTRAMUSCULAR | Status: AC

## 2021-02-08 MED ORDER — ONDANSETRON HCL 4 MG/2ML IJ SOLN
INTRAMUSCULAR | Status: DC | PRN
  Administered 2021-02-08: 4 mg via INTRAVENOUS

## 2021-02-08 SURGICAL SUPPLY — 35 items
ADH SKN CLS APL DERMABOND .7 (GAUZE/BANDAGES/DRESSINGS) ×1
APL PRP STRL LF DISP 70% ISPRP (MISCELLANEOUS) ×1
BAG COUNTER SPONGE SURGICOUNT (BAG) ×2 IMPLANT
BAG SPNG CNTER NS LX DISP (BAG) ×1
BLADE CLIPPER SURG (BLADE) IMPLANT
CHLORAPREP W/TINT 26 (MISCELLANEOUS) ×2 IMPLANT
COVER SURGICAL LIGHT HANDLE (MISCELLANEOUS) ×2 IMPLANT
DERMABOND ADVANCED (GAUZE/BANDAGES/DRESSINGS) ×1
DERMABOND ADVANCED .7 DNX12 (GAUZE/BANDAGES/DRESSINGS) ×1 IMPLANT
DRAIN PENROSE 1/2X12 LTX STRL (WOUND CARE) ×2 IMPLANT
DRAPE LAPAROTOMY TRNSV 102X78 (DRAPES) ×2 IMPLANT
ELECT REM PT RETURN 9FT ADLT (ELECTROSURGICAL) ×2
ELECTRODE REM PT RTRN 9FT ADLT (ELECTROSURGICAL) ×1 IMPLANT
GLOVE SURG SIGNA 7.5 PF LTX (GLOVE) ×2 IMPLANT
GOWN STRL REUS W/ TWL LRG LVL3 (GOWN DISPOSABLE) ×1 IMPLANT
GOWN STRL REUS W/ TWL XL LVL3 (GOWN DISPOSABLE) ×1 IMPLANT
GOWN STRL REUS W/TWL LRG LVL3 (GOWN DISPOSABLE) ×2
GOWN STRL REUS W/TWL XL LVL3 (GOWN DISPOSABLE) ×2
KIT BASIN OR (CUSTOM PROCEDURE TRAY) ×2 IMPLANT
KIT TURNOVER KIT B (KITS) ×2 IMPLANT
MESH PARIETEX PROGRIP LEFT (Mesh General) ×2 IMPLANT
NEEDLE HYPO 25GX1X1/2 BEV (NEEDLE) ×2 IMPLANT
NS IRRIG 1000ML POUR BTL (IV SOLUTION) ×2 IMPLANT
PACK GENERAL/GYN (CUSTOM PROCEDURE TRAY) ×2 IMPLANT
PAD ARMBOARD 7.5X6 YLW CONV (MISCELLANEOUS) ×2 IMPLANT
PENCIL SMOKE EVACUATOR (MISCELLANEOUS) IMPLANT
SUT MON AB 4-0 PC3 18 (SUTURE) ×2 IMPLANT
SUT SILK 2 0 SH (SUTURE) ×2 IMPLANT
SUT VIC AB 2-0 CT1 27 (SUTURE) ×4
SUT VIC AB 2-0 CT1 TAPERPNT 27 (SUTURE) ×2 IMPLANT
SUT VIC AB 3-0 CT1 27 (SUTURE) ×2
SUT VIC AB 3-0 CT1 TAPERPNT 27 (SUTURE) ×1 IMPLANT
SYR CONTROL 10ML LL (SYRINGE) ×2 IMPLANT
TOWEL GREEN STERILE (TOWEL DISPOSABLE) ×2 IMPLANT
TOWEL GREEN STERILE FF (TOWEL DISPOSABLE) ×2 IMPLANT

## 2021-02-08 NOTE — Anesthesia Procedure Notes (Signed)
Anesthesia Regional Block: TAP block   Pre-Anesthetic Checklist: , timeout performed,  Correct Patient, Correct Site, Correct Laterality,  Correct Procedure, Correct Position, site marked,  Risks and benefits discussed,  Surgical consent,  Pre-op evaluation,  At surgeon's request and post-op pain management  Laterality: Left  Prep: chloraprep       Needles:  Injection technique: Single-shot  Needle Type: Echogenic Stimulator Needle     Needle Length: 10cm  Needle Gauge: 20     Additional Needles:   Procedures:,,,, ultrasound used (permanent image in chart),,    Narrative:  Start time: 02/08/2021 2:55 PM End time: 02/08/2021 3:01 PM Injection made incrementally with aspirations every 5 mL.  Performed by: Personally  Anesthesiologist: Lidia Collum, MD  Additional Notes: Standard monitors applied. Skin prepped. Good needle visualization with ultrasound. Injection made in 5cc increments with no resistance to injection. Patient tolerated the procedure well.

## 2021-02-08 NOTE — Anesthesia Postprocedure Evaluation (Signed)
Anesthesia Post Note  Patient: Mark [name].  Procedure(s) Performed: OPEN LEFT INGUINAL HERNIA REPAIR WITH MESH (Left)     Patient location during evaluation: PACU Anesthesia Type: General Level of consciousness: awake Pain management: pain level controlled Respiratory status: spontaneous breathing Cardiovascular status: stable Postop Assessment: no apparent nausea or vomiting Anesthetic complications: no   No notable events documented.  Last Vitals:  Vitals:   02/08/21 1642 02/08/21 1655  BP: 137/72 (!) 143/67  Pulse: 82 78  Resp: (!) 24 13  Temp:  36.6 C  SpO2: 100% 100%    Last Pain:  Vitals:   02/08/21 1612  TempSrc:   PainSc: Asleep                 Shanta Hartner

## 2021-02-08 NOTE — H&P (Signed)
Mark [name]  Location: [facility] Patient #: [id number] DOB: Jun 01, 1955 Married / Language: English / Race: Black or African American Male   History of Present Illness  The patient is a 65 year old male who presents with an inguinal hernia.  Chief complaint: Left inguinal hernia  This gentleman is referred here for evaluation of left inguinal hernia.  He has multiple medical issues including history of alcohol abuse.  He recently required replacement of infected knee prosthetic.  He has been having increasing discomfort from a left inguinal hernia with pain but no obstructive symptoms.  He reports that it does easily reduce.  His most recent liver fossa just for normal.  He did require transfusion after his most recent surgery.  He has no nausea or vomiting.  Surgery was cancelled previously due to his chronic anemia and need for long term antibiotics.  He remains on antibiotics   Past Surgical History  Knee Surgery   Right. Oral Surgery    Diagnostic Studies History  Colonoscopy   1-5 years ago  Allergies  No Known Drug Allergies    Allergies Reconciled    Medication History  Pantoprazole Sodium  (40MG  For Solution, Intravenous) Active. Doxycycline  (40MG  Capsule DR, Oral) Active. Furosemide  (20MG  Tablet, Oral) Active. Iron  (325 (65 Fe)MG Tablet, Oral) Active. Folic Acid  (1MG  Tablet, Oral) Active. Docusate Calcium  (240MG  Capsule, Oral) Active. Medications Reconciled   Social History  Alcohol use   Occasional alcohol use. No drug use   Tobacco use   Current some day smoker.  Family History  Arthritis   Father. Hypertension   Brother.  Other Problems  Back Pain   Heart murmur   Inguinal Hernia   Transfusion history      Review of Systems  General Not Present- Appetite Loss, Chills, Fatigue, Fever, Night Sweats, Weight Gain and Weight Loss. Skin Not Present- Change in Wart/Mole, Dryness, Hives, Jaundice, New Lesions, Non-Healing Wounds, Rash  and Ulcer. HEENT Not Present- Earache, Hearing Loss, Hoarseness, Nose Bleed, Oral Ulcers, Ringing in the Ears, Seasonal Allergies, Sinus Pain, Sore Throat, Visual Disturbances, Wears glasses/contact lenses and Yellow Eyes. Respiratory Not Present- Bloody sputum, Chronic Cough, Difficulty Breathing, Snoring and Wheezing. Breast Not Present- Breast Mass, Breast Pain, Nipple Discharge and Skin Changes. Cardiovascular Not Present- Chest Pain, Difficulty Breathing Lying Down, Leg Cramps, Palpitations, Rapid Heart Rate, Shortness of Breath and Swelling of Extremities. Gastrointestinal Not Present- Abdominal Pain, Bloating, Bloody Stool, Change in Bowel Habits, Chronic diarrhea, Constipation, Difficulty Swallowing, Excessive gas, Gets full quickly at meals, Hemorrhoids, Indigestion, Nausea, Rectal Pain and Vomiting. Male Genitourinary Present- Painful Urination. Not Present- Blood in Urine, Change in Urinary Stream, Frequency, Impotence, Nocturia, Urgency and Urine Leakage. Musculoskeletal Not Present- Back Pain, Joint Pain, Joint Stiffness, Muscle Pain, Muscle Weakness and Swelling of Extremities. Neurological Present- Trouble walking and Weakness. Not Present- Decreased Memory, Fainting, Headaches, Numbness, Seizures, Tingling and Tremor. Psychiatric Not Present- Anxiety, Bipolar, Change in Sleep Pattern, Depression, Fearful and Frequent crying. Endocrine Not Present- Cold Intolerance, Excessive Hunger, Hair Changes, Heat Intolerance, Hot flashes and New Diabetes. Hematology Not Present- Blood Thinners, Easy Bruising, Excessive bleeding, Gland problems, HIV and Persistent Infections.  Vitals   Weight: 124 lb   Height: 68 in  Body Surface Area: 1.67 m   Body Mass Index: 18.85 kg/m   Temp.: 98.2 F    Pulse: 97 (Regular)    P.OX: 99% (Room air) BP: 120/50(Sitting, Left Arm, Standard)    Physical  Exam  The physical exam findings are as follows: Note:  He appears well exam today.  He has a small,  easily reducible left inguinal hernia without evidence of right inguinal hernia. His abdomen is soft. There is no evidence of ascites  Lungs clear   CV RRR  Skin without erythema  Neuro grossly intact    Assessment & Plan   LEFT INGUINAL HERNIA (K40.90)  Impression: I have reviewed his notes in the electronic medical records.  He does have a symptomatic left inguinal hernia. I discussed the diagnosis with him. He is eager proceed with repair. I would plan an open repair with mesh given his previous history of alcohol abuse and cirrhosis. I explained the risks which includes but is not limited to bleeding, infection, injury to surrounding structures, these of mesh, nerve entrapment, chronic pain, etc. He understands and wishes to proceed with surgery soon as possible.  His chronic anemia is stable and he will remain currently on long term antibiotics.  ID has seen him and feels it is safe to proceed with hernia repair from an infectious disease standpoint

## 2021-02-08 NOTE — Interval H&P Note (Signed)
History and Physical Interval Note: no change in H and P  02/08/2021 2:51 PM  Mark [name].  has presented today for surgery, with the diagnosis of LEFT INGUINAL HERNIA.  The various methods of treatment have been discussed with the patient and family. After consideration of risks, benefits and other options for treatment, the patient has consented to  Procedure(s): OPEN LEFT INGUINAL HERNIA REPAIR WITH MESH (Left) as a surgical intervention.  The patient's history has been reviewed, patient examined, no change in status, stable for surgery.  I have reviewed the patient's chart and labs.  Questions were answered to the patient's satisfaction.     Coralie Keens

## 2021-02-08 NOTE — Transfer of Care (Signed)
Immediate Anesthesia Transfer of Care Note  Patient: Mark [name].  Procedure(s) Performed: OPEN LEFT INGUINAL HERNIA REPAIR WITH MESH (Left)  Patient Location: PACU  Anesthesia Type:General  Level of Consciousness: sedated  Airway & Oxygen Therapy: Patient connected to face mask oxygen  Post-op Assessment: Post -op Vital signs reviewed and stable  Post vital signs: stable  Last Vitals:  Vitals Value Taken Time  BP 139/64 02/08/21 1611  Temp    Pulse 75 02/08/21 1611  Resp 10 02/08/21 1611  SpO2 100 % 02/08/21 1611  Vitals shown include unvalidated device data.  Last Pain:  Vitals:   02/08/21 1502  TempSrc:   PainSc: 0-No pain      Patients Stated Pain Goal: 3 (80/99/83 [id number])  Complications: No notable events documented.

## 2021-02-08 NOTE — Discharge Instructions (Signed)
[facility] _______[facility]  UMBILICAL OR INGUINAL HERNIA REPAIR: POST OP INSTRUCTIONS  Always review your discharge instruction sheet given to you by the facility where your surgery was performed. IF YOU HAVE DISABILITY OR FAMILY LEAVE FORMS, YOU MUST BRING THEM TO THE OFFICE FOR PROCESSING.   DO NOT GIVE THEM TO YOUR DOCTOR.  1. A  prescription for pain medication may be given to you upon discharge.  Take your pain medication as prescribed, if needed.  If narcotic pain medicine is not needed, then you may take acetaminophen (Tylenol) or ibuprofen (Advil) as needed. 2. Take your usually prescribed medications unless otherwise directed. If you need a refill on your pain medication, please contact your pharmacy.  They will contact our office to request authorization. Prescriptions will not be filled after 5 pm or on week-ends. 3. You should follow a light diet the first 24 hours after arrival home, such as soup and crackers, etc.  Be sure to include lots of fluids daily.  Resume your normal diet the day after surgery. 4.Most patients will experience some swelling and bruising around the umbilicus or in the groin and scrotum.  Ice packs and reclining will help.  Swelling and bruising can take several days to resolve.  6. It is common to experience some constipation if taking pain medication after surgery.  Increasing fluid intake and taking a stool softener (such as Colace) will usually help or prevent this problem from occurring.  A mild laxative (Milk of Magnesia or Miralax) should be taken according to package directions if there are no bowel movements after 48 hours. 7. Unless discharge instructions indicate otherwise, you may remove your bandages 24-48 hours after surgery, and you may shower at that time.  You may have steri-strips (small skin tapes) in place directly over the incision.  These strips should be left on the skin for 7-10 days.  If your surgeon used skin glue on the  incision, you may shower in 24 hours.  The glue will flake off over the next 2-3 weeks.  Any sutures or staples will be removed at the office during your follow-up visit. 8. ACTIVITIES:  You may resume regular (light) daily activities beginning the next day--such as daily self-care, walking, climbing stairs--gradually increasing activities as tolerated.  You may have sexual intercourse when it is comfortable.  Refrain from any heavy lifting or straining until approved by your doctor.  a.You may drive when you are no longer taking prescription pain medication, you can comfortably wear a seatbelt, and you can safely maneuver your car and apply brakes. b.RETURN TO WORK:   _____________________________________________  9.You should see your doctor in the office for a follow-up appointment approximately 2-3 weeks after your surgery.  Make sure that you call for this appointment within a day or two after you arrive home to insure a convenient appointment time. 10.OTHER INSTRUCTIONS: _OK TO SHOWER STARTING TOMORROW ICE PACK, TYLENOL ALSO FOR PAIN NO LIFTING MORE THAN 15 POUNDS FOR 4 WEEKS________________________    _____________________________________  WHEN TO CALL YOUR DOCTOR: Fever over 101.0 Inability to urinate Nausea and/or vomiting Extreme swelling or bruising Continued bleeding from incision. Increased pain, redness, or drainage from the incision  The clinic staff is available to answer your questions during regular business hours.  Please don't hesitate to call and ask to speak to one of the nurses for clinical concerns.  If you have a medical emergency, go to the nearest emergency room or call 911.  A surgeon  from [facility] is always on call at the hospital   [address], [place], [place], [place]  [address] ?  [address], [place], [place]   [address] [phone] ? [phone] ? FAX [phone] Web site: [email or web address]

## 2021-02-08 NOTE — Op Note (Signed)
sameOPEN LEFT INGUINAL HERNIA REPAIR WITH MESH  Procedure Note  [name]. 02/08/2021   Pre-op Diagnosis: LEFT INGUINAL HERNIA     Post-op Diagnosis: Mark [name]  Procedure(s): OPEN LEFT INGUINAL HERNIA REPAIR WITH MESH  Surgeon(s): Coralie Keens, MD Vernell Leep, MD, [facility] Resident  Anesthesia: General  Staff:  Circulator: Beola Cord, RN Scrub Person: Martin Majestic  Estimated Blood Loss: Minimal               Findings: The patient was found to have an indirect left inguinal hernia which was paired with a large piece of Prolene ProGrip mesh from [employer]  Procedure: The patient was identified the preoperative area and anesthesiology performed a left tap block.  He was then taken to the operating room.  He was placed on the operating table general anesthesia was induced.  His abdomen was prepped and draped in usual sterile fashion.  The skin in the left inguinal area is anesthetized Marcaine.  A longitudinal incision was made with a scalpel.  Dissection was then carried down through Scarpa's fascia with electrocautery.  The external bleak fascia was identified and opened toward the internal and external rings.  This testicular cord and structures were then controlled with a Penrose drain.  The patient had a small indirect hernia sac.  It was dissected down to the base.  All contents of been reduced.  The sac was tied off with a 2-0 silk suture and then excised with the cautery.  Next a large piece of Prolene ProGrip mesh was brought to the field.  It was placed against the pubic tubercle and then brought around the cord structures.  It was then sutured in place with 2 separate 2-0 Vicryl sutures.  Wide coverage of the inguinal floor and good coverage of the internal ring appeared to be achieved.  The external bleak fascia was then closed over the top of the mesh with a running 2-0 Vicryl suture.  Scarpa's fascia then closed interrupted 3-0 Vicryl sutures and skin was  closed with running 4-0 Monocryl.  Dermabond was then applied.  The patient tolerated procedure well.  All the counts were correct at the end of the procedure.  The patient was then extubated in the operating room and taken in stable addition to the recovery room.          Coralie Keens   Date: 02/08/2021  Time: 4:32 PM

## 2021-02-11 ENCOUNTER — Encounter (HOSPITAL_COMMUNITY): Payer: Self-pay | Admitting: Surgery

## 2021-02-20 ENCOUNTER — Other Ambulatory Visit: Payer: Self-pay | Admitting: Family Medicine

## 2021-02-22 DIAGNOSIS — Z96651 Presence of right artificial knee joint: Secondary | ICD-10-CM | POA: Diagnosis not present

## 2021-02-22 DIAGNOSIS — M25561 Pain in right knee: Secondary | ICD-10-CM | POA: Diagnosis not present

## 2021-02-22 DIAGNOSIS — T8453XD Infection and inflammatory reaction due to internal right knee prosthesis, subsequent encounter: Secondary | ICD-10-CM | POA: Diagnosis not present

## 2021-02-22 DIAGNOSIS — T8484XA Pain due to internal orthopedic prosthetic devices, implants and grafts, initial encounter: Secondary | ICD-10-CM | POA: Diagnosis not present

## 2021-03-06 DIAGNOSIS — Z96651 Presence of right artificial knee joint: Secondary | ICD-10-CM | POA: Diagnosis not present

## 2021-03-06 DIAGNOSIS — T8453XD Infection and inflammatory reaction due to internal right knee prosthesis, subsequent encounter: Secondary | ICD-10-CM | POA: Diagnosis not present

## 2021-03-18 ENCOUNTER — Other Ambulatory Visit: Payer: Self-pay | Admitting: Family Medicine

## 2021-03-18 ENCOUNTER — Ambulatory Visit: Payer: Medicare (Managed Care)

## 2021-03-18 ENCOUNTER — Telehealth: Payer: Self-pay

## 2021-03-18 NOTE — Telephone Encounter (Signed)
This nurse called patient three times for scheduled telephonic AWV. Called at 0810, 0815, and 0825. Message left that we will call back in order to reschedule for another time.

## 2021-03-27 ENCOUNTER — Telehealth: Payer: Self-pay | Admitting: Family Medicine

## 2021-03-27 NOTE — Telephone Encounter (Signed)
Left message for patient to call back and schedule Medicare Annual Wellness Visit (AWV) either virtually or in office. Left  my Herbie Drape number [phone]   Last AWV 03/14/20  please schedule at anytime with [facility] Nurse Health Advisor 1 or 2   This should be a 45 minute visit.

## 2021-04-05 ENCOUNTER — Other Ambulatory Visit: Payer: Self-pay | Admitting: Family Medicine

## 2021-04-17 DIAGNOSIS — Z96651 Presence of right artificial knee joint: Secondary | ICD-10-CM | POA: Diagnosis not present

## 2021-05-05 ENCOUNTER — Other Ambulatory Visit: Payer: Self-pay | Admitting: Family Medicine

## 2021-05-08 DIAGNOSIS — H25813 Combined forms of age-related cataract, bilateral: Secondary | ICD-10-CM | POA: Diagnosis not present

## 2021-05-08 DIAGNOSIS — H40023 Open angle with borderline findings, high risk, bilateral: Secondary | ICD-10-CM | POA: Diagnosis not present

## 2021-05-16 ENCOUNTER — Ambulatory Visit (INDEPENDENT_AMBULATORY_CARE_PROVIDER_SITE_OTHER): Payer: Medicare PPO

## 2021-05-16 VITALS — Ht 66.0 in | Wt 124.0 lb

## 2021-05-16 DIAGNOSIS — Z0000 Encounter for general adult medical examination without abnormal findings: Secondary | ICD-10-CM | POA: Diagnosis not present

## 2021-05-16 NOTE — Patient Instructions (Signed)
Mark [name] , Thank you for taking time to come for your Medicare Wellness Visit. I appreciate your ongoing commitment to your health goals. Please review the following plan we discussed and let me know if I can assist you in the future.   These are the goals we discussed:  Goals      Exercise 3x per week (30 min per time)     Patient Stated     I would like to work on some things around my house.        This is a list of the screening recommended for you and due dates:  Health Maintenance  Topic Date Due   COVID-19 Vaccine (1) 06/01/2021*   Flu Shot  07/26/2021*   Zoster (Shingles) Vaccine (1 of 2) 08/14/2021*   Pneumonia Vaccine (1 - PCV) 05/16/2022*   Tetanus Vaccine  12/06/2023   Colon Cancer Screening  02/06/2026   Hepatitis C Screening: USPSTF Recommendation to screen - Ages 18-79 yo.  Completed   HPV Vaccine  Aged Out  *Topic was postponed. The date shown is not the original due date.    Opioid Pain Medicine Management Opioids are powerful medicines that are used to treat moderate to severe pain. When used for short periods of time, they can help you to: Sleep better. Do better in physical or occupational therapy. Feel better in the first few days after an injury. Recover from surgery. Opioids should be taken with the supervision of a trained health care provider. They should be taken for the shortest period of time possible. This is because opioids can be addictive, and the longer you take opioids, the greater your risk of addiction. This addiction can also be called opioid use disorder. What are the risks? Using opioid pain medicines for longer than 3 days increases your risk of side effects. Side effects include: Constipation. Nausea and vomiting. Breathing difficulties (respiratory depression). Drowsiness. Confusion. Opioid use disorder. Itching. Taking opioid pain medicine for a long period of time can affect your ability to do daily tasks. It also puts you at  risk for: Motor vehicle crashes. Depression. Suicide. Heart attack. Overdose, which can be life-threatening. What is a pain treatment plan? A pain treatment plan is an agreement between you and your health care provider. Pain is unique to each person, and treatments vary depending on your condition. To manage your pain, you and your health care provider need to work together. To help you do this: Discuss the goals of your treatment, including how much pain you might expect to have and how you will manage the pain. Review the risks and benefits of taking opioid medicines. Remember that a good treatment plan uses more than one approach and minimizes the chance of side effects. Be honest about the amount of medicines you take and about any drug or alcohol use. Get pain medicine prescriptions from only one health care provider. Pain can be managed with many types of alternative treatments. Ask your health care provider to refer you to one or more specialists who can help you manage pain through: Physical or occupational therapy. Counseling (cognitive behavioral therapy). Good nutrition. Biofeedback. Massage. Meditation. Non-opioid medicine. Following a gentle exercise program. How to use opioid pain medicine Taking medicine Take your pain medicine exactly as told by your health care provider. Take it only when you need it. If your pain gets less severe, you may take less than your prescribed dose if your health care provider approves. If you are not having  pain, do nottake pain medicine unless your health care provider tells you to take it. If your pain is severe, do nottry to treat it yourself by taking more pills than instructed on your prescription. Contact your health care provider for help. Write down the times when you take your pain medicine. It is easy to become confused while on pain medicine. Writing the time can help you avoid overdose. Take other over-the-counter or prescription  medicines only as told by your health care provider. Keeping yourself and others safe  While you are taking opioid pain medicine: Do not drive, use machinery, or power tools. Do not sign legal documents. Do not drink alcohol. Do not take sleeping pills. Do not supervise children by yourself. Do not do activities that require climbing or being in high places. Do not go to a lake, river, ocean, spa, or swimming pool. Do not share your pain medicine with anyone. Keep pain medicine in a locked cabinet or in a secure area where pets and children cannot reach it. Stopping your use of opioids If you have been taking opioid medicine for more than a few weeks, you may need to slowly decrease (taper) how much you take until you stop completely. Tapering your use of opioids can decrease your risk of symptoms of withdrawal, such as: Pain and cramping in the abdomen. Nausea. Sweating. Sleepiness. Restlessness. Uncontrollable shaking (tremors). Cravings for the medicine. Do not attempt to taper your use of opioids on your own. Talk with your health care provider about how to do this. Your health care provider may prescribe a step-down schedule based on how much medicine you are taking and how long you have been taking it. Getting rid of leftover pills Do not save any leftover pills. Get rid of leftover pills safely by: Taking the medicine to a prescription take-back program. This is usually offered by the county or law enforcement. Bringing them to a pharmacy that has a drug disposal container. Flushing them down the toilet. Check the label or package insert of your medicine to see whether this is safe to do. Throwing them out in the trash. Check the label or package insert of your medicine to see whether this is safe to do. If it is safe to throw it out, remove the medicine from the original container, put it into a sealable bag or container, and mix it with used coffee grounds, food scraps, dirt, or  cat litter before putting it in the trash. Follow these instructions at home: Activity Do exercises as told by your health care provider. Avoid activities that make your pain worse. Return to your normal activities as told by your health care provider. Ask your health care provider what activities are safe for you. General instructions You may need to take these actions to prevent or treat constipation: Drink enough fluid to keep your urine pale yellow. Take over-the-counter or prescription medicines. Eat foods that are high in fiber, such as beans, whole grains, and fresh fruits and vegetables. Limit foods that are high in fat and processed sugars, such as fried or sweet foods. Keep all follow-up visits. This is important. Where to find support If you have been taking opioids for a long time, you may benefit from receiving support for quitting from a local support group or counselor. Ask your health care provider for a referral to these resources in your area. Where to find more information Centers for Disease Control and Prevention (CDC): http://www.wolf.info/ [place] Food and Drug Administration (FDA):  [employer] Get help right away if: You may have taken too much of an opioid (overdosed). Common symptoms of an overdose: Your breathing is slower or more shallow than normal. You have a very slow heartbeat (pulse). You have slurred speech. You have nausea and vomiting. Your pupils become very small. You have other potential symptoms: You are very confused. You faint or feel like you will faint. You have cold, clammy skin. You have blue lips or fingernails. You have thoughts of harming yourself or harming others. These symptoms may represent a serious problem that is an emergency. Do not wait to see if the symptoms will go away. Get medical help right away. Call your local emergency services (911 in the [place] Do not drive yourself to the hospital.  If you ever feel like you may hurt yourself or  others, or have thoughts about taking your own life, get help right away. Go to your nearest emergency department or: Call your local emergency services (911 in the [place] Call the [facility] (954)840-5444 in the [place] Call a suicide crisis helpline, such as the [facility] at 216 384 2545 or 988 in the [place]. This is open 24 hours a day in the [place] Text the Crisis Text Line at 504-183-8097 (in the [place].). Summary Opioid medicines can help you manage moderate to severe pain for a short period of time. A pain treatment plan is an agreement between you and your health care provider. Discuss the goals of your treatment, including how much pain you might expect to have and how you will manage the pain. If you think that you or someone else may have taken too much of an opioid, get medical help right away. This information is not intended to replace advice given to you by your health care provider. Make sure you discuss any questions you have with your health care provider. Document Revised: 11/07/2020 Document Reviewed: 07/25/2020 Elsevier Patient Education  [place] directives: No  Conditions/risks identified: None  Next appointment: Follow up in one year for your annual wellness visit.   Preventive Care 31 Years and Older, Male Preventive care refers to lifestyle choices and visits with your health care provider that can promote health and wellness. What does preventive care include? A yearly physical exam. This is also called an annual well check. Dental exams once or twice a year. Routine eye exams. Ask your health care provider how often you should have your eyes checked. Personal lifestyle choices, including: Daily care of your teeth and gums. Regular physical activity. Eating a healthy diet. Avoiding tobacco and drug use. Limiting alcohol use. Practicing safe sex. Taking low doses of aspirin every day. Taking vitamin  and mineral supplements as recommended by your health care provider. What happens during an annual well check? The services and screenings done by your health care provider during your annual well check will depend on your age, overall health, lifestyle risk factors, and family history of disease. Counseling  Your health care provider may ask you questions about your: Alcohol use. Tobacco use. Drug use. Emotional well-being. Home and relationship well-being. Sexual activity. Eating habits. History of falls. Memory and ability to understand (cognition). Work and work Statistician. Screening  You may have the following tests or measurements: Height, weight, and BMI. Blood pressure. Lipid and cholesterol levels. These may be checked every 5 years, or more frequently if you are over 30 years old. Skin check. Lung cancer screening. You may have this  screening every year starting at age 55 if you have a 30-pack-year history of smoking and currently smoke or have quit within the past 15 years. Fecal occult blood test (FOBT) of the stool. You may have this test every year starting at age 67. Flexible sigmoidoscopy or colonoscopy. You may have a sigmoidoscopy every 5 years or a colonoscopy every 10 years starting at age 37. Prostate cancer screening. Recommendations will vary depending on your family history and other risks. Hepatitis C blood test. Hepatitis B blood test. Sexually transmitted disease (STD) testing. Diabetes screening. This is done by checking your blood sugar (glucose) after you have not eaten for a while (fasting). You may have this done every 1-3 years. Abdominal aortic aneurysm (AAA) screening. You may need this if you are a current or former smoker. Osteoporosis. You may be screened starting at age 44 if you are at high risk. Talk with your health care provider about your test results, treatment options, and if necessary, the need for more tests. Vaccines  Your health care  provider may recommend certain vaccines, such as: Influenza vaccine. This is recommended every year. Tetanus, diphtheria, and acellular pertussis (Tdap, Td) vaccine. You may need a Td booster every 10 years. Zoster vaccine. You may need this after age 66. Pneumococcal 13-valent conjugate (PCV13) vaccine. One dose is recommended after age 29. Pneumococcal polysaccharide (PPSV23) vaccine. One dose is recommended after age 29. Talk to your health care provider about which screenings and vaccines you need and how often you need them. This information is not intended to replace advice given to you by your health care provider. Make sure you discuss any questions you have with your health care provider. Document Released: 05/11/2015 Document Revised: 01/02/2016 Document Reviewed: 02/13/2015 Elsevier Interactive Patient Education  2017 [employer] Prevention in the Home Falls can cause injuries. They can happen to people of all ages. There are many things you can do to make your home safe and to help prevent falls. What can I do on the outside of my home? Regularly fix the edges of walkways and driveways and fix any cracks. Remove anything that might make you trip as you walk through a door, such as a raised step or threshold. Trim any bushes or trees on the path to your home. Use bright outdoor lighting. Clear any walking paths of anything that might make someone trip, such as rocks or tools. Regularly check to see if handrails are loose or broken. Make sure that both sides of any steps have handrails. Any raised decks and porches should have guardrails on the edges. Have any leaves, snow, or ice cleared regularly. Use sand or salt on walking paths during winter. Clean up any spills in your garage right away. This includes oil or grease spills. What can I do in the bathroom? Use night lights. Install grab bars by the toilet and in the tub and shower. Do not use towel bars as grab  bars. Use non-skid mats or decals in the tub or shower. If you need to sit down in the shower, use a plastic, non-slip stool. Keep the floor dry. Clean up any water that spills on the floor as soon as it happens. Remove soap buildup in the tub or shower regularly. Attach bath mats securely with double-sided non-slip rug tape. Do not have throw rugs and other things on the floor that can make you trip. What can I do in the bedroom? Use night lights. Make sure that you  have a light by your bed that is easy to reach. Do not use any sheets or blankets that are too big for your bed. They should not hang down onto the floor. Have a firm chair that has side arms. You can use this for support while you get dressed. Do not have throw rugs and other things on the floor that can make you trip. What can I do in the kitchen? Clean up any spills right away. Avoid walking on wet floors. Keep items that you use a lot in easy-to-reach places. If you need to reach something above you, use a strong step stool that has a grab bar. Keep electrical cords out of the way. Do not use floor polish or wax that makes floors slippery. If you must use wax, use non-skid floor wax. Do not have throw rugs and other things on the floor that can make you trip. What can I do with my stairs? Do not leave any items on the stairs. Make sure that there are handrails on both sides of the stairs and use them. Fix handrails that are broken or loose. Make sure that handrails are as long as the stairways. Check any carpeting to make sure that it is firmly attached to the stairs. Fix any carpet that is loose or worn. Avoid having throw rugs at the top or bottom of the stairs. If you do have throw rugs, attach them to the floor with carpet tape. Make sure that you have a light switch at the top of the stairs and the bottom of the stairs. If you do not have them, ask someone to add them for you. What else can I do to help prevent  falls? Wear shoes that: Do not have high heels. Have rubber bottoms. Are comfortable and fit you well. Are closed at the toe. Do not wear sandals. If you use a stepladder: Make sure that it is fully opened. Do not climb a closed stepladder. Make sure that both sides of the stepladder are locked into place. Ask someone to hold it for you, if possible. Clearly mark and make sure that you can see: Any grab bars or handrails. First and last steps. Where the edge of each step is. Use tools that help you move around (mobility aids) if they are needed. These include: Canes. Walkers. Scooters. Crutches. Turn on the lights when you go into a dark area. Replace any light bulbs as soon as they burn out. Set up your furniture so you have a clear path. Avoid moving your furniture around. If any of your floors are uneven, fix them. If there are any pets around you, be aware of where they are. Review your medicines with your doctor. Some medicines can make you feel dizzy. This can increase your chance of falling. Ask your doctor what other things that you can do to help prevent falls. This information is not intended to replace advice given to you by your health care provider. Make sure you discuss any questions you have with your health care provider. Document Released: 02/08/2009 Document Revised: 09/20/2015 Document Reviewed: 05/19/2014 Elsevier Interactive Patient Education  2017 [employer].

## 2021-05-16 NOTE — Progress Notes (Signed)
Subjective:   Mark [name]. is a 66 y.o. male who presents for Medicare Annual/Subsequent preventive examination.  Review of Systems    No ROS Cardiac Risk Factors include: advanced age (>22men, >71 women);hypertension    Objective:    Today's Vitals   05/16/21 1124  Weight: 124 lb (56.2 kg)  Height: 5\' 6"  (1.676 m)   Body mass index is 20.01 kg/m.  Advanced Directives 05/16/2021 01/31/2021 11/09/2020 11/09/2020 11/01/2020 10/23/2020 08/02/2020  Does Patient Have a Medical Advance Directive? No No No No No No No  Type of Advance Directive - - - - - - -  Does patient want to make changes to medical advance directive? - - - - - - -  Copy of [place] in Chart? - - - - - - -  Would patient like information on creating a medical advance directive? No - Patient declined No - Patient declined No - Patient declined - No - Patient declined No - Patient declined No - Patient declined  Pre-existing out of facility DNR order (yellow form or pink MOST form) - - - - - - -    Current Medications (verified) Outpatient Encounter Medications as of 05/16/2021  Medication Sig   aspirin EC 81 MG tablet Take 81 mg by mouth in the morning and at bedtime. Swallow whole.   docusate sodium (COLACE) 100 MG capsule Take 1 capsule (100 mg total) by mouth 2 (two) times daily.   doxycycline (VIBRA-TABS) 100 MG tablet Take 100 mg by mouth in the morning and at bedtime.   doxycycline (VIBRAMYCIN) 100 MG capsule Take 1 capsule (100 mg total) by mouth 2 (two) times daily. (Patient not taking: No sig reported)   ferrous sulfate 325 (65 FE) MG tablet Take 1 tablet (325 mg total) by mouth 2 (two) times daily with a meal. Take for two weeks as tolerated.   folic acid (FOLVITE) 1 MG tablet TAKE 1 TABLET(1 MG) BY MOUTH DAILY   furosemide (LASIX) 20 MG tablet TAKE 1 TABLET(20 MG) BY MOUTH TWICE DAILY   methocarbamol (ROBAXIN) 500 MG tablet Take 1 tablet (500 mg total) by mouth every 6 (six) hours as  needed for muscle spasms. (Patient not taking: No sig reported)   nadolol (CORGARD) 20 MG tablet TAKE 1 TABLET BY MOUTH EVERY DAY (Patient not taking: No sig reported)   oxyCODONE (OXY IR/ROXICODONE) 5 MG immediate release tablet Take 1 tablet (5 mg total) by mouth every 6 (six) hours as needed for severe pain or moderate pain.   Oxycodone HCl 10 MG TABS Take 10 mg by mouth daily as needed for pain.   pantoprazole (PROTONIX) 40 MG tablet TAKE 1 TABLET BY MOUTH EVERY DAY   polyethylene glycol (MIRALAX / GLYCOLAX) 17 g packet Take 17 g by mouth daily. (Patient not taking: No sig reported)   No facility-administered encounter medications on file as of 05/16/2021.    Allergies (verified) Patient has no known allergies.   History: Past Medical History:  Diagnosis Date   Alcohol abuse    Anemia    Arthritis    Ascites    Cirrhosis ([place])    Coffee ground emesis    Dehydration 06/17/2017   Febrile illness    GERD (gastroesophageal reflux disease)    Heart murmur    History of blood transfusion    Hyperlipidemia    Hypertension    Leg swelling    Myocardial infarction [facility]) 2012   Preop cardiovascular exam  04/14/2013   Sepsis ([place]) 06/17/2017   Septic shock ([place]) 06/18/2017   SIRS (systemic inflammatory response syndrome) ([place]) 07/11/2017   Stroke ([place]) 10/2009   TIA   Thrombocytopenia ([place])    Past Surgical History:  Procedure Laterality Date   COLONOSCOPY WITH PROPOFOL N/A 02/07/2016   Procedure: COLONOSCOPY WITH PROPOFOL;  Surgeon: Milus Banister, MD;  Location: [facility] ENDOSCOPY;  Service: Endoscopy;  Laterality: N/A;   ESOPHAGOGASTRODUODENOSCOPY (EGD) WITH PROPOFOL N/A 02/07/2016   Procedure: ESOPHAGOGASTRODUODENOSCOPY (EGD) WITH PROPOFOL;  Surgeon: Milus Banister, MD;  [facility];  Service: Endoscopy;  Laterality: N/A;   ESOPHAGOGASTRODUODENOSCOPY (EGD) WITH PROPOFOL N/A 06/22/2017   Procedure: ESOPHAGOGASTRODUODENOSCOPY (EGD) WITH PROPOFOL;  Surgeon: Jerene Bears,  MD;  Location: [facility] ENDOSCOPY;  Service: Gastroenterology;  Laterality: N/A;   EXCISIONAL TOTAL KNEE ARTHROPLASTY WITH ANTIBIOTIC SPACERS Right 03/20/2020   Procedure: Resection right total knee arthroplasty and placement of antibiotic spacer;  Surgeon: Paralee Cancel, MD;  Location: [facility] OR[facility];  Service: Orthopedics;  Laterality: Right;  90 mins   HERNIA REPAIR Right    inguinal   INGUINAL HERNIA REPAIR Left 02/08/2021   Procedure: OPEN LEFT INGUINAL HERNIA REPAIR WITH MESH;  Surgeon: Coralie Keens, MD;  Location: [place];  Service: General;  Laterality: Left;   IRRIGATION AND DEBRIDEMENT KNEE Right 11/13/2020   Procedure: IRRIGATION AND DEBRIDEMENT KNEE;  Surgeon: Paralee Cancel, MD;  Location: [facility];  Service: Orthopedics;  Laterality: Right;   KNEE ARTHROSCOPY     bilateral/  12/14   REIMPLANTATION OF TOTAL KNEE Right 08/02/2020   Procedure: REIMPLANTATION/REVISION OF TOTAL KNEE WITH REMOVAL OF ANTIBIOTIC SPACER;  Surgeon: Paralee Cancel, MD;  [facility];  Service: Orthopedics;  Laterality: Right;  10mins   TEE WITHOUT CARDIOVERSION N/A 05/13/2018   Procedure: TRANSESOPHAGEAL ECHOCARDIOGRAM (TEE);  Surgeon: [name], MD;  Location: [place];  Service: Cardiovascular;  Laterality: N/A;   TOTAL KNEE ARTHROPLASTY Right 05/02/2013   Procedure: RIGHT TOTAL KNEE ARTHROPLASTY;  Surgeon: Mauri Pole, MD;  [facility];  Service: Orthopedics;  Laterality: Right;   Family History  Problem Relation Age of Onset   Hypertension Father    Diabetes Father    Dementia Mother    Lupus Sister    Social History   Socioeconomic History   Marital status: Married    Spouse name: Not on file   Number of children: 4   Years of education: Not on file   Highest education level: Not on file  Occupational History   Occupation: Maintenance    Employer: [employer]  Tobacco Use   Smoking status: Former    Packs/day: 0.50    Years: 10.00    Pack years: 5.00    Types: Cigarettes     Quit date: 01/05/2016    Years since quitting: 5.3   Smokeless tobacco: Never   Tobacco comments:    4 cigarettes a day  Vaping Use   Vaping Use: Never used  Substance and Sexual Activity   Alcohol use: Yes    Comment: 2-3 beers/day   Drug use: Yes    Types: Marijuana   Sexual activity: Not Currently    Comment: now and then smokies marijuana  Other Topics Concern   Not on file  Social History Narrative   Not on file   Social Determinants of Health   Financial Resource Strain: Low Risk    Difficulty of Paying Living Expenses: Not hard at all  Food Insecurity: Food  Insecurity Present   Worried About Charity fundraiser in the Last Year: Sometimes true   Ran Out of Food in the Last Year: Sometimes true  Transportation Needs: No Transportation Needs   Lack of Transportation (Medical): No   Lack of Transportation (Non-Medical): No  Physical Activity: Sufficiently Active   Days of Exercise per Week: 7 days   Minutes of Exercise per Session: 40 min  Stress: No Stress Concern Present   Feeling of Stress : Not at all  Social Connections: Socially Integrated   Frequency of Communication with Friends and Family: More than three times a week   Frequency of Social Gatherings with Friends and Family: More than three times a week   Attends Religious Services: More than 4 times per year   Active Member of [employer] or Organizations: Yes   Attends Music therapist: More than 4 times per year   Marital Status: Married    Tobacco Counseling Counseling given: Not Answered Tobacco comments: 4 cigarettes a day   Clinical Intake:  Pre-visit preparation completed: YesDiabetic? No  Activities of Daily Living In your present state of health, do you have any difficulty performing the following activities: 05/16/2021 01/31/2021  Hearing? N N  Vision? Y N  Comment Patient in process of cataract removal. Followed by [place] -  Difficulty concentrating or making decisions? N N   Walking or climbing stairs? Tempie Donning  Comment Patient had knee replacement -  Dressing or bathing? N N  Doing errands, shopping? (No Data) N  Comment Wife and trasportation assist -  Conservation officer, nature and eating ? N -  Comment Wife assist -  Using the Toilet? N -  In the past six months, have you accidently leaked urine? N -  Do you have problems with loss of bowel control? N -  Managing your Medications? N -  Managing your Finances? N -  Housekeeping or managing your Housekeeping? N -  Comment Wife assist -  Some recent data might be hidden    Patient Care Team: [name], MD as PCP - General (Family Medicine) Josue Hector, MD as PCP - Cardiology (Cardiology)  Indicate any recent Medical Services you may have received from other than [facility] providers in the past year (date may be approximate).     Assessment:   This is a routine wellness examination for Mark [name].  Virtual Visit via Telephone Note  I connected with  Mark [name]. on 05/16/21 at 11:15 AM EST by telephone and verified that I am speaking with the correct person using two identifiers.  Location: Patient: Home Provider: Office Persons participating in the virtual visit: patient/Nurse Health Advisor   I discussed the limitations, risks, security and privacy concerns of performing an evaluation and management service by telephone and the availability of in person appointments. The patient expressed understanding and agreed to proceed.  Interactive audio and video telecommunications were attempted between this nurse and patient, however failed, due to patient having technical difficulties OR patient did not have access to video capability.  We continued and completed visit with audio only.  Some vital signs may be absent or patient reported.   Criselda Peaches, LPN   Hearing/Vision screen Hearing Screening - Comments:: No difficulty hearing. Vision Screening - Comments:: Patient in process of cataract  removal  Dietary issues and exercise activities discussed: Current Exercise Habits: Home exercise routine, Type of exercise: stretching;walking, Time (Minutes): 45, Frequency (Times/Week): 3, Weekly Exercise (Minutes/Week):  135, Intensity: Mild   Goals Addressed             This Visit's Progress    Patient Stated       I would like to work on some things around my house.       Depression Screen PHQ 2/9 Scores 05/16/2021 03/14/2020 08/17/2019 03/22/2019 11/15/2018 02/02/2017 06/29/2014  PHQ - 2 Score 0 0 1 0 0 0 1  PHQ- 9 Score - 0 - - - - -    Fall Risk Fall Risk  05/16/2021 12/18/2020 03/14/2020 08/17/2019 03/22/2019  Falls in the past year? 0 0 1 0 0  Comment - - - - -  Number falls in past yr: 0 0 1 - -  Comment - - 4-5 falls - -  Injury with Fall? 0 0 0 - -  Risk for fall due to : - - Impaired balance/gait Impaired balance/gait;Impaired mobility Impaired balance/gait;Impaired mobility;Orthopedic patient  Follow up - - Falls evaluation completed;Falls prevention discussed Falls evaluation completed;Education provided Falls evaluation completed    FALL RISK PREVENTION PERTAINING TO THE HOME:  Any stairs in or around the home? Yes  If so, are there any without handrails? Yes  Home free of loose throw rugs in walkways, pet beds, electrical cords, etc? Yes  Adequate lighting in your home to reduce risk of falls? Yes   ASSISTIVE DEVICES UTILIZED TO PREVENT FALLS:  Life alert? No  Use of a cane, walker or w/c? No  Grab bars in the bathroom? No  Shower chair or bench in shower? Yes  Elevated toilet seat or a handicapped toilet? No   TIMED UP AND GO:  Was the test performed? No . Audio Visit  Cognitive Function:    Immunizations Immunization History  Administered Date(s) Administered   Hep A / Hep B 06/06/2011, 06/19/2011, 06/27/2011   Tdap 12/05/2013   Zoster, Live 12/24/2013    Flu Vaccine status: Due, Education has been provided regarding the importance of this  vaccine. Advised may receive this vaccine at local pharmacy or Health Dept. Aware to provide a copy of the vaccination record if obtained from local pharmacy or Health Dept. Verbalized acceptance and understanding.  Pneumococcal vaccine status: Due, Education has been provided regarding the importance of this vaccine. Advised may receive this vaccine at local pharmacy or Health Dept. Aware to provide a copy of the vaccination record if obtained from local pharmacy or Health Dept. Verbalized acceptance and understanding.  Covid-19 vaccine status: Declined, Education has been provided regarding the importance of this vaccine but patient still declined. Advised may receive this vaccine at local pharmacy or Health Dept.or vaccine clinic. Aware to provide a copy of the vaccination record if obtained from local pharmacy or Health Dept. Verbalized acceptance and understanding.  Qualifies for Shingles Vaccine? Yes   Zostavax completed No   Shingrix Completed?: No.    Education has been provided regarding the importance of this vaccine. Patient has been advised to call insurance company to determine out of pocket expense if they have not yet received this vaccine. Advised may also receive vaccine at local pharmacy or Health Dept. Verbalized acceptance and understanding.  Screening Tests Health Maintenance  Topic Date Due   COVID-19 Vaccine (1) 06/01/2021 (Originally 02/24/1956)   INFLUENZA VACCINE  07/26/2021 (Originally 11/26/2020)   Zoster Vaccines- Shingrix (1 of 2) 08/14/2021 (Originally 08/25/1974)   Pneumonia Vaccine 62+ Years old (1 - PCV) 05/16/2022 (Originally 08/26/1961)   TETANUS/TDAP  12/06/2023   COLONOSCOPY (Pts  45-16yrs Insurance coverage will need to be confirmed)  02/06/2026   Hepatitis C Screening  Completed   HPV VACCINES  Aged Out    Health Maintenance  There are no preventive care reminders to display for this patient.    Additional Screening:   Vision Screening: Recommended  annual ophthalmology exams for early detection of glaucoma and other disorders of the eye. Is the patient up to date with their annual eye exam?  Yes  Who is the provider or what is the name of the office in which the patient attends annual eye exams? Followed by Dr Augusto Gamble .   Dental Screening: Recommended annual dental exams for proper oral hygiene  Community Resource Referral / Chronic Care Management:  CRR required this visit?  No   CCM required this visit?  No      Plan:     I have personally reviewed and noted the following in the patients chart:   Medical and social history Use of alcohol, tobacco or illicit drugs  Current medications and supplements including opioid prescriptions. Patient is currently taking opioid prescriptions. Information provided to patient regarding non-opioid alternatives. Patient advised to discuss non-opioid treatment plan with their provider. Functional ability and status Nutritional status Physical activity Advanced directives List of other physicians Hospitalizations, surgeries, and ER visits in previous 12 months Vitals Screenings to include cognitive, depression, and falls Referrals and appointments  In addition, I have reviewed and discussed with patient certain preventive protocols, quality metrics, and best practice recommendations. A written personalized care plan for preventive services as well as general preventive health recommendations were provided to patient.     Criselda Peaches, LPN   [id number]

## 2021-05-27 ENCOUNTER — Encounter: Payer: BC Managed Care – PPO | Admitting: Family Medicine

## 2021-09-05 ENCOUNTER — Encounter: Payer: Self-pay | Admitting: Internal Medicine

## 2021-09-05 ENCOUNTER — Other Ambulatory Visit: Payer: Self-pay

## 2021-09-05 ENCOUNTER — Ambulatory Visit (INDEPENDENT_AMBULATORY_CARE_PROVIDER_SITE_OTHER): Payer: Medicare Other | Admitting: Internal Medicine

## 2021-09-05 DIAGNOSIS — T8453XD Infection and inflammatory reaction due to internal right knee prosthesis, subsequent encounter: Secondary | ICD-10-CM | POA: Diagnosis not present

## 2021-09-05 MED ORDER — DOXYCYCLINE HYCLATE 100 MG PO CAPS: 100.0000 mg | ORAL_CAPSULE | Freq: Two times a day (BID) | ORAL | 11 refills | Status: DC

## 2021-09-05 NOTE — Progress Notes (Addendum)
[facility] for Infectious Disease  Patient Active Problem List   Diagnosis Date Noted   MRSA bacteremia 09/21/2018    Priority: High   Infection of prosthetic right knee joint ([place]) 09/20/2018    Priority: High   Epidural abscess 09/20/2018    Priority: High   S/P right TKA 05/02/2013    Priority: High   Septic joint of right knee joint ([place]) 11/08/2020   S/P right TKA reimplantation 08/02/2020   Infection of total left knee replacement ([place]) 03/20/2020   Cirrhosis of liver without ascites ([place])    AKI (acute kidney injury) ([place])    Bacteremia    Spinal stenosis of lumbar region without neurogenic claudication    Aspiration pneumonia ([place]) 09/24/2018   Abscess in epidural space of lumbar spine    Anemia of chronic disease 09/20/2018   Hypoalbuminemia 09/20/2018   Hypoglycemia without diagnosis of diabetes mellitus 09/20/2018   Hyperkalemia 05/18/2018   Edentulous 05/11/2018   Pancytopenia ([place]) 05/10/2018   CAD (coronary artery disease) 05/10/2018   Hepatic encephalopathy ([place]) 05/10/2018   Alcohol abuse 05/10/2018   GERD (gastroesophageal reflux disease) 05/10/2018   Duodenal ulcer    Renal failure    Acute on chronic anemia    Hypotension 06/17/2017   Hyponatremia 06/17/2017   Leg edema, right 63/78/5885   Acute metabolic encephalopathy 02/77/4128   Anemia, iron deficiency    Benign neoplasm of ascending colon    Hemorrhoids    Portal hypertensive gastropathy ([name])    Gastritis and gastroduodenitis    Esophageal varices without bleeding ([place])    H/O: CVA (cerebrovascular accident) 12/01/2013   ACS (acute coronary syndrome) ([place]) 12/01/2013   Anasarca 12/01/2013   Polysubstance abuse ([place]) 12/01/2013   CKD (chronic kidney disease) stage 3, GFR 30-59 ml/min ([place]) 12/01/2013   Murmur 04/14/2013   Right inguinal hernia 12/26/2010   Cirrhosis, alcoholic ([place]) 78/67/6720    Patient's Medications  New Prescriptions   No medications on file   Previous Medications   ASPIRIN EC 81 MG TABLET    Take 81 mg by mouth in the morning and at bedtime. Swallow whole.   DOCUSATE SODIUM (COLACE) 100 MG CAPSULE    Take 1 capsule (100 mg total) by mouth 2 (two) times daily.   DOXYCYCLINE (VIBRA-TABS) 100 MG TABLET    Take 100 mg by mouth in the morning and at bedtime.   FERROUS SULFATE 325 (65 FE) MG TABLET    Take 1 tablet (325 mg total) by mouth 2 (two) times daily with a meal. Take for two weeks as tolerated.   FOLIC ACID (FOLVITE) 1 MG TABLET    TAKE 1 TABLET(1 MG) BY MOUTH DAILY   FUROSEMIDE (LASIX) 20 MG TABLET    TAKE 1 TABLET(20 MG) BY MOUTH TWICE DAILY   METHOCARBAMOL (ROBAXIN) 500 MG TABLET    Take 1 tablet (500 mg total) by mouth every 6 (six) hours as needed for muscle spasms.   NADOLOL (CORGARD) 20 MG TABLET    TAKE 1 TABLET BY MOUTH EVERY DAY   OXYCODONE (OXY IR/ROXICODONE) 5 MG IMMEDIATE RELEASE TABLET    Take 1 tablet (5 mg total) by mouth every 6 (six) hours as needed for severe pain or moderate pain.   OXYCODONE HCL 10 MG TABS    Take 10 mg by mouth daily as needed for pain.   PANTOPRAZOLE (PROTONIX) 40 MG TABLET    TAKE 1 TABLET BY MOUTH EVERY DAY  POLYETHYLENE GLYCOL (MIRALAX / GLYCOLAX) 17 G PACKET    Take 17 g by mouth daily.  Modified Medications   Modified Medication Previous Medication   DOXYCYCLINE (VIBRAMYCIN) 100 MG CAPSULE doxycycline (VIBRAMYCIN) 100 MG capsule      Take 1 capsule (100 mg total) by mouth 2 (two) times daily.    Take 1 capsule (100 mg total) by mouth 2 (two) times daily.  Discontinued Medications   No medications on file    Subjective: Tuck is in for his routine hospital follow-up visit.  He has had recurrent bacteremia.  He had group A strep bacteremia in February 2019, MSSA bacteremia in January 2020 and MRSA bacteremia in May 2020.  His last bacteremia was complicated by thoracic spine infection.  He was supposed to be taking chronic doxycycline ever since that hospitalization.  However, he  stopped taking it sometime late 2020.  He is not exactly sure when or why he stopped it.   He began having increased pain, swelling and "popping" in his prosthetic right knee last fall.  He thinks that this started after he quit doxycycline.  He cannot recall who replaced his knee about 8 years ago.  He saw Dr. Lindwood Qua who told him that the prosthesis was infected and loose.  He said that fluid was drained off of the knee.  It was reported the cultures grew MRSA susceptible to doxycycline and daptomycin.  He was admitted to the hospital in late November and underwent resection arthroplasty and spacer placement.  No operative cultures were obtained. He has chronic renal insufficiency.  My partner discharged him on IV daptomycin and he completed 8 weeks of therapy on 05/02/2019 before switching back to chronic, suppressive oral doxycycline.    Unfortunately he stopped taking the doxycycline again.  His wife says that he was off about 3 weeks before he started having recurrent pain and swelling in his right prosthetic knee.  He was hospitalized again and underwent incision and drainage with polyexchange on 11/13/2020.  Cultures grew MRSA again which was still susceptible to doxycycline.  He was treated with IV daptomycin before switching back to chronic, suppressive doxycycline.  He says that he has not been missing doses of his doxycycline.  His chronic back and right knee pain are unchanged.  He tells me that he was recently diagnosed with cataracts.  He was told that he would need surgery then his ophthalmologist said that he could not do surgery because he was worried that his staph infection might complicate the surgery.  Review of Systems: Review of Systems  Constitutional:  Negative for chills, diaphoresis and fever.  Gastrointestinal:  Negative for abdominal pain, diarrhea, nausea and vomiting.  Musculoskeletal:  Positive for back pain and joint pain.   Past Medical History:  Diagnosis Date    Alcohol abuse    Anemia    Arthritis    Ascites    Cirrhosis ([place])    Coffee ground emesis    Dehydration 06/17/2017   Febrile illness    GERD (gastroesophageal reflux disease)    Heart murmur    History of blood transfusion    Hyperlipidemia    Hypertension    Leg swelling    Myocardial infarction ([place]) 2012   Preop cardiovascular exam 04/14/2013   Sepsis ([place]) 06/17/2017   Septic shock ([place]) 06/18/2017   SIRS (systemic inflammatory response syndrome) ([place]) 07/11/2017   Stroke ([place]) 10/2009   TIA   Thrombocytopenia ([place])     Social History  Tobacco Use   Smoking status: Former    Packs/day: 0.50    Years: 10.00    Pack years: 5.00    Types: Cigarettes    Quit date: 01/05/2016    Years since quitting: 5.6   Smokeless tobacco: Never   Tobacco comments:    4 cigarettes a day  Vaping Use   Vaping Use: Never used  Substance Use Topics   Alcohol use: Yes    Comment: 2-3 beers/day   Drug use: Yes    Types: Marijuana    Family History  Problem Relation Age of Onset   Hypertension Father    Diabetes Father    Dementia Mother    Lupus Sister     No Known Allergies  Objective: Vitals:   09/05/21 1055  BP: 138/72  Pulse: 84  Resp: 16  Temp: 98.3 F (36.8 C)  TempSrc: Temporal  SpO2: 99%  Weight: 127 lb (57.6 kg)   Body mass index is 20.5 kg/m.  Physical Exam Constitutional:      Comments: He is talkative and pleasant.  He is accompanied by his wife.  Cardiovascular:     Rate and Rhythm: Normal rate.  Pulmonary:     Effort: Pulmonary effort is normal.  Musculoskeletal:     Comments: His right knee incision has healed nicely.  He has diffuse unchanged swelling of his knee.  His knee is warm to the touch.  There is no unusual erythema.  Psychiatric:        Mood and Affect: Mood normal.    Lab Results Sed Rate  Date Value  11/08/2020 >140 mm/hr (H)  06/12/2020 43 mm/h (H)  03/21/2020 90 mm/hr (H)   CRP  Date Value  11/08/2020 3.6 mg/dL (H)   06/12/2020 1.6 mg/L  03/21/2020 0.6 mg/dL      Problem List Items Addressed This Visit       High   Infection of prosthetic right knee joint ([place])    I told them again that did not think that his MRSA prosthetic knee infection has been cured but is simply being chronically suppressed with doxycycline.  He agrees with continuing to take it indefinitely.  I told him that his infection is not considered a contraindication to cataract surgery.  Follow-up here in 6 months for repeat lab work        Michel Bickers, [place] for [place] [phone] pager   [phone] cell 09/05/2021, 11:15 AM

## 2021-09-05 NOTE — Assessment & Plan Note (Signed)
I told them again that did not think that his MRSA prosthetic knee infection has been cured but is simply being chronically suppressed with doxycycline.  He agrees with continuing to take it indefinitely.  I told him that his infection is not considered a contraindication to cataract surgery.  Follow-up here in 6 months for repeat lab work ?

## 2021-10-23 DIAGNOSIS — H25813 Combined forms of age-related cataract, bilateral: Secondary | ICD-10-CM | POA: Diagnosis not present

## 2021-10-23 DIAGNOSIS — H40023 Open angle with borderline findings, high risk, bilateral: Secondary | ICD-10-CM | POA: Diagnosis not present

## 2021-11-24 ENCOUNTER — Other Ambulatory Visit: Payer: Self-pay | Admitting: Internal Medicine

## 2021-11-25 ENCOUNTER — Other Ambulatory Visit: Payer: Self-pay

## 2022-02-05 ENCOUNTER — Telehealth: Payer: Self-pay

## 2022-02-05 NOTE — Telephone Encounter (Signed)
Patient called stating that his orthopedic doctor wanted him to see Dr.Campbell sooner to have lab work done. Patient rescheduled to tomorrow at 9:30 AM for follow up and labs.    [place], CMA

## 2022-02-05 NOTE — Telephone Encounter (Signed)
Contacted [facility] - most recent office notes will be faxed to our office.    Virgil, CMA

## 2022-02-06 ENCOUNTER — Ambulatory Visit: Payer: BC Managed Care – PPO | Admitting: Internal Medicine

## 2022-02-12 ENCOUNTER — Telehealth: Payer: Self-pay | Admitting: Family Medicine

## 2022-02-12 NOTE — Telephone Encounter (Signed)
[name], NP from [facility] [place] [phone]  Calling to ask MD for the reason Pt is taking  doxycycline (VIBRA-TABS) 100 MG tablet  Please advise.

## 2022-02-13 NOTE — Telephone Encounter (Signed)
Rx'd by ID for pt's infection of prosthetic joint.

## 2022-02-26 ENCOUNTER — Encounter (HOSPITAL_COMMUNITY): Payer: Self-pay

## 2022-02-26 ENCOUNTER — Emergency Department (HOSPITAL_COMMUNITY): Payer: Medicare HMO

## 2022-02-26 ENCOUNTER — Inpatient Hospital Stay (HOSPITAL_COMMUNITY): Payer: Medicare HMO

## 2022-02-26 ENCOUNTER — Inpatient Hospital Stay (HOSPITAL_COMMUNITY)
Admission: EM | Admit: 2022-02-26 | Discharge: 2022-03-03 | DRG: 559 | Disposition: A | Discharge status: Home Health | Payer: Medicare HMO | Attending: Internal Medicine | Admitting: Internal Medicine

## 2022-02-26 ENCOUNTER — Other Ambulatory Visit: Payer: Self-pay

## 2022-02-26 DIAGNOSIS — T8453XA Infection and inflammatory reaction due to internal right knee prosthesis, initial encounter: Secondary | ICD-10-CM | POA: Diagnosis not present

## 2022-02-26 DIAGNOSIS — Z7982 Long term (current) use of aspirin: Secondary | ICD-10-CM | POA: Diagnosis not present

## 2022-02-26 DIAGNOSIS — T8450XA Infection and inflammatory reaction due to unspecified internal joint prosthesis, initial encounter: Secondary | ICD-10-CM | POA: Diagnosis not present

## 2022-02-26 DIAGNOSIS — Z66 Do not resuscitate: Secondary | ICD-10-CM | POA: Diagnosis not present

## 2022-02-26 DIAGNOSIS — Z743 Need for continuous supervision: Secondary | ICD-10-CM | POA: Diagnosis not present

## 2022-02-26 DIAGNOSIS — M47812 Spondylosis without myelopathy or radiculopathy, cervical region: Secondary | ICD-10-CM | POA: Diagnosis not present

## 2022-02-26 DIAGNOSIS — R531 Weakness: Secondary | ICD-10-CM | POA: Diagnosis not present

## 2022-02-26 DIAGNOSIS — G928 Other toxic encephalopathy: Secondary | ICD-10-CM | POA: Diagnosis not present

## 2022-02-26 DIAGNOSIS — Z832 Family history of diseases of the blood and blood-forming organs and certain disorders involving the immune mechanism: Secondary | ICD-10-CM

## 2022-02-26 DIAGNOSIS — Z8249 Family history of ischemic heart disease and other diseases of the circulatory system: Secondary | ICD-10-CM

## 2022-02-26 DIAGNOSIS — Z833 Family history of diabetes mellitus: Secondary | ICD-10-CM

## 2022-02-26 DIAGNOSIS — Z471 Aftercare following joint replacement surgery: Secondary | ICD-10-CM | POA: Diagnosis not present

## 2022-02-26 DIAGNOSIS — N183 Chronic kidney disease, stage 3 unspecified: Secondary | ICD-10-CM | POA: Diagnosis present

## 2022-02-26 DIAGNOSIS — B9562 Methicillin resistant Staphylococcus aureus infection as the cause of diseases classified elsewhere: Secondary | ICD-10-CM | POA: Diagnosis present

## 2022-02-26 DIAGNOSIS — Y831 Surgical operation with implant of artificial internal device as the cause of abnormal reaction of the patient, or of later complication, without mention of misadventure at the time of the procedure: Secondary | ICD-10-CM | POA: Diagnosis not present

## 2022-02-26 DIAGNOSIS — F1910 Other psychoactive substance abuse, uncomplicated: Secondary | ICD-10-CM

## 2022-02-26 DIAGNOSIS — R400 Somnolence: Secondary | ICD-10-CM

## 2022-02-26 DIAGNOSIS — R9431 Abnormal electrocardiogram [ECG] [EKG]: Secondary | ICD-10-CM | POA: Diagnosis not present

## 2022-02-26 DIAGNOSIS — K7030 Alcoholic cirrhosis of liver without ascites: Secondary | ICD-10-CM | POA: Diagnosis present

## 2022-02-26 DIAGNOSIS — R404 Transient alteration of awareness: Secondary | ICD-10-CM | POA: Diagnosis not present

## 2022-02-26 DIAGNOSIS — D696 Thrombocytopenia, unspecified: Secondary | ICD-10-CM | POA: Diagnosis not present

## 2022-02-26 DIAGNOSIS — T68XXXA Hypothermia, initial encounter: Secondary | ICD-10-CM

## 2022-02-26 DIAGNOSIS — I129 Hypertensive chronic kidney disease with stage 1 through stage 4 chronic kidney disease, or unspecified chronic kidney disease: Secondary | ICD-10-CM | POA: Diagnosis not present

## 2022-02-26 DIAGNOSIS — Z96651 Presence of right artificial knee joint: Secondary | ICD-10-CM | POA: Diagnosis not present

## 2022-02-26 DIAGNOSIS — E785 Hyperlipidemia, unspecified: Secondary | ICD-10-CM | POA: Diagnosis present

## 2022-02-26 DIAGNOSIS — F1010 Alcohol abuse, uncomplicated: Secondary | ICD-10-CM | POA: Diagnosis not present

## 2022-02-26 DIAGNOSIS — I252 Old myocardial infarction: Secondary | ICD-10-CM | POA: Diagnosis not present

## 2022-02-26 DIAGNOSIS — Y900 Blood alcohol level of less than 20 mg/100 ml: Secondary | ICD-10-CM | POA: Diagnosis present

## 2022-02-26 DIAGNOSIS — Z20822 Contact with and (suspected) exposure to covid-19: Secondary | ICD-10-CM | POA: Diagnosis present

## 2022-02-26 DIAGNOSIS — Z79899 Other long term (current) drug therapy: Secondary | ICD-10-CM | POA: Diagnosis not present

## 2022-02-26 DIAGNOSIS — A419 Sepsis, unspecified organism: Secondary | ICD-10-CM | POA: Diagnosis not present

## 2022-02-26 DIAGNOSIS — Z8673 Personal history of transient ischemic attack (TIA), and cerebral infarction without residual deficits: Secondary | ICD-10-CM | POA: Diagnosis not present

## 2022-02-26 DIAGNOSIS — A4902 Methicillin resistant Staphylococcus aureus infection, unspecified site: Secondary | ICD-10-CM | POA: Diagnosis not present

## 2022-02-26 DIAGNOSIS — K219 Gastro-esophageal reflux disease without esophagitis: Secondary | ICD-10-CM | POA: Diagnosis not present

## 2022-02-26 DIAGNOSIS — F17210 Nicotine dependence, cigarettes, uncomplicated: Secondary | ICD-10-CM | POA: Diagnosis present

## 2022-02-26 DIAGNOSIS — R188 Other ascites: Secondary | ICD-10-CM | POA: Diagnosis not present

## 2022-02-26 DIAGNOSIS — T8453XD Infection and inflammatory reaction due to internal right knee prosthesis, subsequent encounter: Secondary | ICD-10-CM | POA: Diagnosis not present

## 2022-02-26 DIAGNOSIS — R4182 Altered mental status, unspecified: Secondary | ICD-10-CM | POA: Diagnosis not present

## 2022-02-26 DIAGNOSIS — R69 Illness, unspecified: Secondary | ICD-10-CM | POA: Diagnosis not present

## 2022-02-26 DIAGNOSIS — N179 Acute kidney failure, unspecified: Secondary | ICD-10-CM | POA: Diagnosis not present

## 2022-02-26 DIAGNOSIS — Z96659 Presence of unspecified artificial knee joint: Secondary | ICD-10-CM

## 2022-02-26 LAB — URINALYSIS, ROUTINE W REFLEX MICROSCOPIC
Bilirubin Urine: NEGATIVE
Glucose, UA: NEGATIVE mg/dL
Hgb urine dipstick: NEGATIVE
Ketones, ur: NEGATIVE mg/dL
Leukocytes,Ua: NEGATIVE
Nitrite: NEGATIVE
Protein, ur: NEGATIVE mg/dL
Specific Gravity, Urine: 1.005 (ref 1.005–1.030)
pH: 6 (ref 5.0–8.0)

## 2022-02-26 LAB — CBC WITH DIFFERENTIAL/PLATELET
Abs Immature Granulocytes: 0.01 10*3/uL (ref 0.00–0.07)
Basophils Absolute: 0 10*3/uL (ref 0.0–0.1)
Basophils Relative: 1 %
Eosinophils Absolute: 0.4 10*3/uL (ref 0.0–0.5)
Eosinophils Relative: 9 %
HCT: 27.7 % — ABNORMAL LOW (ref 39.0–52.0)
Hemoglobin: 8.7 g/dL — ABNORMAL LOW (ref 13.0–17.0)
Immature Granulocytes: 0 %
Lymphocytes Relative: 13 %
Lymphs Abs: 0.6 10*3/uL — ABNORMAL LOW (ref 0.7–4.0)
MCH: 31.5 pg (ref 26.0–34.0)
MCHC: 31.4 g/dL (ref 30.0–36.0)
MCV: 100.4 fL — ABNORMAL HIGH (ref 80.0–100.0)
Monocytes Absolute: 0.4 10*3/uL (ref 0.1–1.0)
Monocytes Relative: 8 %
Neutro Abs: 3.1 10*3/uL (ref 1.7–7.7)
Neutrophils Relative %: 69 %
Platelets: 227 10*3/uL (ref 150–400)
RBC: 2.76 MIL/uL — ABNORMAL LOW (ref 4.22–5.81)
RDW: 13.8 % (ref 11.5–15.5)
WBC: 4.4 10*3/uL (ref 4.0–10.5)
nRBC: 0 % (ref 0.0–0.2)

## 2022-02-26 LAB — CBC
HCT: 24.1 % — ABNORMAL LOW (ref 39.0–52.0)
Hemoglobin: 7.6 g/dL — ABNORMAL LOW (ref 13.0–17.0)
MCH: 31.7 pg (ref 26.0–34.0)
MCHC: 31.5 g/dL (ref 30.0–36.0)
MCV: 100.4 fL — ABNORMAL HIGH (ref 80.0–100.0)
Platelets: 203 10*3/uL (ref 150–400)
RBC: 2.4 MIL/uL — ABNORMAL LOW (ref 4.22–5.81)
RDW: 13.6 % (ref 11.5–15.5)
WBC: 5.4 10*3/uL (ref 4.0–10.5)
nRBC: 0 % (ref 0.0–0.2)

## 2022-02-26 LAB — COMPREHENSIVE METABOLIC PANEL
ALT: 12 U/L (ref 0–44)
AST: 26 U/L (ref 15–41)
Albumin: 2.9 g/dL — ABNORMAL LOW (ref 3.5–5.0)
Alkaline Phosphatase: 107 U/L (ref 38–126)
Anion gap: 7 (ref 5–15)
BUN: 36 mg/dL — ABNORMAL HIGH (ref 8–23)
CO2: 21 mmol/L — ABNORMAL LOW (ref 22–32)
Calcium: 8.7 mg/dL — ABNORMAL LOW (ref 8.9–10.3)
Chloride: 107 mmol/L (ref 98–111)
Creatinine, Ser: 1.62 mg/dL — ABNORMAL HIGH (ref 0.61–1.24)
GFR, Estimated: 46 mL/min — ABNORMAL LOW (ref >60)
Glucose, Bld: 102 mg/dL — ABNORMAL HIGH (ref 70–99)
Potassium: 5.4 mmol/L — ABNORMAL HIGH (ref 3.5–5.1)
Sodium: 135 mmol/L (ref 135–145)
Total Bilirubin: 0.5 mg/dL (ref 0.3–1.2)
Total Protein: 7.9 g/dL (ref 6.5–8.1)

## 2022-02-26 LAB — TROPONIN I (HIGH SENSITIVITY)
Troponin I (High Sensitivity): 14 ng/L (ref <18)
Troponin I (High Sensitivity): 13 ng/L (ref <18)

## 2022-02-26 LAB — SALICYLATE LEVEL: Salicylate Lvl: <7 mg/dL — ABNORMAL LOW (ref 7.0–30.0)

## 2022-02-26 LAB — RAPID URINE DRUG SCREEN, HOSP PERFORMED
Amphetamines: NOT DETECTED
Barbiturates: NOT DETECTED
Benzodiazepines: POSITIVE — AB
Cocaine: NOT DETECTED
Opiates: POSITIVE — AB
Tetrahydrocannabinol: POSITIVE — AB

## 2022-02-26 LAB — RESP PANEL BY RT-PCR (FLU A&B, COVID) ARPGX2
Influenza A by PCR: NEGATIVE
Influenza B by PCR: NEGATIVE
SARS Coronavirus 2 by RT PCR: NEGATIVE

## 2022-02-26 LAB — AMMONIA: Ammonia: 61 umol/L — ABNORMAL HIGH (ref 9–35)

## 2022-02-26 LAB — SYNOVIAL CELL COUNT + DIFF, W/ CRYSTALS
Crystals, Fluid: NONE SEEN
Eosinophils-Synovial: 1 % (ref 0–1)
Lymphocytes-Synovial Fld: 3 % (ref 0–20)
Monocyte-Macrophage-Synovial Fluid: 9 % — ABNORMAL LOW (ref 50–90)
Neutrophil, Synovial: 87 % — ABNORMAL HIGH (ref 0–25)
WBC, Synovial: 11005 /mm3 — ABNORMAL HIGH (ref 0–200)

## 2022-02-26 LAB — LACTIC ACID, PLASMA
Lactic Acid, Venous: 1.4 mmol/L (ref 0.5–1.9)
Lactic Acid, Venous: 1 mmol/L (ref 0.5–1.9)
Lactic Acid, Venous: 1.3 mmol/L (ref 0.5–1.9)

## 2022-02-26 LAB — CREATININE, SERUM
Creatinine, Ser: 1.42 mg/dL — ABNORMAL HIGH (ref 0.61–1.24)
GFR, Estimated: 54 mL/min — ABNORMAL LOW (ref >60)

## 2022-02-26 LAB — LIPASE, BLOOD: Lipase: 28 U/L (ref 11–51)

## 2022-02-26 LAB — ACETAMINOPHEN LEVEL: Acetaminophen (Tylenol), Serum: <10 ug/mL — ABNORMAL LOW (ref 10–30)

## 2022-02-26 LAB — HIV ANTIBODY (ROUTINE TESTING W REFLEX): HIV Screen 4th Generation wRfx: NONREACTIVE

## 2022-02-26 LAB — MRSA NEXT GEN BY PCR, NASAL: MRSA by PCR Next Gen: NOT DETECTED

## 2022-02-26 LAB — ETHANOL: Alcohol, Ethyl (B): <10 mg/dL (ref <10)

## 2022-02-26 MED ORDER — LORAZEPAM 2 MG/ML IJ SOLN
0.0000 mg | Freq: Two times a day (BID) | INTRAMUSCULAR | Status: DC
  Administered 2022-02-28: 2 mg via INTRAVENOUS
  Filled 2022-02-26: qty 1

## 2022-02-26 MED ORDER — SODIUM CHLORIDE 0.9 % IV SOLN
2.0000 g | Freq: Two times a day (BID) | INTRAVENOUS | Status: DC
  Administered 2022-02-26 – 2022-02-28 (×5): 2 g via INTRAVENOUS
  Filled 2022-02-26 (×5): qty 12.5

## 2022-02-26 MED ORDER — VANCOMYCIN HCL IN DEXTROSE 1-5 GM/200ML-% IV SOLN
1000.0000 mg | Freq: Once | INTRAVENOUS | Status: AC
  Administered 2022-02-26: 1000 mg via INTRAVENOUS
  Filled 2022-02-26: qty 200

## 2022-02-26 MED ORDER — LORAZEPAM 2 MG/ML IJ SOLN
1.0000 mg | INTRAMUSCULAR | Status: AC | PRN
  Administered 2022-02-28: 2 mg via INTRAVENOUS
  Administered 2022-02-28: 1 mg via INTRAVENOUS
  Filled 2022-02-26 (×2): qty 1

## 2022-02-26 MED ORDER — ADULT MULTIVITAMIN W/MINERALS CH
1.0000 | ORAL_TABLET | Freq: Every day | ORAL | Status: DC
  Administered 2022-02-28 – 2022-03-03 (×4): 1 via ORAL
  Filled 2022-02-26 (×4): qty 1

## 2022-02-26 MED ORDER — METRONIDAZOLE 500 MG/100ML IV SOLN
500.0000 mg | Freq: Two times a day (BID) | INTRAVENOUS | Status: DC
  Administered 2022-02-26 – 2022-02-28 (×5): 500 mg via INTRAVENOUS
  Filled 2022-02-26 (×5): qty 100

## 2022-02-26 MED ORDER — NALOXONE HCL 0.4 MG/ML IJ SOLN
0.4000 mg | Freq: Once | INTRAMUSCULAR | Status: AC
  Administered 2022-02-26: 0.4 mg via INTRAVENOUS
  Filled 2022-02-26: qty 1

## 2022-02-26 MED ORDER — METRONIDAZOLE 500 MG/100ML IV SOLN
500.0000 mg | Freq: Once | INTRAVENOUS | Status: AC
  Administered 2022-02-26: 500 mg via INTRAVENOUS
  Filled 2022-02-26: qty 100

## 2022-02-26 MED ORDER — HEPARIN SODIUM (PORCINE) 5000 UNIT/ML IJ SOLN
5000.0000 [IU] | Freq: Three times a day (TID) | INTRAMUSCULAR | Status: DC
  Administered 2022-02-26 – 2022-03-03 (×14): 5000 [IU] via SUBCUTANEOUS
  Filled 2022-02-26 (×14): qty 1

## 2022-02-26 MED ORDER — LACTATED RINGERS IV SOLN: INTRAVENOUS | Status: AC

## 2022-02-26 MED ORDER — THIAMINE MONONITRATE 100 MG PO TABS
100.0000 mg | ORAL_TABLET | Freq: Every day | ORAL | Status: DC
  Administered 2022-02-28 – 2022-03-03 (×4): 100 mg via ORAL
  Filled 2022-02-26 (×4): qty 1

## 2022-02-26 MED ORDER — SODIUM CHLORIDE 0.9 % IV SOLN: 2.0000 g | Freq: Once | INTRAVENOUS | Status: DC

## 2022-02-26 MED ORDER — LACTULOSE 10 GM/15ML PO SOLN
10.0000 g | Freq: Once | ORAL | Status: AC
  Administered 2022-02-26: 10 g via ORAL
  Filled 2022-02-26: qty 30

## 2022-02-26 MED ORDER — LORAZEPAM 1 MG PO TABS: 1.0000 mg | ORAL_TABLET | ORAL | Status: AC | PRN

## 2022-02-26 MED ORDER — THIAMINE HCL 100 MG/ML IJ SOLN
100.0000 mg | Freq: Every day | INTRAMUSCULAR | Status: DC
  Administered 2022-02-26 – 2022-02-27 (×2): 100 mg via INTRAVENOUS
  Filled 2022-02-26 (×2): qty 2

## 2022-02-26 MED ORDER — SODIUM CHLORIDE 0.9 % IV BOLUS
1000.0000 mL | Freq: Once | INTRAVENOUS | Status: AC
  Administered 2022-02-26: 1000 mL via INTRAVENOUS

## 2022-02-26 MED ORDER — METRONIDAZOLE 500 MG/100ML IV SOLN: 500.0000 mg | Freq: Two times a day (BID) | INTRAVENOUS | Status: DC

## 2022-02-26 MED ORDER — LACTATED RINGERS IV SOLN: INTRAVENOUS | Status: DC

## 2022-02-26 MED ORDER — VANCOMYCIN HCL 750 MG/150ML IV SOLN
750.0000 mg | INTRAVENOUS | Status: DC
  Administered 2022-02-27 – 2022-02-28 (×2): 750 mg via INTRAVENOUS
  Filled 2022-02-26 (×2): qty 150

## 2022-02-26 MED ORDER — LORAZEPAM 2 MG/ML IJ SOLN
0.0000 mg | Freq: Four times a day (QID) | INTRAMUSCULAR | Status: AC
  Administered 2022-02-27: 2 mg via INTRAVENOUS
  Administered 2022-02-27: 3 mg via INTRAVENOUS
  Administered 2022-02-28: 2 mg via INTRAVENOUS
  Administered 2022-02-28: 1 mg via INTRAVENOUS
  Filled 2022-02-26: qty 2
  Filled 2022-02-26 (×3): qty 1

## 2022-02-26 MED ORDER — VANCOMYCIN HCL IN DEXTROSE 1-5 GM/200ML-% IV SOLN: 1000.0000 mg | Freq: Once | INTRAVENOUS | Status: DC

## 2022-02-26 MED ORDER — FOLIC ACID 1 MG PO TABS
1.0000 mg | ORAL_TABLET | Freq: Every day | ORAL | Status: DC
  Administered 2022-02-28 – 2022-03-03 (×4): 1 mg via ORAL
  Filled 2022-02-26 (×4): qty 1

## 2022-02-26 MED ORDER — SODIUM CHLORIDE 0.9 % IV SOLN
2.0000 g | Freq: Once | INTRAVENOUS | Status: AC
  Administered 2022-02-26: 2 g via INTRAVENOUS
  Filled 2022-02-26: qty 12.5

## 2022-02-26 NOTE — ED Notes (Signed)
Bair hugger reapplied

## 2022-02-26 NOTE — Consult Note (Signed)
Reason for Consult: Sepsis possible infection right knee Referring Physician: Dr. Sherrian Divers.                                                        HPI: Patient 66 year old male was admitted today with a sepsis of unclear etiology.  Though the patient was afebrile and vital signs are stable and a normal white count he had a painful swollen knee.  He was given vancomycin and third-generation cephalosporin in the emergency room.  The family report he is had pain and swelling in his knee since revision surgery over a year ago by Dr. Alvan Dame.  We were consulted for an evaluation.  Past Medical History:  Diagnosis Date   Alcohol abuse    Anemia    Arthritis    Ascites    Cirrhosis ([place])    Coffee ground emesis    Dehydration 06/17/2017   Febrile illness    GERD (gastroesophageal reflux disease)    Heart murmur    History of blood transfusion    Hyperlipidemia    Hypertension    Leg swelling    Myocardial infarction ([place]) 2012   Preop cardiovascular exam 04/14/2013   Sepsis ([place]) 06/17/2017   Septic shock ([place]) 06/18/2017   SIRS (systemic inflammatory response syndrome) ([place]) 07/11/2017   Stroke ([place]) 10/2009   TIA   Thrombocytopenia ([place])     Past Surgical History:  Procedure Laterality Date   COLONOSCOPY WITH PROPOFOL N/A 02/07/2016   Procedure: COLONOSCOPY WITH PROPOFOL;  Surgeon: Milus Banister, MD;  Location: [facility];  Service: Endoscopy;  Laterality: N/A;   ESOPHAGOGASTRODUODENOSCOPY (EGD) WITH PROPOFOL N/A 02/07/2016   Procedure: ESOPHAGOGASTRODUODENOSCOPY (EGD) WITH PROPOFOL;  Surgeon: Milus Banister, MD;  [facility];  Service: Endoscopy;  Laterality: N/A;   ESOPHAGOGASTRODUODENOSCOPY (EGD) WITH PROPOFOL N/A 06/22/2017   Procedure: ESOPHAGOGASTRODUODENOSCOPY (EGD) WITH PROPOFOL;  Surgeon: Jerene Bears, MD;  Location: [facility] ENDOSCOPY;  Service: Gastroenterology;  Laterality: N/A;   EXCISIONAL TOTAL KNEE ARTHROPLASTY WITH ANTIBIOTIC SPACERS Right 03/20/2020   Procedure:  Resection right total knee arthroplasty and placement of antibiotic spacer;  Surgeon: Paralee Cancel, MD;  [facility]: [facility];  Service: Orthopedics;  Laterality: Right;  90 mins   HERNIA REPAIR Right    inguinal   INGUINAL HERNIA REPAIR Left 02/08/2021   Procedure: OPEN LEFT INGUINAL HERNIA REPAIR WITH MESH;  Surgeon: Coralie Keens, MD;  Location: [facility];  Service: General;  Laterality: Left;   IRRIGATION AND DEBRIDEMENT KNEE Right 11/13/2020   Procedure: IRRIGATION AND DEBRIDEMENT KNEE;  Surgeon: Paralee Cancel, MD;  [facility];  Service: Orthopedics;  Laterality: Right;   KNEE ARTHROSCOPY     bilateral/  12/14   REIMPLANTATION OF TOTAL KNEE Right 08/02/2020   Procedure: REIMPLANTATION/REVISION OF TOTAL KNEE WITH REMOVAL OF ANTIBIOTIC SPACER;  Surgeon: Paralee Cancel, MD;  [facility];  Service: Orthopedics;  Laterality: Right;  46mns   TEE WITHOUT CARDIOVERSION N/A 05/13/2018   Procedure: TRANSESOPHAGEAL ECHOCARDIOGRAM (TEE);  Surgeon: AElouise Munroe MD;  Location: [facility]  Service: Cardiovascular;  Laterality: N/A;   TOTAL KNEE ARTHROPLASTY Right 05/02/2013   Procedure: RIGHT TOTAL KNEE ARTHROPLASTY;  Surgeon: MMauri Pole MD;  [facility];  Service: Orthopedics;  Laterality: Right;    Family History  Problem Relation Age of Onset  Hypertension Father    Diabetes Father    Dementia Mother    Lupus Sister     Social History:  reports that he quit smoking about 6 years ago. His smoking use included cigarettes. He has a 5.00 pack-year smoking history. He has never used smokeless tobacco. He reports current alcohol use. He reports current drug use. Drug: Marijuana.  Allergies: No Known Allergies  Medications: I have reviewed the patient's current medications.  Results for orders placed or performed during the hospital encounter of 02/26/22 (from the past 48 hour(s))  Acetaminophen level     Status: Abnormal   Collection Time: 02/26/22 11:20 AM  Result Value  Ref Range   Acetaminophen (Tylenol), Serum <10 (L) 10 - 30 ug/mL    Comment: (NOTE) Therapeutic concentrations vary significantly. A range of 10-30 ug/mL  may be an effective concentration for many patients. However, some  are best treated at concentrations outside of this range. Acetaminophen concentrations >150 ug/mL at 4 hours after ingestion  and >50 ug/mL at 12 hours after ingestion are often associated with  toxic reactions.  Performed at [facility], [place] [address], [place], [place] [address]   Ethanol     Status: None   Collection Time: 02/26/22 11:20 AM  Result Value Ref Range   Alcohol, Ethyl (B) <10 <10 mg/dL    Comment: (NOTE) Lowest detectable limit for serum alcohol is 10 mg/dL.  For medical purposes only. Performed at [facility], [place] [address], [place], [place] [address]   Comprehensive metabolic panel     Status: Abnormal   Collection Time: 02/26/22 11:20 AM  Result Value Ref Range   Sodium 135 135 - 145 mmol/L   Potassium 5.4 (H) 3.5 - 5.1 mmol/L   Chloride 107 98 - 111 mmol/L   CO2 21 (L) 22 - 32 mmol/L   Glucose, Bld 102 (H) 70 - 99 mg/dL    Comment: Glucose reference range applies only to samples taken after fasting for at least 8 hours.   BUN 36 (H) 8 - 23 mg/dL   Creatinine, Ser 1.62 (H) 0.61 - 1.24 mg/dL   Calcium 8.7 (L) 8.9 - 10.3 mg/dL   Total Protein 7.9 6.5 - 8.1 g/dL   Albumin 2.9 (L) 3.5 - 5.0 g/dL   AST 26 15 - 41 U/L   ALT 12 0 - 44 U/L   Alkaline Phosphatase 107 38 - 126 U/L   Total Bilirubin 0.5 0.3 - 1.2 mg/dL   GFR, Estimated 46 (L) >60 mL/min    Comment: (NOTE) Calculated using the CKD-EPI Creatinine Equation (2021)    Anion gap 7 5 - 15    Comment: Performed at [facility], [place] [address] [place], [place] [address]  Lactic acid, plasma     Status: None   Collection Time: 02/26/22 11:20 AM  Result Value Ref Range   Lactic Acid, Venous 1.4 0.5 - 1.9 mmol/L    Comment:  Performed at [facility], [place] [address], [place], [place] [address]  Lipase, blood     Status: None   Collection Time: 02/26/22 11:20 AM  Result Value Ref Range   Lipase 28 11 - 51 U/L    Comment: Performed at [facility], [place] [address] [place], [place] [address]  Salicylate level     Status: Abnormal   Collection Time: 02/26/22 11:20 AM  Result Value Ref Range   Salicylate Lvl <5.7 (L) 7.0 -  30.0 mg/dL    Comment: Performed at [facility], [place] [address], [place], [place] [address]  Troponin I (High Sensitivity)     Status: None   Collection Time: 02/26/22 11:20 AM  Result Value Ref Range   Troponin I (High Sensitivity) 14 <18 ng/L    Comment: (NOTE) Elevated high sensitivity troponin I (hsTnI) values and significant  changes across serial measurements may suggest ACS but many other  chronic and acute conditions are known to elevate hsTnI results.  Refer to the "Links" section for chest pain algorithms and additional  guidance. Performed at [facility], [place] [address], [place], [place] [address]   CBC with Differential     Status: Abnormal   Collection Time: 02/26/22 11:20 AM  Result Value Ref Range   WBC 4.4 4.0 - 10.5 K/uL   RBC 2.76 (L) 4.22 - 5.81 MIL/uL   Hemoglobin 8.7 (L) 13.0 - 17.0 g/dL   HCT 27.7 (L) 39.0 - 52.0 %   MCV 100.4 (H) 80.0 - 100.0 fL   MCH 31.5 26.0 - 34.0 pg   MCHC 31.4 30.0 - 36.0 g/dL   RDW 13.8 11.5 - 15.5 %   Platelets 227 150 - 400 K/uL   nRBC 0.0 0.0 - 0.2 %   Neutrophils Relative % 69 %   Neutro Abs 3.1 1.7 - 7.7 K/uL   Lymphocytes Relative 13 %   Lymphs Abs 0.6 (L) 0.7 - 4.0 K/uL   Monocytes Relative 8 %   Monocytes Absolute 0.4 0.1 - 1.0 K/uL   Eosinophils Relative 9 %   Eosinophils Absolute 0.4 0.0 - 0.5 K/uL   Basophils Relative 1 %   Basophils Absolute 0.0 0.0 - 0.1 K/uL   Immature Granulocytes 0 %   Abs Immature Granulocytes 0.01 0.00 - 0.07 K/uL     Comment: Performed at [facility], [place] [address], [place], [place] [address]  Ammonia     Status: Abnormal   Collection Time: 02/26/22 11:20 AM  Result Value Ref Range   Ammonia 61 (H) 9 - 35 umol/L    Comment: Performed at [facility], [place] [address] [place], [place] [address]  Resp Panel by RT-PCR (Flu A&B, Covid) Anterior Nasal Swab     Status: None   Collection Time: 02/26/22 11:20 AM   Specimen: Anterior Nasal Swab  Result Value Ref Range   SARS Coronavirus 2 by RT PCR NEGATIVE NEGATIVE    Comment: (NOTE) SARS-CoV-2 target nucleic acids are NOT DETECTED.  The SARS-CoV-2 RNA is generally detectable in upper respiratory specimens during the acute phase of infection. The lowest concentration of SARS-CoV-2 viral copies this assay can detect is 138 copies/mL. A negative result does not preclude SARS-Cov-2 infection and should not be used as the sole basis for treatment or other patient management decisions. A negative result may occur with  improper specimen collection/handling, submission of specimen other than nasopharyngeal swab, presence of viral mutation(s) within the areas targeted by this assay, and inadequate number of viral copies(<138 copies/mL). A negative result must be combined with clinical observations, patient history, and epidemiological information. The expected result is Negative.  Fact Sheet for Patients:  EntrepreneurPulse.com.au  Fact Sheet for Healthcare Providers:  IncredibleEmployment.be  This test is no t yet approved or cleared by the [place] FDA and  has been authorized for detection and/or diagnosis of SARS-CoV-2 by FDA under an Emergency Use Authorization (EUA). This EUA will remain  in effect (  meaning this test can be used) for the duration of the COVID-19 declaration under Section 564(b)(1) of the Act, 21 U.S.C.section 360bbb-3(b)(1), unless the authorization is  terminated  or revoked sooner.       Influenza A by PCR NEGATIVE NEGATIVE   Influenza B by PCR NEGATIVE NEGATIVE    Comment: (NOTE) The Xpert Xpress SARS-CoV-2/FLU/RSV plus assay is intended as an aid in the diagnosis of influenza from Nasopharyngeal swab specimens and should not be used as a sole basis for treatment. Nasal washings and aspirates are unacceptable for Xpert Xpress SARS-CoV-2/FLU/RSV testing.  Fact Sheet for Patients: EntrepreneurPulse.com.au  Fact Sheet for Healthcare Providers: IncredibleEmployment.be  This test is not yet approved or cleared by the [place] FDA and has been authorized for detection and/or diagnosis of SARS-CoV-2 by FDA under an Emergency Use Authorization (EUA). This EUA will remain in effect (meaning this test can be used) for the duration of the COVID-19 declaration under Section 564(b)(1) of the Act, 21 U.S.C. section 360bbb-3(b)(1), unless the authorization is terminated or revoked.  Performed at [facility], [place] [address] [place], [place] [address]   Troponin I (High Sensitivity)     Status: None   Collection Time: 02/26/22  1:20 PM  Result Value Ref Range   Troponin I (High Sensitivity) 13 <18 ng/L    Comment: (NOTE) Elevated high sensitivity troponin I (hsTnI) values and significant  changes across serial measurements may suggest ACS but many other  chronic and acute conditions are known to elevate hsTnI results.  Refer to the "Links" section for chest pain algorithms and additional  guidance. Performed at [facility], [place] [address], [place], [place] [address]   Urinalysis, Routine w reflex microscopic Urine, Clean Catch     Status: None   Collection Time: 02/26/22  1:55 PM  Result Value Ref Range   Color, Urine YELLOW YELLOW   APPearance CLEAR CLEAR   Specific Gravity, Urine 1.005 1.005 - 1.030   pH 6.0 5.0 - 8.0   Glucose, UA NEGATIVE  NEGATIVE mg/dL   Hgb urine dipstick NEGATIVE NEGATIVE   Bilirubin Urine NEGATIVE NEGATIVE   Ketones, ur NEGATIVE NEGATIVE mg/dL   Protein, ur NEGATIVE NEGATIVE mg/dL   Nitrite NEGATIVE NEGATIVE   Leukocytes,Ua NEGATIVE NEGATIVE    Comment: Performed at [facility], [place] [address], [place], [place] [address]  Urine rapid drug screen (hosp performed)     Status: Abnormal   Collection Time: 02/26/22  1:55 PM  Result Value Ref Range   Opiates POSITIVE (A) NONE DETECTED   Cocaine NONE DETECTED NONE DETECTED   Benzodiazepines POSITIVE (A) NONE DETECTED   Amphetamines NONE DETECTED NONE DETECTED   Tetrahydrocannabinol POSITIVE (A) NONE DETECTED   Barbiturates NONE DETECTED NONE DETECTED    Comment: (NOTE) DRUG SCREEN FOR MEDICAL PURPOSES ONLY.  IF CONFIRMATION IS NEEDED FOR ANY PURPOSE, NOTIFY LAB WITHIN 5 DAYS.  LOWEST DETECTABLE LIMITS FOR URINE DRUG SCREEN Drug Class                     Cutoff (ng/mL) Amphetamine and metabolites    1000 Barbiturate and metabolites    200 Benzodiazepine                 200 Opiates and metabolites        300 Cocaine and metabolites        300 THC  50 Performed at [facility], [place] [address], [place], [place] [address]   CBC     Status: Abnormal   Collection Time: 02/26/22  3:02 PM  Result Value Ref Range   WBC 5.4 4.0 - 10.5 K/uL   RBC 2.40 (L) 4.22 - 5.81 MIL/uL   Hemoglobin 7.6 (L) 13.0 - 17.0 g/dL   HCT 24.1 (L) 39.0 - 52.0 %   MCV 100.4 (H) 80.0 - 100.0 fL   MCH 31.7 26.0 - 34.0 pg   MCHC 31.5 30.0 - 36.0 g/dL   RDW 13.6 11.5 - 15.5 %   Platelets 203 150 - 400 K/uL   nRBC 0.0 0.0 - 0.2 %    Comment: Performed at [facility], [place] [address] [place], [place] [address]  Creatinine, serum     Status: Abnormal   Collection Time: 02/26/22  3:02 PM  Result Value Ref Range   Creatinine, Ser 1.42 (H) 0.61 - 1.24 mg/dL   GFR, Estimated 54 (L) >60 mL/min     Comment: (NOTE) Calculated using the CKD-EPI Creatinine Equation (2021) Performed at [facility], [place] [address], [place], [place] [address]   Lactic acid, plasma     Status: None   Collection Time: 02/26/22  3:02 PM  Result Value Ref Range   Lactic Acid, Venous 1.0 0.5 - 1.9 mmol/L    Comment: Performed at [facility], [place] [address] [place], [place] [address]  Lactic acid, plasma     Status: None   Collection Time: 02/26/22  5:36 PM  Result Value Ref Range   Lactic Acid, Venous 1.3 0.5 - 1.9 mmol/L    Comment: Performed at [facility], [place] [address] [place], [place] [address]  MRSA Next Gen by PCR, Nasal     Status: None   Collection Time: 02/26/22  6:22 PM   Specimen: Nasal Mucosa; Nasal Swab  Result Value Ref Range   MRSA by PCR Next Gen NOT DETECTED NOT DETECTED    Comment: (NOTE) The GeneXpert MRSA Assay (FDA approved for NASAL specimens only), is one component of a comprehensive MRSA colonization surveillance program. It is not intended to diagnose MRSA infection nor to guide or monitor treatment for MRSA infections. Test performance is not FDA approved in patients less than 61 years old. Performed at [facility], [place] [address] [place], [place] [address]     DG Knee 1-2 Views Right  Result Date: 02/26/2022 CLINICAL DATA:  [id number] Pain and swelling of right knee [id number] EXAM: RIGHT KNEE - 1-2 VIEW COMPARISON:  Radiograph 11/08/2020 FINDINGS: Long-stem total knee arthroplasty. Interval bone erosion/resorption adjacent to the medial tibial tray and along the lateral distal femoral metaphysis and lateral femoral condyle. There is periosteal reaction along the patella and medial femoral metaphysis/medial femoral condyle with bony thinning and sclerosis. There is soft tissue swelling along the knee and a moderate-sized joint effusion. IMPRESSION: Right total knee arthroplasty with findings of  prosthetic joint infection. Extensive soft tissue swelling with moderate-sized joint effusion. Electronically Signed   By: Maurine Simmering M.D.   On: 02/26/2022 15:50   CT Cervical Spine Wo Contrast  Result Date: 02/26/2022 CLINICAL DATA:  Trauma, altered mental status EXAM: CT CERVICAL SPINE WITHOUT CONTRAST TECHNIQUE: Multidetector CT imaging of the cervical spine was performed without intravenous contrast. Multiplanar CT image reconstructions were also generated. RADIATION DOSE REDUCTION: This exam was performed according to the departmental dose-optimization program which includes automated  exposure control, adjustment of the mA and/or kV according to patient size and/or use of iterative reconstruction technique. COMPARISON:  None Available. FINDINGS: There is patient motion in the images limiting the study. Alignment: Alignment of posterior margins of vertebral bodies is unremarkable. Skull base and vertebrae: No recent fracture is seen. Evaluation for undisplaced fractures is limited due to motion artifacts. Degenerative changes are noted in cervical spine, more so at C6-C7 level. Soft tissues and spinal canal: There is no central spinal stenosis. Disc levels: There is encroachment of neural foramina at C5-C6 and C6-C7 levels, more so on the left side at C6-C7 level. Upper chest: Unremarkable. Other: Arterial calcifications are seen, more so at common carotid bifurcations on both sides. IMPRESSION: Motion limited study. As far as seen, no displaced fracture is seen. Cervical spondylosis with encroachment of neural foramina at C5-C6 and C6-C7 levels, more so on the left side at C6-C7 level. Arteriosclerosis. Electronically Signed   By: Elmer Picker M.D.   On: 02/26/2022 12:58   CT Head Wo Contrast  Result Date: 02/26/2022 CLINICAL DATA:  Altered mental status EXAM: CT HEAD WITHOUT CONTRAST TECHNIQUE: Contiguous axial images were obtained from the base of the skull through the vertex without  intravenous contrast. RADIATION DOSE REDUCTION: This exam was performed according to the departmental dose-optimization program which includes automated exposure control, adjustment of the mA and/or kV according to patient size and/or use of iterative reconstruction technique. COMPARISON:  12/22/2018 FINDINGS: Brain: No acute intracranial findings are seen. There are no signs of bleeding. Ventricles are not dilated. Cortical sulci are prominent. There is decreased density in periventricular white matter. Vascular: Scattered arterial calcifications are seen. Skull: Unremarkable. Sinuses/Orbits: Small air-fluid level is seen in left maxillary sinus. There is mucosal thickening in the ethmoid sinus. Other: None. IMPRESSION: No acute intracranial findings are seen. Atrophy. Small-vessel disease. Chronic ethmoid sinusitis. Small air-fluid level in left maxillary sinus suggests possible mild acute sinusitis. Electronically Signed   By: Elmer Picker M.D.   On: 02/26/2022 12:54   DG Chest Portable 1 View  Result Date: 02/26/2022 CLINICAL DATA:  Altered mental status. EXAM: PORTABLE CHEST 1 VIEW COMPARISON:  Chest x-ray dated October 01, 2018. FINDINGS: The heart size and mediastinal contours are within normal limits. Both lungs are clear. The visualized skeletal structures are unremarkable. IMPRESSION: No active disease. Electronically Signed   By: Titus Dubin M.D.   On: 02/26/2022 12:02    ROS Blood pressure 126/73, pulse 87, temperature (!) 96.9 F (36.1 C), temperature source Axillary, resp. rate 11, height '5\' 6"'$  (1.676 m), weight 57 kg, SpO2 96 %.  Physical Exam Was not alert.  Though he was responsive to pain.  Right leg he has swelling in the right knee.  He has a mild effusion to moderate effusion of the knee.  He has pain with attempted range.  He does not respond to commands.  He had a dorsalis pedis posterior tibial pulse by nursing which is 1+ by Doppler.  His foot is warm.  Compartments are  soft.  There is no proximal swelling into the thigh or into the leg.  He does have a slight instability with a valgus stress to the knee.  He will not perform active straight leg raise or obey commands.  The x-ray of his knee demonstrates a longstem prosthesis with evidence of loosening moderate joint effusion.  Periosteal reaction about the patella and medial femoral condyle.    Assessment/Plan:  1.  Sepsis with right knee  status post total knee replacement probable subacute infection though currently the patient's white count is normal and dynamically stable. 2.  Loosening of the longstem prosthesis status post total knee replacement 3.  Encephalopathy patient obtunded with history of EtOH abuse, possible substance abuse  I proceeded with an aspiration of the right knee after strict sterile prep utilizing ChloraPrep sterile gloves.  I aspirated approximately 10 cc of a serobloody fluid without evidence of purulence.  This was sent for culture, Gram stain cell count etc.  Agree with broad-spectrum antibiotic coverage pending culture results.  I will speak with Dr. Alvan Dame concerning the right knee which is highly probable for acute on chronic prosthetic infection. This will likely require revision and resection and reimplantation if his comorbidities do not preclude a surgical procedure.  We will follow  Discussed the patient's condition with his wife who I was able to get in touch with on her home phone.  I discussed his condition as it relates to the knee and the aspiration that we performed.  She encouraged [place] to do all we needed to do to help him.  Addendum:  Gram stain with over 11,000 red cells seen.  No organisms.  Cultures pending.  Johnn Hai 02/26/2022, 8:10 PM   [phone]

## 2022-02-26 NOTE — ED Notes (Signed)
Bair hugger removed. Patient is responsive to verbal stimuli.

## 2022-02-26 NOTE — ED Notes (Addendum)
Wet clothing removed, Retail banker placed. Patient responsive to sternum rub.

## 2022-02-26 NOTE — ED Notes (Signed)
RN aware of temp. Bair hugger placed on pt.

## 2022-02-26 NOTE — ED Notes (Signed)
MD [place] aware of patient and vitals.

## 2022-02-26 NOTE — Consult Note (Deleted)
Reason for Consult:R knee pain, swelling. Prior hx of TKA by Dr. Alvan Dame Referring Physician: Dr. Durwin Glaze. is an 66 y.o. male.  HPI: Pt presented to ER today with altered mental status, admitted with encephalopathy. Unknown source of sepsis. Prior hx of total knee replacement by Dr. Alvan Dame, post-op infx which required I&D, IV abx, suppressive therapy, s/p revision and reimplantation Dec 2022. Per spouse pt reports ongoing R knee pain. Concern for source of sepsis given pain and swelling.  Past Medical History:  Diagnosis Date   Alcohol abuse    Anemia    Arthritis    Ascites    Cirrhosis ([place])    Coffee ground emesis    Dehydration 06/17/2017   Febrile illness    GERD (gastroesophageal reflux disease)    Heart murmur    History of blood transfusion    Hyperlipidemia    Hypertension    Leg swelling    Myocardial infarction ([place]) 2012   Preop cardiovascular exam 04/14/2013   Sepsis ([place]) 06/17/2017   Septic shock ([place]) 06/18/2017   SIRS (systemic inflammatory response syndrome) ([place]) 07/11/2017   Stroke ([place]) 10/2009   TIA   Thrombocytopenia ([place])     Past Surgical History:  Procedure Laterality Date   COLONOSCOPY WITH PROPOFOL N/A 02/07/2016   Procedure: COLONOSCOPY WITH PROPOFOL;  Surgeon: Milus Banister, MD;  [facility];  Service: Endoscopy;  Laterality: N/A;   ESOPHAGOGASTRODUODENOSCOPY (EGD) WITH PROPOFOL N/A 02/07/2016   Procedure: ESOPHAGOGASTRODUODENOSCOPY (EGD) WITH PROPOFOL;  Surgeon: Milus Banister, MD;  [facility];  Service: Endoscopy;  Laterality: N/A;   ESOPHAGOGASTRODUODENOSCOPY (EGD) WITH PROPOFOL N/A 06/22/2017   Procedure: ESOPHAGOGASTRODUODENOSCOPY (EGD) WITH PROPOFOL;  Surgeon: Jerene Bears, MD;  Location: [facility] ENDOSCOPY;  Service: Gastroenterology;  Laterality: N/A;   EXCISIONAL TOTAL KNEE ARTHROPLASTY WITH ANTIBIOTIC SPACERS Right 03/20/2020   Procedure: Resection right total knee arthroplasty and placement of  antibiotic spacer;  Surgeon: Paralee Cancel, MD;  [facility]: [facility];  Service: Orthopedics;  Laterality: Right;  90 mins   HERNIA REPAIR Right    inguinal   INGUINAL HERNIA REPAIR Left 02/08/2021   Procedure: OPEN LEFT INGUINAL HERNIA REPAIR WITH MESH;  Surgeon: Coralie Keens, MD;  Location: [facility];  Service: General;  Laterality: Left;   IRRIGATION AND DEBRIDEMENT KNEE Right 11/13/2020   Procedure: IRRIGATION AND DEBRIDEMENT KNEE;  Surgeon: Paralee Cancel, MD;  [facility];  Service: Orthopedics;  Laterality: Right;   KNEE ARTHROSCOPY     bilateral/  12/14   REIMPLANTATION OF TOTAL KNEE Right 08/02/2020   Procedure: REIMPLANTATION/REVISION OF TOTAL KNEE WITH REMOVAL OF ANTIBIOTIC SPACER;  Surgeon: Paralee Cancel, MD;  [facility];  Service: Orthopedics;  Laterality: Right;  71mns   TEE WITHOUT CARDIOVERSION N/A 05/13/2018   Procedure: TRANSESOPHAGEAL ECHOCARDIOGRAM (TEE);  Surgeon: AElouise Munroe MD;  Location: [facility]  Service: Cardiovascular;  Laterality: N/A;   TOTAL KNEE ARTHROPLASTY Right 05/02/2013   Procedure: RIGHT TOTAL KNEE ARTHROPLASTY;  Surgeon: MMauri Pole MD;  [facility];  Service: Orthopedics;  Laterality: Right;    Family History  Problem Relation Age of Onset   Hypertension Father    Diabetes Father    Dementia Mother    Lupus Sister     Social History:  reports that he quit smoking about 6 years ago. His smoking use included cigarettes. He has a 5.00 pack-year smoking history. He has never used smokeless tobacco. He reports current alcohol use. He reports  current drug use. Drug: Marijuana.  Allergies: No Known Allergies  Medications: I have reviewed the patient's current medications.  Results for orders placed or performed during the hospital encounter of 02/26/22 (from the past 48 hour(s))  Acetaminophen level     Status: Abnormal   Collection Time: 02/26/22 11:20 AM  Result Value Ref Range   Acetaminophen (Tylenol), Serum <10 (L) 10 -  30 ug/mL    Comment: (NOTE) Therapeutic concentrations vary significantly. A range of 10-30 ug/mL  may be an effective concentration for many patients. However, some  are best treated at concentrations outside of this range. Acetaminophen concentrations >150 ug/mL at 4 hours after ingestion  and >50 ug/mL at 12 hours after ingestion are often associated with  toxic reactions.  Performed at [facility], [place] [address], [place], [place] [address]   Ethanol     Status: None   Collection Time: 02/26/22 11:20 AM  Result Value Ref Range   Alcohol, Ethyl (B) <10 <10 mg/dL    Comment: (NOTE) Lowest detectable limit for serum alcohol is 10 mg/dL.  For medical purposes only. Performed at [facility], [place] [address] [place], [place] [address]   Comprehensive metabolic panel     Status: Abnormal   Collection Time: 02/26/22 11:20 AM  Result Value Ref Range   Sodium 135 135 - 145 mmol/L   Potassium 5.4 (H) 3.5 - 5.1 mmol/L   Chloride 107 98 - 111 mmol/L   CO2 21 (L) 22 - 32 mmol/L   Glucose, Bld 102 (H) 70 - 99 mg/dL    Comment: Glucose reference range applies only to samples taken after fasting for at least 8 hours.   BUN 36 (H) 8 - 23 mg/dL   Creatinine, Ser 1.62 (H) 0.61 - 1.24 mg/dL   Calcium 8.7 (L) 8.9 - 10.3 mg/dL   Total Protein 7.9 6.5 - 8.1 g/dL   Albumin 2.9 (L) 3.5 - 5.0 g/dL   AST 26 15 - 41 U/L   ALT 12 0 - 44 U/L   Alkaline Phosphatase 107 38 - 126 U/L   Total Bilirubin 0.5 0.3 - 1.2 mg/dL   GFR, Estimated 46 (L) >60 mL/min    Comment: (NOTE) Calculated using the CKD-EPI Creatinine Equation (2021)    Anion gap 7 5 - 15    Comment: Performed at [facility], [place] [address], [place], [place] [address]  Lactic acid, plasma     Status: None   Collection Time: 02/26/22 11:20 AM  Result Value Ref Range   Lactic Acid, Venous 1.4 0.5 - 1.9 mmol/L    Comment: Performed at [facility], [place]  [address], [place], [place] [address]  Lipase, blood     Status: None   Collection Time: 02/26/22 11:20 AM  Result Value Ref Range   Lipase 28 11 - 51 U/L    Comment: Performed at [facility], [place] [address] [place], [place] [address]  Salicylate level     Status: Abnormal   Collection Time: 02/26/22 11:20 AM  Result Value Ref Range   Salicylate Lvl <2.9 (L) 7.0 - 30.0 mg/dL    Comment: Performed at [facility], [place] [address] [place], [place] [address]  Troponin I (High Sensitivity)     Status: None   Collection Time: 02/26/22 11:20 AM  Result Value Ref Range   Troponin I (High Sensitivity) 14 <18 ng/L    Comment: (NOTE) Elevated high  sensitivity troponin I (hsTnI) values and significant  changes across serial measurements may suggest ACS but many other  chronic and acute conditions are known to elevate hsTnI results.  Refer to the "Links" section for chest pain algorithms and additional  guidance. Performed at [facility], [place] [address], [place], [place] [address]   CBC with Differential     Status: Abnormal   Collection Time: 02/26/22 11:20 AM  Result Value Ref Range   WBC 4.4 4.0 - 10.5 K/uL   RBC 2.76 (L) 4.22 - 5.81 MIL/uL   Hemoglobin 8.7 (L) 13.0 - 17.0 g/dL   HCT 27.7 (L) 39.0 - 52.0 %   MCV 100.4 (H) 80.0 - 100.0 fL   MCH 31.5 26.0 - 34.0 pg   MCHC 31.4 30.0 - 36.0 g/dL   RDW 13.8 11.5 - 15.5 %   Platelets 227 150 - 400 K/uL   nRBC 0.0 0.0 - 0.2 %   Neutrophils Relative % 69 %   Neutro Abs 3.1 1.7 - 7.7 K/uL   Lymphocytes Relative 13 %   Lymphs Abs 0.6 (L) 0.7 - 4.0 K/uL   Monocytes Relative 8 %   Monocytes Absolute 0.4 0.1 - 1.0 K/uL   Eosinophils Relative 9 %   Eosinophils Absolute 0.4 0.0 - 0.5 K/uL   Basophils Relative 1 %   Basophils Absolute 0.0 0.0 - 0.1 K/uL   Immature Granulocytes 0 %   Abs Immature Granulocytes 0.01 0.00 - 0.07 K/uL    Comment: Performed at [facility],  [place] [address], [place], [place] [address]  Ammonia     Status: Abnormal   Collection Time: 02/26/22 11:20 AM  Result Value Ref Range   Ammonia 61 (H) 9 - 35 umol/L    Comment: Performed at [facility], [place] [address], [place], [place] [address]  Resp Panel by RT-PCR (Flu A&B, Covid) Anterior Nasal Swab     Status: None   Collection Time: 02/26/22 11:20 AM   Specimen: Anterior Nasal Swab  Result Value Ref Range   SARS Coronavirus 2 by RT PCR NEGATIVE NEGATIVE    Comment: (NOTE) SARS-CoV-2 target nucleic acids are NOT DETECTED.  The SARS-CoV-2 RNA is generally detectable in upper respiratory specimens during the acute phase of infection. The lowest concentration of SARS-CoV-2 viral copies this assay can detect is 138 copies/mL. A negative result does not preclude SARS-Cov-2 infection and should not be used as the sole basis for treatment or other patient management decisions. A negative result may occur with  improper specimen collection/handling, submission of specimen other than nasopharyngeal swab, presence of viral mutation(s) within the areas targeted by this assay, and inadequate number of viral copies(<138 copies/mL). A negative result must be combined with clinical observations, patient history, and epidemiological information. The expected result is Negative.  Fact Sheet for Patients:  EntrepreneurPulse.com.au  Fact Sheet for Healthcare Providers:  IncredibleEmployment.be  This test is no t yet approved or cleared by the [place] FDA and  has been authorized for detection and/or diagnosis of SARS-CoV-2 by FDA under an Emergency Use Authorization (EUA). This EUA will remain  in effect (meaning this test can be used) for the duration of the COVID-19 declaration under Section 564(b)(1) of the Act, 21 U.S.C.section 360bbb-3(b)(1), unless the authorization is terminated  or revoked sooner.       Influenza A by  PCR NEGATIVE NEGATIVE   Influenza B by PCR NEGATIVE NEGATIVE    Comment: (NOTE) The  Xpert Xpress SARS-CoV-2/FLU/RSV plus assay is intended as an aid in the diagnosis of influenza from Nasopharyngeal swab specimens and should not be used as a sole basis for treatment. Nasal washings and aspirates are unacceptable for Xpert Xpress SARS-CoV-2/FLU/RSV testing.  Fact Sheet for Patients: EntrepreneurPulse.com.au  Fact Sheet for Healthcare Providers: IncredibleEmployment.be  This test is not yet approved or cleared by the [place] FDA and has been authorized for detection and/or diagnosis of SARS-CoV-2 by FDA under an Emergency Use Authorization (EUA). This EUA will remain in effect (meaning this test can be used) for the duration of the COVID-19 declaration under Section 564(b)(1) of the Act, 21 U.S.C. section 360bbb-3(b)(1), unless the authorization is terminated or revoked.  Performed at [facility], [place] [address] [place], [place] [address]   Troponin I (High Sensitivity)     Status: None   Collection Time: 02/26/22  1:20 PM  Result Value Ref Range   Troponin I (High Sensitivity) 13 <18 ng/L    Comment: (NOTE) Elevated high sensitivity troponin I (hsTnI) values and significant  changes across serial measurements may suggest ACS but many other  chronic and acute conditions are known to elevate hsTnI results.  Refer to the "Links" section for chest pain algorithms and additional  guidance. Performed at [facility], [place] [address] [place], [place] [address]   Urinalysis, Routine w reflex microscopic Urine, Clean Catch     Status: None   Collection Time: 02/26/22  1:55 PM  Result Value Ref Range   Color, Urine YELLOW YELLOW   APPearance CLEAR CLEAR   Specific Gravity, Urine 1.005 1.005 - 1.030   pH 6.0 5.0 - 8.0   Glucose, UA NEGATIVE NEGATIVE mg/dL   Hgb urine dipstick NEGATIVE NEGATIVE    Bilirubin Urine NEGATIVE NEGATIVE   Ketones, ur NEGATIVE NEGATIVE mg/dL   Protein, ur NEGATIVE NEGATIVE mg/dL   Nitrite NEGATIVE NEGATIVE   Leukocytes,Ua NEGATIVE NEGATIVE    Comment: Performed at [facility], [place] [address] [place], [place] [address]  Urine rapid drug screen (hosp performed)     Status: Abnormal   Collection Time: 02/26/22  1:55 PM  Result Value Ref Range   Opiates POSITIVE (A) NONE DETECTED   Cocaine NONE DETECTED NONE DETECTED   Benzodiazepines POSITIVE (A) NONE DETECTED   Amphetamines NONE DETECTED NONE DETECTED   Tetrahydrocannabinol POSITIVE (A) NONE DETECTED   Barbiturates NONE DETECTED NONE DETECTED    Comment: (NOTE) DRUG SCREEN FOR MEDICAL PURPOSES ONLY.  IF CONFIRMATION IS NEEDED FOR ANY PURPOSE, NOTIFY LAB WITHIN 5 DAYS.  LOWEST DETECTABLE LIMITS FOR URINE DRUG SCREEN Drug Class                     Cutoff (ng/mL) Amphetamine and metabolites    1000 Barbiturate and metabolites    200 Benzodiazepine                 200 Opiates and metabolites        300 Cocaine and metabolites        300 THC                            50 Performed at [facility], [place] [address], [place], [name] [address]   CBC     Status: Abnormal   Collection Time: 02/26/22  3:02 PM  Result Value Ref Range   WBC 5.4 4.0 - 10.5  K/uL   RBC 2.40 (L) 4.22 - 5.81 MIL/uL   Hemoglobin 7.6 (L) 13.0 - 17.0 g/dL   HCT 24.1 (L) 39.0 - 52.0 %   MCV 100.4 (H) 80.0 - 100.0 fL   MCH 31.7 26.0 - 34.0 pg   MCHC 31.5 30.0 - 36.0 g/dL   RDW 13.6 11.5 - 15.5 %   Platelets 203 150 - 400 K/uL   nRBC 0.0 0.0 - 0.2 %    Comment: Performed at [facility], [place] [address] [place], [place] [address]  Creatinine, serum     Status: Abnormal   Collection Time: 02/26/22  3:02 PM  Result Value Ref Range   Creatinine, Ser 1.42 (H) 0.61 - 1.24 mg/dL   GFR, Estimated 54 (L) >60 mL/min    Comment: (NOTE) Calculated using the CKD-EPI Creatinine  Equation (2021) Performed at [facility], [place] [address] [place], [place] [address]   Lactic acid, plasma     Status: None   Collection Time: 02/26/22  3:02 PM  Result Value Ref Range   Lactic Acid, Venous 1.0 0.5 - 1.9 mmol/L    Comment: Performed at [facility], [place] [address] [place], [place] [address]  Lactic acid, plasma     Status: None   Collection Time: 02/26/22  5:36 PM  Result Value Ref Range   Lactic Acid, Venous 1.3 0.5 - 1.9 mmol/L    Comment: Performed at [facility], [place] [address], [place], [place] [address]    DG Knee 1-2 Views Right  Result Date: 02/26/2022 CLINICAL DATA:  [id number] Pain and swelling of right knee [id number] EXAM: RIGHT KNEE - 1-2 VIEW COMPARISON:  Radiograph 11/08/2020 FINDINGS: Long-stem total knee arthroplasty. Interval bone erosion/resorption adjacent to the medial tibial tray and along the lateral distal femoral metaphysis and lateral femoral condyle. There is periosteal reaction along the patella and medial femoral metaphysis/medial femoral condyle with bony thinning and sclerosis. There is soft tissue swelling along the knee and a moderate-sized joint effusion. IMPRESSION: Right total knee arthroplasty with findings of prosthetic joint infection. Extensive soft tissue swelling with moderate-sized joint effusion. Electronically Signed   By: Maurine Simmering M.D.   On: 02/26/2022 15:50   CT Cervical Spine Wo Contrast  Result Date: 02/26/2022 CLINICAL DATA:  Trauma, altered mental status EXAM: CT CERVICAL SPINE WITHOUT CONTRAST TECHNIQUE: Multidetector CT imaging of the cervical spine was performed without intravenous contrast. Multiplanar CT image reconstructions were also generated. RADIATION DOSE REDUCTION: This exam was performed according to the departmental dose-optimization program which includes automated exposure control, adjustment of the mA and/or kV according to patient size and/or use of  iterative reconstruction technique. COMPARISON:  None Available. FINDINGS: There is patient motion in the images limiting the study. Alignment: Alignment of posterior margins of vertebral bodies is unremarkable. Skull base and vertebrae: No recent fracture is seen. Evaluation for undisplaced fractures is limited due to motion artifacts. Degenerative changes are noted in cervical spine, more so at C6-C7 level. Soft tissues and spinal canal: There is no central spinal stenosis. Disc levels: There is encroachment of neural foramina at C5-C6 and C6-C7 levels, more so on the left side at C6-C7 level. Upper chest: Unremarkable. Other: Arterial calcifications are seen, more so at common carotid bifurcations on both sides. IMPRESSION: Motion limited study. As far as seen, no displaced fracture is seen. Cervical spondylosis with encroachment of neural foramina at C5-C6 and C6-C7 levels, more so on the left side at  C6-C7 level. Arteriosclerosis. Electronically Signed   By: Elmer Picker M.D.   On: 02/26/2022 12:58   CT Head Wo Contrast  Result Date: 02/26/2022 CLINICAL DATA:  Altered mental status EXAM: CT HEAD WITHOUT CONTRAST TECHNIQUE: Contiguous axial images were obtained from the base of the skull through the vertex without intravenous contrast. RADIATION DOSE REDUCTION: This exam was performed according to the departmental dose-optimization program which includes automated exposure control, adjustment of the mA and/or kV according to patient size and/or use of iterative reconstruction technique. COMPARISON:  12/22/2018 FINDINGS: Brain: No acute intracranial findings are seen. There are no signs of bleeding. Ventricles are not dilated. Cortical sulci are prominent. There is decreased density in periventricular white matter. Vascular: Scattered arterial calcifications are seen. Skull: Unremarkable. Sinuses/Orbits: Small air-fluid level is seen in left maxillary sinus. There is mucosal thickening in the ethmoid  sinus. Other: None. IMPRESSION: No acute intracranial findings are seen. Atrophy. Small-vessel disease. Chronic ethmoid sinusitis. Small air-fluid level in left maxillary sinus suggests possible mild acute sinusitis. Electronically Signed   By: Elmer Picker M.D.   On: 02/26/2022 12:54   DG Chest Portable 1 View  Result Date: 02/26/2022 CLINICAL DATA:  Altered mental status. EXAM: PORTABLE CHEST 1 VIEW COMPARISON:  Chest x-ray dated October 01, 2018. FINDINGS: The heart size and mediastinal contours are within normal limits. Both lungs are clear. The visualized skeletal structures are unremarkable. IMPRESSION: No active disease. Electronically Signed   By: Titus Dubin M.D.   On: 02/26/2022 12:02    ROS Blood pressure 126/73, pulse 87, temperature 97.8 F (36.6 C), temperature source Oral, resp. rate 11, height '5\' 6"'$  (1.676 m), weight 57 kg, SpO2 96 %. Physical Exam  Assessment/Plan: R knee pain and effusion, pt currently septic, unknown source of infx. Prior hx of TKA/post-op infx/I&D/IV abx/reimplantation and revision TKA by Dr. Alvan Dame  Seen by Dr. Eulis Manly, R knee aspirated  Mark [name] 02/26/2022, 7:03 PM

## 2022-02-26 NOTE — ED Triage Notes (Addendum)
BIBA from home. Per ems wife reports coming home last night with patient slow to respond and empty bottle of tequila beside couch at 2100. Pt arrives responsive to painful stimul, urine noted to clothes, right knee, foot, and ankle swelling with heat. Doppler pedal pulse noted to right foot.  hx of knee replacement, Hx of ETOH/opoid abuse

## 2022-02-26 NOTE — H&P (Signed)
History and Physical    Patient: Mark [name]. NWG:[id number] DOB: Sep 08, 1955 DOA: 02/26/2022 DOS: the patient was seen and examined on 02/26/2022 PCP: [name], MD  Patient coming from: Home  Chief Complaint:  Chief Complaint  Patient presents with   Altered Mental Status   HPI: Mark [name]. is a 66 y.o. male with medical history significant of ETOH abuse, cirrhosis, HLD, HTN, CVA presents with altered mental status. Pt is currently obtunded, unable to obtain from pt. Pt out trick or treating with grandchildren. Upon return, pt was drowsy, difficult to arouse. On AM of admit, pt was unable to arouse. Spouse believes pt may have done herion.Spouse reports ongoing etoh abuse. Spouse reports on-going R knee pain in post-op knee  In the ED, pt was found to be hypothermic with temp of 93.9, requiring bair hugger. Pt was given trial of narcan. Pt still encephalopathic. Pt started on broad spec abx. Hospitialist consulted   Review of Systems: unable to review all systems due to the inability of the patient to answer questions. Past Medical History:  Diagnosis Date   Alcohol abuse    Anemia    Arthritis    Ascites    Cirrhosis ([place])    Coffee ground emesis    Dehydration 06/17/2017   Febrile illness    GERD (gastroesophageal reflux disease)    Heart murmur    History of blood transfusion    Hyperlipidemia    Hypertension    Leg swelling    Myocardial infarction ([place]) 2012   Preop cardiovascular exam 04/14/2013   Sepsis ([place]) 06/17/2017   Septic shock ([place]) 06/18/2017   SIRS (systemic inflammatory response syndrome) ([place]) 07/11/2017   Stroke ([place]) 10/2009   TIA   Thrombocytopenia ([place])    Past Surgical History:  Procedure Laterality Date   COLONOSCOPY WITH PROPOFOL N/A 02/07/2016   Procedure: COLONOSCOPY WITH PROPOFOL;  Surgeon: Milus Banister, MD;  Location: [facility];  Service: Endoscopy;  Laterality: N/A;   ESOPHAGOGASTRODUODENOSCOPY (EGD) WITH  PROPOFOL N/A 02/07/2016   Procedure: ESOPHAGOGASTRODUODENOSCOPY (EGD) WITH PROPOFOL;  Surgeon: Milus Banister, MD;  [facility]: [facility];  Service: Endoscopy;  Laterality: N/A;   ESOPHAGOGASTRODUODENOSCOPY (EGD) WITH PROPOFOL N/A 06/22/2017   Procedure: ESOPHAGOGASTRODUODENOSCOPY (EGD) WITH PROPOFOL;  Surgeon: Jerene Bears, MD;  Location: [facility] ENDOSCOPY;  Service: Gastroenterology;  Laterality: N/A;   EXCISIONAL TOTAL KNEE ARTHROPLASTY WITH ANTIBIOTIC SPACERS Right 03/20/2020   Procedure: Resection right total knee arthroplasty and placement of antibiotic spacer;  Surgeon: Paralee Cancel, MD;  Location: [facility];  Service: Orthopedics;  Laterality: Right;  90 mins   HERNIA REPAIR Right    inguinal   INGUINAL HERNIA REPAIR Left 02/08/2021   Procedure: OPEN LEFT INGUINAL HERNIA REPAIR WITH MESH;  Surgeon: Coralie Keens, MD;  Location: [place];  Service: General;  Laterality: Left;   IRRIGATION AND DEBRIDEMENT KNEE Right 11/13/2020   Procedure: IRRIGATION AND DEBRIDEMENT KNEE;  Surgeon: Paralee Cancel, MD;  Location: [facility];  Service: Orthopedics;  Laterality: Right;   KNEE ARTHROSCOPY     bilateral/  12/14   REIMPLANTATION OF TOTAL KNEE Right 08/02/2020   Procedure: REIMPLANTATION/REVISION OF TOTAL KNEE WITH REMOVAL OF ANTIBIOTIC SPACER;  Surgeon: Paralee Cancel, MD;  Location: [facility];  Service: Orthopedics;  Laterality: Right;  40mns   TEE WITHOUT CARDIOVERSION N/A 05/13/2018   Procedure: TRANSESOPHAGEAL ECHOCARDIOGRAM (TEE);  Surgeon: AElouise Munroe MD;  [facility];  Service: Cardiovascular;  Laterality: N/A;   TOTAL KNEE  ARTHROPLASTY Right 05/02/2013   Procedure: RIGHT TOTAL KNEE ARTHROPLASTY;  Surgeon: Mauri Pole, MD;  Location: [facility];  Service: Orthopedics;  Laterality: Right;   Social History:  reports that he quit smoking about 6 years ago. His smoking use included cigarettes. He has a 5.00 pack-year smoking history. He has never used smokeless tobacco. He reports current  alcohol use. He reports current drug use. Drug: Marijuana.  No Known Allergies  Family History  Problem Relation Age of Onset   Hypertension Father    Diabetes Father    Dementia Mother    Lupus Sister     Prior to Admission medications   Medication Sig Start Date End Date Taking? Authorizing Provider  aspirin EC 81 MG tablet Take 81 mg by mouth in the morning and at bedtime. Swallow whole.    [provider]  docusate sodium (COLACE) 100 MG capsule Take 1 capsule (100 mg total) by mouth 2 (two) times daily. 08/15/20   [name], MD  doxycycline (VIBRA-TABS) 100 MG tablet TAKE 1 TABLET(100 MG) BY MOUTH EVERY 12 HOURS 11/25/21   Michel Bickers, MD  doxycycline (VIBRAMYCIN) 100 MG capsule Take 1 capsule (100 mg total) by mouth 2 (two) times daily. 09/05/21   Michel Bickers, MD  ferrous sulfate 325 (65 FE) MG tablet Take 1 tablet (325 mg total) by mouth 2 (two) times daily with a meal. Take for two weeks as tolerated. 08/15/20   [name], MD  folic acid (FOLVITE) 1 MG tablet TAKE 1 TABLET(1 MG) BY MOUTH DAILY Patient not taking: Reported on 09/05/2021 04/03/21   [name], MD  furosemide (LASIX) 20 MG tablet TAKE 1 TABLET(20 MG) BY MOUTH TWICE DAILY Patient not taking: Reported on 09/05/2021 05/06/21   [name], MD  methocarbamol (ROBAXIN) 500 MG tablet Take 1 tablet (500 mg total) by mouth every 6 (six) hours as needed for muscle spasms. Patient not taking: Reported on 01/30/2021 11/14/20   Irving Copas, PA-C  nadolol (CORGARD) 20 MG tablet TAKE 1 TABLET BY MOUTH EVERY DAY Patient not taking: Reported on 01/30/2021 11/08/20   [name], MD  oxyCODONE (OXY IR/ROXICODONE) 5 MG immediate release tablet Take 1 tablet (5 mg total) by mouth every 6 (six) hours as needed for severe pain or moderate pain. Patient not taking: Reported on 09/05/2021 02/08/21   Coralie Keens, MD  Oxycodone HCl 10 MG TABS Take 10 mg by mouth daily as needed for pain. Patient  not taking: Reported on 09/05/2021 01/12/21   [provider]  pantoprazole (PROTONIX) 40 MG tablet TAKE 1 TABLET BY MOUTH EVERY DAY 04/05/21   [name], MD  polyethylene glycol (MIRALAX / GLYCOLAX) 17 g packet Take 17 g by mouth daily. Patient not taking: Reported on 01/30/2021 11/15/20   Bonnielee Haff, MD    Physical Exam: Vitals:   02/26/22 1300 02/26/22 1330 02/26/22 1400 02/26/22 1430  BP: 123/64 117/61 (!) 116/57 (!) 110/57  Pulse: 80 86 85 83  Resp: '16 20 20 20  '$ Temp:   (!) 94.5 F (34.7 C)   TempSrc:   Rectal   SpO2: 94% 98% 100% 99%  Weight:      Height:       General exam: Asleep, arousable, laying in bed, in nad Respiratory system: Normal respiratory effort, no wheezing Cardiovascular system: regular rate, s1, s2 Gastrointestinal system: Soft, nondistended, positive BS Central nervous system: CN2-12 grossly intact, strength intact Extremities: Perfused, no clubbing Skin:  Normal skin turgor, no notable skin lesions seen Psychiatry: unable to assess given mentation  Data Reviewed:  Labs reviewed: Na 135, K 5.4 , Cr 1.52  Assessment and Plan: No notes have been filed under this hospital service. Service: Hospitalist Sepsis with unclear source of infection -Presenting with enephalopathy, hypothermia -CXR clear, UA unremarkable -R knee edematous. Will check xray of R knee -Blood cx pending -Will continue on empiric broad spec abx for now  Toxic metabolic encephalopathy -Unclear etiology -Ammonia mildly elevated -Clinically septic per above -Cont broad spec abx -Will give trial of lactulose -trial of narcan given in ED  R knee swelling -Per above, will f/u on knee xray -Per pt's spouse, pt has been complaining of on-going R knee pain since most recent knee surgery  ETOH cirrhosis, ETOH abuse -Will cont on CIWA protocol -will need cessation -Abd seems mildly distended. Will request diagnostic US paracentesis  HTN -BP stable at  present  Hypothermia -Will continue on bair hugger  -cont treatment per above    Advance Care Planning:   Code Status: DNR DNR, discussed with pt's spouse on phone  Consults:   Family Communication: [name] wife over phone  Severity of Illness: The appropriate patient status for this patient is INPATIENT. Inpatient status is judged to be reasonable and necessary in order to provide the required intensity of service to ensure the patient's safety. The patient's presenting symptoms, physical exam findings, and initial radiographic and laboratory data in the context of their chronic comorbidities is felt to place them at high risk for further clinical deterioration. Furthermore, it is not anticipated that the patient will be medically stable for discharge from the hospital within 2 midnights of admission.   * I certify that at the point of admission it is my clinical judgment that the patient will require inpatient hospital care spanning beyond 2 midnights from the point of admission due to high intensity of service, high risk for further deterioration and high frequency of surveillance required.*  Author: Marylu Lund, MD 02/26/2022 3:00 PM  For on call review [email or web address].

## 2022-02-26 NOTE — Progress Notes (Signed)
Pharmacy Antibiotic Note  Mark [name]. is a 66 y.o. male admitted on 02/26/2022 with sepsis with unclear source of infection.  Pharmacy has been consulted for cefepime and vancomycin dosing.  In ED, cefepime 2 g and vancomycin 1000 mg IV administered.  Provider has also ordered metronidazole 500 mg IV every 12 hours.  Plan: Continue cefepime 2 g IV every 12 hours Continue vancomycin at 750 mg IV every 24 hours (Goal AUC 400-550, eAUC 492.7, SCr used: 1.62) Monitor clinical progress, renal function, vancomycin levels as indicated F/U C&S, abx deescalation / LOT   Height: '5\' 6"'$  (167.6 cm) Weight: 57 kg (125 lb 10.6 oz) IBW/kg (Calculated) : 63.8  Temp (24hrs), Avg:95.4 F (35.2 C), Min:93.9 F (34.4 C), Max:97.9 F (36.6 C)  Recent Labs  Lab 02/26/22 1120  WBC 4.4  CREATININE 1.62*  LATICACIDVEN 1.4    Estimated Creatinine Clearance: 35.2 mL/min (A) (by C-G formula based on SCr of 1.62 mg/dL (H)).    No Known Allergies  Microbiology results: 11/1 BCx: sent 11/1 UCx: sent  11/1 MRSA PCR: ordered  Thank you for allowing pharmacy to be a part of this patient's care.  Suzzanne Cloud, PharmD, BCPS 02/26/2022 3:39 PM

## 2022-02-26 NOTE — ED Provider Notes (Addendum)
Emergency Department Provider Note   I have reviewed the triage vital signs and the nursing notes.   HISTORY  Chief Complaint Altered Mental Status   HPI Mark [name]. is a 66 y.o. male with past history reviewed below including alcohol/opiate abuse, cirrhosis, prior episodes of septic arthritis and bacteremia, and upper GI bleed presents to the emergency department with altered mental status.  EMS report they were called to the scene after patient had been in the state for many hours.  I spoke with the patient's wife by phone who confirmed that he was last normal at around 6 to 7 PM yesterday.  She went out to trigger treating with the grandkids and he stayed home.  When she got home she found him to be somnolent.  She states that she stayed up with them most of the night but could not wake him and so at 8 AM this morning called family and EMS.  He was found with empty bottle of tequila next to him and continues to actively drink but his wife states that that is been there for some time.  She is not sure if he drink anything else but he typically drinks beer.  She has concerned that his ammonia is elevated again.  He does not take lactulose regularly but has been prescribed that in the past.  She states that he is compliant with his other medications.  We did discuss CODE STATUS and she confirms that he would not want to be on a ventilator or have chest compressions if he went into cardiac arrest.  He would be okay with antibiotics, fluids, and other supportive care measures.  Code Status: DNR   Past Medical History:  Diagnosis Date   Alcohol abuse    Anemia    Arthritis    Ascites    Cirrhosis ([place])    Coffee ground emesis    Dehydration 06/17/2017   Febrile illness    GERD (gastroesophageal reflux disease)    Heart murmur    History of blood transfusion    Hyperlipidemia    Hypertension    Leg swelling    Myocardial infarction ([place]) 2012   Preop cardiovascular exam  04/14/2013   Sepsis ([place]) 06/17/2017   Septic shock ([place]) 06/18/2017   SIRS (systemic inflammatory response syndrome) ([place]) 07/11/2017   Stroke ([place]) 10/2009   TIA   Thrombocytopenia ([place])     Review of Systems  Level 5 caveat: AMS  ____________________________________________   PHYSICAL EXAM:  VITAL SIGNS: ED Triage Vitals  Enc Vitals Group     BP 02/26/22 1045 (!) 119/96     Pulse Rate 02/26/22 1045 67     Resp 02/26/22 1045 10     Temp 02/26/22 1045 (!) 93.9 F (34.4 C)     Temp Source 02/26/22 1045 Rectal     SpO2 02/26/22 1045 100 %     Weight 02/26/22 1057 125 lb 10.6 oz (57 kg)     Height 02/26/22 1057 '5\' 6"'$  (1.676 m)   Constitutional: Somnolent. Grimacing and localizing to pain.  Eyes: Conjunctivae are normal. Pupils are 3 mm bilaterally.  Head: Atraumatic. Nose: No congestion/rhinnorhea. Mouth/Throat: Mucous membranes are dry.    Neck: No stridor.  Cardiovascular: Normal rate, regular rhythm. Able to doppler right DP pulse (and marked with X). Normal capillary refill bilaterally. Grossly normal heart sounds.   Respiratory: Normal respiratory effort.  No retractions. Lungs CTAB. Gastrointestinal: Soft and nontender. Mild distention.  Musculoskeletal: Right  knee swelling without erythema or warmth on my exam.  Neurologic: Somnolent. Arouses to pain. GCS 8 (E2V1M5).  Skin:  Skin is warm, dry and intact. No rash noted.   ____________________________________________   LABS (all labs ordered are listed, but only abnormal results are displayed)  Labs Reviewed  ACETAMINOPHEN LEVEL - Abnormal; Notable for the following components:      Result Value   Acetaminophen (Tylenol), Serum <10 (*)    All other components within normal limits  COMPREHENSIVE METABOLIC PANEL - Abnormal; Notable for the following components:   Potassium 5.4 (*)    CO2 21 (*)    Glucose, Bld 102 (*)    BUN 36 (*)    Creatinine, Ser 1.62 (*)    Calcium 8.7 (*)    Albumin 2.9 (*)     GFR, Estimated 46 (*)    All other components within normal limits  SALICYLATE LEVEL - Abnormal; Notable for the following components:   Salicylate Lvl <3.6 (*)    All other components within normal limits  CBC WITH DIFFERENTIAL/PLATELET - Abnormal; Notable for the following components:   RBC 2.76 (*)    Hemoglobin 8.7 (*)    HCT 27.7 (*)    MCV 100.4 (*)    Lymphs Abs 0.6 (*)    All other components within normal limits  AMMONIA - Abnormal; Notable for the following components:   Ammonia 61 (*)    All other components within normal limits  RAPID URINE DRUG SCREEN, HOSP PERFORMED - Abnormal; Notable for the following components:   Opiates POSITIVE (*)    Benzodiazepines POSITIVE (*)    Tetrahydrocannabinol POSITIVE (*)    All other components within normal limits  RESP PANEL BY RT-PCR (FLU A&B, COVID) ARPGX2  URINE CULTURE  CULTURE, BLOOD (ROUTINE X 2)  CULTURE, BLOOD (ROUTINE X 2)  ETHANOL  LACTIC ACID, PLASMA  LIPASE, BLOOD  URINALYSIS, ROUTINE W REFLEX MICROSCOPIC  TROPONIN I (HIGH SENSITIVITY)  TROPONIN I (HIGH SENSITIVITY)   ____________________________________________  EKG   EKG Interpretation  Date/Time:  Wednesday February 26 2022 10:42:11 EDT Ventricular Rate:  71 PR Interval:  205 QRS Duration: 83 QT Interval:  444 QTC Calculation: 483 R Axis:   59 Text Interpretation: Sinus rhythm Anterior infarct, old Borderline ST elevation, lateral leads Peaked T waves noted Confirmed by Nanda Quinton [phone]) on 02/26/2022 11:51:24 AM        ____________________________________________  RADIOLOGY  CT Cervical Spine Wo Contrast  Result Date: 02/26/2022 CLINICAL DATA:  Trauma, altered mental status EXAM: CT CERVICAL SPINE WITHOUT CONTRAST TECHNIQUE: Multidetector CT imaging of the cervical spine was performed without intravenous contrast. Multiplanar CT image reconstructions were also generated. RADIATION DOSE REDUCTION: This exam was performed according to the  departmental dose-optimization program which includes automated exposure control, adjustment of the mA and/or kV according to patient size and/or use of iterative reconstruction technique. COMPARISON:  None Available. FINDINGS: There is patient motion in the images limiting the study. Alignment: Alignment of posterior margins of vertebral bodies is unremarkable. Skull base and vertebrae: No recent fracture is seen. Evaluation for undisplaced fractures is limited due to motion artifacts. Degenerative changes are noted in cervical spine, more so at C6-C7 level. Soft tissues and spinal canal: There is no central spinal stenosis. Disc levels: There is encroachment of neural foramina at C5-C6 and C6-C7 levels, more so on the left side at C6-C7 level. Upper chest: Unremarkable. Other: Arterial calcifications are seen, more so at common carotid bifurcations on both  sides. IMPRESSION: Motion limited study. As far as seen, no displaced fracture is seen. Cervical spondylosis with encroachment of neural foramina at C5-C6 and C6-C7 levels, more so on the left side at C6-C7 level. Arteriosclerosis. Electronically Signed   By: Elmer Picker M.D.   On: 02/26/2022 12:58   CT Head Wo Contrast  Result Date: 02/26/2022 CLINICAL DATA:  Altered mental status EXAM: CT HEAD WITHOUT CONTRAST TECHNIQUE: Contiguous axial images were obtained from the base of the skull through the vertex without intravenous contrast. RADIATION DOSE REDUCTION: This exam was performed according to the departmental dose-optimization program which includes automated exposure control, adjustment of the mA and/or kV according to patient size and/or use of iterative reconstruction technique. COMPARISON:  12/22/2018 FINDINGS: Brain: No acute intracranial findings are seen. There are no signs of bleeding. Ventricles are not dilated. Cortical sulci are prominent. There is decreased density in periventricular white matter. Vascular: Scattered arterial  calcifications are seen. Skull: Unremarkable. Sinuses/Orbits: Small air-fluid level is seen in left maxillary sinus. There is mucosal thickening in the ethmoid sinus. Other: None. IMPRESSION: No acute intracranial findings are seen. Atrophy. Small-vessel disease. Chronic ethmoid sinusitis. Small air-fluid level in left maxillary sinus suggests possible mild acute sinusitis. Electronically Signed   By: Elmer Picker M.D.   On: 02/26/2022 12:54   DG Chest Portable 1 View  Result Date: 02/26/2022 CLINICAL DATA:  Altered mental status. EXAM: PORTABLE CHEST 1 VIEW COMPARISON:  Chest x-ray dated October 01, 2018. FINDINGS: The heart size and mediastinal contours are within normal limits. Both lungs are clear. The visualized skeletal structures are unremarkable. IMPRESSION: No active disease. Electronically Signed   By: Titus Dubin M.D.   On: 02/26/2022 12:02    ____________________________________________   PROCEDURES  Procedure(s) performed:   .Critical Care  Performed by: Margette Fast, MD Authorized by: Margette Fast, MD   Critical care provider statement:    Critical care time (minutes):  45   Critical care time was exclusive of:  Separately billable procedures and treating other patients and teaching time   Critical care was necessary to treat or prevent imminent or life-threatening deterioration of the following conditions:  CNS failure or compromise   Critical care was time spent personally by me on the following activities:  Development of treatment plan with patient or surrogate, discussions with consultants, evaluation of patient's response to treatment, examination of patient, ordering and review of laboratory studies, ordering and review of radiographic studies, ordering and performing treatments and interventions, pulse oximetry, re-evaluation of patient's condition, review of old charts, blood draw for specimens and obtaining history from patient or surrogate   I assumed  direction of critical care for this patient from another provider in my specialty: no     Care discussed with: admitting provider      ____________________________________________   INITIAL IMPRESSION / [place] / ED COURSE  Pertinent labs & imaging results that were available during my care of the patient were reviewed by me and considered in my medical decision making (see chart for details).   This patient is Presenting for Evaluation of AMS, which does require a range of treatment options, and is a complaint that involves a high risk of morbidity and mortality.  The Differential Diagnoses includes but is not exclusive to alcohol, illicit or prescription medications, intracranial pathology such as stroke, intracerebral hemorrhage, fever or infectious causes including sepsis, hypoxemia, uremia, trauma, endocrine related disorders such as diabetes, hypoglycemia, thyroid-related diseases, etc.  Critical Interventions-    Medications  lactated ringers infusion ( Intravenous New Bag/Given 02/26/22 1422)  sodium chloride 0.9 % bolus 1,000 mL (1,000 mLs Intravenous New Bag/Given 02/26/22 1159)  naloxone (NARCAN) injection 0.4 mg (0.4 mg Intravenous Given 02/26/22 1159)  ceFEPIme (MAXIPIME) 2 g in sodium chloride 0.9 % 100 mL IVPB (0 g Intravenous Stopped 02/26/22 1230)  metroNIDAZOLE (FLAGYL) IVPB 500 mg (0 mg Intravenous Stopped 02/26/22 1318)  vancomycin (VANCOCIN) IVPB 1000 mg/200 mL premix (1,000 mg Intravenous New Bag/Given 02/26/22 1319)  lactulose (CHRONULAC) 10 GM/15ML solution 10 g (10 g Oral Given 02/26/22 1318)    Reassessment after intervention: Mental status improving slightly.    I did obtain Additional Historical Information from EMS and wife by phone, as the patient is somnolent.  I decided to review pertinent External Data, and in summary patient with prior history of bacteremia, sepsis, septic joint, and EtOH/drug abuse with liver cirrhosis. Not taking lactulose  currently.    Clinical Laboratory Tests Ordered, included no leukocytosis.  Normal lactic acid.  COVID and flu negative.  No UTI.  Troponin normal.  LFTs and bilirubin within normal limits.  Mild acute kidney injury with elevated creatinine and BUN.  Ona also slightly elevated at 61.   Radiologic Tests Ordered, included CT head, c spine, and CXR. I independently interpreted the images and agree with radiology interpretation.   Cardiac Monitor Tracing which shows NSR.    Social Determinants of Health Risk positive EtOH and drug use.   Consult complete with Hospitalist. Plan for admit.   Medical Decision Making: Summary:  Patient arrives to the emergency department with altered mental status.  Last known normal around 7 PM yesterday.  He is more globally encephalopathic on my exam but difficult to appreciate unilateral deficit given his somnolence.  Has been listed DNR in the past and discussed this with his wife by phone who confirms that this is his wish to remain DNR.  With hypothermia, will initiate antibiotics and IV fluids.  Will obtain CT imaging of the head and multiple labs including ammonia and UDS along with EtOH level. Right knee is swollen. Patient with history of chronic infection in this joint and is on suppressive Doxycycline. Wife reports compliance with this. Will initiate broad spectrum abx.   Reevaluation with update and discussion with patient.  Reassessment he is more alert.  He opens eyes to pain and sternal rub.  He moves his hand to mine to pull it off and tells me to "quit" which is an improvement.   Disposition: admit  ____________________________________________  FINAL CLINICAL IMPRESSION(S) / ED DIAGNOSES  Final diagnoses:  Somnolence  Hypothermia, initial encounter  AKI (acute kidney injury) ([place])  Polysubstance abuse ([employer])    Note:  This document was prepared using Dragon voice recognition software and may include unintentional dictation errors.  Nanda Quinton, MD, [facility]    Mikah Rottinghaus, [employer], MD 02/26/22 1439    Margette Fast, MD 02/26/22 [phone]

## 2022-02-26 NOTE — Progress Notes (Signed)
A consult was received from an ED physician for vancomycin and cefepime per pharmacy dosing.  The patient's profile has been reviewed for ht/wt/allergies/indication/available labs.   A one time order has been placed for vancomycin 1g and cefepime 2g.  Further antibiotics/pharmacy consults should be ordered by admitting physician if indicated.                       Thank you, Peggyann Juba, PharmD, BCPS 02/26/2022  11:26 AM

## 2022-02-27 ENCOUNTER — Inpatient Hospital Stay (HOSPITAL_COMMUNITY): Payer: Medicare HMO

## 2022-02-27 ENCOUNTER — Encounter (HOSPITAL_COMMUNITY): Payer: Self-pay | Admitting: Internal Medicine

## 2022-02-27 DIAGNOSIS — G928 Other toxic encephalopathy: Secondary | ICD-10-CM | POA: Diagnosis not present

## 2022-02-27 LAB — COMPREHENSIVE METABOLIC PANEL
ALT: 12 U/L (ref 0–44)
AST: 29 U/L (ref 15–41)
Albumin: 2.3 g/dL — ABNORMAL LOW (ref 3.5–5.0)
Alkaline Phosphatase: 84 U/L (ref 38–126)
Anion gap: 6 (ref 5–15)
BUN: 32 mg/dL — ABNORMAL HIGH (ref 8–23)
CO2: 20 mmol/L — ABNORMAL LOW (ref 22–32)
Calcium: 8.2 mg/dL — ABNORMAL LOW (ref 8.9–10.3)
Chloride: 113 mmol/L — ABNORMAL HIGH (ref 98–111)
Creatinine, Ser: 1.38 mg/dL — ABNORMAL HIGH (ref 0.61–1.24)
GFR, Estimated: 56 mL/min — ABNORMAL LOW (ref >60)
Glucose, Bld: 85 mg/dL (ref 70–99)
Potassium: 4.6 mmol/L (ref 3.5–5.1)
Sodium: 139 mmol/L (ref 135–145)
Total Bilirubin: 0.8 mg/dL (ref 0.3–1.2)
Total Protein: 6.5 g/dL (ref 6.5–8.1)

## 2022-02-27 LAB — CBC
HCT: 22.9 % — ABNORMAL LOW (ref 39.0–52.0)
Hemoglobin: 7.3 g/dL — ABNORMAL LOW (ref 13.0–17.0)
MCH: 31.3 pg (ref 26.0–34.0)
MCHC: 31.9 g/dL (ref 30.0–36.0)
MCV: 98.3 fL (ref 80.0–100.0)
Platelets: 208 10*3/uL (ref 150–400)
RBC: 2.33 MIL/uL — ABNORMAL LOW (ref 4.22–5.81)
RDW: 14.1 % (ref 11.5–15.5)
WBC: 5.3 10*3/uL (ref 4.0–10.5)
nRBC: 0 % (ref 0.0–0.2)

## 2022-02-27 LAB — URINE CULTURE: Culture: NO GROWTH

## 2022-02-27 LAB — AMMONIA: Ammonia: 62 umol/L — ABNORMAL HIGH (ref 9–35)

## 2022-02-27 MED ORDER — LIDOCAINE HCL 1 % IJ SOLN
INTRAMUSCULAR | Status: AC
  Filled 2022-02-27: qty 20

## 2022-02-27 MED ORDER — CHLORHEXIDINE GLUCONATE CLOTH 2 % EX PADS
6.0000 | MEDICATED_PAD | Freq: Every day | CUTANEOUS | Status: DC
  Administered 2022-02-27 – 2022-03-01 (×3): 6 via TOPICAL

## 2022-02-27 MED ORDER — ORAL CARE MOUTH RINSE
15.0000 mL | OROMUCOSAL | Status: DC
  Administered 2022-02-27 – 2022-03-03 (×14): 15 mL via OROMUCOSAL

## 2022-02-27 MED ORDER — ORAL CARE MOUTH RINSE: 15.0000 mL | OROMUCOSAL | Status: DC | PRN

## 2022-02-27 NOTE — TOC Initial Note (Signed)
Transition of Care (TOC) - Initial/Assessment Note    Patient Details  Name: Mark [name]. MRN: [id number] Date of Birth: 20-Jan-1956  Transition of Care [facility]) CM/SW Contact:    Leeroy Cha, RN Phone Number: 02/27/2022, 7:46 AM  Clinical Narrative:                 Patient is from home.  Hx of smoking and quitting 6 years ago, current etoh use and current marijuana use.  Information for substance abuse added to the dc instructions.  Expected Discharge Plan: Home/Self Care Barriers to Discharge: Continued Medical Work up   Patient Goals and CMS Choice Patient states their goals for this hospitalization and ongoing recovery are:: to go home CMS Medicare.gov Compare Post Acute Care list provided to:: Patient Choice offered to / list presented to : Patient  Expected Discharge Plan and Services Expected Discharge Plan: Home/Self Care   Discharge Planning Services: CM Consult   Living arrangements for the past 2 months: Single Family Home                                      Prior Living Arrangements/Services Living arrangements for the past 2 months: Single Family Home Lives with:: Spouse Patient language and need for interpreter reviewed:: Yes Do you feel safe going back to the place where you live?: Yes            Criminal Activity/Legal Involvement Pertinent to Current Situation/Hospitalization: No - Comment as needed  Activities of Daily Living      Permission Sought/Granted                  Emotional Assessment Appearance:: Appears stated age Attitude/Demeanor/Rapport: Engaged Affect (typically observed): Calm Orientation: : Oriented to Self, Oriented to Place, Oriented to  Time, Oriented to Situation Alcohol / Substance Use: Tobacco Use, Alcohol Use, Illicit Drugs (quit smoking 6 years ago, etoh current use, marijuana-current use.) Psych Involvement: No (comment)  Admission diagnosis:  Somnolence [R40.0] Polysubstance abuse ([place])  [F19.10] AKI (acute kidney injury) ([place]) [J57.0] Toxic metabolic encephalopathy [V77.9] Hypothermia, initial encounter [T68.XXXA] Patient Active Problem List   Diagnosis Date Noted   Toxic metabolic encephalopathy 39/06/90   Septic joint of right knee joint ([place]) 11/08/2020   S/P right TKA reimplantation 08/02/2020   Infection of total left knee replacement ([place]) 03/20/2020   Cirrhosis of liver without ascites ([place])    AKI (acute kidney injury) ([place])    Bacteremia    Spinal stenosis of lumbar region without neurogenic claudication    Aspiration pneumonia ([place]) 09/24/2018   Abscess in epidural space of lumbar spine    MRSA bacteremia 09/21/2018   Infection of prosthetic right knee joint ([place]) 09/20/2018   Anemia of chronic disease 09/20/2018   Hypoalbuminemia 09/20/2018   Hypoglycemia without diagnosis of diabetes mellitus 09/20/2018   Epidural abscess 09/20/2018   Hyperkalemia 05/18/2018   Edentulous 05/11/2018   Pancytopenia ([place]) 05/10/2018   CAD (coronary artery disease) 05/10/2018   Hepatic encephalopathy ([place]) 05/10/2018   Alcohol abuse 05/10/2018   GERD (gastroesophageal reflux disease) 05/10/2018   Duodenal ulcer    Renal failure    Acute on chronic anemia    Hypotension 06/17/2017   Hyponatremia 06/17/2017   Leg edema, right 33/00/7622   Acute metabolic encephalopathy 63/33/5456   Anemia, iron deficiency    Benign neoplasm of ascending colon    Hemorrhoids  Portal hypertensive gastropathy ([name])    Gastritis and gastroduodenitis    Esophageal varices without bleeding ([name])    H/O: CVA (cerebrovascular accident) 12/01/2013   ACS (acute coronary syndrome) ([place]) 12/01/2013   Anasarca 12/01/2013   Polysubstance abuse ([place]) 12/01/2013   CKD (chronic kidney disease) stage 3, GFR 30-59 ml/min ([place]) 12/01/2013   S/P right TKA 05/02/2013   Murmur 04/14/2013   Right inguinal hernia 12/26/2010   Cirrhosis, alcoholic ([place]) 62/83/1517   PCP:  [name],  MD Pharmacy:   [facility] [place], [address] [place] [address] [address] Phone: [phone] Fax: [phone]     Social Determinants of Health (SDOH) Interventions    Readmission Risk Interventions   Row Labels 08/03/2020   11:48 AM  Readmission Risk Prevention Plan   Section Header. No data exists in this row.   Transportation Screening   Complete  HRI or Home Care Consult   Complete  Social Work Consult for [place] Planning/Counseling   Complete  Palliative Care Screening   Not Applicable  Medication Review Press photographer)   Complete

## 2022-02-27 NOTE — Progress Notes (Signed)
Subjective:     Patient seen in rounds for Dr. Alvan Dame.   Patient seen and evaluated in the ICU. He is not responsive to his name or light touch. I did not further agitate him.  Objective: Vital signs in last 24 hours: Temp:  [93.9 F (34.4 C)-98.6 F (37 C)] 98.6 F (37 C) (11/02 0400) Pulse Rate:  [64-103] 101 (11/02 0700) Resp:  [9-20] 13 (11/02 0700) BP: (93-146)/(49-96) 117/49 (11/02 0700) SpO2:  [93 %-100 %] 94 % (11/02 0700) Weight:  [57 kg] 57 kg (11/01 1057)  Intake/Output from previous day:  Intake/Output Summary (Last 24 hours) at 02/27/2022 0738 Last data filed at 02/27/2022 0700 Gross per 24 hour  Intake 2979.78 ml  Output 1800 ml  Net 1179.78 ml     Intake/Output this shift: No intake/output data recorded.  Labs: Recent Labs    02/26/22 1120 02/26/22 1502 02/27/22 0304  HGB 8.7* 7.6* 7.3*   Recent Labs    02/26/22 1502 02/27/22 0304  WBC 5.4 5.3  RBC 2.40* 2.33*  HCT 24.1* 22.9*  PLT 203 208   Recent Labs    02/26/22 1120 02/26/22 1502 02/27/22 0304  NA 135  --  139  K 5.4*  --  4.6  CL 107  --  113*  CO2 21*  --  20*  BUN 36*  --  32*  CREATININE 1.62* 1.42* 1.38*  GLUCOSE 102*  --  85  CALCIUM 8.7*  --  8.2*   No results for input(s): "LABPT", "INR" in the last 72 hours.  Exam: General - Patient is sleeping, not arousable to name Extremity -   Right Knee: Well healed arthroplasty scar. Mild warmth, no effusion. Bandage from aspiration. The appearance of his knee is not much different than it has been during the entire period after his 2 stage revision.   Motor Function - not able to assess, does not follow commands  Past Medical History:  Diagnosis Date   Alcohol abuse    Anemia    Arthritis    Ascites    Cirrhosis ([name])    Coffee ground emesis    Dehydration 06/17/2017   Febrile illness    GERD (gastroesophageal reflux disease)    Heart murmur    History of blood transfusion    Hyperlipidemia    Hypertension     Leg swelling    Myocardial infarction ([place]) 2012   Preop cardiovascular exam 04/14/2013   Sepsis ([place]) 06/17/2017   Septic shock ([place]) 06/18/2017   SIRS (systemic inflammatory response syndrome) ([place]) 07/11/2017   Stroke ([place]) 10/2009   TIA   Thrombocytopenia ([name])     Assessment/Plan:    Principal Problem:   Toxic metabolic encephalopathy  Estimated body mass index is 20.28 kg/m as calculated from the following:   Height as of this encounter: '5\' 6"'$  (1.676 m).   Weight as of this encounter: 57 kg.   Assessment: Encephalopathy patient obtunded with history of EtOH abuse, possible substance abuse  Radiographic evidence to suggest loosening of the femoral component History of right knee prosthetic joint infection, status post 2 stage revision  Plan:  Patient is well known to me. He is not able to provide history or exam feedback in his current state.  To me, this looks like his baseline in terms of the knee. He has had continued post-operative swelling after his reimplantation. He is obviously very high risk for recurrent infection in the knee due to medical comorbidities, but at last  office visit had been compliant with suppressive antibiotics.   Someone has aspirated his knee and we will await final cultures. Currently no organisms seen.   Resection of his knee would be an extremely high risk procedure, and at this time I would not recommend any surgical intervention.  Will discuss with Dr. Alvan Dame.    Griffith Citron, PA-C Orthopedic Surgery [phone] 02/27/2022, 7:38 AM

## 2022-02-27 NOTE — Progress Notes (Signed)
  Progress Note   Patient: Mark [name]. ZHG:[id number] DOB: Sep 21, 1955 DOA: 02/26/2022     1 DOS: the patient was seen and examined on 02/27/2022   Brief hospital course: 66 y.o. male with medical history significant of ETOH abuse, cirrhosis, HLD, HTN, CVA presents with altered mental status. Pt is currently obtunded, unable to obtain from pt. Pt out trick or treating with grandchildren. Upon return, pt was drowsy, difficult to arouse. On AM of admit, pt was unable to arouse. Spouse believes pt may have done herion.Spouse reports ongoing etoh abuse. Spouse reports on-going R knee pain in post-op knee   In the ED, pt was found to be hypothermic with temp of 93.9, requiring bair hugger. Pt was given trial of narcan. Pt still encephalopathic. Pt started on broad spec abx. Hospitialist consulted  Assessment and Plan: Sepsis secondary to R knee infection present on admission -Presenting with enephalopathy, hypothermia -CXR clear, UA unremarkable -R knee edematous. R knee xray reviewed, findings worrisome for joint infection in post-op knee -Appreciate input by Orthopedic Surgery. Recs to continue broad spec abx. Pt s/p arthrocentesis with finding of over 11k WBC's. Fluid cx pending thus far -Blood cx neg thus far -Will continue on empiric broad spec abx for now   Toxic metabolic encephalopathy -Unclear etiology -Ammonia mildly elevated -Clinically septic per above -Cont broad spec abx -No results with recent lactulose enema, will repeat -trial of narcan given in ED   R knee swelling -Per above, will f/u on knee xray -Per pt's spouse, pt has been complaining of on-going R knee pain since most recent knee surgery ->11k WBC's in fluid, pending culture result -Cont broad spec abx   ETOH cirrhosis, ETOH abuse -Will cont on CIWA protocol -will need cessation -Abd seems mildly distended. Requested diagnostic US paracentesis   HTN -BP stable at present   Hypothermia -resolved, did  require bair hugger initially -cont treatment per above      Subjective: Very drowsy this AM, difficult to assess  Physical Exam: Vitals:   02/27/22 1245 02/27/22 1300 02/27/22 1400 02/27/22 1500  BP:  (!) 169/73 (!) 164/71 (!) 166/70  Pulse: 90 89 87 86  Resp: '14 14 13 12  '$ Temp:      TempSrc:      SpO2: 98% 97% 95% 95%  Weight:      Height:       General exam: Asleep, laying in bed, in nad Respiratory system: Normal respiratory effort, no wheezing Cardiovascular system: regular rate, s1, s2 Gastrointestinal system: Soft, nondistended, positive BS Central nervous system: CN2-12 grossly intact, strength intact Extremities: Perfused, no clubbing Skin: Normal skin turgor, no notable skin lesions seen Psychiatry: Unable to assess given mentation  Data Reviewed:  Labs reviewed: Na 139, K 4.6, Cr 1.38, WBC 5.3, Hgb 7.3   Family Communication: Pt in room, family not at bedside  Disposition: Status is: Inpatient Remains inpatient appropriate because: Severity of illness  Planned Discharge Destination: Home    Author: Marylu Lund, MD 02/27/2022 3:43 PM  For on call review [email or web address].

## 2022-02-27 NOTE — Hospital Course (Signed)
66 y.o. male with medical history significant of ETOH abuse, cirrhosis, HLD, HTN, CVA presents with altered mental status. Pt is currently obtunded, unable to obtain from pt. Pt out trick or treating with grandchildren. Upon return, pt was drowsy, difficult to arouse. On AM of admit, pt was unable to arouse. Spouse believes pt may have done herion.Spouse reports ongoing etoh abuse. Spouse reports on-going R knee pain in post-op knee   In the ED, pt was found to be hypothermic with temp of 93.9, requiring bair hugger. Pt was given trial of narcan. Pt still encephalopathic. Pt started on broad spec abx. Hospitialist consulted

## 2022-02-28 DIAGNOSIS — N179 Acute kidney failure, unspecified: Secondary | ICD-10-CM

## 2022-02-28 DIAGNOSIS — F1910 Other psychoactive substance abuse, uncomplicated: Secondary | ICD-10-CM | POA: Diagnosis not present

## 2022-02-28 DIAGNOSIS — T8450XA Infection and inflammatory reaction due to unspecified internal joint prosthesis, initial encounter: Secondary | ICD-10-CM

## 2022-02-28 DIAGNOSIS — A4902 Methicillin resistant Staphylococcus aureus infection, unspecified site: Secondary | ICD-10-CM

## 2022-02-28 DIAGNOSIS — G928 Other toxic encephalopathy: Secondary | ICD-10-CM | POA: Diagnosis not present

## 2022-02-28 LAB — COMPREHENSIVE METABOLIC PANEL
ALT: 11 U/L (ref 0–44)
AST: 30 U/L (ref 15–41)
Albumin: 2.3 g/dL — ABNORMAL LOW (ref 3.5–5.0)
Alkaline Phosphatase: 81 U/L (ref 38–126)
Anion gap: 8 (ref 5–15)
BUN: 25 mg/dL — ABNORMAL HIGH (ref 8–23)
CO2: 19 mmol/L — ABNORMAL LOW (ref 22–32)
Calcium: 8.1 mg/dL — ABNORMAL LOW (ref 8.9–10.3)
Chloride: 114 mmol/L — ABNORMAL HIGH (ref 98–111)
Creatinine, Ser: 1.43 mg/dL — ABNORMAL HIGH (ref 0.61–1.24)
GFR, Estimated: 53 mL/min — ABNORMAL LOW (ref >60)
Glucose, Bld: 84 mg/dL (ref 70–99)
Potassium: 4.5 mmol/L (ref 3.5–5.1)
Sodium: 141 mmol/L (ref 135–145)
Total Bilirubin: 0.6 mg/dL (ref 0.3–1.2)
Total Protein: 6.5 g/dL (ref 6.5–8.1)

## 2022-02-28 LAB — CBC
HCT: 23.3 % — ABNORMAL LOW (ref 39.0–52.0)
Hemoglobin: 7.3 g/dL — ABNORMAL LOW (ref 13.0–17.0)
MCH: 31.1 pg (ref 26.0–34.0)
MCHC: 31.3 g/dL (ref 30.0–36.0)
MCV: 99.1 fL (ref 80.0–100.0)
Platelets: 197 10*3/uL (ref 150–400)
RBC: 2.35 MIL/uL — ABNORMAL LOW (ref 4.22–5.81)
RDW: 13.9 % (ref 11.5–15.5)
WBC: 5.3 10*3/uL (ref 4.0–10.5)
nRBC: 0 % (ref 0.0–0.2)

## 2022-02-28 MED ORDER — HYDRALAZINE HCL 20 MG/ML IJ SOLN
5.0000 mg | INTRAMUSCULAR | Status: DC | PRN
  Administered 2022-02-28 (×2): 5 mg via INTRAVENOUS
  Filled 2022-02-28 (×2): qty 1

## 2022-02-28 MED ORDER — LACTULOSE 10 GM/15ML PO SOLN
20.0000 g | Freq: Three times a day (TID) | ORAL | Status: DC
  Administered 2022-02-28 – 2022-03-02 (×5): 20 g via ORAL
  Filled 2022-02-28 (×7): qty 30

## 2022-02-28 NOTE — Progress Notes (Signed)
  Progress Note   Patient: Mark [name]. KXF:[id number] DOB: 10-12-1955 DOA: 02/26/2022     2 DOS: the patient was seen and examined on 02/28/2022   Brief hospital course: 66 y.o. male with medical history significant of ETOH abuse, cirrhosis, HLD, HTN, CVA presents with altered mental status. Pt is currently obtunded, unable to obtain from pt. Pt out trick or treating with grandchildren. Upon return, pt was drowsy, difficult to arouse. On AM of admit, pt was unable to arouse. Spouse believes pt may have done herion.Spouse reports ongoing etoh abuse. Spouse reports on-going R knee pain in post-op knee   In the ED, pt was found to be hypothermic with temp of 93.9, requiring bair hugger. Pt was given trial of narcan. Pt still encephalopathic. Pt started on broad spec abx. Hospitialist consulted  Assessment and Plan: Sepsis secondary to R knee infection present on admission -Presenting with enephalopathy, hypothermia -CXR clear, UA unremarkable -R knee edematous. R knee xray reviewed, findings worrisome for joint infection in post-op knee -Appreciate input by Orthopedic Surgery. Recs to continue broad spec abx. Pt s/p arthrocentesis with finding of over 11k WBC's. Fluid cx now pos for rare staph areus -Blood cx neg thus far -Currently continued on empiric broad spec abx  - No plan for surgery unless pt has worsening condition per Orthopedics -Have consulted ID for assistance with abx regimen here   Toxic metabolic encephalopathy -Unclear etiology -Ammonia mildly elevated -Clinically septic per above -Cont broad spec abx for now -Will give trial of PO lactulose -trial of narcan given in ED without much improvement   R knee swelling -Per above, will f/u on knee xray -Per pt's spouse, pt has been complaining of on-going R knee pain since most recent knee surgery ->11k WBC's in fluid, culture pos for rare staph areus -Cont broad spec abx for now   ETOH cirrhosis, ETOH abuse -Will cont  on CIWA protocol -will need cessation -No drainable ascites on [place]   HTN -BP stable at present   Hypothermia -resolved, did require bair hugger initially -cont treatment per above      Subjective: Less drowsy this AM and arousable. Does report feeling thirsty this AM  Physical Exam: Vitals:   02/28/22 1206 02/28/22 1300 02/28/22 1338 02/28/22 1400  BP:  (!) 160/75  (!) 157/63  Pulse:  88 83 85  Resp:  14  15  Temp: 98 F (36.7 C)     TempSrc: Oral     SpO2:  97%  96%  Weight:      Height:       General exam: Arousable, conversant, in no acute distress Respiratory system: normal chest rise, clear, no audible wheezing Cardiovascular system: regular rhythm, s1-s2 Gastrointestinal system: Nondistended, nontender, pos BS Central nervous system: No seizures, no tremors Extremities: No cyanosis, no joint deformities Skin: No rashes, no pallor Psychiatry: Difficult to assess given mentation  Data Reviewed:  Labs reviewed: Na 141, K 4.5, Cr 1.43, Hgb 7.3, WBC 5.3   Family Communication: Pt in room, family not at bedside  Disposition: Status is: Inpatient Remains inpatient appropriate because: Severity of illness  Planned Discharge Destination: Home    Author: Marylu Lund, MD 02/28/2022 3:29 PM  For on call review [email or web address].

## 2022-02-28 NOTE — Plan of Care (Signed)
Discussed with patient plan of care for the evening, pain management and bedtime medications with no evidence of learning at this time.  Problem: Education: Goal: Knowledge of General Education information will improve Description: Including pain rating scale, medication(s)/side effects and non-pharmacologic comfort measures Outcome: Progressing   Problem: Health Behavior/Discharge Planning: Goal: Ability to manage health-related needs will improve Outcome: Not Progressing

## 2022-02-28 NOTE — Progress Notes (Signed)
Subjective:    Patient is a bit more alert this morning. He responds to his name, and answers a few questions with short answers. He tells me he is doing okay. Patient quickly falls back asleep.   Objective: Vital signs in last 24 hours: Temp:  [97.7 F (36.5 C)-98.9 F (37.2 C)] 97.9 F (36.6 C) (11/03 0454) Pulse Rate:  [83-99] 85 (11/03 0700) Resp:  [10-19] 13 (11/03 0700) BP: (138-169)/(54-73) 153/61 (11/03 0700) SpO2:  [94 %-98 %] 95 % (11/03 0700)  Intake/Output from previous day:  Intake/Output Summary (Last 24 hours) at 02/28/2022 0731 Last data filed at 02/28/2022 0600 Gross per 24 hour  Intake 2544.41 ml  Output 1750 ml  Net 794.41 ml     Intake/Output this shift: No intake/output data recorded.  Labs: Recent Labs    02/26/22 1120 02/26/22 1502 02/27/22 0304 02/28/22 0231  HGB 8.7* 7.6* 7.3* 7.3*   Recent Labs    02/27/22 0304 02/28/22 0231  WBC 5.3 5.3  RBC 2.33* 2.35*  HCT 22.9* 23.3*  PLT 208 197   Recent Labs    02/27/22 0304 02/28/22 0231  NA 139 141  K 4.6 4.5  CL 113* 114*  CO2 20* 19*  BUN 32* 25*  CREATININE 1.38* 1.43*  GLUCOSE 85 84  CALCIUM 8.2* 8.1*   No results for input(s): "LABPT", "INR" in the last 72 hours.  Exam: General - Patient is Alert  Extremity   Right Knee Exam: Bandage in place still from aspiration.  No effusion. Minimal warmth. Well healed arthroplasty scar. He does not arise from sleep with palpation over the knee.  Motor Function - did not follow commands to assess  Past Medical History:  Diagnosis Date   Alcohol abuse    Anemia    Arthritis    Ascites    Cirrhosis ([name])    Coffee ground emesis    Dehydration 06/17/2017   Febrile illness    GERD (gastroesophageal reflux disease)    Heart murmur    History of blood transfusion    Hyperlipidemia    Hypertension    Leg swelling    Myocardial infarction ([place]) 2012   Preop cardiovascular exam 04/14/2013   Sepsis ([place]) 06/17/2017   Septic  shock ([place]) 06/18/2017   SIRS (systemic inflammatory response syndrome) ([place]) 07/11/2017   Stroke ([place]) 10/2009   TIA   Thrombocytopenia ([name])     Assessment/Plan:    Principal Problem:   Toxic metabolic encephalopathy  Estimated body mass index is 20.28 kg/m as calculated from the following:   Height as of this encounter: '5\' 6"'$  (1.676 m).   Weight as of this encounter: 57 kg.  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  He had a resection of his knee 02/2020, with 6 weeks of IV antibiotics followed by an oral regimen with doxycycline. He had reimplantation 07/2020 and continued on oral doxycycline. He unfortunately discontinued this for a period of time, had recurrence of acute infection and underwent I&D on 11/13/2020. He returned to oral doxycycline after this surgery.  We last saw the patient in office in December 2022, at which point Dr. Alvan Dame discussed with the patient that unfortunately he does have a chronic infection which will require long term suppression. He may choose to undergo repeat resection if desired, but there was discussion about the risk when removing these revision components.  At that time, the patient did not wish to undergo another surgery, and agreed to remain on oral antibiotics.  Patient recently saw Dr. Megan Salon (Infectious Disease) in office on 02/05/22 who at that time told the patient that he did not feel the MRSA prosthetic joint infection was resolved, but felt it was stable and chronically suppressed with his doxycycline.   Currently, his knee appears in the same condition clinically that it has over the past year. His synovial fluid analysis revealed 11000 WBC, although the culture so far has not revealed any growth. His blood cultures to date have been negative.  I discussed with Dr. Alvan Dame yesterday, who agreed and advised that we  would not consider surgical intervention unless patient has a worsening of his condition. Patient is chronically anemic, with current hemoglobin of 7.3. He is also still rather obtunded.   Medicine team may determine whether he continues to require broad spectrum antibiotics vs returning to his oral doxycycline.   Orthopaedics will be available as needed for re-assessment, and otherwise will continue to follow his cultures.    Griffith Citron, PA-C Orthopedic Surgery [phone] 02/28/2022, 7:31 AM

## 2022-02-28 NOTE — Consult Note (Signed)
[name]    Date of Admission:  02/26/2022   Total days of antibiotics: 2 vanco/cefepime/flagyl--> vanco               Reason for Consult: Sepsis    Referring Provider: Earlie Counts   Assessment: Staph prosthetic joint infection MRSA previously (11-08-20 aspirate), current Cx pending.  AKI- GFR 53 ETOH abuse   Plan: Continue vanco alone for now Consider adding rifampin His LFTs are normal but his ETOH use may make this high risk Await his BCx to determine need for TEE.  Check ESR and CRP for f/u.  HIV (-), Hep A immune, Hep B/C (-) Consider Hep B vaccine if not already done.  Watch Cr (has been increasing, variable in the past) Dr Baxter Flattery will f/u over w/e.   Thank you so much for this interesting consult,  Principal Problem:   Toxic metabolic encephalopathy    Chlorhexidine Gluconate Cloth  6 each Topical Daily   folic acid  1 mg Oral Daily   heparin  5,000 Units Subcutaneous Q8H   lactulose  20 g Oral TID   LORazepam  0-4 mg Intravenous Q12H   multivitamin with minerals  1 tablet Oral Daily   mouth rinse  15 mL Mouth Rinse 4 times per day   thiamine  100 mg Oral Daily   Or   thiamine  100 mg Intravenous Daily    HPI: Mark [name]. is a 66 y.o. male with hx of ETOH use, cirrhosis, prev CVA, is brought to hospital on 11-1 after becoming obtunded after taking grandchildren out trick or treating. Spouse suspected illicit substance use.  His hx is also notable for previous knee revision surgery (R with anbx spacer 02-2020) due to infection with MRSA, re-implantation 07-2020. He was continued on doxy after this (possibly has stopped?). His ortho f/u 03-2021 felt that he had a chronic infection and was to remain on long term anbx. He did not want another surgery. He had ID f/u last month and was continued on his doxy.    In ED he was hypothermic (93.9) and was started on broad anbx.  He was eval by ortho and noted to have loosening of  his prosthesis on his plain film, he had a joint aspirate done as well (non-purulent). His knee was felt to be similar on exam, since his surgery. He is not felt to be candidate for surgery currently.   He has been afebrile, normo/hypertensive.    Review of Systems: Review of Systems  Unable to perform ROS: Mental status change  Constitutional:  Negative for chills and fever.  Gastrointestinal:  Negative for constipation and diarrhea.  Genitourinary:  Negative for dysuria.  Musculoskeletal:  Positive for joint pain.    Past Medical History:  Diagnosis Date   Alcohol abuse    Anemia    Arthritis    Ascites    Cirrhosis ([place])    Coffee ground emesis    Dehydration 06/17/2017   Febrile illness    GERD (gastroesophageal reflux disease)    Heart murmur    History of blood transfusion    Hyperlipidemia    Hypertension    Leg swelling    Myocardial infarction ([place]) 2012   Preop cardiovascular exam 04/14/2013   Sepsis ([place]) 06/17/2017   Septic shock ([place]) 06/18/2017   SIRS (systemic inflammatory response syndrome) ([place]) 07/11/2017   Stroke ([place]) 10/2009   TIA   Thrombocytopenia (  [place])     Social History   Tobacco Use   Smoking status: Former    Packs/day: 0.50    Years: 10.00    Total pack years: 5.00    Types: Cigarettes    Quit date: 01/05/2016    Years since quitting: 6.1   Smokeless tobacco: Never   Tobacco comments:    4 cigarettes a day  Vaping Use   Vaping Use: Never used  Substance Use Topics   Alcohol use: Yes    Comment: 2-3 beers/day   Drug use: Yes    Types: Marijuana    Family History  Problem Relation Age of Onset   Hypertension Father    Diabetes Father    Dementia Mother    Lupus Sister      Medications: Scheduled:  Chlorhexidine Gluconate Cloth  6 each Topical Daily   folic acid  1 mg Oral Daily   heparin  5,000 Units Subcutaneous Q8H   lactulose  20 g Oral TID   LORazepam  0-4 mg Intravenous Q12H   multivitamin with minerals  1  tablet Oral Daily   mouth rinse  15 mL Mouth Rinse 4 times per day   thiamine  100 mg Oral Daily   Or   thiamine  100 mg Intravenous Daily   Continuous:  ceFEPime (MAXIPIME) IV Stopped (02/28/22 1016)   lactated ringers 100 mL/hr at 02/28/22 1414   metronidazole Stopped (02/28/22 1206)   vancomycin Stopped (02/28/22 1347)    Abtx:  Anti-infectives (From admission, onward)    Start     Dose/Rate Route Frequency Ordered Stop   02/27/22 1200  vancomycin (VANCOREADY) IVPB 750 mg/150 mL        750 mg 150 mL/hr over 60 Minutes Intravenous Every 24 hours 02/26/22 1539 03/06/22 1159   02/26/22 2200  metroNIDAZOLE (FLAGYL) IVPB 500 mg        500 mg 100 mL/hr over 60 Minutes Intravenous 2 times daily 02/26/22 1537 03/05/22 0959   02/26/22 2200  ceFEPIme (MAXIPIME) 2 g in sodium chloride 0.9 % 100 mL IVPB        2 g 200 mL/hr over 30 Minutes Intravenous Every 12 hours 02/26/22 1539 03/05/22 2159   02/26/22 1515  metroNIDAZOLE (FLAGYL) IVPB 500 mg  Status:  Discontinued        500 mg 100 mL/hr over 60 Minutes Intravenous 2 times daily 02/26/22 1459 02/26/22 1537   02/26/22 1500  ceFEPIme (MAXIPIME) 2 g in sodium chloride 0.9 % 100 mL IVPB  Status:  Discontinued        2 g 200 mL/hr over 30 Minutes Intravenous  Once 02/26/22 1459 02/26/22 1536   02/26/22 1500  vancomycin (VANCOCIN) IVPB 1000 mg/200 mL premix  Status:  Discontinued        1,000 mg 200 mL/hr over 60 Minutes Intravenous  Once 02/26/22 1459 02/26/22 1536   02/26/22 1130  ceFEPIme (MAXIPIME) 2 g in sodium chloride 0.9 % 100 mL IVPB        2 g 200 mL/hr over 30 Minutes Intravenous  Once 02/26/22 1122 02/26/22 1230   02/26/22 1130  metroNIDAZOLE (FLAGYL) IVPB 500 mg        500 mg 100 mL/hr over 60 Minutes Intravenous  Once 02/26/22 1122 02/26/22 1318   02/26/22 1130  vancomycin (VANCOCIN) IVPB 1000 mg/200 mL premix        1,000 mg 200 mL/hr over 60 Minutes Intravenous  Once 02/26/22 1122 02/26/22 1519  OBJECTIVE: Blood pressure (!) 165/65, pulse 82, temperature 98 F (36.7 C), temperature source Oral, resp. rate 16, height _0  (1.676 m), weight 57 kg, SpO2 96 %.  Physical Exam Constitutional:      General: He is not in acute distress.    Appearance: He is ill-appearing. He is not toxic-appearing.  HENT:     Mouth/Throat:     Mouth: Mucous membranes are moist.     Pharynx: No oropharyngeal exudate.  Eyes:     Extraocular Movements: Extraocular movements intact.  Cardiovascular:     Rate and Rhythm: Normal rate and regular rhythm.     Heart sounds: Murmur heard.  Pulmonary:     Effort: Pulmonary effort is normal.     Breath sounds: Normal breath sounds.  Abdominal:     General: Bowel sounds are normal. There is no distension.     Palpations: Abdomen is soft.     Tenderness: There is no abdominal tenderness.  Musculoskeletal:     Cervical back: Normal range of motion and neck supple.     Right lower leg: Edema present.     Left lower leg: No edema.       Legs:     Comments: Swollen, tender, hot     Lab Results Results for orders placed or performed during the hospital encounter of 02/26/22 (from the past 48 hour(s))  Lactic acid, plasma     Status: None   Collection Time: 02/26/22  5:36 PM  Result Value Ref Range   Lactic Acid, Venous 1.3 0.5 - 1.9 mmol/L    Comment: Performed at [facility], [place] [address], [place], [place] [address]  MRSA Next Gen by PCR, Nasal     Status: None   Collection Time: 02/26/22  6:22 PM   Specimen: Nasal Mucosa; Nasal Swab  Result Value Ref Range   MRSA by PCR Next Gen NOT DETECTED NOT DETECTED    Comment: (NOTE) The GeneXpert MRSA Assay (FDA approved for NASAL specimens only), is one component of a comprehensive MRSA colonization surveillance program. It is not intended to diagnose MRSA infection nor to guide or monitor treatment for MRSA infections. Test performance is not FDA approved in patients less  than 4 years old. Performed at [facility], [place] [address], [place], [place] [address]   Body fluid culture w Gram Stain     Status: None (Preliminary result)   Collection Time: 02/26/22  8:09 PM   Specimen: Synovium; Body Fluid  Result Value Ref Range   Specimen Description      SYNOVIAL R KNEE Performed at [place] [address] [place], [place] [address]    Special Requests      NONE Performed at [facility], [place] [address], [place], [place] [address]    Gram Stain NO WBC SEEN NO ORGANISMS SEEN     Culture      RARE STAPHYLOCOCCUS AUREUS CULTURE REINCUBATED FOR BETTER GROWTH CRITICAL RESULT CALLED TO, READ BACK BY AND VERIFIED WITH: RN K.HOPE AT 1200 ON 02/28/2022 BY T.SAAD. Performed at [facility], [place] [address], [place], [place] [address]    Report Status PENDING   Synovial cell count + diff, w/ crystals     Status: Abnormal   Collection Time: 02/26/22  8:09 PM  Result Value Ref Range   Color, Synovial RED (A) YELLOW   Appearance-Synovial TURBID (A) CLEAR   Crystals, Fluid NO CRYSTALS SEEN  WBC, Synovial 11,005 (H) 0 - 200 /cu mm   Neutrophil, Synovial 87 (H) 0 - 25 %   Lymphocytes-Synovial Fld 3 0 - 20 %   Monocyte-Macrophage-Synovial Fluid 9 (L) 50 - 90 %   Eosinophils-Synovial 1 0 - 1 %    Comment: Performed at [facility], [name] [address], [place], [place] [address]  Comprehensive metabolic panel     Status: Abnormal   Collection Time: 02/27/22  3:04 AM  Result Value Ref Range   Sodium 139 135 - 145 mmol/L   Potassium 4.6 3.5 - 5.1 mmol/L   Chloride 113 (H) 98 - 111 mmol/L   CO2 20 (L) 22 - 32 mmol/L   Glucose, Bld 85 70 - 99 mg/dL    Comment: Glucose reference range applies only to samples taken after fasting for at least 8 hours.   BUN 32 (H) 8 - 23 mg/dL   Creatinine, Ser 1.38 (H) 0.61 - 1.24 mg/dL   Calcium 8.2 (L) 8.9 - 10.3 mg/dL   Total Protein 6.5 6.5 -  8.1 g/dL   Albumin 2.3 (L) 3.5 - 5.0 g/dL   AST 29 15 - 41 U/L   ALT 12 0 - 44 U/L   Alkaline Phosphatase 84 38 - 126 U/L   Total Bilirubin 0.8 0.3 - 1.2 mg/dL   GFR, Estimated 56 (L) >60 mL/min    Comment: (NOTE) Calculated using the CKD-EPI Creatinine Equation (2021)    Anion gap 6 5 - 15    Comment: Performed at [facility], [place] [address] [place], [place] [address]  CBC     Status: Abnormal   Collection Time: 02/27/22  3:04 AM  Result Value Ref Range   WBC 5.3 4.0 - 10.5 K/uL   RBC 2.33 (L) 4.22 - 5.81 MIL/uL   Hemoglobin 7.3 (L) 13.0 - 17.0 g/dL   HCT 22.9 (L) 39.0 - 52.0 %   MCV 98.3 80.0 - 100.0 fL   MCH 31.3 26.0 - 34.0 pg   MCHC 31.9 30.0 - 36.0 g/dL   RDW 14.1 11.5 - 15.5 %   Platelets 208 150 - 400 K/uL   nRBC 0.0 0.0 - 0.2 %    Comment: Performed at [facility], [place] [address], [place], [place] [address]  Ammonia     Status: Abnormal   Collection Time: 02/27/22  7:52 AM  Result Value Ref Range   Ammonia 62 (H) 9 - 35 umol/L    Comment: Performed at [facility], [place] [address], [place],  [address]  Comprehensive metabolic panel     Status: Abnormal   Collection Time: 02/28/22  2:31 AM  Result Value Ref Range   Sodium 141 135 - 145 mmol/L   Potassium 4.5 3.5 - 5.1 mmol/L   Chloride 114 (H) 98 - 111 mmol/L   CO2 19 (L) 22 - 32 mmol/L   Glucose, Bld 84 70 - 99 mg/dL    Comment: Glucose reference range applies only to samples taken after fasting for at least 8 hours.   BUN 25 (H) 8 - 23 mg/dL   Creatinine, Ser 1.43 (H) 0.61 - 1.24 mg/dL   Calcium 8.1 (L) 8.9 - 10.3 mg/dL   Total Protein 6.5 6.5 - 8.1 g/dL   Albumin 2.3 (L) 3.5 - 5.0 g/dL   AST 30 15 - 41 U/L   ALT 11 0 - 44 U/L   Alkaline Phosphatase 81 38 - 126 U/L  Total Bilirubin 0.6 0.3 - 1.2 mg/dL   GFR, Estimated 53 (L) >60 mL/min    Comment: (NOTE) Calculated using the CKD-EPI Creatinine Equation (2021)    Anion gap 8 5 - 15     Comment: Performed at [facility], [place] [address], [place], [place] [address]  CBC     Status: Abnormal   Collection Time: 02/28/22  2:31 AM  Result Value Ref Range   WBC 5.3 4.0 - 10.5 K/uL   RBC 2.35 (L) 4.22 - 5.81 MIL/uL   Hemoglobin 7.3 (L) 13.0 - 17.0 g/dL   HCT 23.3 (L) 39.0 - 52.0 %   MCV 99.1 80.0 - 100.0 fL   MCH 31.1 26.0 - 34.0 pg   MCHC 31.3 30.0 - 36.0 g/dL   RDW 13.9 11.5 - 15.5 %   Platelets 197 150 - 400 K/uL   nRBC 0.0 0.0 - 0.2 %    Comment: Performed at [facility], [place] [address] [place], [place] [address]      Component Value Date/Time   SDES  02/26/2022 2009    SYNOVIAL R KNEE Performed at [facility], [place] [address], [place], [place] [address]    SPECREQUEST  02/26/2022 2009    NONE Performed at [facility], [place] [address] [place], [place] [address]    CULT  02/26/2022 2009    RARE STAPHYLOCOCCUS AUREUS CULTURE REINCUBATED FOR BETTER GROWTH CRITICAL RESULT CALLED TO, READ BACK BY AND VERIFIED WITH: RN K.HOPE AT 1200 ON 02/28/2022 BY T.SAAD. Performed at [facility], [place] [address] [place], [place] [address]    REPTSTATUS PENDING 02/26/2022 2009   [place] ASCITES (ABDOMEN LIMITED)  Result Date: 02/27/2022 CLINICAL DATA:  Provided history: Ascites. EXAM: LIMITED ABDOMEN ULTRASOUND FOR ASCITES TECHNIQUE: A limited sonographic survey of the abdomen was performed in all four quadrants to assess for ascites. COMPARISON:  Abdominal radiograph 05/19/2018. CT abdomen/pelvis 06/17/2017. FINDINGS: Limited sonographic survey of the abdomen performed in all four quadrants to assess for ascites. No significant ascites identified. IMPRESSION: No significant abdominal ascites. Electronically Signed   By: Kellie Simmering D.O.   On: 02/27/2022 11:53   Recent Results (from the past 240 hour(s))  Resp Panel by RT-PCR (Flu A&B, Covid) Anterior Nasal Swab     Status: None   Collection Time:  02/26/22 11:20 AM   Specimen: Anterior Nasal Swab  Result Value Ref Range Status   SARS Coronavirus 2 by RT PCR NEGATIVE NEGATIVE Final    Comment: (NOTE) SARS-CoV-2 target nucleic acids are NOT DETECTED.  The SARS-CoV-2 RNA is generally detectable in upper respiratory specimens during the acute phase of infection. The lowest concentration of SARS-CoV-2 viral copies this assay can detect is 138 copies/mL. A negative result does not preclude SARS-Cov-2 infection and should not be used as the sole basis for treatment or other patient management decisions. A negative result may occur with  improper specimen collection/handling, submission of specimen other than nasopharyngeal swab, presence of viral mutation(s) within the areas targeted by this assay, and inadequate number of viral copies(<138 copies/mL). A negative result must be combined with clinical observations, patient history, and epidemiological information. The expected result is Negative.  Fact Sheet for Patients:  EntrepreneurPulse.com.au  Fact Sheet for Healthcare Providers:  IncredibleEmployment.be  This test is no t yet approved or cleared by the [place] FDA and  has been authorized for detection and/or diagnosis of SARS-CoV-2 by FDA under an Emergency Use  Authorization (EUA). This EUA will remain  in effect (meaning this test can be used) for the duration of the COVID-19 declaration under Section 564(b)(1) of the Act, 21 U.S.C.section 360bbb-3(b)(1), unless the authorization is terminated  or revoked sooner.       Influenza A by PCR NEGATIVE NEGATIVE Final   Influenza B by PCR NEGATIVE NEGATIVE Final    Comment: (NOTE) The Xpert Xpress SARS-CoV-2/FLU/RSV plus assay is intended as an aid in the diagnosis of influenza from Nasopharyngeal swab specimens and should not be used as a sole basis for treatment. Nasal washings and aspirates are unacceptable for Xpert Xpress  SARS-CoV-2/FLU/RSV testing.  Fact Sheet for Patients: EntrepreneurPulse.com.au  Fact Sheet for Healthcare Providers: IncredibleEmployment.be  This test is not yet approved or cleared by the [place] FDA and has been authorized for detection and/or diagnosis of SARS-CoV-2 by FDA under an Emergency Use Authorization (EUA). This EUA will remain in effect (meaning this test can be used) for the duration of the COVID-19 declaration under Section 564(b)(1) of the Act, 21 U.S.C. section 360bbb-3(b)(1), unless the authorization is terminated or revoked.  Performed at [facility], [place] [address], [place], [place] [address]   Culture, blood (routine x 2)     Status: None (Preliminary result)   Collection Time: 02/26/22 11:30 AM   Specimen: BLOOD  Result Value Ref Range Status   Specimen Description   Final    BLOOD BLOOD LEFT FOREARM Performed at [place] [address], [place], [place] [address]    Special Requests   Final    BOTTLES DRAWN AEROBIC AND ANAEROBIC Blood Culture results may not be optimal due to an inadequate volume of blood received in culture bottles Performed at [place] [address] [place], [place] [address]    Culture   Final    NO GROWTH 2 DAYS Performed at [place] [address], [place], [place] [address]    Report Status PENDING  Incomplete  Culture, blood (routine x 2)     Status: None (Preliminary result)   Collection Time: 02/26/22 11:38 AM   Specimen: BLOOD  Result Value Ref Range Status   Specimen Description   Final    BLOOD LEFT ANTECUBITAL Performed at [place] [address], [place], [place] [address]    Special Requests   Final    BOTTLES DRAWN AEROBIC AND ANAEROBIC Blood Culture results may not be optimal due to an inadequate volume of blood received in culture bottles Performed at [place] [address], [place], [place] [address]    Culture   Final    NO GROWTH 2 DAYS Performed at [place] [address], [place], [place] [address]    Report Status PENDING  Incomplete  Urine Culture     Status: None   Collection Time: 02/26/22  1:55 PM   Specimen: Urine, Clean Catch  Result Value Ref Range Status   Specimen Description   Final    URINE, CLEAN CATCH Performed at [facility], [place] [address] [place], [place] [address]    Special Requests   Final    NONE Performed at [facility], [place] [address] [place], [place] [address]    Culture   Final    NO GROWTH Performed at [facility], [place] [address] [place], [place] [address]    Report Status 02/27/2022 FINAL  Final  MRSA Next Gen by PCR, Nasal     Status: None   Collection Time: 02/26/22  6:22 PM   Specimen: Nasal Mucosa; Nasal Swab  Result Value Ref Range Status   MRSA by PCR Next Gen NOT DETECTED NOT DETECTED Final    Comment: (NOTE) The GeneXpert MRSA Assay (FDA approved for NASAL specimens only), is one component of a comprehensive MRSA colonization surveillance program. It is not intended to diagnose MRSA infection nor to guide or monitor treatment for MRSA infections. Test performance is not FDA approved in patients less than 67 years old. Performed at [facility], [place] [address], [place], [place] [address]   Body fluid culture w Gram Stain     Status: None (Preliminary result)   Collection Time: 02/26/22  8:09 PM   Specimen: Synovium; Body Fluid  Result Value Ref Range Status   Specimen Description   Final    SYNOVIAL R KNEE Performed at [place] [address] [place] [address] [place], [place] [address]    Special Requests   Final    NONE Performed at [facility], [place] [address] [place] [address], [place], [place] [address]    Gram Stain NO WBC SEEN NO ORGANISMS SEEN   Final   Culture    Final    RARE STAPHYLOCOCCUS AUREUS CULTURE REINCUBATED FOR BETTER GROWTH CRITICAL RESULT CALLED TO, READ BACK BY AND VERIFIED WITH: RN K.HOPE AT 1200 ON 02/28/2022 BY T.SAAD. Performed at [facility], [place] [address] [place] [address], [place], [place] [address]    Report Status PENDING  Incomplete    Microbiology: Recent Results (from the past 240 hour(s))  Resp Panel by RT-PCR (Flu A&B, Covid) Anterior Nasal Swab     Status: None   Collection Time: 02/26/22 11:20 AM   Specimen: Anterior Nasal Swab  Result Value Ref Range Status   SARS Coronavirus 2 by RT PCR NEGATIVE NEGATIVE Final    Comment: (NOTE) SARS-CoV-2 target nucleic acids are NOT DETECTED.  The SARS-CoV-2 RNA is generally detectable in upper respiratory specimens during the acute phase of infection. The lowest concentration of SARS-CoV-2 viral copies this assay can detect is 138 copies/mL. A negative result does not preclude SARS-Cov-2 infection and should not be used as the sole basis for treatment or other patient management decisions. A negative result may occur with  improper specimen collection/handling, submission of specimen other than nasopharyngeal swab, presence of viral mutation(s) within the areas targeted by this assay, and inadequate number of viral copies(<138 copies/mL). A negative result must be combined with clinical observations, patient history, and epidemiological information. The expected result is Negative.  Fact Sheet for Patients:  EntrepreneurPulse.com.au  Fact Sheet for Healthcare Providers:  IncredibleEmployment.be  This test is no t yet approved or cleared by the [place] FDA and  has been authorized for detection and/or diagnosis of SARS-CoV-2 by FDA under an Emergency Use Authorization (EUA). This EUA will remain  in effect (meaning this test can be used) for the duration of the COVID-19 declaration under Section 564(b)(1) of the Act, 21 U.S.C.section  360bbb-3(b)(1), unless the authorization is terminated  or revoked sooner.       Influenza A by PCR NEGATIVE NEGATIVE Final   Influenza B by PCR NEGATIVE NEGATIVE Final    Comment: (NOTE) The Xpert Xpress SARS-CoV-2/FLU/RSV plus assay is intended as an aid in the diagnosis of influenza from Nasopharyngeal swab specimens and should not be used as a sole basis for treatment. Nasal washings and aspirates  are unacceptable for Xpert Xpress SARS-CoV-2/FLU/RSV testing.  Fact Sheet for Patients: EntrepreneurPulse.com.au  Fact Sheet for Healthcare Providers: IncredibleEmployment.be  This test is not yet approved or cleared by the [place] FDA and has been authorized for detection and/or diagnosis of SARS-CoV-2 by FDA under an Emergency Use Authorization (EUA). This EUA will remain in effect (meaning this test can be used) for the duration of the COVID-19 declaration under Section 564(b)(1) of the Act, 21 U.S.C. section 360bbb-3(b)(1), unless the authorization is terminated or revoked.  Performed at [facility], [place] [address], [place], [place] [address]   Culture, blood (routine x 2)     Status: None (Preliminary result)   Collection Time: 02/26/22 11:30 AM   Specimen: BLOOD  Result Value Ref Range Status   Specimen Description   Final    BLOOD BLOOD LEFT FOREARM Performed at [place] [address], [place], [place] [address]    Special Requests   Final    BOTTLES DRAWN AEROBIC AND ANAEROBIC Blood Culture results may not be optimal due to an inadequate volume of blood received in culture bottles Performed at [place] [address] [place], [place] [address]    Culture   Final    NO GROWTH 2 DAYS Performed at [place] [address] [place] [address], [place], [place] [address]    Report Status PENDING  Incomplete  Culture, blood (routine x 2)     Status: None (Preliminary  result)   Collection Time: 02/26/22 11:38 AM   Specimen: BLOOD  Result Value Ref Range Status   Specimen Description   Final    BLOOD LEFT ANTECUBITAL Performed at [place] [address], [place], [name] [address]    Special Requests   Final    BOTTLES DRAWN AEROBIC AND ANAEROBIC Blood Culture results may not be optimal due to an inadequate volume of blood received in culture bottles Performed at [place] [address], [place], [place] [address]    Culture   Final    NO GROWTH 2 DAYS Performed at [place] [address], [place], [place] [address]    Report Status PENDING  Incomplete  Urine Culture     Status: None   Collection Time: 02/26/22  1:55 PM   Specimen: Urine, Clean Catch  Result Value Ref Range Status   Specimen Description   Final    URINE, CLEAN CATCH Performed at [name], [place] [address] [place], [place] [address]    Special Requests   Final    NONE Performed at [facility], [place] [address] [place], [place] [address]    Culture   Final    NO GROWTH Performed at [name], [place] [address], [place],  [address]    Report Status 02/27/2022 FINAL  Final  MRSA Next Gen by PCR, Nasal     Status: None   Collection Time: 02/26/22  6:22 PM   Specimen: Nasal Mucosa; Nasal Swab  Result Value Ref Range Status   MRSA by PCR Next Gen NOT DETECTED NOT DETECTED Final    Comment: (NOTE) The GeneXpert MRSA Assay (FDA approved for NASAL specimens only), is one component of a comprehensive MRSA colonization surveillance program. It is not intended to diagnose MRSA infection nor to guide or monitor treatment for MRSA infections. Test performance is not FDA approved in patients less than 80 years old. Performed at  [facility], [place] [address] [place], [place] [address]   Body fluid culture w Gram Stain     Status: None (Preliminary  result)   Collection Time: 02/26/22  8:09 PM   Specimen: Synovium; Body Fluid  Result Value Ref Range Status   Specimen Description   Final    SYNOVIAL R KNEE Performed at [facility] [address], [place], [place] [address]    Special Requests   Final    NONE Performed at [facility], [place] [address], [place], [place] [address]    Gram Stain NO WBC SEEN NO ORGANISMS SEEN   Final   Culture   Final    RARE STAPHYLOCOCCUS AUREUS CULTURE REINCUBATED FOR BETTER GROWTH CRITICAL RESULT CALLED TO, READ BACK BY AND VERIFIED WITH: RN K.HOPE AT 1200 ON 02/28/2022 BY T.SAAD. Performed at [facility], [place] [address], [place], [place] [address]    Report Status PENDING  Incomplete    Radiographs and labs were personally reviewed by me.   Bobby Rumpf, MD [facility] [phone] 02/28/2022, 4:23 PM

## 2022-03-01 DIAGNOSIS — F1910 Other psychoactive substance abuse, uncomplicated: Secondary | ICD-10-CM | POA: Diagnosis not present

## 2022-03-01 DIAGNOSIS — T68XXXA Hypothermia, initial encounter: Secondary | ICD-10-CM | POA: Diagnosis not present

## 2022-03-01 DIAGNOSIS — N179 Acute kidney failure, unspecified: Secondary | ICD-10-CM | POA: Diagnosis not present

## 2022-03-01 LAB — CBC
HCT: 22.2 % — ABNORMAL LOW (ref 39.0–52.0)
Hemoglobin: 7.1 g/dL — ABNORMAL LOW (ref 13.0–17.0)
MCH: 31.6 pg (ref 26.0–34.0)
MCHC: 32 g/dL (ref 30.0–36.0)
MCV: 98.7 fL (ref 80.0–100.0)
Platelets: 176 10*3/uL (ref 150–400)
RBC: 2.25 MIL/uL — ABNORMAL LOW (ref 4.22–5.81)
RDW: 13.9 % (ref 11.5–15.5)
WBC: 4.3 10*3/uL (ref 4.0–10.5)
nRBC: 0 % (ref 0.0–0.2)

## 2022-03-01 LAB — COMPREHENSIVE METABOLIC PANEL
ALT: 11 U/L (ref 0–44)
AST: 26 U/L (ref 15–41)
Albumin: 2.3 g/dL — ABNORMAL LOW (ref 3.5–5.0)
Alkaline Phosphatase: 82 U/L (ref 38–126)
Anion gap: 8 (ref 5–15)
BUN: 17 mg/dL (ref 8–23)
CO2: 18 mmol/L — ABNORMAL LOW (ref 22–32)
Calcium: 8.4 mg/dL — ABNORMAL LOW (ref 8.9–10.3)
Chloride: 115 mmol/L — ABNORMAL HIGH (ref 98–111)
Creatinine, Ser: 1.19 mg/dL (ref 0.61–1.24)
GFR, Estimated: >60 mL/min (ref >60)
Glucose, Bld: 83 mg/dL (ref 70–99)
Potassium: 4 mmol/L (ref 3.5–5.1)
Sodium: 141 mmol/L (ref 135–145)
Total Bilirubin: 0.8 mg/dL (ref 0.3–1.2)
Total Protein: 6.7 g/dL (ref 6.5–8.1)

## 2022-03-01 MED ORDER — VANCOMYCIN HCL IN DEXTROSE 1-5 GM/200ML-% IV SOLN: 1000.0000 mg | INTRAVENOUS | Status: DC

## 2022-03-01 MED ORDER — VANCOMYCIN HCL 750 MG/150ML IV SOLN
750.0000 mg | INTRAVENOUS | Status: DC
  Administered 2022-03-01 – 2022-03-02 (×2): 750 mg via INTRAVENOUS
  Filled 2022-03-01 (×3): qty 150

## 2022-03-01 MED ORDER — LORAZEPAM 2 MG/ML IJ SOLN
0.0000 mg | Freq: Two times a day (BID) | INTRAMUSCULAR | Status: AC
  Administered 2022-03-01 (×2): 1 mg via INTRAVENOUS
  Filled 2022-03-01 (×3): qty 1

## 2022-03-01 NOTE — Progress Notes (Addendum)
Pharmacy Antibiotic Note  Mark Gloss. is a 66 y.o. 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 Pharmacy has been consulted for cefepime and vancomycin dosing.  ID following. Culture from synovial fluid with staph aureus, pending susceptibilities (patient with h/o MRSA prosthetic knee infection).  SCr has trended down, and now appears consistent with approximate baseline.  Plan: -Continue vancomycin 750 mg IV every 24 hours -Continue cefepime 2 g IV every 12 hours -ID following for antibiotic recommendations   Height: '5\' 6"'$  (167.6 cm) Weight: 57 kg (125 lb 10.6 oz) IBW/kg (Calculated) : 63.8  Temp (24hrs), Avg:98.4 F (36.9 C), Min:98 F (36.7 C), Max:98.5 F (36.9 C)  Recent Labs  Lab 02/26/22 1120 02/26/22 1502 02/26/22 1736 02/27/22 0304 02/28/22 0231 03/01/22 0258  WBC 4.4 5.4  --  5.3 5.3 4.3  CREATININE 1.62* 1.42*  --  1.38* 1.43* 1.19  LATICACIDVEN 1.4 1.0 1.3  --   --   --      Estimated Creatinine Clearance: 47.9 mL/min (by C-G formula based on SCr of 1.19 mg/dL).    No Known Allergies  Microbiology results: 11/1 Synovial fluid: staph aureus 11/1 BCx: ngtd 11/1 UCx: ngF 11/1 MRSA PCR: negative  Thank you for allowing pharmacy to be a part of this patient's care.  Tawnya Crook, PharmD, BCPS Clinical Pharmacist 03/01/2022 9:02 AM

## 2022-03-01 NOTE — Progress Notes (Signed)
      [facility] for Infectious Disease  Date of Admission:  02/26/2022           Reason for visit: Follow up on right knee PJI  Current antibiotics: Vancomycin Cefepime  ASSESSMENT:    66 y.o. 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  Followed by Dr. Megan Salon as an outpatient.  Last been seen in May 2023 at which time he was advised to continue doxycycline indefinitely.  He had a missed appointment on 02/06/22 with Dr. Megan Salon.  Seen by orthopedic surgery this admission whom has currently recommended no surgical intervention.  Aspirate from the right knee on 11/1 shows 11,005 WBC (87% neutrophils) with Staph aureus growing in cultures again.  Susceptibilities are pending but presumably all due to MRSA.  It is unclear if he has continued his doxycycline as recommended due to prior episodes of discontinuation (although has been consistently dispensed by [facility]).  Blood cultures at this time are no growth to date.  #Acute kidney injury on CKD 3 Creatinine is improving and this morning down to 1.19, creatinine clearance 47.9.  #Alcohol use disorder LFTs normal.  T. bili normal.  RECOMMENDATIONS:    Continue vancomycin per pharmacy guidance Discontinue cefepime and MTZ Monitor cultures Lab monitoring Will follow for any surgical plans   Principal Problem:   Infection of prosthetic right knee joint ([place]) Active Problems:   S/P right TKA   CKD (chronic kidney disease) stage 3, GFR 30-59 ml/min ([name])   AKI (acute kidney injury) ([place])   Toxic metabolic encephalopathy      [facility] [phone] pager 03/01/2022, 9:30 AM

## 2022-03-01 NOTE — Evaluation (Signed)
Occupational Therapy Evaluation Patient Details Name: Mark [name]. MRN: [id number] DOB: [birth year] Today's Date: 03/01/2022   History of Present Illness 66 y.o. male with medical history significant of ETOH abuse, cirrhosis, HLD, HTN, CVA presents with altered mental status and history of right knee prosthetic joint infection due to MRSA  Patient with history of complicated, chronic PJI status post multiple prior surgeries.   Clinical Impression   [name] is a 66 year old man admitted to hospital with above medical history and presents with generalized weakness, decreased activity tolerance, impaired balance, altered mental status and right knee pain. Patient needing min assist to transfer and take steps to recliner with walker - exhibiting difficulty advancing feet and maintaining a bent position over the walker. Patient needing increased assistance with ADLs including max assist for LB ADLs and toileting. Patient reports his wife works during the day - so unsure of how much assistance patient will have at home. Patient will benefit from skilled OT services while in hospital to improve deficits and learn compensatory strategies as needed in order to return to PLOF.  Current recommendations is SNF - if patient progresses as confusion lessons will update POC.        Recommendations for follow up therapy are one component of a multi-disciplinary discharge planning process, led by the attending physician.  Recommendations may be updated based on patient status, additional functional criteria and insurance authorization.   Follow Up Recommendations  Skilled nursing-short term rehab (<3 hours/day)    Assistance Recommended at Discharge Frequent or constant Supervision/Assistance  Patient can return home with the following A little help with walking and/or transfers;A lot of help with bathing/dressing/bathroom;Assistance with cooking/housework;Direct supervision/assist for medications  management;Help with stairs or ramp for entrance;Direct supervision/assist for financial management    Functional Status Assessment  Patient has had a recent decline in their functional status and demonstrates the ability to make significant improvements in function in a reasonable and predictable amount of time.  Equipment Recommendations  None recommended by OT    Recommendations for Other Services       Precautions / Restrictions Precautions Precautions: Fall Restrictions Weight Bearing Restrictions: No      Mobility Bed Mobility Overal bed mobility: Needs Assistance Bed Mobility: Supine to Sit     Supine to sit: Min assist     General bed mobility comments: Min assist for hand hold to transfer to edge of bed and patient using bed rails as well.    Transfers Overall transfer level: Needs assistance Equipment used: Rolling walker (2 wheels) Transfers: Sit to/from Stand, Bed to chair/wheelchair/BSC Sit to Stand: Min assist Stand pivot transfers: Min assist, +2 safety/equipment         General transfer comment: Min assist to take two steps to recliner. Patient bent over walker and exhibting difficulty advancing LLE due to difficulty weightbearing through RLE secondary to pain. Only able to transfer to chair.      Balance Overall balance assessment: Needs assistance Sitting-balance support: No upper extremity supported, Feet supported Sitting balance-[name]: Fair     Standing balance support: During functional activity, Reliant on assistive device for balance Standing balance-[name]: Poor                             ADL either performed or assessed with clinical judgement   ADL Overall ADL's : Needs assistance/impaired Eating/Feeding: Set up;Sitting   Grooming: Set up;Sitting  Upper Body Bathing: Set up;Sitting   Lower Body Bathing: Moderate assistance;Sit to/from stand   Upper Body Dressing : Set up;Sitting   Lower Body  Dressing: Moderate assistance;Sit to/from stand   Toilet Transfer: Minimal assistance;+2 for safety/equipment;BSC/3in1;Rolling walker (2 wheels)   Toileting- Clothing Manipulation and Hygiene: Moderate assistance;Sitting/lateral lean       Functional mobility during ADLs: Minimal assistance;+2 for safety/equipment;Rolling walker (2 wheels)       Vision   Vision Assessment?: No apparent visual deficits     Perception     Praxis      Pertinent Vitals/Pain Pain Assessment Pain Assessment: Faces Faces Pain Scale: Hurts even more Pain Location: R knee Pain Descriptors / Indicators: Grimacing, Guarding Pain Intervention(s): Monitored during session, Repositioned     Hand Dominance Right   Extremity/Trunk Assessment Upper Extremity Assessment Upper Extremity Assessment: Overall WFL for tasks assessed   Lower Extremity Assessment Lower Extremity Assessment: Defer to PT evaluation   Cervical / Trunk Assessment Cervical / Trunk Assessment: Normal   Communication Communication Communication: No difficulties   Cognition Arousal/Alertness: Awake/alert (Initially drowsy and then became more alert with activity) Behavior During Therapy: WFL for tasks assessed/performed Overall Cognitive Status: No family/caregiver present to determine baseline cognitive functioning Area of Impairment: Orientation, Memory                 Orientation Level: Person, Place   Memory: Decreased short-term memory         General Comments: Knows the year. Cant' state the President or the Month. Doesn't remember halloween events.     General Comments       Exercises     Shoulder Instructions      Home Living Family/patient expects to be discharged to:: Private residence Living Arrangements: Spouse/significant other Available Help at Discharge: Family;Available PRN/intermittently Type of Home: House Home Access: Stairs to enter [employer] of Steps: 2 Entrance  Stairs-Rails: None Home Layout: One level     Bathroom Shower/Tub: Teacher, early years/pre: Standard     Home Equipment: Conservation officer, nature (2 wheels);BSC/3in1;Grab bars - tub/shower;Shower seat   Additional Comments: PLOF details limited due to patient's confusion - information taken from last visit. Patient reports he was independent prior to admission. Doesn't remember events leading up to hospitliziation.      Prior Functioning/Environment Prior Level of Function : Independent/Modified Independent                        OT Problem List: Decreased strength;Decreased activity tolerance;Impaired balance (sitting and/or standing);Decreased safety awareness;Decreased knowledge of use of DME or AE;Decreased cognition;Pain      OT Treatment/Interventions: Self-care/ADL training;Therapeutic exercise;DME and/or AE instruction;Therapeutic activities;Balance training;Cognitive remediation/compensation;Patient/family education    OT Goals(Current goals can be found in the care plan section) Acute Rehab OT Goals Patient Stated Goal: to get out of here OT Goal Formulation: With patient Time For Goal Achievement: 03/15/22 Potential to Achieve Goals: Good  OT Frequency: Min 2X/week    Co-evaluation              AM-PAC OT "6 Clicks" Daily Activity     Outcome Measure Help from another person eating meals?: A Little Help from another person taking care of personal grooming?: A Little Help from another person toileting, which includes using toliet, bedpan, or urinal?: A Lot Help from another person bathing (including washing, rinsing, drying)?: A Lot Help from another person to put on and taking off regular upper body  clothing?: A Little Help from another person to put on and taking off regular lower body clothing?: A Lot 6 Click Score: 15   End of Session Equipment Utilized During Treatment: Rolling walker (2 wheels);Gait belt Nurse Communication: Mobility  status  Activity Tolerance: Patient tolerated treatment well Patient left: with chair alarm set;in chair  OT Visit Diagnosis: Muscle weakness (generalized) (M62.81);Other symptoms and signs involving cognitive function                Time: 3785-8850 OT Time Calculation (min): 31 min Charges:  OT General Charges $OT Visit: 1 Visit OT Evaluation $OT Eval Low Complexity: 1 Low OT Treatments $Therapeutic Activity: 8-22 mins  Gustavo Lah, OTR/L [facility]  Office [phone]   Lenward Chancellor 03/01/2022, 12:27 PM

## 2022-03-01 NOTE — Progress Notes (Signed)
  Progress Note   Patient: Mark [name]. PFX:[id number] DOB: Sep 06, 1955 DOA: 02/26/2022     3 DOS: the patient was seen and examined on 03/01/2022   Brief hospital course: 66 y.o. male with medical history significant of ETOH abuse, cirrhosis, HLD, HTN, CVA presents with altered mental status. Pt is currently obtunded, unable to obtain from pt. Pt out trick or treating with grandchildren. Upon return, pt was drowsy, difficult to arouse. On AM of admit, pt was unable to arouse. Spouse believes pt may have done herion.Spouse reports ongoing etoh abuse. Spouse reports on-going R knee pain in post-op knee   In the ED, pt was found to be hypothermic with temp of 93.9, requiring bair hugger. Pt was given trial of narcan. Pt still encephalopathic. Pt started on broad spec abx. Hospitialist consulted  Assessment and Plan: Sepsis secondary to R knee infection present on admission -Presenting with enephalopathy, hypothermia -CXR clear, UA unremarkable -R knee edematous. R knee xray reviewed, findings worrisome for joint infection in post-op knee -Appreciate input by Orthopedic Surgery. Recs to continue broad spec abx. Pt s/p arthrocentesis with finding of over 11k WBC's. Fluid cx now pos for rare staph areus -Blood cx neg thus far -Currently continued on empiric broad spec abx  - discussed with Orthopedic Surgery. No plan for surgery unless pt has worsening condition per Orthopedics -Appreciate input by ID, recs to continue Vanc    Toxic metabolic encephalopathy -Unclear etiology -Presented clinically septic per above -Cont on vanc per above -trial of narcan given in ED without much improvement -This AM, mentation much improved   R knee swelling -Per above, will f/u on knee xray -Per pt's spouse, pt has been complaining of on-going R knee pain since most recent knee surgery ->11k WBC's in fluid, culture pos for rare staph areus -Cont vanc per ID   ETOH cirrhosis, ETOH abuse -Will cont on  CIWA protocol -will need cessation -No drainable ascites on [place]   HTN -BP stable at present   Hypothermia -resolved, did require bair hugger initially -cont treatment per above      Subjective: Much more alert, not remembering coming to the hospital and not sure where he is now  Physical Exam: Vitals:   03/01/22 0700 03/01/22 0800 03/01/22 0828 03/01/22 1238  BP: (!) 164/65 (!) 137/50    Pulse: 85     Resp: 15 13    Temp:   98.3 F (36.8 C) 98.3 F (36.8 C)  TempSrc:   Oral Oral  SpO2: 97%     Weight:      Height:       General exam: Awake, laying in bed, in nad Respiratory system: Normal respiratory effort, no wheezing Cardiovascular system: regular rate, s1, s2 Gastrointestinal system: Soft, nondistended, positive BS Central nervous system: CN2-12 grossly intact, strength intact Extremities: Perfused, no clubbing Skin: Normal skin turgor, no notable skin lesions seen Psychiatry: Mood normal // no visual hallucinations   Data Reviewed:  Labs reviewed: Na 141, K 4.0, Cr 1.19, Hgb 7.1, WBC 4.3   Family Communication: Pt in room, family not at bedside  Disposition: Status is: Inpatient Remains inpatient appropriate because: Severity of illness  Planned Discharge Destination: Home    Author: Marylu Lund, MD 03/01/2022 1:51 PM  For on call review [email or web address].

## 2022-03-01 NOTE — Plan of Care (Signed)
  Problem: Activity: Goal: Risk for activity intolerance will decrease Outcome: Progressing   Problem: Safety: Goal: Ability to remain free from injury will improve Outcome: Progressing   Problem: Elimination: Goal: Will not experience complications related to bowel motility Outcome: Progressing Goal: Will not experience complications related to urinary retention Outcome: Progressing

## 2022-03-02 DIAGNOSIS — T68XXXA Hypothermia, initial encounter: Secondary | ICD-10-CM | POA: Diagnosis not present

## 2022-03-02 DIAGNOSIS — N179 Acute kidney failure, unspecified: Secondary | ICD-10-CM | POA: Diagnosis not present

## 2022-03-02 LAB — CBC
HCT: 22.3 % — ABNORMAL LOW (ref 39.0–52.0)
Hemoglobin: 7.3 g/dL — ABNORMAL LOW (ref 13.0–17.0)
MCH: 31.7 pg (ref 26.0–34.0)
MCHC: 32.7 g/dL (ref 30.0–36.0)
MCV: 97 fL (ref 80.0–100.0)
Platelets: 188 K/uL (ref 150–400)
RBC: 2.3 MIL/uL — ABNORMAL LOW (ref 4.22–5.81)
RDW: 13.8 % (ref 11.5–15.5)
WBC: 4.3 K/uL (ref 4.0–10.5)
nRBC: 0 % (ref 0.0–0.2)

## 2022-03-02 LAB — BODY FLUID CULTURE W GRAM STAIN: Gram Stain: NONE SEEN

## 2022-03-02 LAB — COMPREHENSIVE METABOLIC PANEL WITH GFR
ALT: 12 U/L (ref 0–44)
AST: 27 U/L (ref 15–41)
Albumin: 2.6 g/dL — ABNORMAL LOW (ref 3.5–5.0)
Alkaline Phosphatase: 79 U/L (ref 38–126)
Anion gap: 6 (ref 5–15)
BUN: 17 mg/dL (ref 8–23)
CO2: 18 mmol/L — ABNORMAL LOW (ref 22–32)
Calcium: 8.4 mg/dL — ABNORMAL LOW (ref 8.9–10.3)
Chloride: 118 mmol/L — ABNORMAL HIGH (ref 98–111)
Creatinine, Ser: 1.22 mg/dL (ref 0.61–1.24)
GFR, Estimated: >60 mL/min (ref >60)
Glucose, Bld: 85 mg/dL (ref 70–99)
Potassium: 3.9 mmol/L (ref 3.5–5.1)
Sodium: 142 mmol/L (ref 135–145)
Total Bilirubin: 0.8 mg/dL (ref 0.3–1.2)
Total Protein: 7.2 g/dL (ref 6.5–8.1)

## 2022-03-02 MED ORDER — LORAZEPAM 2 MG/ML IJ SOLN
1.0000 mg | Freq: Once | INTRAMUSCULAR | Status: AC
  Administered 2022-03-02: 1 mg via INTRAVENOUS

## 2022-03-02 MED ORDER — ONDANSETRON HCL 4 MG/2ML IJ SOLN
4.0000 mg | Freq: Four times a day (QID) | INTRAMUSCULAR | Status: DC | PRN
  Administered 2022-03-02: 4 mg via INTRAVENOUS
  Filled 2022-03-02: qty 2

## 2022-03-02 MED ORDER — MELATONIN 5 MG PO TABS
5.0000 mg | ORAL_TABLET | Freq: Once | ORAL | Status: AC
  Administered 2022-03-02: 5 mg via ORAL
  Filled 2022-03-02: qty 1

## 2022-03-02 MED ORDER — TRAZODONE HCL 50 MG PO TABS
25.0000 mg | ORAL_TABLET | Freq: Once | ORAL | Status: AC
  Administered 2022-03-02: 25 mg via ORAL
  Filled 2022-03-02: qty 1

## 2022-03-02 NOTE — Progress Notes (Signed)
  Progress Note   Patient: Mark [name]. DEY:[id number] DOB: [birth year] DOA: 02/26/2022     4 DOS: the patient was seen and examined on 03/02/2022   Brief hospital course: 66 y.o. male with medical history significant of ETOH abuse, cirrhosis, HLD, HTN, CVA presents with altered mental status. Pt is currently obtunded, unable to obtain from pt. Pt out trick or treating with grandchildren. Upon return, pt was drowsy, difficult to arouse. On AM of admit, pt was unable to arouse. Spouse believes pt may have done herion.Spouse reports ongoing etoh abuse. Spouse reports on-going R knee pain in post-op knee   In the ED, pt was found to be hypothermic with temp of 93.9, requiring bair hugger. Pt was given trial of narcan. Pt still encephalopathic. Pt started on broad spec abx. Hospitialist consulted  Assessment and Plan: Sepsis secondary to R knee infection present on admission -Presenting with enephalopathy, hypothermia -CXR clear, UA unremarkable -R knee edematous. R knee xray reviewed, findings worrisome for joint infection in post-op knee -Appreciate input by Orthopedic Surgery. Recs to continue broad spec abx. Pt s/p arthrocentesis with finding of over 11k WBC's. Fluid cx now pos for rare MRSA species -Blood cx neg thus far -Currently continued on vanc - discussed with Orthopedic Surgery. No plan for surgery unless pt has worsening condition per Orthopedics -Appreciate input by ID, recs to continue Vanc for now   Toxic metabolic encephalopathy -Unclear etiology -Presented clinically septic per above -Cont on vanc per above -trial of narcan given in ED without much improvement -Mentation now much improved. Pt conversing appropriately   R knee swelling -Per above, will f/u on knee xray -Per pt's spouse, pt has been complaining of on-going R knee pain since most recent knee surgery ->11k WBC's in fluid, culture pos for rare MRSA -Cont vanc per ID   ETOH cirrhosis, ETOH abuse -Will  cont on CIWA protocol -will need cessation -No drainable ascites on [place]   HTN -BP stable at present   Hypothermia -resolved, did require bair hugger initially -Now resolved      Subjective:Mentation normalized. Pt reports not remembering coming to the hospital or what happened. Pt reports feeling much better and is very eager to go home  Physical Exam: Vitals:   03/01/22 2034 03/02/22 0038 03/02/22 0433 03/02/22 1327  BP: (!) 153/65 (!) 149/61 (!) 165/67 (!) 162/59  Pulse: 70 71 67 68  Resp: '18 16 18 17  '$ Temp: 98.3 F (36.8 C) 98.8 F (37.1 C) 98.7 F (37.1 C) 99.2 F (37.3 C)  TempSrc: Oral Oral Oral Oral  SpO2: 98% 100% 100% 99%  Weight:      Height:       General exam: Conversant, in no acute distress Respiratory system: normal chest rise, clear, no audible wheezing Cardiovascular system: regular rhythm, s1-s2 Gastrointestinal system: Nondistended, nontender, pos BS Central nervous system: No seizures, no tremors Extremities: No cyanosis, no joint deformities Skin: No rashes, no pallor Psychiatry: Affect normal // no auditory hallucinations   Data Reviewed:  Labs reviewed: Na 142, K 3.9, Cr 1.22, Hgb 7.3, WBC 4.3   Family Communication: Pt in room, family not at bedside  Disposition: Status is: Inpatient Remains inpatient appropriate because: Severity of illness  Planned Discharge Destination: Home    Author: Marylu Lund, MD 03/02/2022 4:43 PM  For on call review [email or web address].

## 2022-03-02 NOTE — Progress Notes (Signed)
Subjective:     Patient reports pain as moderate.  Denies fever, chills, N/V, CP, SOB.  Notes that he wants to go home.  Reports that he does not want any more surgery on his knee if possible.  Objective:   VITALS:  Temp:  [98 F (36.7 C)-98.8 F (37.1 C)] 98.7 F (37.1 C) (11/05 0433) Pulse Rate:  [66-71] 67 (11/05 0433) Resp:  [13-21] 18 (11/05 0433) BP: (149-169)/(54-70) 165/67 (11/05 0433) SpO2:  [98 %-100 %] 100 % (11/05 0433)  General: WDWN patient in NAD. Psych:  Appropriate mood and affect. Neuro:  A&O x 3, Moving all extremities, sensation intact to light touch HEENT:  EOMs intact Chest:  Even non-labored respirations Skin:  C/D/I, no rashes or lesions Extremities: warm/dry, moderate edema to R knee, no erythema or echymosis.  No lymphadenopathy. Pulses: Popliteus 2+ MSK:  ROM: TKE, MMT: able to perform quad set, (-) Homan's    LABS Recent Labs    02/28/22 0231 03/01/22 0258 03/02/22 0541  HGB 7.3* 7.1* 7.3*  WBC 5.3 4.3 4.3  PLT 197 176 188   Recent Labs    03/01/22 0258 03/02/22 0541  NA 141 142  K 4.0 3.9  CL 115* 118*  CO2 18* 18*  BUN 17 17  CREATININE 1.19 1.22  GLUCOSE 83 85   No results for input(s): "LABPT", "INR" in the last 72 hours.   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  -Patient seen in rounds for Dr. Alvan Dame - WBAT R LE - Dr. Alvan Dame does not recommend repeat I&D unless condition destabilizes.  - White count is not elevated. - Cultures demonstrate staph aureus - IV ABX per ID.  Currently on vanc  - Therapy as able - Pain management.  Mechele Claude PA-C [facility] Office:  [phone]

## 2022-03-02 NOTE — Evaluation (Signed)
Physical Therapy Evaluation Patient Details Name: Mark [name]. MRN: [id number] DOB: March 16, 1956 Today's Date: 03/02/2022  History of Present Illness  66 y.o. male with medical history significant of ETOH abuse, cirrhosis, HLD, HTN, CVA presents with altered mental status and history of right knee prosthetic joint infection due to MRSA  Patient with history of complicated, chronic PJI status post multiple prior surgeries.  Clinical Impression  Pt admitted with above diagnosis.  Pt currently with functional limitations due to the deficits listed below (see PT Problem List). Pt will benefit from skilled PT to increase their independence and safety with mobility to allow discharge to the venue listed below.  Pt reports wanting to d/c home today so motivated to participate.  Pt ambulated short distance in hallway.  Pt reports sedentary lifestyle and using crutches at baseline.  Pt eager to d/c home and RN notified.  Pt would benefit from HHPT upon d/c.         Recommendations for follow up therapy are one component of a multi-disciplinary discharge planning process, led by the attending physician.  Recommendations may be updated based on patient status, additional functional criteria and insurance authorization.  Follow Up Recommendations Home health PT      Assistance Recommended at Discharge Intermittent Supervision/Assistance  Patient can return home with the following  Help with stairs or ramp for entrance;Assistance with cooking/housework    Equipment Recommendations None recommended by PT  Recommendations for Other Services       Functional Status Assessment Patient has had a recent decline in their functional status and demonstrates the ability to make significant improvements in function in a reasonable and predictable amount of time.     Precautions / Restrictions Precautions Precautions: Fall Restrictions Weight Bearing Restrictions: No      Mobility  Bed  Mobility Overal bed mobility: Needs Assistance Bed Mobility: Supine to Sit, Sit to Supine     Supine to sit: Min guard Sit to supine: Min guard   General bed mobility comments: pt self assists R LE with UEs    Transfers Overall transfer level: Needs assistance Equipment used: Rolling walker (2 wheels) Transfers: Sit to/from Stand Sit to Stand: Min guard           General transfer comment: min/guard for safety, cues for hand placement    Ambulation/Gait Ambulation/Gait assistance: Min guard, +2 safety/equipment Gait Distance (Feet): 40 Feet Assistive device: Rolling walker (2 wheels) Gait Pattern/deviations: Step-through pattern, Decreased stride length, Decreased stance time - right       General Gait Details: verbal cues for posture as pt tends to perform forward trunk lean (states he walks like this from previous back surgery); also reports he uses crutches at home and reports sedentary lifestyle; recliner followed for safety and utilized for one seated rest break due to fatigue  Stairs            Wheelchair Mobility    Modified Rankin (Stroke Patients Only)       Balance Overall balance assessment: Needs assistance         Standing balance support: During functional activity, Reliant on assistive device for balance Standing balance-[name]: Poor                               Pertinent Vitals/Pain Pain Assessment Pain Assessment: Faces Faces Pain Scale: Hurts little more Pain Location: R knee Pain Descriptors / Indicators: Grimacing, Guarding Pain Intervention(s): Repositioned,  Monitored during session    Home Living Family/patient expects to be discharged to:: Private residence Living Arrangements: Spouse/significant other Available Help at Discharge: Family;Available PRN/intermittently Type of Home: House Home Access: Stairs to enter Entrance Stairs-Rails: None Entrance Stairs-Number of Steps: 2   Home Layout: One  level Home Equipment: Conservation officer, nature (2 wheels);BSC/3in1;Grab bars - tub/shower;Shower seat;Crutches Additional Comments: information taken from last visit. Patient reports he was independent prior to admission. Doesn't remember events leading up to hospitliziation.  Reports no stairs at home.    Prior Function Prior Level of Function : Independent/Modified Independent             Mobility Comments: reports using crutches at home       Hand Dominance   Dominant Hand: Right    Extremity/Trunk Assessment        Lower Extremity Assessment Lower Extremity Assessment: Generalized weakness;RLE deficits/detail RLE Deficits / Details: edematous R knee which pt self assists    Cervical / Trunk Assessment Cervical / Trunk Assessment: Normal;Other exceptions Cervical / Trunk Exceptions: pt reports previous back surgeries and performs trunk flexion during mobility  Communication   Communication: No difficulties  Cognition Arousal/Alertness: Awake/alert Behavior During Therapy: WFL for tasks assessed/performed, Flat affect Overall Cognitive Status: No family/caregiver present to determine baseline cognitive functioning                                 General Comments: reports wanting to go home, "has money on a game", follows commands, agreeable to participate (likely motivated by wanting to d/c home today)        General Comments      Exercises     Assessment/Plan    PT Assessment Patient needs continued PT services  PT Problem List Decreased strength;Decreased mobility;Decreased activity tolerance;Decreased balance;Decreased knowledge of use of DME;Pain       PT Treatment Interventions Gait training;DME instruction;Therapeutic exercise;Balance training;Therapeutic activities;Patient/family education;Functional mobility training;Stair training    PT Goals (Current goals can be found in the Care Plan section)  Acute Rehab PT Goals PT Goal Formulation: With  patient Time For Goal Achievement: 03/16/22 Potential to Achieve Goals: Good    Frequency Min 3X/week     Co-evaluation               AM-PAC PT "6 Clicks" Mobility  Outcome Measure Help needed turning from your back to your side while in a flat bed without using bedrails?: A Little Help needed moving from lying on your back to sitting on the side of a flat bed without using bedrails?: A Little Help needed moving to and from a bed to a chair (including a wheelchair)?: A Little Help needed standing up from a chair using your arms (e.g., wheelchair or bedside chair)?: A Little Help needed to walk in hospital room?: A Little Help needed climbing 3-5 steps with a railing? : A Lot 6 Click Score: 17    End of Session Equipment Utilized During Treatment: Gait belt Activity Tolerance: Patient tolerated treatment well Patient left: in bed;with call bell/phone within reach;with bed alarm set Nurse Communication: Mobility status PT Visit Diagnosis: Difficulty in walking, not elsewhere classified (R26.2)    Time: [id number] PT Time Calculation (min) (ACUTE ONLY): 15 min   Charges:   PT Evaluation $PT Eval Low Complexity: 1 Low         Kati PT, DPT Physical Therapist Acute Rehabilitation Services Preferred contact method: Secure  Chat Weekend Pager Only: [phone] Office: [place] 03/02/2022, 11:14 AM

## 2022-03-02 NOTE — Progress Notes (Signed)
   Subjective:    Recheck right knee for chronic infection Pt c/o moderate pain/soreness in the knee this morning Discussed case with Dr. Wyline Copas and Dr. Alvan Dame yesterday Dr. Alvan Dame does not recommend repeat I&D/surgery for his knee unless his condition destabilizes Currently he is awake and alert which is improved 2 days ago Otherwise pt doing fair Patient reports pain as moderate.  Objective:   VITALS:   Vitals:   03/02/22 0038 03/02/22 0433  BP: (!) 149/61 (!) 165/67  Pulse: 71 67  Resp: 16 18  Temp: 98.8 F (37.1 C) 98.7 F (37.1 C)  SpO2: 100% 100%    Right knee: well healed incision Nv intact distally Guarded rom due to pain No signs of open wounds or rashes  LABS Recent Labs    02/28/22 0231 03/01/22 0258 03/02/22 0541  HGB 7.3* 7.1* 7.3*  HCT 23.3* 22.2* 22.3*  WBC 5.3 4.3 4.3  PLT 197 176 188    Recent Labs    02/28/22 0231 03/01/22 0258 03/02/22 0541  NA 141 141 142  K 4.5 4.0 3.9  BUN 25* 17 17  CREATININE 1.43* 1.19 1.22  GLUCOSE 84 83 85     Assessment/Plan:   Right knee chronic infection Recommend continued IV antibiotics and no intention for surgery unless his condition worsens.  Currently he does not have an elevated white count or fever Will continue to monitor his progress Therapy as able Pain management     Kathrynn Speed, [place] is now [facility]  [place] [address], [place], [place], [place] [address] Phone: [phone] [email or web address] Facebook  [employer]

## 2022-03-03 ENCOUNTER — Telehealth: Payer: Self-pay | Admitting: Family Medicine

## 2022-03-03 ENCOUNTER — Inpatient Hospital Stay: Payer: Self-pay

## 2022-03-03 DIAGNOSIS — R400 Somnolence: Secondary | ICD-10-CM | POA: Diagnosis not present

## 2022-03-03 DIAGNOSIS — T68XXXA Hypothermia, initial encounter: Secondary | ICD-10-CM | POA: Diagnosis not present

## 2022-03-03 DIAGNOSIS — N179 Acute kidney failure, unspecified: Secondary | ICD-10-CM | POA: Diagnosis not present

## 2022-03-03 DIAGNOSIS — T8453XD Infection and inflammatory reaction due to internal right knee prosthesis, subsequent encounter: Secondary | ICD-10-CM

## 2022-03-03 LAB — CULTURE, BLOOD (ROUTINE X 2)
Culture: NO GROWTH
Culture: NO GROWTH

## 2022-03-03 LAB — CK: Total CK: 155 U/L (ref 49–397)

## 2022-03-03 MED ORDER — SODIUM CHLORIDE 0.9% FLUSH: 10.0000 mL | Freq: Two times a day (BID) | INTRAVENOUS | Status: DC

## 2022-03-03 MED ORDER — SODIUM CHLORIDE 0.9% FLUSH: 10.0000 mL | INTRAVENOUS | Status: DC | PRN

## 2022-03-03 MED ORDER — CHLORHEXIDINE GLUCONATE CLOTH 2 % EX PADS
6.0000 | MEDICATED_PAD | Freq: Every day | CUTANEOUS | Status: DC
  Administered 2022-03-03: 6 via TOPICAL

## 2022-03-03 MED ORDER — SODIUM CHLORIDE 0.9 % IV SOLN
500.0000 mg | Freq: Every day | INTRAVENOUS | Status: DC
  Administered 2022-03-03: 500 mg via INTRAVENOUS
  Filled 2022-03-03: qty 10

## 2022-03-03 MED ORDER — HEPARIN SOD (PORK) LOCK FLUSH 100 UNIT/ML IV SOLN: 500.0000 [IU] | INTRAVENOUS | Status: DC | PRN

## 2022-03-03 MED ORDER — HEPARIN SOD (PORK) LOCK FLUSH 100 UNIT/ML IV SOLN
250.0000 [IU] | INTRAVENOUS | Status: AC | PRN
  Administered 2022-03-03: 250 [IU]

## 2022-03-03 MED ORDER — DAPTOMYCIN IV (FOR PTA / DISCHARGE USE ONLY): 500.0000 mg | INTRAVENOUS | 0 refills | Status: AC

## 2022-03-03 NOTE — TOC Progression Note (Addendum)
Transition of Care (TOC) - Progression Note    Patient Details  Name: Mark [name]. MRN: [id number] Date of Birth: Jan 09, 1956  Transition of Care [facility]) CM/SW [place], RN Phone Number:[phone]  03/03/2022, 1:03 PM  Clinical Narrative:    Cm at bedside to discuss disposition plan. Patient made aware that he will discharge on home IV antibiotics. Patient states that he has had them before and his daughter will assist in managing home antibiotics. CM offered patient choice for home health. Patient does not have a preference. Home health referral has been called to Angie with Twanna Hy with [facility] is following for home infusion. IV therapy is currently at bedside to place PICC.    1300 CM spoke with daughter Eben Burow who confirms that she has assisted patient in the past with IV abx and she will be able to assist with home infusions again.  [facility] has accepted patient for home health will start care on Wed. MD has been made aware and is ok with delayed start of care. AVS has been updated.   94 Pam with [facility] will do teaching at bedside with daughter. Once that is done patient will be ready for discharge. Nurse has been updated.   Expected Discharge Plan: [place] Barriers to Discharge: Continued Medical Work up  Expected Discharge Plan and Services Expected Discharge Plan: [place] In-house Referral: NA Discharge Planning Services: CM Consult Post Acute Care Choice: [place] arrangements for the past 2 months: Single Family Home                 DME Arranged: N/A DME Agency: NA       HH Arranged: RN, PT [place] Agency: Other - See comment Therapist, music) Date HH Agency Contacted: 03/03/22 Time [place]: 1301 Representative spoke with at [place]: [place] ([place]) Interventions    Readmission Risk Interventions    03/03/2022   12:29 PM 08/03/2020   11:48 AM   Readmission Risk Prevention Plan  Transportation Screening Complete Complete  PCP or Specialist Appt within 3-5 Days Complete   HRI or [place] Complete Complete  Social Work Consult for [place] Planning/Counseling Complete Complete  Palliative Care Screening Not Applicable Not Applicable  Medication Review Press photographer) Referral to Pharmacy Complete

## 2022-03-03 NOTE — Telephone Encounter (Signed)
Wants to know if [name] will continue to be his provider thru his home health, signing orders, etc.

## 2022-03-03 NOTE — Progress Notes (Signed)
Peripherally Inserted Central Catheter Placement  The IV Nurse has discussed with the patient and/or persons authorized to consent for the patient, the purpose of this procedure and the potential benefits and risks involved with this procedure.  The benefits include less needle sticks, lab draws from the catheter, and the patient may be discharged home with the catheter. Risks include, but not limited to, infection, bleeding, blood clot (thrombus formation), and puncture of an artery; nerve damage and irregular heartbeat and possibility to perform a PICC exchange if needed/ordered by physician.  Alternatives to this procedure were also discussed.  Bard Power PICC patient education guide, fact sheet on infection prevention and patient information card has been provided to patient /or left at bedside.    PICC Placement Documentation  PICC Single Lumen 03/03/22 Right Brachial 34 cm 0 cm (Active)  Indication for Insertion or Continuance of Line Home intravenous therapies (PICC only) 03/03/22 1337  Exposed Catheter (cm) 0 cm 03/03/22 1337  Site Assessment Clean, Dry, Intact 03/03/22 1337  Line Status Saline locked;Flushed;Blood return noted 03/03/22 1337  Dressing Type Transparent;Securing device 03/03/22 1337  Dressing Status Antimicrobial disc in place;Clean, Dry, Intact 03/03/22 1337  Safety Lock Not Applicable 16/38/45 3646  Line Care Connections checked and tightened 03/03/22 1337  Line Adjustment (NICU/IV Team Only) No 03/03/22 1337  Dressing Intervention New dressing 03/03/22 1337  Dressing Change Due 03/10/22 03/03/22 [place], [place] 03/03/2022, 1:38 PM

## 2022-03-03 NOTE — Discharge Summary (Signed)
Physician Discharge Summary   Patient: Mark [name]. MRN: [id number] DOB: January 17, 1956  Admit date:     02/26/2022  Discharge date: 03/03/22  Discharge Physician: Marylu Lund   PCP: [name], MD   Recommendations at discharge:   Follow up with PCP in 1-2 weeks  Routine PICC care Please remove PICC after completing IV abx after 04/09/22  Discharge Diagnoses: Principal Problem:   Infection of prosthetic right knee joint ([facility] [place]) Active Problems:   S/P right TKA   CKD (chronic kidney disease) stage 3, GFR 30-59 ml/min ([name])   AKI (acute kidney injury) ([place])   Toxic metabolic encephalopathy  Resolved Problems:   * No resolved hospital problems. *  Hospital Course: 66 y.o. male with medical history significant of ETOH abuse, cirrhosis, HLD, HTN, CVA presents with altered mental status. Pt is currently obtunded, unable to obtain from pt. Pt out trick or treating with grandchildren. Upon return, pt was drowsy, difficult to arouse. On AM of admit, pt was unable to arouse. Spouse believes pt may have done herion.Spouse reports ongoing etoh abuse. Spouse reports on-going R knee pain in post-op knee   In the ED, pt was found to be hypothermic with temp of 93.9, requiring bair hugger. Pt was given trial of narcan. Pt still encephalopathic. Pt started on broad spec abx. Hospitialist consulted  Assessment and Plan: Sepsis secondary to R knee infection present on admission -Presenting with enephalopathy, hypothermia -CXR clear, UA unremarkable -R knee edematous. R knee xray reviewed, findings worrisome for joint infection in post-op knee -Appreciate input by Orthopedic Surgery. Recs to continue broad spec abx. Pt s/p arthrocentesis with finding of over 11k WBC's. Fluid cx now pos for rare MRSA species -Blood cx neg thus far - discussed with Orthopedic Surgery. No plan for surgery unless pt has worsening condition per Orthopedics -Appreciate input by ID, initially was continued on  vancomycin -ID has since recommended completing daptomycin to end 04/09/22 on d/c -PICC placed. Cont routine PICC care, d/c PICC after completing abx   Toxic metabolic encephalopathy -Presented clinically septic per above -Initially continued on vanc per above -trial of narcan given in ED without much improvement -Mentation ultimately normalized with abx   R knee swelling -Per above, will f/u on knee xray -Per pt's spouse, pt has been complaining of on-going R knee pain since most recent knee surgery ->11k WBC's in fluid, culture pos for rare MRSA -Initially on vanc, to complete course of daptomycin on d/c   ETOH cirrhosis, ETOH abuse -Was continued on CIWA -No drainable ascites on [place]   HTN -BP stable at present   Hypothermia -resolved, did require bair hugger initially -Now resolved       Consultants: ID, Orthopedic Surgery Procedures performed:   Disposition: Home Diet recommendation:  Regular diet DISCHARGE MEDICATION: Allergies as of 03/03/2022   No Known Allergies      Medication List     STOP taking these medications    doxycycline 100 MG capsule Commonly known as: VIBRAMYCIN   doxycycline 100 MG tablet Commonly known as: VIBRA-TABS   oxyCODONE 5 MG immediate release tablet Commonly known as: Oxy IR/ROXICODONE   Oxycodone HCl 10 MG Tabs       TAKE these medications    aspirin EC 81 MG tablet Take 81 mg by mouth in the morning and at bedtime. Swallow whole.   daptomycin  IVPB Commonly known as: CUBICIN Inject 500 mg into the vein daily. Indication:  MRSA R Knee PJI First  Dose: Yes Last Day of Therapy:  04/09/2022 Labs - Once weekly:  CBC/D, BMP, and CPK Labs - Every other week:  ESR and CRP Method of administration: IV Push Pull PICC line at the completion of IV therapy  Method of administration may be changed at the discretion of home infusion pharmacist based upon assessment of the patient and/or caregiver's ability to self-administer  the medication ordered.   docusate sodium 100 MG capsule Commonly known as: Colace Take 1 capsule (100 mg total) by mouth 2 (two) times daily.   ferrous sulfate 325 (65 FE) MG tablet Take 1 tablet (325 mg total) by mouth 2 (two) times daily with a meal. Take for two weeks as tolerated.   folic acid 1 MG tablet Commonly known as: FOLVITE TAKE 1 TABLET(1 MG) BY MOUTH DAILY   furosemide 20 MG tablet Commonly known as: LASIX TAKE 1 TABLET(20 MG) BY MOUTH TWICE DAILY What changed: See the new instructions.   methocarbamol 500 MG tablet Commonly known as: ROBAXIN Take 1 tablet (500 mg total) by mouth every 6 (six) hours as needed for muscle spasms.   nadolol 20 MG tablet Commonly known as: CORGARD TAKE 1 TABLET BY MOUTH EVERY DAY   pantoprazole 40 MG tablet Commonly known as: PROTONIX TAKE 1 TABLET BY MOUTH EVERY DAY   polyethylene glycol 17 g packet Commonly known as: MIRALAX / GLYCOLAX Take 17 g by mouth daily.               Discharge Care Instructions  (From admission, onward)           Start     Ordered   03/03/22 0000  Change dressing on IV access line weekly and PRN  (Home infusion instructions - [facility] )        03/03/22 1540            Follow-up Information     [place] [place] Follow up.   Specialty: Home Health Services Why: [facility] formerly [employer]- Your home health has been set up with [employer]. The office will call you with start of service information.Your service is schedules to start Wednesday Nov. 8. If you have any questions please call the number listed above. Contact information: [place] [phone]         [name], MD Follow up in 1 week(s).   Specialty: Family Medicine Why: Hospital follow up Contact information: [place]  [phone]                Discharge Exam: Danley Danker Weights   02/26/22 1057   Weight: 57 kg   General exam: Awake, laying in bed, in nad Respiratory system: Normal respiratory effort, no wheezing Cardiovascular system: regular rate, s1, s2 Gastrointestinal system: Soft, nondistended, positive BS Central nervous system: CN2-12 grossly intact, strength intact Extremities: Perfused, no clubbing Skin: Normal skin turgor, no notable skin lesions seen Psychiatry: Mood normal // no visual hallucinations   Condition at discharge: fair  The results of significant diagnostics from this hospitalization (including imaging, microbiology, ancillary and laboratory) are listed below for reference.   Imaging Studies: [place] EKG SITE RITE  Result Date: 03/03/2022 If Site Rite image not attached, placement could not be confirmed due to current cardiac rhythm.  [place] ASCITES (ABDOMEN LIMITED)  Result Date: 02/27/2022 CLINICAL DATA:  Provided history: Ascites. EXAM: LIMITED ABDOMEN ULTRASOUND FOR ASCITES TECHNIQUE: A limited sonographic survey of the abdomen  was performed in all four quadrants to assess for ascites. COMPARISON:  Abdominal radiograph 05/19/2018. CT abdomen/pelvis 06/17/2017. FINDINGS: Limited sonographic survey of the abdomen performed in all four quadrants to assess for ascites. No significant ascites identified. IMPRESSION: No significant abdominal ascites. Electronically Signed   By: Kellie Simmering D.O.   On: 02/27/2022 11:53   DG Knee 1-2 Views Right  Result Date: 02/26/2022 CLINICAL DATA:  [id number] Pain and swelling of right knee [id number] EXAM: RIGHT KNEE - 1-2 VIEW COMPARISON:  Radiograph 11/08/2020 FINDINGS: Long-stem total knee arthroplasty. Interval bone erosion/resorption adjacent to the medial tibial tray and along the lateral distal femoral metaphysis and lateral femoral condyle. There is periosteal reaction along the patella and medial femoral metaphysis/medial femoral condyle with bony thinning and sclerosis. There is soft tissue swelling along the knee and a  moderate-sized joint effusion. IMPRESSION: Right total knee arthroplasty with findings of prosthetic joint infection. Extensive soft tissue swelling with moderate-sized joint effusion. Electronically Signed   By: Maurine Simmering M.D.   On: 02/26/2022 15:50   CT Cervical Spine Wo Contrast  Result Date: 02/26/2022 CLINICAL DATA:  Trauma, altered mental status EXAM: CT CERVICAL SPINE WITHOUT CONTRAST TECHNIQUE: Multidetector CT imaging of the cervical spine was performed without intravenous contrast. Multiplanar CT image reconstructions were also generated. RADIATION DOSE REDUCTION: This exam was performed according to the departmental dose-optimization program which includes automated exposure control, adjustment of the mA and/or kV according to patient size and/or use of iterative reconstruction technique. COMPARISON:  None Available. FINDINGS: There is patient motion in the images limiting the study. Alignment: Alignment of posterior margins of vertebral bodies is unremarkable. Skull base and vertebrae: No recent fracture is seen. Evaluation for undisplaced fractures is limited due to motion artifacts. Degenerative changes are noted in cervical spine, more so at C6-C7 level. Soft tissues and spinal canal: There is no central spinal stenosis. Disc levels: There is encroachment of neural foramina at C5-C6 and C6-C7 levels, more so on the left side at C6-C7 level. Upper chest: Unremarkable. Other: Arterial calcifications are seen, more so at common carotid bifurcations on both sides. IMPRESSION: Motion limited study. As far as seen, no displaced fracture is seen. Cervical spondylosis with encroachment of neural foramina at C5-C6 and C6-C7 levels, more so on the left side at C6-C7 level. Arteriosclerosis. Electronically Signed   By: Elmer Picker M.D.   On: 02/26/2022 12:58   CT Head Wo Contrast  Result Date: 02/26/2022 CLINICAL DATA:  Altered mental status EXAM: CT HEAD WITHOUT CONTRAST TECHNIQUE: Contiguous  axial images were obtained from the base of the skull through the vertex without intravenous contrast. RADIATION DOSE REDUCTION: This exam was performed according to the departmental dose-optimization program which includes automated exposure control, adjustment of the mA and/or kV according to patient size and/or use of iterative reconstruction technique. COMPARISON:  12/22/2018 FINDINGS: Brain: No acute intracranial findings are seen. There are no signs of bleeding. Ventricles are not dilated. Cortical sulci are prominent. There is decreased density in periventricular white matter. Vascular: Scattered arterial calcifications are seen. Skull: Unremarkable. Sinuses/Orbits: Small air-fluid level is seen in left maxillary sinus. There is mucosal thickening in the ethmoid sinus. Other: None. IMPRESSION: No acute intracranial findings are seen. Atrophy. Small-vessel disease. Chronic ethmoid sinusitis. Small air-fluid level in left maxillary sinus suggests possible mild acute sinusitis. Electronically Signed   By: Elmer Picker M.D.   On: 02/26/2022 12:54   DG Chest Portable 1 View  Result Date: 02/26/2022 CLINICAL DATA:  Altered mental status. EXAM: PORTABLE CHEST 1 VIEW COMPARISON:  Chest x-ray dated October 01, 2018. FINDINGS: The heart size and mediastinal contours are within normal limits. Both lungs are clear. The visualized skeletal structures are unremarkable. IMPRESSION: No active disease. Electronically Signed   By: Titus Dubin M.D.   On: 02/26/2022 12:02    Microbiology: Results for orders placed or performed during the hospital encounter of 02/26/22  Resp Panel by RT-PCR (Flu A&B, Covid) Anterior Nasal Swab     Status: None   Collection Time: 02/26/22 11:20 AM   Specimen: Anterior Nasal Swab  Result Value Ref Range Status   SARS Coronavirus 2 by RT PCR NEGATIVE NEGATIVE Final    Comment: (NOTE) SARS-CoV-2 target nucleic acids are NOT DETECTED.  The SARS-CoV-2 RNA is generally detectable  in upper respiratory specimens during the acute phase of infection. The lowest concentration of SARS-CoV-2 viral copies this assay can detect is 138 copies/mL. A negative result does not preclude SARS-Cov-2 infection and should not be used as the sole basis for treatment or other patient management decisions. A negative result may occur with  improper specimen collection/handling, submission of specimen other than nasopharyngeal swab, presence of viral mutation(s) within the areas targeted by this assay, and inadequate number of viral copies(<138 copies/mL). A negative result must be combined with clinical observations, patient history, and epidemiological information. The expected result is Negative.  Fact Sheet for Patients:  EntrepreneurPulse.com.au  Fact Sheet for Healthcare Providers:  IncredibleEmployment.be  This test is no t yet approved or cleared by the [place] FDA and  has been authorized for detection and/or diagnosis of SARS-CoV-2 by FDA under an Emergency Use Authorization (EUA). This EUA will remain  in effect (meaning this test can be used) for the duration of the COVID-19 declaration under Section 564(b)(1) of the Act, 21 U.S.C.section 360bbb-3(b)(1), unless the authorization is terminated  or revoked sooner.       Influenza A by PCR NEGATIVE NEGATIVE Final   Influenza B by PCR NEGATIVE NEGATIVE Final    Comment: (NOTE) The Xpert Xpress SARS-CoV-2/FLU/RSV plus assay is intended as an aid in the diagnosis of influenza from Nasopharyngeal swab specimens and should not be used as a sole basis for treatment. Nasal washings and aspirates are unacceptable for Xpert Xpress SARS-CoV-2/FLU/RSV testing.  Fact Sheet for Patients: EntrepreneurPulse.com.au  Fact Sheet for Healthcare Providers: IncredibleEmployment.be  This test is not yet approved or cleared by the [place] FDA and has  been authorized for detection and/or diagnosis of SARS-CoV-2 by FDA under an Emergency Use Authorization (EUA). This EUA will remain in effect (meaning this test can be used) for the duration of the COVID-19 declaration under Section 564(b)(1) of the Act, 21 U.S.C. section 360bbb-3(b)(1), unless the authorization is terminated or revoked.  Performed at [facility], [name] [address] [place], [place] [address]   Culture, blood (routine x 2)     Status: None   Collection Time: 02/26/22 11:30 AM   Specimen: BLOOD  Result Value Ref Range Status   Specimen Description   Final    BLOOD BLOOD LEFT FOREARM Performed at [facility] [address] [place], [place] [address]    Special Requests   Final    BOTTLES DRAWN AEROBIC AND ANAEROBIC Blood Culture results may not be optimal due to an inadequate volume of blood received in culture bottles Performed at [facility] [address], [place], [place] [address]    Culture  Final    NO GROWTH 5 DAYS Performed at [facility], [place] [address], [place], [place] [address]    Report Status 03/03/2022 FINAL  Final  Culture, blood (routine x 2)     Status: None   Collection Time: 02/26/22 11:38 AM   Specimen: BLOOD  Result Value Ref Range Status   Specimen Description   Final    BLOOD LEFT ANTECUBITAL Performed at [facility] [address] [place], [place] [address]    Special Requests   Final    BOTTLES DRAWN AEROBIC AND ANAEROBIC Blood Culture results may not be optimal due to an inadequate volume of blood received in culture bottles Performed at [facility] [address], [place], [place] [address]    Culture   Final    NO GROWTH 5 DAYS Performed at [facility], [place] [address] [place], [place] [address]    Report Status 03/03/2022 FINAL  Final  Urine Culture     Status: None   Collection Time: 02/26/22  1:55 PM    Specimen: Urine, Clean Catch  Result Value Ref Range Status   Specimen Description   Final    URINE, CLEAN CATCH Performed at [facility], [place] [address], [place], [place] [address]    Special Requests   Final    NONE Performed at [facility], [place] [address], [place], [place] [address]    Culture   Final    NO GROWTH Performed at [facility], [place] [address], [place], [place] [address]    Report Status 02/27/2022 FINAL  Final  MRSA Next Gen by PCR, Nasal     Status: None   Collection Time: 02/26/22  6:22 PM   Specimen: Nasal Mucosa; Nasal Swab  Result Value Ref Range Status   MRSA by PCR Next Gen NOT DETECTED NOT DETECTED Final    Comment: (NOTE) The GeneXpert MRSA Assay (FDA approved for NASAL specimens only), is one component of a comprehensive MRSA colonization surveillance program. It is not intended to diagnose MRSA infection nor to guide or monitor treatment for MRSA infections. Test performance is not FDA approved in patients less than 36 years old. Performed at [facility], [place] [address] [place], [name] [address]   Body fluid culture w Gram Stain     Status: None   Collection Time: 02/26/22  8:09 PM   Specimen: Synovium; Body Fluid  Result Value Ref Range Status   Specimen Description   Final    SYNOVIAL R KNEE Performed at [facility] [address], [place], [place] [address]    Special Requests   Final    NONE Performed at [facility], [place] [address] [place], [place] [address]    Gram Stain NO WBC SEEN NO ORGANISMS SEEN   Final   Culture   Final    RARE METHICILLIN RESISTANT STAPHYLOCOCCUS AUREUS CRITICAL RESULT CALLED TO, READ BACK BY AND VERIFIED WITH: RN K.HOPE AT 1200 ON 02/28/2022 BY T.SAAD. Performed at [facility], [place] [address] [place], [place] [address]    Report Status 03/02/2022 FINAL  Final   Organism ID, Bacteria METHICILLIN  RESISTANT STAPHYLOCOCCUS AUREUS  Final      Susceptibility   Methicillin resistant staphylococcus aureus - MIC*    CIPROFLOXACIN <=0.5 SENSITIVE Sensitive     ERYTHROMYCIN >=8 RESISTANT Resistant     GENTAMICIN <=0.5 SENSITIVE Sensitive  OXACILLIN >=4 RESISTANT Resistant     TETRACYCLINE 2 SENSITIVE Sensitive     VANCOMYCIN 1 SENSITIVE Sensitive     TRIMETH/SULFA <=10 SENSITIVE Sensitive     CLINDAMYCIN <=0.25 SENSITIVE Sensitive     RIFAMPIN <=0.5 SENSITIVE Sensitive     Inducible Clindamycin NEGATIVE Sensitive     * RARE METHICILLIN RESISTANT STAPHYLOCOCCUS AUREUS    Labs: CBC: Recent Labs  Lab 02/26/22 1120 02/26/22 1502 02/27/22 0304 02/28/22 0231 03/01/22 0258 03/02/22 0541  WBC 4.4 5.4 5.3 5.3 4.3 4.3  NEUTROABS 3.1  --   --   --   --   --   HGB 8.7* 7.6* 7.3* 7.3* 7.1* 7.3*  HCT 27.7* 24.1* 22.9* 23.3* 22.2* 22.3*  MCV 100.4* 100.4* 98.3 99.1 98.7 97.0  PLT 227 203 208 197 176 867   Basic Metabolic Panel: Recent Labs  Lab 02/26/22 1120 02/26/22 1502 02/27/22 0304 02/28/22 0231 03/01/22 0258 03/02/22 0541  NA 135  --  139 141 141 142  K 5.4*  --  4.6 4.5 4.0 3.9  CL 107  --  113* 114* 115* 118*  CO2 21*  --  20* 19* 18* 18*  GLUCOSE 102*  --  85 84 83 85  BUN 36*  --  32* 25* 17 17  CREATININE 1.62* 1.42* 1.38* 1.43* 1.19 1.22  CALCIUM 8.7*  --  8.2* 8.1* 8.4* 8.4*   Liver Function Tests: Recent Labs  Lab 02/26/22 1120 02/27/22 0304 02/28/22 0231 03/01/22 0258 03/02/22 0541  AST _0 ALT _1 ALKPHOS 107 84 81 82 79  BILITOT 0.5 0.8 0.6 0.8 0.8  PROT 7.9 6.5 6.5 6.7 7.2  ALBUMIN 2.9* 2.3* 2.3* 2.3* 2.6*   CBG: No results for input(s): "GLUCAP" in the last 168 hours.  Discharge time spent: less than 30 minutes.  Signed: Marylu Lund, MD [facility] 03/03/2022

## 2022-03-03 NOTE — Progress Notes (Signed)
[facility] for Infectious Disease  Date of Admission:  02/26/2022           Reason for visit: Follow up on PJI  Current antibiotics: Vancomycin   ASSESSMENT:    66 y.o. male admitted with:  #Recurrent right knee prosthetic joint infection due to MRSA Patient with history of complicated, chronic PJI status post multiple prior surgeries.  Followed by Dr. Megan Salon as an outpatient.  Last been seen in May 2023 at which time he was advised to continue doxycycline indefinitely.  He had a missed appointment on 02/06/22 with Dr. Megan Salon.  Seen by orthopedic surgery this admission whom has currently recommended no surgical intervention unless he deteriorates.  Aspirate from the right knee on 11/1 shows 11,005 WBC (87% neutrophils) with MRSA growing in cultures.  Blood cultures at this time are no growth to date.   #Acute kidney injury on CKD 3 Creatinine is stable, creatinine clearance 46.7.  RECOMMENDATIONS:    Place PICC line Discussed antibiotic plan with patient in the setting of not having further surgery.  Hopefully he does okay with this but remains high risk for having worsening despite antibiotics See OPAT note below Will sign off  Diagnosis: Right knee PJI  Culture Result: MRSA  No Known Allergies  OPAT Orders Discharge antibiotics to be given via PICC line Discharge antibiotics: Per pharmacy protocol: Daptomycin  Duration: 6 weeks  End Date: 04/09/22  [facility] Care Per Protocol:  Home health RN for IV administration and teaching; PICC line care and labs.    Labs weekly while on IV antibiotics: _xxx_ CBC with differential __ BMP _xxx_ CMP _xxx_ CRP _xxx_ ESR __ Vancomycin trough _xxx_ CK  _xxx_ Please pull PIC at completion of IV antibiotics __ Please leave PIC in place until doctor has seen patient or been notified  Fax weekly labs to [phone]  Clinic Follow Up Appt: 03/19/22 at 10am with Dr Megan Salon    Principal Problem:    Infection of prosthetic right knee joint ([place]) Active Problems:   S/P right TKA   CKD (chronic kidney disease) stage 3, GFR 30-59 ml/min ([name])   AKI (acute kidney injury) ([place])   Toxic metabolic encephalopathy    MEDICATIONS:    Scheduled Meds:  folic acid  1 mg Oral Daily   heparin  5,000 Units Subcutaneous Q8H   lactulose  20 g Oral TID   multivitamin with minerals  1 tablet Oral Daily   mouth rinse  15 mL Mouth Rinse 4 times per day   thiamine  100 mg Oral Daily   Or   thiamine  100 mg Intravenous Daily   Continuous Infusions:  lactated ringers 100 mL/hr at 03/02/22 2223   vancomycin 750 mg (03/02/22 1207)   PRN Meds:.hydrALAZINE, ondansetron (ZOFRAN) IV, mouth rinse  SUBJECTIVE:   24 hour events:  No acute events PT recommending home health Ortho plans no surgery at this time  Patient reports that he is anxious to get home today.  He states his knee pain is stable.  He emphasizes that he was adherent with doxycycline antibiotic suppression. States he does not want further surgery.  Review of Systems  All other systems reviewed and are negative.     OBJECTIVE:   Blood pressure (!) 165/70, pulse 75, temperature 98.1 F (36.7 C), temperature source Oral, resp. rate 18, height 5' 6" (1.676 m), weight 57 kg, SpO2 96 %. Body mass index is 20.28 kg/m.  Physical Exam Constitutional:  Appearance: Normal appearance.  Eyes:     Extraocular Movements: Extraocular movements intact.     Conjunctiva/sclera: Conjunctivae normal.  Pulmonary:     Effort: Pulmonary effort is normal. No respiratory distress.  Musculoskeletal:     Comments: Right knee with midline incision that is well-healed.  It is swollen.  Not much tenderness.  Skin:    General: Skin is warm and dry.  Neurological:     General: No focal deficit present.     Mental Status: He is alert and oriented to person, place, and time.      Lab Results: Lab Results  Component Value Date   WBC 4.3  03/02/2022   HGB 7.3 (L) 03/02/2022   HCT 22.3 (L) 03/02/2022   MCV 97.0 03/02/2022   PLT 188 03/02/2022    Lab Results  Component Value Date   NA 142 03/02/2022   K 3.9 03/02/2022   CO2 18 (L) 03/02/2022   GLUCOSE 85 03/02/2022   BUN 17 03/02/2022   CREATININE 1.22 03/02/2022   CALCIUM 8.4 (L) 03/02/2022   GFRNONAA >60 03/02/2022   GFRAA 31 (L) 12/24/2018    Lab Results  Component Value Date   ALT 12 03/02/2022   AST 27 03/02/2022   ALKPHOS 79 03/02/2022   BILITOT 0.8 03/02/2022       Component Value Date/Time   CRP 3.6 (H) 11/08/2020 1803       Component Value Date/Time   ESRSEDRATE >140 (H) 11/08/2020 1803     I have reviewed the micro and lab results in Epic.  Imaging: No results found.   Imaging independently reviewed in Epic.    [facility] for Infectious Disease [facility] [phone] pager 03/03/2022, 7:29 AM

## 2022-03-03 NOTE — Care Management Important Message (Signed)
Important Message  Patient Details IM Letter placed in Patients room. Name: Mark [name]. MRN: [id number] Date of Birth: Dec 17, 1955   Medicare Important Message Given:  Yes     Kerin Salen 03/03/2022, 11:36 AM

## 2022-03-03 NOTE — Progress Notes (Signed)
PHARMACY CONSULT NOTE FOR:  OUTPATIENT  PARENTERAL ANTIBIOTIC THERAPY (OPAT)  Indication: MRSA Right prosthetic knee joint infection  Regimen: Daptomycin '500mg'$  IV once daily   End date: December 13th, 2023  IV antibiotic discharge orders are pended. To discharging provider:  please sign these orders via discharge navigator,  Select New Orders & click on the button choice - Manage This Unsigned Work.     Thank you for allowing pharmacy to be a part of this patient's care.  Vicenta Dunning, PharmD  PGY1 Pharmacy Resident

## 2022-03-04 ENCOUNTER — Telehealth: Payer: Self-pay

## 2022-03-04 DIAGNOSIS — R7881 Bacteremia: Secondary | ICD-10-CM | POA: Diagnosis not present

## 2022-03-04 NOTE — Telephone Encounter (Signed)
Transition Care Management Follow-up Telephone Call Date of discharge and from where: [facility] 03-03-22 Dx: infection of prosthetic right knee joint  How have you been since you were released from the hospital? Doing better  Any questions or concerns? No  Items Reviewed: Did the pt receive and understand the discharge instructions provided? Yes  Medications obtained and verified? Yes  Other? No  Any new allergies since your discharge? No  Dietary orders reviewed? Yes Do you have support at home? Yes   Home Care and Equipment/Supplies: Were home health services ordered? yes If so, what is the name of the agency? [facility]   Has the agency set up a time to come to the patient's home? yes Were any new equipment or medical supplies ordered?  No What is the name of the medical supply agency? [facility] Were you able to get the supplies/equipment? not applicable Do you have any questions related to the use of the equipment or supplies? No  Functional Questionnaire: (I = Independent and D = Dependent) ADLs: I  Bathing/Dressing- I  Meal Prep- I  Eating- I  Maintaining continence- I  Transferring/Ambulation- I  Managing Meds- I  Follow up appointments reviewed:  PCP Hospital f/u appt confirmed? Yes  Scheduled to see [name]  on 03-10-22 @ [facility] f/u appt confirmed? Yes Scheduled to see Dr Megan Salon on 03-19-22 @ 10am. Are transportation arrangements needed? No  If their condition worsens, is the pt aware to call PCP or go to the Emergency Dept.? Yes Was the patient provided with contact information for the PCP's office or ED? Yes Was to pt encouraged to call back with questions or concerns? Yes   Juanda Crumble LPN [facility] [phone]

## 2022-03-05 DIAGNOSIS — Z452 Encounter for adjustment and management of vascular access device: Secondary | ICD-10-CM | POA: Diagnosis not present

## 2022-03-05 DIAGNOSIS — A4102 Sepsis due to Methicillin resistant Staphylococcus aureus: Secondary | ICD-10-CM | POA: Diagnosis not present

## 2022-03-05 DIAGNOSIS — T8144XA Sepsis following a procedure, initial encounter: Secondary | ICD-10-CM | POA: Diagnosis not present

## 2022-03-05 DIAGNOSIS — T8454XA Infection and inflammatory reaction due to internal left knee prosthesis, initial encounter: Secondary | ICD-10-CM | POA: Diagnosis not present

## 2022-03-05 DIAGNOSIS — T8453XA Infection and inflammatory reaction due to internal right knee prosthesis, initial encounter: Secondary | ICD-10-CM | POA: Diagnosis not present

## 2022-03-05 DIAGNOSIS — D631 Anemia in chronic kidney disease: Secondary | ICD-10-CM | POA: Diagnosis not present

## 2022-03-05 DIAGNOSIS — K7030 Alcoholic cirrhosis of liver without ascites: Secondary | ICD-10-CM | POA: Diagnosis not present

## 2022-03-05 DIAGNOSIS — I129 Hypertensive chronic kidney disease with stage 1 through stage 4 chronic kidney disease, or unspecified chronic kidney disease: Secondary | ICD-10-CM | POA: Diagnosis not present

## 2022-03-05 DIAGNOSIS — I8500 Esophageal varices without bleeding: Secondary | ICD-10-CM | POA: Diagnosis not present

## 2022-03-05 DIAGNOSIS — D509 Iron deficiency anemia, unspecified: Secondary | ICD-10-CM | POA: Diagnosis not present

## 2022-03-05 DIAGNOSIS — I2510 Atherosclerotic heart disease of native coronary artery without angina pectoris: Secondary | ICD-10-CM | POA: Diagnosis not present

## 2022-03-05 DIAGNOSIS — N183 Chronic kidney disease, stage 3 unspecified: Secondary | ICD-10-CM | POA: Diagnosis not present

## 2022-03-05 DIAGNOSIS — Z792 Long term (current) use of antibiotics: Secondary | ICD-10-CM | POA: Diagnosis not present

## 2022-03-05 DIAGNOSIS — G319 Degenerative disease of nervous system, unspecified: Secondary | ICD-10-CM | POA: Diagnosis not present

## 2022-03-05 DIAGNOSIS — M48061 Spinal stenosis, lumbar region without neurogenic claudication: Secondary | ICD-10-CM | POA: Diagnosis not present

## 2022-03-05 DIAGNOSIS — Z9181 History of falling: Secondary | ICD-10-CM | POA: Diagnosis not present

## 2022-03-05 DIAGNOSIS — F1010 Alcohol abuse, uncomplicated: Secondary | ICD-10-CM | POA: Diagnosis not present

## 2022-03-05 DIAGNOSIS — I249 Acute ischemic heart disease, unspecified: Secondary | ICD-10-CM | POA: Diagnosis not present

## 2022-03-05 DIAGNOSIS — G928 Other toxic encephalopathy: Secondary | ICD-10-CM | POA: Diagnosis not present

## 2022-03-06 ENCOUNTER — Ambulatory Visit: Payer: BC Managed Care – PPO | Admitting: Internal Medicine

## 2022-03-10 ENCOUNTER — Ambulatory Visit (INDEPENDENT_AMBULATORY_CARE_PROVIDER_SITE_OTHER): Payer: Medicare HMO | Admitting: Family Medicine

## 2022-03-10 ENCOUNTER — Encounter: Payer: Self-pay | Admitting: Family Medicine

## 2022-03-10 ENCOUNTER — Telehealth: Payer: Self-pay

## 2022-03-10 VITALS — BP 122/50 | HR 70 | Temp 98.2°F | Wt 152.0 lb

## 2022-03-10 DIAGNOSIS — T8453XD Infection and inflammatory reaction due to internal right knee prosthesis, subsequent encounter: Secondary | ICD-10-CM

## 2022-03-10 DIAGNOSIS — N179 Acute kidney failure, unspecified: Secondary | ICD-10-CM | POA: Diagnosis not present

## 2022-03-10 DIAGNOSIS — R600 Localized edema: Secondary | ICD-10-CM | POA: Diagnosis not present

## 2022-03-10 DIAGNOSIS — N183 Chronic kidney disease, stage 3 unspecified: Secondary | ICD-10-CM | POA: Diagnosis not present

## 2022-03-10 DIAGNOSIS — Z09 Encounter for follow-up examination after completed treatment for conditions other than malignant neoplasm: Secondary | ICD-10-CM

## 2022-03-10 LAB — COMPREHENSIVE METABOLIC PANEL
ALT: 15 U/L (ref 0–53)
AST: 36 U/L (ref 0–37)
Albumin: 3.2 g/dL — ABNORMAL LOW (ref 3.5–5.2)
Alkaline Phosphatase: 75 U/L (ref 39–117)
BUN: 11 mg/dL (ref 6–23)
CO2: 25 mEq/L (ref 19–32)
Calcium: 7.8 mg/dL — ABNORMAL LOW (ref 8.4–10.5)
Chloride: 111 mEq/L (ref 96–112)
Creatinine, Ser: 1.06 mg/dL (ref 0.40–1.50)
GFR: 72.1 mL/min (ref >60.00)
Glucose, Bld: 76 mg/dL (ref 70–99)
Potassium: 4.1 mEq/L (ref 3.5–5.1)
Sodium: 141 mEq/L (ref 135–145)
Total Bilirubin: 0.5 mg/dL (ref 0.2–1.2)
Total Protein: 7.3 g/dL (ref 6.0–8.3)

## 2022-03-10 LAB — CBC WITH DIFFERENTIAL/PLATELET
Basophils Absolute: 0.1 10*3/uL (ref 0.0–0.1)
Basophils Relative: 1.4 % (ref 0.0–3.0)
Eosinophils Absolute: 0.5 10*3/uL (ref 0.0–0.7)
Eosinophils Relative: 12.4 % — ABNORMAL HIGH (ref 0.0–5.0)
HCT: 23.3 % — CL (ref 39.0–52.0)
Hemoglobin: 7.9 g/dL — CL (ref 13.0–17.0)
Lymphocytes Relative: 27.6 % (ref 12.0–46.0)
Lymphs Abs: 1.2 10*3/uL (ref 0.7–4.0)
MCHC: 33.7 g/dL (ref 30.0–36.0)
MCV: 95.2 fl (ref 78.0–100.0)
Monocytes Absolute: 0.6 10*3/uL (ref 0.1–1.0)
Monocytes Relative: 13.4 % — ABNORMAL HIGH (ref 3.0–12.0)
Neutro Abs: 1.9 10*3/uL (ref 1.4–7.7)
Neutrophils Relative %: 45.2 % (ref 43.0–77.0)
Platelets: 158 10*3/uL (ref 150.0–400.0)
RBC: 2.45 Mil/uL — ABNORMAL LOW (ref 4.22–5.81)
RDW: 15 % (ref 11.5–15.5)
WBC: 4.2 10*3/uL (ref 4.0–10.5)

## 2022-03-10 LAB — BRAIN NATRIURETIC PEPTIDE: Pro B Natriuretic peptide (BNP): 542 pg/mL — ABNORMAL HIGH (ref 0.0–100.0)

## 2022-03-10 NOTE — Telephone Encounter (Signed)
CRITICAL VALUE STICKER  CRITICAL VALUE: hemoglobin 7.9                                Hematocrit 23.3  RECEIVER (on-site recipient of call):Tay  DATE & TIME NOTIFIED:  03/10/22 4:06 pm  MESSENGER (representative from lab): Kenney Houseman  MD NOTIFIED: Volanda Napoleon  TIME OF NOTIFICATION: 4:08 pm  RESPONSE:

## 2022-03-10 NOTE — Progress Notes (Signed)
Subjective:    Patient ID: Mark [name]., male    DOB: 01-08-1956, 66 y.o.   MRN: [id number]  Chief Complaint  Patient presents with   Follow-up    Infection of prosthetic right knee joint hospital stay. States the knee is swollen again, started the day after. Has been keeping it elevated.    Patient accompanied by his wife.  HPI Patient was seen today for HFU.  Pt hospitalized 11/1-11/6/23 for due to infection of prosthetic right knee joint. "Hospital Course: 66 y.o. male with medical history significant of ETOH abuse, cirrhosis, HLD, HTN, CVA presents with altered mental status. Pt is currently obtunded, unable to obtain from pt. Pt out trick or treating with grandchildren. Upon return, pt was drowsy, difficult to arouse. On AM of admit, pt was unable to arouse. Spouse believes pt may have done herion.Spouse reports ongoing etoh abuse. Spouse reports on-going R knee pain in post-op knee   In the ED, pt was found to be hypothermic with temp of 93.9, requiring bair hugger. Pt was given trial of narcan. Pt still encephalopathic. Pt started on broad spec abx. Hospitialist consulted    Assessment and Plan: Sepsis secondary to R knee infection present on admission -Presenting with enephalopathy, hypothermia -CXR clear, UA unremarkable -R knee edematous. R knee xray reviewed, findings worrisome for joint infection in post-op knee -Appreciate input by Orthopedic Surgery. Recs to continue broad spec abx. Pt s/p arthrocentesis with finding of over 11k WBC's. Fluid cx now pos for rare MRSA species -Blood cx neg thus far - discussed with Orthopedic Surgery. No plan for surgery unless pt has worsening condition per Orthopedics -Appreciate input by ID, initially was continued on vancomycin -ID has since recommended completing daptomycin to end 04/09/22 on d/c -PICC placed. Cont routine PICC care, d/c PICC after completing abx   Toxic metabolic encephalopathy -Presented clinically septic per  above -Initially continued on vanc per above -trial of narcan given in ED without much improvement -Mentation ultimately normalized with abx   R knee swelling -Per above, will f/u on knee xray -Per pt's spouse, pt has been complaining of on-going R knee pain since most recent knee surgery ->11k WBC's in fluid, culture pos for rare MRSA -Initially on vanc, to complete course of daptomycin on d/c   ETOH cirrhosis, ETOH abuse -Was continued on CIWA -No drainable ascites on [place]   HTN -BP stable at present   Hypothermia -resolved, did require bair hugger initially -Now resolved"  Patient states appetite is improving.  Patient endorses bilateral LE edema to mid thigh.  Patient inquires if he is supposed to continue taking doxycycline.  Patient endorses being on feet moving around most of the day.  Per patient's wife he had an episode of paranoia 2 nights ago at dinner.  She was concerned that his medication may be causing symptoms so she held furosemide.  Past Medical History:  Diagnosis Date   Alcohol abuse    Anemia    Arthritis    Ascites    Cirrhosis ([place])    Coffee ground emesis    Dehydration 06/17/2017   Febrile illness    GERD (gastroesophageal reflux disease)    Heart murmur    History of blood transfusion    Hyperlipidemia    Hypertension    Leg swelling    Myocardial infarction ([place]) 2012   Preop cardiovascular exam 04/14/2013   Sepsis ([place]) 06/17/2017   Septic shock ([place]) 06/18/2017   SIRS (systemic inflammatory response syndrome) (  [place]) 07/11/2017   Stroke ([place]) 10/2009   TIA   Thrombocytopenia ([name])     No Known Allergies  ROS General: Denies fever, chills, night sweats, changes in weight, changes in appetite HEENT: Denies headaches, ear pain, changes in vision, rhinorrhea, sore throat CV: Denies CP, palpitations, SOB, orthopnea  + bilateral LE edema Pulm: Denies SOB, cough, wheezing GI: Denies abdominal pain, nausea, vomiting, diarrhea,  constipation GU: Denies dysuria, hematuria, frequency Msk: Denies muscle cramps, joint pains +back pain Neuro: Denies weakness, numbness, tingling Skin: Denies rashes, bruising Psych: Denies depression, anxiety, hallucinations      Objective:    Blood pressure (!) 122/50, pulse 70, temperature 98.2 F (36.8 C), temperature source Oral, weight 152 lb (68.9 kg), SpO2 96 %.  Gen. Pleasant, well-nourished, in no distress, normal affect   HEENT: [place]/AT, face symmetric, conjunctiva clear, no scleral icterus, PERRLA, EOMI, nares patent without drainage Lungs: no accessory muscle use, CTAB, no wheezes or rales Cardiovascular: RRR, no m/r/g, bilateral pitting edema to mid thigh, 1+ at right knee and trace in lower LE. Musculoskeletal: No deformities, no cyanosis or clubbing, normal tone Neuro:  A&Ox3, CN II-XII intact, using crutches to aid with ambulation Skin:  Warm, no lesions/ rash   Wt Readings from Last 3 Encounters:  03/10/22 152 lb (68.9 kg)  02/26/22 125 lb 10.6 oz (57 kg)  09/05/21 127 lb (57.6 kg)    Lab Results  Component Value Date   WBC 4.3 03/02/2022   HGB 7.3 (L) 03/02/2022   HCT 22.3 (L) 03/02/2022   PLT 188 03/02/2022   GLUCOSE 85 03/02/2022   CHOL 84 12/01/2013   TRIG 58 12/01/2013   HDL 26 (L) 12/01/2013   LDLCALC 46 12/01/2013   ALT 12 03/02/2022   AST 27 03/02/2022   NA 142 03/02/2022   K 3.9 03/02/2022   CL 118 (H) 03/02/2022   CREATININE 1.22 03/02/2022   BUN 17 03/02/2022   CO2 18 (L) 03/02/2022   TSH 0.806 12/23/2018   INR 1.3 (H) 11/01/2020   HGBA1C  11/01/2009    5.1 (NOTE)                                                                       According to the ADA Clinical Practice Recommendations for 2011, when HbA1c is used as a screening test:   >=6.5%   Diagnostic of Diabetes Mellitus           (if abnormal result  is confirmed)  5.7-6.4%   Increased risk of developing Diabetes Mellitus  References:Diagnosis and Classification of Diabetes  [place],Diabetes ZOXW,9604,54(UJWJX 1):S62-S69 and Standards of Medical Care in         Diabetes - 2011,Diabetes Care,2011,34  (Suppl 1):S11-S61.    Assessment/Plan:  Hospital discharge follow-up -TCM phone call made and reviewed, hospital notes, labs, etc. reviewed.  Infection associated with internal right knee prosthesis, subsequent encounter --Continue IV antibiotics daptomycin via PICC line until 04/09/2022 -Continue follow-up with ID and Ortho -Given strict precautions  - Plan: CBC with Differential/Platelet  Bilateral lower extremity edema -Increasing -Discussed the importance of taking Lasix 20 mg twice daily -Patient also encouraged to elevate LEs when sitting -For continued or worsening edema in the next few days will  need increasing Lasix versus hospitalization. - Plan: CMP, Brain Natriuretic Peptide  Acute renal failure superimposed on stage 3 chronic kidney disease, unspecified acute renal failure type, unspecified whether stage 3a or 3b CKD ([place])  - Plan: CMP  Hypocalcemia  - Plan: CMP  F/u.  Grier Mitts, MD

## 2022-03-10 NOTE — Patient Instructions (Addendum)
Labs were ordered to recheck your kidney function.  Do not forget to make sure you have an appointment to see the orthopedist to follow-up on your knee as well as with ID in regards to PICC line and antibiotics.

## 2022-03-11 DIAGNOSIS — A4102 Sepsis due to Methicillin resistant Staphylococcus aureus: Secondary | ICD-10-CM | POA: Diagnosis not present

## 2022-03-11 DIAGNOSIS — Z9181 History of falling: Secondary | ICD-10-CM | POA: Diagnosis not present

## 2022-03-11 DIAGNOSIS — D509 Iron deficiency anemia, unspecified: Secondary | ICD-10-CM | POA: Diagnosis not present

## 2022-03-11 DIAGNOSIS — M48061 Spinal stenosis, lumbar region without neurogenic claudication: Secondary | ICD-10-CM | POA: Diagnosis not present

## 2022-03-11 DIAGNOSIS — Z452 Encounter for adjustment and management of vascular access device: Secondary | ICD-10-CM | POA: Diagnosis not present

## 2022-03-11 DIAGNOSIS — I129 Hypertensive chronic kidney disease with stage 1 through stage 4 chronic kidney disease, or unspecified chronic kidney disease: Secondary | ICD-10-CM | POA: Diagnosis not present

## 2022-03-11 DIAGNOSIS — T8453XA Infection and inflammatory reaction due to internal right knee prosthesis, initial encounter: Secondary | ICD-10-CM | POA: Diagnosis not present

## 2022-03-11 DIAGNOSIS — D631 Anemia in chronic kidney disease: Secondary | ICD-10-CM | POA: Diagnosis not present

## 2022-03-11 DIAGNOSIS — Z792 Long term (current) use of antibiotics: Secondary | ICD-10-CM | POA: Diagnosis not present

## 2022-03-11 DIAGNOSIS — F1010 Alcohol abuse, uncomplicated: Secondary | ICD-10-CM | POA: Diagnosis not present

## 2022-03-11 DIAGNOSIS — G928 Other toxic encephalopathy: Secondary | ICD-10-CM | POA: Diagnosis not present

## 2022-03-11 DIAGNOSIS — G319 Degenerative disease of nervous system, unspecified: Secondary | ICD-10-CM | POA: Diagnosis not present

## 2022-03-11 DIAGNOSIS — I249 Acute ischemic heart disease, unspecified: Secondary | ICD-10-CM | POA: Diagnosis not present

## 2022-03-11 DIAGNOSIS — T8454XA Infection and inflammatory reaction due to internal left knee prosthesis, initial encounter: Secondary | ICD-10-CM | POA: Diagnosis not present

## 2022-03-11 DIAGNOSIS — N183 Chronic kidney disease, stage 3 unspecified: Secondary | ICD-10-CM | POA: Diagnosis not present

## 2022-03-11 DIAGNOSIS — I2510 Atherosclerotic heart disease of native coronary artery without angina pectoris: Secondary | ICD-10-CM | POA: Diagnosis not present

## 2022-03-11 DIAGNOSIS — T8144XA Sepsis following a procedure, initial encounter: Secondary | ICD-10-CM | POA: Diagnosis not present

## 2022-03-11 DIAGNOSIS — I8500 Esophageal varices without bleeding: Secondary | ICD-10-CM | POA: Diagnosis not present

## 2022-03-11 DIAGNOSIS — K7030 Alcoholic cirrhosis of liver without ascites: Secondary | ICD-10-CM | POA: Diagnosis not present

## 2022-03-12 DIAGNOSIS — R7881 Bacteremia: Secondary | ICD-10-CM | POA: Diagnosis not present

## 2022-03-17 DIAGNOSIS — D631 Anemia in chronic kidney disease: Secondary | ICD-10-CM | POA: Diagnosis not present

## 2022-03-17 DIAGNOSIS — N183 Chronic kidney disease, stage 3 unspecified: Secondary | ICD-10-CM | POA: Diagnosis not present

## 2022-03-17 DIAGNOSIS — K7030 Alcoholic cirrhosis of liver without ascites: Secondary | ICD-10-CM | POA: Diagnosis not present

## 2022-03-17 DIAGNOSIS — Z792 Long term (current) use of antibiotics: Secondary | ICD-10-CM | POA: Diagnosis not present

## 2022-03-17 DIAGNOSIS — F1010 Alcohol abuse, uncomplicated: Secondary | ICD-10-CM | POA: Diagnosis not present

## 2022-03-17 DIAGNOSIS — I2510 Atherosclerotic heart disease of native coronary artery without angina pectoris: Secondary | ICD-10-CM | POA: Diagnosis not present

## 2022-03-17 DIAGNOSIS — G928 Other toxic encephalopathy: Secondary | ICD-10-CM | POA: Diagnosis not present

## 2022-03-17 DIAGNOSIS — A4102 Sepsis due to Methicillin resistant Staphylococcus aureus: Secondary | ICD-10-CM | POA: Diagnosis not present

## 2022-03-17 DIAGNOSIS — T8144XA Sepsis following a procedure, initial encounter: Secondary | ICD-10-CM | POA: Diagnosis not present

## 2022-03-17 DIAGNOSIS — T8453XA Infection and inflammatory reaction due to internal right knee prosthesis, initial encounter: Secondary | ICD-10-CM | POA: Diagnosis not present

## 2022-03-17 DIAGNOSIS — G319 Degenerative disease of nervous system, unspecified: Secondary | ICD-10-CM | POA: Diagnosis not present

## 2022-03-17 DIAGNOSIS — I8500 Esophageal varices without bleeding: Secondary | ICD-10-CM | POA: Diagnosis not present

## 2022-03-17 DIAGNOSIS — I129 Hypertensive chronic kidney disease with stage 1 through stage 4 chronic kidney disease, or unspecified chronic kidney disease: Secondary | ICD-10-CM | POA: Diagnosis not present

## 2022-03-17 DIAGNOSIS — Z9181 History of falling: Secondary | ICD-10-CM | POA: Diagnosis not present

## 2022-03-17 DIAGNOSIS — I249 Acute ischemic heart disease, unspecified: Secondary | ICD-10-CM | POA: Diagnosis not present

## 2022-03-17 DIAGNOSIS — Z452 Encounter for adjustment and management of vascular access device: Secondary | ICD-10-CM | POA: Diagnosis not present

## 2022-03-17 DIAGNOSIS — D509 Iron deficiency anemia, unspecified: Secondary | ICD-10-CM | POA: Diagnosis not present

## 2022-03-17 DIAGNOSIS — M48061 Spinal stenosis, lumbar region without neurogenic claudication: Secondary | ICD-10-CM | POA: Diagnosis not present

## 2022-03-18 DIAGNOSIS — T8453XA Infection and inflammatory reaction due to internal right knee prosthesis, initial encounter: Secondary | ICD-10-CM | POA: Diagnosis not present

## 2022-03-18 DIAGNOSIS — D509 Iron deficiency anemia, unspecified: Secondary | ICD-10-CM | POA: Diagnosis not present

## 2022-03-18 DIAGNOSIS — I2510 Atherosclerotic heart disease of native coronary artery without angina pectoris: Secondary | ICD-10-CM | POA: Diagnosis not present

## 2022-03-18 DIAGNOSIS — F1010 Alcohol abuse, uncomplicated: Secondary | ICD-10-CM | POA: Diagnosis not present

## 2022-03-18 DIAGNOSIS — T8144XA Sepsis following a procedure, initial encounter: Secondary | ICD-10-CM | POA: Diagnosis not present

## 2022-03-18 DIAGNOSIS — Z452 Encounter for adjustment and management of vascular access device: Secondary | ICD-10-CM | POA: Diagnosis not present

## 2022-03-18 DIAGNOSIS — Z792 Long term (current) use of antibiotics: Secondary | ICD-10-CM | POA: Diagnosis not present

## 2022-03-18 DIAGNOSIS — G928 Other toxic encephalopathy: Secondary | ICD-10-CM | POA: Diagnosis not present

## 2022-03-18 DIAGNOSIS — I8500 Esophageal varices without bleeding: Secondary | ICD-10-CM | POA: Diagnosis not present

## 2022-03-18 DIAGNOSIS — N183 Chronic kidney disease, stage 3 unspecified: Secondary | ICD-10-CM | POA: Diagnosis not present

## 2022-03-18 DIAGNOSIS — A4102 Sepsis due to Methicillin resistant Staphylococcus aureus: Secondary | ICD-10-CM | POA: Diagnosis not present

## 2022-03-18 DIAGNOSIS — I129 Hypertensive chronic kidney disease with stage 1 through stage 4 chronic kidney disease, or unspecified chronic kidney disease: Secondary | ICD-10-CM | POA: Diagnosis not present

## 2022-03-18 DIAGNOSIS — M48061 Spinal stenosis, lumbar region without neurogenic claudication: Secondary | ICD-10-CM | POA: Diagnosis not present

## 2022-03-18 DIAGNOSIS — K7030 Alcoholic cirrhosis of liver without ascites: Secondary | ICD-10-CM | POA: Diagnosis not present

## 2022-03-18 DIAGNOSIS — D631 Anemia in chronic kidney disease: Secondary | ICD-10-CM | POA: Diagnosis not present

## 2022-03-18 DIAGNOSIS — I249 Acute ischemic heart disease, unspecified: Secondary | ICD-10-CM | POA: Diagnosis not present

## 2022-03-18 DIAGNOSIS — G319 Degenerative disease of nervous system, unspecified: Secondary | ICD-10-CM | POA: Diagnosis not present

## 2022-03-18 DIAGNOSIS — Z9181 History of falling: Secondary | ICD-10-CM | POA: Diagnosis not present

## 2022-03-19 ENCOUNTER — Ambulatory Visit (INDEPENDENT_AMBULATORY_CARE_PROVIDER_SITE_OTHER): Payer: Medicare HMO | Admitting: Internal Medicine

## 2022-03-19 ENCOUNTER — Encounter: Payer: Self-pay | Admitting: Internal Medicine

## 2022-03-19 ENCOUNTER — Other Ambulatory Visit: Payer: Self-pay

## 2022-03-19 VITALS — BP 151/67 | HR 91 | Wt 149.0 lb

## 2022-03-19 DIAGNOSIS — T8453XD Infection and inflammatory reaction due to internal right knee prosthesis, subsequent encounter: Secondary | ICD-10-CM | POA: Diagnosis not present

## 2022-03-19 DIAGNOSIS — R7881 Bacteremia: Secondary | ICD-10-CM | POA: Diagnosis not present

## 2022-03-19 NOTE — Progress Notes (Signed)
Patient reported leaking from his PICC line. Flushed line with normal saline and observed leaking where neutral pressure cap connects to catheter. Replaced neutral pressure cap, tightened connection and flushed line, no leaking observed. Line clamped. Patient tolerated well. Advised patient to notify his home health nurse if line leaks again.   Michel Bickers, MD           [facility]  Patient Active Problem List   Diagnosis Date Noted   MRSA bacteremia 09/21/2018    Priority: High   Infection of prosthetic right knee joint ([place]) 09/20/2018    Priority: High   Epidural abscess 09/20/2018    Priority: High   S/P right TKA 05/02/2013    Priority: High   Toxic metabolic encephalopathy 37/16/9678   Septic joint of right knee joint ([place]) 11/08/2020   S/P right TKA reimplantation 08/02/2020   Infection of total left knee replacement ([place]) 03/20/2020   Cirrhosis of liver without ascites ([place])    AKI (acute kidney injury) ([place])    Bacteremia    Spinal stenosis of lumbar region without neurogenic claudication    Aspiration pneumonia ([place]) 09/24/2018   Abscess in epidural space of lumbar spine    Anemia of chronic disease 09/20/2018   Hypoalbuminemia 09/20/2018   Hypoglycemia without diagnosis of diabetes mellitus 09/20/2018   Hyperkalemia 05/18/2018   Edentulous 05/11/2018   Pancytopenia ([place]) 05/10/2018   CAD (coronary artery disease) 05/10/2018   Hepatic encephalopathy ([place]) 05/10/2018   Alcohol abuse 05/10/2018   GERD (gastroesophageal reflux disease) 05/10/2018   Duodenal ulcer    Renal failure    Acute on chronic anemia    Hypotension 06/17/2017   Hyponatremia 06/17/2017   Leg edema, right 93/81/0175   Acute metabolic encephalopathy 02/19/8526   Anemia, iron deficiency    Benign neoplasm of ascending colon    Hemorrhoids    Portal hypertensive gastropathy ([name])    Gastritis and gastroduodenitis    Esophageal varices without bleeding ([place])     H/O: CVA (cerebrovascular accident) 12/01/2013   ACS (acute coronary syndrome) ([place]) 12/01/2013   Anasarca 12/01/2013   Polysubstance abuse ([place]) 12/01/2013   CKD (chronic kidney disease) stage 3, GFR 30-59 ml/min ([place]) 12/01/2013   Murmur 04/14/2013   Right inguinal hernia 12/26/2010   Cirrhosis, alcoholic ([place]) 78/24/2353    Patient's Medications  New Prescriptions   No medications on file  Previous Medications   ASPIRIN EC 81 MG TABLET    Take 81 mg by mouth in the morning and at bedtime. Swallow whole.   DAPTOMYCIN (CUBICIN) IVPB    Inject 500 mg into the vein daily. Indication:  MRSA R Knee PJI First Dose: Yes Last Day of Therapy:  04/09/2022 Labs - Once weekly:  CBC/D, BMP, and CPK Labs - Every other week:  ESR and CRP Method of administration: IV Push Pull PICC line at the completion of IV therapy  Method of administration may be changed at the discretion of home infusion pharmacist based upon assessment of the patient and/or caregiver's ability to self-administer the medication ordered.   DOCUSATE SODIUM (COLACE) 100 MG CAPSULE    Take 1 capsule (100 mg total) by mouth 2 (two) times daily.   FERROUS SULFATE 325 (65 FE) MG TABLET    Take 1 tablet (325 mg total) by mouth 2 (two) times daily with a meal. Take for two weeks as tolerated.   FOLIC ACID (FOLVITE) 1 MG TABLET    TAKE 1 TABLET(1 MG) BY MOUTH  DAILY   FUROSEMIDE (LASIX) 20 MG TABLET    TAKE 1 TABLET(20 MG) BY MOUTH TWICE DAILY   METHOCARBAMOL (ROBAXIN) 500 MG TABLET    Take 1 tablet (500 mg total) by mouth every 6 (six) hours as needed for muscle spasms.   NADOLOL (CORGARD) 20 MG TABLET    TAKE 1 TABLET BY MOUTH EVERY DAY   PANTOPRAZOLE (PROTONIX) 40 MG TABLET    TAKE 1 TABLET BY MOUTH EVERY DAY   POLYETHYLENE GLYCOL (MIRALAX / GLYCOLAX) 17 G PACKET    Take 17 g by mouth daily.  Modified Medications   No medications on file  Discontinued Medications   No medications on file    Subjective: Mark [name] is in for his  routine hospital follow-up visit.  He has had recurrent bacteremia.  He had group A strep bacteremia in February 2019, MSSA bacteremia in January 2020 and MRSA bacteremia in May 2020.  His last bacteremia was complicated by thoracic spine infection.  He was supposed to be taking chronic doxycycline ever since that hospitalization.  However, he stopped taking it sometime late 2020.     He began having increased pain, swelling and "popping" in his prosthetic right knee last.  He thinks that this started after he quit doxycycline.  He cannot recall who replaced his knee about 8 years ago.  He saw Dr. Lindwood Qua who told him that the prosthesis was infected and loose.  He said that fluid was drained off of the knee.  It was reported the cultures grew MRSA susceptible to doxycycline and daptomycin.  He was admitted to the hospital in late November 2021 and underwent resection arthroplasty and spacer placement.  No operative cultures were obtained. He has chronic renal insufficiency. My partner discharged him on IV daptomycin and he completed 8 weeks of therapy on 05/02/2019 before switching back to chronic, suppressive oral doxycycline.    Unfortunately he stopped taking the doxycycline again.  His wife says that he was off about 3 weeks before he started having recurrent pain and swelling in his right prosthetic knee.  He was hospitalized again and underwent incision and drainage with polyexchange on 11/13/2020.  Cultures grew MRSA again which was still susceptible to doxycycline.  He was treated with IV daptomycin before switching back to chronic, suppressive doxycycline.  He underwent revision arthroplasty of his right knee in April 2022.  He again had a relapse of his MRSA infection and underwent incision and drainage with poly exchange in July 2022.  He says that he has not been missing doses of his doxycycline but he began to have increased pain swelling and warmth in his knee leading to repeat hospitalization  on 02/26/2022.  Aspiration of his knee grew MRSA again that was still susceptible to doxycycline.  No surgery was performed.  A PICC was placed and he was started back on daptomycin.  He says that his knee is feeling a little better but still very swollen.  He has not had any problems tolerating his PICC or daptomycin.  Review of Systems: Review of Systems  Constitutional:  Negative for chills, diaphoresis and fever.  Gastrointestinal:  Negative for abdominal pain, diarrhea, nausea and vomiting.  Musculoskeletal:  Positive for back pain and joint pain.    Past Medical History:  Diagnosis Date   Alcohol abuse    Anemia    Arthritis    Ascites    Cirrhosis ([place])    Coffee ground emesis    Dehydration 06/17/2017  Febrile illness    GERD (gastroesophageal reflux disease)    Heart murmur    History of blood transfusion    Hyperlipidemia    Hypertension    Leg swelling    Myocardial infarction ([place]) 2012   Preop cardiovascular exam 04/14/2013   Sepsis ([place]) 06/17/2017   Septic shock ([place]) 06/18/2017   SIRS (systemic inflammatory response syndrome) ([name]) 07/11/2017   Stroke ([place]) 10/2009   TIA   Thrombocytopenia ([place])     Social History   Tobacco Use   Smoking status: Former    Packs/day: 0.50    Years: 10.00    Total pack years: 5.00    Types: Cigarettes    Quit date: 01/05/2016    Years since quitting: 6.2   Smokeless tobacco: Never   Tobacco comments:    4 cigarettes a day  Vaping Use   Vaping Use: Never used  Substance Use Topics   Alcohol use: Yes    Comment: 2-3 beers/day   Drug use: Yes    Types: Marijuana    Family History  Problem Relation Age of Onset   Hypertension Father    Diabetes Father    Dementia Mother    Lupus Sister     No Known Allergies  Objective: Vitals:   03/19/22 0934  BP: (!) 151/67  Pulse: 91  SpO2: 95%  Weight: 149 lb (67.6 kg)   Body mass index is 24.05 kg/m.  Physical Exam Constitutional:      Comments: He is  talkative and pleasant.    Cardiovascular:     Rate and Rhythm: Normal rate.  Pulmonary:     Effort: Pulmonary effort is normal.  Musculoskeletal:     Comments: His right knee incision has healed nicely.  He has diffuse unchanged swelling of his knee.  His knee is warm to the touch.   Skin:    Comments: His right arm PICC site looks normal.  Psychiatric:        Mood and Affect: Mood normal.     Lab Results Sed Rate  Date Value  11/08/2020 >140 mm/hr (H)  06/12/2020 43 mm/h (H)  03/21/2020 90 mm/hr (H)   CRP  Date Value  11/08/2020 3.6 mg/dL (H)  06/12/2020 1.6 mg/L  03/21/2020 0.6 mg/dL      Problem List Items Addressed This Visit       High   Infection of prosthetic right knee joint ([place]) - Primary    He has chronic right prosthetic knee infection that could only be cured with removal of all hardware.  I will continue his daptomycin through 04/09/2022 then consider options for chronic suppressive therapy.        Michel Bickers, MD [facility] for [facility] [phone] pager   [phone] cell 03/19/2022, 10:54 AM

## 2022-03-19 NOTE — Assessment & Plan Note (Signed)
He has chronic right prosthetic knee infection that could only be cured with removal of all hardware.  I will continue his daptomycin through 04/09/2022 then consider options for chronic suppressive therapy.

## 2022-03-25 DIAGNOSIS — M48061 Spinal stenosis, lumbar region without neurogenic claudication: Secondary | ICD-10-CM | POA: Diagnosis not present

## 2022-03-25 DIAGNOSIS — Z792 Long term (current) use of antibiotics: Secondary | ICD-10-CM | POA: Diagnosis not present

## 2022-03-25 DIAGNOSIS — I249 Acute ischemic heart disease, unspecified: Secondary | ICD-10-CM | POA: Diagnosis not present

## 2022-03-25 DIAGNOSIS — G319 Degenerative disease of nervous system, unspecified: Secondary | ICD-10-CM | POA: Diagnosis not present

## 2022-03-25 DIAGNOSIS — D509 Iron deficiency anemia, unspecified: Secondary | ICD-10-CM | POA: Diagnosis not present

## 2022-03-25 DIAGNOSIS — F1010 Alcohol abuse, uncomplicated: Secondary | ICD-10-CM | POA: Diagnosis not present

## 2022-03-25 DIAGNOSIS — N183 Chronic kidney disease, stage 3 unspecified: Secondary | ICD-10-CM | POA: Diagnosis not present

## 2022-03-25 DIAGNOSIS — G928 Other toxic encephalopathy: Secondary | ICD-10-CM | POA: Diagnosis not present

## 2022-03-25 DIAGNOSIS — T8144XA Sepsis following a procedure, initial encounter: Secondary | ICD-10-CM | POA: Diagnosis not present

## 2022-03-25 DIAGNOSIS — D631 Anemia in chronic kidney disease: Secondary | ICD-10-CM | POA: Diagnosis not present

## 2022-03-25 DIAGNOSIS — T8453XA Infection and inflammatory reaction due to internal right knee prosthesis, initial encounter: Secondary | ICD-10-CM | POA: Diagnosis not present

## 2022-03-25 DIAGNOSIS — I8500 Esophageal varices without bleeding: Secondary | ICD-10-CM | POA: Diagnosis not present

## 2022-03-25 DIAGNOSIS — A4102 Sepsis due to Methicillin resistant Staphylococcus aureus: Secondary | ICD-10-CM | POA: Diagnosis not present

## 2022-03-25 DIAGNOSIS — I129 Hypertensive chronic kidney disease with stage 1 through stage 4 chronic kidney disease, or unspecified chronic kidney disease: Secondary | ICD-10-CM | POA: Diagnosis not present

## 2022-03-25 DIAGNOSIS — Z9181 History of falling: Secondary | ICD-10-CM | POA: Diagnosis not present

## 2022-03-25 DIAGNOSIS — I2510 Atherosclerotic heart disease of native coronary artery without angina pectoris: Secondary | ICD-10-CM | POA: Diagnosis not present

## 2022-03-25 DIAGNOSIS — K7030 Alcoholic cirrhosis of liver without ascites: Secondary | ICD-10-CM | POA: Diagnosis not present

## 2022-03-25 DIAGNOSIS — Z452 Encounter for adjustment and management of vascular access device: Secondary | ICD-10-CM | POA: Diagnosis not present

## 2022-03-26 DIAGNOSIS — R7881 Bacteremia: Secondary | ICD-10-CM | POA: Diagnosis not present

## 2022-04-01 DIAGNOSIS — A4102 Sepsis due to Methicillin resistant Staphylococcus aureus: Secondary | ICD-10-CM | POA: Diagnosis not present

## 2022-04-01 DIAGNOSIS — D631 Anemia in chronic kidney disease: Secondary | ICD-10-CM | POA: Diagnosis not present

## 2022-04-01 DIAGNOSIS — T8144XA Sepsis following a procedure, initial encounter: Secondary | ICD-10-CM | POA: Diagnosis not present

## 2022-04-01 DIAGNOSIS — I2510 Atherosclerotic heart disease of native coronary artery without angina pectoris: Secondary | ICD-10-CM | POA: Diagnosis not present

## 2022-04-01 DIAGNOSIS — F1010 Alcohol abuse, uncomplicated: Secondary | ICD-10-CM | POA: Diagnosis not present

## 2022-04-01 DIAGNOSIS — D509 Iron deficiency anemia, unspecified: Secondary | ICD-10-CM | POA: Diagnosis not present

## 2022-04-01 DIAGNOSIS — Z9181 History of falling: Secondary | ICD-10-CM | POA: Diagnosis not present

## 2022-04-01 DIAGNOSIS — K7030 Alcoholic cirrhosis of liver without ascites: Secondary | ICD-10-CM | POA: Diagnosis not present

## 2022-04-01 DIAGNOSIS — I129 Hypertensive chronic kidney disease with stage 1 through stage 4 chronic kidney disease, or unspecified chronic kidney disease: Secondary | ICD-10-CM | POA: Diagnosis not present

## 2022-04-01 DIAGNOSIS — N183 Chronic kidney disease, stage 3 unspecified: Secondary | ICD-10-CM | POA: Diagnosis not present

## 2022-04-01 DIAGNOSIS — T8453XA Infection and inflammatory reaction due to internal right knee prosthesis, initial encounter: Secondary | ICD-10-CM | POA: Diagnosis not present

## 2022-04-01 DIAGNOSIS — Z792 Long term (current) use of antibiotics: Secondary | ICD-10-CM | POA: Diagnosis not present

## 2022-04-01 DIAGNOSIS — G928 Other toxic encephalopathy: Secondary | ICD-10-CM | POA: Diagnosis not present

## 2022-04-01 DIAGNOSIS — I8500 Esophageal varices without bleeding: Secondary | ICD-10-CM | POA: Diagnosis not present

## 2022-04-01 DIAGNOSIS — M48061 Spinal stenosis, lumbar region without neurogenic claudication: Secondary | ICD-10-CM | POA: Diagnosis not present

## 2022-04-01 DIAGNOSIS — Z452 Encounter for adjustment and management of vascular access device: Secondary | ICD-10-CM | POA: Diagnosis not present

## 2022-04-01 DIAGNOSIS — G319 Degenerative disease of nervous system, unspecified: Secondary | ICD-10-CM | POA: Diagnosis not present

## 2022-04-01 DIAGNOSIS — I249 Acute ischemic heart disease, unspecified: Secondary | ICD-10-CM | POA: Diagnosis not present

## 2022-04-01 LAB — CBC AND DIFFERENTIAL
HCT: 25 — AB (ref 41–53)
Hemoglobin: 8.3 — AB (ref 13.5–17.5)
WBC: 3.1

## 2022-04-01 LAB — CBC: RBC: 2.73 — AB (ref 3.87–5.11)

## 2022-04-01 LAB — BASIC METABOLIC PANEL
BUN: 13 (ref 4–21)
CO2: 17 (ref 13–22)
Chloride: 107 (ref 99–108)
Creatinine: 1.1 (ref 0.6–1.3)
Glucose: 79
Potassium: 4.8 mEq/L (ref 3.5–5.1)
Sodium: 136 — AB (ref 137–147)

## 2022-04-01 LAB — COMPREHENSIVE METABOLIC PANEL
Albumin: 3.4 — AB (ref 3.5–5.0)
Calcium: 8.5 — AB (ref 8.7–10.7)

## 2022-04-02 DIAGNOSIS — R7881 Bacteremia: Secondary | ICD-10-CM | POA: Diagnosis not present

## 2022-04-07 DIAGNOSIS — M48061 Spinal stenosis, lumbar region without neurogenic claudication: Secondary | ICD-10-CM | POA: Diagnosis not present

## 2022-04-07 DIAGNOSIS — F1010 Alcohol abuse, uncomplicated: Secondary | ICD-10-CM | POA: Diagnosis not present

## 2022-04-07 DIAGNOSIS — Z792 Long term (current) use of antibiotics: Secondary | ICD-10-CM | POA: Diagnosis not present

## 2022-04-07 DIAGNOSIS — K7030 Alcoholic cirrhosis of liver without ascites: Secondary | ICD-10-CM | POA: Diagnosis not present

## 2022-04-07 DIAGNOSIS — D509 Iron deficiency anemia, unspecified: Secondary | ICD-10-CM | POA: Diagnosis not present

## 2022-04-07 DIAGNOSIS — G928 Other toxic encephalopathy: Secondary | ICD-10-CM | POA: Diagnosis not present

## 2022-04-07 DIAGNOSIS — D631 Anemia in chronic kidney disease: Secondary | ICD-10-CM | POA: Diagnosis not present

## 2022-04-07 DIAGNOSIS — I8500 Esophageal varices without bleeding: Secondary | ICD-10-CM | POA: Diagnosis not present

## 2022-04-07 DIAGNOSIS — T8144XA Sepsis following a procedure, initial encounter: Secondary | ICD-10-CM | POA: Diagnosis not present

## 2022-04-07 DIAGNOSIS — T8453XA Infection and inflammatory reaction due to internal right knee prosthesis, initial encounter: Secondary | ICD-10-CM | POA: Diagnosis not present

## 2022-04-07 DIAGNOSIS — I129 Hypertensive chronic kidney disease with stage 1 through stage 4 chronic kidney disease, or unspecified chronic kidney disease: Secondary | ICD-10-CM | POA: Diagnosis not present

## 2022-04-07 DIAGNOSIS — I2510 Atherosclerotic heart disease of native coronary artery without angina pectoris: Secondary | ICD-10-CM | POA: Diagnosis not present

## 2022-04-07 DIAGNOSIS — A4102 Sepsis due to Methicillin resistant Staphylococcus aureus: Secondary | ICD-10-CM | POA: Diagnosis not present

## 2022-04-07 DIAGNOSIS — N183 Chronic kidney disease, stage 3 unspecified: Secondary | ICD-10-CM | POA: Diagnosis not present

## 2022-04-07 DIAGNOSIS — Z9181 History of falling: Secondary | ICD-10-CM | POA: Diagnosis not present

## 2022-04-07 DIAGNOSIS — I249 Acute ischemic heart disease, unspecified: Secondary | ICD-10-CM | POA: Diagnosis not present

## 2022-04-07 DIAGNOSIS — Z452 Encounter for adjustment and management of vascular access device: Secondary | ICD-10-CM | POA: Diagnosis not present

## 2022-04-07 DIAGNOSIS — G319 Degenerative disease of nervous system, unspecified: Secondary | ICD-10-CM | POA: Diagnosis not present

## 2022-04-09 ENCOUNTER — Ambulatory Visit: Payer: Medicare HMO | Admitting: Internal Medicine

## 2022-04-09 ENCOUNTER — Encounter: Payer: Self-pay | Admitting: Family Medicine

## 2022-04-09 ENCOUNTER — Telehealth: Payer: Self-pay

## 2022-04-09 DIAGNOSIS — R7881 Bacteremia: Secondary | ICD-10-CM | POA: Diagnosis not present

## 2022-04-09 NOTE — Telephone Encounter (Signed)
Called patient to see if he would be able to make it to today's appointment, no answer. Left HIPAA compliant voicemail requesting callback.   Orders sent to Carolynn Sayers, RN with [facility] to pull PICC per Dr. Tycho Cheramie Salon. Also requested that if they hear from the patient they ask him to call our office to reschedule appointment.   Beryle Flock, RN

## 2022-04-10 DIAGNOSIS — K7030 Alcoholic cirrhosis of liver without ascites: Secondary | ICD-10-CM | POA: Diagnosis not present

## 2022-04-10 DIAGNOSIS — D631 Anemia in chronic kidney disease: Secondary | ICD-10-CM | POA: Diagnosis not present

## 2022-04-10 DIAGNOSIS — M48061 Spinal stenosis, lumbar region without neurogenic claudication: Secondary | ICD-10-CM | POA: Diagnosis not present

## 2022-04-10 DIAGNOSIS — I129 Hypertensive chronic kidney disease with stage 1 through stage 4 chronic kidney disease, or unspecified chronic kidney disease: Secondary | ICD-10-CM | POA: Diagnosis not present

## 2022-04-10 DIAGNOSIS — A4102 Sepsis due to Methicillin resistant Staphylococcus aureus: Secondary | ICD-10-CM | POA: Diagnosis not present

## 2022-04-10 DIAGNOSIS — F1010 Alcohol abuse, uncomplicated: Secondary | ICD-10-CM | POA: Diagnosis not present

## 2022-04-10 DIAGNOSIS — T8453XA Infection and inflammatory reaction due to internal right knee prosthesis, initial encounter: Secondary | ICD-10-CM | POA: Diagnosis not present

## 2022-04-10 DIAGNOSIS — G928 Other toxic encephalopathy: Secondary | ICD-10-CM | POA: Diagnosis not present

## 2022-04-10 DIAGNOSIS — T8144XA Sepsis following a procedure, initial encounter: Secondary | ICD-10-CM | POA: Diagnosis not present

## 2022-04-10 DIAGNOSIS — Z9181 History of falling: Secondary | ICD-10-CM | POA: Diagnosis not present

## 2022-04-10 DIAGNOSIS — I249 Acute ischemic heart disease, unspecified: Secondary | ICD-10-CM | POA: Diagnosis not present

## 2022-04-10 DIAGNOSIS — G319 Degenerative disease of nervous system, unspecified: Secondary | ICD-10-CM | POA: Diagnosis not present

## 2022-04-10 DIAGNOSIS — D509 Iron deficiency anemia, unspecified: Secondary | ICD-10-CM | POA: Diagnosis not present

## 2022-04-10 DIAGNOSIS — Z792 Long term (current) use of antibiotics: Secondary | ICD-10-CM | POA: Diagnosis not present

## 2022-04-10 DIAGNOSIS — Z452 Encounter for adjustment and management of vascular access device: Secondary | ICD-10-CM | POA: Diagnosis not present

## 2022-04-10 DIAGNOSIS — I8500 Esophageal varices without bleeding: Secondary | ICD-10-CM | POA: Diagnosis not present

## 2022-04-10 DIAGNOSIS — N183 Chronic kidney disease, stage 3 unspecified: Secondary | ICD-10-CM | POA: Diagnosis not present

## 2022-04-10 DIAGNOSIS — I2510 Atherosclerotic heart disease of native coronary artery without angina pectoris: Secondary | ICD-10-CM | POA: Diagnosis not present

## 2022-04-16 ENCOUNTER — Ambulatory Visit (INDEPENDENT_AMBULATORY_CARE_PROVIDER_SITE_OTHER): Payer: Medicare HMO | Admitting: Internal Medicine

## 2022-04-16 ENCOUNTER — Encounter: Payer: Self-pay | Admitting: Internal Medicine

## 2022-04-16 ENCOUNTER — Other Ambulatory Visit: Payer: Self-pay

## 2022-04-16 DIAGNOSIS — F1010 Alcohol abuse, uncomplicated: Secondary | ICD-10-CM | POA: Diagnosis not present

## 2022-04-16 DIAGNOSIS — Z792 Long term (current) use of antibiotics: Secondary | ICD-10-CM | POA: Diagnosis not present

## 2022-04-16 DIAGNOSIS — Z452 Encounter for adjustment and management of vascular access device: Secondary | ICD-10-CM | POA: Diagnosis not present

## 2022-04-16 DIAGNOSIS — G319 Degenerative disease of nervous system, unspecified: Secondary | ICD-10-CM | POA: Diagnosis not present

## 2022-04-16 DIAGNOSIS — I8500 Esophageal varices without bleeding: Secondary | ICD-10-CM | POA: Diagnosis not present

## 2022-04-16 DIAGNOSIS — G928 Other toxic encephalopathy: Secondary | ICD-10-CM | POA: Diagnosis not present

## 2022-04-16 DIAGNOSIS — D509 Iron deficiency anemia, unspecified: Secondary | ICD-10-CM | POA: Diagnosis not present

## 2022-04-16 DIAGNOSIS — T8453XA Infection and inflammatory reaction due to internal right knee prosthesis, initial encounter: Secondary | ICD-10-CM | POA: Diagnosis not present

## 2022-04-16 DIAGNOSIS — T8453XD Infection and inflammatory reaction due to internal right knee prosthesis, subsequent encounter: Secondary | ICD-10-CM

## 2022-04-16 DIAGNOSIS — I249 Acute ischemic heart disease, unspecified: Secondary | ICD-10-CM | POA: Diagnosis not present

## 2022-04-16 DIAGNOSIS — I129 Hypertensive chronic kidney disease with stage 1 through stage 4 chronic kidney disease, or unspecified chronic kidney disease: Secondary | ICD-10-CM | POA: Diagnosis not present

## 2022-04-16 DIAGNOSIS — M48061 Spinal stenosis, lumbar region without neurogenic claudication: Secondary | ICD-10-CM | POA: Diagnosis not present

## 2022-04-16 DIAGNOSIS — N183 Chronic kidney disease, stage 3 unspecified: Secondary | ICD-10-CM | POA: Diagnosis not present

## 2022-04-16 DIAGNOSIS — I2510 Atherosclerotic heart disease of native coronary artery without angina pectoris: Secondary | ICD-10-CM | POA: Diagnosis not present

## 2022-04-16 DIAGNOSIS — T8144XA Sepsis following a procedure, initial encounter: Secondary | ICD-10-CM | POA: Diagnosis not present

## 2022-04-16 DIAGNOSIS — D631 Anemia in chronic kidney disease: Secondary | ICD-10-CM | POA: Diagnosis not present

## 2022-04-16 DIAGNOSIS — K7030 Alcoholic cirrhosis of liver without ascites: Secondary | ICD-10-CM | POA: Diagnosis not present

## 2022-04-16 DIAGNOSIS — Z9181 History of falling: Secondary | ICD-10-CM | POA: Diagnosis not present

## 2022-04-16 DIAGNOSIS — A4102 Sepsis due to Methicillin resistant Staphylococcus aureus: Secondary | ICD-10-CM | POA: Diagnosis not present

## 2022-04-16 MED ORDER — DOXYCYCLINE HYCLATE 100 MG PO TABS: 100.0000 mg | ORAL_TABLET | Freq: Two times a day (BID) | ORAL | 11 refills | Status: DC

## 2022-04-16 NOTE — Assessment & Plan Note (Signed)
He is doing better.  Unfortunately his MRSA prosthetic joint infection is so chronic that there is no way to cure it other than probably above-the-knee amputation.  I reemphasized the need to try to not miss any doses of his doxycycline.  I will repeat his inflammatory markers today and see him back in 3 months.

## 2022-04-16 NOTE — Progress Notes (Signed)
[facility] for Infectious Disease  Patient Active Problem List   Diagnosis Date Noted   MRSA bacteremia 09/21/2018    Priority: High   Infection of prosthetic right knee joint ([name]) 09/20/2018    Priority: High   Epidural abscess 09/20/2018    Priority: High   S/P right TKA 05/02/2013    Priority: High   Toxic metabolic encephalopathy 77/93/9030   Septic joint of right knee joint (Mark [name]) 11/08/2020   S/P right TKA reimplantation 08/02/2020   Infection of total left knee replacement (Mark [name]) 03/20/2020   Cirrhosis of liver without ascites (Mark [name])    AKI (acute kidney injury) (Mark [name])    Bacteremia    Spinal stenosis of lumbar region without neurogenic claudication    Aspiration pneumonia ([name]) 09/24/2018   Abscess in epidural space of lumbar spine    Anemia of chronic disease 09/20/2018   Hypoalbuminemia 09/20/2018   Hypoglycemia without diagnosis of diabetes mellitus 09/20/2018   Hyperkalemia 05/18/2018   Edentulous 05/11/2018   Pancytopenia (Mark [name]) 05/10/2018   CAD (coronary artery disease) 05/10/2018   Hepatic encephalopathy (Mark [name]) 05/10/2018   Alcohol abuse 05/10/2018   GERD (gastroesophageal reflux disease) 05/10/2018   Duodenal ulcer    Renal failure    Acute on chronic anemia    Hypotension 06/17/2017   Hyponatremia 06/17/2017   Leg edema, right 01/18/3006   Acute metabolic encephalopathy 62/26/3335   Anemia, iron deficiency    Benign neoplasm of ascending colon    Hemorrhoids    Portal hypertensive gastropathy ([name])    Gastritis and gastroduodenitis    Esophageal varices without bleeding (Mark [name])    H/O: CVA (cerebrovascular accident) 12/01/2013   ACS (acute coronary syndrome) (Mark [name]) 12/01/2013   Anasarca 12/01/2013   Polysubstance abuse (Mark [name]) 12/01/2013   CKD (chronic kidney disease) stage 3, GFR 30-59 ml/min (Mark [name]) 12/01/2013   Murmur 04/14/2013   Right inguinal hernia 12/26/2010   Cirrhosis, alcoholic (Mark [name]) 45/62/5638    Patient's Medications  New  Prescriptions   No medications on file  Previous Medications   ASPIRIN EC 81 MG TABLET    Take 81 mg by mouth in the morning and at bedtime. Swallow whole.   DOCUSATE SODIUM (COLACE) 100 MG CAPSULE    Take 1 capsule (100 mg total) by mouth 2 (two) times daily.   FERROUS SULFATE 325 (65 FE) MG TABLET    Take 1 tablet (325 mg total) by mouth 2 (two) times daily with a meal. Take for two weeks as tolerated.   FOLIC ACID (FOLVITE) 1 MG TABLET    TAKE 1 TABLET(1 MG) BY MOUTH DAILY   FUROSEMIDE (LASIX) 20 MG TABLET    TAKE 1 TABLET(20 MG) BY MOUTH TWICE DAILY   METHOCARBAMOL (ROBAXIN) 500 MG TABLET    Take 1 tablet (500 mg total) by mouth every 6 (six) hours as needed for muscle spasms.   NADOLOL (CORGARD) 20 MG TABLET    TAKE 1 TABLET BY MOUTH EVERY DAY   PANTOPRAZOLE (PROTONIX) 40 MG TABLET    TAKE 1 TABLET BY MOUTH EVERY DAY   POLYETHYLENE GLYCOL (MIRALAX / GLYCOLAX) 17 G PACKET    Take 17 g by mouth daily.  Modified Medications   Modified Medication Previous Medication   DOXYCYCLINE (VIBRA-TABS) 100 MG TABLET doxycycline (VIBRA-TABS) 100 MG tablet      Take 1 tablet (100 mg total) by mouth 2 (two) times daily.    Take 100 mg by mouth 2 (two) times daily.  Discontinued Medications   No medications on file    Subjective: Mark [name] is in for his routine hospital follow-up visit.  He has had recurrent bacteremia.  He had group A strep bacteremia in February 2019, MSSA bacteremia in January 2020 and MRSA bacteremia in May 2020.  His last bacteremia was complicated by thoracic spine infection.  He was supposed to be taking chronic doxycycline ever since that hospitalization.  However, he stopped taking it sometime late 2020.  He is not exactly sure when or why he stopped it.   He began having increased pain, swelling and "popping" in his prosthetic right knee last fall.  He thinks that this started after he quit doxycycline.  He cannot recall who replaced his knee about 8 years ago.  He saw Dr. Lindwood Qua who told him that the prosthesis was infected and loose.  He said that fluid was drained off of the knee.  It was reported the cultures grew MRSA susceptible to doxycycline and daptomycin.  He was admitted to the hospital in late November and underwent resection arthroplasty and spacer placement.  No operative cultures were obtained. He has chronic renal insufficiency.  My partner discharged him on IV daptomycin and he completed 8 weeks of therapy on 05/02/2019 before switching back to chronic, suppressive oral doxycycline.    Unfortunately he stopped taking the doxycycline again.  His wife says that he was off about 3 weeks before he started having recurrent pain and swelling in his right prosthetic knee.  He was hospitalized again and underwent incision and drainage with polyexchange on 11/13/2020.  Cultures grew MRSA again which was still susceptible to doxycycline.  He was treated with IV daptomycin before switching back to chronic, suppressive doxycycline.  Unfortunately had another labs and was admitted to the hospital in early November.  An aspirate of his right prosthetic knee grew MRSA again which was still susceptible to doxycycline.  He did not undergo any surgery.  He was treated with daptomycin again and completed therapy on 04/09/2022 before transitioning back to doxycycline.  His knee is less swollen and less painful.  He did not have any problems tolerating his PICC or daptomycin.  Review of Systems: Review of Systems  Constitutional:  Negative for chills, diaphoresis and fever.  Gastrointestinal:  Negative for abdominal pain, diarrhea, nausea and vomiting.  Musculoskeletal:  Positive for back pain and joint pain.    Past Medical History:  Diagnosis Date   Alcohol abuse    Anemia    Arthritis    Ascites    Cirrhosis ([name])    Coffee ground emesis    Dehydration 06/17/2017   Febrile illness    GERD (gastroesophageal reflux disease)    Heart murmur    History of blood  transfusion    Hyperlipidemia    Hypertension    Leg swelling    Myocardial infarction (Mark [name]) 2012   Preop cardiovascular exam 04/14/2013   Sepsis (Mark [name]) 06/17/2017   Septic shock ([place]) 06/18/2017   SIRS (systemic inflammatory response syndrome) ([place]) 07/11/2017   Stroke ([place]) 10/2009   TIA   Thrombocytopenia ([place])     Social History   Tobacco Use   Smoking status: Some Days    Packs/day: 0.50    Years: 10.00    Total pack years: 5.00    Types: Cigarettes    Last attempt to quit: 01/05/2016    Years since quitting: 6.2   Smokeless tobacco: Never   Tobacco comments:    4  cigarettes a day  Vaping Use   Vaping Use: Never used  Substance Use Topics   Alcohol use: Yes    Comment: 2-3 beers/day   Drug use: Yes    Types: Marijuana    Family History  Problem Relation Age of Onset   Hypertension Father    Diabetes Father    Dementia Mother    Lupus Sister     No Known Allergies  Objective: Vitals:   04/16/22 1023  BP: 129/74  Pulse: 97  Temp: 97.6 F (36.4 C)  TempSrc: Temporal   There is no height or weight on file to calculate BMI.  Physical Exam Constitutional:      Comments: He is talkative and pleasant.  He is accompanied by his wife.  Cardiovascular:     Rate and Rhythm: Normal rate.  Pulmonary:     Effort: Pulmonary effort is normal.  Musculoskeletal:     Comments: His right knee incision has healed nicely.  He has diffuse swelling of his knee.  He says that the swelling is much improved since leaving the hospital.  His knee is warm to the touch.  There is no unusual erythema.  Psychiatric:        Mood and Affect: Mood normal.     Lab Results Sed Rate  Date Value  11/08/2020 >140 mm/hr (H)  06/12/2020 43 mm/h (H)  03/21/2020 90 mm/hr (H)   CRP  Date Value  11/08/2020 3.6 mg/dL (H)  06/12/2020 1.6 mg/L  03/21/2020 0.6 mg/dL      Problem List Items Addressed This Visit       High   Infection of prosthetic right knee joint [facility])    He  is doing better.  Unfortunately his MRSA prosthetic joint infection is so chronic that there is no way to cure it other than probably above-the-knee amputation.  I reemphasized the need to try to not miss any doses of his doxycycline.  I will repeat his inflammatory markers today and see him back in 3 months.      Relevant Orders   C-reactive protein   Sedimentation rate    Michel Bickers, MD [facility] for Infectious [place] [phone] pager   [phone] cell 04/16/2022, 10:52 AM

## 2022-04-17 LAB — SEDIMENTATION RATE: Sed Rate: >130 mm/h — ABNORMAL HIGH (ref 0–20)

## 2022-04-17 LAB — C-REACTIVE PROTEIN: CRP: 12.8 mg/L — ABNORMAL HIGH (ref <8.0)

## 2022-05-23 ENCOUNTER — Telehealth: Payer: Self-pay

## 2022-05-23 NOTE — Telephone Encounter (Signed)
Unsuccessful attempt to reach patient on preferred number listed in notes for scheduled AWV. Left message on voicemail okay to reschedule. 

## 2022-05-27 ENCOUNTER — Encounter: Payer: BC Managed Care – PPO | Admitting: Family Medicine

## 2022-05-29 DIAGNOSIS — Z5948 Other specified lack of adequate food: Secondary | ICD-10-CM | POA: Diagnosis not present

## 2022-05-29 DIAGNOSIS — R609 Edema, unspecified: Secondary | ICD-10-CM | POA: Diagnosis not present

## 2022-05-29 DIAGNOSIS — Z008 Encounter for other general examination: Secondary | ICD-10-CM | POA: Diagnosis not present

## 2022-05-29 DIAGNOSIS — K5900 Constipation, unspecified: Secondary | ICD-10-CM | POA: Diagnosis not present

## 2022-05-29 DIAGNOSIS — Z87891 Personal history of nicotine dependence: Secondary | ICD-10-CM | POA: Diagnosis not present

## 2022-05-29 DIAGNOSIS — M869 Osteomyelitis, unspecified: Secondary | ICD-10-CM | POA: Diagnosis not present

## 2022-05-29 DIAGNOSIS — Z8673 Personal history of transient ischemic attack (TIA), and cerebral infarction without residual deficits: Secondary | ICD-10-CM | POA: Diagnosis not present

## 2022-05-29 DIAGNOSIS — M1990 Unspecified osteoarthritis, unspecified site: Secondary | ICD-10-CM | POA: Diagnosis not present

## 2022-05-29 DIAGNOSIS — Z5982 Transportation insecurity: Secondary | ICD-10-CM | POA: Diagnosis not present

## 2022-05-29 DIAGNOSIS — Z5986 Financial insecurity: Secondary | ICD-10-CM | POA: Diagnosis not present

## 2022-05-29 DIAGNOSIS — I10 Essential (primary) hypertension: Secondary | ICD-10-CM | POA: Diagnosis not present

## 2022-06-03 ENCOUNTER — Telehealth: Payer: Self-pay | Admitting: Family Medicine

## 2022-06-03 NOTE — Telephone Encounter (Signed)
Left message for patient to call back and schedule Medicare Annual Wellness Visit (AWV) either virtually or in office. Left  my Herbie Drape number [phone]   Last AWV 05/16/21 please schedule with Nurse Health Adviser   45 min for awv-i  in office appointments 30 min for awv-s & awv-i phone/virtual appointments

## 2022-06-09 NOTE — Telephone Encounter (Signed)
Returned patients call  left message  Pt left message asking me to call

## 2022-06-11 ENCOUNTER — Ambulatory Visit: Payer: Medicare HMO | Admitting: Family Medicine

## 2022-06-12 ENCOUNTER — Ambulatory Visit (INDEPENDENT_AMBULATORY_CARE_PROVIDER_SITE_OTHER): Payer: Medicare HMO | Admitting: Family Medicine

## 2022-06-12 ENCOUNTER — Telehealth: Payer: Self-pay | Admitting: Family Medicine

## 2022-06-12 VITALS — BP 90/50 | HR 85 | Temp 97.8°F | Ht 66.0 in | Wt 130.6 lb

## 2022-06-12 DIAGNOSIS — M25361 Other instability, right knee: Secondary | ICD-10-CM | POA: Diagnosis not present

## 2022-06-12 DIAGNOSIS — Z96651 Presence of right artificial knee joint: Secondary | ICD-10-CM

## 2022-06-12 DIAGNOSIS — Z029 Encounter for administrative examinations, unspecified: Secondary | ICD-10-CM

## 2022-06-12 DIAGNOSIS — I10 Essential (primary) hypertension: Secondary | ICD-10-CM | POA: Diagnosis not present

## 2022-06-12 NOTE — Telephone Encounter (Signed)
Pt would like a call to schedule an AWV.

## 2022-06-12 NOTE — Progress Notes (Signed)
   Established Patient Office Visit   Subjective  Patient ID: Mark [name]., male    DOB: 1955-06-15  Age: 67 y.o. MRN: [id number]  Chief Complaint  Patient presents with  . Form completion    Pt is a 67 yo male with  has a past medical history of Alcohol abuse, Anemia, Arthritis, Ascites, Cirrhosis ([place]), Coffee ground emesis, Dehydration (06/17/2017), Febrile illness, GERD (gastroesophageal reflux disease), Heart murmur, History of blood transfusion, Hyperlipidemia, Hypertension, Leg swelling, MI, CVA, and Thrombocytopenia who is seen for f/u.  Pt accompanied by his wife.   {History (Optional):23778}  ROS Negative unless stated above    Objective:     BP (!) 90/50 (BP Location: Left Arm, Patient Position: Sitting, Cuff Size: Normal)   Pulse 85   Temp 97.8 F (36.6 C) (Oral)   Ht '5\' 6"'$  (1.676 m)   Wt 130 lb 9.6 oz (59.2 kg)   SpO2 98%   BMI 21.08 kg/m  {Vitals History (Optional):23777}  Physical Exam   No results found for any visits on 06/12/22.    Assessment & Plan:  There are no diagnoses linked to this encounter.  No follow-ups on file.   [name], MD

## 2022-06-17 ENCOUNTER — Telehealth: Payer: Self-pay | Admitting: Family Medicine

## 2022-06-17 NOTE — Telephone Encounter (Signed)
Called patient to schedule Medicare Annual Wellness Visit (AWV). Left message for patient to call back and schedule Medicare Annual Wellness Visit (AWV).  Last date of AWV: 05/16/21  Please schedule an appointment at any time with [facility] .  If any questions, please contact me at [phone].  Thank you ,  Barkley Boards AWV direct phone # [phone]

## 2022-06-17 NOTE — Telephone Encounter (Signed)
Lm asking pt to call

## 2022-07-07 ENCOUNTER — Inpatient Hospital Stay (HOSPITAL_COMMUNITY)
Admission: EM | Admit: 2022-07-07 | Discharge: 2022-07-09 | DRG: 559 | Disposition: A | Discharge status: Home / Self Care | Payer: Medicare HMO | Attending: Internal Medicine | Admitting: Internal Medicine

## 2022-07-07 ENCOUNTER — Emergency Department (HOSPITAL_COMMUNITY): Payer: Medicare HMO

## 2022-07-07 ENCOUNTER — Encounter (HOSPITAL_COMMUNITY): Payer: Self-pay

## 2022-07-07 ENCOUNTER — Inpatient Hospital Stay (HOSPITAL_COMMUNITY): Payer: Medicare HMO

## 2022-07-07 ENCOUNTER — Other Ambulatory Visit: Payer: Self-pay

## 2022-07-07 DIAGNOSIS — I10 Essential (primary) hypertension: Secondary | ICD-10-CM | POA: Diagnosis not present

## 2022-07-07 DIAGNOSIS — Z8673 Personal history of transient ischemic attack (TIA), and cerebral infarction without residual deficits: Secondary | ICD-10-CM

## 2022-07-07 DIAGNOSIS — M25461 Effusion, right knee: Secondary | ICD-10-CM | POA: Diagnosis not present

## 2022-07-07 DIAGNOSIS — Z1152 Encounter for screening for COVID-19: Secondary | ICD-10-CM | POA: Diagnosis not present

## 2022-07-07 DIAGNOSIS — K219 Gastro-esophageal reflux disease without esophagitis: Secondary | ICD-10-CM | POA: Diagnosis present

## 2022-07-07 DIAGNOSIS — E860 Dehydration: Secondary | ICD-10-CM | POA: Diagnosis present

## 2022-07-07 DIAGNOSIS — R079 Chest pain, unspecified: Secondary | ICD-10-CM | POA: Diagnosis not present

## 2022-07-07 DIAGNOSIS — K3189 Other diseases of stomach and duodenum: Secondary | ICD-10-CM | POA: Diagnosis present

## 2022-07-07 DIAGNOSIS — F1990 Other psychoactive substance use, unspecified, uncomplicated: Secondary | ICD-10-CM | POA: Diagnosis not present

## 2022-07-07 DIAGNOSIS — I679 Cerebrovascular disease, unspecified: Secondary | ICD-10-CM

## 2022-07-07 DIAGNOSIS — I252 Old myocardial infarction: Secondary | ICD-10-CM | POA: Diagnosis not present

## 2022-07-07 DIAGNOSIS — N179 Acute kidney failure, unspecified: Secondary | ICD-10-CM | POA: Diagnosis present

## 2022-07-07 DIAGNOSIS — M1990 Unspecified osteoarthritis, unspecified site: Secondary | ICD-10-CM | POA: Diagnosis not present

## 2022-07-07 DIAGNOSIS — R296 Repeated falls: Secondary | ICD-10-CM | POA: Diagnosis present

## 2022-07-07 DIAGNOSIS — K7030 Alcoholic cirrhosis of liver without ascites: Secondary | ICD-10-CM | POA: Diagnosis not present

## 2022-07-07 DIAGNOSIS — Z7982 Long term (current) use of aspirin: Secondary | ICD-10-CM

## 2022-07-07 DIAGNOSIS — E785 Hyperlipidemia, unspecified: Secondary | ICD-10-CM | POA: Diagnosis not present

## 2022-07-07 DIAGNOSIS — G9341 Metabolic encephalopathy: Secondary | ICD-10-CM | POA: Diagnosis not present

## 2022-07-07 DIAGNOSIS — F1190 Opioid use, unspecified, uncomplicated: Secondary | ICD-10-CM | POA: Diagnosis not present

## 2022-07-07 DIAGNOSIS — R400 Somnolence: Secondary | ICD-10-CM | POA: Diagnosis not present

## 2022-07-07 DIAGNOSIS — Z743 Need for continuous supervision: Secondary | ICD-10-CM | POA: Diagnosis not present

## 2022-07-07 DIAGNOSIS — N1330 Unspecified hydronephrosis: Secondary | ICD-10-CM | POA: Diagnosis not present

## 2022-07-07 DIAGNOSIS — R000 Tachycardia, unspecified: Secondary | ICD-10-CM | POA: Diagnosis not present

## 2022-07-07 DIAGNOSIS — M009 Pyogenic arthritis, unspecified: Secondary | ICD-10-CM | POA: Diagnosis not present

## 2022-07-07 DIAGNOSIS — K766 Portal hypertension: Secondary | ICD-10-CM | POA: Diagnosis present

## 2022-07-07 DIAGNOSIS — Y831 Surgical operation with implant of artificial internal device as the cause of abnormal reaction of the patient, or of later complication, without mention of misadventure at the time of the procedure: Secondary | ICD-10-CM | POA: Diagnosis present

## 2022-07-07 DIAGNOSIS — I2510 Atherosclerotic heart disease of native coronary artery without angina pectoris: Secondary | ICD-10-CM | POA: Diagnosis present

## 2022-07-07 DIAGNOSIS — Z66 Do not resuscitate: Secondary | ICD-10-CM | POA: Diagnosis present

## 2022-07-07 DIAGNOSIS — Z681 Body mass index (BMI) 19 or less, adult: Secondary | ICD-10-CM | POA: Diagnosis not present

## 2022-07-07 DIAGNOSIS — E43 Unspecified severe protein-calorie malnutrition: Secondary | ICD-10-CM | POA: Diagnosis present

## 2022-07-07 DIAGNOSIS — R410 Disorientation, unspecified: Secondary | ICD-10-CM | POA: Diagnosis not present

## 2022-07-07 DIAGNOSIS — T8453XA Infection and inflammatory reaction due to internal right knee prosthesis, initial encounter: Secondary | ICD-10-CM | POA: Diagnosis not present

## 2022-07-07 DIAGNOSIS — Z792 Long term (current) use of antibiotics: Secondary | ICD-10-CM

## 2022-07-07 DIAGNOSIS — F17210 Nicotine dependence, cigarettes, uncomplicated: Secondary | ICD-10-CM | POA: Diagnosis not present

## 2022-07-07 DIAGNOSIS — Z8249 Family history of ischemic heart disease and other diseases of the circulatory system: Secondary | ICD-10-CM

## 2022-07-07 DIAGNOSIS — R188 Other ascites: Secondary | ICD-10-CM | POA: Diagnosis not present

## 2022-07-07 DIAGNOSIS — F1010 Alcohol abuse, uncomplicated: Secondary | ICD-10-CM | POA: Diagnosis present

## 2022-07-07 DIAGNOSIS — N281 Cyst of kidney, acquired: Secondary | ICD-10-CM | POA: Diagnosis present

## 2022-07-07 DIAGNOSIS — Z96651 Presence of right artificial knee joint: Secondary | ICD-10-CM | POA: Diagnosis not present

## 2022-07-07 DIAGNOSIS — M25562 Pain in left knee: Secondary | ICD-10-CM | POA: Diagnosis not present

## 2022-07-07 DIAGNOSIS — G9340 Encephalopathy, unspecified: Secondary | ICD-10-CM | POA: Diagnosis not present

## 2022-07-07 DIAGNOSIS — Z79899 Other long term (current) drug therapy: Secondary | ICD-10-CM

## 2022-07-07 DIAGNOSIS — R69 Illness, unspecified: Secondary | ICD-10-CM | POA: Diagnosis not present

## 2022-07-07 DIAGNOSIS — Z8614 Personal history of Methicillin resistant Staphylococcus aureus infection: Secondary | ICD-10-CM

## 2022-07-07 DIAGNOSIS — R531 Weakness: Secondary | ICD-10-CM | POA: Diagnosis not present

## 2022-07-07 DIAGNOSIS — M25561 Pain in right knee: Secondary | ICD-10-CM

## 2022-07-07 DIAGNOSIS — T8450XA Infection and inflammatory reaction due to unspecified internal joint prosthesis, initial encounter: Secondary | ICD-10-CM

## 2022-07-07 DIAGNOSIS — D72819 Decreased white blood cell count, unspecified: Secondary | ICD-10-CM | POA: Diagnosis not present

## 2022-07-07 DIAGNOSIS — F1910 Other psychoactive substance abuse, uncomplicated: Secondary | ICD-10-CM | POA: Diagnosis present

## 2022-07-07 LAB — COMPREHENSIVE METABOLIC PANEL
ALT: 11 U/L (ref 0–44)
AST: 25 U/L (ref 15–41)
Albumin: 3.3 g/dL — ABNORMAL LOW (ref 3.5–5.0)
Alkaline Phosphatase: 98 U/L (ref 38–126)
Anion gap: 8 (ref 5–15)
BUN: 25 mg/dL — ABNORMAL HIGH (ref 8–23)
CO2: 24 mmol/L (ref 22–32)
Calcium: 8.6 mg/dL — ABNORMAL LOW (ref 8.9–10.3)
Chloride: 107 mmol/L (ref 98–111)
Creatinine, Ser: 1.86 mg/dL — ABNORMAL HIGH (ref 0.61–1.24)
GFR, Estimated: 39 mL/min — ABNORMAL LOW (ref >60)
Glucose, Bld: 94 mg/dL (ref 70–99)
Potassium: 4.2 mmol/L (ref 3.5–5.1)
Sodium: 139 mmol/L (ref 135–145)
Total Bilirubin: 0.6 mg/dL (ref 0.3–1.2)
Total Protein: 8.5 g/dL — ABNORMAL HIGH (ref 6.5–8.1)

## 2022-07-07 LAB — CBC
HCT: 32.9 % — ABNORMAL LOW (ref 39.0–52.0)
HCT: 29.8 % — ABNORMAL LOW (ref 39.0–52.0)
Hemoglobin: 10.2 g/dL — ABNORMAL LOW (ref 13.0–17.0)
Hemoglobin: 9.2 g/dL — ABNORMAL LOW (ref 13.0–17.0)
MCH: 30.2 pg (ref 26.0–34.0)
MCH: 30.8 pg (ref 26.0–34.0)
MCHC: 31 g/dL (ref 30.0–36.0)
MCHC: 30.9 g/dL (ref 30.0–36.0)
MCV: 97.3 fL (ref 80.0–100.0)
MCV: 99.7 fL (ref 80.0–100.0)
Platelets: 172 10*3/uL (ref 150–400)
Platelets: 80 10*3/uL — ABNORMAL LOW (ref 150–400)
RBC: 3.38 MIL/uL — ABNORMAL LOW (ref 4.22–5.81)
RBC: 2.99 MIL/uL — ABNORMAL LOW (ref 4.22–5.81)
RDW: 15.7 % — ABNORMAL HIGH (ref 11.5–15.5)
RDW: 15.8 % — ABNORMAL HIGH (ref 11.5–15.5)
WBC: 3.6 10*3/uL — ABNORMAL LOW (ref 4.0–10.5)
WBC: 2.8 10*3/uL — ABNORMAL LOW (ref 4.0–10.5)
nRBC: 0 % (ref 0.0–0.2)
nRBC: 0 % (ref 0.0–0.2)

## 2022-07-07 LAB — RESP PANEL BY RT-PCR (RSV, FLU A&B, COVID)  RVPGX2
Influenza A by PCR: NEGATIVE
Influenza B by PCR: NEGATIVE
Resp Syncytial Virus by PCR: NEGATIVE
SARS Coronavirus 2 by RT PCR: NEGATIVE

## 2022-07-07 LAB — BLOOD GAS, ARTERIAL
Acid-Base Excess: 2.5 mmol/L — ABNORMAL HIGH (ref 0.0–2.0)
Bicarbonate: 27.9 mmol/L (ref 20.0–28.0)
Drawn by: 54496
FIO2: 21 %
O2 Saturation: 98.3 %
Patient temperature: 36.5
RATE: 88 resp/min
pCO2 arterial: 44 mmHg (ref 32–48)
pH, Arterial: 7.41 (ref 7.35–7.45)
pO2, Arterial: 85 mmHg (ref 83–108)

## 2022-07-07 LAB — ETHANOL: Alcohol, Ethyl (B): <10 mg/dL (ref <10)

## 2022-07-07 LAB — AMMONIA: Ammonia: 15 umol/L (ref 9–35)

## 2022-07-07 LAB — C-REACTIVE PROTEIN: CRP: 3.7 mg/dL — ABNORMAL HIGH (ref <1.0)

## 2022-07-07 LAB — LACTIC ACID, PLASMA: Lactic Acid, Venous: 1.3 mmol/L (ref 0.5–1.9)

## 2022-07-07 LAB — CREATININE, SERUM
Creatinine, Ser: 1.49 mg/dL — ABNORMAL HIGH (ref 0.61–1.24)
GFR, Estimated: 51 mL/min — ABNORMAL LOW (ref >60)

## 2022-07-07 LAB — CBG MONITORING, ED: Glucose-Capillary: 96 mg/dL (ref 70–99)

## 2022-07-07 LAB — TSH: TSH: 1.039 u[IU]/mL (ref 0.350–4.500)

## 2022-07-07 MED ORDER — ONDANSETRON HCL 4 MG PO TABS: 4.0000 mg | ORAL_TABLET | Freq: Four times a day (QID) | ORAL | Status: DC | PRN

## 2022-07-07 MED ORDER — ASPIRIN 81 MG PO TBEC
81.0000 mg | DELAYED_RELEASE_TABLET | Freq: Two times a day (BID) | ORAL | Status: DC
  Administered 2022-07-08 – 2022-07-09 (×3): 81 mg via ORAL
  Filled 2022-07-07 (×3): qty 1

## 2022-07-07 MED ORDER — VANCOMYCIN HCL IN DEXTROSE 1-5 GM/200ML-% IV SOLN: 1000.0000 mg | INTRAVENOUS | Status: DC

## 2022-07-07 MED ORDER — ALBUTEROL SULFATE (2.5 MG/3ML) 0.083% IN NEBU: 2.5000 mg | INHALATION_SOLUTION | RESPIRATORY_TRACT | Status: DC | PRN

## 2022-07-07 MED ORDER — ENOXAPARIN SODIUM 40 MG/0.4ML IJ SOSY
40.0000 mg | PREFILLED_SYRINGE | Freq: Every day | INTRAMUSCULAR | Status: DC
  Administered 2022-07-07 – 2022-07-08 (×2): 40 mg via SUBCUTANEOUS
  Filled 2022-07-07 (×2): qty 0.4

## 2022-07-07 MED ORDER — LACTATED RINGERS IV SOLN: INTRAVENOUS | Status: DC

## 2022-07-07 MED ORDER — LACTATED RINGERS IV BOLUS
1500.0000 mL | Freq: Once | INTRAVENOUS | Status: AC
  Administered 2022-07-07: 1500 mL via INTRAVENOUS

## 2022-07-07 MED ORDER — PANTOPRAZOLE SODIUM 40 MG PO TBEC: 40.0000 mg | DELAYED_RELEASE_TABLET | Freq: Every day | ORAL | Status: DC

## 2022-07-07 MED ORDER — FOLIC ACID 1 MG PO TABS: 1.0000 mg | ORAL_TABLET | Freq: Every day | ORAL | Status: DC

## 2022-07-07 MED ORDER — SODIUM CHLORIDE 0.9 % IV SOLN
2.0000 g | Freq: Once | INTRAVENOUS | Status: DC
  Filled 2022-07-07: qty 20

## 2022-07-07 MED ORDER — VANCOMYCIN HCL 1250 MG/250ML IV SOLN
1250.0000 mg | Freq: Once | INTRAVENOUS | Status: AC
  Administered 2022-07-07: 1250 mg via INTRAVENOUS
  Filled 2022-07-07: qty 250

## 2022-07-07 MED ORDER — ONDANSETRON HCL 4 MG/2ML IJ SOLN: 4.0000 mg | Freq: Four times a day (QID) | INTRAMUSCULAR | Status: DC | PRN

## 2022-07-07 MED ORDER — FERROUS SULFATE 325 (65 FE) MG PO TABS
325.0000 mg | ORAL_TABLET | Freq: Two times a day (BID) | ORAL | Status: DC
  Administered 2022-07-08 – 2022-07-09 (×3): 325 mg via ORAL
  Filled 2022-07-07 (×3): qty 1

## 2022-07-07 NOTE — Progress Notes (Signed)
Pharmacy Antibiotic Note  Mark [name]. is a 67 y.o. male with hx recurrent MRSA bacteremia and infected prosthetic right knee who was treated with multiple times in the past with IV abx and placed on chronic doxycycline for suppression.  He presented to the ED on 07/07/2022 with c/o knee pain.  Pharmacy has been consulted to dose vancomycin for suspected knee infection.    Plan: - vancomycin 1250 mg IV x1, then 1000 mg q36h for est AUC 510 - scr in AM on 3/12 ___________________________________________________  Temp (24hrs), Avg:97.8 F (36.6 C), Min:97.8 F (36.6 C), Max:97.8 F (36.6 C)  Recent Labs  Lab 07/07/22 1834  WBC 3.6*  CREATININE 1.86*  LATICACIDVEN 1.3    CrCl cannot be calculated (Unknown ideal weight.).    No Known Allergies   Thank you for allowing pharmacy to be a part of this patient's care.  Lynelle Doctor 07/07/2022 9:02 PM

## 2022-07-07 NOTE — ED Notes (Signed)
ED TO INPATIENT HANDOFF REPORT  ED Nurse Name and Phone #: Tonette Bihari [phone]  S Name/Age/Gender Mark [name]. 67 y.o. male Room/Bed: WA16/WA16  Code Status   Code Status: DNR  Home/SNF/Other Home Patient oriented to: self, place, time, and situation Is this baseline? Yes   Triage Complete: Triage complete  Chief Complaint Acute metabolic encephalopathy [id number]  Triage Note Per EMS, pt is coming from home. Pt originally called out for right knee pain. EMS noted that pt was slow to respond to questions, and grandson was concerned for altered mental status. EMS noted HR of 120 on scene.    Allergies No Known Allergies  Level of Care/Admitting Diagnosis ED Disposition     ED Disposition  Admit   Condition  --   Comment  Hospital Area: [facility] [[id number]]  Level of Care: Progressive [102]  Admit to Progressive based on following criteria: NEUROLOGICAL AND NEUROSURGICAL complex patients with significant risk of instability, who do not meet ICU criteria, yet require close observation or frequent assessment (< / = every 2 - 4 hours) with medical / nursing intervention.  May admit patient to [facility] or [facility] if equivalent level of care is available:: Yes  Covid Evaluation: Asymptomatic - no recent exposure (last 10 days) testing not required  Diagnosis: Acute metabolic encephalopathy [id number]  Admitting Physician: Jonetta Osgood [[id number]]  Attending Physician: Jonetta Osgood [id number]  Certification:: I certify this patient will need inpatient services for at least 2 midnights  Estimated Length of Stay: 2          B Medical/Surgery History Past Medical History:  Diagnosis Date   Alcohol abuse    Anemia    Arthritis    Ascites    Cirrhosis ([place])    Coffee ground emesis    Dehydration 06/17/2017   Febrile illness    GERD (gastroesophageal reflux disease)    Heart murmur    History of blood transfusion    Hyperlipidemia     Hypertension    Leg swelling    Myocardial infarction ([place]) 2012   Preop cardiovascular exam 04/14/2013   Sepsis ([place]) 06/17/2017   Septic shock ([place]) 06/18/2017   SIRS (systemic inflammatory response syndrome) ([place]) 07/11/2017   Stroke ([place]) 10/2009   TIA   Thrombocytopenia ([place])    Past Surgical History:  Procedure Laterality Date   COLONOSCOPY WITH PROPOFOL N/A 02/07/2016   Procedure: COLONOSCOPY WITH PROPOFOL;  Surgeon: Milus Banister, MD;  [facility];  Service: Endoscopy;  Laterality: N/A;   ESOPHAGOGASTRODUODENOSCOPY (EGD) WITH PROPOFOL N/A 02/07/2016   Procedure: ESOPHAGOGASTRODUODENOSCOPY (EGD) WITH PROPOFOL;  Surgeon: Milus Banister, MD;  [facility];  Service: Endoscopy;  Laterality: N/A;   ESOPHAGOGASTRODUODENOSCOPY (EGD) WITH PROPOFOL N/A 06/22/2017   Procedure: ESOPHAGOGASTRODUODENOSCOPY (EGD) WITH PROPOFOL;  Surgeon: Jerene Bears, MD;  Location: [facility] ENDOSCOPY;  Service: Gastroenterology;  Laterality: N/A;   EXCISIONAL TOTAL KNEE ARTHROPLASTY WITH ANTIBIOTIC SPACERS Right 03/20/2020   Procedure: Resection right total knee arthroplasty and placement of antibiotic spacer;  Surgeon: Paralee Cancel, MD;  [facility]: [facility];  Service: Orthopedics;  Laterality: Right;  90 mins   HERNIA REPAIR Right    inguinal   INGUINAL HERNIA REPAIR Left 02/08/2021   Procedure: OPEN LEFT INGUINAL HERNIA REPAIR WITH MESH;  Surgeon: Coralie Keens, MD;  Location: [facility];  Service: General;  Laterality: Left;   IRRIGATION AND DEBRIDEMENT KNEE Right 11/13/2020   Procedure: IRRIGATION AND DEBRIDEMENT KNEE;  Surgeon: Alvan Dame,  Rodman Key, MD;  [facility];  Service: Orthopedics;  Laterality: Right;   KNEE ARTHROSCOPY     bilateral/  12/14   REIMPLANTATION OF TOTAL KNEE Right 08/02/2020   Procedure: REIMPLANTATION/REVISION OF TOTAL KNEE WITH REMOVAL OF ANTIBIOTIC SPACER;  Surgeon: Paralee Cancel, MD;  [facility];  Service: Orthopedics;  Laterality: Right;  22mns   TEE WITHOUT  CARDIOVERSION N/A 05/13/2018   Procedure: TRANSESOPHAGEAL ECHOCARDIOGRAM (TEE);  Surgeon: AElouise Munroe MD;  [facility]  Service: Cardiovascular;  Laterality: N/A;   TOTAL KNEE ARTHROPLASTY Right 05/02/2013   Procedure: RIGHT TOTAL KNEE ARTHROPLASTY;  Surgeon: MMauri Pole MD;  [facility];  Service: Orthopedics;  Laterality: Right;     A IV Location/Drains/Wounds Patient Lines/Drains/Airways Status     Active Line/Drains/Airways     Name Placement date Placement time Site Days   Peripheral IV 07/07/22 20 G Anterior;Left Forearm 07/07/22  1813  Forearm  less than 1   Peripheral IV 07/07/22 20 G Anterior;Right Forearm 07/07/22  2131  Forearm  less than 1   PICC Single Lumen 03/03/22 Right Brachial 34 cm 0 cm 03/03/22  1320  Brachial  126   Incision (Closed) 03/20/20 Knee Right 03/20/20  1243  -- 839   Incision (Closed) 08/02/20 Knee Right 08/02/20  1258  -- 704   Incision (Closed) 11/13/20 Knee Right 11/13/20  2026  -- 601   Incision (Closed) 02/08/21 Groin Left 02/08/21  1601  -- 514   Wound / Incision (Open or Dehisced) 03/01/22 Non-pressure wound Foot Anterior;Left old wound. 03/01/22  1811  Foot  128            Intake/Output Last 24 hours No intake or output data in the 24 hours ending 07/07/22 2245  Labs/Imaging Results for orders placed or performed during the hospital encounter of 07/07/22 (from the past 48 hour(s))  CBG monitoring, ED     Status: None   Collection Time: 07/07/22  6:09 PM  Result Value Ref Range   Glucose-Capillary 96 70 - 99 mg/dL    Comment: Glucose reference range applies only to samples taken after fasting for at least 8 hours.  Comprehensive metabolic panel     Status: Abnormal   Collection Time: 07/07/22  6:34 PM  Result Value Ref Range   Sodium 139 135 - 145 mmol/L   Potassium 4.2 3.5 - 5.1 mmol/L   Chloride 107 98 - 111 mmol/L   CO2 24 22 - 32 mmol/L   Glucose, Bld 94 70 - 99 mg/dL    Comment: Glucose reference range  applies only to samples taken after fasting for at least 8 hours.   BUN 25 (H) 8 - 23 mg/dL   Creatinine, Ser 1.86 (H) 0.61 - 1.24 mg/dL   Calcium 8.6 (L) 8.9 - 10.3 mg/dL   Total Protein 8.5 (H) 6.5 - 8.1 g/dL   Albumin 3.3 (L) 3.5 - 5.0 g/dL   AST 25 15 - 41 U/L   ALT 11 0 - 44 U/L   Alkaline Phosphatase 98 38 - 126 U/L   Total Bilirubin 0.6 0.3 - 1.2 mg/dL   GFR, Estimated 39 (L) >60 mL/min    Comment: (NOTE) Calculated using the CKD-EPI Creatinine Equation (2021)    Anion gap 8 5 - 15    Comment: Performed at [facility] [address] [place]  [id number] CBC     Status: Abnormal   Collection Time: 07/07/22  6:34 PM  Result Value Ref Range   WBC 3.6 (L) 4.0 - 10.5 K/uL   RBC 3.38 (L) 4.22 - 5.81 MIL/uL   Hemoglobin 10.2 (L) 13.0 - 17.0 g/dL   HCT 32.9 (L) 39.0 - 52.0 %   MCV 97.3 80.0 - 100.0 fL   MCH 30.2 26.0 - 34.0 pg   MCHC 31.0 30.0 - 36.0 g/dL   RDW 15.7 (H) 11.5 - 15.5 %   Platelets 172 150 - 400 K/uL   nRBC 0.0 0.0 - 0.2 %    Comment: Performed at [facility], [place] [address], [place], [place] [address]  Lactic acid, plasma     Status: None   Collection Time: 07/07/22  6:34 PM  Result Value Ref Range   Lactic Acid, Venous 1.3 0.5 - 1.9 mmol/L    Comment: Performed at [facility], [place] [address], [place], [place] [address]  Resp panel by RT-PCR (RSV, Flu A&B, Covid) Anterior Nasal Swab     Status: None   Collection Time: 07/07/22  6:34 PM   Specimen: Anterior Nasal Swab  Result Value Ref Range   SARS Coronavirus 2 by RT PCR NEGATIVE NEGATIVE    Comment: (NOTE) SARS-CoV-2 target nucleic acids are NOT DETECTED.  The SARS-CoV-2 RNA is generally detectable in upper respiratory specimens during the acute phase of infection. The lowest concentration of SARS-CoV-2 viral copies this assay can detect is 138 copies/mL. A negative result does not preclude SARS-Cov-2 infection and should not be used as  the sole basis for treatment or other patient management decisions. A negative result may occur with  improper specimen collection/handling, submission of specimen other than nasopharyngeal swab, presence of viral mutation(s) within the areas targeted by this assay, and inadequate number of viral copies(<138 copies/mL). A negative result must be combined with clinical observations, patient history, and epidemiological information. The expected result is Negative.  Fact Sheet for Patients:  EntrepreneurPulse.com.au  Fact Sheet for Healthcare Providers:  IncredibleEmployment.be  This test is no t yet approved or cleared by the [place] FDA and  has been authorized for detection and/or diagnosis of SARS-CoV-2 by FDA under an Emergency Use Authorization (EUA). This EUA will remain  in effect (meaning this test can be used) for the duration of the COVID-19 declaration under Section 564(b)(1) of the Act, 21 U.S.C.section 360bbb-3(b)(1), unless the authorization is terminated  or revoked sooner.       Influenza A by PCR NEGATIVE NEGATIVE   Influenza B by PCR NEGATIVE NEGATIVE    Comment: (NOTE) The Xpert Xpress SARS-CoV-2/FLU/RSV plus assay is intended as an aid in the diagnosis of influenza from Nasopharyngeal swab specimens and should not be used as a sole basis for treatment. Nasal washings and aspirates are unacceptable for Xpert Xpress SARS-CoV-2/FLU/RSV testing.  Fact Sheet for Patients: EntrepreneurPulse.com.au  Fact Sheet for Healthcare Providers: IncredibleEmployment.be  This test is not yet approved or cleared by the [place] FDA and has been authorized for detection and/or diagnosis of SARS-CoV-2 by FDA under an Emergency Use Authorization (EUA). This EUA will remain in effect (meaning this test can be used) for the duration of the COVID-19 declaration under Section 564(b)(1) of the Act, 21  U.S.C. section 360bbb-3(b)(1), unless the authorization is terminated or revoked.     Resp Syncytial Virus by PCR NEGATIVE NEGATIVE    Comment: (NOTE) Fact Sheet for Patients: EntrepreneurPulse.com.au  Fact Sheet for Healthcare Providers: IncredibleEmployment.be  This test is not yet approved or cleared by the  [place] Architectural technologist and has been authorized for detection and/or diagnosis of SARS-CoV-2 by FDA under an Print production planner (EUA). This EUA will remain in effect (meaning this test can be used) for the duration of the COVID-19 declaration under Section 564(b)(1) of the Act, 21 U.S.C. section 360bbb-3(b)(1), unless the authorization is terminated or revoked.  Performed at [facility], [place] [address] [place], [place] [address]   Ethanol     Status: None   Collection Time: 07/07/22  7:28 PM  Result Value Ref Range   Alcohol, Ethyl (B) <10 <10 mg/dL    Comment: (NOTE) Lowest detectable limit for serum alcohol is 10 mg/dL.  For medical purposes only. Performed at [facility], [place] [address] [place], [place] [address]   TSH     Status: None   Collection Time: 07/07/22  7:28 PM  Result Value Ref Range   TSH 1.039 0.350 - 4.500 uIU/mL    Comment: Performed by a 3rd Generation assay with a functional sensitivity of <=0.01 uIU/mL. Performed at [facility], [place] [address] [place], [place] [address]   Ammonia     Status: None   Collection Time: 07/07/22  7:28 PM  Result Value Ref Range   Ammonia 15 9 - 35 umol/L    Comment: Performed at [name], [place] [address], [place], [place] [address]  CBC     Status: Abnormal   Collection Time: 07/07/22  9:49 PM  Result Value Ref Range   WBC 2.8 (L) 4.0 - 10.5 K/uL   RBC 2.99 (L) 4.22 - 5.81 MIL/uL   Hemoglobin 9.2 (L) 13.0 - 17.0 g/dL   HCT 29.8 (L) 39.0 - 52.0 %   MCV 99.7 80.0 - 100.0 fL   MCH 30.8  26.0 - 34.0 pg   MCHC 30.9 30.0 - 36.0 g/dL   RDW 15.8 (H) 11.5 - 15.5 %   Platelets 80 (L) 150 - 400 K/uL    Comment: Immature Platelet Fraction may be clinically indicated, consider ordering this additional test [id number] DELTA CHECK NOTED REPEATED TO VERIFY SPECIMEN CHECKED FOR CLOTS PLATELET COUNT CONFIRMED BY SMEAR    nRBC 0.0 0.0 - 0.2 %    Comment: Performed at [facility], [place] [address] [place], [place] [address]  Creatinine, serum     Status: Abnormal   Collection Time: 07/07/22  9:49 PM  Result Value Ref Range   Creatinine, Ser 1.49 (H) 0.61 - 1.24 mg/dL   GFR, Estimated 51 (L) >60 mL/min    Comment: (NOTE) Calculated using the CKD-EPI Creatinine Equation (2021) Performed at [facility], [place] [address] [place], [place] [address]   Blood gas, arterial     Status: Abnormal   Collection Time: 07/07/22 10:31 PM  Result Value Ref Range   FIO2 21 %   Mode ROOM AIR    RATE 88 resp/min   pH, Arterial 7.41 7.35 - 7.45   pCO2 arterial 44 32 - 48 mmHg   pO2, Arterial 85 83 - 108 mmHg   Bicarbonate 27.9 20.0 - 28.0 mmol/L   Acid-Base Excess 2.5 (H) 0.0 - 2.0 mmol/L   O2 Saturation 98.3 %   Patient temperature 36.5    Collection site RIGHT RADIAL    Drawn by [id number]    Allens test (pass/fail) PASS PASS    Comment: Performed at [facility], [place] [address] [place],  [address]   US Abdomen Limited  Result Date: 07/07/2022 CLINICAL DATA:  Assess for ascites EXAM: LIMITED ABDOMEN ULTRASOUND FOR ASCITES TECHNIQUE: Limited ultrasound survey for ascites was performed in all four abdominal quadrants. COMPARISON:  None Available. FINDINGS: No free fluid identified in the 4 abdominal quadrants. IMPRESSION: No ascites. Electronically Signed   By: Ronney Asters M.D.   On: 07/07/2022 22:34   US RENAL  Result Date: 07/07/2022 CLINICAL DATA:  [id number] AKI (acute kidney injury) ([place]) [id number] EXAM: RENAL / URINARY TRACT  ULTRASOUND COMPLETE COMPARISON:  CT abdomen pelvis 06/17/2017, ultrasound renal 06/17/2017 FINDINGS: Right Kidney: Renal measurements: 9.4 x 4.3 x 4.1 cm = volume: 87 mL. Echogenicity within normal limits. No mass or hydronephrosis visualized. Left Kidney: Renal measurements: 9.3 x 4.1 x 5.1 cm = volume: 109 mL. Echogenicity within normal limits. A stable simple cystic lesion measuring up to 3.3 cm. Stable in size 1.1 x 1 x 1 cm left superior renal pole hypodensity. Hydronephrosis visualized. Urinary bladder: Appears normal for degree of bladder distention. Other: None. IMPRESSION: 1. Indeterminate stable in size 1.1 x 1 x 1 cm left superior renal pole hypodensity. Query simple cystic lesion with no definite increased through transmission identified. Consider 3 month outpatient follow-up ultrasound for further evaluation. 2. Otherwise unremarkable renal ultrasound. Electronically Signed   By: Iven Finn M.D.   On: 07/07/2022 22:33   DG Knee Complete 4 Views Right  Result Date: 07/07/2022 CLINICAL DATA:  Right knee pain EXAM: RIGHT KNEE - COMPLETE 4+ VIEW COMPARISON:  02/26/2022 FINDINGS: Frontal, bilateral oblique, and lateral views of the right knee are obtained on 4 images. 3 component right knee arthroplasty is again identified, with no significant change in the alignment of the orthopedic hardware. Resorptive changes surrounding the proximal tibia and distal femur along the margins of the orthopedic hardware again noted, concerning for prosthesis infection. There has been moderate improvement in the subcutaneous edema seen previously surrounding the right knee. Persistent moderate right knee effusion. There are no acute displaced fractures. IMPRESSION: 1. Persistent periprosthetic lucency of the distal femur and proximal tibia, with associated soft tissue swelling and moderate joint effusion. 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 2. No acute fracture. Electronically Signed    By: Randa Ngo M.D.   On: 07/07/2022 19:49   DG Chest Port 1 View  Result Date: 07/07/2022 CLINICAL DATA:  Weakness, right knee pain EXAM: PORTABLE CHEST 1 VIEW COMPARISON:  02/26/2022 FINDINGS: The heart size and mediastinal contours are within normal limits. Both lungs are clear. The visualized skeletal structures are unremarkable. IMPRESSION: No active disease. Electronically Signed   By: Randa Ngo M.D.   On: 07/07/2022 19:46   CT Head Wo Contrast  Result Date: 07/07/2022 CLINICAL DATA:  Confusion, altered level of consciousness EXAM: CT HEAD WITHOUT CONTRAST TECHNIQUE: Contiguous axial images were obtained from the base of the skull through the vertex without intravenous contrast. RADIATION DOSE REDUCTION: This exam was performed according to the departmental dose-optimization program which includes automated exposure control, adjustment of the mA and/or kV according to patient size and/or use of iterative reconstruction technique. COMPARISON:  02/26/2022 FINDINGS: Brain: No acute infarct or hemorrhage. Lateral ventricles and midline structures are unremarkable. No acute extra-axial fluid collections. No mass effect. Vascular: No hyperdense vessel or unexpected calcification. Skull: Normal. Negative for fracture or focal lesion. Sinuses/Orbits: Mild mucosal thickening within the right maxillary sinus and sphenoid sinus. Other: None. IMPRESSION: 1. No acute intracranial process. Electronically Signed   By: Randa Ngo M.D.   On: 07/07/2022  19:45    Pending Labs Unresulted Labs (From admission, onward)     Start     Ordered   07/14/22 0500  Creatinine, serum  (enoxaparin (LOVENOX)    CrCl >/= 30 ml/min)  Weekly,   R     Comments: while on enoxaparin therapy    07/07/22 2122   07/08/22 0500  CBC  Tomorrow morning,   R        07/07/22 2122   07/08/22 0500  Comprehensive metabolic panel  Tomorrow morning,   R        07/07/22 2122   07/08/22 0500  Protime-INR  Tomorrow morning,   R         07/07/22 2122   07/07/22 2116  Sedimentation rate  Once,   R        07/07/22 2115   07/07/22 2116  C-reactive protein  Once,   R        07/07/22 2115   07/07/22 2103  Urinalysis, Routine w reflex microscopic -Urine, Clean Catch  Once,   R       Question:  Specimen Source  Answer:  Urine, Clean Catch   07/07/22 2102   07/07/22 2027  Blood culture (routine x 2)  BLOOD CULTURE X 2,   R (with STAT occurrences)      07/07/22 2026   07/07/22 1856  Rapid urine drug screen (hospital performed)  ONCE - STAT,   STAT        07/07/22 1855            Vitals/Pain Today's Vitals   07/07/22 1806 07/07/22 1815 07/07/22 2130 07/07/22 2229  BP: 125/70  139/83   Pulse: (!) 102     Resp: 13  12   Temp: 97.8 F (36.6 C)  (!) 97.4 F (36.3 C) 97.7 F (36.5 C)  TempSrc: Oral  Oral Oral  SpO2: 100%  100%   PainSc:  10-Worst pain ever      Isolation Precautions No active isolations  Medications Medications  vancomycin (VANCOREADY) IVPB 1250 mg/250 mL (1,250 mg Intravenous New Bag/Given 07/07/22 2242)  aspirin EC tablet 81 mg (has no administration in time range)  ferrous sulfate tablet 325 mg (has no administration in time range)  enoxaparin (LOVENOX) injection 40 mg (40 mg Subcutaneous Given 07/07/22 2146)  lactated ringers infusion ( Intravenous New Bag/Given 07/07/22 2240)  ondansetron (ZOFRAN) tablet 4 mg (has no administration in time range)    Or  ondansetron (ZOFRAN) injection 4 mg (has no administration in time range)  albuterol (PROVENTIL) (2.5 MG/3ML) 0.083% nebulizer solution 2.5 mg (has no administration in time range)  lactated ringers bolus 1,500 mL (1,500 mLs Intravenous Bolus 07/07/22 1939)    Mobility walks     Focused Assessments    R Recommendations: See Admitting Provider Note  Report given to:   Additional Notes:

## 2022-07-07 NOTE — H&P (Addendum)
History and Physical    Patient: Mark [name]. [id number] DOB: 1955-05-11 DOA: 07/07/2022 DOS: the patient was seen and examined on 07/07/2022 PCP: [name], MD  Patient coming from: Home  Chief Complaint:  Chief Complaint  Patient presents with   Altered Mental Status   Knee Pain   HPI: Mark [name]. is a 67 y.o. 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  [facility] was consulted for hospitalization-given concern for acute on chronic right knee prosthetic infection-as his prior knee infections were associated with similar presentation/encephalopathy.  Please note-patient is a poor historian-most of this history is obtained after speaking with ED-MD, ED-RN, and patient's spouse Mark [name] over the phone.  Per patient's spouse-patient was in his usual state of health till 3 days ago.  Apparently since then-he has become very slow to respond-sleeping most of the time.  He hss also been confused.  Family has not noted any fever.  They have not noted any nausea, vomiting or diarrhea.  Patient denies any abdominal pain.  Family has noted that he has fallen at least 3-5 times in the past several days, he normally apparently walks with the help of crutches. After repeatedly asking-patient although slow to respond-does acknowledge that he has had worsening pain in his right knee that has prevented him from ambulating.    Patient acknowledges drinking beer-claims he drinks at least one 40 ounce bottle on a daily basis.  Although patient denies any drug use-spouse acknowledges that he snorts heroin almost on a daily basis.  She thinks he does it for pain  issues.   Patient was evaluated in the emergency room-found to have mild leukopenia, AKI-CT head was negative for acute abnormalities, ammonia levels were within normal limits.  Due to clinical similarity with prior episodes of prosthetic knee infection-[facility] was asked to admit this patient.  Review of Systems: As mentioned in the history of present illness. All other systems reviewed and are negative. Past Medical History:  Diagnosis Date   Alcohol abuse    Anemia    Arthritis    Ascites    Cirrhosis ([place])    Coffee ground emesis    Dehydration 06/17/2017   Febrile illness    GERD (gastroesophageal reflux disease)    Heart murmur    History of blood transfusion    Hyperlipidemia    Hypertension    Leg swelling    Myocardial infarction ([place]) 2012   Preop cardiovascular exam 04/14/2013   Sepsis ([place]) 06/17/2017   Septic shock ([place]) 06/18/2017   SIRS (systemic inflammatory response syndrome) ([place]) 07/11/2017   Stroke ([place]) 10/2009   TIA   Thrombocytopenia ([place])    Past Surgical History:  Procedure Laterality Date   COLONOSCOPY WITH PROPOFOL N/A 02/07/2016   Procedure: COLONOSCOPY WITH PROPOFOL;  Surgeon: Milus Banister, MD;  Location: [facility];  Service: Endoscopy;  Laterality: N/A;   ESOPHAGOGASTRODUODENOSCOPY (EGD) WITH PROPOFOL N/A 02/07/2016   Procedure: ESOPHAGOGASTRODUODENOSCOPY (EGD) WITH PROPOFOL;  Surgeon: Milus Banister, MD;  Location: [facility];  Service: Endoscopy;  Laterality: N/A;   ESOPHAGOGASTRODUODENOSCOPY (EGD) WITH PROPOFOL N/A 06/22/2017   Procedure: ESOPHAGOGASTRODUODENOSCOPY (EGD) WITH PROPOFOL;  Surgeon: Jerene Bears, MD;  Location: [facility] ENDOSCOPY;  Service: Gastroenterology;  Laterality: N/A;   EXCISIONAL TOTAL KNEE ARTHROPLASTY WITH ANTIBIOTIC SPACERS Right 03/20/2020   Procedure: Resection right total knee arthroplasty and placement of antibiotic spacer;  Surgeon: Paralee Cancel, MD;  Location: [facility];  Service: Orthopedics;  Laterality: Right;  90  mins   HERNIA REPAIR Right    inguinal   INGUINAL HERNIA REPAIR Left 02/08/2021   Procedure: OPEN LEFT INGUINAL HERNIA REPAIR WITH MESH;  Surgeon: Coralie Keens, MD;  Location: [place];  Service: General;  Laterality: Left;   IRRIGATION AND DEBRIDEMENT KNEE Right 11/13/2020   Procedure: IRRIGATION AND DEBRIDEMENT KNEE;  Surgeon: Paralee Cancel, MD;  Location: [facility];  Service: Orthopedics;  Laterality: Right;   KNEE ARTHROSCOPY     bilateral/  12/14   REIMPLANTATION OF TOTAL KNEE Right 08/02/2020   Procedure: REIMPLANTATION/REVISION OF TOTAL KNEE WITH REMOVAL OF ANTIBIOTIC SPACER;  Surgeon: Paralee Cancel, MD;  Location: [facility];  Service: Orthopedics;  Laterality: Right;  89mns   TEE WITHOUT CARDIOVERSION N/A 05/13/2018   Procedure: TRANSESOPHAGEAL ECHOCARDIOGRAM (TEE);  Surgeon: AElouise Munroe MD;  Location: [facility]  Service: Cardiovascular;  Laterality: N/A;   TOTAL KNEE ARTHROPLASTY Right 05/02/2013   Procedure: RIGHT TOTAL KNEE ARTHROPLASTY;  Surgeon: MMauri Pole MD;  Location: [facility];  Service: Orthopedics;  Laterality: Right;   Social History:  reports that he has been smoking cigarettes. He has a 5.00 pack-year smoking history. He has never used smokeless tobacco. He reports current alcohol use. He reports current drug use. Drug: Marijuana.  No Known Allergies  Family History  Problem Relation Age of Onset   Hypertension Father    Diabetes Father    Dementia Mother    Lupus Sister     Prior to Admission medications   Medication Sig Start Date End Date Taking? Authorizing Provider  aspirin EC 81 MG tablet Take 81 mg by mouth in the morning and at bedtime. Swallow whole.    [provider]  docusate sodium (COLACE) 100 MG capsule Take 1 capsule (100 mg total) by mouth 2 (two) times daily. 08/15/20   BBillie Ruddy MD  doxycycline (VIBRA-TABS) 100 MG tablet Take 1 tablet (100 mg total) by mouth 2 (two) times daily. 04/16/22   CMichel Bickers MD  ferrous  sulfate 325 (65 FE) MG tablet Take 1 tablet (325 mg total) by mouth 2 (two) times daily with a meal. Take for two weeks as tolerated. 08/15/20   BBillie Ruddy MD  folic acid (FOLVITE) 1 MG tablet TAKE 1 TABLET(1 MG) BY MOUTH DAILY 04/03/21   BBillie Ruddy MD  furosemide (LASIX) 20 MG tablet TAKE 1 TABLET(20 MG) BY MOUTH TWICE DAILY Patient taking differently: Take 20 mg by mouth 2 (two) times daily before a meal. 05/06/21   BBillie Ruddy MD  methocarbamol (ROBAXIN) 500 MG tablet Take 1 tablet (500 mg total) by mouth every 6 (six) hours as needed for muscle spasms. 11/14/20   DIrving Copas PA-C  nadolol (CORGARD) 20 MG tablet TAKE 1 TABLET BY MOUTH EVERY DAY 11/08/20   BBillie Ruddy MD  pantoprazole (PROTONIX) 40 MG tablet TAKE 1 TABLET BY MOUTH EVERY DAY 04/05/21   BBillie Ruddy MD  polyethylene glycol (MIRALAX / GLYCOLAX) 17 g packet Take 17 g by mouth daily. 11/15/20   KBonnielee Haff MD    Physical Exam: Vitals:   07/07/22 1806  BP: 125/70  Pulse: (!) 102  Resp: 13  Temp: 97.8 F (36.6 C)  TempSrc: Oral  SpO2: 100%    Gen Exam: Although slow to respond-he does respond appropriately-speech is slow but clear.Follows almost all my commands.  Not in any distress. HEENT:atraumatic, normocephalic Chest: B/L clear to auscultation anteriorly CVS:S1S2 regular Abdomen: Soft-slightly distended-some mild dullness in the flanks but no obvious fluid thrill. Extremities:no edema-significantly swollen right knee-but no overlying warmth/erythema. Neurology: -slow to respond but moves all 4 extremities when asked. Skin: no rash  Data Reviewed:    Latest Ref Rng & Units 07/07/2022    6:34 PM 04/01/2022   12:00 AM 03/10/2022    1:42 PM  CBC  WBC 4.0 - 10.5 K/uL 3.6  3.1     4.2   Hemoglobin 13.0 - 17.0 g/dL 10.2  8.3     7.9   Hematocrit 39.0 - 52.0 % 32.9  25     23.3   Platelets 150 - 400 K/uL 172   158.0      This result is from an external source.        Latest Ref  Rng & Units 07/07/2022    6:34 PM 04/01/2022   12:00 AM 03/10/2022    1:42 PM  BMP  Glucose 70 - 99 mg/dL 94   76   BUN 8 - 23 mg/dL '25  13     11   '$ Creatinine 0.61 - 1.24 mg/dL 1.86  1.1     1.06   Sodium 135 - 145 mmol/L 139  136     141   Potassium 3.5 - 5.1 mmol/L 4.2  4.8     4.1   Chloride 98 - 111 mmol/L 107  107     111   CO2 22 - 32 mmol/L '24  17     25   '$ Calcium 8.9 - 10.3 mg/dL 8.6  8.5     7.8      This result is from an external source.     Assessment and Plan: Acute metabolic encephalopathy Obvious concern that this may be acute on chronic right knee prosthetic infection-given similar presentation in the past.  He does have some mild AKI that could be contributing. CT of his brain negative for any acute abnormalities, ammonia levels are within normal limits Plan would be to treat underlying infectious issues, gently hydrate with IVF to see if AKI improves-hopefully treating his underlying issues will improve his encephalopathy if no improvement-further workup can be contemplated.  Will go and check ABG to ensure he does not have hypercarbia-although I doubt. Keeping NPO till he is a bit more awake/alert  Possible acute on chronic right knee prosthetic infection (prior cultures positive for MRSA) Per patient (confirmed by spouse) he does take doxycycline regularly Although right knee is swollen is swollen at baseline-unclear if the swelling is any worse.  There is no overlying warmth-it is not tender when palpated.  Sounds like he has had more pain over the past several days.  Probably will require arthrocentesis after orthopedic evaluation tomorrow morning (ED MD has already spoken with [facility]-they will see patient tomorrow morning).  Since not clear to me whether he actually has a right knee prosthetic infection-will check UA, will order a limited abdominal ultrasound to check for ascites that potentially may need to be tapped to rule out SBP.  Cultures have been  drawn and will need to be followed.UA ordered and currently pending ED-MD is already started the patient on daptomycin/Rocephin-but suspect given that he has had prior MRSA infection-we could just do vancomycin and  have ID evaluate at some point during this hospitalization. Check ESR/CRP  AKI Likely hemodynamically mediated Gently hydrate IVF Renal ultrasound to rule out hydronephrosis/obstructive uropathy If no improvement-we can contemplate further workup and other alternate treatment plan.   Follow electrolyte's-avoid nephrotoxic agents  Liver cirrhosis Likely due to alcohol use Volume status is relatively stable-due to AKI we will hold Lasix Per spouse-he is no longer taking nadolol. He is not on Lactulose for several years now Follow closely for now.  EtOH use Acknowledges drinking atleast one 40 ounce beer daily Unclear when his last drink is He is currently encephalopathy-do not think this is from EtOH withdrawal. Avoid benzos for now-if he does have more overt symptoms of EtOH withdrawal-will need to be started on Ativan per [facility] protocol.  Heroin use Although patient denied drug use to me-his spouse acknowledges that he snorts heroin almost on a daily basis to manage his pain.   Advance Care Planning:   Code Status: DNR (has advanced directives in epic-and subsequently confirmed with spouse-Mark [name] over the phone on admission)  Consults: Ortho  Family Communication: Spouse Mark [name] [phone]  over the phone  Severity of Illness: The appropriate patient status for this patient is INPATIENT. Inpatient status is judged to be reasonable and necessary in order to provide the required intensity of service to ensure the patient's safety. The patient's presenting symptoms, physical exam findings, and initial radiographic and laboratory data in the context of their chronic comorbidities is felt to place them at high risk for further clinical deterioration.  Furthermore, it is not anticipated that the patient will be medically stable for discharge from the hospital within 2 midnights of admission.   * I certify that at the point of admission it is my clinical judgment that the patient will require inpatient hospital care spanning beyond 2 midnights from the point of admission due to high intensity of service, high risk for further deterioration and high frequency of surveillance required.*  Author: Oren Binet, MD 07/07/2022 9:26 PM  For on call review [email or web address].

## 2022-07-07 NOTE — ED Triage Notes (Signed)
Per EMS, pt is coming from home. Pt originally called out for right knee pain. EMS noted that pt was slow to respond to questions, and grandson was concerned for altered mental status. EMS noted HR of 120 on scene.

## 2022-07-07 NOTE — ED Provider Notes (Signed)
[facility] Provider Note  CSN: [id number] Arrival date & time: 07/07/22 1758  Chief Complaint(s) Altered Mental Status and Knee Pain  HPI Mark [name]. is a 67 y.o. male with history of cirrhosis, hypertension, hyperlipidemia, chronic right knee periprosthetic joint infection presenting to the emergency department with confusion.  Patient reportedly called EMS for right knee pain.  Patient reports that his only complaint is right knee pain.  Patient otherwise is somnolent.  History extremely limited due to altered mental status.   Past Medical History Past Medical History:  Diagnosis Date   Alcohol abuse    Anemia    Arthritis    Ascites    Cirrhosis ([place])    Coffee ground emesis    Dehydration 06/17/2017   Febrile illness    GERD (gastroesophageal reflux disease)    Heart murmur    History of blood transfusion    Hyperlipidemia    Hypertension    Leg swelling    Myocardial infarction ([place]) 2012   Preop cardiovascular exam 04/14/2013   Sepsis ([place]) 06/17/2017   Septic shock ([place]) 06/18/2017   SIRS (systemic inflammatory response syndrome) ([place]) 07/11/2017   Stroke ([place]) 10/2009   TIA   Thrombocytopenia ([place])    Patient Active Problem List   Diagnosis Date Noted   Toxic metabolic encephalopathy [id number]   Septic joint of right knee joint ([place]) 11/08/2020   S/P right TKA reimplantation 08/02/2020   Infection of total left knee replacement ([place]) 03/20/2020   Cirrhosis of liver without ascites ([place])    AKI (acute kidney injury) ([place])    Bacteremia    Spinal stenosis of lumbar region without neurogenic claudication    Aspiration pneumonia ([place]) 09/24/2018   Abscess in epidural space of lumbar spine    MRSA bacteremia 09/21/2018   Infection of prosthetic right knee joint ([place]) 09/20/2018   Anemia of chronic disease 09/20/2018   Hypoalbuminemia 09/20/2018   Hypoglycemia without diagnosis of diabetes mellitus  09/20/2018   Epidural abscess 09/20/2018   Hyperkalemia 05/18/2018   Edentulous 05/11/2018   Pancytopenia ([place]) 05/10/2018   CAD (coronary artery disease) 05/10/2018   Hepatic encephalopathy ([place]) 05/10/2018   Alcohol abuse 05/10/2018   GERD (gastroesophageal reflux disease) 05/10/2018   Duodenal ulcer    Renal failure    Acute on chronic anemia    Hypotension 06/17/2017   Hyponatremia 06/17/2017   Leg edema, right [id number]   Acute metabolic encephalopathy [id number]   Anemia, iron deficiency    Benign neoplasm of ascending colon    Hemorrhoids    Portal hypertensive gastropathy ([place])    Gastritis and gastroduodenitis    Esophageal varices without bleeding ([place])    H/O: CVA (cerebrovascular accident) 12/01/2013   ACS (acute coronary syndrome) ([place]) 12/01/2013   Anasarca 12/01/2013   Polysubstance abuse ([place]) 12/01/2013   CKD (chronic kidney disease) stage 3, GFR 30-59 ml/min ([place]) 12/01/2013   S/P right TKA 05/02/2013   Murmur 04/14/2013   Right inguinal hernia 12/26/2010   Cirrhosis, alcoholic ([place]) [id number]   Home Medication(s) Prior to Admission medications   Medication Sig Start Date End Date Taking? Authorizing Provider  aspirin EC 81 MG tablet Take 81 mg by mouth 2 (two) times daily with a meal. Swallow whole.   Yes [provider]  doxycycline (VIBRA-TABS) 100 MG tablet Take 1 tablet (100 mg total) by mouth 2 (two) times daily. 04/16/22  Yes Michel Bickers, MD  ferrous sulfate 325 (65 FE) MG  tablet Take 1 tablet (325 mg total) by mouth 2 (two) times daily with a meal. Take for two weeks as tolerated. Patient taking differently: Take 325 mg by mouth 2 (two) times daily with a meal. 08/15/20  Yes [name], MD  furosemide (LASIX) 20 MG tablet TAKE 1 TABLET(20 MG) BY MOUTH TWICE DAILY Patient taking differently: Take 20 mg by mouth 2 (two) times daily before a meal. 05/06/21  Yes [name], MD  docusate sodium (COLACE) 100 MG capsule Take 1 capsule  (100 mg total) by mouth 2 (two) times daily. Patient not taking: Reported on 07/07/2022 08/15/20   [name], MD  folic acid (FOLVITE) 1 MG tablet TAKE 1 TABLET(1 MG) BY MOUTH DAILY Patient not taking: Reported on 07/07/2022 04/03/21   [name], MD  methocarbamol (ROBAXIN) 500 MG tablet Take 1 tablet (500 mg total) by mouth every 6 (six) hours as needed for muscle spasms. Patient not taking: Reported on 07/07/2022 11/14/20   Irving Copas, PA-C  nadolol (CORGARD) 20 MG tablet TAKE 1 TABLET BY MOUTH EVERY DAY Patient not taking: Reported on 07/07/2022 11/08/20   [name], MD  pantoprazole (PROTONIX) 40 MG tablet TAKE 1 TABLET BY MOUTH EVERY DAY Patient not taking: Reported on 07/07/2022 04/05/21   [name], MD  polyethylene glycol (MIRALAX / GLYCOLAX) 17 g packet Take 17 g by mouth daily. Patient not taking: Reported on 07/07/2022 11/15/20   Bonnielee Haff, MD                                                                                                                                    Past Surgical History Past Surgical History:  Procedure Laterality Date   COLONOSCOPY WITH PROPOFOL N/A 02/07/2016   Procedure: COLONOSCOPY WITH PROPOFOL;  Surgeon: Milus Banister, MD;  Location: [facility];  Service: Endoscopy;  Laterality: N/A;   ESOPHAGOGASTRODUODENOSCOPY (EGD) WITH PROPOFOL N/A 02/07/2016   Procedure: ESOPHAGOGASTRODUODENOSCOPY (EGD) WITH PROPOFOL;  Surgeon: Milus Banister, MD;  Location: [facility];  Service: Endoscopy;  Laterality: N/A;   ESOPHAGOGASTRODUODENOSCOPY (EGD) WITH PROPOFOL N/A 06/22/2017   Procedure: ESOPHAGOGASTRODUODENOSCOPY (EGD) WITH PROPOFOL;  Surgeon: Jerene Bears, MD;  Location: [facility] ENDOSCOPY;  Service: Gastroenterology;  Laterality: N/A;   EXCISIONAL TOTAL KNEE ARTHROPLASTY WITH ANTIBIOTIC SPACERS Right 03/20/2020   Procedure: Resection right total knee arthroplasty and placement of antibiotic spacer;  Surgeon: Paralee Cancel, MD;   Location: [facility] ORS;  Service: Orthopedics;  Laterality: Right;  90 mins   HERNIA REPAIR Right    inguinal   INGUINAL HERNIA REPAIR Left 02/08/2021   Procedure: OPEN LEFT INGUINAL HERNIA REPAIR WITH MESH;  Surgeon: Coralie Keens, MD;  Location: [facility];  Service: General;  Laterality: Left;   IRRIGATION AND DEBRIDEMENT KNEE Right 11/13/2020   Procedure: IRRIGATION AND DEBRIDEMENT KNEE;  Surgeon: Paralee Cancel, MD;  Location: [facility];  Service: Orthopedics;  Laterality:  Right;   KNEE ARTHROSCOPY     bilateral/  12/14   REIMPLANTATION OF TOTAL KNEE Right 08/02/2020   Procedure: REIMPLANTATION/REVISION OF TOTAL KNEE WITH REMOVAL OF ANTIBIOTIC SPACER;  Surgeon: Paralee Cancel, MD;  Location: [facility];  Service: Orthopedics;  Laterality: Right;  58mns   TEE WITHOUT CARDIOVERSION N/A 05/13/2018   Procedure: TRANSESOPHAGEAL ECHOCARDIOGRAM (TEE);  Surgeon: AElouise Munroe MD;  [facility]  Service: Cardiovascular;  Laterality: N/A;   TOTAL KNEE ARTHROPLASTY Right 05/02/2013   Procedure: RIGHT TOTAL KNEE ARTHROPLASTY;  Surgeon: MMauri Pole MD;  [facility];  Service: Orthopedics;  Laterality: Right;   Family History Family History  Problem Relation Age of Onset   Hypertension Father    Diabetes Father    Dementia Mother    Lupus Sister     Social History Social History   Tobacco Use   Smoking status: Some Days    Packs/day: 0.50    Years: 10.00    Total pack years: 5.00    Types: Cigarettes    Last attempt to quit: 01/05/2016    Years since quitting: 6.5   Smokeless tobacco: Never   Tobacco comments:    4 cigarettes a day  Vaping Use   Vaping Use: Never used  Substance Use Topics   Alcohol use: Yes    Comment: 2-3 beers/day   Drug use: Yes    Types: Marijuana   Allergies Patient has no known allergies.  Review of Systems Review of Systems  Unable to perform ROS: Mental status change    Physical Exam Vital Signs  I have reviewed the triage vital signs BP  139/83   Pulse (!) 102   Temp 97.7 F (36.5 C) (Oral)   Resp 12   SpO2 100%  Physical Exam Vitals and nursing note reviewed.  Constitutional:      General: He is not in acute distress.    Appearance: Normal appearance.     Comments: Very somnolent, arouses to painful stimulation  HENT:     Mouth/Throat:     Mouth: Mucous membranes are dry.  Eyes:     Conjunctiva/sclera: Conjunctivae normal.  Cardiovascular:     Rate and Rhythm: Normal rate and regular rhythm.  Pulmonary:     Effort: Pulmonary effort is normal. No respiratory distress.     Breath sounds: Normal breath sounds.  Abdominal:     General: Abdomen is flat.     Palpations: Abdomen is soft.     Tenderness: There is no abdominal tenderness.  Musculoskeletal:     Comments: Right knee swelling and warmth.  Range of motion intact.  Skin:    General: Skin is warm and dry.     Capillary Refill: Capillary refill takes less than 2 seconds.  Neurological:     Comments: Oriented to self, hospital, situation.  Does not know the month.  Psychiatric:        Mood and Affect: Mood normal.        Behavior: Behavior normal.     ED Results and Treatments Labs (all labs ordered are listed, but only abnormal results are displayed) Labs Reviewed  COMPREHENSIVE METABOLIC PANEL - Abnormal; Notable for the following components:      Result Value   BUN 25 (*)    Creatinine, Ser 1.86 (*)    Calcium 8.6 (*)    Total Protein 8.5 (*)    Albumin 3.3 (*)    GFR, Estimated 39 (*)    All  other components within normal limits  CBC - Abnormal; Notable for the following components:   WBC 3.6 (*)    RBC 3.38 (*)    Hemoglobin 10.2 (*)    HCT 32.9 (*)    RDW 15.7 (*)    All other components within normal limits  C-REACTIVE PROTEIN - Abnormal; Notable for the following components:   CRP 3.7 (*)    All other components within normal limits  BLOOD GAS, ARTERIAL - Abnormal; Notable for the following components:   Acid-Base Excess 2.5 (*)     All other components within normal limits  CBC - Abnormal; Notable for the following components:   WBC 2.8 (*)    RBC 2.99 (*)    Hemoglobin 9.2 (*)    HCT 29.8 (*)    RDW 15.8 (*)    Platelets 80 (*)    All other components within normal limits  CREATININE, SERUM - Abnormal; Notable for the following components:   Creatinine, Ser 1.49 (*)    GFR, Estimated 51 (*)    All other components within normal limits  RESP PANEL BY RT-PCR (RSV, FLU A&B, COVID)  RVPGX2  CULTURE, BLOOD (ROUTINE X 2)  CULTURE, BLOOD (ROUTINE X 2)  ETHANOL  LACTIC ACID, PLASMA  TSH  AMMONIA  RAPID URINE DRUG SCREEN, HOSP PERFORMED  URINALYSIS, ROUTINE W REFLEX MICROSCOPIC  SEDIMENTATION RATE  CBC  COMPREHENSIVE METABOLIC PANEL  PROTIME-INR  CBG MONITORING, ED  CBG MONITORING, ED  CBG MONITORING, ED                                                                                                                          Radiology US Abdomen Limited  Result Date: 07/07/2022 CLINICAL DATA:  Assess for ascites EXAM: LIMITED ABDOMEN ULTRASOUND FOR ASCITES TECHNIQUE: Limited ultrasound survey for ascites was performed in all four abdominal quadrants. COMPARISON:  None Available. FINDINGS: No free fluid identified in the 4 abdominal quadrants. IMPRESSION: No ascites. Electronically Signed   By: Ronney Asters M.D.   On: 07/07/2022 22:34   US RENAL  Result Date: 07/07/2022 CLINICAL DATA:  [id number] AKI (acute kidney injury) ([place]) [id number] EXAM: RENAL / URINARY TRACT ULTRASOUND COMPLETE COMPARISON:  CT abdomen pelvis 06/17/2017, ultrasound renal 06/17/2017 FINDINGS: Right Kidney: Renal measurements: 9.4 x 4.3 x 4.1 cm = volume: 87 mL. Echogenicity within normal limits. No mass or hydronephrosis visualized. Left Kidney: Renal measurements: 9.3 x 4.1 x 5.1 cm = volume: 109 mL. Echogenicity within normal limits. A stable simple cystic lesion measuring up to 3.3 cm. Stable in size 1.1 x 1 x 1 cm left superior renal pole  hypodensity. Hydronephrosis visualized. Urinary bladder: Appears normal for degree of bladder distention. Other: None. IMPRESSION: 1. Indeterminate stable in size 1.1 x 1 x 1 cm left superior renal pole hypodensity. Query simple cystic lesion with no definite increased through transmission identified. Consider 3 month outpatient follow-up ultrasound for further evaluation. 2. Otherwise unremarkable renal ultrasound. Electronically Signed  By: Iven Finn M.D.   On: 07/07/2022 22:33   DG Knee Complete 4 Views Right  Result Date: 07/07/2022 CLINICAL DATA:  Right knee pain EXAM: RIGHT KNEE - COMPLETE 4+ VIEW COMPARISON:  02/26/2022 FINDINGS: Frontal, bilateral oblique, and lateral views of the right knee are obtained on 4 images. 3 component right knee arthroplasty is again identified, with no significant change in the alignment of the orthopedic hardware. Resorptive changes surrounding the proximal tibia and distal femur along the margins of the orthopedic hardware again noted, concerning for prosthesis infection. There has been moderate improvement in the subcutaneous edema seen previously surrounding the right knee. Persistent moderate right knee effusion. There are no acute displaced fractures. IMPRESSION: 1. Persistent periprosthetic lucency of the distal femur and proximal tibia, with associated soft tissue swelling and moderate joint effusion. 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 2. No acute fracture. Electronically Signed   By: Randa Ngo M.D.   On: 07/07/2022 19:49   DG Chest Port 1 View  Result Date: 07/07/2022 CLINICAL DATA:  Weakness, right knee pain EXAM: PORTABLE CHEST 1 VIEW COMPARISON:  02/26/2022 FINDINGS: The heart size and mediastinal contours are within normal limits. Both lungs are clear. The visualized skeletal structures are unremarkable. IMPRESSION: No active disease. Electronically Signed   By: Randa Ngo M.D.   On: 07/07/2022 19:46   CT Head  Wo Contrast  Result Date: 07/07/2022 CLINICAL DATA:  Confusion, altered level of consciousness EXAM: CT HEAD WITHOUT CONTRAST TECHNIQUE: Contiguous axial images were obtained from the base of the skull through the vertex without intravenous contrast. RADIATION DOSE REDUCTION: This exam was performed according to the departmental dose-optimization program which includes automated exposure control, adjustment of the mA and/or kV according to patient size and/or use of iterative reconstruction technique. COMPARISON:  02/26/2022 FINDINGS: Brain: No acute infarct or hemorrhage. Lateral ventricles and midline structures are unremarkable. No acute extra-axial fluid collections. No mass effect. Vascular: No hyperdense vessel or unexpected calcification. Skull: Normal. Negative for fracture or focal lesion. Sinuses/Orbits: Mild mucosal thickening within the right maxillary sinus and sphenoid sinus. Other: None. IMPRESSION: 1. No acute intracranial process. Electronically Signed   By: Randa Ngo M.D.   On: 07/07/2022 19:45    Pertinent labs & imaging results that were available during my care of the patient were reviewed by me and considered in my medical decision making (see MDM for details).  Medications Ordered in ED Medications  vancomycin (VANCOREADY) IVPB 1250 mg/250 mL (1,250 mg Intravenous New Bag/Given 07/07/22 2242)  aspirin EC tablet 81 mg (has no administration in time range)  ferrous sulfate tablet 325 mg (has no administration in time range)  enoxaparin (LOVENOX) injection 40 mg (40 mg Subcutaneous Given 07/07/22 2146)  lactated ringers infusion ( Intravenous New Bag/Given 07/07/22 2240)  ondansetron (ZOFRAN) tablet 4 mg (has no administration in time range)    Or  ondansetron (ZOFRAN) injection 4 mg (has no administration in time range)  albuterol (PROVENTIL) (2.5 MG/3ML) 0.083% nebulizer solution 2.5 mg (has no administration in time range)  lactated ringers bolus 1,500 mL (1,500 mLs  Intravenous Bolus 07/07/22 1939)  Procedures Procedures  (including critical care time)  Medical Decision Making / ED Course   MDM:  67 year old male presenting to the emergency department with right knee pain and confusion.  Patient very somnolent, difficult to arouse so history limited.  No family at bedside.  He does report right knee pain.  On exam he does have significant swelling and warmth to his right knee.  On review of chart he has had previous ER visits for right knee infection.  He has had intermittent compliance with his suppressive doxycycline for his periprosthetic joint infection.  Given warmth and swelling suspect likely recurrent periprosthetic infection.  X-ray shows findings concerning for persistent periprosthetic infection.  CT scan of the head without evidence of acute process.  Labs notable for acute kidney injury.  Discussed with orthopedic physician Dr. Stann Mainland who agrees with admission. Will cover for chronic infection. Discussed with Dr. Sloan Leiter who will admit patient.Admitted to hospital.       Additional history obtained: -Additional history obtained from ems -External records from outside source obtained and reviewed including: Chart review including previous notes, labs, imaging, consultation notes including     Lab Tests: -I ordered, reviewed, and interpreted labs.   The pertinent results include:   Labs Reviewed  COMPREHENSIVE METABOLIC PANEL - Abnormal; Notable for the following components:      Result Value   BUN 25 (*)    Creatinine, Ser 1.86 (*)    Calcium 8.6 (*)    Total Protein 8.5 (*)    Albumin 3.3 (*)    GFR, Estimated 39 (*)    All other components within normal limits  CBC - Abnormal; Notable for the following components:   WBC 3.6 (*)    RBC 3.38 (*)    Hemoglobin 10.2 (*)    HCT 32.9 (*)    RDW 15.7  (*)    All other components within normal limits  C-REACTIVE PROTEIN - Abnormal; Notable for the following components:   CRP 3.7 (*)    All other components within normal limits  BLOOD GAS, ARTERIAL - Abnormal; Notable for the following components:   Acid-Base Excess 2.5 (*)    All other components within normal limits  CBC - Abnormal; Notable for the following components:   WBC 2.8 (*)    RBC 2.99 (*)    Hemoglobin 9.2 (*)    HCT 29.8 (*)    RDW 15.8 (*)    Platelets 80 (*)    All other components within normal limits  CREATININE, SERUM - Abnormal; Notable for the following components:   Creatinine, Ser 1.49 (*)    GFR, Estimated 51 (*)    All other components within normal limits  RESP PANEL BY RT-PCR (RSV, FLU A&B, COVID)  RVPGX2  CULTURE, BLOOD (ROUTINE X 2)  CULTURE, BLOOD (ROUTINE X 2)  ETHANOL  LACTIC ACID, PLASMA  TSH  AMMONIA  RAPID URINE DRUG SCREEN, HOSP PERFORMED  URINALYSIS, ROUTINE W REFLEX MICROSCOPIC  SEDIMENTATION RATE  CBC  COMPREHENSIVE METABOLIC PANEL  PROTIME-INR  CBG MONITORING, ED  CBG MONITORING, ED  CBG MONITORING, ED    Notable for acute kidney injury  EKG   EKG Interpretation  Date/Time:  Monday July 07 2022 18:10:35 EDT Ventricular Rate:  100 PR Interval:  169 QRS Duration: 80 QT Interval:  383 QTC Calculation: 494 R Axis:   54 Text Interpretation: Sinus tachycardia Anterior infarct, old Confirmed by Garnette Gunner [phone]) on 07/07/2022 7:40:21 PM  Imaging Studies ordered: I ordered imaging studies including XR knee On my interpretation imaging demonstrates concern for periprostheic infection I independently visualized and interpreted imaging. I agree with the radiologist interpretation   Medicines ordered and prescription drug management: Meds ordered this encounter  Medications   lactated ringers bolus 1,500 mL   DISCONTD: cefTRIAXone (ROCEPHIN) 2 g in sodium chloride 0.9 % 100 mL IVPB    Order Specific  Question:   Antibiotic Indication:    Answer:   Other Indication (list below)    Order Specific Question:   Other Indication:    Answer:   joint   vancomycin (VANCOREADY) IVPB 1250 mg/250 mL    Order Specific Question:   Indication:    Answer:   Other Indication (list below)    Order Specific Question:   Other Indication:    Answer:   knee infection   DISCONTD: vancomycin (VANCOCIN) IVPB 1000 mg/200 mL premix    Order Specific Question:   Indication:    Answer:   Other Indication (list below)    Order Specific Question:   Other Indication:    Answer:   knee infection   aspirin EC tablet 81 mg    Swallow whole.     ferrous sulfate tablet 325 mg    Take for two weeks as tolerated.     DISCONTD: folic acid (FOLVITE) tablet 1 mg    TAKE 1 TABLET(1 MG) BY MOUTH DAILY     DISCONTD: pantoprazole (PROTONIX) EC tablet 40 mg   enoxaparin (LOVENOX) injection 40 mg   lactated ringers infusion   OR Linked Order Group    ondansetron (ZOFRAN) tablet 4 mg    ondansetron (ZOFRAN) injection 4 mg   albuterol (PROVENTIL) (2.5 MG/3ML) 0.083% nebulizer solution 2.5 mg    -I have reviewed the patients home medicines and have made adjustments as needed   Consultations Obtained: I requested consultation with the orthopedist,  and discussed lab and imaging findings as well as pertinent plan - they recommend: admission to hospital   Cardiac Monitoring: The patient was maintained on a cardiac monitor.  I personally viewed and interpreted the cardiac monitored which showed an underlying rhythm of: NSR  Social Determinants of Health:  Diagnosis or treatment significantly limited by social determinants of health: current smoker   Reevaluation: After the interventions noted above, I reevaluated the patient and found that their symptoms have improved  Co morbidities that complicate the patient evaluation  Past Medical History:  Diagnosis Date   Alcohol abuse    Anemia    Arthritis    Ascites     Cirrhosis ([place])    Coffee ground emesis    Dehydration 06/17/2017   Febrile illness    GERD (gastroesophageal reflux disease)    Heart murmur    History of blood transfusion    Hyperlipidemia    Hypertension    Leg swelling    Myocardial infarction ([place]) 2012   Preop cardiovascular exam 04/14/2013   Sepsis ([place]) 06/17/2017   Septic shock ([place]) 06/18/2017   SIRS (systemic inflammatory response syndrome) ([place]) 07/11/2017   Stroke ([place]) 10/2009   TIA   Thrombocytopenia ([name])       Dispostion: Disposition decision including need for hospitalization was considered, and patient admitted to the hospital.    Final Clinical Impression(s) / ED Diagnoses Final diagnoses:  Metabolic encephalopathy  Infection of prosthetic joint, initial encounter [facility])     This chart was dictated using voice recognition software.  Despite best  efforts to proofread,  errors can occur which can change the documentation meaning.    Cristie Hem, MD 07/07/22 2352

## 2022-07-08 ENCOUNTER — Encounter (HOSPITAL_COMMUNITY): Payer: Self-pay | Admitting: Internal Medicine

## 2022-07-08 ENCOUNTER — Other Ambulatory Visit: Payer: Self-pay

## 2022-07-08 DIAGNOSIS — N281 Cyst of kidney, acquired: Secondary | ICD-10-CM

## 2022-07-08 DIAGNOSIS — G9341 Metabolic encephalopathy: Secondary | ICD-10-CM | POA: Diagnosis not present

## 2022-07-08 DIAGNOSIS — N179 Acute kidney failure, unspecified: Secondary | ICD-10-CM | POA: Diagnosis not present

## 2022-07-08 DIAGNOSIS — F1010 Alcohol abuse, uncomplicated: Secondary | ICD-10-CM

## 2022-07-08 DIAGNOSIS — E43 Unspecified severe protein-calorie malnutrition: Secondary | ICD-10-CM | POA: Insufficient documentation

## 2022-07-08 DIAGNOSIS — K7030 Alcoholic cirrhosis of liver without ascites: Secondary | ICD-10-CM | POA: Diagnosis not present

## 2022-07-08 DIAGNOSIS — T8453XA Infection and inflammatory reaction due to internal right knee prosthesis, initial encounter: Principal | ICD-10-CM

## 2022-07-08 DIAGNOSIS — I679 Cerebrovascular disease, unspecified: Secondary | ICD-10-CM

## 2022-07-08 DIAGNOSIS — M25561 Pain in right knee: Secondary | ICD-10-CM | POA: Diagnosis not present

## 2022-07-08 LAB — COMPREHENSIVE METABOLIC PANEL
ALT: 11 U/L (ref 0–44)
AST: 23 U/L (ref 15–41)
Albumin: 3.1 g/dL — ABNORMAL LOW (ref 3.5–5.0)
Alkaline Phosphatase: 84 U/L (ref 38–126)
Anion gap: 10 (ref 5–15)
BUN: 25 mg/dL — ABNORMAL HIGH (ref 8–23)
CO2: 24 mmol/L (ref 22–32)
Calcium: 8.7 mg/dL — ABNORMAL LOW (ref 8.9–10.3)
Chloride: 108 mmol/L (ref 98–111)
Creatinine, Ser: 1.64 mg/dL — ABNORMAL HIGH (ref 0.61–1.24)
GFR, Estimated: 45 mL/min — ABNORMAL LOW (ref >60)
Glucose, Bld: 69 mg/dL — ABNORMAL LOW (ref 70–99)
Potassium: 4.4 mmol/L (ref 3.5–5.1)
Sodium: 142 mmol/L (ref 135–145)
Total Bilirubin: 1 mg/dL (ref 0.3–1.2)
Total Protein: 7.8 g/dL (ref 6.5–8.1)

## 2022-07-08 LAB — CBC
HCT: 27.7 % — ABNORMAL LOW (ref 39.0–52.0)
Hemoglobin: 8.6 g/dL — ABNORMAL LOW (ref 13.0–17.0)
MCH: 30.6 pg (ref 26.0–34.0)
MCHC: 31 g/dL (ref 30.0–36.0)
MCV: 98.6 fL (ref 80.0–100.0)
Platelets: 170 10*3/uL (ref 150–400)
RBC: 2.81 MIL/uL — ABNORMAL LOW (ref 4.22–5.81)
RDW: 15.8 % — ABNORMAL HIGH (ref 11.5–15.5)
WBC: 3.6 10*3/uL — ABNORMAL LOW (ref 4.0–10.5)
nRBC: 0 % (ref 0.0–0.2)

## 2022-07-08 LAB — MRSA NEXT GEN BY PCR, NASAL: MRSA by PCR Next Gen: NOT DETECTED

## 2022-07-08 LAB — PROTIME-INR
INR: 1.4 — ABNORMAL HIGH (ref 0.8–1.2)
Prothrombin Time: 16.8 seconds — ABNORMAL HIGH (ref 11.4–15.2)

## 2022-07-08 LAB — SEDIMENTATION RATE: Sed Rate: 130 mm/hr — ABNORMAL HIGH (ref 0–16)

## 2022-07-08 MED ORDER — VANCOMYCIN HCL IN DEXTROSE 1-5 GM/200ML-% IV SOLN: 1000.0000 mg | INTRAVENOUS | Status: DC

## 2022-07-08 MED ORDER — NADOLOL 20 MG PO TABS
20.0000 mg | ORAL_TABLET | Freq: Every day | ORAL | Status: DC
  Administered 2022-07-08 – 2022-07-09 (×2): 20 mg via ORAL
  Filled 2022-07-08 (×2): qty 1

## 2022-07-08 MED ORDER — CHLORHEXIDINE GLUCONATE CLOTH 2 % EX PADS
6.0000 | MEDICATED_PAD | Freq: Every day | CUTANEOUS | Status: DC
  Administered 2022-07-08: 6 via TOPICAL

## 2022-07-08 MED ORDER — FOLIC ACID 1 MG PO TABS
1.0000 mg | ORAL_TABLET | Freq: Every day | ORAL | Status: DC
  Administered 2022-07-08 – 2022-07-09 (×2): 1 mg via ORAL
  Filled 2022-07-08 (×2): qty 1

## 2022-07-08 MED ORDER — MUPIROCIN 2 % EX OINT
1.0000 | TOPICAL_OINTMENT | Freq: Two times a day (BID) | CUTANEOUS | Status: DC
  Administered 2022-07-08 – 2022-07-09 (×3): 1 via NASAL
  Filled 2022-07-08: qty 22

## 2022-07-08 MED ORDER — THIAMINE HCL 100 MG/ML IJ SOLN: 100.0000 mg | Freq: Every day | INTRAMUSCULAR | Status: DC

## 2022-07-08 MED ORDER — ADULT MULTIVITAMIN W/MINERALS CH
1.0000 | ORAL_TABLET | Freq: Every day | ORAL | Status: DC
  Administered 2022-07-09: 1 via ORAL
  Filled 2022-07-08: qty 1

## 2022-07-08 MED ORDER — ENSURE ENLIVE PO LIQD
237.0000 mL | Freq: Three times a day (TID) | ORAL | Status: DC
  Administered 2022-07-08 – 2022-07-09 (×4): 237 mL via ORAL

## 2022-07-08 MED ORDER — DEXTROSE 5 % IV SOLN: INTRAVENOUS | Status: DC

## 2022-07-08 MED ORDER — SODIUM CHLORIDE 0.9 % IV SOLN
500.0000 mg | Freq: Every day | INTRAVENOUS | Status: DC
  Administered 2022-07-08: 500 mg via INTRAVENOUS
  Filled 2022-07-08: qty 10

## 2022-07-08 MED ORDER — THIAMINE MONONITRATE 100 MG PO TABS
100.0000 mg | ORAL_TABLET | Freq: Every day | ORAL | Status: DC
  Administered 2022-07-08 – 2022-07-09 (×2): 100 mg via ORAL
  Filled 2022-07-08 (×2): qty 1

## 2022-07-08 MED ORDER — LORAZEPAM 1 MG PO TABS: 1.0000 mg | ORAL_TABLET | ORAL | Status: DC | PRN

## 2022-07-08 NOTE — Assessment & Plan Note (Signed)
No active withdrawal.  Drinks 40 oz beer daily - CIWA with on demand lorazepam - Thiamine and folate

## 2022-07-08 NOTE — Hospital Course (Addendum)
Mr. Bibeau is a 67 y.o. M with alcohol cirrhosis, ongoing use, HTN, HLD, prior CVA, hx right knee infected prosthesis who presented with few days increased weakness, then right knee pain, confusion.  In the ER, there was suspicion for recurrent prosthetic infection, so Orthopedics were consulted.  ID consulted.

## 2022-07-08 NOTE — Plan of Care (Signed)
  Problem: Clinical Measurements: Goal: Ability to maintain clinical measurements within normal limits will improve Outcome: Progressing   Problem: Elimination: Goal: Will not experience complications related to bowel motility Outcome: Progressing   Problem: Elimination: Goal: Will not experience complications related to urinary retention Outcome: Progressing   Problem: Pain Managment: Goal: General experience of comfort will improve Outcome: Progressing

## 2022-07-08 NOTE — Assessment & Plan Note (Signed)
As evidenced by severe diffuse loss of subcutaneous muscle mass and fat.   - Ensure - COnsult dietitian

## 2022-07-08 NOTE — Assessment & Plan Note (Signed)
Wife reported patient snorting heroin most days. - UDS - TOC consult

## 2022-07-08 NOTE — Assessment & Plan Note (Signed)
[place] 0.8, here up to 1.8 on admission.  Probably pre-renal - Hold furosemide - Continue IV fluids today

## 2022-07-08 NOTE — TOC Initial Note (Signed)
Transition of Care (TOC) - Initial/Assessment Note    Patient Details  Name: Mark [name]. MRN: [id number] Date of Birth: 1955/09/26  Transition of Care [facility]) CM/SW Contact:    Illene Regulus, LCSW Phone Number: 07/08/2022, 4:25 PM  Clinical Narrative:                 CSW spoke with pt regarding substance abuse resources. Pt has declined resources. TOC to follow for d/c needs.  Expected Discharge Plan: Home/Self Care Barriers to Discharge: Continued Medical Work up   Patient Goals and CMS Choice Patient states their goals for this hospitalization and ongoing recovery are:: retrun home          Expected Discharge Plan and Services       Living arrangements for the past 2 months: Single Family Home                                      Prior Living Arrangements/Services Living arrangements for the past 2 months: Single Family Home Lives with:: Spouse Patient language and need for interpreter reviewed:: Yes Do you feel safe going back to the place where you live?: Yes      Need for Family Participation in Patient Care: No (Comment) Care giver support system in place?: No (comment)   Criminal Activity/Legal Involvement Pertinent to Current Situation/Hospitalization: No - Comment as needed  Activities of Daily Living Home Assistive Devices/Equipment: Crutches ADL Screening (condition at time of admission) Patient's cognitive ability adequate to safely complete daily activities?: No Is the patient deaf or have difficulty hearing?: No Does the patient have difficulty seeing, even when wearing glasses/contacts?: No Does the patient have difficulty concentrating, remembering, or making decisions?: No Patient able to express need for assistance with ADLs?: Yes Does the patient have difficulty dressing or bathing?: Yes Independently performs ADLs?: No Communication: Independent Dressing (OT): Needs assistance, Dependent Is this a change from  baseline?: Change from baseline, expected to last >3 days Grooming: Needs assistance, Dependent Feeding: Independent Bathing: Dependent, Needs assistance Is this a change from baseline?: Change from baseline, expected to last >3 days Toileting: Independent In/Out Bed: Dependent, Needs assistance Is this a change from baseline?: Change from baseline, expected to last >3 days Walks in Home: Independent with device (comment) (uses crutches) Is this a change from baseline?: Change from baseline, expected to last >3 days Does the patient have difficulty walking or climbing stairs?: Yes Weakness of Legs: Right Weakness of Arms/Hands: Both  Permission Sought/Granted                  Emotional Assessment Appearance:: Appears older than stated age   Affect (typically observed): Agitated Orientation: : Oriented to Self, Oriented to  Time, Oriented to Place Alcohol / Substance Use: Illicit Drugs Psych Involvement: No (comment)  Admission diagnosis:  Metabolic encephalopathy 99991111 Acute metabolic encephalopathy 99991111 Infection of prosthetic joint, initial encounter [facility]) [T84.50XA] Patient Active Problem List   Diagnosis Date Noted   Right knee pain 07/08/2022   Cerebrovascular disease 07/08/2022   Renal cyst 07/08/2022   Protein-calorie malnutrition, severe [id number]   Toxic metabolic encephalopathy A999333   Septic joint of right knee joint ([place]) 11/08/2020   S/P right TKA reimplantation 08/02/2020   Infection of total left knee replacement ([place]) 03/20/2020   Cirrhosis of liver without ascites ([place])    AKI (acute kidney injury) ([place])    Bacteremia  Spinal stenosis of lumbar region without neurogenic claudication    Aspiration pneumonia ([place]) 09/24/2018   Abscess in epidural space of lumbar spine    MRSA bacteremia 09/21/2018   Infection of prosthetic right knee joint ([place]) 09/20/2018   Anemia of chronic disease 09/20/2018   Hypoalbuminemia 09/20/2018    Hypoglycemia without diagnosis of diabetes mellitus 09/20/2018   Epidural abscess 09/20/2018   Hyperkalemia 05/18/2018   Edentulous 05/11/2018   Pancytopenia ([place]) 05/10/2018   CAD (coronary artery disease) 05/10/2018   Hepatic encephalopathy ([place]) 05/10/2018   Alcohol abuse 05/10/2018   GERD (gastroesophageal reflux disease) 05/10/2018   Duodenal ulcer    Renal failure    Acute on chronic anemia    Hypotension 06/17/2017   Hyponatremia 06/17/2017   Leg edema, right [id number]   Acute metabolic encephalopathy A999333   Anemia, iron deficiency    Benign neoplasm of ascending colon    Hemorrhoids    Portal hypertensive gastropathy ([place])    Gastritis and gastroduodenitis    Esophageal varices without bleeding ([place])    H/O: CVA (cerebrovascular accident) 12/01/2013   ACS (acute coronary syndrome) ([place]) 12/01/2013   Anasarca 12/01/2013   Polysubstance abuse ([place]) 12/01/2013   CKD (chronic kidney disease) stage 3, GFR 30-59 ml/min ([place]) 12/01/2013   S/P right TKA 05/02/2013   Murmur 04/14/2013   Right inguinal hernia 12/26/2010   Cirrhosis, alcoholic ([place]) [id number]   PCP:  [name], MD Pharmacy:   [facility] [name], [place] - [address] [place] [address] Phone: [phone] [phone]     Social Determinants of Health (SDOH) Social History: SDOH Screenings   Food Insecurity: Unknown (02/28/2022)  Housing: Low Risk  (02/28/2022)  Transportation Needs: Unknown (02/28/2022)  Utilities: Unknown (02/28/2022)  Alcohol Screen: Low Risk  (03/14/2020)  Depression (PHQ2-9): Low Risk  (04/16/2022)  Financial Resource Strain: Low Risk  (05/16/2021)  Physical Activity: Sufficiently Active (05/16/2021)  Social Connections: Socially Integrated (05/16/2021)  Stress: No Stress Concern Present (05/16/2021)  Tobacco Use: High Risk (07/08/2022)   SDOH Interventions:     Readmission Risk Interventions     03/03/2022   12:29 PM 08/03/2020   11:48 AM  Readmission Risk Prevention Plan  Transportation Screening Complete Complete  PCP or Specialist Appt within 3-5 Days Complete   HRI or [place] Complete Complete  Social Work Consult for [place] Planning/Counseling Complete Complete  Palliative Care Screening Not Applicable Not Applicable  Medication Review Press photographer) Referral to Pharmacy Complete

## 2022-07-08 NOTE — Progress Notes (Signed)
Initial Nutrition Assessment  DOCUMENTATION CODES:   Severe malnutrition in context of chronic illness  INTERVENTION:  - 2 gram sodium diet per MD. - Ensure Plus High Protein po BID, each supplement provides 350 kcal and 20 grams of protein. - Encourage intake of 3 meals a day in addition to supplements.  - Multivitamin with minerals daily. - Monitor weight trends.    NUTRITION DIAGNOSIS:   Severe Malnutrition related to chronic illness as evidenced by severe fat depletion, severe muscle depletion, energy intake < 75% for > or equal to 1 month.  GOAL:   Patient will meet greater than or equal to 90% of their needs   MONITOR:   PO intake, Supplement acceptance, Weight trends  REASON FOR ASSESSMENT:   Malnutrition Screening Tool    ASSESSMENT:   67 y.o. male with PMH significant of right knee prosthetic infection with MRSA, liver cirrhosis, ongoing EtOH use, ongoing heroin use  who presented with 3-4-day history of lethargy, frequent falls, worsening right knee pain preventing ambulation.  Patient noted to present with encephalopathy but patient alert during visit and answered all questions appropriately.  Endorsed a UBW of 145# and feels he has likely lost some weight over the past few weeks.  Per EMR, patient weighing between 124-127# from January 2023 to November 2023. Weighed at 151# in November however given previous history feel this may be inaccurate. Two most recent weights put patient at 130#.   Patient endorses eating very little the past 1-2 months due to having all his teeth pulled and having taste changes as a result. Denies chewing problems after teeth pulled, just reports nothing tastes the same. Appetite has also been very poor as a result.  He has been occasionally drinking Ensure but notes it can be expensive. Provides example that if he had a case of Ensure consistently he could likely drink it in 3-4 days.   Patient NPO at time of visit (diet now advanced)  but feeling like he could eat something. He is agreeable to receive Ensure during admission.     Medications reviewed and include: '325mg'$  iron, D5 @ 74m/hr (provides 306 kcals over 24 hours)  Labs reviewed:  Creatinine 1.64   NUTRITION - FOCUSED PHYSICAL EXAM:  Flowsheet Row Most Recent Value  Orbital Region Severe depletion  Upper Arm Region Severe depletion  Thoracic and Lumbar Region Severe depletion  Buccal Region Severe depletion  Temple Region Severe depletion  Clavicle Bone Region Severe depletion  Clavicle and Acromion Bone Region Severe depletion  Scapular Bone Region Unable to assess  Dorsal Hand Moderate depletion  Patellar Region Severe depletion  Anterior Thigh Region Severe depletion  Posterior Calf Region Severe depletion  Edema (RD Assessment) None  Hair Reviewed  Eyes Reviewed  Mouth Reviewed  Skin Reviewed [[place] Pippinsdry]  Nails Reviewed       Diet Order:   Diet Order             Diet 2 gram sodium Room service appropriate? Yes; Fluid consistency: Thin  Diet effective now                   EDUCATION NEEDS:  Education needs have been addressed  Skin:  Skin Assessment: Reviewed RN Assessment  Last BM:  3/11  Height:  Ht Readings from Last 1 Encounters:  07/08/22 '5\' 8"'$  (1.727 m)   Weight:  Wt Readings from Last 1 Encounters:  07/08/22 59.2 kg    BMI:  Body mass index is 19.84  kg/m.  Estimated Nutritional Needs:  Kcal:  1950-2150 kcals Protein:  90-105 grams Fluid:  >/= 1.9L    Samson Frederic RD, LDN For contact information, refer to [facility].

## 2022-07-08 NOTE — Progress Notes (Signed)
Progress Note   Patient: Mark [name]. [id number] DOB: April 10, 1956 DOA: 07/07/2022     1 DOS: the patient was seen and examined on 07/08/2022 at 10:45AM      Brief hospital course: Mr. Kuchera is a 67 y.o. M with alcohol cirrhosis, ongoing use, HTN, HLD, prior CVA, hx right knee infected prosthesis who presented with few days increased weakness, then right knee pain, confusion.  In the ER, there was suspicion for recurrent prosthetic infection, so Orthopedics were consulted.  ID consulted.     Assessment and Plan: * Right knee pain Radiograph shows lucency c/w ongoing infection.  D/w Orthopedics, Dr. Alvan Dame, patient unfortunately has no further surgical options other than amputation.  D/w ID, if patient refused amputation, they would consider palliative/suppressive long-term antibiotics if we had aspirate culture. - Consult Orthopedics, appreciate cares - Consult ID, appreciate cares - If patient wishes to wait on amputation or refuses outright, an aspirate would be helpful to direct palliative ABx  - Continue Daptomycin for now      Acute metabolic encephalopathy At baseline, lives at home, no significant memory issues, independent for self cares.  On admission, was less alert, more confused than baseline per family.  This appears somewhat better with treatment in the hospital.  TSH, ammonia, ABG normal.  I suspect it is a constitutional reflection of his systemic illness (most likely knee infection, although work up is pending), doubt stroke (no focal signs). - Standard delirium precautions: blinds open and lights on during day, TV off, minimize interruptions at night, glasses/hearing aids, PT/OT, avoiding [place] list medications       Cirrhosis, alcoholic ([name]) MELD 15, relatively compensated at present. - Resume nadolol - Hold furosemide given AKI  Protein-calorie malnutrition, severe As evidenced by severe diffuse loss of subcutaneous muscle mass and fat.    - Ensure - COnsult dietitian  Renal cyst Incidental finding, nature unclear - Depending on goals of care, patient would need outpatient MRI for better delineation of this  Cerebrovascular disease - Hold aspirin  AKI (acute kidney injury) ([place]) Baesline 0.8, here up to 1.8 on admission.  Probably pre-renal - Hold furosemide - Continue IV fluids today  Alcohol abuse No active withdrawal.  Drinks 40 oz beer daily - CIWA with on demand lorazepam - Thiamine and folate  Polysubstance abuse ([place]) Wife reported patient snorting heroin most days. - UDS - TOC consult          Subjective: Says he is not confused, feels sliglthy better, just has knee pain and generalized weakness.  No fever, no URI symptoms, no cough, fever, no abdominal pain vomiting, no diarrhea, no dysuria, urinary symptoms, no sores on his backside.     Physical Exam: BP 130/79 (BP Location: Right Arm)   Pulse 91   Temp 97.8 F (36.6 C) (Oral)   Resp 16   Ht '5\' 8"'$  (1.727 m)   Wt 59.2 kg   SpO2 100%   BMI 19.84 kg/m   Thin elderly adult male, appears chronically malnourished and edentulous Slightly tachycardic, regular, no murmurs, no JVD, no peripheral edema Respiratory rate seems normal, lungs clear without rales or wheezes Abdomen soft without tenderness palpation or guarding, no ascites or distention Right knee is chronically deformed, actually do not feel an effusion, I do not feel much point tenderness, passive and active range of motion is limited but not not particularly tender Attention normal, affect irritable, oriented to "hospital", "[facility]", "March", and "probably Tuesday".  Moves upper extremities with  generalized weakness with symmetric strength    Data Reviewed: Discussed with infectious disease and orthopedics Renal ultrasound unremarkable, small cystic disease of MRI follow-up Abdomen ultrasound ruled out ascites, doubt SBP Creatinine improved from 1.8 down to 1.6 Glucose  low this morning ABG normal Hemoglobin 8.6    Family Communication: None present    Disposition: Status is: Inpatient The patient was admitted with knee pain and encephalopathy  Encephalopathy appears resolved, but it appears that the overall syndrome is related to persistent or recurrent infection in his right prosthetic knee  He does not have any surgical options, surgery will evaluate him, infectious disease are also involved.        Author: Edwin Dada, MD 07/08/2022 1:48 PM  For on call review [email or web address].

## 2022-07-08 NOTE — Assessment & Plan Note (Addendum)
Radiograph shows lucency c/w ongoing infection.  D/w Orthopedics, Dr. Alvan Dame, patient unfortunately has no further surgical options other than amputation.  D/w ID, if patient refused amputation, they would consider palliative/suppressive long-term antibiotics if we had aspirate culture. - Consult Orthopedics, appreciate cares - Consult ID, appreciate cares - If patient wishes to wait on amputation or refuses outright, an aspirate would be helpful to direct palliative ABx  - Continue Daptomycin for now

## 2022-07-08 NOTE — Assessment & Plan Note (Signed)
Incidental finding, nature unclear - Depending on goals of care, patient would need outpatient MRI for better delineation of this

## 2022-07-08 NOTE — Assessment & Plan Note (Addendum)
MELD 15, relatively compensated at present. - Resume nadolol - Hold furosemide given AKI

## 2022-07-08 NOTE — Plan of Care (Signed)
  Problem: Education: Goal: Knowledge of General Education information will improve Description: Including pain rating scale, medication(s)/side effects and non-pharmacologic comfort measures Outcome: Progressing   Problem: Nutrition: Goal: Adequate nutrition will be maintained Outcome: Not Progressing Note: Pt is currently NPO except for sips with meds

## 2022-07-08 NOTE — Assessment & Plan Note (Signed)
Hold aspirin

## 2022-07-08 NOTE — Consult Note (Signed)
Reason for Consult: right knee pain, history of chronic septic knee arthroplasty Referring Physician: Loleta Books, MD  Mark [name]. is an 68 y.o. male.  HPI: Mark [name] is a 67 y.o. M with alcohol cirrhosis, ongoing use, HTN, HLD, prior CVA, hx right knee infected prosthesis who presented with few days increased weakness, then right knee pain, confusion.   During my discussion with him this evening he states that he had passed out.  He recalls getting ready to visit a friend who is currently in hospice.  It is unclear the etiology of this episode of loss of consciousness.  He does state that he has been taking his oral doxycycline on a regular basis.  He does not report any fevers chills or night sweats. Initially during our discussion he focuses on the persistent pain in his right knee but then switches towards the end of our discussion reporting that he has been having problems with left knee pain and that his left knee is hurting him more than the right knee at this point. 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  Options have been discussed of continued nonoperative management versus attempted operative resection with antibiotic spacer placement versus amputation.  During his last hospitalization there was discussion about proceeding with amputation versus proceeding with attempted resection and antibiotic spacer however he never followed through with recommendations to follow-up with Dr. Sharol Given.  Today he states that he is not interested in amputation.  We thus had a lengthy discussion regarding options again.  Past Medical History:  Diagnosis Date   Alcohol abuse    Anemia    Arthritis    Ascites    Cirrhosis ([place])    Coffee ground emesis    Dehydration 06/17/2017   Febrile illness    GERD  (gastroesophageal reflux disease)    Heart murmur    History of blood transfusion    Hyperlipidemia    Hypertension    Leg swelling    Myocardial infarction ([place]) 2012   Preop cardiovascular exam 04/14/2013   Sepsis ([place]) 06/17/2017   Septic shock ([place]) 06/18/2017   SIRS (systemic inflammatory response syndrome) ([place]) 07/11/2017   Stroke ([place]) 10/2009   TIA   Thrombocytopenia ([place])     Past Surgical History:  Procedure Laterality Date   COLONOSCOPY WITH PROPOFOL N/A 02/07/2016   Procedure: COLONOSCOPY WITH PROPOFOL;  Surgeon: Milus Banister, MD;  Location: [facility];  Service: Endoscopy;  Laterality: N/A;   ESOPHAGOGASTRODUODENOSCOPY (EGD) WITH PROPOFOL N/A 02/07/2016   Procedure: ESOPHAGOGASTRODUODENOSCOPY (EGD) WITH PROPOFOL;  Surgeon: Milus Banister, MD;  Location: [facility];  Service: Endoscopy;  Laterality: N/A;   ESOPHAGOGASTRODUODENOSCOPY (EGD) WITH PROPOFOL N/A 06/22/2017   Procedure: ESOPHAGOGASTRODUODENOSCOPY (EGD) WITH PROPOFOL;  Surgeon: Jerene Bears, MD;  Location: [facility] END[facility]COPY;  Service: Gastroenterology;  Laterality: N/A;   EXCISIONAL TOTAL KNEE ARTHROPLASTY WITH ANTIBIOTIC SPACERS Right 03/20/2020   Procedure: Resection right total knee arthroplasty and placement of antibiotic spacer;  Surgeon: Paralee Cancel, MD;  Location: [facility];  Service: Orthopedics;  Laterality: Right;  90 mins   HERNIA REPAIR Right    inguinal   INGUINAL HERNIA REPAIR Left 02/08/2021   Procedure: OPEN LEFT INGUINAL HERNIA REPAIR WITH MESH;  Surgeon: Coralie Keens, MD;  Location: [facility];  Service: General;  Laterality: Left;   [place]  KNEE Right 11/13/2020   Procedure: IRRIGATION AND DEBRIDEMENT KNEE;  Surgeon: Paralee Cancel, MD;  Location: [facility];  Service: Orthopedics;  Laterality: Right;   JOINT REPLACEMENT     KNEE ARTHROSCOPY     bilateral/  12/14   REIMPLANTATION OF TOTAL KNEE Right 08/02/2020   Procedure: REIMPLANTATION/REVISION OF TOTAL KNEE WITH REMOVAL  OF ANTIBIOTIC SPACER;  Surgeon: Paralee Cancel, MD;  Location: [facility];  Service: Orthopedics;  Laterality: Right;  1mns   TEE WITHOUT CARDIOVERSION N/A 05/13/2018   Procedure: TRANSESOPHAGEAL ECHOCARDIOGRAM (TEE);  Surgeon: AElouise Munroe MD;  [facility]  Service: Cardiovascular;  Laterality: N/A;   TOTAL KNEE ARTHROPLASTY Right 05/02/2013   Procedure: RIGHT TOTAL KNEE ARTHROPLASTY;  Surgeon: MMauri Pole MD;  [facility];  Service: Orthopedics;  Laterality: Right;    Family History  Problem Relation Age of Onset   Hypertension Father    Diabetes Father    Dementia Mother    Lupus Sister     Social History:  reports that he has been smoking cigarettes. He has a 5.00 pack-year smoking history. He has never used smokeless tobacco. He reports current alcohol use. He reports current drug use. Drug: Marijuana.  Allergies: No Known Allergies  Medications: I have reviewed the patient's current medications. Scheduled:  aspirin EC  81 mg Oral BID WC   Chlorhexidine Gluconate Cloth  6 each Topical Q0600   enoxaparin (LOVENOX) injection  40 mg Subcutaneous QHS   feeding supplement  237 mL Oral TID BM   ferrous sulfate  325 mg Oral BID WC   folic acid  1 mg Oral Daily   [START ON 07/09/2022] multivitamin with minerals  1 tablet Oral Daily   mupirocin ointment  1 Application Nasal BID   nadolol  20 mg Oral Daily   thiamine  100 mg Oral Daily   Or   thiamine  100 mg Intravenous Daily    Results for orders placed or performed during the hospital encounter of 07/07/22 (from the past 24 hour(s))  Ethanol     Status: None   Collection Time: 07/07/22  7:28 PM  Result Value Ref Range   Alcohol, Ethyl (B) <10 <10 mg/dL  TSH     Status: None   Collection Time: 07/07/22  7:28 PM  Result Value Ref Range   TSH 1.039 0.350 - 4.500 uIU/mL  Ammonia     Status: None   Collection Time: 07/07/22  7:28 PM  Result Value Ref Range   Ammonia 15 9 - 35 umol/L  Blood culture  (routine x 2)     Status: None (Preliminary result)   Collection Time: 07/07/22  9:30 PM   Specimen: BLOOD  Result Value Ref Range   Specimen Description      BLOOD BLOOD RIGHT FOREARM [facility] at [facility] [address] [place] [address]   Special Requests      BOTTLES DRAWN AEROBIC AND ANAEROBIC Blood Culture results may not be optimal due to an inadequate volume of blood received in culture bottles Performed at [facility] [address], [place] [address]   Culture      NO GROWTH < 12 HOURS Performed at [address], [place] [id number]   Report Status PENDING   Sedimentation rate     Status: Abnormal   Collection Time: 07/07/22  9:49 PM  Result Value Ref Range   Sed  Rate 130 (H) 0 - 16 mm/hr  C-reactive protein     Status: Abnormal   Collection Time: 07/07/22  9:49 PM  Result Value Ref Range   CRP 3.7 (H) <1.0 mg/dL  CBC     Status: Abnormal   Collection Time: 07/07/22  9:49 PM  Result Value Ref Range   WBC 2.8 (L) 4.0 - 10.5 K/uL   RBC 2.99 (L) 4.22 - 5.81 MIL/uL   Hemoglobin 9.2 (L) 13.0 - 17.0 g/dL   HCT 29.8 (L) 39.0 - 52.0 %   MCV 99.7 80.0 - 100.0 fL   MCH 30.8 26.0 - 34.0 pg   MCHC 30.9 30.0 - 36.0 g/dL   RDW 15.8 (H) 11.5 - 15.5 %   Platelets 80 (L) 150 - 400 K/uL   nRBC 0.0 0.0 - 0.2 %  Creatinine, serum     Status: Abnormal   Collection Time: 07/07/22  9:49 PM  Result Value Ref Range   Creatinine, Ser 1.49 (H) 0.61 - 1.24 mg/dL   GFR, Estimated 51 (L) >60 mL/min  Blood gas, arterial     Status: Abnormal   Collection Time: 07/07/22 10:31 PM  Result Value Ref Range   FIO2 21 %   Mode ROOM AIR    RATE 88 resp/min   pH, Arterial 7.41 7.35 - 7.45   pCO2 arterial 44 32 - 48 mmHg   pO2, Arterial 85 83 - 108 mmHg   Bicarbonate 27.9 20.0 - 28.0 mmol/L   Acid-Base Excess 2.5 (H) 0.0 - 2.0 mmol/L   O2 Saturation 98.3 %   Patient temperature 36.5    Collection  site RIGHT RADIAL    Drawn by [id number]    Allens test (pass/fail) PASS PASS  Blood culture (routine x 2)     Status: None (Preliminary result)   Collection Time: 07/08/22  1:21 AM   Specimen: BLOOD  Result Value Ref Range   Specimen Description      BLOOD BLOOD RIGHT ARM Performed at [facility] [place] [address] [place], [place] [address]    Special Requests      BOTTLES DRAWN AEROBIC ONLY Blood Culture adequate volume Performed at [facility] [address], [place], [place] [address]    Culture      NO GROWTH < 12 HOURS Performed at [place] [address] [place],  [address]    Report Status PENDING   CBC     Status: Abnormal   Collection Time: 07/08/22  1:21 AM  Result Value Ref Range   WBC 3.6 (L) 4.0 - 10.5 K/uL   RBC 2.81 (L) 4.22 - 5.81 MIL/uL   Hemoglobin 8.6 (L) 13.0 - 17.0 g/dL   HCT 27.7 (L) 39.0 - 52.0 %   MCV 98.6 80.0 - 100.0 fL   MCH 30.6 26.0 - 34.0 pg   MCHC 31.0 30.0 - 36.0 g/dL   RDW 15.8 (H) 11.5 - 15.5 %   Platelets 170 150 - 400 K/uL   nRBC 0.0 0.0 - 0.2 %  Comprehensive metabolic panel     Status: Abnormal   Collection Time: 07/08/22  1:21 AM  Result Value Ref Range   Sodium 142 135 - 145 mmol/L   Potassium 4.4 3.5 - 5.1 mmol/L   Chloride 108 98 - 111 mmol/L   CO2 24 22 - 32 mmol/L   Glucose, Bld 69 (L) 70 - 99 mg/dL   BUN 25 (H) 8 - 23 mg/dL  Creatinine, Ser 1.64 (H) 0.61 - 1.24 mg/dL   Calcium 8.7 (L) 8.9 - 10.3 mg/dL   Total Protein 7.8 6.5 - 8.1 g/dL   Albumin 3.1 (L) 3.5 - 5.0 g/dL   AST 23 15 - 41 U/L   ALT 11 0 - 44 U/L   Alkaline Phosphatase 84 38 - 126 U/L   Total Bilirubin 1.0 0.3 - 1.2 mg/dL   GFR, Estimated 45 (L) >60 mL/min   Anion gap 10 5 - 15  Protime-INR     Status: Abnormal   Collection Time: 07/08/22  1:21 AM  Result Value Ref Range   Prothrombin Time 16.8 (H) 11.4 - 15.2 seconds   INR 1.4 (H) 0.8 - 1.2  MRSA Next Gen by PCR, Nasal     Status: None    Collection Time: 07/08/22  6:26 AM   Specimen: Nasal Mucosa; Nasal Swab  Result Value Ref Range   MRSA by PCR Next Gen NOT DETECTED NOT DETECTED     X-ray: CLINICAL DATA:  Right knee pain   EXAM: RIGHT KNEE - COMPLETE 4+ VIEW   COMPARISON:  02/26/2022   FINDINGS: Frontal, bilateral oblique, and lateral views of the right knee are obtained on 4 images. 3 component right knee arthroplasty is again identified, with no significant change in the alignment of the orthopedic hardware. Resorptive changes surrounding the proximal tibia and distal femur along the margins of the orthopedic hardware again noted, concerning for prosthesis infection. There has been moderate improvement in the subcutaneous edema seen previously surrounding the right knee. Persistent moderate right knee effusion.   There are no acute displaced fractures.   IMPRESSION: 1. Persistent periprosthetic lucency of the distal femur and proximal tibia, with associated soft tissue swelling and moderate joint effusion. 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 2. No acute fracture.     Electronically Signed   By: Randa Ngo M.D.  ROS: As per the presenting history and physical as well as past history.  Blood pressure 115/70, pulse 96, temperature 98.3 F (36.8 C), temperature source Oral, resp. rate 11, height '5\' 8"'$  (1.727 m), weight 59.2 kg, SpO2 100 %.  Physical Exam: 67 year old male awake alert and oriented.  He is in no acute distress. Thin elderly adult male, appears chronically malnourished and edentulous Slightly tachycardic, regular, no murmurs, no JVD, no peripheral edema Respiratory rate seems normal, lungs clear without rales or wheezes Abdomen soft without tenderness palpation or guarding, no ascites or distention  Attention normal, affect irritable, oriented to "hospital", "[facility]", "March", and "probably Tuesday".  Moves upper extremities with generalized weakness  with symmetric strength  Right knee exam: His surgical incision remains intact There is no significant erythema or warmth On examination I do not feel a significant knee effusion rather that his knee is overall rather boggy obviously larger than his contralateral native knee. He does have tenderness to palpation about the knee His range of motion is limited with this right knee related to arthrofibrosis and postoperative scarring associated with some pain. Left knee exam: No palpable effusion, warmth erythema Mild tenderness Slight genu varum Slight flexion contracture  Assessment/Plan: 1.  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 2.  Bilateral lower extremity pain  Plan: Today we had a lengthy discussion regarding the condition of his knee.  We were asked to see if there would be any  potential way to aspirate his knee joint however based on his appearance of his knee on exam I think pulling fluid off his knee may proved to be a challenge and less it was performed under ultrasound guidance through interventional radiology.  However at this point I do not feel that this is indicated due to the fact that the treatment options that we have discussed including reresection of his knee with placement of antibiotic spacer and the challenges inherent with this at this point versus that of an amputation were reviewed and he does not wish to proceed in this fashion. I do not feel that returning to the operating room at this point to wash his knee out will provide any substantial benefit as it would be inadequate treatment. During our discussion he expresses a desire to get out of the hospital this evening.  He does state that he has doxycycline that was refilled recently through an infectious disease evaluation in the office.  I have not seen him  back in the office since his last hospitalization but did tell him I would be glad to see him back in the office to continue our discussion regarding treatment options.  We have had these discussions at length multiple times and each time he is elected not to proceed with definitive treatment as recommended. I do not feel that he is septic and thus I do not feel that discharging with IV antibiotics would be necessary. I did tell him that he can contact our office to schedule an appointment if he would like to continue to have a dialogue regarding definitive treatment. Of note with regards to his left knee pain I do not palpate any significant effusion.  I am confident he has degenerative changes present in his left knee but based on his underlying medical comorbidities using anti-inflammatories and/or Tylenol would likely be limited thus ice may be the only method of managing some of the symptoms.  Based on his narcotic use in the past I would not recommend discharging with any pain medication.  Mauri Pole 07/08/2022, 7:19 PM

## 2022-07-08 NOTE — Consult Note (Signed)
[facility]                                                                                       Patient Identification: Patient Name: Mark [name]. MRN: [id number] Admit Date: 07/07/2022  6:00 PM Today's Date: 07/08/2022 Reason for consult: PJI  Requesting provider: Dr Loleta Books   Principal Problem:   Acute metabolic encephalopathy Active Problems:   Cirrhosis, alcoholic ([place])   Polysubstance abuse ([place])   Alcohol abuse   AKI (acute kidney injury) ([place])   Right knee pain   Cerebrovascular disease   Renal cyst   Antibiotics:  Vancomycin 3/11-c  Lines/Hardware: RT Knee TKA  Assessment 67 Y O with complicated infection h/o as below, including Rt knee PJI ( MRSA) on po doxycycline for suppression but poor compliance, Liver cirrhosis, alcohol and heroin use ( snorts) admitted with ams/frequent falls and worsening rt knee pain. Xray rt knee 3/`11 Persistent periprosthetic lucency of the distal femur and proximal tibia, with associated soft tissue swelling and moderate joint effusion. 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  Liver cirrhosis - Hep b surface ag negative 05/13/2018, HCV ab negative 05/13/2018. HIV NR 02/26/2022   Recommendations  Will switch vancomycin to daptomycin given AKI  Fu orthopedic recs  Would get aspiration of the rt knee given effusion at the earliest. Please send aspirate for cell count/differential, glucose protein, aerobic and anaerobic cx  Fu blood cx Monitor CBC, BMP and CPK  D/w Primary team  Following   Rest of the management as per the primary team. Please call with questions or concerns.  Thank you for the consult  Rosiland Oz, MD Infectious Disease Physician [facility] [address] [place],  [address] Phone: [phone]  Fax:  [phone]  __________________________________________________________________________________________________________ HPI and Hospital Course: 52 Y O male with PMH as below including group A strep bacteremia in February 2019, MSSA bacteremia in January 2020 as well as in May 2020, latter was complicated by T spine infection, Chronic RT knee PJI s/p resection arthroplasty and spacer 03/20/2020 ( MRSA) s/p 8 weeks of IV daptomycin, EOT 05/02/2019 followed by PO doxycycline followed by re-implantation in 47/2022, MRSA PJI in 11/08/2020 s/p I and D with poly-exchange 11/13/2020 s/p prolonged IV daptomycin followed by po doxycycline, s/p another prolonged course of IV daptomycin for chronic rt PJI ( 03/04/22 MRSA, no orthopedic intervention done) followed by chronic suppression with doxycyline, liver cirrhosis, alcohol and heroin use who presented to ED 3/11 for lethargy, frequent falls, worsening rt knee pain preventing from ambulation with the help of crutches. No known fevers, nausea, vomiting, abdominal pain and diarrhea.   At ED, afebrile. Labs remarkable for WBC 3.6, AKI w Cr 1.86 Imaging as below  Orthopedics was consulted from ED, pending recs   ROS: poor historian and complains of rt knee pain. Limited ROS  Past Medical History:  Diagnosis Date   Alcohol abuse    Anemia    Arthritis    Ascites    Cirrhosis ([place])    Coffee ground emesis    Dehydration 06/17/2017  Febrile illness    GERD (gastroesophageal reflux disease)    Heart murmur    History of blood transfusion    Hyperlipidemia    Hypertension    Leg swelling    Myocardial infarction ([place]) 2012   Preop cardiovascular exam 04/14/2013   Sepsis ([place]) 06/17/2017   Septic shock ([place]) 06/18/2017   SIRS (systemic inflammatory response syndrome) ([place]) 07/11/2017   Stroke ([place]) 10/2009   TIA   Thrombocytopenia ([place])    Past Surgical History:  Procedure Laterality Date   COLONOSCOPY WITH PROPOFOL N/A 02/07/2016   Procedure:  COLONOSCOPY WITH PROPOFOL;  Surgeon: Milus Banister, MD;  Location: [facility];  Service: Endoscopy;  Laterality: N/A;   ESOPHAGOGASTRODUODENOSCOPY (EGD) WITH PROPOFOL N/A 02/07/2016   Procedure: ESOPHAGOGASTRODUODENOSCOPY (EGD) WITH PROPOFOL;  Surgeon: Milus Banister, MD;  Location: [facility];  Service: Endoscopy;  Laterality: N/A;   ESOPHAGOGASTRODUODENOSCOPY (EGD) WITH PROPOFOL N/A 06/22/2017   Procedure: ESOPHAGOGASTRODUODENOSCOPY (EGD) WITH PROPOFOL;  Surgeon: Jerene Bears, MD;  Location: [facility] ENDOSCOPY;  Service: Gastroenterology;  Laterality: N/A;   EXCISIONAL TOTAL KNEE ARTHROPLASTY WITH ANTIBIOTIC SPACERS Right 03/20/2020   Procedure: Resection right total knee arthroplasty and placement of antibiotic spacer;  Surgeon: Paralee Cancel, MD;  Location: [facility];  Service: Orthopedics;  Laterality: Right;  90 mins   HERNIA REPAIR Right    inguinal   INGUINAL HERNIA REPAIR Left 02/08/2021   Procedure: OPEN LEFT INGUINAL HERNIA REPAIR WITH MESH;  Surgeon: Coralie Keens, MD;  Location: [facility];  Service: General;  Laterality: Left;   IRRIGATION AND DEBRIDEMENT KNEE Right 11/13/2020   Procedure: IRRIGATION AND DEBRIDEMENT KNEE;  Surgeon: Paralee Cancel, MD;  Location: [facility];  Service: Orthopedics;  Laterality: Right;   JOINT REPLACEMENT     KNEE ARTHROSCOPY     bilateral/  12/14   REIMPLANTATION OF TOTAL KNEE Right 08/02/2020   Procedure: REIMPLANTATION/REVISION OF TOTAL KNEE WITH REMOVAL OF ANTIBIOTIC SPACER;  Surgeon: Paralee Cancel, MD;  Location: [facility];  Service: Orthopedics;  Laterality: Right;  68mns   TEE WITHOUT CARDIOVERSION N/A 05/13/2018   Procedure: TRANSESOPHAGEAL ECHOCARDIOGRAM (TEE);  Surgeon: AElouise Munroe MD;  [facility]  Service: Cardiovascular;  Laterality: N/A;   TOTAL KNEE ARTHROPLASTY Right 05/02/2013   Procedure: RIGHT TOTAL KNEE ARTHROPLASTY;  Surgeon: MMauri Pole MD;  Location: [facility];  Service: Orthopedics;  Laterality: Right;     Scheduled Meds:  aspirin EC  81 mg Oral BID WC   Chlorhexidine Gluconate Cloth  6 each Topical Q0600   enoxaparin (LOVENOX) injection  40 mg Subcutaneous QHS   ferrous sulfate  325 mg Oral BID WC   mupirocin ointment  1 Application Nasal BID   Continuous Infusions:  lactated ringers 50 mL/hr at 07/08/22 0023   [START ON 07/09/2022] vancomycin     PRN Meds:.albuterol, ondansetron **OR** ondansetron (ZOFRAN) IV  No Known Allergies  Social History   Socioeconomic History   Marital status: Married    Spouse name: Not on file   Number of children: 4   Years of education: Not on file   Highest education level: Not on file  Occupational History   Occupation: Maintenance    Employer: [employer]  Tobacco Use   Smoking status: Some Days    Packs/day: 0.50    Years: 10.00    Total pack years: 5.00    Types: Cigarettes    Last attempt to quit: 01/05/2016    Years since quitting: 6.5  Smokeless tobacco: Never   Tobacco comments:    4 cigarettes a day  Vaping Use   Vaping Use: Never used  Substance and Sexual Activity   Alcohol use: Yes    Comment: 2-3 beers/day   Drug use: Yes    Types: Marijuana   Sexual activity: Not Currently    Comment: now and then smokies marijuana  Other Topics Concern   Not on file  Social History Narrative   Not on file   Social Determinants of Health   Financial Resource Strain: Low Risk  (05/16/2021)   Overall Financial Resource Strain (CARDIA)    Difficulty of Paying Living Expenses: Not hard at all  Food Insecurity: Unknown (02/28/2022)   Hunger Vital Sign    Worried About Running Out of Food in the Last Year: Patient refused    [place] in the Last Year: Patient refused  Transportation Needs: Unknown (02/28/2022)   [place] - Transportation    Lack of Transportation (Medical): Patient refused    Lack of Transportation (Non-Medical): Patient refused  Physical Activity: Sufficiently Active (05/16/2021)   Exercise Vital  Sign    Days of Exercise per Week: 7 days    Minutes of Exercise per Session: 40 min  Stress: No Stress Concern Present (05/16/2021)   [place] of Stress : Not at all  Social Connections: [place] (05/16/2021)   Social Connection and Isolation Panel [NHANES]    Frequency of Communication with Friends and Family: More than three times a week    Frequency of Social Gatherings with Friends and Family: More than three times a week    Attends Religious Services: More than 4 times per year    Active Member of [employer] or Organizations: Yes    Attends Archivist Meetings: More than 4 times per year    Marital Status: Married  Human resources officer Violence: Unknown (02/28/2022)   Humiliation, Afraid, Rape, and Kick questionnaire    Fear of Current or Ex-Partner: Patient refused    Emotionally Abused: Patient refused    Physically Abused: Patient refused    Sexually Abused: Patient refused   Family History  Problem Relation Age of Onset   Hypertension Father    Diabetes Father    Dementia Mother    Lupus Sister     Vitals BP 130/85 (BP Location: Right Arm)   Pulse 85   Temp 98.1 F (36.7 C) (Oral)   Resp 13   Ht '5\' 8"'$  (1.727 m)   Wt 59.2 kg   SpO2 97%   BMI 19.84 kg/m   Physical Exam Constitutional:  elderly black male lying in the bed and appears drowsy and sleeping    Comments:   Cardiovascular:     Rate and Rhythm: Normal rate and regular rhythm.     Heart sounds: s1s2  Pulmonary:     Effort: Pulmonary effort is normal on room air     Comments: Normal breath sounds   Abdominal:     Palpations: Abdomen is soft.     Tenderness: non distended and non tender  Musculoskeletal:        General: Rt knee with some swelling, warmth and tenderness, limited ROM  Skin:    Comments: No rashes   Neurological:     General: oriented to name, place, month and year, he follows some  commands    Pertinent Microbiology Results for orders placed or performed during  the hospital encounter of 07/07/22  Resp panel by RT-PCR (RSV, Flu A&B, Covid) Anterior Nasal Swab     Status: None   Collection Time: 07/07/22  6:34 PM   Specimen: Anterior Nasal Swab  Result Value Ref Range Status   SARS Coronavirus 2 by RT PCR NEGATIVE NEGATIVE Final    Comment: (NOTE) SARS-CoV-2 target nucleic acids are NOT DETECTED.  The SARS-CoV-2 RNA is generally detectable in upper respiratory specimens during the acute phase of infection. The lowest concentration of SARS-CoV-2 viral copies this assay can detect is 138 copies/mL. A negative result does not preclude SARS-Cov-2 infection and should not be used as the sole basis for treatment or other patient management decisions. A negative result may occur with  improper specimen collection/handling, submission of specimen other than nasopharyngeal swab, presence of viral mutation(s) within the areas targeted by this assay, and inadequate number of viral copies(<138 copies/mL). A negative result must be combined with clinical observations, patient history, and epidemiological information. The expected result is Negative.  Fact Sheet for Patients:  EntrepreneurPulse.com.au  Fact Sheet for Healthcare Providers:  IncredibleEmployment.be  This test is no t yet approved or cleared by the [place] FDA and  has been authorized for detection and/or diagnosis of SARS-CoV-2 by FDA under an Emergency Use Authorization (EUA). This EUA will remain  in effect (meaning this test can be used) for the duration of the COVID-19 declaration under Section 564(b)(1) of the Act, 21 U.S.C.section 360bbb-3(b)(1), unless the authorization is terminated  or revoked sooner.       Influenza A by PCR NEGATIVE NEGATIVE Final   Influenza B by PCR NEGATIVE NEGATIVE Final    Comment: (NOTE) The Xpert Xpress SARS-CoV-2/FLU/RSV  plus assay is intended as an aid in the diagnosis of influenza from Nasopharyngeal swab specimens and should not be used as a sole basis for treatment. Nasal washings and aspirates are unacceptable for Xpert Xpress SARS-CoV-2/FLU/RSV testing.  Fact Sheet for Patients: EntrepreneurPulse.com.au  Fact Sheet for Healthcare Providers: IncredibleEmployment.be  This test is not yet approved or cleared by the [place] FDA and has been authorized for detection and/or diagnosis of SARS-CoV-2 by FDA under an Emergency Use Authorization (EUA). This EUA will remain in effect (meaning this test can be used) for the duration of the COVID-19 declaration under Section 564(b)(1) of the Act, 21 U.S.C. section 360bbb-3(b)(1), unless the authorization is terminated or revoked.     Resp Syncytial Virus by PCR NEGATIVE NEGATIVE Final    Comment: (NOTE) Fact Sheet for Patients: EntrepreneurPulse.com.au  Fact Sheet for Healthcare Providers: IncredibleEmployment.be  This test is not yet approved or cleared by the [place] FDA and has been authorized for detection and/or diagnosis of SARS-CoV-2 by FDA under an Emergency Use Authorization (EUA). This EUA will remain in effect (meaning this test can be used) for the duration of the COVID-19 declaration under Section 564(b)(1) of the Act, 21 U.S.C. section 360bbb-3(b)(1), unless the authorization is terminated or revoked.  Performed at [facility], [place] [address], [name], [place] [address]   MRSA Next Gen by PCR, Nasal     Status: None   Collection Time: 07/08/22  6:26 AM   Specimen: Nasal Mucosa; Nasal Swab  Result Value Ref Range Status   MRSA by PCR Next Gen NOT DETECTED NOT DETECTED Final    Comment: (NOTE) The GeneXpert MRSA Assay (FDA approved for NASAL specimens only), is one component of a comprehensive MRSA colonization  surveillance program. It is not  intended to diagnose MRSA infection nor to guide or monitor treatment for MRSA infections. Test performance is not FDA approved in patients less than 63 years old. Performed at [facility], [place] [address] [place], [place] [address]     Pertinent Lab seen by me: CBC    Component Value Date/Time   WBC 3.6 (L) 07/08/2022 0121   RBC 2.81 (L) 07/08/2022 0121   HGB 8.6 (L) 07/08/2022 0121   HCT 27.7 (L) 07/08/2022 0121   PLT 170 07/08/2022 0121   MCV 98.6 07/08/2022 0121   MCH 30.6 07/08/2022 0121   MCHC 31.0 07/08/2022 0121   RDW 15.8 (H) 07/08/2022 0121   LYMPHSABS 1.2 03/10/2022 1342   MONOABS 0.6 03/10/2022 1342   EOSABS 0.5 03/10/2022 1342   BASOSABS 0.1 03/10/2022 1342      Latest Ref Rng & Units 07/08/2022    1:21 AM 07/07/2022    9:49 PM 07/07/2022    6:34 PM  CMP  Glucose 70 - 99 mg/dL 69   94   BUN 8 - 23 mg/dL 25   25   Creatinine 0.61 - 1.24 mg/dL 1.64  1.49  1.86   Sodium 135 - 145 mmol/L 142   139   Potassium 3.5 - 5.1 mmol/L 4.4   4.2   Chloride 98 - 111 mmol/L 108   107   CO2 22 - 32 mmol/L 24   24   Calcium 8.9 - 10.3 mg/dL 8.7   8.6   Total Protein 6.5 - 8.1 g/dL 7.8   8.5   Total Bilirubin 0.3 - 1.2 mg/dL 1.0   0.6   Alkaline Phos 38 - 126 U/L 84   98   AST 15 - 41 U/L 23   25   ALT 0 - 44 U/L 11   11     Pertinent Imagings/Other Imagings Plain films and CT images have been personally visualized and interpreted; radiology reports have been reviewed. Decision making incorporated into the Impression / Recommendations.  US Abdomen Limited  Result Date: 07/07/2022 CLINICAL DATA:  Assess for ascites EXAM: LIMITED ABDOMEN ULTRASOUND FOR ASCITES TECHNIQUE: Limited ultrasound survey for ascites was performed in all four abdominal quadrants. COMPARISON:  None Available. FINDINGS: No free fluid identified in the 4 abdominal quadrants. IMPRESSION: No ascites. Electronically Signed   By: Ronney Asters M.D.   On:  07/07/2022 22:34   US RENAL  Result Date: 07/07/2022 CLINICAL DATA:  [id number] AKI (acute kidney injury) ([place]) [id number] EXAM: RENAL / URINARY TRACT ULTRASOUND COMPLETE COMPARISON:  CT abdomen pelvis 06/17/2017, ultrasound renal 06/17/2017 FINDINGS: Right Kidney: Renal measurements: 9.4 x 4.3 x 4.1 cm = volume: 87 mL. Echogenicity within normal limits. No mass or hydronephrosis visualized. Left Kidney: Renal measurements: 9.3 x 4.1 x 5.1 cm = volume: 109 mL. Echogenicity within normal limits. A stable simple cystic lesion measuring up to 3.3 cm. Stable in size 1.1 x 1 x 1 cm left superior renal pole hypodensity. Hydronephrosis visualized. Urinary bladder: Appears normal for degree of bladder distention. Other: None. IMPRESSION: 1. Indeterminate stable in size 1.1 x 1 x 1 cm left superior renal pole hypodensity. Query simple cystic lesion with no definite increased through transmission identified. Consider 3 month outpatient follow-up ultrasound for further evaluation. 2. Otherwise unremarkable renal ultrasound. Electronically Signed   By: Iven Finn M.D.   On: 07/07/2022 22:33   DG Knee Complete 4 Views Right  Result Date: 07/07/2022 CLINICAL DATA:  Right knee pain EXAM: RIGHT  KNEE - COMPLETE 4+ VIEW COMPARISON:  02/26/2022 FINDINGS: Frontal, bilateral oblique, and lateral views of the right knee are obtained on 4 images. 3 component right knee arthroplasty is again identified, with no significant change in the alignment of the orthopedic hardware. Resorptive changes surrounding the proximal tibia and distal femur along the margins of the orthopedic hardware again noted, concerning for prosthesis infection. There has been moderate improvement in the subcutaneous edema seen previously surrounding the right knee. Persistent moderate right knee effusion. There are no acute displaced fractures. IMPRESSION: 1. Persistent periprosthetic lucency of the distal femur and proximal tibia, with associated soft tissue  swelling and moderate joint effusion. 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 2. No acute fracture. Electronically Signed   By: Randa Ngo M.D.   On: 07/07/2022 19:49   DG Chest Port 1 View  Result Date: 07/07/2022 CLINICAL DATA:  Weakness, right knee pain EXAM: PORTABLE CHEST 1 VIEW COMPARISON:  02/26/2022 FINDINGS: The heart size and mediastinal contours are within normal limits. Both lungs are clear. The visualized skeletal structures are unremarkable. IMPRESSION: No active disease. Electronically Signed   By: Randa Ngo M.D.   On: 07/07/2022 19:46   CT Head Wo Contrast  Result Date: 07/07/2022 CLINICAL DATA:  Confusion, altered level of consciousness EXAM: CT HEAD WITHOUT CONTRAST TECHNIQUE: Contiguous axial images were obtained from the base of the skull through the vertex without intravenous contrast. RADIATION DOSE REDUCTION: This exam was performed according to the departmental dose-optimization program which includes automated exposure control, adjustment of the mA and/or kV according to patient size and/or use of iterative reconstruction technique. COMPARISON:  02/26/2022 FINDINGS: Brain: No acute infarct or hemorrhage. Lateral ventricles and midline structures are unremarkable. No acute extra-axial fluid collections. No mass effect. Vascular: No hyperdense vessel or unexpected calcification. Skull: Normal. Negative for fracture or focal lesion. Sinuses/Orbits: Mild mucosal thickening within the right maxillary sinus and sphenoid sinus. Other: None. IMPRESSION: 1. No acute intracranial process. Electronically Signed   By: Randa Ngo M.D.   On: 07/07/2022 19:45     I spent 85 minutes for this patient encounter including review of prior medical records/discussing diagnostics and treatment plan with the patient/family/coordinate care with primary/other specialits with greater than 50% of time in face to face encounter.   Electronically signed by:   Rosiland Oz, MD Infectious Disease Physician [facility] for Infectious Disease Pager: [phone]

## 2022-07-08 NOTE — Assessment & Plan Note (Addendum)
At baseline, lives at home, no significant memory issues, independent for self cares.  On admission, was less alert, more confused than baseline per family.  This appears somewhat better with treatment in the hospital.  TSH, ammonia, ABG normal.  I suspect it is a constitutional reflection of his systemic illness (most likely knee infection, although work up is pending), doubt stroke (no focal signs). - Standard delirium precautions: blinds open and lights on during day, TV off, minimize interruptions at night, glasses/hearing aids, PT/OT, avoiding [place] medications

## 2022-07-09 ENCOUNTER — Other Ambulatory Visit (HOSPITAL_COMMUNITY): Payer: Self-pay

## 2022-07-09 DIAGNOSIS — M25561 Pain in right knee: Secondary | ICD-10-CM | POA: Diagnosis not present

## 2022-07-09 LAB — CK: Total CK: 51 U/L (ref 49–397)

## 2022-07-09 MED ORDER — DOXYCYCLINE HYCLATE 100 MG PO TABS
100.0000 mg | ORAL_TABLET | Freq: Two times a day (BID) | ORAL | 11 refills | Status: DC
  Filled 2022-07-09: qty 60, 30d supply, fill #0

## 2022-07-09 MED ORDER — FOLIC ACID 1 MG PO TABS: 1.0000 mg | ORAL_TABLET | Freq: Every day | ORAL | 0 refills | Status: DC

## 2022-07-09 MED ORDER — DOXYCYCLINE HYCLATE 100 MG PO TABS: 100.0000 mg | ORAL_TABLET | Freq: Two times a day (BID) | ORAL | Status: DC

## 2022-07-09 MED ORDER — VITAMIN B-1 100 MG PO TABS: 100.0000 mg | ORAL_TABLET | Freq: Every day | ORAL | 0 refills | Status: DC

## 2022-07-09 NOTE — Discharge Summary (Signed)
[facility]  Physician Discharge Summary   Patient ID: Mark [name]. MRN: [id number] DOB/AGE: [birth year] 67 y.o.  Admit date: 07/07/2022 Discharge date: 07/09/2022    PCP: [name], MD  DISCHARGE DIAGNOSES:    Right knee pain   Acute metabolic encephalopathy, resolved   Cirrhosis, alcoholic ([name])   Polysubstance abuse ([place])   Alcohol abuse   AKI (acute kidney injury) ([place])   Cerebrovascular disease   Renal cyst   Protein-calorie malnutrition, severe   RECOMMENDATIONS FOR OUTPATIENT FOLLOW UP: Patient instructed to follow-up with orthopedic surgery as needed    Home Health: None Equipment/Devices: None  CODE STATUS: DNR  DISCHARGE CONDITION: fair  Diet recommendation: As before  INITIAL HISTORY:  67 y.o. M with alcohol cirrhosis, ongoing use, HTN, HLD, prior CVA, hx right knee infected prosthesis who presented with few days increased weakness, then right knee pain, confusion.   In the ER, there was suspicion for recurrent prosthetic infection, so Orthopedics were consulted.  Consultations: Orthopedics Infectious disease  Procedures: None   HOSPITAL COURSE:   Right knee pain Radiograph shows lucency c/w ongoing infection.  D/w Orthopedics, Dr. Alvan Dame, patient unfortunately has no further surgical options other than amputation. Patient seen by orthopedics.  Extensive discussions between the orthopedic surgeon and patient.  In the end patient elected not to undergo any kind of surgical intervention.  Orthopedics did not feel that there was any new active infection in the joint requiring aspiration.  Discussed with infectious disease.  Plan is to continue with doxycycline in the outpatient setting.  Okay for discharge today.    Acute metabolic encephalopathy At baseline, lives at home, no significant memory issues, independent for self cares.  On admission, was less alert, more confused than baseline per family.  This appears somewhat better  with treatment in the hospital. TSH, ammonia, ABG normal.  Patient is back to baseline.   Cirrhosis, alcoholic (Mark [name])  Renal cyst Incidental finding, nature unclear   Cerebrovascular disease   AKI (acute kidney injury) (Mark [name]) Renal function has improved.     Alcohol abuse   Polysubstance abuse (Mark [name])   Severe protein calorie malnutrition Nutrition Problem: Severe Malnutrition Etiology: chronic illness  Patient is stable.  Wants to go home.  Okay for discharge home today.  PERTINENT LABS:  The results of significant diagnostics from this hospitalization (including imaging, microbiology, ancillary and laboratory) are listed below for reference.    Microbiology: Recent Results (from the past 240 hour(s))  Resp panel by RT-PCR (RSV, Flu A&B, Covid) Anterior Nasal Swab     Status: None   Collection Time: 07/07/22  6:34 PM   Specimen: Anterior Nasal Swab  Result Value Ref Range Status   SARS Coronavirus 2 by RT PCR NEGATIVE NEGATIVE Final    Comment: (NOTE) SARS-CoV-2 target nucleic acids are NOT DETECTED.  The SARS-CoV-2 RNA is generally detectable in upper respiratory specimens during the acute phase of infection. The lowest concentration of SARS-CoV-2 viral copies this assay can detect is 138 copies/mL. A negative result does not preclude SARS-Cov-2 infection and should not be used as the sole basis for treatment or other patient management decisions. A negative result may occur with  improper specimen collection/handling, submission of specimen other than nasopharyngeal swab, presence of viral mutation(s) within the areas targeted by this assay, and inadequate number of viral copies(<138 copies/mL). A negative result must be combined with clinical observations, patient history, and epidemiological information. The expected result is Negative.  Fact Sheet for Patients:  EntrepreneurPulse.com.au  Fact Sheet for Healthcare Providers:   IncredibleEmployment.be  This test is no t yet approved or cleared by the [place] FDA and  has been authorized for detection and/or diagnosis of SARS-CoV-2 by FDA under an Emergency Use Authorization (EUA). This EUA will remain  in effect (meaning this test can be used) for the duration of the COVID-19 declaration under Section 564(b)(1) of the Act, 21 U.S.C.section 360bbb-3(b)(1), unless the authorization is terminated  or revoked sooner.       Influenza A by PCR NEGATIVE NEGATIVE Final   Influenza B by PCR NEGATIVE NEGATIVE Final    Comment: (NOTE) The Xpert Xpress SARS-CoV-2/FLU/RSV plus assay is intended as an aid in the diagnosis of influenza from Nasopharyngeal swab specimens and should not be used as a sole basis for treatment. Nasal washings and aspirates are unacceptable for Xpert Xpress SARS-CoV-2/FLU/RSV testing.  Fact Sheet for Patients: EntrepreneurPulse.com.au  Fact Sheet for Healthcare Providers: IncredibleEmployment.be  This test is not yet approved or cleared by the [place] FDA and has been authorized for detection and/or diagnosis of SARS-CoV-2 by FDA under an Emergency Use Authorization (EUA). This EUA will remain in effect (meaning this test can be used) for the duration of the COVID-19 declaration under Section 564(b)(1) of the Act, 21 U.S.C. section 360bbb-3(b)(1), unless the authorization is terminated or revoked.     Resp Syncytial Virus by PCR NEGATIVE NEGATIVE Final    Comment: (NOTE) Fact Sheet for Patients: EntrepreneurPulse.com.au  Fact Sheet for Healthcare Providers: IncredibleEmployment.be  This test is not yet approved or cleared by the [place] FDA and has been authorized for detection and/or diagnosis of SARS-CoV-2 by FDA under an Emergency Use Authorization (EUA). This EUA will remain in effect (meaning this test can be used) for  the duration of the COVID-19 declaration under Section 564(b)(1) of the Act, 21 U.S.C. section 360bbb-3(b)(1), unless the authorization is terminated or revoked.  Performed at [facility], [place] [address] [place], [place] [address]   Blood culture (routine x 2)     Status: None (Preliminary result)   Collection Time: 07/07/22  9:30 PM   Specimen: BLOOD  Result Value Ref Range Status   Specimen Description   Final    BLOOD BLOOD RIGHT FOREARM Performed at [facility] [address] [place], [place] [address]    Special Requests   Final    BOTTLES DRAWN AEROBIC AND ANAEROBIC Blood Culture results may not be optimal due to an inadequate volume of blood received in culture bottles Performed at [facility] [address] [place], [place] [address]    Culture   Final    NO GROWTH 2 DAYS Performed at [facility] [address], [place], [place] [address]    Report Status PENDING  Incomplete  Blood culture (routine x 2)     Status: None (Preliminary result)   Collection Time: 07/08/22  1:21 AM   Specimen: BLOOD  Result Value Ref Range Status   Specimen Description   Final    BLOOD BLOOD RIGHT ARM Performed at [facility] [address] [place], [place] [address]    Special Requests   Final    BOTTLES DRAWN AEROBIC ONLY Blood Culture adequate volume Performed at [facility] [address] [place],  [address]    Culture   Final    NO GROWTH 1 DAY Performed at [facility], [place]  [address], [place], [place] [address]    Report Status PENDING  Incomplete  MRSA Next Gen by PCR, Nasal     Status: None   Collection Time: 07/08/22  6:26 AM   Specimen: Nasal Mucosa; Nasal Swab  Result Value Ref Range Status   MRSA by PCR Next Gen NOT DETECTED NOT DETECTED Final    Comment: (NOTE) The GeneXpert MRSA Assay (FDA approved for NASAL specimens only), is one  component of a comprehensive MRSA colonization surveillance program. It is not intended to diagnose MRSA infection nor to guide or monitor treatment for MRSA infections. Test performance is not FDA approved in patients less than 31 years old. Performed at [facility], [place] [address], [place], [place] [address]      Labs:   Basic Metabolic Panel: Recent Labs  Lab 07/07/22 1834 07/07/22 2149 07/08/22 0121  NA 139  --  142  K 4.2  --  4.4  CL 107  --  108  CO2 24  --  24  GLUCOSE 94  --  69*  BUN 25*  --  25*  CREATININE 1.86* 1.49* 1.64*  CALCIUM 8.6*  --  8.7*   Liver Function Tests: Recent Labs  Lab 07/07/22 1834 07/08/22 0121  AST 25 23  ALT 11 11  ALKPHOS 98 84  BILITOT 0.6 1.0  PROT 8.5* 7.8  ALBUMIN 3.3* 3.1*    Recent Labs  Lab 07/07/22 1928  AMMONIA 15   CBC: Recent Labs  Lab 07/07/22 1834 07/07/22 2149 07/08/22 0121  WBC 3.6* 2.8* 3.6*  HGB 10.2* 9.2* 8.6*  HCT 32.9* 29.8* 27.7*  MCV 97.3 99.7 98.6  PLT 172 80* 170   Cardiac Enzymes: Recent Labs  Lab 07/09/22 0424  CKTOTAL 51    CBG: Recent Labs  Lab 07/07/22 1809  GLUCAP 96     IMAGING STUDIES US Abdomen Limited  Result Date: 07/07/2022 CLINICAL DATA:  Assess for ascites EXAM: LIMITED ABDOMEN ULTRASOUND FOR ASCITES TECHNIQUE: Limited ultrasound survey for ascites was performed in all four abdominal quadrants. COMPARISON:  None Available. FINDINGS: No free fluid identified in the 4 abdominal quadrants. IMPRESSION: No ascites. Electronically Signed   By: Ronney Asters M.D.   On: 07/07/2022 22:34   US RENAL  Result Date: 07/07/2022 CLINICAL DATA:  [id number] AKI (acute kidney injury) ([place]) [id number] EXAM: RENAL / URINARY TRACT ULTRASOUND COMPLETE COMPARISON:  CT abdomen pelvis 06/17/2017, ultrasound renal 06/17/2017 FINDINGS: Right Kidney: Renal measurements: 9.4 x 4.3 x 4.1 cm = volume: 87 mL. Echogenicity within normal limits. No mass or hydronephrosis visualized. Left  Kidney: Renal measurements: 9.3 x 4.1 x 5.1 cm = volume: 109 mL. Echogenicity within normal limits. A stable simple cystic lesion measuring up to 3.3 cm. Stable in size 1.1 x 1 x 1 cm left superior renal pole hypodensity. Hydronephrosis visualized. Urinary bladder: Appears normal for degree of bladder distention. Other: None. IMPRESSION: 1. Indeterminate stable in size 1.1 x 1 x 1 cm left superior renal pole hypodensity. Query simple cystic lesion with no definite increased through transmission identified. Consider 3 month outpatient follow-up ultrasound for further evaluation. 2. Otherwise unremarkable renal ultrasound. Electronically Signed   By: Iven Finn M.D.   On: 07/07/2022 22:33   DG Knee Complete 4 Views Right  Result Date: 07/07/2022 CLINICAL DATA:  Right knee pain EXAM: RIGHT KNEE - COMPLETE 4+ VIEW COMPARISON:  02/26/2022 FINDINGS: Frontal, bilateral oblique, and lateral views of the right knee are obtained on 4 images. 3 component  right knee arthroplasty is again identified, with no significant change in the alignment of the orthopedic hardware. Resorptive changes surrounding the proximal tibia and distal femur along the margins of the orthopedic hardware again noted, concerning for prosthesis infection. There has been moderate improvement in the subcutaneous edema seen previously surrounding the right knee. Persistent moderate right knee effusion. There are no acute displaced fractures. IMPRESSION: 1. Persistent periprosthetic lucency of the distal femur and proximal tibia, with associated soft tissue swelling and moderate joint effusion. 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 2. No acute fracture. Electronically Signed   By: Randa Ngo M.D.   On: 07/07/2022 19:49   DG Chest Port 1 View  Result Date: 07/07/2022 CLINICAL DATA:  Weakness, right knee pain EXAM: PORTABLE CHEST 1 VIEW COMPARISON:  02/26/2022 FINDINGS: The heart size and mediastinal contours are  within normal limits. Both lungs are clear. The visualized skeletal structures are unremarkable. IMPRESSION: No active disease. Electronically Signed   By: Randa Ngo M.D.   On: 07/07/2022 19:46   CT Head Wo Contrast  Result Date: 07/07/2022 CLINICAL DATA:  Confusion, altered level of consciousness EXAM: CT HEAD WITHOUT CONTRAST TECHNIQUE: Contiguous axial images were obtained from the base of the skull through the vertex without intravenous contrast. RADIATION DOSE REDUCTION: This exam was performed according to the departmental dose-optimization program which includes automated exposure control, adjustment of the mA and/or kV according to patient size and/or use of iterative reconstruction technique. COMPARISON:  02/26/2022 FINDINGS: Brain: No acute infarct or hemorrhage. Lateral ventricles and midline structures are unremarkable. No acute extra-axial fluid collections. No mass effect. Vascular: No hyperdense vessel or unexpected calcification. Skull: Normal. Negative for fracture or focal lesion. Sinuses/Orbits: Mild mucosal thickening within the right maxillary sinus and sphenoid sinus. Other: None. IMPRESSION: 1. No acute intracranial process. Electronically Signed   By: Randa Ngo M.D.   On: 07/07/2022 19:45    DISCHARGE EXAMINATION: Vitals:   07/08/22 1415 07/08/22 1928 07/09/22 0506 07/09/22 1241  BP: 115/70 123/66 128/72 (!) 101/54  Pulse: 96 87 85 77  Resp: '11 15 17 18  '$ Temp: 98.3 F (36.8 C) 98.2 F (36.8 C) 97.9 F (36.6 C) 98.9 F (37.2 C)  TempSrc: Oral Oral Oral Oral  SpO2: 100% 99% 99% 98%  Weight:      Height:       General appearance: Awake alert.  In no distress Resp: Clear to auscultation bilaterally.  Normal effort Cardio: S1-S2 is normal regular.  No S3-S4.  No rubs murmurs or bruit GI: Abdomen is soft.  Nontender nondistended.  Bowel sounds are present normal.  No masses organomegaly   DISPOSITION: Home  Discharge Instructions     AMB Referral to  [place] (ACO Patients)   Complete by: As directed    Please refer to [facility] RN care coordination services for hospital prevention readmission measures due to his extreme high risk.  Raina Mina, RN, BSN [place] Office Hours M-F 8:00 am to 5 pm [phone] mobile [phone] [Office toll free line][facility] Office Hours are M-F 8:30 - 5 pm 24 hour nurse advise line [phone] Conceirge  [email or web address]   Reason for Referral: Care Coordination ([facility] patients)   Disease managment services needed: Nurse Case Manager   Diagnoses of: Other   Other Diagnosis: Readmission prevention with extreme high risk score   Expected date of contact: Emergent - 3 Days   Call  MD for:  difficulty breathing, headache or visual disturbances   Complete by: As directed    Call MD for:  extreme fatigue   Complete by: As directed    Call MD for:  persistant dizziness or light-headedness   Complete by: As directed    Call MD for:  persistant nausea and vomiting   Complete by: As directed    Call MD for:  severe uncontrolled pain   Complete by: As directed    Call MD for:  temperature >100.4   Complete by: As directed    Diet - low sodium heart healthy   Complete by: As directed    Discharge instructions   Complete by: As directed    Please take your medications as prescribed.  Please be sure to follow-up with the orthopedic doctor for further issues with your right knee.  You were cared for by a hospitalist during your hospital stay. If you have any questions about your discharge medications or the care you received while you were in the hospital after you are discharged, you can call the unit and asked to speak with the hospitalist on call if the hospitalist that took care of you is not available. Once you are discharged, your primary care physician will handle any further medical issues.  Please note that NO REFILLS for any discharge medications will be authorized once you are discharged, as it is imperative that you return to your primary care physician (or establish a relationship with a primary care physician if you do not have one) for your aftercare needs so that they can reassess your need for medications and monitor your lab values. If you do not have a primary care physician, you can call [phone] for a physician referral.   Increase activity slowly   Complete by: As directed          Allergies as of 07/09/2022   No Known Allergies      Medication List     STOP taking these medications    docusate sodium 100 MG capsule Commonly known as: Colace   methocarbamol 500 MG tablet Commonly known as: ROBAXIN   pantoprazole 40 MG tablet Commonly known as: PROTONIX   polyethylene glycol 17 g packet Commonly known as: MIRALAX / GLYCOLAX       TAKE these medications    aspirin EC 81 MG tablet Take 81 mg by mouth 2 (two) times daily with a meal. Swallow whole.   doxycycline 100 MG tablet Commonly known as: VIBRA-TABS Take 1 tablet (100 mg total) by mouth 2 (two) times daily.   ferrous sulfate 325 (65 FE) MG tablet Take 1 tablet (325 mg total) by mouth 2 (two) times daily with a meal. Take for two weeks as tolerated. What changed: additional instructions   folic acid 1 MG tablet Commonly known as: FOLVITE Take 1 tablet (1 mg total) by mouth daily. What changed: See the new instructions.   furosemide 20 MG tablet Commonly known as: LASIX TAKE 1 TABLET(20 MG) BY MOUTH TWICE DAILY What changed: See the new instructions.   nadolol 20 MG tablet Commonly known as: CORGARD TAKE 1 TABLET BY MOUTH EVERY DAY   thiamine 100 MG tablet Commonly known as: Vitamin B-1 Take 1 tablet (100 mg total) by mouth daily. Start taking on: July 10, 2022          Follow-up Information     Paralee Cancel, MD Follow up.   Specialty: Orthopedic Surgery Contact  information: [address]  [address] [place] [address] [phone]                 TOTAL DISCHARGE TIME: 35 minutes  Chanika Byland [place]  [facility] Pager on [email or web address]  07/09/2022, 5:07 PM

## 2022-07-09 NOTE — Consult Note (Signed)
   [facility] CM Inpatient Consult   07/09/2022  Mark [name] August 16, 1955 [id number]  Orientation with Natividad Brood, [place] [facility] Liaison for review.   Location: [facility] Liaison spoke w/ pt remotely Mark [name]).   [facility]) Accountable Care Organization [ACO] Patient: Mark [name])    Primary Care Provider: [name], MD at [facility] at [place]  Patient screened for readmission hospitalization with noted extreme high risk score for unplanned readmission risk. 2 IP 6 months for [facility]) Care Management service. Pt receptive to a referral for a [facility] care coordination to follow up for ongoing readmission prevention measures.     Plan: HIPAA verified as [facility] hospital liaison introduced available services. Will make a referral for [facility] care coordination to follow up for readmission prevention and extreme high risk score.   [place] does not replace or interfere with any arrangements made by the Inpatient Transition of Care team.   For questions contact:    Raina Mina, RN, [place] Hours M-F 8:00 am to 5 pm [phone] mobile [phone] [Office toll free line][facility] Office Hours are M-F 8:30 - 5 pm 24 hour nurse advise line [phone] Conceirge  [name]

## 2022-07-09 NOTE — Progress Notes (Signed)
PT Cancellation Note  Patient Details Name: Mark [name]. MRN: [id number] DOB: 18-Oct-1955   Cancelled Treatment:    Reason Eval/Treat Not Completed: PT screened, no needs identified, will sign off. RN reports pt is set to d/c. Will sign off.    [facility]  Office: [phone]

## 2022-07-09 NOTE — Progress Notes (Signed)
[facility] Infectious Diseases Follow Up Note  Patient Identification: Patient Name: Mark [name]. MRN: [id number] Admit Date: 07/07/2022  6:00 PM Age: 67 y.o.Today's Date: 07/09/2022  Reason for Visit: chronic rt PJI   Principal Problem:   Right knee pain Active Problems:   Cirrhosis, alcoholic ([name])   Polysubstance abuse ([place])   Acute metabolic encephalopathy   Alcohol abuse   AKI (acute kidney injury) ([place])   Cerebrovascular disease   Renal cyst   Protein-calorie malnutrition, severe  Antibiotics:  Vancomycin 3/12-c   Lines/Hardware: RT Knee TKA  Interval Events: continues to be afebrile,    Assessment 20 Y O with complicated infection h/o as below, including Rt knee PJI ( MRSA) on po doxycycline for suppression but poor compliance, Liver cirrhosis, alcohol and heroin use ( snorts) admitted with ams/frequent falls and worsening rt knee pain. Xray rt knee 3/`11 Persistent periprosthetic lucency of the distal femur and proximal tibia, with associated soft tissue swelling and moderate joint effusion. 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   Liver cirrhosis - Hep b surface ag negative 05/13/2018, HCV ab negative 05/13/2018. HIV NR 02/26/2022   Seen by Ortho Dr Alvan Dame 3/13, no orthopedic intervention recommended currently, acute septic arthritis also thought unlikely with no indication for aspiration of rt knee joint. 3/11 blood cx NG in 2 days. No signs of sepsis. CRP downtrending  Recommendations Given no plans for any orthopedic intervention, no indication of prolonged IV course. Agree that rt knee bogginess appears may be chronic and does not appear to have signs of acute septic rt knee currently.   OK to switch to PO doxycycline which he will need to take indefinitely for now.  He will also need to fu with Orthopedics for definite surgical plans  He has a fu with Dr Megan Salon at [facility] on  07/16/22 ID will SO for now, please call with questions   Rest of the management as per the primary team. Thank you for the consult. Please page with pertinent questions or concerns.  ______________________________________________________________________ Subjective patient seen and examined at the bedside. Denies any significant pain in the rt knee, He is eager to go home. He denies missing any pills for doxycycline  Vitals BP 128/72 (BP Location: Right Arm)   Pulse 85   Temp 97.9 F (36.6 C) (Oral)   Resp 17   Ht '5\' 8"'$  (1.727 m)   Wt 59.2 kg   SpO2 99%   BMI 19.84 kg/m     Physical Exam Constitutional:  elderly black male lying in the bed and appears comfortable     Comments:   Cardiovascular:     Rate and Rhythm: Normal rate and regular rhythm.     Heart sounds:   Pulmonary:     Effort: Pulmonary effort is normal on room air     Comments: Normal breath sounds   Abdominal:     Palpations: Abdomen is soft.     Tenderness: non distended and non tender   Musculoskeletal:        General: Rt knee with some bogginess, no significant erythema, tenderness/warmth. Restricted ROM ( appear to be chronic in nature). No signs of acute septic rt knee as well as left knee   Skin:    Comments: No rashes   Neurological:     General: awake, alert and oriented, follows commands appropriately   Pertinent Microbiology Results for orders placed or performed during the hospital encounter of 07/07/22  Resp panel by RT-PCR (RSV,  Flu A&B, Covid) Anterior Nasal Swab     Status: None   Collection Time: 07/07/22  6:34 PM   Specimen: Anterior Nasal Swab  Result Value Ref Range Status   SARS Coronavirus 2 by RT PCR NEGATIVE NEGATIVE Final    Comment: (NOTE) SARS-CoV-2 target nucleic acids are NOT DETECTED.  The SARS-CoV-2 RNA is generally detectable in upper respiratory specimens during the acute phase of infection. The lowest concentration of SARS-CoV-2 viral copies this assay can  detect is 138 copies/mL. A negative result does not preclude SARS-Cov-2 infection and should not be used as the sole basis for treatment or other patient management decisions. A negative result may occur with  improper specimen collection/handling, submission of specimen other than nasopharyngeal swab, presence of viral mutation(s) within the areas targeted by this assay, and inadequate number of viral copies(<138 copies/mL). A negative result must be combined with clinical observations, patient history, and epidemiological information. The expected result is Negative.  Fact Sheet for Patients:  EntrepreneurPulse.com.au  Fact Sheet for Healthcare Providers:  IncredibleEmployment.be  This test is no t yet approved or cleared by the [place] FDA and  has been authorized for detection and/or diagnosis of SARS-CoV-2 by FDA under an Emergency Use Authorization (EUA). This EUA will remain  in effect (meaning this test can be used) for the duration of the COVID-19 declaration under Section 564(b)(1) of the Act, 21 U.S.C.section 360bbb-3(b)(1), unless the authorization is terminated  or revoked sooner.       Influenza A by PCR NEGATIVE NEGATIVE Final   Influenza B by PCR NEGATIVE NEGATIVE Final    Comment: (NOTE) The Xpert Xpress SARS-CoV-2/FLU/RSV plus assay is intended as an aid in the diagnosis of influenza from Nasopharyngeal swab specimens and should not be used as a sole basis for treatment. Nasal washings and aspirates are unacceptable for Xpert Xpress SARS-CoV-2/FLU/RSV testing.  Fact Sheet for Patients: EntrepreneurPulse.com.au  Fact Sheet for Healthcare Providers: IncredibleEmployment.be  This test is not yet approved or cleared by the [place] FDA and has been authorized for detection and/or diagnosis of SARS-CoV-2 by FDA under an Emergency Use Authorization (EUA). This EUA will remain in  effect (meaning this test can be used) for the duration of the COVID-19 declaration under Section 564(b)(1) of the Act, 21 U.S.C. section 360bbb-3(b)(1), unless the authorization is terminated or revoked.     Resp Syncytial Virus by PCR NEGATIVE NEGATIVE Final    Comment: (NOTE) Fact Sheet for Patients: EntrepreneurPulse.com.au  Fact Sheet for Healthcare Providers: IncredibleEmployment.be  This test is not yet approved or cleared by the [place] FDA and has been authorized for detection and/or diagnosis of SARS-CoV-2 by FDA under an Emergency Use Authorization (EUA). This EUA will remain in effect (meaning this test can be used) for the duration of the COVID-19 declaration under Section 564(b)(1) of the Act, 21 U.S.C. section 360bbb-3(b)(1), unless the authorization is terminated or revoked.  Performed at [facility], [place] [address] [place], [place] [address]   Blood culture (routine x 2)     Status: None (Preliminary result)   Collection Time: 07/07/22  9:30 PM   Specimen: BLOOD  Result Value Ref Range Status   Specimen Description   Final    BLOOD BLOOD RIGHT FOREARM Performed at [facility] [facility] [address], [place], [place] [address]    Special Requests   Final    BOTTLES DRAWN AEROBIC AND ANAEROBIC Blood Culture results may not be optimal due to an  inadequate volume of blood received in culture bottles Performed at [facility] [address] [place], [place] [address]    Culture   Final    NO GROWTH 2 DAYS Performed at [facility] [address] [place], [place] [address]    Report Status PENDING  Incomplete  Blood culture (routine x 2)     Status: None (Preliminary result)   Collection Time: 07/08/22  1:21 AM   Specimen: BLOOD  Result Value Ref Range Status   Specimen Description   Final    BLOOD BLOOD RIGHT ARM Performed at [facility] [facility] [address] [place], [place] [address]    Special Requests   Final    BOTTLES DRAWN AEROBIC ONLY Blood Culture adequate volume Performed at [facility] [address] [place], [place] [address]    Culture   Final    NO GROWTH 1 DAY Performed at [facility], [place] [address] [place], [place] [address]    Report Status PENDING  Incomplete  MRSA Next Gen by PCR, Nasal     Status: None   Collection Time: 07/08/22  6:26 AM   Specimen: Nasal Mucosa; Nasal Swab  Result Value Ref Range Status   MRSA by PCR Next Gen NOT DETECTED NOT DETECTED Final    Comment: (NOTE) The GeneXpert MRSA Assay (FDA approved for NASAL specimens only), is one component of a comprehensive MRSA colonization surveillance program. It is not intended to diagnose MRSA infection nor to guide or monitor treatment for MRSA infections. Test performance is not FDA approved in patients less than 62 years old. Performed at [facility], [place] [address], [place], [place] [address]     Pertinent Lab.    Latest Ref Rng & Units 07/08/2022    1:21 AM 07/07/2022    9:49 PM 07/07/2022    6:34 PM  CBC  WBC 4.0 - 10.5 K/uL 3.6  2.8  3.6   Hemoglobin 13.0 - 17.0 g/dL 8.6  9.2  10.2   Hematocrit 39.0 - 52.0 % 27.7  29.8  32.9   Platelets 150 - 400 K/uL 170  80  172       Latest Ref Rng & Units 07/08/2022    1:21 AM 07/07/2022    9:49 PM 07/07/2022    6:34 PM  CMP  Glucose 70 - 99 mg/dL 69   94   BUN 8 - 23 mg/dL 25   25   Creatinine 0.61 - 1.24 mg/dL 1.64  1.49  1.86   Sodium 135 - 145 mmol/L 142   139   Potassium 3.5 - 5.1 mmol/L 4.4   4.2   Chloride 98 - 111 mmol/L 108   107   CO2 22 - 32 mmol/L 24   24   Calcium 8.9 - 10.3 mg/dL 8.7   8.6   Total Protein 6.5 - 8.1 g/dL 7.8   8.5   Total Bilirubin 0.3 - 1.2 mg/dL 1.0   0.6   Alkaline Phos 38 - 126 U/L 84   98   AST 15 - 41 U/L 23   25   ALT 0 - 44 U/L 11   11      Pertinent Imaging today Plain films and  CT images have been personally visualized and interpreted; radiology reports have been reviewed. Decision making incorporated into the Impression / Recommendations.  US Abdomen Limited  Result Date: 07/07/2022 CLINICAL DATA:  Assess  for ascites EXAM: LIMITED ABDOMEN ULTRASOUND FOR ASCITES TECHNIQUE: Limited ultrasound survey for ascites was performed in all four abdominal quadrants. COMPARISON:  None Available. FINDINGS: No free fluid identified in the 4 abdominal quadrants. IMPRESSION: No ascites. Electronically Signed   By: Ronney Asters M.D.   On: 07/07/2022 22:34   US RENAL  Result Date: 07/07/2022 CLINICAL DATA:  [id number] AKI (acute kidney injury) ([place]) [id number] EXAM: RENAL / URINARY TRACT ULTRASOUND COMPLETE COMPARISON:  CT abdomen pelvis 06/17/2017, ultrasound renal 06/17/2017 FINDINGS: Right Kidney: Renal measurements: 9.4 x 4.3 x 4.1 cm = volume: 87 mL. Echogenicity within normal limits. No mass or hydronephrosis visualized. Left Kidney: Renal measurements: 9.3 x 4.1 x 5.1 cm = volume: 109 mL. Echogenicity within normal limits. A stable simple cystic lesion measuring up to 3.3 cm. Stable in size 1.1 x 1 x 1 cm left superior renal pole hypodensity. Hydronephrosis visualized. Urinary bladder: Appears normal for degree of bladder distention. Other: None. IMPRESSION: 1. Indeterminate stable in size 1.1 x 1 x 1 cm left superior renal pole hypodensity. Query simple cystic lesion with no definite increased through transmission identified. Consider 3 month outpatient follow-up ultrasound for further evaluation. 2. Otherwise unremarkable renal ultrasound. Electronically Signed   By: Iven Finn M.D.   On: 07/07/2022 22:33   DG Knee Complete 4 Views Right  Result Date: 07/07/2022 CLINICAL DATA:  Right knee pain EXAM: RIGHT KNEE - COMPLETE 4+ VIEW COMPARISON:  02/26/2022 FINDINGS: Frontal, bilateral oblique, and lateral views of the right knee are obtained on 4 images. 3 component right knee arthroplasty  is again identified, with no significant change in the alignment of the orthopedic hardware. Resorptive changes surrounding the proximal tibia and distal femur along the margins of the orthopedic hardware again noted, concerning for prosthesis infection. There has been moderate improvement in the subcutaneous edema seen previously surrounding the right knee. Persistent moderate right knee effusion. There are no acute displaced fractures. IMPRESSION: 1. Persistent periprosthetic lucency of the distal femur and proximal tibia, with associated soft tissue swelling and moderate joint effusion. 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 2. No acute fracture. Electronically Signed   By: Randa Ngo M.D.   On: 07/07/2022 19:49   DG Chest Port 1 View  Result Date: 07/07/2022 CLINICAL DATA:  Weakness, right knee pain EXAM: PORTABLE CHEST 1 VIEW COMPARISON:  02/26/2022 FINDINGS: The heart size and mediastinal contours are within normal limits. Both lungs are clear. The visualized skeletal structures are unremarkable. IMPRESSION: No active disease. Electronically Signed   By: Randa Ngo M.D.   On: 07/07/2022 19:46   CT Head Wo Contrast  Result Date: 07/07/2022 CLINICAL DATA:  Confusion, altered level of consciousness EXAM: CT HEAD WITHOUT CONTRAST TECHNIQUE: Contiguous axial images were obtained from the base of the skull through the vertex without intravenous contrast. RADIATION DOSE REDUCTION: This exam was performed according to the departmental dose-optimization program which includes automated exposure control, adjustment of the mA and/or kV according to patient size and/or use of iterative reconstruction technique. COMPARISON:  02/26/2022 FINDINGS: Brain: No acute infarct or hemorrhage. Lateral ventricles and midline structures are unremarkable. No acute extra-axial fluid collections. No mass effect. Vascular: No hyperdense vessel or unexpected calcification. Skull: Normal. Negative  for fracture or focal lesion. Sinuses/Orbits: Mild mucosal thickening within the right maxillary sinus and sphenoid sinus. Other: None. IMPRESSION: 1. No acute intracranial process. Electronically Signed   By: Randa Ngo M.D.   On: 07/07/2022 19:45     I spent  55  minutes for this patient encounter including review of prior medical records, coordination of care with primary/other specialist with greater than 50% of time being face to face/counseling and discussing diagnostics/treatment plan with the patient/family.  Electronically signed by:   Rosiland Oz, MD Infectious Disease Physician [facility] Infectious Disease Pager: [phone]

## 2022-07-10 ENCOUNTER — Telehealth: Payer: Self-pay

## 2022-07-10 ENCOUNTER — Telehealth: Payer: Self-pay | Admitting: *Deleted

## 2022-07-10 NOTE — Transitions of Care (Post Inpatient/ED Visit) (Signed)
   07/10/2022  Name: Mark [name]. MRN: [id number] DOB: [birth year]  Today's TOC FU Call Status: Today's TOC FU Call Status:: Successful TOC FU Call Competed TOC FU Call Complete Date: 07/10/22  Transition Care Management Follow-up Telephone Call Date of Discharge: 07/09/22 Discharge Facility: [facility]) Type of Discharge: Inpatient Admission Primary Inpatient Discharge Diagnosis:: "metabolic encephalopathy" How have you been since you were released from the hospital?: Same Any questions or concerns?: Yes Patient Questions/Concerns:: patient concerned that he may have to have surgery and lose his leg Patient Questions/Concerns Addressed: Other: (advised patient to discuss treatment plan and options with provider)  Items Reviewed: Did you receive and understand the discharge instructions provided?: Yes Medications obtained and verified?: Yes (Medications Reviewed) (pt has not picked up new med-will send son to go get med, patient voices that he was not aware he should be taking lasix but will start taking as ordered) Any new allergies since your discharge?: No Dietary orders reviewed?: Yes Type of Diet Ordered:: low salt/heart healthy Do you have support at home?: Yes People in Home: spouse Name of Support/Comfort Primary Source: [facility] and Equipment/Supplies: [place] Ordered?: NA Any new equipment or medical supplies ordered?: NA  Functional Questionnaire: Do you need assistance with bathing/showering or dressing?: No Do you need assistance with meal preparation?: No Do you need assistance with eating?: No Do you have difficulty maintaining continence: No Do you need assistance with getting out of bed/getting out of a chair/moving?: No Do you have difficulty managing or taking your medications?: No  Folllow up appointments reviewed: PCP Follow-up appointment confirmed?: Yes Date of PCP follow-up appointment?: 07/11/22 Follow-up  Provider: Dr. Volanda Napoleon Specialist [facility] Follow-up appointment confirmed?: No Reason Specialist Follow-Up Not Confirmed: Patient has Specialist Provider Number and will Call for Appointment (states he talked with ortho MD office yesterday and will follow up on appt) Do you need transportation to your follow-up appointment?: No Do you understand care options if your condition(s) worsen?: Yes-patient verbalized understanding  SDOH Interventions Today    Flowsheet Row Most Recent Value  SDOH Interventions   Food Insecurity Interventions Intervention Not Indicated      Interventions Today    Flowsheet Row Most Recent Value  General Interventions   General Interventions Discussed/Reviewed Communication with  Communication with RN  Education Interventions   Education Provided Provided Education  Provided Verbal Education On Nutrition, When to see the doctor, Other, Medication  Nutrition Interventions   Nutrition Discussed/Reviewed Nutrition Discussed  Pharmacy Interventions   Pharmacy Dicussed/Reviewed Pharmacy Topics Discussed, Medications and their functions  Safety Interventions   Safety Discussed/Reviewed Safety Discussed, Fall Risk      TOC Interventions Today    Flowsheet Row Most Recent Value  TOC Interventions   TOC Interventions Discussed/Reviewed TOC Interventions Discussed, Arranged PCP follow up within 7 days/Care Guide scheduled       Hetty Blend [facility] Care Management Care Management Community Coordinator Direct Phone: [phone] Toll Free: [phone] Fax: [phone]

## 2022-07-10 NOTE — Progress Notes (Signed)
  Care Coordination   Note   07/10/2022 Name: Kadir Azucena. MRN: [id number] DOB: [birth year]  Ezequiel Kayser. is a 67 y.o. year old male who sees [name], MD for primary care. I reached out to Ezequiel Kayser. by phone today to offer care coordination services.  Mr. Satter was given information about Care Coordination services today including:   The Care Coordination services include support from the care team which includes your Nurse Coordinator, Clinical Social Worker, or Pharmacist.  The Care Coordination team is here to help remove barriers to the health concerns and goals most important to you. Care Coordination services are voluntary, and the patient may decline or stop services at any time by request to their care team member.   Care Coordination Consent Status: Patient agreed to services and verbal consent obtained.   Follow up plan:  Telephone appointment with care coordination team member scheduled for:  07/10/22  Encounter Outcome:  Pt. Scheduled  [place]  Direct Dial: [phone]

## 2022-07-11 ENCOUNTER — Ambulatory Visit (INDEPENDENT_AMBULATORY_CARE_PROVIDER_SITE_OTHER): Payer: Medicare HMO | Admitting: Family Medicine

## 2022-07-11 ENCOUNTER — Encounter: Payer: Self-pay | Admitting: Family Medicine

## 2022-07-11 VITALS — BP 118/66 | HR 80 | Temp 97.9°F | Ht 68.0 in | Wt 128.0 lb

## 2022-07-11 DIAGNOSIS — M25561 Pain in right knee: Secondary | ICD-10-CM

## 2022-07-11 DIAGNOSIS — T8453XD Infection and inflammatory reaction due to internal right knee prosthesis, subsequent encounter: Secondary | ICD-10-CM | POA: Diagnosis not present

## 2022-07-11 DIAGNOSIS — N179 Acute kidney failure, unspecified: Secondary | ICD-10-CM | POA: Diagnosis not present

## 2022-07-11 DIAGNOSIS — T8453XA Infection and inflammatory reaction due to internal right knee prosthesis, initial encounter: Secondary | ICD-10-CM | POA: Diagnosis not present

## 2022-07-11 DIAGNOSIS — K7030 Alcoholic cirrhosis of liver without ascites: Secondary | ICD-10-CM

## 2022-07-11 DIAGNOSIS — R55 Syncope and collapse: Secondary | ICD-10-CM

## 2022-07-11 DIAGNOSIS — Z09 Encounter for follow-up examination after completed treatment for conditions other than malignant neoplasm: Secondary | ICD-10-CM

## 2022-07-11 NOTE — Progress Notes (Signed)
Established Patient Office Visit   Subjective  Patient ID: Mark Weant., male    DOB: 12-22-1955  Age: 67 y.o. MRN: [id number]  Chief Complaint  Patient presents with   Hospitalization Follow-up    Pt is a 67 yo male with pmh sig for alcoholic cirrhosis, GERD, HTN, HLD, h/o CVA seen for HFU.  Pt admitted 3/11-3/13 for acute metabolic encephalopathy, AKI.  Imaging of right knee with lucency consistent with ongoing infection.  Per Ortho no further surgical options other than amputation.  Patient declined surgical intervention.  Continuation of doxycycline as outpatient advised.  Incidental finding of renal cyst noted on imaging.  Patient doing okay since discharge.  Inquires if any other options remain for right knee other than amputation.  States has appointment with Ortho coming up.  Taking doxycycline.  Denies substance abuse.  States last smoked 3 months ago.    Patient Active Problem List   Diagnosis Date Noted   Right knee pain 07/08/2022   Cerebrovascular disease 07/08/2022   Renal cyst 07/08/2022   Protein-calorie malnutrition, severe 07/08/2022   Toxic metabolic encephalopathy 02/26/2022   Septic joint of right knee joint 11/08/2020   S/P right TKA reimplantation 08/02/2020   Infection of total left knee replacement 03/20/2020   Cirrhosis of liver without ascites    AKI (acute kidney injury)    Bacteremia    Spinal stenosis of lumbar region without neurogenic claudication    Aspiration pneumonia 09/24/2018   Abscess in epidural space of lumbar spine    MRSA bacteremia 09/21/2018   Infection of prosthetic right knee joint 09/20/2018   Anemia of chronic disease 09/20/2018   Hypoalbuminemia 09/20/2018   Hypoglycemia without diagnosis of diabetes mellitus 09/20/2018   Epidural abscess 09/20/2018   Hyperkalemia 05/18/2018   Edentulous 05/11/2018   Pancytopenia 05/10/2018   CAD (coronary artery disease) 05/10/2018   Hepatic encephalopathy 05/10/2018   Alcohol  abuse 05/10/2018   GERD (gastroesophageal reflux disease) 05/10/2018   Duodenal ulcer    Renal failure    Acute on chronic anemia    Hypotension 06/17/2017   Hyponatremia 06/17/2017   Leg edema, right 06/17/2017   Acute metabolic encephalopathy 06/17/2017   Anemia, iron deficiency    Benign neoplasm of ascending colon    Hemorrhoids    Portal hypertensive gastropathy    Gastritis and gastroduodenitis    Esophageal varices without bleeding    H/O: CVA (cerebrovascular accident) 12/01/2013   ACS (acute coronary syndrome) 12/01/2013   Anasarca 12/01/2013   Polysubstance abuse 12/01/2013   CKD (chronic kidney disease) stage 3, GFR 30-59 ml/min 12/01/2013   S/P right TKA 05/02/2013   Murmur 04/14/2013   Right inguinal hernia 12/26/2010   Cirrhosis, alcoholic 12/20/2010   Past Surgical History:  Procedure Laterality Date   COLONOSCOPY WITH PROPOFOL N/A 02/07/2016   Procedure: COLONOSCOPY WITH PROPOFOL;  Surgeon: Rachael Fee, MD;  Location: [facility];  Service: Endoscopy;  Laterality: N/A;   ESOPHAGOGASTRODUODENOSCOPY (EGD) WITH PROPOFOL N/A 02/07/2016   Procedure: ESOPHAGOGASTRODUODENOSCOPY (EGD) WITH PROPOFOL;  Surgeon: Rachael Fee, MD;  Location: [facility];  Service: Endoscopy;  Laterality: N/A;   ESOPHAGOGASTRODUODENOSCOPY (EGD) WITH PROPOFOL N/A 06/22/2017   Procedure: ESOPHAGOGASTRODUODENOSCOPY (EGD) WITH PROPOFOL;  Surgeon: Beverley Fiedler, MD;  Location: [facility] ENDOSCOPY;  Service: Gastroenterology;  Laterality: N/A;   EXCISIONAL TOTAL KNEE ARTHROPLASTY WITH ANTIBIOTIC SPACERS Right 03/20/2020   Procedure: Resection right total knee arthroplasty and placement of antibiotic spacer;  Surgeon: Durene Romans, MD;  Location:  [facility];  Service: Orthopedics;  Laterality: Right;  90 mins   HERNIA REPAIR Right    inguinal   INGUINAL HERNIA REPAIR Left 02/08/2021   Procedure: OPEN LEFT INGUINAL HERNIA REPAIR WITH MESH;  Surgeon: Abigail Miyamoto, MD;  Location: [facility] OR;  Service:  General;  Laterality: Left;   IRRIGATION AND DEBRIDEMENT KNEE Right 11/13/2020   Procedure: IRRIGATION AND DEBRIDEMENT KNEE;  Surgeon: Durene Romans, MD;  Location: [facility];  Service: Orthopedics;  Laterality: Right;   JOINT REPLACEMENT     KNEE ARTHROSCOPY     bilateral/  12/14   REIMPLANTATION OF TOTAL KNEE Right 08/02/2020   Procedure: REIMPLANTATION/REVISION OF TOTAL KNEE WITH REMOVAL OF ANTIBIOTIC SPACER;  Surgeon: Durene Romans, MD;  Location: [facility];  Service: Orthopedics;  Laterality: Right;    TEE WITHOUT CARDIOVERSION N/A 05/13/2018   Procedure: TRANSESOPHAGEAL ECHOCARDIOGRAM (TEE);  Surgeon: Parke Poisson, MD;  Location: [facility] ENDOSCOPY;  Service: Cardiovascular;  Laterality: N/A;   TOTAL KNEE ARTHROPLASTY Right 05/02/2013   Procedure: RIGHT TOTAL KNEE ARTHROPLASTY;  Surgeon: Shelda Pal, MD;  Location: [facility];  Service: Orthopedics;  Laterality: Right;   Social History   Tobacco Use   Smoking status: Former    Packs/day: 0.50    Years: 10.00    Additional pack years: 0.00    Total pack years: 5.00    Types: Cigarettes    Quit date: 01/05/2016    Years since quitting: 6.6   Smokeless tobacco: Never   Tobacco comments:    Pt reports he quit smoke 3 months ago. 07/11/22-km,cma  Vaping Use   Vaping Use: Never used  Substance Use Topics   Alcohol use: Yes    Comment: 2-3 beers/day   Drug use: Yes    Types: Marijuana   Family History  Problem Relation Age of Onset   Hypertension Father    Diabetes Father    Dementia Mother    Lupus Sister    No Known Allergies    ROS Negative unless stated above    Objective:     BP 118/66 (BP Location: Left Arm, Patient Position: Sitting, Cuff Size: Normal)   Pulse 80   Temp 97.9 F (36.6 C) (Oral)   Ht 5\' 8"  (1.727 m)   Wt 128 lb (58.1 kg)   SpO2 100%   BMI 19.46 kg/m    Physical Exam Constitutional:      General: He is not in acute distress.    Appearance: Normal appearance.     Comments: White  powdery substance in nares and on front of shirt  HENT:     Head: Normocephalic and atraumatic.     Nose: Nose normal.     Mouth/Throat:     Mouth: Mucous membranes are moist.  Cardiovascular:     Rate and Rhythm: Normal rate and regular rhythm.     Heart sounds: Normal heart sounds. No murmur heard.    No gallop.  Pulmonary:     Effort: Pulmonary effort is normal. No respiratory distress.     Breath sounds: Normal breath sounds. No wheezing, rhonchi or rales.  Skin:    General: Skin is warm and dry.  Neurological:     Mental Status: He is alert and oriented to person, place, and time.      Assessment & Plan:  Hospital discharge follow up -TCM phone call made and reviewed -hospital notes, imaging, and labs reviewed.  Infection associated with internal right knee prosthesis, subsequent encounter -Recurrent right  knee prosthetic joint infection in the past requiring PICC line for IV antibiotics. -Patient encouraged to continue doxycycline -Patient encouraged to have a discussion with surgeon and infectious disease regarding alternative options and/or second opinion regarding R AKA at upcoming appointment.  Continue supportive care for knee pain. -     CBC with Differential/Platelet; Future  Acute pain of right knee -Use narcotics sparingly -     CBC with Differential/Platelet; Future  Syncope, unspecified syncope type -2/2 acute metabolic encephalopathy now resolved -Recheck labs including UDS -     CBC with Differential/Platelet; Future -     ToxASSURE Select 13 (MW), Urine; Future  Hypocalcemia -Calcium 8.7 on 07/08/2022 -     Parathyroid hormone, intact (no Ca) -     Comprehensive metabolic panel; Future  Alcoholic cirrhosis without ascites -Stable -Discussed the importance of cessation.       -      Comprehensive metabolic panel; Future  Acute kidney injury -Resolved -Creatinine 1.64 and GFR 45 on 07/08/2022       -     Comprehensive metabolic panel;  Future   Return if symptoms worsen or fail to improve.   Deeann Saint, MD

## 2022-07-12 LAB — COMPREHENSIVE METABOLIC PANEL
AG Ratio: 0.7 (calc) — ABNORMAL LOW (ref 1.0–2.5)
ALT: 9 U/L (ref 9–46)
AST: 28 U/L (ref 10–35)
Albumin: 3.4 g/dL — ABNORMAL LOW (ref 3.6–5.1)
Alkaline phosphatase (APISO): 106 U/L (ref 35–144)
BUN/Creatinine Ratio: 20 (calc) (ref 6–22)
BUN: 28 mg/dL — ABNORMAL HIGH (ref 7–25)
CO2: 16 mmol/L — ABNORMAL LOW (ref 20–32)
Calcium: 9 mg/dL (ref 8.6–10.3)
Chloride: 108 mmol/L (ref 98–110)
Creat: 1.41 mg/dL — ABNORMAL HIGH (ref 0.70–1.35)
Globulin: 5.1 g/dL (calc) — ABNORMAL HIGH (ref 1.9–3.7)
Glucose, Bld: 61 mg/dL — ABNORMAL LOW (ref 65–99)
Potassium: 4.6 mmol/L (ref 3.5–5.3)
Sodium: 138 mmol/L (ref 135–146)
Total Bilirubin: 0.7 mg/dL (ref 0.2–1.2)
Total Protein: 8.5 g/dL — ABNORMAL HIGH (ref 6.1–8.1)

## 2022-07-12 LAB — CBC WITH DIFFERENTIAL/PLATELET
Absolute Monocytes: 289 cells/uL (ref 200–950)
Basophils Absolute: 41 cells/uL (ref 0–200)
Basophils Relative: 1.1 %
Eosinophils Absolute: 340 cells/uL (ref 15–500)
Eosinophils Relative: 9.2 %
HCT: 32.5 % — ABNORMAL LOW (ref 38.5–50.0)
Hemoglobin: 10.9 g/dL — ABNORMAL LOW (ref 13.2–17.1)
Lymphs Abs: 1162 cells/uL (ref 850–3900)
MCH: 31 pg (ref 27.0–33.0)
MCHC: 33.5 g/dL (ref 32.0–36.0)
MCV: 92.3 fL (ref 80.0–100.0)
MPV: 11 fL (ref 7.5–12.5)
Monocytes Relative: 7.8 %
Neutro Abs: 1869 cells/uL (ref 1500–7800)
Neutrophils Relative %: 50.5 %
Platelets: 190 10*3/uL (ref 140–400)
RBC: 3.52 10*6/uL — ABNORMAL LOW (ref 4.20–5.80)
RDW: 14.6 % (ref 11.0–15.0)
Total Lymphocyte: 31.4 %
WBC: 3.7 10*3/uL — ABNORMAL LOW (ref 3.8–10.8)

## 2022-07-12 LAB — CULTURE, BLOOD (ROUTINE X 2): Culture: NO GROWTH

## 2022-07-12 LAB — PARATHYROID HORMONE, INTACT (NO CA): PTH: 18 pg/mL (ref 16–77)

## 2022-07-12 LAB — EXTRA SPECIMEN

## 2022-07-13 LAB — CULTURE, BLOOD (ROUTINE X 2)
Culture: NO GROWTH
Special Requests: ADEQUATE

## 2022-07-14 ENCOUNTER — Ambulatory Visit: Payer: Self-pay

## 2022-07-14 NOTE — Patient Outreach (Signed)
  Care Coordination   Initial Visit Note   07/14/2022 Name: Mark [name]. MRN: [id number] DOB: 1955/06/10  Mark [name]. is a 68 y.o. year old male who sees [name], MD for primary care. I spoke with  Mark [name]. by phone today.  What matters to the patients health and wellness today?  Get right knee better    Goals Addressed             This Visit's Progress    "Get Right Knee Better"       Patient Goals/Self Care Activities: -Patient/Caregiver will self-administer medications as prescribed as evidenced by self-report/primary caregiver report  -Patient/Caregiver will attend all scheduled provider appointments as evidenced by clinician review of documented attendance to scheduled appointments and patient/caregiver report -Patient/Caregiver will call provider office for new concerns or questions as evidenced by review of documented incoming telephone call notes and patient report  Discussed signs of infection to knee and when to call doctor.  Interventions Today    Flowsheet Row Most Recent Value  Chronic Disease   Chronic disease during today's visit Other  [Right knee infection]  General Interventions   General Interventions Discussed/Reviewed General Interventions Discussed  Education Interventions   Education Provided Provided Education  [Signs of knoee infection, importance of taking antibiotics, appointment with Dr. Megan Salon and making appointment with orthopedic doctor.]  Provided Verbal Education On When to see the doctor  Pharmacy Interventions   Pharmacy Dicussed/Reviewed Pharmacy Topics Discussed, Medications and their functions             SDOH assessments and interventions completed:  Yes  SDOH Interventions Today    Flowsheet Row Most Recent Value  SDOH Interventions   Housing Interventions Intervention Not Indicated  Transportation Interventions Intervention Not Indicated        Care Coordination Interventions:  Yes,  provided   Follow up plan: Follow up call scheduled for April    Encounter Outcome:  Pt. Visit Completed   Jone Baseman, RN, MSN [place] [facility] Care Management Coordinator Direct Line [phone]

## 2022-07-14 NOTE — Patient Instructions (Signed)
Visit Information  Thank you for taking time to visit with me today. Please don't hesitate to contact me if I can be of assistance to you.   Following are the goals we discussed today:   Goals Addressed             This Visit's Progress    "Get Right Knee Better"       Patient Goals/Self Care Activities: -Patient/Caregiver will self-administer medications as prescribed as evidenced by self-report/primary caregiver report  -Patient/Caregiver will attend all scheduled provider appointments as evidenced by clinician review of documented attendance to scheduled appointments and patient/caregiver report -Patient/Caregiver will call provider office for new concerns or questions as evidenced by review of documented incoming telephone call notes and patient report  Discussed signs of infection to knee and when to call doctor.  Interventions Today    Flowsheet Row Most Recent Value  Chronic Disease   Chronic disease during today's visit Other  [Right knee infection]  General Interventions   General Interventions Discussed/Reviewed General Interventions Discussed  Education Interventions   Education Provided Provided Education  [Signs of knoee infection, importance of taking antibiotics, appointment with Dr. Megan Salon and making appointment with orthopedic doctor.]  Provided Verbal Education On When to see the doctor  Pharmacy Interventions   Pharmacy Dicussed/Reviewed Pharmacy Topics Discussed, Medications and their functions             Our next appointment is by telephone on 07/28/22 at 1130  Please call the care guide team at [phone] if you need to cancel or reschedule your appointment.   If you are experiencing a Mental Health or [place] or need someone to talk to, please call the Suicide and Crisis Lifeline: 988   The patient verbalized understanding of instructions, educational materials, and care plan provided today and DECLINED offer to receive copy of  patient instructions, educational materials, and care plan.   The patient has been provided with contact information for the care management team and has been advised to call with any health related questions or concerns.   Jone Baseman, RN, MSN [place] [facility] Care Management Coordinator Direct Line [phone]

## 2022-07-16 ENCOUNTER — Telehealth: Payer: Self-pay

## 2022-07-16 ENCOUNTER — Ambulatory Visit: Payer: Medicare HMO | Admitting: Internal Medicine

## 2022-07-16 NOTE — Telephone Encounter (Signed)
Called patient to see if he would be able to make it to today's appointment, no answer. Left HIPAA compliant voicemail requesting callback.   Shant Hence D Kloie Whiting, RN  

## 2022-07-22 ENCOUNTER — Telehealth: Payer: Self-pay | Admitting: Family Medicine

## 2022-07-22 MED ORDER — FUROSEMIDE 20 MG PO TABS: 20.0000 mg | ORAL_TABLET | Freq: Two times a day (BID) | ORAL | 0 refills | Status: DC

## 2022-07-22 NOTE — Telephone Encounter (Signed)
Folic and thiamine was prescribed by another provider.   Attempted to reach pt to inform pt. Left a voicemail to call us back.   Sent in a Rx for Lasix.

## 2022-07-22 NOTE — Addendum Note (Signed)
Addended by: Encarnacion Slates on: 07/22/2022 04:33 PM   Modules accepted: Orders

## 2022-07-22 NOTE — Telephone Encounter (Signed)
Requesting refill of folic acid (FOLVITE) 1 MG tablet  furosemide (LASIX) 20 MG tablet thiamine (VITAMIN B-1) 100 MG tablet   [facility] [id number] [place], [place] - [address] AT [facility] Phone: [phone]  Fax: [phone]

## 2022-07-24 NOTE — Telephone Encounter (Signed)
Spoke to patient. He states he has not pick up the folic acid or vitamin B that sent in by his neurology.  Contact pt's pharmacy, spoke to [facility]. She states they still has the prescription. Advise them to fill it so pt can pick it up. She states vitamin B is not cover under pt's insurance but with discount card is $2.51.   Follow up with pt and update him of his Rx at pharmacy that he can pick it up. Verbalized understanding.

## 2022-07-28 ENCOUNTER — Ambulatory Visit: Payer: Self-pay

## 2022-07-28 NOTE — Patient Outreach (Signed)
  Care Coordination   Follow Up Visit Note   07/28/2022 Name: Mark [name]. MRN: [id number] DOB: 1955-12-22  Mark [name]. is a 67 y.o. year old male who sees [name], MD for primary care. I spoke with  Mark [name]. by phone today.  What matters to the patients health and wellness today?  Getting right knee better    Goals Addressed             This Visit's Progress    "Get Right Knee Better"       Patient Goals/Self Care Activities: -Patient/Caregiver will self-administer medications as prescribed as evidenced by self-report/primary caregiver report  -Patient/Caregiver will attend all scheduled provider appointments as evidenced by clinician review of documented attendance to scheduled appointments and patient/caregiver report -Patient/Caregiver will call provider office for new concerns or questions as evidenced by review of documented incoming telephone call notes and patient report  Discussed signs of infection to knee and when to call doctor Patient missed appointment with infectious disease.  Patient to reschedule.  Denies needing assistance to set appointment  Interventions Today    Flowsheet Row Most Recent Value  Chronic Disease   Chronic disease during today's visit Other  [right knee infection]  General Interventions   General Interventions Discussed/Reviewed General Interventions Reviewed  Education Interventions   Education Provided Provided Education  Provided Verbal Education On When to see the doctor  Safety Interventions   Safety Discussed/Reviewed Fall Risk             SDOH assessments and interventions completed:  Yes     Care Coordination Interventions:  Yes, provided   Follow up plan: Follow up call scheduled for april    Encounter Outcome:  Pt. Visit Completed   Jone Baseman, RN, MSN [place] [facility] Care Management Coordinator Direct Line [phone]

## 2022-07-28 NOTE — Patient Instructions (Signed)
Visit Information  Thank you for taking time to visit with me today. Please don't hesitate to contact me if I can be of assistance to you.   Following are the goals we discussed today:   Goals Addressed             This Visit's Progress    "Get Right Knee Better"       Patient Goals/Self Care Activities: -Patient/Caregiver will self-administer medications as prescribed as evidenced by self-report/primary caregiver report  -Patient/Caregiver will attend all scheduled provider appointments as evidenced by clinician review of documented attendance to scheduled appointments and patient/caregiver report -Patient/Caregiver will call provider office for new concerns or questions as evidenced by review of documented incoming telephone call notes and patient report  Discussed signs of infection to knee and when to call doctor Patient missed appointment with infectious disease.  Patient to reschedule.  Denies needing assistance to set appointment  Interventions Today    Flowsheet Row Most Recent Value  Chronic Disease   Chronic disease during today's visit Other  [right knee infection]  General Interventions   General Interventions Discussed/Reviewed General Interventions Reviewed  Education Interventions   Education Provided Provided Education  Provided Verbal Education On When to see the doctor  Safety Interventions   Safety Discussed/Reviewed Fall Risk             Our next appointment is by telephone on 08/25/22 at 1100  Please call the care guide team at [phone] if you need to cancel or reschedule your appointment.   If you are experiencing a Mental Health or [place] or need someone to talk to, please call the Suicide and Crisis Lifeline: 988   The patient verbalized understanding of instructions, educational materials, and care plan provided today and DECLINED offer to receive copy of patient instructions, educational materials, and care plan.   We have  been unable to make contact with the patient for follow up. The care management team is available to follow up with the patient after provider conversation with the patient regarding recommendation for care management engagement and subsequent re-referral to the care management team.   Jone Baseman, RN, MSN [place] [facility] Care Management Coordinator Direct Line [phone]

## 2022-07-31 ENCOUNTER — Other Ambulatory Visit: Payer: Self-pay

## 2022-07-31 ENCOUNTER — Encounter: Payer: Self-pay | Admitting: Internal Medicine

## 2022-07-31 ENCOUNTER — Other Ambulatory Visit (HOSPITAL_COMMUNITY): Payer: Self-pay

## 2022-07-31 ENCOUNTER — Ambulatory Visit (INDEPENDENT_AMBULATORY_CARE_PROVIDER_SITE_OTHER): Payer: Medicare (Managed Care) | Admitting: Internal Medicine

## 2022-07-31 VITALS — BP 123/77 | HR 99 | Temp 98.0°F | Ht 68.0 in | Wt 127.0 lb

## 2022-07-31 DIAGNOSIS — T8453XD Infection and inflammatory reaction due to internal right knee prosthesis, subsequent encounter: Secondary | ICD-10-CM | POA: Diagnosis not present

## 2022-07-31 MED ORDER — DOXYCYCLINE HYCLATE 100 MG PO TABS: 100.0000 mg | ORAL_TABLET | Freq: Two times a day (BID) | ORAL | 11 refills | Status: DC

## 2022-07-31 NOTE — Progress Notes (Signed)
[facility]  Patient Active Problem List   Diagnosis Date Noted   MRSA bacteremia 09/21/2018    Priority: High   Infection of prosthetic right knee joint 09/20/2018    Priority: High   Epidural abscess 09/20/2018    Priority: High   S/P right TKA 05/02/2013    Priority: High   Right knee pain 07/08/2022   Cerebrovascular disease 07/08/2022   Renal cyst 07/08/2022   Protein-calorie malnutrition, severe [id number]   Toxic metabolic encephalopathy [id number]   Septic joint of right knee joint 11/08/2020   S/P right TKA reimplantation 08/02/2020   Infection of total left knee replacement 03/20/2020   Cirrhosis of liver without ascites    AKI (acute kidney injury)    Bacteremia    Spinal stenosis of lumbar region without neurogenic claudication    Aspiration pneumonia 09/24/2018   Abscess in epidural space of lumbar spine    Anemia of chronic disease 09/20/2018   Hypoalbuminemia 09/20/2018   Hypoglycemia without diagnosis of diabetes mellitus 09/20/2018   Hyperkalemia 05/18/2018   Edentulous 05/11/2018   Pancytopenia 05/10/2018   CAD (coronary artery disease) 05/10/2018   Hepatic encephalopathy 05/10/2018   Alcohol abuse 05/10/2018   GERD (gastroesophageal reflux disease) 05/10/2018   Duodenal ulcer    Renal failure    Acute on chronic anemia    Hypotension 06/17/2017   Hyponatremia 06/17/2017   Leg edema, right [id number]   Acute metabolic encephalopathy [id number]   Anemia, iron deficiency    Benign neoplasm of ascending colon    Hemorrhoids    Portal hypertensive gastropathy    Gastritis and gastroduodenitis    Esophageal varices without bleeding    H/O: CVA (cerebrovascular accident) 12/01/2013   ACS (acute coronary syndrome) 12/01/2013   Anasarca 12/01/2013   Polysubstance abuse 12/01/2013   CKD (chronic kidney disease) stage 3, GFR 30-59 ml/min 12/01/2013   Murmur 04/14/2013   Right inguinal hernia 12/26/2010    Cirrhosis, alcoholic [id number]    Patient's Medications  New Prescriptions   No medications on file  Previous Medications   ASPIRIN EC 81 MG TABLET    Take 81 mg by mouth 2 (two) times daily with a meal. Swallow whole.   FERROUS SULFATE 325 (65 FE) MG TABLET    Take 1 tablet (325 mg total) by mouth 2 (two) times daily with a meal. Take for two weeks as tolerated.   FOLIC ACID (FOLVITE) 1 MG TABLET    Take 1 tablet (1 mg total) by mouth daily.   FUROSEMIDE (LASIX) 20 MG TABLET    Take 1 tablet (20 mg total) by mouth 2 (two) times daily. TAKE 1 TABLET(20 MG) BY MOUTH TWICE DAILY Strength: 20 mg   NADOLOL (CORGARD) 20 MG TABLET    TAKE 1 TABLET BY MOUTH EVERY DAY   THIAMINE (VITAMIN B-1) 100 MG TABLET    Take 1 tablet (100 mg total) by mouth daily.  Modified Medications   Modified Medication Previous Medication   DOXYCYCLINE (VIBRA-TABS) 100 MG TABLET doxycycline (VIBRA-TABS) 100 MG tablet      Take 1 tablet (100 mg total) by mouth 2 (two) times daily.    Take 1 tablet (100 mg total) by mouth 2 (two) times daily.  Discontinued Medications   No medications on file    Subjective: Hilery is in for his routine hospital follow-up visit.  He has had recurrent bacteremia.  He had group A strep bacteremia in February  2019, MSSA bacteremia in January 2020 and MRSA bacteremia in May 2020.  His last bacteremia was complicated by thoracic spine infection.  He was supposed to be taking chronic doxycycline ever since that hospitalization.  However, he stopped taking it sometime late 2020.  He is not exactly sure when or why he stopped it.   He began having increased pain, swelling and "popping" in his prosthetic right knee last fall.  He thinks that this started after he quit doxycycline.  He cannot recall who replaced his knee about 8 years ago.  He saw Dr. Lindwood Qua who told him that the prosthesis was infected and loose.  He said that fluid was drained off of the knee.  It was reported the cultures grew  MRSA susceptible to doxycycline and daptomycin.  He was admitted to the hospital in late November and underwent resection arthroplasty and spacer placement.  No operative cultures were obtained. He has chronic renal insufficiency.  My partner discharged him on IV daptomycin and he completed 8 weeks of therapy on 05/02/2019 before switching back to chronic, suppressive oral doxycycline.    Unfortunately he stopped taking the doxycycline again.  His wife says that he was off about 3 weeks before he started having recurrent pain and swelling in his right prosthetic knee.  He was hospitalized again and underwent incision and drainage with polyexchange on 11/13/2020.  Cultures grew MRSA again which was still susceptible to doxycycline.  He was treated with IV daptomycin before switching back to chronic, suppressive doxycycline.  Unfortunately he was admitted to the hospital in early November.  An aspirate of his right prosthetic knee grew MRSA again which was still susceptible to doxycycline.  He did not undergo any surgery.  He was treated with daptomycin again and completed therapy on 04/09/2022 before transitioning back to doxycycline.  His knee is less swollen and less painful.  He did not have any problems tolerating his PICC or daptomycin.  He was admitted again on 07/07/2022 with confusion and the report from his family that he had been having increasing right knee pain.  Notes indicate that he was self-medicating with snorted heroin for his pain and that possibly he had been missing doses of his doxycycline.  He was evaluated by Dr. Adriana Mccallum who did not feel he had any significant effusion and no intervention was performed.  Dr. Alvan Dame did discuss management options with him.  Alwaleed told Dr. Alvan Dame that he was not interested in amputation but says that he now is willing to consider that.  He denies missing any doses of his doxycycline.  Review of Systems: Review of Systems  Constitutional:  Positive for  malaise/fatigue and weight loss. Negative for chills and diaphoresis.  Gastrointestinal:  Negative for abdominal pain, diarrhea, nausea and vomiting.  Musculoskeletal:  Positive for back pain and joint pain.    Past Medical History:  Diagnosis Date   Alcohol abuse    Anemia    Arthritis    Ascites    Cirrhosis    Coffee ground emesis    Dehydration 06/17/2017   Febrile illness    GERD (gastroesophageal reflux disease)    Heart murmur    History of blood transfusion    Hyperlipidemia    Hypertension    Leg swelling    Myocardial infarction 2012   Preop cardiovascular exam 04/14/2013   Sepsis 06/17/2017   Septic shock 06/18/2017   SIRS (systemic inflammatory response syndrome) 07/11/2017   Stroke 10/2009   TIA  Thrombocytopenia     Social History   Tobacco Use   Smoking status: Former    Packs/day: 0.50    Years: 10.00    Additional pack years: 0.00    Total pack years: 5.00    Types: Cigarettes    Quit date: 01/05/2016    Years since quitting: 6.5   Smokeless tobacco: Never   Tobacco comments:    Pt reports he quit smoke 3 months ago. 07/11/22-km,cma  Vaping Use   Vaping Use: Never used  Substance Use Topics   Alcohol use: Yes    Comment: 2-3 beers/day   Drug use: Yes    Types: Marijuana    Family History  Problem Relation Age of Onset   Hypertension Father    Diabetes Father    Dementia Mother    Lupus Sister     No Known Allergies  Objective: Vitals:   07/31/22 0954  BP: 123/77  Pulse: 99  Temp: 98 F (36.7 C)  Weight: 127 lb (57.6 kg)  Height: 5\' 8"  (1.727 m)   Body mass index is 19.31 kg/m.  Physical Exam Constitutional:      Comments: He has lost 25 pounds in the last 6 months.  He is accompanied by his wife.  Cardiovascular:     Rate and Rhythm: Normal rate.  Pulmonary:     Effort: Pulmonary effort is normal.  Musculoskeletal:     Comments: His right knee incision has healed nicely.  He has diffuse swelling of his knee.  His knee  is warm to the touch.  There is no unusual erythema.  He walks with the aid of crutches.  Psychiatric:        Mood and Affect: Mood normal.     Lab Results Sed Rate  Date Value  07/07/2022 130 mm/hr (H)  04/16/2022 >130 mm/h (H)  11/08/2020 >140 mm/hr (H)   CRP  Date Value  07/07/2022 3.7 mg/dL (H)  04/16/2022 12.8 mg/L (H)  11/08/2020 3.6 mg/dL (H)      Problem List Items Addressed This Visit       High   Infection of prosthetic right knee joint - Primary    I told him again that the only way that his prosthetic joint infection would ever be cured was the 2 remove all of the old hardware.  This could possibly be accomplished by a two-stage reconstruction but that carries risk given his previous surgeries.  I also discussed the potential benefit of above-the-knee amputation.  I suggested that he schedule a consultation with Dr. Meridee Score to discuss what amputation would entail.  He will follow-up here in 2 months and continue doxycycline for now.      Michel Bickers, MD [facility] [phone] pager   [phone] cell 07/31/2022, 10:24 AM

## 2022-07-31 NOTE — Assessment & Plan Note (Signed)
I told him again that the only way that his prosthetic joint infection would ever be cured was the 2 remove all of the old hardware.  This could possibly be accomplished by a two-stage reconstruction but that carries risk given his previous surgeries.  I also discussed the potential benefit of above-the-knee amputation.  I suggested that he schedule a consultation with Dr. Meridee Score to discuss what amputation would entail.  He will follow-up here in 2 months and continue doxycycline for now.

## 2022-08-04 ENCOUNTER — Other Ambulatory Visit: Payer: Self-pay | Admitting: Family Medicine

## 2022-08-04 ENCOUNTER — Telehealth: Payer: Self-pay

## 2022-08-04 ENCOUNTER — Telehealth: Payer: Self-pay | Admitting: Family Medicine

## 2022-08-04 NOTE — Telephone Encounter (Signed)
-----   Message from Nadara Mustard, MD sent at 08/04/2022  9:38 AM EDT ----- Call patient.  Dr. Orvan Falconer wants [place] to discussed with patient possibility of amputation, he has a chronic infected total knee. ----- Message ----- From: Cliffton Asters, MD Sent: 07/31/2022  10:24 AM EDT To: Nadara Mustard, MD; Deeann Saint, MD

## 2022-08-04 NOTE — Telephone Encounter (Signed)
I called and sw pt and offered an appt for this afternoon and pt said that he could not make it due to transportation. Appt made for tomorrow morning at 10:30 with Dr. Lajoyce [name] will call with any questions.

## 2022-08-04 NOTE — Telephone Encounter (Signed)
Prescription Request  08/04/2022  LOV: 07/11/2022 pt states he is out of medication (initial request was sent to the wrong provider) and swelling up  What is the name of the medication or equipment? furosemide (LASIX) 20 MG tablet  Have you contacted your pharmacy to request a refill? Yes   Which pharmacy would you like this sent to?  [id number] - [place], [place] - [address] AT [address] [place] [address] Phone: [phone] Fax: [phone]     Patient notified that their request is being sent to the clinical staff for review and that they should receive a response within 2 business days.   Please advise at Mobile [phone] (mobile)

## 2022-08-04 NOTE — Telephone Encounter (Signed)
Refill sent.

## 2022-08-05 ENCOUNTER — Ambulatory Visit (INDEPENDENT_AMBULATORY_CARE_PROVIDER_SITE_OTHER): Payer: Medicare (Managed Care) | Admitting: Orthopedic Surgery

## 2022-08-05 DIAGNOSIS — M009 Pyogenic arthritis, unspecified: Secondary | ICD-10-CM

## 2022-08-12 ENCOUNTER — Encounter: Payer: Self-pay | Admitting: Family Medicine

## 2022-08-13 ENCOUNTER — Telehealth: Payer: Self-pay | Admitting: Family Medicine

## 2022-08-13 NOTE — Telephone Encounter (Signed)
Contacted Mark [name]. to schedule their annual wellness visit. Appointment made for 08/21/22.  [name] AWV direct phone # [phone]

## 2022-08-17 ENCOUNTER — Encounter: Payer: Self-pay | Admitting: Orthopedic Surgery

## 2022-08-17 NOTE — Progress Notes (Signed)
Office Visit Note   Patient: Mark [name].           Date of Birth: 06-10-1955           MRN: [id number] Visit Date: 08/05/2022              Requested by: Deeann Saint, MD [address] [place],  [place] [address] PCP: Deeann Saint, MD  Chief Complaint  Patient presents with   Right Knee - Pain    Referral from infectious disease for infected right knee replacement from 2015      HPI: Patient is a 67 year old gentleman who is seen in consultation for infectious disease initial evaluation.  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  Patient states that he had a history of a spinal infection about a year before his total knee arthroplasty.  Patient has undergone total knee arthroplasty and revisions with Dr. Constance Goltz.  Initial right total knee arthroplasty in 2015 with an antibiotic spacer placed in 2021.  Reimplantation of antibiotic spacer April 2022 irrigation debridement right knee July 2022.  Status post revision total knee arthroplasty.  Assessment & Plan: Visit Diagnoses:  1. Pyogenic arthritis of right knee joint, due to unspecified organism     Plan: With patient's multiple attempts of limb salvage with persistent infection and retained implant patient have recommended proceeding with a above-the-knee amputation.  Risks and benefits were discussed including persistent infection need for additional surgery.  With patient's stemmed femoral component he may require a higher level above-knee amputation.  Would try removal of the stem and antibiotics with attempted salvage of as much femur as possible.  Patient states that he would like to proceed with above-knee amputation surgery after May 12.  Follow-Up Instructions: No follow-ups on file.   Ortho Exam  Patient is alert, oriented, no adenopathy, well-dressed, normal affect, normal respiratory effort. Examination patient has right knee range of  motion from 45 to 90 degrees with limited range of motion there is pain to palpation.  Radiograph shows a stemmed femoral and tibial component with loose cement in the femur.  Imaging: No results found. No images are attached to the encounter.  Labs: Lab Results  Component Value Date   HGBA1C  11/01/2009    5.1 (NOTE)                                                                       According to the ADA Clinical Practice Recommendations for 2011, when HbA1c is used as a screening test:   >=6.5%   Diagnostic of Diabetes Mellitus           (if abnormal result  is confirmed)  5.7-6.4%   Increased risk of developing Diabetes Mellitus  References:Diagnosis and Classification of Diabetes Mellitus,Diabetes Care,2011,34(Suppl 1):S62-S69 and Standards of Medical Care in         Diabetes - 2011,Diabetes Care,2011,34  (Suppl 1):S11-S61.   ESRSEDRATE 130 (H) 07/07/2022   ESRSEDRATE >130 (H) 04/16/2022   ESRSEDRATE >140 (H) 11/08/2020   CRP 3.7 (H) 07/07/2022   CRP 12.8 (H) 04/16/2022   CRP 3.6 (H) 11/08/2020   REPTSTATUS 07/13/2022 FINAL 07/08/2022   GRAMSTAIN  NO WBC SEEN NO ORGANISMS SEEN  02/26/2022   CULT  07/08/2022    NO GROWTH 5 DAYS Performed at [facility], [address] [place], [place] [address]    LABORGA METHICILLIN RESISTANT STAPHYLOCOCCUS AUREUS 02/26/2022     Lab Results  Component Value Date   ALBUMIN 3.1 (L) 07/08/2022   ALBUMIN 3.3 (L) 07/07/2022   ALBUMIN 3.4 (A) 04/01/2022   PREALBUMIN <5 (L) 09/21/2018   PREALBUMIN <5 (L) 07/11/2017    Lab Results  Component Value Date   MG 1.9 11/11/2020   MG 1.6 (L) 10/05/2018   MG 1.6 (L) 10/04/2018   Lab Results  Component Value Date   VD25OH 15 (L) 06/29/2014   VD25OH 10 (L) 12/01/2013    Lab Results  Component Value Date   PREALBUMIN <5 (L) 09/21/2018   PREALBUMIN <5 (L) 07/11/2017      Latest Ref Rng & Units 07/11/2022    2:59 PM 07/08/2022    1:21 AM 07/07/2022    9:49 PM  CBC EXTENDED  WBC  3.8 - 10.8 Thousand/uL 3.7  3.6  2.8   RBC 4.20 - 5.80 Million/uL 3.52  2.81  2.99   Hemoglobin 13.2 - 17.1 g/dL 29.5  8.6  9.2   HCT 28.4 - 50.0 % 32.5  27.7  29.8   Platelets 140 - 400 Thousand/uL 190  170  80   NEUT# 1,500 - 7,800 cells/uL 1,869     Lymph# 850 - 3,900 cells/uL 1,162        There is no height or weight on file to calculate BMI.  Orders:  No orders of the defined types were placed in this encounter.  No orders of the defined types were placed in this encounter.    Procedures: No procedures performed  Clinical Data: No additional findings.  ROS:  All other systems negative, except as noted in the HPI. Review of Systems  Objective: Vital Signs: There were no vitals taken for this visit.  Specialty Comments:  No specialty comments available.  PMFS History: Patient Active Problem List   Diagnosis Date Noted   Right knee pain 07/08/2022   Cerebrovascular disease 07/08/2022   Renal cyst 07/08/2022   Protein-calorie malnutrition, severe 07/08/2022   Toxic metabolic encephalopathy 02/26/2022   Septic joint of right knee joint 11/08/2020   S/P right TKA reimplantation 08/02/2020   Infection of total left knee replacement 03/20/2020   Cirrhosis of liver without ascites    AKI (acute kidney injury)    Bacteremia    Spinal stenosis of lumbar region without neurogenic claudication    Aspiration pneumonia 09/24/2018   Abscess in epidural space of lumbar spine    MRSA bacteremia 09/21/2018   Infection of prosthetic right knee joint 09/20/2018   Anemia of chronic disease 09/20/2018   Hypoalbuminemia 09/20/2018   Hypoglycemia without diagnosis of diabetes mellitus 09/20/2018   Epidural abscess 09/20/2018   Hyperkalemia 05/18/2018   Edentulous 05/11/2018   Pancytopenia 05/10/2018   CAD (coronary artery disease) 05/10/2018   Hepatic encephalopathy 05/10/2018   Alcohol abuse 05/10/2018   GERD (gastroesophageal reflux disease) 05/10/2018   Duodenal ulcer     Renal failure    Acute on chronic anemia    Hypotension 06/17/2017   Hyponatremia 06/17/2017   Leg edema, right 06/17/2017   Acute metabolic encephalopathy 06/17/2017   Anemia, iron deficiency    Benign neoplasm of ascending colon    Hemorrhoids    Portal hypertensive gastropathy  Gastritis and gastroduodenitis    Esophageal varices without bleeding    H/O: CVA (cerebrovascular accident) 12/01/2013   ACS (acute coronary syndrome) 12/01/2013   Anasarca 12/01/2013   Polysubstance abuse 12/01/2013   CKD (chronic kidney disease) stage 3, GFR 30-59 ml/min 12/01/2013   S/P right TKA 05/02/2013   Murmur 04/14/2013   Right inguinal hernia 12/26/2010   Cirrhosis, alcoholic 12/20/2010   Past Medical History:  Diagnosis Date   Alcohol abuse    Anemia    Arthritis    Ascites    Cirrhosis    Coffee ground emesis    Dehydration 06/17/2017   Febrile illness    GERD (gastroesophageal reflux disease)    Heart murmur    History of blood transfusion    Hyperlipidemia    Hypertension    Leg swelling    Myocardial infarction 2012   Preop cardiovascular exam 04/14/2013   Sepsis 06/17/2017   Septic shock 06/18/2017   SIRS (systemic inflammatory response syndrome) 07/11/2017   Stroke 10/2009   TIA   Thrombocytopenia     Family History  Problem Relation Age of Onset   Hypertension Father    Diabetes Father    Dementia Mother    Lupus Sister     Past Surgical History:  Procedure Laterality Date   COLONOSCOPY WITH PROPOFOL N/A 02/07/2016   Procedure: COLONOSCOPY WITH PROPOFOL;  Surgeon: Rachael Fee, MD;  Location: [facility];  Service: Endoscopy;  Laterality: N/A;   ESOPHAGOGASTRODUODENOSCOPY (EGD) WITH PROPOFOL N/A 02/07/2016   Procedure: ESOPHAGOGASTRODUODENOSCOPY (EGD) WITH PROPOFOL;  Surgeon: Rachael Fee, MD;  Location: [facility];  Service: Endoscopy;  Laterality: N/A;   ESOPHAGOGASTRODUODENOSCOPY (EGD) WITH PROPOFOL N/A 06/22/2017   Procedure:  ESOPHAGOGASTRODUODENOSCOPY (EGD) WITH PROPOFOL;  Surgeon: Beverley Fiedler, MD;  Location: [facility];  Service: Gastroenterology;  Laterality: N/A;   EXCISIONAL TOTAL KNEE ARTHROPLASTY WITH ANTIBIOTIC SPACERS Right 03/20/2020   Procedure: Resection right total knee arthroplasty and placement of antibiotic spacer;  Surgeon: Durene Romans, MD;  Location: [facility];  Service: Orthopedics;  Laterality: Right;  90 mins   HERNIA REPAIR Right    inguinal   INGUINAL HERNIA REPAIR Left 02/08/2021   Procedure: OPEN LEFT INGUINAL HERNIA REPAIR WITH MESH;  Surgeon: Abigail Miyamoto, MD;  Location[facility];  Service: General;  Laterality: Left;   IRRIGATION AND DEBRIDEMENT KNEE Right 11/13/2020   Procedure: IRRIGATION AND DEBRIDEMENT KNEE;  Surgeon: Durene Romans, MD;  Location: [facility];  Service: Orthopedics;  Laterality: Right;   JOINT REPLACEMENT     KNEE ARTHROSCOPY     bilateral/  12/14   REIMPLANTATION OF TOTAL KNEE Right 08/02/2020   Procedure: REIMPLANTATION/REVISION OF TOTAL KNEE WITH REMOVAL OF ANTIBIOTIC SPACER;  Surgeon: Durene Romans, MD;  Location: [facility];  Service: Orthopedics;  Laterality: Right;    TEE WITHOUT CARDIOVERSION N/A 05/13/2018   Procedure: TRANSESOPHAGEAL ECHOCARDIOGRAM (TEE);  Surgeon: Parke Poisson, MD;  Location: [facility];  Service: Cardiovascular;  Laterality: N/A;   TOTAL KNEE ARTHROPLASTY Right 05/02/2013   Procedure: RIGHT TOTAL KNEE ARTHROPLASTY;  Surgeon: Shelda Pal, MD;  Location: [facility];  Service: Orthopedics;  Laterality: Right;   Social History   Occupational History   Occupation: Maintenance    Employer: [employer]  Tobacco Use   Smoking status: Former    Packs/day: 0.50    Years: 10.00    Additional pack years: 0.00    Total pack years: 5.00    Types: Cigarettes  Quit date: 01/05/2016    Years since quitting: 6.6   Smokeless tobacco: Never   Tobacco comments:    Pt reports he quit smoke 3 months ago. 07/11/22-km,cma  Vaping Use    Vaping Use: Never used  Substance and Sexual Activity   Alcohol use: Yes    Comment: 2-3 beers/day   Drug use: Yes    Types: Marijuana   Sexual activity: Not Currently    Comment: now and then smokies marijuana

## 2022-08-19 NOTE — Progress Notes (Unsigned)
PATIENT CHECK-IN and HEALTH RISK ASSESSMENT QUESTIONNAIRE:  -completed by phone/video for upcoming Medicare Preventive Visit  Pre-Visit Check-in: 1)Vitals (height, wt, BP, etc) - record in vitals section for visit on day of visit 2)Review and Update Medications, Allergies PMH, Surgeries, Social history in Epic 3)Hospitalizations in the last year with date/reason? Yes, Right Knee surgery  4)Review and Update Care Team (patient's specialists) in Epic 5) Complete PHQ9 in Epic  6) Complete Fall Screening in Epic 7)Review all Health Maintenance Due and order under PCP if not done.  Medicare Wellness Patient Questionnaire:  Answer theses question about your habits: Do you drink alcohol? Yes If yes, how many drinks do you have a day?40 oz - beer, reports is not interested in cutting back Have you ever smoked?Yes Quit date if applicable? 6 months  How many packs a day do/did you smoke? 5 cigs Do you use smokeless tobacco?No Do you use an illicit drugs? No Do you exercises? Yes IF so, what type and how many days/minutes per week?Everyday, Squats, bicycle and weight MWF - 1.5 hours.  Are you sexually active?  No Number of partners? 0 Typical breakfast: Eggs, Waffles, Oatmeal Reports eats lots of veggies and fruit - feels eats healthy, cooks mostly  Typical lunch: Varies - succatosh, salmon, bread Typical dinner: Varies Typical snacks: N/A  Beverages: Tea/Lemonade Answer theses question about you: Can you perform most household chores? Yes Do you find it hard to follow a conversation in a noisy room?No Do you often ask people to speak up or repeat themselves?No Do you feel that you have a problem with memory?No Do you balance your checkbook and or bank acounts?Yes Do you feel safe at home?Yes Last dentist visit? 1 year ago Do you need assistance with any of the following: Please note if so No  Driving?  Feeding yourself?  Getting from bed to chair?  Getting to the toilet?  Bathing or  showering?  Dressing yourself?  Managing money?  Climbing a flight of stairs  Preparing meals?    Do you have Advanced Directives in place (Living Will, Healthcare Power or Attorney)? No   Last eye Exam and location? Dr. Dione Booze, 6 months ago   Do you currently use prescribed or non-prescribed narcotic or opioid pain medications? No  Do you have a history or close family history of breast, ovarian, tubal or peritoneal cancer or a family member with BRCA (breast cancer susceptibility 1 and 2) gene mutations? No  Nurse/Assistant Credentials/time stamp: MG 9:35 AM    ----------------------------------------------------------------------------------------------------------------------------------------------------------------------------------------------------------------------    MEDICARE ANNUAL PREVENTIVE CARE VISIT WITH PROVIDER (Welcome to Medicare, initial annual wellness or annual wellness exam)  Virtual Visit via Phone Note  I connected with Mark [name]. on 08/21/22  by phone  and verified that I am speaking with the correct person using two identifiers.  Location patient: home Location provider:work or home office Persons participating in the virtual visit: patient, provider  Concerns and/or follow up today: stable   See HM section in Epic for other details of completed HM.    ROS: negative for report of fevers, unintentional weight loss, vision changes, vision loss, hearing loss or change, chest pain, sob, hemoptysis, melena, hematochezia, hematuria, falls, bleeding or bruising, thoughts of suicide or self harm, memory loss  Patient-completed extensive health risk assessment - reviewed and discussed with the patient: See Health Risk Assessment completed with patient prior to the visit either above or in recent phone note. This was reviewed in detailed with the  patient today and appropriate recommendations, orders and referrals were placed as needed per Summary  below and patient instructions.   Review of Medical History: -PMH, PSH, Family History and current specialty and care providers reviewed and updated and listed below   Patient Care Team: Deeann Saint, MD as PCP - General (Family Medicine) Wendall Stade, MD as PCP - Cardiology (Cardiology) Fleeta Emmer, RN as [facility] Care Management   Past Medical History:  Diagnosis Date   Alcohol abuse    Anemia    Arthritis    Ascites    Cirrhosis    Coffee ground emesis    Dehydration 06/17/2017   Febrile illness    GERD (gastroesophageal reflux disease)    Heart murmur    History of blood transfusion    Hyperlipidemia    Hypertension    Leg swelling    Myocardial infarction 2012   Preop cardiovascular exam 04/14/2013   Sepsis 06/17/2017   Septic shock 06/18/2017   SIRS (systemic inflammatory response syndrome) 07/11/2017   Stroke 10/2009   TIA   Thrombocytopenia     Past Surgical History:  Procedure Laterality Date   COLONOSCOPY WITH PROPOFOL N/A 02/07/2016   Procedure: COLONOSCOPY WITH PROPOFOL;  Surgeon: Rachael Fee, MD;  Location: [facility];  Service: Endoscopy;  Laterality: N/A;   ESOPHAGOGASTRODUODENOSCOPY (EGD) WITH PROPOFOL N/A 02/07/2016   Procedure: ESOPHAGOGASTRODUODENOSCOPY (EGD) WITH PROPOFOL;  Surgeon: Rachael Fee, MD;  Location: [facility];  Service: Endoscopy;  Laterality: N/A;   ESOPHAGOGASTRODUODENOSCOPY (EGD) WITH PROPOFOL N/A 06/22/2017   Procedure: ESOPHAGOGASTRODUODENOSCOPY (EGD) WITH PROPOFOL;  Surgeon: Beverley Fiedler, MD;  Location: [facility] ENDOSCOPY;  Service: Gastroenterology;  Laterality: N/A;   EXCISIONAL TOTAL KNEE ARTHROPLASTY WITH ANTIBIOTIC SPACERS Right 03/20/2020   Procedure: Resection right total knee arthroplasty and placement of antibiotic spacer;  Surgeon: Durene Romans, MD;  Location: [facility];  Service: Orthopedics;  Laterality: Right;  90 mins   HERNIA REPAIR Right    inguinal   INGUINAL HERNIA REPAIR Left  02/08/2021   Procedure: OPEN LEFT INGUINAL HERNIA REPAIR WITH MESH;  Surgeon: Abigail Miyamoto, MD;  Location: [facility] OR;  Service: General;  Laterality: Left;   IRRIGATION AND DEBRIDEMENT KNEE Right 11/13/2020   Procedure: IRRIGATION AND DEBRIDEMENT KNEE;  Surgeon: Durene Romans, MD;  Location: [facility];  Service: Orthopedics;  Laterality: Right;   JOINT REPLACEMENT     KNEE ARTHROSCOPY     bilateral/  12/14   REIMPLANTATION OF TOTAL KNEE Right 08/02/2020   Procedure: REIMPLANTATION/REVISION OF TOTAL KNEE WITH REMOVAL OF ANTIBIOTIC SPACER;  Surgeon: Durene Romans, MD;  Location: [facility];  Service: Orthopedics;  Laterality: Right;    TEE WITHOUT CARDIOVERSION N/A 05/13/2018   Procedure: TRANSESOPHAGEAL ECHOCARDIOGRAM (TEE);  Surgeon: Parke Poisson, MD;  Location: [facility] ENDOSCOPY;  Service: Cardiovascular;  Laterality: N/A;   TOTAL KNEE ARTHROPLASTY Right 05/02/2013   Procedure: RIGHT TOTAL KNEE ARTHROPLASTY;  Surgeon: Shelda Pal, MD;  Location: [facility];  Service: Orthopedics;  Laterality: Right;    Social History   Socioeconomic History   Marital status: Married    Spouse name: Not on file   Number of children: 4   Years of education: Not on file   Highest education level: Not on file  Occupational History   Occupation: Maintenance    Employer: [employer]  Tobacco Use   Smoking status: Former    Packs/day: 0.50    Years: 10.00    Additional pack  years: 0.00    Total pack years: 5.00    Types: Cigarettes    Quit date: 01/05/2016    Years since quitting: 6.6   Smokeless tobacco: Never   Tobacco comments:    Pt reports he quit smoke 3 months ago. 07/11/22-km,cma  Vaping Use   Vaping Use: Never used  Substance and Sexual Activity   Alcohol use: Yes    Comment: 2-3 beers/day   Drug use: Yes    Types: Marijuana   Sexual activity: Not Currently    Comment: now and then smokies marijuana  Other Topics Concern   Not on file  Social History Narrative   Not on file    Social Determinants of Health   Financial Resource Strain: Low Risk  (05/16/2021)   Overall Financial Resource Strain (CARDIA)    Difficulty of Paying Living Expenses: Not hard at all  Food Insecurity: No Food Insecurity (07/10/2022)   Hunger Vital Sign    Worried About Running Out of Food in the Last Year: Never true    Ran Out of Food in the Last Year: Never true  Transportation Needs: No Transportation Needs (07/14/2022)   PRAPARE - Administrator, Civil Service (Medical): No    Lack of Transportation (Non-Medical): No  Physical Activity: Sufficiently Active (05/16/2021)   Exercise Vital Sign    Days of Exercise per Week: 7 days    Minutes of Exercise per Session: 40 min  Stress: No Stress Concern Present (05/16/2021)   [employer] of Occupational Health - Occupational Stress Questionnaire    Feeling of Stress : Not at all  Social Connections: Socially Integrated (05/16/2021)   Social Connection and Isolation Panel [NHANES]    Frequency of Communication with Friends and Family: More than three times a week    Frequency of Social Gatherings with Friends and Family: More than three times a week    Attends Religious Services: More than 4 times per year    Active Member of [employer] or Organizations: Yes    Attends Banker Meetings: More than 4 times per year    Marital Status: Married  Catering manager Violence: Patient Declined (02/28/2022)   Humiliation, Afraid, Rape, and Kick questionnaire    Fear of Current or Ex-Partner: Patient declined    Emotionally Abused: Patient declined    Physically Abused: Patient declined    Sexually Abused: Patient declined    Family History  Problem Relation Age of Onset   Hypertension Father    Diabetes Father    Dementia Mother    Lupus Sister     Current Outpatient Medications on File Prior to Visit  Medication Sig Dispense Refill   aspirin EC 81 MG tablet Take 81 mg by mouth 2 (two) times daily with a meal.  Swallow whole.     doxycycline (VIBRA-TABS) 100 MG tablet Take 1 tablet (100 mg total) by mouth 2 (two) times daily. 60 tablet 11   ferrous sulfate 325 (65 FE) MG tablet Take 1 tablet (325 mg total) by mouth 2 (two) times daily with a meal. Take for two weeks as tolerated. (Patient taking differently: Take 325 mg by mouth 2 (two) times daily with a meal.) 180 tablet 2   folic acid (FOLVITE) 1 MG tablet Take 1 tablet (1 mg total) by mouth daily. 30 tablet 0   furosemide (LASIX) 20 MG tablet TAKE 1 TABLET(20 MG) BY MOUTH TWICE DAILY (Patient not taking: Reported on 08/21/2022) 180 tablet 0  nadolol (CORGARD) 20 MG tablet TAKE 1 TABLET BY MOUTH EVERY DAY (Patient not taking: Reported on 08/21/2022) 30 tablet 1   thiamine (VITAMIN B-1) 100 MG tablet Take 1 tablet (100 mg total) by mouth daily. (Patient not taking: Reported on 08/21/2022) 30 tablet 0   No current facility-administered medications on file prior to visit.    No Known Allergies     Physical Exam There were no vitals filed for this visit. Estimated body mass index is 19.31 kg/m as calculated from the following:   Height as of 07/31/22: 5\' 8"  (1.727 m).   Weight as of 07/31/22: 127 lb (57.6 kg).  EKG (optional): deferred due to virtual visit  GENERAL: alert, oriented, no acute distress detected; full vision exam deferred due to pandemic and/or virtual encounter PSYCH/NEURO: pleasant and cooperative, no obvious depression or anxiety, speech and thought processing grossly intact, Cognitive function grossly intact  Flowsheet Row Office Visit from 08/21/2022 in [facility]  PHQ-9 Total Score 1           08/21/2022    9:27 AM 07/31/2022    9:55 AM 07/14/2022    1:21 PM 04/16/2022   10:24 AM 05/16/2021   11:29 AM  Depression screen PHQ 2/9  Decreased Interest 0 0 0 0 0  Down, Depressed, Hopeless 1 0 0 1 0  PHQ - 2 Score 1 0 0 1 0  Altered sleeping 0      Tired, decreased energy 0      Change in  appetite 0      Feeling bad or failure about yourself  0      Trouble concentrating 0      Moving slowly or fidgety/restless 0      Suicidal thoughts 0      PHQ-9 Score 1      Difficult doing work/chores Not difficult at all      No trouble with depression per his report.      03/02/2022    9:31 PM 03/03/2022   11:00 AM 04/16/2022   10:24 AM 07/31/2022    9:55 AM 08/21/2022    9:29 AM  Fall Risk  Falls in the past year?   1 0 1  Was there an injury with Fall?   0 0 0  Fall Risk Category Calculator   2 0 2  Fall Risk Category (Retired)   Moderate    (RETIRED) Patient Fall Risk Level High fall risk High fall risk Moderate fall risk    Patient at Risk for Falls Due to    Impaired balance/gait;Other (Comment) No Fall Risks  Fall risk Follow up   Falls evaluation completed  Falls evaluation completed  One fall - mis-step in the garden. Doesn't feel balance is too ok.    SUMMARY AND PLAN:  Encounter for Medicare annual wellness exam  Discussed applicable health maintenance/preventive health measures and advised and referred or ordered per patient preferences: -he refuses all measures due at this time Health Maintenance  Topic Date Due   COVID-19 Vaccine (1) 09/06/2022 (Originally 02/24/1956)   Zoster Vaccines- Shingrix (1 of 2) 11/20/2022 (Originally 08/26/2003)   Pneumonia Vaccine 33+ Years old (1 of 2 - PCV) 08/21/2023 (Originally 08/26/1961)   INFLUENZA VACCINE  11/27/2022   Medicare Annual Wellness (AWV)  08/21/2023   DTaP/Tdap/Td (2 - Td or Tdap) 12/06/2023   COLONOSCOPY (Pts 45-76yrs Insurance coverage will need to be confirmed)  02/06/2026   Hepatitis C Screening  Completed  HPV VACCINES  Aged [employer] and counseling on the following was provided based on the above review of health and a plan/checklist for the patient, along with additional information discussed, was provided for the patient in the patient instructions :  -Advised on importance of completing advanced  directives, discussed options for completing and provided information in patient instructions as well -Provided counseling and plan for increased risk of falling if applicable per above screening. Reviewed and demonstrated safe balance exercises that can be done at home to improve balance and discussed exercise guidelines for adults with include balance exercises at least 3 days per week.  -Advised and counseled on a healthy lifestyle - including the importance of a healthy diet, regular physical activity, social connections and stress management. -Reviewed patient's current diet. Advised and counseled on a whole foods based healthy diet. A summary of a healthy diet was provided in the Patient Instructions.  -reviewed patient's current physical activity level and discussed exercise guidelines for adults. Discussed community resources and ideas for safe exercise at home to assist in meeting exercise guideline recommendations in a safe and healthy way.  -Advise yearly dental visits at minimum and regular eye exams -Advised and counseled on alcohol safe limits, risks - currently in pre-contemplation stage and has not interest in cutting back or quitting. So, provided brief counseling on risks, safer drinking levels, and advised to reach out if further questions.   Follow up: see patient instructions   Patient Instructions  I really enjoyed getting to talk with you today! I am available on Tuesdays and Thursdays for virtual visits if you have any questions or concerns, or if I can be of any further assistance.   CHECKLIST FROM ANNUAL WELLNESS VISIT:  -Follow up (please call to schedule if not scheduled after visit):   -yearly for annual wellness visit with primary care office  Here is a list of your preventive care/health maintenance measures and the plan for each if any are due:  PLAN For any measures below that may be due:  -if you change your mind or have about questions about ant of the  vaccines or other measures please let your primary care doctor know.   Health Maintenance  Topic Date Due   COVID-19 Vaccine (1) 09/06/2022 (Originally 02/24/1956)   Zoster Vaccines- Shingrix (1 of 2) 11/20/2022 (Originally 08/26/2003)   Pneumonia Vaccine 34+ Years old (1 of 2 - PCV) 08/21/2023 (Originally 08/26/1961)   INFLUENZA VACCINE  11/27/2022   Medicare Annual Wellness (AWV)  08/21/2023   DTaP/Tdap/Td (2 - Td or Tdap) 12/06/2023   COLONOSCOPY (Pts 45-54yrs Insurance coverage will need to be confirmed)  02/06/2026   Hepatitis C Screening  Completed   HPV VACCINES  Aged Out    -See a dentist at least yearly  -Get your eyes checked and then per your eye specialist's recommendations  -Other issues addressed today:  -please consider cutting back or quitting alcohol, your current drinking level can cause significant harm to your health  -I have included below further information regarding a healthy whole foods based diet, physical activity guidelines for adults, stress management and opportunities for social connections. I hope you find this information useful.   -----------------------------------------------------------------------------------------------------------------------------------------------------------------------------------------------------------------------------------------------------------  NUTRITION: -eat real food: lots of colorful vegetables (half the plate) and fruits -5-7 servings of vegetables and fruits per day (fresh or steamed is best), exp. 2 servings of vegetables with lunch and dinner and 2 servings of fruit per day. Berries and greens such as  kale and collards are great choices.  -consume on a regular basis: whole grains (make sure first ingredient on label contains the word "whole"), fresh fruits, fish, nuts, seeds, healthy oils (such as olive oil, avocado oil, grape seed oil) -may eat small amounts of dairy and lean meat on occasion, but avoid  processed meats such as ham, bacon, lunch meat, etc. -drink water -try to avoid fast food and pre-packaged foods, processed meat -most experts advise limiting sodium to <  per day, should limit further is any chronic conditions such as high blood pressure, heart disease, diabetes, etc. The American Heart Association advised that <  is is ideal -try to avoid foods that contain any ingredients with names you do not recognize  -try to avoid sugar/sweets (except for the natural sugar that occurs in fresh fruit) -try to avoid sweet drinks -try to avoid white rice, white bread, pasta (unless whole grain), white or yellow potatoes  EXERCISE GUIDELINES FOR ADULTS: -if you wish to increase your physical activity, do so gradually and with the approval of your doctor -STOP and seek medical care immediately if you have any chest pain, chest discomfort or trouble breathing when starting or increasing exercise  -move and stretch your body, legs, feet and arms when sitting for long periods -Physical activity guidelines for optimal health in adults: -least 150 minutes per week of aerobic exercise (can talk, but not sing) once approved by your doctor, 20-30 minutes of sustained activity or two 10 minute episodes of sustained activity every day.  -resistance training at least 2 days per week if approved by your doctor -balance exercises 3+ days per week:   Stand somewhere where you have something sturdy to hold onto if you lose balance.    1) lift up on toes, start with 5x per day and work up to 20x   2) stand and lift on leg straight out to the side so that foot is a few inches of the floor, start with 5x each side and work up to 20x each side   3) stand on one foot, start with 5 seconds each side and work up to 20 seconds on each side  If you need ideas or help with getting more active:  -Silver sneakers https://tools.silversneakers.com  -Walk with a Doc: http://www.duncan-williams.com/  -try to  include resistance (weight lifting/strength building) and balance exercises twice per week: or the following link for ideas: http://castillo-powell.com/  BuyDucts.dk  STRESS MANAGEMENT: -can try meditating, or just sitting quietly with deep breathing while intentionally relaxing all parts of your body for 5 minutes daily -if you need further help with stress, anxiety or depression please follow up with your primary doctor or contact the wonderful folks at [facility]: [phone]  SOCIAL CONNECTIONS: -options in [place] if you wish to engage in more social and exercise related activities:  -Silver sneakers https://tools.silversneakers.com  -Walk with a Doc: http://www.duncan-williams.com/  -Check out the [facility] Active Adults 50+ section on the [place] of [employer] (hiking clubs, book clubs, cards and games, chess, exercise classes, aquatic classes and much more) - see the website for details: https://www.[place].gov/departments/parks-recreation/active-adults50  -YouTube has lots of exercise videos for different ages and abilities as well  -[facility] (a variety of indoor and outdoor inperson activities for adults). ([phone]. [address].  -Virtual Online Classes (a variety of topics): see seniorplanet.org or call [phone])580-1573  -consider volunteering at a school, hospice center, church, senior center or elsewhere   ADVANCED HEALTHCARE DIRECTIVES:  Everyone should have advanced health care directives in place. This is so that you get the care you want, should you ever be in a situation where you are unable to make your own medical decisions.   From the [place] Advanced Directive Website: "Advance Health Care Directives are legal documents in which you give written instructions about your health care if, in the future, you cannot speak for  yourself.   A health care power of attorney allows you to name a person you trust to make your health care decisions if you cannot make them yourself. A declaration of a desire for a natural death (or living will) is document, which states that you desire not to have your life prolonged by extraordinary measures if you have a terminal or incurable illness or if you are in a vegetative state. An advance instruction for mental health treatment makes a declaration of instructions, information and preferences regarding your mental health treatment. It also states that you are aware that the advance instruction authorizes a mental health treatment provider to act according to your wishes. It may also outline your consent or refusal of mental health treatment. A declaration of an anatomical gift allows anyone over the age of 56 to make a gift by will, organ donor card or other document."   Please see the following website or an elder law attorney for forms, FAQs and for completion of advanced directives: [place] TEFL teacher Health Care Directives Advance Health Care Directives (http://guzman.com/)  Or copy and paste the following to your web browser: PoshChat.fi          Terressa Koyanagi, DO

## 2022-08-21 ENCOUNTER — Encounter: Payer: Self-pay | Admitting: Internal Medicine

## 2022-08-21 ENCOUNTER — Ambulatory Visit (INDEPENDENT_AMBULATORY_CARE_PROVIDER_SITE_OTHER): Payer: Medicare (Managed Care) | Admitting: Family Medicine

## 2022-08-21 ENCOUNTER — Other Ambulatory Visit: Payer: Self-pay

## 2022-08-21 ENCOUNTER — Ambulatory Visit (INDEPENDENT_AMBULATORY_CARE_PROVIDER_SITE_OTHER): Payer: Medicare (Managed Care) | Admitting: Internal Medicine

## 2022-08-21 ENCOUNTER — Encounter: Payer: Self-pay | Admitting: Family Medicine

## 2022-08-21 VITALS — BP 120/70 | HR 91 | Temp 97.9°F | Resp 16

## 2022-08-21 DIAGNOSIS — Z0000 Encounter for general adult medical examination without abnormal findings: Secondary | ICD-10-CM | POA: Diagnosis not present

## 2022-08-21 DIAGNOSIS — T8453XD Infection and inflammatory reaction due to internal right knee prosthesis, subsequent encounter: Secondary | ICD-10-CM | POA: Diagnosis not present

## 2022-08-21 NOTE — Assessment & Plan Note (Signed)
I told him again that the only way that his chronic MRSA prosthetic knee infection could be cured is removal of all of the old hardware.  I told him that I am in agreement with his plan and above-the-knee amputation.  He will continue doxycycline and follow-up here postoperatively.

## 2022-08-21 NOTE — Patient Instructions (Addendum)
I really enjoyed getting to talk with you today! I am available on Tuesdays and Thursdays for virtual visits if you have any questions or concerns, or if I can be of any further assistance.   CHECKLIST FROM ANNUAL WELLNESS VISIT:  -Follow up (please call to schedule if not scheduled after visit):   -yearly for annual wellness visit with primary care office  Here is a list of your preventive care/health maintenance measures and the plan for each if any are due:  PLAN For any measures below that may be due:  -if you change your mind or have about questions about ant of the vaccines or other measures please let your primary care doctor know.   Health Maintenance  Topic Date Due   COVID-19 Vaccine (1) 09/06/2022 (Originally 02/24/1956)   Zoster Vaccines- Shingrix (1 of 2) 11/20/2022 (Originally 08/26/2003)   Pneumonia Vaccine 29+ Years old (1 of 2 - PCV) 08/21/2023 (Originally 08/26/1961)   INFLUENZA VACCINE  11/27/2022   Medicare Annual Wellness (AWV)  08/21/2023   DTaP/Tdap/Td (2 - Td or Tdap) 12/06/2023   COLONOSCOPY (Pts 45-46yrs Insurance coverage will need to be confirmed)  02/06/2026   Hepatitis C Screening  Completed   HPV VACCINES  Aged Out    -See a dentist at least yearly  -Get your eyes checked and then per your eye specialist's recommendations  -Other issues addressed today:  -please consider cutting back or quitting alcohol, your current drinking level can cause significant harm to your health  -I have included below further information regarding a healthy whole foods based diet, physical activity guidelines for adults, stress management and opportunities for social connections. I hope you find this information useful.    -----------------------------------------------------------------------------------------------------------------------------------------------------------------------------------------------------------------------------------------------------------  NUTRITION: -eat real food: lots of colorful vegetables (half the plate) and fruits -5-7 servings of vegetables and fruits per day (fresh or steamed is best), exp. 2 servings of vegetables with lunch and dinner and 2 servings of fruit per day. Berries and greens such as kale and collards are great choices.  -consume on a regular basis: whole grains (make sure first ingredient on label contains the word "whole"), fresh fruits, fish, nuts, seeds, healthy oils (such as olive oil, avocado oil, grape seed oil) -may eat small amounts of dairy and lean meat on occasion, but avoid processed meats such as ham, bacon, lunch meat, etc. -drink water -try to avoid fast food and pre-packaged foods, processed meat -most experts advise limiting sodium to < 2300mg  per day, should limit further is any chronic conditions such as high blood pressure, heart disease, diabetes, etc. The American Heart Association advised that < 1500mg  is is ideal -try to avoid foods that contain any ingredients with names you do not recognize  -try to avoid sugar/sweets (except for the natural sugar that occurs in fresh fruit) -try to avoid sweet drinks -try to avoid white rice, white bread, pasta (unless whole grain), white or yellow potatoes  EXERCISE GUIDELINES FOR ADULTS: -if you wish to increase your physical activity, do so gradually and with the approval of your doctor -STOP and seek medical care immediately if you have any chest pain, chest discomfort or trouble breathing when starting or increasing exercise  -move and stretch your body, legs, feet and arms when sitting for long periods -Physical activity guidelines for optimal health in adults: -least 150 minutes per week of  aerobic exercise (can talk, but not sing) once approved by your doctor, 20-30 minutes of sustained activity or two 10 minute episodes  of sustained activity every day.  -resistance training at least 2 days per week if approved by your doctor -balance exercises 3+ days per week:   Stand somewhere where you have something sturdy to hold onto if you lose balance.    1) lift up on toes, start with 5x per day and work up to 20x   2) stand and lift on leg straight out to the side so that foot is a few inches of the floor, start with 5x each side and work up to 20x each side   3) stand on one foot, start with 5 seconds each side and work up to 20 seconds on each side  If you need ideas or help with getting more active:  -Silver sneakers https://tools.silversneakers.com  -Walk with a Doc: http://www.duncan-williams.com/  -try to include resistance (weight lifting/strength building) and balance exercises twice per week: or the following link for ideas: http://castillo-powell.com/  BuyDucts.dk  STRESS MANAGEMENT: -can try meditating, or just sitting quietly with deep breathing while intentionally relaxing all parts of your body for 5 minutes daily -if you need further help with stress, anxiety or depression please follow up with your primary doctor or contact the wonderful folks at [facility]: [phone]  SOCIAL CONNECTIONS: -options in [place] if you wish to engage in more social and exercise related activities:  -Silver sneakers https://tools.silversneakers.com  -Walk with a Doc: http://www.duncan-williams.com/  -Check out the [facility] Active Adults 50+ section on the [place] [employer] (hiking clubs, book clubs, cards and games, chess, exercise classes, aquatic classes and much more) - see the website for  details: https://www.[place].gov/departments/parks-recreation/active-adults50  -YouTube has lots of exercise videos for different ages and abilities as well  -[facility] (a variety of indoor and outdoor inperson activities for adults). [phone]. [address].  -Virtual Online Classes (a variety of topics): see seniorplanet.org or call 708-322-4346  -consider volunteering at a school, hospice center, church, senior center or elsewhere   ADVANCED HEALTHCARE DIRECTIVES:  Everyone should have advanced health care directives in place. This is so that you get the care you want, should you ever be in a situation where you are unable to make your own medical decisions.   From the [place] Advanced Directive Website: "Advance Health Care Directives are legal documents in which you give written instructions about your health care if, in the future, you cannot speak for yourself.   A health care power of attorney allows you to name a person you trust to make your health care decisions if you cannot make them yourself. A declaration of a desire for a natural death (or living will) is document, which states that you desire not to have your life prolonged by extraordinary measures if you have a terminal or incurable illness or if you are in a vegetative state. An advance instruction for mental health treatment makes a declaration of instructions, information and preferences regarding your mental health treatment. It also states that you are aware that the advance instruction authorizes a mental health treatment provider to act according to your wishes. It may also outline your consent or refusal of mental health treatment. A declaration of an anatomical gift allows anyone over the age of 43 to make a gift by will, organ donor card or other document."   Please see the following website or an elder law attorney for forms, FAQs and for completion of advanced directives: [place]  TEFL teacher Health Care Directives Advance Health Care Directives (  http://guzman.[email or web address]/)  Or copy and paste the following to your web browser: [email or web address]

## 2022-08-21 NOTE — Progress Notes (Signed)
[facility]  Patient Active Problem List   Diagnosis Date Noted   MRSA bacteremia 09/21/2018    Priority: High   Infection of prosthetic right knee joint 09/20/2018    Priority: High   Epidural abscess 09/20/2018    Priority: High   S/P right TKA 05/02/2013    Priority: High   Right knee pain 07/08/2022   Cerebrovascular disease 07/08/2022   Renal cyst 07/08/2022   Protein-calorie malnutrition, severe 07/08/2022   Toxic metabolic encephalopathy 02/26/2022   Septic joint of right knee joint 11/08/2020   S/P right TKA reimplantation 08/02/2020   Infection of total left knee replacement 03/20/2020   Cirrhosis of liver without ascites    AKI (acute kidney injury)    Bacteremia    Spinal stenosis of lumbar region without neurogenic claudication    Aspiration pneumonia 09/24/2018   Abscess in epidural space of lumbar spine    Anemia of chronic disease 09/20/2018   Hypoalbuminemia 09/20/2018   Hypoglycemia without diagnosis of diabetes mellitus 09/20/2018   Hyperkalemia 05/18/2018   Edentulous 05/11/2018   Pancytopenia 05/10/2018   CAD (coronary artery disease) 05/10/2018   Hepatic encephalopathy 05/10/2018   Alcohol abuse 05/10/2018   GERD (gastroesophageal reflux disease) 05/10/2018   Duodenal ulcer    Renal failure    Acute on chronic anemia    Hypotension 06/17/2017   Hyponatremia 06/17/2017   Leg edema, right 06/17/2017   Acute metabolic encephalopathy 06/17/2017   Anemia, iron deficiency    Benign neoplasm of ascending colon    Hemorrhoids    Portal hypertensive gastropathy    Gastritis and gastroduodenitis    Esophageal varices without bleeding    H/O: CVA (cerebrovascular accident) 12/01/2013   ACS (acute coronary syndrome) 12/01/2013   Anasarca 12/01/2013   Polysubstance abuse 12/01/2013   CKD (chronic kidney disease) stage 3, GFR 30-59 ml/min 12/01/2013   Murmur 04/14/2013   Right inguinal hernia 12/26/2010    Cirrhosis, alcoholic 12/20/2010    Patient's Medications  New Prescriptions   No medications on file  Previous Medications   ASPIRIN EC 81 MG TABLET    Take 81 mg by mouth 2 (two) times daily with a meal. Swallow whole.   DOXYCYCLINE (VIBRA-TABS) 100 MG TABLET    Take 1 tablet (100 mg total) by mouth 2 (two) times daily.   FERROUS SULFATE 325 (65 FE) MG TABLET    Take 1 tablet (325 mg total) by mouth 2 (two) times daily with a meal. Take for two weeks as tolerated.   FOLIC ACID (FOLVITE) 1 MG TABLET    Take 1 tablet (1 mg total) by mouth daily.   FUROSEMIDE (LASIX) 20 MG TABLET    TAKE 1 TABLET(20 MG) BY MOUTH TWICE DAILY   NADOLOL (CORGARD) 20 MG TABLET    TAKE 1 TABLET BY MOUTH EVERY DAY   THIAMINE (VITAMIN B-1) 100 MG TABLET    Take 1 tablet (100 mg total) by mouth daily.  Modified Medications   No medications on file  Discontinued Medications   No medications on file    Subjective: Mark [name] is in for his routine hospital follow-up visit.  He has had recurrent bacteremia.  He had group A strep bacteremia in February 2019, MSSA bacteremia in January 2020 and MRSA bacteremia in May 2020.  His last bacteremia was complicated by thoracic spine infection.  He was supposed to be taking chronic doxycycline ever since that hospitalization.  However,  he stopped taking it sometime late 2020.  He is not exactly sure when or why he stopped it.   He began having increased pain, swelling and "popping" in his prosthetic right knee last fall.  He thinks that this started after he quit doxycycline.  He cannot recall who replaced his knee about 8 years ago.  He saw Dr. Worthy Rancher who told him that the prosthesis was infected and loose.  He said that fluid was drained off of the knee.  It was reported the cultures grew MRSA susceptible to doxycycline and daptomycin.  He was admitted to the hospital in late November and underwent resection arthroplasty and spacer placement.  No operative cultures were obtained.  He has chronic renal insufficiency.  My partner discharged him on IV daptomycin and he completed 8 weeks of therapy on 05/02/2019 before switching back to chronic, suppressive oral doxycycline.    Unfortunately he stopped taking the doxycycline again.  His wife says that he was off about 3 weeks before he started having recurrent pain and swelling in his right prosthetic knee.  He was hospitalized again and underwent incision and drainage with polyexchange on 11/13/2020.  Cultures grew MRSA again which was still susceptible to doxycycline.  He was treated with IV daptomycin before switching back to chronic, suppressive doxycycline.  Unfortunately he was admitted to the hospital in early November.  An aspirate of his right prosthetic knee grew MRSA again which was still susceptible to doxycycline.  He did not undergo any surgery.  He was treated with daptomycin again and completed therapy on 04/09/2022 before transitioning back to doxycycline.  His knee is less swollen and less painful.  He did not have any problems tolerating his PICC or daptomycin.  He was admitted again on 07/07/2022 with confusion and the report from his family that he had been having increasing right knee pain.  Notes indicate that he was self-medicating with snorted heroin for his pain and that possibly he had been missing doses of his doxycycline.  He was evaluated by Dr. Lajoyce Corners who did not feel he had any significant effusion and no intervention was performed.  Dr. Charlann Boxer did discuss management options with him.    After his last visit with me several weeks ago he did meet with Dr. Aldean Baker and has agreed to above-the-knee amputation which is planned for 09/07/2022.  He denies missing any doses of his doxycycline.  Review of Systems: Review of Systems  Constitutional:  Positive for malaise/fatigue and weight loss. Negative for chills and diaphoresis.  Gastrointestinal:  Negative for abdominal pain, diarrhea, nausea and vomiting.   Musculoskeletal:  Positive for back pain and joint pain.    Past Medical History:  Diagnosis Date   Alcohol abuse    Anemia    Arthritis    Ascites    Cirrhosis    Coffee ground emesis    Dehydration 06/17/2017   Febrile illness    GERD (gastroesophageal reflux disease)    Heart murmur    History of blood transfusion    Hyperlipidemia    Hypertension    Leg swelling    Myocardial infarction 2012   Preop cardiovascular exam 04/14/2013   Sepsis 06/17/2017   Septic shock 06/18/2017   SIRS (systemic inflammatory response syndrome) 07/11/2017   Stroke 10/2009   TIA   Thrombocytopenia     Social History   Tobacco Use   Smoking status: Former    Packs/day: 0.50    Years: 10.00  Additional pack years: 0.00    Total pack years: 5.00    Types: Cigarettes    Quit date: 01/05/2016    Years since quitting: 6.6   Smokeless tobacco: Never   Tobacco comments:    Pt reports he quit smoke 3 months ago. 07/11/22-km,cma  Vaping Use   Vaping Use: Never used  Substance Use Topics   Alcohol use: Yes    Comment: 2-3 beers/day   Drug use: Yes    Types: Marijuana    Family History  Problem Relation Age of Onset   Hypertension Father    Diabetes Father    Dementia Mother    Lupus Sister     No Known Allergies  Objective: Vitals:   08/21/22 1115  BP: 120/70  Pulse: 91  Resp: 16  Temp: 97.9 F (36.6 C)  TempSrc: Oral  SpO2: 99%   There is no height or weight on file to calculate BMI.  Physical Exam Constitutional:      Comments: He is very pleasant today.  He has lots of questions for me about surgery.  Cardiovascular:     Rate and Rhythm: Normal rate.  Pulmonary:     Effort: Pulmonary effort is normal.  Musculoskeletal:     Comments: His right knee incision has healed nicely.  He has diffuse swelling of his knee.  His knee is warm to the touch.    Psychiatric:        Mood and Affect: Mood normal.     Lab Results Sed Rate  Date Value  07/07/2022 130  mm/hr (H)  04/16/2022 >130 mm/h (H)  11/08/2020 >140 mm/hr (H)   CRP  Date Value  07/07/2022 3.7 mg/dL (H)  16/01/9603 54.0 mg/L (H)  11/08/2020 3.6 mg/dL (H)      Problem List Items Addressed This Visit       High   Infection of prosthetic right knee joint - Primary    I told him again that the only way that his chronic MRSA prosthetic knee infection could be cured is removal of all of the old hardware.  I told him that I am in agreement with his plan and above-the-knee amputation.  He will continue doxycycline and follow-up here postoperatively.     Cliffton Asters, MD [facility] [phone] pager   [phone] cell 08/21/2022, 12:08 PM

## 2022-08-25 ENCOUNTER — Ambulatory Visit: Payer: Self-pay

## 2022-08-25 NOTE — Patient Instructions (Signed)
Visit Information  Thank you for taking time to visit with me today. Please don't hesitate to contact me if I can be of assistance to you.   Following are the goals we discussed today:   Goals Addressed             This Visit's Progress    "Get Right Knee Better"       Patient Goals/Self Care Activities: -Patient/Caregiver will self-administer medications as prescribed as evidenced by self-report/primary caregiver report  -Patient/Caregiver will attend all scheduled provider appointments as evidenced by clinician review of documented attendance to scheduled appointments and patient/caregiver report -Patient/Caregiver will call provider office for new concerns or questions as evidenced by review of documented incoming telephone call notes and patient report  Discussed signs of infection to knee and when to call doctor Patient seen by  infectious disease.  Patient to possibly have right AKA.  Waiting to hear from Dr. Lajoyce Corners.  Interventions Today    Flowsheet Row Most Recent Value  Chronic Disease   Chronic disease during today's visit Other  [right knee infection]  General Interventions   General Interventions Discussed/Reviewed General Interventions Reviewed  Education Interventions   Education Provided Provided Education  Provided Verbal Education On When to see the doctor  [Discussed signs of infection. Patient currently waiting for response from Dr. Lajoyce Corners concerning amputation of right leg above the knee.]             Our next appointment is by telephone on 09/08/22 at 1000 am  Please call the care guide team at [phone] if you need to cancel or reschedule your appointment.   If you are experiencing a Mental Health or Behavioral Health Crisis or need someone to talk to, please call the Suicide and Crisis Lifeline: 988   Patient verbalizes understanding of instructions and care plan provided today and agrees to view in MyChart. Active MyChart status and patient  understanding of how to access instructions and care plan via MyChart confirmed with patient.     The patient has been provided with contact information for the care management team and has been advised to call with any health related questions or concerns.   Bary Leriche, RN, MSN [facility] Care Management Care Management Coordinator Direct Line [phone]

## 2022-08-25 NOTE — Patient Outreach (Signed)
  Care Coordination   Follow Up Visit Note   08/25/2022 Name: Mark [name]. MRN: [id number] DOB: 22-Nov-1955  Mark [name]. is a 67 y.o. year old male who sees Deeann Saint, MD for primary care. I spoke with  Mark [name]. by phone today.  What matters to the patients health and wellness today?  Possible amputation    Goals Addressed             This Visit's Progress    "Get Right Knee Better"       Patient Goals/Self Care Activities: -Patient/Caregiver will self-administer medications as prescribed as evidenced by self-report/primary caregiver report  -Patient/Caregiver will attend all scheduled provider appointments as evidenced by clinician review of documented attendance to scheduled appointments and patient/caregiver report -Patient/Caregiver will call provider office for new concerns or questions as evidenced by review of documented incoming telephone call notes and patient report  Discussed signs of infection to knee and when to call doctor Patient seen by  infectious disease.  Patient to possibly have right AKA.  Waiting to hear from Dr. Lajoyce Corners.  Interventions Today    Flowsheet Row Most Recent Value  Chronic Disease   Chronic disease during today's visit Other  [right knee infection]  General Interventions   General Interventions Discussed/Reviewed General Interventions Reviewed  Education Interventions   Education Provided Provided Education  Provided Verbal Education On When to see the doctor  [Discussed signs of infection. Patient currently waiting for response from Dr. Lajoyce Corners concerning amputation of right leg above the knee.]             SDOH assessments and interventions completed:  Yes     Care Coordination Interventions:  Yes, provided   Follow up plan: Follow up call scheduled for May.     Encounter Outcome:  Pt. Visit Completed

## 2022-08-27 DIAGNOSIS — Z96651 Presence of right artificial knee joint: Secondary | ICD-10-CM | POA: Diagnosis not present

## 2022-08-27 DIAGNOSIS — T8484XA Pain due to internal orthopedic prosthetic devices, implants and grafts, initial encounter: Secondary | ICD-10-CM | POA: Diagnosis not present

## 2022-09-08 ENCOUNTER — Ambulatory Visit: Payer: Self-pay

## 2022-09-08 NOTE — Patient Outreach (Signed)
  Care Coordination   Follow Up Visit Note   09/08/2022 Name: Mark [name]. MRN: [id number] DOB: 11/25/1955  Mark [name]. is a 67 y.o. year old male who sees Deeann Saint, MD for primary care. I spoke with  Mark [name]. by phone today.  What matters to the patients health and wellness today?  Possible right amputation    Goals Addressed             This Visit's Progress    "Get Right Knee Better"       Patient Goals/Self Care Activities: -Patient/Caregiver will self-administer medications as prescribed as evidenced by self-report/primary caregiver report  -Patient/Caregiver will attend all scheduled provider appointments as evidenced by clinician review of documented attendance to scheduled appointments and patient/caregiver report -Patient/Caregiver will call provider office for new concerns or questions as evidenced by review of documented incoming telephone call notes and patient report  Discussed signs of infection to knee and when to call doctor Patient seen by  infectious disease.  Patient to call Dr. Lajoyce Corners to proceed with amputation plans.           SDOH assessments and interventions completed:  Yes     Care Coordination Interventions:  Yes, provided   Follow up plan: Follow up call scheduled for june    Encounter Outcome:  Pt. Visit Completed   Bary Leriche, RN, MSN [facility] Care Management Care Management Coordinator Direct Line [phone]

## 2022-09-08 NOTE — Patient Instructions (Signed)
Visit Information  Thank you for taking time to visit with me today. Please don't hesitate to contact me if I can be of assistance to you.   Following are the goals we discussed today:   Goals Addressed             This Visit's Progress    "Get Right Knee Better"       Patient Goals/Self Care Activities: -Patient/Caregiver will self-administer medications as prescribed as evidenced by self-report/primary caregiver report  -Patient/Caregiver will attend all scheduled provider appointments as evidenced by clinician review of documented attendance to scheduled appointments and patient/caregiver report -Patient/Caregiver will call provider office for new concerns or questions as evidenced by review of documented incoming telephone call notes and patient report  Discussed signs of infection to knee and when to call doctor Patient seen by  infectious disease.  Patient to call Dr. Lajoyce Corners to proceed with amputation plans.           Our next appointment is by telephone on 10/07/22 at 1000  Please call the care guide team at [phone] if you need to cancel or reschedule your appointment.   If you are experiencing a Mental Health or Behavioral Health Crisis or need someone to talk to, please call the Suicide and Crisis Lifeline: 988   The patient verbalized understanding of instructions, educational materials, and care plan provided today and DECLINED offer to receive copy of patient instructions, educational materials, and care plan.   The patient has been provided with contact information for the care management team and has been advised to call with any health related questions or concerns.   Bary Leriche, RN, MSN [facility] Care Management Care Management Coordinator Direct Line [phone]

## 2022-09-12 ENCOUNTER — Telehealth: Payer: Self-pay | Admitting: Orthopedic Surgery

## 2022-09-12 NOTE — Telephone Encounter (Signed)
Patient wants to speak to Dr. Lajoyce Corners before he schedules surgery

## 2022-09-12 NOTE — Telephone Encounter (Signed)
[email or web address]

## 2022-09-18 NOTE — Telephone Encounter (Signed)
[email or web address]

## 2022-09-23 NOTE — Telephone Encounter (Signed)
Pt has not returned my calls back. Will close out note.

## 2022-09-30 ENCOUNTER — Ambulatory Visit: Payer: Medicare HMO | Admitting: Internal Medicine

## 2022-10-07 ENCOUNTER — Ambulatory Visit: Payer: Self-pay

## 2022-10-07 NOTE — Patient Instructions (Signed)
Visit Information  Thank you for taking time to visit with me today. Please don't hesitate to contact me if I can be of assistance to you.   Following are the goals we discussed today:   Goals Addressed             This Visit's Progress    "Get Right Knee Better"       Patient Goals/Self Care Activities: -Patient/Caregiver will self-administer medications as prescribed as evidenced by self-report/primary caregiver report  -Patient/Caregiver will attend all scheduled provider appointments as evidenced by clinician review of documented attendance to scheduled appointments and patient/caregiver report -Patient/Caregiver will call provider office for new concerns or questions as evidenced by review of documented incoming telephone call notes and patient report  Discussed signs of infection to knee and when to call doctor Patient seen by  infectious disease.  Patient continues on anitbiotics.  Patient to call Dr. Lajoyce Corners.           Our next appointment is by telephone on 11/04/22 at 1000 am  Please call the care guide team at [phone] if you need to cancel or reschedule your appointment.   If you are experiencing a Mental Health or Behavioral Health Crisis or need someone to talk to, please call the Suicide and Crisis Lifeline: 988   The patient verbalized understanding of instructions, educational materials, and care plan provided today and DECLINED offer to receive copy of patient instructions, educational materials, and care plan.   The patient has been provided with contact information for the care management team and has been advised to call with any health related questions or concerns.   Bary Leriche, RN, MSN [facility] Care Management Care Management Coordinator Direct Line [phone]

## 2022-10-07 NOTE — Patient Outreach (Signed)
  Care Coordination   Follow Up Visit Note   10/07/2022 Name: Mark [name]. MRN: [id number] DOB: 03-03-1956  Mark [name]. is a 67 y.o. year old male who sees Mark Saint, MD for primary care. I spoke with  Mark [name]. by phone today. Patient has not been scheduled for amputation. Patient was to call office back but never did.  Patient states he will call office.  Advised patient on how antibiotic he is taking is helping the infection from spreading. He verbalized understanding.     What matters to the patients health and wellness today?  Getting amputation done    Goals Addressed             This Visit's Progress    "Get Right Knee Better"       Patient Goals/Self Care Activities: -Patient/Caregiver will self-administer medications as prescribed as evidenced by self-report/primary caregiver report  -Patient/Caregiver will attend all scheduled provider appointments as evidenced by clinician review of documented attendance to scheduled appointments and patient/caregiver report -Patient/Caregiver will call provider office for new concerns or questions as evidenced by review of documented incoming telephone call notes and patient report  Discussed signs of infection to knee and when to call doctor Patient seen by  infectious disease.  Patient continues on anitbiotics.  Patient to call Dr. Lajoyce Corners.           SDOH assessments and interventions completed:  Yes     Care Coordination Interventions:  Yes, provided   Follow up plan: Follow up call scheduled for July    Encounter Outcome:  Pt. Visit Completed   Bary Leriche, RN, MSN [facility] Care Management Care Management Coordinator Direct Line [phone]

## 2022-11-03 ENCOUNTER — Other Ambulatory Visit: Payer: Self-pay | Admitting: Family Medicine

## 2022-11-04 ENCOUNTER — Ambulatory Visit: Payer: Self-pay

## 2022-11-04 NOTE — Patient Outreach (Signed)
  Care Coordination   Follow Up Visit Note   11/04/2022 Name: Jurell Knicely. MRN: [id number] DOB: 02/14/1956  [name]. is a 66 y.o. year old male who sees Deeann Saint, MD for primary care. I spoke with  [name]. by phone today.  What matters to the patients health and wellness today?  Right knee amputation    Goals Addressed             This Visit's Progress    "Get Right Knee Better"       Patient Goals/Self Care Activities: -Patient/Caregiver will self-administer medications as prescribed as evidenced by self-report/primary caregiver report  -Patient/Caregiver will attend all scheduled provider appointments as evidenced by clinician review of documented attendance to scheduled appointments and patient/caregiver report -Patient/Caregiver will call provider office for new concerns or questions as evidenced by review of documented incoming telephone call notes and patient report   Patient scheduled for knee surgery on Friday.  Encouraged patient to ask questions prior to surgery. He verbalized understanding.  Patient remains on antibiotic at this time.        SDOH assessments and interventions completed:  Yes     Care Coordination Interventions:  Yes, provided   Follow up plan: Follow up call scheduled for August    Encounter Outcome:  Pt. Visit Completed   Bary Leriche, RN, MSN [facility] Care Management Care Management Coordinator Direct Line [phone]

## 2022-11-04 NOTE — Patient Instructions (Signed)
Visit Information  Thank you for taking time to visit with me today. Please don't hesitate to contact me if I can be of assistance to you.   Following are the goals we discussed today:   Goals Addressed             This Visit's Progress    "Get Right Knee Better"       Patient Goals/Self Care Activities: -Patient/Caregiver will self-administer medications as prescribed as evidenced by self-report/primary caregiver report  -Patient/Caregiver will attend all scheduled provider appointments as evidenced by clinician review of documented attendance to scheduled appointments and patient/caregiver report -Patient/Caregiver will call provider office for new concerns or questions as evidenced by review of documented incoming telephone call notes and patient report   Patient scheduled for knee surgery on Friday.  Encouraged patient to ask questions prior to surgery. He verbalized understanding.  Patient remains on antibiotic at this time.        Our next appointment is by telephone on 12/02/22 at 1030 am  Please call the care guide team at [phone] if you need to cancel or reschedule your appointment.   If you are experiencing a Mental Health or Behavioral Health Crisis or need someone to talk to, please call the Suicide and Crisis Lifeline: 988   The patient verbalized understanding of instructions, educational materials, and care plan provided today and DECLINED offer to receive copy of patient instructions, educational materials, and care plan.   The patient has been provided with contact information for the care management team and has been advised to call with any health related questions or concerns.   Bary Leriche, RN, MSN [facility] Care Management Care Management Coordinator Direct Line [phone]

## 2022-11-06 ENCOUNTER — Encounter (HOSPITAL_COMMUNITY): Payer: Self-pay | Admitting: Orthopedic Surgery

## 2022-11-06 ENCOUNTER — Other Ambulatory Visit: Payer: Self-pay

## 2022-11-06 NOTE — Anesthesia Preprocedure Evaluation (Addendum)
Anesthesia Evaluation  Patient identified by MRN, date of birth, ID band Patient awake    Reviewed: Allergy & Precautions, NPO status , Patient's Chart, lab work & pertinent test results  History of Anesthesia Complications Negative for: history of anesthetic complications  Airway Mallampati: II  TM Distance: >3 FB Neck ROM: Full    Dental  (+) Edentulous Upper, Edentulous Lower   Pulmonary pneumonia, former smoker   Pulmonary exam normal breath sounds clear to auscultation       Cardiovascular hypertension, Pt. on medications + CAD and + Past MI  Normal cardiovascular exam+ dysrhythmias Supra Ventricular Tachycardia + Valvular Problems/Murmurs  Rhythm:Regular Rate:Normal  Echo   1. The left ventricle has normal systolic function with an ejection fraction of 60-65%. The cavity size was normal. There is mildly increased left ventricular wall thickness. Left ventricular diastolic parameters were normal.   2. The right ventricle has normal systolic function. The cavity was normal. There is no increase in right ventricular wall thickness.   3. Left atrial size was severely dilated.   4. No evidence of mitral valve stenosis.   5. Mild thickening of the aortic valve. Mild calcification of the aortic valve. No stenosis of the aortic valve.    Echo 09/22/2018 IMPRESSIONS  1. The left ventricle has normal systolic function with an ejection fraction of 60-65%. The cavity size was normal. There is mildly increased left ventricular wall thickness. Left ventricular diastolic parameters were normal.  2. The right ventricle has normal systolic function. The cavity was normal. There is no increase in right ventricular wall thickness.  3. Left atrial size was severely dilated.  4. The mitral valve is normal in structure. Mitral valve regurgitation is mild by color flow Doppler. No evidence of mitral valve stenosis. 5. Mild thickening of the  aortic valve. Mild calcification of the aortic valve. No stenosis of the aortic valve.    Neuro/Psych TIACVA    GI/Hepatic PUD,GERD  ,,(+) Cirrhosis   ascites  substance abuse  alcohol use and marijuana use  Endo/Other  negative endocrine ROS    Renal/GU CRFRenal disease     Musculoskeletal  (+) Arthritis ,    Abdominal   Peds  Hematology  (+) Blood dyscrasia, anemia Hgb 8.2, plts 153   Anesthesia Other Findings  Chronic infected TKA  Reproductive/Obstetrics                             Anesthesia Physical Anesthesia Plan  ASA: 3  Anesthesia Plan: General   Post-op Pain Management: GA combined w/ Regional for post-op pain and Regional block*   Induction: Intravenous  PONV Risk Score and Plan: 2 and Ondansetron, Dexamethasone and Treatment may vary due to age or medical condition  Airway Management Planned: LMA  Additional Equipment: None  Intra-op Plan:   Post-operative Plan: Extubation in OR  Informed Consent: I have reviewed the patients History and Physical, chart, labs and discussed the procedure including the risks, benefits and alternatives for the proposed anesthesia with the patient or authorized representative who has indicated his/her understanding and acceptance.     Dental advisory given  Plan Discussed with: CRNA  Anesthesia Plan Comments: (PAT note written 02/01/2021 by Shonna Chock, PA-C. )        Anesthesia Quick Evaluation

## 2022-11-06 NOTE — Progress Notes (Signed)
Left voicemail for patient with new arrival time of 1000 tomorrow.  Daughter stated she will tell her dad about new arrival time of 1000

## 2022-11-06 NOTE — Progress Notes (Signed)
Spoke with pt for pre-op call. Pt states he had a "heart attack" years ago. States he was told it was a mild one, did not have stents placed. Pt denies any recent chest pain. Has hx of a heart murmur, Echo done 09/22/18. PCP is Dr. Abbe Amsterdam. Pt wanted to speak with Dr. Lajoyce Corners prior to surgery. I gave pt his office number.  Shower instructions given to pt.

## 2022-11-07 ENCOUNTER — Encounter (HOSPITAL_COMMUNITY)
Admission: RE | Disposition: A | Discharge status: Home Health | Payer: Self-pay | Source: Home / Self Care | Attending: Orthopedic Surgery

## 2022-11-07 ENCOUNTER — Encounter (HOSPITAL_COMMUNITY): Payer: Self-pay | Admitting: Orthopedic Surgery

## 2022-11-07 ENCOUNTER — Other Ambulatory Visit: Payer: Self-pay

## 2022-11-07 ENCOUNTER — Inpatient Hospital Stay (HOSPITAL_COMMUNITY): Payer: Medicare HMO | Admitting: Anesthesiology

## 2022-11-07 ENCOUNTER — Inpatient Hospital Stay (HOSPITAL_COMMUNITY)
Admission: RE | Admit: 2022-11-07 | Discharge: 2022-11-11 | DRG: 464 | Disposition: A | Discharge status: Home Health | Payer: Medicare HMO | Attending: Orthopedic Surgery | Admitting: Orthopedic Surgery

## 2022-11-07 DIAGNOSIS — E785 Hyperlipidemia, unspecified: Secondary | ICD-10-CM | POA: Diagnosis present

## 2022-11-07 DIAGNOSIS — Z96659 Presence of unspecified artificial knee joint: Secondary | ICD-10-CM

## 2022-11-07 DIAGNOSIS — Y831 Surgical operation with implant of artificial internal device as the cause of abnormal reaction of the patient, or of later complication, without mention of misadventure at the time of the procedure: Secondary | ICD-10-CM | POA: Diagnosis present

## 2022-11-07 DIAGNOSIS — K7460 Unspecified cirrhosis of liver: Secondary | ICD-10-CM | POA: Diagnosis present

## 2022-11-07 DIAGNOSIS — T8453XA Infection and inflammatory reaction due to internal right knee prosthesis, initial encounter: Principal | ICD-10-CM | POA: Diagnosis present

## 2022-11-07 DIAGNOSIS — T8459XA Infection and inflammatory reaction due to other internal joint prosthesis, initial encounter: Secondary | ICD-10-CM | POA: Diagnosis not present

## 2022-11-07 DIAGNOSIS — I2510 Atherosclerotic heart disease of native coronary artery without angina pectoris: Secondary | ICD-10-CM

## 2022-11-07 DIAGNOSIS — G479 Sleep disorder, unspecified: Secondary | ICD-10-CM | POA: Diagnosis present

## 2022-11-07 DIAGNOSIS — K219 Gastro-esophageal reflux disease without esophagitis: Secondary | ICD-10-CM | POA: Diagnosis not present

## 2022-11-07 DIAGNOSIS — N183 Chronic kidney disease, stage 3 unspecified: Secondary | ICD-10-CM | POA: Diagnosis not present

## 2022-11-07 DIAGNOSIS — M00061 Staphylococcal arthritis, right knee: Secondary | ICD-10-CM | POA: Diagnosis present

## 2022-11-07 DIAGNOSIS — Z833 Family history of diabetes mellitus: Secondary | ICD-10-CM | POA: Diagnosis not present

## 2022-11-07 DIAGNOSIS — Z66 Do not resuscitate: Secondary | ICD-10-CM | POA: Diagnosis not present

## 2022-11-07 DIAGNOSIS — Z8269 Family history of other diseases of the musculoskeletal system and connective tissue: Secondary | ICD-10-CM | POA: Diagnosis not present

## 2022-11-07 DIAGNOSIS — Z01818 Encounter for other preprocedural examination: Principal | ICD-10-CM

## 2022-11-07 DIAGNOSIS — I129 Hypertensive chronic kidney disease with stage 1 through stage 4 chronic kidney disease, or unspecified chronic kidney disease: Secondary | ICD-10-CM

## 2022-11-07 DIAGNOSIS — Z8673 Personal history of transient ischemic attack (TIA), and cerebral infarction without residual deficits: Secondary | ICD-10-CM | POA: Diagnosis not present

## 2022-11-07 DIAGNOSIS — M009 Pyogenic arthritis, unspecified: Secondary | ICD-10-CM | POA: Diagnosis not present

## 2022-11-07 DIAGNOSIS — Z87891 Personal history of nicotine dependence: Secondary | ICD-10-CM

## 2022-11-07 DIAGNOSIS — I252 Old myocardial infarction: Secondary | ICD-10-CM | POA: Diagnosis not present

## 2022-11-07 DIAGNOSIS — Z8249 Family history of ischemic heart disease and other diseases of the circulatory system: Secondary | ICD-10-CM

## 2022-11-07 DIAGNOSIS — Z96651 Presence of right artificial knee joint: Secondary | ICD-10-CM | POA: Diagnosis not present

## 2022-11-07 DIAGNOSIS — I10 Essential (primary) hypertension: Secondary | ICD-10-CM | POA: Diagnosis present

## 2022-11-07 DIAGNOSIS — Z89611 Acquired absence of right leg above knee: Secondary | ICD-10-CM

## 2022-11-07 HISTORY — PX: AMPUTATION: SHX166

## 2022-11-07 LAB — COMPREHENSIVE METABOLIC PANEL
ALT: 11 U/L (ref 0–44)
AST: 26 U/L (ref 15–41)
Albumin: 3 g/dL — ABNORMAL LOW (ref 3.5–5.0)
Alkaline Phosphatase: 107 U/L (ref 38–126)
Anion gap: 19 — ABNORMAL HIGH (ref 5–15)
BUN: 44 mg/dL — ABNORMAL HIGH (ref 8–23)
CO2: 19 mmol/L — ABNORMAL LOW (ref 22–32)
Calcium: 8.8 mg/dL — ABNORMAL LOW (ref 8.9–10.3)
Chloride: 98 mmol/L (ref 98–111)
Creatinine, Ser: 1.85 mg/dL — ABNORMAL HIGH (ref 0.61–1.24)
GFR, Estimated: 39 mL/min — ABNORMAL LOW (ref >60)
Glucose, Bld: 83 mg/dL (ref 70–99)
Potassium: 4.5 mmol/L (ref 3.5–5.1)
Sodium: 136 mmol/L (ref 135–145)
Total Bilirubin: 0.7 mg/dL (ref 0.3–1.2)
Total Protein: 7.7 g/dL (ref 6.5–8.1)

## 2022-11-07 LAB — CBC WITH DIFFERENTIAL/PLATELET
Abs Immature Granulocytes: 0 10*3/uL (ref 0.00–0.07)
Basophils Absolute: 0 10*3/uL (ref 0.0–0.1)
Basophils Relative: 1 %
Eosinophils Absolute: 0.5 10*3/uL (ref 0.0–0.5)
Eosinophils Relative: 13 %
HCT: 27.8 % — ABNORMAL LOW (ref 39.0–52.0)
Hemoglobin: 9 g/dL — ABNORMAL LOW (ref 13.0–17.0)
Immature Granulocytes: 0 %
Lymphocytes Relative: 35 %
Lymphs Abs: 1.2 10*3/uL (ref 0.7–4.0)
MCH: 32 pg (ref 26.0–34.0)
MCHC: 32.4 g/dL (ref 30.0–36.0)
MCV: 98.9 fL (ref 80.0–100.0)
Monocytes Absolute: 0.5 10*3/uL (ref 0.1–1.0)
Monocytes Relative: 13 %
Neutro Abs: 1.3 10*3/uL — ABNORMAL LOW (ref 1.7–7.7)
Neutrophils Relative %: 38 %
Platelets: 167 10*3/uL (ref 150–400)
RBC: 2.81 MIL/uL — ABNORMAL LOW (ref 4.22–5.81)
RDW: 14.3 % (ref 11.5–15.5)
WBC: 3.4 10*3/uL — ABNORMAL LOW (ref 4.0–10.5)
nRBC: 0 % (ref 0.0–0.2)

## 2022-11-07 LAB — SURGICAL PCR SCREEN
MRSA, PCR: NEGATIVE
Staphylococcus aureus: NEGATIVE

## 2022-11-07 LAB — PREALBUMIN: Prealbumin: 12 mg/dL — ABNORMAL LOW (ref 18–38)

## 2022-11-07 SURGERY — AMPUTATION, ABOVE KNEE
Anesthesia: Regional | Site: Knee | Laterality: Right

## 2022-11-07 MED ORDER — MAGNESIUM SULFATE 2 GM/50ML IV SOLN: 2.0000 g | Freq: Every day | INTRAVENOUS | Status: DC | PRN

## 2022-11-07 MED ORDER — 0.9 % SODIUM CHLORIDE (POUR BTL) OPTIME
TOPICAL | Status: DC | PRN
  Administered 2022-11-07: 1000 mL

## 2022-11-07 MED ORDER — HYDRALAZINE HCL 20 MG/ML IJ SOLN: 5.0000 mg | INTRAMUSCULAR | Status: DC | PRN

## 2022-11-07 MED ORDER — HYDROMORPHONE HCL 1 MG/ML IJ SOLN
0.5000 mg | INTRAMUSCULAR | Status: DC | PRN
  Administered 2022-11-08 – 2022-11-11 (×7): 1 mg via INTRAVENOUS
  Filled 2022-11-07 (×7): qty 1

## 2022-11-07 MED ORDER — GUAIFENESIN-DM 100-10 MG/5ML PO SYRP: 15.0000 mL | ORAL_SOLUTION | ORAL | Status: DC | PRN

## 2022-11-07 MED ORDER — PHENYLEPHRINE HCL (PRESSORS) 10 MG/ML IV SOLN
INTRAVENOUS | Status: DC | PRN
  Administered 2022-11-07: 80 ug via INTRAVENOUS

## 2022-11-07 MED ORDER — FENTANYL CITRATE (PF) 100 MCG/2ML IJ SOLN: 100.0000 ug | Freq: Once | INTRAMUSCULAR | Status: AC

## 2022-11-07 MED ORDER — CEFAZOLIN SODIUM-DEXTROSE 2-4 GM/100ML-% IV SOLN
2.0000 g | Freq: Three times a day (TID) | INTRAVENOUS | Status: AC
  Administered 2022-11-07 – 2022-11-08 (×2): 2 g via INTRAVENOUS
  Filled 2022-11-07 (×2): qty 100

## 2022-11-07 MED ORDER — MAGNESIUM CITRATE PO SOLN: 1.0000 | Freq: Once | ORAL | Status: DC | PRN

## 2022-11-07 MED ORDER — PROPOFOL 10 MG/ML IV BOLUS
INTRAVENOUS | Status: DC | PRN
  Administered 2022-11-07: 100 mg via INTRAVENOUS

## 2022-11-07 MED ORDER — POLYETHYLENE GLYCOL 3350 17 G PO PACK
17.0000 g | PACK | Freq: Every day | ORAL | Status: DC | PRN
  Filled 2022-11-07: qty 1

## 2022-11-07 MED ORDER — PHENOL 1.4 % MT LIQD: 1.0000 | OROMUCOSAL | Status: DC | PRN

## 2022-11-07 MED ORDER — CEFAZOLIN SODIUM-DEXTROSE 2-4 GM/100ML-% IV SOLN
2.0000 g | INTRAVENOUS | Status: AC
  Administered 2022-11-07: 2 g via INTRAVENOUS
  Filled 2022-11-07: qty 100

## 2022-11-07 MED ORDER — PANTOPRAZOLE SODIUM 40 MG PO TBEC
40.0000 mg | DELAYED_RELEASE_TABLET | Freq: Every day | ORAL | Status: DC
  Administered 2022-11-07 – 2022-11-11 (×5): 40 mg via ORAL
  Filled 2022-11-07 (×5): qty 1

## 2022-11-07 MED ORDER — ONDANSETRON HCL 4 MG/2ML IJ SOLN: 4.0000 mg | Freq: Four times a day (QID) | INTRAMUSCULAR | Status: DC | PRN

## 2022-11-07 MED ORDER — LABETALOL HCL 5 MG/ML IV SOLN
10.0000 mg | INTRAVENOUS | Status: DC | PRN
  Administered 2022-11-11: 10 mg via INTRAVENOUS
  Filled 2022-11-07: qty 4

## 2022-11-07 MED ORDER — DOCUSATE SODIUM 100 MG PO CAPS
100.0000 mg | ORAL_CAPSULE | Freq: Every day | ORAL | Status: DC
  Administered 2022-11-08 – 2022-11-09 (×2): 100 mg via ORAL
  Filled 2022-11-07 (×3): qty 1

## 2022-11-07 MED ORDER — POTASSIUM CHLORIDE CRYS ER 20 MEQ PO TBCR: 20.0000 meq | EXTENDED_RELEASE_TABLET | Freq: Every day | ORAL | Status: DC | PRN

## 2022-11-07 MED ORDER — JUVEN PO PACK
1.0000 | PACK | Freq: Two times a day (BID) | ORAL | Status: DC
  Administered 2022-11-08 – 2022-11-11 (×7): 1 via ORAL
  Filled 2022-11-07 (×7): qty 1

## 2022-11-07 MED ORDER — SODIUM CHLORIDE 0.9 % IV SOLN: INTRAVENOUS | Status: DC

## 2022-11-07 MED ORDER — CHLORHEXIDINE GLUCONATE 0.12 % MT SOLN
15.0000 mL | Freq: Once | OROMUCOSAL | Status: AC
  Administered 2022-11-07: 15 mL via OROMUCOSAL
  Filled 2022-11-07: qty 15

## 2022-11-07 MED ORDER — OXYCODONE HCL 5 MG PO TABS
5.0000 mg | ORAL_TABLET | ORAL | Status: DC | PRN
  Filled 2022-11-07: qty 2
  Filled 2022-11-07: qty 1

## 2022-11-07 MED ORDER — BISACODYL 5 MG PO TBEC: 5.0000 mg | DELAYED_RELEASE_TABLET | Freq: Every day | ORAL | Status: DC | PRN

## 2022-11-07 MED ORDER — ORAL CARE MOUTH RINSE: 15.0000 mL | Freq: Once | OROMUCOSAL | Status: AC

## 2022-11-07 MED ORDER — DEXAMETHASONE SODIUM PHOSPHATE 10 MG/ML IJ SOLN
INTRAMUSCULAR | Status: DC | PRN
  Administered 2022-11-07: 5 mg via INTRAVENOUS

## 2022-11-07 MED ORDER — LACTATED RINGERS IV SOLN: Freq: Once | INTRAVENOUS | Status: AC

## 2022-11-07 MED ORDER — METOPROLOL TARTRATE 5 MG/5ML IV SOLN: 2.0000 mg | INTRAVENOUS | Status: DC | PRN

## 2022-11-07 MED ORDER — ALUM & MAG HYDROXIDE-SIMETH 200-200-20 MG/5ML PO SUSP: 15.0000 mL | ORAL | Status: DC | PRN

## 2022-11-07 MED ORDER — FENTANYL CITRATE (PF) 250 MCG/5ML IJ SOLN
INTRAMUSCULAR | Status: DC | PRN
  Administered 2022-11-07: 50 ug via INTRAVENOUS
  Administered 2022-11-07 (×2): 25 ug via INTRAVENOUS

## 2022-11-07 MED ORDER — FENTANYL CITRATE (PF) 250 MCG/5ML IJ SOLN
INTRAMUSCULAR | Status: AC
  Filled 2022-11-07: qty 5

## 2022-11-07 MED ORDER — LIDOCAINE HCL (CARDIAC) PF 100 MG/5ML IV SOSY
PREFILLED_SYRINGE | INTRAVENOUS | Status: DC | PRN
  Administered 2022-11-07: 60 mg via INTRATRACHEAL

## 2022-11-07 MED ORDER — OXYCODONE HCL 5 MG PO TABS
10.0000 mg | ORAL_TABLET | ORAL | Status: DC | PRN
  Administered 2022-11-07 – 2022-11-08 (×2): 15 mg via ORAL
  Administered 2022-11-08: 10 mg via ORAL
  Administered 2022-11-09 – 2022-11-10 (×4): 15 mg via ORAL
  Administered 2022-11-11: 10 mg via ORAL
  Filled 2022-11-07: qty 2
  Filled 2022-11-07 (×6): qty 3
  Filled 2022-11-07: qty 2

## 2022-11-07 MED ORDER — ACETAMINOPHEN 325 MG PO TABS
325.0000 mg | ORAL_TABLET | Freq: Four times a day (QID) | ORAL | Status: DC | PRN
  Administered 2022-11-09 (×2): 650 mg via ORAL
  Filled 2022-11-07 (×2): qty 2

## 2022-11-07 MED ORDER — LACTATED RINGERS IV SOLN: INTRAVENOUS | Status: DC

## 2022-11-07 MED ORDER — VANCOMYCIN HCL 1000 MG IV SOLR
INTRAVENOUS | Status: AC
  Filled 2022-11-07: qty 20

## 2022-11-07 MED ORDER — VITAMIN C 500 MG PO TABS
1000.0000 mg | ORAL_TABLET | Freq: Every day | ORAL | Status: DC
  Administered 2022-11-07 – 2022-11-11 (×5): 1000 mg via ORAL
  Filled 2022-11-07 (×5): qty 2

## 2022-11-07 MED ORDER — PHENYLEPHRINE HCL-NACL 20-0.9 MG/250ML-% IV SOLN
INTRAVENOUS | Status: DC | PRN
  Administered 2022-11-07: 50 ug/min via INTRAVENOUS

## 2022-11-07 MED ORDER — VANCOMYCIN HCL 1000 MG IV SOLR
INTRAVENOUS | Status: DC | PRN
  Administered 2022-11-07: 1000 mg via TOPICAL

## 2022-11-07 MED ORDER — FENTANYL CITRATE (PF) 100 MCG/2ML IJ SOLN
INTRAMUSCULAR | Status: AC
  Administered 2022-11-07: 100 ug via INTRAVENOUS
  Filled 2022-11-07: qty 2

## 2022-11-07 MED ORDER — ZINC SULFATE 220 (50 ZN) MG PO CAPS
220.0000 mg | ORAL_CAPSULE | Freq: Every day | ORAL | Status: DC
  Administered 2022-11-07 – 2022-11-11 (×5): 220 mg via ORAL
  Filled 2022-11-07 (×5): qty 1

## 2022-11-07 MED ORDER — ONDANSETRON HCL 4 MG/2ML IJ SOLN
INTRAMUSCULAR | Status: DC | PRN
  Administered 2022-11-07: 4 mg via INTRAVENOUS

## 2022-11-07 SURGICAL SUPPLY — 45 items
BAG COUNTER SPONGE SURGICOUNT (BAG) IMPLANT
BAG SPNG CNTER NS LX DISP (BAG)
BLADE SAW RECIP 87.9 MT (BLADE) ×1 IMPLANT
BLADE SURG 21 STRL SS (BLADE) ×1 IMPLANT
BNDG CMPR 5X6 CHSV STRCH STRL (GAUZE/BANDAGES/DRESSINGS) ×1
BNDG COHESIVE 6X5 TAN ST LF (GAUZE/BANDAGES/DRESSINGS) ×1 IMPLANT
CANISTER WOUND CARE 500ML ATS (WOUND CARE) IMPLANT
COVER SURGICAL LIGHT HANDLE (MISCELLANEOUS) ×1 IMPLANT
CUFF TOURN SGL QUICK 34 (TOURNIQUET CUFF)
CUFF TRNQT CYL 34X4.125X (TOURNIQUET CUFF) IMPLANT
DRAPE DERMATAC (DRAPES) IMPLANT
DRAPE INCISE 23X17 STRL (DRAPES) IMPLANT
DRAPE INCISE IOBAN 23X17 STRL (DRAPES) ×1 IMPLANT
DRAPE INCISE IOBAN 66X45 STRL (DRAPES) ×2 IMPLANT
DRAPE U-SHAPE 47X51 STRL (DRAPES) ×1 IMPLANT
DRESSING PREVENA PLUS CUSTOM (GAUZE/BANDAGES/DRESSINGS) ×1 IMPLANT
DRSG PREVENA PLUS CUSTOM (GAUZE/BANDAGES/DRESSINGS) ×2
DURAPREP 26ML APPLICATOR (WOUND CARE) ×1 IMPLANT
ELECT REM PT RETURN 9FT ADLT (ELECTROSURGICAL) ×1
ELECTRODE REM PT RTRN 9FT ADLT (ELECTROSURGICAL) ×1 IMPLANT
GLOVE BIOGEL PI IND STRL 7.5 (GLOVE) ×1 IMPLANT
GLOVE BIOGEL PI IND STRL 9 (GLOVE) ×1 IMPLANT
GLOVE SURG ORTHO 9.0 STRL STRW (GLOVE) ×1 IMPLANT
GLOVE SURG SS PI 6.5 STRL IVOR (GLOVE) ×1 IMPLANT
GOWN STRL REUS W/ TWL LRG LVL3 (GOWN DISPOSABLE) ×1 IMPLANT
GOWN STRL REUS W/ TWL XL LVL3 (GOWN DISPOSABLE) ×2 IMPLANT
GOWN STRL REUS W/TWL LRG LVL3 (GOWN DISPOSABLE) ×1
GOWN STRL REUS W/TWL XL LVL3 (GOWN DISPOSABLE) ×2
GRAFT SKIN WND MICRO 38 (Tissue) IMPLANT
KIT BASIN OR (CUSTOM PROCEDURE TRAY) ×1 IMPLANT
KIT DRSG PREVENA PLUS 7DAY 125 (MISCELLANEOUS) IMPLANT
KIT TURNOVER KIT B (KITS) ×1 IMPLANT
MANIFOLD NEPTUNE II (INSTRUMENTS) ×1 IMPLANT
NS IRRIG 1000ML POUR BTL (IV SOLUTION) ×1 IMPLANT
PACK ORTHO EXTREMITY (CUSTOM PROCEDURE TRAY) ×1 IMPLANT
PAD ARMBOARD 7.5X6 YLW CONV (MISCELLANEOUS) ×1 IMPLANT
PREVENA RESTOR ARTHOFORM 46X30 (CANNISTER) ×1 IMPLANT
STAPLER VISISTAT 35W (STAPLE) IMPLANT
STOCKINETTE IMPERVIOUS LG (DRAPES) IMPLANT
SUT ETHILON 2 0 PSLX (SUTURE) ×2 IMPLANT
SUT SILK 2 0 (SUTURE) ×1
SUT SILK 2-0 18XBRD TIE 12 (SUTURE) ×1 IMPLANT
TOWEL GREEN STERILE FF (TOWEL DISPOSABLE) ×1 IMPLANT
TUBE CONNECTING 20X1/4 (TUBING) ×1 IMPLANT
YANKAUER SUCT BULB TIP NO VENT (SUCTIONS) ×1 IMPLANT

## 2022-11-07 NOTE — Op Note (Signed)
11/07/2022  2:01 PM  PATIENT:  Mark [name].    PRE-OPERATIVE DIAGNOSIS:  Infected Right Total Knee Arthroplasty  POST-OPERATIVE DIAGNOSIS:  Same  PROCEDURE:  RIGHT ABOVE KNEE AMPUTATION Application of Prevena customizable wound VAC.  New line application of Kerecis micro graft 38 cm. Application of vancomycin powder 1 g.  SURGEON:  Nadara Mustard, MD  PHYSICIAN ASSISTANT: April Green ANESTHESIA:   General  PREOPERATIVE INDICATIONS:  Gottlieb Hartman. is a  67 y.o. male with a diagnosis of Infected Right Total Knee Arthroplasty who failed conservative measures and elected for surgical management.    The risks benefits and alternatives were discussed with the patient preoperatively including but not limited to the risks of infection, bleeding, nerve injury, cardiopulmonary complications, the need for revision surgery, among others, and the patient was willing to proceed.  OPERATIVE IMPLANTS:   Implant Name Type Inv. Item Serial No. Manufacturer Lot No. LRB No. Used Action  GRAFT SKIN WND MICRO 38 - [id number] Tissue GRAFT SKIN WND MICRO 38  [employer] [id number] Right 1 Implanted    @ENCIMAGES @  OPERATIVE FINDINGS: Tissue margins were clear amputation proximal to the retinaculum of the knee.  Tissue from within the femoral canal was sent for cultures.  OPERATIVE PROCEDURE: Patient was brought the operating room underwent a general anesthetic.  After adequate levels anesthesia were obtained patient's right lower extremity was prepped using DuraPrep draped into a sterile field a timeout was called.  A incision was made just proximal to the total knee arthroplasty this was carried down through the soft tissue and proximal to the retinaculum of the knee.  This was carried down to the femur.  The vascular bundle medially was clamped and suture-ligated with 2-0 silk.  The amputation was completed with a reciprocating saw sciatic nerve was pulled cut and allowed to retract.  The  tissue margins were clear electrocautery was used hemostasis the wound was irrigated with normal saline.  The distal femur had a cement spacer plug this was removed as well as the associated cement.  The femoral canal was curetted and this tissue was sent for cultures.  The canal was filled with Kerecis micro graft and 1 g vancomycin powder this was then used to cover the surface of the wound.  The deep and superficial fascial layers were closed using #1 Vicryl.  The skin was closed using 2-0 nylon.  The Prevena customizable wound VAC was applied this had a good suction fit patient was extubated taken the PACU in stable condition.   DISCHARGE PLANNING:  Antibiotic duration: 24 hours of antibiotics will adjust pending deep culture sensitivities  Weightbearing: Nonweightbearing on the right  Pain medication: Opioid pathway  Dressing care/ Wound VAC: Wound VAC for 1 week  Ambulatory devices: Walker  Discharge to: Anticipate discharge to inpatient versus outpatient rehab.  Follow-up: In the office 1 week post operative.

## 2022-11-07 NOTE — Transfer of Care (Signed)
Immediate Anesthesia Transfer of Care Note  Patient: Mark [name].  Procedure(s) Performed: RIGHT ABOVE KNEE AMPUTATION (Right: Knee)  Patient Location: PACU  Anesthesia Type:General  Level of Consciousness: drowsy and responds to stimulation  Airway & Oxygen Therapy: Patient Spontanous Breathing  Post-op Assessment: Report given to RN and Post -op Vital signs reviewed and stable  Post vital signs: Reviewed and stable  Last Vitals:  Vitals Value Taken Time  BP 102/63 11/07/22 1345  Temp    Pulse 102 11/07/22 1347  Resp 16 11/07/22 1347  SpO2 100 % 11/07/22 1347  Vitals shown include unfiled device data.  Last Pain:  Vitals:   11/07/22 1220  TempSrc:   PainSc: 0-No pain         Complications: No notable events documented.

## 2022-11-07 NOTE — H&P (Signed)
Mark [name]. is an 67 y.o. male.   Chief Complaint: Painful and infected right knee. HPI: Patient is a 67 year old gentleman who is seen in consultation for infectious disease initial evaluation. 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 Patient states that he had a history of a spinal infection about a year before his total knee arthroplasty. Patient has undergone total knee arthroplasty and revisions with Dr. Constance Goltz. Initial right total knee arthroplasty in 2015 with an antibiotic spacer placed in 2021. Reimplantation of antibiotic spacer April 2022 irrigation debridement right knee July 2022. Status post revision total knee arthroplasty.   Past Medical History:  Diagnosis Date   Alcohol abuse    Anemia    Arthritis    Ascites    Cirrhosis ([name])    Coffee ground emesis    Dehydration 06/17/2017   Febrile illness    GERD (gastroesophageal reflux disease)    Heart murmur    History of blood transfusion    Hyperlipidemia    Hypertension    Leg swelling    Myocardial infarction ([name]) 2012   Preop cardiovascular exam 04/14/2013   Sepsis ([name]) 06/17/2017   Septic shock ([name]) 06/18/2017   SIRS (systemic inflammatory response syndrome) ([name]) 07/11/2017   Stroke ([name]) 10/2009   TIA   Thrombocytopenia ([name])     Past Surgical History:  Procedure Laterality Date   COLONOSCOPY WITH PROPOFOL N/A 02/07/2016   Procedure: COLONOSCOPY WITH PROPOFOL;  Surgeon: Rachael Fee, MD;  Location: [facility];  Service: Endoscopy;  Laterality: N/A;   ESOPHAGOGASTRODUODENOSCOPY (EGD) WITH PROPOFOL N/A 02/07/2016   Procedure: ESOPHAGOGASTRODUODENOSCOPY (EGD) WITH PROPOFOL;  Surgeon: Rachael Fee, MD;  Location: [facility];  Service: Endoscopy;  Laterality: N/A;   ESOPHAGOGASTRODUODENOSCOPY (EGD) WITH PROPOFOL N/A 06/22/2017   Procedure: ESOPHAGOGASTRODUODENOSCOPY (EGD) WITH PROPOFOL;  Surgeon: Beverley Fiedler, MD;  Location: [facility]  ENDOSCOPY;  Service: Gastroenterology;  Laterality: N/A;   EXCISIONAL TOTAL KNEE ARTHROPLASTY WITH ANTIBIOTIC SPACERS Right 03/20/2020   Procedure: Resection right total knee arthroplasty and placement of antibiotic spacer;  Surgeon: Durene Romans, MD;  Location: [facility] ORS;  Service: Orthopedics;  Laterality: Right;  90 mins   HERNIA REPAIR Right    inguinal   INGUINAL HERNIA REPAIR Left 02/08/2021   Procedure: OPEN LEFT INGUINAL HERNIA REPAIR WITH MESH;  Surgeon: Abigail Miyamoto, MD;  Location: [facility] OR;  Service: General;  Laterality: Left;   IRRIGATION AND DEBRIDEMENT KNEE Right 11/13/2020   Procedure: IRRIGATION AND DEBRIDEMENT KNEE;  Surgeon: Durene Romans, MD;  Location: [facility] ORS;  Service: Orthopedics;  Laterality: Right;   JOINT REPLACEMENT     KNEE ARTHROSCOPY     bilateral/  12/14   REIMPLANTATION OF TOTAL KNEE Right 08/02/2020   Procedure: REIMPLANTATION/REVISION OF TOTAL KNEE WITH REMOVAL OF ANTIBIOTIC SPACER;  Surgeon: Durene Romans, MD;  Location: [facility] ORS;  Service: Orthopedics;  Laterality: Right;    TEE WITHOUT CARDIOVERSION N/A 05/13/2018   Procedure: TRANSESOPHAGEAL ECHOCARDIOGRAM (TEE);  Surgeon: Parke Poisson, MD;  Location: [facility] ENDOSCOPY;  Service: Cardiovascular;  Laterality: N/A;   TOTAL KNEE ARTHROPLASTY Right 05/02/2013   Procedure: RIGHT TOTAL KNEE ARTHROPLASTY;  Surgeon: Shelda Pal, MD;  Location: [facility] ORS;  Service: Orthopedics;  Laterality: Right;    Family History  Problem Relation Age of Onset   Hypertension Father    Diabetes Father    Dementia Mother    Lupus Sister    Social History:  reports  that he quit smoking about 6 years ago. His smoking use included cigarettes. He started smoking about 16 years ago. He has a 5 pack-year smoking history. He has never used smokeless tobacco. He reports current alcohol use. He reports current drug use. Drug: Marijuana.  Allergies: No Known Allergies  No medications prior to admission.    No results found  for this or any previous visit (from the past 48 hour(s)). No results found.  Review of Systems  All other systems reviewed and are negative.   There were no vitals taken for this visit. Physical Exam  Patient is alert, oriented, no adenopathy, well-dressed, normal affect, normal respiratory effort. Examination patient has right knee range of motion from 45 to 90 degrees with limited range of motion there is pain to palpation.  Radiograph shows a stemmed femoral and tibial component with loose cement in the femur. Assessment/Plan 1. Pyogenic arthritis of right knee joint, due to unspecified organism       Plan: With patient's multiple attempts of limb salvage with persistent infection and retained implant patient have recommended proceeding with a above-the-knee amputation.  Risks and benefits were discussed including persistent infection need for additional surgery.  With patient's stemmed femoral component he may require a higher level above-knee amputation.  Would try removal of the stem and antibiotics with attempted salvage of as much femur as possible.  Patient states that he would like to proceed with above-knee amputation surgery.  Nadara Mustard, MD 11/07/2022, 6:42 AM

## 2022-11-07 NOTE — Anesthesia Procedure Notes (Signed)
Procedure Name: LMA Insertion Date/Time: 11/07/2022 12:54 PM  Performed by: Loleta Zadia Uhde, CRNAPre-anesthesia Checklist: Patient identified, Patient being monitored, Timeout performed, Emergency Drugs available and Suction available Patient Re-evaluated:Patient Re-evaluated prior to induction Oxygen Delivery Method: Circle system utilized Preoxygenation: Pre-oxygenation with 100% oxygen Induction Type: IV induction Ventilation: Mask ventilation without difficulty LMA: LMA inserted LMA Size: 4.0 Tube type: Oral Number of attempts: 1 Placement Confirmation: positive ETCO2 and breath sounds checked- equal and bilateral Tube secured with: Tape Dental Injury: Teeth and Oropharynx as per pre-operative assessment

## 2022-11-07 NOTE — Interval H&P Note (Signed)
History and Physical Interval Note:  11/07/2022 12:13 PM  [name].  has presented today for surgery, with the diagnosis of Infected Right Total Knee Arthroplasty.  The various methods of treatment have been discussed with the patient and family. After consideration of risks, benefits and other options for treatment, the patient has consented to  Procedure(s): RIGHT ABOVE KNEE AMPUTATION (Right) as a surgical intervention.  The patient's history has been reviewed, patient examined, no change in status, stable for surgery.  I have reviewed the patient's chart and labs.  Questions were answered to the patient's satisfaction.     Mark [name]

## 2022-11-07 NOTE — Anesthesia Procedure Notes (Signed)
Anesthesia Regional Block: Femoral nerve block   Pre-Anesthetic Checklist: , timeout performed,  Correct Patient, Correct Site, Correct Laterality,  Correct Procedure, Correct Position, site marked,  Risks and benefits discussed,  Surgical consent,  Pre-op evaluation,  At surgeon's request and post-op pain management  Laterality: Lower and Right  Prep: chloraprep       Needles:  Injection technique: Single-shot  Needle Type: Stimulator Needle - 80     Needle Length: 9cm  Needle Gauge: 22   Needle insertion depth: 6 cm   Additional Needles:   Procedures:, nerve stimulator,,, ultrasound used (permanent image in chart),,     Nerve Stimulator or Paresthesia:  Response: Patellar snap, 0.5 mA  Additional Responses:   Narrative:  Injection made incrementally with aspirations every 5 mL.  Performed by: Personally  Anesthesiologist: Lewie Loron, MD  Additional Notes: BP cuff, EKG monitors applied. Sedation begun. Femoral artery palpated for location of nerve. After nerve location verified with U/S, anesthetic injected incrementally, slowly, and after negative aspirations under direct u/s guidance. Good perineural spread. Patient tolerated well.

## 2022-11-08 ENCOUNTER — Encounter (HOSPITAL_COMMUNITY): Payer: Self-pay | Admitting: Orthopedic Surgery

## 2022-11-08 NOTE — Plan of Care (Signed)
  Problem: Education: Goal: Understanding of discharge needs will improve Outcome: Progressing   Problem: Clinical Measurements: Goal: Postoperative complications will be avoided or minimized Outcome: Progressing   Problem: Pain Management: Goal: Pain level will decrease with appropriate interventions Outcome: Progressing   

## 2022-11-08 NOTE — Progress Notes (Signed)
Inpatient Rehab Admissions Coordinator:  Consult received. Await therapy evaluations to help determine appropriate rehab venue.   Lutisha Knoche Graves Madden, MS, CCC-SLP Admissions Coordinator [phone]  

## 2022-11-08 NOTE — Plan of Care (Signed)
  Problem: Education: Goal: Knowledge of the prescribed therapeutic regimen will improve Outcome: Progressing Goal: Ability to verbalize activity precautions or restrictions will improve Outcome: Progressing Goal: Understanding of discharge needs will improve Outcome: Progressing   Problem: Activity: Goal: Ability to perform//tolerate increased activity and mobilize with assistive devices will improve Outcome: Progressing   Problem: Clinical Measurements: Goal: Postoperative complications will be avoided or minimized Outcome: Progressing

## 2022-11-08 NOTE — Evaluation (Signed)
Physical Therapy Evaluation Patient Details Name: Mark [name]. MRN: [id number] DOB: 1956/03/14 Today's Date: 11/08/2022  History of Present Illness  67 y.o. male with a diagnosis of Infected Right Total Knee Arthroplasty who failed conservative measures and elected for surgical management.  He is now s/p R AKA 11/07/22. PMH of GERD, Arthritis, TIA, SIRS,  Clinical Impression  PTA pt living with wife in single story home with 2 steps to enter. Pt was mod I using crutches for ambulation, and independent with ADLs and iADLs. Pt is currently limited in safe mobility by decreased ability to accept loss of his R LE, this limits his ability to look at his residual limb, and he is experiencing increase phantom leg feelings. Pt becomes very tearful when talking about loss of his limb and has increased anxiety when he can not volitionally relax his hip flexors and his residual limb is in extremely flexed position. PT attempts to provide relaxation techniques and education on phantom limb feelings which pt politely listens to but then asks not to be told again. Pt want to return to his prior level of complete independence and has increase family support. Patient will benefit from intensive inpatient follow up therapy, >3 hours/day. Pt has asked PT to return tomorrow for further mobility and exercise.        Assistance Recommended at Discharge Frequent or constant Supervision/Assistance  If plan is discharge home, recommend the following:  Can travel by private vehicle  A little help with walking and/or transfers;A little help with bathing/dressing/bathroom;Assistance with cooking/housework;Assist for transportation;Help with stairs or ramp for entrance        Equipment Recommendations Wheelchair (measurements PT);Wheelchair cushion (measurements PT)  Recommendations for Other Services  Other (comment) (chaplain consult)    Functional Status Assessment Patient has had a recent decline in their  functional status and demonstrates the ability to make significant improvements in function in a reasonable and predictable amount of time.     Precautions / Restrictions Precautions Precautions: Fall Precaution Comments: change in CoG Restrictions Weight Bearing Restrictions: Yes RLE Weight Bearing: Non weight bearing      Mobility  Bed Mobility Overal bed mobility: Needs Assistance Bed Mobility: Supine to Sit     Supine to sit: Mod assist     General bed mobility comments: modA with use of bed pad mainly because pt could not figure out how to relax his R hip and residual limb remained in extreme flexion    Transfers Overall transfer level: Needs assistance Equipment used: None Transfers: Bed to chair/wheelchair/BSC       Squat pivot transfers: Min assist     General transfer comment: pt becomes restless about getting up to recliner, and starts transfer without outside assist, PT provides support at the hips and pt able to pivot to recliner with min A    Ambulation/Gait               General Gait Details: deferred today      Balance Overall balance assessment: Needs assistance Sitting-balance support: Feet supported, Bilateral upper extremity supported Sitting balance-[name]: Fair Sitting balance - Comments: benefits from outside support ultimately able to static sit with B UE support   Standing balance support: Bilateral upper extremity supported, During functional activity Standing balance-[name]: Poor Standing balance comment: does not come to fully standing  Pertinent Vitals/Pain Pain Assessment Pain Assessment: No/denies pain (but does report itching in R foot and feeling like leg is still there which he finds very upsetting)    Home Living Family/patient expects to be discharged to:: Private residence Living Arrangements: Spouse/significant other Available Help at Discharge: Family;Available 24  hours/day Type of Home: House Home Access: Stairs to enter Entrance Stairs-Rails: None Entrance Stairs-Number of Steps: 2   Home Layout: One level Home Equipment: Agricultural consultant (2 wheels);BSC/3in1;Grab bars - tub/shower;Shower seat;Crutches      Prior Function Prior Level of Function : Independent/Modified Independent             Mobility Comments: ambulated with crutches ADLs Comments: ind     Hand Dominance   Dominant Hand: Right    Extremity/Trunk Assessment   Upper Extremity Assessment Upper Extremity Assessment: Defer to OT evaluation    Lower Extremity Assessment Lower Extremity Assessment: RLE deficits/detail RLE Deficits / Details: R AKA, can not volitionally move at hip, in extemely flexed position throughout session, to point that pt has breakdown when he comes to seated and can not relax his residual limb down to the EoB       Communication   Communication: No difficulties  Cognition Arousal/Alertness: Awake/alert Behavior During Therapy: Anxious, Agitated (tearful due to loss of L LE) Overall Cognitive Status: Impaired/Different from baseline                                 General Comments: pt very upset about AKA, will not look at residual limb, prefers to have it covered by pile of blankets, provided extensive education about phantom leg pains/feeling, listens and at the end says that is all fine but I never want to hear about that again.        General Comments General comments (skin integrity, edema, etc.): Pt with increased denial about AKA, refusing to look at residual limb, does better when family in room and he is sitting up in recliner with blanket over his legs,        Assessment/Plan    PT Assessment Patient needs continued PT services  PT Problem List Decreased range of motion;Decreased activity tolerance;Decreased balance;Decreased mobility;Decreased coordination;Decreased safety awareness;Impaired sensation;Pain        PT Treatment Interventions DME instruction;Gait training;Stair training;Functional mobility training;Therapeutic activities;Therapeutic exercise;Balance training;Cognitive remediation;Patient/family education    PT Goals (Current goals can be found in the Care Plan section)  Acute Rehab PT Goals PT Goal Formulation: With patient Time For Goal Achievement: 11/22/22 Potential to Achieve Goals: Fair    Frequency Min 4X/week        AM-PAC PT "6 Clicks" Mobility  Outcome Measure Help needed turning from your back to your side while in a flat bed without using bedrails?: None Help needed moving from lying on your back to sitting on the side of a flat bed without using bedrails?: A Lot Help needed moving to and from a bed to a chair (including a wheelchair)?: A Little Help needed standing up from a chair using your arms (e.g., wheelchair or bedside chair)?: A Lot Help needed to walk in hospital room?: A Lot Help needed climbing 3-5 steps with a railing? : A Lot 6 Click Score: 15    End of Session   Activity Tolerance: Other (comment) (limited by decreased ability to come to terms with sx) Patient left: in chair;with call bell/phone within reach;with chair alarm set;with family/visitor present  Nurse Communication: Mobility status;Other (comment) (denial of loss of limb) PT Visit Diagnosis: Unsteadiness on feet (R26.81);Other abnormalities of gait and mobility (R26.89);Difficulty in walking, not elsewhere classified (R26.2);Other symptoms and signs involving the nervous system (R29.898);Pain Pain - Right/Left: Right Pain - part of body: Leg    Time: 1241-1320 PT Time Calculation (min) (ACUTE ONLY): 39 min   Charges:   PT Evaluation $PT Eval Moderate Complexity: 1 Mod PT Treatments $Therapeutic Activity: 8-22 mins PT General Charges $$ ACUTE PT VISIT: 1 Visit         Cincere Zorn B. Beverely Risen PT, DPT Acute Rehabilitation Services Please use secure chat or  Call Office [phone]   Elon Alas [facility] 11/08/2022, 2:17 PM

## 2022-11-08 NOTE — Progress Notes (Signed)
Patient ID: Mark Norfolk., male   DOB: 08/10/1955, 67 y.o.   MRN: [id number] Patient is postoperative day 1 above-the-knee amputation for septic total knee arthroplasty.  There was a cement plug with cement proximal to the amputation.  This was removed tissue was sent for cultures.  The wound VAC is functioning well there is no drainage.  Cultures are pending.  Patient denies any pain.

## 2022-11-08 NOTE — Evaluation (Signed)
Occupational Therapy Evaluation Patient Details Name: Mark [name]. MRN: [id number] DOB: [birth year] Today's Date: 11/08/2022   History of Present Illness 67 y.o. male with a diagnosis of Infected Right Total Knee Arthroplasty who failed conservative measures and elected for surgical management.  He is now s/p R AKA 11/07/22. PMH of GERD, Arthritis, TIA, SIRS,   Clinical Impression   Pt admitted for above dx, PTA pt reports being ind/mod I in mobility and bADLs. Pt currently having hard time accepting new body image and does not like to look at new amputation. Pt min A for bADLs and pivot transfers, his balance is impaired due to the new adjustment. Pt would benefit form continued acute skilled OT services to address deficits and help transition to next level of care. Patient has the potential to reach Mod I and demos the ability to tolerate 3 hours of therapy. Pt would benefit from an intensive rehab program to help maximize functional independence.      Recommendations for follow up therapy are one component of a multi-disciplinary discharge planning process, led by the attending physician.  Recommendations may be updated based on patient status, additional functional criteria and insurance authorization.   Assistance Recommended at Discharge Frequent or constant Supervision/Assistance  Patient can return home with the following A little help with walking and/or transfers;A little help with bathing/dressing/bathroom;Assistance with cooking/housework;Assist for transportation;Help with stairs or ramp for entrance    Functional Status Assessment  Patient has had a recent decline in their functional status and demonstrates the ability to make significant improvements in function in a reasonable and predictable amount of time.  Equipment Recommendations  Tub/shower bench    Recommendations for Other Services       Precautions / Restrictions Precautions Precautions: Fall Precaution  Comments: change in CoG Restrictions Weight Bearing Restrictions: Yes RLE Weight Bearing: Non weight bearing      Mobility Bed Mobility Overal bed mobility: Needs Assistance Bed Mobility: Supine to Sit     Supine to sit: Mod assist     General bed mobility comments: modA with use of bed pad mainly because pt could not figure out how to relax his R hip and residual limb remained in extreme flexion    Transfers Overall transfer level: Needs assistance Equipment used: None Transfers: Bed to chair/wheelchair/BSC     Squat pivot transfers: Min assist       General transfer comment: pt becomes restless about getting up to recliner, and starts transfer without outside assist, PT provides support at the hips and pt able to pivot to recliner with min A      Balance Overall balance assessment: Needs assistance Sitting-balance support: Feet supported, Bilateral upper extremity supported Sitting balance-[name]: Fair Sitting balance - Comments: benefits from outside support ultimately able to static sit with B UE support   Standing balance support: Bilateral upper extremity supported, During functional activity Standing balance-[name]: Poor Standing balance comment: does not come to fully standing                           ADL either performed or assessed with clinical judgement   ADL Overall ADL's : Needs assistance/impaired Eating/Feeding: Independent;Bed level   Grooming: Sitting;Min guard   Upper Body Bathing: Supervision/ safety;Set up;Sitting   Lower Body Bathing: Sitting/lateral leans;Min guard   Upper Body Dressing : Sitting;Supervision/safety;Set up   Lower Body Dressing: Sitting/lateral leans;Minimal assistance   Toilet Transfer: Minimal assistance;Stand-pivot  Toileting- Architect and Hygiene: Minimal assistance               Vision         Perception     Praxis      Pertinent Vitals/Pain Pain Assessment Pain  Assessment: No/denies pain (but does report itching in R foot and feeling like leg is still there which he finds very upsetting)     Hand Dominance Right   Extremity/Trunk Assessment Upper Extremity Assessment Upper Extremity Assessment: Overall WFL for tasks assessed   Lower Extremity Assessment Lower Extremity Assessment: RLE deficits/detail RLE Deficits / Details: R AKA, can not volitionally move at hip, in extemely flexed position throughout session, to point that pt has breakdown when he comes to seated and can not relax his residual limb down to the EoB       Communication Communication Communication: No difficulties   Cognition Arousal/Alertness: Awake/alert Behavior During Therapy: Anxious, Agitated (tearful due to loss of L LE) Overall Cognitive Status: Impaired/Different from baseline                                 General Comments: pt very upset about AKA, will not look at residual limb, prefers to have it covered by pile of blankets, provided extensive education about phantom leg pains/feeling, listens and at the end says that is all fine but I never want to hear about that again.     General Comments  Pt with increased denial about AKA, refusing to look at residual limb, does better when family in room and he is sitting up in recliner with blanket over his legs,. Pt noted to have nose bleed during session that later ceased    Exercises     Shoulder Instructions      Home Living Family/patient expects to be discharged to:: Private residence Living Arrangements: Spouse/significant other Available Help at Discharge: Family;Available 24 hours/day Type of Home: House Home Access: Stairs to enter [employer] of Steps: 2 Entrance Stairs-Rails: None Home Layout: One level     Bathroom Shower/Tub: Chief Strategy Officer: Standard     Home Equipment: Agricultural consultant (2 wheels);BSC/3in1;Grab bars - tub/shower;Shower  seat;Crutches          Prior Functioning/Environment Prior Level of Function : Independent/Modified Independent             Mobility Comments: ambulated with crutches ADLs Comments: ind        OT Problem List: Impaired balance (sitting and/or standing)      OT Treatment/Interventions: Self-care/ADL training;Therapeutic exercise;Cognitive remediation/compensation;Therapeutic activities;Patient/family education;Balance training    OT Goals(Current goals can be found in the care plan section) Acute Rehab OT Goals Patient Stated Goal: None stated OT Goal Formulation: With patient Time For Goal Achievement: 11/22/22 Potential to Achieve Goals: Good ADL Goals Pt Will Perform Grooming: sitting;with set-up;with supervision Pt Will Perform Lower Body Dressing: with set-up;with supervision;sitting/lateral leans Pt Will Transfer to Toilet: stand pivot transfer;with supervision Additional ADL Goal #1: Pt will demonstrated independent use of strategies to reduce phantom sensation.  OT Frequency: Min 2X/week    Co-evaluation PT/OT/SLP Co-Evaluation/Treatment: Yes Reason for Co-Treatment: Complexity of the patient's impairments (multi-system involvement);Necessary to address cognition/behavior during functional activity PT goals addressed during session: Mobility/safety with mobility;Balance OT goals addressed during session: ADL's and self-care      AM-PAC OT "6 Clicks" Daily Activity     Outcome Measure Help from another  person eating meals?: None Help from another person taking care of personal grooming?: A Little Help from another person toileting, which includes using toliet, bedpan, or urinal?: A Little Help from another person bathing (including washing, rinsing, drying)?: A Little Help from another person to put on and taking off regular upper body clothing?: A Little Help from another person to put on and taking off regular lower body clothing?: A Little 6 Click Score:  19   End of Session Nurse Communication: Mobility status  Activity Tolerance: Treatment limited secondary to agitation Patient left: in chair;with call bell/phone within reach;with chair alarm set;with family/visitor present  OT Visit Diagnosis: Unsteadiness on feet (R26.81);Other abnormalities of gait and mobility (R26.89);Other symptoms and signs involving cognitive function                Time: 1610-9604 OT Time Calculation (min): 39 min Charges:  OT General Charges $OT Visit: 1 Visit OT Evaluation $OT Eval Moderate Complexity: 1 Mod  11/08/2022  AB, OTR/L  Acute Rehabilitation Services  Office: [phone]   Tristan Schroeder 11/08/2022, 2:40 PM

## 2022-11-08 NOTE — Plan of Care (Signed)
  Problem: Clinical Measurements: Goal: Postoperative complications will be avoided or minimized Outcome: Progressing   Problem: Nutrition: Goal: Adequate nutrition will be maintained Outcome: Progressing   

## 2022-11-09 MED ORDER — METHOCARBAMOL 500 MG PO TABS
500.0000 mg | ORAL_TABLET | Freq: Four times a day (QID) | ORAL | Status: DC | PRN
  Administered 2022-11-09 – 2022-11-10 (×4): 500 mg via ORAL
  Filled 2022-11-09 (×4): qty 1

## 2022-11-09 MED ORDER — PNEUMOCOCCAL 20-VAL CONJ VACC 0.5 ML IM SUSY
0.5000 mL | PREFILLED_SYRINGE | INTRAMUSCULAR | Status: DC
  Filled 2022-11-09: qty 0.5

## 2022-11-09 MED ORDER — ENSURE ENLIVE PO LIQD
237.0000 mL | Freq: Two times a day (BID) | ORAL | Status: DC
  Administered 2022-11-10: 237 mL via ORAL

## 2022-11-09 NOTE — Progress Notes (Signed)
Physical Therapy Treatment Patient Details Name: Mark [name]. MRN: [id number] DOB: Jan 05, 1956 Today's Date: 11/09/2022   History of Present Illness 67 y.o. male with a diagnosis of Infected Right Total Knee Arthroplasty who failed conservative measures and elected for surgical management.  He is now s/p R AKA 11/07/22. PMH of GERD, Arthritis, TIA, SIRS,    PT Comments  Pt with much better acceptance of situation today. Able to ambulate at a min A level in hallway with RW and use the toilet without assistance. Pt impressed with his abilities and able to tolerate looking at his residual limb. Reports he is still having difficulty coming to terms with loss of his leg, but is hopeful for continued success and prosthetic placement in future.     Assistance Recommended at Discharge Frequent or constant Supervision/Assistance  If plan is discharge home, recommend the following:  Can travel by private vehicle    A little help with walking and/or transfers;A little help with bathing/dressing/bathroom;Assistance with cooking/housework;Assist for transportation;Help with stairs or ramp for entrance      Equipment Recommendations  Wheelchair (measurements PT);Wheelchair cushion (measurements PT)       Precautions / Restrictions Precautions Precautions: Fall Restrictions Weight Bearing Restrictions: Yes RLE Weight Bearing: Non weight bearing     Mobility  Bed Mobility Overal bed mobility: Needs Assistance Bed Mobility: Sidelying to Sit   Sidelying to sit: Supervision       General bed mobility comments: cues for how to turn hips in the bed to scoot to edge of bed. pt. in long sitting and reports he is scared to bump or hurt his limb. initally trying to do a lateral scoot. reviewed and provided guidange to move LLE and then turn hips. he was able to push with bues and turn on the pad towards eob with good safety and care to the limb.    Transfers Overall transfer level: Needs  assistance Equipment used: Rolling walker (2 wheels) Transfers: Sit to/from Stand, Bed to chair/wheelchair/BSC Sit to Stand: Mod assist, +2 physical assistance Stand pivot transfers: Min assist         General transfer comment: cues for hand placement and posture.  intially rw too tall, adjusted to proper height and posture improved to allow pt. to push through bues.  eager to walk and transfer.  cues to slow pace for safety.    Ambulation/Gait Ambulation/Gait assistance: Min assist, Mod assist, +2 safety/equipment Gait Distance (Feet): 80 Feet Assistive device: Rolling walker (2 wheels) Gait Pattern/deviations: Step-to pattern Gait velocity: appropriate Gait velocity interpretation: 1.31 - 2.62 ft/sec, indicative of limited community ambulator   General Gait Details: pt eager to get up and move around, initially ambulating in room with mod A, RW adjusted for improved UE mechanics and pt request to ambulate in hallway with minA. pt with urge to have BM and sat in recliner for transfer back into room. Pt able to ambulate into bathroom and sit on toilet with min A      Balance Overall balance assessment: Needs assistance Sitting-balance support: Feet supported, Bilateral upper extremity supported Sitting balance-[name]: Fair Sitting balance - Comments: benefit from B UE support for steadying   Standing balance support: Bilateral upper extremity supported, During functional activity Standing balance-[name]: Poor Standing balance comment: requires UE support for standing balance                            Cognition  Behavior During Therapy: WFL for tasks assessed/performed Overall Cognitive Status: Within Functional Limits for tasks assessed                                 General Comments: better spirits today than previous day.  vocalizing his feelings and emotions towards limb.  able to look at it today, would not engage in rubbing or  touching it when he noted phantom leg sensations.  reports that he knows he will be fine with all the next phases and steps towards a prosthesis but says the initial acceptance is very hard for him.  he remains highly motivated and in very good spirits throughout session           General Comments General comments (skin integrity, edema, etc.): Improved acceptance of situation, appreciates independence in mobility using RW able to look at residual limb at end of session      Pertinent Vitals/Pain Pain Assessment Pain Assessment: No/denies pain     PT Goals (current goals can now be found in the care plan section) Acute Rehab PT Goals PT Goal Formulation: With patient Time For Goal Achievement: 11/22/22 Potential to Achieve Goals: Fair Progress towards PT goals: Progressing toward goals    Frequency    Min 4X/week      PT Plan Current plan remains appropriate    Co-evaluation PT/OT/SLP Co-Evaluation/Treatment: Yes Reason for Co-Treatment: To address functional/ADL transfers PT goals addressed during session: Mobility/safety with mobility OT goals addressed during session: ADL's and self-care;Proper use of Adaptive equipment and DME      AM-PAC PT "6 Clicks" Mobility   Outcome Measure  Help needed turning from your back to your side while in a flat bed without using bedrails?: None Help needed moving from lying on your back to sitting on the side of a flat bed without using bedrails?: A Little Help needed moving to and from a bed to a chair (including a wheelchair)?: A Little Help needed standing up from a chair using your arms (e.g., wheelchair or bedside chair)?: A Little Help needed to walk in hospital room?: A Little Help needed climbing 3-5 steps with a railing? : A Lot 6 Click Score: 18    End of Session Equipment Utilized During Treatment: Gait belt Activity Tolerance: Patient tolerated treatment well Patient left: with call bell/phone within reach;in  bed;with bed alarm set Nurse Communication: Mobility status PT Visit Diagnosis: Unsteadiness on feet (R26.81);Other abnormalities of gait and mobility (R26.89);Difficulty in walking, not elsewhere classified (R26.2);Other symptoms and signs involving the nervous system (R29.898);Pain Pain - Right/Left: Right Pain - part of body: Leg     Time: 1120-1155 PT Time Calculation (min) (ACUTE ONLY): 35 min  Charges:    $Gait Training: 8-22 mins PT General Charges $$ ACUTE PT VISIT: 1 Visit                     Nayeliz Hipp B. Beverely Risen PT, DPT Acute Rehabilitation Services Please use secure chat or  Call Office [phone]    Elon Alas [facility] 11/09/2022, 2:13 PM

## 2022-11-09 NOTE — Progress Notes (Signed)
Inpatient Rehab Admissions:  Inpatient Rehab Consult received.  I met with patient at the bedside for rehabilitation assessment and to discuss goals and expectations of an inpatient rehab admission.  Pt acknowledged understanding. Pt would prefer outpatient therapy or HH therapy instead of CIR. TOC made aware.   Signed: Wolfgang Phoenix, MS, CCC-SLP Admissions Coordinator [phone]

## 2022-11-09 NOTE — Progress Notes (Signed)
Occupational Therapy Treatment Patient Details Name: Mark [name]. MRN: [id number] DOB: [birth year] Today's Date: 11/09/2022   History of present illness 67 y.o. male with a diagnosis of Infected Right Total Knee Arthroplasty who failed conservative measures and elected for surgical management.  He is now s/p R AKA 11/07/22. PMH of GERD, Arthritis, TIA, SIRS,   OT comments  Pt. Seen with PT for skilled therapy session.  Focus on safe transfers and mobility in prep for and during adl completion.  Pt in better spirits today than previous session.  Able to talk more about his feeling with new physical presentation.  Eager and motivated to continue with therapies and prepare for next steps ahead for RLE.  Cues for bed mobility as pt. Expresses fear of bumping or hurting surgical site on RLE.  Heavy assist for initial sit/stand but receptive to cues for use of b ues to aide in posture and ambulation with rw.  Completed toileting task min/mod a.  Refer to PT notes for mobility and transfer details.  Next session cont. with focus on safe slower pace during mobility/adls.       Recommendations for follow up therapy are one component of a multi-disciplinary discharge planning process, led by the attending physician.  Recommendations may be updated based on patient status, additional functional criteria and insurance authorization.    Assistance Recommended at Discharge Frequent or constant Supervision/Assistance  Patient can return home with the following  A little help with walking and/or transfers;A little help with bathing/dressing/bathroom;Assistance with cooking/housework;Assist for transportation;Help with stairs or ramp for entrance   Equipment Recommendations  Tub/shower bench    Recommendations for Other Services      Precautions / Restrictions Precautions Precautions: Fall Restrictions RLE Weight Bearing: Non weight bearing       Mobility Bed Mobility Overal bed mobility: Needs  Assistance Bed Mobility: Sidelying to Sit   Sidelying to sit: Supervision       General bed mobility comments: cues for how to turn hips in the bed to scoot to edge of bed. pt. in long sitting and reports he is scared to bump or hurt his limb. initally trying to do a lateral scoot. reviewed and provided guidange to move LLE and then turn hips. he was able to push with bues and turn on the pad towards eob with good safety and care to the limb.    Transfers Overall transfer level: Needs assistance Equipment used: Rolling walker (2 wheels) Transfers: Sit to/from Stand, Bed to chair/wheelchair/BSC Sit to Stand: Mod assist, +2 physical assistance Stand pivot transfers: Min assist         General transfer comment: cues for hand placement and posture.  intially rw too tall, adjusted to proper height and posture improved to allow pt. to push through bues.  eager to walk and transfer.  cues to slow pace for safety.     Balance                                           ADL either performed or assessed with clinical judgement   ADL Overall ADL's : Needs assistance/impaired                           Toilet Transfer Details (indicate cue type and reason): PT assisted with the toilet transfer (i had stepped out  of the room briefly) refer to her note for further details Toileting- Architect and Hygiene: Min guard;Sit to/from stand Toileting - Clothing Manipulation Details (indicate cue type and reason): pt. with therapist asst. support on rw and he was able to pull up undergarments with one hand adjusting each side over hips       General ADL Comments: cues for safe hand placement on grab bars vs. only one hand on rw when trying to stand to adj. under garments    Extremity/Trunk Assessment              Vision       Perception     Praxis      Cognition   Behavior During Therapy: [facility] for tasks assessed/performed Overall Cognitive  Status: Within Functional Limits for tasks assessed                                 General Comments: better spirits today than previous day.  vocalizing his feelings and emotions towards limb.  able to look at it today, would not engage in rubbing or touching it when he noted phantom leg sensations.  reports that he knows he will be fine with all the next phases and steps towards a prosthesis but says the initial acceptance is very hard for him.  he remains highly motivated and in very good spirits throughout session        Exercises      Shoulder Instructions       General Comments      Pertinent Vitals/ Pain       Pain Assessment Pain Assessment: No/denies pain  Home Living                                          Prior Functioning/Environment              Frequency  Min 2X/week        Progress Toward Goals  OT Goals(current goals can now be found in the care plan section)  Progress towards OT goals: Progressing toward goals     Plan Discharge plan remains appropriate    Co-evaluation      Reason for Co-Treatment: To address functional/ADL transfers   OT goals addressed during session: ADL's and self-care;Proper use of Adaptive equipment and DME      AM-PAC OT "6 Clicks" Daily Activity     Outcome Measure   Help from another person eating meals?: None Help from another person taking care of personal grooming?: A Little Help from another person toileting, which includes using toliet, bedpan, or urinal?: A Little Help from another person bathing (including washing, rinsing, drying)?: A Little Help from another person to put on and taking off regular upper body clothing?: A Little Help from another person to put on and taking off regular lower body clothing?: A Little 6 Click Score: 19    End of Session Equipment Utilized During Treatment: Gait belt;Rolling walker (2 wheels)  OT Visit Diagnosis: Unsteadiness on feet  (R26.81);Other abnormalities of gait and mobility (R26.89);Other symptoms and signs involving cognitive function   Activity Tolerance Patient tolerated treatment well   Patient Left in bed;with call bell/phone within reach   Nurse Communication          Time: 1610-9604 OT Time Calculation (min): 35  min  Charges: OT General Charges $OT Visit: 1 Visit OT Treatments $Self Care/Home Management : 8-22 mins  Boneta Lucks, COTA/L Acute Rehabilitation [phone]   Alessandra Bevels Lorraine-COTA/L 11/09/2022, 1:08 PM

## 2022-11-09 NOTE — Care Management (Cosign Needed)
    Durable Medical Equipment  (From admission, onward)           Start     Ordered   11/09/22 1309  For home use only DME standard manual wheelchair with seat cushion  Once       Comments: Patient suffers from Right Above Knee Amputation which impairs their ability to perform daily activities like bathing, dressing, feeding, grooming, and toileting in the home.  A walker will not resolve issue with performing activities of daily living. A wheelchair will allow patient to safely perform daily activities. Patient can safely propel the wheelchair in the home or has a caregiver who can provide assistance. Length of need Lifetime. Accessories: elevating leg rests (ELRs), wheel locks, extensions and anti-tippers.   11/09/22 1313

## 2022-11-09 NOTE — Progress Notes (Signed)
Patient ID: Mark [name]., male   DOB: 03-15-1956, 67 y.o.   MRN: [id number] Patient is status post above-knee amputation.  Cultures are pending.  There is no drainage in the wound VAC canister.  Recommendation for inpatient rehab.  Awaiting authorization.

## 2022-11-09 NOTE — TOC Initial Note (Signed)
Transition of Care (TOC) - Initial/Assessment Note    Patient Details  Name: Mark [name]. MRN: [id number] Date of Birth: March 29, 1956  Transition of Care [facility]) CM/SW Contact:    Ronny Bacon, RN Phone Number: 11/09/2022, 1:18 PM  Clinical Narrative:   Patient from home. Declined CIR admission. HH PT/OT arranged through Delila Spence. WC with cushion ordered through [employer].                Expected Discharge Plan: Home w Home Health Services Barriers to Discharge: Continued Medical Work up   Patient Goals and CMS Choice Patient states their goals for this hospitalization and ongoing recovery are:: to go home          Expected Discharge Plan and Services                         DME Arranged: Community education officer wheelchair with seat cushion DME Agency: [employer] Date DME Agency Contacted: 11/09/22 Time DME Agency Contacted: 1308 Representative spoke with at DME Agency: Vaughan Basta HH Arranged: PT, OT HH Agency: [facility] Date [facility] Agency Contacted: 11/09/22 Time HH Agency Contacted: 1251 Representative spoke with at [facility] Agency: Kandee Keen  Prior Living Arrangements/Services     Patient language and need for interpreter reviewed:: Yes Do you feel safe going back to the place where you live?: Yes      Need for Family Participation in Patient Care: Yes (Comment) Care giver support system in place?: Yes (comment)   Criminal Activity/Legal Involvement Pertinent to Current Situation/Hospitalization: No - Comment as needed  Activities of Daily Living Home Assistive Devices/Equipment: Crutches, Cane (specify quad or straight), Walker (specify type) ADL Screening (condition at time of admission) Patient's cognitive ability adequate to safely complete daily activities?: Yes Is the patient deaf or have difficulty hearing?: No Does the patient have difficulty seeing, even when wearing glasses/contacts?: No Does the patient have difficulty  concentrating, remembering, or making decisions?: No Patient able to express need for assistance with ADLs?: Yes Does the patient have difficulty dressing or bathing?: No Independently performs ADLs?: Yes (appropriate for developmental age) Does the patient have difficulty walking or climbing stairs?: Yes Weakness of Legs: None Weakness of Arms/Hands: None  Permission Sought/Granted Permission sought to share information with : Facility Medical sales representative                Emotional Assessment   Attitude/Demeanor/Rapport: Engaged Affect (typically observed): Appropriate Orientation: : Oriented to Self, Oriented to Place, Oriented to  Time, Oriented to Situation Alcohol / Substance Use: Not Applicable Psych Involvement: No (comment)  Admission diagnosis:  Hx of AKA (above knee amputation), right ([name]) [Z89.611] Patient Active Problem List   Diagnosis Date Noted   Infection of total knee replacement ([name]) 11/07/2022   Hx of AKA (above knee amputation), right ([name]) 11/07/2022   Right knee pain 07/08/2022   Cerebrovascular disease 07/08/2022   Renal cyst 07/08/2022   Protein-calorie malnutrition, severe 07/08/2022   Toxic metabolic encephalopathy 02/26/2022   Septic joint of right knee joint ([name]) 11/08/2020   S/P right TKA reimplantation 08/02/2020   Infection of total left knee replacement ([name]) 03/20/2020   Cirrhosis of liver without ascites ([name])    AKI (acute kidney injury) ([name])    Bacteremia    Spinal stenosis of lumbar region without neurogenic claudication    Aspiration pneumonia ([name]) 09/24/2018   Abscess in epidural space of lumbar spine    MRSA  bacteremia 09/21/2018   Infection of prosthetic right knee joint ([name]) 09/20/2018   Anemia of chronic disease 09/20/2018   Hypoalbuminemia 09/20/2018   Hypoglycemia without diagnosis of diabetes mellitus 09/20/2018   Epidural abscess 09/20/2018   Hyperkalemia 05/18/2018   Edentulous 05/11/2018   Pancytopenia ([name])  05/10/2018   CAD (coronary artery disease) 05/10/2018   Hepatic encephalopathy ([name]) 05/10/2018   Alcohol abuse 05/10/2018   GERD (gastroesophageal reflux disease) 05/10/2018   Duodenal ulcer    Renal failure    Acute on chronic anemia    Hypotension 06/17/2017   Hyponatremia 06/17/2017   Leg edema, right 06/17/2017   Acute metabolic encephalopathy 06/17/2017   Anemia, iron deficiency    Benign neoplasm of ascending colon    Hemorrhoids    Portal hypertensive gastropathy ([name])    Gastritis and gastroduodenitis    Esophageal varices without bleeding ([name])    H/O: CVA (cerebrovascular accident) 12/01/2013   ACS (acute coronary syndrome) ([name]) 12/01/2013   Anasarca 12/01/2013   Polysubstance abuse ([name]) 12/01/2013   CKD (chronic kidney disease) stage 3, GFR 30-59 ml/min ([name]) 12/01/2013   S/P right TKA 05/02/2013   Murmur 04/14/2013   Right inguinal hernia 12/26/2010   Cirrhosis, alcoholic ([name]) 12/20/2010   PCP:  Deeann Saint, MD Pharmacy:   [facility] [phone] Gin[place] Otto, [place] - [address] AT [address]  [place] [address] Phone: [phone] Fax: [phone]  [place] [facility] [address] [place] [address] Phone: [phone] Fax: [phone]     Social Determinants of Health (SDOH) Social History: SDOH Screenings   Food Insecurity: No Food Insecurity (07/10/2022)  Housing: Low Risk  (07/14/2022)  Transportation Needs: No Transportation Needs (07/14/2022)  Utilities: Not At Risk (07/08/2022)  Alcohol Screen: Low Risk  (03/14/2020)  Depression (PHQ2-9): Low Risk  (08/21/2022)  Financial Resource Strain: Low Risk  (05/16/2021)  Physical Activity: Sufficiently Active (05/16/2021)  Social Connections: Socially Integrated (05/16/2021)  Stress: No Stress Concern Present (05/16/2021)  Tobacco Use: Medium Risk (11/07/2022)   SDOH Interventions:     Readmission Risk Interventions    03/03/2022    12:29 PM 08/03/2020   11:48 AM  Readmission Risk Prevention Plan  Transportation Screening Complete Complete  PCP or Specialist Appt within 3-5 Days Complete   HRI or Home Care Consult Complete Complete  Social Work Consult for Recovery Care Planning/Counseling Complete Complete  Palliative Care Screening Not Applicable Not Applicable  Medication Review Oceanographer) Referral to Pharmacy Complete

## 2022-11-10 LAB — SURGICAL PATHOLOGY

## 2022-11-10 LAB — AEROBIC/ANAEROBIC CULTURE W GRAM STAIN (SURGICAL/DEEP WOUND): Gram Stain: NONE SEEN

## 2022-11-10 MED ORDER — ZOLPIDEM TARTRATE 5 MG PO TABS
5.0000 mg | ORAL_TABLET | Freq: Every evening | ORAL | Status: DC | PRN
  Administered 2022-11-10: 5 mg via ORAL
  Filled 2022-11-10: qty 1

## 2022-11-10 MED ORDER — DOXYCYCLINE HYCLATE 100 MG PO TABS
100.0000 mg | ORAL_TABLET | Freq: Two times a day (BID) | ORAL | Status: DC
  Administered 2022-11-10 – 2022-11-11 (×2): 100 mg via ORAL
  Filled 2022-11-10 (×2): qty 1

## 2022-11-10 NOTE — Progress Notes (Signed)
Patient ID: Mark [name]., male   DOB: January 25, 1956, 67 y.o.   MRN: [id number] Examination this morning patient states he is still having difficulty sleeping.  He did have Ambien last night.  The wound VAC dressing has been pulled back and does not have a good seal.  Will have the dressing removed and a dry dressing applied.  Anticipate discharge in the next day or 2.  Patient does not want to go to inpatient rehab.  Tissue cultures within the femoral canal are negative.

## 2022-11-10 NOTE — Progress Notes (Signed)
Refused PNA vaccine.

## 2022-11-10 NOTE — Plan of Care (Addendum)
Removed wound vac dressing, incision looks good, scant bloody drainage, margins all attached, will continue to monitor, patient handle dressing removal well.  Problem: Activity: Goal: Ability to perform//tolerate increased activity and mobilize with assistive devices will improve Outcome: Progressing   Problem: Self-Care: Goal: Ability to meet self-care needs will improve Outcome: Progressing   Problem: Pain Management: Goal: Pain level will decrease with appropriate interventions Outcome: Progressing   Problem: Skin Integrity: Goal: Demonstration of wound healing without infection will improve Outcome: Progressing

## 2022-11-10 NOTE — Anesthesia Postprocedure Evaluation (Signed)
Anesthesia Post Note  Patient: Mark [name].  Procedure(s) Performed: RIGHT ABOVE KNEE AMPUTATION (Right: Knee)     Patient location during evaluation: PACU Anesthesia Type: Regional Level of consciousness: awake and alert Pain management: pain level controlled Vital Signs Assessment: post-procedure vital signs reviewed and stable Respiratory status: spontaneous breathing Cardiovascular status: stable Anesthetic complications: no   No notable events documented.  Last Vitals:  Vitals:   11/10/22 0443 11/10/22 0738  BP: (!) 158/64 (!) 156/62  Pulse: 71 80  Resp: 18   Temp: 37.2 C 36.9 C  SpO2: 100% 100%    Last Pain:  Vitals:   11/10/22 0850  TempSrc:   PainSc: 10-Worst pain ever                 Lewie Loron

## 2022-11-10 NOTE — Care Management Important Message (Signed)
Important Message  Patient Details  Name: Mark [name]. MRN: [id number] Date of Birth: 06-Mar-1956   Medicare Important Message Given:  Yes     Sherilyn Banker 11/10/2022, 3:57 PM

## 2022-11-10 NOTE — Progress Notes (Signed)
   11/10/22 0005  Provider Notification  Provider Name/Title duda, Berna Spare  Date Provider Notified 11/10/22  Time Provider Notified 0005  Method of Notification  (secure chat)  Notification Reason  (sleep aid request)  Provider response See new orders  Date of Provider Response 11/10/22  Time of Provider Response 0008   Pt states Ambien 5mg  did not help him rest at all. During rounds pt was found resting with eyes closed, but states he did not. Informed pt we will ensure this is addressed should he stay another night.

## 2022-11-11 MED ORDER — DOXYCYCLINE HYCLATE 100 MG PO TABS: 100.0000 mg | ORAL_TABLET | Freq: Two times a day (BID) | ORAL | 0 refills | Status: DC

## 2022-11-11 MED ORDER — HYDROCODONE-ACETAMINOPHEN 10-325 MG PO TABS: 1.0000 | ORAL_TABLET | ORAL | 0 refills | Status: DC | PRN

## 2022-11-11 NOTE — TOC Transition Note (Signed)
Transition of Care [facility]) - CM/SW Discharge Note   Patient Details  Name: Mark [name]. MRN: [id number] Date of Birth: [birth year]  Transition of Care [facility]) CM/SW Contact:  Epifanio Lesches, RN Phone Number: 11/11/2022, 10:16 AM   Clinical Narrative:    Patient will DC [employer] Anticipated DC date: 11/11/2022 Family notified: yes Transport by: car         S/P R AKA, 7/12 Per MD patient ready for DC today. RN, patient, patient's family, and [facility] notified of DC. Pt states wife /son  to assist with care once d/c. DME (W/C) will be delivered to bedside prior to d/c. Post hospital f/u noted on AVS. Pt without RX med concerns.Pt to pick up RX meds from local pharmacy.  Family  to provide transportation to home.  RNCM will sign off for now as intervention is no longer needed. Please consult [place] again if new needs arise.   Final next level of care: Home w Home Health Services Barriers to Discharge: No Barriers Identified   Patient Goals and CMS Choice      Discharge Placement                         Discharge Plan and Services Additional resources added to the After Visit Summary for                  DME Arranged: Lightweight manual wheelchair with seat cushion DME Agency: [employer] Date DME Agency Contacted: 11/09/22 Time DME Agency Contacted: 1308 Representative spoke with at DME Agency: Vaughan Basta HH Arranged: PT, OT HH Agency: [facility] Date [facility] Agency Contacted: 11/09/22 Time HH Agency Contacted: 1251 Representative spoke with at [facility] Agency: Kandee Keen  Social Determinants of Health (SDOH) Interventions SDOH Screenings   Food Insecurity: No Food Insecurity (11/09/2022)  Housing: Low Risk  (11/09/2022)  Transportation Needs: No Transportation Needs (11/09/2022)  Utilities: Not At Risk (11/09/2022)  Alcohol Screen: Low Risk  (03/14/2020)  Depression (PHQ2-9): Low Risk  (08/21/2022)  Financial Resource Strain: Low Risk  (05/16/2021)   Physical Activity: Sufficiently Active (05/16/2021)  Social Connections: Socially Integrated (05/16/2021)  Stress: No Stress Concern Present (05/16/2021)  Tobacco Use: Medium Risk (11/07/2022)     Readmission Risk Interventions    03/03/2022   12:29 PM 08/03/2020   11:48 AM  Readmission Risk Prevention Plan  Transportation Screening Complete Complete  PCP or Specialist Appt within 3-5 Days Complete   HRI or Home Care Consult Complete Complete  Social Work Consult for Recovery Care Planning/Counseling Complete Complete  Palliative Care Screening Not Applicable Not Applicable  Medication Review Oceanographer) Referral to Pharmacy Complete

## 2022-11-11 NOTE — Plan of Care (Signed)
  Problem: Education: Goal: Knowledge of the prescribed therapeutic regimen will improve Outcome: Progressing   Problem: Self-Care: Goal: Ability to meet self-care needs will improve Outcome: Progressing   Problem: Pain Management: Goal: Pain level will decrease with appropriate interventions Outcome: Progressing

## 2022-11-11 NOTE — Plan of Care (Signed)
Patient alert/oriented X4. Patient compliant with medication administration and PIV removed prior to discharge. VSS upon discharge and AVS instructions explained in detail. Patient belongings packed up at bedside. Will continue to monitor.  Problem: Education: Goal: Knowledge of the prescribed therapeutic regimen will improve Outcome: Adequate for Discharge   Problem: Education: Goal: Ability to verbalize activity precautions or restrictions will improve Outcome: Adequate for Discharge   Problem: Education: Goal: Understanding of discharge needs will improve Outcome: Adequate for Discharge   Problem: Activity: Goal: Ability to perform//tolerate increased activity and mobilize with assistive devices will improve Outcome: Adequate for Discharge   Problem: Clinical Measurements: Goal: Postoperative complications will be avoided or minimized Outcome: Adequate for Discharge   Problem: Self-Care: Goal: Ability to meet self-care needs will improve Outcome: Adequate for Discharge   Problem: Self-Concept: Goal: Ability to maintain and perform role responsibilities to the fullest extent possible will improve Outcome: Adequate for Discharge   Problem: Pain Management: Goal: Pain level will decrease with appropriate interventions Outcome: Adequate for Discharge   Problem: Skin Integrity: Goal: Demonstration of wound healing without infection will improve Outcome: Adequate for Discharge   Problem: Education: Goal: Knowledge of General Education information will improve Description: Including pain rating scale, medication(s)/side effects and non-pharmacologic comfort measures Outcome: Adequate for Discharge   Problem: Health Behavior/Discharge Planning: Goal: Ability to manage health-related needs will improve Outcome: Adequate for Discharge   Problem: Clinical Measurements: Goal: Ability to maintain clinical measurements within normal limits will improve Outcome: Adequate for  Discharge   Problem: Clinical Measurements: Goal: Will remain free from infection Outcome: Adequate for Discharge   Problem: Clinical Measurements: Goal: Diagnostic test results will improve Outcome: Adequate for Discharge   Problem: Clinical Measurements: Goal: Respiratory complications will improve Outcome: Adequate for Discharge   Problem: Clinical Measurements: Goal: Cardiovascular complication will be avoided Outcome: Adequate for Discharge   Problem: Activity: Goal: Risk for activity intolerance will decrease Outcome: Adequate for Discharge   Problem: Nutrition: Goal: Adequate nutrition will be maintained Outcome: Adequate for Discharge   Problem: Coping: Goal: Level of anxiety will decrease Outcome: Adequate for Discharge   Problem: Elimination: Goal: Will not experience complications related to bowel motility Outcome: Adequate for Discharge   Problem: Elimination: Goal: Will not experience complications related to urinary retention Outcome: Adequate for Discharge   Problem: Pain Managment: Goal: General experience of comfort will improve Outcome: Adequate for Discharge   Problem: Safety: Goal: Ability to remain free from injury will improve Outcome: Adequate for Discharge   Problem: Skin Integrity: Goal: Risk for impaired skin integrity will decrease Outcome: Adequate for Discharge

## 2022-11-11 NOTE — Progress Notes (Signed)
Physical Therapy Treatment Patient Details Name: Mark [name]. MRN: [id number] DOB: [birth year] Today's Date: 11/11/2022   History of Present Illness 67 y.o. male with a diagnosis of Infected Right Total Knee Arthroplasty who failed conservative measures and elected for surgical management.  He is now s/p R AKA 11/07/22. PMH of GERD, Arthritis, TIA, SIRS,    PT Comments  Focus of session education to amputee exercises, continued interaction including visualization and touching of residual limb, shrinker donning and stair training. With RW pt moves at a min guard level and requires only min A for steadying with ascend of steps backwards and descent forward with RW. Pt eager to discharge home this morning.      Assistance Recommended at Discharge Frequent or constant Supervision/Assistance  If plan is discharge home, recommend the following:  Can travel by private vehicle    A little help with walking and/or transfers;A little help with bathing/dressing/bathroom;Assistance with cooking/housework;Assist for transportation;Help with stairs or ramp for entrance      Equipment Recommendations  Wheelchair (measurements PT);Wheelchair cushion (measurements PT)    Recommendations for Other Services Other (comment) (chaplain consult)     Precautions / Restrictions Precautions Precautions: Fall Precaution Comments: change in CoG Restrictions Weight Bearing Restrictions: Yes RLE Weight Bearing: Non weight bearing     Mobility  Bed Mobility Overal bed mobility: Needs Assistance Bed Mobility: Sidelying to Sit   Sidelying to sit: Supervision       General bed mobility comments: supervision for safety    Transfers Overall transfer level: Needs assistance Equipment used: Rolling walker (2 wheels) Transfers: Sit to/from Stand, Bed to chair/wheelchair/BSC Sit to Stand: Min guard           General transfer comment: min guard for safety from bed surface and straight back  chair    Ambulation/Gait Ambulation/Gait assistance: Min assist, +2 safety/equipment Gait Distance (Feet): 30 Feet Assistive device: Rolling walker (2 wheels) Gait Pattern/deviations: Step-to pattern Gait velocity: WFL Gait velocity interpretation: <1.8 ft/sec, indicate of risk for recurrent falls   General Gait Details: min A progressing to min guard for hop to pattern with RW, attempted crutches but needs modA for steadying   Stairs Stairs: Yes Stairs assistance: Min assist Stair Management: No rails, Backwards, With walker Number of Stairs: 1 (x3) General stair comments: min A for steadying at waist with hopping backwards up step with UE support on RW       Balance Overall balance assessment: Needs assistance Sitting-balance support: Feet supported, Bilateral upper extremity supported Sitting balance-[name]: Fair Sitting balance - Comments: benefit from B UE support for steadying   Standing balance support: Bilateral upper extremity supported, During functional activity Standing balance-[name]: Poor Standing balance comment: requires UE support for standing balance                            Cognition Arousal/Alertness: Awake/alert Behavior During Therapy: WFL for tasks assessed/performed Overall Cognitive Status: Within Functional Limits for tasks assessed                                 General Comments: pt looking forward to going home        Exercises Amputee Exercises Hip Extension: AAROM, Right, Sidelying, 5 reps Hip Flexion/Marching: AAROM, Right, 10 reps, Supine (need assistance to overcome tremulousness)    General Comments General comments (skin integrity, edema, etc.): donned  shrinker in room however too large, instructed pt to ask for smaller shrinker at follow up appointment      Pertinent Vitals/Pain Pain Assessment Pain Assessment: No/denies pain     PT Goals (current goals can now be found in the care  plan section) Acute Rehab PT Goals PT Goal Formulation: With patient Time For Goal Achievement: 11/22/22 Potential to Achieve Goals: Fair Progress towards PT goals: Progressing toward goals    Frequency    Min 4X/week      PT Plan Current plan remains appropriate       AM-PAC PT "6 Clicks" Mobility   Outcome Measure  Help needed turning from your back to your side while in a flat bed without using bedrails?: None Help needed moving from lying on your back to sitting on the side of a flat bed without using bedrails?: A Little Help needed moving to and from a bed to a chair (including a wheelchair)?: A Little Help needed standing up from a chair using your arms (e.g., wheelchair or bedside chair)?: A Little Help needed to walk in hospital room?: A Little Help needed climbing 3-5 steps with a railing? : A Little 6 Click Score: 19    End of Session Equipment Utilized During Treatment: Gait belt Activity Tolerance: Patient tolerated treatment well Patient left: with call bell/phone within reach;in bed;with bed alarm set Nurse Communication: Mobility status PT Visit Diagnosis: Unsteadiness on feet (R26.81);Other abnormalities of gait and mobility (R26.89);Difficulty in walking, not elsewhere classified (R26.2);Other symptoms and signs involving the nervous system (R29.898);Pain Pain - Right/Left: Right Pain - part of body: Leg     Time: 1610-9604 PT Time Calculation (min) (ACUTE ONLY): 44 min  Charges:    $Gait Training: 8-22 mins $Therapeutic Exercise: 8-22 mins $Therapeutic Activity: 8-22 mins PT General Charges $$ ACUTE PT VISIT: 1 Visit                     Nakema Fake B. Beverely Risen PT, DPT Acute Rehabilitation Services Please use secure chat or  Call Office [phone]    Elon Alas [facility] 11/11/2022, 9:50 AM

## 2022-11-11 NOTE — Progress Notes (Signed)
Patient ID: Mark [name]., male   DOB: April 13, 1956, 67 y.o.   MRN: [id number] Patient is status post right above-knee amputation.  The dressing is clean and dry he states he is ready for discharge to home.  Will discharge on doxycycline and hydrocodone 10 mg tablets.  Patient states the Percocet does not work.

## 2022-11-11 NOTE — Discharge Summary (Signed)
Discharge Diagnoses:  Principal Problem:   Hx of AKA (above knee amputation), right ([name]) Active Problems:   Infection of total knee replacement ([name])   Surgeries: Procedure(s): RIGHT ABOVE KNEE AMPUTATION on 11/07/2022    Consultants:   Discharged Condition: Improved  Hospital Course: [place] Schnitker. is an 67 y.o. male who was admitted 11/07/2022 with a chief complaint of septic right total knee arthroplasty, with a final diagnosis of Infected Right Total Knee Arthroplasty.  Patient was brought to the operating room on 11/07/2022 and underwent Procedure(s): RIGHT ABOVE KNEE AMPUTATION.    Patient was given perioperative antibiotics:  Anti-infectives (From admission, onward)    Start     Dose/Rate Route Frequency Ordered Stop   11/11/22 0000  doxycycline (VIBRA-TABS) 100 MG tablet        100 mg Oral 2 times daily 11/11/22 0751     11/10/22 2200  doxycycline (VIBRA-TABS) tablet 100 mg        100 mg Oral Every 12 hours 11/10/22 1556     11/07/22 1900  ceFAZolin (ANCEF) IVPB 2g/100 mL premix        2 g 200 mL/hr over 30 Minutes Intravenous Every 8 hours 11/07/22 1812 11/09/22 0951   11/07/22 1333  vancomycin (VANCOCIN) powder  Status:  Discontinued          As needed 11/07/22 1333 11/07/22 1339   11/07/22 1100  ceFAZolin (ANCEF) IVPB 2g/100 mL premix        2 g 200 mL/hr over 30 Minutes Intravenous On call to O.R. 11/07/22 1051 11/07/22 1325     .  Patient was given sequential compression devices, early ambulation, and aspirin for DVT prophylaxis.  Recent vital signs: Patient Vitals for the past 24 hrs:  BP Temp Temp src Pulse Resp SpO2  11/11/22 0740 (!) 163/63 98.3 F (36.8 C) Oral 71 18 100 %  11/11/22 0515 (!) 169/78 98.2 F (36.8 C) Oral 79 18 100 %  11/10/22 2121 (!) 159/62 98.4 F (36.9 C) Oral 72 -- 99 %  11/10/22 1458 (!) 166/56 98.5 F (36.9 C) Oral 68 -- 100 %  .  Recent laboratory studies: No results found.  Discharge Medications:   Allergies as of  11/11/2022   No Known Allergies      Medication List     TAKE these medications    doxycycline 100 MG tablet Commonly known as: VIBRA-TABS Take 1 tablet (100 mg total) by mouth 2 (two) times daily.   ferrous sulfate 325 (65 FE) MG tablet Take 1 tablet (325 mg total) by mouth 2 (two) times daily with a meal. Take for two weeks as tolerated. What changed:  when to take this additional instructions   furosemide 20 MG tablet Commonly known as: LASIX TAKE 1 TABLET(20 MG) BY MOUTH TWICE DAILY   HYDROcodone-acetaminophen 10-325 MG tablet Commonly known as: NORCO Take 1 tablet by mouth every 4 (four) hours as needed for moderate pain.   naproxen sodium 220 MG tablet Commonly known as: ALEVE Take 440 mg by mouth daily as needed (pain).               Durable Medical Equipment  (From admission, onward)           Start     Ordered   11/09/22 1309  For home use only DME standard manual wheelchair with seat cushion  Once       Comments: Patient suffers from Right Above Knee Amputation which impairs their ability  to perform daily activities like bathing, dressing, feeding, grooming, and toileting in the home.  A walker will not resolve issue with performing activities of daily living. A wheelchair will allow patient to safely perform daily activities. Patient can safely propel the wheelchair in the home or has a caregiver who can provide assistance. Length of need Lifetime. Accessories: elevating leg rests (ELRs), wheel locks, extensions and anti-tippers.   11/09/22 1313            Diagnostic Studies: No results found.  Patient benefited maximally from their hospital stay and there were no complications.     Disposition: Discharge disposition: 01-Home or Self Care      Discharge Instructions     Call MD / Call 911   Complete by: As directed    If you experience chest pain or shortness of breath, CALL 911 and be transported to the hospital emergency room.  If  you develope a fever above 101 F, pus (white drainage) or increased drainage or redness at the wound, or calf pain, call your surgeon's office.   Constipation Prevention   Complete by: As directed    Drink plenty of fluids.  Prune juice may be helpful.  You may use a stool softener, such as Colace (over the counter) 100 mg twice a day.  Use MiraLax (over the counter) for constipation as needed.   Diet - low sodium heart healthy   Complete by: As directed    Increase activity slowly as tolerated   Complete by: As directed    Post-operative opioid taper instructions:   Complete by: As directed    POST-OPERATIVE OPIOID TAPER INSTRUCTIONS: It is important to wean off of your opioid medication as soon as possible. If you do not need pain medication after your surgery it is ok to stop day one. Opioids include: Codeine, Hydrocodone(Norco, Vicodin), Oxycodone(Percocet, oxycontin) and hydromorphone amongst others.  Long term and even short term use of opiods can cause: Increased pain response Dependence Constipation Depression Respiratory depression And more.  Withdrawal symptoms can include Flu like symptoms Nausea, vomiting And more Techniques to manage these symptoms Hydrate well Eat regular healthy meals Stay active Use relaxation techniques(deep breathing, meditating, yoga) Do Not substitute Alcohol to help with tapering If you have been on opioids for less than two weeks and do not have pain than it is ok to stop all together.  Plan to wean off of opioids This plan should start within one week post op of your joint replacement. Maintain the same interval or time between taking each dose and first decrease the dose.  Cut the total daily intake of opioids by one tablet each day Next start to increase the time between doses. The last dose that should be eliminated is the evening dose.          Follow-up Information     Nadara Mustard, MD Follow up in 1 week(s).   Specialty:  Orthopedic Surgery Contact information: [address] [place] [address] [phone]                  Signed: Nadara Mustard 11/11/2022, 7:52 AM

## 2022-11-11 NOTE — Progress Notes (Signed)
Occupational Therapy Treatment Patient Details Name: Mark [name]. MRN: [id number] DOB: Mar 19, 1956 Today's Date: 11/11/2022   History of present illness 67 y.o. male with a diagnosis of Infected Right Total Knee Arthroplasty who failed conservative measures and elected for surgical management.  He is now s/p R AKA 11/07/22. PMH of GERD, Arthritis, TIA, SIRS,   OT comments  Pt making good progress with functional goals. Pt min guard A to walk to bathroom with RW for toilet transfers, toileting tasks and LB dressing min guard A . Pt stood at sink to wash and dry hands min guard A. Pt eager to d/c home today   Recommendations for follow up therapy are one component of a multi-disciplinary discharge planning process, led by the attending physician.  Recommendations may be updated based on patient status, additional functional criteria and insurance authorization.    Assistance Recommended at Discharge Intermittent Supervision/Assistance  Patient can return home with the following  A little help with walking and/or transfers;A little help with bathing/dressing/bathroom;Assistance with cooking/housework;Assist for transportation;Help with stairs or ramp for entrance   Equipment Recommendations  Tub/shower bench    Recommendations for Other Services      Precautions / Restrictions Precautions Precautions: Fall Precaution Comments: change in CoG Restrictions Weight Bearing Restrictions: Yes RLE Weight Bearing: Non weight bearing       Mobility Bed Mobility               General bed mobility comments: pt in chair upon arrival    Transfers Overall transfer level: Needs assistance Equipment used: Rolling walker (2 wheels) Transfers: Sit to/from Stand, Bed to chair/wheelchair/BSC Sit to Stand: Min guard           General transfer comment: min guard for safety to walk to bathroom using RW     Balance Overall balance assessment: Needs assistance Sitting-balance  support: Feet supported, Bilateral upper extremity supported Sitting balance-[name]: Fair     Standing balance support: Bilateral upper extremity supported, During functional activity Standing balance-[name]: Poor Standing balance comment: requires UE support for standing balance                           ADL either performed or assessed with clinical judgement   ADL Overall ADL's : Needs assistance/impaired     Grooming: Wash/dry hands;Wash/dry face;Standing               Lower Body Dressing: Min guard;Sitting/lateral leans   Toilet Transfer: Min guard;Ambulation;Rolling walker (2 wheels);Grab bars   Toileting- Clothing Manipulation and Hygiene: Min guard;Sit to/from stand       Functional mobility during ADLs: Min guard;Rolling walker (2 wheels);Cueing for safety      Extremity/Trunk Assessment Upper Extremity Assessment Upper Extremity Assessment: Overall WFL for tasks assessed   Lower Extremity Assessment Lower Extremity Assessment: Defer to PT evaluation        Vision Ability to See in Adequate Light: 0 Adequate Patient Visual Report: No change from baseline     Perception     Praxis      Cognition Arousal/Alertness: Awake/alert Behavior During Therapy: WFL for tasks assessed/performed Overall Cognitive Status: Within Functional Limits for tasks assessed                                 General Comments: pt eager to d/c home        Exercises  Shoulder Instructions       General Comments donned shrinker in room however too large, instructed pt to ask for smaller shrinker at follow up appointment    Pertinent Vitals/ Pain       Pain Assessment Pain Assessment: No/denies pain  Home Living                                          Prior Functioning/Environment              Frequency  Min 1X/week        Progress Toward Goals  OT Goals(current goals can now be found in the  care plan section)  Progress towards OT goals: Progressing toward goals     Plan      Co-evaluation                 AM-PAC OT "6 Clicks" Daily Activity     Outcome Measure   Help from another person eating meals?: None Help from another person taking care of personal grooming?: A Little Help from another person toileting, which includes using toliet, bedpan, or urinal?: A Little Help from another person bathing (including washing, rinsing, drying)?: A Little Help from another person to put on and taking off regular upper body clothing?: None Help from another person to put on and taking off regular lower body clothing?: A Little 6 Click Score: 20    End of Session Equipment Utilized During Treatment: Gait belt;Rolling walker (2 wheels)  OT Visit Diagnosis: Unsteadiness on feet (R26.81);Other abnormalities of gait and mobility (R26.89);Other symptoms and signs involving cognitive function   Activity Tolerance Patient tolerated treatment well   Patient Left in bed;with call bell/phone within reach;Other (comment) (sitting EOB)   Nurse Communication          Time: 1610-9604 OT Time Calculation (min): 23 min  Charges: OT General Charges $OT Visit: 1 Visit OT Treatments $Self Care/Home Management : 8-22 mins $Therapeutic Activity: 8-22 mins    Margaretmary Eddy [facility] 11/11/2022, 10:04 AM

## 2022-11-12 ENCOUNTER — Telehealth: Payer: Self-pay

## 2022-11-12 LAB — AEROBIC/ANAEROBIC CULTURE W GRAM STAIN (SURGICAL/DEEP WOUND)

## 2022-11-12 NOTE — Telephone Encounter (Signed)
Pt is s/p an AKA and has been d/c from the hospital. Can you please call and make an appt for next week to f/u in office with Erin please?

## 2022-11-12 NOTE — Telephone Encounter (Signed)
Patient called stating that compression stocking, large size is way to big.  Would like to know if he could get a medium and a small?  Patient had right AKA on 11/07/2022.  CB# [phone].  Please advise.

## 2022-11-14 ENCOUNTER — Telehealth: Payer: Self-pay

## 2022-11-14 ENCOUNTER — Telehealth: Payer: Self-pay | Admitting: Family Medicine

## 2022-11-14 NOTE — Telephone Encounter (Signed)
This provider did not order PT.  Per chart review bp was elevated during hospitalization on 7/15 and 7/16.  Unable to tell if elevated prior to that upon quick review as progress notes during hospitalization do not include vs.  BP and HR likely elevated due to pain but must also consider infection.  If pt is having fever, chills, n/v, etc or continued bp elevation and tachycardia would advised to proceed to nearest ED to evaluate for infection, PE, etc..

## 2022-11-14 NOTE — Patient Outreach (Signed)
  Care Coordination   Follow Up Visit Note   11/14/2022 Name: Mark [name]. MRN: [id number] DOB: Jul 20, 1955  Mark [name]. is a 67 y.o. year old male who sees Deeann Saint, MD for primary care. I spoke with  Mark [name]. by phone today.  What matters to the patients health and wellness today?  Right AKA    Goals Addressed             This Visit's Progress    Right AKA       Patient Goals/Self Care Activities: -Patient/Caregiver will self-administer medications as prescribed as evidenced by self-report/primary caregiver report  -Patient/Caregiver will attend all scheduled provider appointments as evidenced by clinician review of documented attendance to scheduled appointments and patient/caregiver report -Patient/Caregiver will call provider office for new concerns or questions as evidenced by review of documented incoming telephone call notes and patient report   Patient had scheduled right AKA. Patient doing okay.  Discussed signs of infection and when to notify physician.  Patient has appointment with surgeon next week.  No transportation problems.  [facility] ordered but not started. Patient inquired about a wheelchair ramp. Advised I would schedule with social work to discuss resources for a wheelchair ramp.   Telephone call to [facility] about home health.  They do have the order and working on.          SDOH assessments and interventions completed:  Yes     Care Coordination Interventions:  Yes, provided   Follow up plan: Follow up call scheduled for August    Encounter Outcome:  Pt. Visit Completed   Bary Leriche, RN, MSN [facility] Care Management Care Management Coordinator Direct Line [phone]

## 2022-11-14 NOTE — Telephone Encounter (Signed)
Mark [name], PT for Frances Furbish [phone] Rip Harbour to leave a detailed message on this line  Pt was hospitalized and was discharged on 11/11/22 Right knee amputation Doctor:  Dr. Talbert Nan says Pt is not homebound, therefore no PT  Concerns: HR 115-120 at rest BP 170/90 Pain is 7 out of 10 Denies cardiac symptoms Dr. Audrie Lia office is aware HR & BP are most concerning, as per PT

## 2022-11-14 NOTE — Patient Instructions (Signed)
Visit Information  Thank you for taking time to visit with me today. Please don't hesitate to contact me if I can be of assistance to you.   Following are the goals we discussed today:   Goals Addressed             This Visit's Progress    Right AKA       Patient Goals/Self Care Activities: -Patient/Caregiver will self-administer medications as prescribed as evidenced by self-report/primary caregiver report  -Patient/Caregiver will attend all scheduled provider appointments as evidenced by clinician review of documented attendance to scheduled appointments and patient/caregiver report -Patient/Caregiver will call provider office for new concerns or questions as evidenced by review of documented incoming telephone call notes and patient report   Patient had scheduled right AKA. Patient doing okay.  Discussed signs of infection and when to notify physician.  Patient has appointment with surgeon next week.  No transportation problems.  [facility] ordered but not started. Patient inquired about a wheelchair ramp. Advised I would schedule with social work to discuss resources for a wheelchair ramp.   Telephone call to [facility] about home health.  They do have the order and working on.          Our next appointment is by telephone on 12/02/22 at 1030  Please call the care guide team at [phone] if you need to cancel or reschedule your appointment.   If you are experiencing a Mental Health or Behavioral Health Crisis or need someone to talk to, please call the Suicide and Crisis Lifeline: 988   The patient verbalized understanding of instructions, educational materials, and care plan provided today and DECLINED offer to receive copy of patient instructions, educational materials, and care plan.   The patient has been provided with contact information for the care management team and has been advised to call with any health related questions or concerns.   Bary Leriche, RN,  MSN [facility] Care Management Care Management Coordinator Direct Line [phone]

## 2022-11-14 NOTE — Telephone Encounter (Signed)
Spoke with Rosanne Ashing, states he was just wanting to update PCP with information. Information was also sent to Dr Lajoyce Corners.

## 2022-11-18 ENCOUNTER — Ambulatory Visit: Payer: Self-pay

## 2022-11-18 NOTE — Patient Instructions (Signed)
Visit Information  Thank you for taking time to visit with me today. Please don't hesitate to contact me if I can be of assistance to you.   Following are the goals we discussed today:  - Call [facility] at [phone] regarding a ramp - Call Social Security with questions about your income   Our next appointment is by telephone on 8/2 at 10:00 am  Please call the care guide team at [phone] if you need to cancel or reschedule your appointment.   If you are experiencing a Mental Health or Behavioral Health Crisis or need someone to talk to, please go to [facility] [address] [place] [phone]) call 911  The patient verbalized understanding of instructions, educational materials, and care plan provided today and DECLINED offer to receive copy of patient instructions, educational materials, and care plan.   Bevelyn Ngo, BSW, CDP Social Worker, Certified Dementia Practitioner [facility] Care Management  Care Coordination [phone]

## 2022-11-18 NOTE — Patient Outreach (Signed)
  Care Coordination   Follow Up Visit Note   11/18/2022 Name: Mark [name]. MRN: [id number] DOB: Jan 12, 1956  Mark [name]. is a 67 y.o. year old male who sees Deeann Saint, MD for primary care. I spoke with  Mark [name]. by phone today.  What matters to the patients health and wellness today?  Patient would like to obtain a wheelchair ramp    Goals Addressed             This Visit's Progress    Care Coordination Activities       Care Coordination Interventions: Discussed patient is in need of a wheelchair ramp for his porch; patient reports he does not own his home but is renting and feels his landlord would be ok with a ramp being built Education provided on the resource [employer] [facility] "[employer]" advising the patient they will build ramps for renters as long as the landlord is in agreement Provided the patient with the contact number to [facility] advising him to call in order to start the referral process - patient is aware he will receive a packet in the mail to complete Determined the patient would like information on if his social security can be increased due to recent surgical procedure; advised the patient to contact the social security office with this inquiry         SDOH assessments and interventions completed:  No     Care Coordination Interventions:  Yes, provided   Interventions Today    Flowsheet Row Most Recent Value  Chronic Disease   Chronic disease during today's visit Other  [BKA, needs w/c ramp]  General Interventions   General Interventions Discussed/Reviewed General Interventions Discussed, Psychiatrist Interventions   Education Provided Provided Education  Provided Engineer, [employer]        Follow up plan: Follow up call scheduled for 8/2    Encounter Outcome:  Pt. Visit Completed   Bevelyn Ngo, Kenard Gower, CDP Social Worker, Certified Dementia Practitioner [facility] Care  Management  Care Coordination [phone]

## 2022-11-19 ENCOUNTER — Encounter: Payer: Self-pay | Admitting: Family

## 2022-11-19 ENCOUNTER — Ambulatory Visit (INDEPENDENT_AMBULATORY_CARE_PROVIDER_SITE_OTHER): Payer: Medicare HMO | Admitting: Family

## 2022-11-19 DIAGNOSIS — Z89611 Acquired absence of right leg above knee: Secondary | ICD-10-CM

## 2022-11-19 NOTE — Progress Notes (Signed)
Post-Op Visit Note   Patient: Mark [name].           Date of Birth: 18-Feb-1956           MRN: [id number] Visit Date: 11/19/2022 PCP: Deeann Saint, MD  Chief Complaint:  Chief Complaint  Patient presents with   Right Leg - Routine Post Op    11/07/22 Right AKA    HPI:  HPI The patient is a 67 year old gentleman seen status post above-knee amputation on the right. Reports his shrinkers are much to large and ill fitting. Ortho Exam On examination of right residual limb suture are in place. There is no gaping or drainage.   Visit Diagnoses: No diagnosis found.  Plan: Daily Dial soap cleansing.  Dry dressings.  Harvested a few sutures today.  We will hold off on remaining sutures until next week.  Follow-up in 1 week for suture removal  Follow-Up Instructions: No follow-ups on file.   Imaging: No results found.  Orders:  No orders of the defined types were placed in this encounter.  No orders of the defined types were placed in this encounter.    PMFS History: Patient Active Problem List   Diagnosis Date Noted   Infection of total knee replacement ([name]) 11/07/2022   Hx of AKA (above knee amputation), right ([name]) 11/07/2022   Right knee pain 07/08/2022   Cerebrovascular disease 07/08/2022   Renal cyst 07/08/2022   Protein-calorie malnutrition, severe 07/08/2022   Toxic metabolic encephalopathy 02/26/2022   Septic joint of right knee joint ([name]) 11/08/2020   S/P right TKA reimplantation 08/02/2020   Infection of total left knee replacement ([name]) 03/20/2020   Cirrhosis of liver without ascites ([name])    AKI (acute kidney injury) ([name])    Bacteremia    Spinal stenosis of lumbar region without neurogenic claudication    Aspiration pneumonia ([name]) 09/24/2018   Abscess in epidural space of lumbar spine    MRSA bacteremia 09/21/2018   Infection of prosthetic right knee joint ([name]) 09/20/2018   Anemia of chronic disease 09/20/2018   Hypoalbuminemia 09/20/2018    Hypoglycemia without diagnosis of diabetes mellitus 09/20/2018   Epidural abscess 09/20/2018   Hyperkalemia 05/18/2018   Edentulous 05/11/2018   Pancytopenia ([name]) 05/10/2018   CAD (coronary artery disease) 05/10/2018   Hepatic encephalopathy ([name]) 05/10/2018   Alcohol abuse 05/10/2018   GERD (gastroesophageal reflux disease) 05/10/2018   Duodenal ulcer    Renal failure    Acute on chronic anemia    Hypotension 06/17/2017   Hyponatremia 06/17/2017   Leg edema, right 06/17/2017   Acute metabolic encephalopathy 06/17/2017   Anemia, iron deficiency    Benign neoplasm of ascending colon    Hemorrhoids    Portal hypertensive gastropathy ([name])    Gastritis and gastroduodenitis    Esophageal varices without bleeding ([name])    H/O: CVA (cerebrovascular accident) 12/01/2013   ACS (acute coronary syndrome) ([name]) 12/01/2013   Anasarca 12/01/2013   Polysubstance abuse ([name]) 12/01/2013   CKD (chronic kidney disease) stage 3, GFR 30-59 ml/min ([name]) 12/01/2013   S/P right TKA 05/02/2013   Murmur 04/14/2013   Right inguinal hernia 12/26/2010   Cirrhosis, alcoholic ([name]) 12/20/2010   Past Medical History:  Diagnosis Date   Alcohol abuse    Anemia    Arthritis    Ascites    Cirrhosis ([name])    Coffee ground emesis    Dehydration 06/17/2017   Febrile illness    GERD (gastroesophageal reflux disease)  Heart murmur    History of blood transfusion    Hyperlipidemia    Hypertension    Leg swelling    Myocardial infarction ([name]) 2012   Preop cardiovascular exam 04/14/2013   Sepsis ([name]) 06/17/2017   Septic shock ([name]) 06/18/2017   SIRS (systemic inflammatory response syndrome) ([name]) 07/11/2017   Stroke ([name]) 10/2009   TIA   Thrombocytopenia ([name])     Family History  Problem Relation Age of Onset   Hypertension Father    Diabetes Father    Dementia Mother    Lupus Sister     Past Surgical History:  Procedure Laterality Date   AMPUTATION Right 11/07/2022   Procedure: RIGHT  ABOVE KNEE AMPUTATION;  Surgeon: Nadara Mustard, MD;  Location: [facility] OR;  Service: Orthopedics;  Laterality: Right;   COLONOSCOPY WITH PROPOFOL N/A 02/07/2016   Procedure: COLONOSCOPY WITH PROPOFOL;  Surgeon: Rachael Fee, MD;  Location: [facility] ENDOSCOPY;  Service: Endoscopy;  Laterality: N/A;   ESOPHAGOGASTRODUODENOSCOPY (EGD) WITH PROPOFOL N/A 02/07/2016   Procedure: ESOPHAGOGASTRODUODENOSCOPY (EGD) WITH PROPOFOL;  Surgeon: Rachael Fee, MD;  Location: [facility];  Service: Endoscopy;  Laterality: N/A;   ESOPHAGOGASTRODUODENOSCOPY (EGD) WITH PROPOFOL N/A 06/22/2017   Procedure: ESOPHAGOGASTRODUODENOSCOPY (EGD) WITH PROPOFOL;  Surgeon: Beverley Fiedler, MD;  Location[facility] ENDOSCOPY;  Service: Gastroenterology;  Laterality: N/A;   EXCISIONAL TOTAL KNEE ARTHROPLASTY WITH ANTIBIOTIC SPACERS Right 03/20/2020   Procedure: Resection right total knee arthroplasty and placement of antibiotic spacer;  Surgeon: Durene Romans, MD;  Location: [facility] ORS;  Service: Orthopedics;  Laterality: Right;  90 mins   HERNIA REPAIR Right    inguinal   INGUINAL HERNIA REPAIR Left 02/08/2021   Procedure: OPEN LEFT INGUINAL HERNIA REPAIR WITH MESH;  Surgeon: Abigail Miyamoto, MD;  Location: [facility] OR;  Service: General;  Laterality: Left;   IRRIGATION AND DEBRIDEMENT KNEE Right 11/13/2020   Procedure: IRRIGATION AND DEBRIDEMENT KNEE;  Surgeon: Durene Romans, MD;  Location: [facility] ORS;  Service: Orthopedics;  Laterality: Right;   JOINT REPLACEMENT     KNEE ARTHROSCOPY     bilateral/  12/14   REIMPLANTATION OF TOTAL KNEE Right 08/02/2020   Procedure: REIMPLANTATION/REVISION OF TOTAL KNEE WITH REMOVAL OF ANTIBIOTIC SPACER;  Surgeon: Durene Romans, MD;  Location: [facility] ORS;  Service: Orthopedics;  Laterality: Right;    TEE WITHOUT CARDIOVERSION N/A 05/13/2018   Procedure: TRANSESOPHAGEAL ECHOCARDIOGRAM (TEE);  Surgeon: Parke Poisson, MD;  Location: [facility] ENDOSCOPY;  Service: Cardiovascular;  Laterality: N/A;   TOTAL KNEE  ARTHROPLASTY Right 05/02/2013   Procedure: RIGHT TOTAL KNEE ARTHROPLASTY;  Surgeon: Shelda Pal, MD;  Location: [facility] ORS;  Service: Orthopedics;  Laterality: Right;   Social History   Occupational History   Occupation: Maintenance    Employer: [employer]  Tobacco Use   Smoking status: Former    Current packs/day: 0.00    Average packs/day: 0.5 packs/day for 10.0 years (5.0 ttl pk-yrs)    Types: Cigarettes    Start date: 01/04/2006    Quit date: 01/05/2016    Years since quitting: 6.8   Smokeless tobacco: Never   Tobacco comments:    Pt reports he quit smoke 3 months ago. 07/11/22-km,cma  Vaping Use   Vaping status: Never Used  Substance and Sexual Activity   Alcohol use: Yes    Comment: 40 ounces a day   Drug use: Yes    Types: Marijuana    Comment: occasionally   Sexual activity: Not Currently  Comment: now and then smokies marijuana

## 2022-11-20 ENCOUNTER — Telehealth: Payer: Self-pay | Admitting: Orthopedic Surgery

## 2022-11-20 NOTE — Telephone Encounter (Signed)
Pt called in stating he is running out of Hydrocodones (will be out by the weekend) and they are too much for him and he would like the Perc 5mg  instead please advise send to [facility] on [address]

## 2022-11-21 MED ORDER — HYDROCODONE-ACETAMINOPHEN 5-325 MG PO TABS: 1.0000 | ORAL_TABLET | ORAL | 0 refills | Status: DC | PRN

## 2022-11-21 NOTE — Addendum Note (Signed)
Addended by: Adonis Huguenin on: 11/21/2022 08:34 AM   Modules accepted: Orders

## 2022-11-24 ENCOUNTER — Encounter (HOSPITAL_COMMUNITY): Payer: Self-pay

## 2022-11-24 ENCOUNTER — Other Ambulatory Visit: Payer: Self-pay

## 2022-11-24 ENCOUNTER — Emergency Department (HOSPITAL_COMMUNITY): Payer: Medicare HMO

## 2022-11-24 ENCOUNTER — Emergency Department (HOSPITAL_COMMUNITY)
Admission: EM | Admit: 2022-11-24 | Discharge: 2022-11-25 | Discharge status: Against Medical Advice | Payer: Medicare HMO | Attending: Emergency Medicine | Admitting: Emergency Medicine

## 2022-11-24 DIAGNOSIS — Z5321 Procedure and treatment not carried out due to patient leaving prior to being seen by health care provider: Secondary | ICD-10-CM | POA: Insufficient documentation

## 2022-11-24 DIAGNOSIS — R102 Pelvic and perineal pain: Secondary | ICD-10-CM | POA: Insufficient documentation

## 2022-11-24 DIAGNOSIS — M160 Bilateral primary osteoarthritis of hip: Secondary | ICD-10-CM | POA: Diagnosis not present

## 2022-11-24 DIAGNOSIS — W010XXA Fall on same level from slipping, tripping and stumbling without subsequent striking against object, initial encounter: Secondary | ICD-10-CM | POA: Diagnosis not present

## 2022-11-24 DIAGNOSIS — M25551 Pain in right hip: Secondary | ICD-10-CM | POA: Diagnosis not present
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
## 2022-11-25 ENCOUNTER — Telehealth: Payer: Self-pay | Admitting: Family Medicine

## 2022-11-25 ENCOUNTER — Telehealth: Payer: Self-pay | Admitting: Orthopedic Surgery

## 2022-11-25 NOTE — Telephone Encounter (Signed)
Pt called stating he fell and went to the ER, pt had XRAY done, but did not stay for the results, pt would like Dr Salomon Fick to get the results, pt has scheduled an appt for 11/26/2022 at 11:30am

## 2022-11-25 NOTE — ED Notes (Signed)
Pt seen the ED with family

## 2022-11-25 NOTE — Telephone Encounter (Signed)
Patient called needing Rx refilled. Patient said he want to step down dosage of the medication. Patient asked for 5 mg Rx. Patient said he fell yesterday and his right leg  is hurting again. The number to contact patient is [phone]

## 2022-11-26 ENCOUNTER — Encounter: Payer: Self-pay | Admitting: Family Medicine

## 2022-11-26 ENCOUNTER — Ambulatory Visit (INDEPENDENT_AMBULATORY_CARE_PROVIDER_SITE_OTHER): Payer: Medicare HMO | Admitting: Family Medicine

## 2022-11-26 VITALS — BP 104/60 | HR 94 | Temp 98.5°F | Wt 118.6 lb

## 2022-11-26 DIAGNOSIS — Z89611 Acquired absence of right leg above knee: Secondary | ICD-10-CM | POA: Diagnosis not present

## 2022-11-26 DIAGNOSIS — G547 Phantom limb syndrome without pain: Secondary | ICD-10-CM
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
## 2022-11-26 NOTE — Telephone Encounter (Signed)
Pt has an appt tomorrow will hold and discuss at appt.

## 2022-11-27 ENCOUNTER — Telehealth: Payer: Self-pay | Admitting: Orthopedic Surgery

## 2022-11-27 ENCOUNTER — Ambulatory Visit (INDEPENDENT_AMBULATORY_CARE_PROVIDER_SITE_OTHER): Payer: Medicare HMO | Admitting: Orthopedic Surgery

## 2022-11-27 DIAGNOSIS — Z89611 Acquired absence of right leg above knee: Secondary | ICD-10-CM

## 2022-11-27 NOTE — Telephone Encounter (Signed)
Patient called advised the sleeve he got is to long and he  said he would like to pick up a shorter one.  Patient said the sleeve is 20/30 MMHG extra long. Size V  The number to contact patient is [phone]

## 2022-11-28 ENCOUNTER — Telehealth: Payer: Self-pay

## 2022-11-28 NOTE — Telephone Encounter (Signed)
I called pt and he states that he will call [facility] and make appt to pick up correct size. To call with any questions.

## 2022-11-28 NOTE — Patient Outreach (Signed)
  Care Coordination   11/28/2022 Name: Mark [name]. MRN: [id number] DOB: November 30, 1955   Care Coordination Outreach Attempts:  An unsuccessful telephone outreach was attempted for a scheduled appointment today.  Follow Up Plan:  Additional outreach attempts will be made to offer the patient care coordination information and services.   Encounter Outcome:  No Answer   Care Coordination Interventions:  No, not indicated    Bevelyn Ngo, BSW, CDP Social Worker, Certified Dementia Practitioner [facility] Management  Care Coordination [phone]

## 2022-12-02 ENCOUNTER — Telehealth: Payer: Self-pay

## 2022-12-02 ENCOUNTER — Ambulatory Visit: Payer: Self-pay

## 2022-12-02 NOTE — Patient Outreach (Signed)
  Care Coordination   12/02/2022 Name: Mark [name]. MRN: [id number] DOB: 1955/10/15   Care Coordination Outreach Attempts:  An unsuccessful telephone outreach was attempted today to offer the patient information about available care coordination services.  Follow Up Plan:  Additional outreach attempts will be made to offer the patient care coordination information and services.   Encounter Outcome:  No Answer   Care Coordination Interventions:  No, not indicated    Bary Leriche, RN, MSN [facility] Care Management Care Management Coordinator Direct Line [phone]

## 2022-12-02 NOTE — Telephone Encounter (Signed)
Transition Care Management Unsuccessful Follow-up Telephone Call  Date of discharge and from where:  [facility] 7/30  Attempts:  1st Attempt  Reason for unsuccessful TCM follow-up call:  No answer/busy   Lenard Forth [facility] Guide, [facility] [phone] [address] [place], [place], [place] [address] Phone: [phone] Email: [email or web address]

## 2022-12-03 ENCOUNTER — Telehealth: Payer: Self-pay

## 2022-12-03 NOTE — Telephone Encounter (Signed)
Transition Care Management Follow-up Telephone Call Date of discharge and from where: [facility] 7/30 How have you been since you were released from the hospital? Doing ok Any questions or concerns? No  Items Reviewed: Did the pt receive and understand the discharge instructions provided? Yes  Medications obtained and verified? No  Other? No  Any new allergies since your discharge? No  Dietary orders reviewed? No Do you have support at home? Yes     Follow up appointments reviewed:  PCP Hospital f/u appt confirmed? Yes  Scheduled to see  on  @ . Specialist Hospital f/u appt confirmed? Yes  Scheduled to see  on  @ . Are transportation arrangements needed? Yes  If their condition worsens, is the pt aware to call PCP or go to the Emergency Dept.? Yes Was the patient provided with contact information for the PCP's office or ED? Yes Was to pt encouraged to call back with questions or concerns? Yes   Lenard Forth [facility] Guide, [facility] [phone] [address] [place], [place], [place] [address] Phone: [phone] Email: [email or web address]

## 2022-12-05 ENCOUNTER — Telehealth: Payer: Self-pay

## 2022-12-05 NOTE — Patient Instructions (Signed)
Visit Information  Thank you for taking time to visit with me today. Please don't hesitate to contact me if I can be of assistance to you.   Following are the goals we discussed today:   Goals Addressed             This Visit's Progress    Right AKA       Patient Goals/Self Care Activities: -Patient/Caregiver will self-administer medications as prescribed as evidenced by self-report/primary caregiver report  -Patient/Caregiver will attend all scheduled provider appointments as evidenced by clinician review of documented attendance to scheduled appointments and patient/caregiver report -Patient/Caregiver will call provider office for new concerns or questions as evidenced by review of documented incoming telephone call notes and patient report   Patient had scheduled right AKA. Patient doing okay.  Patient reports wound doing surprisingly well and that he is wearing a stump shrinker and has an appointment with [facility] next week. No concerns.         Our next appointment is by telephone on 01/06/23 at 1000 am  Please call the care guide team at [phone] if you need to cancel or reschedule your appointment.   If you are experiencing a Mental Health or Behavioral Health Crisis or need someone to talk to, please call the Suicide and Crisis Lifeline: 988   Patient verbalizes understanding of instructions and care plan provided today and agrees to view in MyChart. Active MyChart status and patient understanding of how to access instructions and care plan via MyChart confirmed with patient.     The patient has been provided with contact information for the care management team and has been advised to call with any health related questions or concerns.   Bary Leriche, RN, MSN [facility] Care Management Care Management Coordinator Direct Line [phone]

## 2022-12-05 NOTE — Patient Outreach (Signed)
  Care Coordination   Follow Up Visit Note   12/05/2022 Name: Jaxson Weisman. MRN: [id number] DOB: [birth year]  [name]. is a 67 y.o. year old male who sees Deeann Saint, MD for primary care. I spoke with  [name]. by phone today.  What matters to the patients health and wellness today?  Getting through healing process of Right AKA    Goals Addressed             This Visit's Progress    Right AKA       Patient Goals/Self Care Activities: -Patient/Caregiver will self-administer medications as prescribed as evidenced by self-report/primary caregiver report  -Patient/Caregiver will attend all scheduled provider appointments as evidenced by clinician review of documented attendance to scheduled appointments and patient/caregiver report -Patient/Caregiver will call provider office for new concerns or questions as evidenced by review of documented incoming telephone call notes and patient report   Patient had scheduled right AKA. Patient doing okay.  Patient reports wound doing surprisingly well and that he is wearing a stump shrinker and has an appointment with [facility] next week. No concerns.         SDOH assessments and interventions completed:  Yes     Care Coordination Interventions:  Yes, provided   Follow up plan: Follow up call scheduled for September    Encounter Outcome:  Pt. Visit Completed   Bary Leriche, RN, MSN [facility] Care Management Care Management Coordinator Direct Line [phone]

## 2022-12-11 ENCOUNTER — Encounter: Payer: Self-pay | Admitting: Orthopedic Surgery

## 2022-12-11 ENCOUNTER — Ambulatory Visit (INDEPENDENT_AMBULATORY_CARE_PROVIDER_SITE_OTHER): Payer: Medicare HMO | Admitting: Orthopedic Surgery

## 2022-12-11 DIAGNOSIS — Z89611 Acquired absence of right leg above knee: Secondary | ICD-10-CM

## 2022-12-11 MED ORDER — OXYCODONE-ACETAMINOPHEN 5-325 MG PO TABS: 1.0000 | ORAL_TABLET | ORAL | 0 refills | Status: DC | PRN

## 2022-12-11 NOTE — Progress Notes (Signed)
Office Visit Note   Patient: Mark [name].           Date of Birth: 1955-06-21           MRN: [id number] Visit Date: 12/11/2022              Requested by: Deeann Saint, MD [address] [place],  [place] [address] PCP: Deeann Saint, MD  Chief Complaint  Patient presents with   Right Leg - Routine Post Op    11/07/22 right AKA      HPI: Patient is a 67 year old gentleman who is status post right above-knee amputation he is 4 weeks out.  Patient has recently fallen on his residual limb complains of increased pain distally.  Assessment & Plan: Visit Diagnoses:  1. Hx of AKA (above knee amputation), right ([name])     Plan: Prescription was provided for a K3 prosthesis on the right.  Prescription for Percocet for the increased pain.  Follow-Up Instructions: Return in about 4 weeks (around 01/08/2023).   Ortho Exam  Patient is alert, oriented, no adenopathy, well-dressed, normal affect, normal respiratory effort. Examination of the wound is well-healed.  There is good consult patient there is limb.  No cellulitis no drainage.  Patient is very active he drives he cuts wood for his house he is active with grandchildren.  Patient will benefit from a K3 level prosthesis with a hydraulic knee.  Most likely would not be able to manage a microprocessor knee.  Patient is a new right transfemoral amputee.  Patient's current comorbidities are not expected to impact the ability to function with the prescribed prosthesis. Patient verbally communicates a strong desire to use a prosthesis. Patient currently requires mobility aids to ambulate without a prosthesis.  Expects not to use mobility aids with a new prosthesis.  Patient is a K3 level ambulator that spends a lot of time walking around on uneven terrain over obstacles, up and down stairs, and ambulates with a variable cadence.     Imaging: No results found. No images are attached to the encounte.  Labs: Lab  Results  Component Value Date   HGBA1C  11/01/2009    5.1 (NOTE)                                                                       According to the ADA Clinical Practice Recommendations for 2011, when HbA1c is used as a screening test:   >=6.5%   Diagnostic of Diabetes Mellitus           (if abnormal result  is confirmed)  5.7-6.4%   Increased risk of developing Diabetes Mellitus  References:Diagnosis and Classification of Diabetes Mellitus,Diabetes Care,2011,34(Suppl 1):S62-S69 and Standards of Medical Care in         Diabetes - 2011,Diabetes Care,2011,34  (Suppl 1):S11-S61.   ESRSEDRATE 130 (H) 07/07/2022   ESRSEDRATE >130 (H) 04/16/2022   ESRSEDRATE >140 (H) 11/08/2020   CRP 3.7 (H) 07/07/2022   CRP 12.8 (H) 04/16/2022   CRP 3.6 (H) 11/08/2020   REPTSTATUS 11/12/2022 FINAL 11/07/2022   GRAMSTAIN NO WBC SEEN NO ORGANISMS SEEN  11/07/2022   CULT  11/07/2022    RARE METHICILLIN RESISTANT STAPHYLOCOCCUS AUREUS NO ANAEROBES ISOLATED  Performed at [facility], [address] [place], [place] [address]    LABORGA METHICILLIN RESISTANT STAPHYLOCOCCUS AUREUS 11/07/2022     Lab Results  Component Value Date   ALBUMIN 3.0 (L) 11/07/2022   ALBUMIN 3.1 (L) 07/08/2022   ALBUMIN 3.3 (L) 07/07/2022   PREALBUMIN 12 (L) 11/07/2022   PREALBUMIN <5 (L) 09/21/2018   PREALBUMIN <5 (L) 07/11/2017    Lab Results  Component Value Date   MG 1.9 11/11/2020   MG 1.6 (L) 10/05/2018   MG 1.6 (L) 10/04/2018   Lab Results  Component Value Date   VD25OH 15 (L) 06/29/2014   VD25OH 10 (L) 12/01/2013    Lab Results  Component Value Date   PREALBUMIN 12 (L) 11/07/2022   PREALBUMIN <5 (L) 09/21/2018   PREALBUMIN <5 (L) 07/11/2017      Latest Ref Rng & Units 11/07/2022   11:27 AM 07/11/2022    2:59 PM 07/08/2022    1:21 AM  CBC EXTENDED  WBC 4.0 - 10.5 K/uL 3.4  3.7  3.6   RBC 4.22 - 5.81 MIL/uL 2.81  3.52  2.81   Hemoglobin 13.0 - 17.0 g/dL 9.0  84.1  8.6   HCT 32.4 - 52.0 % 27.8   32.5  27.7   Platelets 150 - 400 K/uL 167  190  170   NEUT# 1.7 - 7.7 K/uL 1.3  1,869    Lymph# 0.7 - 4.0 K/uL 1.2  1,162       There is no height or weight on file to calculate BMI.  Orders:  No orders of the defined types were placed in this encounter.  Meds ordered this encounter  Medications   oxyCODONE-acetaminophen (PERCOCET/ROXICET) 5-325 MG tablet    Sig: Take 1 tablet by mouth every 4 (four) hours as needed for severe pain.    Dispense:  20 tablet    Refill:  0     Procedures: No procedures performed  Clinical Data: No additional findings.  ROS:  All other systems negative, except as noted in the HPI. Review of Systems  Objective: Vital Signs: There were no vitals taken for this visit.  Specialty Comments:  No specialty comments available.  PMFS History: Patient Active Problem List   Diagnosis Date Noted   Infection of total knee replacement ([name]) 11/07/2022   Hx of AKA (above knee amputation), right ([name]) 11/07/2022   Right knee pain 07/08/2022   Cerebrovascular disease 07/08/2022   Renal cyst 07/08/2022   Protein-calorie malnutrition, severe 07/08/2022   Toxic metabolic encephalopathy 02/26/2022   Septic joint of right knee joint ([name]) 11/08/2020   S/P right TKA reimplantation 08/02/2020   Infection of total left knee replacement ([name]) 03/20/2020   Cirrhosis of liver without ascites ([name])    AKI (acute kidney injury) ([name])    Bacteremia    Spinal stenosis of lumbar region without neurogenic claudication    Aspiration pneumonia ([name]) 09/24/2018   Abscess in epidural space of lumbar spine    MRSA bacteremia 09/21/2018   Infection of prosthetic right knee joint ([name]) 09/20/2018   Anemia of chronic disease 09/20/2018   Hypoalbuminemia 09/20/2018   Hypoglycemia without diagnosis of diabetes mellitus 09/20/2018   Epidural abscess 09/20/2018   Hyperkalemia 05/18/2018   Edentulous 05/11/2018   Pancytopenia ([name]) 05/10/2018   CAD (coronary artery  disease) 05/10/2018   Hepatic encephalopathy ([name]) 05/10/2018   Alcohol abuse 05/10/2018   GERD (gastroesophageal reflux disease) 05/10/2018   Duodenal ulcer    Renal  failure    Acute on chronic anemia    Hypotension 06/17/2017   Hyponatremia 06/17/2017   Leg edema, right 06/17/2017   Acute metabolic encephalopathy 06/17/2017   Anemia, iron deficiency    Benign neoplasm of ascending colon    Hemorrhoids    Portal hypertensive gastropathy ([name])    Gastritis and gastroduodenitis    Esophageal varices without bleeding ([name])    H/O: CVA (cerebrovascular accident) 12/01/2013   ACS (acute coronary syndrome) ([name]) 12/01/2013   Anasarca 12/01/2013   Polysubstance abuse ([name]) 12/01/2013   CKD (chronic kidney disease) stage 3, GFR 30-59 ml/min ([name]) 12/01/2013   S/P right TKA 05/02/2013   Murmur 04/14/2013   Right inguinal hernia 12/26/2010   Cirrhosis, alcoholic ([name]) 12/20/2010   Past Medical History:  Diagnosis Date   Alcohol abuse    Anemia    Arthritis    Ascites    Cirrhosis ([name])    Coffee ground emesis    Dehydration 06/17/2017   Febrile illness    GERD (gastroesophageal reflux disease)    Heart murmur    History of blood transfusion    Hyperlipidemia    Hypertension    Leg swelling    Myocardial infarction ([name]) 2012   Preop cardiovascular exam 04/14/2013   Sepsis ([name]) 06/17/2017   Septic shock ([name]) 06/18/2017   SIRS (systemic inflammatory response syndrome) ([name]) 07/11/2017   Stroke ([name]) 10/2009   TIA   Thrombocytopenia ([name])     Family History  Problem Relation Age of Onset   Hypertension Father    Diabetes Father    Dementia Mother    Lupus Sister     Past Surgical History:  Procedure Laterality Date   AMPUTATION Right 11/07/2022   Procedure: RIGHT ABOVE KNEE AMPUTATION;  Surgeon: Nadara Mustard, MD;  Location: [facility] OR;  Service: Orthopedics;  Laterality: Right;   COLONOSCOPY WITH PROPOFOL N/A 02/07/2016   Procedure: COLONOSCOPY WITH PROPOFOL;   Surgeon: Rachael Fee, MD;  Location: [facility] ENDOSCOPY;  Service: Endoscopy;  Laterality: N/A;   ESOPHAGOGASTRODUODENOSCOPY (EGD) WITH PROPOFOL N/A 02/07/2016   Procedure: ESOPHAGOGASTRODUODENOSCOPY (EGD) WITH PROPOFOL;  Surgeon: Rachael Fee, MD;  Location: [facility];  Service: Endoscopy;  Laterality: N/A;   ESOPHAGOGASTRODUODENOSCOPY (EGD) WITH PROPOFOL N/A 06/22/2017   Procedure: ESOPHAGOGASTRODUODENOSCOPY (EGD) WITH PROPOFOL;  Surgeon: Beverley Fiedler, MD;  Location: [facility] ENDOSCOPY;  Service: Gastroenterology;  Laterality: N/A;   EXCISIONAL TOTAL KNEE ARTHROPLASTY WITH ANTIBIOTIC SPACERS Right 03/20/2020   Procedure: Resection right total knee arthroplasty and placement of antibiotic spacer;  Surgeon: Durene Romans, MD;  Location: [facility] ORS;  Service: Orthopedics;  Laterality: Right;  90 mins   HERNIA REPAIR Right    inguinal   INGUINAL HERNIA REPAIR Left 02/08/2021   Procedure: OPEN LEFT INGUINAL HERNIA REPAIR WITH MESH;  Surgeon: Abigail Miyamoto, MD;  Location: [facility] OR;  Service: General;  Laterality: Left;   IRRIGATION AND DEBRIDEMENT KNEE Right 11/13/2020   Procedure: IRRIGATION AND DEBRIDEMENT KNEE;  Surgeon: Durene Romans, MD;  Location: [facility]S;  Service: Orthopedics;  Laterality: Right;   JOINT REPLACEMENT     KNEE ARTHROSCOPY     bilateral/  12/14   REIMPLANTATION OF TOTAL KNEE Right 08/02/2020   Procedure: REIMPLANTATION/REVISION OF TOTAL KNEE WITH REMOVAL OF ANTIBIOTIC SPACER;  Surgeon: Durene Romans, MD;  Location: [facility];  Service: Orthopedics;  Laterality: Right;    TEE WITHOUT CARDIOVERSION N/A 05/13/2018   Procedure: TRANSESOPHAGEAL ECHOCARDIOGRAM (TEE);  Surgeon: Parke Poisson, MD;  Location: [facility]  ENDOSCOPY;  Service: Cardiovascular;  Laterality: N/A;   TOTAL KNEE ARTHROPLASTY Right 05/02/2013   Procedure: RIGHT TOTAL KNEE ARTHROPLASTY;  Surgeon: Shelda Pal, MD;  Location: [facility]S;  Service: Orthopedics;  Laterality: Right;   Social History   Occupational  History   Occupation: Maintenance    Employer: [employer]  Tobacco Use   Smoking status: Former    Current packs/day: 0.00    Average packs/day: 0.5 packs/day for 10.0 years (5.0 ttl pk-yrs)    Types: Cigarettes    Start date: 01/04/2006    Quit date: 01/05/2016    Years since quitting: 6.9   Smokeless tobacco: Never   Tobacco comments:    Pt reports he quit smoke 3 months ago. 07/11/22-km,cma  Vaping Use   Vaping status: Never Used  Substance and Sexual Activity   Alcohol use: Yes    Comment: 40 ounces a day   Drug use: Yes    Types: Marijuana    Comment: occasionally   Sexual activity: Not Currently    Comment: now and then smokies marijuana

## 2022-12-11 NOTE — Progress Notes (Signed)
Office Visit Note   Patient: Mark [name].           Date of Birth: [birth year]           MRN: [id number] Visit Date: 11/27/2022              Requested by: Deeann Saint, MD [address] [place],  [place] [address] PCP: Deeann Saint, MD  Chief Complaint  Patient presents with   Right Leg - Routine Post Op    11/07/22 right AKA      HPI: Patient is a 67 year old gentleman who is 2 weeks status post right above-knee amputation.  Patient states he fell on the scat bus on a wet floor.  Assessment & Plan: Visit Diagnoses:  1. Hx of AKA (above knee amputation), right ([name])     Plan: Follow-up in 2 weeks to remove the sutures.  Patient will stay on the doxycycline.  Follow-Up Instructions: Return in about 2 weeks (around 12/11/2022).   Ortho Exam  Patient is alert, oriented, no adenopathy, well-dressed, normal affect, normal respiratory effort. Examination the wound is healing well there is no drainage no cellulitis.  Imaging: No results found.   Labs: Lab Results  Component Value Date   HGBA1C  11/01/2009    5.1 (NOTE)                                                                       According to the ADA Clinical Practice Recommendations for 2011, when HbA1c is used as a screening test:   >=6.5%   Diagnostic of Diabetes Mellitus           (if abnormal result  is confirmed)  5.7-6.4%   Increased risk of developing Diabetes Mellitus  References:Diagnosis and Classification of Diabetes Mellitus,Diabetes Care,2011,34(Suppl 1):S62-S69 and Standards of Medical Care in         Diabetes - 2011,Diabetes Care,2011,34  (Suppl 1):S11-S61.   ESRSEDRATE 130 (H) 07/07/2022   ESRSEDRATE >130 (H) 04/16/2022   ESRSEDRATE >140 (H) 11/08/2020   CRP 3.7 (H) 07/07/2022   CRP 12.8 (H) 04/16/2022   CRP 3.6 (H) 11/08/2020   REPTSTATUS 11/12/2022 FINAL 11/07/2022   GRAMSTAIN NO WBC SEEN NO ORGANISMS SEEN  11/07/2022   CULT  11/07/2022    RARE METHICILLIN RESISTANT  STAPHYLOCOCCUS AUREUS NO ANAEROBES ISOLATED Performed at [facility], [address], [place], [place] [address]    LABORGA METHICILLIN RESISTANT STAPHYLOCOCCUS AUREUS 11/07/2022     Lab Results  Component Value Date   ALBUMIN 3.0 (L) 11/07/2022   ALBUMIN 3.1 (L) 07/08/2022   ALBUMIN 3.3 (L) 07/07/2022   PREALBUMIN 12 (L) 11/07/2022   PREALBUMIN <5 (L) 09/21/2018   PREALBUMIN <5 (L) 07/11/2017    Lab Results  Component Value Date   MG 1.9 11/11/2020   MG 1.6 (L) 10/05/2018   MG 1.6 (L) 10/04/2018   Lab Results  Component Value Date   VD25OH 15 (L) 06/29/2014   VD25OH 10 (L) 12/01/2013    Lab Results  Component Value Date   PREALBUMIN 12 (L) 11/07/2022   PREALBUMIN <5 (L) 09/21/2018   PREALBUMIN <5 (L) 07/11/2017      Latest Ref Rng & Units 11/07/2022  11:27 AM 07/11/2022    2:59 PM 07/08/2022    1:21 AM  CBC EXTENDED  WBC 4.0 - 10.5 K/uL 3.4  3.7  3.6   RBC 4.22 - 5.81 MIL/uL 2.81  3.52  2.81   Hemoglobin 13.0 - 17.0 g/dL 9.0  95.2  8.6   HCT 84.1 - 52.0 % 27.8  32.5  27.7   Platelets 150 - 400 K/uL 167  190  170   NEUT# 1.7 - 7.7 K/uL 1.3  1,869    Lymph# 0.7 - 4.0 K/uL 1.2  1,162       There is no height or weight on file to calculate BMI.  Orders:  No orders of the defined types were placed in this encounter.  No orders of the defined types were placed in this encounter.    Procedures: No procedures performed  Clinical Data: No additional findings.  ROS:  All other systems negative, except as noted in the HPI. Review of Systems  Objective: Vital Signs: There were no vitals taken for this visit.  Specialty Comments:  No specialty comments available.  PMFS History: Patient Active Problem List   Diagnosis Date Noted   Infection of total knee replacement ([name]) 11/07/2022   Hx of AKA (above knee amputation), right ([name]) 11/07/2022   Right knee pain 07/08/2022   Cerebrovascular disease 07/08/2022   Renal cyst 07/08/2022    Protein-calorie malnutrition, severe 07/08/2022   Toxic metabolic encephalopathy 02/26/2022   Septic joint of right knee joint ([name]) 11/08/2020   S/P right TKA reimplantation 08/02/2020   Infection of total left knee replacement ([name]) 03/20/2020   Cirrhosis of liver without ascites ([name])    AKI (acute kidney injury) ([name])    Bacteremia    Spinal stenosis of lumbar region without neurogenic claudication    Aspiration pneumonia ([name]) 09/24/2018   Abscess in epidural space of lumbar spine    MRSA bacteremia 09/21/2018   Infection of prosthetic right knee joint ([name]) 09/20/2018   Anemia of chronic disease 09/20/2018   Hypoalbuminemia 09/20/2018   Hypoglycemia without diagnosis of diabetes mellitus 09/20/2018   Epidural abscess 09/20/2018   Hyperkalemia 05/18/2018   Edentulous 05/11/2018   Pancytopenia ([name]) 05/10/2018   CAD (coronary artery disease) 05/10/2018   Hepatic encephalopathy ([name]) 05/10/2018   Alcohol abuse 05/10/2018   GERD (gastroesophageal reflux disease) 05/10/2018   Duodenal ulcer    Renal failure    Acute on chronic anemia    Hypotension 06/17/2017   Hyponatremia 06/17/2017   Leg edema, right 06/17/2017   Acute metabolic encephalopathy 06/17/2017   Anemia, iron deficiency    Benign neoplasm of ascending colon    Hemorrhoids    Portal hypertensive gastropathy ([name])    Gastritis and gastroduodenitis    Esophageal varices without bleeding ([name])    H/O: CVA (cerebrovascular accident) 12/01/2013   ACS (acute coronary syndrome) ([name]) 12/01/2013   Anasarca 12/01/2013   Polysubstance abuse ([name]) 12/01/2013   CKD (chronic kidney disease) stage 3, GFR 30-59 ml/min ([name]) 12/01/2013   S/P right TKA 05/02/2013   Murmur 04/14/2013   Right inguinal hernia 12/26/2010   Cirrhosis, alcoholic ([name]) 12/20/2010   Past Medical History:  Diagnosis Date   Alcohol abuse    Anemia    Arthritis    Ascites    Cirrhosis ([name])    Coffee ground emesis    Dehydration 06/17/2017    Febrile illness    GERD (gastroesophageal reflux disease)    Heart murmur    History  of blood transfusion    Hyperlipidemia    Hypertension    Leg swelling    Myocardial infarction ([name]) 2012   Preop cardiovascular exam 04/14/2013   Sepsis ([name]) 06/17/2017   Septic shock ([name]) 06/18/2017   SIRS (systemic inflammatory response syndrome) ([name]) 07/11/2017   Stroke ([name]) 10/2009   TIA   Thrombocytopenia ([name])     Family History  Problem Relation Age of Onset   Hypertension Father    Diabetes Father    Dementia Mother    Lupus Sister     Past Surgical History:  Procedure Laterality Date   AMPUTATION Right 11/07/2022   Procedure: RIGHT ABOVE KNEE AMPUTATION;  Surgeon: Nadara Mustard, MD;  Location: [facility] OR;  Service: Orthopedics;  Laterality: Right;   COLONOSCOPY WITH PROPOFOL N/A 02/07/2016   Procedure: COLONOSCOPY WITH PROPOFOL;  Surgeon: Rachael Fee, MD;  Location: [facility];  Service: Endoscopy;  Laterality: N/A;   ESOPHAGOGASTRODUODENOSCOPY (EGD) WITH PROPOFOL N/A 02/07/2016   Procedure: ESOPHAGOGASTRODUODENOSCOPY (EGD) WITH PROPOFOL;  Surgeon: Rachael Fee, MD;  Location: [facility];  Service: Endoscopy;  Laterality: N/A;   ESOPHAGOGASTRODUODENOSCOPY (EGD) WITH PROPOFOL N/A 06/22/2017   Procedure: ESOPHAGOGASTRODUODENOSCOPY (EGD) WITH PROPOFOL;  Surgeon: Beverley Fiedler, MD;  Location: [facility] ENDOSCOPY;  Service: Gastroenterology;  Laterality: N/A;   EXCISIONAL TOTAL KNEE ARTHROPLASTY WITH ANTIBIOTIC SPACERS Right 03/20/2020   Procedure: Resection right total knee arthroplasty and placement of antibiotic spacer;  Surgeon: Durene Romans, MD;  Location: [facility];  Service: Orthopedics;  Laterality: Right;  90 mins   HERNIA REPAIR Right    inguinal   INGUINAL HERNIA REPAIR Left 02/08/2021   Procedure: OPEN LEFT INGUINAL HERNIA REPAIR WITH MESH;  Surgeon: Abigail Miyamoto, MD;  Location: [facility] OR;  Service: General;  Laterality: Left;   IRRIGATION AND DEBRIDEMENT KNEE Right  11/13/2020   Procedure: IRRIGATION AND DEBRIDEMENT KNEE;  Surgeon: Durene Romans, MD;  Location: [facility];  Service: Orthopedics;  Laterality: Right;   JOINT REPLACEMENT     KNEE ARTHROSCOPY     bilateral/  12/14   REIMPLANTATION OF TOTAL KNEE Right 08/02/2020   Procedure: REIMPLANTATION/REVISION OF TOTAL KNEE WITH REMOVAL OF ANTIBIOTIC SPACER;  Surgeon: Durene Romans, MD;  Location: [facility];  Service: Orthopedics;  Laterality: Right;    TEE WITHOUT CARDIOVERSION N/A 05/13/2018   Procedure: TRANSESOPHAGEAL ECHOCARDIOGRAM (TEE);  Surgeon: Parke Poisson, MD;  Location: [facility] ENDOSCOPY;  Service: Cardiovascular;  Laterality: N/A;   TOTAL KNEE ARTHROPLASTY Right 05/02/2013   Procedure: RIGHT TOTAL KNEE ARTHROPLASTY;  Surgeon: Shelda Pal, MD;  Location: [facility];  Service: Orthopedics;  Laterality: Right;   Social History   Occupational History   Occupation: Maintenance    Employer: [employer]  Tobacco Use   Smoking status: Former    Current packs/day: 0.00    Average packs/day: 0.5 packs/day for 10.0 years (5.0 ttl pk-yrs)    Types: Cigarettes    Start date: 01/04/2006    Quit date: 01/05/2016    Years since quitting: 6.9   Smokeless tobacco: Never   Tobacco comments:    Pt reports he quit smoke 3 months ago. 07/11/22-km,cma  Vaping Use   Vaping status: Never Used  Substance and Sexual Activity   Alcohol use: Yes    Comment: 40 ounces a day   Drug use: Yes    Types: Marijuana    Comment: occasionally   Sexual activity: Not Currently    Comment: now and then smokies marijuana

## 2022-12-16 ENCOUNTER — Ambulatory Visit: Payer: Self-pay

## 2022-12-16 NOTE — Patient Outreach (Signed)
  Care Coordination   Follow Up Visit Note   12/16/2022 Name: Mark [name]. MRN: [id number] DOB: 08-26-1955  Mark [name]. is a 67 y.o. year old male who sees Deeann Saint, MD for primary care. I spoke with  Mark [name]. by phone today.  What matters to the patients health and wellness today?  Obtain a wheelchair ramp    Goals Addressed             This Visit's Progress    COMPLETED: Care Coordination Activities       Care Coordination Interventions: Determined the patient has yet to call [facility] [employer] regarding a wheelchair ramp as he lost the number Provided the patient with the contact number encouraging him to call today in order to start the process to obtain a ramp Assessed for ongoing SW needs - patient declined ongoing SW engagement  Encouraged the patient to contact SW as needed        SDOH assessments and interventions completed:  No     Care Coordination Interventions:  Yes, provided   Interventions Today    Flowsheet Row Most Recent Value  Chronic Disease   Chronic disease during today's visit Other  [AKA]  General Interventions   General Interventions Discussed/Reviewed General Interventions Reviewed, [facility]  [Provided patient with contact number to [facility]]        Follow up plan: No further intervention required.   Encounter Outcome:  Pt. Visit Completed   Bevelyn Ngo, BSW, CDP Social Worker, Certified Dementia Practitioner [facility] Care Management  Care Coordination [phone]

## 2022-12-16 NOTE — Patient Instructions (Signed)
Visit Information  Thank you for taking time to visit with me today. Please don't hesitate to contact me if I can be of assistance to you.   Following are the goals we discussed today:  - Call [facility] at [phone]  If you are experiencing a Mental Health or Behavioral Health Crisis or need someone to talk to, please call 1-800-273-TALK (toll free, 24 hour hotline) go to [facility] [address], [place] [phone]) call 911  The patient verbalized understanding of instructions, educational materials, and care plan provided today and DECLINED offer to receive copy of patient instructions, educational materials, and care plan.   No further follow up required: Please contact me as needed  Bevelyn Ngo, BSW, CDP Social Worker, Certified Dementia Practitioner [facility] Care Management  Care Coordination [phone]

## 2023-01-06 ENCOUNTER — Telehealth: Payer: Self-pay

## 2023-01-06 ENCOUNTER — Ambulatory Visit: Payer: Self-pay

## 2023-01-06 NOTE — Patient Outreach (Signed)
  Care Coordination   01/06/2023 Name: Mark [name]. MRN: [id number] DOB: [birth year]   Care Coordination Outreach Attempts:  An unsuccessful telephone outreach was attempted today to offer the patient information about available care coordination services.  Follow Up Plan:  Additional outreach attempts will be made to offer the patient care coordination information and services.   Encounter Outcome:  No Answer   Care Coordination Interventions:  No, not indicated    Bary Leriche, RN, MSN [facility]  [facility] Management Community Coordinator Direct Dial: [phone]  Fax: [phone] Website: [email or web address]

## 2023-01-06 NOTE — Patient Instructions (Signed)
Visit Information  Thank you for taking time to visit with me today. Please don't hesitate to contact me if I can be of assistance to you.   Following are the goals we discussed today:   Goals Addressed             This Visit's Progress    Right AKA       Patient Goals/Self Care Activities: -Patient/Caregiver will self-administer medications as prescribed as evidenced by self-report/primary caregiver report  -Patient/Caregiver will attend all scheduled provider appointments as evidenced by clinician review of documented attendance to scheduled appointments and patient/caregiver report -Patient/Caregiver will call provider office for new concerns or questions as evidenced by review of documented incoming telephone call notes and patient report   Patient  continues to heal from right AKA.  He is wearing shrinker.  Has appointment for follow up on 01/15/23/possible prothesis fitting.  Discussed phantom limb pain and coping with amputation.  He verbalized understanding.  Patient had a [place] [employer] [place] endorses some soreness still but no damage to amputation site. No concerns.          Our next appointment is by telephone on 02/03/23 at 1200 pm  Please call the care guide team at [phone] if you need to cancel or reschedule your appointment.   If you are experiencing a Mental Health or Behavioral Health Crisis or need someone to talk to, please call the Suicide and Crisis Lifeline: 988   The patient verbalized understanding of instructions, educational materials, and care plan provided today and DECLINED offer to receive copy of patient instructions, educational materials, and care plan.   The patient has been provided with contact information for the care management team and has been advised to call with any health related questions or concerns.   Bary Leriche, RN, MSN [facility], [facility] Management Community Coordinator Direct  Dial: [phone]  Fax: [phone] Website: [email or web address]

## 2023-01-06 NOTE — Patient Outreach (Signed)
  Care Coordination   Follow Up Visit Note   01/06/2023 Name: Mark [name]. MRN: [id number] DOB: May 07, 1955  Mark [name]. is a 67 y.o. year old male who sees Deeann Saint, MD for primary care. I spoke with  Mark [name]. by phone today.  What matters to the patients health and wellness today?  Accepting amputation.    Goals Addressed             This Visit's Progress    Right AKA       Patient Goals/Self Care Activities: -Patient/Caregiver will self-administer medications as prescribed as evidenced by self-report/primary caregiver report  -Patient/Caregiver will attend all scheduled provider appointments as evidenced by clinician review of documented attendance to scheduled appointments and patient/caregiver report -Patient/Caregiver will call provider office for new concerns or questions as evidenced by review of documented incoming telephone call notes and patient report   Patient  continues to heal from right AKA.  He is wearing shrinker.  Has appointment for follow up on 01/15/23/possible prothesis fitting.  Discussed phantom limb pain and coping with amputation.  He verbalized understanding.  Patient had a fall [place] the [employer] [place] endorses some soreness still but no damage to amputation site. No concerns.          SDOH assessments and interventions completed:  Yes     Care Coordination Interventions:  Yes, provided   Follow up plan: Follow up call scheduled for October    Encounter Outcome:  Patient Visit Completed   Bary Leriche, RN, MSN [place]  [facility], [facility] Management Community Coordinator Direct Dial: [phone]  Fax: [phone] Website: [email or web address]

## 2023-01-15 ENCOUNTER — Ambulatory Visit: Payer: Medicare HMO | Admitting: Orthopedic Surgery

## 2023-01-15 ENCOUNTER — Ambulatory Visit (INDEPENDENT_AMBULATORY_CARE_PROVIDER_SITE_OTHER): Payer: Medicare HMO | Admitting: Orthopedic Surgery

## 2023-01-15 DIAGNOSIS — Z89611 Acquired absence of right leg above knee: Secondary | ICD-10-CM

## 2023-01-19 ENCOUNTER — Encounter: Payer: Self-pay | Admitting: Orthopedic Surgery

## 2023-01-19 NOTE — Progress Notes (Signed)
Office Visit Note   Patient: Mark [name].           Date of Birth: [birth year]           MRN: [id number] Visit Date: 01/15/2023              Requested by: Deeann Saint, MD [address] [place],  [place] [address] PCP: Deeann Saint, MD  Chief Complaint  Patient presents with   Right Leg - Routine Post Op    11/07/22 right AKA      HPI: Patient is a 67 year old gentleman who is 2 months status post right above-knee amputation.  Patient was provided a prescription for K3 level prosthesis at the last office visit.  He will be fit on Monday.  Patient states he recently slipped on a [employer] bus and has pain in the right hip from the fall.  Radiographs were negative for fractures.  Assessment & Plan: Visit Diagnoses:  1. Hx of AKA (above knee amputation), right ([name])     Plan: Patient's right hip was evaluated.  Patient may increase activities as tolerated begin therapy after prosthesis provided.  Follow-Up Instructions: Return in about 2 months (around 03/17/2023).   Ortho Exam  Patient is alert, oriented, no adenopathy, well-dressed, normal affect, normal respiratory effort. Examination the right above-knee amputation is well-healed.  The right hip pain should not limit patient's activities with gait training.  Radiographs were negative for fracture.  Imaging: No results found. No images are attached to the encounter.  Labs: Lab Results  Component Value Date   HGBA1C  11/01/2009    5.1 (NOTE)                                                                       According to the ADA Clinical Practice Recommendations for 2011, when HbA1c is used as a screening test:   >=6.5%   Diagnostic of Diabetes Mellitus           (if abnormal result  is confirmed)  5.7-6.4%   Increased risk of developing Diabetes Mellitus  References:Diagnosis and Classification of Diabetes Mellitus,Diabetes Care,2011,34(Suppl 1):S62-S69 and Standards of Medical Care in         Diabetes -  2011,Diabetes Care,2011,34  (Suppl 1):S11-S61.   ESRSEDRATE 130 (H) 07/07/2022   ESRSEDRATE >130 (H) 04/16/2022   ESRSEDRATE >140 (H) 11/08/2020   CRP 3.7 (H) 07/07/2022   CRP 12.8 (H) 04/16/2022   CRP 3.6 (H) 11/08/2020   REPTSTATUS 11/12/2022 FINAL 11/07/2022   GRAMSTAIN NO WBC SEEN NO ORGANISMS SEEN  11/07/2022   CULT  11/07/2022    RARE METHICILLIN RESISTANT STAPHYLOCOCCUS AUREUS NO ANAEROBES ISOLATED Performed at [facility], [address], [place], [place] [address]    LABORGA METHICILLIN RESISTANT STAPHYLOCOCCUS AUREUS 11/07/2022     Lab Results  Component Value Date   ALBUMIN 3.0 (L) 11/07/2022   ALBUMIN 3.1 (L) 07/08/2022   ALBUMIN 3.3 (L) 07/07/2022   PREALBUMIN 12 (L) 11/07/2022   PREALBUMIN <5 (L) 09/21/2018   PREALBUMIN <5 (L) 07/11/2017    Lab Results  Component Value Date   MG 1.9 11/11/2020   MG 1.6 (L) 10/05/2018   MG 1.6 (L) 10/04/2018  Lab Results  Component Value Date   VD25OH 15 (L) 06/29/2014   VD25OH 10 (L) 12/01/2013    Lab Results  Component Value Date   PREALBUMIN 12 (L) 11/07/2022   PREALBUMIN <5 (L) 09/21/2018   PREALBUMIN <5 (L) 07/11/2017      Latest Ref Rng & Units 11/07/2022   11:27 AM 07/11/2022    2:59 PM 07/08/2022    1:21 AM  CBC EXTENDED  WBC 4.0 - 10.5 K/uL 3.4  3.7  3.6   RBC 4.22 - 5.81 MIL/uL 2.81  3.52  2.81   Hemoglobin 13.0 - 17.0 g/dL 9.0  81.1  8.6   HCT 91.4 - 52.0 % 27.8  32.5  27.7   Platelets 150 - 400 K/uL 167  190  170   NEUT# 1.7 - 7.7 K/uL 1.3  1,869    Lymph# 0.7 - 4.0 K/uL 1.2  1,162       There is no height or weight on file to calculate BMI.  Orders:  No orders of the defined types were placed in this encounter.  No orders of the defined types were placed in this encounter.    Procedures: No procedures performed  Clinical Data: No additional findings.  ROS:  All other systems negative, except as noted in the HPI. Review of Systems  Objective: Vital Signs: There were no  vitals taken for this visit.  Specialty Comments:  No specialty comments available.  PMFS History: Patient Active Problem List   Diagnosis Date Noted   Infection of total knee replacement ([name]) 11/07/2022   Hx of AKA (above knee amputation), right ([name]) 11/07/2022   Right knee pain 07/08/2022   Cerebrovascular disease 07/08/2022   Renal cyst 07/08/2022   Protein-calorie malnutrition, severe 07/08/2022   Toxic metabolic encephalopathy 02/26/2022   Septic joint of right knee joint ([name]) 11/08/2020   S/P right TKA reimplantation 08/02/2020   Infection of total left knee replacement ([name]) 03/20/2020   Cirrhosis of liver without ascites ([name])    AKI (acute kidney injury) ([name])    Bacteremia    Spinal stenosis of lumbar region without neurogenic claudication    Aspiration pneumonia ([name]) 09/24/2018   Abscess in epidural space of lumbar spine    MRSA bacteremia 09/21/2018   Infection of prosthetic right knee joint ([name]) 09/20/2018   Anemia of chronic disease 09/20/2018   Hypoalbuminemia 09/20/2018   Hypoglycemia without diagnosis of diabetes mellitus 09/20/2018   Epidural abscess 09/20/2018   Hyperkalemia 05/18/2018   Edentulous 05/11/2018   Pancytopenia ([name]) 05/10/2018   CAD (coronary artery disease) 05/10/2018   Hepatic encephalopathy ([name]) 05/10/2018   Alcohol abuse 05/10/2018   GERD (gastroesophageal reflux disease) 05/10/2018   Duodenal ulcer    Renal failure    Acute on chronic anemia    Hypotension 06/17/2017   Hyponatremia 06/17/2017   Leg edema, right 06/17/2017   Acute metabolic encephalopathy 06/17/2017   Anemia, iron deficiency    Benign neoplasm of ascending colon    Hemorrhoids    Portal hypertensive gastropathy ([name])    Gastritis and gastroduodenitis    Esophageal varices without bleeding ([name])    H/O: CVA (cerebrovascular accident) 12/01/2013   ACS (acute coronary syndrome) ([name]) 12/01/2013   Anasarca 12/01/2013   Polysubstance abuse ([name]) 12/01/2013    CKD (chronic kidney disease) stage 3, GFR 30-59 ml/min ([name]) 12/01/2013   S/P right TKA 05/02/2013   Murmur 04/14/2013   Right inguinal hernia 12/26/2010   Cirrhosis, alcoholic ([name]) 12/20/2010  Past Medical History:  Diagnosis Date   Alcohol abuse    Anemia    Arthritis    Ascites    Cirrhosis ([name])    Coffee ground emesis    Dehydration 06/17/2017   Febrile illness    GERD (gastroesophageal reflux disease)    Heart murmur    History of blood transfusion    Hyperlipidemia    Hypertension    Leg swelling    Myocardial infarction ([name]) 2012   Preop cardiovascular exam 04/14/2013   Sepsis ([name]) 06/17/2017   Septic shock ([name]) 06/18/2017   SIRS (systemic inflammatory response syndrome) ([name]) 07/11/2017   Stroke ([name]) 10/2009   TIA   Thrombocytopenia ([name])     Family History  Problem Relation Age of Onset   Hypertension Father    Diabetes Father    Dementia Mother    Lupus Sister     Past Surgical History:  Procedure Laterality Date   AMPUTATION Right 11/07/2022   Procedure: RIGHT ABOVE KNEE AMPUTATION;  Surgeon: Nadara Mustard, MD;  Location: [facility] OR;  Service: Orthopedics;  Laterality: Right;   COLONOSCOPY WITH PROPOFOL N/A 02/07/2016   Procedure: COLONOSCOPY WITH PROPOFOL;  Surgeon: Rachael Fee, MD;  Location: [facility] ENDOSCOPY;  Service: Endoscopy;  Laterality: N/A;   ESOPHAGOGASTRODUODENOSCOPY (EGD) WITH PROPOFOL N/A 02/07/2016   Procedure: ESOPHAGOGASTRODUODENOSCOPY (EGD) WITH PROPOFOL;  Surgeon: Rachael Fee, MD;  Location: [facility] ENDOSCOPY;  Service: Endoscopy;  Laterality: N/A;   ESOPHAGOGASTRODUODENOSCOPY (EGD) WITH PROPOFOL N/A 06/22/2017   Procedure: ESOPHAGOGASTRODUODENOSCOPY (EGD) WITH PROPOFOL;  Surgeon: Beverley Fiedler, MD;  Location: [facility] ENDOSCOPY;  Service: Gastroenterology;  Laterality: N/A;   EXCISIONAL TOTAL KNEE ARTHROPLASTY WITH ANTIBIOTIC SPACERS Right 03/20/2020   Procedure: Resection right total knee arthroplasty and placement of antibiotic spacer;   Surgeon: Durene Romans, MD;  Location: [facility] ORS;  Service: Orthopedics;  Laterality: Right;  90 mins   HERNIA REPAIR Right    inguinal   INGUINAL HERNIA REPAIR Left 02/08/2021   Procedure: OPEN LEFT INGUINAL HERNIA REPAIR WITH MESH;  Surgeon: Abigail Miyamoto, MD;  Location: [facility] OR;  Service: General;  Laterality: Left;   IRRIGATION AND DEBRIDEMENT KNEE Right 11/13/2020   Procedure: IRRIGATION AND DEBRIDEMENT KNEE;  Surgeon: Durene Romans, MD;  Location: [facility] ORS;  Service: Orthopedics;  Laterality: Right;   JOINT REPLACEMENT     KNEE ARTHROSCOPY     bilateral/  12/14   REIMPLANTATION OF TOTAL KNEE Right 08/02/2020   Procedure: REIMPLANTATION/REVISION OF TOTAL KNEE WITH REMOVAL OF ANTIBIOTIC SPACER;  Surgeon: Durene Romans, MD;  Location: [facility] ORS;  Service: Orthopedics;  Laterality: Right;    TEE WITHOUT CARDIOVERSION N/A 05/13/2018   Procedure: TRANSESOPHAGEAL ECHOCARDIOGRAM (TEE);  Surgeon: Parke Poisson, MD;  [facility] ENDOSCOPY;  Service: Cardiovascular;  Laterality: N/A;   TOTAL KNEE ARTHROPLASTY Right 05/02/2013   Procedure: RIGHT TOTAL KNEE ARTHROPLASTY;  Surgeon: Shelda Pal, MD;  Location: [facility] ORS;  Service: Orthopedics;  Laterality: Right;   Social History   Occupational History   Occupation: Maintenance    Employer: [employer]  Tobacco Use   Smoking status: Former    Current packs/day: 0.00    Average packs/day: 0.5 packs/day for 10.0 years (5.0 ttl pk-yrs)    Types: Cigarettes    Start date: 01/04/2006    Quit date: 01/05/2016    Years since quitting: 7.0   Smokeless tobacco: Never   Tobacco comments:    Pt reports he quit smoke 3 months ago. 07/11/22-km,cma  Vaping Use   Vaping status: Never Used  Substance and Sexual Activity   Alcohol use: Yes    Comment: 40 ounces a day   Drug use: Yes    Types: Marijuana    Comment: occasionally   Sexual activity: Not Currently    Comment: now and then smokies marijuana

## 2023-02-02 ENCOUNTER — Other Ambulatory Visit: Payer: Self-pay

## 2023-02-02 ENCOUNTER — Telehealth: Payer: Self-pay | Admitting: Physical Therapy

## 2023-02-02 ENCOUNTER — Other Ambulatory Visit: Payer: Self-pay | Admitting: Orthopedic Surgery

## 2023-02-02 DIAGNOSIS — Z89611 Acquired absence of right leg above knee: Secondary | ICD-10-CM

## 2023-02-02 NOTE — Telephone Encounter (Signed)
Done

## 2023-02-02 NOTE — Telephone Encounter (Signed)
Mark [name] is getting his prosthesis on 10/14.  Can you please put a referral in Epic for PT? Thank you Zella Ball

## 2023-02-03 ENCOUNTER — Ambulatory Visit: Payer: Self-pay

## 2023-02-03 NOTE — Patient Outreach (Signed)
Care Coordination   02/03/2023 Name: Mark [name]. MRN: [id number] DOB: [birth year]   Care Coordination Outreach Attempts:  An unsuccessful telephone outreach was attempted today to offer the patient information about available care coordination services.  Follow Up Plan:  Additional outreach attempts will be made to offer the patient care coordination information and services.   Encounter Outcome:  No Answer   Care Coordination Interventions:  No, not indicated    Bary Leriche, RN, MSN [facility]  [facility], [facility] Management Community Coordinator Direct Dial: [phone]  Fax: [phone] Website: [email or web address]

## 2023-02-05 ENCOUNTER — Telehealth: Payer: Self-pay

## 2023-02-05 ENCOUNTER — Telehealth: Payer: Self-pay | Admitting: Orthopedic Surgery

## 2023-02-05 ENCOUNTER — Other Ambulatory Visit: Payer: Self-pay | Admitting: Orthopedic Surgery

## 2023-02-05 MED ORDER — OXYCODONE-ACETAMINOPHEN 5-325 MG PO TABS: 1.0000 | ORAL_TABLET | ORAL | 0 refills | Status: DC | PRN

## 2023-02-05 NOTE — Patient Outreach (Signed)
Care Coordination   02/05/2023 Name: Mark [name]. MRN: [id number] DOB: Jul 19, 1955   Care Coordination Outreach Attempts:  A second unsuccessful outreach was attempted today to offer the patient with information about available care coordination services.  Follow Up Plan:  Additional outreach attempts will be made to offer the patient care coordination information and services.   Encounter Outcome:  No Answer   Care Coordination Interventions:  No, not indicated     Bary Leriche, RN, MSN [facility]  [facility], [facility] Management Community Coordinator Direct Dial: [phone]  Fax: [phone] Website: [email or web address]

## 2023-02-05 NOTE — Telephone Encounter (Signed)
Patient is s/p a right AKA 11/07/2022 requesting refill of Oxycodone 5/325 last filles 12/11/2022 #20 please advise.

## 2023-02-05 NOTE — Telephone Encounter (Signed)
Patient called. Says he is in pain. Would like pain medication called in for him. Cb#[phone]

## 2023-02-06 ENCOUNTER — Other Ambulatory Visit: Payer: Self-pay | Admitting: Family Medicine

## 2023-02-09 DIAGNOSIS — Z89611 Acquired absence of right leg above knee: Secondary | ICD-10-CM | POA: Diagnosis not present

## 2023-02-10 ENCOUNTER — Other Ambulatory Visit: Payer: Self-pay | Admitting: Family Medicine

## 2023-02-11 ENCOUNTER — Telehealth: Payer: Self-pay

## 2023-02-11 NOTE — Patient Outreach (Signed)
Care Coordination   Follow Up Visit Note   02/11/2023 Name: Mark [name]. MRN: [id number] DOB: 08-16-1955  [name]. is a 67 y.o. year old male who sees Deeann Saint, MD for primary care. I spoke with  [name]. by phone today.  What matters to the patients health and wellness today?  Learning to walk with new prothesis    Goals Addressed             This Visit's Progress    Right AKA       Patient Goals/Self Care Activities: -Patient/Caregiver will self-administer medications as prescribed as evidenced by self-report/primary caregiver report  -Patient/Caregiver will attend all scheduled provider appointments as evidenced by clinician review of documented attendance to scheduled appointments and patient/caregiver report -Patient/Caregiver will call provider office for new concerns or questions as evidenced by review of documented incoming telephone call notes and patient report   Patient reports he got his new prothesis and is walking on it with a cane to support.  Therapy ordered and first appointment 02-16-23 for gait training.  No transportation barriers as patient uses [employer].  He reports some pain due to previous fall on [place] [employer] [place].  Using oxycodone for pain.  Discussed importance of pain control.  Also advised on stump care and checking site daily for soreness, redness, or open areas.  He verbalized understanding.  No concerns.          SDOH assessments and interventions completed:  Yes     Care Coordination Interventions:  Yes, provided   Follow up plan: Follow up call scheduled for November    Encounter Outcome:  Patient Visit Completed   Bary Leriche, RN, MSN [place]  [facility], [facility] Management Community Coordinator Direct Dial: [phone]  Fax: [phone] Website: [email or web address]

## 2023-02-11 NOTE — Patient Instructions (Signed)
Visit Information  Thank you for taking time to visit with me today. Please don't hesitate to contact me if I can be of assistance to you.   Following are the goals we discussed today:   Goals Addressed             This Visit's Progress    Right AKA       Patient Goals/Self Care Activities: -Patient/Caregiver will self-administer medications as prescribed as evidenced by self-report/primary caregiver report  -Patient/Caregiver will attend all scheduled provider appointments as evidenced by clinician review of documented attendance to scheduled appointments and patient/caregiver report -Patient/Caregiver will call provider office for new concerns or questions as evidenced by review of documented incoming telephone call notes and patient report   Patient reports he got his new prothesis and is walking on it with a cane to support.  Therapy ordered and first appointment 02-16-23 for gait training.  No transportation barriers as patient [place] [employer].  He reports some pain due to previous fall on the [employer] [place].  Using oxycodone for pain.  Discussed importance of pain control.  Also advised on stump care and checking site daily for soreness, redness, or open areas.  He verbalized understanding.  No concerns.          Our next appointment is by telephone on 03/11/23 at 0930 am  Please call the care guide team at [phone] if you need to cancel or reschedule your appointment.   If you are experiencing a Mental Health or Behavioral Health Crisis or need someone to talk to, please call the Suicide and Crisis Lifeline: 988   The patient verbalized understanding of instructions, educational materials, and care plan provided today and DECLINED offer to receive copy of patient instructions, educational materials, and care plan.   The patient has been provided with contact information for the care management team and has been advised to call with any health related questions or concerns.   Bary Leriche, RN, MSN [facility], [facility] Management Community Coordinator Direct Dial: [phone]  Fax: [phone] Website: [email or web address]

## 2023-02-16 ENCOUNTER — Other Ambulatory Visit: Payer: Self-pay

## 2023-02-16 ENCOUNTER — Encounter: Payer: Self-pay | Admitting: Physical Therapy

## 2023-02-16 ENCOUNTER — Ambulatory Visit (INDEPENDENT_AMBULATORY_CARE_PROVIDER_SITE_OTHER): Payer: Medicare HMO | Admitting: Physical Therapy

## 2023-02-16 DIAGNOSIS — M6281 Muscle weakness (generalized): Secondary | ICD-10-CM | POA: Diagnosis not present

## 2023-02-16 DIAGNOSIS — M25651 Stiffness of right hip, not elsewhere classified: Secondary | ICD-10-CM

## 2023-02-16 DIAGNOSIS — R2681 Unsteadiness on feet: Secondary | ICD-10-CM

## 2023-02-16 DIAGNOSIS — R296 Repeated falls: Secondary | ICD-10-CM | POA: Diagnosis not present

## 2023-02-16 DIAGNOSIS — R2689 Other abnormalities of gait and mobility: Secondary | ICD-10-CM | POA: Diagnosis not present

## 2023-02-16 NOTE — Therapy (Signed)
OUTPATIENT PHYSICAL THERAPY PROSTHETIC EVALUATION   Patient Name: Mark [name]. MRN: [id number] DOB:[birth year], 67 y.o., male Today's Date: 02/16/2023  PCP: Abbe Amsterdam, MD REFERRING PROVIDER: Aldean Baker, MD  END OF SESSION:  PT End of Session - 02/16/23 1538     Visit Number 1    Number of Visits 26    Date for PT Re-Evaluation 05/14/23    Authorization Type [employer] Medicare HMO/PPO and [employer] PPO    Progress Note Due on Visit 10    PT Start Time 1316    PT Stop Time 1354    PT Time Calculation (min) 38 min    Equipment Utilized During Treatment Gait belt    Activity Tolerance Patient tolerated treatment well;Patient limited by pain;Patient limited by fatigue    Behavior During Therapy [facility] for tasks assessed/performed             Past Medical History:  Diagnosis Date   Alcohol abuse    Anemia    Arthritis    Ascites    Cirrhosis ([name])    Coffee ground emesis    Dehydration 06/17/2017   Febrile illness    GERD (gastroesophageal reflux disease)    Heart murmur    History of blood transfusion    Hyperlipidemia    Hypertension    Leg swelling    Myocardial infarction ([name]) 2012   Preop cardiovascular exam 04/14/2013   Sepsis ([name]) 06/17/2017   Septic shock ([name]) 06/18/2017   SIRS (systemic inflammatory response syndrome) ([name]) 07/11/2017   Stroke ([name]) 10/2009   TIA   Thrombocytopenia ([name])    Past Surgical History:  Procedure Laterality Date   AMPUTATION Right 11/07/2022   Procedure: RIGHT ABOVE KNEE AMPUTATION;  Surgeon: Nadara Mustard, MD;  Location: [facility] OR;  Service: Orthopedics;  Laterality: Right;   COLONOSCOPY WITH PROPOFOL N/A 02/07/2016   Procedure: COLONOSCOPY WITH PROPOFOL;  Surgeon: Rachael Fee, MD;  Location: [facility] ENDOSCOPY;  Service: Endoscopy;  Laterality: N/A;   ESOPHAGOGASTRODUODENOSCOPY (EGD) WITH PROPOFOL N/A 02/07/2016   Procedure: ESOPHAGOGASTRODUODENOSCOPY (EGD) WITH PROPOFOL;  Surgeon: Rachael Fee, MD;   Location: [facility] ENDOSCOPY;  Service: Endoscopy;  Laterality: N/A;   ESOPHAGOGASTRODUODENOSCOPY (EGD) WITH PROPOFOL N/A 06/22/2017   Procedure: ESOPHAGOGASTRODUODENOSCOPY (EGD) WITH PROPOFOL;  Surgeon: Beverley Fiedler, MD;  Location: [facility] ENDOSCOPY;  Service: Gastroenterology;  Laterality: N/A;   EXCISIONAL TOTAL KNEE ARTHROPLASTY WITH ANTIBIOTIC SPACERS Right 03/20/2020   Procedure: Resection right total knee arthroplasty and placement of antibiotic spacer;  Surgeon: Durene Romans, MD;  Location: [facility] ORS;  Service: Orthopedics;  Laterality: Right;  90 mins   HERNIA REPAIR Right    inguinal   INGUINAL HERNIA REPAIR Left 02/08/2021   Procedure: OPEN LEFT INGUINAL HERNIA REPAIR WITH MESH;  Surgeon: Abigail Miyamoto, MD;  Location: [facility] OR;  Service: General;  Laterality: Left;   IRRIGATION AND DEBRIDEMENT KNEE Right 11/13/2020   Procedure: IRRIGATION AND DEBRIDEMENT KNEE;  Surgeon: Durene Romans, MD;  Location: [facility] ORS;  Service: Orthopedics;  Laterality: Right;   JOINT REPLACEMENT     KNEE ARTHROSCOPY     bilateral/  12/14   REIMPLANTATION OF TOTAL KNEE Right 08/02/2020   Procedure: REIMPLANTATION/REVISION OF TOTAL KNEE WITH REMOVAL OF ANTIBIOTIC SPACER;  Surgeon: Durene Romans, MD;  Location: [facility] ORS;  Service: Orthopedics;  Laterality: Right;    TEE WITHOUT CARDIOVERSION N/A 05/13/2018   Procedure: TRANSESOPHAGEAL ECHOCARDIOGRAM (TEE);  Surgeon: Parke Poisson, MD;  Location: [facility] ENDOSCOPY;  Service: Cardiovascular;  Laterality: N/A;   TOTAL KNEE ARTHROPLASTY Right 05/02/2013   Procedure: RIGHT TOTAL KNEE ARTHROPLASTY;  Surgeon: Shelda Pal, MD;  Location: [facility] ORS;  Service: Orthopedics;  Laterality: Right;   Patient Active Problem List   Diagnosis Date Noted   Infection of total knee replacement ([name]) 11/07/2022   Hx of AKA (above knee amputation), right ([name]) 11/07/2022   Right knee pain 07/08/2022   Cerebrovascular disease 07/08/2022   Renal cyst 07/08/2022   Protein-calorie  malnutrition, severe 07/08/2022   Toxic metabolic encephalopathy 02/26/2022   Septic joint of right knee joint ([name]) 11/08/2020   S/P right TKA reimplantation 08/02/2020   Infection of total left knee replacement ([name]) 03/20/2020   Cirrhosis of liver without ascites ([name])    AKI (acute kidney injury) ([name])    Bacteremia    Spinal stenosis of lumbar region without neurogenic claudication    Aspiration pneumonia ([name]) 09/24/2018   Abscess in epidural space of lumbar spine    MRSA bacteremia 09/21/2018   Infection of prosthetic right knee joint ([name]) 09/20/2018   Anemia of chronic disease 09/20/2018   Hypoalbuminemia 09/20/2018   Hypoglycemia without diagnosis of diabetes mellitus 09/20/2018   Epidural abscess 09/20/2018   Hyperkalemia 05/18/2018   Edentulous 05/11/2018   Pancytopenia ([name]) 05/10/2018   CAD (coronary artery disease) 05/10/2018   Hepatic encephalopathy ([name]) 05/10/2018   Alcohol abuse 05/10/2018   GERD (gastroesophageal reflux disease) 05/10/2018   Duodenal ulcer    Renal failure    Acute on chronic anemia    Hypotension 06/17/2017   Hyponatremia 06/17/2017   Leg edema, right 06/17/2017   Acute metabolic encephalopathy 06/17/2017   Anemia, iron deficiency    Benign neoplasm of ascending colon    Hemorrhoids    Portal hypertensive gastropathy ([name])    Gastritis and gastroduodenitis    Esophageal varices without bleeding ([name])    H/O: CVA (cerebrovascular accident) 12/01/2013   ACS (acute coronary syndrome) ([name]) 12/01/2013   Anasarca 12/01/2013   Polysubstance abuse ([name]) 12/01/2013   CKD (chronic kidney disease) stage 3, GFR 30-59 ml/min ([name]) 12/01/2013   S/P right TKA 05/02/2013   Murmur 04/14/2013   Right inguinal hernia 12/26/2010   Cirrhosis, alcoholic ([name]) 12/20/2010    ONSET DATE: 02/10/2023 prosthesis delivery  REFERRING DIAG: Z89.611 (ICD-10-CM) - Hx of AKA (above knee amputation), right   THERAPY DIAG:  Other abnormalities of gait and  mobility  Unsteadiness on feet  Repeated falls  Muscle weakness (generalized)  Stiffness of right hip, not elsewhere classified  Rationale for Evaluation and Treatment: Rehabilitation  SUBJECTIVE:   SUBJECTIVE STATEMENT: Patient underwent a right Transfemoral Amputation on 11/07/2022 due to infected TKA. Initial right TKA 2015 with antibiotic spacers 2021 & 2022 with I&D. He got prosthesis on 02/10/2023.  He has worn prosthesis 5 of 6 days from 2-8 hours.    PERTINENT HISTORY: right TFA 11/07/22, inguinal hernia repair BLEs, ETOH abuse, cirrhosis, arthritis, GERD, HTN, HLD, heart murmur, MI, TIA 2011, systemic inflammatory response syndrome  PAIN:  Are you having pain? Yes: NPRS scale: today 6/10 and in last week 4/10 to 8/10 Pain location: right residual limb more distal lateral Pain description: sharp Aggravating factors: unknown Relieving factors:  pain pill, sit to rest  PRECAUTIONS: Fall  WEIGHT BEARING RESTRICTIONS: No  FALLS: Has patient fallen in last 6 months? Yes. Number of falls ~1x/wk before & since amputation.    LIVING ENVIRONMENT: Lives with: lives with their spouse and dog 60#  Lives in: [place]  Home Access: Stairs to enter Home layout: One level Stairs: Yes: External: 2 steps to porch without rails and threshold into house Has following equipment at home: Single point cane, Walker - 2 wheeled, Crutches, Wheelchair (manual), and Tour manager  OCCUPATION:   on disability. 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   OBJECTIVE:  COGNITION: Overall cognitive status: Within functional limits for tasks assessed  POSTURE: rounded shoulders, forward head, flexed trunk , and weight shift left  LOWER EXTREMITY  ROM:  ROM P:passive  A:active Right eval Left eval  Hip flexion    Hip extension Thomas -9*   Hip abduction    Hip adduction    Hip internal rotation    Hip external rotation    Knee flexion    Knee extension    Ankle dorsiflexion    Ankle plantarflexion    Ankle inversion    Ankle eversion     (Blank rows = not tested)  LOWER EXTREMITY MMT:  MMT Right eval Left eval  Hip flexion 4/5 5/5  Hip extension 3/5 4/5  Hip abduction 3+/5 4/5  Hip adduction    Hip internal rotation    Hip external rotation    Knee flexion  5/5  Knee extension  5/5  Ankle dorsiflexion  5/5  Ankle plantarflexion    Ankle inversion    Ankle eversion    At Evaluation all strength testing is grossly seated and functionally standing / gait. (Blank rows = not tested)  BED MOBILITY:  Modified independent supine <> sit  TRANSFERS: Sit to stand: SBA requires use of armrests from stable chair and external support for stabilization Stand to sit: SBA requires use or armrests on stable chair and sturdy chair.   FUNCTIONAL TESTs:  Sharlene Motts Balance Scale: 16/56  [facility] PT Assessment - 02/16/23 1320       Standardized Balance Assessment   Standardized Balance Assessment Berg Balance Test      Berg Balance Test   Sit to Stand Needs minimal aid to stand or to stabilize    Standing Unsupported Able to stand 2 minutes with supervision    Sitting with Back Unsupported but Feet Supported on Floor or Stool Able to sit safely and securely 2 minutes    Stand to Sit Controls descent by using hands    Transfers Able to transfer safely, definite need of hands    Standing Unsupported with Eyes Closed Needs help to keep from falling    Standing Unsupported with Feet Together Needs help to attain position and unable to hold for 15 seconds    From Standing, Reach Forward with Outstretched Arm Reaches forward but needs supervision    From Standing Position, Pick up Object from Floor Unable to try/needs assist to keep  balance    From Standing Position, Turn to Look Behind Over each Shoulder Needs supervision when turning    Turn 360 Degrees Needs assistance while turning    Standing Unsupported, Alternately Place Feet on Step/Stool Needs assistance to keep from falling or unable to try    Standing Unsupported, One Foot in Front Loses balance while stepping or standing    Standing on One Leg Unable to try or needs assist to prevent fall    Total Score 16  Berg comment: BERG  < 36 high risk for falls (close to 100%) 46-51 moderate (>50%)   37-45 significant (>80%) 52-55 lower (> 25%)             GAIT: Gait pattern: step to pattern, decreased step length- Left, decreased stance time- Right, decreased hip/knee flexion- Right, circumduction- Right, Right hip hike, antalgic, trendelenburg, lateral hip instability, trunk flexed, abducted- Right, and poor foot clearance- Right Distance walked: 100' Assistive device utilized: Crutches and TFA prosthesis Level of assistance: SBA with crutches Comments: pt amb 3' with HHA maxA   CURRENT PROSTHETIC WEAR ASSESSMENT: 02/16/2023 / Evaluation:  Patient is dependent with: skin check, residual limb care, care of non-amputated limb, prosthetic cleaning, ply sock cleaning, correct ply sock adjustment, proper wear schedule/adjustment, and proper weight-bearing schedule/adjustment Donning prosthesis: SBA Doffing prosthesis: SBA Prosthetic wear tolerance: 2-8 hours, 1x/day5 of 6 days since delivery Prosthetic weight bearing tolerance: 5 minutes with limb pain 7/10 Edema: pitting edema Residual limb condition: no open areas, normal color, temperature & moisture, scar is mobile with slight invagination. Prosthetic description: silicon liner with velcro landyard, ischial containment socket with flexible inner liner, multi-axial hydraulic knee, dynamic response foot K code/activity level with prosthetic use: Level 3    TODAY'S TREATMENT:                                                                                                                              DATE:  02/16/2023:   PATIENT EDUCATION: PATIENT EDUCATED ON FOLLOWING PROSTHETIC CARE: Education details:   Skin check, Prosthetic cleaning, Propper donning, and Proper wear schedule/adjustment Prosthetic wear tolerance: 3 hours 2x/day, 7 days/week Person educated: Patient Education method: Explanation, Demonstration, Tactile cues, and Verbal cues Education comprehension: verbalized understanding, verbal cues required, tactile cues required, and needs further education  HOME EXERCISE PROGRAM:  ASSESSMENT:  CLINICAL IMPRESSION: Patient is a 67 y.o. male who was seen today for physical therapy evaluation and treatment for prosthetic training with right Transfemoral Amputation.  Patient is dependent in prosthetic care and has limited wear of the prosthesis.  Patient has residual limb pain limiting function.  He has impaired right hip extension range and decreased strength bilateral lower extremities.  Berg balance score indicates high risk of falls and dependency and standing ADLs.  Patient has dependent prosthetic gait requiring bilateral upper extremity support of crutches and gait deviations indicating high risk of falls.  Patient would benefit from skilled PT to improve function and safety with his prosthesis.  OBJECTIVE IMPAIRMENTS: Abnormal gait, decreased activity tolerance, decreased balance, decreased endurance, decreased knowledge of condition, decreased knowledge of use of DME, decreased mobility, difficulty walking, decreased ROM, decreased strength, decreased safety awareness, postural dysfunction, prosthetic dependency , and pain.   ACTIVITY LIMITATIONS: carrying, lifting, standing, stairs, transfers, and locomotion level  PARTICIPATION LIMITATIONS: meal prep, cleaning, driving, community activity, occupation, and yard work  PERSONAL FACTORS: Education, Fitness, Past/current experiences,  Time since onset of injury/illness/exacerbation, and 3+  comorbidities: see PMH  are also affecting patient's functional outcome.   REHAB POTENTIAL: Good  CLINICAL DECISION MAKING: Evolving/moderate complexity  EVALUATION COMPLEXITY: Moderate   GOALS: Goals reviewed with patient? Yes  SHORT TERM GOALS: Target date: 03/19/2023  Patient donnes prosthesis modified independent & verbalizes proper cleaning. Baseline: SEE OBJECTIVE DATA Goal status: INITIAL 2.  Patient tolerates prosthesis >10 hrs total /day without skin issues or limb pain >5/10 after standing. Baseline: SEE OBJECTIVE DATA Goal status: INITIAL  3.  Patient able to reach 7" and look over both shoulders without UE support with supervision. Baseline: SEE OBJECTIVE DATA Goal status: INITIAL  4. Patient ambulates 200' with crutches & prosthesis with supervision / verbal cues only. Baseline: SEE OBJECTIVE DATA Goal status: INITIAL  5. Patient negotiates ramps & curbs with crutches & prosthesis with supervision. Baseline: SEE OBJECTIVE DATA Goal status: INITIAL  LONG TERM GOALS: Target date: 05/14/2023  Patient demonstrates & verbalized understanding of prosthetic care to enable safe utilization of prosthesis. Baseline: SEE OBJECTIVE DATA Goal status: INITIAL  Patient tolerates prosthesis wear >90% of awake hours without skin or limb pain issues. Baseline: SEE OBJECTIVE DATA Goal status: INITIAL  Berg Balance >36/56 to indicate lower fall risk Baseline: SEE OBJECTIVE DATA Goal status: INITIAL  Patient ambulates >500' with prosthesis and cane or less independently Baseline: SEE OBJECTIVE DATA Goal status: INITIAL  Patient negotiates ramps, curbs & stairs with single rail with prosthesis and cane independently. Baseline: SEE OBJECTIVE DATA Goal status: INITIAL  Patient demonstrates & verbalizes lifting, carrying, pushing, pulling with prosthesis safely. Baseline: SEE OBJECTIVE DATA Goal status:  INITIAL  PLAN:  PT FREQUENCY: 2x/week  PT DURATION: 13 weeks / 90 days  PLANNED INTERVENTIONS: 97164- PT Re-evaluation, 97110-Therapeutic exercises, 97530- Therapeutic activity, 97112- Neuromuscular re-education, 97535- Self Care, 16109- Manual therapy, [phone]- Gait training, [phone]- Prosthetic training, Patient/Family education, Balance training, Stair training, Vestibular training, DME instructions, Therapeutic exercises, Therapeutic activity, Neuromuscular re-education, Gait training, and Self Care  PLAN FOR NEXT SESSION: prosthetic care education, HEP for pre-gait and balance   Vladimir Faster, PT, DPT 02/16/2023, 4:17 PM

## 2023-02-18 ENCOUNTER — Emergency Department (HOSPITAL_COMMUNITY)
Admission: EM | Admit: 2023-02-18 | Discharge: 2023-02-18 | Disposition: A | Discharge status: Home / Self Care | Payer: Medicare HMO | Attending: Emergency Medicine | Admitting: Emergency Medicine

## 2023-02-18 ENCOUNTER — Other Ambulatory Visit: Payer: Self-pay

## 2023-02-18 ENCOUNTER — Encounter: Payer: Self-pay | Admitting: Physical Therapy

## 2023-02-18 ENCOUNTER — Encounter (HOSPITAL_COMMUNITY): Payer: Self-pay | Admitting: Emergency Medicine

## 2023-02-18 ENCOUNTER — Encounter: Payer: Medicare HMO | Admitting: Physical Therapy

## 2023-02-18 DIAGNOSIS — D61818 Other pancytopenia: Secondary | ICD-10-CM | POA: Diagnosis not present

## 2023-02-18 DIAGNOSIS — T50901A Poisoning by unspecified drugs, medicaments and biological substances, accidental (unintentional), initial encounter: Secondary | ICD-10-CM

## 2023-02-18 DIAGNOSIS — R6889 Other general symptoms and signs: Secondary | ICD-10-CM | POA: Diagnosis not present

## 2023-02-18 DIAGNOSIS — R2689 Other abnormalities of gait and mobility: Secondary | ICD-10-CM

## 2023-02-18 DIAGNOSIS — I10 Essential (primary) hypertension: Secondary | ICD-10-CM | POA: Diagnosis not present

## 2023-02-18 DIAGNOSIS — Z8673 Personal history of transient ischemic attack (TIA), and cerebral infarction without residual deficits: Secondary | ICD-10-CM | POA: Diagnosis not present

## 2023-02-18 DIAGNOSIS — N189 Chronic kidney disease, unspecified: Secondary | ICD-10-CM | POA: Insufficient documentation

## 2023-02-18 DIAGNOSIS — R400 Somnolence: Secondary | ICD-10-CM | POA: Diagnosis not present

## 2023-02-18 DIAGNOSIS — I2510 Atherosclerotic heart disease of native coronary artery without angina pectoris: Secondary | ICD-10-CM | POA: Diagnosis not present

## 2023-02-18 DIAGNOSIS — T402X1A Poisoning by other opioids, accidental (unintentional), initial encounter: Secondary | ICD-10-CM | POA: Insufficient documentation

## 2023-02-18 DIAGNOSIS — Z743 Need for continuous supervision: Secondary | ICD-10-CM | POA: Diagnosis not present

## 2023-02-18 LAB — COMPREHENSIVE METABOLIC PANEL
ALT: 26 U/L (ref 0–44)
AST: 38 U/L (ref 15–41)
Albumin: 3.4 g/dL — ABNORMAL LOW (ref 3.5–5.0)
Alkaline Phosphatase: 116 U/L (ref 38–126)
Anion gap: 5 (ref 5–15)
BUN: 20 mg/dL (ref 8–23)
CO2: 22 mmol/L (ref 22–32)
Calcium: 8.4 mg/dL — ABNORMAL LOW (ref 8.9–10.3)
Chloride: 113 mmol/L — ABNORMAL HIGH (ref 98–111)
Creatinine, Ser: 1.29 mg/dL — ABNORMAL HIGH (ref 0.61–1.24)
GFR, Estimated: >60 mL/min (ref >60)
Glucose, Bld: 98 mg/dL (ref 70–99)
Potassium: 3.6 mmol/L (ref 3.5–5.1)
Sodium: 140 mmol/L (ref 135–145)
Total Bilirubin: 1 mg/dL (ref 0.3–1.2)
Total Protein: 6.9 g/dL (ref 6.5–8.1)

## 2023-02-18 LAB — CBC WITH DIFFERENTIAL/PLATELET
Abs Immature Granulocytes: 0.02 10*3/uL (ref 0.00–0.07)
Basophils Absolute: 0 10*3/uL (ref 0.0–0.1)
Basophils Relative: 1 %
Eosinophils Absolute: 0.3 10*3/uL (ref 0.0–0.5)
Eosinophils Relative: 11 %
HCT: 28.2 % — ABNORMAL LOW (ref 39.0–52.0)
Hemoglobin: 9.4 g/dL — ABNORMAL LOW (ref 13.0–17.0)
Immature Granulocytes: 1 %
Lymphocytes Relative: 43 %
Lymphs Abs: 1.4 10*3/uL (ref 0.7–4.0)
MCH: 33.1 pg (ref 26.0–34.0)
MCHC: 33.3 g/dL (ref 30.0–36.0)
MCV: 99.3 fL (ref 80.0–100.0)
Monocytes Absolute: 0.5 10*3/uL (ref 0.1–1.0)
Monocytes Relative: 14 %
Neutro Abs: 1 10*3/uL — ABNORMAL LOW (ref 1.7–7.7)
Neutrophils Relative %: 30 %
Platelets: 139 10*3/uL — ABNORMAL LOW (ref 150–400)
RBC: 2.84 MIL/uL — ABNORMAL LOW (ref 4.22–5.81)
RDW: 13.8 % (ref 11.5–15.5)
WBC: 3.2 10*3/uL — ABNORMAL LOW (ref 4.0–10.5)
nRBC: 0 % (ref 0.0–0.2)

## 2023-02-18 LAB — ETHANOL: Alcohol, Ethyl (B): <10 mg/dL (ref <10)

## 2023-02-18 LAB — ACETAMINOPHEN LEVEL: Acetaminophen (Tylenol), Serum: <10 ug/mL — ABNORMAL LOW (ref 10–30)

## 2023-02-18 LAB — SALICYLATE LEVEL: Salicylate Lvl: <7 mg/dL — ABNORMAL LOW (ref 7.0–30.0)

## 2023-02-18 MED ORDER — ONDANSETRON HCL 4 MG/2ML IJ SOLN
4.0000 mg | Freq: Once | INTRAMUSCULAR | Status: AC
  Administered 2023-02-18: 4 mg via INTRAVENOUS
  Filled 2023-02-18: qty 2

## 2023-02-18 MED ORDER — NALOXONE HCL 4 MG/0.1ML NA LIQD
1.0000 | Freq: Once | NASAL | Status: AC
  Administered 2023-02-18: 1 via NASAL
  Filled 2023-02-18: qty 4

## 2023-02-18 MED ORDER — NALOXONE HCL 4 MG/0.1ML NA LIQD: NASAL | 0 refills | Status: AC

## 2023-02-18 NOTE — Discharge Instructions (Addendum)
It was a pleasure taking care of you today.  As discussed, I suspect you overdosed on opioids.  I am sending you home with Narcan.  Stop using drugs.  Return to the ER for any new or worsening symptoms.  Have your labs rechecked in 2-3 days.

## 2023-02-18 NOTE — ED Provider Notes (Signed)
[name] EMERGENCY DEPARTMENT AT [facility] Provider Note   CSN: [id number] Arrival date & time: 02/18/23  1401     History  Chief Complaint  Patient presents with   Drug Problem    Mark [name]. is a 67 y.o. male with a past medical history significant for alcoholic cirrhosis, CKD, history of CVA, CAD, alcohol abuse who presents to the ED via EMS from [facility] due to difficulty arousing patient.  Per EMS patient took 3 Percocets prior to physical therapy appointment.  During my initial evaluation patient hard to arouse.  Intranasal Narcan given. Patient became more arousable and notes he took 2 percocets and drank beer; however does not remember going to [place]. Unsure how he got there. Denies SI and HI. No hallucinations.   Level 5 caveat secondary to AMS  History obtained from patient, EMS, and past medical records. No interpreter used during encounter.       Home Medications Prior to Admission medications   Medication Sig Start Date End Date Taking? Authorizing Provider  naloxone [facility]) nasal spray 4 mg/0.1 mL Use as directed in case of overdose 02/18/23  Yes Jermiya Reichl, Merla Riches, PA-C  doxycycline (VIBRA-TABS) 100 MG tablet Take 1 tablet (100 mg total) by mouth 2 (two) times daily. 11/11/22   Nadara Mustard, MD  ferrous sulfate 325 (65 FE) MG tablet Take 1 tablet (325 mg total) by mouth 2 (two) times daily with a meal. Take for two weeks as tolerated. Patient taking differently: Take 325 mg by mouth every other day. 08/15/20   Deeann Saint, MD  furosemide (LASIX) 20 MG tablet TAKE 1 TABLET(20 MG) BY MOUTH TWICE DAILY 02/09/23   Deeann Saint, MD  HYDROcodone-acetaminophen (NORCO) 10-325 MG tablet Take 1 tablet by mouth every 4 (four) hours as needed for moderate pain. 11/11/22   Nadara Mustard, MD  HYDROcodone-acetaminophen (NORCO/VICODIN) 5-325 MG tablet Take 1 tablet by mouth every 4 (four) hours as needed for moderate pain. 11/21/22   Adonis Huguenin, NP  naproxen sodium (ALEVE) 220 MG tablet Take 440 mg by mouth daily as needed (pain).    [provider]  oxyCODONE-acetaminophen (PERCOCET/ROXICET) 5-325 MG tablet Take 1 tablet by mouth every 4 (four) hours as needed for severe pain. 02/05/23   Nadara Mustard, MD      Allergies    Patient has no known allergies.    Review of Systems   Review of Systems  Respiratory:  Negative for shortness of breath.   Cardiovascular:  Negative for chest pain.  Psychiatric/Behavioral:  Negative for suicidal ideas.     Physical Exam Updated Vital Signs BP (!) 145/84   Pulse 100   Temp 98 F (36.7 C) (Oral)   Resp 17   Ht 5\' 8"  (1.727 m)   Wt 54.4 kg   SpO2 100%   BMI 18.25 kg/m  Physical Exam Vitals and nursing note reviewed.  Constitutional:      General: He is not in acute distress.    Appearance: He is not ill-appearing.  HENT:     Head: Normocephalic.  Eyes:     Pupils: Pupils are equal, round, and reactive to light.  Cardiovascular:     Rate and Rhythm: Normal rate and regular rhythm.     Pulses: Normal pulses.     Heart sounds: Normal heart sounds. No murmur heard.    No friction rub. No gallop.  Pulmonary:     Effort: Pulmonary effort  is normal.     Breath sounds: Normal breath sounds.  Abdominal:     General: Abdomen is flat. There is no distension.     Palpations: Abdomen is soft.     Tenderness: There is no abdominal tenderness. There is no guarding or rebound.  Musculoskeletal:        General: Normal range of motion.     Cervical back: Neck supple.  Skin:    General: Skin is warm and dry.  Neurological:     General: No focal deficit present.     Mental Status: He is alert.  Psychiatric:        Mood and Affect: Mood normal.        Behavior: Behavior normal.     ED Results / Procedures / Treatments   Labs (all labs ordered are listed, but only abnormal results are displayed) Labs Reviewed  COMPREHENSIVE METABOLIC PANEL - Abnormal; Notable for  the following components:      Result Value   Chloride 113 (*)    Creatinine, Ser 1.29 (*)    Calcium 8.4 (*)    Albumin 3.4 (*)    All other components within normal limits  CBC WITH DIFFERENTIAL/PLATELET - Abnormal; Notable for the following components:   WBC 3.2 (*)    RBC 2.84 (*)    Hemoglobin 9.4 (*)    HCT 28.2 (*)    Platelets 139 (*)    Neutro Abs 1.0 (*)    All other components within normal limits  ACETAMINOPHEN LEVEL - Abnormal; Notable for the following components:   Acetaminophen (Tylenol), Serum <10 (*)    All other components within normal limits  SALICYLATE LEVEL - Abnormal; Notable for the following components:   Salicylate Lvl <7.0 (*)    All other components within normal limits  ETHANOL  RAPID URINE DRUG SCREEN, HOSP PERFORMED    EKG EKG Interpretation Date/Time:  Wednesday February 18 2023 15:02:47 EDT Ventricular Rate:  80 PR Interval:  169 QRS Duration:  82 QT Interval:  383 QTC Calculation: 442 R Axis:   83  Text Interpretation: Sinus rhythm Anterior infarct, old Confirmed by Alvino Blood ([id number]) on 02/18/2023 3:47:03 PM  Radiology No results found.  Procedures .Critical Care  Performed by: Mannie Stabile, PA-C Authorized by: Mannie Stabile, PA-C   Critical care provider statement:    Critical care time (minutes):  31   Critical care was necessary to treat or prevent imminent or life-threatening deterioration of the following conditions:  Toxidrome   Critical care was time spent personally by me on the following activities:  Development of treatment plan with patient or surrogate, discussions with consultants, evaluation of patient's response to treatment, examination of patient, ordering and review of laboratory studies, ordering and review of radiographic studies, ordering and performing treatments and interventions, pulse oximetry, re-evaluation of patient's condition and review of old charts   I assumed direction of critical  care for this patient from another provider in my specialty: no       Medications Ordered in ED Medications  naloxone (NARCAN) nasal spray 4 mg/0.1 mL (1 spray Nasal Provided for home use 02/18/23 1526)  ondansetron (ZOFRAN) injection 4 mg (4 mg Intravenous Given 02/18/23 1749)    ED Course/ Medical Decision Making/ A&P Clinical Course as of 02/18/23 1832  Wed Feb 18, 2023  1538 Reassessed patient.  Patient awake laying in bed. No complaints.  [CA]  1822 Reassessed patient at bedside.  Patient able to ambulate  without difficulty.  Tolerating p.o.  Labs reassuring.  Will discharge patient with Narcan.  Patient stable for discharge. [CA]    Clinical Course User Index [CA] Mannie Stabile, PA-C                                 Medical Decision Making Amount and/or Complexity of Data Reviewed Independent Historian: EMS Labs: ordered. Decision-making details documented in ED Course.  Risk Prescription drug management.   This patient presents to the ED for concern of difficult to arouse, this involves an extensive number of treatment options, and is a complaint that carries with it a high risk of complications and morbidity.  The differential diagnosis includes overdose, infection, metabolic derangement, etc  67 year old male presents to the ED via EMS from [facility] due to difficulty arousing patient. Per EMS report patient took Percocet prior to his physical therapy appointment due to right lower extremity pain.  Patient given Narcan during initial evaluation and became more arousable. He notes he took 2 percocets and drank beer; however unsure how he got to [place]. Denies SI and HI. Labs ordered. Patient placed on monitor.   CBC significant for pancytopenia with white blood cell count of 3.2, hemoglobin at 9.4, platelets at 139. Pancytopenia appears chronic. Ethanol, acetaminophen, salicylate levels within normal limits.  CMP significant for elevated creatinine 1.29.  Normal BUN.   EKG demonstrates normal sinus rhythm.  No signs of acute ischemia.  Reassessed patient numerous times.See notes above. Suspect symptoms related to accidental opioid overdose given his reaction to narcan. Low suspicion for intracranial abnormalities. No neurological deficits on exam. Patient discharged with narcan. Advised patient not to use any drugs. Strict ED precautions discussed with patient. Patient states understanding and agrees to plan. Patient discharged home in no acute distress and stable vitals.  Co morbidities that complicate the patient evaluation  CKD Cardiac Monitoring: / EKG:  The patient was maintained on a cardiac monitor.  I personally viewed and interpreted the cardiac monitored which showed an underlying rhythm of: NSR  Social Determinants of Health:  Alcohol abuse  Test / Admission - Considered:  CT head; however neurological exam normal, suspect symptoms related to over        Final Clinical Impression(s) / ED Diagnoses Final diagnoses:  Acute drug overdose, accidental or unintentional, initial encounter  Pancytopenia ([name])    Rx / DC Orders ED Discharge Orders          Ordered    naloxone [facility]) nasal spray 4 mg/0.1 mL        02/18/23 1825              Mannie Stabile, PA-C 02/18/23 1833    Lonell Grandchild, MD 02/19/23 1507

## 2023-02-18 NOTE — ED Notes (Signed)
Pt ambulated approximately 25 ft from the bathroom back to the room without difficulty.

## 2023-02-18 NOTE — ED Triage Notes (Signed)
Pt BIB EMS from [facility] for therapy appointment. Pt reportedly took 3 percocets before appointment. Staff called out d/t inability to arouse pt. Hx of extensive drug use, denies any drugs or alcohol today.   BP 128/60 P 90 RR 10 SpO2 98% CBG 102

## 2023-02-18 NOTE — Therapy (Signed)
[facility] Physical Therapy Clinic [address] [place], [place], [address] Phone: [phone]   Fax:  [phone]  Patient Details  Name: Mark [name]. MRN: [id number] Date of Birth: 12-Apr-1956 Referring Provider:  No ref. provider found  Encounter Date: 02/18/2023   Patient arrived to [facility] for his 1:00 PT appointment.  Staff on 1st floor noted he had an alerted mental status. He was placed in a treatment room and medical staff requested PT to come to the treatment room.  Patient hard to arouse but would wake for a brief second.   BP was 132/67 & HR 117 bpm.   Staff contacted his daughter via phone and she reported that her son (pt's grandson was on his way). 911 was called as patient's state did not appear safe.  He was transported to ED by ambulance.     Vladimir Faster, PT, DPT 02/18/2023, 3:59 PM  [facility] Physical Therapy Clinic [address] [place], [place], [address] Phone: [phone]   Fax:  [phone]

## 2023-02-20 ENCOUNTER — Telehealth: Payer: Self-pay

## 2023-02-20 NOTE — Patient Outreach (Signed)
Care Coordination   Follow Up Visit Note   02/20/2023 Name: Mark [name]. MRN: [id number] DOB: January 30, 1956  Mark [name]. is a 67 y.o. year old male who sees Deeann Saint, MD for primary care. I spoke with  Mark [name]. by phone today.  What matters to the patients health and wellness today?  Phantom pain    Goals Addressed             This Visit's Progress    Right AKA       Patient Goals/Self Care Activities: -Patient/Caregiver will self-administer medications as prescribed as evidenced by self-report/primary caregiver report  -Patient/Caregiver will attend all scheduled provider appointments as evidenced by clinician review of documented attendance to scheduled appointments and patient/caregiver report -Patient/Caregiver will call provider office for new concerns or questions as evidenced by review of documented incoming telephone call notes and patient report   Patient has his new prothesis he reports that he has some difficulty with it and therapy making some adjusts.  Patient sent to ED by therapy due to alerted mental status.  Patient had taken pain medication and drank a beer.    Advised patient on taking medication with alcohol and affects.  He verbalized understanding.  Discussed pain control.   Discussed stump care and checking site daily for soreness, redness, or open areas.  He verbalized understanding.  No concerns.          SDOH assessments and interventions completed:  Yes     Care Coordination Interventions:  Yes, provided   Follow up plan: Follow up call scheduled for November    Encounter Outcome:  Patient Visit Completed   Bary Leriche, RN, MSN [place]  [facility], [facility] Psychiatric Management Community Coordinator Direct Dial: [phone]  Fax: [phone] Website: [email or web address]

## 2023-02-20 NOTE — Patient Instructions (Signed)
Visit Information  Thank you for taking time to visit with me today. Please don't hesitate to contact me if I can be of assistance to you.   Following are the goals we discussed today:   Goals Addressed             This Visit's Progress    Right AKA       Patient Goals/Self Care Activities: -Patient/Caregiver will self-administer medications as prescribed as evidenced by self-report/primary caregiver report  -Patient/Caregiver will attend all scheduled provider appointments as evidenced by clinician review of documented attendance to scheduled appointments and patient/caregiver report -Patient/Caregiver will call provider office for new concerns or questions as evidenced by review of documented incoming telephone call notes and patient report   Patient has his new prothesis he reports that he has some difficulty with it and therapy making some adjusts.  Patient sent to ED by therapy due to alerted mental status.  Patient had taken pain medication and drank a beer.    Advised patient on taking medication with alcohol and affects.  He verbalized understanding.  Discussed pain control.   Discussed stump care and checking site daily for soreness, redness, or open areas.  He verbalized understanding.  No concerns.          Our next appointment is by telephone on 03-11-23 at 930 am  Please call the care guide team at [phone] if you need to cancel or reschedule your appointment.   If you are experiencing a Mental Health or Behavioral Health Crisis or need someone to talk to, please call the Suicide and Crisis Lifeline: 988   The patient verbalized understanding of instructions, educational materials, and care plan provided today and DECLINED offer to receive copy of patient instructions, educational materials, and care plan.   The patient has been provided with contact information for the care management team and has been advised to call with any health related questions or concerns.    Bary Leriche, RN, MSN [facility], [facility] Management Community Coordinator Direct Dial: [phone]  Fax: [phone] Website: [email or web address]

## 2023-02-23 ENCOUNTER — Telehealth: Payer: Self-pay | Admitting: Physical Therapy

## 2023-02-23 ENCOUNTER — Encounter: Payer: Self-pay | Admitting: Physical Therapy

## 2023-02-23 ENCOUNTER — Ambulatory Visit (INDEPENDENT_AMBULATORY_CARE_PROVIDER_SITE_OTHER): Payer: Medicare HMO | Admitting: Physical Therapy

## 2023-02-23 DIAGNOSIS — R2681 Unsteadiness on feet: Secondary | ICD-10-CM | POA: Diagnosis not present

## 2023-02-23 DIAGNOSIS — M25651 Stiffness of right hip, not elsewhere classified: Secondary | ICD-10-CM

## 2023-02-23 DIAGNOSIS — R296 Repeated falls: Secondary | ICD-10-CM

## 2023-02-23 DIAGNOSIS — R2689 Other abnormalities of gait and mobility: Secondary | ICD-10-CM

## 2023-02-23 DIAGNOSIS — M6281 Muscle weakness (generalized): Secondary | ICD-10-CM | POA: Diagnosis not present

## 2023-02-23 NOTE — Telephone Encounter (Signed)
Can you have pt sign Ipad and give crutches to Mark [name] on Wednesday?

## 2023-02-23 NOTE — Telephone Encounter (Signed)
Mark [name] needs crutches.  Can you get some standard adult crutches that we have here and bill them to him? I can adjust them.  He is in PT today until 230 and Wednesday, 10/30 230-315. Thank you Zella Ball

## 2023-02-25 ENCOUNTER — Encounter: Payer: Medicare HMO | Admitting: Physical Therapy

## 2023-02-25 ENCOUNTER — Telehealth: Payer: Self-pay | Admitting: Physical Therapy

## 2023-02-25 NOTE — Telephone Encounter (Signed)
Looks like pt is a no show for his PT apt today. Just let us know when he will be coming back in and we can get the crutches next time.

## 2023-02-25 NOTE — Telephone Encounter (Signed)
PT called regarding no show appt.  Left message including next appt & call back number.

## 2023-02-26 ENCOUNTER — Emergency Department (HOSPITAL_COMMUNITY): Payer: Medicare HMO

## 2023-02-26 ENCOUNTER — Other Ambulatory Visit: Payer: Self-pay

## 2023-02-26 ENCOUNTER — Inpatient Hospital Stay (HOSPITAL_COMMUNITY)
Admission: EM | Admit: 2023-02-26 | Discharge: 2023-03-03 | DRG: 444 | Disposition: A | Discharge status: Home / Self Care | Payer: Medicare HMO | Attending: Internal Medicine | Admitting: Internal Medicine

## 2023-02-26 ENCOUNTER — Encounter (HOSPITAL_COMMUNITY): Payer: Self-pay

## 2023-02-26 DIAGNOSIS — R932 Abnormal findings on diagnostic imaging of liver and biliary tract: Secondary | ICD-10-CM | POA: Diagnosis not present

## 2023-02-26 DIAGNOSIS — Z8673 Personal history of transient ischemic attack (TIA), and cerebral infarction without residual deficits: Secondary | ICD-10-CM

## 2023-02-26 DIAGNOSIS — E785 Hyperlipidemia, unspecified: Secondary | ICD-10-CM | POA: Diagnosis present

## 2023-02-26 DIAGNOSIS — K8050 Calculus of bile duct without cholangitis or cholecystitis without obstruction: Secondary | ICD-10-CM

## 2023-02-26 DIAGNOSIS — K7030 Alcoholic cirrhosis of liver without ascites: Secondary | ICD-10-CM | POA: Diagnosis present

## 2023-02-26 DIAGNOSIS — R791 Abnormal coagulation profile: Secondary | ICD-10-CM | POA: Diagnosis present

## 2023-02-26 DIAGNOSIS — Z96659 Presence of unspecified artificial knee joint: Secondary | ICD-10-CM

## 2023-02-26 DIAGNOSIS — Z89611 Acquired absence of right leg above knee: Secondary | ICD-10-CM

## 2023-02-26 DIAGNOSIS — K851 Biliary acute pancreatitis without necrosis or infection: Secondary | ICD-10-CM | POA: Diagnosis present

## 2023-02-26 DIAGNOSIS — Z820 Family history of epilepsy and other diseases of the nervous system: Secondary | ICD-10-CM

## 2023-02-26 DIAGNOSIS — K219 Gastro-esophageal reflux disease without esophagitis: Secondary | ICD-10-CM | POA: Diagnosis present

## 2023-02-26 DIAGNOSIS — Z87891 Personal history of nicotine dependence: Secondary | ICD-10-CM

## 2023-02-26 DIAGNOSIS — R1084 Generalized abdominal pain: Secondary | ICD-10-CM | POA: Diagnosis not present

## 2023-02-26 DIAGNOSIS — I10 Essential (primary) hypertension: Secondary | ICD-10-CM | POA: Diagnosis present

## 2023-02-26 DIAGNOSIS — K766 Portal hypertension: Secondary | ICD-10-CM | POA: Diagnosis present

## 2023-02-26 DIAGNOSIS — K8021 Calculus of gallbladder without cholecystitis with obstruction: Secondary | ICD-10-CM | POA: Diagnosis not present

## 2023-02-26 DIAGNOSIS — I2510 Atherosclerotic heart disease of native coronary artery without angina pectoris: Secondary | ICD-10-CM | POA: Diagnosis present

## 2023-02-26 DIAGNOSIS — K8063 Calculus of gallbladder and bile duct with acute cholecystitis with obstruction: Principal | ICD-10-CM | POA: Diagnosis present

## 2023-02-26 DIAGNOSIS — N179 Acute kidney failure, unspecified: Secondary | ICD-10-CM | POA: Diagnosis present

## 2023-02-26 DIAGNOSIS — Z833 Family history of diabetes mellitus: Secondary | ICD-10-CM

## 2023-02-26 DIAGNOSIS — D62 Acute posthemorrhagic anemia: Secondary | ICD-10-CM | POA: Diagnosis present

## 2023-02-26 DIAGNOSIS — K8020 Calculus of gallbladder without cholecystitis without obstruction: Secondary | ICD-10-CM | POA: Diagnosis not present

## 2023-02-26 DIAGNOSIS — I8510 Secondary esophageal varices without bleeding: Secondary | ICD-10-CM | POA: Diagnosis present

## 2023-02-26 DIAGNOSIS — K5900 Constipation, unspecified: Secondary | ICD-10-CM | POA: Diagnosis not present

## 2023-02-26 DIAGNOSIS — K7469 Other cirrhosis of liver: Secondary | ICD-10-CM | POA: Diagnosis not present

## 2023-02-26 DIAGNOSIS — Z96651 Presence of right artificial knee joint: Secondary | ICD-10-CM | POA: Diagnosis present

## 2023-02-26 DIAGNOSIS — D509 Iron deficiency anemia, unspecified: Secondary | ICD-10-CM | POA: Diagnosis present

## 2023-02-26 DIAGNOSIS — K821 Hydrops of gallbladder: Secondary | ICD-10-CM | POA: Diagnosis present

## 2023-02-26 DIAGNOSIS — Z8249 Family history of ischemic heart disease and other diseases of the circulatory system: Secondary | ICD-10-CM

## 2023-02-26 DIAGNOSIS — A419 Sepsis, unspecified organism: Secondary | ICD-10-CM | POA: Diagnosis not present

## 2023-02-26 DIAGNOSIS — Z79899 Other long term (current) drug therapy: Secondary | ICD-10-CM

## 2023-02-26 DIAGNOSIS — Z66 Do not resuscitate: Secondary | ICD-10-CM | POA: Diagnosis present

## 2023-02-26 DIAGNOSIS — Z8269 Family history of other diseases of the musculoskeletal system and connective tissue: Secondary | ICD-10-CM

## 2023-02-26 DIAGNOSIS — E876 Hypokalemia: Secondary | ICD-10-CM | POA: Diagnosis present

## 2023-02-26 DIAGNOSIS — S72111A Displaced fracture of greater trochanter of right femur, initial encounter for closed fracture: Secondary | ICD-10-CM | POA: Diagnosis not present

## 2023-02-26 DIAGNOSIS — I252 Old myocardial infarction: Secondary | ICD-10-CM

## 2023-02-26 DIAGNOSIS — F10239 Alcohol dependence with withdrawal, unspecified: Secondary | ICD-10-CM | POA: Diagnosis present

## 2023-02-26 DIAGNOSIS — D638 Anemia in other chronic diseases classified elsewhere: Secondary | ICD-10-CM | POA: Diagnosis present

## 2023-02-26 LAB — COMPREHENSIVE METABOLIC PANEL
ALT: 40 U/L (ref 0–44)
AST: 90 U/L — ABNORMAL HIGH (ref 15–41)
Albumin: 3.4 g/dL — ABNORMAL LOW (ref 3.5–5.0)
Alkaline Phosphatase: 158 U/L — ABNORMAL HIGH (ref 38–126)
Anion gap: 8 (ref 5–15)
BUN: 8 mg/dL (ref 8–23)
CO2: 24 mmol/L (ref 22–32)
Calcium: 8.8 mg/dL — ABNORMAL LOW (ref 8.9–10.3)
Chloride: 106 mmol/L (ref 98–111)
Creatinine, Ser: 0.67 mg/dL (ref 0.61–1.24)
GFR, Estimated: >60 mL/min (ref >60)
Glucose, Bld: 121 mg/dL — ABNORMAL HIGH (ref 70–99)
Potassium: 3.9 mmol/L (ref 3.5–5.1)
Sodium: 138 mmol/L (ref 135–145)
Total Bilirubin: 6.8 mg/dL — ABNORMAL HIGH (ref 0.3–1.2)
Total Protein: 7.5 g/dL (ref 6.5–8.1)

## 2023-02-26 LAB — CBC
HCT: 31.7 % — ABNORMAL LOW (ref 39.0–52.0)
Hemoglobin: 10.8 g/dL — ABNORMAL LOW (ref 13.0–17.0)
MCH: 33.3 pg (ref 26.0–34.0)
MCHC: 34.1 g/dL (ref 30.0–36.0)
MCV: 97.8 fL (ref 80.0–100.0)
Platelets: 192 10*3/uL (ref 150–400)
RBC: 3.24 MIL/uL — ABNORMAL LOW (ref 4.22–5.81)
RDW: 13.6 % (ref 11.5–15.5)
WBC: 5.3 10*3/uL (ref 4.0–10.5)
nRBC: 0 % (ref 0.0–0.2)

## 2023-02-26 LAB — LIPASE, BLOOD: Lipase: 25 U/L (ref 11–51)

## 2023-02-26 MED ORDER — PANTOPRAZOLE SODIUM 40 MG IV SOLR
40.0000 mg | Freq: Once | INTRAVENOUS | Status: AC
  Administered 2023-02-26: 40 mg via INTRAVENOUS
  Filled 2023-02-26: qty 10

## 2023-02-26 MED ORDER — ONDANSETRON HCL 4 MG/2ML IJ SOLN
4.0000 mg | Freq: Once | INTRAMUSCULAR | Status: AC
  Administered 2023-02-26: 4 mg via INTRAVENOUS
  Filled 2023-02-26: qty 2

## 2023-02-26 MED ORDER — HYDROMORPHONE HCL 1 MG/ML IJ SOLN
0.5000 mg | Freq: Once | INTRAMUSCULAR | Status: AC
  Administered 2023-02-26: 0.5 mg via INTRAVENOUS
  Filled 2023-02-26: qty 1

## 2023-02-26 MED ORDER — IOHEXOL 300 MG/ML  SOLN
100.0000 mL | Freq: Once | INTRAMUSCULAR | Status: AC | PRN
  Administered 2023-02-26: 100 mL via INTRAVENOUS

## 2023-02-26 NOTE — ED Notes (Signed)
Pt connected to the monitor with all attachments in place, warm blanket provided, and call bell in reach. No further needs at this time.

## 2023-02-26 NOTE — ED Provider Notes (Signed)
[place] EMERGENCY DEPARTMENT AT [facility] Provider Note   CSN: [id number] Arrival date & time: 02/26/23  2010     History  Chief Complaint  Patient presents with   Abdominal Pain    Mark [name]. is a 67 y.o. male.  Patient complains of epigastric abdominal pain for the last couple days.  Worse in the right upper quadrant.  No vomiting no diarrhea.  Patient has a history of cirrhosis  The history is provided by the patient and medical records. No language interpreter was used.  Abdominal Pain Pain location:  Generalized Pain quality: aching   Pain radiation: Right upper quadrant. Pain severity:  Moderate Onset quality:  Sudden Timing:  Constant Progression:  Worsening Chronicity:  New Context: alcohol use   Relieved by:  Nothing Associated symptoms: no chest pain, no cough, no diarrhea, no fatigue and no hematuria        Home Medications Prior to Admission medications   Medication Sig Start Date End Date Taking? Authorizing Provider  doxycycline (VIBRA-TABS) 100 MG tablet Take 1 tablet (100 mg total) by mouth 2 (two) times daily. 11/11/22   Nadara Mustard, MD  ferrous sulfate 325 (65 FE) MG tablet Take 1 tablet (325 mg total) by mouth 2 (two) times daily with a meal. Take for two weeks as tolerated. Patient taking differently: Take 325 mg by mouth every other day. 08/15/20   Deeann Saint, MD  furosemide (LASIX) 20 MG tablet TAKE 1 TABLET(20 MG) BY MOUTH TWICE DAILY 02/09/23   Deeann Saint, MD  HYDROcodone-acetaminophen (NORCO) 10-325 MG tablet Take 1 tablet by mouth every 4 (four) hours as needed for moderate pain. 11/11/22   Nadara Mustard, MD  HYDROcodone-acetaminophen (NORCO/VICODIN) 5-325 MG tablet Take 1 tablet by mouth every 4 (four) hours as needed for moderate pain. 11/21/22   Adonis Huguenin, NP  naloxone [facility]) nasal spray 4 mg/0.1 mL Use as directed in case of overdose 02/18/23   Claudette Stapler C, PA-C  naproxen sodium (ALEVE)  220 MG tablet Take 440 mg by mouth daily as needed (pain).    [provider]  oxyCODONE-acetaminophen (PERCOCET/ROXICET) 5-325 MG tablet Take 1 tablet by mouth every 4 (four) hours as needed for severe pain. 02/05/23   Nadara Mustard, MD      Allergies    Patient has no known allergies.    Review of Systems   Review of Systems  Constitutional:  Negative for appetite change and fatigue.  HENT:  Negative for congestion, ear discharge and sinus pressure.   Eyes:  Negative for discharge.  Respiratory:  Negative for cough.   Cardiovascular:  Negative for chest pain.  Gastrointestinal:  Positive for abdominal pain. Negative for diarrhea.  Genitourinary:  Negative for frequency and hematuria.  Musculoskeletal:  Negative for back pain.  Skin:  Negative for rash.  Neurological:  Negative for seizures and headaches.  Psychiatric/Behavioral:  Negative for hallucinations.     Physical Exam Updated Vital Signs BP (!) 176/79 (BP Location: Left Arm)   Pulse 95   Temp 98.2 F (36.8 C) (Oral)   Resp 16   Ht 5\' 8"  (1.727 m)   Wt 54.4 kg   SpO2 97%   BMI 18.24 kg/m  Physical Exam Vitals and nursing note reviewed.  Constitutional:      Appearance: He is well-developed.  HENT:     Head: Normocephalic.     Nose: Nose normal.  Eyes:  General: No scleral icterus.    Conjunctiva/sclera: Conjunctivae normal.  Neck:     Thyroid: No thyromegaly.  Cardiovascular:     Rate and Rhythm: Normal rate and regular rhythm.     Heart sounds: No murmur heard.    No friction rub. No gallop.  Pulmonary:     Breath sounds: No stridor. No wheezing or rales.  Chest:     Chest wall: No tenderness.  Abdominal:     General: There is no distension.     Tenderness: There is abdominal tenderness. There is no rebound.  Musculoskeletal:        General: Normal range of motion.     Cervical back: Neck supple.  Lymphadenopathy:     Cervical: No cervical adenopathy.  Skin:    Findings: No  erythema or rash.  Neurological:     Mental Status: He is alert and oriented to person, place, and time.     Motor: No abnormal muscle tone. Weakness: .mil.    Coordination: Coordination normal.  Psychiatric:        Behavior: Behavior normal.     ED Results / Procedures / Treatments   Labs (all labs ordered are listed, but only abnormal results are displayed) Labs Reviewed  COMPREHENSIVE METABOLIC PANEL - Abnormal; Notable for the following components:      Result Value   Glucose, Bld 121 (*)    Calcium 8.8 (*)    Albumin 3.4 (*)    AST 90 (*)    Alkaline Phosphatase 158 (*)    Total Bilirubin 6.8 (*)    All other components within normal limits  CBC - Abnormal; Notable for the following components:   RBC 3.24 (*)    Hemoglobin 10.8 (*)    HCT 31.7 (*)    All other components within normal limits  LIPASE, BLOOD  URINALYSIS, ROUTINE W REFLEX MICROSCOPIC    EKG None  Radiology No results found.  Procedures Procedures    Medications Ordered in ED Medications  pantoprazole (PROTONIX) injection 40 mg (40 mg Intravenous Given 02/26/23 2217)  HYDROmorphone (DILAUDID) injection 0.5 mg (0.5 mg Intravenous Given 02/26/23 2216)  ondansetron (ZOFRAN) injection 4 mg (4 mg Intravenous Given 02/26/23 2215)  iohexol (OMNIPAQUE) 300 MG/ML solution 100 mL (100 mLs Intravenous Contrast Given 02/26/23 2221)    ED Course/ Medical Decision Making/ A&P                                 Medical Decision Making Amount and/or Complexity of Data Reviewed Labs: ordered. Radiology: ordered.  Risk Prescription drug management. Decision regarding hospitalization.     ,mil This patient presents to the ED for concern of abdominal pain, this involves an extensive number of treatment options, and is a complaint that carries with it a high risk of complications and morbidity.  The differential diagnosis includes diverticulitis, gallbladder disease   Co morbidities that complicate the  patient evaluation  Cirrhosis   Additional history obtained:  Additional history obtained from patient External records from outside source obtained and reviewed including hospital records   Lab Tests:  I Ordered, and personally interpreted labs.  The pertinent results include: Hemoglobin 10.8   Imaging Studies ordered:  I ordered imaging studies including CT abdomen I independently visualized and interpreted imaging which showed gallstones I agree with the radiologist interpretation   Cardiac Monitoring: / EKG:  The patient was maintained on a cardiac monitor.  I personally viewed and interpreted the cardiac monitored which showed an underlying rhythm of: Normal sinus rhythm   Consultations Obtained:  I requested consultation with the hospitalist,  and discussed lab and imaging findings as well as pertinent plan - they recommend: Admit, general surgery consult   Problem List / ED Course / Critical interventions / Medication management  Abdominal pain, cirrhosis, gallstones I ordered medication including Dilaudid for pain Reevaluation of the patient after these medicines showed that the patient improved I have reviewed the patients home medicines and have made adjustments as needed   Social Determinants of Health:  None   Test / Admission - Considered:  None   Patient with abdominal pain and history of cirrhosis.        Final Clinical Impression(s) / ED Diagnoses Final diagnoses:  None    Rx / DC Orders ED Discharge Orders     None         Bethann Berkshire, MD 03/02/23 1217

## 2023-02-26 NOTE — ED Triage Notes (Signed)
Pt BIB [facility] d/t ABD pain x 3days with constipation,  Pt had distention all over and no N/V/D

## 2023-02-27 ENCOUNTER — Inpatient Hospital Stay (HOSPITAL_COMMUNITY): Payer: Medicare HMO

## 2023-02-27 DIAGNOSIS — R791 Abnormal coagulation profile: Secondary | ICD-10-CM | POA: Diagnosis not present

## 2023-02-27 DIAGNOSIS — I10 Essential (primary) hypertension: Secondary | ICD-10-CM | POA: Diagnosis not present

## 2023-02-27 DIAGNOSIS — E876 Hypokalemia: Secondary | ICD-10-CM | POA: Diagnosis not present

## 2023-02-27 DIAGNOSIS — K859 Acute pancreatitis without necrosis or infection, unspecified: Secondary | ICD-10-CM | POA: Diagnosis not present

## 2023-02-27 DIAGNOSIS — K7030 Alcoholic cirrhosis of liver without ascites: Secondary | ICD-10-CM | POA: Diagnosis not present

## 2023-02-27 DIAGNOSIS — N183 Chronic kidney disease, stage 3 unspecified: Secondary | ICD-10-CM | POA: Diagnosis not present

## 2023-02-27 DIAGNOSIS — R945 Abnormal results of liver function studies: Secondary | ICD-10-CM | POA: Diagnosis not present

## 2023-02-27 DIAGNOSIS — K838 Other specified diseases of biliary tract: Secondary | ICD-10-CM | POA: Diagnosis not present

## 2023-02-27 DIAGNOSIS — K831 Obstruction of bile duct: Secondary | ICD-10-CM | POA: Diagnosis not present

## 2023-02-27 DIAGNOSIS — Z96651 Presence of right artificial knee joint: Secondary | ICD-10-CM | POA: Diagnosis not present

## 2023-02-27 DIAGNOSIS — D62 Acute posthemorrhagic anemia: Secondary | ICD-10-CM | POA: Diagnosis not present

## 2023-02-27 DIAGNOSIS — K7469 Other cirrhosis of liver: Secondary | ICD-10-CM | POA: Diagnosis not present

## 2023-02-27 DIAGNOSIS — I8510 Secondary esophageal varices without bleeding: Secondary | ICD-10-CM | POA: Diagnosis not present

## 2023-02-27 DIAGNOSIS — R1013 Epigastric pain: Secondary | ICD-10-CM | POA: Diagnosis not present

## 2023-02-27 DIAGNOSIS — Z8673 Personal history of transient ischemic attack (TIA), and cerebral infarction without residual deficits: Secondary | ICD-10-CM | POA: Diagnosis not present

## 2023-02-27 DIAGNOSIS — Z89611 Acquired absence of right leg above knee: Secondary | ICD-10-CM | POA: Diagnosis not present

## 2023-02-27 DIAGNOSIS — D631 Anemia in chronic kidney disease: Secondary | ICD-10-CM | POA: Diagnosis not present

## 2023-02-27 DIAGNOSIS — E785 Hyperlipidemia, unspecified: Secondary | ICD-10-CM | POA: Diagnosis not present

## 2023-02-27 DIAGNOSIS — D509 Iron deficiency anemia, unspecified: Secondary | ICD-10-CM | POA: Diagnosis not present

## 2023-02-27 DIAGNOSIS — K766 Portal hypertension: Secondary | ICD-10-CM | POA: Diagnosis not present

## 2023-02-27 DIAGNOSIS — I2510 Atherosclerotic heart disease of native coronary artery without angina pectoris: Secondary | ICD-10-CM | POA: Diagnosis not present

## 2023-02-27 DIAGNOSIS — K821 Hydrops of gallbladder: Secondary | ICD-10-CM | POA: Diagnosis not present

## 2023-02-27 DIAGNOSIS — D638 Anemia in other chronic diseases classified elsewhere: Secondary | ICD-10-CM | POA: Diagnosis not present

## 2023-02-27 DIAGNOSIS — R935 Abnormal findings on diagnostic imaging of other abdominal regions, including retroperitoneum: Secondary | ICD-10-CM | POA: Diagnosis not present

## 2023-02-27 DIAGNOSIS — K8063 Calculus of gallbladder and bile duct with acute cholecystitis with obstruction: Secondary | ICD-10-CM | POA: Diagnosis not present

## 2023-02-27 DIAGNOSIS — K8021 Calculus of gallbladder without cholecystitis with obstruction: Principal | ICD-10-CM | POA: Diagnosis present

## 2023-02-27 DIAGNOSIS — I129 Hypertensive chronic kidney disease with stage 1 through stage 4 chronic kidney disease, or unspecified chronic kidney disease: Secondary | ICD-10-CM | POA: Diagnosis not present

## 2023-02-27 DIAGNOSIS — Z8249 Family history of ischemic heart disease and other diseases of the circulatory system: Secondary | ICD-10-CM | POA: Diagnosis not present

## 2023-02-27 DIAGNOSIS — R932 Abnormal findings on diagnostic imaging of liver and biliary tract: Secondary | ICD-10-CM | POA: Diagnosis not present

## 2023-02-27 DIAGNOSIS — Z66 Do not resuscitate: Secondary | ICD-10-CM | POA: Diagnosis not present

## 2023-02-27 DIAGNOSIS — Z87891 Personal history of nicotine dependence: Secondary | ICD-10-CM | POA: Diagnosis not present

## 2023-02-27 DIAGNOSIS — F10239 Alcohol dependence with withdrawal, unspecified: Secondary | ICD-10-CM | POA: Diagnosis not present

## 2023-02-27 DIAGNOSIS — K8050 Calculus of bile duct without cholangitis or cholecystitis without obstruction: Secondary | ICD-10-CM | POA: Diagnosis not present

## 2023-02-27 DIAGNOSIS — K851 Biliary acute pancreatitis without necrosis or infection: Secondary | ICD-10-CM | POA: Diagnosis not present

## 2023-02-27 DIAGNOSIS — N179 Acute kidney failure, unspecified: Secondary | ICD-10-CM | POA: Diagnosis not present

## 2023-02-27 DIAGNOSIS — I252 Old myocardial infarction: Secondary | ICD-10-CM | POA: Diagnosis not present

## 2023-02-27 DIAGNOSIS — R1011 Right upper quadrant pain: Secondary | ICD-10-CM | POA: Diagnosis not present

## 2023-02-27 LAB — COMPREHENSIVE METABOLIC PANEL
ALT: 40 U/L (ref 0–44)
AST: 80 U/L — ABNORMAL HIGH (ref 15–41)
Albumin: 3.3 g/dL — ABNORMAL LOW (ref 3.5–5.0)
Alkaline Phosphatase: 160 U/L — ABNORMAL HIGH (ref 38–126)
Anion gap: 16 — ABNORMAL HIGH (ref 5–15)
BUN: 8 mg/dL (ref 8–23)
CO2: 22 mmol/L (ref 22–32)
Calcium: 9 mg/dL (ref 8.9–10.3)
Chloride: 102 mmol/L (ref 98–111)
Creatinine, Ser: 0.58 mg/dL — ABNORMAL LOW (ref 0.61–1.24)
GFR, Estimated: >60 mL/min (ref >60)
Glucose, Bld: 128 mg/dL — ABNORMAL HIGH (ref 70–99)
Potassium: 3.8 mmol/L (ref 3.5–5.1)
Sodium: 140 mmol/L (ref 135–145)
Total Bilirubin: 7.7 mg/dL — ABNORMAL HIGH (ref 0.3–1.2)
Total Protein: 7.5 g/dL (ref 6.5–8.1)

## 2023-02-27 LAB — URINALYSIS, ROUTINE W REFLEX MICROSCOPIC
Bacteria, UA: NONE SEEN
Bilirubin Urine: NEGATIVE
Glucose, UA: NEGATIVE mg/dL
Ketones, ur: NEGATIVE mg/dL
Leukocytes,Ua: NEGATIVE
Nitrite: NEGATIVE
Protein, ur: 30 mg/dL — AB
Specific Gravity, Urine: 1.025 (ref 1.005–1.030)
pH: 7 (ref 5.0–8.0)

## 2023-02-27 LAB — IRON AND TIBC
Iron: 36 ug/dL — ABNORMAL LOW (ref 45–182)
Saturation Ratios: 12 % — ABNORMAL LOW (ref 17.9–39.5)
TIBC: 295 ug/dL (ref 250–450)
UIBC: 259 ug/dL

## 2023-02-27 LAB — HIV ANTIBODY (ROUTINE TESTING W REFLEX): HIV Screen 4th Generation wRfx: NONREACTIVE

## 2023-02-27 LAB — RETICULOCYTES
Immature Retic Fract: 13.5 % (ref 2.3–15.9)
RBC.: 3.38 MIL/uL — ABNORMAL LOW (ref 4.22–5.81)
Retic Count, Absolute: 52.7 10*3/uL (ref 19.0–186.0)
Retic Ct Pct: 1.6 % (ref 0.4–3.1)

## 2023-02-27 LAB — CBC
HCT: 32.6 % — ABNORMAL LOW (ref 39.0–52.0)
Hemoglobin: 11.1 g/dL — ABNORMAL LOW (ref 13.0–17.0)
MCH: 32.8 pg (ref 26.0–34.0)
MCHC: 34 g/dL (ref 30.0–36.0)
MCV: 96.4 fL (ref 80.0–100.0)
Platelets: 185 10*3/uL (ref 150–400)
RBC: 3.38 MIL/uL — ABNORMAL LOW (ref 4.22–5.81)
RDW: 13.6 % (ref 11.5–15.5)
WBC: 6.3 10*3/uL (ref 4.0–10.5)
nRBC: 0 % (ref 0.0–0.2)

## 2023-02-27 LAB — VITAMIN B12: Vitamin B-12: 1298 pg/mL — ABNORMAL HIGH (ref 180–914)

## 2023-02-27 LAB — MAGNESIUM: Magnesium: 1.9 mg/dL (ref 1.7–2.4)

## 2023-02-27 LAB — PROTIME-INR
INR: 1.3 — ABNORMAL HIGH (ref 0.8–1.2)
Prothrombin Time: 15.9 s — ABNORMAL HIGH (ref 11.4–15.2)

## 2023-02-27 LAB — PHOSPHORUS: Phosphorus: 3.2 mg/dL (ref 2.5–4.6)

## 2023-02-27 LAB — FERRITIN: Ferritin: 106 ng/mL (ref 24–336)

## 2023-02-27 MED ORDER — HYDROCHLOROTHIAZIDE 25 MG PO TABS
25.0000 mg | ORAL_TABLET | Freq: Every day | ORAL | Status: DC
  Administered 2023-02-27 – 2023-02-28 (×2): 25 mg via ORAL
  Filled 2023-02-27 (×2): qty 1

## 2023-02-27 MED ORDER — THIAMINE MONONITRATE 100 MG PO TABS
100.0000 mg | ORAL_TABLET | Freq: Every day | ORAL | Status: DC
  Administered 2023-02-27 – 2023-03-03 (×5): 100 mg via ORAL
  Filled 2023-02-27 (×5): qty 1

## 2023-02-27 MED ORDER — THIAMINE HCL 100 MG/ML IJ SOLN
100.0000 mg | Freq: Every day | INTRAMUSCULAR | Status: DC
  Filled 2023-02-27: qty 2

## 2023-02-27 MED ORDER — MELATONIN 5 MG PO TABS
5.0000 mg | ORAL_TABLET | Freq: Every evening | ORAL | Status: DC | PRN
  Administered 2023-02-28 – 2023-03-01 (×3): 5 mg via ORAL
  Filled 2023-02-27 (×3): qty 1

## 2023-02-27 MED ORDER — HYDROMORPHONE HCL 1 MG/ML IJ SOLN
0.5000 mg | INTRAMUSCULAR | Status: DC | PRN
  Administered 2023-02-27 (×3): 0.5 mg via INTRAVENOUS
  Filled 2023-02-27 (×3): qty 0.5

## 2023-02-27 MED ORDER — HYDROMORPHONE HCL 1 MG/ML IJ SOLN
1.0000 mg | INTRAMUSCULAR | Status: DC | PRN
  Administered 2023-02-27 – 2023-02-28 (×4): 1 mg via INTRAVENOUS
  Filled 2023-02-27 (×4): qty 1

## 2023-02-27 MED ORDER — AMLODIPINE BESYLATE 10 MG PO TABS
10.0000 mg | ORAL_TABLET | Freq: Every day | ORAL | Status: DC
  Administered 2023-02-27 – 2023-03-03 (×5): 10 mg via ORAL
  Filled 2023-02-27 (×5): qty 1

## 2023-02-27 MED ORDER — PIPERACILLIN-TAZOBACTAM 3.375 G IVPB 30 MIN
3.3750 g | Freq: Once | INTRAVENOUS | Status: AC
  Administered 2023-02-27: 3.375 g via INTRAVENOUS
  Filled 2023-02-27: qty 50

## 2023-02-27 MED ORDER — GADOBUTROL 1 MMOL/ML IV SOLN
5.0000 mL | Freq: Once | INTRAVENOUS | Status: AC | PRN
  Administered 2023-02-27: 5 mL via INTRAVENOUS

## 2023-02-27 MED ORDER — HYDRALAZINE HCL 20 MG/ML IJ SOLN
5.0000 mg | Freq: Four times a day (QID) | INTRAMUSCULAR | Status: DC | PRN
  Administered 2023-02-27 (×2): 5 mg via INTRAVENOUS
  Filled 2023-02-27 (×2): qty 1

## 2023-02-27 MED ORDER — ENOXAPARIN SODIUM 40 MG/0.4ML IJ SOSY
40.0000 mg | PREFILLED_SYRINGE | INTRAMUSCULAR | Status: DC
  Administered 2023-02-27: 40 mg via SUBCUTANEOUS
  Filled 2023-02-27: qty 0.4

## 2023-02-27 MED ORDER — HYDROMORPHONE HCL 1 MG/ML IJ SOLN
0.5000 mg | Freq: Once | INTRAMUSCULAR | Status: AC
  Administered 2023-02-27: 0.5 mg via INTRAVENOUS
  Filled 2023-02-27: qty 1

## 2023-02-27 MED ORDER — ADULT MULTIVITAMIN W/MINERALS CH
1.0000 | ORAL_TABLET | Freq: Every day | ORAL | Status: DC
  Administered 2023-02-27 – 2023-03-03 (×5): 1 via ORAL
  Filled 2023-02-27 (×5): qty 1

## 2023-02-27 MED ORDER — LORAZEPAM 2 MG/ML IJ SOLN: 1.0000 mg | INTRAMUSCULAR | Status: AC | PRN

## 2023-02-27 MED ORDER — PIPERACILLIN-TAZOBACTAM 3.375 G IVPB
3.3750 g | Freq: Three times a day (TID) | INTRAVENOUS | Status: DC
Start: 2023-02-27 — End: 2023-03-02
  Administered 2023-02-27 – 2023-03-02 (×10): 3.375 g via INTRAVENOUS
  Filled 2023-02-27 (×11): qty 50

## 2023-02-27 MED ORDER — LORAZEPAM 1 MG PO TABS: 1.0000 mg | ORAL_TABLET | ORAL | Status: AC | PRN

## 2023-02-27 MED ORDER — OXYCODONE HCL 5 MG PO TABS
5.0000 mg | ORAL_TABLET | Freq: Four times a day (QID) | ORAL | Status: DC | PRN
  Administered 2023-02-27 – 2023-03-01 (×3): 5 mg via ORAL
  Filled 2023-02-27 (×3): qty 1

## 2023-02-27 MED ORDER — POLYETHYLENE GLYCOL 3350 17 G PO PACK: 17.0000 g | PACK | Freq: Every day | ORAL | Status: DC | PRN

## 2023-02-27 MED ORDER — FOLIC ACID 1 MG PO TABS
1.0000 mg | ORAL_TABLET | Freq: Every day | ORAL | Status: DC
  Administered 2023-02-27 – 2023-03-03 (×5): 1 mg via ORAL
  Filled 2023-02-27 (×5): qty 1

## 2023-02-27 NOTE — H&P (Signed)
History and Physical  [name]. QMV:[id number] DOB: 04-Mar-1956 DOA: 02/26/2023  Referring physician: Dr. Madilyn Hook, EDP  PCP: Deeann Saint, MD  Outpatient Specialists: Orthopedic surgery. Patient coming from: Home.  Chief Complaint: Abdominal pain.  HPI: Mark [name]. is a 67 y.o. male with medical history significant for alcohol abuse, alcoholic cirrhosis, history of CVA, coronary artery disease, anemia of chronic disease, who presents to the ED from home due to 2 days of right upper quadrant pain associated with nausea and vomiting.  Last alcohol consumption was on the day of presentation.  No reported subjective fevers or chills.  In the ED, presented with significant pain requiring several rounds of IV opioid-based analgesics.  Lab studies notable for elevated liver chemistries with T. bili of 6.8.  Contrast-enhanced CT abdomen pelvis revealed the following: Cholelithiasis and nonobstructing stone in the central common bile duct with dilatation of the biliary tree and findings concerning for acute cholecystitis.  Further evaluation with MRCP is recommended.  Cirrhosis with sequela of portal hypertension.  The patient was started on empiric IV antibiotics in the ED.  [facility], hospitalist service, was asked to admit.  ED Course: Temperature 97.7.  BP 198/82, pulse 81, respiratory rate 18, O2 saturation 98% on room air.  Lab studies notable for alkaline phosphatase 158, AST 90, ALT 40, T. bili 6.8.  Hemoglobin 10.8, WBC 5.3, platelet count 192.  Review of Systems: Review of systems as noted in the HPI. All other systems reviewed and are negative.   Past Medical History:  Diagnosis Date   Alcohol abuse    Anemia    Arthritis    Ascites    Cirrhosis ([name])    Coffee ground emesis    Dehydration 06/17/2017   Febrile illness    GERD (gastroesophageal reflux disease)    Heart murmur    History of blood transfusion    Hyperlipidemia    Hypertension    Leg swelling     Myocardial infarction ([name]) 2012   Preop cardiovascular exam 04/14/2013   Sepsis ([name]) 06/17/2017   Septic shock ([name]) 06/18/2017   SIRS (systemic inflammatory response syndrome) ([name]) 07/11/2017   Stroke ([name]) 10/2009   TIA   Thrombocytopenia ([name])    Past Surgical History:  Procedure Laterality Date   AMPUTATION Right 11/07/2022   Procedure: RIGHT ABOVE KNEE AMPUTATION;  Surgeon: Nadara Mustard, MD;  Location: [facility] OR;  Service: Orthopedics;  Laterality: Right;   COLONOSCOPY WITH PROPOFOL N/A 02/07/2016   Procedure: COLONOSCOPY WITH PROPOFOL;  Surgeon: Rachael Fee, MD;  Location: [facility] ENDOSCOPY;  Service: Endoscopy;  Laterality: N/A;   ESOPHAGOGASTRODUODENOSCOPY (EGD) WITH PROPOFOL N/A 02/07/2016   Procedure: ESOPHAGOGASTRODUODENOSCOPY (EGD) WITH PROPOFOL;  Surgeon: Rachael Fee, MD;  Location: [facility] ENDOSCOPY;  Service: Endoscopy;  Laterality: N/A;   ESOPHAGOGASTRODUODENOSCOPY (EGD) WITH PROPOFOL N/A 06/22/2017   Procedure: ESOPHAGOGASTRODUODENOSCOPY (EGD) WITH PROPOFOL;  Surgeon: Beverley Fiedler, MD;  Location: [facility] ENDOSCOPY;  Service: Gastroenterology;  Laterality: N/A;   EXCISIONAL TOTAL KNEE ARTHROPLASTY WITH ANTIBIOTIC SPACERS Right 03/20/2020   Procedure: Resection right total knee arthroplasty and placement of antibiotic spacer;  Surgeon: Durene Romans, MD;  Location: [facility] ORS;  Service: Orthopedics;  Laterality: Right;  90 mins   HERNIA REPAIR Right    inguinal   INGUINAL HERNIA REPAIR Left 02/08/2021   Procedure: OPEN LEFT INGUINAL HERNIA REPAIR WITH MESH;  Surgeon: Abigail Miyamoto, MD;  Location: [facility] OR;  Service: General;  Laterality: Left;   IRRIGATION AND DEBRIDEMENT KNEE  Right 11/13/2020   Procedure: IRRIGATION AND DEBRIDEMENT KNEE;  Surgeon: Durene Romans, MD;  Location: [facility] ORS;  Service: Orthopedics;  Laterality: Right;   JOINT REPLACEMENT     KNEE ARTHROSCOPY     bilateral/  12/14   REIMPLANTATION OF TOTAL KNEE Right 08/02/2020   Procedure: REIMPLANTATION/REVISION OF  TOTAL KNEE WITH REMOVAL OF ANTIBIOTIC SPACER;  Surgeon: Durene Romans, MD;  Location: [facility] ORS;  Service: Orthopedics;  Laterality: Right;    TEE WITHOUT CARDIOVERSION N/A 05/13/2018   Procedure: TRANSESOPHAGEAL ECHOCARDIOGRAM (TEE);  Surgeon: Parke Poisson, MD;  Location: [facility] ENDOSCOPY;  Service: Cardiovascular;  Laterality: N/A;   TOTAL KNEE ARTHROPLASTY Right 05/02/2013   Procedure: RIGHT TOTAL KNEE ARTHROPLASTY;  Surgeon: Shelda Pal, MD;  Location: [facility] ORS;  Service: Orthopedics;  Laterality: Right;    Social History:  reports that he quit smoking about 7 years ago. His smoking use included cigarettes. He started smoking about 17 years ago. He has a 5 pack-year smoking history. He has never used smokeless tobacco. He reports current alcohol use. He reports current drug use. Drug: Marijuana.   No Known Allergies  Family History  Problem Relation Age of Onset   Hypertension Father    Diabetes Father    Dementia Mother    Lupus Sister       Prior to Admission medications   Medication Sig Start Date End Date Taking? Authorizing Provider  doxycycline (VIBRA-TABS) 100 MG tablet Take 1 tablet (100 mg total) by mouth 2 (two) times daily. 11/11/22   Nadara Mustard, MD  ferrous sulfate 325 (65 FE) MG tablet Take 1 tablet (325 mg total) by mouth 2 (two) times daily with a meal. Take for two weeks as tolerated. Patient taking differently: Take 325 mg by mouth every other day. 08/15/20   Deeann Saint, MD  furosemide (LASIX) 20 MG tablet TAKE 1 TABLET(20 MG) BY MOUTH TWICE DAILY 02/09/23   Deeann Saint, MD  HYDROcodone-acetaminophen (NORCO) 10-325 MG tablet Take 1 tablet by mouth every 4 (four) hours as needed for moderate pain. 11/11/22   Nadara Mustard, MD  HYDROcodone-acetaminophen (NORCO/VICODIN) 5-325 MG tablet Take 1 tablet by mouth every 4 (four) hours as needed for moderate pain. 11/21/22   Adonis Huguenin, NP  naloxone [facility]) nasal spray 4 mg/0.1 mL Use as directed in  case of overdose 02/18/23   Claudette Stapler C, PA-C  naproxen sodium (ALEVE) 220 MG tablet Take 440 mg by mouth daily as needed (pain).    [provider]  oxyCODONE-acetaminophen (PERCOCET/ROXICET) 5-325 MG tablet Take 1 tablet by mouth every 4 (four) hours as needed for severe pain. 02/05/23   Nadara Mustard, MD    Physical Exam: BP (!) 198/82 (BP Location: Left Arm)   Pulse 81   Temp 97.7 F (36.5 C) (Oral)   Resp 18   Ht 5\' 8"  (1.727 m)   Wt 54.4 kg   SpO2 98%   BMI 18.24 kg/m   General: 68 y.o. year-old male well developed well nourished in no acute distress.  Alert and oriented x3. Cardiovascular: Regular rate and rhythm with no rubs or gallops.  No thyromegaly or JVD noted.  No lower extremity edema. Respiratory: Clear to auscultation with no wheezes or rales. Good inspiratory effort. Abdomen: Right upper quadrant tenderness with normal bowel sounds x4 quadrants. Muskuloskeletal: No cyanosis, clubbing noted Neuro: CN II-XII intact, strength, sensation, reflexes Skin: No ulcerative lesions noted or rashes Psychiatry: Judgement and insight appear  normal. Mood is appropriate for condition and setting          Labs on Admission:  Basic Metabolic Panel: Recent Labs  Lab 02/26/23 2054  NA 138  K 3.9  CL 106  CO2 24  GLUCOSE 121*  BUN 8  CREATININE 0.67  CALCIUM 8.8*   Liver Function Tests: Recent Labs  Lab 02/26/23 2054  AST 90*  ALT 40  ALKPHOS 158*  BILITOT 6.8*  PROT 7.5  ALBUMIN 3.4*   Recent Labs  Lab 02/26/23 2054  LIPASE 25   No results for input(s): "AMMONIA" in the last 168 hours. CBC: Recent Labs  Lab 02/26/23 2054  WBC 5.3  HGB 10.8*  HCT 31.7*  MCV 97.8  PLT 192   Cardiac Enzymes: No results for input(s): "CKTOTAL", "CKMB", "CKMBINDEX", "TROPONINI" in the last 168 hours.  BNP (last 3 results) No results for input(s): "BNP" in the last 8760 hours.  ProBNP (last 3 results) Recent Labs    03/10/22 1342  PROBNP 542.0*     CBG: No results for input(s): "GLUCAP" in the last 168 hours.  Radiological Exams on Admission: CT ABDOMEN PELVIS W CONTRAST  Result Date: 02/26/2023 CLINICAL DATA:  Abdominal pain. EXAM: CT ABDOMEN AND PELVIS WITH CONTRAST TECHNIQUE: Multidetector CT imaging of the abdomen and pelvis was performed using the standard protocol following bolus administration of intravenous contrast. RADIATION DOSE REDUCTION: This exam was performed according to the departmental dose-optimization program which includes automated exposure control, adjustment of the mA and/or kV according to patient size and/or use of iterative reconstruction technique. CONTRAST:  OMNIPAQUE IOHEXOL 300 MG/ML  SOLN COMPARISON:  CT abdomen pelvis dated 06/17/2017. FINDINGS: Lower chest: There are bibasilar atelectasis. No intra-abdominal free air. Small perihepatic free fluid and small free fluid in the pelvis. Hepatobiliary: There is irregularity of the liver contour suggestive of changes of cirrhosis. There are multiple stones within the gallbladder. The gallbladder is distended. There is a small pericholecystic fluid. Faint curvilinear density within the gallbladder lumen may represent sludge. Gallbladder wall perforation is less likely but not excluded. There is dilatation of the intrahepatic biliary tree and common bile duct. The common bile duct measures approximately 17 mm in diameter. There is a 4 mm stone in the central CBD (37/2). Pancreas: Unremarkable. No pancreatic ductal dilatation or surrounding inflammatory changes. Spleen: Normal in size without focal abnormality. Adrenals/Urinary Tract: The adrenal glands unremarkable. Small left renal inferior pole cyst. There is no hydronephrosis on either side. There is symmetric enhancement and excretion of contrast by both kidneys. The visualized ureters and the urinary bladder appear unremarkable. Stomach/Bowel: There is no bowel obstruction or active inflammation. The appendix is  normal. Vascular/Lymphatic: Advanced aortoiliac atherosclerotic disease. The IVC is unremarkable. No portal venous gas. There is no adenopathy. Para soft tissue varices and dilated portosystemic collateral vein. Reproductive: The prostate and seminal vesicles are grossly unremarkable. No pelvic mass. Other: None Musculoskeletal: Osteopenia with degenerative changes of the spine and hips. Mildly displaced fracture of the greater trochanter of the right femur. IMPRESSION: 1. Cholelithiasis and nonobstructing stone in the central CBD with dilatation of the biliary tree and findings concerning for acute cholecystitis. Further evaluation with MRI/MRCP is recommended. 2. Cirrhosis with sequela of portal hypertension. 3. No bowel obstruction. Normal appendix. 4. Mildly displaced fracture of the greater trochanter of the right femur. 5.  Aortic Atherosclerosis (ICD10-I70.0). Electronically Signed   By: Elgie Collard M.D.   On: 02/26/2023 23:48    EKG: I independently  viewed the EKG done and my findings are as followed: Sinus rhythm rate of 96.  Nonspecific ST-T changes.  QTc 463.  Assessment/Plan Present on Admission:  Cholelithiasis with biliary obstruction  Principal Problem:   Cholelithiasis with biliary obstruction  Cholelithiasis with concern for choledocholithiasis and biliary obstruction Follow MRCP As needed analgesics As needed antiemetics GI consulted for possible ERCP  Acute cholecystitis Started Zosyn in the ED, continue Monitor fever curve and WBCs Follow peripheral blood cultures x 2 As needed analgesics  Alcoholic cirrhosis Avoid hepatotoxic agents Continues with alcohol consumption  Alcohol abuse with concern for withdrawal Recommend complete alcohol cessation. TOC consulted to assist with resources CIWA protocol in place Multivitamin, thiamine and folic acid supplement    Time: 75 minutes.    DVT prophylaxis: Subcu Lovenox daily.  Code Status: DNR.  Family  Communication: None at bedside.  Disposition Plan: Admitted to MedSurg unit.  Consults called: [facility]  Admission status: Inpatient status.   Status is: Inpatient The patient requires at least 2 midnights for further evaluation and treatment of present condition.   Darlin Drop MD [facility] Pager [phone]  If 7PM-7AM, please contact night-coverage [email or web address] Password [email or web address]  02/27/2023, 5:05 AM

## 2023-02-27 NOTE — ED Provider Notes (Signed)
Care assumed at 2300.  Patient with history of cirrhosis and alcohol use disorder here for evaluation of right upper quadrant abdominal pain for 2 days.  Care assumed pending CT abdomen pelvis.  CT concerning for common bile duct obstruction, bilirubin is elevated to 6.8.  He does have ongoing care.  Will start antibiotics for cholecystitis.  Patient updated findings of studies.  Hospitalist consulted for admission for ongoing care.   Tilden Fossa, MD 02/27/23 [phone]

## 2023-02-27 NOTE — Progress Notes (Signed)
  PROGRESS NOTE  Patient admitted earlier this morning. See H&P.   Mark [name]. is a 67 y.o. male with medical history significant for alcohol abuse, alcoholic cirrhosis, history of CVA, coronary artery disease, anemia of chronic disease, who presents to the ED from home due to 2 days of right upper quadrant pain associated with nausea and vomiting.  Last alcohol consumption was on the day of presentation.  No reported subjective fevers or chills.   In the ED, presented with significant pain requiring several rounds of IV opioid-based analgesics.  Lab studies notable for elevated liver chemistries with T. bili of 6.8.  Contrast-enhanced CT abdomen pelvis revealed the following: Cholelithiasis and nonobstructing stone in the central common bile duct with dilatation of the biliary tree and findings concerning for acute cholecystitis.  Further evaluation with MRCP is recommended.  Cirrhosis with sequela of portal hypertension.  Patient was admitted to the hospital, GI consulted.  MRCP ordered.  Patient seen and evaluated, GI NP currently evaluating patient.  Discussed need for MRCP for further plans.  Patient complains of pain.  Cholelithiasis with concern for choledocholithiasis and biliary obstruction, cholecystitis -MRCP pending -GI following -Zosyn -Pain control -Pending GI evaluation and procedure, will need general surgery for cholecystectomy  Alcoholic cirrhosis -GI following  Alcohol abuse with concern for withdrawal -Last drink per report 10/29 -CIWA protocol  Hypertension -Add HCTZ, Norvasc   Status is: Inpatient Remains inpatient appropriate because: IV antibiotics, GI consult pending   [name], DO [facility] 02/27/2023, 12:23 PM  Available via Epic secure chat 7am-7pm After these hours, please refer to coverage provider listed on [email or web address]

## 2023-02-27 NOTE — Consult Note (Addendum)
Consultation  Primary Care Physician:  Deeann Saint, MD Primary Gastroenterologist:  Dr. Christella Hartigan       Reason for Consultation: concern for choledocholithiasis with T. bili of 6.8  DOA: 02/26/2023         Hospital Day: 2         HPI:   Mark [name]. is a 66 y.o. male with past medical history significant for alcohol abuse, alcoholic cirrhosis, history of CVA, coronary artery disease, anemia of chronic disease, who presents to the ED from home due to 2 days of right upper quadrant pain associated with nausea and vomiting.   Work up in ED notable for : AST 90, ALT 40, alk phos 158, total bilirubin 6.8.  BUN 3.4.  BUN 8/creatinine 0.67. Hgb 10.8.  WBC 5.3.  Platelets 192.  Tylenol level less than 10.  Evaded blood pressure 198/82.  Imaging: 02/26/23 CT abdomen pelvis with contrast- shows cholelithiasis and nonobstructing stone in the central CBD with dilatation of the biliary tree and findings concerning for acute cholecystitis.  Further evaluation with MRCP.  Process with sequel of portal hypertension.  Patient last seen in the office on 07/09/2017 by Mark Dike, PA for follow-up after hospitalization with upper GI bleed.  At that time patient was placed on triple therapy for H. Pylori.  Directed to follow-up with Dr. Christella Hartigan in 2 to 3 months, however did not do so.  Patient lying in bed, reports abdominal pain 12/10 out of 0-10 pain scale right upper quadrant.  Reports his symptoms started on Monday with nausea and vomiting and right upper quadrant abdominal pain.  Reports the pain occurs with or without eating.  No fever or chills.  Last bowel movement was yesterday brown in color with no blood per rectum.  Reports dark urine that started yesterday.  No pruritus.  Reports he has not had any follow-ups for liver disease since we saw him in 2019.  Reports he is drinking about 40 ounces of beer daily, last drink was yesterday.  Also reports he smokes tobacco and marijuana couple times  a week.  No IV drug use. No CP, SOB, or dizziness noted.  No family was present at the time of my evaluation.    Previous GI workup:   02/26/23 CT abdomen pelvis with contrast IMPRESSION: 1. Cholelithiasis and nonobstructing stone in the central CBD with dilatation of the biliary tree and findings concerning for acute cholecystitis. Further evaluation with MRI/MRCP is recommended. 2. Cirrhosis with sequela of portal hypertension. 3. No bowel obstruction. Normal appendix. 4. Mildly displaced fracture of the greater trochanter of the right femur. 5.  Aortic Atherosclerosis (ICD10-I70.0).  07/07/2022 Ultrasound abdomen IMPRESSION: No ascites.  06/22/2017 EGD with Dr. Rhea Belton for acute post hemorrhagic anemia on chronic anemia, Coffee- ground emesis, history of cirrhosis with portal hypertension  Impression: - LA Grade A reflux esophagitis. - Small hiatal hernia. - Portal hypertensive gastropathy, mild and non- bleeding. - Acute ulcerative duodenitis ( source of recent hematemesis and blood loss) . - Normal second portion of the duodenum. - No specimens collected.  02/07/2016 EGD and colonoscopy with Dr. Christella Hartigan for Iron deficiency anemia; cirrhosis, continued polypsubstance abuse  EGD Impression: - Grade I esophageal varices. - Portal gastropathy, moderated. - Mild distal gastritis. Biopsied. - Normal examined duodenum.  Colonoscopy Impression: - One 7 mm polyp in the cecum, removed with a cold snare. Resected and retrieved. - External and internal hemorrhoids. - The examination was otherwise normal  on direct and retroflexion views.  Diagnosis Stomach, biopsy, antrum, body HELICOBACTER PYLORI GASTRITIS NEGATIVE FOR ATYPIA OR MALIGNANCY   Past Medical History:  Diagnosis Date   Alcohol abuse    Anemia    Arthritis    Ascites    Cirrhosis ([name])    Coffee ground emesis    Dehydration 06/17/2017   Febrile illness    GERD (gastroesophageal reflux disease)    Heart murmur     History of blood transfusion    Hyperlipidemia    Hypertension    Leg swelling    Myocardial infarction ([name]) 2012   Preop cardiovascular exam 04/14/2013   Sepsis ([name]) 06/17/2017   Septic shock ([name]) 06/18/2017   SIRS (systemic inflammatory response syndrome) ([name]) 07/11/2017   Stroke ([name]) 10/2009   TIA   Thrombocytopenia ([name])     Surgical History:  He  has a past surgical history that includes Knee arthroscopy; Hernia repair (Right); Total knee arthroplasty (Right, 05/02/2013); Colonoscopy with propofol (N/A, 02/07/2016); Esophagogastroduodenoscopy (egd) with propofol (N/A, 02/07/2016); Esophagogastroduodenoscopy (egd) with propofol (N/A, 06/22/2017); TEE without cardioversion (N/A, 05/13/2018); Excisional total knee arthroplasty with antibiotic spacers (Right, 03/20/2020); Reimplantation of total knee (Right, 08/02/2020); Irrigation and debridement knee (Right, 11/13/2020); Inguinal hernia repair (Left, 02/08/2021); Joint replacement; and Amputation (Right, 11/07/2022). Family History:  His family history includes Dementia in his mother; Diabetes in his father; Hypertension in his father; Lupus in his sister. Social History:   reports that he quit smoking about 7 years ago. His smoking use included cigarettes. He started smoking about 17 years ago. He has a 5 pack-year smoking history. He has never used smokeless tobacco. He reports current alcohol use. He reports current drug use. Drug: Marijuana.  Prior to Admission medications   Medication Sig Start Date End Date Taking? Authorizing Provider  doxycycline (VIBRA-TABS) 100 MG tablet Take 1 tablet (100 mg total) by mouth 2 (two) times daily. 11/11/22  Yes Nadara Mustard, MD  furosemide (LASIX) 20 MG tablet TAKE 1 TABLET(20 MG) BY MOUTH TWICE DAILY Patient taking differently: Take 20 mg by mouth 2 (two) times daily. 02/09/23  Yes Deeann Saint, MD  naloxone [facility]) nasal spray 4 mg/0.1 mL Use as directed in case of overdose 02/18/23   Yes Aberman, Caroline C, PA-C  naproxen sodium (ALEVE) 220 MG tablet Take 440 mg by mouth daily as needed (pain).   Yes [provider]  ferrous sulfate 325 (65 FE) MG tablet Take 1 tablet (325 mg total) by mouth 2 (two) times daily with a meal. Take for two weeks as tolerated. Patient taking differently: Take 325 mg by mouth every other day. 08/15/20   Deeann Saint, MD  HYDROcodone-acetaminophen (NORCO) 10-325 MG tablet Take 1 tablet by mouth every 4 (four) hours as needed for moderate pain. 11/11/22   Nadara Mustard, MD  HYDROcodone-acetaminophen (NORCO/VICODIN) 5-325 MG tablet Take 1 tablet by mouth every 4 (four) hours as needed for moderate pain. 11/21/22   Adonis Huguenin, NP  oxyCODONE-acetaminophen (PERCOCET/ROXICET) 5-325 MG tablet Take 1 tablet by mouth every 4 (four) hours as needed for severe pain. 02/05/23   Nadara Mustard, MD    Current Facility-Administered Medications  Medication Dose Route Frequency Provider Last Rate Last Admin   amLODipine (NORVASC) tablet 10 mg  10 mg Oral Daily [name], DO       enoxaparin (LOVENOX) injection 40 mg  40 mg Subcutaneous Q24H Hall, Carole N, DO       folic acid (  FOLVITE) tablet 1 mg  1 mg Oral Daily Hall, Carole N, DO       hydrALAZINE (APRESOLINE) injection 5 mg  5 mg Intravenous Q6H PRN Dow Adolph N, DO       HYDROmorphone (DILAUDID) injection 0.5 mg  0.5 mg Intravenous Q2H PRN Dow Adolph N, DO   0.5 mg at 02/27/23 2956   LORazepam (ATIVAN) tablet 1-4 mg  1-4 mg Oral Q1H PRN Darlin Drop, DO       Or   LORazepam (ATIVAN) injection 1-4 mg  1-4 mg Intravenous Q1H PRN Hall, Carole N, DO       melatonin tablet 5 mg  5 mg Oral QHS PRN Darlin Drop, DO       multivitamin with minerals tablet 1 tablet  1 tablet Oral Daily Dow Adolph N, DO       oxyCODONE (Oxy IR/ROXICODONE) immediate release tablet 5 mg  5 mg Oral Q6H PRN Margo Aye, Carole N, DO       piperacillin-tazobactam (ZOSYN) IVPB 3.375 g  3.375 g Intravenous Q8H Hall,  Carole N, DO       polyethylene glycol (MIRALAX / GLYCOLAX) packet 17 g  17 g Oral Daily PRN Dow Adolph N, DO       thiamine (VITAMIN B1) tablet 100 mg  100 mg Oral Daily Hall, Carole N, DO       Or   thiamine (VITAMIN B1) injection 100 mg  100 mg Intravenous Daily Dow Adolph N, DO        Allergies as of 02/26/2023   (No Known Allergies)    Review of Systems:    Constitutional: No weight loss, fever, chills, weakness or fatigue HEENT: Eyes: No change in vision               Ears, Nose, Throat:  No change in hearing or congestion Skin: No rash or itching Cardiovascular: No chest pain, chest pressure or palpitations   Respiratory: No SOB or cough Gastrointestinal: See HPI and otherwise negative Genitourinary: No dysuria or change in urinary frequency Neurological: No headache, dizziness or syncope Musculoskeletal: No new muscle or joint pain Hematologic: No bleeding or bruising Psychiatric: No history of depression or anxiety     Physical Exam:  Vital signs in last 24 hours: Temp:  [97.6 F (36.4 C)-98.4 F (36.9 C)] 97.6 F (36.4 C) (11/01 0617) Pulse Rate:  [73-95] 73 (11/01 0617) Resp:  [14-18] 18 (11/01 0617) BP: (176-198)/(77-82) 190/77 (11/01 0617) SpO2:  [97 %-100 %] 97 % (11/01 0617) Weight:  [54.4 kg] 54.4 kg (10/31 2025) Last BM Date : 02/26/23 Last BM recorded by nurses in past 5 days No data recorded  General:  African American male , complains of abd pain Head:  Normocephalic and atraumatic. Mouth: White film on tongue Eyes: scleral icterus Heart:  regular rate and rhythm, no murmurs or gallops Pulm: Clear anteriorly; no wheezing Abdomen:  Soft, Non-distended AB, Hypoactive bowel sounds. marked tenderness in the RUQ. Without guarding, Without rebound, and With rebound, No organomegaly appreciated. Extremities:  Without edema in left leg. AKA right Msk:  Symmetrical without gross deformities. Peripheral pulses intact.  Neurologic:  Alert and  oriented  x4;  No focal deficits.  Skin:   Dry and intact without significant lesions or rashes. Psychiatric:  Cooperative. Normal mood and affect.  LAB RESULTS: Recent Labs    02/26/23 2054 02/27/23 0713  WBC 5.3 6.3  HGB 10.8* 11.1*  HCT 31.7* 32.6*  PLT 192  185   BMET Recent Labs    02/26/23 2054  NA 138  K 3.9  CL 106  CO2 24  GLUCOSE 121*  BUN 8  CREATININE 0.67  CALCIUM 8.8*   LFT Recent Labs    02/26/23 2054  PROT 7.5  ALBUMIN 3.4*  AST 90*  ALT 40  ALKPHOS 158*  BILITOT 6.8*   PT/INR No results for input(s): "LABPROT", "INR" in the last 72 hours.  STUDIES: CT ABDOMEN PELVIS W CONTRAST  Result Date: 02/26/2023 CLINICAL DATA:  Abdominal pain. EXAM: CT ABDOMEN AND PELVIS WITH CONTRAST TECHNIQUE: Multidetector CT imaging of the abdomen and pelvis was performed using the standard protocol following bolus administration of intravenous contrast. RADIATION DOSE REDUCTION: This exam was performed according to the departmental dose-optimization program which includes automated exposure control, adjustment of the mA and/or kV according to patient size and/or use of iterative reconstruction technique. CONTRAST:  OMNIPAQUE IOHEXOL 300 MG/ML  SOLN COMPARISON:  CT abdomen pelvis dated 06/17/2017. FINDINGS: Lower chest: There are bibasilar atelectasis. No intra-abdominal free air. Small perihepatic free fluid and small free fluid in the pelvis. Hepatobiliary: There is irregularity of the liver contour suggestive of changes of cirrhosis. There are multiple stones within the gallbladder. The gallbladder is distended. There is a small pericholecystic fluid. Faint curvilinear density within the gallbladder lumen may represent sludge. Gallbladder wall perforation is less likely but not excluded. There is dilatation of the intrahepatic biliary tree and common bile duct. The common bile duct measures approximately 17 mm in diameter. There is a 4 mm stone in the central CBD (37/2). Pancreas:  Unremarkable. No pancreatic ductal dilatation or surrounding inflammatory changes. Spleen: Normal in size without focal abnormality. Adrenals/Urinary Tract: The adrenal glands unremarkable. Small left renal inferior pole cyst. There is no hydronephrosis on either side. There is symmetric enhancement and excretion of contrast by both kidneys. The visualized ureters and the urinary bladder appear unremarkable. Stomach/Bowel: There is no bowel obstruction or active inflammation. The appendix is normal. Vascular/Lymphatic: Advanced aortoiliac atherosclerotic disease. The IVC is unremarkable. No portal venous gas. There is no adenopathy. Para soft tissue varices and dilated portosystemic collateral vein. Reproductive: The prostate and seminal vesicles are grossly unremarkable. No pelvic mass. Other: None Musculoskeletal: Osteopenia with degenerative changes of the spine and hips. Mildly displaced fracture of the greater trochanter of the right femur. IMPRESSION: 1. Cholelithiasis and nonobstructing stone in the central CBD with dilatation of the biliary tree and findings concerning for acute cholecystitis. Further evaluation with MRI/MRCP is recommended. 2. Cirrhosis with sequela of portal hypertension. 3. No bowel obstruction. Normal appendix. 4. Mildly displaced fracture of the greater trochanter of the right femur. 5.  Aortic Atherosclerosis (ICD10-I70.0). Electronically Signed   By: Elgie Collard M.D.   On: 02/26/2023 23:48      Impression /Plan:   67 year old African-American male patient with history of alcohol abuse, alcoholic cirrhosis, history of CVA, coronary artery disease, anemia of chronic disease,  that presents to ED with nausea, vomiting, RUQ pain. CT abd/pelvis shows cholelithiasis and nonobstructing stone in the central CBD with dilatation of the biliary tree and findings concerning for acute cholecystitis.  MRCP pending.  Cholelithiasis with concern for choledocholithiasis and biliary  obstruction  Acute cholecystitis Hyperbilirubinemia RUQ abd pain N/V WBC 6.3 BUN 8/creatinine 0.58 Alk phos 158>160 AST 90>80/ALT 40/Total bilirubin 6.8> 7.7 Lipase 25/Albumin 3.3 Platelets 192>185 02/26/23 CT abdomen pelvis with contrast- shows cholelithiasis and nonobstructing stone in the central CBD with dilatation of the  biliary tree and findings concerning for acute cholecystitis.   -NPO -Monitor LFT's/Total Bili -On Zosyn IV every 8 hours -pending MRCP results, may require ERCP -antiemetics and pain management per hospitalist  Alcoholic cirrhosis, reports last drink was yesterday (10/29) 40oz beer daily 02/26/23 CT abdomen pelvis with contrast- shows there is irregularity of the liver contour suggestive of changes of cirrhosis.Process with sequel of portal hypertension.  No splenomegaly.Para soft tissue varices and dilated portosystemic collateral vein.  07/07/22 Abd u/s showed no ascites. 06/22/2017 Upper Endoscopy- revealed LA Grade A reflux esophagitis.Small hiatal hernia. Portal hypertensive gastropathy, mild and non- bleeding. Acute ulcerative duodenitis. -Check PT/INR - CIWA protocol -Pending results of MRCP, may do further liver workup  -will need outpt follow-up for cirrhosis -Alcohol cessation MELD 3.0: 18 at 02/27/2023  9:48 AM MELD-Na: 17 at 02/27/2023  9:48 AM Calculated from: Serum Creatinine: 0.58 mg/dL (Using min of 1 mg/dL) at 16/04/958  4:54 AM Serum Sodium: 140 mmol/L (Using max of 137 mmol/L) at 02/27/2023  7:13 AM Total Bilirubin: 7.7 mg/dL at 01/03/1190  4:78 AM Serum Albumin: 3.3 g/dL at 29/08/6211  0:86 AM INR(ratio): 1.3 at 02/27/2023  9:48 AM Age at listing (hypothetical): 65 years Sex: Male at 02/27/2023  9:48 AM  MDF based on labs today: Bilirubin 7.7 / PT 15.9/ Control time 13.3 = 20  Acute on chronic anemia, hx of IDA Hemoglobin 10.8> 11.1, previously 9.4 and 02/18/2023.  Baseline is 8-9. -check anemia panel  Tobacco use  Mariajuana  use  Osteopenia  VTE -on Lovenox 40mg     Principal Problem:   Cholelithiasis with biliary obstruction    LOS: 0 days    Thank you for your kind consultation, we will continue to follow.   Deanna J May  02/27/2023, 8:39 AM   Gastroenterology attending:  I have seen and evaluated the patient in person and agree with the nurse practitioner note with the following additions:  Clinical scenario consistent with cholecystitis and common bile duct dilation.  The gallbladder is massively dilated by my reading, and he has significant dilation of the extrahepatic biliary ducts.  I looked at the CT I am not convinced there is a stone in the bile duct but it certainly quite possible.  The MRCP is pending.  Alcoholism and substance abuse in play.  He is at risk for DTs/withdrawal.  He is being covered for that.  I do not think alcoholic hepatitis is necessarily in play i.e. he is not a candidate for any steroids.  Given that he has biliary issues contributing to abnormal liver chemistry and bilirubin the discriminant function is not relevant here though it was calculated above.  I think the same could be said for the child Pugh classification in the MELD probably not accurate due to obstructive jaundice.  At any rate he is at higher risk of problems but INR of 1.3 and normal platelets is encouraging with respect to procedural and operative risk.  Once MRCP is reviewed we can decide the next steps.  ERCP is certainly a possibility.  Dr. Elnoria Howard is covering this weekend.  Continue antibiotics at this time.    Iva Boop, MD, [facility] See Loretha Stapler on call - gastroenterology for best contact person 02/27/2023 4:40 PM

## 2023-02-27 NOTE — TOC Initial Note (Signed)
Transition of Care (TOC) - Initial/Assessment Note   Patient Details  Name: Mark [name]. MRN: [id number] Date of Birth: 24-Jul-1955  Transition of Care [facility]) CM/SW Contact:    Ewing Schlein, LCSW Phone Number: 02/27/2023, 12:18 PM  Clinical Narrative: [facility] consulted for ETOH use resources. CSW spoke with spouse regarding resources. Spouse agreeable to having resources added to AVS. CSW added resources to AVS.  Expected Discharge Plan: Home/Self Care Barriers to Discharge: Continued Medical Work up  Patient Goals and CMS Choice Patient states their goals for this hospitalization and ongoing recovery are:: Return home Choice offered to / list presented to : NA  Expected Discharge Plan and Services In-house Referral: Clinical Social Work Post Acute Care Choice: NA Living arrangements for the past 2 months: Single Family Home              DME Arranged: N/A DME Agency: NA  Prior Living Arrangements/Services Living arrangements for the past 2 months: Single Family Home Lives with:: Spouse Patient language and need for interpreter reviewed:: Yes Need for Family Participation in Patient Care: No (Comment) Care giver support system in place?: Yes (comment) Criminal Activity/Legal Involvement Pertinent to Current Situation/Hospitalization: No - Comment as needed  Activities of Daily Living ADL Screening (condition at time of admission) Independently performs ADLs?: Yes (appropriate for developmental age) Is the patient deaf or have difficulty hearing?: No Does the patient have difficulty seeing, even when wearing glasses/contacts?: No Does the patient have difficulty concentrating, remembering, or making decisions?: No  Emotional Assessment Orientation: : Oriented to Self, Oriented to Place, Oriented to  Time, Oriented to Situation Alcohol / Substance Use: Never Used  Admission diagnosis:  Cholelithiasis with biliary obstruction [K80.21] Patient Active Problem List    Diagnosis Date Noted   Cholelithiasis with biliary obstruction 02/27/2023   Infection of total knee replacement ([name]) 11/07/2022   Hx of AKA (above knee amputation), right ([name]) 11/07/2022   Right knee pain 07/08/2022   Cerebrovascular disease 07/08/2022   Renal cyst 07/08/2022   Protein-calorie malnutrition, severe 07/08/2022   Toxic metabolic encephalopathy 02/26/2022   Septic joint of right knee joint ([name]) 11/08/2020   S/P right TKA reimplantation 08/02/2020   Infection of total left knee replacement ([name]) 03/20/2020   Cirrhosis of liver without ascites ([name])    AKI (acute kidney injury) ([name])    Bacteremia    Spinal stenosis of lumbar region without neurogenic claudication    Aspiration pneumonia ([name]) 09/24/2018   Abscess in epidural space of lumbar spine    MRSA bacteremia 09/21/2018   Infection of prosthetic right knee joint ([name]) 09/20/2018   Anemia of chronic disease 09/20/2018   Hypoalbuminemia 09/20/2018   Hypoglycemia without diagnosis of diabetes mellitus 09/20/2018   Epidural abscess 09/20/2018   Hyperkalemia 05/18/2018   Edentulous 05/11/2018   Pancytopenia ([name]) 05/10/2018   CAD (coronary artery disease) 05/10/2018   Hepatic encephalopathy ([name]) 05/10/2018   Alcohol abuse 05/10/2018   GERD (gastroesophageal reflux disease) 05/10/2018   Duodenal ulcer    Renal failure    Acute on chronic anemia    Hypotension 06/17/2017   Hyponatremia 06/17/2017   Leg edema, right 06/17/2017   Acute metabolic encephalopathy 06/17/2017   Anemia, iron deficiency    Benign neoplasm of ascending colon    Hemorrhoids    Portal hypertensive gastropathy ([name])    Gastritis and gastroduodenitis    Esophageal varices without bleeding ([name])    H/O: CVA (cerebrovascular accident) 12/01/2013   ACS (acute coronary  syndrome) ([name]) 12/01/2013   Anasarca 12/01/2013   Polysubstance abuse ([name]) 12/01/2013   CKD (chronic kidney disease) stage 3, GFR 30-59 ml/min ([name]) 12/01/2013   S/P  right TKA 05/02/2013   Murmur 04/14/2013   Right inguinal hernia 12/26/2010   Cirrhosis, alcoholic ([name]) 12/20/2010   PCP:  Deeann Saint, MD Pharmacy:   [facility] [phone] - [place], [place] - [address] AT [address] [place] [address] Phone: [phone] Fax: [phone]  Social Determinants of Health (SDOH) Social History: SDOH Screenings   Food Insecurity: No Food Insecurity (02/27/2023)  Housing: Low Risk  (02/27/2023)  Transportation Needs: No Transportation Needs (02/27/2023)  Utilities: Not At Risk (02/27/2023)  Alcohol Screen: Low Risk  (03/14/2020)  Depression (PHQ2-9): Low Risk  (08/21/2022)  Financial Resource Strain: Low Risk  (05/16/2021)  Physical Activity: Sufficiently Active (05/16/2021)  Social Connections: Socially Integrated (05/16/2021)  Stress: No Stress Concern Present (05/16/2021)  Tobacco Use: Medium Risk (02/26/2023)   SDOH Interventions:    Readmission Risk Interventions    02/27/2023   12:17 PM 03/03/2022   12:29 PM 08/03/2020   11:48 AM  Readmission Risk Prevention Plan  Transportation Screening Complete Complete Complete  PCP or Specialist Appt within 3-5 Days  Complete   HRI or Home Care Consult Complete Complete Complete  Social Work Consult for Recovery Care Planning/Counseling Complete Complete Complete  Palliative Care Screening Not Applicable Not Applicable Not Applicable  Medication Review Oceanographer) Complete Referral to Pharmacy Complete

## 2023-02-27 NOTE — ED Notes (Signed)
ED TO INPATIENT HANDOFF REPORT  ED Nurse Name and Phone #: Henrique Parekh/[phone]  S Name/Age/Gender Mark [name]. 67 y.o. male Room/Bed: WA17/WA17  Code Status   Code Status: Prior  Home/SNF/Other Home Patient oriented to: self, place, time, and situation Is this baseline? Yes   Triage Complete: Triage complete  Chief Complaint Cholelithiasis with biliary obstruction [K80.21]  Triage Note Pt BIB [facility] d/t ABD pain x 3days with constipation,  Pt had distention all over and no N/V/D   Allergies No Known Allergies  Level of Care/Admitting Diagnosis ED Disposition     ED Disposition  Admit   Condition  --   Comment  Hospital Area: [facility] [[id number]]  Level of Care: Med-Surg [16]  May admit patient to [facility] or [facility] if equivalent level of care is available:: Yes  Covid Evaluation: Asymptomatic - no recent exposure (last 10 days) testing not required  Diagnosis: Cholelithiasis with biliary obstruction [[id number]]  Admitting Physician: Darlin Drop [[id number]]  Attending Physician: Darlin Drop [[id number]]  Certification:: I certify this patient will need inpatient services for at least 2 midnights  Expected Medical Readiness: 03/01/2023          B Medical/Surgery History Past Medical History:  Diagnosis Date   Alcohol abuse    Anemia    Arthritis    Ascites    Cirrhosis ([name])    Coffee ground emesis    Dehydration 06/17/2017   Febrile illness    GERD (gastroesophageal reflux disease)    Heart murmur    History of blood transfusion    Hyperlipidemia    Hypertension    Leg swelling    Myocardial infarction ([name]) 2012   Preop cardiovascular exam 04/14/2013   Sepsis ([name]) 06/17/2017   Septic shock ([name]) 06/18/2017   SIRS (systemic inflammatory response syndrome) ([name]) 07/11/2017   Stroke ([name]) 10/2009   TIA   Thrombocytopenia ([name])    Past Surgical History:  Procedure Laterality Date   AMPUTATION Right 11/07/2022    Procedure: RIGHT ABOVE KNEE AMPUTATION;  Surgeon: Nadara Mustard, MD;  Location: [facility] OR;  Service: Orthopedics;  Laterality: Right;   COLONOSCOPY WITH PROPOFOL N/A 02/07/2016   Procedure: COLONOSCOPY WITH PROPOFOL;  Surgeon: Rachael Fee, MD;  Location: [facility] ENDOSCOPY;  Service: Endoscopy;  Laterality: N/A;   ESOPHAGOGASTRODUODENOSCOPY (EGD) WITH PROPOFOL N/A 02/07/2016   Procedure: ESOPHAGOGASTRODUODENOSCOPY (EGD) WITH PROPOFOL;  Surgeon: Rachael Fee, MD;  Location: [facility] ENDOSCOPY;  Service: Endoscopy;  Laterality: N/A;   ESOPHAGOGASTRODUODENOSCOPY (EGD) WITH PROPOFOL N/A 06/22/2017   Procedure: ESOPHAGOGASTRODUODENOSCOPY (EGD) WITH PROPOFOL;  Surgeon: Beverley Fiedler, MD;  Location: [facility] ENDOSCOPY;  Service: Gastroenterology;  Laterality: N/A;   EXCISIONAL TOTAL KNEE ARTHROPLASTY WITH ANTIBIOTIC SPACERS Right 03/20/2020   Procedure: Resection right total knee arthroplasty and placement of antibiotic spacer;  Surgeon: Durene Romans, MD;  Location: [facility] ORS;  Service: Orthopedics;  Laterality: Right;  90 mins   HERNIA REPAIR Right    inguinal   INGUINAL HERNIA REPAIR Left 02/08/2021   Procedure: OPEN LEFT INGUINAL HERNIA REPAIR WITH MESH;  Surgeon: Abigail Miyamoto, MD;  Location: [facility] OR;  Service: General;  Laterality: Left;   IRRIGATION AND DEBRIDEMENT KNEE Right 11/13/2020   Procedure: IRRIGATION AND DEBRIDEMENT KNEE;  Surgeon: Durene Romans, MD;  Location: [facility] ORS;  Service: Orthopedics;  Laterality: Right;   JOINT REPLACEMENT     KNEE ARTHROSCOPY     bilateral/  12/14   REIMPLANTATION OF TOTAL KNEE Right 08/02/2020  Procedure: REIMPLANTATION/REVISION OF TOTAL KNEE WITH REMOVAL OF ANTIBIOTIC SPACER;  Surgeon: Durene Romans, MD;  Location: [facility] ORS;  Service: Orthopedics;  Laterality: Right;    TEE WITHOUT CARDIOVERSION N/A 05/13/2018   Procedure: TRANSESOPHAGEAL ECHOCARDIOGRAM (TEE);  Surgeon: Parke Poisson, MD;  Location: [facility] ENDOSCOPY;  Service: Cardiovascular;  Laterality: N/A;    TOTAL KNEE ARTHROPLASTY Right 05/02/2013   Procedure: RIGHT TOTAL KNEE ARTHROPLASTY;  Surgeon: Shelda Pal, MD;  Location: [facility] ORS;  Service: Orthopedics;  Laterality: Right;     A IV Location/Drains/Wounds Patient Lines/Drains/Airways Status     Active Line/Drains/Airways     Name Placement date Placement time Site Days   Peripheral IV 02/26/23 20 G 1" Anterior;Distal;Right;Upper Arm 02/26/23  2215  Arm  1   Wound / Incision (Open or Dehisced) 03/01/22 Non-pressure wound Foot Anterior;Left old wound. 03/01/22  1811  Foot  363            Intake/Output Last 24 hours No intake or output data in the 24 hours ending 02/27/23 0136  Labs/Imaging Results for orders placed or performed during the hospital encounter of 02/26/23 (from the past 48 hour(s))  Lipase, blood     Status: None   Collection Time: 02/26/23  8:54 PM  Result Value Ref Range   Lipase 25 11 - 51 U/L    Comment: Performed at [facility], [address], [place], [place] [address]  Comprehensive metabolic panel     Status: Abnormal   Collection Time: 02/26/23  8:54 PM  Result Value Ref Range   Sodium 138 135 - 145 mmol/L   Potassium 3.9 3.5 - 5.1 mmol/L   Chloride 106 98 - 111 mmol/L   CO2 24 22 - 32 mmol/L   Glucose, Bld 121 (H) 70 - 99 mg/dL    Comment: Glucose reference range applies only to samples taken after fasting for at least 8 hours.   BUN 8 8 - 23 mg/dL   Creatinine, Ser 6.04 0.61 - 1.24 mg/dL   Calcium 8.8 (L) 8.9 - 10.3 mg/dL   Total Protein 7.5 6.5 - 8.1 g/dL   Albumin 3.4 (L) 3.5 - 5.0 g/dL   AST 90 (H) 15 - 41 U/L   ALT 40 0 - 44 U/L   Alkaline Phosphatase 158 (H) 38 - 126 U/L   Total Bilirubin 6.8 (H) 0.3 - 1.2 mg/dL   GFR, Estimated >54 >09 mL/min    Comment: (NOTE) Calculated using the CKD-EPI Creatinine Equation (2021)    Anion gap 8 5 - 15    Comment: Performed at [facility], [address] [place], [place] [address]  CBC     Status:  Abnormal   Collection Time: 02/26/23  8:54 PM  Result Value Ref Range   WBC 5.3 4.0 - 10.5 K/uL   RBC 3.24 (L) 4.22 - 5.81 MIL/uL   Hemoglobin 10.8 (L) 13.0 - 17.0 g/dL   HCT 47.8 (L) 29.5 - 62.1 %   MCV 97.8 80.0 - 100.0 fL   MCH 33.3 26.0 - 34.0 pg   MCHC 34.1 30.0 - 36.0 g/dL   RDW 30.8 65.7 - 84.6 %   Platelets 192 150 - 400 K/uL   nRBC 0.0 0.0 - 0.2 %    Comment: Performed at [facility], [address] [place], [place] [address]  Urinalysis, Routine w reflex microscopic -Urine, Clean Catch     Status: Abnormal   Collection Time: 02/26/23 11:46 PM  Result Value Ref Range   Color, Urine AMBER (A) YELLOW    Comment: BIOCHEMICALS MAY BE AFFECTED BY COLOR   APPearance CLEAR CLEAR   Specific Gravity, Urine 1.025 1.005 - 1.030   pH 7.0 5.0 - 8.0   Glucose, UA NEGATIVE NEGATIVE mg/dL   Hgb urine dipstick SMALL (A) NEGATIVE   Bilirubin Urine NEGATIVE NEGATIVE   Ketones, ur NEGATIVE NEGATIVE mg/dL   Protein, ur 30 (A) NEGATIVE mg/dL   Nitrite NEGATIVE NEGATIVE   Leukocytes,Ua NEGATIVE NEGATIVE   RBC / HPF 0-5 0 - 5 RBC/hpf   WBC, UA 0-5 0 - 5 WBC/hpf   Bacteria, UA NONE SEEN NONE SEEN   Squamous Epithelial / HPF 0-5 0 - 5 /HPF   Hyaline Casts, UA PRESENT     Comment: Performed at [facility], [address] [place], [place] [address]   CT ABDOMEN PELVIS W CONTRAST  Result Date: 02/26/2023 CLINICAL DATA:  Abdominal pain. EXAM: CT ABDOMEN AND PELVIS WITH CONTRAST TECHNIQUE: Multidetector CT imaging of the abdomen and pelvis was performed using the standard protocol following bolus administration of intravenous contrast. RADIATION DOSE REDUCTION: This exam was performed according to the departmental dose-optimization program which includes automated exposure control, adjustment of the mA and/or kV according to patient size and/or use of iterative reconstruction technique. CONTRAST:  OMNIPAQUE IOHEXOL 300 MG/ML  SOLN COMPARISON:  CT  abdomen pelvis dated 06/17/2017. FINDINGS: Lower chest: There are bibasilar atelectasis. No intra-abdominal free air. Small perihepatic free fluid and small free fluid in the pelvis. Hepatobiliary: There is irregularity of the liver contour suggestive of changes of cirrhosis. There are multiple stones within the gallbladder. The gallbladder is distended. There is a small pericholecystic fluid. Faint curvilinear density within the gallbladder lumen may represent sludge. Gallbladder wall perforation is less likely but not excluded. There is dilatation of the intrahepatic biliary tree and common bile duct. The common bile duct measures approximately 17 mm in diameter. There is a 4 mm stone in the central CBD (37/2). Pancreas: Unremarkable. No pancreatic ductal dilatation or surrounding inflammatory changes. Spleen: Normal in size without focal abnormality. Adrenals/Urinary Tract: The adrenal glands unremarkable. Small left renal inferior pole cyst. There is no hydronephrosis on either side. There is symmetric enhancement and excretion of contrast by both kidneys. The visualized ureters and the urinary bladder appear unremarkable. Stomach/Bowel: There is no bowel obstruction or active inflammation. The appendix is normal. Vascular/Lymphatic: Advanced aortoiliac atherosclerotic disease. The IVC is unremarkable. No portal venous gas. There is no adenopathy. Para soft tissue varices and dilated portosystemic collateral vein. Reproductive: The prostate and seminal vesicles are grossly unremarkable. No pelvic mass. Other: None Musculoskeletal: Osteopenia with degenerative changes of the spine and hips. Mildly displaced fracture of the greater trochanter of the right femur. IMPRESSION: 1. Cholelithiasis and nonobstructing stone in the central CBD with dilatation of the biliary tree and findings concerning for acute cholecystitis. Further evaluation with MRI/MRCP is recommended. 2. Cirrhosis with sequela of portal  hypertension. 3. No bowel obstruction. Normal appendix. 4. Mildly displaced fracture of the greater trochanter of the right femur. 5.  Aortic Atherosclerosis (ICD10-I70.0). Electronically Signed   By: Elgie Collard M.D.   On: 02/26/2023 23:48    Pending Labs Unresulted Labs (From admission, onward)    None       Vitals/Pain Today's Vitals   02/26/23 2018 02/26/23 2024 02/26/23 2025 02/26/23 2337  BP: (!) 176/79   (!) 180/81  Pulse: 95   90  Resp: 16   14  Temp: 98.2 F (36.8 C)   98.4 F (36.9 C)  TempSrc: Oral   Oral  SpO2: 97% 97%  100%  Weight:   54.4 kg   Height:   5\' 8"  (1.727 m)   PainSc:   10-Worst pain ever     Isolation Precautions No active isolations  Medications Medications  pantoprazole (PROTONIX) injection 40 mg (40 mg Intravenous Given 02/26/23 2217)  HYDROmorphone (DILAUDID) injection 0.5 mg (0.5 mg Intravenous Given 02/26/23 2216)  ondansetron (ZOFRAN) injection 4 mg (4 mg Intravenous Given 02/26/23 2215)  iohexol (OMNIPAQUE) 300 MG/ML solution 100 mL (100 mLs Intravenous Contrast Given 02/26/23 2221)  HYDROmorphone (DILAUDID) injection 0.5 mg (0.5 mg Intravenous Given 02/27/23 0047)  piperacillin-tazobactam (ZOSYN) IVPB 3.375 g (3.375 g Intravenous New Bag/Given 02/27/23 0048)    Mobility power wheelchair/Rt AKA      R Report given to: Angelica Smith,RN

## 2023-02-28 ENCOUNTER — Encounter (HOSPITAL_COMMUNITY): Payer: Self-pay | Admitting: Internal Medicine

## 2023-02-28 ENCOUNTER — Encounter (HOSPITAL_COMMUNITY)
Admission: EM | Disposition: A | Discharge status: Home / Self Care | Payer: Self-pay | Source: Home / Self Care | Attending: Internal Medicine

## 2023-02-28 ENCOUNTER — Inpatient Hospital Stay (HOSPITAL_COMMUNITY): Payer: Medicare HMO

## 2023-02-28 ENCOUNTER — Inpatient Hospital Stay (HOSPITAL_COMMUNITY): Payer: Medicare HMO | Admitting: Anesthesiology

## 2023-02-28 DIAGNOSIS — E876 Hypokalemia: Secondary | ICD-10-CM | POA: Insufficient documentation

## 2023-02-28 DIAGNOSIS — I2510 Atherosclerotic heart disease of native coronary artery without angina pectoris: Secondary | ICD-10-CM

## 2023-02-28 DIAGNOSIS — K8050 Calculus of bile duct without cholangitis or cholecystitis without obstruction: Secondary | ICD-10-CM

## 2023-02-28 DIAGNOSIS — I10 Essential (primary) hypertension: Secondary | ICD-10-CM | POA: Insufficient documentation

## 2023-02-28 HISTORY — PX: ERCP: SHX5425

## 2023-02-28 HISTORY — PX: SPHINCTEROTOMY: SHX5544

## 2023-02-28 HISTORY — PX: REMOVAL OF STONES: SHX5545

## 2023-02-28 LAB — CBC
HCT: 34 % — ABNORMAL LOW (ref 39.0–52.0)
Hemoglobin: 11.5 g/dL — ABNORMAL LOW (ref 13.0–17.0)
MCH: 33 pg (ref 26.0–34.0)
MCHC: 33.8 g/dL (ref 30.0–36.0)
MCV: 97.7 fL (ref 80.0–100.0)
Platelets: 234 10*3/uL (ref 150–400)
RBC: 3.48 MIL/uL — ABNORMAL LOW (ref 4.22–5.81)
RDW: 13.9 % (ref 11.5–15.5)
WBC: 10.2 10*3/uL (ref 4.0–10.5)
nRBC: 0 % (ref 0.0–0.2)

## 2023-02-28 LAB — FOLATE: Folate: 16.2 ng/mL (ref >5.9)

## 2023-02-28 LAB — COMPREHENSIVE METABOLIC PANEL
ALT: 39 U/L (ref 0–44)
AST: 77 U/L — ABNORMAL HIGH (ref 15–41)
Albumin: 3.3 g/dL — ABNORMAL LOW (ref 3.5–5.0)
Alkaline Phosphatase: 155 U/L — ABNORMAL HIGH (ref 38–126)
Anion gap: 14 (ref 5–15)
BUN: 17 mg/dL (ref 8–23)
CO2: 23 mmol/L (ref 22–32)
Calcium: 8.6 mg/dL — ABNORMAL LOW (ref 8.9–10.3)
Chloride: 103 mmol/L (ref 98–111)
Creatinine, Ser: 1.06 mg/dL (ref 0.61–1.24)
GFR, Estimated: >60 mL/min (ref >60)
Glucose, Bld: 82 mg/dL (ref 70–99)
Potassium: 3.4 mmol/L — ABNORMAL LOW (ref 3.5–5.1)
Sodium: 140 mmol/L (ref 135–145)
Total Bilirubin: 8.9 mg/dL — ABNORMAL HIGH (ref 0.3–1.2)
Total Protein: 7.2 g/dL (ref 6.5–8.1)

## 2023-02-28 LAB — BILIRUBIN, DIRECT: Bilirubin, Direct: 5.5 mg/dL — ABNORMAL HIGH (ref 0.0–0.2)

## 2023-02-28 LAB — LIPASE, BLOOD: Lipase: 316 U/L — ABNORMAL HIGH (ref 11–51)

## 2023-02-28 SURGERY — ERCP, WITH INTERVENTION IF INDICATED
Anesthesia: General

## 2023-02-28 MED ORDER — DICLOFENAC SUPPOSITORY 100 MG
RECTAL | Status: AC
Start: 2023-02-28 — End: ?
  Filled 2023-02-28: qty 1

## 2023-02-28 MED ORDER — ROCURONIUM BROMIDE 10 MG/ML (PF) SYRINGE
PREFILLED_SYRINGE | INTRAVENOUS | Status: DC | PRN
  Administered 2023-02-28: 50 mg via INTRAVENOUS

## 2023-02-28 MED ORDER — FENTANYL CITRATE (PF) 100 MCG/2ML IJ SOLN
INTRAMUSCULAR | Status: AC
  Filled 2023-02-28: qty 2

## 2023-02-28 MED ORDER — LABETALOL HCL 5 MG/ML IV SOLN
INTRAVENOUS | Status: DC | PRN
Start: 2023-02-28 — End: 2023-02-28
  Administered 2023-02-28: 5 mg via INTRAVENOUS

## 2023-02-28 MED ORDER — POTASSIUM CHLORIDE 10 MEQ/100ML IV SOLN
10.0000 meq | INTRAVENOUS | Status: AC
  Administered 2023-02-28 (×2): 10 meq via INTRAVENOUS
  Filled 2023-02-28 (×2): qty 100

## 2023-02-28 MED ORDER — ONDANSETRON HCL 4 MG/2ML IJ SOLN
INTRAMUSCULAR | Status: DC | PRN
  Administered 2023-02-28: 4 mg via INTRAVENOUS

## 2023-02-28 MED ORDER — PROPOFOL 10 MG/ML IV BOLUS
INTRAVENOUS | Status: DC | PRN
  Administered 2023-02-28: 100 mg via INTRAVENOUS

## 2023-02-28 MED ORDER — PROPOFOL 10 MG/ML IV BOLUS
INTRAVENOUS | Status: AC
Start: 2023-02-28 — End: ?
  Filled 2023-02-28: qty 20

## 2023-02-28 MED ORDER — PHENYLEPHRINE 80 MCG/ML (10ML) SYRINGE FOR IV PUSH (FOR BLOOD PRESSURE SUPPORT)
PREFILLED_SYRINGE | INTRAVENOUS | Status: DC | PRN
  Administered 2023-02-28: 160 ug via INTRAVENOUS

## 2023-02-28 MED ORDER — EPINEPHRINE 1 MG/10ML IJ SOSY
PREFILLED_SYRINGE | INTRAMUSCULAR | Status: AC
  Filled 2023-02-28: qty 10

## 2023-02-28 MED ORDER — SODIUM CHLORIDE 0.9 % IV SOLN: INTRAVENOUS | Status: DC

## 2023-02-28 MED ORDER — GLUCAGON HCL RDNA (DIAGNOSTIC) 1 MG IJ SOLR
INTRAMUSCULAR | Status: AC
Start: 2023-02-28 — End: ?
  Filled 2023-02-28: qty 1

## 2023-02-28 MED ORDER — DEXAMETHASONE SODIUM PHOSPHATE 4 MG/ML IJ SOLN
INTRAMUSCULAR | Status: DC | PRN
  Administered 2023-02-28: 5 mg via INTRAVENOUS

## 2023-02-28 MED ORDER — MIDAZOLAM HCL 5 MG/5ML IJ SOLN
INTRAMUSCULAR | Status: DC | PRN
  Administered 2023-02-28: 2 mg via INTRAVENOUS

## 2023-02-28 MED ORDER — DICLOFENAC SUPPOSITORY 100 MG
RECTAL | Status: DC | PRN
  Administered 2023-02-28: 100 mg via RECTAL

## 2023-02-28 MED ORDER — SUGAMMADEX SODIUM 200 MG/2ML IV SOLN
INTRAVENOUS | Status: DC | PRN
  Administered 2023-02-28: 200 mg via INTRAVENOUS

## 2023-02-28 MED ORDER — FENTANYL CITRATE (PF) 100 MCG/2ML IJ SOLN
INTRAMUSCULAR | Status: DC | PRN
  Administered 2023-02-28: 100 ug via INTRAVENOUS

## 2023-02-28 MED ORDER — ESMOLOL HCL 100 MG/10ML IV SOLN
INTRAVENOUS | Status: DC | PRN
Start: 2023-02-28 — End: 2023-02-28
  Administered 2023-02-28: 10 mg via INTRAVENOUS

## 2023-02-28 MED ORDER — LIDOCAINE 2% (20 MG/ML) 5 ML SYRINGE
INTRAMUSCULAR | Status: DC | PRN
  Administered 2023-02-28: 100 mg via INTRAVENOUS

## 2023-02-28 MED ORDER — CIPROFLOXACIN IN D5W 400 MG/200ML IV SOLN
INTRAVENOUS | Status: AC
  Filled 2023-02-28: qty 200

## 2023-02-28 MED ORDER — MIDAZOLAM HCL 2 MG/2ML IJ SOLN
INTRAMUSCULAR | Status: AC
  Filled 2023-02-28: qty 2

## 2023-02-28 MED ORDER — SODIUM CHLORIDE 0.9 % IV SOLN
INTRAVENOUS | Status: DC | PRN
  Administered 2023-02-28: 20 mL

## 2023-02-28 NOTE — Interval H&P Note (Signed)
History and Physical Interval Note:  02/28/2023 1:17 PM  [name].  has presented today for surgery, with the diagnosis of Choledocholithiasis, jaundice.  The various methods of treatment have been discussed with the patient and family. After consideration of risks, benefits and other options for treatment, the patient has consented to  Procedure(s): ENDOSCOPIC RETROGRADE CHOLANGIOPANCREATOGRAPHY (ERCP) (N/A) as a surgical intervention.  The patient's history has been reviewed, patient examined, no change in status, stable for surgery.  I have reviewed the patient's chart and labs.  Questions were answered to the patient's satisfaction.    I stressed to the patient that a life-threatening bleed can occur as a result of his portal HTN.  Enlarged blood vessels may be in the ampullary region and a sphincterotomy may cause a life-threatening bleed.  All care will be taken to avoid this problem.  A small sphincterotomy with balloon dilation may be performed.   Wallace Gappa D

## 2023-02-28 NOTE — Anesthesia Procedure Notes (Signed)
Procedure Name: Intubation Date/Time: 02/28/2023 1:15 PM  Performed by: Vanessa Aguada, CRNAPre-anesthesia Checklist: Patient identified, Emergency Drugs available, Suction available and Patient being monitored Patient Re-evaluated:Patient Re-evaluated prior to induction Oxygen Delivery Method: Circle system utilized Preoxygenation: Pre-oxygenation with 100% oxygen Induction Type: IV induction Ventilation: Mask ventilation without difficulty Laryngoscope Size: 3 and Miller Grade View: Grade I Tube type: Oral Tube size: 7.5 mm Number of attempts: 1 Airway Equipment and Method: Stylet and Oral airway Placement Confirmation: ETT inserted through vocal cords under direct vision, positive ETCO2 and breath sounds checked- equal and bilateral Secured at: 21 cm Tube secured with: Tape Dental Injury: Teeth and Oropharynx as per pre-operative assessment

## 2023-02-28 NOTE — Progress Notes (Addendum)
PROGRESS NOTE    Mark [name].  VWU:[id number] DOB: 09-14-1955 DOA: 02/26/2023 PCP: Deeann Saint, MD     Brief Narrative:  Mark Prien. is a 67 y.o. male with medical history significant for alcohol abuse, alcoholic cirrhosis, history of CVA, coronary artery disease, anemia of chronic disease, who presents to the ED from home due to 2 days of right upper quadrant pain associated with nausea and vomiting.  Last alcohol consumption was on the day of presentation.  No reported subjective fevers or chills.   In the ED, presented with significant pain requiring several rounds of IV opioid-based analgesics.  Lab studies notable for elevated liver chemistries with T. bili of 6.8.  Contrast-enhanced CT abdomen pelvis revealed the following: Cholelithiasis and nonobstructing stone in the central common bile duct with dilatation of the biliary tree and findings concerning for acute cholecystitis.  Further evaluation with MRCP is recommended.  Cirrhosis with sequela of portal hypertension.   Patient was admitted to the hospital, GI consulted.  MRCP ordered.  New events last 24 hours / Subjective: Still having significant abdominal pain.  Awaiting to hear back from GI regarding plans  Assessment & Plan:   Principal Problem:   Choledocholithiasis Active Problems:   Cirrhosis, alcoholic ([name])   S/P right TKA   Cholelithiasis with biliary obstruction   HTN (hypertension)   Hypokalemia   Choledocholithiasis and biliary obstruction, acute cholecystitis, acute gallstone pancreatitis  -MRCP showed intrahepatic and extrahepatic biliary dilatation, favoring choledocholithiasis, gallbladder hydrops, thickening of gallbladder wall suspicious for acute cholecystitis, peripancreatic edema and mild.  Pancreatic acute fluid collection -GI following. ERCP today  -Consulted general surgery, spoke with Dr. Andrey Campanile -Zosyn -Lipase 316  -Pain control -IV fluid while n.p.o.   Alcoholic  cirrhosis -GI following   Alcohol abuse with concern for withdrawal -Last drink per report 10/29 -CIWA protocol   Hypertension -HCTZ, Norvasc  Hypokalemia -Replace   DVT prophylaxis:  Place and maintain sequential compression device Start: 02/27/23 1916  Code Status: DNR - NO CPR, DNI. Apparently he had told someone just prior to ERCP that he was not aware that he was DNR. There is documentation from 2020 regarding DNR status and he has been DNR numerous hospitalizations since then. When I went to clarify with him, he was too sleepy post-procedure to have a coherent conversation with me. When I asked his wife, she said as far as she knew, he was a DNR. Will leave code status as DNR for now until I can further clarify with him.  Family Communication: None at bedside; updated wife over the phone Disposition Plan: Home Status is: Inpatient Remains inpatient appropriate because: Pending GI plan    Antimicrobials:  Anti-infectives (From admission, onward)    Start     Dose/Rate Route Frequency Ordered Stop   02/27/23 0800  piperacillin-tazobactam (ZOSYN) IVPB 3.375 g        3.375 g 12.5 mL/hr over 240 Minutes Intravenous Every 8 hours 02/27/23 0511     02/27/23 0030  piperacillin-tazobactam (ZOSYN) IVPB 3.375 g        3.375 g 100 mL/hr over 30 Minutes Intravenous  Once 02/27/23 0024 02/27/23 0140        Objective: Vitals:   02/28/23 0358 02/28/23 0802 02/28/23 0941 02/28/23 0952  BP: (!) 152/64 (!) 146/72 (!) 160/80 (!) 160/80  Pulse: (!) 102 (!) 122 (!) 105   Resp: 18 19 16    Temp: 98.9 F (37.2 C) 99.2 F (37.3 C) 98.9  F (37.2 C)   TempSrc: Oral Oral Oral   SpO2: 99% 98% 96%   Weight:      Height:        Intake/Output Summary (Last 24 hours) at 02/28/2023 1147 Last data filed at 02/28/2023 1000 Gross per 24 hour  Intake 445.15 ml  Output 1100 ml  Net -654.85 ml   Filed Weights   02/26/23 2025  Weight: 54.4 kg    Examination:  General exam: Appears calm   Respiratory system: Clear to auscultation. Respiratory effort normal. No respiratory distress. No conversational dyspnea.  Cardiovascular system: S1 & S2 heard, RRR. No murmurs. No pedal edema. Gastrointestinal system: Abdomen is nondistended, soft and tender to palpation epigastrium Central nervous system: Alert and oriented. No focal neurological deficits. Speech clear.  Skin: No rashes, lesions or ulcers on exposed skin  Psychiatry: Judgement and insight appear normal. Mood & affect appropriate.   Data Reviewed: I have personally reviewed following labs and imaging studies  CBC: Recent Labs  Lab 02/26/23 2054 02/27/23 0713 02/28/23 0456  WBC 5.3 6.3 10.2  HGB 10.8* 11.1* 11.5*  HCT 31.7* 32.6* 34.0*  MCV 97.8 96.4 97.7  PLT 192 185 234   Basic Metabolic Panel: Recent Labs  Lab 02/26/23 2054 02/27/23 0713 02/28/23 0456  NA 138 140 140  K 3.9 3.8 3.4*  CL 106 102 103  CO2 24 22 23   GLUCOSE 121* 128* 82  BUN 8 8 17   CREATININE 0.67 0.58* 1.06  CALCIUM 8.8* 9.0 8.6*  MG  --  1.9  --   PHOS  --  3.2  --    GFR: Estimated Creatinine Clearance: 50.6 mL/min (by C-G formula based on SCr of 1.06 mg/dL). Liver Function Tests: Recent Labs  Lab 02/26/23 2054 02/27/23 0713 02/28/23 0456  AST 90* 80* 77*  ALT 40 40 39  ALKPHOS 158* 160* 155*  BILITOT 6.8* 7.7* 8.9*  PROT 7.5 7.5 7.2  ALBUMIN 3.4* 3.3* 3.3*   Recent Labs  Lab 02/26/23 2054 02/28/23 1012  LIPASE 25 316*   No results for input(s): "AMMONIA" in the last 168 hours. Coagulation Profile: Recent Labs  Lab 02/27/23 0948  INR 1.3*   Cardiac Enzymes: No results for input(s): "CKTOTAL", "CKMB", "CKMBINDEX", "TROPONINI" in the last 168 hours. BNP (last 3 results) Recent Labs    03/10/22 1342  PROBNP 542.0*   HbA1C: No results for input(s): "HGBA1C" in the last 72 hours. CBG: No results for input(s): "GLUCAP" in the last 168 hours. Lipid Profile: No results for input(s): "CHOL", "HDL",  "LDLCALC", "TRIG", "CHOLHDL", "LDLDIRECT" in the last 72 hours. Thyroid Function Tests: No results for input(s): "TSH", "T4TOTAL", "FREET4", "T3FREE", "THYROIDAB" in the last 72 hours. Anemia Panel: Recent Labs    02/27/23 0713 02/27/23 1349 02/28/23 0456  VITAMINB12  --  1,298*  --   FOLATE  --   --  16.2  FERRITIN  --  106  --   TIBC  --  295  --   IRON  --  36*  --   RETICCTPCT 1.6  --   --    Sepsis Labs: No results for input(s): "PROCALCITON", "LATICACIDVEN" in the last 168 hours.  Recent Results (from the past 240 hour(s))  Culture, blood (Routine X 2) w Reflex to ID Panel     Status: None (Preliminary result)   Collection Time: 02/27/23  7:11 AM   Specimen: BLOOD  Result Value Ref Range Status   Specimen Description   Final  BLOOD BLOOD RIGHT HAND Performed at [facility], [address], [place], [place] [address]    Special Requests   Final    BOTTLES DRAWN AEROBIC AND ANAEROBIC Blood Culture adequate volume Performed at [facility], [address], [place], [place] [address]    Culture   Final    NO GROWTH < 24 HOURS Performed at [name], [address], [place], [place] [address]    Report Status PENDING  Incomplete  Culture, blood (Routine X 2) w Reflex to ID Panel     Status: None (Preliminary result)   Collection Time: 02/27/23  7:13 AM   Specimen: BLOOD  Result Value Ref Range Status   Specimen Description   Final    BLOOD BLOOD LEFT ARM Performed at [facility], [address], [place], [place] [address]    Special Requests   Final    BOTTLES DRAWN AEROBIC AND ANAEROBIC Blood Culture adequate volume Performed at [facility], [address] [place], [place] [address]    Culture   Final    NO GROWTH < 24 HOURS Performed at [facility], [address] [place], [place] [address]    Report Status PENDING  Incomplete      Radiology Studies: MR  ABDOMEN MRCP W WO CONTAST  Result Date: 02/27/2023 CLINICAL DATA:  Biliary dilatation with suspected choledocholithiasis EXAM: MRI ABDOMEN WITHOUT AND WITH CONTRAST (INCLUDING MRCP) TECHNIQUE: Multiplanar multisequence MR imaging of the abdomen was performed both before and after the administration of intravenous contrast. Heavily T2-weighted images of the biliary and pancreatic ducts were obtained, and three-dimensional MRCP images were rendered by post processing. Post-processing was applied at the acquisition scanner with concurrent physician supervision which includes 3D reconstructions, MIPs, volume rendered images and/or shaded surface rendering. CONTRAST:  5mL GADAVIST GADOBUTROL 1 MMOL/ML IV SOLN COMPARISON:  CT scan 02/26/2023 FINDINGS: Lower chest: Linear atelectasis or scarring posteriorly in the left lower lobe. Uphill varices along the esophageal margin for example on image 29 series 19. Hepatobiliary: Gallbladder hydrops, 7.2 cm in diameter, with irregularity and some thickening of the gallbladder wall suspicious for acute cholecystitis. Small depending gallstones in the gallbladder as on image 24 series 3. Intrahepatic and extrahepatic biliary dilatation, common hepatic duct 1.4 cm in diameter, common bile duct 1.5 cm in diameter, with dependent small filling defect/debris in the common bile duct favoring choledocholithiasis. The obstructive ampullary stone shown on CT is less obvious on MRI but is faintly appreciable for example on image 34 of series 3, thought to measure 2-3 mm in diameter. There is a small incidental focally dilated segment of the biliary tree in the left hepatic lobe with a saccular morphology on image 41 series 17. Subtle irregularity of the hepatic contour reason the possibility of cirrhosis. No worrisome hepatic mass. Pancreas: Mild dorsal pancreatic duct can dilatation extending to the ampulla. Peripancreatic edema, correlate with lipase levels in assessing for acute  pancreatitis. The peripancreatic stranding and early acute peripancreatic fluid collections are increased from 02/26/2023 CT scan. Spleen:  Unremarkable Adrenals/Urinary Tract: 2.5 cm simple cyst of the left kidney lower pole. No further imaging workup of this lesion is indicated. Adrenal glands unremarkable.  Otherwise normal. Stomach/Bowel: Unremarkable Vascular/Lymphatic: As noted above there are uphill varices along the distal esophagus favoring portal venous hypertension. Dilated inferior mesenteric vein likely with collateralization to the lower IVC. Atherosclerosis is present, including aortoiliac atherosclerotic disease. Other:  Some mild perihepatic  ascites. Musculoskeletal: Fusion at L4-5. IMPRESSION: 1. Substantial intrahepatic and extrahepatic biliary dilatation, with dependent small filling defect/debris in the common bile duct favoring choledocholithiasis. The obstructive ampullary stone shown on CT is less obvious on MRI but is faintly appreciable, thought to measure 2-3 mm in diameter. 2. Gallbladder hydrops, with irregularity and some thickening of the gallbladder wall suspicious for acute cholecystitis. Small dependent gallstones in the gallbladder. 3. Peripancreatic edema and mild peripancreatic acute fluid collections, correlate with lipase levels in assessing for acute pancreatitis. These fluid collections are new compared to yesterday's CT scan. 4. Subtle irregularity of the hepatic contour reason the possibility of cirrhosis. 5. Upstream varices along the distal esophagus favoring portal venous hypertension. Dilated inferior mesenteric vein likely with collateralization to the lower IVC. 6. Mild perihepatic ascites. 7. Aortic Atherosclerosis (ICD10-I70.0). Electronically Signed   By: Gaylyn Rong M.D.   On: 02/27/2023 17:00   MR 3D Recon At Scanner  Result Date: 02/27/2023 CLINICAL DATA:  Biliary dilatation with suspected choledocholithiasis EXAM: MRI ABDOMEN WITHOUT AND WITH  CONTRAST (INCLUDING MRCP) TECHNIQUE: Multiplanar multisequence MR imaging of the abdomen was performed both before and after the administration of intravenous contrast. Heavily T2-weighted images of the biliary and pancreatic ducts were obtained, and three-dimensional MRCP images were rendered by post processing. Post-processing was applied at the acquisition scanner with concurrent physician supervision which includes 3D reconstructions, MIPs, volume rendered images and/or shaded surface rendering. CONTRAST:  5mL GADAVIST GADOBUTROL 1 MMOL/ML IV SOLN COMPARISON:  CT scan 02/26/2023 FINDINGS: Lower chest: Linear atelectasis or scarring posteriorly in the left lower lobe. Uphill varices along the esophageal margin for example on image 29 series 19. Hepatobiliary: Gallbladder hydrops, 7.2 cm in diameter, with irregularity and some thickening of the gallbladder wall suspicious for acute cholecystitis. Small depending gallstones in the gallbladder as on image 24 series 3. Intrahepatic and extrahepatic biliary dilatation, common hepatic duct 1.4 cm in diameter, common bile duct 1.5 cm in diameter, with dependent small filling defect/debris in the common bile duct favoring choledocholithiasis. The obstructive ampullary stone shown on CT is less obvious on MRI but is faintly appreciable for example on image 34 of series 3, thought to measure 2-3 mm in diameter. There is a small incidental focally dilated segment of the biliary tree in the left hepatic lobe with a saccular morphology on image 41 series 17. Subtle irregularity of the hepatic contour reason the possibility of cirrhosis. No worrisome hepatic mass. Pancreas: Mild dorsal pancreatic duct can dilatation extending to the ampulla. Peripancreatic edema, correlate with lipase levels in assessing for acute pancreatitis. The peripancreatic stranding and early acute peripancreatic fluid collections are increased from 02/26/2023 CT scan. Spleen:  Unremarkable  Adrenals/Urinary Tract: 2.5 cm simple cyst of the left kidney lower pole. No further imaging workup of this lesion is indicated. Adrenal glands unremarkable.  Otherwise normal. Stomach/Bowel: Unremarkable Vascular/Lymphatic: As noted above there are uphill varices along the distal esophagus favoring portal venous hypertension. Dilated inferior mesenteric vein likely with collateralization to the lower IVC. Atherosclerosis is present, including aortoiliac atherosclerotic disease. Other:  Some mild perihepatic ascites. Musculoskeletal: Fusion at L4-5. IMPRESSION: 1. Substantial intrahepatic and extrahepatic biliary dilatation, with dependent small filling defect/debris in the common bile duct favoring choledocholithiasis. The obstructive ampullary stone shown on CT is less obvious on MRI but is faintly appreciable, thought to measure 2-3 mm in diameter. 2. Gallbladder hydrops, with irregularity and some thickening of the gallbladder wall suspicious for acute cholecystitis. Small dependent gallstones in the gallbladder. 3.  Peripancreatic edema and mild peripancreatic acute fluid collections, correlate with lipase levels in assessing for acute pancreatitis. These fluid collections are new compared to yesterday's CT scan. 4. Subtle irregularity of the hepatic contour reason the possibility of cirrhosis. 5. Upstream varices along the distal esophagus favoring portal venous hypertension. Dilated inferior mesenteric vein likely with collateralization to the lower IVC. 6. Mild perihepatic ascites. 7. Aortic Atherosclerosis (ICD10-I70.0). Electronically Signed   By: Gaylyn Rong M.D.   On: 02/27/2023 17:00   CT ABDOMEN PELVIS W CONTRAST  Result Date: 02/26/2023 CLINICAL DATA:  Abdominal pain. EXAM: CT ABDOMEN AND PELVIS WITH CONTRAST TECHNIQUE: Multidetector CT imaging of the abdomen and pelvis was performed using the standard protocol following bolus administration of intravenous contrast. RADIATION DOSE  REDUCTION: This exam was performed according to the departmental dose-optimization program which includes automated exposure control, adjustment of the mA and/or kV according to patient size and/or use of iterative reconstruction technique. CONTRAST:  OMNIPAQUE IOHEXOL 300 MG/ML  SOLN COMPARISON:  CT abdomen pelvis dated 06/17/2017. FINDINGS: Lower chest: There are bibasilar atelectasis. No intra-abdominal free air. Small perihepatic free fluid and small free fluid in the pelvis. Hepatobiliary: There is irregularity of the liver contour suggestive of changes of cirrhosis. There are multiple stones within the gallbladder. The gallbladder is distended. There is a small pericholecystic fluid. Faint curvilinear density within the gallbladder lumen may represent sludge. Gallbladder wall perforation is less likely but not excluded. There is dilatation of the intrahepatic biliary tree and common bile duct. The common bile duct measures approximately 17 mm in diameter. There is a 4 mm stone in the central CBD (37/2). Pancreas: Unremarkable. No pancreatic ductal dilatation or surrounding inflammatory changes. Spleen: Normal in size without focal abnormality. Adrenals/Urinary Tract: The adrenal glands unremarkable. Small left renal inferior pole cyst. There is no hydronephrosis on either side. There is symmetric enhancement and excretion of contrast by both kidneys. The visualized ureters and the urinary bladder appear unremarkable. Stomach/Bowel: There is no bowel obstruction or active inflammation. The appendix is normal. Vascular/Lymphatic: Advanced aortoiliac atherosclerotic disease. The IVC is unremarkable. No portal venous gas. There is no adenopathy. Para soft tissue varices and dilated portosystemic collateral vein. Reproductive: The prostate and seminal vesicles are grossly unremarkable. No pelvic mass. Other: None Musculoskeletal: Osteopenia with degenerative changes of the spine and hips. Mildly displaced  fracture of the greater trochanter of the right femur. IMPRESSION: 1. Cholelithiasis and nonobstructing stone in the central CBD with dilatation of the biliary tree and findings concerning for acute cholecystitis. Further evaluation with MRI/MRCP is recommended. 2. Cirrhosis with sequela of portal hypertension. 3. No bowel obstruction. Normal appendix. 4. Mildly displaced fracture of the greater trochanter of the right femur. 5.  Aortic Atherosclerosis (ICD10-I70.0). Electronically Signed   By: Elgie Collard M.D.   On: 02/26/2023 23:48      Scheduled Meds:  amLODipine  10 mg Oral Daily   folic acid  1 mg Oral Daily   hydrochlorothiazide  25 mg Oral Daily   multivitamin with minerals  1 tablet Oral Daily   thiamine  100 mg Oral Daily   Or   thiamine  100 mg Intravenous Daily   Continuous Infusions:  sodium chloride 75 mL/hr at 02/28/23 1000   piperacillin-tazobactam 12.5 mL/hr at 02/28/23 1000     LOS: 1 day   Time spent: 35 minutes   [name], DO [facility] 02/28/2023, 11:47 AM   Available via Epic secure chat 7am-7pm After these hours, please  refer to coverage provider listed on [email or web address]

## 2023-02-28 NOTE — Progress Notes (Signed)
Gastroenterology Progress Note/covering for Dr. Loreta Ave and Ames Dura. 67 y.o. Feb 02, 1956  CC: Choledocholithiasis, cholecystitis   Subjective: Patient seen and examined at bedside.  He continues to have right upper quadrant abdominal pain.  Some nausea.  Denies any vomiting.  Denies diarrhea or constipation.  Wants to go home.  ROS : Afebrile, negative for chest pain   Objective: Vital signs in last 24 hours: Vitals:   02/28/23 0941 02/28/23 0952  BP: (!) 160/80 (!) 160/80  Pulse: (!) 105   Resp: 16   Temp: 98.9 F (37.2 C)   SpO2: 96%     Physical Exam:  General:  Alert, cooperative, no distress, appears stated age  Head:  Normocephalic, without obvious abnormality, atraumatic  Eyes:  Scleral icterus noted  Lungs:   No visible respiratory distress  Heart:  Regular rate and rhythm, S1, S2 normal  Abdomen:   Right upper quadrant tenderness to palpation, abdomen is otherwise soft, bowel sound present, no peritoneal signs  Extremities: Extremities normal, atraumatic, no  edema  Pulses: 2+ and symmetric    Lab Results: Recent Labs    02/27/23 0713 02/28/23 0456  NA 140 140  K 3.8 3.4*  CL 102 103  CO2 22 23  GLUCOSE 128* 82  BUN 8 17  CREATININE 0.58* 1.06  CALCIUM 9.0 8.6*  MG 1.9  --   PHOS 3.2  --    Recent Labs    02/27/23 0713 02/28/23 0456  AST 80* 77*  ALT 40 39  ALKPHOS 160* 155*  BILITOT 7.7* 8.9*  PROT 7.5 7.2  ALBUMIN 3.3* 3.3*   Recent Labs    02/27/23 0713 02/28/23 0456  WBC 6.3 10.2  HGB 11.1* 11.5*  HCT 32.6* 34.0*  MCV 96.4 97.7  PLT 185 234   Recent Labs    02/27/23 0948  LABPROT 15.9*  INR 1.3*      Assessment/Plan: -Right upper quadrant abdominal pain with MRI confirming choledocholithiasis. -Acute pancreatitis.  Biliary pancreatitis from above. -Cirrhosis with portal hypertension with possible esophageal varices based on imaging finding.  Patient with mild elevated INR.  Normal platelet count.  Difficult  to calculate MELD score with obstructive jaundice. -Possible acute cholecystitis. -Abnormal LFTs with jaundice.  Likely from choledocholithiasis.  Recommendations ------------------------ -Case discussed with Dr. Elnoria Howard.  Tentative plan for ERCP today.  Continue antibiotics for now.  He has not received his prophylactic Lovenox today.  Discussed with RN to hold off on prophylaxis Lovenox for now. -Patient will likely need cholecystectomy.  Discussed with hospitalist to arrange for surgery consult. -Recommend outpatient workup of cirrhosis once acute issues are resolved. -GI will follow.  Risks (post ERCP pancreatitis, bleeding, infection, bowel perforation that could require surgery, sedation-related changes in cardiopulmonary systems), benefits (identification and possible treatment of source of symptoms, exclusion of certain causes of symptoms), and alternatives (watchful waiting, radiographic imaging studies, empiric medical treatment)  were explained to patient/family in detail and patient wishes to proceed.    Kathi Der MD, FACP 02/28/2023, 11:41 AM  Contact #  [phone]

## 2023-02-28 NOTE — Anesthesia Preprocedure Evaluation (Addendum)
Anesthesia Evaluation  Patient identified by MRN, date of birth, ID band Patient awake    Reviewed: Allergy & Precautions, NPO status , Patient's Chart, lab work & pertinent test results  History of Anesthesia Complications Negative for: history of anesthetic complications  Airway Mallampati: II  TM Distance: >3 FB Neck ROM: Full    Dental  (+) Edentulous Upper, Edentulous Lower,    Pulmonary former smoker   Pulmonary exam normal        Cardiovascular hypertension, + CAD and + Past MI  Normal cardiovascular exam+ dysrhythmias Supra Ventricular Tachycardia    Echo 09/22/2018 IMPRESSIONS  1. The left ventricle has normal systolic function with an ejection fraction of 60-65%. The cavity size was normal. There is mildly increased left ventricular wall thickness. Left ventricular diastolic parameters were normal.  2. The right ventricle has normal systolic function. The cavity was normal. There is no increase in right ventricular wall thickness.  3. Left atrial size was severely dilated.  4. The mitral valve is normal in structure. Mitral valve regurgitation is mild by color flow Doppler. No evidence of mitral valve stenosis. 5. Mild thickening of the aortic valve. Mild calcification of the aortic valve. No stenosis of the aortic valve.    Neuro/Psych TIACVA    GI/Hepatic PUD,GERD  ,,(+) Cirrhosis   ascites  substance abuse  alcohol use and marijuana use  Endo/Other  negative endocrine ROS    Renal/GU CRFRenal disease  negative genitourinary   Musculoskeletal  (+) Arthritis ,    Abdominal   Peds  Hematology  (+) Blood dyscrasia, anemia Hgb 8.2, plts 153   Anesthesia Other Findings    Reproductive/Obstetrics                             Anesthesia Physical Anesthesia Plan  ASA: 4 and emergent  Anesthesia Plan: General   Post-op Pain Management: GA combined w/ [facility] for post-op  pain   Induction: Intravenous  PONV Risk Score and Plan: 2 and Ondansetron, Dexamethasone and Treatment may vary due to age or medical condition  Airway Management Planned: Oral ETT  Additional Equipment: None  Intra-op Plan:   Post-operative Plan: Extubation in OR  Informed Consent: I have reviewed the patients History and Physical, chart, labs and discussed the procedure including the risks, benefits and alternatives for the proposed anesthesia with the patient or authorized representative who has indicated his/her understanding and acceptance.    Discussed DNR with patient and Suspend DNR.   Dental advisory given  Plan Discussed with: Anesthesiologist  Anesthesia Plan Comments: (Brief Narrative:  Mark [name]. is a 67 y.o. male with medical history significant for alcohol abuse, alcoholic cirrhosis, history of CVA, coronary artery disease, anemia of chronic disease, who presents to the ED from home due to 2 days of right upper quadrant pain associated with nausea and vomiting.  Last alcohol consumption was on the day of presentation.  No reported subjective fevers or chills.   In the ED, presented with significant pain requiring several rounds of IV opioid-based analgesics.  Lab studies notable for elevated liver chemistries with T. bili of 6.8.  Contrast-enhanced CT abdomen pelvis revealed the following: Cholelithiasis and nonobstructing stone in the central common bile duct with dilatation of the biliary tree and findings concerning for acute cholecystitis.  Further evaluation with MRCP is recommended.  Cirrhosis with sequela of portal hypertension.   Patient was admitted to the hospital, GI consulted.  MRCP ordered.   New events last 24 hours / Subjective: Still having significant abdominal pain.  Awaiting to hear back from GI regarding plans   Assessment & Plan:   Principal Problem:   Choledocholithiasis Active Problems:   Cirrhosis, alcoholic ([name])   S/P right  TKA   Cholelithiasis with biliary obstruction   HTN (hypertension)   Hypokalemia    Choledocholithiasis and biliary obstruction, acute cholecystitis, acute gallstone pancreatitis  -MRCP showed intrahepatic and extrahepatic biliary dilatation, favoring choledocholithiasis, gallbladder hydrops, thickening of gallbladder wall suspicious for acute cholecystitis, peripancreatic edema and mild.  Pancreatic acute fluid collection -GI following. ERCP today  -Consulted general surgery, spoke with Dr. Andrey Campanile -Zosyn -Lipase 316  -Pain control -IV fluid while n.p.o.   Alcoholic cirrhosis -GI following   Alcohol abuse with concern for withdrawal -Last drink per report 10/29 -CIWA protocol   Hypertension -HCTZ, Norvasc   Hypokalemia -Replace   )        Anesthesia Quick Evaluation

## 2023-02-28 NOTE — Transfer of Care (Signed)
Immediate Anesthesia Transfer of Care Note  Patient: Mark [name].  Procedure(s) Performed: ENDOSCOPIC RETROGRADE CHOLANGIOPANCREATOGRAPHY (ERCP)  Patient Location: PACU  Anesthesia Type:General  Level of Consciousness: awake and patient cooperative  Airway & Oxygen Therapy: Patient Spontanous Breathing and Patient connected to face mask  Post-op Assessment: Report given to RN and Post -op Vital signs reviewed and stable  Post vital signs: Reviewed and stable  Last Vitals:  Vitals Value Taken Time  BP 129/72 02/28/23 1411  Temp    Pulse 88 02/28/23 1411  Resp 12 02/28/23 1411  SpO2 98 % 02/28/23 1411  Vitals shown include unfiled device data.  Last Pain:  Vitals:   02/28/23 1409  TempSrc:   PainSc: 0-No pain      Patients Stated Pain Goal: 2 (02/28/23 0000)  Complications: No notable events documented.

## 2023-02-28 NOTE — Consult Note (Addendum)
CC: abdominal pain  Requesting provider: Dr Alvino Chapel  HPI: Bence Trapp. is an 67 y.o. male with a history of alcohol abuse, history of CVA, coronary artery disease, anemia of chronic disease who presented to the emergency room a few days ago and was admitted for elevated LFTs abdominal pain.  He states his abdominal pain started last Friday.  The pain is also in the upper abdomen as well.  It was associate with nausea and vomiting.  His workup thus far has revealed choledocholithiasis, pancreatitis with peripancreatic fluid collections, cirrhosis, and varices.  He is currently scheduled to undergo ERCP later today.  He states he still having abdominal pain.  He has had right above-knee amputation.  He has a remote history of an upper GI bleed in 2019.  Past Medical History:  Diagnosis Date   Alcohol abuse    Anemia    Arthritis    Ascites    Cirrhosis ([name])    Coffee ground emesis    Dehydration 06/17/2017   Febrile illness    GERD (gastroesophageal reflux disease)    Heart murmur    History of blood transfusion    Hyperlipidemia    Hypertension    Leg swelling    Myocardial infarction ([name]) 2012   Preop cardiovascular exam 04/14/2013   Sepsis ([name]) 06/17/2017   Septic shock ([name]) 06/18/2017   SIRS (systemic inflammatory response syndrome) ([name]) 07/11/2017   Stroke ([name]) 10/2009   TIA   Thrombocytopenia ([name])     Past Surgical History:  Procedure Laterality Date   AMPUTATION Right 11/07/2022   Procedure: RIGHT ABOVE KNEE AMPUTATION;  Surgeon: Nadara Mustard, MD;  Location: [facility] OR;  Service: Orthopedics;  Laterality: Right;   COLONOSCOPY WITH PROPOFOL N/A 02/07/2016   Procedure: COLONOSCOPY WITH PROPOFOL;  Surgeon: Rachael Fee, MD;  Location: [facility] ENDOSCOPY;  Service: Endoscopy;  Laterality: N/A;   ESOPHAGOGASTRODUODENOSCOPY (EGD) WITH PROPOFOL N/A 02/07/2016   Procedure: ESOPHAGOGASTRODUODENOSCOPY (EGD) WITH PROPOFOL;  Surgeon: Rachael Fee, MD;  Location: [facility]  ENDOSCOPY;  Service: Endoscopy;  Laterality: N/A;   ESOPHAGOGASTRODUODENOSCOPY (EGD) WITH PROPOFOL N/A 06/22/2017   Procedure: ESOPHAGOGASTRODUODENOSCOPY (EGD) WITH PROPOFOL;  Surgeon: Beverley Fiedler, MD;  Location: [facility] ENDOSCOPY;  Service: Gastroenterology;  Laterality: N/A;   EXCISIONAL TOTAL KNEE ARTHROPLASTY WITH ANTIBIOTIC SPACERS Right 03/20/2020   Procedure: Resection right total knee arthroplasty and placement of antibiotic spacer;  Surgeon: Durene Romans, MD;  Location: [facility] ORS;  Service: Orthopedics;  Laterality: Right;  90 mins   HERNIA REPAIR Right    inguinal   INGUINAL HERNIA REPAIR Left 02/08/2021   Procedure: OPEN LEFT INGUINAL HERNIA REPAIR WITH MESH;  Surgeon: Abigail Miyamoto, MD;  Location: [facility] OR;  Service: General;  Laterality: Left;   IRRIGATION AND DEBRIDEMENT KNEE Right 11/13/2020   Procedure: IRRIGATION AND DEBRIDEMENT KNEE;  Surgeon: Durene Romans, MD;  Location: [facility] ORS;  Service: Orthopedics;  Laterality: Right;   JOINT REPLACEMENT     KNEE ARTHROSCOPY     bilateral/  12/14   REIMPLANTATION OF TOTAL KNEE Right 08/02/2020   Procedure: REIMPLANTATION/REVISION OF TOTAL KNEE WITH REMOVAL OF ANTIBIOTIC SPACER;  Surgeon: Durene Romans, MD;  Location: [facility] ORS;  Service: Orthopedics;  Laterality: Right;    TEE WITHOUT CARDIOVERSION N/A 05/13/2018   Procedure: TRANSESOPHAGEAL ECHOCARDIOGRAM (TEE);  Surgeon: Parke Poisson, MD;  Location: [facility] ENDOSCOPY;  Service: Cardiovascular;  Laterality: N/A;   TOTAL KNEE ARTHROPLASTY Right 05/02/2013   Procedure: RIGHT TOTAL KNEE ARTHROPLASTY;  Surgeon: Madlyn Frankel  Charlann Boxer, MD;  Location: [facility] ORS;  Service: Orthopedics;  Laterality: Right;    Family History  Problem Relation Age of Onset   Hypertension Father    Diabetes Father    Dementia Mother    Lupus Sister     Social:  reports that he quit smoking about 7 years ago. His smoking use included cigarettes. He started smoking about 17 years ago. He has a 5 pack-year smoking  history. He has never used smokeless tobacco. He reports current alcohol use. He reports current drug use. Drug: Marijuana.  Allergies: No Known Allergies  Medications: I have reviewed the patient's current medications.   ROS - all of the below systems have been reviewed with the patient and positives are indicated with bold text General: chills, fever or night sweats Eyes: blurry vision or double vision ENT: epistaxis or sore throat Allergy/Immunology: itchy/watery eyes or nasal congestion Hematologic/Lymphatic: bleeding problems, blood clots or swollen lymph nodes Endocrine: temperature intolerance or unexpected weight changes Breast: new or changing breast lumps or nipple discharge Resp: cough, shortness of breath, or wheezing CV: chest pain or dyspnea on exertion GI: as per HPI GU: dysuria, trouble voiding, or hematuria MSK: joint pain or joint stiffness Neuro: TIA or stroke symptoms Derm: pruritus and skin lesion changes Psych: anxiety and depression  PE Blood pressure (!) 160/80, pulse (!) 105, temperature 98.9 F (37.2 C), temperature source Oral, resp. rate 16, height 5\' 8"  (1.727 m), weight 54.4 kg, SpO2 96%. Constitutional: NAD; conversant; no deformities; somewhat cachectic in appearance Eyes: Moist conjunctiva; no lid lag; jaundiced; PERRL Neck: Trachea midline; no thyromegaly Lungs: Normal respiratory effort; no tactile fremitus CV: RRR; no palpable thrills; no pitting edema GI: Abd soft, tender in epigastrium and right upper quadrant; no palpable hepatosplenomegaly MSK: ; no clubbing/cyanosis; right above-knee amputation Psychiatric: Appropriate affect; alert and oriented x3 Lymphatic: No palpable cervical or axillary lymphadenopathy Skin: No lesions  Results for orders placed or performed during the hospital encounter of 02/26/23 (from the past 48 hour(s))  Lipase, blood     Status: None   Collection Time: 02/26/23  8:54 PM  Result Value Ref Range   Lipase 25  11 - 51 U/L    Comment: Performed at [facility], [address] [place], [place] [address]  Comprehensive metabolic panel     Status: Abnormal   Collection Time: 02/26/23  8:54 PM  Result Value Ref Range   Sodium 138 135 - 145 mmol/L   Potassium 3.9 3.5 - 5.1 mmol/L   Chloride 106 98 - 111 mmol/L   CO2 24 22 - 32 mmol/L   Glucose, Bld 121 (H) 70 - 99 mg/dL    Comment: Glucose reference range applies only to samples taken after fasting for at least 8 hours.   BUN 8 8 - 23 mg/dL   Creatinine, Ser 1.91 0.61 - 1.24 mg/dL   Calcium 8.8 (L) 8.9 - 10.3 mg/dL   Total Protein 7.5 6.5 - 8.1 g/dL   Albumin 3.4 (L) 3.5 - 5.0 g/dL   AST 90 (H) 15 - 41 U/L   ALT 40 0 - 44 U/L   Alkaline Phosphatase 158 (H) 38 - 126 U/L   Total Bilirubin 6.8 (H) 0.3 - 1.2 mg/dL   GFR, Estimated >47 >82 mL/min    Comment: (NOTE) Calculated using the CKD-EPI Creatinine Equation (2021)    Anion gap 8 5 - 15    Comment: Performed at [facility], [address] [place], [place] [address]  CBC     Status: Abnormal   Collection Time: 02/26/23  8:54 PM  Result Value Ref Range   WBC 5.3 4.0 - 10.5 K/uL   RBC 3.24 (L) 4.22 - 5.81 MIL/uL   Hemoglobin 10.8 (L) 13.0 - 17.0 g/dL   HCT 25.3 (L) 66.4 - 40.3 %   MCV 97.8 80.0 - 100.0 fL   MCH 33.3 26.0 - 34.0 pg   MCHC 34.1 30.0 - 36.0 g/dL   RDW 47.4 25.9 - 56.3 %   Platelets 192 150 - 400 K/uL   nRBC 0.0 0.0 - 0.2 %    Comment: Performed at [facility], [address] [place], [place] [address]  Urinalysis, Routine w reflex microscopic -Urine, Clean Catch     Status: Abnormal   Collection Time: 02/26/23 11:46 PM  Result Value Ref Range   Color, Urine AMBER (A) YELLOW    Comment: BIOCHEMICALS MAY BE AFFECTED BY COLOR   APPearance CLEAR CLEAR   Specific Gravity, Urine 1.025 1.005 - 1.030   pH 7.0 5.0 - 8.0   Glucose, UA NEGATIVE NEGATIVE mg/dL   Hgb urine dipstick SMALL (A) NEGATIVE   Bilirubin Urine  NEGATIVE NEGATIVE   Ketones, ur NEGATIVE NEGATIVE mg/dL   Protein, ur 30 (A) NEGATIVE mg/dL   Nitrite NEGATIVE NEGATIVE   Leukocytes,Ua NEGATIVE NEGATIVE   RBC / HPF 0-5 0 - 5 RBC/hpf   WBC, UA 0-5 0 - 5 WBC/hpf   Bacteria, UA NONE SEEN NONE SEEN   Squamous Epithelial / HPF 0-5 0 - 5 /HPF   Hyaline Casts, UA PRESENT     Comment: Performed at [facility], [address] [place], [place] [address]  Culture, blood (Routine X 2) w Reflex to ID Panel     Status: None (Preliminary result)   Collection Time: 02/27/23  7:11 AM   Specimen: BLOOD  Result Value Ref Range   Specimen Description      BLOOD BLOOD RIGHT HAND Performed at [facility], [address], [place], [place] [address]    Special Requests      BOTTLES DRAWN AEROBIC AND ANAEROBIC Blood Culture adequate volume Performed at [facility], [address] [place], [place] [address]    Culture      NO GROWTH < 24 HOURS Performed at [facility], [address] [place], [place] [address]    Report Status PENDING   HIV Antibody (routine testing w rflx)     Status: None   Collection Time: 02/27/23  7:13 AM  Result Value Ref Range   HIV Screen 4th Generation wRfx Non Reactive Non Reactive    Comment: Performed at [facility], [address] [place], [place] [address]  CBC     Status: Abnormal   Collection Time: 02/27/23  7:13 AM  Result Value Ref Range   WBC 6.3 4.0 - 10.5 K/uL   RBC 3.38 (L) 4.22 - 5.81 MIL/uL   Hemoglobin 11.1 (L) 13.0 - 17.0 g/dL   HCT 55.7 (L) 32.2 - 02.5 %   MCV 96.4 80.0 - 100.0 fL   MCH 32.8 26.0 - 34.0 pg   MCHC 34.0 30.0 - 36.0 g/dL   RDW 42.7 06.2 - 37.6 %   Platelets 185 150 - 400 K/uL   nRBC 0.0 0.0 - 0.2 %    Comment: Performed at [facility], [address] [place],  [place] [address]  Comprehensive metabolic panel     Status: Abnormal   Collection Time: 02/27/23  7:13 AM  Result Value Ref Range    Sodium 140 135 - 145 mmol/L   Potassium 3.8 3.5 - 5.1 mmol/L   Chloride 102 98 - 111 mmol/L   CO2 22 22 - 32 mmol/L   Glucose, Bld 128 (H) 70 - 99 mg/dL    Comment: Glucose reference range applies only to samples taken after fasting for at least 8 hours.   BUN 8 8 - 23 mg/dL   Creatinine, Ser 8.65 (L) 0.61 - 1.24 mg/dL   Calcium 9.0 8.9 - 78.4 mg/dL   Total Protein 7.5 6.5 - 8.1 g/dL   Albumin 3.3 (L) 3.5 - 5.0 g/dL   AST 80 (H) 15 - 41 U/L   ALT 40 0 - 44 U/L   Alkaline Phosphatase 160 (H) 38 - 126 U/L   Total Bilirubin 7.7 (H) 0.3 - 1.2 mg/dL   GFR, Estimated >69 >62 mL/min    Comment: (NOTE) Calculated using the CKD-EPI Creatinine Equation (2021)    Anion gap 16 (H) 5 - 15    Comment: Performed at [facility], [address] [place], [place] [address]  Magnesium     Status: None   Collection Time: 02/27/23  7:13 AM  Result Value Ref Range   Magnesium 1.9 1.7 - 2.4 mg/dL    Comment: Performed at [facility], [address] [place], [place] [address]  Phosphorus     Status: None   Collection Time: 02/27/23  7:13 AM  Result Value Ref Range   Phosphorus 3.2 2.5 - 4.6 mg/dL    Comment: Performed at [facility], [address], [place], [place] [address]  Culture, blood (Routine X 2) w Reflex to ID Panel     Status: None (Preliminary result)   Collection Time: 02/27/23  7:13 AM   Specimen: BLOOD  Result Value Ref Range   Specimen Description      BLOOD BLOOD LEFT ARM Performed at [facility], [address], [place], [place] [address]    Special Requests      BOTTLES DRAWN AEROBIC AND ANAEROBIC Blood Culture adequate volume Performed at [facility], [address] [place], [place] [address]    Culture      NO GROWTH < 24 HOURS Performed at [facility], [address], [place], [place] [address]    Report Status PENDING   Reticulocytes     Status: Abnormal    Collection Time: 02/27/23  7:13 AM  Result Value Ref Range   Retic Ct Pct 1.6 0.4 - 3.1 %   RBC. 3.38 (L) 4.22 - 5.81 MIL/uL   Retic Count, Absolute 52.7 19.0 - 186.0 K/uL   Immature Retic Fract 13.5 2.3 - 15.9 %    Comment: Performed at [facility], [address] [place], [place] [address]  Protime-INR     Status: Abnormal   Collection Time: 02/27/23  9:48 AM  Result Value Ref Range   Prothrombin Time 15.9 (H) 11.4 - 15.2 seconds   INR 1.3 (H) 0.8 - 1.2    Comment: (NOTE) INR goal varies based on device and disease states. Performed at [facility], [address], [place], [place] [address]   Vitamin B12     Status: Abnormal   Collection Time: 02/27/23  1:49 PM  Result Value Ref  Range   Vitamin B-12 1,298 (H) 180 - 914 pg/mL    Comment: (NOTE) This assay is not validated for testing neonatal or myeloproliferative syndrome specimens for Vitamin B12 levels. Performed at [facility], [address], [place], [place] [address]   Iron and TIBC     Status: Abnormal   Collection Time: 02/27/23  1:49 PM  Result Value Ref Range   Iron 36 (L) 45 - 182 ug/dL   TIBC 604 540 - 981 ug/dL   Saturation Ratios 12 (L) 17.9 - 39.5 %   UIBC 259 ug/dL    Comment: Performed at [facility], [address], [place], [place] [address]  Ferritin     Status: None   Collection Time: 02/27/23  1:49 PM  Result Value Ref Range   Ferritin 106 24 - 336 ng/mL    Comment: ICTERUS AT THIS LEVEL MAY AFFECT RESULT Performed at [facility], [address] [place], [place] [address]   Folate     Status: None   Collection Time: 02/28/23  4:56 AM  Result Value Ref Range   Folate 16.2 >5.9 ng/mL    Comment: Performed at [facility], [address] [place] [place], [place] [address]  Comprehensive metabolic panel     Status: Abnormal   Collection Time: 02/28/23  4:56 AM  Result Value Ref Range    Sodium 140 135 - 145 mmol/L   Potassium 3.4 (L) 3.5 - 5.1 mmol/L   Chloride 103 98 - 111 mmol/L   CO2 23 22 - 32 mmol/L   Glucose, Bld 82 70 - 99 mg/dL    Comment: Glucose reference range applies only to samples taken after fasting for at least 8 hours.   BUN 17 8 - 23 mg/dL   Creatinine, Ser 6.57 0.61 - 1.24 mg/dL   Calcium 8.6 (L) 8.9 - 10.3 mg/dL   Total Protein 7.2 6.5 - 8.1 g/dL   Albumin 3.3 (L) 3.5 - 5.0 g/dL   AST 77 (H) 15 - 41 U/L   ALT 39 0 - 44 U/L   Alkaline Phosphatase 155 (H) 38 - 126 U/L   Total Bilirubin 8.9 (H) 0.3 - 1.2 mg/dL   GFR, Estimated >84 >69 mL/min    Comment: (NOTE) Calculated using the CKD-EPI Creatinine Equation (2021)    Anion gap 14 5 - 15    Comment: Performed at [facility], [address], [place], [place] [address]  CBC     Status: Abnormal   Collection Time: 02/28/23  4:56 AM  Result Value Ref Range   WBC 10.2 4.0 - 10.5 K/uL   RBC 3.48 (L) 4.22 - 5.81 MIL/uL   Hemoglobin 11.5 (L) 13.0 - 17.0 g/dL   HCT 84.1 (L) 32.4 - 40.1 %   MCV 97.7 80.0 - 100.0 fL   MCH 33.0 26.0 - 34.0 pg   MCHC 33.8 30.0 - 36.0 g/dL   RDW 02.7 25.3 - 66.4 %   Platelets 234 150 - 400 K/uL   nRBC 0.0 0.0 - 0.2 %    Comment: Performed at [facility], [address], [name], [place] [address]  Bilirubin, direct     Status: Abnormal   Collection Time: 02/28/23  4:56 AM  Result Value Ref Range   Bilirubin, Direct 5.5 (H) 0.0 - 0.2 mg/dL    Comment: Performed at [facility], [address] [place], [place] [address]  Lipase, blood  Status: Abnormal   Collection Time: 02/28/23 10:12 AM  Result Value Ref Range   Lipase 316 (H) 11 - 51 U/L    Comment: Performed at [facility], [address], [place], [place] [address]    MR ABDOMEN MRCP W WO CONTAST  Result Date: 02/27/2023 CLINICAL DATA:  Biliary dilatation with suspected choledocholithiasis EXAM: MRI ABDOMEN WITHOUT AND WITH  CONTRAST (INCLUDING MRCP) TECHNIQUE: Multiplanar multisequence MR imaging of the abdomen was performed both before and after the administration of intravenous contrast. Heavily T2-weighted images of the biliary and pancreatic ducts were obtained, and three-dimensional MRCP images were rendered by post processing. Post-processing was applied at the acquisition scanner with concurrent physician supervision which includes 3D reconstructions, MIPs, volume rendered images and/or shaded surface rendering. CONTRAST:  5mL GADAVIST GADOBUTROL 1 MMOL/ML IV SOLN COMPARISON:  CT scan 02/26/2023 FINDINGS: Lower chest: Linear atelectasis or scarring posteriorly in the left lower lobe. Uphill varices along the esophageal margin for example on image 29 series 19. Hepatobiliary: Gallbladder hydrops, 7.2 cm in diameter, with irregularity and some thickening of the gallbladder wall suspicious for acute cholecystitis. Small depending gallstones in the gallbladder as on image 24 series 3. Intrahepatic and extrahepatic biliary dilatation, common hepatic duct 1.4 cm in diameter, common bile duct 1.5 cm in diameter, with dependent small filling defect/debris in the common bile duct favoring choledocholithiasis. The obstructive ampullary stone shown on CT is less obvious on MRI but is faintly appreciable for example on image 34 of series 3, thought to measure 2-3 mm in diameter. There is a small incidental focally dilated segment of the biliary tree in the left hepatic lobe with a saccular morphology on image 41 series 17. Subtle irregularity of the hepatic contour reason the possibility of cirrhosis. No worrisome hepatic mass. Pancreas: Mild dorsal pancreatic duct can dilatation extending to the ampulla. Peripancreatic edema, correlate with lipase levels in assessing for acute pancreatitis. The peripancreatic stranding and early acute peripancreatic fluid collections are increased from 02/26/2023 CT scan. Spleen:  Unremarkable  Adrenals/Urinary Tract: 2.5 cm simple cyst of the left kidney lower pole. No further imaging workup of this lesion is indicated. Adrenal glands unremarkable.  Otherwise normal. Stomach/Bowel: Unremarkable Vascular/Lymphatic: As noted above there are uphill varices along the distal esophagus favoring portal venous hypertension. Dilated inferior mesenteric vein likely with collateralization to the lower IVC. Atherosclerosis is present, including aortoiliac atherosclerotic disease. Other:  Some mild perihepatic ascites. Musculoskeletal: Fusion at L4-5. IMPRESSION: 1. Substantial intrahepatic and extrahepatic biliary dilatation, with dependent small filling defect/debris in the common bile duct favoring choledocholithiasis. The obstructive ampullary stone shown on CT is less obvious on MRI but is faintly appreciable, thought to measure 2-3 mm in diameter. 2. Gallbladder hydrops, with irregularity and some thickening of the gallbladder wall suspicious for acute cholecystitis. Small dependent gallstones in the gallbladder. 3. Peripancreatic edema and mild peripancreatic acute fluid collections, correlate with lipase levels in assessing for acute pancreatitis. These fluid collections are new compared to yesterday's CT scan. 4. Subtle irregularity of the hepatic contour reason the possibility of cirrhosis. 5. Upstream varices along the distal esophagus favoring portal venous hypertension. Dilated inferior mesenteric vein likely with collateralization to the lower IVC. 6. Mild perihepatic ascites. 7. Aortic Atherosclerosis (ICD10-I70.0). Electronically Signed   By: Gaylyn Rong M.D.   On: 02/27/2023 17:00   MR 3D Recon At Scanner  Result Date: 02/27/2023 CLINICAL DATA:  Biliary dilatation with suspected choledocholithiasis EXAM: MRI ABDOMEN WITHOUT AND WITH CONTRAST (INCLUDING MRCP) TECHNIQUE:  Multiplanar multisequence MR imaging of the abdomen was performed both before and after the administration of intravenous  contrast. Heavily T2-weighted images of the biliary and pancreatic ducts were obtained, and three-dimensional MRCP images were rendered by post processing. Post-processing was applied at the acquisition scanner with concurrent physician supervision which includes 3D reconstructions, MIPs, volume rendered images and/or shaded surface rendering. CONTRAST:  5mL GADAVIST GADOBUTROL 1 MMOL/ML IV SOLN COMPARISON:  CT scan 02/26/2023 FINDINGS: Lower chest: Linear atelectasis or scarring posteriorly in the left lower lobe. Uphill varices along the esophageal margin for example on image 29 series 19. Hepatobiliary: Gallbladder hydrops, 7.2 cm in diameter, with irregularity and some thickening of the gallbladder wall suspicious for acute cholecystitis. Small depending gallstones in the gallbladder as on image 24 series 3. Intrahepatic and extrahepatic biliary dilatation, common hepatic duct 1.4 cm in diameter, common bile duct 1.5 cm in diameter, with dependent small filling defect/debris in the common bile duct favoring choledocholithiasis. The obstructive ampullary stone shown on CT is less obvious on MRI but is faintly appreciable for example on image 34 of series 3, thought to measure 2-3 mm in diameter. There is a small incidental focally dilated segment of the biliary tree in the left hepatic lobe with a saccular morphology on image 41 series 17. Subtle irregularity of the hepatic contour reason the possibility of cirrhosis. No worrisome hepatic mass. Pancreas: Mild dorsal pancreatic duct can dilatation extending to the ampulla. Peripancreatic edema, correlate with lipase levels in assessing for acute pancreatitis. The peripancreatic stranding and early acute peripancreatic fluid collections are increased from 02/26/2023 CT scan. Spleen:  Unremarkable Adrenals/Urinary Tract: 2.5 cm simple cyst of the left kidney lower pole. No further imaging workup of this lesion is indicated. Adrenal glands unremarkable.  Otherwise  normal. Stomach/Bowel: Unremarkable Vascular/Lymphatic: As noted above there are uphill varices along the distal esophagus favoring portal venous hypertension. Dilated inferior mesenteric vein likely with collateralization to the lower IVC. Atherosclerosis is present, including aortoiliac atherosclerotic disease. Other:  Some mild perihepatic ascites. Musculoskeletal: Fusion at L4-5. IMPRESSION: 1. Substantial intrahepatic and extrahepatic biliary dilatation, with dependent small filling defect/debris in the common bile duct favoring choledocholithiasis. The obstructive ampullary stone shown on CT is less obvious on MRI but is faintly appreciable, thought to measure 2-3 mm in diameter. 2. Gallbladder hydrops, with irregularity and some thickening of the gallbladder wall suspicious for acute cholecystitis. Small dependent gallstones in the gallbladder. 3. Peripancreatic edema and mild peripancreatic acute fluid collections, correlate with lipase levels in assessing for acute pancreatitis. These fluid collections are new compared to yesterday's CT scan. 4. Subtle irregularity of the hepatic contour reason the possibility of cirrhosis. 5. Upstream varices along the distal esophagus favoring portal venous hypertension. Dilated inferior mesenteric vein likely with collateralization to the lower IVC. 6. Mild perihepatic ascites. 7. Aortic Atherosclerosis (ICD10-I70.0). Electronically Signed   By: Gaylyn Rong M.D.   On: 02/27/2023 17:00   CT ABDOMEN PELVIS W CONTRAST  Result Date: 02/26/2023 CLINICAL DATA:  Abdominal pain. EXAM: CT ABDOMEN AND PELVIS WITH CONTRAST TECHNIQUE: Multidetector CT imaging of the abdomen and pelvis was performed using the standard protocol following bolus administration of intravenous contrast. RADIATION DOSE REDUCTION: This exam was performed according to the departmental dose-optimization program which includes automated exposure control, adjustment of the mA and/or kV according to  patient size and/or use of iterative reconstruction technique. CONTRAST:  OMNIPAQUE IOHEXOL 300 MG/ML  SOLN COMPARISON:  CT abdomen pelvis dated 06/17/2017. FINDINGS: Lower chest: There are  bibasilar atelectasis. No intra-abdominal free air. Small perihepatic free fluid and small free fluid in the pelvis. Hepatobiliary: There is irregularity of the liver contour suggestive of changes of cirrhosis. There are multiple stones within the gallbladder. The gallbladder is distended. There is a small pericholecystic fluid. Faint curvilinear density within the gallbladder lumen may represent sludge. Gallbladder wall perforation is less likely but not excluded. There is dilatation of the intrahepatic biliary tree and common bile duct. The common bile duct measures approximately 17 mm in diameter. There is a 4 mm stone in the central CBD (37/2). Pancreas: Unremarkable. No pancreatic ductal dilatation or surrounding inflammatory changes. Spleen: Normal in size without focal abnormality. Adrenals/Urinary Tract: The adrenal glands unremarkable. Small left renal inferior pole cyst. There is no hydronephrosis on either side. There is symmetric enhancement and excretion of contrast by both kidneys. The visualized ureters and the urinary bladder appear unremarkable. Stomach/Bowel: There is no bowel obstruction or active inflammation. The appendix is normal. Vascular/Lymphatic: Advanced aortoiliac atherosclerotic disease. The IVC is unremarkable. No portal venous gas. There is no adenopathy. Para soft tissue varices and dilated portosystemic collateral vein. Reproductive: The prostate and seminal vesicles are grossly unremarkable. No pelvic mass. Other: None Musculoskeletal: Osteopenia with degenerative changes of the spine and hips. Mildly displaced fracture of the greater trochanter of the right femur. IMPRESSION: 1. Cholelithiasis and nonobstructing stone in the central CBD with dilatation of the biliary tree and findings  concerning for acute cholecystitis. Further evaluation with MRI/MRCP is recommended. 2. Cirrhosis with sequela of portal hypertension. 3. No bowel obstruction. Normal appendix. 4. Mildly displaced fracture of the greater trochanter of the right femur. 5.  Aortic Atherosclerosis (ICD10-I70.0). Electronically Signed   By: Elgie Collard M.D.   On: 02/26/2023 23:48    Imaging: Personally reviewed  Data reviewed: Reviewed progress notes since admission, h&P note; GI consult note, echocardiogram from 2020, abdominal CT, MRI abdomen, labs extending back to March 2024  A/P: Worthy Boschert. is an 67 y.o. 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  He is tender on exam.  He does have pancreatitis.  No elevated white count.  Agree with ERCP to clear his common bile duct for choledocholithiasis.  Patient had normal LFTs a few months ago.  So it is very probable his current LFTs are strictly due to biliary obstruction.    Difficult to calculate child's Pugh score/MELD score given his biliary obstruction.  Would recommend reassessing Georgiann Cocker score and MELD score once his bili obstruction has resolved to help gauge his operative risk.  Nonetheless even though he has normal platelets he does have a mildly elevated INR and what is more concerning is the varices and collateral connection between his IMV to the IVC seen on radiological imaging which definitely increases his risk of intraoperative bleeding for cholecystectomy  Will need to see how pt does with ERCP and pancreatitis. Will also discuss with our [facility] surgeons.   Mary Sella. Andrey Campanile, MD, FACS General, Bariatric, &  Minimally Invasive Surgery [facility]

## 2023-02-28 NOTE — H&P (View-Only) (Signed)
Gastroenterology Progress Note/covering for Dr. Loreta Ave and Ames Dura. 67 y.o. Feb 02, 1956  CC: Choledocholithiasis, cholecystitis   Subjective: Patient seen and examined at bedside.  He continues to have right upper quadrant abdominal pain.  Some nausea.  Denies any vomiting.  Denies diarrhea or constipation.  Wants to go home.  ROS : Afebrile, negative for chest pain   Objective: Vital signs in last 24 hours: Vitals:   02/28/23 0941 02/28/23 0952  BP: (!) 160/80 (!) 160/80  Pulse: (!) 105   Resp: 16   Temp: 98.9 F (37.2 C)   SpO2: 96%     Physical Exam:  General:  Alert, cooperative, no distress, appears stated age  Head:  Normocephalic, without obvious abnormality, atraumatic  Eyes:  Scleral icterus noted  Lungs:   No visible respiratory distress  Heart:  Regular rate and rhythm, S1, S2 normal  Abdomen:   Right upper quadrant tenderness to palpation, abdomen is otherwise soft, bowel sound present, no peritoneal signs  Extremities: Extremities normal, atraumatic, no  edema  Pulses: 2+ and symmetric    Lab Results: Recent Labs    02/27/23 0713 02/28/23 0456  NA 140 140  K 3.8 3.4*  CL 102 103  CO2 22 23  GLUCOSE 128* 82  BUN 8 17  CREATININE 0.58* 1.06  CALCIUM 9.0 8.6*  MG 1.9  --   PHOS 3.2  --    Recent Labs    02/27/23 0713 02/28/23 0456  AST 80* 77*  ALT 40 39  ALKPHOS 160* 155*  BILITOT 7.7* 8.9*  PROT 7.5 7.2  ALBUMIN 3.3* 3.3*   Recent Labs    02/27/23 0713 02/28/23 0456  WBC 6.3 10.2  HGB 11.1* 11.5*  HCT 32.6* 34.0*  MCV 96.4 97.7  PLT 185 234   Recent Labs    02/27/23 0948  LABPROT 15.9*  INR 1.3*      Assessment/Plan: -Right upper quadrant abdominal pain with MRI confirming choledocholithiasis. -Acute pancreatitis.  Biliary pancreatitis from above. -Cirrhosis with portal hypertension with possible esophageal varices based on imaging finding.  Patient with mild elevated INR.  Normal platelet count.  Difficult  to calculate MELD score with obstructive jaundice. -Possible acute cholecystitis. -Abnormal LFTs with jaundice.  Likely from choledocholithiasis.  Recommendations ------------------------ -Case discussed with Dr. Elnoria Howard.  Tentative plan for ERCP today.  Continue antibiotics for now.  He has not received his prophylactic Lovenox today.  Discussed with RN to hold off on prophylaxis Lovenox for now. -Patient will likely need cholecystectomy.  Discussed with hospitalist to arrange for surgery consult. -Recommend outpatient workup of cirrhosis once acute issues are resolved. -GI will follow.  Risks (post ERCP pancreatitis, bleeding, infection, bowel perforation that could require surgery, sedation-related changes in cardiopulmonary systems), benefits (identification and possible treatment of source of symptoms, exclusion of certain causes of symptoms), and alternatives (watchful waiting, radiographic imaging studies, empiric medical treatment)  were explained to patient/family in detail and patient wishes to proceed.    Kathi Der MD, FACP 02/28/2023, 11:41 AM  Contact #  [phone]

## 2023-02-28 NOTE — Op Note (Signed)
[facility] Patient Name: Mark [name] Procedure Date: 02/28/2023 MRN: [id number] Attending MD: Jeani Hawking , MD, [id number] Date of Birth: [birth year] CSN: [id number] Age: 67 Admit Type: Inpatient Procedure:                ERCP Indications:              Common bile duct stone(s) Providers:                Jeani Hawking, MD, Eliberto Ivory, RN, Brion Aliment,                            Technician Referring MD:              Medicines:                General Anesthesia Complications:            No immediate complications. Estimated Blood Loss:     Estimated blood loss: none. Procedure:                Pre-Anesthesia Assessment:                           - Prior to the procedure, a History and Physical                            was performed, and patient medications and                            allergies were reviewed. The patient's tolerance of                            previous anesthesia was also reviewed. The risks                            and benefits of the procedure and the sedation                            options and risks were discussed with the patient.                            All questions were answered, and informed consent                            was obtained. Prior Anticoagulants: The patient has                            taken no anticoagulant or antiplatelet agents. ASA                            Grade Assessment: III - A patient with severe                            systemic disease. After reviewing the risks and                            benefits,  the patient was deemed in satisfactory                            condition to undergo the procedure.                           - Sedation was administered by an anesthesia                            professional. General anesthesia was attained.                           After obtaining informed consent, the scope was                            passed under direct vision. Throughout the                             procedure, the patient's blood pressure, pulse, and                            oxygen saturations were monitored continuously. The                            [id number]-[id number] ([id number] Olympus duodenoscope was                            introduced through the mouth, and used to inject                            contrast into and used to inject contrast into the                            bile, dorsal and ventral pancreatic ducts. The ERCP                            was technically difficult and complex. The patient                            tolerated the procedure well. Scope In: Scope Out: Findings:      The major papilla was normal. The minor papilla was normal. The bile       duct was deeply cannulated with the short-nosed traction sphincterotome.       Contrast was injected. I personally interpreted the bile duct images.       There was brisk flow of contrast through the ducts. Image quality was       excellent. Contrast extended to the main bile duct. The common bile duct       was moderately dilated and diffusely dilated, acquired. The largest       diameter was 10 mm. Revolution wire was passed into the biliary tree. A       15 mm biliary sphincterotomy was made with a traction (standard)       sphincterotome using ERBE electrocautery. There was no       post-sphincterotomy bleeding.  The biliary tree was swept with a 12 mm       balloon starting at the bifurcation. Clots were swept from the duct.       Debris was swept from the duct.      Initial inspection of the ampulla showed that it was spontaneously       draining, but it was not clear if there was some drainage of blood. A       two wire technique was used to gain access into the CBD. The patient was       noted to have a very long papilla. After several cannulation attempts in       different angles the guidewire was advanced in to the main PD. The wire       was secured and a Revolution wire was used. With a  greater bow of the       sphincterotome the guidewire selectively cannulated the CBD. It was       secured in the right intrahepatic ducts. Contrast injection showed that       the duct was dilated up to 1 cm, but it appeared irregular. With his       portal HTN a slow, careful, and episodic sphincterotomy was created. No       bleeding was induced. Even with the large 15 mm sphincterotomy there was       no a rapid rush of contrast. Some dark material presumed to be some       debris flowed out spontaneously. With the 12 mm balloon, the CBD was       swept and clots were removed with the first pass. Subsequent sweeps of       the CBD was negative for any further clot removal. The occulsion       cholangiogram was performed and there was no evidence of any retained       stones, debris, or clots. There was no evidence of any leak and the       gallbladder was filling appropriately. The CBD irregularity initially       identified with the cholangiography resolved after the sweeps.       Presumably the irregularities were secondary to the clots. Impression:               - The major papilla appeared normal.                           - The minor papilla appeared normal.                           - The common bile duct was moderately dilated,                            acquired.                           - A biliary sphincterotomy was performed.                           - The biliary tree was swept and clots and debris                            were found. Moderate  Sedation:      Not Applicable - Patient had care per Anesthesia. Recommendation:           - Observe patient in GI recovery unit for ongoing                            care.                           - Resume regular diet.                           - Lap chole per Surgery. Procedure Code(s):        --- Professional ---                           [phone], Endoscopic retrograde                            cholangiopancreatography  (ERCP); with removal of                            calculi/debris from biliary/pancreatic duct(s)                           43262, Endoscopic retrograde                            cholangiopancreatography (ERCP); with                            sphincterotomy/papillotomy                           [phone], Endoscopic catheterization of the biliary                            ductal system, radiological supervision and                            interpretation Diagnosis Code(s):        --- Professional ---                           K80.50, Calculus of bile duct without cholangitis                            or cholecystitis without obstruction                           K83.8, Other specified diseases of biliary tract CPT copyright 2022 [employer]. All rights reserved. The codes documented in this report are preliminary and upon coder review may  be revised to meet current compliance requirements. Jeani Hawking, MD Jeani Hawking, MD 02/28/2023 2:16:30 PM This report has been signed electronically. Number of Addenda: 0

## 2023-03-01 DIAGNOSIS — K8050 Calculus of bile duct without cholangitis or cholecystitis without obstruction: Secondary | ICD-10-CM | POA: Diagnosis not present

## 2023-03-01 LAB — COMPREHENSIVE METABOLIC PANEL
ALT: 31 U/L (ref 0–44)
AST: 54 U/L — ABNORMAL HIGH (ref 15–41)
Albumin: 2.6 g/dL — ABNORMAL LOW (ref 3.5–5.0)
Alkaline Phosphatase: 137 U/L — ABNORMAL HIGH (ref 38–126)
Anion gap: 9 (ref 5–15)
BUN: 47 mg/dL — ABNORMAL HIGH (ref 8–23)
CO2: 20 mmol/L — ABNORMAL LOW (ref 22–32)
Calcium: 7.1 mg/dL — ABNORMAL LOW (ref 8.9–10.3)
Chloride: 111 mmol/L (ref 98–111)
Creatinine, Ser: 1.71 mg/dL — ABNORMAL HIGH (ref 0.61–1.24)
GFR, Estimated: 43 mL/min — ABNORMAL LOW (ref >60)
Glucose, Bld: 93 mg/dL (ref 70–99)
Potassium: 3.7 mmol/L (ref 3.5–5.1)
Sodium: 140 mmol/L (ref 135–145)
Total Bilirubin: 4 mg/dL — ABNORMAL HIGH (ref 0.3–1.2)
Total Protein: 6.3 g/dL — ABNORMAL LOW (ref 6.5–8.1)

## 2023-03-01 LAB — MAGNESIUM: Magnesium: 2.5 mg/dL — ABNORMAL HIGH (ref 1.7–2.4)

## 2023-03-01 LAB — IRON AND TIBC
Iron: 24 ug/dL — ABNORMAL LOW (ref 45–182)
Saturation Ratios: 11 % — ABNORMAL LOW (ref 17.9–39.5)
TIBC: 228 ug/dL — ABNORMAL LOW (ref 250–450)
UIBC: 204 ug/dL

## 2023-03-01 LAB — FERRITIN: Ferritin: 79 ng/mL (ref 24–336)

## 2023-03-01 LAB — LIPASE, BLOOD: Lipase: 87 U/L — ABNORMAL HIGH (ref 11–51)

## 2023-03-01 LAB — HEPATITIS B SURFACE ANTIGEN: Hepatitis B Surface Ag: NONREACTIVE

## 2023-03-01 MED ORDER — LORAZEPAM 0.5 MG PO TABS
0.5000 mg | ORAL_TABLET | Freq: Once | ORAL | Status: AC
  Administered 2023-03-01: 0.5 mg via ORAL
  Filled 2023-03-01: qty 1

## 2023-03-01 MED ORDER — CARMEX CLASSIC LIP BALM EX OINT
TOPICAL_OINTMENT | CUTANEOUS | Status: DC | PRN
  Filled 2023-03-01: qty 10

## 2023-03-01 MED ORDER — HYDRALAZINE HCL 10 MG PO TABS
10.0000 mg | ORAL_TABLET | Freq: Four times a day (QID) | ORAL | Status: DC
  Administered 2023-03-01 – 2023-03-03 (×8): 10 mg via ORAL
  Filled 2023-03-01 (×10): qty 1

## 2023-03-01 MED ORDER — SODIUM CHLORIDE 0.9 % IV SOLN
INTRAVENOUS | Status: DC
Start: 2023-03-01 — End: 2023-03-02

## 2023-03-01 NOTE — Progress Notes (Signed)
Choledocholithiasis  Subjective: Underwent ERCP yesterday where debris and clots were removed from the CBD.  Cholangiogram revealed filling of GB from cystic duct.    Objective: Vital signs in last 24 hours: Temp:  [97.5 F (36.4 C)-99.1 F (37.3 C)] 98.3 F (36.8 C) (11/03 1610) Pulse Rate:  [83-111] 89 (11/03 0633) Resp:  [12-18] 18 (11/03 9604) BP: (129-160)/(59-80) 141/70 (11/03 0633) SpO2:  [96 %-100 %] 98 % (11/03 5409) Weight:  [54.4 kg] 54.4 kg (11/02 1257) Last BM Date : 02/26/23  Intake/Output from previous day: 11/02 0701 - 11/03 0700 In: 887.9 [P.O.:480; I.V.:150.2; IV Piggyback:257.7] Out: 800 [Urine:800] Intake/Output this shift: Total I/O In: 120 [P.O.:120] Out: -   General appearance: alert and cooperative GI: soft, nontender  Lab Results:  Results for orders placed or performed during the hospital encounter of 02/26/23 (from the past 24 hour(s))  Comprehensive metabolic panel     Status: Abnormal   Collection Time: 03/01/23  5:16 AM  Result Value Ref Range   Sodium 140 135 - 145 mmol/L   Potassium 3.7 3.5 - 5.1 mmol/L   Chloride 111 98 - 111 mmol/L   CO2 20 (L) 22 - 32 mmol/L   Glucose, Bld 93 70 - 99 mg/dL   BUN 47 (H) 8 - 23 mg/dL   Creatinine, Ser 8.11 (H) 0.61 - 1.24 mg/dL   Calcium 7.1 (L) 8.9 - 10.3 mg/dL   Total Protein 6.3 (L) 6.5 - 8.1 g/dL   Albumin 2.6 (L) 3.5 - 5.0 g/dL   AST 54 (H) 15 - 41 U/L   ALT 31 0 - 44 U/L   Alkaline Phosphatase 137 (H) 38 - 126 U/L   Total Bilirubin 4.0 (H) 0.3 - 1.2 mg/dL   GFR, Estimated 43 (L) >60 mL/min   Anion gap 9 5 - 15  Magnesium     Status: Abnormal   Collection Time: 03/01/23  5:16 AM  Result Value Ref Range   Magnesium 2.5 (H) 1.7 - 2.4 mg/dL  Lipase, blood     Status: Abnormal   Collection Time: 03/01/23  5:16 AM  Result Value Ref Range   Lipase 87 (H) 11 - 51 U/L     Studies/Results Radiology     MEDS, Scheduled  amLODipine  10 mg Oral Daily   folic acid  1 mg Oral Daily    hydrALAZINE  10 mg Oral Q6H   multivitamin with minerals  1 tablet Oral Daily   thiamine  100 mg Oral Daily   Or   thiamine  100 mg Intravenous Daily     Assessment: Choledocholithiasis Cirrhosis  Pancreatitis   Plan: Will need careful evaluation of cirrhosis before decision for surgery  Pancreatitis resolving No plans for surgery today   LOS: 2 days    Vanita Panda, MD [facility], [place]  Intermediate decision making   03/01/2023 9:30 AM

## 2023-03-01 NOTE — Progress Notes (Signed)
Gastroenterology Progress Note/covering for Dr. Loreta Ave and [facility] patient  Mark [name]. 67 y.o. 14-Jul-1955  CC: Choledocholithiasis, cholecystitis   Subjective: Patient seen and examined at bedside.  Underwent ERCP with sphincterotomy with removal of blood clots and debris's yesterday.  Feeling much better today.  Complaining of some loose stools.  Denies any blood in the stool or black stools.  ROS : Afebrile, negative for chest pain   Objective: Vital signs in last 24 hours: Vitals:   03/01/23 0118 03/01/23 0633  BP: 132/62 (!) 141/70  Pulse: (!) 106 89  Resp: 18 18  Temp: 98.3 F (36.8 C) 98.3 F (36.8 C)  SpO2: 96% 98%    Physical Exam:  General:  Alert, cooperative, no distress, appears stated age  Head:  Normocephalic, without obvious abnormality, atraumatic  Eyes:  Mild Scleral icterus noted  Lungs:   No visible respiratory distress  Heart:  Regular rate and rhythm, S1, S2 normal  Abdomen:   Mild right upper quadrant discomfort on palpation, improved compared to yesterday, bowel sound present, no peritoneal signs   Alert and oriented x 3   Mood and affect normal    Lab Results: Recent Labs    02/27/23 0713 02/28/23 0456 03/01/23 0516  NA 140 140 140  K 3.8 3.4* 3.7  CL 102 103 111  CO2 22 23 20*  GLUCOSE 128* 82 93  BUN 8 17 47*  CREATININE 0.58* 1.06 1.71*  CALCIUM 9.0 8.6* 7.1*  MG 1.9  --  2.5*  PHOS 3.2  --   --    Recent Labs    02/28/23 0456 03/01/23 0516  AST 77* 54*  ALT 39 31  ALKPHOS 155* 137*  BILITOT 8.9* 4.0*  PROT 7.2 6.3*  ALBUMIN 3.3* 2.6*   Recent Labs    02/27/23 0713 02/28/23 0456  WBC 6.3 10.2  HGB 11.1* 11.5*  HCT 32.6* 34.0*  MCV 96.4 97.7  PLT 185 234   Recent Labs    02/27/23 0948  LABPROT 15.9*  INR 1.3*      Assessment/Plan: -Right upper quadrant abdominal pain with MRI confirming choledocholithiasis. -Acute pancreatitis.  Biliary pancreatitis from above. -Cirrhosis with portal  hypertension with possible esophageal varices based on imaging finding.  Patient with mild elevated INR.  Normal platelet count.  Difficult to calculate MELD score with obstructive jaundice. -Possible acute cholecystitis. -Abnormal LFTs with jaundice.  Likely from choledocholithiasis. -Alcohol use.  Recommendations ------------------------ -Status post ERCP with sphincterotomy with removal of blood clots and debris's yesterday.  Feeling much better today.  LFTs improving. -Appreciate surgery  input.  Looks like there is no plan for cholecystectomy at this time pending further workup for his cirrhosis.  Cirrhosis most likely from alcohol use.  I will order some basic workup(hepatitis B surface antigen, HCV antibody, iron panel, ferritin,AMA and ASMA)  while patient remains hospitalized. -Absolute alcohol abstinence discussed with the patient.  -[facility] will follow-up tomorrow.  Please call [facility] on-call doctor for any abdominal needs.   Kathi Der MD, FACP 03/01/2023, 10:57 AM  Contact #  [phone]

## 2023-03-01 NOTE — Progress Notes (Signed)
PROGRESS NOTE    Mark [name].  ZOX:[id number] DOB: [birth year] DOA: 02/26/2023 PCP: Deeann Saint, MD     Brief Narrative:  Mark Clayson. is a 67 y.o. male with medical history significant for alcohol abuse, alcoholic cirrhosis, history of CVA, coronary artery disease, anemia of chronic disease, who presents to the ED from home due to 2 days of right upper quadrant pain associated with nausea and vomiting.  Last alcohol consumption was on the day of presentation.  No reported subjective fevers or chills.   In the ED, presented with significant pain requiring several rounds of IV opioid-based analgesics.  Lab studies notable for elevated liver chemistries with T. bili of 6.8.  Contrast-enhanced CT abdomen pelvis revealed the following: Cholelithiasis and nonobstructing stone in the central common bile duct with dilatation of the biliary tree and findings concerning for acute cholecystitis.  Further evaluation with MRCP is recommended.  Cirrhosis with sequela of portal hypertension.   Patient was admitted to the hospital, GI consulted.  MRCP showed intrahepatic and extrahepatic biliary dilatation, favoring choledocholithiasis, gallbladder hydrops, thickening of gallbladder wall suspicious for acute cholecystitis, peripancreatic edema and mild. Pancreatic acute fluid collection.  Patient underwent ERCP 11/2.  New events last 24 hours / Subjective: Feeling much better, denies abdominal pain today.   Assessment & Plan:   Principal Problem:   Choledocholithiasis Active Problems:   Cirrhosis, alcoholic ([name])   S/P right TKA   Cholelithiasis with biliary obstruction   HTN (hypertension)   Hypokalemia   Choledocholithiasis and biliary obstruction, acute cholecystitis, acute gallstone pancreatitis  -MRCP showed intrahepatic and extrahepatic biliary dilatation, favoring choledocholithiasis, gallbladder hydrops, thickening of gallbladder wall suspicious for acute cholecystitis,  peripancreatic edema and mild.  Pancreatic acute fluid collection -GI following.  Status post ERCP 11/2 -General Surgery following -Zosyn  AKI -Baseline creatinine 0.6 -Discontinue HCTZ -Start IV fluid today, encourage p.o.   Alcoholic cirrhosis -GI following   Alcohol abuse with concern for withdrawal -Last drink per report 10/29 -CIWA protocol   Hypertension -Norvasc, hydralazine    DVT prophylaxis:  Place and maintain sequential compression device Start: 02/27/23 1916  Code Status: Full code.  Patient states that he does not agree with DNR.  Discussed with him that he had been DNR previous hospitalization.  He states that he "loves his life" and would want full resuscitation. Family Communication: None at bedside; updated wife over the phone 11/2 Disposition Plan: Home Status is: Inpatient Remains inpatient appropriate because: Pending general surgery plan  Antimicrobials:  Anti-infectives (From admission, onward)    Start     Dose/Rate Route Frequency Ordered Stop   02/27/23 0800  piperacillin-tazobactam (ZOSYN) IVPB 3.375 g        3.375 g 12.5 mL/hr over 240 Minutes Intravenous Every 8 hours 02/27/23 0511     02/27/23 0030  piperacillin-tazobactam (ZOSYN) IVPB 3.375 g        3.375 g 100 mL/hr over 30 Minutes Intravenous  Once 02/27/23 0024 02/27/23 0140        Objective: Vitals:   02/28/23 1746 02/28/23 2023 03/01/23 0118 03/01/23 0633  BP: (!) 142/69 139/69 132/62 (!) 141/70  Pulse: (!) 103 (!) 102 (!) 106 89  Resp: 16 18 18 18   Temp: 99.1 F (37.3 C) 98.9 F (37.2 C) 98.3 F (36.8 C) 98.3 F (36.8 C)  TempSrc: Oral Oral Oral Oral  SpO2: 97% 99% 96% 98%  Weight:      Height:  Intake/Output Summary (Last 24 hours) at 03/01/2023 1154 Last data filed at 03/01/2023 0752 Gross per 24 hour  Intake 600 ml  Output 400 ml  Net 200 ml   Filed Weights   02/26/23 2025 02/28/23 1257  Weight: 54.4 kg 54.4 kg    Examination:  General exam:  Appears calm  Respiratory system: Clear to auscultation. Respiratory effort normal. No respiratory distress. No conversational dyspnea.  Cardiovascular system: S1 & S2 heard, RRR. No murmurs. No pedal edema. Gastrointestinal system: Abdomen is nondistended, soft, nontender Central nervous system: Alert and oriented. No focal neurological deficits. Speech clear.  Skin: No rashes, lesions or ulcers on exposed skin  Psychiatry: Judgement and insight appear normal. Mood & affect appropriate.   Data Reviewed: I have personally reviewed following labs and imaging studies  CBC: Recent Labs  Lab 02/26/23 2054 02/27/23 0713 02/28/23 0456  WBC 5.3 6.3 10.2  HGB 10.8* 11.1* 11.5*  HCT 31.7* 32.6* 34.0*  MCV 97.8 96.4 97.7  PLT 192 185 234   Basic Metabolic Panel: Recent Labs  Lab 02/26/23 2054 02/27/23 0713 02/28/23 0456 03/01/23 0516  NA 138 140 140 140  K 3.9 3.8 3.4* 3.7  CL 106 102 103 111  CO2 24 22 23  20*  GLUCOSE 121* 128* 82 93  BUN 8 8 17  47*  CREATININE 0.67 0.58* 1.06 1.71*  CALCIUM 8.8* 9.0 8.6* 7.1*  MG  --  1.9  --  2.5*  PHOS  --  3.2  --   --    GFR: Estimated Creatinine Clearance: 31.4 mL/min (A) (by C-G formula based on SCr of 1.71 mg/dL (H)). Liver Function Tests: Recent Labs  Lab 02/26/23 2054 02/27/23 0713 02/28/23 0456 03/01/23 0516  AST 90* 80* 77* 54*  ALT 40 40 39 31  ALKPHOS 158* 160* 155* 137*  BILITOT 6.8* 7.7* 8.9* 4.0*  PROT 7.5 7.5 7.2 6.3*  ALBUMIN 3.4* 3.3* 3.3* 2.6*   Recent Labs  Lab 02/26/23 2054 02/28/23 1012 03/01/23 0516  LIPASE 25 316* 87*   No results for input(s): "AMMONIA" in the last 168 hours. Coagulation Profile: Recent Labs  Lab 02/27/23 0948  INR 1.3*   Cardiac Enzymes: No results for input(s): "CKTOTAL", "CKMB", "CKMBINDEX", "TROPONINI" in the last 168 hours. BNP (last 3 results) Recent Labs    03/10/22 1342  PROBNP 542.0*   HbA1C: No results for input(s): "HGBA1C" in the last 72 hours. CBG: No  results for input(s): "GLUCAP" in the last 168 hours. Lipid Profile: No results for input(s): "CHOL", "HDL", "LDLCALC", "TRIG", "CHOLHDL", "LDLDIRECT" in the last 72 hours. Thyroid Function Tests: No results for input(s): "TSH", "T4TOTAL", "FREET4", "T3FREE", "THYROIDAB" in the last 72 hours. Anemia Panel: Recent Labs    02/27/23 0713 02/27/23 1349 02/28/23 0456  VITAMINB12  --  1,298*  --   FOLATE  --   --  16.2  FERRITIN  --  106  --   TIBC  --  295  --   IRON  --  36*  --   RETICCTPCT 1.6  --   --    Sepsis Labs: No results for input(s): "PROCALCITON", "LATICACIDVEN" in the last 168 hours.  Recent Results (from the past 240 hour(s))  Culture, blood (Routine X 2) w Reflex to ID Panel     Status: None (Preliminary result)   Collection Time: 02/27/23  7:11 AM   Specimen: BLOOD  Result Value Ref Range Status   Specimen Description   Final    BLOOD BLOOD  RIGHT HAND Performed at [facility], [address] [place], [place] [address]    Special Requests   Final    BOTTLES DRAWN AEROBIC AND ANAEROBIC Blood Culture adequate volume Performed at [facility], [address], [place], [place] [address]    Culture   Final    NO GROWTH 2 DAYS Performed at [facility], [address] [place], [place] [address]    Report Status PENDING  Incomplete  Culture, blood (Routine X 2) w Reflex to ID Panel     Status: None (Preliminary result)   Collection Time: 02/27/23  7:13 AM   Specimen: BLOOD  Result Value Ref Range Status   Specimen Description   Final    BLOOD BLOOD LEFT ARM Performed at [facility], [address] [place], [place] [address]    Special Requests   Final    BOTTLES DRAWN AEROBIC AND ANAEROBIC Blood Culture adequate volume Performed at [facility], [address] [place], [place] [address]    Culture   Final    NO GROWTH 2 DAYS Performed at [facility], [address], [place], [place] [address]    Report Status PENDING  Incomplete      Radiology Studies: DG ERCP  Result Date: 02/28/2023 CLINICAL DATA:  Concern for acute cholecystitis and choledocholithiasis. EXAM: ERCP TECHNIQUE: Multiple spot images obtained with the fluoroscopic device and submitted for interpretation post-procedure. FLUOROSCOPY TIME: FLUOROSCOPY TIME 3 minutes, 47 seconds (41.8 mGy) COMPARISON:  MRCP-02/27/2023; CT abdomen pelvis-02/26/2023 FINDINGS: Seventeen spot fluoroscopic images of the right upper abdominal quadrant during ERCP are provided for review Initial image demonstrates an ERCP probe overlying the right upper abdominal quadrant. Subsequent images demonstrate selective cannulation of initially the pancreatic duct and ultimately the common bile duct. Subsequent images demonstrate opacification of the common bile duct which appears mildly dilated. Subsequent images demonstrate insufflation of a balloon within the central aspect of the CBD with presumed biliary sweeping and stone extraction. There is faint opacification of the cystic duct with opacification of the gallbladder lumen which appears irregular at the level of the gallbladder neck. IMPRESSION: ERCP with presumed biliary sweeping and sphincterotomy as detailed above. These images were submitted for radiologic interpretation only. Please see the procedural report for the amount of contrast and the fluoroscopy time utilized. Electronically Signed   By: Simonne Come M.D.   On: 02/28/2023 14:42   MR ABDOMEN MRCP W WO CONTAST  Result Date: 02/27/2023 CLINICAL DATA:  Biliary dilatation with suspected choledocholithiasis EXAM: MRI ABDOMEN WITHOUT AND WITH CONTRAST (INCLUDING MRCP) TECHNIQUE: Multiplanar multisequence MR imaging of the abdomen was performed both before and after the administration of intravenous contrast. Heavily T2-weighted images of the biliary and pancreatic ducts were obtained, and three-dimensional MRCP images were  rendered by post processing. Post-processing was applied at the acquisition scanner with concurrent physician supervision which includes 3D reconstructions, MIPs, volume rendered images and/or shaded surface rendering. CONTRAST:  5mL GADAVIST GADOBUTROL 1 MMOL/ML IV SOLN COMPARISON:  CT scan 02/26/2023 FINDINGS: Lower chest: Linear atelectasis or scarring posteriorly in the left lower lobe. Uphill varices along the esophageal margin for example on image 29 series 19. Hepatobiliary: Gallbladder hydrops, 7.2 cm in diameter, with irregularity and some thickening of the gallbladder wall suspicious for acute cholecystitis. Small depending gallstones in the gallbladder as on image 24 series 3. Intrahepatic and extrahepatic biliary dilatation, common hepatic duct 1.4 cm in diameter, common bile duct  1.5 cm in diameter, with dependent small filling defect/debris in the common bile duct favoring choledocholithiasis. The obstructive ampullary stone shown on CT is less obvious on MRI but is faintly appreciable for example on image 34 of series 3, thought to measure 2-3 mm in diameter. There is a small incidental focally dilated segment of the biliary tree in the left hepatic lobe with a saccular morphology on image 41 series 17. Subtle irregularity of the hepatic contour reason the possibility of cirrhosis. No worrisome hepatic mass. Pancreas: Mild dorsal pancreatic duct can dilatation extending to the ampulla. Peripancreatic edema, correlate with lipase levels in assessing for acute pancreatitis. The peripancreatic stranding and early acute peripancreatic fluid collections are increased from 02/26/2023 CT scan. Spleen:  Unremarkable Adrenals/Urinary Tract: 2.5 cm simple cyst of the left kidney lower pole. No further imaging workup of this lesion is indicated. Adrenal glands unremarkable.  Otherwise normal. Stomach/Bowel: Unremarkable Vascular/Lymphatic: As noted above there are uphill varices along the distal esophagus  favoring portal venous hypertension. Dilated inferior mesenteric vein likely with collateralization to the lower IVC. Atherosclerosis is present, including aortoiliac atherosclerotic disease. Other:  Some mild perihepatic ascites. Musculoskeletal: Fusion at L4-5. IMPRESSION: 1. Substantial intrahepatic and extrahepatic biliary dilatation, with dependent small filling defect/debris in the common bile duct favoring choledocholithiasis. The obstructive ampullary stone shown on CT is less obvious on MRI but is faintly appreciable, thought to measure 2-3 mm in diameter. 2. Gallbladder hydrops, with irregularity and some thickening of the gallbladder wall suspicious for acute cholecystitis. Small dependent gallstones in the gallbladder. 3. Peripancreatic edema and mild peripancreatic acute fluid collections, correlate with lipase levels in assessing for acute pancreatitis. These fluid collections are new compared to yesterday's CT scan. 4. Subtle irregularity of the hepatic contour reason the possibility of cirrhosis. 5. Upstream varices along the distal esophagus favoring portal venous hypertension. Dilated inferior mesenteric vein likely with collateralization to the lower IVC. 6. Mild perihepatic ascites. 7. Aortic Atherosclerosis (ICD10-I70.0). Electronically Signed   By: Gaylyn Rong M.D.   On: 02/27/2023 17:00   MR 3D Recon At Scanner  Result Date: 02/27/2023 CLINICAL DATA:  Biliary dilatation with suspected choledocholithiasis EXAM: MRI ABDOMEN WITHOUT AND WITH CONTRAST (INCLUDING MRCP) TECHNIQUE: Multiplanar multisequence MR imaging of the abdomen was performed both before and after the administration of intravenous contrast. Heavily T2-weighted images of the biliary and pancreatic ducts were obtained, and three-dimensional MRCP images were rendered by post processing. Post-processing was applied at the acquisition scanner with concurrent physician supervision which includes 3D reconstructions, MIPs,  volume rendered images and/or shaded surface rendering. CONTRAST:  5mL GADAVIST GADOBUTROL 1 MMOL/ML IV SOLN COMPARISON:  CT scan 02/26/2023 FINDINGS: Lower chest: Linear atelectasis or scarring posteriorly in the left lower lobe. Uphill varices along the esophageal margin for example on image 29 series 19. Hepatobiliary: Gallbladder hydrops, 7.2 cm in diameter, with irregularity and some thickening of the gallbladder wall suspicious for acute cholecystitis. Small depending gallstones in the gallbladder as on image 24 series 3. Intrahepatic and extrahepatic biliary dilatation, common hepatic duct 1.4 cm in diameter, common bile duct 1.5 cm in diameter, with dependent small filling defect/debris in the common bile duct favoring choledocholithiasis. The obstructive ampullary stone shown on CT is less obvious on MRI but is faintly appreciable for example on image 34 of series 3, thought to measure 2-3 mm in diameter. There is a small incidental focally dilated segment of the biliary tree in the left hepatic lobe with a saccular morphology on image 41  series 17. Subtle irregularity of the hepatic contour reason the possibility of cirrhosis. No worrisome hepatic mass. Pancreas: Mild dorsal pancreatic duct can dilatation extending to the ampulla. Peripancreatic edema, correlate with lipase levels in assessing for acute pancreatitis. The peripancreatic stranding and early acute peripancreatic fluid collections are increased from 02/26/2023 CT scan. Spleen:  Unremarkable Adrenals/Urinary Tract: 2.5 cm simple cyst of the left kidney lower pole. No further imaging workup of this lesion is indicated. Adrenal glands unremarkable.  Otherwise normal. Stomach/Bowel: Unremarkable Vascular/Lymphatic: As noted above there are uphill varices along the distal esophagus favoring portal venous hypertension. Dilated inferior mesenteric vein likely with collateralization to the lower IVC. Atherosclerosis is present, including aortoiliac  atherosclerotic disease. Other:  Some mild perihepatic ascites. Musculoskeletal: Fusion at L4-5. IMPRESSION: 1. Substantial intrahepatic and extrahepatic biliary dilatation, with dependent small filling defect/debris in the common bile duct favoring choledocholithiasis. The obstructive ampullary stone shown on CT is less obvious on MRI but is faintly appreciable, thought to measure 2-3 mm in diameter. 2. Gallbladder hydrops, with irregularity and some thickening of the gallbladder wall suspicious for acute cholecystitis. Small dependent gallstones in the gallbladder. 3. Peripancreatic edema and mild peripancreatic acute fluid collections, correlate with lipase levels in assessing for acute pancreatitis. These fluid collections are new compared to yesterday's CT scan. 4. Subtle irregularity of the hepatic contour reason the possibility of cirrhosis. 5. Upstream varices along the distal esophagus favoring portal venous hypertension. Dilated inferior mesenteric vein likely with collateralization to the lower IVC. 6. Mild perihepatic ascites. 7. Aortic Atherosclerosis (ICD10-I70.0). Electronically Signed   By: Gaylyn Rong M.D.   On: 02/27/2023 17:00      Scheduled Meds:  amLODipine  10 mg Oral Daily   folic acid  1 mg Oral Daily   hydrALAZINE  10 mg Oral Q6H   multivitamin with minerals  1 tablet Oral Daily   thiamine  100 mg Oral Daily   Or   thiamine  100 mg Intravenous Daily   Continuous Infusions:  sodium chloride 75 mL/hr at 03/01/23 1114   piperacillin-tazobactam 3.375 g (03/01/23 0752)     LOS: 2 days   Time spent: 25 minutes   [name], DO [facility] 03/01/2023, 11:54 AM   Available via Epic secure chat 7am-7pm After these hours, please refer to coverage provider listed on [email or web address]

## 2023-03-01 NOTE — Plan of Care (Signed)

## 2023-03-01 NOTE — Anesthesia Postprocedure Evaluation (Signed)
Anesthesia Post Note  Patient: Sebastain Fishbaugh.  Procedure(s) Performed: ENDOSCOPIC RETROGRADE CHOLANGIOPANCREATOGRAPHY (ERCP) SPHINCTEROTOMY REMOVAL OF DEBRIS     Patient location during evaluation: PACU Anesthesia Type: General Level of consciousness: awake and alert Pain management: pain level controlled Vital Signs Assessment: post-procedure vital signs reviewed and stable Respiratory status: spontaneous breathing, nonlabored ventilation, respiratory function stable and patient connected to nasal cannula oxygen Cardiovascular status: blood pressure returned to baseline and stable Postop Assessment: no apparent nausea or vomiting Anesthetic complications: no   No notable events documented.  Last Vitals:  Vitals:   03/01/23 0118 03/01/23 0633  BP: 132/62 (!) 141/70  Pulse: (!) 106 89  Resp: 18 18  Temp: 36.8 C 36.8 C  SpO2: 96% 98%    Last Pain:  Vitals:   03/01/23 0753  TempSrc:   PainSc: Asleep                 Loxley Schmale

## 2023-03-02 ENCOUNTER — Encounter: Payer: Medicare HMO | Admitting: Physical Therapy

## 2023-03-02 DIAGNOSIS — D62 Acute posthemorrhagic anemia: Secondary | ICD-10-CM | POA: Diagnosis not present

## 2023-03-02 DIAGNOSIS — K8050 Calculus of bile duct without cholangitis or cholecystitis without obstruction: Secondary | ICD-10-CM | POA: Diagnosis not present

## 2023-03-02 DIAGNOSIS — K7030 Alcoholic cirrhosis of liver without ascites: Secondary | ICD-10-CM | POA: Diagnosis not present

## 2023-03-02 LAB — COMPREHENSIVE METABOLIC PANEL
ALT: 34 U/L (ref 0–44)
AST: 60 U/L — ABNORMAL HIGH (ref 15–41)
Albumin: 2.4 g/dL — ABNORMAL LOW (ref 3.5–5.0)
Alkaline Phosphatase: 154 U/L — ABNORMAL HIGH (ref 38–126)
Anion gap: 10 (ref 5–15)
BUN: 50 mg/dL — ABNORMAL HIGH (ref 8–23)
CO2: 20 mmol/L — ABNORMAL LOW (ref 22–32)
Calcium: 7.7 mg/dL — ABNORMAL LOW (ref 8.9–10.3)
Chloride: 110 mmol/L (ref 98–111)
Creatinine, Ser: 1.88 mg/dL — ABNORMAL HIGH (ref 0.61–1.24)
GFR, Estimated: 38 mL/min — ABNORMAL LOW (ref >60)
Glucose, Bld: 87 mg/dL (ref 70–99)
Potassium: 3.5 mmol/L (ref 3.5–5.1)
Sodium: 140 mmol/L (ref 135–145)
Total Bilirubin: 2.5 mg/dL — ABNORMAL HIGH (ref <1.2)
Total Protein: 6 g/dL — ABNORMAL LOW (ref 6.5–8.1)

## 2023-03-02 LAB — CBC
HCT: 23.6 % — ABNORMAL LOW (ref 39.0–52.0)
Hemoglobin: 8.1 g/dL — ABNORMAL LOW (ref 13.0–17.0)
MCH: 33.3 pg (ref 26.0–34.0)
MCHC: 34.3 g/dL (ref 30.0–36.0)
MCV: 97.1 fL (ref 80.0–100.0)
Platelets: 153 10*3/uL (ref 150–400)
RBC: 2.43 MIL/uL — ABNORMAL LOW (ref 4.22–5.81)
RDW: 14.3 % (ref 11.5–15.5)
WBC: 6.5 10*3/uL (ref 4.0–10.5)
nRBC: 0 % (ref 0.0–0.2)

## 2023-03-02 LAB — HEMOGLOBIN AND HEMATOCRIT, BLOOD
HCT: 25.5 % — ABNORMAL LOW (ref 39.0–52.0)
Hemoglobin: 8.7 g/dL — ABNORMAL LOW (ref 13.0–17.0)

## 2023-03-02 LAB — OCCULT BLOOD X 1 CARD TO LAB, STOOL: Fecal Occult Bld: POSITIVE — AB

## 2023-03-02 MED ORDER — TRAZODONE HCL 50 MG PO TABS
50.0000 mg | ORAL_TABLET | Freq: Every evening | ORAL | Status: DC | PRN
  Administered 2023-03-02: 50 mg via ORAL
  Filled 2023-03-02: qty 1

## 2023-03-02 MED ORDER — METOPROLOL TARTRATE 5 MG/5ML IV SOLN: 5.0000 mg | INTRAVENOUS | Status: DC | PRN

## 2023-03-02 MED ORDER — SODIUM CHLORIDE 0.9 % IV SOLN: INTRAVENOUS | Status: AC

## 2023-03-02 MED ORDER — ONDANSETRON HCL 4 MG/2ML IJ SOLN: 4.0000 mg | Freq: Four times a day (QID) | INTRAMUSCULAR | Status: DC | PRN

## 2023-03-02 MED ORDER — IPRATROPIUM-ALBUTEROL 0.5-2.5 (3) MG/3ML IN SOLN: 3.0000 mL | RESPIRATORY_TRACT | Status: DC | PRN

## 2023-03-02 MED ORDER — AMOXICILLIN-POT CLAVULANATE 500-125 MG PO TABS
1.0000 | ORAL_TABLET | Freq: Two times a day (BID) | ORAL | Status: DC
  Administered 2023-03-02 – 2023-03-03 (×3): 1 via ORAL
  Filled 2023-03-02 (×3): qty 1

## 2023-03-02 MED ORDER — ORAL CARE MOUTH RINSE: 15.0000 mL | OROMUCOSAL | Status: DC | PRN

## 2023-03-02 MED ORDER — SENNOSIDES-DOCUSATE SODIUM 8.6-50 MG PO TABS
2.0000 | ORAL_TABLET | Freq: Every day | ORAL | Status: DC
  Filled 2023-03-02: qty 2

## 2023-03-02 MED ORDER — SENNOSIDES-DOCUSATE SODIUM 8.6-50 MG PO TABS: 1.0000 | ORAL_TABLET | Freq: Every evening | ORAL | Status: DC | PRN

## 2023-03-02 MED ORDER — SODIUM CHLORIDE 0.9 % IV SOLN
100.0000 mg | Freq: Once | INTRAVENOUS | Status: AC
  Administered 2023-03-02: 100 mg via INTRAVENOUS
  Filled 2023-03-02: qty 5

## 2023-03-02 MED ORDER — HYDRALAZINE HCL 20 MG/ML IJ SOLN: 10.0000 mg | INTRAMUSCULAR | Status: DC | PRN

## 2023-03-02 MED ORDER — GUAIFENESIN 100 MG/5ML PO LIQD: 5.0000 mL | ORAL | Status: DC | PRN

## 2023-03-02 MED ORDER — FERROUS SULFATE 325 (65 FE) MG PO TABS: 325.0000 mg | ORAL_TABLET | Freq: Every day | ORAL | Status: DC

## 2023-03-02 MED ORDER — ACETAMINOPHEN 325 MG PO TABS: 650.0000 mg | ORAL_TABLET | Freq: Four times a day (QID) | ORAL | Status: DC | PRN

## 2023-03-02 NOTE — Telephone Encounter (Signed)
Further PT apts are canceled. Patient comes in to see Dr. Lajoyce Corners on 11/18 for follow up. We can discuss crutches at that time.

## 2023-03-02 NOTE — Progress Notes (Signed)
Mobility Specialist - Progress Note   03/02/23 0919  Mobility  Activity Ambulated with assistance in hallway  Level of Assistance Contact guard assist, steadying assist  Assistive Device Crutches  Distance Ambulated (ft) 160 ft  Activity Response Tolerated well  Mobility Referral Yes  $Mobility charge 1 Mobility  Mobility Specialist Start Time (ACUTE ONLY) 0854  Mobility Specialist Stop Time (ACUTE ONLY) 0907  Mobility Specialist Time Calculation (min) (ACUTE ONLY) 13 min   Pt received in bed and agreeable to mobility. Pt was minA from supine > sitting & CG during ambulation. Once standing, pt had some LOB that was corrected with the gait belt. No complaints during session. Pt to EOB after session with all needs met.   [facility]

## 2023-03-02 NOTE — Hospital Course (Addendum)
  Brief Narrative:  Mark [name]. is a 67 y.o. male with medical history significant for alcohol abuse, alcoholic cirrhosis, history of CVA, coronary artery disease, anemia of chronic disease, who presents to the ED from home due to 2 days of right upper quadrant pain associated with nausea and vomiting.  Workup revealed obstructive choledocholithiasis, acute cholecystitis and acute gallstone pancreatitis.  Patient underwent ERCP on 11/2 by [facility], sphincterotomy and removal of stones was performed.  General surgery following, will determine timing of surgery.  Currently on empiric Zosyn, IV fluids.  Pancreatitis seems to be improving.       Assessment & Plan:   Principal Problem:   Choledocholithiasis Active Problems:   Cirrhosis, alcoholic ([name])   S/P right TKA   Cholelithiasis with biliary obstruction   HTN (hypertension)   Hypokalemia    Nausea vomiting secondary to acute choledocholithiasis with biliary obstruction Acute cholecystitis and gallstone pancreatitis MRCP revealed obstructive pathology.  Underwent ERCP on 11/2 and sphincterotomy/clot/stone removal was performed.  Symptomatically patient seems to be improving on IV Zosyn and IV fluids at this time.  High risk patient therefore general surgery not recommending surgery at this time.   AKI Baseline creatinine 0.6, slowly trending upwards.  Today is 1.88.  Will continue IV fluids.  I will also check bladder scan to rule out any retention.  If necessary we can get renal ultrasound.  Anemia of chronic disease - Iron studies appear to be borderline low.  Will give IV iron and upon discharge can get p.o.   Alcoholic cirrhosis Overall appears to be stable but will closely monitor this   Alcohol abuse with concern for withdrawal Last drink reported 10/29.  Alcohol withdrawal protocol.   Hypertension -Norvasc, hydralazine.  IV as needed       DVT prophylaxis: SCDs  Code Status: Full code Family Communication: None  at bedside; updated wife over the phone 11/2 Disposition Plan: Home Status is: Inpatient Remains inpatient appropriate because: Awaiting AKI to resolve or improve  Subjective: Patient does not have any complaints at this time.  Tells me his abdominal pain is better.  Examination:  General exam: Appears calm  Respiratory system: Clear to auscultation. Respiratory effort normal. No respiratory distress. No conversational dyspnea.  Cardiovascular system: S1 & S2 heard, RRR. No murmurs. No pedal edema. Gastrointestinal system: Abdomen is nondistended, soft, nontender Central nervous system: Alert and oriented. No focal neurological deficits. Speech clear.  Skin: No rashes, lesions or ulcers on exposed skin  Psychiatry: Judgement and insight appear normal. Mood & affect appropriate.

## 2023-03-02 NOTE — Progress Notes (Addendum)
[facility] Progress Note  CC: Choledocholithiasis, cirrhosis, elevated LFTs  Subjective: He denies having any nausea or vomiting. No abdominal pain. He stated passing 3 solid black stools yesterday. No bright red rectal bleeding. No BM today. He thought he was going to have gallbladder surgery today. No CP or SOB. He was drinking 40 oz of beer daily, last alcohol intake was 02/24/2023. He stated he was going to stop drinking beer/alcohol. He wants to go home.    Objective:   ERCP 02/28/2023: - The major papilla appeared normal.  - The minor papilla appeared normal.  - The common bile duct was moderately dilated, acquired.  - A biliary sphincterotomy was performed.  - The biliary tree was swept and clots and debris were found.  Vital signs in last 24 hours: Temp:  [99.2 F (37.3 C)-99.9 F (37.7 C)] 99.4 F (37.4 C) (11/04 0630) Pulse Rate:  [97-108] 97 (11/04 0630) Resp:  [18] 18 (11/04 0630) BP: (133-142)/(58-68) 140/68 (11/04 0630) SpO2:  [96 %-99 %] 99 % (11/04 0630) Last BM Date : 02/26/23 General: Alert 67 year old male in NAD. Heart: RRR, systolic murmur.  Pulm: Breath  sounds clear throughout.  Abdomen: Soft, nontender. Nondistended.  No hepatosplenomegaly.  No masses.  No bruit.  Positive bowel sounds all 4 quadrants. Extremities: No edema. Right AKA. Neurologic:  Alert and  oriented x 4.  Speech is clear.  Moves all extremities.  No asterixis. Psych:  Alert and cooperative. Normal mood and affect.  Intake/Output from previous day: 11/03 0701 - 11/04 0700 In: 2390 [P.O.:720; I.V.:1395.2; IV Piggyback:274.8] Out: 1200 [Urine:1200] Intake/Output this shift: No intake/output data recorded.  Lab Results: Recent Labs    02/28/23 0456 03/02/23 0414  WBC 10.2 6.5  HGB 11.5* 8.1*  HCT 34.0* 23.6*  PLT 234 153   BMET Recent Labs    02/28/23 0456 03/01/23 0516 03/02/23 0414  NA 140 140 140  K 3.4* 3.7 3.5  CL 103 111 110  CO2 23 20* 20*   GLUCOSE 82 93 87  BUN 17 47* 50*  CREATININE 1.06 1.71* 1.88*  CALCIUM 8.6* 7.1* 7.7*   LFT Recent Labs    02/28/23 0456 03/01/23 0516 03/02/23 0414  PROT 7.2   < > 6.0*  ALBUMIN 3.3*   < > 2.4*  AST 77*   < > 60*  ALT 39   < > 34  ALKPHOS 155*   < > 154*  BILITOT 8.9*   < > 2.5*  BILIDIR 5.5*  --   --    < > = values in this interval not displayed.   PT/INR Recent Labs    02/27/23 0948  LABPROT 15.9*  INR 1.3*   Hepatitis Panel Recent Labs    03/01/23 1116  HEPBSAG NON REACTIVE    DG ERCP  Result Date: 02/28/2023 CLINICAL DATA:  Concern for acute cholecystitis and choledocholithiasis. EXAM: ERCP TECHNIQUE: Multiple spot images obtained with the fluoroscopic device and submitted for interpretation post-procedure. FLUOROSCOPY TIME: FLUOROSCOPY TIME 3 minutes, 47 seconds (41.8 mGy) COMPARISON:  MRCP-02/27/2023; CT abdomen pelvis-02/26/2023 FINDINGS: Seventeen spot fluoroscopic images of the right upper abdominal quadrant during ERCP are provided for review Initial image demonstrates an ERCP probe overlying the right upper abdominal quadrant. Subsequent images demonstrate selective cannulation of initially the pancreatic duct and ultimately the common bile duct. Subsequent images demonstrate opacification of the common bile duct which appears mildly dilated. Subsequent images demonstrate insufflation of a balloon within the central  aspect of the CBD with presumed biliary sweeping and stone extraction. There is faint opacification of the cystic duct with opacification of the gallbladder lumen which appears irregular at the level of the gallbladder neck. IMPRESSION: ERCP with presumed biliary sweeping and sphincterotomy as detailed above. These images were submitted for radiologic interpretation only. Please see the procedural report for the amount of contrast and the fluoroscopy time utilized. Electronically Signed   By: Simonne Come M.D.   On: 02/28/2023 14:42    Brief  Narrative: 67 year old African-American male patient with history of alcohol abuse, alcoholic cirrhosis, history of CVA, coronary artery disease, anemia of chronic disease,  that presents to ED with nausea, vomiting, RUQ pain. CT abd/pelvis shows cholelithiasis and nonobstructing stone in the central CBD with dilatation of the biliary tree and findings concerning for acute cholecystitis.  MRCP pending.   Assessment / Plan:  67 year old male admitted to the hospital 02/27/2023 with N/V and RUQ pain. CTAP showed cholelithiasis and nonobstructing stone in the central CBD with dilatation of the biliary tree and findings concerning for acute cholecystitis. Abdominal MRI/MRCP 11/1 showed intra/extrahepatic biliary ductal dilatation with choledocholithiasis in the CBD and gallbladder hydrops with gallbladder wall thickening suspicious for acute cholecystitis, peripancreatic edema/mild peripancreatic acute fluid collection suggestive of possible acute pancreatitis, irregular hepatic contour consistent with cirrhosis and varices along the distal esophagus consistent with portal venous hypertension and mild perihepatic ascites. S/P ERCP with sphincterotomy 11/2 showed the CBD was moderately dilated, the biliary tree was swept and clots and debris were found. Unable to calculate accurate MELD score in setting of biliary obstruction. T. Bili 8.9 -> 4.0 -> 2.5. Alk phos 155 -> 137 -> 154. AST 77 -> 54 -> 60. ALT 39 -> 31 -> 34.  Hep B surface antigen negative.  Hep C V antibody pending.  AMA and SMA pending. Pancreatitis improving.  Lipase 316 -> 87.on Zosyn IV.  Evaluated by general surgery, no indication for cholecystectomy at this juncture high risk for perioperative complications secondary to cirrhosis. -CBC, BMP, INR and hepatic panel in am, calculate MELD score -Recommend heart healthy diet  -Ondansetron 4 mg p.o. or IV every 6 hours as needed  Iron deficiency anemia. Hg 11.1 -> 11.5 -> today dropped down to Hg  8. concerning for post sphincterotomy bleeding.  Patient endorsed passing 3 formed black stools yesterday.  No BM today.  Rectal exam today showed solid dark brown stool in the rectal vault, stool card sent to lab for guaiac testing.  Received IV iron today and remains on oral ferrous sulfate. Hemodynamically stable.  -H/H now -CBC in am -FOBT -Monitor the patient closely for active GI bleeding  History of alcohol associated cirrhosis  -CIWA protocol in process -Patient counseled complete alcohol abstinence -Follow up in our GI clinic post hospital discharge   AKI. Cr 0.58 -> 1.06 -> 1.71 -> 1.88.   History of CAD  History of CVA  . Principal Problem:   Choledocholithiasis Active Problems:   Cirrhosis, alcoholic ([name])   S/P right TKA   Cholelithiasis with biliary obstruction   HTN (hypertension)   Hypokalemia     LOS: 3 days   Mark [name]  03/02/2023, 1:48PM  GI ATTENDING  Interval history data reviewed.  Patient seen and examined.  Agree with interval progress note as outlined above.  The patient presents with symptomatic choledocholithiasis.  Now status post ERCP with sphincterotomy and stone extraction.  Apparently had bleeding at the time of his procedure (  clot said to be extracted).  I suspect that his drop in hemoglobin is secondary to transient postsphincterotomy bleeding.  This needs to be monitored.  If hemoglobin stable and stools normal in color, could be discharged home tomorrow.  Routine outpatient follow-up regarding his known cirrhosis with Dr. Leone Payor.  Wilhemina Bonito. Eda Keys., M.D. [facility] Division of Gastroenterology

## 2023-03-02 NOTE — Progress Notes (Signed)
PROGRESS NOTE    Mark [name].  ZOX:[id number] DOB: 1956-01-29 DOA: 02/26/2023 PCP: Deeann Saint, MD     Brief Narrative:  Mark [name]. is a 67 y.o. male with medical history significant for alcohol abuse, alcoholic cirrhosis, history of CVA, coronary artery disease, anemia of chronic disease, who presents to the ED from home due to 2 days of right upper quadrant pain associated with nausea and vomiting.  Workup revealed obstructive choledocholithiasis, acute cholecystitis and acute gallstone pancreatitis.  Patient underwent ERCP on 11/2 by [facility], sphincterotomy and removal of stones was performed.  General surgery following, will determine timing of surgery.  Currently on empiric Zosyn, IV fluids.  Pancreatitis seems to be improving.       Assessment & Plan:   Principal Problem:   Choledocholithiasis Active Problems:   Cirrhosis, alcoholic ([name])   S/P right TKA   Cholelithiasis with biliary obstruction   HTN (hypertension)   Hypokalemia    Nausea vomiting secondary to acute choledocholithiasis with biliary obstruction Acute cholecystitis and gallstone pancreatitis MRCP revealed obstructive pathology.  Underwent ERCP on 11/2 and sphincterotomy/clot/stone removal was performed.  Symptomatically patient seems to be improving on IV Zosyn and IV fluids at this time.  High risk patient therefore general surgery not recommending surgery at this time.   AKI Baseline creatinine 0.6, slowly trending upwards.  Today is 1.88.  Will continue IV fluids.  I will also check bladder scan to rule out any retention.  If necessary we can get renal ultrasound.  Anemia of chronic disease - Iron studies appear to be borderline low.  Will give IV iron and upon discharge can get p.o.   Alcoholic cirrhosis Overall appears to be stable but will closely monitor this   Alcohol abuse with concern for withdrawal Last drink reported 10/29.  Alcohol withdrawal protocol.    Hypertension -Norvasc, hydralazine.  IV as needed       DVT prophylaxis: SCDs  Code Status: Full code Family Communication: None at bedside; updated wife over the phone 11/2 Disposition Plan: Home Status is: Inpatient Remains inpatient appropriate because: Awaiting AKI to resolve or improve  Subjective: Patient does not have any complaints at this time.  Tells me his abdominal pain is better.  Examination:  General exam: Appears calm  Respiratory system: Clear to auscultation. Respiratory effort normal. No respiratory distress. No conversational dyspnea.  Cardiovascular system: S1 & S2 heard, RRR. No murmurs. No pedal edema. Gastrointestinal system: Abdomen is nondistended, soft, nontender Central nervous system: Alert and oriented. No focal neurological deficits. Speech clear.  Skin: No rashes, lesions or ulcers on exposed skin  Psychiatry: Judgement and insight appear normal. Mood & affect appropriate.             Diet Orders (From admission, onward)     Start     Ordered   02/28/23 1515  Diet regular Fluid consistency: Thin  Diet effective now       Question:  Fluid consistency:  Answer:  Thin   02/28/23 1514            Objective: Vitals:   03/01/23 0633 03/01/23 1329 03/01/23 2113 03/02/23 0630  BP: (!) 141/70 (!) 133/58 (!) 142/64 (!) 140/68  Pulse: 89 (!) 108 (!) 107 97  Resp: 18 18 18 18   Temp: 98.3 F (36.8 C) 99.2 F (37.3 C) 99.9 F (37.7 C) 99.4 F (37.4 C)  TempSrc: Oral  Oral Oral  SpO2: 98% 99% 96% 99%  Weight:      Height:        Intake/Output Summary (Last 24 hours) at 03/02/2023 1243 Last data filed at 03/02/2023 0916 Gross per 24 hour  Intake 2269.98 ml  Output 1500 ml  Net 769.98 ml   Filed Weights   02/26/23 2025 02/28/23 1257  Weight: 54.4 kg 54.4 kg    Scheduled Meds:  amLODipine  10 mg Oral Daily   [START ON 03/04/2023] ferrous sulfate  325 mg Oral Q breakfast   folic acid  1 mg Oral Daily   hydrALAZINE  10 mg Oral  Q6H   multivitamin with minerals  1 tablet Oral Daily   senna-docusate  2 tablet Oral QHS   thiamine  100 mg Oral Daily   Or   thiamine  100 mg Intravenous Daily   Continuous Infusions:  sodium chloride     piperacillin-tazobactam 3.375 g (03/02/23 0759)    Nutritional status     Body mass index is 18.24 kg/m.  Data Reviewed:   CBC: Recent Labs  Lab 02/26/23 2054 02/27/23 0713 02/28/23 0456 03/02/23 0414  WBC 5.3 6.3 10.2 6.5  HGB 10.8* 11.1* 11.5* 8.1*  HCT 31.7* 32.6* 34.0* 23.6*  MCV 97.8 96.4 97.7 97.1  PLT 192 185 234 153   Basic Metabolic Panel: Recent Labs  Lab 02/26/23 2054 02/27/23 0713 02/28/23 0456 03/01/23 0516 03/02/23 0414  NA 138 140 140 140 140  K 3.9 3.8 3.4* 3.7 3.5  CL 106 102 103 111 110  CO2 24 22 23  20* 20*  GLUCOSE 121* 128* 82 93 87  BUN 8 8 17  47* 50*  CREATININE 0.67 0.58* 1.06 1.71* 1.88*  CALCIUM 8.8* 9.0 8.6* 7.1* 7.7*  MG  --  1.9  --  2.5*  --   PHOS  --  3.2  --   --   --    GFR: Estimated Creatinine Clearance: 28.5 mL/min (A) (by C-G formula based on SCr of 1.88 mg/dL (H)). Liver Function Tests: Recent Labs  Lab 02/26/23 2054 02/27/23 0713 02/28/23 0456 03/01/23 0516 03/02/23 0414  AST 90* 80* 77* 54* 60*  ALT 40 40 39 31 34  ALKPHOS 158* 160* 155* 137* 154*  BILITOT 6.8* 7.7* 8.9* 4.0* 2.5*  PROT 7.5 7.5 7.2 6.3* 6.0*  ALBUMIN 3.4* 3.3* 3.3* 2.6* 2.4*   Recent Labs  Lab 02/26/23 2054 02/28/23 1012 03/01/23 0516  LIPASE 25 316* 87*   No results for input(s): "AMMONIA" in the last 168 hours. Coagulation Profile: Recent Labs  Lab 02/27/23 0948  INR 1.3*   Cardiac Enzymes: No results for input(s): "CKTOTAL", "CKMB", "CKMBINDEX", "TROPONINI" in the last 168 hours. BNP (last 3 results) Recent Labs    03/10/22 1342  PROBNP 542.0*   HbA1C: No results for input(s): "HGBA1C" in the last 72 hours. CBG: No results for input(s): "GLUCAP" in the last 168 hours. Lipid Profile: No results for input(s):  "CHOL", "HDL", "LDLCALC", "TRIG", "CHOLHDL", "LDLDIRECT" in the last 72 hours. Thyroid Function Tests: No results for input(s): "TSH", "T4TOTAL", "FREET4", "T3FREE", "THYROIDAB" in the last 72 hours. Anemia Panel: Recent Labs    02/27/23 1349 02/28/23 0456 03/01/23 1116  VITAMINB12 1,298*  --   --   FOLATE  --  16.2  --   FERRITIN 106  --  79  TIBC 295  --  228*  IRON 36*  --  24*   Sepsis Labs: No results for input(s): "PROCALCITON", "LATICACIDVEN" in the last 168 hours.  Recent Results (  from the past 240 hour(s))  Culture, blood (Routine X 2) w Reflex to ID Panel     Status: None (Preliminary result)   Collection Time: 02/27/23  7:11 AM   Specimen: BLOOD  Result Value Ref Range Status   Specimen Description   Final    BLOOD BLOOD RIGHT HAND Performed at [facility], [address] [place], [place] [address]    Special Requests   Final    BOTTLES DRAWN AEROBIC AND ANAEROBIC Blood Culture adequate volume Performed at [facility], [address] [place], [place] [address]    Culture   Final    NO GROWTH 2 DAYS Performed at [facility], [address] [place], [place] [address]    Report Status PENDING  Incomplete  Culture, blood (Routine X 2) w Reflex to ID Panel     Status: None (Preliminary result)   Collection Time: 02/27/23  7:13 AM   Specimen: BLOOD  Result Value Ref Range Status   Specimen Description   Final    BLOOD BLOOD LEFT ARM Performed at [facility], [address] [place], [place] [address]    Special Requests   Final    BOTTLES DRAWN AEROBIC AND ANAEROBIC Blood Culture adequate volume Performed at [facility], [address], [place], [place] [address]    Culture   Final    NO GROWTH 2 DAYS Performed at [facility], [address] [place], [place] [address]    Report Status PENDING  Incomplete         Radiology Studies: DG ERCP  Result Date:  02/28/2023 CLINICAL DATA:  Concern for acute cholecystitis and choledocholithiasis. EXAM: ERCP TECHNIQUE: Multiple spot images obtained with the fluoroscopic device and submitted for interpretation post-procedure. FLUOROSCOPY TIME: FLUOROSCOPY TIME 3 minutes, 47 seconds (41.8 mGy) COMPARISON:  MRCP-02/27/2023; CT abdomen pelvis-02/26/2023 FINDINGS: Seventeen spot fluoroscopic images of the right upper abdominal quadrant during ERCP are provided for review Initial image demonstrates an ERCP probe overlying the right upper abdominal quadrant. Subsequent images demonstrate selective cannulation of initially the pancreatic duct and ultimately the common bile duct. Subsequent images demonstrate opacification of the common bile duct which appears mildly dilated. Subsequent images demonstrate insufflation of a balloon within the central aspect of the CBD with presumed biliary sweeping and stone extraction. There is faint opacification of the cystic duct with opacification of the gallbladder lumen which appears irregular at the level of the gallbladder neck. IMPRESSION: ERCP with presumed biliary sweeping and sphincterotomy as detailed above. These images were submitted for radiologic interpretation only. Please see the procedural report for the amount of contrast and the fluoroscopy time utilized. Electronically Signed   By: Simonne Come M.D.   On: 02/28/2023 14:42           LOS: 3 days   Time spent= 35 mins    Mark Rota, MD [facility]  If 7PM-7AM, please contact night-coverage  03/02/2023, 12:43 PM

## 2023-03-02 NOTE — Progress Notes (Signed)
2 Days Post-Op   Subjective/Chief Complaint: No abdominal pain Hgb dropped over 3 grams to 8.1 yesterday - unclear source, although blood clots were seen in the CBD at the time of ERCP. WBC normal Total bili improving/ AST, alk phos remain elevated Cirrhosis work-up ongoing  Objective: Vital signs in last 24 hours: Temp:  [99.2 F (37.3 C)-99.9 F (37.7 C)] 99.4 F (37.4 C) (11/04 0630) Pulse Rate:  [97-108] 97 (11/04 0630) Resp:  [18] 18 (11/04 0630) BP: (133-142)/(58-68) 140/68 (11/04 0630) SpO2:  [96 %-99 %] 99 % (11/04 0630) Last BM Date : 03/01/23 (per patient)  Intake/Output from previous day: 11/03 0701 - 11/04 0700 In: 2390 [P.O.:720; I.V.:1395.2; IV Piggyback:274.8] Out: 1200 [Urine:1200] Intake/Output this shift: Total I/O In: -  Out: 300 [Urine:300]    Lab Results:  Recent Labs    02/28/23 0456 03/02/23 0414  WBC 10.2 6.5  HGB 11.5* 8.1*  HCT 34.0* 23.6*  PLT 234 153   BMET Recent Labs    03/01/23 0516 03/02/23 0414  NA 140 140  K 3.7 3.5  CL 111 110  CO2 20* 20*  GLUCOSE 93 87  BUN 47* 50*  CREATININE 1.71* 1.88*  CALCIUM 7.1* 7.7*      Latest Ref Rng & Units 03/02/2023    4:14 AM 03/01/2023    5:16 AM 02/28/2023    4:56 AM  Hepatic Function  Total Protein 6.5 - 8.1 g/dL 6.0  6.3  7.2   Albumin 3.5 - 5.0 g/dL 2.4  2.6  3.3   AST 15 - 41 U/L 60  54  77   ALT 0 - 44 U/L 34  31  39   Alk Phosphatase 38 - 126 U/L 154  137  155   Total Bilirubin <1.2 mg/dL 2.5  4.0  8.9   Bilirubin, Direct 0.0 - 0.2 mg/dL   5.5      Studies/Results: DG ERCP  Result Date: 02/28/2023 CLINICAL DATA:  Concern for acute cholecystitis and choledocholithiasis. EXAM: ERCP TECHNIQUE: Multiple spot images obtained with the fluoroscopic device and submitted for interpretation post-procedure. FLUOROSCOPY TIME: FLUOROSCOPY TIME 3 minutes, 47 seconds (41.8 mGy) COMPARISON:  MRCP-02/27/2023; CT abdomen pelvis-02/26/2023 FINDINGS: Seventeen spot fluoroscopic images of  the right upper abdominal quadrant during ERCP are provided for review Initial image demonstrates an ERCP probe overlying the right upper abdominal quadrant. Subsequent images demonstrate selective cannulation of initially the pancreatic duct and ultimately the common bile duct. Subsequent images demonstrate opacification of the common bile duct which appears mildly dilated. Subsequent images demonstrate insufflation of a balloon within the central aspect of the CBD with presumed biliary sweeping and stone extraction. There is faint opacification of the cystic duct with opacification of the gallbladder lumen which appears irregular at the level of the gallbladder neck. IMPRESSION: ERCP with presumed biliary sweeping and sphincterotomy as detailed above. These images were submitted for radiologic interpretation only. Please see the procedural report for the amount of contrast and the fluoroscopy time utilized. Electronically Signed   By: Simonne Come M.D.   On: 02/28/2023 14:42    Anti-infectives: Anti-infectives (From admission, onward)    Start     Dose/Rate Route Frequency Ordered Stop   02/27/23 0800  piperacillin-tazobactam (ZOSYN) IVPB 3.375 g        3.375 g 12.5 mL/hr over 240 Minutes Intravenous Every 8 hours 02/27/23 0511     02/27/23 0030  piperacillin-tazobactam (ZOSYN) IVPB 3.375 g        3.375 g  100 mL/hr over 30 Minutes Intravenous  Once 02/27/23 0024 02/27/23 0140       Assessment/Plan: Choledocholithiasis with jaundice s/p ERCP and evacuation of debris/ blood clot Acute blood loss anemia of unclear etiology Cholelithiasis No signs of acute cholecystitis Pancreatitis with peripancreatic fluid Abdominal pain resolved Cirrhosis with portal hypertension, esophageal varices and collateral vessels  No strong indications for surgery in this patient with high-risk for perioperative complications due to EtOH cirrhosis.  Ongoing evaluation by GI. We will sign off for now.  Please call  us back if needed.  LOS: 3 days    Mark [name] 03/02/2023

## 2023-03-03 DIAGNOSIS — K8050 Calculus of bile duct without cholangitis or cholecystitis without obstruction: Secondary | ICD-10-CM | POA: Diagnosis not present

## 2023-03-03 LAB — COMPREHENSIVE METABOLIC PANEL
ALT: 35 U/L (ref 0–44)
AST: 55 U/L — ABNORMAL HIGH (ref 15–41)
Albumin: 2.4 g/dL — ABNORMAL LOW (ref 3.5–5.0)
Alkaline Phosphatase: 157 U/L — ABNORMAL HIGH (ref 38–126)
Anion gap: 8 (ref 5–15)
BUN: 40 mg/dL — ABNORMAL HIGH (ref 8–23)
CO2: 17 mmol/L — ABNORMAL LOW (ref 22–32)
Calcium: 7.9 mg/dL — ABNORMAL LOW (ref 8.9–10.3)
Chloride: 115 mmol/L — ABNORMAL HIGH (ref 98–111)
Creatinine, Ser: 1.44 mg/dL — ABNORMAL HIGH (ref 0.61–1.24)
GFR, Estimated: 53 mL/min — ABNORMAL LOW (ref >60)
Glucose, Bld: 140 mg/dL — ABNORMAL HIGH (ref 70–99)
Potassium: 3.4 mmol/L — ABNORMAL LOW (ref 3.5–5.1)
Sodium: 140 mmol/L (ref 135–145)
Total Bilirubin: 1.7 mg/dL — ABNORMAL HIGH (ref <1.2)
Total Protein: 5.9 g/dL — ABNORMAL LOW (ref 6.5–8.1)

## 2023-03-03 LAB — CBC
HCT: 24.7 % — ABNORMAL LOW (ref 39.0–52.0)
Hemoglobin: 8.1 g/dL — ABNORMAL LOW (ref 13.0–17.0)
MCH: 32.5 pg (ref 26.0–34.0)
MCHC: 32.8 g/dL (ref 30.0–36.0)
MCV: 99.2 fL (ref 80.0–100.0)
Platelets: 159 10*3/uL (ref 150–400)
RBC: 2.49 MIL/uL — ABNORMAL LOW (ref 4.22–5.81)
RDW: 14.4 % (ref 11.5–15.5)
WBC: 5.8 10*3/uL (ref 4.0–10.5)
nRBC: 0 % (ref 0.0–0.2)

## 2023-03-03 LAB — PHOSPHORUS: Phosphorus: <1 mg/dL — CL (ref 2.5–4.6)

## 2023-03-03 LAB — ANTI-SMOOTH MUSCLE ANTIBODY, IGG: F-Actin IgG: 6 U (ref 0–19)

## 2023-03-03 LAB — MAGNESIUM: Magnesium: 2.4 mg/dL (ref 1.7–2.4)

## 2023-03-03 LAB — HCV INTERPRETATION

## 2023-03-03 LAB — HCV AB W REFLEX TO QUANT PCR: HCV Ab: NONREACTIVE

## 2023-03-03 LAB — MITOCHONDRIAL ANTIBODIES: Mitochondrial M2 Ab, IgG: 143 U — ABNORMAL HIGH (ref 0.0–20.0)

## 2023-03-03 MED ORDER — AMLODIPINE BESYLATE 10 MG PO TABS: 10.0000 mg | ORAL_TABLET | Freq: Every day | ORAL | 0 refills | Status: AC

## 2023-03-03 MED ORDER — AMOXICILLIN-POT CLAVULANATE 500-125 MG PO TABS: 1.0000 | ORAL_TABLET | Freq: Two times a day (BID) | ORAL | 0 refills | Status: AC

## 2023-03-03 MED ORDER — HYDROCODONE-ACETAMINOPHEN 5-325 MG PO TABS: 1.0000 | ORAL_TABLET | Freq: Four times a day (QID) | ORAL | 0 refills | Status: AC | PRN

## 2023-03-03 MED ORDER — ONDANSETRON 4 MG PO TBDP: 4.0000 mg | ORAL_TABLET | Freq: Three times a day (TID) | ORAL | 0 refills | Status: AC | PRN

## 2023-03-03 MED ORDER — K PHOS MONO-SOD PHOS DI & MONO 155-852-130 MG PO TABS: 500.0000 mg | ORAL_TABLET | Freq: Two times a day (BID) | ORAL | 0 refills | Status: AC

## 2023-03-03 MED ORDER — POTASSIUM PHOSPHATES 15 MMOLE/5ML IV SOLN
30.0000 mmol | Freq: Once | INTRAVENOUS | Status: DC
  Filled 2023-03-03: qty 10

## 2023-03-03 MED ORDER — VITAMIN B-1 100 MG PO TABS: 100.0000 mg | ORAL_TABLET | Freq: Every day | ORAL | 0 refills | Status: AC

## 2023-03-03 MED ORDER — FOLIC ACID 1 MG PO TABS: 1.0000 mg | ORAL_TABLET | Freq: Every day | ORAL | 0 refills | Status: AC

## 2023-03-03 MED ORDER — SENNOSIDES-DOCUSATE SODIUM 8.6-50 MG PO TABS: 2.0000 | ORAL_TABLET | Freq: Every evening | ORAL | 0 refills | Status: AC | PRN

## 2023-03-03 MED ORDER — K PHOS MONO-SOD PHOS DI & MONO 155-852-130 MG PO TABS
500.0000 mg | ORAL_TABLET | Freq: Once | ORAL | Status: AC
  Administered 2023-03-03: 500 mg via ORAL
  Filled 2023-03-03: qty 2

## 2023-03-03 MED ORDER — ADULT MULTIVITAMIN W/MINERALS CH: 1.0000 | ORAL_TABLET | Freq: Every day | ORAL | Status: AC

## 2023-03-03 NOTE — Progress Notes (Signed)
Mobility Specialist - Progress Note   03/03/23 1004  Mobility  Activity Ambulated with assistance in hallway  Level of Assistance Standby assist, set-up cues, supervision of patient - no hands on  Assistive Device Crutches  Distance Ambulated (ft) 50 ft  Range of Motion/Exercises Active  Activity Response Tolerated fair  Mobility Referral Yes  $Mobility charge 1 Mobility  Mobility Specialist Start Time (ACUTE ONLY) 0945  Mobility Specialist Stop Time (ACUTE ONLY) 1004  Mobility Specialist Time Calculation (min) (ACUTE ONLY) 19 min   Pt was found in bed and agreeable to ambulate. Stated feeling weak. At EOS returned to sit EOB with all needs met. RN in room.  Billey Chang Mobility Specialist

## 2023-03-03 NOTE — Plan of Care (Signed)
  Problem: Education: Goal: Knowledge of General Education information will improve Description Including pain rating scale, medication(s)/side effects and non-pharmacologic comfort measures Outcome: Progressing   Problem: Health Behavior/Discharge Planning: Goal: Ability to manage health-related needs will improve Outcome: Progressing   Problem: Clinical Measurements: Goal: Ability to maintain clinical measurements within normal limits will improve Outcome: Progressing Goal: Will remain free from infection Outcome: Progressing Goal: Diagnostic test results will improve Outcome: Progressing   Problem: Activity: Goal: Risk for activity intolerance will decrease Outcome: Progressing   

## 2023-03-03 NOTE — Progress Notes (Signed)
Patient was given discharge instructions, and all questions were answered.  Patient was stable for discharge and was taken to the main exit by wheelchair. 

## 2023-03-03 NOTE — Discharge Summary (Signed)
Physician Discharge Summary  Mark [name]. GUY:[id number] DOB: February 25, 1956 DOA: 02/26/2023  PCP: Deeann Saint, MD  Admit date: 02/26/2023 Discharge date: 03/03/2023  Admitted From: Home Disposition: Home  Recommendations for Outpatient Follow-up:  Follow up with PCP in 1-2 weeks Please obtain BMP/CBC in one week your next doctors visit.  Counseled to quit drinking alcohol Oral Augmentin for 10 more days Pain medication, iron supplements and bowel regimen prescribed 2 more days of p.o. potassium phosphorus supplements   Discharge Condition: Stable CODE STATUS: Full code Diet recommendation: Heart healthy    Brief Narrative:  Mark Provence. is a 67 y.o. male with medical history significant for alcohol abuse, alcoholic cirrhosis, history of CVA, coronary artery disease, anemia of chronic disease, who presents to the ED from home due to 2 days of right upper quadrant pain associated with nausea and vomiting.  Workup revealed obstructive choledocholithiasis, acute cholecystitis and acute gallstone pancreatitis.  Patient underwent ERCP on 11/2 by [facility], sphincterotomy and removal of stones was performed.  General surgery following, will determine timing of surgery.  Currently on empiric Zosyn, IV fluids.  Pancreatitis seems to be improving.  Symptomatically started doing well, seen by general surgery.  He is adamant that he wants to go home today despite of his electrolyte abnormalities.  Overall he is feeling well.  Will discharge him with recommendations as below.       Assessment & Plan:   Principal Problem:   Choledocholithiasis Active Problems:   Cirrhosis, alcoholic ([name])   S/P right TKA   Cholelithiasis with biliary obstruction   HTN (hypertension)   Hypokalemia    Nausea vomiting secondary to acute choledocholithiasis with biliary obstruction Acute cholecystitis and gallstone pancreatitis MRCP revealed obstructive pathology.  Underwent ERCP on 11/2 and  sphincterotomy/clot/stone removal was performed.  Symptomatically patient seems to be improving on IV Zosyn and IV fluids at this time.  High risk patient therefore general surgery not recommending surgery at this time.  Transition to oral Augmentin upon discharge.   AKI Baseline creatinine 0.6, slowly trending upwards.  Creatinine peaked 1.88 improving after IV fluids.  Anemia of chronic disease - Iron studies appear to be borderline low.  Bowel regimen   Alcoholic cirrhosis Overall appears to be stable but will closely monitor this   Alcohol abuse with concern for withdrawal Last drink reported 10/29.  Alcohol withdrawal protocol.   Hypertension -Norvasc, hydralazine.  IV as needed  Hypokalemia/hypophosphatemia - Aggressive repletion.  Phosphorus quite low but patient is adamant that he does not want to stay in the hospital and would like to go home on p.o. supplements.       DVT prophylaxis: SCDs  Code Status: Full code Family Communication: None at bedside; updated wife over the phone 11/2 Disposition Plan: Home Status is: Inpatient Remains inpatient appropriate because: Discharge patient's request  Subjective: Seen and went at bedside.  No complaints.  Patient does not want to stay in the hospital today regardless of his electrolytes.  Examination:  General exam: Appears calm  Respiratory system: Clear to auscultation. Respiratory effort normal. No respiratory distress. No conversational dyspnea.  Cardiovascular system: S1 & S2 heard, RRR. No murmurs. No pedal edema. Gastrointestinal system: Abdomen is nondistended, soft, nontender Central nervous system: Alert and oriented. No focal neurological deficits. Speech clear.  Skin: No rashes, lesions or ulcers on exposed skin.  Right AKA noted Psychiatry: Judgement and insight appear normal. Mood & affect appropriate.    Discharge Diagnoses:  Principal  Problem:   Choledocholithiasis Active Problems:   Cirrhosis, alcoholic  ([name])   S/P right TKA   Cholelithiasis with biliary obstruction   HTN (hypertension)   Hypokalemia   Acute blood loss anemia        Discharge Exam: Vitals:   03/03/23 0300 03/03/23 0523  BP: (!) 147/60 (!) 153/61  Pulse: 94 97  Resp:  18  Temp:  99.3 F (37.4 C)  SpO2:  99%   Vitals:   03/02/23 1349 03/02/23 2007 03/03/23 0300 03/03/23 0523  BP: (!) 146/67 136/60 (!) 147/60 (!) 153/61  Pulse: 100 87 94 97  Resp: 18   18  Temp: 98.6 F (37 C) 98.2 F (36.8 C)  99.3 F (37.4 C)  TempSrc: Oral Oral  Oral  SpO2: 100% 99%  99%  Weight:      Height:          Discharge Instructions   Allergies as of 03/03/2023   No Known Allergies      Medication List     STOP taking these medications    doxycycline 100 MG tablet Commonly known as: VIBRA-TABS   naproxen sodium 220 MG tablet Commonly known as: ALEVE   oxyCODONE-acetaminophen 5-325 MG tablet Commonly known as: PERCOCET/ROXICET       TAKE these medications    amLODipine 10 MG tablet Commonly known as: NORVASC Take 1 tablet (10 mg total) by mouth daily.   amoxicillin-clavulanate 500-125 MG tablet Commonly known as: AUGMENTIN Take 1 tablet by mouth every 12 (twelve) hours for 10 days.   ferrous sulfate 325 (65 FE) MG tablet Take 1 tablet (325 mg total) by mouth 2 (two) times daily with a meal. Take for two weeks as tolerated. What changed:  when to take this additional instructions   folic acid 1 MG tablet Commonly known as: FOLVITE Take 1 tablet (1 mg total) by mouth daily.   furosemide 20 MG tablet Commonly known as: LASIX TAKE 1 TABLET(20 MG) BY MOUTH TWICE DAILY What changed: See the new instructions.   HYDROcodone-acetaminophen 5-325 MG tablet Commonly known as: NORCO/VICODIN Take 1 tablet by mouth every 6 (six) hours as needed for moderate pain (pain score 4-6) or severe pain (pain score 7-10). What changed:  when to take this reasons to take this Another medication with the  same name was removed. Continue taking this medication, and follow the directions you see here.   multivitamin with minerals Tabs tablet Take 1 tablet by mouth daily.   naloxone 4 MG/0.1ML Liqd nasal spray kit Commonly known as: NARCAN Use as directed in case of overdose   ondansetron 4 MG disintegrating tablet Commonly known as: ZOFRAN-ODT Take 1 tablet (4 mg total) by mouth every 8 (eight) hours as needed for nausea or vomiting.   phosphorus 155-852-130 MG tablet Commonly known as: K PHOS NEUTRAL Take 2 tablets (500 mg total) by mouth 2 (two) times daily for 2 doses.   senna-docusate 8.6-50 MG tablet Commonly known as: Senokot-S Take 2 tablets by mouth at bedtime as needed for moderate constipation or mild constipation.   thiamine 100 MG tablet Commonly known as: Vitamin B-1 Take 1 tablet (100 mg total) by mouth daily.        No Known Allergies  You were cared for by a hospitalist during your hospital stay. If you have any questions about your discharge medications or the care you received while you were in the hospital after you are discharged, you can call the unit and asked  to speak with the hospitalist on call if the hospitalist that took care of you is not available. Once you are discharged, your primary care physician will handle any further medical issues. Please note that no refills for any discharge medications will be authorized once you are discharged, as it is imperative that you return to your primary care physician (or establish a relationship with a primary care physician if you do not have one) for your aftercare needs so that they can reassess your need for medications and monitor your lab values.  You were cared for by a hospitalist during your hospital stay. If you have any questions about your discharge medications or the care you received while you were in the hospital after you are discharged, you can call the unit and asked to speak with the hospitalist on  call if the hospitalist that took care of you is not available. Once you are discharged, your primary care physician will handle any further medical issues. Please note that NO REFILLS for any discharge medications will be authorized once you are discharged, as it is imperative that you return to your primary care physician (or establish a relationship with a primary care physician if you do not have one) for your aftercare needs so that they can reassess your need for medications and monitor your lab values.  Please request your Prim.MD to go over all Hospital Tests and Procedure/Radiological results at the follow up, please get all Hospital records sent to your Prim MD by signing hospital release before you go home.  Get CBC, CMP, 2 view Chest X ray checked  by Primary MD during your next visit or SNF MD in 5-7 days ( we routinely change or add medications that can affect your baseline labs and fluid status, therefore we recommend that you get the mentioned basic workup next visit with your PCP, your PCP may decide not to get them or add new tests based on their clinical decision)  On your next visit with your primary care physician please Get Medicines reviewed and adjusted.  If you experience worsening of your admission symptoms, develop shortness of breath, life threatening emergency, suicidal or homicidal thoughts you must seek medical attention immediately by calling 911 or calling your MD immediately  if symptoms less severe.  You Must read complete instructions/literature along with all the possible adverse reactions/side effects for all the Medicines you take and that have been prescribed to you. Take any new Medicines after you have completely understood and accpet all the possible adverse reactions/side effects.   Do not drive, operate heavy machinery, perform activities at heights, swimming or participation in water activities or provide baby sitting services if your were admitted for  syncope or siezures until you have seen by Primary MD or a Neurologist and advised to do so again.  Do not drive when taking Pain medications.   Procedures/Studies: DG ERCP  Result Date: 02/28/2023 CLINICAL DATA:  Concern for acute cholecystitis and choledocholithiasis. EXAM: ERCP TECHNIQUE: Multiple spot images obtained with the fluoroscopic device and submitted for interpretation post-procedure. FLUOROSCOPY TIME: FLUOROSCOPY TIME 3 minutes, 47 seconds (41.8 mGy) COMPARISON:  MRCP-02/27/2023; CT abdomen pelvis-02/26/2023 FINDINGS: Seventeen spot fluoroscopic images of the right upper abdominal quadrant during ERCP are provided for review Initial image demonstrates an ERCP probe overlying the right upper abdominal quadrant. Subsequent images demonstrate selective cannulation of initially the pancreatic duct and ultimately the common bile duct. Subsequent images demonstrate opacification of the common bile duct which appears mildly  dilated. Subsequent images demonstrate insufflation of a balloon within the central aspect of the CBD with presumed biliary sweeping and stone extraction. There is faint opacification of the cystic duct with opacification of the gallbladder lumen which appears irregular at the level of the gallbladder neck. IMPRESSION: ERCP with presumed biliary sweeping and sphincterotomy as detailed above. These images were submitted for radiologic interpretation only. Please see the procedural report for the amount of contrast and the fluoroscopy time utilized. Electronically Signed   By: Simonne Come M.D.   On: 02/28/2023 14:42   MR ABDOMEN MRCP W WO CONTAST  Result Date: 02/27/2023 CLINICAL DATA:  Biliary dilatation with suspected choledocholithiasis EXAM: MRI ABDOMEN WITHOUT AND WITH CONTRAST (INCLUDING MRCP) TECHNIQUE: Multiplanar multisequence MR imaging of the abdomen was performed both before and after the administration of intravenous contrast. Heavily T2-weighted images of the  biliary and pancreatic ducts were obtained, and three-dimensional MRCP images were rendered by post processing. Post-processing was applied at the acquisition scanner with concurrent physician supervision which includes 3D reconstructions, MIPs, volume rendered images and/or shaded surface rendering. CONTRAST:  5mL GADAVIST GADOBUTROL 1 MMOL/ML IV SOLN COMPARISON:  CT scan 02/26/2023 FINDINGS: Lower chest: Linear atelectasis or scarring posteriorly in the left lower lobe. Uphill varices along the esophageal margin for example on image 29 series 19. Hepatobiliary: Gallbladder hydrops, 7.2 cm in diameter, with irregularity and some thickening of the gallbladder wall suspicious for acute cholecystitis. Small depending gallstones in the gallbladder as on image 24 series 3. Intrahepatic and extrahepatic biliary dilatation, common hepatic duct 1.4 cm in diameter, common bile duct 1.5 cm in diameter, with dependent small filling defect/debris in the common bile duct favoring choledocholithiasis. The obstructive ampullary stone shown on CT is less obvious on MRI but is faintly appreciable for example on image 34 of series 3, thought to measure 2-3 mm in diameter. There is a small incidental focally dilated segment of the biliary tree in the left hepatic lobe with a saccular morphology on image 41 series 17. Subtle irregularity of the hepatic contour reason the possibility of cirrhosis. No worrisome hepatic mass. Pancreas: Mild dorsal pancreatic duct can dilatation extending to the ampulla. Peripancreatic edema, correlate with lipase levels in assessing for acute pancreatitis. The peripancreatic stranding and early acute peripancreatic fluid collections are increased from 02/26/2023 CT scan. Spleen:  Unremarkable Adrenals/Urinary Tract: 2.5 cm simple cyst of the left kidney lower pole. No further imaging workup of this lesion is indicated. Adrenal glands unremarkable.  Otherwise normal. Stomach/Bowel: Unremarkable  Vascular/Lymphatic: As noted above there are uphill varices along the distal esophagus favoring portal venous hypertension. Dilated inferior mesenteric vein likely with collateralization to the lower IVC. Atherosclerosis is present, including aortoiliac atherosclerotic disease. Other:  Some mild perihepatic ascites. Musculoskeletal: Fusion at L4-5. IMPRESSION: 1. Substantial intrahepatic and extrahepatic biliary dilatation, with dependent small filling defect/debris in the common bile duct favoring choledocholithiasis. The obstructive ampullary stone shown on CT is less obvious on MRI but is faintly appreciable, thought to measure 2-3 mm in diameter. 2. Gallbladder hydrops, with irregularity and some thickening of the gallbladder wall suspicious for acute cholecystitis. Small dependent gallstones in the gallbladder. 3. Peripancreatic edema and mild peripancreatic acute fluid collections, correlate with lipase levels in assessing for acute pancreatitis. These fluid collections are new compared to yesterday's CT scan. 4. Subtle irregularity of the hepatic contour reason the possibility of cirrhosis. 5. Upstream varices along the distal esophagus favoring portal venous hypertension. Dilated inferior mesenteric vein likely with collateralization to the  lower IVC. 6. Mild perihepatic ascites. 7. Aortic Atherosclerosis (ICD10-I70.0). Electronically Signed   By: Gaylyn Rong M.D.   On: 02/27/2023 17:00   MR 3D Recon At Scanner  Result Date: 02/27/2023 CLINICAL DATA:  Biliary dilatation with suspected choledocholithiasis EXAM: MRI ABDOMEN WITHOUT AND WITH CONTRAST (INCLUDING MRCP) TECHNIQUE: Multiplanar multisequence MR imaging of the abdomen was performed both before and after the administration of intravenous contrast. Heavily T2-weighted images of the biliary and pancreatic ducts were obtained, and three-dimensional MRCP images were rendered by post processing. Post-processing was applied at the acquisition  scanner with concurrent physician supervision which includes 3D reconstructions, MIPs, volume rendered images and/or shaded surface rendering. CONTRAST:  5mL GADAVIST GADOBUTROL 1 MMOL/ML IV SOLN COMPARISON:  CT scan 02/26/2023 FINDINGS: Lower chest: Linear atelectasis or scarring posteriorly in the left lower lobe. Uphill varices along the esophageal margin for example on image 29 series 19. Hepatobiliary: Gallbladder hydrops, 7.2 cm in diameter, with irregularity and some thickening of the gallbladder wall suspicious for acute cholecystitis. Small depending gallstones in the gallbladder as on image 24 series 3. Intrahepatic and extrahepatic biliary dilatation, common hepatic duct 1.4 cm in diameter, common bile duct 1.5 cm in diameter, with dependent small filling defect/debris in the common bile duct favoring choledocholithiasis. The obstructive ampullary stone shown on CT is less obvious on MRI but is faintly appreciable for example on image 34 of series 3, thought to measure 2-3 mm in diameter. There is a small incidental focally dilated segment of the biliary tree in the left hepatic lobe with a saccular morphology on image 41 series 17. Subtle irregularity of the hepatic contour reason the possibility of cirrhosis. No worrisome hepatic mass. Pancreas: Mild dorsal pancreatic duct can dilatation extending to the ampulla. Peripancreatic edema, correlate with lipase levels in assessing for acute pancreatitis. The peripancreatic stranding and early acute peripancreatic fluid collections are increased from 02/26/2023 CT scan. Spleen:  Unremarkable Adrenals/Urinary Tract: 2.5 cm simple cyst of the left kidney lower pole. No further imaging workup of this lesion is indicated. Adrenal glands unremarkable.  Otherwise normal. Stomach/Bowel: Unremarkable Vascular/Lymphatic: As noted above there are uphill varices along the distal esophagus favoring portal venous hypertension. Dilated inferior mesenteric vein likely with  collateralization to the lower IVC. Atherosclerosis is present, including aortoiliac atherosclerotic disease. Other:  Some mild perihepatic ascites. Musculoskeletal: Fusion at L4-5. IMPRESSION: 1. Substantial intrahepatic and extrahepatic biliary dilatation, with dependent small filling defect/debris in the common bile duct favoring choledocholithiasis. The obstructive ampullary stone shown on CT is less obvious on MRI but is faintly appreciable, thought to measure 2-3 mm in diameter. 2. Gallbladder hydrops, with irregularity and some thickening of the gallbladder wall suspicious for acute cholecystitis. Small dependent gallstones in the gallbladder. 3. Peripancreatic edema and mild peripancreatic acute fluid collections, correlate with lipase levels in assessing for acute pancreatitis. These fluid collections are new compared to yesterday's CT scan. 4. Subtle irregularity of the hepatic contour reason the possibility of cirrhosis. 5. Upstream varices along the distal esophagus favoring portal venous hypertension. Dilated inferior mesenteric vein likely with collateralization to the lower IVC. 6. Mild perihepatic ascites. 7. Aortic Atherosclerosis (ICD10-I70.0). Electronically Signed   By: Gaylyn Rong M.D.   On: 02/27/2023 17:00   CT ABDOMEN PELVIS W CONTRAST  Result Date: 02/26/2023 CLINICAL DATA:  Abdominal pain. EXAM: CT ABDOMEN AND PELVIS WITH CONTRAST TECHNIQUE: Multidetector CT imaging of the abdomen and pelvis was performed using the standard protocol following bolus administration of intravenous contrast. RADIATION DOSE REDUCTION:  This exam was performed according to the departmental dose-optimization program which includes automated exposure control, adjustment of the mA and/or kV according to patient size and/or use of iterative reconstruction technique. CONTRAST:  OMNIPAQUE IOHEXOL 300 MG/ML  SOLN COMPARISON:  CT abdomen pelvis dated 06/17/2017. FINDINGS: Lower chest: There are bibasilar  atelectasis. No intra-abdominal free air. Small perihepatic free fluid and small free fluid in the pelvis. Hepatobiliary: There is irregularity of the liver contour suggestive of changes of cirrhosis. There are multiple stones within the gallbladder. The gallbladder is distended. There is a small pericholecystic fluid. Faint curvilinear density within the gallbladder lumen may represent sludge. Gallbladder wall perforation is less likely but not excluded. There is dilatation of the intrahepatic biliary tree and common bile duct. The common bile duct measures approximately 17 mm in diameter. There is a 4 mm stone in the central CBD (37/2). Pancreas: Unremarkable. No pancreatic ductal dilatation or surrounding inflammatory changes. Spleen: Normal in size without focal abnormality. Adrenals/Urinary Tract: The adrenal glands unremarkable. Small left renal inferior pole cyst. There is no hydronephrosis on either side. There is symmetric enhancement and excretion of contrast by both kidneys. The visualized ureters and the urinary bladder appear unremarkable. Stomach/Bowel: There is no bowel obstruction or active inflammation. The appendix is normal. Vascular/Lymphatic: Advanced aortoiliac atherosclerotic disease. The IVC is unremarkable. No portal venous gas. There is no adenopathy. Para soft tissue varices and dilated portosystemic collateral vein. Reproductive: The prostate and seminal vesicles are grossly unremarkable. No pelvic mass. Other: None Musculoskeletal: Osteopenia with degenerative changes of the spine and hips. Mildly displaced fracture of the greater trochanter of the right femur. IMPRESSION: 1. Cholelithiasis and nonobstructing stone in the central CBD with dilatation of the biliary tree and findings concerning for acute cholecystitis. Further evaluation with MRI/MRCP is recommended. 2. Cirrhosis with sequela of portal hypertension. 3. No bowel obstruction. Normal appendix. 4. Mildly displaced fracture of  the greater trochanter of the right femur. 5.  Aortic Atherosclerosis (ICD10-I70.0). Electronically Signed   By: Elgie Collard M.D.   On: 02/26/2023 23:48     The results of significant diagnostics from this hospitalization (including imaging, microbiology, ancillary and laboratory) are listed below for reference.     Microbiology: Recent Results (from the past 240 hour(s))  Culture, blood (Routine X 2) w Reflex to ID Panel     Status: None (Preliminary result)   Collection Time: 02/27/23  7:11 AM   Specimen: BLOOD  Result Value Ref Range Status   Specimen Description   Final    BLOOD BLOOD RIGHT HAND Performed at [facility], [address], [place], [place] [address]    Special Requests   Final    BOTTLES DRAWN AEROBIC AND ANAEROBIC Blood Culture adequate volume Performed at [facility], [address], [place], [place] [address]    Culture   Final    NO GROWTH 3 DAYS Performed at [facility], [address] [place], [place] [address]    Report Status PENDING  Incomplete  Culture, blood (Routine X 2) w Reflex to ID Panel     Status: None (Preliminary result)   Collection Time: 02/27/23  7:13 AM   Specimen: BLOOD  Result Value Ref Range Status   Specimen Description   Final    BLOOD BLOOD LEFT ARM Performed at [facility], [address], [place], [place] [address]    Special Requests   Final    BOTTLES DRAWN AEROBIC  AND ANAEROBIC Blood Culture adequate volume Performed at [facility], [address], [name], [place] [address]    Culture   Final    NO GROWTH 3 DAYS Performed at [facility], [address], [place], [place] [address]    Report Status PENDING  Incomplete     Labs: BNP (last 3 results) No results for input(s): "BNP" in the last 8760 hours. Basic Metabolic Panel: Recent Labs  Lab 02/27/23 0713 02/28/23 0456 03/01/23 0516 03/02/23 0414 03/03/23 0426   NA 140 140 140 140 140  K 3.8 3.4* 3.7 3.5 3.4*  CL 102 103 111 110 115*  CO2 22 23 20* 20* 17*  GLUCOSE 128* 82 93 87 140*  BUN 8 17 47* 50* 40*  CREATININE 0.58* 1.06 1.71* 1.88* 1.44*  CALCIUM 9.0 8.6* 7.1* 7.7* 7.9*  MG 1.9  --  2.5*  --  2.4  PHOS 3.2  --   --   --  <1.0*   Liver Function Tests: Recent Labs  Lab 02/27/23 0713 02/28/23 0456 03/01/23 0516 03/02/23 0414 03/03/23 0426  AST 80* 77* 54* 60* 55*  ALT 40 39 31 34 35  ALKPHOS 160* 155* 137* 154* 157*  BILITOT 7.7* 8.9* 4.0* 2.5* 1.7*  PROT 7.5 7.2 6.3* 6.0* 5.9*  ALBUMIN 3.3* 3.3* 2.6* 2.4* 2.4*   Recent Labs  Lab 02/26/23 2054 02/28/23 1012 03/01/23 0516  LIPASE 25 316* 87*   No results for input(s): "AMMONIA" in the last 168 hours. CBC: Recent Labs  Lab 02/26/23 2054 02/27/23 0713 02/28/23 0456 03/02/23 0414 03/02/23 1346 03/03/23 0426  WBC 5.3 6.3 10.2 6.5  --  5.8  HGB 10.8* 11.1* 11.5* 8.1* 8.7* 8.1*  HCT 31.7* 32.6* 34.0* 23.6* 25.5* 24.7*  MCV 97.8 96.4 97.7 97.1  --  99.2  PLT 192 185 234 153  --  159   Cardiac Enzymes: No results for input(s): "CKTOTAL", "CKMB", "CKMBINDEX", "TROPONINI" in the last 168 hours. BNP: Invalid input(s): "POCBNP" CBG: No results for input(s): "GLUCAP" in the last 168 hours. D-Dimer No results for input(s): "DDIMER" in the last 72 hours. Hgb A1c No results for input(s): "HGBA1C" in the last 72 hours. Lipid Profile No results for input(s): "CHOL", "HDL", "LDLCALC", "TRIG", "CHOLHDL", "LDLDIRECT" in the last 72 hours. Thyroid function studies No results for input(s): "TSH", "T4TOTAL", "T3FREE", "THYROIDAB" in the last 72 hours.  Invalid input(s): "FREET3" Anemia work up Recent Labs    03/01/23 1116  FERRITIN 79  TIBC 228*  IRON 24*   Urinalysis    Component Value Date/Time   COLORURINE AMBER (A) 02/26/2023 2346   APPEARANCEUR CLEAR 02/26/2023 2346   LABSPEC 1.025 02/26/2023 2346   PHURINE 7.0 02/26/2023 2346   GLUCOSEU NEGATIVE 02/26/2023  2346   HGBUR SMALL (A) 02/26/2023 2346   BILIRUBINUR NEGATIVE 02/26/2023 2346   KETONESUR NEGATIVE 02/26/2023 2346   PROTEINUR 30 (A) 02/26/2023 2346   UROBILINOGEN 2.0 (H) 04/26/2013 0945   NITRITE NEGATIVE 02/26/2023 2346   LEUKOCYTESUR NEGATIVE 02/26/2023 2346   Sepsis Labs Recent Labs  Lab 02/27/23 0713 02/28/23 0456 03/02/23 0414 03/03/23 0426  WBC 6.3 10.2 6.5 5.8   Microbiology Recent Results (from the past 240 hour(s))  Culture, blood (Routine X 2) w Reflex to ID Panel     Status: None (Preliminary result)   Collection Time: 02/27/23  7:11 AM   Specimen: BLOOD  Result Value Ref Range Status   Specimen Description   Final  BLOOD BLOOD RIGHT HAND Performed at [facility], [address], [place], [place] [address]    Special Requests   Final    BOTTLES DRAWN AEROBIC AND ANAEROBIC Blood Culture adequate volume Performed at [facility], [address], [place], [place] [address]    Culture   Final    NO GROWTH 3 DAYS Performed at [facility], [address], [place], [place] [address]    Report Status PENDING  Incomplete  Culture, blood (Routine X 2) w Reflex to ID Panel     Status: None (Preliminary result)   Collection Time: 02/27/23  7:13 AM   Specimen: BLOOD  Result Value Ref Range Status   Specimen Description   Final    BLOOD BLOOD LEFT ARM Performed at [facility], [address], [place], [place] [address]    Special Requests   Final    BOTTLES DRAWN AEROBIC AND ANAEROBIC Blood Culture adequate volume Performed at [facility], [address] [place], [place] [address]    Culture   Final    NO GROWTH 3 DAYS Performed at [facility], [address], [place], [place] [address]    Report Status PENDING  Incomplete     Time coordinating discharge:  I have spent 35 minutes face to face with the patient and on the ward discussing the patients care,  assessment, plan and disposition with other care givers. >50% of the time was devoted counseling the patient about the risks and benefits of treatment/Discharge disposition and coordinating care.   SIGNED:   Miguel Rota, MD  [facility] 03/03/2023, 10:53 AM   If 7PM-7AM, please contact night-coverage

## 2023-03-04 ENCOUNTER — Encounter: Payer: Medicare HMO | Admitting: Physical Therapy

## 2023-03-04 ENCOUNTER — Telehealth: Payer: Self-pay

## 2023-03-04 ENCOUNTER — Encounter (HOSPITAL_COMMUNITY): Payer: Self-pay | Admitting: Gastroenterology

## 2023-03-04 LAB — CULTURE, BLOOD (ROUTINE X 2)
Culture: NO GROWTH
Culture: NO GROWTH
Special Requests: ADEQUATE
Special Requests: ADEQUATE

## 2023-03-04 NOTE — Transitions of Care (Post Inpatient/ED Visit) (Signed)
   03/04/2023  Name: Mark [name]. MRN: [id number] DOB: Oct 26, 1955  Today's TOC FU Call Status: Today's TOC FU Call Status:: Unsuccessful Call (2nd Attempt) Unsuccessful Call (2nd Attempt) Date: 03/04/23  Attempted to reach the patient regarding the most recent Inpatient/ED visit.  Follow Up Plan: Additional outreach attempts will be made to reach the patient to complete the Transitions of Care (Post Inpatient/ED visit) call.      Antionette Fairy, RN,BSN,CCM RN Care Manager Transitions of Care  [place]  Direct Phone: [phone] Toll Free: [phone] Fax: [phone]

## 2023-03-04 NOTE — Transitions of Care (Post Inpatient/ED Visit) (Signed)
   03/04/2023  Name: Mark [name]. MRN: [id number] DOB: 1955/06/09  Today's TOC FU Call Status: Today's TOC FU Call Status:: Unsuccessful Call (1st Attempt) Unsuccessful Call (1st Attempt) Date: 03/04/23  Attempted to reach the patient regarding the most recent Inpatient/ED visit.  Follow Up Plan: Additional outreach attempts will be made to reach the patient to complete the Transitions of Care (Post Inpatient/ED visit) call.     Antionette Fairy, RN,BSN,CCM RN Care Manager Transitions of Care  [place]  Direct Phone: [phone] Toll Free: [phone] Fax: [phone]

## 2023-03-05 ENCOUNTER — Telehealth: Payer: Self-pay

## 2023-03-05 NOTE — Transitions of Care (Post Inpatient/ED Visit) (Signed)
   03/05/2023  Name: Mark [name]. MRN: [id number] DOB: August 03, 1955  Today's TOC FU Call Status: Today's TOC FU Call Status:: Unsuccessful Call (3rd Attempt) Unsuccessful Call (3rd Attempt) Date: 03/05/23 (RN CM received notification from admin assistant that pt had returned RN CM call and called office. Return call to pt and no answer.)  Attempted to reach the patient regarding the most recent Inpatient/ED visit.  Follow Up Plan: No further outreach attempts will be made at this time. We have been unable to contact the patient.    Antionette Fairy, RN,BSN,CCM RN Care Manager Transitions of Care  [place]  Direct Phone: [phone] Toll Free: [phone] Fax: [phone]

## 2023-03-05 NOTE — Transitions of Care (Post Inpatient/ED Visit) (Signed)
   03/05/2023  Name: Mark [name]. MRN: [id number] DOB: [birth year]  Today's TOC FU Call Status: Today's TOC FU Call Status:: Unsuccessful Call (3rd Attempt) Unsuccessful Call (3rd Attempt) Date: 03/05/23  Attempted to reach the patient regarding the most recent Inpatient/ED visit.  Follow Up Plan: No further outreach attempts will be made at this time. We have been unable to contact the patient.     Antionette Fairy, RN,BSN,CCM RN Care Manager Transitions of Care  [place]  Direct Phone: [phone] Toll Free: [phone] Fax: [phone]

## 2023-03-09 ENCOUNTER — Encounter: Payer: Medicare HMO | Admitting: Physical Therapy

## 2023-03-11 ENCOUNTER — Ambulatory Visit: Payer: Self-pay

## 2023-03-11 NOTE — Patient Outreach (Signed)
  Care Coordination   03/11/2023 Name: Mark [name]. MRN: [id number] DOB: 07/02/1955   Care Coordination Outreach Attempts:  An unsuccessful telephone outreach was attempted for a scheduled appointment today.  Follow Up Plan:  Additional outreach attempts will be made to offer the patient care coordination information and services.   Encounter Outcome:  No Answer   Care Coordination Interventions:  No, not indicated   Bary Leriche, RN, MSN [facility]  [facility], [facility] Management Community Coordinator Direct Dial: [phone]  Fax: [phone] Website: [email or web address]

## 2023-03-12 ENCOUNTER — Encounter: Payer: Medicare HMO | Admitting: Physical Therapy

## 2023-03-16 ENCOUNTER — Telehealth: Payer: Self-pay | Admitting: Orthopedic Surgery

## 2023-03-16 ENCOUNTER — Ambulatory Visit: Payer: Medicare HMO | Admitting: Orthopedic Surgery

## 2023-03-16 ENCOUNTER — Encounter: Payer: Medicare HMO | Admitting: Physical Therapy

## 2023-03-16 NOTE — Telephone Encounter (Signed)
Patient called. Would like to come back for PT. Would need a new referral for him put in Epic to see Zella Ball again.

## 2023-03-17 ENCOUNTER — Telehealth: Payer: Self-pay

## 2023-03-17 ENCOUNTER — Other Ambulatory Visit: Payer: Self-pay

## 2023-03-17 DIAGNOSIS — Z89611 Acquired absence of right leg above knee: Secondary | ICD-10-CM

## 2023-03-17 NOTE — Patient Outreach (Signed)
  Care Coordination   03/17/2023 Name: Mark [name]. MRN: [id number] DOB: Dec 27, 1955   Care Coordination Outreach Attempts:  A second unsuccessful outreach was attempted today to offer the patient with information about available care coordination services.  Follow Up Plan:  Additional outreach attempts will be made to offer the patient care coordination information and services.   Encounter Outcome:  No Answer   Care Coordination Interventions:  No, not indicated    Bary Leriche, RN, MSN [facility]  [facility] Management Community Coordinator Direct Dial: [phone]  Fax: [phone] Website: [email or web address]

## 2023-03-17 NOTE — Telephone Encounter (Signed)
Pt is s/p a right AKA order in chart for physical therapy.

## 2023-03-18 ENCOUNTER — Encounter: Payer: Medicare HMO | Admitting: Physical Therapy

## 2023-03-25 ENCOUNTER — Telehealth: Payer: Self-pay

## 2023-03-25 NOTE — Patient Outreach (Addendum)
  Care Coordination   03/25/2023 Name: Mark [name]. MRN: [id number] DOB: 09-26-1955   Care Coordination Outreach Attempts:  A third unsuccessful outreach was attempted today to offer the patient with information about available care coordination services.  Follow Up Plan:  No further outreach attempts will be made at this time. We have been unable to contact the patient to offer or enroll patient in care coordination services  Encounter Outcome:  No Answer   Care Coordination Interventions:  No, not indicated    Porfirio Bollier Idelle Jo, RN, MSN RN Care Manager [facility], Population Health Direct Dial: [phone]  Fax: [phone] Website: [email or web address]

## 2023-03-30 ENCOUNTER — Encounter: Payer: Medicare HMO | Admitting: Physical Therapy

## 2023-03-30 ENCOUNTER — Other Ambulatory Visit: Payer: Self-pay

## 2023-03-30 ENCOUNTER — Ambulatory Visit (HOSPITAL_COMMUNITY)
Admission: EM | Admit: 2023-03-30 | Discharge: 2023-03-30 | Disposition: A | Discharge status: Home / Self Care | Payer: Medicare HMO | Attending: Family Medicine | Admitting: Family Medicine

## 2023-03-30 ENCOUNTER — Encounter (HOSPITAL_COMMUNITY): Payer: Self-pay | Admitting: *Deleted

## 2023-03-30 DIAGNOSIS — L309 Dermatitis, unspecified: Secondary | ICD-10-CM

## 2023-03-30 DIAGNOSIS — R2242 Localized swelling, mass and lump, left lower limb: Secondary | ICD-10-CM

## 2023-03-30 MED ORDER — TRIAMCINOLONE ACETONIDE 0.025 % EX OINT
1.0000 | TOPICAL_OINTMENT | Freq: Two times a day (BID) | CUTANEOUS | 0 refills | Status: AC | PRN
Start: 2023-03-30 — End: ?

## 2023-03-30 MED ORDER — FAMOTIDINE 20 MG PO TABS: 20.0000 mg | ORAL_TABLET | Freq: Two times a day (BID) | ORAL | 0 refills | Status: AC

## 2023-03-30 MED ORDER — DEXAMETHASONE SODIUM PHOSPHATE 10 MG/ML IJ SOLN
10.0000 mg | Freq: Once | INTRAMUSCULAR | Status: AC
  Administered 2023-03-30: 10 mg via INTRAMUSCULAR

## 2023-03-30 MED ORDER — DEXAMETHASONE SODIUM PHOSPHATE 10 MG/ML IJ SOLN
INTRAMUSCULAR | Status: AC
  Filled 2023-03-30: qty 1

## 2023-03-30 NOTE — ED Provider Notes (Signed)
[facility]    CSN: [id number] Arrival date & time: 03/30/23  1146      History   Chief Complaint Chief Complaint  Patient presents with   Rash   Leg Pain    HPI Mark [name]. is a 67 y.o. male.  Patient presents today with a generalized body rash which she reports developed over a week ago after he was burning wood.  He reports he has not had any other changes to eating habits or taking any new medications.  He endorses that the rash is pruritic.  He has not tried any medications over-the-counter.  He reports that the rash has spread to his face and was initially localized to his upper and lower extremities.  Patient also complains of left lower leg swelling.  Patient has a BKA involving the right extremity and uses a crutch for ambulation.  He denies any injury involving the left lower leg but reports over the last 2 days the leg swelling has worsened.  He denies that he elevates his leg when at rest.  On chart review patient is prescribed Lasix 20 mg twice daily for fluid retention, however patient was unaware of the indication for this medications.  Past Medical History:  Diagnosis Date   Alcohol abuse    Anemia    Arthritis    Ascites    Cirrhosis ([name])    Coffee ground emesis    Dehydration 06/17/2017   Febrile illness    GERD (gastroesophageal reflux disease)    Heart murmur    History of blood transfusion    Hyperlipidemia    Hypertension    Leg swelling    Myocardial infarction ([name]) 2012   Preop cardiovascular exam 04/14/2013   Sepsis ([name]) 06/17/2017   Septic shock ([name]) 06/18/2017   SIRS (systemic inflammatory response syndrome) ([name]) 07/11/2017   Stroke ([name]) 10/2009   TIA   Thrombocytopenia ([name])     Patient Active Problem List   Diagnosis Date Noted   Acute blood loss anemia 03/02/2023   Choledocholithiasis 02/28/2023   HTN (hypertension) 02/28/2023   Hypokalemia 02/28/2023   Cholelithiasis with biliary obstruction 02/27/2023    Infection of total knee replacement ([name]) 11/07/2022   Hx of AKA (above knee amputation), right ([name]) 11/07/2022   Cerebrovascular disease 07/08/2022   Renal cyst 07/08/2022   Protein-calorie malnutrition, severe 07/08/2022   Toxic metabolic encephalopathy 02/26/2022   Septic joint of right knee joint ([name]) 11/08/2020   S/P right TKA reimplantation 08/02/2020   Infection of total left knee replacement ([name]) 03/20/2020   Cirrhosis of liver without ascites ([name])    AKI (acute kidney injury) ([name])    Bacteremia    Spinal stenosis of lumbar region without neurogenic claudication    Aspiration pneumonia ([name]) 09/24/2018   Abscess in epidural space of lumbar spine    MRSA bacteremia 09/21/2018   Infection of prosthetic right knee joint ([name]) 09/20/2018   Anemia of chronic disease 09/20/2018   Hypoalbuminemia 09/20/2018   Hypoglycemia without diagnosis of diabetes mellitus 09/20/2018   Epidural abscess 09/20/2018   Hyperkalemia 05/18/2018   Edentulous 05/11/2018   Pancytopenia ([name]) 05/10/2018   CAD (coronary artery disease) 05/10/2018   Hepatic encephalopathy ([name]) 05/10/2018   Alcohol abuse 05/10/2018   GERD (gastroesophageal reflux disease) 05/10/2018   Duodenal ulcer    Renal failure    Acute on chronic anemia    Hypotension 06/17/2017   Hyponatremia 06/17/2017   Leg edema, right 06/17/2017   Acute  metabolic encephalopathy 06/17/2017   Anemia, iron deficiency    Benign neoplasm of ascending colon    Hemorrhoids    Portal hypertensive gastropathy ([name])    Gastritis and gastroduodenitis    Esophageal varices without bleeding ([name])    H/O: CVA (cerebrovascular accident) 12/01/2013   ACS (acute coronary syndrome) ([name]) 12/01/2013   Anasarca 12/01/2013   Polysubstance abuse ([name]) 12/01/2013   CKD (chronic kidney disease) stage 3, GFR 30-59 ml/min ([name]) 12/01/2013   S/P right TKA 05/02/2013   Murmur 04/14/2013   Right inguinal hernia 12/26/2010   Cirrhosis, alcoholic ([name])  12/20/2010    Past Surgical History:  Procedure Laterality Date   AMPUTATION Right 11/07/2022   Procedure: RIGHT ABOVE KNEE AMPUTATION;  Surgeon: Nadara Mustard, MD;  Location: [facility] OR;  Service: Orthopedics;  Laterality: Right;   COLONOSCOPY WITH PROPOFOL N/A 02/07/2016   Procedure: COLONOSCOPY WITH PROPOFOL;  Surgeon: Rachael Fee, MD;  Location: [facility];  Service: Endoscopy;  Laterality: N/A;   ERCP N/A 02/28/2023   Procedure: ENDOSCOPIC RETROGRADE CHOLANGIOPANCREATOGRAPHY (ERCP);  Surgeon: Jeani Hawking, MD;  Location: [facility];  Service: Gastroenterology;  Laterality: N/A;   ESOPHAGOGASTRODUODENOSCOPY (EGD) WITH PROPOFOL N/A 02/07/2016   Procedure: ESOPHAGOGASTRODUODENOSCOPY (EGD) WITH PROPOFOL;  Surgeon: Rachael Fee, MD;  Location: [facility];  Service: Endoscopy;  Laterality: N/A;   ESOPHAGOGASTRODUODENOSCOPY (EGD) WITH PROPOFOL N/A 06/22/2017   Procedure: ESOPHAGOGASTRODUODENOSCOPY (EGD) WITH PROPOFOL;  Surgeon: Beverley Fiedler, MD;  Location: [facility] ENDOSCOPY;  Service: Gastroenterology;  Laterality: N/A;   EXCISIONAL TOTAL KNEE ARTHROPLASTY WITH ANTIBIOTIC SPACERS Right 03/20/2020   Procedure: Resection right total knee arthroplasty and placement of antibiotic spacer;  Surgeon: Durene Romans, MD;  Location: [facility];  Service: Orthopedics;  Laterality: Right;  90 mins   HERNIA REPAIR Right    inguinal   INGUINAL HERNIA REPAIR Left 02/08/2021   Procedure: OPEN LEFT INGUINAL HERNIA REPAIR WITH MESH;  Surgeon: Abigail Miyamoto, MD;  Location: [facility];  Service: General;  Laterality: Left;   IRRIGATION AND DEBRIDEMENT KNEE Right 11/13/2020   Procedure: IRRIGATION AND DEBRIDEMENT KNEE;  Surgeon: Durene Romans, MD;  Location: [facility];  Service: Orthopedics;  Laterality: Right;   JOINT REPLACEMENT     KNEE ARTHROSCOPY     bilateral/  12/14   REIMPLANTATION OF TOTAL KNEE Right 08/02/2020   Procedure: REIMPLANTATION/REVISION OF TOTAL KNEE WITH REMOVAL OF ANTIBIOTIC SPACER;  Surgeon:  Durene Romans, MD;  Location: [facility];  Service: Orthopedics;  Laterality: Right;    REMOVAL OF STONES  02/28/2023   Procedure: REMOVAL OF DEBRIS;  Surgeon: Jeani Hawking, MD;  Location: [facility];  Service: Gastroenterology;;   Dennison Mascot  02/28/2023   Procedure: Dennison Mascot;  Surgeon: Jeani Hawking, MD;  Location: [facility];  Service: Gastroenterology;;   TEE WITHOUT CARDIOVERSION N/A 05/13/2018   Procedure: TRANSESOPHAGEAL ECHOCARDIOGRAM (TEE);  Surgeon: Parke Poisson, MD;  Location: [facility] ENDOSCOPY;  Service: Cardiovascular;  Laterality: N/A;   TOTAL KNEE ARTHROPLASTY Right 05/02/2013   Procedure: RIGHT TOTAL KNEE ARTHROPLASTY;  Surgeon: Shelda Pal, MD;  Location: [facility];  Service: Orthopedics;  Laterality: Right;       Home Medications    Prior to Admission medications   Medication Sig Start Date End Date Taking? Authorizing Provider  amLODipine (NORVASC) 10 MG tablet Take 1 tablet (10 mg total) by mouth daily. 03/03/23  Yes Amin, Ankit C, MD  famotidine (PEPCID) 20 MG tablet Take 1 tablet (20 mg total) by mouth 2 (two) times daily for 10  days. 03/30/23 04/09/23 Yes Bing Neighbors, NP  ferrous sulfate 325 (65 FE) MG tablet Take 1 tablet (325 mg total) by mouth 2 (two) times daily with a meal. Take for two weeks as tolerated. Patient taking differently: Take 325 mg by mouth every other day. 08/15/20  Yes Deeann Saint, MD  folic acid (FOLVITE) 1 MG tablet Take 1 tablet (1 mg total) by mouth daily. 03/03/23  Yes Amin, Ankit C, MD  Multiple Vitamin (MULTIVITAMIN WITH MINERALS) TABS tablet Take 1 tablet by mouth daily. 03/03/23  Yes Amin, Ankit C, MD  triamcinolone (KENALOG) 0.025 % ointment Apply 1 Application topically 2 (two) times daily as needed (rash and itching). 03/30/23  Yes Bing Neighbors, NP  furosemide (LASIX) 20 MG tablet TAKE 1 TABLET(20 MG) BY MOUTH TWICE DAILY Patient taking differently: Take 20 mg by mouth 2 (two) times daily. 02/09/23   Deeann Saint, MD  HYDROcodone-acetaminophen (NORCO/VICODIN) 5-325 MG tablet Take 1 tablet by mouth every 6 (six) hours as needed for moderate pain (pain score 4-6) or severe pain (pain score 7-10). 03/03/23   Miguel Rota, MD  naloxone [facility]) nasal spray 4 mg/0.1 mL Use as directed in case of overdose 02/18/23   Claudette Stapler C, PA-C  ondansetron (ZOFRAN-ODT) 4 MG disintegrating tablet Take 1 tablet (4 mg total) by mouth every 8 (eight) hours as needed for nausea or vomiting. 03/03/23   Amin, Ankit C, MD  senna-docusate (SENOKOT-S) 8.6-50 MG tablet Take 2 tablets by mouth at bedtime as needed for moderate constipation or mild constipation. 03/03/23   Amin, Ankit C, MD  thiamine (VITAMIN B-1) 100 MG tablet Take 1 tablet (100 mg total) by mouth daily. 03/03/23   Miguel Rota, MD    Family History Family History  Problem Relation Age of Onset   Hypertension Father    Diabetes Father    Dementia Mother    Lupus Sister     Social History Social History   Tobacco Use   Smoking status: Former    Current packs/day: 0.00    Average packs/day: 0.5 packs/day for 10.0 years (5.0 ttl pk-yrs)    Types: Cigarettes    Start date: 01/04/2006    Quit date: 01/05/2016    Years since quitting: 7.2   Smokeless tobacco: Never   Tobacco comments:    Pt reports he quit smoke 3 months ago. 07/11/22-km,cma  Vaping Use   Vaping status: Never Used  Substance Use Topics   Alcohol use: Yes    Comment: 40 ounces a day   Drug use: Yes    Types: Marijuana    Comment: occasionally     Allergies   Patient has no known allergies.   Review of Systems Review of Systems  Skin:  Positive for rash.     Physical Exam Triage Vital Signs ED Triage Vitals  Encounter Vitals Group     BP 03/30/23 1220 132/70     Systolic BP Percentile --      Diastolic BP Percentile --      Pulse Rate 03/30/23 1220 82     Resp 03/30/23 1220 20     Temp 03/30/23 1220 98.4 F (36.9 C)     Temp src --      SpO2 03/30/23  1220 98 %     Weight --      Height --      Head Circumference --      Peak Flow --  Pain Score 03/30/23 1216 0     Pain Loc --      Pain Education --      Exclude from Growth Chart --    No data found.  Updated Vital Signs BP 132/70   Pulse 82   Temp 98.4 F (36.9 C)   Resp 20   SpO2 98%   Visual Acuity Right Eye Distance:   Left Eye Distance:   Bilateral Distance:    Right Eye Near:   Left Eye Near:    Bilateral Near:     Physical Exam Constitutional:      Appearance: Normal appearance.     Comments: Chronically ill-appearing.   HENT:     Head: Normocephalic and atraumatic.  Eyes:     Extraocular Movements: Extraocular movements intact.     Pupils: Pupils are equal, round, and reactive to light.  Cardiovascular:     Rate and Rhythm: Normal rate and regular rhythm.  Pulmonary:     Effort: Pulmonary effort is normal.  Musculoskeletal:     Cervical back: Normal range of motion and neck supple.     Left lower leg: Edema present.  Skin:    General: Skin is dry.     Findings: Rash present. Rash is macular and scaling.  Neurological:     General: No focal deficit present.     UC Treatments / Results  Labs (all labs ordered are listed, but only abnormal results are displayed) Labs Reviewed - No data to display  EKG   Radiology No results found.  Procedures Procedures (including critical care time)  Medications Ordered in UC Medications  dexamethasone (DECADRON) injection 10 mg (has no administration in time range)    Initial Impression / Assessment and Plan / UC Course  I have reviewed the triage vital signs and the nursing notes.  Pertinent labs & imaging results that were available during my care of the patient were reviewed by me and considered in my medical decision making (see chart for details).    Chronic lower leg edema, encourage patient to take Lasix 20 mg twice daily as prescribed and to elevate extremity when at rest.  Treating  for dermatitis rash of unknown etiology with Decadron 10 mg given here in clinic.  Patient will continue home management with famotidine 20 mg twice daily and triamcinolone ointment.  Patient encouraged to follow-up with primary care doctor if symptoms worsen or do not improve. Final Clinical Impressions(s) / UC Diagnoses   Final diagnoses:  Dermatitis  Localized swelling of left lower leg     Discharge Instructions      You are prescribed Furosamide 20 mg( Lasix) twice daily this is for fluid retention and swelling and that should help your leg swelling resolved.  As discussed elevate your left leg above your heart this will help reduce swelling. For the rash I am prescribing you famotidine 20 mg twice daily for 10 days and triamcinolone cream to be applied 3 times daily until rash resolved.     ED Prescriptions     Medication Sig Dispense Auth. Provider   famotidine (PEPCID) 20 MG tablet Take 1 tablet (20 mg total) by mouth 2 (two) times daily for 10 days. 20 tablet Bing Neighbors, NP   triamcinolone (KENALOG) 0.025 % ointment Apply 1 Application topically 2 (two) times daily as needed (rash and itching). 454 g Bing Neighbors, NP      PDMP not reviewed this encounter.   Bing Neighbors, NP  03/30/23 1524  

## 2023-03-30 NOTE — Discharge Instructions (Addendum)
You are prescribed Furosamide 20 mg( Lasix) twice daily this is for fluid retention and swelling and that should help your leg swelling resolved.  As discussed elevate your left leg above your heart this will help reduce swelling. For the rash I am prescribing you famotidine 20 mg twice daily for 10 days and triamcinolone cream to be applied 3 times daily until rash resolved.

## 2023-03-30 NOTE — ED Triage Notes (Signed)
Pt reports he is broke out all over and skin itches. Pt reports rash for one week.

## 2023-03-30 NOTE — Therapy (Incomplete)
OUTPATIENT PHYSICAL THERAPY PROSTHETIC EVALUATION   Patient Name: Mark [name]. MRN: [id number] DOB:10/31/1955, 67 y.o., male Today's Date: 03/30/2023  PCP: Abbe Amsterdam, MD REFERRING PROVIDER: Aldean Baker, MD  END OF SESSION:    Past Medical History:  Diagnosis Date   Alcohol abuse    Anemia    Arthritis    Ascites    Cirrhosis ([name])    Coffee ground emesis    Dehydration 06/17/2017   Febrile illness    GERD (gastroesophageal reflux disease)    Heart murmur    History of blood transfusion    Hyperlipidemia    Hypertension    Leg swelling    Myocardial infarction ([name]) 2012   Preop cardiovascular exam 04/14/2013   Sepsis ([name]) 06/17/2017   Septic shock ([name]) 06/18/2017   SIRS (systemic inflammatory response syndrome) ([name]) 07/11/2017   Stroke ([name]) 10/2009   TIA   Thrombocytopenia ([name])    Past Surgical History:  Procedure Laterality Date   AMPUTATION Right 11/07/2022   Procedure: RIGHT ABOVE KNEE AMPUTATION;  Surgeon: Nadara Mustard, MD;  Location: [facility] OR;  Service: Orthopedics;  Laterality: Right;   COLONOSCOPY WITH PROPOFOL N/A 02/07/2016   Procedure: COLONOSCOPY WITH PROPOFOL;  Surgeon: Rachael Fee, MD;  Location: [facility] ENDOSCOPY;  Service: Endoscopy;  Laterality: N/A;   ERCP N/A 02/28/2023   Procedure: ENDOSCOPIC RETROGRADE CHOLANGIOPANCREATOGRAPHY (ERCP);  Surgeon: Jeani Hawking, MD;  Location: [facility];  Service: Gastroenterology;  Laterality: N/A;   ESOPHAGOGASTRODUODENOSCOPY (EGD) WITH PROPOFOL N/A 02/07/2016   Procedure: ESOPHAGOGASTRODUODENOSCOPY (EGD) WITH PROPOFOL;  Surgeon: Rachael Fee, MD;  Location: [facility];  Service: Endoscopy;  Laterality: N/A;   ESOPHAGOGASTRODUODENOSCOPY (EGD) WITH PROPOFOL N/A 06/22/2017   Procedure: ESOPHAGOGASTRODUODENOSCOPY (EGD) WITH PROPOFOL;  Surgeon: Beverley Fiedler, MD;  Location: [facility] ENDOSCOPY;  Service: Gastroenterology;  Laterality: N/A;   EXCISIONAL TOTAL KNEE ARTHROPLASTY WITH ANTIBIOTIC SPACERS  Right 03/20/2020   Procedure: Resection right total knee arthroplasty and placement of antibiotic spacer;  Surgeon: Durene Romans, MD;  Location: [facility] ORS;  Service: Orthopedics;  Laterality: Right;  90 mins   HERNIA REPAIR Right    inguinal   INGUINAL HERNIA REPAIR Left 02/08/2021   Procedure: OPEN LEFT INGUINAL HERNIA REPAIR WITH MESH;  Surgeon: Abigail Miyamoto, MD;  Location: [facility] OR;  Service: General;  Laterality: Left;   IRRIGATION AND DEBRIDEMENT KNEE Right 11/13/2020   Procedure: IRRIGATION AND DEBRIDEMENT KNEE;  Surgeon: Durene Romans, MD;  Location: [facility] ORS;  Service: Orthopedics;  Laterality: Right;   JOINT REPLACEMENT     KNEE ARTHROSCOPY     bilateral/  12/14   REIMPLANTATION OF TOTAL KNEE Right 08/02/2020   Procedure: REIMPLANTATION/REVISION OF TOTAL KNEE WITH REMOVAL OF ANTIBIOTIC SPACER;  Surgeon: Durene Romans, MD;  Location: [facility];  Service: Orthopedics;  Laterality: Right;    REMOVAL OF STONES  02/28/2023   Procedure: REMOVAL OF DEBRIS;  Surgeon: Jeani Hawking, MD;  Location: [facility];  Service: Gastroenterology;;   Dennison Mascot  02/28/2023   Procedure: Dennison Mascot;  Surgeon: Jeani Hawking, MD;  Location: [facility];  Service: Gastroenterology;;   TEE WITHOUT CARDIOVERSION N/A 05/13/2018   Procedure: TRANSESOPHAGEAL ECHOCARDIOGRAM (TEE);  Surgeon: Parke Poisson, MD;  Location: [facility] ENDOSCOPY;  Service: Cardiovascular;  Laterality: N/A;   TOTAL KNEE ARTHROPLASTY Right 05/02/2013   Procedure: RIGHT TOTAL KNEE ARTHROPLASTY;  Surgeon: Shelda Pal, MD;  Location: [facility] OR[facility];  Service: Orthopedics;  Laterality: Right;   Patient Active Problem List   Diagnosis Date Noted  Acute blood loss anemia 03/02/2023   Choledocholithiasis 02/28/2023   HTN (hypertension) 02/28/2023   Hypokalemia 02/28/2023   Cholelithiasis with biliary obstruction 02/27/2023   Infection of total knee replacement ([name]) 11/07/2022   Hx of AKA (above knee amputation), right ([name])  11/07/2022   Cerebrovascular disease 07/08/2022   Renal cyst 07/08/2022   Protein-calorie malnutrition, severe 07/08/2022   Toxic metabolic encephalopathy 02/26/2022   Septic joint of right knee joint ([name]) 11/08/2020   S/P right TKA reimplantation 08/02/2020   Infection of total left knee replacement ([name]) 03/20/2020   Cirrhosis of liver without ascites ([name])    AKI (acute kidney injury) ([name])    Bacteremia    Spinal stenosis of lumbar region without neurogenic claudication    Aspiration pneumonia ([name]) 09/24/2018   Abscess in epidural space of lumbar spine    MRSA bacteremia 09/21/2018   Infection of prosthetic right knee joint ([name]) 09/20/2018   Anemia of chronic disease 09/20/2018   Hypoalbuminemia 09/20/2018   Hypoglycemia without diagnosis of diabetes mellitus 09/20/2018   Epidural abscess 09/20/2018   Hyperkalemia 05/18/2018   Edentulous 05/11/2018   Pancytopenia ([name]) 05/10/2018   CAD (coronary artery disease) 05/10/2018   Hepatic encephalopathy ([name]) 05/10/2018   Alcohol abuse 05/10/2018   GERD (gastroesophageal reflux disease) 05/10/2018   Duodenal ulcer    Renal failure    Acute on chronic anemia    Hypotension 06/17/2017   Hyponatremia 06/17/2017   Leg edema, right 06/17/2017   Acute metabolic encephalopathy 06/17/2017   Anemia, iron deficiency    Benign neoplasm of ascending colon    Hemorrhoids    Portal hypertensive gastropathy ([name])    Gastritis and gastroduodenitis    Esophageal varices without bleeding ([name])    H/O: CVA (cerebrovascular accident) 12/01/2013   ACS (acute coronary syndrome) ([name]) 12/01/2013   Anasarca 12/01/2013   Polysubstance abuse ([name]) 12/01/2013   CKD (chronic kidney disease) stage 3, GFR 30-59 ml/min ([name]) 12/01/2013   S/P right TKA 05/02/2013   Murmur 04/14/2013   Right inguinal hernia 12/26/2010   Cirrhosis, alcoholic ([name]) 12/20/2010    ONSET DATE: 02/10/2023 prosthesis delivery  REFERRING DIAG: Z89.611 (ICD-10-CM) - Hx of  AKA (above knee amputation), right   THERAPY DIAG:  No diagnosis found.  Rationale for Evaluation and Treatment: Rehabilitation  SUBJECTIVE:   SUBJECTIVE STATEMENT: ***  Patient underwent a right Transfemoral Amputation on 11/07/2022 due to infected TKA. Initial right TKA 2015 with antibiotic spacers 2021 & 2022 with I&D. He got prosthesis on 02/10/2023.  He was under PT care for 2 visits and was discharged due to medical change. He was hospitalized 02/26/2023 - 03/03/2023 with Choledocholithiasis and underwent sphincterotomy surgery 02/28/2023 for stone removal.   PERTINENT HISTORY: right TFA 11/07/22, inguinal hernia repair BLEs, ETOH abuse, cirrhosis, arthritis, GERD, HTN, HLD, heart murmur, MI, TIA 2011, systemic inflammatory response syndrome  PAIN:  Are you having pain? Yes: NPRS scale: today ***  6/10 and in last week 4/10 to 8/10 Pain location: right residual limb more distal lateral Pain description: sharp Aggravating factors: unknown Relieving factors:  pain pill, sit to rest  PRECAUTIONS: Fall  WEIGHT BEARING RESTRICTIONS: No  FALLS: Has patient fallen in last 6 months? Yes. Number of falls ~1x/wk before & since amputation.    LIVING ENVIRONMENT: Lives with: lives with their spouse and dog 60#  Lives in: House Home Access: Stairs to enter Home layout: One level Stairs: Yes: External: 2 steps to porch without rails and threshold into house Has  following equipment at home: Single point cane, Walker - 2 wheeled, Crutches, Wheelchair (manual), and Tour manager  OCCUPATION:   on disability. 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   OBJECTIVE:  COGNITION: Overall cognitive status: Within functional limits for tasks  assessed  POSTURE: rounded shoulders, forward head, flexed trunk , and weight shift left  LOWER EXTREMITY ROM:  ROM P:passive  A:active Right eval Left eval  Hip flexion    Hip extension Thomas -9*   Hip abduction    Hip adduction    Hip internal rotation    Hip external rotation    Knee flexion    Knee extension    Ankle dorsiflexion    Ankle plantarflexion    Ankle inversion    Ankle eversion     (Blank rows = not tested)  LOWER EXTREMITY MMT:  MMT Right eval Left eval  Hip flexion 4/5 5/5  Hip extension 3/5 4/5  Hip abduction 3+/5 4/5  Hip adduction    Hip internal rotation    Hip external rotation    Knee flexion  5/5  Knee extension  5/5  Ankle dorsiflexion  5/5  Ankle plantarflexion    Ankle inversion    Ankle eversion    At Evaluation all strength testing is grossly seated and functionally standing / gait. (Blank rows = not tested)  BED MOBILITY:  Modified independent supine <> sit  TRANSFERS: 03/30/2023: Sit to stand: SBA requires use of armrests from stable chair and external support for stabilization Stand to sit: SBA requires use or armrests on stable chair and sturdy chair.   FUNCTIONAL TESTs:  03/30/2023:    GAIT: 03/30/2023: Gait pattern: step to pattern, decreased step length- Left, decreased stance time- Right, decreased hip/knee flexion- Right, circumduction- Right, Right hip hike, antalgic, trendelenburg, lateral hip instability, trunk flexed, abducted- Right, and poor foot clearance- Right Distance walked: 100' Assistive device utilized: Crutches and TFA prosthesis Level of assistance: SBA with crutches Comments: pt amb 3' with HHA maxA   CURRENT PROSTHETIC WEAR ASSESSMENT: 03/30/2023 / Evaluation:  Patient is dependent with: skin check, residual limb care, care of non-amputated limb, prosthetic cleaning, ply sock cleaning, correct ply sock adjustment, proper wear schedule/adjustment, and proper weight-bearing  schedule/adjustment Donning prosthesis: SBA Doffing prosthesis: SBA Prosthetic wear tolerance: 2-8 hours, 1x/day5 of 6 days since delivery Prosthetic weight bearing tolerance: 5 minutes with limb pain 7/10 Edema: pitting edema Residual limb condition: no open areas, normal color, temperature & moisture, scar is mobile with slight invagination. Prosthetic description: silicon liner with velcro landyard, ischial containment socket with flexible inner liner, multi-axial hydraulic knee, dynamic response foot K code/activity level with prosthetic use: Level 3    TODAY'S TREATMENT:  DATE:  03/30/2023: ***   PATIENT EDUCATION: PATIENT EDUCATED ON FOLLOWING PROSTHETIC CARE: Education details:   Skin check, Prosthetic cleaning, Propper donning, and Proper wear schedule/adjustment  Prosthetic wear tolerance: 3 hours 2x/day, 7 days/week Person educated: Patient Education method: Explanation, Demonstration, Tactile cues, and Verbal cues Education comprehension: verbalized understanding, verbal cues required, tactile cues required, and needs further education  HOME EXERCISE PROGRAM:  ASSESSMENT:  CLINICAL IMPRESSION: ***  Patient is a 67 y.o. male who was seen today for physical therapy evaluation and treatment for prosthetic training with right Transfemoral Amputation.  Patient is dependent in prosthetic care and has limited wear of the prosthesis.  Patient has residual limb pain limiting function.  He has impaired right hip extension range and decreased strength bilateral lower extremities.  Berg balance score indicates high risk of falls and dependency and standing ADLs.  Patient has dependent prosthetic gait requiring bilateral upper extremity support of crutches and gait deviations indicating high risk of falls.  Patient would benefit from skilled PT to improve function and  safety with his prosthesis.  OBJECTIVE IMPAIRMENTS: Abnormal gait, decreased activity tolerance, decreased balance, decreased endurance, decreased knowledge of condition, decreased knowledge of use of DME, decreased mobility, difficulty walking, decreased ROM, decreased strength, decreased safety awareness, postural dysfunction, prosthetic dependency , and pain.   ACTIVITY LIMITATIONS: carrying, lifting, standing, stairs, transfers, and locomotion level  PARTICIPATION LIMITATIONS: meal prep, cleaning, driving, community activity, occupation, and yard work  PERSONAL FACTORS: Education, Fitness, Past/current experiences, Time since onset of injury/illness/exacerbation, and 3+ comorbidities: see PMH  are also affecting patient's functional outcome.   REHAB POTENTIAL: Good  CLINICAL DECISION MAKING: Evolving/moderate complexity  EVALUATION COMPLEXITY: Moderate   GOALS: Goals reviewed with patient? Yes  SHORT TERM GOALS: Target date: ***  Patient donnes prosthesis modified independent & verbalizes proper cleaning. Baseline: SEE OBJECTIVE DATA Goal status: INITIAL 2.  Patient tolerates prosthesis >10 hrs total /day without skin issues or limb pain >5/10 after standing. Baseline: SEE OBJECTIVE DATA Goal status: INITIAL  3.  Patient able to reach 7" and look over both shoulders without UE support with supervision. Baseline: SEE OBJECTIVE DATA Goal status: INITIAL  4. Patient ambulates 200' with crutches & prosthesis with supervision / verbal cues only. Baseline: SEE OBJECTIVE DATA Goal status: INITIAL  5. Patient negotiates ramps & curbs with crutches & prosthesis with supervision. Baseline: SEE OBJECTIVE DATA Goal status: INITIAL  LONG TERM GOALS: Target date: ***  Patient demonstrates & verbalized understanding of prosthetic care to enable safe utilization of prosthesis. Baseline: SEE OBJECTIVE DATA Goal status: INITIAL  Patient tolerates prosthesis wear >90% of awake hours  without skin or limb pain issues. Baseline: SEE OBJECTIVE DATA Goal status: INITIAL  Berg Balance >36/56 to indicate lower fall risk Baseline: SEE OBJECTIVE DATA Goal status: INITIAL  Patient ambulates >500' with prosthesis and cane or less independently Baseline: SEE OBJECTIVE DATA Goal status: INITIAL  Patient negotiates ramps, curbs & stairs with single rail with prosthesis and cane independently. Baseline: SEE OBJECTIVE DATA Goal status: INITIAL  Patient demonstrates & verbalizes lifting, carrying, pushing, pulling with prosthesis safely. Baseline: SEE OBJECTIVE DATA Goal status: INITIAL  PLAN:  PT FREQUENCY: 2x/week  PT DURATION: 13 weeks / 90 days  PLANNED INTERVENTIONS: [id number]- PT Re-evaluation, 97110-Therapeutic exercises, 97530- Therapeutic activity, 97112- Neuromuscular re-education, 97535- Self Care, [id number]- Manual therapy, [phone]- Gait training, [phone]- Prosthetic training, Patient/Family education, Balance training, Stair training, Vestibular training, DME instructions, Therapeutic exercises, Therapeutic activity, Neuromuscular re-education, Gait training, and Self Care  PLAN FOR NEXT SESSION: ***  prosthetic care education, HEP for pre-gait and balance   Vladimir Faster, PT, DPT 03/30/2023, 7:44 AM

## 2023-03-30 NOTE — ED Triage Notes (Signed)
Pt also reports Lt lower leg pain with swelling.

## 2023-04-03 ENCOUNTER — Other Ambulatory Visit: Payer: Self-pay | Admitting: Family Medicine

## 2023-04-13 ENCOUNTER — Other Ambulatory Visit: Payer: Self-pay

## 2023-04-13 ENCOUNTER — Encounter: Payer: Self-pay | Admitting: Physical Therapy

## 2023-04-13 ENCOUNTER — Ambulatory Visit (INDEPENDENT_AMBULATORY_CARE_PROVIDER_SITE_OTHER): Payer: Medicare HMO | Admitting: Physical Therapy

## 2023-04-13 DIAGNOSIS — R2681 Unsteadiness on feet: Secondary | ICD-10-CM | POA: Diagnosis not present

## 2023-04-13 DIAGNOSIS — R2689 Other abnormalities of gait and mobility: Secondary | ICD-10-CM

## 2023-04-13 DIAGNOSIS — M6281 Muscle weakness (generalized): Secondary | ICD-10-CM | POA: Diagnosis not present

## 2023-04-13 DIAGNOSIS — R296 Repeated falls: Secondary | ICD-10-CM

## 2023-04-13 DIAGNOSIS — M25651 Stiffness of right hip, not elsewhere classified: Secondary | ICD-10-CM

## 2023-04-13 NOTE — Therapy (Signed)
OUTPATIENT PHYSICAL THERAPY PROSTHETIC EVALUATION   Patient Name: Mark [name]. MRN: [id number] DOB:1955-05-06, 67 y.o., male Today's Date: 04/13/2023  PCP: Abbe Amsterdam, MD REFERRING PROVIDER: Aldean Baker, MD  END OF SESSION:  PT End of Session - 04/13/23 1552     Visit Number 1    Number of Visits 25    Date for PT Re-Evaluation 07/09/23    Authorization Type [employer] Medicare HMO/PPO and [employer] PPO    Progress Note Due on Visit 10    PT Start Time 1534    PT Stop Time 1555    PT Time Calculation (min) 21 min    Equipment Utilized During Treatment Gait belt    Activity Tolerance Patient tolerated treatment well;Patient limited by fatigue;Patient limited by lethargy;Other (comment)    Behavior During Therapy [facility] for tasks assessed/performed;Flat affect              Past Medical History:  Diagnosis Date   Alcohol abuse    Anemia    Arthritis    Ascites    Cirrhosis ([name])    Coffee ground emesis    Dehydration 06/17/2017   Febrile illness    GERD (gastroesophageal reflux disease)    Heart murmur    History of blood transfusion    Hyperlipidemia    Hypertension    Leg swelling    Myocardial infarction ([name]) 2012   Preop cardiovascular exam 04/14/2013   Sepsis ([name]) 06/17/2017   Septic shock ([name]) 06/18/2017   SIRS (systemic inflammatory response syndrome) ([name]) 07/11/2017   Stroke ([name]) 10/2009   TIA   Thrombocytopenia ([name])    Past Surgical History:  Procedure Laterality Date   AMPUTATION Right 11/07/2022   Procedure: RIGHT ABOVE KNEE AMPUTATION;  Surgeon: Nadara Mustard, MD;  Location[facility] OR;  Service: Orthopedics;  Laterality: Right;   COLONOSCOPY WITH PROPOFOL N/A 02/07/2016   Procedure: COLONOSCOPY WITH PROPOFOL;  Surgeon: Rachael Fee, MD;  Location: [facility] ENDOSCOPY;  Service: Endoscopy;  Laterality: N/A;   ERCP N/A 02/28/2023   Procedure: ENDOSCOPIC RETROGRADE CHOLANGIOPANCREATOGRAPHY (ERCP);  Surgeon: Jeani Hawking, MD;  Location:  [facility] ENDOSCOPY;  Service: Gastroenterology;  Laterality: N/A;   ESOPHAGOGASTRODUODENOSCOPY (EGD) WITH PROPOFOL N/A 02/07/2016   Procedure: ESOPHAGOGASTRODUODENOSCOPY (EGD) WITH PROPOFOL;  Surgeon: Rachael Fee, MD;  Location: [facility] ENDOSCOPY;  Service: Endoscopy;  Laterality: N/A;   ESOPHAGOGASTRODUODENOSCOPY (EGD) WITH PROPOFOL N/A 06/22/2017   Procedure: ESOPHAGOGASTRODUODENOSCOPY (EGD) WITH PROPOFOL;  Surgeon: Beverley Fiedler, MD;  Location: [facility] ENDOSCOPY;  Service: Gastroenterology;  Laterality: N/A;   EXCISIONAL TOTAL KNEE ARTHROPLASTY WITH ANTIBIOTIC SPACERS Right 03/20/2020   Procedure: Resection right total knee arthroplasty and placement of antibiotic spacer;  Surgeon: Durene Romans, MD;  Location: [facility] ORS;  Service: Orthopedics;  Laterality: Right;  90 mins   HERNIA REPAIR Right    inguinal   INGUINAL HERNIA REPAIR Left 02/08/2021   Procedure: OPEN LEFT INGUINAL HERNIA REPAIR WITH MESH;  Surgeon: Abigail Miyamoto, MD;  Location: [facility] OR;  Service: General;  Laterality: Left;   IRRIGATION AND DEBRIDEMENT KNEE Right 11/13/2020   Procedure: IRRIGATION AND DEBRIDEMENT KNEE;  Surgeon: Durene Romans, MD;  Location: [facility] ORS;  Service: Orthopedics;  Laterality: Right;   JOINT REPLACEMENT     KNEE ARTHROSCOPY     bilateral/  12/14   REIMPLANTATION OF TOTAL KNEE Right 08/02/2020   Procedure: REIMPLANTATION/REVISION OF TOTAL KNEE WITH REMOVAL OF ANTIBIOTIC SPACER;  Surgeon: Durene Romans, MD;  Location: [facility] ORS;  Service: Orthopedics;  Laterality: Right;    REMOVAL OF STONES  02/28/2023   Procedure: REMOVAL OF DEBRIS;  Surgeon: Jeani Hawking, MD;  Location: [facility];  Service: Gastroenterology;;   Dennison Mascot  02/28/2023   Procedure: Dennison Mascot;  Surgeon: Jeani Hawking, MD;  Location: [facility] ENDOSCOPY;  Service: Gastroenterology;;   TEE WITHOUT CARDIOVERSION N/A 05/13/2018   Procedure: TRANSESOPHAGEAL ECHOCARDIOGRAM (TEE);  Surgeon: Parke Poisson, MD;  Location: [facility] ENDOSCOPY;  Service:  Cardiovascular;  Laterality: N/A;   TOTAL KNEE ARTHROPLASTY Right 05/02/2013   Procedure: RIGHT TOTAL KNEE ARTHROPLASTY;  Surgeon: Shelda Pal, MD;  Location: [facility] ORS;  Service: Orthopedics;  Laterality: Right;   Patient Active Problem List   Diagnosis Date Noted   Acute blood loss anemia 03/02/2023   Choledocholithiasis 02/28/2023   HTN (hypertension) 02/28/2023   Hypokalemia 02/28/2023   Cholelithiasis with biliary obstruction 02/27/2023   Infection of total knee replacement ([name]) 11/07/2022   Hx of AKA (above knee amputation), right ([name]) 11/07/2022   Cerebrovascular disease 07/08/2022   Renal cyst 07/08/2022   Protein-calorie malnutrition, severe 07/08/2022   Toxic metabolic encephalopathy 02/26/2022   Septic joint of right knee joint ([name]) 11/08/2020   S/P right TKA reimplantation 08/02/2020   Infection of total left knee replacement ([name]) 03/20/2020   Cirrhosis of liver without ascites ([name])    AKI (acute kidney injury) ([name])    Bacteremia    Spinal stenosis of lumbar region without neurogenic claudication    Aspiration pneumonia ([name]) 09/24/2018   Abscess in epidural space of lumbar spine    MRSA bacteremia 09/21/2018   Infection of prosthetic right knee joint ([name]) 09/20/2018   Anemia of chronic disease 09/20/2018   Hypoalbuminemia 09/20/2018   Hypoglycemia without diagnosis of diabetes mellitus 09/20/2018   Epidural abscess 09/20/2018   Hyperkalemia 05/18/2018   Edentulous 05/11/2018   Pancytopenia ([name]) 05/10/2018   CAD (coronary artery disease) 05/10/2018   Hepatic encephalopathy ([name]) 05/10/2018   Alcohol abuse 05/10/2018   GERD (gastroesophageal reflux disease) 05/10/2018   Duodenal ulcer    Renal failure    Acute on chronic anemia    Hypotension 06/17/2017   Hyponatremia 06/17/2017   Leg edema, right 06/17/2017   Acute metabolic encephalopathy 06/17/2017   Anemia, iron deficiency    Benign neoplasm of ascending colon    Hemorrhoids    Portal  hypertensive gastropathy ([name])    Gastritis and gastroduodenitis    Esophageal varices without bleeding ([name])    H/O: CVA (cerebrovascular accident) 12/01/2013   ACS (acute coronary syndrome) ([name]) 12/01/2013   Anasarca 12/01/2013   Polysubstance abuse ([name]) 12/01/2013   CKD (chronic kidney disease) stage 3, GFR 30-59 ml/min ([name]) 12/01/2013   S/P right TKA 05/02/2013   Murmur 04/14/2013   Right inguinal hernia 12/26/2010   Cirrhosis, alcoholic ([name]) 12/20/2010    ONSET DATE: 02/10/2023 prosthesis delivery  REFERRING DIAG: Z89.611 (ICD-10-CM) - Hx of AKA (above knee amputation), right   THERAPY DIAG:  Other abnormalities of gait and mobility  Unsteadiness on feet  Repeated falls  Muscle weakness (generalized)  Stiffness of right hip, not elsewhere classified  Rationale for Evaluation and Treatment: Rehabilitation  SUBJECTIVE:   SUBJECTIVE STATEMENT: Patient underwent a right Transfemoral Amputation on 11/07/2022 due to infected TKA. Initial right TKA 2015 with antibiotic spacers 2021 & 2022 with I&D. He got prosthesis on 02/10/2023.  He was under PT care for 2 visits and was discharged due to medical change. He was hospitalized 02/26/2023 - 03/03/2023 with Choledocholithiasis and underwent sphincterotomy  surgery 02/28/2023 for stone removal.  He has had a rash on arms & face.  He wore prosthesis for first time since leaving hospital 03/03/2023. He has not seen prosthetist either.  The prosthesis is crocked.   PERTINENT HISTORY: right TFA 11/07/22, inguinal hernia repair BLEs, ETOH abuse, cirrhosis, arthritis, GERD, HTN, HLD, heart murmur, MI, TIA 2011, systemic inflammatory response syndrome  PAIN:  Are you having pain? Yes: NPRS scale: today 0/10 Pain location: right residual limb more distal lateral Pain description: sharp Aggravating factors: unknown Relieving factors:  pain pill, sit to rest  PRECAUTIONS: Fall  WEIGHT BEARING RESTRICTIONS: No  FALLS: Has patient fallen  in last 6 months? Yes. Number of falls 3 times no injuries. Off balance  LIVING ENVIRONMENT: Lives with: lives with their spouse and dog 60#  Lives in: House Home Access: Stairs to enter Home layout: One level Stairs: Yes: External: 2 steps to porch without rails and threshold into house Has following equipment at home: Single point cane, Environmental consultant - 2 wheeled, Chief Operating Officer, Wheelchair (manual), and Tour manager  OCCUPATION:   on disability. 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   OBJECTIVE:  COGNITION: Evaluation on 04/13/2023:  pt appeared high and was slow answering questions.  Evaluation on 02/16/2023:  Overall cognitive status: Within functional limits for tasks assessed  POSTURE: Evaluation on 02/16/2023: rounded shoulders, forward head, flexed trunk , and weight shift left  LOWER EXTREMITY ROM:  ROM P:passive  A:active Right Eval 02/16/23  Hip flexion   Hip extension Thomas -9*  Hip abduction   Hip adduction   Hip internal rotation   Hip external rotation   Knee flexion   Knee extension   Ankle dorsiflexion   Ankle plantarflexion   Ankle inversion   Ankle eversion    (Blank rows = not tested)  LOWER EXTREMITY MMT:  MMT Right Eval 02/16/23 Left Eval 02/16/23  Hip flexion 4/5 5/5  Hip extension 3/5 4/5  Hip abduction 3+/5 4/5  Hip adduction    Hip internal rotation    Hip external rotation    Knee flexion  5/5  Knee extension  5/5  Ankle dorsiflexion  5/5  Ankle plantarflexion    Ankle inversion    Ankle eversion    At Evaluation all strength testing is grossly seated and functionally standing / gait. (Blank rows = not tested)  BED MOBILITY:  Evaluation on 02/16/2023:  Modified independent supine <>  sit  TRANSFERS: 04/13/2023:  sit to stand pulling up on //bars with supervision.  Pt unable to control prosthetic knee.   03/30/2023: Sit to stand: SBA requires use of armrests from stable chair and external support for stabilization Stand to sit: SBA requires use or armrests on stable chair and sturdy chair.   FUNCTIONAL TESTs:  04/13/2023:  deferred until pt sees prosthetist  GAIT: 03/30/2023: Gait pattern: step to pattern, decreased step length- Left, decreased stance time- Right, decreased hip/knee flexion- Right, circumduction- Right, Right hip hike, antalgic, trendelenburg, lateral hip instability, trunk flexed, abducted- Right, and poor foot clearance- Right Distance walked: 100' Assistive device utilized: Crutches and TFA prosthesis Level of assistance: SBA with crutches Comments: pt amb 3' with HHA maxA   CURRENT PROSTHETIC WEAR ASSESSMENT: Evaluation on 02/16/2023:  Patient is dependent with: skin  check, residual limb care, care of non-amputated limb, prosthetic cleaning, ply sock cleaning, correct ply sock adjustment, proper wear schedule/adjustment, and proper weight-bearing schedule/adjustment Donning prosthesis: SBA Doffing prosthesis: SBA Prosthetic wear tolerance: 2-8 hours, 1x/day5 of 6 days since delivery Prosthetic weight bearing tolerance: 5 minutes with limb pain 7/10 Edema: pitting edema Residual limb condition: no open areas, normal color, temperature & moisture, scar is mobile with slight invagination. Prosthetic description: silicon liner with velcro landyard, ischial containment socket with flexible inner liner, multi-axial hydraulic knee, dynamic response foot K code/activity level with prosthetic use: Level 3    TODAY'S TREATMENT:                                                                                                                             DATE:  04/13/2023: PT initiated evaluation but when stood in //bars, the prosthesis was misaligned.  PT  suspects that he may have rotated knee / foot in relation to socket during one of his falls.   HOME EXERCISE PROGRAM:  ASSESSMENT:  CLINICAL IMPRESSION: Patient is a 67 y.o. male who was seen today for physical therapy evaluation and treatment for prosthetic training with right Transfemoral Amputation. This PT saw patient for 2 visits then he was hospitalized.  He returns today for evaluation to resume prosthetic training with his Transfemoral prosthesis. PT was limited in assessment today by prosthesis alignment.  Prosthetic knee instability made standing balance & gait assessments unsafe.   PT set up appointment with prosthetist, Carolyne Fiscal, [facility] at [facility] on 04/14/2023 at 10:00am then PT will see back here for assessment.   02/16/2023 assessment:  Patient is dependent in prosthetic care and has limited wear of the prosthesis.  Patient has residual limb pain limiting function.  He has impaired right hip extension range and decreased strength bilateral lower extremities.  Berg balance score indicates high risk of falls and dependency and standing ADLs.  Patient has dependent prosthetic gait requiring bilateral upper extremity support of crutches and gait deviations indicating high risk of falls.  Patient would benefit from skilled PT to improve function and safety with his prosthesis.  OBJECTIVE IMPAIRMENTS: Abnormal gait, decreased activity tolerance, decreased balance, decreased endurance, decreased knowledge of condition, decreased knowledge of use of DME, decreased mobility, difficulty walking, decreased ROM, decreased strength, decreased safety awareness, postural dysfunction, prosthetic dependency , and pain.   ACTIVITY LIMITATIONS: carrying, lifting, standing, stairs, transfers, and locomotion level  PARTICIPATION LIMITATIONS: meal prep, cleaning, driving, community activity, occupation, and yard work  PERSONAL FACTORS: Education, Fitness, Past/current experiences, Time since onset of  injury/illness/exacerbation, and 3+ comorbidities: see PMH  are also affecting patient's functional outcome.   REHAB POTENTIAL: Good  CLINICAL DECISION MAKING: Evolving/moderate complexity  EVALUATION COMPLEXITY: Moderate   GOALS: Goals reviewed with patient? Yes  SHORT TERM GOALS: Target date: 05/14/2023  Patient donnes prosthesis modified independent & verbalizes proper cleaning. Baseline: SEE OBJECTIVE DATA Goal status: INITIAL 2.  Patient tolerates prosthesis >  10 hrs total /day without skin issues or limb pain >5/10 after standing. Baseline: SEE OBJECTIVE DATA Goal status: INITIAL  3.  Patient able to reach 7" and look over both shoulders without UE support with supervision. Baseline: SEE OBJECTIVE DATA Goal status: INITIAL  4. Patient ambulates 200' with crutches & prosthesis with supervision / verbal cues only. Baseline: SEE OBJECTIVE DATA Goal status: INITIAL  5. Patient negotiates ramps & curbs with crutches & prosthesis with supervision. Baseline: SEE OBJECTIVE DATA Goal status: INITIAL  LONG TERM GOALS: Target date: 07/09/2023  Patient demonstrates & verbalized understanding of prosthetic care to enable safe utilization of prosthesis. Baseline: SEE OBJECTIVE DATA Goal status: INITIAL  Patient tolerates prosthesis wear >90% of awake hours without skin or limb pain issues. Baseline: SEE OBJECTIVE DATA Goal status: INITIAL  Berg Balance >36/56 to indicate lower fall risk Baseline: SEE OBJECTIVE DATA Goal status: INITIAL  Patient ambulates >300' with prosthesis and cane or less independently Baseline: SEE OBJECTIVE DATA Goal status: INITIAL  Patient negotiates ramps, curbs & stairs with single rail with prosthesis and cane independently. Baseline: SEE OBJECTIVE DATA Goal status: INITIAL    PLAN:  PT FREQUENCY: 2x/week  PT DURATION: 13 weeks / 90 days  PLANNED INTERVENTIONS: 97164- PT Re-evaluation, 97110-Therapeutic exercises, 97530- Therapeutic  activity, 97112- Neuromuscular re-education, 97535- Self Care, [id number]- Manual therapy, [phone]- Gait training, [phone]- Prosthetic training, Patient/Family education, Balance training, Stair training, Vestibular training, DME instructions, Therapeutic exercises, Therapeutic activity, Neuromuscular re-education, Gait training, and Self Care  PLAN FOR NEXT SESSION: assess balance & gait,   prosthetic care education.   Vladimir Faster, PT, DPT 04/13/2023, 4:12 PM

## 2023-04-15 ENCOUNTER — Encounter: Payer: Medicare HMO | Admitting: Physical Therapy

## 2023-04-15 ENCOUNTER — Encounter: Payer: Self-pay | Admitting: Physical Therapy

## 2023-04-15 ENCOUNTER — Ambulatory Visit (INDEPENDENT_AMBULATORY_CARE_PROVIDER_SITE_OTHER): Payer: Medicare HMO | Admitting: Physical Therapy

## 2023-04-15 DIAGNOSIS — R296 Repeated falls: Secondary | ICD-10-CM

## 2023-04-15 DIAGNOSIS — R2681 Unsteadiness on feet: Secondary | ICD-10-CM | POA: Diagnosis not present

## 2023-04-15 DIAGNOSIS — M6281 Muscle weakness (generalized): Secondary | ICD-10-CM | POA: Diagnosis not present

## 2023-04-15 DIAGNOSIS — R2689 Other abnormalities of gait and mobility: Secondary | ICD-10-CM

## 2023-04-15 DIAGNOSIS — M25651 Stiffness of right hip, not elsewhere classified: Secondary | ICD-10-CM | POA: Diagnosis not present

## 2023-04-15 NOTE — Therapy (Signed)
OUTPATIENT PHYSICAL THERAPY PROSTHETIC TREATMENT   Patient Name: Mark [name]. MRN: [id number] DOB:Sep 10, 1955, 67 y.o., male Today's Date: 04/15/2023  PCP: Abbe Amsterdam, MD REFERRING PROVIDER: Aldean Baker, MD  END OF SESSION:  PT End of Session - 04/15/23 1503     Visit Number 2    Number of Visits 25    Date for PT Re-Evaluation 07/09/23    Authorization Type [employer] Medicare HMO/PPO and [employer] PPO    Progress Note Due on Visit 10    PT Start Time 1500    PT Stop Time 1542    PT Time Calculation (min) 42 min    Equipment Utilized During Treatment Gait belt    Activity Tolerance Patient tolerated treatment well;Patient limited by fatigue;Patient limited by lethargy;Other (comment)    Behavior During Therapy [facility] for tasks assessed/performed;Flat affect               Past Medical History:  Diagnosis Date   Alcohol abuse    Anemia    Arthritis    Ascites    Cirrhosis ([name])    Coffee ground emesis    Dehydration 06/17/2017   Febrile illness    GERD (gastroesophageal reflux disease)    Heart murmur    History of blood transfusion    Hyperlipidemia    Hypertension    Leg swelling    Myocardial infarction ([name]) 2012   Preop cardiovascular exam 04/14/2013   Sepsis ([name]) 06/17/2017   Septic shock ([name]) 06/18/2017   SIRS (systemic inflammatory response syndrome) ([name]) 07/11/2017   Stroke ([name]) 10/2009   TIA   Thrombocytopenia ([name])    Past Surgical History:  Procedure Laterality Date   AMPUTATION Right 11/07/2022   Procedure: RIGHT ABOVE KNEE AMPUTATION;  Surgeon: Nadara Mustard, MD;  Location: [facility] OR;  Service: Orthopedics;  Laterality: Right;   COLONOSCOPY WITH PROPOFOL N/A 02/07/2016   Procedure: COLONOSCOPY WITH PROPOFOL;  Surgeon: Rachael Fee, MD;  Location: [facility] ENDOSCOPY;  Service: Endoscopy;  Laterality: N/A;   ERCP N/A 02/28/2023   Procedure: ENDOSCOPIC RETROGRADE CHOLANGIOPANCREATOGRAPHY (ERCP);  Surgeon: Jeani Hawking, MD;  Location:  [facility] ENDOSCOPY;  Service: Gastroenterology;  Laterality: N/A;   ESOPHAGOGASTRODUODENOSCOPY (EGD) WITH PROPOFOL N/A 02/07/2016   Procedure: ESOPHAGOGASTRODUODENOSCOPY (EGD) WITH PROPOFOL;  Surgeon: Rachael Fee, MD;  Location: [facility] ENDOSCOPY;  Service: Endoscopy;  Laterality: N/A;   ESOPHAGOGASTRODUODENOSCOPY (EGD) WITH PROPOFOL N/A 06/22/2017   Procedure: ESOPHAGOGASTRODUODENOSCOPY (EGD) WITH PROPOFOL;  Surgeon: Beverley Fiedler, MD;  Location: [facility] ENDOSCOPY;  Service: Gastroenterology;  Laterality: N/A;   EXCISIONAL TOTAL KNEE ARTHROPLASTY WITH ANTIBIOTIC SPACERS Right 03/20/2020   Procedure: Resection right total knee arthroplasty and placement of antibiotic spacer;  Surgeon: Durene Romans, MD;  Location: [facility] ORS;  Service: Orthopedics;  Laterality: Right;  90 mins   HERNIA REPAIR Right    inguinal   INGUINAL HERNIA REPAIR Left 02/08/2021   Procedure: OPEN LEFT INGUINAL HERNIA REPAIR WITH MESH;  Surgeon: Abigail Miyamoto, MD;  Location: [facility] OR;  Service: General;  Laterality: Left;   IRRIGATION AND DEBRIDEMENT KNEE Right 11/13/2020   Procedure: IRRIGATION AND DEBRIDEMENT KNEE;  Surgeon: Durene Romans, MD;  Location: [facility] ORS;  Service: Orthopedics;  Laterality: Right;   JOINT REPLACEMENT     KNEE ARTHROSCOPY     bilateral/  12/14   REIMPLANTATION OF TOTAL KNEE Right 08/02/2020   Procedure: REIMPLANTATION/REVISION OF TOTAL KNEE WITH REMOVAL OF ANTIBIOTIC SPACER;  Surgeon: Durene Romans, MD;  Location: [facility] ORS;  Service: Orthopedics;  Laterality: Right;    REMOVAL OF STONES  02/28/2023   Procedure: REMOVAL OF DEBRIS;  Surgeon: Jeani Hawking, MD;  Location: [facility];  Service: Gastroenterology;;   Dennison Mascot  02/28/2023   Procedure: Dennison Mascot;  Surgeon: Jeani Hawking, MD;  Location: [facility] ENDOSCOPY;  Service: Gastroenterology;;   TEE WITHOUT CARDIOVERSION N/A 05/13/2018   Procedure: TRANSESOPHAGEAL ECHOCARDIOGRAM (TEE);  Surgeon: Parke Poisson, MD;  Location: [facility] ENDOSCOPY;  Service:  Cardiovascular;  Laterality: N/A;   TOTAL KNEE ARTHROPLASTY Right 05/02/2013   Procedure: RIGHT TOTAL KNEE ARTHROPLASTY;  Surgeon: Shelda Pal, MD;  Location: [facility] ORS;  Service: Orthopedics;  Laterality: Right;   Patient Active Problem List   Diagnosis Date Noted   Acute blood loss anemia 03/02/2023   Choledocholithiasis 02/28/2023   HTN (hypertension) 02/28/2023   Hypokalemia 02/28/2023   Cholelithiasis with biliary obstruction 02/27/2023   Infection of total knee replacement ([name]) 11/07/2022   Hx of AKA (above knee amputation), right ([name]) 11/07/2022   Cerebrovascular disease 07/08/2022   Renal cyst 07/08/2022   Protein-calorie malnutrition, severe 07/08/2022   Toxic metabolic encephalopathy 02/26/2022   Septic joint of right knee joint ([name]) 11/08/2020   S/P right TKA reimplantation 08/02/2020   Infection of total left knee replacement ([name]) 03/20/2020   Cirrhosis of liver without ascites ([name])    AKI (acute kidney injury) ([name])    Bacteremia    Spinal stenosis of lumbar region without neurogenic claudication    Aspiration pneumonia ([name]) 09/24/2018   Abscess in epidural space of lumbar spine    MRSA bacteremia 09/21/2018   Infection of prosthetic right knee joint ([name]) 09/20/2018   Anemia of chronic disease 09/20/2018   Hypoalbuminemia 09/20/2018   Hypoglycemia without diagnosis of diabetes mellitus 09/20/2018   Epidural abscess 09/20/2018   Hyperkalemia 05/18/2018   Edentulous 05/11/2018   Pancytopenia ([name]) 05/10/2018   CAD (coronary artery disease) 05/10/2018   Hepatic encephalopathy ([name]) 05/10/2018   Alcohol abuse 05/10/2018   GERD (gastroesophageal reflux disease) 05/10/2018   Duodenal ulcer    Renal failure    Acute on chronic anemia    Hypotension 06/17/2017   Hyponatremia 06/17/2017   Leg edema, right 06/17/2017   Acute metabolic encephalopathy 06/17/2017   Anemia, iron deficiency    Benign neoplasm of ascending colon    Hemorrhoids    Portal  hypertensive gastropathy ([name])    Gastritis and gastroduodenitis    Esophageal varices without bleeding ([name])    H/O: CVA (cerebrovascular accident) 12/01/2013   ACS (acute coronary syndrome) ([name]) 12/01/2013   Anasarca 12/01/2013   Polysubstance abuse ([name]) 12/01/2013   CKD (chronic kidney disease) stage 3, GFR 30-59 ml/min ([name]) 12/01/2013   S/P right TKA 05/02/2013   Murmur 04/14/2013   Right inguinal hernia 12/26/2010   Cirrhosis, alcoholic ([name]) 12/20/2010    ONSET DATE: 02/10/2023 prosthesis delivery  REFERRING DIAG: Z89.611 (ICD-10-CM) - Hx of AKA (above knee amputation), right   THERAPY DIAG:  Other abnormalities of gait and mobility  Unsteadiness on feet  Repeated falls  Muscle weakness (generalized)  Stiffness of right hip, not elsewhere classified  Rationale for Evaluation and Treatment: Rehabilitation  SUBJECTIVE:   SUBJECTIVE STATEMENT: He saw prosthetist yesterday who made changes but pt does not feel like it is right yet.  He thinks that the prosthesis is too tall.  He wore prosthesis from 7:30am to 7:00pm and today donned ~11:00am.    PERTINENT HISTORY: right TFA 11/07/22, inguinal hernia repair BLEs, ETOH abuse, cirrhosis, arthritis, GERD, HTN,  HLD, heart murmur, MI, TIA 2011, systemic inflammatory response syndrome  PAIN:  Are you having pain? Yes: NPRS scale: today 0/10 Pain location: right residual limb more distal lateral Pain description: sharp Aggravating factors: unknown Relieving factors:  pain pill, sit to rest  PRECAUTIONS: Fall  WEIGHT BEARING RESTRICTIONS: No  FALLS: Has patient fallen in last 6 months? Yes. Number of falls 3 times no injuries. Off balance  LIVING ENVIRONMENT: Lives with: lives with their spouse and dog 60#  Lives in: House Home Access: Stairs to enter Home layout: One level Stairs: Yes: External: 2 steps to porch without rails and threshold into house Has following equipment at home: Single point cane, Environmental consultant - 2  wheeled, Chief Operating Officer, Wheelchair (manual), and Tour manager  OCCUPATION:   on disability. 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   OBJECTIVE:  COGNITION: Evaluation on 04/13/2023:  pt appeared high and was slow answering questions.  Evaluation on 02/16/2023:  Overall cognitive status: Within functional limits for tasks assessed  POSTURE: Evaluation on 02/16/2023: rounded shoulders, forward head, flexed trunk , and weight shift left  LOWER EXTREMITY ROM:  ROM P:passive  A:active Right Eval 02/16/23  Hip flexion   Hip extension Thomas -9*  Hip abduction   Hip adduction   Hip internal rotation   Hip external rotation   Knee flexion   Knee extension   Ankle dorsiflexion   Ankle plantarflexion   Ankle inversion   Ankle eversion    (Blank rows = not tested)  LOWER EXTREMITY MMT:  MMT Right Eval 02/16/23 Left Eval 02/16/23  Hip flexion 4/5 5/5  Hip extension 3/5 4/5  Hip abduction 3+/5 4/5  Hip adduction    Hip internal rotation    Hip external rotation    Knee flexion  5/5  Knee extension  5/5  Ankle dorsiflexion  5/5  Ankle plantarflexion    Ankle inversion    Ankle eversion    At Evaluation all strength testing is grossly seated and functionally standing / gait. (Blank rows = not tested)  BED MOBILITY:  Evaluation on 02/16/2023:  Modified independent supine <> sit  TRANSFERS: 04/13/2023:  sit to stand pulling up on //bars with supervision.  Pt unable to control prosthetic knee.   03/30/2023: Sit to stand: SBA requires use of armrests from stable chair and external support for stabilization Stand to sit: SBA requires use or armrests on stable chair and sturdy chair.   FUNCTIONAL TESTs:  04/15/2023:  patient  requires minA for static balance 60 seconds.    GAIT: 04/15/2023: Gait pattern: step to pattern, decreased step length- Left, decreased stance time- Right, decreased hip/knee flexion- Right, circumduction- Right, Right hip hike, antalgic, trendelenburg, lateral hip instability, trunk flexed, abducted- Right, and poor foot clearance- Right POOR weight shift onto prosthesis in stance,  Distance walked: 100' Assistive device utilized: Crutches and TFA prosthesis Level of assistance: SBA with crutches   03/30/2023: Gait pattern: step to pattern, decreased step length- Left, decreased stance time- Right, decreased hip/knee flexion- Right, circumduction- Right, Right hip hike, antalgic, trendelenburg, lateral hip instability, trunk flexed, abducted- Right, and poor foot clearance- Right Distance walked: 100' Assistive device utilized: Crutches and TFA prosthesis Level of assistance: SBA with crutches Comments: pt amb 3' with HHA maxA  CURRENT PROSTHETIC WEAR ASSESSMENT: Evaluation on 02/16/2023:  Patient is dependent with: skin check, residual limb care, care of non-amputated limb, prosthetic cleaning, ply sock cleaning, correct ply sock adjustment, proper wear schedule/adjustment, and proper weight-bearing schedule/adjustment Donning prosthesis: SBA Doffing prosthesis: SBA Prosthetic wear tolerance: 2-8 hours, 1x/day5 of 6 days since delivery Prosthetic weight bearing tolerance: 5 minutes with limb pain 7/10 Edema: pitting edema Residual limb condition: no open areas, normal color, temperature & moisture, scar is mobile with slight invagination. Prosthetic description: silicon liner with velcro landyard, ischial containment socket with flexible inner liner, multi-axial hydraulic knee, dynamic response foot K code/activity level with prosthetic use: Level 3    TODAY'S TREATMENT:                                                                                                                              DATE:  04/15/2023: Prosthetic Training with Transfemoral Prosthesis:  PT demo & verbal cues on donning liner with proper rotation to velcro suspension strap, folding sock over proximal socket to limit sock bunching up inside socket and donning prosthesis with slight toe out with weight on prosthesis tightening suspension strap.  PT recommended checking & tightening suspension strap every time that he is the bathroom and only tightening in standing with weight on prosthesis.  Pt verbalized understanding.  PT added 1/4" heel lift to left shoe to compensate for pt perception of prosthesis too long.  PT adjusted pt's crutch handles which improved their use.  PT demo & verbal cues 3 point pattern with prosthetic foot directly bw crutch tips.  Weight shift over prosthesis in stance prior to stepping LLE.  Pt amb 50' & 100' with axillary crutches with tactile & verbal cues on carryover of above.   Standing balance at sink with PT manual assist to maintain prosthetic knee extension & tactile / verbal cues on weight shift: BUE support shifting left/midline/right with cues to use socket pressure for proprioception, shifting back heel presssure / midline/ forward toe pressure with cues to use socket pressure for proprioception, weight midline reaching over head with single UE support and looking over shoulders with single UE support.      04/13/2023: PT initiated evaluation but when stood in //bars, the prosthesis was misaligned.  PT suspects that he may have rotated knee / foot in relation to socket during one of his falls.   HOME EXERCISE PROGRAM:  ASSESSMENT:  CLINICAL IMPRESSION: Patient improved static balance after PT worked on putting weight on prosthesis.  Pt improved prosthetic gait with PT instruction on weight shift and foot placement.  Patient benefits from skilled PT to improve function & safety.     OBJECTIVE IMPAIRMENTS: Abnormal gait, decreased activity tolerance, decreased  balance, decreased endurance, decreased knowledge of condition, decreased knowledge of use of DME, decreased mobility, difficulty walking, decreased ROM, decreased strength, decreased safety awareness, postural dysfunction, prosthetic dependency , and pain.   ACTIVITY LIMITATIONS: carrying, lifting, standing, stairs, transfers,  and locomotion level  PARTICIPATION LIMITATIONS: meal prep, cleaning, driving, community activity, occupation, and yard work  PERSONAL FACTORS: Education, Fitness, Past/current experiences, Time since onset of injury/illness/exacerbation, and 3+ comorbidities: see PMH  are also affecting patient's functional outcome.   REHAB POTENTIAL: Good  CLINICAL DECISION MAKING: Evolving/moderate complexity  EVALUATION COMPLEXITY: Moderate   GOALS: Goals reviewed with patient? Yes  SHORT TERM GOALS: Target date: 05/14/2023  Patient donnes prosthesis modified independent & verbalizes proper cleaning. Baseline: SEE OBJECTIVE DATA Goal status: INITIAL 2.  Patient tolerates prosthesis >10 hrs total /day without skin issues or limb pain >5/10 after standing. Baseline: SEE OBJECTIVE DATA Goal status: INITIAL  3.  Patient able to reach 7" and look over both shoulders without UE support with supervision. Baseline: SEE OBJECTIVE DATA Goal status: INITIAL  4. Patient ambulates 200' with crutches & prosthesis with supervision / verbal cues only. Baseline: SEE OBJECTIVE DATA Goal status: INITIAL  5. Patient negotiates ramps & curbs with crutches & prosthesis with supervision. Baseline: SEE OBJECTIVE DATA Goal status: INITIAL  LONG TERM GOALS: Target date: 07/09/2023  Patient demonstrates & verbalized understanding of prosthetic care to enable safe utilization of prosthesis. Baseline: SEE OBJECTIVE DATA Goal status: INITIAL  Patient tolerates prosthesis wear >90% of awake hours without skin or limb pain issues. Baseline: SEE OBJECTIVE DATA Goal status: INITIAL  Berg  Balance >36/56 to indicate lower fall risk Baseline: SEE OBJECTIVE DATA Goal status: INITIAL  Patient ambulates >300' with prosthesis and cane or less independently Baseline: SEE OBJECTIVE DATA Goal status: INITIAL  Patient negotiates ramps, curbs & stairs with single rail with prosthesis and cane independently. Baseline: SEE OBJECTIVE DATA Goal status: INITIAL    PLAN:  PT FREQUENCY: 2x/week  PT DURATION: 13 weeks / 90 days  PLANNED INTERVENTIONS: 97164- PT Re-evaluation, 97110-Therapeutic exercises, 97530- Therapeutic activity, 97112- Neuromuscular re-education, 97535- Self Care, 84696- Manual therapy, [id number]- Gait training, [id number]- Prosthetic training, Patient/Family education, Balance training, Stair training, Vestibular training, DME instructions, Therapeutic exercises, Therapeutic activity, Neuromuscular re-education, Gait training, and Self Care  PLAN FOR NEXT SESSION: prosthetic training for balance & gait,  Vladimir Faster, PT, DPT 04/15/2023, 4:07 PM

## 2023-04-17 ENCOUNTER — Encounter (HOSPITAL_COMMUNITY): Payer: Self-pay | Admitting: Gastroenterology

## 2023-04-28 ENCOUNTER — Encounter: Payer: Self-pay | Admitting: Physical Therapy

## 2023-04-28 ENCOUNTER — Ambulatory Visit (INDEPENDENT_AMBULATORY_CARE_PROVIDER_SITE_OTHER): Payer: Medicare HMO | Admitting: Physical Therapy

## 2023-04-28 DIAGNOSIS — R2681 Unsteadiness on feet: Secondary | ICD-10-CM

## 2023-04-28 DIAGNOSIS — M25651 Stiffness of right hip, not elsewhere classified: Secondary | ICD-10-CM | POA: Diagnosis not present

## 2023-04-28 DIAGNOSIS — R296 Repeated falls: Secondary | ICD-10-CM | POA: Diagnosis not present

## 2023-04-28 DIAGNOSIS — M6281 Muscle weakness (generalized): Secondary | ICD-10-CM | POA: Diagnosis not present

## 2023-04-28 DIAGNOSIS — R2689 Other abnormalities of gait and mobility: Secondary | ICD-10-CM

## 2023-04-28 NOTE — Therapy (Signed)
 OUTPATIENT PHYSICAL THERAPY PROSTHETIC TREATMENT   Patient Name: Mark [name]. MRN: [id number] DOB:22-Apr-1956, 67 y.o., male Today's Date: 04/28/2023  PCP: Clotilda Single, MD REFERRING PROVIDER: Jerona Sage, MD  END OF SESSION:  PT End of Session - 04/28/23 1537     Visit Number 3    Number of Visits 25    Date for PT Re-Evaluation 07/09/23    Authorization Type [employer] Medicare HMO/PPO and [employer] PPO    Progress Note Due on Visit 10    PT Start Time 1532    PT Stop Time 1602    PT Time Calculation (min) 30 min    Equipment Utilized During Treatment Gait belt    Activity Tolerance Patient tolerated treatment well;Patient limited by fatigue;Patient limited by lethargy;Other (comment)    Behavior During Therapy WFL for tasks assessed/performed;Flat affect                Past Medical History:  Diagnosis Date   Alcohol  abuse    Anemia    Arthritis    Ascites    Cirrhosis ([name])    Coffee ground emesis    Dehydration 06/17/2017   Febrile illness    GERD (gastroesophageal reflux disease)    Heart murmur    History of blood transfusion    Hyperlipidemia    Hypertension    Leg swelling    Myocardial infarction ([name]) 2012   Preop cardiovascular exam 04/14/2013   Sepsis ([name]) 06/17/2017   Septic shock ([name]) 06/18/2017   SIRS (systemic inflammatory response syndrome) ([name]) 07/11/2017   Stroke ([name]) 10/2009   TIA   Thrombocytopenia ([name])    Past Surgical History:  Procedure Laterality Date   AMPUTATION Right 11/07/2022   Procedure: RIGHT ABOVE KNEE AMPUTATION;  Surgeon: Sage Jerona GAILS, MD;  Location: [facility] OR;  Service: Orthopedics;  Laterality: Right;   COLONOSCOPY WITH PROPOFOL  N/A 02/07/2016   Procedure: COLONOSCOPY WITH PROPOFOL ;  Surgeon: Toribio SHAUNNA Cedar, MD;  Location: [facility] END[facility]COPY;  Service: Endoscopy;  Laterality: N/A;   ERCP N/A 02/28/2023   Procedure: ENDOSCOPIC RETROGRADE CHOLANGIOPANCREATOGRAPHY (ERCP);  Surgeon: Rollin Dover, MD;   Location: [facility];  Service: Gastroenterology;  Laterality: N/A;   ESOPHAGOGASTRODUODENOSCOPY (EGD) WITH PROPOFOL  N/A 02/07/2016   Procedure: ESOPHAGOGASTRODUODENOSCOPY (EGD) WITH PROPOFOL ;  Surgeon: Toribio SHAUNNA Cedar, MD;  Location: [facility];  Service: Endoscopy;  Laterality: N/A;   ESOPHAGOGASTRODUODENOSCOPY (EGD) WITH PROPOFOL  N/A 06/22/2017   Procedure: ESOPHAGOGASTRODUODENOSCOPY (EGD) WITH PROPOFOL ;  Surgeon: Albertus Gordy HERO, MD;  Location: [facility] ENDOSCOPY;  Service: Gastroenterology;  Laterality: N/A;   EXCISIONAL TOTAL KNEE ARTHROPLASTY WITH ANTIBIOTIC SPACERS Right 03/20/2020   Procedure: Resection right total knee arthroplasty and placement of antibiotic spacer;  Surgeon: Ernie Cough, MD;  Location: [facility] ORS;  Service: Orthopedics;  Laterality: Right;  90 mins   HERNIA REPAIR Right    inguinal   INGUINAL HERNIA REPAIR Left 02/08/2021   Procedure: OPEN LEFT INGUINAL HERNIA REPAIR WITH MESH;  Surgeon: Vernetta Berg, MD;  Location: [facility] OR;  Service: General;  Laterality: Left;   IRRIGATION AND DEBRIDEMENT KNEE Right 11/13/2020   Procedure: IRRIGATION AND DEBRIDEMENT KNEE;  Surgeon: Ernie Cough, MD;  Location: [facility]S;  Service: Orthopedics;  Laterality: Right;   JOINT REPLACEMENT     KNEE ARTHROSCOPY     bilateral/  12/14   REIMPLANTATION OF TOTAL KNEE Right 08/02/2020   Procedure: REIMPLANTATION/REVISION OF TOTAL KNEE WITH REMOVAL OF ANTIBIOTIC SPACER;  Surgeon: Ernie Cough, MD;  Location: [facility];  Service:  Orthopedics;  Laterality: Right;    REMOVAL OF STONES  02/28/2023   Procedure: REMOVAL OF DEBRIS;  Surgeon: Rollin Dover, MD;  Location: [facility];  Service: Gastroenterology;;   ANNETT  02/28/2023   Procedure: ANNETT;  Surgeon: Rollin Dover, MD;  Location: [facility];  Service: Gastroenterology;;   TEE WITHOUT CARDIOVERSION N/A 05/13/2018   Procedure: TRANSESOPHAGEAL ECHOCARDIOGRAM (TEE);  Surgeon: Loni Soyla LABOR, MD;  Location: [facility] ENDOSCOPY;   Service: Cardiovascular;  Laterality: N/A;   TOTAL KNEE ARTHROPLASTY Right 05/02/2013   Procedure: RIGHT TOTAL KNEE ARTHROPLASTY;  Surgeon: Donnice JONETTA Car, MD;  Location: [facility];  Service: Orthopedics;  Laterality: Right;   Patient Active Problem List   Diagnosis Date Noted   Acute blood loss anemia 03/02/2023   Choledocholithiasis 02/28/2023   HTN (hypertension) 02/28/2023   Hypokalemia 02/28/2023   Cholelithiasis with biliary obstruction 02/27/2023   Infection of total knee replacement ([name]) 11/07/2022   Hx of AKA (above knee amputation), right ([name]) 11/07/2022   Cerebrovascular disease 07/08/2022   Renal cyst 07/08/2022   Protein-calorie malnutrition, severe 07/08/2022   Toxic metabolic encephalopathy 02/26/2022   Septic joint of right knee joint ([name]) 11/08/2020   S/P right TKA reimplantation 08/02/2020   Infection of total left knee replacement ([name]) 03/20/2020   Cirrhosis of liver without ascites ([name])    AKI (acute kidney injury) ([name])    Bacteremia    Spinal stenosis of lumbar region without neurogenic claudication    Aspiration pneumonia ([name]) 09/24/2018   Abscess in epidural space of lumbar spine    MRSA bacteremia 09/21/2018   Infection of prosthetic right knee joint ([name]) 09/20/2018   Anemia of chronic disease 09/20/2018   Hypoalbuminemia 09/20/2018   Hypoglycemia without diagnosis of diabetes mellitus 09/20/2018   Epidural abscess 09/20/2018   Hyperkalemia 05/18/2018   Edentulous 05/11/2018   Pancytopenia ([name]) 05/10/2018   CAD (coronary artery disease) 05/10/2018   Hepatic encephalopathy ([name]) 05/10/2018   Alcohol  abuse 05/10/2018   GERD (gastroesophageal reflux disease) 05/10/2018   Duodenal ulcer    Renal failure    Acute on chronic anemia    Hypotension 06/17/2017   Hyponatremia 06/17/2017   Leg edema, right 06/17/2017   Acute metabolic encephalopathy 06/17/2017   Anemia, iron  deficiency    Benign neoplasm of ascending colon    Hemorrhoids    Portal  hypertensive gastropathy ([name])    Gastritis and gastroduodenitis    Esophageal varices without bleeding ([name])    H/O: CVA (cerebrovascular accident) 12/01/2013   ACS (acute coronary syndrome) ([name]) 12/01/2013   Anasarca 12/01/2013   Polysubstance abuse ([name]) 12/01/2013   CKD (chronic kidney disease) stage 3, GFR 30-59 ml/min ([name]) 12/01/2013   S/P right TKA 05/02/2013   Murmur 04/14/2013   Right inguinal hernia 12/26/2010   Cirrhosis, alcoholic ([name]) 12/20/2010    ONSET DATE: 02/10/2023 prosthesis delivery  REFERRING DIAG: Z89.611 (ICD-10-CM) - Hx of AKA (above knee amputation), right   THERAPY DIAG:  Other abnormalities of gait and mobility  Unsteadiness on feet  Repeated falls  Muscle weakness (generalized)  Stiffness of right hip, not elsewhere classified  Rationale for Evaluation and Treatment: Rehabilitation  SUBJECTIVE:   SUBJECTIVE STATEMENT: He tries to wear prosthesis every day but the leg has been rotated.  He has fallen 3 times all due to prosthesis issues.  No injuries.   PERTINENT HISTORY: right TFA 11/07/22, inguinal hernia repair BLEs, ETOH abuse, cirrhosis, arthritis, GERD, HTN, HLD, heart murmur, MI, TIA 2011, systemic inflammatory response syndrome  PAIN:  Are you having pain? Yes: NPRS scale: today  0/10 Pain location: right residual limb more distal lateral Pain description: sharp Aggravating factors: unknown Relieving factors:  pain pill, sit to rest  PRECAUTIONS: Fall  WEIGHT BEARING RESTRICTIONS: No  FALLS: Has patient fallen in last 6 months? Yes. Number of falls 3 times no injuries. Off balance  LIVING ENVIRONMENT: Lives with: lives with their spouse and dog 60#  Lives in: House Home Access: Stairs to enter Home layout: One level Stairs: Yes: External: 2 steps to porch without rails and threshold into house Has following equipment at home: Single point cane, Environmental Consultant - 2 wheeled, Chief Operating Officer, Wheelchair (manual), and Nurse, mental health  OCCUPATION:   on disability. 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   OBJECTIVE:  COGNITION: Evaluation on 04/13/2023:  pt appeared high and was slow answering questions.  Evaluation on 02/16/2023:  Overall cognitive status: Within functional limits for tasks assessed  POSTURE: Evaluation on 02/16/2023: rounded shoulders, forward head, flexed trunk , and weight shift left  LOWER EXTREMITY ROM:  ROM P:passive  A:active Right Eval 02/16/23  Hip flexion   Hip extension Thomas -9*  Hip abduction   Hip adduction   Hip internal rotation   Hip external rotation   Knee flexion   Knee extension   Ankle dorsiflexion   Ankle plantarflexion   Ankle inversion   Ankle eversion    (Blank rows = not tested)  LOWER EXTREMITY MMT:  MMT Right Eval 02/16/23 Left Eval 02/16/23  Hip flexion 4/5 5/5  Hip extension 3/5 4/5  Hip abduction 3+/5 4/5  Hip adduction    Hip internal rotation    Hip external rotation    Knee flexion  5/5  Knee extension  5/5  Ankle dorsiflexion  5/5  Ankle plantarflexion    Ankle inversion    Ankle eversion    At Evaluation all strength testing is grossly seated and functionally standing / gait. (Blank rows = not tested)  BED MOBILITY:  Evaluation on 02/16/2023:  Modified independent supine <> sit  TRANSFERS: 04/13/2023:  sit to stand pulling up on //bars with supervision.  Pt unable to control prosthetic knee.   03/30/2023: Sit to stand: SBA requires use of armrests from stable chair and external support for stabilization Stand to sit: SBA requires use or armrests on stable chair and sturdy chair.   FUNCTIONAL TESTs:  04/15/2023:  patient requires minA for static balance 60 seconds.     GAIT: 04/15/2023: Gait pattern: step to pattern, decreased step length- Left, decreased stance time- Right, decreased hip/knee flexion- Right, circumduction- Right, Right hip hike, antalgic, trendelenburg, lateral hip instability, trunk flexed, abducted- Right, and poor foot clearance- Right POOR weight shift onto prosthesis in stance,  Distance walked: 100' Assistive device utilized: Crutches and TFA prosthesis Level of assistance: SBA with crutches   03/30/2023: Gait pattern: step to pattern, decreased step length- Left, decreased stance time- Right, decreased hip/knee flexion- Right, circumduction- Right, Right hip hike, antalgic, trendelenburg, lateral hip instability, trunk flexed, abducted- Right, and poor foot clearance- Right Distance walked: 100' Assistive device utilized: Crutches and TFA prosthesis Level of assistance: SBA with crutches Comments: pt amb 3' with HHA maxA   CURRENT PROSTHETIC WEAR ASSESSMENT: Evaluation on 02/16/2023:  Patient  is dependent with: skin check, residual limb care, care of non-amputated limb, prosthetic cleaning, ply sock cleaning, correct ply sock adjustment, proper wear schedule/adjustment, and proper weight-bearing schedule/adjustment Donning prosthesis: SBA Doffing prosthesis: SBA Prosthetic wear tolerance: 2-8 hours, 1x/day5 of 6 days since delivery Prosthetic weight bearing tolerance: 5 minutes with limb pain 7/10 Edema: pitting edema Residual limb condition: no open areas, normal color, temperature & moisture, scar is mobile with slight invagination. Prosthetic description: silicon liner with velcro landyard, ischial containment socket with flexible inner liner, multi-axial hydraulic knee, dynamic response foot K code/activity level with prosthetic use: Level 3    TODAY'S TREATMENT:                                                                                                                             DATE:  04/28/2023: Prosthetic  Training with Transfemoral Prosthesis:  PT reviewed verbally and demo on tightening suspension strap in standing with weight on prosthesis & neutral pelvis.  PT recommended tightening in morning when donning prosthesis and checking strap throughout day primarily in bathroom while he would have his pants down so the strap is accessible.  PT also reminded him to fold sock over proximal socket to minimize having the sock bunch up inside the socket.  Pt verbalized understanding.  Pt amb 100' X 2 supervision / cues with axillary crutches 3-point pattern with PT tactile and verbal cues on step length, weight shift over prosthesis in stance and prosthetic knee flexion in terminal stance to advance prosthesis.  PT demo & verbal cues on sit to/from stand 18 chairs without armrests using LUE on seat. Pt able to return demo with verbal cues.  PT demo & verbal cues on technique to negotiate curb and ramp.  Pt neg 6.5 curb & 12* incline with axillary crutches with minA and tactile / verbal cues.     TREATMENT:                                                                                                                             DATE:  04/15/2023: Prosthetic Training with Transfemoral Prosthesis:  PT demo & verbal cues on donning liner with proper rotation to velcro suspension strap, folding sock over proximal socket to limit sock bunching up inside socket and donning prosthesis with slight toe out with weight on prosthesis tightening suspension strap.  PT recommended checking & tightening suspension strap every time that  he is the bathroom and only tightening in standing with weight on prosthesis.  Pt verbalized understanding.  PT added 1/4 heel lift to left shoe to compensate for pt perception of prosthesis too long.  PT adjusted pt's crutch handles which improved their use.  PT demo & verbal cues 3 point pattern with prosthetic foot directly bw crutch tips.  Weight shift over prosthesis in stance prior to  stepping LLE.  Pt amb 50' & 100' with axillary crutches with tactile & verbal cues on carryover of above.   Standing balance at sink with PT manual assist to maintain prosthetic knee extension & tactile / verbal cues on weight shift: BUE support shifting left/midline/right with cues to use socket pressure for proprioception, shifting back heel presssure / midline/ forward toe pressure with cues to use socket pressure for proprioception, weight midline reaching over head with single UE support and looking over shoulders with single UE support.      04/13/2023: PT initiated evaluation but when stood in //bars, the prosthesis was misaligned.  PT suspects that he may have rotated knee / foot in relation to socket during one of his falls.   HOME EXERCISE PROGRAM:  ASSESSMENT:  CLINICAL IMPRESSION: Patient was late to session so limited activities.  Pt had improved gait leaving PT clinic with suspension strap tight and some carryover to gait instructions.  Patient continues to need lots of repetition for instructions. Patient benefits from skilled PT to improve function & safety.     OBJECTIVE IMPAIRMENTS: Abnormal gait, decreased activity tolerance, decreased balance, decreased endurance, decreased knowledge of condition, decreased knowledge of use of DME, decreased mobility, difficulty walking, decreased ROM, decreased strength, decreased safety awareness, postural dysfunction, prosthetic dependency , and pain.   ACTIVITY LIMITATIONS: carrying, lifting, standing, stairs, transfers, and locomotion level  PARTICIPATION LIMITATIONS: meal prep, cleaning, driving, community activity, occupation, and yard work  PERSONAL FACTORS: Education, Fitness, Past/current experiences, Time since onset of injury/illness/exacerbation, and 3+ comorbidities: see PMH  are also affecting patient's functional outcome.   REHAB POTENTIAL: Good  CLINICAL DECISION MAKING: Evolving/moderate complexity  EVALUATION  COMPLEXITY: Moderate   GOALS: Goals reviewed with patient? Yes  SHORT TERM GOALS: Target date: 05/14/2023  Patient donnes prosthesis modified independent & verbalizes proper cleaning. Baseline: SEE OBJECTIVE DATA Goal status: Ongoing 04/28/2023 2.  Patient tolerates prosthesis >10 hrs total /day without skin issues or limb pain >5/10 after standing. Baseline: SEE OBJECTIVE DATA Goal status: Ongoing 04/28/2023  3.  Patient able to reach 7 and look over both shoulders without UE support with supervision. Baseline: SEE OBJECTIVE DATA Goal status: Ongoing 04/28/2023  4. Patient ambulates 200' with crutches & prosthesis with supervision / verbal cues only. Baseline: SEE OBJECTIVE DATA Goal status: Ongoing 04/28/2023  5. Patient negotiates ramps & curbs with crutches & prosthesis with supervision. Baseline: SEE OBJECTIVE DATA Goal status: Ongoing 04/28/2023  LONG TERM GOALS: Target date: 07/09/2023  Patient demonstrates & verbalized understanding of prosthetic care to enable safe utilization of prosthesis. Baseline: SEE OBJECTIVE DATA Goal status: Ongoing 04/28/2023  Patient tolerates prosthesis wear >90% of awake hours without skin or limb pain issues. Baseline: SEE OBJECTIVE DATA Goal status: Ongoing 04/28/2023  Berg Balance >36/56 to indicate lower fall risk Baseline: SEE OBJECTIVE DATA Goal status: Ongoing 04/28/2023  Patient ambulates >300' with prosthesis and cane or less independently Baseline: SEE OBJECTIVE DATA Goal status: Ongoing 04/28/2023  Patient negotiates ramps, curbs & stairs with single rail with prosthesis and cane independently. Baseline: SEE OBJECTIVE DATA Goal  status: Ongoing 04/28/2023    PLAN:  PT FREQUENCY: 2x/week  PT DURATION: 13 weeks / 90 days  PLANNED INTERVENTIONS: 97164- PT Re-evaluation, 97110-Therapeutic exercises, 97530- Therapeutic activity, 97112- Neuromuscular re-education, 97535- Self Care, [id number]- Manual therapy, [phone]- Gait  training, [phone]- Prosthetic training, Patient/Family education, Balance training, Stair training, Vestibular training, DME instructions, Therapeutic exercises, Therapeutic activity, Neuromuscular re-education, Gait training, and Self Care  PLAN FOR NEXT SESSION: continue prosthetic training for balance & gait including ramps & curbs.  Shamra Bradeen, PT, DPT 04/28/2023, 4:11 PM

## 2023-05-04 ENCOUNTER — Encounter: Payer: Medicare HMO | Admitting: Physical Therapy

## 2023-05-04 ENCOUNTER — Telehealth: Payer: Self-pay | Admitting: Physical Therapy

## 2023-05-04 NOTE — Telephone Encounter (Signed)
 Patient did not show for his PT appointment.  PT called and patient reports that he has a flat tire.  PT reminded him of next scheduled appointment.

## 2023-05-06 ENCOUNTER — Telehealth: Payer: Self-pay | Admitting: Physical Therapy

## 2023-05-06 ENCOUNTER — Encounter: Payer: Medicare HMO | Admitting: Physical Therapy

## 2023-05-06 NOTE — Telephone Encounter (Signed)
 He has flat tire that he can afford to fix.  He is aware of appt on Monday, 1/13.  PT explained policy and need to call to cancel.  Pt verbalized understanding.

## 2023-05-11 ENCOUNTER — Encounter: Payer: Self-pay | Admitting: Physical Therapy

## 2023-05-11 ENCOUNTER — Ambulatory Visit (INDEPENDENT_AMBULATORY_CARE_PROVIDER_SITE_OTHER): Payer: Medicare HMO | Admitting: Physical Therapy

## 2023-05-11 DIAGNOSIS — R2689 Other abnormalities of gait and mobility: Secondary | ICD-10-CM | POA: Diagnosis not present

## 2023-05-11 DIAGNOSIS — R2681 Unsteadiness on feet: Secondary | ICD-10-CM | POA: Diagnosis not present

## 2023-05-11 DIAGNOSIS — M6281 Muscle weakness (generalized): Secondary | ICD-10-CM

## 2023-05-11 DIAGNOSIS — R296 Repeated falls: Secondary | ICD-10-CM

## 2023-05-11 NOTE — Therapy (Signed)
 OUTPATIENT PHYSICAL THERAPY PROSTHETIC TREATMENT   Patient Name: Mark [name]. MRN: [id number] DOB:25-Sep-1955, 68 y.o., male Today's Date: 05/11/2023  PCP: Clotilda Single, MD REFERRING PROVIDER: Jerona Sage, MD  END OF SESSION:  PT End of Session - 05/11/23 1503     Visit Number 4    Number of Visits 25    Date for PT Re-Evaluation 07/09/23    Authorization Type [employer] Medicare HMO/PPO and [employer] PPO    Progress Note Due on Visit 10    PT Start Time 1430    PT Stop Time 1508    PT Time Calculation (min) 38 min    Equipment Utilized During Treatment Gait belt    Activity Tolerance Patient tolerated treatment well;Patient limited by fatigue    Behavior During Therapy WFL for tasks assessed/performed;Flat affect                 Past Medical History:  Diagnosis Date   Alcohol  abuse    Anemia    Arthritis    Ascites    Cirrhosis ([name])    Coffee ground emesis    Dehydration 06/17/2017   Febrile illness    GERD (gastroesophageal reflux disease)    Heart murmur    History of blood transfusion    Hyperlipidemia    Hypertension    Leg swelling    Myocardial infarction ([name]) 2012   Preop cardiovascular exam 04/14/2013   Sepsis ([name]) 06/17/2017   Septic shock ([name]) 06/18/2017   SIRS (systemic inflammatory response syndrome) ([name]) 07/11/2017   Stroke ([name]) 10/2009   TIA   Thrombocytopenia ([name])    Past Surgical History:  Procedure Laterality Date   AMPUTATION Right 11/07/2022   Procedure: RIGHT ABOVE KNEE AMPUTATION;  Surgeon: Sage Jerona GAILS, MD;  Location: [facility] OR;  Service: Orthopedics;  Laterality: Right;   COLONOSCOPY WITH PROPOFOL  N/A 02/07/2016   Procedure: COLONOSCOPY WITH PROPOFOL ;  Surgeon: Toribio SHAUNNA Cedar, MD;  Location: [facility];  Service: Endoscopy;  Laterality: N/A;   ERCP N/A 02/28/2023   Procedure: ENDOSCOPIC RETROGRADE CHOLANGIOPANCREATOGRAPHY (ERCP);  Surgeon: Rollin Dover, MD;  Location: [facility];  Service:  Gastroenterology;  Laterality: N/A;   ESOPHAGOGASTRODUODENOSCOPY (EGD) WITH PROPOFOL  N/A 02/07/2016   Procedure: ESOPHAGOGASTRODUODENOSCOPY (EGD) WITH PROPOFOL ;  Surgeon: Toribio SHAUNNA Cedar, MD;  Location: [facility];  Service: Endoscopy;  Laterality: N/A;   ESOPHAGOGASTRODUODENOSCOPY (EGD) WITH PROPOFOL  N/A 06/22/2017   Procedure: ESOPHAGOGASTRODUODENOSCOPY (EGD) WITH PROPOFOL ;  Surgeon: Albertus Gordy HERO, MD;  Location: [facility] END[facility]COPY;  Service: Gastroenterology;  Laterality: N/A;   EXCISIONAL TOTAL KNEE ARTHROPLASTY WITH ANTIBIOTIC SPACERS Right 03/20/2020   Procedure: Resection right total knee arthroplasty and placement of antibiotic spacer;  Surgeon: Ernie Cough, MD;  Location: [facility];  Service: Orthopedics;  Laterality: Right;  90 mins   HERNIA REPAIR Right    inguinal   INGUINAL HERNIA REPAIR Left 02/08/2021   Procedure: OPEN LEFT INGUINAL HERNIA REPAIR WITH MESH;  Surgeon: Vernetta Berg, MD;  Location: [facility];  Service: General;  Laterality: Left;   IRRIGATION AND DEBRIDEMENT KNEE Right 11/13/2020   Procedure: IRRIGATION AND DEBRIDEMENT KNEE;  Surgeon: Ernie Cough, MD;  Location: [facility];  Service: Orthopedics;  Laterality: Right;   JOINT REPLACEMENT     KNEE ARTHROSCOPY     bilateral/  12/14   REIMPLANTATION OF TOTAL KNEE Right 08/02/2020   Procedure: REIMPLANTATION/REVISION OF TOTAL KNEE WITH REMOVAL OF ANTIBIOTIC SPACER;  Surgeon: Ernie Cough, MD;  Location: [facility];  Service: Orthopedics;  Laterality:  Right;    REMOVAL OF STONES  02/28/2023   Procedure: REMOVAL OF DEBRIS;  Surgeon: Rollin Dover, MD;  Location: [facility];  Service: Gastroenterology;;   ANNETT  02/28/2023   Procedure: ANNETT;  Surgeon: Rollin Dover, MD;  Location: [facility];  Service: Gastroenterology;;   TEE WITHOUT CARDIOVERSION N/A 05/13/2018   Procedure: TRANSESOPHAGEAL ECHOCARDIOGRAM (TEE);  Surgeon: Loni Soyla LABOR, MD;  Location: [facility] ENDOSCOPY;  Service: Cardiovascular;   Laterality: N/A;   TOTAL KNEE ARTHROPLASTY Right 05/02/2013   Procedure: RIGHT TOTAL KNEE ARTHROPLASTY;  Surgeon: Donnice JONETTA Car, MD;  Location: [facility];  Service: Orthopedics;  Laterality: Right;   Patient Active Problem List   Diagnosis Date Noted   Acute blood loss anemia 03/02/2023   Choledocholithiasis 02/28/2023   HTN (hypertension) 02/28/2023   Hypokalemia 02/28/2023   Cholelithiasis with biliary obstruction 02/27/2023   Infection of total knee replacement ([name]) 11/07/2022   Hx of AKA (above knee amputation), right ([name]) 11/07/2022   Cerebrovascular disease 07/08/2022   Renal cyst 07/08/2022   Protein-calorie malnutrition, severe 07/08/2022   Toxic metabolic encephalopathy 02/26/2022   Septic joint of right knee joint ([name]) 11/08/2020   S/P right TKA reimplantation 08/02/2020   Infection of total left knee replacement ([name]) 03/20/2020   Cirrhosis of liver without ascites ([name])    AKI (acute kidney injury) ([name])    Bacteremia    Spinal stenosis of lumbar region without neurogenic claudication    Aspiration pneumonia ([name]) 09/24/2018   Abscess in epidural space of lumbar spine    MRSA bacteremia 09/21/2018   Infection of prosthetic right knee joint ([name]) 09/20/2018   Anemia of chronic disease 09/20/2018   Hypoalbuminemia 09/20/2018   Hypoglycemia without diagnosis of diabetes mellitus 09/20/2018   Epidural abscess 09/20/2018   Hyperkalemia 05/18/2018   Edentulous 05/11/2018   Pancytopenia ([name]) 05/10/2018   CAD (coronary artery disease) 05/10/2018   Hepatic encephalopathy ([name]) 05/10/2018   Alcohol  abuse 05/10/2018   GERD (gastroesophageal reflux disease) 05/10/2018   Duodenal ulcer    Renal failure    Acute on chronic anemia    Hypotension 06/17/2017   Hyponatremia 06/17/2017   Leg edema, right 06/17/2017   Acute metabolic encephalopathy 06/17/2017   Anemia, iron  deficiency    Benign neoplasm of ascending colon    Hemorrhoids    Portal hypertensive gastropathy  ([name])    Gastritis and gastroduodenitis    Esophageal varices without bleeding ([name])    H/O: CVA (cerebrovascular accident) 12/01/2013   ACS (acute coronary syndrome) ([name]) 12/01/2013   Anasarca 12/01/2013   Polysubstance abuse ([name]) 12/01/2013   CKD (chronic kidney disease) stage 3, GFR 30-59 ml/min ([name]) 12/01/2013   S/P right TKA 05/02/2013   Murmur 04/14/2013   Right inguinal hernia 12/26/2010   Cirrhosis, alcoholic ([name]) 12/20/2010    ONSET DATE: 02/10/2023 prosthesis delivery  REFERRING DIAG: Z89.611 (ICD-10-CM) - Hx of AKA (above knee amputation), right   THERAPY DIAG:  Other abnormalities of gait and mobility  Unsteadiness on feet  Repeated falls  Muscle weakness (generalized)  Rationale for Evaluation and Treatment: Rehabilitation  SUBJECTIVE:   SUBJECTIVE STATEMENT: He relays he still has some pain and soreness in right residual limb dating back 3 months ago from when he fell. He feels his prosthesis is too long for him making it more difficult and painful to walk PERTINENT HISTORY: right TFA 11/07/22, inguinal hernia repair BLEs, ETOH abuse, cirrhosis, arthritis, GERD, HTN, HLD, heart murmur, MI, TIA 2011, systemic inflammatory response syndrome  PAIN:  Are you having pain? Yes: NPRS scale: today  6/10 Pain location: right residual limb more distal lateral Pain description: sharp Aggravating factors: unknown Relieving factors:  pain pill, sit to rest  PRECAUTIONS: Fall  WEIGHT BEARING RESTRICTIONS: No  FALLS: Has patient fallen in last 6 months? Yes. Number of falls 3 times no injuries. Off balance  LIVING ENVIRONMENT: Lives with: lives with their spouse and dog 60#  Lives in: House Home Access: Stairs to enter Home layout: One level Stairs: Yes: External: 2 steps to porch without rails and threshold into house Has following equipment at home: Single point cane, Environmental Consultant - 2 wheeled, Chief Operating Officer, Wheelchair (manual), and Tour manager  OCCUPATION:   on  disability. 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   OBJECTIVE:  COGNITION: Evaluation on 04/13/2023:  pt appeared high and was slow answering questions.  Evaluation on 02/16/2023:  Overall cognitive status: Within functional limits for tasks assessed  POSTURE: Evaluation on 02/16/2023: rounded shoulders, forward head, flexed trunk , and weight shift left  LOWER EXTREMITY ROM:  ROM P:passive  A:active Right Eval 02/16/23  Hip flexion   Hip extension Thomas -9*  Hip abduction   Hip adduction   Hip internal rotation   Hip external rotation   Knee flexion   Knee extension   Ankle dorsiflexion   Ankle plantarflexion   Ankle inversion   Ankle eversion    (Blank rows = not tested)  LOWER EXTREMITY MMT:  MMT Right Eval 02/16/23 Left Eval 02/16/23  Hip flexion 4/5 5/5  Hip extension 3/5 4/5  Hip abduction 3+/5 4/5  Hip adduction    Hip internal rotation    Hip external rotation    Knee flexion  5/5  Knee extension  5/5  Ankle dorsiflexion  5/5  Ankle plantarflexion    Ankle inversion    Ankle eversion    At Evaluation all strength testing is grossly seated and functionally standing / gait. (Blank rows = not tested)  BED MOBILITY:  Evaluation on 02/16/2023:  Modified independent supine <> sit  TRANSFERS: 04/13/2023:  sit to stand pulling up on //bars with supervision.  Pt unable to control prosthetic knee.   03/30/2023: Sit to stand: SBA requires use of armrests from stable chair and external support for stabilization Stand to sit: SBA requires use or armrests on stable chair and sturdy chair.   FUNCTIONAL TESTs:  04/15/2023:  patient requires minA for static balance 60 seconds.    GAIT: 04/15/2023: Gait  pattern: step to pattern, decreased step length- Left, decreased stance time- Right, decreased hip/knee flexion- Right, circumduction- Right, Right hip hike, antalgic, trendelenburg, lateral hip instability, trunk flexed, abducted- Right, and poor foot clearance- Right POOR weight shift onto prosthesis in stance,  Distance walked: 100' Assistive device utilized: Crutches and TFA prosthesis Level of assistance: SBA with crutches   03/30/2023: Gait pattern: step to pattern, decreased step length- Left, decreased stance time- Right, decreased hip/knee flexion- Right, circumduction- Right, Right hip hike, antalgic, trendelenburg, lateral hip instability, trunk flexed, abducted- Right, and poor foot clearance- Right Distance walked: 100' Assistive device utilized: Crutches and TFA prosthesis Level of assistance: SBA with crutches Comments: pt amb 3' with HHA maxA   CURRENT PROSTHETIC WEAR ASSESSMENT: Evaluation on 02/16/2023:  Patient  is dependent with: skin check, residual limb care, care of non-amputated limb, prosthetic cleaning, ply sock cleaning, correct ply sock adjustment, proper wear schedule/adjustment, and proper weight-bearing schedule/adjustment Donning prosthesis: SBA Doffing prosthesis: SBA Prosthetic wear tolerance: 2-8 hours, 1x/day5 of 6 days since delivery Prosthetic weight bearing tolerance: 5 minutes with limb pain 7/10 Edema: pitting edema Residual limb condition: no open areas, normal color, temperature & moisture, scar is mobile with slight invagination. Prosthetic description: silicon liner with velcro landyard, ischial containment socket with flexible inner liner, multi-axial hydraulic knee, dynamic response foot K code/activity level with prosthetic use: Level 3    TODAY'S TREATMENT:                                                                                                                             DATE:  05/11/2023: Prosthetic Training with Transfemoral  Prosthesis:  PT observed him during standing and ambulation with axillary crutches and it is possible that Rt prosthesis is too long or he is not down in socket all the way, (hard for me to tell exactly) any way did provide him with 1/4 inch heel lift to try in his left shoe and he relays this feels a lot better during ambulation. Ambulated 100 feet X 3 with axillary crutches and CGA PT demo & verbal cues on technique to negotiate curb and ramp.  Pt neg 6.5 curb & 12* incline with axillary crutches with minA and tactile / verbal cues.  PT demo and verbal cues on how to move crutches over to one side to free up other arm to use for arm rest assist for sit to stands. Practiced this 2 times each side with good return demonstration  Therex Nu step L7 X 6 min UE/LE for functional endurance    TREATMENT:                                                                                                                             DATE:  04/28/2023: Prosthetic Training with Transfemoral Prosthesis:  PT reviewed verbally and demo on tightening suspension strap in standing with weight on prosthesis & neutral pelvis.  PT recommended tightening in morning when donning prosthesis and checking strap throughout day primarily in bathroom while he would have his pants down so the strap is accessible.  PT also reminded him to fold sock over proximal socket to minimize having the sock bunch up inside the  socket.  Pt verbalized understanding.  Pt amb 100' X 2 supervision / cues with axillary crutches 3-point pattern with PT tactile and verbal cues on step length, weight shift over prosthesis in stance and prosthetic knee flexion in terminal stance to advance prosthesis.  PT demo & verbal cues on sit to/from stand 18 chairs without armrests using LUE on seat. Pt able to return demo with verbal cues.  PT demo & verbal cues on technique to negotiate curb and ramp.  Pt neg 6.5 curb & 12* incline with axillary crutches with  minA and tactile / verbal cues.     TREATMENT:                                                                                                                             DATE:  04/15/2023: Prosthetic Training with Transfemoral Prosthesis:  PT demo & verbal cues on donning liner with proper rotation to velcro suspension strap, folding sock over proximal socket to limit sock bunching up inside socket and donning prosthesis with slight toe out with weight on prosthesis tightening suspension strap.  PT recommended checking & tightening suspension strap every time that he is the bathroom and only tightening in standing with weight on prosthesis.  Pt verbalized understanding.  PT added 1/4 heel lift to left shoe to compensate for pt perception of prosthesis too long.  PT adjusted pt's crutch handles which improved their use.  PT demo & verbal cues 3 point pattern with prosthetic foot directly bw crutch tips.  Weight shift over prosthesis in stance prior to stepping LLE.  Pt amb 50' & 100' with axillary crutches with tactile & verbal cues on carryover of above.   Standing balance at sink with PT manual assist to maintain prosthetic knee extension & tactile / verbal cues on weight shift: BUE support shifting left/midline/right with cues to use socket pressure for proprioception, shifting back heel presssure / midline/ forward toe pressure with cues to use socket pressure for proprioception, weight midline reaching over head with single UE support and looking over shoulders with single UE support.     HOME EXERCISE PROGRAM:  ASSESSMENT:  CLINICAL IMPRESSION: Prosthesis may be a little long or he is not far enough down in the socket (hard for PT to tell) so did provide him with 1/4 inch heel lift which he says feels much better during ambulation. He was encouraged to reach out to hanger to see if prosthesis needs to be adjusted.  Patient benefits from skilled PT to improve function & safety.    OBJECTIVE  IMPAIRMENTS: Abnormal gait, decreased activity tolerance, decreased balance, decreased endurance, decreased knowledge of condition, decreased knowledge of use of DME, decreased mobility, difficulty walking, decreased ROM, decreased strength, decreased safety awareness, postural dysfunction, prosthetic dependency , and pain.   ACTIVITY LIMITATIONS: carrying, lifting, standing, stairs, transfers, and locomotion level  PARTICIPATION LIMITATIONS: meal prep, cleaning, driving, community activity, occupation, and yard work  PERSONAL FACTORS: Education,  Fitness, Past/current experiences, Time since onset of injury/illness/exacerbation, and 3+ comorbidities: see PMH  are also affecting patient's functional outcome.   REHAB POTENTIAL: Good  CLINICAL DECISION MAKING: Evolving/moderate complexity  EVALUATION COMPLEXITY: Moderate   GOALS: Goals reviewed with patient? Yes  SHORT TERM GOALS: Target date: 05/14/2023  Patient donnes prosthesis modified independent & verbalizes proper cleaning. Baseline: SEE OBJECTIVE DATA Goal status: Ongoing 05/04/2023 2.  Patient tolerates prosthesis >10 hrs total /day without skin issues or limb pain >5/10 after standing. Baseline: SEE OBJECTIVE DATA Goal status: Ongoing 05/04/2023  3.  Patient able to reach 7 and look over both shoulders without UE support with supervision. Baseline: SEE OBJECTIVE DATA Goal status: Ongoing 05/04/2023  4. Patient ambulates 200' with crutches & prosthesis with supervision / verbal cues only. Baseline: SEE OBJECTIVE DATA Goal status: Ongoing 05/04/2023  5. Patient negotiates ramps & curbs with crutches & prosthesis with supervision. Baseline: SEE OBJECTIVE DATA Goal status: Ongoing 05/04/2023  LONG TERM GOALS: Target date: 07/09/2023  Patient demonstrates & verbalized understanding of prosthetic care to enable safe utilization of prosthesis. Baseline: SEE OBJECTIVE DATA Goal status: Ongoing 05/04/2023  Patient tolerates prosthesis  wear >90% of awake hours without skin or limb pain issues. Baseline: SEE OBJECTIVE DATA Goal status: Ongoing 05/04/2023  Berg Balance >36/56 to indicate lower fall risk Baseline: SEE OBJECTIVE DATA Goal status: Ongoing 05/04/2023  Patient ambulates >300' with prosthesis and cane or less independently Baseline: SEE OBJECTIVE DATA Goal status: Ongoing 05/04/2023  Patient negotiates ramps, curbs & stairs with single rail with prosthesis and cane independently. Baseline: SEE OBJECTIVE DATA Goal status: Ongoing  05/04/2023    PLAN:  PT FREQUENCY: 2x/week  PT DURATION: 13 weeks / 90 days  PLANNED INTERVENTIONS: 97164- PT Re-evaluation, 97110-Therapeutic exercises, 97530- Therapeutic activity, 97112- Neuromuscular re-education, 97535- Self Care, [id number]- Manual therapy, [phone]- Gait training, [phone]- Prosthetic training, Patient/Family education, Balance training, Stair training, Vestibular training, DME instructions, Therapeutic exercises, Therapeutic activity, Neuromuscular re-education, Gait training, and Self Care  PLAN FOR NEXT SESSION: check height of prosthesis and how he did with heel lift.  continue prosthetic training for balance & gait including ramps & curbs.  Redell JONELLE Moose, PT, DPT 05/11/2023, 3:04 PM

## 2023-05-13 ENCOUNTER — Encounter: Payer: Self-pay | Admitting: Physical Therapy

## 2023-05-13 ENCOUNTER — Ambulatory Visit (INDEPENDENT_AMBULATORY_CARE_PROVIDER_SITE_OTHER): Payer: Medicare HMO | Admitting: Physical Therapy

## 2023-05-13 DIAGNOSIS — R2681 Unsteadiness on feet: Secondary | ICD-10-CM

## 2023-05-13 DIAGNOSIS — R2689 Other abnormalities of gait and mobility: Secondary | ICD-10-CM | POA: Diagnosis not present

## 2023-05-13 DIAGNOSIS — R296 Repeated falls: Secondary | ICD-10-CM

## 2023-05-13 DIAGNOSIS — M25651 Stiffness of right hip, not elsewhere classified: Secondary | ICD-10-CM | POA: Diagnosis not present

## 2023-05-13 DIAGNOSIS — M6281 Muscle weakness (generalized): Secondary | ICD-10-CM

## 2023-05-13 NOTE — Therapy (Signed)
 OUTPATIENT PHYSICAL THERAPY PROSTHETIC TREATMENT   Patient Name: Mark [name]. MRN: [id number] DOB:[birth year], 68 y.o., male Today's Date: 05/13/2023  PCP: Cinda Craze, MD REFERRING PROVIDER: Gearldean Keepers, MD  END OF SESSION:  PT End of Session - 05/13/23 1429     Visit Number 5    Number of Visits 25    Date for PT Re-Evaluation 07/09/23    Authorization Type [employer] Medicare HMO/PPO and [employer] PPO    Progress Note Due on Visit 10    PT Start Time 1429    PT Stop Time 1507    PT Time Calculation (min) 38 min    Equipment Utilized During Treatment Gait belt    Activity Tolerance Patient tolerated treatment well;Patient limited by fatigue    Behavior During Therapy WFL for tasks assessed/performed;Flat affect                  Past Medical History:  Diagnosis Date   Alcohol  abuse    Anemia    Arthritis    Ascites    Cirrhosis ([name])    Coffee ground emesis    Dehydration 06/17/2017   Febrile illness    GERD (gastroesophageal reflux disease)    Heart murmur    History of blood transfusion    Hyperlipidemia    Hypertension    Leg swelling    Myocardial infarction ([name]) 2012   Preop cardiovascular exam 04/14/2013   Sepsis ([name]) 06/17/2017   Septic shock ([name]) 06/18/2017   SIRS (systemic inflammatory response syndrome) ([name]) 07/11/2017   Stroke ([name]) 10/2009   TIA   Thrombocytopenia ([name])    Past Surgical History:  Procedure Laterality Date   AMPUTATION Right 11/07/2022   Procedure: RIGHT ABOVE KNEE AMPUTATION;  Surgeon: Timothy Ford, MD;  Location: [facility] OR;  Service: Orthopedics;  Laterality: Right;   COLONOSCOPY WITH PROPOFOL  N/A 02/07/2016   Procedure: COLONOSCOPY WITH PROPOFOL ;  Surgeon: Janel Medford, MD;  Location: [facility];  Service: Endoscopy;  Laterality: N/A;   ERCP N/A 02/28/2023   Procedure: ENDOSCOPIC RETROGRADE CHOLANGIOPANCREATOGRAPHY (ERCP);  Surgeon: Alvis Jourdain, MD;  Location: [facility];  Service:  Gastroenterology;  Laterality: N/A;   ESOPHAGOGASTRODUODENOSCOPY (EGD) WITH PROPOFOL  N/A 02/07/2016   Procedure: ESOPHAGOGASTRODUODENOSCOPY (EGD) WITH PROPOFOL ;  Surgeon: Janel Medford, MD;  Location: [facility];  Service: Endoscopy;  Laterality: N/A;   ESOPHAGOGASTRODUODENOSCOPY (EGD) WITH PROPOFOL  N/A 06/22/2017   Procedure: ESOPHAGOGASTRODUODENOSCOPY (EGD) WITH PROPOFOL ;  Surgeon: Nannette Babe, MD;  Location: [facility] ENDOSCOPY;  Service: Gastroenterology;  Laterality: N/A;   EXCISIONAL TOTAL KNEE ARTHROPLASTY WITH ANTIBIOTIC SPACERS Right 03/20/2020   Procedure: Resection right total knee arthroplasty and placement of antibiotic spacer;  Surgeon: Claiborne Crew, MD;  Location: [facility] ORS;  Service: Orthopedics;  Laterality: Right;  90 mins   HERNIA REPAIR Right    inguinal   INGUINAL HERNIA REPAIR Left 02/08/2021   Procedure: OPEN LEFT INGUINAL HERNIA REPAIR WITH MESH;  Surgeon: Oza Blumenthal, MD;  Location: [facility] OR;  Service: General;  Laterality: Left;   IRRIGATION AND DEBRIDEMENT KNEE Right 11/13/2020   Procedure: IRRIGATION AND DEBRIDEMENT KNEE;  Surgeon: Claiborne Crew, MD;  Location: [facility];  Service: Orthopedics;  Laterality: Right;   JOINT REPLACEMENT     KNEE ARTHROSCOPY     bilateral/  12/14   REIMPLANTATION OF TOTAL KNEE Right 08/02/2020   Procedure: REIMPLANTATION/REVISION OF TOTAL KNEE WITH REMOVAL OF ANTIBIOTIC SPACER;  Surgeon: Claiborne Crew, MD;  Location: [facility];  Service: Orthopedics;  Laterality: Right;    REMOVAL OF STONES  02/28/2023   Procedure: REMOVAL OF DEBRIS;  Surgeon: Alvis Jourdain, MD;  Location: [facility];  Service: Gastroenterology;;   [address]  02/28/2023   Procedure: [address];  Surgeon: Alvis Jourdain, MD;  Location: [facility];  Service: Gastroenterology;;   TEE WITHOUT CARDIOVERSION N/A 05/13/2018   Procedure: TRANSESOPHAGEAL ECHOCARDIOGRAM (TEE);  Surgeon: Euell Herrlich, MD;  Location: [facility] ENDOSCOPY;  Service: Cardiovascular;   Laterality: N/A;   TOTAL KNEE ARTHROPLASTY Right 05/02/2013   Procedure: RIGHT TOTAL KNEE ARTHROPLASTY;  Surgeon: Bevin Bucks, MD;  Location: [facility] ORS;  Service: Orthopedics;  Laterality: Right;   Patient Active Problem List   Diagnosis Date Noted   Acute blood loss anemia 03/02/2023   Choledocholithiasis 02/28/2023   HTN (hypertension) 02/28/2023   Hypokalemia 02/28/2023   Cholelithiasis with biliary obstruction 02/27/2023   Infection of total knee replacement ([name]) 11/07/2022   Hx of AKA (above knee amputation), right ([name]) 11/07/2022   Cerebrovascular disease 07/08/2022   Renal cyst 07/08/2022   Protein-calorie malnutrition, severe 07/08/2022   Toxic metabolic encephalopathy 02/26/2022   Septic joint of right knee joint ([name]) 11/08/2020   S/P right TKA reimplantation 08/02/2020   Infection of total left knee replacement ([name]) 03/20/2020   Cirrhosis of liver without ascites ([name])    AKI (acute kidney injury) ([name])    Bacteremia    Spinal stenosis of lumbar region without neurogenic claudication    Aspiration pneumonia ([name]) 09/24/2018   Abscess in epidural space of lumbar spine    MRSA bacteremia 09/21/2018   Infection of prosthetic right knee joint ([name]) 09/20/2018   Anemia of chronic disease 09/20/2018   Hypoalbuminemia 09/20/2018   Hypoglycemia without diagnosis of diabetes mellitus 09/20/2018   Epidural abscess 09/20/2018   Hyperkalemia 05/18/2018   Edentulous 05/11/2018   Pancytopenia ([name]) 05/10/2018   CAD (coronary artery disease) 05/10/2018   Hepatic encephalopathy ([name]) 05/10/2018   Alcohol  abuse 05/10/2018   GERD (gastroesophageal reflux disease) 05/10/2018   Duodenal ulcer    Renal failure    Acute on chronic anemia    Hypotension 06/17/2017   Hyponatremia 06/17/2017   Leg edema, right 06/17/2017   Acute metabolic encephalopathy 06/17/2017   Anemia, iron  deficiency    Benign neoplasm of ascending colon    Hemorrhoids    Portal hypertensive gastropathy  ([name])    Gastritis and gastroduodenitis    Esophageal varices without bleeding ([name])    H/O: CVA (cerebrovascular accident) 12/01/2013   ACS (acute coronary syndrome) ([name]) 12/01/2013   Anasarca 12/01/2013   Polysubstance abuse ([name]) 12/01/2013   CKD (chronic kidney disease) stage 3, GFR 30-59 ml/min ([name]) 12/01/2013   S/P right TKA 05/02/2013   Murmur 04/14/2013   Right inguinal hernia 12/26/2010   Cirrhosis, alcoholic ([name]) 12/20/2010    ONSET DATE: 02/10/2023 prosthesis delivery  REFERRING DIAG: Z89.611 (ICD-10-CM) - Hx of AKA (above knee amputation), right   THERAPY DIAG:  Other abnormalities of gait and mobility  Unsteadiness on feet  Repeated falls  Muscle weakness (generalized)  Stiffness of right hip, not elsewhere classified  Rationale for Evaluation and Treatment: Rehabilitation  SUBJECTIVE:   SUBJECTIVE STATEMENT: He only wears prosthesis when goes to doctor or therapy.  The foot is turning out.  Last time he saw prosthetist was middle of December.   PERTINENT HISTORY: right TFA 11/07/22, inguinal hernia repair BLEs, ETOH abuse, cirrhosis, arthritis, GERD, HTN, HLD, heart murmur, MI, TIA 2011, systemic inflammatory response syndrome  PAIN:  Are you having pain? Yes: NPRS scale: today  8/10 Pain location: right residual limb more distal lateral Pain description: sharp Aggravating factors: unknown Relieving factors:  pain pill, sit to rest  PRECAUTIONS: Fall  WEIGHT BEARING RESTRICTIONS: No  FALLS: Has patient fallen in last 6 months? Yes. Number of falls 3 times no injuries. Off balance  LIVING ENVIRONMENT: Lives with: lives with their spouse and dog 60#  Lives in: House Home Access: Stairs to enter Home layout: One level Stairs: Yes: External: 2 steps to porch without rails and threshold into house Has following equipment at home: Single point cane, Environmental consultant - 2 wheeled, Chief Operating Officer, Wheelchair (manual), and Tour manager  OCCUPATION:   on disability.  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   OBJECTIVE:  COGNITION: Evaluation on 04/13/2023:  pt appeared high and was slow answering questions.  Evaluation on 02/16/2023:  Overall cognitive status: Within functional limits for tasks assessed  POSTURE: Evaluation on 02/16/2023: rounded shoulders, forward head, flexed trunk , and weight shift left  LOWER EXTREMITY ROM:  ROM P:passive  A:active Right Eval 02/16/23  Hip flexion   Hip extension Thomas -9*  Hip abduction   Hip adduction   Hip internal rotation   Hip external rotation   Knee flexion   Knee extension   Ankle dorsiflexion   Ankle plantarflexion   Ankle inversion   Ankle eversion    (Blank rows = not tested)  LOWER EXTREMITY MMT:  MMT Right Eval 02/16/23 Left Eval 02/16/23  Hip flexion 4/5 5/5  Hip extension 3/5 4/5  Hip abduction 3+/5 4/5  Hip adduction    Hip internal rotation    Hip external rotation    Knee flexion  5/5  Knee extension  5/5  Ankle dorsiflexion  5/5  Ankle plantarflexion    Ankle inversion    Ankle eversion    At Evaluation all strength testing is grossly seated and functionally standing / gait. (Blank rows = not tested)  BED MOBILITY:  Evaluation on 02/16/2023:  Modified independent supine <> sit  TRANSFERS: 04/13/2023:  sit to stand pulling up on //bars with supervision.  Pt unable to control prosthetic knee.   03/30/2023: Sit to stand: SBA requires use of armrests from stable chair and external support for stabilization Stand to sit: SBA requires use or armrests on stable chair and sturdy chair.   FUNCTIONAL TESTs:  04/15/2023:  patient requires minA for static balance 60 seconds.    GAIT: 04/15/2023: Gait pattern: step to  pattern, decreased step length- Left, decreased stance time- Right, decreased hip/knee flexion- Right, circumduction- Right, Right hip hike, antalgic, trendelenburg, lateral hip instability, trunk flexed, abducted- Right, and poor foot clearance- Right POOR weight shift onto prosthesis in stance,  Distance walked: 100' Assistive device utilized: Crutches and TFA prosthesis Level of assistance: SBA with crutches   03/30/2023: Gait pattern: step to pattern, decreased step length- Left, decreased stance time- Right, decreased hip/knee flexion- Right, circumduction- Right, Right hip hike, antalgic, trendelenburg, lateral hip instability, trunk flexed, abducted- Right, and poor foot clearance- Right Distance walked: 100' Assistive device utilized: Crutches and TFA prosthesis Level of assistance: SBA with crutches Comments: pt amb 3' with HHA maxA   CURRENT PROSTHETIC WEAR ASSESSMENT: Evaluation on 02/16/2023:  Patient is dependent  with: skin check, residual limb care, care of non-amputated limb, prosthetic cleaning, ply sock cleaning, correct ply sock adjustment, proper wear schedule/adjustment, and proper weight-bearing schedule/adjustment Donning prosthesis: SBA Doffing prosthesis: SBA Prosthetic wear tolerance: 2-8 hours, 1x/day5 of 6 days since delivery Prosthetic weight bearing tolerance: 5 minutes with limb pain 7/10 Edema: pitting edema Residual limb condition: no open areas, normal color, temperature & moisture, scar is mobile with slight invagination. Prosthetic description: silicon liner with velcro landyard, ischial containment socket with flexible inner liner, multi-axial hydraulic knee, dynamic response foot K code/activity level with prosthetic use: Level 3    TODAY'S TREATMENT:                                                                                                                             DATE:  05/13/2023: Prosthetic Training with Transfemoral Prosthesis:  PT spoke  to Leetta Pulse, [facility] with concerns for socket rotation (may need pads) and options for more stable knee possibly option of manual lock.  Pt is scheduled to see him 1/16 at 1:00. PT reviewed donning with proper alignment of D-ring, foot slightly toe out with weight bearing, ASIS ("headlights") facing forward.  PT recommending support like counter or RW anteriorly.  Pt & wife verbalized better understanding.  Pt sit to/from stand w/c to crutches with PT cues on technique.  Pt amb 40' with cues on step length, step through pattern, initial contact with heel to facilitate knee stability and weight shift over prosthesis.     TREATMENT:                                                                                                                             DATE:  05/11/2023: Prosthetic Training with Transfemoral Prosthesis:  PT observed him during standing and ambulation with axillary crutches and it is possible that Rt prosthesis is too long or he is not down in socket all the way, (hard for me to tell exactly) any way did provide him with 1/4 inch heel lift to try in his left shoe and he relays this feels a lot better during ambulation. Ambulated 100 feet X 3 with axillary crutches and CGA PT demo & verbal cues on technique to negotiate curb and ramp.  Pt neg 6.5" curb & 12* incline with axillary crutches with minA and tactile / verbal cues.  PT demo and verbal cues on how to move crutches over  to one side to free up other arm to use for arm rest assist for sit to stands. Practiced this 2 times each side with good return demonstration  Therex Nu step L7 X 6 min UE/LE for functional endurance    TREATMENT:                                                                                                                             DATE:  04/28/2023: Prosthetic Training with Transfemoral Prosthesis:  PT reviewed verbally and demo on tightening suspension strap in standing with weight on prosthesis &  neutral pelvis.  PT recommended tightening in morning when donning prosthesis and checking strap throughout day primarily in bathroom while he would have his pants down so the strap is accessible.  PT also reminded him to fold sock over proximal socket to minimize having the sock bunch up inside the socket.  Pt verbalized understanding.  Pt amb 100' X 2 supervision / cues with axillary crutches 3-point pattern with PT tactile and verbal cues on step length, weight shift over prosthesis in stance and prosthetic knee flexion in terminal stance to advance prosthesis.  PT demo & verbal cues on sit to/from stand 18" chairs without armrests using LUE on seat. Pt able to return demo with verbal cues.  PT demo & verbal cues on technique to negotiate curb and ramp.  Pt neg 6.5" curb & 12* incline with axillary crutches with minA and tactile / verbal cues.     TREATMENT:                                                                                                                             DATE:  04/15/2023: Prosthetic Training with Transfemoral Prosthesis:  PT demo & verbal cues on donning liner with proper rotation to velcro suspension strap, folding sock over proximal socket to limit sock bunching up inside socket and donning prosthesis with slight toe out with weight on prosthesis tightening suspension strap.  PT recommended checking & tightening suspension strap every time that he is the bathroom and only tightening in standing with weight on prosthesis.  Pt verbalized understanding.  PT added 1/4" heel lift to left shoe to compensate for pt perception of prosthesis too long.  PT adjusted pt's crutch handles which improved their use.  PT demo & verbal cues 3 point pattern with prosthetic foot directly bw crutch tips.  Weight shift  over prosthesis in stance prior to stepping LLE.  Pt amb 50' & 100' with axillary crutches with tactile & verbal cues on carryover of above.   Standing balance at sink with PT  manual assist to maintain prosthetic knee extension & tactile / verbal cues on weight shift: BUE support shifting left/midline/right with cues to use socket pressure for proprioception, shifting back heel presssure / midline/ forward toe pressure with cues to use socket pressure for proprioception, weight midline reaching over head with single UE support and looking over shoulders with single UE support.     HOME EXERCISE PROGRAM:  ASSESSMENT:  CLINICAL IMPRESSION: Patient may benefit from more stable prosthetic knee and pads to tighten socket.  He verbalized better understanding for donning.   Patient benefits from skilled PT to improve function & safety.    OBJECTIVE IMPAIRMENTS: Abnormal gait, decreased activity tolerance, decreased balance, decreased endurance, decreased knowledge of condition, decreased knowledge of use of DME, decreased mobility, difficulty walking, decreased ROM, decreased strength, decreased safety awareness, postural dysfunction, prosthetic dependency , and pain.   ACTIVITY LIMITATIONS: carrying, lifting, standing, stairs, transfers, and locomotion level  PARTICIPATION LIMITATIONS: meal prep, cleaning, driving, community activity, occupation, and yard work  PERSONAL FACTORS: Education, Fitness, Past/current experiences, Time since onset of injury/illness/exacerbation, and 3+ comorbidities: see PMH  are also affecting patient's functional outcome.   REHAB POTENTIAL: Good  CLINICAL DECISION MAKING: Evolving/moderate complexity  EVALUATION COMPLEXITY: Moderate   GOALS: Goals reviewed with patient? Yes  SHORT TERM GOALS: Target date: 05/14/2023  Patient donnes prosthesis modified independent & verbalizes proper cleaning. Baseline: SEE OBJECTIVE DATA Goal status: Ongoing 05/04/2023 2.  Patient tolerates prosthesis >10 hrs total /day without skin issues or limb pain >5/10 after standing. Baseline: SEE OBJECTIVE DATA Goal status: Ongoing 05/04/2023  3.  Patient able  to reach 7" and look over both shoulders without UE support with supervision. Baseline: SEE OBJECTIVE DATA Goal status: Ongoing 05/04/2023  4. Patient ambulates 200' with crutches & prosthesis with supervision / verbal cues only. Baseline: SEE OBJECTIVE DATA Goal status: Ongoing 05/04/2023  5. Patient negotiates ramps & curbs with crutches & prosthesis with supervision. Baseline: SEE OBJECTIVE DATA Goal status: Ongoing 05/04/2023  LONG TERM GOALS: Target date: 07/09/2023  Patient demonstrates & verbalized understanding of prosthetic care to enable safe utilization of prosthesis. Baseline: SEE OBJECTIVE DATA Goal status: Ongoing 05/04/2023  Patient tolerates prosthesis wear >90% of awake hours without skin or limb pain issues. Baseline: SEE OBJECTIVE DATA Goal status: Ongoing 05/04/2023  Berg Balance >36/56 to indicate lower fall risk Baseline: SEE OBJECTIVE DATA Goal status: Ongoing 05/04/2023  Patient ambulates >300' with prosthesis and cane or less independently Baseline: SEE OBJECTIVE DATA Goal status: Ongoing 05/04/2023  Patient negotiates ramps, curbs & stairs with single rail with prosthesis and cane independently. Baseline: SEE OBJECTIVE DATA Goal status: Ongoing  05/04/2023    PLAN:  PT FREQUENCY: 2x/week  PT DURATION: 13 weeks / 90 days  PLANNED INTERVENTIONS: 97164- PT Re-evaluation, 97110-Therapeutic exercises, 97530- Therapeutic activity, 97112- Neuromuscular re-education, 97535- Self Care, [id number]- Manual therapy, [phone]- Gait training, [phone]- Prosthetic training, Patient/Family education, Balance training, Stair training, Vestibular training, DME instructions, Therapeutic exercises, Therapeutic activity, Neuromuscular re-education, Gait training, and Self Care  PLAN FOR NEXT SESSION: check STGs,   continue prosthetic training for balance & gait including ramps & curbs.  Chastelyn Athens, PT, DPT 05/13/2023, 4:46 PM

## 2023-05-18 ENCOUNTER — Ambulatory Visit (INDEPENDENT_AMBULATORY_CARE_PROVIDER_SITE_OTHER): Payer: Medicare HMO | Admitting: Physical Therapy

## 2023-05-18 ENCOUNTER — Encounter: Payer: Self-pay | Admitting: Physical Therapy

## 2023-05-18 DIAGNOSIS — R2681 Unsteadiness on feet: Secondary | ICD-10-CM

## 2023-05-18 DIAGNOSIS — M25651 Stiffness of right hip, not elsewhere classified: Secondary | ICD-10-CM | POA: Diagnosis not present

## 2023-05-18 DIAGNOSIS — M6281 Muscle weakness (generalized): Secondary | ICD-10-CM | POA: Diagnosis not present

## 2023-05-18 DIAGNOSIS — R2689 Other abnormalities of gait and mobility: Secondary | ICD-10-CM | POA: Diagnosis not present

## 2023-05-18 DIAGNOSIS — R296 Repeated falls: Secondary | ICD-10-CM | POA: Diagnosis not present

## 2023-05-18 NOTE — Therapy (Signed)
OUTPATIENT PHYSICAL THERAPY PROSTHETIC TREATMENT   Patient Name: Mark [name]. MRN: [id number] DOB:1955-06-27, 68 y.o., male 31 Date: 05/18/2023  PCP: Abbe Amsterdam, MD REFERRING PROVIDER: Aldean Baker, MD  END OF SESSION:  PT End of Session - 05/18/23 1445     Visit Number 6    Number of Visits 25    Date for PT Re-Evaluation 07/09/23    Authorization Type [employer] Medicare HMO/PPO and [employer] PPO    Progress Note Due on Visit 10    PT Start Time 1443    PT Stop Time 1521    PT Time Calculation (min) 38 min    Equipment Utilized During Treatment Gait belt    Activity Tolerance Patient tolerated treatment well;Patient limited by fatigue    Behavior During Therapy [facility] for tasks assessed/performed;Flat affect                   Past Medical History:  Diagnosis Date   Alcohol abuse    Anemia    Arthritis    Ascites    Cirrhosis ([name])    Coffee ground emesis    Dehydration 06/17/2017   Febrile illness    GERD (gastroesophageal reflux disease)    Heart murmur    History of blood transfusion    Hyperlipidemia    Hypertension    Leg swelling    Myocardial infarction ([name]) 2012   Preop cardiovascular exam 04/14/2013   Sepsis ([name]) 06/17/2017   Septic shock ([name]) 06/18/2017   SIRS (systemic inflammatory response syndrome) ([name]) 07/11/2017   Stroke ([name]) 10/2009   TIA   Thrombocytopenia ([name])    Past Surgical History:  Procedure Laterality Date   AMPUTATION Right 11/07/2022   Procedure: RIGHT ABOVE KNEE AMPUTATION;  Surgeon: Nadara Mustard, MD;  Location: [facility] OR;  Service: Orthopedics;  Laterality: Right;   COLONOSCOPY WITH PROPOFOL N/A 02/07/2016   Procedure: COLONOSCOPY WITH PROPOFOL;  Surgeon: Rachael Fee, MD;  Location: [facility] ENDOSCOPY;  Service: Endoscopy;  Laterality: N/A;   ERCP N/A 02/28/2023   Procedure: ENDOSCOPIC RETROGRADE CHOLANGIOPANCREATOGRAPHY (ERCP);  Surgeon: Jeani Hawking, MD;  Location: [facility];  Service:  Gastroenterology;  Laterality: N/A;   ESOPHAGOGASTRODUODENOSCOPY (EGD) WITH PROPOFOL N/A 02/07/2016   Procedure: ESOPHAGOGASTRODUODENOSCOPY (EGD) WITH PROPOFOL;  Surgeon: Rachael Fee, MD;  Location: [facility]OS[facility];  Service: Endoscopy;  Laterality: N/A;   ESOPHAGOGASTRODUODENOSCOPY (EGD) WITH PROPOFOL N/A 06/22/2017   Procedure: ESOPHAGOGASTRODUODENOSCOPY (EGD) WITH PROPOFOL;  Surgeon: Beverley Fiedler, MD;  Location: [facility] ENDOSCOPY;  Service: Gastroenterology;  Laterality: N/A;   EXCISIONAL TOTAL KNEE ARTHROPLASTY WITH ANTIBIOTIC SPACERS Right 03/20/2020   Procedure: Resection right total knee arthroplasty and placement of antibiotic spacer;  Surgeon: Durene Romans, MD;  Location: [facility] ORS;  Service: Orthopedics;  Laterality: Right;  90 mins   HERNIA REPAIR Right    inguinal   INGUINAL HERNIA REPAIR Left 02/08/2021   Procedure: OPEN LEFT INGUINAL HERNIA REPAIR WITH MESH;  Surgeon: Abigail Miyamoto, MD;  Location: [facility] OR;  Service: General;  Laterality: Left;   IRRIGATION AND DEBRIDEMENT KNEE Right 11/13/2020   Procedure: IRRIGATION AND DEBRIDEMENT KNEE;  Surgeon: Durene Romans, MD;  Location: [facility] ORS;  Service: Orthopedics;  Laterality: Right;   JOINT REPLACEMENT     KNEE ARTHROSCOPY     bilateral/  12/14   REIMPLANTATION OF TOTAL KNEE Right 08/02/2020   Procedure: REIMPLANTATION/REVISION OF TOTAL KNEE WITH REMOVAL OF ANTIBIOTIC SPACER;  Surgeon: Durene Romans, MD;  Location: [facility] OR[facility];  Service: Orthopedics;  Laterality: Right;    REMOVAL OF STONES  02/28/2023   Procedure: REMOVAL OF DEBRIS;  Surgeon: Jeani Hawking, MD;  Location: [facility];  Service: Gastroenterology;;   Dennison Mascot  02/28/2023   Procedure: Dennison Mascot;  Surgeon: Jeani Hawking, MD;  Location: [facility];  Service: Gastroenterology;;   TEE WITHOUT CARDIOVERSION N/A 05/13/2018   Procedure: TRANSESOPHAGEAL ECHOCARDIOGRAM (TEE);  Surgeon: Parke Poisson, MD;  Location: [facility] ENDOSCOPY;  Service: Cardiovascular;   Laterality: N/A;   TOTAL KNEE ARTHROPLASTY Right 05/02/2013   Procedure: RIGHT TOTAL KNEE ARTHROPLASTY;  Surgeon: Shelda Pal, MD;  Location: [facility] OR[facility];  Service: Orthopedics;  Laterality: Right;   Patient Active Problem List   Diagnosis Date Noted   Acute blood loss anemia 03/02/2023   Choledocholithiasis 02/28/2023   HTN (hypertension) 02/28/2023   Hypokalemia 02/28/2023   Cholelithiasis with biliary obstruction 02/27/2023   Infection of total knee replacement ([name]) 11/07/2022   Hx of AKA (above knee amputation), right ([name]) 11/07/2022   Cerebrovascular disease 07/08/2022   Renal cyst 07/08/2022   Protein-calorie malnutrition, severe 07/08/2022   Toxic metabolic encephalopathy 02/26/2022   Septic joint of right knee joint ([name]) 11/08/2020   S/P right TKA reimplantation 08/02/2020   Infection of total left knee replacement ([name]) 03/20/2020   Cirrhosis of liver without ascites ([name])    AKI (acute kidney injury) ([name])    Bacteremia    Spinal stenosis of lumbar region without neurogenic claudication    Aspiration pneumonia ([name]) 09/24/2018   Abscess in epidural space of lumbar spine    MRSA bacteremia 09/21/2018   Infection of prosthetic right knee joint ([name]) 09/20/2018   Anemia of chronic disease 09/20/2018   Hypoalbuminemia 09/20/2018   Hypoglycemia without diagnosis of diabetes mellitus 09/20/2018   Epidural abscess 09/20/2018   Hyperkalemia 05/18/2018   Edentulous 05/11/2018   Pancytopenia ([name]) 05/10/2018   CAD (coronary artery disease) 05/10/2018   Hepatic encephalopathy ([name]) 05/10/2018   Alcohol abuse 05/10/2018   GERD (gastroesophageal reflux disease) 05/10/2018   Duodenal ulcer    Renal failure    Acute on chronic anemia    Hypotension 06/17/2017   Hyponatremia 06/17/2017   Leg edema, right 06/17/2017   Acute metabolic encephalopathy 06/17/2017   Anemia, iron deficiency    Benign neoplasm of ascending colon    Hemorrhoids    Portal hypertensive gastropathy  ([name])    Gastritis and gastroduodenitis    Esophageal varices without bleeding ([name])    H/O: CVA (cerebrovascular accident) 12/01/2013   ACS (acute coronary syndrome) ([name]) 12/01/2013   Anasarca 12/01/2013   Polysubstance abuse ([name]) 12/01/2013   CKD (chronic kidney disease) stage 3, GFR 30-59 ml/min ([name]) 12/01/2013   S/P right TKA 05/02/2013   Murmur 04/14/2013   Right inguinal hernia 12/26/2010   Cirrhosis, alcoholic ([name]) 12/20/2010    ONSET DATE: 02/10/2023 prosthesis delivery  REFERRING DIAG: Z89.611 (ICD-10-CM) - Hx of AKA (above knee amputation), right   THERAPY DIAG:  Other abnormalities of gait and mobility  Unsteadiness on feet  Repeated falls  Muscle weakness (generalized)  Stiffness of right hip, not elsewhere classified  Rationale for Evaluation and Treatment: Rehabilitation  SUBJECTIVE:   SUBJECTIVE STATEMENT: He saw prosthetist last week and he adjusted the knee & foot.  Patient feels that the prosthesis is more stable.  He has been putting prosthesis on limb making sure his headlights are straight and checks strap during the day.   PERTINENT HISTORY: right TFA 11/07/22, inguinal hernia repair BLEs, ETOH abuse, cirrhosis, arthritis,  GERD, HTN, HLD, heart murmur, MI, TIA 2011, systemic inflammatory response syndrome  PAIN:  Are you having pain? Yes: NPRS scale: today  0/10 since last PT up to 8/10 Pain location: right residual limb more distal lateral Pain description: sharp Aggravating factors: unknown Relieving factors:  pain pill, sit to rest  PRECAUTIONS: Fall  WEIGHT BEARING RESTRICTIONS: No  FALLS: Has patient fallen in last 6 months? Yes. Number of falls 3 times no injuries. Off balance  LIVING ENVIRONMENT: Lives with: lives with their spouse and dog 60#  Lives in: House Home Access: Stairs to enter Home layout: One level Stairs: Yes: External: 2 steps to porch without rails and threshold into house Has following equipment at home: Single  point cane, Environmental consultant - 2 wheeled, Chief Operating Officer, Wheelchair (manual), and Tour manager  OCCUPATION:   on disability. 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   OBJECTIVE:  COGNITION: Evaluation on 04/13/2023:  pt appeared high and was slow answering questions.  Evaluation on 02/16/2023:  Overall cognitive status: Within functional limits for tasks assessed  POSTURE: Evaluation on 02/16/2023: rounded shoulders, forward head, flexed trunk , and weight shift left  LOWER EXTREMITY ROM:  ROM P:passive  A:active Right Eval 02/16/23  Hip flexion   Hip extension Thomas -9*  Hip abduction   Hip adduction   Hip internal rotation   Hip external rotation   Knee flexion   Knee extension   Ankle dorsiflexion   Ankle plantarflexion   Ankle inversion   Ankle eversion    (Blank rows = not tested)  LOWER EXTREMITY MMT:  MMT Right Eval 02/16/23 Left Eval 02/16/23  Hip flexion 4/5 5/5  Hip extension 3/5 4/5  Hip abduction 3+/5 4/5  Hip adduction    Hip internal rotation    Hip external rotation    Knee flexion  5/5  Knee extension  5/5  Ankle dorsiflexion  5/5  Ankle plantarflexion    Ankle inversion    Ankle eversion    At Evaluation all strength testing is grossly seated and functionally standing / gait. (Blank rows = not tested)  BED MOBILITY:  Evaluation on 02/16/2023:  Modified independent supine <> sit  TRANSFERS: 04/13/2023:  sit to stand pulling up on //bars with supervision.  Pt unable to control prosthetic knee.   03/30/2023: Sit to stand: SBA requires use of armrests from stable chair and external support for stabilization Stand to sit: SBA requires use or armrests on stable chair and sturdy chair.   FUNCTIONAL TESTs:   05/18/2023:  Standing at sink with one hand hovering for safety: looking over shoulders to right & left and reaching forward 7" to tap back wall.  PT cued verbal & tactile on equal weight bearing.    04/15/2023:  patient requires minA for static balance 60 seconds.    GAIT: 05/18/2023: Pt amb 100' with axillary crutches with supervision / verbal cues for pelvic orientation and upright posture.  Prosthetic knee was stable in stance with changes from prosthetist.   Pt neg 12* incline with crutches with supervision with difficulty weight shifting over prosthesis in stance. PT demo option of side stepping on ramp or incline if too steep.  Pt able to side step on 12* incline with supervision and pt reports easier.  Pt neg 6.5" curb with axillary crutches with supervision.    04/15/2023: Gait pattern: step to pattern, decreased step length- Left, decreased stance time- Right, decreased hip/knee flexion- Right, circumduction- Right, Right hip hike, antalgic, trendelenburg, lateral hip instability, trunk flexed, abducted- Right, and poor foot clearance- Right POOR weight shift onto prosthesis in stance,  Distance walked: 100' Assistive device utilized: Crutches and TFA prosthesis Level of assistance: SBA with crutches   03/30/2023: Gait pattern: step to pattern, decreased step length- Left, decreased stance time- Right, decreased hip/knee flexion- Right, circumduction- Right, Right hip hike, antalgic, trendelenburg, lateral hip instability, trunk flexed, abducted- Right, and poor foot clearance- Right Distance walked: 100' Assistive device utilized: Crutches and TFA prosthesis Level of assistance: SBA with crutches Comments: pt amb 3' with HHA maxA   CURRENT PROSTHETIC WEAR ASSESSMENT: 05/18/2023:  pt verbalizes proper donning & cleaning of prosthesis. Pt reports wear >10 hours without skin issues.    Evaluation on 02/16/2023:  Patient is dependent with: skin check, residual limb care, care of  non-amputated limb, prosthetic cleaning, ply sock cleaning, correct ply sock adjustment, proper wear schedule/adjustment, and proper weight-bearing schedule/adjustment Donning prosthesis: SBA Doffing prosthesis: SBA Prosthetic wear tolerance: 2-8 hours, 1x/day5 of 6 days since delivery Prosthetic weight bearing tolerance: 5 minutes with limb pain 7/10 Edema: pitting edema Residual limb condition: no open areas, normal color, temperature & moisture, scar is mobile with slight invagination. Prosthetic description: silicon liner with velcro landyard, ischial containment socket with flexible inner liner, multi-axial hydraulic knee, dynamic response foot K code/activity level with prosthetic use: Level 3    TODAY'S TREATMENT:                                                                                                                             DATE:  05/18/2023: Prosthetic Training with Transfemoral Prosthesis:  Pt sit to/from stand chairs without armrests using UE to crutches with verbal cues.   Pt amb 50' and 100' with axillary crutches with supervision / verbal cues for pelvic orientation and upright posture.  Prosthetic knee was stable in stance with changes from prosthetist.   Pt neg 12* incline with crutches with supervision with difficulty weight shifting over prosthesis in stance. PT demo option of side stepping on ramp or incline if too steep.  Pt able to side step on 12* incline with supervision and pt reports easier.   Pt neg 6.5" curb with axillary crutches with supervision.   Standing at sink with one hand hovering for safety: looking over shoulders to right & left and reaching forward 7" to tap back wall.  PT cued verbal & tactile on equal weight bearing.      TREATMENT:  DATE:  05/13/2023: Prosthetic Training with Transfemoral Prosthesis:  PT spoke to Carolyne Fiscal, [name] with concerns for socket rotation (may need pads) and options for more stable knee possibly option of manual lock.  Pt is scheduled to see him 1/16 at 1:00. PT reviewed donning with proper alignment of D-ring, foot slightly toe out with weight bearing, ASIS ("headlights") facing forward.  PT recommending support like counter or RW anteriorly.  Pt & wife verbalized better understanding.  Pt sit to/from stand w/c to crutches with PT cues on technique.  Pt amb 40' with cues on step length, step through pattern, initial contact with heel to facilitate knee stability and weight shift over prosthesis.     TREATMENT:                                                                                                                             DATE:  05/11/2023: Prosthetic Training with Transfemoral Prosthesis:  PT observed him during standing and ambulation with axillary crutches and it is possible that Rt prosthesis is too long or he is not down in socket all the way, (hard for me to tell exactly) any way did provide him with 1/4 inch heel lift to try in his left shoe and he relays this feels a lot better during ambulation. Ambulated 100 feet X 3 with axillary crutches and CGA PT demo & verbal cues on technique to negotiate curb and ramp.  Pt neg 6.5" curb & 12* incline with axillary crutches with minA and tactile / verbal cues.  PT demo and verbal cues on how to move crutches over to one side to free up other arm to use for arm rest assist for sit to stands. Practiced this 2 times each side with good return demonstration  Therex Nu step L7 X 6 min UE/LE for functional endurance    HOME EXERCISE PROGRAM:  ASSESSMENT:  CLINICAL IMPRESSION: Patient met all STGs.  His gait appears safer with improved stability after changes that prosthetist made.   Patient benefits from skilled PT to improve function & safety.    OBJECTIVE IMPAIRMENTS: Abnormal gait, decreased activity tolerance, decreased  balance, decreased endurance, decreased knowledge of condition, decreased knowledge of use of DME, decreased mobility, difficulty walking, decreased ROM, decreased strength, decreased safety awareness, postural dysfunction, prosthetic dependency , and pain.   ACTIVITY LIMITATIONS: carrying, lifting, standing, stairs, transfers, and locomotion level  PARTICIPATION LIMITATIONS: meal prep, cleaning, driving, community activity, occupation, and yard work  PERSONAL FACTORS: Education, Fitness, Past/current experiences, Time since onset of injury/illness/exacerbation, and 3+ comorbidities: see PMH  are also affecting patient's functional outcome.   REHAB POTENTIAL: Good  CLINICAL DECISION MAKING: Evolving/moderate complexity  EVALUATION COMPLEXITY: Moderate   GOALS: Goals reviewed with patient? Yes  SHORT TERM GOALS: Target date: 05/14/2023  Patient donnes prosthesis modified independent & verbalizes proper cleaning. Baseline: SEE OBJECTIVE DATA Goal status: MET 05/18/2023 2.  Patient tolerates prosthesis >10 hrs total /day without  skin issues or limb pain >5/10 after standing. Baseline: SEE OBJECTIVE DATA Goal status: MET 05/18/2023  3.  Patient able to reach 7" and look over both shoulders without UE support with supervision. Baseline: SEE OBJECTIVE DATA Goal status: MET 05/18/2023  4. Patient ambulates 200' with crutches & prosthesis with supervision / verbal cues only. Baseline: SEE OBJECTIVE DATA Goal status: MET 05/18/2023  5. Patient negotiates ramps & curbs with crutches & prosthesis with supervision. Baseline: SEE OBJECTIVE DATA Goal status: MET 05/18/2023  LONG TERM GOALS: Target date: 07/09/2023  Patient demonstrates & verbalized understanding of prosthetic care to enable safe utilization of prosthesis. Baseline: SEE OBJECTIVE DATA Goal status: Ongoing 05/04/2023  Patient tolerates prosthesis wear >90% of awake hours without skin or limb pain issues. Baseline: SEE OBJECTIVE  DATA Goal status: Ongoing 05/04/2023  Berg Balance >36/56 to indicate lower fall risk Baseline: SEE OBJECTIVE DATA Goal status: Ongoing 05/04/2023  Patient ambulates >300' with prosthesis and cane or less independently Baseline: SEE OBJECTIVE DATA Goal status: Ongoing 05/04/2023  Patient negotiates ramps, curbs & stairs with single rail with prosthesis and cane independently. Baseline: SEE OBJECTIVE DATA Goal status: Ongoing  05/04/2023    PLAN:  PT FREQUENCY: 2x/week  PT DURATION: 13 weeks / 90 days  PLANNED INTERVENTIONS: 97164- PT Re-evaluation, 97110-Therapeutic exercises, 97530- Therapeutic activity, 97112- Neuromuscular re-education, 97535- Self Care, [id number]- Manual therapy, [phone]- Gait training, [phone]- Prosthetic training, Patient/Family education, Balance training, Stair training, Vestibular training, DME instructions, Therapeutic exercises, Therapeutic activity, Neuromuscular re-education, Gait training, and Self Care  PLAN FOR NEXT SESSION: introduce stairs, continue prosthetic training for balance & gait including ramps & curbs.  Vladimir Faster, PT, DPT 05/18/2023, 5:14 PM

## 2023-05-20 ENCOUNTER — Encounter: Payer: Medicare HMO | Admitting: Physical Therapy

## 2023-05-21 ENCOUNTER — Encounter: Payer: Medicare HMO | Admitting: Physical Therapy

## 2023-05-21 ENCOUNTER — Telehealth: Payer: Self-pay | Admitting: Physical Therapy

## 2023-05-21 NOTE — Telephone Encounter (Signed)
Pt did not show for PT appointment today. They were contacted and I spoke with his wife who states he was not feeling well and he was sleeping so not going to make it in. They were instructed to call us to let us know if they cannot make their appointment.  Ivery Quale, PT, DPT 05/21/23 2:08 PM

## 2023-06-01 ENCOUNTER — Encounter: Payer: Self-pay | Admitting: Physical Therapy

## 2023-06-01 ENCOUNTER — Ambulatory Visit (INDEPENDENT_AMBULATORY_CARE_PROVIDER_SITE_OTHER): Payer: Medicare HMO | Admitting: Physical Therapy

## 2023-06-01 DIAGNOSIS — M25651 Stiffness of right hip, not elsewhere classified: Secondary | ICD-10-CM

## 2023-06-01 DIAGNOSIS — R2681 Unsteadiness on feet: Secondary | ICD-10-CM

## 2023-06-01 DIAGNOSIS — M6281 Muscle weakness (generalized): Secondary | ICD-10-CM

## 2023-06-01 DIAGNOSIS — R2689 Other abnormalities of gait and mobility: Secondary | ICD-10-CM

## 2023-06-01 DIAGNOSIS — R296 Repeated falls: Secondary | ICD-10-CM | POA: Diagnosis not present

## 2023-06-01 NOTE — Therapy (Signed)
OUTPATIENT PHYSICAL THERAPY PROSTHETIC TREATMENT   Patient Name: Mark [name]. MRN: [id number] DOB:11/18/1955, 68 y.o., male 35 Date: 06/01/2023  PCP: Abbe Amsterdam, MD REFERRING PROVIDER: Aldean Baker, MD  END OF SESSION:  PT End of Session - 06/01/23 1446     Visit Number 7    Number of Visits 25    Date for PT Re-Evaluation 07/09/23    Authorization Type [employer] Medicare HMO/PPO and [employer] PPO    Progress Note Due on Visit 10    PT Start Time 1445    PT Stop Time 1518    PT Time Calculation (min) 33 min    Equipment Utilized During Treatment Gait belt    Activity Tolerance Patient tolerated treatment well;Patient limited by fatigue    Behavior During Therapy [facility] for tasks assessed/performed;Flat affect                    Past Medical History:  Diagnosis Date   Alcohol abuse    Anemia    Arthritis    Ascites    Cirrhosis ([name])    Coffee ground emesis    Dehydration 06/17/2017   Febrile illness    GERD (gastroesophageal reflux disease)    Heart murmur    History of blood transfusion    Hyperlipidemia    Hypertension    Leg swelling    Myocardial infarction ([name]) 2012   Preop cardiovascular exam 04/14/2013   Sepsis ([name]) 06/17/2017   Septic shock ([name]) 06/18/2017   SIRS (systemic inflammatory response syndrome) ([name]) 07/11/2017   Stroke ([name]) 10/2009   TIA   Thrombocytopenia ([name])    Past Surgical History:  Procedure Laterality Date   AMPUTATION Right 11/07/2022   Procedure: RIGHT ABOVE KNEE AMPUTATION;  Surgeon: Nadara Mustard, MD;  Location: [facility] OR;  Service: Orthopedics;  Laterality: Right;   COLONOSCOPY WITH PROPOFOL N/A 02/07/2016   Procedure: COLONOSCOPY WITH PROPOFOL;  Surgeon: Rachael Fee, MD;  Location: [facility] ENDOSCOPY;  Service: Endoscopy;  Laterality: N/A;   ERCP N/A 02/28/2023   Procedure: ENDOSCOPIC RETROGRADE CHOLANGIOPANCREATOGRAPHY (ERCP);  Surgeon: Jeani Hawking, MD;  Location: [facility] ENDOSCOPY;  Service:  Gastroenterology;  Laterality: N/A;   ESOPHAGOGASTRODUODENOSCOPY (EGD) WITH PROPOFOL N/A 02/07/2016   Procedure: ESOPHAGOGASTRODUODENOSCOPY (EGD) WITH PROPOFOL;  Surgeon: Rachael Fee, MD;  Location: [facility] ENDOSCOPY;  Service: Endoscopy;  Laterality: N/A;   ESOPHAGOGASTRODUODENOSCOPY (EGD) WITH PROPOFOL N/A 06/22/2017   Procedure: ESOPHAGOGASTRODUODENOSCOPY (EGD) WITH PROPOFOL;  Surgeon: Beverley Fiedler, MD;  Location: [facility] ENDOSCOPY;  Service: Gastroenterology;  Laterality: N/A;   EXCISIONAL TOTAL KNEE ARTHROPLASTY WITH ANTIBIOTIC SPACERS Right 03/20/2020   Procedure: Resection right total knee arthroplasty and placement of antibiotic spacer;  Surgeon: Durene Romans, MD;  Location: [facility] ORS;  Service: Orthopedics;  Laterality: Right;  90 mins   HERNIA REPAIR Right    inguinal   INGUINAL HERNIA REPAIR Left 02/08/2021   Procedure: OPEN LEFT INGUINAL HERNIA REPAIR WITH MESH;  Surgeon: Abigail Miyamoto, MD;  Location[facility] OR;  Service: General;  Laterality: Left;   IRRIGATION AND DEBRIDEMENT KNEE Right 11/13/2020   Procedure: IRRIGATION AND DEBRIDEMENT KNEE;  Surgeon: Durene Romans, MD;  Location: [facility] ORS;  Service: Orthopedics;  Laterality: Right;   JOINT REPLACEMENT     KNEE ARTHROSCOPY     bilateral/  12/14   REIMPLANTATION OF TOTAL KNEE Right 08/02/2020   Procedure: REIMPLANTATION/REVISION OF TOTAL KNEE WITH REMOVAL OF ANTIBIOTIC SPACER;  Surgeon: Durene Romans, MD;  Location: [facility] ORS;  Service:  Orthopedics;  Laterality: Right;    REMOVAL OF STONES  02/28/2023   Procedure: REMOVAL OF DEBRIS;  Surgeon: Jeani Hawking, MD;  Location: [facility] ENDOSCOPY;  Service: Gastroenterology;;   Dennison Mascot  02/28/2023   Procedure: Dennison Mascot;  Surgeon: Jeani Hawking, MD;  Location: [facility] ENDOSCOPY;  Service: Gastroenterology;;   TEE WITHOUT CARDIOVERSION N/A 05/13/2018   Procedure: TRANSESOPHAGEAL ECHOCARDIOGRAM (TEE);  Surgeon: Parke Poisson, MD;  Location: [facility] ENDOSCOPY;  Service: Cardiovascular;   Laterality: N/A;   TOTAL KNEE ARTHROPLASTY Right 05/02/2013   Procedure: RIGHT TOTAL KNEE ARTHROPLASTY;  Surgeon: Shelda Pal, MD;  Location: [facility] ORS;  Service: Orthopedics;  Laterality: Right;   Patient Active Problem List   Diagnosis Date Noted   Acute blood loss anemia 03/02/2023   Choledocholithiasis 02/28/2023   HTN (hypertension) 02/28/2023   Hypokalemia 02/28/2023   Cholelithiasis with biliary obstruction 02/27/2023   Infection of total knee replacement ([name]) 11/07/2022   Hx of AKA (above knee amputation), right ([name]) 11/07/2022   Cerebrovascular disease 07/08/2022   Renal cyst 07/08/2022   Protein-calorie malnutrition, severe 07/08/2022   Toxic metabolic encephalopathy 02/26/2022   Septic joint of right knee joint ([name]) 11/08/2020   S/P right TKA reimplantation 08/02/2020   Infection of total left knee replacement ([name]) 03/20/2020   Cirrhosis of liver without ascites ([name])    AKI (acute kidney injury) ([name])    Bacteremia    Spinal stenosis of lumbar region without neurogenic claudication    Aspiration pneumonia ([name]) 09/24/2018   Abscess in epidural space of lumbar spine    MRSA bacteremia 09/21/2018   Infection of prosthetic right knee joint ([name]) 09/20/2018   Anemia of chronic disease 09/20/2018   Hypoalbuminemia 09/20/2018   Hypoglycemia without diagnosis of diabetes mellitus 09/20/2018   Epidural abscess 09/20/2018   Hyperkalemia 05/18/2018   Edentulous 05/11/2018   Pancytopenia ([name]) 05/10/2018   CAD (coronary artery disease) 05/10/2018   Hepatic encephalopathy ([name]) 05/10/2018   Alcohol abuse 05/10/2018   GERD (gastroesophageal reflux disease) 05/10/2018   Duodenal ulcer    Renal failure    Acute on chronic anemia    Hypotension 06/17/2017   Hyponatremia 06/17/2017   Leg edema, right 06/17/2017   Acute metabolic encephalopathy 06/17/2017   Anemia, iron deficiency    Benign neoplasm of ascending colon    Hemorrhoids    Portal hypertensive gastropathy  ([name])    Gastritis and gastroduodenitis    Esophageal varices without bleeding ([name])    H/O: CVA (cerebrovascular accident) 12/01/2013   ACS (acute coronary syndrome) ([name]) 12/01/2013   Anasarca 12/01/2013   Polysubstance abuse ([name]) 12/01/2013   CKD (chronic kidney disease) stage 3, GFR 30-59 ml/min ([name]) 12/01/2013   S/P right TKA 05/02/2013   Murmur 04/14/2013   Right inguinal hernia 12/26/2010   Cirrhosis, alcoholic ([name]) 12/20/2010    ONSET DATE: 02/10/2023 prosthesis delivery  REFERRING DIAG: Z89.611 (ICD-10-CM) - Hx of AKA (above knee amputation), right   THERAPY DIAG:  Other abnormalities of gait and mobility  Repeated falls  Unsteadiness on feet  Muscle weakness (generalized)  Stiffness of right hip, not elsewhere classified  Rationale for Evaluation and Treatment: Rehabilitation  SUBJECTIVE:   SUBJECTIVE STATEMENT: The prosthesis continues to turn.  He reports tighten strap the way that the PT advised.  Pt reports that distal limb hurts from fall on shuttle bus to [facility].   PERTINENT HISTORY: right TFA 11/07/22, inguinal hernia repair BLEs, ETOH abuse, cirrhosis, arthritis, GERD, HTN, HLD, heart murmur, MI, TIA 2011, systemic  inflammatory response syndrome  PAIN:  Are you having pain? Yes: NPRS scale: today 6/10 since last PT 4/10 to 8/10 Pain location: right residual limb more distal lateral Pain description: sharp Aggravating factors: unknown Relieving factors:  pain pill, sit to rest  PRECAUTIONS: Fall  WEIGHT BEARING RESTRICTIONS: No  FALLS: Has patient fallen in last 6 months? Yes. Number of falls 3 times no injuries. Off balance  LIVING ENVIRONMENT: Lives with: lives with their spouse and dog 60#  Lives in: House Home Access: Stairs to enter Home layout: One level Stairs: Yes: External: 2 steps to porch without rails and threshold into house Has following equipment at home: Single point cane, Environmental consultant - 2 wheeled, Chief Operating Officer, Wheelchair (manual), and  Tour manager  OCCUPATION:   on disability. 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   OBJECTIVE:  COGNITION: Evaluation on 04/13/2023:  pt appeared high and was slow answering questions.  Evaluation on 02/16/2023:  Overall cognitive status: Within functional limits for tasks assessed  POSTURE: Evaluation on 02/16/2023: rounded shoulders, forward head, flexed trunk , and weight shift left  LOWER EXTREMITY ROM:  ROM P:passive  A:active Right Eval 02/16/23  Hip flexion   Hip extension Thomas -9*  Hip abduction   Hip adduction   Hip internal rotation   Hip external rotation   Knee flexion   Knee extension   Ankle dorsiflexion   Ankle plantarflexion   Ankle inversion   Ankle eversion    (Blank rows = not tested)  LOWER EXTREMITY MMT:  MMT Right Eval 02/16/23 Left Eval 02/16/23  Hip flexion 4/5 5/5  Hip extension 3/5 4/5  Hip abduction 3+/5 4/5  Hip adduction    Hip internal rotation    Hip external rotation    Knee flexion  5/5  Knee extension  5/5  Ankle dorsiflexion  5/5  Ankle plantarflexion    Ankle inversion    Ankle eversion    At Evaluation all strength testing is grossly seated and functionally standing / gait. (Blank rows = not tested)  BED MOBILITY:  Evaluation on 02/16/2023:  Modified independent supine <> sit  TRANSFERS: 04/13/2023:  sit to stand pulling up on //bars with supervision.  Pt unable to control prosthetic knee.   03/30/2023: Sit to stand: SBA requires use of armrests from stable chair and external support for stabilization Stand to sit: SBA requires use or armrests on stable chair and sturdy chair.   FUNCTIONAL TESTs:  05/18/2023:  Standing at sink with one hand hovering for safety:  looking over shoulders to right & left and reaching forward 7" to tap back wall.  PT cued verbal & tactile on equal weight bearing.    04/15/2023:  patient requires minA for static balance 60 seconds.    GAIT: 05/18/2023: Pt amb 100' with axillary crutches with supervision / verbal cues for pelvic orientation and upright posture.  Prosthetic knee was stable in stance with changes from prosthetist.   Pt neg 12* incline with crutches with supervision with difficulty weight shifting over prosthesis in stance. PT demo option of side stepping on ramp or incline if too steep.  Pt able to side step on 12* incline with supervision and pt reports easier.   Pt neg 6.5" curb with axillary crutches with supervision.  04/15/2023: Gait pattern: step to pattern, decreased step length- Left, decreased stance time- Right, decreased hip/knee flexion- Right, circumduction- Right, Right hip hike, antalgic, trendelenburg, lateral hip instability, trunk flexed, abducted- Right, and poor foot clearance- Right POOR weight shift onto prosthesis in stance,  Distance walked: 100' Assistive device utilized: Crutches and TFA prosthesis Level of assistance: SBA with crutches   03/30/2023: Gait pattern: step to pattern, decreased step length- Left, decreased stance time- Right, decreased hip/knee flexion- Right, circumduction- Right, Right hip hike, antalgic, trendelenburg, lateral hip instability, trunk flexed, abducted- Right, and poor foot clearance- Right Distance walked: 100' Assistive device utilized: Crutches and TFA prosthesis Level of assistance: SBA with crutches Comments: pt amb 3' with HHA maxA   CURRENT PROSTHETIC WEAR ASSESSMENT: 05/18/2023:  pt verbalizes proper donning & cleaning of prosthesis. Pt reports wear >10 hours without skin issues.    Evaluation on 02/16/2023:  Patient is dependent with: skin check, residual limb care, care of non-amputated limb, prosthetic cleaning, ply sock cleaning, correct  ply sock adjustment, proper wear schedule/adjustment, and proper weight-bearing schedule/adjustment Donning prosthesis: SBA Doffing prosthesis: SBA Prosthetic wear tolerance: 2-8 hours, 1x/day5 of 6 days since delivery Prosthetic weight bearing tolerance: 5 minutes with limb pain 7/10 Edema: pitting edema Residual limb condition: no open areas, normal color, temperature & moisture, scar is mobile with slight invagination. Prosthetic description: silicon liner with velcro landyard, ischial containment socket with flexible inner liner, multi-axial hydraulic knee, dynamic response foot K code/activity level with prosthetic use: Level 3    TODAY'S TREATMENT:                                                                                                                             DATE:  06/01/2023: Prosthetic Training with Transfemoral Prosthesis:  Pt arrived to PT with severely flexed trunk, too long prosthetic step so poor weight shift over prosthesis.  Pt amb 100' with axillary crutches with 3-point pattern with PT cues to look forward (not staring at floor) to facilitate upright posture with weight shift over prosthesis in stance.    Neuromuscular Re-education: Stationary balance with posterior pelvis to counter with BUEs on counter - working on equal weight bearing with proper posture.  PT advised to try to stand like this 5 min 3-5 times a day.  Pt verbalized understanding.  Standing with minA / tactile cues for upright posture UE resistance green theraband alternating UEs & BUEs 5 reps ea - row, forward reach and upward reach.   Standing in corner with chair in front for safety: hip width stance on floor - eyes open 5 reps ea head motions right/left & up/down;  static eyes closed up to 5 sec 3 reps.     TREATMENT:  DATE:  05/18/2023: Prosthetic Training with  Transfemoral Prosthesis:  Pt sit to/from stand chairs without armrests using UE to crutches with verbal cues.   Pt amb 50' and 100' with axillary crutches with supervision / verbal cues for pelvic orientation and upright posture.  Prosthetic knee was stable in stance with changes from prosthetist.   Pt neg 12* incline with crutches with supervision with difficulty weight shifting over prosthesis in stance. PT demo option of side stepping on ramp or incline if too steep.  Pt able to side step on 12* incline with supervision and pt reports easier.   Pt neg 6.5" curb with axillary crutches with supervision.   Standing at sink with one hand hovering for safety: looking over shoulders to right & left and reaching forward 7" to tap back wall.  PT cued verbal & tactile on equal weight bearing.      TREATMENT:                                                                                                                             DATE:  05/13/2023: Prosthetic Training with Transfemoral Prosthesis:  PT spoke to Carolyne Fiscal, [facility] with concerns for socket rotation (may need pads) and options for more stable knee possibly option of manual lock.  Pt is scheduled to see him 1/16 at 1:00. PT reviewed donning with proper alignment of D-ring, foot slightly toe out with weight bearing, ASIS ("headlights") facing forward.  PT recommending support like counter or RW anteriorly.  Pt & wife verbalized better understanding.  Pt sit to/from stand w/c to crutches with PT cues on technique.  Pt amb 40' with cues on step length, step through pattern, initial contact with heel to facilitate knee stability and weight shift over prosthesis.     HOME EXERCISE PROGRAM:  ASSESSMENT:  CLINICAL IMPRESSION: Patient improved balance & posture with PT instruction and focus on task.  He improved gait when focused.    Patient benefits from skilled PT to improve function & safety.    OBJECTIVE IMPAIRMENTS: Abnormal gait, decreased  activity tolerance, decreased balance, decreased endurance, decreased knowledge of condition, decreased knowledge of use of DME, decreased mobility, difficulty walking, decreased ROM, decreased strength, decreased safety awareness, postural dysfunction, prosthetic dependency , and pain.   ACTIVITY LIMITATIONS: carrying, lifting, standing, stairs, transfers, and locomotion level  PARTICIPATION LIMITATIONS: meal prep, cleaning, driving, community activity, occupation, and yard work  PERSONAL FACTORS: Education, Fitness, Past/current experiences, Time since onset of injury/illness/exacerbation, and 3+ comorbidities: see PMH  are also affecting patient's functional outcome.   REHAB POTENTIAL: Good  CLINICAL DECISION MAKING: Evolving/moderate complexity  EVALUATION COMPLEXITY: Moderate   GOALS: Goals reviewed with patient? Yes  SHORT TERM GOALS: Target date: 05/14/2023  Patient donnes prosthesis modified independent & verbalizes proper cleaning. Baseline: SEE OBJECTIVE DATA Goal status: MET 05/18/2023 2.  Patient tolerates prosthesis >10 hrs total /day without skin issues or limb pain >5/10 after standing. Baseline: SEE OBJECTIVE  DATA Goal status: MET 05/18/2023  3.  Patient able to reach 7" and look over both shoulders without UE support with supervision. Baseline: SEE OBJECTIVE DATA Goal status: MET 05/18/2023  4. Patient ambulates 200' with crutches & prosthesis with supervision / verbal cues only. Baseline: SEE OBJECTIVE DATA Goal status: MET 05/18/2023  5. Patient negotiates ramps & curbs with crutches & prosthesis with supervision. Baseline: SEE OBJECTIVE DATA Goal status: MET 05/18/2023  LONG TERM GOALS: Target date: 07/09/2023  Patient demonstrates & verbalized understanding of prosthetic care to enable safe utilization of prosthesis. Baseline: SEE OBJECTIVE DATA Goal status: Ongoing 05/04/2023  Patient tolerates prosthesis wear >90% of awake hours without skin or limb pain  issues. Baseline: SEE OBJECTIVE DATA Goal status: Ongoing 05/04/2023  Berg Balance >36/56 to indicate lower fall risk Baseline: SEE OBJECTIVE DATA Goal status: Ongoing 05/04/2023  Patient ambulates >300' with prosthesis and cane or less independently Baseline: SEE OBJECTIVE DATA Goal status: Ongoing 05/04/2023  Patient negotiates ramps, curbs & stairs with single rail with prosthesis and cane independently. Baseline: SEE OBJECTIVE DATA Goal status: Ongoing  05/04/2023    PLAN:  PT FREQUENCY: 2x/week  PT DURATION: 13 weeks / 90 days  PLANNED INTERVENTIONS: 97164- PT Re-evaluation, 97110-Therapeutic exercises, 97530- Therapeutic activity, 97112- Neuromuscular re-education, 97535- Self Care, [id number]- Manual therapy, [phone]- Gait training, [phone]- Prosthetic training, Patient/Family education, Balance training, Stair training, Vestibular training, DME instructions, Therapeutic exercises, Therapeutic activity, Neuromuscular re-education, Gait training, and Self Care  PLAN FOR NEXT SESSION:   introduce stairs, continue prosthetic training for balance & gait including ramps & curbs.  Vladimir Faster, PT, DPT 06/01/2023, 4:40 PM

## 2023-06-03 ENCOUNTER — Encounter: Payer: Medicare HMO | Admitting: Physical Therapy

## 2023-06-09 ENCOUNTER — Encounter: Payer: Self-pay | Admitting: Physical Therapy

## 2023-06-09 ENCOUNTER — Ambulatory Visit (INDEPENDENT_AMBULATORY_CARE_PROVIDER_SITE_OTHER): Payer: Medicare HMO | Admitting: Physical Therapy

## 2023-06-09 DIAGNOSIS — R2689 Other abnormalities of gait and mobility: Secondary | ICD-10-CM | POA: Diagnosis not present

## 2023-06-09 DIAGNOSIS — R296 Repeated falls: Secondary | ICD-10-CM

## 2023-06-09 DIAGNOSIS — R2681 Unsteadiness on feet: Secondary | ICD-10-CM

## 2023-06-09 DIAGNOSIS — M25651 Stiffness of right hip, not elsewhere classified: Secondary | ICD-10-CM

## 2023-06-09 DIAGNOSIS — M6281 Muscle weakness (generalized): Secondary | ICD-10-CM

## 2023-06-09 NOTE — Therapy (Signed)
OUTPATIENT PHYSICAL THERAPY PROSTHETIC TREATMENT   Patient Name: Mark [name]. MRN: [id number] DOB:[birth year], 68 y.o., male Today's Date: 06/09/2023  PCP: Abbe Amsterdam, MD REFERRING PROVIDER: Aldean Baker, MD  END OF SESSION:  PT End of Session - 06/09/23 1446     Visit Number 8    Number of Visits 25    Date for PT Re-Evaluation 07/09/23    Authorization Type [employer] Medicare HMO/PPO and [employer] PPO    Progress Note Due on Visit 10    PT Start Time 1432    PT Stop Time 1518    PT Time Calculation (min) 46 min    Equipment Utilized During Treatment Gait belt    Activity Tolerance Patient tolerated treatment well;Patient limited by fatigue    Behavior During Therapy [facility] for tasks assessed/performed;Flat affect                     Past Medical History:  Diagnosis Date   Alcohol abuse    Anemia    Arthritis    Ascites    Cirrhosis ([name])    Coffee ground emesis    Dehydration 06/17/2017   Febrile illness    GERD (gastroesophageal reflux disease)    Heart murmur    History of blood transfusion    Hyperlipidemia    Hypertension    Leg swelling    Myocardial infarction ([name]) 2012   Preop cardiovascular exam 04/14/2013   Sepsis ([name]) 06/17/2017   Septic shock ([name]) 06/18/2017   SIRS (systemic inflammatory response syndrome) ([name]) 07/11/2017   Stroke ([name]) 10/2009   TIA   Thrombocytopenia ([name])    Past Surgical History:  Procedure Laterality Date   AMPUTATION Right 11/07/2022   Procedure: RIGHT ABOVE KNEE AMPUTATION;  Surgeon: Nadara Mustard, MD;  Location: [facility] OR;  Service: Orthopedics;  Laterality: Right;   COLONOSCOPY WITH PROPOFOL N/A 02/07/2016   Procedure: COLONOSCOPY WITH PROPOFOL;  Surgeon: Rachael Fee, MD;  Location: [facility] ENDOSCOPY;  Service: Endoscopy;  Laterality: N/A;   ERCP N/A 02/28/2023   Procedure: ENDOSCOPIC RETROGRADE CHOLANGIOPANCREATOGRAPHY (ERCP);  Surgeon: Jeani Hawking, MD;  Location: [facility];  Service:  Gastroenterology;  Laterality: N/A;   ESOPHAGOGASTRODUODENOSCOPY (EGD) WITH PROPOFOL N/A 02/07/2016   Procedure: ESOPHAGOGASTRODUODENOSCOPY (EGD) WITH PROPOFOL;  Surgeon: Rachael Fee, MD;  Location: [facility];  Service: Endoscopy;  Laterality: N/A;   ESOPHAGOGASTRODUODENOSCOPY (EGD) WITH PROPOFOL N/A 06/22/2017   Procedure: ESOPHAGOGASTRODUODENOSCOPY (EGD) WITH PROPOFOL;  Surgeon: Beverley Fiedler, MD;  Location: [facility] ENDOSCOPY;  Service: Gastroenterology;  Laterality: N/A;   EXCISIONAL TOTAL KNEE ARTHROPLASTY WITH ANTIBIOTIC SPACERS Right 03/20/2020   Procedure: Resection right total knee arthroplasty and placement of antibiotic spacer;  Surgeon: Durene Romans, MD;  Location: [facility] ORS;  Service: Orthopedics;  Laterality: Right;  90 mins   HERNIA REPAIR Right    inguinal   INGUINAL HERNIA REPAIR Left 02/08/2021   Procedure: OPEN LEFT INGUINAL HERNIA REPAIR WITH MESH;  Surgeon: Abigail Miyamoto, MD;  Location: [facility] OR;  Service: General;  Laterality: Left;   IRRIGATION AND DEBRIDEMENT KNEE Right 11/13/2020   Procedure: IRRIGATION AND DEBRIDEMENT KNEE;  Surgeon: Durene Romans, MD;  Location: [facility] ORS;  Service: Orthopedics;  Laterality: Right;   JOINT REPLACEMENT     KNEE ARTHROSCOPY     bilateral/  12/14   REIMPLANTATION OF TOTAL KNEE Right 08/02/2020   Procedure: REIMPLANTATION/REVISION OF TOTAL KNEE WITH REMOVAL OF ANTIBIOTIC SPACER;  Surgeon: Durene Romans, MD;  Location: [facility];  Service: Orthopedics;  Laterality: Right;    REMOVAL OF STONES  02/28/2023   Procedure: REMOVAL OF DEBRIS;  Surgeon: Jeani Hawking, MD;  Location: [facility];  Service: Gastroenterology;;   Dennison Mascot  02/28/2023   Procedure: Dennison Mascot;  Surgeon: Jeani Hawking, MD;  Location: [facility];  Service: Gastroenterology;;   TEE WITHOUT CARDIOVERSION N/A 05/13/2018   Procedure: TRANSESOPHAGEAL ECHOCARDIOGRAM (TEE);  Surgeon: Parke Poisson, MD;  Location: [name] ENDOSCOPY;  Service: Cardiovascular;   Laterality: N/A;   TOTAL KNEE ARTHROPLASTY Right 05/02/2013   Procedure: RIGHT TOTAL KNEE ARTHROPLASTY;  Surgeon: Shelda Pal, MD;  Location: [facility] ORS;  Service: Orthopedics;  Laterality: Right;   Patient Active Problem List   Diagnosis Date Noted   Acute blood loss anemia 03/02/2023   Choledocholithiasis 02/28/2023   HTN (hypertension) 02/28/2023   Hypokalemia 02/28/2023   Cholelithiasis with biliary obstruction 02/27/2023   Infection of total knee replacement ([name]) 11/07/2022   Hx of AKA (above knee amputation), right ([name]) 11/07/2022   Cerebrovascular disease 07/08/2022   Renal cyst 07/08/2022   Protein-calorie malnutrition, severe 07/08/2022   Toxic metabolic encephalopathy 02/26/2022   Septic joint of right knee joint ([name]) 11/08/2020   S/P right TKA reimplantation 08/02/2020   Infection of total left knee replacement ([name]) 03/20/2020   Cirrhosis of liver without ascites ([name])    AKI (acute kidney injury) ([name])    Bacteremia    Spinal stenosis of lumbar region without neurogenic claudication    Aspiration pneumonia ([name]) 09/24/2018   Abscess in epidural space of lumbar spine    MRSA bacteremia 09/21/2018   Infection of prosthetic right knee joint ([name]) 09/20/2018   Anemia of chronic disease 09/20/2018   Hypoalbuminemia 09/20/2018   Hypoglycemia without diagnosis of diabetes mellitus 09/20/2018   Epidural abscess 09/20/2018   Hyperkalemia 05/18/2018   Edentulous 05/11/2018   Pancytopenia ([name]) 05/10/2018   CAD (coronary artery disease) 05/10/2018   Hepatic encephalopathy ([name]) 05/10/2018   Alcohol abuse 05/10/2018   GERD (gastroesophageal reflux disease) 05/10/2018   Duodenal ulcer    Renal failure    Acute on chronic anemia    Hypotension 06/17/2017   Hyponatremia 06/17/2017   Leg edema, right 06/17/2017   Acute metabolic encephalopathy 06/17/2017   Anemia, iron deficiency    Benign neoplasm of ascending colon    Hemorrhoids    Portal hypertensive gastropathy  ([name])    Gastritis and gastroduodenitis    Esophageal varices without bleeding ([name])    H/O: CVA (cerebrovascular accident) 12/01/2013   ACS (acute coronary syndrome) ([name]) 12/01/2013   Anasarca 12/01/2013   Polysubstance abuse ([name]) 12/01/2013   CKD (chronic kidney disease) stage 3, GFR 30-59 ml/min ([name]) 12/01/2013   S/P right TKA 05/02/2013   Murmur 04/14/2013   Right inguinal hernia 12/26/2010   Cirrhosis, alcoholic ([name]) 12/20/2010    ONSET DATE: 02/10/2023 prosthesis delivery  REFERRING DIAG: Z89.611 (ICD-10-CM) - Hx of AKA (above knee amputation), right   THERAPY DIAG:  Other abnormalities of gait and mobility  Repeated falls  Unsteadiness on feet  Muscle weakness (generalized)  Stiffness of right hip, not elsewhere classified  Rationale for Evaluation and Treatment: Rehabilitation  SUBJECTIVE:   SUBJECTIVE STATEMENT: He is wearing prosthesis only ~ 3 hours per day because he has not been going out of the house.   PERTINENT HISTORY: right TFA 11/07/22, inguinal hernia repair BLEs, ETOH abuse, cirrhosis, arthritis, GERD, HTN, HLD, heart murmur, MI, TIA 2011, systemic inflammatory response syndrome  PAIN:  Are you having  pain? Yes: NPRS scale: today  7/10 since last PT 4/10 to 8/10 Pain location: right residual limb more distal lateral Pain description: sharp Aggravating factors: unknown Relieving factors:  pain pill, sit to rest  PRECAUTIONS: Fall  WEIGHT BEARING RESTRICTIONS: No  FALLS: Has patient fallen in last 6 months? Yes. Number of falls 3 times no injuries. Off balance  LIVING ENVIRONMENT: Lives with: lives with their spouse and dog 60#  Lives in: House Home Access: Stairs to enter Home layout: One level Stairs: Yes: External: 2 steps to porch without rails and threshold into house Has following equipment at home: Single point cane, Environmental consultant - 2 wheeled, Chief Operating Officer, Wheelchair (manual), and Tour manager  OCCUPATION:   on disability. 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   OBJECTIVE:  COGNITION: Evaluation on 04/13/2023:  pt appeared high and was slow answering questions.  Evaluation on 02/16/2023:  Overall cognitive status: Within functional limits for tasks assessed  POSTURE: Evaluation on 02/16/2023: rounded shoulders, forward head, flexed trunk , and weight shift left  LOWER EXTREMITY ROM:  ROM P:passive  A:active Right Eval 02/16/23  Hip flexion   Hip extension Thomas -9*  Hip abduction   Hip adduction   Hip internal rotation   Hip external rotation   Knee flexion   Knee extension   Ankle dorsiflexion   Ankle plantarflexion   Ankle inversion   Ankle eversion    (Blank rows = not tested)  LOWER EXTREMITY MMT:  MMT Right Eval 02/16/23 Left Eval 02/16/23  Hip flexion 4/5 5/5  Hip extension 3/5 4/5  Hip abduction 3+/5 4/5  Hip adduction    Hip internal rotation    Hip external rotation    Knee flexion  5/5  Knee extension  5/5  Ankle dorsiflexion  5/5  Ankle plantarflexion    Ankle inversion    Ankle eversion    At Evaluation all strength testing is grossly seated and functionally standing / gait. (Blank rows = not tested)  BED MOBILITY:  Evaluation on 02/16/2023:  Modified independent supine <> sit  TRANSFERS: 04/13/2023:  sit to stand pulling up on //bars with supervision.  Pt unable to control prosthetic knee.   03/30/2023: Sit to stand: SBA requires use of armrests from stable chair and external support for stabilization Stand to sit: SBA requires use or armrests on stable chair and sturdy chair.   FUNCTIONAL TESTs:  05/18/2023:  Standing at sink with one hand hovering for safety: looking over shoulders to right & left and reaching forward 7"  to tap back wall.  PT cued verbal & tactile on equal weight bearing.    04/15/2023:  patient requires minA for static balance 60 seconds.    GAIT: 05/18/2023: Pt amb 100' with axillary crutches with supervision / verbal cues for pelvic orientation and upright posture.  Prosthetic knee was stable in stance with changes from prosthetist.   Pt neg 12* incline with crutches with supervision with difficulty weight shifting over prosthesis in stance. PT demo option of side stepping on ramp or incline if too steep.  Pt able to side step on 12* incline with supervision and pt reports easier.   Pt neg 6.5" curb with axillary crutches with supervision.    04/15/2023: Gait pattern: step to  pattern, decreased step length- Left, decreased stance time- Right, decreased hip/knee flexion- Right, circumduction- Right, Right hip hike, antalgic, trendelenburg, lateral hip instability, trunk flexed, abducted- Right, and poor foot clearance- Right POOR weight shift onto prosthesis in stance,  Distance walked: 100' Assistive device utilized: Crutches and TFA prosthesis Level of assistance: SBA with crutches   03/30/2023: Gait pattern: step to pattern, decreased step length- Left, decreased stance time- Right, decreased hip/knee flexion- Right, circumduction- Right, Right hip hike, antalgic, trendelenburg, lateral hip instability, trunk flexed, abducted- Right, and poor foot clearance- Right Distance walked: 100' Assistive device utilized: Crutches and TFA prosthesis Level of assistance: SBA with crutches Comments: pt amb 3' with HHA maxA   CURRENT PROSTHETIC WEAR ASSESSMENT: 05/18/2023:  pt verbalizes proper donning & cleaning of prosthesis. Pt reports wear >10 hours without skin issues.    Evaluation on 02/16/2023:  Patient is dependent with: skin check, residual limb care, care of non-amputated limb, prosthetic cleaning, ply sock cleaning, correct ply sock adjustment, proper wear schedule/adjustment, and  proper weight-bearing schedule/adjustment Donning prosthesis: SBA Doffing prosthesis: SBA Prosthetic wear tolerance: 2-8 hours, 1x/day5 of 6 days since delivery Prosthetic weight bearing tolerance: 5 minutes with limb pain 7/10 Edema: pitting edema Residual limb condition: no open areas, normal color, temperature & moisture, scar is mobile with slight invagination. Prosthetic description: silicon liner with velcro landyard, ischial containment socket with flexible inner liner, multi-axial hydraulic knee, dynamic response foot K code/activity level with prosthetic use: Level 3    TODAY'S TREATMENT:                                                                                                                             DATE:  06/09/2023: Prosthetic Training with Transfemoral Prosthesis:  PT reviewed tightening suspension strap. When strap is loose, he will have poor control and socket may rotate.  PT reviewed goal for wear is all awake hours to enable mobility weather he is in his home or going out.  Pt verbalized understanding.  PT instructed in 4 point movement pattern using numbered cards while seated to get rhythm. PT demo how 4 point gait pattern facilitates trunk rotation and step through pattern that should improve his balance and prosthesis control during gait.  Pt performed 10 reps of seated 4 point pattern 2 sets.   Pt amb 50' with crutches using 4 point pattern with minA and constant PT cues for sequencing, upright trunk and proper step length for speed ambulating.  Pt amb 50' with 2 canes stand alone tip with minA using 4 point pattern and constant PT cues for sequencing, upright trunk and proper step length for speed ambulating.  Neuromuscular Re-education: Working on standing balance: UE resistive exercises for core stabilization with weight bearing thru BLEs - green theraband alternating UEs and BUEs 10 reps ea - row, forward reach and biceps curls.  All with PT tactile & verbal  cues for upright posture and minA for balance.  TREATMENT:                                                                                                                             DATE:  06/01/2023: Prosthetic Training with Transfemoral Prosthesis:  Pt arrived to PT with severely flexed trunk, too long prosthetic step so poor weight shift over prosthesis.  Pt amb 100' with axillary crutches with 3-point pattern with PT cues to look forward (not staring at floor) to facilitate upright posture with weight shift over prosthesis in stance.    Neuromuscular Re-education: Stationary balance with posterior pelvis to counter with BUEs on counter - working on equal weight bearing with proper posture.  PT advised to try to stand like this 5 min 3-5 times a day.  Pt verbalized understanding.  Standing with minA / tactile cues for upright posture UE resistance green theraband alternating UEs & BUEs 5 reps ea - row, forward reach and upward reach.   Standing in corner with chair in front for safety: hip width stance on floor - eyes open 5 reps ea head motions right/left & up/down;  static eyes closed up to 5 sec 3 reps.     TREATMENT:                                                                                                                             DATE:  05/18/2023: Prosthetic Training with Transfemoral Prosthesis:  Pt sit to/from stand chairs without armrests using UE to crutches with verbal cues.   Pt amb 50' and 100' with axillary crutches with supervision / verbal cues for pelvic orientation and upright posture.  Prosthetic knee was stable in stance with changes from prosthetist.   Pt neg 12* incline with crutches with supervision with difficulty weight shifting over prosthesis in stance. PT demo option of side stepping on ramp or incline if too steep.  Pt able to side step on 12* incline with supervision and pt reports easier.   Pt neg 6.5" curb with axillary crutches with supervision.    Standing at sink with one hand hovering for safety: looking over shoulders to right & left and reaching forward 7" to tap back wall.  PT cued verbal & tactile on equal weight bearing.       HOME EXERCISE PROGRAM:  ASSESSMENT:  CLINICAL IMPRESSION: PT educated on 4 point pattern for gait with crutches and with 2 canes which improved  his balance and prosthetic control.  PT also worked on balance using resistive bands which he improved with instruction and repetition.    Patient benefits from skilled PT to improve function & safety.    OBJECTIVE IMPAIRMENTS: Abnormal gait, decreased activity tolerance, decreased balance, decreased endurance, decreased knowledge of condition, decreased knowledge of use of DME, decreased mobility, difficulty walking, decreased ROM, decreased strength, decreased safety awareness, postural dysfunction, prosthetic dependency , and pain.   ACTIVITY LIMITATIONS: carrying, lifting, standing, stairs, transfers, and locomotion level  PARTICIPATION LIMITATIONS: meal prep, cleaning, driving, community activity, occupation, and yard work  PERSONAL FACTORS: Education, Fitness, Past/current experiences, Time since onset of injury/illness/exacerbation, and 3+ comorbidities: see PMH  are also affecting patient's functional outcome.   REHAB POTENTIAL: Good  CLINICAL DECISION MAKING: Evolving/moderate complexity  EVALUATION COMPLEXITY: Moderate   GOALS: Goals reviewed with patient? Yes  SHORT TERM GOALS: Target date: 05/14/2023  Patient donnes prosthesis modified independent & verbalizes proper cleaning. Baseline: SEE OBJECTIVE DATA Goal status: MET 05/18/2023 2.  Patient tolerates prosthesis >10 hrs total /day without skin issues or limb pain >5/10 after standing. Baseline: SEE OBJECTIVE DATA Goal status: MET 05/18/2023  3.  Patient able to reach 7" and look over both shoulders without UE support with supervision. Baseline: SEE OBJECTIVE DATA Goal status: MET  05/18/2023  4. Patient ambulates 200' with crutches & prosthesis with supervision / verbal cues only. Baseline: SEE OBJECTIVE DATA Goal status: MET 05/18/2023  5. Patient negotiates ramps & curbs with crutches & prosthesis with supervision. Baseline: SEE OBJECTIVE DATA Goal status: MET 05/18/2023  LONG TERM GOALS: Target date: 07/09/2023  Patient demonstrates & verbalized understanding of prosthetic care to enable safe utilization of prosthesis. Baseline: SEE OBJECTIVE DATA Goal status: Ongoing 06/09/2023  Patient tolerates prosthesis wear >90% of awake hours without skin or limb pain issues. Baseline: SEE OBJECTIVE DATA Goal status: Ongoing 06/09/2023  Berg Balance >36/56 to indicate lower fall risk Baseline: SEE OBJECTIVE DATA Goal status: Ongoing 06/09/2023  Patient ambulates >300' with prosthesis and cane or less independently Baseline: SEE OBJECTIVE DATA Goal status: Ongoing 06/09/2023  Patient negotiates ramps, curbs & stairs with single rail with prosthesis and cane independently. Baseline: SEE OBJECTIVE DATA Goal status: Ongoing 06/09/2023    PLAN:  PT FREQUENCY: 2x/week  PT DURATION: 13 weeks / 90 days  PLANNED INTERVENTIONS: [id number]- PT Re-evaluation, 97110-Therapeutic exercises, 97530- Therapeutic activity, 97112- Neuromuscular re-education, 97535- Self Care, [id number]- Manual therapy, [phone]- Gait training, [phone]- Prosthetic training, Patient/Family education, Balance training, Stair training, Vestibular training, DME instructions, Therapeutic exercises, Therapeutic activity, Neuromuscular re-education, Gait training, and Self Care  PLAN FOR NEXT SESSION:     introduce stairs, continue prosthetic training for balance & gait 4 point pattern with crutches and with canes,  work on ramps & curbs.  Vladimir Faster, PT, DPT 06/09/2023, 3:32 PM

## 2023-06-11 ENCOUNTER — Encounter: Payer: Self-pay | Admitting: Physical Therapy

## 2023-06-11 ENCOUNTER — Ambulatory Visit (INDEPENDENT_AMBULATORY_CARE_PROVIDER_SITE_OTHER): Payer: Medicare HMO | Admitting: Physical Therapy

## 2023-06-11 DIAGNOSIS — M6281 Muscle weakness (generalized): Secondary | ICD-10-CM

## 2023-06-11 DIAGNOSIS — M25651 Stiffness of right hip, not elsewhere classified: Secondary | ICD-10-CM

## 2023-06-11 DIAGNOSIS — R2681 Unsteadiness on feet: Secondary | ICD-10-CM

## 2023-06-11 DIAGNOSIS — R296 Repeated falls: Secondary | ICD-10-CM

## 2023-06-11 DIAGNOSIS — R2689 Other abnormalities of gait and mobility: Secondary | ICD-10-CM

## 2023-06-11 NOTE — Therapy (Signed)
OUTPATIENT PHYSICAL THERAPY PROSTHETIC TREATMENT   Patient Name: Mark [name]. MRN: [id number] DOB:July 27, 1955, 68 y.o., male Today's Date: 06/11/2023  PCP: Abbe Amsterdam, MD REFERRING PROVIDER: Aldean Baker, MD  END OF SESSION:  PT End of Session - 06/11/23 1438     Visit Number 9    Number of Visits 25    Date for PT Re-Evaluation 07/09/23    Authorization Type [employer] Medicare HMO/PPO and [employer] PPO    Progress Note Due on Visit 10    PT Start Time 1436    PT Stop Time 1514    PT Time Calculation (min) 38 min    Equipment Utilized During Treatment Gait belt    Activity Tolerance Patient tolerated treatment well;Patient limited by fatigue    Behavior During Therapy [facility] for tasks assessed/performed;Flat affect                     Past Medical History:  Diagnosis Date   Alcohol abuse    Anemia    Arthritis    Ascites    Cirrhosis ([name])    Coffee ground emesis    Dehydration 06/17/2017   Febrile illness    GERD (gastroesophageal reflux disease)    Heart murmur    History of blood transfusion    Hyperlipidemia    Hypertension    Leg swelling    Myocardial infarction ([name]) 2012   Preop cardiovascular exam 04/14/2013   Sepsis ([name]) 06/17/2017   Septic shock ([name]) 06/18/2017   SIRS (systemic inflammatory response syndrome) ([name]) 07/11/2017   Stroke ([name]) 10/2009   TIA   Thrombocytopenia ([name])    Past Surgical History:  Procedure Laterality Date   AMPUTATION Right 11/07/2022   Procedure: RIGHT ABOVE KNEE AMPUTATION;  Surgeon: Nadara Mustard, MD;  Location: [facility] OR;  Service: Orthopedics;  Laterality: Right;   COLONOSCOPY WITH PROPOFOL N/A 02/07/2016   Procedure: COLONOSCOPY WITH PROPOFOL;  Surgeon: Rachael Fee, MD;  Location: [facility] ENDOSCOPY;  Service: Endoscopy;  Laterality: N/A;   ERCP N/A 02/28/2023   Procedure: ENDOSCOPIC RETROGRADE CHOLANGIOPANCREATOGRAPHY (ERCP);  Surgeon: Jeani Hawking, MD;  Location: [facility]OSCOPY;  Service:  Gastroenterology;  Laterality: N/A;   ESOPHAGOGASTRODUODENOSCOPY (EGD) WITH PROPOFOL N/A 02/07/2016   Procedure: ESOPHAGOGASTRODUODENOSCOPY (EGD) WITH PROPOFOL;  Surgeon: Rachael Fee, MD;  Location: [facility] ENDOSCOPY;  Service: Endoscopy;  Laterality: N/A;   ESOPHAGOGASTRODUODENOSCOPY (EGD) WITH PROPOFOL N/A 06/22/2017   Procedure: ESOPHAGOGASTRODUODENOSCOPY (EGD) WITH PROPOFOL;  Surgeon: Beverley Fiedler, MD;  Location: [facility] ENDOSCOPY;  Service: Gastroenterology;  Laterality: N/A;   EXCISIONAL TOTAL KNEE ARTHROPLASTY WITH ANTIBIOTIC SPACERS Right 03/20/2020   Procedure: Resection right total knee arthroplasty and placement of antibiotic spacer;  Surgeon: Durene Romans, MD;  Location: [facility] ORS;  Service: Orthopedics;  Laterality: Right;  90 mins   HERNIA REPAIR Right    inguinal   INGUINAL HERNIA REPAIR Left 02/08/2021   Procedure: OPEN LEFT INGUINAL HERNIA REPAIR WITH MESH;  Surgeon: Abigail Miyamoto, MD;  Location: [facility] OR;  Service: General;  Laterality: Left;   IRRIGATION AND DEBRIDEMENT KNEE Right 11/13/2020   Procedure: IRRIGATION AND DEBRIDEMENT KNEE;  Surgeon: Durene Romans, MD;  Location: [facility] ORS;  Service: Orthopedics;  Laterality: Right;   JOINT REPLACEMENT     KNEE ARTHROSCOPY     bilateral/  12/14   REIMPLANTATION OF TOTAL KNEE Right 08/02/2020   Procedure: REIMPLANTATION/REVISION OF TOTAL KNEE WITH REMOVAL OF ANTIBIOTIC SPACER;  Surgeon: Durene Romans, MD;  Location: [facility] ORS;  Service: Orthopedics;  Laterality: Right;    REMOVAL OF STONES  02/28/2023   Procedure: REMOVAL OF DEBRIS;  Surgeon: Jeani Hawking, MD;  Location: [facility];  Service: Gastroenterology;;   Dennison Mascot  02/28/2023   Procedure: Dennison Mascot;  Surgeon: Jeani Hawking, MD;  Location: [facility] ENDOSCOPY;  Service: Gastroenterology;;   TEE WITHOUT CARDIOVERSION N/A 05/13/2018   Procedure: TRANSESOPHAGEAL ECHOCARDIOGRAM (TEE);  Surgeon: Parke Poisson, MD;  Location: [facility] ENDOSCOPY;  Service: Cardiovascular;   Laterality: N/A;   TOTAL KNEE ARTHROPLASTY Right 05/02/2013   Procedure: RIGHT TOTAL KNEE ARTHROPLASTY;  Surgeon: Shelda Pal, MD;  Location: [facility] ORS;  Service: Orthopedics;  Laterality: Right;   Patient Active Problem List   Diagnosis Date Noted   Acute blood loss anemia 03/02/2023   Choledocholithiasis 02/28/2023   HTN (hypertension) 02/28/2023   Hypokalemia 02/28/2023   Cholelithiasis with biliary obstruction 02/27/2023   Infection of total knee replacement ([name]) 11/07/2022   Hx of AKA (above knee amputation), right ([name]) 11/07/2022   Cerebrovascular disease 07/08/2022   Renal cyst 07/08/2022   Protein-calorie malnutrition, severe 07/08/2022   Toxic metabolic encephalopathy 02/26/2022   Septic joint of right knee joint ([name]) 11/08/2020   S/P right TKA reimplantation 08/02/2020   Infection of total left knee replacement ([name]) 03/20/2020   Cirrhosis of liver without ascites ([name])    AKI (acute kidney injury) ([name])    Bacteremia    Spinal stenosis of lumbar region without neurogenic claudication    Aspiration pneumonia ([name]) 09/24/2018   Abscess in epidural space of lumbar spine    MRSA bacteremia 09/21/2018   Infection of prosthetic right knee joint ([name]) 09/20/2018   Anemia of chronic disease 09/20/2018   Hypoalbuminemia 09/20/2018   Hypoglycemia without diagnosis of diabetes mellitus 09/20/2018   Epidural abscess 09/20/2018   Hyperkalemia 05/18/2018   Edentulous 05/11/2018   Pancytopenia ([name]) 05/10/2018   CAD (coronary artery disease) 05/10/2018   Hepatic encephalopathy ([name]) 05/10/2018   Alcohol abuse 05/10/2018   GERD (gastroesophageal reflux disease) 05/10/2018   Duodenal ulcer    Renal failure    Acute on chronic anemia    Hypotension 06/17/2017   Hyponatremia 06/17/2017   Leg edema, right 06/17/2017   Acute metabolic encephalopathy 06/17/2017   Anemia, iron deficiency    Benign neoplasm of ascending colon    Hemorrhoids    Portal hypertensive gastropathy  ([name])    Gastritis and gastroduodenitis    Esophageal varices without bleeding ([name])    H/O: CVA (cerebrovascular accident) 12/01/2013   ACS (acute coronary syndrome) ([name]) 12/01/2013   Anasarca 12/01/2013   Polysubstance abuse ([name]) 12/01/2013   CKD (chronic kidney disease) stage 3, GFR 30-59 ml/min ([name]) 12/01/2013   S/P right TKA 05/02/2013   Murmur 04/14/2013   Right inguinal hernia 12/26/2010   Cirrhosis, alcoholic ([name]) 12/20/2010    ONSET DATE: 02/10/2023 prosthesis delivery  REFERRING DIAG: Z89.611 (ICD-10-CM) - Hx of AKA (above knee amputation), right   THERAPY DIAG:  Other abnormalities of gait and mobility  Repeated falls  Unsteadiness on feet  Muscle weakness (generalized)  Stiffness of right hip, not elsewhere classified  Rationale for Evaluation and Treatment: Rehabilitation  SUBJECTIVE:   SUBJECTIVE STATEMENT: He has been working at home with sequence of 4 point gait pattern with cards  PERTINENT HISTORY: right TFA 11/07/22, inguinal hernia repair BLEs, ETOH abuse, cirrhosis, arthritis, GERD, HTN, HLD, heart murmur, MI, TIA 2011, systemic inflammatory response syndrome  PAIN:  Are you having pain? Yes: NPRS scale: today  4/10 pain today Pain location: right residual limb more distal lateral Pain description: sharp Aggravating factors: unknown Relieving factors:  pain pill, sit to rest  PRECAUTIONS: Fall  WEIGHT BEARING RESTRICTIONS: No  FALLS: Has patient fallen in last 6 months? Yes. Number of falls 3 times no injuries. Off balance  LIVING ENVIRONMENT: Lives with: lives with their spouse and dog 60#  Lives in: House Home Access: Stairs to enter Home layout: One level Stairs: Yes: External: 2 steps to porch without rails and threshold into house Has following equipment at home: Single point cane, Environmental consultant - 2 wheeled, Chief Operating Officer, Wheelchair (manual), and Tour manager  OCCUPATION:   on disability. 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   OBJECTIVE:  COGNITION: Evaluation on 04/13/2023:  pt appeared high and was slow answering questions.  Evaluation on 02/16/2023:  Overall cognitive status: Within functional limits for tasks assessed  POSTURE: Evaluation on 02/16/2023: rounded shoulders, forward head, flexed trunk , and weight shift left  LOWER EXTREMITY ROM:  ROM P:passive  A:active Right Eval 02/16/23  Hip flexion   Hip extension Thomas -9*  Hip abduction   Hip adduction   Hip internal rotation   Hip external rotation   Knee flexion   Knee extension   Ankle dorsiflexion   Ankle plantarflexion   Ankle inversion   Ankle eversion    (Blank rows = not tested)  LOWER EXTREMITY MMT:  MMT Right Eval 02/16/23 Left Eval 02/16/23  Hip flexion 4/5 5/5  Hip extension 3/5 4/5  Hip abduction 3+/5 4/5  Hip adduction    Hip internal rotation    Hip external rotation    Knee flexion  5/5  Knee extension  5/5  Ankle dorsiflexion  5/5  Ankle plantarflexion    Ankle inversion    Ankle eversion    At Evaluation all strength testing is grossly seated and functionally standing / gait. (Blank rows = not tested)  BED MOBILITY:  Evaluation on 02/16/2023:  Modified independent supine <> sit  TRANSFERS: 04/13/2023:  sit to stand pulling up on //bars with supervision.  Pt unable to control prosthetic knee.   03/30/2023: Sit to stand: SBA requires use of armrests from stable chair and external support for stabilization Stand to sit: SBA requires use or armrests on stable chair and sturdy chair.   FUNCTIONAL TESTs:  05/18/2023:  Standing at sink with one hand hovering for safety: looking over shoulders to right & left and reaching forward 7" to tap back wall.  PT cued  verbal & tactile on equal weight bearing.    04/15/2023:  patient requires minA for static balance 60 seconds.    GAIT: 05/18/2023: Pt amb 100' with axillary crutches with supervision / verbal cues for pelvic orientation and upright posture.  Prosthetic knee was stable in stance with changes from prosthetist.   Pt neg 12* incline with crutches with supervision with difficulty weight shifting over prosthesis in stance. PT demo option of side stepping on ramp or incline if too steep.  Pt able to side step on 12* incline with supervision and pt reports easier.   Pt neg 6.5" curb with axillary crutches with supervision.    04/15/2023: Gait pattern: step to pattern, decreased step length- Left, decreased stance time- Right, decreased  hip/knee flexion- Right, circumduction- Right, Right hip hike, antalgic, trendelenburg, lateral hip instability, trunk flexed, abducted- Right, and poor foot clearance- Right POOR weight shift onto prosthesis in stance,  Distance walked: 100' Assistive device utilized: Crutches and TFA prosthesis Level of assistance: SBA with crutches   03/30/2023: Gait pattern: step to pattern, decreased step length- Left, decreased stance time- Right, decreased hip/knee flexion- Right, circumduction- Right, Right hip hike, antalgic, trendelenburg, lateral hip instability, trunk flexed, abducted- Right, and poor foot clearance- Right Distance walked: 100' Assistive device utilized: Crutches and TFA prosthesis Level of assistance: SBA with crutches Comments: pt amb 3' with HHA maxA   CURRENT PROSTHETIC WEAR ASSESSMENT: 05/18/2023:  pt verbalizes proper donning & cleaning of prosthesis. Pt reports wear >10 hours without skin issues.    Evaluation on 02/16/2023:  Patient is dependent with: skin check, residual limb care, care of non-amputated limb, prosthetic cleaning, ply sock cleaning, correct ply sock adjustment, proper wear schedule/adjustment, and proper weight-bearing  schedule/adjustment Donning prosthesis: SBA Doffing prosthesis: SBA Prosthetic wear tolerance: 2-8 hours, 1x/day5 of 6 days since delivery Prosthetic weight bearing tolerance: 5 minutes with limb pain 7/10 Edema: pitting edema Residual limb condition: no open areas, normal color, temperature & moisture, scar is mobile with slight invagination. Prosthetic description: silicon liner with velcro landyard, ischial containment socket with flexible inner liner, multi-axial hydraulic knee, dynamic response foot K code/activity level with prosthetic use: Level 3    TODAY'S TREATMENT:                                                                                                                             DATE:  06/11/2023: Prosthetic Training with Transfemoral Prosthesis:  He arrives with TFA prosthesis rotated so PT again reviewed tightening suspension strap and sometimes may been to remove and rotate back to neural then reapply strap.  PT instructed in 4 point movement pattern. PT demo 4 point gait pattern then had hem return demonstrate with P amb 50' X 4 with crutches using 4 point pattern with minA and constant PT cues for sequencing, upright trunk and proper step length for speed ambulating.  Stairs in clinic hall with one hand rail  on left and crutches in right hand to simulate home as he plans to get handrail installed on left side going up to enter home. Able to perform with CGA after cues and demo for technique.  Neuromuscular Re-education: Working on standing balance: UE resistive exercises for core stabilization with weight bearing thru BLEs - green theraband alternating UEs and BUEs 10 reps ea - row, forward reach and biceps curls.  All with PT tactile & verbal cues for upright posture and minA for balance.  06/09/2023: Prosthetic Training with Transfemoral Prosthesis:  PT reviewed tightening suspension strap. When strap is loose, he will have poor control and socket may rotate.  PT  reviewed goal for wear is all awake hours to enable mobility weather he is in his home or  going out.  Pt verbalized understanding.  PT instructed in 4 point movement pattern using numbered cards while seated to get rhythm. PT demo how 4 point gait pattern facilitates trunk rotation and step through pattern that should improve his balance and prosthesis control during gait.  Pt performed 10 reps of seated 4 point pattern 2 sets.   Pt amb 50' with crutches using 4 point pattern with minA and constant PT cues for sequencing, upright trunk and proper step length for speed ambulating.  Pt amb 50' with 2 canes stand alone tip with minA using 4 point pattern and constant PT cues for sequencing, upright trunk and proper step length for speed ambulating.  Neuromuscular Re-education: Working on standing balance: UE resistive exercises for core stabilization with weight bearing thru BLEs - green theraband alternating UEs and BUEs 10 reps ea - row, forward reach and biceps curls.  All with PT tactile & verbal cues for upright posture and minA for balance.     TREATMENT:                                                                                                                             DATE:  06/01/2023: Prosthetic Training with Transfemoral Prosthesis:  Pt arrived to PT with severely flexed trunk, too long prosthetic step so poor weight shift over prosthesis.  Pt amb 100' with axillary crutches with 3-point pattern with PT cues to look forward (not staring at floor) to facilitate upright posture with weight shift over prosthesis in stance.    Neuromuscular Re-education: Stationary balance with posterior pelvis to counter with BUEs on counter - working on equal weight bearing with proper posture.  PT advised to try to stand like this 5 min 3-5 times a day.  Pt verbalized understanding.  Standing with minA / tactile cues for upright posture UE resistance green theraband alternating UEs & BUEs 5 reps ea -  row, forward reach and upward reach.   Standing in corner with chair in front for safety: hip width stance on floor - eyes open 5 reps ea head motions right/left & up/down;  static eyes closed up to 5 sec 3 reps.     TREATMENT:                                                                                                                             DATE:  05/18/2023: Prosthetic Training with Transfemoral  Prosthesis:  Pt sit to/from stand chairs without armrests using UE to crutches with verbal cues.   Pt amb 50' and 100' with axillary crutches with supervision / verbal cues for pelvic orientation and upright posture.  Prosthetic knee was stable in stance with changes from prosthetist.   Pt neg 12* incline with crutches with supervision with difficulty weight shifting over prosthesis in stance. PT demo option of side stepping on ramp or incline if too steep.  Pt able to side step on 12* incline with supervision and pt reports easier.   Pt neg 6.5" curb with axillary crutches with supervision.   Standing at sink with one hand hovering for safety: looking over shoulders to right & left and reaching forward 7" to tap back wall.  PT cued verbal & tactile on equal weight bearing.       HOME EXERCISE PROGRAM:  ASSESSMENT:  CLINICAL IMPRESSION: Continued to work on 4 point pattern but he struggles with the proper sequence to this and wants to revert back to his 3 point pattern. This did improve some after practice but easily gets mixed up with sequence. We then worked on stairs if only one handrail available as he plans to have set up at his home. He did have good performance with this however.    OBJECTIVE IMPAIRMENTS: Abnormal gait, decreased activity tolerance, decreased balance, decreased endurance, decreased knowledge of condition, decreased knowledge of use of DME, decreased mobility, difficulty walking, decreased ROM, decreased strength, decreased safety awareness, postural dysfunction,  prosthetic dependency , and pain.   ACTIVITY LIMITATIONS: carrying, lifting, standing, stairs, transfers, and locomotion level  PARTICIPATION LIMITATIONS: meal prep, cleaning, driving, community activity, occupation, and yard work  PERSONAL FACTORS: Education, Fitness, Past/current experiences, Time since onset of injury/illness/exacerbation, and 3+ comorbidities: see PMH  are also affecting patient's functional outcome.   REHAB POTENTIAL: Good  CLINICAL DECISION MAKING: Evolving/moderate complexity  EVALUATION COMPLEXITY: Moderate   GOALS: Goals reviewed with patient? Yes  SHORT TERM GOALS: Target date: 05/14/2023  Patient donnes prosthesis modified independent & verbalizes proper cleaning. Baseline: SEE OBJECTIVE DATA Goal status: MET 05/18/2023 2.  Patient tolerates prosthesis >10 hrs total /day without skin issues or limb pain >5/10 after standing. Baseline: SEE OBJECTIVE DATA Goal status: MET 05/18/2023  3.  Patient able to reach 7" and look over both shoulders without UE support with supervision. Baseline: SEE OBJECTIVE DATA Goal status: MET 05/18/2023  4. Patient ambulates 200' with crutches & prosthesis with supervision / verbal cues only. Baseline: SEE OBJECTIVE DATA Goal status: MET 05/18/2023  5. Patient negotiates ramps & curbs with crutches & prosthesis with supervision. Baseline: SEE OBJECTIVE DATA Goal status: MET 05/18/2023  LONG TERM GOALS: Target date: 07/09/2023  Patient demonstrates & verbalized understanding of prosthetic care to enable safe utilization of prosthesis. Baseline: SEE OBJECTIVE DATA Goal status: Ongoing 06/09/2023  Patient tolerates prosthesis wear >90% of awake hours without skin or limb pain issues. Baseline: SEE OBJECTIVE DATA Goal status: Ongoing 06/09/2023  Berg Balance >36/56 to indicate lower fall risk Baseline: SEE OBJECTIVE DATA Goal status: Ongoing 06/09/2023  Patient ambulates >300' with prosthesis and cane or less  independently Baseline: SEE OBJECTIVE DATA Goal status: Ongoing 06/09/2023  Patient negotiates ramps, curbs & stairs with single rail with prosthesis and cane independently. Baseline: SEE OBJECTIVE DATA Goal status: Ongoing 06/09/2023    PLAN:  PT FREQUENCY: 2x/week  PT DURATION: 13 weeks / 90 days  PLANNED INTERVENTIONS: 97164- PT Re-evaluation, 97110-Therapeutic exercises, 97530- Therapeutic activity, 97112- Neuromuscular  re-education, [phone]- Self Care, [id number]- Manual therapy, [phone]- Gait training, [phone]- Prosthetic training, Patient/Family education, Balance training, Stair training, Vestibular training, DME instructions, Therapeutic exercises, Therapeutic activity, Neuromuscular re-education, Gait training, and Self Care  PLAN FOR NEXT SESSION:     stairs, continue prosthetic training for balance & gait 4 point pattern with crutches and with canes,  work on ramps & curbs.  April Manson, PT, DPT 06/11/2023, 2:38 PM

## 2023-06-16 ENCOUNTER — Ambulatory Visit (INDEPENDENT_AMBULATORY_CARE_PROVIDER_SITE_OTHER): Payer: Medicare HMO | Admitting: Physical Therapy

## 2023-06-16 ENCOUNTER — Encounter: Payer: Self-pay | Admitting: Physical Therapy

## 2023-06-16 DIAGNOSIS — R2681 Unsteadiness on feet: Secondary | ICD-10-CM

## 2023-06-16 DIAGNOSIS — M6281 Muscle weakness (generalized): Secondary | ICD-10-CM | POA: Diagnosis not present

## 2023-06-16 DIAGNOSIS — R2689 Other abnormalities of gait and mobility: Secondary | ICD-10-CM

## 2023-06-16 DIAGNOSIS — R296 Repeated falls: Secondary | ICD-10-CM | POA: Diagnosis not present

## 2023-06-16 DIAGNOSIS — M25651 Stiffness of right hip, not elsewhere classified: Secondary | ICD-10-CM | POA: Diagnosis not present

## 2023-06-16 NOTE — Therapy (Signed)
OUTPATIENT PHYSICAL THERAPY PROSTHETIC TREATMENTN & PROGRESS NOTE   Patient Name: Mark [name]. MRN: [id number] DOB:May 08, 1955, 68 y.o., male Today's Date: 06/16/2023  PCP: Abbe Amsterdam, MD REFERRING PROVIDER: Aldean Baker, MD  Progress Note Reporting Period 04/12/2024 to 06/16/2023  See note below for Objective Data and Assessment of Progress/Goals.      END OF SESSION:  PT End of Session - 06/16/23 1429     Visit Number 10    Number of Visits 25    Date for PT Re-Evaluation 07/09/23    Authorization Type [employer] Medicare HMO/PPO and [employer] PPO    Progress Note Due on Visit 10    PT Start Time 1430    PT Stop Time 1511    PT Time Calculation (min) 41 min    Equipment Utilized During Treatment Gait belt    Activity Tolerance Patient tolerated treatment well;Patient limited by fatigue    Behavior During Therapy WFL for tasks assessed/performed;Flat affect                      Past Medical History:  Diagnosis Date   Alcohol abuse    Anemia    Arthritis    Ascites    Cirrhosis ([name])    Coffee ground emesis    Dehydration 06/17/2017   Febrile illness    GERD (gastroesophageal reflux disease)    Heart murmur    History of blood transfusion    Hyperlipidemia    Hypertension    Leg swelling    Myocardial infarction ([name]) 2012   Preop cardiovascular exam 04/14/2013   Sepsis ([name]) 06/17/2017   Septic shock ([name]) 06/18/2017   SIRS (systemic inflammatory response syndrome) ([name]) 07/11/2017   Stroke ([name]) 10/2009   TIA   Thrombocytopenia ([name])    Past Surgical History:  Procedure Laterality Date   AMPUTATION Right 11/07/2022   Procedure: RIGHT ABOVE KNEE AMPUTATION;  Surgeon: Nadara Mustard, MD;  Location: [facility] OR;  Service: Orthopedics;  Laterality: Right;   COLONOSCOPY WITH PROPOFOL N/A 02/07/2016   Procedure: COLONOSCOPY WITH PROPOFOL;  Surgeon: Rachael Fee, MD;  Location: [facility] END[facility]COPY;  Service: Endoscopy;  Laterality: N/A;   ERCP  N/A 02/28/2023   Procedure: ENDOSCOPIC RETROGRADE CHOLANGIOPANCREATOGRAPHY (ERCP);  Surgeon: Jeani Hawking, MD;  Location: [facility];  Service: Gastroenterology;  Laterality: N/A;   ESOPHAGOGASTRODUODENOSCOPY (EGD) WITH PROPOFOL N/A 02/07/2016   Procedure: ESOPHAGOGASTRODUODENOSCOPY (EGD) WITH PROPOFOL;  Surgeon: Rachael Fee, MD;  Location: [facility];  Service: Endoscopy;  Laterality: N/A;   ESOPHAGOGASTRODUODENOSCOPY (EGD) WITH PROPOFOL N/A 06/22/2017   Procedure: ESOPHAGOGASTRODUODENOSCOPY (EGD) WITH PROPOFOL;  Surgeon: Beverley Fiedler, MD;  Location: [facility] ENDOSCOPY;  Service: Gastroenterology;  Laterality: N/A;   EXCISIONAL TOTAL KNEE ARTHROPLASTY WITH ANTIBIOTIC SPACERS Right 03/20/2020   Procedure: Resection right total knee arthroplasty and placement of antibiotic spacer;  Surgeon: Durene Romans, MD;  Location: [facility] ORS;  Service: Orthopedics;  Laterality: Right;  90 mins   HERNIA REPAIR Right    inguinal   INGUINAL HERNIA REPAIR Left 02/08/2021   Procedure: OPEN LEFT INGUINAL HERNIA REPAIR WITH MESH;  Surgeon: Abigail Miyamoto, MD;  Location: [facility] OR;  Service: General;  Laterality: Left;   IRRIGATION AND DEBRIDEMENT KNEE Right 11/13/2020   Procedure: IRRIGATION AND DEBRIDEMENT KNEE;  Surgeon: Durene Romans, MD;  Location: [facility] ORS;  Service: Orthopedics;  Laterality: Right;   JOINT REPLACEMENT     KNEE ARTHROSCOPY     bilateral/  12/14   REIMPLANTATION  OF TOTAL KNEE Right 08/02/2020   Procedure: REIMPLANTATION/REVISION OF TOTAL KNEE WITH REMOVAL OF ANTIBIOTIC SPACER;  Surgeon: Durene Romans, MD;  Location: [facility];  Service: Orthopedics;  Laterality: Right;    REMOVAL OF STONES  02/28/2023   Procedure: REMOVAL OF DEBRIS;  Surgeon: Jeani Hawking, MD;  Location: [facility];  Service: Gastroenterology;;   Dennison Mascot  02/28/2023   Procedure: Dennison Mascot;  Surgeon: Jeani Hawking, MD;  Location: [facility];  Service: Gastroenterology;;   TEE WITHOUT CARDIOVERSION N/A  05/13/2018   Procedure: TRANSESOPHAGEAL ECHOCARDIOGRAM (TEE);  Surgeon: Parke Poisson, MD;  Location: [facility];  Service: Cardiovascular;  Laterality: N/A;   TOTAL KNEE ARTHROPLASTY Right 05/02/2013   Procedure: RIGHT TOTAL KNEE ARTHROPLASTY;  Surgeon: Shelda Pal, MD;  Location: [facility] OR[facility];  Service: Orthopedics;  Laterality: Right;   Patient Active Problem List   Diagnosis Date Noted   Acute blood loss anemia 03/02/2023   Choledocholithiasis 02/28/2023   HTN (hypertension) 02/28/2023   Hypokalemia 02/28/2023   Cholelithiasis with biliary obstruction 02/27/2023   Infection of total knee replacement ([name]) 11/07/2022   Hx of AKA (above knee amputation), right ([name]) 11/07/2022   Cerebrovascular disease 07/08/2022   Renal cyst 07/08/2022   Protein-calorie malnutrition, severe 07/08/2022   Toxic metabolic encephalopathy 02/26/2022   Septic joint of right knee joint ([name]) 11/08/2020   S/P right TKA reimplantation 08/02/2020   Infection of total left knee replacement ([name]) 03/20/2020   Cirrhosis of liver without ascites ([name])    AKI (acute kidney injury) ([name])    Bacteremia    Spinal stenosis of lumbar region without neurogenic claudication    Aspiration pneumonia ([name]) 09/24/2018   Abscess in epidural space of lumbar spine    MRSA bacteremia 09/21/2018   Infection of prosthetic right knee joint ([name]) 09/20/2018   Anemia of chronic disease 09/20/2018   Hypoalbuminemia 09/20/2018   Hypoglycemia without diagnosis of diabetes mellitus 09/20/2018   Epidural abscess 09/20/2018   Hyperkalemia 05/18/2018   Edentulous 05/11/2018   Pancytopenia ([name]) 05/10/2018   CAD (coronary artery disease) 05/10/2018   Hepatic encephalopathy ([name]) 05/10/2018   Alcohol abuse 05/10/2018   GERD (gastroesophageal reflux disease) 05/10/2018   Duodenal ulcer    Renal failure    Acute on chronic anemia    Hypotension 06/17/2017   Hyponatremia 06/17/2017   Leg edema, right 06/17/2017   Acute  metabolic encephalopathy 06/17/2017   Anemia, iron deficiency    Benign neoplasm of ascending colon    Hemorrhoids    Portal hypertensive gastropathy ([name])    Gastritis and gastroduodenitis    Esophageal varices without bleeding ([name])    H/O: CVA (cerebrovascular accident) 12/01/2013   ACS (acute coronary syndrome) ([name]) 12/01/2013   Anasarca 12/01/2013   Polysubstance abuse ([name]) 12/01/2013   CKD (chronic kidney disease) stage 3, GFR 30-59 ml/min ([name]) 12/01/2013   S/P right TKA 05/02/2013   Murmur 04/14/2013   Right inguinal hernia 12/26/2010   Cirrhosis, alcoholic ([name]) 12/20/2010    ONSET DATE: 02/10/2023 prosthesis delivery  REFERRING DIAG: Z89.611 (ICD-10-CM) - Hx of AKA (above knee amputation), right   THERAPY DIAG:  Other abnormalities of gait and mobility  Repeated falls  Unsteadiness on feet  Muscle weakness (generalized)  Stiffness of right hip, not elsewhere classified  Rationale for Evaluation and Treatment: Rehabilitation  SUBJECTIVE:   SUBJECTIVE STATEMENT: He has been doing the 4 count exercise with the cards.  No falls.    PERTINENT HISTORY: right TFA 11/07/22, inguinal hernia repair BLEs,  ETOH abuse, cirrhosis, arthritis, GERD, HTN, HLD, heart murmur, MI, TIA 2011, systemic inflammatory response syndrome  PAIN:  Are you having pain? Yes: NPRS scale: today 5/10 pain today Pain location: right residual limb more distal lateral Pain description: sharp Aggravating factors: unknown Relieving factors:  pain pill, sit to rest  PRECAUTIONS: Fall  WEIGHT BEARING RESTRICTIONS: No  FALLS: Has patient fallen in last 6 months? Yes. Number of falls 3 times no injuries. Off balance  LIVING ENVIRONMENT: Lives with: lives with their spouse and dog 60#  Lives in: House Home Access: Stairs to enter Home layout: One level Stairs: Yes: External: 2 steps to porch without rails and threshold into house Has following equipment at home: Single point cane, Environmental consultant -  2 wheeled, Chief Operating Officer, Wheelchair (manual), and Tour manager  OCCUPATION:   on disability. 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   OBJECTIVE:  COGNITION: Evaluation on 04/13/2023:  pt appeared high and was slow answering questions.  Evaluation on 02/16/2023:  Overall cognitive status: Within functional limits for tasks assessed  POSTURE: Evaluation on 02/16/2023: rounded shoulders, forward head, flexed trunk , and weight shift left  LOWER EXTREMITY ROM:  ROM P:passive  A:active Right Eval 02/16/23  Hip flexion   Hip extension Thomas -9*  Hip abduction   Hip adduction   Hip internal rotation   Hip external rotation   Knee flexion   Knee extension   Ankle dorsiflexion   Ankle plantarflexion   Ankle inversion   Ankle eversion    (Blank rows = not tested)  LOWER EXTREMITY MMT:  MMT Right Eval 02/16/23 Left Eval 02/16/23  Hip flexion 4/5 5/5  Hip extension 3/5 4/5  Hip abduction 3+/5 4/5  Hip adduction    Hip internal rotation    Hip external rotation    Knee flexion  5/5  Knee extension  5/5  Ankle dorsiflexion  5/5  Ankle plantarflexion    Ankle inversion    Ankle eversion    At Evaluation all strength testing is grossly seated and functionally standing / gait. (Blank rows = not tested)  BED MOBILITY:  Evaluation on 02/16/2023:  Modified independent supine <> sit  TRANSFERS: 06/16/2023: sit to stand 18" chair with armrests to axillary crutches with verbal cues on technique.   04/13/2023:  sit to stand pulling up on //bars with supervision.  Pt unable to control prosthetic knee.   03/30/2023: Sit to stand: SBA requires use of armrests from stable chair and external support for stabilization Stand to sit: SBA  requires use or armrests on stable chair and sturdy chair.   FUNCTIONAL TESTs:  06/16/2023: pt able to pick up item from floor with CGA and PT instructions for technique.   05/18/2023:  Standing at sink with one hand hovering for safety: looking over shoulders to right & left and reaching forward 7" to tap back wall.  PT cued verbal & tactile on equal weight bearing.    04/15/2023:  patient requires minA for static balance 60 seconds.    GAIT: 06/16/2023: pt amb >100' with axillary crutches with supervision with 4-point pattern with PT cues on sequencing.  4-point pattern facilitates step through pattern which engages prosthesis better and counter trunk rotation for balance.   05/18/2023: Pt amb 100' with  axillary crutches with supervision / verbal cues for pelvic orientation and upright posture.  Prosthetic knee was stable in stance with changes from prosthetist.   Pt neg 12* incline with crutches with supervision with difficulty weight shifting over prosthesis in stance. PT demo option of side stepping on ramp or incline if too steep.  Pt able to side step on 12* incline with supervision and pt reports easier.   Pt neg 6.5" curb with axillary crutches with supervision.    04/15/2023: Gait pattern: step to pattern, decreased step length- Left, decreased stance time- Right, decreased hip/knee flexion- Right, circumduction- Right, Right hip hike, antalgic, trendelenburg, lateral hip instability, trunk flexed, abducted- Right, and poor foot clearance- Right POOR weight shift onto prosthesis in stance,  Distance walked: 100' Assistive device utilized: Crutches and TFA prosthesis Level of assistance: SBA with crutches   03/30/2023: Gait pattern: step to pattern, decreased step length- Left, decreased stance time- Right, decreased hip/knee flexion- Right, circumduction- Right, Right hip hike, antalgic, trendelenburg, lateral hip instability, trunk flexed, abducted- Right, and poor foot clearance-  Right Distance walked: 100' Assistive device utilized: Crutches and TFA prosthesis Level of assistance: SBA with crutches Comments: pt amb 3' with HHA maxA   CURRENT PROSTHETIC WEAR ASSESSMENT: 05/18/2023:  pt verbalizes proper donning & cleaning of prosthesis. Pt reports wear >10 hours without skin issues.    Evaluation on 02/16/2023:  Patient is dependent with: skin check, residual limb care, care of non-amputated limb, prosthetic cleaning, ply sock cleaning, correct ply sock adjustment, proper wear schedule/adjustment, and proper weight-bearing schedule/adjustment Donning prosthesis: SBA Doffing prosthesis: SBA Prosthetic wear tolerance: 2-8 hours, 1x/day5 of 6 days since delivery Prosthetic weight bearing tolerance: 5 minutes with limb pain 7/10 Edema: pitting edema Residual limb condition: no open areas, normal color, temperature & moisture, scar is mobile with slight invagination. Prosthetic description: silicon liner with velcro landyard, ischial containment socket with flexible inner liner, multi-axial hydraulic knee, dynamic response foot K code/activity level with prosthetic use: Level 3    TODAY'S TREATMENT:                                                                                                                             DATE:  06/16/2023: Prosthetic Training with Transfemoral Prosthesis:  PT cued on sit to stand in lobby as he was unstable upon arising.  Pt amb 75' X 3 with 4 point pattern with crutches with PT cues on sequencing.  Pt amb inside //bars with LUE support 8' X 4 with minA. Progressed to turning 90* then ambulating inside //bars X 6 reps.   Neuromuscular Re-education:  Side stepping in //bars initial rep with BUE support then progressed to LUE only for 2 reps.  Walking backwards in //bars BUE support X 4 reps with cues.  Reaching BUEs to 18" chair seat lightly touching to upright without UE support 10 reps with PT CGA to SBA.  PT demo & verbal cues  on picking  up items from floor with TFA prosthesis.  Pt able to pick up item without UE support X 3 reps with CGA.    TREATMENT:                                                                                                                             DATE:  06/11/2023: Prosthetic Training with Transfemoral Prosthesis:  He arrives with TFA prosthesis rotated so PT again reviewed tightening suspension strap and sometimes may been to remove and rotate back to neural then reapply strap.  PT instructed in 4 point movement pattern. PT demo 4 point gait pattern then had hem return demonstrate with P amb 50' X 4 with crutches using 4 point pattern with minA and constant PT cues for sequencing, upright trunk and proper step length for speed ambulating.  Stairs in clinic hall with one hand rail  on left and crutches in right hand to simulate home as he plans to get handrail installed on left side going up to enter home. Able to perform with CGA after cues and demo for technique.  Neuromuscular Re-education: Working on standing balance: UE resistive exercises for core stabilization with weight bearing thru BLEs - green theraband alternating UEs and BUEs 10 reps ea - row, forward reach and biceps curls.  All with PT tactile & verbal cues for upright posture and minA for balance.  06/09/2023: Prosthetic Training with Transfemoral Prosthesis:  PT reviewed tightening suspension strap. When strap is loose, he will have poor control and socket may rotate.  PT reviewed goal for wear is all awake hours to enable mobility weather he is in his home or going out.  Pt verbalized understanding.  PT instructed in 4 point movement pattern using numbered cards while seated to get rhythm. PT demo how 4 point gait pattern facilitates trunk rotation and step through pattern that should improve his balance and prosthesis control during gait.  Pt performed 10 reps of seated 4 point pattern 2 sets.   Pt amb 50' with crutches using  4 point pattern with minA and constant PT cues for sequencing, upright trunk and proper step length for speed ambulating.  Pt amb 50' with 2 canes stand alone tip with minA using 4 point pattern and constant PT cues for sequencing, upright trunk and proper step length for speed ambulating.  Neuromuscular Re-education: Working on standing balance: UE resistive exercises for core stabilization with weight bearing thru BLEs - green theraband alternating UEs and BUEs 10 reps ea - row, forward reach and biceps curls.  All with PT tactile & verbal cues for upright posture and minA for balance.     HOME EXERCISE PROGRAM:  ASSESSMENT:  CLINICAL IMPRESSION: Pt has made significant progress with prosthetic wear / care, balance and gait in first 10 PT visits.  He is currently ambulating with 4-point pattern with PT cues with supervision.  Patient would benefit from skilled PT to further improve his mobility.  OBJECTIVE IMPAIRMENTS: Abnormal gait, decreased activity tolerance, decreased balance, decreased endurance, decreased knowledge of condition, decreased knowledge of use of DME, decreased mobility, difficulty walking, decreased ROM, decreased strength, decreased safety awareness, postural dysfunction, prosthetic dependency , and pain.   ACTIVITY LIMITATIONS: carrying, lifting, standing, stairs, transfers, and locomotion level  PARTICIPATION LIMITATIONS: meal prep, cleaning, driving, community activity, occupation, and yard work  PERSONAL FACTORS: Education, Fitness, Past/current experiences, Time since onset of injury/illness/exacerbation, and 3+ comorbidities: see PMH  are also affecting patient's functional outcome.   REHAB POTENTIAL: Good  CLINICAL DECISION MAKING: Evolving/moderate complexity  EVALUATION COMPLEXITY: Moderate   GOALS: Goals reviewed with patient? Yes  SHORT TERM GOALS: Target date: 05/14/2023  Patient donnes prosthesis modified independent & verbalizes proper  cleaning. Baseline: SEE OBJECTIVE DATA Goal status: MET 05/18/2023 2.  Patient tolerates prosthesis >10 hrs total /day without skin issues or limb pain >5/10 after standing. Baseline: SEE OBJECTIVE DATA Goal status: MET 05/18/2023  3.  Patient able to reach 7" and look over both shoulders without UE support with supervision. Baseline: SEE OBJECTIVE DATA Goal status: MET 05/18/2023  4. Patient ambulates 200' with crutches & prosthesis with supervision / verbal cues only. Baseline: SEE OBJECTIVE DATA Goal status: MET 05/18/2023  5. Patient negotiates ramps & curbs with crutches & prosthesis with supervision. Baseline: SEE OBJECTIVE DATA Goal status: MET 05/18/2023  LONG TERM GOALS: Target date: 07/09/2023  Patient demonstrates & verbalized understanding of prosthetic care to enable safe utilization of prosthesis. Baseline: SEE OBJECTIVE DATA Goal status: Ongoing 06/16/2023  Patient tolerates prosthesis wear >90% of awake hours without skin or limb pain issues. Baseline: SEE OBJECTIVE DATA Goal status: Ongoing 06/16/2023  Berg Balance >36/56 to indicate lower fall risk Baseline: SEE OBJECTIVE DATA Goal status: Ongoing 06/16/2023  Patient ambulates >300' with prosthesis and cane or less independently Baseline: SEE OBJECTIVE DATA Goal status: Ongoing 06/16/2023  Patient negotiates ramps, curbs & stairs with single rail with prosthesis and cane independently. Baseline: SEE OBJECTIVE DATA Goal status: Ongoing 06/16/2023    PLAN:  PT FREQUENCY: 2x/week  PT DURATION: 13 weeks / 90 days  PLANNED INTERVENTIONS: [id number]- PT Re-evaluation, 97110-Therapeutic exercises, 97530- Therapeutic activity, 97112- Neuromuscular re-education, 97535- Self Care, [id number]- Manual therapy, [phone]- Gait training, [phone]- Prosthetic training, Patient/Family education, Balance training, Stair training, Vestibular training, DME instructions, Therapeutic exercises, Therapeutic activity, Neuromuscular re-education, Gait  training, and Self Care  PLAN FOR NEXT SESSION:   continue prosthetic training for balance & gait 4 point pattern with crutches and with canes,  work on ramps & curbs. Balance & gait with LUE inside //bars.   Vladimir Faster, PT, DPT 06/16/2023, 3:34 PM

## 2023-06-24 ENCOUNTER — Encounter: Payer: Medicare HMO | Admitting: Physical Therapy

## 2023-07-01 ENCOUNTER — Encounter: Payer: Self-pay | Admitting: Physical Therapy

## 2023-07-01 ENCOUNTER — Ambulatory Visit (INDEPENDENT_AMBULATORY_CARE_PROVIDER_SITE_OTHER): Payer: Medicare HMO | Admitting: Physical Therapy

## 2023-07-01 DIAGNOSIS — R2689 Other abnormalities of gait and mobility: Secondary | ICD-10-CM

## 2023-07-01 DIAGNOSIS — M25651 Stiffness of right hip, not elsewhere classified: Secondary | ICD-10-CM | POA: Diagnosis not present

## 2023-07-01 DIAGNOSIS — R296 Repeated falls: Secondary | ICD-10-CM | POA: Diagnosis not present

## 2023-07-01 DIAGNOSIS — M6281 Muscle weakness (generalized): Secondary | ICD-10-CM | POA: Diagnosis not present

## 2023-07-01 DIAGNOSIS — R2681 Unsteadiness on feet: Secondary | ICD-10-CM

## 2023-07-01 NOTE — Therapy (Addendum)
 OUTPATIENT PHYSICAL THERAPY PROSTHETIC TREATMENT   Patient Name: Mark [name]. MRN: [id number] DOB:Mar 14, 1956, 68 y.o., male Today's Date: 07/01/2023  PCP: Clotilda Single, MD REFERRING PROVIDER: Jerona Sage, MD  PHYSICAL THERAPY DISCHARGE SUMMARY  Visits from Start of Care: 11  Current functional level related to goals / functional outcomes: Unknown as patient has not returned since last visit   Remaining deficits: Unknown as patient has not returned since last visit   Education / Equipment: Patient was instructed in prosthetic care and an HEP which he appeared to understand.  Patient agrees to discharge. Patient goals were not met. Patient is being discharged due to not returning since the last visit.   Grayce Spatz, PT, DPT 12/07/2023, 12:13 PM    END OF SESSION:  PT End of Session - 07/01/23 1427     Visit Number 11    Number of Visits 25    Date for PT Re-Evaluation 07/09/23    Authorization Type [employer] Medicare HMO/PPO and [employer] PPO    Progress Note Due on Visit 20    PT Start Time 1428    PT Stop Time 1514    PT Time Calculation (min) 46 min    Equipment Utilized During Treatment Gait belt    Activity Tolerance Patient tolerated treatment well;Patient limited by fatigue    Behavior During Therapy WFL for tasks assessed/performed;Flat affect                       Past Medical History:  Diagnosis Date   Alcohol  abuse    Anemia    Arthritis    Ascites    Cirrhosis ([name])    Coffee ground emesis    Dehydration 06/17/2017   Febrile illness    GERD (gastroesophageal reflux disease)    Heart murmur    History of blood transfusion    Hyperlipidemia    Hypertension    Leg swelling    Myocardial infarction ([name]) 2012   Preop cardiovascular exam 04/14/2013   Sepsis ([name]) 06/17/2017   Septic shock ([name]) 06/18/2017   SIRS (systemic inflammatory response syndrome) ([name]) 07/11/2017   Stroke ([name]) 10/2009   TIA    Thrombocytopenia ([name])    Past Surgical History:  Procedure Laterality Date   AMPUTATION Right 11/07/2022   Procedure: RIGHT ABOVE KNEE AMPUTATION;  Surgeon: Sage Jerona GAILS, MD;  Location: [facility] OR;  Service: Orthopedics;  Laterality: Right;   COLONOSCOPY WITH PROPOFOL  N/A 02/07/2016   Procedure: COLONOSCOPY WITH PROPOFOL ;  Surgeon: Toribio SHAUNNA Cedar, MD;  Location: [facility];  Service: Endoscopy;  Laterality: N/A;   ERCP N/A 02/28/2023   Procedure: ENDOSCOPIC RETROGRADE CHOLANGIOPANCREATOGRAPHY (ERCP);  Surgeon: Rollin Dover, MD;  Location: [facility];  Service: Gastroenterology;  Laterality: N/A;   ESOPHAGOGASTRODUODENOSCOPY (EGD) WITH PROPOFOL  N/A 02/07/2016   Procedure: ESOPHAGOGASTRODUODENOSCOPY (EGD) WITH PROPOFOL ;  Surgeon: Toribio SHAUNNA Cedar, MD;  Location: [facility];  Service: Endoscopy;  Laterality: N/A;   ESOPHAGOGASTRODUODENOSCOPY (EGD) WITH PROPOFOL  N/A 06/22/2017   Procedure: ESOPHAGOGASTRODUODENOSCOPY (EGD) WITH PROPOFOL ;  Surgeon: Albertus Gordy HERO, MD;  Location: [facility] END[facility]COPY;  Service: Gastroenterology;  Laterality: N/A;   EXCISIONAL TOTAL KNEE ARTHROPLASTY WITH ANTIBIOTIC SPACERS Right 03/20/2020   Procedure: Resection right total knee arthroplasty and placement of antibiotic spacer;  Surgeon: Ernie Cough, MD;  Location: [facility];  Service: Orthopedics;  Laterality: Right;  90 mins   HERNIA REPAIR Right    inguinal   INGUINAL HERNIA REPAIR Left 02/08/2021   Procedure: OPEN  LEFT INGUINAL HERNIA REPAIR WITH MESH;  Surgeon: Vernetta Berg, MD;  Location: [facility] OR;  Service: General;  Laterality: Left;   IRRIGATION AND DEBRIDEMENT KNEE Right 11/13/2020   Procedure: IRRIGATION AND DEBRIDEMENT KNEE;  Surgeon: Ernie Cough, MD;  Location: [facility];  Service: Orthopedics;  Laterality: Right;   JOINT REPLACEMENT     KNEE ARTHROSCOPY     bilateral/  12/14   REIMPLANTATION OF TOTAL KNEE Right 08/02/2020   Procedure: REIMPLANTATION/REVISION OF TOTAL KNEE WITH REMOVAL OF ANTIBIOTIC SPACER;   Surgeon: Ernie Cough, MD;  Location: [facility];  Service: Orthopedics;  Laterality: Right;    REMOVAL OF STONES  02/28/2023   Procedure: REMOVAL OF DEBRIS;  Surgeon: Rollin Dover, MD;  Location: [facility];  Service: Gastroenterology;;   ANNETT  02/28/2023   Procedure: ANNETT;  Surgeon: Rollin Dover, MD;  Location: [facility];  Service: Gastroenterology;;   TEE WITHOUT CARDIOVERSION N/A 05/13/2018   Procedure: TRANSESOPHAGEAL ECHOCARDIOGRAM (TEE);  Surgeon: Loni Soyla LABOR, MD;  Location: [facility] ENDOSCOPY;  Service: Cardiovascular;  Laterality: N/A;   TOTAL KNEE ARTHROPLASTY Right 05/02/2013   Procedure: RIGHT TOTAL KNEE ARTHROPLASTY;  Surgeon: Cough JONETTA Ernie, MD;  Location: [facility];  Service: Orthopedics;  Laterality: Right;   Patient Active Problem List   Diagnosis Date Noted   Acute blood loss anemia 03/02/2023   Choledocholithiasis 02/28/2023   HTN (hypertension) 02/28/2023   Hypokalemia 02/28/2023   Cholelithiasis with biliary obstruction 02/27/2023   Infection of total knee replacement ([name]) 11/07/2022   Hx of AKA (above knee amputation), right ([name]) 11/07/2022   Cerebrovascular disease 07/08/2022   Renal cyst 07/08/2022   Protein-calorie malnutrition, severe 07/08/2022   Toxic metabolic encephalopathy 02/26/2022   Septic joint of right knee joint ([name]) 11/08/2020   S/P right TKA reimplantation 08/02/2020   Infection of total left knee replacement ([name]) 03/20/2020   Cirrhosis of liver without ascites ([name])    AKI (acute kidney injury) ([name])    Bacteremia    Spinal stenosis of lumbar region without neurogenic claudication    Aspiration pneumonia ([name]) 09/24/2018   Abscess in epidural space of lumbar spine    MRSA bacteremia 09/21/2018   Infection of prosthetic right knee joint ([name]) 09/20/2018   Anemia of chronic disease 09/20/2018   Hypoalbuminemia 09/20/2018   Hypoglycemia without diagnosis of diabetes mellitus 09/20/2018   Epidural abscess  09/20/2018   Hyperkalemia 05/18/2018   Edentulous 05/11/2018   Pancytopenia ([name]) 05/10/2018   CAD (coronary artery disease) 05/10/2018   Hepatic encephalopathy ([name]) 05/10/2018   Alcohol  abuse 05/10/2018   GERD (gastroesophageal reflux disease) 05/10/2018   Duodenal ulcer    Renal failure    Acute on chronic anemia    Hypotension 06/17/2017   Hyponatremia 06/17/2017   Leg edema, right 06/17/2017   Acute metabolic encephalopathy 06/17/2017   Anemia, iron  deficiency    Benign neoplasm of ascending colon    Hemorrhoids    Portal hypertensive gastropathy ([name])    Gastritis and gastroduodenitis    Esophageal varices without bleeding ([name])    H/O: CVA (cerebrovascular accident) 12/01/2013   ACS (acute coronary syndrome) ([name]) 12/01/2013   Anasarca 12/01/2013   Polysubstance abuse ([name]) 12/01/2013   CKD (chronic kidney disease) stage 3, GFR 30-59 ml/min ([name]) 12/01/2013   S/P right TKA 05/02/2013   Murmur 04/14/2013   Right inguinal hernia 12/26/2010   Cirrhosis, alcoholic ([name]) 12/20/2010    ONSET DATE: 02/10/2023 prosthesis delivery  REFERRING DIAG: Z89.611 (ICD-10-CM) - Hx of AKA (above knee  amputation), right   THERAPY DIAG:  Other abnormalities of gait and mobility  Repeated falls  Unsteadiness on feet  Muscle weakness (generalized)  Stiffness of right hip, not elsewhere classified  Rationale for Evaluation and Treatment: Rehabilitation  SUBJECTIVE:   SUBJECTIVE STATEMENT: He tried to wear prosthesis all awake hours but it made his limb really sore.   PERTINENT HISTORY: right TFA 11/07/22, inguinal hernia repair BLEs, ETOH abuse, cirrhosis, arthritis, GERD, HTN, HLD, heart murmur, MI, TIA 2011, systemic inflammatory response syndrome  PAIN:  Are you having pain? Yes: NPRS scale: today  5/10 pain today Pain location: right residual limb more distal lateral Pain description: sharp Aggravating factors: unknown Relieving factors:  pain pill, sit to  rest  PRECAUTIONS: Fall  WEIGHT BEARING RESTRICTIONS: No  FALLS: Has patient fallen in last 6 months? Yes. Number of falls 3 times no injuries. Off balance  LIVING ENVIRONMENT: Lives with: lives with their spouse and dog 60#  Lives in: [name] Access: Stairs to enter Home layout: One level Stairs: Yes: External: 2 steps to porch without rails and threshold into [name] Has following equipment at home: Single point cane, Environmental consultant - 2 wheeled, Chief Operating Officer, Wheelchair (manual), and Tour manager  OCCUPATION:   on disability. 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   OBJECTIVE:  COGNITION: Evaluation on 04/13/2023:  pt appeared high and was slow answering questions.  Evaluation on 02/16/2023:  Overall cognitive status: Within functional limits for tasks assessed  POSTURE: Evaluation on 02/16/2023: rounded shoulders, forward head, flexed trunk , and weight shift left  LOWER EXTREMITY ROM:  ROM P:passive  A:active Right Eval 02/16/23  Hip flexion   Hip extension Thomas -9*  Hip abduction   Hip adduction   Hip internal rotation   Hip external rotation   Knee flexion   Knee extension   Ankle dorsiflexion   Ankle plantarflexion   Ankle inversion   Ankle eversion    (Blank rows = not tested)  LOWER EXTREMITY MMT:  MMT Right Eval 02/16/23 Left Eval 02/16/23  Hip flexion 4/5 5/5  Hip extension 3/5 4/5  Hip abduction 3+/5 4/5  Hip adduction    Hip internal rotation    Hip external rotation    Knee flexion  5/5  Knee extension  5/5  Ankle dorsiflexion  5/5  Ankle plantarflexion    Ankle inversion    Ankle eversion    At Evaluation all strength testing is grossly seated and functionally standing / gait. (Blank rows = not  tested)  BED MOBILITY:  Evaluation on 02/16/2023:  Modified independent supine <> sit  TRANSFERS: 06/16/2023: sit to stand 18 chair with armrests to axillary crutches with verbal cues on technique.   04/13/2023:  sit to stand pulling up on //bars with supervision.  Pt unable to control prosthetic knee.   03/30/2023: Sit to stand: SBA requires use of armrests from stable chair and external support for stabilization Stand to sit: SBA requires use or armrests on stable chair and sturdy chair.   FUNCTIONAL TESTs:  06/16/2023: pt able to pick up item from floor with CGA and PT instructions for technique.   05/18/2023:  Standing at sink with one hand hovering for safety: looking over shoulders to right & left and reaching forward 7  to tap back wall.  PT cued verbal & tactile on equal weight bearing.    04/15/2023:  patient requires minA for static balance 60 seconds.    GAIT: 06/16/2023: pt amb >100' with axillary crutches with supervision with 4-point pattern with PT cues on sequencing.  4-point pattern facilitates step through pattern which engages prosthesis better and counter trunk rotation for balance.   05/18/2023: Pt amb 100' with axillary crutches with supervision / verbal cues for pelvic orientation and upright posture.  Prosthetic knee was stable in stance with changes from prosthetist.   Pt neg 12* incline with crutches with supervision with difficulty weight shifting over prosthesis in stance. PT demo option of side stepping on ramp or incline if too steep.  Pt able to side step on 12* incline with supervision and pt reports easier.   Pt neg 6.5 curb with axillary crutches with supervision.    04/15/2023: Gait pattern: step to pattern, decreased step length- Left, decreased stance time- Right, decreased hip/knee flexion- Right, circumduction- Right, Right hip hike, antalgic, trendelenburg, lateral hip instability, trunk flexed, abducted- Right, and poor foot clearance- Right POOR  weight shift onto prosthesis in stance,  Distance walked: 100' Assistive device utilized: Crutches and TFA prosthesis Level of assistance: SBA with crutches   03/30/2023: Gait pattern: step to pattern, decreased step length- Left, decreased stance time- Right, decreased hip/knee flexion- Right, circumduction- Right, Right hip hike, antalgic, trendelenburg, lateral hip instability, trunk flexed, abducted- Right, and poor foot clearance- Right Distance walked: 100' Assistive device utilized: Crutches and TFA prosthesis Level of assistance: SBA with crutches Comments: pt amb 3' with HHA maxA   CURRENT PROSTHETIC WEAR ASSESSMENT: 05/18/2023:  pt verbalizes proper donning & cleaning of prosthesis. Pt reports wear >10 hours without skin issues.    Evaluation on 02/16/2023:  Patient is dependent with: skin check, residual limb care, care of non-amputated limb, prosthetic cleaning, ply sock cleaning, correct ply sock adjustment, proper wear schedule/adjustment, and proper weight-bearing schedule/adjustment Donning prosthesis: SBA Doffing prosthesis: SBA Prosthetic wear tolerance: 2-8 hours, 1x/day5 of 6 days since delivery Prosthetic weight bearing tolerance: 5 minutes with limb pain 7/10 Edema: pitting edema Residual limb condition: no open areas, normal color, temperature & moisture, scar is mobile with slight invagination. Prosthetic description: silicon liner with velcro landyard, ischial containment socket with flexible inner liner, multi-axial hydraulic knee, dynamic response foot K code/activity level with prosthetic use: Level 3    TODAY'S TREATMENT:                                                                                                                             DATE:  07/01/2023: Prosthetic Training with Transfemoral Prosthesis:  PT reviewed need to tighten suspension strap during day.  PT assisted pt with fully tightening his strap.    PT set up appt with Chyrl Sharps,  prosthetist for Tuesday, 07/07/23 at 1:00. Pt to discuss pads in socket at his limb has shrunk, check  alignment and set up follow up appointment.   PT reviewed 4-point pattern seated.   Pt needs verbal cues for sequencing.  PT introduced forearm crutches: tactile & verbal cues for sit to/from stand with forearm crutches.  Pt amb 100' with constant cues for 4-point pattern.   Pt neg 12* ramp with forearm crutches with minA & verbal / manual cues for technique / sequence / weight shift.  Pt neg 6.5 curb with forearm crutches with minA & verbal cues on techqnique.  PT assisted pt with pricing forearm crutches as his insurance has already paid for walking device.       TREATMENT:                                                                                                                             DATE:  06/16/2023: Prosthetic Training with Transfemoral Prosthesis:  PT cued on sit to stand in lobby as he was unstable upon arising.  Pt amb 75' X 3 with 4 point pattern with crutches with PT cues on sequencing.  Pt amb inside //bars with LUE support 8' X 4 with minA. Progressed to turning 90* then ambulating inside //bars X 6 reps.   Neuromuscular Re-education:  Side stepping in //bars initial rep with BUE support then progressed to LUE only for 2 reps.  Walking backwards in //bars BUE support X 4 reps with cues.  Reaching BUEs to 18 chair seat lightly touching to upright without UE support 10 reps with PT CGA to SBA.  PT demo & verbal cues on picking up items from floor with TFA prosthesis.  Pt able to pick up item without UE support X 3 reps with CGA.    TREATMENT:                                                                                                                             DATE:  06/11/2023: Prosthetic Training with Transfemoral Prosthesis:  He arrives with TFA prosthesis rotated so PT again reviewed tightening suspension strap and sometimes may been to remove and rotate back  to neural then reapply strap.  PT instructed in 4 point movement pattern. PT demo 4 point gait pattern then had hem return demonstrate with P amb 50' X 4 with crutches using 4 point pattern with minA and constant PT cues for sequencing, upright trunk and proper step length for speed ambulating.  Stairs in clinic  hall with one hand rail  on left and crutches in right hand to simulate home as he plans to get handrail installed on left side going up to enter home. Able to perform with CGA after cues and demo for technique.  Neuromuscular Re-education: Working on standing balance: UE resistive exercises for core stabilization with weight bearing thru BLEs - green theraband alternating UEs and BUEs 10 reps ea - row, forward reach and biceps curls.  All with PT tactile & verbal cues for upright posture and minA for balance.    HOME EXERCISE PROGRAM:  ASSESSMENT:  CLINICAL IMPRESSION: PT began working with forearm crutches for prosthetic gait.  He was more comfortable with forearm crutches vs axillary crutches.   Patient would benefit from skilled PT to further improve his mobility.    OBJECTIVE IMPAIRMENTS: Abnormal gait, decreased activity tolerance, decreased balance, decreased endurance, decreased knowledge of condition, decreased knowledge of use of DME, decreased mobility, difficulty walking, decreased ROM, decreased strength, decreased safety awareness, postural dysfunction, prosthetic dependency , and pain.   ACTIVITY LIMITATIONS: carrying, lifting, standing, stairs, transfers, and locomotion level  PARTICIPATION LIMITATIONS: meal prep, cleaning, driving, community activity, occupation, and yard work  PERSONAL FACTORS: Education, Fitness, Past/current experiences, Time since onset of injury/illness/exacerbation, and 3+ comorbidities: see PMH are also affecting patient's functional outcome.   REHAB POTENTIAL: Good  CLINICAL DECISION MAKING: Evolving/moderate complexity  EVALUATION  COMPLEXITY: Moderate   GOALS: Goals reviewed with patient? Yes  SHORT TERM GOALS: Target date: 05/14/2023  Patient donnes prosthesis modified independent & verbalizes proper cleaning. Baseline: SEE OBJECTIVE DATA Goal status: MET 05/18/2023 2.  Patient tolerates prosthesis >10 hrs total /day without skin issues or limb pain >5/10 after standing. Baseline: SEE OBJECTIVE DATA Goal status: MET 05/18/2023  3.  Patient able to reach 7 and look over both shoulders without UE support with supervision. Baseline: SEE OBJECTIVE DATA Goal status: MET 05/18/2023  4. Patient ambulates 200' with crutches & prosthesis with supervision / verbal cues only. Baseline: SEE OBJECTIVE DATA Goal status: MET 05/18/2023  5. Patient negotiates ramps & curbs with crutches & prosthesis with supervision. Baseline: SEE OBJECTIVE DATA Goal status: MET 05/18/2023  LONG TERM GOALS: Target date: 07/09/2023  Patient demonstrates & verbalized understanding of prosthetic care to enable safe utilization of prosthesis. Baseline: SEE OBJECTIVE DATA Goal status: Ongoing 07/01/2023  Patient tolerates prosthesis wear >90% of awake hours without skin or limb pain issues. Baseline: SEE OBJECTIVE DATA Goal status: Ongoing  07/01/2023  Berg Balance >36/56 to indicate lower fall risk Baseline: SEE OBJECTIVE DATA Goal status: Ongoing  07/01/2023  Patient ambulates >300' with prosthesis and cane or less independently Baseline: SEE OBJECTIVE DATA Goal status: Ongoing  07/01/2023  Patient negotiates ramps, curbs & stairs with single rail with prosthesis and cane independently. Baseline: SEE OBJECTIVE DATA Goal status: Ongoing  07/01/2023    PLAN:  PT FREQUENCY: 2x/week  PT DURATION: 13 weeks / 90 days  PLANNED INTERVENTIONS: [id number]- PT Re-evaluation, 97110-Therapeutic exercises, 97530- Therapeutic activity, 97112- Neuromuscular re-education, 97535- Self Care, [id number]- Manual therapy, [phone]- Gait training, [phone]- Prosthetic  training, Patient/Family education, Balance training, Stair training, Vestibular training, DME instructions, Therapeutic exercises, Therapeutic activity, Neuromuscular re-education, Gait training, and Self Care  PLAN FOR NEXT SESSION:   check LTGs next week  Grayce Spatz, PT, DPT 07/01/2023, 3:26 PM

## 2023-07-08 ENCOUNTER — Telehealth: Payer: Self-pay | Admitting: Physical Therapy

## 2023-07-08 ENCOUNTER — Encounter: Payer: Medicare HMO | Admitting: Physical Therapy

## 2023-07-08 NOTE — Therapy (Deleted)
 OUTPATIENT PHYSICAL THERAPY PROSTHETIC TREATMENT   Patient Name: Mark [name]. MRN: [id number] DOB:August 19, 1955, 68 y.o., male Today's Date: 07/08/2023  PCP: Abbe Amsterdam, MD REFERRING PROVIDER: Aldean Baker, MD  END OF SESSION:              Past Medical History:  Diagnosis Date   Alcohol abuse    Anemia    Arthritis    Ascites    Cirrhosis ([name])    Coffee ground emesis    Dehydration 06/17/2017   Febrile illness    GERD (gastroesophageal reflux disease)    Heart murmur    History of blood transfusion    Hyperlipidemia    Hypertension    Leg swelling    Myocardial infarction ([name]) 2012   Preop cardiovascular exam 04/14/2013   Sepsis ([name]) 06/17/2017   Septic shock ([name]) 06/18/2017   SIRS (systemic inflammatory response syndrome) ([name]) 07/11/2017   Stroke ([name]) 10/2009   TIA   Thrombocytopenia ([name])    Past Surgical History:  Procedure Laterality Date   AMPUTATION Right 11/07/2022   Procedure: RIGHT ABOVE KNEE AMPUTATION;  Surgeon: Nadara Mustard, MD;  Location: [facility] OR;  Service: Orthopedics;  Laterality: Right;   COLONOSCOPY WITH PROPOFOL N/A 02/07/2016   Procedure: COLONOSCOPY WITH PROPOFOL;  Surgeon: Rachael Fee, MD;  Location: [facility] ENDOSCOPY;  Service: Endoscopy;  Laterality: N/A;   ERCP N/A 02/28/2023   Procedure: ENDOSCOPIC RETROGRADE CHOLANGIOPANCREATOGRAPHY (ERCP);  Surgeon: Jeani Hawking, MD;  Location: [facility];  Service: Gastroenterology;  Laterality: N/A;   ESOPHAGOGASTRODUODENOSCOPY (EGD) WITH PROPOFOL N/A 02/07/2016   Procedure: ESOPHAGOGASTRODUODENOSCOPY (EGD) WITH PROPOFOL;  Surgeon: Rachael Fee, MD;  Location: [facility];  Service: Endoscopy;  Laterality: N/A;   ESOPHAGOGASTRODUODENOSCOPY (EGD) WITH PROPOFOL N/A 06/22/2017   Procedure: ESOPHAGOGASTRODUODENOSCOPY (EGD) WITH PROPOFOL;  Surgeon: Beverley Fiedler, MD;  Location: [facility] ENDOSCOPY;  Service: Gastroenterology;  Laterality: N/A;   EXCISIONAL TOTAL KNEE ARTHROPLASTY WITH  ANTIBIOTIC SPACERS Right 03/20/2020   Procedure: Resection right total knee arthroplasty and placement of antibiotic spacer;  Surgeon: Durene Romans, MD;  Location: [facility] ORS;  Service: Orthopedics;  Laterality: Right;  90 mins   HERNIA REPAIR Right    inguinal   INGUINAL HERNIA REPAIR Left 02/08/2021   Procedure: OPEN LEFT INGUINAL HERNIA REPAIR WITH MESH;  Surgeon: Abigail Miyamoto, MD;  Location: [facility] OR;  Service: General;  Laterality: Left;   IRRIGATION AND DEBRIDEMENT KNEE Right 11/13/2020   Procedure: IRRIGATION AND DEBRIDEMENT KNEE;  Surgeon: Durene Romans, MD;  Location: [facility] ORS;  Service: Orthopedics;  Laterality: Right;   JOINT REPLACEMENT     KNEE ARTHROSCOPY     bilateral/  12/14   REIMPLANTATION OF TOTAL KNEE Right 08/02/2020   Procedure: REIMPLANTATION/REVISION OF TOTAL KNEE WITH REMOVAL OF ANTIBIOTIC SPACER;  Surgeon: Durene Romans, MD;  Location: [facility];  Service: Orthopedics;  Laterality: Right;    REMOVAL OF STONES  02/28/2023   Procedure: REMOVAL OF DEBRIS;  Surgeon: Jeani Hawking, MD;  Location: [facility];  Service: Gastroenterology;;   Dennison Mascot  02/28/2023   Procedure: Dennison Mascot;  Surgeon: Jeani Hawking, MD;  Location: [facility];  Service: Gastroenterology;;   TEE WITHOUT CARDIOVERSION N/A 05/13/2018   Procedure: TRANSESOPHAGEAL ECHOCARDIOGRAM (TEE);  Surgeon: Parke Poisson, MD;  Location: [facility] ENDOSCOPY;  Service: Cardiovascular;  Laterality: N/A;   TOTAL KNEE ARTHROPLASTY Right 05/02/2013   Procedure: RIGHT TOTAL KNEE ARTHROPLASTY;  Surgeon: Shelda Pal, MD;  Location: [facility] OR[facility];  Service: Orthopedics;  Laterality: Right;  Patient Active Problem List   Diagnosis Date Noted   Acute blood loss anemia 03/02/2023   Choledocholithiasis 02/28/2023   HTN (hypertension) 02/28/2023   Hypokalemia 02/28/2023   Cholelithiasis with biliary obstruction 02/27/2023   Infection of total knee replacement ([name]) 11/07/2022   Hx of AKA (above knee amputation),  right ([name]) 11/07/2022   Cerebrovascular disease 07/08/2022   Renal cyst 07/08/2022   Protein-calorie malnutrition, severe 07/08/2022   Toxic metabolic encephalopathy 02/26/2022   Septic joint of right knee joint ([name]) 11/08/2020   S/P right TKA reimplantation 08/02/2020   Infection of total left knee replacement ([name]) 03/20/2020   Cirrhosis of liver without ascites ([name])    AKI (acute kidney injury) ([name])    Bacteremia    Spinal stenosis of lumbar region without neurogenic claudication    Aspiration pneumonia ([name]) 09/24/2018   Abscess in epidural space of lumbar spine    MRSA bacteremia 09/21/2018   Infection of prosthetic right knee joint ([name]) 09/20/2018   Anemia of chronic disease 09/20/2018   Hypoalbuminemia 09/20/2018   Hypoglycemia without diagnosis of diabetes mellitus 09/20/2018   Epidural abscess 09/20/2018   Hyperkalemia 05/18/2018   Edentulous 05/11/2018   Pancytopenia ([name]) 05/10/2018   CAD (coronary artery disease) 05/10/2018   Hepatic encephalopathy ([name]) 05/10/2018   Alcohol abuse 05/10/2018   GERD (gastroesophageal reflux disease) 05/10/2018   Duodenal ulcer    Renal failure    Acute on chronic anemia    Hypotension 06/17/2017   Hyponatremia 06/17/2017   Leg edema, right 06/17/2017   Acute metabolic encephalopathy 06/17/2017   Anemia, iron deficiency    Benign neoplasm of ascending colon    Hemorrhoids    Portal hypertensive gastropathy ([name])    Gastritis and gastroduodenitis    Esophageal varices without bleeding ([name])    H/O: CVA (cerebrovascular accident) 12/01/2013   ACS (acute coronary syndrome) ([name]) 12/01/2013   Anasarca 12/01/2013   Polysubstance abuse ([name]) 12/01/2013   CKD (chronic kidney disease) stage 3, GFR 30-59 ml/min ([name]) 12/01/2013   S/P right TKA 05/02/2013   Murmur 04/14/2013   Right inguinal hernia 12/26/2010   Cirrhosis, alcoholic ([name]) 12/20/2010    ONSET DATE: 02/10/2023 prosthesis delivery  REFERRING DIAG: Z89.611  (ICD-10-CM) - Hx of AKA (above knee amputation), right   THERAPY DIAG:  No diagnosis found.  Rationale for Evaluation and Treatment: Rehabilitation  SUBJECTIVE:   SUBJECTIVE STATEMENT: ***   He tried to wear prosthesis all awake hours but it made his limb really sore.   PERTINENT HISTORY: right TFA 11/07/22, inguinal hernia repair BLEs, ETOH abuse, cirrhosis, arthritis, GERD, HTN, HLD, heart murmur, MI, TIA 2011, systemic inflammatory response syndrome  PAIN:  Are you having pain? Yes: NPRS scale: today  ***  5/10 pain today Pain location: right residual limb more distal lateral Pain description: sharp Aggravating factors: unknown Relieving factors:  pain pill, sit to rest  PRECAUTIONS: Fall  WEIGHT BEARING RESTRICTIONS: No  FALLS: Has patient fallen in last 6 months? Yes. Number of falls 3 times no injuries. Off balance  LIVING ENVIRONMENT: Lives with: lives with their spouse and dog 60#  Lives in: House Home Access: Stairs to enter Home layout: One level Stairs: Yes: External: 2 steps to porch without rails and threshold into house Has following equipment at home: Single point cane, Environmental consultant - 2 wheeled, Chief Operating Officer, Wheelchair (manual), and Tour manager  OCCUPATION:   on disability. 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   OBJECTIVE:  COGNITION: Evaluation on 04/13/2023:  pt appeared high and was slow answering questions.  Evaluation on 02/16/2023:  Overall cognitive status: Within functional limits for tasks assessed  POSTURE: Evaluation on 02/16/2023: rounded shoulders, forward head, flexed trunk , and weight shift left  LOWER EXTREMITY ROM:  ROM P:passive  A:active Right Eval 02/16/23  Hip flexion   Hip extension  Thomas -9*  Hip abduction   Hip adduction   Hip internal rotation   Hip external rotation   Knee flexion   Knee extension   Ankle dorsiflexion   Ankle plantarflexion   Ankle inversion   Ankle eversion    (Blank rows = not tested)  LOWER EXTREMITY MMT:  MMT Right Eval 02/16/23 Left Eval 02/16/23  Hip flexion 4/5 5/5  Hip extension 3/5 4/5  Hip abduction 3+/5 4/5  Hip adduction    Hip internal rotation    Hip external rotation    Knee flexion  5/5  Knee extension  5/5  Ankle dorsiflexion  5/5  Ankle plantarflexion    Ankle inversion    Ankle eversion    At Evaluation all strength testing is grossly seated and functionally standing / gait. (Blank rows = not tested)  BED MOBILITY:  Evaluation on 02/16/2023:  Modified independent supine <> sit  TRANSFERS: 06/16/2023: sit to stand 18" chair with armrests to axillary crutches with verbal cues on technique.   04/13/2023:  sit to stand pulling up on //bars with supervision.  Pt unable to control prosthetic knee.   03/30/2023: Sit to stand: SBA requires use of armrests from stable chair and external support for stabilization Stand to sit: SBA requires use or armrests on stable chair and sturdy chair.   FUNCTIONAL TESTs:  06/16/2023: pt able to pick up item from floor with CGA and PT instructions for technique.   05/18/2023:  Standing at sink with one hand hovering for safety: looking over shoulders to right & left and reaching forward 7" to tap back wall.  PT cued verbal & tactile on equal weight bearing.    04/15/2023:  patient requires minA for static balance 60 seconds.    GAIT: 06/16/2023: pt amb >100' with axillary crutches with supervision with 4-point pattern with PT cues on sequencing.  4-point pattern facilitates step through pattern which engages prosthesis better and counter trunk rotation for balance.   05/18/2023: Pt amb 100' with axillary crutches with supervision / verbal cues for pelvic orientation and  upright posture.  Prosthetic knee was stable in stance with changes from prosthetist.   Pt neg 12* incline with crutches with supervision with difficulty weight shifting over prosthesis in stance. PT demo option of side stepping on ramp or incline if too steep.  Pt able to side step on 12* incline with supervision and pt reports easier.   Pt neg 6.5" curb with axillary crutches with supervision.    04/15/2023: Gait pattern: step to pattern, decreased step length- Left, decreased stance time- Right, decreased hip/knee flexion- Right, circumduction- Right, Right hip hike, antalgic, trendelenburg, lateral hip instability, trunk flexed, abducted- Right, and poor foot clearance- Right POOR weight shift onto prosthesis in stance,  Distance walked: 100' Assistive device utilized: Crutches and TFA prosthesis Level of assistance: SBA with crutches   03/30/2023: Gait pattern: step to pattern, decreased step length- Left,  decreased stance time- Right, decreased hip/knee flexion- Right, circumduction- Right, Right hip hike, antalgic, trendelenburg, lateral hip instability, trunk flexed, abducted- Right, and poor foot clearance- Right Distance walked: 100' Assistive device utilized: Crutches and TFA prosthesis Level of assistance: SBA with crutches Comments: pt amb 3' with HHA maxA   CURRENT PROSTHETIC WEAR ASSESSMENT: 05/18/2023:  pt verbalizes proper donning & cleaning of prosthesis. Pt reports wear >10 hours without skin issues.    Evaluation on 02/16/2023:  Patient is dependent with: skin check, residual limb care, care of non-amputated limb, prosthetic cleaning, ply sock cleaning, correct ply sock adjustment, proper wear schedule/adjustment, and proper weight-bearing schedule/adjustment Donning prosthesis: SBA Doffing prosthesis: SBA Prosthetic wear tolerance: 2-8 hours, 1x/day5 of 6 days since delivery Prosthetic weight bearing tolerance: 5 minutes with limb pain 7/10 Edema: pitting  edema Residual limb condition: no open areas, normal color, temperature & moisture, scar is mobile with slight invagination. Prosthetic description: silicon liner with velcro landyard, ischial containment socket with flexible inner liner, multi-axial hydraulic knee, dynamic response foot K code/activity level with prosthetic use: Level 3    TODAY'S TREATMENT:                                                                                                                             DATE:  07/08/2023: Prosthetic Training with Transfemoral Prosthesis:  P***   TREATMENT:                                                                                                                             DATE:  07/01/2023: Prosthetic Training with Transfemoral Prosthesis:  PT reviewed need to tighten suspension strap during day.  PT assisted pt with fully tightening his strap.    PT set up appt with Carolyne Fiscal, prosthetist for Tuesday, 07/07/23 at 1:00. Pt to discuss pads in socket at his limb has shrunk, check alignment and set up follow up appointment.   PT reviewed 4-point pattern seated.   Pt needs verbal cues for sequencing.  PT introduced forearm crutches: tactile & verbal cues for sit to/from stand with forearm crutches.  Pt amb 100' with constant cues for 4-point pattern.   Pt neg 12* ramp with forearm crutches with minA & verbal / manual cues for technique / sequence / weight shift.  Pt neg 6.5" curb with forearm crutches with minA & verbal cues on techqnique.  PT assisted pt with pricing forearm  crutches as his insurance has already paid for walking device.       TREATMENT:                                                                                                                             DATE:  06/16/2023: Prosthetic Training with Transfemoral Prosthesis:  PT cued on sit to stand in lobby as he was unstable upon arising.  Pt amb 75' X 3 with 4 point pattern with crutches with PT cues on  sequencing.  Pt amb inside //bars with LUE support 8' X 4 with minA. Progressed to turning 90* then ambulating inside //bars X 6 reps.   Neuromuscular Re-education:  Side stepping in //bars initial rep with BUE support then progressed to LUE only for 2 reps.  Walking backwards in //bars BUE support X 4 reps with cues.  Reaching BUEs to 18" chair seat lightly touching to upright without UE support 10 reps with PT CGA to SBA.  PT demo & verbal cues on picking up items from floor with TFA prosthesis.  Pt able to pick up item without UE support X 3 reps with CGA.     HOME EXERCISE PROGRAM:  ASSESSMENT:  CLINICAL IMPRESSION: ***   PT began working with forearm crutches for prosthetic gait.  He was more comfortable with forearm crutches vs axillary crutches.   Patient would benefit from skilled PT to further improve his mobility.    OBJECTIVE IMPAIRMENTS: Abnormal gait, decreased activity tolerance, decreased balance, decreased endurance, decreased knowledge of condition, decreased knowledge of use of DME, decreased mobility, difficulty walking, decreased ROM, decreased strength, decreased safety awareness, postural dysfunction, prosthetic dependency , and pain.   ACTIVITY LIMITATIONS: carrying, lifting, standing, stairs, transfers, and locomotion level  PARTICIPATION LIMITATIONS: meal prep, cleaning, driving, community activity, occupation, and yard work  PERSONAL FACTORS: Education, Fitness, Past/current experiences, Time since onset of injury/illness/exacerbation, and 3+ comorbidities: see PMH  are also affecting patient's functional outcome.   REHAB POTENTIAL: Good  CLINICAL DECISION MAKING: Evolving/moderate complexity  EVALUATION COMPLEXITY: Moderate   GOALS: Goals reviewed with patient? Yes  SHORT TERM GOALS: Target date: 05/14/2023  Patient donnes prosthesis modified independent & verbalizes proper cleaning. Baseline: SEE OBJECTIVE DATA Goal status: MET 05/18/2023 2.  Patient  tolerates prosthesis >10 hrs total /day without skin issues or limb pain >5/10 after standing. Baseline: SEE OBJECTIVE DATA Goal status: MET 05/18/2023  3.  Patient able to reach 7" and look over both shoulders without UE support with supervision. Baseline: SEE OBJECTIVE DATA Goal status: MET 05/18/2023  4. Patient ambulates 200' with crutches & prosthesis with supervision / verbal cues only. Baseline: SEE OBJECTIVE DATA Goal status: MET 05/18/2023  5. Patient negotiates ramps & curbs with crutches & prosthesis with supervision. Baseline: SEE OBJECTIVE DATA Goal status: MET 05/18/2023  LONG TERM GOALS: Target date: 07/09/2023  Patient demonstrates & verbalized understanding of prosthetic care to enable safe utilization of prosthesis. Baseline: SEE OBJECTIVE  DATA Goal status: Ongoing 07/08/2023  Patient tolerates prosthesis wear >90% of awake hours without skin or limb pain issues. Baseline: SEE OBJECTIVE DATA Goal status: Ongoing   07/08/2023  Berg Balance >36/56 to indicate lower fall risk Baseline: SEE OBJECTIVE DATA Goal status: Ongoing   07/08/2023  Patient ambulates >300' with prosthesis and cane or less independently Baseline: SEE OBJECTIVE DATA Goal status: Ongoing   07/08/2023  Patient negotiates ramps, curbs & stairs with single rail with prosthesis and cane independently. Baseline: SEE OBJECTIVE DATA Goal status: Ongoing   07/08/2023    PLAN:  PT FREQUENCY: 2x/week  PT DURATION: 13 weeks / 90 days  PLANNED INTERVENTIONS: 97164- PT Re-evaluation, 97110-Therapeutic exercises, 97530- Therapeutic activity, 97112- Neuromuscular re-education, 97535- Self Care, 62952- Manual therapy, [phone]- Gait training, [phone]- Prosthetic training, Patient/Family education, Balance training, Stair training, Vestibular training, DME instructions, Therapeutic exercises, Therapeutic activity, Neuromuscular re-education, Gait training, and Self Care  PLAN FOR NEXT SESSION:   ***  check LTGs next  week  Vladimir Faster, PT, DPT 07/08/2023, 7:35 AM

## 2023-07-08 NOTE — Telephone Encounter (Signed)
 Patient no-showed PT appointment.  He reports that he did not come because his appointment with prosthetist had to be rescheduled to 07/09/23 at 1:00.  He thought he needed to see prosthetist prior to coming back to PT.  PT explained that we could still work even though he has not seen prosthetist.  He needs to call to schedule more PT appts as today was last one on book.

## 2023-07-29 ENCOUNTER — Telehealth: Payer: Self-pay | Admitting: Physical Therapy

## 2023-07-29 ENCOUNTER — Encounter: Admitting: Physical Therapy

## 2023-07-29 NOTE — Telephone Encounter (Signed)
 PT called patient about no-show.  He reports fell last night and is too sore.   Prosthetist is making new prosthesis.   PT reviewed attendance policy with patient.

## 2023-08-20 ENCOUNTER — Ambulatory Visit: Admitting: Orthopedic Surgery

## 2023-08-20 DIAGNOSIS — Z89611 Acquired absence of right leg above knee: Secondary | ICD-10-CM | POA: Diagnosis not present

## 2023-09-09 ENCOUNTER — Encounter: Payer: Self-pay | Admitting: Orthopedic Surgery
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
## 2023-10-22 DIAGNOSIS — Z89611 Acquired absence of right leg above knee: Secondary | ICD-10-CM | POA: Diagnosis not present

## 2023-11-23 ENCOUNTER — Other Ambulatory Visit: Payer: Self-pay | Admitting: Family Medicine

## 2023-11-23 NOTE — Telephone Encounter (Signed)
 Copied from CRM [id number]. Topic: Clinical - Medication Refill >> Nov 23, 2023  3:54 PM Henretta I wrote: Medication: furosemide  (LASIX ) 20 MG tablet  Has the patient contacted their pharmacy? Yes (Agent: If no, request that the patient contact the pharmacy for the refill. If patient does not wish to contact the pharmacy document the reason why and proceed with request.) (Agent: If yes, when and what did the pharmacy advise?)  This is the patient's preferred pharmacy:  [facility] [address] [place] - [address] [place] [address] Phone: [phone] Fax: [phone]  Is this the correct pharmacy for this prescription? Yes If no, delete pharmacy and type the correct one.   Has the prescription been filled recently? No  Is the patient out of the medication? Yes  Has the patient been seen for an appointment in the last year OR does the patient have an upcoming appointment? Yes  Can we respond through MyChart? No  Agent: Please be advised that Rx refills may take up to 3 business days. We ask that you follow-up with your pharmacy.

## 2023-11-24 ENCOUNTER — Telehealth: Payer: Self-pay

## 2023-11-24 NOTE — Telephone Encounter (Signed)
 Patient has an appt sch for 8/7 with Dr. Mercer

## 2023-11-24 NOTE — Telephone Encounter (Signed)
 Copied from CRM [id number]. Topic: Clinical - Medication Question >> Nov 23, 2023  4:12 PM Henretta I wrote: Reason for CRM: Patients wife wanted to know if patient can be prescribed Doxycycline  again as he was prescribed this before by Dr.Campbell to take for life to prevent infections but Dr. Elaine has now retired.

## 2023-11-30 MED ORDER — FUROSEMIDE 20 MG PO TABS: 20.0000 mg | ORAL_TABLET | Freq: Two times a day (BID) | ORAL | 1 refills | Status: AC

## 2023-12-03 ENCOUNTER — Ambulatory Visit: Admitting: Family Medicine

## 2023-12-03 ENCOUNTER — Encounter: Payer: Self-pay | Admitting: Family Medicine

## 2023-12-03 VITALS — BP 138/70 | HR 78 | Temp 96.7°F | Ht 68.0 in | Wt 128.2 lb

## 2023-12-03 DIAGNOSIS — I10 Essential (primary) hypertension: Secondary | ICD-10-CM | POA: Diagnosis not present

## 2023-12-03 DIAGNOSIS — Z125 Encounter for screening for malignant neoplasm of prostate: Secondary | ICD-10-CM

## 2023-12-03 DIAGNOSIS — Z89611 Acquired absence of right leg above knee: Secondary | ICD-10-CM | POA: Diagnosis not present

## 2023-12-03 DIAGNOSIS — H269 Unspecified cataract: Secondary | ICD-10-CM

## 2023-12-03 DIAGNOSIS — Z0000 Encounter for general adult medical examination without abnormal findings: Secondary | ICD-10-CM

## 2023-12-03 DIAGNOSIS — Z13220 Encounter for screening for lipoid disorders: Secondary | ICD-10-CM

## 2023-12-03 MED ORDER — DOXYCYCLINE HYCLATE 100 MG PO TABS: 100.0000 mg | ORAL_TABLET | Freq: Two times a day (BID) | ORAL | 1 refills | Status: AC

## 2023-12-03 NOTE — Progress Notes (Signed)
 Established Patient Office Visit   Subjective  Patient ID: Mark [name]., male    DOB: 02-Nov-1955  Age: 68 y.o. MRN: [id number]  Chief Complaint  Patient presents with   Annual Exam  Patient accompanied by his granddaughter, wife, and great-grand daughter.  Patient is a 68 year old male seen for annual exam.  Patient states he is doing okay.  Seen by [facility] for new prosthetic status post right AKA.  Feels like current prosthetic was good but now bending easily at the knee making it difficult to ambulate.  Patient and his wife inquire about refill on doxycycline .  Previously written by Dr. Elaine, MD of [facility] who retired.  Pt was on medication chronically due to history of MRSA infection.  Out x 2 months or more.  Patient endorses decreased vision.  Requesting new referral to ophthalmologist for cataracts due to a breakdown in communication at previous provider.    Patient Active Problem List   Diagnosis Date Noted   Acute blood loss anemia 03/02/2023   Choledocholithiasis 02/28/2023   HTN (hypertension) 02/28/2023   Hypokalemia 02/28/2023   Cholelithiasis with biliary obstruction 02/27/2023   Infection of total knee replacement ([name]) 11/07/2022   Hx of AKA (above knee amputation), right ([name]) 11/07/2022   Cerebrovascular disease 07/08/2022   Renal cyst 07/08/2022   Protein-calorie malnutrition, severe 07/08/2022   Toxic metabolic encephalopathy 02/26/2022   Septic joint of right knee joint ([name]) 11/08/2020   S/P right TKA reimplantation 08/02/2020   Infection of total left knee replacement ([name]) 03/20/2020   Cirrhosis of liver without ascites ([name])    AKI (acute kidney injury) ([name])    Bacteremia    Spinal stenosis of lumbar region without neurogenic claudication    Aspiration pneumonia ([name]) 09/24/2018   Abscess in epidural space of lumbar spine    MRSA bacteremia 09/21/2018   Infection of prosthetic right knee joint ([name]) 09/20/2018   Anemia of chronic disease  09/20/2018   Hypoalbuminemia 09/20/2018   Hypoglycemia without diagnosis of diabetes mellitus 09/20/2018   Epidural abscess 09/20/2018   Hyperkalemia 05/18/2018   Edentulous 05/11/2018   Pancytopenia ([name]) 05/10/2018   CAD (coronary artery disease) 05/10/2018   Hepatic encephalopathy ([name]) 05/10/2018   Alcohol  abuse 05/10/2018   GERD (gastroesophageal reflux disease) 05/10/2018   Duodenal ulcer    Renal failure    Acute on chronic anemia    Hypotension 06/17/2017   Hyponatremia 06/17/2017   Leg edema, right 06/17/2017   Acute metabolic encephalopathy 06/17/2017   Anemia, iron  deficiency    Benign neoplasm of ascending colon    Hemorrhoids    Portal hypertensive gastropathy ([name])    Gastritis and gastroduodenitis    Esophageal varices without bleeding ([name])    H/O: CVA (cerebrovascular accident) 12/01/2013   ACS (acute coronary syndrome) ([name]) 12/01/2013   Anasarca 12/01/2013   Polysubstance abuse ([name]) 12/01/2013   CKD (chronic kidney disease) stage 3, GFR 30-59 ml/min ([name]) 12/01/2013   S/P right TKA 05/02/2013   Murmur 04/14/2013   Right inguinal hernia 12/26/2010   Cirrhosis, alcoholic ([name]) 12/20/2010   Past Medical History:  Diagnosis Date   Alcohol  abuse    Anemia    Arthritis    Ascites    Cirrhosis ([name])    Coffee ground emesis    Dehydration 06/17/2017   Febrile illness    GERD (gastroesophageal reflux disease)    Heart murmur    History of blood transfusion    Hyperlipidemia    Hypertension  Leg swelling    Myocardial infarction [facility]) 2012   Preop cardiovascular exam 04/14/2013   Sepsis ([name]) 06/17/2017   Septic shock ([name]) 06/18/2017   SIRS (systemic inflammatory response syndrome) ([name]) 07/11/2017   Stroke ([name]) 10/2009   TIA   Thrombocytopenia ([name])    Past Surgical History:  Procedure Laterality Date   AMPUTATION Right 11/07/2022   Procedure: RIGHT ABOVE KNEE AMPUTATION;  Surgeon: Harden Jerona GAILS, MD;  Location[facility] OR;  Service: Orthopedics;   Laterality: Right;   COLONOSCOPY WITH PROPOFOL  N/A 02/07/2016   Procedure: COLONOSCOPY WITH PROPOFOL ;  Surgeon: Toribio SHAUNNA Cedar, MD;  [facility];  Service: Endoscopy;  Laterality: N/A;   ERCP N/A 02/28/2023   Procedure: ENDOSCOPIC RETROGRADE CHOLANGIOPANCREATOGRAPHY (ERCP);  Surgeon: Rollin Dover, MD;  Location: [facility];  Service: Gastroenterology;  Laterality: N/A;   ESOPHAGOGASTRODUODENOSCOPY (EGD) WITH PROPOFOL  N/A 02/07/2016   Procedure: ESOPHAGOGASTRODUODENOSCOPY (EGD) WITH PROPOFOL ;  Surgeon: Toribio SHAUNNA Cedar, MD;  [facility];  Service: Endoscopy;  Laterality: N/A;   ESOPHAGOGASTRODUODENOSCOPY (EGD) WITH PROPOFOL  N/A 06/22/2017   Procedure: ESOPHAGOGASTRODUODENOSCOPY (EGD) WITH PROPOFOL ;  Surgeon: Albertus Gordy HERO, MD;  Location: [facility] ENDOSCOPY;  Service: Gastroenterology;  Laterality: N/A;   EXCISIONAL TOTAL KNEE ARTHROPLASTY WITH ANTIBIOTIC SPACERS Right 03/20/2020   Procedure: Resection right total knee arthroplasty and placement of antibiotic spacer;  Surgeon: Ernie Cough, MD;  Location: [facility];  Service: Orthopedics;  Laterality: Right;  90 mins   HERNIA REPAIR Right    inguinal   INGUINAL HERNIA REPAIR Left 02/08/2021   Procedure: OPEN LEFT INGUINAL HERNIA REPAIR WITH MESH;  Surgeon: Vernetta Berg, MD;  Location: [facility] OR;  Service: General;  Laterality: Left;   IRRIGATION AND DEBRIDEMENT KNEE Right 11/13/2020   Procedure: IRRIGATION AND DEBRIDEMENT KNEE;  Surgeon: Ernie Cough, MD;  Location: [facility];  Service: Orthopedics;  Laterality: Right;   JOINT REPLACEMENT     KNEE ARTHROSCOPY     bilateral/  12/14   REIMPLANTATION OF TOTAL KNEE Right 08/02/2020   Procedure: REIMPLANTATION/REVISION OF TOTAL KNEE WITH REMOVAL OF ANTIBIOTIC SPACER;  Surgeon: Ernie Cough, MD;  Location: [facility];  Service: Orthopedics;  Laterality: Right;    REMOVAL OF STONES  02/28/2023   Procedure: REMOVAL OF DEBRIS;  Surgeon: Rollin Dover, MD;  Location: [facility];   Service: Gastroenterology;;   ANNETT  02/28/2023   Procedure: ANNETT;  Surgeon: Rollin Dover, MD;  Location: [facility];  Service: Gastroenterology;;   TEE WITHOUT CARDIOVERSION N/A 05/13/2018   Procedure: TRANSESOPHAGEAL ECHOCARDIOGRAM (TEE);  Surgeon: Loni Soyla LABOR, MD;  Location: [facility] ENDOSCOPY;  Service: Cardiovascular;  Laterality: N/A;   TOTAL KNEE ARTHROPLASTY Right 05/02/2013   Procedure: RIGHT TOTAL KNEE ARTHROPLASTY;  Surgeon: Cough JONETTA Ernie, MD;  Location: [facility];  Service: Orthopedics;  Laterality: Right;   Social History   Tobacco Use   Smoking status: Former    Current packs/day: 0.00    Average packs/day: 0.5 packs/day for 10.0 years (5.0 ttl pk-yrs)    Types: Cigarettes    Start date: 01/04/2006    Quit date: 01/05/2016    Years since quitting: 7.9   Smokeless tobacco: Never   Tobacco comments:    Pt reports he quit smoke 3 months ago. 07/11/22-km,cma  Vaping Use   Vaping status: Never Used  Substance Use Topics   Alcohol  use: Yes    Comment: 40 ounces a day   Drug use: Yes    Types: Marijuana    Comment: occasionally   Family History  Problem Relation Age of Onset   Hypertension Father    Diabetes Father    Dementia Mother    Lupus Sister    No Known Allergies  ROS Negative unless stated above    Objective:     BP 138/70 (BP Location: Left Arm, Patient Position: Sitting, Cuff Size: Normal)   Pulse 78   Temp (!) 96.7 F (35.9 C) (Oral)   Ht 5' 8 (1.727 m)   Wt 128 lb 3.2 oz (58.2 kg)   SpO2 100%   BMI 19.49 kg/m  BP Readings from Last 3 Encounters:  12/03/23 138/70  03/30/23 132/70  03/03/23 (!) 153/61   Wt Readings from Last 3 Encounters:  12/03/23 128 lb 3.2 oz (58.2 kg)  02/28/23 119 lb 15.6 oz (54.4 kg)  02/18/23 120 lb (54.4 kg)      Physical Exam Constitutional:      Appearance: Normal appearance.  HENT:     Head: Normocephalic and atraumatic.     Right Ear: Tympanic membrane, ear canal and external ear  normal.     Left Ear: Tympanic membrane, ear canal and external ear normal.     Nose: Nose normal.     Mouth/Throat:     Mouth: Mucous membranes are moist.     Pharynx: No oropharyngeal exudate or posterior oropharyngeal erythema.  Eyes:     General: No scleral icterus.    Extraocular Movements: Extraocular movements intact.     Conjunctiva/sclera: Conjunctivae normal.     Pupils: Pupils are equal, round, and reactive to light.  Neck:     Thyroid : No thyromegaly.  Cardiovascular:     Rate and Rhythm: Normal rate and regular rhythm.     Pulses: Normal pulses.     Heart sounds: Murmur heard.     Systolic murmur is present with a grade of 2/6.     No friction rub.  Pulmonary:     Effort: Pulmonary effort is normal.     Breath sounds: Normal breath sounds. No wheezing, rhonchi or rales.  Abdominal:     General: Bowel sounds are normal.     Palpations: Abdomen is soft.     Tenderness: There is no abdominal tenderness.  Musculoskeletal:        General: No deformity. Normal range of motion.     Comments: Right AKA with prosthesis in place.  Lymphadenopathy:     Cervical: No cervical adenopathy.  Skin:    General: Skin is warm and dry.     Findings: No lesion.  Neurological:     General: No focal deficit present.     Mental Status: He is alert and oriented to person, place, and time.     Comments: Gait not assessed this patient sitting in transport wheelchair.  Psychiatric:        Mood and Affect: Mood normal.        Thought Content: Thought content normal.        12/03/2023    3:33 PM 08/21/2022    9:27 AM 07/31/2022    9:55 AM  Depression screen PHQ 2/9  Decreased Interest 0 0 0  Down, Depressed, Hopeless 0 1 0  PHQ - 2 Score 0 1 0  Altered sleeping 0 0   Tired, decreased energy 0 0   Change in appetite 0 0   Feeling bad or failure about yourself  0 0   Trouble concentrating 0 0   Moving slowly or fidgety/restless 0 0   Suicidal thoughts  0 0   PHQ-9 Score 0 1    Difficult doing work/chores  Not difficult at all       12/03/2023    3:33 PM  GAD 7 : Generalized Anxiety Score  Nervous, Anxious, on Edge 0  Control/stop worrying 0  Worry too much - different things 0  Trouble relaxing 0  Restless 0  Easily annoyed or irritable 0  Afraid - awful might happen 0  Total GAD 7 Score 0     No results found for any visits on 12/03/23.    Assessment & Plan:   Well adult exam -     CBC with Differential/Platelet; Future -     Comprehensive metabolic panel with GFR; Future -     Lipid panel; Future -     TSH; Future -     T4, free; Future  Essential hypertension -     CBC with Differential/Platelet; Future -     Comprehensive metabolic panel with GFR; Future -     TSH; Future -     T4, free; Future  Screening for prostate cancer -     PSA, Medicare  S/P AKA (above knee amputation), right ([name]) -     CBC with Differential/Platelet; Future  Cataract of both eyes, unspecified cataract type -     Ambulatory referral to Ophthalmology  Other orders -     Doxycycline  Hyclate; Take 1 tablet (100 mg total) by mouth 2 (two) times daily.  Dispense: 90 tablet; Refill: 1  Age-appropriate health screenings discussed.  Obtain labs.  Immunizations reviewed.  Consider pneumonia vaccine at local pharmacy.  BP controlled.  Continue current medication.  Consider Medicare AWV.  Patient to contact [facility] clinic regarding prosthesis adjustment.  Return if symptoms worsen or fail to improve.   Clotilda JONELLE Single, MD

## 2023-12-04 LAB — CBC WITH DIFFERENTIAL/PLATELET
Basophils Absolute: 0 K/uL (ref 0.0–0.1)
Basophils Relative: 1.3 % (ref 0.0–3.0)
Eosinophils Absolute: 0.2 K/uL (ref 0.0–0.7)
Eosinophils Relative: 6.4 % — ABNORMAL HIGH (ref 0.0–5.0)
HCT: 32.2 % — ABNORMAL LOW (ref 39.0–52.0)
Hemoglobin: 10.9 g/dL — ABNORMAL LOW (ref 13.0–17.0)
Lymphocytes Relative: 32.6 % (ref 12.0–46.0)
Lymphs Abs: 1.1 K/uL (ref 0.7–4.0)
MCHC: 33.9 g/dL (ref 30.0–36.0)
MCV: 99.8 fl (ref 78.0–100.0)
Monocytes Absolute: 0.3 K/uL (ref 0.1–1.0)
Monocytes Relative: 9.9 % (ref 3.0–12.0)
Neutro Abs: 1.6 K/uL (ref 1.4–7.7)
Neutrophils Relative %: 49.8 % (ref 43.0–77.0)
Platelets: 135 K/uL — ABNORMAL LOW (ref 150.0–400.0)
RBC: 3.22 Mil/uL — ABNORMAL LOW (ref 4.22–5.81)
RDW: 14.4 % (ref 11.5–15.5)
WBC: 3.3 K/uL — ABNORMAL LOW (ref 4.0–10.5)

## 2023-12-04 LAB — LIPID PANEL
Cholesterol: 129 mg/dL (ref 0–200)
HDL: 55.9 mg/dL (ref >39.00)
LDL Cholesterol: 66 mg/dL (ref 0–99)
NonHDL: 73.33
Total CHOL/HDL Ratio: 2
Triglycerides: 38 mg/dL (ref 0.0–149.0)
VLDL: 7.6 mg/dL (ref 0.0–40.0)

## 2023-12-04 LAB — COMPREHENSIVE METABOLIC PANEL WITH GFR
ALT: 12 U/L (ref 0–53)
AST: 29 U/L (ref 0–37)
Albumin: 3.8 g/dL (ref 3.5–5.2)
Alkaline Phosphatase: 94 U/L (ref 39–117)
BUN: 14 mg/dL (ref 6–23)
CO2: 18 meq/L — ABNORMAL LOW (ref 19–32)
Calcium: 8.9 mg/dL (ref 8.4–10.5)
Chloride: 114 meq/L — ABNORMAL HIGH (ref 96–112)
Creatinine, Ser: 1.12 mg/dL (ref 0.40–1.50)
GFR: 66.67 mL/min (ref >60.00)
Glucose, Bld: 79 mg/dL (ref 70–99)
Potassium: 4.6 meq/L (ref 3.5–5.1)
Sodium: 143 meq/L (ref 135–145)
Total Bilirubin: 0.7 mg/dL (ref 0.2–1.2)
Total Protein: 7 g/dL (ref 6.0–8.3)

## 2023-12-04 LAB — PSA, MEDICARE: PSA: 0.83 ng/mL (ref 0.10–4.00)

## 2023-12-04 LAB — T4, FREE: Free T4: 0.89 ng/dL (ref 0.60–1.60)

## 2023-12-04 LAB — TSH: TSH: 0.61 u[IU]/mL (ref 0.35–5.50)

## 2023-12-16 ENCOUNTER — Ambulatory Visit: Payer: Self-pay | Admitting: Family Medicine

## 2023-12-17 ENCOUNTER — Telehealth: Payer: Self-pay

## 2023-12-17 NOTE — Telephone Encounter (Signed)
 Copied from CRM [id number]. Topic: General - Other >> Dec 17, 2023  2:20 PM Berneda FALCON wrote: Reason for CRM: Patient returning phone call to [facility] for his lab results. Called the CAL to see if she was available but was unable to reach anyone at the practice. Please return patient's phone call at   [phone]

## 2023-12-18 NOTE — Telephone Encounter (Signed)
 See lab results note.

## 2024-02-08 ENCOUNTER — Other Ambulatory Visit: Payer: Self-pay

## 2024-02-08 ENCOUNTER — Telehealth: Payer: Self-pay | Admitting: Orthopedic Surgery

## 2024-02-08 DIAGNOSIS — Z89611 Acquired absence of right leg above knee: Secondary | ICD-10-CM

## 2024-02-08 NOTE — Telephone Encounter (Signed)
 Pt called wanting to schedule PT Pt call back number is [phone]

## 2024-02-08 NOTE — Telephone Encounter (Signed)
 Order in chart right AKA prosthetic gait training.

## 2024-02-29 ENCOUNTER — Ambulatory Visit (INDEPENDENT_AMBULATORY_CARE_PROVIDER_SITE_OTHER): Admitting: Physical Therapy

## 2024-02-29 ENCOUNTER — Encounter: Payer: Self-pay | Admitting: Physical Therapy

## 2024-02-29 DIAGNOSIS — R2681 Unsteadiness on feet: Secondary | ICD-10-CM

## 2024-02-29 DIAGNOSIS — M6281 Muscle weakness (generalized): Secondary | ICD-10-CM | POA: Diagnosis not present

## 2024-02-29 DIAGNOSIS — R296 Repeated falls: Secondary | ICD-10-CM | POA: Diagnosis not present

## 2024-02-29 DIAGNOSIS — R2689 Other abnormalities of gait and mobility: Secondary | ICD-10-CM

## 2024-02-29 DIAGNOSIS — M25651 Stiffness of right hip, not elsewhere classified: Secondary | ICD-10-CM

## 2024-02-29 NOTE — Therapy (Addendum)
 OUTPATIENT PHYSICAL THERAPY PROSTHETIC EVALUATION   Patient Name: Mark [name]. MRN: [id number] DOB:[birth year], 68 y.o., male Today's Date: 02/29/2024  END OF SESSION:  PT End of Session - 02/29/24 1637     Visit Number 1    Number of Visits 25    Date for Recertification  05/26/24    Authorization Type [employer] state Health and [facility] Medicare    Progress Note Due on Visit 10    PT Start Time 1300    PT Stop Time 1345    PT Time Calculation (min) 45 min    Activity Tolerance Patient tolerated treatment well    Behavior During Therapy WFL for tasks assessed/performed          Past Medical History:  Diagnosis Date   Alcohol  abuse    Anemia    Arthritis    Ascites    Cirrhosis ([name])    Coffee ground emesis    Dehydration 06/17/2017   Febrile illness    GERD (gastroesophageal reflux disease)    Heart murmur    History of blood transfusion    Hyperlipidemia    Hypertension    Leg swelling    Myocardial infarction ([name]) 2012   Preop cardiovascular exam 04/14/2013   Sepsis ([name]) 06/17/2017   Septic shock ([name]) 06/18/2017   SIRS (systemic inflammatory response syndrome) ([name]) 07/11/2017   Stroke ([name]) 10/2009   TIA   Thrombocytopenia    Past Surgical History:  Procedure Laterality Date   AMPUTATION Right 11/07/2022   Procedure: RIGHT ABOVE KNEE AMPUTATION;  Surgeon: Harden Jerona GAILS, MD;  Location: [facility] OR;  Service: Orthopedics;  Laterality: Right;   COLONOSCOPY WITH PROPOFOL  N/A 02/07/2016   Procedure: COLONOSCOPY WITH PROPOFOL ;  Surgeon: Toribio SHAUNNA Cedar, MD;  Location: [facility];  Service: Endoscopy;  Laterality: N/A;   ERCP N/A 02/28/2023   Procedure: ENDOSCOPIC RETROGRADE CHOLANGIOPANCREATOGRAPHY (ERCP);  Surgeon: Rollin Dover, MD;  Location: [facility];  Service: Gastroenterology;  Laterality: N/A;   ESOPHAGOGASTRODUODENOSCOPY (EGD) WITH PROPOFOL  N/A 02/07/2016   Procedure: ESOPHAGOGASTRODUODENOSCOPY (EGD) WITH PROPOFOL ;  Surgeon: Toribio SHAUNNA Cedar, MD;   Location: [facility];  Service: Endoscopy;  Laterality: N/A;   ESOPHAGOGASTRODUODENOSCOPY (EGD) WITH PROPOFOL  N/A 06/22/2017   Procedure: ESOPHAGOGASTRODUODENOSCOPY (EGD) WITH PROPOFOL ;  Surgeon: Albertus Gordy HERO, MD;  Location: [facility] ENDOSCOPY;  Service: Gastroenterology;  Laterality: N/A;   EXCISIONAL TOTAL KNEE ARTHROPLASTY WITH ANTIBIOTIC SPACERS Right 03/20/2020   Procedure: Resection right total knee arthroplasty and placement of antibiotic spacer;  Surgeon: Ernie Cough, MD;  Location: [facility] ORS;  Service: Orthopedics;  Laterality: Right;  90 mins   HERNIA REPAIR Right    inguinal   INGUINAL HERNIA REPAIR Left 02/08/2021   Procedure: OPEN LEFT INGUINAL HERNIA REPAIR WITH MESH;  Surgeon: Vernetta Berg, MD;  Location: [facility] OR;  Service: General;  Laterality: Left;   IRRIGATION AND DEBRIDEMENT KNEE Right 11/13/2020   Procedure: IRRIGATION AND DEBRIDEMENT KNEE;  Surgeon: Ernie Cough, MD;  Location: [facility];  Service: Orthopedics;  Laterality: Right;   JOINT REPLACEMENT     KNEE ARTHROSCOPY     bilateral/  12/14   REIMPLANTATION OF TOTAL KNEE Right 08/02/2020   Procedure: REIMPLANTATION/REVISION OF TOTAL KNEE WITH REMOVAL OF ANTIBIOTIC SPACER;  Surgeon: Ernie Cough, MD;  Location: [facility];  Service: Orthopedics;  Laterality: Right;    REMOVAL OF STONES  02/28/2023   Procedure: REMOVAL OF DEBRIS;  Surgeon: Rollin Dover, MD;  Location: [facility];  Service: Gastroenterology;;   ANNETT  02/28/2023  Procedure: SPHINCTEROTOMY;  Surgeon: Rollin Dover, MD;  Location: [facility];  Service: Gastroenterology;;   TEE WITHOUT CARDIOVERSION N/A 05/13/2018   Procedure: TRANSESOPHAGEAL ECHOCARDIOGRAM (TEE);  Surgeon: Loni Soyla LABOR, MD;  Location: [facility] ENDOSCOPY;  Service: Cardiovascular;  Laterality: N/A;   TOTAL KNEE ARTHROPLASTY Right 05/02/2013   Procedure: RIGHT TOTAL KNEE ARTHROPLASTY;  Surgeon: Donnice JONETTA Car, MD;  Location: [facility];  Service: Orthopedics;  Laterality: Right;    Patient Active Problem List   Diagnosis Date Noted   Acute blood loss anemia 03/02/2023   Choledocholithiasis 02/28/2023   HTN (hypertension) 02/28/2023   Hypokalemia 02/28/2023   Cholelithiasis with biliary obstruction 02/27/2023   Infection of total knee replacement 11/07/2022   Hx of AKA (above knee amputation), right ([name]) 11/07/2022   Cerebrovascular disease 07/08/2022   Renal cyst 07/08/2022   Protein-calorie malnutrition, severe 07/08/2022   Toxic metabolic encephalopathy 02/26/2022   Septic joint of right knee joint ([name]) 11/08/2020   S/P right TKA reimplantation 08/02/2020   Infection of total left knee replacement 03/20/2020   Cirrhosis of liver without ascites ([name])    AKI (acute kidney injury)    Bacteremia    Spinal stenosis of lumbar region without neurogenic claudication    Aspiration pneumonia ([name]) 09/24/2018   Abscess in epidural space of lumbar spine    MRSA bacteremia 09/21/2018   Infection of prosthetic right knee joint 09/20/2018   Anemia of chronic disease 09/20/2018   Hypoalbuminemia 09/20/2018   Hypoglycemia without diagnosis of diabetes mellitus 09/20/2018   Epidural abscess 09/20/2018   Hyperkalemia 05/18/2018   Edentulous 05/11/2018   Pancytopenia ([name]) 05/10/2018   CAD (coronary artery disease) 05/10/2018   Hepatic encephalopathy ([name]) 05/10/2018   Alcohol  abuse 05/10/2018   GERD (gastroesophageal reflux disease) 05/10/2018   Duodenal ulcer    Renal failure    Acute on chronic anemia    Hypotension 06/17/2017   Hyponatremia 06/17/2017   Leg edema, right 06/17/2017   Acute metabolic encephalopathy 06/17/2017   Anemia, iron  deficiency    Benign neoplasm of ascending colon    Hemorrhoids    Portal hypertensive gastropathy ([name])    Gastritis and gastroduodenitis    Esophageal varices without bleeding ([name])    H/O: CVA (cerebrovascular accident) 12/01/2013   ACS (acute coronary syndrome) ([name]) 12/01/2013   Anasarca 12/01/2013    Polysubstance abuse ([name]) 12/01/2013   CKD (chronic kidney disease) stage 3, GFR 30-59 ml/min ([name]) 12/01/2013   S/P right TKA 05/02/2013   Murmur 04/14/2013   Right inguinal hernia 12/26/2010   Cirrhosis, alcoholic ([name]) 12/20/2010    PCP: Mercer Clotilda SAUNDERS, MD  REFERRING PROVIDER: Harden Jerona GAILS, MD  ONSET DATE: 02/08/2024 MD referral to PT  REFERRING DIAG: Z89.611 (ICD-10-CM) - Hx of AKA (above knee amputation), right   THERAPY DIAG:  Other abnormalities of gait and mobility  Repeated falls  Unsteadiness on feet  Muscle weakness (generalized)  Stiffness of right hip, not elsewhere classified  Rationale for Evaluation and Treatment: Rehabilitation  SUBJECTIVE:   SUBJECTIVE STATEMENT: Patient underwent a right Transfemoral Amputation on 11/07/2022 due to infected TKA. Initial right TKA 2015 with antibiotic spacers 2021 & 2022 with I&D. He got prosthesis on 02/10/2023. He had got new socket in April 2025.  The prosthesis slides off some times.  He is wearing prosthesis daily most of awake hours except if he is staying home.     PERTINENT HISTORY: right TFA 11/07/22, inguinal hernia repair BLEs, ETOH abuse, cirrhosis, arthritis, GERD, HTN, HLD, heart  murmur, MI, TIA 2011, systemic inflammatory response syndrome   PAIN:  NPRS scale: up to 7/10 Pain location: right residual limb Pain description: soreness Aggravating factors: unknown Relieving factors: it goes away on its own  PRECAUTIONS: Fall  WEIGHT BEARING RESTRICTIONS: No  FALLS:  Has patient fallen in last 6 months? Yes. Number of falls >4 x/month. He denies injuries.  If prosthesis twist and catches prosthesis going up steps.   LIVING ENVIRONMENT: Lives with: lives with their spouse and dog 60#  Lives in: House Home Access: Stairs to enter Home layout: One level Stairs: Yes: External: 3 steps to porch without rails and threshold into house Has following equipment at home: Single point cane, Environmental Consultant - 2  wheeled, Chief Operating Officer, Wheelchair (manual), and Tour manager  OCCUPATION:  retired does odd jobs lobbyist, estate manager/land agent, cut wood   PLOF: Independent  PATIENT GOALS:   He wants to walk   Next MD visit:  OBJECTIVE:   PATIENT SURVEYS:  Patient-Specific Activity Scoring Scheme  0 represents "unable to perform." 10 represents "able to perform at prior level. 0 1 2 3 4 5 6 7 8 9  10 (Date and Score)  Activity Eval     1.   Standing ADLs   8    2.  Walking with prosthesis  4    3.  Repair cars, yard work, chop wood 6   4.    5.    Score 6    Total score = sum of the activity scores/number of activities Minimum detectable change (90%CI) for average score = 2 points Minimum detectable change (90%CI) for single activity score = 3 points  COGNITION: Overall cognitive status: WFL    SENSATION: WFL  MUSCLE LENGTH: Thomas test: Right -25 deg;  POSTURE:  rounded shoulders, forward head, flexed trunk , and weight shift left  LOWER EXTREMITY ROM:   ROM Left eval  Hip flexion   Hip extension -12*  Hip abduction   Hip adduction   Hip internal rotation   Hip external rotation   Knee flexion   Knee extension   Ankle dorsiflexion   Ankle plantarflexion   Ankle inversion   Ankle eversion    (Blank rows = not tested)  LOWER EXTREMITY MMT:  MMT Left eval  Hip flexion   Hip extension 4/5  Hip abduction 4/5  Hip adduction   Hip internal rotation   Hip external rotation   Knee flexion   Knee extension   Ankle dorsiflexion   Ankle plantarflexion   Ankle inversion   Ankle eversion    (Blank rows = not tested)  TRANSFERS: Sit to stand: SBA from 18 chair with armrest uses back of legs against chair to stabilize and requires external support of crutches to position lower extremities for stabilization. Stand to sit: SBA requires use of armrest to control descent to 18 chair  FUNCTIONAL TESTs:  Evaluation on 02/29/2024: Berg Balance 20/56  [facility] PT  Assessment - 02/29/24 1300       Standardized Balance Assessment   Standardized Balance Assessment Berg Balance Test      Berg Balance Test   Sit to Stand Needs minimal aid to stand or to stabilize    Standing Unsupported Able to stand 2 minutes with supervision    Sitting with Back Unsupported but Feet Supported on Floor or Stool Able to sit safely and securely 2 minutes    Stand to Sit Controls descent by using hands    Transfers Able to  transfer safely, definite need of hands    Standing Unsupported with Eyes Closed Unable to keep eyes closed 3 seconds but stays steady    Standing Unsupported with Feet Together Needs help to attain position but able to stand for 30 seconds with feet together    From Standing, Reach Forward with Outstretched Arm Reaches forward but needs supervision    From Standing Position, Pick up Object from Floor Unable to pick up and needs supervision    From Standing Position, Turn to Look Behind Over each Shoulder Needs supervision when turning    Turn 360 Degrees Needs assistance while turning    Standing Unsupported, Alternately Place Feet on Step/Stool Needs assistance to keep from falling or unable to try    Standing Unsupported, One Foot in [employer] balance while stepping or standing    Standing on One Leg Tries to lift leg/unable to hold 3 seconds but remains standing independently    Total Score 20    Berg comment: BERG  < 36 high risk for falls (close to 100%) 46-51 moderate (>50%)   37-45 significant (>80%) 52-55 lower (> 25%)         GAIT: Gait pattern: step to pattern, decreased step length- Left, decreased stance time- Right, decreased hip/knee flexion- Right, circumduction- Right, Right hip hike, antalgic, trunk flexed, and abducted- Right patient is fearful to unlock his prosthesis for swing phase. Pistoning noted with prosthesis due to suspension issue. Distance walked: 120' Assistive device utilized: Crutches and transfemoral  prosthesis Level of assistance: SBA only when using crutches.  He would require greater assistance if he was using a cane or no device.  Gait velocity: 1.37 ft/sec Comments: Excessive weightbearing on axillary crutches  CURRENT PROSTHETIC WEAR ASSESSMENT: Evaluation on 02/29/2024: Patient is independent with: skin check Patient is dependent with: prosthetic cleaning, correct ply sock adjustment, and proper wear schedule/adjustment Donning prosthesis: SBA patient needs cues for new suspension system with suction ring Doffing prosthesis: Modified independence Prosthetic wear tolerance: Patient reports wearing prosthesis most of awake hours on days that he is leaving his home.  On days that he stays home he reports not wearing the prosthesis at all. Prosthetic weight bearing tolerance: 10 minutes with no increase in residual limb pain reported Edema: None Residual limb condition: No open areas, cylindrical shape, normal temperature and moisture level.  Prosthetic description: Silicone liner with suction ring suspension, ischial containment socket with flexible inner socket.  Multiaxial knee.     TODAY'S TREATMENT:                                                                                                                             DATE:  02/29/2024: Prosthetic Care with transfemoral prosthesis: See patient education below.  PATIENT EDUCATION: PATIENT EDUCATED ON FOLLOWING PROSTHETIC CARE: Education details: Sitting positioning,  sit to stand technique, Prosthetic cleaning, proper donning with suction ring suspension, and Proper wear schedule/adjustment Prosthetic wear tolerance: Recommending to wear  daily most of awake hours whether he is staying home or leaving his home.  Person educated: Patient and Spouse Education method: Explanation, Demonstration, Tactile cues, and Verbal cues Education comprehension: verbalized understanding, verbal cues required, tactile cues required, and needs  further education  HOME EXERCISE PROGRAM:  ASSESSMENT:  CLINICAL IMPRESSION: Patient is a 68 y.o. male who was seen today for physical therapy evaluation and treatment for prosthetic training with right transfemoral amputation.  Patient has deconditioning with tightness and weakness in lower extremities due to prolonged limited activity level.  Patient received a socket revision with a new type of suspension with no training.  He is dependent in several aspects of prosthetic care and how to properly don his prosthesis.  Patient does report that his prosthesis has fallen off several times which are part of his issue with falls.  Patient reports numerous falls.  Berg balance test 20/56 indicates high fall risk and dependency and standing ADLs.  Patient's prosthetic gait is dependent and unsafe with deviations indicating high fall risk.  Patient would benefit from skilled physical therapy to improve his mobility and safety.  OBJECTIVE IMPAIRMENTS: Abnormal gait, decreased activity tolerance, decreased balance, decreased endurance, decreased knowledge of condition, decreased knowledge of use of DME, decreased mobility, difficulty walking, decreased ROM, decreased strength, postural dysfunction, prosthetic dependency , and pain.   ACTIVITY LIMITATIONS: carrying, lifting, sitting, standing, stairs, transfers, and locomotion level  PARTICIPATION LIMITATIONS: meal prep, cleaning, and community activity  PERSONAL FACTORS: Age, Past/current experiences, Time since onset of injury/illness/exacerbation, and 3+ comorbidities: see PMH are also affecting patient's functional outcome.   REHAB POTENTIAL: Good  CLINICAL DECISION MAKING: Evolving/moderate complexity  EVALUATION COMPLEXITY: Moderate   GOALS: Goals reviewed with patient? Yes  SHORT TERM GOALS: Target date: 03/30/2024  Patient donnes prosthesis modified independent correctly Baseline: SEE OBJECTIVE DATA Goal status: INITIAL 2.  Patient  tolerates prosthesis wear daily for >12 hours without skin issues or limb pain over 5/10 Baseline: SEE OBJECTIVE DATA Goal status: INITIAL  3.  Patient able to reach 7 and look over both shoulders without UE support with supervision. Baseline: SEE OBJECTIVE DATA Goal status: INITIAL  4. Patient ambulates 200' with cruthes & prosthesis with supervision with prosthetic knee flexion >50% of steps for swing phase. Baseline: SEE OBJECTIVE DATA Goal status: INITIAL  5. Patient negotiates ramps & curbs with crutches & prosthesis with supervision. Baseline: SEE OBJECTIVE DATA Goal status: INITIAL  LONG TERM GOALS: Target date: 05/27/2023  Patient demonstrates & verbalized understanding of prosthetic care to enable safe utilization of prosthesis. Baseline: SEE OBJECTIVE DATA Goal status: INITIAL  Patient tolerates prosthesis wear >90% of awake hours without skin or limb pain >2/10 issues. Baseline: SEE OBJECTIVE DATA Goal status: INITIAL  Berg Balance >36/56 to indicate lower fall risk Baseline: SEE OBJECTIVE DATA Goal status: INITIAL  Patient ambulates >250' with prosthesis with cane or less independently Baseline: SEE OBJECTIVE DATA Goal status: INITIAL  Patient negotiates ramps, curbs & stairs with single rail with prosthesis  and cane or less independently. Baseline: SEE OBJECTIVE DATA Goal status: INITIAL  8.  Patient reports Patient-Specific Activity Score improved to average 8 to indicate improvement in functional activities.   Baseline: SEE OBJECTIVE DATA Goal status: INITIAL  PLAN:  PT FREQUENCY: 2x/week  PT DURATION: 12 weeks  PLANNED INTERVENTIONS: [id number]- PT Re-evaluation, 97750- Physical Performance Testing, 97110-Therapeutic exercises, 97530- Therapeutic activity, [id number]- Neuromuscular re-education, 97535- Self Care, [id number]- Gait training, [phone]- Prosthetic Initial , [phone]- Orthotic/Prosthetic subsequent, Patient/Family education, Balance training,  and Stair  training  PLAN FOR NEXT SESSION:   check donning, set up HEP for balance, prosthetic gait with crutches working on terminal stance & swing prosthetic knee flexion   Grayce Spatz, PT, DPT 02/29/2024, 5:04 PM  Date of referral: 02/08/2024 Referring provider: Jerona Sage Referring diagnosis? Z89.611 (ICD-10-CM) - Hx of AKA (above knee amputation), right  Treatment diagnosis? (if different than referring diagnosis) R26.89, R29.6, R26.81, M62.81, M25.651  What was this (referring dx) caused by? Surgery (Type: right AKA) and Felton Hawks of Condition: Recurrent (multiple episodes of < 3 months)   Laterality: Rt  Current Functional Measure Score:  Berg Balance 20/56  Objective measurements identify impairments when they are compared to normal values, the uninvolved extremity, and prior level of function.  [x]  Yes  []  No  Objective assessment of functional ability: Moderate functional limitations   Briefly describe symptoms:  patient is dependent in prosthetic care.  Patient has recurrent falls and Berg Balance 20/56 indicates high fall risk.  Patient has dependent prosthetic gait with deviations indicating high fall risk.    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  He is dependent in use especially donning suction ring suspension correctly.   Average pain intensity:  Last 24 hours: 7/10  Past week: 7/10  How often does the pt experience symptoms? Frequently  How much have the symptoms interfered with usual daily activities? Quite a bit  How has condition changed since care began at this facility? NA - initial visit  In general, how is the patients overall health? Good   BACK PAIN (STarT Back Screening Tool) No

## 2024-03-04 ENCOUNTER — Ambulatory Visit (INDEPENDENT_AMBULATORY_CARE_PROVIDER_SITE_OTHER)

## 2024-03-04 DIAGNOSIS — M6281 Muscle weakness (generalized): Secondary | ICD-10-CM

## 2024-03-04 DIAGNOSIS — R2681 Unsteadiness on feet: Secondary | ICD-10-CM

## 2024-03-04 DIAGNOSIS — R2689 Other abnormalities of gait and mobility: Secondary | ICD-10-CM

## 2024-03-04 DIAGNOSIS — M25651 Stiffness of right hip, not elsewhere classified: Secondary | ICD-10-CM | POA: Diagnosis not present

## 2024-03-04 DIAGNOSIS — R296 Repeated falls: Secondary | ICD-10-CM

## 2024-03-04 NOTE — Therapy (Addendum)
 OUTPATIENT PHYSICAL THERAPY PROSTHETICS TREATMENT   Patient Name: Mark [name]. MRN: [id number] DOB:January 20, 1956, 68 y.o., male Today's Date: 03/04/2024  END OF SESSION:  PT End of Session - 03/04/24 1432     Visit Number 2    Number of Visits 25    Date for Recertification  05/26/24    Authorization Type [employer] state Health and [facility] Medicare    Progress Note Due on Visit 10    PT Start Time 1432    PT Stop Time 1520    PT Time Calculation (min) 48 min    Activity Tolerance Patient tolerated treatment well    Behavior During Therapy WFL for tasks assessed/performed          Past Medical History:  Diagnosis Date   Alcohol  abuse    Anemia    Arthritis    Ascites    Cirrhosis ([name])    Coffee ground emesis    Dehydration 06/17/2017   Febrile illness    GERD (gastroesophageal reflux disease)    Heart murmur    History of blood transfusion    Hyperlipidemia    Hypertension    Leg swelling    Myocardial infarction ([name]) 2012   Preop cardiovascular exam 04/14/2013   Sepsis ([name]) 06/17/2017   Septic shock ([name]) 06/18/2017   SIRS (systemic inflammatory response syndrome) ([name]) 07/11/2017   Stroke ([name]) 10/2009   TIA   Thrombocytopenia    Past Surgical History:  Procedure Laterality Date   AMPUTATION Right 11/07/2022   Procedure: RIGHT ABOVE KNEE AMPUTATION;  Surgeon: Harden Jerona GAILS, MD;  Location: [facility] OR;  Service: Orthopedics;  Laterality: Right;   COLONOSCOPY WITH PROPOFOL  N/A 02/07/2016   Procedure: COLONOSCOPY WITH PROPOFOL ;  Surgeon: Toribio SHAUNNA Cedar, MD;  Location: [facility];  Service: Endoscopy;  Laterality: N/A;   ERCP N/A 02/28/2023   Procedure: ENDOSCOPIC RETROGRADE CHOLANGIOPANCREATOGRAPHY (ERCP);  Surgeon: Rollin Dover, MD;  Location: [facility];  Service: Gastroenterology;  Laterality: N/A;   ESOPHAGOGASTRODUODENOSCOPY (EGD) WITH PROPOFOL  N/A 02/07/2016   Procedure: ESOPHAGOGASTRODUODENOSCOPY (EGD) WITH PROPOFOL ;  Surgeon: Toribio SHAUNNA Cedar, MD;   Location: [facility];  Service: Endoscopy;  Laterality: N/A;   ESOPHAGOGASTRODUODENOSCOPY (EGD) WITH PROPOFOL  N/A 06/22/2017   Procedure: ESOPHAGOGASTRODUODENOSCOPY (EGD) WITH PROPOFOL ;  Surgeon: Albertus Gordy HERO, MD;  Location: [facility] ENDOSCOPY;  Service: Gastroenterology;  Laterality: N/A;   EXCISIONAL TOTAL KNEE ARTHROPLASTY WITH ANTIBIOTIC SPACERS Right 03/20/2020   Procedure: Resection right total knee arthroplasty and placement of antibiotic spacer;  Surgeon: Ernie Cough, MD;  Location: [facility] ORS;  Service: Orthopedics;  Laterality: Right;  90 mins   HERNIA REPAIR Right    inguinal   INGUINAL HERNIA REPAIR Left 02/08/2021   Procedure: OPEN LEFT INGUINAL HERNIA REPAIR WITH MESH;  Surgeon: Vernetta Berg, MD;  Location: [facility] OR;  Service: General;  Laterality: Left;   IRRIGATION AND DEBRIDEMENT KNEE Right 11/13/2020   Procedure: IRRIGATION AND DEBRIDEMENT KNEE;  Surgeon: Ernie Cough, MD;  Location: [facility]S;  Service: Orthopedics;  Laterality: Right;   JOINT REPLACEMENT     KNEE ARTHROSCOPY     bilateral/  12/14   REIMPLANTATION OF TOTAL KNEE Right 08/02/2020   Procedure: REIMPLANTATION/REVISION OF TOTAL KNEE WITH REMOVAL OF ANTIBIOTIC SPACER;  Surgeon: Ernie Cough, MD;  Location: [facility];  Service: Orthopedics;  Laterality: Right;    REMOVAL OF STONES  02/28/2023   Procedure: REMOVAL OF DEBRIS;  Surgeon: Rollin Dover, MD;  Location: [facility];  Service: Gastroenterology;;   ANNETT  02/28/2023  Procedure: SPHINCTEROTOMY;  Surgeon: Rollin Dover, MD;  Location: [facility];  Service: Gastroenterology;;   TEE WITHOUT CARDIOVERSION N/A 05/13/2018   Procedure: TRANSESOPHAGEAL ECHOCARDIOGRAM (TEE);  Surgeon: Loni Soyla LABOR, MD;  Location: [facility] ENDOSCOPY;  Service: Cardiovascular;  Laterality: N/A;   TOTAL KNEE ARTHROPLASTY Right 05/02/2013   Procedure: RIGHT TOTAL KNEE ARTHROPLASTY;  Surgeon: Donnice JONETTA Car, MD;  Location: [facility];  Service: Orthopedics;  Laterality: Right;    Patient Active Problem List   Diagnosis Date Noted   Acute blood loss anemia 03/02/2023   Choledocholithiasis 02/28/2023   HTN (hypertension) 02/28/2023   Hypokalemia 02/28/2023   Cholelithiasis with biliary obstruction 02/27/2023   Infection of total knee replacement 11/07/2022   Hx of AKA (above knee amputation), right ([name]) 11/07/2022   Cerebrovascular disease 07/08/2022   Renal cyst 07/08/2022   Protein-calorie malnutrition, severe 07/08/2022   Toxic metabolic encephalopathy 02/26/2022   Septic joint of right knee joint ([name]) 11/08/2020   S/P right TKA reimplantation 08/02/2020   Infection of total left knee replacement 03/20/2020   Cirrhosis of liver without ascites ([name])    AKI (acute kidney injury)    Bacteremia    Spinal stenosis of lumbar region without neurogenic claudication    Aspiration pneumonia ([name]) 09/24/2018   Abscess in epidural space of lumbar spine    MRSA bacteremia 09/21/2018   Infection of prosthetic right knee joint 09/20/2018   Anemia of chronic disease 09/20/2018   Hypoalbuminemia 09/20/2018   Hypoglycemia without diagnosis of diabetes mellitus 09/20/2018   Epidural abscess 09/20/2018   Hyperkalemia 05/18/2018   Edentulous 05/11/2018   Pancytopenia ([name]) 05/10/2018   CAD (coronary artery disease) 05/10/2018   Hepatic encephalopathy ([name]) 05/10/2018   Alcohol  abuse 05/10/2018   GERD (gastroesophageal reflux disease) 05/10/2018   Duodenal ulcer    Renal failure    Acute on chronic anemia    Hypotension 06/17/2017   Hyponatremia 06/17/2017   Leg edema, right 06/17/2017   Acute metabolic encephalopathy 06/17/2017   Anemia, iron  deficiency    Benign neoplasm of ascending colon    Hemorrhoids    Portal hypertensive gastropathy ([name])    Gastritis and gastroduodenitis    Esophageal varices without bleeding ([name])    H/O: CVA (cerebrovascular accident) 12/01/2013   ACS (acute coronary syndrome) ([name]) 12/01/2013   Anasarca 12/01/2013    Polysubstance abuse ([name]) 12/01/2013   CKD (chronic kidney disease) stage 3, GFR 30-59 ml/min ([name]) 12/01/2013   S/P right TKA 05/02/2013   Murmur 04/14/2013   Right inguinal hernia 12/26/2010   Cirrhosis, alcoholic ([name]) 12/20/2010    PCP: Mercer Clotilda SAUNDERS, MD  REFERRING PROVIDER: Harden Jerona GAILS, MD  ONSET DATE: 02/08/2024 MD referral to PT  REFERRING DIAG: Z89.611 (ICD-10-CM) - Hx of AKA (above knee amputation), right   THERAPY DIAG:  Other abnormalities of gait and mobility  Repeated falls  Unsteadiness on feet  Muscle weakness (generalized)  Stiffness of right hip, not elsewhere classified  Rationale for Evaluation and Treatment: Rehabilitation  SUBJECTIVE:   SUBJECTIVE STATEMENT: Patient reports that it took him 10 minutes to don prosthesis as it felt like it is too tight for appropriate wear. Patient endorses pulling sensation in anterior thigh/hip when trying to stand up tall as if the prosthesis is pulling on all his skin.  PERTINENT HISTORY: right TFA 11/07/22, inguinal hernia repair BLEs, ETOH abuse, cirrhosis, arthritis, GERD, HTN, HLD, heart murmur, MI, TIA 2011, systemic inflammatory response syndrome   Patient underwent a right Transfemoral Amputation on 11/07/2022  due to infected TKA. Initial right TKA 2015 with antibiotic spacers 2021 & 2022 with I&D. He got prosthesis on 02/10/2023. He had got new socket in April 2025.  The prosthesis slides off some times.  He is wearing prosthesis daily most of awake hours except if he is staying home.     PAIN:  NPRS scale: not rated this date  Pain location: right residual limb Pain description: soreness Aggravating factors: unknown Relieving factors: it goes away on its own  PRECAUTIONS: Fall  WEIGHT BEARING RESTRICTIONS: No  FALLS:  Has patient fallen in last 6 months? Yes. Number of falls >4 x/month. He denies injuries.  If prosthesis twist and catches prosthesis going up steps.   LIVING ENVIRONMENT: Lives  with: lives with their spouse and dog 60#  Lives in: House Home Access: Stairs to enter Home layout: One level Stairs: Yes: External: 3 steps to porch without rails and threshold into house Has following equipment at home: Single point cane, Environmental Consultant - 2 wheeled, Chief Operating Officer, Wheelchair (manual), and Tour manager  OCCUPATION:  retired does odd jobs lobbyist, estate manager/land agent, cut wood   PLOF: Independent  PATIENT GOALS:   He wants to walk   Next MD visit:  OBJECTIVE:   PATIENT SURVEYS:  Patient-Specific Activity Scoring Scheme  0 represents "unable to perform." 10 represents "able to perform at prior level. 0 1 2 3 4 5 6 7 8 9  10 (Date and Score)  Activity Eval     1.   Standing ADLs   8    2.  Walking with prosthesis  4    3.  Repair cars, yard work, chop wood 6   4.    5.    Score 6    Total score = sum of the activity scores/number of activities Minimum detectable change (90%CI) for average score = 2 points Minimum detectable change (90%CI) for single activity score = 3 points  COGNITION: Overall cognitive status: WFL    SENSATION: WFL  MUSCLE LENGTH: Thomas test: Right -25 deg;  POSTURE:  rounded shoulders, forward head, flexed trunk , and weight shift left  LOWER EXTREMITY ROM:   ROM Left eval  Hip flexion   Hip extension -12*  Hip abduction   Hip adduction   Hip internal rotation   Hip external rotation   Knee flexion   Knee extension   Ankle dorsiflexion   Ankle plantarflexion   Ankle inversion   Ankle eversion    (Blank rows = not tested)  LOWER EXTREMITY MMT:  MMT Left eval  Hip flexion   Hip extension 4/5  Hip abduction 4/5  Hip adduction   Hip internal rotation   Hip external rotation   Knee flexion   Knee extension   Ankle dorsiflexion   Ankle plantarflexion   Ankle inversion   Ankle eversion    (Blank rows = not tested)  TRANSFERS: Sit to stand: SBA from 18 chair with armrest uses back of legs against chair to  stabilize and requires external support of crutches to position lower extremities for stabilization. Stand to sit: SBA requires use of armrest to control descent to 18 chair  FUNCTIONAL TESTs:  Evaluation on 02/29/2024: Lars Balance 20/56  [facility] PT Assessment - 02/29/24 1300       Standardized Balance Assessment   Standardized Balance Assessment Berg Balance Test      Berg Balance Test   Sit to Stand Needs minimal aid to stand or to stabilize  Standing Unsupported Able to stand 2 minutes with supervision    Sitting with Back Unsupported but Feet Supported on Floor or Stool Able to sit safely and securely 2 minutes    Stand to Sit Controls descent by using hands    Transfers Able to transfer safely, definite need of hands    Standing Unsupported with Eyes Closed Unable to keep eyes closed 3 seconds but stays steady    Standing Unsupported with Feet Together Needs help to attain position but able to stand for 30 seconds with feet together    From Standing, Reach Forward with Outstretched Arm Reaches forward but needs supervision    From Standing Position, Pick up Object from Floor Unable to pick up and needs supervision    From Standing Position, Turn to Look Behind Over each Shoulder Needs supervision when turning    Turn 360 Degrees Needs assistance while turning    Standing Unsupported, Alternately Place Feet on Step/Stool Needs assistance to keep from falling or unable to try    Standing Unsupported, One Foot in [employer] balance while stepping or standing    Standing on One Leg Tries to lift leg/unable to hold 3 seconds but remains standing independently    Total Score 20    Berg comment: BERG  < 36 high risk for falls (close to 100%) 46-51 moderate (>50%)   37-45 significant (>80%) 52-55 lower (> 25%)         GAIT: Gait pattern: step to pattern, decreased step length- Left, decreased stance time- Right, decreased hip/knee flexion- Right, circumduction- Right, Right hip hike,  antalgic, trunk flexed, and abducted- Right patient is fearful to unlock his prosthesis for swing phase. Pistoning noted with prosthesis due to suspension issue. Distance walked: 120' Assistive device utilized: Crutches and transfemoral prosthesis Level of assistance: SBA only when using crutches.  He would require greater assistance if he was using a cane or no device.  Gait velocity: 1.37 ft/sec Comments: Excessive weightbearing on axillary crutches  CURRENT PROSTHETIC WEAR ASSESSMENT: Evaluation on 02/29/2024: Patient is independent with: skin check Patient is dependent with: prosthetic cleaning, correct ply sock adjustment, and proper wear schedule/adjustment Donning prosthesis: SBA patient needs cues for new suspension system with suction ring Doffing prosthesis: Modified independence Prosthetic wear tolerance: Patient reports wearing prosthesis most of awake hours on days that he is leaving his home.  On days that he stays home he reports not wearing the prosthesis at all. Prosthetic weight bearing tolerance: 10 minutes with no increase in residual limb pain reported Edema: None Residual limb condition: No open areas, cylindrical shape, normal temperature and moisture level.  Prosthetic description: Silicone liner with suction ring suspension, ischial containment socket with flexible inner socket.  Multiaxial knee.  TODAY'S TREATMENT:                                                                                                                             DATE:  03/05/2023:  Prosthetic Care with transfemoral prosthesis: Patient independently doffed prosthesis and silicone liner in order for PT to assess skin as well as assess doffing and donning process. No skin lesions or breakdown noted during skin check that would support ill-fitting prosthesis. Patient verbalizing each step and appropriately donned transfemoral prosthesis using method that PT taught him at initial evaluation. Patient  then performed a sit<>stand from mat table and described pulling sensation again. PT educated patient on hip flexion contracture that would explain the pulling sensation in the front of the thigh. However, patient was informed that if the discomfort continues after given hip flexor stretch (to be given at next session) and working on goals, patient would need to reach out to prosthetist for a socket fit check. Patient and spouse acknowledge and agree to plan.  Patient endorsed an episode of falling due to moment of forgetting crutches when chasing after the dog after lying back down while he didn't have his prosthesis on. PT educated patient on importance of putting prosthesis on as soon as he wakes up as there will be moments like these that occur where he will need quick movements and to be as safe as possible, prosthesis will at least keep him from falling onto residual limb. Patient acknowledged and agreed.    Neuro Re-Ed:  PT demonstrated below exercises and then had patient perform 1x10 of each activity while holding on to sink with CGA and verbal cues throughout. Even with verbal and tactile cues, patient returned to downward gaze and increased hip flexion after a few reps of each activity. One instance of slight Rt knee instability, but patient able to self correct with UE use on sink and no required increase in physical assist. Printout of below activities provided for patient and spouse. Do each exercise 1-2  times per day Do each exercise 5-10 repetitions Hold each exercise for 2 seconds to feel your location  AT SINK FIND YOUR MIDLINE POSITION AND PLACE FEET EQUAL DISTANCE FROM THE MIDLINE.  Try to find this position when standing still for activities.   USE TAPE ON FLOOR TO MARK THE MIDLINE POSITION which is even with middle of sink.  You also should try to feel with your limb pressure in socket.  You are trying to feel with limb what you used to feel with the bottom of your foot.  Side to  Side Shift: Moving your hips only (not shoulders): move weight onto your left leg, HOLD/FEEL pressure in socket.  Move back to equal weight on each leg, HOLD/FEEL pressure in socket. Move weight onto your right leg, HOLD/FEEL pressure in socket. Move back to equal weight on each leg, HOLD/FEEL pressure in socket. Repeat.  Start with both hands on sink, progress to hand on prosthetic side only, then no hands.  Front to Back Shift: Moving your hips only (not shoulders): move your weight forward onto your toes, HOLD/FEEL pressure in socket. Move your weight back to equal Flat Foot on both legs, HOLD/FEEL  pressure in socket. Move your weight back onto your heels, HOLD/FEEL  pressure in socket. Move your weight back to equal on both legs, HOLD/FEEL  pressure in socket. Repeat.  Start with both hands on sink, progress to hand on prosthetic side only, then no hands.  Moving Cones / Cups: With equal weight on each leg: Hold on with one hand the first time, then progress to no hand supports. Move cups from one side of sink to the other. Place cups ~2" out of  your reach, progress to 10" beyond reach.  Place one hand in middle of sink and reach with other hand. Do both arms.  Then hover one hand and move cups with other hand.  Overhead/Upward Reaching: alternated reaching up to top cabinets or ceiling if no cabinets present. Keep equal weight on each leg. Start with one hand support on counter while other hand reaches and progress to no hand support with reaching.  ace one hand in middle of sink and reach with other hand. Do both arms.  Then hover one hand and move cups with other hand.  5.    Looking Over Shoulders: With equal weight on each leg: alternate turning to look over your shoulders with one hand support on counter as needed.  Start with head motions only to look in front of shoulder, then even with shoulder and progress to looking behind you. To look to side, move head /eyes, then shoulder on side looking pulls  back, shift more weight to side looking and pull hip back. Place one hand in middle of sink and let go with other hand so your shoulder can pull back. Switch hands to look other way.   Then hover one hand and look over shoulder. If looking right, use left hand at sink. If looking left, use right hand at sink. 6.  Stepping with leg that is not amputated:  Move items under cabinet out of your way. Shift your hips/pelvis so weight on prosthesis. Tighten muscles in hip on prosthetic side.  SLOWLY step other leg so front of foot is in cabinet. Then step back to floor.   TODAY'S TREATMENT:                                                                                                                             DATE:  03/01/2023: Prosthetic Care with transfemoral prosthesis: See patient education below.  PATIENT EDUCATION: PATIENT EDUCATED ON FOLLOWING PROSTHETIC CARE: Education details: Sitting positioning,  sit to stand technique, Prosthetic cleaning, proper donning with suction ring suspension, and Proper wear schedule/adjustment Prosthetic wear tolerance: Recommending to wear daily most of awake hours whether he is staying home or leaving his home.  Person educated: Patient and Spouse Education method: Explanation, Demonstration, Tactile cues, and Verbal cues Education comprehension: verbalized understanding, verbal cues required, tactile cues required, and needs further education  HOME EXERCISE PROGRAM:  ASSESSMENT:  CLINICAL IMPRESSION: Patient arrived to session noting difficulty in donning prosthesis this morning, though has been able to improve his technique and fit since initial evaluation. Patient was able to independently doff and don prosthesis without requirement for physical assist, though did require SBA as he was slightly unstable. Patient tolerated HEP balance activities to increase weightbearing and shifting through that Rt LE, however, required several verbal and tactile cues for  appropriate form. Patient will continue to benefit from skilled PT.  OBJECTIVE IMPAIRMENTS: Abnormal gait, decreased activity tolerance, decreased balance, decreased endurance, decreased knowledge of  condition, decreased knowledge of use of DME, decreased mobility, difficulty walking, decreased ROM, decreased strength, postural dysfunction, prosthetic dependency , and pain.   ACTIVITY LIMITATIONS: carrying, lifting, sitting, standing, stairs, transfers, and locomotion level  PARTICIPATION LIMITATIONS: meal prep, cleaning, and community activity  PERSONAL FACTORS: Age, Past/current experiences, Time since onset of injury/illness/exacerbation, and 3+ comorbidities: see PMH are also affecting patient's functional outcome.   REHAB POTENTIAL: Good  CLINICAL DECISION MAKING: Evolving/moderate complexity  EVALUATION COMPLEXITY: Moderate   GOALS: Goals reviewed with patient? Yes  SHORT TERM GOALS: Target date: 03/30/2024  Patient donnes prosthesis modified independent correctly Baseline: SEE OBJECTIVE DATA Goal status: INITIAL 2.  Patient tolerates prosthesis wear daily for >12 hours without skin issues or limb pain over 5/10 Baseline: SEE OBJECTIVE DATA Goal status: INITIAL  3.  Patient able to reach 7 and look over both shoulders without UE support with supervision. Baseline: SEE OBJECTIVE DATA Goal status: INITIAL  4. Patient ambulates 200' with cruthes & prosthesis with supervision with prosthetic knee flexion >50% of steps for swing phase. Baseline: SEE OBJECTIVE DATA Goal status: INITIAL  5. Patient negotiates ramps & curbs with crutches & prosthesis with supervision. Baseline: SEE OBJECTIVE DATA Goal status: INITIAL  LONG TERM GOALS: Target date: 05/27/2023  Patient demonstrates & verbalized understanding of prosthetic care to enable safe utilization of prosthesis. Baseline: SEE OBJECTIVE DATA Goal status: INITIAL  Patient tolerates prosthesis wear >90% of awake hours  without skin or limb pain >2/10 issues. Baseline: SEE OBJECTIVE DATA Goal status: INITIAL  Berg Balance >36/56 to indicate lower fall risk Baseline: SEE OBJECTIVE DATA Goal status: INITIAL  Patient ambulates >250' with prosthesis with cane or less independently Baseline: SEE OBJECTIVE DATA Goal status: INITIAL  Patient negotiates ramps, curbs & stairs with single rail with prosthesis  and cane or less independently. Baseline: SEE OBJECTIVE DATA Goal status: INITIAL  8.  Patient reports Patient-Specific Activity Score improved to average 8 to indicate improvement in functional activities.   Baseline: SEE OBJECTIVE DATA Goal status: INITIAL  PLAN:  PT FREQUENCY: 2x/week  PT DURATION: 12 weeks  PLANNED INTERVENTIONS: 97164- PT Re-evaluation, 97750- Physical Performance Testing, 97110-Therapeutic exercises, 97530- Therapeutic activity, 97112- Neuromuscular re-education, [phone]- Self Care, [id number]- Gait training, [phone]- Prosthetic Initial , [phone]- Orthotic/Prosthetic subsequent, Patient/Family education, Balance training, and Stair training  PLAN FOR NEXT SESSION:    check donning, set up HEP for balance (will need to review), prosthetic gait with crutches working on terminal stance & swing prosthetic knee flexion, postural strengthening/flexibility, hip flexor stretch for HEP    Susannah Daring, PT, DPT 03/04/24 3:39 PM    Date of referral: 02/08/2024 Referring provider: Jerona Sage Referring diagnosis? Z89.611 (ICD-10-CM) - Hx of AKA (above knee amputation), right  Treatment diagnosis? (if different than referring diagnosis) R26.89, R29.6, R26.81, M62.81, M25.651  What was this (referring dx) caused by? Surgery (Type: right AKA) and Felton Hawks of Condition: Recurrent (multiple episodes of < 3 months)   Laterality: Rt  Current Functional Measure Score:  Berg Balance 20/56  Objective measurements identify impairments when they are compared to normal values, the uninvolved  extremity, and prior level of function.  [x]  Yes  []  No  Objective assessment of functional ability: Moderate functional limitations   Briefly describe symptoms:  patient is dependent in prosthetic care.  Patient has recurrent falls and Berg Balance 20/56 indicates high fall risk.  Patient has dependent prosthetic gait with deviations indicating high fall risk.    How did symptoms start:  patient had infection in Total Knee Arthroplasty that required amputation.  He is dependent in use especially donning suction ring suspension correctly.   Average pain intensity:  Last 24 hours: 7/10  Past week: 7/10  How often does the pt experience symptoms? Frequently  How much have the symptoms interfered with usual daily activities? Quite a bit  How has condition changed since care began at this facility? NA - initial visit  In general, how is the patients overall health? Good   BACK PAIN (STarT Back Screening Tool) No

## 2024-03-14 ENCOUNTER — Encounter: Payer: Self-pay | Admitting: Physical Therapy

## 2024-03-14 ENCOUNTER — Ambulatory Visit (INDEPENDENT_AMBULATORY_CARE_PROVIDER_SITE_OTHER): Admitting: Physical Therapy

## 2024-03-14 DIAGNOSIS — M6281 Muscle weakness (generalized): Secondary | ICD-10-CM | POA: Diagnosis not present

## 2024-03-14 DIAGNOSIS — R296 Repeated falls: Secondary | ICD-10-CM | POA: Diagnosis not present

## 2024-03-14 DIAGNOSIS — R2689 Other abnormalities of gait and mobility: Secondary | ICD-10-CM

## 2024-03-14 DIAGNOSIS — R2681 Unsteadiness on feet: Secondary | ICD-10-CM | POA: Diagnosis not present

## 2024-03-14 DIAGNOSIS — M25651 Stiffness of right hip, not elsewhere classified: Secondary | ICD-10-CM

## 2024-03-14 NOTE — Therapy (Signed)
 OUTPATIENT PHYSICAL THERAPY PROSTHETIC TREATMENT   Patient Name: Mark [name]. MRN: [id number] DOB:May 21, 1955, 68 y.o., male Today's Date: 03/14/2024  END OF SESSION:  PT End of Session - 03/14/24 1527     Visit Number 3    Number of Visits 25    Date for Recertification  05/26/24    Authorization Type [employer] state Health and [facility] Medicare    Progress Note Due on Visit 10    PT Start Time 1520    PT Stop Time 1605    PT Time Calculation (min) 45 min    Activity Tolerance Patient tolerated treatment well    Behavior During Therapy WFL for tasks assessed/performed           Past Medical History:  Diagnosis Date   Alcohol  abuse    Anemia    Arthritis    Ascites    Cirrhosis ([name])    Coffee ground emesis    Dehydration 06/17/2017   Febrile illness    GERD (gastroesophageal reflux disease)    Heart murmur    History of blood transfusion    Hyperlipidemia    Hypertension    Leg swelling    Myocardial infarction ([name]) 2012   Preop cardiovascular exam 04/14/2013   Sepsis ([name]) 06/17/2017   Septic shock ([name]) 06/18/2017   SIRS (systemic inflammatory response syndrome) ([name]) 07/11/2017   Stroke ([name]) 10/2009   TIA   Thrombocytopenia    Past Surgical History:  Procedure Laterality Date   AMPUTATION Right 11/07/2022   Procedure: RIGHT ABOVE KNEE AMPUTATION;  Surgeon: Harden Jerona GAILS, MD;  Location: [facility] OR;  Service: Orthopedics;  Laterality: Right;   COLONOSCOPY WITH PROPOFOL  N/A 02/07/2016   Procedure: COLONOSCOPY WITH PROPOFOL ;  Surgeon: Toribio SHAUNNA Cedar, MD;  Location: [facility];  Service: Endoscopy;  Laterality: N/A;   ERCP N/A 02/28/2023   Procedure: ENDOSCOPIC RETROGRADE CHOLANGIOPANCREATOGRAPHY (ERCP);  Surgeon: Rollin Dover, MD;  Location: [facility];  Service: Gastroenterology;  Laterality: N/A;   ESOPHAGOGASTRODUODENOSCOPY (EGD) WITH PROPOFOL  N/A 02/07/2016   Procedure: ESOPHAGOGASTRODUODENOSCOPY (EGD) WITH PROPOFOL ;  Surgeon: Toribio SHAUNNA Cedar, MD;   Location: [facility];  Service: Endoscopy;  Laterality: N/A;   ESOPHAGOGASTRODUODENOSCOPY (EGD) WITH PROPOFOL  N/A 06/22/2017   Procedure: ESOPHAGOGASTRODUODENOSCOPY (EGD) WITH PROPOFOL ;  Surgeon: Albertus Gordy HERO, MD;  Location: [facility]OSCOPY;  Service: Gastroenterology;  Laterality: N/A;   EXCISIONAL TOTAL KNEE ARTHROPLASTY WITH ANTIBIOTIC SPACERS Right 03/20/2020   Procedure: Resection right total knee arthroplasty and placement of antibiotic spacer;  Surgeon: Ernie Cough, MD;  Location: [facility] ORS;  Service: Orthopedics;  Laterality: Right;  90 mins   HERNIA REPAIR Right    inguinal   INGUINAL HERNIA REPAIR Left 02/08/2021   Procedure: OPEN LEFT INGUINAL HERNIA REPAIR WITH MESH;  Surgeon: Vernetta Berg, MD;  Location: [facility] OR;  Service: General;  Laterality: Left;   IRRIGATION AND DEBRIDEMENT KNEE Right 11/13/2020   Procedure: IRRIGATION AND DEBRIDEMENT KNEE;  Surgeon: Ernie Cough, MD;  Location: [facility];  Service: Orthopedics;  Laterality: Right;   JOINT REPLACEMENT     KNEE ARTHROSCOPY     bilateral/  12/14   REIMPLANTATION OF TOTAL KNEE Right 08/02/2020   Procedure: REIMPLANTATION/REVISION OF TOTAL KNEE WITH REMOVAL OF ANTIBIOTIC SPACER;  Surgeon: Ernie Cough, MD;  Location: [facility];  Service: Orthopedics;  Laterality: Right;    REMOVAL OF STONES  02/28/2023   Procedure: REMOVAL OF DEBRIS;  Surgeon: Rollin Dover, MD;  Location: [facility];  Service: Gastroenterology;;   ANNETT  02/28/2023   Procedure: ANNETT;  Surgeon: Rollin Dover, MD;  Location: [facility];  Service: Gastroenterology;;   TEE WITHOUT CARDIOVERSION N/A 05/13/2018   Procedure: TRANSESOPHAGEAL ECHOCARDIOGRAM (TEE);  Surgeon: Loni Soyla LABOR, MD;  Location: [facility] ENDOSCOPY;  Service: Cardiovascular;  Laterality: N/A;   TOTAL KNEE ARTHROPLASTY Right 05/02/2013   Procedure: RIGHT TOTAL KNEE ARTHROPLASTY;  Surgeon: Donnice JONETTA Car, MD;  Location: [facility];  Service: Orthopedics;  Laterality: Right;    Patient Active Problem List   Diagnosis Date Noted   Acute blood loss anemia 03/02/2023   Choledocholithiasis 02/28/2023   HTN (hypertension) 02/28/2023   Hypokalemia 02/28/2023   Cholelithiasis with biliary obstruction 02/27/2023   Infection of total knee replacement 11/07/2022   Hx of AKA (above knee amputation), right ([name]) 11/07/2022   Cerebrovascular disease 07/08/2022   Renal cyst 07/08/2022   Protein-calorie malnutrition, severe 07/08/2022   Toxic metabolic encephalopathy 02/26/2022   Septic joint of right knee joint ([name]) 11/08/2020   S/P right TKA reimplantation 08/02/2020   Infection of total left knee replacement 03/20/2020   Cirrhosis of liver without ascites ([name])    AKI (acute kidney injury)    Bacteremia    Spinal stenosis of lumbar region without neurogenic claudication    Aspiration pneumonia ([name]) 09/24/2018   Abscess in epidural space of lumbar spine    MRSA bacteremia 09/21/2018   Infection of prosthetic right knee joint 09/20/2018   Anemia of chronic disease 09/20/2018   Hypoalbuminemia 09/20/2018   Hypoglycemia without diagnosis of diabetes mellitus 09/20/2018   Epidural abscess 09/20/2018   Hyperkalemia 05/18/2018   Edentulous 05/11/2018   Pancytopenia ([name]) 05/10/2018   CAD (coronary artery disease) 05/10/2018   Hepatic encephalopathy ([name]) 05/10/2018   Alcohol  abuse 05/10/2018   GERD (gastroesophageal reflux disease) 05/10/2018   Duodenal ulcer    Renal failure    Acute on chronic anemia    Hypotension 06/17/2017   Hyponatremia 06/17/2017   Leg edema, right 06/17/2017   Acute metabolic encephalopathy 06/17/2017   Anemia, iron  deficiency    Benign neoplasm of ascending colon    Hemorrhoids    Portal hypertensive gastropathy ([name])    Gastritis and gastroduodenitis    Esophageal varices without bleeding ([name])    H/O: CVA (cerebrovascular accident) 12/01/2013   ACS (acute coronary syndrome) ([name]) 12/01/2013   Anasarca 12/01/2013    Polysubstance abuse ([name]) 12/01/2013   CKD (chronic kidney disease) stage 3, GFR 30-59 ml/min ([name]) 12/01/2013   S/P right TKA 05/02/2013   Murmur 04/14/2013   Right inguinal hernia 12/26/2010   Cirrhosis, alcoholic ([name]) 12/20/2010    PCP: Mercer Clotilda SAUNDERS, MD  REFERRING PROVIDER: Harden Jerona GAILS, MD  ONSET DATE: 02/08/2024 MD referral to PT  REFERRING DIAG: Z89.611 (ICD-10-CM) - Hx of AKA (above knee amputation), right   THERAPY DIAG:  Other abnormalities of gait and mobility  Repeated falls  Unsteadiness on feet  Muscle weakness (generalized)  Stiffness of right hip, not elsewhere classified  Rationale for Evaluation and Treatment: Rehabilitation  SUBJECTIVE:   SUBJECTIVE STATEMENT: He is wearing prosthesis most of awake hours.  He is using PT recommendations for donning which is helping.  No falls.    PERTINENT HISTORY: right TFA 11/07/22, inguinal hernia repair BLEs, ETOH abuse, cirrhosis, arthritis, GERD, HTN, HLD, heart murmur, MI, TIA 2011, systemic inflammatory response syndrome   Patient underwent a right Transfemoral Amputation on 11/07/2022 due to infected TKA. Initial right TKA 2015 with antibiotic spacers 2021 & 2022 with I&D. He got  prosthesis on 02/10/2023. He had got new socket in April 2025.  The prosthesis slides off some times.  He is wearing prosthesis daily most of awake hours except if he is staying home.     PAIN:  NPRS scale: not rated this date  Pain location: right residual limb Pain description: soreness Aggravating factors: unknown Relieving factors: it goes away on its own  PRECAUTIONS: Fall  WEIGHT BEARING RESTRICTIONS: No  FALLS:  Has patient fallen in last 6 months? Yes. Number of falls >4 x/month. He denies injuries.  If prosthesis twist and catches prosthesis going up steps.   LIVING ENVIRONMENT: Lives with: lives with their spouse and dog 60#  Lives in: House Home Access: Stairs to enter Home layout: One level Stairs: Yes:  External: 3 steps to porch without rails and threshold into house Has following equipment at home: Single point cane, Environmental Consultant - 2 wheeled, Chief Operating Officer, Wheelchair (manual), and Tour manager  OCCUPATION:  retired does odd jobs lobbyist, estate manager/land agent, cut wood   PLOF: Independent  PATIENT GOALS:   He wants to walk   Next MD visit:  OBJECTIVE:   PATIENT SURVEYS:  Patient-Specific Activity Scoring Scheme  0 represents "unable to perform." 10 represents "able to perform at prior level. 0 1 2 3 4 5 6 7 8 9  10 (Date and Score)  Activity Eval     1.   Standing ADLs   8    2.  Walking with prosthesis  4    3.  Repair cars, yard work, chop wood 6   4.    5.    Score 6    Total score = sum of the activity scores/number of activities Minimum detectable change (90%CI) for average score = 2 points Minimum detectable change (90%CI) for single activity score = 3 points  COGNITION: Overall cognitive status: WFL    SENSATION: WFL  MUSCLE LENGTH: Evaluation on 02/29/2024: Debby test: Right -25 deg;  POSTURE:  Evaluation on 02/29/2024: rounded shoulders, forward head, flexed trunk , and weight shift left  LOWER EXTREMITY ROM:   ROM Left eval  Hip flexion   Hip extension -12*  Hip abduction   Hip adduction   Hip internal rotation   Hip external rotation   Knee flexion   Knee extension   Ankle dorsiflexion   Ankle plantarflexion   Ankle inversion   Ankle eversion    (Blank rows = not tested)  LOWER EXTREMITY MMT:  MMT Left eval  Hip flexion   Hip extension 4/5  Hip abduction 4/5  Hip adduction   Hip internal rotation   Hip external rotation   Knee flexion   Knee extension   Ankle dorsiflexion   Ankle plantarflexion   Ankle inversion   Ankle eversion    (Blank rows = not tested)  TRANSFERS: Evaluation on 02/29/2024: Sit to stand: SBA from 18 chair with armrest uses back of legs against chair to stabilize and requires external support of crutches  to position lower extremities for stabilization. Stand to sit: SBA requires use of armrest to control descent to 18 chair  FUNCTIONAL TESTs:  Evaluation on 02/29/2024: Berg Balance 20/56  [facility] PT Assessment - 02/29/24 1300       Standardized Balance Assessment   Standardized Balance Assessment Berg Balance Test      Berg Balance Test   Sit to Stand Needs minimal aid to stand or to stabilize    Standing Unsupported Able to stand 2 minutes with supervision  Sitting with Back Unsupported but Feet Supported on Floor or Stool Able to sit safely and securely 2 minutes    Stand to Sit Controls descent by using hands    Transfers Able to transfer safely, definite need of hands    Standing Unsupported with Eyes Closed Unable to keep eyes closed 3 seconds but stays steady    Standing Unsupported with Feet Together Needs help to attain position but able to stand for 30 seconds with feet together    From Standing, Reach Forward with Outstretched Arm Reaches forward but needs supervision    From Standing Position, Pick up Object from Floor Unable to pick up and needs supervision    From Standing Position, Turn to Look Behind Over each Shoulder Needs supervision when turning    Turn 360 Degrees Needs assistance while turning    Standing Unsupported, Alternately Place Feet on Step/Stool Needs assistance to keep from falling or unable to try    Standing Unsupported, One Foot in [employer] balance while stepping or standing    Standing on One Leg Tries to lift leg/unable to hold 3 seconds but remains standing independently    Total Score 20    Berg comment: BERG  < 36 high risk for falls (close to 100%) 46-51 moderate (>50%)   37-45 significant (>80%) 52-55 lower (> 25%)         GAIT: Evaluation on 02/29/2024: Gait pattern: step to pattern, decreased step length- Left, decreased stance time- Right, decreased hip/knee flexion- Right, circumduction- Right, Right hip hike, antalgic, trunk flexed,  and abducted- Right patient is fearful to unlock his prosthesis for swing phase. Pistoning noted with prosthesis due to suspension issue. Distance walked: 120' Assistive device utilized: Crutches and transfemoral prosthesis Level of assistance: SBA only when using crutches.  He would require greater assistance if he was using a cane or no device.  Gait velocity: 1.37 ft/sec Comments: Excessive weightbearing on axillary crutches  CURRENT PROSTHETIC WEAR ASSESSMENT: Evaluation on 02/29/2024: Patient is independent with: skin check Patient is dependent with: prosthetic cleaning, correct ply sock adjustment, and proper wear schedule/adjustment Donning prosthesis: SBA patient needs cues for new suspension system with suction ring Doffing prosthesis: Modified independence Prosthetic wear tolerance: Patient reports wearing prosthesis most of awake hours on days that he is leaving his home.  On days that he stays home he reports not wearing the prosthesis at all. Prosthetic weight bearing tolerance: 10 minutes with no increase in residual limb pain reported Edema: None Residual limb condition: No open areas, cylindrical shape, normal temperature and moisture level.  Prosthetic description: Silicone liner with suction ring suspension, ischial containment socket with flexible inner socket.  Multiaxial knee.    TODAY'S TREATMENT:                                                                                                                             DATE:  03/14/2024: Prosthetic Care with transfemoral prosthesis: PT verbal &  demo cues on step length with heel passing contralateral toes, weight shift over prosthesis in stance and upright posture.  Pt amb 75' X 2 with axillary crutches with supervision.  Pt amb 75' with forearm crutches with supervision.  PT cues on carryover of above.  Pt to try to get forearm crutches for Christmas present from family.    Therapeutic Activities:   Standing with  back to counter working on upright posture with ears over shoulders over hips over feet.   Back extension standing with pelvis ~ step in front of counter so he can catch himself if falling back and chair in front facing him.  Under control lean forward placing hands in chair seat then stand up straight for 5 sec.  10 reps with supervision.  Hip flexor stretch to facilitate upright posture.  Hooklying with RLE with prosthesis over edge 30 sec hold 3 reps.  PT giving light pressure to increase stretch. PT gave handout with verbal cues on above 3 exercises as HEP.     TREATMENT:                                                                                                                             DATE:  03/04/2024: Prosthetic Care with transfemoral prosthesis: Patient independently doffed prosthesis and silicone liner in order for PT to assess skin as well as assess doffing and donning process. No skin lesions or breakdown noted during skin check that would support ill-fitting prosthesis. Patient verbalizing each step and appropriately donned transfemoral prosthesis using method that PT taught him at initial evaluation. Patient then performed a sit<>stand from mat table and described pulling sensation again. PT educated patient on hip flexion contracture that would explain the pulling sensation in the front of the thigh. However, patient was informed that if the discomfort continues after given hip flexor stretch (to be given at next session) and working on goals, patient would need to reach out to prosthetist for a socket fit check. Patient and spouse acknowledge and agree to plan.  Patient endorsed an episode of falling due to moment of forgetting crutches when chasing after the dog after lying back down while he didn't have his prosthesis on. PT educated patient on importance of putting prosthesis on as soon as he wakes up as there will be moments like these that occur where he will need quick movements  and to be as safe as possible, prosthesis will at least keep him from falling onto residual limb. Patient acknowledged and agreed.    Neuro Re-Ed:  PT demonstrated below exercises and then had patient perform 1x10 of each activity while holding on to sink with CGA and verbal cues throughout. Even with verbal and tactile cues, patient returned to downward gaze and increased hip flexion after a few reps of each activity. One instance of slight Rt knee instability, but patient able to self correct with UE use on sink and no required increase in physical  assist. Printout of below activities provided for patient and spouse. Do each exercise 1-2  times per day Do each exercise 5-10 repetitions Hold each exercise for 2 seconds to feel your location  AT SINK FIND YOUR MIDLINE POSITION AND PLACE FEET EQUAL DISTANCE FROM THE MIDLINE.  Try to find this position when standing still for activities.   USE TAPE ON FLOOR TO MARK THE MIDLINE POSITION which is even with middle of sink.  You also should try to feel with your limb pressure in socket.  You are trying to feel with limb what you used to feel with the bottom of your foot.  Side to Side Shift: Moving your hips only (not shoulders): move weight onto your left leg, HOLD/FEEL pressure in socket.  Move back to equal weight on each leg, HOLD/FEEL pressure in socket. Move weight onto your right leg, HOLD/FEEL pressure in socket. Move back to equal weight on each leg, HOLD/FEEL pressure in socket. Repeat.  Start with both hands on sink, progress to hand on prosthetic side only, then no hands.  Front to Back Shift: Moving your hips only (not shoulders): move your weight forward onto your toes, HOLD/FEEL pressure in socket. Move your weight back to equal Flat Foot on both legs, HOLD/FEEL  pressure in socket. Move your weight back onto your heels, HOLD/FEEL  pressure in socket. Move your weight back to equal on both legs, HOLD/FEEL  pressure in socket. Repeat.  Start with  both hands on sink, progress to hand on prosthetic side only, then no hands.  Moving Cones / Cups: With equal weight on each leg: Hold on with one hand the first time, then progress to no hand supports. Move cups from one side of sink to the other. Place cups ~2" out of your reach, progress to 10" beyond reach.  Place one hand in middle of sink and reach with other hand. Do both arms.  Then hover one hand and move cups with other hand.  Overhead/Upward Reaching: alternated reaching up to top cabinets or ceiling if no cabinets present. Keep equal weight on each leg. Start with one hand support on counter while other hand reaches and progress to no hand support with reaching.  ace one hand in middle of sink and reach with other hand. Do both arms.  Then hover one hand and move cups with other hand.  5.    Looking Over Shoulders: With equal weight on each leg: alternate turning to look over your shoulders with one hand support on counter as needed.  Start with head motions only to look in front of shoulder, then even with shoulder and progress to looking behind you. To look to side, move head /eyes, then shoulder on side looking pulls back, shift more weight to side looking and pull hip back. Place one hand in middle of sink and let go with other hand so your shoulder can pull back. Switch hands to look other way.   Then hover one hand and look over shoulder. If looking right, use left hand at sink. If looking left, use right hand at sink. 6.  Stepping with leg that is not amputated:  Move items under cabinet out of your way. Shift your hips/pelvis so weight on prosthesis. Tighten muscles in hip on prosthetic side.  SLOWLY step other leg so front of foot is in cabinet. Then step back to floor.   TREATMENT:  DATE:  02/29/2024: Prosthetic Care with transfemoral prosthesis: See patient  education below.  PATIENT EDUCATION: PATIENT EDUCATED ON FOLLOWING PROSTHETIC CARE: Education details: Sitting positioning,  sit to stand technique, Prosthetic cleaning, proper donning with suction ring suspension, and Proper wear schedule/adjustment Prosthetic wear tolerance: Recommending to wear daily most of awake hours whether he is staying home or leaving his home.  Person educated: Patient and Spouse Education method: Explanation, Demonstration, Tactile cues, and Verbal cues Education comprehension: verbalized understanding, verbal cues required, tactile cues required, and needs further education  HOME EXERCISE PROGRAM: Access Code: [email or web address] URL: [email or web address] Date: 03/14/2024 Prepared by: Grayce Spatz  Exercises - Standing posture with back to counter  - 1-3 x daily - 7 x weekly - 1 sets - 1 reps - 2-5 minutes hold - standing back extension  - 1-3 x daily - 7 x weekly - 1 sets - 10 reps - 5 seconds hold - Modified Thomas Stretch  - 1-3 x daily - 7 x weekly - 1 sets - 3 reps - 30 seconds hold   ASSESSMENT:  CLINICAL IMPRESSION: Patient and his wife appear to understand HEP issued today.  PT worked on animator with step through pattern and control prosthetic step length so he has better weight shift over prosthesis in stance.  He has general understanding but will need repetition for better carryover.  Patient will continue to benefit from skilled PT.  OBJECTIVE IMPAIRMENTS: Abnormal gait, decreased activity tolerance, decreased balance, decreased endurance, decreased knowledge of condition, decreased knowledge of use of DME, decreased mobility, difficulty walking, decreased ROM, decreased strength, postural dysfunction, prosthetic dependency , and pain.   ACTIVITY LIMITATIONS: carrying, lifting, sitting, standing, stairs, transfers, and locomotion level  PARTICIPATION LIMITATIONS: meal prep, cleaning, and community activity  PERSONAL FACTORS: Age,  Past/current experiences, Time since onset of injury/illness/exacerbation, and 3+ comorbidities: see PMH are also affecting patient's functional outcome.   REHAB POTENTIAL: Good  CLINICAL DECISION MAKING: Evolving/moderate complexity  EVALUATION COMPLEXITY: Moderate   GOALS: Goals reviewed with patient? Yes  SHORT TERM GOALS: Target date: 03/30/2024  Patient donnes prosthesis modified independent correctly Baseline: SEE OBJECTIVE DATA Goal status: Ongoing  03/14/2024 2.  Patient tolerates prosthesis wear daily for >12 hours without skin issues or limb pain over 5/10 Baseline: SEE OBJECTIVE DATA Goal status: Ongoing  03/14/2024  3.  Patient able to reach 7 and look over both shoulders without UE support with supervision. Baseline: SEE OBJECTIVE DATA Goal status: Ongoing  03/14/2024  4. Patient ambulates 200' with cruthes & prosthesis with supervision with prosthetic knee flexion >50% of steps for swing phase. Baseline: SEE OBJECTIVE DATA Goal status:   Ongoing  03/14/2024  5. Patient negotiates ramps & curbs with crutches & prosthesis with supervision. Baseline: SEE OBJECTIVE DATA Goal status: Ongoing  03/14/2024  LONG TERM GOALS: Target date: 05/27/2023  Patient demonstrates & verbalized understanding of prosthetic care to enable safe utilization of prosthesis. Baseline: SEE OBJECTIVE DATA Goal status: Ongoing  03/14/2024  Patient tolerates prosthesis wear >90% of awake hours without skin or limb pain >2/10 issues. Baseline: SEE OBJECTIVE DATA Goal status: Ongoing  03/14/2024  Berg Balance >36/56 to indicate lower fall risk Baseline: SEE OBJECTIVE DATA Goal status: Ongoing  03/14/2024  Patient ambulates >250' with prosthesis with cane or less independently Baseline: SEE OBJECTIVE DATA Goal status: Ongoing  03/14/2024  Patient negotiates ramps, curbs & stairs with single rail with prosthesis  and cane or less independently. Baseline: SEE OBJECTIVE DATA Goal status:  Ongoing  03/14/2024  8.  Patient reports Patient-Specific Activity Score improved to average 8 to indicate improvement in functional activities.   Baseline: SEE OBJECTIVE DATA Goal status: Ongoing  03/14/2024  PLAN:  PT FREQUENCY: 2x/week  PT DURATION: 12 weeks  PLANNED INTERVENTIONS: 97164- PT Re-evaluation, 97750- Physical Performance Testing, 97110-Therapeutic exercises, 97530- Therapeutic activity, 97112- Neuromuscular re-education, [phone]- Self Care, [id number]- Gait training, [phone]- Prosthetic Initial , [phone]- Orthotic/Prosthetic subsequent, Patient/Family education, Balance training, and Stair training  PLAN FOR NEXT SESSION:   prosthetic gait with forearm crutches working on terminal stance & swing prosthetic knee flexion and weight shift over prosthesis in stance, postural strengthening/flexibility, check hip flexor stretch for HEP     Grayce Spatz, PT, DPT 03/14/2024, 4:23 PM   Date of referral: 02/08/2024 Referring provider: Jerona Sage Referring diagnosis? Z89.611 (ICD-10-CM) - Hx of AKA (above knee amputation), right  Treatment diagnosis? (if different than referring diagnosis) R26.89, R29.6, R26.81, M62.81, M25.651  What was this (referring dx) caused by? Surgery (Type: right AKA) and [place] Hawks of Condition: Recurrent (multiple episodes of < 3 months)   Laterality: Rt  Current Functional Measure Score:  Berg Balance 20/56  Objective measurements identify impairments when they are compared to normal values, the uninvolved extremity, and prior level of function.  [x]  Yes  []  No  Objective assessment of functional ability: Moderate functional limitations   Briefly describe symptoms:  patient is dependent in prosthetic care.  Patient has recurrent falls and Berg Balance 20/56 indicates high fall risk.  Patient has dependent prosthetic gait with deviations indicating high fall risk.    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  He is dependent in use especially donning suction ring suspension correctly.   Average pain intensity:  Last 24 hours: 7/10  Past week: 7/10  How often does the pt experience symptoms? Frequently  How much have the symptoms interfered with usual daily activities? Quite a bit  How has condition changed since care began at this facility? NA - initial visit  In general, how is the patients overall health? Good   BACK PAIN (STarT Back Screening Tool) No

## 2024-03-16 ENCOUNTER — Encounter: Payer: Self-pay | Admitting: Physical Therapy

## 2024-03-16 ENCOUNTER — Ambulatory Visit (INDEPENDENT_AMBULATORY_CARE_PROVIDER_SITE_OTHER): Admitting: Physical Therapy

## 2024-03-16 DIAGNOSIS — M6281 Muscle weakness (generalized): Secondary | ICD-10-CM

## 2024-03-16 DIAGNOSIS — R2681 Unsteadiness on feet: Secondary | ICD-10-CM

## 2024-03-16 DIAGNOSIS — R296 Repeated falls: Secondary | ICD-10-CM | POA: Diagnosis not present

## 2024-03-16 DIAGNOSIS — R2689 Other abnormalities of gait and mobility: Secondary | ICD-10-CM

## 2024-03-16 DIAGNOSIS — M25651 Stiffness of right hip, not elsewhere classified: Secondary | ICD-10-CM

## 2024-03-16 NOTE — Therapy (Signed)
 OUTPATIENT PHYSICAL THERAPY PROSTHETIC TREATMENT   Patient Name: Mark [name]. MRN: [id number] DOB:[birth year], 68 y.o., male Today's Date: 03/16/2024  END OF SESSION:  PT End of Session - 03/16/24 1445     Visit Number 4    Number of Visits 25    Date for Recertification  05/26/24    Authorization Type [employer] state Health and [facility] Medicare    Progress Note Due on Visit 10    PT Start Time 1440    PT Stop Time 1518    PT Time Calculation (min) 38 min    Activity Tolerance Patient tolerated treatment well    Behavior During Therapy WFL for tasks assessed/performed            Past Medical History:  Diagnosis Date   Alcohol  abuse    Anemia    Arthritis    Ascites    Cirrhosis ([name])    Coffee ground emesis    Dehydration 06/17/2017   Febrile illness    GERD (gastroesophageal reflux disease)    Heart murmur    History of blood transfusion    Hyperlipidemia    Hypertension    Leg swelling    Myocardial infarction ([name]) 2012   Preop cardiovascular exam 04/14/2013   Sepsis ([name]) 06/17/2017   Septic shock ([name]) 06/18/2017   SIRS (systemic inflammatory response syndrome) ([name]) 07/11/2017   Stroke ([name]) 10/2009   TIA   Thrombocytopenia    Past Surgical History:  Procedure Laterality Date   AMPUTATION Right 11/07/2022   Procedure: RIGHT ABOVE KNEE AMPUTATION;  Surgeon: Harden Jerona GAILS, MD;  [facility];  Service: Orthopedics;  Laterality: Right;   COLONOSCOPY WITH PROPOFOL  N/A 02/07/2016   Procedure: COLONOSCOPY WITH PROPOFOL ;  Surgeon: Toribio SHAUNNA Cedar, MD;  Location: [facility];  Service: Endoscopy;  Laterality: N/A;   ERCP N/A 02/28/2023   Procedure: ENDOSCOPIC RETROGRADE CHOLANGIOPANCREATOGRAPHY (ERCP);  Surgeon: Rollin Dover, MD;  Location: [facility];  Service: Gastroenterology;  Laterality: N/A;   ESOPHAGOGASTRODUODENOSCOPY (EGD) WITH PROPOFOL  N/A 02/07/2016   Procedure: ESOPHAGOGASTRODUODENOSCOPY (EGD) WITH PROPOFOL ;  Surgeon: Toribio SHAUNNA Cedar, MD;   Location: [facility];  Service: Endoscopy;  Laterality: N/A;   ESOPHAGOGASTRODUODENOSCOPY (EGD) WITH PROPOFOL  N/A 06/22/2017   Procedure: ESOPHAGOGASTRODUODENOSCOPY (EGD) WITH PROPOFOL ;  Surgeon: Albertus Gordy HERO, MD;  Location: [facility] ENDOSCOPY;  Service: Gastroenterology;  Laterality: N/A;   EXCISIONAL TOTAL KNEE ARTHROPLASTY WITH ANTIBIOTIC SPACERS Right 03/20/2020   Procedure: Resection right total knee arthroplasty and placement of antibiotic spacer;  Surgeon: Ernie Cough, MD;  Location: [facility];  Service: Orthopedics;  Laterality: Right;  90 mins   HERNIA REPAIR Right    inguinal   INGUINAL HERNIA REPAIR Left 02/08/2021   Procedure: OPEN LEFT INGUINAL HERNIA REPAIR WITH MESH;  Surgeon: Vernetta Berg, MD;  Location: [facility] OR;  Service: General;  Laterality: Left;   IRRIGATION AND DEBRIDEMENT KNEE Right 11/13/2020   Procedure: IRRIGATION AND DEBRIDEMENT KNEE;  Surgeon: Ernie Cough, MD;  Location: [facility];  Service: Orthopedics;  Laterality: Right;   JOINT REPLACEMENT     KNEE ARTHROSCOPY     bilateral/  12/14   REIMPLANTATION OF TOTAL KNEE Right 08/02/2020   Procedure: REIMPLANTATION/REVISION OF TOTAL KNEE WITH REMOVAL OF ANTIBIOTIC SPACER;  Surgeon: Ernie Cough, MD;  Location: [facility];  Service: Orthopedics;  Laterality: Right;    REMOVAL OF STONES  02/28/2023   Procedure: REMOVAL OF DEBRIS;  Surgeon: Rollin Dover, MD;  Location: [facility];  Service: Gastroenterology;;   ANNETT  02/28/2023   Procedure: ANNETT;  Surgeon: Rollin Dover, MD;  Location: [facility];  Service: Gastroenterology;;   TEE WITHOUT CARDIOVERSION N/A 05/13/2018   Procedure: TRANSESOPHAGEAL ECHOCARDIOGRAM (TEE);  Surgeon: Loni Soyla LABOR, MD;  Location: [facility] END[facility]COPY;  Service: Cardiovascular;  Laterality: N/A;   TOTAL KNEE ARTHROPLASTY Right 05/02/2013   Procedure: RIGHT TOTAL KNEE ARTHROPLASTY;  Surgeon: Donnice JONETTA Car, MD;  Location: [facility];  Service: Orthopedics;  Laterality: Right;    Patient Active Problem List   Diagnosis Date Noted   Acute blood loss anemia 03/02/2023   Choledocholithiasis 02/28/2023   HTN (hypertension) 02/28/2023   Hypokalemia 02/28/2023   Cholelithiasis with biliary obstruction 02/27/2023   Infection of total knee replacement 11/07/2022   Hx of AKA (above knee amputation), right ([name]) 11/07/2022   Cerebrovascular disease 07/08/2022   Renal cyst 07/08/2022   Protein-calorie malnutrition, severe 07/08/2022   Toxic metabolic encephalopathy 02/26/2022   Septic joint of right knee joint ([name]) 11/08/2020   S/P right TKA reimplantation 08/02/2020   Infection of total left knee replacement 03/20/2020   Cirrhosis of liver without ascites ([name])    AKI (acute kidney injury)    Bacteremia    Spinal stenosis of lumbar region without neurogenic claudication    Aspiration pneumonia ([name]) 09/24/2018   Abscess in epidural space of lumbar spine    MRSA bacteremia 09/21/2018   Infection of prosthetic right knee joint 09/20/2018   Anemia of chronic disease 09/20/2018   Hypoalbuminemia 09/20/2018   Hypoglycemia without diagnosis of diabetes mellitus 09/20/2018   Epidural abscess 09/20/2018   Hyperkalemia 05/18/2018   Edentulous 05/11/2018   Pancytopenia ([name]) 05/10/2018   CAD (coronary artery disease) 05/10/2018   Hepatic encephalopathy ([name]) 05/10/2018   Alcohol  abuse 05/10/2018   GERD (gastroesophageal reflux disease) 05/10/2018   Duodenal ulcer    Renal failure    Acute on chronic anemia    Hypotension 06/17/2017   Hyponatremia 06/17/2017   Leg edema, right 06/17/2017   Acute metabolic encephalopathy 06/17/2017   Anemia, iron  deficiency    Benign neoplasm of ascending colon    Hemorrhoids    Portal hypertensive gastropathy ([name])    Gastritis and gastroduodenitis    Esophageal varices without bleeding ([name])    H/O: CVA (cerebrovascular accident) 12/01/2013   ACS (acute coronary syndrome) ([name]) 12/01/2013   Anasarca 12/01/2013    Polysubstance abuse ([name]) 12/01/2013   CKD (chronic kidney disease) stage 3, GFR 30-59 ml/min ([name]) 12/01/2013   S/P right TKA 05/02/2013   Murmur 04/14/2013   Right inguinal hernia 12/26/2010   Cirrhosis, alcoholic ([name]) 12/20/2010    PCP: Mercer Clotilda SAUNDERS, MD  REFERRING PROVIDER: Harden Jerona GAILS, MD  ONSET DATE: 02/08/2024 MD referral to PT  REFERRING DIAG: Z89.611 (ICD-10-CM) - Hx of AKA (above knee amputation), right   THERAPY DIAG:  Other abnormalities of gait and mobility  Repeated falls  Unsteadiness on feet  Muscle weakness (generalized)  Stiffness of right hip, not elsewhere classified  Rationale for Evaluation and Treatment: Rehabilitation  SUBJECTIVE:   SUBJECTIVE STATEMENT: His prosthesis was too tight this morning and he had to take it off.  He tried to do his exercises.    PERTINENT HISTORY: right TFA 11/07/22, inguinal hernia repair BLEs, ETOH abuse, cirrhosis, arthritis, GERD, HTN, HLD, heart murmur, MI, TIA 2011, systemic inflammatory response syndrome   Patient underwent a right Transfemoral Amputation on 11/07/2022 due to infected TKA. Initial right TKA 2015 with antibiotic spacers 2021 & 2022 with I&D. He got prosthesis  on 02/10/2023. He had got new socket in April 2025.  The prosthesis slides off some times.  He is wearing prosthesis daily most of awake hours except if he is staying home.     PAIN:  NPRS scale: not rated this date  Pain location: right residual limb Pain description: soreness Aggravating factors: unknown Relieving factors: it goes away on its own  PRECAUTIONS: Fall  WEIGHT BEARING RESTRICTIONS: No  FALLS:  Has patient fallen in last 6 months? Yes. Number of falls >4 x/month. He denies injuries.  If prosthesis twist and catches prosthesis going up steps.   LIVING ENVIRONMENT: Lives with: lives with their spouse and dog 60#  Lives in: House Home Access: Stairs to enter Home layout: One level Stairs: Yes: External: 3 steps to  porch without rails and threshold into house Has following equipment at home: Single point cane, Environmental Consultant - 2 wheeled, Chief Operating Officer, Wheelchair (manual), and Tour manager  OCCUPATION:  retired does odd jobs lobbyist, estate manager/land agent, cut wood   PLOF: Independent  PATIENT GOALS:   He wants to walk   Next MD visit:  OBJECTIVE:   PATIENT SURVEYS:  Patient-Specific Activity Scoring Scheme  0 represents "unable to perform." 10 represents "able to perform at prior level. 0 1 2 3 4 5 6 7 8 9  10 (Date and Score)  Activity Eval     1.   Standing ADLs   8    2.  Walking with prosthesis  4    3.  Repair cars, yard work, chop wood 6   4.    5.    Score 6    Total score = sum of the activity scores/number of activities Minimum detectable change (90%CI) for average score = 2 points Minimum detectable change (90%CI) for single activity score = 3 points  COGNITION: Overall cognitive status: WFL    SENSATION: WFL  MUSCLE LENGTH: Evaluation on 02/29/2024: Debby test: Right -25 deg;  POSTURE:  Evaluation on 02/29/2024: rounded shoulders, forward head, flexed trunk , and weight shift left  LOWER EXTREMITY ROM:   ROM Left eval  Hip flexion   Hip extension -12*  Hip abduction   Hip adduction   Hip internal rotation   Hip external rotation   Knee flexion   Knee extension   Ankle dorsiflexion   Ankle plantarflexion   Ankle inversion   Ankle eversion    (Blank rows = not tested)  LOWER EXTREMITY MMT:  MMT Left eval  Hip flexion   Hip extension 4/5  Hip abduction 4/5  Hip adduction   Hip internal rotation   Hip external rotation   Knee flexion   Knee extension   Ankle dorsiflexion   Ankle plantarflexion   Ankle inversion   Ankle eversion    (Blank rows = not tested)  TRANSFERS: Evaluation on 02/29/2024: Sit to stand: SBA from 18 chair with armrest uses back of legs against chair to stabilize and requires external support of crutches to position lower  extremities for stabilization. Stand to sit: SBA requires use of armrest to control descent to 18 chair  FUNCTIONAL TESTs:  Evaluation on 02/29/2024: Berg Balance 20/56  [facility] PT Assessment - 02/29/24 1300       Standardized Balance Assessment   Standardized Balance Assessment Berg Balance Test      Berg Balance Test   Sit to Stand Needs minimal aid to stand or to stabilize    Standing Unsupported Able to stand 2 minutes with supervision  Sitting with Back Unsupported but Feet Supported on Floor or Stool Able to sit safely and securely 2 minutes    Stand to Sit Controls descent by using hands    Transfers Able to transfer safely, definite need of hands    Standing Unsupported with Eyes Closed Unable to keep eyes closed 3 seconds but stays steady    Standing Unsupported with Feet Together Needs help to attain position but able to stand for 30 seconds with feet together    From Standing, Reach Forward with Outstretched Arm Reaches forward but needs supervision    From Standing Position, Pick up Object from Floor Unable to pick up and needs supervision    From Standing Position, Turn to Look Behind Over each Shoulder Needs supervision when turning    Turn 360 Degrees Needs assistance while turning    Standing Unsupported, Alternately Place Feet on Step/Stool Needs assistance to keep from falling or unable to try    Standing Unsupported, One Foot in [employer] balance while stepping or standing    Standing on One Leg Tries to lift leg/unable to hold 3 seconds but remains standing independently    Total Score 20    Berg comment: BERG  < 36 high risk for falls (close to 100%) 46-51 moderate (>50%)   37-45 significant (>80%) 52-55 lower (> 25%)         GAIT: Evaluation on 02/29/2024: Gait pattern: step to pattern, decreased step length- Left, decreased stance time- Right, decreased hip/knee flexion- Right, circumduction- Right, Right hip hike, antalgic, trunk flexed, and abducted- Right  patient is fearful to unlock his prosthesis for swing phase. Pistoning noted with prosthesis due to suspension issue. Distance walked: 120' Assistive device utilized: Crutches and transfemoral prosthesis Level of assistance: SBA only when using crutches.  He would require greater assistance if he was using a cane or no device.  Gait velocity: 1.37 ft/sec Comments: Excessive weightbearing on axillary crutches  CURRENT PROSTHETIC WEAR ASSESSMENT: Evaluation on 02/29/2024: Patient is independent with: skin check Patient is dependent with: prosthetic cleaning, correct ply sock adjustment, and proper wear schedule/adjustment Donning prosthesis: SBA patient needs cues for new suspension system with suction ring Doffing prosthesis: Modified independence Prosthetic wear tolerance: Patient reports wearing prosthesis most of awake hours on days that he is leaving his home.  On days that he stays home he reports not wearing the prosthesis at all. Prosthetic weight bearing tolerance: 10 minutes with no increase in residual limb pain reported Edema: None Residual limb condition: No open areas, cylindrical shape, normal temperature and moisture level.  Prosthetic description: Silicone liner with suction ring suspension, ischial containment socket with flexible inner socket.  Multiaxial knee.    TODAY'S TREATMENT:                                                                                                                             DATE:  03/16/2024: Prosthetic Care with transfemoral prosthesis: PT reviewed  how tight hip flexors can cause prosthetic knee flexion earlier than he wants in gait cycle.  Some muscle soreness from new activities or use of muscle is normal.  Best thing is to continue with exercises.  Pt verbalized understanding.  Pt sit to/from stand chair with armrests to forearm crutches with verbal cues / SBA.  Pt amb 40' and 120' with forearm crutches with 3-point pattern with PT tactile &  verbal cues on sequence, step length, initial contact with heel / extending prosthetic knee, weight shift over prosthesis and trying to stand upright as much as possible.  Pt neg 11 steps with single rail & 1 forearm crutch with PT cues on technique and prosthetic knee ext for stance.  Switched side of rail half way so he understands how to negotiate with either rail available.    Manual Therapy:  Contract-relax and PT giving pressure to increase stretch.  Hip flexor stretch to facilitate upright posture.  Hooklying with RLE with prosthesis over edge 30 sec hold 3 reps.    Therapeutic Activities: Posterior pelvic tilt 5 sec hold 10 reps 2 sets with tactile & verbal cues.  Facilitating pelvic control for standing & gait activities.  Side lying RLE leg press manually (pelvic depression and hip extension) 10 reps.     TREATMENT:                                                                                                                             DATE:  03/14/2024: Prosthetic Care with transfemoral prosthesis: PT verbal & demo cues on step length with heel passing contralateral toes, weight shift over prosthesis in stance and upright posture.  Pt amb 75' X 2 with axillary crutches with supervision.  Pt amb 75' with forearm crutches with supervision.  PT cues on carryover of above.  Pt to try to get forearm crutches for Christmas present from family.    Therapeutic Activities:   Standing with back to counter working on upright posture with ears over shoulders over hips over feet.   Back extension standing with pelvis ~ step in front of counter so he can catch himself if falling back and chair in front facing him.  Under control lean forward placing hands in chair seat then stand up straight for 5 sec.  10 reps with supervision.  Hip flexor stretch to facilitate upright posture.  Hooklying with RLE with prosthesis over edge 30 sec hold 3 reps.  PT giving light pressure to increase stretch. PT  gave handout with verbal cues on above 3 exercises as HEP.     TREATMENT:  DATE:  03/04/2024: Prosthetic Care with transfemoral prosthesis: Patient independently doffed prosthesis and silicone liner in order for PT to assess skin as well as assess doffing and donning process. No skin lesions or breakdown noted during skin check that would support ill-fitting prosthesis. Patient verbalizing each step and appropriately donned transfemoral prosthesis using method that PT taught him at initial evaluation. Patient then performed a sit<>stand from mat table and described pulling sensation again. PT educated patient on hip flexion contracture that would explain the pulling sensation in the front of the thigh. However, patient was informed that if the discomfort continues after given hip flexor stretch (to be given at next session) and working on goals, patient would need to reach out to prosthetist for a socket fit check. Patient and spouse acknowledge and agree to plan.  Patient endorsed an episode of falling due to moment of forgetting crutches when chasing after the dog after lying back down while he didn't have his prosthesis on. PT educated patient on importance of putting prosthesis on as soon as he wakes up as there will be moments like these that occur where he will need quick movements and to be as safe as possible, prosthesis will at least keep him from falling onto residual limb. Patient acknowledged and agreed.    Neuro Re-Ed:  PT demonstrated below exercises and then had patient perform 1x10 of each activity while holding on to sink with CGA and verbal cues throughout. Even with verbal and tactile cues, patient returned to downward gaze and increased hip flexion after a few reps of each activity. One instance of slight Rt knee instability, but patient able to self correct with UE  use on sink and no required increase in physical assist. Printout of below activities provided for patient and spouse. Do each exercise 1-2  times per day Do each exercise 5-10 repetitions Hold each exercise for 2 seconds to feel your location  AT SINK FIND YOUR MIDLINE POSITION AND PLACE FEET EQUAL DISTANCE FROM THE MIDLINE.  Try to find this position when standing still for activities.   USE TAPE ON FLOOR TO MARK THE MIDLINE POSITION which is even with middle of sink.  You also should try to feel with your limb pressure in socket.  You are trying to feel with limb what you used to feel with the bottom of your foot.  Side to Side Shift: Moving your hips only (not shoulders): move weight onto your left leg, HOLD/FEEL pressure in socket.  Move back to equal weight on each leg, HOLD/FEEL pressure in socket. Move weight onto your right leg, HOLD/FEEL pressure in socket. Move back to equal weight on each leg, HOLD/FEEL pressure in socket. Repeat.  Start with both hands on sink, progress to hand on prosthetic side only, then no hands.  Front to Back Shift: Moving your hips only (not shoulders): move your weight forward onto your toes, HOLD/FEEL pressure in socket. Move your weight back to equal Flat Foot on both legs, HOLD/FEEL  pressure in socket. Move your weight back onto your heels, HOLD/FEEL  pressure in socket. Move your weight back to equal on both legs, HOLD/FEEL  pressure in socket. Repeat.  Start with both hands on sink, progress to hand on prosthetic side only, then no hands.  Moving Cones / Cups: With equal weight on each leg: Hold on with one hand the first time, then progress to no hand supports. Move cups from one side of sink to the other. Place cups ~2"  out of your reach, progress to 10" beyond reach.  Place one hand in middle of sink and reach with other hand. Do both arms.  Then hover one hand and move cups with other hand.  Overhead/Upward Reaching: alternated reaching up to top cabinets or  ceiling if no cabinets present. Keep equal weight on each leg. Start with one hand support on counter while other hand reaches and progress to no hand support with reaching.  ace one hand in middle of sink and reach with other hand. Do both arms.  Then hover one hand and move cups with other hand.  5.    Looking Over Shoulders: With equal weight on each leg: alternate turning to look over your shoulders with one hand support on counter as needed.  Start with head motions only to look in front of shoulder, then even with shoulder and progress to looking behind you. To look to side, move head /eyes, then shoulder on side looking pulls back, shift more weight to side looking and pull hip back. Place one hand in middle of sink and let go with other hand so your shoulder can pull back. Switch hands to look other way.   Then hover one hand and look over shoulder. If looking right, use left hand at sink. If looking left, use right hand at sink. 6.  Stepping with leg that is not amputated:  Move items under cabinet out of your way. Shift your hips/pelvis so weight on prosthesis. Tighten muscles in hip on prosthetic side.  SLOWLY step other leg so front of foot is in cabinet. Then step back to floor.   TREATMENT:                                                                                                                             DATE:  02/29/2024: Prosthetic Care with transfemoral prosthesis: See patient education below.  PATIENT EDUCATION: PATIENT EDUCATED ON FOLLOWING PROSTHETIC CARE: Education details: Sitting positioning,  sit to stand technique, Prosthetic cleaning, proper donning with suction ring suspension, and Proper wear schedule/adjustment Prosthetic wear tolerance: Recommending to wear daily most of awake hours whether he is staying home or leaving his home.  Person educated: Patient and Spouse Education method: Explanation, Demonstration, Tactile cues, and Verbal cues Education comprehension:  verbalized understanding, verbal cues required, tactile cues required, and needs further education  HOME EXERCISE PROGRAM: Access Code: [email or web address] URL: [email or web address] Date: 03/14/2024 Prepared by: Grayce Spatz  Exercises - Standing posture with back to counter  - 1-3 x daily - 7 x weekly - 1 sets - 1 reps - 2-5 minutes hold - standing back extension  - 1-3 x daily - 7 x weekly - 1 sets - 10 reps - 5 seconds hold - Modified Thomas Stretch  - 1-3 x daily - 7 x weekly - 1 sets - 3 reps - 30 seconds hold   ASSESSMENT:  CLINICAL IMPRESSION: Patient improved prosthetic gait with forearm crutches  on level surfaces today.  He reports that he feels better with forearm crutches over axillary crutches.  PT worked on increasing hip ext (decreasing flexion contracture) which he responded well to but is reporting no carryover with exercises at home.    Patient will continue to benefit from skilled PT.  OBJECTIVE IMPAIRMENTS: Abnormal gait, decreased activity tolerance, decreased balance, decreased endurance, decreased knowledge of condition, decreased knowledge of use of DME, decreased mobility, difficulty walking, decreased ROM, decreased strength, postural dysfunction, prosthetic dependency , and pain.   ACTIVITY LIMITATIONS: carrying, lifting, sitting, standing, stairs, transfers, and locomotion level  PARTICIPATION LIMITATIONS: meal prep, cleaning, and community activity  PERSONAL FACTORS: Age, Past/current experiences, Time since onset of injury/illness/exacerbation, and 3+ comorbidities: see PMH are also affecting patient's functional outcome.   REHAB POTENTIAL: Good  CLINICAL DECISION MAKING: Evolving/moderate complexity  EVALUATION COMPLEXITY: Moderate   GOALS: Goals reviewed with patient? Yes  SHORT TERM GOALS: Target date: 03/30/2024  Patient donnes prosthesis modified independent correctly Baseline: SEE OBJECTIVE DATA Goal status: Ongoing  03/14/2024 2.   Patient tolerates prosthesis wear daily for >12 hours without skin issues or limb pain over 5/10 Baseline: SEE OBJECTIVE DATA Goal status: Ongoing  03/14/2024  3.  Patient able to reach 7 and look over both shoulders without UE support with supervision. Baseline: SEE OBJECTIVE DATA Goal status: Ongoing  03/14/2024  4. Patient ambulates 200' with cruthes & prosthesis with supervision with prosthetic knee flexion >50% of steps for swing phase. Baseline: SEE OBJECTIVE DATA Goal status:   Ongoing  03/14/2024  5. Patient negotiates ramps & curbs with crutches & prosthesis with supervision. Baseline: SEE OBJECTIVE DATA Goal status: Ongoing  03/14/2024  LONG TERM GOALS: Target date: 05/27/2023  Patient demonstrates & verbalized understanding of prosthetic care to enable safe utilization of prosthesis. Baseline: SEE OBJECTIVE DATA Goal status: Ongoing  03/14/2024  Patient tolerates prosthesis wear >90% of awake hours without skin or limb pain >2/10 issues. Baseline: SEE OBJECTIVE DATA Goal status: Ongoing  03/14/2024  Berg Balance >36/56 to indicate lower fall risk Baseline: SEE OBJECTIVE DATA Goal status: Ongoing  03/14/2024  Patient ambulates >250' with prosthesis with cane or less independently Baseline: SEE OBJECTIVE DATA Goal status: Ongoing  03/14/2024  Patient negotiates ramps, curbs & stairs with single rail with prosthesis  and cane or less independently. Baseline: SEE OBJECTIVE DATA Goal status: Ongoing  03/14/2024  8.  Patient reports Patient-Specific Activity Score improved to average 8 to indicate improvement in functional activities.   Baseline: SEE OBJECTIVE DATA Goal status: Ongoing  03/14/2024  PLAN:  PT FREQUENCY: 2x/week  PT DURATION: 12 weeks  PLANNED INTERVENTIONS: 97164- PT Re-evaluation, 97750- Physical Performance Testing, 97110-Therapeutic exercises, 97530- Therapeutic activity, 97112- Neuromuscular re-education, [phone]- Self Care, 02883- Gait training,  [phone]- Prosthetic Initial , [phone]- Orthotic/Prosthetic subsequent, Patient/Family education, Balance training, and Stair training  PLAN FOR NEXT SESSION:  introduce ramp & curb with forearm crutch.   prosthetic gait with forearm crutches working on terminal stance & swing prosthetic knee flexion and weight shift over prosthesis in stance, postural strengthening/flexibility, check hip flexor stretch for HEP     Grayce Spatz, PT, DPT 03/16/2024, 3:36 PM   Date of referral: 02/08/2024 Referring provider: Jerona Sage Referring diagnosis? Z89.611 (ICD-10-CM) - Hx of AKA (above knee amputation), right  Treatment diagnosis? (if different than referring diagnosis) R26.89, R29.6, R26.81, M62.81, M25.651  What was this (referring dx) caused by? Surgery (Type: right AKA) and [place] Hawks of Condition: Recurrent (multiple episodes of <  3 months)   Laterality: Rt  Current Functional Measure Score:  Berg Balance 20/56  Objective measurements identify impairments when they are compared to normal values, the uninvolved extremity, and prior level of function.  [x]  Yes  []  No  Objective assessment of functional ability: Moderate functional limitations   Briefly describe symptoms:  patient is dependent in prosthetic care.  Patient has recurrent falls and Berg Balance 20/56 indicates high fall risk.  Patient has dependent prosthetic gait with deviations indicating high fall risk.    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  He is dependent in use especially donning suction ring suspension correctly.   Average pain intensity:  Last 24 hours: 7/10  Past week: 7/10  How often does the pt experience symptoms? Frequently  How much have the symptoms interfered with usual daily activities? Quite a bit  How has condition changed since care began at this facility? NA - initial visit  In general, how is the patients overall health?  Good   BACK PAIN (STarT Back Screening Tool) No

## 2024-03-22 ENCOUNTER — Encounter: Admitting: Physical Therapy

## 2024-03-22 NOTE — Therapy (Incomplete)
 OUTPATIENT PHYSICAL THERAPY PROSTHETIC TREATMENT   Patient Name: Mark [name]. MRN: [id number] DOB:11-03-1955, 68 y.o., male Today's Date: 03/22/2024  END OF SESSION:      Past Medical History:  Diagnosis Date   Alcohol  abuse    Anemia    Arthritis    Ascites    Cirrhosis ([name])    Coffee ground emesis    Dehydration 06/17/2017   Febrile illness    GERD (gastroesophageal reflux disease)    Heart murmur    History of blood transfusion    Hyperlipidemia    Hypertension    Leg swelling    Myocardial infarction ([name]) 2012   Preop cardiovascular exam 04/14/2013   Sepsis ([name]) 06/17/2017   Septic shock ([name]) 06/18/2017   SIRS (systemic inflammatory response syndrome) ([name]) 07/11/2017   Stroke ([name]) 10/2009   TIA   Thrombocytopenia    Past Surgical History:  Procedure Laterality Date   AMPUTATION Right 11/07/2022   Procedure: RIGHT ABOVE KNEE AMPUTATION;  Surgeon: Harden Jerona GAILS, MD;  Location: [facility] OR;  Service: Orthopedics;  Laterality: Right;   COLONOSCOPY WITH PROPOFOL  N/A 02/07/2016   Procedure: COLONOSCOPY WITH PROPOFOL ;  Surgeon: Toribio SHAUNNA Cedar, MD;  Location: [facility];  Service: Endoscopy;  Laterality: N/A;   ERCP N/A 02/28/2023   Procedure: ENDOSCOPIC RETROGRADE CHOLANGIOPANCREATOGRAPHY (ERCP);  Surgeon: Rollin Dover, MD;  Location: [facility];  Service: Gastroenterology;  Laterality: N/A;   ESOPHAGOGASTRODUODENOSCOPY (EGD) WITH PROPOFOL  N/A 02/07/2016   Procedure: ESOPHAGOGASTRODUODENOSCOPY (EGD) WITH PROPOFOL ;  Surgeon: Toribio SHAUNNA Cedar, MD;  Location: [facility];  Service: Endoscopy;  Laterality: N/A;   ESOPHAGOGASTRODUODENOSCOPY (EGD) WITH PROPOFOL  N/A 06/22/2017   Procedure: ESOPHAGOGASTRODUODENOSCOPY (EGD) WITH PROPOFOL ;  Surgeon: Albertus Gordy HERO, MD;  Location: [facility] ENDOSCOPY;  Service: Gastroenterology;  Laterality: N/A;   EXCISIONAL TOTAL KNEE ARTHROPLASTY WITH ANTIBIOTIC SPACERS Right 03/20/2020   Procedure: Resection right total knee  arthroplasty and placement of antibiotic spacer;  Surgeon: Ernie Cough, MD;  Location: [facility] ORS;  Service: Orthopedics;  Laterality: Right;  90 mins   HERNIA REPAIR Right    inguinal   INGUINAL HERNIA REPAIR Left 02/08/2021   Procedure: OPEN LEFT INGUINAL HERNIA REPAIR WITH MESH;  Surgeon: Vernetta Berg, MD;  Location: [facility] OR;  Service: General;  Laterality: Left;   IRRIGATION AND DEBRIDEMENT KNEE Right 11/13/2020   Procedure: IRRIGATION AND DEBRIDEMENT KNEE;  Surgeon: Ernie Cough, MD;  Location: [facility];  Service: Orthopedics;  Laterality: Right;   JOINT REPLACEMENT     KNEE ARTHROSCOPY     bilateral/  12/14   REIMPLANTATION OF TOTAL KNEE Right 08/02/2020   Procedure: REIMPLANTATION/REVISION OF TOTAL KNEE WITH REMOVAL OF ANTIBIOTIC SPACER;  Surgeon: Ernie Cough, MD;  Location: [facility];  Service: Orthopedics;  Laterality: Right;    REMOVAL OF STONES  02/28/2023   Procedure: REMOVAL OF DEBRIS;  Surgeon: Rollin Dover, MD;  Location: [facility];  Service: Gastroenterology;;   ANNETT  02/28/2023   Procedure: ANNETT;  Surgeon: Rollin Dover, MD;  Location: [facility];  Service: Gastroenterology;;   TEE WITHOUT CARDIOVERSION N/A 05/13/2018   Procedure: TRANSESOPHAGEAL ECHOCARDIOGRAM (TEE);  Surgeon: Loni Soyla LABOR, MD;  Location: [facility] ENDOSCOPY;  Service: Cardiovascular;  Laterality: N/A;   TOTAL KNEE ARTHROPLASTY Right 05/02/2013   Procedure: RIGHT TOTAL KNEE ARTHROPLASTY;  Surgeon: Cough JONETTA Ernie, MD;  Location: [facility];  Service: Orthopedics;  Laterality: Right;   Patient Active Problem List   Diagnosis Date Noted   Acute blood loss anemia 03/02/2023   Choledocholithiasis  02/28/2023   HTN (hypertension) 02/28/2023   Hypokalemia 02/28/2023   Cholelithiasis with biliary obstruction 02/27/2023   Infection of total knee replacement 11/07/2022   Hx of AKA (above knee amputation), right ([name]) 11/07/2022   Cerebrovascular disease 07/08/2022   Renal cyst 07/08/2022    Protein-calorie malnutrition, severe 07/08/2022   Toxic metabolic encephalopathy 02/26/2022   Septic joint of right knee joint ([name]) 11/08/2020   S/P right TKA reimplantation 08/02/2020   Infection of total left knee replacement 03/20/2020   Cirrhosis of liver without ascites ([name])    AKI (acute kidney injury)    Bacteremia    Spinal stenosis of lumbar region without neurogenic claudication    Aspiration pneumonia ([name]) 09/24/2018   Abscess in epidural space of lumbar spine    MRSA bacteremia 09/21/2018   Infection of prosthetic right knee joint 09/20/2018   Anemia of chronic disease 09/20/2018   Hypoalbuminemia 09/20/2018   Hypoglycemia without diagnosis of diabetes mellitus 09/20/2018   Epidural abscess 09/20/2018   Hyperkalemia 05/18/2018   Edentulous 05/11/2018   Pancytopenia ([name]) 05/10/2018   CAD (coronary artery disease) 05/10/2018   Hepatic encephalopathy ([name]) 05/10/2018   Alcohol  abuse 05/10/2018   GERD (gastroesophageal reflux disease) 05/10/2018   Duodenal ulcer    Renal failure    Acute on chronic anemia    Hypotension 06/17/2017   Hyponatremia 06/17/2017   Leg edema, right 06/17/2017   Acute metabolic encephalopathy 06/17/2017   Anemia, iron  deficiency    Benign neoplasm of ascending colon    Hemorrhoids    Portal hypertensive gastropathy ([name])    Gastritis and gastroduodenitis    Esophageal varices without bleeding ([name])    H/O: CVA (cerebrovascular accident) 12/01/2013   ACS (acute coronary syndrome) ([name]) 12/01/2013   Anasarca 12/01/2013   Polysubstance abuse ([name]) 12/01/2013   CKD (chronic kidney disease) stage 3, GFR 30-59 ml/min ([name]) 12/01/2013   S/P right TKA 05/02/2013   Murmur 04/14/2013   Right inguinal hernia 12/26/2010   Cirrhosis, alcoholic ([name]) 12/20/2010    PCP: Mercer Clotilda SAUNDERS, MD  REFERRING PROVIDER: Harden Jerona GAILS, MD  ONSET DATE: 02/08/2024 MD referral to PT  REFERRING DIAG: Z89.611 (ICD-10-CM) - Hx of AKA (above knee  amputation), right   THERAPY DIAG:  No diagnosis found.  Rationale for Evaluation and Treatment: Rehabilitation  SUBJECTIVE:   SUBJECTIVE STATEMENT: ***  His prosthesis was too tight this morning and he had to take it off.  He tried to do his exercises.    PERTINENT HISTORY: right TFA 11/07/22, inguinal hernia repair BLEs, ETOH abuse, cirrhosis, arthritis, GERD, HTN, HLD, heart murmur, MI, TIA 2011, systemic inflammatory response syndrome   Patient underwent a right Transfemoral Amputation on 11/07/2022 due to infected TKA. Initial right TKA 2015 with antibiotic spacers 2021 & 2022 with I&D. He got prosthesis on 02/10/2023. He had got new socket in April 2025.  The prosthesis slides off some times.  He is wearing prosthesis daily most of awake hours except if he is staying home.     PAIN:  NPRS scale: not rated this date  Pain location: right residual limb Pain description: soreness Aggravating factors: unknown Relieving factors: it goes away on its own  PRECAUTIONS: Fall  WEIGHT BEARING RESTRICTIONS: No  FALLS:  Has patient fallen in last 6 months? Yes. Number of falls >4 x/month. He denies injuries.  If prosthesis twist and catches prosthesis going up steps.   LIVING ENVIRONMENT: Lives with: lives with their spouse and dog 60#  Lives  in: House Home Access: Stairs to enter Home layout: One level Stairs: Yes: External: 3 steps to porch without rails and threshold into house Has following equipment at home: Single point cane, Environmental Consultant - 2 wheeled, Chief Operating Officer, Wheelchair (manual), and Tour manager  OCCUPATION:  retired does odd jobs lobbyist, estate manager/land agent, cut wood   PLOF: Independent  PATIENT GOALS:   He wants to walk   Next MD visit:  OBJECTIVE:   PATIENT SURVEYS:  Patient-Specific Activity Scoring Scheme  0 represents "unable to perform." 10 represents "able to perform at prior level. 0 1 2 3 4 5 6 7 8 9  10 (Date and Score)  Activity Eval     1.    Standing ADLs   8    2.  Walking with prosthesis  4    3.  Repair cars, yard work, chop wood 6   4.    5.    Score 6    Total score = sum of the activity scores/number of activities Minimum detectable change (90%CI) for average score = 2 points Minimum detectable change (90%CI) for single activity score = 3 points  COGNITION: Overall cognitive status: WFL    SENSATION: WFL  MUSCLE LENGTH: Evaluation on 02/29/2024: Debby test: Right -25 deg;  POSTURE:  Evaluation on 02/29/2024: rounded shoulders, forward head, flexed trunk , and weight shift left  LOWER EXTREMITY ROM:   ROM Left eval  Hip flexion   Hip extension -12*  Hip abduction   Hip adduction   Hip internal rotation   Hip external rotation   Knee flexion   Knee extension   Ankle dorsiflexion   Ankle plantarflexion   Ankle inversion   Ankle eversion    (Blank rows = not tested)  LOWER EXTREMITY MMT:  MMT Left eval  Hip flexion   Hip extension 4/5  Hip abduction 4/5  Hip adduction   Hip internal rotation   Hip external rotation   Knee flexion   Knee extension   Ankle dorsiflexion   Ankle plantarflexion   Ankle inversion   Ankle eversion    (Blank rows = not tested)  TRANSFERS: Evaluation on 02/29/2024: Sit to stand: SBA from 18 chair with armrest uses back of legs against chair to stabilize and requires external support of crutches to position lower extremities for stabilization. Stand to sit: SBA requires use of armrest to control descent to 18 chair  FUNCTIONAL TESTs:  Evaluation on 02/29/2024: Berg Balance 20/56  [facility] PT Assessment - 02/29/24 1300       Standardized Balance Assessment   Standardized Balance Assessment Berg Balance Test      Berg Balance Test   Sit to Stand Needs minimal aid to stand or to stabilize    Standing Unsupported Able to stand 2 minutes with supervision    Sitting with Back Unsupported but Feet Supported on Floor or Stool Able to sit safely and securely 2  minutes    Stand to Sit Controls descent by using hands    Transfers Able to transfer safely, definite need of hands    Standing Unsupported with Eyes Closed Unable to keep eyes closed 3 seconds but stays steady    Standing Unsupported with Feet Together Needs help to attain position but able to stand for 30 seconds with feet together    From Standing, Reach Forward with Outstretched Arm Reaches forward but needs supervision    From Standing Position, Pick up Object from Floor Unable to pick up and needs  supervision    From Standing Position, Turn to Look Behind Over each Shoulder Needs supervision when turning    Turn 360 Degrees Needs assistance while turning    Standing Unsupported, Alternately Place Feet on Step/Stool Needs assistance to keep from falling or unable to try    Standing Unsupported, One Foot in [employer] balance while stepping or standing    Standing on One Leg Tries to lift leg/unable to hold 3 seconds but remains standing independently    Total Score 20    Berg comment: BERG  < 36 high risk for falls (close to 100%) 46-51 moderate (>50%)   37-45 significant (>80%) 52-55 lower (> 25%)         GAIT: Evaluation on 02/29/2024: Gait pattern: step to pattern, decreased step length- Left, decreased stance time- Right, decreased hip/knee flexion- Right, circumduction- Right, Right hip hike, antalgic, trunk flexed, and abducted- Right patient is fearful to unlock his prosthesis for swing phase. Pistoning noted with prosthesis due to suspension issue. Distance walked: 120' Assistive device utilized: Crutches and transfemoral prosthesis Level of assistance: SBA only when using crutches.  He would require greater assistance if he was using a cane or no device.  Gait velocity: 1.37 ft/sec Comments: Excessive weightbearing on axillary crutches  CURRENT PROSTHETIC WEAR ASSESSMENT: Evaluation on 02/29/2024: Patient is independent with: skin check Patient is dependent with:  prosthetic cleaning, correct ply sock adjustment, and proper wear schedule/adjustment Donning prosthesis: SBA patient needs cues for new suspension system with suction ring Doffing prosthesis: Modified independence Prosthetic wear tolerance: Patient reports wearing prosthesis most of awake hours on days that he is leaving his home.  On days that he stays home he reports not wearing the prosthesis at all. Prosthetic weight bearing tolerance: 10 minutes with no increase in residual limb pain reported Edema: None Residual limb condition: No open areas, cylindrical shape, normal temperature and moisture level.  Prosthetic description: Silicone liner with suction ring suspension, ischial containment socket with flexible inner socket.  Multiaxial knee.    TODAY'S TREATMENT:                                                                                                                             DATE:  03/22/2024: Prosthetic Care with transfemoral prosthesis: ***  PT reviewed how tight hip flexors can cause prosthetic knee flexion earlier than he wants in gait cycle.  Some muscle soreness from new activities or use of muscle is normal.  Best thing is to continue with exercises.  Pt verbalized understanding.  Pt sit to/from stand chair with armrests to forearm crutches with verbal cues / SBA.  Pt amb 40' and 120' with forearm crutches with 3-point pattern with PT tactile & verbal cues on sequence, step length, initial contact with heel / extending prosthetic knee, weight shift over prosthesis and trying to stand upright as much as possible.  Pt neg 11 steps with single rail & 1 forearm crutch with  PT cues on technique and prosthetic knee ext for stance.  Switched side of rail half way so he understands how to negotiate with either rail available.    Manual Therapy:  Contract-relax and PT giving pressure to increase stretch.  Hip flexor stretch to facilitate upright posture.  Hooklying with RLE with  prosthesis over edge 30 sec hold 3 reps.    Therapeutic Activities: Posterior pelvic tilt 5 sec hold 10 reps 2 sets with tactile & verbal cues.  Facilitating pelvic control for standing & gait activities.  Side lying RLE leg press manually (pelvic depression and hip extension) 10 reps.      TREATMENT:                                                                                                                             DATE:  03/16/2024: Prosthetic Care with transfemoral prosthesis: PT reviewed how tight hip flexors can cause prosthetic knee flexion earlier than he wants in gait cycle.  Some muscle soreness from new activities or use of muscle is normal.  Best thing is to continue with exercises.  Pt verbalized understanding.  Pt sit to/from stand chair with armrests to forearm crutches with verbal cues / SBA.  Pt amb 40' and 120' with forearm crutches with 3-point pattern with PT tactile & verbal cues on sequence, step length, initial contact with heel / extending prosthetic knee, weight shift over prosthesis and trying to stand upright as much as possible.  Pt neg 11 steps with single rail & 1 forearm crutch with PT cues on technique and prosthetic knee ext for stance.  Switched side of rail half way so he understands how to negotiate with either rail available.    Manual Therapy:  Contract-relax and PT giving pressure to increase stretch.  Hip flexor stretch to facilitate upright posture.  Hooklying with RLE with prosthesis over edge 30 sec hold 3 reps.    Therapeutic Activities: Posterior pelvic tilt 5 sec hold 10 reps 2 sets with tactile & verbal cues.  Facilitating pelvic control for standing & gait activities.  Side lying RLE leg press manually (pelvic depression and hip extension) 10 reps.     TREATMENT:                                                                                                                             DATE:  03/14/2024: Prosthetic Care with transfemoral  prosthesis: PT verbal & demo cues on step length with heel passing contralateral toes, weight shift over prosthesis in stance and upright posture.  Pt amb 75' X 2 with axillary crutches with supervision.  Pt amb 75' with forearm crutches with supervision.  PT cues on carryover of above.  Pt to try to get forearm crutches for Christmas present from family.    Therapeutic Activities:   Standing with back to counter working on upright posture with ears over shoulders over hips over feet.   Back extension standing with pelvis ~ step in front of counter so he can catch himself if falling back and chair in front facing him.  Under control lean forward placing hands in chair seat then stand up straight for 5 sec.  10 reps with supervision.  Hip flexor stretch to facilitate upright posture.  Hooklying with RLE with prosthesis over edge 30 sec hold 3 reps.  PT giving light pressure to increase stretch. PT gave handout with verbal cues on above 3 exercises as HEP.     TREATMENT:                                                                                                                             DATE:  03/04/2024: Prosthetic Care with transfemoral prosthesis: Patient independently doffed prosthesis and silicone liner in order for PT to assess skin as well as assess doffing and donning process. No skin lesions or breakdown noted during skin check that would support ill-fitting prosthesis. Patient verbalizing each step and appropriately donned transfemoral prosthesis using method that PT taught him at initial evaluation. Patient then performed a sit<>stand from mat table and described pulling sensation again. PT educated patient on hip flexion contracture that would explain the pulling sensation in the front of the thigh. However, patient was informed that if the discomfort continues after given hip flexor stretch (to be given at next session) and working on goals, patient would need to reach out to  prosthetist for a socket fit check. Patient and spouse acknowledge and agree to plan.  Patient endorsed an episode of falling due to moment of forgetting crutches when chasing after the dog after lying back down while he didn't have his prosthesis on. PT educated patient on importance of putting prosthesis on as soon as he wakes up as there will be moments like these that occur where he will need quick movements and to be as safe as possible, prosthesis will at least keep him from falling onto residual limb. Patient acknowledged and agreed.    Neuro Re-Ed:  PT demonstrated below exercises and then had patient perform 1x10 of each activity while holding on to sink with CGA and verbal cues throughout. Even with verbal and tactile cues, patient returned to downward gaze and increased hip flexion after a few reps of each activity. One instance of slight Rt knee instability, but patient able to self correct with UE use on sink and no required  increase in physical assist. Printout of below activities provided for patient and spouse. Do each exercise 1-2  times per day Do each exercise 5-10 repetitions Hold each exercise for 2 seconds to feel your location  AT SINK FIND YOUR MIDLINE POSITION AND PLACE FEET EQUAL DISTANCE FROM THE MIDLINE.  Try to find this position when standing still for activities.   USE TAPE ON FLOOR TO MARK THE MIDLINE POSITION which is even with middle of sink.  You also should try to feel with your limb pressure in socket.  You are trying to feel with limb what you used to feel with the bottom of your foot.  Side to Side Shift: Moving your hips only (not shoulders): move weight onto your left leg, HOLD/FEEL pressure in socket.  Move back to equal weight on each leg, HOLD/FEEL pressure in socket. Move weight onto your right leg, HOLD/FEEL pressure in socket. Move back to equal weight on each leg, HOLD/FEEL pressure in socket. Repeat.  Start with both hands on sink, progress to hand on  prosthetic side only, then no hands.  Front to Back Shift: Moving your hips only (not shoulders): move your weight forward onto your toes, HOLD/FEEL pressure in socket. Move your weight back to equal Flat Foot on both legs, HOLD/FEEL  pressure in socket. Move your weight back onto your heels, HOLD/FEEL  pressure in socket. Move your weight back to equal on both legs, HOLD/FEEL  pressure in socket. Repeat.  Start with both hands on sink, progress to hand on prosthetic side only, then no hands.  Moving Cones / Cups: With equal weight on each leg: Hold on with one hand the first time, then progress to no hand supports. Move cups from one side of sink to the other. Place cups ~2" out of your reach, progress to 10" beyond reach.  Place one hand in middle of sink and reach with other hand. Do both arms.  Then hover one hand and move cups with other hand.  Overhead/Upward Reaching: alternated reaching up to top cabinets or ceiling if no cabinets present. Keep equal weight on each leg. Start with one hand support on counter while other hand reaches and progress to no hand support with reaching.  ace one hand in middle of sink and reach with other hand. Do both arms.  Then hover one hand and move cups with other hand.  5.    Looking Over Shoulders: With equal weight on each leg: alternate turning to look over your shoulders with one hand support on counter as needed.  Start with head motions only to look in front of shoulder, then even with shoulder and progress to looking behind you. To look to side, move head /eyes, then shoulder on side looking pulls back, shift more weight to side looking and pull hip back. Place one hand in middle of sink and let go with other hand so your shoulder can pull back. Switch hands to look other way.   Then hover one hand and look over shoulder. If looking right, use left hand at sink. If looking left, use right hand at sink. 6.  Stepping with leg that is not amputated:  Move items under  cabinet out of your way. Shift your hips/pelvis so weight on prosthesis. Tighten muscles in hip on prosthetic side.  SLOWLY step other leg so front of foot is in cabinet. Then step back to floor.      HOME EXERCISE PROGRAM: Access Code: [email or web address] URL: [email or web address] Date:  03/14/2024 Prepared by: Grayce Spatz  Exercises - Standing posture with back to counter  - 1-3 x daily - 7 x weekly - 1 sets - 1 reps - 2-5 minutes hold - standing back extension  - 1-3 x daily - 7 x weekly - 1 sets - 10 reps - 5 seconds hold - Modified Thomas Stretch  - 1-3 x daily - 7 x weekly - 1 sets - 3 reps - 30 seconds hold   ASSESSMENT:  CLINICAL IMPRESSION: ***  Patient improved prosthetic gait with forearm crutches on level surfaces today.  He reports that he feels better with forearm crutches over axillary crutches.  PT worked on increasing hip ext (decreasing flexion contracture) which he responded well to but is reporting no carryover with exercises at home.    Patient will continue to benefit from skilled PT.  OBJECTIVE IMPAIRMENTS: Abnormal gait, decreased activity tolerance, decreased balance, decreased endurance, decreased knowledge of condition, decreased knowledge of use of DME, decreased mobility, difficulty walking, decreased ROM, decreased strength, postural dysfunction, prosthetic dependency , and pain.   ACTIVITY LIMITATIONS: carrying, lifting, sitting, standing, stairs, transfers, and locomotion level  PARTICIPATION LIMITATIONS: meal prep, cleaning, and community activity  PERSONAL FACTORS: Age, Past/current experiences, Time since onset of injury/illness/exacerbation, and 3+ comorbidities: see PMH are also affecting patient's functional outcome.   REHAB POTENTIAL: Good  CLINICAL DECISION MAKING: Evolving/moderate complexity  EVALUATION COMPLEXITY: Moderate   GOALS: Goals reviewed with patient? Yes  SHORT TERM GOALS: Target date: 03/30/2024  Patient donnes  prosthesis modified independent correctly Baseline: SEE OBJECTIVE DATA Goal status: Ongoing   03/22/2024 2.  Patient tolerates prosthesis wear daily for >12 hours without skin issues or limb pain over 5/10 Baseline: SEE OBJECTIVE DATA Goal status: Ongoing   03/22/2024  3.  Patient able to reach 7 and look over both shoulders without UE support with supervision. Baseline: SEE OBJECTIVE DATA Goal status: Ongoing   03/22/2024  4. Patient ambulates 200' with cruthes & prosthesis with supervision with prosthetic knee flexion >50% of steps for swing phase. Baseline: SEE OBJECTIVE DATA Goal status:   Ongoing   03/22/2024  5. Patient negotiates ramps & curbs with crutches & prosthesis with supervision. Baseline: SEE OBJECTIVE DATA Goal status: Ongoing   03/22/2024  LONG TERM GOALS: Target date: 05/27/2023  Patient demonstrates & verbalized understanding of prosthetic care to enable safe utilization of prosthesis. Baseline: SEE OBJECTIVE DATA Goal status: Ongoing   03/22/2024  Patient tolerates prosthesis wear >90% of awake hours without skin or limb pain >2/10 issues. Baseline: SEE OBJECTIVE DATA Goal status: Ongoing   03/22/2024  Berg Balance >36/56 to indicate lower fall risk Baseline: SEE OBJECTIVE DATA Goal status: Ongoing   03/22/2024  Patient ambulates >250' with prosthesis with cane or less independently Baseline: SEE OBJECTIVE DATA Goal status: Ongoing   03/22/2024  Patient negotiates ramps, curbs & stairs with single rail with prosthesis  and cane or less independently. Baseline: SEE OBJECTIVE DATA Goal status: Ongoing   03/22/2024  8.  Patient reports Patient-Specific Activity Score improved to average 8 to indicate improvement in functional activities.   Baseline: SEE OBJECTIVE DATA Goal status: Ongoing  03/22/2024  PLAN:  PT FREQUENCY: 2x/week  PT DURATION: 12 weeks  PLANNED INTERVENTIONS: 97164- PT Re-evaluation, 97750- Physical Performance Testing,  97110-Therapeutic exercises, 97530- Therapeutic activity, [id number]- Neuromuscular re-education, [phone]- Self Care, [id number]- Gait training, [phone]- Prosthetic Initial , [phone]- Orthotic/Prosthetic subsequent, Patient/Family education, Balance training, and Stair training  PLAN FOR NEXT SESSION:  ***  introduce ramp & curb with forearm crutch.   prosthetic gait with forearm crutches working on terminal stance & swing prosthetic knee flexion and weight shift over prosthesis in stance, postural strengthening/flexibility, check hip flexor stretch for HEP     Grayce Spatz, PT, DPT 03/22/2024, 10:00 AM   Date of referral: 02/08/2024 Referring provider: Jerona Sage Referring diagnosis? Z89.611 (ICD-10-CM) - Hx of AKA (above knee amputation), right  Treatment diagnosis? (if different than referring diagnosis) R26.89, R29.6, R26.81, M62.81, M25.651  What was this (referring dx) caused by? Surgery (Type: right AKA) and [place] Hawks of Condition: Recurrent (multiple episodes of < 3 months)   Laterality: Rt  Current Functional Measure Score:  Berg Balance 20/56  Objective measurements identify impairments when they are compared to normal values, the uninvolved extremity, and prior level of function.  [x]  Yes  []  No  Objective assessment of functional ability: Moderate functional limitations   Briefly describe symptoms:  patient is dependent in prosthetic care.  Patient has recurrent falls and Berg Balance 20/56 indicates high fall risk.  Patient has dependent prosthetic gait with deviations indicating high fall risk.    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  He is dependent in use especially donning suction ring suspension correctly.   Average pain intensity:  Last 24 hours: 7/10  Past week: 7/10  How often does the pt experience symptoms? Frequently  How much have the symptoms interfered with usual daily activities? Quite a bit  How  has condition changed since care began at this facility? NA - initial visit  In general, how is the patients overall health? Good   BACK PAIN (STarT Back Screening Tool) No

## 2024-03-28 ENCOUNTER — Encounter: Admitting: Physical Therapy

## 2024-03-30 ENCOUNTER — Ambulatory Visit (INDEPENDENT_AMBULATORY_CARE_PROVIDER_SITE_OTHER): Admitting: Physical Therapy

## 2024-03-30 ENCOUNTER — Encounter: Payer: Self-pay | Admitting: Physical Therapy

## 2024-03-30 DIAGNOSIS — R2681 Unsteadiness on feet: Secondary | ICD-10-CM

## 2024-03-30 DIAGNOSIS — M6281 Muscle weakness (generalized): Secondary | ICD-10-CM | POA: Diagnosis not present

## 2024-03-30 DIAGNOSIS — R296 Repeated falls: Secondary | ICD-10-CM | POA: Diagnosis not present

## 2024-03-30 DIAGNOSIS — R2689 Other abnormalities of gait and mobility: Secondary | ICD-10-CM

## 2024-03-30 DIAGNOSIS — M25651 Stiffness of right hip, not elsewhere classified: Secondary | ICD-10-CM

## 2024-03-30 NOTE — Therapy (Signed)
 OUTPATIENT PHYSICAL THERAPY PROSTHETIC TREATMENT   Patient Name: Mark [name]. MRN: [id number] DOB:09/20/1955, 68 y.o., male Today's Date: 03/30/2024  END OF SESSION:  PT End of Session - 03/30/24 1517     Visit Number 5    Number of Visits 25    Date for Recertification  05/26/24    Authorization Type [employer] state Health and [facility] Medicare    Progress Note Due on Visit 10    PT Start Time 1517    PT Stop Time 1600    PT Time Calculation (min) 43 min    Equipment Utilized During Treatment Gait belt    Activity Tolerance Patient tolerated treatment well    Behavior During Therapy WFL for tasks assessed/performed             Past Medical History:  Diagnosis Date   Alcohol  abuse    Anemia    Arthritis    Ascites    Cirrhosis ([name])    Coffee ground emesis    Dehydration 06/17/2017   Febrile illness    GERD (gastroesophageal reflux disease)    Heart murmur    History of blood transfusion    Hyperlipidemia    Hypertension    Leg swelling    Myocardial infarction ([name]) 2012   Preop cardiovascular exam 04/14/2013   Sepsis ([name]) 06/17/2017   Septic shock ([name]) 06/18/2017   SIRS (systemic inflammatory response syndrome) ([name]) 07/11/2017   Stroke ([name]) 10/2009   TIA   Thrombocytopenia    Past Surgical History:  Procedure Laterality Date   AMPUTATION Right 11/07/2022   Procedure: RIGHT ABOVE KNEE AMPUTATION;  Surgeon: Harden Jerona GAILS, MD;  Location: [facility] OR;  Service: Orthopedics;  Laterality: Right;   COLONOSCOPY WITH PROPOFOL  N/A 02/07/2016   Procedure: COLONOSCOPY WITH PROPOFOL ;  Surgeon: Toribio SHAUNNA Cedar, MD;  Location: [facility];  Service: Endoscopy;  Laterality: N/A;   ERCP N/A 02/28/2023   Procedure: ENDOSCOPIC RETROGRADE CHOLANGIOPANCREATOGRAPHY (ERCP);  Surgeon: Rollin Dover, MD;  Location: [facility];  Service: Gastroenterology;  Laterality: N/A;   ESOPHAGOGASTRODUODENOSCOPY (EGD) WITH PROPOFOL  N/A 02/07/2016   Procedure: ESOPHAGOGASTRODUODENOSCOPY  (EGD) WITH PROPOFOL ;  Surgeon: Toribio SHAUNNA Cedar, MD;  Location: [facility];  Service: Endoscopy;  Laterality: N/A;   ESOPHAGOGASTRODUODENOSCOPY (EGD) WITH PROPOFOL  N/A 06/22/2017   Procedure: ESOPHAGOGASTRODUODENOSCOPY (EGD) WITH PROPOFOL ;  Surgeon: Albertus Gordy HERO, MD;  Location: [facility] ENDOSCOPY;  Service: Gastroenterology;  Laterality: N/A;   EXCISIONAL TOTAL KNEE ARTHROPLASTY WITH ANTIBIOTIC SPACERS Right 03/20/2020   Procedure: Resection right total knee arthroplasty and placement of antibiotic spacer;  Surgeon: Ernie Cough, MD;  Location: [facility];  Service: Orthopedics;  Laterality: Right;  90 mins   HERNIA REPAIR Right    inguinal   INGUINAL HERNIA REPAIR Left 02/08/2021   Procedure: OPEN LEFT INGUINAL HERNIA REPAIR WITH MESH;  Surgeon: Vernetta Berg, MD;  Location: [facility] OR;  Service: General;  Laterality: Left;   IRRIGATION AND DEBRIDEMENT KNEE Right 11/13/2020   Procedure: IRRIGATION AND DEBRIDEMENT KNEE;  Surgeon: Ernie Cough, MD;  Location: [facility];  Service: Orthopedics;  Laterality: Right;   JOINT REPLACEMENT     KNEE ARTHROSCOPY     bilateral/  12/14   REIMPLANTATION OF TOTAL KNEE Right 08/02/2020   Procedure: REIMPLANTATION/REVISION OF TOTAL KNEE WITH REMOVAL OF ANTIBIOTIC SPACER;  Surgeon: Ernie Cough, MD;  Location: [facility];  Service: Orthopedics;  Laterality: Right;    REMOVAL OF STONES  02/28/2023   Procedure: REMOVAL OF DEBRIS;  Surgeon: Rollin Dover, MD;  Location: [facility];  Service: Gastroenterology;;   ANNETT  02/28/2023   Procedure: ANNETT;  Surgeon: Rollin Dover, MD;  Location: [facility];  Service: Gastroenterology;;   TEE WITHOUT CARDIOVERSION N/A 05/13/2018   Procedure: TRANSESOPHAGEAL ECHOCARDIOGRAM (TEE);  Surgeon: Loni Soyla LABOR, MD;  Location: [facility] ENDOSCOPY;  Service: Cardiovascular;  Laterality: N/A;   TOTAL KNEE ARTHROPLASTY Right 05/02/2013   Procedure: RIGHT TOTAL KNEE ARTHROPLASTY;  Surgeon: Donnice JONETTA Car, MD;  Location: [facility]  ORS;  Service: Orthopedics;  Laterality: Right;   Patient Active Problem List   Diagnosis Date Noted   Acute blood loss anemia 03/02/2023   Choledocholithiasis 02/28/2023   HTN (hypertension) 02/28/2023   Hypokalemia 02/28/2023   Cholelithiasis with biliary obstruction 02/27/2023   Infection of total knee replacement 11/07/2022   Hx of AKA (above knee amputation), right ([name]) 11/07/2022   Cerebrovascular disease 07/08/2022   Renal cyst 07/08/2022   Protein-calorie malnutrition, severe 07/08/2022   Toxic metabolic encephalopathy 02/26/2022   Septic joint of right knee joint ([name]) 11/08/2020   S/P right TKA reimplantation 08/02/2020   Infection of total left knee replacement 03/20/2020   Cirrhosis of liver without ascites ([name])    AKI (acute kidney injury)    Bacteremia    Spinal stenosis of lumbar region without neurogenic claudication    Aspiration pneumonia ([name]) 09/24/2018   Abscess in epidural space of lumbar spine    MRSA bacteremia 09/21/2018   Infection of prosthetic right knee joint 09/20/2018   Anemia of chronic disease 09/20/2018   Hypoalbuminemia 09/20/2018   Hypoglycemia without diagnosis of diabetes mellitus 09/20/2018   Epidural abscess 09/20/2018   Hyperkalemia 05/18/2018   Edentulous 05/11/2018   Pancytopenia ([name]) 05/10/2018   CAD (coronary artery disease) 05/10/2018   Hepatic encephalopathy ([name]) 05/10/2018   Alcohol  abuse 05/10/2018   GERD (gastroesophageal reflux disease) 05/10/2018   Duodenal ulcer    Renal failure    Acute on chronic anemia    Hypotension 06/17/2017   Hyponatremia 06/17/2017   Leg edema, right 06/17/2017   Acute metabolic encephalopathy 06/17/2017   Anemia, iron  deficiency    Benign neoplasm of ascending colon    Hemorrhoids    Portal hypertensive gastropathy ([name])    Gastritis and gastroduodenitis    Esophageal varices without bleeding ([name])    H/O: CVA (cerebrovascular accident) 12/01/2013   ACS (acute coronary syndrome) ([name])  12/01/2013   Anasarca 12/01/2013   Polysubstance abuse ([name]) 12/01/2013   CKD (chronic kidney disease) stage 3, GFR 30-59 ml/min ([name]) 12/01/2013   S/P right TKA 05/02/2013   Murmur 04/14/2013   Right inguinal hernia 12/26/2010   Cirrhosis, alcoholic ([name]) 12/20/2010    PCP: Mercer Clotilda SAUNDERS, MD  REFERRING PROVIDER: Harden Jerona GAILS, MD  ONSET DATE: 02/08/2024 MD referral to PT  REFERRING DIAG: Z89.611 (ICD-10-CM) - Hx of AKA (above knee amputation), right   THERAPY DIAG:  Other abnormalities of gait and mobility  Repeated falls  Unsteadiness on feet  Muscle weakness (generalized)  Stiffness of right hip, not elsewhere classified  Rationale for Evaluation and Treatment: Rehabilitation  SUBJECTIVE:   SUBJECTIVE STATEMENT: Prosthesis seems short.  He is working on getting forearm crutches for Christmas.    PERTINENT HISTORY: right TFA 11/07/22, inguinal hernia repair BLEs, ETOH abuse, cirrhosis, arthritis, GERD, HTN, HLD, heart murmur, MI, TIA 2011, systemic inflammatory response syndrome   Patient underwent a right Transfemoral Amputation on 11/07/2022 due to infected TKA. Initial right TKA 2015 with antibiotic spacers 2021 & 2022 with I&D. He  got prosthesis on 02/10/2023. He had got new socket in April 2025.  The prosthesis slides off some times.  He is wearing prosthesis daily most of awake hours except if he is staying home.     PAIN:  NPRS scale: not rated this date  Pain location: right residual limb Pain description: soreness Aggravating factors: unknown Relieving factors: it goes away on its own  PRECAUTIONS: Fall  WEIGHT BEARING RESTRICTIONS: No  FALLS:  Has patient fallen in last 6 months? Yes. Number of falls >4 x/month. He denies injuries.  If prosthesis twist and catches prosthesis going up steps.   LIVING ENVIRONMENT: Lives with: lives with their spouse and dog 60#  Lives in: House Home Access: Stairs to enter Home layout: One level Stairs: Yes:  External: 3 steps to porch without rails and threshold into house Has following equipment at home: Single point cane, Environmental Consultant - 2 wheeled, Chief Operating Officer, Wheelchair (manual), and Tour manager  OCCUPATION:  retired does odd jobs lobbyist, estate manager/land agent, cut wood   PLOF: Independent  PATIENT GOALS:   He wants to walk   Next MD visit:  OBJECTIVE:   PATIENT SURVEYS:  Patient-Specific Activity Scoring Scheme  0 represents "unable to perform." 10 represents "able to perform at prior level. 0 1 2 3 4 5 6 7 8 9  10 (Date and Score)  Activity Eval     1.   Standing ADLs   8    2.  Walking with prosthesis  4    3.  Repair cars, yard work, chop wood 6   4.    5.    Score 6    Total score = sum of the activity scores/number of activities Minimum detectable change (90%CI) for average score = 2 points Minimum detectable change (90%CI) for single activity score = 3 points  COGNITION: Overall cognitive status: WFL    SENSATION: WFL  MUSCLE LENGTH: Evaluation on 02/29/2024: Debby test: Right -25 deg;  POSTURE:  Evaluation on 02/29/2024: rounded shoulders, forward head, flexed trunk , and weight shift left  LOWER EXTREMITY ROM:   ROM Left eval  Hip flexion   Hip extension -12*  Hip abduction   Hip adduction   Hip internal rotation   Hip external rotation   Knee flexion   Knee extension   Ankle dorsiflexion   Ankle plantarflexion   Ankle inversion   Ankle eversion    (Blank rows = not tested)  LOWER EXTREMITY MMT:  MMT Left eval  Hip flexion   Hip extension 4/5  Hip abduction 4/5  Hip adduction   Hip internal rotation   Hip external rotation   Knee flexion   Knee extension   Ankle dorsiflexion   Ankle plantarflexion   Ankle inversion   Ankle eversion    (Blank rows = not tested)  TRANSFERS: Evaluation on 02/29/2024: Sit to stand: SBA from 18 chair with armrest uses back of legs against chair to stabilize and requires external support of crutches  to position lower extremities for stabilization. Stand to sit: SBA requires use of armrest to control descent to 18 chair  FUNCTIONAL TESTs:  03/30/2024: Patient able to reach anteriorly 7 and look over shoulders without upper extremity support with supervision safely.  Evaluation on 02/29/2024: Lars Balance 20/56  [facility] PT Assessment - 02/29/24 1300       Standardized Balance Assessment   Standardized Balance Assessment Berg Balance Test      Berg Balance Test   Sit to Stand  Needs minimal aid to stand or to stabilize    Standing Unsupported Able to stand 2 minutes with supervision    Sitting with Back Unsupported but Feet Supported on Floor or Stool Able to sit safely and securely 2 minutes    Stand to Sit Controls descent by using hands    Transfers Able to transfer safely, definite need of hands    Standing Unsupported with Eyes Closed Unable to keep eyes closed 3 seconds but stays steady    Standing Unsupported with Feet Together Needs help to attain position but able to stand for 30 seconds with feet together    From Standing, Reach Forward with Outstretched Arm Reaches forward but needs supervision    From Standing Position, Pick up Object from Floor Unable to pick up and needs supervision    From Standing Position, Turn to Look Behind Over each Shoulder Needs supervision when turning    Turn 360 Degrees Needs assistance while turning    Standing Unsupported, Alternately Place Feet on Step/Stool Needs assistance to keep from falling or unable to try    Standing Unsupported, One Foot in [employer] balance while stepping or standing    Standing on One Leg Tries to lift leg/unable to hold 3 seconds but remains standing independently    Total Score 20    Berg comment: BERG  < 36 high risk for falls (close to 100%) 46-51 moderate (>50%)   37-45 significant (>80%) 52-55 lower (> 25%)         GAIT: 03/30/2024: Patient ambulates 200 feet with forearm crutches 4-point gait pattern  with prosthetic knee flexion approximately 50% of the time with supervision/PT cues. Patient negotiates ramp and curb with forearm crutches with PT supervision/verbal cues on technique.  Evaluation on 02/29/2024: Gait pattern: step to pattern, decreased step length- Left, decreased stance time- Right, decreased hip/knee flexion- Right, circumduction- Right, Right hip hike, antalgic, trunk flexed, and abducted- Right patient is fearful to unlock his prosthesis for swing phase. Pistoning noted with prosthesis due to suspension issue. Distance walked: 120' Assistive device utilized: Crutches and transfemoral prosthesis Level of assistance: SBA only when using crutches.  He would require greater assistance if he was using a cane or no device.  Gait velocity: 1.37 ft/sec Comments: Excessive weightbearing on axillary crutches  CURRENT PROSTHETIC WEAR ASSESSMENT: 03/30/2024: Patient reports wearing prosthesis greater than 12 hours/day with limb pain less than 5/10. Patient verbalizes proper donning of prosthesis.  Evaluation on 02/29/2024: Patient is independent with: skin check Patient is dependent with: prosthetic cleaning, correct ply sock adjustment, and proper wear schedule/adjustment Donning prosthesis: SBA patient needs cues for new suspension system with suction ring Doffing prosthesis: Modified independence Prosthetic wear tolerance: Patient reports wearing prosthesis most of awake hours on days that he is leaving his home.  On days that he stays home he reports not wearing the prosthesis at all. Prosthetic weight bearing tolerance: 10 minutes with no increase in residual limb pain reported Edema: None Residual limb condition: No open areas, cylindrical shape, normal temperature and moisture level.  Prosthetic description: Silicone liner with suction ring suspension, ischial containment socket with flexible inner socket.  Multiaxial knee.    TODAY'S TREATMENT:  DATE:  03/30/2024: Prosthetic Care with transfemoral prosthesis: Patient ambulates 200 feet with forearm crutches 4-point gait pattern with prosthetic knee flexion approximately 50% of the time with supervision/PT cues. Patient negotiates ramp and curb with forearm crutches with PT supervision/verbal cues on technique. PT assessed height of prosthesis which appears to be approximately 1 inch too short.  Neuromuscular reeducation: Standing balance (1 lift under prosthetic foot) with counter support posterior and chair back anterior for safety: Performing Zoom ball for 2 minutes with patient able to perform approximately 3 consecutive reps before having to touch chair for balance/stability. Active back extension flexing forward placing hands in seat of a chair under control and returning to upright position with active back extension 10 reps.  PT supervision for balance and safety including maintaining equal weightbearing through bilateral lower extremities.    TREATMENT:                                                                                                                             DATE:  03/16/2024: Prosthetic Care with transfemoral prosthesis: PT reviewed how tight hip flexors can cause prosthetic knee flexion earlier than he wants in gait cycle.  Some muscle soreness from new activities or use of muscle is normal.  Best thing is to continue with exercises.  Pt verbalized understanding.  Pt sit to/from stand chair with armrests to forearm crutches with verbal cues / SBA.  Pt amb 40' and 120' with forearm crutches with 3-point pattern with PT tactile & verbal cues on sequence, step length, initial contact with heel / extending prosthetic knee, weight shift over prosthesis and trying to stand upright as much as possible.  Pt neg 11 steps with single rail & 1 forearm crutch with PT cues on  technique and prosthetic knee ext for stance.  Switched side of rail half way so he understands how to negotiate with either rail available.    Manual Therapy:  Contract-relax and PT giving pressure to increase stretch.  Hip flexor stretch to facilitate upright posture.  Hooklying with RLE with prosthesis over edge 30 sec hold 3 reps.    Therapeutic Activities: Posterior pelvic tilt 5 sec hold 10 reps 2 sets with tactile & verbal cues.  Facilitating pelvic control for standing & gait activities.  Side lying RLE leg press manually (pelvic depression and hip extension) 10 reps.     TREATMENT:  DATE:  03/14/2024: Prosthetic Care with transfemoral prosthesis: PT verbal & demo cues on step length with heel passing contralateral toes, weight shift over prosthesis in stance and upright posture.  Pt amb 75' X 2 with axillary crutches with supervision.  Pt amb 75' with forearm crutches with supervision.  PT cues on carryover of above.  Pt to try to get forearm crutches for Christmas present from family.    Therapeutic Activities:   Standing with back to counter working on upright posture with ears over shoulders over hips over feet.   Back extension standing with pelvis ~ step in front of counter so he can catch himself if falling back and chair in front facing him.  Under control lean forward placing hands in chair seat then stand up straight for 5 sec.  10 reps with supervision.  Hip flexor stretch to facilitate upright posture.  Hooklying with RLE with prosthesis over edge 30 sec hold 3 reps.  PT giving light pressure to increase stretch. PT gave handout with verbal cues on above 3 exercises as HEP.     TREATMENT:                                                                                                                             DATE:  03/04/2024: Prosthetic Care with  transfemoral prosthesis: Patient independently doffed prosthesis and silicone liner in order for PT to assess skin as well as assess doffing and donning process. No skin lesions or breakdown noted during skin check that would support ill-fitting prosthesis. Patient verbalizing each step and appropriately donned transfemoral prosthesis using method that PT taught him at initial evaluation. Patient then performed a sit<>stand from mat table and described pulling sensation again. PT educated patient on hip flexion contracture that would explain the pulling sensation in the front of the thigh. However, patient was informed that if the discomfort continues after given hip flexor stretch (to be given at next session) and working on goals, patient would need to reach out to prosthetist for a socket fit check. Patient and spouse acknowledge and agree to plan.  Patient endorsed an episode of falling due to moment of forgetting crutches when chasing after the dog after lying back down while he didn't have his prosthesis on. PT educated patient on importance of putting prosthesis on as soon as he wakes up as there will be moments like these that occur where he will need quick movements and to be as safe as possible, prosthesis will at least keep him from falling onto residual limb. Patient acknowledged and agreed.    Neuro Re-Ed:  PT demonstrated below exercises and then had patient perform 1x10 of each activity while holding on to sink with CGA and verbal cues throughout. Even with verbal and tactile cues, patient returned to downward gaze and increased hip flexion after a few reps of each activity. One instance of slight Rt knee instability, but patient able to self correct with  UE use on sink and no required increase in physical assist. Printout of below activities provided for patient and spouse. Do each exercise 1-2  times per day Do each exercise 5-10 repetitions Hold each exercise for 2 seconds to feel your  location  AT SINK FIND YOUR MIDLINE POSITION AND PLACE FEET EQUAL DISTANCE FROM THE MIDLINE.  Try to find this position when standing still for activities.   USE TAPE ON FLOOR TO MARK THE MIDLINE POSITION which is even with middle of sink.  You also should try to feel with your limb pressure in socket.  You are trying to feel with limb what you used to feel with the bottom of your foot.  Side to Side Shift: Moving your hips only (not shoulders): move weight onto your left leg, HOLD/FEEL pressure in socket.  Move back to equal weight on each leg, HOLD/FEEL pressure in socket. Move weight onto your right leg, HOLD/FEEL pressure in socket. Move back to equal weight on each leg, HOLD/FEEL pressure in socket. Repeat.  Start with both hands on sink, progress to hand on prosthetic side only, then no hands.  Front to Back Shift: Moving your hips only (not shoulders): move your weight forward onto your toes, HOLD/FEEL pressure in socket. Move your weight back to equal Flat Foot on both legs, HOLD/FEEL  pressure in socket. Move your weight back onto your heels, HOLD/FEEL  pressure in socket. Move your weight back to equal on both legs, HOLD/FEEL  pressure in socket. Repeat.  Start with both hands on sink, progress to hand on prosthetic side only, then no hands.  Moving Cones / Cups: With equal weight on each leg: Hold on with one hand the first time, then progress to no hand supports. Move cups from one side of sink to the other. Place cups ~2" out of your reach, progress to 10" beyond reach.  Place one hand in middle of sink and reach with other hand. Do both arms.  Then hover one hand and move cups with other hand.  Overhead/Upward Reaching: alternated reaching up to top cabinets or ceiling if no cabinets present. Keep equal weight on each leg. Start with one hand support on counter while other hand reaches and progress to no hand support with reaching.  ace one hand in middle of sink and reach with other hand. Do  both arms.  Then hover one hand and move cups with other hand.  5.    Looking Over Shoulders: With equal weight on each leg: alternate turning to look over your shoulders with one hand support on counter as needed.  Start with head motions only to look in front of shoulder, then even with shoulder and progress to looking behind you. To look to side, move head /eyes, then shoulder on side looking pulls back, shift more weight to side looking and pull hip back. Place one hand in middle of sink and let go with other hand so your shoulder can pull back. Switch hands to look other way.   Then hover one hand and look over shoulder. If looking right, use left hand at sink. If looking left, use right hand at sink. 6.  Stepping with leg that is not amputated:  Move items under cabinet out of your way. Shift your hips/pelvis so weight on prosthesis. Tighten muscles in hip on prosthetic side.  SLOWLY step other leg so front of foot is in cabinet. Then step back to floor.      HOME EXERCISE  PROGRAM: Access Code: [email or web address] URL: [email or web address] Date: 03/14/2024 Prepared by: Grayce Spatz  Exercises - Standing posture with back to counter  - 1-3 x daily - 7 x weekly - 1 sets - 1 reps - 2-5 minutes hold - standing back extension  - 1-3 x daily - 7 x weekly - 1 sets - 10 reps - 5 seconds hold - Modified Thomas Stretch  - 1-3 x daily - 7 x weekly - 1 sets - 3 reps - 30 seconds hold   ASSESSMENT:  CLINICAL IMPRESSION: Met all short-term goals set for initial 30 days.  Patient's prosthesis appears to be approximately 1 inch to short which PT will contact prosthetist.  Patient appears to be functioning with forearm crutches with improve mobility over axillary crutches.  Patient was challenged by balance exercises facilitating upright trunk in stance.  Patient will continue to benefit from skilled PT.  OBJECTIVE IMPAIRMENTS: Abnormal gait, decreased activity tolerance, decreased balance,  decreased endurance, decreased knowledge of condition, decreased knowledge of use of DME, decreased mobility, difficulty walking, decreased ROM, decreased strength, postural dysfunction, prosthetic dependency , and pain.   ACTIVITY LIMITATIONS: carrying, lifting, sitting, standing, stairs, transfers, and locomotion level  PARTICIPATION LIMITATIONS: meal prep, cleaning, and community activity  PERSONAL FACTORS: Age, Past/current experiences, Time since onset of injury/illness/exacerbation, and 3+ comorbidities: see PMH are also affecting patient's functional outcome.   REHAB POTENTIAL: Good  CLINICAL DECISION MAKING: Evolving/moderate complexity  EVALUATION COMPLEXITY: Moderate   GOALS: Goals reviewed with patient? Yes  SHORT TERM GOALS: Target date: 03/30/2024  Patient donnes prosthesis modified independent correctly Baseline: SEE OBJECTIVE DATA Goal status: MET 03/30/2024 2.  Patient tolerates prosthesis wear daily for >12 hours without skin issues or limb pain over 5/10 Baseline: SEE OBJECTIVE DATA Goal status:  MET 03/30/2024  3.  Patient able to reach 7 and look over both shoulders without UE support with supervision. Baseline: SEE OBJECTIVE DATA Goal status:  MET 03/30/2024  4. Patient ambulates 200' with cruthes & prosthesis with supervision with prosthetic knee flexion >50% of steps for swing phase. Baseline: SEE OBJECTIVE DATA Goal status:    MET 03/30/2024  5. Patient negotiates ramps & curbs with crutches & prosthesis with supervision. Baseline: SEE OBJECTIVE DATA Goal status:  MET 03/30/2024  LONG TERM GOALS: Target date: 05/27/2023  Patient demonstrates & verbalized understanding of prosthetic care to enable safe utilization of prosthesis. Baseline: SEE OBJECTIVE DATA Goal status: Ongoing   03/22/2024  Patient tolerates prosthesis wear >90% of awake hours without skin or limb pain >2/10 issues. Baseline: SEE OBJECTIVE DATA Goal status: Ongoing   03/22/2024  Berg  Balance >36/56 to indicate lower fall risk Baseline: SEE OBJECTIVE DATA Goal status: Ongoing   03/22/2024  Patient ambulates >250' with prosthesis with cane or less independently Baseline: SEE OBJECTIVE DATA Goal status: Ongoing   03/22/2024  Patient negotiates ramps, curbs & stairs with single rail with prosthesis  and cane or less independently. Baseline: SEE OBJECTIVE DATA Goal status: Ongoing   03/22/2024  8.  Patient reports Patient-Specific Activity Score improved to average 8 to indicate improvement in functional activities.   Baseline: SEE OBJECTIVE DATA Goal status: Ongoing  03/22/2024  PLAN:  PT FREQUENCY: 2x/week  PT DURATION: 12 weeks  PLANNED INTERVENTIONS: 97164- PT Re-evaluation, 97750- Physical Performance Testing, 97110-Therapeutic exercises, 97530- Therapeutic activity, [id number]- Neuromuscular re-education, [phone]- Self Care, [id number]- Gait training, [phone]- Prosthetic Initial , [phone]- Orthotic/Prosthetic subsequent, Patient/Family education, Balance training, and Stair training  PLAN FOR NEXT SESSION:  continue  with prosthetic gait with forearm crutches working on terminal stance & swing prosthetic knee flexion and weight shift over prosthesis in stance and neg ramps / curbs, postural strengthening/flexibility, balance activities    Grayce Spatz, PT, DPT 03/30/2024, 6:24 PM   Date of referral: 02/08/2024 Referring provider: Jerona Sage Referring diagnosis? Z89.611 (ICD-10-CM) - Hx of AKA (above knee amputation), right  Treatment diagnosis? (if different than referring diagnosis) R26.89, R29.6, R26.81, M62.81, M25.651  What was this (referring dx) caused by? Surgery (Type: right AKA) and [place] Hawks of Condition: Recurrent (multiple episodes of < 3 months)   Laterality: Rt  Current Functional Measure Score:  Berg Balance 20/56  Objective measurements identify impairments when they are compared to normal values, the uninvolved extremity, and prior level of  function.  [x]  Yes  []  No  Objective assessment of functional ability: Moderate functional limitations   Briefly describe symptoms:  patient is dependent in prosthetic care.  Patient has recurrent falls and Berg Balance 20/56 indicates high fall risk.  Patient has dependent prosthetic gait with deviations indicating high fall risk.    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  He is dependent in use especially donning suction ring suspension correctly.   Average pain intensity:  Last 24 hours: 7/10  Past week: 7/10  How often does the pt experience symptoms? Frequently  How much have the symptoms interfered with usual daily activities? Quite a bit  How has condition changed since care began at this facility? NA - initial visit  In general, how is the patients overall health? Good   BACK PAIN (STarT Back Screening Tool) No

## 2024-05-03 ENCOUNTER — Encounter: Admitting: Physical Therapy

## 2024-05-03 NOTE — Therapy (Incomplete)
 "  OUTPATIENT PHYSICAL THERAPY PROSTHETIC TREATMENT   Patient Name: Mark [name]. MRN: [id number] DOB:Mar 17, 1956, 69 y.o., male Today's Date: 05/03/2024  END OF SESSION:       Past Medical History:  Diagnosis Date   Alcohol  abuse    Anemia    Arthritis    Ascites    Cirrhosis ([name])    Coffee ground emesis    Dehydration 06/17/2017   Febrile illness    GERD (gastroesophageal reflux disease)    Heart murmur    History of blood transfusion    Hyperlipidemia    Hypertension    Leg swelling    Myocardial infarction ([name]) 2012   Preop cardiovascular exam 04/14/2013   Sepsis ([name]) 06/17/2017   Septic shock ([name]) 06/18/2017   SIRS (systemic inflammatory response syndrome) ([name]) 07/11/2017   Stroke ([name]) 10/2009   TIA   Thrombocytopenia    Past Surgical History:  Procedure Laterality Date   AMPUTATION Right 11/07/2022   Procedure: RIGHT ABOVE KNEE AMPUTATION;  Surgeon: Harden Jerona GAILS, MD;  Location: [facility] OR;  Service: Orthopedics;  Laterality: Right;   COLONOSCOPY WITH PROPOFOL  N/A 02/07/2016   Procedure: COLONOSCOPY WITH PROPOFOL ;  Surgeon: Toribio SHAUNNA Cedar, MD;  Location: [facility];  Service: Endoscopy;  Laterality: N/A;   ERCP N/A 02/28/2023   Procedure: ENDOSCOPIC RETROGRADE CHOLANGIOPANCREATOGRAPHY (ERCP);  Surgeon: Rollin Dover, MD;  Location: [facility];  Service: Gastroenterology;  Laterality: N/A;   ESOPHAGOGASTRODUODENOSCOPY (EGD) WITH PROPOFOL  N/A 02/07/2016   Procedure: ESOPHAGOGASTRODUODENOSCOPY (EGD) WITH PROPOFOL ;  Surgeon: Toribio SHAUNNA Cedar, MD;  Location: [facility];  Service: Endoscopy;  Laterality: N/A;   ESOPHAGOGASTRODUODENOSCOPY (EGD) WITH PROPOFOL  N/A 06/22/2017   Procedure: ESOPHAGOGASTRODUODENOSCOPY (EGD) WITH PROPOFOL ;  Surgeon: Albertus Gordy HERO, MD;  Location: [facility] ENDOSCOPY;  Service: Gastroenterology;  Laterality: N/A;   EXCISIONAL TOTAL KNEE ARTHROPLASTY WITH ANTIBIOTIC SPACERS Right 03/20/2020   Procedure: Resection right total knee  arthroplasty and placement of antibiotic spacer;  Surgeon: Ernie Cough, MD;  Location: [facility] ORS;  Service: Orthopedics;  Laterality: Right;  90 mins   HERNIA REPAIR Right    inguinal   INGUINAL HERNIA REPAIR Left 02/08/2021   Procedure: OPEN LEFT INGUINAL HERNIA REPAIR WITH MESH;  Surgeon: Vernetta Berg, MD;  Location: [facility] OR;  Service: General;  Laterality: Left;   IRRIGATION AND DEBRIDEMENT KNEE Right 11/13/2020   Procedure: IRRIGATION AND DEBRIDEMENT KNEE;  Surgeon: Ernie Cough, MD;  Location: [facility]S;  Service: Orthopedics;  Laterality: Right;   JOINT REPLACEMENT     KNEE ARTHROSCOPY     bilateral/  12/14   REIMPLANTATION OF TOTAL KNEE Right 08/02/2020   Procedure: REIMPLANTATION/REVISION OF TOTAL KNEE WITH REMOVAL OF ANTIBIOTIC SPACER;  Surgeon: Ernie Cough, MD;  Location: [facility];  Service: Orthopedics;  Laterality: Right;    REMOVAL OF STONES  02/28/2023   Procedure: REMOVAL OF DEBRIS;  Surgeon: Rollin Dover, MD;  Location: [facility];  Service: Gastroenterology;;   ANNETT  02/28/2023   Procedure: ANNETT;  Surgeon: Rollin Dover, MD;  Location: [facility];  Service: Gastroenterology;;   TEE WITHOUT CARDIOVERSION N/A 05/13/2018   Procedure: TRANSESOPHAGEAL ECHOCARDIOGRAM (TEE);  Surgeon: Loni Soyla LABOR, MD;  Location: [facility] ENDOSCOPY;  Service: Cardiovascular;  Laterality: N/A;   TOTAL KNEE ARTHROPLASTY Right 05/02/2013   Procedure: RIGHT TOTAL KNEE ARTHROPLASTY;  Surgeon: Cough JONETTA Ernie, MD;  Location: [facility];  Service: Orthopedics;  Laterality: Right;   Patient Active Problem List   Diagnosis Date Noted   Acute blood loss anemia 03/02/2023  Choledocholithiasis 02/28/2023   HTN (hypertension) 02/28/2023   Hypokalemia 02/28/2023   Cholelithiasis with biliary obstruction 02/27/2023   Infection of total knee replacement 11/07/2022   Hx of AKA (above knee amputation), right ([name]) 11/07/2022   Cerebrovascular disease 07/08/2022   Renal cyst 07/08/2022    Protein-calorie malnutrition, severe 07/08/2022   Toxic metabolic encephalopathy 02/26/2022   Septic joint of right knee joint ([name]) 11/08/2020   S/P right TKA reimplantation 08/02/2020   Infection of total left knee replacement 03/20/2020   Cirrhosis of liver without ascites ([name])    AKI (acute kidney injury)    Bacteremia    Spinal stenosis of lumbar region without neurogenic claudication    Aspiration pneumonia ([name]) 09/24/2018   Abscess in epidural space of lumbar spine    MRSA bacteremia 09/21/2018   Infection of prosthetic right knee joint 09/20/2018   Anemia of chronic disease 09/20/2018   Hypoalbuminemia 09/20/2018   Hypoglycemia without diagnosis of diabetes mellitus 09/20/2018   Epidural abscess 09/20/2018   Hyperkalemia 05/18/2018   Edentulous 05/11/2018   Pancytopenia ([name]) 05/10/2018   CAD (coronary artery disease) 05/10/2018   Hepatic encephalopathy ([name]) 05/10/2018   Alcohol  abuse 05/10/2018   GERD (gastroesophageal reflux disease) 05/10/2018   Duodenal ulcer    Renal failure    Acute on chronic anemia    Hypotension 06/17/2017   Hyponatremia 06/17/2017   Leg edema, right 06/17/2017   Acute metabolic encephalopathy 06/17/2017   Anemia, iron  deficiency    Benign neoplasm of ascending colon    Hemorrhoids    Portal hypertensive gastropathy ([name])    Gastritis and gastroduodenitis    Esophageal varices without bleeding ([name])    H/O: CVA (cerebrovascular accident) 12/01/2013   ACS (acute coronary syndrome) ([name]) 12/01/2013   Anasarca 12/01/2013   Polysubstance abuse ([name]) 12/01/2013   CKD (chronic kidney disease) stage 3, GFR 30-59 ml/min ([name]) 12/01/2013   S/P right TKA 05/02/2013   Murmur 04/14/2013   Right inguinal hernia 12/26/2010   Cirrhosis, alcoholic ([name]) 12/20/2010    PCP: Mercer Clotilda SAUNDERS, MD  REFERRING PROVIDER: Harden Jerona GAILS, MD  ONSET DATE: 02/08/2024 MD referral to PT  REFERRING DIAG: Z89.611 (ICD-10-CM) - Hx of AKA (above knee  amputation), right   THERAPY DIAG:  No diagnosis found.  Rationale for Evaluation and Treatment: Rehabilitation  SUBJECTIVE:   SUBJECTIVE STATEMENT: ***  Prosthesis seems short.  He is working on getting forearm crutches for Christmas.    PERTINENT HISTORY: right TFA 11/07/22, inguinal hernia repair BLEs, ETOH abuse, cirrhosis, arthritis, GERD, HTN, HLD, heart murmur, MI, TIA 2011, systemic inflammatory response syndrome   Patient underwent a right Transfemoral Amputation on 11/07/2022 due to infected TKA. Initial right TKA 2015 with antibiotic spacers 2021 & 2022 with I&D. He got prosthesis on 02/10/2023. He had got new socket in April 2025.  The prosthesis slides off some times.  He is wearing prosthesis daily most of awake hours except if he is staying home.     PAIN:  NPRS scale: not rated this date  Pain location: right residual limb Pain description: soreness Aggravating factors: unknown Relieving factors: it goes away on its own  PRECAUTIONS: Fall  WEIGHT BEARING RESTRICTIONS: No  FALLS:  Has patient fallen in last 6 months? Yes. Number of falls >4 x/month. He denies injuries.  If prosthesis twist and catches prosthesis going up steps.   LIVING ENVIRONMENT: Lives with: lives with their spouse and dog 60#  Lives in: House Home Access: Stairs to enter  Home layout: One level Stairs: Yes: External: 3 steps to porch without rails and threshold into house Has following equipment at home: Single point cane, Walker - 2 wheeled, Chief Operating Officer, Wheelchair (manual), and Tour manager  OCCUPATION:  retired does odd jobs lobbyist, estate manager/land agent, cut wood   PLOF: Independent  PATIENT GOALS:   He wants to walk   Next MD visit:  OBJECTIVE:   PATIENT SURVEYS:  Patient-Specific Activity Scoring Scheme  0 represents unable to perform. 10 represents able to perform at prior level. 0 1 2 3 4 5 6 7 8 9  10 (Date and Score)  Activity Eval     1.   Standing ADLs   8     2.  Walking with prosthesis  4    3.  Repair cars, yard work, chop wood 6   4.    5.    Score 6    Total score = sum of the activity scores/number of activities Minimum detectable change (90%CI) for average score = 2 points Minimum detectable change (90%CI) for single activity score = 3 points  COGNITION: Overall cognitive status: WFL    SENSATION: WFL  MUSCLE LENGTH: Evaluation on 02/29/2024: Debby test: Right -25 deg;  POSTURE:  Evaluation on 02/29/2024: rounded shoulders, forward head, flexed trunk , and weight shift left  LOWER EXTREMITY ROM:   ROM Left eval  Hip flexion   Hip extension -12*  Hip abduction   Hip adduction   Hip internal rotation   Hip external rotation   Knee flexion   Knee extension   Ankle dorsiflexion   Ankle plantarflexion   Ankle inversion   Ankle eversion    (Blank rows = not tested)  LOWER EXTREMITY MMT:  MMT Left eval  Hip flexion   Hip extension 4/5  Hip abduction 4/5  Hip adduction   Hip internal rotation   Hip external rotation   Knee flexion   Knee extension   Ankle dorsiflexion   Ankle plantarflexion   Ankle inversion   Ankle eversion    (Blank rows = not tested)  TRANSFERS: Evaluation on 02/29/2024: Sit to stand: SBA from 18 chair with armrest uses back of legs against chair to stabilize and requires external support of crutches to position lower extremities for stabilization. Stand to sit: SBA requires use of armrest to control descent to 18 chair  FUNCTIONAL TESTs:  03/30/2024: Patient able to reach anteriorly 7 and look over shoulders without upper extremity support with supervision safely.  Evaluation on 02/29/2024: Berg Balance 20/56  [facility] PT Assessment - 02/29/24 1300       Standardized Balance Assessment   Standardized Balance Assessment Berg Balance Test      Berg Balance Test   Sit to Stand Needs minimal aid to stand or to stabilize    Standing Unsupported Able to stand 2 minutes with  supervision    Sitting with Back Unsupported but Feet Supported on Floor or Stool Able to sit safely and securely 2 minutes    Stand to Sit Controls descent by using hands    Transfers Able to transfer safely, definite need of hands    Standing Unsupported with Eyes Closed Unable to keep eyes closed 3 seconds but stays steady    Standing Unsupported with Feet Together Needs help to attain position but able to stand for 30 seconds with feet together    From Standing, Reach Forward with Outstretched Arm Reaches forward but needs supervision    From Standing  Position, Pick up Object from Floor Unable to pick up and needs supervision    From Standing Position, Turn to Look Behind Over each Shoulder Needs supervision when turning    Turn 360 Degrees Needs assistance while turning    Standing Unsupported, Alternately Place Feet on Step/Stool Needs assistance to keep from falling or unable to try    Standing Unsupported, One Foot in Front Loses balance while stepping or standing    Standing on One Leg Tries to lift leg/unable to hold 3 seconds but remains standing independently    Total Score 20    Berg comment: BERG  < 36 high risk for falls (close to 100%) 46-51 moderate (>50%)   37-45 significant (>80%) 52-55 lower (> 25%)         GAIT: 03/30/2024: Patient ambulates 200 feet with forearm crutches 4-point gait pattern with prosthetic knee flexion approximately 50% of the time with supervision/PT cues. Patient negotiates ramp and curb with forearm crutches with PT supervision/verbal cues on technique.  Evaluation on 02/29/2024: Gait pattern: step to pattern, decreased step length- Left, decreased stance time- Right, decreased hip/knee flexion- Right, circumduction- Right, Right hip hike, antalgic, trunk flexed, and abducted- Right patient is fearful to unlock his prosthesis for swing phase. Pistoning noted with prosthesis due to suspension issue. Distance walked: 120' Assistive device utilized:  Crutches and transfemoral prosthesis Level of assistance: SBA only when using crutches.  He would require greater assistance if he was using a cane or no device.  Gait velocity: 1.37 ft/sec Comments: Excessive weightbearing on axillary crutches  CURRENT PROSTHETIC WEAR ASSESSMENT: 03/30/2024: Patient reports wearing prosthesis greater than 12 hours/day with limb pain less than 5/10. Patient verbalizes proper donning of prosthesis.  Evaluation on 02/29/2024: Patient is independent with: skin check Patient is dependent with: prosthetic cleaning, correct ply sock adjustment, and proper wear schedule/adjustment Donning prosthesis: SBA patient needs cues for new suspension system with suction ring Doffing prosthesis: Modified independence Prosthetic wear tolerance: Patient reports wearing prosthesis most of awake hours on days that he is leaving his home.  On days that he stays home he reports not wearing the prosthesis at all. Prosthetic weight bearing tolerance: 10 minutes with no increase in residual limb pain reported Edema: None Residual limb condition: No open areas, cylindrical shape, normal temperature and moisture level.  Prosthetic description: Silicone liner with suction ring suspension, ischial containment socket with flexible inner socket.  Multiaxial knee.    TODAY'S TREATMENT:                                                                                                                             DATE:  05/03/2024: Prosthetic Care with transfemoral prosthesis: *** Patient ambulates 200 feet with forearm crutches 4-point gait pattern with prosthetic knee flexion approximately 50% of the time with supervision/PT cues. Patient negotiates ramp and curb with forearm crutches with PT supervision/verbal cues on technique. PT assessed height of prosthesis which appears to  be approximately 1 inch too short.  Neuromuscular reeducation: Standing balance (1 lift under prosthetic foot) with  counter support posterior and chair back anterior for safety: Performing Zoom ball for 2 minutes with patient able to perform approximately 3 consecutive reps before having to touch chair for balance/stability. Active back extension flexing forward placing hands in seat of a chair under control and returning to upright position with active back extension 10 reps.  PT supervision for balance and safety including maintaining equal weightbearing through bilateral lower extremities.    TREATMENT:                                                                                                                             DATE:  03/30/2024: Prosthetic Care with transfemoral prosthesis: Patient ambulates 200 feet with forearm crutches 4-point gait pattern with prosthetic knee flexion approximately 50% of the time with supervision/PT cues. Patient negotiates ramp and curb with forearm crutches with PT supervision/verbal cues on technique. PT assessed height of prosthesis which appears to be approximately 1 inch too short.  Neuromuscular reeducation: Standing balance (1 lift under prosthetic foot) with counter support posterior and chair back anterior for safety: Performing Zoom ball for 2 minutes with patient able to perform approximately 3 consecutive reps before having to touch chair for balance/stability. Active back extension flexing forward placing hands in seat of a chair under control and returning to upright position with active back extension 10 reps.  PT supervision for balance and safety including maintaining equal weightbearing through bilateral lower extremities.    TREATMENT:                                                                                                                             DATE:  03/16/2024: Prosthetic Care with transfemoral prosthesis: PT reviewed how tight hip flexors can cause prosthetic knee flexion earlier than he wants in gait cycle.  Some muscle soreness from  new activities or use of muscle is normal.  Best thing is to continue with exercises.  Pt verbalized understanding.  Pt sit to/from stand chair with armrests to forearm crutches with verbal cues / SBA.  Pt amb 40' and 120' with forearm crutches with 3-point pattern with PT tactile & verbal cues on sequence, step length, initial contact with heel / extending prosthetic knee, weight shift over prosthesis and trying to stand upright as much as possible.  Pt neg 11  steps with single rail & 1 forearm crutch with PT cues on technique and prosthetic knee ext for stance.  Switched side of rail half way so he understands how to negotiate with either rail available.    Manual Therapy:  Contract-relax and PT giving pressure to increase stretch.  Hip flexor stretch to facilitate upright posture.  Hooklying with RLE with prosthesis over edge 30 sec hold 3 reps.    Therapeutic Activities: Posterior pelvic tilt 5 sec hold 10 reps 2 sets with tactile & verbal cues.  Facilitating pelvic control for standing & gait activities.  Side lying RLE leg press manually (pelvic depression and hip extension) 10 reps.     TREATMENT:                                                                                                                             DATE:  03/14/2024: Prosthetic Care with transfemoral prosthesis: PT verbal & demo cues on step length with heel passing contralateral toes, weight shift over prosthesis in stance and upright posture.  Pt amb 75' X 2 with axillary crutches with supervision.  Pt amb 75' with forearm crutches with supervision.  PT cues on carryover of above.  Pt to try to get forearm crutches for Christmas present from family.    Therapeutic Activities:   Standing with back to counter working on upright posture with ears over shoulders over hips over feet.   Back extension standing with pelvis ~ step in front of counter so he can catch himself if falling back and chair in front facing him.   Under control lean forward placing hands in chair seat then stand up straight for 5 sec.  10 reps with supervision.  Hip flexor stretch to facilitate upright posture.  Hooklying with RLE with prosthesis over edge 30 sec hold 3 reps.  PT giving light pressure to increase stretch. PT gave handout with verbal cues on above 3 exercises as HEP.      HOME EXERCISE PROGRAM: Access Code: [email or web address] URL: [email or web address] Date: 03/14/2024 Prepared by: Grayce Spatz  Exercises - Standing posture with back to counter  - 1-3 x daily - 7 x weekly - 1 sets - 1 reps - 2-5 minutes hold - standing back extension  - 1-3 x daily - 7 x weekly - 1 sets - 10 reps - 5 seconds hold - Modified Thomas Stretch  - 1-3 x daily - 7 x weekly - 1 sets - 3 reps - 30 seconds hold   ASSESSMENT:  CLINICAL IMPRESSION: ***  Met all short-term goals set for initial 30 days.  Patient's prosthesis appears to be approximately 1 inch to short which PT will contact prosthetist.  Patient appears to be functioning with forearm crutches with improve mobility over axillary crutches.  Patient was challenged by balance exercises facilitating upright trunk in stance.  Patient will continue to benefit from skilled PT.  OBJECTIVE IMPAIRMENTS: Abnormal gait, decreased activity tolerance, decreased balance,  decreased endurance, decreased knowledge of condition, decreased knowledge of use of DME, decreased mobility, difficulty walking, decreased ROM, decreased strength, postural dysfunction, prosthetic dependency , and pain.   ACTIVITY LIMITATIONS: carrying, lifting, sitting, standing, stairs, transfers, and locomotion level  PARTICIPATION LIMITATIONS: meal prep, cleaning, and community activity  PERSONAL FACTORS: Age, Past/current experiences, Time since onset of injury/illness/exacerbation, and 3+ comorbidities: see PMH are also affecting patient's functional outcome.   REHAB POTENTIAL: Good  CLINICAL DECISION MAKING:  Evolving/moderate complexity  EVALUATION COMPLEXITY: Moderate   GOALS: Goals reviewed with patient? Yes  SHORT TERM GOALS: Target date: 03/30/2024  Patient donnes prosthesis modified independent correctly Baseline: SEE OBJECTIVE DATA Goal status: MET 03/30/2024 2.  Patient tolerates prosthesis wear daily for >12 hours without skin issues or limb pain over 5/10 Baseline: SEE OBJECTIVE DATA Goal status:  MET 03/30/2024  3.  Patient able to reach 7 and look over both shoulders without UE support with supervision. Baseline: SEE OBJECTIVE DATA Goal status:  MET 03/30/2024  4. Patient ambulates 200' with cruthes & prosthesis with supervision with prosthetic knee flexion >50% of steps for swing phase. Baseline: SEE OBJECTIVE DATA Goal status:    MET 03/30/2024  5. Patient negotiates ramps & curbs with crutches & prosthesis with supervision. Baseline: SEE OBJECTIVE DATA Goal status:  MET 03/30/2024  LONG TERM GOALS: Target date: 05/27/2023  Patient demonstrates & verbalized understanding of prosthetic care to enable safe utilization of prosthesis. Baseline: SEE OBJECTIVE DATA Goal status: Ongoing    05/03/2024  Patient tolerates prosthesis wear >90% of awake hours without skin or limb pain >2/10 issues. Baseline: SEE OBJECTIVE DATA Goal status: Ongoing    05/03/2024  Berg Balance >36/56 to indicate lower fall risk Baseline: SEE OBJECTIVE DATA Goal status: Ongoing    05/03/2024  Patient ambulates >250' with prosthesis with cane or less independently Baseline: SEE OBJECTIVE DATA Goal status: Ongoing   05/03/2024  Patient negotiates ramps, curbs & stairs with single rail with prosthesis  and cane or less independently. Baseline: SEE OBJECTIVE DATA Goal status: Ongoing    05/03/2024  8.  Patient reports Patient-Specific Activity Score improved to average 8 to indicate improvement in functional activities.   Baseline: SEE OBJECTIVE DATA Goal status: Ongoing  05/03/2024  PLAN:  PT FREQUENCY:  2x/week  PT DURATION: 12 weeks  PLANNED INTERVENTIONS: 97164- PT Re-evaluation, 97750- Physical Performance Testing, 97110-Therapeutic exercises, 97530- Therapeutic activity, 97112- Neuromuscular re-education, [phone]- Self Care, 02883- Gait training, [phone]- Prosthetic Initial , [phone]- Orthotic/Prosthetic subsequent, Patient/Family education, Balance training, and Stair training  PLAN FOR NEXT SESSION:  *** continue with prosthetic gait with forearm crutches working on terminal stance & swing prosthetic knee flexion and weight shift over prosthesis in stance and neg ramps / curbs, postural strengthening/flexibility, balance activities    Grayce Spatz, PT, DPT 05/03/2024, 12:48 PM   Date of referral: 02/08/2024 Referring provider: Jerona Sage Referring diagnosis? Z89.611 (ICD-10-CM) - Hx of AKA (above knee amputation), right  Treatment diagnosis? (if different than referring diagnosis) R26.89, R29.6, R26.81, M62.81, M25.651  What was this (referring dx) caused by? Surgery (Type: right AKA) and [place] Hawks of Condition: Recurrent (multiple episodes of < 3 months)   Laterality: Rt  Current Functional Measure Score:  Berg Balance 20/56  Objective measurements identify impairments when they are compared to normal values, the uninvolved extremity, and prior level of function.  [x]  Yes  []  No  Objective assessment of functional ability: Moderate functional limitations   Briefly describe symptoms:  patient is dependent  in prosthetic care.  Patient has recurrent falls and Berg Balance 20/56 indicates high fall risk.  Patient has dependent prosthetic gait with deviations indicating high fall risk.    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  He is dependent in use especially donning suction ring suspension correctly.   Average pain intensity:  Last 24 hours: 7/10  Past week: 7/10  How often does the pt experience symptoms?  Frequently  How much have the symptoms interfered with usual daily activities? Quite a bit  How has condition changed since care began at this facility? NA - initial visit  In general, how is the patients overall health? Good   BACK PAIN (STarT Back Screening Tool) No  "

## 2024-05-05 ENCOUNTER — Ambulatory Visit (INDEPENDENT_AMBULATORY_CARE_PROVIDER_SITE_OTHER)

## 2024-05-05 DIAGNOSIS — R2681 Unsteadiness on feet: Secondary | ICD-10-CM

## 2024-05-05 DIAGNOSIS — R296 Repeated falls: Secondary | ICD-10-CM

## 2024-05-05 DIAGNOSIS — M25651 Stiffness of right hip, not elsewhere classified: Secondary | ICD-10-CM | POA: Diagnosis not present

## 2024-05-05 DIAGNOSIS — R2689 Other abnormalities of gait and mobility: Secondary | ICD-10-CM | POA: Diagnosis not present

## 2024-05-05 DIAGNOSIS — M6281 Muscle weakness (generalized): Secondary | ICD-10-CM

## 2024-05-05 NOTE — Therapy (Signed)
 "  OUTPATIENT PHYSICAL THERAPY PROSTHETIC TREATMENT   Patient Name: Mark [name]. MRN: [id number] DOB:August 07, 1955, 69 y.o., male Today's Date: 05/05/2024  END OF SESSION:  PT End of Session - 05/05/24 1430     Visit Number 6    Number of Visits 25    Date for Recertification  05/26/24    Authorization Type [employer] state Health and [facility] Medicare    Progress Note Due on Visit 10    PT Start Time 1432    PT Stop Time 1513    PT Time Calculation (min) 41 min    Equipment Utilized During Treatment Gait belt    Activity Tolerance Patient tolerated treatment well    Behavior During Therapy WFL for tasks assessed/performed           Past Medical History:  Diagnosis Date   Alcohol  abuse    Anemia    Arthritis    Ascites    Cirrhosis ([name])    Coffee ground emesis    Dehydration 06/17/2017   Febrile illness    GERD (gastroesophageal reflux disease)    Heart murmur    History of blood transfusion    Hyperlipidemia    Hypertension    Leg swelling    Myocardial infarction ([name]) 2012   Preop cardiovascular exam 04/14/2013   Sepsis ([name]) 06/17/2017   Septic shock ([name]) 06/18/2017   SIRS (systemic inflammatory response syndrome) ([name]) 07/11/2017   Stroke ([name]) 10/2009   TIA   Thrombocytopenia    Past Surgical History:  Procedure Laterality Date   AMPUTATION Right 11/07/2022   Procedure: RIGHT ABOVE KNEE AMPUTATION;  Surgeon: Harden Jerona GAILS, MD;  Location: [facility] OR;  Service: Orthopedics;  Laterality: Right;   COLONOSCOPY WITH PROPOFOL  N/A 02/07/2016   Procedure: COLONOSCOPY WITH PROPOFOL ;  Surgeon: Toribio SHAUNNA Cedar, MD;  Location: [facility];  Service: Endoscopy;  Laterality: N/A;   ERCP N/A 02/28/2023   Procedure: ENDOSCOPIC RETROGRADE CHOLANGIOPANCREATOGRAPHY (ERCP);  Surgeon: Rollin Dover, MD;  Location: [facility];  Service: Gastroenterology;  Laterality: N/A;   ESOPHAGOGASTRODUODENOSCOPY (EGD) WITH PROPOFOL  N/A 02/07/2016   Procedure: ESOPHAGOGASTRODUODENOSCOPY (EGD)  WITH PROPOFOL ;  Surgeon: Toribio SHAUNNA Cedar, MD;  Location: [facility];  Service: Endoscopy;  Laterality: N/A;   ESOPHAGOGASTRODUODENOSCOPY (EGD) WITH PROPOFOL  N/A 06/22/2017   Procedure: ESOPHAGOGASTRODUODENOSCOPY (EGD) WITH PROPOFOL ;  Surgeon: Albertus Gordy HERO, MD;  Location: [facility] ENDOSCOPY;  Service: Gastroenterology;  Laterality: N/A;   EXCISIONAL TOTAL KNEE ARTHROPLASTY WITH ANTIBIOTIC SPACERS Right 03/20/2020   Procedure: Resection right total knee arthroplasty and placement of antibiotic spacer;  Surgeon: Ernie Cough, MD;  Location: [facility] ORS;  Service: Orthopedics;  Laterality: Right;  90 mins   HERNIA REPAIR Right    inguinal   INGUINAL HERNIA REPAIR Left 02/08/2021   Procedure: OPEN LEFT INGUINAL HERNIA REPAIR WITH MESH;  Surgeon: Vernetta Berg, MD;  Location: [facility] OR;  Service: General;  Laterality: Left;   IRRIGATION AND DEBRIDEMENT KNEE Right 11/13/2020   Procedure: IRRIGATION AND DEBRIDEMENT KNEE;  Surgeon: Ernie Cough, MD;  Location: [facility];  Service: Orthopedics;  Laterality: Right;   JOINT REPLACEMENT     KNEE ARTHROSCOPY     bilateral/  12/14   REIMPLANTATION OF TOTAL KNEE Right 08/02/2020   Procedure: REIMPLANTATION/REVISION OF TOTAL KNEE WITH REMOVAL OF ANTIBIOTIC SPACER;  Surgeon: Ernie Cough, MD;  Location: [facility];  Service: Orthopedics;  Laterality: Right;    REMOVAL OF STONES  02/28/2023   Procedure: REMOVAL OF DEBRIS;  Surgeon: Rollin Dover, MD;  Location: [facility];  Service: Gastroenterology;;   ANNETT  02/28/2023   Procedure: ANNETT;  Surgeon: Rollin Dover, MD;  Location: [facility];  Service: Gastroenterology;;   TEE WITHOUT CARDIOVERSION N/A 05/13/2018   Procedure: TRANSESOPHAGEAL ECHOCARDIOGRAM (TEE);  Surgeon: Loni Soyla LABOR, MD;  Location: [facility] ENDOSCOPY;  Service: Cardiovascular;  Laterality: N/A;   TOTAL KNEE ARTHROPLASTY Right 05/02/2013   Procedure: RIGHT TOTAL KNEE ARTHROPLASTY;  Surgeon: Donnice JONETTA Car, MD;  Location: [facility];   Service: Orthopedics;  Laterality: Right;   Patient Active Problem List   Diagnosis Date Noted   Acute blood loss anemia 03/02/2023   Choledocholithiasis 02/28/2023   HTN (hypertension) 02/28/2023   Hypokalemia 02/28/2023   Cholelithiasis with biliary obstruction 02/27/2023   Infection of total knee replacement 11/07/2022   Hx of AKA (above knee amputation), right ([name]) 11/07/2022   Cerebrovascular disease 07/08/2022   Renal cyst 07/08/2022   Protein-calorie malnutrition, severe 07/08/2022   Toxic metabolic encephalopathy 02/26/2022   Septic joint of right knee joint ([name]) 11/08/2020   S/P right TKA reimplantation 08/02/2020   Infection of total left knee replacement 03/20/2020   Cirrhosis of liver without ascites ([name])    AKI (acute kidney injury)    Bacteremia    Spinal stenosis of lumbar region without neurogenic claudication    Aspiration pneumonia ([name]) 09/24/2018   Abscess in epidural space of lumbar spine    MRSA bacteremia 09/21/2018   Infection of prosthetic right knee joint 09/20/2018   Anemia of chronic disease 09/20/2018   Hypoalbuminemia 09/20/2018   Hypoglycemia without diagnosis of diabetes mellitus 09/20/2018   Epidural abscess 09/20/2018   Hyperkalemia 05/18/2018   Edentulous 05/11/2018   Pancytopenia ([name]) 05/10/2018   CAD (coronary artery disease) 05/10/2018   Hepatic encephalopathy ([name]) 05/10/2018   Alcohol  abuse 05/10/2018   GERD (gastroesophageal reflux disease) 05/10/2018   Duodenal ulcer    Renal failure    Acute on chronic anemia    Hypotension 06/17/2017   Hyponatremia 06/17/2017   Leg edema, right 06/17/2017   Acute metabolic encephalopathy 06/17/2017   Anemia, iron  deficiency    Benign neoplasm of ascending colon    Hemorrhoids    Portal hypertensive gastropathy ([name])    Gastritis and gastroduodenitis    Esophageal varices without bleeding ([name])    H/O: CVA (cerebrovascular accident) 12/01/2013   ACS (acute coronary syndrome) ([name])  12/01/2013   Anasarca 12/01/2013   Polysubstance abuse ([name]) 12/01/2013   CKD (chronic kidney disease) stage 3, GFR 30-59 ml/min ([name]) 12/01/2013   S/P right TKA 05/02/2013   Murmur 04/14/2013   Right inguinal hernia 12/26/2010   Cirrhosis, alcoholic ([name]) 12/20/2010    PCP: Mercer Clotilda SAUNDERS, MD  REFERRING PROVIDER: Harden Jerona GAILS, MD  ONSET DATE: 02/08/2024 MD referral to PT  REFERRING DIAG: Z89.611 (ICD-10-CM) - Hx of AKA (above knee amputation), right   THERAPY DIAG:  Other abnormalities of gait and mobility  Repeated falls  Unsteadiness on feet  Muscle weakness (generalized)  Stiffness of right hip, not elsewhere classified  Rationale for Evaluation and Treatment: Rehabilitation  SUBJECTIVE:   SUBJECTIVE STATEMENT: Patient arrived with new loftstrand crutches, but has not been able to see the prosthetist for prosthesis length.   PERTINENT HISTORY: right TFA 11/07/22, inguinal hernia repair BLEs, ETOH abuse, cirrhosis, arthritis, GERD, HTN, HLD, heart murmur, MI, TIA 2011, systemic inflammatory response syndrome   Patient underwent a right Transfemoral Amputation on 11/07/2022 due to infected TKA. Initial right TKA 2015 with antibiotic spacers 2021 &  2022 with I&D. He got prosthesis on 02/10/2023. He had got new socket in April 2025.  The prosthesis slides off some times.  He is wearing prosthesis daily most of awake hours except if he is staying home.     PAIN:  NPRS scale: not rated this date  Pain location: right residual limb Pain description: soreness Aggravating factors: unknown Relieving factors: it goes away on its own  PRECAUTIONS: Fall  WEIGHT BEARING RESTRICTIONS: No  FALLS:  Has patient fallen in last 6 months? Yes. Number of falls >4 x/month. He denies injuries.  If prosthesis twist and catches prosthesis going up steps.   LIVING ENVIRONMENT: Lives with: lives with their spouse and dog 60#  Lives in: House Home Access: Stairs to enter Home  layout: One level Stairs: Yes: External: 3 steps to porch without rails and threshold into house Has following equipment at home: Single point cane, Environmental Consultant - 2 wheeled, Chief Operating Officer, Wheelchair (manual), and Tour manager  OCCUPATION:  retired does odd jobs lobbyist, estate manager/land agent, cut wood   PLOF: Independent  PATIENT GOALS:   He wants to walk   Next MD visit:  OBJECTIVE:   PATIENT SURVEYS:  Patient-Specific Activity Scoring Scheme  0 represents unable to perform. 10 represents able to perform at prior level. 0 1 2 3 4 5 6 7 8 9  10 (Date and Score)  Activity Eval     1.   Standing ADLs   8    2.  Walking with prosthesis  4    3.  Repair cars, yard work, chop wood 6   4.    5.    Score 6    Total score = sum of the activity scores/number of activities Minimum detectable change (90%CI) for average score = 2 points Minimum detectable change (90%CI) for single activity score = 3 points  COGNITION: Overall cognitive status: WFL    SENSATION: WFL  MUSCLE LENGTH: Evaluation on 02/29/2024: Debby test: Right -25 deg;  POSTURE:  Evaluation on 02/29/2024: rounded shoulders, forward head, flexed trunk , and weight shift left  LOWER EXTREMITY ROM:   ROM Left eval  Hip flexion   Hip extension -12*  Hip abduction   Hip adduction   Hip internal rotation   Hip external rotation   Knee flexion   Knee extension   Ankle dorsiflexion   Ankle plantarflexion   Ankle inversion   Ankle eversion    (Blank rows = not tested)  LOWER EXTREMITY MMT:  MMT Left eval  Hip flexion   Hip extension 4/5  Hip abduction 4/5  Hip adduction   Hip internal rotation   Hip external rotation   Knee flexion   Knee extension   Ankle dorsiflexion   Ankle plantarflexion   Ankle inversion   Ankle eversion    (Blank rows = not tested)  TRANSFERS: Evaluation on 02/29/2024: Sit to stand: SBA from 18 chair with armrest uses back of legs against chair to stabilize and  requires external support of crutches to position lower extremities for stabilization. Stand to sit: SBA requires use of armrest to control descent to 18 chair  FUNCTIONAL TESTs:  03/30/2024: Patient able to reach anteriorly 7 and look over shoulders without upper extremity support with supervision safely.  Evaluation on 02/29/2024: Lars Balance 20/56  [facility] PT Assessment - 02/29/24 1300       Standardized Balance Assessment   Standardized Balance Assessment Berg Balance Test      [facility] Balance Test  Sit to Stand Needs minimal aid to stand or to stabilize    Standing Unsupported Able to stand 2 minutes with supervision    Sitting with Back Unsupported but Feet Supported on Floor or Stool Able to sit safely and securely 2 minutes    Stand to Sit Controls descent by using hands    Transfers Able to transfer safely, definite need of hands    Standing Unsupported with Eyes Closed Unable to keep eyes closed 3 seconds but stays steady    Standing Unsupported with Feet Together Needs help to attain position but able to stand for 30 seconds with feet together    From Standing, Reach Forward with Outstretched Arm Reaches forward but needs supervision    From Standing Position, Pick up Object from Floor Unable to pick up and needs supervision    From Standing Position, Turn to Look Behind Over each Shoulder Needs supervision when turning    Turn 360 Degrees Needs assistance while turning    Standing Unsupported, Alternately Place Feet on Step/Stool Needs assistance to keep from falling or unable to try    Standing Unsupported, One Foot in [employer] balance while stepping or standing    Standing on One Leg Tries to lift leg/unable to hold 3 seconds but remains standing independently    Total Score 20    Berg comment: BERG  < 36 high risk for falls (close to 100%) 46-51 moderate (>50%)   37-45 significant (>80%) 52-55 lower (> 25%)         GAIT: 03/30/2024: Patient ambulates 200 feet with  forearm crutches 4-point gait pattern with prosthetic knee flexion approximately 50% of the time with supervision/PT cues. Patient negotiates ramp and curb with forearm crutches with PT supervision/verbal cues on technique.  Evaluation on 02/29/2024: Gait pattern: step to pattern, decreased step length- Left, decreased stance time- Right, decreased hip/knee flexion- Right, circumduction- Right, Right hip hike, antalgic, trunk flexed, and abducted- Right patient is fearful to unlock his prosthesis for swing phase. Pistoning noted with prosthesis due to suspension issue. Distance walked: 120' Assistive device utilized: Crutches and transfemoral prosthesis Level of assistance: SBA only when using crutches.  He would require greater assistance if he was using a cane or no device.  Gait velocity: 1.37 ft/sec Comments: Excessive weightbearing on axillary crutches  CURRENT PROSTHETIC WEAR ASSESSMENT: 03/30/2024: Patient reports wearing prosthesis greater than 12 hours/day with limb pain less than 5/10. Patient verbalizes proper donning of prosthesis.  Evaluation on 02/29/2024: Patient is independent with: skin check Patient is dependent with: prosthetic cleaning, correct ply sock adjustment, and proper wear schedule/adjustment Donning prosthesis: SBA patient needs cues for new suspension system with suction ring Doffing prosthesis: Modified independence Prosthetic wear tolerance: Patient reports wearing prosthesis most of awake hours on days that he is leaving his home.  On days that he stays home he reports not wearing the prosthesis at all. Prosthetic weight bearing tolerance: 10 minutes with no increase in residual limb pain reported Edema: None Residual limb condition: No open areas, cylindrical shape, normal temperature and moisture level.  Prosthetic description: Silicone liner with suction ring suspension, ischial containment socket with flexible inner socket.  Multiaxial knee.    TODAY'S  TREATMENT:  DATE:  05/05/2024: Prosthetic Care with transfemoral prosthesis: PT discussed importance of reaching back out to prosthetist in order to fix leg length discrepancy with Rt prosthesis.  PT educated patient on appropriate height of lofstrand crutches and how they can affect appropriate posture with ambulation. PT adjusted and locked lofstrand crutches to appropriate height.  PT discussed importance of performing HEP at home everyday, especially hip stretch, posture, and balance activities. Patient acknowledged and agreed.  Patient ambulated with bilat lofstrand crutches and supervision for 1x129', 1x99' requiring several cues for forward gaze, upright posture, and Rt knee flexion with terminal stance. Patient having difficulty with dual tasking and verbal cues which led to incorrect 4-point gait pattern, though patient able to return to appropriate gait pattern and form after standing still and verbalizing the process.   Neuromuscular reeducation: Patient performed standing balance exercises with green TB, CGA, and 1 inch lift placed under Rt prosthesis. 1x12 rows with each UE individually and then 1x12 with bilat UE, then patient performing 1x12 flexion punches with each UE individually then 1x5 with bilat UE. Patient requiring lateral weight shift onto Rt LE by PT as well as tactile cues to chest and glutes to improve posture intermittently throughout. Patient able to hold appropriate form following PT release of patient for at least 5 seconds before returning to increased trunk flexion and Lt weight shift.     TREATMENT:                                                                                                                             DATE:  03/30/2024: Prosthetic Care with transfemoral prosthesis: Patient ambulates 200 feet with forearm crutches 4-point gait  pattern with prosthetic knee flexion approximately 50% of the time with supervision/PT cues. Patient negotiates ramp and curb with forearm crutches with PT supervision/verbal cues on technique. PT assessed height of prosthesis which appears to be approximately 1 inch too short.  Neuromuscular reeducation: Standing balance (1 lift under prosthetic foot) with counter support posterior and chair back anterior for safety: Performing Zoom ball for 2 minutes with patient able to perform approximately 3 consecutive reps before having to touch chair for balance/stability. Active back extension flexing forward placing hands in seat of a chair under control and returning to upright position with active back extension 10 reps.  PT supervision for balance and safety including maintaining equal weightbearing through bilateral lower extremities.    TREATMENT:  DATE:  03/16/2024: Prosthetic Care with transfemoral prosthesis: PT reviewed how tight hip flexors can cause prosthetic knee flexion earlier than he wants in gait cycle.  Some muscle soreness from new activities or use of muscle is normal.  Best thing is to continue with exercises.  Pt verbalized understanding.  Pt sit to/from stand chair with armrests to forearm crutches with verbal cues / SBA.  Pt amb 40' and 120' with forearm crutches with 3-point pattern with PT tactile & verbal cues on sequence, step length, initial contact with heel / extending prosthetic knee, weight shift over prosthesis and trying to stand upright as much as possible.  Pt neg 11 steps with single rail & 1 forearm crutch with PT cues on technique and prosthetic knee ext for stance.  Switched side of rail half way so he understands how to negotiate with either rail available.    Manual Therapy:  Contract-relax and PT giving pressure to increase stretch.  Hip  flexor stretch to facilitate upright posture.  Hooklying with RLE with prosthesis over edge 30 sec hold 3 reps.    Therapeutic Activities: Posterior pelvic tilt 5 sec hold 10 reps 2 sets with tactile & verbal cues.  Facilitating pelvic control for standing & gait activities.  Side lying RLE leg press manually (pelvic depression and hip extension) 10 reps.     TREATMENT:                                                                                                                             DATE:  03/14/2024: Prosthetic Care with transfemoral prosthesis: PT verbal & demo cues on step length with heel passing contralateral toes, weight shift over prosthesis in stance and upright posture.  Pt amb 75' X 2 with axillary crutches with supervision.  Pt amb 75' with forearm crutches with supervision.  PT cues on carryover of above.  Pt to try to get forearm crutches for Christmas present from family.    Therapeutic Activities:   Standing with back to counter working on upright posture with ears over shoulders over hips over feet.   Back extension standing with pelvis ~ step in front of counter so he can catch himself if falling back and chair in front facing him.  Under control lean forward placing hands in chair seat then stand up straight for 5 sec.  10 reps with supervision.  Hip flexor stretch to facilitate upright posture.  Hooklying with RLE with prosthesis over edge 30 sec hold 3 reps.  PT giving light pressure to increase stretch. PT gave handout with verbal cues on above 3 exercises as HEP.      HOME EXERCISE PROGRAM: Access Code: [email or web address] URL: [email or web address] Date: 03/14/2024 Prepared by: Grayce Spatz  Exercises - Standing posture with back to counter  - 1-3 x daily - 7 x weekly - 1 sets - 1 reps - 2-5 minutes hold - standing back extension  - 1-3 x daily - 7  x weekly - 1 sets - 10 reps - 5 seconds hold - Modified Thomas Stretch  - 1-3 x daily - 7 x weekly - 1  sets - 3 reps - 30 seconds hold   ASSESSMENT:  CLINICAL IMPRESSION: Patient arrived to session ambulating with personal bilat lofstrand crutches and ambulating with appropriate 4-point gait pattern. Patient continues to have deficits with appropriate Rt weight shift, upright posture, and knee flexion with swing phase. Patient educated on importance of performing HEP for stretch, balance, and posture.  Patient will continue to benefit from skilled PT.  OBJECTIVE IMPAIRMENTS: Abnormal gait, decreased activity tolerance, decreased balance, decreased endurance, decreased knowledge of condition, decreased knowledge of use of DME, decreased mobility, difficulty walking, decreased ROM, decreased strength, postural dysfunction, prosthetic dependency , and pain.   ACTIVITY LIMITATIONS: carrying, lifting, sitting, standing, stairs, transfers, and locomotion level  PARTICIPATION LIMITATIONS: meal prep, cleaning, and community activity  PERSONAL FACTORS: Age, Past/current experiences, Time since onset of injury/illness/exacerbation, and 3+ comorbidities: see PMH are also affecting patient's functional outcome.   REHAB POTENTIAL: Good  CLINICAL DECISION MAKING: Evolving/moderate complexity  EVALUATION COMPLEXITY: Moderate   GOALS: Goals reviewed with patient? Yes  SHORT TERM GOALS: Target date: 03/30/2024  Patient donnes prosthesis modified independent correctly Baseline: SEE OBJECTIVE DATA Goal status: MET 03/30/2024 2.  Patient tolerates prosthesis wear daily for >12 hours without skin issues or limb pain over 5/10 Baseline: SEE OBJECTIVE DATA Goal status:  MET 03/30/2024  3.  Patient able to reach 7 and look over both shoulders without UE support with supervision. Baseline: SEE OBJECTIVE DATA Goal status:  MET 03/30/2024  4. Patient ambulates 200' with cruthes & prosthesis with supervision with prosthetic knee flexion >50% of steps for swing phase. Baseline: SEE OBJECTIVE DATA Goal  status:    MET 03/30/2024  5. Patient negotiates ramps & curbs with crutches & prosthesis with supervision. Baseline: SEE OBJECTIVE DATA Goal status:  MET 03/30/2024  LONG TERM GOALS: Target date: 05/27/2023  Patient demonstrates & verbalized understanding of prosthetic care to enable safe utilization of prosthesis. Baseline: SEE OBJECTIVE DATA Goal status: Ongoing    05/03/2024  Patient tolerates prosthesis wear >90% of awake hours without skin or limb pain >2/10 issues. Baseline: SEE OBJECTIVE DATA Goal status: Ongoing    05/03/2024  Berg Balance >36/56 to indicate lower fall risk Baseline: SEE OBJECTIVE DATA Goal status: Ongoing    05/03/2024  Patient ambulates >250' with prosthesis with cane or less independently Baseline: SEE OBJECTIVE DATA Goal status: Ongoing   05/03/2024  Patient negotiates ramps, curbs & stairs with single rail with prosthesis  and cane or less independently. Baseline: SEE OBJECTIVE DATA Goal status: Ongoing    05/03/2024  8.  Patient reports Patient-Specific Activity Score improved to average 8 to indicate improvement in functional activities.   Baseline: SEE OBJECTIVE DATA Goal status: Ongoing  05/03/2024  PLAN:  PT FREQUENCY: 2x/week  PT DURATION: 12 weeks  PLANNED INTERVENTIONS: 97164- PT Re-evaluation, 97750- Physical Performance Testing, 97110-Therapeutic exercises, 97530- Therapeutic activity, 97112- Neuromuscular re-education, [phone]- Self Care, [id number]- Gait training, [phone]- Prosthetic Initial , [phone]- Orthotic/Prosthetic subsequent, Patient/Family education, Balance training, and Stair training  PLAN FOR NEXT SESSION:   continue with prosthetic gait with forearm crutches working on terminal stance & swing prosthetic knee flexion and weight shift over prosthesis in stance and neg ramps / curbs, postural strengthening/flexibility, balance activities   Susannah Daring, PT, DPT 05/05/2024 3:59 PM     Date of referral: 02/08/2024 Referring provider: Jerona  Harden Referring diagnosis? Z89.611 (ICD-10-CM) - Hx of AKA (above knee amputation), right  Treatment diagnosis? (if different than referring diagnosis) R26.89, R29.6, R26.81, M62.81, M25.651  What was this (referring dx) caused by? Surgery (Type: right AKA) and Felton Hawks of Condition: Recurrent (multiple episodes of < 3 months)   Laterality: Rt  Current Functional Measure Score:  Berg Balance 20/56  Objective measurements identify impairments when they are compared to normal values, the uninvolved extremity, and prior level of function.  [x]  Yes  []  No  Objective assessment of functional ability: Moderate functional limitations   Briefly describe symptoms:  patient is dependent in prosthetic care.  Patient has recurrent falls and Berg Balance 20/56 indicates high fall risk.  Patient has dependent prosthetic gait with deviations indicating high fall risk.    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  He is dependent in use especially donning suction ring suspension correctly.   Average pain intensity:  Last 24 hours: 7/10  Past week: 7/10  How often does the pt experience symptoms? Frequently  How much have the symptoms interfered with usual daily activities? Quite a bit  How has condition changed since care began at this facility? NA - initial visit  In general, how is the patients overall health? Good   BACK PAIN (STarT Back Screening Tool) No  "

## 2024-05-10 ENCOUNTER — Encounter: Payer: Self-pay | Admitting: Physical Therapy

## 2024-05-10 ENCOUNTER — Ambulatory Visit: Admitting: Physical Therapy

## 2024-05-10 DIAGNOSIS — M6281 Muscle weakness (generalized): Secondary | ICD-10-CM | POA: Diagnosis not present

## 2024-05-10 DIAGNOSIS — M25651 Stiffness of right hip, not elsewhere classified: Secondary | ICD-10-CM | POA: Diagnosis not present

## 2024-05-10 DIAGNOSIS — R2681 Unsteadiness on feet: Secondary | ICD-10-CM | POA: Diagnosis not present

## 2024-05-10 DIAGNOSIS — R2689 Other abnormalities of gait and mobility: Secondary | ICD-10-CM | POA: Diagnosis not present

## 2024-05-10 DIAGNOSIS — R296 Repeated falls: Secondary | ICD-10-CM

## 2024-05-10 NOTE — Therapy (Signed)
 "  OUTPATIENT PHYSICAL THERAPY PROSTHETIC TREATMENT   Patient Name: Mark [name]. MRN: [id number] DOB:[birth year], 69 y.o., male Today's Date: 05/10/2024  END OF SESSION:  PT End of Session - 05/10/24 1356     Visit Number 7    Number of Visits 25    Date for Recertification  05/26/24    Authorization Type [employer] Medicare & [employer] state health    Progress Note Due on Visit 10    PT Start Time 1354    PT Stop Time 1433    PT Time Calculation (min) 39 min    Equipment Utilized During Treatment Gait belt    Activity Tolerance Patient tolerated treatment well    Behavior During Therapy WFL for tasks assessed/performed           Past Medical History:  Diagnosis Date   Alcohol  abuse    Anemia    Arthritis    Ascites    Cirrhosis ([name])    Coffee ground emesis    Dehydration 06/17/2017   Febrile illness    GERD (gastroesophageal reflux disease)    Heart murmur    History of blood transfusion    Hyperlipidemia    Hypertension    Leg swelling    Myocardial infarction ([name]) 2012   Preop cardiovascular exam 04/14/2013   Sepsis ([name]) 06/17/2017   Septic shock ([name]) 06/18/2017   SIRS (systemic inflammatory response syndrome) ([name]) 07/11/2017   Stroke ([name]) 10/2009   TIA   Thrombocytopenia    Past Surgical History:  Procedure Laterality Date   AMPUTATION Right 11/07/2022   Procedure: RIGHT ABOVE KNEE AMPUTATION;  Surgeon: Harden Jerona GAILS, MD;  Location: [facility] OR;  Service: Orthopedics;  Laterality: Right;   COLONOSCOPY WITH PROPOFOL  N/A 02/07/2016   Procedure: COLONOSCOPY WITH PROPOFOL ;  Surgeon: Toribio SHAUNNA Cedar, MD;  Location: [facility];  Service: Endoscopy;  Laterality: N/A;   ERCP N/A 02/28/2023   Procedure: ENDOSCOPIC RETROGRADE CHOLANGIOPANCREATOGRAPHY (ERCP);  Surgeon: Rollin Dover, MD;  Location: [facility];  Service: Gastroenterology;  Laterality: N/A;   ESOPHAGOGASTRODUODENOSCOPY (EGD) WITH PROPOFOL  N/A 02/07/2016   Procedure: ESOPHAGOGASTRODUODENOSCOPY (EGD)  WITH PROPOFOL ;  Surgeon: Toribio SHAUNNA Cedar, MD;  Location: [facility];  Service: Endoscopy;  Laterality: N/A;   ESOPHAGOGASTRODUODENOSCOPY (EGD) WITH PROPOFOL  N/A 06/22/2017   Procedure: ESOPHAGOGASTRODUODENOSCOPY (EGD) WITH PROPOFOL ;  Surgeon: Albertus Gordy HERO, MD;  Location: [facility];  Service: Gastroenterology;  Laterality: N/A;   EXCISIONAL TOTAL KNEE ARTHROPLASTY WITH ANTIBIOTIC SPACERS Right 03/20/2020   Procedure: Resection right total knee arthroplasty and placement of antibiotic spacer;  Surgeon: Ernie Cough, MD;  Location: [facility];  Service: Orthopedics;  Laterality: Right;  90 mins   HERNIA REPAIR Right    inguinal   INGUINAL HERNIA REPAIR Left 02/08/2021   Procedure: OPEN LEFT INGUINAL HERNIA REPAIR WITH MESH;  Surgeon: Vernetta Berg, MD;  Location: [facility] OR;  Service: General;  Laterality: Left;   IRRIGATION AND DEBRIDEMENT KNEE Right 11/13/2020   Procedure: IRRIGATION AND DEBRIDEMENT KNEE;  Surgeon: Ernie Cough, MD;  Location: [facility];  Service: Orthopedics;  Laterality: Right;   JOINT REPLACEMENT     KNEE ARTHROSCOPY     bilateral/  12/14   REIMPLANTATION OF TOTAL KNEE Right 08/02/2020   Procedure: REIMPLANTATION/REVISION OF TOTAL KNEE WITH REMOVAL OF ANTIBIOTIC SPACER;  Surgeon: Ernie Cough, MD;  Location: [facility];  Service: Orthopedics;  Laterality: Right;    REMOVAL OF STONES  02/28/2023   Procedure: REMOVAL OF DEBRIS;  Surgeon: Rollin Dover, MD;  Location: [facility];  Service: Gastroenterology;;   ANNETT  02/28/2023   Procedure: ANNETT;  Surgeon: Rollin Dover, MD;  Location: [facility];  Service: Gastroenterology;;   TEE WITHOUT CARDIOVERSION N/A 05/13/2018   Procedure: TRANSESOPHAGEAL ECHOCARDIOGRAM (TEE);  Surgeon: Loni Soyla LABOR, MD;  Location: [facility]OSCOPY;  Service: Cardiovascular;  Laterality: N/A;   TOTAL KNEE ARTHROPLASTY Right 05/02/2013   Procedure: RIGHT TOTAL KNEE ARTHROPLASTY;  Surgeon: Donnice JONETTA Car, MD;  Location: [facility];   Service: Orthopedics;  Laterality: Right;   Patient Active Problem List   Diagnosis Date Noted   Acute blood loss anemia 03/02/2023   Choledocholithiasis 02/28/2023   HTN (hypertension) 02/28/2023   Hypokalemia 02/28/2023   Cholelithiasis with biliary obstruction 02/27/2023   Infection of total knee replacement 11/07/2022   Hx of AKA (above knee amputation), right ([name]) 11/07/2022   Cerebrovascular disease 07/08/2022   Renal cyst 07/08/2022   Protein-calorie malnutrition, severe 07/08/2022   Toxic metabolic encephalopathy 02/26/2022   Septic joint of right knee joint ([name]) 11/08/2020   S/P right TKA reimplantation 08/02/2020   Infection of total left knee replacement 03/20/2020   Cirrhosis of liver without ascites ([name])    AKI (acute kidney injury)    Bacteremia    Spinal stenosis of lumbar region without neurogenic claudication    Aspiration pneumonia ([name]) 09/24/2018   Abscess in epidural space of lumbar spine    MRSA bacteremia 09/21/2018   Infection of prosthetic right knee joint 09/20/2018   Anemia of chronic disease 09/20/2018   Hypoalbuminemia 09/20/2018   Hypoglycemia without diagnosis of diabetes mellitus 09/20/2018   Epidural abscess 09/20/2018   Hyperkalemia 05/18/2018   Edentulous 05/11/2018   Pancytopenia ([name]) 05/10/2018   CAD (coronary artery disease) 05/10/2018   Hepatic encephalopathy ([name]) 05/10/2018   Alcohol  abuse 05/10/2018   GERD (gastroesophageal reflux disease) 05/10/2018   Duodenal ulcer    Renal failure    Acute on chronic anemia    Hypotension 06/17/2017   Hyponatremia 06/17/2017   Leg edema, right 06/17/2017   Acute metabolic encephalopathy 06/17/2017   Anemia, iron  deficiency    Benign neoplasm of ascending colon    Hemorrhoids    Portal hypertensive gastropathy ([name])    Gastritis and gastroduodenitis    Esophageal varices without bleeding ([name])    H/O: CVA (cerebrovascular accident) 12/01/2013   ACS (acute coronary syndrome) ([name])  12/01/2013   Anasarca 12/01/2013   Polysubstance abuse ([name]) 12/01/2013   CKD (chronic kidney disease) stage 3, GFR 30-59 ml/min ([name]) 12/01/2013   S/P right TKA 05/02/2013   Murmur 04/14/2013   Right inguinal hernia 12/26/2010   Cirrhosis, alcoholic ([name]) 12/20/2010    PCP: Mercer Clotilda SAUNDERS, MD  REFERRING PROVIDER: Harden Jerona GAILS, MD  ONSET DATE: 02/08/2024 MD referral to PT  REFERRING DIAG: Z89.611 (ICD-10-CM) - Hx of AKA (above knee amputation), right   THERAPY DIAG:  Other abnormalities of gait and mobility  Repeated falls  Unsteadiness on feet  Muscle weakness (generalized)  Stiffness of right hip, not elsewhere classified  Rationale for Evaluation and Treatment: Rehabilitation  SUBJECTIVE:   SUBJECTIVE STATEMENT: He is wearing daily for most of awake hours. He has a little rash on his limb.  He called prosthetist and is working on getting appt to adjust height.    PERTINENT HISTORY: right TFA 11/07/22, inguinal hernia repair BLEs, ETOH abuse, cirrhosis, arthritis, GERD, HTN, HLD, heart murmur, MI, TIA 2011, systemic inflammatory response syndrome   Patient underwent a right Transfemoral Amputation on 11/07/2022  due to infected TKA. Initial right TKA 2015 with antibiotic spacers 2021 & 2022 with I&D. He got prosthesis on 02/10/2023. He had got new socket in April 2025.  The prosthesis slides off some times.  He is wearing prosthesis daily most of awake hours except if he is staying home.     PAIN:  NPRS scale: not rated this date  Pain location: right residual limb Pain description: soreness Aggravating factors: unknown Relieving factors: it goes away on its own  PRECAUTIONS: Fall  WEIGHT BEARING RESTRICTIONS: No  FALLS:  Has patient fallen in last 6 months? Yes. Number of falls >4 x/month. He denies injuries.  If prosthesis twist and catches prosthesis going up steps.   LIVING ENVIRONMENT: Lives with: lives with their spouse and dog 60#  Lives in:  House Home Access: Stairs to enter Home layout: One level Stairs: Yes: External: 3 steps to porch without rails and threshold into house Has following equipment at home: Single point cane, Environmental Consultant - 2 wheeled, Chief Operating Officer, Wheelchair (manual), and Tour manager  OCCUPATION:  retired does odd jobs lobbyist, estate manager/land agent, cut wood   PLOF: Independent  PATIENT GOALS:   He wants to walk   Next MD visit:  OBJECTIVE:   PATIENT SURVEYS:  Patient-Specific Activity Scoring Scheme  0 represents unable to perform. 10 represents able to perform at prior level. 0 1 2 3 4 5 6 7 8 9  10 (Date and Score)  Activity Eval     1.   Standing ADLs   8    2.  Walking with prosthesis  4    3.  Repair cars, yard work, chop wood 6   4.    5.    Score 6    Total score = sum of the activity scores/number of activities Minimum detectable change (90%CI) for average score = 2 points Minimum detectable change (90%CI) for single activity score = 3 points  COGNITION: Overall cognitive status: WFL    SENSATION: WFL  MUSCLE LENGTH: Evaluation on 02/29/2024: Debby test: Right -25 deg;  POSTURE:  Evaluation on 02/29/2024: rounded shoulders, forward head, flexed trunk , and weight shift left  LOWER EXTREMITY ROM:   ROM Left eval  Hip flexion   Hip extension -12*  Hip abduction   Hip adduction   Hip internal rotation   Hip external rotation   Knee flexion   Knee extension   Ankle dorsiflexion   Ankle plantarflexion   Ankle inversion   Ankle eversion    (Blank rows = not tested)  LOWER EXTREMITY MMT:  MMT Left eval  Hip flexion   Hip extension 4/5  Hip abduction 4/5  Hip adduction   Hip internal rotation   Hip external rotation   Knee flexion   Knee extension   Ankle dorsiflexion   Ankle plantarflexion   Ankle inversion   Ankle eversion    (Blank rows = not tested)  TRANSFERS: Evaluation on 02/29/2024: Sit to stand: SBA from 18 chair with armrest uses back of  legs against chair to stabilize and requires external support of crutches to position lower extremities for stabilization. Stand to sit: SBA requires use of armrest to control descent to 18 chair  FUNCTIONAL TESTs:  03/30/2024: Patient able to reach anteriorly 7 and look over shoulders without upper extremity support with supervision safely.  Evaluation on 02/29/2024: Lars Balance 20/56  [facility] PT Assessment - 02/29/24 1300       Standardized Balance Assessment   Standardized Balance  Assessment Berg Balance Test      Berg Balance Test   Sit to Stand Needs minimal aid to stand or to stabilize    Standing Unsupported Able to stand 2 minutes with supervision    Sitting with Back Unsupported but Feet Supported on Floor or Stool Able to sit safely and securely 2 minutes    Stand to Sit Controls descent by using hands    Transfers Able to transfer safely, definite need of hands    Standing Unsupported with Eyes Closed Unable to keep eyes closed 3 seconds but stays steady    Standing Unsupported with Feet Together Needs help to attain position but able to stand for 30 seconds with feet together    From Standing, Reach Forward with Outstretched Arm Reaches forward but needs supervision    From Standing Position, Pick up Object from Floor Unable to pick up and needs supervision    From Standing Position, Turn to Look Behind Over each Shoulder Needs supervision when turning    Turn 360 Degrees Needs assistance while turning    Standing Unsupported, Alternately Place Feet on Step/Stool Needs assistance to keep from falling or unable to try    Standing Unsupported, One Foot in [employer] balance while stepping or standing    Standing on One Leg Tries to lift leg/unable to hold 3 seconds but remains standing independently    Total Score 20    Berg comment: BERG  < 36 high risk for falls (close to 100%) 46-51 moderate (>50%)   37-45 significant (>80%) 52-55 lower (> 25%)          GAIT: 03/30/2024: Patient ambulates 200 feet with forearm crutches 4-point gait pattern with prosthetic knee flexion approximately 50% of the time with supervision/PT cues. Patient negotiates ramp and curb with forearm crutches with PT supervision/verbal cues on technique.  Evaluation on 02/29/2024: Gait pattern: step to pattern, decreased step length- Left, decreased stance time- Right, decreased hip/knee flexion- Right, circumduction- Right, Right hip hike, antalgic, trunk flexed, and abducted- Right patient is fearful to unlock his prosthesis for swing phase. Pistoning noted with prosthesis due to suspension issue. Distance walked: 120' Assistive device utilized: Crutches and transfemoral prosthesis Level of assistance: SBA only when using crutches.  He would require greater assistance if he was using a cane or no device.  Gait velocity: 1.37 ft/sec Comments: Excessive weightbearing on axillary crutches  CURRENT PROSTHETIC WEAR ASSESSMENT: 03/30/2024: Patient reports wearing prosthesis greater than 12 hours/day with limb pain less than 5/10. Patient verbalizes proper donning of prosthesis.  Evaluation on 02/29/2024: Patient is independent with: skin check Patient is dependent with: prosthetic cleaning, correct ply sock adjustment, and proper wear schedule/adjustment Donning prosthesis: SBA patient needs cues for new suspension system with suction ring Doffing prosthesis: Modified independence Prosthetic wear tolerance: Patient reports wearing prosthesis most of awake hours on days that he is leaving his home.  On days that he stays home he reports not wearing the prosthesis at all. Prosthetic weight bearing tolerance: 10 minutes with no increase in residual limb pain reported Edema: None Residual limb condition: No open areas, cylindrical shape, normal temperature and moisture level.  Prosthetic description: Silicone liner with suction ring suspension, ischial containment socket with  flexible inner socket.  Multiaxial knee.    TODAY'S TREATMENT:  DATE:  05/10/2024: Prosthetic Care with transfemoral prosthesis: Clean the liner every night with soap and water  and rinse the soap off Nightly wash your leg thoroughly and apply lotion. Before removing prothesis grab a warm towel and use this to rub the itch on your leg instead of nails  Every morning use a warm wash cloth to wipe off any residual lotion off of your leg ; can apply hydrocortisone to isolated areas that are itching but not whole leg Can use 1/2 alcohol  1/2 water  spray or hand sanitizer to lubricate ring to slide down into the socket better.   The sock should be tucked up under the ring BUT NOT crossing the ring.  Feel around back of ring to make sure that it is not flipped.  After you have slid down into socket. Pick up left leg to put weight on prosthesis which will settle your limb into socket better.  You can press button on prosthesis to let air out.  Fold the top of the sock over the outside of prosthesis to keep them from sliding into the prosthesis.  PT printed above HO for pt information. He verbalized understanding.   PT demo & verbal cues on upright posture with walking.  You can glance at floor to check placement of prosthetic foot, but then you look to forward where you are going.  DO NOT STARE at the floor.  Pt amb 100' with forearm crutches with PT cues for carryover.     TREATMENT:                                                                                                                             DATE:  05/05/2024: Prosthetic Care with transfemoral prosthesis: PT discussed importance of reaching back out to prosthetist in order to fix leg length discrepancy with Rt prosthesis.  PT educated patient on appropriate height of lofstrand crutches and how they can affect  appropriate posture with ambulation. PT adjusted and locked lofstrand crutches to appropriate height.  PT discussed importance of performing HEP at home everyday, especially hip stretch, posture, and balance activities. Patient acknowledged and agreed.  Patient ambulated with bilat lofstrand crutches and supervision for 1x129', 1x99' requiring several cues for forward gaze, upright posture, and Rt knee flexion with terminal stance. Patient having difficulty with dual tasking and verbal cues which led to incorrect 4-point gait pattern, though patient able to return to appropriate gait pattern and form after standing still and verbalizing the process.   Neuromuscular reeducation: Patient performed standing balance exercises with green TB, CGA, and 1 inch lift placed under Rt prosthesis. 1x12 rows with each UE individually and then 1x12 with bilat UE, then patient performing 1x12 flexion punches with each UE individually then 1x5 with bilat UE. Patient requiring lateral weight shift onto Rt LE by PT as well as tactile cues to chest and glutes to improve posture intermittently throughout. Patient able to hold appropriate form following PT release  of patient for at least 5 seconds before returning to increased trunk flexion and Lt weight shift.     TREATMENT:                                                                                                                             DATE:  03/30/2024: Prosthetic Care with transfemoral prosthesis: Patient ambulates 200 feet with forearm crutches 4-point gait pattern with prosthetic knee flexion approximately 50% of the time with supervision/PT cues. Patient negotiates ramp and curb with forearm crutches with PT supervision/verbal cues on technique. PT assessed height of prosthesis which appears to be approximately 1 inch too short.  Neuromuscular reeducation: Standing balance (1 lift under prosthetic foot) with counter support posterior and chair back  anterior for safety: Performing Zoom ball for 2 minutes with patient able to perform approximately 3 consecutive reps before having to touch chair for balance/stability. Active back extension flexing forward placing hands in seat of a chair under control and returning to upright position with active back extension 10 reps.  PT supervision for balance and safety including maintaining equal weightbearing through bilateral lower extremities.    TREATMENT:                                                                                                                             DATE:  03/16/2024: Prosthetic Care with transfemoral prosthesis: PT reviewed how tight hip flexors can cause prosthetic knee flexion earlier than he wants in gait cycle.  Some muscle soreness from new activities or use of muscle is normal.  Best thing is to continue with exercises.  Pt verbalized understanding.  Pt sit to/from stand chair with armrests to forearm crutches with verbal cues / SBA.  Pt amb 40' and 120' with forearm crutches with 3-point pattern with PT tactile & verbal cues on sequence, step length, initial contact with heel / extending prosthetic knee, weight shift over prosthesis and trying to stand upright as much as possible.  Pt neg 11 steps with single rail & 1 forearm crutch with PT cues on technique and prosthetic knee ext for stance.  Switched side of rail half way so he understands how to negotiate with either rail available.    Manual Therapy:  Contract-relax and PT giving pressure to increase stretch.  Hip flexor stretch to facilitate upright posture.  Hooklying with RLE with prosthesis over edge 30 sec hold 3 reps.  Therapeutic Activities: Posterior pelvic tilt 5 sec hold 10 reps 2 sets with tactile & verbal cues.  Facilitating pelvic control for standing & gait activities.  Side lying RLE leg press manually (pelvic depression and hip extension) 10 reps.     TREATMENT:                                                                                                                              DATE:  03/14/2024: Prosthetic Care with transfemoral prosthesis: PT verbal & demo cues on step length with heel passing contralateral toes, weight shift over prosthesis in stance and upright posture.  Pt amb 75' X 2 with axillary crutches with supervision.  Pt amb 75' with forearm crutches with supervision.  PT cues on carryover of above.  Pt to try to get forearm crutches for Christmas present from family.    Therapeutic Activities:   Standing with back to counter working on upright posture with ears over shoulders over hips over feet.   Back extension standing with pelvis ~ step in front of counter so he can catch himself if falling back and chair in front facing him.  Under control lean forward placing hands in chair seat then stand up straight for 5 sec.  10 reps with supervision.  Hip flexor stretch to facilitate upright posture.  Hooklying with RLE with prosthesis over edge 30 sec hold 3 reps.  PT giving light pressure to increase stretch. PT gave handout with verbal cues on above 3 exercises as HEP.      HOME EXERCISE PROGRAM: Access Code: [email or web address] URL: [email or web address] Date: 03/14/2024 Prepared by: Grayce Spatz  Exercises - Standing posture with back to counter  - 1-3 x daily - 7 x weekly - 1 sets - 1 reps - 2-5 minutes hold - standing back extension  - 1-3 x daily - 7 x weekly - 1 sets - 10 reps - 5 seconds hold - Modified Thomas Stretch  - 1-3 x daily - 7 x weekly - 1 sets - 3 reps - 30 seconds hold   ASSESSMENT:  CLINICAL IMPRESSION: Patient appears to understand updated prosthetic care instructions. His gait improved with PT cues to look forward instead of staring at floor which improved his posture.   Patient will continue to benefit from skilled PT.  OBJECTIVE IMPAIRMENTS: Abnormal gait, decreased activity tolerance, decreased balance, decreased endurance,  decreased knowledge of condition, decreased knowledge of use of DME, decreased mobility, difficulty walking, decreased ROM, decreased strength, postural dysfunction, prosthetic dependency , and pain.   ACTIVITY LIMITATIONS: carrying, lifting, sitting, standing, stairs, transfers, and locomotion level  PARTICIPATION LIMITATIONS: meal prep, cleaning, and community activity  PERSONAL FACTORS: Age, Past/current experiences, Time since onset of injury/illness/exacerbation, and 3+ comorbidities: see PMH are also affecting patient's functional outcome.   REHAB POTENTIAL: Good  CLINICAL DECISION MAKING: Evolving/moderate complexity  EVALUATION COMPLEXITY: Moderate   GOALS: Goals reviewed with patient? Yes  SHORT TERM GOALS: Target date:  03/30/2024  Patient donnes prosthesis modified independent correctly Baseline: SEE OBJECTIVE DATA Goal status: MET 03/30/2024 2.  Patient tolerates prosthesis wear daily for >12 hours without skin issues or limb pain over 5/10 Baseline: SEE OBJECTIVE DATA Goal status:  MET 03/30/2024  3.  Patient able to reach 7 and look over both shoulders without UE support with supervision. Baseline: SEE OBJECTIVE DATA Goal status:  MET 03/30/2024  4. Patient ambulates 200' with cruthes & prosthesis with supervision with prosthetic knee flexion >50% of steps for swing phase. Baseline: SEE OBJECTIVE DATA Goal status:    MET 03/30/2024  5. Patient negotiates ramps & curbs with crutches & prosthesis with supervision. Baseline: SEE OBJECTIVE DATA Goal status:  MET 03/30/2024  LONG TERM GOALS: Target date: 05/27/2023  Patient demonstrates & verbalized understanding of prosthetic care to enable safe utilization of prosthesis. Baseline: SEE OBJECTIVE DATA Goal status: Ongoing    05/03/2024  Patient tolerates prosthesis wear >90% of awake hours without skin or limb pain >2/10 issues. Baseline: SEE OBJECTIVE DATA Goal status: Ongoing    05/03/2024  Berg Balance >36/56 to  indicate lower fall risk Baseline: SEE OBJECTIVE DATA Goal status: Ongoing    05/03/2024  Patient ambulates >250' with prosthesis with cane or less independently Baseline: SEE OBJECTIVE DATA Goal status: Ongoing   05/03/2024  Patient negotiates ramps, curbs & stairs with single rail with prosthesis  and cane or less independently. Baseline: SEE OBJECTIVE DATA Goal status: Ongoing    05/03/2024  8.  Patient reports Patient-Specific Activity Score improved to average 8 to indicate improvement in functional activities.   Baseline: SEE OBJECTIVE DATA Goal status: Ongoing  05/03/2024  PLAN:  PT FREQUENCY: 2x/week  PT DURATION: 12 weeks  PLANNED INTERVENTIONS: [id number]- PT Re-evaluation, 97750- Physical Performance Testing, 97110-Therapeutic exercises, 97530- Therapeutic activity, 97112- Neuromuscular re-education, [id number]- Self Care, [id number]- Gait training, [phone]- Prosthetic Initial , [phone]- Orthotic/Prosthetic subsequent, Patient/Family education, Balance training, and Stair training  PLAN FOR NEXT SESSION:   continue with prosthetic gait with forearm crutches working on terminal stance & swing prosthetic knee flexion and weight shift over prosthesis in stance and neg ramps / curbs, postural strengthening/flexibility, balance activities   Grayce Spatz, PT, DPT 05/10/2024, 3:57 PM     Date of referral: 02/08/2024 Referring provider: Jerona Sage Referring diagnosis? Z89.611 (ICD-10-CM) - Hx of AKA (above knee amputation), right  Treatment diagnosis? (if different than referring diagnosis) R26.89, R29.6, R26.81, M62.81, M25.651  What was this (referring dx) caused by? Surgery (Type: right AKA) and [place] Hawks of Condition: Recurrent (multiple episodes of < 3 months)   Laterality: Rt  Current Functional Measure Score:  Berg Balance 20/56  Objective measurements identify impairments when they are compared to normal values, the uninvolved extremity, and prior level of function.  [x]  Yes  []   No  Objective assessment of functional ability: Moderate functional limitations   Briefly describe symptoms:  patient is dependent in prosthetic care.  Patient has recurrent falls and Berg Balance 20/56 indicates high fall risk.  Patient has dependent prosthetic gait with deviations indicating high fall risk.    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  He is dependent in use especially donning suction ring suspension correctly.   Average pain intensity:  Last 24 hours: 7/10  Past week: 7/10  How often does the pt experience symptoms? Frequently  How much have the symptoms interfered with usual daily activities? Quite a bit  How has  condition changed since care began at this facility? NA - initial visit  In general, how is the patients overall health? Good   BACK PAIN (STarT Back Screening Tool) No  "

## 2024-05-10 NOTE — Patient Instructions (Signed)
 Clean the liner every night with soap and water  and rinse the soap off Nightly wash your leg thoroughly and apply lotion. Before removing prothesis grab a warm towel and use this to rub the itch on your leg instead of nails  Every morning use a warm wash cloth to wipe off any residual lotion off of your leg ; can apply hydrocortisone to isolated areas that are itching but not whole leg Can use 1/2 alcohol  1/2 water  spray or hand sanitizer to lubricate ring to slide down into the socket better.   The sock should be tucked up under the ring BUT NOT crossing the ring.  Feel around back of ring to make sure that it is not flipped.  After you have slid down into socket. Pick up left leg to put weight on prosthesis which will settle your limb into socket better.  You can press button on prosthesis to let air out.  Fold the top of the sock over the outside of prosthesis to keep them from sliding into the prosthesis.

## 2024-05-11 NOTE — Therapy (Signed)
 "  OUTPATIENT PHYSICAL THERAPY PROSTHETIC TREATMENT   Patient Name: Mark [name]. MRN: [id number] DOB:23-Jan-1956, 69 y.o., male Today's Date: 05/12/2024  END OF SESSION:  PT End of Session - 05/12/24 1436     Visit Number 8    Number of Visits 25    Date for Recertification  05/26/24    Authorization Type [employer] Medicare & [employer] state health    Progress Note Due on Visit 10    PT Start Time 1436    PT Stop Time 1515    PT Time Calculation (min) 39 min    Equipment Utilized During Treatment Gait belt    Activity Tolerance Patient tolerated treatment well    Behavior During Therapy WFL for tasks assessed/performed            Past Medical History:  Diagnosis Date   Alcohol  abuse    Anemia    Arthritis    Ascites    Cirrhosis ([name])    Coffee ground emesis    Dehydration 06/17/2017   Febrile illness    GERD (gastroesophageal reflux disease)    Heart murmur    History of blood transfusion    Hyperlipidemia    Hypertension    Leg swelling    Myocardial infarction ([name]) 2012   Preop cardiovascular exam 04/14/2013   Sepsis ([name]) 06/17/2017   Septic shock ([name]) 06/18/2017   SIRS (systemic inflammatory response syndrome) ([name]) 07/11/2017   Stroke ([name]) 10/2009   TIA   Thrombocytopenia    Past Surgical History:  Procedure Laterality Date   AMPUTATION Right 11/07/2022   Procedure: RIGHT ABOVE KNEE AMPUTATION;  Surgeon: Harden Jerona GAILS, MD;  Location: [facility] OR;  Service: Orthopedics;  Laterality: Right;   COLONOSCOPY WITH PROPOFOL  N/A 02/07/2016   Procedure: COLONOSCOPY WITH PROPOFOL ;  Surgeon: Toribio SHAUNNA Cedar, MD;  Location: [facility];  Service: Endoscopy;  Laterality: N/A;   ERCP N/A 02/28/2023   Procedure: ENDOSCOPIC RETROGRADE CHOLANGIOPANCREATOGRAPHY (ERCP);  Surgeon: Rollin Dover, MD;  Location: [facility];  Service: Gastroenterology;  Laterality: N/A;   ESOPHAGOGASTRODUODENOSCOPY (EGD) WITH PROPOFOL  N/A 02/07/2016   Procedure: ESOPHAGOGASTRODUODENOSCOPY (EGD)  WITH PROPOFOL ;  Surgeon: Toribio SHAUNNA Cedar, MD;  Location: [facility];  Service: Endoscopy;  Laterality: N/A;   ESOPHAGOGASTRODUODENOSCOPY (EGD) WITH PROPOFOL  N/A 06/22/2017   Procedure: ESOPHAGOGASTRODUODENOSCOPY (EGD) WITH PROPOFOL ;  Surgeon: Albertus Gordy HERO, MD;  Location: [facility] ENDOSCOPY;  Service: Gastroenterology;  Laterality: N/A;   EXCISIONAL TOTAL KNEE ARTHROPLASTY WITH ANTIBIOTIC SPACERS Right 03/20/2020   Procedure: Resection right total knee arthroplasty and placement of antibiotic spacer;  Surgeon: Ernie Cough, MD;  Location: [facility];  Service: Orthopedics;  Laterality: Right;  90 mins   HERNIA REPAIR Right    inguinal   INGUINAL HERNIA REPAIR Left 02/08/2021   Procedure: OPEN LEFT INGUINAL HERNIA REPAIR WITH MESH;  Surgeon: Vernetta Berg, MD;  Location: [facility] OR;  Service: General;  Laterality: Left;   IRRIGATION AND DEBRIDEMENT KNEE Right 11/13/2020   Procedure: IRRIGATION AND DEBRIDEMENT KNEE;  Surgeon: Ernie Cough, MD;  Location: [facility];  Service: Orthopedics;  Laterality: Right;   JOINT REPLACEMENT     KNEE ARTHROSCOPY     bilateral/  12/14   REIMPLANTATION OF TOTAL KNEE Right 08/02/2020   Procedure: REIMPLANTATION/REVISION OF TOTAL KNEE WITH REMOVAL OF ANTIBIOTIC SPACER;  Surgeon: Ernie Cough, MD;  Location: [facility];  Service: Orthopedics;  Laterality: Right;    REMOVAL OF STONES  02/28/2023   Procedure: REMOVAL OF DEBRIS;  Surgeon: Rollin Dover, MD;  Location: [facility];  Service: Gastroenterology;;   ANNETT  02/28/2023   Procedure: ANNETT;  Surgeon: Rollin Dover, MD;  Location: [facility];  Service: Gastroenterology;;   TEE WITHOUT CARDIOVERSION N/A 05/13/2018   Procedure: TRANSESOPHAGEAL ECHOCARDIOGRAM (TEE);  Surgeon: Loni Soyla LABOR, MD;  Location: [facility] ENDOSCOPY;  Service: Cardiovascular;  Laterality: N/A;   TOTAL KNEE ARTHROPLASTY Right 05/02/2013   Procedure: RIGHT TOTAL KNEE ARTHROPLASTY;  Surgeon: Donnice JONETTA Car, MD;  Location: [facility];   Service: Orthopedics;  Laterality: Right;   Patient Active Problem List   Diagnosis Date Noted   Acute blood loss anemia 03/02/2023   Choledocholithiasis 02/28/2023   HTN (hypertension) 02/28/2023   Hypokalemia 02/28/2023   Cholelithiasis with biliary obstruction 02/27/2023   Infection of total knee replacement 11/07/2022   Hx of AKA (above knee amputation), right ([name]) 11/07/2022   Cerebrovascular disease 07/08/2022   Renal cyst 07/08/2022   Protein-calorie malnutrition, severe 07/08/2022   Toxic metabolic encephalopathy 02/26/2022   Septic joint of right knee joint ([name]) 11/08/2020   S/P right TKA reimplantation 08/02/2020   Infection of total left knee replacement 03/20/2020   Cirrhosis of liver without ascites ([name])    AKI (acute kidney injury)    Bacteremia    Spinal stenosis of lumbar region without neurogenic claudication    Aspiration pneumonia ([name]) 09/24/2018   Abscess in epidural space of lumbar spine    MRSA bacteremia 09/21/2018   Infection of prosthetic right knee joint 09/20/2018   Anemia of chronic disease 09/20/2018   Hypoalbuminemia 09/20/2018   Hypoglycemia without diagnosis of diabetes mellitus 09/20/2018   Epidural abscess 09/20/2018   Hyperkalemia 05/18/2018   Edentulous 05/11/2018   Pancytopenia ([name]) 05/10/2018   CAD (coronary artery disease) 05/10/2018   Hepatic encephalopathy ([name]) 05/10/2018   Alcohol  abuse 05/10/2018   GERD (gastroesophageal reflux disease) 05/10/2018   Duodenal ulcer    Renal failure    Acute on chronic anemia    Hypotension 06/17/2017   Hyponatremia 06/17/2017   Leg edema, right 06/17/2017   Acute metabolic encephalopathy 06/17/2017   Anemia, iron  deficiency    Benign neoplasm of ascending colon    Hemorrhoids    Portal hypertensive gastropathy ([name])    Gastritis and gastroduodenitis    Esophageal varices without bleeding ([name])    H/O: CVA (cerebrovascular accident) 12/01/2013   ACS (acute coronary syndrome) ([name])  12/01/2013   Anasarca 12/01/2013   Polysubstance abuse ([name]) 12/01/2013   CKD (chronic kidney disease) stage 3, GFR 30-59 ml/min ([name]) 12/01/2013   S/P right TKA 05/02/2013   Murmur 04/14/2013   Right inguinal hernia 12/26/2010   Cirrhosis, alcoholic ([name]) 12/20/2010    PCP: Mercer Clotilda SAUNDERS, MD  REFERRING PROVIDER: Harden Jerona GAILS, MD  ONSET DATE: 02/08/2024 MD referral to PT  REFERRING DIAG: Z89.611 (ICD-10-CM) - Hx of AKA (above knee amputation), right   THERAPY DIAG:  Other abnormalities of gait and mobility  Repeated falls  Unsteadiness on feet  Muscle weakness (generalized)  Stiffness of right hip, not elsewhere classified  Rationale for Evaluation and Treatment: Rehabilitation  SUBJECTIVE:   SUBJECTIVE STATEMENT: Patient reports that he is not having pain that is no more than normal. Patient also reports that he has not talked with the prosthetist and plans on stopping by the office following his session to try and schedule an appointment with them to address Rt prosthesis length.  PERTINENT HISTORY: right TFA 11/07/22, inguinal hernia repair BLEs, ETOH abuse, cirrhosis, arthritis, GERD, HTN, HLD, heart murmur, MI,  TIA 2011, systemic inflammatory response syndrome   Patient underwent a right Transfemoral Amputation on 11/07/2022 due to infected TKA. Initial right TKA 2015 with antibiotic spacers 2021 & 2022 with I&D. He got prosthesis on 02/10/2023. He had got new socket in April 2025.  The prosthesis slides off some times.  He is wearing prosthesis daily most of awake hours except if he is staying home.     PAIN:  NPRS scale: not rated this date  Pain location: right residual limb Pain description: soreness Aggravating factors: unknown Relieving factors: it goes away on its own  PRECAUTIONS: Fall  WEIGHT BEARING RESTRICTIONS: No  FALLS:  Has patient fallen in last 6 months? Yes. Number of falls >4 x/month. He denies injuries.  If prosthesis twist and  catches prosthesis going up steps.   LIVING ENVIRONMENT: Lives with: lives with their spouse and dog 60#  Lives in: House Home Access: Stairs to enter Home layout: One level Stairs: Yes: External: 3 steps to porch without rails and threshold into house Has following equipment at home: Single point cane, Environmental Consultant - 2 wheeled, Chief Operating Officer, Wheelchair (manual), and Tour manager  OCCUPATION:  retired does odd jobs lobbyist, estate manager/land agent, cut wood   PLOF: Independent  PATIENT GOALS:   He wants to walk   Next MD visit:  OBJECTIVE:   PATIENT SURVEYS:  Patient-Specific Activity Scoring Scheme  0 represents unable to perform. 10 represents able to perform at prior level. 0 1 2 3 4 5 6 7 8 9  10 (Date and Score)  Activity Eval     1.   Standing ADLs   8    2.  Walking with prosthesis  4    3.  Repair cars, yard work, chop wood 6   4.    5.    Score 6    Total score = sum of the activity scores/number of activities Minimum detectable change (90%CI) for average score = 2 points Minimum detectable change (90%CI) for single activity score = 3 points  COGNITION: Overall cognitive status: WFL    SENSATION: WFL  MUSCLE LENGTH: Evaluation on 02/29/2024: Debby test: Right -25 deg;  POSTURE:  Evaluation on 02/29/2024: rounded shoulders, forward head, flexed trunk , and weight shift left  LOWER EXTREMITY ROM:   ROM Left eval  Hip flexion   Hip extension -12*  Hip abduction   Hip adduction   Hip internal rotation   Hip external rotation   Knee flexion   Knee extension   Ankle dorsiflexion   Ankle plantarflexion   Ankle inversion   Ankle eversion    (Blank rows = not tested)  LOWER EXTREMITY MMT:  MMT Left eval  Hip flexion   Hip extension 4/5  Hip abduction 4/5  Hip adduction   Hip internal rotation   Hip external rotation   Knee flexion   Knee extension   Ankle dorsiflexion   Ankle plantarflexion   Ankle inversion   Ankle eversion     (Blank rows = not tested)  TRANSFERS: Evaluation on 02/29/2024: Sit to stand: SBA from 18 chair with armrest uses back of legs against chair to stabilize and requires external support of crutches to position lower extremities for stabilization. Stand to sit: SBA requires use of armrest to control descent to 18 chair  FUNCTIONAL TESTs:  03/30/2024: Patient able to reach anteriorly 7 and look over shoulders without upper extremity support with supervision safely.  Evaluation on 02/29/2024: Berg Balance 20/56  [facility] PT Assessment -  02/29/24 1300       Standardized Balance Assessment   Standardized Balance Assessment Berg Balance Test      Berg Balance Test   Sit to Stand Needs minimal aid to stand or to stabilize    Standing Unsupported Able to stand 2 minutes with supervision    Sitting with Back Unsupported but Feet Supported on Floor or Stool Able to sit safely and securely 2 minutes    Stand to Sit Controls descent by using hands    Transfers Able to transfer safely, definite need of hands    Standing Unsupported with Eyes Closed Unable to keep eyes closed 3 seconds but stays steady    Standing Unsupported with Feet Together Needs help to attain position but able to stand for 30 seconds with feet together    From Standing, Reach Forward with Outstretched Arm Reaches forward but needs supervision    From Standing Position, Pick up Object from Floor Unable to pick up and needs supervision    From Standing Position, Turn to Look Behind Over each Shoulder Needs supervision when turning    Turn 360 Degrees Needs assistance while turning    Standing Unsupported, Alternately Place Feet on Step/Stool Needs assistance to keep from falling or unable to try    Standing Unsupported, One Foot in [employer] balance while stepping or standing    Standing on One Leg Tries to lift leg/unable to hold 3 seconds but remains standing independently    Total Score 20    Berg comment: BERG  < 36 high risk  for falls (close to 100%) 46-51 moderate (>50%)   37-45 significant (>80%) 52-55 lower (> 25%)         GAIT: 03/30/2024: Patient ambulates 200 feet with forearm crutches 4-point gait pattern with prosthetic knee flexion approximately 50% of the time with supervision/PT cues. Patient negotiates ramp and curb with forearm crutches with PT supervision/verbal cues on technique.  Evaluation on 02/29/2024: Gait pattern: step to pattern, decreased step length- Left, decreased stance time- Right, decreased hip/knee flexion- Right, circumduction- Right, Right hip hike, antalgic, trunk flexed, and abducted- Right patient is fearful to unlock his prosthesis for swing phase. Pistoning noted with prosthesis due to suspension issue. Distance walked: 120' Assistive device utilized: Crutches and transfemoral prosthesis Level of assistance: SBA only when using crutches.  He would require greater assistance if he was using a cane or no device.  Gait velocity: 1.37 ft/sec Comments: Excessive weightbearing on axillary crutches  CURRENT PROSTHETIC WEAR ASSESSMENT: 03/30/2024: Patient reports wearing prosthesis greater than 12 hours/day with limb pain less than 5/10. Patient verbalizes proper donning of prosthesis.  Evaluation on 02/29/2024: Patient is independent with: skin check Patient is dependent with: prosthetic cleaning, correct ply sock adjustment, and proper wear schedule/adjustment Donning prosthesis: SBA patient needs cues for new suspension system with suction ring Doffing prosthesis: Modified independence Prosthetic wear tolerance: Patient reports wearing prosthesis most of awake hours on days that he is leaving his home.  On days that he stays home he reports not wearing the prosthesis at all. Prosthetic weight bearing tolerance: 10 minutes with no increase in residual limb pain reported Edema: None Residual limb condition: No open areas, cylindrical shape, normal temperature and moisture level.   Prosthetic description: Silicone liner with suction ring suspension, ischial containment socket with flexible inner socket.  Multiaxial knee.    TODAY'S TREATMENT:  DATE:  05/12/2024: Prosthetic Care with transfemoral prosthesis: Patient ambulated 3x125' with bilat lofstrand crutches and SUP>SBA with verbal cues from PT. Patient ambulated using 4-point technique, though PT attempted to transition to 2-point technique with bilat lofstrand crutches. Patient unable to appropriately perform throughout session without consistent verbal cues, several standing breaks to discuss technique, and seated rest breaks between in order to verbalize technique without dual tasking. Though patient had difficulty with new ambulation technique, patient was able to increase time with forward gaze and intermittent Rt knee flexion with terminal stance. PT discussed importance of visualizing and verbalizing the 2-point technique while at home in order to increase brain-body connection and carryover into performance.   TODAY'S TREATMENT:                                                                                                                             DATE:  05/10/2024: Prosthetic Care with transfemoral prosthesis: Clean the liner every night with soap and water  and rinse the soap off Nightly wash your leg thoroughly and apply lotion. Before removing prothesis grab a warm towel and use this to rub the itch on your leg instead of nails  Every morning use a warm wash cloth to wipe off any residual lotion off of your leg ; can apply hydrocortisone to isolated areas that are itching but not whole leg Can use 1/2 alcohol  1/2 water  spray or hand sanitizer to lubricate ring to slide down into the socket better.   The sock should be tucked up under the ring BUT NOT crossing the ring.  Feel around back  of ring to make sure that it is not flipped.  After you have slid down into socket. Pick up left leg to put weight on prosthesis which will settle your limb into socket better.  You can press button on prosthesis to let air out.  Fold the top of the sock over the outside of prosthesis to keep them from sliding into the prosthesis.  PT printed above HO for pt information. He verbalized understanding.   PT demo & verbal cues on upright posture with walking.  You can glance at floor to check placement of prosthetic foot, but then you look to forward where you are going.  DO NOT STARE at the floor.  Pt amb 100' with forearm crutches with PT cues for carryover.     TREATMENT:  DATE:  05/05/2024: Prosthetic Care with transfemoral prosthesis: PT discussed importance of reaching back out to prosthetist in order to fix leg length discrepancy with Rt prosthesis.  PT educated patient on appropriate height of lofstrand crutches and how they can affect appropriate posture with ambulation. PT adjusted and locked lofstrand crutches to appropriate height.  PT discussed importance of performing HEP at home everyday, especially hip stretch, posture, and balance activities. Patient acknowledged and agreed.  Patient ambulated with bilat lofstrand crutches and supervision for 1x129', 1x99' requiring several cues for forward gaze, upright posture, and Rt knee flexion with terminal stance. Patient having difficulty with dual tasking and verbal cues which led to incorrect 4-point gait pattern, though patient able to return to appropriate gait pattern and form after standing still and verbalizing the process.   Neuromuscular reeducation: Patient performed standing balance exercises with green TB, CGA, and 1 inch lift placed under Rt prosthesis. 1x12 rows with each UE individually and then 1x12 with bilat  UE, then patient performing 1x12 flexion punches with each UE individually then 1x5 with bilat UE. Patient requiring lateral weight shift onto Rt LE by PT as well as tactile cues to chest and glutes to improve posture intermittently throughout. Patient able to hold appropriate form following PT release of patient for at least 5 seconds before returning to increased trunk flexion and Lt weight shift.     TREATMENT:                                                                                                                             DATE:  03/30/2024: Prosthetic Care with transfemoral prosthesis: Patient ambulates 200 feet with forearm crutches 4-point gait pattern with prosthetic knee flexion approximately 50% of the time with supervision/PT cues. Patient negotiates ramp and curb with forearm crutches with PT supervision/verbal cues on technique. PT assessed height of prosthesis which appears to be approximately 1 inch too short.  Neuromuscular reeducation: Standing balance (1 lift under prosthetic foot) with counter support posterior and chair back anterior for safety: Performing Zoom ball for 2 minutes with patient able to perform approximately 3 consecutive reps before having to touch chair for balance/stability. Active back extension flexing forward placing hands in seat of a chair under control and returning to upright position with active back extension 10 reps.  PT supervision for balance and safety including maintaining equal weightbearing through bilateral lower extremities.    TREATMENT:  DATE:  03/16/2024: Prosthetic Care with transfemoral prosthesis: PT reviewed how tight hip flexors can cause prosthetic knee flexion earlier than he wants in gait cycle.  Some muscle soreness from new activities or use of muscle is normal.  Best thing is to continue with  exercises.  Pt verbalized understanding.  Pt sit to/from stand chair with armrests to forearm crutches with verbal cues / SBA.  Pt amb 40' and 120' with forearm crutches with 3-point pattern with PT tactile & verbal cues on sequence, step length, initial contact with heel / extending prosthetic knee, weight shift over prosthesis and trying to stand upright as much as possible.  Pt neg 11 steps with single rail & 1 forearm crutch with PT cues on technique and prosthetic knee ext for stance.  Switched side of rail half way so he understands how to negotiate with either rail available.    Manual Therapy:  Contract-relax and PT giving pressure to increase stretch.  Hip flexor stretch to facilitate upright posture.  Hooklying with RLE with prosthesis over edge 30 sec hold 3 reps.    Therapeutic Activities: Posterior pelvic tilt 5 sec hold 10 reps 2 sets with tactile & verbal cues.  Facilitating pelvic control for standing & gait activities.  Side lying RLE leg press manually (pelvic depression and hip extension) 10 reps.      HOME EXERCISE PROGRAM: Access Code: [email or web address] URL: [email or web address] Date: 03/14/2024 Prepared by: Grayce Spatz  Exercises - Standing posture with back to counter  - 1-3 x daily - 7 x weekly - 1 sets - 1 reps - 2-5 minutes hold - standing back extension  - 1-3 x daily - 7 x weekly - 1 sets - 10 reps - 5 seconds hold - Modified Thomas Stretch  - 1-3 x daily - 7 x weekly - 1 sets - 3 reps - 30 seconds hold   ASSESSMENT:  CLINICAL IMPRESSION: Patient arrived to session noting no new changes and continuing to increase time performing HEP activities. Patient ambulated well with 4-point technique increasing his time performing appropriate gait mechanics, though continues to have deficits with increased trunk flexion. Patient intermittently able to perform 2-point technique with difficulty, though returned to 4-point often. Patient will continue to benefit from  skilled PT.  OBJECTIVE IMPAIRMENTS: Abnormal gait, decreased activity tolerance, decreased balance, decreased endurance, decreased knowledge of condition, decreased knowledge of use of DME, decreased mobility, difficulty walking, decreased ROM, decreased strength, postural dysfunction, prosthetic dependency , and pain.   ACTIVITY LIMITATIONS: carrying, lifting, sitting, standing, stairs, transfers, and locomotion level  PARTICIPATION LIMITATIONS: meal prep, cleaning, and community activity  PERSONAL FACTORS: Age, Past/current experiences, Time since onset of injury/illness/exacerbation, and 3+ comorbidities: see PMH are also affecting patient's functional outcome.   REHAB POTENTIAL: Good  CLINICAL DECISION MAKING: Evolving/moderate complexity  EVALUATION COMPLEXITY: Moderate   GOALS: Goals reviewed with patient? Yes  SHORT TERM GOALS: Target date: 03/30/2024  Patient donnes prosthesis modified independent correctly Baseline: SEE OBJECTIVE DATA Goal status: MET 03/30/2024 2.  Patient tolerates prosthesis wear daily for >12 hours without skin issues or limb pain over 5/10 Baseline: SEE OBJECTIVE DATA Goal status:  MET 03/30/2024  3.  Patient able to reach 7 and look over both shoulders without UE support with supervision. Baseline: SEE OBJECTIVE DATA Goal status:  MET 03/30/2024  4. Patient ambulates 200' with cruthes & prosthesis with supervision with prosthetic knee flexion >50% of steps for swing phase. Baseline: SEE OBJECTIVE DATA Goal status:    MET  03/30/2024  5. Patient negotiates ramps & curbs with crutches & prosthesis with supervision. Baseline: SEE OBJECTIVE DATA Goal status:  MET 03/30/2024  LONG TERM GOALS: Target date: 05/27/2023  Patient demonstrates & verbalized understanding of prosthetic care to enable safe utilization of prosthesis. Baseline: SEE OBJECTIVE DATA Goal status: Ongoing    05/03/2024  Patient tolerates prosthesis wear >90% of awake hours without  skin or limb pain >2/10 issues. Baseline: SEE OBJECTIVE DATA Goal status: Ongoing    05/03/2024  Berg Balance >36/56 to indicate lower fall risk Baseline: SEE OBJECTIVE DATA Goal status: Ongoing    05/03/2024  Patient ambulates >250' with prosthesis with cane or less independently Baseline: SEE OBJECTIVE DATA Goal status: Ongoing   05/03/2024  Patient negotiates ramps, curbs & stairs with single rail with prosthesis  and cane or less independently. Baseline: SEE OBJECTIVE DATA Goal status: Ongoing    05/03/2024  8.  Patient reports Patient-Specific Activity Score improved to average 8 to indicate improvement in functional activities.   Baseline: SEE OBJECTIVE DATA Goal status: Ongoing  05/03/2024  PLAN:  PT FREQUENCY: 2x/week  PT DURATION: 12 weeks  PLANNED INTERVENTIONS: 97164- PT Re-evaluation, 97750- Physical Performance Testing, 97110-Therapeutic exercises, 97530- Therapeutic activity, 97112- Neuromuscular re-education, [phone]- Self Care, [id number]- Gait training, [phone]- Prosthetic Initial , [phone]- Orthotic/Prosthetic subsequent, Patient/Family education, Balance training, and Stair training  PLAN FOR NEXT SESSION:    continue with prosthetic gait with forearm crutches working on terminal stance & swing prosthetic knee flexion and weight shift over prosthesis in stance and neg ramps / curbs, postural strengthening/flexibility, balance activities   Susannah Daring, PT, DPT 05/12/24 3:40 PM       Date of referral: 02/08/2024 Referring provider: Jerona Sage Referring diagnosis? Z89.611 (ICD-10-CM) - Hx of AKA (above knee amputation), right  Treatment diagnosis? (if different than referring diagnosis) R26.89, R29.6, R26.81, M62.81, M25.651  What was this (referring dx) caused by? Surgery (Type: right AKA) and [place] of Condition: Recurrent (multiple episodes of < 3 months)   Laterality: Rt  Current Functional Measure Score:  Berg Balance 20/56  Objective measurements identify  impairments when they are compared to normal values, the uninvolved extremity, and prior level of function.  [x]  Yes  []  No  Objective assessment of functional ability: Moderate functional limitations   Briefly describe symptoms:  patient is dependent in prosthetic care.  Patient has recurrent falls and Berg Balance 20/56 indicates high fall risk.  Patient has dependent prosthetic gait with deviations indicating high fall risk.    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  He is dependent in use especially donning suction ring suspension correctly.   Average pain intensity:  Last 24 hours: 7/10  Past week: 7/10  How often does the pt experience symptoms? Frequently  How much have the symptoms interfered with usual daily activities? Quite a bit  How has condition changed since care began at this facility? NA - initial visit  In general, how is the patients overall health? Good   BACK PAIN (STarT Back Screening Tool) No  "

## 2024-05-12 ENCOUNTER — Ambulatory Visit

## 2024-05-12 DIAGNOSIS — M6281 Muscle weakness (generalized): Secondary | ICD-10-CM | POA: Diagnosis not present

## 2024-05-12 DIAGNOSIS — R296 Repeated falls: Secondary | ICD-10-CM | POA: Diagnosis not present

## 2024-05-12 DIAGNOSIS — R2689 Other abnormalities of gait and mobility: Secondary | ICD-10-CM

## 2024-05-12 DIAGNOSIS — M25651 Stiffness of right hip, not elsewhere classified: Secondary | ICD-10-CM

## 2024-05-12 DIAGNOSIS — R2681 Unsteadiness on feet: Secondary | ICD-10-CM

## 2024-05-17 ENCOUNTER — Encounter: Payer: Self-pay | Admitting: Physical Therapy

## 2024-05-17 ENCOUNTER — Ambulatory Visit: Admitting: Physical Therapy

## 2024-05-17 DIAGNOSIS — R2689 Other abnormalities of gait and mobility: Secondary | ICD-10-CM

## 2024-05-17 DIAGNOSIS — R2681 Unsteadiness on feet: Secondary | ICD-10-CM

## 2024-05-17 DIAGNOSIS — R296 Repeated falls: Secondary | ICD-10-CM | POA: Diagnosis not present

## 2024-05-17 DIAGNOSIS — M6281 Muscle weakness (generalized): Secondary | ICD-10-CM

## 2024-05-17 DIAGNOSIS — M25651 Stiffness of right hip, not elsewhere classified: Secondary | ICD-10-CM

## 2024-05-17 NOTE — Therapy (Signed)
 "  OUTPATIENT PHYSICAL THERAPY PROSTHETIC TREATMENT   Patient Name: Mark [name]. MRN: [id number] DOB:[birth year], 69 y.o., male Today's Date: 05/17/2024  END OF SESSION:  PT End of Session - 05/17/24 1438     Visit Number 9    Number of Visits 25    Date for Recertification  05/26/24    Authorization Type [employer] Medicare & [employer] state health    Progress Note Due on Visit 10    PT Start Time 1438    PT Stop Time 1517    PT Time Calculation (min) 39 min    Equipment Utilized During Treatment Gait belt    Activity Tolerance Patient tolerated treatment well    Behavior During Therapy WFL for tasks assessed/performed             Past Medical History:  Diagnosis Date   Alcohol  abuse    Anemia    Arthritis    Ascites    Cirrhosis ([name])    Coffee ground emesis    Dehydration 06/17/2017   Febrile illness    GERD (gastroesophageal reflux disease)    Heart murmur    History of blood transfusion    Hyperlipidemia    Hypertension    Leg swelling    Myocardial infarction ([name]) 2012   Preop cardiovascular exam 04/14/2013   Sepsis ([name]) 06/17/2017   Septic shock ([name]) 06/18/2017   SIRS (systemic inflammatory response syndrome) ([name]) 07/11/2017   Stroke ([name]) 10/2009   TIA   Thrombocytopenia    Past Surgical History:  Procedure Laterality Date   AMPUTATION Right 11/07/2022   Procedure: RIGHT ABOVE KNEE AMPUTATION;  Surgeon: Harden Jerona GAILS, MD;  Location: [facility] OR;  Service: Orthopedics;  Laterality: Right;   COLONOSCOPY WITH PROPOFOL  N/A 02/07/2016   Procedure: COLONOSCOPY WITH PROPOFOL ;  Surgeon: Toribio SHAUNNA Cedar, MD;  Location: [facility];  Service: Endoscopy;  Laterality: N/A;   ERCP N/A 02/28/2023   Procedure: ENDOSCOPIC RETROGRADE CHOLANGIOPANCREATOGRAPHY (ERCP);  Surgeon: Rollin Dover, MD;  Location: [facility];  Service: Gastroenterology;  Laterality: N/A;   ESOPHAGOGASTRODUODENOSCOPY (EGD) WITH PROPOFOL  N/A 02/07/2016   Procedure: ESOPHAGOGASTRODUODENOSCOPY  (EGD) WITH PROPOFOL ;  Surgeon: Toribio SHAUNNA Cedar, MD;  Location: [facility];  Service: Endoscopy;  Laterality: N/A;   ESOPHAGOGASTRODUODENOSCOPY (EGD) WITH PROPOFOL  N/A 06/22/2017   Procedure: ESOPHAGOGASTRODUODENOSCOPY (EGD) WITH PROPOFOL ;  Surgeon: Albertus Gordy HERO, MD;  Location: [facility];  Service: Gastroenterology;  Laterality: N/A;   EXCISIONAL TOTAL KNEE ARTHROPLASTY WITH ANTIBIOTIC SPACERS Right 03/20/2020   Procedure: Resection right total knee arthroplasty and placement of antibiotic spacer;  Surgeon: Ernie Cough, MD;  Location: [facility];  Service: Orthopedics;  Laterality: Right;  90 mins   HERNIA REPAIR Right    inguinal   INGUINAL HERNIA REPAIR Left 02/08/2021   Procedure: OPEN LEFT INGUINAL HERNIA REPAIR WITH MESH;  Surgeon: Vernetta Berg, MD;  Location: [facility] OR;  Service: General;  Laterality: Left;   IRRIGATION AND DEBRIDEMENT KNEE Right 11/13/2020   Procedure: IRRIGATION AND DEBRIDEMENT KNEE;  Surgeon: Ernie Cough, MD;  Location: [facility];  Service: Orthopedics;  Laterality: Right;   JOINT REPLACEMENT     KNEE ARTHROSCOPY     bilateral/  12/14   REIMPLANTATION OF TOTAL KNEE Right 08/02/2020   Procedure: REIMPLANTATION/REVISION OF TOTAL KNEE WITH REMOVAL OF ANTIBIOTIC SPACER;  Surgeon: Ernie Cough, MD;  Location: [facility];  Service: Orthopedics;  Laterality: Right;    REMOVAL OF STONES  02/28/2023   Procedure: REMOVAL OF DEBRIS;  Surgeon: Rollin Dover,  MD;  Location: [facility];  Service: Gastroenterology;;   ANNETT  02/28/2023   Procedure: ANNETT;  Surgeon: Rollin Dover, MD;  Location: [facility];  Service: Gastroenterology;;   TEE WITHOUT CARDIOVERSION N/A 05/13/2018   Procedure: TRANSESOPHAGEAL ECHOCARDIOGRAM (TEE);  Surgeon: Loni Soyla LABOR, MD;  Location: [facility] ENDOSCOPY;  Service: Cardiovascular;  Laterality: N/A;   TOTAL KNEE ARTHROPLASTY Right 05/02/2013   Procedure: RIGHT TOTAL KNEE ARTHROPLASTY;  Surgeon: Donnice JONETTA Car, MD;  Location: [facility]  ORS;  Service: Orthopedics;  Laterality: Right;   Patient Active Problem List   Diagnosis Date Noted   Acute blood loss anemia 03/02/2023   Choledocholithiasis 02/28/2023   HTN (hypertension) 02/28/2023   Hypokalemia 02/28/2023   Cholelithiasis with biliary obstruction 02/27/2023   Infection of total knee replacement 11/07/2022   Hx of AKA (above knee amputation), right ([name]) 11/07/2022   Cerebrovascular disease 07/08/2022   Renal cyst 07/08/2022   Protein-calorie malnutrition, severe 07/08/2022   Toxic metabolic encephalopathy 02/26/2022   Septic joint of right knee joint ([name]) 11/08/2020   S/P right TKA reimplantation 08/02/2020   Infection of total left knee replacement 03/20/2020   Cirrhosis of liver without ascites ([name])    AKI (acute kidney injury)    Bacteremia    Spinal stenosis of lumbar region without neurogenic claudication    Aspiration pneumonia ([name]) 09/24/2018   Abscess in epidural space of lumbar spine    MRSA bacteremia 09/21/2018   Infection of prosthetic right knee joint 09/20/2018   Anemia of chronic disease 09/20/2018   Hypoalbuminemia 09/20/2018   Hypoglycemia without diagnosis of diabetes mellitus 09/20/2018   Epidural abscess 09/20/2018   Hyperkalemia 05/18/2018   Edentulous 05/11/2018   Pancytopenia ([name]) 05/10/2018   CAD (coronary artery disease) 05/10/2018   Hepatic encephalopathy ([name]) 05/10/2018   Alcohol  abuse 05/10/2018   GERD (gastroesophageal reflux disease) 05/10/2018   Duodenal ulcer    Renal failure    Acute on chronic anemia    Hypotension 06/17/2017   Hyponatremia 06/17/2017   Leg edema, right 06/17/2017   Acute metabolic encephalopathy 06/17/2017   Anemia, iron  deficiency    Benign neoplasm of ascending colon    Hemorrhoids    Portal hypertensive gastropathy ([name])    Gastritis and gastroduodenitis    Esophageal varices without bleeding ([name])    H/O: CVA (cerebrovascular accident) 12/01/2013   ACS (acute coronary syndrome) ([name])  12/01/2013   Anasarca 12/01/2013   Polysubstance abuse ([name]) 12/01/2013   CKD (chronic kidney disease) stage 3, GFR 30-59 ml/min ([name]) 12/01/2013   S/P right TKA 05/02/2013   Murmur 04/14/2013   Right inguinal hernia 12/26/2010   Cirrhosis, alcoholic ([name]) 12/20/2010    PCP: Mercer Clotilda SAUNDERS, MD  REFERRING PROVIDER: Harden Jerona GAILS, MD  ONSET DATE: 02/08/2024 MD referral to PT  REFERRING DIAG: Z89.611 (ICD-10-CM) - Hx of AKA (above knee amputation), right   THERAPY DIAG:  Other abnormalities of gait and mobility  Repeated falls  Unsteadiness on feet  Muscle weakness (generalized)  Stiffness of right hip, not elsewhere classified  Rationale for Evaluation and Treatment: Rehabilitation  SUBJECTIVE:   SUBJECTIVE STATEMENT: He has appointment with prosthetist tomorrow. He is wearing prosthesis daily most of awake hours.  No falls.    PERTINENT HISTORY: right TFA 11/07/22, inguinal hernia repair BLEs, ETOH abuse, cirrhosis, arthritis, GERD, HTN, HLD, heart murmur, MI, TIA 2011, systemic inflammatory response syndrome   Patient underwent a right Transfemoral Amputation on 11/07/2022 due to infected TKA. Initial right TKA 2015 with antibiotic  spacers 2021 & 2022 with I&D. He got prosthesis on 02/10/2023. He had got new socket in April 2025.  The prosthesis slides off some times.  He is wearing prosthesis daily most of awake hours except if he is staying home.     PAIN:  NPRS scale: not rated this date  Pain location: right residual limb Pain description: soreness Aggravating factors: unknown Relieving factors: it goes away on its own  PRECAUTIONS: Fall  WEIGHT BEARING RESTRICTIONS: No  FALLS:  Has patient fallen in last 6 months? Yes. Number of falls >4 x/month. He denies injuries.  If prosthesis twist and catches prosthesis going up steps.   LIVING ENVIRONMENT: Lives with: lives with their spouse and dog 60#  Lives in: House Home Access: Stairs to enter Home layout:  One level Stairs: Yes: External: 3 steps to porch without rails and threshold into house Has following equipment at home: Single point cane, Environmental Consultant - 2 wheeled, Chief Operating Officer, Wheelchair (manual), and Tour manager  OCCUPATION:  retired does odd jobs lobbyist, estate manager/land agent, cut wood   PLOF: Independent  PATIENT GOALS:   He wants to walk   Next MD visit:  OBJECTIVE:   PATIENT SURVEYS:  Patient-Specific Activity Scoring Scheme  0 represents unable to perform. 10 represents able to perform at prior level. 0 1 2 3 4 5 6 7 8 9  10 (Date and Score)  Activity Eval     1.   Standing ADLs   8    2.  Walking with prosthesis  4    3.  Repair cars, yard work, chop wood 6   4.    5.    Score 6    Total score = sum of the activity scores/number of activities Minimum detectable change (90%CI) for average score = 2 points Minimum detectable change (90%CI) for single activity score = 3 points  COGNITION: Overall cognitive status: WFL    SENSATION: WFL  MUSCLE LENGTH: Evaluation on 02/29/2024: Debby test: Right -25 deg;  POSTURE:  Evaluation on 02/29/2024: rounded shoulders, forward head, flexed trunk , and weight shift left  LOWER EXTREMITY ROM:   ROM Left eval  Hip flexion   Hip extension -12*  Hip abduction   Hip adduction   Hip internal rotation   Hip external rotation   Knee flexion   Knee extension   Ankle dorsiflexion   Ankle plantarflexion   Ankle inversion   Ankle eversion    (Blank rows = not tested)  LOWER EXTREMITY MMT:  MMT Left eval  Hip flexion   Hip extension 4/5  Hip abduction 4/5  Hip adduction   Hip internal rotation   Hip external rotation   Knee flexion   Knee extension   Ankle dorsiflexion   Ankle plantarflexion   Ankle inversion   Ankle eversion    (Blank rows = not tested)  TRANSFERS: Evaluation on 02/29/2024: Sit to stand: SBA from 18 chair with armrest uses back of legs against chair to stabilize and requires  external support of crutches to position lower extremities for stabilization. Stand to sit: SBA requires use of armrest to control descent to 18 chair  FUNCTIONAL TESTs:  03/30/2024: Patient able to reach anteriorly 7 and look over shoulders without upper extremity support with supervision safely.  Evaluation on 02/29/2024: Lars Balance 20/56  [facility] PT Assessment - 02/29/24 1300       Standardized Balance Assessment   Standardized Balance Assessment Berg Balance Test      Lars  Balance Test   Sit to Stand Needs minimal aid to stand or to stabilize    Standing Unsupported Able to stand 2 minutes with supervision    Sitting with Back Unsupported but Feet Supported on Floor or Stool Able to sit safely and securely 2 minutes    Stand to Sit Controls descent by using hands    Transfers Able to transfer safely, definite need of hands    Standing Unsupported with Eyes Closed Unable to keep eyes closed 3 seconds but stays steady    Standing Unsupported with Feet Together Needs help to attain position but able to stand for 30 seconds with feet together    From Standing, Reach Forward with Outstretched Arm Reaches forward but needs supervision    From Standing Position, Pick up Object from Floor Unable to pick up and needs supervision    From Standing Position, Turn to Look Behind Over each Shoulder Needs supervision when turning    Turn 360 Degrees Needs assistance while turning    Standing Unsupported, Alternately Place Feet on Step/Stool Needs assistance to keep from falling or unable to try    Standing Unsupported, One Foot in [employer] balance while stepping or standing    Standing on One Leg Tries to lift leg/unable to hold 3 seconds but remains standing independently    Total Score 20    Berg comment: BERG  < 36 high risk for falls (close to 100%) 46-51 moderate (>50%)   37-45 significant (>80%) 52-55 lower (> 25%)         GAIT: 03/30/2024: Patient ambulates 200 feet with forearm  crutches 4-point gait pattern with prosthetic knee flexion approximately 50% of the time with supervision/PT cues. Patient negotiates ramp and curb with forearm crutches with PT supervision/verbal cues on technique.  Evaluation on 02/29/2024: Gait pattern: step to pattern, decreased step length- Left, decreased stance time- Right, decreased hip/knee flexion- Right, circumduction- Right, Right hip hike, antalgic, trunk flexed, and abducted- Right patient is fearful to unlock his prosthesis for swing phase. Pistoning noted with prosthesis due to suspension issue. Distance walked: 120' Assistive device utilized: Crutches and transfemoral prosthesis Level of assistance: SBA only when using crutches.  He would require greater assistance if he was using a cane or no device.  Gait velocity: 1.37 ft/sec Comments: Excessive weightbearing on axillary crutches  CURRENT PROSTHETIC WEAR ASSESSMENT: 03/30/2024: Patient reports wearing prosthesis greater than 12 hours/day with limb pain less than 5/10. Patient verbalizes proper donning of prosthesis.  Evaluation on 02/29/2024: Patient is independent with: skin check Patient is dependent with: prosthetic cleaning, correct ply sock adjustment, and proper wear schedule/adjustment Donning prosthesis: SBA patient needs cues for new suspension system with suction ring Doffing prosthesis: Modified independence Prosthetic wear tolerance: Patient reports wearing prosthesis most of awake hours on days that he is leaving his home.  On days that he stays home he reports not wearing the prosthesis at all. Prosthetic weight bearing tolerance: 10 minutes with no increase in residual limb pain reported Edema: None Residual limb condition: No open areas, cylindrical shape, normal temperature and moisture level.  Prosthetic description: Silicone liner with suction ring suspension, ischial containment socket with flexible inner socket.  Multiaxial knee.    TODAY'S  TREATMENT:  DATE:  05/17/2024: Prosthetic Care with transfemoral prosthesis: PT worked in parallel bars with tactile, verbal, visual (mirror) and demo cues on upright posture (not staring at floor), step length with heel passing contralateral toes, weight shift over prosthesis in stance and step width.   Pt amb 100' & 140' with forearm crutches with carryover of above.   At prosthetist appt tomorrow for height check, PT verbal cues that he needs to stand upright trunk, even weight on feet and make sure Left knee is extended.  When he ambulates for prosthetist to do alignment, try to carryover today's gait recommendations.  Pt verbalized understanding.    TREATMENT:                                                                                                                             DATE:  05/12/2024: Prosthetic Care with transfemoral prosthesis: Patient ambulated 3x125' with bilat lofstrand crutches and SUP>SBA with verbal cues from PT. Patient ambulated using 4-point technique, though PT attempted to transition to 2-point technique with bilat lofstrand crutches. Patient unable to appropriately perform throughout session without consistent verbal cues, several standing breaks to discuss technique, and seated rest breaks between in order to verbalize technique without dual tasking. Though patient had difficulty with new ambulation technique, patient was able to increase time with forward gaze and intermittent Rt knee flexion with terminal stance. PT discussed importance of visualizing and verbalizing the 2-point technique while at home in order to increase brain-body connection and carryover into performance.   TREATMENT:                                                                                                                             DATE:  05/10/2024: Prosthetic Care  with transfemoral prosthesis: Clean the liner every night with soap and water  and rinse the soap off Nightly wash your leg thoroughly and apply lotion. Before removing prothesis grab a warm towel and use this to rub the itch on your leg instead of nails  Every morning use a warm wash cloth to wipe off any residual lotion off of your leg ; can apply hydrocortisone to isolated areas that are itching but not whole leg Can use 1/2 alcohol  1/2 water  spray or hand sanitizer to lubricate ring to slide down into the socket better.   The sock should be tucked up under the ring BUT NOT crossing the ring.  Feel  around back of ring to make sure that it is not flipped.  After you have slid down into socket. Pick up left leg to put weight on prosthesis which will settle your limb into socket better.  You can press button on prosthesis to let air out.  Fold the top of the sock over the outside of prosthesis to keep them from sliding into the prosthesis.  PT printed above HO for pt information. He verbalized understanding.   PT demo & verbal cues on upright posture with walking.  You can glance at floor to check placement of prosthetic foot, but then you look to forward where you are going.  DO NOT STARE at the floor.  Pt amb 100' with forearm crutches with PT cues for carryover.     TREATMENT:                                                                                                                             DATE:  05/05/2024: Prosthetic Care with transfemoral prosthesis: PT discussed importance of reaching back out to prosthetist in order to fix leg length discrepancy with Rt prosthesis.  PT educated patient on appropriate height of lofstrand crutches and how they can affect appropriate posture with ambulation. PT adjusted and locked lofstrand crutches to appropriate height.  PT discussed importance of performing HEP at home everyday, especially hip stretch, posture, and balance activities. Patient  acknowledged and agreed.  Patient ambulated with bilat lofstrand crutches and supervision for 1x129', 1x99' requiring several cues for forward gaze, upright posture, and Rt knee flexion with terminal stance. Patient having difficulty with dual tasking and verbal cues which led to incorrect 4-point gait pattern, though patient able to return to appropriate gait pattern and form after standing still and verbalizing the process.   Neuromuscular reeducation: Patient performed standing balance exercises with green TB, CGA, and 1 inch lift placed under Rt prosthesis. 1x12 rows with each UE individually and then 1x12 with bilat UE, then patient performing 1x12 flexion punches with each UE individually then 1x5 with bilat UE. Patient requiring lateral weight shift onto Rt LE by PT as well as tactile cues to chest and glutes to improve posture intermittently throughout. Patient able to hold appropriate form following PT release of patient for at least 5 seconds before returning to increased trunk flexion and Lt weight shift.     TREATMENT:  DATE:  03/30/2024: Prosthetic Care with transfemoral prosthesis: Patient ambulates 200 feet with forearm crutches 4-point gait pattern with prosthetic knee flexion approximately 50% of the time with supervision/PT cues. Patient negotiates ramp and curb with forearm crutches with PT supervision/verbal cues on technique. PT assessed height of prosthesis which appears to be approximately 1 inch too short.  Neuromuscular reeducation: Standing balance (1 lift under prosthetic foot) with counter support posterior and chair back anterior for safety: Performing Zoom ball for 2 minutes with patient able to perform approximately 3 consecutive reps before having to touch chair for balance/stability. Active back extension flexing forward placing hands in seat of  a chair under control and returning to upright position with active back extension 10 reps.  PT supervision for balance and safety including maintaining equal weightbearing through bilateral lower extremities.   HOME EXERCISE PROGRAM: Access Code: [email or web address] URL: [email or web address] Date: 03/14/2024 Prepared by: Grayce Spatz  Exercises - Standing posture with back to counter  - 1-3 x daily - 7 x weekly - 1 sets - 1 reps - 2-5 minutes hold - standing back extension  - 1-3 x daily - 7 x weekly - 1 sets - 10 reps - 5 seconds hold - Modified Thomas Stretch  - 1-3 x daily - 7 x weekly - 1 sets - 3 reps - 30 seconds hold   ASSESSMENT:  CLINICAL IMPRESSION: Patient improved gait following PT instructions.  His prosthesis continues to be too short and he has appt with prosthetist tomorrow.   OBJECTIVE IMPAIRMENTS: Abnormal gait, decreased activity tolerance, decreased balance, decreased endurance, decreased knowledge of condition, decreased knowledge of use of DME, decreased mobility, difficulty walking, decreased ROM, decreased strength, postural dysfunction, prosthetic dependency , and pain.   ACTIVITY LIMITATIONS: carrying, lifting, sitting, standing, stairs, transfers, and locomotion level  PARTICIPATION LIMITATIONS: meal prep, cleaning, and community activity  PERSONAL FACTORS: Age, Past/current experiences, Time since onset of injury/illness/exacerbation, and 3+ comorbidities: see PMH are also affecting patient's functional outcome.   REHAB POTENTIAL: Good  CLINICAL DECISION MAKING: Evolving/moderate complexity  EVALUATION COMPLEXITY: Moderate   GOALS: Goals reviewed with patient? Yes  SHORT TERM GOALS: Target date: 03/30/2024  Patient donnes prosthesis modified independent correctly Baseline: SEE OBJECTIVE DATA Goal status: MET 03/30/2024 2.  Patient tolerates prosthesis wear daily for >12 hours without skin issues or limb pain over 5/10 Baseline: SEE OBJECTIVE  DATA Goal status:  MET 03/30/2024  3.  Patient able to reach 7 and look over both shoulders without UE support with supervision. Baseline: SEE OBJECTIVE DATA Goal status:  MET 03/30/2024  4. Patient ambulates 200' with cruthes & prosthesis with supervision with prosthetic knee flexion >50% of steps for swing phase. Baseline: SEE OBJECTIVE DATA Goal status:    MET 03/30/2024  5. Patient negotiates ramps & curbs with crutches & prosthesis with supervision. Baseline: SEE OBJECTIVE DATA Goal status:  MET 03/30/2024  LONG TERM GOALS: Target date: 05/27/2023  Patient demonstrates & verbalized understanding of prosthetic care to enable safe utilization of prosthesis. Baseline: SEE OBJECTIVE DATA Goal status: Ongoing   05/17/2024  Patient tolerates prosthesis wear >90% of awake hours without skin or limb pain >2/10 issues. Baseline: SEE OBJECTIVE DATA Goal status: Ongoing    05/17/2024  Berg Balance >36/56 to indicate lower fall risk Baseline: SEE OBJECTIVE DATA Goal status: Ongoing   05/17/2024  Patient ambulates >250' with prosthesis with cane or less independently Baseline: SEE OBJECTIVE DATA Goal status: Ongoing   05/17/2024  Patient negotiates ramps, curbs & stairs  with single rail with prosthesis  and cane or less independently. Baseline: SEE OBJECTIVE DATA Goal status: Ongoing   05/17/2024  8.  Patient reports Patient-Specific Activity Score improved to average 8 to indicate improvement in functional activities.   Baseline: SEE OBJECTIVE DATA Goal status: Ongoing  05/17/2024  PLAN:  PT FREQUENCY: 2x/week  PT DURATION: 12 weeks  PLANNED INTERVENTIONS: 97164- PT Re-evaluation, 97750- Physical Performance Testing, 97110-Therapeutic exercises, 97530- Therapeutic activity, [id number]- Neuromuscular re-education, [phone]- Self Care, [id number]- Gait training, [phone]- Prosthetic Initial , [phone]- Orthotic/Prosthetic subsequent, Patient/Family education, Balance training, and Stair training  PLAN FOR  NEXT SESSION:   do 10th visit progress note.  continue with prosthetic gait with forearm crutches working on terminal stance & swing prosthetic knee flexion and weight shift over prosthesis in stance and neg ramps / curbs, postural strengthening/flexibility, balance activities    Grayce Spatz, PT, DPT 05/17/2024, 4:14 PM     Date of referral: 02/08/2024 Referring provider: Jerona Sage Referring diagnosis? Z89.611 (ICD-10-CM) - Hx of AKA (above knee amputation), right  Treatment diagnosis? (if different than referring diagnosis) R26.89, R29.6, R26.81, M62.81, M25.651  What was this (referring dx) caused by? Surgery (Type: right AKA) and Felton Hawks of Condition: Recurrent (multiple episodes of < 3 months)   Laterality: Rt  Current Functional Measure Score:  Berg Balance 20/56  Objective measurements identify impairments when they are compared to normal values, the uninvolved extremity, and prior level of function.  [x]  Yes  []  No  Objective assessment of functional ability: Moderate functional limitations   Briefly describe symptoms:  patient is dependent in prosthetic care.  Patient has recurrent falls and Berg Balance 20/56 indicates high fall risk.  Patient has dependent prosthetic gait with deviations indicating high fall risk.    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  He is dependent in use especially donning suction ring suspension correctly.   Average pain intensity:  Last 24 hours: 7/10  Past week: 7/10  How often does the pt experience symptoms? Frequently  How much have the symptoms interfered with usual daily activities? Quite a bit  How has condition changed since care began at this facility? NA - initial visit  In general, how is the patients overall health? Good   BACK PAIN (STarT Back Screening Tool) No  "

## 2024-05-19 ENCOUNTER — Ambulatory Visit

## 2024-05-19 DIAGNOSIS — M6281 Muscle weakness (generalized): Secondary | ICD-10-CM

## 2024-05-19 DIAGNOSIS — R296 Repeated falls: Secondary | ICD-10-CM

## 2024-05-19 DIAGNOSIS — R2681 Unsteadiness on feet: Secondary | ICD-10-CM | POA: Diagnosis not present

## 2024-05-19 DIAGNOSIS — M25651 Stiffness of right hip, not elsewhere classified: Secondary | ICD-10-CM | POA: Diagnosis not present

## 2024-05-19 DIAGNOSIS — R2689 Other abnormalities of gait and mobility: Secondary | ICD-10-CM | POA: Diagnosis not present

## 2024-05-19 NOTE — Therapy (Signed)
 "  OUTPATIENT PHYSICAL THERAPY PROSTHETIC TREATMENT / PROGRESS NOTE   Patient Name: Mark [name]. MRN: [id number] DOB:08-17-1955, 69 y.o., male Today's Date: 05/19/2024   Progress Note Reporting Period 02/28/2025 to 05/19/2024  See note below for Objective Data and Assessment of Progress/Goals.      END OF SESSION:  PT End of Session - 05/19/24 1429     Visit Number 10    Number of Visits 25    Date for Recertification  05/26/24    Authorization Type [employer] Medicare & [employer] state health    Progress Note Due on Visit 20    PT Start Time 1434    PT Stop Time 1513    PT Time Calculation (min) 39 min    Equipment Utilized During Treatment Gait belt    Activity Tolerance Patient tolerated treatment well    Behavior During Therapy WFL for tasks assessed/performed              Past Medical History:  Diagnosis Date   Alcohol  abuse    Anemia    Arthritis    Ascites    Cirrhosis ([name])    Coffee ground emesis    Dehydration 06/17/2017   Febrile illness    GERD (gastroesophageal reflux disease)    Heart murmur    History of blood transfusion    Hyperlipidemia    Hypertension    Leg swelling    Myocardial infarction ([name]) 2012   Preop cardiovascular exam 04/14/2013   Sepsis ([name]) 06/17/2017   Septic shock ([name]) 06/18/2017   SIRS (systemic inflammatory response syndrome) ([name]) 07/11/2017   Stroke ([name]) 10/2009   TIA   Thrombocytopenia    Past Surgical History:  Procedure Laterality Date   AMPUTATION Right 11/07/2022   Procedure: RIGHT ABOVE KNEE AMPUTATION;  Surgeon: Harden Jerona GAILS, MD;  Location: [facility] OR;  Service: Orthopedics;  Laterality: Right;   COLONOSCOPY WITH PROPOFOL  N/A 02/07/2016   Procedure: COLONOSCOPY WITH PROPOFOL ;  Surgeon: Toribio SHAUNNA Cedar, MD;  Location: [facility];  Service: Endoscopy;  Laterality: N/A;   ERCP N/A 02/28/2023   Procedure: ENDOSCOPIC RETROGRADE CHOLANGIOPANCREATOGRAPHY (ERCP);  Surgeon: Rollin Dover, MD;  Location: [facility];  Service: Gastroenterology;  Laterality: N/A;   ESOPHAGOGASTRODUODENOSCOPY (EGD) WITH PROPOFOL  N/A 02/07/2016   Procedure: ESOPHAGOGASTRODUODENOSCOPY (EGD) WITH PROPOFOL ;  Surgeon: Toribio SHAUNNA Cedar, MD;  Location: [facility];  Service: Endoscopy;  Laterality: N/A;   ESOPHAGOGASTRODUODENOSCOPY (EGD) WITH PROPOFOL  N/A 06/22/2017   Procedure: ESOPHAGOGASTRODUODENOSCOPY (EGD) WITH PROPOFOL ;  Surgeon: Albertus Gordy HERO, MD;  Location: [facility] ENDOSCOPY;  Service: Gastroenterology;  Laterality: N/A;   EXCISIONAL TOTAL KNEE ARTHROPLASTY WITH ANTIBIOTIC SPACERS Right 03/20/2020   Procedure: Resection right total knee arthroplasty and placement of antibiotic spacer;  Surgeon: Ernie Cough, MD;  Location: [facility];  Service: Orthopedics;  Laterality: Right;  90 mins   HERNIA REPAIR Right    inguinal   INGUINAL HERNIA REPAIR Left 02/08/2021   Procedure: OPEN LEFT INGUINAL HERNIA REPAIR WITH MESH;  Surgeon: Vernetta Berg, MD;  Location: [facility] OR;  Service: General;  Laterality: Left;   IRRIGATION AND DEBRIDEMENT KNEE Right 11/13/2020   Procedure: IRRIGATION AND DEBRIDEMENT KNEE;  Surgeon: Ernie Cough, MD;  Location: [facility];  Service: Orthopedics;  Laterality: Right;   JOINT REPLACEMENT     KNEE ARTHROSCOPY     bilateral/  12/14   REIMPLANTATION OF TOTAL KNEE Right 08/02/2020   Procedure: REIMPLANTATION/REVISION OF TOTAL KNEE WITH REMOVAL OF ANTIBIOTIC SPACER;  Surgeon: Ernie Cough, MD;  Location: [facility];  Service: Orthopedics;  Laterality: Right;    REMOVAL OF STONES  02/28/2023   Procedure: REMOVAL OF DEBRIS;  Surgeon: Rollin Dover, MD;  Location: [facility];  Service: Gastroenterology;;   ANNETT  02/28/2023   Procedure: ANNETT;  Surgeon: Rollin Dover, MD;  Location: [facility];  Service: Gastroenterology;;   TEE WITHOUT CARDIOVERSION N/A 05/13/2018   Procedure: TRANSESOPHAGEAL ECHOCARDIOGRAM (TEE);  Surgeon: Loni Soyla LABOR, MD;  Location: [facility] ENDOSCOPY;  Service:  Cardiovascular;  Laterality: N/A;   TOTAL KNEE ARTHROPLASTY Right 05/02/2013   Procedure: RIGHT TOTAL KNEE ARTHROPLASTY;  Surgeon: Donnice JONETTA Car, MD;  Location: [facility];  Service: Orthopedics;  Laterality: Right;   Patient Active Problem List   Diagnosis Date Noted   Acute blood loss anemia 03/02/2023   Choledocholithiasis 02/28/2023   HTN (hypertension) 02/28/2023   Hypokalemia 02/28/2023   Cholelithiasis with biliary obstruction 02/27/2023   Infection of total knee replacement 11/07/2022   Hx of AKA (above knee amputation), right ([name]) 11/07/2022   Cerebrovascular disease 07/08/2022   Renal cyst 07/08/2022   Protein-calorie malnutrition, severe 07/08/2022   Toxic metabolic encephalopathy 02/26/2022   Septic joint of right knee joint ([name]) 11/08/2020   S/P right TKA reimplantation 08/02/2020   Infection of total left knee replacement 03/20/2020   Cirrhosis of liver without ascites ([name])    AKI (acute kidney injury)    Bacteremia    Spinal stenosis of lumbar region without neurogenic claudication    Aspiration pneumonia ([name]) 09/24/2018   Abscess in epidural space of lumbar spine    MRSA bacteremia 09/21/2018   Infection of prosthetic right knee joint 09/20/2018   Anemia of chronic disease 09/20/2018   Hypoalbuminemia 09/20/2018   Hypoglycemia without diagnosis of diabetes mellitus 09/20/2018   Epidural abscess 09/20/2018   Hyperkalemia 05/18/2018   Edentulous 05/11/2018   Pancytopenia ([name]) 05/10/2018   CAD (coronary artery disease) 05/10/2018   Hepatic encephalopathy ([name]) 05/10/2018   Alcohol  abuse 05/10/2018   GERD (gastroesophageal reflux disease) 05/10/2018   Duodenal ulcer    Renal failure    Acute on chronic anemia    Hypotension 06/17/2017   Hyponatremia 06/17/2017   Leg edema, right 06/17/2017   Acute metabolic encephalopathy 06/17/2017   Anemia, iron  deficiency    Benign neoplasm of ascending colon    Hemorrhoids    Portal hypertensive gastropathy ([name])     Gastritis and gastroduodenitis    Esophageal varices without bleeding ([name])    H/O: CVA (cerebrovascular accident) 12/01/2013   ACS (acute coronary syndrome) ([name]) 12/01/2013   Anasarca 12/01/2013   Polysubstance abuse ([name]) 12/01/2013   CKD (chronic kidney disease) stage 3, GFR 30-59 ml/min ([name]) 12/01/2013   S/P right TKA 05/02/2013   Murmur 04/14/2013   Right inguinal hernia 12/26/2010   Cirrhosis, alcoholic ([name]) 12/20/2010    PCP: Mercer Clotilda SAUNDERS, MD  REFERRING PROVIDER: Harden Jerona GAILS, MD  ONSET DATE: 02/08/2024 MD referral to PT  REFERRING DIAG: Z89.611 (ICD-10-CM) - Hx of AKA (above knee amputation), right   THERAPY DIAG:  Other abnormalities of gait and mobility  Repeated falls  Unsteadiness on feet  Muscle weakness (generalized)  Stiffness of right hip, not elsewhere classified  Rationale for Evaluation and Treatment: Rehabilitation  SUBJECTIVE:   SUBJECTIVE STATEMENT: Patient reports that the prosthetist put half an inch on the prosthesis. Patient reports that he can tell a positive difference and that it is helping him stand straighter with the adjustments that were made.  PERTINENT HISTORY: right TFA 11/07/22, inguinal hernia repair BLEs, ETOH abuse, cirrhosis, arthritis, GERD, HTN, HLD, heart murmur, MI, TIA 2011, systemic inflammatory response syndrome   Patient underwent a right Transfemoral Amputation on 11/07/2022 due to infected TKA. Initial right TKA 2015 with antibiotic spacers 2021 & 2022 with I&D. He got prosthesis on 02/10/2023. He had got new socket in April 2025.  The prosthesis slides off some times.  He is wearing prosthesis daily most of awake hours except if he is staying home.     PAIN:  NPRS scale: not rated this date  Pain location: right residual limb Pain description: soreness Aggravating factors: unknown Relieving factors: it goes away on its own  PRECAUTIONS: Fall  WEIGHT BEARING RESTRICTIONS: No  FALLS:  Has patient fallen  in last 6 months? Yes. Number of falls >4 x/month. He denies injuries.  If prosthesis twist and catches prosthesis going up steps.   LIVING ENVIRONMENT: Lives with: lives with their spouse and dog 60#  Lives in: House Home Access: Stairs to enter Home layout: One level Stairs: Yes: External: 3 steps to porch without rails and threshold into house Has following equipment at home: Single point cane, Environmental Consultant - 2 wheeled, Chief Operating Officer, Wheelchair (manual), and Tour manager  OCCUPATION:  retired does odd jobs lobbyist, estate manager/land agent, cut wood   PLOF: Independent  PATIENT GOALS:   He wants to walk   Next MD visit:  OBJECTIVE:   PATIENT SURVEYS:  Patient-Specific Activity Scoring Scheme  0 represents unable to perform. 10 represents able to perform at prior level. 0 1 2 3 4 5 6 7 8 9  10 (Date and Score)  Activity Eval  05/19/2024   1.   Standing ADLs   8 7   2.  Walking with prosthesis  4  7  3.  Repair cars, yard work, chop wood 6 5  4.    5.    Score 6 6.33   Total score = sum of the activity scores/number of activities Minimum detectable change (90%CI) for average score = 2 points Minimum detectable change (90%CI) for single activity score = 3 points  COGNITION: Overall cognitive status: WFL    SENSATION: WFL  MUSCLE LENGTH: Evaluation on 02/29/2024: Debby test: Right -25 deg;  POSTURE:  Evaluation on 02/29/2024: rounded shoulders, forward head, flexed trunk , and weight shift left  LOWER EXTREMITY ROM:   ROM Left eval  Hip flexion   Hip extension -12*  Hip abduction   Hip adduction   Hip internal rotation   Hip external rotation   Knee flexion   Knee extension   Ankle dorsiflexion   Ankle plantarflexion   Ankle inversion   Ankle eversion    (Blank rows = not tested)  LOWER EXTREMITY MMT:  MMT Left eval  Hip flexion   Hip extension 4/5  Hip abduction 4/5  Hip adduction   Hip internal rotation   Hip external rotation   Knee  flexion   Knee extension   Ankle dorsiflexion   Ankle plantarflexion   Ankle inversion   Ankle eversion    (Blank rows = not tested)  TRANSFERS: Evaluation on 02/29/2024: Sit to stand: SBA from 18 chair with armrest uses back of legs against chair to stabilize and requires external support of crutches to position lower extremities for stabilization. Stand to sit: SBA requires use of armrest to control descent to 18 chair  FUNCTIONAL TESTs:  03/30/2024: Patient able to reach anteriorly 7 and look over  shoulders without upper extremity support with supervision safely.  Evaluation on 02/29/2024: Berg Balance 20/56  [facility] PT Assessment - 02/29/24 1300       Standardized Balance Assessment   Standardized Balance Assessment Berg Balance Test      Berg Balance Test   Sit to Stand Needs minimal aid to stand or to stabilize    Standing Unsupported Able to stand 2 minutes with supervision    Sitting with Back Unsupported but Feet Supported on Floor or Stool Able to sit safely and securely 2 minutes    Stand to Sit Controls descent by using hands    Transfers Able to transfer safely, definite need of hands    Standing Unsupported with Eyes Closed Unable to keep eyes closed 3 seconds but stays steady    Standing Unsupported with Feet Together Needs help to attain position but able to stand for 30 seconds with feet together    From Standing, Reach Forward with Outstretched Arm Reaches forward but needs supervision    From Standing Position, Pick up Object from Floor Unable to pick up and needs supervision    From Standing Position, Turn to Look Behind Over each Shoulder Needs supervision when turning    Turn 360 Degrees Needs assistance while turning    Standing Unsupported, Alternately Place Feet on Step/Stool Needs assistance to keep from falling or unable to try    Standing Unsupported, One Foot in [employer] balance while stepping or standing    Standing on One Leg Tries to lift  leg/unable to hold 3 seconds but remains standing independently    Total Score 20    Berg comment: BERG  < 36 high risk for falls (close to 100%) 46-51 moderate (>50%)   37-45 significant (>80%) 52-55 lower (> 25%)          [facility] PT Assessment - 05/19/24 0001       Berg Balance Test   Sit to Stand Able to stand  independently using hands    Standing Unsupported Able to stand 2 minutes with supervision    Sitting with Back Unsupported but Feet Supported on Floor or Stool Able to sit safely and securely 2 minutes    Stand to Sit Controls descent by using hands    Transfers Able to transfer safely, definite need of hands    Standing Unsupported with Eyes Closed Able to stand 10 seconds with supervision    Standing Unsupported with Feet Together Needs help to attain position and unable to hold for 15 seconds    From Standing, Reach Forward with Outstretched Arm Can reach forward >12 cm safely (5)    From Standing Position, Pick up Object from Floor Able to pick up shoe, needs supervision    From Standing Position, Turn to Look Behind Over each Shoulder Turn sideways only but maintains balance    Turn 360 Degrees Needs close supervision or verbal cueing    Standing Unsupported, Alternately Place Feet on Step/Stool Needs assistance to keep from falling or unable to try    Standing Unsupported, One Foot in Front Able to take small step independently and hold 30 seconds    Standing on One Leg Able to lift leg independently and hold equal to or more than 3 seconds    Total Score 32    Berg comment: BERG  < 36 high risk for falls (close to 100%) 46-51 moderate (>50%)   37-45 significant (>80%) 52-55 lower (> 25%)  GAIT: 03/30/2024: Patient ambulates 200 feet with forearm crutches 4-point gait pattern with prosthetic knee flexion approximately 50% of the time with supervision/PT cues. Patient negotiates ramp and curb with forearm crutches with PT supervision/verbal cues on  technique.  Evaluation on 02/29/2024: Gait pattern: step to pattern, decreased step length- Left, decreased stance time- Right, decreased hip/knee flexion- Right, circumduction- Right, Right hip hike, antalgic, trunk flexed, and abducted- Right patient is fearful to unlock his prosthesis for swing phase. Pistoning noted with prosthesis due to suspension issue. Distance walked: 120' Assistive device utilized: Crutches and transfemoral prosthesis Level of assistance: SBA only when using crutches.  He would require greater assistance if he was using a cane or no device.  Gait velocity: 1.37 ft/sec Comments: Excessive weightbearing on axillary crutches  CURRENT PROSTHETIC WEAR ASSESSMENT: 03/30/2024: Patient reports wearing prosthesis greater than 12 hours/day with limb pain less than 5/10. Patient verbalizes proper donning of prosthesis.  Evaluation on 02/29/2024: Patient is independent with: skin check Patient is dependent with: prosthetic cleaning, correct ply sock adjustment, and proper wear schedule/adjustment Donning prosthesis: SBA patient needs cues for new suspension system with suction ring Doffing prosthesis: Modified independence Prosthetic wear tolerance: Patient reports wearing prosthesis most of awake hours on days that he is leaving his home.  On days that he stays home he reports not wearing the prosthesis at all. Prosthetic weight bearing tolerance: 10 minutes with no increase in residual limb pain reported Edema: None Residual limb condition: No open areas, cylindrical shape, normal temperature and moisture level.  Prosthetic description: Silicone liner with suction ring suspension, ischial containment socket with flexible inner socket.  Multiaxial knee.   Gait velocity: 05/19/2024 performed with SUP and bilat lofstrand crutches  Self-selected: 0.75 ft/sec Fast as comfortable: 2.22 ft/sec   TODAY'S TREATMENT:                                                                                                                              DATE:  05/19/2024: Prosthetic Care with transfemoral prosthesis: PT discussed progress and plan of care with patient.  Patient performed ascending/descending ramp and curb with SBA-CGA and bilat lofstrand crutches while also requiring verbal cues for ascending curb.   Physical Performance:  BERG and gait velocity performed with results noted above and discussed with patient.   TODAY'S TREATMENT:  DATE:  05/17/2024: Prosthetic Care with transfemoral prosthesis: PT worked in parallel bars with tactile, verbal, visual (mirror) and demo cues on upright posture (not staring at floor), step length with heel passing contralateral toes, weight shift over prosthesis in stance and step width.   Pt amb 100' & 140' with forearm crutches with carryover of above.   At prosthetist appt tomorrow for height check, PT verbal cues that he needs to stand upright trunk, even weight on feet and make sure Left knee is extended.  When he ambulates for prosthetist to do alignment, try to carryover today's gait recommendations.  Pt verbalized understanding.    TREATMENT:                                                                                                                             DATE:  05/12/2024: Prosthetic Care with transfemoral prosthesis: Patient ambulated 3x125' with bilat lofstrand crutches and SUP>SBA with verbal cues from PT. Patient ambulated using 4-point technique, though PT attempted to transition to 2-point technique with bilat lofstrand crutches. Patient unable to appropriately perform throughout session without consistent verbal cues, several standing breaks to discuss technique, and seated rest breaks between in order to verbalize technique without dual tasking. Though patient had difficulty with new ambulation  technique, patient was able to increase time with forward gaze and intermittent Rt knee flexion with terminal stance. PT discussed importance of visualizing and verbalizing the 2-point technique while at home in order to increase brain-body connection and carryover into performance.   TREATMENT:                                                                                                                             DATE:  05/10/2024: Prosthetic Care with transfemoral prosthesis: Clean the liner every night with soap and water  and rinse the soap off Nightly wash your leg thoroughly and apply lotion. Before removing prothesis grab a warm towel and use this to rub the itch on your leg instead of nails  Every morning use a warm wash cloth to wipe off any residual lotion off of your leg ; can apply hydrocortisone to isolated areas that are itching but not whole leg Can use 1/2 alcohol  1/2 water  spray or hand sanitizer to lubricate ring to slide down into the socket better.   The sock should be tucked up under the ring BUT NOT crossing the ring.  Feel  around back of ring to make sure that it is not flipped.  After you have slid down into socket. Pick up left leg to put weight on prosthesis which will settle your limb into socket better.  You can press button on prosthesis to let air out.  Fold the top of the sock over the outside of prosthesis to keep them from sliding into the prosthesis.  PT printed above HO for pt information. He verbalized understanding.   PT demo & verbal cues on upright posture with walking.  You can glance at floor to check placement of prosthetic foot, but then you look to forward where you are going.  DO NOT STARE at the floor.  Pt amb 100' with forearm crutches with PT cues for carryover.     TREATMENT:                                                                                                                             DATE:  05/05/2024: Prosthetic Care with  transfemoral prosthesis: PT discussed importance of reaching back out to prosthetist in order to fix leg length discrepancy with Rt prosthesis.  PT educated patient on appropriate height of lofstrand crutches and how they can affect appropriate posture with ambulation. PT adjusted and locked lofstrand crutches to appropriate height.  PT discussed importance of performing HEP at home everyday, especially hip stretch, posture, and balance activities. Patient acknowledged and agreed.  Patient ambulated with bilat lofstrand crutches and supervision for 1x129', 1x99' requiring several cues for forward gaze, upright posture, and Rt knee flexion with terminal stance. Patient having difficulty with dual tasking and verbal cues which led to incorrect 4-point gait pattern, though patient able to return to appropriate gait pattern and form after standing still and verbalizing the process.   Neuromuscular reeducation: Patient performed standing balance exercises with green TB, CGA, and 1 inch lift placed under Rt prosthesis. 1x12 rows with each UE individually and then 1x12 with bilat UE, then patient performing 1x12 flexion punches with each UE individually then 1x5 with bilat UE. Patient requiring lateral weight shift onto Rt LE by PT as well as tactile cues to chest and glutes to improve posture intermittently throughout. Patient able to hold appropriate form following PT release of patient for at least 5 seconds before returning to increased trunk flexion and Lt weight shift.     HOME EXERCISE PROGRAM: Access Code: [email or web address] URL: [email or web address] Date: 03/14/2024 Prepared by: Grayce Spatz  Exercises - Standing posture with back to counter  - 1-3 x daily - 7 x weekly - 1 sets - 1 reps - 2-5 minutes hold - standing back extension  - 1-3 x daily - 7 x weekly - 1 sets - 10 reps - 5 seconds hold - Modified Thomas Stretch  - 1-3 x daily - 7 x weekly - 1 sets - 3 reps - 30 seconds  hold   ASSESSMENT:  CLINICAL IMPRESSION: Patient arrived to session  following appointment with prosthetist who increased length of Rt AKA prosthesis by 0.5 inches which has assisted patient with appropriate fit and gait mechanics. Patient is showing improvements in overall abilities, but still has deficits with appropriate weight shifting onto Rt prosthesis, balance, and appropriate mechanics with curbs/ramps. Patient will benefit from continued skilled PT.  OBJECTIVE IMPAIRMENTS: Abnormal gait, decreased activity tolerance, decreased balance, decreased endurance, decreased knowledge of condition, decreased knowledge of use of DME, decreased mobility, difficulty walking, decreased ROM, decreased strength, postural dysfunction, prosthetic dependency , and pain.   ACTIVITY LIMITATIONS: carrying, lifting, sitting, standing, stairs, transfers, and locomotion level  PARTICIPATION LIMITATIONS: meal prep, cleaning, and community activity  PERSONAL FACTORS: Age, Past/current experiences, Time since onset of injury/illness/exacerbation, and 3+ comorbidities: see PMH are also affecting patient's functional outcome.   REHAB POTENTIAL: Good  CLINICAL DECISION MAKING: Evolving/moderate complexity  EVALUATION COMPLEXITY: Moderate   GOALS: Goals reviewed with patient? Yes  SHORT TERM GOALS: Target date: 03/30/2024  Patient donnes prosthesis modified independent correctly Baseline: SEE OBJECTIVE DATA Goal status: MET 03/30/2024 2.  Patient tolerates prosthesis wear daily for >12 hours without skin issues or limb pain over 5/10 Baseline: SEE OBJECTIVE DATA Goal status:  MET 03/30/2024  3.  Patient able to reach 7 and look over both shoulders without UE support with supervision. Baseline: SEE OBJECTIVE DATA Goal status:  MET 03/30/2024  4. Patient ambulates 200' with cruthes & prosthesis with supervision with prosthetic knee flexion >50% of steps for swing phase. Baseline: SEE OBJECTIVE DATA Goal  status:    MET 03/30/2024  5. Patient negotiates ramps & curbs with crutches & prosthesis with supervision. Baseline: SEE OBJECTIVE DATA Goal status:  MET 03/30/2024  LONG TERM GOALS: Target date: 05/27/2023  Patient demonstrates & verbalized understanding of prosthetic care to enable safe utilization of prosthesis. Baseline: SEE OBJECTIVE DATA Goal status: Ongoing   05/19/2024  Patient tolerates prosthesis wear >90% of awake hours without skin or limb pain >2/10 issues. Baseline: SEE OBJECTIVE DATA Goal status: Ongoing    05/19/2024  Berg Balance >36/56 to indicate lower fall risk Baseline: SEE OBJECTIVE DATA Goal status: Ongoing   05/19/2024  Patient ambulates >250' with prosthesis with cane or less independently Baseline: SEE OBJECTIVE DATA Goal status: Ongoing   05/19/2024  Patient negotiates ramps, curbs & stairs with single rail with prosthesis  and cane or less independently. Baseline: SEE OBJECTIVE DATA Goal status: Ongoing   05/19/2024  8.  Patient reports Patient-Specific Activity Score improved to average 8 to indicate improvement in functional activities.   Baseline: SEE OBJECTIVE DATA Goal status: Ongoing  05/19/2024  PLAN:  PT FREQUENCY: 2x/week  PT DURATION: 12 weeks  PLANNED INTERVENTIONS: 97164- PT Re-evaluation, 97750- Physical Performance Testing, 97110-Therapeutic exercises, 97530- Therapeutic activity, 97112- Neuromuscular re-education, [phone]- Self Care, 02883- Gait training, [phone]- Prosthetic Initial , [phone]- Orthotic/Prosthetic subsequent, Patient/Family education, Balance training, and Stair training  PLAN FOR NEXT SESSION:   continue with prosthetic gait with forearm crutches working on terminal stance & swing prosthetic knee flexion and weight shift over prosthesis in stance and neg ramps / curbs, postural strengthening/flexibility, balance activities    Susannah Daring, PT, DPT 05/19/24 3:28 PM     Date of referral: 02/08/2024 Referring provider:  Jerona Sage Referring diagnosis? Z89.611 (ICD-10-CM) - Hx of AKA (above knee amputation), right  Treatment diagnosis? (if different than referring diagnosis) R26.89, R29.6, R26.81, M62.81, M25.651  What was this (referring dx) caused by? Surgery (Type: right AKA) and [place] Hawks of Condition:  Recurrent (multiple episodes of < 3 months)   Laterality: Rt  Current Functional Measure Score:  Berg Balance 20/56  Objective measurements identify impairments when they are compared to normal values, the uninvolved extremity, and prior level of function.  [x]  Yes  []  No  Objective assessment of functional ability: Moderate functional limitations   Briefly describe symptoms:  patient is dependent in prosthetic care.  Patient has recurrent falls and Berg Balance 20/56 indicates high fall risk.  Patient has dependent prosthetic gait with deviations indicating high fall risk.    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  He is dependent in use especially donning suction ring suspension correctly.   Average pain intensity:  Last 24 hours: 7/10  Past week: 7/10  How often does the pt experience symptoms? Frequently  How much have the symptoms interfered with usual daily activities? Quite a bit  How has condition changed since care began at this facility? NA - initial visit  In general, how is the patients overall health? Good   BACK PAIN (STarT Back Screening Tool) No  "

## 2024-05-24 ENCOUNTER — Encounter: Admitting: Physical Therapy

## 2024-05-24 NOTE — Therapy (Incomplete)
 "  OUTPATIENT PHYSICAL THERAPY PROSTHETIC TREATMENT   Patient Name: Mark [name]. MRN: [id number] DOB:[birth year], 69 y.o., male Today's Date: 05/24/2024   END OF SESSION:        Past Medical History:  Diagnosis Date   Alcohol  abuse    Anemia    Arthritis    Ascites    Cirrhosis ([name])    Coffee ground emesis    Dehydration 06/17/2017   Febrile illness    GERD (gastroesophageal reflux disease)    Heart murmur    History of blood transfusion    Hyperlipidemia    Hypertension    Leg swelling    Myocardial infarction ([name]) 2012   Preop cardiovascular exam 04/14/2013   Sepsis ([name]) 06/17/2017   Septic shock ([name]) 06/18/2017   SIRS (systemic inflammatory response syndrome) ([name]) 07/11/2017   Stroke ([name]) 10/2009   TIA   Thrombocytopenia    Past Surgical History:  Procedure Laterality Date   AMPUTATION Right 11/07/2022   Procedure: RIGHT ABOVE KNEE AMPUTATION;  Surgeon: Harden Jerona GAILS, MD;  Location: [facility] OR;  Service: Orthopedics;  Laterality: Right;   COLONOSCOPY WITH PROPOFOL  N/A 02/07/2016   Procedure: COLONOSCOPY WITH PROPOFOL ;  Surgeon: Toribio SHAUNNA Cedar, MD;  Location: [facility];  Service: Endoscopy;  Laterality: N/A;   ERCP N/A 02/28/2023   Procedure: ENDOSCOPIC RETROGRADE CHOLANGIOPANCREATOGRAPHY (ERCP);  Surgeon: Rollin Dover, MD;  Location: [facility];  Service: Gastroenterology;  Laterality: N/A;   ESOPHAGOGASTRODUODENOSCOPY (EGD) WITH PROPOFOL  N/A 02/07/2016   Procedure: ESOPHAGOGASTRODUODENOSCOPY (EGD) WITH PROPOFOL ;  Surgeon: Toribio SHAUNNA Cedar, MD;  Location: [facility];  Service: Endoscopy;  Laterality: N/A;   ESOPHAGOGASTRODUODENOSCOPY (EGD) WITH PROPOFOL  N/A 06/22/2017   Procedure: ESOPHAGOGASTRODUODENOSCOPY (EGD) WITH PROPOFOL ;  Surgeon: Albertus Gordy HERO, MD;  Location: [facility] END[facility]COPY;  Service: Gastroenterology;  Laterality: N/A;   EXCISIONAL TOTAL KNEE ARTHROPLASTY WITH ANTIBIOTIC SPACERS Right 03/20/2020   Procedure: Resection right total knee  arthroplasty and placement of antibiotic spacer;  Surgeon: Ernie Cough, MD;  Location: [facility];  Service: Orthopedics;  Laterality: Right;  90 mins   HERNIA REPAIR Right    inguinal   INGUINAL HERNIA REPAIR Left 02/08/2021   Procedure: OPEN LEFT INGUINAL HERNIA REPAIR WITH MESH;  Surgeon: Vernetta Berg, MD;  Location: [facility] OR;  Service: General;  Laterality: Left;   IRRIGATION AND DEBRIDEMENT KNEE Right 11/13/2020   Procedure: IRRIGATION AND DEBRIDEMENT KNEE;  Surgeon: Ernie Cough, MD;  Location: [facility];  Service: Orthopedics;  Laterality: Right;   JOINT REPLACEMENT     KNEE ARTHROSCOPY     bilateral/  12/14   REIMPLANTATION OF TOTAL KNEE Right 08/02/2020   Procedure: REIMPLANTATION/REVISION OF TOTAL KNEE WITH REMOVAL OF ANTIBIOTIC SPACER;  Surgeon: Ernie Cough, MD;  Location: [facility];  Service: Orthopedics;  Laterality: Right;    REMOVAL OF STONES  02/28/2023   Procedure: REMOVAL OF DEBRIS;  Surgeon: Rollin Dover, MD;  Location: [facility];  Service: Gastroenterology;;   ANNETT  02/28/2023   Procedure: ANNETT;  Surgeon: Rollin Dover, MD;  Location: [facility];  Service: Gastroenterology;;   TEE WITHOUT CARDIOVERSION N/A 05/13/2018   Procedure: TRANSESOPHAGEAL ECHOCARDIOGRAM (TEE);  Surgeon: Loni Soyla LABOR, MD;  Location: [facility] ENDOSCOPY;  Service: Cardiovascular;  Laterality: N/A;   TOTAL KNEE ARTHROPLASTY Right 05/02/2013   Procedure: RIGHT TOTAL KNEE ARTHROPLASTY;  Surgeon: Cough JONETTA Ernie, MD;  Location: [facility];  Service: Orthopedics;  Laterality: Right;   Patient Active Problem List   Diagnosis Date Noted   Acute blood loss anemia  03/02/2023   Choledocholithiasis 02/28/2023   HTN (hypertension) 02/28/2023   Hypokalemia 02/28/2023   Cholelithiasis with biliary obstruction 02/27/2023   Infection of total knee replacement 11/07/2022   Hx of AKA (above knee amputation), right ([name]) 11/07/2022   Cerebrovascular disease 07/08/2022   Renal cyst 07/08/2022    Protein-calorie malnutrition, severe 07/08/2022   Toxic metabolic encephalopathy 02/26/2022   Septic joint of right knee joint ([name]) 11/08/2020   S/P right TKA reimplantation 08/02/2020   Infection of total left knee replacement 03/20/2020   Cirrhosis of liver without ascites ([name])    AKI (acute kidney injury)    Bacteremia    Spinal stenosis of lumbar region without neurogenic claudication    Aspiration pneumonia ([name]) 09/24/2018   Abscess in epidural space of lumbar spine    MRSA bacteremia 09/21/2018   Infection of prosthetic right knee joint 09/20/2018   Anemia of chronic disease 09/20/2018   Hypoalbuminemia 09/20/2018   Hypoglycemia without diagnosis of diabetes mellitus 09/20/2018   Epidural abscess 09/20/2018   Hyperkalemia 05/18/2018   Edentulous 05/11/2018   Pancytopenia ([name]) 05/10/2018   CAD (coronary artery disease) 05/10/2018   Hepatic encephalopathy ([name]) 05/10/2018   Alcohol  abuse 05/10/2018   GERD (gastroesophageal reflux disease) 05/10/2018   Duodenal ulcer    Renal failure    Acute on chronic anemia    Hypotension 06/17/2017   Hyponatremia 06/17/2017   Leg edema, right 06/17/2017   Acute metabolic encephalopathy 06/17/2017   Anemia, iron  deficiency    Benign neoplasm of ascending colon    Hemorrhoids    Portal hypertensive gastropathy ([name])    Gastritis and gastroduodenitis    Esophageal varices without bleeding ([name])    H/O: CVA (cerebrovascular accident) 12/01/2013   ACS (acute coronary syndrome) ([name]) 12/01/2013   Anasarca 12/01/2013   Polysubstance abuse ([name]) 12/01/2013   CKD (chronic kidney disease) stage 3, GFR 30-59 ml/min ([name]) 12/01/2013   S/P right TKA 05/02/2013   Murmur 04/14/2013   Right inguinal hernia 12/26/2010   Cirrhosis, alcoholic ([name]) 12/20/2010    PCP: Mercer Clotilda SAUNDERS, MD  REFERRING PROVIDER: Harden Jerona GAILS, MD  ONSET DATE: 02/08/2024 MD referral to PT  REFERRING DIAG: Z89.611 (ICD-10-CM) - Hx of AKA (above knee  amputation), right   THERAPY DIAG:  No diagnosis found.  Rationale for Evaluation and Treatment: Rehabilitation  SUBJECTIVE:   SUBJECTIVE STATEMENT: ***  Patient reports that the prosthetist put half an inch on the prosthesis. Patient reports that he can tell a positive difference and that it is helping him stand straighter with the adjustments that were made.    PERTINENT HISTORY: right TFA 11/07/22, inguinal hernia repair BLEs, ETOH abuse, cirrhosis, arthritis, GERD, HTN, HLD, heart murmur, MI, TIA 2011, systemic inflammatory response syndrome   Patient underwent a right Transfemoral Amputation on 11/07/2022 due to infected TKA. Initial right TKA 2015 with antibiotic spacers 2021 & 2022 with I&D. He got prosthesis on 02/10/2023. He had got new socket in April 2025.  The prosthesis slides off some times.  He is wearing prosthesis daily most of awake hours except if he is staying home.     PAIN:  NPRS scale: not rated this date  Pain location: right residual limb Pain description: soreness Aggravating factors: unknown Relieving factors: it goes away on its own  PRECAUTIONS: Fall  WEIGHT BEARING RESTRICTIONS: No  FALLS:  Has patient fallen in last 6 months? Yes. Number of falls >4 x/month. He denies injuries.  If prosthesis twist and catches prosthesis  going up steps.   LIVING ENVIRONMENT: Lives with: lives with their spouse and dog 60#  Lives in: House Home Access: Stairs to enter Home layout: One level Stairs: Yes: External: 3 steps to porch without rails and threshold into house Has following equipment at home: Single point cane, Environmental Consultant - 2 wheeled, Chief Operating Officer, Wheelchair (manual), and Tour manager  OCCUPATION:  retired does odd jobs lobbyist, estate manager/land agent, cut wood   PLOF: Independent  PATIENT GOALS:   He wants to walk   Next MD visit:  OBJECTIVE:   PATIENT SURVEYS:  Patient-Specific Activity Scoring Scheme  0 represents unable to perform. 10  represents able to perform at prior level. 0 1 2 3 4 5 6 7 8 9  10 (Date and Score)  Activity Eval  05/19/2024   1.   Standing ADLs   8 7   2.  Walking with prosthesis  4  7  3.  Repair cars, yard work, chop wood 6 5  4.    5.    Score 6 6.33   Total score = sum of the activity scores/number of activities Minimum detectable change (90%CI) for average score = 2 points Minimum detectable change (90%CI) for single activity score = 3 points  COGNITION: Overall cognitive status: WFL    SENSATION: WFL  MUSCLE LENGTH: Evaluation on 02/29/2024: Debby test: Right -25 deg;  POSTURE:  Evaluation on 02/29/2024: rounded shoulders, forward head, flexed trunk , and weight shift left  LOWER EXTREMITY ROM:   ROM Left eval  Hip flexion   Hip extension -12*  Hip abduction   Hip adduction   Hip internal rotation   Hip external rotation   Knee flexion   Knee extension   Ankle dorsiflexion   Ankle plantarflexion   Ankle inversion   Ankle eversion    (Blank rows = not tested)  LOWER EXTREMITY MMT:  MMT Left eval  Hip flexion   Hip extension 4/5  Hip abduction 4/5  Hip adduction   Hip internal rotation   Hip external rotation   Knee flexion   Knee extension   Ankle dorsiflexion   Ankle plantarflexion   Ankle inversion   Ankle eversion    (Blank rows = not tested)  TRANSFERS: Evaluation on 02/29/2024: Sit to stand: SBA from 18 chair with armrest uses back of legs against chair to stabilize and requires external support of crutches to position lower extremities for stabilization. Stand to sit: SBA requires use of armrest to control descent to 18 chair  FUNCTIONAL TESTs:  03/30/2024: Patient able to reach anteriorly 7 and look over shoulders without upper extremity support with supervision safely.  Evaluation on 02/29/2024: Berg Balance 20/56  [facility] PT Assessment - 02/29/24 1300       Standardized Balance Assessment   Standardized Balance Assessment Berg Balance Test       Berg Balance Test   Sit to Stand Needs minimal aid to stand or to stabilize    Standing Unsupported Able to stand 2 minutes with supervision    Sitting with Back Unsupported but Feet Supported on Floor or Stool Able to sit safely and securely 2 minutes    Stand to Sit Controls descent by using hands    Transfers Able to transfer safely, definite need of hands    Standing Unsupported with Eyes Closed Unable to keep eyes closed 3 seconds but stays steady    Standing Unsupported with Feet Together Needs help to attain position but able to stand for  30 seconds with feet together    From Standing, Reach Forward with Outstretched Arm Reaches forward but needs supervision    From Standing Position, Pick up Object from Floor Unable to pick up and needs supervision    From Standing Position, Turn to Look Behind Over each Shoulder Needs supervision when turning    Turn 360 Degrees Needs assistance while turning    Standing Unsupported, Alternately Place Feet on Step/Stool Needs assistance to keep from falling or unable to try    Standing Unsupported, One Foot in [employer] balance while stepping or standing    Standing on One Leg Tries to lift leg/unable to hold 3 seconds but remains standing independently    Total Score 20    Berg comment: BERG  < 36 high risk for falls (close to 100%) 46-51 moderate (>50%)   37-45 significant (>80%) 52-55 lower (> 25%)          [facility] PT Assessment - 05/19/24 0001       Berg Balance Test   Sit to Stand Able to stand  independently using hands    Standing Unsupported Able to stand 2 minutes with supervision    Sitting with Back Unsupported but Feet Supported on Floor or Stool Able to sit safely and securely 2 minutes    Stand to Sit Controls descent by using hands    Transfers Able to transfer safely, definite need of hands    Standing Unsupported with Eyes Closed Able to stand 10 seconds with supervision    Standing Unsupported with Feet Together Needs  help to attain position and unable to hold for 15 seconds    From Standing, Reach Forward with Outstretched Arm Can reach forward >12 cm safely (5)    From Standing Position, Pick up Object from Floor Able to pick up shoe, needs supervision    From Standing Position, Turn to Look Behind Over each Shoulder Turn sideways only but maintains balance    Turn 360 Degrees Needs close supervision or verbal cueing    Standing Unsupported, Alternately Place Feet on Step/Stool Needs assistance to keep from falling or unable to try    Standing Unsupported, One Foot in Front Able to take small step independently and hold 30 seconds    Standing on One Leg Able to lift leg independently and hold equal to or more than 3 seconds    Total Score 32    Berg comment: BERG  < 36 high risk for falls (close to 100%) 46-51 moderate (>50%)   37-45 significant (>80%) 52-55 lower (> 25%)           GAIT: 03/30/2024: Patient ambulates 200 feet with forearm crutches 4-point gait pattern with prosthetic knee flexion approximately 50% of the time with supervision/PT cues. Patient negotiates ramp and curb with forearm crutches with PT supervision/verbal cues on technique.  Evaluation on 02/29/2024: Gait pattern: step to pattern, decreased step length- Left, decreased stance time- Right, decreased hip/knee flexion- Right, circumduction- Right, Right hip hike, antalgic, trunk flexed, and abducted- Right patient is fearful to unlock his prosthesis for swing phase. Pistoning noted with prosthesis due to suspension issue. Distance walked: 120' Assistive device utilized: Crutches and transfemoral prosthesis Level of assistance: SBA only when using crutches.  He would require greater assistance if he was using a cane or no device.  Gait velocity: 1.37 ft/sec Comments: Excessive weightbearing on axillary crutches  CURRENT PROSTHETIC WEAR ASSESSMENT: 03/30/2024: Patient reports wearing prosthesis greater than 12 hours/day with  limb pain less than 5/10. Patient verbalizes proper donning of prosthesis.  Evaluation on 02/29/2024: Patient is independent with: skin check Patient is dependent with: prosthetic cleaning, correct ply sock adjustment, and proper wear schedule/adjustment Donning prosthesis: SBA patient needs cues for new suspension system with suction ring Doffing prosthesis: Modified independence Prosthetic wear tolerance: Patient reports wearing prosthesis most of awake hours on days that he is leaving his home.  On days that he stays home he reports not wearing the prosthesis at all. Prosthetic weight bearing tolerance: 10 minutes with no increase in residual limb pain reported Edema: None Residual limb condition: No open areas, cylindrical shape, normal temperature and moisture level.  Prosthetic description: Silicone liner with suction ring suspension, ischial containment socket with flexible inner socket.  Multiaxial knee.   Gait velocity: 05/19/2024 performed with SUP and bilat lofstrand crutches  Self-selected: 0.75 ft/sec Fast as comfortable: 2.22 ft/sec   TODAY'S TREATMENT:                                                                                                                             DATE:  05/24/2024: Prosthetic Care with transfemoral prosthesis: ***  PT discussed progress and plan of care with patient.  Patient performed ascending/descending ramp and curb with SBA-CGA and bilat lofstrand crutches while also requiring verbal cues for ascending curb.     TREATMENT:                                                                                                                             DATE:  05/19/2024: Prosthetic Care with transfemoral prosthesis: PT discussed progress and plan of care with patient.  Patient performed ascending/descending ramp and curb with SBA-CGA and bilat lofstrand crutches while also requiring verbal cues for ascending curb.   Physical Performance:  BERG and  gait velocity performed with results noted above and discussed with patient.   TREATMENT:  DATE:  05/17/2024: Prosthetic Care with transfemoral prosthesis: PT worked in parallel bars with tactile, verbal, visual (mirror) and demo cues on upright posture (not staring at floor), step length with heel passing contralateral toes, weight shift over prosthesis in stance and step width.   Pt amb 100' & 140' with forearm crutches with carryover of above.   At prosthetist appt tomorrow for height check, PT verbal cues that he needs to stand upright trunk, even weight on feet and make sure Left knee is extended.  When he ambulates for prosthetist to do alignment, try to carryover today's gait recommendations.  Pt verbalized understanding.    TREATMENT:                                                                                                                             DATE:  05/12/2024: Prosthetic Care with transfemoral prosthesis: Patient ambulated 3x125' with bilat lofstrand crutches and SUP>SBA with verbal cues from PT. Patient ambulated using 4-point technique, though PT attempted to transition to 2-point technique with bilat lofstrand crutches. Patient unable to appropriately perform throughout session without consistent verbal cues, several standing breaks to discuss technique, and seated rest breaks between in order to verbalize technique without dual tasking. Though patient had difficulty with new ambulation technique, patient was able to increase time with forward gaze and intermittent Rt knee flexion with terminal stance. PT discussed importance of visualizing and verbalizing the 2-point technique while at home in order to increase brain-body connection and carryover into performance.   HOME EXERCISE PROGRAM: Access Code: [email or web address] URL:  [email or web address] Date: 03/14/2024 Prepared by: Grayce Spatz  Exercises - Standing posture with back to counter  - 1-3 x daily - 7 x weekly - 1 sets - 1 reps - 2-5 minutes hold - standing back extension  - 1-3 x daily - 7 x weekly - 1 sets - 10 reps - 5 seconds hold - Modified Thomas Stretch  - 1-3 x daily - 7 x weekly - 1 sets - 3 reps - 30 seconds hold   ASSESSMENT:  CLINICAL IMPRESSION: ***  Patient arrived to session following appointment with prosthetist who increased length of Rt AKA prosthesis by 0.5 inches which has assisted patient with appropriate fit and gait mechanics. Patient is showing improvements in overall abilities, but still has deficits with appropriate weight shifting onto Rt prosthesis, balance, and appropriate mechanics with curbs/ramps. Patient will benefit from continued skilled PT.  OBJECTIVE IMPAIRMENTS: Abnormal gait, decreased activity tolerance, decreased balance, decreased endurance, decreased knowledge of condition, decreased knowledge of use of DME, decreased mobility, difficulty walking, decreased ROM, decreased strength, postural dysfunction, prosthetic dependency , and pain.   ACTIVITY LIMITATIONS: carrying, lifting, sitting, standing, stairs, transfers, and locomotion level  PARTICIPATION LIMITATIONS: meal prep, cleaning, and community activity  PERSONAL FACTORS: Age, Past/current experiences, Time since onset of injury/illness/exacerbation, and 3+ comorbidities: see PMH are also affecting patient's functional outcome.   REHAB POTENTIAL: Good  CLINICAL DECISION MAKING:  Evolving/moderate complexity  EVALUATION COMPLEXITY: Moderate   GOALS: Goals reviewed with patient? Yes  SHORT TERM GOALS: Target date: 03/30/2024  Patient donnes prosthesis modified independent correctly Baseline: SEE OBJECTIVE DATA Goal status: MET 03/30/2024 2.  Patient tolerates prosthesis wear daily for >12 hours without skin issues or limb pain over  5/10 Baseline: SEE OBJECTIVE DATA Goal status:  MET 03/30/2024  3.  Patient able to reach 7 and look over both shoulders without UE support with supervision. Baseline: SEE OBJECTIVE DATA Goal status:  MET 03/30/2024  4. Patient ambulates 200' with cruthes & prosthesis with supervision with prosthetic knee flexion >50% of steps for swing phase. Baseline: SEE OBJECTIVE DATA Goal status:    MET 03/30/2024  5. Patient negotiates ramps & curbs with crutches & prosthesis with supervision. Baseline: SEE OBJECTIVE DATA Goal status:  MET 03/30/2024  LONG TERM GOALS: Target date: 05/27/2023  Patient demonstrates & verbalized understanding of prosthetic care to enable safe utilization of prosthesis. Baseline: SEE OBJECTIVE DATA Goal status: Ongoing  05/24/2024  Patient tolerates prosthesis wear >90% of awake hours without skin or limb pain >2/10 issues. Baseline: SEE OBJECTIVE DATA Goal status: Ongoing   05/24/2024  Berg Balance >36/56 to indicate lower fall risk Baseline: SEE OBJECTIVE DATA Goal status: Ongoing  05/24/2024  Patient ambulates >250' with prosthesis with cane or less independently Baseline: SEE OBJECTIVE DATA Goal status: Ongoing  05/24/2024  Patient negotiates ramps, curbs & stairs with single rail with prosthesis  and cane or less independently. Baseline: SEE OBJECTIVE DATA Goal status: Ongoing  05/24/2024  6.  Patient reports Patient-Specific Activity Score improved to average 8 to indicate improvement in functional activities.   Baseline: SEE OBJECTIVE DATA Goal status: Ongoing  05/24/2024  PLAN:  PT FREQUENCY: 2x/week  PT DURATION: 12 weeks  PLANNED INTERVENTIONS: 97164- PT Re-evaluation, 97750- Physical Performance Testing, 97110-Therapeutic exercises, 97530- Therapeutic activity, 97112- Neuromuscular re-education, [phone]- Self Care, 02883- Gait training, [phone]- Prosthetic Initial , [phone]- Orthotic/Prosthetic subsequent, Patient/Family education, Balance training, and  Stair training  PLAN FOR NEXT SESSION:  *** continue with prosthetic gait with forearm crutches working on terminal stance & swing prosthetic knee flexion and weight shift over prosthesis in stance and neg ramps / curbs, postural strengthening/flexibility, balance activities    Grayce Spatz, PT, DPT 05/24/2024, 9:36 AM     Date of referral: 02/08/2024 Referring provider: Jerona Sage Referring diagnosis? Z89.611 (ICD-10-CM) - Hx of AKA (above knee amputation), right  Treatment diagnosis? (if different than referring diagnosis) R26.89, R29.6, R26.81, M62.81, M25.651  What was this (referring dx) caused by? Surgery (Type: right AKA) and [place] Hawks of Condition: Recurrent (multiple episodes of < 3 months)   Laterality: Rt  Current Functional Measure Score:  Berg Balance 20/56  Objective measurements identify impairments when they are compared to normal values, the uninvolved extremity, and prior level of function.  [x]  Yes  []  No  Objective assessment of functional ability: Moderate functional limitations   Briefly describe symptoms:  patient is dependent in prosthetic care.  Patient has recurrent falls and Berg Balance 20/56 indicates high fall risk.  Patient has dependent prosthetic gait with deviations indicating high fall risk.    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  He is dependent in use especially donning suction ring suspension correctly.   Average pain intensity:  Last 24 hours: 7/10  Past week: 7/10  How often does the pt experience symptoms? Frequently  How much have  the symptoms interfered with usual daily activities? Quite a bit  How has condition changed since care began at this facility? NA - initial visit  In general, how is the patients overall health? Good   BACK PAIN (STarT Back Screening Tool) No  "

## 2024-05-26 ENCOUNTER — Encounter

## 2024-06-08 ENCOUNTER — Encounter: Admitting: Physical Therapy
